# Patient Record
Sex: Male | Born: 1965 | Race: White | Hispanic: No | Marital: Married | Smoking: Never smoker
Health system: Southern US, Community
[De-identification: ages and names within clinical notes are randomized; demographics above are authoritative.]

## PROBLEM LIST (undated history)

## (undated) DIAGNOSIS — J45909 Unspecified asthma, uncomplicated: Secondary | ICD-10-CM

## (undated) DIAGNOSIS — D179 Benign lipomatous neoplasm, unspecified: Secondary | ICD-10-CM

## (undated) DIAGNOSIS — C189 Malignant neoplasm of colon, unspecified: Secondary | ICD-10-CM

## (undated) DIAGNOSIS — Z8719 Personal history of other diseases of the digestive system: Secondary | ICD-10-CM

## (undated) DIAGNOSIS — I340 Nonrheumatic mitral (valve) insufficiency: Secondary | ICD-10-CM

## (undated) DIAGNOSIS — J189 Pneumonia, unspecified organism: Secondary | ICD-10-CM

## (undated) DIAGNOSIS — Z9889 Other specified postprocedural states: Secondary | ICD-10-CM

## (undated) DIAGNOSIS — IMO0002 Reserved for concepts with insufficient information to code with codable children: Secondary | ICD-10-CM

## (undated) DIAGNOSIS — Z800 Family history of malignant neoplasm of digestive organs: Secondary | ICD-10-CM

## (undated) DIAGNOSIS — Z808 Family history of malignant neoplasm of other organs or systems: Secondary | ICD-10-CM

## (undated) DIAGNOSIS — Q600 Renal agenesis, unilateral: Secondary | ICD-10-CM

## (undated) DIAGNOSIS — J309 Allergic rhinitis, unspecified: Secondary | ICD-10-CM

## (undated) DIAGNOSIS — I10 Essential (primary) hypertension: Secondary | ICD-10-CM

## (undated) DIAGNOSIS — D1803 Hemangioma of intra-abdominal structures: Secondary | ICD-10-CM

## (undated) DIAGNOSIS — Z8739 Personal history of other diseases of the musculoskeletal system and connective tissue: Secondary | ICD-10-CM

## (undated) DIAGNOSIS — E785 Hyperlipidemia, unspecified: Secondary | ICD-10-CM

## (undated) DIAGNOSIS — R112 Nausea with vomiting, unspecified: Secondary | ICD-10-CM

## (undated) DIAGNOSIS — C649 Malignant neoplasm of unspecified kidney, except renal pelvis: Secondary | ICD-10-CM

## (undated) DIAGNOSIS — M4647 Discitis, unspecified, lumbosacral region: Secondary | ICD-10-CM

## (undated) DIAGNOSIS — I058 Other rheumatic mitral valve diseases: Secondary | ICD-10-CM

## (undated) DIAGNOSIS — I059 Rheumatic mitral valve disease, unspecified: Secondary | ICD-10-CM

## (undated) DIAGNOSIS — E119 Type 2 diabetes mellitus without complications: Secondary | ICD-10-CM

## (undated) HISTORY — DX: Essential (primary) hypertension: I10

## (undated) HISTORY — PX: THYROID SURGERY: SHX805

## (undated) HISTORY — DX: Benign lipomatous neoplasm, unspecified: D17.9

## (undated) HISTORY — PX: PORT-A-CATH REMOVAL: SHX5289

## (undated) HISTORY — PX: TONSILLECTOMY: SUR1361

## (undated) HISTORY — PX: TOTAL NEPHRECTOMY: SHX415

## (undated) HISTORY — DX: Nonrheumatic mitral (valve) insufficiency: I34.0

## (undated) HISTORY — PX: LIPOMA EXCISION: SHX5283

## (undated) HISTORY — DX: Family history of malignant neoplasm of other organs or systems: Z80.8

## (undated) HISTORY — DX: Family history of malignant neoplasm of digestive organs: Z80.0

## (undated) HISTORY — DX: Personal history of other diseases of the musculoskeletal system and connective tissue: Z87.39

## (undated) HISTORY — DX: Type 2 diabetes mellitus without complications: E11.9

## (undated) HISTORY — PX: KNEE SURGERY: SHX244

## (undated) HISTORY — DX: Hyperlipidemia, unspecified: E78.5

## (undated) HISTORY — DX: Allergic rhinitis, unspecified: J30.9

---
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
## 2004-07-15 ENCOUNTER — Ambulatory Visit: Payer: Self-pay | Admitting: Internal Medicine

## 2004-07-24 ENCOUNTER — Ambulatory Visit: Payer: Self-pay

## 2004-07-24 ENCOUNTER — Ambulatory Visit: Payer: Self-pay | Admitting: Internal Medicine

## 2004-09-06 ENCOUNTER — Ambulatory Visit: Payer: Self-pay | Admitting: Internal Medicine

## 2004-11-29 ENCOUNTER — Ambulatory Visit: Payer: Self-pay | Admitting: Internal Medicine

## 2004-12-05 ENCOUNTER — Ambulatory Visit: Payer: Self-pay | Admitting: Internal Medicine

## 2005-02-26 ENCOUNTER — Ambulatory Visit: Payer: Self-pay | Admitting: Internal Medicine

## 2005-03-05 ENCOUNTER — Ambulatory Visit: Payer: Self-pay | Admitting: Internal Medicine

## 2005-06-10 ENCOUNTER — Ambulatory Visit: Payer: Self-pay | Admitting: Internal Medicine

## 2005-06-13 ENCOUNTER — Ambulatory Visit: Payer: Self-pay | Admitting: Internal Medicine

## 2005-12-23 ENCOUNTER — Ambulatory Visit: Payer: Self-pay | Admitting: Internal Medicine

## 2006-06-05 ENCOUNTER — Ambulatory Visit: Payer: Self-pay | Admitting: Internal Medicine

## 2006-06-05 LAB — CONVERTED CEMR LAB
ALT: 46 units/L — ABNORMAL HIGH (ref 0–40)
AST: 34 units/L (ref 0–37)
Alkaline Phosphatase: 44 units/L (ref 39–117)
Bilirubin, Direct: 0.2 mg/dL (ref 0.0–0.3)
CRP, High Sensitivity: <1 — ABNORMAL LOW (ref 0.00–5.00)
Calcium: 9.3 mg/dL (ref 8.4–10.5)
Chloride: 100 meq/L (ref 96–112)
Cholesterol: 115 mg/dL (ref 0–200)
Creatinine, Ser: 1.2 mg/dL (ref 0.4–1.5)
GFR calc Af Amer: 87 mL/min
GFR calc non Af Amer: 72 mL/min
Glucose, Bld: 103 mg/dL — ABNORMAL HIGH (ref 70–99)
Hgb A1c MFr Bld: 6.1 % — ABNORMAL HIGH (ref 4.6–6.0)
LDL Cholesterol: 66 mg/dL (ref 0–99)
Potassium: 4.4 meq/L (ref 3.5–5.1)
Sodium: 138 meq/L (ref 135–145)
Total Bilirubin: 1.5 mg/dL — ABNORMAL HIGH (ref 0.3–1.2)
Total Protein: 7 g/dL (ref 6.0–8.3)
Triglycerides: 58 mg/dL (ref 0–149)

## 2006-06-15 ENCOUNTER — Ambulatory Visit: Payer: Self-pay | Admitting: Internal Medicine

## 2006-11-10 ENCOUNTER — Ambulatory Visit: Payer: Self-pay | Admitting: Internal Medicine

## 2006-11-10 LAB — CONVERTED CEMR LAB
Calcium: 9 mg/dL (ref 8.4–10.5)
Cholesterol: 131 mg/dL (ref 0–200)
Creatinine, Ser: 1.1 mg/dL (ref 0.4–1.5)
GFR calc Af Amer: 96 mL/min
GFR calc non Af Amer: 79 mL/min
HDL: 41.7 mg/dL (ref >39.0)
Hgb A1c MFr Bld: 5.6 % (ref 4.6–6.0)
Total Bilirubin: 1.4 mg/dL — ABNORMAL HIGH (ref 0.3–1.2)
VLDL: 10 mg/dL (ref 0–40)

## 2006-12-07 ENCOUNTER — Ambulatory Visit: Payer: Self-pay | Admitting: Internal Medicine

## 2007-03-02 ENCOUNTER — Encounter: Admission: RE | Admit: 2007-03-02 | Discharge: 2007-03-02 | Payer: Self-pay | Admitting: Family Medicine

## 2007-05-26 ENCOUNTER — Ambulatory Visit: Payer: Self-pay | Admitting: Internal Medicine

## 2007-05-26 LAB — CONVERTED CEMR LAB
ALT: 36 units/L (ref 0–53)
AST: 28 units/L (ref 0–37)
Albumin: 4 g/dL (ref 3.5–5.2)
Alkaline Phosphatase: 42 units/L (ref 39–117)
Bilirubin, Direct: 0.2 mg/dL (ref 0.0–0.3)
CO2: 31 meq/L (ref 19–32)
Calcium: 9.2 mg/dL (ref 8.4–10.5)
Chloride: 101 meq/L (ref 96–112)
Creatinine, Ser: 1.3 mg/dL (ref 0.4–1.5)
GFR calc Af Amer: 79 mL/min
GFR calc non Af Amer: 65 mL/min
Glucose, Bld: 97 mg/dL (ref 70–99)
LDL Cholesterol: 74 mg/dL (ref 0–99)
Sodium: 137 meq/L (ref 135–145)
Total Protein: 7.1 g/dL (ref 6.0–8.3)

## 2007-06-01 ENCOUNTER — Ambulatory Visit: Payer: Self-pay | Admitting: Internal Medicine

## 2007-11-04 ENCOUNTER — Encounter: Admission: RE | Admit: 2007-11-04 | Discharge: 2007-11-04 | Payer: Self-pay | Admitting: General Surgery

## 2008-01-14 ENCOUNTER — Ambulatory Visit: Payer: Self-pay | Admitting: Internal Medicine

## 2008-01-14 LAB — CONVERTED CEMR LAB
ALT: 36 units/L (ref 0–53)
AST: 28 units/L (ref 0–37)
Chloride: 103 meq/L (ref 96–112)
Cholesterol: 134 mg/dL (ref 0–200)
Glucose, Bld: 115 mg/dL — ABNORMAL HIGH (ref 70–99)
HDL: 38.4 mg/dL — ABNORMAL LOW (ref >39.0)
Hgb A1c MFr Bld: 6.2 % — ABNORMAL HIGH (ref 4.6–6.0)
Total Protein: 6.8 g/dL (ref 6.0–8.3)
VLDL: 13 mg/dL (ref 0–40)

## 2008-01-24 ENCOUNTER — Ambulatory Visit: Payer: Self-pay | Admitting: Internal Medicine

## 2008-03-12 DIAGNOSIS — R002 Palpitations: Secondary | ICD-10-CM | POA: Insufficient documentation

## 2008-03-12 DIAGNOSIS — I10 Essential (primary) hypertension: Secondary | ICD-10-CM | POA: Insufficient documentation

## 2008-08-15 ENCOUNTER — Ambulatory Visit: Payer: Self-pay | Admitting: Internal Medicine

## 2008-08-15 LAB — CONVERTED CEMR LAB
Albumin: 4 g/dL (ref 3.5–5.2)
Alkaline Phosphatase: 40 units/L (ref 39–117)
Bilirubin, Direct: 0.2 mg/dL (ref 0.0–0.3)
CO2: 32 meq/L (ref 19–32)
Calcium: 9.2 mg/dL (ref 8.4–10.5)
Chloride: 102 meq/L (ref 96–112)
Cholesterol: 128 mg/dL (ref 0–200)
Creatinine, Ser: 1.2 mg/dL (ref 0.4–1.5)
Hgb A1c MFr Bld: 6.3 % (ref 4.6–6.5)
LDL Cholesterol: 74 mg/dL (ref 0–99)
Potassium: 4.2 meq/L (ref 3.5–5.1)
Total Bilirubin: 1.5 mg/dL — ABNORMAL HIGH (ref 0.3–1.2)
Triglycerides: 88 mg/dL (ref 0.0–149.0)

## 2008-08-24 ENCOUNTER — Encounter (INDEPENDENT_AMBULATORY_CARE_PROVIDER_SITE_OTHER): Payer: Self-pay | Admitting: *Deleted

## 2008-08-30 ENCOUNTER — Encounter: Payer: Self-pay | Admitting: Internal Medicine

## 2008-08-31 ENCOUNTER — Ambulatory Visit: Payer: Self-pay | Admitting: Internal Medicine

## 2008-11-03 ENCOUNTER — Encounter: Admission: RE | Admit: 2008-11-03 | Discharge: 2008-11-03 | Payer: Self-pay | Admitting: General Surgery

## 2008-11-28 ENCOUNTER — Other Ambulatory Visit: Admission: RE | Admit: 2008-11-28 | Discharge: 2008-11-28 | Payer: Self-pay | Admitting: Interventional Radiology

## 2008-11-28 ENCOUNTER — Encounter: Admission: RE | Admit: 2008-11-28 | Discharge: 2008-11-28 | Payer: Self-pay | Admitting: General Surgery

## 2008-11-28 ENCOUNTER — Encounter (INDEPENDENT_AMBULATORY_CARE_PROVIDER_SITE_OTHER): Payer: Self-pay | Admitting: Interventional Radiology

## 2008-11-30 ENCOUNTER — Telehealth: Payer: Self-pay | Admitting: Internal Medicine

## 2008-12-13 ENCOUNTER — Ambulatory Visit: Payer: Self-pay

## 2008-12-13 ENCOUNTER — Ambulatory Visit: Payer: Self-pay | Admitting: Internal Medicine

## 2009-01-05 ENCOUNTER — Encounter (INDEPENDENT_AMBULATORY_CARE_PROVIDER_SITE_OTHER): Payer: Self-pay | Admitting: General Surgery

## 2009-01-05 ENCOUNTER — Ambulatory Visit (HOSPITAL_COMMUNITY): Admission: RE | Admit: 2009-01-05 | Discharge: 2009-01-06 | Payer: Self-pay | Admitting: General Surgery

## 2009-01-05 HISTORY — PX: THYROIDECTOMY, PARTIAL: SHX18

## 2009-04-24 ENCOUNTER — Ambulatory Visit: Payer: Self-pay | Admitting: Internal Medicine

## 2009-04-27 ENCOUNTER — Ambulatory Visit: Payer: Self-pay | Admitting: Internal Medicine

## 2009-04-27 LAB — CONVERTED CEMR LAB
Albumin: 3.9 g/dL (ref 3.5–5.2)
Alkaline Phosphatase: 42 units/L (ref 39–117)
Chloride: 102 meq/L (ref 96–112)
Cholesterol: 139 mg/dL (ref 0–200)
GFR calc non Af Amer: 70.63 mL/min (ref >60)
Glucose, Bld: 122 mg/dL — ABNORMAL HIGH (ref 70–99)
HDL: 45.1 mg/dL (ref >39.00)
Hgb A1c MFr Bld: 6.4 % (ref 4.6–6.5)
LDL Cholesterol: 80 mg/dL (ref 0–99)
Potassium: 4.1 meq/L (ref 3.5–5.1)
Sodium: 138 meq/L (ref 135–145)
Total Bilirubin: 1.8 mg/dL — ABNORMAL HIGH (ref 0.3–1.2)
Triglycerides: 69 mg/dL (ref 0.0–149.0)

## 2010-04-30 NOTE — Letter (Signed)
Summary: [facility] - Lipid  [facility], Main Office  [address]   [place], [place] [address]   Phone: [phone]  Fax: [phone]     April 27, 2009 MRN: [id number]   [name] [address] [place], [place]  [address]   Dear [name],  We have reviewed your cholesterol results.  They are as follows:     Total Cholesterol:    139 (Desirable: less than 200)       HDL  Cholesterol:     45.10  (Desirable: greater than 40 for men and 50 for women)       LDL Cholesterol:       80  (Desirable: less than 100 for low risk and less than 70 for moderate to high risk)       Triglycerides:       69.0  (Desirable: less than 150)  Our recommendations include:  Looks good conintue current medications   Call our office at the number listed above if you have any questions.  Lowering your LDL cholesterol is important, but it is only one of a large number of "risk factors" that may indicate that you are at risk for heart disease, stroke or other complications of hardening of the arteries.  Other risk factors include:   A.  Cigarette Smoking* B.  High Blood Pressure* C.  Obesity* D.   Low HDL Cholesterol (see yours above)* E.   Diabetes Mellitus (higher risk if your is uncontrolled) F.  Family history of premature heart disease G.  Previous history of stroke or cardiovascular disease    *These are risk factors YOU HAVE CONTROL OVER.  For more information, visit .  There is now evidence that lowering the TOTAL CHOLESTEROL AND LDL CHOLESTEROL can reduce the risk of heart disease.  The American Heart Association recommends the following guidelines for the treatment of elevated cholesterol:  1.  If there is now current heart disease and less than two risk factors, TOTAL CHOLESTEROL should be less than 200 and LDL CHOLESTEROL should be less than 100. 2.  If there is current heart disease or two or more risk factors, TOTAL CHOLESTEROL should be less than 200 and  LDL CHOLESTEROL should be less than 70.  A diet low in cholesterol, saturated fat, and calories is the cornerstone of treatment for elevated cholesterol.  Cessation of smoking and exercise are also important in the management of elevated cholesterol and preventing vascular disease.  Studies have shown that 30 to 60 minutes of physical activity most days can help lower blood pressure, lower cholesterol, and keep your weight at a healthy level.  Drug therapy is used when cholesterol levels do not respond to therapeutic lifestyle changes (smoking cessation, diet, and exercise) and remains unacceptably high.  If medication is started, it is important to have you levels checked periodically to evaluate the need for further treatment options.  Thank you,    [employer] Team

## 2010-04-30 NOTE — Assessment & Plan Note (Signed)
Summary: rov per pt call/jss      Allergies Added: NKDA  Visit Type:  Follow-up Primary Provider:  [facility]  CC:  no complaints.  History of Present Illness: [name] is a delightful 45-year-old male with history of hypertension, hyperlipidemia, and palpitations who returns for routine followup.  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 He presents to do routine followup.   Had stress test  in Sept 2010. 12:33 on [facility]. No ST-T changes. Recently had partial thyroidectomy for nodules.  Moving to [place] for work for a couple years. Comes in for f/u before leaving.  Exercising every day at lunch on treadmill or elliptical and weights. Feels great. Watching diet. Has lost about 2 pounds. Having problems with knee and back pain.  Taking BPs at home with systolics 115-120.    Current Medications (verified): 1)  Lisinopril 10 Mg Tabs (Lisinopril) .... Take One Tablet By Mouth Daily 2)  Toprol Xl 25 Mg Xr24h-Tab (Metoprolol Succinate) .... Take One Tablet By Mouth Once Daily. 3)  Hydrochlorothiazide 25 Mg Tabs (Hydrochlorothiazide) .... Take One Tablet By Mouth Daily. 4)  Lipitor 10 Mg Tabs (Atorvastatin Calcium) .... Take One Tablet By Mouth Daily. 5)  Aspirin 81 Mg Tbec (Aspirin) .... Take One Tablet By Mouth Daily 6)  Fish Oil   Oil (Fish Oil) .... 3 Caps Daily 7)  Multivitamins   Tabs (Multiple Vitamin) .... Once Daily  Allergies (verified): No Known Drug Allergies  Past History:  Past Medical History: Hyperlipidemia (intolerant to Niaspan due to extreme flushing) Hypertension Palpitations h/o Wilm's Tumor of L kidney      --s/p L kidney resection. 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 No CP or ST-T changes  Review of Systems       As per HPI and past medical history; otherwise all systems negative.   Vital Signs:  Patient profile:   45 year old male Height:      73 inches Weight:      213 pounds Pulse rate:   58 / minute BP sitting:   128 / 72  (left arm) Cuff size:   regular  Vitals Entered By: Burnett Kanaris, CNA (April 24, 2009 9:44 AM)  Physical Exam  General:  He is well-appearing in no acute distress.  He ambulates around the clinic without any respiratory difficulty HEENT is normal. Neck is supple.  No JVD.  Carotids are 2+ bilaterally without any bruits.  There is no lymphadenopathy. Thyroid scar CARDIAC:  PMI is nondisplaced.  He is bradycardic and regular with soft S4. His S2 is physiologically split.  There is no murmur. LUNGS:  Clear. ABDOMEN:  Soft, nontender, nondistended.  There is no hepatosplenomegaly, no bruits, no masses.  Good bowel sounds. EXTREMITIES:  Warm.  No cyanosis, clubbing or edema.  There are multiple lipomas as well as some atrophy of his left rhomboid muscle. NEURO:  He is alert and oriented x3.  Cranial nerves II-XII intact. Moves all four extremities without difficulty.  Affect is pleasant.     Impression & Recommendations:  Problem # 1:  HYPERTENSION, BENIGN (ICD-401.1) Blood pressure well controlled. Continue current regimen.  Problem # 2:  HYPERLIPIDEMIA-MIXED (ICD-272.4) Due for recheck.  Goal LDL < 100.  Problem # 3:  SCREENING FOR ISCHEMIC HEART DISEASE (ICD-V81.0) ETT normal with good exercise tolerance. No further testing at this time. Continue RF management.  Other Orders: EKG w/ Interpretation (93000)  Patient Instructions: 1)  Your physician recommends that you return for a FASTING lipid profile: on Fri 04/27/09

## 2010-07-04 LAB — T3: T3, Total: 107.6 ng/dl (ref 80.0–204.0)

## 2010-07-04 LAB — CBC
Hemoglobin: 16.6 g/dL (ref 13.0–17.0)
MCHC: 34.5 g/dL (ref 30.0–36.0)
MCV: 96 fL (ref 78.0–100.0)
Platelets: 138 10*3/uL — ABNORMAL LOW (ref 150–400)
RBC: 5.01 MIL/uL (ref 4.22–5.81)

## 2010-07-04 LAB — BASIC METABOLIC PANEL
CO2: 31 mEq/L (ref 19–32)
Calcium: 9.7 mg/dL (ref 8.4–10.5)
GFR calc non Af Amer: >60 mL/min (ref >60)
Glucose, Bld: 113 mg/dL — ABNORMAL HIGH (ref 70–99)
Sodium: 139 mEq/L (ref 135–145)

## 2010-07-04 LAB — DIFFERENTIAL
Eosinophils Relative: 11 % — ABNORMAL HIGH (ref 0–5)
Lymphocytes Relative: 18 % (ref 12–46)
Neutro Abs: 4.2 10*3/uL (ref 1.7–7.7)

## 2010-08-13 NOTE — Assessment & Plan Note (Signed)
[facility]                            CARDIOLOGY OFFICE NOTE   NAME:JONESEbrahim, Deremer                       MRN:          [id number]  DATE:06/01/2007                            DOB:          [birth year]    PRIMARY CARE PHYSICIAN:  Joycelyn Rua, M.D.   INTERVAL HISTORY:  Mr. Venus is a delightful 45 year old male with a  history hypertension, hyperlipidemia and palpitations who presents for  routine followup. Medical history is also notable for history of Wilms  tumor of the left kidney status post resection as a young child,  recurrent lymphomas and also history of glucose intolerance with a  hemoglobin A1c in the 5.5 to 6 range.  He presents today for routine  followup.   Overall, he is doing very well.  He has been very active though he has  changed a lot of his aerobic exercise over to core strength exercises.  He has been traveling a lot and it has been hard for him to watch his  diet as well.  He denies any chest pain, shortness of breath and his  palpitations have been much better.   CURRENT MEDICATIONS:  1. Lisinopril 10 mg a day.  2. Multivitamin.  3. Toprol XL 25 a day.  4. Hydrochlorothiazide 25 a day.  5. Lipitor 10 a day.  6. Fish oil 3 tablets daily.  7. Aspirin 81 mg a day.   DRUG INTOLERANCES:  NIASPAN DUE TO SEVERE FLUSHING.   PHYSICAL EXAM:  He is well-appearing in no acute distress.  He ambulates  around the clinic without any respiratory difficulty.  Blood pressure is  112/72, heart rate 56, weight 209.  HEENT is normal.  Neck is supple.  No JVD.  Carotids are 2+ bilaterally without any  bruits.  There is no lymphadenopathy or thyromegaly.  CARDIAC:  PMI is nondisplaced.  He is bradycardic and regular with soft  S4. His S2 is physiologically split.  There is no murmur.  LUNGS:  Clear.  ABDOMEN:  Soft, nontender, nondistended.  There is no  hepatosplenomegaly, no bruits, no masses.  Good bowel sounds.  EXTREMITIES:   Warm.  No cyanosis, clubbing or edema.  There are multiple  lipomas as well as some atrophy of his left rhomboid muscle.  NEURO:  He is alert and oriented x3.  Cranial nerves II-XII intact.  Moves all four extremities without difficulty.  Affect is pleasant.   EKG shows sinus bradycardia rate of 56 with interventricular conduction  delay at 110 milliseconds.  No ST-T wave abnormalities.  Sodium is 137,  potassium 4.4, BUN 19, creatinine 1.3.  LFTs are normal.  Cholesterol is  119, triglycerides 44, HDL 36 and LDL 74.  Glucose is 97.   ASSESSMENT/PLAN:  1. Hypertension,  well-controlled.  Continue current therapy.  2. Hyperlipidemia. Lipids look good. HDL is just mildly low, but would      continue current therapy.  HE IS INTOLERANT NIASPAN.  His CRP is      less than 1.  3. Glucose intolerance. Fasting glucose is now under 100.  I  congratulated him on this and he will continue efforts I keeping      his weight down and exercise.   DISPOSITION:  Will see him back and 6 months with a repeat of his  cholesterol.     Bevelyn Buckles. Bensimhon, MD  Electronically Signed    DRB/MedQ  DD: 06/01/2007  DT: 06/01/2007  Job #: [id number]

## 2010-08-13 NOTE — Assessment & Plan Note (Signed)
[facility]                            CARDIOLOGY OFFICE NOTE   NAME:[name], [name]                       MRN:          [id number]  DATE:12/07/2006                            DOB:          1965-08-31    PRIMARY CARE PHYSICIAN:  Joycelyn Rua, M.D. at [facility]   INTERVAL HISTORY:  [name] is a delightful 45 year old male with a  history of hypertension, hyperlipidemia, and palpitations, who presents  for a routine followup.  Medical history is also notable for a history  of Wilm's tumor of the left kidney status post resection as a young  child.  He also has a history of glucose intolerance with a hemoglobin  A1c of 5.6.  He presents today for routine followup.   He is doing very well.  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  He is doing an intense regimen  of push-ups and sit-ups.  On other days, he continues with his aerobic  workouts with 30 minutes on an elliptical trainer and 15 minutes on the  bike.  He denies any chest pain.  He says when he works out very hard,  he does get some very mild shortness of breath.   CURRENT MEDICATIONS:  1. Lisinopril 10 a day.  2. Multivitamin.  3. Toprol XL 25 a day.  4. Hydrochlorothiazide 25 a day.  5. Lipitor 10 a day.  6. Fish oil 3 tablets a day.   DRUG INTOLERANCES:  NIASPAN due to severe flushing.   PHYSICAL EXAM:  He is well-appearing.  In no acute distress.  Ambulates  around the clinic without any respiratory difficulty.  Blood pressure 110/80, heart rate is 52, weight 204 pounds.  HEENT:  Normal.  NECK:  Supple.  No JVD.  Carotids are 2+ bilaterally without bruits.  There is no lymphadenopathy or thyromegaly.  CARDIAC:  PMI is not displaced.  He is bradycardic and regular with a  soft S4.  No murmur.  LUNGS:  Clear.  ABDOMEN:  Soft, nontender, nondistended.  No hepatosplenomegaly.  No  bruits.  No masses. Good bowel sounds.  EXTREMITIES:  Warm with no cyanosis, clubbing, or edema.  There are  multiple lipomas as well as some atrophy of his left rhomboid muscle.  NEUROLOGIC:  He is alert and oriented x3.  Cranial nerves 2-12 are  intact.  Moves all 4 extremities without difficulty.  Affect is  pleasant.   LABS:  Show a creatinine of 1.1, glucose 111, AST 30, ALT 32, total  cholesterol 131, triglycerides 51, HDL 42, and LDL 79.  Hemoglobin A1c  remains at 5.6.   ASSESSMENT AND PLAN:  1. Hypertension, well controlled.  Continue current regimen.  2. Hyperlipidemia.  All his numbers are at goal.  They look great.  I      have commended him on his exercise program.  3. Glucose intolerance.  This is stable.  No evidence of overt      diabetes.  He will follow up with his primary care physician.  4.  Dyspnea and cardiovascular screening.  He does have some very      minimal dyspnea on heavy exertion.  Given that he is about to turn      42, I think it is very reasonable to proceed with a screening      cardiopulmonary exercise test to evaluate the dyspnea, and also an      echocardiogram.  We will get these over the next several months.      Should his symptoms progress we can do it sooner.   DISPOSITION:  Return to clinic in 5 to 6 months for routine followup.     Bevelyn Buckles. Bensimhon, MD  Electronically Signed    DRB/MedQ  DD: 12/07/2006  DT: 12/08/2006  Job #: [id number]   cc:   Joycelyn Rua, M.D.

## 2010-08-13 NOTE — Assessment & Plan Note (Signed)
[facility]                            CARDIOLOGY OFFICE NOTE   NAME:[name], [name]                       MRN:          [id number]  DATE:01/24/2008                            DOB:          Feb 01, 1966    PRIMARY CARE PHYSICIAN:  David Rua, MD   INTERVAL HISTORY:  [name] is a delightful 45 year old male with history of  hypertension, hyperlipidemia, and palpitations who returns for routine  followup.  His history is also notable for a Wilms tumor of the left  kidney status post resection as a young child, recurrent lipomas, and a  history of glucose intolerance with a hemoglobin A1c in the 5.5-6 range.  He presents to do routine followup.   Overall, he is doing well.  Unfortunately, he has not been able to  exercise as much as possible, as they have a 48-month-old at home.  However, he has been doing his best to watch his diet.  He has gained a  few pounds.  He denies any chest pain, shortness of breath, or  palpitations.   CURRENT MEDICATIONS:  1. Lisinopril 10 mg a day.  2. Multivitamin.  3. Toprol-XL 25 a day.  4. Hydrochlorothiazide 25 a day.  5. Lipitor 10 a day.  6. Fish oil 3 g a day.  7. Aspirin 81 a day.   PHYSICAL EXAMINATION:  GENERAL:  He is well appearing, in no acute  distress, ambulates around the clinic without any respiratory  difficulty.  VITAL SIGNS:  Blood pressure is 112/70, heart rate 67, and weight is  213.  HEENT:  Normal.  NECK:  Supple.  No JVD.  Carotids are 2+ bilaterally without any bruits.  There is no lymphadenopathy or thyromegaly.  CARDIAC:  PMI is nondisplaced.  Regular rate and rhythm with an S4.  No  murmur.  LUNGS:  Clear.  ABDOMEN:  Soft, nontender, and nondistended.  No hepatosplenomegaly, no  bruits, no masses.  Good bowel sounds.  EXTREMITIES:  Warm with no cyanosis, clubbing, or edema.  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  NEUROLOGIC:  Alert and oriented x3.  Cranial nerves II through XII are  intact.  Moves all 4 extremities without difficulty.  Affect is  pleasant.   EKG shows sinus rhythm at a rate of 60.  He has a nonspecific  intraventricular conduction delay with the QRS of 180 milliseconds.   LABORATORY DATA:  Fasting glucose is 115.  LFTs are normal.  Cholesterol  is 134, triglycerides 64, HDL 38, and LDL 82.  Hemoglobin A1c is up at  6.2.   ASSESSMENT AND PLAN:  1. Hypertension, well controlled.  2. Hyperlipidemia.  Lipids looked okay.  HDL is still a little bit      low.  3. Glucose intolerance.  His hemoglobin A1c is mildly elevated.  I      have asked him to re-double his efforts with exercise and also      watch his carbohydrate intake.   DISPOSITION:  Overall, [name] is doing pretty well.  We will see him  back in 6 months with a repeat cholesterol panel as well as a hemoglobin  A1c and CRP.     David Buckles. Bensimhon, MD  Electronically Signed    DRB/MedQ  DD: 01/24/2008  DT: 01/25/2008  Job #: [id number]   cc:   [name], M.D.

## 2010-08-16 NOTE — Assessment & Plan Note (Signed)
[facility]                            CARDIOLOGY OFFICE NOTE   NAME:JONESSevag, [name]                       MRN:          [id number]  DATE:06/15/2006                            DOB:          [name]    INTERVAL HISTORY:  [name] is a delightful 45 year old male with a  history of hypertension, hyperlipidemia, and palpitations who presents  for a routine followup.  Medical history is also notable for a history  of Wilhelm's tumor of the left kidney, status post resection as a young  child.  He also has a history of glucose intolerance with a previous  hemoglobin A1c of 5.6 and recurrent lipomas.   He presents today for routine followup.  He has been doing quite well.  He has been exercising routinely doing more cardio and less weights and  feels good.  No chest pain or shortness of breath.  He has been very  vigilant about watching his diet.   CURRENT MEDICATIONS:  1. Lisinopril 10 mg a day.  2. Multivitamin.  3. Toprol XL 25 a day.  4. HCTZ 25 a day.  5. Lipitor 10 a day.  6. Fish oil.   DRUG INTOLERANCES:  NIASPAN due to severe flushing.   PHYSICAL EXAMINATION:  He is well-appearing in no acute distress.  Blood pressure 117/72, heart rate 63, weight is 203.  HEENT:  Sclerae anicteric.  EOMI.  There are no xanthelasmas.  Mucous  membranes are moist.  NECK:  Supple, there is no JVD.  Carotids are 2+ bilaterally without  bruits. There is no lymphadenopathy or thyromegaly.  CARDIAC:  Regular rate and rhythm with a soft S4.  No murmurs or rubs.  LUNGS:  Clear with normal respiratory effort.  ABDOMEN:  Soft, nontender, nondistended.  No hepatosplenomegaly, no  bruits, no masses.  Good bowel sounds.  EXTREMITIES:  Warm with no clubbing, cyanosis, or edema.  There are  multiple lipomas as well as well as some atrophy of his rhomboid  muscles.  NEUROLOGIC:  He is alert and oriented x3.  Cranial nerves II-XII are  intact.  He moves all 4  extremities without difficulty.  Affect is very  pleasant.   LABS:  Show a glucose of 103.  AST of 34, ALT of 46, total cholesterol  of 115, triglycerides 58, HDL 38, LDL 56.  CRP less than 1, and  Hemoglobin A1c is 6.1.   ASSESSMENT AND PLAN:  1. Palpitations and flushing is much improved, continue beta blocker.  2. Hypertension under good control.  3. Hyperlipidemia--lipids look good, continue current therapy.  4. Glucose intolerance his hemoglobin A1c has bumped up slightly      despite his best efforts.  I told him that likely he will be a      diabetic in the next few years.  I continued to encourage him to be      as aggressive as he can with his diet and exercise program.  We      will recheck his hemoglobin A1c in the next few months.  We David  need to consider metformin.     Bevelyn Buckles. Bensimhon, MD  Electronically Signed    DRB/MedQ  DD: 06/15/2006  DT: 06/16/2006  Job #: [id number]

## 2010-08-16 NOTE — Assessment & Plan Note (Signed)
[facility]                              CARDIOLOGY OFFICE NOTE   NAME:[name]                       MRN:          [id number]  DATE:12/23/2005                            DOB:          07-Jul-1965    PATIENT IDENTIFICATION:  [name] is a delightful 45 year old male who  presents for routine followup.   PROBLEM LIST:  1. History of Wilms tumor of the left kidney, status post kidney resection      at age 72-1/2, also been treated with radiation and chemotherapy.  2. History of palpitations and syncope, possibly secondary to vasovagal      episodes.      a.     Echocardiogram 2006,ejection fraction 60%, mild mitral valve       prolapse, trace mitral regurgitation.  3. Hypertension.  4. Hyperlipidemia.      a.     Intolerant of Niaspan secondary to extreme flushing.  5. Glucose intolerance.  Fasting glucose as high as 119, hemoglobin A1C      5.6.  6. 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  7. History of recurrent lipomas.   CURRENT MEDICATIONS:  1. Lisinopril 10 mg.  2. Multivitamin.  3. Toprol XL 25 mg.  4. Hydrochlorothiazide 25 mg.  5. Lipitor 10 mg.  6. Fish oil.   ALLERGIES:  No known drug allergies.   INTERVAL HISTORY:  [name] returns today for routine followup.  Since I last  saw him he continues to be very aggressive about his exercise and diet  program. He is in very good shape.  He is watching his portion size very  closely.  He has not had any significant episodes of presyncope or  palpitations.  He denies any chest pain or shortness of breath.   PHYSICAL EXAMINATION:  GENERAL:  He is well-appearing, in no acute distress.  VITAL SIGNS:  Blood pressure is 106/50, heart rate is 60.  He ambulates  around the clinic without any respiratory distress.  HEENT:  Sclerae are anicteric, EOMI.  There is no xanthelasma.  Mucous  membranes are moist.  NECK:  Supple.  There is no JVD.  Carotids are 2+  bilaterally without any  bruits.  There is no lymphadenopathy or thyromegaly.  CARDIAC:  Regular rate and rhythm, with no murmurs, rubs or gallops.  LUNGS:  Clear.  ABDOMEN:  Soft, nontender, nondistended.  There is no hepatosplenomegaly, no  bruits, no masses, good bowel sounds.  EXTREMITIES:  Warm with no cyanosis, clubbing or edema.  There are multiple  lipomas.  NEUROLOGIC:  He is alert and oriented x3.  He has a very pleasant affect.  Cranial nerves II-XII are intact.  He moves all 4 extremities without  difficulty.   ACCESSORY DATA:  Sodium 141, potassium 4.4, glucose of 117, BUN 21,  creatinine 1.3.  LFTs are normal.  Total cholesterol 113, triglycerides 38,  HDL 36, LDL 69.   ASSESSMENT AND PLAN:  1. Palpitations and presyncope.  This is resolved.  Continue beta blocker.  2. Hyperlipidemia.  He has made a dramatic improvement  with exercise and      weight loss.  We will continue Lipitor.  His HDL remains slightly low,      but he has been intolerant of Niaspan.  I think overall his risk is      quite low, and I would not pursue this further at this time.  3. Hypertension, well controlled.  4. Glucose intolerance.  This seems a bit refractory, but he has not      progressed.  We will continue to follow.   DISPOSITION:  Return to clinic in several months for routine followup.  We  will check an NMR lipid panel at that time.                                Bevelyn Buckles. Bensimhon, MD    DRB/MedQ  DD:  12/23/2005  DT:  12/25/2005  Job #:  [id number]

## 2013-03-31 HISTORY — PX: LIPOMA EXCISION: SHX5283

## 2013-04-05 ENCOUNTER — Encounter (INDEPENDENT_AMBULATORY_CARE_PROVIDER_SITE_OTHER): Payer: Self-pay

## 2013-04-05 ENCOUNTER — Ambulatory Visit (INDEPENDENT_AMBULATORY_CARE_PROVIDER_SITE_OTHER): Payer: BC Managed Care – PPO | Admitting: Interventional Cardiology

## 2013-04-05 ENCOUNTER — Encounter: Payer: Self-pay | Admitting: Interventional Cardiology

## 2013-04-05 VITALS — BP 120/90 | HR 98 | Ht 73.0 in | Wt 215.0 lb

## 2013-04-05 DIAGNOSIS — Z8249 Family history of ischemic heart disease and other diseases of the circulatory system: Secondary | ICD-10-CM | POA: Insufficient documentation

## 2013-04-05 DIAGNOSIS — I10 Essential (primary) hypertension: Secondary | ICD-10-CM

## 2013-04-05 DIAGNOSIS — E785 Hyperlipidemia, unspecified: Secondary | ICD-10-CM

## 2013-04-05 NOTE — Patient Instructions (Signed)
Your physician wants you to follow-up in: 1 year with Dr. Irish Lack. You will receive a reminder letter in the mail two months in advance. If you don't receive a letter, please call our office to schedule the follow-up appointment.  Your physician recommends that you continue on your current medications as directed. Please refer to the Current Medication list given to you today.

## 2013-04-05 NOTE — Progress Notes (Signed)
Patient ID: [name], male   DOB: Sep 10, 1965, 48 y.o.   MRN: [id number]     Patient ID: [name] MRN: [id number] DOB/AGE: 1965-09-16 48 y.o.    Referring Physician Dr. Lafonda Mosses   Reason for Consultation : Family h/o CAD  HPI: 48 y/o who has a family h/o  CAD at a young age.  He has had HTN for over 10 years.  BP is usually controlled on meds.  He has ben living in [place] for 4 years.  He works out regularly.  He does cardio 45 minutes total b/w biuke and elliptical and treadmill.  He has had knee issues.  No CP, SHOB.  He takes asthma medicine, related to allergies.  Pulse during workouts go to 135-140, up to 160.  No problems with exercise.    H ehad an echo in the past and was told he had a mildly leaky valve.  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  He is working on portion control.  He passed a stress test.  He had a thyroidectomy.    He was diagnosed with DM a few weeks ago and started medicine.    Family history includes father with CABG, bilateral CEA.  CABG at about 56.  Mother as low BP and thyroid cancer.  Sister has not had problems.  Paternal GM with stroke.     Current Outpatient Prescriptions  Medication Sig Dispense Refill  . atorvastatin (LIPITOR) 10 MG tablet Take 10 mg by mouth daily.      . hydrochlorothiazide (HYDRODIURIL) 25 MG tablet Take 25 mg by mouth daily.      Marland Kitchen lisinopril (PRINIVIL,ZESTRIL) 10 MG tablet Take 10 mg by mouth daily.      . metoprolol succinate (TOPROL-XL) 25 MG 24 hr tablet Take 25 mg by mouth daily.      . mometasone (ASMANEX) 220 MCG/INH inhaler Inhale 1 puff into the lungs daily.      . Multiple Vitamin (MULTIVITAMIN) tablet Take 1 tablet by mouth daily.      . Omega-3 Fatty Acids (FISH OIL CONCENTRATE) 1000 MG CAPS Take by mouth. daily      . repaglinide (PRANDIN) 0.5 MG tablet Take 0.5 mg by mouth 2 (two) times daily before a meal.       No current facility-administered medications for  this visit.   Past Medical History  Diagnosis Date  . Diabetes mellitus without complication     type 2  . Hyperlipidemia   . Allergic rhinitis   . lipoma   . H/O scoliosis   . Hypertension     Diagnostic exercise tolerance test assessment:04/30/2010 : comments normal -no evidence os ischemia by ST analysis    Family History  Problem Relation Age of Onset  . Heart disease Father     History   Social History  . Marital Status: Married    Spouse Name: N/A    Number of Children: N/A  . Years of Education: N/A   Occupational History  . executive    Social History Main Topics  . Smoking status: Never Smoker   . Smokeless tobacco: Not on file  . Alcohol Use: Yes     Comment: rare ,1-2 per week  . Drug Use: Not on file  . Sexual Activity: Not on file   Other Topics Concern  . Not on file   Social History Narrative   Never smoked    Alcohol yes, rare ,  1-2 per week   No recreational drugs   Occupation-  Geneticist, molecular and other executive courses   Marital status - married     Children 1 boy          Past Surgical History  Procedure Laterality Date  . Lipoma excision    . Total nephrectomy Left   . Thyroid surgery    . Knee surgery        (Not in a hospital admission)  Review of systems complete and found to be negative unless listed above .  No nausea, vomiting.  No fever chills, No focal weakness,  No palpitations.  Physical Exam: Filed Vitals:   04/05/13 0801  BP: 120/90  Pulse: 98    Weight: 215 lb (97.523 kg)  Physical exam:  [facility]/AT EOMI No JVD, No carotid bruit RRR S1S2  No wheezing Soft. NT, nondistended No edema.  Bilateral lipomas.; back assymmetric; left side without mucle No focal motor or sensory deficits Normal affect  Labs:   Lab Results  Component Value Date   WBC 6.8 01/02/2009   HGB 16.6 01/02/2009   HCT 48.2 01/02/2009   MCV 96.0 01/02/2009   PLT 138* 01/02/2009   No results found for this basename: NA, K, CL,  CO2, BUN, CREATININE, CALCIUM, LABALBU, PROT, BILITOT, ALKPHOS, ALT, AST, GLUCOSE,  in the last 168 hours No results found for this basename: CKTOTAL,  CKMB,  CKMBINDEX,  TROPONINI    Lab Results  Component Value Date   CHOL 139 04/27/2009   CHOL 128 08/15/2008   CHOL 134 01/14/2008   Lab Results  Component Value Date   HDL 45.10 04/27/2009   HDL 36.90* 08/15/2008   HDL 38.4* 01/14/2008   Lab Results  Component Value Date   LDLCALC 80 04/27/2009   LDLCALC 74 08/15/2008   LDLCALC 82 01/14/2008   Lab Results  Component Value Date   TRIG 69.0 04/27/2009   TRIG 88.0 08/15/2008   TRIG 67 01/14/2008   Lab Results  Component Value Date   CHOLHDL 3 04/27/2009   CHOLHDL 3 08/15/2008   CHOLHDL 3.5 CALC 01/14/2008   No results found for this basename: LDLDIRECT       EKG: Normal  ASSESSMENT AND PLAN:  Family h/o CAD: Several family members with cardiovascular disease. Stressed the importance of preventive therapy including blood pressure and lipid control. He exercises regularly. He will try to improve his diet to get to his target weight of 193 pounds.  He has gained some weight with his recent travel. He will be traveling to [place] for 6 months on a work assignment.  Continue aggressive diabetes control.  HTN: Controlled. I encouraged him to get this checked outside of the doctor's office at least a couple times a week. If his blood pressure is elevated, he should let us know. If it is normal, continue current medicines.   Hyperlipidemia:  Lipids reviewed and well controlled. LDL 73. HDL 49 in December 2014. Continue atorvastatin.  Signed:   Mina Marble, MD, [facility] 04/05/2013, 8:57 AM

## 2013-12-09 DIAGNOSIS — E7889 Other lipoprotein metabolism disorders: Secondary | ICD-10-CM | POA: Insufficient documentation

## 2014-02-26 ENCOUNTER — Encounter (HOSPITAL_COMMUNITY): Payer: Self-pay

## 2014-02-26 ENCOUNTER — Inpatient Hospital Stay (HOSPITAL_COMMUNITY)
Admission: EM | Admit: 2014-02-26 | Discharge: 2014-03-08 | DRG: 862 | Disposition: A | Discharge status: Rehabilitation Facility | Payer: BC Managed Care – PPO | Attending: Internal Medicine | Admitting: Internal Medicine

## 2014-02-26 ENCOUNTER — Emergency Department (HOSPITAL_COMMUNITY): Payer: BC Managed Care – PPO

## 2014-02-26 DIAGNOSIS — G9340 Encephalopathy, unspecified: Secondary | ICD-10-CM | POA: Diagnosis not present

## 2014-02-26 DIAGNOSIS — Z7982 Long term (current) use of aspirin: Secondary | ICD-10-CM

## 2014-02-26 DIAGNOSIS — I6340 Cerebral infarction due to embolism of unspecified cerebral artery: Secondary | ICD-10-CM | POA: Diagnosis present

## 2014-02-26 DIAGNOSIS — Z789 Other specified health status: Secondary | ICD-10-CM

## 2014-02-26 DIAGNOSIS — IMO0002 Reserved for concepts with insufficient information to code with codable children: Secondary | ICD-10-CM | POA: Diagnosis present

## 2014-02-26 DIAGNOSIS — M549 Dorsalgia, unspecified: Secondary | ICD-10-CM | POA: Diagnosis not present

## 2014-02-26 DIAGNOSIS — R509 Fever, unspecified: Secondary | ICD-10-CM

## 2014-02-26 DIAGNOSIS — E785 Hyperlipidemia, unspecified: Secondary | ICD-10-CM | POA: Diagnosis present

## 2014-02-26 DIAGNOSIS — R414 Neurologic neglect syndrome: Secondary | ICD-10-CM

## 2014-02-26 DIAGNOSIS — R569 Unspecified convulsions: Secondary | ICD-10-CM

## 2014-02-26 DIAGNOSIS — R4182 Altered mental status, unspecified: Secondary | ICD-10-CM

## 2014-02-26 DIAGNOSIS — J9600 Acute respiratory failure, unspecified whether with hypoxia or hypercapnia: Secondary | ICD-10-CM | POA: Diagnosis present

## 2014-02-26 DIAGNOSIS — T814XXA Infection following a procedure, initial encounter: Principal | ICD-10-CM | POA: Diagnosis present

## 2014-02-26 DIAGNOSIS — M4626 Osteomyelitis of vertebra, lumbar region: Secondary | ICD-10-CM | POA: Diagnosis present

## 2014-02-26 DIAGNOSIS — Z79891 Long term (current) use of opiate analgesic: Secondary | ICD-10-CM

## 2014-02-26 DIAGNOSIS — A419 Sepsis, unspecified organism: Secondary | ICD-10-CM | POA: Diagnosis present

## 2014-02-26 DIAGNOSIS — R61 Generalized hyperhidrosis: Secondary | ICD-10-CM | POA: Diagnosis present

## 2014-02-26 DIAGNOSIS — R6520 Severe sepsis without septic shock: Secondary | ICD-10-CM | POA: Diagnosis present

## 2014-02-26 DIAGNOSIS — B9689 Other specified bacterial agents as the cause of diseases classified elsewhere: Secondary | ICD-10-CM | POA: Diagnosis present

## 2014-02-26 DIAGNOSIS — R739 Hyperglycemia, unspecified: Secondary | ICD-10-CM | POA: Diagnosis present

## 2014-02-26 DIAGNOSIS — M545 Low back pain, unspecified: Secondary | ICD-10-CM

## 2014-02-26 DIAGNOSIS — M419 Scoliosis, unspecified: Secondary | ICD-10-CM | POA: Diagnosis present

## 2014-02-26 DIAGNOSIS — I330 Acute and subacute infective endocarditis: Secondary | ICD-10-CM | POA: Diagnosis present

## 2014-02-26 DIAGNOSIS — I10 Essential (primary) hypertension: Secondary | ICD-10-CM | POA: Diagnosis present

## 2014-02-26 DIAGNOSIS — I639 Cerebral infarction, unspecified: Secondary | ICD-10-CM | POA: Diagnosis present

## 2014-02-26 DIAGNOSIS — Z79899 Other long term (current) drug therapy: Secondary | ICD-10-CM

## 2014-02-26 DIAGNOSIS — Z7951 Long term (current) use of inhaled steroids: Secondary | ICD-10-CM

## 2014-02-26 DIAGNOSIS — R404 Transient alteration of awareness: Secondary | ICD-10-CM

## 2014-02-26 DIAGNOSIS — I76 Septic arterial embolism: Secondary | ICD-10-CM | POA: Diagnosis present

## 2014-02-26 DIAGNOSIS — J9690 Respiratory failure, unspecified, unspecified whether with hypoxia or hypercapnia: Secondary | ICD-10-CM

## 2014-02-26 DIAGNOSIS — Z905 Acquired absence of kidney: Secondary | ICD-10-CM

## 2014-02-26 DIAGNOSIS — Q600 Renal agenesis, unilateral: Secondary | ICD-10-CM

## 2014-02-26 DIAGNOSIS — D649 Anemia, unspecified: Secondary | ICD-10-CM | POA: Diagnosis present

## 2014-02-26 DIAGNOSIS — E1165 Type 2 diabetes mellitus with hyperglycemia: Secondary | ICD-10-CM | POA: Diagnosis present

## 2014-02-26 DIAGNOSIS — A4181 Sepsis due to Enterococcus: Secondary | ICD-10-CM | POA: Diagnosis present

## 2014-02-26 DIAGNOSIS — M4640 Discitis, unspecified, site unspecified: Secondary | ICD-10-CM | POA: Diagnosis present

## 2014-02-26 DIAGNOSIS — J9601 Acute respiratory failure with hypoxia: Secondary | ICD-10-CM | POA: Diagnosis present

## 2014-02-26 DIAGNOSIS — I429 Cardiomyopathy, unspecified: Secondary | ICD-10-CM

## 2014-02-26 DIAGNOSIS — Z8249 Family history of ischemic heart disease and other diseases of the circulatory system: Secondary | ICD-10-CM

## 2014-02-26 DIAGNOSIS — B952 Enterococcus as the cause of diseases classified elsewhere: Secondary | ICD-10-CM | POA: Diagnosis present

## 2014-02-26 DIAGNOSIS — R7881 Bacteremia: Secondary | ICD-10-CM | POA: Diagnosis present

## 2014-02-26 DIAGNOSIS — E872 Acidosis: Secondary | ICD-10-CM | POA: Diagnosis present

## 2014-02-26 HISTORY — DX: Renal agenesis, unilateral: Q60.0

## 2014-02-26 HISTORY — DX: Reserved for concepts with insufficient information to code with codable children: IMO0002

## 2014-02-26 HISTORY — DX: Malignant neoplasm of unspecified kidney, except renal pelvis: C64.9

## 2014-02-26 LAB — COMPREHENSIVE METABOLIC PANEL
ALBUMIN: 3 g/dL — AB (ref 3.5–5.2)
ALK PHOS: 66 U/L (ref 39–117)
ALT: 21 U/L (ref 0–53)
AST: 17 U/L (ref 0–37)
Anion gap: 16 — ABNORMAL HIGH (ref 5–15)
BUN: 16 mg/dL (ref 6–23)
CHLORIDE: 96 meq/L (ref 96–112)
CO2: 22 mEq/L (ref 19–32)
Calcium: 9 mg/dL (ref 8.4–10.5)
Creatinine, Ser: 0.83 mg/dL (ref 0.50–1.35)
GFR calc Af Amer: >90 mL/min (ref >90)
GFR calc non Af Amer: >90 mL/min (ref >90)
Glucose, Bld: 268 mg/dL — ABNORMAL HIGH (ref 70–99)
POTASSIUM: 3.7 meq/L (ref 3.7–5.3)
Sodium: 134 mEq/L — ABNORMAL LOW (ref 137–147)
Total Bilirubin: 0.6 mg/dL (ref 0.3–1.2)
Total Protein: 7.5 g/dL (ref 6.0–8.3)

## 2014-02-26 LAB — CBC WITH DIFFERENTIAL/PLATELET
BASOS PCT: 0 % (ref 0–1)
Basophils Absolute: 0 10*3/uL (ref 0.0–0.1)
EOS PCT: 0 % (ref 0–5)
Eosinophils Absolute: 0 10*3/uL (ref 0.0–0.7)
HEMATOCRIT: 39 % (ref 39.0–52.0)
Hemoglobin: 13.6 g/dL (ref 13.0–17.0)
LYMPHS PCT: 6 % — AB (ref 12–46)
Lymphs Abs: 0.7 10*3/uL (ref 0.7–4.0)
MCH: 31.1 pg (ref 26.0–34.0)
MCHC: 34.9 g/dL (ref 30.0–36.0)
MCV: 89.2 fL (ref 78.0–100.0)
MONO ABS: 0.9 10*3/uL (ref 0.1–1.0)
Monocytes Relative: 8 % (ref 3–12)
Neutro Abs: 9.3 10*3/uL — ABNORMAL HIGH (ref 1.7–7.7)
Neutrophils Relative %: 86 % — ABNORMAL HIGH (ref 43–77)
Platelets: 183 10*3/uL (ref 150–400)
RBC: 4.37 MIL/uL (ref 4.22–5.81)
RDW: 12.5 % (ref 11.5–15.5)
WBC: 10.9 10*3/uL — ABNORMAL HIGH (ref 4.0–10.5)

## 2014-02-26 LAB — URINALYSIS, ROUTINE W REFLEX MICROSCOPIC
Glucose, UA: 500 mg/dL — AB
Hgb urine dipstick: NEGATIVE
Ketones, ur: >80 mg/dL — AB
Leukocytes, UA: NEGATIVE
NITRITE: NEGATIVE
PH: 5.5 (ref 5.0–8.0)
Protein, ur: 30 mg/dL — AB
SPECIFIC GRAVITY, URINE: 1.028 (ref 1.005–1.030)
Urobilinogen, UA: 1 mg/dL (ref 0.0–1.0)

## 2014-02-26 LAB — URINE MICROSCOPIC-ADD ON

## 2014-02-26 MED ORDER — HYDROMORPHONE HCL 1 MG/ML IJ SOLN
1.0000 mg | Freq: Once | INTRAMUSCULAR | Status: AC
  Administered 2014-02-26: 1 mg via INTRAVENOUS
  Filled 2014-02-26: qty 1

## 2014-02-26 MED ORDER — OXYCODONE-ACETAMINOPHEN 5-325 MG PO TABS
2.0000 | ORAL_TABLET | Freq: Once | ORAL | Status: AC
  Administered 2014-02-26: 2 via ORAL
  Filled 2014-02-26: qty 2

## 2014-02-26 MED ORDER — KETOROLAC TROMETHAMINE 30 MG/ML IJ SOLN
30.0000 mg | Freq: Once | INTRAMUSCULAR | Status: AC
  Administered 2014-02-26: 30 mg via INTRAVENOUS
  Filled 2014-02-26: qty 1

## 2014-02-26 MED ORDER — DIAZEPAM 5 MG/ML IJ SOLN
5.0000 mg | Freq: Once | INTRAMUSCULAR | Status: AC
  Administered 2014-02-26: 5 mg via INTRAVENOUS
  Filled 2014-02-26: qty 2

## 2014-02-26 MED ORDER — CYCLOBENZAPRINE HCL 10 MG PO TABS
5.0000 mg | ORAL_TABLET | Freq: Once | ORAL | Status: AC
  Administered 2014-02-26: 5 mg via ORAL
  Filled 2014-02-26: qty 1

## 2014-02-26 MED ORDER — OXYCODONE-ACETAMINOPHEN 5-325 MG PO TABS: 2.0000 | ORAL_TABLET | ORAL | Status: DC | PRN

## 2014-02-26 MED ORDER — SODIUM CHLORIDE 0.9 % IV BOLUS (SEPSIS)
500.0000 mL | Freq: Once | INTRAVENOUS | Status: AC
  Administered 2014-02-26: 500 mL via INTRAVENOUS

## 2014-02-26 MED ORDER — DIAZEPAM 5 MG PO TABS: 5.0000 mg | ORAL_TABLET | Freq: Two times a day (BID) | ORAL | Status: DC

## 2014-02-26 NOTE — ED Notes (Signed)
Awake. Verbally responsive. A/O x4. Resp even and unlabored. No audible adventitious breath sounds noted. ABC's intact.  

## 2014-02-26 NOTE — ED Notes (Signed)
Pt from home c/o back pain. Pt sts that he injured his back approx 15 years ago. Pt reports that since yesterday, he has sudden onset severe back pain. Pt denies injury and does not know exacerbating factor. Pt is A&O and in NAD. Pt is not ambulatory today d/t severe pain. Pt received 250 mcg Fentanyl en route.

## 2014-02-26 NOTE — ED Notes (Signed)
HOB raised. Patient stated he was having back cramps and then rated pain 10/10

## 2014-02-26 NOTE — ED Notes (Signed)
Bed: MM40 Expected date: 02/26/14 Expected time: 11:05 AM Means of arrival: Ambulance Comments: Back pain

## 2014-02-26 NOTE — Discharge Instructions (Signed)
Back Pain, Adult Low back pain is very common. About 1 in 5 people have back pain.The cause of low back pain is rarely dangerous. The pain often gets better over time.About half of people with a sudden onset of back pain feel better in just 2 weeks. About 8 in 10 people feel better by 6 weeks.  CAUSES Some common causes of back pain include:  Strain of the muscles or ligaments supporting the spine.  Wear and tear (degeneration) of the spinal discs.  Arthritis.  Direct injury to the back. DIAGNOSIS Most of the time, the direct cause of low back pain is not known.However, back pain can be treated effectively even when the exact cause of the pain is unknown.Answering your caregiver's questions about your overall health and symptoms is one of the most accurate ways to make sure the cause of your pain is not dangerous. If your caregiver needs more information, he or she may order lab work or imaging tests (X-rays or MRIs).However, even if imaging tests show changes in your back, this usually does not require surgery. HOME CARE INSTRUCTIONS For many people, back pain returns.Since low back pain is rarely dangerous, it is often a condition that people can learn to manageon their own.   Remain active. It is stressful on the back to sit or stand in one place. Do not sit, drive, or stand in one place for more than 30 minutes at a time. Take short walks on level surfaces as soon as pain allows.Try to increase the length of time you walk each day.  Do not stay in bed.Resting more than 1 or 2 days can delay your recovery.  Do not avoid exercise or work.Your body is made to move.It is not dangerous to be active, even though your back may hurt.Your back will likely heal faster if you return to being active before your pain is gone.  Pay attention to your body when you bend and lift. Many people have less discomfortwhen lifting if they bend their knees, keep the load close to their bodies,and  avoid twisting. Often, the most comfortable positions are those that put less stress on your recovering back.  Find a comfortable position to sleep. Use a firm mattress and lie on your side with your knees slightly bent. If you lie on your back, put a pillow under your knees.  Only take over-the-counter or prescription medicines as directed by your caregiver. Over-the-counter medicines to reduce pain and inflammation are often the most helpful.Your caregiver may prescribe muscle relaxant drugs.These medicines help dull your pain so you can more quickly return to your normal activities and healthy exercise.  Put ice on the injured area.  Put ice in a plastic bag.  Place a towel between your skin and the bag.  Leave the ice on for 15-20 minutes, 03-04 times a day for the first 2 to 3 days. After that, ice and heat may be alternated to reduce pain and spasms.  Ask your caregiver about trying back exercises and gentle massage. This may be of some benefit.  Avoid feeling anxious or stressed.Stress increases muscle tension and can worsen back pain.It is important to recognize when you are anxious or stressed and learn ways to manage it.Exercise is a great option. SEEK MEDICAL CARE IF:  You have pain that is not relieved with rest or medicine.  You have pain that does not improve in 1 week.  You have new symptoms.  You are generally not feeling well. SEEK   IMMEDIATE MEDICAL CARE IF:   You have pain that radiates from your back into your legs.  You develop new bowel or bladder control problems.  You have unusual weakness or numbness in your arms or legs.  You develop nausea or vomiting.  You develop abdominal pain.  You feel faint. Document Released: 03/17/2005 Document Revised: 09/16/2011 Document Reviewed: 07/19/2013 Exit[employer] Patient Information 2015 [employer]. This information is not intended to replace advice given to you by your health care provider. Make sure you  discuss any questions you have with your health care provider.  

## 2014-02-26 NOTE — ED Notes (Signed)
Patient states that he is able to turn to the side with pain rating 5/10, but is afraid to sit. Patient states that if he can not sit then how is he going to ride in the car?

## 2014-02-26 NOTE — ED Notes (Signed)
Awake. Verbally responsive. A/O x4. Resp even and unlabored. No audible adventitious breath sounds noted. ABC's intact. Pt unable to sit or stand d/t pain in lower back.

## 2014-02-26 NOTE — ED Provider Notes (Signed)
CSN: [id number]     Arrival date & time 02/26/14  1103 History   First MD Initiated Contact with Patient 02/26/14 1228     Chief Complaint  Patient presents with  . Back Pain   HPI [name] is a 48 year old man with history of Wilms tumor s/p left nephrectomy, DM2, and HTN presenting with lower back pain. He reports having intermittent lower back pain for several years that he attributes to receiving radiation therapy for Wilms tumor when was a child, which has stunted muscle growth on his left side and led to scoliosis. He reports noticing back tightness two days ago and waking up yesterday morning with severe back pain that has immobilized him since that time.  He reports that the right side of his back feels tight, and he had been feeling midline lower back pressure that is occasionally sharp.  He reports being unable to stand or sit upright. He is usually able to control his back pain with Tylenol and rest, but it has not been responding.  He prefers to avoid NSAIDs because he only has one kidney.  He also noted losing 25 pounds over the last 6 weeks associated with a decreased appetite.  He has attributed this to poor control of his diabetes, and he has seen his PCP, but he has not had any blood work drawn.  He says that he has generally felt more fatigued than normal recently.  He denies fevers, chills, dysuria, urinary or fecal incontinence, or new numbness or weakness.   Past Medical History  Diagnosis Date  . Diabetes mellitus without complication     type 2  . Hyperlipidemia   . Allergic rhinitis   . lipoma   . H/O scoliosis   . Hypertension     Diagnostic exercise tolerance test assessment:04/30/2010 : comments normal -no evidence os ischemia by ST analysis   Past Surgical History  Procedure Laterality Date  . Lipoma excision    . Total nephrectomy Left   . Thyroid surgery    . Knee surgery     Family History  Problem Relation Age of Onset  . Heart disease Father     History  Substance Use Topics  . Smoking status: Never Smoker   . Smokeless tobacco: Never Used  . Alcohol Use: Yes     Comment: rare ,1-2 per week    Review of Systems  Constitutional: Positive for fatigue. Negative for fever, chills and diaphoresis.  HENT: Negative for congestion, rhinorrhea and sore throat.   Respiratory: Negative for cough, chest tightness and shortness of breath.   Cardiovascular: Negative for chest pain and leg swelling.  Gastrointestinal: Negative for vomiting, abdominal pain, diarrhea, constipation and abdominal distention.  Genitourinary: Negative for dysuria, hematuria and difficulty urinating.  Musculoskeletal: Negative for myalgias and arthralgias.  Skin: Negative for rash.  Neurological: Negative for dizziness, weakness, light-headedness, numbness and headaches.  Hematological: Negative for adenopathy.      Allergies  Review of patient's allergies indicates no known allergies.  Home Medications   Prior to Admission medications   Medication Sig Start Date End Date Taking? Authorizing Provider  acetaminophen (TYLENOL) 500 MG tablet Take 1,000 mg by mouth every 6 (six) hours as needed for moderate pain.   Yes Historical Provider, MD  albuterol (PROVENTIL HFA;VENTOLIN HFA) 108 (90 BASE) MCG/ACT inhaler Inhale 1 puff into the lungs every 6 (six) hours as needed for wheezing or shortness of breath.   Yes Historical Provider, MD  aspirin  EC 81 MG tablet Take 81 mg by mouth daily.   Yes Historical Provider, MD  atorvastatin (LIPITOR) 10 MG tablet Take 10 mg by mouth daily.   Yes Historical Provider, MD  diclofenac sodium (VOLTAREN) 1 % GEL Apply 2 g topically 4 (four) times daily.   Yes Historical Provider, MD  hydrochlorothiazide (HYDRODIURIL) 25 MG tablet Take 25 mg by mouth daily.   Yes Historical Provider, MD  lisinopril (PRINIVIL,ZESTRIL) 20 MG tablet Take 20 mg by mouth daily.   Yes Historical Provider, MD  Magnesium Salicylate Tetrahyd (DOANS  EXTRA STRENGTH) 580 MG TABS Take 2 tablets by mouth every 6 (six) hours as needed (pain).   Yes Historical Provider, MD  metFORMIN (GLUCOPHAGE) 500 MG tablet Take 500 mg by mouth 2 (two) times daily with a meal.   Yes Historical Provider, MD  mometasone (ASMANEX) 220 MCG/INH inhaler Inhale 1 puff into the lungs daily.   Yes Historical Provider, MD  repaglinide (PRANDIN) 0.5 MG tablet Take 0.5 mg by mouth 2 (two) times daily before a meal.   Yes Historical Provider, MD   BP 132/76 mmHg  Pulse 90  Temp(Src) 99.1 F (37.3 C) (Oral)  Resp 18  SpO2 99% Physical Exam  Constitutional: He is oriented to person, place, and time. He appears well-developed and well-nourished. No distress.  HENT:  Head: Normocephalic and atraumatic.  Mouth/Throat: No oropharyngeal exudate.  Eyes: Conjunctivae and EOM are normal. Pupils are equal, round, and reactive to light. No scleral icterus.  Neck: Normal range of motion. Neck supple.  Cardiovascular: Normal rate, regular rhythm and normal heart sounds.   Pulmonary/Chest: Effort normal and breath sounds normal. No respiratory distress.  Abdominal: Soft. Bowel sounds are normal. He exhibits no distension. There is no tenderness.  Scar on left lower abdomen from nephrectomy.  Musculoskeletal: Normal range of motion. He exhibits no edema.  Pain with flexion, extension, and rotation of back.  Straight leg raise negative.  Minimal pain with palpation of back. Tense right-sided paraspinal muscles.  Lymphadenopathy:    He has no cervical adenopathy.  Neurological: He is alert and oriented to person, place, and time. No cranial nerve deficit. He exhibits normal muscle tone.  Skin: Skin is warm and dry. No rash noted.  Multiple lipomas on back.    ED Course  Procedures (including critical care time) Labs Review Labs Reviewed - No data to display  Imaging Review No results found.   EKG Interpretation None      MDM   Final diagnoses:  None   Most likely  muscle spasm, but will check plain films with history of cancer. Also, will check CMP, CBC, and urinalysis with weight loss over the last few weeks.  Valium 5 mg IV for muscle spasm and treat pain with Dilaudid 1 mg, given desire to avoid NSAIDs with one kidney.  2:59 pm: X-ray with significant scoliosis likely the etiology of his musculoskeletal pain.  Glucose elevated consistent with patient's report and some ketones and protein in urine.  Pain relieved at rest, but aggravated with any movement.  Will give another dose of Dilaudid and try Toradol given continued pain and give some fluids to help with his hyperglycemia and ketonuria.  3:46 pm: Still awaiting administration of pain meds.  Will sign out care to Dr. Doy Mince at this time.  Arman Filter, MD 02/26/14 [place], MD 02/26/14 [phone]

## 2014-02-26 NOTE — ED Notes (Signed)
Patient transported to X-ray 

## 2014-02-26 NOTE — ED Notes (Signed)
In room to assist pt to sitting position and ambulate but pt was unable to sit up on side on bed. Pt reported that the pain was worse while attempting to sit on side of bed.

## 2014-02-27 ENCOUNTER — Observation Stay (HOSPITAL_COMMUNITY): Payer: BC Managed Care – PPO

## 2014-02-27 ENCOUNTER — Encounter (HOSPITAL_COMMUNITY): Payer: Self-pay | Admitting: *Deleted

## 2014-02-27 DIAGNOSIS — E1165 Type 2 diabetes mellitus with hyperglycemia: Secondary | ICD-10-CM

## 2014-02-27 DIAGNOSIS — M549 Dorsalgia, unspecified: Secondary | ICD-10-CM | POA: Insufficient documentation

## 2014-02-27 DIAGNOSIS — E785 Hyperlipidemia, unspecified: Secondary | ICD-10-CM

## 2014-02-27 DIAGNOSIS — I10 Essential (primary) hypertension: Secondary | ICD-10-CM

## 2014-02-27 DIAGNOSIS — IMO0002 Reserved for concepts with insufficient information to code with codable children: Secondary | ICD-10-CM | POA: Diagnosis present

## 2014-02-27 DIAGNOSIS — R739 Hyperglycemia, unspecified: Secondary | ICD-10-CM

## 2014-02-27 LAB — BASIC METABOLIC PANEL
ANION GAP: 15 (ref 5–15)
BUN: 16 mg/dL (ref 6–23)
CO2: 23 mEq/L (ref 19–32)
Calcium: 8.9 mg/dL (ref 8.4–10.5)
Chloride: 96 mEq/L (ref 96–112)
Creatinine, Ser: 0.84 mg/dL (ref 0.50–1.35)
GFR calc Af Amer: >90 mL/min (ref >90)
Glucose, Bld: 288 mg/dL — ABNORMAL HIGH (ref 70–99)
POTASSIUM: 4.1 meq/L (ref 3.7–5.3)
Sodium: 134 mEq/L — ABNORMAL LOW (ref 137–147)

## 2014-02-27 LAB — CBC
HCT: 37 % — ABNORMAL LOW (ref 39.0–52.0)
Hemoglobin: 13 g/dL (ref 13.0–17.0)
MCH: 32 pg (ref 26.0–34.0)
MCHC: 35.1 g/dL (ref 30.0–36.0)
MCV: 91.1 fL (ref 78.0–100.0)
PLATELETS: 170 10*3/uL (ref 150–400)
RBC: 4.06 MIL/uL — AB (ref 4.22–5.81)
RDW: 12.6 % (ref 11.5–15.5)
WBC: 9.8 10*3/uL (ref 4.0–10.5)

## 2014-02-27 LAB — HEMOGLOBIN A1C
Hgb A1c MFr Bld: 10.3 % — ABNORMAL HIGH (ref <5.7)
MEAN PLASMA GLUCOSE: 249 mg/dL — AB (ref <117)

## 2014-02-27 LAB — GLUCOSE, CAPILLARY
GLUCOSE-CAPILLARY: 253 mg/dL — AB (ref 70–99)
GLUCOSE-CAPILLARY: 303 mg/dL — AB (ref 70–99)
Glucose-Capillary: 266 mg/dL — ABNORMAL HIGH (ref 70–99)
Glucose-Capillary: 220 mg/dL — ABNORMAL HIGH (ref 70–99)
Glucose-Capillary: 252 mg/dL — ABNORMAL HIGH (ref 70–99)
Glucose-Capillary: 303 mg/dL — ABNORMAL HIGH (ref 70–99)

## 2014-02-27 MED ORDER — HYDROMORPHONE HCL 1 MG/ML IJ SOLN
1.0000 mg | Freq: Once | INTRAMUSCULAR | Status: AC
  Administered 2014-02-27: 1 mg via INTRAVENOUS
  Filled 2014-02-27: qty 1

## 2014-02-27 MED ORDER — INFLUENZA VAC SPLIT QUAD 0.5 ML IM SUSY
0.5000 mL | PREFILLED_SYRINGE | INTRAMUSCULAR | Status: DC
  Filled 2014-02-27 (×3): qty 0.5

## 2014-02-27 MED ORDER — INSULIN ASPART 100 UNIT/ML ~~LOC~~ SOLN
0.0000 [IU] | Freq: Every day | SUBCUTANEOUS | Status: DC
  Administered 2014-02-27: 4 [IU] via SUBCUTANEOUS

## 2014-02-27 MED ORDER — ALBUTEROL SULFATE (2.5 MG/3ML) 0.083% IN NEBU: 2.5000 mg | INHALATION_SOLUTION | Freq: Four times a day (QID) | RESPIRATORY_TRACT | Status: DC | PRN

## 2014-02-27 MED ORDER — METHOCARBAMOL 1000 MG/10ML IJ SOLN
500.0000 mg | Freq: Three times a day (TID) | INTRAVENOUS | Status: DC | PRN
  Administered 2014-02-27 (×3): 500 mg via INTRAVENOUS
  Filled 2014-02-27 (×5): qty 5

## 2014-02-27 MED ORDER — HYDROMORPHONE HCL 1 MG/ML IJ SOLN
0.5000 mg | INTRAMUSCULAR | Status: DC | PRN
  Administered 2014-02-27 (×2): 1 mg via INTRAVENOUS
  Filled 2014-02-27 (×2): qty 1

## 2014-02-27 MED ORDER — ASPIRIN EC 81 MG PO TBEC
81.0000 mg | DELAYED_RELEASE_TABLET | Freq: Every day | ORAL | Status: DC
  Administered 2014-02-27 – 2014-02-28 (×2): 81 mg via ORAL
  Filled 2014-02-27 (×3): qty 1

## 2014-02-27 MED ORDER — ACETAMINOPHEN 325 MG PO TABS
650.0000 mg | ORAL_TABLET | Freq: Four times a day (QID) | ORAL | Status: DC | PRN
  Administered 2014-02-28 (×2): 650 mg via ORAL
  Filled 2014-02-27 (×2): qty 2

## 2014-02-27 MED ORDER — ONDANSETRON HCL 4 MG/2ML IJ SOLN: 4.0000 mg | Freq: Four times a day (QID) | INTRAMUSCULAR | Status: DC | PRN

## 2014-02-27 MED ORDER — ONDANSETRON HCL 4 MG PO TABS: 4.0000 mg | ORAL_TABLET | Freq: Four times a day (QID) | ORAL | Status: DC | PRN

## 2014-02-27 MED ORDER — HYDROMORPHONE HCL 1 MG/ML IJ SOLN
1.0000 mg | INTRAMUSCULAR | Status: DC | PRN
  Administered 2014-02-27: 1 mg via INTRAVENOUS
  Administered 2014-02-28: 2 mg via INTRAVENOUS
  Administered 2014-02-28 (×2): 1 mg via INTRAVENOUS
  Administered 2014-02-28 – 2014-03-01 (×2): 2 mg via INTRAVENOUS
  Filled 2014-02-27 (×2): qty 2
  Filled 2014-02-27 (×2): qty 1
  Filled 2014-02-27: qty 2
  Filled 2014-02-27 (×2): qty 1

## 2014-02-27 MED ORDER — ENOXAPARIN SODIUM 40 MG/0.4ML ~~LOC~~ SOLN
40.0000 mg | SUBCUTANEOUS | Status: DC
  Administered 2014-02-27 – 2014-03-02 (×4): 40 mg via SUBCUTANEOUS
  Filled 2014-02-27 (×6): qty 0.4

## 2014-02-27 MED ORDER — PREDNISONE 20 MG PO TABS
60.0000 mg | ORAL_TABLET | Freq: Every day | ORAL | Status: DC
  Administered 2014-02-27 – 2014-02-28 (×2): 60 mg via ORAL
  Filled 2014-02-27 (×4): qty 1

## 2014-02-27 MED ORDER — ALUM & MAG HYDROXIDE-SIMETH 200-200-20 MG/5ML PO SUSP: 30.0000 mL | Freq: Four times a day (QID) | ORAL | Status: DC | PRN

## 2014-02-27 MED ORDER — FLUTICASONE PROPIONATE HFA 44 MCG/ACT IN AERO
1.0000 | INHALATION_SPRAY | Freq: Two times a day (BID) | RESPIRATORY_TRACT | Status: DC
Start: 2014-02-27 — End: 2014-03-02
  Administered 2014-02-27 – 2014-03-02 (×5): 1 via RESPIRATORY_TRACT
  Filled 2014-02-27 (×2): qty 10.6

## 2014-02-27 MED ORDER — ATORVASTATIN CALCIUM 10 MG PO TABS
10.0000 mg | ORAL_TABLET | Freq: Every day | ORAL | Status: DC
  Administered 2014-02-27 – 2014-02-28 (×2): 10 mg via ORAL
  Filled 2014-02-27 (×3): qty 1

## 2014-02-27 MED ORDER — OXYCODONE HCL 5 MG PO TABS
5.0000 mg | ORAL_TABLET | ORAL | Status: DC | PRN
  Filled 2014-02-27: qty 1

## 2014-02-27 MED ORDER — PANTOPRAZOLE SODIUM 40 MG PO TBEC
40.0000 mg | DELAYED_RELEASE_TABLET | Freq: Every day | ORAL | Status: DC
  Administered 2014-02-27 – 2014-02-28 (×2): 40 mg via ORAL
  Filled 2014-02-27 (×4): qty 1

## 2014-02-27 MED ORDER — KETOROLAC TROMETHAMINE 15 MG/ML IJ SOLN
15.0000 mg | Freq: Four times a day (QID) | INTRAMUSCULAR | Status: DC
  Administered 2014-02-27 – 2014-02-28 (×4): 15 mg via INTRAVENOUS
  Filled 2014-02-27 (×9): qty 1

## 2014-02-27 MED ORDER — INSULIN GLARGINE 100 UNIT/ML ~~LOC~~ SOLN
5.0000 [IU] | Freq: Every day | SUBCUTANEOUS | Status: DC
  Administered 2014-02-27: 5 [IU] via SUBCUTANEOUS
  Filled 2014-02-27: qty 0.05

## 2014-02-27 MED ORDER — INSULIN ASPART 100 UNIT/ML ~~LOC~~ SOLN
0.0000 [IU] | Freq: Three times a day (TID) | SUBCUTANEOUS | Status: DC
  Administered 2014-02-27: 8 [IU] via SUBCUTANEOUS
  Administered 2014-02-28 (×2): 5 [IU] via SUBCUTANEOUS

## 2014-02-27 MED ORDER — METHOCARBAMOL 1000 MG/10ML IJ SOLN
500.0000 mg | Freq: Four times a day (QID) | INTRAMUSCULAR | Status: DC
  Administered 2014-02-27 – 2014-03-01 (×7): 500 mg via INTRAVENOUS
  Filled 2014-02-27 (×11): qty 5

## 2014-02-27 MED ORDER — PNEUMOCOCCAL VAC POLYVALENT 25 MCG/0.5ML IJ INJ
0.5000 mL | INJECTION | INTRAMUSCULAR | Status: DC
  Filled 2014-02-27 (×3): qty 0.5

## 2014-02-27 MED ORDER — INSULIN ASPART 100 UNIT/ML ~~LOC~~ SOLN
0.0000 [IU] | Freq: Three times a day (TID) | SUBCUTANEOUS | Status: DC
  Administered 2014-02-27: 5 [IU] via SUBCUTANEOUS

## 2014-02-27 MED ORDER — SODIUM CHLORIDE 0.9 % IV SOLN
INTRAVENOUS | Status: DC
  Administered 2014-02-27 – 2014-03-01 (×4): via INTRAVENOUS
  Administered 2014-03-01: 125 mL/h via INTRAVENOUS
  Administered 2014-03-01: 03:00:00 via INTRAVENOUS

## 2014-02-27 MED ORDER — LISINOPRIL 10 MG PO TABS
20.0000 mg | ORAL_TABLET | Freq: Every day | ORAL | Status: DC
  Administered 2014-02-27 – 2014-02-28 (×2): 20 mg via ORAL
  Filled 2014-02-27 (×3): qty 1

## 2014-02-27 MED ORDER — ACETAMINOPHEN 650 MG RE SUPP
650.0000 mg | Freq: Four times a day (QID) | RECTAL | Status: DC | PRN
Start: 2014-02-27 — End: 2014-03-03
  Administered 2014-03-01: 650 mg via RECTAL
  Filled 2014-02-27: qty 1

## 2014-02-27 MED ORDER — HYDROCHLOROTHIAZIDE 25 MG PO TABS
25.0000 mg | ORAL_TABLET | Freq: Every day | ORAL | Status: DC
  Administered 2014-02-27 – 2014-02-28 (×2): 25 mg via ORAL
  Filled 2014-02-27 (×3): qty 1

## 2014-02-27 MED ORDER — GADOBENATE DIMEGLUMINE 529 MG/ML IV SOLN
18.0000 mL | Freq: Once | INTRAVENOUS | Status: AC | PRN
  Administered 2014-02-27: 18 mL via INTRAVENOUS

## 2014-02-27 MED ORDER — OXYCODONE HCL 5 MG PO TABS
5.0000 mg | ORAL_TABLET | ORAL | Status: DC | PRN
Start: 2014-02-27 — End: 2014-03-01
  Administered 2014-02-27 – 2014-02-28 (×4): 5 mg via ORAL
  Filled 2014-02-27 (×5): qty 1

## 2014-02-27 NOTE — H&P (Signed)
[facility] Admission History and Physical       [name] ZHG:[id number] DOB: [birth year] DOA: 02/26/2014  Referring physician: EDP PCP: Orpah Melter, MD  Specialists:   Chief Complaint: Back Pain  HPI: [name] is a 48 y.o. male with a history of DM2, HTN, and Hyperlipidemia and Scoliosiis who presents to the ED with complaints of severe 10/10 intractable low back pain x 4 days.  He denies any trauma or overexertion. He reports that he has flare Ups of this type of pain in his back but he reports that this is the worst he has ever had.   He denies andy dysuria or fever or chills.    In the ED, his pain decreased to a 7-8/10 with the pina medication and muscle relaxant rx that had been given he was referred for medical admission.      Review of Systems:  Constitutional: No Weight Loss, No Weight Gain, Night Sweats, Fevers, Chills, Dizziness, Fatigue, or Generalized Weakness HEENT: No Headaches, Difficulty Swallowing,Tooth/Dental Problems,Sore Throat,  No Sneezing, Rhinitis, Ear Ache, Nasal Congestion, or Post Nasal Drip,  Cardio-vascular:  No Chest pain, Orthopnea, PND, Edema in Lower Extremities, Anasarca, Dizziness, Palpitations  Resp: No Dyspnea, No DOE, No Productive Cough, No Non-Productive Cough, No Hemoptysis, No Wheezing.    GI: No Heartburn, Indigestion, Abdominal Pain, Nausea, Vomiting, Diarrhea, Hematemesis, Hematochezia, Melena, Change in Bowel Habits,  Loss of Appetite  GU: No Dysuria, Change in Color of Urine, No Urgency or Frequency, No Flank pain.  Musculoskeletal: No Joint Pain or Swelling, No Decreased Range of Motion, +Back Pain.  Neurologic: No Syncope, No Seizures, Muscle Weakness, Paresthesia, Vision Disturbance or Loss, No Diplopia, No Vertigo, No Difficulty Walking,  Skin: No Rash or Lesions. Psych: No Change in Mood or Affect, No Depression or Anxiety, No Memory loss, No Confusion, or Hallucinations   Past Medical History  Diagnosis Date   . Diabetes mellitus without complication     type 2  . Hyperlipidemia   . Allergic rhinitis   . lipoma   . H/O scoliosis   . Hypertension     Diagnostic exercise tolerance test assessment:04/30/2010 : comments normal -no evidence os ischemia by ST analysis      Past Surgical History  Procedure Laterality Date  . Lipoma excision    . Total nephrectomy Left   . Thyroid surgery    . Knee surgery         Prior to Admission medications   Medication Sig Start Date End Date Taking? Authorizing Provider  acetaminophen (TYLENOL) 500 MG tablet Take 1,000 mg by mouth every 6 (six) hours as needed for moderate pain.   Yes Historical Provider, MD  albuterol (PROVENTIL HFA;VENTOLIN HFA) 108 (90 BASE) MCG/ACT inhaler Inhale 1 puff into the lungs every 6 (six) hours as needed for wheezing or shortness of breath.   Yes Historical Provider, MD  aspirin EC 81 MG tablet Take 81 mg by mouth daily.   Yes Historical Provider, MD  atorvastatin (LIPITOR) 10 MG tablet Take 10 mg by mouth daily.   Yes Historical Provider, MD  diclofenac sodium (VOLTAREN) 1 % GEL Apply 2 g topically 4 (four) times daily.   Yes Historical Provider, MD  hydrochlorothiazide (HYDRODIURIL) 25 MG tablet Take 25 mg by mouth daily.   Yes Historical Provider, MD  lisinopril (PRINIVIL,ZESTRIL) 20 MG tablet Take 20 mg by mouth daily.   Yes Historical Provider, MD  Magnesium Salicylate Tetrahyd (DOANS EXTRA STRENGTH) 580 MG TABS Take 2  tablets by mouth every 6 (six) hours as needed (pain).   Yes Historical Provider, MD  metFORMIN (GLUCOPHAGE) 500 MG tablet Take 500 mg by mouth 2 (two) times daily with a meal.   Yes Historical Provider, MD  mometasone (ASMANEX) 220 MCG/INH inhaler Inhale 1 puff into the lungs daily.   Yes Historical Provider, MD  repaglinide (PRANDIN) 0.5 MG tablet Take 0.5 mg by mouth 2 (two) times daily before a meal.   Yes Historical Provider, MD  diazepam (VALIUM) 5 MG tablet Take 1 tablet (5 mg total) by mouth 2  (two) times daily. As needed for muscle spasms 02/26/14   Malvin Johns, MD  oxyCODONE-acetaminophen (PERCOCET) 5-325 MG per tablet Take 2 tablets by mouth every 4 (four) hours as needed. 02/26/14   Malvin Johns, MD      No Known Allergies   Social History:  reports that he has never smoked. He has never used smokeless tobacco. He reports that he drinks alcohol. He reports that he does not use illicit drugs.     Family History  Problem Relation Age of Onset  . Heart disease Father        Physical Exam:  GEN:  Pleasant Well Developed  48 y.o. Caucasian male examined and in no acute distress; cooperative with exam Filed Vitals:   02/26/14 2203 02/27/14 0033 02/27/14 0135 02/27/14 0530  BP: 132/77 152/84 144/82 131/81  Pulse: 82 87 91 98  Temp:  98.6 F (37 C) 98.4 F (36.9 C) 98.9 F (37.2 C)  TempSrc:  Oral Oral Oral  Resp: 18 20 20 16   SpO2: 96% 98% 97% 98%   Blood pressure 131/81, pulse 98, temperature 98.9 F (37.2 C), temperature source Oral, resp. rate 16, SpO2 98 %. PSYCH: He is alert and oriented x4; does not appear anxious does not appear depressed; affect is normal HEENT: Normocephalic and Atraumatic, Mucous membranes pink; PERRLA; EOM intact; Fundi:  Benign;  No scleral icterus, Nares: Patent, Oropharynx: Clear, Fair Dentition,    Neck:  FROM, No Cervical Lymphadenopathy nor Thyromegaly or Carotid Bruit; No JVD; Breasts:: Not examined CHEST WALL: No tenderness CHEST: Normal respiration, clear to auscultation bilaterally HEART: Regular rate and rhythm; no murmurs rubs or gallops BACK: No kyphosis , +Scoliosis; No CVA tenderness ABDOMEN: Positive Bowel Sounds, Obese, Soft Non-Tender; No Masses, No Organomegaly. Rectal Exam: Not done EXTREMITIES: No Cyanosis, Clubbing, or Edema; No Ulcerations. Genitalia: not examined PULSES: 2+ and symmetric SKIN: Normal hydration no rash or ulceration CNS:  Alert and Oriented x 4, No Focal Deficits Vascular: pulses palpable  throughout    Labs on Admission:  Basic Metabolic Panel:  Recent Labs Lab 02/26/14 1329 02/27/14 0437  NA 134* 134*  K 3.7 4.1  CL 96 96  CO2 22 23  GLUCOSE 268* 288*  BUN 16 16  CREATININE 0.83 0.84  CALCIUM 9.0 8.9   Liver Function Tests:  Recent Labs Lab 02/26/14 1329  AST 17  ALT 21  ALKPHOS 66  BILITOT 0.6  PROT 7.5  ALBUMIN 3.0*   No results for input(s): LIPASE, AMYLASE in the last 168 hours. No results for input(s): AMMONIA in the last 168 hours. CBC:  Recent Labs Lab 02/26/14 1329 02/27/14 0437  WBC 10.9* 9.8  NEUTROABS 9.3*  --   HGB 13.6 13.0  HCT 39.0 37.0*  MCV 89.2 91.1  PLT 183 170   Cardiac Enzymes: No results for input(s): CKTOTAL, CKMB, CKMBINDEX, TROPONINI in the last 168 hours.  BNP (last 3  results) No results for input(s): PROBNP in the last 8760 hours. CBG: No results for input(s): GLUCAP in the last 168 hours.  Radiological Exams on Admission: Dg Lumbar Spine Complete  02/26/2014   CLINICAL DATA:  low back pain with limited movement yesterday morning after trying to get out of bedPt had no traumaPt also states he has a hx of scoliosis  EXAM: LUMBAR SPINE - COMPLETE 4+ VIEW  COMPARISON:  None.  FINDINGS: There is a lumbar dextroscoliosis apex L2. Mild narrowing of the L3-4 interspace. Anterior endplate spurring A76-O1. Negative for fracture or dislocation. Normal mineralization.  IMPRESSION: 1. Negative for fracture or other acute bone abnormality. 2. Lumbar dextroscoliosis with associated degenerative changes as above.   Electronically Signed   By: Arne Cleveland M.D.   On: 02/26/2014 13:55     Assessment/Plan:   48 y.o. male with  Principal Problem:   1.   Intractable back pain   Pain control with IV Dilaudid PRN   MRI of Lumbar spine in AM  Active Problems:   2.   HYPERTENSION, BENIGN   Continue Prinivil and HCTZ Rx   Monitor BPs    3.   Hyperglycemia/ DM (diabetes mellitus), type 2, uncontrolled   Hold oral  Hypoglcemics   SSI coverage PRN   Check HbA1C in AM    4.   Hyperlipidemia   Continue Atorvastatin Rx      5.   DVT Prophylaxis   Lovenox    Code Status:    FULL CODE Family Communication:   No Family Present  Disposition Plan:     Inpatient    Time spent:  [place] [facility] Pager [phone]   If [place] Please Contact the Day Rounding Team MD for [facility]  If 7PM-7AM, Please Contact Night-Floor Coverage  [email or web address] Password [email or web address] 02/27/2014, 5:55 AM

## 2014-02-27 NOTE — ED Notes (Signed)
Resting quietly with eye closed. Easily arousable. Verbally responsive. Resp even and unlabored. ABC's intact. NAD noted.  

## 2014-02-27 NOTE — Progress Notes (Signed)
UR completed 

## 2014-02-27 NOTE — ED Notes (Signed)
Awake. Verbally responsive. A/O x4. Resp even and unlabored. No audible adventitious breath sounds noted. ABC's intact.  

## 2014-02-27 NOTE — Progress Notes (Signed)
Inpatient Diabetes Program Recommendations  AACE/ADA: New Consensus Statement on Inpatient Glycemic Control (2013)  Target Ranges:  Prepandial:   less than 140 mg/dL      Peak postprandial:   less than 180 mg/dL (1-2 hours)      Critically ill patients:  140 - 180 mg/dL   Reason for Visit: Hyperglycemia  Diabetes history: DM2 Outpatient Diabetes medications: Prandin .5 mg bid and metformin 500 mg bid Current orders for Inpatient glycemic control: Lantus 10 units QHS, Novolog resistant tidwc and hs  Inpatient Diabetes Program Recommendations Insulin - Basal: Lantus 12 units QHS HgbA1C: 10.3% - uncontrolled Outpatient Referral: Will order OP Diabetes Education consult for uncontrolled DM  Add Novolog 4 units tidwc for meal coverage insulin  Note: Spoke with pt and wife at length regarding importance of glucose control. Pt willing to go on multiple daily insulin injections. States he prefers a pen. Will teach insulin pen administration tomorrow am. Ordered Living Well With Diabetes book and OP Diabetes Education. Pt very motivated to control blood sugars after discharge. Will f/u with PCP.  Continue to monitor. Thank you. Lorenda Peck, RD, LDN, CDE Inpatient Diabetes Coordinator [phone]

## 2014-02-27 NOTE — ED Provider Notes (Signed)
Care assumed from Dr. Tamera Punt.  Pt describes a flare up of back pain similar to prior flare up except worse.  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  By history and exam, his pain appears MSK in nature.  He denies weakness, numbness, tingling, saddle anesthesia, bowel or bladder incontinence, IV drug use, or fevers.  After multiple rounds of pain medications, pt is still unable to sit up or ambulate secondary to pain.  Have discussed with Dr. Arnoldo Morale for admission.    Clinical Impression: 1. Back pain        Artis Delay, MD 02/27/14 [phone]

## 2014-02-27 NOTE — Plan of Care (Signed)
Problem: Phase I Progression Outcomes Goal: Pain controlled with appropriate interventions Outcome: Completed/Met Date Met:  02/27/14 Goal: Voiding-avoid urinary catheter unless indicated Outcome: Completed/Met Date Met:  02/27/14 Goal: Hemodynamically stable Outcome: Completed/Met Date Met:  02/27/14     

## 2014-02-27 NOTE — Progress Notes (Signed)
Patient seen and examined, admitted this morning with acute back pain. Briefly 48 year old male with diabetes type 2, hypertension, hyperlipidemia, scoliosis presented with severe 10/10 intractable low back pain for last 4 days. He has off-and-on chronic back pain.  BP 138/78 mmHg  Pulse 86  Temp(Src) 98.7 F (37.1 C) (Oral)  Resp 20  Ht 6' 0.5" (1.842 m)  Wt 86.183 kg (190 lb)  BMI 25.40 kg/m2  SpO2 99%  Patient seen and examined Agree with assessment and plan  Acute back pain X-ray of the lumbar spine negative for fracture or other acute bony abnormality, lumbar dextroscoliosis with associated degenerative changes MRI of the lumbar spine pending Placed on IV and oral pain medications, scheduled Toradol for 24 hours, IV Robaxin,prednisone Will discuss with neurosurgery pending MRI results   Daeveon Zweber M.D. [facility] 02/27/2014, 11:17 AM  Pager: [phone]

## 2014-02-28 ENCOUNTER — Observation Stay (HOSPITAL_COMMUNITY): Payer: BC Managed Care – PPO

## 2014-02-28 DIAGNOSIS — R509 Fever, unspecified: Secondary | ICD-10-CM | POA: Diagnosis not present

## 2014-02-28 DIAGNOSIS — R5383 Other fatigue: Secondary | ICD-10-CM | POA: Diagnosis not present

## 2014-02-28 DIAGNOSIS — A419 Sepsis, unspecified organism: Secondary | ICD-10-CM | POA: Diagnosis not present

## 2014-02-28 DIAGNOSIS — I6340 Cerebral infarction due to embolism of unspecified cerebral artery: Secondary | ICD-10-CM | POA: Diagnosis present

## 2014-02-28 DIAGNOSIS — I330 Acute and subacute infective endocarditis: Secondary | ICD-10-CM | POA: Diagnosis present

## 2014-02-28 DIAGNOSIS — R4182 Altered mental status, unspecified: Secondary | ICD-10-CM | POA: Diagnosis not present

## 2014-02-28 DIAGNOSIS — M4626 Osteomyelitis of vertebra, lumbar region: Secondary | ICD-10-CM | POA: Diagnosis present

## 2014-02-28 DIAGNOSIS — M549 Dorsalgia, unspecified: Secondary | ICD-10-CM | POA: Diagnosis present

## 2014-02-28 DIAGNOSIS — B952 Enterococcus as the cause of diseases classified elsewhere: Secondary | ICD-10-CM | POA: Diagnosis not present

## 2014-02-28 DIAGNOSIS — R61 Generalized hyperhidrosis: Secondary | ICD-10-CM | POA: Diagnosis present

## 2014-02-28 DIAGNOSIS — I669 Occlusion and stenosis of unspecified cerebral artery: Secondary | ICD-10-CM | POA: Diagnosis not present

## 2014-02-28 DIAGNOSIS — R5081 Fever presenting with conditions classified elsewhere: Secondary | ICD-10-CM | POA: Diagnosis not present

## 2014-02-28 DIAGNOSIS — E872 Acidosis: Secondary | ICD-10-CM | POA: Diagnosis present

## 2014-02-28 DIAGNOSIS — R7881 Bacteremia: Secondary | ICD-10-CM | POA: Diagnosis not present

## 2014-02-28 DIAGNOSIS — Z79899 Other long term (current) drug therapy: Secondary | ICD-10-CM | POA: Diagnosis not present

## 2014-02-28 DIAGNOSIS — R6520 Severe sepsis without septic shock: Secondary | ICD-10-CM | POA: Diagnosis present

## 2014-02-28 DIAGNOSIS — Q600 Renal agenesis, unilateral: Secondary | ICD-10-CM | POA: Diagnosis not present

## 2014-02-28 DIAGNOSIS — E119 Type 2 diabetes mellitus without complications: Secondary | ICD-10-CM

## 2014-02-28 DIAGNOSIS — M4646 Discitis, unspecified, lumbar region: Secondary | ICD-10-CM | POA: Diagnosis not present

## 2014-02-28 DIAGNOSIS — R634 Abnormal weight loss: Secondary | ICD-10-CM

## 2014-02-28 DIAGNOSIS — R64 Cachexia: Secondary | ICD-10-CM | POA: Diagnosis not present

## 2014-02-28 DIAGNOSIS — E785 Hyperlipidemia, unspecified: Secondary | ICD-10-CM | POA: Diagnosis present

## 2014-02-28 DIAGNOSIS — I10 Essential (primary) hypertension: Secondary | ICD-10-CM | POA: Diagnosis present

## 2014-02-28 DIAGNOSIS — I76 Septic arterial embolism: Secondary | ICD-10-CM | POA: Diagnosis present

## 2014-02-28 DIAGNOSIS — I429 Cardiomyopathy, unspecified: Secondary | ICD-10-CM | POA: Diagnosis present

## 2014-02-28 DIAGNOSIS — D649 Anemia, unspecified: Secondary | ICD-10-CM | POA: Diagnosis present

## 2014-02-28 DIAGNOSIS — J9601 Acute respiratory failure with hypoxia: Secondary | ICD-10-CM | POA: Diagnosis present

## 2014-02-28 DIAGNOSIS — A4181 Sepsis due to Enterococcus: Secondary | ICD-10-CM | POA: Diagnosis present

## 2014-02-28 DIAGNOSIS — Z7951 Long term (current) use of inhaled steroids: Secondary | ICD-10-CM | POA: Diagnosis not present

## 2014-02-28 DIAGNOSIS — B9689 Other specified bacterial agents as the cause of diseases classified elsewhere: Secondary | ICD-10-CM | POA: Diagnosis present

## 2014-02-28 DIAGNOSIS — M419 Scoliosis, unspecified: Secondary | ICD-10-CM | POA: Diagnosis present

## 2014-02-28 DIAGNOSIS — T814XXA Infection following a procedure, initial encounter: Secondary | ICD-10-CM | POA: Diagnosis present

## 2014-02-28 DIAGNOSIS — Z79891 Long term (current) use of opiate analgesic: Secondary | ICD-10-CM | POA: Diagnosis not present

## 2014-02-28 DIAGNOSIS — E1165 Type 2 diabetes mellitus with hyperglycemia: Secondary | ICD-10-CM | POA: Diagnosis not present

## 2014-02-28 DIAGNOSIS — Z7982 Long term (current) use of aspirin: Secondary | ICD-10-CM | POA: Diagnosis not present

## 2014-02-28 DIAGNOSIS — R569 Unspecified convulsions: Secondary | ICD-10-CM | POA: Diagnosis present

## 2014-02-28 LAB — BASIC METABOLIC PANEL
ANION GAP: 14 (ref 5–15)
BUN: 16 mg/dL (ref 6–23)
CALCIUM: 9.1 mg/dL (ref 8.4–10.5)
CO2: 24 mEq/L (ref 19–32)
Chloride: 96 mEq/L (ref 96–112)
Creatinine, Ser: 0.91 mg/dL (ref 0.50–1.35)
GFR calc Af Amer: >90 mL/min (ref >90)
GFR calc non Af Amer: >90 mL/min (ref >90)
Glucose, Bld: 273 mg/dL — ABNORMAL HIGH (ref 70–99)
Potassium: 4.4 mEq/L (ref 3.7–5.3)
SODIUM: 134 meq/L — AB (ref 137–147)

## 2014-02-28 LAB — CBC
HCT: 36.9 % — ABNORMAL LOW (ref 39.0–52.0)
Hemoglobin: 13 g/dL (ref 13.0–17.0)
MCH: 31.2 pg (ref 26.0–34.0)
MCHC: 35.2 g/dL (ref 30.0–36.0)
MCV: 88.5 fL (ref 78.0–100.0)
Platelets: 185 10*3/uL (ref 150–400)
RBC: 4.17 MIL/uL — ABNORMAL LOW (ref 4.22–5.81)
RDW: 12.4 % (ref 11.5–15.5)
WBC: 12.6 10*3/uL — ABNORMAL HIGH (ref 4.0–10.5)

## 2014-02-28 LAB — GLUCOSE, CAPILLARY
GLUCOSE-CAPILLARY: 216 mg/dL — AB (ref 70–99)
GLUCOSE-CAPILLARY: 236 mg/dL — AB (ref 70–99)
Glucose-Capillary: 222 mg/dL — ABNORMAL HIGH (ref 70–99)
Glucose-Capillary: 241 mg/dL — ABNORMAL HIGH (ref 70–99)

## 2014-02-28 LAB — SEDIMENTATION RATE: SED RATE: 56 mm/h — AB (ref 0–16)

## 2014-02-28 LAB — C-REACTIVE PROTEIN: CRP: 20.2 mg/dL — ABNORMAL HIGH (ref <0.60)

## 2014-02-28 LAB — HIV ANTIBODY (ROUTINE TESTING W REFLEX): HIV: NONREACTIVE

## 2014-02-28 LAB — RHEUMATOID FACTOR: Rhuematoid fact SerPl-aCnc: <10 IU/mL (ref <=14)

## 2014-02-28 MED ORDER — INSULIN ASPART 100 UNIT/ML ~~LOC~~ SOLN
0.0000 [IU] | Freq: Three times a day (TID) | SUBCUTANEOUS | Status: DC
  Administered 2014-02-28: 3 [IU] via SUBCUTANEOUS

## 2014-02-28 MED ORDER — LIVING WELL WITH DIABETES BOOK
Freq: Once | Status: AC
  Administered 2014-02-28: 16:00:00
  Filled 2014-02-28: qty 1

## 2014-02-28 MED ORDER — INSULIN ASPART 100 UNIT/ML ~~LOC~~ SOLN
0.0000 [IU] | Freq: Every day | SUBCUTANEOUS | Status: DC
  Administered 2014-02-28: 2 [IU] via SUBCUTANEOUS

## 2014-02-28 MED ORDER — INSULIN GLARGINE 100 UNIT/ML ~~LOC~~ SOLN
10.0000 [IU] | Freq: Every day | SUBCUTANEOUS | Status: DC
  Administered 2014-02-28: 10 [IU] via SUBCUTANEOUS
  Filled 2014-02-28: qty 0.1

## 2014-02-28 MED ORDER — INSULIN STARTER KIT- PEN NEEDLES (ENGLISH)
1.0000 | Freq: Once | Status: DC
  Filled 2014-02-28: qty 1

## 2014-02-28 NOTE — Progress Notes (Signed)
UR completed 

## 2014-02-28 NOTE — Plan of Care (Signed)
Problem: Phase I Progression Outcomes Goal: Pain controlled with appropriate interventions Outcome: Completed/Met Date Met:  02/28/14     

## 2014-02-28 NOTE — Progress Notes (Addendum)
PATIENT DETAILS Name: [name] Age: 48 y.o. Sex: male Date of Birth: [birth year] Admit Date: 02/26/2014 Admitting Physician Theressa Millard, MD [id number], Annie Main, MD  Subjective: "Flushed, Hot" when I walked in . Claimed he was cold an hour ago! Claims he has been having hot/chills/sweats for 5-6 weeks.  Back pain essentially unchanged, claims he cannot walk because of pain.  Assessment/Plan: Principal Problem:   Intractable back pain:Continues to have significant back pain, unable to ambulate. MRI LS Spine shows possible Right L3 root compression from prolapased disc. Will discuss case with Neurosurgery, as pain continues to be a significant issue. However given fever-will need make sure no diskitis-although none reported in the report. Continue Prednisone,Robaxin, Narcotics and other supportive measures.   Addendum:spoke with Dr Delila Spence reviewed MR images-he thinks that patient does not have major findings to suggest diskitis, but he will review MR images with Radiology to make sure. However, given fever-not a candidate for surgical correction of prolapsed disc-advises that we provide supportive care. Since prolapsed disc with possible nerve root compression-not a emergent issue, patient can follow with him post discharge.  Active Problems:   SIR's/Fever: per patient he has been having fever for 5-6 weeks. He did recently on Oct 30th travel to [place] ([place] and [place] and returned on Nov 13th-but claims that his fever preceded this travel.Apparently recently had numerous Lipomas excised in [facility] in mid September, following which he had "infection" and was briefly on Levaquin. For now, will obtain blood cultures, CXR,malaria smear (although doubt since these symptoms preceeded  foreign travel. Have consulted ID, if infection ruled out, then will check CT Chest/Abdomen to see if any lymphadenopathy. Will hold of starting any Abx-as no foci of  infection apparent.    HYPERTENSION, BENIGN:controlled-continue HCTZ and Lisinopril.    DM (diabetes mellitus), type 2, uncontrolled:A1C 10.3. CBG's uncontrolled-but on steroids. Increase Lantus to 10 units, change SSI to resistant.    Hyperlipidemia:continue Statins  Disposition: Remain inpatient  Antibiotics: None  Anti-infectives    None      DVT Prophylaxis: Prophylactic Lovenox   Code Status: Full code   Family Communication None  Procedures:  None  CONSULTS:  ID and Neurosurgery  Time spent 40 minutes-which includes 50% of the time with face-to-face with patient/ family and coordinating care related to the above assessment and plan.  MEDICATIONS: Scheduled Meds: . aspirin EC  81 mg Oral Daily  . atorvastatin  10 mg Oral Daily  . enoxaparin (LOVENOX) injection  40 mg Subcutaneous Q24H  . fluticasone  1 puff Inhalation BID  . hydrochlorothiazide  25 mg Oral Daily  . Influenza vac split quadrivalent PF  0.5 mL Intramuscular Tomorrow-1000  . insulin aspart  0-15 Units Subcutaneous TID WC  . insulin aspart  0-5 Units Subcutaneous QHS  . insulin glargine  5 Units Subcutaneous QHS  . ketorolac  15 mg Intravenous 4 times per day  . lisinopril  20 mg Oral Daily  . methocarbamol (ROBAXIN)  IV  500 mg Intravenous 4 times per day  . pantoprazole  40 mg Oral Q0600  . pneumococcal 23 valent vaccine  0.5 mL Intramuscular Tomorrow-1000  . predniSONE  60 mg Oral Q breakfast   Continuous Infusions: . sodium chloride 125 mL/hr at 02/28/14 0903   PRN Meds:.acetaminophen **OR** acetaminophen, albuterol, alum & mag hydroxide-simeth, HYDROmorphone (DILAUDID) injection, methocarbamol (ROBAXIN)  IV, ondansetron **OR** ondansetron (ZOFRAN) IV, oxyCODONE    PHYSICAL EXAM: Vital signs in  last 24 hours: Filed Vitals:   02/28/14 0140 02/28/14 0451 02/28/14 0834 02/28/14 0845  BP: 133/66 139/74  110/61  Pulse: 73 91  135  Temp: 97.9 F (36.6 C) 98.1 F (36.7 C) 105.8 F  (41 C)   TempSrc: Oral Oral Rectal   Resp: 16 16  20   Height:      Weight:      SpO2: 100% 99%  96%    Weight change:  Filed Weights   02/27/14 0957  Weight: 86.183 kg (190 lb)   Body mass index is 25.4 kg/(m^2).   Gen Exam: Awake and alert with clear speech.  Neck: Supple, No JVD.   Chest: B/L Clear.   CVS: S1 S2 Regular, no murmurs.  Abdomen: soft, BS +, non tender, non distended.  Extremities: no edema, lower extremities warm to touch. Neurologic: Non Focal.   Skin: No Rash.   Wounds: N/A.   Intake/Output from previous day:  Intake/Output Summary (Last 24 hours) at 02/28/14 0910 Last data filed at 02/28/14 0846  Gross per 24 hour  Intake    790 ml  Output   1150 ml  Net   -360 ml     LAB RESULTS: CBC  Recent Labs Lab 02/26/14 1329 02/27/14 0437 02/28/14 0355  WBC 10.9* 9.8 12.6*  HGB 13.6 13.0 13.0  HCT 39.0 37.0* 36.9*  PLT 183 170 185  MCV 89.2 91.1 88.5  MCH 31.1 32.0 31.2  MCHC 34.9 35.1 35.2  RDW 12.5 12.6 12.4  LYMPHSABS 0.7  --   --   MONOABS 0.9  --   --   EOSABS 0.0  --   --   BASOSABS 0.0  --   --     Chemistries   Recent Labs Lab 02/26/14 1329 02/27/14 0437 02/28/14 0355  NA 134* 134* 134*  K 3.7 4.1 4.4  CL 96 96 96  CO2 22 23 24   GLUCOSE 268* 288* 273*  BUN 16 16 16   CREATININE 0.83 0.84 0.91  CALCIUM 9.0 8.9 9.1    CBG:  Recent Labs Lab 02/27/14 1410 02/27/14 1547 02/27/14 1824 02/27/14 2123 02/28/14 0758  GLUCAP 220* 252* 303* 303* 222*    GFR Estimated Creatinine Clearance: 113.1 mL/min (by C-G formula based on Cr of 0.91).  Coagulation profile No results for input(s): INR, PROTIME in the last 168 hours.  Cardiac Enzymes No results for input(s): CKMB, TROPONINI, MYOGLOBIN in the last 168 hours.  Invalid input(s): CK  Invalid input(s): POCBNP No results for input(s): DDIMER in the last 72 hours.  Recent Labs  02/27/14 0437  HGBA1C 10.3*   No results for input(s): CHOL, HDL, LDLCALC, TRIG,  CHOLHDL, LDLDIRECT in the last 72 hours. No results for input(s): TSH, T4TOTAL, T3FREE, THYROIDAB in the last 72 hours.  Invalid input(s): FREET3 No results for input(s): VITAMINB12, FOLATE, FERRITIN, TIBC, IRON, RETICCTPCT in the last 72 hours. No results for input(s): LIPASE, AMYLASE in the last 72 hours.  Urine Studies No results for input(s): UHGB, CRYS in the last 72 hours.  Invalid input(s): UACOL, UAPR, USPG, UPH, UTP, UGL, UKET, UBIL, UNIT, UROB, ULEU, UEPI, UWBC, URBC, UBAC, CAST, UCOM, BILUA  MICROBIOLOGY: No results found for this or any previous visit (from the past 240 hour(s)).  RADIOLOGY STUDIES/RESULTS: Dg Lumbar Spine Complete  02/26/2014   CLINICAL DATA:  low back pain with limited movement yesterday morning after trying to get out of bedPt had no traumaPt also states he has a hx of scoliosis  EXAM: LUMBAR SPINE - COMPLETE 4+ VIEW  COMPARISON:  None.  FINDINGS: There is a lumbar dextroscoliosis apex L2. Mild narrowing of the L3-4 interspace. Anterior endplate spurring M01-U2. Negative for fracture or dislocation. Normal mineralization.  IMPRESSION: 1. Negative for fracture or other acute bone abnormality. 2. Lumbar dextroscoliosis with associated degenerative changes as above.   Electronically Signed   By: Arne Cleveland M.D.   On: 02/26/2014 13:55   Mr Lumbar Spine W Wo Contrast  02/27/2014   CLINICAL DATA:  Intractable back pain.  History of scoliosis.  EXAM: MRI LUMBAR SPINE WITHOUT AND WITH CONTRAST  TECHNIQUE: Multiplanar and multiecho pulse sequences of the lumbar spine were obtained without and with intravenous contrast.  CONTRAST:  28mL MULTIHANCE GADOBENATE DIMEGLUMINE 529 MG/ML IV SOLN  COMPARISON:  Prior radiograph from 02/26/2014.  FINDINGS: For the purposes of this dictation, the lowest well-formed intervertebral disc spaces presumed to be the L5-S1 level, and there presumed to be 5 lumbar type vertebral bodies.  Dextroscoliosis of the lumbar spine with apex at  L2 is present. Vertebral bodies are otherwise normally aligned with preservation of the normal lumbar lordosis. Vertebral body heights are maintained. No acute fracture.  Lobular 2.1 cm T1 hypointense, T2/STIR hyperintense nonenhancing lesion within the central aspect of the sacrum is noted, likely a benign hemangioma. No other focal osseous lesion.  The conus medullaris terminates normally at the L1 level. Signal intensity within the visualized cord is within normal limits. Nerve roots of the cauda equina are unremarkable.  Paraspinous soft tissues are within normal limits.  No abnormal enhancement identified within the lumbar spine.  T10-11: Seen only on sagittal projection. No disc bulge or disc protrusion. No canal or foraminal stenosis.  T11-12: Seen only on sagittal projection. No disc bulge or disc protrusion. No canal or foraminal stenosis.  T12-L1: No disc bulge or disc protrusion. No significant facet arthropathy. No canal or foraminal stenosis.  L1-2: Mild disc desiccation without significant disc bulge or disc protrusion. No significant canal or foraminal stenosis. No significant facet arthrosis.  L2-3: Mild diffuse disc bulge with disc desiccation. No focal disc herniation. Thecal sac remains widely patent without significant canal stenosis. No significant foraminal narrowing.  L3-4: Degenerative intervertebral disc space narrowing with disc bulge and disc desiccation. There are associated degenerative endplate changes with chronic Schmorl's node present at the anterior aspect of the inferior endplate of L3. Degenerative endplate spurring present anteriorly. There is a superimposed right foraminal shallow disc protrusion, closely approximating/abutting the exiting right L3 nerve root as it courses out of the right neural foramen (series 7, image 22). There is an associated annular fissure. Mild bilateral facet arthrosis present with asymmetric fluid density present within the left L3-4 facet. Findings  are likely related too scoliotic curvature of the lumbar spine, most prevalent at this level. The thecal sac remains widely patent without significant stenosis. There is moderate to severe right with mild left foraminal stenosis.  L4-5: Mild diffuse disc bulge with disc desiccation. No focal disc herniation. Moderate right facet arthrosis. No significant canal narrowing. There is mild left with moderate right foraminal stenosis.  L5-S1: Mild disc desiccation without disc bulge or disc protrusion. No significant facet arthrosis. No canal or foraminal stenosis.  IMPRESSION: 1. No acute abnormality or abnormal enhancement within the lumbar spine. 2. Dextroscoliosis of the lumbar spine with apex at L2. 3. Degenerative disc disease with superimposed right foraminal disc protrusion at L3-4, abutting the exiting right L3 nerve root as it courses  out of the right neural foramen. This could potentially result in nerve root irritation. There is moderate to severe right with mild left foraminal narrowing at this level. 4. Mild degenerative disc disease and facet arthrosis at L4-5 with resultant moderate right and mild left foraminal narrowing.   Electronically Signed   By: Jeannine Boga M.D.   On: 02/27/2014 22:56    Oren Binet, MD  [facility] Pager:[phone]  If 7PM-7AM, please contact night-coverage [email or web address] Password [email or web address] 02/28/2014, 9:10 AM   LOS: 2 days

## 2014-02-28 NOTE — Consult Note (Signed)
[facility]  Total days of antibiotics 0               Reason for Consult: fever of unknown origin   Referring Physician: ghimire  Principal Problem:   Intractable back pain Active Problems:   HYPERTENSION, BENIGN   Hyperglycemia   DM (diabetes mellitus), type 2, uncontrolled   Hyperlipidemia    HPI: [name] is a 48 y.o. male with poorly controlled DM, HLD, hx of thyroid nodules, lipomatosis, admitted on 11/30 for severe back pain but also found to have rigors, high fever of rectal temp of 105. He works for [employer] and often travels [place] for work. He recently moved back from [place] this summer. He did one week trip to [place], and [place] but denies any illnesses during there trips. He states that he has had intermittent drenching nightsweats that possibly started 5-6 wk ago but have been more consistently at night for the past 7- 10 days.  He thinks that these episodes started before his 2 wk travel that started on Oct 30th to [place] (transit thru [place]), where he had one day of n/v which he attributes to bad food. He then went to [place] ([place]) for 1 week and still had intermittent nightsweats during his trip. He returned on Nov 13th, and had 1 day of diarrhea that self resolved, but still felt weak from these illnesses. He subscribes to roughly 15 lb unintentional weight loss. On visits, stays in hotels, goes to business meetings. Up todate on travel vaccines. Did not take malaria proph  Prior to his trips overseas,  he underwent excision of 20 lipomas on extremities and trunk where he has had previous excisions in the past on 12/09/13. 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 On follow up exam at surgeons office, it appears some nodules were warm and inflammed concerning for infection, where he was given a 7 day course of antibiotics. He states that a few incisions had intermittent drainage. He then  followed up with his PCP who gave him a 7 day course of levofloxacin on 10/19. Patient doesn't know if his nightsweats resolved during the time he was on antibiotics  No sick contacts, no  New pets. Household is in good health.   Past Medical History  Diagnosis Date  . Diabetes mellitus without complication     type 2  . Hyperlipidemia   . Allergic rhinitis   . lipoma   . H/O scoliosis   . Hypertension     Diagnostic exercise tolerance test assessment:04/30/2010 : comments normal -no evidence os ischemia by ST analysis    Allergies: No Known Allergies  MEDICATIONS: . aspirin EC  81 mg Oral Daily  . atorvastatin  10 mg Oral Daily  . enoxaparin (LOVENOX) injection  40 mg Subcutaneous Q24H  . fluticasone  1 puff Inhalation BID  . hydrochlorothiazide  25 mg Oral Daily  . Influenza vac split quadrivalent PF  0.5 mL Intramuscular Tomorrow-1000  . insulin aspart  0-20 Units Subcutaneous TID WC  . insulin aspart  0-5 Units Subcutaneous QHS  . insulin glargine  10 Units Subcutaneous QHS  . lisinopril  20 mg Oral Daily  . methocarbamol (ROBAXIN)  IV  500 mg Intravenous 4 times per day  . pantoprazole  40 mg Oral Q0600  . pneumococcal 23 valent vaccine  0.5 mL Intramuscular Tomorrow-1000  . predniSONE  60 mg Oral Q breakfast    History  Substance  Use Topics  . Smoking status: Never Smoker   . Smokeless tobacco: Never Used  . Alcohol Use: Yes     Comment: rare ,1-2 per week    Family History  Problem Relation Age of Onset  . Heart disease Father      Review of Systems  Constitutional: positive for fever, chills, diaphoresis, activity change, appetite change, fatigue and unexpected weight change.  HENT: Negative for congestion, sore throat, rhinorrhea, sneezing, trouble swallowing and sinus pressure.  Eyes: Negative for photophobia and visual disturbance.  Respiratory: positive for wheezing Negative for cough, chest tightness, shortness of breath, wheezing and stridor.    Cardiovascular: Negative for chest pain, palpitations and leg swelling.  Gastrointestinal: Negative for nausea, vomiting, abdominal pain, diarrhea, constipation, blood in stool, abdominal distention and anal bleeding.  Genitourinary: Negative for dysuria, hematuria, flank pain and difficulty urinating.  Musculoskeletal: Negative for myalgias, back pain, joint swelling, arthralgias and gait problem.  Skin: Negative for color change, pallor, rash and wound.  Neurological: Negative for dizziness, tremors, weakness and light-headedness.  Hematological: Negative for adenopathy. Does not bruise/bleed easily.  Psychiatric/Behavioral: Negative for behavioral problems, confusion, sleep disturbance, dysphoric mood, decreased concentration and agitation.     OBJECTIVE: Temp:  [97.9 F (36.6 C)-105.8 F (41 C)] 98.7 F (37.1 C) (12/01 1300) Pulse Rate:  [73-135] 105 (12/01 1300) Resp:  [16-20] 16 (12/01 1300) BP: (97-141)/(53-83) 97/53 mmHg (12/01 1300) SpO2:  [95 %-100 %] 98 % (12/01 1300) Weight:  [190 lb (86.183 kg)] 190 lb (86.183 kg) (11/30 2100) Physical Exam  Constitutional: He is oriented to person, place, and time. He appears well-developed and well-nourished. No distress.  HENT:  Mouth/Throat: Oropharynx is clear and moist. No oropharyngeal exudate.  Cardiovascular: Normal rate, regular rhythm and normal heart sounds. Exam reveals no gallop and no friction rub.  No murmur heard.  Pulmonary/Chest: Effort normal and breath sounds normal. No respiratory distress. He has no wheezes.  Abdominal: Soft. Bowel sounds are normal. He exhibits no distension. There is no tenderness.  Lymphadenopathy:  He has no cervical adenopathy.  Neurological: He is alert and oriented to person, place, and time.  Skin: Skin is warm and dry. Numerous healed incision/hyperpigmented on arms legs and torso. Psychiatric: He has a normal mood and affect. His behavior is normal.     LABS: Results for orders  placed or performed during the hospital encounter of 02/26/14 (from the past 48 hour(s))  Hemoglobin A1c     Status: Abnormal   Collection Time: 02/27/14  4:37 AM  Result Value Ref Range   Hgb A1c MFr Bld 10.3 (H) <5.7 %    Comment: (NOTE)                                                                       According to the ADA Clinical Practice Recommendations for 2011, when HbA1c is used as a screening test:  >=6.5%   Diagnostic of Diabetes Mellitus           (if abnormal result is confirmed) 5.7-6.4%   Increased risk of developing Diabetes Mellitus References:Diagnosis and Classification of Diabetes Mellitus,Diabetes MWUX,3244,01(UUVOZ 1):S62-S69 and Standards of Medical Care in         Diabetes - 2011,Diabetes DGUY,4034,74 (Suppl  1):S11-S61.    Mean Plasma Glucose 249 (H) <117 mg/dL    Comment: Performed at [facility] metabolic panel     Status: Abnormal   Collection Time: 02/27/14  4:37 AM  Result Value Ref Range   Sodium 134 (L) 137 - 147 mEq/L   Potassium 4.1 3.7 - 5.3 mEq/L   Chloride 96 96 - 112 mEq/L   CO2 23 19 - 32 mEq/L   Glucose, Bld 288 (H) 70 - 99 mg/dL   BUN 16 6 - 23 mg/dL   Creatinine, Ser 0.84 0.50 - 1.35 mg/dL   Calcium 8.9 8.4 - 10.5 mg/dL   GFR calc non Af Amer >90 >90 mL/min   GFR calc Af Amer >90 >90 mL/min    Comment: (NOTE) The eGFR has been calculated using the CKD EPI equation. This calculation has not been validated in all clinical situations. eGFR's persistently <90 mL/min signify possible Chronic Kidney Disease.    Anion gap 15 5 - 15  CBC     Status: Abnormal   Collection Time: 02/27/14  4:37 AM  Result Value Ref Range   WBC 9.8 4.0 - 10.5 K/uL   RBC 4.06 (L) 4.22 - 5.81 MIL/uL   Hemoglobin 13.0 13.0 - 17.0 g/dL   HCT 37.0 (L) 39.0 - 52.0 %   MCV 91.1 78.0 - 100.0 fL   MCH 32.0 26.0 - 34.0 pg   MCHC 35.1 30.0 - 36.0 g/dL   RDW 12.6 11.5 - 15.5 %   Platelets 170 150 - 400 K/uL  Glucose, capillary     Status: Abnormal     Collection Time: 02/27/14  7:55 AM  Result Value Ref Range   Glucose-Capillary 266 (H) 70 - 99 mg/dL  Glucose, capillary     Status: Abnormal   Collection Time: 02/27/14  2:10 PM  Result Value Ref Range   Glucose-Capillary 220 (H) 70 - 99 mg/dL  Glucose, capillary     Status: Abnormal   Collection Time: 02/27/14  3:47 PM  Result Value Ref Range   Glucose-Capillary 252 (H) 70 - 99 mg/dL  Glucose, capillary     Status: Abnormal   Collection Time: 02/27/14  6:24 PM  Result Value Ref Range   Glucose-Capillary 303 (H) 70 - 99 mg/dL  Glucose, capillary     Status: Abnormal   Collection Time: 02/27/14  9:23 PM  Result Value Ref Range   Glucose-Capillary 303 (H) 70 - 99 mg/dL  CBC     Status: Abnormal   Collection Time: 02/28/14  3:55 AM  Result Value Ref Range   WBC 12.6 (H) 4.0 - 10.5 K/uL   RBC 4.17 (L) 4.22 - 5.81 MIL/uL   Hemoglobin 13.0 13.0 - 17.0 g/dL   HCT 36.9 (L) 39.0 - 52.0 %   MCV 88.5 78.0 - 100.0 fL   MCH 31.2 26.0 - 34.0 pg   MCHC 35.2 30.0 - 36.0 g/dL   RDW 12.4 11.5 - 15.5 %   Platelets 185 150 - 400 K/uL  Basic metabolic panel     Status: Abnormal   Collection Time: 02/28/14  3:55 AM  Result Value Ref Range   Sodium 134 (L) 137 - 147 mEq/L   Potassium 4.4 3.7 - 5.3 mEq/L   Chloride 96 96 - 112 mEq/L   CO2 24 19 - 32 mEq/L   Glucose, Bld 273 (H) 70 - 99 mg/dL   BUN 16 6 - 23 mg/dL   Creatinine, Ser 0.91  0.50 - 1.35 mg/dL   Calcium 9.1 8.4 - 10.5 mg/dL   GFR calc non Af Amer >90 >90 mL/min   GFR calc Af Amer >90 >90 mL/min    Comment: (NOTE) The eGFR has been calculated using the CKD EPI equation. This calculation has not been validated in all clinical situations. eGFR's persistently <90 mL/min signify possible Chronic Kidney Disease.    Anion gap 14 5 - 15  Glucose, capillary     Status: Abnormal   Collection Time: 02/28/14  7:58 AM  Result Value Ref Range   Glucose-Capillary 222 (H) 70 - 99 mg/dL  Glucose, capillary     Status: Abnormal    Collection Time: 02/28/14 12:24 PM  Result Value Ref Range   Glucose-Capillary 216 (H) 70 - 99 mg/dL    MICRO: 12/1 blood cx pending IMAGING: Dg Chest 2 View  02/28/2014   CLINICAL DATA:  Fever, history diabetes, hyperlipidemia, hypertension, scoliosis  EXAM: CHEST  2 VIEW  COMPARISON:  01/02/2009  FINDINGS: Lordotic AP positioning.  Normal heart size, mediastinal contours and pulmonary vascularity for technique.  Minimal chronic central peribronchial thickening and hyperinflation.  No definite infiltrate, pleural effusion or pneumothorax.  Bones demineralized.  IMPRESSION: No acute abnormalities.  Resolution of previously seen RIGHT upper lobe infiltrate.   Electronically Signed   By: Lavonia Dana M.D.   On: 02/28/2014 12:05   Mr Lumbar Spine W Wo Contrast  02/27/2014   CLINICAL DATA:  Intractable back pain.  History of scoliosis.  EXAM: MRI LUMBAR SPINE WITHOUT AND WITH CONTRAST  TECHNIQUE: Multiplanar and multiecho pulse sequences of the lumbar spine were obtained without and with intravenous contrast.  CONTRAST:  13m MULTIHANCE GADOBENATE DIMEGLUMINE 529 MG/ML IV SOLN  COMPARISON:  Prior radiograph from 02/26/2014.  FINDINGS: For the purposes of this dictation, the lowest well-formed intervertebral disc spaces presumed to be the L5-S1 level, and there presumed to be 5 lumbar type vertebral bodies.  Dextroscoliosis of the lumbar spine with apex at L2 is present. Vertebral bodies are otherwise normally aligned with preservation of the normal lumbar lordosis. Vertebral body heights are maintained. No acute fracture.  Lobular 2.1 cm T1 hypointense, T2/STIR hyperintense nonenhancing lesion within the central aspect of the sacrum is noted, likely a benign hemangioma. No other focal osseous lesion.  The conus medullaris terminates normally at the L1 level. Signal intensity within the visualized cord is within normal limits. Nerve roots of the cauda equina are unremarkable.  Paraspinous soft tissues are  within normal limits.  No abnormal enhancement identified within the lumbar spine.  T10-11: Seen only on sagittal projection. No disc bulge or disc protrusion. No canal or foraminal stenosis.  T11-12: Seen only on sagittal projection. No disc bulge or disc protrusion. No canal or foraminal stenosis.  T12-L1: No disc bulge or disc protrusion. No significant facet arthropathy. No canal or foraminal stenosis.  L1-2: Mild disc desiccation without significant disc bulge or disc protrusion. No significant canal or foraminal stenosis. No significant facet arthrosis.  L2-3: Mild diffuse disc bulge with disc desiccation. No focal disc herniation. Thecal sac remains widely patent without significant canal stenosis. No significant foraminal narrowing.  L3-4: Degenerative intervertebral disc space narrowing with disc bulge and disc desiccation. There are associated degenerative endplate changes with chronic Schmorl's node present at the anterior aspect of the inferior endplate of L3. Degenerative endplate spurring present anteriorly. There is a superimposed right foraminal shallow disc protrusion, closely approximating/abutting the exiting right L3 nerve root as it  courses out of the right neural foramen (series 7, image 22). There is an associated annular fissure. Mild bilateral facet arthrosis present with asymmetric fluid density present within the left L3-4 facet. Findings are likely related too scoliotic curvature of the lumbar spine, most prevalent at this level. The thecal sac remains widely patent without significant stenosis. There is moderate to severe right with mild left foraminal stenosis.  L4-5: Mild diffuse disc bulge with disc desiccation. No focal disc herniation. Moderate right facet arthrosis. No significant canal narrowing. There is mild left with moderate right foraminal stenosis.  L5-S1: Mild disc desiccation without disc bulge or disc protrusion. No significant facet arthrosis. No canal or foraminal  stenosis.  IMPRESSION: 1. No acute abnormality or abnormal enhancement within the lumbar spine. 2. Dextroscoliosis of the lumbar spine with apex at L2. 3. Degenerative disc disease with superimposed right foraminal disc protrusion at L3-4, abutting the exiting right L3 nerve root as it courses out of the right neural foramen. This could potentially result in nerve root irritation. There is moderate to severe right with mild left foraminal narrowing at this level. 4. Mild degenerative disc disease and facet arthrosis at L4-5 with resultant moderate right and mild left foraminal narrowing.   Electronically Signed   By: Jeannine Boga M.D.   On: 02/27/2014 22:56    Assessment/Plan: 48yo M with FUO with unintentional weight loss and fatigue, returned on 11/13 from 2 wk stay in [place] - will repeat blood cx tomorrow if still having fevers - agree to check malaria smear, since it is unclear if he took malaria prophylaxis to when he went to [place], which would fit timeframe but still low risk since he appears to have been indoors most of the time. Dengue and  zika virus would have fevers and arthralgias which doesn't completely fit with his presentation - will check sed rate and crp - will check ana and RF - will check CMV IgM/IgG - will check protein electropheresis - await culture results to decide if need to get CT chest/abd/pelvis  [facility] [phone]

## 2014-03-01 ENCOUNTER — Inpatient Hospital Stay (HOSPITAL_COMMUNITY): Payer: BC Managed Care – PPO

## 2014-03-01 ENCOUNTER — Encounter (HOSPITAL_COMMUNITY): Payer: Self-pay | Admitting: Pulmonary Disease

## 2014-03-01 ENCOUNTER — Inpatient Hospital Stay (HOSPITAL_COMMUNITY)
Admission: EM | Admit: 2014-03-01 | Discharge: 2014-03-01 | Disposition: A | Discharge status: Home / Self Care | Payer: BC Managed Care – PPO | Source: Home / Self Care | Attending: Neurology | Admitting: Neurology

## 2014-03-01 DIAGNOSIS — R6520 Severe sepsis without septic shock: Secondary | ICD-10-CM

## 2014-03-01 DIAGNOSIS — J9600 Acute respiratory failure, unspecified whether with hypoxia or hypercapnia: Secondary | ICD-10-CM | POA: Insufficient documentation

## 2014-03-01 DIAGNOSIS — R401 Stupor: Secondary | ICD-10-CM

## 2014-03-01 DIAGNOSIS — I429 Cardiomyopathy, unspecified: Secondary | ICD-10-CM

## 2014-03-01 DIAGNOSIS — I369 Nonrheumatic tricuspid valve disorder, unspecified: Secondary | ICD-10-CM

## 2014-03-01 DIAGNOSIS — R569 Unspecified convulsions: Secondary | ICD-10-CM

## 2014-03-01 DIAGNOSIS — R7881 Bacteremia: Secondary | ICD-10-CM | POA: Diagnosis present

## 2014-03-01 DIAGNOSIS — I361 Nonrheumatic tricuspid (valve) insufficiency: Secondary | ICD-10-CM

## 2014-03-01 DIAGNOSIS — J9601 Acute respiratory failure with hypoxia: Secondary | ICD-10-CM | POA: Insufficient documentation

## 2014-03-01 DIAGNOSIS — R4182 Altered mental status, unspecified: Secondary | ICD-10-CM

## 2014-03-01 DIAGNOSIS — A419 Sepsis, unspecified organism: Secondary | ICD-10-CM | POA: Diagnosis present

## 2014-03-01 DIAGNOSIS — I340 Nonrheumatic mitral (valve) insufficiency: Secondary | ICD-10-CM

## 2014-03-01 DIAGNOSIS — I339 Acute and subacute endocarditis, unspecified: Secondary | ICD-10-CM

## 2014-03-01 DIAGNOSIS — A499 Bacterial infection, unspecified: Secondary | ICD-10-CM

## 2014-03-01 DIAGNOSIS — I639 Cerebral infarction, unspecified: Secondary | ICD-10-CM | POA: Diagnosis present

## 2014-03-01 DIAGNOSIS — E440 Moderate protein-calorie malnutrition: Secondary | ICD-10-CM | POA: Insufficient documentation

## 2014-03-01 LAB — CBC WITH DIFFERENTIAL/PLATELET
BASOS PCT: 0 % (ref 0–1)
Basophils Absolute: 0 10*3/uL (ref 0.0–0.1)
Basophils Absolute: 0 10*3/uL (ref 0.0–0.1)
Basophils Relative: 0 % (ref 0–1)
EOS PCT: 0 % (ref 0–5)
EOS PCT: 0 % (ref 0–5)
Eosinophils Absolute: 0 10*3/uL (ref 0.0–0.7)
Eosinophils Absolute: 0 10*3/uL (ref 0.0–0.7)
HCT: 35.4 % — ABNORMAL LOW (ref 39.0–52.0)
HEMATOCRIT: 35.9 % — AB (ref 39.0–52.0)
Hemoglobin: 12.1 g/dL — ABNORMAL LOW (ref 13.0–17.0)
Hemoglobin: 12.3 g/dL — ABNORMAL LOW (ref 13.0–17.0)
LYMPHS ABS: 0.3 10*3/uL — AB (ref 0.7–4.0)
LYMPHS PCT: 1 % — AB (ref 12–46)
Lymphocytes Relative: 1 % — ABNORMAL LOW (ref 12–46)
Lymphs Abs: 0.2 10*3/uL — ABNORMAL LOW (ref 0.7–4.0)
MCH: 30.8 pg (ref 26.0–34.0)
MCH: 31.1 pg (ref 26.0–34.0)
MCHC: 34.2 g/dL (ref 30.0–36.0)
MCHC: 34.3 g/dL (ref 30.0–36.0)
MCV: 90.1 fL (ref 78.0–100.0)
MCV: 90.9 fL (ref 78.0–100.0)
MONO ABS: 1 10*3/uL (ref 0.1–1.0)
MONO ABS: 0.4 10*3/uL (ref 0.1–1.0)
MONOS PCT: 4 % (ref 3–12)
Monocytes Relative: 2 % — ABNORMAL LOW (ref 3–12)
NEUTROS PCT: 95 % — AB (ref 43–77)
Neutro Abs: 23.6 10*3/uL — ABNORMAL HIGH (ref 1.7–7.7)
Neutro Abs: 21.1 10*3/uL — ABNORMAL HIGH (ref 1.7–7.7)
Neutrophils Relative %: 97 % — ABNORMAL HIGH (ref 43–77)
PLATELETS: 186 10*3/uL (ref 150–400)
PLATELETS: 168 10*3/uL (ref 150–400)
RBC: 3.93 MIL/uL — AB (ref 4.22–5.81)
RBC: 3.95 MIL/uL — AB (ref 4.22–5.81)
RDW: 12.7 % (ref 11.5–15.5)
RDW: 12.8 % (ref 11.5–15.5)
WBC: 24.8 10*3/uL — AB (ref 4.0–10.5)
WBC: 21.7 10*3/uL — ABNORMAL HIGH (ref 4.0–10.5)

## 2014-03-01 LAB — BLOOD GAS, ARTERIAL
ACID-BASE DEFICIT: 3.5 mmol/L — AB (ref 0.0–2.0)
Acid-base deficit: 4.2 mmol/L — ABNORMAL HIGH (ref 0.0–2.0)
Bicarbonate: 18.6 mEq/L — ABNORMAL LOW (ref 20.0–24.0)
Bicarbonate: 18.7 mEq/L — ABNORMAL LOW (ref 20.0–24.0)
DRAWN BY: 307971
Drawn by: 422461
FIO2: 0.21 %
FIO2: 1 %
LHR: 18 {breaths}/min
MECHVT: 620 mL
O2 Saturation: 94.4 %
O2 Saturation: 99.8 %
PATIENT TEMPERATURE: 103.6
PEEP/CPAP: 5 cmH2O
PH ART: 7.379 (ref 7.350–7.450)
Patient temperature: 102.9
TCO2: 16.5 mmol/L (ref 0–100)
TCO2: 16.3 mmol/L (ref 0–100)
pCO2 arterial: 30.3 mmHg — ABNORMAL LOW (ref 35.0–45.0)
pCO2 arterial: 33.4 mmHg — ABNORMAL LOW (ref 35.0–45.0)
pH, Arterial: 7.42 (ref 7.350–7.450)
pO2, Arterial: 81.9 mmHg (ref 80.0–100.0)
pO2, Arterial: 501 mmHg — ABNORMAL HIGH (ref 80.0–100.0)

## 2014-03-01 LAB — COMPREHENSIVE METABOLIC PANEL
ALT: 34 U/L (ref 0–53)
ANION GAP: 21 — AB (ref 5–15)
AST: 25 U/L (ref 0–37)
Albumin: 2.4 g/dL — ABNORMAL LOW (ref 3.5–5.2)
Alkaline Phosphatase: 93 U/L (ref 39–117)
BUN: 19 mg/dL (ref 6–23)
CALCIUM: 8.7 mg/dL (ref 8.4–10.5)
CO2: 18 mEq/L — ABNORMAL LOW (ref 19–32)
Chloride: 93 mEq/L — ABNORMAL LOW (ref 96–112)
Creatinine, Ser: 1.07 mg/dL (ref 0.50–1.35)
GFR calc Af Amer: >90 mL/min (ref >90)
GFR calc non Af Amer: 82 mL/min — ABNORMAL LOW (ref >90)
Glucose, Bld: 408 mg/dL — ABNORMAL HIGH (ref 70–99)
Potassium: 4 mEq/L (ref 3.7–5.3)
Sodium: 132 mEq/L — ABNORMAL LOW (ref 137–147)
Total Bilirubin: 0.9 mg/dL (ref 0.3–1.2)
Total Protein: 6.7 g/dL (ref 6.0–8.3)

## 2014-03-01 LAB — GLUCOSE, CAPILLARY
GLUCOSE-CAPILLARY: 376 mg/dL — AB (ref 70–99)
GLUCOSE-CAPILLARY: 230 mg/dL — AB (ref 70–99)
Glucose-Capillary: 248 mg/dL — ABNORMAL HIGH (ref 70–99)
Glucose-Capillary: 273 mg/dL — ABNORMAL HIGH (ref 70–99)
Glucose-Capillary: 345 mg/dL — ABNORMAL HIGH (ref 70–99)
Glucose-Capillary: 380 mg/dL — ABNORMAL HIGH (ref 70–99)
Glucose-Capillary: 263 mg/dL — ABNORMAL HIGH (ref 70–99)

## 2014-03-01 LAB — BASIC METABOLIC PANEL
Anion gap: 22 — ABNORMAL HIGH (ref 5–15)
BUN: 18 mg/dL (ref 6–23)
CALCIUM: 8.7 mg/dL (ref 8.4–10.5)
CHLORIDE: 95 meq/L — AB (ref 96–112)
CO2: 17 meq/L — AB (ref 19–32)
CREATININE: 0.99 mg/dL (ref 0.50–1.35)
GFR calc Af Amer: >90 mL/min (ref >90)
GFR calc non Af Amer: >90 mL/min (ref >90)
Glucose, Bld: 319 mg/dL — ABNORMAL HIGH (ref 70–99)
Potassium: 3.3 mEq/L — ABNORMAL LOW (ref 3.7–5.3)
Sodium: 134 mEq/L — ABNORMAL LOW (ref 137–147)

## 2014-03-01 LAB — INFLUENZA PANEL BY PCR (TYPE A & B)
H1N1FLUPCR: NOT DETECTED
INFLAPCR: NEGATIVE
INFLBPCR: NEGATIVE

## 2014-03-01 LAB — MALARIA SMEAR: Special Requests: NORMAL

## 2014-03-01 LAB — ANA: Anti Nuclear Antibody(ANA): NEGATIVE

## 2014-03-01 LAB — CMV IGM

## 2014-03-01 LAB — STREP PNEUMONIAE URINARY ANTIGEN: STREP PNEUMO URINARY ANTIGEN: NEGATIVE

## 2014-03-01 LAB — MRSA PCR SCREENING: MRSA by PCR: NEGATIVE

## 2014-03-01 LAB — CMV ANTIBODY, IGG (EIA): CMV Ab - IgG: 9.4 U/mL — ABNORMAL HIGH (ref <0.60)

## 2014-03-01 LAB — PROLACTIN: Prolactin: 15.4 ng/mL (ref 2.1–17.1)

## 2014-03-01 LAB — LACTIC ACID, PLASMA
LACTIC ACID, VENOUS: 2.3 mmol/L — AB (ref 0.5–2.2)
LACTIC ACID, VENOUS: 3.2 mmol/L — AB (ref 0.5–2.2)

## 2014-03-01 LAB — PROCALCITONIN: Procalcitonin: 45.27 ng/mL

## 2014-03-01 MED ORDER — VITAL HIGH PROTEIN PO LIQD
1000.0000 mL | ORAL | Status: DC
  Filled 2014-03-01: qty 1000

## 2014-03-01 MED ORDER — DEXTROSE 5 % IV SOLN
2.0000 g | Freq: Three times a day (TID) | INTRAVENOUS | Status: DC
  Administered 2014-03-01: 2 g via INTRAVENOUS
  Filled 2014-03-01: qty 2

## 2014-03-01 MED ORDER — CETYLPYRIDINIUM CHLORIDE 0.05 % MT LIQD
7.0000 mL | Freq: Four times a day (QID) | OROMUCOSAL | Status: DC
  Administered 2014-03-01 – 2014-03-02 (×4): 7 mL via OROMUCOSAL

## 2014-03-01 MED ORDER — HYDRALAZINE HCL 20 MG/ML IJ SOLN: 5.0000 mg | INTRAMUSCULAR | Status: DC | PRN

## 2014-03-01 MED ORDER — LIDOCAINE HCL (CARDIAC) 20 MG/ML IV SOLN
INTRAVENOUS | Status: AC
  Filled 2014-03-01: qty 5

## 2014-03-01 MED ORDER — ETOMIDATE 2 MG/ML IV SOLN
INTRAVENOUS | Status: AC
  Administered 2014-03-01: 20 mg
  Filled 2014-03-01: qty 20

## 2014-03-01 MED ORDER — FENTANYL BOLUS VIA INFUSION
50.0000 ug | INTRAVENOUS | Status: DC | PRN
  Filled 2014-03-01: qty 50

## 2014-03-01 MED ORDER — GADOBENATE DIMEGLUMINE 529 MG/ML IV SOLN
20.0000 mL | Freq: Once | INTRAVENOUS | Status: AC | PRN
  Administered 2014-03-01: 18 mL via INTRAVENOUS

## 2014-03-01 MED ORDER — VITAL 1.5 CAL PO LIQD
1000.0000 mL | ORAL | Status: DC
  Administered 2014-03-01: 1000 mL
  Filled 2014-03-01 (×5): qty 1000

## 2014-03-01 MED ORDER — LORAZEPAM 2 MG/ML IJ SOLN
2.0000 mg | INTRAMUSCULAR | Status: DC | PRN
  Administered 2014-03-01: 1 mg via INTRAVENOUS
  Administered 2014-03-01: 2 mg via INTRAVENOUS
  Filled 2014-03-01 (×3): qty 1

## 2014-03-01 MED ORDER — CEFTRIAXONE SODIUM IN DEXTROSE 40 MG/ML IV SOLN
2.0000 g | Freq: Two times a day (BID) | INTRAVENOUS | Status: DC
  Administered 2014-03-01 – 2014-03-08 (×15): 2 g via INTRAVENOUS
  Filled 2014-03-01 (×18): qty 50

## 2014-03-01 MED ORDER — FENTANYL CITRATE 0.05 MG/ML IJ SOLN
INTRAMUSCULAR | Status: AC
  Administered 2014-03-01: 50 ug
  Filled 2014-03-01: qty 4

## 2014-03-01 MED ORDER — PANTOPRAZOLE SODIUM 40 MG PO PACK
40.0000 mg | PACK | Freq: Every day | ORAL | Status: DC
  Administered 2014-03-01: 40 mg
  Filled 2014-03-01 (×2): qty 20

## 2014-03-01 MED ORDER — FENTANYL CITRATE 0.05 MG/ML IJ SOLN
50.0000 ug | Freq: Once | INTRAMUSCULAR | Status: AC
  Administered 2014-03-01: 50 ug via INTRAVENOUS

## 2014-03-01 MED ORDER — SUCCINYLCHOLINE CHLORIDE 20 MG/ML IJ SOLN
INTRAMUSCULAR | Status: AC
  Filled 2014-03-01: qty 1

## 2014-03-01 MED ORDER — INSULIN ASPART 100 UNIT/ML ~~LOC~~ SOLN
0.0000 [IU] | SUBCUTANEOUS | Status: DC
  Administered 2014-03-01: 20 [IU] via SUBCUTANEOUS
  Administered 2014-03-01: 11 [IU] via SUBCUTANEOUS
  Administered 2014-03-01: 7 [IU] via SUBCUTANEOUS
  Administered 2014-03-01: 20 [IU] via SUBCUTANEOUS
  Administered 2014-03-02 (×2): 15 [IU] via SUBCUTANEOUS
  Administered 2014-03-02: 20 [IU] via SUBCUTANEOUS

## 2014-03-01 MED ORDER — VANCOMYCIN HCL IN DEXTROSE 1-5 GM/200ML-% IV SOLN
1000.0000 mg | Freq: Three times a day (TID) | INTRAVENOUS | Status: DC
  Administered 2014-03-01 – 2014-03-02 (×5): 1000 mg via INTRAVENOUS
  Filled 2014-03-01 (×7): qty 200

## 2014-03-01 MED ORDER — KETOROLAC TROMETHAMINE 30 MG/ML IJ SOLN
30.0000 mg | INTRAMUSCULAR | Status: AC
  Administered 2014-03-01: 30 mg via INTRAVENOUS

## 2014-03-01 MED ORDER — LORAZEPAM 2 MG/ML IJ SOLN
1.0000 mg | Freq: Once | INTRAMUSCULAR | Status: AC
  Administered 2014-03-01: 1 mg via INTRAVENOUS

## 2014-03-01 MED ORDER — PHENYTOIN SODIUM 50 MG/ML IJ SOLN
862.0000 mg | Freq: Once | INTRAMUSCULAR | Status: DC
  Administered 2014-03-01: 862 mg via INTRAVENOUS
  Filled 2014-03-01: qty 17.2

## 2014-03-01 MED ORDER — DOCUSATE SODIUM 50 MG/5ML PO LIQD
50.0000 mg | Freq: Every day | ORAL | Status: DC
  Administered 2014-03-01: 50 mg via ORAL
  Filled 2014-03-01 (×2): qty 10

## 2014-03-01 MED ORDER — FOSPHENYTOIN SODIUM 500 MG PE/10ML IJ SOLN: 10.0000 mg/kg | Freq: Once | INTRAMUSCULAR | Status: DC

## 2014-03-01 MED ORDER — DEXAMETHASONE SODIUM PHOSPHATE 4 MG/ML IJ SOLN
12.0000 mg | Freq: Four times a day (QID) | INTRAMUSCULAR | Status: DC
  Administered 2014-03-01 – 2014-03-02 (×4): 12 mg via INTRAVENOUS
  Filled 2014-03-01 (×8): qty 3

## 2014-03-01 MED ORDER — CHLORHEXIDINE GLUCONATE 0.12 % MT SOLN
15.0000 mL | Freq: Two times a day (BID) | OROMUCOSAL | Status: DC
Start: 2014-03-01 — End: 2014-03-02
  Administered 2014-03-01 – 2014-03-02 (×3): 15 mL via OROMUCOSAL
  Filled 2014-03-01 (×3): qty 15

## 2014-03-01 MED ORDER — LEVETIRACETAM IN NACL 1000 MG/100ML IV SOLN
1000.0000 mg | INTRAVENOUS | Status: AC
  Administered 2014-03-01: 1000 mg via INTRAVENOUS
  Filled 2014-03-01: qty 100

## 2014-03-01 MED ORDER — FENTANYL CITRATE 0.05 MG/ML IJ SOLN
25.0000 ug/h | INTRAMUSCULAR | Status: DC
  Administered 2014-03-01: 100 ug/h via INTRAVENOUS
  Administered 2014-03-01: 50 ug/h via INTRAVENOUS
  Administered 2014-03-02: 100 ug/h via INTRAVENOUS
  Filled 2014-03-01 (×2): qty 50

## 2014-03-01 MED ORDER — INSULIN GLARGINE 100 UNIT/ML ~~LOC~~ SOLN
15.0000 [IU] | Freq: Every day | SUBCUTANEOUS | Status: DC
  Administered 2014-03-01: 15 [IU] via SUBCUTANEOUS
  Filled 2014-03-01 (×3): qty 0.15

## 2014-03-01 MED ORDER — ROCURONIUM BROMIDE 50 MG/5ML IV SOLN
INTRAVENOUS | Status: AC
  Filled 2014-03-01: qty 2

## 2014-03-01 MED ORDER — MIDAZOLAM HCL 2 MG/2ML IJ SOLN
INTRAMUSCULAR | Status: AC
  Administered 2014-03-01: 2 mg
  Filled 2014-03-01: qty 4

## 2014-03-01 MED ORDER — HYDRALAZINE HCL 20 MG/ML IJ SOLN: 10.0000 mg | INTRAMUSCULAR | Status: DC | PRN

## 2014-03-01 NOTE — Progress Notes (Signed)
EEG completed; results pending.    

## 2014-03-01 NOTE — Progress Notes (Signed)
PT Cancellation Note  Patient Details Name: Arlin Sass MRN: [id number] DOB: Jan 15, 1966   Cancelled Treatment:     PT eval deferred this date 2* pt condition.  Pt intubated this am.  Will follow.   Lijah Bourque 03/01/2014, 11:07 AM

## 2014-03-01 NOTE — CV Procedure (Signed)
   Transesophageal Echocardiogram Note  [name] [id number] 11-27-1965  Procedure: Transesophageal Echocardiogram Indications: Sepsis, respiratory failure, evaluate for endocarditis  Procedure Details Consent: Obtained Time Out: Verified patient identification, verified procedure, site/side was marked, verified correct patient position, special equipment/implants available, Radiology Safety Procedures followed,  medications/allergies/relevent history reviewed, required imaging and test results available.  Performed  Medications: Fentanyl: iv drip by ICU nurse  Left Ventrical:  LVEF 35%, global hypokinesis and dyskinesis of the septum.  Mitral Valve: There is a large echodensity on the P2 scallop of the posterior leaflet of the mitral valve, partially involving P1 and P3 scallops as well. The echodensity measures 1 x 1.8 cm and appears to have translucent area in the middle suspicious for an abscess. There are multiple jets of mitral regurgitation, estimated MR is at least moderate, there is reversal of flow in the right pulmonary upper vein but not left upper PV.   Aortic Valve: normal, no vegetation, no AI.   Tricuspid Valve: normal, no vegetation, mild TR.  Pulmonic Valve: normal, no vegetation, no PR.  Left Atrium/ Left atrial appendage: dilated LA, no thrombus in LAA.   Atrial septum: no ASD or PFO by color Doppler.  Aorta: mild AS plaque.    Complications: No apparent complications Patient did tolerate procedure well.  The findings were discussed with Dr Pennie Banter and patient's wife on 03/01/2014 at 12:30 pm. CT surgery consultation is recommended.  Full report to follow.   Dorothy Spark, MD, [facility] 03/01/2014, 12:40 PM

## 2014-03-01 NOTE — Progress Notes (Signed)
Attempted to get sputum sample via in line catheter and mucous trap. Unable to obtain sample. Patient very dry with clear BBS

## 2014-03-01 NOTE — Consult Note (Signed)
[address]       [place] [address]             [phone]        Casimiro Albritton Jerome Medical Record #[id number] Date of Birth: March 22, 1966  Referring: No ref. provider found Primary Care: Orpah Melter, MD  Chief Complaint:    Chief Complaint  Patient presents with  . Back Pain    History of Present Illness:     Patient is a 48 year old male with previous history of hypertension diabetes and positive family history for coronary artery disease. He has no history of valvular disease . In mid-September he had multiple lump palm was removed from his arms and legs , some of these infections became infected and he was started on a course of antibiotics the most recent mid-October for 10 days . He then traveled both to [place] and [place] for work and return November 14 . He had noted while traveling being tired and having occasional night fevers and sweats . He continued to feel tired in 1 week ago saw his primary care doctor and was noted to have a markedly elevated glucose . On November 20 he was noted to [facility]'t long hospital with severe back spasm . MRI of the spine was performed . Then Over last night he became tachycardic noted he was not feeling well, BC returned positive for gram positive cocci in chains and placed on ABX Temperature was noted to raise to 103.6. H Patient was brought to CT scan where he was noted to have a peroid of arms flexed and mild tremor--there was question of riggers versus seizure. On the floor he had another episode of generalized tremulous activity and "teeth chattering" which again was possibly riggers versus seizure. During this time he was not alert and only moaning while moving from bed to bed. He was intubated due to airway compromise. MRI showed embolic shower. Patient was transferred to [facility] after TEE suggested mitral valve endocarditis.   Current Activity/ Functional Status: Patient is independent with  mobility/ambulation, transfers, ADL's, IADL's.   Zubrod Score: At the time ofadmission this patient's most appropriate activity status/level should be described as: [x]     0    Normal activity, no symptoms []     1    Restricted in physical strenuous activity but ambulatory, able to do out light work []     2    Ambulatory and capable of self care, unable to do work activities, up and about                 more than 50%  Of the time                            []     3    Only limited self care, in bed greater than 50% of waking hours []     4    Completely disabled, no self care, confined to bed or chair []     5    Moribund  Past Medical History  Diagnosis Date  . Diabetes mellitus without complication     type 2  . Hyperlipidemia   . Allergic rhinitis   . lipoma   . H/O scoliosis   . Hypertension     Diagnostic exercise tolerance test assessment:04/30/2010 : comments normal -no evidence os ischemia by ST analysis  . Solitary kidney   . Wilm's  tumor (nephroblastoma)     Past Surgical History  Procedure Laterality Date  . Lipoma excision    . Total nephrectomy Left   . Thyroid surgery    . Knee surgery      History  Smoking status  . Never Smoker   Smokeless tobacco  . Never Used    History  Alcohol Use  . Yes    Comment: rare ,1-2 per week    History   Social History  . Marital Status: Married    Spouse Name: N/A    Number of Children: One  son 72 years old  . Years of Education: N/A   Occupational History  . Executive finiance    Social History Main Topics  . Smoking status: Never Smoker   . Smokeless tobacco: Never Used  . Alcohol Use: Yes     Comment: rare ,1-2 per week  . Drug Use: No    Social History Narrative   Never smoked    Alcohol yes, rare ,1-2 per week   No recreational drugs   Occupation-  Geneticist, molecular and other executive courses   Marital status - married     Children 1 boy          No Known Allergies  Current  Facility-Administered Medications  Medication Dose Route Frequency Provider Last Rate Last Dose  . 0.9 %  sodium chloride infusion   Intravenous Continuous [name], NP 125 mL/hr at 03/01/14 1359 125 mL/hr at 03/01/14 1359  . acetaminophen (TYLENOL) suppository 650 mg  650 mg Rectal Q6H PRN Theressa Millard, MD   650 mg at 03/01/14 [phone]  . albuterol (PROVENTIL) (2.5 MG/3ML) 0.083% nebulizer solution 2.5 mg  2.5 mg Inhalation Q6H PRN Theressa Millard, MD      . alum & mag hydroxide-simeth (MAALOX/MYLANTA) 200-200-20 MG/5ML suspension 30 mL  30 mL Oral Q6H PRN Theressa Millard, MD      . antiseptic oral rinse (CPC / CETYLPYRIDINIUM CHLORIDE 0.05%) solution 7 mL  7 mL Mouth Rinse QID [name], NP   7 mL at 03/01/14 1200  . cefTRIAXone (ROCEPHIN) 2 g in dextrose 5 % 50 mL IVPB - Premix  2 g Intravenous Q12H [name], NP   2 g at 03/01/14 1031  . chlorhexidine (PERIDEX) 0.12 % solution 15 mL  15 mL Mouth Rinse BID [name], NP   15 mL at 03/01/14 1033  . dexamethasone (DECADRON) injection 12 mg  12 mg Intravenous 4 times per day Roland Rack, MD   12 mg at 03/01/14 1713  . docusate (COLACE) 50 MG/5ML liquid 50 mg  50 mg Oral Daily [name], NP   50 mg at 03/01/14 0946  . enoxaparin (LOVENOX) injection 40 mg  40 mg Subcutaneous Q24H Theressa Millard, MD   40 mg at 03/01/14 0545  . feeding supplement (VITAL 1.5 CAL) liquid 1,000 mL  1,000 mL Per Tube Continuous Hazle Coca, RD      . fentaNYL (SUBLIMAZE) 2,500 mcg in sodium chloride 0.9 % 250 mL (10 mcg/mL) infusion  25-400 mcg/hr Intravenous Continuous [name], NP 10 mL/hr at 03/01/14 1700 100 mcg/hr at 03/01/14 1700  . fentaNYL (SUBLIMAZE) bolus via infusion 50 mcg  50 mcg Intravenous Q1H PRN [name], NP      . fluticasone (FLOVENT HFA) 44 MCG/ACT inhaler 1 puff  1 puff Inhalation BID Theressa Millard, MD  1 puff at 02/28/14 2014  . hydrALAZINE (APRESOLINE) injection 10 mg  10 mg  Intravenous Q4H PRN [name], NP      . Influenza vac split quadrivalent PF (FLUARIX) injection 0.5 mL  0.5 mL Intramuscular Tomorrow-1000 Theressa Millard, MD   0.5 mL at 03/01/14 1035  . insulin aspart (novoLOG) injection 0-20 Units  0-20 Units Subcutaneous Q4H Gardiner Barefoot, NP   11 Units at 03/01/14 1713  . insulin glargine (LANTUS) injection 15 Units  15 Units Subcutaneous QHS [name], NP      . insulin starter kit- pen needles (English) 1 kit  1 kit Other Once Shanker Kristeen Mans, MD      . lidocaine (cardiac) 100 mg/62m (XYLOCAINE) 20 MG/ML injection 2%           . LORazepam (ATIVAN) injection 2 mg  2 mg Intravenous Q4H PRN KGardiner Barefoot NP   1 mg at 03/01/14 0806  . methocarbamol (ROBAXIN) 500 mg in dextrose 5 % 50 mL IVPB  500 mg Intravenous Q8H PRN HTheressa Millard MD   500 mg at 02/27/14 1827  . ondansetron (ZOFRAN) injection 4 mg  4 mg Intravenous Q6H PRN HTheressa Millard MD      . pantoprazole sodium (PROTONIX) 40 mg/20 mL oral suspension 40 mg  40 mg Per Tube Q1200 BDonita Brooks NP   40 mg at 03/01/14 1327  . pneumococcal 23 valent vaccine (PNU-IMMUNE) injection 0.5 mL  0.5 mL Intramuscular Tomorrow-1000 HTheressa Millard MD   0.5 mL at 03/01/14 1034  . rocuronium (ZEMURON) 50 MG/5ML injection           . succinylcholine (ANECTINE) 20 MG/ML injection           . vancomycin (VANCOCIN) IVPB 1000 mg/200 mL premix  1,000 mg Intravenous Q8H SJonetta Osgood MD   1,000 mg at 03/01/14 1241    Prescriptions prior to admission  Medication Sig Dispense Refill Last Dose  . acetaminophen (TYLENOL) 500 MG tablet Take 1,000 mg by mouth every 6 (six) hours as needed for moderate pain.   02/26/2014 at Unknown time  . albuterol (PROVENTIL HFA;VENTOLIN HFA) 108 (90 BASE) MCG/ACT inhaler Inhale 1 puff into the lungs every 6 (six) hours as needed for wheezing or shortness of breath.   02/25/2014 at Unknown time  . aspirin EC 81 MG tablet Take 81 mg by mouth  daily.   02/25/2014 at Unknown time  . atorvastatin (LIPITOR) 10 MG tablet Take 10 mg by mouth daily.   02/26/2014 at Unknown time  . diclofenac sodium (VOLTAREN) 1 % GEL Apply 2 g topically 4 (four) times daily.   02/26/2014 at Unknown time  . hydrochlorothiazide (HYDRODIURIL) 25 MG tablet Take 25 mg by mouth daily.   02/25/2014 at Unknown time  . lisinopril (PRINIVIL,ZESTRIL) 20 MG tablet Take 20 mg by mouth daily.   02/26/2014 at 0800  . Magnesium Salicylate Tetrahyd (DOANS EXTRA STRENGTH) 580 MG TABS Take 2 tablets by mouth every 6 (six) hours as needed (pain).   02/26/2014 at Unknown time  . metFORMIN (GLUCOPHAGE) 500 MG tablet Take 500 mg by mouth 2 (two) times daily with a meal.   02/26/2014 at Unknown time  . mometasone (ASMANEX) 220 MCG/INH inhaler Inhale 1 puff into the lungs daily.   02/26/2014 at Unknown time  . repaglinide (PRANDIN) 0.5 MG tablet Take 0.5 mg by mouth 2 (two) times daily before a meal.   02/26/2014 at  0830    Family History  Problem Relation Age of Onset  . Heart disease Father      Review of Systems:  Unable to obtain ros on amdission reviewed and discussed with patients family    Cardiac Review of Systems: Y or N  Chest Pain [    ]  Resting SOB [   ] Exertional SOB  [  ]  Orthopnea [  ]   Pedal Edema [   ]    Palpitations [  ] Syncope  [  ]   Presyncope [   ]  General Review of Systems: [Y] = yes [  ]=no Constitional: recent weight change [  ]; anorexia [  ]; fatigue [  ]; nausea [  ]; night sweats [  ]; fever [  ]; or chills [  ]                                                               Dental: poor dentition[  ]; Last Dentist visit:   Eye : blurred vision [  ]; diplopia [   ]; vision changes [  ];  Amaurosis fugax[  ]; Resp: cough [  ];  wheezing[  ];  hemoptysis[  ]; shortness of breath[  ]; paroxysmal nocturnal dyspnea[  ]; dyspnea on exertion[  ]; or orthopnea[  ];  GI:  gallstones[  ], vomiting[  ];  dysphagia[  ]; melena[  ];  hematochezia [  ];  heartburn[  ];   Hx of  Colonoscopy[  ]; GU: kidney stones [  ]; hematuria[  ];   dysuria [  ];  nocturia[  ];  history of     obstruction [  ]; urinary frequency [  ]             Skin: rash, swelling[  ];, hair loss[  ];  peripheral edema[  ];  or itching[  ]; Musculosketetal: myalgias[  ];  joint swelling[  ];  joint erythema[  ];  joint pain[  ];  back pain[  ];  Heme/Lymph: bruising[  ];  bleeding[  ];  anemia[  ];  Neuro: TIA[  ];  headaches[  ];  stroke[  ];  vertigo[  ];  seizures[  ];   paresthesias[  ];  difficulty walking[  ];  Psych:depression[  ]; anxiety[  ];  Endocrine: diabetes[  ];  thyroid dysfunction[  ];  Immunizations: Flu [  ]; Pneumococcal[  ];  Other:  Physical Exam: BP 101/69 mmHg  Pulse 102  Temp(Src) 99.5 F (37.5 C) (Rectal)  Resp 15  Ht 6' (1.829 m)  Wt 192 lb 7.4 oz (87.3 kg)  BMI 26.10 kg/m2  SpO2 99%  General appearance: sedated on vent unresponsive Neurologic: sedated Heart: regular rate and rhythm, S1, S2 normal, no murmur, click, rub or gallop Lungs: diminished breath sounds bibasilar Abdomen: soft, non-tender; bowel sounds normal; no masses,  no organomegaly Extremities: extremities normal, atraumatic, no cyanosis or edema and Homans sign is negative, no sign of DVT No evidence of peripheral emboli in the hands or feet or digits Previous incisions for excisions of lipomas show no evidence of infection currently  Diagnostic Studies & Laboratory data:     Recent Radiology Findings:  Dg Chest 2 View  02/28/2014   CLINICAL DATA:  Fever, history diabetes, hyperlipidemia, hypertension, scoliosis  EXAM: CHEST  2 VIEW  COMPARISON:  01/02/2009  FINDINGS: Lordotic AP positioning.  Normal heart size, mediastinal contours and pulmonary vascularity for technique.  Minimal chronic central peribronchial thickening and hyperinflation.  No definite infiltrate, pleural effusion or pneumothorax.  Bones demineralized.  IMPRESSION: No acute abnormalities.   Resolution of previously seen RIGHT upper lobe infiltrate.   Electronically Signed   By: Lavonia Dana M.D.   On: 02/28/2014 12:05   Ct Head Wo Contrast  03/01/2014   CLINICAL DATA:  Became unresponsive less than 1 hour ago. Looking constantly to the left. Initial encounter.  EXAM: CT HEAD WITHOUT CONTRAST  TECHNIQUE: Contiguous axial images were obtained from the base of the skull through the vertex without intravenous contrast.  COMPARISON:  None.  FINDINGS: There is no evidence of acute infarction, mass lesion, or intra- or extra-axial hemorrhage on CT.  Mild periventricular white matter change likely reflects small vessel ischemic microangiopathy.  The posterior fossa, including the cerebellum, brainstem and fourth ventricle, is within normal limits. The third and lateral ventricles, and basal ganglia are unremarkable in appearance. The cerebral hemispheres are symmetric in appearance, with normal gray-white differentiation. No mass effect or midline shift is seen.  There is no evidence of fracture; visualized osseous structures are unremarkable in appearance. The orbits are within normal limits. The paranasal sinuses and mastoid air cells are well-aerated. No significant soft tissue abnormalities are seen.  IMPRESSION: 1. No acute intracranial pathology seen on CT. 2. Mild small vessel ischemic microangiopathy.   Electronically Signed   By: Garald Balding M.D.   On: 03/01/2014 04:47   Mr [name] IR Contrast  03/01/2014   CLINICAL DATA:  Seizure. Right sided visual neglect. Transient alteration of awareness  EXAM: MRI HEAD WITHOUT AND WITH CONTRAST  TECHNIQUE: Multiplanar, multiecho pulse sequences of the brain and surrounding structures were obtained without and with intravenous contrast.  CONTRAST:  27m MULTIHANCE GADOBENATE DIMEGLUMINE 529 MG/ML IV SOLN  COMPARISON:  CT head 03/01/2014  FINDINGS: The patient was sedated with Ativan however remained agitated and had difficulty holding still. There is  progressive motion on the study and the patient was not able to complete the final postcontrast coronal sequence.  Multiple areas of acute infarct are noted. Small areas are restricted diffusion are present bilaterally including the right cerebellum, left occipital pole, left medial occipital lobe, left parietal lobe, right parietal periventricular white matter, head of caudate on the left. This pattern suggests multiple small emboli.  Ventricle size is normal. No cortical infarct. Cerebral volume is normal.  Microhemorrhage in the left external capsule. Question history of hypertension. No other areas of hemorrhage.  Postcontrast images degraded by motion however no enhancing lesions are identified.  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  Chronic microhemorrhage in the left external capsule. Question hypertension   Electronically Signed   By: CFranchot GalloM.D.   On: 03/01/2014 08:07   Mr Lumbar Spine W Wo Contrast  02/27/2014   CLINICAL DATA:  Intractable back pain.  History of scoliosis.  EXAM: MRI LUMBAR SPINE WITHOUT AND WITH CONTRAST  TECHNIQUE: Multiplanar and multiecho pulse sequences of the lumbar spine were obtained without and with intravenous contrast.  CONTRAST:  172mMULTIHANCE GADOBENATE DIMEGLUMINE 529 MG/ML IV SOLN  COMPARISON:  Prior radiograph from 02/26/2014.  FINDINGS: For the  purposes of this dictation, the lowest well-formed intervertebral disc spaces presumed to be the L5-S1 level, and there presumed to be 5 lumbar type vertebral bodies.  Dextroscoliosis of the lumbar spine with apex at L2 is present. Vertebral bodies are otherwise normally aligned with preservation of the normal lumbar lordosis. Vertebral body heights are maintained. No acute fracture.  Lobular 2.1 cm T1 hypointense, T2/STIR hyperintense nonenhancing lesion within the central aspect of the sacrum is noted, likely a benign hemangioma.  No other focal osseous lesion.  The conus medullaris terminates normally at the L1 level. Signal intensity within the visualized cord is within normal limits. Nerve roots of the cauda equina are unremarkable.  Paraspinous soft tissues are within normal limits.  No abnormal enhancement identified within the lumbar spine.  T10-11: Seen only on sagittal projection. No disc bulge or disc protrusion. No canal or foraminal stenosis.  T11-12: Seen only on sagittal projection. No disc bulge or disc protrusion. No canal or foraminal stenosis.  T12-L1: No disc bulge or disc protrusion. No significant facet arthropathy. No canal or foraminal stenosis.  L1-2: Mild disc desiccation without significant disc bulge or disc protrusion. No significant canal or foraminal stenosis. No significant facet arthrosis.  L2-3: Mild diffuse disc bulge with disc desiccation. No focal disc herniation. Thecal sac remains widely patent without significant canal stenosis. No significant foraminal narrowing.  L3-4: Degenerative intervertebral disc space narrowing with disc bulge and disc desiccation. There are associated degenerative endplate changes with chronic Schmorl's node present at the anterior aspect of the inferior endplate of L3. Degenerative endplate spurring present anteriorly. There is a superimposed right foraminal shallow disc protrusion, closely approximating/abutting the exiting right L3 nerve root as it courses out of the right neural foramen (series 7, image 22). There is an associated annular fissure. Mild bilateral facet arthrosis present with asymmetric fluid density present within the left L3-4 facet. Findings are likely related too scoliotic curvature of the lumbar spine, most prevalent at this level. The thecal sac remains widely patent without significant stenosis. There is moderate to severe right with mild left foraminal stenosis.  L4-5: Mild diffuse disc bulge with disc desiccation. No focal disc herniation. Moderate  right facet arthrosis. No significant canal narrowing. There is mild left with moderate right foraminal stenosis.  L5-S1: Mild disc desiccation without disc bulge or disc protrusion. No significant facet arthrosis. No canal or foraminal stenosis.  IMPRESSION: 1. No acute abnormality or abnormal enhancement within the lumbar spine. 2. Dextroscoliosis of the lumbar spine with apex at L2. 3. Degenerative disc disease with superimposed right foraminal disc protrusion at L3-4, abutting the exiting right L3 nerve root as it courses out of the right neural foramen. This could potentially result in nerve root irritation. There is moderate to severe right with mild left foraminal narrowing at this level. 4. Mild degenerative disc disease and facet arthrosis at L4-5 with resultant moderate right and mild left foraminal narrowing.   Electronically Signed   By: Jeannine Boga M.D.   On: 02/27/2014 22:56   Dg Chest Port 1 View  03/01/2014   CLINICAL DATA:  ET tube placement.  Fever and infection.  EXAM: PORTABLE CHEST - 1 VIEW  COMPARISON:  03/01/2014  FINDINGS: Endotracheal tube terminates 5.3 cm above carina. Nasogastric terminates of bodies stomach with side port just below the gastroesophageal junction.  Mild cardiomegaly. No pleural effusion or pneumothorax. improved pulmonary interstitial prominence. No lobar consolidation. Extends beyond the inferior aspect of the film.  IMPRESSION: Improved to  resolved interstitial prominence. Favored to represent resolving pulmonary edema.  Appropriate position of endotracheal and nasogastric tubes.   Electronically Signed   By: Abigail Miyamoto M.D.   On: 03/01/2014 09:26   Dg Chest Port 1 View  03/01/2014   CLINICAL DATA:  Acute onset of fever. Seizures. Unresponsive. Initial encounter.  EXAM: PORTABLE CHEST - 1 VIEW  COMPARISON:  Chest radiograph performed 02/28/2014  FINDINGS: The lungs are hypoexpanded. Vascular congestion is noted, with increased interstitial markings,  concerning for mild interstitial edema. Alternatively, an atypical infectious process might have a similar appearance, new from the prior study. There is no evidence of pleural effusion or pneumothorax.  The cardiomediastinal silhouette is borderline enlarged. No acute osseous abnormalities are seen.  IMPRESSION: Lungs hypoexpanded. Vascular congestion and borderline cardiomegaly noted. Increased interstitial markings raise question for mild interstitial edema. Alternatively, an atypical infectious process might have a similar appearance, new from the prior study.   Electronically Signed   By: Garald Balding M.D.   On: 03/01/2014 05:50      Recent Lab Findings: Lab Results  Component Value Date   WBC 21.7* 03/01/2014   HGB 12.3* 03/01/2014   HCT 35.9* 03/01/2014   PLT 168 03/01/2014   GLUCOSE 408* 03/01/2014   CHOL 139 04/27/2009   TRIG 69.0 04/27/2009   HDL 45.10 04/27/2009   LDLCALC 80 04/27/2009   ALT 34 03/01/2014   AST 25 03/01/2014   NA 132* 03/01/2014   K 4.0 03/01/2014   CL 93* 03/01/2014   CREATININE 1.07 03/01/2014   BUN 19 03/01/2014   CO2 18* 03/01/2014   TSH 0.549 **Test methodology is 3rd generation TSH** 01/02/2009   HGBA1C 10.3* 02/27/2014   Patient's TEE was reviewed, he has fairly sessile vegetation on the mitral valve leaflet about 1.4 cm in the greatest dimension and 0.9 cm and the other dimension. There is moderate mitral regurgitation There is no evidence of annular abscess or fistula formation.   Assessment / Plan:      1) Patient with for gram positive cocci in chains mitral valve endocarditis , With evidence of cerebral emboli with bilateral infarcts small but currently unable to evaluate mental status The patient does not demonstrate significant congestive heart failure, evidence of valve dehiscence or perforation or fistula formation. At this point I would not recommend immediate surgical intervention. Patient needs aggressive antibiotic therapy and  critical care support and follow-up echocardiogram as we progress with therapy. Should the patient develop increasing regurgitation with congestive heart failure valve dehiscence of perforation or persistent fever and bacteremia with optimal medical treatment or vegetations increasing in size in spite of antimicrobial therapy would then consider mitral valve repair or replacement.  I discussed the patient's case in detail with his wife and her parents.  2) poorly controlled diabetes mellitis   Grace Isaac MD      [address] [place] [address] Office [phone]   Beeper [phone]  03/01/2014 6:41 PM

## 2014-03-01 NOTE — Progress Notes (Signed)
Echocardiogram 2D Echocardiogram and Echocardiogram Transesophageal has been performed.  Joelene Millin 03/01/2014, 1:21 PM

## 2014-03-01 NOTE — Progress Notes (Signed)
rec'd return call re: critical labs at 0245. Reviewed with on call. New orders placed. Also discussed recent change in pt status. Noted some mild mental status changes. Reviewed VS, labs, assessments, etc. Asked for transfer to tele bed given tachycardia, drenching diaphoresis, and fever of UKO. On call to review and decide on POC. Petra Kuba, RN

## 2014-03-01 NOTE — Progress Notes (Signed)
[facility] transported to [name].  Wife at the bedside.

## 2014-03-01 NOTE — Consult Note (Signed)
PULMONARY / CRITICAL CARE MEDICINE   Name: [name] MRN: [id number] DOB: 08/26/1965    ADMISSION DATE:  02/26/2014 CONSULTATION DATE:  03/01/14  REFERRING MD :  Dr. Wynelle Cleveland   CHIEF COMPLAINT:  Sepsis, Fever, AMS  INITIAL PRESENTATION: 48 y/o M with PMH of HTN, HLD, DM, scoliosis and recent removal of multiple lipomas (~30) and poor wound healing who was admitted on 11/29 with c/o's intractable low back pain.  STUDIES:  11/30  MRI Lumbar Spine >> no acute lumbar abnormality, DJD of L3-4, L4-5 12/02  MRI Brain >> Multiple small areas of acute infarct bilaterally, chronic micro-hemorrhage in L external capsule.  Edema in L3-4 in disc space (likely degenerative but could be early infection)  SIGNIFICANT EVENTS: 11/29  Admit with back pain 12/02  Fever, seizures, decompensated requiring emergent intubation   HISTORY OF PRESENT ILLNESS: 48 y/o M with PMH of HTN, HLD, DM, scoliosis and recent removal (Sept 2015) of multiple lipomas (~30) and poor wound healing of sites who was admitted on 11/29 with c/o's intractable low back pain.  He reported 10/10 back pain on admission.  He was admitted for further evaluation.  MRI of the lumbar spine was obtained which was notable for no acute abnormalities but areas of DJD at L3-4, L4-5 with edema in the L3-4 disc space (?edema vs early infection).  He was treated with prednisone and narcotics for back pain.    His wife reports he has been on two antibiotics (since sept levaquin)for approximately one month as an outpatient.  She has noted her husband to have night sweats, intermittent fevers, chills and elevated blood sugars for approximately on month.  The biopsy under the left arm was not closing up and had been draining bloody fluid but no frank pus.  He apparently has recently traveled to [place] and [place] but symptoms preceded travel.  Biopsy sites were negative.    Overnight 12/2 developed altered mental status, fever to 105, chills, diaphoresis,  rise in WBC to 24.8 and a stat CT of the head was obtained which demonstrated multiple areas of acute infarct bilaterally.  During transport to CT, he was noted to have a seizure and was medicated with 4 mg of IV ativan and ultimately transferred to ICU.  The patient was noted to have rigors and decreased mental status.  He unfortunately declined with respiratory insufficiency and required emergent intubation.   PAST MEDICAL HISTORY :   has a past medical history of Diabetes mellitus without complication; Hyperlipidemia; Allergic rhinitis; lipoma; H/O scoliosis; and Hypertension.  has past surgical history that includes Lipoma excision; total nephrectomy (Left); Thyroid surgery; and Knee surgery.    Prior to Admission medications   Medication Sig Start Date End Date Taking? Authorizing Provider  acetaminophen (TYLENOL) 500 MG tablet Take 1,000 mg by mouth every 6 (six) hours as needed for moderate pain.   Yes Historical Provider, MD  albuterol (PROVENTIL HFA;VENTOLIN HFA) 108 (90 BASE) MCG/ACT inhaler Inhale 1 puff into the lungs every 6 (six) hours as needed for wheezing or shortness of breath.   Yes Historical Provider, MD  aspirin EC 81 MG tablet Take 81 mg by mouth daily.   Yes Historical Provider, MD  atorvastatin (LIPITOR) 10 MG tablet Take 10 mg by mouth daily.   Yes Historical Provider, MD  diclofenac sodium (VOLTAREN) 1 % GEL Apply 2 g topically 4 (four) times daily.   Yes Historical Provider, MD  hydrochlorothiazide (HYDRODIURIL) 25 MG tablet Take 25 mg by mouth daily.  Yes Historical Provider, MD  lisinopril (PRINIVIL,ZESTRIL) 20 MG tablet Take 20 mg by mouth daily.   Yes Historical Provider, MD  Magnesium Salicylate Tetrahyd (DOANS EXTRA STRENGTH) 580 MG TABS Take 2 tablets by mouth every 6 (six) hours as needed (pain).   Yes Historical Provider, MD  metFORMIN (GLUCOPHAGE) 500 MG tablet Take 500 mg by mouth 2 (two) times daily with a meal.   Yes Historical Provider, MD  mometasone  (ASMANEX) 220 MCG/INH inhaler Inhale 1 puff into the lungs daily.   Yes Historical Provider, MD  repaglinide (PRANDIN) 0.5 MG tablet Take 0.5 mg by mouth 2 (two) times daily before a meal.   Yes Historical Provider, MD  diazepam (VALIUM) 5 MG tablet Take 1 tablet (5 mg total) by mouth 2 (two) times daily. As needed for muscle spasms 02/26/14   Malvin Johns, MD  oxyCODONE-acetaminophen (PERCOCET) 5-325 MG per tablet Take 2 tablets by mouth every 4 (four) hours as needed. 02/26/14   Malvin Johns, MD   No Known Allergies  FAMILY HISTORY:  indicated that his sister is alive. He indicated that his paternal grandmother is deceased.    SOCIAL HISTORY:  reports that he has never smoked. He has never used smokeless tobacco. He reports that he drinks alcohol. He reports that he does not use illicit drugs.  REVIEW OF SYSTEMS:  Unable to complete as patient is altered on vent.   SUBJECTIVE:   VITAL SIGNS: Temp:  [98.2 F (36.8 C)-103.6 F (39.8 C)] 102.3 F (39.1 C) (12/02 0800) Pulse Rate:  [39-162] 145 (12/02 0834) Resp:  [13-37] 24 (12/02 0834) BP: (97-186)/(52-126) 138/84 mmHg (12/02 0833) SpO2:  [91 %-100 %] 99 % (12/02 0834) FiO2 (%):  [100 %] 100 % (12/02 0820) Weight:  [190 lb (86.183 kg)] 190 lb (86.183 kg) (12/02 0701)   HEMODYNAMICS:     VENTILATOR SETTINGS: Vent Mode:  [-]  FiO2 (%):  [100 %] 100 %   INTAKE / OUTPUT:  Intake/Output Summary (Last 24 hours) at 03/01/14 0845 Last data filed at 03/01/14 0630  Gross per 24 hour  Intake 3027.92 ml  Output   1100 ml  Net 1927.92 ml    PHYSICAL EXAMINATION: General:  wdwn adult male on vent   Neuro:  Obtunded, pupils 87mm =R, intermittent spontaneous movement of ext's post intubation  HEENT:  OETT, mm pink/moist, no jvd Cardiovascular:  s1s2 rrr, no m/r/g Lungs:  Even/non-labored post intubation, essentially clear, faint wheeze on L only Abdomen:  NTND, bsx4 active  Musculoskeletal:  No acute deformities  Skin:   Multiple lipomas, scars on upper extremities  LABS:  CBC  Recent Labs Lab 02/27/14 0437 02/28/14 0355 03/01/14 0509  WBC 9.8 12.6* 24.8*  HGB 13.0 13.0 12.1*  HCT 37.0* 36.9* 35.4*  PLT 170 185 186   Coag's No results for input(s): APTT, INR in the last 168 hours.   BMET  Recent Labs Lab 02/27/14 0437 02/28/14 0355 03/01/14 0509  NA 134* 134* 134*  K 4.1 4.4 3.3*  CL 96 96 95*  CO2 23 24 17*  BUN 16 16 18   CREATININE 0.84 0.91 0.99  GLUCOSE 288* 273* 319*   Electrolytes  Recent Labs Lab 02/27/14 0437 02/28/14 0355 03/01/14 0509  CALCIUM 8.9 9.1 8.7   Sepsis Markers  Recent Labs Lab 03/01/14 0509  LATICACIDVEN 2.3*   ABG  Recent Labs Lab 03/01/14 0402  PHART 7.420  PCO2ART 30.3*  PO2ART 81.9   Liver Enzymes  Recent Labs Lab 02/26/14  1329  AST 17  ALT 21  ALKPHOS 66  BILITOT 0.6  ALBUMIN 3.0*   Cardiac Enzymes No results for input(s): TROPONINI, PROBNP in the last 168 hours.   Glucose  Recent Labs Lab 02/28/14 0758 02/28/14 1224 02/28/14 1750 02/28/14 2104 03/01/14 0316 03/01/14 0349  GLUCAP 222* 216* 241* 236* 248* 273*    Imaging Dg Chest 2 View  02/28/2014   CLINICAL DATA:  Fever, history diabetes, hyperlipidemia, hypertension, scoliosis  EXAM: CHEST  2 VIEW  COMPARISON:  01/02/2009  FINDINGS: Lordotic AP positioning.  Normal heart size, mediastinal contours and pulmonary vascularity for technique.  Minimal chronic central peribronchial thickening and hyperinflation.  No definite infiltrate, pleural effusion or pneumothorax.  Bones demineralized.  IMPRESSION: No acute abnormalities.  Resolution of previously seen RIGHT upper lobe infiltrate.   Electronically Signed   By: Lavonia Dana M.D.   On: 02/28/2014 12:05     ASSESSMENT / PLAN:  PULMONARY OETT 12/2 >>  A: Acute Respiratory Failure - in setting of presumed bacteremia with encephalopathy, seizure + benzo's Asthma  P:   Full support, f/u ABG 30 minutes post  intubation  Follow up CXR Daily SBT / WUA  PRN Albuterol  Hold home Asmanex   CARDIOVASCULAR CVL  A:  Severe Sepsis - GPC's in chains on BC's, suspect recent skin biopsy as initial avenue for infection  R/O Infective Endocarditis HTN HLD P:  STAT TEE to r/o endocarditis  Hold lisinopril, lipitor PRN Hydralazine for SBP > 170  RENAL A:   Hx Wilms Tumor, Solitary Kidney P:   Monitor renal function  Avoid nephrotoxic agents   GASTROINTESTINAL A:   Vent Associated Dysphagia P:   SUP: PPI NPO OGT, begin TF   HEMATOLOGIC A:   Leukocytosis  Mild Anemia  P:  Monitor CBC See ID  INFECTIOUS A:   Bacteremia - GPC's in chains on BC's R/O Infective Endocarditis  Edema vs Abscess - L3-4 disc space, discussed with Rads, too early to determine if abscess Hx of International Travel - sx preceded travel dates.  P:   BCx2 12/1 >> GPC's in chains >>  Malaria Smear 12/1 >>  UC 12/2 >>  Sputum 12/2 >> RVP 12/2 >>  U. 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 Back Pain  Hx Scoliosis  P:   RASS goal: 0 EEG now Neurology following, appreciate input  Will need follow up MRI in 2-3 weeks to review L3-4 disc space for abscess Keppra IV PRN Ativan PRN robaxin for muscle spasm  If LP requested per ID, would recommend it to be done under fluoro for proper identification of disc space Fentanyl gtt for pain / sedation   FAMILY   - Updates: updated extensively at bedside   Noe Gens, NP-C [place] Pgr: [phone] or [phone]    03/01/2014, 8:45 AM   Attending:  I have  seen and examined the patient with nurse practitioner/resident and agree with the note above.   On my exam this morning he was clearly encephalopathic with tachypnea and sepsis based on vitals.  His respiratory status quickly declined shortly after my arrival so he was rapidly intubated.  Picture now looks like severe sepsis with bacteremia, worrisome for endocarditis.  I have discussed the plan with TRH, ID, neurology, and Cardiology at length today  Plan:  -full vent support -TEE today -continue antibiotics as per ID -f/u EEG results  Wife updated at length at bedside  My cc time 60 minutes  Roselie Awkward, MD [facility] Pager: [phone] Cell: [phone] If no response, call [phone]

## 2014-03-01 NOTE — Progress Notes (Signed)
ANTIBIOTIC CONSULT NOTE - INITIAL  Pharmacy Consult for Vancomycin and Fortaz Indication: bacteremia  No Known Allergies  Patient Measurements: Height: 6' 0.5" (184.2 cm) Weight: 190 lb (86.183 kg) IBW/kg (Calculated) : 78.75   Vital Signs: Temp: 98.2 F (36.8 C) (12/02 0154) Temp Source: Oral (12/02 0154) BP: 123/78 mmHg (12/02 0210) Pulse Rate: 105 (12/02 0210) Intake/Output from previous day: 12/01 0701 - 12/02 0700 In: 1759.2 [P.O.:180; I.V.:1579.2] Out: 800 [Urine:800] Intake/Output from this shift: Total I/O In: 1759.2 [P.O.:180; I.V.:1579.2] Out: 300 [Urine:300]  Labs:  Recent Labs  02/26/14 1329 02/27/14 0437 02/28/14 0355  WBC 10.9* 9.8 12.6*  HGB 13.6 13.0 13.0  PLT 183 170 185  CREATININE 0.83 0.84 0.91   Estimated Creatinine Clearance: 113.1 mL/min (by C-G formula based on Cr of 0.91). No results for input(s): VANCOTROUGH, VANCOPEAK, VANCORANDOM, GENTTROUGH, GENTPEAK, GENTRANDOM, TOBRATROUGH, TOBRAPEAK, TOBRARND, AMIKACINPEAK, AMIKACINTROU, AMIKACIN in the last 72 hours.   Microbiology: Recent Results (from the past 720 hour(s))  Malaria smear     Status: None (Preliminary result)   Collection Time: 02/28/14  3:55 AM  Result Value Ref Range Status   Specimen Description BLOOD  Final   Special Requests Normal  Final   Malaria Prep   Final    Negative pending thick smear review Performed at [facility]    Report Status PENDING  Incomplete  Culture, blood (routine x 2)     Status: None (Preliminary result)   Collection Time: 02/28/14  9:10 AM  Result Value Ref Range Status   Specimen Description BLOOD LEFT ARM  Final   Special Requests BOTTLES DRAWN AEROBIC AND ANAEROBIC 5CC  Final   Culture  Setup Time   Final    02/28/2014 13:28 Performed at [facility]    Culture   Final    [place] IN CHAINS Note: Gram Stain Report Called to,Read Back By and Verified With: MONTRESSA @0204  03/01/14 SMIAS Performed at [facility]    Report Status PENDING  Incomplete    Medical History: Past Medical History  Diagnosis Date  . Diabetes mellitus without complication     type 2  . Hyperlipidemia   . Allergic rhinitis   . lipoma   . H/O scoliosis   . Hypertension     Diagnostic exercise tolerance test assessment:04/30/2010 : comments normal -no evidence os ischemia by ST analysis    Medications:  Anti-infectives    Start     Dose/Rate Route Frequency Ordered Stop   03/01/14 0500  vancomycin (VANCOCIN) IVPB 1000 mg/200 mL premix     1,000 mg200 mL/hr over 60 Minutes Intravenous Every 8 hours 03/01/14 0301     03/01/14 0400  cefTAZidime (FORTAZ) 2 g in dextrose 5 % 50 mL IVPB     2 g100 mL/hr over 30 Minutes Intravenous 3 times per day 03/01/14 0301       Assessment: 48yo M admitted with fever. Recent international travel. ID service is following. Blood culture growing GPC so pharmacy is asked to dose Vancomycin and Fortaz.   12/2 >> Tressie Ellis >> 12/2 >> Vanc >>    Tmax: 100.3 WBCs: elevated Renal: SCr wnl, CrCl >100.  12/1 blood x2: GPC  Goal of Therapy:  Vancomycin trough level 15-20 mcg/ml Appropriate antibiotic dosing for renal function; eradication of infection  Plan:  Vancomycin 1g IV q8h. Fortaz 2g IV q8h. Measure Vanc trough at steady state. Follow up renal fxn and culture results.  Romeo Rabon, PharmD, pager [phone].  03/01/2014,3:03 AM.

## 2014-03-01 NOTE — Progress Notes (Addendum)
Shift events:   Brief HPI: [name] was admitted by [facility] on 02/26/14 after having a fever of 105.8 and c/o intractable back pain. Recent travel out of [place] so tests being run for that. Recent excision of cysts/lipomas with postop ? infection and was placed on 7days of Levaquin. However, he continued to have fever and chills at home and came to ED. ID has consulted and ordered various tests. No IV antibiotics were started given there was no focal etiology of infection.  Tonight, RN paged me earlier because pt "didn't feel well" and was diaphoretic, slightly tachycardic with normal O2 saturation and BP. Afebrile. Alert and oriented. He never desaturated and was not in respiratory distress. CBG was in the 200s at that time.  After which, blood cx returned positive for gram positive cocci in chains and given recent hx of ? skin infection, Fortaz and Vancomycin were ordered empirically. Then, RN paged this NP stating rapid response RN was called because pt had acute change in mental status and now had Temp of 103.6R. Pt was incontinent. NP to bedside.  S: pt can not participate in ROS secondary to mental status. RN states she did not see any seizure activity upon entering room and finding pt this was. + diaphoretic, not talking, MOE x 4.   O: BP normal. T 103.6R. HR 118. RR 14 with periods of apnea. No increased WOB. O2 sat remains normal on RA. Pt is awake but not responsive verbally. He moves extremities to pain. Pupils round, reactive and 57mm. His head is turned to the left with an upward left gaze. RRR. No respiratory distress but notable short apneic periods every 12 or so respirations. Skin is pale, clammy.  A/P: 1. New onset ? Seizure (not witnessed) with change in mental status. ? Etiology-sepsis, temp? Believe pt is post ictal given incontinence and left gaze. No seizure activity was observed. Stat CT head. Prolactin level. Foley due to critical illness. Wife denies hx of seizures. If no further seizures  tonight, will call neuro in am for consult. If pt worsens or if CT positive, will call after results or sooner. Will likely need full seizure workup. Defer to neuro.   2. Infection of ? etiology with continued fevers. Vanc and [place]. ID following. Wife denies neck pain or neck stiffness with pt at home. r/o meningitis? MRI of spine showed some disc issues but no abscess or diskitis. Urine culture, CBC with diff, BMP, Lactate, flu. Other test/labs pending that were ordered earlier on days. IVF.  NOTE: This NP called pt's wife at home and explained issues, status change, ? seizure activity and transfer to SDU. She will come to hospital this am. Pt is a FULL CODE.  Will follow. Clance Boll, NP [facility] Update: After pt returned from [place], he was slightly more alert but still non verbal. CT head was negative for acute issues. Upon assessment after CT, he still has left gaze preference and is now not moving the RLE fluently, even to painful stimuli. No spontaneous movement of RLE noted. Pupils are still reactive and do not appreciate a facial droop. Can not assess speech at this time.  Place order for MRI brain with and without contrast STAT (as soon as techs arrive this am). Called neuro given change in status. Awaiting return call. No further seizure activity. Change to ICU status.  KJKG, NP Update: ABG looked good. O2 placed given change in neuro status. CBC returned WBCC of 24, 800 up from 12,  600. Other labs still pending. Will update wife after other labs/tests back.  KJKG, NP Update: BMP still pending. CXR positive for intersitial edema vs. Atypical infection that wasn't present on last CXR. VS remain stable. Paged Neuro x 2 and no response as of yet.  KJKG, NP Further update: BMP shows acidosis. Pt continues in same neuro status. Still no call back from neuro. This NP called Elink box to request consult. Case discussed with Dr. Elsworth Soho who also camera'd in on pt in ICU. At present, he  agrees with treatment plan (including abx, testing ordered, and neuro consult) and states attending can call if further changes in pt or wishes for face to face consult. He will make oncoming PCCM doc aware of pt. In re: WBCC increase, the pt is noted to be on Prednisone as well, but given fevers, this is likely reflective of infection.  Calling wife now to further update. Sign out to attending verbally in am as well as written TRH communication.  KJKG, NP Further update: Spoke to wife at length about where we are now in diagnosing and pt's condition at present. Wife will be here this am. Made pt NPO and changed CBGs and SSI to q4hr. Holding po BP meds and Hydralazine IV prn ordered. Still have not heard from neuro. Sputum cx and mycobacterium ordered given CXR findings. Will defer further infection w/up to ID and Neuro.  KJKG, NP

## 2014-03-01 NOTE — Progress Notes (Signed)
   03/01/14 1400  Clinical Encounter Type  Visited With Patient and family together;Family  Visit Type Initial;Spiritual support;Pre-op;Critical Care  Referral From Nurse  Consult/Referral To Chaplain  Recommendations Continued support for spouse around surgery, support for 46 year old son.   Spiritual Encounters  Spiritual Needs Emotional;Prayer  Stress Factors  Patient Stress Factors Health changes  Family Stress Factors Major life changes;Loss of control    Chaplain responded to referral from nursing.  Pt's spouse Cyril Mourning) present at bedside.  Provided emotional and spiritual support around illness, surgery, tx to [facility].  Kristen voiced shock, fear.  Pt was raised catholic and Cyril Mourning requested prayers.  Stated that Merrilee Seashore "does everything to care for his family and he would pray for the strength to fight and be here to care for his family."   Cyril Mourning would like [facility] chaplain to contact [place] at [facility] for follow up support.    [place], [place]

## 2014-03-01 NOTE — Consult Note (Signed)
Reason for Consult:   Endocarditis  Requesting Physician: Mary Sella  HPI:  This is a 48 y.o. male with a past medical history significant for HTN, NIDDM, dyslipidemia, and a family history of CAD. He has been seen by our group in the past. His LOV was Jan 2015 with Dr Irish Lack. His lipids and B/P were controlled at that time. He has had a negative treadmill in 2011.            He was admitted to [facility] 02/26/14 with back pain and fever. He has travelled out of the country on business. An MRI of his brain showed multiple emboli. His mental status deteriorated today and he was intubated. TEE showed MV vegitation with possible abscess and moderate MR. His EF was 30-35% with global HK. We are asked to consult as he will probably require coronary angiogram prior to MV surgery.   PMHx:  Past Medical History  Diagnosis Date  . Diabetes mellitus without complication     type 2  . Hyperlipidemia   . Allergic rhinitis   . lipoma   . H/O scoliosis   . Hypertension     Diagnostic exercise tolerance test assessment:04/30/2010 : comments normal -no evidence os ischemia by ST analysis  . Solitary kidney   . Wilm's tumor (nephroblastoma)     Past Surgical History  Procedure Laterality Date  . Lipoma excision    . Total nephrectomy Left   . Thyroid surgery    . Knee surgery      SOCHx:  reports that he has never smoked. He has never used smokeless tobacco. He reports that he drinks alcohol. He reports that he does not use illicit drugs.  FAMHx: Family History  Problem Relation Age of Onset  . Heart disease Father     ALLERGIES: No Known Allergies  ROS: Review of systems not obtained due to patient factors.  Pt intubated and sedated  HOME MEDICATIONS: Prior to Admission medications   Medication Sig Start Date End Date Taking? Authorizing Provider  acetaminophen (TYLENOL) 500 MG tablet Take 1,000 mg by mouth every 6 (six) hours as needed for moderate pain.   Yes  Historical Provider, MD  albuterol (PROVENTIL HFA;VENTOLIN HFA) 108 (90 BASE) MCG/ACT inhaler Inhale 1 puff into the lungs every 6 (six) hours as needed for wheezing or shortness of breath.   Yes Historical Provider, MD  aspirin EC 81 MG tablet Take 81 mg by mouth daily.   Yes Historical Provider, MD  atorvastatin (LIPITOR) 10 MG tablet Take 10 mg by mouth daily.   Yes Historical Provider, MD  diclofenac sodium (VOLTAREN) 1 % GEL Apply 2 g topically 4 (four) times daily.   Yes Historical Provider, MD  hydrochlorothiazide (HYDRODIURIL) 25 MG tablet Take 25 mg by mouth daily.   Yes Historical Provider, MD  lisinopril (PRINIVIL,ZESTRIL) 20 MG tablet Take 20 mg by mouth daily.   Yes Historical Provider, MD  Magnesium Salicylate Tetrahyd (DOANS EXTRA STRENGTH) 580 MG TABS Take 2 tablets by mouth every 6 (six) hours as needed (pain).   Yes Historical Provider, MD  metFORMIN (GLUCOPHAGE) 500 MG tablet Take 500 mg by mouth 2 (two) times daily with a meal.   Yes Historical Provider, MD  mometasone (ASMANEX) 220 MCG/INH inhaler Inhale 1 puff into the lungs daily.   Yes Historical Provider, MD  repaglinide (PRANDIN) 0.5 MG tablet Take 0.5 mg by mouth 2 (two) times daily before a meal.  Yes Historical Provider, MD  diazepam (VALIUM) 5 MG tablet Take 1 tablet (5 mg total) by mouth 2 (two) times daily. As needed for muscle spasms 02/26/14   Malvin Johns, MD  oxyCODONE-acetaminophen (PERCOCET) 5-325 MG per tablet Take 2 tablets by mouth every 4 (four) hours as needed. 02/26/14   Malvin Johns, MD    HOSPITAL MEDICATIONS: I have reviewed the patient's current medications.  VITALS: Blood pressure 103/68, pulse 102, temperature 99.5 F (37.5 C), temperature source Rectal, resp. rate 16, height 6' (1.829 m), weight 192 lb 7.4 oz (87.3 kg), SpO2 99 %.  PHYSICAL EXAM: General appearance: sedated, intubated Neck: no carotid bruit and no JVD Lungs: clear to auscultation bilaterally Heart: regular rate and  rhythm. 2/6 systolic murmur at the apex. Abdomen: soft, non-tender; bowel sounds normal; no masses,  no organomegaly Extremities: extremities normal, atraumatic, no cyanosis or edema Pulses: 2+ and symmetric Skin: pale, cool, dry Neurologic: Sedated  LABS: Results for orders placed or performed during the hospital encounter of 02/26/14 (from the past 24 hour(s))  Glucose, capillary     Status: Abnormal   Collection Time: 02/28/14  5:50 PM  Result Value Ref Range   Glucose-Capillary 241 (H) 70 - 99 mg/dL  Glucose, capillary     Status: Abnormal   Collection Time: 02/28/14  9:04 PM  Result Value Ref Range   Glucose-Capillary 236 (H) 70 - 99 mg/dL  Glucose, capillary     Status: Abnormal   Collection Time: 03/01/14  3:16 AM  Result Value Ref Range   Glucose-Capillary 248 (H) 70 - 99 mg/dL  Glucose, capillary     Status: Abnormal   Collection Time: 03/01/14  3:49 AM  Result Value Ref Range   Glucose-Capillary 273 (H) 70 - 99 mg/dL  Blood gas, arterial     Status: Abnormal   Collection Time: 03/01/14  4:02 AM  Result Value Ref Range   FIO2 0.21 %   pH, Arterial 7.420 7.350 - 7.450   pCO2 arterial 30.3 (L) 35.0 - 45.0 mmHg   pO2, Arterial 81.9 80.0 - 100.0 mmHg   Bicarbonate 18.6 (L) 20.0 - 24.0 mEq/L   TCO2 16.5 0 - 100 mmol/L   Acid-base deficit 3.5 (H) 0.0 - 2.0 mmol/L   O2 Saturation 94.4 %   Patient temperature 103.6    Collection site RIGHT RADIAL    Drawn by [id number]    Sample type ARTERIAL DRAW    Allens test (pass/fail) PASS PASS  Basic metabolic panel     Status: Abnormal   Collection Time: 03/01/14  5:09 AM  Result Value Ref Range   Sodium 134 (L) 137 - 147 mEq/L   Potassium 3.3 (L) 3.7 - 5.3 mEq/L   Chloride 95 (L) 96 - 112 mEq/L   CO2 17 (L) 19 - 32 mEq/L   Glucose, Bld 319 (H) 70 - 99 mg/dL   BUN 18 6 - 23 mg/dL   Creatinine, Ser 0.99 0.50 - 1.35 mg/dL   Calcium 8.7 8.4 - 10.5 mg/dL   GFR calc non Af Amer >90 >90 mL/min   GFR calc Af Amer >90 >90 mL/min    Anion gap 22 (H) 5 - 15  Lactic acid, plasma     Status: Abnormal   Collection Time: 03/01/14  5:09 AM  Result Value Ref Range   Lactic Acid, Venous 2.3 (H) 0.5 - 2.2 mmol/L  Prolactin     Status: None   Collection Time: 03/01/14  5:09 AM  Result  Value Ref Range   Prolactin 15.4 2.1 - 17.1 ng/mL  CBC with Differential     Status: Abnormal   Collection Time: 03/01/14  5:09 AM  Result Value Ref Range   WBC 24.8 (H) 4.0 - 10.5 K/uL   RBC 3.93 (L) 4.22 - 5.81 MIL/uL   Hemoglobin 12.1 (L) 13.0 - 17.0 g/dL   HCT 35.4 (L) 39.0 - 52.0 %   MCV 90.1 78.0 - 100.0 fL   MCH 30.8 26.0 - 34.0 pg   MCHC 34.2 30.0 - 36.0 g/dL   RDW 12.7 11.5 - 15.5 %   Platelets 186 150 - 400 K/uL   Neutrophils Relative % 95 (H) 43 - 77 %   Lymphocytes Relative 1 (L) 12 - 46 %   Monocytes Relative 4 3 - 12 %   Eosinophils Relative 0 0 - 5 %   Basophils Relative 0 0 - 1 %   Neutro Abs 23.6 (H) 1.7 - 7.7 K/uL   Lymphs Abs 0.2 (L) 0.7 - 4.0 K/uL   Monocytes Absolute 1.0 0.1 - 1.0 K/uL   Eosinophils Absolute 0.0 0.0 - 0.7 K/uL   Basophils Absolute 0.0 0.0 - 0.1 K/uL   WBC Morphology MILD LEFT SHIFT (1-5% METAS, OCC MYELO, OCC BANDS)   MRSA PCR Screening     Status: None   Collection Time: 03/01/14  5:15 AM  Result Value Ref Range   MRSA by PCR NEGATIVE NEGATIVE  Glucose, capillary     Status: Abnormal   Collection Time: 03/01/14  8:10 AM  Result Value Ref Range   Glucose-Capillary 345 (H) 70 - 99 mg/dL   Comment 1 Documented in Chart    Comment 2 Notify RN   Lactic acid, plasma     Status: Abnormal   Collection Time: 03/01/14  9:10 AM  Result Value Ref Range   Lactic Acid, Venous 3.2 (H) 0.5 - 2.2 mmol/L  Comprehensive metabolic panel     Status: Abnormal   Collection Time: 03/01/14  9:10 AM  Result Value Ref Range   Sodium 132 (L) 137 - 147 mEq/L   Potassium 4.0 3.7 - 5.3 mEq/L   Chloride 93 (L) 96 - 112 mEq/L   CO2 18 (L) 19 - 32 mEq/L   Glucose, Bld 408 (H) 70 - 99 mg/dL   BUN 19 6 - 23 mg/dL    Creatinine, Ser 1.07 0.50 - 1.35 mg/dL   Calcium 8.7 8.4 - 10.5 mg/dL   Total Protein 6.7 6.0 - 8.3 g/dL   Albumin 2.4 (L) 3.5 - 5.2 g/dL   AST 25 0 - 37 U/L   ALT 34 0 - 53 U/L   Alkaline Phosphatase 93 39 - 117 U/L   Total Bilirubin 0.9 0.3 - 1.2 mg/dL   GFR calc non Af Amer 82 (L) >90 mL/min   GFR calc Af Amer >90 >90 mL/min   Anion gap 21 (H) 5 - 15  CBC with Differential     Status: Abnormal   Collection Time: 03/01/14  9:10 AM  Result Value Ref Range   WBC 21.7 (H) 4.0 - 10.5 K/uL   RBC 3.95 (L) 4.22 - 5.81 MIL/uL   Hemoglobin 12.3 (L) 13.0 - 17.0 g/dL   HCT 35.9 (L) 39.0 - 52.0 %   MCV 90.9 78.0 - 100.0 fL   MCH 31.1 26.0 - 34.0 pg   MCHC 34.3 30.0 - 36.0 g/dL   RDW 12.8 11.5 - 15.5 %   Platelets 168 150 -  400 K/uL   Neutrophils Relative % 97 (H) 43 - 77 %   Neutro Abs 21.1 (H) 1.7 - 7.7 K/uL   Lymphocytes Relative 1 (L) 12 - 46 %   Lymphs Abs 0.3 (L) 0.7 - 4.0 K/uL   Monocytes Relative 2 (L) 3 - 12 %   Monocytes Absolute 0.4 0.1 - 1.0 K/uL   Eosinophils Relative 0 0 - 5 %   Eosinophils Absolute 0.0 0.0 - 0.7 K/uL   Basophils Relative 0 0 - 1 %   Basophils Absolute 0.0 0.0 - 0.1 K/uL  Procalcitonin - Baseline     Status: None   Collection Time: 03/01/14  9:10 AM  Result Value Ref Range   Procalcitonin 45.27 ng/mL  Blood gas, arterial     Status: Abnormal   Collection Time: 03/01/14  9:19 AM  Result Value Ref Range   FIO2 1.00 %   Delivery systems VENTILATOR    Mode PRESSURE REGULATED VOLUME CONTROL    VT 620 mL   Rate 18 resp/min   Peep/cpap 5.0 cm H20   pH, Arterial 7.379 7.350 - 7.450   pCO2 arterial 33.4 (L) 35.0 - 45.0 mmHg   pO2, Arterial 501.0 (H) 80.0 - 100.0 mmHg   Bicarbonate 18.7 (L) 20.0 - 24.0 mEq/L   TCO2 16.3 0 - 100 mmol/L   Acid-base deficit 4.2 (H) 0.0 - 2.0 mmol/L   O2 Saturation 99.8 %   Patient temperature 102.9    Collection site RIGHT BRACHIAL    Drawn by [phone]    Sample type ARTERIAL DRAW   Glucose, capillary     Status: Abnormal    Collection Time: 03/01/14 10:26 AM  Result Value Ref Range   Glucose-Capillary 376 (H) 70 - 99 mg/dL   Comment 1 Documented in Chart   Strep pneumoniae urinary antigen     Status: None   Collection Time: 03/01/14 10:43 AM  Result Value Ref Range   Strep Pneumo Urinary Antigen NEGATIVE NEGATIVE  Glucose, capillary     Status: Abnormal   Collection Time: 03/01/14 12:00 PM  Result Value Ref Range   Glucose-Capillary 380 (H) 70 - 99 mg/dL  Glucose, capillary     Status: Abnormal   Collection Time: 03/01/14  4:43 PM  Result Value Ref Range   Glucose-Capillary 263 (H) 70 - 99 mg/dL   Comment 1 Capillary Sample    Comment 2 Notify RN      IMAGING: Dg Chest 2 View  02/28/2014   CLINICAL DATA:  Fever, history diabetes, hyperlipidemia, hypertension, scoliosis  EXAM: CHEST  2 VIEW  COMPARISON:  01/02/2009  FINDINGS: Lordotic AP positioning.  Normal heart size, mediastinal contours and pulmonary vascularity for technique.  Minimal chronic central peribronchial thickening and hyperinflation.  No definite infiltrate, pleural effusion or pneumothorax.  Bones demineralized.  IMPRESSION: No acute abnormalities.  Resolution of previously seen RIGHT upper lobe infiltrate.   Electronically Signed   By: Lavonia Dana M.D.   On: 02/28/2014 12:05   Ct Head Wo Contrast  03/01/2014   CLINICAL DATA:  Became unresponsive less than 1 hour ago. Looking constantly to the left. Initial encounter.  EXAM: CT HEAD WITHOUT CONTRAST  TECHNIQUE: Contiguous axial images were obtained from the base of the skull through the vertex without intravenous contrast.  COMPARISON:  None.  FINDINGS: There is no evidence of acute infarction, mass lesion, or intra- or extra-axial hemorrhage on CT.  Mild periventricular white matter change likely reflects small vessel ischemic microangiopathy.  The posterior fossa, including the cerebellum, brainstem and fourth ventricle, is within normal limits. The third and lateral ventricles, and basal  ganglia are unremarkable in appearance. The cerebral hemispheres are symmetric in appearance, with normal gray-white differentiation. No mass effect or midline shift is seen.  There is no evidence of fracture; visualized osseous structures are unremarkable in appearance. The orbits are within normal limits. The paranasal sinuses and mastoid air cells are well-aerated. No significant soft tissue abnormalities are seen.  IMPRESSION: 1. No acute intracranial pathology seen on CT. 2. Mild small vessel ischemic microangiopathy.   Electronically Signed   By: Garald Balding M.D.   On: 03/01/2014 04:47   Mr [name] YP Contrast  03/01/2014   CLINICAL DATA:  Seizure. Right sided visual neglect. Transient alteration of awareness  EXAM: MRI HEAD WITHOUT AND WITH CONTRAST  TECHNIQUE: Multiplanar, multiecho pulse sequences of the brain and surrounding structures were obtained without and with intravenous contrast.  CONTRAST:  50mL MULTIHANCE GADOBENATE DIMEGLUMINE 529 MG/ML IV SOLN  COMPARISON:  CT head 03/01/2014  FINDINGS: The patient was sedated with Ativan however remained agitated and had difficulty holding still. There is progressive motion on the study and the patient was not able to complete the final postcontrast coronal sequence.  Multiple areas of acute infarct are noted. Small areas are restricted diffusion are present bilaterally including the right cerebellum, left occipital pole, left medial occipital lobe, left parietal lobe, right parietal periventricular white matter, head of caudate on the left. This pattern suggests multiple small emboli.  Ventricle size is normal. No cortical infarct. Cerebral volume is normal.  Microhemorrhage in the left external capsule. Question history of hypertension. No other areas of hemorrhage.  Postcontrast images degraded by motion however no enhancing lesions are identified.  Negative for mass or edema  IMPRESSION: Multiple small areas of acute infarct bilaterally, consistent  with cerebral emboli.  Chronic microhemorrhage in the left external capsule. Question hypertension   Electronically Signed   By: Franchot Gallo M.D.   On: 03/01/2014 08:07   Mr Lumbar Spine W Wo Contrast  02/27/2014   CLINICAL DATA:  Intractable back pain.  History of scoliosis.  EXAM: MRI LUMBAR SPINE WITHOUT AND WITH CONTRAST  TECHNIQUE: Multiplanar and multiecho pulse sequences of the lumbar spine were obtained without and with intravenous contrast.  CONTRAST:  31mL MULTIHANCE GADOBENATE DIMEGLUMINE 529 MG/ML IV SOLN  COMPARISON:  Prior radiograph from 02/26/2014.  FINDINGS: For the purposes of this dictation, the lowest well-formed intervertebral disc spaces presumed to be the L5-S1 level, and there presumed to be 5 lumbar type vertebral bodies.  Dextroscoliosis of the lumbar spine with apex at L2 is present. Vertebral bodies are otherwise normally aligned with preservation of the normal lumbar lordosis. Vertebral body heights are maintained. No acute fracture.  Lobular 2.1 cm T1 hypointense, T2/STIR hyperintense nonenhancing lesion within the central aspect of the sacrum is noted, likely a benign hemangioma. No other focal osseous lesion.  The conus medullaris terminates normally at the L1 level. Signal intensity within the visualized cord is within normal limits. Nerve roots of the cauda equina are unremarkable.  Paraspinous soft tissues are within normal limits.  No abnormal enhancement identified within the lumbar spine.  T10-11: Seen only on sagittal projection. No disc bulge or disc protrusion. No canal or foraminal stenosis.  T11-12: Seen only on sagittal projection. No disc bulge or disc protrusion. No canal or foraminal stenosis.  T12-L1: No disc bulge or disc protrusion. No significant facet  arthropathy. No canal or foraminal stenosis.  L1-2: Mild disc desiccation without significant disc bulge or disc protrusion. No significant canal or foraminal stenosis. No significant facet arthrosis.  L2-3:  Mild diffuse disc bulge with disc desiccation. No focal disc herniation. Thecal sac remains widely patent without significant canal stenosis. No significant foraminal narrowing.  L3-4: Degenerative intervertebral disc space narrowing with disc bulge and disc desiccation. There are associated degenerative endplate changes with chronic Schmorl's node present at the anterior aspect of the inferior endplate of L3. Degenerative endplate spurring present anteriorly. There is a superimposed right foraminal shallow disc protrusion, closely approximating/abutting the exiting right L3 nerve root as it courses out of the right neural foramen (series 7, image 22). There is an associated annular fissure. Mild bilateral facet arthrosis present with asymmetric fluid density present within the left L3-4 facet. Findings are likely related too scoliotic curvature of the lumbar spine, most prevalent at this level. The thecal sac remains widely patent without significant stenosis. There is moderate to severe right with mild left foraminal stenosis.  L4-5: Mild diffuse disc bulge with disc desiccation. No focal disc herniation. Moderate right facet arthrosis. No significant canal narrowing. There is mild left with moderate right foraminal stenosis.  L5-S1: Mild disc desiccation without disc bulge or disc protrusion. No significant facet arthrosis. No canal or foraminal stenosis.  IMPRESSION: 1. No acute abnormality or abnormal enhancement within the lumbar spine. 2. Dextroscoliosis of the lumbar spine with apex at L2. 3. Degenerative disc disease with superimposed right foraminal disc protrusion at L3-4, abutting the exiting right L3 nerve root as it courses out of the right neural foramen. This could potentially result in nerve root irritation. There is moderate to severe right with mild left foraminal narrowing at this level. 4. Mild degenerative disc disease and facet arthrosis at L4-5 with resultant moderate right and mild left  foraminal narrowing.   Electronically Signed   By: Jeannine Boga M.D.   On: 02/27/2014 22:56   Dg Chest Port 1 View  03/01/2014   CLINICAL DATA:  ET tube placement.  Fever and infection.  EXAM: PORTABLE CHEST - 1 VIEW  COMPARISON:  03/01/2014  FINDINGS: Endotracheal tube terminates 5.3 cm above carina. Nasogastric terminates of bodies stomach with side port just below the gastroesophageal junction.  Mild cardiomegaly. No pleural effusion or pneumothorax. improved pulmonary interstitial prominence. No lobar consolidation. Extends beyond the inferior aspect of the film.  IMPRESSION: Improved to resolved interstitial prominence. Favored to represent resolving pulmonary edema.  Appropriate position of endotracheal and nasogastric tubes.   Electronically Signed   By: Abigail Miyamoto M.D.   On: 03/01/2014 09:26   Dg Chest Port 1 View  03/01/2014   CLINICAL DATA:  Acute onset of fever. Seizures. Unresponsive. Initial encounter.  EXAM: PORTABLE CHEST - 1 VIEW  COMPARISON:  Chest radiograph performed 02/28/2014  FINDINGS: The lungs are hypoexpanded. Vascular congestion is noted, with increased interstitial markings, concerning for mild interstitial edema. Alternatively, an atypical infectious process might have a similar appearance, new from the prior study. There is no evidence of pleural effusion or pneumothorax.  The cardiomediastinal silhouette is borderline enlarged. No acute osseous abnormalities are seen.  IMPRESSION: Lungs hypoexpanded. Vascular congestion and borderline cardiomegaly noted. Increased interstitial markings raise question for mild interstitial edema. Alternatively, an atypical infectious process might have a similar appearance, new from the prior study.   Electronically Signed   By: Garald Balding M.D.   On: 03/01/2014 05:50    IMPRESSION:  Principal Problem:   Change in mental status Active Problems:   Gram-positive bacteremia   Severe sepsis   Acute endocarditis   CVA (cerebral  infarction)- embolic   HYPERTENSION, BENIGN   Intractable back pain   DM (diabetes mellitus), type 2, uncontrolled   Cardiomyopathy- EF 30-35%   Family history of ischemic heart disease   Hyperglycemia   Hyperlipidemia   RECOMMENDATION: Dr Martinique to see  Time Spent Directly with Patient: 55 minutes  Erlene Quan [id number] beeper 03/01/2014, 5:35 PM  Patient seen and examined and history reviewed. Agree with above findings and plan. 48 yo WM with above history presented initially with fever and back pain. He underwent resection of multiple lipomas in September. Had some degree of infection and was treated with antibiotics (levofloxacin). Recently returned after foreign travel and complained of malaise. Developed worsening back pain to the point of incapacity. Now with high fever, elevated WBC. MRI shows evidence of multiple small cerebral emboli. Both transthoracic and tranesophageal Echo show evidence of large MV vegetation with at least moderate MR. Multiple MR jets with reflux into pulmonary veins. Apparently had worsening mental status this am and was intubated. History provided by family. On exam he is intubated. Unresponsive. Lungs are clear. Soft systolic murmur at apex. No edema. No rashes.  Ecg is normal.   Impression: 1. Bacterial endocarditis. Cultures to date growing gram positive cocci in chains. ID is pending. High risk feature is size of vegetation. Multiple septic cerebral emboli. No evidence of abcess of spine based on MRI but could potentially have been seeded. Currently hemodynamically stable. On IV antibiotics per ID pending identification of bacterium. Anticipate he will require surgery but timing will depend on clinical course and neurologic status. If he develops acute valve deterioration or recurrent emboli this would indicate more urgent need for surgery. I anticipate he will need right and left heart cath prior to cardiac surgery but will await CT surgery evaluation.  Ideally would like to see improvement in neuro status, fever, and WBC with antibiotic therapy. 2. Septic cerebral emboli 3. 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 S/p resection of multiple lipomas 5. DM type 2 poorly controlled with infection 6. HTN 7. S/p nephrectomy for Wilm's tumor.  Will follow closely with you.   Asley Baskerville [place], [place] 03/01/2014 5:55 PM

## 2014-03-01 NOTE — Progress Notes (Signed)
Critical Lab called at 0204. Verified results with Agricultural consultant. Checked on pt and took VS. Notified on call physician at [phone]. Awaiting call back to discuss POC. Will cont to monitor pt status.  Petra Kuba

## 2014-03-01 NOTE — Progress Notes (Signed)
RR RN called to pt bedside to evaluate pt status at approx 0330. RR RN and Care RN noted changes from baseline. On call paged re: change in pt status. NP at bedside to eval at 0350.  At 0400 plan to transfer to SDU.  NP entering orders now and notifying family of change in status. RR RN taking over care of pt at this time. Care RN to accompany pt on transfer and give more detailed bedside report to receiving RN on 2W. Petra Kuba RN

## 2014-03-01 NOTE — Progress Notes (Signed)
Chaplain visited with wife at bedside. Chaplain provided emotional support and informed her of availability.  Page if needed.   Delford Field, Chaplain 03/01/2014

## 2014-03-01 NOTE — Progress Notes (Signed)
Echocardiogram Echocardiogram Transesophageal has been performed.  Joelene Millin 03/01/2014, 11:57 AM

## 2014-03-01 NOTE — Progress Notes (Signed)
Did not initiate tube feeding due to pending transfer by [facility] to [facility], for cardiac surgery.  Handoff report called to Roselyn Reef, Therapist, sports. Room number 2S060C-01.  Patient's wife [name], is at the bedside.

## 2014-03-01 NOTE — Procedures (Signed)
History: 48 yo M with multiple embolic strokes, concern for seizures.   Sedation: Multiple doses of benzodiazepine shortly before EEG  Technique: This is a 17 channel routine scalp EEG performed at the bedside with bipolar and monopolar montages arranged in accordance to the international 10/20 system of electrode placement. One channel was dedicated to EKG recording.    Background: This recording is perforemd during sleep as evidenced by gneralized irregular delta activity as well as sleep spindles. At times, there is a suggestion of decreased amplitdue in the left temporal region, but this is not consistent and therefore I suspect not of significance. Just before the end of the recording, there does appear to be a posterior rhythm of approximately 9 Hz that is poorly sustained and there continues to be significant slow activity.    Photic stimulation: Physiologic driving is not performed  EEG Abnormalities: 1) generalized slow activity.   Clinical Interpretation: This EEG is most consistent with a generalized non-specific cerebral dysfunction(encephalopathy). There was no seizure or seizure predisposition recorded on this study.   Roland Rack, MD [facility] [phone]  If 7pm- 7am, please page neurology on call as listed in [place].

## 2014-03-01 NOTE — Procedures (Signed)
Intubation Procedure Note [name] [id number] December 09, 1965  Procedure: Intubation Indications: Respiratory insufficiency  Procedure Details Consent: Unable to obtain consent because of emergent medical necessity. Time Out: Verified patient identification, verified procedure, site/side was marked, verified correct patient position, special equipment/implants available, medications/allergies/relevent history reviewed, required imaging and test results available.  Performed  Drugs Etomidate 20mg , Versed 2mg  Fentanyl 63mcg DL x 1 with MAC 4 blade Grade 2 view 8.0 tube passed through cords under direct visualization Placement confirmed with bilateral breath sounds, positive EtCO2 change and smoke in tube   Evaluation Hemodynamic Status: BP stable throughout; O2 sats: stable throughout Patient's Current Condition: stable Complications: No apparent complications Patient did tolerate procedure well. Chest X-ray ordered to verify placement.  CXR: pending.   MCQUAID, DOUGLAS 03/01/2014

## 2014-03-01 NOTE — Progress Notes (Addendum)
[facility] for Infectious Disease    Date of Admission:  02/26/2014   Total days of antibiotics 1        Day 1 vanco        Day 1 ceftaz           ID: 48yo M with DM, HTN, HLD who presented with back pain but found to have hx of FUO. He initially was hemodynamically stable, but last night decompensated with recurrent fevers, tachycardia, altered mental status, respiratory distress requiring intubation this morning. Blood cx + at 12-18hr with GPC in chains.  Principal Problem:   Intractable back pain Active Problems:   HYPERTENSION, BENIGN   Hyperglycemia   DM (diabetes mellitus), type 2, uncontrolled   Hyperlipidemia   Acute respiratory failure with hypoxia   Gram-positive bacteremia    Subjective:  intubated this morning. Tmax 103 last night. He initially was hemodynamically stable during the day, but last night decompensated with recurrent fevers, rigors tachycardia, altered mental status with possible seizure activity, respiratory distress requiring intubation this morning. Blood cx + at 12-18hr with GPC in chains. He had urgent MRI which revealed multiple small areas of acute infarct bilaterally c/w cerebral emboli. He was started initially on vancomycin and ceftaz, now changed to vanco/ctx/dexamethasone  Medications:  . cefTRIAXone (ROCEPHIN)  IV  2 g Intravenous Q12H  . dexamethasone  12 mg Intravenous 4 times per day  . docusate  50 mg Oral Daily  . enoxaparin (LOVENOX) injection  40 mg Subcutaneous Q24H  . feeding supplement (VITAL HIGH PROTEIN)  1,000 mL Per Tube Q24H  . fluticasone  1 puff Inhalation BID  . Influenza vac split quadrivalent PF  0.5 mL Intramuscular Tomorrow-1000  . insulin aspart  0-20 Units Subcutaneous Q4H  . insulin glargine  15 Units Subcutaneous QHS  . insulin starter kit- pen needles  1 kit Other Once  . levETIRAcetam  1,000 mg Intravenous STAT  . lidocaine (cardiac) 100 mg/90m      . pantoprazole sodium  40 mg Per Tube Q1200  .  pneumococcal 23 valent vaccine  0.5 mL Intramuscular Tomorrow-1000  . rocuronium      . succinylcholine      . vancomycin  1,000 mg Intravenous Q8H    Objective: Vital signs in last 24 hours: Temp:  [98.2 F (36.8 C)-103.6 F (39.8 C)] 102.3 F (39.1 C) (12/02 0800) Pulse Rate:  [39-162] 130 (12/02 0945) Resp:  [13-37] 21 (12/02 0945) BP: (97-186)/(52-126) 100/62 mmHg (12/02 0945) SpO2:  [91 %-100 %] 99 % (12/02 0945) FiO2 (%):  [40 %-100 %] 40 % (12/02 0938) Weight:  [190 lb (86.183 kg)] 190 lb (86.183 kg) (12/02 0701)  Physical Exam  Constitutional: intubated, sedated. No distress. Diaphoretic. HENT: no scleral icterus Mouth/Throat: ETT in place Cardiovascular: tachycardic, regular rhythm and normal heart sounds. Exam reveals no gallop and no friction rub.  No murmur heard.  Pulmonary/Chest: Effort normal and breath sounds normal. No respiratory distress. He has no wheezes.  Abdominal: Soft. Decreased bowel sounds. He exhibits no distension. There is no tenderness.  Skin: Skin is warm and moist from diaphoresis. Splinter hemorrhage  3 fingers on the left hand, and 4th finger on the right. No other rash. Healed lipoma incisions on arms, legs   Lab Results  Recent Labs  02/28/14 0355 03/01/14 0509 03/01/14 0910  WBC 12.6* 24.8* 21.7*  HGB 13.0 12.1* 12.3*  HCT 36.9* 35.4* 35.9*  NA 134* 134*  --   K  4.4 3.3*  --   CL 96 95*  --   CO2 24 17*  --   BUN 16 18  --   CREATININE 0.91 0.99  --    Liver Panel  Recent Labs  02/26/14 1329  PROT 7.5  ALBUMIN 3.0*  AST 17  ALT 21  ALKPHOS 66  BILITOT 0.6   Sedimentation Rate  Recent Labs  02/28/14 1734  ESRSEDRATE 56*   C-Reactive Protein  Recent Labs  02/28/14 1734  CRP 20.2*    Microbiology: 12/1 blood cx x 2 GPC in chains (called micro, too early to identify)  Studies/Results: Dg Chest 2 View  02/28/2014   CLINICAL DATA:  Fever, history diabetes, hyperlipidemia, hypertension, scoliosis  EXAM:  CHEST  2 VIEW  COMPARISON:  01/02/2009  FINDINGS: Lordotic AP positioning.  Normal heart size, mediastinal contours and pulmonary vascularity for technique.  Minimal chronic central peribronchial thickening and hyperinflation.  No definite infiltrate, pleural effusion or pneumothorax.  Bones demineralized.  IMPRESSION: No acute abnormalities.  Resolution of previously seen RIGHT upper lobe infiltrate.   Electronically Signed   By: Lavonia Dana M.D.   On: 02/28/2014 12:05   Ct Head Wo Contrast  03/01/2014   CLINICAL DATA:  Became unresponsive less than 1 hour ago. Looking constantly to the left. Initial encounter.  EXAM: CT HEAD WITHOUT CONTRAST  TECHNIQUE: Contiguous axial images were obtained from the base of the skull through the vertex without intravenous contrast.  COMPARISON:  None.  FINDINGS: There is no evidence of acute infarction, mass lesion, or intra- or extra-axial hemorrhage on CT.  Mild periventricular white matter change likely reflects small vessel ischemic microangiopathy.  The posterior fossa, including the cerebellum, brainstem and fourth ventricle, is within normal limits. The third and lateral ventricles, and basal ganglia are unremarkable in appearance. The cerebral hemispheres are symmetric in appearance, with normal gray-white differentiation. No mass effect or midline shift is seen.  There is no evidence of fracture; visualized osseous structures are unremarkable in appearance. The orbits are within normal limits. The paranasal sinuses and mastoid air cells are well-aerated. No significant soft tissue abnormalities are seen.  IMPRESSION: 1. No acute intracranial pathology seen on CT. 2. Mild small vessel ischemic microangiopathy.   Electronically Signed   By: Garald Balding M.D.   On: 03/01/2014 04:47   Mr [name] YC Contrast  03/01/2014   CLINICAL DATA:  Seizure. Right sided visual neglect. Transient alteration of awareness  EXAM: MRI HEAD WITHOUT AND WITH CONTRAST  TECHNIQUE:  Multiplanar, multiecho pulse sequences of the brain and surrounding structures were obtained without and with intravenous contrast.  CONTRAST:  38m MULTIHANCE GADOBENATE DIMEGLUMINE 529 MG/ML IV SOLN  COMPARISON:  CT head 03/01/2014  FINDINGS: The patient was sedated with Ativan however remained agitated and had difficulty holding still. There is progressive motion on the study and the patient was not able to complete the final postcontrast coronal sequence.  Multiple areas of acute infarct are noted. Small areas are restricted diffusion are present bilaterally including the right cerebellum, left occipital pole, left medial occipital lobe, left parietal lobe, right parietal periventricular white matter, head of caudate on the left. This pattern suggests multiple small emboli.  Ventricle size is normal. No cortical infarct. Cerebral volume is normal.  Microhemorrhage in the left external capsule. Question history of hypertension. No other areas of hemorrhage.  Postcontrast images degraded by motion however no enhancing lesions are identified.  Negative for mass or edema  IMPRESSION: Multiple  small areas of acute infarct bilaterally, consistent with cerebral emboli.  Chronic microhemorrhage in the left external capsule. Question hypertension   Electronically Signed   By: Franchot Gallo M.D.   On: 03/01/2014 08:07   Mr Lumbar Spine W Wo Contrast  02/27/2014   CLINICAL DATA:  Intractable back pain.  History of scoliosis.  EXAM: MRI LUMBAR SPINE WITHOUT AND WITH CONTRAST  TECHNIQUE: Multiplanar and multiecho pulse sequences of the lumbar spine were obtained without and with intravenous contrast.  CONTRAST:  86m MULTIHANCE GADOBENATE DIMEGLUMINE 529 MG/ML IV SOLN  COMPARISON:  Prior radiograph from 02/26/2014.  FINDINGS: For the purposes of this dictation, the lowest well-formed intervertebral disc spaces presumed to be the L5-S1 level, and there presumed to be 5 lumbar type vertebral bodies.  Dextroscoliosis of  the lumbar spine with apex at L2 is present. Vertebral bodies are otherwise normally aligned with preservation of the normal lumbar lordosis. Vertebral body heights are maintained. No acute fracture.  Lobular 2.1 cm T1 hypointense, T2/STIR hyperintense nonenhancing lesion within the central aspect of the sacrum is noted, likely a benign hemangioma. No other focal osseous lesion.  The conus medullaris terminates normally at the L1 level. Signal intensity within the visualized cord is within normal limits. Nerve roots of the cauda equina are unremarkable.  Paraspinous soft tissues are within normal limits.  No abnormal enhancement identified within the lumbar spine.  T10-11: Seen only on sagittal projection. No disc bulge or disc protrusion. No canal or foraminal stenosis.  T11-12: Seen only on sagittal projection. No disc bulge or disc protrusion. No canal or foraminal stenosis.  T12-L1: No disc bulge or disc protrusion. No significant facet arthropathy. No canal or foraminal stenosis.  L1-2: Mild disc desiccation without significant disc bulge or disc protrusion. No significant canal or foraminal stenosis. No significant facet arthrosis.  L2-3: Mild diffuse disc bulge with disc desiccation. No focal disc herniation. Thecal sac remains widely patent without significant canal stenosis. No significant foraminal narrowing.  L3-4: Degenerative intervertebral disc space narrowing with disc bulge and disc desiccation. There are associated degenerative endplate changes with chronic Schmorl's node present at the anterior aspect of the inferior endplate of L3. Degenerative endplate spurring present anteriorly. There is a superimposed right foraminal shallow disc protrusion, closely approximating/abutting the exiting right L3 nerve root as it courses out of the right neural foramen (series 7, image 22). There is an associated annular fissure. Mild bilateral facet arthrosis present with asymmetric fluid density present within  the left L3-4 facet. Findings are likely related too scoliotic curvature of the lumbar spine, most prevalent at this level. The thecal sac remains widely patent without significant stenosis. There is moderate to severe right with mild left foraminal stenosis.  L4-5: Mild diffuse disc bulge with disc desiccation. No focal disc herniation. Moderate right facet arthrosis. No significant canal narrowing. There is mild left with moderate right foraminal stenosis.  L5-S1: Mild disc desiccation without disc bulge or disc protrusion. No significant facet arthrosis. No canal or foraminal stenosis.  IMPRESSION: 1. No acute abnormality or abnormal enhancement within the lumbar spine. 2. Dextroscoliosis of the lumbar spine with apex at L2. 3. Degenerative disc disease with superimposed right foraminal disc protrusion at L3-4, abutting the exiting right L3 nerve root as it courses out of the right neural foramen. This could potentially result in nerve root irritation. There is moderate to severe right with mild left foraminal narrowing at this level. 4. Mild degenerative disc disease and facet arthrosis at  L4-5 with resultant moderate right and mild left foraminal narrowing.   Electronically Signed   By: Jeannine Boga M.D.   On: 02/27/2014 22:56   Dg Chest Port 1 View  03/01/2014   CLINICAL DATA:  ET tube placement.  Fever and infection.  EXAM: PORTABLE CHEST - 1 VIEW  COMPARISON:  03/01/2014  FINDINGS: Endotracheal tube terminates 5.3 cm above carina. Nasogastric terminates of bodies stomach with side port just below the gastroesophageal junction.  Mild cardiomegaly. No pleural effusion or pneumothorax. improved pulmonary interstitial prominence. No lobar consolidation. Extends beyond the inferior aspect of the film.  IMPRESSION: Improved to resolved interstitial prominence. Favored to represent resolving pulmonary edema.  Appropriate position of endotracheal and nasogastric tubes.   Electronically Signed   By: Abigail Miyamoto M.D.   On: 03/01/2014 09:26   Dg Chest Port 1 View  03/01/2014   CLINICAL DATA:  Acute onset of fever. Seizures. Unresponsive. Initial encounter.  EXAM: PORTABLE CHEST - 1 VIEW  COMPARISON:  Chest radiograph performed 02/28/2014  FINDINGS: The lungs are hypoexpanded. Vascular congestion is noted, with increased interstitial markings, concerning for mild interstitial edema. Alternatively, an atypical infectious process might have a similar appearance, new from the prior study. There is no evidence of pleural effusion or pneumothorax.  The cardiomediastinal silhouette is borderline enlarged. No acute osseous abnormalities are seen.  IMPRESSION: Lungs hypoexpanded. Vascular congestion and borderline cardiomegaly noted. Increased interstitial markings raise question for mild interstitial edema. Alternatively, an atypical infectious process might have a similar appearance, new from the prior study.   Electronically Signed   By: Garald Balding M.D.   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   - recommend to get TEE - will repeat blood cx today - agree with antibiotics of vancomycin, ceftriaxone 2gm IV Q12 plus steroids for now until we have identification of the pathogen. Will likely have identification of GPC  by tomorrow am -check strep urinary antigen, possibly help with seeing if pneumococcal infection - can d/c respiratory precautions when RVP returns as negative. Important to note that patient did not have any respirator symptoms on admit  - can d/c contact precautions if blood cx returns as not being MRSA  ------------------ Addendum: TTE and TEE show evidence of  large echodensity measuring 1.4 x 2 cm attached to the posterior leaflet of the mitral valve. It affects predominantly the P2 scallop but extends partially  into P1 and P3 scallops. There is translucent area in the middle of the posterior leaflet suspicious of an abscess formation. No obvious perforation.  Agree with plan to have patient transferred to [facility] for evaluation by CT surgery for surgical management of native MV endocarditis   Mailey Landstrom, [facility] for [facility] Diseases Cell: [phone] Pager: [phone]  03/01/2014, 9:55 AM

## 2014-03-01 NOTE — Progress Notes (Signed)
Updated wife on results of TEE, need for surgical consultation and pending transfer to [facility].  CVTS consult called.     Noe Gens, NP-C [facility] Pgr: [phone] or [phone]

## 2014-03-01 NOTE — Consult Note (Signed)
NEURO HOSPITALIST CONSULT NOTE    Reason for Consult:possible seizure activity.   HPI:                                                                                                                                         Much of history obtained from chart as patient is intubated and sedated.    [name] is an 48 y.o. male admitted by [facility] on 02/26/14 after having a fever of 105.8 and c/o intractable back pain. Recent travel out of [place] so tests being run for that. Recent excision of cysts/lipomas with postop ? infection and was placed on 7days of Levaquin. However, he continued to have fever and chills at home and came to ED. ID has consulted and ordered various tests. No IV antibiotics were started given there was no focal etiology of infection. Over night he became tachycardic noted he was not feeling well, BC returned positive for gram positive cocci in chains and placed on ABX Temperature was noted to raise to 103.6.  He was then noted to have a left gaze preference.  Patient was brought to CT scan where he was noted to have a peroid of arms flexed and mild tremor--there was question of riggers versus seizure.  On the floor he had another episode of generalized tremulous activity and "teeth chattering" which again was possibly riggers versus seizure. During this time he was not alert and only moaning while moving from bed to bed. He was intubated due to airway compromise. MRI showed embolic shower.   He also has been complaining of headaches over the past few days.   Past Medical History  Diagnosis Date  . Diabetes mellitus without complication     type 2  . Hyperlipidemia   . Allergic rhinitis   . lipoma   . H/O scoliosis   . Hypertension     Diagnostic exercise tolerance test assessment:04/30/2010 : comments normal -no evidence os ischemia by ST analysis    Past Surgical History  Procedure Laterality Date  . Lipoma excision    . Total nephrectomy Left   .  Thyroid surgery    . Knee surgery      Family History  Problem Relation Age of Onset  . Heart disease Father      Social History:  reports that he has never smoked. He has never used smokeless tobacco. He reports that he drinks alcohol. He reports that he does not use illicit drugs.  No Known Allergies  MEDICATIONS:  Prior to Admission:  Prescriptions prior to admission  Medication Sig Dispense Refill Last Dose  . acetaminophen (TYLENOL) 500 MG tablet Take 1,000 mg by mouth every 6 (six) hours as needed for moderate pain.   02/26/2014 at Unknown time  . albuterol (PROVENTIL HFA;VENTOLIN HFA) 108 (90 BASE) MCG/ACT inhaler Inhale 1 puff into the lungs every 6 (six) hours as needed for wheezing or shortness of breath.   02/25/2014 at Unknown time  . aspirin EC 81 MG tablet Take 81 mg by mouth daily.   02/25/2014 at Unknown time  . atorvastatin (LIPITOR) 10 MG tablet Take 10 mg by mouth daily.   02/26/2014 at Unknown time  . diclofenac sodium (VOLTAREN) 1 % GEL Apply 2 g topically 4 (four) times daily.   02/26/2014 at Unknown time  . hydrochlorothiazide (HYDRODIURIL) 25 MG tablet Take 25 mg by mouth daily.   02/25/2014 at Unknown time  . lisinopril (PRINIVIL,ZESTRIL) 20 MG tablet Take 20 mg by mouth daily.   02/26/2014 at 0800  . Magnesium Salicylate Tetrahyd (DOANS EXTRA STRENGTH) 580 MG TABS Take 2 tablets by mouth every 6 (six) hours as needed (pain).   02/26/2014 at Unknown time  . metFORMIN (GLUCOPHAGE) 500 MG tablet Take 500 mg by mouth 2 (two) times daily with a meal.   02/26/2014 at Unknown time  . mometasone (ASMANEX) 220 MCG/INH inhaler Inhale 1 puff into the lungs daily.   02/26/2014 at Unknown time  . repaglinide (PRANDIN) 0.5 MG tablet Take 0.5 mg by mouth 2 (two) times daily before a meal.   02/26/2014 at 0830   Scheduled: . cefTAZidime (FORTAZ)  IV  2 g  Intravenous 3 times per day  . docusate  50 mg Oral Daily  . enoxaparin (LOVENOX) injection  40 mg Subcutaneous Q24H  . fentaNYL  50 mcg Intravenous Once  . fluticasone  1 puff Inhalation BID  . Influenza vac split quadrivalent PF  0.5 mL Intramuscular Tomorrow-1000  . insulin aspart  0-20 Units Subcutaneous Q4H  . insulin glargine  10 Units Subcutaneous QHS  . insulin starter kit- pen needles  1 kit Other Once  . levETIRAcetam  1,000 mg Intravenous STAT  . lidocaine (cardiac) 100 mg/64m      . methocarbamol (ROBAXIN)  IV  500 mg Intravenous 4 times per day  . pantoprazole sodium  40 mg Per Tube Q1200  . phenytoin (DILANTIN) IV  862 mg Intravenous Once  . pneumococcal 23 valent vaccine  0.5 mL Intramuscular Tomorrow-1000  . predniSONE  60 mg Oral Q breakfast  . rocuronium      . succinylcholine      . vancomycin  1,000 mg Intravenous Q8H   Continuous: . sodium chloride 125 mL/hr at 03/01/14 0326  . fentaNYL infusion INTRAVENOUS     PRN:[DISCONTINUED] acetaminophen **OR** acetaminophen, albuterol, alum & mag hydroxide-simeth, fentaNYL, hydrALAZINE, LORazepam, methocarbamol (ROBAXIN)  IV, [DISCONTINUED] ondansetron **OR** ondansetron (ZOFRAN) IV   ROS:  History obtained from unobtainable from patient due to mental status    Blood pressure 138/84, pulse 145, temperature 102.3 F (39.1 C), temperature source Rectal, resp. rate 24, height 6' 0.5" (1.842 m), weight 86.183 kg (190 lb), SpO2 99 %.  Physical Exam  Constitutional: He appears well-developed and well-nourished.  Psych:intubated and sedated Eyes: No scleral injection HENT: No OP obstrucion Head: Normocephalic.  Cardiovascular: Tachycardic and regular rhythm.  Respiratory: intubated breathing over vent  GI: Soft. No distension. There is no tenderness.  Skin: WDI  Neurologic Examination:                                                                                                        Neuro Exam: Mental Status: Withdraws all 4 ext to noxious stimuli.  Cranial Nerves: II: no blink to threat pupils equal and reactive.  III,IV, VI: ptosis not present, doll's response present. Initially dysconjugate, though with noxious stimulation, his eyes do become conjugate.  V,VII: corneals intact VIII:unable to assess IX,X: gag reflex present XI: unable to assess XII: unable to assess Motor: Flaccid throughout, quasi-purposeful movement in the left UE, withdraws all 4 extremities equally to noxious stimuli.  Sensory: no response to pain Deep Tendon Reflexes:  Right: Upper Extremity   Left: Upper extremity   biceps (C-5 to C-6) 2/4   biceps (C-5 to C-6) 2/4 tricep (C7) 2/4    triceps (C7) 2/4 Brachioradialis (C6) 2/4  Brachioradialis (C6) 2/4  Lower Extremity Lower Extremity  quadriceps (L-2 to L-4) 2/4   quadriceps (L-2 to L-4) 2/4 Achilles (S1) 1/4   Achilles (S1) 1/4  Plantars: Equivocal bilaterally Cerebellar: Unable to assess    Lab Results: Basic Metabolic Panel:  Recent Labs Lab 02/26/14 1329 02/27/14 0437 02/28/14 0355 03/01/14 0509  NA 134* 134* 134* 134*  K 3.7 4.1 4.4 3.3*  CL 96 96 96 95*  CO2 _0 17*  GLUCOSE 268* 288* 273* 319*  BUN _1 CREATININE 0.83 0.84 0.91 0.99  CALCIUM 9.0 8.9 9.1 8.7    Liver Function Tests:  Recent Labs Lab 02/26/14 1329  AST 17  ALT 21  ALKPHOS 66  BILITOT 0.6  PROT 7.5  ALBUMIN 3.0*   No results for input(s): LIPASE, AMYLASE in the last 168 hours. No results for input(s): AMMONIA in the last 168 hours.  CBC:  Recent Labs Lab 02/26/14 1329 02/27/14 0437 02/28/14 0355 03/01/14 0509  WBC 10.9* 9.8 12.6* 24.8*  NEUTROABS 9.3*  --   --  23.6*  HGB 13.6 13.0 13.0 12.1*  HCT 39.0 37.0* 36.9* 35.4*  MCV 89.2 91.1 88.5 90.1  PLT 183 170 185 186    Cardiac Enzymes: No results for  input(s): CKTOTAL, CKMB, CKMBINDEX, TROPONINI in the last 168 hours.  Lipid Panel: No results for input(s): CHOL, TRIG, HDL, CHOLHDL, VLDL, LDLCALC in the last 168 hours.  CBG:  Recent Labs Lab 02/28/14 1224 02/28/14 1750 02/28/14 2104 03/01/14 0316 03/01/14 0349  GLUCAP 216* 241* 236* 248* 273*    Microbiology: Results for orders placed  or performed during the hospital encounter of 02/26/14  Malaria smear     Status: None (Preliminary result)   Collection Time: 02/28/14  3:55 AM  Result Value Ref Range Status   Specimen Description BLOOD  Final   Special Requests Normal  Final   Malaria Prep   Final    Negative pending thick smear review Performed at [facility]    Report Status PENDING  Incomplete  Culture, blood (routine x 2)     Status: None (Preliminary result)   Collection Time: 02/28/14  9:10 AM  Result Value Ref Range Status   Specimen Description BLOOD LEFT HAND  Final   Special Requests BOTTLES DRAWN AEROBIC ONLY 5CC  Final   Culture  Setup Time   Final    02/28/2014 13:28 Performed at [facility]    Culture   Final    [place] Note: Gram Stain Report Called to,Read Back By and Verified With:  CAROL MERRITT _0  03/01/14 SMIAS Performed at [facility]    Report Status PENDING  Incomplete  Culture, blood (routine x 2)     Status: None (Preliminary result)   Collection Time: 02/28/14  9:10 AM  Result Value Ref Range Status   Specimen Description BLOOD LEFT ARM  Final   Special Requests BOTTLES DRAWN AEROBIC AND ANAEROBIC 5CC  Final   Culture  Setup Time   Final    02/28/2014 13:28 Performed at [facility]    Culture   Final    [place] Note: Gram Stain Report Called to,Read Back By and Verified With: MONTRESSA _1  03/01/14 SMIAS Performed at [facility]    Report Status PENDING  Incomplete  MRSA PCR Screening     Status: None   Collection Time: 03/01/14  5:15 AM   Result Value Ref Range Status   MRSA by PCR NEGATIVE NEGATIVE Final    Comment:        The GeneXpert MRSA Assay (FDA approved for NASAL specimens only), is one component of a comprehensive MRSA colonization surveillance program. It is not intended to diagnose MRSA infection nor to guide or monitor treatment for MRSA infections.     Coagulation Studies: No results for input(s): LABPROT, INR in the last 72 hours.  Imaging: Dg Chest 2 View  02/28/2014   CLINICAL DATA:  Fever, history diabetes, hyperlipidemia, hypertension, scoliosis  EXAM: CHEST  2 VIEW  COMPARISON:  01/02/2009  FINDINGS: Lordotic AP positioning.  Normal heart size, mediastinal contours and pulmonary vascularity for technique.  Minimal chronic central peribronchial thickening and hyperinflation.  No definite infiltrate, pleural effusion or pneumothorax.  Bones demineralized.  IMPRESSION: No acute abnormalities.  Resolution of previously seen RIGHT upper lobe infiltrate.   Electronically Signed   By: Lavonia Dana M.D.   On: 02/28/2014 12:05   Ct Head Wo Contrast  03/01/2014   CLINICAL DATA:  Became unresponsive less than 1 hour ago. Looking constantly to the left. Initial encounter.  EXAM: CT HEAD WITHOUT CONTRAST  TECHNIQUE: Contiguous axial images were obtained from the base of the skull through the vertex without intravenous contrast.  COMPARISON:  None.  FINDINGS: There is no evidence of acute infarction, mass lesion, or intra- or extra-axial hemorrhage on CT.  Mild periventricular white matter change likely reflects small vessel ischemic microangiopathy.  The posterior fossa, including the cerebellum, brainstem and fourth ventricle, is within normal limits. The third and lateral ventricles, and basal ganglia are  unremarkable in appearance. The cerebral hemispheres are symmetric in appearance, with normal gray-white differentiation. No mass effect or midline shift is seen.  There is no evidence of fracture; visualized osseous  structures are unremarkable in appearance. The orbits are within normal limits. The paranasal sinuses and mastoid air cells are well-aerated. No significant soft tissue abnormalities are seen.  IMPRESSION: 1. No acute intracranial pathology seen on CT. 2. Mild small vessel ischemic microangiopathy.   Electronically Signed   By: Garald Balding M.D.   On: 03/01/2014 04:47   Mr [name] PT Contrast  03/01/2014   CLINICAL DATA:  Seizure. Right sided visual neglect. Transient alteration of awareness  EXAM: MRI HEAD WITHOUT AND WITH CONTRAST  TECHNIQUE: Multiplanar, multiecho pulse sequences of the brain and surrounding structures were obtained without and with intravenous contrast.  CONTRAST:  77m MULTIHANCE GADOBENATE DIMEGLUMINE 529 MG/ML IV SOLN  COMPARISON:  CT head 03/01/2014  FINDINGS: The patient was sedated with Ativan however remained agitated and had difficulty holding still. There is progressive motion on the study and the patient was not able to complete the final postcontrast coronal sequence.  Multiple areas of acute infarct are noted. Small areas are restricted diffusion are present bilaterally including the right cerebellum, left occipital pole, left medial occipital lobe, left parietal lobe, right parietal periventricular white matter, head of caudate on the left. This pattern suggests multiple small emboli.  Ventricle size is normal. No cortical infarct. Cerebral volume is normal.  Microhemorrhage in the left external capsule. Question history of hypertension. No other areas of hemorrhage.  Postcontrast images degraded by motion however no enhancing lesions are identified.  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  Chronic microhemorrhage in the left external capsule. Question hypertension   Electronically Signed   By: CFranchot GalloM.D.   On: 03/01/2014 08:07   Mr Lumbar Spine W Wo Contrast  02/27/2014   CLINICAL  DATA:  Intractable back pain.  History of scoliosis.  EXAM: MRI LUMBAR SPINE WITHOUT AND WITH CONTRAST  TECHNIQUE: Multiplanar and multiecho pulse sequences of the lumbar spine were obtained without and with intravenous contrast.  CONTRAST:  113mMULTIHANCE GADOBENATE DIMEGLUMINE 529 MG/ML IV SOLN  COMPARISON:  Prior radiograph from 02/26/2014.  FINDINGS: For the purposes of this dictation, the lowest well-formed intervertebral disc spaces presumed to be the L5-S1 level, and there presumed to be 5 lumbar type vertebral bodies.  Dextroscoliosis of the lumbar spine with apex at L2 is present. Vertebral bodies are otherwise normally aligned with preservation of the normal lumbar lordosis. Vertebral body heights are maintained. No acute fracture.  Lobular 2.1 cm T1 hypointense, T2/STIR hyperintense nonenhancing lesion within the central aspect of the sacrum is noted, likely a benign hemangioma. No other focal osseous lesion.  The conus medullaris terminates normally at the L1 level. Signal intensity within the visualized cord is within normal limits. Nerve roots of the cauda equina are unremarkable.  Paraspinous soft tissues are within normal limits.  No abnormal enhancement identified within the lumbar spine.  T10-11: Seen only on sagittal projection. No disc bulge or disc protrusion. No canal or foraminal stenosis.  T11-12: Seen only on sagittal projection. No disc bulge or disc protrusion. No canal or foraminal stenosis.  T12-L1: No disc bulge or disc protrusion. No significant facet arthropathy. No canal or foraminal stenosis.  L1-2: Mild disc desiccation without significant disc bulge or disc protrusion. No significant canal or foraminal  stenosis. No significant facet arthrosis.  L2-3: Mild diffuse disc bulge with disc desiccation. No focal disc herniation. Thecal sac remains widely patent without significant canal stenosis. No significant foraminal narrowing.  L3-4: Degenerative intervertebral disc space narrowing  with disc bulge and disc desiccation. There are associated degenerative endplate changes with chronic Schmorl's node present at the anterior aspect of the inferior endplate of L3. Degenerative endplate spurring present anteriorly. There is a superimposed right foraminal shallow disc protrusion, closely approximating/abutting the exiting right L3 nerve root as it courses out of the right neural foramen (series 7, image 22). There is an associated annular fissure. Mild bilateral facet arthrosis present with asymmetric fluid density present within the left L3-4 facet. Findings are likely related too scoliotic curvature of the lumbar spine, most prevalent at this level. The thecal sac remains widely patent without significant stenosis. There is moderate to severe right with mild left foraminal stenosis.  L4-5: Mild diffuse disc bulge with disc desiccation. No focal disc herniation. Moderate right facet arthrosis. No significant canal narrowing. There is mild left with moderate right foraminal stenosis.  L5-S1: Mild disc desiccation without disc bulge or disc protrusion. No significant facet arthrosis. No canal or foraminal stenosis.  IMPRESSION: 1. No acute abnormality or abnormal enhancement within the lumbar spine. 2. Dextroscoliosis of the lumbar spine with apex at L2. 3. Degenerative disc disease with superimposed right foraminal disc protrusion at L3-4, abutting the exiting right L3 nerve root as it courses out of the right neural foramen. This could potentially result in nerve root irritation. There is moderate to severe right with mild left foraminal narrowing at this level. 4. Mild degenerative disc disease and facet arthrosis at L4-5 with resultant moderate right and mild left foraminal narrowing.   Electronically Signed   By: Jeannine Boga M.D.   On: 02/27/2014 22:56   Dg Chest Port 1 View  03/01/2014   CLINICAL DATA:  Acute onset of fever. Seizures. Unresponsive. Initial encounter.  EXAM: PORTABLE  CHEST - 1 VIEW  COMPARISON:  Chest radiograph performed 02/28/2014  FINDINGS: The lungs are hypoexpanded. Vascular congestion is noted, with increased interstitial markings, concerning for mild interstitial edema. Alternatively, an atypical infectious process might have a similar appearance, new from the prior study. There is no evidence of pleural effusion or pneumothorax.  The cardiomediastinal silhouette is borderline enlarged. No acute osseous abnormalities are seen.  IMPRESSION: Lungs hypoexpanded. Vascular congestion and borderline cardiomegaly noted. Increased interstitial markings raise question for mild interstitial edema. Alternatively, an atypical infectious process might have a similar appearance, new from the prior study.   Electronically Signed   By: Garald Balding M.D.   On: 03/01/2014 05:50       Assessment and plan per attending neurologist  Etta Quill PA-C [facility] [phone]  03/01/2014, 8:45 AM   Dx:  Bacteremia Seizures Possible meningitis  Assessment/Plan: 48 yo M with new onset seizure in the setting of bacteremia. I strongly suspect infective endocarditis. His recent headaches and new seizures are concerning to me for possible meningeal spread of the infection and he has been started on antibiotics with CNS penetration(vancomycin and ceftriaxone). Given the cultures are showing + cocci in chains, would continue dexamethasone for possible pneumococcal meningitis, but he has already received 2 days of steroids previously.   1) Empiric treatment for meningitis.  2) Keppra 1gm now, then 574m BID.  3) EEG pending, though suspicion for status epilepticus is low.   MRoland Rack MD [facility] [phone] If  7pm- 7am, please page neurology on call as listed in [place].

## 2014-03-01 NOTE — Progress Notes (Signed)
INITIAL NUTRITION ASSESSMENT  DOCUMENTATION CODES Per approved criteria  -Non-severe (moderate) malnutrition in the context of acute illness or injury  Pt meets criteria for moderate MALNUTRITION in the context of acute illness/injury as evidenced by 9.5% body weight loss in one month, PO intake < 75% for one month.   INTERVENTION: -Initiate Vital 1.5 @ 20 ml/hr via OGT and increase by 10 ml every 4 hours to goal rate of 65 ml/hr.  -Tube feeding regimen provides 2340 kcal (100% of needs), 117 grams of protein, and 1192 ml of H2O.  -Adult enteral protocol -RD to continue to follow  NUTRITION DIAGNOSIS: Inadequate oral intake related to inability to eat  as evidenced by NPO status.   Goal: TF to meet >/= 90% of their estimated nutrition needs    Monitor:  TF tolerance, total protein/energy intake, labs, weights  Reason for Assessment: TF Consult/Low Braden Score/New Ventilated patient  48 y.o. male  Admitting Dx: Intractable back pain  ASSESSMENT: 48 y/o M with PMH of HTN, HLD, DM, scoliosis and recent removal of multiple lipomas (~30) and poor wound healing who was admitted on 11/29 with c/o's intractable low back pain.  -Per discussion with pt's wife,  pt with 20 lb weight loss in past 1-1.5 months (9.5% body weight loss, severe for time frame) -Weight loss began when pt underwent lipoma removal and developed infection.  -Diet recall indicated pt consuming smaller meals, 2-3 times daily for past one month. Decreased appetite was attributed to medications side effects and loose stools -Pt with elevated CBGs; likely d/t infection, inflammatory response, and medications Patient is currently intubated on ventilator support MV: 15.5 L/min Temp (24hrs), Avg:100.6 F (38.1 C), Min:98.2 F (36.8 C), Max:103.6 F (39.8 C)    Height: Ht Readings from Last 1 Encounters:  02/27/14 6' 0.5" (1.842 m)    Weight: Wt Readings from Last 1 Encounters:  02/27/14 190 lb (86.183 kg)     Ideal Body Weight: 178 lb  % Ideal Body Weight: 107%  Wt Readings from Last 10 Encounters:  02/27/14 190 lb (86.183 kg)  03/01/14 190 lb (86.183 kg)  02/27/14 190 lb (86.183 kg)  04/05/13 215 lb (97.523 kg)  04/24/09 213 lb (96.616 kg)  08/31/08 215 lb (97.523 kg)    Usual Body Weight: 215 lb per previous medical record  % Usual Body Weight: 88%  BMI:  Body mass index is 25.4 kg/(m^2).  Estimated Nutritional Needs: Kcal: 2340 Protein: 105-120 gram Fluid: >/= 2300 ml daily  Skin: surgical incisions from lipoma removal  Diet Order: Diet NPO time specified  EDUCATION NEEDS: -No education needs identified at this time   Intake/Output Summary (Last 24 hours) at 03/01/14 1030 Last data filed at 03/01/14 0630  Gross per 24 hour  Intake 3027.92 ml  Output    900 ml  Net 2127.92 ml    Last BM: 11/29   Labs:   Recent Labs Lab 02/28/14 0355 03/01/14 0509 03/01/14 0910  NA 134* 134* 132*  K 4.4 3.3* 4.0  CL 96 95* 93*  CO2 24 17* 18*  BUN _0 CREATININE 0.91 0.99 1.07  CALCIUM 9.1 8.7 8.7  GLUCOSE 273* 319* 408*    CBG (last 3)   Recent Labs  02/28/14 2104 03/01/14 0316 03/01/14 0349  GLUCAP 236* 248* 273*    Scheduled Meds: . antiseptic oral rinse  7 mL Mouth Rinse QID  . cefTRIAXone (ROCEPHIN)  IV  2 g Intravenous Q12H  . chlorhexidine  15 mL Mouth Rinse BID  . dexamethasone  12 mg Intravenous 4 times per day  . docusate  50 mg Oral Daily  . enoxaparin (LOVENOX) injection  40 mg Subcutaneous Q24H  . feeding supplement (VITAL HIGH PROTEIN)  1,000 mL Per Tube Q24H  . fluticasone  1 puff Inhalation BID  . Influenza vac split quadrivalent PF  0.5 mL Intramuscular Tomorrow-1000  . insulin aspart  0-20 Units Subcutaneous Q4H  . insulin glargine  15 Units Subcutaneous QHS  . insulin starter kit- pen needles  1 kit Other Once  . lidocaine (cardiac) 100 mg/52m      . pantoprazole sodium  40 mg Per Tube Q1200  . pneumococcal 23 valent  vaccine  0.5 mL Intramuscular Tomorrow-1000  . rocuronium      . succinylcholine      . vancomycin  1,000 mg Intravenous Q8H    Continuous Infusions: . sodium chloride 125 mL/hr at 03/01/14 0326  . fentaNYL infusion INTRAVENOUS 50 mcg/hr (03/01/14 02217    Past Medical History  Diagnosis Date  . Diabetes mellitus without complication     type 2  . Hyperlipidemia   . Allergic rhinitis   . lipoma   . H/O scoliosis   . Hypertension     Diagnostic exercise tolerance test assessment:04/30/2010 : comments normal -no evidence os ischemia by ST analysis  . Solitary kidney   . Wilm's tumor (nephroblastoma)     Past Surgical History  Procedure Laterality Date  . Lipoma excision    . Total nephrectomy Left   . Thyroid surgery    . Knee surgery      SAtlee AbideMAmherstLDN Clinical Dietitian [phone]

## 2014-03-01 NOTE — Progress Notes (Signed)
Per patient's wife, he has been flat on his back for three days, due to severe back pain. Brought to ED on back board by EMS.

## 2014-03-01 NOTE — Progress Notes (Signed)
ANTIBIOTIC CONSULT NOTE - INITIAL  Pharmacy Consult for Ceftriaxone Indication: bacteremia, r/o meningitis  No Known Allergies  Patient Measurements: Height: 6' 0.5" (184.2 cm) Weight: 190 lb (86.183 kg) IBW/kg (Calculated) : 78.75  Vital Signs: Temp: 102.3 F (39.1 C) (12/02 0800) Temp Source: Rectal (12/02 0800) BP: 92/56 mmHg (12/02 1000) Pulse Rate: 126 (12/02 1000) Intake/Output from previous day: 12/01 0701 - 12/02 0700 In: 3027.9 [P.O.:180; I.V.:2497.9; IV Piggyback:350] Out: 1100 [Urine:1100] Intake/Output from this shift:    Labs:  Recent Labs  02/28/14 0355 03/01/14 0509 03/01/14 0910  WBC 12.6* 24.8* 21.7*  HGB 13.0 12.1* 12.3*  PLT 185 186 168  CREATININE 0.91 0.99 1.07   Estimated Creatinine Clearance: 96.1 mL/min (by C-G formula based on Cr of 1.07). No results for input(s): VANCOTROUGH, VANCOPEAK, VANCORANDOM, GENTTROUGH, GENTPEAK, GENTRANDOM, TOBRATROUGH, TOBRAPEAK, TOBRARND, AMIKACINPEAK, AMIKACINTROU, AMIKACIN in the last 72 hours.   Medical History: Past Medical History  Diagnosis Date  . Diabetes mellitus without complication     type 2  . Hyperlipidemia   . Allergic rhinitis   . lipoma   . H/O scoliosis   . Hypertension     Diagnostic exercise tolerance test assessment:04/30/2010 : comments normal -no evidence os ischemia by ST analysis  . Solitary kidney   . Wilm's tumor (nephroblastoma)     Assessment: 48 y.o. male with poorly controlled DM, HLD, hx of thyroid nodules, scoliosis, recent removal of multiple lipomas and poor wound healing, admitted late 11/29 for severe back pain but also found to have rigors, high fever of rectal temp of 105, recent history of night sweats and unintentional weight loss. He has recently traveled internationally. Blood culture growing GPC in chains, pharmacy consulted to Vancomycin and Fortaz early 12/2. ID following for bacteremia and also testing for infections contracted from international travel. This  morning, patient developed tremors - question of seizures versus riggers with continued high fever. Pharmacy consulted to switch Fortaz to Ceftriaxone for r/o meningitis.  12/2 >> Tressie Ellis x 1 12/2 >> Vanc >>  12/2 >> Ceftriaxone >>  Tmax: 103.6 WBCs: elevated (steroids started 11/30) Renal: SCr wnl, CrCl >100 ml/min  12/1 blood x 2: 2/2 GPC in chains  12/2 urine: sent 12/1 malaria smear: negative pending thick smear review, pending 12/2 resp virus panel: sent 12/2 AFB culture with smear: ordered 12/2 influenza panel: sent 12/2 MRSA PCR neg 12/1 Quant tb gold: IP 12/1 HIV antibody: nonreactive 12/1 CMV IgM and Antibody IgG: IP/IP  Today is day #1 Vanc 1g IV q8h and now switching Fortaz to Ceftriaxone for GPC bacteremia and r/o meningitis. Steroids excalated to dexamethasone 12mg  q6h for possible pneumococcal meningitis. Neurology also concerned for infective endocarditis. ID following.  Goal of Therapy:  Vancomycin trough level 15-20 mcg/ml  Doses adjusted per renal function Eradication of infection  Plan:  1.  Ceftriaxone 2g IV q12h. 2.  Will check vancomycin trough tomorrow. 3.  F/u culture results, ID work up and recommendations.  Hershal Coria 03/01/2014,10:09 AM

## 2014-03-02 ENCOUNTER — Inpatient Hospital Stay (HOSPITAL_COMMUNITY): Payer: BC Managed Care – PPO

## 2014-03-02 DIAGNOSIS — I669 Occlusion and stenosis of unspecified cerebral artery: Secondary | ICD-10-CM

## 2014-03-02 DIAGNOSIS — I429 Cardiomyopathy, unspecified: Secondary | ICD-10-CM

## 2014-03-02 DIAGNOSIS — I38 Endocarditis, valve unspecified: Secondary | ICD-10-CM

## 2014-03-02 DIAGNOSIS — I76 Septic arterial embolism: Secondary | ICD-10-CM

## 2014-03-02 DIAGNOSIS — I330 Acute and subacute infective endocarditis: Secondary | ICD-10-CM | POA: Diagnosis present

## 2014-03-02 LAB — QUANTIFERON TB GOLD ASSAY (BLOOD)
Interferon Gamma Release Assay: NEGATIVE
Mitogen value: 1.21 IU/mL
Quantiferon Nil Value: 0.03 IU/mL
TB ANTIGEN MINUS NIL VALUE: 0 [IU]/mL
TB Ag value: 0.03 IU/mL

## 2014-03-02 LAB — CBC
HEMATOCRIT: 33.6 % — AB (ref 39.0–52.0)
HEMOGLOBIN: 11.3 g/dL — AB (ref 13.0–17.0)
MCH: 30.5 pg (ref 26.0–34.0)
MCHC: 33.6 g/dL (ref 30.0–36.0)
MCV: 90.6 fL (ref 78.0–100.0)
Platelets: 175 10*3/uL (ref 150–400)
RBC: 3.71 MIL/uL — ABNORMAL LOW (ref 4.22–5.81)
RDW: 13.2 % (ref 11.5–15.5)
WBC: 21.4 10*3/uL — ABNORMAL HIGH (ref 4.0–10.5)

## 2014-03-02 LAB — GLUCOSE, CAPILLARY
GLUCOSE-CAPILLARY: 382 mg/dL — AB (ref 70–99)
Glucose-Capillary: 200 mg/dL — ABNORMAL HIGH (ref 70–99)
Glucose-Capillary: 216 mg/dL — ABNORMAL HIGH (ref 70–99)
Glucose-Capillary: 310 mg/dL — ABNORMAL HIGH (ref 70–99)
Glucose-Capillary: 318 mg/dL — ABNORMAL HIGH (ref 70–99)
Glucose-Capillary: 330 mg/dL — ABNORMAL HIGH (ref 70–99)
Glucose-Capillary: 334 mg/dL — ABNORMAL HIGH (ref 70–99)
Glucose-Capillary: 374 mg/dL — ABNORMAL HIGH (ref 70–99)

## 2014-03-02 LAB — PROTEIN ELECTROPHORESIS, SERUM
ALPHA-2-GLOBULIN: 11.5 % (ref 7.1–11.8)
Albumin ELP: 45.8 % — ABNORMAL LOW (ref 55.8–66.1)
Alpha-1-Globulin: 9.4 % — ABNORMAL HIGH (ref 2.9–4.9)
BETA GLOBULIN: 5.8 % (ref 4.7–7.2)
Beta 2: 7.6 % — ABNORMAL HIGH (ref 3.2–6.5)
GAMMA GLOBULIN: 19.9 % — AB (ref 11.1–18.8)
M-SPIKE, %: NOT DETECTED g/dL
Total Protein ELP: 6.2 g/dL (ref 6.0–8.3)

## 2014-03-02 LAB — URINE CULTURE
COLONY COUNT: NO GROWTH
CULTURE: NO GROWTH
Special Requests: NORMAL

## 2014-03-02 LAB — PROCALCITONIN: Procalcitonin: 37.18 ng/mL

## 2014-03-02 LAB — BASIC METABOLIC PANEL
Anion gap: 15 (ref 5–15)
BUN: 21 mg/dL (ref 6–23)
CHLORIDE: 101 meq/L (ref 96–112)
CO2: 22 mEq/L (ref 19–32)
CREATININE: 0.86 mg/dL (ref 0.50–1.35)
Calcium: 8.5 mg/dL (ref 8.4–10.5)
GFR calc Af Amer: >90 mL/min (ref >90)
GFR calc non Af Amer: >90 mL/min (ref >90)
GLUCOSE: 333 mg/dL — AB (ref 70–99)
Potassium: 4.4 mEq/L (ref 3.7–5.3)
Sodium: 138 mEq/L (ref 137–147)

## 2014-03-02 LAB — VANCOMYCIN, TROUGH: Vancomycin Tr: 14.2 ug/mL (ref 10.0–20.0)

## 2014-03-02 MED ORDER — HYDRALAZINE HCL 20 MG/ML IJ SOLN
5.0000 mg | Freq: Three times a day (TID) | INTRAMUSCULAR | Status: DC
  Administered 2014-03-02 (×2): 5 mg via INTRAVENOUS
  Filled 2014-03-02 (×2): qty 0.25
  Filled 2014-03-02 (×2): qty 1
  Filled 2014-03-02: qty 0.25

## 2014-03-02 MED ORDER — INSULIN GLARGINE 100 UNIT/ML ~~LOC~~ SOLN
28.0000 [IU] | Freq: Every day | SUBCUTANEOUS | Status: DC
  Administered 2014-03-02: 28 [IU] via SUBCUTANEOUS
  Filled 2014-03-02 (×2): qty 0.28

## 2014-03-02 MED ORDER — HYDRALAZINE HCL 20 MG/ML IJ SOLN
5.0000 mg | Freq: Four times a day (QID) | INTRAMUSCULAR | Status: DC
  Administered 2014-03-02: 5 mg via INTRAVENOUS
  Filled 2014-03-02: qty 1

## 2014-03-02 MED ORDER — INSULIN ASPART 100 UNIT/ML ~~LOC~~ SOLN
0.0000 [IU] | SUBCUTANEOUS | Status: DC
  Administered 2014-03-02: 15 [IU] via SUBCUTANEOUS
  Administered 2014-03-02: 7 [IU] via SUBCUTANEOUS
  Administered 2014-03-03 (×3): 4 [IU] via SUBCUTANEOUS

## 2014-03-02 MED ORDER — METOPROLOL TARTRATE 1 MG/ML IV SOLN
5.0000 mg | Freq: Four times a day (QID) | INTRAVENOUS | Status: DC
  Administered 2014-03-02 – 2014-03-03 (×4): 5 mg via INTRAVENOUS
  Filled 2014-03-02 (×8): qty 5

## 2014-03-02 MED ORDER — INSULIN GLARGINE 100 UNIT/ML ~~LOC~~ SOLN
15.0000 [IU] | Freq: Every day | SUBCUTANEOUS | Status: DC
  Filled 2014-03-02: qty 0.15

## 2014-03-02 MED ORDER — LORAZEPAM 2 MG/ML IJ SOLN: 2.0000 mg | INTRAMUSCULAR | Status: DC | PRN

## 2014-03-02 MED ORDER — VANCOMYCIN HCL 10 G IV SOLR
1250.0000 mg | Freq: Three times a day (TID) | INTRAVENOUS | Status: DC
  Administered 2014-03-02 – 2014-03-03 (×2): 1250 mg via INTRAVENOUS
  Filled 2014-03-02 (×4): qty 1250

## 2014-03-02 MED ORDER — FUROSEMIDE 10 MG/ML IJ SOLN
40.0000 mg | Freq: Once | INTRAMUSCULAR | Status: AC
  Administered 2014-03-02: 40 mg via INTRAVENOUS
  Filled 2014-03-02: qty 4

## 2014-03-02 MED ORDER — FENTANYL CITRATE 0.05 MG/ML IJ SOLN
12.5000 ug | INTRAMUSCULAR | Status: DC | PRN
  Administered 2014-03-02 (×2): 25 ug via INTRAVENOUS
  Administered 2014-03-03: 12.5 ug via INTRAVENOUS
  Administered 2014-03-03 – 2014-03-05 (×10): 25 ug via INTRAVENOUS
  Administered 2014-03-05: 12.5 ug via INTRAVENOUS
  Administered 2014-03-06 – 2014-03-08 (×4): 25 ug via INTRAVENOUS
  Filled 2014-03-02 (×17): qty 2

## 2014-03-02 MED ORDER — INSULIN ASPART 100 UNIT/ML ~~LOC~~ SOLN
0.0000 [IU] | SUBCUTANEOUS | Status: DC
  Administered 2014-03-02: 15 [IU] via SUBCUTANEOUS

## 2014-03-02 NOTE — Progress Notes (Addendum)
ANTIBIOTIC CONSULT NOTE  Pharmacy Consult for vancomycin, ceftriaxone  Indication: bacteremia, endocarditis, possible meningitis   No Known Allergies  Patient Measurements: Height: 6' (182.9 cm) Weight: 205 lb 11 oz (93.3 kg) IBW/kg (Calculated) : 77.6 Adjusted Body Weight:   Vital Signs: Temp: 98 F (36.7 C) (12/03 1148) Temp Source: Oral (12/03 1148) BP: 128/79 mmHg (12/03 1300) Pulse Rate: 100 (12/03 1300) Intake/Output from previous day: 12/02 0701 - 12/03 0700 In: 4160.3 [I.V.:3145; NG/GT:315.3; IV Piggyback:700] Out: 1385 [Urine:1385] Intake/Output from this shift: Total I/O In: 860 [P.O.:60; I.V.:410; NG/GT:140; IV Piggyback:250] Out: 500 [Urine:500]  Labs:  Recent Labs  03/01/14 0509 03/01/14 0910 03/02/14 0353  WBC 24.8* 21.7* 21.4*  HGB 12.1* 12.3* 11.3*  PLT 186 168 175  CREATININE 0.99 1.07 0.86   Estimated Creatinine Clearance: 127.4 mL/min (by C-G formula based on Cr of 0.86).  Recent Labs  03/02/14 1206  VANCOTROUGH 14.2      Anti-infectives    Start     Dose/Rate Route Frequency Ordered Stop   03/02/14 2000  vancomycin (VANCOCIN) 1,250 mg in sodium chloride 0.9 % 250 mL IVPB     1,250 mg166.7 mL/hr over 90 Minutes Intravenous Every 8 hours 03/02/14 1344     03/01/14 1000  cefTRIAXone (ROCEPHIN) 2 g in dextrose 5 % 50 mL IVPB - Premix     2 g100 mL/hr over 30 Minutes Intravenous Every 12 hours 03/01/14 0907     03/01/14 0500  vancomycin (VANCOCIN) IVPB 1000 mg/200 mL premix  Status:  Discontinued     1,000 mg200 mL/hr over 60 Minutes Intravenous Every 8 hours 03/01/14 0301 03/02/14 1344   03/01/14 0400  cefTAZidime (FORTAZ) 2 g in dextrose 5 % 50 mL IVPB  Status:  Discontinued     2 g100 mL/hr over 30 Minutes Intravenous 3 times per day 03/01/14 0301 03/01/14 1287      Assessment: 48 yo male with 2/2 enterococcus bacteremia, mitral valve endocarditis and septic cerebral emboli. Not a candidate for MV surgery. On day #2 vanc/ceftriaxone.  Initial vancomycin trough slightly subtherapeutic, will increase dose given severity of infection. Tmax coming down, White count 12 > 21.4 (also rec'd dexamethasone during this time), malaria smear negative  12/1 blood x 2: 2/2 enterococcus 12/2 urine: neg 12/1 malaria smear: neg 12/2 resp virus panel: IP 12/2 AFB culture with smear: IP 12/2 influenza panel: neg 12/2 MRSA PCR neg 12/1 Quant tb gold: IP 12/1 HIV antibody: nonreactive 12/1 CMV IgM and Antibody IgG: -/+  Goal of Therapy:  Vancomycin trough level 15-20 mcg/ml   Plan:  Increase vancomycin to 1250 mg IV q8h Ceftriaxone 2 g IV q12h Obtain VT at steady state Watch cultures, renal fx, clinical course   Hughes Better, PharmD, BCPS Clinical Pharmacist Pager: [phone] 03/02/2014 1:45 PM

## 2014-03-02 NOTE — Progress Notes (Signed)
Utilization Review Completed.  

## 2014-03-02 NOTE — Plan of Care (Signed)
Problem: Phase II Progression Outcomes Goal: Progress activity as tolerated unless otherwise ordered Outcome: Not Progressing Goal: Vital signs remain stable Outcome: Progressing

## 2014-03-02 NOTE — Progress Notes (Signed)
PULMONARY / CRITICAL CARE MEDICINE   Name: [name] MRN: [id number] DOB: September 28, 1965    ADMISSION DATE:  02/26/2014 CONSULTATION DATE:  03/01/14  REFERRING MD :  Dr. Wynelle Cleveland   CHIEF COMPLAINT:  Sepsis, Fever, AMS  INITIAL PRESENTATION: 48 y/o M with PMH of HTN, HLD, DM, scoliosis and recent removal of multiple lipomas (~30) and poor wound healing who was admitted on 11/29 with c/o's intractable low back pain.  SIGNIFICANT EVENTS/STUDIES:  11/29  Admit to [facility] service with back pain  11/30  MRI Lumbar Spine:  No acute abnormality or abnormal enhancement within the lumbar spine. Dextroscoliosis of the lumbar spine with apex at L2. Degenerative disc disease with superimposed right foraminal disc protrusion at L3-4, abutting the exiting right L3 nerve root as it courses out of the right neural foramen. This could potentially  result in nerve root irritation. There is moderate to severe right with mild left foraminal narrowing at this level. Mild degenerative disc disease and facet arthrosis at L4-5 with resultant moderate right and mild left foraminal narrowing 12/02 ID consult  12/02  MRI Brain:  Multiple small areas of acute infarct bilaterally, chronic micro-hemorrhage in L external capsule.   12/02  Very high fever, seizures, decompensated requiring emergent intubation 12/02 CT head: No acute intracranial pathology seen on CT. Mild small vessel ischemic microangiopathy 12/02 TTE: There is a large echodensity measuring 1.4 x 2 cm attached to the posterior leaflet of the mitral valve. Anterior lealet is mildly thickened at the tip. There are mutiple jets of mitral regurgitation with at least moderate regurgitation 12/02 TEE: A large echodensity measuring 1.4 x 2 cm is attached to the posterior leaflet of the mitral valve suspicious for endocarditis. It affects predominantly the P2 scallop but extends partially into P1 and P3 scallops. There is translucent area in the middle of the  posterior leaflet suspicious of an abscess formation. No obvious perforation. Anterior lealet is mildly thickened at the tip. There are multiple jets of mitral regurgitation with at least moderate regurgitation. There is systolic flow reversal in the right upper pulmonary vein (not left upper PV). 12/02 Neurology consult: empiric treatment for meningitis. Keppra. EEG ordered 1202 EEG: consistent with a generalized non-specific cerebral dysfunction(encephalopathy). There was no seizure or seizure predisposition recorded on this study 12/02 TCTS consult: Patient needs aggressive antibiotic therapy and critical care support and follow-up echocardiogram as we progress with therapy. Should the patient develop increasing regurgitation with congestive heart failure valve dehiscence of perforation or persistent fever and bacteremia with optimal medical treatment or vegetations increasing in size in spite of antimicrobial therapy would then consider mitral valve repair or replacement.   12/03 passed SBT. Able to F/C despite fentanyl infusion. Extubated and tolerating   INDWELLING DEVICES:: ETT 12/02 >> 12/03  MICRO DATA: 12/01 Malaria smear >> NEG 12/01 blood >> enterococcus 12/02 Urine >> NEG 12/02 RVP >>  12/02 blood >>   ANTIMICROBIALS (per ID):  Ceftriaxone 12/02 >>  Vanc 12/02 >>   SUBJECTIVE:  passed SBT. Able to F/C despite fentanyl infusion. Extubated and tolerating  VITAL SIGNS: Temp:  [97.5 F (36.4 C)-99.1 F (37.3 C)] 98 F (36.7 C) (12/03 1148) Pulse Rate:  [85-105] 100 (12/03 1300) Resp:  [10-21] 17 (12/03 1300) BP: (96-128)/(62-79) 128/79 mmHg (12/03 1300) SpO2:  [94 %-100 %] 96 % (12/03 1300) FiO2 (%):  [40 %] 40 % (12/03 0910) Weight:  [93.3 kg (205 lb 11 oz)] 93.3 kg (205 lb 11 oz) (12/03 0500)  HEMODYNAMICS:     VENTILATOR SETTINGS: Vent Mode:  [-] PSV;CPAP FiO2 (%):  [40 %] 40 % Set Rate:  [14 bmp] 14 bmp Vt Set:  [017 mL] 620 mL PEEP:  [5 cmH20] 5  cmH20 Pressure Support:  [5 cmH20] 5 cmH20 Plateau Pressure:  [11 cmH20-16 cmH20] 11 cmH20   INTAKE / OUTPUT:  Intake/Output Summary (Last 24 hours) at 03/02/14 1423 Last data filed at 03/02/14 1400  Gross per 24 hour  Intake 3920.33 ml  Output   1610 ml  Net 2310.33 ml    PHYSICAL EXAMINATION: General:  RASS -2, + F/C intermittently  Neuro:  No focal deficits  HEENT: WNL, PERRL Cardiovascular: Reg, + holosyst M Lungs:  Clear anteriorly Abdomen: soft, +BS  Ext: warm, no edema, no stigmata of BE Skin:  Multiple lipomas, scars on upper extremities  LABS: I have reviewed all of today's lab results. Relevant abnormalities are discussed in the A/P section  CXR: CM, mild IS prominence  ASSESSMENT / PLAN:  PULMONARY  A: Acute Respiratory Failure due to severe sepsis, high fever, ? seizure P:   Extubated this AM per my order Supp O2 to maintain SpO2 > 92% Monitor resp status closely in ICU  CARDIOVASCULAR A:  Mitral valve endocarditis Moderate mitral regurgitation LV dysfunction - EF 30-35% Sinus tachycardia H/O HTN, HLD P:  Monitor telemetry Afterload reduction as permitted by BP - low dose hydralazine initiated 12/03 Metoprolol per Cards Cards and TCTS following Will need LHC @ some point Will need MV replacement @ some point  RENAL A:   Hx Wilms Tumor, Solitary Kidney High risk of AKI P:   Monitor BMET intermittently Monitor I/Os Correct electrolytes as indicated Avoid nephrotoxins  GASTROINTESTINAL A:   Post extubation dysphagia P:   SUP: PPI NPO post extubation Assess for diet 12/04  HEMATOLOGIC A:   Mild Anemia without acute blood loss High risk of hemorrhagic transformation of CNS emboli P:  DVT px: SCDs Monitor CBC intermittently Transfuse per usual ICU guidelines Avoid anticoagulation  INFECTIOUS A:   Enterococcus bacteremia Mitral valve endocarditis Severe sepsis Doubt meningitis - definitely not the primary site of  infection P:   Micro and abx as above ID service overseeing antimicrobial therapy DC systemic steroids due to severe hyperglycemia  ENDOCRINE A:   DM 2 Steroid induced hyperglycemia P:   Cont Lantus SSI changed to moderate scale while NPO DC dexamethasone  NEUROLOGIC A:   Acute Encephalopathy  Multiple septic emboli to brain ? Seizure 12/02 - likely induced by very high fever Severe back pain - presenting symptom. Concern for early epidural or paraspinal abscess not seen on original MRI  Hx Scoliosis  P:   Minimize sedatives/opioids Anticonvulsants per Neuro    FAMILY  Wife updated in detail @ bedside   45 mins CCM time  Merton Border, MD ; [facility] service Mobile [phone].  After 5:30 PM or weekends, call [phone]

## 2014-03-02 NOTE — Progress Notes (Signed)
. Patient Profile: 48 y.o. male with a past medical history significant for HTN, NIDDM, dyslipidemia, and a family history of CAD. He was admitted to [facility] 02/26/14 with back pain, fever, AMS and required intubation (note: he has travelled out of the country on business). An MRI of his brain showed multiple emboli. TEE with MV vegetation with possible abscess and moderate MR. EF 30-35% with global HK.  Subjective: Extubated earlier today. Neurological status is slowing improving. He is slow to verbalize responses. He can state is name, birth month and day but cannot recall the year. He gets visibly frustrated and tearful when he cannot answer questions, but does not appear to be in any acute distress. His wife is by his beside.    Objective: Vital signs in last 24 hours: Temp:  [97.5 F (36.4 C)-99.5 F (37.5 C)] 98.8 F (37.1 C) (12/03 0800) Pulse Rate:  [85-115] 85 (12/03 1000) Resp:  [10-28] 10 (12/03 1000) BP: (83-122)/(49-79) 109/65 mmHg (12/03 1000) SpO2:  [97 %-100 %] 99 % (12/03 1000) FiO2 (%):  [40 %] 40 % (12/03 0910) Weight:  [192 lb 7.4 oz (87.3 kg)-205 lb 11 oz (93.3 kg)] 205 lb 11 oz (93.3 kg) (12/03 0500) Last BM Date: 03/28/14  Intake/Output from previous day: 12/02 0701 - 12/03 0700 In: 4160.3 [I.V.:3145; NG/GT:315.3; IV Piggyback:700] Out: 1385 [Urine:1385] Intake/Output this shift: Total I/O In: 600 [I.V.:410; NG/GT:140; IV Piggyback:50] Out: 325 [Urine:325]  Medications Current Facility-Administered Medications  Medication Dose Route Frequency Provider Last Rate Last Dose  . acetaminophen (TYLENOL) suppository 650 mg  650 mg Rectal Q6H PRN Theressa Millard, MD   650 mg at 03/01/14 [phone]  . albuterol (PROVENTIL) (2.5 MG/3ML) 0.083% nebulizer solution 2.5 mg  2.5 mg Inhalation Q6H PRN Theressa Millard, MD      . cefTRIAXone (ROCEPHIN) 2 g in dextrose 5 % 50 mL IVPB - Premix  2 g Intravenous Q12H [name], NP   2 g at 03/02/14 1030  . fentaNYL (SUBLIMAZE)  injection 12.5-25 mcg  12.5-25 mcg Intravenous Q2H PRN Wilhelmina Mcardle, MD      . hydrALAZINE (APRESOLINE) injection 5 mg  5 mg Intravenous Q6H Wilhelmina Mcardle, MD      . insulin aspart (novoLOG) injection 0-15 Units  0-15 Units Subcutaneous 6 times per day Wilhelmina Mcardle, MD      . insulin glargine (LANTUS) injection 15 Units  15 Units Subcutaneous QHS Wilhelmina Mcardle, MD      . LORazepam (ATIVAN) injection 2 mg  2 mg Intravenous PRN Wilhelmina Mcardle, MD      . methocarbamol (ROBAXIN) 500 mg in dextrose 5 % 50 mL IVPB  500 mg Intravenous Q8H PRN Theressa Millard, MD   500 mg at 02/27/14 1827  . ondansetron (ZOFRAN) injection 4 mg  4 mg Intravenous Q6H PRN Theressa Millard, MD      . vancomycin (VANCOCIN) IVPB 1000 mg/200 mL premix  1,000 mg Intravenous Q8H Jonetta Osgood, MD   1,000 mg at 03/02/14 0530    PE: General appearance: alert, cooperative and no distress Neck: Neck full Lungs: clear to auscultation bilaterally Heart: tachy rate, regular rhythm, 2/6 SM Extremities: TR LEE, no petechiae, splinter hemorrahges, osler nodes or janeway lesions Pulses: 2+ and symmetric Skin: warm and dry Neurologic: Mental status: alertness: alert, orientation: person, slowed mentation  Lab Results:   Recent Labs  03/01/14 0509 03/01/14 0910 03/02/14 0353  WBC 24.8* 21.7* 21.4*  HGB  12.1* 12.3* 11.3*  HCT 35.4* 35.9* 33.6*  PLT 186 168 175   BMET  Recent Labs  03/01/14 0509 03/01/14 0910 03/02/14 0353  NA 134* 132* 138  K 3.3* 4.0 4.4  CL 95* 93* 101  CO2 17* 18* 22  GLUCOSE 319* 408* 333*  BUN 18 19 21   CREATININE 0.99 1.07 0.86  CALCIUM 8.7 8.7 8.5    Assessment/Plan  Principal Problem:   Change in mental status Active Problems:   HYPERTENSION, BENIGN   Family history of ischemic heart disease   Intractable back pain   Hyperglycemia   DM (diabetes mellitus), type 2, uncontrolled   Hyperlipidemia   Gram-positive bacteremia   Severe sepsis   Acute endocarditis    Cardiomyopathy- EF 30-35%   CVA (cerebral infarction)- embolic   Acute respiratory failure   Cerebral septic emboli  1. Mitral Valve Endocarditis: Cultures growing gram positive cocci. On IV antibiotics per ID. Pt was seen and examined by TCTS. As the patient dose not demonstrate significant congestive heart failure, evidence of valve dehiscence or perforation or fistula formation, immediate surgical intervention is no yet indicated. Current recommendations are for aggressive antibiotic therapy and critical care support and follow-up echocardiogram as we progress with therapy. If any change in status, including the development of increasing regurgitation with congestive heart failure, valve dehiscence of perforation or persistent fever and bacteremia with optimal medical treatment or vegetations increasing in size in spite of antimicrobial therapy, then either mitral valve repair vs replacement will be considered. At some point, he will need a R/LHC prior to valvular surgery, if needed, however will need to see improvement in neuro status and fever with antibiotic therapy.   Currently, he is afebrile. Still with significant leukocytosis but WBC trending downward, at 21.4 today (24.8 yesterday). Still with some volume overload  Neurological status is slowing improving. He was extubated earlier today. He is slow to verbalize responses. He can state is name, birth month and day but cannot recall the year.    2. DM: Poorly controlled, currently with CBG levels >300. Diabetes Coordinator on board. Recs are to increase Lantus to 28 units daily and increase correction to resistant scale. Primary team will manage.   MD to follow with further recommendations.    LOS: 4 days    Brittainy M. Ladoris Gene 03/02/2014 11:42 AM   Patient seen and examined  I have reviewed note by B Rosita Fire and amended to reflect my findings WOuld give 1 dose lasix and follow response   Would add low dose lopressor   Titrate as bp allows.    Dorris Carnes

## 2014-03-02 NOTE — Progress Notes (Signed)
Inpatient Diabetes Program Recommendations  AACE/ADA: New Consensus Statement on Inpatient Glycemic Control (2013)  Target Ranges:  Prepandial:   less than 140 mg/dL      Peak postprandial:   less than 180 mg/dL (1-2 hours)      Critically ill patients:  140 - 180 mg/dL   Reason for Assessment:  Results for BENTLY, WYSS (MRN [id number]) as of 03/02/2014 10:46  Ref. Range 03/01/2014 16:43 03/01/2014 19:17 03/02/2014 00:05 03/02/2014 03:33 03/02/2014 07:25  Glucose-Capillary Latest Range: 70-99 mg/dL 263 (H) 230 (H) 330 (H) 310 (H) 374 (H)    Diabetes history: New onset  Note patient just extubated.  CBG's are very high, however he did receive steroid Decadron 12 mg x 2. Consider IV insulin for short time in order to get control of BG's.  Based on patient's weight, 0.3 units/kg, consider increasing Lantus to 28 units daily and increase correction to resistant.  Discussed with RN.  Thanks, Adah Perl, RN, BC-ADM Inpatient Diabetes Coordinator Pager [phone]

## 2014-03-02 NOTE — Procedures (Signed)
Extubation Procedure Note  Patient Details:   Name: [name] DOB: [birth year] MRN: [id number]   Airway Documentation:  Airway 8 mm (Active)  Secured at (cm) 24 cm 03/02/2014  9:10 AM  Measured From Lips 03/02/2014  9:10 AM  Secured Location Left 03/02/2014  9:10 AM  Secured By [employer] 03/02/2014  9:10 AM  Tube Holder Repositioned Yes 03/02/2014  9:10 AM  Cuff Pressure (cm H2O) 24 cm H2O 03/02/2014  4:17 AM  Site Condition Dry 03/02/2014  4:17 AM    Evaluation  O2 sats: stable throughout and currently acceptable Complications: No apparent complications Patient did tolerate procedure well. Bilateral Breath Sounds: Clear Suctioning: Airway Yes   Prior to extubation: pt suctioned orally and via ETT, Positive cuff leak noted.  Post-extubation: pt able to phonate adequately, cough to produce sputum, no stridor noted.  Miquel Dunn 03/02/2014, 10:27 AM

## 2014-03-02 NOTE — Progress Notes (Signed)
STROKE TEAM PROGRESS NOTE   HISTORY Much of history obtained from chart as patient is intubated and sedated.   [name] is an 48 y.o. male admitted by [facility] on 02/26/14 after having a fever of 105.8 and c/o intractable back pain. Recent travel out of [place] so tests being run for that. Recent excision of cysts/lipomas with postop ? infection and was placed on 7days of Levaquin. However, he continued to have fever and chills at home and came to ED. ID has consulted and ordered various tests. No IV antibiotics were started given there was no focal etiology of infection. Over night he became tachycardic noted he was not feeling well, BC returned positive for gram positive cocci in chains and placed on ABX Temperature was noted to raise to 103.6. He was then noted to have a left gaze preference. Patient was brought to CT scan where he was noted to have a peroid of arms flexed and mild tremor--there was question of riggers versus seizure. On the floor he had another episode of generalized tremulous activity and "teeth chattering" which again was possibly riggers versus seizure. During this time he was not alert and only moaning while moving from bed to bed. He was intubated due to airway compromise. MRI showed embolic shower.   He also has been complaining of headaches over the past few days.   SUBJECTIVE (INTERVAL HISTORY) His wife is at the bedside.  He was extubated at 715a today. Not taking at first, now with increased speech. Pt confused.   OBJECTIVE Temp:  [97.5 F (36.4 C)-99.5 F (37.5 C)] 98.8 F (37.1 C) (12/03 0800) Pulse Rate:  [85-115] 85 (12/03 1000) Cardiac Rhythm:  [-] Normal sinus rhythm (12/03 0800) Resp:  [10-28] 10 (12/03 1000) BP: (83-122)/(49-79) 109/65 mmHg (12/03 1000) SpO2:  [97 %-100 %] 99 % (12/03 1000) FiO2 (%):  [40 %] 40 % (12/03 0910) Weight:  [87.3 kg (192 lb 7.4 oz)-93.3 kg (205 lb 11 oz)] 93.3 kg (205 lb 11 oz) (12/03 0500)   Recent Labs Lab  03/01/14 1643 03/01/14 1917 03/02/14 0005 03/02/14 0333 03/02/14 0725  GLUCAP 263* 230* 330* 310* 374*    Recent Labs Lab 02/27/14 0437 02/28/14 0355 03/01/14 0509 03/01/14 0910 03/02/14 0353  NA 134* 134* 134* 132* 138  K 4.1 4.4 3.3* 4.0 4.4  CL 96 96 95* 93* 101  CO2 23 24 17* 18* 22  GLUCOSE 288* 273* 319* 408* 333*  BUN 16 16 18 19 21   CREATININE 0.84 0.91 0.99 1.07 0.86  CALCIUM 8.9 9.1 8.7 8.7 8.5    Recent Labs Lab 02/26/14 1329 03/01/14 0910  AST 17 25  ALT 21 34  ALKPHOS 66 93  BILITOT 0.6 0.9  PROT 7.5 6.7  ALBUMIN 3.0* 2.4*    Recent Labs Lab 02/26/14 1329 02/27/14 0437 02/28/14 0355 03/01/14 0509 03/01/14 0910 03/02/14 0353  WBC 10.9* 9.8 12.6* 24.8* 21.7* 21.4*  NEUTROABS 9.3*  --   --  23.6* 21.1*  --   HGB 13.6 13.0 13.0 12.1* 12.3* 11.3*  HCT 39.0 37.0* 36.9* 35.4* 35.9* 33.6*  MCV 89.2 91.1 88.5 90.1 90.9 90.6  PLT 183 170 185 186 168 175   No results for input(s): CKTOTAL, CKMB, CKMBINDEX, TROPONINI in the last 168 hours. No results for input(s): LABPROT, INR in the last 72 hours. No results for input(s): COLORURINE, LABSPEC, [place], GLUCOSEU, HGBUR, BILIRUBINUR, KETONESUR, PROTEINUR, UROBILINOGEN, NITRITE, LEUKOCYTESUR in the last 72 hours.  Invalid input(s): APPERANCEUR  Component Value Date/Time   CHOL 139 04/27/2009 0845   TRIG 69.0 04/27/2009 0845   HDL 45.10 04/27/2009 0845   CHOLHDL 3 04/27/2009 0845   VLDL 13.8 04/27/2009 0845   LDLCALC 80 04/27/2009 0845   Lab Results  Component Value Date   HGBA1C 10.3* 02/27/2014   No results found for: LABOPIA, COCAINSCRNUR, LABBENZ, AMPHETMU, THCU, LABBARB  No results for input(s): ETH in the last 168 hours.  Dg Chest 2 View  02/28/2014   CLINICAL DATA:  Fever, history diabetes, hyperlipidemia, hypertension, scoliosis  EXAM: CHEST  2 VIEW  COMPARISON:  01/02/2009  FINDINGS: Lordotic AP positioning.  Normal heart size, mediastinal contours and pulmonary vascularity for  technique.  Minimal chronic central peribronchial thickening and hyperinflation.  No definite infiltrate, pleural effusion or pneumothorax.  Bones demineralized.  IMPRESSION: No acute abnormalities.  Resolution of previously seen RIGHT upper lobe infiltrate.   Electronically Signed   By: Lavonia Dana M.D.   On: 02/28/2014 12:05   Ct Head Wo Contrast  03/01/2014   CLINICAL DATA:  Became unresponsive less than 1 hour ago. Looking constantly to the left. Initial encounter.  EXAM: CT HEAD WITHOUT CONTRAST  TECHNIQUE: Contiguous axial images were obtained from the base of the skull through the vertex without intravenous contrast.  COMPARISON:  None.  FINDINGS: There is no evidence of acute infarction, mass lesion, or intra- or extra-axial hemorrhage on CT.  Mild periventricular white matter change likely reflects small vessel ischemic microangiopathy.  The posterior fossa, including the cerebellum, brainstem and fourth ventricle, is within normal limits. The third and lateral ventricles, and basal ganglia are unremarkable in appearance. The cerebral hemispheres are symmetric in appearance, with normal gray-white differentiation. No mass effect or midline shift is seen.  There is no evidence of fracture; visualized osseous structures are unremarkable in appearance. The orbits are within normal limits. The paranasal sinuses and mastoid air cells are well-aerated. No significant soft tissue abnormalities are seen.  IMPRESSION: 1. No acute intracranial pathology seen on CT. 2. Mild small vessel ischemic microangiopathy.   Electronically Signed   By: Garald Balding M.D.   On: 03/01/2014 04:47   Mr [name] IR Contrast  03/01/2014   CLINICAL DATA:  Seizure. Right sided visual neglect. Transient alteration of awareness  EXAM: MRI HEAD WITHOUT AND WITH CONTRAST  TECHNIQUE: Multiplanar, multiecho pulse sequences of the brain and surrounding structures were obtained without and with intravenous contrast.  CONTRAST:  70mL  MULTIHANCE GADOBENATE DIMEGLUMINE 529 MG/ML IV SOLN  COMPARISON:  CT head 03/01/2014  FINDINGS: The patient was sedated with Ativan however remained agitated and had difficulty holding still. There is progressive motion on the study and the patient was not able to complete the final postcontrast coronal sequence.  Multiple areas of acute infarct are noted. Small areas are restricted diffusion are present bilaterally including the right cerebellum, left occipital pole, left medial occipital lobe, left parietal lobe, right parietal periventricular white matter, head of caudate on the left. This pattern suggests multiple small emboli.  Ventricle size is normal. No cortical infarct. Cerebral volume is normal.  Microhemorrhage in the left external capsule. Question history of hypertension. No other areas of hemorrhage.  Postcontrast images degraded by motion however no enhancing lesions are identified.  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  Chronic microhemorrhage in the left external capsule. Question hypertension   Electronically Signed   By: Jorja Loa.D.  On: 03/01/2014 08:07   Dg Chest Port 1 View  03/02/2014   CLINICAL DATA:  Follow-up of respiratory failure  EXAM: PORTABLE CHEST - 1 VIEW  COMPARISON:  Portable chest x-rays of March 01, 2014.  FINDINGS: The lungs are well-expanded. There is no focal infiltrate. The interstitial markings remain minimally prominent. The cardiac silhouette is top-normal in size. The pulmonary vascularity is less engorged today. There is no pleural effusion or pneumothorax.  The endotracheal tube tip lies 4.2 cm above the crotch of the carina. The esophagogastric tube tip projects below the expected location of the GE junction.  IMPRESSION: Further improvement in the appearance of the pulmonary interstitium and central pulmonary vascularity is consistent with ongoing resolution of  pulmonary interstitial edema. There is no evidence of pneumonia. The support tubes and lines are in appropriate position.   Electronically Signed   By: David  [place]   On: 03/02/2014 07:45   Dg Chest Port 1 View  03/01/2014   CLINICAL DATA:  ET tube placement.  Fever and infection.  EXAM: PORTABLE CHEST - 1 VIEW  COMPARISON:  03/01/2014  FINDINGS: Endotracheal tube terminates 5.3 cm above carina. Nasogastric terminates of bodies stomach with side port just below the gastroesophageal junction.  Mild cardiomegaly. No pleural effusion or pneumothorax. improved pulmonary interstitial prominence. No lobar consolidation. Extends beyond the inferior aspect of the film.  IMPRESSION: Improved to resolved interstitial prominence. Favored to represent resolving pulmonary edema.  Appropriate position of endotracheal and nasogastric tubes.   Electronically Signed   By: Abigail Miyamoto M.D.   On: 03/01/2014 09:26   Dg Chest Port 1 View  03/01/2014   CLINICAL DATA:  Acute onset of fever. Seizures. Unresponsive. Initial encounter.  EXAM: PORTABLE CHEST - 1 VIEW  COMPARISON:  Chest radiograph performed 02/28/2014  FINDINGS: The lungs are hypoexpanded. Vascular congestion is noted, with increased interstitial markings, concerning for mild interstitial edema. Alternatively, an atypical infectious process might have a similar appearance, new from the prior study. There is no evidence of pleural effusion or pneumothorax.  The cardiomediastinal silhouette is borderline enlarged. No acute osseous abnormalities are seen.  IMPRESSION: Lungs hypoexpanded. Vascular congestion and borderline cardiomegaly noted. Increased interstitial markings raise question for mild interstitial edema. Alternatively, an atypical infectious process might have a similar appearance, new from the prior study.   Electronically Signed   By: Garald Balding M.D.   On: 03/01/2014 05:50   EEG 03/01/2014 This EEG is most consistent with a generalized non-specific  cerebral dysfunction(encephalopathy). There was no seizure or seizure predisposition recorded on this study.   2-D echocardiogram 03/01/2014 - There is a large echodensity measuring 1.4 x 2 cm attached to the posterior leaflet of the mitral valve. Anterior lealet is mildlythickened at the tip. There are mutiple jets of mitralregurgitation with at least moderate regurgitation.   TEE 03/01/2014  A large echodensity measuring 1.4 x 2 cm is attached to theposterior leaflet of the mitral valve suspicious forendocarditis. It affects predominantly the P2 scallop but extendspartially into P1 and P3 scallops. There is translucent area inthe middle of the posterior leaflet suspicious of an abscess formation. No obvious perforation.Anterior lealet is mildly thickened at the tip. There are mutiplejets of mitral regurgitation with at least moderateregurgitation. There is systolic flow reversal in the right upperpulmonary vein (not left upper PV).No other valves are affected. No abcess in the intervalvularfibrosa.   PHYSICAL EXAM   young caucasian male just extubated.Awake alert. Afebrile. Head is nontraumatic. Neck is supple without bruit.  Hearing is normal. Cardiac exam no murmur or gallop. Lungs are clear to auscultation. Distal pulses are well felt. Neurological Exam :   Drowsy but can be aroused and answers simple questions but doses of easily oriented x 1. Hesitant nonfluent speech and perseveration of thought. Poor attention, registration and recall. Easy distractibility.eye movements full without nystagmus.fundi were not visualized. Vision acuity and fields appear normal. Hearing is normal. Palatal movements are normal. Face symmetric. Tongue midline. Normal strength, tone, reflexes and coordination. Normal sensation. Gait deferred.  ASSESSMENT/PLAN [name] is a 48 y.o. male with recent excision of 30 lipomas with postop infection. Admitted with temperature 105.8. 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 Antibiotic treatment per ID. Cardiology following. If needed, plan heart cath prior to possible valvular surgery.  Current neurologic deficits unclear due to metabolic condition and recent extubation. Based on current MRI results, anticipate cognitive deficits only. We will follow over the next few days for a clearer neurological picture.  MRI  multiple tiny bicerebral embolic infarcts  2D Echo  Mitral valve echodensity. LV dysfunction with EF 30-35%. Moderate mitral valve regurgitation.  TEE  Posterior leaflet mitral valve echodensity  SCDs for VTE prophylaxis  Diet NPO time specified   aspirin 81 mg orally every day prior to admission, now on no antithrombotics due to bleeding risk  Will follow neuro progress  Therapy recommendations:  Recommend PT, OT & ST for swallow and language once patient able to tolerate  Disposition:  pending   Hypertension  Stable  Hyperlipidemia  Home meds:  lipitor 10,  Not yet resumed in hospital  LDL not drawn  Resume statin once able to swallow  Continue statin at discharge  Diabetes type 2  Steroid-induced hyperglycemia  HgbA1c 10.3, goal < 7.0  Uncontrolled  Other Stroke Risk Factors  ETOH use  Other Active Problems  Lipomas x 30, removed Sept 11 at [place]   Other pertinent history  History of Wilms tumor. Solitary kidney. High-risk of acute kidney injury.  Recent travel out of the country on business    Hospital day # [place] for Pager information 03/02/2014 11:24 AM   I have personally examined this patient, reviewed notes, independently viewed imaging studies, participated in medical decision making and plan of care. I have made any additions or  clarifications directly to the above note. Agree with note above.patient exam is limited by sedation and recent extubation but I do not see any focal weakness. D/W wife and answered questions. Stroke team wll follow.  Antony Contras, MD Medical Director [facility] Pager: [phone] 03/04/2014 8:47 AM   To contact Stroke Continuity provider, please refer to [email or web address] After hours, contact General Neurology

## 2014-03-02 NOTE — Progress Notes (Signed)
PT Cancellation Note  Patient Details Name: [name] MRN: [id number] DOB: 1965/12/25   Cancelled Treatment:    Reason Eval/Treat Not Completed: Medical issues which prohibited therapy (pt intubated since PT order. Will sign off and need new order for therapy)   Lanetta Inch [facility] 03/02/2014, 7:19 AM Elwyn Reach, [place]

## 2014-03-02 NOTE — Clinical Documentation Improvement (Addendum)
  Medicare rules require specification as to whether an inpatient diagnosis was present at the time of admission.   Please clarify if the following diagnosis "Sepsis" was:    Likely, Probable, Suspected or Known Sepsis Present and/or Evolving at the time of admission   NOT Present or Evolving on Admission and Sepsis developed during the inpatient stay   Unable to clinically determine whether the condition was present on admission   Thank You, Erling Conte ,RN Clinical Documentation Specialist:  [phone] [place]

## 2014-03-02 NOTE — Progress Notes (Addendum)
INFECTIOUS DISEASE PROGRESS NOTE  ID: [name] is a 48 y.o. male with  Principal Problem:   Change in mental status Active Problems:   HYPERTENSION, BENIGN   Family history of ischemic heart disease   Intractable back pain   Hyperglycemia   DM (diabetes mellitus), type 2, uncontrolled   Hyperlipidemia   Gram-positive bacteremia   Severe sepsis   Acute endocarditis   Cardiomyopathy- EF 30-35%   CVA (cerebral infarction)- embolic   Acute respiratory failure   Cerebral septic emboli  Subjective: Just awakening post extubation  Abtx:  Anti-infectives    Start     Dose/Rate Route Frequency Ordered Stop   03/01/14 1000  cefTRIAXone (ROCEPHIN) 2 g in dextrose 5 % 50 mL IVPB - Premix     2 g100 mL/hr over 30 Minutes Intravenous Every 12 hours 03/01/14 0907     03/01/14 0500  vancomycin (VANCOCIN) IVPB 1000 mg/200 mL premix     1,000 mg200 mL/hr over 60 Minutes Intravenous Every 8 hours 03/01/14 0301     03/01/14 0400  cefTAZidime (FORTAZ) 2 g in dextrose 5 % 50 mL IVPB  Status:  Discontinued     2 g100 mL/hr over 30 Minutes Intravenous 3 times per day 03/01/14 0301 03/01/14 0907      Medications:  Scheduled: . cefTRIAXone (ROCEPHIN)  IV  2 g Intravenous Q12H  . hydrALAZINE  5 mg Intravenous Q6H  . insulin aspart  0-15 Units Subcutaneous 6 times per day  . insulin glargine  15 Units Subcutaneous QHS  . vancomycin  1,000 mg Intravenous Q8H    Objective: Vital signs in last 24 hours: Temp:  [97.5 F (36.4 C)-99.5 F (37.5 C)] 99.1 F (37.3 C) (12/03 0400) Pulse Rate:  [88-122] 88 (12/03 0600) Resp:  [12-28] 14 (12/03 0600) BP: (83-113)/(49-75) 106/62 mmHg (12/03 0600) SpO2:  [97 %-100 %] 99 % (12/03 0910) FiO2 (%):  [40 %] 40 % (12/03 0910) Weight:  [87.3 kg (192 lb 7.4 oz)-93.3 kg (205 lb 11 oz)] 93.3 kg (205 lb 11 oz) (12/03 0500)   General appearance: fatigued and no distress Resp: clear to auscultation bilaterally Cardio: tachycardia GI: normal  findings: bowel sounds normal and soft, non-tender  Lab Results  Recent Labs  03/01/14 0910 03/02/14 0353  WBC 21.7* 21.4*  HGB 12.3* 11.3*  HCT 35.9* 33.6*  NA 132* 138  K 4.0 4.4  CL 93* 101  CO2 18* 22  BUN 19 21  CREATININE 1.07 0.86   Liver Panel  Recent Labs  03/01/14 0910  PROT 6.7  ALBUMIN 2.4*  AST 25  ALT 34  ALKPHOS 93  BILITOT 0.9   Sedimentation Rate  Recent Labs  02/28/14 1734  ESRSEDRATE 56*   C-Reactive Protein  Recent Labs  02/28/14 1734  CRP 20.2*    Microbiology: Recent Results (from the past 240 hour(s))  Malaria smear     Status: None   Collection Time: 02/28/14  3:55 AM  Result Value Ref Range Status   Specimen Description BLOOD  Final   Special Requests Normal  Final   Malaria Prep   Final    No Plasmodium or Other Blood Parasites Seen on Thick or Thin Smears For persons strongly suspected of having a blood parasite,but have negative smears, it is recommended that blood films be repeated approximately every 12 to 24 hours for 3 consecutive  days. Performed at [facility]    Report Status 03/01/2014 FINAL  Final  Culture, blood (  routine x 2)     Status: None (Preliminary result)   Collection Time: 02/28/14  9:10 AM  Result Value Ref Range Status   Specimen Description BLOOD LEFT HAND  Final   Special Requests BOTTLES DRAWN AEROBIC ONLY 5CC  Final   Culture  Setup Time   Final    02/28/2014 13:28 Performed at [facility]    Culture   Final    [place] Note: Gram Stain Report Called to,Read Back By and Verified With:  CAROL MERRITT @0310  03/01/14 SMIAS Performed at [facility]    Report Status PENDING  Incomplete  Culture, blood (routine x 2)     Status: None (Preliminary result)   Collection Time: 02/28/14  9:10 AM  Result Value Ref Range Status   Specimen Description BLOOD LEFT ARM  Final   Special Requests BOTTLES DRAWN AEROBIC AND ANAEROBIC 5CC  Final   Culture  Setup  Time   Final    02/28/2014 13:28 Performed at [facility]    Culture   Final    [place] Note: Gram Stain Report Called to,Read Back By and Verified With: MONTRESSA @0204  03/01/14 SMIAS Performed at [facility]    Report Status PENDING  Incomplete  MRSA PCR Screening     Status: None   Collection Time: 03/01/14  5:15 AM  Result Value Ref Range Status   MRSA by PCR NEGATIVE NEGATIVE Final    Comment:        The GeneXpert MRSA Assay (FDA approved for NASAL specimens only), is one component of a comprehensive MRSA colonization surveillance program. It is not intended to diagnose MRSA infection nor to guide or monitor treatment for MRSA infections.     Studies/Results: Dg Chest 2 View  02/28/2014   CLINICAL DATA:  Fever, history diabetes, hyperlipidemia, hypertension, scoliosis  EXAM: CHEST  2 VIEW  COMPARISON:  01/02/2009  FINDINGS: Lordotic AP positioning.  Normal heart size, mediastinal contours and pulmonary vascularity for technique.  Minimal chronic central peribronchial thickening and hyperinflation.  No definite infiltrate, pleural effusion or pneumothorax.  Bones demineralized.  IMPRESSION: No acute abnormalities.  Resolution of previously seen RIGHT upper lobe infiltrate.   Electronically Signed   By: Lavonia Dana M.D.   On: 02/28/2014 12:05   Ct Head Wo Contrast  03/01/2014   CLINICAL DATA:  Became unresponsive less than 1 hour ago. Looking constantly to the left. Initial encounter.  EXAM: CT HEAD WITHOUT CONTRAST  TECHNIQUE: Contiguous axial images were obtained from the base of the skull through the vertex without intravenous contrast.  COMPARISON:  None.  FINDINGS: There is no evidence of acute infarction, mass lesion, or intra- or extra-axial hemorrhage on CT.  Mild periventricular white matter change likely reflects small vessel ischemic microangiopathy.  The posterior fossa, including the cerebellum, brainstem and fourth ventricle, is  within normal limits. The third and lateral ventricles, and basal ganglia are unremarkable in appearance. The cerebral hemispheres are symmetric in appearance, with normal gray-white differentiation. No mass effect or midline shift is seen.  There is no evidence of fracture; visualized osseous structures are unremarkable in appearance. The orbits are within normal limits. The paranasal sinuses and mastoid air cells are well-aerated. No significant soft tissue abnormalities are seen.  IMPRESSION: 1. No acute intracranial pathology seen on CT. 2. Mild small vessel ischemic microangiopathy.   Electronically Signed   By: Garald Balding M.D.   On: 03/01/2014 04:47  Mr [name] Wo Contrast  03/01/2014   CLINICAL DATA:  Seizure. Right sided visual neglect. Transient alteration of awareness  EXAM: MRI HEAD WITHOUT AND WITH CONTRAST  TECHNIQUE: Multiplanar, multiecho pulse sequences of the brain and surrounding structures were obtained without and with intravenous contrast.  CONTRAST:  82mL MULTIHANCE GADOBENATE DIMEGLUMINE 529 MG/ML IV SOLN  COMPARISON:  CT head 03/01/2014  FINDINGS: The patient was sedated with Ativan however remained agitated and had difficulty holding still. There is progressive motion on the study and the patient was not able to complete the final postcontrast coronal sequence.  Multiple areas of acute infarct are noted. Small areas are restricted diffusion are present bilaterally including the right cerebellum, left occipital pole, left medial occipital lobe, left parietal lobe, right parietal periventricular white matter, head of caudate on the left. This pattern suggests multiple small emboli.  Ventricle size is normal. No cortical infarct. Cerebral volume is normal.  Microhemorrhage in the left external capsule. Question history of hypertension. No other areas of hemorrhage.  Postcontrast images degraded by motion however no enhancing lesions are identified.  Negative for mass or edema   IMPRESSION: Multiple small areas of acute infarct bilaterally, consistent with cerebral emboli.  Chronic microhemorrhage in the left external capsule. Question hypertension   Electronically Signed   By: Franchot Gallo M.D.   On: 03/01/2014 08:07   Dg Chest Port 1 View  03/02/2014   CLINICAL DATA:  Follow-up of respiratory failure  EXAM: PORTABLE CHEST - 1 VIEW  COMPARISON:  Portable chest x-rays of March 01, 2014.  FINDINGS: The lungs are well-expanded. There is no focal infiltrate. The interstitial markings remain minimally prominent. The cardiac silhouette is top-normal in size. The pulmonary vascularity is less engorged today. There is no pleural effusion or pneumothorax.  The endotracheal tube tip lies 4.2 cm above the crotch of the carina. The esophagogastric tube tip projects below the expected location of the GE junction.  IMPRESSION: Further improvement in the appearance of the pulmonary interstitium and central pulmonary vascularity is consistent with ongoing resolution of pulmonary interstitial edema. There is no evidence of pneumonia. The support tubes and lines are in appropriate position.   Electronically Signed   By: David  Martinique   On: 03/02/2014 07:45   Dg Chest Port 1 View  03/01/2014   CLINICAL DATA:  ET tube placement.  Fever and infection.  EXAM: PORTABLE CHEST - 1 VIEW  COMPARISON:  03/01/2014  FINDINGS: Endotracheal tube terminates 5.3 cm above carina. Nasogastric terminates of bodies stomach with side port just below the gastroesophageal junction.  Mild cardiomegaly. No pleural effusion or pneumothorax. improved pulmonary interstitial prominence. No lobar consolidation. Extends beyond the inferior aspect of the film.  IMPRESSION: Improved to resolved interstitial prominence. Favored to represent resolving pulmonary edema.  Appropriate position of endotracheal and nasogastric tubes.   Electronically Signed   By: Abigail Miyamoto M.D.   On: 03/01/2014 09:26   Dg Chest Port 1  View  03/01/2014   CLINICAL DATA:  Acute onset of fever. Seizures. Unresponsive. Initial encounter.  EXAM: PORTABLE CHEST - 1 VIEW  COMPARISON:  Chest radiograph performed 02/28/2014  FINDINGS: The lungs are hypoexpanded. Vascular congestion is noted, with increased interstitial markings, concerning for mild interstitial edema. Alternatively, an atypical infectious process might have a similar appearance, new from the prior study. There is no evidence of pleural effusion or pneumothorax.  The cardiomediastinal silhouette is borderline enlarged. No acute osseous abnormalities are seen.  IMPRESSION: Lungs hypoexpanded. Vascular  congestion and borderline cardiomegaly noted. Increased interstitial markings raise question for mild interstitial edema. Alternatively, an atypical infectious process might have a similar appearance, new from the prior study.   Electronically Signed   By: Garald Balding M.D.   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 MVR per CVTS  Add- BCx prelim enterococci         Bobby Rumpf Infectious Diseases (pager) [phone] [email or web address] 03/02/2014, 10:27 AM  LOS: 4 days

## 2014-03-02 NOTE — Progress Notes (Addendum)
Patient ID: [name], male   DOB: [birth year], 48 y.o.   MRN: [id number] T[facility] DAILY ICU PROGRESS NOTE                   [address]            [place] [id number]          [phone]        Total Length of Stay:  LOS: 4 days   Subjective: Sedated on vent ,but responds to name , opens eyes and moves anll extremities to command No fever overnight  Objective: Vital signs in last 24 hours: Temp:  [97.5 F (36.4 C)-99.5 F (37.5 C)] 99.1 F (37.3 C) (12/03 0400) Pulse Rate:  [88-148] 88 (12/03 0600) Cardiac Rhythm:  [-] Normal sinus rhythm (12/03 0600) Resp:  [12-34] 14 (12/03 0600) BP: (83-169)/(49-94) 106/62 mmHg (12/03 0600) SpO2:  [97 %-100 %] 99 % (12/03 0600) FiO2 (%):  [40 %-100 %] 40 % (12/03 0600) Weight:  [192 lb 7.4 oz (87.3 kg)-205 lb 11 oz (93.3 kg)] 205 lb 11 oz (93.3 kg) (12/03 0500)  Filed Weights   02/27/14 0957 03/01/14 1303 03/02/14 0500  Weight: 190 lb (86.183 kg) 192 lb 7.4 oz (87.3 kg) 205 lb 11 oz (93.3 kg)    Weight change:    Hemodynamic parameters for last 24 hours:    Intake/Output from previous day: 12/02 0701 - 12/03 0700 In: 4160.3 [I.V.:3145; NG/GT:315.3; IV Piggyback:700] Out: 1385 [Urine:1385]  Intake/Output this shift:    Current Meds: Scheduled Meds: . antiseptic oral rinse  7 mL Mouth Rinse QID  . cefTRIAXone (ROCEPHIN)  IV  2 g Intravenous Q12H  . chlorhexidine  15 mL Mouth Rinse BID  . dexamethasone  12 mg Intravenous 4 times per day  . docusate  50 mg Oral Daily  . enoxaparin (LOVENOX) injection  40 mg Subcutaneous Q24H  . fluticasone  1 puff Inhalation BID  . Influenza vac split quadrivalent PF  0.5 mL Intramuscular Tomorrow-1000  . insulin aspart  0-20 Units Subcutaneous Q4H  . insulin glargine  15 Units Subcutaneous QHS  . insulin starter kit- pen needles  1 kit Other Once  . pantoprazole sodium  40 mg Per Tube Q1200  . pneumococcal 23 valent vaccine  0.5 mL Intramuscular Tomorrow-1000  .  vancomycin  1,000 mg Intravenous Q8H   Continuous Infusions: . sodium chloride 125 mL/hr at 03/02/14 0400  . feeding supplement (VITAL 1.5 CAL) 1,000 mL (03/02/14 0600)  . fentaNYL infusion INTRAVENOUS 100 mcg/hr (03/02/14 0633)   PRN Meds:.[DISCONTINUED] acetaminophen **OR** acetaminophen, albuterol, alum & mag hydroxide-simeth, fentaNYL, hydrALAZINE, LORazepam, methocarbamol (ROBAXIN)  IV, [DISCONTINUED] ondansetron **OR** ondansetron (ZOFRAN) IV  General appearance: cooperative and remais sedated on vent Neurologic: intact grossly  Heart: regular rate and rhythm, S1, S2 normal, no murmur, click, rub or gallop Lungs: diminished breath sounds bibasilar Abdomen: soft, non-tender; bowel sounds normal; no masses,  no organomegaly Extremities: extremities normal, atraumatic, no cyanosis or edema and Homans sign is negative, no sign of DVT No evidence of emboli to digits  Lab Results: CBC: Recent Labs  03/01/14 0910 03/02/14 0353  WBC 21.7* 21.4*  HGB 12.3* 11.3*  HCT 35.9* 33.6*  PLT 168 175   BMET:  Recent Labs  03/01/14 0910 03/02/14 0353  NA 132* 138  K 4.0 4.4  CL 93* 101  CO2 18* 22  GLUCOSE 408* 333*  BUN 19 21  CREATININE 1.07 0.86  CALCIUM 8.7  8.5    PT/INR: No results for input(s): LABPROT, INR in the last 72 hours. Radiology:   Ct Head Wo Contrast  03/01/2014   CLINICAL DATA:  Became unresponsive less than 1 hour ago. Looking constantly to the left. Initial encounter.  EXAM: CT HEAD WITHOUT CONTRAST  TECHNIQUE: Contiguous axial images were obtained from the base of the skull through the vertex without intravenous contrast.  COMPARISON:  None.  FINDINGS: There is no evidence of acute infarction, mass lesion, or intra- or extra-axial hemorrhage on CT.  Mild periventricular white matter change likely reflects small vessel ischemic microangiopathy.  The posterior fossa, including the cerebellum, brainstem and fourth ventricle, is within normal limits. The third and  lateral ventricles, and basal ganglia are unremarkable in appearance. The cerebral hemispheres are symmetric in appearance, with normal gray-white differentiation. No mass effect or midline shift is seen.  There is no evidence of fracture; visualized osseous structures are unremarkable in appearance. The orbits are within normal limits. The paranasal sinuses and mastoid air cells are well-aerated. No significant soft tissue abnormalities are seen.  IMPRESSION: 1. No acute intracranial pathology seen on CT. 2. Mild small vessel ischemic microangiopathy.   Electronically Signed   By: Garald Balding M.D.   On: 03/01/2014 04:47   Mr [name] [place] Contrast  03/01/2014   CLINICAL DATA:  Seizure. Right sided visual neglect. Transient alteration of awareness  EXAM: MRI HEAD WITHOUT AND WITH CONTRAST  TECHNIQUE: Multiplanar, multiecho pulse sequences of the brain and surrounding structures were obtained without and with intravenous contrast.  CONTRAST:  36m MULTIHANCE GADOBENATE DIMEGLUMINE 529 MG/ML IV SOLN  COMPARISON:  CT head 03/01/2014  FINDINGS: The patient was sedated with Ativan however remained agitated and had difficulty holding still. There is progressive motion on the study and the patient was not able to complete the final postcontrast coronal sequence.  Multiple areas of acute infarct are noted. Small areas are restricted diffusion are present bilaterally including the right cerebellum, left occipital pole, left medial occipital lobe, left parietal lobe, right parietal periventricular white matter, head of caudate on the left. This pattern suggests multiple small emboli.  Ventricle size is normal. No cortical infarct. Cerebral volume is normal.  Microhemorrhage in the left external capsule. Question history of hypertension. No other areas of hemorrhage.  Postcontrast images degraded by motion however no enhancing lesions are identified.  Negative for mass or edema  IMPRESSION: Multiple small areas of acute  infarct bilaterally, consistent with cerebral emboli.  Chronic microhemorrhage in the left external capsule. Question hypertension   Electronically Signed   By: CFranchot GalloM.D.   On: 03/01/2014 08:07   Dg Chest Port 1 View  03/02/2014   CLINICAL DATA:  Follow-up of respiratory failure  EXAM: PORTABLE CHEST - 1 VIEW  COMPARISON:  Portable chest x-rays of March 01, 2014.  FINDINGS: The lungs are well-expanded. There is no focal infiltrate. The interstitial markings remain minimally prominent. The cardiac silhouette is top-normal in size. The pulmonary vascularity is less engorged today. There is no pleural effusion or pneumothorax.  The endotracheal tube tip lies 4.2 cm above the crotch of the carina. The esophagogastric tube tip projects below the expected location of the GE junction.  IMPRESSION: Further improvement in the appearance of the pulmonary interstitium and central pulmonary vascularity is consistent with ongoing resolution of pulmonary interstitial edema. There is no evidence of pneumonia. The support tubes and lines are in appropriate position.   Electronically Signed   By:  David  Martinique   On: 03/02/2014 07:45   Assessment/Plan: Mitral endocarditis , with fever down and prob embolic cerebral infarction Leucocytosis continues  No heart failure currently Full id of organism still pending Diabetes control still out of range  Jarissa Sheriff B 03/02/2014 8:10 AM

## 2014-03-02 NOTE — Clinical Documentation Improvement (Deleted)
  Medicare rules require specification as to whether an inpatient diagnosis was present at the time of admission.    Please clarify if the following diagnosis Sepsis" was:     Marland Kitchen Present and/or Evolving at the time of admission  . NOT Present or Evolving on Admission and it developed during the inpatient stay  . Unable to clinically determine whether the condition was present on admission  . Documentation insufficient to determine if condition was present at the time of inpatient admission  Thank You, Erling Conte ,RN Clinical Documentation Specialist:  [facility]

## 2014-03-02 NOTE — Plan of Care (Signed)
Problem: Phase I Progression Outcomes Goal: VTE prophylaxis Outcome: Completed/Met Date Met:  03/02/14 Goal: Oral Care per Protocol Outcome: Completed/Met Date Met:  03/02/14 Goal: HOB elevated 30 degrees Outcome: Completed/Met Date Met:  03/02/14 Goal: Sedation Protocol initiated if indicated Outcome: Completed/Met Date Met:  03/02/14 Goal: Hemodynamically stable Outcome: Completed/Met Date Met:  03/02/14 Goal: Baseline oxygen/pH stable Outcome: Completed/Met Date Met:  03/02/14 Goal: Patient tolerating weaning plan Outcome: Completed/Met Date Met:  03/02/14  Problem: Phase II Progression Outcomes Goal: Date pt extubated/weaned off vent Outcome: Completed/Met Date Met:  03/02/14 03/02/14 Goal: Time pt extubated/weaned off vent Outcome: Completed/Met Date Met:  03/02/14 1030am Goal: Hemodynamically stable Outcome: Completed/Met Date Met:  03/02/14 Goal: Pain controlled Outcome: Completed/Met Date Met:  03/02/14

## 2014-03-03 ENCOUNTER — Inpatient Hospital Stay (HOSPITAL_COMMUNITY): Payer: BC Managed Care – PPO

## 2014-03-03 DIAGNOSIS — R64 Cachexia: Secondary | ICD-10-CM

## 2014-03-03 DIAGNOSIS — D72829 Elevated white blood cell count, unspecified: Secondary | ICD-10-CM

## 2014-03-03 LAB — GLUCOSE, CAPILLARY
GLUCOSE-CAPILLARY: 154 mg/dL — AB (ref 70–99)
GLUCOSE-CAPILLARY: 167 mg/dL — AB (ref 70–99)
Glucose-Capillary: 174 mg/dL — ABNORMAL HIGH (ref 70–99)
Glucose-Capillary: 104 mg/dL — ABNORMAL HIGH (ref 70–99)

## 2014-03-03 LAB — CBC
HEMATOCRIT: 37.3 % — AB (ref 39.0–52.0)
Hemoglobin: 12.4 g/dL — ABNORMAL LOW (ref 13.0–17.0)
MCH: 30.2 pg (ref 26.0–34.0)
MCHC: 33.2 g/dL (ref 30.0–36.0)
MCV: 91 fL (ref 78.0–100.0)
PLATELETS: 186 10*3/uL (ref 150–400)
RBC: 4.1 MIL/uL — ABNORMAL LOW (ref 4.22–5.81)
RDW: 13 % (ref 11.5–15.5)
WBC: 20.7 10*3/uL — AB (ref 4.0–10.5)

## 2014-03-03 LAB — BASIC METABOLIC PANEL
Anion gap: 16 — ABNORMAL HIGH (ref 5–15)
BUN: 22 mg/dL (ref 6–23)
CALCIUM: 8.9 mg/dL (ref 8.4–10.5)
CO2: 24 meq/L (ref 19–32)
Chloride: 103 mEq/L (ref 96–112)
Creatinine, Ser: 0.77 mg/dL (ref 0.50–1.35)
Glucose, Bld: 173 mg/dL — ABNORMAL HIGH (ref 70–99)
Potassium: 3.6 mEq/L — ABNORMAL LOW (ref 3.7–5.3)
SODIUM: 143 meq/L (ref 137–147)

## 2014-03-03 LAB — RESPIRATORY VIRUS PANEL
Adenovirus: NOT DETECTED
INFLUENZA A H1: NOT DETECTED
INFLUENZA A H3: NOT DETECTED
Influenza A: NOT DETECTED
Influenza B: NOT DETECTED
METAPNEUMOVIRUS: NOT DETECTED
PARAINFLUENZA 1 A: NOT DETECTED
Parainfluenza 2: NOT DETECTED
Parainfluenza 3: NOT DETECTED
RESPIRATORY SYNCYTIAL VIRUS A: NOT DETECTED
RHINOVIRUS: NOT DETECTED
Respiratory Syncytial Virus B: NOT DETECTED

## 2014-03-03 LAB — CULTURE, BLOOD (ROUTINE X 2)

## 2014-03-03 LAB — HEPATITIS PANEL, ACUTE
HCV Ab: NEGATIVE
HEP B C IGM: NONREACTIVE
Hep A IgM: NONREACTIVE
Hepatitis B Surface Ag: NEGATIVE

## 2014-03-03 LAB — PROCALCITONIN: Procalcitonin: 21.67 ng/mL

## 2014-03-03 MED ORDER — LISINOPRIL 2.5 MG PO TABS
2.5000 mg | ORAL_TABLET | Freq: Every day | ORAL | Status: DC
  Administered 2014-03-03 – 2014-03-08 (×6): 2.5 mg via ORAL
  Filled 2014-03-03 (×6): qty 1

## 2014-03-03 MED ORDER — OXYCODONE HCL 5 MG PO TABS
ORAL_TABLET | ORAL | Status: AC
  Filled 2014-03-03: qty 1

## 2014-03-03 MED ORDER — METOPROLOL TARTRATE 25 MG PO TABS
25.0000 mg | ORAL_TABLET | Freq: Two times a day (BID) | ORAL | Status: DC
  Administered 2014-03-03 – 2014-03-08 (×11): 25 mg via ORAL
  Filled 2014-03-03 (×12): qty 1

## 2014-03-03 MED ORDER — HYDRALAZINE HCL 10 MG PO TABS
10.0000 mg | ORAL_TABLET | Freq: Three times a day (TID) | ORAL | Status: DC
  Administered 2014-03-03: 10 mg via ORAL
  Filled 2014-03-03 (×3): qty 1

## 2014-03-03 MED ORDER — INSULIN ASPART 100 UNIT/ML ~~LOC~~ SOLN
4.0000 [IU] | Freq: Three times a day (TID) | SUBCUTANEOUS | Status: DC
  Administered 2014-03-03 – 2014-03-05 (×5): 4 [IU] via SUBCUTANEOUS

## 2014-03-03 MED ORDER — ASPIRIN EC 81 MG PO TBEC
81.0000 mg | DELAYED_RELEASE_TABLET | Freq: Every day | ORAL | Status: DC
  Administered 2014-03-03 – 2014-03-04 (×2): 81 mg via ORAL
  Filled 2014-03-03 (×2): qty 1

## 2014-03-03 MED ORDER — INSULIN ASPART 100 UNIT/ML ~~LOC~~ SOLN: 0.0000 [IU] | Freq: Every day | SUBCUTANEOUS | Status: DC

## 2014-03-03 MED ORDER — INSULIN GLARGINE 100 UNIT/ML ~~LOC~~ SOLN
20.0000 [IU] | Freq: Every day | SUBCUTANEOUS | Status: DC
  Administered 2014-03-03 – 2014-03-04 (×2): 20 [IU] via SUBCUTANEOUS
  Filled 2014-03-03 (×3): qty 0.2

## 2014-03-03 MED ORDER — SODIUM CHLORIDE 0.9 % IV SOLN
2.0000 g | INTRAVENOUS | Status: DC
  Administered 2014-03-03 – 2014-03-08 (×30): 2 g via INTRAVENOUS
  Filled 2014-03-03 (×36): qty 2000

## 2014-03-03 MED ORDER — INSULIN ASPART 100 UNIT/ML ~~LOC~~ SOLN
0.0000 [IU] | Freq: Three times a day (TID) | SUBCUTANEOUS | Status: DC
  Administered 2014-03-03: 3 [IU] via SUBCUTANEOUS
  Administered 2014-03-04: 5 [IU] via SUBCUTANEOUS
  Administered 2014-03-04: 2 [IU] via SUBCUTANEOUS
  Administered 2014-03-04 – 2014-03-05 (×2): 3 [IU] via SUBCUTANEOUS
  Administered 2014-03-05 (×2): 5 [IU] via SUBCUTANEOUS
  Administered 2014-03-06 (×2): 3 [IU] via SUBCUTANEOUS
  Administered 2014-03-06 – 2014-03-07 (×4): 2 [IU] via SUBCUTANEOUS
  Administered 2014-03-08: 3 [IU] via SUBCUTANEOUS

## 2014-03-03 MED ORDER — ATORVASTATIN CALCIUM 10 MG PO TABS
10.0000 mg | ORAL_TABLET | Freq: Every day | ORAL | Status: DC
  Administered 2014-03-03 – 2014-03-07 (×5): 10 mg via ORAL
  Filled 2014-03-03 (×8): qty 1

## 2014-03-03 MED ORDER — OXYCODONE HCL 5 MG PO TABS
5.0000 mg | ORAL_TABLET | ORAL | Status: DC | PRN
  Administered 2014-03-03 – 2014-03-05 (×7): 5 mg via ORAL
  Filled 2014-03-03 (×4): qty 1

## 2014-03-03 MED ORDER — ACETAMINOPHEN 325 MG PO TABS
650.0000 mg | ORAL_TABLET | Freq: Four times a day (QID) | ORAL | Status: DC | PRN
  Filled 2014-03-03: qty 2

## 2014-03-03 NOTE — Progress Notes (Signed)
Suggested to family aout getting patient up and OOB to either the side of the bed or to the chair and wife continues to be addiment about patient discomfort when sitting in upright position and how he has not been out of bed for numerous days due to weakness. Explained benefits but still pretty resistant. Also, called to give report to [id number] RN and had to leave [employer] number for her to call back. Richardean Sale, RN

## 2014-03-03 NOTE — Progress Notes (Addendum)
PULMONARY / CRITICAL CARE MEDICINE   Name: [name] MRN: [id number] DOB: [birth year]    ADMISSION DATE:  02/26/2014 CONSULTATION DATE:  03/01/14  REFERRING MD :  Dr. Wynelle Cleveland   CHIEF COMPLAINT:  Sepsis, Fever, AMS  INITIAL PRESENTATION: 48 y/o M with PMH of HTN, HLD, DM, scoliosis and recent removal of multiple lipomas (~30) and poor wound healing who was admitted on 11/29 with c/o's intractable low back pain.  SIGNIFICANT EVENTS/STUDIES:  11/29  Admit to [facility] service with back pain  11/30  MRI Lumbar Spine:  No acute abnormality or abnormal enhancement within the lumbar spine. Dextroscoliosis of the lumbar spine with apex at L2. Degenerative disc disease with superimposed right foraminal disc protrusion at L3-4, abutting the exiting right L3 nerve root as it courses out of the right neural foramen. This could potentially  result in nerve root irritation. There is moderate to severe right with mild left foraminal narrowing at this level. Mild degenerative disc disease and facet arthrosis at L4-5 with resultant moderate right and mild left foraminal narrowing 12/02 ID consult  12/02  MRI Brain:  Multiple small areas of acute infarct bilaterally, chronic micro-hemorrhage in L external capsule.   12/02  Very high fever, seizures, decompensated requiring emergent intubation 12/02 CT head: No acute intracranial pathology seen on CT. Mild small vessel ischemic microangiopathy 12/02 TTE: There is a large echodensity measuring 1.4 x 2 cm attached to the posterior leaflet of the mitral valve. Anterior lealet is mildly thickened at the tip. There are mutiple jets of mitral regurgitation with at least moderate regurgitation 12/02 TEE: A large echodensity measuring 1.4 x 2 cm is attached to the posterior leaflet of the mitral valve suspicious for endocarditis. It affects predominantly the P2 scallop but extends partially into P1 and P3 scallops. There is translucent area in the middle of the  posterior leaflet suspicious of an abscess formation. No obvious perforation. Anterior lealet is mildly thickened at the tip. There are multiple jets of mitral regurgitation with at least moderate regurgitation. There is systolic flow reversal in the right upper pulmonary vein (not left upper PV). 12/02 Neurology consult: empiric treatment for meningitis. Keppra. EEG ordered 1202 EEG: consistent with a generalized non-specific cerebral dysfunction(encephalopathy). There was no seizure or seizure predisposition recorded on this study 12/02 TCTS consult: Patient needs aggressive antibiotic therapy and critical care support and follow-up echocardiogram as we progress with therapy. Should the patient develop increasing regurgitation with congestive heart failure valve dehiscence of perforation or persistent fever and bacteremia with optimal medical treatment or vegetations increasing in size in spite of antimicrobial therapy would then consider mitral valve repair or replacement.   12/03 passed SBT. Able to F/C despite fentanyl infusion. Extubated and tolerating   INDWELLING DEVICES:: ETT 12/02 >> 12/03  MICRO DATA: 12/01 Malaria smear >> NEG 12/01 blood >> amp sens enterococcus 12/02 Urine >> NEG 12/02 RVP >>  12/02 blood >>   ANTIMICROBIALS (per ID):  Vanc 12/02 >> 12/04 Ceftriaxone 12/02 >>  Ampicillin 12/04 >>   SUBJECTIVE:  RASS 0. Some difficulty with word finding. Thought content seems appropriate. No distress  VITAL SIGNS: Temp:  [97.4 F (36.3 C)-98.1 F (36.7 C)] 97.6 F (36.4 C) (12/04 1200) Pulse Rate:  [62-99] 66 (12/04 0600) Resp:  [12-31] 16 (12/04 0600) BP: (112-128)/(69-83) 123/77 mmHg (12/04 0600) SpO2:  [95 %-99 %] 97 % (12/04 0600)   HEMODYNAMICS:     VENTILATOR SETTINGS:     INTAKE / OUTPUT:  Intake/Output  Summary (Last 24 hours) at 03/03/14 1309 Last data filed at 03/03/14 1000  Gross per 24 hour  Intake    600 ml  Output   3345 ml  Net  -2745 ml     PHYSICAL EXAMINATION: General:  NAD  Neuro:  No focal deficits  HEENT: WNL, PERRL Cardiovascular: Reg, + holosyst M Lungs:  Clear anteriorly Abdomen: soft, +BS  Ext: warm, no edema, no stigmata of BE Skin:  Multiple lipomas, scars on upper extremities  LABS: I have reviewed all of today's lab results. Relevant abnormalities are discussed in the A/P section  CXR: [place] CM, mild IS prominence  ASSESSMENT / PLAN:  PULMONARY  A: Acute Respiratory Failure, resolved Very mild pulm edema P:   Cont supp O2 to maintain SpO2 > 92%  CARDIOVASCULAR A:  Mitral valve endocarditis Moderate mitral regurgitation LV dysfunction - EF 30-35% Sinus tachycardia H/O HTN, HLD P:  Cont telemetry Metoprolol changed to PO Hydralazine changed to PO Cards and TCTS following Will need LHC @ some point Will need MV replacement @ some point  RENAL A:   Hx Wilms Tumor, Solitary Kidney P:   Monitor BMET intermittently Monitor I/Os Correct electrolytes as indicated Avoid nephrotoxins  GASTROINTESTINAL A:   Post extubation dysphagia, resolved P:   SUP: N/I post extubation Advance diet to carb mod/heart healthy 12/04  HEMATOLOGIC A:   Mild Anemia without acute blood loss High risk of hemorrhagic transformation of CNS emboli P:  DVT px: SCDs Monitor CBC intermittently Transfuse per usual ICU guidelines Avoid anticoagulation  INFECTIOUS A:   Enterococcus bacteremia Mitral valve endocarditis Severe sepsis, resolving P:   Micro and abx as above ID service overseeing antimicrobial therapy  ENDOCRINE A:   DM 2 - improved control off systemic steroids P:   Cont Lantus - dose adjusted Change SSI to ACHS as diet started 12/04  NEUROLOGIC A:   Acute Encephalopathy, improving Multiple septic emboli to brain ? Seizure 12/02 - likely induced by very high fever Severe back pain - presenting symptom. Concern for early epidural or paraspinal abscess not seen on original MRI  Hx  Scoliosis  P:   Analgesic regimen adjusted Neuro following   Pt, his wife and his parents updated @ bedside Transfer to tele 12/04 TRH to resume care as of AM 12/05 and PCCM to sign off   Merton Border, MD ; [facility] [phone].  After 5:30 PM or weekends, call [phone]

## 2014-03-03 NOTE — Progress Notes (Addendum)
INFECTIOUS DISEASE PROGRESS NOTE  ID: [name] is a 48 y.o. male with  Principal Problem:   Change in mental status Active Problems:   HYPERTENSION, BENIGN   Family history of ischemic heart disease   Intractable back pain   Hyperglycemia   DM (diabetes mellitus), type 2, uncontrolled   Hyperlipidemia   Gram-positive bacteremia   Severe sepsis   Acute endocarditis   Cardiomyopathy- EF 30-35%   CVA (cerebral infarction)- embolic   Acute respiratory failure   Cerebral septic emboli   Acute bacterial endocarditis  Subjective: Without complaints.  Further hx from father- pt had lipoma surgery in September with wound infection.  Has hx of solitary kidney after surgery for Wilm's tumor as a child.   Abtx:  Anti-infectives    Start     Dose/Rate Route Frequency Ordered Stop   03/03/14 1200  ampicillin (OMNIPEN) 2 g in sodium chloride 0.9 % 50 mL IVPB     2 g150 mL/hr over 20 Minutes Intravenous 6 times per day 03/03/14 0842     03/02/14 2000  vancomycin (VANCOCIN) 1,250 mg in sodium chloride 0.9 % 250 mL IVPB  Status:  Discontinued     1,250 mg166.7 mL/hr over 90 Minutes Intravenous Every 8 hours 03/02/14 1344 03/03/14 0842   03/01/14 1000  cefTRIAXone (ROCEPHIN) 2 g in dextrose 5 % 50 mL IVPB - Premix     2 g100 mL/hr over 30 Minutes Intravenous Every 12 hours 03/01/14 0907     03/01/14 0500  vancomycin (VANCOCIN) IVPB 1000 mg/200 mL premix  Status:  Discontinued     1,000 mg200 mL/hr over 60 Minutes Intravenous Every 8 hours 03/01/14 0301 03/02/14 1344   03/01/14 0400  cefTAZidime (FORTAZ) 2 g in dextrose 5 % 50 mL IVPB  Status:  Discontinued     2 g100 mL/hr over 30 Minutes Intravenous 3 times per day 03/01/14 0301 03/01/14 0907      Medications:  Scheduled: . ampicillin (OMNIPEN) IV  2 g Intravenous 6 times per day  . aspirin EC  81 mg Oral Daily  . atorvastatin  10 mg Oral q1800  . cefTRIAXone (ROCEPHIN)  IV  2 g Intravenous Q12H  . hydrALAZINE  10 mg Oral 3  times per day  . insulin aspart  0-15 Units Subcutaneous TID WC  . insulin aspart  0-5 Units Subcutaneous QHS  . insulin aspart  4 Units Subcutaneous TID WC  . insulin glargine  20 Units Subcutaneous QHS  . metoprolol tartrate  25 mg Oral BID    Objective: Vital signs in last 24 hours: Temp:  [97.4 F (36.3 C)-98.1 F (36.7 C)] 97.4 F (36.3 C) (12/04 0800) Pulse Rate:  [62-100] 66 (12/04 0600) Resp:  [12-31] 16 (12/04 0600) BP: (112-128)/(69-83) 123/77 mmHg (12/04 0600) SpO2:  [94 %-99 %] 97 % (12/04 0600)   General appearance: alert, cooperative and no distress Eyes: negative findings: EOMI Resp: clear to auscultation bilaterally Cardio: regular rate and rhythm GI: normal findings: bowel sounds normal and soft, non-tender Extremities: edema none  Lab Results  Recent Labs  03/02/14 0353 03/03/14 0229  WBC 21.4* 20.7*  HGB 11.3* 12.4*  HCT 33.6* 37.3*  NA 138 143  K 4.4 3.6*  CL 101 103  CO2 22 24  BUN 21 22  CREATININE 0.86 0.77   Liver Panel  Recent Labs  03/01/14 0910  PROT 6.7  ALBUMIN 2.4*  AST 25  ALT 34  ALKPHOS 93  BILITOT 0.9  Sedimentation Rate  Recent Labs  02/28/14 1734  ESRSEDRATE 56*   C-Reactive Protein  Recent Labs  02/28/14 1734  CRP 20.2*    Microbiology: Recent Results (from the past 240 hour(s))  Malaria smear     Status: None   Collection Time: 02/28/14  3:55 AM  Result Value Ref Range Status   Specimen Description BLOOD  Final   Special Requests Normal  Final   Malaria Prep   Final    No Plasmodium or Other Blood Parasites Seen on Thick or Thin Smears For persons strongly suspected of having a blood parasite,but have negative smears, it is recommended that blood films be repeated approximately every 12 to 24 hours for 3 consecutive  days. Performed at [facility]    Report Status 03/01/2014 FINAL  Final  Culture, blood (routine x 2)     Status: None   Collection Time: 02/28/14  9:10 AM  Result Value  Ref Range Status   Specimen Description BLOOD LEFT HAND  Final   Special Requests BOTTLES DRAWN AEROBIC ONLY 5CC  Final   Culture  Setup Time   Final    02/28/2014 13:28 Performed at [facility]    Culture   Final    ENTEROCOCCUS SPECIES Note: SUSCEPTIBILITIES PERFORMED ON PREVIOUS CULTURE WITHIN THE LAST 5 DAYS. Note: Gram Stain Report Called to,Read Back By and Verified With:  CAROL MERRITT @0310  03/01/14 SMIAS Performed at [facility]    Report Status 03/03/2014 FINAL  Final  Culture, blood (routine x 2)     Status: None   Collection Time: 02/28/14  9:10 AM  Result Value Ref Range Status   Specimen Description BLOOD LEFT ARM  Final   Special Requests BOTTLES DRAWN AEROBIC AND ANAEROBIC 5CC  Final   Culture  Setup Time   Final    02/28/2014 13:28 Performed at [facility]    Culture   Final    ENTEROCOCCUS SPECIES Note: Gram Stain Report Called to,Read Back By and Verified With: MONTRESSA @0204  03/01/14 SMIAS Performed at [facility]    Report Status 03/03/2014 FINAL  Final   Organism ID, Bacteria ENTEROCOCCUS SPECIES  Final      Susceptibility   Enterococcus species - MIC*    AMPICILLIN <=2 SENSITIVE Sensitive     VANCOMYCIN 2 SENSITIVE Sensitive     * ENTEROCOCCUS SPECIES  Culture, Urine     Status: None   Collection Time: 03/01/14  5:06 AM  Result Value Ref Range Status   Specimen Description URINE, CATHETERIZED  Final   Special Requests Normal  Final   Culture  Setup Time   Final    03/01/2014 08:37 Performed at [place] Performed at [facility]   Final   Culture NO GROWTH Performed at [facility]   Final   Report Status 03/02/2014 FINAL  Final  MRSA PCR Screening     Status: None   Collection Time: 03/01/14  5:15 AM  Result Value Ref Range Status   MRSA by PCR NEGATIVE NEGATIVE Final    Comment:        The GeneXpert MRSA Assay (FDA approved for NASAL  specimens only), is one component of a comprehensive MRSA colonization surveillance program. It is not intended to diagnose MRSA infection nor to guide or monitor treatment for MRSA infections.   Culture, blood (routine x 2)     Status: None (Preliminary result)  Collection Time: 03/02/14 12:06 PM  Result Value Ref Range Status   Specimen Description BLOOD RIGHT ARM  Final   Special Requests BOTTLES DRAWN AEROBIC AND ANAEROBIC 10CC  Final   Culture  Setup Time   Final    03/02/2014 16:52 Performed at [facility]    Culture   Final           BLOOD CULTURE RECEIVED NO GROWTH TO DATE CULTURE WILL BE HELD FOR 5 DAYS BEFORE ISSUING A FINAL NEGATIVE REPORT Performed at [facility]    Report Status PENDING  Incomplete    Studies/Results: Dg Chest Port 1 View  03/03/2014   CLINICAL DATA:  Respiratory failure  EXAM: PORTABLE CHEST - 1 VIEW  COMPARISON:  12/3/fifth  FINDINGS: Cardiomediastinal silhouette is stable. Endotracheal and NG tube has been removed. Residual mild interstitial prominence bilateral suspicious for residual mild interstitial edema. No segmental infiltrate. No pneumothorax.  IMPRESSION: NG tube and endotracheal tube has been removed. Residual mild interstitial prominence suspicious for mild residual interstitial edema. No segmental infiltrate. No pneumothorax.   Electronically Signed   By: Lahoma Crocker M.D.   On: 03/03/2014 08:52   Dg Chest Port 1 View  03/02/2014   CLINICAL DATA:  Follow-up of respiratory failure  EXAM: PORTABLE CHEST - 1 VIEW  COMPARISON:  Portable chest x-rays of March 01, 2014.  FINDINGS: The lungs are well-expanded. There is no focal infiltrate. The interstitial markings remain minimally prominent. The cardiac silhouette is top-normal in size. The pulmonary vascularity is less engorged today. There is no pleural effusion or pneumothorax.  The endotracheal tube tip lies 4.2 cm above the crotch of the carina. The esophagogastric tube tip  projects below the expected location of the GE junction.  IMPRESSION: Further improvement in the appearance of the pulmonary interstitium and central pulmonary vascularity is consistent with ongoing resolution of pulmonary interstitial edema. There is no evidence of pneumonia. The support tubes and lines are in appropriate position.   Electronically Signed   By: David  [place]   On: 03/02/2014 07:45     Assessment/Plan: Enterococcal Bacteremia Spetic Emboli, CNS MV IE EF 30-35% Seizure Mental status change/encephalopathy DM2 uncontrolled Leukocytosis (steroids) Back pain Solitary kidney Recent wound infection  Total days of antibiotics: 3 amp/ceftriaxone  Greatly appreciate pharm assistance, CCM, Neuro Will ask lab to check gent sensi on his isolate (it is). He is young and his Cr is normal however he has solitary kidney so will not risk nephrotoxicity. CVTS eval? Will write for pic, he will need long term anbx MRI of L spine was w and w/o, no acute change. Will monitor.  Explained to pt, father          Bobby Rumpf Infectious Diseases (pager) [phone] [email or web address] 03/03/2014, 10:46 AM  LOS: 5 days

## 2014-03-03 NOTE — Progress Notes (Addendum)
Subjective: Patient laying comfortably  No SOB   Objective: Filed Vitals:   03/03/14 1100 03/03/14 1200 03/03/14 1300 03/03/14 1400  BP: 128/86 130/85 129/78   Pulse: 82 74    Temp:  97.6 F (36.4 C)    TempSrc:  Oral    Resp: 17 13 20 16   Height:      Weight:      SpO2: 98% 96%     Weight change:   Intake/Output Summary (Last 24 hours) at 03/03/14 1440 Last data filed at 03/03/14 1400  Gross per 24 hour  Intake   1130 ml  Output   3330 ml  Net  -2200 ml    General: Alert, awake, oriented x3, in no acute distress Neck:  JVP is normal Heart: Regular rate and rhythm, II/VI systolic murmur, rubs, gallops.  Lungs: Clear to auscultationanteriorly    No rales or wheezes. Exemities:  No edema.   Neuro: Grossly intact, nonfocal.  Tele:  SR    Lab Results: Results for orders placed or performed during the hospital encounter of 02/26/14 (from the past 24 hour(s))  Glucose, capillary     Status: Abnormal   Collection Time: 03/02/14  3:21 PM  Result Value Ref Range   Glucose-Capillary 334 (H) 70 - 99 mg/dL   Comment 1 Capillary Sample    Comment 2 Notify RN   Glucose, capillary     Status: Abnormal   Collection Time: 03/02/14  7:25 PM  Result Value Ref Range   Glucose-Capillary 216 (H) 70 - 99 mg/dL   Comment 1 Capillary Sample    Comment 2 Documented in Chart    Comment 3 Notify RN   Glucose, capillary     Status: Abnormal   Collection Time: 03/02/14 11:49 PM  Result Value Ref Range   Glucose-Capillary 200 (H) 70 - 99 mg/dL   Comment 1 Capillary Sample    Comment 2 Documented in Chart    Comment 3 Notify RN   Basic metabolic panel     Status: Abnormal   Collection Time: 03/03/14  2:29 AM  Result Value Ref Range   Sodium 143 137 - 147 mEq/L   Potassium 3.6 (L) 3.7 - 5.3 mEq/L   Chloride 103 96 - 112 mEq/L   CO2 24 19 - 32 mEq/L   Glucose, Bld 173 (H) 70 - 99 mg/dL   BUN 22 6 - 23 mg/dL   Creatinine, Ser 0.77 0.50 - 1.35 mg/dL   Calcium 8.9 8.4 - 10.5 mg/dL   GFR calc non Af Amer >90 >90 mL/min   GFR calc Af Amer >90 >90 mL/min   Anion gap 16 (H) 5 - 15  CBC     Status: Abnormal   Collection Time: 03/03/14  2:29 AM  Result Value Ref Range   WBC 20.7 (H) 4.0 - 10.5 K/uL   RBC 4.10 (L) 4.22 - 5.81 MIL/uL   Hemoglobin 12.4 (L) 13.0 - 17.0 g/dL   HCT 37.3 (L) 39.0 - 52.0 %   MCV 91.0 78.0 - 100.0 fL   MCH 30.2 26.0 - 34.0 pg   MCHC 33.2 30.0 - 36.0 g/dL   RDW 13.0 11.5 - 15.5 %   Platelets 186 150 - 400 K/uL  Procalcitonin     Status: None   Collection Time: 03/03/14  2:29 AM  Result Value Ref Range   Procalcitonin 21.67 ng/mL  Glucose, capillary     Status: Abnormal   Collection Time: 03/03/14  3:51 AM  Result Value Ref Range   Glucose-Capillary 174 (H) 70 - 99 mg/dL   Comment 1 Capillary Sample    Comment 2 Notify RN    Comment 3 Documented in Chart   Glucose, capillary     Status: Abnormal   Collection Time: 03/03/14 11:34 AM  Result Value Ref Range   Glucose-Capillary 154 (H) 70 - 99 mg/dL   Comment 1 Capillary Sample     Studies/Results: Dg Chest Port 1 View  03/02/2014   CLINICAL DATA:  Follow-up of respiratory failure  EXAM: PORTABLE CHEST - 1 VIEW  COMPARISON:  Portable chest x-rays of March 01, 2014.  FINDINGS: The lungs are well-expanded. There is no focal infiltrate. The interstitial markings remain minimally prominent. The cardiac silhouette is top-normal in size. The pulmonary vascularity is less engorged today. There is no pleural effusion or pneumothorax.  The endotracheal tube tip lies 4.2 cm above the crotch of the carina. The esophagogastric tube tip projects below the expected location of the GE junction.  IMPRESSION: Further improvement in the appearance of the pulmonary interstitium and central pulmonary vascularity is consistent with ongoing resolution of pulmonary interstitial edema. There is no evidence of pneumonia. The support tubes and lines are in appropriate position.   Electronically Signed   By: David   [place]   On: 03/02/2014 07:45    Medications: Reivewed   @PROBHOSP @  1  Mitral valve endocarditis.   Patient comfortable  Tolerating meds  BP and HR are good  On ABX Volume appears OK  Would switch hydralazine to ACE I for afterload reduction.  Continue b blocker  Keep for now on metoprolol At some point switch to coreg but want to follow bp closely.    LOS: 5 days   Dorris Carnes 03/03/2014, 2:40 PM

## 2014-03-03 NOTE — Progress Notes (Signed)
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  No Known Allergies  Patient Measurements: Height: 6' (182.9 cm) Weight: 205 lb 11 oz (93.3 kg) IBW/kg (Calculated) : 77.6  Vital Signs: Temp: 97.4 F (36.3 C) (12/04 0800) Temp Source: Oral (12/04 0800) BP: 123/77 mmHg (12/04 0600) Pulse Rate: 66 (12/04 0600) Intake/Output from previous day: 12/03 0701 - 12/04 0700 In: 1410 [P.O.:60; I.V.:410; NG/GT:140; IV Piggyback:800] Out: 3545 [Urine:3545] Intake/Output from this shift:    Labs:  Recent Labs  03/01/14 0910 03/02/14 0353 03/03/14 0229  WBC 21.7* 21.4* 20.7*  HGB 12.3* 11.3* 12.4*  PLT 168 175 186  CREATININE 1.07 0.86 0.77   Estimated Creatinine Clearance: 136.9 mL/min (by C-G formula based on Cr of 0.77).  Recent Labs  03/02/14 1206  Brandon 14.2     Microbiology: Recent Results (from the past 720 hour(s))  Malaria smear     Status: None   Collection Time: 02/28/14  3:55 AM  Result Value Ref Range Status   Specimen Description BLOOD  Final   Special Requests Normal  Final   Malaria Prep   Final    No Plasmodium or Other Blood Parasites Seen on Thick or Thin Smears For persons strongly suspected of having a blood parasite,but have negative smears, it is recommended that blood films be repeated approximately every 12 to 24 hours for 3 consecutive  days. Performed at [facility]    Report Status 03/01/2014 FINAL  Final  Culture, blood (routine x 2)     Status: None   Collection Time: 02/28/14  9:10 AM  Result Value Ref Range Status   Specimen Description BLOOD LEFT HAND  Final   Special Requests BOTTLES DRAWN AEROBIC ONLY 5CC  Final   Culture  Setup Time   Final    02/28/2014 13:28 Performed at [facility]    Culture   Final    ENTEROCOCCUS SPECIES Note: SUSCEPTIBILITIES PERFORMED ON PREVIOUS CULTURE WITHIN THE LAST 5 DAYS. Note:  Gram Stain Report Called to,Read Back By and Verified With:  CAROL MERRITT @0310  03/01/14 SMIAS Performed at [facility]    Report Status 03/03/2014 FINAL  Final  Culture, blood (routine x 2)     Status: None   Collection Time: 02/28/14  9:10 AM  Result Value Ref Range Status   Specimen Description BLOOD LEFT ARM  Final   Special Requests BOTTLES DRAWN AEROBIC AND ANAEROBIC 5CC  Final   Culture  Setup Time   Final    02/28/2014 13:28 Performed at [facility]    Culture   Final    ENTEROCOCCUS SPECIES Note: Gram Stain Report Called to,Read Back By and Verified With: MONTRESSA @0204  03/01/14 SMIAS Performed at [facility]    Report Status 03/03/2014 FINAL  Final   Organism ID, Bacteria ENTEROCOCCUS SPECIES  Final      Susceptibility   Enterococcus species - MIC*    AMPICILLIN <=2 SENSITIVE Sensitive     VANCOMYCIN 2 SENSITIVE Sensitive     * ENTEROCOCCUS SPECIES  Culture, Urine     Status: None   Collection Time: 03/01/14  5:06 AM  Result Value Ref Range Status   Specimen Description URINE, CATHETERIZED  Final   Special Requests Normal  Final   Culture  Setup Time   Final    03/01/2014 08:37 Performed at [place]  Performed at [facility]   Final   Culture NO GROWTH Performed at [facility]   Final   Report Status 03/02/2014 FINAL  Final  MRSA PCR Screening     Status: None   Collection Time: 03/01/14  5:15 AM  Result Value Ref Range Status   MRSA by PCR NEGATIVE NEGATIVE Final    Comment:        The GeneXpert MRSA Assay (FDA approved for NASAL specimens only), is one component of a comprehensive MRSA colonization surveillance program. It is not intended to diagnose MRSA infection nor to guide or monitor treatment for MRSA infections.   Culture, blood (routine x 2)     Status: None (Preliminary result)   Collection Time: 03/02/14 12:06 PM  Result Value Ref Range Status   Specimen  Description BLOOD RIGHT ARM  Final   Special Requests BOTTLES DRAWN AEROBIC AND ANAEROBIC 10CC  Final   Culture  Setup Time   Final    03/02/2014 16:52 Performed at [facility]    Culture   Final           BLOOD CULTURE RECEIVED NO GROWTH TO DATE CULTURE WILL BE HELD FOR 5 DAYS BEFORE ISSUING A FINAL NEGATIVE REPORT Performed at [facility]    Report Status PENDING  Incomplete    Anti-infectives    Start     Dose/Rate Route Frequency Ordered Stop   03/03/14 1200  ampicillin (OMNIPEN) 2 g in sodium chloride 0.9 % 50 mL IVPB     2 g150 mL/hr over 20 Minutes Intravenous 6 times per day 03/03/14 0842     03/02/14 2000  vancomycin (VANCOCIN) 1,250 mg in sodium chloride 0.9 % 250 mL IVPB  Status:  Discontinued     1,250 mg166.7 mL/hr over 90 Minutes Intravenous Every 8 hours 03/02/14 1344 03/03/14 0842   03/01/14 1000  cefTRIAXone (ROCEPHIN) 2 g in dextrose 5 % 50 mL IVPB - Premix     2 g100 mL/hr over 30 Minutes Intravenous Every 12 hours 03/01/14 0907     03/01/14 0500  vancomycin (VANCOCIN) IVPB 1000 mg/200 mL premix  Status:  Discontinued     1,000 mg200 mL/hr over 60 Minutes Intravenous Every 8 hours 03/01/14 0301 03/02/14 1344   03/01/14 0400  cefTAZidime (FORTAZ) 2 g in dextrose 5 % 50 mL IVPB  Status:  Discontinued     2 g100 mL/hr over 30 Minutes Intravenous 3 times per day 03/01/14 0301 03/01/14 0907      Assessment: 33 YOM who presented on 11/30 with back pain, rigors, high fever, and night sweats for 5-6 weeks. 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 Will continue high-dose Rocephin for synergistic effects.  Goal of Therapy:  Proper antibiotics for infection/cultures adjusted for renal/hepatic function   Plan:  1. Start Ampicillin 2g IV every 4 hours 2. Continue Rocephin 2g IV every  12 hours 3. Will continue to follow renal function, culture results, and stop dates for appropriate LOT  Alycia Rossetti, PharmD, BCPS Clinical Pharmacist Pager: [phone] 03/03/2014 9:03 AM

## 2014-03-03 NOTE — Plan of Care (Signed)
Problem: Phase II Progression Outcomes Goal: Vital signs remain stable Outcome: Completed/Met Date Met:  03/03/14 Goal: IV changed to normal saline lock Outcome: Completed/Met Date Met:  03/03/14

## 2014-03-04 ENCOUNTER — Inpatient Hospital Stay (HOSPITAL_COMMUNITY): Payer: BC Managed Care – PPO

## 2014-03-04 DIAGNOSIS — M545 Low back pain: Secondary | ICD-10-CM

## 2014-03-04 LAB — GLUCOSE, CAPILLARY
GLUCOSE-CAPILLARY: 144 mg/dL — AB (ref 70–99)
GLUCOSE-CAPILLARY: 151 mg/dL — AB (ref 70–99)
Glucose-Capillary: 176 mg/dL — ABNORMAL HIGH (ref 70–99)
Glucose-Capillary: 208 mg/dL — ABNORMAL HIGH (ref 70–99)
Glucose-Capillary: 165 mg/dL — ABNORMAL HIGH (ref 70–99)

## 2014-03-04 MED ORDER — LORAZEPAM 2 MG/ML IJ SOLN
2.0000 mg | Freq: Once | INTRAMUSCULAR | Status: AC
  Administered 2014-03-04: 2 mg via INTRAVENOUS
  Filled 2014-03-04: qty 1

## 2014-03-04 MED ORDER — CHLORHEXIDINE GLUCONATE 0.12 % MT SOLN
OROMUCOSAL | Status: AC
  Filled 2014-03-04: qty 15

## 2014-03-04 MED ORDER — SODIUM CHLORIDE 0.9 % IJ SOLN
10.0000 mL | Freq: Two times a day (BID) | INTRAMUSCULAR | Status: DC
  Administered 2014-03-04 – 2014-03-08 (×6): 10 mL

## 2014-03-04 MED ORDER — OXYCODONE HCL 5 MG PO TABS
ORAL_TABLET | ORAL | Status: AC
  Filled 2014-03-04: qty 1

## 2014-03-04 MED ORDER — SODIUM CHLORIDE 0.9 % IJ SOLN
10.0000 mL | INTRAMUSCULAR | Status: DC | PRN
  Administered 2014-03-04: 10 mL
  Filled 2014-03-04: qty 40

## 2014-03-04 MED ORDER — FLUTICASONE PROPIONATE HFA 110 MCG/ACT IN AERO
2.0000 | INHALATION_SPRAY | Freq: Two times a day (BID) | RESPIRATORY_TRACT | Status: DC
  Administered 2014-03-04 – 2014-03-08 (×7): 2 via RESPIRATORY_TRACT
  Filled 2014-03-04 (×2): qty 12

## 2014-03-04 MED ORDER — ENOXAPARIN SODIUM 40 MG/0.4ML ~~LOC~~ SOLN
40.0000 mg | SUBCUTANEOUS | Status: DC
  Filled 2014-03-04: qty 0.4

## 2014-03-04 NOTE — Progress Notes (Signed)
Peripherally Inserted Central Catheter/Midline Placement  The IV Nurse has discussed with the patient and/or persons authorized to consent for the patient, the purpose of this procedure and the potential benefits and risks involved with this procedure.  The benefits include less needle sticks, lab draws from the catheter and patient may be discharged home with the catheter.  Risks include, but not limited to, infection, bleeding, blood clot (thrombus formation), and puncture of an artery; nerve damage and irregular heat beat.  Alternatives to this procedure were also discussed.  PICC/Midline Placement Documentation  PICC / Midline Single Lumen 16/10/96 PICC Right Basilic 42 cm 0 cm (Active)  Indication for Insertion or Continuance of Line Home intravenous therapies (PICC only) 03/04/2014 11:05 AM  Exposed Catheter (cm) 0 cm 03/04/2014 11:05 AM  Site Assessment Clean;Dry;Intact 03/04/2014 11:05 AM  Line Status Flushed;Saline locked;Blood return noted 03/04/2014 11:05 AM  Dressing Type Transparent 03/04/2014 11:05 AM  Dressing Status Clean;Dry;Intact;Antimicrobial disc in place 03/04/2014 11:05 AM  Line Care Connections checked and tightened 03/04/2014 11:05 AM  Line Adjustment (NICU/IV Team Only) No 03/04/2014 11:05 AM  Dressing Intervention New dressing 03/04/2014 11:05 AM  Dressing Change Due 03/11/14 03/04/2014 11:05 AM       Rolena Infante 03/04/2014, 11:06 AM

## 2014-03-04 NOTE — Progress Notes (Signed)
Subjective:  Major complaint is back pain.  No complaints of shortness of breath or chest pain.  Objective:  Vital Signs in the last 24 hours: BP 124/80 mmHg  Pulse 67  Temp(Src) 97.9 F (36.6 C) (Oral)  Resp 19  Ht 6' (1.829 m)  Wt 93.3 kg (205 lb 11 oz)  BMI 27.89 kg/m2  SpO2 98%  Physical Exam: Pleasant male currently in no acute distress Lungs:  Clear  Cardiac:  Regular rhythm, normal S1 and S2, no S3, 1/6 systolic murmur Extremities:  No edema present  Intake/Output from previous day: 12/04 0701 - 12/05 0700 In: 830 [P.O.:680; IV Piggyback:150] Out: 1210 [Urine:1210] Weight Filed Weights   02/27/14 0957 03/01/14 1303 03/02/14 0500  Weight: 86.183 kg (190 lb) 87.3 kg (192 lb 7.4 oz) 93.3 kg (205 lb 11 oz)    Lab Results: Basic Metabolic Panel:  Recent Labs  03/02/14 0353 03/03/14 0229  NA 138 143  K 4.4 3.6*  CL 101 103  CO2 22 24  GLUCOSE 333* 173*  BUN 21 22  CREATININE 0.86 0.77    CBC:  Recent Labs  03/02/14 0353 03/03/14 0229  WBC 21.4* 20.7*  HGB 11.3* 12.4*  HCT 33.6* 37.3*  MCV 90.6 91.0  PLT 175 186   Telemetry: Sinus rhythm  Assessment/Plan:  1.  Mitral valve endocarditis with enterococcus. 2.  Prior history of cerebral septic emboli. 3.  Hypertension.  Recommendations:  Continue treatment for endocarditis.  He is hemodynamically stable and no need for urgent surgery at this time.  Repeat blood cultures are currently pending.     Kerry Hough  MD [facility] Cardiology  03/04/2014, 10:08 AM

## 2014-03-04 NOTE — Progress Notes (Signed)
Pt wife comes back to RN concerning pt inhaler, informed Respiratory will be contacted. respiratory informed and therapist said orders are still not in and needs order. Dr. Clementeen Graham paged; pt wife states she can't wait and she is going to her car to get pt his home inhaler. Respiratory Therapist informed and in to see pt; Charge Rn Kim informed. MD paged. Pt in bed comfortable with family at bedside. Will continue to monitor quietly. Francis Gaines Ariany Kesselman RN.

## 2014-03-04 NOTE — Progress Notes (Signed)
STROKE TEAM PROGRESS NOTE   HISTORY Much of history obtained from chart as patient is intubated and sedated.   [name] is an 48 y.o. male admitted by [name] on 02/26/14 after having a fever of 105.8 and c/o intractable back pain. Recent travel out of [place] so tests being run for that. Recent excision of cysts/lipomas with postop ? infection and was placed on 7days of Levaquin. However, he continued to have fever and chills at home and came to ED. ID has consulted and ordered various tests. No IV antibiotics were started given there was no focal etiology of infection. Over night he became tachycardic noted he was not feeling well, BC returned positive for gram positive cocci in chains and placed on ABX Temperature was noted to raise to 103.6. He was then noted to have a left gaze preference. Patient was brought to CT scan where he was noted to have a peroid of arms flexed and mild tremor--there was question of riggers versus seizure. On the floor he had another episode of generalized tremulous activity and "teeth chattering" which again was possibly riggers versus seizure. During this time he was not alert and only moaning while moving from bed to bed. He was intubated due to airway compromise. MRI showed embolic shower.   He also has been complaining of headaches over the past few days.   SUBJECTIVE (INTERVAL HISTORY) His Dad is at the bedside.  He is more alert and interactive and confusion is improving.Dr [name] is at bedside   OBJECTIVE Temp:  [97.3 F (36.3 C)-98.5 F (36.9 C)] 97.9 F (36.6 C) (12/05 0358) Pulse Rate:  [67-82] 67 (12/05 0358) Cardiac Rhythm:  [-] Normal sinus rhythm (12/04 2015) Resp:  [13-32] 19 (12/05 0358) BP: (124-132)/(73-86) 124/80 mmHg (12/05 0358) SpO2:  [96 %-98 %] 98 % (12/05 0358)   Recent Labs Lab 03/03/14 0730 03/03/14 1134 03/03/14 1636 03/03/14 2154 03/04/14 0623  GLUCAP 176* 154* 104* 167* 144*    Recent Labs Lab 02/28/14 0355  03/01/14 0509 03/01/14 0910 03/02/14 0353 03/03/14 0229  NA 134* 134* 132* 138 143  K 4.4 3.3* 4.0 4.4 3.6*  CL 96 95* 93* 101 103  CO2 24 17* 18* 22 24  GLUCOSE 273* 319* 408* 333* 173*  BUN 16 18 19 21 22   CREATININE 0.91 0.99 1.07 0.86 0.77  CALCIUM 9.1 8.7 8.7 8.5 8.9    Recent Labs Lab 02/26/14 1329 03/01/14 0910  AST 17 25  ALT 21 34  ALKPHOS 66 93  BILITOT 0.6 0.9  PROT 7.5 6.7  ALBUMIN 3.0* 2.4*    Recent Labs Lab 02/26/14 1329  02/28/14 0355 03/01/14 0509 03/01/14 0910 03/02/14 0353 03/03/14 0229  WBC 10.9*  < > 12.6* 24.8* 21.7* 21.4* 20.7*  NEUTROABS 9.3*  --   --  23.6* 21.1*  --   --   HGB 13.6  < > 13.0 12.1* 12.3* 11.3* 12.4*  HCT 39.0  < > 36.9* 35.4* 35.9* 33.6* 37.3*  MCV 89.2  < > 88.5 90.1 90.9 90.6 91.0  PLT 183  < > 185 186 168 175 186  < > = values in this interval not displayed. No results for input(s): CKTOTAL, CKMB, CKMBINDEX, TROPONINI in the last 168 hours. No results for input(s): LABPROT, INR in the last 72 hours. No results for input(s): COLORURINE, LABSPEC, [place], GLUCOSEU, HGBUR, BILIRUBINUR, KETONESUR, PROTEINUR, UROBILINOGEN, NITRITE, LEUKOCYTESUR in the last 72 hours.  Invalid input(s): APPERANCEUR     Component Value Date/Time  CHOL 139 04/27/2009 0845   TRIG 69.0 04/27/2009 0845   HDL 45.10 04/27/2009 0845   CHOLHDL 3 04/27/2009 0845   VLDL 13.8 04/27/2009 0845   LDLCALC 80 04/27/2009 0845   Lab Results  Component Value Date   HGBA1C 10.3* 02/27/2014   No results found for: LABOPIA, COCAINSCRNUR, LABBENZ, AMPHETMU, THCU, LABBARB  No results for input(s): ETH in the last 168 hours.  Dg Chest Port 1 View  03/03/2014   CLINICAL DATA:  Respiratory failure  EXAM: PORTABLE CHEST - 1 VIEW  COMPARISON:  12/3/fifth  FINDINGS: Cardiomediastinal silhouette is stable. Endotracheal and NG tube has been removed. Residual mild interstitial prominence bilateral suspicious for residual mild interstitial edema. No segmental  infiltrate. No pneumothorax.  IMPRESSION: NG tube and endotracheal tube has been removed. Residual mild interstitial prominence suspicious for mild residual interstitial edema. No segmental infiltrate. No pneumothorax.   Electronically Signed   By: Lahoma Crocker M.D.   On: 03/03/2014 08:52   EEG 03/01/2014 This EEG is most consistent with a generalized non-specific cerebral dysfunction(encephalopathy). There was no seizure or seizure predisposition recorded on this study.   2-D echocardiogram 03/01/2014 - There is a large echodensity measuring 1.4 x 2 cm attached to the posterior leaflet of the mitral valve. Anterior lealet is mildlythickened at the tip. There are mutiple jets of mitralregurgitation with at least moderate regurgitation.   TEE 03/01/2014  A large echodensity measuring 1.4 x 2 cm is attached to theposterior leaflet of the mitral valve suspicious forendocarditis. It affects predominantly the P2 scallop but extendspartially into P1 and P3 scallops. There is translucent area inthe middle of the posterior leaflet suspicious of an abscess formation. No obvious perforation.Anterior lealet is mildly thickened at the tip. There are mutiplejets of mitral regurgitation with at least moderateregurgitation. There is systolic flow reversal in the right upperpulmonary vein (not left upper PV).No other valves are affected. No abcess in the intervalvularfibrosa.   PHYSICAL EXAM   young caucasian male just extubated.Awake alert. Afebrile. Head is nontraumatic. Neck is supple without bruit. Hearing is normal. Cardiac exam no murmur or gallop. Lungs are clear to auscultation. Distal pulses are well felt. Neurological Exam :   Awake and alert and answers simple questions but disoriented Hesitant nonfluent speech  But no dysarthria or aphasia. Poor attention, registration and recall. Easy distractibility.eye movements full without nystagmus.fundi were not visualized. Vision acuity and fields appear  normal. Hearing is normal. Palatal movements are normal. Face symmetric. Tongue midline. Normal strength, tone, reflexes and coordination. Normal sensation. Gait deferred.  ASSESSMENT/PLAN [name] is a 48 y.o. male with recent excision of 30 lipomas with postop infection. Admitted with temperature 105.8. 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 Antibiotic treatment per ID. Cardiology following. If needed, plan heart cath prior to possible valvular surgery.  Current neurologic deficits unclear due to metabolic condition and recent extubation. Based on current MRI results, anticipate cognitive deficits only. We will follow over the next few days for a clearer neurological picture.  MRI  multiple tiny bicerebral embolic infarcts  2D Echo  Mitral valve echodensity. LV dysfunction with EF 30-35%. Moderate mitral valve regurgitation.  TEE  Posterior leaflet mitral valve echodensity-  SCDs for VTE prophylaxis  Diet heart healthy/carb modified   aspirin 81 mg orally every day prior to admission, now on no  antithrombotics due to bleeding risk  Will follow neuro progress  Therapy recommendations:  Recommend PT, OT & ST for swallow and language once patient able to tolerate  Disposition:  pending   Hypertension  Stable  Hyperlipidemia  Home meds:  lipitor 10,  Not yet resumed in hospital  LDL not drawn  Resume statin once able to swallow  Continue statin at discharge  Diabetes type 2  Steroid-induced hyperglycemia  HgbA1c 10.3, goal < 7.0  Uncontrolled  Other Stroke Risk Factors  ETOH use  Other Active Problems  Lipomas x 30, removed Sept 11 at [place]   Other pertinent history  History of Wilms tumor. Solitary kidney. High-risk of acute kidney  injury.  Recent travel out of the country on business    Hospital day # [place] for Pager information 03/03/2014 8:55 AM   I have personally examined this patient, reviewed notes, independently viewed imaging studies, participated in medical decision making and plan of care. I have made any additions or clarifications directly to the above note. Agree with note above.patient exam suggests mild encephalopathy but I do not see any focal weakness. D/W Dad and Dr [name] and answered questions.Continue antibiotics per ID and CVTS consult for valve surgery Stroke team wll sign off. Call for questions.Antony Contras, MD Medical Director [place] Pager: [phone] 03/03/2014 8:55 AM   To contact Stroke Continuity provider, please refer to [email or web address] After hours, contact General Neurology

## 2014-03-04 NOTE — Progress Notes (Signed)
[facility] PROGRESS NOTE  [name] ERX:[id number] DOB: 1965-04-09 DOA: 02/26/2014 PCP: Orpah Melter, MD   brief narrative 48 year old male with history of hypertension, hyperlipidemia, diabetes mellitus, scoliosis recent removal of multiple lipomas, solitary kidney presented to the ED with intractable low back pain. On further evaluation he reported having low-grade subjective fever for 5-6 weeks with a recent history of [place] travel. Following hospitalization patient was found to be septic with high-grade fever, developed seizures with decompensation requiring intubation and mechanical ventilation. MRI of brain showed multiple small areas of acute bilateral infarct. 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  Significant hospital events  11/29 added to hospitalist service with severe progressive low back pain 11/30 MRI of Lumbar Spine:Degenerative disc disease with superimposed right foraminal disc protrusion at L3-4, abutting the exiting right L3 nerve root as it courses out of the right neural foramen. This could potentially result in nerve root irritation. There is moderate to severe right with mild left foraminal narrowing at this level. Mild degenerative disc disease and facet arthrosis at L4-5 with resultant moderate right and mild left foraminal narrowing. Neurosurgery Dr Kathyrn Sheriff was consulted on the phone. Given acute infection and no significant neurological deficit recommended no acute neurosurgical intervention and have patient follow-up with him as outpatient.  12/02:  patient having high-grade fever with seizures and acute decompensation requiring emergent intubation. MRI Brain: Multiple small areas of acute infarct bilaterally, chronic micro-hemorrhage in L external capsule. Head CT unremarkable. Mild small vessel ischemic microangiopathy 2-D echo shows  a large echodensity measuring 1.4 x 2 cm attached to the posterior leaflet of the mitral valve.  Anterior lealet is mildly thickened at the tip. There are mutiple jets of mitral regurgitation with at least moderate regurgitation TEE done showing A large echodensity measuring 1.4 x 2 cm is attached to the posterior leaflet of the mitral valve suspicious for endocarditis. It affects predominantly the P2 scallop but extends partially into P1 and P3 scallops. There is translucent area in the middle of the posterior leaflet suspicious of an abscess formation. No obvious perforation. Anterior lealet is mildly thickened at the tip. There are multiple jets of mitral regurgitation with at least moderate regurgitation. There is systolic flow reversal in the right upper pulmonary vein (not left upper PV). Patient seen by neurology and empirically started on treatment for meningitis. Placed on Keppra for seizures. EEG done which is consistent with generalized nonspecific cerebral dysfunction (encephalopathy) no acute seizure activity noted. ID consulted and patient placed on empiric vancomycin and Rocephin.  Cardiothoracic surgery consulted who recommended aggressive antibiotic therapy and follow-up echo with progress to therapy. If complications develop ( increase regurgitation, CHF, valve dehiscence perforation or persistent fever and bacteremia despite optimal medical treatment or if visitation increasing in size Patient needs aggressive antibiotic therapy and critical care support and follow-up echocardiogram as we progress with therapy. Should the patient develop increasing regurgitation with congestive heart failure valve dehiscence of perforation or persistent fever and bacteremia with optimal medical treatment or vegetations increasing in size would then consider mitral valve repair or replacement.    12/3. Patient extubated.  12/4 transferred to medical floor under hospitalist service. Vancomycin switched to ampicillin.     MICRO DATA: 12/01 Malaria smear >> NEG 12/01 blood >> amp sens  enterococcus 12/02 Urine >> NEG 12/02 RVP >>  12/02 blood >>     Assessment/Plan: severe sepsis secondary to Mitral valve endocarditis with embolic stroke and enterococcus bacteremia Blood cultures growing enterococcus which is sensitive  to ampicillin. Patient on empiric ampicillin and ceftriaxone. Will need several weeks of IV antibiotics, with full recommendations for ID. Needs PICC once determined. Continue neuro checks. Patient was started on Keppra by neurology but now has been discontinued. We'll verify with them if he needs seizure prophylaxis. Ordered MRA brain and carotid Doppler to rule out for mycotic aneurysm. Would avoid CT angiogram of the brain given solitary kidney. -Appreciate cardiology and thoracic surgery evaluation. Plan for aggressive medical management with antibiotics. If stable would be discharged on antibiotics and follow-up with thoracic surgery and evaluate for mitral valve replacement. -He would also cardiac catheterization prior to valvular surgery. -Patient still has leukocytosis but remains afebrile.  Continue to monitor  Nonischemic Cardiomyopathy Likely in the setting of acute/subacute endocarditis. Echo showing LV dysfunction with EF of 30-35% and moderate mitral regurgitation and global hypokinesis. Continue metoprolol low-dose ACE inhibitor and Lipitor. No strict indication for ASA.  will avoid given higher bleeding risk  Type 2  diabetes mellitus, uncontrolled On Lantus and pre-meal aspart. A1c of 10.3.Marland Kitchen FSG now improved off prednisone  Severe low back pain Patient has scoliosis and chronic low back pain which is controlled with Tylenol alone. Admission MRI of the lumbar spine showing disc protrusion at L3-L4 with L3 nerve root pressure. No findings of discitis or abscess. Pain control with Robaxin and oxycodone. PT eval. If unimproved and symptoms persist we'll obtain repeat MRI. He will follow-up with neurosurgery Dr Kathyrn Sheriff as outpt  DVT  prophylaxis:SCDs  Code Status: FULL  Family Communication: wife at bedside Disposition Plan:  Currently inpt. PT eval pending   Consultants:  Cardiology   neurology   PCCM  CTVS  Procedures:  TEE  Intubation  MRI brain/ LS Spine  MICRO DATA: 12/01 Malaria smear >> NEG 12/01 blood >> amp sens enterococcus 12/02 Urine >> NEG 12/2--repeat blood culture negative  Antibiotics:  Vanc 12/02 >> 12/04 Ceftriaxone 12/02 >>  Ampicillin 12/04 >>   HPI/Subjective: Seen and examined. Wife at bedside. Reports low back pain and not been ambulating since admission.   Objective: Filed Vitals:   03/04/14 1004  BP: 139/78  Pulse: 68  Temp:   Resp: 20    Intake/Output Summary (Last 24 hours) at 03/04/14 1344 Last data filed at 03/04/14 1100  Gross per 24 hour  Intake    370 ml  Output   1225 ml  Net   -855 ml   Filed Weights   02/27/14 0957 03/01/14 1303 03/02/14 0500  Weight: 86.183 kg (190 lb) 87.3 kg (192 lb 7.4 oz) 93.3 kg (205 lb 11 oz)    Exam:   General:  Middle aged male  in NAD  HEENT: no pallor, moist mucosa   chest: clear breath sounds b/l   CVS: N S1&S2, no murmurs  Abd: soft, NT, N,D BS+, foley in place  Ext: warm, no edema   CNS: alert and oriented, good strength and muscle tone b/l  Data Reviewed: Basic Metabolic Panel:  Recent Labs Lab 02/28/14 0355 03/01/14 0509 03/01/14 0910 03/02/14 0353 03/03/14 0229  NA 134* 134* 132* 138 143  K 4.4 3.3* 4.0 4.4 3.6*  CL 96 95* 93* 101 103  CO2 24 17* 18* 22 24  GLUCOSE 273* 319* 408* 333* 173*  BUN _0 CREATININE 0.91 0.99 1.07 0.86 0.77  CALCIUM 9.1 8.7 8.7 8.5 8.9   Liver Function Tests:  Recent Labs Lab 02/26/14 1329 03/01/14 0910  AST 17 25  ALT  21 34  ALKPHOS 66 93  BILITOT 0.6 0.9  PROT 7.5 6.7  ALBUMIN 3.0* 2.4*   No results for input(s): LIPASE, AMYLASE in the last 168 hours. No results for input(s): AMMONIA in the last 168 hours. CBC:  Recent  Labs Lab 02/26/14 1329  02/28/14 0355 03/01/14 0509 03/01/14 0910 03/02/14 0353 03/03/14 0229  WBC 10.9*  < > 12.6* 24.8* 21.7* 21.4* 20.7*  NEUTROABS 9.3*  --   --  23.6* 21.1*  --   --   HGB 13.6  < > 13.0 12.1* 12.3* 11.3* 12.4*  HCT 39.0  < > 36.9* 35.4* 35.9* 33.6* 37.3*  MCV 89.2  < > 88.5 90.1 90.9 90.6 91.0  PLT 183  < > 185 186 168 175 186  < > = values in this interval not displayed. Cardiac Enzymes: No results for input(s): CKTOTAL, CKMB, CKMBINDEX, TROPONINI in the last 168 hours. BNP (last 3 results) No results for input(s): PROBNP in the last 8760 hours. CBG:  Recent Labs Lab 03/03/14 1134 03/03/14 1636 03/03/14 2154 03/04/14 0623 03/04/14 1122  GLUCAP 154* 104* 167* 144* 208*    Recent Results (from the past 240 hour(s))  Malaria smear     Status: None   Collection Time: 02/28/14  3:55 AM  Result Value Ref Range Status   Specimen Description BLOOD  Final   Special Requests Normal  Final   Malaria Prep   Final    No Plasmodium or Other Blood Parasites Seen on Thick or Thin Smears For persons strongly suspected of having a blood parasite,but have negative smears, it is recommended that blood films be repeated approximately every 12 to 24 hours for 3 consecutive  days. Performed at [facility]    Report Status 03/01/2014 FINAL  Final  Culture, blood (routine x 2)     Status: None   Collection Time: 02/28/14  9:10 AM  Result Value Ref Range Status   Specimen Description BLOOD LEFT HAND  Final   Special Requests BOTTLES DRAWN AEROBIC ONLY 5CC  Final   Culture  Setup Time   Final    02/28/2014 13:28 Performed at [facility]    Culture   Final    ENTEROCOCCUS SPECIES Note: SUSCEPTIBILITIES PERFORMED ON PREVIOUS CULTURE WITHIN THE LAST 5 DAYS. Note: Gram Stain Report Called to,Read Back By and Verified With:  CAROL MERRITT _0  03/01/14 SMIAS Performed at [facility]    Report Status 03/03/2014 FINAL  Final  Culture, blood  (routine x 2)     Status: None   Collection Time: 02/28/14  9:10 AM  Result Value Ref Range Status   Specimen Description BLOOD LEFT ARM  Final   Special Requests BOTTLES DRAWN AEROBIC AND ANAEROBIC 5CC  Final   Culture  Setup Time   Final    02/28/2014 13:28 Performed at [facility]    Culture   Final    ENTEROCOCCUS SPECIES Note: Gram Stain Report Called to,Read Back By and Verified With: MONTRESSA _1  03/01/14 SMIAS Performed at [facility]    Report Status 03/03/2014 FINAL  Final   Organism ID, Bacteria ENTEROCOCCUS SPECIES  Final      Susceptibility   Enterococcus species - MIC*    AMPICILLIN <=2 SENSITIVE Sensitive     VANCOMYCIN 2 SENSITIVE Sensitive     * ENTEROCOCCUS SPECIES  Culture, Urine     Status: None   Collection Time: 03/01/14  5:06 AM  Result Value Ref Range Status  Specimen Description URINE, CATHETERIZED  Final   Special Requests Normal  Final   Culture  Setup Time   Final    03/01/2014 08:37 Performed at [place] Performed at [facility]   Final   Culture NO GROWTH Performed at [facility]   Final   Report Status 03/02/2014 FINAL  Final  MRSA PCR Screening     Status: None   Collection Time: 03/01/14  5:15 AM  Result Value Ref Range Status   MRSA by PCR NEGATIVE NEGATIVE Final    Comment:        The GeneXpert MRSA Assay (FDA approved for NASAL specimens only), is one component of a comprehensive MRSA colonization surveillance program. It is not intended to diagnose MRSA infection nor to guide or monitor treatment for MRSA infections.   Respiratory virus panel (routine influenza)     Status: None   Collection Time: 03/01/14  9:10 AM  Result Value Ref Range Status   Source - RVPAN NASAL SWAB  Corrected    Comment: CORRECTED ON 12/04 AT 2154: PREVIOUSLY REPORTED AS NASAL SWAB   Respiratory Syncytial Virus A NOT DETECTED  Final   Respiratory Syncytial Virus B NOT  DETECTED  Final   Influenza A NOT DETECTED  Final   Influenza B NOT DETECTED  Final   Parainfluenza 1 NOT DETECTED  Final   Parainfluenza 2 NOT DETECTED  Final   Parainfluenza 3 NOT DETECTED  Final   Metapneumovirus NOT DETECTED  Final   Rhinovirus NOT DETECTED  Final   Adenovirus NOT DETECTED  Final   Influenza A H1 NOT DETECTED  Final   Influenza A H3 NOT DETECTED  Final    Comment: (NOTE)       Normal Reference Range for each Analyte: NOT DETECTED Testing performed using the Luminex xTAG Respiratory Viral Panel test kit. The analytical performance characteristics of this assay have been determined by [facility].  The modifications have not been cleared or approved by the FDA. This assay has been validated pursuant to the CLIA regulations and is used for clinical purposes. Performed at [facility], blood (routine x 2)     Status: None (Preliminary result)   Collection Time: 03/02/14 12:06 PM  Result Value Ref Range Status   Specimen Description BLOOD RIGHT ARM  Final   Special Requests BOTTLES DRAWN AEROBIC AND ANAEROBIC 10CC  Final   Culture  Setup Time   Final    03/02/2014 16:52 Performed at [facility]    Culture   Final           BLOOD CULTURE RECEIVED NO GROWTH TO DATE CULTURE WILL BE HELD FOR 5 DAYS BEFORE ISSUING A FINAL NEGATIVE REPORT Performed at [facility]    Report Status PENDING  Incomplete     Studies: Dg Chest Port 1 View  03/04/2014   CLINICAL DATA:  Right PICC line placement  EXAM: PORTABLE CHEST - 1 VIEW  COMPARISON:  03/03/2014  FINDINGS: Right PICC line tip lower SVC level. Stable cardiomegaly with vascular congestion. No focal pneumonia, collapse or consolidation. Negative for edema, effusion or pneumothorax. Trachea midline. Improved edema pattern compared to yesterday.  IMPRESSION: Right PICC line tip lower SVC level.  Cardiomegaly with improving edema.   Electronically Signed   By: Daryll Brod M.D.    On: 03/04/2014 12:29   Dg Chest Port 1 View  03/03/2014  CLINICAL DATA:  Respiratory failure  EXAM: PORTABLE CHEST - 1 VIEW  COMPARISON:  12/3/fifth  FINDINGS: Cardiomediastinal silhouette is stable. Endotracheal and NG tube has been removed. Residual mild interstitial prominence bilateral suspicious for residual mild interstitial edema. No segmental infiltrate. No pneumothorax.  IMPRESSION: NG tube and endotracheal tube has been removed. Residual mild interstitial prominence suspicious for mild residual interstitial edema. No segmental infiltrate. No pneumothorax.   Electronically Signed   By: Lahoma Crocker M.D.   On: 03/03/2014 08:52    Scheduled Meds: . ampicillin (OMNIPEN) IV  2 g Intravenous 6 times per day  . aspirin EC  81 mg Oral Daily  . atorvastatin  10 mg Oral q1800  . cefTRIAXone (ROCEPHIN)  IV  2 g Intravenous Q12H  . chlorhexidine      . insulin aspart  0-15 Units Subcutaneous TID WC  . insulin aspart  0-5 Units Subcutaneous QHS  . insulin aspart  4 Units Subcutaneous TID WC  . insulin glargine  20 Units Subcutaneous QHS  . lisinopril  2.5 mg Oral Daily  . metoprolol tartrate  25 mg Oral BID  . sodium chloride  10-40 mL Intracatheter Q12H   Continuous Infusions:     Time spent:25 minutes    [name]  [facility] Pager [phone] If 7PM-7AM, please contact night-coverage at [email or web address], password [facility] 03/04/2014, 1:44 PM  LOS: 6 days

## 2014-03-04 NOTE — Progress Notes (Signed)
I have assessed this pt, BIL BS clear and equal.  I spoke with him about his maintenance inhaler and explained that a physician's order is needed and then pharmacy will send the medication.  RN states that she spoke with Dr. Clementeen Graham about placing this order.

## 2014-03-04 NOTE — Progress Notes (Signed)
Pt voided couple of times during shift after removal of foley. Delia Heady RN

## 2014-03-04 NOTE — Progress Notes (Signed)
Pt wife asked RN about pt inhaler and seizure medication, wife informed there was no ordered seizure on pt current medication list but will contact MD as well as inform respiratory therapist about pt inhaler. Dr. Erlinda Hong from Neurology informed and Dr. Clementeen Graham informed of pt seizure medication as well as pt inhaler since respiratory said it was discontinued and needs to be reordered. Dr. Clementeen Graham said he will review pt chart and place order. Awaiting on orders and will continue to monitor pt quietly. Francis Gaines Minard Millirons RN.

## 2014-03-04 NOTE — Progress Notes (Signed)
STROKE TEAM PROGRESS NOTE   HISTORY Much of history obtained from chart as patient is intubated and sedated.   [name] is an 48 y.o. male admitted by [facility] on 02/26/14 after having a fever of 105.8 and c/o intractable back pain. Recent travel out of [place] so tests being run for that. Recent excision of cysts/lipomas with postop ? infection and was placed on 7days of Levaquin. However, he continued to have fever and chills at home and came to ED. ID has consulted and ordered various tests. No IV antibiotics were started given there was no focal etiology of infection. Over night he became tachycardic noted he was not feeling well, BC returned positive for gram positive cocci in chains and placed on ABX Temperature was noted to raise to 103.6. He was then noted to have a left gaze preference. Patient was brought to CT scan where he was noted to have a peroid of arms flexed and mild tremor--there was question of riggers versus seizure. On the floor he had another episode of generalized tremulous activity and "teeth chattering" which again was possibly riggers versus seizure. During this time he was not alert and only moaning while moving from bed to bed. He was intubated due to airway compromise. MRI showed embolic shower.    SUBJECTIVE (INTERVAL HISTORY) His Dad and wife are at the bedside.  He is more alert and interactive. Clinically much improved. Will have PICC line placed today for long term antibiotics.   OBJECTIVE Temp:  [97.3 F (36.3 C)-98.5 F (36.9 C)] 97.9 F (36.6 C) (12/05 0358) Pulse Rate:  [67-72] 68 (12/05 1004) Cardiac Rhythm:  [-] Normal sinus rhythm (12/04 2015) Resp:  [16-21] 20 (12/05 1004) BP: (124-139)/(73-80) 139/78 mmHg (12/05 1004) SpO2:  [97 %-98 %] 98 % (12/05 1004)   Recent Labs Lab 03/03/14 1134 03/03/14 1636 03/03/14 2154 03/04/14 0623 03/04/14 1122  GLUCAP 154* 104* 167* 144* 208*    Recent Labs Lab 02/28/14 0355 03/01/14 0509 03/01/14 0910  03/02/14 0353 03/03/14 0229  NA 134* 134* 132* 138 143  K 4.4 3.3* 4.0 4.4 3.6*  CL 96 95* 93* 101 103  CO2 24 17* 18* 22 24  GLUCOSE 273* 319* 408* 333* 173*  BUN 16 18 19 21 22   CREATININE 0.91 0.99 1.07 0.86 0.77  CALCIUM 9.1 8.7 8.7 8.5 8.9    Recent Labs Lab 02/26/14 1329 03/01/14 0910  AST 17 25  ALT 21 34  ALKPHOS 66 93  BILITOT 0.6 0.9  PROT 7.5 6.7  ALBUMIN 3.0* 2.4*    Recent Labs Lab 02/26/14 1329  02/28/14 0355 03/01/14 0509 03/01/14 0910 03/02/14 0353 03/03/14 0229  WBC 10.9*  < > 12.6* 24.8* 21.7* 21.4* 20.7*  NEUTROABS 9.3*  --   --  23.6* 21.1*  --   --   HGB 13.6  < > 13.0 12.1* 12.3* 11.3* 12.4*  HCT 39.0  < > 36.9* 35.4* 35.9* 33.6* 37.3*  MCV 89.2  < > 88.5 90.1 90.9 90.6 91.0  PLT 183  < > 185 186 168 175 186  < > = values in this interval not displayed. No results for input(s): CKTOTAL, CKMB, CKMBINDEX, TROPONINI in the last 168 hours. No results for input(s): LABPROT, INR in the last 72 hours.      Component Value Date/Time   CHOL 139 04/27/2009 0845   TRIG 69.0 04/27/2009 0845   HDL 45.10 04/27/2009 0845   CHOLHDL 3 04/27/2009 0845   VLDL 13.8 04/27/2009 0845  [place] 80 04/27/2009 0845   Lab Results  Component Value Date   HGBA1C 10.3* 02/27/2014   No results found for: LABOPIA, COCAINSCRNUR, LABBENZ, AMPHETMU, THCU, LABBARB  No results for input(s): ETH in the last 168 hours.  Dg Chest Port 1 View  03/04/2014   IMPRESSION: Right PICC line tip lower SVC level.  Cardiomegaly with improving edema.     EEG 03/01/2014 This EEG is most consistent with a generalized non-specific cerebral dysfunction(encephalopathy). There was no seizure or seizure predisposition recorded on this study.   2-D echocardiogram 03/01/2014 - There is a large echodensity measuring 1.4 x 2 cm attached to the posterior leaflet of the mitral valve. Anterior lealet is mildlythickened at the tip. There are mutiple jets of mitralregurgitation with at least  moderate regurgitation.   TEE 03/01/2014  A large echodensity measuring 1.4 x 2 cm is attached to theposterior leaflet of the mitral valve suspicious forendocarditis. It affects predominantly the P2 scallop but extendspartially into P1 and P3 scallops. There is translucent area inthe middle of the posterior leaflet suspicious of an abscess formation. No obvious perforation.Anterior lealet is mildly thickened at the tip. There are mutiplejets of mitral regurgitation with at least moderateregurgitation. There is systolic flow reversal in the right upperpulmonary vein (not left upper PV).No other valves are affected. No abcess in the intervalvularfibrosa.  Ct Head Wo Contrast 03/01/2014   IMPRESSION: 1. No acute intracranial pathology seen on CT. 2. Mild small vessel ischemic microangiopathy.      Mr [name] Wo Contrast 03/01/2014   IMPRESSION: Multiple small areas of acute infarct bilaterally, consistent with cerebral emboli.  Chronic microhemorrhage in the left external capsule. Question hypertension     Mr Lumbar Spine W Wo Contrast 02/27/2014   IMPRESSION: 1. No acute abnormality or abnormal enhancement within the lumbar spine. 2. Dextroscoliosis of the lumbar spine with apex at L2. 3. Degenerative disc disease with superimposed right foraminal disc protrusion at L3-4, abutting the exiting right L3 nerve root as it courses out of the right neural foramen. This could potentially result in nerve root irritation. There is moderate to severe right with mild left foraminal narrowing at this level. 4. Mild degenerative disc disease and facet arthrosis at L4-5 with resultant moderate right and mild left foraminal narrowing.     A1C 10.3 and LDL pending  PHYSICAL EXAM young caucasian male no acute distress. Awake alert. Afebrile. Head is nontraumatic. Neck is supple without bruit. Hearing is normal. Cardiac exam no murmur or gallop. Lungs are clear to auscultation. Distal pulses are well  felt. Neurological Exam :   Awake and alert and answers simple questions and follows commands, orientated x 3 except disoriented on year. Language including expression, comprehension, naming and repitition intact.  No dysarthria or aphasia. Poor attention, registration and recall. Easy distractibility.eye movements full without nystagmus. fundi were not visualized. Vision acuity and fields appear normal. Hearing is normal. Palatal movements are normal. Face symmetric. Tongue midline. Normal strength, tone, reflexes and coordination. Normal sensation. Gait deferred.  ASSESSMENT/PLAN [name] is a 48 y.o. male with recent excision of 30 lipomas with postop infection and back pain. Admitted with temperature 105.8. 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 Antibiotic treatment per ID.   Cardiology following. No valvular surgery planned at this time.  MRI  multiple tiny bicerebral  embolic infarcts  2D Echo  Mitral valve echodensity. LV dysfunction with EF 30-35%. Moderate mitral valve regurgitation.  TEE  Posterior leaflet mitral valve echodensity  Due to risk of mycotic aneurysm, we recommend brain vessel images for further evaluation. Due to single kidney, we recommend CTA head, if still concerns, please do MRA head without contrast although distal vessels may not visualized. Also recommend carotid doppler for evaluation of neck vessels.    SCDs for VTE prophylaxis  Diet heart healthy/carb modified   aspirin 81 mg orally every day prior to admission. Currently in on ASA 81mg . For endocarditis caused embolic infarcts, antiplatelet is not indicated unless it is absolutely necessary. At this time, we recommend no antithrombotics due to bleeding risk  Therapy recommendations:  pending    Disposition:  pending   Hypertension  Stable  On lisinopril and metoprolol  Continue monitor  Hyperlipidemia  Home meds:  lipitor 10,  Not yet resumed in hospital  LDL check in am (ordered)  Resumed lipitor 10mg   Continue statin at discharge  Diabetes type 2  HgbA1c 10.3, not at goal < 7.0  Uncontrolled  On lantus and novolog  DM education  On SSI  Other Stroke Risk Factors  ETOH use  Other Active Problems  Lipomas x 30, removed Sept 11 at [place]   Other pertinent history  History of Wilms tumor. Solitary kidney. High-risk of acute kidney injury.  Recent travel out of the country on business    Hospital day # 6  Rosalin Hawking, MD PhD Stroke Neurology 03/04/2014 1:36 PM    To contact Stroke Continuity provider, please refer to [email or web address] After hours, contact General Neurology

## 2014-03-04 NOTE — Progress Notes (Signed)
Pt foley removed as ordered at 11:15am. Pt d/t void and will continue to monitor pt quietly. Francis Gaines Ramina Hulet RN.

## 2014-03-05 DIAGNOSIS — I639 Cerebral infarction, unspecified: Secondary | ICD-10-CM

## 2014-03-05 LAB — CBC
HCT: 35.6 % — ABNORMAL LOW (ref 39.0–52.0)
Hemoglobin: 12 g/dL — ABNORMAL LOW (ref 13.0–17.0)
MCH: 30.4 pg (ref 26.0–34.0)
MCHC: 33.7 g/dL (ref 30.0–36.0)
MCV: 90.1 fL (ref 78.0–100.0)
PLATELETS: 131 10*3/uL — AB (ref 150–400)
RBC: 3.95 MIL/uL — ABNORMAL LOW (ref 4.22–5.81)
RDW: 12.6 % (ref 11.5–15.5)
WBC: 8.9 10*3/uL (ref 4.0–10.5)

## 2014-03-05 LAB — LIPID PANEL
CHOL/HDL RATIO: 5.5 ratio
Cholesterol: 116 mg/dL (ref 0–200)
HDL: 21 mg/dL — AB (ref >39)
LDL Cholesterol: 71 mg/dL (ref 0–99)
Triglycerides: 122 mg/dL (ref <150)
VLDL: 24 mg/dL (ref 0–40)

## 2014-03-05 LAB — GLUCOSE, CAPILLARY
GLUCOSE-CAPILLARY: 153 mg/dL — AB (ref 70–99)
GLUCOSE-CAPILLARY: 219 mg/dL — AB (ref 70–99)
GLUCOSE-CAPILLARY: 202 mg/dL — AB (ref 70–99)
Glucose-Capillary: 213 mg/dL — ABNORMAL HIGH (ref 70–99)

## 2014-03-05 MED ORDER — OXYCODONE HCL 5 MG PO TABS
10.0000 mg | ORAL_TABLET | ORAL | Status: DC | PRN
  Administered 2014-03-05 – 2014-03-08 (×15): 10 mg via ORAL
  Filled 2014-03-05 (×15): qty 2

## 2014-03-05 MED ORDER — INSULIN ASPART 100 UNIT/ML ~~LOC~~ SOLN
6.0000 [IU] | Freq: Three times a day (TID) | SUBCUTANEOUS | Status: DC
  Administered 2014-03-05 – 2014-03-08 (×10): 6 [IU] via SUBCUTANEOUS

## 2014-03-05 MED ORDER — METHOCARBAMOL 500 MG PO TABS
500.0000 mg | ORAL_TABLET | Freq: Four times a day (QID) | ORAL | Status: DC | PRN
  Administered 2014-03-05 – 2014-03-08 (×5): 500 mg via ORAL
  Filled 2014-03-05 (×8): qty 1

## 2014-03-05 MED ORDER — INSULIN GLARGINE 100 UNIT/ML ~~LOC~~ SOLN
25.0000 [IU] | Freq: Every day | SUBCUTANEOUS | Status: DC
Start: 2014-03-05 — End: 2014-03-08
  Administered 2014-03-05 – 2014-03-07 (×3): 25 [IU] via SUBCUTANEOUS
  Filled 2014-03-05 (×4): qty 0.25

## 2014-03-05 NOTE — Progress Notes (Addendum)
[facility] PROGRESS NOTE  Dakin Madani ERX:[id number] DOB: 1965-04-09 DOA: 02/26/2014 PCP: Orpah Melter, MD   brief narrative 48 year old male with history of hypertension, hyperlipidemia, diabetes mellitus, scoliosis recent removal of multiple lipomas, solitary kidney presented to the ED with intractable low back pain. On further evaluation he reported having low-grade subjective fever for 5-6 weeks with a recent history of overseas travel. Following hospitalization patient was found to be septic with high-grade fever, developed seizures with decompensation requiring intubation and mechanical ventilation. MRI of brain showed multiple small areas of acute bilateral infarct. 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  Significant hospital events  11/29 added to hospitalist service with severe progressive low back pain 11/30 MRI of Lumbar Spine:Degenerative disc disease with superimposed right foraminal disc protrusion at L3-4, abutting the exiting right L3 nerve root as it courses out of the right neural foramen. This could potentially result in nerve root irritation. There is moderate to severe right with mild left foraminal narrowing at this level. Mild degenerative disc disease and facet arthrosis at L4-5 with resultant moderate right and mild left foraminal narrowing. Neurosurgery Dr Kathyrn Sheriff was consulted on the phone. Given acute infection and no significant neurological deficit recommended no acute neurosurgical intervention and have patient follow-up with him as outpatient.  12/02:  patient having high-grade fever with seizures and acute decompensation requiring emergent intubation. MRI Brain: Multiple small areas of acute infarct bilaterally, chronic micro-hemorrhage in L external capsule. Head CT unremarkable. Mild small vessel ischemic microangiopathy 2-D echo shows  a large echodensity measuring 1.4 x 2 cm attached to the posterior leaflet of the mitral valve.  Anterior lealet is mildly thickened at the tip. There are mutiple jets of mitral regurgitation with at least moderate regurgitation TEE done showing A large echodensity measuring 1.4 x 2 cm is attached to the posterior leaflet of the mitral valve suspicious for endocarditis. It affects predominantly the P2 scallop but extends partially into P1 and P3 scallops. There is translucent area in the middle of the posterior leaflet suspicious of an abscess formation. No obvious perforation. Anterior lealet is mildly thickened at the tip. There are multiple jets of mitral regurgitation with at least moderate regurgitation. There is systolic flow reversal in the right upper pulmonary vein (not left upper PV). Patient seen by neurology and empirically started on treatment for meningitis. Placed on Keppra for seizures. EEG done which is consistent with generalized nonspecific cerebral dysfunction (encephalopathy) no acute seizure activity noted. ID consulted and patient placed on empiric vancomycin and Rocephin.  Cardiothoracic surgery consulted who recommended aggressive antibiotic therapy and follow-up echo with progress to therapy. If complications develop ( increase regurgitation, CHF, valve dehiscence perforation or persistent fever and bacteremia despite optimal medical treatment or if visitation increasing in size Patient needs aggressive antibiotic therapy and critical care support and follow-up echocardiogram as we progress with therapy. Should the patient develop increasing regurgitation with congestive heart failure valve dehiscence of perforation or persistent fever and bacteremia with optimal medical treatment or vegetations increasing in size would then consider mitral valve repair or replacement.    12/3. Patient extubated.  12/4 transferred to medical floor under hospitalist service. Vancomycin switched to ampicillin.     MICRO DATA: 12/01 Malaria smear >> NEG 12/01 blood >> amp sens  enterococcus 12/02 Urine >> NEG 12/02 RVP >>  12/02 blood >>     Assessment/Plan: severe sepsis secondary to Mitral valve endocarditis with embolic stroke and enterococcus bacteremia Blood cultures growing enterococcus which is sensitive  to ampicillin. Patient on empiric ampicillin and ceftriaxone. Will need 6 weeks of IV antibiotics, . PICC placed. -leucocytosis resolved. ( possibly due to infection and prednisone for back pain on admission) -no need for seizure prophylaxis. EEG unremarkable.  MRA brain negative for mycotic aneurysm.  carotid Doppler pending -Appreciate cardiology and thoracic surgery evaluation. Plan for aggressive medical management with antibiotics. Repeat 2D echo this week.  If stable would be discharged on antibiotics and follow-up with thoracic surgery and evaluate for mitral valve replacement. -He would also needs  cardiac catheterization prior to valvular surgery.   Nonischemic Cardiomyopathy Likely in the setting of acute/subacute endocarditis. Echo showing LV dysfunction with EF of 30-35% and moderate mitral regurgitation and global hypokinesis. Continue metoprolol low-dose ACE inhibitor and Lipitor. No strict indication for ASA.  will avoid given higher bleeding risk  Type 2  diabetes mellitus, uncontrolled On Lantus and pre-meal aspart. A1c of 10.3.Marland Kitchen FSG  improved off prednisone but still in 200s. Increase lantus to 25 units and premeal aspart to 6 u tid.  Severe low back pain Patient has scoliosis and chronic low back pain which is controlled with Tylenol alone. Admission MRI of the lumbar spine showing disc protrusion at L3-L4 with L3 nerve root pressure. No findings of discitis or abscess. Pain control with Robaxin and oxycodone. ( dose increased) . PT eval. If unimproved and symptoms persist we'll obtain repeat MRI. He will follow-up with neurosurgery Dr Kathyrn Sheriff as outpt  DVT prophylaxis:SCDs  Code Status: FULL  Family Communication: wife at  bedside Disposition Plan:  Currently inpt. PT eval pending   Consultants:  Cardiology   neurology   PCCM  CTVS  Procedures:  TEE  Intubation  MRI brain/ LS Spine  MICRO DATA: 12/01 Malaria smear >> NEG 12/01 blood >> amp sens enterococcus 12/02 Urine >> NEG 12/2--repeat blood culture negative  Antibiotics:  Vanc 12/02 >> 12/04 Ceftriaxone 12/02 >>  Ampicillin 12/04 >>   HPI/Subjective: Seen and examined. C/o back pain. Was able to sit to chair this am  Objective: Filed Vitals:   03/05/14 0500  BP: 135/82  Pulse: 65  Temp: 98.4 F (36.9 C)  Resp: 18    Intake/Output Summary (Last 24 hours) at 03/05/14 1132 Last data filed at 03/05/14 1610  Gross per 24 hour  Intake    440 ml  Output    700 ml  Net   -260 ml   Filed Weights   02/27/14 0957 03/01/14 1303 03/02/14 0500  Weight: 86.183 kg (190 lb) 87.3 kg (192 lb 7.4 oz) 93.3 kg (205 lb 11 oz)    Exam:   General:   NAD  HEENT: moist mucosa   chest: clear breath sounds b/l   CVS: N S1&S2, no murmurs  Abd: soft, NT, ND   Ext: warm, no edema, RUE PICC   CNS: alert and oriented, good strength and muscle tone b/l, limited mobility due to back pain  Data Reviewed: Basic Metabolic Panel:  Recent Labs Lab 02/28/14 0355 03/01/14 0509 03/01/14 0910 03/02/14 0353 03/03/14 0229  NA 134* 134* 132* 138 143  K 4.4 3.3* 4.0 4.4 3.6*  CL 96 95* 93* 101 103  CO2 24 17* 18* 22 24  GLUCOSE 273* 319* 408* 333* 173*  BUN _0 CREATININE 0.91 0.99 1.07 0.86 0.77  CALCIUM 9.1 8.7 8.7 8.5 8.9   Liver Function Tests:  Recent Labs Lab 02/26/14 1329 03/01/14 0910  AST 17 25  ALT 21 34  ALKPHOS 66 93  BILITOT 0.6 0.9  PROT 7.5 6.7  ALBUMIN 3.0* 2.4*   No results for input(s): LIPASE, AMYLASE in the last 168 hours. No results for input(s): AMMONIA in the last 168 hours. CBC:  Recent Labs Lab 02/26/14 1329  03/01/14 0509 03/01/14 0910 03/02/14 0353 03/03/14 0229  03/05/14 0635  WBC 10.9*  < > 24.8* 21.7* 21.4* 20.7* 8.9  NEUTROABS 9.3*  --  23.6* 21.1*  --   --   --   HGB 13.6  < > 12.1* 12.3* 11.3* 12.4* 12.0*  HCT 39.0  < > 35.4* 35.9* 33.6* 37.3* 35.6*  MCV 89.2  < > 90.1 90.9 90.6 91.0 90.1  PLT 183  < > 186 168 175 186 131*  < > = values in this interval not displayed. Cardiac Enzymes: No results for input(s): CKTOTAL, CKMB, CKMBINDEX, TROPONINI in the last 168 hours. BNP (last 3 results) No results for input(s): PROBNP in the last 8760 hours. CBG:  Recent Labs Lab 03/04/14 0623 03/04/14 1122 03/04/14 1619 03/04/14 2133 03/05/14 0610  GLUCAP 144* 208* 151* 165* 153*    Recent Results (from the past 240 hour(s))  Malaria smear     Status: None   Collection Time: 02/28/14  3:55 AM  Result Value Ref Range Status   Specimen Description BLOOD  Final   Special Requests Normal  Final   Malaria Prep   Final    No Plasmodium or Other Blood Parasites Seen on Thick or Thin Smears For persons strongly suspected of having a blood parasite,but have negative smears, it is recommended that blood films be repeated approximately every 12 to 24 hours for 3 consecutive  days. Performed at [facility]    Report Status 03/01/2014 FINAL  Final  Culture, blood (routine x 2)     Status: None   Collection Time: 02/28/14  9:10 AM  Result Value Ref Range Status   Specimen Description BLOOD LEFT HAND  Final   Special Requests BOTTLES DRAWN AEROBIC ONLY 5CC  Final   Culture  Setup Time   Final    02/28/2014 13:28 Performed at [facility]    Culture   Final    ENTEROCOCCUS SPECIES Note: SUSCEPTIBILITIES PERFORMED ON PREVIOUS CULTURE WITHIN THE LAST 5 DAYS. Note: Gram Stain Report Called to,Read Back By and Verified With:  CAROL MERRITT _0  03/01/14 SMIAS Performed at [facility]    Report Status 03/03/2014 FINAL  Final  Culture, blood (routine x 2)     Status: None   Collection Time: 02/28/14  9:10 AM  Result Value Ref  Range Status   Specimen Description BLOOD LEFT ARM  Final   Special Requests BOTTLES DRAWN AEROBIC AND ANAEROBIC 5CC  Final   Culture  Setup Time   Final    02/28/2014 13:28 Performed at [facility]    Culture   Final    ENTEROCOCCUS SPECIES Note: Gram Stain Report Called to,Read Back By and Verified With: MONTRESSA _1  03/01/14 SMIAS Performed at [facility]    Report Status 03/03/2014 FINAL  Final   Organism ID, Bacteria ENTEROCOCCUS SPECIES  Final      Susceptibility   Enterococcus species - MIC*    AMPICILLIN <=2 SENSITIVE Sensitive     VANCOMYCIN 2 SENSITIVE Sensitive     * ENTEROCOCCUS SPECIES  Culture, Urine     Status: None   Collection Time: 03/01/14  5:06 AM  Result Value Ref Range Status   Specimen  Description URINE, CATHETERIZED  Final   Special Requests Normal  Final   Culture  Setup Time   Final    03/01/2014 08:37 Performed at [place] Performed at [facility]   Final   Culture NO GROWTH Performed at [facility]   Final   Report Status 03/02/2014 FINAL  Final  MRSA PCR Screening     Status: None   Collection Time: 03/01/14  5:15 AM  Result Value Ref Range Status   MRSA by PCR NEGATIVE NEGATIVE Final    Comment:        The GeneXpert MRSA Assay (FDA approved for NASAL specimens only), is one component of a comprehensive MRSA colonization surveillance program. It is not intended to diagnose MRSA infection nor to guide or monitor treatment for MRSA infections.   Respiratory virus panel (routine influenza)     Status: None   Collection Time: 03/01/14  9:10 AM  Result Value Ref Range Status   Source - RVPAN NASAL SWAB  Corrected    Comment: CORRECTED ON 12/04 AT 2154: PREVIOUSLY REPORTED AS NASAL SWAB   Respiratory Syncytial Virus A NOT DETECTED  Final   Respiratory Syncytial Virus B NOT DETECTED  Final   Influenza A NOT DETECTED  Final   Influenza B NOT DETECTED  Final    Parainfluenza 1 NOT DETECTED  Final   Parainfluenza 2 NOT DETECTED  Final   Parainfluenza 3 NOT DETECTED  Final   Metapneumovirus NOT DETECTED  Final   Rhinovirus NOT DETECTED  Final   Adenovirus NOT DETECTED  Final   Influenza A H1 NOT DETECTED  Final   Influenza A H3 NOT DETECTED  Final    Comment: (NOTE)       Normal Reference Range for each Analyte: NOT DETECTED Testing performed using the Luminex xTAG Respiratory Viral Panel test kit. The analytical performance characteristics of this assay have been determined by [facility].  The modifications have not been cleared or approved by the FDA. This assay has been validated pursuant to the CLIA regulations and is used for clinical purposes. Performed at [facility], blood (routine x 2)     Status: None (Preliminary result)   Collection Time: 03/02/14 12:06 PM  Result Value Ref Range Status   Specimen Description BLOOD RIGHT ARM  Final   Special Requests BOTTLES DRAWN AEROBIC AND ANAEROBIC 10CC  Final   Culture  Setup Time   Final    03/02/2014 16:52 Performed at [facility]    Culture   Final           BLOOD CULTURE RECEIVED NO GROWTH TO DATE CULTURE WILL BE HELD FOR 5 DAYS BEFORE ISSUING A FINAL NEGATIVE REPORT Performed at [facility]    Report Status PENDING  Incomplete     Studies: Mr Virgel Paling Wo Contrast  03/04/2014   CLINICAL DATA:  Shower of emboli to the brain from mitral valve endocarditis. Evaluate for mycotic aneurysm. Enterococcal bacteremia.  EXAM: MRA HEAD WITHOUT CONTRAST  TECHNIQUE: Angiographic images of the Circle of Willis were obtained using MRA technique without intravenous contrast.  COMPARISON:  MRI brain performed 03/01/2014.  FINDINGS: Motion degraded exam results in artifactual signal loss in the carotid siphon regions.  Internal carotid arteries are widely patent based on source image examination. Projection images show artifactual misregistration and signal  loss.  Basilar artery widely patent with vertebrals codominant.  No proximal stenosis of the anterior, middle, or posterior cerebral arteries. The distal MCA and PCA branches are carefully examined and there is no evidence for mycotic aneurysm. None is seen on axial source images as well.  IMPRESSION: Negative for proximal stenosis or mycotic aneurysm.   Electronically Signed   By: Rolla Flatten M.D.   On: 03/04/2014 17:43   Dg Chest Port 1 View  03/04/2014   CLINICAL DATA:  Right PICC line placement  EXAM: PORTABLE CHEST - 1 VIEW  COMPARISON:  03/03/2014  FINDINGS: Right PICC line tip lower SVC level. Stable cardiomegaly with vascular congestion. No focal pneumonia, collapse or consolidation. Negative for edema, effusion or pneumothorax. Trachea midline. Improved edema pattern compared to yesterday.  IMPRESSION: Right PICC line tip lower SVC level.  Cardiomegaly with improving edema.   Electronically Signed   By: Daryll Brod M.D.   On: 03/04/2014 12:29    Scheduled Meds: . ampicillin (OMNIPEN) IV  2 g Intravenous 6 times per day  . atorvastatin  10 mg Oral q1800  . cefTRIAXone (ROCEPHIN)  IV  2 g Intravenous Q12H  . fluticasone  2 puff Inhalation BID  . insulin aspart  0-15 Units Subcutaneous TID WC  . insulin aspart  0-5 Units Subcutaneous QHS  . insulin aspart  4 Units Subcutaneous TID WC  . insulin glargine  20 Units Subcutaneous QHS  . lisinopril  2.5 mg Oral Daily  . metoprolol tartrate  25 mg Oral BID  . sodium chloride  10-40 mL Intracatheter Q12H   Continuous Infusions:     Time spent:25 minutes    Margee Trentham  [facility] Pager [phone] If 7PM-7AM, please contact night-coverage at [email or web address], password [facility] 03/05/2014, 11:32 AM  LOS: 7 days

## 2014-03-05 NOTE — Progress Notes (Signed)
*  PRELIMINARY RESULTS* Vascular Ultrasound Carotid Duplex (Doppler) has been completed.   Findings suggest 1-39% internal carotid artery stenosis bilaterally. Vertebral arteries are patent with antegrade flow.  03/05/2014 2:05 PM Maudry Mayhew, RVT, RDCS, RDMS

## 2014-03-05 NOTE — Evaluation (Signed)
Physical Therapy Evaluation Patient Details Name: [name] MRN: [id number] DOB: 05-18-1965 Today's Date: 03/05/2014   History of Present Illness  48 year old male with history of hypertension, hyperlipidemia, diabetes mellitus, scoliosis recent removal of multiple lipomas, solitary kidney presented to the ED with intractable low back pain. On further evaluation he reported having low-grade subjective fever for 5-6 weeks with a recent history of overseas travel. Following hospitalization patient was found to be septic with high-grade fever, developed seizures with decompensation requiring intubation and mechanical ventilation. MRI of brain showed multiple small areas of acute bilateral infarct. 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  Clinical Impression   Pt admitted with above. Pt currently with functional limitations due to the deficits listed below (see PT Problem List).  Pt will benefit from skilled PT to increase their independence and safety with mobility to allow discharge to the venue listed below.       Follow Up Recommendations CIR    Equipment Recommendations  Rolling walker with 5" wheels;3in1 (PT)    Recommendations for Other Services Rehab consult     Precautions / Restrictions Precautions Precautions: Fall Precaution Comments: Taught back prec for comfort while moving      Mobility  Bed Mobility Overal bed mobility: Needs Assistance Bed Mobility: Rolling;Sidelying to Sit Rolling: Mod assist Sidelying to sit: Mod assist       General bed mobility comments: cues for log roll technique; mod assist to clear feet from EOB and elevate trunk to sitting  Transfers Overall transfer level: Needs assistance Equipment used: Rolling walker (2 wheeled) Transfers: Sit to/from Stand Sit to Stand: Mod assist         General transfer comment: Cues for technqiue, safety, and hand palcement  Ambulation/Gait Ambulation/Gait assistance: Min  assist Ambulation Distance (Feet): 8 Feet Assistive device: Rolling walker (2 wheeled) Gait Pattern/deviations: Step-to pattern;Decreased stride length Gait velocity: quite slow   General Gait Details: Cues for body position in relation to RW; noted heavy dependence on UE support on RW  Stairs            Wheelchair Mobility    Modified Rankin (Stroke Patients Only)       Balance Overall balance assessment: Needs assistance         Standing balance support: Bilateral upper extremity supported Standing balance-Leahy Scale: Poor                               Pertinent Vitals/Pain Pain Assessment: 0-10 Pain Score: 5  Pain Location: low back Pain Descriptors / Indicators: Aching Pain Intervention(s): RN gave pain meds during session    Home Living Family/patient expects to be discharged to:: Private residence Living Arrangements: Spouse/significant other Available Help at Discharge: Family;Available PRN/intermittently Type of Home: House Home Access: Stairs to enter Entrance Stairs-Rails: None Entrance Stairs-Number of Steps: 5 Home Layout: Two level;Able to live on main level with bedroom/bathroom (6yo son's room upstairs) Home Equipment: None      Prior Function Level of Independence: Independent               Hand Dominance        Extremity/Trunk Assessment   Upper Extremity Assessment: Defer to OT evaluation           Lower Extremity Assessment: Generalized weakness (limited by pain; pain grossly symmetrical)         Communication   Communication: No difficulties  Cognition Arousal/Alertness: Awake/alert Behavior During Therapy:  WFL for tasks assessed/performed Overall Cognitive Status: Within Functional Limits for tasks assessed                      General Comments      Exercises        Assessment/Plan    PT Assessment Patient needs continued PT services  PT Diagnosis Difficulty walking;Acute  pain;Generalized weakness   PT Problem List Decreased strength;Decreased range of motion;Decreased activity tolerance;Decreased balance;Decreased mobility;Decreased coordination;Decreased knowledge of use of DME;Pain;Decreased knowledge of precautions  PT Treatment Interventions DME instruction;Gait training;Stair training;Functional mobility training;Therapeutic activities;Therapeutic exercise;Balance training;Patient/family education   PT Goals (Current goals can be found in the Care Plan section) Acute Rehab PT Goals Patient Stated Goal: less pain PT Goal Formulation: With patient Time For Goal Achievement: 03/19/14 Potential to Achieve Goals: Good    Frequency Min 5X/week   Barriers to discharge        Co-evaluation               End of Session Equipment Utilized During Treatment: Gait belt Activity Tolerance: Patient limited by pain Patient left: in chair;with call bell/phone within reach Nurse Communication: Mobility status         Time: 5027-7412 PT Time Calculation (min) (ACUTE ONLY): 22 min   Charges:   PT Evaluation $Initial PT Evaluation Tier I: 1 Procedure PT Treatments $Gait Training: 8-22 mins   PT G Codes:          Quin Hoop 03/05/2014, 5:33 PM  Roney Marion, [place] Pager [phone] Office [phone]

## 2014-03-05 NOTE — Progress Notes (Signed)
STROKE TEAM PROGRESS NOTE   HISTORY Much of history obtained from chart as patient is intubated and sedated.   [name] is an 48 y.o. male admitted by [facility] on 02/26/14 after having a fever of 105.8 and c/o intractable back pain. Recent travel out of [place] so tests being run for that. Recent excision of cysts/lipomas with postop ? infection and was placed on 7days of Levaquin. However, he continued to have fever and chills at home and came to ED. ID has consulted and ordered various tests. No IV antibiotics were started given there was no focal etiology of infection. Over night he became tachycardic noted he was not feeling well, BC returned positive for gram positive cocci in chains and placed on ABX Temperature was noted to raise to 103.6. He was then noted to have a left gaze preference. Patient was brought to CT scan where he was noted to have a peroid of arms flexed and mild tremor--there was question of riggers versus seizure. On the floor he had another episode of generalized tremulous activity and "teeth chattering" which again was possibly riggers versus seizure. During this time he was not alert and only moaning while moving from bed to bed. He was intubated due to airway compromise. MRI showed embolic shower.    SUBJECTIVE (INTERVAL HISTORY) His Dad and wife are at the bedside.  He is alert and interactive. Clinically much improved. On antibiotics. Had MRA yesterday and did not see mycotic aneurysm. ASA discontinued.   OBJECTIVE Temp:  [98 F (36.7 C)-98.5 F (36.9 C)] 98 F (36.7 C) (12/06 1412) Pulse Rate:  [65-71] 66 (12/06 1412) Cardiac Rhythm:  [-] Normal sinus rhythm (12/06 0800) Resp:  [17-18] 17 (12/06 1412) BP: (132-136)/(81-83) 132/81 mmHg (12/06 1412) SpO2:  [97 %-98 %] 98 % (12/06 1412)   Recent Labs Lab 03/04/14 1619 03/04/14 2133 03/05/14 0610 03/05/14 1132 03/05/14 1602  GLUCAP 151* 165* 153* 219* 213*    Recent Labs Lab 02/28/14 0355 03/01/14 0509  03/01/14 0910 03/02/14 0353 03/03/14 0229  NA 134* 134* 132* 138 143  K 4.4 3.3* 4.0 4.4 3.6*  CL 96 95* 93* 101 103  CO2 24 17* 18* 22 24  GLUCOSE 273* 319* 408* 333* 173*  BUN 16 18 19 21 22   CREATININE 0.91 0.99 1.07 0.86 0.77  CALCIUM 9.1 8.7 8.7 8.5 8.9    Recent Labs Lab 03/01/14 0910  AST 25  ALT 34  ALKPHOS 93  BILITOT 0.9  PROT 6.7  ALBUMIN 2.4*    Recent Labs Lab 03/01/14 0509 03/01/14 0910 03/02/14 0353 03/03/14 0229 03/05/14 0635  WBC 24.8* 21.7* 21.4* 20.7* 8.9  NEUTROABS 23.6* 21.1*  --   --   --   HGB 12.1* 12.3* 11.3* 12.4* 12.0*  HCT 35.4* 35.9* 33.6* 37.3* 35.6*  MCV 90.1 90.9 90.6 91.0 90.1  PLT 186 168 175 186 131*   No results for input(s): CKTOTAL, CKMB, CKMBINDEX, TROPONINI in the last 168 hours. No results for input(s): LABPROT, INR in the last 72 hours.      Component Value Date/Time   CHOL 116 03/05/2014 0630   TRIG 122 03/05/2014 0630   HDL 21* 03/05/2014 0630   CHOLHDL 5.5 03/05/2014 0630   VLDL 24 03/05/2014 0630   LDLCALC 71 03/05/2014 0630   Lab Results  Component Value Date   HGBA1C 10.3* 02/27/2014   No results found for: LABOPIA, COCAINSCRNUR, LABBENZ, AMPHETMU, THCU, LABBARB  No results for input(s): ETH in the last 168  hours.  Dg Chest Port 1 View  03/04/2014   IMPRESSION: Right PICC line tip lower SVC level.  Cardiomegaly with improving edema.     EEG 03/01/2014 This EEG is most consistent with a generalized non-specific cerebral dysfunction(encephalopathy). There was no seizure or seizure predisposition recorded on this study.   2-D echocardiogram 03/01/2014 - There is a large echodensity measuring 1.4 x 2 cm attached to the posterior leaflet of the mitral valve. Anterior lealet is mildlythickened at the tip. There are mutiple jets of mitralregurgitation with at least moderate regurgitation.   TEE 03/01/2014  A large echodensity measuring 1.4 x 2 cm is attached to theposterior leaflet of the mitral valve  suspicious forendocarditis. It affects predominantly the P2 scallop but extendspartially into P1 and P3 scallops. There is translucent area inthe middle of the posterior leaflet suspicious of an abscess formation. No obvious perforation.Anterior lealet is mildly thickened at the tip. There are mutiplejets of mitral regurgitation with at least moderateregurgitation. There is systolic flow reversal in the right upperpulmonary vein (not left upper PV).No other valves are affected. No abcess in the intervalvularfibrosa.  Ct Head Wo Contrast 03/01/2014   IMPRESSION: 1. No acute intracranial pathology seen on CT. 2. Mild small vessel ischemic microangiopathy.      Mr [name] Wo Contrast 03/01/2014   IMPRESSION: Multiple small areas of acute infarct bilaterally, consistent with cerebral emboli.  Chronic microhemorrhage in the left external capsule. Question hypertension     Mr Lumbar Spine W Wo Contrast 02/27/2014   IMPRESSION: 1. No acute abnormality or abnormal enhancement within the lumbar spine. 2. Dextroscoliosis of the lumbar spine with apex at L2. 3. Degenerative disc disease with superimposed right foraminal disc protrusion at L3-4, abutting the exiting right L3 nerve root as it courses out of the right neural foramen. This could potentially result in nerve root irritation. There is moderate to severe right with mild left foraminal narrowing at this level. 4. Mild degenerative disc disease and facet arthrosis at L4-5 with resultant moderate right and mild left foraminal narrowing.     MRA head - Negative for proximal stenosis or mycotic aneurysm.  CUS - Findings suggest 1-39% internal carotid artery stenosis bilaterally. Vertebral arteries are patent with antegrade flow.  A1C 10.3 and LDL pending  PHYSICAL EXAM young caucasian male no acute distress. Awake alert. Afebrile. Head is nontraumatic. Neck is supple without bruit. Hearing is normal. Cardiac exam no murmur or gallop. Lungs are  clear to auscultation. Distal pulses are well felt. Neurological Exam :   Awake and alert and answers simple questions and follows commands, orientated x 3. Language including expression, comprehension, naming and repitition intact.  No dysarthria or aphasia. Poor attention, registration and recall. Easy distractibility.eye movements full without nystagmus. fundi were not visualized. Vision acuity and fields appear normal. Hearing is normal. Palatal movements are normal. Face symmetric. Tongue midline. Normal strength, tone, reflexes and coordination. Normal sensation. Gait deferred.  ASSESSMENT/PLAN [name] is a 48 y.o. male with recent excision of 30 lipomas with postop infection and back pain. Admitted with temperature 105.8. 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 Antibiotic treatment per ID.   Cardiology following. No valvular surgery planned at this time.  MRI  multiple tiny bicerebral embolic infarcts  MRA no mycotic aneurysm  Carotid doppler negative.  2D Echo  Mitral valve echodensity. LV dysfunction with EF 30-35%. Moderate mitral valve regurgitation.  TEE  Posterior leaflet mitral valve echodensity  SCDs for VTE prophylaxis  Diet heart healthy/carb modified   aspirin 81 mg orally every day prior to admission. Currently in on ASA 81mg . For endocarditis caused embolic infarcts, antiplatelet is not indicated unless it is absolutely necessary. His ASA was discontinued.  Therapy recommendations:  pending   Disposition:  pending   Hypertension  Stable  On lisinopril and metoprolol  Continue monitor  Hyperlipidemia  Home meds:  lipitor 10,  Not yet resumed in hospital  LDL 71, almost at goal < 70  Resumed lipitor 10mg   Continue statin at  discharge  Diabetes type 2  HgbA1c 10.3, not at goal < 7.0  Uncontrolled  On lantus and novolog  DM education  On SSI  Other Stroke Risk Factors  ETOH use  Other Active Problems  Lipomas x 30, removed Sept 11 at [place]   Other pertinent history  History of Wilms tumor. Solitary kidney. High-risk of acute kidney injury.  Recent travel out of the country on business    Hospital day # 7  Neurology will sign off. Please call with questions. Thanks for the consult.  Rosalin Hawking, MD PhD Stroke Neurology 03/05/2014 4:19 PM    To contact Stroke Continuity provider, please refer to [email or web address] After hours, contact General Neurology

## 2014-03-05 NOTE — Progress Notes (Signed)
Subjective:  Major complaint is back pain.  No complaints of shortness of breath or chest pain.  No recurrent fevers.  Objective:  Vital Signs in the last 24 hours: BP 135/82 mmHg  Pulse 65  Temp(Src) 98.4 F (36.9 C) (Oral)  Resp 18  Ht 6' (1.829 m)  Wt 93.3 kg (205 lb 11 oz)  BMI 27.89 kg/m2  SpO2 97%  Physical Exam: Pleasant male currently in no acute distress Lungs:  Clear  Cardiac:  Regular rhythm, normal S1 and S2, no S3, 1/6 systolic murmur Extremities:  No edema present  Intake/Output from previous day: 12/05 0701 - 12/06 0700 In: 510 [P.O.:360; IV Piggyback:150] Out: 1100 [Urine:1100] Weight Filed Weights   02/27/14 0957 03/01/14 1303 03/02/14 0500  Weight: 86.183 kg (190 lb) 87.3 kg (192 lb 7.4 oz) 93.3 kg (205 lb 11 oz)    Lab Results: Basic Metabolic Panel:  Recent Labs  03/03/14 0229  NA 143  K 3.6*  CL 103  CO2 24  GLUCOSE 173*  BUN 22  CREATININE 0.77    CBC:  Recent Labs  03/03/14 0229 03/05/14 0635  WBC 20.7* 8.9  HGB 12.4* 12.0*  HCT 37.3* 35.6*  MCV 91.0 90.1  PLT 186 131*   Telemetry: Sinus rhythm  Assessment/Plan:  1.  Mitral valve endocarditis with enterococcus. 2.  Prior history of cerebral septic emboli. 3.  Hypertension.  Recommendations:  At the present time he has no evidence of recurrent fever, persistent positive blood cultures as his current repeat blood culture is negative, or hemodynamic instability.  Continue treatment for endocarditis and reassess with echo next week.   Kerry Hough  MD [facility] Cardiology  03/05/2014, 11:29 AM

## 2014-03-05 NOTE — Plan of Care (Signed)
Problem: Phase I Progression Outcomes Goal: OOB as tolerated unless otherwise ordered Outcome: Progressing     

## 2014-03-06 DIAGNOSIS — I059 Rheumatic mitral valve disease, unspecified: Secondary | ICD-10-CM

## 2014-03-06 DIAGNOSIS — M79609 Pain in unspecified limb: Secondary | ICD-10-CM

## 2014-03-06 LAB — CBC
HCT: 36.4 % — ABNORMAL LOW (ref 39.0–52.0)
Hemoglobin: 12.3 g/dL — ABNORMAL LOW (ref 13.0–17.0)
MCH: 30.7 pg (ref 26.0–34.0)
MCHC: 33.8 g/dL (ref 30.0–36.0)
MCV: 90.8 fL (ref 78.0–100.0)
PLATELETS: 137 10*3/uL — AB (ref 150–400)
RBC: 4.01 MIL/uL — AB (ref 4.22–5.81)
RDW: 12.6 % (ref 11.5–15.5)
WBC: 10.7 10*3/uL — ABNORMAL HIGH (ref 4.0–10.5)

## 2014-03-06 LAB — GLUCOSE, CAPILLARY
GLUCOSE-CAPILLARY: 165 mg/dL — AB (ref 70–99)
Glucose-Capillary: 155 mg/dL — ABNORMAL HIGH (ref 70–99)
Glucose-Capillary: 197 mg/dL — ABNORMAL HIGH (ref 70–99)
Glucose-Capillary: 140 mg/dL — ABNORMAL HIGH (ref 70–99)

## 2014-03-06 MED ORDER — INSULIN STARTER KIT- PEN NEEDLES (ENGLISH)
1.0000 | Freq: Once | Status: AC
  Administered 2014-03-06: 1
  Filled 2014-03-06: qty 1

## 2014-03-06 MED ORDER — LIVING WELL WITH DIABETES BOOK
Freq: Once | Status: AC
  Administered 2014-03-06: 17:00:00
  Filled 2014-03-06: qty 1

## 2014-03-06 NOTE — Progress Notes (Signed)
Subjective:  No CP/SOB. Being Rx for SBE  Objective:  Temp:  [97.8 F (36.6 C)-98.5 F (36.9 C)] 97.8 F (36.6 C) (12/07 0500) Pulse Rate:  [66-72] 72 (12/07 1029) Resp:  [17-18] 18 (12/07 0500) BP: (127-145)/(71-81) 145/71 mmHg (12/07 1029) SpO2:  [97 %-98 %] 98 % (12/07 0500) Weight change:   Intake/Output from previous day: 12/06 0701 - 12/07 0700 In: 750 [P.O.:600; IV Piggyback:150] Out: 700 [Urine:700]  Intake/Output from this shift: Total I/O In: 240 [P.O.:240] Out: 400 [Urine:400]  Physical Exam: General appearance: alert and no distress Neck: no adenopathy, no carotid bruit, no JVD, supple, symmetrical, trachea midline and thyroid not enlarged, symmetric, no tenderness/mass/nodules Lungs: clear to auscultation bilaterally Heart: regular rate and rhythm, S1, S2 normal, no murmur, click, rub or gallop Extremities: extremities normal, atraumatic, no cyanosis or edema  Lab Results: Results for orders placed or performed during the hospital encounter of 02/26/14 (from the past 48 hour(s))  Glucose, capillary     Status: Abnormal   Collection Time: 03/04/14 11:22 AM  Result Value Ref Range   Glucose-Capillary 208 (H) 70 - 99 mg/dL  Glucose, capillary     Status: Abnormal   Collection Time: 03/04/14  4:19 PM  Result Value Ref Range   Glucose-Capillary 151 (H) 70 - 99 mg/dL  Glucose, capillary     Status: Abnormal   Collection Time: 03/04/14  9:33 PM  Result Value Ref Range   Glucose-Capillary 165 (H) 70 - 99 mg/dL  Glucose, capillary     Status: Abnormal   Collection Time: 03/05/14  6:10 AM  Result Value Ref Range   Glucose-Capillary 153 (H) 70 - 99 mg/dL  Lipid panel     Status: Abnormal   Collection Time: 03/05/14  6:30 AM  Result Value Ref Range   Cholesterol 116 0 - 200 mg/dL   Triglycerides 122 <150 mg/dL   HDL 21 (L) >39 mg/dL   Total CHOL/HDL Ratio 5.5 RATIO   VLDL 24 0 - 40 mg/dL   LDL Cholesterol 71 0 - 99 mg/dL    Comment:        Total  Cholesterol/HDL:CHD Risk Coronary Heart Disease Risk Table                     Men   Women  1/2 Average Risk   3.4   3.3  Average Risk       5.0   4.4  2 X Average Risk   9.6   7.1  3 X Average Risk  23.4   11.0        Use the calculated Patient Ratio above and the CHD Risk Table to determine the patient's CHD Risk.        ATP III CLASSIFICATION (LDL):  <100     mg/dL   Optimal  100-129  mg/dL   Near or Above                    Optimal  130-159  mg/dL   Borderline  160-189  mg/dL   High  >190     mg/dL   Very High   CBC     Status: Abnormal   Collection Time: 03/05/14  6:35 AM  Result Value Ref Range   WBC 8.9 4.0 - 10.5 K/uL   RBC 3.95 (L) 4.22 - 5.81 MIL/uL   Hemoglobin 12.0 (L) 13.0 - 17.0 g/dL   HCT 35.6 (L) 39.0 - 52.0 %  MCV 90.1 78.0 - 100.0 fL   MCH 30.4 26.0 - 34.0 pg   MCHC 33.7 30.0 - 36.0 g/dL   RDW 12.6 11.5 - 15.5 %   Platelets 131 (L) 150 - 400 K/uL  Glucose, capillary     Status: Abnormal   Collection Time: 03/05/14 11:32 AM  Result Value Ref Range   Glucose-Capillary 219 (H) 70 - 99 mg/dL  Glucose, capillary     Status: Abnormal   Collection Time: 03/05/14  4:02 PM  Result Value Ref Range   Glucose-Capillary 213 (H) 70 - 99 mg/dL  Glucose, capillary     Status: Abnormal   Collection Time: 03/05/14  9:49 PM  Result Value Ref Range   Glucose-Capillary 202 (H) 70 - 99 mg/dL  Glucose, capillary     Status: Abnormal   Collection Time: 03/06/14  5:50 AM  Result Value Ref Range   Glucose-Capillary 155 (H) 70 - 99 mg/dL  CBC     Status: Abnormal   Collection Time: 03/06/14  6:03 AM  Result Value Ref Range   WBC 10.7 (H) 4.0 - 10.5 K/uL   RBC 4.01 (L) 4.22 - 5.81 MIL/uL   Hemoglobin 12.3 (L) 13.0 - 17.0 g/dL   HCT 36.4 (L) 39.0 - 52.0 %   MCV 90.8 78.0 - 100.0 fL   MCH 30.7 26.0 - 34.0 pg   MCHC 33.8 30.0 - 36.0 g/dL   RDW 12.6 11.5 - 15.5 %   Platelets 137 (L) 150 - 400 K/uL    Imaging: Imaging results have been reviewed  Tele:  NSR  Assessment/Plan:   1. Principal Problem: 2.   Acute endocarditis 3. Active Problems: 4.   HYPERTENSION, BENIGN 5.   Family history of ischemic heart disease 6.   Intractable back pain 7.   Hyperglycemia 8.   DM (diabetes mellitus), type 2, uncontrolled 9.   Hyperlipidemia 10.   Gram-positive bacteremia 11.   Severe sepsis 12.   Change in mental status 13.   Cardiomyopathy- EF 30-35% 14.   CVA (cerebral infarction)- embolic 15.   Acute respiratory failure 16.   Cerebral septic emboli 17.   Acute bacterial endocarditis 18.   Time Spent Directly with Patient:  20 minutes  Length of Stay:  LOS: 8 days   SBE by [facility] and TEE. Posterior leaflet (P2) with vegetation. Moderate MR with moderate LV dysfunction. No Sx. On IV ATBX. Has PICC line. ID following. Follow BC apparently neg. Currently no indication for MVR. Question why his LV FXN is reduced (? Sepsis, doubt secondary to MR). Will repeat TTE.   Talissa Apple J 03/06/2014, 11:06 AM

## 2014-03-06 NOTE — Progress Notes (Addendum)
NUTRITION FOLLOW UP   DOCUMENTATION CODES Per approved criteria  -Non-severe (moderate) malnutrition in the context of acute illness or injury   Intervention: No nutrition intervention at this time  RD to continue to follow  Nutrition Dx: Inadequate oral intake, resolved  New Goal: Pt to meet >/= 90% of their estimated nutrition needs, met  Monitor:  PO intake, weight, labs, I/O's  ASSESSMENT: 48 y/o M with PMH of HTN, HLD, DM, scoliosis and recent removal of multiple lipomas (~30) and poor wound healing who was admitted on 11/29 with c/o's intractable low back pain.  Patient extubated 12/3.  Transferred from [facility] ICU to [facility] 12/2.  Patient currently on a Heart Healthy/Carbohydrate Modified diet.  PO intake good at 75-100% per flowsheet records. Cardiac & Thoracic Surgery following for mitral valve endocarditis.    RD feels pt is meeting >/= 90% of estimated nutrition needs.  Height: Ht Readings from Last 1 Encounters:  03/01/14 6' (1.829 m)    Weight: Wt Readings from Last 1 Encounters:  03/02/14 205 lb 11 oz (93.3 kg)    BMI:  Body mass index is 27.89 kg/(m^2).  Re-estimated Nutritional Needs: Kcal: 2100-2300 Protein: 110-120 gm Fluid: 2.1-2.3 L  Skin: surgical incisions from lipoma removal  Diet Order: Diet heart healthy/carb modified    Intake/Output Summary (Last 24 hours) at 03/06/14 1158 Last data filed at 03/06/14 0900  Gross per 24 hour  Intake    720 ml  Output    900 ml  Net   -180 ml    Labs:   Recent Labs Lab 03/01/14 0910 03/02/14 0353 03/03/14 0229  NA 132* 138 143  K 4.0 4.4 3.6*  CL 93* 101 103  CO2 18* 22 24  BUN _0 CREATININE 1.07 0.86 0.77  CALCIUM 8.7 8.5 8.9  GLUCOSE 408* 333* 173*    CBG (last 3)   Recent Labs  03/05/14 2149 03/06/14 0550 03/06/14 1116  GLUCAP 202* 155* 197*    Scheduled Meds: . ampicillin (OMNIPEN) IV  2 g Intravenous 6 times per day  . atorvastatin  10 mg Oral q1800  . cefTRIAXone  (ROCEPHIN)  IV  2 g Intravenous Q12H  . fluticasone  2 puff Inhalation BID  . insulin aspart  0-15 Units Subcutaneous TID WC  . insulin aspart  0-5 Units Subcutaneous QHS  . insulin aspart  6 Units Subcutaneous TID WC  . insulin glargine  25 Units Subcutaneous QHS  . lisinopril  2.5 mg Oral Daily  . metoprolol tartrate  25 mg Oral BID  . sodium chloride  10-40 mL Intracatheter Q12H    Continuous Infusions:    Past Medical History  Diagnosis Date  . Diabetes mellitus without complication     type 2  . Hyperlipidemia   . Allergic rhinitis   . lipoma   . H/O scoliosis   . Hypertension     Diagnostic exercise tolerance test assessment:04/30/2010 : comments normal -no evidence os ischemia by ST analysis  . Solitary kidney   . Wilm's tumor (nephroblastoma)     Past Surgical History  Procedure Laterality Date  . Lipoma excision    . Total nephrectomy Left   . Thyroid surgery    . Knee surgery      Arthur Holms, RD, LDN Pager #: [phone] After-Hours Pager #: [phone]

## 2014-03-06 NOTE — Progress Notes (Signed)
Patient ID: [name], male   DOB: 09-13-1965, 48 y.o.   MRN: [id number]      [address]       [place] [address]             [phone]                      LOS: 8 days   Subjective: Awake and alert, complaint of back pain and bilateral calf tenderness rt>Left Able to carry on conversation with little difficulty compared to 2 days ago  Objective: Vital signs in last 24 hours: Patient Vitals for the past 24 hrs:  BP Temp Temp src Pulse Resp SpO2  03/06/14 0500 127/79 mmHg 97.8 F (36.6 C) Oral 72 18 98 %  03/05/14 1947 128/80 mmHg 98.5 F (36.9 C) Oral 71 18 97 %  03/05/14 1412 132/81 mmHg 98 F (36.7 C) Oral 66 17 98 %    Filed Weights   02/27/14 0957 03/01/14 1303 03/02/14 0500  Weight: 190 lb (86.183 kg) 192 lb 7.4 oz (87.3 kg) 205 lb 11 oz (93.3 kg)    Hemodynamic parameters for last 24 hours:    Intake/Output from previous day: 12/06 0701 - 12/07 0700 In: 750 [P.O.:600; IV Piggyback:150] Out: 700 [Urine:700] Intake/Output this shift: Total I/O In: -  Out: 400 [Urine:400]  Scheduled Meds: . ampicillin (OMNIPEN) IV  2 g Intravenous 6 times per day  . atorvastatin  10 mg Oral q1800  . cefTRIAXone (ROCEPHIN)  IV  2 g Intravenous Q12H  . fluticasone  2 puff Inhalation BID  . insulin aspart  0-15 Units Subcutaneous TID WC  . insulin aspart  0-5 Units Subcutaneous QHS  . insulin aspart  6 Units Subcutaneous TID WC  . insulin glargine  25 Units Subcutaneous QHS  . lisinopril  2.5 mg Oral Daily  . metoprolol tartrate  25 mg Oral BID  . sodium chloride  10-40 mL Intracatheter Q12H   Continuous Infusions:  PRN Meds:.acetaminophen, albuterol, fentaNYL, LORazepam, methocarbamol, [DISCONTINUED] ondansetron **OR** ondansetron (ZOFRAN) IV, oxyCODONE, sodium chloride  General appearance: alert, cooperative and no distress Neurologic: intact Heart: regular rate and rhythm, S1, S2 normal, no murmur, click, rub or gallop Lungs: clear to  auscultation bilaterally Abdomen: soft, non-tender; bowel sounds normal; no masses,  no organomegaly Extremities: extremities normal, atraumatic, no cyanosis or edema, Homans sign is negative, no sign of DVT and no sign of embolation in extremities no  peripherial embolization , no pedal edema  Lab Results: CBC: Recent Labs  03/05/14 0635 03/06/14 0603  WBC 8.9 10.7*  HGB 12.0* 12.3*  HCT 35.6* 36.4*  PLT 131* 137*   BMET: No results for input(s): NA, K, CL, CO2, GLUCOSE, BUN, CREATININE, CALCIUM in the last 72 hours.  PT/INR: No results for input(s): LABPROT, INR in the last 72 hours.   Radiology Mr Jodene Nam Head Wo Contrast  03/04/2014   CLINICAL DATA:  Shower of emboli to the brain from mitral valve endocarditis. Evaluate for mycotic aneurysm. Enterococcal bacteremia.  EXAM: MRA HEAD WITHOUT CONTRAST  TECHNIQUE: Angiographic images of the Circle of Willis were obtained using MRA technique without intravenous contrast.  COMPARISON:  MRI brain performed 03/01/2014.  FINDINGS: Motion degraded exam results in artifactual signal loss in the carotid siphon regions.  Internal carotid arteries are widely patent based on source image examination. Projection images show artifactual misregistration and signal loss.  Basilar artery widely patent with vertebrals codominant.  No proximal stenosis  of the anterior, middle, or posterior cerebral arteries. The distal MCA and PCA branches are carefully examined and there is no evidence for mycotic aneurysm. None is seen on axial source images as well.  IMPRESSION: Negative for proximal stenosis or mycotic aneurysm.   Electronically Signed   By: Rolla Flatten M.D.   On: 03/04/2014 17:43   Dg Chest Port 1 View  03/04/2014   CLINICAL DATA:  Right PICC line placement  EXAM: PORTABLE CHEST - 1 VIEW  COMPARISON:  03/03/2014  FINDINGS: Right PICC line tip lower SVC level. Stable cardiomegaly with vascular congestion. No focal pneumonia, collapse or consolidation.  Negative for edema, effusion or pneumothorax. Trachea midline. Improved edema pattern compared to yesterday.  IMPRESSION: Right PICC line tip lower SVC level.  Cardiomegaly with improving edema.   Electronically Signed   By: Daryll Brod M.D.   On: 03/04/2014 12:29     Assessment/Plan: Mitral valve endocarditis with ENTEROCOCCUS SPECIES , culture 12/3 negative, no fever- follow up echo TTE R/o lower extremity DVT    Grace Isaac MD 03/06/2014 8:56 AM

## 2014-03-06 NOTE — Progress Notes (Signed)
INFECTIOUS DISEASE PROGRESS NOTE  ID: [name] is a 48 y.o. male with  Principal Problem:   Acute endocarditis Active Problems:   HYPERTENSION, BENIGN   Family history of ischemic heart disease   Intractable back pain   Hyperglycemia   DM (diabetes mellitus), type 2, uncontrolled   Hyperlipidemia   Gram-positive bacteremia   Severe sepsis   Change in mental status   Cardiomyopathy- EF 30-35%   CVA (cerebral infarction)- embolic   Acute respiratory failure   Cerebral septic emboli   Acute bacterial endocarditis  Subjective: Without complaints  Abtx:  Anti-infectives    Start     Dose/Rate Route Frequency Ordered Stop   03/03/14 1200  ampicillin (OMNIPEN) 2 g in sodium chloride 0.9 % 50 mL IVPB     2 g150 mL/hr over 20 Minutes Intravenous 6 times per day 03/03/14 0842     03/02/14 2000  vancomycin (VANCOCIN) 1,250 mg in sodium chloride 0.9 % 250 mL IVPB  Status:  Discontinued     1,250 mg166.7 mL/hr over 90 Minutes Intravenous Every 8 hours 03/02/14 1344 03/03/14 0842   03/01/14 1000  cefTRIAXone (ROCEPHIN) 2 g in dextrose 5 % 50 mL IVPB - Premix     2 g100 mL/hr over 30 Minutes Intravenous Every 12 hours 03/01/14 0907     03/01/14 0500  vancomycin (VANCOCIN) IVPB 1000 mg/200 mL premix  Status:  Discontinued     1,000 mg200 mL/hr over 60 Minutes Intravenous Every 8 hours 03/01/14 0301 03/02/14 1344   03/01/14 0400  cefTAZidime (FORTAZ) 2 g in dextrose 5 % 50 mL IVPB  Status:  Discontinued     2 g100 mL/hr over 30 Minutes Intravenous 3 times per day 03/01/14 0301 03/01/14 0907      Medications:  Scheduled: . ampicillin (OMNIPEN) IV  2 g Intravenous 6 times per day  . atorvastatin  10 mg Oral q1800  . cefTRIAXone (ROCEPHIN)  IV  2 g Intravenous Q12H  . fluticasone  2 puff Inhalation BID  . insulin aspart  0-15 Units Subcutaneous TID WC  . insulin aspart  0-5 Units Subcutaneous QHS  . insulin aspart  6 Units Subcutaneous TID WC  . insulin glargine  25 Units  Subcutaneous QHS  . lisinopril  2.5 mg Oral Daily  . metoprolol tartrate  25 mg Oral BID  . sodium chloride  10-40 mL Intracatheter Q12H    Objective: Vital signs in last 24 hours: Temp:  [97.8 F (36.6 C)-98.5 F (36.9 C)] 97.8 F (36.6 C) (12/07 0500) Pulse Rate:  [66-72] 72 (12/07 1029) Resp:  [17-18] 18 (12/07 0500) BP: (127-145)/(71-81) 145/71 mmHg (12/07 1029) SpO2:  [97 %-98 %] 98 % (12/07 0500)   General appearance: alert, cooperative and no distress Resp: clear to auscultation bilaterally Cardio: regular rate and rhythm GI: normal findings: bowel sounds normal and soft, non-tender  Lab Results  Recent Labs  03/05/14 0635 03/06/14 0603  WBC 8.9 10.7*  HGB 12.0* 12.3*  HCT 35.6* 36.4*   Liver Panel No results for input(s): PROT, ALBUMIN, AST, ALT, ALKPHOS, BILITOT, BILIDIR, IBILI in the last 72 hours. Sedimentation Rate No results for input(s): ESRSEDRATE in the last 72 hours. C-Reactive Protein No results for input(s): CRP in the last 72 hours.  Microbiology: Recent Results (from the past 240 hour(s))  Malaria smear     Status: None   Collection Time: 02/28/14  3:55 AM  Result Value Ref Range Status   Specimen Description BLOOD  Final  Special Requests Normal  Final   Malaria Prep   Final    No Plasmodium or Other Blood Parasites Seen on Thick or Thin Smears For persons strongly suspected of having a blood parasite,but have negative smears, it is recommended that blood films be repeated approximately every 12 to 24 hours for 3 consecutive  days. Performed at [facility]    Report Status 03/01/2014 FINAL  Final  Culture, blood (routine x 2)     Status: None   Collection Time: 02/28/14  9:10 AM  Result Value Ref Range Status   Specimen Description BLOOD LEFT HAND  Final   Special Requests BOTTLES DRAWN AEROBIC ONLY 5CC  Final   Culture  Setup Time   Final    02/28/2014 13:28 Performed at [facility]    Culture   Final     ENTEROCOCCUS SPECIES Note: SUSCEPTIBILITIES PERFORMED ON PREVIOUS CULTURE WITHIN THE LAST 5 DAYS. Note: Gram Stain Report Called to,Read Back By and Verified With:  CAROL MERRITT @0310  03/01/14 SMIAS Performed at [facility]    Report Status 03/03/2014 FINAL  Final  Culture, blood (routine x 2)     Status: None   Collection Time: 02/28/14  9:10 AM  Result Value Ref Range Status   Specimen Description BLOOD LEFT ARM  Final   Special Requests BOTTLES DRAWN AEROBIC AND ANAEROBIC 5CC  Final   Culture  Setup Time   Final    02/28/2014 13:28 Performed at [facility]    Culture   Final    ENTEROCOCCUS SPECIES Note: Gram Stain Report Called to,Read Back By and Verified With: MONTRESSA @0204  03/01/14 SMIAS Performed at [facility]    Report Status 03/03/2014 FINAL  Final   Organism ID, Bacteria ENTEROCOCCUS SPECIES  Final      Susceptibility   Enterococcus species - MIC*    AMPICILLIN <=2 SENSITIVE Sensitive     VANCOMYCIN 2 SENSITIVE Sensitive     * ENTEROCOCCUS SPECIES  Culture, Urine     Status: None   Collection Time: 03/01/14  5:06 AM  Result Value Ref Range Status   Specimen Description URINE, CATHETERIZED  Final   Special Requests Normal  Final   Culture  Setup Time   Final    03/01/2014 08:37 Performed at [place] Performed at [facility]   Final   Culture NO GROWTH Performed at [facility]   Final   Report Status 03/02/2014 FINAL  Final  MRSA PCR Screening     Status: None   Collection Time: 03/01/14  5:15 AM  Result Value Ref Range Status   MRSA by PCR NEGATIVE NEGATIVE Final    Comment:        The GeneXpert MRSA Assay (FDA approved for NASAL specimens only), is one component of a comprehensive MRSA colonization surveillance program. It is not intended to diagnose MRSA infection nor to guide or monitor treatment for MRSA infections.   Respiratory virus panel (routine influenza)      Status: None   Collection Time: 03/01/14  9:10 AM  Result Value Ref Range Status   Source - RVPAN NASAL SWAB  Corrected    Comment: CORRECTED ON 12/04 AT 2154: PREVIOUSLY REPORTED AS NASAL SWAB   Respiratory Syncytial Virus A NOT DETECTED  Final   Respiratory Syncytial Virus B NOT DETECTED  Final   Influenza A NOT DETECTED  Final   Influenza B NOT DETECTED  Final   Parainfluenza 1 NOT DETECTED  Final   Parainfluenza 2 NOT DETECTED  Final   Parainfluenza 3 NOT DETECTED  Final   Metapneumovirus NOT DETECTED  Final   Rhinovirus NOT DETECTED  Final   Adenovirus NOT DETECTED  Final   Influenza A H1 NOT DETECTED  Final   Influenza A H3 NOT DETECTED  Final    Comment: (NOTE)       Normal Reference Range for each Analyte: NOT DETECTED Testing performed using the Luminex xTAG Respiratory Viral Panel test kit. The analytical performance characteristics of this assay have been determined by [facility].  The modifications have not been cleared or approved by the FDA. This assay has been validated pursuant to the CLIA regulations and is used for clinical purposes. Performed at [facility], blood (routine x 2)     Status: None (Preliminary result)   Collection Time: 03/02/14 12:06 PM  Result Value Ref Range Status   Specimen Description BLOOD RIGHT ARM  Final   Special Requests BOTTLES DRAWN AEROBIC AND ANAEROBIC 10CC  Final   Culture  Setup Time   Final    03/02/2014 16:52 Performed at [facility]    Culture   Final           BLOOD CULTURE RECEIVED NO GROWTH TO DATE CULTURE WILL BE HELD FOR 5 DAYS BEFORE ISSUING A FINAL NEGATIVE REPORT Performed at [facility]    Report Status PENDING  Incomplete    Studies/Results: Mr Virgel Paling Wo Contrast  03/04/2014   CLINICAL DATA:  Shower of emboli to the brain from mitral valve endocarditis. Evaluate for mycotic aneurysm. Enterococcal bacteremia.  EXAM: MRA HEAD WITHOUT CONTRAST  TECHNIQUE:  Angiographic images of the Circle of Willis were obtained using MRA technique without intravenous contrast.  COMPARISON:  MRI brain performed 03/01/2014.  FINDINGS: Motion degraded exam results in artifactual signal loss in the carotid siphon regions.  Internal carotid arteries are widely patent based on source image examination. Projection images show artifactual misregistration and signal loss.  Basilar artery widely patent with vertebrals codominant.  No proximal stenosis of the anterior, middle, or posterior cerebral arteries. The distal MCA and PCA branches are carefully examined and there is no evidence for mycotic aneurysm. None is seen on axial source images as well.  IMPRESSION: Negative for proximal stenosis or mycotic aneurysm.   Electronically Signed   By: Rolla Flatten M.D.   On: 03/04/2014 17:43     Assessment/Plan: Enterococcal Bacteremia Spetic Emboli, CNS MV IE EF 30-35% Seizure Mental status change/encephalopathy DM2 uncontrolled Leukocytosis (steroids) Back pain Solitary kidney Recent wound infection  Total days of antibiotics: 6 amp/ceftriaxone  He appears to be doing well.  His repeat BCx are ngtd.  Would plan on 6 weeks of anbx Needs diabetic teaching (new insulin) Will have him back in ID clinic in 1 month.  Discussed with pt and his wife.  available if questions.          Bobby Rumpf Infectious Diseases (pager) [phone] [email or web address] 03/06/2014, 12:10 PM  LOS: 8 days

## 2014-03-06 NOTE — Progress Notes (Signed)
Rehab Admissions Coordinator Note:  Patient was screened by Cleatrice Burke for appropriateness for an Inpatient Acute Rehab Consult per PT recommendation. At this time, we are recommending Inpatient Rehab consult. I will contact attending MD for order.  Cleatrice Burke 03/06/2014, 8:34 AM  I can be reached at [phone].

## 2014-03-06 NOTE — Progress Notes (Signed)
CARE MANAGEMENT NOTE 03/06/2014  Patient:  [name], [name]   Account Number:  [id number]  Date Initiated:  03/02/2014  Documentation initiated by:  MAYO,HENRIETTA  Subjective/Objective Assessment:   dx endocarditis; lives with family     Action/Plan:   Anticipated DC Date:     Anticipated DC Plan:  IP [place]  CM consult      Choice offered to / List presented to:             Status of service:  In process, will continue to follow Medicare Important Message given?  NO (If response is "NO", the following Medicare IM given date fields will be blank) Date Medicare IM given:   Medicare IM given by:   Date Additional Medicare IM given:   Additional Medicare IM given by:    Discharge Disposition:    Per UR Regulation:  Reviewed for med. necessity/level of care/duration of stay  If discussed at [place] of Stay Meetings, dates discussed:    Comments:  03/06/2014 1455 NCM spoke to pt and prefers IP rehab. IP rehab process was started. Jonnie Finner RN CCM Case Mgmt phone [phone]

## 2014-03-06 NOTE — Plan of Care (Signed)
Problem: Food- and Nutrition-Related Knowledge Deficit (NB-1.1) Goal: Nutrition education Formal process to instruct or train a patient/client in a skill or to impart knowledge to help patients/clients voluntarily manage or modify food choices and eating behavior to maintain or improve health.  Outcome: Completed/Met Date Met:  03/06/14  RD consulted for nutrition education regarding diabetes.     Lab Results  Component Value Date    HGBA1C 10.3* 02/27/2014    RD provided "Carbohydrate Counting for People with Diabetes" handout from the Academy of Nutrition and Dietetics. Discussed different food groups and their effects on blood sugar, emphasizing carbohydrate-containing foods. Provided list of carbohydrates and recommended serving sizes of common foods.  Discussed importance of controlled and consistent carbohydrate intake throughout the day. Provided examples of ways to balance meals/snacks and encouraged intake of high-fiber, whole grain complex carbohydrates. Teach back method used.  Expect good compliance.  Arthur Holms, RD, LDN Pager #: [phone] After-Hours Pager #: [phone]

## 2014-03-06 NOTE — Progress Notes (Signed)
Inpatient Diabetes Program Recommendations  AACE/ADA: New Consensus Statement on Inpatient Glycemic Control (2013)  Target Ranges:  Prepandial:   less than 140 mg/dL      Peak postprandial:   less than 180 mg/dL (1-2 hours)      Critically ill patients:  140 - 180 mg/dL   Inpatient Diabetes Program Recommendations Insulin - Basal: Lantus 12 units QHS HgbA1C: 10.3% - uncontrolled Outpatient Referral: Will order OP Diabetes Education consult for uncontrolled DM  Met with patient and wife. He feels strongly that the past regimen of oral agents has not been effective. He does not speak of any obstacles to giving injections.  Wife states she gave hormone injections with an insulin pen and will be able to assist with pt taking injections. Starter kit is in the room, however the RD just finished with them, and we agreed to start the insulin teaching in the next few days.  He will watch the videos on insulin pen/injections.

## 2014-03-06 NOTE — Progress Notes (Signed)
Physical Therapy Treatment Patient Details Name: [name] MRN: [id number] DOB: 1965/09/07 Today's Date: 03/06/2014    History of Present Illness 48 year old male with history of hypertension, hyperlipidemia, diabetes mellitus, scoliosis recent removal of multiple lipomas, solitary kidney presented to the ED with intractable low back pain. On further evaluation he reported having low-grade subjective fever for 5-6 weeks with a recent history of overseas travel. Following hospitalization patient was found to be septic with high-grade fever, developed seizures with decompensation requiring intubation and mechanical ventilation. MRI of brain showed multiple small areas of acute bilateral infarct. 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    PT Comments    Pt able to increase ambulation distance today, though continues to have 8/10 low back pain.  Pt's back pain biggest limiting factor for mobility.  Still feel pt would be a great candidate for CIR to maximize independence prior to returning to home.    Follow Up Recommendations  CIR     Equipment Recommendations  Rolling walker with 5" wheels;3in1 (PT)    Recommendations for Other Services       Precautions / Restrictions Precautions Precautions: Fall;Back Precaution Booklet Issued: No Precaution Comments: Reviewed back precautions.   Restrictions Weight Bearing Restrictions: No    Mobility  Bed Mobility Overal bed mobility: Needs Assistance Bed Mobility: Sit to Sidelying         Sit to sidelying: Mod assist General bed mobility comments: A with bring Bil LEs into bed.  cues to minimize twisting.    Transfers Overall transfer level: Needs assistance Equipment used: Rolling walker (2 wheeled) Transfers: Sit to/from Stand Sit to Stand: Mod assist         General transfer comment: cues for UE use.  Height of bed had tobe elevated when returning to sitting 2/2 increased back pain.     Ambulation/Gait Ambulation/Gait assistance: Min assist Ambulation Distance (Feet): 40 Feet Assistive device: Rolling walker (2 wheeled) Gait Pattern/deviations: Step-through pattern;Decreased stride length     General Gait Details: pt leans heavilyon RW 2/2 back pain.  difficult for pt to maintain upright psoture.     Stairs            Wheelchair Mobility    Modified Rankin (Stroke Patients Only) Modified Rankin (Stroke Patients Only) Pre-Morbid Rankin Score: No symptoms Modified Rankin: Moderately severe disability     Balance Overall balance assessment: Needs assistance Sitting-balance support: Single extremity supported;Feet supported Sitting balance-Leahy Scale: Poor Sitting balance - Comments: pt uses UE support 2/2 back pain.     Standing balance support: Bilateral upper extremity supported Standing balance-Leahy Scale: Poor                      Cognition Arousal/Alertness: Awake/alert Behavior During Therapy: WFL for tasks assessed/performed Overall Cognitive Status: Within Functional Limits for tasks assessed                      Exercises      General Comments        Pertinent Vitals/Pain Pain Assessment: 0-10 Pain Score: 8  Pain Location: low back Pain Descriptors / Indicators: Aching;Constant Pain Intervention(s): Monitored during session;Premedicated before session;Repositioned    Home Living                      Prior Function            PT Goals (current goals can now be found in the care plan section)  Acute Rehab PT Goals Patient Stated Goal: less pain PT Goal Formulation: With patient Time For Goal Achievement: 03/19/14 Potential to Achieve Goals: Good Progress towards PT goals: Progressing toward goals    Frequency  Min 5X/week    PT Plan Current plan remains appropriate    Co-evaluation             End of Session Equipment Utilized During Treatment: Gait belt Activity Tolerance: Patient  limited by pain Patient left: in bed;with call bell/phone within reach     Time: 0900-0924 PT Time Calculation (min) (ACUTE ONLY): 24 min  Charges:  $Gait Training: 8-22 mins $Therapeutic Activity: 8-22 mins                    G CodesCatarina Hartshorn, [place] 03/06/2014, 9:33 AM

## 2014-03-06 NOTE — Progress Notes (Signed)
[facility] PROGRESS NOTE  [name] DOB: [birth year] DOA: 02/26/2014 PCP: Orpah Melter, MD  brief narrative 48 year old male with history of hypertension, hyperlipidemia, diabetes mellitus, scoliosis recent removal of multiple lipomas, solitary kidney presented to the ED with intractable low back pain. On further evaluation he reported having low-grade subjective fever for 5-6 weeks with a recent history of [place] travel. Following hospitalization patient was found to be septic with high-grade fever, developed seizures with decompensation requiring intubation and mechanical ventilation. MRI of brain showed multiple small areas of acute bilateral infarct. 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  Significant hospital events  11/29 added to hospitalist service with severe progressive low back pain 11/30 MRI of Lumbar Spine:Degenerative disc disease with superimposed right foraminal disc protrusion at L3-4, abutting the exiting right L3 nerve root as it courses out of the right neural foramen. This could potentially result in nerve root irritation. There is moderate to severe right with mild left foraminal narrowing at this level. Mild degenerative disc disease and facet arthrosis at L4-5 with resultant moderate right and mild left foraminal narrowing. Neurosurgery Dr Kathyrn Sheriff was consulted on the phone. Given acute infection and no significant neurological deficit recommended no acute neurosurgical intervention and have patient follow-up with him as outpatient.  12/02:  patient having high-grade fever with seizures and acute decompensation requiring emergent intubation. MRI Brain: Multiple small areas of acute infarct bilaterally, chronic micro-hemorrhage in L external capsule. Head CT unremarkable. Mild small vessel ischemic microangiopathy 2-D echo shows a large echodensity measuring 1.4 x 2 cm attached to the posterior leaflet of the mitral valve.  Anterior lealet is mildly thickened at the tip. There are mutiple jets of mitral regurgitation with at least moderate regurgitation TEE done showing A large echodensity measuring 1.4 x 2 cm is attached to the posterior leaflet of the mitral valve suspicious for endocarditis. It affects predominantly the P2 scallop but extends partially into P1 and P3 scallops. There is translucent area in the middle of the posterior leaflet suspicious of an abscess formation. No obvious perforation. Anterior lealet is mildly thickened at the tip. There are multiple jets of mitral regurgitation with at least moderate regurgitation. There is systolic flow reversal in the right upper pulmonary vein (not left upper PV). Patient seen by neurology and empirically started on treatment for meningitis. Placed on Keppra for seizures. EEG done which is consistent with generalized nonspecific cerebral dysfunction (encephalopathy) no acute seizure activity noted. ID consulted and patient placed on empiric vancomycin and Rocephin.  Cardiothoracic surgery consulted who recommended aggressive antibiotic therapy and follow-up echo with progress to therapy. If complications develop ( increase regurgitation, CHF, valve dehiscence perforation or persistent fever and bacteremia despite optimal medical treatment or if visitation increasing in size Patient needs aggressive antibiotic therapy and critical care support and follow-up echocardiogram as we progress with therapy. Should the patient develop increasing regurgitation with congestive heart failure valve dehiscence of perforation or persistent fever and bacteremia with optimal medical treatment or vegetations increasing in size would then consider mitral valve repair or replacement.    12/3. Patient extubated.  12/4 transferred to medical floor under hospitalist service. Vancomycin switched to ampicillin.     MICRO DATA: 12/01 Malaria smear >> NEG 12/01 blood >> amp sens  enterococcus 12/02 Urine >> NEG 12/02 RVP >>  12/02 blood >>     Assessment/Plan: severe sepsis secondary to Mitral valve endocarditis with embolic stroke and enterococcus bacteremia Blood cultures growing enterococcus which is sensitive to ampicillin.  Patient on empiric ampicillin and ceftriaxone. Will need 6 weeks of IV antibiotics, . PICC placed. -leucocytosis resolved. ( possibly due to infection and prednisone for back pain on admission) -no need for seizure prophylaxis. EEG unremarkable. MRA brain negative for mycotic aneurysm. carotid Doppler pending -Appreciate cardiology and thoracic surgery evaluation. Plan for aggressive medical management with antibiotics. . Repeat 2D echo today. If stable , patient will be  discharged on antibiotics and follow-up with thoracic surgery . Patient may not valve replacement if stable echo   Nonischemic Cardiomyopathy Likely in the setting of acute/subacute endocarditis. Echo showing LV dysfunction with EF of 30-35% and moderate mitral regurgitation and global hypokinesis. Continue metoprolol low-dose ACE inhibitor and Lipitor. No strict indication for ASA. will avoid given higher bleeding risk.  Type 2 diabetes mellitus, uncontrolled On Lantus and pre-meal aspart. A1c of 10.3..  Increase lantus to 25 units and premeal aspart to 6 u tid. Patient only on metformin at home. Will need insulin upon discharge. Diabetic educator  consulted  Severe low back pain Patient has scoliosis and chronic low back pain which is controlled with Tylenol alone. Admission MRI of the lumbar spine showing disc protrusion at L3-L4 with L3 nerve root pressure. No findings of discitis or abscess. Pain control with Robaxin and oxycodone. ( dose increased) . Able to ambulate short distances with a walker today but needing support. Ambulation limited due to back pain. He will follow-up with neurosurgery Dr Kathyrn Sheriff as outpt  Right calf pain No swelling or tenderness.  Check Doppler lower extremity for DVT.  Constipation Add bowel regimen  DVT prophylaxis:SCDs  Code Status: FULL  Family Communication: None at bedside today Disposition Plan: PT recommends CIR. Possible discharge tomorrow if accepted   Consultants:  Cardiology  neurology  PCCM  CTVS  Procedures:  TEE  Intubation  MRI brain/ LS Spine  MICRO DATA: 12/01 Malaria smear >> NEG 12/01 blood >> amp sens enterococcus 12/02 Urine >> NEG 12/2--repeat blood culture negative  Antibiotics:  Vanc 12/02 >> 12/04 Ceftriaxone 12/02 >>  Ampicillin 12/04 >>   HPI/Subjective: Seen and examined.Still has back pain which is improved after pain medication adjusted . limited mobility due to pain.   Objective: Filed Vitals:   03/06/14 1029  BP: 145/71  Pulse: 72  Temp:   Resp:     Intake/Output Summary (Last 24 hours) at 03/06/14 1217 Last data filed at 03/06/14 0900  Gross per 24 hour  Intake    720 ml  Output    900 ml  Net   -180 ml   Filed Weights   02/27/14 0957 03/01/14 1303 03/02/14 0500  Weight: 86.183 kg (190 lb) 87.3 kg (192 lb 7.4 oz) 93.3 kg (205 lb 11 oz)    Exam:   General: NAD  HEENT: moist mucosa  chest: clear breath sounds b/l  CVS: N S1&S2, no murmurs  Abd: soft, NT, ND   Ext: warm, no edema, RUE PICC, left lumbar paraspinal tenderness  CNS: alert and oriented, good strength and muscle tone b/l, limited mobility due to back pain  Data Reviewed: Basic Metabolic Panel:  Recent Labs Lab 02/28/14 0355 03/01/14 0509 03/01/14 0910 03/02/14 0353 03/03/14 0229  NA 134* 134* 132* 138 143  K 4.4 3.3* 4.0 4.4 3.6*  CL 96 95* 93* 101 103  CO2 24 17* 18* 22 24  GLUCOSE 273* 319* 408* 333* 173*  BUN 16 18 19 21 22   CREATININE 0.91 0.99 1.07 0.86 0.77  CALCIUM 9.1 8.7  8.7 8.5 8.9   Liver Function Tests:  Recent Labs Lab 03/01/14 0910  AST 25  ALT 34  ALKPHOS 93  BILITOT 0.9  PROT 6.7  ALBUMIN 2.4*   No results for  input(s): LIPASE, AMYLASE in the last 168 hours. No results for input(s): AMMONIA in the last 168 hours. CBC:  Recent Labs Lab 03/01/14 0509 03/01/14 0910 03/02/14 0353 03/03/14 0229 03/05/14 0635 03/06/14 0603  WBC 24.8* 21.7* 21.4* 20.7* 8.9 10.7*  NEUTROABS 23.6* 21.1*  --   --   --   --   HGB 12.1* 12.3* 11.3* 12.4* 12.0* 12.3*  HCT 35.4* 35.9* 33.6* 37.3* 35.6* 36.4*  MCV 90.1 90.9 90.6 91.0 90.1 90.8  PLT 186 168 175 186 131* 137*   Cardiac Enzymes: No results for input(s): CKTOTAL, CKMB, CKMBINDEX, TROPONINI in the last 168 hours. BNP (last 3 results) No results for input(s): PROBNP in the last 8760 hours. CBG:  Recent Labs Lab 03/05/14 1132 03/05/14 1602 03/05/14 2149 03/06/14 0550 03/06/14 1116  GLUCAP 219* 213* 202* 155* 197*    Recent Results (from the past 240 hour(s))  Malaria smear     Status: None   Collection Time: 02/28/14  3:55 AM  Result Value Ref Range Status   Specimen Description BLOOD  Final   Special Requests Normal  Final   Malaria Prep   Final    No Plasmodium or Other Blood Parasites Seen on Thick or Thin Smears For persons strongly suspected of having a blood parasite,but have negative smears, it is recommended that blood films be repeated approximately every 12 to 24 hours for 3 consecutive  days. Performed at [facility]    Report Status 03/01/2014 FINAL  Final  Culture, blood (routine x 2)     Status: None   Collection Time: 02/28/14  9:10 AM  Result Value Ref Range Status   Specimen Description BLOOD LEFT HAND  Final   Special Requests BOTTLES DRAWN AEROBIC ONLY 5CC  Final   Culture  Setup Time   Final    02/28/2014 13:28 Performed at [facility]    Culture   Final    ENTEROCOCCUS SPECIES Note: SUSCEPTIBILITIES PERFORMED ON PREVIOUS CULTURE WITHIN THE LAST 5 DAYS. Note: Gram Stain Report Called to,Read Back By and Verified With:  CAROL MERRITT @0310  03/01/14 SMIAS Performed at [facility]    Report  Status 03/03/2014 FINAL  Final  Culture, blood (routine x 2)     Status: None   Collection Time: 02/28/14  9:10 AM  Result Value Ref Range Status   Specimen Description BLOOD LEFT ARM  Final   Special Requests BOTTLES DRAWN AEROBIC AND ANAEROBIC 5CC  Final   Culture  Setup Time   Final    02/28/2014 13:28 Performed at [facility]    Culture   Final    ENTEROCOCCUS SPECIES Note: Gram Stain Report Called to,Read Back By and Verified With: MONTRESSA @0204  03/01/14 SMIAS Performed at [facility]    Report Status 03/03/2014 FINAL  Final   Organism ID, Bacteria ENTEROCOCCUS SPECIES  Final      Susceptibility   Enterococcus species - MIC*    AMPICILLIN <=2 SENSITIVE Sensitive     VANCOMYCIN 2 SENSITIVE Sensitive     * ENTEROCOCCUS SPECIES  Culture, Urine     Status: None   Collection Time: 03/01/14  5:06 AM  Result Value Ref Range Status   Specimen Description URINE, CATHETERIZED  Final   Special  Requests Normal  Final   Culture  Setup Time   Final    03/01/2014 08:37 Performed at [address] Performed at [facility]   Final   Culture NO GROWTH Performed at [facility]   Final   Report Status 03/02/2014 FINAL  Final  MRSA PCR Screening     Status: None   Collection Time: 03/01/14  5:15 AM  Result Value Ref Range Status   MRSA by PCR NEGATIVE NEGATIVE Final    Comment:        The GeneXpert MRSA Assay (FDA approved for NASAL specimens only), is one component of a comprehensive MRSA colonization surveillance program. It is not intended to diagnose MRSA infection nor to guide or monitor treatment for MRSA infections.   Respiratory virus panel (routine influenza)     Status: None   Collection Time: 03/01/14  9:10 AM  Result Value Ref Range Status   Source - RVPAN NASAL SWAB  Corrected    Comment: CORRECTED ON 12/04 AT 2154: PREVIOUSLY REPORTED AS NASAL SWAB   Respiratory Syncytial Virus A NOT DETECTED   Final   Respiratory Syncytial Virus B NOT DETECTED  Final   Influenza A NOT DETECTED  Final   Influenza B NOT DETECTED  Final   Parainfluenza 1 NOT DETECTED  Final   Parainfluenza 2 NOT DETECTED  Final   Parainfluenza 3 NOT DETECTED  Final   Metapneumovirus NOT DETECTED  Final   Rhinovirus NOT DETECTED  Final   Adenovirus NOT DETECTED  Final   Influenza A H1 NOT DETECTED  Final   Influenza A H3 NOT DETECTED  Final    Comment: (NOTE)       Normal Reference Range for each Analyte: NOT DETECTED Testing performed using the Luminex xTAG Respiratory Viral Panel test kit. The analytical performance characteristics of this assay have been determined by [facility].  The modifications have not been cleared or approved by the FDA. This assay has been validated pursuant to the CLIA regulations and is used for clinical purposes. Performed at [facility], blood (routine x 2)     Status: None (Preliminary result)   Collection Time: 03/02/14 12:06 PM  Result Value Ref Range Status   Specimen Description BLOOD RIGHT ARM  Final   Special Requests BOTTLES DRAWN AEROBIC AND ANAEROBIC 10CC  Final   Culture  Setup Time   Final    03/02/2014 16:52 Performed at [facility]    Culture   Final           BLOOD CULTURE RECEIVED NO GROWTH TO DATE CULTURE WILL BE HELD FOR 5 DAYS BEFORE ISSUING A FINAL NEGATIVE REPORT Performed at [facility]    Report Status PENDING  Incomplete     Studies: Mr Virgel Paling Wo Contrast  03/04/2014   CLINICAL DATA:  Shower of emboli to the brain from mitral valve endocarditis. Evaluate for mycotic aneurysm. Enterococcal bacteremia.  EXAM: MRA HEAD WITHOUT CONTRAST  TECHNIQUE: Angiographic images of the Circle of Willis were obtained using MRA technique without intravenous contrast.  COMPARISON:  MRI brain performed 03/01/2014.  FINDINGS: Motion degraded exam results in artifactual signal loss in the carotid siphon regions.  Internal  carotid arteries are widely patent based on source image examination. Projection images show artifactual misregistration and signal loss.  Basilar artery widely patent with vertebrals codominant.  No proximal stenosis of the anterior, middle, or  posterior cerebral arteries. The distal MCA and PCA branches are carefully examined and there is no evidence for mycotic aneurysm. None is seen on axial source images as well.  IMPRESSION: Negative for proximal stenosis or mycotic aneurysm.   Electronically Signed   By: Rolla Flatten M.D.   On: 03/04/2014 17:43    Scheduled Meds: . ampicillin (OMNIPEN) IV  2 g Intravenous 6 times per day  . atorvastatin  10 mg Oral q1800  . cefTRIAXone (ROCEPHIN)  IV  2 g Intravenous Q12H  . fluticasone  2 puff Inhalation BID  . insulin aspart  0-15 Units Subcutaneous TID WC  . insulin aspart  0-5 Units Subcutaneous QHS  . insulin aspart  6 Units Subcutaneous TID WC  . insulin glargine  25 Units Subcutaneous QHS  . lisinopril  2.5 mg Oral Daily  . metoprolol tartrate  25 mg Oral BID  . sodium chloride  10-40 mL Intracatheter Q12H   Continuous Infusions:     Time spent: 25 minutes    Clora Ohmer  [facility] Pager [phone]. If 7PM-7AM, please contact night-coverage at [email or web address], password [facility] 03/06/2014, 12:17 PM  LOS: 8 days

## 2014-03-06 NOTE — Progress Notes (Signed)
  Echocardiogram 2D Echocardiogram has been performed.  Diamond Nickel 03/06/2014, 2:56 PM

## 2014-03-06 NOTE — Progress Notes (Signed)
VASCULAR LAB PRELIMINARY  PRELIMINARY  PRELIMINARY  PRELIMINARY  Bilateral lower extremity venous duplex  completed.    Preliminary report:  Bilateral:  No evidence of DVT, superficial thrombosis, or Baker's Cyst.    Bradie Lacock, RVT 03/06/2014, 5:04 PM

## 2014-03-06 NOTE — Consult Note (Signed)
Physical Medicine and Rehabilitation Consult Reason for Consult: CVA Referring Physician: [facility]   HPI: [name] is a 48 y.o.right handed male with history of diabetes mellitus, hypertension and scoliosis. Independent prior to admission living with his wife and 92-year-old son. Admitted 02/27/2014 with intractable low back pain 4 days denying any recent trauma as well as fever of 105 rectally and rigors. Patient works for [employer] travels internationally recently returning from [place]. MRI lumbar spine with no acute abnormalities noted degenerative disc changes L3-4. Sedimentation rate 56. Patient developed seizures with decompensation requiring intubation mechanical ventilation. MRI showed small areas of acute infarct bilaterally consistent with cerebral emboli. MRA of the head negative. Echocardiogram ejection fraction 35% with grade 1 diastolic dysfunction. Carotid Dopplers no ICA stenosis. Infectious disease consulted for workup of fever TEE completed suggested mitral valve endocarditis. Blood cultures gram-positive cocci in chains. Cardiothoracic surgery Dr. Servando Snare consulted no surgical intervention at this time. Patient currently maintained on Rocephin as well as ampicillin 6 weeks. EEG consistent with nonspecific cerebral dysfunction encephalopathy. Patient was later extubated slow progressive gains with therapy evaluation completed 03/05/2014 with recommendations of physical medicine rehabilitation consult.   Review of Systems  Constitutional: Positive for fever.  Musculoskeletal: Positive for back pain.  All other systems reviewed and are negative.  Past Medical History  Diagnosis Date  . Diabetes mellitus without complication     type 2  . Hyperlipidemia   . Allergic rhinitis   . lipoma   . H/O scoliosis   . Hypertension     Diagnostic exercise tolerance test assessment:04/30/2010 : comments normal -no evidence os ischemia by ST analysis  . Solitary kidney   . Wilm's  tumor (nephroblastoma)    Past Surgical History  Procedure Laterality Date  . Lipoma excision    . Total nephrectomy Left   . Thyroid surgery    . Knee surgery     Family History  Problem Relation Age of Onset  . Heart disease Father    Social History:  reports that he has never smoked. He has never used smokeless tobacco. He reports that he drinks alcohol. He reports that he does not use illicit drugs. Allergies: No Known Allergies Medications Prior to Admission  Medication Sig Dispense Refill  . acetaminophen (TYLENOL) 500 MG tablet Take 1,000 mg by mouth every 6 (six) hours as needed for moderate pain.    Marland Kitchen albuterol (PROVENTIL HFA;VENTOLIN HFA) 108 (90 BASE) MCG/ACT inhaler Inhale 1 puff into the lungs every 6 (six) hours as needed for wheezing or shortness of breath.    Marland Kitchen aspirin EC 81 MG tablet Take 81 mg by mouth daily.    Marland Kitchen atorvastatin (LIPITOR) 10 MG tablet Take 10 mg by mouth daily.    . diclofenac sodium (VOLTAREN) 1 % GEL Apply 2 g topically 4 (four) times daily.    . hydrochlorothiazide (HYDRODIURIL) 25 MG tablet Take 25 mg by mouth daily.    Marland Kitchen lisinopril (PRINIVIL,ZESTRIL) 20 MG tablet Take 20 mg by mouth daily.    . Magnesium Salicylate Tetrahyd (DOANS EXTRA STRENGTH) 580 MG TABS Take 2 tablets by mouth every 6 (six) hours as needed (pain).    . metFORMIN (GLUCOPHAGE) 500 MG tablet Take 500 mg by mouth 2 (two) times daily with a meal.    . mometasone (ASMANEX) 220 MCG/INH inhaler Inhale 1 puff into the lungs daily.    . repaglinide (PRANDIN) 0.5 MG tablet Take 0.5 mg by mouth 2 (two) times daily before  a meal.      Home: Home Living Family/patient expects to be discharged to:: Private residence Living Arrangements: Spouse/significant other Available Help at Discharge: Family, Available PRN/intermittently Type of Home: House Home Access: Stairs to enter Technical brewer of Steps: 5 Entrance Stairs-Rails: None Home Layout: Two level, Able to live on main  level with bedroom/bathroom (6yo son's room upstairs) Alternate Level Stairs-Number of Steps: flight Alternate Level Stairs-Rails: Right Home Equipment: None  Functional History: Prior Function Level of Independence: Independent Functional Status:  Mobility: Bed Mobility Overal bed mobility: Needs Assistance Bed Mobility: Rolling, Sidelying to Sit Rolling: Mod assist Sidelying to sit: Mod assist General bed mobility comments: cues for log roll technique; mod assist to clear feet from EOB and elevate trunk to sitting Transfers Overall transfer level: Needs assistance Equipment used: Rolling walker (2 wheeled) Transfers: Sit to/from Stand Sit to Stand: Mod assist General transfer comment: Cues for technqiue, safety, and hand palcement Ambulation/Gait Ambulation/Gait assistance: Min assist Ambulation Distance (Feet): 8 Feet Assistive device: Rolling walker (2 wheeled) Gait Pattern/deviations: Step-to pattern, Decreased stride length Gait velocity: quite slow General Gait Details: Cues for body position in relation to RW; noted heavy dependence on UE support on RW    ADL:    Cognition: Cognition Overall Cognitive Status: Within Functional Limits for tasks assessed Orientation Level: Oriented X4 Cognition Arousal/Alertness: Awake/alert Behavior During Therapy: WFL for tasks assessed/performed Overall Cognitive Status: Within Functional Limits for tasks assessed  Blood pressure 127/79, pulse 72, temperature 97.8 F (36.6 C), temperature source Oral, resp. rate 18, height 6' (1.829 m), weight 93.3 kg (205 lb 11 oz), SpO2 98 %. Physical Exam  Constitutional: He is oriented to person, place, and time. He appears well-developed.  HENT:  Head: Normocephalic.  Eyes: EOM are normal.  Neck: Normal range of motion. Neck supple. No thyromegaly present.  Cardiovascular: Normal rate and regular rhythm.   Respiratory: Effort normal and breath sounds normal. No respiratory distress.    GI: Soft. Bowel sounds are normal. He exhibits no distension.  Neurological: He is alert and oriented to person, place, and time.  Skin: Skin is warm and dry.    Results for orders placed or performed during the hospital encounter of 02/26/14 (from the past 24 hour(s))  Glucose, capillary     Status: Abnormal   Collection Time: 03/05/14 11:32 AM  Result Value Ref Range   Glucose-Capillary 219 (H) 70 - 99 mg/dL  Glucose, capillary     Status: Abnormal   Collection Time: 03/05/14  4:02 PM  Result Value Ref Range   Glucose-Capillary 213 (H) 70 - 99 mg/dL  Glucose, capillary     Status: Abnormal   Collection Time: 03/05/14  9:49 PM  Result Value Ref Range   Glucose-Capillary 202 (H) 70 - 99 mg/dL  Glucose, capillary     Status: Abnormal   Collection Time: 03/06/14  5:50 AM  Result Value Ref Range   Glucose-Capillary 155 (H) 70 - 99 mg/dL  CBC     Status: Abnormal   Collection Time: 03/06/14  6:03 AM  Result Value Ref Range   WBC 10.7 (H) 4.0 - 10.5 K/uL   RBC 4.01 (L) 4.22 - 5.81 MIL/uL   Hemoglobin 12.3 (L) 13.0 - 17.0 g/dL   HCT 36.4 (L) 39.0 - 52.0 %   MCV 90.8 78.0 - 100.0 fL   MCH 30.7 26.0 - 34.0 pg   MCHC 33.8 30.0 - 36.0 g/dL   RDW 12.6 11.5 - 15.5 %  Platelets 137 (L) 150 - 400 K/uL   Mr Virgel Paling Wo Contrast  03/04/2014   CLINICAL DATA:  Shower of emboli to the brain from mitral valve endocarditis. Evaluate for mycotic aneurysm. Enterococcal bacteremia.  EXAM: MRA HEAD WITHOUT CONTRAST  TECHNIQUE: Angiographic images of the Circle of Willis were obtained using MRA technique without intravenous contrast.  COMPARISON:  MRI brain performed 03/01/2014.  FINDINGS: Motion degraded exam results in artifactual signal loss in the carotid siphon regions.  Internal carotid arteries are widely patent based on source image examination. Projection images show artifactual misregistration and signal loss.  Basilar artery widely patent with vertebrals codominant.  No proximal stenosis of  the anterior, middle, or posterior cerebral arteries. The distal MCA and PCA branches are carefully examined and there is no evidence for mycotic aneurysm. None is seen on axial source images as well.  IMPRESSION: Negative for proximal stenosis or mycotic aneurysm.   Electronically Signed   By: Rolla Flatten M.D.   On: 03/04/2014 17:43   Dg Chest Port 1 View  03/04/2014   CLINICAL DATA:  Right PICC line placement  EXAM: PORTABLE CHEST - 1 VIEW  COMPARISON:  03/03/2014  FINDINGS: Right PICC line tip lower SVC level. Stable cardiomegaly with vascular congestion. No focal pneumonia, collapse or consolidation. Negative for edema, effusion or pneumothorax. Trachea midline. Improved edema pattern compared to yesterday.  IMPRESSION: Right PICC line tip lower SVC level.  Cardiomegaly with improving edema.   Electronically Signed   By: Daryll Brod M.D.   On: 03/04/2014 12:29    Assessment/Plan: Diagnosis: septic emboli to the brain from mitral valve endocarditis 1. Does the need for close, 24 hr/day medical supervision in concert with the patient's rehab needs make it unreasonable for this patient to be served in a less intensive setting? Yes 2. Co-Morbidities requiring supervision/potential complications: severe low back pain, lumbar spondylosis 3. Due to bladder management, bowel management, safety, skin/wound care, disease management, medication administration, pain management and patient education, does the patient require 24 hr/day rehab nursing? Yes 4. Does the patient require coordinated care of a physician, rehab nurse, PT (1-2 hrs/day, 5 days/week) and OT (1-2 hrs/day, 5 days/week) to address physical and functional deficits in the context of the above medical diagnosis(es)? Yes Addressing deficits in the following areas: balance, endurance, locomotion, strength, transferring, bowel/bladder control, bathing, dressing, feeding, grooming, toileting and pain mgt 5. Can the patient actively participate in  an intensive therapy program of at least 3 hrs of therapy per day at least 5 days per week? Yes 6. The potential for patient to make measurable gains while on inpatient rehab is excellent 7. Anticipated functional outcomes upon discharge from inpatient rehab are modified independent  with PT, modified independent with OT, n/a with SLP. 8. Estimated rehab length of stay to reach the above functional goals is: 7-10  days 9. Does the patient have adequate social supports and living environment to accommodate these discharge functional goals? Yes 10. Anticipated D/C setting: Home 11. Anticipated post D/C treatments: HH therapy and Outpatient therapy 12. Overall Rehab/Functional Prognosis: excellent  RECOMMENDATIONS: This patient's condition is appropriate for continued rehabilitative care in the following setting: CIR Patient has agreed to participate in recommended program. Yes Note that insurance prior authorization may be required for reimbursement for recommended care.  Comment: Spent extensive time reviewing Mr. Bromell' MRI. His back is more of a functional problem than anything else at present. Recommend aggressive OOB, pain mgt for now. Rehab Admissions Coordinator  to follow up.  Thanks,  Meredith Staggers, MD, Mellody Drown     03/06/2014

## 2014-03-06 NOTE — Progress Notes (Signed)
Inpatient Diabetes Program Recommendations  AACE/ADA: New Consensus Statement on Inpatient Glycemic Control (2013)  Target Ranges:  Prepandial:   less than 140 mg/dL      Peak postprandial:   less than 180 mg/dL (1-2 hours)      Critically ill patients:  140 - 180 mg/dL   Consult received for patient new to insulin. Assume this is for discharge teaching. Have ordered an insulin starter - RN to teach insulin administration using videos, starter kit and handouts. Ordered RD consult for nutrition teaching as well. Will talk with patient about starting insulin and care notes on basic survival skills.  Thank you, Ann Clark, RN, CNS, Diabetes Coordinator ([phone])     

## 2014-03-07 ENCOUNTER — Inpatient Hospital Stay (HOSPITAL_COMMUNITY): Payer: BC Managed Care – PPO

## 2014-03-07 DIAGNOSIS — M4646 Discitis, unspecified, lumbar region: Secondary | ICD-10-CM

## 2014-03-07 DIAGNOSIS — Z905 Acquired absence of kidney: Secondary | ICD-10-CM

## 2014-03-07 DIAGNOSIS — G9340 Encephalopathy, unspecified: Secondary | ICD-10-CM | POA: Diagnosis not present

## 2014-03-07 DIAGNOSIS — B952 Enterococcus as the cause of diseases classified elsewhere: Secondary | ICD-10-CM | POA: Diagnosis present

## 2014-03-07 DIAGNOSIS — R7881 Bacteremia: Secondary | ICD-10-CM

## 2014-03-07 DIAGNOSIS — M4626 Osteomyelitis of vertebra, lumbar region: Secondary | ICD-10-CM | POA: Diagnosis present

## 2014-03-07 DIAGNOSIS — M4640 Discitis, unspecified, site unspecified: Secondary | ICD-10-CM | POA: Diagnosis present

## 2014-03-07 LAB — GLUCOSE, CAPILLARY
Glucose-Capillary: 133 mg/dL — ABNORMAL HIGH (ref 70–99)
Glucose-Capillary: 139 mg/dL — ABNORMAL HIGH (ref 70–99)
Glucose-Capillary: 135 mg/dL — ABNORMAL HIGH (ref 70–99)
Glucose-Capillary: 144 mg/dL — ABNORMAL HIGH (ref 70–99)

## 2014-03-07 MED ORDER — SODIUM CHLORIDE 0.9 % IV SOLN: 2.0000 g | INTRAVENOUS | Status: AC

## 2014-03-07 MED ORDER — INSULIN ASPART 100 UNIT/ML ~~LOC~~ SOLN: 6.0000 [IU] | Freq: Three times a day (TID) | SUBCUTANEOUS | Status: DC

## 2014-03-07 MED ORDER — LORAZEPAM 2 MG/ML IJ SOLN
2.0000 mg | Freq: Once | INTRAMUSCULAR | Status: AC
  Administered 2014-03-07: 2 mg via INTRAVENOUS
  Filled 2014-03-07: qty 1

## 2014-03-07 MED ORDER — METFORMIN HCL 1000 MG PO TABS: 1000.0000 mg | ORAL_TABLET | Freq: Two times a day (BID) | ORAL | Status: DC

## 2014-03-07 MED ORDER — METHOCARBAMOL 500 MG PO TABS: 500.0000 mg | ORAL_TABLET | Freq: Four times a day (QID) | ORAL | Status: DC | PRN

## 2014-03-07 MED ORDER — METOPROLOL TARTRATE 25 MG PO TABS: 25.0000 mg | ORAL_TABLET | Freq: Two times a day (BID) | ORAL | Status: DC

## 2014-03-07 MED ORDER — INSULIN GLARGINE 100 UNIT/ML ~~LOC~~ SOLN: 25.0000 [IU] | Freq: Every day | SUBCUTANEOUS | Status: DC

## 2014-03-07 MED ORDER — OXYCODONE-ACETAMINOPHEN 5-325 MG PO TABS: 2.0000 | ORAL_TABLET | ORAL | Status: DC | PRN

## 2014-03-07 MED ORDER — LISINOPRIL 2.5 MG PO TABS: 2.5000 mg | ORAL_TABLET | Freq: Every day | ORAL | Status: DC

## 2014-03-07 MED ORDER — CEFTRIAXONE SODIUM IN DEXTROSE 40 MG/ML IV SOLN: 2.0000 g | Freq: Two times a day (BID) | INTRAVENOUS | Status: AC

## 2014-03-07 NOTE — Plan of Care (Signed)
Problem: Consults Goal: General Medical Patient Education See Patient Education Module for specific education.  Outcome: Completed/Met Date Met:  03/07/14     

## 2014-03-07 NOTE — Evaluation (Signed)
Occupational Therapy Evaluation Patient Details Name: [name] MRN: [id number] DOB: [birth year] Today's Date: 03/07/2014    History of Present Illness 48 year old male with history of hypertension, hyperlipidemia, diabetes mellitus, scoliosis recent removal of multiple lipomas, solitary kidney presented to the ED with intractable low back pain. On further evaluation he reported having low-grade subjective fever for 5-6 weeks with a recent history of overseas travel. Following hospitalization patient was found to be septic with high-grade fever, developed seizures with decompensation requiring intubation and mechanical ventilation. MRI of brain showed multiple small areas of acute bilateral infarct. 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 He demonstrates the below listed deficits and will benefit from continued OT to maximize safety and independence with BADLs.  Pt presents to OT with low back pain 9-10/10 with activity.  He requires max - total A with BADLs due to severity of pain and generalized weakness.   Cognition appears to be [facility] for basic tasks, but pt functioned at very high level PTA, and will benefit from further assessment to ensure cognition is intact.   Wife is very supportive and pt is very motivated.  Recommend CIR.       Follow Up Recommendations  CIR    Equipment Recommendations  3 in 1 bedside comode;Tub/shower seat    Recommendations for Other Services       Precautions / Restrictions Precautions Precautions: Fall;Back Precaution Booklet Issued: No Restrictions Weight Bearing Restrictions: No      Mobility Bed Mobility Overal bed mobility: Needs Assistance Bed Mobility: Rolling;Sidelying to Sit;Sit to Sidelying Rolling: Min assist Sidelying to sit: Mod assist      Sit to sidelying: Mod assist General bed mobility comments: heavily dependent upon bed rail.  Pt moves slowly due to pain.  requires assist for LEs and to raise trunk from bed   Transfers Overall transfer level: Needs assistance Equipment used: Rolling walker (2 wheeled) Transfers: Sit to/from [facility] Sit to Stand: Min assist Stand pivot transfers: Min assist       General transfer comment: Pt requires increased time and assist to move sit to stand and to pivot.      Balance Overall balance assessment: Needs assistance Sitting-balance support: Feet supported;Bilateral upper extremity supported Sitting balance-Leahy Scale: Poor Sitting balance - Comments: heavily reliant on bil. UEs   Standing balance support: Bilateral upper extremity supported Standing balance-Leahy Scale: Poor Standing balance comment: heavily reliant on bil. UEs                            ADL Overall ADL's : Needs assistance/impaired Eating/Feeding: Set up;Bed level   Grooming: Wash/dry hands;Wash/dry face;Oral care;Brushing hair;Applying deodorant;Set up;Bed level   Upper Body Bathing: Minimal assitance;Bed level   Lower Body Bathing: Maximal assistance;Sit to/from stand;Bed level Lower Body Bathing Details (indicate cue type and reason): Pt unable to unweight unilateral UE to perform ADLs in seated position due to severity of back pain Upper Body Dressing : Total assistance;Sitting   Lower Body Dressing: Total assistance;Sit to/from stand Lower Body Dressing Details (indicate cue type and reason): unable to access bil. LEs due to increased pain  Toilet Transfer: Minimal assistance;Ambulation;Comfort height toilet;BSC Toilet Transfer Details (indicate cue type and reason): Moves slowly and in guarded fashion due to pain.  Requires signficant amount  of time.  Toileting- Clothing Manipulation and Hygiene: Total assistance;Sit to/from stand       Functional mobility  during ADLs: Minimal assistance;Rolling walker General ADL Comments: Pt limited by severity of back pain.  He is very motivated     Physiological scientist: Within Advertising copywriter Alignment/Gaze Preference: Within Defined Limits Ocular Range of Motion: Within Functional Limits Tracking/Visual Pursuits: Able to track stimulus in all quads without difficulty Saccades: Decreased speed of saccadic movement (mildly slower to the Rt. ) Convergence: Within functional limits         Perception Perception Perception Tested?: Yes   Praxis Praxis Praxis tested?: Within functional limits    Pertinent Vitals/Pain Pain Assessment: 0-10 Pain Score: 7  Pain Location: low back  Pain Descriptors / Indicators: Aching;Grimacing;Guarding;Sharp;Shooting;Throbbing Pain Intervention(s): Limited activity within patient's tolerance;Monitored during session;Repositioned;Patient requesting pain meds-RN notified;RN gave pain meds during session     Hand Dominance Right   Extremity/Trunk Assessment Upper Extremity Assessment Upper Extremity Assessment: Overall WFL for tasks assessed   Lower Extremity Assessment Lower Extremity Assessment: Defer to PT evaluation   Cervical / Trunk Assessment Cervical / Trunk Assessment: Normal   Communication Communication Communication: No difficulties   Cognition Arousal/Alertness: Awake/alert Behavior During Therapy: WFL for tasks assessed/performed Overall Cognitive Status: Within Functional Limits for tasks assessed (basic cognition appears intact)                     General Comments       Exercises       Shoulder Instructions      Home Living Family/patient expects to be discharged to:: Private residence Living Arrangements: Spouse/significant other Available Help at Discharge: Family;Available PRN/intermittently Type of Home: House Home Access: Stairs to enter [employer] of Steps: 5 Entrance Stairs-Rails: None Home Layout:  Two level;Able to live on main level with bedroom/bathroom Alternate Level Stairs-Number of Steps: flight Alternate Level Stairs-Rails: Right Bathroom Shower/Tub: Occupational psychologist: Standard     Home Equipment: None          Prior Functioning/Environment Level of Independence: Independent        Comments: Pt works for [employer] in [place] and travels world wide frequently.      OT Diagnosis: Generalized weakness;Acute pain   OT Problem List: Decreased strength;Decreased activity tolerance;Impaired balance (sitting and/or standing);Decreased knowledge of use of DME or AE;Decreased knowledge of precautions;Pain   OT Treatment/Interventions: Self-care/ADL training;DME and/or AE instruction;Therapeutic activities;Cognitive remediation/compensation;Patient/family education;Balance training    OT Goals(Current goals can be found in the care plan section) Acute Rehab OT Goals Patient Stated Goal: to get better OT Goal Formulation: With patient/family Time For Goal Achievement: 03/21/14 Potential to Achieve Goals: Good ADL Goals Pt Will Perform Grooming: with supervision;standing Pt Will Perform Upper Body Bathing: with set-up;with supervision;sitting (unsupported sitting ) Pt Will Perform Lower Body Bathing: with supervision;with adaptive equipment;sit to/from stand Pt Will Perform Upper Body Dressing: with supervision;with set-up;sitting (unsupported sitting ) Pt Will Perform Lower Body Dressing: with supervision;with adaptive equipment;sit to/from stand Pt Will Transfer to Toilet: with supervision;ambulating;bedside commode;regular height toilet;grab bars Pt Will Perform Toileting - Clothing Manipulation and hygiene: with supervision;sit to/from stand Additional ADL Goal #1: Pt will demonstrate ability to perform complex multi tasking activities independently   OT Frequency: Min 2X/week   Barriers to D/C:            Co-evaluation               End  of Session Equipment Utilized During Treatment: Surveyor, mining Communication: Mobility status;Patient requests pain meds  Activity Tolerance: Patient limited by pain Patient left: in bed;with call bell/phone within reach;with family/visitor present   Time: 9628-3662 OT Time Calculation (min): 44 min Charges:  OT General Charges $OT Visit: 1 Procedure OT Evaluation $Initial OT Evaluation Tier I: 1 Procedure OT Treatments $Therapeutic Activity: 23-37 mins G-Codes:    Michaelpaul Apo M 04-06-2014, 1:06 PM

## 2014-03-07 NOTE — Clinical Social Work Note (Signed)
Met with patient and his wife to discuss back up plan if patient can not go to inpatient rehab.  Discussed the process of a patient going to a SNF for rehab, and explained that sometimes insurance companies will not authorize patient for inpatient rehab.  Informed patient and wife of facilities in [place], and gave a list of SNFs as a back up.  CSW will continue to follow in case patient's insurance is not approved.  Jutras Broom. Napoleon, MSW, [place] 03/07/2014 4:43 PM

## 2014-03-07 NOTE — Plan of Care (Signed)
Problem: Phase I Progression Outcomes Goal: OOB as tolerated unless otherwise ordered Outcome: Completed/Met Date Met:  03/07/14 Goal: Initial discharge plan identified Outcome: Completed/Met Date Met:  03/07/14

## 2014-03-07 NOTE — Discharge Summary (Addendum)
Physician Discharge Summary  [name] CXK:[id number] DOB: 1966-03-25 DOA: 02/26/2014  PCP: Orpah Melter, MD  Admit date: 02/26/2014 Discharge date: 03/07/2014  Time spent: >35 minutes  Recommendations for Outpatient Follow-up:  1. Discharge to inpatient rehab 2. Stop date of IV abx 01/ 16/2016 3. Follow up with cardiology, ID and thoracic surgery  as outpt. Follow up repeat echo from 12/7 4. 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  Active Problems:   Lumbar Diskitis   HYPERTENSION, BENIGN   Intractable back pain   DM (diabetes mellitus), type 2, uncontrolled with hyperglycemia   Hyperlipidemia   Cardiomyopathy- EF 30-35%   CVA (cerebral infarction)- embolic   Acute respiratory failure   Enterococcal bacteremia   Osteomyelitis of lumbar vertebra   Solitary kidney, acquired   Acute encephalopathy   Discharge Condition: Fair  Diet recommendation: Diabetic `/Heart healthy  Filed Weights   02/27/14 0957 03/01/14 1303 03/02/14 0500  Weight: 86.183 kg (190 lb) 87.3 kg (192 lb 7.4 oz) 93.3 kg (205 lb 11 oz)    History of present illness:  Please refer to admission H&P for details, but in brief, 48 year old male with history of hypertension, hyperlipidemia, diabetes mellitus, scoliosis recent removal of multiple lipomas, solitary kidney presented to the ED with intractable low back pain. On further evaluation he reported having low-grade subjective fever for 5-6 weeks with a recent history of [place] travel. Following hospitalization patient was found to be septic with high-grade fever, developed seizures with decompensation requiring intubation and mechanical ventilation. MRI of brain showed multiple small areas of acute bilateral infarct. 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  Hospital Course:  Significant hospital  events  11/29 added to hospitalist service with severe progressive low back pain 11/30 MRI of Lumbar Spine:Degenerative disc disease with superimposed right foraminal disc protrusion at L3-4, abutting the exiting right L3 nerve root as it courses out of the right neural foramen. This could potentially result in nerve root irritation. There is moderate to severe right with mild left foraminal narrowing at this level. Mild degenerative disc disease and facet arthrosis at L4-5 with resultant moderate right and mild left foraminal narrowing. Neurosurgery Dr Kathyrn Sheriff was consulted on the phone. Given acute infection and no significant neurological deficit recommended no acute neurosurgical intervention and have patient follow-up with him as outpatient.  12/02:  patient having high-grade fever with seizures and acute decompensation requiring emergent intubation. MRI Brain: Multiple small areas of acute infarct bilaterally, chronic micro-hemorrhage in L external capsule. Head CT unremarkable. Mild small vessel ischemic microangiopathy 2-D echo shows a large echodensity measuring 1.4 x 2 cm attached to the posterior leaflet of the mitral valve. Anterior lealet is mildly thickened at the tip. There are mutiple jets of mitral regurgitation with at least moderate regurgitation TEE done showing A large echodensity measuring 1.4 x 2 cm is attached to the posterior leaflet of the mitral valve suspicious for endocarditis. It affects predominantly the P2 scallop but extends partially into P1 and P3 scallops. There is translucent area in the middle of the posterior leaflet suspicious of an abscess formation. No obvious perforation. Anterior lealet is mildly thickened at the tip. There are multiple jets of mitral regurgitation with at least moderate regurgitation. There is systolic flow reversal in the right upper pulmonary vein (not left upper PV). Patient seen by neurology and empirically started on  treatment for meningitis. Placed on  Keppra for seizures. EEG done which is consistent with generalized nonspecific cerebral dysfunction (encephalopathy) no acute seizure activity noted. ID consulted and patient placed on empiric vancomycin and Rocephin.  Cardiothoracic surgery consulted who recommended aggressive antibiotic therapy and follow-up echo with progress to therapy. If complications develop ( increase regurgitation, CHF, valve dehiscence perforation or persistent fever and bacteremia despite optimal medical treatment or if visitation increasing in size Patient needs aggressive antibiotic therapy and critical care support and follow-up echocardiogram as we progress with therapy. Should the patient develop increasing regurgitation with congestive heart failure valve dehiscence of perforation or persistent fever and bacteremia with optimal medical treatment or vegetations increasing in size would then consider mitral valve repair or replacement.    12/3. Patient extubated.  12/4 transferred to medical floor under hospitalist service. Vancomycin switched to ampicillin.     MICRO DATA: 12/01 Malaria smear >> NEG 12/01 blood >> amp sens enterococcus 12/02 Urine >> NEG 12/02 RVP >>  12/02 blood >>     Assessment/Plan: severe sepsis secondary to Mitral valve endocarditis with embolic stroke and enterococcus bacteremia Blood cultures growing enterococcus which is sensitive to ampicillin. Patient on empiric ampicillin and ceftriaxone. Will need 6 weeks of IV antibiotics, . PICC placed. -leucocytosis resolved. ( possibly due to infection and prednisone for back pain on admission).  -no need for seizure prophylaxis. EEG unremarkable. MRA brain negative for mycotic aneurysm.  -Appreciate cardiology and thoracic surgery evaluation. Plan for aggressive medical management with antibiotics. . Repeat 2D echo on 12/7. Results discussed with cardiologist. REPEAT echocardiogram shows improvement  int he LVEF to 65% from 35%. His MR remains at moderate to severe. Discussed with Dr Gwenlyn Found.  patient will be discharged on antibiotics and follow-up with thoracic surgery as outpatient as recommended.    Nonischemic Cardiomyopathy Likely in the setting of acute/subacute endocarditis. Echo showing LV dysfunction with EF of 30-35% and moderate mitral regurgitation and global hypokinesis. Continue metoprolol, ACE inhibitor and Lipitor. No strict indication for ASA. will avoid given higher bleeding risk.  Type 2 diabetes mellitus, uncontrolled On Lantus and pre-meal aspart. A1c of 10.3.. Increase lantus to 25 units and premeal aspart to 6 u tid.  Patient only on metformin at home. increase dose to 1000 mg bid.  Diabetic educator consulted and outpt follow up provided. CBG (last 3)   Recent Labs  03/07/14 1635 03/07/14 2203 03/08/14 0617  GLUCAP 135* 144* 82      Severe low back pain secondary to L3-4 diskitis / osteomyelitis   Admission MRI of the lumbar spine showing disc protrusion at L3-L4 with L3 nerve root pressure. No findings of discitis or abscess. Pain control with Robaxin and oxycodone. ( dose increased) . Able to ambulate short distances with a walker  but needing support. Ambulation limited due to back pain.  Repeat MRI of the lumbar spine w/o contrast done on 12/8 showing early diskitis vs osteomyelitis over L3-L4 with adjacent right paraspinal edema. D/w ID. No further recommendations. Should continue empiric abx. Low  yield in obtaining fluid for culture.   He can follow-up with neurosurgery Dr Kathyrn Sheriff as outpt.  Right calf pain  lower extremity negative for DVT. Improved.   Constipation Added bowel regimen     Consultants:  Cardiology  neurology  PCCM  CTVS  Procedures:  TEE  Intubation  MRI brain/ LS Spine  MICRO DATA: 12/01 Malaria smear >> NEG 12/01 blood >> amp sens enterococcus 12/02 Urine >> NEG 12/2--repeat blood culture  negative  Antibiotics:  Vanc 12/02 >> 12/04 Ceftriaxone 12/02 >> stop date 04/15/14 Ampicillin 12/04 >> stop date 04/15/14    Discharge Exam: Filed Vitals:   03/07/14 0531  BP: 128/84  Pulse: 62  Temp: 98.4 F (36.9 C)  Resp: 18     General: NAD  HEENT: moist mucosa  chest: clear breath sounds b/l  CVS: N S1&S2, no murmurs  Abd: soft, NT, ND   Ext: warm, no edema, RUE PICC, left lumbar paraspinal tenderness  CNS: alert and oriented, good strength and muscle tone b/l, limited mobility due to back pain  Discharge Instructions You were cared for by a hospitalist during your hospital stay. If you have any questions about your discharge medications or the care you received while you were in the hospital after you are discharged, you can call the unit and asked to speak with the hospitalist on call if the hospitalist that took care of you is not available. Once you are discharged, your primary care physician will handle any further medical issues. Please note that NO REFILLS for any discharge medications will be authorized once you are discharged, as it is imperative that you return to your primary care physician (or establish a relationship with a primary care physician if you do not have one) for your aftercare needs so that they can reassess your need for medications and monitor your lab values.  Discharge Instructions    Ambulatory referral to Nutrition and Diabetic Education    Complete by:  As directed           Current Discharge Medication List    START taking these medications   Details  ampicillin 2 g in sodium chloride 0.9 % 50 mL Inject 2 g into the vein every 4 (four) hours. Qty: 36 drop, Refills: 0    cefTRIAXone (ROCEPHIN) 40 MG/ML IVPB Inject 50 mLs (2 g total) into the vein every 12 (twelve) hours. Qty: 50 mL, Refills: 0    diazepam (VALIUM) 5 MG tablet Take 1 tablet (5 mg total) by mouth 2 (two) times daily. As needed for muscle spasms Qty: 20  tablet, Refills: 0    insulin aspart (NOVOLOG) 100 UNIT/ML injection Inject 6 Units into the skin 3 (three) times daily with meals. Qty: 10 mL, Refills: 11    insulin glargine (LANTUS) 100 UNIT/ML injection Inject 0.25 mLs (25 Units total) into the skin at bedtime. Qty: 10 mL, Refills: 11    methocarbamol (ROBAXIN) 500 MG tablet Take 1 tablet (500 mg total) by mouth every 6 (six) hours as needed for muscle spasms. Qty: 30 tablet, Refills: 0    metoprolol tartrate (LOPRESSOR) 25 MG tablet Take 1 tablet (25 mg total) by mouth 2 (two) times daily. Qty: 60 tablet, Refills: 0    oxyCODONE-acetaminophen (PERCOCET) 5-325 MG per tablet Take 2 tablets by mouth every 4 (four) hours as needed for moderate pain. Qty: 30 tablet, Refills: 0      CONTINUE these medications which have CHANGED   Details  metFORMIN (GLUCOPHAGE) 1000 MG tablet Take 1 tablet (1,000 mg total) by mouth 2 (two) times daily with a meal. Qty: 60 tablet, Refills: 0      CONTINUE these medications which have NOT CHANGED   Details  acetaminophen (TYLENOL) 500 MG tablet Take 1,000 mg by mouth every 6 (six) hours as needed for moderate pain.    albuterol (PROVENTIL HFA;VENTOLIN HFA) 108 (90 BASE) MCG/ACT inhaler Inhale 1 puff into the lungs every 6 (six) hours as needed for wheezing  or shortness of breath.    aspirin EC 81 MG tablet Take 81 mg by mouth daily.    atorvastatin (LIPITOR) 10 MG tablet Take 10 mg by mouth daily.    diclofenac sodium (VOLTAREN) 1 % GEL Apply 2 g topically 4 (four) times daily.    hydrochlorothiazide (HYDRODIURIL) 25 MG tablet Take 25 mg by mouth daily.    lisinopril (PRINIVIL,ZESTRIL) 20 MG tablet Take 20 mg by mouth daily.    Magnesium Salicylate Tetrahyd (DOANS EXTRA STRENGTH) 580 MG TABS Take 2 tablets by mouth every 6 (six) hours as needed (pain).    mometasone (ASMANEX) 220 MCG/INH inhaler Inhale 1 puff into the lungs daily.    repaglinide (PRANDIN) 0.5 MG tablet Take 0.5 mg by mouth 2  (two) times daily before a meal.       No Known Allergies Follow-up Information    Follow up with Orpah Melter, MD.   Specialty:  Family Medicine   Contact information:   [place] [address] [phone]       Follow up with Peter Martinique, MD In 2 weeks.   Specialty:  Cardiology   Contact information:   [address] [place] [address] [phone]       Follow up with Carlyle Basques, MD In 2 weeks.   Specialty:  Infectious Diseases   Why:  office will call   Contact information:   [place] [address] [phone]       Follow up with GERHARDT,EDWARD B, MD In 4 weeks.   Specialty:  Cardiothoracic Surgery   Why:  office  will call   Contact information:   [address] [place] [address] [phone]        The results of significant diagnostics from this hospitalization (including imaging, microbiology, ancillary and laboratory) are listed below for reference.    Significant Diagnostic Studies: Dg Chest 2 View  02/28/2014   CLINICAL DATA:  Fever, history diabetes, hyperlipidemia, hypertension, scoliosis  EXAM: CHEST  2 VIEW  COMPARISON:  01/02/2009  FINDINGS: Lordotic AP positioning.  Normal heart size, mediastinal contours and pulmonary vascularity for technique.  Minimal chronic central peribronchial thickening and hyperinflation.  No definite infiltrate, pleural effusion or pneumothorax.  Bones demineralized.  IMPRESSION: No acute abnormalities.  Resolution of previously seen RIGHT upper lobe infiltrate.   Electronically Signed   By: Lavonia Dana M.D.   On: 02/28/2014 12:05   Dg Lumbar Spine Complete  02/26/2014   CLINICAL DATA:  low back pain with limited movement yesterday morning after trying to get out of bedPt had no traumaPt also states he has a hx of scoliosis  EXAM: LUMBAR SPINE - COMPLETE 4+ VIEW  COMPARISON:  None.  FINDINGS: There is a lumbar dextroscoliosis apex  L2. Mild narrowing of the L3-4 interspace. Anterior endplate spurring L89-Q1. Negative for fracture or dislocation. Normal mineralization.  IMPRESSION: 1. Negative for fracture or other acute bone abnormality. 2. Lumbar dextroscoliosis with associated degenerative changes as above.   Electronically Signed   By: Arne Cleveland M.D.   On: 02/26/2014 13:55   Ct Head Wo Contrast  03/01/2014   CLINICAL DATA:  Became unresponsive less than 1 hour ago. Looking constantly to the left. Initial encounter.  EXAM: CT HEAD WITHOUT CONTRAST  TECHNIQUE: Contiguous axial images were obtained from the base of the skull through the vertex without intravenous contrast.  COMPARISON:  None.  FINDINGS: There  is no evidence of acute infarction, mass lesion, or intra- or extra-axial hemorrhage on CT.  Mild periventricular white matter change likely reflects small vessel ischemic microangiopathy.  The posterior fossa, including the cerebellum, brainstem and fourth ventricle, is within normal limits. The third and lateral ventricles, and basal ganglia are unremarkable in appearance. The cerebral hemispheres are symmetric in appearance, with normal gray-white differentiation. No mass effect or midline shift is seen.  There is no evidence of fracture; visualized osseous structures are unremarkable in appearance. The orbits are within normal limits. The paranasal sinuses and mastoid air cells are well-aerated. No significant soft tissue abnormalities are seen.  IMPRESSION: 1. No acute intracranial pathology seen on CT. 2. Mild small vessel ischemic microangiopathy.   Electronically Signed   By: Garald Balding M.D.   On: 03/01/2014 04:47   Mr Jodene Nam Head Wo Contrast  03/04/2014   CLINICAL DATA:  Shower of emboli to the brain from mitral valve endocarditis. Evaluate for mycotic aneurysm. Enterococcal bacteremia.  EXAM: MRA HEAD WITHOUT CONTRAST  TECHNIQUE: Angiographic images of the Circle of Willis were obtained using MRA technique without  intravenous contrast.  COMPARISON:  MRI brain performed 03/01/2014.  FINDINGS: Motion degraded exam results in artifactual signal loss in the carotid siphon regions.  Internal carotid arteries are widely patent based on source image examination. Projection images show artifactual misregistration and signal loss.  Basilar artery widely patent with vertebrals codominant.  No proximal stenosis of the anterior, middle, or posterior cerebral arteries. The distal MCA and PCA branches are carefully examined and there is no evidence for mycotic aneurysm. None is seen on axial source images as well.  IMPRESSION: Negative for proximal stenosis or mycotic aneurysm.   Electronically Signed   By: Rolla Flatten M.D.   On: 03/04/2014 17:43   Mr [name] ZO Contrast  03/01/2014   CLINICAL DATA:  Seizure. Right sided visual neglect. Transient alteration of awareness  EXAM: MRI HEAD WITHOUT AND WITH CONTRAST  TECHNIQUE: Multiplanar, multiecho pulse sequences of the brain and surrounding structures were obtained without and with intravenous contrast.  CONTRAST:  81m MULTIHANCE GADOBENATE DIMEGLUMINE 529 MG/ML IV SOLN  COMPARISON:  CT head 03/01/2014  FINDINGS: The patient was sedated with Ativan however remained agitated and had difficulty holding still. There is progressive motion on the study and the patient was not able to complete the final postcontrast coronal sequence.  Multiple areas of acute infarct are noted. Small areas are restricted diffusion are present bilaterally including the right cerebellum, left occipital pole, left medial occipital lobe, left parietal lobe, right parietal periventricular white matter, head of caudate on the left. This pattern suggests multiple small emboli.  Ventricle size is normal. No cortical infarct. Cerebral volume is normal.  Microhemorrhage in the left external capsule. Question history of hypertension. No other areas of hemorrhage.  Postcontrast images degraded by motion however no  enhancing lesions are identified.  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  Chronic microhemorrhage in the left external capsule. Question hypertension   Electronically Signed   By: CFranchot GalloM.D.   On: 03/01/2014 08:07   Mr Lumbar Spine Wo Contrast  03/07/2014   CLINICAL DATA:  Scoliosis. Low back pain. This has worsened over the past 4 days.  EXAM: MRI LUMBAR SPINE WITHOUT CONTRAST  TECHNIQUE: Multiplanar, multisequence MR imaging of the lumbar spine was performed. No intravenous contrast was administered.  COMPARISON:  02/27/2014  FINDINGS:  The lowest lumbar type non-rib-bearing vertebra is labeled as L5. The conus medullaris appears normal. Conus level: L1-2.  The left kidney is absent with amorphous tissue in the expected left renal fossa probably reflecting bowel.  T 1 hypointense 2.2 by 1.4 cm lesion in the sacrum at the S1 level, technically nonspecific, but given the speckled appearance on axial images this is probably an atypical hemangioma.  Fluid signal in the narrow intervertebral disc at L3-4 with edema along the vertebral endplates at this level, similar to prior. There may be some low-level paravertebral edema on the right, as on image 16 of series 7. I do not observe an obvious abnormal epidural fluid collection in this area.  Dextroconvex lumbar scoliosis with significant rotary component.  Additional findings at individual levels are as follows:  L1-2:  No impingement.  Mild disc bulge.  L2-3:  No impingement.  Mild disc bulge.  L3-4: Moderate right foraminal stenosis due to facet arthropathy, moderate right and mild left foraminal stenosis due to intervertebral spurring, facet spurring, right foraminal disc protrusion, and underlying disc bulge. Left facet joint effusion noted.  L4-5: Moderate bilateral foraminal stenosis due to disc bulge and facet arthropathy.  L5-S1: Mild right foraminal stenosis due to  intervertebral spurring, facet spurring, and minimal disc bulge.  IMPRESSION: 1. Possible early discitis-osteomyelitis at L3-4. Adjacent right paraspinal edema at this level. Correlate with symptoms in determining the utility of disc aspiration. 2. Lumbar spondylosis and degenerative disc disease cause moderate impingement at L3-4 and L4-5, and mild impingement at L5-S1, as detailed above. This impingement is not appreciably changed compared to the prior exam. 3. Dextroconvex lumbar scoliosis with significant rotary component. 4. Suspected atypical hemangioma in the S1 level of the sacrum. 5. Absent left kidney.   Electronically Signed   By: Sherryl Barters M.D.   On: 03/07/2014 10:56   Mr Lumbar Spine W Wo Contrast  02/27/2014   CLINICAL DATA:  Intractable back pain.  History of scoliosis.  EXAM: MRI LUMBAR SPINE WITHOUT AND WITH CONTRAST  TECHNIQUE: Multiplanar and multiecho pulse sequences of the lumbar spine were obtained without and with intravenous contrast.  CONTRAST:  51m MULTIHANCE GADOBENATE DIMEGLUMINE 529 MG/ML IV SOLN  COMPARISON:  Prior radiograph from 02/26/2014.  FINDINGS: For the purposes of this dictation, the lowest well-formed intervertebral disc spaces presumed to be the L5-S1 level, and there presumed to be 5 lumbar type vertebral bodies.  Dextroscoliosis of the lumbar spine with apex at L2 is present. Vertebral bodies are otherwise normally aligned with preservation of the normal lumbar lordosis. Vertebral body heights are maintained. No acute fracture.  Lobular 2.1 cm T1 hypointense, T2/STIR hyperintense nonenhancing lesion within the central aspect of the sacrum is noted, likely a benign hemangioma. No other focal osseous lesion.  The conus medullaris terminates normally at the L1 level. Signal intensity within the visualized cord is within normal limits. Nerve roots of the cauda equina are unremarkable.  Paraspinous soft tissues are within normal limits.  No abnormal enhancement  identified within the lumbar spine.  T10-11: Seen only on sagittal projection. No disc bulge or disc protrusion. No canal or foraminal stenosis.  T11-12: Seen only on sagittal projection. No disc bulge or disc protrusion. No canal or foraminal stenosis.  T12-L1: No disc bulge or disc protrusion. No significant facet arthropathy. No canal or foraminal stenosis.  L1-2: Mild disc desiccation without significant disc bulge or disc protrusion. No significant canal or foraminal stenosis. No significant facet arthrosis.  L2-3: Mild  diffuse disc bulge with disc desiccation. No focal disc herniation. Thecal sac remains widely patent without significant canal stenosis. No significant foraminal narrowing.  L3-4: Degenerative intervertebral disc space narrowing with disc bulge and disc desiccation. There are associated degenerative endplate changes with chronic Schmorl's node present at the anterior aspect of the inferior endplate of L3. Degenerative endplate spurring present anteriorly. There is a superimposed right foraminal shallow disc protrusion, closely approximating/abutting the exiting right L3 nerve root as it courses out of the right neural foramen (series 7, image 22). There is an associated annular fissure. Mild bilateral facet arthrosis present with asymmetric fluid density present within the left L3-4 facet. Findings are likely related too scoliotic curvature of the lumbar spine, most prevalent at this level. The thecal sac remains widely patent without significant stenosis. There is moderate to severe right with mild left foraminal stenosis.  L4-5: Mild diffuse disc bulge with disc desiccation. No focal disc herniation. Moderate right facet arthrosis. No significant canal narrowing. There is mild left with moderate right foraminal stenosis.  L5-S1: Mild disc desiccation without disc bulge or disc protrusion. No significant facet arthrosis. No canal or foraminal stenosis.  IMPRESSION: 1. No acute abnormality or  abnormal enhancement within the lumbar spine. 2. Dextroscoliosis of the lumbar spine with apex at L2. 3. Degenerative disc disease with superimposed right foraminal disc protrusion at L3-4, abutting the exiting right L3 nerve root as it courses out of the right neural foramen. This could potentially result in nerve root irritation. There is moderate to severe right with mild left foraminal narrowing at this level. 4. Mild degenerative disc disease and facet arthrosis at L4-5 with resultant moderate right and mild left foraminal narrowing.   Electronically Signed   By: Jeannine Boga M.D.   On: 02/27/2014 22:56   Dg Chest Port 1 View  03/04/2014   CLINICAL DATA:  Right PICC line placement  EXAM: PORTABLE CHEST - 1 VIEW  COMPARISON:  03/03/2014  FINDINGS: Right PICC line tip lower SVC level. Stable cardiomegaly with vascular congestion. No focal pneumonia, collapse or consolidation. Negative for edema, effusion or pneumothorax. Trachea midline. Improved edema pattern compared to yesterday.  IMPRESSION: Right PICC line tip lower SVC level.  Cardiomegaly with improving edema.   Electronically Signed   By: Daryll Brod M.D.   On: 03/04/2014 12:29   Dg Chest Port 1 View  03/03/2014   CLINICAL DATA:  Respiratory failure  EXAM: PORTABLE CHEST - 1 VIEW  COMPARISON:  12/3/fifth  FINDINGS: Cardiomediastinal silhouette is stable. Endotracheal and NG tube has been removed. Residual mild interstitial prominence bilateral suspicious for residual mild interstitial edema. No segmental infiltrate. No pneumothorax.  IMPRESSION: NG tube and endotracheal tube has been removed. Residual mild interstitial prominence suspicious for mild residual interstitial edema. No segmental infiltrate. No pneumothorax.   Electronically Signed   By: Lahoma Crocker M.D.   On: 03/03/2014 08:52   Dg Chest Port 1 View  03/02/2014   CLINICAL DATA:  Follow-up of respiratory failure  EXAM: PORTABLE CHEST - 1 VIEW  COMPARISON:  Portable chest x-rays  of March 01, 2014.  FINDINGS: The lungs are well-expanded. There is no focal infiltrate. The interstitial markings remain minimally prominent. The cardiac silhouette is top-normal in size. The pulmonary vascularity is less engorged today. There is no pleural effusion or pneumothorax.  The endotracheal tube tip lies 4.2 cm above the crotch of the carina. The esophagogastric tube tip projects below the expected location of the GE junction.  IMPRESSION: Further improvement in the appearance of the pulmonary interstitium and central pulmonary vascularity is consistent with ongoing resolution of pulmonary interstitial edema. There is no evidence of pneumonia. The support tubes and lines are in appropriate position.   Electronically Signed   By: David  Martinique   On: 03/02/2014 07:45   Dg Chest Port 1 View  03/01/2014   CLINICAL DATA:  ET tube placement.  Fever and infection.  EXAM: PORTABLE CHEST - 1 VIEW  COMPARISON:  03/01/2014  FINDINGS: Endotracheal tube terminates 5.3 cm above carina. Nasogastric terminates of bodies stomach with side port just below the gastroesophageal junction.  Mild cardiomegaly. No pleural effusion or pneumothorax. improved pulmonary interstitial prominence. No lobar consolidation. Extends beyond the inferior aspect of the film.  IMPRESSION: Improved to resolved interstitial prominence. Favored to represent resolving pulmonary edema.  Appropriate position of endotracheal and nasogastric tubes.   Electronically Signed   By: Abigail Miyamoto M.D.   On: 03/01/2014 09:26   Dg Chest Port 1 View  03/01/2014   CLINICAL DATA:  Acute onset of fever. Seizures. Unresponsive. Initial encounter.  EXAM: PORTABLE CHEST - 1 VIEW  COMPARISON:  Chest radiograph performed 02/28/2014  FINDINGS: The lungs are hypoexpanded. Vascular congestion is noted, with increased interstitial markings, concerning for mild interstitial edema. Alternatively, an atypical infectious process might have a similar appearance, new  from the prior study. There is no evidence of pleural effusion or pneumothorax.  The cardiomediastinal silhouette is borderline enlarged. No acute osseous abnormalities are seen.  IMPRESSION: Lungs hypoexpanded. Vascular congestion and borderline cardiomegaly noted. Increased interstitial markings raise question for mild interstitial edema. Alternatively, an atypical infectious process might have a similar appearance, new from the prior study.   Electronically Signed   By: Garald Balding M.D.   On: 03/01/2014 05:50    Microbiology: Recent Results (from the past 240 hour(s))  Malaria smear     Status: None   Collection Time: 02/28/14  3:55 AM  Result Value Ref Range Status   Specimen Description BLOOD  Final   Special Requests Normal  Final   Malaria Prep   Final    No Plasmodium or Other Blood Parasites Seen on Thick or Thin Smears For persons strongly suspected of having a blood parasite,but have negative smears, it is recommended that blood films be repeated approximately every 12 to 24 hours for 3 consecutive  days. Performed at [facility]    Report Status 03/01/2014 FINAL  Final  Culture, blood (routine x 2)     Status: None   Collection Time: 02/28/14  9:10 AM  Result Value Ref Range Status   Specimen Description BLOOD LEFT HAND  Final   Special Requests BOTTLES DRAWN AEROBIC ONLY 5CC  Final   Culture  Setup Time   Final    02/28/2014 13:28 Performed at [facility]    Culture   Final    ENTEROCOCCUS SPECIES Note: SUSCEPTIBILITIES PERFORMED ON PREVIOUS CULTURE WITHIN THE LAST 5 DAYS. Note: Gram Stain Report Called to,Read Back By and Verified With:  CAROL MERRITT @0310  03/01/14 SMIAS Performed at [facility]    Report Status 03/03/2014 FINAL  Final  Culture, blood (routine x 2)     Status: None   Collection Time: 02/28/14  9:10 AM  Result Value Ref Range Status   Specimen Description BLOOD LEFT ARM  Final   Special Requests BOTTLES DRAWN AEROBIC AND  ANAEROBIC 5CC  Final   Culture  Setup Time  Final    02/28/2014 13:28 Performed at [facility]    Culture   Final    ENTEROCOCCUS SPECIES Note: Gram Stain Report Called to,Read Back By and Verified With: MONTRESSA @0204  03/01/14 SMIAS Performed at [facility]    Report Status 03/03/2014 FINAL  Final   Organism ID, Bacteria ENTEROCOCCUS SPECIES  Final      Susceptibility   Enterococcus species - MIC*    AMPICILLIN <=2 SENSITIVE Sensitive     VANCOMYCIN 2 SENSITIVE Sensitive     * ENTEROCOCCUS SPECIES  Culture, Urine     Status: None   Collection Time: 03/01/14  5:06 AM  Result Value Ref Range Status   Specimen Description URINE, CATHETERIZED  Final   Special Requests Normal  Final   Culture  Setup Time   Final    03/01/2014 08:37 Performed at [place] Performed at [facility]   Final   Culture NO GROWTH Performed at [facility]   Final   Report Status 03/02/2014 FINAL  Final  MRSA PCR Screening     Status: None   Collection Time: 03/01/14  5:15 AM  Result Value Ref Range Status   MRSA by PCR NEGATIVE NEGATIVE Final    Comment:        The GeneXpert MRSA Assay (FDA approved for NASAL specimens only), is one component of a comprehensive MRSA colonization surveillance program. It is not intended to diagnose MRSA infection nor to guide or monitor treatment for MRSA infections.   Respiratory virus panel (routine influenza)     Status: None   Collection Time: 03/01/14  9:10 AM  Result Value Ref Range Status   Source - RVPAN NASAL SWAB  Corrected    Comment: CORRECTED ON 12/04 AT 2154: PREVIOUSLY REPORTED AS NASAL SWAB   Respiratory Syncytial Virus A NOT DETECTED  Final   Respiratory Syncytial Virus B NOT DETECTED  Final   Influenza A NOT DETECTED  Final   Influenza B NOT DETECTED  Final   Parainfluenza 1 NOT DETECTED  Final   Parainfluenza 2 NOT DETECTED  Final   Parainfluenza 3 NOT DETECTED   Final   Metapneumovirus NOT DETECTED  Final   Rhinovirus NOT DETECTED  Final   Adenovirus NOT DETECTED  Final   Influenza A H1 NOT DETECTED  Final   Influenza A H3 NOT DETECTED  Final    Comment: (NOTE)       Normal Reference Range for each Analyte: NOT DETECTED Testing performed using the Luminex xTAG Respiratory Viral Panel test kit. The analytical performance characteristics of this assay have been determined by [facility].  The modifications have not been cleared or approved by the FDA. This assay has been validated pursuant to the CLIA regulations and is used for clinical purposes. Performed at [facility], blood (routine x 2)     Status: None (Preliminary result)   Collection Time: 03/02/14 12:06 PM  Result Value Ref Range Status   Specimen Description BLOOD RIGHT ARM  Final   Special Requests BOTTLES DRAWN AEROBIC AND ANAEROBIC 10CC  Final   Culture  Setup Time   Final    03/02/2014 16:52 Performed at [facility]    Culture   Final           BLOOD CULTURE RECEIVED NO GROWTH TO DATE CULTURE WILL BE HELD FOR 5 DAYS BEFORE ISSUING A FINAL NEGATIVE REPORT  Performed at [facility]    Report Status PENDING  Incomplete     Labs: Basic Metabolic Panel:  Recent Labs Lab 03/01/14 0509 03/01/14 0910 03/02/14 0353 03/03/14 0229  NA 134* 132* 138 143  K 3.3* 4.0 4.4 3.6*  CL 95* 93* 101 103  CO2 17* 18* 22 24  GLUCOSE 319* 408* 333* 173*  BUN 18 19 21 22   CREATININE 0.99 1.07 0.86 0.77  CALCIUM 8.7 8.7 8.5 8.9   Liver Function Tests:  Recent Labs Lab 03/01/14 0910  AST 25  ALT 34  ALKPHOS 93  BILITOT 0.9  PROT 6.7  ALBUMIN 2.4*   No results for input(s): LIPASE, AMYLASE in the last 168 hours. No results for input(s): AMMONIA in the last 168 hours. CBC:  Recent Labs Lab 03/01/14 0509 03/01/14 0910 03/02/14 0353 03/03/14 0229 03/05/14 0635 03/06/14 0603  WBC 24.8* 21.7* 21.4* 20.7* 8.9 10.7*  NEUTROABS  23.6* 21.1*  --   --   --   --   HGB 12.1* 12.3* 11.3* 12.4* 12.0* 12.3*  HCT 35.4* 35.9* 33.6* 37.3* 35.6* 36.4*  MCV 90.1 90.9 90.6 91.0 90.1 90.8  PLT 186 168 175 186 131* 137*   Cardiac Enzymes: No results for input(s): CKTOTAL, CKMB, CKMBINDEX, TROPONINI in the last 168 hours. BNP: BNP (last 3 results) No results for input(s): PROBNP in the last 8760 hours. CBG:  Recent Labs Lab 03/06/14 1116 03/06/14 1642 03/06/14 2103 03/07/14 0819 03/07/14 1117  GLUCAP 197* 140* 165* 133* 139*       Signed: Hosie Poisson, MD [facility] 03/07/2014, 2:29 PM

## 2014-03-07 NOTE — Plan of Care (Signed)
Problem: Phase I Progression Outcomes Goal: Other Phase I Outcomes/Goals Outcome: Completed/Met Date Met:  03/07/14     

## 2014-03-07 NOTE — Progress Notes (Signed)
Have attempted to teach patient and/or wife to use the insulin pen. Introduced yesterday but pt was overwhelmed with his heart and back issue. Went back today and this afternoon, but wife stated he was sleeping and she was busy on the phone. Pt most probably going to rehab tomorrow. Will follow pt and assess teaching needs and f/up with using the pen.  Thank you, Rosita Kea, RN, CNS, Diabetes Coordinator [phone])

## 2014-03-07 NOTE — Plan of Care (Signed)
Problem: Phase III Progression Outcomes Goal: Pain controlled on oral analgesia Outcome: Completed/Met Date Met:  03/07/14 Goal: Activity at appropriate level-compared to baseline (UP IN CHAIR FOR HEMODIALYSIS)  Outcome: Completed/Met Date Met:  03/07/14 Goal: Voiding independently Outcome: Completed/Met Date Met:  03/07/14

## 2014-03-07 NOTE — Progress Notes (Signed)
PT Cancellation Note  Patient Details Name: [name] MRN: [id number] DOB: 08/20/1965   Cancelled Treatment:    Reason Eval/Treat Not Completed: Pain limiting ability to participate. Pt just received pain medication. Will re-attempt later today.    Blondell Reveal Kistler 03/07/2014, 12:39 PM  [phone]

## 2014-03-07 NOTE — Progress Notes (Addendum)
I await OT eval to begin insurance precertification for an inpt rehab admission. I will follow up today. [phone] I met with pt and wife at bedside. They are in agreement to admission to inpt rehab once insurance has given approval which will likely be tomorrow.

## 2014-03-07 NOTE — Progress Notes (Signed)
Utilization review completed.  

## 2014-03-07 NOTE — Progress Notes (Signed)
Subjective:  No CP/SOB  Objective:  Temp:  [98.1 F (36.7 C)-98.4 F (36.9 C)] 98.4 F (36.9 C) (12/08 0531) Pulse Rate:  [62-71] 62 (12/08 0531) Resp:  [18] 18 (12/08 0531) BP: (128-135)/(83-85) 128/84 mmHg (12/08 0531) SpO2:  [97 %-98 %] 97 % (12/08 0531) Weight change:   Intake/Output from previous day: 12/07 0701 - 12/08 0700 In: 720 [P.O.:720] Out: 2275 [Urine:2275]  Intake/Output from this shift:    Physical Exam: General appearance: alert and no distress Neck: no adenopathy, no carotid bruit, no JVD, supple, symmetrical, trachea midline and thyroid not enlarged, symmetric, no tenderness/mass/nodules Lungs: clear to auscultation bilaterally Heart: regular rate and rhythm, S1, S2 normal, no murmur, click, rub or gallop Extremities: extremities normal, atraumatic, no cyanosis or edema  Lab Results: Results for orders placed or performed during the hospital encounter of 02/26/14 (from the past 48 hour(s))  Glucose, capillary     Status: Abnormal   Collection Time: 03/05/14  4:02 PM  Result Value Ref Range   Glucose-Capillary 213 (H) 70 - 99 mg/dL  Glucose, capillary     Status: Abnormal   Collection Time: 03/05/14  9:49 PM  Result Value Ref Range   Glucose-Capillary 202 (H) 70 - 99 mg/dL  Glucose, capillary     Status: Abnormal   Collection Time: 03/06/14  5:50 AM  Result Value Ref Range   Glucose-Capillary 155 (H) 70 - 99 mg/dL  CBC     Status: Abnormal   Collection Time: 03/06/14  6:03 AM  Result Value Ref Range   WBC 10.7 (H) 4.0 - 10.5 K/uL   RBC 4.01 (L) 4.22 - 5.81 MIL/uL   Hemoglobin 12.3 (L) 13.0 - 17.0 g/dL   HCT 36.4 (L) 39.0 - 52.0 %   MCV 90.8 78.0 - 100.0 fL   MCH 30.7 26.0 - 34.0 pg   MCHC 33.8 30.0 - 36.0 g/dL   RDW 12.6 11.5 - 15.5 %   Platelets 137 (L) 150 - 400 K/uL  Glucose, capillary     Status: Abnormal   Collection Time: 03/06/14 11:16 AM  Result Value Ref Range   Glucose-Capillary 197 (H) 70 - 99 mg/dL   Comment 1  Documented in Chart    Comment 2 Notify RN   Glucose, capillary     Status: Abnormal   Collection Time: 03/06/14  4:42 PM  Result Value Ref Range   Glucose-Capillary 140 (H) 70 - 99 mg/dL  Glucose, capillary     Status: Abnormal   Collection Time: 03/06/14  9:03 PM  Result Value Ref Range   Glucose-Capillary 165 (H) 70 - 99 mg/dL  Glucose, capillary     Status: Abnormal   Collection Time: 03/07/14  8:19 AM  Result Value Ref Range   Glucose-Capillary 133 (H) 70 - 99 mg/dL  Glucose, capillary     Status: Abnormal   Collection Time: 03/07/14 11:17 AM  Result Value Ref Range   Glucose-Capillary 139 (H) 70 - 99 mg/dL    Imaging: Imaging results have been reviewed  Tele:NSR 65  Assessment/Plan:   1. Principal Problem: 2.   Acute endocarditis 3. Active Problems: 4.   HYPERTENSION, BENIGN 5.   Family history of ischemic heart disease 6.   Intractable back pain 7.   Hyperglycemia 8.   DM (diabetes mellitus), type 2, uncontrolled 9.   Hyperlipidemia 10.   Gram-positive bacteremia 11.   Severe sepsis 12.   Change in mental status 13.   Cardiomyopathy- EF  30-35% 14.   CVA (cerebral infarction)- embolic 15.   Acute respiratory failure 16.   Cerebral septic emboli 17.   Acute bacterial endocarditis 18.   Time Spent Directly with Patient:  15 minutes  Length of Stay:  LOS: 9 days   Admitted with SBE. Confirmed by 2D and BC. P2 Veg with moderate MR and moderate LV dysfunction. On approp meds including ATBX via PICC Line for 6 weeks as OP per ID. Had repeat 2D as Vasc US to R/O DVT. Results pending. Will review 2D and s/o after that. Will arrange OP F/U after D/C   Najah Liverman J 03/07/2014, 12:00 PM

## 2014-03-08 ENCOUNTER — Inpatient Hospital Stay (HOSPITAL_COMMUNITY)
Admission: RE | Admit: 2014-03-08 | Discharge: 2014-03-15 | DRG: 552 | Disposition: A | Discharge status: Home / Self Care | Payer: BC Managed Care – PPO | Source: Intra-hospital | Attending: Physical Medicine & Rehabilitation | Admitting: Physical Medicine & Rehabilitation

## 2014-03-08 ENCOUNTER — Encounter (HOSPITAL_COMMUNITY): Payer: Self-pay | Admitting: *Deleted

## 2014-03-08 ENCOUNTER — Other Ambulatory Visit: Payer: Self-pay | Admitting: Physician Assistant

## 2014-03-08 DIAGNOSIS — R7881 Bacteremia: Secondary | ICD-10-CM | POA: Diagnosis present

## 2014-03-08 DIAGNOSIS — Z79899 Other long term (current) drug therapy: Secondary | ICD-10-CM | POA: Diagnosis not present

## 2014-03-08 DIAGNOSIS — I76 Septic arterial embolism: Secondary | ICD-10-CM | POA: Diagnosis not present

## 2014-03-08 DIAGNOSIS — M4646 Discitis, unspecified, lumbar region: Secondary | ICD-10-CM | POA: Diagnosis present

## 2014-03-08 DIAGNOSIS — I10 Essential (primary) hypertension: Secondary | ICD-10-CM | POA: Diagnosis present

## 2014-03-08 DIAGNOSIS — I669 Occlusion and stenosis of unspecified cerebral artery: Secondary | ICD-10-CM | POA: Diagnosis not present

## 2014-03-08 DIAGNOSIS — Z7982 Long term (current) use of aspirin: Secondary | ICD-10-CM | POA: Diagnosis not present

## 2014-03-08 DIAGNOSIS — M545 Low back pain: Secondary | ICD-10-CM | POA: Diagnosis present

## 2014-03-08 DIAGNOSIS — E785 Hyperlipidemia, unspecified: Secondary | ICD-10-CM | POA: Diagnosis present

## 2014-03-08 DIAGNOSIS — I951 Orthostatic hypotension: Secondary | ICD-10-CM | POA: Diagnosis present

## 2014-03-08 DIAGNOSIS — E1142 Type 2 diabetes mellitus with diabetic polyneuropathy: Secondary | ICD-10-CM | POA: Diagnosis present

## 2014-03-08 DIAGNOSIS — I429 Cardiomyopathy, unspecified: Secondary | ICD-10-CM

## 2014-03-08 DIAGNOSIS — B952 Enterococcus as the cause of diseases classified elsewhere: Secondary | ICD-10-CM | POA: Diagnosis present

## 2014-03-08 DIAGNOSIS — Z85528 Personal history of other malignant neoplasm of kidney: Secondary | ICD-10-CM

## 2014-03-08 DIAGNOSIS — I330 Acute and subacute infective endocarditis: Secondary | ICD-10-CM

## 2014-03-08 DIAGNOSIS — I340 Nonrheumatic mitral (valve) insufficiency: Secondary | ICD-10-CM

## 2014-03-08 LAB — CULTURE, BLOOD (ROUTINE X 2): Culture: NO GROWTH

## 2014-03-08 LAB — GLUCOSE, CAPILLARY
GLUCOSE-CAPILLARY: 82 mg/dL (ref 70–99)
GLUCOSE-CAPILLARY: 176 mg/dL — AB (ref 70–99)
GLUCOSE-CAPILLARY: 148 mg/dL — AB (ref 70–99)
Glucose-Capillary: 107 mg/dL — ABNORMAL HIGH (ref 70–99)

## 2014-03-08 MED ORDER — METHOCARBAMOL 500 MG PO TABS
500.0000 mg | ORAL_TABLET | Freq: Four times a day (QID) | ORAL | Status: DC | PRN
  Administered 2014-03-09 – 2014-03-15 (×21): 500 mg via ORAL
  Filled 2014-03-08 (×21): qty 1

## 2014-03-08 MED ORDER — FLUTICASONE PROPIONATE HFA 110 MCG/ACT IN AERO
2.0000 | INHALATION_SPRAY | Freq: Two times a day (BID) | RESPIRATORY_TRACT | Status: DC
  Administered 2014-03-09 – 2014-03-15 (×10): 2 via RESPIRATORY_TRACT
  Filled 2014-03-08: qty 12

## 2014-03-08 MED ORDER — INSULIN ASPART 100 UNIT/ML ~~LOC~~ SOLN
0.0000 [IU] | Freq: Three times a day (TID) | SUBCUTANEOUS | Status: DC
  Administered 2014-03-08 – 2014-03-10 (×5): 2 [IU] via SUBCUTANEOUS
  Administered 2014-03-10 – 2014-03-11 (×2): 3 [IU] via SUBCUTANEOUS
  Administered 2014-03-11 – 2014-03-15 (×9): 2 [IU] via SUBCUTANEOUS

## 2014-03-08 MED ORDER — SORBITOL 70 % SOLN
30.0000 mL | Freq: Every day | Status: DC | PRN
  Administered 2014-03-09: 30 mL via ORAL
  Filled 2014-03-08: qty 30

## 2014-03-08 MED ORDER — ONDANSETRON HCL 4 MG/2ML IJ SOLN: 4.0000 mg | Freq: Four times a day (QID) | INTRAMUSCULAR | Status: DC | PRN

## 2014-03-08 MED ORDER — AMPICILLIN SODIUM 2 G IJ SOLR
2.0000 g | INTRAMUSCULAR | Status: DC
  Administered 2014-03-08 – 2014-03-15 (×42): 2 g via INTRAVENOUS
  Filled 2014-03-08 (×49): qty 2000

## 2014-03-08 MED ORDER — ATORVASTATIN CALCIUM 10 MG PO TABS
10.0000 mg | ORAL_TABLET | Freq: Every day | ORAL | Status: DC
  Administered 2014-03-08 – 2014-03-14 (×7): 10 mg via ORAL
  Filled 2014-03-08 (×8): qty 1

## 2014-03-08 MED ORDER — ALBUTEROL SULFATE (2.5 MG/3ML) 0.083% IN NEBU: 2.5000 mg | INHALATION_SOLUTION | Freq: Four times a day (QID) | RESPIRATORY_TRACT | Status: DC | PRN

## 2014-03-08 MED ORDER — METOPROLOL TARTRATE 25 MG PO TABS
25.0000 mg | ORAL_TABLET | Freq: Two times a day (BID) | ORAL | Status: DC
Start: 2014-03-08 — End: 2014-03-15
  Administered 2014-03-08 – 2014-03-15 (×14): 25 mg via ORAL
  Filled 2014-03-08 (×16): qty 1

## 2014-03-08 MED ORDER — OXYCODONE HCL 5 MG PO TABS
10.0000 mg | ORAL_TABLET | ORAL | Status: DC | PRN
  Administered 2014-03-08 – 2014-03-15 (×32): 10 mg via ORAL
  Filled 2014-03-08 (×33): qty 2

## 2014-03-08 MED ORDER — INSULIN GLARGINE 100 UNIT/ML ~~LOC~~ SOLN
25.0000 [IU] | Freq: Every day | SUBCUTANEOUS | Status: DC
  Administered 2014-03-08 – 2014-03-13 (×6): 25 [IU] via SUBCUTANEOUS
  Filled 2014-03-08 (×8): qty 0.25

## 2014-03-08 MED ORDER — INSULIN ASPART 100 UNIT/ML ~~LOC~~ SOLN
6.0000 [IU] | Freq: Three times a day (TID) | SUBCUTANEOUS | Status: DC
  Administered 2014-03-08 – 2014-03-15 (×19): 6 [IU] via SUBCUTANEOUS

## 2014-03-08 MED ORDER — TRAZODONE HCL 50 MG PO TABS: 50.0000 mg | ORAL_TABLET | Freq: Every evening | ORAL | Status: DC | PRN

## 2014-03-08 MED ORDER — CEFTRIAXONE SODIUM IN DEXTROSE 40 MG/ML IV SOLN
2.0000 g | Freq: Two times a day (BID) | INTRAVENOUS | Status: DC
  Administered 2014-03-08 – 2014-03-15 (×14): 2 g via INTRAVENOUS
  Filled 2014-03-08 (×15): qty 50

## 2014-03-08 MED ORDER — ONDANSETRON HCL 4 MG PO TABS: 4.0000 mg | ORAL_TABLET | Freq: Four times a day (QID) | ORAL | Status: DC | PRN

## 2014-03-08 MED ORDER — SENNOSIDES-DOCUSATE SODIUM 8.6-50 MG PO TABS
1.0000 | ORAL_TABLET | Freq: Two times a day (BID) | ORAL | Status: DC
  Administered 2014-03-08 – 2014-03-15 (×14): 1 via ORAL
  Filled 2014-03-08 (×13): qty 1
  Filled 2014-03-08: qty 2

## 2014-03-08 MED ORDER — ACETAMINOPHEN 325 MG PO TABS
650.0000 mg | ORAL_TABLET | Freq: Four times a day (QID) | ORAL | Status: DC | PRN
  Administered 2014-03-14: 650 mg via ORAL
  Filled 2014-03-08: qty 2

## 2014-03-08 MED ORDER — LISINOPRIL 2.5 MG PO TABS
2.5000 mg | ORAL_TABLET | Freq: Every day | ORAL | Status: DC
  Administered 2014-03-09 – 2014-03-15 (×7): 2.5 mg via ORAL
  Filled 2014-03-08 (×8): qty 1

## 2014-03-08 NOTE — Progress Notes (Signed)
PMR Admission Coordinator Pre-Admission Assessment  Patient: [name] is an 48 y.o., male MRN: [id number] DOB: 04/27/1965 Height: 6' (182.9 cm) Weight: 93.3 kg (205 lb 11 oz)  Insurance Information HMO: PPO: yes PCP: IPA: 80/20: OTHER:  PRIMARY: [place] Policy#: [id number] Subscriber: pt CM Name: Maryella Shivers. Phone#: [phone] Ext [phone] Fax#: [phone] Pre-Cert#: [id number] Employer: [employer] approved 12/9 through 12/15 when update due Benefits: Phone #: [phone] Name: 04/07/13 Eff. 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 Independent prior to admission living with his wife and 60-year-old son. Admitted 02/27/2014 with intractable low back pain 4 days denying any recent trauma as well as fever of 105 rectally and rigors. Patient works for [employer] travels internationally recently returning from [place]. MRI lumbar spine with no acute abnormalities noted degenerative disc changes L3-4. Sedimentation rate 56.  Patient developed seizures with decompensation requiring intubation mechanical ventilation. MRI showed small areas of acute infarct bilaterally consistent with cerebral emboli. MRA of the head negative. Echocardiogram ejection fraction 35% with grade 1 diastolic dysfunction. Carotid Dopplers no ICA stenosis. Infectious disease consulted for workup of fever TEE completed suggested mitral valve endocarditis. Blood cultures gram-positive cocci in chains. Cardiothoracic surgery Dr. Servando Snare consulted no surgical intervention at this time. Patient currently maintained on Rocephin as well as ampicillin 6 weeks. EEG consistent with nonspecific cerebral dysfunction encephalopathy.  Pt to receive IV antibiotics for 6 weeks and to follow up with thoracic surgery as an outpatient.  Nonischemic cardiomyopathy likely in the setting of acute/subacute endocarditis. Echo showing LV dysfunction with EF of 30-35% and moderate mitral regurgitation and global hypokinesis. Continue metoprolol, ACE inhibitor and Lipitor. No strict indication for ASA. Will avoid given higher bleeding risk.  Type 2 DM uncontrolled . Hgb A1C 10.3. Pt previously on oral agents. Now new to insulin and closer monitoring.  Admission MRI of the lumbar spine showing disc protrusion at L3-L4 with L3 nerve root pressure. No findings of discitis or abscess. Pain control with Robaxin and oxycodone. ( dose increased) . Able to ambulate short distances with a walker but needing support. Ambulation limited due to back pain. Repeat MRI of the lumbar spine w/o contrast done on 12/8 showing early diskitis vs osteomyelitis over L3-L4 with adjacent right paraspinal edema. D/w ID. No further recommendations. Should continue empiric abx. Low yield in obtaining fluid for culture. He can follow-up with neurosurgery Dr Kathyrn Sheriff as outpt.   Past Medical History  Past Medical History  Diagnosis Date  . Diabetes mellitus without complication     type  2  . Hyperlipidemia   . Allergic rhinitis   . lipoma   . H/O scoliosis   . Hypertension     Diagnostic exercise tolerance test assessment:04/30/2010 : comments normal -no evidence os ischemia by ST analysis  . Solitary kidney   .  Wilm's tumor (nephroblastoma)     Family History  family history includes Heart disease in his father.  Prior Rehab/Hospitalizations: none  Current Medications  Current facility-administered medications: acetaminophen (TYLENOL) tablet 650 mg, 650 mg, Oral, Q6H PRN, Wilhelmina Mcardle, MD; albuterol (PROVENTIL) (2.5 MG/3ML) 0.083% nebulizer solution 2.5 mg, 2.5 mg, Inhalation, Q6H PRN, Theressa Millard, MD; ampicillin (OMNIPEN) 2 g in sodium chloride 0.9 % 50 mL IVPB, 2 g, Intravenous, 6 times per day, Campbell Riches, MD, 2 g at 03/08/14 0819 atorvastatin (LIPITOR) tablet 10 mg, 10 mg, Oral, q1800, Wilhelmina Mcardle, MD, 10 mg at 03/07/14 1717; cefTRIAXone (ROCEPHIN) 2 g in dextrose 5 % 50 mL IVPB - Premix, 2 g, Intravenous, Q12H, [name], NP, 2 g at 03/08/14 0952; fentaNYL (SUBLIMAZE) injection 12.5-25 mcg, 12.5-25 mcg, Intravenous, Q2H PRN, Wilhelmina Mcardle, MD, 25 mcg at 03/08/14 0952 fluticasone (FLOVENT HFA) 110 MCG/ACT inhaler 2 puff, 2 puff, Inhalation, BID, Nishant Dhungel, MD, 2 puff at 03/08/14 0853; insulin aspart (novoLOG) injection 0-15 Units, 0-15 Units, Subcutaneous, TID WC, Wilhelmina Mcardle, MD, 2 Units at 03/07/14 1717; insulin aspart (novoLOG) injection 0-5 Units, 0-5 Units, Subcutaneous, QHS, Wilhelmina Mcardle, MD, 0 Units at 03/03/14 2206 insulin aspart (novoLOG) injection 6 Units, 6 Units, Subcutaneous, TID WC, Nishant Dhungel, MD, 6 Units at 03/08/14 0930; insulin glargine (LANTUS) injection 25 Units, 25 Units, Subcutaneous, QHS, Nishant Dhungel, MD, 25 Units at 03/07/14 2211; lisinopril (PRINIVIL,ZESTRIL) tablet 2.5 mg, 2.5 mg, Oral, Daily, Dorris Carnes V, MD, 2.5 mg at 03/08/14 0951; LORazepam (ATIVAN) injection 2  mg, 2 mg, Intravenous, PRN, Wilhelmina Mcardle, MD methocarbamol (ROBAXIN) tablet 500 mg, 500 mg, Oral, Q6H PRN, Louellen Molder, MD, 500 mg at 03/07/14 2056; metoprolol tartrate (LOPRESSOR) tablet 25 mg, 25 mg, Oral, BID, Wilhelmina Mcardle, MD, 25 mg at 03/08/14 0951; [DISCONTINUED] ondansetron (ZOFRAN) tablet 4 mg, 4 mg, Oral, Q6H PRN **OR** ondansetron (ZOFRAN) injection 4 mg, 4 mg, Intravenous, Q6H PRN, Theressa Millard, MD oxyCODONE (Oxy IR/ROXICODONE) immediate release tablet 10 mg, 10 mg, Oral, Q4H PRN, Nishant Dhungel, MD, 10 mg at 03/08/14 0952; sodium chloride 0.9 % injection 10-40 mL, 10-40 mL, Intracatheter, Q12H, Jacolyn Reedy, MD, 10 mL at 03/08/14 3810; sodium chloride 0.9 % injection 10-40 mL, 10-40 mL, Intracatheter, PRN, Jacolyn Reedy, MD, 10 mL at 03/04/14 2237  Patients Current Diet: Diet heart healthy/carb modified  Precautions / Restrictions Precautions Precautions: Fall, Back Precaution Booklet Issued: No Precaution Comments: Reviewed back precautions.  Restrictions Weight Bearing Restrictions: No   Prior Activity Level Community (5-7x/wk): very active. 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 Travels internationally frequently   Development worker, international aid / [place] Devices/Equipment: None, CBG Meter Home Equipment: None  Prior Functional Level Prior Function Level of Independence: Independent Comments: Pt works for [employer] in [place] and travels world wide frequently.   Current Functional Level Cognition  Overall Cognitive Status: Within Functional Limits for tasks assessed (basic cognition appears intact) Orientation Level: Oriented X4   Extremity Assessment (includes Sensation/Coordination)          ADLs  Overall ADL's : Needs assistance/impaired Eating/Feeding: Set up, Bed level Grooming: Wash/dry hands, Wash/dry face, Oral care, Brushing hair, Applying deodorant, Set up, Bed level Upper Body  Bathing: Minimal assitance, Bed level Lower Body Bathing: Maximal assistance, Sit to/from stand, Bed level Lower Body Bathing Details (indicate cue type and reason): Pt unable to unweight unilateral UE to perform ADLs in seated position due to severity  of back pain Upper Body Dressing : Total assistance, Sitting Lower Body Dressing: Total assistance, Sit to/from stand Lower Body Dressing Details (indicate cue type and reason): unable to access bil. LEs due to increased pain  Toilet Transfer: Minimal assistance, Ambulation, Comfort height toilet, BSC Toilet Transfer Details (indicate cue type and reason): Moves slowly and in guarded fashion due to pain. Requires signficant amount of time.  Toileting- Clothing Manipulation and Hygiene: Total assistance, Sit to/from stand Functional mobility during ADLs: Minimal assistance, Rolling walker General ADL Comments: Pt limited by severity of back pain. He is very motivated    Mobility  Overal bed mobility: Needs Assistance Bed Mobility: Rolling, Sidelying to Sit, Sit to Sidelying Rolling: Min assist Sidelying to sit: Mod assist Sit to sidelying: Mod assist General bed mobility comments: heavily dependent upon bed rail. Pt moves slowly due to pain. requires assist for LEs and to raise trunk from bed     Transfers  Overall transfer level: Needs assistance Equipment used: Rolling walker (2 wheeled) Transfers: Sit to/from Stand, Stand Pivot Transfers Sit to Stand: Min assist Stand pivot transfers: Min assist General transfer comment: Pt requires increased time and assist to move sit to stand and to pivot.     Ambulation / Gait / Stairs / Wheelchair Mobility  Ambulation/Gait Ambulation/Gait assistance: Architect (Feet): 40 Feet Assistive device: Rolling walker (2 wheeled) Gait Pattern/deviations: Step-through pattern, Decreased stride length Gait velocity: quite slow General Gait Details: pt leans heavilyon  RW 2/2 back pain. difficult for pt to maintain upright psoture.     Posture / Balance Dynamic Sitting Balance Sitting balance - Comments: heavily reliant on bil. UEs    Special needs/care consideration Bowel mgmt: continent Bladder mgmt: continent Diabetic mgmt oral agents for DM pta. HgbA1c 10.3. New to insulin   Previous Home Environment Living Arrangements: Spouse/significant other Lives With: Spouse, Son (78 yo son) Available Help at Discharge: Other (Comment) (wife avialable 24/7 initially. She is a Runner, broadcasting/film/video for an autic) Type of Home: House Home Layout: Two level, Able to live on main level with bedroom/bathroom Alternate Level Stairs-Rails: Right Alternate Level Stairs-Number of Steps: flight Home Access: Stairs to enter Entrance Stairs-Rails: None Entrance Stairs-Number of Steps: 5 Bathroom Shower/Tub: Health visitor: Standard Bathroom Accessibility: Yes How Accessible: Accessible via walker Home Care Services: No Additional Comments: wif state s4 poster high bed an issue for his back. She is redoing the mattress to lessen the height of the bed  Discharge Living Setting Plans for Discharge Living Setting: Patient's home, Lives with (comment), Other (Comment) (wife and 63 yo old son) Type of Home at Discharge: House Discharge Home Layout: Two level, Full bath on main level, Able to live on main level with bedroom/bathroom Alternate Level Stairs-Rails: Right Alternate Level Stairs-Number of Steps: flight Discharge Home Access: Stairs to enter Entrance Stairs-Rails: None Entrance Stairs-Number of Steps: 5 steps Discharge Bathroom Shower/Tub: Walk-in shower, Other (comment) (does not have a seat) Discharge Bathroom Toilet: Standard Discharge Bathroom Accessibility: Yes How Accessible: Accessible via walker Does the patient have any problems obtaining your medications?: No  Social/Family/Support Systems Patient Roles: Spouse, Parent, Other  (Comment) (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 Evelena Leyden) Contact Information: Cathleen Corti, wife Anticipated Caregiver: wife Anticipated Caregiver's Contact Information: see above Ability/Limitations of Caregiver: wife is a Runner, broadcasting/film/video for an autistic child but plans to take off initally Caregiver Availability: 24/7 Discharge Plan Discussed with Primary Caregiver: Yes Is Caregiver In Agreement with Plan?: Yes Does Caregiver/Family  have Issues with Lodging/Transportation while Pt is in Rehab?: No  Goals/Additional Needs Patient/Family Goal for Rehab: Mod I with PT and OT Expected length of stay: ELOS 7 to 10 days Special Service Needs: Pt to d/c home on 6 weeks of IV antibiotics. Wife plans to be taught to administer. Pt has A PICC line pta to CIR Pt/Family Agrees to Admission and willing to participate: Yes Program Orientation Provided & Reviewed with Pt/Caregiver Including Roles & Responsibilities: Yes  Decrease burden of Care through IP rehab admission: n/a  Possible need for SNF placement upon discharge:no  Patient Condition: This patient's medical and functional status has changed since the consult dated: 03/06/14 in which the Rehabilitation Physician determined and documented that the patient's condition is appropriate for intensive rehabilitative care in an inpatient rehabilitation facility. See "History of Present Illness" (above) for medical update. Functional changes are: min to mod assist. Patient's medical and functional status update has been discussed with the Rehabilitation physician and patient remains appropriate for inpatient rehabilitation. Will admit to inpatient rehab today.  Preadmission Screen Completed By: Cleatrice Burke, 03/08/2014 11:56 AM ______________________________________________________________________  Discussed status with Dr. Naaman Plummer on 03/08/14 at 1156 and received telephone approval for admission today.  Admission Coordinator:  Cleatrice Burke, time 2863 Date 03/08/2014.          Cosigned by: Meredith Staggers, MD at 03/08/2014 12:16 PM  Revision History     Date/Time User Provider Type Action   03/08/2014 12:16 PM Meredith Staggers, MD Physician Cosign   03/08/2014 11:56 AM Cleatrice Burke, RN Rehab Admission Coordinator Sign

## 2014-03-08 NOTE — Progress Notes (Signed)
Patient Name: Jaleil Renwick Date of Encounter: 03/08/2014  Active Problems:   HYPERTENSION, BENIGN   Family history of ischemic heart disease   Intractable back pain   Hyperglycemia   DM (diabetes mellitus), type 2, uncontrolled   Hyperlipidemia   Gram-positive bacteremia   Severe sepsis   Cardiomyopathy- EF 30-35%   CVA (cerebral infarction)- embolic   Acute respiratory failure   Acute bacterial endocarditis   Enterococcal bacteremia   Diskitis   Osteomyelitis of lumbar vertebra   Solitary kidney, acquired   Acute encephalopathy   Primary Cardiologist: Dr Irish Lack  Patient Profile: 48 yo male w/ hx HTN, NIDDM, HLD, FH CAD, admitted 11/29 w/   SUBJECTIVE: Feeling well, determined to be compliant with medications and treatments. Anxious for discharge.  OBJECTIVE Filed Vitals:   03/07/14 2208 03/08/14 0436 03/08/14 0851 03/08/14 0854  BP: 129/73 139/76 151/72   Pulse: 67 72 79   Temp: 98.8 F (37.1 C) 98.4 F (36.9 C) 99.1 F (37.3 C)   TempSrc: Oral Oral Oral   Resp: 17 18 16    Height:      Weight:      SpO2: 96% 98% 97% 98%    Intake/Output Summary (Last 24 hours) at 03/08/14 1147 Last data filed at 03/08/14 0900  Gross per 24 hour  Intake   1460 ml  Output   1300 ml  Net    160 ml   Filed Weights   02/27/14 0957 03/01/14 1303 03/02/14 0500  Weight: 190 lb (86.183 kg) 192 lb 7.4 oz (87.3 kg) 205 lb 11 oz (93.3 kg)    PHYSICAL EXAM General: Well developed, well nourished, male in no acute distress. Head: Normocephalic, atraumatic.  Neck: Supple without bruits, JVD not elevated. Lungs:  Resp regular and unlabored, CTA. Heart: RRR, S1, S2, no S3, S4, or murmur; no rub. Abdomen: Soft, non-tender, non-distended, BS + x 4.  Extremities: No clubbing, cyanosis, no edema.  Neuro: Alert and oriented X 3. Moves all extremities spontaneously. Psych: Normal affect.  LABS: CBC: Recent Labs  03/06/14 0603  WBC 10.7*  HGB 12.3*  HCT 36.4*  MCV 90.8    PLT 137*   TELE:        ECHO:  Reviewed in the paper chart. His EF has improved to 55-60 percent. Vegetation on the mitral valve seen, there is a posteriorly directed jet with moderate-severe MR.  Radiology/Studies: Mr Lumbar Spine Wo Contrast 03/07/2014   CLINICAL DATA:  Scoliosis. Low back pain. This has worsened over the past 4 days.  EXAM: MRI LUMBAR SPINE WITHOUT CONTRAST  TECHNIQUE: Multiplanar, multisequence MR imaging of the lumbar spine was performed. No intravenous contrast was administered.  COMPARISON:  02/27/2014  FINDINGS: The lowest lumbar type non-rib-bearing vertebra is labeled as L5. The conus medullaris appears normal. Conus level: L1-2.  The left kidney is absent with amorphous tissue in the expected left renal fossa probably reflecting bowel.  T 1 hypointense 2.2 by 1.4 cm lesion in the sacrum at the S1 level, technically nonspecific, but given the speckled appearance on axial images this is probably an atypical hemangioma.  Fluid signal in the narrow intervertebral disc at L3-4 with edema along the vertebral endplates at this level, similar to prior. There may be some low-level paravertebral edema on the right, as on image 16 of series 7. I do not observe an obvious abnormal epidural fluid collection in this area.  Dextroconvex lumbar scoliosis with significant rotary component.  Additional findings at  individual levels are as follows:  L1-2:  No impingement.  Mild disc bulge.  L2-3:  No impingement.  Mild disc bulge.  L3-4: Moderate right foraminal stenosis due to facet arthropathy, moderate right and mild left foraminal stenosis due to intervertebral spurring, facet spurring, right foraminal disc protrusion, and underlying disc bulge. Left facet joint effusion noted.  L4-5: Moderate bilateral foraminal stenosis due to disc bulge and facet arthropathy.  L5-S1: Mild right foraminal stenosis due to intervertebral spurring, facet spurring, and minimal disc bulge.  IMPRESSION: 1. Possible  early discitis-osteomyelitis at L3-4. Adjacent right paraspinal edema at this level. Correlate with symptoms in determining the utility of disc aspiration. 2. Lumbar spondylosis and degenerative disc disease cause moderate impingement at L3-4 and L4-5, and mild impingement at L5-S1, as detailed above. This impingement is not appreciably changed compared to the prior exam. 3. Dextroconvex lumbar scoliosis with significant rotary component. 4. Suspected atypical hemangioma in the S1 level of the sacrum. 5. Absent left kidney.   Electronically Signed   By: Sherryl Barters M.D.   On: 03/07/2014 10:56     Current Medications:  . ampicillin (OMNIPEN) IV  2 g Intravenous 6 times per day  . atorvastatin  10 mg Oral q1800  . cefTRIAXone (ROCEPHIN)  IV  2 g Intravenous Q12H  . fluticasone  2 puff Inhalation BID  . insulin aspart  0-15 Units Subcutaneous TID WC  . insulin aspart  0-5 Units Subcutaneous QHS  . insulin aspart  6 Units Subcutaneous TID WC  . insulin glargine  25 Units Subcutaneous QHS  . lisinopril  2.5 mg Oral Daily  . metoprolol tartrate  25 mg Oral BID  . sodium chloride  10-40 mL Intracatheter Q12H      ASSESSMENT AND PLAN: Cardiomyopathy- EF 30-35% - his EF has improved on repeat echocardiogram. Would continue the ACE inhibitor and beta blocker he is currently taking. Because of the history of left ventricular dysfunction as well as SBE, he will need a follow-up in the office with a repeat echocardiogram. We will arrange.  Otherwise, per IM, ID, TCTS, Neurology, CCM, and Rehabilitation teams Active Problems:   HYPERTENSION, BENIGN   Family history of ischemic heart disease   Intractable back pain   Hyperglycemia   DM (diabetes mellitus), type 2, uncontrolled   Hyperlipidemia   Gram-positive bacteremia   Severe sepsis   CVA (cerebral infarction)- embolic   Acute respiratory failure   Acute bacterial endocarditis   Enterococcal bacteremia   Diskitis   Osteomyelitis of  lumbar vertebra   Solitary kidney, acquired   Acute encephalopathy   SignedRosaria Ferries , PA-C 11:47 AM 03/08/2014

## 2014-03-08 NOTE — Progress Notes (Signed)
Questioned Marlowe [place], PA about the safety of administering the flu and pneumonia vaccine in light of patient condition.  PA advises to not give the vaccines at this time.  Notified PA that patient last bowel movement was the 6th; new order received for senokot.  Will continue to monitor.

## 2014-03-08 NOTE — H&P (Signed)
Expand All Collapse All      Physical Medicine and Rehabilitation Admission H&P   Chief Complaint  Patient presents with  . Back Pain  : HPI: [name] is a 48 y.o.right handed male with history of diabetes mellitus, hypertension and scoliosis. Independent prior to admission living with his wife and 58-year-old son. Admitted 02/27/2014 with intractable low back pain 4 days denying any recent trauma as well as fever of 105 rectally and rigors. Patient works for [employer] and travels internationally recently returning from [place]. MRI lumbar spine 02/27/2014 with no acute abnormalities noted degenerative disc changes L3-4. Sedimentation rate 56. Patient developed seizures with decompensation requiring intubation mechanical ventilation. MRI showed small areas of acute infarct bilaterally consistent with cerebral emboli. MRA of the head negative. Echocardiogram ejection fraction 35% with grade 1 diastolic dysfunction as well as large echo density measuring 1.4 x 2 cm attached to the posterior leaflet of the mitral valve.. Carotid Dopplers no ICA stenosis. Infectious disease consulted for workup of fever TEE completed suggested mitral valve endocarditis. Venous Dopplers lower extremities negative for DVT. Blood cultures gram-positive cocci in chains. Cardiothoracic surgery Dr. Servando Snare consulted no surgical intervention at this time. Patient currently maintained on Rocephin as well as ampicillin until 04/15/2014 to complete a 6 week course. 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 Again contacts were made to infectious disease with recommendations to continue plan of care antibiotic therapy. EEG consistent with nonspecific cerebral dysfunction encephalopathy. Patient was later extubated slow progressive gains with therapy  evaluation completed 03/05/2014 with recommendations of physical medicine rehabilitation consult. [name] was admitted for a comprehensive rehabilitation program  ROS Review of Systems  Constitutional: Positive for fever.  Musculoskeletal: Positive for back pain.  All other systems reviewed and are negative   Past Medical History  Diagnosis Date  . Diabetes mellitus without complication     type 2  . Hyperlipidemia   . Allergic rhinitis   . lipoma   . H/O scoliosis   . Hypertension     Diagnostic exercise tolerance test assessment:04/30/2010 : comments normal -no evidence os ischemia by ST analysis  . Solitary kidney   . Wilm's tumor (nephroblastoma)    Past Surgical History  Procedure Laterality Date  . Lipoma excision    . Total nephrectomy Left   . Thyroid surgery    . Knee surgery     Family History  Problem Relation Age of Onset  . Heart disease Father    Social History:  reports that he has never smoked. He has never used smokeless tobacco. He reports that he drinks alcohol. He reports that he does not use illicit drugs. Allergies: No Known Allergies Medications Prior to Admission  Medication Sig Dispense Refill  . acetaminophen (TYLENOL) 500 MG tablet Take 1,000 mg by mouth every 6 (six) hours as needed for moderate pain.    Marland Kitchen albuterol (PROVENTIL HFA;VENTOLIN HFA) 108 (90 BASE) MCG/ACT inhaler Inhale 1 puff into the lungs every 6 (six) hours as needed for wheezing or shortness of breath.    Marland Kitchen aspirin EC 81 MG tablet Take 81 mg by mouth daily.    Marland Kitchen atorvastatin (LIPITOR) 10 MG tablet Take 10 mg by mouth daily.    . diclofenac sodium (VOLTAREN) 1 % GEL Apply 2 g topically 4 (four) times daily.    . hydrochlorothiazide (  HYDRODIURIL) 25 MG tablet Take 25 mg by mouth daily.    Marland Kitchen lisinopril (PRINIVIL,ZESTRIL) 20 MG tablet Take 20 mg by mouth daily.    . Magnesium  Salicylate Tetrahyd (DOANS EXTRA STRENGTH) 580 MG TABS Take 2 tablets by mouth every 6 (six) hours as needed (pain).    . metFORMIN (GLUCOPHAGE) 500 MG tablet Take 500 mg by mouth 2 (two) times daily with a meal.    . mometasone (ASMANEX) 220 MCG/INH inhaler Inhale 1 puff into the lungs daily.    . repaglinide (PRANDIN) 0.5 MG tablet Take 0.5 mg by mouth 2 (two) times daily before a meal.      Home: Home Living Family/patient expects to be discharged to:: Private residence Living Arrangements: Spouse/significant other Available Help at Discharge: Family, Available PRN/intermittently Type of Home: House Home Access: Stairs to enter Technical brewer of Steps: 5 Entrance Stairs-Rails: None Home Layout: Two level, Able to live on main level with bedroom/bathroom (6yo son's room upstairs) Alternate Level Stairs-Number of Steps: flight Alternate Level Stairs-Rails: Right Home Equipment: None  Functional History: Prior Function Level of Independence: Independent  Functional Status:  Mobility: Bed Mobility Overal bed mobility: Needs Assistance Bed Mobility: Sit to Sidelying Rolling: Mod assist Sidelying to sit: Mod assist Sit to sidelying: Mod assist General bed mobility comments: A with bring Bil LEs into bed. cues to minimize twisting.  Transfers Overall transfer level: Needs assistance Equipment used: Rolling walker (2 wheeled) Transfers: Sit to/from Stand Sit to Stand: Mod assist General transfer comment: cues for UE use. Height of bed had tobe elevated when returning to sitting 2/2 increased back pain.  Ambulation/Gait Ambulation/Gait assistance: Min assist Ambulation Distance (Feet): 40 Feet Assistive device: Rolling walker (2 wheeled) Gait Pattern/deviations: Step-through pattern, Decreased stride length Gait velocity: quite slow General Gait Details: pt leans heavilyon RW 2/2 back pain. difficult for pt to maintain upright psoture.      ADL:    Cognition: Cognition Overall Cognitive Status: Within Functional Limits for tasks assessed Orientation Level: Oriented X4 Cognition Arousal/Alertness: Awake/alert Behavior During Therapy: WFL for tasks assessed/performed Overall Cognitive Status: Within Functional Limits for tasks assessed  Physical Exam: Blood pressure 128/84, pulse 62, temperature 98.4 F (36.9 C), temperature source Oral, resp. rate 18, height 6' (1.829 m), weight 93.3 kg (205 lb 11 oz), SpO2 97 %.   Constitutional: Patient is cooperative with exam. He is oriented to person, place, and time. He appears well-developed. Appears to be more comfortable today. HENT: Tongue is midline/face is symmetrical Head: Normocephalic.  Eyes: EOM are normal.  Neck: Normal range of motion. Neck supple. No thyromegaly present.  Cardiovascular: Normal rate and regular rhythm.  Respiratory: Effort normal and breath sounds normal. No respiratory distress.  GI: Soft. Bowel sounds are normal. He exhibits no distension.  Neurological: He is alert and oriented to person, place, and time. CN exam intact. Appears to have normal insight and awareness. Memory functional. UES: grossly 5/5 proximal to distal. LES: proximally inhibited by pain, 2/5 HF, 3+ KE, 4+ ADF/APF. No sensory deficits. DTR's 1+ M/S: low back tender to palpation at the L3-L5 levels with some associated paraspinal spasms. Has difficulty moving from a seated to standing position due to pain.  Skin: Skin is warm and dry/ no breakdown Psych: mood pleasant and appropriate.     Lab Results Last 48 Hours    Results for orders placed or performed during the hospital encounter of 02/26/14 (from the past 48 hour(s))  Glucose, capillary Status: Abnormal  Collection Time: 03/05/14 6:10 AM  Result Value Ref Range   Glucose-Capillary 153 (H) 70 - 99 mg/dL  Lipid panel Status: Abnormal   Collection Time: 03/05/14 6:30 AM  Result Value  Ref Range   Cholesterol 116 0 - 200 mg/dL   Triglycerides 122 <150 mg/dL   HDL 21 (L) >39 mg/dL   Total CHOL/HDL Ratio 5.5 RATIO   VLDL 24 0 - 40 mg/dL   LDL Cholesterol 71 0 - 99 mg/dL    Comment:   Total Cholesterol/HDL:CHD Risk Coronary Heart Disease Risk Table  Men Women 1/2 Average Risk 3.4 3.3 Average Risk 5.0 4.4 2 X Average Risk 9.6 7.1 3 X Average Risk 23.4 11.0   Use the calculated Patient Ratio above and the CHD Risk Table to determine the patient's CHD Risk.   ATP III CLASSIFICATION (LDL): <100 mg/dL Optimal 100-129 mg/dL Near or Above  Optimal 130-159 mg/dL Borderline 160-189 mg/dL High >190 mg/dL Very High   CBC Status: Abnormal   Collection Time: 03/05/14 6:35 AM  Result Value Ref Range   WBC 8.9 4.0 - 10.5 K/uL   RBC 3.95 (L) 4.22 - 5.81 MIL/uL   Hemoglobin 12.0 (L) 13.0 - 17.0 g/dL   HCT 35.6 (L) 39.0 - 52.0 %   MCV 90.1 78.0 - 100.0 fL   MCH 30.4 26.0 - 34.0 pg   MCHC 33.7 30.0 - 36.0 g/dL   RDW 12.6 11.5 - 15.5 %   Platelets 131 (L) 150 - 400 K/uL  Glucose, capillary Status: Abnormal   Collection Time: 03/05/14 11:32 AM  Result Value Ref Range   Glucose-Capillary 219 (H) 70 - 99 mg/dL  Glucose, capillary Status: Abnormal   Collection Time: 03/05/14 4:02 PM  Result Value Ref Range   Glucose-Capillary 213 (H) 70 - 99 mg/dL  Glucose, capillary Status: Abnormal   Collection Time: 03/05/14 9:49 PM  Result Value Ref Range   Glucose-Capillary 202 (H) 70 - 99 mg/dL  Glucose, capillary Status: Abnormal   Collection Time: 03/06/14 5:50 AM  Result Value Ref Range   Glucose-Capillary 155 (H) 70 - 99 mg/dL  CBC Status: Abnormal   Collection Time: 03/06/14 6:03 AM  Result Value Ref Range   WBC 10.7 (H)  4.0 - 10.5 K/uL   RBC 4.01 (L) 4.22 - 5.81 MIL/uL   Hemoglobin 12.3 (L) 13.0 - 17.0 g/dL   HCT 36.4 (L) 39.0 - 52.0 %   MCV 90.8 78.0 - 100.0 fL   MCH 30.7 26.0 - 34.0 pg   MCHC 33.8 30.0 - 36.0 g/dL   RDW 12.6 11.5 - 15.5 %   Platelets 137 (L) 150 - 400 K/uL  Glucose, capillary Status: Abnormal   Collection Time: 03/06/14 11:16 AM  Result Value Ref Range   Glucose-Capillary 197 (H) 70 - 99 mg/dL   Comment 1 Documented in Chart    Comment 2 Notify RN   Glucose, capillary Status: Abnormal   Collection Time: 03/06/14 4:42 PM  Result Value Ref Range   Glucose-Capillary 140 (H) 70 - 99 mg/dL  Glucose, capillary Status: Abnormal   Collection Time: 03/06/14 9:03 PM  Result Value Ref Range   Glucose-Capillary 165 (H) 70 - 99 mg/dL      Imaging Results (Last 48 hours)    No results found.       Medical Problem List and Plan: 1. 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 DVT Prophylaxis/Anticoagulation: SCDs. Venous Doppler studies 03/06/2014 negative  3. Pain Management: Oxycodone and Robaxin as needed -probably best to schedule pain medication prior to activities 4. ID/enterococcal bacteremia. Intravenous ampicillin and Rocephin until 04/15/2014 completing a six-week course.  -plan is for conservative care regarding his back as well unless symptoms and pathology progress. 5. Neuropsych: This patient is capable of making decisions on his own behalf. 6. Skin/Wound Care: Routine skin checks 7. Fluids/Electrolytes/Nutrition: Strict I&O's. Follow-up chemistries. Provide nutritional supplements as needed 8. Diabetes mellitus. Hemoglobin A1c 10.3. NovoLog 6 units 3 times a day, Lantus insulin 25 units daily at bedtime. Provide diabetic teaching. Continued blood sugars before meals and at bedtime 9. Hypertension. Lisinopril 2.5 mg daily,  Lopressor 25 mg twice a day. Monitor with increased mobility 10. Hyperlipidemia. Lipitor   Post Admission Physician Evaluation: 1. Functional deficits secondary to septic emboli to the brain and lumbar diskitis. 2. Patient is admitted to receive collaborative, interdisciplinary care between the physiatrist, rehab nursing staff, and therapy team. 3. Patient's level of medical complexity and substantial therapy needs in context of that medical necessity cannot be provided at a lesser intensity of care such as a SNF. 4. Patient has experienced substantial functional loss from his/her baseline which was documented above under the "Functional History" and "Functional Status" headings. Judging by the patient's diagnosis, physical exam, and functional history, the patient has potential for functional progress which will result in measurable gains while on inpatient rehab. These gains will be of substantial and practical use upon discharge in facilitating mobility and self-care at the household level. 5. Physiatrist will provide 24 hour management of medical needs as well as oversight of the therapy plan/treatment and provide guidance as appropriate regarding the interaction of the two. 6. 24 hour rehab nursing will assist with bladder management, bowel management, safety, skin/wound care, disease management, medication administration, pain management and patient education and help integrate therapy concepts, techniques,education, etc. 7. PT will assess and treat for/with: Lower extremity strength, range of motion, stamina, balance, functional mobility, safety, adaptive techniques and equipment, NMR, pain mgt, ego support. Goals are: mod I. 8. OT will assess and treat for/with: ADL's, functional mobility, safety, upper extremity strength, adaptive techniques and equipment, NMR, pain control, leisure awareness. Goals are: mod I. Therapy may proceed with showering this patient. 9. SLP will assess and  treat for/with: n/a. Goals are: n/a. 10. Case Management and Social Worker will assess and treat for psychological issues and discharge planning. 11. Team conference will be held weekly to assess progress toward goals and to determine barriers to discharge. 12. Patient will receive at least 3 hours of therapy per day at least 5 days per week. 13. ELOS: 7-10 days  14. Prognosis: excellent     Meredith Staggers, MD, [facility] 03/08/2014

## 2014-03-08 NOTE — Significant Event (Signed)
Patient discharged to inpatient rehab, [phone], taken via wheelchair. VS stable prior and during the transfer. Patient's is settled into bed per his requests. Patient's spouse at bedside; all personal belongings taken by his wife. Report given to [facility], receiving RN. Staff in room prior to RN leaving. Icel Castles, Therapist, sports

## 2014-03-08 NOTE — Progress Notes (Signed)
I have insurance approval to admit pt to inpt rehab today. Pt is in agreement. I have contacted Dr. Karleen Hampshire, RN CM and SW. I iwll make the arrangements. [phone]

## 2014-03-08 NOTE — Consult Note (Signed)
Physical Medicine and Rehabilitation Consult Reason for Consult: CVA Referring Physician: [facility]   HPI: [name] is a 48 y.o.right handed male with history of diabetes mellitus, hypertension and scoliosis. Independent prior to admission living with his wife and 70-year-old son. Admitted 02/27/2014 with intractable low back pain 4 days denying any recent trauma as well as fever of 105 rectally and rigors. Patient works for [employer] travels internationally recently returning from [place]. MRI lumbar spine with no acute abnormalities noted degenerative disc changes L3-4. Sedimentation rate 56. Patient developed seizures with decompensation requiring intubation mechanical ventilation. MRI showed small areas of acute infarct bilaterally consistent with cerebral emboli. MRA of the head negative. Echocardiogram ejection fraction 35% with grade 1 diastolic dysfunction. Carotid Dopplers no ICA stenosis. Infectious disease consulted for workup of fever TEE completed suggested mitral valve endocarditis. Blood cultures gram-positive cocci in chains. Cardiothoracic surgery Dr. Servando Snare consulted no surgical intervention at this time. Patient currently maintained on Rocephin as well as ampicillin 6 weeks. EEG consistent with nonspecific cerebral dysfunction encephalopathy. Patient was later extubated slow progressive gains with therapy evaluation completed 03/05/2014 with recommendations of physical medicine rehabilitation consult.   Review of Systems  Constitutional: Positive for fever.  Musculoskeletal: Positive for back pain.  All other systems reviewed and are negative.  Past Medical History  Diagnosis Date  . Diabetes mellitus without complication     type 2  . Hyperlipidemia   . Allergic rhinitis   . lipoma   . H/O scoliosis   . Hypertension     Diagnostic exercise tolerance test assessment:04/30/2010 : comments normal -no evidence os ischemia by ST analysis  . Solitary  kidney   . Wilm's tumor (nephroblastoma)    Past Surgical History  Procedure Laterality Date  . Lipoma excision    . Total nephrectomy Left   . Thyroid surgery    . Knee surgery     Family History  Problem Relation Age of Onset  . Heart disease Father    Social History:  reports that he has never smoked. He has never used smokeless tobacco. He reports that he drinks alcohol. He reports that he does not use illicit drugs. Allergies: No Known Allergies Medications Prior to Admission  Medication Sig Dispense Refill  . acetaminophen (TYLENOL) 500 MG tablet Take 1,000 mg by mouth every 6 (six) hours as needed for moderate pain.    Marland Kitchen albuterol (PROVENTIL HFA;VENTOLIN HFA) 108 (90 BASE) MCG/ACT inhaler Inhale 1 puff into the lungs every 6 (six) hours as needed for wheezing or shortness of breath.    Marland Kitchen aspirin EC 81 MG tablet Take 81 mg by mouth daily.    Marland Kitchen atorvastatin (LIPITOR) 10 MG tablet Take 10 mg by mouth daily.    . diclofenac sodium (VOLTAREN) 1 % GEL Apply 2 g topically 4 (four) times daily.    . hydrochlorothiazide (HYDRODIURIL) 25 MG tablet Take 25 mg by mouth daily.    Marland Kitchen lisinopril (PRINIVIL,ZESTRIL) 20 MG tablet Take 20 mg by mouth daily.    . Magnesium Salicylate Tetrahyd (DOANS EXTRA STRENGTH) 580 MG TABS Take 2 tablets by mouth every 6 (six) hours as needed (pain).    . metFORMIN (GLUCOPHAGE) 500 MG tablet Take 500 mg by mouth 2 (two) times daily with a meal.    . mometasone (ASMANEX) 220 MCG/INH inhaler Inhale 1 puff into the lungs daily.    . repaglinide (PRANDIN) 0.5 MG tablet Take 0.5 mg by mouth 2 (two) times daily before a meal.  Home: Home Living Family/patient expects to be discharged to:: Private residence Living Arrangements: Spouse/significant other Available Help at Discharge: Family, Available PRN/intermittently Type of Home: House Home Access: Stairs to  enter Technical brewer of Steps: 5 Entrance Stairs-Rails: None Home Layout: Two level, Able to live on main level with bedroom/bathroom (6yo son's room upstairs) Alternate Level Stairs-Number of Steps: flight Alternate Level Stairs-Rails: Right Home Equipment: None  Functional History: Prior Function Level of Independence: Independent Functional Status:  Mobility: Bed Mobility Overal bed mobility: Needs Assistance Bed Mobility: Rolling, Sidelying to Sit Rolling: Mod assist Sidelying to sit: Mod assist General bed mobility comments: cues for log roll technique; mod assist to clear feet from EOB and elevate trunk to sitting Transfers Overall transfer level: Needs assistance Equipment used: Rolling walker (2 wheeled) Transfers: Sit to/from Stand Sit to Stand: Mod assist General transfer comment: Cues for technqiue, safety, and hand palcement Ambulation/Gait Ambulation/Gait assistance: Min assist Ambulation Distance (Feet): 8 Feet Assistive device: Rolling walker (2 wheeled) Gait Pattern/deviations: Step-to pattern, Decreased stride length Gait velocity: quite slow General Gait Details: Cues for body position in relation to RW; noted heavy dependence on UE support on RW    ADL:    Cognition: Cognition Overall Cognitive Status: Within Functional Limits for tasks assessed Orientation Level: Oriented X4 Cognition Arousal/Alertness: Awake/alert Behavior During Therapy: WFL for tasks assessed/performed Overall Cognitive Status: Within Functional Limits for tasks assessed  Blood pressure 127/79, pulse 72, temperature 97.8 F (36.6 C), temperature source Oral, resp. rate 18, height 6' (1.829 m), weight 93.3 kg (205 lb 11 oz), SpO2 98 %. Physical Exam  Constitutional: He is oriented to person, place, and time. He appears well-developed.  HENT:  Head: Normocephalic.  Eyes: EOM are normal.  Neck: Normal range of motion. Neck supple. No thyromegaly present.   Cardiovascular: Normal rate and regular rhythm.  Respiratory: Effort normal and breath sounds normal. No respiratory distress.  GI: Soft. Bowel sounds are normal. He exhibits no distension.  Neurological: He is alert and oriented to person, place, and time.  Skin: Skin is warm and dry.     Lab Results Last 24 Hours    Results for orders placed or performed during the hospital encounter of 02/26/14 (from the past 24 hour(s))  Glucose, capillary Status: Abnormal   Collection Time: 03/05/14 11:32 AM  Result Value Ref Range   Glucose-Capillary 219 (H) 70 - 99 mg/dL  Glucose, capillary Status: Abnormal   Collection Time: 03/05/14 4:02 PM  Result Value Ref Range   Glucose-Capillary 213 (H) 70 - 99 mg/dL  Glucose, capillary Status: Abnormal   Collection Time: 03/05/14 9:49 PM  Result Value Ref Range   Glucose-Capillary 202 (H) 70 - 99 mg/dL  Glucose, capillary Status: Abnormal   Collection Time: 03/06/14 5:50 AM  Result Value Ref Range   Glucose-Capillary 155 (H) 70 - 99 mg/dL  CBC Status: Abnormal   Collection Time: 03/06/14 6:03 AM  Result Value Ref Range   WBC 10.7 (H) 4.0 - 10.5 K/uL   RBC 4.01 (L) 4.22 - 5.81 MIL/uL   Hemoglobin 12.3 (L) 13.0 - 17.0 g/dL   HCT 36.4 (L) 39.0 - 52.0 %   MCV 90.8 78.0 - 100.0 fL   MCH 30.7 26.0 - 34.0 pg   MCHC 33.8 30.0 - 36.0 g/dL   RDW 12.6 11.5 - 15.5 %   Platelets 137 (L) 150 - 400 K/uL      Imaging Results (Last 48 hours)    Mr [facility]  Wo Contrast  03/04/2014 CLINICAL DATA: Shower of emboli to the brain from mitral valve endocarditis. Evaluate for mycotic aneurysm. Enterococcal bacteremia. EXAM: MRA HEAD WITHOUT CONTRAST TECHNIQUE: Angiographic images of the Circle of Willis were obtained using MRA technique without intravenous contrast. COMPARISON: MRI brain performed 03/01/2014. FINDINGS: Motion degraded exam results in  artifactual signal loss in the carotid siphon regions. Internal carotid arteries are widely patent based on source image examination. Projection images show artifactual misregistration and signal loss. Basilar artery widely patent with vertebrals codominant. No proximal stenosis of the anterior, middle, or posterior cerebral arteries. The distal MCA and PCA branches are carefully examined and there is no evidence for mycotic aneurysm. None is seen on axial source images as well. IMPRESSION: Negative for proximal stenosis or mycotic aneurysm. Electronically Signed By: Rolla Flatten M.D. On: 03/04/2014 17:43   Dg Chest Port 1 View  03/04/2014 CLINICAL DATA: Right PICC line placement EXAM: PORTABLE CHEST - 1 VIEW COMPARISON: 03/03/2014 FINDINGS: Right PICC line tip lower SVC level. Stable cardiomegaly with vascular congestion. No focal pneumonia, collapse or consolidation. Negative for edema, effusion or pneumothorax. Trachea midline. Improved edema pattern compared to yesterday. IMPRESSION: Right PICC line tip lower SVC level. Cardiomegaly with improving edema. Electronically Signed By: Daryll Brod M.D. On: 03/04/2014 12:29     Assessment/Plan: Diagnosis: septic emboli to the brain from mitral valve endocarditis 1. Does the need for close, 24 hr/day medical supervision in concert with the patient's rehab needs make it unreasonable for this patient to be served in a less intensive setting? Yes 2. Co-Morbidities requiring supervision/potential complications: severe low back pain, lumbar spondylosis 3. Due to bladder management, bowel management, safety, skin/wound care, disease management, medication administration, pain management and patient education, does the patient require 24 hr/day rehab nursing? Yes 4. Does the patient require coordinated care of a physician, rehab nurse, PT (1-2 hrs/day, 5 days/week) and OT (1-2 hrs/day, 5 days/week) to address physical and functional  deficits in the context of the above medical diagnosis(es)? Yes Addressing deficits in the following areas: balance, endurance, locomotion, strength, transferring, bowel/bladder control, bathing, dressing, feeding, grooming, toileting and pain mgt 5. Can the patient actively participate in an intensive therapy program of at least 3 hrs of therapy per day at least 5 days per week? Yes 6. The potential for patient to make measurable gains while on inpatient rehab is excellent 7. Anticipated functional outcomes upon discharge from inpatient rehab are modified independent with PT, modified independent with OT, n/a with SLP. 8. Estimated rehab length of stay to reach the above functional goals is: 7-10 days 9. Does the patient have adequate social supports and living environment to accommodate these discharge functional goals? Yes 10. Anticipated D/C setting: Home 11. Anticipated post D/C treatments: HH therapy and Outpatient therapy 12. Overall Rehab/Functional Prognosis: excellent  RECOMMENDATIONS: This patient's condition is appropriate for continued rehabilitative care in the following setting: CIR Patient has agreed to participate in recommended program. Yes Note that insurance prior authorization may be required for reimbursement for recommended care.  Comment: Spent extensive time reviewing Mr. Geffre' MRI. His back is more of a functional problem than anything else at present. Recommend aggressive OOB, pain mgt for now. Rehab Admissions Coordinator to follow up.  Thanks,  Meredith Staggers, MD, [facility]     03/06/2014       Revision History     Date/Time User Provider Type Action   03/06/2014 4:03 PM Meredith Staggers, MD Physician Sign   03/06/2014 1:46  PM Cathlyn Parsons, PA-C Physician Assistant Pend   View Details Report       Routing History     Date/Time From To Method   03/06/2014 4:03 PM Meredith Staggers, MD Meredith Staggers, MD In Basket   03/06/2014 4:03  PM Meredith Staggers, MD Orpah Melter, MD Fax

## 2014-03-08 NOTE — Progress Notes (Signed)
Physical Therapy Treatment Patient Details Name: [name] MRN: [id number] DOB: [birth year] Today's Date: 03/08/2014    History of Present Illness 48 year old male with history of hypertension, hyperlipidemia, diabetes mellitus, scoliosis recent removal of multiple lipomas, solitary kidney presented to the ED with intractable low back pain. On further evaluation he reported having low-grade subjective fever for 5-6 weeks with a recent history of overseas travel. Following hospitalization patient was found to be septic with high-grade fever, developed seizures with decompensation requiring intubation and mechanical ventilation. MRI of brain showed multiple small areas of acute bilateral infarct. 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    PT Comments    Patient progressing towards physical therapy goals, ambulating up to 65 feet today with min guard assist for patient safety. Weakness noted with manual muscle test of Rt knee extension 4/5, Rt hip flexion 4-/5, Lt great toe extension 4/5, and left knee flexion 4/5. Reports normal light touch sensation throughout LEs. Patient will continue to benefit from skilled physical therapy services to further improve independence with functional mobility.   Follow Up Recommendations  CIR     Equipment Recommendations  Rolling walker with 5" wheels;3in1 (PT)    Recommendations for Other Services       Precautions / Restrictions Precautions Precautions: Fall;Back Precaution Booklet Issued: No Precaution Comments: Reviewed back precautions.   Restrictions Weight Bearing Restrictions: No    Mobility  Bed Mobility Overal bed mobility: Needs Assistance Bed Mobility: Rolling;Sidelying to Sit Rolling: Supervision Sidelying to sit: Min assist       General bed mobility comments: Supervision with cues for technique during log roll. Min assist for truncal support into seated position. Complains of significant pain during this  task.  Transfers Overall transfer level: Needs assistance Equipment used: Rolling walker (2 wheeled) Transfers: Sit to/from Stand Sit to Stand: Min assist         General transfer comment: Min assist for boost to stand. VC for hand placement and to maintain neutral back alignment. Very slow to rise with increased pain. Good balance once holding a rolling walker.  Ambulation/Gait Ambulation/Gait assistance: Min guard Ambulation Distance (Feet): 65 Feet Assistive device: Rolling walker (2 wheeled) Gait Pattern/deviations: Step-through pattern;Decreased stride length;Trunk flexed   Gait velocity interpretation: Below normal speed for age/gender General Gait Details: Slow and guarded. Leans heavily on RW, but improves with cues to relax shoulders and accept weight through LEs. Reports LE weakness. No buckling noted. VC for upright posture at times.   Stairs            Wheelchair Mobility    Modified Rankin (Stroke Patients Only)       Balance                                    Cognition Arousal/Alertness: Awake/alert Behavior During Therapy: WFL for tasks assessed/performed Overall Cognitive Status: Within Functional Limits for tasks assessed                      Exercises      General Comments        Pertinent Vitals/Pain Pain Assessment: 0-10 Pain Score:  ("a little better today" No value given) Pain Location: low back Pain Descriptors / Indicators: Aching;Guarding Pain Intervention(s): Monitored during session;Limited activity within patient's tolerance;Repositioned    Home Living     Available Help at Discharge: Other (Comment) (wife avialable 24/7 initially. She is  a Pharmacist, hospital for an [employer])           Additional Comments: wif state s4 poster high bed an issue for his back. She is redoing the mattress to lessen the height of the bed    Prior Function            PT Goals (current goals can now be found in the care plan  section) Acute Rehab PT Goals Patient Stated Goal: less pain PT Goal Formulation: With patient Time For Goal Achievement: 03/19/14 Potential to Achieve Goals: Good Progress towards PT goals: Progressing toward goals    Frequency  Min 5X/week    PT Plan Current plan remains appropriate    Co-evaluation             End of Session Equipment Utilized During Treatment: Gait belt Activity Tolerance: Patient limited by pain Patient left: with call bell/phone within reach;in chair     Time: 1205-1221 PT Time Calculation (min) (ACUTE ONLY): 16 min  Charges:  $Gait Training: 8-22 mins                    G Codes:      Ellouise Newer April 06, 2014, 1:38 PM  Camille Bal [place], [place]

## 2014-03-08 NOTE — Progress Notes (Signed)
Patient and wife oriented to room and unit.  Vitals obtained; patient complains of pain in lower back that has been present since admission.  Will continue to monitor.

## 2014-03-08 NOTE — PMR Pre-admission (Signed)
PMR Admission Coordinator Pre-Admission Assessment  Patient: [name] is an 48 y.o., male MRN: [id number] DOB: [birth year] Height: 6' (182.9 cm) Weight: 93.3 kg (205 lb 11 oz)              Insurance Information HMO:     PPO: yes     PCP:      IPA:      80/20:      OTHER:  PRIMARY: [place]      Policy#: [id number]      Subscriber: pt CM Name: [name].      Phone#: [phone]   Ext [phone]  Fax#: [phone] Pre-Cert#: [id number]      Employer: [employer] approved 12/9 through 12/15 when update due Benefits:  Phone #: [phone]     Name: 04/07/13 Eff. Date: 03/31/12     Deduct: $200 met      Out of Pocket Max: $2000 met $1982.67      Life Max: none CIR: 90%      SNF: 90% 100 days per year Outpatient: $25 copay per visit     Co-Pay: 60 visits combined Home Health: 90%      Co-Pay: 120 visits combined DME: 90%     Co-Pay: 10% Providers: in network  SECONDARY: none        Medicaid Application Date:       Case Manager:  Disability Application Date:       Case Worker:   Emergency Facilities manager Information    Name Relation Home Work Mobile   Hendricksen,[name] [phone]  [phone]     Current Medical History  Patient Admitting Diagnosis: 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 Independent prior to admission living with his wife and 39-year-old son. Admitted 02/27/2014 with intractable low back pain 4 days denying any recent trauma as well as fever of 105 rectally and rigors. Patient works for [employer] travels internationally recently returning from [place]. MRI lumbar spine with no acute abnormalities noted degenerative disc changes L3-4. Sedimentation rate 56. Patient developed seizures with decompensation requiring intubation mechanical ventilation. MRI showed small areas of acute infarct bilaterally  consistent with cerebral emboli. MRA of the head negative. Echocardiogram ejection fraction 35% with grade 1 diastolic dysfunction. Carotid Dopplers no ICA stenosis. Infectious disease consulted for workup of fever TEE completed suggested mitral valve endocarditis. Blood cultures gram-positive cocci in chains. Cardiothoracic surgery Dr. Servando Snare consulted no surgical intervention at this time. Patient currently maintained on Rocephin as well as ampicillin 6 weeks. EEG consistent with nonspecific cerebral dysfunction encephalopathy.   Pt to receive IV antibiotics for 6 weeks and to follow up with thoracic surgery as an outpatient.  Nonischemic cardiomyopathy likely in the setting of acute/subacute endocarditis. Echo showing LV dysfunction with EF of 30-35% and moderate mitral regurgitation and global hypokinesis. Continue metoprolol, ACE inhibitor and Lipitor. No strict indication for ASA. Will avoid given higher bleeding risk.  Type 2 DM uncontrolled . Hgb A1C 10.3. Pt previously on oral agents. Now new to insulin and closer monitoring.  Admission MRI of the lumbar spine showing disc protrusion at L3-L4 with L3 nerve root pressure. No findings of discitis or abscess. Pain control with Robaxin and oxycodone. ( dose increased) . Able to ambulate short distances with a walker but needing support. Ambulation limited due to back pain. Repeat MRI of the lumbar spine  w/o contrast done on 12/8 showing early diskitis vs osteomyelitis over L3-L4 with adjacent right paraspinal edema. D/w ID. No further recommendations. Should continue empiric abx. Low yield in obtaining fluid for culture. He can follow-up with neurosurgery Dr Kathyrn Sheriff as outpt.   Past Medical History  Past Medical History  Diagnosis Date  . Diabetes mellitus without complication     type 2  . Hyperlipidemia   . Allergic rhinitis   . lipoma   . H/O scoliosis   . Hypertension     Diagnostic exercise tolerance test  assessment:04/30/2010 : comments normal -no evidence os ischemia by ST analysis  . Solitary kidney   . Wilm's tumor (nephroblastoma)     Family History  family history includes Heart disease in his father.  Prior Rehab/Hospitalizations: none  Current Medications  Current facility-administered medications: acetaminophen (TYLENOL) tablet 650 mg, 650 mg, Oral, Q6H PRN, Wilhelmina Mcardle, MD;  albuterol (PROVENTIL) (2.5 MG/3ML) 0.083% nebulizer solution 2.5 mg, 2.5 mg, Inhalation, Q6H PRN, Theressa Millard, MD;  ampicillin (OMNIPEN) 2 g in sodium chloride 0.9 % 50 mL IVPB, 2 g, Intravenous, 6 times per day, Campbell Riches, MD, 2 g at 03/08/14 0819 atorvastatin (LIPITOR) tablet 10 mg, 10 mg, Oral, q1800, Wilhelmina Mcardle, MD, 10 mg at 03/07/14 1717;  cefTRIAXone (ROCEPHIN) 2 g in dextrose 5 % 50 mL IVPB - Premix, 2 g, Intravenous, Q12H, [name], NP, 2 g at 03/08/14 0952;  fentaNYL (SUBLIMAZE) injection 12.5-25 mcg, 12.5-25 mcg, Intravenous, Q2H PRN, Wilhelmina Mcardle, MD, 25 mcg at 03/08/14 0952 fluticasone (FLOVENT HFA) 110 MCG/ACT inhaler 2 puff, 2 puff, Inhalation, BID, Nishant Dhungel, MD, 2 puff at 03/08/14 0853;  insulin aspart (novoLOG) injection 0-15 Units, 0-15 Units, Subcutaneous, TID WC, Wilhelmina Mcardle, MD, 2 Units at 03/07/14 1717;  insulin aspart (novoLOG) injection 0-5 Units, 0-5 Units, Subcutaneous, QHS, Wilhelmina Mcardle, MD, 0 Units at 03/03/14 2206 insulin aspart (novoLOG) injection 6 Units, 6 Units, Subcutaneous, TID WC, Nishant Dhungel, MD, 6 Units at 03/08/14 0930;  insulin glargine (LANTUS) injection 25 Units, 25 Units, Subcutaneous, QHS, Nishant Dhungel, MD, 25 Units at 03/07/14 2211;  lisinopril (PRINIVIL,ZESTRIL) tablet 2.5 mg, 2.5 mg, Oral, Daily, Dorris Carnes V, MD, 2.5 mg at 03/08/14 0951;  LORazepam (ATIVAN) injection 2 mg, 2 mg, Intravenous, PRN, Wilhelmina Mcardle, MD methocarbamol (ROBAXIN) tablet 500 mg, 500 mg, Oral, Q6H PRN, Louellen Molder, MD, 500 mg at 03/07/14 2056;   metoprolol tartrate (LOPRESSOR) tablet 25 mg, 25 mg, Oral, BID, Wilhelmina Mcardle, MD, 25 mg at 03/08/14 0951;  [DISCONTINUED] ondansetron (ZOFRAN) tablet 4 mg, 4 mg, Oral, Q6H PRN **OR** ondansetron (ZOFRAN) injection 4 mg, 4 mg, Intravenous, Q6H PRN, Theressa Millard, MD oxyCODONE (Oxy IR/ROXICODONE) immediate release tablet 10 mg, 10 mg, Oral, Q4H PRN, Nishant Dhungel, MD, 10 mg at 03/08/14 0952;  sodium chloride 0.9 % injection 10-40 mL, 10-40 mL, Intracatheter, Q12H, Jacolyn Reedy, MD, 10 mL at 03/08/14 3546;  sodium chloride 0.9 % injection 10-40 mL, 10-40 mL, Intracatheter, PRN, Jacolyn Reedy, MD, 10 mL at 03/04/14 2237  Patients Current Diet: Diet heart healthy/carb modified  Precautions / Restrictions Precautions Precautions: Fall, Back Precaution Booklet Issued: No Precaution Comments: Reviewed back precautions.   Restrictions Weight Bearing Restrictions: No   Prior Activity Level Community (5-7x/wk): very active. 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 Travels internationally frequently   Development worker, international aid / [place] Devices/Equipment: None, CBG Meter Home Equipment: None  Prior Functional Level  Prior Function Level of Independence: Independent Comments: Pt works for [employer] in [place] and travels world wide frequently.    Current Functional Level Cognition  Overall Cognitive Status: Within Functional Limits for tasks assessed (basic cognition appears intact) Orientation Level: Oriented X4    Extremity Assessment (includes Sensation/Coordination)          ADLs  Overall ADL's : Needs assistance/impaired Eating/Feeding: Set up, Bed level Grooming: Wash/dry hands, Wash/dry face, Oral care, Brushing hair, Applying deodorant, Set up, Bed level Upper Body Bathing: Minimal assitance, Bed level Lower Body Bathing: Maximal assistance, Sit to/from stand, Bed level Lower Body Bathing Details (indicate cue type and reason): Pt  unable to unweight unilateral UE to perform ADLs in seated position due to severity of back pain Upper Body Dressing : Total assistance, Sitting Lower Body Dressing: Total assistance, Sit to/from stand Lower Body Dressing Details (indicate cue type and reason): unable to access bil. LEs due to increased pain  Toilet Transfer: Minimal assistance, Ambulation, Comfort height toilet, BSC Toilet Transfer Details (indicate cue type and reason): Moves slowly and in guarded fashion due to pain.  Requires signficant amount of time.  Toileting- Clothing Manipulation and Hygiene: Total assistance, Sit to/from stand Functional mobility during ADLs: Minimal assistance, Rolling walker General ADL Comments: Pt limited by severity of back pain.  He is very motivated    Mobility  Overal bed mobility: Needs Assistance Bed Mobility: Rolling, Sidelying to Sit, Sit to Sidelying Rolling: Min assist Sidelying to sit: Mod assist Sit to sidelying: Mod assist General bed mobility comments: heavily dependent upon bed rail.  Pt moves slowly due to pain.  requires assist for LEs and to raise trunk from bed     Transfers  Overall transfer level: Needs assistance Equipment used: Rolling walker (2 wheeled) Transfers: Sit to/from Stand, Stand Pivot Transfers Sit to Stand: Min assist Stand pivot transfers: Min assist General transfer comment: Pt requires increased time and assist to move sit to stand and to pivot.      Ambulation / Gait / Stairs / Wheelchair Mobility  Ambulation/Gait Ambulation/Gait assistance: Museum/gallery curator (Feet): 40 Feet Assistive device: Rolling walker (2 wheeled) Gait Pattern/deviations: Step-through pattern, Decreased stride length Gait velocity: quite slow General Gait Details: pt leans heavilyon RW 2/2 back pain.  difficult for pt to maintain upright psoture.      Posture / Balance Dynamic Sitting Balance Sitting balance - Comments: heavily reliant on bil. UEs    Special  needs/care consideration Bowel mgmt: continent Bladder mgmt: continent Diabetic mgmt oral agents for DM pta. HgbA1c 10.3. New to insulin   Previous Home Environment Living Arrangements: [name]/significant other  Lives With: [name], Son (42 yo son) Available Help at Discharge: Other (Comment) (wife avialable 24/7 initially. She is a Pharmacist, hospital for an autic) Type of Home: House Home Layout: Two level, Able to live on main level with bedroom/bathroom Alternate Level Stairs-Rails: Right Alternate Level Stairs-Number of Steps: flight Home Access: Stairs to enter Entrance Stairs-Rails: None Entrance Stairs-Number of Steps: 5 Bathroom Shower/Tub: Multimedia programmer: Standard Bathroom Accessibility: Yes How Accessible: Accessible via walker Imperial: No Additional Comments: wif state s4 poster high bed an issue for his back. She is redoing the mattress to lessen the height of the bed  Discharge Living Setting Plans for Discharge Living Setting: Patient's home, Lives with (comment), Other (Comment) (wife and 85 yo old son) Type of Home at Discharge: House Discharge Home Layout: Two level, Full bath on  main level, Able to live on main level with bedroom/bathroom Alternate Level Stairs-Rails: Right Alternate Level Stairs-Number of Steps: flight Discharge Home Access: Stairs to enter Entrance Stairs-Rails: None Entrance Stairs-Number of Steps: 5 steps Discharge Bathroom Shower/Tub: Walk-in shower, Other (comment) (does not have a seat) Discharge Bathroom Toilet: Standard Discharge Bathroom Accessibility: Yes How Accessible: Accessible via walker Does the patient have any problems obtaining your medications?: No  Social/Family/Support Systems Patient Roles: [name], Parent, Other (Comment) (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 Hendricks Limes) Contact Information: [place], wife Anticipated Caregiver: wife Anticipated Caregiver's Contact Information: see  above Ability/Limitations of Caregiver: wife is a Pharmacist, hospital for an autistic child but plans to take off initally Caregiver Availability: 24/7 Discharge Plan Discussed with Primary Caregiver: Yes Is Caregiver In Agreement with Plan?: Yes Does Caregiver/Family have Issues with Lodging/Transportation while Pt is in Rehab?: No  Goals/Additional Needs Patient/Family Goal for Rehab: Mod I with PT and OT Expected length of stay: ELOS 7 to 10 days Special Service Needs: Pt to d/c home on 6 weeks of IV antibiotics. Wife plans to be taught to administer. Pt has A PICC line pta to CIR Pt/Family Agrees to Admission and willing to participate: Yes Program Orientation Provided & Reviewed with Pt/Caregiver Including Roles  & Responsibilities: Yes  Decrease burden of Care through IP rehab admission: n/a  Possible need for SNF placement upon discharge:no  Patient Condition: This patient's medical and functional status has changed since the consult dated: 03/06/14 in which the Rehabilitation Physician determined and documented that the patient's condition is appropriate for intensive rehabilitative care in an inpatient rehabilitation facility. See "History of Present Illness" (above) for medical update. Functional changes are: min to mod assist. Patient's medical and functional status update has been discussed with the Rehabilitation physician and patient remains appropriate for inpatient rehabilitation. Will admit to inpatient rehab today.  Preadmission Screen Completed By:  Cleatrice Burke, 03/08/2014 11:56 AM ______________________________________________________________________   Discussed status with Dr. Naaman Plummer on 03/08/14 at 1156 and received telephone approval for admission today.  Admission Coordinator:  Cleatrice Burke, time 7253 Date 03/08/2014.

## 2014-03-08 NOTE — Clinical Social Work Note (Signed)
CSW received referral for SNF.  Case discussed with case manager, rehab admissions, and family.  Plan is to discharge to inpatient rehab.  CSW to sign off please re-consult if social work needs arise.  Bulluck Broom. [place], MSW, [place]

## 2014-03-09 ENCOUNTER — Inpatient Hospital Stay (HOSPITAL_COMMUNITY): Payer: BC Managed Care – PPO | Admitting: Occupational Therapy

## 2014-03-09 ENCOUNTER — Inpatient Hospital Stay (HOSPITAL_COMMUNITY): Payer: BC Managed Care – PPO | Admitting: Physical Therapy

## 2014-03-09 LAB — COMPREHENSIVE METABOLIC PANEL
ALK PHOS: 57 U/L (ref 39–117)
ALT: 16 U/L (ref 0–53)
AST: 15 U/L (ref 0–37)
Albumin: 2.5 g/dL — ABNORMAL LOW (ref 3.5–5.2)
Anion gap: 12 (ref 5–15)
BUN: 14 mg/dL (ref 6–23)
CALCIUM: 9.1 mg/dL (ref 8.4–10.5)
CHLORIDE: 94 meq/L — AB (ref 96–112)
CO2: 26 meq/L (ref 19–32)
Creatinine, Ser: 0.86 mg/dL (ref 0.50–1.35)
GLUCOSE: 123 mg/dL — AB (ref 70–99)
POTASSIUM: 4.2 meq/L (ref 3.7–5.3)
SODIUM: 132 meq/L — AB (ref 137–147)
Total Bilirubin: 0.5 mg/dL (ref 0.3–1.2)
Total Protein: 6.5 g/dL (ref 6.0–8.3)

## 2014-03-09 LAB — CBC WITH DIFFERENTIAL/PLATELET
Basophils Absolute: 0 10*3/uL (ref 0.0–0.1)
Basophils Relative: 0 % (ref 0–1)
Eosinophils Absolute: 0.2 10*3/uL (ref 0.0–0.7)
Eosinophils Relative: 2 % (ref 0–5)
HCT: 37 % — ABNORMAL LOW (ref 39.0–52.0)
Hemoglobin: 12.3 g/dL — ABNORMAL LOW (ref 13.0–17.0)
Lymphocytes Relative: 12 % (ref 12–46)
Lymphs Abs: 0.8 10*3/uL (ref 0.7–4.0)
MCH: 30.3 pg (ref 26.0–34.0)
MCHC: 33.2 g/dL (ref 30.0–36.0)
MCV: 91.1 fL (ref 78.0–100.0)
Monocytes Absolute: 1 10*3/uL (ref 0.1–1.0)
Monocytes Relative: 14 % — ABNORMAL HIGH (ref 3–12)
Neutro Abs: 4.8 10*3/uL (ref 1.7–7.7)
Neutrophils Relative %: 72 % (ref 43–77)
Platelets: 145 10*3/uL — ABNORMAL LOW (ref 150–400)
RBC: 4.06 MIL/uL — ABNORMAL LOW (ref 4.22–5.81)
RDW: 13 % (ref 11.5–15.5)
WBC: 6.8 10*3/uL (ref 4.0–10.5)

## 2014-03-09 LAB — GLUCOSE, CAPILLARY
GLUCOSE-CAPILLARY: 125 mg/dL — AB (ref 70–99)
Glucose-Capillary: 147 mg/dL — ABNORMAL HIGH (ref 70–99)
Glucose-Capillary: 128 mg/dL — ABNORMAL HIGH (ref 70–99)
Glucose-Capillary: 128 mg/dL — ABNORMAL HIGH (ref 70–99)

## 2014-03-09 MED ORDER — SODIUM CHLORIDE 0.9 % IJ SOLN
10.0000 mL | Freq: Two times a day (BID) | INTRAMUSCULAR | Status: DC
  Administered 2014-03-09: 10 mL

## 2014-03-09 MED ORDER — SODIUM CHLORIDE 0.9 % IJ SOLN
10.0000 mL | INTRAMUSCULAR | Status: DC | PRN
  Administered 2014-03-09 – 2014-03-14 (×4): 10 mL
  Filled 2014-03-09 (×3): qty 40

## 2014-03-09 NOTE — Progress Notes (Signed)
Social Work Assessment and Plan Social Work Assessment and Plan  Patient Details  Name: [name] MRN: [id number] Date of Birth: 09-22-1965  Today's Date: 03/09/2014  Problem List:  Patient Active Problem List   Diagnosis Date Noted  . Discitis of lumbar region 03/08/2014  . Cerebral septic emboli   . Enterococcal bacteremia 03/07/2014  . Diskitis 03/07/2014  . Osteomyelitis of lumbar vertebra 03/07/2014  . Solitary kidney, acquired 03/07/2014  . Acute encephalopathy 03/07/2014  . Acute bacterial endocarditis   . Malnutrition of moderate degree 03/01/2014  . Cardiomyopathy- EF 30-35% 03/01/2014  . CVA (cerebral infarction)- embolic 67/89/3810  . Acute respiratory failure with hypoxia   . Gram-positive bacteremia   . Respiratory failure, acute   . Severe sepsis   . Acute respiratory failure   . Back pain 02/27/2014  . Intractable back pain 02/27/2014  . Hyperglycemia 02/27/2014  . DM (diabetes mellitus), type 2, uncontrolled 02/27/2014  . Hyperlipidemia 02/27/2014  . Family history of ischemic heart disease 04/05/2013  . HYPERTENSION, BENIGN 03/12/2008  . PALPITATIONS 03/12/2008   Past Medical History:  Past Medical History  Diagnosis Date  . Diabetes mellitus without complication     type 2  . Hyperlipidemia   . Allergic rhinitis   . lipoma   . H/O scoliosis   . Hypertension     Diagnostic exercise tolerance test assessment:04/30/2010 : comments normal -no evidence os ischemia by ST analysis  . Solitary kidney   . Wilm's tumor (nephroblastoma)    Past Surgical History:  Past Surgical History  Procedure Laterality Date  . Lipoma excision    . Total nephrectomy Left   . Thyroid surgery    . Knee surgery     Social History:  reports that he has never smoked. He has never used smokeless tobacco. He reports that he does not drink alcohol or use illicit drugs.  Family / Support Systems Marital Status: Married Patient Roles: Spouse, Parent, Other (Comment)  (Employee) Spouse/Significant Other: Cyril Mourning [phone] Children: 30 yo son Other Supports: Friends Anticipated Caregiver: Wife who plans to take some time off to provide care to pt Ability/Limitations of Caregiver: Plans to take time off for a short time Caregiver Availability: 24/7 Family Dynamics: Close knit small family unit, who are supportive of one another and involved with each other.  Both sets of parents are in the [place] area and have been here but since have gone back.  They are supportive of pt and family.  Social History Preferred language: English Religion:  Cultural Background: No issues Education: Secretary/administrator educated Read: Yes Write: Yes Employment Status: Employed Name of Employer: [employer] Return to Work Plans: Plans to return back to work when able Freight forwarder Issues: No issues Guardian/Conservator: None-accroding to MD pt is  capable of making his own decisions while here.   Abuse/Neglect Physical Abuse: Denies Verbal Abuse: Denies Sexual Abuse: Denies Exploitation of patient/patient's resources: Denies Self-Neglect: Denies  Emotional Status Pt's affect, behavior adn adjustment status: Pt is motivated and surprised how well his first day of rehab went today.  He wants to regain as much of his function as he can while here before going home.  He has always been independent and cared for himself, this is new for him. Recent Psychosocial Issues: healthy prior to admission Pyschiatric History: No history deferred depression screen at this time due to feeling doing well, would benefit from Neuro-psyhc intervention at some point in his stay and testing for returning  back to work. Substance Abuse History: No issues  Patient / Family Perceptions, Expectations & Goals Pt/Family understanding of illness & functional limitations: Pt and wife have a good understnaidng of his conditon.  Pt repoerts: " I was  out of it until a few days ago, but have been told how I was."  Wife speaks with the MD and staff and feels her questions are being answered.  Both read the information provided also. 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, etc Anticipated changes in roles/activities/participation: resume Pt/family expectations/goals: Pt states: " I want to be able to take care of myself, if I can by the time I leave here."  Wife states: " I hope he makes a lot of progress here and does well."  [employer]: None Premorbid Home Care/DME Agencies: None Transportation available at discharge: Wife Resource referrals recommended: Neuropsychology, Support group (specify)  Discharge Planning Living Arrangements: Spouse/significant other, Children Support Systems: Spouse/significant other, Children, Parent, Other relatives, Friends/neighbors, Social worker community Type of Residence: Private residence Insurance Resources: Multimedia programmer (specify) Nurse, mental health) Financial Resources: Employment, Secondary school teacher Screen Referred: No Living Expenses: Higher education careers adviser Management: Patient, Spouse Does the patient have any problems obtaining your medications?: No Home Management: Wife Patient/Family Preliminary Plans: Return home with wife who plans to take time off from tutoring to provide care to pt.  Pt is adjusting to the rehab program and reports his first day has gone well.  Will work on discharge plans and await team's evaluations. Social Work Anticipated Follow Up Needs: HH/OP, Support Group  Clinical Impression Very pleasant motivated gentleman who is doing well after all of his health issues.  He and wife are still processing all that has happened to him and feels very blessed to be doing so well. Will work on discharge plans, wife plans to take time off to be there with him at discharge.  Pt would benefit from Neuro-psych intervention due  to young age and plans to return to work. Provide support to both while here.  Elease Hashimoto 03/09/2014, 2:31 PM

## 2014-03-09 NOTE — Progress Notes (Signed)
Patient information reviewed and entered into eRehab system by Kaleo Condrey, RN, CRRN, PPS Coordinator.  Information including medical coding and functional independence measure will be reviewed and updated through discharge.    

## 2014-03-09 NOTE — Plan of Care (Signed)
Problem: RH BOWEL ELIMINATION Goal: RH STG MANAGE BOWEL WITH ASSISTANCE STG Manage Bowel with Assistance.  Outcome: Progressing  Problem: RH PAIN MANAGEMENT Goal: RH STG PAIN MANAGED AT OR BELOW PT'S PAIN GOAL Outcome: Progressing

## 2014-03-09 NOTE — Care Management Note (Signed)
Inpatient [facility] Individual Statement of Services  Patient Name:  [name]  Date:  03/09/2014  Welcome to the [facility].  Our goal is to provide you with an individualized program based on your diagnosis and situation, designed to meet your specific needs.  With this comprehensive rehabilitation program, you will be expected to participate in at least 3 hours of rehabilitation therapies Monday-Friday, with modified therapy programming on the weekends.  Your rehabilitation program will include the following services:  Physical Therapy (PT), Occupational Therapy (OT), Speech Therapy (ST), 24 hour per day rehabilitation nursing, Neuropsychology, Case Management (Social Worker), Rehabilitation Medicine, Nutrition Services and Pharmacy Services  Weekly team conferences will be held on Wednesday to discuss your progress.  Your Social Worker will talk with you frequently to get your input and to update you on team discussions.  Team conferences with you and your family in attendance may also be held.  Expected length of stay: 10-14 days  Overall anticipated outcome: supervision/mod/i level  Depending on your progress and recovery, your program may change. Your Social Worker will coordinate services and will keep you informed of any changes. Your Social Worker's name and contact numbers are listed  below.  The following services may also be recommended but are not provided by the [facility] will be made to provide these services after discharge if needed.  Arrangements include referral to agencies that provide these services.  Your insurance has been verified to be:  [place] Your primary doctor is:  Orpah Melter  Pertinent information will be shared with your doctor and your insurance  company.  Social Worker:  Ovidio Kin, [place] or ([phone]  Information discussed with and copy given to patient by: Elease Hashimoto, 03/09/2014, 12:14 PM

## 2014-03-09 NOTE — Evaluation (Signed)
Occupational Therapy Assessment and Plan  Patient Details  Name: [name] MRN: [id number] Date of Birth: 03-17-1966  OT Diagnosis: abnormal posture, acute pain and lumbago (low back pain) Rehab Potential:   ELOS: 10-14 days   Today's Date: 03/09/2014 OT Individual Time: 1100-1200 OT Individual Time Calculation (min): 60 min     Problem List:  Patient Active Problem List   Diagnosis Date Noted  . Discitis of lumbar region 03/08/2014  . Cerebral septic emboli   . Enterococcal bacteremia 03/07/2014  . Diskitis 03/07/2014  . Osteomyelitis of lumbar vertebra 03/07/2014  . Solitary kidney, acquired 03/07/2014  . Acute encephalopathy 03/07/2014  . Acute bacterial endocarditis   . Malnutrition of moderate degree 03/01/2014  . Cardiomyopathy- EF 30-35% 03/01/2014  . CVA (cerebral infarction)- embolic 61/22/4497  . Acute respiratory failure with hypoxia   . Gram-positive bacteremia   . Respiratory failure, acute   . Severe sepsis   . Acute respiratory failure   . Back pain 02/27/2014  . Intractable back pain 02/27/2014  . Hyperglycemia 02/27/2014  . DM (diabetes mellitus), type 2, uncontrolled 02/27/2014  . Hyperlipidemia 02/27/2014  . Family history of ischemic heart disease 04/05/2013  . HYPERTENSION, BENIGN 03/12/2008  . PALPITATIONS 03/12/2008    Past Medical History:  Past Medical History  Diagnosis Date  . Diabetes mellitus without complication     type 2  . Hyperlipidemia   . Allergic rhinitis   . lipoma   . H/O scoliosis   . Hypertension     Diagnostic exercise tolerance test assessment:04/30/2010 : comments normal -no evidence os ischemia by ST analysis  . Solitary kidney   . Wilm's tumor (nephroblastoma)    Past Surgical History:  Past Surgical History  Procedure Laterality Date  . Lipoma excision    . Total nephrectomy Left   . Thyroid surgery    . Knee surgery      Assessment & Plan Clinical Impression: Patient is a 48 y.o. right handed male  with history of diabetes mellitus, hypertension and scoliosis. Independent prior to admission living with his wife and 32-year-old son. Admitted 02/27/2014 with intractable low back pain 4 days denying any recent trauma as well as fever of 105 rectally and rigors. Patient works for [employer] and travels internationally recently returning from [place]. MRI lumbar spine 02/27/2014 with no acute abnormalities noted degenerative disc changes L3-4. Sedimentation rate 56. Patient developed seizures with decompensation requiring intubation mechanical ventilation. MRI showed small areas of acute infarct bilaterally consistent with cerebral emboli. MRA of the head negative. Echocardiogram ejection fraction 35% with grade 1 diastolic dysfunction as well as large echo density measuring 1.4 x 2 cm attached to the posterior leaflet of the mitral valve.. Carotid Dopplers no ICA stenosis. Infectious disease consulted for workup of fever TEE completed suggested mitral valve endocarditis. Venous Dopplers lower extremities negative for DVT. Blood cultures gram-positive cocci in chains. Cardiothoracic surgery Dr. Servando Snare consulted no surgical intervention at this time. Patient currently maintained on Rocephin as well as ampicillin until 04/15/2014 to complete a 6 week course. 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 Again contacts were made to infectious disease with recommendations to continue plan of care antibiotic therapy. EEG consistent with nonspecific cerebral dysfunction encephalopathy. Patient was later extubated slow progressive gains with therapy evaluation completed 03/05/2014 with recommendations of physical medicine rehabilitation consult.  Patient transferred to CIR on 03/08/2014 .    Patient currently requires max with basic self-care skills  secondary to muscle weakness and muscle joint tightness and decreased standing balance, decreased postural control and decreased balance strategies.  Prior to hospitalization, patient could complete ADLs with independent .  Patient will benefit from skilled intervention to increase independence with basic self-care skills prior to discharge home with care partner.  Anticipate patient will require intermittent supervision and follow up TBD.  OT - End of Session Activity Tolerance: Tolerates 30+ min activity with multiple rests Endurance Deficit: No OT Assessment Barriers to Discharge:  (None) OT Patient demonstrates impairments in the following area(s): Balance;Endurance;Motor;Safety OT Basic ADL's Functional Problem(s): Grooming;Bathing;Dressing;Toileting OT Transfers Functional Problem(s): Toilet;Tub/Shower OT Additional Impairment(s): None OT Plan OT Intensity: Minimum of 1-2 x/day, 45 to 90 minutes OT Frequency: 5 out of 7 days OT Duration/Estimated Length of Stay: 10-14 days OT Treatment/Interventions: Medical illustrator training;Community reintegration;Discharge planning;Disease mangement/prevention;DME/adaptive equipment instruction;Functional mobility training;Pain management;Patient/family education;Psychosocial support;Self Care/advanced ADL retraining;Therapeutic Activities;Therapeutic Exercise OT Self Feeding Anticipated Outcome(s): independent OT Basic Self-Care Anticipated Outcome(s): mod I OT Toileting Anticipated Outcome(s): mod I OT Bathroom Transfers Anticipated Outcome(s): mod I OT Recommendation Patient destination: Home Follow Up Recommendations: Other (comment) (TBD) Equipment Recommended: Tub/shower seat   Skilled Therapeutic Intervention OT eval completed, educated pt and pt's wife on OT role, goals, and rehab process.  ADL assessment completed EOB with focus on sitting balance, adherence to back precautions, sit <> stand, and stand pivot transfer.  Pt completed  bathing in unsupported sitting at EOB with pt reporting initial hesitancy with BUE use during sitting and standing.  Progressed to standing with mod assist sit > stand and min assist during standing to wash buttocks.  Required max assist to complete LB dressing. Educated on stand pivot transfer to [facility], pt required mod assist for lifting from low surface.  OT Evaluation Precautions/Restrictions  Precautions Precautions: Fall;Back Restrictions Weight Bearing Restrictions: No General   Vital Signs Therapy Vitals Temp: 98.2 F (36.8 C) Temp Source: Oral Pulse Rate: 74 Resp: 18 BP: 129/71 mmHg Patient Position (if appropriate): Lying Oxygen Therapy SpO2: 97 % O2 Device: Not Delivered Pain Pain Assessment Pain Score: 4  Pain Type: Acute pain Pain Location: Back Pain Orientation: Right;Left;Lower Pain Descriptors / Indicators: Aching Pain Onset: On-going Pain Intervention(s): Rest (premedicated) Home Living/Prior Functioning Home Living Living Arrangements: Spouse/significant other, Children Available Help at Discharge: Family, Available PRN/intermittently, Other (Comment) (wife available 24/7 initially, but will need to return to work in the new year) Type of Home: House Home Access: Stairs to enter Technical brewer of Steps: 2 Entrance Stairs-Rails: None Home Layout: Two level, Able to live on main level with bedroom/bathroom Alternate Level Stairs-Number of Steps: flight Alternate Level Stairs-Rails: Right Additional Comments: Pt has high bed an issue for his back. She is redoing the mattress to lessen the height of the bed  Lives With: Spouse, Son (3 yo son) IADL History Homemaking Responsibilities: Yes Meal Prep Responsibility: Secondary Laundry Responsibility: Secondary Bill Paying/Finance Responsibility: Primary Current License: Yes Mode of Transportation: Car Occupation: Full time employment Prior Function Level of Independence: Independent with basic ADLs,  Independent with homemaking with ambulation, Independent with gait, Independent with transfers  Able to Take Stairs?: Reciprically Driving: Yes Vocation: Full time employment Vocation Requirements: Pt works for [employer] in [place] and travels world wide frequently.   Leisure: Hobbies-yes (Comment) Comments: Works out 3 days/week at gym and loves to walk dog, play basketball, gold, and  with 37 yo son ADL  See FIM Vision/Perception  Vision- History Baseline Vision/History: Wears glasses Wears Glasses: Reading only Patient Visual Report: No change from baseline Vision- Assessment Vision Assessment?: Yes Eye Alignment: Within Functional Limits Ocular Range of Motion: Within Functional Limits Alignment/Gaze Preference: Within Defined Limits Tracking/Visual Pursuits: Able to track stimulus in all quads without difficulty  Cognition Overall Cognitive Status: Within Functional Limits for tasks assessed Arousal/Alertness: Awake/alert Orientation Level: Oriented X4 Attention: Selective Selective Attention: Appears intact Awareness: Appears intact Safety/Judgment: Appears intact Sensation Sensation Light Touch: Appears Intact Stereognosis: Not tested Hot/Cold: Not tested Proprioception: Appears Intact Coordination Gross Motor Movements are Fluid and Coordinated: No Fine Motor Movements are Fluid and Coordinated: Yes Coordination and Movement Description: Fluidity of movement limited by pain in lower back. 9 Hole Peg Test: Rt: 31 seconds, Lt: 37 seconds - noted slight shakiness with Lt hand that pt reports is new Motor  Motor Motor: Abnormal postural alignment and control Motor - Skilled Clinical Observations: Generalized weakness in trunk and LE's; transitional movements limited by pain in lower back. Extremity/Trunk Assessment RUE Assessment RUE Assessment: Within Functional Limits (strength grossly 5/5) LUE Assessment LUE Assessment: Within Functional Limits  (strength grossly 5/5)  FIM:  FIM - Eating Eating Activity: 7: Complete independence:no helper FIM - Grooming Grooming Steps: Wash, rinse, dry face;Wash, rinse, dry hands;Oral care, brush teeth, clean dentures Grooming: 4: Patient completes 3 of 4 or 4 of 5 steps FIM - Bathing Bathing Steps Patient Completed: Chest;Right Arm;Left Arm;Abdomen;Front perineal area;Right upper leg;Left upper leg Bathing: 3: Mod-Patient completes 5-7 [id number] parts or 50-74% FIM - Upper Body Dressing/Undressing Upper body dressing/undressing steps patient completed: Thread/unthread right sleeve of pullover shirt/dresss;Thread/unthread left sleeve of pullover shirt/dress;Put head through opening of pull over shirt/dress;Pull shirt over trunk Upper body dressing/undressing: 5: Set-up assist to: Obtain clothing/put away FIM - Lower Body Dressing/Undressing Lower body dressing/undressing steps patient completed: Pull underwear up/down;Pull pants up/down;Thread/unthread right underwear leg Lower body dressing/undressing: 2: Max-Patient completed 25-49% of tasks FIM - Bed/Chair Transfer Bed/Chair Transfer Assistive Devices: Bed rails;HOB elevated;Arm rests Bed/Chair Transfer: 4: Supine > Sit: Min A (steadying Pt. > 75%/lift 1 leg);4: Sit > Supine: Min A (steadying pt. > 75%/lift 1 leg);3: Bed > Chair or W/C: Mod A (lift or lower assist);3: Chair or W/C > Bed: Mod A (lift or lower assist) FIM - TRadio producerDevices: Bedside commode Toilet Transfers: 3-To toilet/BSC: Mod A (lift or lower assist);3-From toilet/BSC: Mod A (lift or lower assist) FIM - Tub/Shower Transfers Tub/shower Transfers: 0-Activity did not occur or was simulated   Refer to Care Plan for Long Term Goals  Recommendations for other services: None  Discharge Criteria: Patient will be discharged from OT if patient refuses treatment 3 consecutive times without medical reason, if treatment goals not met, if there is a change  in medical status, if patient makes no progress towards goals or if patient is discharged from hospital.  The above assessment, treatment plan, treatment alternatives and goals were discussed and mutually agreed upon: by patient and by family  Jerrian Mells, [facility]12/12/2013, 4:07 PM

## 2014-03-09 NOTE — Plan of Care (Signed)
Problem: RH PAIN MANAGEMENT Goal: RH STG PAIN MANAGED AT OR BELOW PT'S PAIN GOAL <7 Outcome: Progressing Pain rating 6

## 2014-03-09 NOTE — Progress Notes (Signed)
[facility] PHYSICAL MEDICINE & REHABILITATION     PROGRESS NOTE    Subjective/Complaints: Had a pretty good night once he found a comfortable position. Feels that he finally needs to move his bowels. Appetite good  Objective: Vital Signs: Blood pressure 130/82, pulse 86, temperature 98.1 F (36.7 C), temperature source Oral, resp. rate 18, height 6\' 6"  (1.981 m), weight 84.505 kg (186 lb 4.8 oz), SpO2 98 %. Mr Lumbar Spine Wo Contrast  03/07/2014   CLINICAL DATA:  Scoliosis. Low back pain. This has worsened over the past 4 days.  EXAM: MRI LUMBAR SPINE WITHOUT CONTRAST  TECHNIQUE: Multiplanar, multisequence MR imaging of the lumbar spine was performed. No intravenous contrast was administered.  COMPARISON:  02/27/2014  FINDINGS: The lowest lumbar type non-rib-bearing vertebra is labeled as L5. The conus medullaris appears normal. Conus level: L1-2.  The left kidney is absent with amorphous tissue in the expected left renal fossa probably reflecting bowel.  T 1 hypointense 2.2 by 1.4 cm lesion in the sacrum at the S1 level, technically nonspecific, but given the speckled appearance on axial images this is probably an atypical hemangioma.  Fluid signal in the narrow intervertebral disc at L3-4 with edema along the vertebral endplates at this level, similar to prior. There may be some low-level paravertebral edema on the right, as on image 16 of series 7. I do not observe an obvious abnormal epidural fluid collection in this area.  Dextroconvex lumbar scoliosis with significant rotary component.  Additional findings at individual levels are as follows:  L1-2:  No impingement.  Mild disc bulge.  L2-3:  No impingement.  Mild disc bulge.  L3-4: Moderate right foraminal stenosis due to facet arthropathy, moderate right and mild left foraminal stenosis due to intervertebral spurring, facet spurring, right foraminal disc protrusion, and underlying disc bulge. Left facet joint effusion noted.  L4-5: Moderate  bilateral foraminal stenosis due to disc bulge and facet arthropathy.  L5-S1: Mild right foraminal stenosis due to intervertebral spurring, facet spurring, and minimal disc bulge.  IMPRESSION: 1. Possible early discitis-osteomyelitis at L3-4. Adjacent right paraspinal edema at this level. Correlate with symptoms in determining the utility of disc aspiration. 2. Lumbar spondylosis and degenerative disc disease cause moderate impingement at L3-4 and L4-5, and mild impingement at L5-S1, as detailed above. This impingement is not appreciably changed compared to the prior exam. 3. Dextroconvex lumbar scoliosis with significant rotary component. 4. Suspected atypical hemangioma in the S1 level of the sacrum. 5. Absent left kidney.   Electronically Signed   By: Sherryl Barters M.D.   On: 03/07/2014 10:56    Recent Labs  03/09/14 0530  WBC 6.8  HGB 12.3*  HCT 37.0*  PLT 145*    Recent Labs  03/09/14 0530  NA 132*  K 4.2  CL 94*  GLUCOSE 123*  BUN 14  CREATININE 0.86  CALCIUM 9.1   CBG (last 3)   Recent Labs  03/08/14 1629 03/08/14 2102 03/09/14 0647  GLUCAP 148* 107* 147*    Wt Readings from Last 3 Encounters:  03/08/14 84.505 kg (186 lb 4.8 oz)  03/02/14 93.3 kg (205 lb 11 oz)  03/01/14 86.183 kg (190 lb)    Physical Exam:  Constitutional: Patient is cooperative with exam. He is oriented to person, place, and time. He appears well-developed. Appears to be more comfortable today. HENT: Tongue is midline/face is symmetrical Head: Normocephalic.  Eyes: EOM are normal.  Neck: Normal range of motion. Neck supple. No thyromegaly present.  Cardiovascular: Normal rate and regular rhythm.  Respiratory: Effort normal and breath sounds normal. No respiratory distress.  GI: Soft. Bowel sounds are normal. He exhibits no distension.  Neurological: He is alert and oriented to person, place, and time. CN exam intact. Appears to have normal insight and awareness. Memory functional.  UES: grossly 5/5 proximal to distal. LES: proximally inhibited by pain, 3- to 3/5 HF, 3+ KE, 5 ADF/APF. No sensory deficits. DTR's 1+ M/S: low back tender to palpation around the L3-L5 levels with some associated paraspinal spasm. Has substantial pain with proximal leg movement and with trunk movement.  Skin: Skin is warm and dry/ no breakdown Psych: mood pleasant and appropriate   Assessment/Plan: 1. Functional deficits secondary to septic bicerebral emboli, lumbar diskitis which require 3+ hours per day of interdisciplinary therapy in a comprehensive inpatient rehab setting. Physiatrist is providing close team supervision and 24 hour management of active medical problems listed below. Physiatrist and rehab team continue to assess barriers to discharge/monitor patient progress toward functional and medical goals. FIM:                   Comprehension Comprehension Mode: Auditory Comprehension: 5-Understands complex 90% of the time/Cues < 10% of the time  Expression Expression Mode: Verbal Expression: 5-Expresses complex 90% of the time/cues < 10% of the time  Social Interaction Social Interaction: 6-Interacts appropriately with others with medication or extra time (anti-anxiety, antidepressant).       Medical Problem List and Plan: 1. 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 DVT Prophylaxis/Anticoagulation: SCDs. Venous Doppler studies 03/06/2014 negative 3. Pain Management: Oxycodone and Robaxin as needed -encouraged him to take oxycodone prior to activity with therapy 4. ID/enterococcal bacteremia. Intravenous ampicillin and Rocephin until 04/15/2014 completing a six-week course.  -plan is for conservative care regarding his back as well unless symptoms and pathology progress. 5. Neuropsych: This patient is capable of making decisions on his own behalf. 6. Skin/Wound Care: Routine skin checks 7.  Fluids/Electrolytes/Nutrition: Strict I&O's. Follow-up chemistries normal. Appetite reasonable. 8. Diabetes mellitus. Hemoglobin A1c 10.3. NovoLog 6 units 3 times a day, Lantus insulin 25 units daily at bedtime.   -fair control at present 9. Hypertension. Lisinopril 2.5 mg daily, Lopressor 25 mg twice a day.    -control pain 10. Hyperlipidemia. Lipitor  LOS (Days) 1 A FACE TO FACE EVALUATION WAS PERFORMED  Analise Glotfelty T 03/09/2014 9:32 AM

## 2014-03-09 NOTE — Plan of Care (Signed)
Problem: RH BOWEL ELIMINATION Goal: RH STG MANAGE BOWEL WITH ASSISTANCE STG Manage Bowel with Assistance. Mod I  Outcome: Progressing     

## 2014-03-09 NOTE — Progress Notes (Signed)
Occupational Therapy Session Note  Patient Details  Name: [name] MRN: [id number] Date of Birth: [birth year]  Today's Date: 03/09/2014 OT Individual Time: 1400-1500 OT Individual Time Calculation (min): 60 min    Short Term Goals: Week 1:  OT Short Term Goal 1 (Week 1): Pt will complete bathing with min assist using AE PRN OT Short Term Goal 2 (Week 1): Pt will complete LB dressing with mod assist using AE PRN OT Short Term Goal 3 (Week 1): Pt will complete toilet transfers with min assist utilizing RW OT Short Term Goal 4 (Week 1): Pt will complete 2/4 grooming tasks in standing with min assist  Skilled Therapeutic Interventions/Progress Updates:  Engaged in therapeutic activity with focus on sit <> stand, standing tolerance without UE support, functional mobility, and education on AE. Pt in bed upon arrival, educated pt on AE to assist with LB bathing and dressing tasks.  Pt return demonstrated doffing and donning Lt sock with use of reacher and sock aid.  Pt ambulated to therapy gym ~130 feet with RW with min assist while therapist guided IV pole.  Pt with no c/o lightheadedness or dizziness during session.  In therapy gym, completed 3D pipe tree in standing with focus on sit <> stand and BUE use during standing activity.  Pt min assist sit > stand from therapy mat, progressively lowering mat with eat sit <> stand.  Pt tolerated standing while completing table top activity with occasional min/steady assist.  Pt returned to room with RW and min assist and left seated EOB.  Therapy Documentation Precautions:  Precautions Precautions: Fall, Back Precaution Booklet Issued: No Restrictions Weight Bearing Restrictions: No General:   Vital Signs: Therapy Vitals Temp: 98.2 F (36.8 C) Temp Source: Oral Pulse Rate: 74 Resp: 18 BP: 129/71 mmHg Patient Position (if appropriate): Lying Oxygen Therapy SpO2: 97 % O2 Device: Not Delivered Pain: Pain Assessment Pain Score: 4  Pain  Type: Acute pain Pain Location: Back Pain Orientation: Right;Left;Lower Pain Descriptors / Indicators: Aching Pain Onset: On-going Pain Intervention(s): Rest (premedicated)  See FIM for current functional status  Therapy/Group: Individual Therapy  [name] 03/09/2014, 4:18 PM

## 2014-03-09 NOTE — Evaluation (Signed)
Physical Therapy Assessment and Plan  Patient Details  Name: [name] MRN: [id number] Date of Birth: Apr 29, 1965  PT Diagnosis: Abnormal posture, Abnormality of gait, Low back pain and Muscle weakness Rehab Potential: Excellent ELOS: 10-14 days   Today's Date: 03/09/2014 PT Individual Time: 5366-4403 PT Individual Time Calculation (min): 65 min    Problem List:  Patient Active Problem List   Diagnosis Date Noted  . Discitis of lumbar region 03/08/2014  . Cerebral septic emboli   . Enterococcal bacteremia 03/07/2014  . Diskitis 03/07/2014  . Osteomyelitis of lumbar vertebra 03/07/2014  . Solitary kidney, acquired 03/07/2014  . Acute encephalopathy 03/07/2014  . Acute bacterial endocarditis   . Malnutrition of moderate degree 03/01/2014  . Cardiomyopathy- EF 30-35% 03/01/2014  . CVA (cerebral infarction)- embolic 47/42/5956  . Acute respiratory failure with hypoxia   . Gram-positive bacteremia   . Respiratory failure, acute   . Severe sepsis   . Acute respiratory failure   . Back pain 02/27/2014  . Intractable back pain 02/27/2014  . Hyperglycemia 02/27/2014  . DM (diabetes mellitus), type 2, uncontrolled 02/27/2014  . Hyperlipidemia 02/27/2014  . Family history of ischemic heart disease 04/05/2013  . HYPERTENSION, BENIGN 03/12/2008  . PALPITATIONS 03/12/2008    Past Medical History:  Past Medical History  Diagnosis Date  . Diabetes mellitus without complication     type 2  . Hyperlipidemia   . Allergic rhinitis   . lipoma   . H/O scoliosis   . Hypertension     Diagnostic exercise tolerance test assessment:04/30/2010 : comments normal -no evidence os ischemia by ST analysis  . Solitary kidney   . Wilm's tumor (nephroblastoma)    Past Surgical History:  Past Surgical History  Procedure Laterality Date  . Lipoma excision    . Total nephrectomy Left   . Thyroid surgery    . Knee surgery      Assessment & Plan Clinical Impression: [name] is a 48  y.o.right handed male with history of diabetes mellitus, hypertension and scoliosis. Independent prior to admission living with his wife and 53-year-old son. Admitted 02/27/2014 with intractable low back pain 4 days denying any recent trauma as well as fever of 105 rectally and rigors. Patient works for [employer] and travels internationally recently returning from [place]. MRI lumbar spine 02/27/2014 with no acute abnormalities noted degenerative disc changes L3-4. Sedimentation rate 56. Patient developed seizures with decompensation requiring intubation mechanical ventilation. MRI showed small areas of acute infarct bilaterally consistent with cerebral emboli. MRA of the head negative. Echocardiogram ejection fraction 35% with grade 1 diastolic dysfunction as well as large echo density measuring 1.4 x 2 cm attached to the posterior leaflet of the mitral valve.. Carotid Dopplers no ICA stenosis. Infectious disease consulted for workup of fever TEE completed suggested mitral valve endocarditis. Venous Dopplers lower extremities negative for DVT. Blood cultures gram-positive cocci in chains. Cardiothoracic surgery Dr. Servando Snare consulted no surgical intervention at this time. Patient currently maintained on Rocephin as well as ampicillin until 04/15/2014 to complete a 6 week course. 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 Again contacts were made to infectious disease with recommendations to continue plan of care antibiotic therapy. EEG consistent with nonspecific cerebral dysfunction encephalopathy. Patient was later extubated slow progressive gains. Patient transferred to CIR on 03/08/2014 .   Patient currently requires mod  A with mobility secondary to muscle weakness, decreased activity tolerance and decreased sitting balance, decreased standing  balance, decreased postural control, decreased balance strategies and difficulty maintaining precautions.  Prior to hospitalization, patient was independent  with mobility and lived with Spouse, Son (85 yo son) in a House home.  Home access is 2Stairs to enter.  Patient will benefit from skilled PT intervention to maximize safe functional mobility and minimize fall risk for planned discharge home with intermittent assist.  Anticipate patient will benefit from follow up OP at discharge.  PT - End of Session Activity Tolerance: Tolerates 30+ min activity with multiple rests Endurance Deficit: Yes Endurance Deficit Description: Pt requested that this PT push w/c back to room secondary to fatigue folllowing ambulation, stairs. PT Assessment Rehab Potential (ACUTE/IP ONLY): Excellent Barriers to Discharge: Decreased caregiver support;Other (comment) (Wife available to provide supervision until end of year.) PT Patient demonstrates impairments in the following area(s): Balance;Endurance;Motor;Safety;Pain PT Transfers Functional Problem(s): Bed Mobility;Bed to Chair;Car;Furniture PT Locomotion Functional Problem(s): Ambulation;Wheelchair Mobility;Stairs PT Plan PT Intensity: Minimum of 1-2 x/day ,45 to 90 minutes PT Frequency: 5 out of 7 days PT Duration Estimated Length of Stay: 10-14 days PT Treatment/Interventions: Ambulation/gait training;Balance/vestibular training;Discharge planning;DME/adaptive equipment instruction;Functional mobility training;Patient/family education;Pain management;Neuromuscular re-education;Splinting/orthotics;Therapeutic Exercise;Therapeutic Activities;Stair training;UE/LE Strength taining/ROM;Wheelchair propulsion/positioning PT Transfers Anticipated Outcome(s): Mod I PT Locomotion Anticipated Outcome(s): Supervision to Mod I PT Recommendation Recommendations for Other Services: Other (comment) (None at this time) Follow Up Recommendations: Outpatient PT Patient  destination: Home Equipment Recommended: To be determined;Rolling walker with 5" wheels  Skilled Therapeutic Intervention PT evaluation performed. See below for detailed findings. Treatment initiated. Session focused on gait training and stair negotiation. See below for detailed description of assist/cueing required with gait and stairs. Educated pt on findings, goals, and plan of care. Oriented pt to rehab unit, fall precautions. Retrieved Roho cushion for pressure relief, preservation of skin integrity. Explained rationale for pressure relief to pt; will continue to reinforce. Pt verbalized understanding of all education and was in full agreement with plan of care. Departed with pt seated in w/c with all needs within reach.  PT Evaluation Precautions/Restrictions Precautions Precautions: Fall;Back Precaution Booklet Issued: No Restrictions Weight Bearing Restrictions: No General   Vital SignsTherapy Vitals Temp: 98.1 F (36.7 C) Temp Source: Oral Pulse Rate: 86 Resp: 18 BP: 130/82 mmHg Patient Position (if appropriate): Lying Oxygen Therapy SpO2: 98 % O2 Device: Not Delivered Pain Pain Assessment Pain Assessment: 0-10 Pain Score: 6  Pain Type: Acute pain Pain Location: Back Pain Orientation: Lower;Right;Left Pain Descriptors / Indicators: Aching Pain Onset: On-going Pain Intervention(s): RN made aware Multiple Pain Sites: No Home Living/Prior Functioning Home Living Available Help at Discharge: Other (Comment) (wife avialable 24/7 initially. She is a Pharmacist, hospital and is able to be at home at least until end of year) Type of Home: House Home Access: Stairs to enter [employer] of Steps: 2 Entrance Stairs-Rails: None (Also has 5 steps to enter front door with rails) Home Layout: Two level;Able to live on main level with bedroom/bathroom Alternate Level Stairs-Number of Steps: flight Alternate Level Stairs-Rails: Right Additional Comments: Pt has high bed an issue  for his back. 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   Leisure: Hobbies-yes (Comment) Comments: Works out 3 days/week at gym and loves to walk dog, play basketball. Vision/Perception  Vision - Assessment Eye Alignment: Within Functional Limits Ocular Range of Motion: Within Functional Limits Alignment/Gaze Preference: Within Defined Limits Tracking/Visual Pursuits: Able to track stimulus in all quads without difficulty  Cognition Overall Cognitive Status: Within Functional Limits for tasks assessed Orientation Level: Oriented X4 Sensation Sensation Light Touch: Appears Intact Proprioception: Appears Intact Coordination Gross Motor Movements are Fluid and Coordinated: No Coordination and Movement Description: Fluidity of movement limited by pain in lower back. Heel Shin Test: Excursion limited by weakness, pain in bilat LE's. Motor  Motor Motor: Abnormal postural alignment and control Motor - Skilled Clinical Observations: Generalized weakness in trunk and LE's; transitional movements limited by pain in lower back.  Mobility Bed Mobility Bed Mobility: Rolling Right;Supine to Sit Rolling Right: 5: Supervision Rolling Right Details: Verbal cues for precautions/safety Rolling Right Details (indicate cue type and reason): cueing for logroll technique for adherence to spinal precautions Supine to Sit: HOB flat;4: Min assist Supine to Sit Details: Verbal cues for precautions/safety;Verbal cues for technique;Tactile cues for sequencing Supine to Sit Details (indicate cue type and reason):  Min A and tactile/verbal cueing for logroll technique for adherence to spinal precautions. Transfers Transfers: Yes Sit to Stand: From bed;From chair/3-in-1;4: Min assist;With armrests Sit to Stand Details: Verbal cues for precautions/safety;Verbal cues for technique Sit to Stand Details (indicate cue type and reason): Very UE dependent due to pain in lower back. Stand to Sit: To chair/3-in-1;4: Min assist;With armrests Stand to Sit Details (indicate cue type and reason): Verbal cues for precautions/safety;Verbal cues for technique Stand to Sit Details: Very UE dependent due to pain in lower back. Stand Pivot Transfers: 3: Mod assist;With armrests Stand Pivot Transfer Details: Verbal cues for precautions/safety;Verbal cues for technique Locomotion  Ambulation Ambulation: Yes Ambulation/Gait Assistance: 3: Mod assist;1: +2 Total assist (+2A for safety due to lightheadedness) Ambulation Distance (Feet): 45 Feet Assistive device: Other (Comment) (No device; pt managing IV pole) Ambulation/Gait Assistance Details: Pt ambulated x45' with RUE support at IV pole with mod A initially (progressed to min A with increased distance ambulated); +2A for w/c follow secondary to c/o lightheadedness earlier in session. Gait Gait: Yes Gait Pattern: Impaired Gait Pattern: Step-through pattern;Decreased stride length;Decreased hip/knee flexion - right;Decreased hip/knee flexion - left;Decreased trunk rotation Stairs / Additional Locomotion Stairs: Yes Stairs Assistance: 3: Mod assist;1: +2 Total assist;Other (comment) (+2A for safety and IV pole management) Stairs Assistance Details: Verbal cues for technique;Visual cues/gestures for sequencing Stair Management Technique: Two rails;Step to pattern Number of Stairs: 5 Wheelchair Mobility Wheelchair Mobility: Yes Wheelchair Assistance: 4: Advertising account executive Details: Designer, fashion/clothing for sequencing;Verbal cues for Animator: Both lower extermities Wheelchair Parts Management: Needs assistance Distance: 120  Trunk/Postural Assessment  Cervical Assessment Cervical Assessment: Within Functional Limits Thoracic Assessment Thoracic Assessment: Exceptions to [facility] Thoracic AROM Overall Thoracic AROM: Deficits;Due to precautions Lumbar Assessment Lumbar Assessment: Exceptions to [facility] Lumbar AROM Overall Lumbar AROM: Deficits;Due to pain;Due to precautions Postural Control Postural Control: Deficits on evaluation Postural Limitations: Minimal movement at pelvis (in seated, standing, and with transitional movements) secondary to pain, fear/avoidance of movement.  Balance Balance Balance Assessed: Yes Dynamic Sitting Balance Dynamic Sitting - Balance Support: Bilateral upper extremity supported;Feet supported;During functional activity Dynamic Sitting - Level of Assistance: 5: Stand by assistance Sitting balance - Comments: Pt very UE dependent, fearful of dynamic sitting  without use of bed rails. Static Standing Balance Static Standing - Balance Support: Bilateral upper extremity supported;During functional activity Static Standing - Level of Assistance: 5: Stand by assistance Dynamic Standing Balance Dynamic Standing - Balance Support: During functional activity;Bilateral upper extremity supported Dynamic Standing - Level of Assistance: 4: Min assist Extremity Assessment  RUE Assessment RUE Assessment: Within Functional Limits (strength grossly 5/5) LUE Assessment LUE Assessment: Within Functional Limits (strength grossly 5/5) RLE Assessment RLE Assessment: Exceptions to [facility] RLE Strength RLE Overall Strength: Deficits Right Hip Flexion: 3+/5 Right Knee Flexion: 4/5 Right Knee Extension: 3+/5 Right Ankle Dorsiflexion: 4/5 Right Ankle Plantar Flexion: 4/5 LLE Assessment LLE Assessment: Exceptions to WFL LLE Strength LLE Overall Strength: Deficits Left Hip Flexion: 3-/5 Left Knee Flexion:  4/5 Left Knee Extension: 4/5 Left Ankle Dorsiflexion: 4/5 Left Ankle Plantar Flexion: 4/5  FIM:  FIM - Bed/Chair Transfer Bed/Chair Transfer: 3: Bed > Chair or W/C: Mod A (lift or lower assist);3: Chair or W/C > Bed: Mod A (lift or lower assist);4: Supine > Sit: Min A (steadying Pt. > 75%/lift 1 leg) FIM - Locomotion: Wheelchair Distance: 120 Locomotion: Wheelchair: 2: Travels 50 - 149 ft with minimal assistance (Pt.>75%) FIM - Locomotion: Ambulation Locomotion: Ambulation Assistive Devices: Other (comment) (No AD; pt managing IV pole) Ambulation/Gait Assistance: 3: Mod assist;1: +2 Total assist (+2A for safety due to lightheadedness) Locomotion: Ambulation: 1: Two helpers FIM - Locomotion: Stairs Locomotion: Scientist, physiological: Insurance account manager - 2 Locomotion: Stairs: 1: Two helpers (+2A for safety, IV pole management)   Refer to Care Plan for Long Term Goals  Recommendations for other services: None  Discharge Criteria: Patient will be discharged from PT if patient refuses treatment 3 consecutive times without medical reason, if treatment goals not met, if there is a change in medical status, if patient makes no progress towards goals or if patient is discharged from hospital.  The above assessment, treatment plan, treatment alternatives and goals were discussed and mutually agreed upon: by patient  Stefano Gaul 03/09/2014, 6:05 PM

## 2014-03-10 ENCOUNTER — Inpatient Hospital Stay (HOSPITAL_COMMUNITY): Payer: BC Managed Care – PPO | Admitting: *Deleted

## 2014-03-10 ENCOUNTER — Inpatient Hospital Stay (HOSPITAL_COMMUNITY): Payer: BC Managed Care – PPO | Admitting: Physical Therapy

## 2014-03-10 DIAGNOSIS — I669 Occlusion and stenosis of unspecified cerebral artery: Secondary | ICD-10-CM

## 2014-03-10 DIAGNOSIS — M4646 Discitis, unspecified, lumbar region: Principal | ICD-10-CM

## 2014-03-10 DIAGNOSIS — I76 Septic arterial embolism: Secondary | ICD-10-CM

## 2014-03-10 LAB — GLUCOSE, CAPILLARY
GLUCOSE-CAPILLARY: 127 mg/dL — AB (ref 70–99)
GLUCOSE-CAPILLARY: 158 mg/dL — AB (ref 70–99)
GLUCOSE-CAPILLARY: 84 mg/dL (ref 70–99)
Glucose-Capillary: 183 mg/dL — ABNORMAL HIGH (ref 70–99)

## 2014-03-10 NOTE — Progress Notes (Signed)
[facility] PHYSICAL MEDICINE & REHABILITATION     PROGRESS NOTE    Subjective/Complaints: Slept well, did "better than expected in PT/OT" Back pain is better  Review of Systems - Negative except pain in back with movement  Objective: Vital Signs: Blood pressure 121/67, pulse 70, temperature 97.8 F (36.6 C), temperature source Oral, resp. rate 20, height 6\' 6"  (1.981 m), weight 84.505 kg (186 lb 4.8 oz), SpO2 98 %. No results found.  Recent Labs  03/09/14 0530  WBC 6.8  HGB 12.3*  HCT 37.0*  PLT 145*    Recent Labs  03/09/14 0530  NA 132*  K 4.2  CL 94*  GLUCOSE 123*  BUN 14  CREATININE 0.86  CALCIUM 9.1   CBG (last 3)   Recent Labs  03/09/14 1611 03/09/14 2102 03/10/14 0637  GLUCAP 128* 125* 127*    Wt Readings from Last 3 Encounters:  03/08/14 84.505 kg (186 lb 4.8 oz)  03/02/14 93.3 kg (205 lb 11 oz)  03/01/14 86.183 kg (190 lb)    Physical Exam:  Constitutional: Patient is cooperative with exam. He is oriented to person, place, and time. He appears well-developed. Appears to be more comfortable today. HENT: Tongue is midline/face is symmetrical Head: Normocephalic.  Eyes: EOM are normal.  Neck: Normal range of motion. Neck supple. No thyromegaly present.  Cardiovascular: Normal rate and regular rhythm.  Respiratory: Effort normal and breath sounds normal. No respiratory distress.  GI: Soft. Bowel sounds are normal. He exhibits no distension.  Neurological: He is alert and oriented to person, place, and time. CN exam intact. Appears to have normal insight and awareness. Memory functional. UES: grossly 5/5 proximal to distal. LES: proximally inhibited by pain, 3- to 3/5 HF, 3+ KE, 5 ADF/APF. No sensory deficits. DTR's 1+ M/S: no pain with proximal leg movement and with trunk movement.  Skin: Skin is warm and dry/ no breakdown, healing incisions in BUE from multiple lipoma removal Psych: mood pleasant and appropriate   Assessment/Plan: 1.  Functional deficits secondary to septic bicerebral emboli, lumbar diskitis which require 3+ hours per day of interdisciplinary therapy in a comprehensive inpatient rehab setting. Physiatrist is providing close team supervision and 24 hour management of active medical problems listed below. Physiatrist and rehab team continue to assess barriers to discharge/monitor patient progress toward functional and medical goals. FIM: FIM - Bathing Bathing Steps Patient Completed: Chest, Right Arm, Left Arm, Abdomen, Front perineal area, Right upper leg, Left upper leg Bathing: 3: Mod-Patient completes 5-7 16f 10 parts or 50-74%  FIM - Upper Body Dressing/Undressing Upper body dressing/undressing steps patient completed: Thread/unthread right sleeve of pullover shirt/dresss, Thread/unthread left sleeve of pullover shirt/dress, Put head through opening of pull over shirt/dress, Pull shirt over trunk Upper body dressing/undressing: 5: Set-up assist to: Obtain clothing/put away FIM - Lower Body Dressing/Undressing Lower body dressing/undressing steps patient completed: Pull underwear up/down, Pull pants up/down, Thread/unthread right underwear leg Lower body dressing/undressing: 2: Max-Patient completed 25-49% of tasks     FIM - Radio producer Devices: Bedside commode Toilet Transfers: 3-To toilet/BSC: Mod A (lift or lower assist), 3-From toilet/BSC: Mod A (lift or lower assist)  FIM - Control and instrumentation engineer Devices: Bed rails, HOB elevated, Arm rests Bed/Chair Transfer: 4: Supine > Sit: Min A (steadying Pt. > 75%/lift 1 leg), 4: Sit > Supine: Min A (steadying pt. > 75%/lift 1 leg), 3: Bed > Chair or W/C: Mod A (lift or lower assist), 3: Chair or W/C >  Bed: Mod A (lift or lower assist)  FIM - Locomotion: Wheelchair Distance: 120 Locomotion: Wheelchair: 2: Travels 50 - 149 ft with minimal assistance (Pt.>75%) FIM - Locomotion: Ambulation Locomotion:  Ambulation Assistive Devices: Administrator Ambulation/Gait Assistance: 4: Min assist Locomotion: Ambulation: 2: Travels 50 - 149 ft with minimal assistance (Pt.>75%)  Comprehension Comprehension Mode: Auditory Comprehension: 6-Follows complex conversation/direction: With extra time/assistive device  Expression Expression Mode: Verbal Expression: 6-Expresses complex ideas: With extra time/assistive device  Social Interaction Social Interaction: 6-Interacts appropriately with others with medication or extra time (anti-anxiety, antidepressant).  Problem Solving Problem Solving: 7-Solves complex problems: Recognizes & self-corrects  Memory Memory: 7-Complete Independence: No helper Medical Problem List and Plan: 1. 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 DVT Prophylaxis/Anticoagulation: SCDs. Venous Doppler studies 03/06/2014 negative 3. Pain Management: Oxycodone and Robaxin as needed -encouraged him to take oxycodone prior to activity with therapy 4. ID/enterococcal bacteremia. Intravenous ampicillin and Rocephin until 04/15/2014 completing a six-week course.  -plan is for conservative care regarding his back as well unless symptoms and pathology progress. 5. Neuropsych: This patient is capable of making decisions on his own behalf. 6. Skin/Wound Care: Routine skin checks 7. Fluids/Electrolytes/Nutrition: Strict I&O's. Follow-up chemistries normal. Appetite reasonable. 8. Diabetes mellitus. Hemoglobin A1c 10.3. NovoLog 6 units 3 times a day, Lantus insulin 25 units daily at bedtime.   -fair control at present 9. Hypertension. Lisinopril 2.5 mg daily, Lopressor 25 mg twice a day.    -control pain 10. Hyperlipidemia. Lipitor  LOS (Days) 2 A FACE TO FACE EVALUATION WAS PERFORMED  Charlett Blake 03/10/2014 7:32 AM

## 2014-03-10 NOTE — IPOC Note (Signed)
Overall Plan of Care [facility]) Patient Details Name: [name] MRN: [id number] DOB: [birth year]  Admitting Diagnosis: CVA  Hospital Problems: Active Problems:   Discitis of lumbar region   Cerebral septic emboli     Functional Problem List: Nursing Bowel, Motor, Pain  PT Balance, Endurance, Motor, Safety, Pain  OT Balance, Endurance, Motor, Safety  SLP    TR         Basic ADL's: OT Grooming, Bathing, Dressing, Toileting     Advanced  ADL's: OT       Transfers: PT Bed Mobility, Bed to Chair, Car, Manufacturing systems engineer, Metallurgist: PT Ambulation, Emergency planning/management officer, Stairs     Additional Impairments: OT None  SLP        TR      Anticipated Outcomes Item Anticipated Outcome  Self Feeding independent  Swallowing      Basic self-care  mod I  Toileting  mod I   Bathroom Transfers mod I  Bowel/Bladder  Continent of bowel and bladder  Transfers  Mod I  Locomotion  Supervision to [employer] I  Communication     Cognition     Pain  </=4  Safety/Judgment  No falls with injury   Therapy Plan: PT Intensity: Minimum of 1-2 x/day ,45 to 90 minutes PT Frequency: 5 out of 7 days PT Duration Estimated Length of Stay: 10-14 days OT Intensity: Minimum of 1-2 x/day, 45 to 90 minutes OT Frequency: 5 out of 7 days OT Duration/Estimated Length of Stay: 10-14 days         Team Interventions: Nursing Interventions Patient/Family Education, Bowel Management, Pain Management, Medication Management  PT interventions Ambulation/gait training, Balance/vestibular training, Discharge planning, DME/adaptive equipment instruction, Functional mobility training, Patient/family education, Pain management, Neuromuscular re-education, Splinting/orthotics, Therapeutic Exercise, Therapeutic Activities, Stair training, UE/LE Strength taining/ROM, Wheelchair propulsion/positioning  OT Interventions Training and development officer, Academic librarian, Discharge planning,  Disease mangement/prevention, Engineer, drilling, Functional mobility training, Pain management, Patient/family education, Psychosocial support, Self Care/advanced ADL retraining, Therapeutic Activities, Therapeutic Exercise  SLP Interventions    TR Interventions    SW/CM Interventions Discharge Planning, Psychosocial Support, Patient/Family Education    Team Discharge Planning: Destination: PT-Home ,OT- Home , SLP-  Projected Follow-up: PT-Outpatient PT, OT-  Other (comment) (TBD), SLP-  Projected Equipment Needs: PT-To be determined, Rolling walker with 5" wheels, OT- Tub/shower seat, SLP-  Equipment Details: PT- , OT-  Patient/family involved in discharge planning: PT- Patient,  OT-Patient, Family member/caregiver, SLP-   MD ELOS: 10-12 days Medical Rehab Prognosis:  Excellent Assessment: The patient has been admitted for CIR therapies with the diagnosis of septic emboli to brain, lumbar diskitis. The team will be addressing functional mobility, strength, stamina, balance, safety, adaptive techniques and equipment, self-care, bowel and bladder mgt, patient and caregiver education, pain mgt, back precautions, communication, cognition, coping skills, community reintegration. Goals have been set at [facility] for mobility, transfers, ADL's, and self-care    Meredith Staggers, MD, [facility]      See Team Conference Notes for weekly updates to the plan of care

## 2014-03-10 NOTE — Progress Notes (Signed)
Physical Therapy Session Note  Patient Details  Name: [name] MRN: [id number] Date of Birth: [birth year]  Today's Date: 03/10/2014 PT Individual Time: 1430-1500 PT Individual Time Calculation (min): 30 min   Short Term Goals: Week 1:  PT Short Term Goal 1 (Week 1): Pt will perform supine<>sit with HOB flat, no rails, and supervision using logroll technique. PT Short Term Goal 2 (Week 1): Pt will transfer from bed<>w/c using LRAD with min A and 25% cueing. PT Short Term Goal 3 (Week 1): Pt will perform gait x150' in controlled environment with supervision using LRAD. PT Short Term Goal 4 (Week 1): Pt will negotiate 2 steps without rails with LRAD requiring min A and 25% cueing for technique.  Skilled Therapeutic Interventions/Progress Updates:   Tx focused on gait with RW and activity tolerance/strengthening. Pt continues to c/o significant pain, but better than this morning. No orthostatic changes this afternoon. Ted hose seem to be working. Pt declining most sitting tasks this afternoon due to pain, preferring to rest in standing between tasks as long as fatigue level not limiting.   Performed supine>sit from elevated HOB with min-guard A at trunk. Pt performed all sit<>stand transfers with Min A this afternoon with hand placement safety cues.   Gait training in hall [phone]' and 1x175' with RW and min-guard A with good safety awareness. Pt given cues to decrease UE support on RW as able and safe.   Pt engaged in horseshoe toss for unsupported standing tolerance and dynamic balance challenge. Pt not challenged to reach outside BOS due to back precautions.   Stair training x10 with bil UE rails with min A and no safety issues. Pt will need to perform 3 stairs without rail for home entry.       Therapy Documentation Precautions:  Precautions Precautions: Fall, Back Precaution Booklet Issued: No Restrictions Weight Bearing Restrictions: No General:   Vital Signs: Therapy  Vitals Temp: 98.5 F (36.9 C) Temp Source: Oral Pulse Rate: 78 Resp: 20 BP: 110/60 mmHg Patient Position (if appropriate): Lying Oxygen Therapy SpO2: 97 % O2 Device: Not Delivered Pain: 5/10 back pain, radiating. Modified tx and nursing brought meds      Locomotion : Ambulation Ambulation/Gait Assistance: 4: Min guard   See FIM for current functional status  Therapy/Group: Individual Therapy  Kennieth Rad, PT, DPT  03/10/2014, 3:44 PM

## 2014-03-10 NOTE — Plan of Care (Signed)
Problem: RH BOWEL ELIMINATION Goal: RH STG MANAGE BOWEL WITH ASSISTANCE STG Manage Bowel with Assistance. Mod I Outcome: Progressing No incontinent episode reported     

## 2014-03-10 NOTE — Progress Notes (Signed)
Occupational Therapy Session Note  Patient Details  Name: [name] MRN: [id number] Date of Birth: 1965/07/15  Today's Date: 03/10/2014 OT Individual Time:  -   1115-1205  (50 min)   1st session::       Short Term Goals: Week 1:  OT Short Term Goal 1 (Week 1): Pt will complete bathing with min assist using AE PRN OT Short Term Goal 2 (Week 1): Pt will complete LB dressing with mod assist using AE PRN OT Short Term Goal 3 (Week 1): Pt will complete toilet transfers with min assist utilizing RW OT Short Term Goal 4 (Week 1): Pt will complete 2/4 grooming tasks in standing with min assist Week 2:     Skilled Therapeutic Interventions/Progress Updates:      1st session:;   Pt. Sitting in recliner upon OT arrival. Pt. Agreed to get bathed and dressed at recliner level.  Performed sit to stand with minimal assist x4 during session.  He reported he had a rough morning with IV and back pain.  Used AE for LB dressing with wife assisting.  Pt recalled 2/3 back precautions. Educated him on the back precautions.   Transferred back to bed with min assist.  Pt. Left with call bell and phone in reach.   .    2nd session:   Time:  2119-4174  (42  Min) Pain: 6/10  back Individual session Engaged in therapeutic activity with focus on sit <> stand, standing tolerance without UE support, functional mobility,  Pt ambulating to ADL apt.  Addressed transfers to high back chair, sofa, shower stall and then back to room.  Pt.was min assist to SBA with all transfers.  He utilized step in backwards technique in the shower stall.  He does have a large shower but walker will not fil through the door. Putting up grab bars is not an option.  Pt. Ambulated back to room  with RW and min assist.  Practiced squats at sink with walker, with hands on sink and then using no hand support.  Pt was able to do 3 sets of 10 on each area.  He reported his left knee was weak from an ACL injury.  Pt. Ambulated back to bed and left  lying in bed with all needs in reach.    Therapy Documentation Precautions:  Precautions Precautions: Fall, Back Precaution Booklet Issued: No Restrictions Weight Bearing Restrictions: No  Pain: Pain Assessment Pain Assessment: 0-10 Pain Score: 9  1st session Pain Type: Acute pain Pain Location: Back Pain Orientation: Lower Pain Descriptors / Indicators: Aching Pain Frequency: Constant Pain Onset: On-going Pain Intervention(s): Medication (See eMAR)         See FIM for current functional status  Therapy/Group: Individual Therapy  Lisa Roca 03/10/2014, 8:25 AM

## 2014-03-10 NOTE — Progress Notes (Signed)
Physical Therapy Session Note  Patient Details  Name: [name] MRN: [id number] Date of Birth: April 28, 1965  Today's Date: 03/10/2014 PT Individual Time: 0930-1030 PT Individual Time Calculation (min): 60 min   Short Term Goals: Week 1:  PT Short Term Goal 1 (Week 1): Pt will perform supine<>sit with HOB flat, no rails, and supervision using logroll technique. PT Short Term Goal 2 (Week 1): Pt will transfer from bed<>w/c using LRAD with min A and 25% cueing. PT Short Term Goal 3 (Week 1): Pt will perform gait x150' in controlled environment with supervision using LRAD. PT Short Term Goal 4 (Week 1): Pt will negotiate 2 steps without rails with LRAD requiring min A and 25% cueing for technique.  Skilled Therapeutic Interventions/Progress Updates:   Pt received semi reclined in bed; agreeable to therapy. Session focused on increasing activity tolerance, improving stability/safety with transitional movements. Pt able to verbalize all spinal precautions without cueing. Pt performed supine>sit with HOB flat, no rails, and supervision, increased time, and min verbal cueing for adherence to spinal precautions. Upon sitting EOB, pt reporting increased lightheadedness. Donned thigh high Teds to address suspected orthostatic hypotension. Pt performed gait x150' in controlled environment with min guard while managing IV pole. In treatment gym, pt performed blocked practice of graded sit<>stand transfers (initially from progressively lower mat table height).  Pt with onset of diaphoresis, lightheadedness during static standing. Seated vitals: BP 135/75, HR 82. Standing: BP 93/56, HR 99 with symptoms as described. Notified RN. Educated pt on strategies for increasing BP; pt verbalized understanding. After symptoms subsided, pt performed gait x150' in controlled environment with rolling walker and close supervision to min guard. Session ended in pt room, where pt was left seated in recliner with wife present  and all needs within reach.   Therapy Documentation Precautions:  Precautions Precautions: Fall, Back Precaution Booklet Issued: No Restrictions Weight Bearing Restrictions: No Vital Signs: Therapy Vitals Temp: 98.5 F (36.9 C) Temp Source: Oral Pulse Rate: 78 Resp: 20 BP: 110/60 mmHg Patient Position (if appropriate): Lying Oxygen Therapy SpO2: 97 % O2 Device: Not Delivered Pain: Pain Assessment Pain Score: 6  Locomotion : Ambulation Ambulation/Gait Assistance: 4: Min guard;5: Supervision   See FIM for current functional status  Therapy/Group: Individual Therapy  Anayla Giannetti, Malva Cogan 03/10/2014, 4:59 PM

## 2014-03-10 NOTE — Plan of Care (Signed)
Problem: RH PAIN MANAGEMENT Goal: RH STG PAIN MANAGED AT OR BELOW PT'S PAIN GOAL <7  Outcome: Not Progressing Reports pain as 8.

## 2014-03-11 ENCOUNTER — Inpatient Hospital Stay (HOSPITAL_COMMUNITY): Payer: BC Managed Care – PPO | Admitting: Physical Therapy

## 2014-03-11 ENCOUNTER — Inpatient Hospital Stay (HOSPITAL_COMMUNITY): Payer: BC Managed Care – PPO | Admitting: *Deleted

## 2014-03-11 LAB — GLUCOSE, CAPILLARY
Glucose-Capillary: 138 mg/dL — ABNORMAL HIGH (ref 70–99)
Glucose-Capillary: 151 mg/dL — ABNORMAL HIGH (ref 70–99)
Glucose-Capillary: 80 mg/dL (ref 70–99)
Glucose-Capillary: 265 mg/dL — ABNORMAL HIGH (ref 70–99)

## 2014-03-11 NOTE — Progress Notes (Signed)
Physical Therapy Session Note  Patient Details  Name: Krishiv Sandler MRN: [id number] Date of Birth: 1965/12/17  Today's Date: 03/11/2014 PT Individual Time: 0800-0900 and 3570-1779 PT Individual Time Calculation (min): 60 min and 33 min  Short Term Goals: Week 1:  PT Short Term Goal 1 (Week 1): Pt will perform supine<>sit with HOB flat, no rails, and supervision using logroll technique. PT Short Term Goal 2 (Week 1): Pt will transfer from bed<>w/c using LRAD with min A and 25% cueing. PT Short Term Goal 3 (Week 1): Pt will perform gait x150' in controlled environment with supervision using LRAD. PT Short Term Goal 4 (Week 1): Pt will negotiate 2 steps without rails with LRAD requiring min A and 25% cueing for technique.  Skilled Therapeutic Interventions/Progress Updates:    Treatment Session 1: Pt received semi reclined in bed; agreeable to therapy. Session focused on car transfers, activity tolerance, and stair engotiation. Pt ambulated to/from toilet with rolling walker and performed toilet transfer with grab bars and min A. Pt required cueing for adherence to spinal precautions with hygiene. Transitioned to gait x250' then x150' in controlled environment with rolling walker and supervision, increased time. Simulated car transfer with rolling walker and supervision/cueing. Explained, demonstrated negotiation of 3 stairs without rails (to simulate home environment) ascending forward-facing and descending backwards (to prevent thoracic/lumbar spine flexion for adherence to spinal precautions) with min guard, manual stabilization of rolling walker. Demonstrated negotiation of single curb step (to simulate home door threshold) forward-facing as well as backwards; pt gave effective return demonstration of both techniques. Session ended in pt room, where pt was left seated in standard chair with all needs within reach.  Treatment Session 2: Pt received semi reclined in bed; agreeable to therapy but  reporting fatigue and generalized "soreness" in calves since AM session. Educated pt on self-stretch of gastrocnemius/soleus muscles using sheet. Pt gave effective return demonstration of stretch 2 x60-second holds per side. Per pt, calf soreness mitigated with stretch. Explained role of core stabilization in mitigating back pain and increasing activity tolerance. Explained role of transverse abdominus (TA) muscle in spinal stabilization. With verbal/tactile cueing, pt able to activate TA muscle in semi reclined. Progressed to TA activation with concurrent LE movement. Pt will require reinforcement. Pt performed supine<>sit with HOB flat with bed rail and effective logroll technique. Pt able to verbalize all spinal precautions. Departed with pt semi reclined in bed with bed alarm on and all needs within reach.  Therapy Documentation Precautions:  Precautions Precautions: Fall, Back Precaution Booklet Issued: No Restrictions Weight Bearing Restrictions: No Vital Signs: Therapy Vitals Temp: 98.1 F (36.7 C) Temp Source: Oral Pulse Rate: 71 Resp: 18 BP: 120/62 mmHg Patient Position (if appropriate): Lying Oxygen Therapy SpO2: 97 % O2 Device: Not Delivered Pain: Pain Assessment Pain Assessment: 0-10 Pain Score: 6  Pain Type: Acute pain Pain Location: Back Pain Orientation: Lower Pain Descriptors / Indicators: Aching Pain Onset: On-going Pain Intervention(s): Ambulation/increased activity Locomotion : Ambulation Ambulation/Gait Assistance: 5: Supervision   See FIM for current functional status  Therapy/Group: Individual Therapy  Dedrick Heffner, Malva Cogan 03/11/2014, 9:01 AM

## 2014-03-11 NOTE — Progress Notes (Signed)
Occupational Therapy Session Note  Patient Details  Name: [name] MRN: [id number] Date of Birth: 13-Aug-1965  Today's Date: 03/11/2014 OT Individual Time: 1100-1200 OT Individual Time Calculation (min): 60 min   1st session  Short Term Goals: Week 1:  OT Short Term Goal 1 (Week 1): Pt will complete bathing with min assist using AE PRN OT Short Term Goal 2 (Week 1): Pt will complete LB dressing with mod assist using AE PRN OT Short Term Goal 3 (Week 1): Pt will complete toilet transfers with min assist utilizing RW OT Short Term Goal 4 (Week 1): Pt will complete 2/4 grooming tasks in standing with min assist      Skilled Therapeutic Interventions/Progress Updates:     1st session:  Addressed functional mobility, transfers, strength, and endurance.  Pt. Sitting in chair upon arrival.  Ambulated with minimal to SBA to toilet with IV pole.  Pt. Stood to urinate with SBA.  Ambulated to gym with RW with SBA.  Marland Kitchen Performed strengthening and endurance activities using nustep.    Pt did 10 min at 5 workload using only LE.  Performed stretching in standing and ambulation using tai chi techniques.   Did 3 minutes more on nustep.  Ambulated back to room with RW  Left in chair with all needs in reach.     2nd session:  Time:  1400-1430   (30 min) Pain:   6/10 back Individual session.  Addressed functional mobility, sit to stand, transfers.  Pt. Went from supine to sit with SBA plus bed rails.  Pt. Recalled 2/3 back precautions.  Reviewed them again.  Ambulated with RW to toilet.  Stood with RW to urinate.  Pt. Ambulated to tub transfer bench with SBA and transferred to shower.  Pt bathed self with sit to stand SBA.  Ambulated out to bed area and completed dressing with AE in standing and sitting.  Pt. Left EOB with all needs in reach.    Therapy Documentation Precautions:  Precautions Precautions: Fall, Back Precaution Booklet Issued: No Restrictions Weight Bearing Restrictions: No        Pain: Pain Assessment Pain Assessment: 0-10 Pain Score: 6/10  1st session  Faces Pain Scale: Hurts little more Pain Type: Acute pain Pain Location: Back Pain Orientation: Lower Pain Descriptors / Indicators: Aching Pain Onset: On-going Pain Intervention(s): Medication (See eMAR)           See FIM for current functional status  Therapy/Group: Individual Therapy  Lisa Roca 03/11/2014, 12:41 PM

## 2014-03-11 NOTE — Progress Notes (Signed)
[facility]IT[facility]     PROGRESS NOTE    Subjective/Complaints:  Back pain is better, d/w PT, some orthostatic hypotension when first up in am  Review of Systems - Negative except pain in back with movement  Objective: Vital Signs: Blood pressure 120/62, pulse 71, temperature 98.1 F (36.7 C), temperature source Oral, resp. rate 18, height 6\' 6"  (1.981 m), weight 84.505 kg (186 lb 4.8 oz), SpO2 97 %. No results found.  Recent Labs  03/09/14 0530  WBC 6.8  HGB 12.3*  HCT 37.0*  PLT 145*    Recent Labs  03/09/14 0530  NA 132*  K 4.2  CL 94*  GLUCOSE 123*  BUN 14  CREATININE 0.86  CALCIUM 9.1   CBG (last 3)   Recent Labs  03/10/14 1626 03/10/14 2036 03/11/14 0653  GLUCAP 84 183* 138*    Wt Readings from Last 3 Encounters:  03/08/14 84.505 kg (186 lb 4.8 oz)  03/02/14 93.3 kg (205 lb 11 oz)  03/01/14 86.183 kg (190 lb)    Physical Exam:  Constitutional: Patient is cooperative with exam. He is oriented to person, place, and time. He appears well-developed. Appears to be more comfortable today. HENT: Tongue is midline/face is symmetrical Head: Normocephalic.  Eyes: EOM are normal.  Neck: Normal range of motion. Neck supple. No thyromegaly present.  Cardiovascular: Normal rate and regular rhythm.  Respiratory: Effort normal and breath sounds normal. No respiratory distress.  GI: Soft. Bowel sounds are normal. He exhibits no distension.  Neurological: He is alert and oriented to person, place, and time. CN exam intact. Appears to have normal insight and awareness. Memory functional. UES: grossly 5/5 proximal to distal. LES: proximally inhibited by pain, 3- to 3/5 HF, 3+ KE, 5 ADF/APF. No sensory deficits. DTR's 1+ M/S: no pain with proximal leg movement and with trunk movement.  Skin: Skin is warm and dry/ no breakdown, healing incisions in BUE from multiple lipoma removal Psych: mood pleasant and  appropriate   Assessment/Plan: 1. Functional deficits secondary to septic bicerebral emboli, lumbar diskitis which require 3+ hours per day of interdisciplinary therapy in a comprehensive inpatient rehab setting. Physiatrist is providing close team supervision and 24 hour management of active medical problems listed below. Physiatrist and rehab team continue to assess barriers to discharge/monitor patient progress toward functional and medical goals. FIM: FIM - Bathing Bathing Steps Patient Completed: Chest, Right Arm, Left Arm, Abdomen, Front perineal area, Right upper leg, Left upper leg, Buttocks Bathing: 3: Mod-Patient completes 5-7 94f 10 parts or 50-74%  FIM - Upper Body Dressing/Undressing Upper body dressing/undressing steps patient completed: Thread/unthread right sleeve of pullover shirt/dresss, Thread/unthread left sleeve of pullover shirt/dress, Put head through opening of pull over shirt/dress, Pull shirt over trunk Upper body dressing/undressing: 5: Set-up assist to: Obtain clothing/put away FIM - Lower Body Dressing/Undressing Lower body dressing/undressing steps patient completed: Pull underwear up/down, Pull pants up/down, Thread/unthread right underwear leg, Thread/unthread left underwear leg Lower body dressing/undressing: 3: Mod-Patient completed 50-74% of tasks     FIM - Radio producer Devices: Recruitment consultant Transfers: 0-Activity did not occur  FIM - Control and instrumentation engineer Devices: Arm rests, Copy: 5: Supine > Sit: Supervision (verbal cues/safety issues), 4: Bed > Chair or W/C: Min A (steadying Pt. > 75%), 4: Chair or W/C > Bed: Min A (steadying Pt. > 75%)  FIM - Locomotion: Wheelchair Distance: 120 Locomotion: Wheelchair: 0: Activity did not occur FIM -  Locomotion: Ambulation Locomotion: Ambulation Assistive Devices: Administrator Ambulation/Gait Assistance: 4: Min guard, 5:  Supervision Locomotion: Ambulation: 4: Travels 150 ft or more with minimal assistance (Pt.>75%)  Comprehension Comprehension Mode: Auditory Comprehension: 6-Follows complex conversation/direction: With extra time/assistive device  Expression Expression Mode: Verbal Expression: 6-Expresses complex ideas: With extra time/assistive device  Social Interaction Social Interaction: 6-Interacts appropriately with others with medication or extra time (anti-anxiety, antidepressant).  Problem Solving Problem Solving: 7-Solves complex problems: Recognizes & self-corrects  Memory Memory: 7-Complete Independence: No helper Medical Problem List and Plan: 1. 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 DVT Prophylaxis/Anticoagulation: SCDs. Venous Doppler studies 03/06/2014 negative 3. Pain Management: Oxycodone and Robaxin as needed -encouraged him to take oxycodone prior to activity with therapy 4. ID/enterococcal bacteremia. Intravenous ampicillin and Rocephin until 04/15/2014 completing a six-week course.  -plan is for conservative care regarding his back as well unless symptoms and pathology progress. 5. Neuropsych: This patient is capable of making decisions on his own behalf. 6. Skin/Wound Care: Routine skin checks 7. Fluids/Electrolytes/Nutrition: Strict I&O's. Follow-up chemistries normal. Appetite reasonable. 8. Diabetes mellitus. Hemoglobin A1c 10.3. NovoLog 6 units 3 times a day, Lantus insulin 25 units daily at bedtime.   -fair control at present 9. Hypertension. Lisinopril 2.5 mg daily, Lopressor 25 mg twice a day.    -control pain 10. Hyperlipidemia. Lipitor  LOS (Days) 3 A FACE TO FACE EVALUATION WAS PERFORMED  Denim Start E 03/11/2014 8:01 AM

## 2014-03-12 ENCOUNTER — Inpatient Hospital Stay (HOSPITAL_COMMUNITY): Payer: BC Managed Care – PPO

## 2014-03-12 DIAGNOSIS — E119 Type 2 diabetes mellitus without complications: Secondary | ICD-10-CM

## 2014-03-12 LAB — GLUCOSE, CAPILLARY
GLUCOSE-CAPILLARY: 131 mg/dL — AB (ref 70–99)
Glucose-Capillary: 127 mg/dL — ABNORMAL HIGH (ref 70–99)
Glucose-Capillary: 145 mg/dL — ABNORMAL HIGH (ref 70–99)
Glucose-Capillary: 149 mg/dL — ABNORMAL HIGH (ref 70–99)

## 2014-03-12 NOTE — Progress Notes (Signed)
[place] PHYSICAL MEDICINE & REHABILITATION     PROGRESS NOTE    Subjective/Complaints:  Back pain is better, d/w PT, some orthostatic hypotension when first up in am  Review of Systems - Negative except pain in back with movement  Objective: Vital Signs: Blood pressure 128/70, pulse 62, temperature 98.1 F (36.7 C), temperature source Oral, resp. rate 18, height 6\' 6"  (1.981 m), weight 84.505 kg (186 lb 4.8 oz), SpO2 99 %. No results found. No results for input(s): WBC, HGB, HCT, PLT in the last 72 hours. No results for input(s): NA, K, CL, GLUCOSE, BUN, CREATININE, CALCIUM in the last 72 hours.  Invalid input(s): CO CBG (last 3)   Recent Labs  03/11/14 1627 03/11/14 2108 03/12/14 0711  GLUCAP 80 265* 131*    Wt Readings from Last 3 Encounters:  03/08/14 84.505 kg (186 lb 4.8 oz)  03/02/14 93.3 kg (205 lb 11 oz)  03/01/14 86.183 kg (190 lb)    Physical Exam:  Constitutional: Patient is cooperative with exam. He is oriented to person, place, and time. He appears well-developed. Appears to be more comfortable today. HENT: Tongue is midline/face is symmetrical Head: Normocephalic.  Eyes: EOM are normal.  Neck: Normal range of motion. Neck supple. No thyromegaly present.  Cardiovascular: Normal rate and regular rhythm.  Respiratory: Effort normal and breath sounds normal. No respiratory distress.  GI: Soft. Bowel sounds are normal. He exhibits no distension.  Neurological: He is alert and oriented to person, place, and time. CN exam intact. Appears to have normal insight and awareness. Memory functional. UES: grossly 5/5 proximal to distal. LES: proximally inhibited by pain, 4/5 HF,  KE, 5/5 ADF/APF. No sensory deficits. DTR's 1+ M/S: no pain with proximal leg movement and with trunk movement.  Skin: Skin is warm and dry/ no breakdown, healing incisions in BUE from multiple lipoma removal Psych: mood pleasant and appropriate   Assessment/Plan: 1. Functional  deficits secondary to septic bicerebral emboli, lumbar diskitis which require 3+ hours per day of interdisciplinary therapy in a comprehensive inpatient rehab setting. Physiatrist is providing close team supervision and 24 hour management of active medical problems listed below. Physiatrist and rehab team continue to assess barriers to discharge/monitor patient progress toward functional and medical goals. FIM: FIM - Bathing Bathing Steps Patient Completed: Chest, Right Arm, Left Arm, Abdomen, Front perineal area, Right upper leg, Left upper leg, Buttocks Bathing: 3: Mod-Patient completes 5-7 53f 10 parts or 50-74%  FIM - Upper Body Dressing/Undressing Upper body dressing/undressing steps patient completed: Thread/unthread right sleeve of pullover shirt/dresss, Thread/unthread left sleeve of pullover shirt/dress, Put head through opening of pull over shirt/dress, Pull shirt over trunk Upper body dressing/undressing: 5: Set-up assist to: Obtain clothing/put away FIM - Lower Body Dressing/Undressing Lower body dressing/undressing steps patient completed: Pull underwear up/down, Pull pants up/down, Thread/unthread right underwear leg, Thread/unthread left underwear leg Lower body dressing/undressing: 3: Mod-Patient completed 50-74% of tasks  FIM - Toileting Toileting steps completed by patient: Adjust clothing prior to toileting, Performs perineal hygiene, Adjust clothing after toileting Toileting Assistive Devices: Grab bar or rail for support Toileting: 4: Steadying assist  FIM - Radio producer Devices: Grab bars Toilet Transfers: 4-To toilet/BSC: Min A (steadying Pt. > 75%), 4-From toilet/BSC: Min A (steadying Pt. > 75%)  FIM - Bed/Chair Transfer Bed/Chair Transfer Assistive Devices: Arm rests, Walker, HOB elevated, Bed rails (HOB elevated 30 degrees) Bed/Chair Transfer: 5: Supine > Sit: Supervision (verbal cues/safety issues), 4: Bed > Chair or W/C:  Min A  (steadying Pt. > 75%), 4: Chair or W/C > Bed: Min A (steadying Pt. > 75%)  FIM - Locomotion: Wheelchair Distance: 120 Locomotion: Wheelchair: 0: Activity did not occur (Pt ambulatory) FIM - Locomotion: Ambulation Locomotion: Ambulation Assistive Devices: Administrator Ambulation/Gait Assistance: 5: Supervision Locomotion: Ambulation: 5: Travels 150 ft or more with supervision/safety issues  Comprehension Comprehension Mode: Auditory Comprehension: 6-Follows complex conversation/direction: With extra time/assistive device  Expression Expression Mode: Verbal Expression: 6-Expresses complex ideas: With extra time/assistive device  Social Interaction Social Interaction: 6-Interacts appropriately with others with medication or extra time (anti-anxiety, antidepressant).  Problem Solving Problem Solving: 7-Solves complex problems: Recognizes & self-corrects  Memory Memory: 7-Complete Independence: No helper Medical Problem List and Plan: 1. 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 DVT Prophylaxis/Anticoagulation: SCDs. Venous Doppler studies 03/06/2014 negative 3. Pain Management: Oxycodone and Robaxin as needed -encouraged him to take oxycodone prior to activity with therapy 4. ID/enterococcal bacteremia. Intravenous ampicillin and Rocephin until 04/15/2014 completing a six-week course.  -plan is for conservative care regarding his back as well unless symptoms and pathology progress. 5. Neuropsych: This patient is capable of making decisions on his own behalf. 6. Skin/Wound Care: Routine skin checks 7. Fluids/Electrolytes/Nutrition: Strict I&O's. Follow-up chemistries normal. Appetite reasonable. 8. Diabetes mellitus. Hemoglobin A1c 10.3. NovoLog 6 units 3 times a day, Lantus insulin 25 units daily at bedtime.   -fair control at present 9. Hypertension. Lisinopril 2.5 mg daily, Lopressor 25 mg twice a day.     -control pain 10. Hyperlipidemia. Lipitor  LOS (Days) 4 A FACE TO FACE EVALUATION WAS PERFORMED  Charlett Blake 03/12/2014 8:23 AM

## 2014-03-12 NOTE — Progress Notes (Signed)
Physical Therapy Session Note  Patient Details  Name: [name] MRN: [id number] Date of Birth: 1965-05-05  Today's Date: 03/12/2014 PT Individual Time: 0900-1000 PT Individual Time Calculation (min): 60 min   Short Term Goals: Week 1:  PT Short Term Goal 1 (Week 1): Pt will perform supine<>sit with HOB flat, no rails, and supervision using logroll technique. PT Short Term Goal 2 (Week 1): Pt will transfer from bed<>w/c using LRAD with min A and 25% cueing. PT Short Term Goal 3 (Week 1): Pt will perform gait x150' in controlled environment with supervision using LRAD. PT Short Term Goal 4 (Week 1): Pt will negotiate 2 steps without rails with LRAD requiring min A and 25% cueing for technique.  Skilled Therapeutic Interventions/Progress Updates:    Pt received supine in bed, agreeable to participate in therapy. Session focused on high level gait, bed mobility, stair negotiation, dynamic standing balance. Pt able to name 4/4 back precautions (bend, lift, twist, arch) w/ min cueing. Ambulated >200' throughout session w/ RW, MinGuard A. Ambulated around cones, then with side to side and vertical head turns. Side stepping w/ hallway rail. Noted decreased ambulation speed with dynamic challenges, but no LOB, pt continued to require MinGuard assist for all tasks. Bed mobility from flat mat table to simulate home environment, blocked practice moving supine<>sit and moving supine<>L sidelying with log roll technique, min cueing to maintain precautions, SBA overall for bed mobility. Practiced negotiating up/down 2 stairs to simulate home entry w/ technique of placing RW on top landing and using it to steady pt as he ascended 2 stairs forwards, then descending backwards with RW on top landing. Pt educated on lifting precaution and having wife assist with placing RW on top landing. Session ended in pt's room, where he was left supine in bed w/ all needs within reach.   Therapy Documentation Precautions:   Precautions Precautions: Fall, Back Precaution Booklet Issued: No Restrictions Weight Bearing Restrictions: No Pain: 2/10 in back  See FIM for current functional status  Therapy/Group: Individual Therapy  Rada Hay  Rada Hay, PT, DPT 03/12/2014, 7:46 AM

## 2014-03-13 ENCOUNTER — Inpatient Hospital Stay (HOSPITAL_COMMUNITY): Payer: BC Managed Care – PPO | Admitting: Physical Therapy

## 2014-03-13 ENCOUNTER — Inpatient Hospital Stay (HOSPITAL_COMMUNITY): Payer: BC Managed Care – PPO | Admitting: Occupational Therapy

## 2014-03-13 LAB — GLUCOSE, CAPILLARY
GLUCOSE-CAPILLARY: 97 mg/dL (ref 70–99)
GLUCOSE-CAPILLARY: 186 mg/dL — AB (ref 70–99)
Glucose-Capillary: 122 mg/dL — ABNORMAL HIGH (ref 70–99)
Glucose-Capillary: 135 mg/dL — ABNORMAL HIGH (ref 70–99)

## 2014-03-13 NOTE — Progress Notes (Signed)
[facility] [place] PHYSICAL MEDICINE & REHABILITATION     PROGRESS NOTE    Subjective/Complaints:  Standing with little discomfort, OT asking about showering  Review of Systems - Negative except pain in back with movement  Objective: Vital Signs: Blood pressure 124/66, pulse 61, temperature 98.1 F (36.7 C), temperature source Oral, resp. rate 18, height 6\' 6"  (1.981 m), weight 84.505 kg (186 lb 4.8 oz), SpO2 93 %. No results found. No results for input(s): WBC, HGB, HCT, PLT in the last 72 hours. No results for input(s): NA, K, CL, GLUCOSE, BUN, CREATININE, CALCIUM in the last 72 hours.  Invalid input(s): CO CBG (last 3)   Recent Labs  03/12/14 1621 03/12/14 2046 03/13/14 0635  GLUCAP 145* 149* 122*    Wt Readings from Last 3 Encounters:  03/08/14 84.505 kg (186 lb 4.8 oz)  03/02/14 93.3 kg (205 lb 11 oz)  03/01/14 86.183 kg (190 lb)    Physical Exam:  Constitutional: Patient is cooperative with exam. He is oriented to person, place, and time. He appears well-developed. Appears to be more comfortable today. HENT: Tongue is midline/face is symmetrical Head: Normocephalic.  Eyes: EOM are normal.  Neck: Normal range of motion. Neck supple. No thyromegaly present.  Cardiovascular: Normal rate and regular rhythm.  Respiratory: Effort normal and breath sounds normal. No respiratory distress.  GI: Soft. Bowel sounds are normal. He exhibits no distension.  Neurological: He is alert and oriented to person, place, and time. CN exam intact. Appears to have normal insight and awareness. Memory functional. UES: grossly 5/5 proximal to distal. LES: proximally inhibited by pain, 4/5 HF,  KE, 5/5 ADF/APF. No sensory deficits. DTR's 1+ M/S: no pain with proximal leg movement and with trunk movement.  Skin: Skin is warm and dry/ no breakdown, healing incisions in BUE from multiple lipoma removal Psych: mood pleasant and appropriate   Assessment/Plan: 1. Functional deficits  secondary to septic bicerebral emboli, lumbar diskitis which require 3+ hours per day of interdisciplinary therapy in a comprehensive inpatient rehab setting. Physiatrist is providing close team supervision and 24 hour management of active medical problems listed below. Physiatrist and rehab team continue to assess barriers to discharge/monitor patient progress toward functional and medical goals. FIM: FIM - Bathing Bathing Steps Patient Completed: Chest, Right Arm, Left Arm, Abdomen, Front perineal area, Right upper leg, Left upper leg, Buttocks Bathing: 3: Mod-Patient completes 5-7 74f 10 parts or 50-74%  FIM - Upper Body Dressing/Undressing Upper body dressing/undressing steps patient completed: Thread/unthread right sleeve of pullover shirt/dresss, Thread/unthread left sleeve of pullover shirt/dress, Put head through opening of pull over shirt/dress, Pull shirt over trunk Upper body dressing/undressing: 5: Set-up assist to: Obtain clothing/put away FIM - Lower Body Dressing/Undressing Lower body dressing/undressing steps patient completed: Pull underwear up/down, Pull pants up/down, Thread/unthread right underwear leg, Thread/unthread left underwear leg Lower body dressing/undressing: 3: Mod-Patient completed 50-74% of tasks  FIM - Toileting Toileting steps completed by patient: Adjust clothing prior to toileting, Performs perineal hygiene, Adjust clothing after toileting Toileting Assistive Devices: Grab bar or rail for support Toileting: 4: Steadying assist  FIM - Radio producer Devices: Grab bars Toilet Transfers: 4-To toilet/BSC: Min A (steadying Pt. > 75%), 4-From toilet/BSC: Min A (steadying Pt. > 75%)  FIM - Bed/Chair Transfer Bed/Chair Transfer Assistive Devices: Walker, HOB elevated, Arm rests, Bed rails Bed/Chair Transfer: 5: Supine > Sit: Supervision (verbal cues/safety issues), 5: Sit > Supine: Supervision (verbal cues/safety issues), 4: Bed >  Chair or W/C: Min  A (steadying Pt. > 75%), 4: Chair or W/C > Bed: Min A (steadying Pt. > 75%)  FIM - Locomotion: Wheelchair Distance: 120 Locomotion: Wheelchair: 0: Activity did not occur FIM - Locomotion: Ambulation Locomotion: Ambulation Assistive Devices: Administrator Ambulation/Gait Assistance: 5: Supervision, 4: Min guard Locomotion: Ambulation: 4: Travels 150 ft or more with minimal assistance (Pt.>75%)  Comprehension Comprehension Mode: Auditory Comprehension: 6-Follows complex conversation/direction: With extra time/assistive device  Expression Expression Mode: Verbal Expression: 6-Expresses complex ideas: With extra time/assistive device  Social Interaction Social Interaction: 6-Interacts appropriately with others with medication or extra time (anti-anxiety, antidepressant).  Problem Solving Problem Solving: 7-Solves complex problems: Recognizes & self-corrects  Memory Memory: 7-Complete Independence: No helper Medical Problem List and Plan: 1. 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 DVT Prophylaxis/Anticoagulation: SCDs. Venous Doppler studies 03/06/2014 negative 3. Pain Management: Oxycodone and Robaxin as needed -encouraged him to take oxycodone prior to activity with therapy 4. ID/enterococcal bacteremia. Intravenous ampicillin and Rocephin until 04/15/2014 completing a six-week course.  -plan is for conservative care regarding his back as well unless symptoms and pathology progress.Will need ID f/u 5. Neuropsych: This patient is capable of making decisions on his own behalf. 6. Skin/Wound Care: Routine skin checks 7. Fluids/Electrolytes/Nutrition: Strict I&O's. Follow-up chemistries normal. Appetite reasonable. 8. Diabetes mellitus. Hemoglobin A1c 10.3. NovoLog 6 units 3 times a day, Lantus insulin 25 units daily at bedtime.   -fair control at present 9. Hypertension. Lisinopril 2.5 mg  daily, Lopressor 25 mg twice a day.    -control pain 10. Hyperlipidemia. Lipitor  LOS (Days) 5 A FACE TO FACE EVALUATION WAS PERFORMED  Victory Strollo E 03/13/2014 8:05 AM

## 2014-03-13 NOTE — Plan of Care (Signed)
Problem: RH PAIN MANAGEMENT Goal: RH STG PAIN MANAGED AT OR BELOW PT'S PAIN GOAL <7  Outcome: Not Progressing Reports pain as 9

## 2014-03-13 NOTE — Progress Notes (Signed)
Physical Therapy Session Note  Patient Details  Name: [name] MRN: [id number] Date of Birth: 27-Mar-1966  Today's Date: 03/13/2014 PT Individual Time: 0240-9735 and 1400-1445 PT Individual Time Calculation (min): 60 min and 45 min  Short Term Goals: Week 1:  PT Short Term Goal 1 (Week 1): Pt will perform supine<>sit with HOB flat, no rails, and supervision using logroll technique. PT Short Term Goal 2 (Week 1): Pt will transfer from bed<>w/c using LRAD with min A and 25% cueing. PT Short Term Goal 3 (Week 1): Pt will perform gait x150' in controlled environment with supervision using LRAD. PT Short Term Goal 4 (Week 1): Pt will negotiate 2 steps without rails with LRAD requiring min A and 25% cueing for technique.  Skilled Therapeutic Interventions/Progress Updates:    Treatment Session 1: Pt received semi reclined in bed; agreeable to therapy. Session focused on community mobility, increasing stability/independence with stair and curb step negotiation. Pt performed functional ambulation x150' then x2050' in controlled, home, and community environments with rolling walker and supervision/setup for management of IV pole. In hospital lobby, pt ambulated over various surfaces (carpet, tile, thresholds) and negotiated obstacles in gift shop with supervision/setup for IV management. Pt able to safely perform sit<>stand on bench in hospital lobby with rolling walker and supervision. Returned to rehab unit, where pt performed supine<>sit with mod I, increased time with effective log roll technique.  In treatment gym, pt negotiated 5 stairs without rails, ascending forward and descending backwards (to prevent thoracic/lumbar spine flexion for adherence to spinal precautions) with supervision/setup for IV pole management. Pt did not require manual stabilization of rolling walker. Pt then negotiated single curb step (to simulate home door threshold), ascending backwards and descending forwards (for  adherence to precautions) with rolling walker and supervision/setup for IV management.  Session ended in pt room, where pt was left supine in bed with IV nurse present and all needs within reach.  Treatment Session 2: Pt received semi reclined in bed with wife present. Pt agreeable to therapy but reporting significant "soreness" in lower back, which pt attributes to AM session. Session focused on family education/training with wife as well as trial with standard cane.   Discussed the following with both pt and wife: PT goals, recommended PT follow up, home setup, and how to modify home setup to increase pt safety at D/C.  Pt/wife verbalized understanding and were in full agreement.  Pt performed gait x150' in controlled environment with SPC and close supervision to min guard. Pt negotiated 3 stairs without rails (to simulate home environment) with SPC and min guard to min A. Pt reporting significant pain in lower back at this time. Palpation revealed muscle spasms in R paraspinals. Notified RN. Per pt request, returned to room, was left semi reclined in bed with bed alarm on and all needs within reach. CSW [name] present post-session to discuss D/C planning with pt/wife.  Therapy Documentation Precautions:  Precautions Precautions: Fall, Back Precaution Booklet Issued: No Restrictions Weight Bearing Restrictions: No General: PT Amount of Missed Time (min): 15 Minutes PT Missed Treatment Reason: Pain Vital Signs: Therapy Vitals Pulse Rate: 80 BP: 136/78 mmHg Oxygen Therapy SpO2: 98 % O2 Device: Not Delivered Pain: Pain Assessment Pain Assessment: 0-10 Pain Score: 6  Pain Type: Acute pain Pain Location: Back Pain Orientation: Lower Pain Descriptors / Indicators: Aching Pain Onset: With Activity Pain Intervention(s): Rest Locomotion : Ambulation Ambulation/Gait Assistance: 5: Supervision;4: Min guard   See FIM for current functional status  Therapy/Group:  Individual  Therapy  [name], [name] 03/13/2014, 3:00 PM

## 2014-03-13 NOTE — Progress Notes (Signed)
Occupational Therapy Session Note  Patient Details  Name: [name] MRN: [id number] Date of Birth: [birth year]  Today's Date: 03/13/2014 OT Individual Time: 5449-2010 OT Individual Time Calculation (min): 60 min    Short Term Goals: Week 1:  OT Short Term Goal 1 (Week 1): Pt will complete bathing with min assist using AE PRN OT Short Term Goal 2 (Week 1): Pt will complete LB dressing with mod assist using AE PRN OT Short Term Goal 3 (Week 1): Pt will complete toilet transfers with min assist utilizing RW OT Short Term Goal 4 (Week 1): Pt will complete 2/4 grooming tasks in standing with min assist  Skilled Therapeutic Interventions/Progress Updates:    Engaged in ADL retraining with focus on sit <> stand, functional mobility, and use of AE to increase independence while adhering to back precautions to complete self-care tasks.  Pt received in bed still hooked up to running IV line, therefore engaged in sponge bath at EOB.  Pt required setup assist and supervision while standing to wash buttocks.  Utilized long handled sponge, reacher, and sock aid to complete LB bathing and dressing with carryover from previous education.  Discussed shower transfers and alternating between sponge bath and shower at home as energy warrants and discussed supplies for wife to cover PICC line to keep dry.  Pt demonstrating increased activity tolerance and balance in standing with self-care tasks.  Ambulated to toilet with close supervision while managing IV pole and completing toileting tasks in standing.  Discussed increasing standing tasks as pt requested to complete oral care seated this session.  Pt left seated upright in straight back chair.  Spoke with RN to schedule IV meds and therapy to coincide to allow pt to complete bathing at shower level.    Therapy Documentation Precautions:  Precautions Precautions: Fall, Back Precaution Booklet Issued: No Restrictions Weight Bearing Restrictions:  No General:   Vital Signs: Therapy Vitals Temp: 98.1 F (36.7 C) Temp Source: Oral Pulse Rate: 61 BP: 124/66 mmHg Oxygen Therapy SpO2: 98 % O2 Device: Not Delivered Pain: Pain Assessment Pain Assessment: 0-10 Pain Score: 5  Pain Type: Acute pain Pain Location: Back Pain Orientation: Lower Pain Descriptors / Indicators: Aching Pain Intervention(s): Medication (See eMAR)  See FIM for current functional status  Therapy/Group: Individual Therapy  [name] 03/13/2014, 9:13 AM

## 2014-03-14 ENCOUNTER — Inpatient Hospital Stay (HOSPITAL_COMMUNITY): Payer: BC Managed Care – PPO | Admitting: Physical Therapy

## 2014-03-14 ENCOUNTER — Inpatient Hospital Stay (HOSPITAL_COMMUNITY): Payer: BC Managed Care – PPO | Admitting: Occupational Therapy

## 2014-03-14 DIAGNOSIS — I38 Endocarditis, valve unspecified: Secondary | ICD-10-CM

## 2014-03-14 LAB — GLUCOSE, CAPILLARY
GLUCOSE-CAPILLARY: 77 mg/dL (ref 70–99)
Glucose-Capillary: 136 mg/dL — ABNORMAL HIGH (ref 70–99)
Glucose-Capillary: 135 mg/dL — ABNORMAL HIGH (ref 70–99)
Glucose-Capillary: 90 mg/dL (ref 70–99)

## 2014-03-14 NOTE — Progress Notes (Signed)
Patient ID: [name], male   DOB: 1965/08/14, 48 y.o.   MRN: [id number]      [address]       [place] [address]             [phone]                      LOS: 6 days   Subjective: Feels better, ambulating better, still major complaint is back pain No fever or chills, no new neuro symptoms  Objective: Vital signs in last 24 hours: Patient Vitals for the past 24 hrs:  BP Temp Temp src Pulse Resp SpO2  03/14/14 0605 123/76 mmHg 98.4 F (36.9 C) Oral 62 20 100 %  03/13/14 2049 - - - - - 100 %  03/13/14 2021 126/80 mmHg - - 78 - -  03/13/14 1500 113/67 mmHg 98.7 F (37.1 C) Oral 78 18 100 %    Filed Weights   03/08/14 1330  Weight: 186 lb 4.8 oz (84.505 kg)    Hemodynamic parameters for last 24 hours:    Intake/Output from previous day: 12/14 0701 - 12/15 0700 In: 1480 [P.O.:680; IV Piggyback:800] Out: 1125 [Urine:1125] Intake/Output this shift: Total I/O In: 240 [P.O.:240] Out: 225 [Urine:225]  Scheduled Meds: . ampicillin (OMNIPEN) IV  2 g Intravenous 6 times per day  . atorvastatin  10 mg Oral q1800  . cefTRIAXone (ROCEPHIN)  IV  2 g Intravenous Q12H  . fluticasone  2 puff Inhalation BID  . insulin aspart  0-15 Units Subcutaneous TID WC  . insulin aspart  6 Units Subcutaneous TID WC  . insulin glargine  25 Units Subcutaneous QHS  . lisinopril  2.5 mg Oral Daily  . metoprolol tartrate  25 mg Oral BID  . senna-docusate  1 tablet Oral BID  . sodium chloride  10-40 mL Intracatheter Q12H   Continuous Infusions:  PRN Meds:.acetaminophen, albuterol, methocarbamol, ondansetron **OR** ondansetron (ZOFRAN) IV, oxyCODONE, sodium chloride, sorbitol, traZODone  General appearance: alert and cooperative Neurologic: intact Heart: regular rate and rhythm, S1, S2 normal, no murmur, click, rub or gallop Lungs: clear to auscultation bilaterally Abdomen: soft, non-tender; bowel sounds normal; no masses,  no organomegaly Extremities: no evidence  peripherial emboli No murmur of mr  Lab Results: CBC:No results for input(s): WBC, HGB, HCT, PLT in the last 72 hours. BMET: No results for input(s): NA, K, CL, CO2, GLUCOSE, BUN, CREATININE, CALCIUM in the last 72 hours.  PT/INR: No results for input(s): LABPROT, INR in the last 72 hours.   Radiology No results found.   Assessment/Plan: Will need follow cardiology appointment Has appointment to see me Jan14 Will need follow up echo by cardiology after treatment complete No obvious heart failure symptoms currently  Grace Isaac MD 03/14/2014 11:00 AM

## 2014-03-14 NOTE — Progress Notes (Signed)
Occupational Therapy Session Note  Patient Details  Name: [name] MRN: [id number] Date of Birth: 11-22-1965  Today's Date: 03/14/2014 OT Individual Time: 4709-6283 OT Individual Time Calculation (min): 60 min    Short Term Goals: Week 1:  OT Short Term Goal 1 (Week 1): Pt will complete bathing with min assist using AE PRN OT Short Term Goal 2 (Week 1): Pt will complete LB dressing with mod assist using AE PRN OT Short Term Goal 3 (Week 1): Pt will complete toilet transfers with min assist utilizing RW OT Short Term Goal 4 (Week 1): Pt will complete 2/4 grooming tasks in standing with min assist  Skilled Therapeutic Interventions/Progress Updates:    Engaged in ADL retraining with focus on increased independence with self-care tasks, functional transfers and mobility with RW, and adherence to back precautions during self-care tasks.  Educated pt on goals of Mod I and encouraged pt to increase independence with tasks as he has tended to prefer setup from wife or therapist.  Educated on use of RW to transport items to increase safety with mobility.  Pt gathered all items and completed toilet transfer and walk-in shower transfer with mod I with use of RW.  Pt utilized long handled sponge, reacher, and sock aid to complete bathing and dressing tasks.  Wife arrived at end of session and discussed progress towards goals and recommendation of possible d/c home Weds.  Recommend use of RW with all mobility. Wife reports she has purchased a shower chair and neighbor will help lower bed at home prior to d/c home and she is pleased with his progress.   Therapy Documentation Precautions:  Precautions Precautions: Fall, Back Precaution Booklet Issued: No Restrictions Weight Bearing Restrictions: No Pain: Pain Assessment Pain Assessment: 0-10 Pain Score: 2  Pain Intervention(s): Medication (See eMAR)  See FIM for current functional status  Therapy/Group: Individual Therapy  [name] 03/14/2014, 10:48 AM

## 2014-03-14 NOTE — Progress Notes (Signed)
Physical Therapy Session Note  Patient Details  Name: [name] MRN: [id number] Date of Birth: 08-21-1965  Today's Date: 03/14/2014 PT Individual Time: 0930-1030 and 1030-1045 (Make up session)  PT Individual Time Calculation (min): 60 min and 15 min  Short Term Goals: Week 1:  PT Short Term Goal 1 (Week 1): Pt will perform supine<>sit with HOB flat, no rails, and supervision using logroll technique. PT Short Term Goal 2 (Week 1): Pt will transfer from bed<>w/c using LRAD with min A and 25% cueing. PT Short Term Goal 3 (Week 1): Pt will perform gait x150' in controlled environment with supervision using LRAD. PT Short Term Goal 4 (Week 1): Pt will negotiate 2 steps without rails with LRAD requiring min A and 25% cueing for technique.  Skilled Therapeutic Interventions/Progress Updates:    Treatment Session 1: Pt received semi reclined in bed with wife present; agreeable to therapy. Session focused on completion of family training, assessing/addressing stability/independence with functional mobility. Pt performed the following aspects of functional mobility with supervision (initial trial with supervision of this PT; subsequent trial with supervision of wife): simulated car transfer with rolling walker; negotiation of two (7.5") stairs without rails with rolling walker and supervision, manual stabilization of rolling walker. Pt performed the following with modified independence: functional ambulation >300' in controlled, home, and community environments (inside/outside hospital lobby) over multiple surface types with rolling walker and no overt LOB negotiation of  standard ramp, and standard curb step (self-selected gait speed= .5 m/s). Returned ot rehab unit, where pt performed sit<>stand and supine<>sit on 30" bed (to simulate home environment) with rolling walker and mod I. In rehab apartment, pt performed sit<>stand from couch. Pt and wife (present throughout session) both express feeling  comfortable with all aspects of functional mobility.  Treatment Session 2: Pt participated in scheduled make up session focusing on HEP emphasizing core stabilization. See below for detailed description of therapeutic exercises. Paper handout provided to promote carryover. Session ended in pt room, where pt was left semi reclined in bed with bed alarm on and all needs within reach.   Therapy Documentation Precautions:  Precautions Precautions: Fall, Back Precaution Booklet Issued: No Restrictions Weight Bearing Restrictions: No Pain: Pain Assessment Pain Assessment: 0-10 Pain Score: 5  Pain Location: Back Pain Orientation: Lower Pain Descriptors / Indicators: Aching Pain Onset: With Activity Pain Intervention(s): Cold applied;Repositioned Multiple Pain Sites: No Mobility: Bed Mobility Bed Mobility: Rolling Right;Supine to Sit;Sit to Supine;Rolling Left Rolling Right: 6: Modified independent (Device/Increase time) Rolling Left: 6: Modified independent (Device/Increase time) Supine to Sit: 6: Modified independent (Device/Increase time) Sit to Supine: 6: Modified independent (Device/Increase time) Transfers Transfers: Yes Sit to Stand: 6: Modified independent (Device/Increase time) Stand to Sit: 6: Modified independent (Device/Increase time) Stand Pivot Transfers: 6: Modified independent (Device/Increase time) Stand Pivot Transfer Details (indicate cue type and reason): using rolling walker Exercises: :  Session 2: Pt educated on the following home exercise program while supine, hook lying on mat table, pt performed the following therapeutic exercises: transverse abdominus activation (TA) x10 reps (x3-second holds each) for core stabilization; TA activation with concurrent alternating reciprocal hip flexion 5 trials x6 consecutive reps (x2-second hollds) for dynamic spinal stabilization; isometric quadratus lumborum activation (within spinal precautions) x5 reps on L, x3 reps on R (to  pt tolerance) . Verbal, demonstration, and tactile cueing required with all exercises to facilitate safe/proper technique. Pt verbalized understanding and gave effective return demonstration.   See FIM for current functional status  Therapy/Group: Individual  Therapy  Nickayla Mcinnis, Malva Cogan 03/14/2014, 2:54 PM

## 2014-03-14 NOTE — Discharge Instructions (Signed)
Inpatient Rehab Discharge Instructions  [facility] Discharge date and time: No discharge date for patient encounter.   Activities/Precautions/ Functional Status: Activity: activity as tolerated Diet: diabetic diet Wound Care: none needed Functional status:  ___ No restrictions     ___ Walk up steps independently ___ 24/7 supervision/assistance   ___ Walk up steps with assistance ___ Intermittent supervision/assistance  ___ Bathe/dress independently ___ Walk with walker     ___ Bathe/dress with assistance ___ Walk Independently    ___ Shower independently _x__ Walk with assistance    ___ Shower with assistance ___ No alcohol     ___ Return to work/school ________  Special Instructions: Continue intravenous ampicillin and Rocephin until 04/15/2014 per infectious disease Dr. Johnnye Sima   COMMUNITY REFERRALS UPON DISCHARGE:    Home Health:   [facility] EYCXK:[phone] Date of last service:03/15/2014  Medical Equipment/Items [place]   GENERAL COMMUNITY RESOURCES FOR PATIENT/FAMILY: Support Groups:[facility] SUPPORT GROUP    My questions have been answered and I understand these instructions. I will adhere to these goals and the provided educational materials after my discharge from the hospital.  Patient/Caregiver Signature _______________________________ Date __________  Clinician Signature _______________________________________ Date __________  Please bring this form and your medication list with you to all your follow-up doctor's appointments.

## 2014-03-14 NOTE — Progress Notes (Signed)
Social Work Patient ID: [name], male   DOB: Dec 15, 1965, 48 y.o.   MRN: [id number] Team feels pt will reach his goals by tomorrow and feel ready for discharge as long as medically stable.  MD feels medically stable and ready for discharge tomorrow. Will order rolling walker and wife got tub seat for.  Have arranged Power follow up.  Prepare for discharge tomorrow.

## 2014-03-14 NOTE — Discharge Summary (Signed)
Discharge summary job # [id number]

## 2014-03-14 NOTE — Progress Notes (Signed)
Physical Therapy Discharge Summary  Patient Details  Name: Artavis Cowie MRN: [id number] Date of Birth: 10-25-1965  Today's Date: 03/14/2014 PT Individual Time:1400-1500   PT Individual Time Calculation (min): 60 min  Patient has met 10 of 10 long term goals due to improved activity tolerance, improved balance, improved postural control, increased strength, decreased pain, ability to compensate for deficits and adherence to precautions.  Patient to discharge at an ambulatory level Modified Independent.   Patient's care partner is independent to provide the necessary physical assistance at discharge.  Reasons goals not met: N/A; all goals met or surpassed.  Recommendation:  Patient will benefit from ongoing skilled PT services in home health setting to continue to advance safe functional mobility, address ongoing impairments in stability/independence with functional mobility, increase activation/endurance of core musculature, and minimize fall risk.  Equipment: rolling walker  Reasons for discharge: treatment goals met and discharge from hospital  Patient/family agrees with progress made and goals achieved: Yes   Skilled Therapeutic Interventions/Progress Updates Session focused on assessing/addressing stability/independence with functional mobility. See below for detailed findings of discharge evaluation, Berg Balance Scale (BBS) completed with score of 42/56; see below for details. Educated pt on findings and functional implications. Explained that BBS was limited by spinal precautions (items specified below). Pt verbalized understanding. Also educated pt on PT goals, findings, progress, and discharge evaluation. Pt verbalized understanding and was in full agreement with discharge plan.  PT Discharge Precautions/Restrictions Precautions Precautions: Fall;Back Precaution Booklet Issued: No Precaution Comments: Pt able to verbalize all spinal precautions Restrictions Weight Bearing  Restrictions: No Pain Pain Assessment Pain Assessment: 0-10 Pain Score: 6  Pain Type: Chronic pain Pain Location: Back Pain Orientation: Lower Pain Descriptors / Indicators: Aching Pain Onset: With Activity Pain Intervention(s): Repositioned;Rest Multiple Pain Sites: No Cognition Overall Cognitive Status: Within Functional Limits for tasks assessed Arousal/Alertness: Awake/alert Orientation Level: Oriented X4 Selective Attention: Appears intact Memory: Appears intact Awareness: Appears intact Problem Solving: Appears intact Safety/Judgment: Appears intact Sensation Sensation Light Touch: Appears Intact Stereognosis: Not tested Hot/Cold: Not tested Proprioception: Appears Intact Coordination Gross Motor Movements are Fluid and Coordinated: Yes Fine Motor Movements are Fluid and Coordinated: Yes Heel Shin Test: WNL in RLE; LLE excursion of movement limited by pain in lower back. Motor  Motor Motor: Within Functional Limits Motor - Discharge Observations: Trunk mobility limited by pain, precautions  Mobility Bed Mobility Bed Mobility: Rolling Right;Supine to Sit;Sit to Supine;Rolling Left Rolling Right: 6: Modified independent (Device/Increase time) Rolling Left: 6: Modified independent (Device/Increase time) Supine to Sit: 6: Modified independent (Device/Increase time) Sit to Supine: 6: Modified independent (Device/Increase time) Transfers Transfers: Yes Sit to Stand: 6: Modified independent (Device/Increase time) Stand to Sit: 6: Modified independent (Device/Increase time) Stand Pivot Transfers: 6: Modified independent (Device/Increase time) Stand Pivot Transfer Details (indicate cue type and reason): using rolling walker Locomotion  Ambulation Ambulation: Yes Ambulation/Gait Assistance: 6: Modified independent (Device/Increase time) Ambulation Distance (Feet): 300 Feet Assistive device: Rolling walker Gait Gait: Yes Gait Pattern: Within Functional Limits Gait  velocity: Self-selected gait speed = .5 m/s High Level Ambulation High Level Ambulation: Side stepping;Backwards walking;Direction changes Side Stepping: Mod I using rolling walker Backwards Walking: Mod I using rolling walker Direction Changes: Mod I using rolling walker Stairs / Additional Locomotion Stairs: Yes Stairs Assistance: 6: Modified independent (Device/Increase time);Other (comment) (increased time) Stairs Assistance Details (indicate cue type and reason): Ascended with reciprocal pattern and descended with step-to pattern. Stair Management Technique: Alternating pattern;Two rails;Forwards;Step to pattern Number of Stairs: 12  Ramp: 6: Modified independent (Device);Other (comment) (using rolling walker) Curb: 6: Modified independent (Device/increase time);Other (comment) (using rolling walker) Wheelchair Mobility Wheelchair Mobility: Yes Wheelchair Assistance: 6: Modified independent (Device/Increase time) Environmental health practitioner: Both upper extremities Wheelchair Parts Management: Independent Distance: 200 Trunk/Postural Assessment  Cervical Assessment Cervical Assessment: Within Functional Limits Thoracic Assessment Thoracic Assessment: Exceptions to [facility] Thoracic AROM Overall Thoracic AROM: Deficits;Due to precautions;Due to pain Lumbar Assessment Lumbar Assessment: Exceptions to [facility] Lumbar AROM Overall Lumbar AROM: Deficits;Due to pain;Due to precautions Postural Control Postural Control: Within Functional Limits  Balance Balance Balance Assessed: Yes Standardized Balance Assessment Standardized Balance Assessment: Berg Balance Test Berg Balance Test Sit to Stand: Able to stand  independently using hands Standing Unsupported: Able to stand safely 2 minutes Sitting with Back Unsupported but Feet Supported on Floor or Stool: Able to sit safely and securely 2 minutes Stand to Sit: Controls descent by using hands Transfers: Able to transfer safely, definite need of  hands Standing Unsupported with Eyes Closed: Able to stand 10 seconds safely Standing Ubsupported with Feet Together: Able to place feet together independently and stand 1 minute safely From Standing, Reach Forward with Outstretched Arm: Can reach confidently >25 cm (10") From Standing Position, Pick up Object from Floor: Unable to try/needs assist to keep balance (Deferred secondary to back precautions) From Standing Position, Turn to Look Behind Over each Shoulder: Needs assist to keep from losing balance and falling (Deferred secondary to spinal precautions) Turn 360 Degrees: Able to turn 360 degrees safely but slowly Standing Unsupported, Alternately Place Feet on Step/Stool: Able to stand independently and safely and complete 8 steps in 20 seconds (11.5 seconds) Standing Unsupported, One Foot in Front: Able to plae foot ahead of the other independently and hold 30 seconds Standing on One Leg: Able to lift leg independently and hold > 10 seconds Total Score: 42 Dynamic Standing Balance Dynamic Standing - Balance Support: During functional activity;No upper extremity supported Dynamic Standing - Level of Assistance: 6: Modified independent (Device/Increase time) Dynamic Standing - Comments: See Berg Balance Scale above. Extremity Assessment  RLE Assessment RLE Assessment: Exceptions to [facility] RLE Strength RLE Overall Strength: Deficits Right Hip Flexion: 4/5 Right Knee Flexion: 5/5 Right Knee Extension: 4/5 Right Ankle Dorsiflexion: 5/5 Right Ankle Plantar Flexion: 5/5 LLE Assessment LLE Assessment: Exceptions to [facility] LLE Strength LLE Overall Strength: Deficits Left Hip Flexion: 3+/5 Left Knee Flexion: 5/5 Left Knee Extension: 4/5 Left Ankle Dorsiflexion: 5/5 Left Ankle Plantar Flexion: 5/5  See FIM for current functional status  Karyme Mcconathy, Malva Cogan 03/14/2014, 8:58 PM

## 2014-03-14 NOTE — Progress Notes (Signed)
[place] PHYSICAL MEDICINE & REHABILITATION     PROGRESS NOTE    Subjective/Complaints:  Occ back spasms, no bowel or bladder issues  Review of Systems - Negative except pain in back with movement  Objective: Vital Signs: Blood pressure 123/76, pulse 62, temperature 98.4 F (36.9 C), temperature source Oral, resp. rate 20, height 6\' 6"  (1.981 m), weight 84.505 kg (186 lb 4.8 oz), SpO2 100 %. No results found. No results for input(s): WBC, HGB, HCT, PLT in the last 72 hours. No results for input(s): NA, K, CL, GLUCOSE, BUN, CREATININE, CALCIUM in the last 72 hours.  Invalid input(s): CO CBG (last 3)   Recent Labs  03/13/14 1650 03/13/14 2053 03/14/14 0641  GLUCAP 97 186* 136*    Wt Readings from Last 3 Encounters:  03/08/14 84.505 kg (186 lb 4.8 oz)  03/02/14 93.3 kg (205 lb 11 oz)  03/01/14 86.183 kg (190 lb)    Physical Exam:  Constitutional: Patient is cooperative with exam. He is oriented to person, place, and time. He appears well-developed. Appears to be more comfortable today. HENT: Tongue is midline/face is symmetrical Head: Normocephalic.  Eyes: EOM are normal.  Neck: Normal range of motion. Neck supple. No thyromegaly present.  Cardiovascular: Normal rate and regular rhythm.  Respiratory: Effort normal and breath sounds normal. No respiratory distress.  GI: Soft. Bowel sounds are normal. He exhibits no distension.  Neurological: He is alert and oriented to person, place, and time. CN exam intact. Appears to have normal insight and awareness. Memory functional. UES: grossly 5/5 proximal to distal. LES: proximally inhibited by pain, 4/5 HF,  KE, 5/5 ADF/APF. No sensory deficits. DTR's 1+ M/S: no pain with proximal leg movement and with trunk movement.  Skin: Skin is warm and dry/ no breakdown, healing incisions in BUE from multiple lipoma removal Psych: mood pleasant and appropriate   Assessment/Plan: 1. Functional deficits secondary to septic  bicerebral emboli, lumbar diskitis which require 3+ hours per day of interdisciplinary therapy in a comprehensive inpatient rehab setting. Physiatrist is providing close team supervision and 24 hour management of active medical problems listed below. Physiatrist and rehab team continue to assess barriers to discharge/monitor patient progress toward functional and medical goals. FIM: FIM - Bathing Bathing Steps Patient Completed: Chest, Right Arm, Left Arm, Abdomen, Front perineal area, Right upper leg, Left upper leg, Buttocks, Right lower leg (including foot), Left lower leg (including foot) Bathing: 5: Set-up assist to: Obtain items  FIM - Upper Body Dressing/Undressing Upper body dressing/undressing steps patient completed: Thread/unthread right sleeve of pullover shirt/dresss, Thread/unthread left sleeve of pullover shirt/dress, Put head through opening of pull over shirt/dress, Pull shirt over trunk Upper body dressing/undressing: 5: Set-up assist to: Obtain clothing/put away FIM - Lower Body Dressing/Undressing Lower body dressing/undressing steps patient completed: Thread/unthread right underwear leg, Thread/unthread left underwear leg, Pull underwear up/down, Thread/unthread right pants leg, Thread/unthread left pants leg, Pull pants up/down, Don/Doff right sock, Don/Doff left sock Lower body dressing/undressing: 5: Set-up assist to: Obtain clothing  FIM - Toileting Toileting steps completed by patient: Adjust clothing prior to toileting, Performs perineal hygiene, Adjust clothing after toileting Toileting Assistive Devices: Grab bar or rail for support Toileting: 5: Supervision: Safety issues/verbal cues  FIM - Radio producer Devices: Grab bars Toilet Transfers: 5-To toilet/BSC: Supervision (verbal cues/safety issues), 5-From toilet/BSC: Supervision (verbal cues/safety issues)  FIM - Control and instrumentation engineer Devices: Cane, HOB  elevated Bed/Chair Transfer: 5: Bed > Chair or W/C: Supervision (verbal  cues/safety issues), 5: Chair or W/C > Bed: Supervision (verbal cues/safety issues), 6: Sit > Supine: No assist, 6: Supine > Sit: No assist  FIM - Locomotion: Wheelchair Distance: 120 Locomotion: Wheelchair: 0: Activity did not occur FIM - Locomotion: Ambulation Locomotion: Ambulation Assistive Devices: Journalist, newspaper Ambulation/Gait Assistance: 5: Supervision, 4: Min guard Locomotion: Ambulation: 4: Travels 150 ft or more with minimal assistance (Pt.>75%)  Comprehension Comprehension Mode: Auditory Comprehension: 7-Follows complex conversation/direction: With no assist  Expression Expression Mode: Verbal Expression: 6-Expresses complex ideas: With extra time/assistive device  Social Interaction Social Interaction: 6-Interacts appropriately with others with medication or extra time (anti-anxiety, antidepressant).  Problem Solving Problem Solving: 7-Solves complex problems: Recognizes & self-corrects  Memory Memory: 6-More than reasonable amt of time Medical Problem List and Plan: 1. 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 DVT Prophylaxis/Anticoagulation: SCDs. Venous Doppler studies 03/06/2014 negative 3. Pain Management: Oxycodone and Robaxin as needed -encouraged him to take oxycodone prior to activity with therapy 4. ID/enterococcal bacteremia. Intravenous ampicillin and Rocephin until 04/15/2014 completing a six-week course.  -plan is for conservative care regarding his back as well unless symptoms and pathology progress.Will need ID f/u 5. Neuropsych: This patient is capable of making decisions on his own behalf. 6. Skin/Wound Care: Routine skin checks 7. Fluids/Electrolytes/Nutrition: Strict I&O's. Follow-up chemistries normal. Appetite reasonable. 8. Diabetes mellitus. Hemoglobin A1c 10.3. NovoLog 6 units 3 times a day, Lantus  insulin 25 units daily at bedtime.   -fair control at present 9. Hypertension. Lisinopril 2.5 mg daily, Lopressor 25 mg twice a day.    -control pain 10. Hyperlipidemia. Lipitor  LOS (Days) 6 A FACE TO FACE EVALUATION WAS PERFORMED  Alysia Penna E 03/14/2014 8:15 AM

## 2014-03-15 ENCOUNTER — Encounter (HOSPITAL_COMMUNITY): Payer: BC Managed Care – PPO | Admitting: Occupational Therapy

## 2014-03-15 LAB — GLUCOSE, CAPILLARY
GLUCOSE-CAPILLARY: 140 mg/dL — AB (ref 70–99)
Glucose-Capillary: 91 mg/dL (ref 70–99)

## 2014-03-15 MED ORDER — ATORVASTATIN CALCIUM 10 MG PO TABS: 10.0000 mg | ORAL_TABLET | Freq: Every day | ORAL | Status: DC

## 2014-03-15 MED ORDER — HEPARIN SOD (PORK) LOCK FLUSH 100 UNIT/ML IV SOLN: 250.0000 [IU] | INTRAVENOUS | Status: DC | PRN

## 2014-03-15 MED ORDER — METHOCARBAMOL 500 MG PO TABS: 500.0000 mg | ORAL_TABLET | Freq: Four times a day (QID) | ORAL | Status: DC | PRN

## 2014-03-15 MED ORDER — INSULIN GLARGINE 100 UNIT/ML ~~LOC~~ SOLN: 25.0000 [IU] | Freq: Every day | SUBCUTANEOUS | Status: DC

## 2014-03-15 MED ORDER — OXYCODONE HCL 10 MG PO TABS: 10.0000 mg | ORAL_TABLET | ORAL | Status: DC | PRN

## 2014-03-15 MED ORDER — LISINOPRIL 2.5 MG PO TABS: 2.5000 mg | ORAL_TABLET | Freq: Every day | ORAL | Status: DC

## 2014-03-15 MED ORDER — METOPROLOL TARTRATE 25 MG PO TABS: 25.0000 mg | ORAL_TABLET | Freq: Two times a day (BID) | ORAL | Status: DC

## 2014-03-15 MED ORDER — METFORMIN HCL 1000 MG PO TABS: 1000.0000 mg | ORAL_TABLET | Freq: Two times a day (BID) | ORAL | Status: DC

## 2014-03-15 NOTE — Progress Notes (Signed)
Patient given discharge information from Helyn Numbers., PA, and all questions answered. Advance nurse educated patient of IV therapy. All belongings packed by wife, nurse tech and therapy tech assisted patient to car. [place]

## 2014-03-15 NOTE — Progress Notes (Signed)
Social Work Discharge Note Discharge Note  The overall goal for the admission was met for:   Discharge location: Yes-HOME WITH WIFE AND 46 YO SON, WIFE CAN PROVIDE SUPERVISION LEVEL  Length of Stay: Yes-7 DAYS  Discharge activity level: Yes-MOD/I LEVEL  Home/community participation: Yes  Services provided included: MD, RD, PT, OT, SLP, RN, CM, TR, Pharmacy and SW  Financial Services: Private Insurance: [employer]  Follow-up services arranged: Home Health: [place], Other: [facility] and Patient/Family has no preference for HH/DME agencies  Comments (or additional information):WIFE EDUCATED REGARDING CARE AND iv ANTIBIOTICS, FEELS PREPARED FOR DISCHARGE  Patient/Family verbalized understanding of follow-up arrangements: Yes  Individual responsible for coordination of the follow-up plan: SELF & [name]-WIFE  Confirmed correct DME delivered: Elease Hashimoto 03/15/2014    Elease Hashimoto

## 2014-03-15 NOTE — Discharge Summary (Signed)
NAMEMarland Kitchen  TAGEN, David NO.:  [id number]  MEDICAL RECORD NO.:  [id number]  LOCATION:  4W17C                        FACILITY:  [facility]  PHYSICIAN:  Charlett Blake, M.D.DATE OF BIRTH:  1965/10/07  DATE OF ADMISSION:  03/08/2014 DATE OF DISCHARGE:  03/15/2014                              DISCHARGE SUMMARY   DISCHARGE DIAGNOSES: 1. 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 2. Sequential compression devices for deep venous thrombosis     prophylaxis. 3. Enterococcal bacteremia with intravenous ampicillin and Rocephin     until April 15, 2014. 4. Diabetes mellitus with peripheral neuropathy. 5. Hypertension. 6. Hyperlipidemia.  HISTORY OF PRESENT ILLNESS:  This is a 48 year old right-handed male with history of hypertension, diabetes mellitus, who was independent prior to admission living with his wife.  Admitted on February 27, 2014, with intractable back pain x4 days, denying any recent trauma as well as fever of 105 rectally.  The patient works for [employer] and travels [employer], recent returning from [place].  MRI of lumbar spine with no acute abnormalities, noted degenerative changes of lumbar L3-4. Sedimentation rate 56.  The patient developed seizures with decompensation, requiring intubation and mechanical ventilation.  MRI of the brain showed small areas of acute infarct bilaterally consistent with cerebral emboli.  MRA of the head, negative.  Echocardiogram with ejection fraction of 54%, grade 1 diastolic dysfunction as well as large echo density, measuring 1.4 x 2 cm, attached to the posterior leaflet of the mitral valve.  Carotid Dopplers with no ICA stenosis.  Infectious Disease consulted for workup of fever.  TEE completed suggesting mitral valve endocarditis.  Venous Doppler studies of lower extremities, negative for DVT.  Blood cultures, gram-positive cocci in  chains. Cardiothoracic Surgery consulted, no surgical intervention at this time. Maintained on Rocephin as well as ampicillin until April 15, 2014, to complete a 6-week course.  Followup MRI lumbar of spine on March 07, 2014, with findings of possible early diskitis, osteomyelitis of lumbar L3-4 with adjacent right paraspinal edema.  Again, contact was made to Infectious Disease to continue antibiotic therapy.  An EEG completed nonspecific without seizure.  He was later extubated, therapies ongoing. The patient was admitted for a comprehensive rehab program.  PAST MEDICAL HISTORY:  See discharge diagnoses.  SOCIAL HISTORY:  Lives with spouse and 79-year-old son.  The patient works for [employer].  FUNCTIONAL HISTORY:  Prior to admission, independent.  FUNCTIONAL STATUS:  Upon admission to [facility], minimal assist to ambulate 40 feet with a rolling walker; moderate assist, sit to stand; min-to-mod assist, activities of daily living.  PHYSICAL EXAMINATION:  VITAL SIGNS:  Blood pressure 128/84, pulse 62, temperature 98, respirations 18. GENERAL:  This was an alert male, oriented x3, well developed. LUNGS:  Clear to auscultation. CARDIAC:  Regular rate and rhythm. ABDOMEN:  Soft, nontender.  Good bowel sounds. EXTREMITIES:  Low back, tenderness to palpation at lumbar L3-L5 level with some associated paraspinal spasms.  REHABILITATION HOSPITAL COURSE:  The patient was admitted to Inpatient Rehab Services with therapies initiated on a 3-hour daily basis consisting of physical therapy, occupational therapy, and rehabilitation nursing.  The following issues were addressed during the patient's rehabilitation stay.  Pertaining to [name] septic emboli to the brain from mitral valve endocarditis with lumbar diskitis remained stable, he would continue intravenous antibiotics until April 15, 2014, and follow up Infectious Disease, Dr. Johnnye Sima.  He remained afebrile. Sequential  compression devices for DVT prophylaxis.  Venous Doppler studies on March 06, 2014, negative.  Pain management with the use of oxycodone and Robaxin with good results.  He did have a history of diabetes mellitus, peripheral neuropathy, hemoglobin A1c 10.3.  He remained on insulin therapy as advised as well as ongoing diabetic teaching.  Blood pressure was controlled on lisinopril as well as Lopressor.  The patient received weekly collaborative interdisciplinary team conferences to discuss estimated length of stay, family teaching, any barriers to his discharge.  The patient ambulating in excess of 250 feet in a controlled environment with assistive device, management of the IV pole.  Ambulated over various surfaces, negotiated obstacles. The patient safety performed sit-to-stand with supervision.  Activities of daily living, he was able to gather his belongings for dressing, grooming, bathing, full family teaching completed with his wife, plan was for home health therapies.  DISCHARGE MEDICATIONS:  At the time of dictation included: 1. Intravenous ampicillin 2 g every 4 hours until April 15, 2014 and     stop. 2. Lipitor 10 mg p.o. daily. 3. Rocephin 2 g every 12 hours until April 15, 2014 and stop. 4. Flovent inhaler two puffs twice daily. 5. Lantus insulin 25 units subcutaneous at bedtime. 6. Lisinopril 2.5 mg p.o. daily. 7. Robaxin 500 mg p.o. every 6 hours as needed for muscle spasms. 8. Lopressor 25 mg p.o. b.i.d. 9. Oxycodone immediate release 10 mg every 4 hours as needed for     moderate pain, dispense of 90 tablets.  DIET:  Diabetic diet.  SPECIAL INSTRUCTIONS:  The patient would follow up with Dr. Alysia Penna at the [facility] as needed; Dr. Bobby Rumpf, call for appointment; Dr. Peter Martinique, call for appointment; Dr. Lanelle Bal, appointment made for April 13, 2014. The patient also would need followup echocardiogram by  Cardiology Services after antibiotics completed.     Lauraine Rinne, P.A.   ______________________________ Charlett Blake, M.D.    DA/MEDQ  D:  03/14/2014  T:  03/15/2014  Job:  [id number]  cc:   Peter M. Martinique, M.D. Doroteo Bradford. Johnnye Sima, M.D. Orpah Melter, M.D. Lanelle Bal, MD

## 2014-03-15 NOTE — Patient Care Conference (Signed)
Inpatient RehabilitationTeam Conference and Plan of Care Update Date: 03/15/2014   Time: 10;30 AM    Patient Name: [name]      Medical Record Number: [id number]  Date of Birth: 10/27/1965 Sex: Male         Room/Bed: 4W17C/4W17C-01 Payor Info: Payor: [place] / Plan: BCBS PPO OUT OF STATE / Product Type: *No Product type* /    Admitting Diagnosis: CVA  Admit Date/Time:  03/08/2014  1:17 PM Admission Comments: No comment available   Primary Diagnosis:  <principal problem not specified> Principal Problem: <principal problem not specified>  Patient Active Problem List   Diagnosis Date Noted  . Discitis of lumbar region 03/08/2014  . Cerebral septic emboli   . Enterococcal bacteremia 03/07/2014  . Diskitis 03/07/2014  . Osteomyelitis of lumbar vertebra 03/07/2014  . Solitary kidney, acquired 03/07/2014  . Acute encephalopathy 03/07/2014  . Acute bacterial endocarditis   . Malnutrition of moderate degree 03/01/2014  . Cardiomyopathy- EF 30-35% 03/01/2014  . CVA (cerebral infarction)- embolic 14/48/1856  . Acute respiratory failure with hypoxia   . Gram-positive bacteremia   . Respiratory failure, acute   . Severe sepsis   . Acute respiratory failure   . Back pain 02/27/2014  . Intractable back pain 02/27/2014  . Hyperglycemia 02/27/2014  . DM (diabetes mellitus), type 2, uncontrolled 02/27/2014  . Hyperlipidemia 02/27/2014  . Family history of ischemic heart disease 04/05/2013  . HYPERTENSION, BENIGN 03/12/2008  . PALPITATIONS 03/12/2008    Expected Discharge Date: Expected Discharge Date: 03/15/14  Team Members Present: Physician leading conference: Dr. Alysia Penna Social Worker Present: Ovidio Kin, LCSW Nurse Present: Heather Roberts, RN PT Present: Billie Ruddy, Renaye Rakers, PT OT Present: [name], OT SLP Present: Windell Moulding, SLP PPS Coordinator present : Daiva Nakayama, RN, CRRN     Current Status/Progress Goal Weekly Team Focus   Medical   learning how to use IV abx  Home abx for D/C  Pt education   Bowel/Bladder   Continent of bowel and bladder. LBM 03/13/14  Pt to remain continent of bowel and bladder  Monitor   Swallow/Nutrition/ Hydration     na        ADL's   supervision overall  mod I overall  shower transfers, bathing at shower level, D/C planning   Mobility   Supervision overall  Mod I overall  high level ambulation, community mobility, obstacle negotiation, D/C planning   Communication     na        Safety/Cognition/ Behavioral Observations    no unsafe behaviors        Pain   Request Oxy IR 68m q 4hrs for back discomfort  <7  Introduce therapeutic techniques for pain control   Skin   CDI (old surgical incision-healed)  No additional skin breakdown  Assess q shift      *See Care Plan and progress notes for long and short-term goals.  Barriers to Discharge: none    Possible Resolutions to Barriers:  d/c home today    Discharge Planning/Teaching Needs:    Home with wife who can who is taking time off to provide care to pt. Will learn IV antibiotics at home.     Team Discussion:  Met goals of mod/i level/supervision, wife has been trained. IV antibiotics arranged and will have follow up PT. Ready for discharge today  Revisions to Treatment Plan:  D/C today   Continued Need for Acute Rehabilitation Level of Care: The patient requires  daily medical management by a physician with specialized training in physical medicine and rehabilitation for the following conditions: Daily direction of a multidisciplinary physical rehabilitation program to ensure safe treatment while eliciting the highest outcome that is of practical value to the patient.: Yes Daily medical management of patient stability for increased activity during participation in an intensive rehabilitation regime.: Yes Daily analysis of laboratory values and/or radiology reports with any subsequent need for medication adjustment of  medical intervention for : Neurological problems;Other  Donyel Nester, Gardiner Rhyme 03/15/2014, 1:44 PM

## 2014-03-15 NOTE — Progress Notes (Signed)
Occupational Therapy Note  Patient Details  Name: [name] MRN: [id number] Date of Birth: [birth year]  Pt initially scheduled for final bathing and dressing session prior to d/c home, however unable to engage at shower level due to PICC line still connected and running.  Pt has demonstrated progress towards goals and has no further questions.  D/C from OT.   [name] 03/15/2014, 12:13 PM

## 2014-03-15 NOTE — Progress Notes (Signed)
Occupational Therapy Discharge Summary  Patient Details  Name: [name] MRN: [id number] Date of Birth: 08-07-1965   Patient has met 9 of 9 long term goals due to improved activity tolerance, postural control and ability to compensate for deficits.  Patient to discharge at overall Modified Independent level.  Patient's care partner is independent to provide the necessary assistance at discharge.    Reasons goals not met: NA  Recommendation:  Patient will not require OT follow up at this time.  Equipment: No equipment provided  Reasons for discharge: treatment goals met and discharge from hospital  Patient/family agrees with progress made and goals achieved: Yes  OT Discharge Precautions/Restrictions  Precautions Precautions: Fall;Back Precaution Comments: Pt able to verbalize all spinal precautions General   Vital Signs Therapy Vitals Pulse Rate: 65 BP: 135/73 mmHg Oxygen Therapy O2 Device: Not Delivered Pain  Pt with no c/o pain, reports he stays about a 4-5 at rest ADL  See FIM Vision/Perception  Vision- History Baseline Vision/History: Wears glasses Wears Glasses: Reading only Patient Visual Report: No change from baseline  Cognition Overall Cognitive Status: Within Functional Limits for tasks assessed Arousal/Alertness: Awake/alert Orientation Level: Oriented X4 Selective Attention: Appears intact Memory: Appears intact Awareness: Appears intact Problem Solving: Appears intact Safety/Judgment: Appears intact Sensation Sensation Light Touch: Appears Intact Stereognosis: Not tested Hot/Cold: Not tested Coordination Gross Motor Movements are Fluid and Coordinated: Yes Fine Motor Movements are Fluid and Coordinated: Yes Heel Shin Test: WNL in RLE; LLE excursion of movement limited by pain in lower back. Motor  Motor Motor: Within Functional Limits Motor - Discharge Observations: Trunk mobility limited by pain, precautions Mobility      Trunk/Postural Assessment  Cervical Assessment Cervical Assessment: Within Functional Limits Thoracic Assessment Thoracic Assessment: Exceptions to [facility] Thoracic AROM Overall Thoracic AROM: Deficits;Due to precautions;Due to pain Lumbar Assessment Lumbar Assessment: Exceptions to [facility] Lumbar AROM Overall Lumbar AROM: Deficits;Due to pain;Due to precautions Postural Control Postural Control: Within Functional Limits  Balance   Extremity/Trunk Assessment RUE Assessment RUE Assessment: Within Functional Limits LUE Assessment LUE Assessment: Within Functional Limits (slight shakiness in Lt hand with fine motor tasks)  See FIM for current functional status  Terriyah Westra, [facility] 03/15/2014, 10:43 AM

## 2014-03-15 NOTE — Progress Notes (Signed)
[facility] PHYSICAL MEDICINE & REHABILITATION     PROGRESS NOTE    Subjective/Complaints: No issues overnite, Mod I in room without issues, learning IV abx management Occ back spasms, no bowel or bladder issues  Review of Systems - Negative except pain in back with movement  Objective: Vital Signs: Blood pressure 123/74, pulse 67, temperature 98.2 F (36.8 C), temperature source Oral, resp. rate 19, height 6\' 6"  (1.981 m), weight 84.505 kg (186 lb 4.8 oz), SpO2 98 %. No results found. No results for input(s): WBC, HGB, HCT, PLT in the last 72 hours. No results for input(s): NA, K, CL, GLUCOSE, BUN, CREATININE, CALCIUM in the last 72 hours.  Invalid input(s): CO CBG (last 3)   Recent Labs  03/14/14 1639 03/14/14 2044 03/15/14 0648  GLUCAP 135* 90 140*    Wt Readings from Last 3 Encounters:  03/08/14 84.505 kg (186 lb 4.8 oz)  03/02/14 93.3 kg (205 lb 11 oz)  03/01/14 86.183 kg (190 lb)    Physical Exam:  Constitutional: Patient is cooperative with exam. He is oriented to person, place, and time. He appears well-developed. Appears to be more comfortable today. HENT: Tongue is midline/face is symmetrical Head: Normocephalic.  Eyes: EOM are normal.  Neck: Normal range of motion. Neck supple. No thyromegaly present.  Cardiovascular: Normal rate and regular rhythm.  Respiratory: Effort normal and breath sounds normal. No respiratory distress.  GI: Soft. Bowel sounds are normal. He exhibits no distension.  Neurological: He is alert and oriented to person, place, and time. CN exam intact. Appears to have normal insight and awareness. Memory functional. UES: grossly 5/5 proximal to distal. LES: proximally inhibited by pain, 4/5 HF,  KE, 5/5 ADF/APF. No sensory deficits. DTR's 1+ M/S: no pain with proximal leg movement and with trunk movement.  Skin: Skin is warm and dry/ no breakdown, healing incisions in BUE from multiple lipoma removal Psych: mood pleasant and  appropriate   Assessment/Plan: 1. Functional deficits secondary to septic bicerebral emboli, lumbar diskitis  Stable for D/C today F/u PCP in 1-2 weeks F/u ID 4 weeks See D/C summary See D/C instructions FIM: FIM - Bathing Bathing Steps Patient Completed: Chest, Right Arm, Left Arm, Abdomen, Front perineal area, Right upper leg, Left upper leg, Buttocks, Right lower leg (including foot), Left lower leg (including foot) Bathing: 6: Assistive device (Comment)  FIM - Upper Body Dressing/Undressing Upper body dressing/undressing steps patient completed: Thread/unthread right sleeve of pullover shirt/dresss, Thread/unthread left sleeve of pullover shirt/dress, Put head through opening of pull over shirt/dress, Pull shirt over trunk Upper body dressing/undressing: 6: More than reasonable amount of time FIM - Lower Body Dressing/Undressing Lower body dressing/undressing steps patient completed: Thread/unthread right underwear leg, Thread/unthread left underwear leg, Pull underwear up/down, Thread/unthread right pants leg, Thread/unthread left pants leg, Pull pants up/down, Don/Doff right sock, Don/Doff left sock Lower body dressing/undressing: 6: Assistive device (Comment)  FIM - Toileting Toileting steps completed by patient: Adjust clothing prior to toileting, Performs perineal hygiene, Adjust clothing after toileting Toileting Assistive Devices: Grab bar or rail for support Toileting: 6: More than reasonable amount of time  FIM - Radio producer Devices: Grab bars Toilet Transfers: 6-Assistive device: No helper, 6-To toilet/ BSC, 6-From toilet/BSC  FIM - Control and instrumentation engineer Devices: Copy: 6: Supine > Sit: No assist, 6: Bed > Chair or W/C: No assist, 6: Sit > Supine: No assist, 6: Chair or W/C > Bed: No assist  FIM - Locomotion:  Wheelchair Distance: 200 Locomotion: Wheelchair: 6: Travels 150 ft or more, turns  around, maneuvers to table, bed or toilet, negotiates 3% grade: maneuvers on rugs and over door sills independently FIM - Locomotion: Ambulation Locomotion: Ambulation Assistive Devices: Administrator Ambulation/Gait Assistance: 6: Modified independent (Device/Increase time) Locomotion: Ambulation: 6: Travels 150 ft or more independently/takes more than reasonable amount of time  Comprehension Comprehension Mode: Auditory Comprehension: 7-Follows complex conversation/direction: With no assist  Expression Expression Mode: Verbal Expression: 6-Expresses complex ideas: With extra time/assistive device  Social Interaction Social Interaction: 6-Interacts appropriately with others with medication or extra time (anti-anxiety, antidepressant).  Problem Solving Problem Solving: 7-Solves complex problems: Recognizes & self-corrects  Memory Memory: 6-More than reasonable amt of time Medical Problem List and Plan: 1. 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 DVT Prophylaxis/Anticoagulation: SCDs. Venous Doppler studies 03/06/2014 negative 3. Pain Management: Oxycodone and Robaxin as needed -encouraged him to take oxycodone prior to activity with therapy 4. ID/enterococcal bacteremia. Intravenous ampicillin and Rocephin until 04/15/2014 completing a six-week course.  -plan is for conservative care regarding his back as well unless symptoms and pathology progress.Will need ID f/u 5. Neuropsych: This patient is capable of making decisions on his own behalf. 6. Skin/Wound Care: Routine skin checks 7. Fluids/Electrolytes/Nutrition: Strict I&O's. Follow-up chemistries normal. Appetite reasonable. 8. Diabetes mellitus. Hemoglobin A1c 10.3. NovoLog 6 units 3 times a day, Lantus insulin 25 units daily at bedtime.   -fair control at present 9. Hypertension. Lisinopril 2.5 mg daily, Lopressor 25 mg twice a day.    -control pain 10.  Hyperlipidemia. Lipitor  LOS (Days) 7 A FACE TO FACE EVALUATION WAS PERFORMED  Charlett Blake 03/15/2014 7:44 AM

## 2014-03-22 ENCOUNTER — Ambulatory Visit (HOSPITAL_COMMUNITY): Payer: BC Managed Care – PPO | Attending: Cardiovascular Disease

## 2014-03-22 DIAGNOSIS — I288 Other diseases of pulmonary vessels: Secondary | ICD-10-CM | POA: Diagnosis present

## 2014-03-22 DIAGNOSIS — I429 Cardiomyopathy, unspecified: Secondary | ICD-10-CM

## 2014-03-22 DIAGNOSIS — I10 Essential (primary) hypertension: Secondary | ICD-10-CM | POA: Insufficient documentation

## 2014-03-22 DIAGNOSIS — I330 Acute and subacute infective endocarditis: Secondary | ICD-10-CM

## 2014-03-22 DIAGNOSIS — E119 Type 2 diabetes mellitus without complications: Secondary | ICD-10-CM | POA: Diagnosis not present

## 2014-03-22 DIAGNOSIS — I340 Nonrheumatic mitral (valve) insufficiency: Secondary | ICD-10-CM

## 2014-03-22 DIAGNOSIS — E785 Hyperlipidemia, unspecified: Secondary | ICD-10-CM | POA: Diagnosis not present

## 2014-03-22 NOTE — Progress Notes (Signed)
Limited 2D Echo completed. 03/22/2014

## 2014-03-29 ENCOUNTER — Telehealth: Payer: Self-pay | Admitting: Interventional Cardiology

## 2014-03-29 NOTE — Telephone Encounter (Signed)
I had Truitt Merle, NP review the patient's echo with me. Per notes from the hospital, the patient should complete his antibiotics (due 04/15/14) and then have a repeat echo done. His echo was performed on 03/22/14. Per Cecille Rubin, the vegetation noted to the MV is somewhat improved. His EF has recovered from 35% to 55-60%. I called the patient and made him aware of the results. He did not have cardiology follow up scheduled with Dr. Irish Lack. I have scheduled him to be seen on 04/04/14. He is agreeable with follow up.

## 2014-03-29 NOTE — Telephone Encounter (Signed)
New Message      Patient had echo done 03/22/14 and would like to know the results. Please give patient a call back

## 2014-04-04 ENCOUNTER — Ambulatory Visit (INDEPENDENT_AMBULATORY_CARE_PROVIDER_SITE_OTHER): Payer: BLUE CROSS/BLUE SHIELD | Admitting: Interventional Cardiology

## 2014-04-04 ENCOUNTER — Encounter: Payer: Self-pay | Admitting: Interventional Cardiology

## 2014-04-04 VITALS — BP 114/72 | HR 70 | Ht 72.0 in | Wt 188.0 lb

## 2014-04-04 DIAGNOSIS — Z8249 Family history of ischemic heart disease and other diseases of the circulatory system: Secondary | ICD-10-CM

## 2014-04-04 DIAGNOSIS — I669 Occlusion and stenosis of unspecified cerebral artery: Secondary | ICD-10-CM

## 2014-04-04 DIAGNOSIS — I10 Essential (primary) hypertension: Secondary | ICD-10-CM

## 2014-04-04 DIAGNOSIS — I76 Septic arterial embolism: Secondary | ICD-10-CM

## 2014-04-04 DIAGNOSIS — I330 Acute and subacute infective endocarditis: Secondary | ICD-10-CM

## 2014-04-04 NOTE — Progress Notes (Signed)
Patient ID: [name], male   DOB: 03-Jun-1965, 49 y.o.   MRN: [id number]  Patient ID: [name], male   DOB: 05/26/1965, 49 y.o.   MRN: [id number]     Patient ID: [name] MRN: [id number] DOB/AGE: [birth year] 49 y.o.    Referring Physician Dr. Lafonda Mosses   Reason for Consultation : Family h/o CAD  HPI: 49 y/o who has a family h/o  CAD at a young age.  He has had HTN for over 10 years.  BP is usually controlled on meds.  He has ben living in [place] for 4 years.  He works out regularly.  He does cardio 45 minutes total b/w biuke and elliptical and treadmill.  He has had knee issues.  No CP, SHOB.  He takes asthma medicine, related to allergies.  Pulse during workouts go to 135-140, up to 160.  No problems with exercise.    H ehad an echo in the past and was told he had a mildly leaky valve.  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  He is working on portion control.  He passed a stress test.  He had a thyroidectomy.    He was diagnosed with DM a few weeks ago and started medicine.    Family history includes father with CABG, bilateral CEA.  CABG at about 41.  Mother as low BP and thyroid cancer.  Sister has not had problems.  Paternal GM with stroke.    He had lipoma removal and then a skin infection.  He was given an ABx and then he continued to feel poorly.  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 He was evaluated by cardiac surgery as well. He has follow-up with them in a few weeks. Repeat echo revealed improvement of his left ventricular function and moderate mitral regurgitation. This was performed a few weeks ago. He is currently in the 6 week course to treat enterococcus in his blood.     Current Outpatient Prescriptions  Medication Sig Dispense Refill  . albuterol (PROVENTIL HFA;VENTOLIN HFA) 108 (90 BASE) MCG/ACT inhaler Inhale 1 puff into the lungs every 6 (six) hours as needed  for wheezing or shortness of breath.    Marland Kitchen ampicillin 2 g in sodium chloride 0.9 % 50 mL Inject 2 g into the vein every 4 (four) hours. 36 drop 0  . aspirin EC 81 MG tablet Take 81 mg by mouth daily.    Marland Kitchen atorvastatin (LIPITOR) 10 MG tablet Take 1 tablet (10 mg total) by mouth daily. 30 tablet 1  . cefTRIAXone (ROCEPHIN) 40 MG/ML IVPB Inject 50 mLs (2 g total) into the vein every 12 (twelve) hours. 50 mL 0  . lisinopril (PRINIVIL,ZESTRIL) 2.5 MG tablet Take 1 tablet (2.5 mg total) by mouth daily. 30 tablet 1  . metFORMIN (GLUCOPHAGE) 1000 MG tablet Take 1 tablet (1,000 mg total) by mouth 2 (two) times daily with a meal. (Patient taking differently: Take 500 mg by mouth 2 (two) times daily with a meal. ) 60 tablet 0  . methocarbamol (ROBAXIN) 500 MG tablet Take 1 tablet (500 mg total) by mouth every 6 (six) hours as needed for muscle spasms. 90 tablet 0  . metoprolol tartrate (LOPRESSOR) 25 MG tablet Take 1 tablet (25 mg total) by mouth 2 (two) times daily. (Patient taking differently: Take 25 mg by mouth daily. ) 60 tablet 0  . mometasone (ASMANEX) 220  MCG/INH inhaler Inhale 1 puff into the lungs daily.    Marland Kitchen oxyCODONE 10 MG TABS Take 1 tablet (10 mg total) by mouth every 4 (four) hours as needed for moderate pain or severe pain. 90 tablet 0   No current facility-administered medications for this visit.   Past Medical History  Diagnosis Date  . Diabetes mellitus without complication     type 2  . Hyperlipidemia   . Allergic rhinitis   . lipoma   . H/O scoliosis   . Hypertension     Diagnostic exercise tolerance test assessment:04/30/2010 : comments normal -no evidence os ischemia by ST analysis  . Solitary kidney   . Wilm's tumor (nephroblastoma)     Family History  Problem Relation Age of Onset  . Heart disease Father     History   Social History  . Marital Status: Married    Spouse Name: N/A    Number of Children: N/A  . Years of Education: N/A   Occupational History  .  executive    Social History Main Topics  . Smoking status: Never Smoker   . Smokeless tobacco: Never Used  . Alcohol Use: No  . Drug Use: No  . Sexual Activity: Not on file   Other Topics Concern  . Not on file   Social History Narrative   Never smoked    Alcohol yes, rare ,1-2 per week   No recreational drugs   Occupation-  Geneticist, molecular and other executive courses   Marital status - married     Children 1 boy          Past Surgical History  Procedure Laterality Date  . Lipoma excision    . Total nephrectomy Left   . Thyroid surgery    . Knee surgery        (Not in a hospital admission)  Review of systems complete and found to be negative unless listed above .  No nausea, vomiting.  No fever chills, No focal weakness,  No palpitations.  Physical Exam: Filed Vitals:   04/04/14 1005  BP: 114/72  Pulse: 70    Weight: 188 lb (85.276 kg)  Physical exam:  [place]/AT EOMI No JVD, No carotid bruit RRR S1S2  No wheezing Soft. NT, nondistended No edema.  Bilateral lipomas.; back assymmetric; left side without mucle No focal motor or sensory deficits Normal affect  Labs:   Lab Results  Component Value Date   WBC 6.8 03/09/2014   HGB 12.3* 03/09/2014   HCT 37.0* 03/09/2014   MCV 91.1 03/09/2014   PLT 145* 03/09/2014   No results for input(s): NA, K, CL, CO2, BUN, CREATININE, CALCIUM, PROT, BILITOT, ALKPHOS, ALT, AST, GLUCOSE in the last 168 hours.  Invalid input(s): LABALBU No results found for: CKTOTAL  Lab Results  Component Value Date   CHOL 116 03/05/2014   CHOL 139 04/27/2009   CHOL 128 08/15/2008   Lab Results  Component Value Date   HDL 21* 03/05/2014   HDL 45.10 04/27/2009   HDL 36.90* 08/15/2008   Lab Results  Component Value Date   LDLCALC 71 03/05/2014   LDLCALC 80 04/27/2009   LDLCALC 74 08/15/2008   Lab Results  Component Value Date   TRIG 122 03/05/2014   TRIG 69.0 04/27/2009   TRIG 88.0 08/15/2008   Lab Results    Component Value Date   CHOLHDL 5.5 03/05/2014   CHOLHDL 3 04/27/2009   CHOLHDL 3 08/15/2008   No  results found for: LDLDIRECT     EKG: Normal  ASSESSMENT AND PLAN:   Mitral valve endocarditis: Continue current plan for 6 week course of antibiotics. We'll plan for echocardiogram in about 2 weeks. This will occur prior to his appointment with the CT surgeon. At this point, no evidence of congestive heart failure. No evidence of myocardial ischemia. He can increase his activity slowly. He had some questions about the antibiotic infusion. I asked him to contact the home health company that set him up with the equipment. He will also discuss this with his infectious disease doctor.  Family h/o CAD: Several family members with cardiovascular disease. Stressed the importance of preventive therapy including blood pressure and lipid control. He exercises regularly.  Continue aggressive diabetes control.  Blood sugar spiked during his infection. It is now under much better control.  HTN: Controlled. I encouraged him to get this checked outside of the doctor's office at least a couple times a week, once things from the endocarditis get back to normal. If his blood pressure is elevated, he should let us know. If it is normal, continue current medicines.   Hyperlipidemia:  Lipids reviewed and well controlled. LDL 73. HDL 49 in December 2014. Continue atorvastatin.   We'll have to obtain most recent results.                                                                                                                                              Signed:   Mina Marble, MD, [facility] 04/04/2014, 10:20 AM

## 2014-04-04 NOTE — Patient Instructions (Signed)
Your physician recommends that you schedule a follow-up appointment in:  3 months with Dr. Irish Lack  Your physician has requested that you have an echocardiogram. Echocardiography is a painless test that uses sound waves to create images of your heart. It provides your doctor with information about the size and shape of your heart and how well your heart's chambers and valves are working. This procedure takes approximately one hour. There are no restrictions for this procedure. To be done on April 18, 2014.

## 2014-04-06 ENCOUNTER — Ambulatory Visit (INDEPENDENT_AMBULATORY_CARE_PROVIDER_SITE_OTHER): Payer: BLUE CROSS/BLUE SHIELD | Admitting: Infectious Diseases

## 2014-04-06 ENCOUNTER — Encounter: Payer: Self-pay | Admitting: Infectious Diseases

## 2014-04-06 VITALS — BP 138/82 | HR 65 | Temp 97.5°F | Wt 188.0 lb

## 2014-04-06 DIAGNOSIS — E1165 Type 2 diabetes mellitus with hyperglycemia: Secondary | ICD-10-CM | POA: Diagnosis not present

## 2014-04-06 DIAGNOSIS — I638 Other cerebral infarction: Secondary | ICD-10-CM

## 2014-04-06 DIAGNOSIS — R7881 Bacteremia: Secondary | ICD-10-CM

## 2014-04-06 DIAGNOSIS — B952 Enterococcus as the cause of diseases classified elsewhere: Secondary | ICD-10-CM

## 2014-04-06 DIAGNOSIS — I330 Acute and subacute infective endocarditis: Secondary | ICD-10-CM

## 2014-04-06 DIAGNOSIS — I6389 Other cerebral infarction: Secondary | ICD-10-CM

## 2014-04-06 DIAGNOSIS — IMO0002 Reserved for concepts with insufficient information to code with codable children: Secondary | ICD-10-CM

## 2014-04-06 DIAGNOSIS — M4626 Osteomyelitis of vertebra, lumbar region: Secondary | ICD-10-CM | POA: Diagnosis not present

## 2014-04-06 NOTE — Assessment & Plan Note (Signed)
Will get f/u MRI.

## 2014-04-06 NOTE — Assessment & Plan Note (Signed)
He appears to be doing well. Will finish amp/ceftriaxone, pull PIC on 1-14.  Will see him back on 1-21. Will repeat his BCx at that time.  Given his continued issues with his back, would be worthwhile to repeat his MRI of his spine, abscess, osteo.

## 2014-04-06 NOTE — Assessment & Plan Note (Signed)
Not clear from Dr Clydene Fake notes if he had seizure or he had rigors. Will have him seen by neuro in f/u to determine when he can start driving again.

## 2014-04-06 NOTE — Progress Notes (Signed)
Subjective:    Patient ID: [name], male    DOB: [birth year], 49 y.o.   MRN: [id number]  HPI 49 y.o. male with poorly controlled DM, HLD, wilm's tumor and solitary kidney, hx of thyroid nodules, lipomatosis with nodules removed, admitted on 11/30 for severe back pain but also found to have rigors, high fever of rectal temp of 105. He works for [employer] and often travels [place] for work. He recently moved back from [place] this summer He had intermittent drenching nightsweats that possibly started 5-6 wk ago but have been more consistently at night for the past 7- 10 days. He thinks that these episodes started before his 2 wk travel that started on Oct 30th to [place] (transit thru [place]), where he had one day of n/v which he attributes to bad food. He then went to [place] ([place]) for 1 week and still had intermittent nightsweats during his trip. He returned on Nov 13th, and had 1 day of diarrhea that self resolved, but still felt weak from these illnesses. He had ~ 15 lb unintentional weight loss.   Prior to his trips overseas, he underwent excision of 20 lipomas on extremities and trunk where he has had previous excisions in the past on 12/09/13. 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 On follow up exam at surgeons office, it appears some nodules were warm and inflammed concerning for infection, where he was given a 7 day course of antibiotics. He states that a few incisions had intermittent drainage. He then followed up with his PCP who gave him a 7 day course of levofloxacin on 10/19. Patient doesn't know if his nightsweats resolved during the time he was on antibiotics  In hospital he was started on vanco, had fevers, developed seizures, and was found to have BCx+ for enterococcus (pans-sens) and was treated with amp/ceftriaxone with a plan for 6 weeks. Started on amp/ceftriaxone 12-8. He was found to have MV IE as well as MRI  showing:Multiple small areas of acute infarct bilaterally, consistent with cerebral emboli. Chronic microhemorrhage in the left external capsule. Question hypertension .  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  F/U TTE- 03-22-14:  - Left ventricle: The cavity size was normal. Wall thickness was normal. Systolic function was normal. The estimated ejection fraction was in the range of 55% to 60%. - Mitral valve: Myxomatous mitral valve with mild prolapse Small   mobile vegetation on posterior leaflet. Compared to 03/06/14   degree of MR remains moderate. The previous vegetation appeared larger and more sessile and broad based compared to present study. - Atrial septum: No defect or patent foramen ovale was identified. Was d/c to rehab for 1 week. Went home on 03-15-14. Has had CV f/u.  Still having back pain.  Had PT today which made his pain go to 3/10.  No problems with PIC line.  FSG have been ~ 100. Is no longer on isulin. Taking metformin and gliptin.   Review of Systems  Constitutional: Negative for fever, chills and appetite change.  Gastrointestinal: Negative for diarrhea and constipation.  Genitourinary: Negative for difficulty urinating.       Objective:   Physical Exam  Constitutional: He appears well-developed and well-nourished.  HENT:  Mouth/Throat: No oropharyngeal exudate.  Eyes: EOM are normal. Pupils are equal, round, and reactive to light.  Neck: Neck supple.  Cardiovascular: Normal rate, regular rhythm and normal heart sounds.   Pulmonary/Chest: Effort normal and breath sounds normal.  Abdominal: Soft.  Bowel sounds are normal. He exhibits no distension. There is no tenderness.  Musculoskeletal:       Arms: Lymphadenopathy:    He has no cervical adenopathy.          Assessment & Plan:

## 2014-04-06 NOTE — Assessment & Plan Note (Signed)
He appears to be much better controlled.

## 2014-04-06 NOTE — Assessment & Plan Note (Signed)
He is doing well, has CVTS f/u. Will have repeat TTE next week.  Will repeat his BCx at f/u.

## 2014-04-11 ENCOUNTER — Telehealth: Payer: Self-pay | Admitting: Neurology

## 2014-04-11 ENCOUNTER — Telehealth: Payer: Self-pay | Admitting: *Deleted

## 2014-04-11 NOTE — Telephone Encounter (Addendum)
Called the patient and left the information on his voice mail about the MRI appt. He is set to have the MRI at [facility] Radiology department on 04/25/14 at 5 pm. Advised him to call the office if he is not able to make that appt. It is the earliest they have a [facility] or [place] [facility].  Insurance Auth #[id number] good 04/07/14-05/06/14

## 2014-04-11 NOTE — Telephone Encounter (Signed)
Called Dr Shana Chute office back and left message for a return call.

## 2014-04-11 NOTE — Telephone Encounter (Signed)
[place] with Dr. Shana Chute office @ [phone].  Needing clarification if patient has driving restrictions from being seen in hospital by Dr. Leonie Man.  According to Dr. Clydene Fake notes wasn't sure if patient had Stroke or Rigors, per Dr. Johnnye Sima.  Please call and advise.

## 2014-04-12 ENCOUNTER — Telehealth: Payer: Self-pay | Admitting: *Deleted

## 2014-04-12 NOTE — Telephone Encounter (Signed)
Dr Leonie Man are there any driving restrictions for this patient, Discharge summary for 03/15/14 doesn't say anything about patient's driving privileges. Please advise.

## 2014-04-12 NOTE — Telephone Encounter (Signed)
Pt contacted about setting up referral appt - declined appt.  Pt shared that he was told he did not need the referral appt.  [facility] wanting to know if Dr. Johnnye Sima wants to pt to go forward with the appt.  MD please advise.

## 2014-04-12 NOTE — Telephone Encounter (Signed)
Pt needs non-ID f/u to be cleared for driving due to his question of seizure in the hospital.

## 2014-04-13 ENCOUNTER — Ambulatory Visit: Payer: BC Managed Care – PPO | Admitting: Cardiothoracic Surgery

## 2014-04-13 NOTE — Telephone Encounter (Signed)
Pt is calling back requesting someone call him regarding his driving restrictions.  He states he needs to know as soon as possible because he is going back to work and he needs to drive to get there.  Please advise.

## 2014-04-13 NOTE — Telephone Encounter (Signed)
Please see note addendum from 04/11/14 patient notified and request a call from doctor about need for visit. He did not have restriction on driving.

## 2014-04-13 NOTE — Telephone Encounter (Signed)
Per note from Dr Johnnye Sima called the patient and rescheduled the patient for 04/27/14 for after his MRI. The patient declined the neuro appt and advised he was never given any restrictions on driving. Advised him Dr Johnnye Sima wanted him to see Neuro. The patient advised he does not know why and would like for the doctor to call him and explain the need. Advised him will let the doctor know he want a call but that it is simply a follow up to a condition that required a hospital stay and he needs to be checked.

## 2014-04-13 NOTE — Telephone Encounter (Signed)
Called patient`s listed number no reply and unable to leave message. Neurologically no reason not to drive. We have not seen him in office for f/u yet though.

## 2014-04-14 ENCOUNTER — Ambulatory Visit: Payer: BC Managed Care – PPO | Admitting: Skilled Nursing Facility1

## 2014-04-14 NOTE — Telephone Encounter (Signed)
Spoke with patient and informed of Dr Clydene Fake message that from Neurologic standpoint no restrictions on driving. Patient would like to know if he will need to be seen by you, per EMR there was no instructions for patient to follow up here, please advise

## 2014-04-17 ENCOUNTER — Telehealth: Payer: Self-pay | Admitting: *Deleted

## 2014-04-17 NOTE — Telephone Encounter (Signed)
Pt requesting note to "return-to-work."  Pt would appreciate noting any restrictions.  Most interested about when he may return to travel/fly to [place].  His employment requires travel.  He does want to be "conservative" when returning to work and travel.

## 2014-04-17 NOTE — Telephone Encounter (Signed)
Patient wanting to know if he will need to be seen by you, please advise

## 2014-04-18 ENCOUNTER — Ambulatory Visit (HOSPITAL_COMMUNITY): Payer: BLUE CROSS/BLUE SHIELD | Attending: Cardiology | Admitting: Cardiology

## 2014-04-18 DIAGNOSIS — I330 Acute and subacute infective endocarditis: Secondary | ICD-10-CM | POA: Diagnosis present

## 2014-04-18 NOTE — Progress Notes (Deleted)
Echo performed. 

## 2014-04-18 NOTE — Progress Notes (Signed)
Limited echo performed. 

## 2014-04-19 ENCOUNTER — Other Ambulatory Visit: Payer: Self-pay | Admitting: Cardiothoracic Surgery

## 2014-04-19 ENCOUNTER — Encounter: Payer: Self-pay | Admitting: Infectious Diseases

## 2014-04-19 DIAGNOSIS — I330 Acute and subacute infective endocarditis: Secondary | ICD-10-CM

## 2014-04-19 NOTE — Telephone Encounter (Signed)
Done. In box

## 2014-04-20 ENCOUNTER — Ambulatory Visit
Admission: RE | Admit: 2014-04-20 | Discharge: 2014-04-20 | Disposition: A | Discharge status: Home / Self Care | Payer: BLUE CROSS/BLUE SHIELD | Source: Ambulatory Visit | Attending: Cardiothoracic Surgery | Admitting: Cardiothoracic Surgery

## 2014-04-20 ENCOUNTER — Encounter: Payer: Self-pay | Admitting: Cardiothoracic Surgery

## 2014-04-20 ENCOUNTER — Ambulatory Visit (INDEPENDENT_AMBULATORY_CARE_PROVIDER_SITE_OTHER): Payer: BLUE CROSS/BLUE SHIELD | Admitting: Cardiothoracic Surgery

## 2014-04-20 ENCOUNTER — Ambulatory Visit: Payer: BLUE CROSS/BLUE SHIELD | Admitting: Infectious Diseases

## 2014-04-20 VITALS — BP 143/88 | HR 92 | Resp 20 | Ht 72.0 in | Wt 188.0 lb

## 2014-04-20 DIAGNOSIS — I38 Endocarditis, valve unspecified: Secondary | ICD-10-CM

## 2014-04-20 DIAGNOSIS — I330 Acute and subacute infective endocarditis: Secondary | ICD-10-CM

## 2014-04-20 NOTE — Progress Notes (Signed)
[address]       [place] [address]             [phone]                    [name] [place] Medical Record #[id number] Date of Birth: Aug 22, 1965  Referring: Orpah Melter, MD Primary Care: Orpah Melter, MD Cardiology : Dr Irish Lack  Chief Complaint:    Chief Complaint  Patient presents with  . Endocarditis    4 week f/u from Hospital consult wtih CXR, mitral valve endocarditis     History of Present Illness:    [name] 49 y.o. male is seen in the office  today for   Patient is a 49 year old male with previous history of hypertension diabetes and positive family history for coronary artery disease. He has no history of valvular disease . In mid-September he had multiple lump palm was removed from his arms and legs , some of these infections became infected and he was started on a course of antibiotics the most recent mid-October for 10 days . He then traveled both to [place] and [place] for work and return November 14 . He had noted while traveling being tired and having occasional night fevers and sweats . He continued to feel tired in 1 week ago saw his primary care doctor and was noted to have a markedly elevated glucose . On November 20 he was noted to wasn't long hospital with severe back spasm . MRI of the spine was performed . Then over night he became tachycardic noted he was not feeling well, BC returned positive for gram positive cocci in chains and placed on ABX Temperature was noted to raise to 103.6. The patient was brought to CT scan where he was noted to have a peroid of arms flexed and mild tremor--there was question of riggers versus seizure. On the floor he had another episode of generalized tremulous activity and "teeth chattering" which again was possibly riggers versus seizure. During this time he was not alert and only moaning while moving from bed to bed. He was intubated due to airway compromise. MRI showed embolic shower.  Patient was transferred to [place] after TEE suggested mitral valve endocarditis.  His mental status improved and ultimaly was transferred to inpatient rehab then home. He has now completed course  of antibiotics. He denies fever and chills. He has had no SOB, DOE or other symptoms of CHF. He has now reestablished on going care with cardiology. Still has some back pain but much improved. ID has arranged for follow up MRI  Repeat echo have been done early this week and in late december    Current Activity/ Functional Status:  Patient is independent with mobility/ambulation, transfers, ADL's, IADL's.   Zubrod Score: At the time of surgery this patient's most appropriate activity status/level should be described as: [x]     0    Normal activity, no symptoms []     1    Restricted in physical strenuous activity but ambulatory, able to do out light work []     2    Ambulatory and capable of self care, unable to do work activities, up and about               >50 % of waking hours                              []   3    Only limited self care, in bed greater than 50% of waking hours []     4    Completely disabled, no self care, confined to bed or chair []     5    Moribund   Past Medical History  Diagnosis Date  . Diabetes mellitus without complication     type 2  . Hyperlipidemia   . Allergic rhinitis   . lipoma   . H/O scoliosis   . Hypertension     Diagnostic exercise tolerance test assessment:04/30/2010 : comments normal -no evidence os ischemia by ST analysis  . Solitary kidney   . Wilm's tumor (nephroblastoma)     Past Surgical History  Procedure Laterality Date  . Lipoma excision    . Total nephrectomy Left   . Thyroid surgery    . Knee surgery      Family History  Problem Relation Age of Onset  . Heart disease Father     History   Social History  . Marital Status: Married    Spouse Name: N/A    Number of Children: N/A  . Years of Education: N/A   Occupational  History  . executive    Social History Main Topics  . Smoking status: Never Smoker   . Smokeless tobacco: Never Used  . Alcohol Use: No  . Drug Use: No  . Sexual Activity: Not on file   Other Topics Concern  . Not on file   Social History Narrative   Never smoked    Alcohol yes, rare ,1-2 per week   No recreational drugs   Occupation-  Geneticist, molecular and other executive courses   Marital status - married     Children 1 boy          History  Smoking status  . Never Smoker   Smokeless tobacco  . Never Used    History  Alcohol Use No     No Known Allergies  Current Outpatient Prescriptions  Medication Sig Dispense Refill  . albuterol (PROVENTIL HFA;VENTOLIN HFA) 108 (90 BASE) MCG/ACT inhaler Inhale 1 puff into the lungs every 6 (six) hours as needed for wheezing or shortness of breath.    Marland Kitchen aspirin EC 81 MG tablet Take 81 mg by mouth daily.    Marland Kitchen atorvastatin (LIPITOR) 10 MG tablet Take 1 tablet (10 mg total) by mouth daily. 30 tablet 1  . hydrochlorothiazide (HYDRODIURIL) 25 MG tablet     . lisinopril (PRINIVIL,ZESTRIL) 2.5 MG tablet Take 1 tablet (2.5 mg total) by mouth daily. 30 tablet 1  . metFORMIN (GLUCOPHAGE) 1000 MG tablet Take 1 tablet (1,000 mg total) by mouth 2 (two) times daily with a meal. (Patient taking differently: Take 500 mg by mouth 2 (two) times daily with a meal. ) 60 tablet 0  . methocarbamol (ROBAXIN) 500 MG tablet Take 1 tablet (500 mg total) by mouth every 6 (six) hours as needed for muscle spasms. (Patient not taking: Reported on 04/20/2014) 90 tablet 0  . metoprolol tartrate (LOPRESSOR) 25 MG tablet Take 1 tablet (25 mg total) by mouth 2 (two) times daily. (Patient taking differently: Take 25 mg by mouth daily. ) 60 tablet 0  . mometasone (ASMANEX) 220 MCG/INH inhaler Inhale 1 puff into the lungs daily.    Marland Kitchen oxyCODONE 10 MG TABS Take 1 tablet (10 mg total) by mouth every 4 (four) hours as needed for moderate pain or severe pain.  (Patient not taking: Reported on  04/20/2014) 90 tablet 0   No current facility-administered medications for this visit.     Review of Systems:     Cardiac Review of Systems: Y or N  Chest Pain [  n  ]  Resting SOB [ n  ] Exertional SOB  [ n ]  Orthopnea [ n ]   Pedal Edema [ n  ]    Palpitations [ n ] Syncope  [ n ]   Presyncope [ n  ]  General Review of Systems: [Y] = yes [  ]=no Constitional: recent weight change [ y ];  Wt loss over the last 3 months [   ] anorexia [  ]; fatigue [  ]; nausea [  ]; night sweats [  ]; fever [  ]; or chills [  ];          Dental: poor dentition[  ]; Last Dentist visit:   Eye : blurred vision [  ]; diplopia [   ]; vision changes [  ];  Amaurosis fugax[  ]; Resp: cough [n  ];  wheezing[ n ];  hemoptysis[n  ]; shortness of breath[ n ]; paroxysmal nocturnal dyspnea[  n]; dyspnea on exertion[  ]; or orthopnea[  ];  GI:  gallstones[  ], vomiting[  ];  dysphagia[  ]; melena[ n ];  hematochezia [ n ]; heartburn[  ];   Hx of  Colonoscopy[  ]; GU: kidney stones [  ]; hematuria[n  ];   dysuria [n  ];  nocturia[  ];  history of     obstruction [n  ]; urinary frequency [ n ]             Skin: rash, swelling[  ];, hair loss[  ];  peripheral edema[  ];  or itching[  ]; Musculosketetal: myalgias[ n ];  joint swelling[  ];  joint erythema[n  ];  joint pain[  ];  back pain[ y ];  Heme/Lymph: bruising[  ];  bleeding[  ];  anemia[  ];  Neuro: TIA[  ];  headaches[  ];  stroke[  ];  vertigo[  ];  seizures[  ];   paresthesias[  ];  difficulty walking[  ];  Psych:depression[  ]; anxiety[  ];  Endocrine: diabetes[ y ];  thyroid dysfunction[  ];  Immunizations: Flu up to date Blue.Reese  ]; Pneumococcal up to date [ y ];  Other:  Physical Exam: BP 143/88 mmHg  Pulse 92  Resp 20  Ht 6' (1.829 m)  Wt 188 lb (85.276 kg)  BMI 25.49 kg/m2  SpO2 98%  PHYSICAL EXAMINATION:  General appearance: alert, cooperative and appears stated age Neurologic: intact Heart: regular rate and rhythm,  S1, S2 normal, 2/6 systolic murmur radiating to axilla , click, rub or gallop Lungs: clear to auscultation bilaterally Abdomen: soft, non-tender; bowel sounds normal; no masses,  no organomegaly Extremities: extremities normal, atraumatic, no cyanosis or edema and Homans sign is negative, no sign of DVT no cervical adenopathy Full dp/ pt pulses- No skin changes suggestive of emboli   Diagnostic Studies & Laboratory data:     Recent Radiology Findings:   Dg Chest 2 View  04/20/2014   CLINICAL DATA:  Acute bacterial endocarditis  EXAM: CHEST  2 VIEW  COMPARISON:  03/04/2014  FINDINGS: Heart size normal. Negative for heart failure. Aortic arch is normal. PICC has been removed since the prior study  Negative for pneumonia .  Lungs are clear.  No effusion  Gas density  under the left hemidiaphragm anteriorly consistent with colon. Lumbar kyphosis noted.  IMPRESSION: No active cardiopulmonary disease.   Electronically Signed   By: Franchot Gallo M.D.   On: 04/20/2014 10:09    Repeat ECHO: 04/18/2014 Study Conclusions  - Left ventricle: The cavity size was normal. Wall thickness was normal. Systolic function was normal. The estimated ejection fraction was in the range of 50% to 55%. Wall motion was normal; there were no regional wall motion abnormalities. - Mitral valve: Mild prolapse, involving the posterior leaflet. There was moderate regurgitation.  Impressions:  - Vegetation on posterior leaflet is no longer present. Prior ECHO reviewed. Mitral regurgitation remains moderate.   Recent Lab Findings: Lab Results  Component Value Date   WBC 6.8 03/09/2014   HGB 12.3* 03/09/2014   HCT 37.0* 03/09/2014   PLT 145* 03/09/2014   GLUCOSE 123* 03/09/2014   CHOL 116 03/05/2014   TRIG 122 03/05/2014   HDL 21* 03/05/2014   LDLCALC 71 03/05/2014   ALT 16 03/09/2014   AST 15 03/09/2014   NA 132* 03/09/2014   K 4.2 03/09/2014   CL 94* 03/09/2014   CREATININE 0.86 03/09/2014   BUN  14 03/09/2014   CO2 26 03/09/2014   TSH 0.549 Test methodology is 3rd generation TSH 01/02/2009   HGBA1C 10.3* 02/27/2014   I have independently    reviewed  The echo and viewed the images with the patient    Assessment / Plan:   1/ History of mitral valve endocarditis with  cleared mental status changes, with resolution of vegetations with treatment, still has asymptotic MR - moderate- I discussed with the patient and his wife that there is current no indication to proceed with surgical repair replacement of Mitral. He will need close cardiology follow up to monitor degree of MR over time with serial echos 2/ Diabetes 3/ Lumbar Discitis  Follow up appointment in 4 months     I spent 40 minutes counseling the patient face to face. The total time spent in the appointment was 60 minutes.  Grace Isaac MD      [address] [place] [address] Office [phone]   Beeper [phone]  04/20/2014 11:06 AM

## 2014-04-24 ENCOUNTER — Telehealth: Payer: Self-pay | Admitting: *Deleted

## 2014-04-24 ENCOUNTER — Telehealth: Payer: Self-pay | Admitting: Neurology

## 2014-04-24 ENCOUNTER — Inpatient Hospital Stay: Payer: BC Managed Care – PPO | Admitting: Physical Medicine & Rehabilitation

## 2014-04-24 NOTE — Telephone Encounter (Signed)
Patient is calling to see if he needs to see Dr. Leonie Man per instructions from Dr Johnnye Sima.  He was assuming he did not need to see him again but is confused by Dr. Algis Downs instruction.  Please advise.

## 2014-04-24 NOTE — Telephone Encounter (Signed)
Call from [place] with [facility] [phone] ext [phone]; patient is refusing referral. Please advise if referral to Neurology can be cancelled. Patient was suppose to be evaluated for possible seizure. Dr. Johnnye Sima has provided patient with a return to note letter. Next appointment here is 04/27/14. Myrtis Hopping

## 2014-04-25 ENCOUNTER — Other Ambulatory Visit: Payer: Self-pay | Admitting: Infectious Diseases

## 2014-04-25 ENCOUNTER — Ambulatory Visit (HOSPITAL_COMMUNITY)
Admission: RE | Admit: 2014-04-25 | Discharge: 2014-04-25 | Disposition: A | Discharge status: Home / Self Care | Payer: BLUE CROSS/BLUE SHIELD | Source: Ambulatory Visit | Attending: Infectious Diseases | Admitting: Infectious Diseases

## 2014-04-25 DIAGNOSIS — M545 Low back pain: Secondary | ICD-10-CM | POA: Diagnosis present

## 2014-04-25 DIAGNOSIS — M4626 Osteomyelitis of vertebra, lumbar region: Secondary | ICD-10-CM

## 2014-04-25 DIAGNOSIS — M479 Spondylosis, unspecified: Secondary | ICD-10-CM | POA: Diagnosis not present

## 2014-04-25 DIAGNOSIS — M4646 Discitis, unspecified, lumbar region: Secondary | ICD-10-CM | POA: Insufficient documentation

## 2014-04-25 MED ORDER — GADOBENATE DIMEGLUMINE 529 MG/ML IV SOLN
18.0000 mL | Freq: Once | INTRAVENOUS | Status: AC | PRN
  Administered 2014-04-25: 18 mL via INTRAVENOUS

## 2014-04-25 NOTE — Telephone Encounter (Signed)
Spoke with patient's wife and she states that patient has had numerous calls between Dr Clydene Fake office and Dr Algis Downs office, informed her that only time that we have communication was on 04/11/14 when [facility] from Dr Johnnye Sima asking if patient had any driving restriction, see phone note 04/11/14. Wife states that the question was not even about him driving it was about whether he should be seen by neurology or not, per phone note 04/06/14 from Dr Johnnye Sima he requested that patient be seen by a neurologist, wife was instructed to speak with Dr Johnnye Sima when he goes for his appointment on Thursday so he can decide if he would like for patient to be seen with Dr Leonie Man.

## 2014-04-26 ENCOUNTER — Other Ambulatory Visit: Payer: Self-pay | Admitting: *Deleted

## 2014-04-26 ENCOUNTER — Telehealth: Payer: Self-pay | Admitting: *Deleted

## 2014-04-26 DIAGNOSIS — I330 Acute and subacute infective endocarditis: Secondary | ICD-10-CM

## 2014-04-26 NOTE — Telephone Encounter (Signed)
Patient called concerned because his follow up appt has been rescheduled and he wants to know if he can come in anyway and have the Blood cultures he was expecting to have done done. Advised he was scheduled to 05/08/14 and will keep that appt but wants to get in to have labs now. Advised him pretty sure it will not be a problem but will have to call the doctor and ask and give him a call back asap.  Spoke with Dr Johnnye Sima and it is ok for the patient to come for labs prior to the visit.

## 2014-04-27 ENCOUNTER — Ambulatory Visit: Payer: BLUE CROSS/BLUE SHIELD | Admitting: Infectious Diseases

## 2014-04-27 ENCOUNTER — Other Ambulatory Visit: Payer: BLUE CROSS/BLUE SHIELD

## 2014-04-27 DIAGNOSIS — I330 Acute and subacute infective endocarditis: Secondary | ICD-10-CM

## 2014-05-01 ENCOUNTER — Telehealth: Payer: Self-pay | Admitting: Interventional Cardiology

## 2014-05-01 NOTE — Telephone Encounter (Signed)
New Message     Patient is having some tightness in his upper left chest by the collar bone and its not going away. He needs to schedule a sooner appt with Dr. Irish Lack. I was going to schedule him with a PA/NP but he would like to see the Dr..  Please give patient a call.

## 2014-05-01 NOTE — Telephone Encounter (Signed)
I agree that this is not likely cardiac pain.  Continue to monitor.

## 2014-05-01 NOTE — Telephone Encounter (Signed)
The patient is aware of Dr. Varanasi's recommendations.  

## 2014-05-01 NOTE — Telephone Encounter (Signed)
I spoke with the patient. He states that for the last several days, he has had episodes of a "tight" feeling in the left upper chest area, around the clavicle area. He states this is not a constant discomfort. He mostly will notice it when he is sitting and relaxing. He denies SOB/ fevers. He reports that his BP/ HR are good and that he is exercising without difficulty. I advised him that I did not think this was coming from his heart, but with his history, I would forward to Dr. Irish Lack for review and recommendations. He voices understanding.

## 2014-05-03 LAB — CULTURE, BLOOD (SINGLE)
Organism ID, Bacteria: NO GROWTH
Organism ID, Bacteria: NO GROWTH

## 2014-05-08 ENCOUNTER — Ambulatory Visit (INDEPENDENT_AMBULATORY_CARE_PROVIDER_SITE_OTHER): Payer: BLUE CROSS/BLUE SHIELD | Admitting: Infectious Diseases

## 2014-05-08 ENCOUNTER — Encounter: Payer: Self-pay | Admitting: Infectious Diseases

## 2014-05-08 VITALS — BP 144/83 | HR 67 | Temp 97.7°F | Wt 188.0 lb

## 2014-05-08 DIAGNOSIS — M4626 Osteomyelitis of vertebra, lumbar region: Secondary | ICD-10-CM

## 2014-05-08 LAB — C-REACTIVE PROTEIN: CRP: <0.5 mg/dL (ref <0.60)

## 2014-05-08 LAB — SEDIMENTATION RATE: SED RATE: 1 mm/h (ref 0–15)

## 2014-05-08 MED ORDER — AMOXICILLIN-POT CLAVULANATE 875-125 MG PO TABS: 1.0000 | ORAL_TABLET | Freq: Two times a day (BID) | ORAL | Status: DC

## 2014-05-08 NOTE — Progress Notes (Signed)
Subjective:    Patient ID: [name], male    DOB: Nov 08, 1965, 49 y.o.   MRN: [id number]  HPI 49 y.o. male with poorly controlled DM, HLD, wilm's tumor and solitary kidney, hx of thyroid nodules, lipomatosis with nodules removed, admitted on 11/30 for severe back pain but also found to have rigors, temp of 105. He works for [employer] and often travels [place] for work. He recently moved back from [place] this summer He had intermittent drenching nightsweats that possibly started 5-6 wk ago but have been more consistently at night for the past 7- 10 days. He thinks that these episodes started before his 2 wk travel that started on Oct 30th to [place] (transit thru [place]), where he had one day of n/v which he attributes to bad food. He then went to [place] ([place]) for 1 week and still had intermittent nightsweats during his trip. He returned on Nov 13th, and had 1 day of diarrhea that self resolved, but still felt weak from these illnesses. He had ~ 15 lb unintentional weight loss.   Prior to his trips overseas, he underwent excision of 20 lipomas on extremities and trunk where he has had previous excisions in the past on 12/09/13. 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 On follow up exam at surgeons office, it appears some nodules were warm and inflammed concerning for infection, where he was given a 7 day course of antibiotics. He states that a few incisions had intermittent drainage. He then followed up with his PCP who gave him a 7 day course of levofloxacin on 10/19. Patient doesn't know if his nightsweats resolved during the time he was on antibiotics  In hospital he was started on vanco, had fevers, developed seizures, and was found to have BCx+ for enterococcus (pans-sens) and was treated with amp/ceftriaxone with a plan for 6 weeks. Started on amp/ceftriaxone 12-8. He was found to have MV IE as well as MRI showing:Multiple small areas of  acute infarct bilaterally, consistent with cerebral emboli. Chronic microhemorrhage in the left external capsule. Question hypertension .  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  F/U TTE- 03-22-14: - Left ventricle: The cavity size was normal. Wall thickness was normal. Systolic function was normal. The estimated ejectionfraction was in the range of 55% to 60%. - Mitral valve: Myxomatous mitral valve with mild prolapse Small mobile vegetation on posterior leaflet. Compared to 03/06/14 degree of MR remains moderate. The previous vegetation appeared larger and more sessile andbroad based compared to present study. - Atrial septum: No defect or patent foramen ovale was identified. Was d/c to rehab for 1 week. Went home on 03-15-14. Has had CV and CVTS f/u.  Has been cleared by neuro to resume driving.  Had repeat TTE 04-18-14 which showed resolution of vegetation. Repeat BCx 1-28 (-).  He had repeat MRI of his L spine on 04-25-14 showing:  1. 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 Disc space fluid has nearly completely resolved, however there is slightly progressive disc space collapse and endplate erosion with significantly increased bone marrow edema. No abscess. 2. No evidence of new infection elsewhere in the lumbar spine. 3. Unchanged appearance of lumbar spondylosis.  Still has mild pain in his lower back. Getting back to his previous exerciser- walking. Feels more like a 1/10 soreness. Has better exercise tolerance. Back to driving. Has been off oxycodone and robaxin for > 1 month.   Review of Systems  Constitutional: Negative for fever and chills.  Objective:   Physical Exam  Constitutional: He appears well-developed and well-nourished.  Eyes: EOM are normal. Pupils are equal, round, and reactive to light.  Cardiovascular: Normal rate, regular rhythm and normal heart sounds.   Pulmonary/Chest: Effort normal and breath sounds normal.  Abdominal: Soft. Bowel sounds are normal.  He exhibits no distension. There is no tenderness.          Assessment & Plan:

## 2014-05-08 NOTE — Assessment & Plan Note (Signed)
He is doing well. I do have some concern about the changes on his MRI. Will start him on augmentin for the next 3 months.  Will check his ESR and CRP. Prev 20.2 and 56 in December.  Will see him back in 2-3 months.

## 2014-06-13 ENCOUNTER — Telehealth: Payer: Self-pay | Admitting: Interventional Cardiology

## 2014-06-13 NOTE — Telephone Encounter (Signed)
Echo does not need to be repeated before f/u with me.

## 2014-06-13 NOTE — Telephone Encounter (Signed)
New Message        Pt calling stating that he is supposed to have an echocardiogram 1 week prior to his appt w/ Dr. Irish Lack. There is no order in Epic for an Echo, please call back and advise.

## 2014-06-13 NOTE — Telephone Encounter (Signed)
Dr. Irish Lack- this patient had an echo 1/19 and saw Dr. Servando Snare on 1/21. I don't see anywhere on his echo or in Dr. Everrett Coombe note that he needs a repeat echo a week prior to his follow up with you- he sees you 4/7. Do you want this repeated prior?

## 2014-06-14 NOTE — Telephone Encounter (Signed)
Informed pt that echo did not need to be repeated before follow up with Dr. Irish Lack. Pt verbalized understanding and was in agreement with this plan.

## 2014-07-06 ENCOUNTER — Encounter: Payer: Self-pay | Admitting: Interventional Cardiology

## 2014-07-06 ENCOUNTER — Ambulatory Visit (INDEPENDENT_AMBULATORY_CARE_PROVIDER_SITE_OTHER): Payer: BLUE CROSS/BLUE SHIELD | Admitting: Interventional Cardiology

## 2014-07-06 VITALS — BP 120/70 | HR 74 | Ht 72.0 in | Wt 191.0 lb

## 2014-07-06 DIAGNOSIS — I10 Essential (primary) hypertension: Secondary | ICD-10-CM

## 2014-07-06 DIAGNOSIS — Z8249 Family history of ischemic heart disease and other diseases of the circulatory system: Secondary | ICD-10-CM | POA: Diagnosis not present

## 2014-07-06 DIAGNOSIS — I340 Nonrheumatic mitral (valve) insufficiency: Secondary | ICD-10-CM | POA: Diagnosis not present

## 2014-07-06 NOTE — Patient Instructions (Signed)
Your physician recommends that you continue on your current medications as directed. Please refer to the Current Medication list given to you today.    Your physician wants you to follow-up in: 6 months with dr Glennon Hamilton will receive a reminder letter in the mail two months in advance. If you don't receive a letter, please call our office to schedule the follow-up appointment.

## 2014-07-06 NOTE — Progress Notes (Signed)
Patient ID: [name], male   DOB: April 16, 1965, 49 y.o.   MRN: [id number]     Cardiology Office Note   Date:  07/06/2014   ID:  [name], DOB 1966/02/25, MRN [id number]  PCP:  Orpah Melter, MD    No chief complaint on file. Mitral valve disease   Wt Readings from Last 3 Encounters:  07/06/14 191 lb (86.637 kg)  05/08/14 188 lb (85.276 kg)  04/20/14 188 lb (85.276 kg)       History of Present Illness: [name] is a 49 y.o. male  who has a family h/o  CAD at a young age.  He has had HTN for over 10 years.  BP is usually controlled on meds. He had lipoma removal and then a skin infection in 2015.  He was given an ABx and then he continued to feel poorly.  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 He completed a 6 week course of antibiotics to treat enterococcus in his blood. He was evaluated by cardiac surgery as well. He had moderate mitral regurgitation noted on his echocardiogram in January 2016. He is being managed medically.  He typically traveled for work prior to his endocarditis.  He has not been traveling recently. He is back to working out regularly.  He does cardio 45 minutes total b/w bike and elliptical and treadmill. He tries to do something every other day. He has had knee issues.  No CP, SHOB.  He takes asthma medicine, related to allergies.  Pulse during workouts go to 135-140.  No problems with exercise the exception that he feels that his stamina is not quite where it was prior to his endocarditis.    He had an echo many years ago and was told he had a mildly leaky valve.  This was likely the mitral valve prolapse that was later seen at the time of his endocarditis.  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  He is working on portion control.  He passed a stress test.  He had a thyroidectomy.     Family history includes father with CABG, bilateral CEA.  CABG at about 24.   Mother as low BP and thyroid cancer.  Sister has not had problems.  Paternal GM with stroke.    Repeat echo revealed improvement of his left ventricular function and moderate mitral regurgitation. This was performed in January 2016.     Past Medical History  Diagnosis Date  . Diabetes mellitus without complication     type 2  . Hyperlipidemia   . Allergic rhinitis   . lipoma   . H/O scoliosis   . Hypertension     Diagnostic exercise tolerance test assessment:04/30/2010 : comments normal -no evidence os ischemia by ST analysis  . Solitary kidney   . Wilm's tumor (nephroblastoma)     Past Surgical History  Procedure Laterality Date  . Lipoma excision    . Total nephrectomy Left   . Thyroid surgery    . Knee surgery       Current Outpatient Prescriptions  Medication Sig Dispense Refill  . albuterol (PROVENTIL HFA;VENTOLIN HFA) 108 (90 BASE) MCG/ACT inhaler Inhale 1 puff into the lungs every 6 (six) hours as needed for wheezing or shortness of breath.    Marland Kitchen amoxicillin-clavulanate (AUGMENTIN) 875-125 MG per tablet Take 1 tablet by mouth 2 (two) times daily. 60 tablet 2  . aspirin EC  81 MG tablet Take 81 mg by mouth daily.    Marland Kitchen atorvastatin (LIPITOR) 10 MG tablet Take 1 tablet (10 mg total) by mouth daily. 30 tablet 1  . hydrochlorothiazide (HYDRODIURIL) 25 MG tablet     . lisinopril (PRINIVIL,ZESTRIL) 2.5 MG tablet Take 1 tablet (2.5 mg total) by mouth daily. 30 tablet 1  . metFORMIN (GLUCOPHAGE) 1000 MG tablet Take 1 tablet (1,000 mg total) by mouth 2 (two) times daily with a meal. (Patient taking differently: Take 500 mg by mouth 2 (two) times daily with a meal. ) 60 tablet 0  . metoprolol tartrate (LOPRESSOR) 25 MG tablet Take 1 tablet (25 mg total) by mouth 2 (two) times daily. (Patient taking differently: Take 25 mg by mouth daily. ) 60 tablet 0  . mometasone (ASMANEX) 220 MCG/INH inhaler Inhale 1 puff into the lungs daily.    . repaglinide (PRANDIN) 0.5 MG tablet Take 0.5 mg  by mouth 2 (two) times daily before a meal.     No current facility-administered medications for this visit.    Allergies:   Review of patient's allergies indicates no known allergies.    Social History:  The patient  reports that he has never smoked. He has never used smokeless tobacco. He reports that he does not drink alcohol or use illicit drugs.   Family History:  The patient's family history includes Heart disease in his father.    ROS:  Please see the history of present illness.   Otherwise, review of systems are positive for decreased stamina compared to prior.   All other systems are reviewed and negative.    PHYSICAL EXAM: VS:  BP 120/70 mmHg  Pulse 74  Ht 6' (1.829 m)  Wt 191 lb (86.637 kg)  BMI 25.90 kg/m2 , BMI Body mass index is 25.9 kg/(m^2). GEN: Well nourished, well developed, in no acute distress HEENT: normal Neck: no JVD, carotid bruits, or masses Cardiac: RRR; no murmurs, rubs, or gallops,no edema  Respiratory:  clear to auscultation bilaterally, normal work of breathing GI: soft, nontender, nondistended, + BS MS: no deformity or atrophy Skin: warm and dry, no rash Neuro:  Strength and sensation are intact Psych: euthymic mood, full affect    Recent Labs: 03/09/2014: ALT 16; BUN 14; Creatinine 0.86; Hemoglobin 12.3*; Platelets 145*; Potassium 4.2; Sodium 132*   Lipid Panel    Component Value Date/Time   CHOL 116 03/05/2014 0630   TRIG 122 03/05/2014 0630   HDL 21* 03/05/2014 0630   CHOLHDL 5.5 03/05/2014 0630   VLDL 24 03/05/2014 0630   LDLCALC 71 03/05/2014 0630     Other studies Reviewed: Additional studies/ records that were reviewed today with results demonstrated : Echo as noted above.   ASSESSMENT AND PLAN:  1. Mitral valve endicarditis/Mitral regurg: Moderate MR by most recent echocardiogram. No symptoms of CHF. Continue to monitor. I recommended that he avoid any heavy weight lifting that would cause him to strain. Cardio exercises  would be okay. He is in agreement. 2.  Family h/o CAD: Several family members with cardiovascular disease. Stressed the importance of preventive therapy including blood pressure and lipid control. He exercises regularly. Continue aggressive diabetes control. Blood sugar spiked during his infection. It is now under much better control. 3. Hypertension/hyperlipidemia her other risk factors for him as well. Both well managed in the past.   Current medicines are reviewed at length with the patient today.  The patient concerns regarding his medicines were addressed.  The following changes  have been made:  No change. Continue current blood pressure medicines  Labs/ tests ordered today include: none No orders of the defined types were placed in this encounter.    Recommend 150 minutes/week of aerobic exercise Low fat, low carb, high fiber diet recommended  Disposition:   FU in 6 months   Teresita Madura., MD  07/06/2014 8:47 AM    [facility] [address], [place], [place]  [address] Phone: [phone]; Fax: [phone]

## 2014-07-20 ENCOUNTER — Ambulatory Visit: Payer: BLUE CROSS/BLUE SHIELD | Admitting: Cardiothoracic Surgery

## 2014-07-21 ENCOUNTER — Ambulatory Visit: Payer: BLUE CROSS/BLUE SHIELD | Admitting: Cardiothoracic Surgery

## 2014-07-25 ENCOUNTER — Ambulatory Visit: Payer: BLUE CROSS/BLUE SHIELD | Admitting: Cardiothoracic Surgery

## 2014-07-31 ENCOUNTER — Encounter: Payer: Self-pay | Admitting: Cardiothoracic Surgery

## 2014-07-31 ENCOUNTER — Ambulatory Visit (INDEPENDENT_AMBULATORY_CARE_PROVIDER_SITE_OTHER): Payer: BLUE CROSS/BLUE SHIELD | Admitting: Cardiothoracic Surgery

## 2014-07-31 VITALS — BP 140/83 | HR 63 | Resp 20 | Ht 72.0 in | Wt 190.0 lb

## 2014-07-31 DIAGNOSIS — I38 Endocarditis, valve unspecified: Secondary | ICD-10-CM

## 2014-07-31 DIAGNOSIS — I340 Nonrheumatic mitral (valve) insufficiency: Secondary | ICD-10-CM

## 2014-07-31 NOTE — Progress Notes (Signed)
[address]       [place] [address]             [phone]                    Rondel Roselli [facility] Medical Record #[id number] Date of Birth: Feb 19, 1966  Referring: Jettie Booze, MD Primary Care: Orpah Melter, MD Cardiology : Dr Irish Lack  Chief Complaint:    Chief Complaint  Patient presents with  . Follow-up    4 month f/u, HX of mitral valve endocarditis  . Mitral Regurgitation    History of Present Illness:    [name] 49 y.o. male is seen in the office  today for   Patient is a 49 year old male with previous history of hypertension diabetes and positive family history for coronary artery disease. He has no history of valvular disease . In mid-September he had multiple lump palm was removed from his arms and legs , some of these infections became infected and he was started on a course of antibiotics the most recent mid-October for 10 days . He then traveled both to [place] and [place] for work and return November 14 . He had noted while traveling being tired and having occasional night fevers and sweats . He continued to feel tired in 1 week ago saw his primary care doctor and was noted to have a markedly elevated glucose . On November 20 he was noted to wasn't long hospital with severe back spasm . MRI of the spine was performed . Then over night he became tachycardic noted he was not feeling well, BC returned positive for gram positive cocci in chains and placed on ABX Temperature was noted to raise to 103.6. The patient was brought to CT scan where he was noted to have a peroid of arms flexed and mild tremor--there was question of riggers versus seizure. On the floor he had another episode of generalized tremulous activity and "teeth chattering" which again was possibly riggers versus seizure. During this time he was not alert and only moaning while moving from bed to bed. He was intubated due to airway compromise. MRI showed embolic shower.  Patient was transferred to [facility] after TEE suggested mitral valve endocarditis.  His mental status improved and ultimaly was transferred to inpatient rehab then home. He has now completed course  of antibiotics. He denies fever and chills. He has had no SOB, DOE or other symptoms of CHF.   Since last seen patient has continued to increase strength and endurance. He notes he exercises on a regular basis without sob.    Current Activity/ Functional Status:  Patient is independent with mobility/ambulation, transfers, ADL's, IADL's.   Zubrod Score: At the time of surgery this patient's most appropriate activity status/level should be described as: [x]     0    Normal activity, no symptoms []     1    Restricted in physical strenuous activity but ambulatory, able to do out light work []     2    Ambulatory and capable of self care, unable to do work activities, up and about               >50 % of waking hours                              []     3    Only limited  self care, in bed greater than 50% of waking hours []     4    Completely disabled, no self care, confined to bed or chair []     5    Moribund   Past Medical History  Diagnosis Date  . Diabetes mellitus without complication     type 2  . Hyperlipidemia   . Allergic rhinitis   . lipoma   . H/O scoliosis   . Hypertension     Diagnostic exercise tolerance test assessment:04/30/2010 : comments normal -no evidence os ischemia by ST analysis  . Solitary kidney   . Wilm's tumor (nephroblastoma)     Past Surgical History  Procedure Laterality Date  . Lipoma excision    . Total nephrectomy Left   . Thyroid surgery    . Knee surgery      Family History  Problem Relation Age of Onset  . Heart disease Father     History   Social History  . Marital Status: Married    Spouse Name: N/A  . Number of Children: N/A  . Years of Education: N/A   Occupational History  . executive    Social History Main Topics  . Smoking  status: Never Smoker   . Smokeless tobacco: Never Used  . Alcohol Use: No  . Drug Use: No  . Sexual Activity: Not on file   Other Topics Concern  . Not on file   Social History Narrative   Never smoked    Alcohol yes, rare ,1-2 per week   No recreational drugs   Occupation-  Geneticist, molecular and other executive courses   Marital status - married     Children 1 boy          History  Smoking status  . Never Smoker   Smokeless tobacco  . Never Used    History  Alcohol Use No     No Known Allergies  Current Outpatient Prescriptions  Medication Sig Dispense Refill  . albuterol (PROVENTIL HFA;VENTOLIN HFA) 108 (90 BASE) MCG/ACT inhaler Inhale 1 puff into the lungs every 6 (six) hours as needed for wheezing or shortness of breath.    Marland Kitchen amoxicillin-clavulanate (AUGMENTIN) 875-125 MG per tablet Take 1 tablet by mouth 2 (two) times daily. 60 tablet 2  . aspirin EC 81 MG tablet Take 81 mg by mouth daily.    Marland Kitchen atorvastatin (LIPITOR) 10 MG tablet Take 1 tablet (10 mg total) by mouth daily. 30 tablet 1  . hydrochlorothiazide (HYDRODIURIL) 25 MG tablet     . lisinopril (PRINIVIL,ZESTRIL) 2.5 MG tablet Take 1 tablet (2.5 mg total) by mouth daily. 30 tablet 1  . metFORMIN (GLUCOPHAGE) 1000 MG tablet Take 1 tablet (1,000 mg total) by mouth 2 (two) times daily with a meal. (Patient taking differently: Take 500 mg by mouth 2 (two) times daily with a meal. ) 60 tablet 0  . mometasone (ASMANEX) 220 MCG/INH inhaler Inhale 1 puff into the lungs daily.    . repaglinide (PRANDIN) 0.5 MG tablet Take 0.5 mg by mouth 2 (two) times daily before a meal.    . TOPROL XL 25 MG 24 hr tablet Take 25 mg by mouth daily.      No current facility-administered medications for this visit.     Review of Systems:     Cardiac Review of Systems: Y or N  Chest Pain [  n  ]  Resting SOB [ n  ] Exertional SOB  [  n ]  Orthopnea [ n ]   Pedal Edema [ n  ]    Palpitations [ n ] Syncope  [ n ]     Presyncope [ n  ]  General Review of Systems: [Y] = yes [  ]=no Constitional: recent weight change [ y ];  Wt loss over the last 3 months [   ] anorexia [  ]; fatigue [  ]; nausea [  ]; night sweats [  ]; fever [  ]; or chills [  ];          Dental: poor dentition[  ]; Last Dentist visit:   Eye : blurred vision [  ]; diplopia [   ]; vision changes [  ];  Amaurosis fugax[  ]; Resp: cough [n  ];  wheezing[ n ];  hemoptysis[n  ]; shortness of breath[ n ]; paroxysmal nocturnal dyspnea[  n]; dyspnea on exertion[  ]; or orthopnea[  ];  GI:  gallstones[  ], vomiting[  ];  dysphagia[  ]; melena[ n ];  hematochezia [ n ]; heartburn[  ];   Hx of  Colonoscopy[  ]; GU: kidney stones [  ]; hematuria[n  ];   dysuria [n  ];  nocturia[  ];  history of     obstruction [n  ]; urinary frequency [ n ]             Skin: rash, swelling[  ];, hair loss[  ];  peripheral edema[  ];  or itching[  ]; Musculosketetal: myalgias[ n ];  joint swelling[  ];  joint erythema[n  ];  joint pain[  ];  back pain[ y ];  Heme/Lymph: bruising[  ];  bleeding[  ];  anemia[  ];  Neuro: TIA[  ];  headaches[  ];  stroke[  ];  vertigo[  ];  seizures[  ];   paresthesias[  ];  difficulty walking[  ];  Psych:depression[  ]; anxiety[  ];  Endocrine: diabetes[ y ];  thyroid dysfunction[  ];  Immunizations: Flu up to date Blue.Reese  ]; Pneumococcal up to date [ y ];  Other:  Physical Exam: BP 140/83 mmHg  Pulse 63  Resp 20  Ht 6' (1.829 m)  Wt 190 lb (86.183 kg)  BMI 25.76 kg/m2  SpO2 98%  PHYSICAL EXAMINATION:  General appearance: alert, cooperative and appears stated age Neurologic: intact Heart: regular rate and rhythm, S1, S2 normal, 2/6 systolic murmur radiating to axilla , click, rub or gallop Lungs: clear to auscultation bilaterally Abdomen: soft, non-tender; bowel sounds normal; no masses,  no organomegaly Extremities: extremities normal, atraumatic, no cyanosis or edema and Homans sign is negative, no sign of DVT no cervical  adenopathy Full dp/ pt pulses- No skin changes suggestive of emboli   Diagnostic Studies & Laboratory data:     Repeat ECHO: 04/18/2014 Study Conclusions  - Left ventricle: The cavity size was normal. Wall thickness was normal. Systolic function was normal. The estimated ejection fraction was in the range of 50% to 55%. Wall motion was normal; there were no regional wall motion abnormalities. - Mitral valve: Mild prolapse, involving the posterior leaflet. There was moderate regurgitation.  Impressions:  - Vegetation on posterior leaflet is no longer present. Prior ECHO reviewed. Mitral regurgitation remains moderate.   Recent Lab Findings: Lab Results  Component Value Date   WBC 6.8 03/09/2014   HGB 12.3* 03/09/2014   HCT 37.0* 03/09/2014   PLT 145* 03/09/2014   GLUCOSE 123* 03/09/2014  CHOL 116 03/05/2014   TRIG 122 03/05/2014   HDL 21* 03/05/2014   LDLCALC 71 03/05/2014   ALT 16 03/09/2014   AST 15 03/09/2014   NA 132* 03/09/2014   K 4.2 03/09/2014   CL 94* 03/09/2014   CREATININE 0.86 03/09/2014   BUN 14 03/09/2014   CO2 26 03/09/2014   TSH 0.549 Test methodology is 3rd generation TSH 01/02/2009   HGBA1C 10.3* 02/27/2014   I have independently    reviewed  The echo and viewed the images with the patient    Assessment / Plan:   1/ History of mitral valve endocarditis with  cleared mental status changes, with resolution of vegetations with treatment, still has asymptotic MR - moderate- I discussed with the patient  there is current no indication to proceed with surgical repair replacement of Mitral. He will need close cardiology follow up to monitor degree of MR over time with serial echos suggested repeat echo in 6 months. 2/ Diabetes 3/ Lumbar Discitis- resolved   With the patient's abnormal heart valve and previous endocarditis  the risks of endocarditis have been discussed. The recommendations for periprocedural antibiotics including dental  procedures and GI procedures have been discussed with the patient.    I have not many a follow up appointment but would be glad to see him at his or cardiology request in the future   I spent 15 minutes counseling the patient face to face. The total time spent in the appointment was 20 minutes.  David Isaac MD      [address] [place] [address] Office [phone]   Beeper [phone]  07/31/2014 8:33 PM

## 2014-07-31 NOTE — Patient Instructions (Signed)
Endocarditis Information  You may be at risk for developing endocarditis since you have  had an infected heart valve in the past. Endocarditis is an infection of the lining of the heart or heart valves.   Certain surgical and dental procedures may put you at risk,  such as teeth cleaning or other dental procedures or any surgery involving the respiratory, urinary, gastrointestinal tract, gallbladder or prostate.   Notify your doctor or dentist before having any invasive procedures. You will need to take antibiotics before certain procedures.   To prevent endocarditis, maintain good oral health. Seek prompt medical attention for any mouth/gum, skin or urinary tract infections.

## 2014-08-01 ENCOUNTER — Ambulatory Visit: Payer: BLUE CROSS/BLUE SHIELD | Admitting: Cardiothoracic Surgery

## 2014-08-09 ENCOUNTER — Ambulatory Visit: Payer: BLUE CROSS/BLUE SHIELD | Admitting: Infectious Diseases

## 2014-08-17 ENCOUNTER — Observation Stay (HOSPITAL_COMMUNITY)
Admission: EM | Admit: 2014-08-17 | Discharge: 2014-08-18 | Disposition: A | Discharge status: Home / Self Care | Payer: BLUE CROSS/BLUE SHIELD | Attending: Internal Medicine | Admitting: Internal Medicine

## 2014-08-17 ENCOUNTER — Telehealth: Payer: Self-pay | Admitting: Interventional Cardiology

## 2014-08-17 ENCOUNTER — Encounter (HOSPITAL_COMMUNITY): Payer: Self-pay | Admitting: Physical Medicine and Rehabilitation

## 2014-08-17 ENCOUNTER — Emergency Department (HOSPITAL_COMMUNITY): Payer: BLUE CROSS/BLUE SHIELD

## 2014-08-17 DIAGNOSIS — M4640 Discitis, unspecified, site unspecified: Secondary | ICD-10-CM | POA: Diagnosis present

## 2014-08-17 DIAGNOSIS — E119 Type 2 diabetes mellitus without complications: Secondary | ICD-10-CM | POA: Diagnosis not present

## 2014-08-17 DIAGNOSIS — I10 Essential (primary) hypertension: Secondary | ICD-10-CM

## 2014-08-17 DIAGNOSIS — E785 Hyperlipidemia, unspecified: Secondary | ICD-10-CM | POA: Diagnosis not present

## 2014-08-17 DIAGNOSIS — Z8619 Personal history of other infectious and parasitic diseases: Secondary | ICD-10-CM | POA: Diagnosis not present

## 2014-08-17 DIAGNOSIS — I330 Acute and subacute infective endocarditis: Secondary | ICD-10-CM

## 2014-08-17 DIAGNOSIS — Z7982 Long term (current) use of aspirin: Secondary | ICD-10-CM | POA: Diagnosis not present

## 2014-08-17 DIAGNOSIS — IMO0002 Reserved for concepts with insufficient information to code with codable children: Secondary | ICD-10-CM | POA: Diagnosis present

## 2014-08-17 DIAGNOSIS — E1165 Type 2 diabetes mellitus with hyperglycemia: Secondary | ICD-10-CM | POA: Diagnosis not present

## 2014-08-17 DIAGNOSIS — R509 Fever, unspecified: Principal | ICD-10-CM | POA: Insufficient documentation

## 2014-08-17 DIAGNOSIS — Z905 Acquired absence of kidney: Secondary | ICD-10-CM | POA: Insufficient documentation

## 2014-08-17 DIAGNOSIS — I340 Nonrheumatic mitral (valve) insufficiency: Secondary | ICD-10-CM | POA: Diagnosis not present

## 2014-08-17 LAB — CBC WITH DIFFERENTIAL/PLATELET
Basophils Absolute: 0 10*3/uL (ref 0.0–0.1)
Basophils Relative: 0 % (ref 0–1)
Eosinophils Absolute: 0 10*3/uL (ref 0.0–0.7)
Eosinophils Relative: 0 % (ref 0–5)
HCT: 43.4 % (ref 39.0–52.0)
HEMOGLOBIN: 14.8 g/dL (ref 13.0–17.0)
LYMPHS ABS: 0.7 10*3/uL (ref 0.7–4.0)
Lymphocytes Relative: 10 % — ABNORMAL LOW (ref 12–46)
MCH: 31 pg (ref 26.0–34.0)
MCHC: 34.1 g/dL (ref 30.0–36.0)
MCV: 90.8 fL (ref 78.0–100.0)
Monocytes Absolute: 0.7 10*3/uL (ref 0.1–1.0)
Monocytes Relative: 11 % (ref 3–12)
NEUTROS ABS: 5.1 10*3/uL (ref 1.7–7.7)
NEUTROS PCT: 79 % — AB (ref 43–77)
Platelets: 124 10*3/uL — ABNORMAL LOW (ref 150–400)
RBC: 4.78 MIL/uL (ref 4.22–5.81)
RDW: 13.1 % (ref 11.5–15.5)
WBC: 6.5 10*3/uL (ref 4.0–10.5)

## 2014-08-17 LAB — URINALYSIS, ROUTINE W REFLEX MICROSCOPIC
Bilirubin Urine: NEGATIVE
Glucose, UA: NEGATIVE mg/dL
Hgb urine dipstick: NEGATIVE
KETONES UR: NEGATIVE mg/dL
LEUKOCYTES UA: NEGATIVE
NITRITE: NEGATIVE
Protein, ur: NEGATIVE mg/dL
Specific Gravity, Urine: 1.021 (ref 1.005–1.030)
UROBILINOGEN UA: 0.2 mg/dL (ref 0.0–1.0)
pH: 5.5 (ref 5.0–8.0)

## 2014-08-17 LAB — COMPREHENSIVE METABOLIC PANEL
ALT: 36 U/L (ref 17–63)
AST: 33 U/L (ref 15–41)
Albumin: 3.6 g/dL (ref 3.5–5.0)
Alkaline Phosphatase: 51 U/L (ref 38–126)
Anion gap: 9 (ref 5–15)
BUN: 20 mg/dL (ref 6–20)
CALCIUM: 9.2 mg/dL (ref 8.9–10.3)
CO2: 27 mmol/L (ref 22–32)
CREATININE: 1.1 mg/dL (ref 0.61–1.24)
Chloride: 102 mmol/L (ref 101–111)
GFR calc Af Amer: >60 mL/min (ref >60)
GFR calc non Af Amer: >60 mL/min (ref >60)
Glucose, Bld: 147 mg/dL — ABNORMAL HIGH (ref 65–99)
Potassium: 4.4 mmol/L (ref 3.5–5.1)
Sodium: 138 mmol/L (ref 135–145)
TOTAL PROTEIN: 7.7 g/dL (ref 6.5–8.1)
Total Bilirubin: 1.6 mg/dL — ABNORMAL HIGH (ref 0.3–1.2)

## 2014-08-17 LAB — I-STAT CG4 LACTIC ACID, ED
Lactic Acid, Venous: 1.31 mmol/L (ref 0.5–2.0)
Lactic Acid, Venous: 1.01 mmol/L (ref 0.5–2.0)

## 2014-08-17 LAB — C-REACTIVE PROTEIN: CRP: 2.2 mg/dL — ABNORMAL HIGH (ref <1.0)

## 2014-08-17 LAB — I-STAT TROPONIN, ED: TROPONIN I, POC: 0 ng/mL (ref 0.00–0.08)

## 2014-08-17 LAB — CBG MONITORING, ED: Glucose-Capillary: 193 mg/dL — ABNORMAL HIGH (ref 65–99)

## 2014-08-17 LAB — SEDIMENTATION RATE: Sed Rate: 4 mm/hr (ref 0–16)

## 2014-08-17 MED ORDER — ONDANSETRON HCL 4 MG PO TABS: 4.0000 mg | ORAL_TABLET | Freq: Four times a day (QID) | ORAL | Status: DC | PRN

## 2014-08-17 MED ORDER — SENNOSIDES-DOCUSATE SODIUM 8.6-50 MG PO TABS: 1.0000 | ORAL_TABLET | Freq: Every evening | ORAL | Status: DC | PRN

## 2014-08-17 MED ORDER — ALBUTEROL SULFATE (2.5 MG/3ML) 0.083% IN NEBU: 2.5000 mg | INHALATION_SOLUTION | Freq: Four times a day (QID) | RESPIRATORY_TRACT | Status: DC | PRN

## 2014-08-17 MED ORDER — ONDANSETRON HCL 4 MG/2ML IJ SOLN: 4.0000 mg | Freq: Four times a day (QID) | INTRAMUSCULAR | Status: DC | PRN

## 2014-08-17 MED ORDER — ACETAMINOPHEN 325 MG PO TABS
650.0000 mg | ORAL_TABLET | Freq: Four times a day (QID) | ORAL | Status: DC | PRN
  Administered 2014-08-17: 650 mg via ORAL
  Filled 2014-08-17: qty 2

## 2014-08-17 MED ORDER — HYDROCODONE-ACETAMINOPHEN 5-325 MG PO TABS: 1.0000 | ORAL_TABLET | ORAL | Status: DC | PRN

## 2014-08-17 MED ORDER — LISINOPRIL 5 MG PO TABS
2.5000 mg | ORAL_TABLET | Freq: Every day | ORAL | Status: DC
  Administered 2014-08-18: 2.5 mg via ORAL
  Filled 2014-08-17: qty 1

## 2014-08-17 MED ORDER — FLUTICASONE PROPIONATE HFA 44 MCG/ACT IN AERO
1.0000 | INHALATION_SPRAY | Freq: Two times a day (BID) | RESPIRATORY_TRACT | Status: DC
  Filled 2014-08-17 (×2): qty 10.6

## 2014-08-17 MED ORDER — ASPIRIN EC 81 MG PO TBEC
81.0000 mg | DELAYED_RELEASE_TABLET | Freq: Every day | ORAL | Status: DC
  Administered 2014-08-18: 81 mg via ORAL
  Filled 2014-08-17: qty 1

## 2014-08-17 MED ORDER — ENOXAPARIN SODIUM 40 MG/0.4ML ~~LOC~~ SOLN
40.0000 mg | SUBCUTANEOUS | Status: DC
  Administered 2014-08-18: 40 mg via SUBCUTANEOUS
  Filled 2014-08-17: qty 0.4

## 2014-08-17 MED ORDER — ATORVASTATIN CALCIUM 10 MG PO TABS
10.0000 mg | ORAL_TABLET | Freq: Every day | ORAL | Status: DC
  Administered 2014-08-18: 10 mg via ORAL
  Filled 2014-08-17: qty 1

## 2014-08-17 MED ORDER — INSULIN ASPART 100 UNIT/ML ~~LOC~~ SOLN: 0.0000 [IU] | Freq: Every day | SUBCUTANEOUS | Status: DC

## 2014-08-17 MED ORDER — INSULIN ASPART 100 UNIT/ML ~~LOC~~ SOLN
0.0000 [IU] | Freq: Three times a day (TID) | SUBCUTANEOUS | Status: DC
Start: 2014-08-18 — End: 2014-08-18
  Administered 2014-08-18: 1 [IU] via SUBCUTANEOUS

## 2014-08-17 MED ORDER — METOPROLOL SUCCINATE ER 25 MG PO TB24
25.0000 mg | ORAL_TABLET | Freq: Every day | ORAL | Status: DC
  Administered 2014-08-18: 25 mg via ORAL
  Filled 2014-08-17: qty 1

## 2014-08-17 NOTE — Telephone Encounter (Signed)
Making sure that this message is routed correctly

## 2014-08-17 NOTE — H&P (Signed)
[facility] History and Physical  [name] XBM:[id number] DOB: 30-Apr-1965 DOA: 08/17/2014  Referring physician: ED physician PCP: David Melter, MD   Chief Complaint: fevers/chills  HPI:  [name] is a 49yo man with PMH Of HTN, DM, solitary kidney, HLD, lipoma with nodules removed.  He presented initially in 11/30 with severe back pain, rigors and high temperature.  He had [place] and reported that the fevers and night sweats started 5-6 weeks prior to admission.  Also with weight loss.  Prior to his trip for work, he had lipomas removed in September of 2015.  After excision he suffered a wound infection and received antibiotics.  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  He was treated with amp/ceftriaxone for 6 weeks.     He had a follow up visit with ID in February of this year where a repeat lumbar MRI was reviewed.  Dr. Johnnye [name] was concerned for some new changes and had him take augmentin for 3 months, which he did faithfully.  He reports that he finished that course 1 week ago.  Last night, he developed high fevers and chills.  Because of the history reported above, he was very concerned and presented to the hospital.  He reports no other symptoms, but he did have a fever of 10.3.2 in the ED, which improved with tylenol.  He reports some very mild chest pain/twinges that last about 2-3 seconds and resolve, he also notes a persistent soreness to his back, but no change tonight in his pain.  He has been having left sided calf pain as well which starts when he works out and improves by the end of his work out.  This is slowly getting better with working out.  He reports his cardiologist thinks he may have a "blockage" in the leg, and recommended continued exercise.  Otherwise, he has resumed working out every  other day, biking and he is eating and drinking normally, no other issues.    Clinic Visit 07/31/14: CT surgery: No need for surgery, follow up PRN only  Clinic Visit 07/06/14: Cardiology - seen for moderate MR, recommended aggressive control of BP and DM, continues exercise.  Repeat TTE at next visit likely per patient.  RTC in 6 months  Clinic Visit 05/08/14: ID - as noted above, MRI lumbar spine from 04/25/14 was reviewed and he was started on Augmentin   Assessment and Plan: Fever and Chills: Given his history, the most concerning for recurrence of his endocarditis or discitis/osteomyelitis.  He will be admitted for monitoring and for blood cultures - Blood cultures drawn X 1, will attempt to get another set tonight, but if cannot, will get a set with AML - Consider repeat TTE in the AM - Consider repeat lumbar spine MRI - I spoke personally with ID, and they have been consulted to see patient tomorrow - Hold antibiotics for now and await cultures - If he should become hemodynamically unstable, consider starting broad spectrum Abx - Tylenol for fever - Hydrocodone if he should have pain - CBC in the AM, he has a mild neutrophilia now, but WBC is normal.   - CRP elevated, ESR WNL  DM (diabetes mellitus), type2 - Last A1C 10.3 in hospital in our system, per patient it is now controlled - Hold metformin and prandin - SSI  sensitive    Hyperlipidemia - Continue atorvastatin  HTN - Continue home meds with hold parameters if he should develop low blood pressure    Solitary kidney, acquired - Monitor renal function closely  DVT PPx: Lovenox  Diet: Carb modified     Radiological Exams on Admission: Dg Chest 2 View  08/17/2014   CLINICAL DATA:  Fever, fatigue, chills  EXAM: CHEST  2 VIEW  COMPARISON:  04/20/2014  FINDINGS: The heart size and mediastinal contours are within normal limits. Both lungs are clear. The visualized skeletal structures are unremarkable.  IMPRESSION: No active  cardiopulmonary disease.   Electronically Signed   By: Kathreen Devoid   On: 08/17/2014 15:59     Code Status: Full Family Communication: Pt at bedside Disposition Plan: Admit for further evaluation    David Chiquito, MD [phone]   Review of Systems:  Constitutional: + for fever chills, diaphoresis, fatigue  HENT: Negative for hearing loss, ear pain, nosebleeds, congestion Eyes: Negative for blurred vision, double vision  Respiratory: Negative for cough, hemoptysis, sputum production, shortness of breath, wheezing Cardiovascular: + for mild chest pain as noted in HPI and possible claudication. Negative for palpitations, orthopnea and leg swelling.  Gastrointestinal: Negative for nausea, vomiting and abdominal pain, constipation Genitourinary: Negative for dysuria, urgency, frequency Musculoskeletal: + for back soreness Negative for myalgias, joint pain and falls.  Skin: Negative for itching and rash.  Neurological: Negative for dizziness and weakness.  Endo/Heme/Allergies: + for allergies and asthma  Psychiatric/Behavioral: Negative for suicidal ideas. The patient is not nervous/anxious.      Past Medical History  Diagnosis Date  . Diabetes mellitus without complication     type 2  . Hyperlipidemia   . Allergic rhinitis   . lipoma   . H/O scoliosis   . Hypertension     Diagnostic exercise tolerance test assessment:04/30/2010 : comments normal -no evidence os ischemia by ST analysis  . Solitary kidney   . Wilm's tumor (nephroblastoma)     Past Surgical History  Procedure Laterality Date  . Lipoma excision    . Total nephrectomy Left   . Thyroid surgery    . Knee surgery      Social History:  reports that he has never smoked. He has never used smokeless tobacco. He reports that he does not drink alcohol or use illicit drugs.  No Known Allergies  Family History  Problem Relation Age of Onset  . Heart disease Father     Prior to Admission medications   Medication Sig  Start Date End Date Taking? Authorizing Provider  albuterol (PROVENTIL HFA;VENTOLIN HFA) 108 (90 BASE) MCG/ACT inhaler Inhale 1 puff into the lungs every 6 (six) hours as needed for wheezing or shortness of breath.   Yes Historical Provider, MD  amoxicillin-clavulanate (AUGMENTIN) 875-125 MG per tablet Take 1 tablet by mouth 2 (two) times daily. 05/08/14  Yes Campbell Riches, MD  aspirin EC 81 MG tablet Take 81 mg by mouth daily.   Yes Historical Provider, MD  atorvastatin (LIPITOR) 10 MG tablet Take 1 tablet (10 mg total) by mouth daily. 03/15/14  Yes Daniel J Angiulli, PA-C  lisinopril (PRINIVIL,ZESTRIL) 2.5 MG tablet Take 1 tablet (2.5 mg total) by mouth daily. 03/15/14  Yes Daniel J Angiulli, PA-C  metFORMIN (GLUCOPHAGE) 1000 MG tablet Take 1 tablet (1,000 mg total) by mouth 2 (two) times daily with a meal. Patient taking differently: Take 500 mg by mouth 2 (two) times daily with a meal.  03/15/14  Yes Daniel J Angiulli, PA-C  mometasone (ASMANEX) 220 MCG/INH inhaler Inhale 1 puff into the lungs daily.   Yes Historical Provider, MD  repaglinide (PRANDIN) 0.5 MG tablet Take 0.5 mg by mouth 2 (two) times daily before a meal.   Yes Historical Provider, MD  TOPROL XL 25 MG 24 hr tablet Take 25 mg by mouth daily.  07/07/14  Yes Historical Provider, MD    Physical Exam: Filed Vitals:   08/17/14 1900 08/17/14 1917 08/17/14 1922 08/17/14 2012  BP: 135/68 135/68  131/75  Pulse: 99 89  79  Temp:  99.7 F (37.6 C) 101.9 F (38.8 C)   TempSrc:  Oral Rectal   Resp: 21 18  24   SpO2: 96% 96%  96%    Physical Exam  Constitutional: Appears well-developed and well-nourished. No distress. He is diaphoretic after recent tylenol HENT: Normocephalic.  Oropharynx is clear and moist.  Eyes: Conjunctivae are normal. PERRLA, no scleral icterus.  Neck: well healed scar from thyroid surgery CVS: RR, NR, S1/S2 +, no obvious murmur noted Pulmonary: Effort and breath sounds normal, no rhonchi, wheezes, rales.   Abdominal: Soft. BS +,  no distension, tenderness Musculoskeletal: No edema and no tenderness, no erythema Neuro: Alert. Normal muscle tone. No cranial nerve deficit. Skin: Skin is warm and dry. No rash noted, no erythema.  He has multiple lipomas on arms which are not infected appearing.  He has multiple scars from previous removals of lipomas, these are healing or well healed.   Psychiatric: Normal mood and affect. Behavior, judgment, thought content normal.   Labs on Admission:  Basic Metabolic Panel:  Recent Labs Lab 08/17/14 1511  NA 138  K 4.4  CL 102  CO2 27  GLUCOSE 147*  BUN 20  CREATININE 1.10  CALCIUM 9.2   Liver Function Tests:  Recent Labs Lab 08/17/14 1511  AST 33  ALT 36  ALKPHOS 51  BILITOT 1.6*  PROT 7.7  ALBUMIN 3.6   CBC:  Recent Labs Lab 08/17/14 1511  WBC 6.5  NEUTROABS 5.1  HGB 14.8  HCT 43.4  MCV 90.8  PLT 124*   CRP: 2.2 (high)  ESR normal  CBG:  Recent Labs Lab 08/17/14 2009  GLUCAP 193*    EKG: Normal sinus rhythm, no ST/T wave changes   If 7PM-7AM, please contact night-coverage [email or web address] Password [email or web address] 08/17/2014, 8:19 PM

## 2014-08-17 NOTE — ED Notes (Signed)
Paged [place]. For Dr. Rogers Blocker [phone].

## 2014-08-17 NOTE — ED Notes (Signed)
Pt presents to department for evaluation of fever, chills and fatigue. History of endocarditis. Denies chest pain. Respirations unlabored. Pt is alert and oriented x4.

## 2014-08-17 NOTE — Telephone Encounter (Signed)
Pt currently admitted to hospital 

## 2014-08-17 NOTE — Telephone Encounter (Signed)
Pt calls today stating that last night he woke up with a fever and night sweats. Complains of chills this morning. Pt took Tylenol for fever and this morning Temp was 96.8. Pt states BP last night and this morning was around 130/80 but pulse was 83-86 both times and he was concerned about his pulse because it is usually between 50-60. Pt states he was recently on ABTs from Dr. Johnnye Sima but completed them about 2 weeks ago. Pt contacted Dr. Algis Downs office first but was told to try our office due to Dr. Johnnye Sima being out of town. Pt denies any SOB, CP or dizziness. Pt states that he was told to make office aware of abnormal sx. Advised pt to also call PCP and make them aware and he states that he has already done this. Will route to Dr. Irish Lack for review and advisement.

## 2014-08-17 NOTE — ED Provider Notes (Signed)
CSN: [id number]     Arrival date & time 08/17/14  1441 History   First MD Initiated Contact with Patient 08/17/14 1542     Chief Complaint  Patient presents with  . Fever  . Fatigue  . Chills     (Consider location/radiation/quality/duration/timing/severity/associated sxs/prior Treatment) Patient is a 49 y.o. male presenting with fever. The history is provided by the patient.  Fever Max temp prior to arrival:  100 Temp source:  Oral Onset quality:  Gradual Duration:  1 day Timing:  Constant Progression:  Waxing and waning Chronicity:  New Relieved by:  Acetaminophen Worsened by:  Nothing tried Associated symptoms: chills   Associated symptoms: no chest pain, no confusion, no congestion, no cough, no diarrhea, no dysuria, no headaches, no myalgias, no nausea, no rash, no rhinorrhea, no sore throat and no vomiting   Risk factors comment:  H/o endocarditis, discitis   Past Medical History  Diagnosis Date  . Diabetes mellitus without complication     type 2  . Hyperlipidemia   . Allergic rhinitis   . lipoma   . H/O scoliosis   . Hypertension     Diagnostic exercise tolerance test assessment:04/30/2010 : comments normal -no evidence os ischemia by ST analysis  . Solitary kidney   . Wilm's tumor (nephroblastoma)    Past Surgical History  Procedure Laterality Date  . Lipoma excision    . Total nephrectomy Left   . Thyroid surgery    . Knee surgery     Family History  Problem Relation Age of Onset  . Heart disease Father    History  Substance Use Topics  . Smoking status: Never Smoker   . Smokeless tobacco: Never Used  . Alcohol Use: No    Review of Systems  Constitutional: Positive for fever and chills. Negative for diaphoresis, appetite change and fatigue.  HENT: Negative for congestion, rhinorrhea and sore throat.   Eyes: Negative for photophobia and visual disturbance.  Respiratory: Negative for cough, chest tightness and shortness of breath.    Cardiovascular: Negative for chest pain, palpitations and leg swelling.  Gastrointestinal: Negative for nausea, vomiting, abdominal pain, diarrhea and abdominal distention.  Genitourinary: Negative for dysuria, frequency, hematuria, flank pain and difficulty urinating.  Musculoskeletal: Positive for back pain. Negative for myalgias, neck pain and neck stiffness.  Skin: Negative for color change, pallor and rash.  Neurological: Negative for dizziness, syncope, weakness, light-headedness, numbness and headaches.  Psychiatric/Behavioral: Negative for confusion.  All other systems reviewed and are negative.     Allergies  Review of patient's allergies indicates no known allergies.  Home Medications   Prior to Admission medications   Medication Sig Start Date End Date Taking? Authorizing Provider  albuterol (PROVENTIL HFA;VENTOLIN HFA) 108 (90 BASE) MCG/ACT inhaler Inhale 1 puff into the lungs every 6 (six) hours as needed for wheezing or shortness of breath.   Yes Historical Provider, MD  aspirin EC 81 MG tablet Take 81 mg by mouth daily.   Yes Historical Provider, MD  atorvastatin (LIPITOR) 10 MG tablet Take 1 tablet (10 mg total) by mouth daily. 03/15/14  Yes Daniel J Angiulli, PA-C  lisinopril (PRINIVIL,ZESTRIL) 2.5 MG tablet Take 1 tablet (2.5 mg total) by mouth daily. 03/15/14  Yes Daniel J Angiulli, PA-C  metFORMIN (GLUCOPHAGE) 1000 MG tablet Take 1 tablet (1,000 mg total) by mouth 2 (two) times daily with a meal. Patient taking differently: Take 500 mg by mouth 2 (two) times daily with a meal.  03/15/14  Yes  Lavon Paganini Angiulli, PA-C  mometasone (ASMANEX) 220 MCG/INH inhaler Inhale 1 puff into the lungs daily.   Yes Historical Provider, MD  repaglinide (PRANDIN) 0.5 MG tablet Take 0.5 mg by mouth 2 (two) times daily before a meal.   Yes Historical Provider, MD  TOPROL XL 25 MG 24 hr tablet Take 25 mg by mouth daily.  07/07/14  Yes Historical Provider, MD   BP 140/76 mmHg  Pulse 64   Temp(Src) 98 F (36.7 C) (Oral)  Resp 17  Ht 6' 0.01" (1.829 m)  Wt 190 lb 14.4 oz (86.592 kg)  BMI 25.89 kg/m2  SpO2 99% Physical Exam  Constitutional: He is oriented to person, place, and time. He appears well-developed and well-nourished. No distress.  HENT:  Head: Normocephalic and atraumatic.  Mouth/Throat: Oropharynx is clear and moist.  Eyes: Conjunctivae and EOM are normal. Pupils are equal, round, and reactive to light.  Neck: Normal range of motion and full passive range of motion without pain. Neck supple. No spinous process tenderness and no muscular tenderness present. No rigidity.  Cardiovascular: Normal rate, regular rhythm, normal heart sounds and intact distal pulses.  Exam reveals no gallop and no friction rub.   No murmur heard. Pulmonary/Chest: Effort normal and breath sounds normal. No respiratory distress. He has no wheezes. He has no rales.  Abdominal: Soft. Bowel sounds are normal. He exhibits no distension. There is no tenderness. There is no rebound and no guarding.  Musculoskeletal: Normal range of motion. He exhibits no edema or tenderness.  Neurological: He is alert and oriented to person, place, and time. He has normal strength. No cranial nerve deficit or sensory deficit. Coordination and gait normal. GCS eye subscore is 4. GCS verbal subscore is 5. GCS motor subscore is 6.  Skin: Skin is warm and dry. No rash noted. He is not diaphoretic. No erythema. No pallor.  Nursing note and vitals reviewed.   ED Course  Procedures (including critical care time) Labs Review Labs Reviewed  CBC WITH DIFFERENTIAL/PLATELET - Abnormal; Notable for the following:    Platelets 124 (*)    Neutrophils Relative % 79 (*)    Lymphocytes Relative 10 (*)    All other components within normal limits  COMPREHENSIVE METABOLIC PANEL - Abnormal; Notable for the following:    Glucose, Bld 147 (*)    Total Bilirubin 1.6 (*)    All other components within normal limits  C-REACTIVE  PROTEIN - Abnormal; Notable for the following:    CRP 2.2 (*)    All other components within normal limits  CBC - Abnormal; Notable for the following:    Platelets 120 (*)    All other components within normal limits  GLUCOSE, CAPILLARY - Abnormal; Notable for the following:    Glucose-Capillary 123 (*)    All other components within normal limits  GLUCOSE, CAPILLARY - Abnormal; Notable for the following:    Glucose-Capillary 113 (*)    All other components within normal limits  CBG MONITORING, ED - Abnormal; Notable for the following:    Glucose-Capillary 193 (*)    All other components within normal limits  CULTURE, BLOOD (ROUTINE X 2)  CULTURE, BLOOD (ROUTINE X 2)  CULTURE, BLOOD (ROUTINE X 2)  CULTURE, BLOOD (ROUTINE X 2)  URINALYSIS, ROUTINE W REFLEX MICROSCOPIC  SEDIMENTATION RATE  GLUCOSE, CAPILLARY  GLUCOSE, CAPILLARY  HEMOGLOBIN A1C  I-STAT TROPOININ, ED  I-STAT CG4 LACTIC ACID, ED  I-STAT CG4 LACTIC ACID, ED    Imaging Review Dg  Chest 2 View  08/17/2014   CLINICAL DATA:  Fever, fatigue, chills  EXAM: CHEST  2 VIEW  COMPARISON:  04/20/2014  FINDINGS: The heart size and mediastinal contours are within normal limits. Both lungs are clear. The visualized skeletal structures are unremarkable.  IMPRESSION: No active cardiopulmonary disease.   Electronically Signed   By: Kathreen Devoid   On: 08/17/2014 15:59     EKG Interpretation   Date/Time:  Thursday Aug 17 2014 15:11:59 EDT Ventricular Rate:  90 PR Interval:  158 QRS Duration: 102 QT Interval:  358 QTC Calculation: 437 R Axis:   85 Text Interpretation:  Normal sinus rhythm Normal ECG No previous tracing  Confirmed by BEATON  MD, ROBERT ([id number]) on 08/17/2014 5:34:51 PM      MDM   Final diagnoses:  Discitis    49 yo M with PMH of recent endocarditis and L3-L4 discitis s/p IV abx and course of Augmentin, DM, HTN, HPL, presenting with fever.  Pt completed augmentin 2 weeks ago.  Developed fever/chills last night  with waxing and waning chills today.  Tmax 100.  Has had no HA, neck stiffness, cough, URI sx, sore throat, abdominal pain, N/V/D, dysuria. Denies chest pain, palpitations, SOB. No recent foreign travel.  No known tick exposures.    On presentation, pt alert, rectal temp 103, vitals otherwise stable.  States chills are beginning to return.  CV and lung exam WNL.  Abdomen soft, non-tender.  No midline spinous process TTP, or LE weakness/numbness.  No rashes.  No other acute findings. Tylenol given for fever.  Given pt's complex hx, discussed with ID who recommend blood cx and admission- hold abx for now.  Will also check ESR/CRP as they were elevated in 1/16 in setting of discitis, but cleared in 2/16.  Pt reports mild low back pain but not similar to when he had discitis, states it feels like his usual chronic back pain.  Labs and CXR clear.  Pt admitted to Medicine service without other acute events during my care.  Discussed with attending Dr. Audie Pinto.  Ellwood Dense, MD 08/19/14 9728  Leonard Schwartz, MD 08/19/14 [phone]

## 2014-08-17 NOTE — Telephone Encounter (Signed)
New Message  Pt called states that he is having a fever and night sweats and he was advised per previous symptoms that if he is was to have any abnormal symptoms to call. Please call back to discuss.

## 2014-08-18 ENCOUNTER — Observation Stay (HOSPITAL_BASED_OUTPATIENT_CLINIC_OR_DEPARTMENT_OTHER): Payer: BLUE CROSS/BLUE SHIELD

## 2014-08-18 DIAGNOSIS — Z905 Acquired absence of kidney: Secondary | ICD-10-CM | POA: Diagnosis not present

## 2014-08-18 DIAGNOSIS — Z8619 Personal history of other infectious and parasitic diseases: Secondary | ICD-10-CM | POA: Diagnosis not present

## 2014-08-18 DIAGNOSIS — R509 Fever, unspecified: Secondary | ICD-10-CM | POA: Diagnosis not present

## 2014-08-18 DIAGNOSIS — I340 Nonrheumatic mitral (valve) insufficiency: Secondary | ICD-10-CM | POA: Diagnosis not present

## 2014-08-18 DIAGNOSIS — M4626 Osteomyelitis of vertebra, lumbar region: Secondary | ICD-10-CM

## 2014-08-18 DIAGNOSIS — E1165 Type 2 diabetes mellitus with hyperglycemia: Secondary | ICD-10-CM | POA: Diagnosis not present

## 2014-08-18 DIAGNOSIS — B9689 Other specified bacterial agents as the cause of diseases classified elsewhere: Secondary | ICD-10-CM

## 2014-08-18 LAB — GLUCOSE, CAPILLARY
GLUCOSE-CAPILLARY: 113 mg/dL — AB (ref 65–99)
Glucose-Capillary: 123 mg/dL — ABNORMAL HIGH (ref 65–99)
Glucose-Capillary: 89 mg/dL (ref 65–99)
Glucose-Capillary: 95 mg/dL (ref 65–99)

## 2014-08-18 LAB — CBC
HCT: 43.2 % (ref 39.0–52.0)
Hemoglobin: 14.9 g/dL (ref 13.0–17.0)
MCH: 31.4 pg (ref 26.0–34.0)
MCHC: 34.5 g/dL (ref 30.0–36.0)
MCV: 90.9 fL (ref 78.0–100.0)
PLATELETS: 120 10*3/uL — AB (ref 150–400)
RBC: 4.75 MIL/uL (ref 4.22–5.81)
RDW: 13.2 % (ref 11.5–15.5)
WBC: 4.6 10*3/uL (ref 4.0–10.5)

## 2014-08-18 NOTE — Progress Notes (Signed)
   SUBJECTIVE:  Feels well  OBJECTIVE:   Vitals:   Filed Vitals:   08/17/14 2200 08/17/14 2236 08/18/14 0538 08/18/14 1410  BP: 120/59 124/76 124/72 140/76  Pulse: 68 70 71 64  Temp:  98.7 F (37.1 C) 98.8 F (37.1 C) 98 F (36.7 C)  TempSrc:  Oral Oral Oral  Resp: 14 16 16 17   Height:  6' 0.01" (1.829 m)    Weight:  190 lb 14.4 oz (86.592 kg)    SpO2: 98% 99% 97% 99%   I&O's:  No intake or output data in the 24 hours ending 08/18/14 1611      PHYSICAL EXAM General: Well developed, well nourished, in no acute distress Head:   Normal cephalic and atramatic  Lungs:   Clear bilaterally to auscultation. Heart:   HRRR S1 S2  No JVD.  2/6 systolic murmur Abdomen: abdomen soft and non-tender Msk:  Back normal,  Normal strength and tone for age. Extremities:   No edema.   Neuro: Alert and oriented. Psych:  Normal affect, responds appropriately Skin: No rash   LABS: Basic Metabolic Panel:  Recent Labs  08/17/14 1511  NA 138  K 4.4  CL 102  CO2 27  GLUCOSE 147*  BUN 20  CREATININE 1.10  CALCIUM 9.2   Liver Function Tests:  Recent Labs  08/17/14 1511  AST 33  ALT 36  ALKPHOS 51  BILITOT 1.6*  PROT 7.7  ALBUMIN 3.6   No results for input(s): LIPASE, AMYLASE in the last 72 hours. CBC:  Recent Labs  08/17/14 1511 08/18/14 0345  WBC 6.5 4.6  NEUTROABS 5.1  --   HGB 14.8 14.9  HCT 43.4 43.2  MCV 90.8 90.9  PLT 124* 120*   Cardiac Enzymes: No results for input(s): CKTOTAL, CKMB, CKMBINDEX, TROPONINI in the last 72 hours. BNP: Invalid input(s): POCBNP D-Dimer: No results for input(s): DDIMER in the last 72 hours. Hemoglobin A1C: No results for input(s): HGBA1C in the last 72 hours. Fasting Lipid Panel: No results for input(s): CHOL, HDL, LDLCALC, TRIG, CHOLHDL, LDLDIRECT in the last 72 hours. Thyroid Function Tests: No results for input(s): TSH, T4TOTAL, T3FREE, THYROIDAB in the last 72 hours.  Invalid input(s): FREET3 Anemia Panel: No  results for input(s): VITAMINB12, FOLATE, FERRITIN, TIBC, IRON, RETICCTPCT in the last 72 hours. Coag Panel:   No results found for: INR, PROTIME  RADIOLOGY: Dg Chest 2 View  08/17/2014   CLINICAL DATA:  Fever, fatigue, chills  EXAM: CHEST  2 VIEW  COMPARISON:  04/20/2014  FINDINGS: The heart size and mediastinal contours are within normal limits. Both lungs are clear. The visualized skeletal structures are unremarkable.  IMPRESSION: No active cardiopulmonary disease.   Electronically Signed   By: Kathreen Devoid   On: 08/17/2014 15:59      ASSESSMENT: History of mitral valve endocarditis, recent fever  PLAN:  Plan for limited echo to look for valvular vegetation, particularly on the mitral valve. Based on his exam, I don't think there is been any progression of valve disease. He is not showing any signs of heart failure.  No other cardiac intervention needed at this time. Await results of echocardiogram.   Jettie Booze, MD  08/18/2014  4:11 PM

## 2014-08-18 NOTE — Progress Notes (Signed)
Patient discharged to home with instructions. 

## 2014-08-18 NOTE — Consult Note (Signed)
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   Comment- He is doing very well despite his high temp.  Would consider repeat MRI of his spine to eval for possible source of temp. He has repeat BCx pending. He looks his normal state of health, his current fever could even be unrelated to his previous illnesses.   Thank you so much for this interesting consult,   Bobby Rumpf (pager) [phone] [email or web address]  [name] is an 49 y.o. male.  HPI:  49 y.o. male with poorly controlled DM2, HLD, wilm's tumor and solitary kidney, hx of thyroid nodules, lipomatosis with nodules removed, admitted on 11/30 for severe back pain but also found to have rigors, temp of 105. He works for [employer] and often travels [place] for work. He recently moved back from [place] this summer He had intermittent drenching nightsweats that possibly started 5-6 wk ago but have been more consistently at night for the past 7- 10 days. He thinks that these episodes started before his 2 wk travel that started on Oct 30th to [place] (transit thru [place]), where he had one day of n/v which he attributes to bad food. He then went to [place] ([place]) for 1 week and still had intermittent nightsweats during his trip. He returned on Nov 13th, and had 1 day of diarrhea that self resolved, but still felt weak from these illnesses. He had ~ 15 lb unintentional weight loss.   Prior to his trips overseas,he underwent excision of 20 lipomas on extremities and trunk where he has had previous excisions in the past on 12/09/13. Path from September surgery revealed  combination of angiolipomas and lipomas. On follow up exam at surgeons office, it appears some nodules were warm and inflammed concerning for infection, where he was given a 7 day course of antibiotics. He states that a few incisions had intermittent drainage. He then followed up with his PCP who gave him a 7 day course of levofloxacin on 10/19. Patient doesn't know if his nightsweats resolved during the time he was on antibiotics  In hospital he was started on vanco, had fevers, developed seizures, and was found to have BCx+ for enterococcus (pans-sens) and was treated with amp/ceftriaxone for 6 weeks. Started on amp/ceftriaxone 12-8. He was found to have MV IE as well as MRI showing: Multiple small areas of acute infarct bilaterally, consistent with cerebral emboli. Chronic microhemorrhage in the left external capsule. Question hypertension .  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  F/U TTE- 03-22-14: - Left ventricle: The cavity size was normal. Wall thickness was normal. Systolic function was normal. The estimated ejectionfraction was in the range of 55% to 60%. - Mitral valve: Myxomatous mitral valve with mild prolapse Small mobile vegetation on posterior leaflet. Compared to 03/06/14 degree of MR remains moderate. The previous vegetation appeared larger and more sessile andbroad based compared to present study. - Atrial septum: No defect or patent foramen ovale was identified. Was d/c to rehab for 1 week. Went home on 03-15-14.  Had repeat TTE 04-18-14 which showed resolution of vegetation. Repeat BCx 1-28 (-).   He had repeat MRI of his L spine on  04-25-14 showing:  1. 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 Disc space fluid has nearly completely resolved, however there is slightly progressive disc space collapse and endplate erosion with significantly increased bone marrow edema. No abscess. 2. No evidence of new infection elsewhere in the lumbar spine. 3. Unchanged appearance of lumbar  spondylosis.  He was seen in ID for f/u 05-2014 and due to his MRI changes, he had 3 months of PO augmentin added. He completed this ~ 2 week ago. He developed temp on 5-18 and came to ED on 5-19. His temp in ED was 103.2  Has had temp 101.9 in hospital.   Past Medical History  Diagnosis Date  . Diabetes mellitus without complication     type 2  . Hyperlipidemia   . Allergic rhinitis   . lipoma   . H/O scoliosis   . Hypertension     Diagnostic exercise tolerance test assessment:04/30/2010 : comments normal -no evidence os ischemia by ST analysis  . Solitary kidney   . Wilm's tumor (nephroblastoma)     Past Surgical History  Procedure Laterality Date  . Lipoma excision    . Total nephrectomy Left   . Thyroid surgery    . Knee surgery       No Known Allergies  Medications:  Scheduled: . aspirin EC  81 mg Oral Daily  . atorvastatin  10 mg Oral Daily  . enoxaparin (LOVENOX) injection  40 mg Subcutaneous Q24H  . fluticasone  1 puff Inhalation BID  . insulin aspart  0-5 Units Subcutaneous QHS  . insulin aspart  0-9 Units Subcutaneous TID WC  . lisinopril  2.5 mg Oral Daily  . metoprolol succinate  25 mg Oral Daily    Abtx:  Anti-infectives    None      Total days of antibiotics 0          Social History:  reports that he has never smoked. He has never used smokeless tobacco. He reports that he does not drink alcohol or use illicit drugs.  Family History  Problem Relation Age of Onset  . Heart disease Father     General ROS: no sick exposures. has been plaing golf, exercising. no cough/sob/rhinorrhea, no loose BM, normal urination. Back pain better.   Blood pressure 140/76, pulse 64, temperature 98 F (36.7 C), temperature source Oral, resp. rate 17, height 6' 0.01" (1.829 m), weight 86.592 kg (190 lb 14.4 oz), SpO2 99 %. General appearance: alert, cooperative and no distress Eyes: negative findings: conjunctivae and sclerae normal and pupils equal, round,  reactive to light and accomodation Throat: lips, mucosa, and tongue normal; teeth and gums normal Neck: no adenopathy and supple, symmetrical, trachea midline Back: symmetric, no curvature. ROM normal. No CVA tenderness. Lungs: clear to auscultation bilaterally Heart: regular rate and rhythm Abdomen: normal findings: bowel sounds normal and soft, non-tender Extremities: edema none   Results for orders placed or performed during the hospital encounter of 08/17/14 (from the past 48 hour(s))  CBC with Differential     Status: Abnormal   Collection Time: 08/17/14  3:11 PM  Result Value Ref Range   WBC 6.5 4.0 - 10.5 K/uL   RBC 4.78 4.22 - 5.81 MIL/uL   Hemoglobin 14.8 13.0 - 17.0 g/dL   HCT 43.4 39.0 - 52.0 %   MCV 90.8 78.0 - 100.0 fL   MCH 31.0 26.0 - 34.0 pg   MCHC 34.1 30.0 - 36.0 g/dL   RDW 13.1 11.5 - 15.5 %   Platelets 124 (  L) 150 - 400 K/uL   Neutrophils Relative % 79 (H) 43 - 77 %   Neutro Abs 5.1 1.7 - 7.7 K/uL   Lymphocytes Relative 10 (L) 12 - 46 %   Lymphs Abs 0.7 0.7 - 4.0 K/uL   Monocytes Relative 11 3 - 12 %   Monocytes Absolute 0.7 0.1 - 1.0 K/uL   Eosinophils Relative 0 0 - 5 %   Eosinophils Absolute 0.0 0.0 - 0.7 K/uL   Basophils Relative 0 0 - 1 %   Basophils Absolute 0.0 0.0 - 0.1 K/uL  Comprehensive metabolic panel     Status: Abnormal   Collection Time: 08/17/14  3:11 PM  Result Value Ref Range   Sodium 138 135 - 145 mmol/L   Potassium 4.4 3.5 - 5.1 mmol/L   Chloride 102 101 - 111 mmol/L   CO2 27 22 - 32 mmol/L   Glucose, Bld 147 (H) 65 - 99 mg/dL   BUN 20 6 - 20 mg/dL   Creatinine, Ser 1.10 0.61 - 1.24 mg/dL   Calcium 9.2 8.9 - 10.3 mg/dL   Total Protein 7.7 6.5 - 8.1 g/dL   Albumin 3.6 3.5 - 5.0 g/dL   AST 33 15 - 41 U/L   ALT 36 17 - 63 U/L   Alkaline Phosphatase 51 38 - 126 U/L   Total Bilirubin 1.6 (H) 0.3 - 1.2 mg/dL   GFR calc non Af Amer >60 >60 mL/min   GFR calc Af Amer >60 >60 mL/min    Comment: (NOTE) The eGFR has been calculated  using the CKD EPI equation. This calculation has not been validated in all clinical situations. eGFR's persistently <60 mL/min signify possible Chronic Kidney Disease.    Anion gap 9 5 - 15  I-Stat Troponin, ED (not at [facility])     Status: None   Collection Time: 08/17/14  3:29 PM  Result Value Ref Range   Troponin i, poc 0.00 0.00 - 0.08 ng/mL   Comment 3            Comment: Due to the release kinetics of cTnI, a negative result within the first hours of the onset of symptoms does not rule out myocardial infarction with certainty. If myocardial infarction is still suspected, repeat the test at appropriate intervals.   I-Stat CG4 Lactic Acid, ED     Status: None   Collection Time: 08/17/14  3:31 PM  Result Value Ref Range   Lactic Acid, Venous 1.31 0.5 - 2.0 mmol/L  Sedimentation rate     Status: None   Collection Time: 08/17/14  5:34 PM  Result Value Ref Range   Sed Rate 4 0 - 16 mm/hr  C-reactive protein     Status: Abnormal   Collection Time: 08/17/14  5:34 PM  Result Value Ref Range   CRP 2.2 (H) <1.0 mg/dL  I-Stat CG4 Lactic Acid, ED     Status: None   Collection Time: 08/17/14  5:50 PM  Result Value Ref Range   Lactic Acid, Venous 1.01 0.5 - 2.0 mmol/L  Urinalysis, Routine w reflex microscopic     Status: None   Collection Time: 08/17/14  6:31 PM  Result Value Ref Range   Color, Urine YELLOW YELLOW   APPearance CLEAR CLEAR   Specific Gravity, Urine 1.021 1.005 - 1.030   pH 5.5 5.0 - 8.0   Glucose, UA NEGATIVE NEGATIVE mg/dL   Hgb urine dipstick NEGATIVE NEGATIVE   Bilirubin Urine NEGATIVE NEGATIVE   Ketones,  ur NEGATIVE NEGATIVE mg/dL   Protein, ur NEGATIVE NEGATIVE mg/dL   Urobilinogen, UA 0.2 0.0 - 1.0 mg/dL   Nitrite NEGATIVE NEGATIVE   Leukocytes, UA NEGATIVE NEGATIVE    Comment: MICROSCOPIC NOT DONE ON URINES WITH NEGATIVE PROTEIN, BLOOD, LEUKOCYTES, NITRITE, OR GLUCOSE <1000 mg/dL.  CBG monitoring, ED     Status: Abnormal   Collection Time: 08/17/14  8:09 PM   Result Value Ref Range   Glucose-Capillary 193 (H) 65 - 99 mg/dL  Glucose, capillary     Status: None   Collection Time: 08/18/14 12:21 AM  Result Value Ref Range   Glucose-Capillary 89 65 - 99 mg/dL   Comment 1 Notify RN   CBC     Status: Abnormal   Collection Time: 08/18/14  3:45 AM  Result Value Ref Range   WBC 4.6 4.0 - 10.5 K/uL   RBC 4.75 4.22 - 5.81 MIL/uL   Hemoglobin 14.9 13.0 - 17.0 g/dL   HCT 43.2 39.0 - 52.0 %   MCV 90.9 78.0 - 100.0 fL   MCH 31.4 26.0 - 34.0 pg   MCHC 34.5 30.0 - 36.0 g/dL   RDW 13.2 11.5 - 15.5 %   Platelets 120 (L) 150 - 400 K/uL  Glucose, capillary     Status: Abnormal   Collection Time: 08/18/14  7:59 AM  Result Value Ref Range   Glucose-Capillary 123 (H) 65 - 99 mg/dL  Glucose, capillary     Status: None   Collection Time: 08/18/14 11:54 AM  Result Value Ref Range   Glucose-Capillary 95 65 - 99 mg/dL      Component Value Date/Time   SDES BLOOD RIGHT ARM 03/02/2014 1206   SPECREQUEST BOTTLES DRAWN AEROBIC AND ANAEROBIC 10CC 03/02/2014 1206   CULT  03/02/2014 1206    NO GROWTH 5 DAYS Performed at [place] 03/08/2014 FINAL 03/02/2014 1206   Dg Chest 2 View  08/17/2014   CLINICAL DATA:  Fever, fatigue, chills  EXAM: CHEST  2 VIEW  COMPARISON:  04/20/2014  FINDINGS: The heart size and mediastinal contours are within normal limits. Both lungs are clear. The visualized skeletal structures are unremarkable.  IMPRESSION: No active cardiopulmonary disease.   Electronically Signed   By: Kathreen Devoid   On: 08/17/2014 15:59   No results found for this or any previous visit (from the past 240 hour(s)).    08/18/2014, 3:02 PM

## 2014-08-18 NOTE — Progress Notes (Signed)
[facility] PROGRESS NOTE  [name] DDU:[id number] DOB: [birth year] DOA: 08/17/2014 PCP: Orpah Melter, MD  Assessment/Plan: Fever and Chills: - etiology unclear, normal WBC and ESR of 4 are re-assuring - temp of 103 at 6pm yesterday, afebrile since - Given his protracted history with Enterococcal MV endocarditis and Diskitis/Osteo needs monitoring - completed Ampicillin and ceftriaxone for 6 weeks, followed by 3 months fo Augmentin completed 1 week ago - FU blood Cx - ID consulted, Dr.Hatcher to see - no worsening back pain/sciatica etc, has mild chronic low backache, although much improved from February  DM (diabetes mellitus), type2 - Last A1C 10.3 in hospital in our system, per patient it is now controlled - Hold metformin and prandin - SSI sensitive   Hyperlipidemia - Continue atorvastatin  HTN - Continue home meds-lisinorpil and metoprolol    Solitary kidney, acquired - Monitor renal function closely  DVT proph: lovenox  Code Status: Full COde Family Communication: wife at bedside(indicate person spoken with, relationship, and if by phone, the number) Disposition Plan: to be determined   Consultants:  ID  HPI/Subjective: Fever of 103 yesterday, afebrile since  Objective: Filed Vitals:   08/18/14 0538  BP: 124/72  Pulse: 71  Temp: 98.8 F (37.1 C)  Resp: 16   No intake or output data in the 24 hours ending 08/18/14 1023 Filed Weights   08/17/14 2236  Weight: 86.592 kg (190 lb 14.4 oz)    Exam:   General:  AAOx3, no distress  Cardiovascular:S1S2/RRR  Respiratory: CTAB  Abdomen: soft, Nt, BS present  Musculoskeletal: no edema c/c  Skin: no rashes, lipomas on neck  Data Reviewed: Basic Metabolic Panel:  Recent Labs Lab 08/17/14 1511  NA 138  K 4.4  CL 102  CO2 27  GLUCOSE 147*  BUN 20  CREATININE 1.10  CALCIUM 9.2   Liver Function Tests:  Recent Labs Lab 08/17/14 1511  AST 33  ALT 36  ALKPHOS 51  BILITOT  1.6*  PROT 7.7  ALBUMIN 3.6   No results for input(s): LIPASE, AMYLASE in the last 168 hours. No results for input(s): AMMONIA in the last 168 hours. CBC:  Recent Labs Lab 08/17/14 1511 08/18/14 0345  WBC 6.5 4.6  NEUTROABS 5.1  --   HGB 14.8 14.9  HCT 43.4 43.2  MCV 90.8 90.9  PLT 124* 120*   Cardiac Enzymes: No results for input(s): CKTOTAL, CKMB, CKMBINDEX, TROPONINI in the last 168 hours. BNP (last 3 results) No results for input(s): BNP in the last 8760 hours.  ProBNP (last 3 results) No results for input(s): PROBNP in the last 8760 hours.  CBG:  Recent Labs Lab 08/17/14 2009 08/18/14 0021 08/18/14 0759  GLUCAP 193* 89 123*    No results found for this or any previous visit (from the past 240 hour(s)).   Studies: Dg Chest 2 View  08/17/2014   CLINICAL DATA:  Fever, fatigue, chills  EXAM: CHEST  2 VIEW  COMPARISON:  04/20/2014  FINDINGS: The heart size and mediastinal contours are within normal limits. Both lungs are clear. The visualized skeletal structures are unremarkable.  IMPRESSION: No active cardiopulmonary disease.   Electronically Signed   By: Kathreen Devoid   On: 08/17/2014 15:59    Scheduled Meds: . aspirin EC  81 mg Oral Daily  . atorvastatin  10 mg Oral Daily  . enoxaparin (LOVENOX) injection  40 mg Subcutaneous Q24H  . fluticasone  1 puff Inhalation BID  . insulin aspart  0-5 Units Subcutaneous QHS  .  insulin aspart  0-9 Units Subcutaneous TID WC  . lisinopril  2.5 mg Oral Daily  . metoprolol succinate  25 mg Oral Daily   Continuous Infusions:  Antibiotics Given (last 72 hours)    None      Active Problems:   DM (diabetes mellitus), type 2, uncontrolled   Hyperlipidemia   Solitary kidney, acquired   Mitral regurgitation   Fever    Time spent: 49mn    [facility] Pager [phone] If 7PM-7AM, please contact night-coverage at [email or web address], password [email or web address]5/20/2016, 10:23 AM

## 2014-08-18 NOTE — Progress Notes (Signed)
  Echocardiogram 2D Echocardiogram has been performed.  Johny Chess 08/18/2014, 5:48 PM

## 2014-08-18 NOTE — Discharge Summary (Signed)
Physician Discharge Summary  [name] WVP:[id number] DOB: 07-08-1965 DOA: 08/17/2014  PCP: Orpah Melter, MD  Admit date: 08/17/2014 Discharge date: 08/18/2014  Time spent: 45 minutes  Recommendations for Outpatient Follow-up:  1. Outpatient MRI in 2-3days 2. Please have PCP FU blood cultures and FU ECHO report 3. FU with Dr.Hatcher in June and call sooner if recurrent fevers  Discharge Diagnoses:    Fever   DM (diabetes mellitus), type 2, uncontrolled   Hyperlipidemia   Solitary kidney, acquired   Mitral regurgitation   H/o mitral valve endocarditis   H/o Discitis and Osteomyelitis  Discharge Condition: stable  Diet recommendation: diabetic  Filed Weights   08/17/14 2236  Weight: 86.592 kg (190 lb 14.4 oz)    History of present illness:  Chief Complaint: fevers/chills  HPI:  [name] is a 49yo man with PMH Of HTN, DM, solitary kidney, HLD, lipoma with nodules removed. He presented initially in 11/30 with severe back pain, rigors and high temperature. He had been [place] and reported that the fevers and night sweats started 5-6 weeks prior to admission. Also with weight loss. Prior to his trip for work, he had lipomas removed in September of 2015. After excision he suffered a wound infection and received antibiotics. 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 He was treated with amp/ceftriaxone for 6 weeks. He had a follow up visit with ID in February of this year where a repeat lumbar MRI was reviewed. Dr. Johnnye Sima was concerned for some new changes and had him take augmentin for 3 months, which he did faithfully. He reports that he finished that course 1 week ago. 5/18 night, he developed high fevers and chills. Because of the history reported above, he was very concerned and presented to  the hospital.  Hospital Course:  Fever : Febrile in ER to 103,  afebrile since then, was seen by ID Dr.Hatcher in consultation, etiology unclear, WBC and ESR normal which are reassuring. -history with Enterococcal MV endocarditis and Diskitis/Osteomyelitis- completed Ampicillin and ceftriaxone for 6 weeks, followed by 3 months fo Augmentin completed 1 week ago -ECHO just done results pending and MRI ordered and pending but patient is adamant to be discharged and pursue the results of ECHo and get MRI done as outpatient, he understands the risks. -Per ID, holding on antibiotics at this time. -Patient is hemodynamically stable, non toxic without any s/s of Sepsis at this time. -I discussed with Dr.Hatcher that patient is adamant to leave and he was ok with [name] pursuing further workup including MRI as outpatient. -Will see Dr.Hatcher in early June. -Needs Blood culture and ECHO results followed up on.  Procedures:  ECHo just done, results pending  MRI pending   Consultations:  ID  Cards  Discharge Exam: Filed Vitals:   08/18/14 1410  BP: 140/76  Pulse: 64  Temp: 98 F (36.7 C)  Resp: 17    General: AAOx3 Cardiovascular: S1S2/RRR Respiratory: CTAB  Discharge Instructions   Discharge Instructions    Diet - low sodium heart healthy    Complete by:  As directed      Diet Carb Modified    Complete by:  As directed      Increase activity slowly    Complete by:  As directed           Current  Discharge Medication List    CONTINUE these medications which have NOT CHANGED   Details  albuterol (PROVENTIL HFA;VENTOLIN HFA) 108 (90 BASE) MCG/ACT inhaler Inhale 1 puff into the lungs every 6 (six) hours as needed for wheezing or shortness of breath.    aspirin EC 81 MG tablet Take 81 mg by mouth daily.    atorvastatin (LIPITOR) 10 MG tablet Take 1 tablet (10 mg total) by mouth daily. Qty: 30 tablet, Refills: 1    lisinopril (PRINIVIL,ZESTRIL) 2.5 MG tablet Take 1  tablet (2.5 mg total) by mouth daily. Qty: 30 tablet, Refills: 1    metFORMIN (GLUCOPHAGE) 1000 MG tablet Take 1 tablet (1,000 mg total) by mouth 2 (two) times daily with a meal. Qty: 60 tablet, Refills: 0    mometasone (ASMANEX) 220 MCG/INH inhaler Inhale 1 puff into the lungs daily.    repaglinide (PRANDIN) 0.5 MG tablet Take 0.5 mg by mouth 2 (two) times daily before a meal.    TOPROL XL 25 MG 24 hr tablet Take 25 mg by mouth daily.       STOP taking these medications     amoxicillin-clavulanate (AUGMENTIN) 875-125 MG per tablet        No Known Allergies Follow-up Information    Follow up with Orpah Melter, MD In 3 days.   Specialty:  Family Medicine   Why:  please FU blood cultures, ECHO and Get MRI scheduled    Contact information:   [address] [place] [address] [phone]       Follow up with Bobby Rumpf, MD.   Specialty:  Infectious Diseases   Why:  please keep appt in June and call sooner for appt if persistent fevers   Contact information:   [place] [id number] [phone]        The results of significant diagnostics from this hospitalization (including imaging, microbiology, ancillary and laboratory) are listed below for reference.    Significant Diagnostic Studies: Dg Chest 2 View  08/17/2014   CLINICAL DATA:  Fever, fatigue, chills  EXAM: CHEST  2 VIEW  COMPARISON:  04/20/2014  FINDINGS: The heart size and mediastinal contours are within normal limits. Both lungs are clear. The visualized skeletal structures are unremarkable.  IMPRESSION: No active cardiopulmonary disease.   Electronically Signed   By: Kathreen Devoid   On: 08/17/2014 15:59    Microbiology: No results found for this or any previous visit (from the past 240 hour(s)).   Labs: Basic Metabolic Panel:  Recent Labs Lab 08/17/14 1511  NA 138  K 4.4  CL 102  CO2 27  GLUCOSE 147*  BUN 20  CREATININE 1.10  CALCIUM 9.2   Liver  Function Tests:  Recent Labs Lab 08/17/14 1511  AST 33  ALT 36  ALKPHOS 51  BILITOT 1.6*  PROT 7.7  ALBUMIN 3.6   No results for input(s): LIPASE, AMYLASE in the last 168 hours. No results for input(s): AMMONIA in the last 168 hours. CBC:  Recent Labs Lab 08/17/14 1511 08/18/14 0345  WBC 6.5 4.6  NEUTROABS 5.1  --   HGB 14.8 14.9  HCT 43.4 43.2  MCV 90.8 90.9  PLT 124* 120*   Cardiac Enzymes: No results for input(s): CKTOTAL, CKMB, CKMBINDEX, TROPONINI in the last 168 hours. BNP: BNP (last 3 results) No results for input(s): BNP in the last 8760 hours.  ProBNP (last 3 results) No results for input(s): PROBNP in the last 8760  hours.  CBG:  Recent Labs Lab 08/17/14 2009 08/18/14 0021 08/18/14 0759 08/18/14 1154 08/18/14 1647  GLUCAP 193* 89 123* 95 113*       Signed:  Anaih Brander  [facility] 08/18/2014, 5:59 PM

## 2014-08-19 LAB — HEMOGLOBIN A1C
Hgb A1c MFr Bld: 6.3 % — ABNORMAL HIGH (ref 4.8–5.6)
Mean Plasma Glucose: 134 mg/dL

## 2014-08-21 ENCOUNTER — Encounter: Payer: Self-pay | Admitting: Infectious Diseases

## 2014-08-21 ENCOUNTER — Telehealth: Payer: Self-pay | Admitting: Interventional Cardiology

## 2014-08-21 ENCOUNTER — Telehealth: Payer: Self-pay | Admitting: *Deleted

## 2014-08-21 ENCOUNTER — Other Ambulatory Visit: Payer: Self-pay | Admitting: Infectious Diseases

## 2014-08-21 DIAGNOSIS — M4626 Osteomyelitis of vertebra, lumbar region: Secondary | ICD-10-CM

## 2014-08-21 NOTE — Telephone Encounter (Signed)
New message ° ° ° ° ° °Want echo results °

## 2014-08-21 NOTE — Telephone Encounter (Signed)
Patient called requesting an MRI ordered by Dr. Johnnye Sima. We do not have any clinic notes to support this and it needs to be prior authorized through his insurance. Patient has appointment next week on 08/30/14. If the MRI needs to be ordered prior to this please do a phone note stating the diagnosis and reason to support this; so Luanna Salk will have notes to provide to his insurance. Thank you

## 2014-08-21 NOTE — Telephone Encounter (Signed)
Please put order for MRI in EPIC

## 2014-08-21 NOTE — Telephone Encounter (Signed)
Left message for pt to call back. Echo has not been officially resulted by Dr. Irish Lack. Will forward to Dr. Irish Lack for review and advisement.

## 2014-08-21 NOTE — Telephone Encounter (Signed)
Pt returned call and I informed him that I sent over the message to Dr. Irish Lack for official results on echo. Pt verbalized understanding.

## 2014-08-21 NOTE — Telephone Encounter (Signed)
Please see hospital note 5-20 written by me Needs MRI thanks

## 2014-08-22 NOTE — Telephone Encounter (Signed)
No vegetations noted.  If fever persist, would consider TEE.

## 2014-08-23 NOTE — Telephone Encounter (Signed)
Informed pt of results per Dr. Irish Lack. Pt states that he has not experienced anymore fever since Dr. Irish Lack seen him at the hospital. Informed pt that if fever were to return to please let us know. Pt verbalized understanding and was in agreement with this plan.

## 2014-08-23 NOTE — Telephone Encounter (Signed)
Left message to call back  

## 2014-08-24 LAB — CULTURE, BLOOD (ROUTINE X 2)
Culture: NO GROWTH
Culture: NO GROWTH
Culture: NO GROWTH
Culture: NO GROWTH

## 2014-08-30 ENCOUNTER — Ambulatory Visit (INDEPENDENT_AMBULATORY_CARE_PROVIDER_SITE_OTHER): Payer: BLUE CROSS/BLUE SHIELD | Admitting: Infectious Diseases

## 2014-08-30 VITALS — BP 125/84 | HR 60 | Temp 98.4°F | Ht 72.0 in | Wt 196.0 lb

## 2014-08-30 DIAGNOSIS — R7881 Bacteremia: Secondary | ICD-10-CM | POA: Diagnosis not present

## 2014-08-30 DIAGNOSIS — B952 Enterococcus as the cause of diseases classified elsewhere: Secondary | ICD-10-CM | POA: Diagnosis not present

## 2014-08-30 DIAGNOSIS — M4626 Osteomyelitis of vertebra, lumbar region: Secondary | ICD-10-CM

## 2014-08-30 NOTE — Progress Notes (Signed)
Subjective:    Patient ID: [name], male    DOB: May 13, 1965, 49 y.o.   MRN: [id number]  HPI 49 y.o. male with poorly controlled DM2, HLD, wilm's tumor and solitary kidney, hx of thyroid nodules, lipomatosis with nodules removed, admitted on 11/30 for severe back pain but also found to have rigors, temp of 105. He works for [employer] and often travels [place] for work. He recently moved back from [place] this summer He had intermittent drenching nightsweats that possibly started 5-6 wk ago but have been more consistently at night for the past 7- 10 days. He thinks that these episodes started before his 2 wk travel that started on Oct 30th to [place] (transit thru [place]), where he had one day of n/v which he attributes to bad food. He then went to [place] ([place]) for 1 week and still had intermittent nightsweats during his trip. He returned on Nov 13th, and had 1 day of diarrhea that self resolved, but still felt weak from these illnesses. He had ~ 15 lb unintentional weight loss.   Prior to his trips overseas,he underwent excision of 20 lipomas on extremities and trunk where he has had previous excisions in the past on 12/09/13. 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 On follow up exam at surgeons office, it appears some nodules were warm and inflammed concerning for infection, where he was given a 7 day course of antibiotics. He states that a few incisions had intermittent drainage. He then followed up with his PCP who gave him a 7 day course of levofloxacin on 10/19. Patient doesn't know if his nightsweats resolved during the time he was on antibiotics  In hospital he was started on vanco, had fevers, developed seizures, and was found to have BCx+ for enterococcus (pans-sens) and was treated with amp/ceftriaxone for 6 weeks. Started on amp/ceftriaxone 12-8. 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 Chronic microhemorrhage in the left external capsule. Question hypertension .  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  F/U TTE- 03-22-14: - Left ventricle: The cavity size was normal. Wall thickness was normal. Systolic function was normal. The estimated ejectionfraction was in the range of 55% to 60%. - Mitral valve: Myxomatous mitral valve with mild prolapse Small mobile vegetation on posterior leaflet. Compared to 03/06/14 degree of MR remains moderate. The previous vegetation appeared larger and more sessile andbroad based compared to present study. - Atrial septum: No defect or patent foramen ovale was identified. Was d/c to rehab for 1 week. Went home on 03-15-14.  Had repeat TTE 04-18-14 which showed resolution of vegetation. Repeat BCx 1-28 (-).   He had repeat MRI of his L spine on 04-25-14 showing:  1. 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 Disc space fluid has nearly completely resolved, however there is slightly progressive disc space collapse and endplate erosion with significantly increased bone marrow edema. No abscess. 2. No evidence of new infection elsewhere in the lumbar spine. 3. Unchanged appearance of lumbar spondylosis.  He was seen in ID for f/u 05-2014 and due to his MRI changes, he had 3 months of PO augmentin added. He completed this early May. He developed temp on 5-18 and came to ED on 5-19. His temp in ED was 103.2. In hospital his fever resolved and he felt well. He had TTE 5-20: - Limited echo to look for endocarditis. The mitral valve isthickenend with mild prolapse  of the posterior leaflet. Three aretwo jets of MR. No obvious vegatations but if clinical suspicionendocarditis is present would recommend TEE. He had repeat BCx (-) x 3.  He wished to go home and was d/c on 5-20 off anbx. He was afebrile off anbx.   Now he relates that others in his office have had high fevers.   Back feels fine, back to  normal. Occasional soreness but he is back to full exercising.   Review of Systems     Objective:   Physical Exam  Constitutional: He appears well-developed and well-nourished.  HENT:  Mouth/Throat: No oropharyngeal exudate.  Eyes: EOM are normal. Pupils are equal, round, and reactive to light.  Neck: Neck supple.  Cardiovascular: Normal rate, regular rhythm and normal heart sounds.   Pulmonary/Chest: Effort normal and breath sounds normal.  Abdominal: Soft. Bowel sounds are normal. There is no tenderness.  Musculoskeletal:       Arms: Lymphadenopathy:    He has no cervical adenopathy.          Assessment & Plan:

## 2014-08-30 NOTE — Assessment & Plan Note (Signed)
His repeat BCx are (-). I believe he had a viral syndrome as evidenced by his sick contacts.  I asked him to call me if he had further sx.  Will o/w see him back prn.

## 2014-08-30 NOTE — Assessment & Plan Note (Signed)
Will defer getting a f/u MRI. He is doing very well and is back to his routine exercise without difficulty.  His repeat BCx were (-).

## 2014-09-13 ENCOUNTER — Ambulatory Visit (HOSPITAL_COMMUNITY): Payer: BLUE CROSS/BLUE SHIELD

## 2014-11-16 ENCOUNTER — Encounter: Payer: Self-pay | Admitting: Interventional Cardiology

## 2014-11-20 ENCOUNTER — Telehealth: Payer: Self-pay | Admitting: Interventional Cardiology

## 2014-11-20 DIAGNOSIS — I330 Acute and subacute infective endocarditis: Secondary | ICD-10-CM

## 2014-11-20 NOTE — Telephone Encounter (Signed)
New Message   Pt wants to have labs ordered  And echo ordered please call pt

## 2014-11-20 NOTE — Telephone Encounter (Signed)
Patient has an appointment with Dr. Irish Lack on 01/11/2015. Patient is requesting to have labs ordered and a repeat echo ordered. Last 2D echo was 04/18/2014 and a limited echo on 08/18/2014 while in [place]. Also, patient's last labs were during his hospital stay back in May. Will forward to Dr. Irish Lack for order if advised.

## 2014-11-23 ENCOUNTER — Telehealth (HOSPITAL_COMMUNITY): Payer: Self-pay | Admitting: *Deleted

## 2014-11-23 NOTE — Telephone Encounter (Signed)
Left message to call back. Orders have been placed for echo and labs.

## 2014-11-23 NOTE — Telephone Encounter (Signed)
OK for CBC, CMet and echo.  Can use endocarditis as diagnosis code.

## 2014-11-24 NOTE — Telephone Encounter (Signed)
Spoke with pt and informed him that Dr. Irish Lack said ok to get CBC, CMET and echo. Pt states that he wants to have this done the week before his appt. Informed pt that I would send information to our echo scheduler, Ebony and have her call him with an appt. Advised pt that I would schedule labs same day as echo once it is scheduled. Pt verbalized understanding and was in agreement with this plan.

## 2015-01-04 ENCOUNTER — Other Ambulatory Visit: Payer: Self-pay

## 2015-01-04 ENCOUNTER — Ambulatory Visit (HOSPITAL_COMMUNITY): Payer: BLUE CROSS/BLUE SHIELD | Attending: Cardiovascular Disease

## 2015-01-04 ENCOUNTER — Other Ambulatory Visit (INDEPENDENT_AMBULATORY_CARE_PROVIDER_SITE_OTHER): Payer: BLUE CROSS/BLUE SHIELD

## 2015-01-04 DIAGNOSIS — I330 Acute and subacute infective endocarditis: Secondary | ICD-10-CM

## 2015-01-04 DIAGNOSIS — E119 Type 2 diabetes mellitus without complications: Secondary | ICD-10-CM | POA: Diagnosis not present

## 2015-01-04 DIAGNOSIS — I517 Cardiomegaly: Secondary | ICD-10-CM | POA: Diagnosis not present

## 2015-01-04 DIAGNOSIS — E785 Hyperlipidemia, unspecified: Secondary | ICD-10-CM | POA: Diagnosis not present

## 2015-01-04 DIAGNOSIS — I371 Nonrheumatic pulmonary valve insufficiency: Secondary | ICD-10-CM | POA: Diagnosis not present

## 2015-01-04 DIAGNOSIS — I071 Rheumatic tricuspid insufficiency: Secondary | ICD-10-CM | POA: Insufficient documentation

## 2015-01-04 DIAGNOSIS — I38 Endocarditis, valve unspecified: Secondary | ICD-10-CM | POA: Diagnosis present

## 2015-01-04 DIAGNOSIS — I77810 Thoracic aortic ectasia: Secondary | ICD-10-CM | POA: Diagnosis not present

## 2015-01-04 DIAGNOSIS — I340 Nonrheumatic mitral (valve) insufficiency: Secondary | ICD-10-CM | POA: Diagnosis not present

## 2015-01-04 DIAGNOSIS — I10 Essential (primary) hypertension: Secondary | ICD-10-CM

## 2015-01-04 DIAGNOSIS — I341 Nonrheumatic mitral (valve) prolapse: Secondary | ICD-10-CM | POA: Diagnosis not present

## 2015-01-04 LAB — COMPREHENSIVE METABOLIC PANEL
ALBUMIN: 4.1 g/dL (ref 3.6–5.1)
ALT: 26 U/L (ref 9–46)
AST: 25 U/L (ref 10–40)
Alkaline Phosphatase: 43 U/L (ref 40–115)
BILIRUBIN TOTAL: 1.3 mg/dL — AB (ref 0.2–1.2)
BUN: 19 mg/dL (ref 7–25)
CHLORIDE: 104 mmol/L (ref 98–110)
CO2: 28 mmol/L (ref 20–31)
CREATININE: 1.07 mg/dL (ref 0.60–1.35)
Calcium: 9.4 mg/dL (ref 8.6–10.3)
Glucose, Bld: 132 mg/dL — ABNORMAL HIGH (ref 65–99)
Potassium: 4.5 mmol/L (ref 3.5–5.3)
SODIUM: 136 mmol/L (ref 135–146)
TOTAL PROTEIN: 7.3 g/dL (ref 6.1–8.1)

## 2015-01-04 LAB — CBC
HCT: 43.9 % (ref 39.0–52.0)
Hemoglobin: 15.4 g/dL (ref 13.0–17.0)
MCH: 31.8 pg (ref 26.0–34.0)
MCHC: 35.1 g/dL (ref 30.0–36.0)
MCV: 90.7 fL (ref 78.0–100.0)
MPV: 10.2 fL (ref 8.6–12.4)
Platelets: 144 10*3/uL — ABNORMAL LOW (ref 150–400)
RBC: 4.84 MIL/uL (ref 4.22–5.81)
RDW: 13.3 % (ref 11.5–15.5)
WBC: 5.1 10*3/uL (ref 4.0–10.5)

## 2015-01-11 ENCOUNTER — Encounter: Payer: Self-pay | Admitting: Interventional Cardiology

## 2015-01-11 ENCOUNTER — Telehealth: Payer: Self-pay | Admitting: *Deleted

## 2015-01-11 ENCOUNTER — Ambulatory Visit (INDEPENDENT_AMBULATORY_CARE_PROVIDER_SITE_OTHER): Payer: BLUE CROSS/BLUE SHIELD | Admitting: Interventional Cardiology

## 2015-01-11 VITALS — BP 112/60 | HR 91 | Ht 72.0 in | Wt 196.4 lb

## 2015-01-11 DIAGNOSIS — I340 Nonrheumatic mitral (valve) insufficiency: Secondary | ICD-10-CM

## 2015-01-11 DIAGNOSIS — E119 Type 2 diabetes mellitus without complications: Secondary | ICD-10-CM

## 2015-01-11 DIAGNOSIS — I10 Essential (primary) hypertension: Secondary | ICD-10-CM

## 2015-01-11 DIAGNOSIS — Z8249 Family history of ischemic heart disease and other diseases of the circulatory system: Secondary | ICD-10-CM

## 2015-01-11 NOTE — Telephone Encounter (Signed)
-----   Message from Jettie Booze, MD sent at 01/08/2015  5:03 PM EDT ----- Mildly decreased LV function.  Moderate MR, MV prolapse.

## 2015-01-11 NOTE — Telephone Encounter (Signed)
Called and spoke to pt r:e: ECHO results and Renal and Liver function test.  Pt was advised that his ECHO had no changes since the one in 07-2014, other than the LV function was a little lower. Pt was advised that at this time, Dr. Irish Lack didn't see anything in the ECHO to be concerned about and nothing else at this time needed to be done.  Pt was very appreciative of the conversation and verbalized understanding.

## 2015-01-11 NOTE — Progress Notes (Signed)
Patient ID: [name], male   DOB: 1965-05-30, 49 y.o.   MRN: [id number]     Cardiology Office Note   Date:  01/11/2015   ID:  [name], DOB 1965-08-16, MRN [id number]  PCP:  Orpah Melter, MD    Chief Complaint  Patient presents with  . Follow-up  mitral valve disease, cholesterol   Wt Readings from Last 3 Encounters:  01/11/15 196 lb 6.4 oz (89.086 kg)  08/30/14 196 lb (88.905 kg)  08/17/14 190 lb 14.4 oz (86.592 kg)       History of Present Illness: [name] is a 49 y.o. male  who has a family h/o CAD at a young age. He has had HTN for over 10 years. BP is usually controlled on meds. He had lipoma removal and then a skin infection in 2015. He was given an ABx and then he continued to feel poorly. 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 He completed a 6 week course of antibiotics to treat enterococcus in his blood. He was evaluated by cardiac surgery as well. He had moderate mitral regurgitation noted on his echocardiogram in January 2016. He is being managed medically.  He typically traveled for work prior to his endocarditis. He has not been traveling recently. He is back to working out regularly. He does cardio 45 minutes total b/w bike and elliptical and treadmill. He tries to do something every other day.  He is doing weights.   He has had knee issues. No CP, SHOB. He takes asthma medicine, related to allergies. Pulse during workouts go to 135-150. No problems with exercise.  Increasing stamina.   He had an echo many years ago and was told he had a mildly leaky valve. This was likely the mitral valve prolapse that was later seen at the time of his endocarditis. 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 He is working on portion control. He passed a stress test. He had a thyroidectomy.    Family history includes father with CABG, bilateral CEA. CABG at about  21. Mother as low BP and thyroid cancer. Sister has not had problems. Paternal GM with stroke.   Repeat echo revealed improvement of his left ventricular function and moderate mitral regurgitation. This was performed in January 2016.   Most recent echo in October 2016 and showed moderate MR with EF 45-50%.  He continues to feel well.   No further appts with CT surgery planned.      Past Medical History  Diagnosis Date  . Diabetes mellitus without complication ([place])     type 2  . Hyperlipidemia   . Allergic rhinitis   . lipoma   . H/O scoliosis   . Hypertension     Diagnostic exercise tolerance test assessment:04/30/2010 : comments normal -no evidence os ischemia by ST analysis  . Solitary kidney   . Wilm's tumor (nephroblastoma) [facility])     Past Surgical History  Procedure Laterality Date  . Lipoma excision    . Total nephrectomy Left   . Thyroid surgery    . Knee surgery       Current Outpatient Prescriptions  Medication Sig Dispense Refill  . albuterol (PROVENTIL HFA;VENTOLIN HFA) 108 (90 BASE) MCG/ACT inhaler Inhale 1 puff into the lungs every 6 (six) hours as needed for wheezing or shortness of breath.    Marland Kitchen aspirin EC 81 MG tablet Take 81 mg by mouth daily.    Marland Kitchen  atorvastatin (LIPITOR) 10 MG tablet Take 1 tablet (10 mg total) by mouth daily. 30 tablet 1  . lisinopril (PRINIVIL,ZESTRIL) 2.5 MG tablet Take 1 tablet (2.5 mg total) by mouth daily. 30 tablet 1  . metFORMIN (GLUCOPHAGE) 500 MG tablet Take 500 mg by mouth 2 (two) times daily.    . mometasone (ASMANEX) 220 MCG/INH inhaler Inhale 1 puff into the lungs daily.    . repaglinide (PRANDIN) 0.5 MG tablet Take 0.5 mg by mouth 2 (two) times daily before a meal.    . TOPROL XL 25 MG 24 hr tablet Take 25 mg by mouth daily.      No current facility-administered medications for this visit.    Allergies:   Review of patient's allergies indicates no known allergies.    Social History:  The patient  reports that he has  never smoked. He has never used smokeless tobacco. He reports that he does not drink alcohol or use illicit drugs.   Family History:  The patient's family history includes Cancer in his mother; Healthy in his sister; Heart disease in his father; Hernia in his father; Hypotension in his mother.    ROS:  Please see the history of present illness.   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   All other systems are reviewed and negative.    PHYSICAL EXAM: VS:  BP 112/60 mmHg  Pulse 91  Ht 6' (1.829 m)  Wt 196 lb 6.4 oz (89.086 kg)  BMI 26.63 kg/m2  SpO2 96% , BMI Body mass index is 26.63 kg/(m^2). GEN: Well nourished, well developed, in no acute distress HEENT: normal Neck: no JVD, carotid bruits, or masses Cardiac: RRR; A1/9 systolic  murmurs, rubs, or gallops,no edema  Respiratory:  clear to auscultation bilaterally, normal work of breathing GI: soft, nontender, nondistended, + BS MS: no deformity or atrophy Skin: warm and dry, no rash Neuro:  Strength and sensation are intact Psych: euthymic mood, full affect     Recent Labs: 01/04/2015: ALT 26; BUN 19; Creat 1.07; Hemoglobin 15.4; Platelets 144*; Potassium 4.5; Sodium 136   Lipid Panel    Component Value Date/Time   CHOL 116 03/05/2014 0630   TRIG 122 03/05/2014 0630   HDL 21* 03/05/2014 0630   CHOLHDL 5.5 03/05/2014 0630   VLDL 24 03/05/2014 0630   LDLCALC 71 03/05/2014 0630     Other studies Reviewed: Additional studies/ records that were reviewed today with results demonstrating: echo as above.   ASSESSMENT AND PLAN:  1. Mitral valve disease:  Moderate mitral regurgitation with stable left ventricular function. Will repeat echocardiogram in a year. We'll see the patient back in about 6 months. 2. Family history of CAD: Several members of his family with heart disease. His hypertension and hyperlipidemia are well controlled. LDL from December was 48. 3. Diabetes: Managed  by his primary care physician. A1c was well controlled. 4. Stressed the importance of SBE prophylaxis with dental work given his prior endocarditis.   Current medicines are reviewed at length with the patient today.  The patient concerns regarding his medicines were addressed.  The following changes have been made:  No change  Labs/ tests ordered today include:  No orders of the defined types were placed in this encounter.    Recommend 150 minutes/week of aerobic exercise Low fat, low carb, high fiber diet recommended  Disposition:   FU in 6 months   Signed, Jettie Booze., MD  01/11/2015 9:53  AM    [facility] [address], [place], [place]  [address] Phone: [phone]; Fax: [phone]

## 2015-01-11 NOTE — Patient Instructions (Signed)
Medication Instructions:  Same-no changes  Labwork: None  Testing/Procedures: None  Follow-Up: Your physician wants you to follow-up in: 6 months. You will receive a reminder letter in the mail two months in advance. If you don't receive a letter, please call our office to schedule the follow-up appointment.      

## 2015-01-14 DIAGNOSIS — M4646 Discitis, unspecified, lumbar region: Secondary | ICD-10-CM | POA: Diagnosis not present

## 2015-01-14 DIAGNOSIS — E1140 Type 2 diabetes mellitus with diabetic neuropathy, unspecified: Secondary | ICD-10-CM | POA: Diagnosis not present

## 2015-01-14 DIAGNOSIS — I059 Rheumatic mitral valve disease, unspecified: Secondary | ICD-10-CM | POA: Diagnosis not present

## 2015-01-14 DIAGNOSIS — I76 Septic arterial embolism: Secondary | ICD-10-CM | POA: Diagnosis not present

## 2015-07-27 ENCOUNTER — Ambulatory Visit (INDEPENDENT_AMBULATORY_CARE_PROVIDER_SITE_OTHER): Payer: BLUE CROSS/BLUE SHIELD | Admitting: Interventional Cardiology

## 2015-07-27 ENCOUNTER — Encounter: Payer: Self-pay | Admitting: Interventional Cardiology

## 2015-07-27 VITALS — BP 100/68 | HR 71 | Ht 72.0 in | Wt 200.0 lb

## 2015-07-27 DIAGNOSIS — E1165 Type 2 diabetes mellitus with hyperglycemia: Secondary | ICD-10-CM

## 2015-07-27 DIAGNOSIS — I10 Essential (primary) hypertension: Secondary | ICD-10-CM | POA: Diagnosis not present

## 2015-07-27 DIAGNOSIS — Z8249 Family history of ischemic heart disease and other diseases of the circulatory system: Secondary | ICD-10-CM

## 2015-07-27 DIAGNOSIS — IMO0001 Reserved for inherently not codable concepts without codable children: Secondary | ICD-10-CM

## 2015-07-27 DIAGNOSIS — I340 Nonrheumatic mitral (valve) insufficiency: Secondary | ICD-10-CM

## 2015-07-27 NOTE — Patient Instructions (Signed)
Medication Instructions:  Same-no changes  Labwork: None  Testing/Procedures: None  Follow-Up: Your physician wants you to follow-up in: November or December 2017. You will receive a reminder letter in the mail two months in advance. If you don't receive a letter, please call our office to schedule the follow-up appointment.   Any Other Special Instructions Will Be Listed Below (If Applicable).     If you need a refill on your cardiac medications before your next appointment, please call your pharmacy.

## 2015-07-27 NOTE — Progress Notes (Signed)
Patient ID: [name], male   DOB: [birth year], 50 y.o.   MRN: [id number]     Cardiology Office Note   Date:  07/27/2015   ID:  [name], DOB Jun 16, 1965, MRN [id number]  PCP:  Orpah Melter, MD    No chief complaint on file. f/u mitral regurgitation   Wt Readings from Last 3 Encounters:  07/27/15 200 lb (90.719 kg)  01/11/15 196 lb 6.4 oz (89.086 kg)  08/30/14 196 lb (88.905 kg)       History of Present Illness: [name] is a 50 y.o. male  who has a family h/o CAD at a young age. He has had HTN for over 10 years. BP is usually controlled on meds. He had lipoma removal and then a skin infection in 2015. He was given an ABx and then he continued to feel poorly. 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 He completed a 6 week course of antibiotics to treat enterococcus in his blood. He was evaluated by cardiac surgery as well. He had moderate mitral regurgitation noted on his echocardiogram in January 2016. He is being managed medically.  He is back to working out regularly. He does cardio 45 minutes total b/w bike and elliptical and treadmill. He tries to do something every other day. He is doing weights. He has had knee issues. No CP, SHOB. He takes asthma medicine, related to allergies. Pulse during workouts go to 135-150. No problems with exercise. Increasing stamina.   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  Family history includes father with CABG, bilateral CEA. CABG at about 49. Mother as low BP and thyroid cancer. Sister has not had problems. Paternal GM with stroke.   Most recent echo in October 2016 and showed moderate MR with EF 45-50%. He continues to feel well. No further appts with CT surgery planned.  He did go on a long plane ride. He had some transient left leg swelling. It resolved within a day after the plane flight. He  does try to stay hydrated and move around while on long trips.     Past Medical History  Diagnosis Date  . Diabetes mellitus without complication ([place])     type 2  . Hyperlipidemia   . Allergic rhinitis   . lipoma   . H/O scoliosis   . Hypertension     Diagnostic exercise tolerance test assessment:04/30/2010 : comments normal -no evidence os ischemia by ST analysis  . Solitary kidney   . Wilm's tumor (ne[facility]roblastoma) [facility])     Past Surgical History  Procedure Laterality Date  . Lipoma excision    . Total nephrectomy Left   . Thyroid surgery    . Knee surgery       Current Outpatient Prescriptions  Medication Sig Dispense Refill  . albuterol (PROVENTIL HFA;VENTOLIN HFA) 108 (90 BASE) MCG/ACT inhaler Inhale 1 puff into the lungs every 6 (six) hours as needed for wheezing or shortness of breath.    Marland Kitchen aspirin EC 81 MG tablet Take 81 mg by mouth daily.    Marland Kitchen atorvastatin (LIPITOR) 10 MG tablet Take 1 tablet (10 mg total) by mouth daily. 30 tablet 1  . lisinopril (PRINIVIL,ZESTRIL) 2.5 MG tablet Take 1 tablet (2.5 mg total) by mouth daily. 30 tablet 1  . metFORMIN (GLUCOPHAGE) 500 MG tablet Take 500 mg by mouth 2 (two) times daily.    . mometasone [facility])  220 MCG/INH inhaler Inhale 1 puff into the lungs daily.    . repaglinide (PRANDIN) 0.5 MG tablet Take 0.5 mg by mouth 2 (two) times daily before a meal.    . TOPROL XL 25 MG 24 hr tablet Take 25 mg by mouth daily.     . ONE TOUCH ULTRA TEST test strip 1 each by Other route as needed (GLUCOSE).      No current facility-administered medications for this visit.    Allergies:   Review of patient's allergies indicates no known allergies.    Social History:  The patient  reports that he has never smoked. He has never used smokeless tobacco. He reports that he does not drink alcohol or use illicit drugs.   Family History:  The patient's family history includes Cancer in his mother; Healthy in his sister; Heart attack in his  maternal grandfather and paternal grandfather; Heart disease in his father; Hernia in his father; Hypotension in his mother.    ROS:  Please see the history of present illness.   Otherwise, review of systems are positive for Swelling as noted above.   All other systems are reviewed and negative.    PHYSICAL EXAM: VS:  BP 100/68 mmHg  Pulse 71  Ht 6' (1.829 m)  Wt 200 lb (90.719 kg)  BMI 27.12 kg/m2 , BMI Body mass index is 27.12 kg/(m^2). GEN: Well nourished, well developed, in no acute distress HEENT: normal Neck: no JVD, carotid bruits, or masses Cardiac: RRR; soft, early systolic murmur, no rubs, or gallops,no edema  Respiratory:  clear to auscultation bilaterally, normal work of breathing GI: soft, nontender, nondistended, + BS MS: no deformity or atrophy Skin: warm and dry, no rash Neuro:  Strength and sensation are intact Psych: euthymic mood, full affect   EKG:   The ekg ordered today demonstrates NSR, no ST segment changes   Recent Labs: 01/04/2015: ALT 26; BUN 19; Creat 1.07; Hemoglobin 15.4; Platelets 144*; Potassium 4.5; Sodium 136   Lipid Panel    Component Value Date/Time   CHOL 116 03/05/2014 0630   TRIG 122 03/05/2014 0630   HDL 21* 03/05/2014 0630   CHOLHDL 5.5 03/05/2014 0630   VLDL 24 03/05/2014 0630   LDLCALC 71 03/05/2014 0630     Other studies Reviewed: Additional studies/ records that were reviewed today with results demonstrating: Echo reviewed as noted above.   ASSESSMENT AND PLAN:  1.  Mitral valve disease: Moderate mitral regurgitation with stable left ventricular function. Will repeat echocardiogram in 2018, unless sx arise. We'll see the patient back in about 6 months. 2. Family history of CAD: Several members of his family with heart disease. His hypertension and hyperlipidemia are well controlled. LDL from December 2015 was 71.  Followed with PMD. 3. Diabetes: Managed by his primary care physician. A1c was well controlled. 4. Stressed  the importance of SBE prophylaxis with dental work given his prior endocarditis.    Current medicines are reviewed at length with the patient today.  The patient concerns regarding his medicines were addressed.  The following changes have been made:  No change  Labs/ tests ordered today include:  Orders Placed This Encounter  Procedures  . EKG 12-Lead    Recommend 150 minutes/week of aerobic exercise Low fat, low carb, high fiber diet recommended  Disposition:   FU in 6 months   Teresita Madura., MD  07/27/2015 9:46 AM    [place], [place]  [address] Phone: [phone]; Fax: [phone]

## 2016-02-01 DIAGNOSIS — Z23 Encounter for immunization: Secondary | ICD-10-CM | POA: Diagnosis not present

## 2016-02-01 DIAGNOSIS — E119 Type 2 diabetes mellitus without complications: Secondary | ICD-10-CM | POA: Diagnosis not present

## 2016-02-01 DIAGNOSIS — K4090 Unilateral inguinal hernia, without obstruction or gangrene, not specified as recurrent: Secondary | ICD-10-CM | POA: Diagnosis not present

## 2016-02-01 DIAGNOSIS — I10 Essential (primary) hypertension: Secondary | ICD-10-CM | POA: Diagnosis not present

## 2016-02-01 DIAGNOSIS — E7800 Pure hypercholesterolemia, unspecified: Secondary | ICD-10-CM | POA: Diagnosis not present

## 2016-02-15 ENCOUNTER — Encounter: Payer: Self-pay | Admitting: Surgery

## 2016-02-15 ENCOUNTER — Ambulatory Visit: Payer: Self-pay | Admitting: Surgery

## 2016-02-15 DIAGNOSIS — K4021 Bilateral inguinal hernia, without obstruction or gangrene, recurrent: Secondary | ICD-10-CM | POA: Diagnosis not present

## 2016-02-15 NOTE — H&P (Signed)
[name] 02/15/2016 2:03 PM Location: [facility] Patient #: [id number] DOB: 1965/06/25 Married / Language: English / Race: White Male  History of Present Illness ([name] A. Kae Heller MD; 02/15/2016 3:47 PM) Patient words: This is a very pleasant 50 year old gentleman with a very complex medical and surgical history who presents with a left inguinal hernia which he noticed 2-3 weeks ago while dancing at a foo fighters concert. He really hasn't bothered him much since then. He continues to exercise. Denies any nausea, vomiting, change in bowel function or urinary function. He notices a bulge sometimes old working out and is easily able to reduce it.  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 His musculature on the left side of his back is atrophic secondary to this. He also had an incarcerated inguinal hernia (he does not know which side) as an infant that required repair. He has numerous lipomas and has had many of these excised. He's had a subtotal thyroidectomy for "precancerous lesions" He also has a history of diabetes as well as hypertension and hyperlipidemia. Most recently in November 2015, he was treated for mitral valve endocarditis with significant enterococcal sepsis and a prolonged hospitalization as well as long-term IV antibiotics. He had cerebral emboli and he describes emboli to his back as well.  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 He has never smoked and he drinks alcohol on rare occasion, denies recreational drug use. He is married and has 1 son. 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  The patient is a 50 year old male.   Other Problems (April Staton, CMA; 02/15/2016 2:40 PM) Asthma Back  Pain Cancer High blood pressure Inguinal Hernia Other disease, cancer, significant illness  Past Surgical History (April Staton, CMA; 02/15/2016 2:40 PM) Knee Surgery Left. Nephrectomy Left. Thyroid Surgery Tonsillectomy  Diagnostic Studies History (April Staton, [place]; 02/15/2016 2:40 PM) Colonoscopy never  Allergies (April Staton, [place]; 02/15/2016 2:42 PM) No Known Drug Allergies 02/15/2016  Medication History (April Staton, [place]; 02/15/2016 2:44 PM) ProAir HFA (108 (90 Base)MCG/ACT Aerosol Soln, Inhalation) Active. Atorvastatin Calcium (10MG  Tablet, Oral) Active. Lisinopril (2.5MG  Tablet, Oral) Active. MetFORMIN HCl (500MG  Tablet, Oral) Active. Asmanex 30 Metered Doses (220MCG/INH Aero Pow Br Act, Inhalation) Active. OneTouch Ultra Blue (In Vitro) Active. Repaglinide (0.5MG  Tablet, Oral) Active. Toprol XL (25MG  Tablet ER 24HR, Oral) Active. Aspirin (81MG  Tablet, Oral) Active. Medications Reconciled  Social History (April Staton, CMA; 02/15/2016 2:40 PM) Alcohol use Occasional alcohol use. No caffeine use No drug use Tobacco use Never smoker.  Family History (April Staton, [place]; 02/15/2016 2:40 PM) Heart Disease Father. Hypertension Father. Thyroid problems Mother.     Review of Systems (April Staton CMA; 02/15/2016 2:40 PM) General Not Present- Appetite Loss, Chills, Fatigue, Fever, Night Sweats, Weight Gain and Weight Loss. Skin Not Present- Change in Wart/Mole, Dryness, Hives, Jaundice, New Lesions, Non-Healing Wounds, Rash and Ulcer. HEENT Present- Seasonal Allergies. Not Present- Earache, Hearing Loss, Hoarseness, Nose Bleed, Oral Ulcers, Ringing in the Ears, Sinus Pain, Sore Throat, Visual Disturbances, Wears glasses/contact lenses and Yellow Eyes. Respiratory Not Present- Bloody sputum, Chronic Cough, Difficulty Breathing, Snoring and Wheezing. Breast Not Present- Breast Mass, Breast Pain, Nipple Discharge and Skin  Changes. Cardiovascular Present- Leg Cramps. Not Present- Chest Pain, Difficulty Breathing Lying Down, Palpitations, Rapid Heart  Rate, Shortness of Breath and Swelling of Extremities. Gastrointestinal Not Present- Abdominal Pain, Bloating, Bloody Stool, Change in Bowel Habits, Chronic diarrhea, Constipation, Difficulty Swallowing, Excessive gas, Gets full quickly at meals, Hemorrhoids, Indigestion, Nausea, Rectal Pain and Vomiting. Male Genitourinary Not Present- Blood in Urine, Change in Urinary Stream, Frequency, Impotence, Nocturia, Painful Urination, Urgency and Urine Leakage. Musculoskeletal Present- Back Pain. Not Present- Joint Pain, Joint Stiffness, Muscle Pain, Muscle Weakness and Swelling of Extremities. Neurological Not Present- Decreased Memory, Fainting, Headaches, Numbness, Seizures, Tingling, Tremor, Trouble walking and Weakness. Psychiatric Not Present- Anxiety, Bipolar, Change in Sleep Pattern, Depression, Fearful and Frequent crying. Endocrine Not Present- Cold Intolerance, Excessive Hunger, Hair Changes, Heat Intolerance, Hot flashes and New Diabetes. Hematology Not Present- Blood Thinners, Easy Bruising, Excessive bleeding, Gland problems, HIV and Persistent Infections.  Vitals (April Staton CMA; 02/15/2016 2:45 PM) 02/15/2016 2:44 PM Weight: 203.38 lb Height: 72in Body Surface Area: 2.15 m Body Mass Index: 27.58 kg/m  Temp.: 98.85F(Oral)  Pulse: 75 (Regular)  P.OX: 95% (Room air) BP: 132/82 (Sitting, Left Arm, Standard)      Physical Exam (Edythe Riches A. Kae Heller MD; 02/15/2016 3:48 PM)  The physical exam findings are as follows: Note:He is alert and oriented 3 in no acute distress Extra ocular motion intact, anicteric, moist mucous membranes Neck without mass or lymphadenopathy Unlabored respirations, clear bilaterally Regular rate and rhythm with palpable distal pulses Abdomen is soft nontender nondistended there is no umbilical hernia. There is a left  greater than right inguinal hernia. He also has multiple lipomas throughout the abdominal wall. There is a transverse scar across the upper abdomen. Extremities are warm and well-perfused no deformity Neuro grossly intact, normal gait Psych normal mood and affect    Assessment & Plan (Sela Falk A. Traivon Morrical MD; 02/15/2016 3:51 PM)  BILATERAL RECURRENT INGUINAL HERNIA WITHOUT OBSTRUCTION OR GANGRENE (Principal Diagnosis) (K40.21) Story: He has bilateral internal hernias, one of which is recurrent from a repair as an infant. We discussed laparoscopic repair of both sides. Discussed risk of bleeding, infection, pain, scarring, hernia recurrence, injury to intra-abdominal structures. Also discussed the risk that he may have some intra-abdominal adhesions given history of open nephrectomy followed by radiation. If laparoscopic repair were not feasible we will proceed with open repair of the left side only as the right side is asymptomatic. He asked appropriate questions and is agreeable to proceed, hopefully at some point in late December.

## 2016-04-02 ENCOUNTER — Telehealth: Payer: Self-pay | Admitting: Interventional Cardiology

## 2016-04-02 MED ORDER — AMOXICILLIN 500 MG PO TABS: ORAL_TABLET | ORAL | 0 refills | Status: DC

## 2016-04-02 NOTE — Telephone Encounter (Signed)
[name] is calling because he is schedule to have surgery on 04/23/16 for a double hernia  and he was told as precautionary that he should treat any procedures that he has as if he has a artificial heart valve .  He is wanting to know does that still hold true for this outpatient procedure . Please call .   Thanks

## 2016-04-02 NOTE — Telephone Encounter (Signed)
**Note De-identified [name] Obfuscation** LMTCB

## 2016-04-02 NOTE — Telephone Encounter (Signed)
**Note De-Identified Rawson Minix Obfuscation** The pt is advised and he verbalized understanding. Per his request I have [facility] to fill.

## 2016-04-02 NOTE — Telephone Encounter (Signed)
Patient had bacteremia and endocarditis after having lipoma removal surgery several years ago.  Given risk of bacteremia with hernia surgery, would have him take 2000 mg of amoxicillin 1 hour prior to surgery.

## 2016-04-18 NOTE — Pre-Procedure Instructions (Signed)
    Cornie Burck  04/18/2016      [facility] - [place], [address] [place] [address] Phone: [phone] Fax: [phone]  [id number] [place], [address] [place] [address] Phone: [phone] Fax: [phone]  [id number] [place], [address] [place] [address] Phone: [phone] Fax: [phone]    Your procedure is scheduled on January 24th, Wednesday   Report to [facility] Admitting at 8:00 AM   Call this number if you have problems the morning of surgery:  [facility] is NOT open on weekends.   Remember:  Do not eat food or drink liquids after midnight Tuesday.   Take these medicines the morning of surgery with A SIP OF WATER :  Toprol xl.  Please use your inhaler that morning.             You will NOT be taking your diabetes medication the morning of surgery.              4-5 days prior to surgery, STOP TAKING any Vitamins, Herbal Supplements, Anti-inflammatories   Do not wear jewelry - no rings or watches.  Do not wear lotions, colognes or deoderant.              Men may shave face and neck.  Do not bring valuables to the hospital.  [facility] is not responsible for any belongings or valuables.  Contacts, dentures or bridgework may not be worn into surgery.  Leave your suitcase in the car.  After surgery it may be brought to your room. For patients admitted to the hospital, discharge time will be determined by your treatment team.   Please read over the following fact sheets that you were given. Pain Booklet and Surgical Site Infection Prevention

## 2016-04-21 ENCOUNTER — Ambulatory Visit (HOSPITAL_COMMUNITY): Admission: RE | Admit: 2016-04-21 | Payer: BLUE CROSS/BLUE SHIELD | Source: Ambulatory Visit

## 2016-04-21 ENCOUNTER — Encounter (HOSPITAL_COMMUNITY)
Admission: RE | Admit: 2016-04-21 | Discharge: 2016-04-21 | Disposition: A | Discharge status: Home / Self Care | Payer: BLUE CROSS/BLUE SHIELD | Source: Ambulatory Visit | Attending: Surgery | Admitting: Surgery

## 2016-04-21 ENCOUNTER — Encounter (HOSPITAL_COMMUNITY): Payer: Self-pay

## 2016-04-21 DIAGNOSIS — Z7984 Long term (current) use of oral hypoglycemic drugs: Secondary | ICD-10-CM | POA: Diagnosis not present

## 2016-04-21 DIAGNOSIS — E119 Type 2 diabetes mellitus without complications: Secondary | ICD-10-CM | POA: Diagnosis not present

## 2016-04-21 DIAGNOSIS — E785 Hyperlipidemia, unspecified: Secondary | ICD-10-CM | POA: Diagnosis not present

## 2016-04-21 DIAGNOSIS — Z01810 Encounter for preprocedural cardiovascular examination: Secondary | ICD-10-CM

## 2016-04-21 DIAGNOSIS — D176 Benign lipomatous neoplasm of spermatic cord: Secondary | ICD-10-CM | POA: Diagnosis not present

## 2016-04-21 DIAGNOSIS — J45909 Unspecified asthma, uncomplicated: Secondary | ICD-10-CM | POA: Diagnosis not present

## 2016-04-21 DIAGNOSIS — K4090 Unilateral inguinal hernia, without obstruction or gangrene, not specified as recurrent: Secondary | ICD-10-CM | POA: Diagnosis not present

## 2016-04-21 DIAGNOSIS — Z01818 Encounter for other preprocedural examination: Secondary | ICD-10-CM | POA: Insufficient documentation

## 2016-04-21 DIAGNOSIS — Z7982 Long term (current) use of aspirin: Secondary | ICD-10-CM | POA: Diagnosis not present

## 2016-04-21 DIAGNOSIS — Z01812 Encounter for preprocedural laboratory examination: Secondary | ICD-10-CM | POA: Insufficient documentation

## 2016-04-21 DIAGNOSIS — I10 Essential (primary) hypertension: Secondary | ICD-10-CM | POA: Diagnosis not present

## 2016-04-21 DIAGNOSIS — Z79899 Other long term (current) drug therapy: Secondary | ICD-10-CM | POA: Diagnosis not present

## 2016-04-21 HISTORY — DX: Unspecified asthma, uncomplicated: J45.909

## 2016-04-21 HISTORY — DX: Personal history of other diseases of the digestive system: Z87.19

## 2016-04-21 HISTORY — DX: Rheumatic mitral valve disease, unspecified: I05.9

## 2016-04-21 HISTORY — DX: Other rheumatic mitral valve diseases: I05.8

## 2016-04-21 HISTORY — DX: Discitis, unspecified, lumbosacral region: M46.47

## 2016-04-21 LAB — COMPREHENSIVE METABOLIC PANEL
ALBUMIN: 3.8 g/dL (ref 3.5–5.0)
ALT: 29 U/L (ref 17–63)
AST: 27 U/L (ref 15–41)
Alkaline Phosphatase: 37 U/L — ABNORMAL LOW (ref 38–126)
Anion gap: 9 (ref 5–15)
BUN: 24 mg/dL — AB (ref 6–20)
CHLORIDE: 103 mmol/L (ref 101–111)
CO2: 26 mmol/L (ref 22–32)
Calcium: 9.1 mg/dL (ref 8.9–10.3)
Creatinine, Ser: 1.19 mg/dL (ref 0.61–1.24)
GFR calc Af Amer: >60 mL/min (ref >60)
GFR calc non Af Amer: >60 mL/min (ref >60)
Glucose, Bld: 119 mg/dL — ABNORMAL HIGH (ref 65–99)
POTASSIUM: 4.5 mmol/L (ref 3.5–5.1)
SODIUM: 138 mmol/L (ref 135–145)
Total Bilirubin: 1 mg/dL (ref 0.3–1.2)
Total Protein: 6.6 g/dL (ref 6.5–8.1)

## 2016-04-21 LAB — CBC WITH DIFFERENTIAL/PLATELET
BASOS ABS: 0 10*3/uL (ref 0.0–0.1)
BASOS PCT: 1 %
EOS ABS: 0.2 10*3/uL (ref 0.0–0.7)
EOS PCT: 4 %
HCT: 44.9 % (ref 39.0–52.0)
Hemoglobin: 15.5 g/dL (ref 13.0–17.0)
Lymphocytes Relative: 27 %
Lymphs Abs: 1.8 10*3/uL (ref 0.7–4.0)
MCH: 31.9 pg (ref 26.0–34.0)
MCHC: 34.5 g/dL (ref 30.0–36.0)
MCV: 92.4 fL (ref 78.0–100.0)
MONO ABS: 0.5 10*3/uL (ref 0.1–1.0)
Monocytes Relative: 7 %
Neutro Abs: 4.1 10*3/uL (ref 1.7–7.7)
Neutrophils Relative %: 61 %
PLATELETS: 133 10*3/uL — AB (ref 150–400)
RBC: 4.86 MIL/uL (ref 4.22–5.81)
RDW: 12.9 % (ref 11.5–15.5)
WBC: 6.6 10*3/uL (ref 4.0–10.5)

## 2016-04-21 LAB — APTT: APTT: 25 s (ref 24–36)

## 2016-04-21 LAB — PROTIME-INR
INR: 1.02
PROTHROMBIN TIME: 13.5 s (ref 11.4–15.2)

## 2016-04-21 LAB — GLUCOSE, CAPILLARY
GLUCOSE-CAPILLARY: 113 mg/dL — AB (ref 65–99)
GLUCOSE-CAPILLARY: 176 mg/dL — AB (ref 65–99)

## 2016-04-21 NOTE — Progress Notes (Addendum)
[name] is [name]  LOV 06/2015 Patient had lipoma removed, then went on international business trip 2015, developed strep infection 11/2013  Ended up with endocarditis. Stayed here at [facility] for a month or so.  Dr. Johnnye Sima is infectious disease doctor.  Now has some mitral leakage, but doing well.  Denies any chest pains, sob. Hx of Wilm's tumor.  Only has right kidney. Nov. 2017 A1C was 6.7 Had pre cancerous cell  On  Thyroid ( with removal) by Dr. Madison Hickman yrs ago. Will need CXR dos... Pt had to leave.

## 2016-04-22 LAB — HEMOGLOBIN A1C
Hgb A1c MFr Bld: 6.6 % — ABNORMAL HIGH (ref 4.8–5.6)
Mean Plasma Glucose: 143 mg/dL

## 2016-04-22 NOTE — Progress Notes (Signed)
Anesthesia chart review: Patient is a 51 year old male scheduled for laparoscopic bilateral inguinal hernia repairs, possible open left inguinal hernia repair on 04/23/2016 by Dr. Kae Heller.  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  He was hospitalized 02/26/14-03/07/14 with severe sepsis with enterococcus mitral valve endocarditis, cardiomyopathy, diskitis/osteomyelitis L3-4. He presented with progressive back pain and 5-6 weeks of low grade fever with recent overseas travel and lipoma excisions (11/2013). Lumbar MRI showed L3-4 disc protrusion. Neurosurgery did not recommend any acute surgical intervention. On 03/01/14 he developed high-grade fever, seizures, acute respiratory distress requiring intubation with mechanical ventilation. MRI of the brain showed multiple small areas of acute bilateral infarct (septic emboli). TEE showed moderate MR with posterior leaflet mitral valve endocarditis and depressed EF 35%. Neurology, CT surgery, cardiology, and ID were consulted. Antibiotic therapy adjusted. EEG was consistent with encephalopathy. MRA was negative for mycotic aneurysm. Follow-up echo showed EF improved to 65% with moderate MR. Cardiomyopathy felt non-ischemic in the setting of acute/subacute endocarditis. Follow-up lumbar MRI showed early diskitis versus osteomyelitis over L3-4 which was treated with antibiotics as well. (Ceftriaxone and Ampicillin thru 04/15/14 followed by 3 months of Augmentin.)  - PCP is Dr. Orpah Melter. - Cardiologist is Dr. Irish Lack, last visit 07/27/15.Next visit scheduled 05/02/16. Dr. Irish Lack recommended since patient developed bacteremia with endocarditis shortly after  undergoing lipoma excision, that he should take amoxicillin 2000 mg 1 hour prior to surgery. He will plan to update patient's echo sometime in 2018. As of patient's last visit, he was doing cardio 45 minutes total about every other days with no chest pain or SOB. No issues with exercise or stamina. - ID is Dr. Johnnye Sima. PRN follow-up recommended after 08/30/14 visit. - CT surgeon is Dr. Servando Snare. PRN follow-up recommended after 07/31/14 visit. - Neurosurgeon is Dr. Kathyrn Sheriff.  Meds include albuterol, aspirin 81 mg, Lipitor, Benadryl, lisinopril, metformin, Asmanex, fish oil, Prandin, Toprol-XL. Amoxicillin 2000 mg one hour prior to surgery.    BP (!) 150/83   Pulse 72   Temp 36.7 C   Resp 20   Ht 5\' 11"  (1.803 m)   Wt 210 lb 3.2 oz (95.3 kg)   SpO2 98%   BMI 29.32 kg/m   EKG 07/27/15: NSR.  Echo 01/04/15: Study Conclusions - Left ventricle: The cavity size was normal. Wall thickness was   increased in a pattern of mild LVH. Systolic function was mildly   reduced. The estimated ejection fraction was in the range of 45%   to 50%. Mild diffuse hypokinesis. Left ventricular diastolic   function parameters were normal. - Ascending aorta: The ascending aorta was mildly dilated. 3.7 cm. - Mitral valve: Mild prolapse of both the anterior and posterior   leaflets. There was moderate regurgitation directed posteriorly. - Right ventricle: The cavity size was mildly dilated. Wall   thickness was normal. Systolic function was normal. - Tricuspid valve: There was trivial regurgitation. - Pulmonic valve: There was mild regurgitation. (Comparison echo: 08/18/14 and 04/18/14 LVEF 50-55%, moderate MR; 03/22/14 EF 55-60%, mild, myxomatous MV with mild MVP, small posterior leaflet vegetation, moderate MR; TEE 03/01/14 EF 35% with global hypokinesis and dyskinesis of the septum, large echodensity on the P2 scallop of the posterior leaflet of the mitral valve, partially involving P1 and P3 scallops as well. The  echodensity measures  1 x 1.8 cm and appears to have translucent area in the middle suspicious for an abscess. There are multiple jets of mitral regurgitation, estimated MR is at least moderate, there is reversal of flow in the right pulmonary upper vein but not left upper PV. No ASD or PFO.)  Carotid U/S 03/05/14: Summary: Findings suggest 1-39% internal carotid artery stenosis bilaterally. Vertebral arteries are patent with antegrade flow.  Preoperative labs noted. Cr 1.19. H/H 15.5/44.9, PLT 133. PT/PTT WNL. Glucose 119, A1c 6.6.  Cardiology is aware of surgery plans. Preoperative labs acceptable for OR. If no acute changes then I would anticipate that he could proceed as planned.  George Hugh [facility] Stay [facility]/Anesthesiology Phone [phone] 04/22/2016 10:27 AM

## 2016-04-23 ENCOUNTER — Encounter (HOSPITAL_COMMUNITY)
Admission: RE | Disposition: A | Discharge status: Home / Self Care | Payer: Self-pay | Source: Ambulatory Visit | Attending: Surgery

## 2016-04-23 ENCOUNTER — Ambulatory Visit (HOSPITAL_COMMUNITY): Payer: BLUE CROSS/BLUE SHIELD

## 2016-04-23 ENCOUNTER — Encounter (HOSPITAL_COMMUNITY): Payer: Self-pay | Admitting: *Deleted

## 2016-04-23 ENCOUNTER — Ambulatory Visit (HOSPITAL_COMMUNITY)
Admission: RE | Admit: 2016-04-23 | Discharge: 2016-04-23 | Disposition: A | Discharge status: Home / Self Care | Payer: BLUE CROSS/BLUE SHIELD | Source: Ambulatory Visit | Attending: Surgery | Admitting: Surgery

## 2016-04-23 ENCOUNTER — Ambulatory Visit (HOSPITAL_COMMUNITY): Payer: BLUE CROSS/BLUE SHIELD | Admitting: Anesthesiology

## 2016-04-23 ENCOUNTER — Ambulatory Visit (HOSPITAL_COMMUNITY): Payer: BLUE CROSS/BLUE SHIELD | Admitting: Emergency Medicine

## 2016-04-23 DIAGNOSIS — Z7984 Long term (current) use of oral hypoglycemic drugs: Secondary | ICD-10-CM | POA: Insufficient documentation

## 2016-04-23 DIAGNOSIS — Z01818 Encounter for other preprocedural examination: Secondary | ICD-10-CM | POA: Diagnosis not present

## 2016-04-23 DIAGNOSIS — J45909 Unspecified asthma, uncomplicated: Secondary | ICD-10-CM | POA: Insufficient documentation

## 2016-04-23 DIAGNOSIS — Z7982 Long term (current) use of aspirin: Secondary | ICD-10-CM | POA: Insufficient documentation

## 2016-04-23 DIAGNOSIS — I10 Essential (primary) hypertension: Secondary | ICD-10-CM | POA: Insufficient documentation

## 2016-04-23 DIAGNOSIS — K4090 Unilateral inguinal hernia, without obstruction or gangrene, not specified as recurrent: Secondary | ICD-10-CM | POA: Diagnosis not present

## 2016-04-23 DIAGNOSIS — E119 Type 2 diabetes mellitus without complications: Secondary | ICD-10-CM | POA: Insufficient documentation

## 2016-04-23 DIAGNOSIS — Z79899 Other long term (current) drug therapy: Secondary | ICD-10-CM | POA: Insufficient documentation

## 2016-04-23 DIAGNOSIS — D176 Benign lipomatous neoplasm of spermatic cord: Secondary | ICD-10-CM | POA: Insufficient documentation

## 2016-04-23 DIAGNOSIS — E785 Hyperlipidemia, unspecified: Secondary | ICD-10-CM | POA: Insufficient documentation

## 2016-04-23 HISTORY — DX: Nausea with vomiting, unspecified: R11.2

## 2016-04-23 HISTORY — PX: INGUINAL HERNIA REPAIR: SHX194

## 2016-04-23 HISTORY — DX: Other specified postprocedural states: Z98.890

## 2016-04-23 LAB — GLUCOSE, CAPILLARY
GLUCOSE-CAPILLARY: 138 mg/dL — AB (ref 65–99)
Glucose-Capillary: 149 mg/dL — ABNORMAL HIGH (ref 65–99)
Glucose-Capillary: 146 mg/dL — ABNORMAL HIGH (ref 65–99)

## 2016-04-23 SURGERY — REPAIR, HERNIA, INGUINAL, LAPAROSCOPIC
Anesthesia: General | Site: Inguinal | Laterality: Left

## 2016-04-23 MED ORDER — FENTANYL CITRATE (PF) 100 MCG/2ML IJ SOLN
INTRAMUSCULAR | Status: DC | PRN
  Administered 2016-04-23: 25 ug via INTRAVENOUS
  Administered 2016-04-23 (×2): 50 ug via INTRAVENOUS
  Administered 2016-04-23: 25 ug via INTRAVENOUS
  Administered 2016-04-23: 50 ug via INTRAVENOUS
  Administered 2016-04-23: 25 ug via INTRAVENOUS
  Administered 2016-04-23: 50 ug via INTRAVENOUS

## 2016-04-23 MED ORDER — PROPOFOL 10 MG/ML IV BOLUS
INTRAVENOUS | Status: DC | PRN
  Administered 2016-04-23: 40 mg via INTRAVENOUS
  Administered 2016-04-23: 160 mg via INTRAVENOUS

## 2016-04-23 MED ORDER — LIDOCAINE 2% (20 MG/ML) 5 ML SYRINGE
INTRAMUSCULAR | Status: AC
  Filled 2016-04-23: qty 5

## 2016-04-23 MED ORDER — FENTANYL CITRATE (PF) 100 MCG/2ML IJ SOLN
INTRAMUSCULAR | Status: AC
  Filled 2016-04-23: qty 4

## 2016-04-23 MED ORDER — MIDAZOLAM HCL 5 MG/5ML IJ SOLN
INTRAMUSCULAR | Status: DC | PRN
  Administered 2016-04-23: 2 mg via INTRAVENOUS

## 2016-04-23 MED ORDER — ONDANSETRON HCL 4 MG/2ML IJ SOLN
INTRAMUSCULAR | Status: DC | PRN
  Administered 2016-04-23: 4 mg via INTRAVENOUS

## 2016-04-23 MED ORDER — FENTANYL CITRATE (PF) 100 MCG/2ML IJ SOLN
INTRAMUSCULAR | Status: AC
  Filled 2016-04-23: qty 2

## 2016-04-23 MED ORDER — GLYCOPYRROLATE 0.2 MG/ML IJ SOLN
INTRAMUSCULAR | Status: DC | PRN
  Administered 2016-04-23: 0.6 mg via INTRAVENOUS

## 2016-04-23 MED ORDER — ACETAMINOPHEN 650 MG RE SUPP: 650.0000 mg | RECTAL | Status: DC | PRN

## 2016-04-23 MED ORDER — PROPOFOL 10 MG/ML IV BOLUS
INTRAVENOUS | Status: AC
  Filled 2016-04-23: qty 40

## 2016-04-23 MED ORDER — CHLORHEXIDINE GLUCONATE CLOTH 2 % EX PADS: 6.0000 | MEDICATED_PAD | Freq: Once | CUTANEOUS | Status: DC

## 2016-04-23 MED ORDER — LIDOCAINE HCL (CARDIAC) 20 MG/ML IV SOLN
INTRAVENOUS | Status: DC | PRN
  Administered 2016-04-23: 60 mg via INTRAVENOUS

## 2016-04-23 MED ORDER — 0.9 % SODIUM CHLORIDE (POUR BTL) OPTIME
TOPICAL | Status: DC | PRN
  Administered 2016-04-23: 1000 mL

## 2016-04-23 MED ORDER — HYDROMORPHONE HCL 1 MG/ML IJ SOLN: 0.2500 mg | INTRAMUSCULAR | Status: DC | PRN

## 2016-04-23 MED ORDER — ROCURONIUM BROMIDE 50 MG/5ML IV SOSY
PREFILLED_SYRINGE | INTRAVENOUS | Status: AC
  Filled 2016-04-23: qty 10

## 2016-04-23 MED ORDER — LACTATED RINGERS IV SOLN
INTRAVENOUS | Status: DC
  Administered 2016-04-23 (×3): via INTRAVENOUS

## 2016-04-23 MED ORDER — SODIUM CHLORIDE 0.9% FLUSH: 3.0000 mL | INTRAVENOUS | Status: DC | PRN

## 2016-04-23 MED ORDER — NEOSTIGMINE METHYLSULFATE 10 MG/10ML IV SOLN
INTRAVENOUS | Status: DC | PRN
  Administered 2016-04-23: 4 mg via INTRAVENOUS

## 2016-04-23 MED ORDER — OXYCODONE-ACETAMINOPHEN 5-325 MG PO TABS: 1.0000 | ORAL_TABLET | Freq: Four times a day (QID) | ORAL | 0 refills | Status: DC | PRN

## 2016-04-23 MED ORDER — DOCUSATE SODIUM 100 MG PO CAPS: 100.0000 mg | ORAL_CAPSULE | Freq: Two times a day (BID) | ORAL | 0 refills | Status: AC

## 2016-04-23 MED ORDER — ONDANSETRON HCL 4 MG/2ML IJ SOLN
INTRAMUSCULAR | Status: AC
  Filled 2016-04-23: qty 2

## 2016-04-23 MED ORDER — CEFAZOLIN SODIUM-DEXTROSE 2-4 GM/100ML-% IV SOLN
2.0000 g | INTRAVENOUS | Status: AC
  Administered 2016-04-23: 2 g via INTRAVENOUS
  Filled 2016-04-23: qty 100

## 2016-04-23 MED ORDER — FENTANYL CITRATE (PF) 100 MCG/2ML IJ SOLN: 25.0000 ug | INTRAMUSCULAR | Status: DC | PRN

## 2016-04-23 MED ORDER — OXYCODONE HCL 5 MG/5ML PO SOLN: 5.0000 mg | Freq: Once | ORAL | Status: DC | PRN

## 2016-04-23 MED ORDER — ROCURONIUM BROMIDE 100 MG/10ML IV SOLN
INTRAVENOUS | Status: DC | PRN
  Administered 2016-04-23: 10 mg via INTRAVENOUS
  Administered 2016-04-23: 20 mg via INTRAVENOUS
  Administered 2016-04-23: 50 mg via INTRAVENOUS

## 2016-04-23 MED ORDER — SODIUM CHLORIDE 0.9 % IV SOLN: 250.0000 mL | INTRAVENOUS | Status: DC | PRN

## 2016-04-23 MED ORDER — NEOSTIGMINE METHYLSULFATE 5 MG/5ML IV SOSY
PREFILLED_SYRINGE | INTRAVENOUS | Status: AC
  Filled 2016-04-23: qty 5

## 2016-04-23 MED ORDER — SODIUM CHLORIDE 0.9% FLUSH: 3.0000 mL | Freq: Two times a day (BID) | INTRAVENOUS | Status: DC

## 2016-04-23 MED ORDER — MIDAZOLAM HCL 2 MG/2ML IJ SOLN
INTRAMUSCULAR | Status: AC
  Filled 2016-04-23: qty 2

## 2016-04-23 MED ORDER — BUPIVACAINE HCL (PF) 0.25 % IJ SOLN
INTRAMUSCULAR | Status: DC | PRN
  Administered 2016-04-23: 3 mL

## 2016-04-23 MED ORDER — SODIUM CHLORIDE 0.9 % IR SOLN
Status: DC | PRN
  Administered 2016-04-23: 1000 mL

## 2016-04-23 MED ORDER — ACETAMINOPHEN 325 MG PO TABS: 650.0000 mg | ORAL_TABLET | ORAL | Status: DC | PRN

## 2016-04-23 MED ORDER — BUPIVACAINE HCL (PF) 0.25 % IJ SOLN
INTRAMUSCULAR | Status: AC
  Filled 2016-04-23: qty 30

## 2016-04-23 MED ORDER — OXYCODONE HCL 5 MG PO TABS: 5.0000 mg | ORAL_TABLET | Freq: Once | ORAL | Status: DC | PRN

## 2016-04-23 MED ORDER — OXYCODONE HCL 5 MG PO TABS: 5.0000 mg | ORAL_TABLET | ORAL | Status: DC | PRN

## 2016-04-23 SURGICAL SUPPLY — 52 items
APPLIER CLIP LOGIC TI 5 (MISCELLANEOUS) ×3 IMPLANT
BENZOIN TINCTURE PRP APPL 2/3 (GAUZE/BANDAGES/DRESSINGS) ×3 IMPLANT
BLADE CLIPPER SURG (BLADE) ×3 IMPLANT
CANISTER SUCTION 2500CC (MISCELLANEOUS) ×3 IMPLANT
COVER SURGICAL LIGHT HANDLE (MISCELLANEOUS) ×3 IMPLANT
DECANTER SPIKE VIAL GLASS SM (MISCELLANEOUS) ×3 IMPLANT
DERMABOND ADVANCED (GAUZE/BANDAGES/DRESSINGS) ×1
DERMABOND ADVANCED .7 DNX12 (GAUZE/BANDAGES/DRESSINGS) ×2 IMPLANT
DEVICE SECURE STRAP 25 ABSORB (INSTRUMENTS) ×3 IMPLANT
DISSECT BALLN SPACEMKR + OVL (BALLOONS) ×3
DISSECTOR BALLN SPACEMKR + OVL (BALLOONS) ×2 IMPLANT
DISSECTOR BLUNT TIP ENDO 5MM (MISCELLANEOUS) IMPLANT
DRSG PAD ABDOMINAL 8X10 ST (GAUZE/BANDAGES/DRESSINGS) ×3 IMPLANT
DRSG TEGADERM 2-3/8X2-3/4 SM (GAUZE/BANDAGES/DRESSINGS) ×3 IMPLANT
DRSG TEGADERM 4X4.75 (GAUZE/BANDAGES/DRESSINGS) ×3 IMPLANT
ELECT REM PT RETURN 9FT ADLT (ELECTROSURGICAL) ×3
ELECTRODE REM PT RTRN 9FT ADLT (ELECTROSURGICAL) ×2 IMPLANT
GLOVE BIO SURGEON STRL SZ 6 (GLOVE) ×6 IMPLANT
GLOVE BIO SURGEON STRL SZ7 (GLOVE) ×3 IMPLANT
GLOVE BIO SURGEON STRL SZ8 (GLOVE) ×3 IMPLANT
GLOVE BIOGEL PI IND STRL 6.5 (GLOVE) ×4 IMPLANT
GLOVE BIOGEL PI IND STRL 7.0 (GLOVE) ×2 IMPLANT
GLOVE BIOGEL PI IND STRL 7.5 (GLOVE) ×2 IMPLANT
GLOVE BIOGEL PI IND STRL 8.5 (GLOVE) ×2 IMPLANT
GLOVE BIOGEL PI INDICATOR 6.5 (GLOVE) ×2
GLOVE BIOGEL PI INDICATOR 7.0 (GLOVE) ×1
GLOVE BIOGEL PI INDICATOR 7.5 (GLOVE) ×1
GLOVE BIOGEL PI INDICATOR 8.5 (GLOVE) ×1
GLOVE EUDERMIC 7 POWDERFREE (GLOVE) ×3 IMPLANT
GLOVE SURG SS PI 7.0 STRL IVOR (GLOVE) ×3 IMPLANT
GOWN STRL REUS W/ TWL LRG LVL3 (GOWN DISPOSABLE) ×6 IMPLANT
GOWN STRL REUS W/ TWL XL LVL3 (GOWN DISPOSABLE) ×4 IMPLANT
GOWN STRL REUS W/TWL LRG LVL3 (GOWN DISPOSABLE) ×3
GOWN STRL REUS W/TWL XL LVL3 (GOWN DISPOSABLE) ×2
KIT BASIN OR (CUSTOM PROCEDURE TRAY) ×3 IMPLANT
KIT ROOM TURNOVER OR (KITS) ×3 IMPLANT
MESH ULTRAPRO 3X6 7.6X15CM (Mesh General) ×3 IMPLANT
NS IRRIG 1000ML POUR BTL (IV SOLUTION) ×3 IMPLANT
PAD ARMBOARD 7.5X6 YLW CONV (MISCELLANEOUS) ×6 IMPLANT
SCISSORS LAP 5X35 DISP (ENDOMECHANICALS) IMPLANT
SET IRRIG TUBING LAPAROSCOPIC (IRRIGATION / IRRIGATOR) ×3 IMPLANT
SET TROCAR LAP APPLE-HUNT 5MM (ENDOMECHANICALS) ×3 IMPLANT
SLEEVE ENDOPATH XCEL 5M (ENDOMECHANICALS) ×3 IMPLANT
STRIP CLOSURE SKIN 1/2X4 (GAUZE/BANDAGES/DRESSINGS) ×3 IMPLANT
SUT MNCRL AB 4-0 PS2 18 (SUTURE) ×3 IMPLANT
TOWEL OR 17X24 6PK STRL BLUE (TOWEL DISPOSABLE) ×3 IMPLANT
TOWEL OR 17X26 10 PK STRL BLUE (TOWEL DISPOSABLE) ×3 IMPLANT
TRAY FOLEY CATH 16FR SILVER (SET/KITS/TRAYS/PACK) ×3 IMPLANT
TRAY LAPAROSCOPIC MC (CUSTOM PROCEDURE TRAY) ×3 IMPLANT
TROCAR BLADELESS 5MM (ENDOMECHANICALS) ×3 IMPLANT
TROCAR XCEL BLUNT TIP 100MML (ENDOMECHANICALS) ×3 IMPLANT
TUBING INSUFFLATION (TUBING) ×3 IMPLANT

## 2016-04-23 NOTE — Anesthesia Procedure Notes (Signed)
Procedure Name: Intubation Date/Time: 04/23/2016 11:35 AM Performed by: Scheryl Darter Pre-anesthesia Checklist: Patient identified, Emergency Drugs available, Suction available and Patient being monitored Patient Re-evaluated:Patient Re-evaluated prior to inductionOxygen Delivery Method: Circle System Utilized Preoxygenation: Pre-oxygenation with 100% oxygen Intubation Type: IV induction Ventilation: Mask ventilation without difficulty Tube type: Oral Number of attempts: 1 Airway Equipment and Method: Stylet and Oral airway Placement Confirmation: ETT inserted through vocal cords under direct vision,  positive ETCO2 and breath sounds checked- equal and bilateral Secured at: 23 cm Tube secured with: Tape Dental Injury: Teeth and Oropharynx as per pre-operative assessment

## 2016-04-23 NOTE — Transfer of Care (Signed)
Immediate Anesthesia Transfer of Care Note  Patient: [name]  Procedure(s) Performed: Procedure(s): LAPAROSCOPIC  INGUINAL REPAIR (Left)  Patient Location: PACU  Anesthesia Type:General  Level of Consciousness: awake, alert , oriented and sedated  Airway & Oxygen Therapy: Patient Spontanous Breathing and Patient connected to nasal cannula oxygen  Post-op Assessment: Report given to RN, Post -op Vital signs reviewed and stable and Patient moving all extremities  Post vital signs: Reviewed and stable  Last Vitals:  Vitals:   04/23/16 0819 04/23/16 1347  BP: (!) 145/86 (!) 147/89  Pulse: 64 89  Resp: 18   Temp: 36.6 C 36.8 C    Last Pain:  Vitals:   04/23/16 0819  TempSrc: Oral      Patients Stated Pain Goal: 5 ([id number])  Complications: No apparent anesthesia complications

## 2016-04-23 NOTE — H&P (Signed)
[name] Patient #: [id number] DOB: [birth year] Married / Language: [name] / Race: White Male  History of Present Illness Patient words: This is a very pleasant 51 year old gentleman with a very complex medical and surgical history who presents with a left inguinal hernia which he noticed 2-3 weeks ago while dancing at a foo fighters concert. He really hasn't bothered him much since then. He continues to exercise. Denies any nausea, vomiting, change in bowel function or urinary function. He notices a bulge sometimes while working out and is easily able to reduce it.  He has a history of a Wilms tumor as an infant and is status post left nephrectomy via an upper abdomen transverse incision; this was followed by radiation. His musculature on the left side of his back is atrophic secondary to this. He also had an incarcerated inguinal hernia (he does not know which side) as an infant that required repair. He has numerous lipomas and has had many of these excised. He's had a subtotal thyroidectomy for "precancerous lesions" He also has a history of diabetes as well as hypertension and hyperlipidemia. Most recently in November 2015, he was treated for mitral valve endocarditis with significant enterococcal sepsis and a prolonged hospitalization as well as long-term IV antibiotics. He had cerebral emboli and he describes emboli to his back as well.  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 He has never smoked and he drinks alcohol on rare occasion, denies recreational drug use. He is married and has 1 son. 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   Other Problems  Asthma Back Pain Cancer High blood pressure Inguinal Hernia Other disease, cancer, significant illness  Past Surgical History Knee Surgery Left. Nephrectomy Left. Thyroid  Surgery Tonsillectomy  Diagnostic Studies History  Colonoscopy never  Allergies  No Known Drug Allergies 02/15/2016  Medication History  ProAir HFA (108 (90 Base)MCG/ACT Aerosol Soln, Inhalation) Active. Atorvastatin Calcium (10MG  Tablet, Oral) Active. Lisinopril (2.5MG  Tablet, Oral) Active. MetFORMIN HCl (500MG  Tablet, Oral) Active. Asmanex 30 Metered Doses (220MCG/INH Aero Pow Br Act, Inhalation) Active. OneTouch Ultra Blue (In Vitro) Active. Repaglinide (0.5MG  Tablet, Oral) Active. Toprol XL (25MG  Tablet ER 24HR, Oral) Active. Aspirin (81MG  Tablet, Oral) Active.   Social History Alcohol use Occasional alcohol use. No caffeine use No drug use Tobacco use Never smoker.  Family History Heart Disease Father. Hypertension Father. Thyroid problems Mother.    Review of Systems General Not Present- Appetite Loss, Chills, Fatigue, Fever, Night Sweats, Weight Gain and Weight Loss. Skin Not Present- Change in Wart/Mole, Dryness, Hives, Jaundice, New Lesions, Non-Healing Wounds, Rash and Ulcer. HEENT Present- Seasonal Allergies. Not Present- Earache, Hearing Loss, Hoarseness, Nose Bleed, Oral Ulcers, Ringing in the Ears, Sinus Pain, Sore Throat, Visual Disturbances, Wears glasses/contact lenses and Yellow Eyes. Respiratory Not Present- Bloody sputum, Chronic Cough, Difficulty Breathing, Snoring and Wheezing. Breast Not Present- Breast Mass, Breast Pain, Nipple Discharge and Skin Changes. Cardiovascular Present- Leg Cramps. Not Present- Chest Pain, Difficulty Breathing Lying Down, Palpitations, Rapid Heart Rate, Shortness of Breath and Swelling of Extremities. Gastrointestinal Not Present- Abdominal Pain, Bloating, Bloody Stool, Change in Bowel Habits, Chronic diarrhea, Constipation, Difficulty Swallowing, Excessive gas, Gets full quickly at meals, Hemorrhoids, Indigestion, Nausea, Rectal Pain and Vomiting. Male Genitourinary Not Present- Blood in Urine,  Change in Urinary Stream, Frequency, Impotence, Nocturia, Painful Urination, Urgency and Urine Leakage. Musculoskeletal Present- Back Pain. Not Present- Joint Pain,  Joint Stiffness, Muscle Pain, Muscle Weakness and Swelling of Extremities. Neurological Not Present- Decreased Memory, Fainting, Headaches, Numbness, Seizures, Tingling, Tremor, Trouble walking and Weakness. Psychiatric Not Present- Anxiety, Bipolar, Change in Sleep Pattern, Depression, Fearful and Frequent crying. Endocrine Not Present- Cold Intolerance, Excessive Hunger, Hair Changes, Heat Intolerance, Hot flashes and New Diabetes. Hematology Not Present- Blood Thinners, Easy Bruising, Excessive bleeding, Gland problems, HIV and Persistent Infections.  There were no vitals filed for this visit.    Physical Exam  He is alert and oriented 3 in no acute distress Extra ocular motion intact, anicteric, moist mucous membranes Neck without mass or lymphadenopathy Unlabored respirations, clear bilaterally Regular rate and rhythm with palpable distal pulses Abdomen is soft nontender nondistended there is no umbilical hernia. There is a left greater than right inguinal hernia. He also has multiple lipomas throughout the abdominal wall. There is a transverse scar across the upper abdomen. Extremities are warm and well-perfused no deformity Neuro grossly intact, normal gait Psych normal mood and affect    Assessment & Plan   BILATERAL RECURRENT INGUINAL HERNIA WITHOUT OBSTRUCTION OR GANGRENE (Principal Diagnosis) (K40.21) Story: He has bilateral internal hernias, one of which is recurrent from a repair as an infant. We discussed laparoscopic repair of both sides. Discussed risk of bleeding, infection, pain, scarring, hernia recurrence, injury to intra-abdominal structures. Also discussed the risk that he may have some intra-abdominal adhesions given history of open nephrectomy followed by radiation. If laparoscopic repair were  not feasible we will proceed with open repair of the left side only as the right side is asymptomatic. He asked appropriate questions and is agreeable to proceed, hopefully at some point in late December.

## 2016-04-23 NOTE — Discharge Instructions (Signed)
LAPAROSCOPIC SURGERY: POST OP INSTRUCTIONS  ######################################################################  EAT Gradually transition to a high fiber diet with a fiber supplement over the next few weeks after discharge.  Start with a pureed / full liquid diet (see below)  WALK Walk an hour a day.  Control your pain to do that.    CONTROL PAIN Control pain so that you can walk, sleep, tolerate sneezing/coughing, go up/down stairs.  HAVE A BOWEL MOVEMENT DAILY Keep your bowels regular to avoid problems.  OK to try a laxative to override constipation.  OK to use an antidairrheal to slow down diarrhea.  Call if not better after 2 tries  CALL IF YOU HAVE PROBLEMS/CONCERNS Call if you are still struggling despite following these instructions. Call if you have concerns not answered by these instructions  ######################################################################    1. DIET: Follow a light bland diet the first 24 hours after arrival home, such as soup, liquids, crackers, etc.  Be sure to include lots of fluids daily.  Avoid fast food or heavy meals as your are more likely to get nauseated.  Eat a low fat the next few days after surgery.   2. Take your usually prescribed home medications unless otherwise directed. 3. PAIN CONTROL: a. Pain is best controlled by a usual combination of three different methods TOGETHER: i. Ice/Heat ii. Over the counter pain medication iii. Prescription pain medication b. Most patients will experience some swelling and bruising around the incisions and in the groin/scrotum.  Ice packs or heating pads (30-60 minutes up to 6 times a day) will help. Use ice for the first few days to help decrease swelling and bruising, then switch to heat to help relax tight/sore spots and speed recovery.  If it helps with the discomfort, ok to continue wearing compression shorts. Some people prefer to use ice alone, heat alone, alternating between ice & heat.   Experiment to what works for you.  Swelling and bruising can take several weeks to resolve.   c. It is helpful to take an over-the-counter pain medication regularly for the first few weeks.  Choose one of the following that works best for you: i. Naproxen (Aleve, etc)  Two 220mg  tabs twice a day ii. Ibuprofen (Advil, etc) Three 200mg  tabs four times a day (every meal & bedtime) iii. Acetaminophen (Tylenol, etc) 500-650mg  four times a day (every meal & bedtime)- be aware that the pain medication contains acetaminophen as well- do not combine these medications. d. A  prescription for pain medication (such as oxycodone, hydrocodone, etc) should be given to you upon discharge.  Take your pain medication as prescribed.  i. If you are having problems/concerns with the prescription medicine (does not control pain, nausea, vomiting, rash, itching, etc), please call us [phone] to see if we need to switch you to a different pain medicine that will work better for you and/or control your side effect better. ii. If you need a refill on your pain medication, please contact your pharmacy.  They will contact our office to request authorization. Prescriptions will not be filled after 5 pm or on week-ends. 4. Avoid getting constipated.  Between the surgery and the pain medications, it is common to experience some constipation.  Increasing fluid intake and taking a fiber supplement (such as Metamucil, Citrucel, FiberCon, MiraLax, etc) 1-2 times a day regularly will usually help prevent this problem from occurring.  A mild laxative (prune juice, Milk of Magnesia, MiraLax, etc) should be taken according to package directions if there  are no bowel movements after 48 hours.   5. Watch out for diarrhea.  If you have many loose bowel movements, simplify your diet to bland foods & liquids for a few days.  Stop any stool softeners and decrease your fiber supplement.  Switching to mild anti-diarrheal medications (Kayopectate,  Pepto Bismol) can help.  If this worsens or does not improve, please call us. 6. Wash / shower every day.  Let the soap and water run over the incisions and pat dry. Do not scrub or rub incisions, do not apply any lotions or ointments to the incisions.  7. You may leave the incisions open to air.  The skin glue will peel off after about 2 weeks.You may replace a dressing/Band-Aid to cover the incision for comfort if you wish.  8. ACTIVITIES as tolerated:   a. You may resume regular (light) daily activities beginning the next day--such as daily self-care, walking, climbing stairs--gradually increasing activities as tolerated.  If you can walk 30 minutes without difficulty, it is safe to try more intense activity such as jogging, treadmill, bicycling, low-impact aerobics, swimming, etc. b. Save the most intensive and strenuous activity for last such as sit-ups, heavy lifting, contact sports, etc  Refrain from any heavy lifting or straining until you are off narcotics for pain control.   c. DO NOT PUSH THROUGH PAIN.  Let pain be your guide: If it hurts to do something, don't do it.  Pain is your body warning you to avoid that activity for another week until the pain goes down. d. You may drive when you are no longer taking prescription pain medication, you can comfortably wear a seatbelt, and you can safely maneuver your car and apply brakes. e. [name] may have sexual intercourse when it is comfortable.  9. FOLLOW UP in our office a. Please call [facility] at [phone] to set up an appointment to see your surgeon in the office for a follow-up appointment approximately 2-3 weeks after your surgery. b. Make sure that you call for this appointment the day you arrive home to insure a convenient appointment time. 10. IF YOU HAVE DISABILITY OR FAMILY LEAVE FORMS, BRING THEM TO THE OFFICE FOR PROCESSING.  DO NOT GIVE THEM TO YOUR DOCTOR.   WHEN TO CALL us [phone]: 1. Poor pain control 2. Reactions /  problems with new medications (rash/itching, nausea, etc)  3. Fever over 101.5 F (38.5 C) 4. Inability to urinate 5. Nausea and/or vomiting 6. Worsening swelling or bruising 7. Continued bleeding from incision. 8. Increased pain, redness, or drainage from the incision   The clinic staff is available to answer your questions during regular business hours (8:30am-5pm).  Please dont hesitate to call and ask to speak to one of our nurses for clinical concerns.   If you have a medical emergency, go to the nearest emergency room or call 911.  A surgeon from [facility] is always on call at the [facility], [place], [place], [place], [place]  [id number] ? MAIN: [phone] ? TOLL FREE: [phone] ?  FAX [phone] [email or web address]

## 2016-04-23 NOTE — Anesthesia Postprocedure Evaluation (Addendum)
Anesthesia Post Note  Patient: MAARTEN BAISCH  Procedure(s) Performed: Procedure(s) (LRB): LAPAROSCOPIC  INGUINAL REPAIR (Left)  Patient location during evaluation: PACU Anesthesia Type: General Level of consciousness: awake and alert Pain management: pain level controlled Vital Signs Assessment: post-procedure vital signs reviewed and stable Respiratory status: spontaneous breathing, nonlabored ventilation, respiratory function stable and patient connected to nasal cannula oxygen Cardiovascular status: blood pressure returned to baseline and stable Postop Assessment: no signs of nausea or vomiting Anesthetic complications: no       Last Vitals:  Vitals:   04/23/16 1443 04/23/16 1450  BP:  (!) 146/81  Pulse:    Resp:    Temp: 36.7 C     Last Pain:  Vitals:   04/23/16 0819  TempSrc: Oral                 Baptiste Littler

## 2016-04-23 NOTE — Anesthesia Preprocedure Evaluation (Signed)
Anesthesia Evaluation  Patient identified by MRN, date of birth, ID band Patient awake    Reviewed: Allergy & Precautions, NPO status , Patient's Chart, lab work & pertinent test results, reviewed documented beta blocker date and time   History of Anesthesia Complications (+) PONV and history of anesthetic complications  Airway Mallampati: III  TM Distance: >3 FB Neck ROM: Full    Dental  (+) Teeth Intact   Pulmonary neg shortness of breath, asthma , neg recent URI,    breath sounds clear to auscultation       Cardiovascular hypertension, Pt. on medications and Pt. on home beta blockers (-) angina(-) CHF  Rhythm:Regular     Neuro/Psych PSYCHIATRIC DISORDERS negative neurological ROS     GI/Hepatic Neg liver ROS, hiatal hernia,   Endo/Other  diabetes, Type 2  Renal/GU Renal diseasePrevious wilm's tumor, single kidney post resection     Musculoskeletal  (+) Arthritis ,   Abdominal   Peds  Hematology negative hematology ROS (+)   Anesthesia Other Findings   Reproductive/Obstetrics                             Anesthesia Physical Anesthesia Plan  ASA: II  Anesthesia Plan: General   Post-op Pain Management:    Induction: Intravenous  Airway Management Planned: Oral ETT  Additional Equipment: None  Intra-op Plan:   Post-operative Plan: Extubation in OR  Informed Consent: I have reviewed the patients History and Physical, chart, labs and discussed the procedure including the risks, benefits and alternatives for the proposed anesthesia with the patient or authorized representative who has indicated his/her understanding and acceptance.   Dental advisory given  Plan Discussed with: CRNA and Surgeon  Anesthesia Plan Comments:         Anesthesia Quick Evaluation

## 2016-04-23 NOTE — Op Note (Signed)
Operative Note  DEKKER SAMBORSKI  [id number]  [id number]  04/23/2016   Surgeon: Clovis Riley  Assistant: Fanny Skates  Procedure performed: TAPP laparoscopic left inguinal hernia with UltrPro Mesh  Preop diagnosis: symptomatic left inguinal hernia Post-op diagnosis/intraop findings: Large indirect and moderate direct left inguinal hernia with small cord lipoma. No defect on the right side.   Specimens: none Retained items: none EBL: [id number] Complications: none  Description of procedure: After obtaining informed consent the patient was taken to the operating room and placed supine on operating room table wheregeneral endotracheal anesthesia was initiated, preoperative antibiotics were administered, SCDs applied, and a formal timeout was performed. The abdomen was clipped, prepped, and draped in the usual sterile fashion.  The peritoneal cavity was entered using the Hasson technique just below the umbilicus and insufflated to 15 mmHg. Gross inspection revealed no evidence of injury from our entry, few filmy omental adhesions to the superior anterior abdominal, and a left indirect and direct inguinal hernia. The right side was also inspected, there was some laxity at the direct space but no defect, and the indirect space had a small dimple with evidence of scar from prior repair but there was no hernia on this side requiring intervention especially as he was asymomatic.  Thus he was placed in steep Trendelenburg and bilateral 5 mm trocars were introduced under direct visualization. A peritoneal flap was developed on the left side using combination cautery, sharp and blunt dissection. This extended from just medial to the ASIS to the medial umbilical ligament, well above the iliopubic tract. The direct sac easily reduced. The indirect sac was densely adherent to the cord structures and was difficult to reduce, but ultimately was able to be reduced intact. The small lipoma posterior to the  cord structures was excised. A small amount of oozing at the base of the lipoma was controlled with clips. Once the flap had been adequately mobilized for placement of the mesh, a 3x6 piece of ultrapro mesh was brought to the field and trimmed slightly to fit the site. This was introduced and situated to lie flat with at least 3cm of overlap circumferentially around each of the hernia defects. It was tacked using the SecureStrap tacker to the cooper's ligament and superiorly on either side of the inferior epigastric vessels. The peritoneal flap was then brought back up to cover the mesh and secured with more vicryl tacks. The abdomen was once again inspected. Hemostasis was confirmed. The hernia repair was entirely covered with native peritoneum. The hassan trocar was removed and the fascial pursestring tied down with the abdomen insufflated. This closure was noted to be airtight. The abdomen was then desufflated, the remaining 2 13mm trocars removed and the skin incisions were closed with subcuticular monocryl and dermabond. The patient was then awakened, extubated and taken to PACU in stable condition.   All counts were correct at the completion of the case.

## 2016-04-24 ENCOUNTER — Encounter (HOSPITAL_COMMUNITY): Payer: Self-pay | Admitting: Surgery

## 2016-04-24 ENCOUNTER — Ambulatory Visit: Payer: BLUE CROSS/BLUE SHIELD | Admitting: Interventional Cardiology

## 2016-05-01 NOTE — Progress Notes (Signed)
Patient ID: [name], male   DOB: 1965-10-15, 51 y.o.   MRN: [id number]     Cardiology Office Note   Date:  05/02/2016   ID:  [name] 05-05-1965, MRN [id number]  PCP:  David Melter, MD    No chief complaint on file. f/u mitral regurgitation   Wt Readings from Last 3 Encounters:  05/02/16 203 lb (92.1 kg)  04/23/16 210 lb 3.2 oz (95.3 kg)  04/21/16 210 lb 3.2 oz (95.3 kg)       History of Present Illness: [name] is a 51 y.o. male  who has a family h/o CAD at a young age. He has had HTN for over 10 years. BP is usually controlled on meds. He had lipoma removal and then a skin infection in 2015. He was given an ABx and then he continued to feel poorly. 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 He completed a 6 week course of antibiotics to treat enterococcus in his blood. He was evaluated by cardiac surgery as well. He had moderate mitral regurgitation noted on his echocardiogram in January 2016. He is being managed medically.  He had hernia surgery a few weeks ago which went well.    Prior to the hernia surgery: He was working out regularly. He does cardio 45 minutes total b/w bike and elliptical and treadmill. He tries to do something every other day. He is doing weights. He has had knee issues. No CP, SHOB. He takes asthma medicine, related to allergies. Pulse during workouts go to 135-150. No problems with exercise. Increasing stamina.   He will resume exercise soon.    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  He is looking at getting another car.    Family history includes father with CABG, bilateral CEA. CABG at about 55. Mother as low BP and thyroid cancer. Sister has not had problems. Paternal GM with stroke.   Most recent echo in October 2016 and showed moderate MR with EF 45-50%. He continues to feel well. No  further appts with CT surgery planned.  Wears compression stockings when he travels.  THis helps with swelling.      Past Medical History:  Diagnosis Date  . Allergic rhinitis   . Asthma    animal dander & seasonal asthma  . Diabetes mellitus without complication ([place])    dx 2010  . Discitis of lumbosacral region    first started with this ..and rolled onto the endocarditis    stayed a month  . 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 H/O scoliosis   . History of hiatal hernia   . Hyperlipidemia   . Hypertension    Diagnostic exercise tolerance test assessment:04/30/2010 : comments normal -no evidence os ischemia by ST analysis  . lipoma   . PONV (postoperative nausea and vomiting)   . Solitary kidney   . 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 INGUINAL HERNIA REPAIR Left 04/23/2016   Procedure: LAPAROSCOPIC  INGUINAL REPAIR;  Surgeon: Clovis Riley, MD;  Location: [facility];  Service: General;  Laterality: Left;  . KNEE SURGERY    . LIPOMA EXCISION    . THYROID SURGERY    . TONSILLECTOMY    . TOTAL NEPHRECTOMY Left  Current Outpatient Prescriptions  Medication Sig Dispense Refill  . acetaminophen (TYLENOL) 500 MG tablet Take 500 mg by mouth daily as needed for moderate pain or headache.    . albuterol (PROVENTIL HFA;VENTOLIN HFA) 108 (90 BASE) MCG/ACT inhaler Inhale 1 puff into the lungs every 6 (six) hours as needed for wheezing or shortness of breath.    Marland Kitchen amoxicillin (AMOXIL) 500 MG tablet Take all 4 tablets (2000 mg) 1 hour prior to surgery 4 tablet 0  . aspirin EC 81 MG tablet Take 81 mg by mouth every evening.     Marland Kitchen atorvastatin (LIPITOR) 10 MG tablet Take 1 tablet (10 mg total) by mouth daily. (Patient taking differently: Take 10 mg by mouth every evening. ) 30 tablet 1  . calcium carbonate (TUMS - DOSED IN MG ELEMENTAL CALCIUM) 500 MG chewable tablet  Chew 1 tablet by mouth daily as needed for indigestion or heartburn.    . diphenhydrAMINE (BENADRYL) 25 MG tablet Take 25 mg by mouth daily as needed for allergies.    Marland Kitchen docusate sodium (COLACE) 100 MG capsule Take 1 capsule (100 mg total) by mouth 2 (two) times daily. 60 capsule 0  . lisinopril (PRINIVIL,ZESTRIL) 2.5 MG tablet Take 1 tablet (2.5 mg total) by mouth daily. 30 tablet 1  . metFORMIN (GLUCOPHAGE) 500 MG tablet Take 500 mg by mouth 2 (two) times daily.    . mometasone (ASMANEX) 220 MCG/INH inhaler Inhale 1 puff into the lungs daily.    . Multiple Vitamin (MULTIVITAMIN WITH MINERALS) TABS tablet Take 1 tablet by mouth 2 (two) times daily.    . Omega-3 Fatty Acids (FISH OIL) 1000 MG CAPS Take 1,000 mg by mouth 2 (two) times daily.    . ONE TOUCH ULTRA TEST test strip 1 each by Other route as needed (GLUCOSE).     Marland Kitchen oxyCODONE-acetaminophen (PERCOCET/ROXICET) 5-325 MG tablet Take 1 tablet by mouth every 6 (six) hours as needed for severe pain. 30 tablet 0  . repaglinide (PRANDIN) 0.5 MG tablet Take 0.5 mg by mouth 2 (two) times daily before a meal.    . TOPROL XL 25 MG 24 hr tablet Take 25 mg by mouth daily.      No current facility-administered medications for this visit.     Allergies:   No known allergies    Social History:  The patient  reports that he has never smoked. He has never used smokeless tobacco. He reports that he does not drink alcohol or use drugs.   Family History:  The patient's family history includes Cancer in his mother; Healthy in his sister; Heart attack in his maternal grandfather and paternal grandfather; Heart disease in his father; Hernia in his father; Hypotension in his mother.    ROS:  Please see the history of present illness.   Otherwise, review of systems are positive for Swelling as noted above.   All other systems are reviewed and negative.    PHYSICAL EXAM: VS:  BP 128/86 (BP Location: Left Arm)   Pulse 66   Ht 6' (1.829 m)   Wt 203 lb (92.1  kg)   BMI 27.53 kg/m  , BMI Body mass index is 27.53 kg/m. GEN: Well nourished, well developed, in no acute distress  HEENT: normal  Neck: no JVD, carotid bruits, or masses Cardiac: RRR; soft, early systolic murmur, no rubs, or gallops,no edema  Respiratory:  clear to auscultation bilaterally, normal work of breathing GI: soft, nontender, nondistended, + BS; healing hernia incisions MS: no  deformity or atrophy  Skin: warm and dry, no rash Neuro:  Strength and sensation are intact Psych: euthymic mood, full affect   EKG:   The ekg ordered today demonstrates NSR, no ST segment changes   Recent Labs: 04/21/2016: ALT 29; BUN 24; Creatinine, Ser 1.19; Hemoglobin 15.5; Platelets 133; Potassium 4.5; Sodium 138   Lipid Panel    Component Value Date/Time   CHOL 116 03/05/2014 0630   TRIG 122 03/05/2014 0630   HDL 21 (L) 03/05/2014 0630   CHOLHDL 5.5 03/05/2014 0630   VLDL 24 03/05/2014 0630   LDLCALC 71 03/05/2014 0630     Other studies Reviewed: Additional studies/ records that were reviewed today with results demonstrating: Echo reviewed as noted above.   ASSESSMENT AND PLAN:  1.  Mitral valve disease: Moderate mitral regurgitation with stable left ventricular function. Will repeat echocardiogram in August 2018, unless sx arise. We'll see the patient back in about 6 months. 2. Family history of CAD: Several members of his family with heart disease. His hypertension and hyperlipidemia are well controlled. LDL from December 2015 was 71.  Followed with PMD. 3. Diabetes: Managed by his primary care physician. A1c was well controlled.  Spoke at length about low carb options for snacks and meals.  4. Stressed the importance of SBE prophylaxis with dental work given his prior endocarditis.    Current medicines are reviewed at length with the patient today.  The patient concerns regarding his medicines were addressed.  The following changes have been made:  No change  Labs/ tests  ordered today include:  No orders of the defined types were placed in this encounter.   Recommend 150 minutes/week of aerobic exercise Low fat, low carb, high fiber diet recommended  Disposition:   FU in 6 months   Signed, Larae Grooms, MD  05/02/2016 12:27 PM    [facility] [place], [place],   [address] Phone: [phone]; Fax: [phone]

## 2016-05-02 ENCOUNTER — Encounter: Payer: Self-pay | Admitting: Interventional Cardiology

## 2016-05-02 ENCOUNTER — Ambulatory Visit (INDEPENDENT_AMBULATORY_CARE_PROVIDER_SITE_OTHER): Payer: BLUE CROSS/BLUE SHIELD | Admitting: Interventional Cardiology

## 2016-05-02 VITALS — BP 128/86 | HR 66 | Ht 72.0 in | Wt 203.0 lb

## 2016-05-02 DIAGNOSIS — E119 Type 2 diabetes mellitus without complications: Secondary | ICD-10-CM

## 2016-05-02 DIAGNOSIS — I340 Nonrheumatic mitral (valve) insufficiency: Secondary | ICD-10-CM

## 2016-05-02 DIAGNOSIS — Z8249 Family history of ischemic heart disease and other diseases of the circulatory system: Secondary | ICD-10-CM | POA: Diagnosis not present

## 2016-05-02 NOTE — Patient Instructions (Signed)
Medication Instructions:  Same-no changes  Labwork: None  Testing/Procedures: Your physician has requested that you have an echocardiogram to be done in 6 months just prior to your 6 month follow up with Dr Irish Lack. Echocardiography is a painless test that uses sound waves to create images of your heart. It provides your doctor with information about the size and shape of your heart and how well your heart's chambers and valves are working. This procedure takes approximately one hour. There are no restrictions for this procedure.    Follow-Up: Your physician wants you to follow-up in: 6 months. You will receive a reminder letter in the mail two months in advance. If you don't receive a letter, please call our office to schedule the follow-up appointment.     If you need a refill on your cardiac medications before your next appointment, please call your pharmacy.

## 2016-07-30 DIAGNOSIS — M9903 Segmental and somatic dysfunction of lumbar region: Secondary | ICD-10-CM | POA: Diagnosis not present

## 2016-07-30 DIAGNOSIS — M9904 Segmental and somatic dysfunction of sacral region: Secondary | ICD-10-CM | POA: Diagnosis not present

## 2016-07-30 DIAGNOSIS — M9901 Segmental and somatic dysfunction of cervical region: Secondary | ICD-10-CM | POA: Diagnosis not present

## 2016-08-01 DIAGNOSIS — I10 Essential (primary) hypertension: Secondary | ICD-10-CM | POA: Diagnosis not present

## 2016-08-01 DIAGNOSIS — E119 Type 2 diabetes mellitus without complications: Secondary | ICD-10-CM | POA: Diagnosis not present

## 2016-08-01 DIAGNOSIS — E7800 Pure hypercholesterolemia, unspecified: Secondary | ICD-10-CM | POA: Diagnosis not present

## 2016-08-01 DIAGNOSIS — J45909 Unspecified asthma, uncomplicated: Secondary | ICD-10-CM | POA: Diagnosis not present

## 2016-09-01 NOTE — Addendum Note (Signed)
Addendum  created 09/01/16 1027 by Oleta Mouse, MD   Sign clinical note

## 2016-09-08 DIAGNOSIS — E119 Type 2 diabetes mellitus without complications: Secondary | ICD-10-CM | POA: Diagnosis not present

## 2016-10-31 ENCOUNTER — Ambulatory Visit (HOSPITAL_COMMUNITY): Payer: BLUE CROSS/BLUE SHIELD | Attending: Cardiology

## 2016-10-31 ENCOUNTER — Other Ambulatory Visit: Payer: Self-pay

## 2016-10-31 DIAGNOSIS — I340 Nonrheumatic mitral (valve) insufficiency: Secondary | ICD-10-CM | POA: Insufficient documentation

## 2016-11-06 ENCOUNTER — Telehealth: Payer: Self-pay | Admitting: Interventional Cardiology

## 2016-11-06 NOTE — Telephone Encounter (Signed)
New message ° ° ° °Pt is returning call about echo results.  °

## 2016-11-06 NOTE — Telephone Encounter (Signed)
Patient made aware of results. Patient verbalizes understanding.  

## 2016-11-06 NOTE — Telephone Encounter (Signed)
-----   Message from Jettie Booze, MD sent at 11/02/2016  3:56 PM EDT ----- Normal LV function with mild to moderate MR.

## 2016-12-17 NOTE — Progress Notes (Signed)
Cardiology Office Note   Date:  12/18/2016   ID:  David, [name] 11/07/1965, MRN [id number]  PCP:  David Melter, MD    No chief complaint on file.  F/u mitral regurgitation  Wt Readings from Last 3 Encounters:  12/18/16 212 lb (96.2 kg)  05/02/16 203 lb (92.1 kg)  04/23/16 210 lb 3.2 oz (95.3 kg)       History of Present Illness: [name] is a 51 y.o. male  who has a family h/o CAD at a young age. He has had HTN for over 10 years. BP is usually controlled on meds. He had lipoma removal and then a skin infection in 2015. He was given an ABx and then he continued to feel poorly. 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 He completed a 6 week course of antibiotics to treat enterococcus in his blood. He was evaluated by cardiac surgery as well. He had moderate mitral regurgitation noted on his echocardiogram in January 2016. He is being managed medically.  Family history includes father with CABG, bilateral CEA. CABG at about 39. Mother as low BP and thyroid cancer. Sister has not had problems. Paternal GM with stroke.   Most recent echo in October 2016 and showed moderate MR with EF 45-50%. He continues to feel well. No further appts with CT surgery planned.  Wears compression stockings when he travels.  THis helps with swelling.   Still exercising 3-4 days/week.  Doing cardio and weights.  He does some yoga as well.  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    Denies : Chest pain. Dizziness. Leg edema. Nitroglycerin use. Orthopnea. Palpitations. Paroxysmal nocturnal dyspnea. Shortness of breath. Syncope.      Past Medical History:  Diagnosis Date  . Allergic rhinitis   . Asthma    animal dander & seasonal asthma  . Diabetes mellitus without complication ([place])    dx 2010  . Discitis of lumbosacral region    first started with this ..and rolled onto the endocarditis    stayed a month  .  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 H/O scoliosis   . History of hiatal hernia   . Hyperlipidemia   . Hypertension    Diagnostic exercise tolerance test assessment:04/30/2010 : comments normal -no evidence os ischemia by ST analysis  . lipoma   . PONV (postoperative nausea and vomiting)   . Solitary kidney   . 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 INGUINAL HERNIA REPAIR Left 04/23/2016   Procedure: LAPAROSCOPIC  INGUINAL REPAIR;  Surgeon: Clovis Riley, MD;  Location: [facility];  Service: General;  Laterality: Left;  . KNEE SURGERY    . LIPOMA EXCISION    . THYROID SURGERY    . TONSILLECTOMY    . TOTAL NEPHRECTOMY Left      Current Outpatient Prescriptions  Medication Sig Dispense Refill  . acetaminophen (TYLENOL) 500 MG tablet Take 500 mg by mouth daily as needed for moderate pain or headache.    . albuterol (PROVENTIL HFA;VENTOLIN HFA) 108 (90 BASE) MCG/ACT inhaler Inhale 1 puff into the lungs every 6 (six) hours as needed for wheezing or shortness of breath.    Marland Kitchen amoxicillin (AMOXIL) 500 MG tablet Take all 4 tablets (2000 mg) 1 hour prior to surgery 4  tablet 0  . aspirin EC 81 MG tablet Take 81 mg by mouth every evening.     Marland Kitchen atorvastatin (LIPITOR) 10 MG tablet Take 1 tablet (10 mg total) by mouth daily. (Patient taking differently: Take 10 mg by mouth every evening. ) 30 tablet 1  . calcium carbonate (TUMS - DOSED IN MG ELEMENTAL CALCIUM) 500 MG chewable tablet Chew 1 tablet by mouth daily as needed for indigestion or heartburn.    . diphenhydrAMINE (BENADRYL) 25 MG tablet Take 25 mg by mouth daily as needed for allergies.    Marland Kitchen lisinopril (PRINIVIL,ZESTRIL) 2.5 MG tablet Take 1 tablet (2.5 mg total) by mouth daily. 30 tablet 1  . metFORMIN (GLUCOPHAGE) 500 MG tablet Take 500 mg by mouth 2 (two) times daily.    . mometasone (ASMANEX) 220 MCG/INH inhaler Inhale 1  puff into the lungs daily.    . Multiple Vitamin (MULTIVITAMIN WITH MINERALS) TABS tablet Take 1 tablet by mouth 2 (two) times daily.    . Omega-3 Fatty Acids (FISH OIL) 1000 MG CAPS Take 1,000 mg by mouth 2 (two) times daily.    . ONE TOUCH ULTRA TEST test strip 1 each by Other route as needed (GLUCOSE).     Marland Kitchen oxyCODONE-acetaminophen (PERCOCET/ROXICET) 5-325 MG tablet Take 1 tablet by mouth every 6 (six) hours as needed for severe pain. 30 tablet 0  . repaglinide (PRANDIN) 0.5 MG tablet Take 0.5 mg by mouth 2 (two) times daily before a meal.    . TOPROL XL 25 MG 24 hr tablet Take 25 mg by mouth daily.      No current facility-administered medications for this visit.     Allergies:   No known allergies    Social History:  The patient  reports that he has never smoked. He has never used smokeless tobacco. He reports that he does not drink alcohol or use drugs.   Family History:  The patient's family history includes Cancer in his mother; Healthy in his sister; Heart attack in his maternal grandfather and paternal grandfather; Heart disease in his father; Hernia in his father; Hypotension in his mother.    ROS:  Please see the history of present illness.   Otherwise, review of systems are positive for rare leg swelling.   All other systems are reviewed and negative.    PHYSICAL EXAM: VS:  BP 124/60   Pulse 67   Ht 6' (1.829 m)   Wt 212 lb (96.2 kg)   SpO2 97%   BMI 28.75 kg/m  , BMI Body mass index is 28.75 kg/m. GEN: Well nourished, well developed, in no acute distress  HEENT: normal  Neck: no JVD, carotid bruits, or masses Cardiac: RRR; 1/6 systolic murmur, no rubs, or gallops,no edema  Respiratory:  clear to auscultation bilaterally, normal work of breathing GI: soft, nontender, nondistended, + BS MS: no deformity or atrophy  Skin: warm and dry, no rash Neuro:  Strength and sensation are intact Psych: euthymic mood, full affect    Recent Labs: 04/21/2016: ALT 29; BUN 24;  Creatinine, Ser 1.19; Hemoglobin 15.5; Platelets 133; Potassium 4.5; Sodium 138   Lipid Panel    Component Value Date/Time   CHOL 116 03/05/2014 0630   TRIG 122 03/05/2014 0630   HDL 21 (L) 03/05/2014 0630   CHOLHDL 5.5 03/05/2014 0630   VLDL 24 03/05/2014 0630   LDLCALC 71 03/05/2014 0630     Other studies Reviewed: Additional studies/ records that were reviewed today with results demonstrating: 8/18  echo.   ASSESSMENT AND PLAN:  1. Mitral regurgitation: No sx of CHF.  Reviewed echo from 8/18. Only mild to moderate eccentric mitral regurgitation. Continue SBE prophylaxis.   2. Family history of coronary artery disease:  No ischemic sx.  We spoke about CoQ10 and Niacin.  I don't think he needs either at this time. He had severe flushing with Niacin in the past.  3. Diabetes: A1C was close to 7 at last check, increased. He thinks he is gaining some muscle.  Careful diet to reduce sugar. 4. History of endocarditis: He will need SBE prophylaxis for dental procedures.   Current medicines are reviewed at length with the patient today.  The patient concerns regarding his medicines were addressed.  The following changes have been made:  No change  Labs/ tests ordered today include:  No orders of the defined types were placed in this encounter.   Recommend 150 minutes/week of aerobic exercise Low fat, low carb, high fiber diet recommended  Disposition:   FU in 1 year   Signed, Larae Grooms, MD  12/18/2016 8:19 AM    [facility] [place], [place]  [address] Phone: [phone]; Fax: [phone]

## 2016-12-18 ENCOUNTER — Encounter: Payer: Self-pay | Admitting: Interventional Cardiology

## 2016-12-18 ENCOUNTER — Ambulatory Visit (INDEPENDENT_AMBULATORY_CARE_PROVIDER_SITE_OTHER): Payer: BLUE CROSS/BLUE SHIELD | Admitting: Interventional Cardiology

## 2016-12-18 VITALS — BP 124/60 | HR 67 | Ht 72.0 in | Wt 212.0 lb

## 2016-12-18 DIAGNOSIS — Z8249 Family history of ischemic heart disease and other diseases of the circulatory system: Secondary | ICD-10-CM

## 2016-12-18 DIAGNOSIS — I340 Nonrheumatic mitral (valve) insufficiency: Secondary | ICD-10-CM | POA: Diagnosis not present

## 2016-12-18 DIAGNOSIS — Z8679 Personal history of other diseases of the circulatory system: Secondary | ICD-10-CM

## 2016-12-18 DIAGNOSIS — E119 Type 2 diabetes mellitus without complications: Secondary | ICD-10-CM | POA: Diagnosis not present

## 2016-12-18 NOTE — Patient Instructions (Signed)

## 2017-02-05 DIAGNOSIS — J45909 Unspecified asthma, uncomplicated: Secondary | ICD-10-CM | POA: Diagnosis not present

## 2017-02-05 DIAGNOSIS — E7800 Pure hypercholesterolemia, unspecified: Secondary | ICD-10-CM | POA: Diagnosis not present

## 2017-02-05 DIAGNOSIS — I10 Essential (primary) hypertension: Secondary | ICD-10-CM | POA: Diagnosis not present

## 2017-02-05 DIAGNOSIS — Z23 Encounter for immunization: Secondary | ICD-10-CM | POA: Diagnosis not present

## 2017-02-05 DIAGNOSIS — E119 Type 2 diabetes mellitus without complications: Secondary | ICD-10-CM | POA: Diagnosis not present

## 2017-09-24 DIAGNOSIS — R0681 Apnea, not elsewhere classified: Secondary | ICD-10-CM | POA: Diagnosis not present

## 2017-09-24 DIAGNOSIS — G4710 Hypersomnia, unspecified: Secondary | ICD-10-CM | POA: Diagnosis not present

## 2017-10-08 DIAGNOSIS — H538 Other visual disturbances: Secondary | ICD-10-CM | POA: Diagnosis not present

## 2017-10-08 DIAGNOSIS — E119 Type 2 diabetes mellitus without complications: Secondary | ICD-10-CM | POA: Diagnosis not present

## 2017-10-09 DIAGNOSIS — E119 Type 2 diabetes mellitus without complications: Secondary | ICD-10-CM | POA: Diagnosis not present

## 2017-11-05 DIAGNOSIS — I10 Essential (primary) hypertension: Secondary | ICD-10-CM | POA: Diagnosis not present

## 2017-11-05 DIAGNOSIS — E119 Type 2 diabetes mellitus without complications: Secondary | ICD-10-CM | POA: Diagnosis not present

## 2017-11-05 DIAGNOSIS — E7800 Pure hypercholesterolemia, unspecified: Secondary | ICD-10-CM | POA: Diagnosis not present

## 2017-11-05 DIAGNOSIS — J45909 Unspecified asthma, uncomplicated: Secondary | ICD-10-CM | POA: Diagnosis not present

## 2018-01-12 ENCOUNTER — Encounter: Payer: Self-pay | Admitting: Interventional Cardiology

## 2018-01-12 ENCOUNTER — Ambulatory Visit: Payer: BLUE CROSS/BLUE SHIELD | Admitting: Interventional Cardiology

## 2018-01-12 VITALS — BP 132/90 | HR 58 | Ht 72.0 in | Wt 210.4 lb

## 2018-01-12 DIAGNOSIS — Z8679 Personal history of other diseases of the circulatory system: Secondary | ICD-10-CM | POA: Diagnosis not present

## 2018-01-12 DIAGNOSIS — I340 Nonrheumatic mitral (valve) insufficiency: Secondary | ICD-10-CM

## 2018-01-12 DIAGNOSIS — Z8249 Family history of ischemic heart disease and other diseases of the circulatory system: Secondary | ICD-10-CM

## 2018-01-12 DIAGNOSIS — E119 Type 2 diabetes mellitus without complications: Secondary | ICD-10-CM | POA: Diagnosis not present

## 2018-01-12 NOTE — Progress Notes (Signed)
Cardiology Office Note   Date:  01/12/2018   ID:  [name], David May 08, 1965, MRN [id number]  PCP:  Orpah Melter, MD    No chief complaint on file.  Mitral regurgitation  Wt Readings from Last 3 Encounters:  01/12/18 210 lb 6.4 oz (95.4 kg)  12/18/16 212 lb (96.2 kg)  05/02/16 203 lb (92.1 kg)       History of Present Illness: [name] is a 52 y.o. male  who has a family h/o CAD at a young age. He has had HTN for over 10 years. BP is usually controlled on meds. He had lipoma removal and then a skin infection in 2015. He was given an ABx and then he continued to feel poorly. 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 He completed a 6 week course of antibiotics to treat enterococcus in his blood. He was evaluated by cardiac surgery as well. He had moderate mitral regurgitation noted on his echocardiogram in January 2016. He is being managed medically.  Family history includes father with CABG, bilateral CEA. CABG at about 46.He also had an ablation. Mother as low BP and thyroid cancer. Sister has not had problems. Paternal GM with stroke.   Most recent echo in August 2018 and showed mild to moderate MR with EF 50%.  No further appts with CT surgery planned.  Wears compression stockings when he travels. THis helps with swelling.   He continues to race competetively.    Denies : Chest pain. Dizziness. Leg edema. Nitroglycerin use. Orthopnea. Palpitations. Paroxysmal nocturnal dyspnea. Shortness of breath. Syncope.   Still exercises regularly.  No cardiac sx.  He has been eating more.    He may need a knee replacement.     Past Medical History:  Diagnosis Date  . Allergic rhinitis   . Asthma    animal dander & seasonal asthma  . Diabetes mellitus without complication ([place])    dx 2010  . Discitis of lumbosacral region    first started with this ..and rolled onto the endocarditis     stayed a month  . 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 H/O scoliosis   . History of hiatal hernia   . Hyperlipidemia   . Hypertension    Diagnostic exercise tolerance test assessment:04/30/2010 : comments normal -no evidence os ischemia by ST analysis  . lipoma   . PONV (postoperative nausea and vomiting)   . Solitary kidney   . 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 INGUINAL HERNIA REPAIR Left 04/23/2016   Procedure: LAPAROSCOPIC  INGUINAL REPAIR;  Surgeon: Clovis Riley, MD;  Location: [facility];  Service: General;  Laterality: Left;  . KNEE SURGERY    . LIPOMA EXCISION    . THYROID SURGERY    . TONSILLECTOMY    . TOTAL NEPHRECTOMY Left      Current Outpatient Medications  Medication Sig Dispense Refill  . acetaminophen (TYLENOL) 500 MG tablet Take 500 mg by mouth daily as needed for moderate pain or headache.    . albuterol (PROVENTIL HFA;VENTOLIN HFA) 108 (90 BASE) MCG/ACT inhaler Inhale 1 puff into the lungs every 6 (six) hours as needed for wheezing or shortness of breath.    Marland Kitchen amoxicillin (AMOXIL) 500 MG tablet Take all 4 tablets (2000 mg) 1 hour prior  to surgery 4 tablet 0  . aspirin EC 81 MG tablet Take 81 mg by mouth every evening.     Marland Kitchen atorvastatin (LIPITOR) 10 MG tablet Take 1 tablet (10 mg total) by mouth daily. (Patient taking differently: Take 10 mg by mouth every evening. ) 30 tablet 1  . calcium carbonate (TUMS - DOSED IN MG ELEMENTAL CALCIUM) 500 MG chewable tablet Chew 1 tablet by mouth daily as needed for indigestion or heartburn.    Marland Kitchen lisinopril (PRINIVIL,ZESTRIL) 2.5 MG tablet Take 1 tablet (2.5 mg total) by mouth daily. 30 tablet 1  . metFORMIN (GLUCOPHAGE) 500 MG tablet Take 500 mg by mouth 2 (two) times daily.    . mometasone (ASMANEX) 220 MCG/INH inhaler Inhale 1 puff into the lungs daily.    . Multiple Vitamin (MULTIVITAMIN WITH MINERALS)  TABS tablet Take 1 tablet by mouth 2 (two) times daily.    . Omega-3 Fatty Acids (FISH OIL) 1000 MG CAPS Take 1,000 mg by mouth 2 (two) times daily.    . ONE TOUCH ULTRA TEST test strip 1 each by Other route as needed (GLUCOSE).     Marland Kitchen repaglinide (PRANDIN) 0.5 MG tablet Take 0.5 mg by mouth 2 (two) times daily before a meal.    . TOPROL XL 25 MG 24 hr tablet Take 25 mg by mouth daily.      No current facility-administered medications for this visit.     Allergies:   No known allergies    Social History:  The patient  reports that he has never smoked. He has never used smokeless tobacco. He reports that he does not drink alcohol or use drugs.   Family History:  The patient's family history includes Cancer in his mother; Healthy in his sister; Heart attack in his maternal grandfather and paternal grandfather; Heart disease in his father; Hernia in his father; Hypotension in his mother.    ROS:  Please see the history of present illness.   Otherwise, review of systems are positive for knee pain.   All other systems are reviewed and negative.    PHYSICAL EXAM: VS:  BP 132/90   Pulse (!) 58   Ht 6' (1.829 m)   Wt 210 lb 6.4 oz (95.4 kg)   SpO2 97%   BMI 28.54 kg/m  , BMI Body mass index is 28.54 kg/m. GEN: Well nourished, well developed, in no acute distress  HEENT: normal  Neck: no JVD, carotid bruits, or masses Cardiac: RRR; 1/6 systolic murmur, norubs, or gallops,no edema  Respiratory:  clear to auscultation bilaterally, normal work of breathing GI: soft, nontender, nondistended, + BS MS: no deformity or atrophy  Skin: warm and dry, no rash Neuro:  Strength and sensation are intact Psych: euthymic mood, full affect   EKG:   The ekg ordered today demonstrates NSR, no ST changes   Recent Labs: No results found for requested labs within last 8760 hours.   Lipid Panel    Component Value Date/Time   CHOL 116 03/05/2014 0630   TRIG 122 03/05/2014 0630   HDL 21 (L)  03/05/2014 0630   CHOLHDL 5.5 03/05/2014 0630   VLDL 24 03/05/2014 0630   LDLCALC 71 03/05/2014 0630     Other studies Reviewed: Additional studies/ records that were reviewed today with results demonstrating: prior echo from 2018.   ASSESSMENT AND PLAN:  1. Mitral regurgitation: Prior endocarditis. No sign of CHF.  No need for echo at this time.  Let us know if there  is a change in exercise tolerance. COnsdier echo next year, or if sx change.  Murmur not prominent on exam. 2. Family h/o CAD: LDL 115.  Continue atorvastatin.  3. Diabetes:  A1C 7.3.  Healthy diet.  Medication.  Increase exercise as knee tolerates. 4. H/o endocarditis: Needs SBE prophylaxis for dental procedures going forward.   Current medicines are reviewed at length with the patient today.  The patient concerns regarding his medicines were addressed.  The following changes have been made:  No change  Labs/ tests ordered today include:   Orders Placed This Encounter  Procedures  . EKG 12-Lead    Recommend 150 minutes/week of aerobic exercise Low fat, low carb, high fiber diet recommended  Disposition:   FU in 1 year   Signed, Larae Grooms, MD  01/12/2018 [facility] [place], [place], [place]  [address] Phone: [phone]; Fax: [phone]

## 2018-01-12 NOTE — Patient Instructions (Signed)

## 2018-03-08 ENCOUNTER — Telehealth: Payer: Self-pay | Admitting: *Deleted

## 2018-03-08 NOTE — Telephone Encounter (Signed)
   [facility] Pre-operative Risk Assessment    Request for surgical clearance:  1. What type of surgery is being performed? COLONOSCOPY   2. When is this surgery scheduled? 03/23/18   3. Are there any medications that need to be held prior to surgery and how long? ASA   4. Practice name and name of physician performing surgery? [facility]; DR. Evette Doffing SCHO[facility]ER   5. What is your office phone and fax number? PH# [phone]; FAX# [phone]   6. Anesthesia type (None, local, MAC, general) ? PROPOFOL   Julaine Hua 03/08/2018, 10:17 AM  _________________________________________________________________   (provider comments below)

## 2018-03-10 MED ORDER — AMOXICILLIN 500 MG PO TABS: ORAL_TABLET | ORAL | 0 refills | Status: DC

## 2018-03-10 NOTE — Telephone Encounter (Signed)
Pharmacy/Dr. Irish Lack Pt with hx of MV endocarditis in 2015 after skin infection. Does he need SBE proph for colonoscopy?   Dr. Irish Lack, is it OK to hold aspirin for procedure?  Please route response back to P CV DIV PREOP

## 2018-03-10 NOTE — Telephone Encounter (Signed)
I spoke with Dr. Irish Lack.  SBE prophylaxis is not generally indicated for colonoscopy, however, with his prior severe valve infection with stroke, will have him take his usual prophylaxis of amoxicillin 2 gms 1 hour prior to procedure.  OK to hold aspirin.

## 2018-03-10 NOTE — Telephone Encounter (Signed)
   Primary Cardiologist: No primary care provider on file.  Chart reviewed as part of pre-operative protocol coverage. Patient was contacted 03/10/2018 in reference to pre-operative risk assessment for pending surgery as outlined below.  [name] was last seen on  01/12/2018 by Dr Irish Lack.  Since that day, [name] has done well with no new complaints.  Per our discussion I will send in amoxicillin for SBE prophylaxis.  Okay to hold aspirin for procedure.  Therefore, based on ACC/AHA guidelines, the patient would be at acceptable risk for the planned procedure without further cardiovascular testing.   I will route this recommendation to the requesting party via Epic fax function and remove from pre-op pool.  Please call with questions.  Daune Perch, NP 03/10/2018, 3:35 PM

## 2018-03-10 NOTE — Addendum Note (Signed)
Addended by: Daune Perch D on: 03/10/2018 03:37 PM   Modules accepted: Orders

## 2018-03-15 ENCOUNTER — Telehealth: Payer: Self-pay | Admitting: Interventional Cardiology

## 2018-03-15 NOTE — Telephone Encounter (Signed)
[name] is scheduled for a colonoscopy with his [name] GI doctor mid January (he has been cleared for this procedure and was given SBE).   Since being cleared, his GI doctor has decided to have the procedure at [facility] and administer IV abx instead of PO Amoxil to avoid endocarditis. He is concerned about having the colonoscopy at [facility] in case of complications because he does not want to have to be transferred to [facility]. He states his GI doctor states they do not have privileges to do colonoscopies at [facility] to just start there.    [name] requests a call back with [name] input on: 1) whether or not he should have colonoscopy at [facility] and risk bring transferred or 2) reestablish PCP/GI care with  and start he process over so he can have the procedure [name] at [facility]

## 2018-03-15 NOTE — Telephone Encounter (Signed)
New Message:     Pt says he is scheduled to have a Colonoscopy. He says he have some questions that he would like to discuss with Dr Irish Lack please.

## 2018-04-02 NOTE — Telephone Encounter (Signed)
Discussed with patient.  He will receive IV antibiotics prior to colonoscopy at [facility].

## 2018-04-26 ENCOUNTER — Other Ambulatory Visit: Payer: Self-pay | Admitting: Gastroenterology

## 2018-05-10 DIAGNOSIS — E7800 Pure hypercholesterolemia, unspecified: Secondary | ICD-10-CM | POA: Diagnosis not present

## 2018-05-10 DIAGNOSIS — J45909 Unspecified asthma, uncomplicated: Secondary | ICD-10-CM | POA: Diagnosis not present

## 2018-05-10 DIAGNOSIS — I10 Essential (primary) hypertension: Secondary | ICD-10-CM | POA: Diagnosis not present

## 2018-05-10 DIAGNOSIS — E119 Type 2 diabetes mellitus without complications: Secondary | ICD-10-CM | POA: Diagnosis not present

## 2018-05-19 ENCOUNTER — Other Ambulatory Visit: Payer: Self-pay | Admitting: Gastroenterology

## 2018-06-29 ENCOUNTER — Encounter (HOSPITAL_COMMUNITY): Payer: Self-pay

## 2018-06-29 ENCOUNTER — Ambulatory Visit (HOSPITAL_COMMUNITY): Admit: 2018-06-29 | Payer: BLUE CROSS/BLUE SHIELD | Admitting: Gastroenterology

## 2018-06-29 SURGERY — COLONOSCOPY WITH PROPOFOL
Anesthesia: Monitor Anesthesia Care

## 2018-07-20 ENCOUNTER — Ambulatory Visit (HOSPITAL_COMMUNITY): Admit: 2018-07-20 | Payer: BLUE CROSS/BLUE SHIELD | Admitting: Gastroenterology

## 2018-07-20 ENCOUNTER — Encounter (HOSPITAL_COMMUNITY): Payer: Self-pay

## 2018-07-20 SURGERY — COLONOSCOPY WITH PROPOFOL
Anesthesia: Monitor Anesthesia Care

## 2018-11-04 DIAGNOSIS — E7800 Pure hypercholesterolemia, unspecified: Secondary | ICD-10-CM | POA: Diagnosis not present

## 2018-11-04 DIAGNOSIS — I10 Essential (primary) hypertension: Secondary | ICD-10-CM | POA: Diagnosis not present

## 2018-11-04 DIAGNOSIS — E119 Type 2 diabetes mellitus without complications: Secondary | ICD-10-CM | POA: Diagnosis not present

## 2018-11-04 DIAGNOSIS — J45909 Unspecified asthma, uncomplicated: Secondary | ICD-10-CM | POA: Diagnosis not present

## 2018-11-23 ENCOUNTER — Other Ambulatory Visit: Payer: Self-pay | Admitting: Gastroenterology

## 2018-12-31 ENCOUNTER — Other Ambulatory Visit: Payer: Self-pay | Admitting: Gastroenterology

## 2019-01-04 ENCOUNTER — Other Ambulatory Visit (HOSPITAL_COMMUNITY)
Admission: RE | Admit: 2019-01-04 | Discharge: 2019-01-04 | Disposition: A | Discharge status: Home / Self Care | Payer: BC Managed Care – PPO | Source: Ambulatory Visit | Attending: Gastroenterology | Admitting: Gastroenterology

## 2019-01-04 DIAGNOSIS — Z20828 Contact with and (suspected) exposure to other viral communicable diseases: Secondary | ICD-10-CM | POA: Insufficient documentation

## 2019-01-04 DIAGNOSIS — Z01812 Encounter for preprocedural laboratory examination: Secondary | ICD-10-CM | POA: Insufficient documentation

## 2019-01-05 ENCOUNTER — Other Ambulatory Visit: Payer: Self-pay

## 2019-01-05 ENCOUNTER — Encounter (HOSPITAL_COMMUNITY): Payer: Self-pay | Admitting: *Deleted

## 2019-01-05 LAB — NOVEL CORONAVIRUS, NAA (HOSP ORDER, SEND-OUT TO REF LAB; TAT 18-24 HRS): SARS-CoV-2, NAA: NOT DETECTED

## 2019-01-05 NOTE — Progress Notes (Signed)
Pre-op telephone call completed for Colonoscopy on 12-07-18. Pt with history of Endocarditis. Oral amoxicillin noted in [facility], however, pt has been informed that he is to have IV antibiotics during the procedure. Pt questioning if he should take the oral antibiotic, since IV antibiotic is to be administered. Pt advised to contact Dr. Kathline Magic office for clarification regarding the oral antibiotic  Cardiac clearance also noted from Daune Perch, NP in 03-08-18 telephone encounter. Pt questioned as to whether new clearance is needed for his 01-07-19. Pt stated that cardiologiy is aware of his 01-07-19 procedure, and that his condition has remained the same since clearance was given back in December.

## 2019-01-06 NOTE — Progress Notes (Signed)
Pre op call made for endo procedure 10/9. Patient states he has been quarantined since COVID test, denies any contact with anyone sick. All questions addressed.

## 2019-01-07 ENCOUNTER — Other Ambulatory Visit: Payer: Self-pay

## 2019-01-07 ENCOUNTER — Ambulatory Visit (HOSPITAL_COMMUNITY): Payer: BC Managed Care – PPO | Admitting: Physician Assistant

## 2019-01-07 ENCOUNTER — Encounter (HOSPITAL_COMMUNITY): Payer: Self-pay | Admitting: *Deleted

## 2019-01-07 ENCOUNTER — Encounter (HOSPITAL_COMMUNITY)
Admission: RE | Disposition: A | Discharge status: Home / Self Care | Payer: Self-pay | Source: Ambulatory Visit | Attending: Gastroenterology

## 2019-01-07 ENCOUNTER — Ambulatory Visit (HOSPITAL_COMMUNITY)
Admission: RE | Admit: 2019-01-07 | Discharge: 2019-01-07 | Disposition: A | Discharge status: Home / Self Care | Payer: BC Managed Care – PPO | Source: Ambulatory Visit | Attending: Gastroenterology | Admitting: Gastroenterology

## 2019-01-07 DIAGNOSIS — J45909 Unspecified asthma, uncomplicated: Secondary | ICD-10-CM | POA: Insufficient documentation

## 2019-01-07 DIAGNOSIS — K449 Diaphragmatic hernia without obstruction or gangrene: Secondary | ICD-10-CM | POA: Insufficient documentation

## 2019-01-07 DIAGNOSIS — M1990 Unspecified osteoarthritis, unspecified site: Secondary | ICD-10-CM | POA: Diagnosis not present

## 2019-01-07 DIAGNOSIS — K635 Polyp of colon: Secondary | ICD-10-CM | POA: Diagnosis not present

## 2019-01-07 DIAGNOSIS — K5669 Other partial intestinal obstruction: Secondary | ICD-10-CM | POA: Diagnosis not present

## 2019-01-07 DIAGNOSIS — E119 Type 2 diabetes mellitus without complications: Secondary | ICD-10-CM | POA: Diagnosis not present

## 2019-01-07 DIAGNOSIS — D122 Benign neoplasm of ascending colon: Secondary | ICD-10-CM | POA: Diagnosis not present

## 2019-01-07 DIAGNOSIS — C184 Malignant neoplasm of transverse colon: Secondary | ICD-10-CM | POA: Diagnosis not present

## 2019-01-07 DIAGNOSIS — Z1211 Encounter for screening for malignant neoplasm of colon: Secondary | ICD-10-CM

## 2019-01-07 DIAGNOSIS — I10 Essential (primary) hypertension: Secondary | ICD-10-CM | POA: Diagnosis not present

## 2019-01-07 DIAGNOSIS — K640 First degree hemorrhoids: Secondary | ICD-10-CM | POA: Diagnosis not present

## 2019-01-07 HISTORY — PX: COLONOSCOPY WITH PROPOFOL: SHX5780

## 2019-01-07 HISTORY — PX: SUBMUCOSAL INJECTION: SHX5543

## 2019-01-07 HISTORY — PX: BIOPSY: SHX5522

## 2019-01-07 LAB — GLUCOSE, CAPILLARY: Glucose-Capillary: 165 mg/dL — ABNORMAL HIGH (ref 70–99)

## 2019-01-07 SURGERY — COLONOSCOPY WITH PROPOFOL
Anesthesia: Monitor Anesthesia Care

## 2019-01-07 MED ORDER — SODIUM CHLORIDE 0.9 % IV SOLN
INTRAVENOUS | Status: DC
  Administered 2019-01-07: 08:00:00 1000 mL via INTRAVENOUS

## 2019-01-07 MED ORDER — SPOT INK MARKER SYRINGE KIT
PACK | SUBMUCOSAL | Status: AC
  Filled 2019-01-07: qty 5

## 2019-01-07 MED ORDER — PROPOFOL 500 MG/50ML IV EMUL
INTRAVENOUS | Status: DC | PRN
  Administered 2019-01-07: 150 ug/kg/min via INTRAVENOUS

## 2019-01-07 MED ORDER — PROPOFOL 10 MG/ML IV BOLUS
INTRAVENOUS | Status: DC | PRN
  Administered 2019-01-07: 30 mg via INTRAVENOUS
  Administered 2019-01-07: 20 mg via INTRAVENOUS

## 2019-01-07 MED ORDER — PROPOFOL 10 MG/ML IV BOLUS
INTRAVENOUS | Status: AC
  Filled 2019-01-07: qty 40

## 2019-01-07 MED ORDER — SPOT INK MARKER SYRINGE KIT
PACK | SUBMUCOSAL | Status: DC | PRN
  Administered 2019-01-07: 6 mL via SUBMUCOSAL

## 2019-01-07 MED ORDER — SODIUM CHLORIDE 0.9 % IV SOLN
2.0000 g | Freq: Once | INTRAVENOUS | Status: AC
  Administered 2019-01-07: 2 g via INTRAVENOUS
  Filled 2019-01-07: qty 2

## 2019-01-07 SURGICAL SUPPLY — 21 items

## 2019-01-07 NOTE — Anesthesia Procedure Notes (Signed)
Procedure Name: MAC Date/Time: 01/07/2019 9:06 AM Performed by: Claudia Desanctis, CRNA Pre-anesthesia Checklist: Patient identified, Emergency Drugs available, Suction available and Patient being monitored Oxygen Delivery Method: Simple face mask

## 2019-01-07 NOTE — H&P (Signed)
Date of Initial H&P: 12/31/18  History reviewed, patient examined, no change in status, stable for surgery.

## 2019-01-07 NOTE — Anesthesia Postprocedure Evaluation (Signed)
Anesthesia Post Note  Patient: [name]  Procedure(s) Performed: COLONOSCOPY WITH PROPOFOL (N/A ) BIOPSY SUBMUCOSAL INJECTION     Patient location during evaluation: PACU Anesthesia Type: MAC Level of consciousness: awake and alert Pain management: pain level controlled Vital Signs Assessment: post-procedure vital signs reviewed and stable Respiratory status: spontaneous breathing, nonlabored ventilation, respiratory function stable and patient connected to nasal cannula oxygen Cardiovascular status: stable and blood pressure returned to baseline Postop Assessment: no apparent nausea or vomiting Anesthetic complications: no    Last Vitals:  Vitals:   01/07/19 0956 01/07/19 1010  BP: 126/74 (!) 156/86  Pulse: 73 63  Resp:  (!) 21  Temp: 36.8 C   SpO2: 100% 100%    Last Pain:  Vitals:   01/07/19 0956  TempSrc: Temporal  PainSc: 0-No pain                 [name]

## 2019-01-07 NOTE — Anesthesia Preprocedure Evaluation (Signed)
Anesthesia Evaluation  Patient identified by MRN, date of birth, ID band Patient awake    Reviewed: Allergy & Precautions, NPO status , Patient's Chart, lab work & pertinent test results  History of Anesthesia Complications (+) PONV  Airway Mallampati: I  TM Distance: >3 FB Neck ROM: Full    Dental  (+) Teeth Intact, Dental Advisory Given   Pulmonary asthma ,    breath sounds clear to auscultation       Cardiovascular hypertension, Pt. on medications  Rhythm:Regular Rate:Normal     Neuro/Psych negative neurological ROS  negative psych ROS   GI/Hepatic hiatal hernia,   Endo/Other  diabetes, Type 2, Oral Hypoglycemic Agents  Renal/GU      Musculoskeletal  (+) Arthritis ,   Abdominal Normal abdominal exam  (+)   Peds  Hematology   Anesthesia Other Findings   Reproductive/Obstetrics negative OB ROS                             Anesthesia Physical Anesthesia Plan  ASA: II  Anesthesia Plan: MAC   Post-op Pain Management:    Induction: Intravenous  PONV Risk Score and Plan: 0 and Propofol infusion  Airway Management Planned: Natural Airway and Simple Face Mask  Additional Equipment: None  Intra-op Plan:   Post-operative Plan:   Informed Consent: I have reviewed the patients History and Physical, chart, labs and discussed the procedure including the risks, benefits and alternatives for the proposed anesthesia with the patient or authorized representative who has indicated his/her understanding and acceptance.       Plan Discussed with: CRNA  Anesthesia Plan Comments:         Anesthesia Quick Evaluation

## 2019-01-07 NOTE — Discharge Instructions (Addendum)

## 2019-01-07 NOTE — Interval H&P Note (Signed)
History and Physical Interval Note:  01/07/2019 9:03 AM  [name]  has presented today for surgery, with the diagnosis of Screening.  The various methods of treatment have been discussed with the patient and family. After consideration of risks, benefits and other options for treatment, the patient has consented to  Procedure(s): COLONOSCOPY WITH PROPOFOL (N/A) as a surgical intervention.  The patient's history has been reviewed, patient examined, no change in status, stable for surgery.  I have reviewed the patient's chart and labs.  Questions were answered to the patient's satisfaction.     [name]

## 2019-01-07 NOTE — Op Note (Signed)
[facility] Patient Name: [name] Procedure Date: 01/07/2019 MRN: [id number] Attending MD: [name] , MD Date of Birth: 1966-03-07 CSN: [id number] Age: 53 Admit Type: Outpatient Procedure:                Colonoscopy Indications:              Screening for colorectal malignant neoplasm, This                            is the patient's first colonoscopy Providers:                [name], MD, Cleda Daub, RN,                            William Dalton, Technician Referring MD:             Orpah Melter Medicines:                Propofol per Anesthesia, Ampicillin 2 g IV,                            Monitored Anesthesia Care Complications:            No immediate complications. Estimated Blood Loss:     Estimated blood loss was minimal. Procedure:                Pre-Anesthesia Assessment:                           - Prior to the procedure, a History and Physical                            was performed, and patient medications and                            allergies were reviewed. The patient's tolerance of                            previous anesthesia was also reviewed. The risks                            and benefits of the procedure and the sedation                            options and risks were discussed with the patient.                            All questions were answered, and informed consent                            was obtained. Prior Anticoagulants: The patient has                            taken no previous anticoagulant or antiplatelet  agents. ASA Grade Assessment: II - A patient with                            mild systemic disease. After reviewing the risks                            and benefits, the patient was deemed in                            satisfactory condition to undergo the procedure.                           After obtaining informed consent, the colonoscope           was passed under direct vision. Throughout the                            procedure, the patient's blood pressure, pulse, and                            oxygen saturations were monitored continuously. The                            [id number] Olympus pediatric colonscope                            was introduced through the anus and advanced to the                            the cecum, identified by appendiceal orifice and                            ileocecal valve. The colonoscopy was performed with                            difficulty due to a partially obstructing mass.                            Successful completion of the procedure was aided by                            straightening and shortening the scope to obtain                            bowel loop reduction. The patient tolerated the                            procedure well. The quality of the bowel                            preparation was inadequate. The terminal ileum,                            ileocecal valve, appendiceal orifice, and rectum  were photographed. Scope In: 9:14:11 AM Scope Out: 9:48:11 AM Scope Withdrawal Time: 0 hours 22 minutes 36 seconds  Total Procedure Duration: 0 hours 34 minutes 0 seconds  Findings:      The perianal and digital rectal examinations were normal.      A fungating, infiltrative and ulcerated partially obstructing large mass       was found in the transverse colon. The mass was circumferential. The       mass measured five cm in length. Oozing was present. Biopsies were taken       with a cold forceps for histology. Estimated blood loss was minimal.       Area was tattooed with an injection of Spot (carbon black) - 3 cc at the       proximal end and 3 cc at the distal end.      A small polyp was found in the ascending colon. The polyp was sessile.       Polypectomy was not attempted due to previously identified malignancy.      A small  polyp was found in the ascending colon. The polyp was sessile.       Polypectomy was not attempted due to previously identified malignancy.      Internal hemorrhoids were found during retroflexion. The hemorrhoids       were medium-sized and Grade I (internal hemorrhoids that do not       prolapse).      The terminal ileum appeared normal. Impression:               - Preparation of the colon was inadequate.                           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 Biopsied. Tattooed.                           - One small polyp in the ascending colon. Resection                            not attempted.                           - One small polyp in the ascending colon. Resection                            not attempted.                           - Internal hemorrhoids.                           - The examined portion of the ileum was normal. Moderate Sedation:      Not Applicable - Patient had care per Anesthesia. Recommendation:           - Patient  has a contact number available for                            emergencies. The signs and symptoms of potential                            delayed complications were discussed with the                            patient. Return to normal activities tomorrow.                            Written discharge instructions were provided to the                            patient.                           - Resume previous diet.                           - Await pathology results.                           - Refer to a surgeon at appointment to be scheduled.                           - Repeat colonoscopy for surveillance based on                            pathology results. Procedure Code(s):        --- Professional ---                           [phone], Colonoscopy, flexible; with directed                             submucosal injection(s), any substance                           [id number], Colonoscopy, flexible; with biopsy, single                            or multiple Diagnosis Code(s):        --- Professional ---                           Z12.11, Encounter for screening for malignant                            neoplasm of colon                           C18.4, Malignant neoplasm of transverse colon                           K56.690, Other partial intestinal obstruction  K63.5, Polyp of colon                           K64.0, First degree hemorrhoids CPT copyright 2019 [employer]. All rights reserved. The codes documented in this report are preliminary and upon coder review may  be revised to meet current compliance requirements. [name], MD 01/07/2019 10:05:48 AM This report has been signed electronically. Number of Addenda: 0

## 2019-01-07 NOTE — Transfer of Care (Signed)
Immediate Anesthesia Transfer of Care Note  Patient: [name]  Procedure(s) Performed: COLONOSCOPY WITH PROPOFOL (N/A ) BIOPSY SUBMUCOSAL INJECTION  Patient Location: Endoscopy Unit  Anesthesia Type:MAC  Level of Consciousness: awake and patient cooperative  Airway & Oxygen Therapy: Patient Spontanous Breathing and Patient connected to face mask  Post-op Assessment: Report given to RN and Post -op Vital signs reviewed and stable  Post vital signs: Reviewed and stable  Last Vitals:  Vitals Value Taken Time  BP 126/74 01/07/19 0956  Temp 36.8 C 01/07/19 0956  Pulse 69 01/07/19 0957  Resp 19 01/07/19 0957  SpO2 100 % 01/07/19 0957  Vitals shown include unvalidated device data.  Last Pain:  Vitals:   01/07/19 0956  TempSrc: Temporal  PainSc: 0-No pain         Complications: No apparent anesthesia complications

## 2019-01-08 ENCOUNTER — Encounter (HOSPITAL_COMMUNITY): Payer: Self-pay

## 2019-01-08 ENCOUNTER — Other Ambulatory Visit: Payer: Self-pay

## 2019-01-08 ENCOUNTER — Emergency Department (HOSPITAL_COMMUNITY): Payer: BC Managed Care – PPO

## 2019-01-08 ENCOUNTER — Emergency Department (HOSPITAL_COMMUNITY)
Admission: EM | Admit: 2019-01-08 | Discharge: 2019-01-08 | Disposition: A | Discharge status: Home / Self Care | Payer: BC Managed Care – PPO | Attending: Emergency Medicine | Admitting: Emergency Medicine

## 2019-01-08 DIAGNOSIS — I10 Essential (primary) hypertension: Secondary | ICD-10-CM | POA: Diagnosis not present

## 2019-01-08 DIAGNOSIS — Z7982 Long term (current) use of aspirin: Secondary | ICD-10-CM | POA: Diagnosis not present

## 2019-01-08 DIAGNOSIS — Z79899 Other long term (current) drug therapy: Secondary | ICD-10-CM | POA: Diagnosis not present

## 2019-01-08 DIAGNOSIS — K6389 Other specified diseases of intestine: Secondary | ICD-10-CM | POA: Diagnosis not present

## 2019-01-08 DIAGNOSIS — E119 Type 2 diabetes mellitus without complications: Secondary | ICD-10-CM | POA: Diagnosis not present

## 2019-01-08 DIAGNOSIS — G8918 Other acute postprocedural pain: Secondary | ICD-10-CM | POA: Diagnosis not present

## 2019-01-08 DIAGNOSIS — R109 Unspecified abdominal pain: Secondary | ICD-10-CM | POA: Diagnosis not present

## 2019-01-08 DIAGNOSIS — M545 Low back pain: Secondary | ICD-10-CM | POA: Diagnosis not present

## 2019-01-08 LAB — CBC WITH DIFFERENTIAL/PLATELET
Abs Immature Granulocytes: 0.04 10*3/uL (ref 0.00–0.07)
Basophils Absolute: 0 10*3/uL (ref 0.0–0.1)
Basophils Relative: 0 %
Eosinophils Absolute: 0.1 10*3/uL (ref 0.0–0.5)
Eosinophils Relative: 1 %
HCT: 47 % (ref 39.0–52.0)
Hemoglobin: 15.8 g/dL (ref 13.0–17.0)
Immature Granulocytes: 0 %
Lymphocytes Relative: 10 %
Lymphs Abs: 0.9 10*3/uL (ref 0.7–4.0)
MCH: 31.7 pg (ref 26.0–34.0)
MCHC: 33.6 g/dL (ref 30.0–36.0)
MCV: 94.2 fL (ref 80.0–100.0)
Monocytes Absolute: 0.8 10*3/uL (ref 0.1–1.0)
Monocytes Relative: 9 %
Neutro Abs: 7.1 10*3/uL (ref 1.7–7.7)
Neutrophils Relative %: 80 %
Platelets: 148 10*3/uL — ABNORMAL LOW (ref 150–400)
RBC: 4.99 MIL/uL (ref 4.22–5.81)
RDW: 11.9 % (ref 11.5–15.5)
WBC: 9 10*3/uL (ref 4.0–10.5)
nRBC: 0 % (ref 0.0–0.2)

## 2019-01-08 LAB — COMPREHENSIVE METABOLIC PANEL
ALT: 23 U/L (ref 0–44)
AST: 19 U/L (ref 15–41)
Albumin: 4 g/dL (ref 3.5–5.0)
Alkaline Phosphatase: 49 U/L (ref 38–126)
Anion gap: 10 (ref 5–15)
BUN: 12 mg/dL (ref 6–20)
CO2: 24 mmol/L (ref 22–32)
Calcium: 9 mg/dL (ref 8.9–10.3)
Chloride: 103 mmol/L (ref 98–111)
Creatinine, Ser: 1.06 mg/dL (ref 0.61–1.24)
GFR calc Af Amer: >60 mL/min (ref >60)
GFR calc non Af Amer: >60 mL/min (ref >60)
Glucose, Bld: 175 mg/dL — ABNORMAL HIGH (ref 70–99)
Potassium: 3.9 mmol/L (ref 3.5–5.1)
Sodium: 137 mmol/L (ref 135–145)
Total Bilirubin: 1.8 mg/dL — ABNORMAL HIGH (ref 0.3–1.2)
Total Protein: 7 g/dL (ref 6.5–8.1)

## 2019-01-08 MED ORDER — SODIUM CHLORIDE 0.9 % IV SOLN: INTRAVENOUS | Status: DC

## 2019-01-08 MED ORDER — SODIUM CHLORIDE (PF) 0.9 % IJ SOLN
INTRAMUSCULAR | Status: AC
  Filled 2019-01-08: qty 50

## 2019-01-08 MED ORDER — IOHEXOL 300 MG/ML  SOLN
100.0000 mL | Freq: Once | INTRAMUSCULAR | Status: AC | PRN
  Administered 2019-01-08: 12:00:00 100 mL via INTRAVENOUS

## 2019-01-08 MED ORDER — METHOCARBAMOL 750 MG PO TABS: 750.0000 mg | ORAL_TABLET | Freq: Four times a day (QID) | ORAL | 0 refills | Status: DC

## 2019-01-08 NOTE — ED Triage Notes (Signed)
Pt presents with c/o lower back pain after a colonoscopy yesterday. Pt reports the procedure was yesterday morning and he started having low back pain yesterday afternoon. Pt contacted the surgeon and he reported that he wanted him to come here for a CT. Pt also reports that his back feels "puffy".

## 2019-01-08 NOTE — ED Notes (Signed)
Patient transported to CT 

## 2019-01-08 NOTE — Discharge Instructions (Signed)
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

## 2019-01-08 NOTE — ED Provider Notes (Signed)
[facility] DEPT Provider Note   CSN: [id number] Arrival date & time: 01/08/19  [id number]     History   Chief Complaint Chief Complaint  Patient presents with  . Post-op Problem    HPI [name] is a 53 y.o. male.     53 year old male presents with left-sided flank pain.  Patient denies any abdominal pain.  No fever chills.  No vomiting.  Patient had a colonoscopy yesterday and there was a mass found.  He denies any rectal bleeding.  No fever chills per denies any urinary symptoms.  Does have history of back pain in the past.  Spoke to his gastroenterologist, Dr. Michail Sermon, who recommends abdominal CT for further evaluation.     Past Medical History:  Diagnosis Date  . Allergic rhinitis   . Asthma    animal dander & seasonal asthma  . Diabetes mellitus without complication ([place])    dx 2010  . Discitis of lumbosacral region    first started with this ..and rolled onto the endocarditis    stayed a month  . 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 H/O scoliosis   . History of hiatal hernia   . Hyperlipidemia   . Hypertension    Diagnostic exercise tolerance test assessment:04/30/2010 : comments normal -no evidence os ischemia by ST analysis  . lipoma   . PONV (postoperative nausea and vomiting)   . Solitary kidney   . 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 Screen for colon cancer 01/07/2019  . Diabetes mellitus ([place]) 01/11/2015  . Fever 08/17/2014  . Discitis   . Mitral regurgitation 07/06/2014  . Discitis of lumbar region 03/08/2014  . 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 Enterococcal bacteremia 03/07/2014  . 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 Solitary kidney, acquired 03/07/2014  . Acute encephalopathy 03/07/2014  . Acute bacterial endocarditis   . Malnutrition of moderate degree ([place]) 03/01/2014  .  Cardiomyopathy- EF 30-35% 03/01/2014  . CVA (cerebral infarction)- embolic [id number]  . Acute respiratory failure with hypoxia ([place])   . Severe sepsis ([place])   . Back pain 02/27/2014  . Intractable back pain 02/27/2014  . Hyperglycemia 02/27/2014  . DM (diabetes mellitus), type 2, uncontrolled ([place]) 02/27/2014  . Hyperlipidemia 02/27/2014  . Family history of ischemic heart disease 04/05/2013  . HYPERTENSION, BENIGN 03/12/2008  . PALPITATIONS 03/12/2008    Past Surgical History:  Procedure Laterality Date  . INGUINAL HERNIA REPAIR Left 04/23/2016   Procedure: LAPAROSCOPIC  INGUINAL REPAIR;  Surgeon: Clovis Riley, MD;  Location: [facility];  Service: General;  Laterality: Left;  . KNEE SURGERY    . LIPOMA EXCISION    . THYROID SURGERY    . TONSILLECTOMY    . TOTAL NEPHRECTOMY Left         Home Medications    Prior to Admission medications   Medication Sig Start Date End Date Taking? Authorizing Provider  albuterol (PROVENTIL HFA;VENTOLIN HFA) 108 (90 BASE) MCG/ACT inhaler Inhale 1 puff into the lungs every 6 (six) hours as needed for wheezing or shortness of breath.   Yes [provider]  aspirin EC 81 MG tablet Take 81 mg by mouth every evening.    Yes [provider]  atorvastatin (LIPITOR) 10 MG tablet Take 1 tablet (10 mg total) by mouth daily. 03/15/14  Yes Angiulli, Lavon Paganini, PA-C  calcium carbonate (TUMS - DOSED IN MG ELEMENTAL CALCIUM) 500 MG chewable tablet Chew 1 tablet by mouth daily as needed for indigestion or heartburn.   Yes [provider]  lisinopril (PRINIVIL,ZESTRIL) 2.5 MG tablet Take 1 tablet (2.5 mg total) by mouth daily. 03/15/14  Yes Angiulli, Lavon Paganini, PA-C  metFORMIN (GLUCOPHAGE) 1000 MG tablet Take 1,000 mg by mouth 2 (two) times daily.  12/20/14  Yes [provider]  mometasone (ASMANEX) 220 MCG/INH inhaler Inhale 1 puff into the lungs daily.   Yes [provider]  Multiple Vitamin (MULTIVITAMIN WITH MINERALS)  TABS tablet Take 1 tablet by mouth 2 (two) times daily.   Yes [provider]  Omega-3 Fatty Acids (FISH OIL) 1000 MG CAPS Take 1,000 mg by mouth 2 (two) times daily.   Yes [provider]  repaglinide (PRANDIN) 1 MG tablet Take 1 mg by mouth 2 (two) times daily before a meal.    Yes [provider]  TOPROL XL 25 MG 24 hr tablet Take 25 mg by mouth daily.  07/07/14  Yes [provider]  amoxicillin (AMOXIL) 500 MG tablet Take all 4 tablets (2000 mg) 1 hour prior to surgery Patient not taking: Reported on 01/08/2019 03/10/18   Daune Perch, NP  ONE TOUCH ULTRA TEST test strip 1 each by Other route as needed (GLUCOSE).  05/06/15   [provider]    Family History Family History  Problem Relation Age of Onset  . Heart disease Father   . Hernia Father   . Healthy Sister   . Cancer Mother        THYROID  . Hypotension Mother   . Heart attack Maternal Grandfather   . Heart attack Paternal Grandfather     Social History Social History   Tobacco Use  . Smoking status: Never Smoker  . Smokeless tobacco: Never Used  Substance Use Topics  . Alcohol use: No  . Drug use: No     Allergies   No known allergies and Other   Review of Systems Review of Systems  All other systems reviewed and are negative.    Physical Exam Updated Vital Signs BP (!) 201/109   Pulse (!) 102   Temp 98.9 F (37.2 C) (Oral)   Resp 18   Ht 1.829 m (6')   Wt 93 kg   SpO2 97%   BMI 27.80 kg/m   Physical Exam Vitals signs and nursing note reviewed.  Constitutional:      General: He is not in acute distress.    Appearance: Normal appearance. He is well-developed. He is not toxic-appearing.  HENT:     Head: Normocephalic and atraumatic.  Eyes:     General: Lids are normal.     Conjunctiva/sclera: Conjunctivae normal.     Pupils: Pupils are equal, round, and reactive to light.  Neck:     Musculoskeletal: Normal range of motion and neck supple.      Thyroid: No thyroid mass.     Trachea: No tracheal deviation.  Cardiovascular:     Rate and Rhythm: Normal rate and regular rhythm.     Heart sounds: Normal heart sounds. No murmur. No gallop.   Pulmonary:     Effort: Pulmonary effort is normal. No respiratory distress.     Breath sounds: Normal breath sounds. No stridor. No decreased breath sounds, wheezing, rhonchi or rales.  Abdominal:     General: Bowel sounds are normal. There is  no distension.     Palpations: Abdomen is soft.     Tenderness: There is no abdominal tenderness. There is no rebound.  Musculoskeletal: Normal range of motion.     Lumbar back: He exhibits tenderness. He exhibits normal range of motion, no bony tenderness and no swelling.       Back:  Skin:    General: Skin is warm and dry.     Findings: No abrasion or rash.  Neurological:     Mental Status: He is alert and oriented to person, place, and time.     GCS: GCS eye subscore is 4. GCS verbal subscore is 5. GCS motor subscore is 6.     Cranial Nerves: No cranial nerve deficit.     Sensory: No sensory deficit.  Psychiatric:        Speech: Speech normal.        Behavior: Behavior normal.      ED Treatments / Results  Labs (all labs ordered are listed, but only abnormal results are displayed) Labs Reviewed  CBC WITH DIFFERENTIAL/PLATELET  COMPREHENSIVE METABOLIC PANEL    EKG None  Radiology No results found.  Procedures Procedures (including critical care time)  Medications Ordered in ED Medications  0.9 %  sodium chloride infusion ( Intravenous New Bag/Given 01/08/19 1009)     Initial Impression / Assessment and Plan / ED Course  I have reviewed the triage vital signs and the nursing notes.  Pertinent labs & imaging results that were available during my care of the patient were reviewed by me and considered in my medical decision making (see chart for details).        Patient's abdominal CT negative for perforation.  Splenic and  lung findings noted and patient given instructions on follow-up for this.  Final Clinical Impressions(s) / ED Diagnoses   Final diagnoses:  None    ED Discharge Orders    None       Lacretia Leigh, MD 01/08/19 1340

## 2019-01-10 ENCOUNTER — Encounter (HOSPITAL_COMMUNITY): Payer: Self-pay | Admitting: Gastroenterology

## 2019-01-11 ENCOUNTER — Ambulatory Visit: Payer: Self-pay | Admitting: Surgery

## 2019-01-11 DIAGNOSIS — D171 Benign lipomatous neoplasm of skin and subcutaneous tissue of trunk: Secondary | ICD-10-CM | POA: Diagnosis not present

## 2019-01-11 DIAGNOSIS — R161 Splenomegaly, not elsewhere classified: Secondary | ICD-10-CM | POA: Diagnosis not present

## 2019-01-11 DIAGNOSIS — C184 Malignant neoplasm of transverse colon: Secondary | ICD-10-CM | POA: Diagnosis not present

## 2019-01-11 DIAGNOSIS — K432 Incisional hernia without obstruction or gangrene: Secondary | ICD-10-CM | POA: Diagnosis not present

## 2019-01-11 NOTE — H&P (Signed)
Fabiola Backer Documented: 01/11/2019 12:35 PM Location: [facility] Patient #: [id number] DOB: 20-Jan-1966 Married / Language: [name] / Race: White Male  History of Present Illness [name] MD; 01/11/2019 2:01 PM) The patient is a 53 year old male who presents with colorectal cancer. Note for "Colorectal cancer": ` ` ` Patient sent for surgical consultation at the request of Dr Michail Sermon  Chief Complaint: Newly diagnosed colon cancer. ` ` The patient is a pleasant male who underwent his first screening colonoscopy at age 65. A few small polyps removed. Bulky mass noted in his transverse colon about 80 cm from the anus. Highly suspicious for cancer. Surgical consultation requested. We fit him in the following week. Patient comes in today by himself. 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 Has had some secondary scoliosis as a result. 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 No cancer. His history of subcutaneous masses consistent with lipomas as well. He is a diabetic. Hemoglobin A1c around 7-8. Not needing any insulin. Question history of some heart valve issues but nothing too severe. On aspirin. Followed by Dr. Irish Lack in the past. Patient exercises at the gym. He can walk 5 miles without difficulty. He does not smoke. Usually moves his bowels twice a day. No family history of bowel issues that he is aware of.  No personal nor family history of inflammatory bowel disease, irritable bowel syndrome, allergy such as Celiac Sprue, dietary/dairy problems, colitis, ulcers nor gastritis. No recent sick contacts/gastroenteritis. No travel outside the country. No changes in diet. No dysphagia to solids or liquids. No significant heartburn or reflux. No hematochezia, hematemesis, coffee ground emesis. No evidence of prior  gastric/peptic ulceration.  (Review of systems as stated in this history (HPI) or in the review of systems. Otherwise all other 12 point ROS are negative) ` ` `   DIAGNOSIS:  A. 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  COMMENT:   Immunohistochemistry for CDX-2 is positive. Synaptophysin, Chromogranin and CD56 are negative. Mismatch repair proteins will be reported separately. Results reported to [facility] in the Office of Dr. Wilford Corner on 01/11/2019. Intradepartmental consultation (Dr. Saralyn Pilar).  Jerzy Crotteau DESCRIPTION:  Received in formalin are tan, soft tissue fragments that are submitted in toto. Number: MP Size: 0.2-0.4 cm Blocks: 1    Final Diagnosis performed by Gillie Manners, MD. Electronically signed 01/11/2019 Technical component performed at [facility], [place] [address] [place], [place] [address]. Professional component performed at [employer]. [facility] [place], [place] [address], [place], [place] [address]. Immunohistochemistry Technical component (if applicable) was performed at [facility]. [address] [place], [place],  [address]. IMMUNOHISTOCHEMISTRY DISCLAIMER (if applicable): Some of these immunohistochemical stains may have been developed and the performance characteristics determine by [facility]. Some may not have been cleared or approved by the U.S. Food and Drug Administration. The FDA has determined that such clearance or approval is not necessary. This test is used for clinical purposes. It should not be regarded as investigational or for research. This laboratory is certified under the [place] (CLIA-88) as qualified to perform high complexity clinical laboratory testing. The controls stained appropriately.   Past Surgical History Sallyanne Kuster, CMA; 01/11/2019 12:35  PM) Knee Surgery Left. Laparoscopic Inguinal Hernia Surgery Left. Nephrectomy Left. Open Inguinal Hernia Surgery Left.  Thyroid Surgery  Diagnostic Studies History Sallyanne Kuster, [place]; 01/11/2019 12:35 PM) Colonoscopy within last year never  Allergies Sallyanne Kuster, [place]; 01/11/2019 12:36 PM) No Known Drug Allergies [02/15/2016]: Allergies Reconciled  Medication History Sallyanne Kuster, CMA; 01/11/2019 12:41 PM) Tums (500MG Tablet Chewable, Oral) Active. Multivitamins/Minerals (Oral) Active. Fish Oil (1000MG Capsule, Oral) Active. Atorvastatin Calcium (10MG Tablet, Oral) Active. Lisinopril (2.5MG Tablet, Oral) Active. MetFORMIN HCl (1000MG Tablet, Oral) Active. Asmanex 30 Metered Doses (220MCG/INH Aero Pow Br Act, Inhalation) Active. Repaglinide (1MG Tablet, Oral) Active. Toprol XL (25MG Tablet ER 24HR, Oral) Active. ProAir Digihaler (108MCG/ACT Aero Pow Br Act, Inhalation) Active. ProAir HFA (108 (90 Base)MCG/ACT Aerosol Soln, Inhalation) Active. Aspirin (81MG Tablet, Oral) Active. Medications Reconciled  Social History Sallyanne Kuster, [place]; 01/11/2019 12:35 PM) Alcohol use Occasional alcohol use. Caffeine use Carbonated beverages, Coffee, Tea. No drug use Tobacco use Never smoker.  Family History Sallyanne Kuster, [place]; 01/11/2019 12:35 PM) Heart Disease Father. Hypertension Father. Thyroid problems Mother.  Other Problems Sallyanne Kuster, CMA; 01/11/2019 12:35 PM) Asthma Cancer Diabetes Mellitus Gastric Ulcer High blood pressure Inguinal Hernia Other disease, cancer, significant illness     Review of Systems Sallyanne Kuster CMA; 01/11/2019 12:35 PM) General Not Present- Appetite Loss, Chills, Fatigue, Fever, Night Sweats, Weight Gain and Weight Loss. Skin Not Present- Change in Wart/Mole, Dryness, Hives, Jaundice, New Lesions, Non-Healing Wounds, Rash and Ulcer. HEENT Present- Seasonal Allergies and Wears glasses/contact lenses. Not Present-  Earache, Hearing Loss, Hoarseness, Nose Bleed, Oral Ulcers, Ringing in the Ears, Sinus Pain, Sore Throat, Visual Disturbances and Yellow Eyes. Respiratory Present- Snoring. Not Present- Bloody sputum, Chronic Cough, Difficulty Breathing and Wheezing. Breast Not Present- Breast Mass, Breast Pain, Nipple Discharge and Skin Changes. Cardiovascular Not Present- Chest Pain, Difficulty Breathing Lying Down, Leg Cramps, Palpitations, Rapid Heart Rate, Shortness of Breath and Swelling of Extremities. Gastrointestinal Not Present- Abdominal Pain, Bloating, Bloody Stool, Change in Bowel Habits, Chronic diarrhea, Constipation, Difficulty Swallowing, Excessive gas, Gets full quickly at meals, Hemorrhoids, Indigestion, Nausea, Rectal Pain and Vomiting. Male Genitourinary Not Present- Blood in Urine, Change in Urinary Stream, Frequency, Impotence, Nocturia, Painful Urination, Urgency and Urine Leakage. Musculoskeletal Present- Joint Pain and Joint Stiffness. Not Present- Back Pain, Muscle Pain, Muscle Weakness and Swelling of Extremities. Neurological Not Present- Decreased Memory, Fainting, Headaches, Numbness, Seizures, Tingling, Tremor, Trouble walking and Weakness. Psychiatric Not Present- Anxiety, Bipolar, Change in Sleep Pattern, Depression, Fearful and Frequent crying. Endocrine Not Present- Cold Intolerance, Excessive Hunger, Hair Changes, Heat Intolerance, Hot flashes and New Diabetes. Hematology Not Present- Blood Thinners, Easy Bruising, Excessive bleeding, Gland problems, HIV and Persistent Infections.  Vitals Sallyanne Kuster CMA; 01/11/2019 12:42 PM) 01/11/2019 12:41 PM Weight: 207 lb Height: 72in Body Surface Area: 2.16 m Body Mass Index: 28.07 kg/m  Temp.: 97.81F  Pulse: 93 (Regular)  BP: 150/100 (Sitting, Left Arm, Standard)        Physical Exam [name] MD; 01/11/2019 1:46 PM)  General Mental Status-Alert. General Appearance-Not in acute distress, Not  Sickly. Orientation-Oriented X3. Hydration-Well hydrated. Voice-Normal.  Integumentary Global Assessment Upon inspection and palpation of skin surfaces of the - Axillae: non-tender, no inflammation or ulceration, no drainage. and Distribution of scalp and body hair is normal. General Characteristics Temperature - normal warmth is noted.  Head and Neck Head-normocephalic, atraumatic with no lesions or palpable masses. Face Global Assessment - atraumatic, no absence of expression. Neck Global Assessment - no abnormal movements, no bruit auscultated on the right, no bruit auscultated on the left, no decreased range of  motion, non-tender. Trachea-midline. Thyroid Gland Characteristics - non-tender.  Eye Eyeball - Left-Extraocular movements intact, No Nystagmus - Left. Eyeball - Right-Extraocular movements intact, No Nystagmus - Right. Cornea - Left-No Hazy - Left. Cornea - Right-No Hazy - Right. Sclera/Conjunctiva - Left-No scleral icterus, No Discharge - Left. Sclera/Conjunctiva - Right-No scleral icterus, No Discharge - Right. Pupil - Left-Direct reaction to light normal. Pupil - Right-Direct reaction to light normal.  ENMT Ears Pinna - Left - no drainage observed, no generalized tenderness observed. Pinna - Right - no drainage observed, no generalized tenderness observed. Nose and Sinuses External Inspection of the Nose - no destructive lesion observed. Inspection of the nares - Left - quiet respiration. Inspection of the nares - Right - quiet respiration. Mouth and Throat Lips - Upper Lip - no fissures observed, no pallor noted. Lower Lip - no fissures observed, no pallor noted. Nasopharynx - no discharge present. Oral Cavity/Oropharynx - Tongue - no dryness observed. Oral Mucosa - no cyanosis observed. Hypopharynx - no evidence of airway distress observed.  Chest and Lung Exam Inspection Movements - Normal and Symmetrical. Accessory muscles - No use  of accessory muscles in breathing. Palpation Palpation of the chest reveals - Non-tender. Auscultation Breath sounds - Normal and Clear.  Cardiovascular Auscultation Rhythm - Regular. Murmurs & Other Heart Sounds - Auscultation of the heart reveals - No Murmurs and No Systolic Clicks.  Abdomen Inspection Inspection of the abdomen reveals - No Visible peristalsis and No Abnormal pulsations. Umbilicus - No Bleeding, No Urine drainage. Palpation/Percussion Palpation and Percussion of the abdomen reveal - Soft, Non Tender, No Rebound tenderness, No Rigidity (guarding) and No Cutaneous hyperesthesia. Note: Abdomen with some asymmetry and some tenderness on the left side. Long transverse incision during flank consistent with his prior open left nephrectomy. Numerous subcutaneous case ellipsoid nodules consistent with numerous lipomas. 7 mm periumbilical hernia noticed with cough only. No obvious recurrent inguinal hernias or other abnormalities. Soft. Nontender. Not distended. No umbilical or incisional hernias. No guarding.  Male Genitourinary Sexual Maturity Tanner 5 - Adult hair pattern and Adult penile size and shape. Note: No recurrent hernias. Normal external male genitalia  Peripheral Vascular Upper Extremity Inspection - Left - No Cyanotic nailbeds - Left, Not Ischemic. Inspection - Right - No Cyanotic nailbeds - Right, Not Ischemic.  Neurologic Neurologic evaluation reveals -normal attention span and ability to concentrate, able to name objects and repeat phrases. Appropriate fund of knowledge , normal sensation and normal coordination. Mental Status Affect - not angry, not paranoid. Cranial Nerves-Normal Bilaterally. Gait-Normal.  Neuropsychiatric Mental status exam performed with findings of-able to articulate well with normal speech/language, rate, volume and coherence, thought content normal with ability to perform basic computations and apply abstract reasoning  and no evidence of hallucinations, delusions, obsessions or homicidal/suicidal ideation.  Musculoskeletal Global Assessment Spine, Ribs and Pelvis - no instability, subluxation or laxity. Right Upper Extremity - no instability, subluxation or laxity.  Lymphatic Head & Neck  General Head & Neck Lymphatics: Bilateral - Description - No Localized lymphadenopathy. Axillary  General Axillary Region: Bilateral - Description - No Localized lymphadenopathy. Femoral & Inguinal  Generalized Femoral & Inguinal Lymphatics: Left - Description - No Localized lymphadenopathy. Right - Description - No Localized lymphadenopathy.    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 Biopsy consistent with adenocarcinoma.  Standard of care is segmental colonic resection. Despite his history  of open Wilms tumor surgery, he is a reasonable candidate for a minimally invasive approach. Ideally robotic resection with intracorporeal anastomosis.  Enema to help delineate where exactly this lesion is. If it is more descending colon, then plan segmental resection of the splenic flexure. Need for splenectomy increased given prior Wilms tumor resection. If more proximal or mid transverse colon, then plan extended right hemicolectomy.  CT scan of chest for completion workup.  CEA blood all for completion workup.  Patient has a very enlarged spleen with some type of central lesion. 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 We'll hold off on trying to consider resection unless it is intimately involved with the tumor.  Patient has numerous subcutaneous masses consistent with lipomas. Not a major issue now. Probably very small periumbilical hernia. Would hold off on any intervention since is not seen to be a major issue at this  time.  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  Current Plans CARCINOEMBRYONIC ANTIGEN (CEA) (62947)  PREOP COLON - ENCOUNTER FOR PREOPERATIVE EXAMINATION FOR GENERAL SURGICAL PROCEDURE (Z01.818)  Current Plans You are being scheduled for surgery- Our schedulers will call you.  You should hear from our office's scheduling department within 5 working days about the location, date, and time of surgery. We try to make accommodations for patient's preferences in scheduling surgery, but sometimes the OR schedule or the surgeon's schedule prevents [place] from making those accommodations.  If you have not heard from our office [phone]) in 5 working days, call the office and ask for your surgeon's nurse.  If you have other questions about your diagnosis, plan, or surgery, call the office and ask for your surgeon's nurse.  Written instructions provided The anatomy & physiology of the digestive tract was discussed. The pathophysiology of the colon was discussed. Natural history risks without surgery was discussed. I feel the risks of no intervention will lead to serious problems that outweigh the operative risks; therefore, I recommended a partial colectomy to remove the pathology. Minimally invasive (Robotic/Laparoscopic) & open techniques were discussed.  Risks such as bleeding, infection, abscess, leak, reoperation, possible ostomy, hernia, heart attack, death, and other risks were discussed. I noted a good likelihood this will help address the problem. Goals of post-operative recovery were discussed as well. Need for adequate nutrition, daily bowel regimen and healthy physical activity, to optimize recovery was noted as well. We will work to minimize  complications. Educational materials were available as well. Questions were answered. The patient expresses understanding & wishes to proceed with surgery.  Pt Education - [facility] Colon Bowel Prep 2018 ERAS/Miralax/Antibiotics Started Neomycin Sulfate 500 MG Oral Tablet, 2 (two) Tablet SEE NOTE, #6, 01/11/2019, No Refill. Local Order: Pharmacist Notes: TAKE TWO TABLETS AT 2 PM, 3 PM, AND 10 PM THE DAY PRIOR TO SURGERY Started Flagyl 500 MG Oral Tablet, 2 (two) Tablet SEE NOTE, #6, 01/11/2019, No Refill. Local Order: Pharmacist Notes: Take at 2pm, 3pm, and 10pm the day prior to your colon operation Pt Education - Pamphlet Given - Laparoscopic Colorectal Surgery: discussed with patient and provided information. Pt Education - [facility] Colectomy post-op instructions: discussed with patient and provided information.  SPLENIC MASS (R16.1) Impression: Large splenic mass. Seen before partially on prior MRI lumbar  spine. Radiology favors hemangioma. They require splenectomy as well if highly suspicious. Seems unlikely. We will see.   INCISIONAL HERNIA, WITHOUT OBSTRUCTION OR GANGRENE (K43.2) Impression: Very small hernia at bellybutton most likely incisional from prior repair done. I would hold off on any aggressive intervention at this time. Perhaps primary repair at the time of surgery if needed   Lewis (D17.1) Impression: Numerous lipomas of anterior abdominal wall. No major changes as far as he can tell. Nothing particularly suspicious on CT scan. Hold off on removal since they're quite numerous.  [name], MD, FACS, MASCRS Gastrointestinal and Minimally Invasive Surgery    [address] [place], [place] [address] [phone] Main / Paging [phone] Fax

## 2019-01-12 ENCOUNTER — Other Ambulatory Visit: Payer: Self-pay | Admitting: Surgery

## 2019-01-12 ENCOUNTER — Telehealth: Payer: Self-pay | Admitting: *Deleted

## 2019-01-12 DIAGNOSIS — C184 Malignant neoplasm of transverse colon: Secondary | ICD-10-CM

## 2019-01-12 LAB — SURGICAL PATHOLOGY

## 2019-01-12 NOTE — Telephone Encounter (Signed)
   [facility] Pre-operative Risk Assessment    Request for surgical clearance:  1. What type of surgery is being performed? ROBOTIC PARTIAL COLECTOMY w/POSSIBLE SPLENECTOMY   2. When is this surgery scheduled? TBD   3. What type of clearance is required (medical clearance vs. Pharmacy clearance to hold med vs. Both)? MEDICAL  4. Are there any medications that need to be held prior to surgery and how long? ASA    5. Practice name and name of physician performing surgery? [facility]; DR. Remo Lipps GROSS   6. What is your office phone number [phone]    7.   What is your office fax number [phone]  8.   Anesthesia type (None, local, MAC, general) ? GENERAL    Julaine Hua 01/12/2019, 10:11 AM  _________________________________________________________________   (provider comments below)

## 2019-01-12 NOTE — Telephone Encounter (Signed)
   [place], MD  Chart reviewed as part of pre-operative protocol coverage. Because of [name]'s past medical history and time since last visit, he/she will require a follow-up visit in order to better assess preoperative cardiovascular risk.  Pre-op covering staff: - Please schedule appointment and call patient to inform them. - Please contact requesting surgeon's office via preferred method (i.e, phone, fax) to inform them of need for appointment prior to surgery.     [place], [place]  01/12/2019, 1:39 PM

## 2019-01-12 NOTE — Telephone Encounter (Signed)
Called Mr. Strosnider and got him scheduled for a pre op clearance appointment with Roby Lofts, PA-C on 11/04 at [facility].

## 2019-01-17 NOTE — Progress Notes (Addendum)
Cardiology Office Note:    Date:  01/18/2019   ID:  [name], David 01/02/1966, MRN [id number]  PCP:  Orpah Melter, MD  Cardiologist:  Larae Grooms, MD  Electrophysiologist:  None   Referring MD: Orpah Melter, MD   Chief Complaint  Patient presents with  . Surgical Clearance    History of Present Illness:    [name] is a 53 y.o. 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 Irish Lack in October 2019.  He was recently diagnosed with colon cancer based upon surveillance colonoscopy.  He is in need of robotic partial colectomy and possible splenectomy with Dr. Johney Maine.  The patient remains very active.  He works out 3 to 4 days a week.  He does cardiovascular exercise as well as weight lifting.  He also races a car.  He has significant elevated heart rates during car races.  He has no symptoms of chest discomfort, shortness of breath.  He has not had orthopnea, syncope or lower extremity swelling.  Prior CV studies:   The following studies were reviewed today:  Echocardiogram 10/31/2016 EF 55, no RWMA, Gr 2 DD, mild to mod MR, PASP 25  Echocardiogram 01/04/15 EF 45-50, asc aorta 3.7 cm, mild ant and post MVP, mod MR, trivial TR, mild PI  Carotid US 03/05/14 Summary:  Findings suggest 1-39% internal carotid artery stenosis  bilaterally. Vertebral arteries are patent with antegrade flow.     Past Medical History:  Diagnosis Date  . Allergic rhinitis   . Asthma    animal dander & seasonal asthma  . Diabetes mellitus without complication ([place])    dx 2010  . Discitis of lumbosacral region    first started with this ..and rolled onto the endocarditis    stayed a  month  . 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 H/O scoliosis   . History of hiatal hernia   . Hyperlipidemia   . Hypertension    Diagnostic exercise tolerance test assessment:04/30/2010 : comments normal -no evidence os ischemia by ST analysis  . lipoma   . PONV (postoperative nausea and vomiting)   . Solitary kidney   . 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   Current Medications: Current Meds  Medication Sig  . albuterol (PROVENTIL HFA;VENTOLIN HFA) 108 (90 BASE) MCG/ACT inhaler Inhale 1 puff into the lungs every 6 (six) hours as needed for wheezing or shortness of breath.  Marland Kitchen aspirin EC 81 MG tablet Take 81 mg by mouth every evening.   Marland Kitchen atorvastatin (LIPITOR) 10 MG tablet Take 1 tablet (10 mg total) by mouth daily.  . calcium carbonate (TUMS - DOSED IN MG ELEMENTAL CALCIUM) 500 MG chewable tablet Chew 1 tablet by mouth daily as needed for indigestion or heartburn.  Marland Kitchen lisinopril (PRINIVIL,ZESTRIL) 2.5 MG tablet Take 1 tablet (2.5 mg total) by mouth  daily.  . metFORMIN (GLUCOPHAGE) 1000 MG tablet Take 1,000 mg by mouth 2 (two) times daily.   . mometasone (ASMANEX) 220 MCG/INH inhaler Inhale 1 puff into the lungs daily.  . Multiple Vitamin (MULTIVITAMIN WITH MINERALS) TABS tablet Take 1 tablet by mouth daily.   . Omega-3 Fatty Acids (FISH OIL) 1000 MG CAPS Take 1,000 mg by mouth daily.   . ONE TOUCH ULTRA TEST test strip 1 each by Other route as needed (GLUCOSE).   Marland Kitchen repaglinide (PRANDIN) 1 MG tablet Take 1 mg by mouth 2 (two) times daily before a meal.   . TOPROL XL 25 MG 24 hr tablet Take 25 mg by mouth daily.      Allergies:    No known allergies and Other   Social History   Tobacco Use  . Smoking status: Never Smoker  . Smokeless tobacco: Never Used  Substance Use Topics  . Alcohol use: No  . Drug use: No     Family Hx: The patient's family history includes Cancer in his mother; Healthy in his sister; Heart attack in his maternal grandfather and paternal grandfather; Heart disease in his father; Hernia in his father; Hypotension in his mother.  ROS:   Please see the history of present illness.    ROS All other systems reviewed and are negative.   EKGs/Labs/Other Test Reviewed:    EKG:  EKG is  ordered today.  The ekg ordered today demonstrates normal sinus rhythm, heart rate 77, normal axis, no ST-T wave changes, QTC 441  Recent Labs: 01/08/2019: ALT 23; BUN 12; Creatinine, Ser 1.06; Hemoglobin 15.8; Platelets 148; Potassium 3.9; Sodium 137   Recent Lipid Panel Lab Results  Component Value Date/Time   CHOL 116 03/05/2014 06:30 AM   TRIG 122 03/05/2014 06:30 AM   HDL 21 (L) 03/05/2014 06:30 AM   CHOLHDL 5.5 03/05/2014 06:30 AM   LDLCALC 71 03/05/2014 06:30 AM    Physical Exam:    VS:  BP (!) 150/90   Pulse 72   Ht 6' (1.829 m)   Wt 209 lb 12.8 oz (95.2 kg)   SpO2 98%   BMI 28.45 kg/m     Wt Readings from Last 3 Encounters:  01/18/19 209 lb 12.8 oz (95.2 kg)  01/08/19 205 lb (93 kg)  01/07/19 205 lb (93 kg)     Physical Exam  Constitutional: He is oriented to person, place, and time. He appears well-developed and well-nourished. No distress.  HENT:  Head: Normocephalic and atraumatic.  Eyes: No scleral icterus.  Neck: No JVD present. No thyromegaly present.  Cardiovascular: Normal rate and regular rhythm.  Murmur heard.  Holosystolic murmur is present with a grade of 3/6 at the lower left sternal border radiating to the apex. Pulmonary/Chest: Effort normal and breath sounds normal. He has no rales.  Abdominal: Soft. There is no hepatomegaly.  Musculoskeletal:        General:  No edema.  Lymphadenopathy:    He has no cervical adenopathy.  Neurological: He is alert and oriented to person, place, and time.  Skin: Skin is warm and dry.  Psychiatric: He has a normal mood and affect.    ASSESSMENT & PLAN:    1. Preoperative cardiovascular examination He does not have a history of coronary artery disease or significant risk factors for perioperative CV complications.  However, his upcoming surgery may involve splenectomy.  Therefore, his risk of perioperative major cardiac event is 0.9% according to the Revised Risk Cardiac Index.  Despite this, he is still at low risk.  He is able to achieve much more than 4 METs without difficulty.  He does have a history of mitral regurgitation.  He has not had an echocardiogram since 2018.  He does not require stress testing prior to proceeding with his noncardiac surgery.  However, he does need a follow-up echocardiogram.  This will be arranged soon as possible (later this week).  I will send an updated copy of my note to his surgeon once the echocardiogram is completed.  His appointment with Dr. Irish Lack in December will be kept in place.  If his echocardiogram is stable, we will change his December appointment to 6 months from now.  2. Non-rheumatic mitral regurgitation History of bacterial endocarditis.  He has resultant mitral regurgitation.  As noted, repeat echocardiogram will be obtained.  Continue SBE prophylaxis.  3. Type 2 diabetes mellitus without complication, without long-term current use of insulin ([place]) Continue follow-up with primary care.  4. Essential hypertension Blood pressure above target today.  Continue to monitor.   Dispo:  Return in 7 weeks (on 03/10/2019) for Scheduled Follow Up.   Medication Adjustments/Labs and Tests Ordered: Current medicines are reviewed at length with the patient today.  Concerns regarding medicines are outlined above.  Tests Ordered: Orders Placed This Encounter  Procedures  .  EKG 12-Lead  . ECHOCARDIOGRAM COMPLETE   Medication Changes: No orders of the defined types were placed in this encounter.   Signed, Richardson Dopp, PA-C  01/18/2019 3:50 PM    [facility] [place], [place], [place]  [address] Phone: [phone]; Fax: [phone]   Addendum 01/21/2019 1:06 PM  Echocardiogram 01/20/2019:  EF 60-65, myxomatous mitral valve, severe mitral regurgitation, partial flail leaflet of posterior mitral valve, myxomatous tricuspid valve with trivial TR, ascending aorta mildly dilated (39 mm)  The patient's echocardiogram now demonstrates severe mitral regurgitation.  I reviewed this with Dr. Irish Lack who contacted the patient by telephone.  His mitral valve disease does not prevent him from proceeding with his colon surgery.  He will eventually need mitral valve repair at some point in the near future.  He may proceed with his colon surgery at acceptable risk.  He should have SBE prophylaxis perioperatively.  PLAN:  1.  Proceed with colon surgery at acceptable cardiovascular risk. 2.  Use antibiotics for SBE prophylaxis perioperatively. Richardson Dopp, PA-C    01/21/2019 1:08 PM

## 2019-01-17 NOTE — Telephone Encounter (Signed)
Patient's appt has already been rescheduled for tomorrow with Richardson Dopp, PA. Patient denies any other concerns at this time.

## 2019-01-18 ENCOUNTER — Ambulatory Visit: Payer: BC Managed Care – PPO | Admitting: Physician Assistant

## 2019-01-18 ENCOUNTER — Other Ambulatory Visit: Payer: Self-pay

## 2019-01-18 ENCOUNTER — Other Ambulatory Visit: Payer: BC Managed Care – PPO

## 2019-01-18 ENCOUNTER — Encounter: Payer: Self-pay | Admitting: Physician Assistant

## 2019-01-18 VITALS — BP 150/90 | HR 72 | Ht 72.0 in | Wt 209.8 lb

## 2019-01-18 DIAGNOSIS — Z01810 Encounter for preprocedural cardiovascular examination: Secondary | ICD-10-CM | POA: Diagnosis not present

## 2019-01-18 DIAGNOSIS — I10 Essential (primary) hypertension: Secondary | ICD-10-CM | POA: Diagnosis not present

## 2019-01-18 DIAGNOSIS — E119 Type 2 diabetes mellitus without complications: Secondary | ICD-10-CM

## 2019-01-18 DIAGNOSIS — I340 Nonrheumatic mitral (valve) insufficiency: Secondary | ICD-10-CM | POA: Diagnosis not present

## 2019-01-18 NOTE — Patient Instructions (Signed)
Medication Instructions:  Your physician recommends that you continue on your current medications as directed. Please refer to the Current Medication list given to you today.  *If you need a refill on your cardiac medications before your next appointment, please call your pharmacy*  Lab Work: None ordered  If you have labs (blood work) drawn today and your tests are completely normal, you will receive your results only by: Marland Kitchen MyChart Message (if you have MyChart) OR . A paper copy in the mail If you have any lab test that is abnormal or we need to change your treatment, we will call you to review the results.  Testing/Procedures: Your physician has requested that you have an echocardiogram. 01/20/2019 ARRIVE AT 7:20 FOR REGISTRATION FOR THIS STUDY.  Echocardiography is a painless test that uses sound waves to create images of your heart. It provides your doctor with information about the size and shape of your heart and how well your heart's chambers and valves are working. This procedure takes approximately one hour. There are no restrictions for this procedure.    Follow-Up: At [facility], you and your health needs are our priority.  As part of our continuing mission to provide you with exceptional heart care, we have created designated Provider Care Teams.  These Care Teams include your primary Cardiologist (physician) and Advanced Practice Providers (APPs -  Physician Assistants and Nurse Practitioners) who all work together to provide you with the care you need, when you need it.  Your next appointment:   Keep your appointment with Dr. Irish Lack as planned in December  Other Instructions  Echocardiogram An echocardiogram is a procedure that uses painless sound waves (ultrasound) to produce an image of the heart. Images from an echocardiogram can provide important information about:  Signs of coronary artery disease (CAD).  Aneurysm detection. An aneurysm is a weak or damaged  part of an artery wall that bulges out from the normal force of blood pumping through the body.  Heart size and shape. Changes in the size or shape of the heart can be associated with certain conditions, including heart failure, aneurysm, and CAD.  Heart muscle function.  Heart valve function.  Signs of a past heart attack.  Fluid buildup around the heart.  Thickening of the heart muscle.  A tumor or infectious growth around the heart valves. Tell a health care provider about:  Any allergies you have.  All medicines you are taking, including vitamins, herbs, eye drops, creams, and over-the-counter medicines.  Any blood disorders you have.  Any surgeries you have had.  Any medical conditions you have.  Whether you are pregnant or may be pregnant. What are the risks? Generally, this is a safe procedure. However, problems may occur, including:  Allergic reaction to dye (contrast) that may be used during the procedure. What happens before the procedure? No specific preparation is needed. You may eat and drink normally. What happens during the procedure?   An IV tube may be inserted into one of your veins.  You may receive contrast through this tube. A contrast is an injection that improves the quality of the pictures from your heart.  A gel will be applied to your chest.  A wand-like tool (transducer) will be moved over your chest. The gel will help to transmit the sound waves from the transducer.  The sound waves will harmlessly bounce off of your heart to allow the heart images to be captured in real-time motion. The images will be recorded  on a computer. The procedure may vary among health care providers and hospitals. What happens after the procedure?  You may return to your normal, everyday life, including diet, activities, and medicines, unless your health care provider tells you not to do that. Summary  An echocardiogram is a procedure that uses painless sound  waves (ultrasound) to produce an image of the heart.  Images from an echocardiogram can provide important information about the size and shape of your heart, heart muscle function, heart valve function, and fluid buildup around your heart.  You do not need to do anything to prepare before this procedure. You may eat and drink normally.  After the echocardiogram is completed, you may return to your normal, everyday life, unless your health care provider tells you not to do that. This information is not intended to replace advice given to you by your health care provider. Make sure you discuss any questions you have with your health care provider. Document Released: 03/14/2000 Document Revised: 07/08/2018 Document Reviewed: 04/19/2016 Elsevier Patient Education  2020 [employer].

## 2019-01-20 ENCOUNTER — Ambulatory Visit (HOSPITAL_COMMUNITY): Payer: BC Managed Care – PPO | Attending: Cardiology

## 2019-01-20 ENCOUNTER — Other Ambulatory Visit: Payer: Self-pay

## 2019-01-20 ENCOUNTER — Ambulatory Visit
Admission: RE | Admit: 2019-01-20 | Discharge: 2019-01-20 | Disposition: A | Discharge status: Home / Self Care | Payer: BC Managed Care – PPO | Source: Ambulatory Visit | Attending: Surgery | Admitting: Surgery

## 2019-01-20 ENCOUNTER — Telehealth: Payer: Self-pay | Admitting: Genetic Counselor

## 2019-01-20 DIAGNOSIS — I340 Nonrheumatic mitral (valve) insufficiency: Secondary | ICD-10-CM | POA: Insufficient documentation

## 2019-01-20 DIAGNOSIS — Z01810 Encounter for preprocedural cardiovascular examination: Secondary | ICD-10-CM | POA: Diagnosis not present

## 2019-01-20 DIAGNOSIS — C19 Malignant neoplasm of rectosigmoid junction: Secondary | ICD-10-CM | POA: Diagnosis not present

## 2019-01-20 DIAGNOSIS — C184 Malignant neoplasm of transverse colon: Secondary | ICD-10-CM

## 2019-01-20 MED ORDER — IOPAMIDOL (ISOVUE-300) INJECTION 61%
75.0000 mL | Freq: Once | INTRAVENOUS | Status: AC | PRN
  Administered 2019-01-20: 75 mL via INTRAVENOUS

## 2019-01-20 NOTE — Telephone Encounter (Signed)
A genetic counseling appt has been scheduled for Mr [name] to see [name] on 10/28 at 2pm. Pt aware to arrive 15 minutes early.

## 2019-01-21 ENCOUNTER — Encounter: Payer: Self-pay | Admitting: Physician Assistant

## 2019-01-24 ENCOUNTER — Ambulatory Visit
Admission: RE | Admit: 2019-01-24 | Discharge: 2019-01-24 | Disposition: A | Discharge status: Home / Self Care | Payer: BC Managed Care – PPO | Source: Ambulatory Visit | Attending: Surgery | Admitting: Surgery

## 2019-01-24 DIAGNOSIS — C184 Malignant neoplasm of transverse colon: Secondary | ICD-10-CM

## 2019-01-24 DIAGNOSIS — K6389 Other specified diseases of intestine: Secondary | ICD-10-CM | POA: Diagnosis not present

## 2019-01-24 DIAGNOSIS — Z01818 Encounter for other preprocedural examination: Secondary | ICD-10-CM | POA: Diagnosis not present

## 2019-01-25 ENCOUNTER — Telehealth: Payer: Self-pay | Admitting: Genetic Counselor

## 2019-01-25 NOTE — Telephone Encounter (Signed)
Called patient regarding upcoming [facility] appointment, left a voicemail. Walk-in visit.

## 2019-01-26 ENCOUNTER — Inpatient Hospital Stay: Payer: BC Managed Care – PPO | Attending: Genetic Counselor | Admitting: Genetic Counselor

## 2019-01-26 ENCOUNTER — Encounter: Payer: Self-pay | Admitting: Genetic Counselor

## 2019-01-26 ENCOUNTER — Other Ambulatory Visit: Payer: Self-pay

## 2019-01-26 ENCOUNTER — Inpatient Hospital Stay: Payer: BC Managed Care – PPO

## 2019-01-26 ENCOUNTER — Other Ambulatory Visit: Payer: Self-pay | Admitting: Genetic Counselor

## 2019-01-26 DIAGNOSIS — Z808 Family history of malignant neoplasm of other organs or systems: Secondary | ICD-10-CM | POA: Insufficient documentation

## 2019-01-26 DIAGNOSIS — Z85850 Personal history of malignant neoplasm of thyroid: Secondary | ICD-10-CM

## 2019-01-26 DIAGNOSIS — Z905 Acquired absence of kidney: Secondary | ICD-10-CM

## 2019-01-26 DIAGNOSIS — D179 Benign lipomatous neoplasm, unspecified: Secondary | ICD-10-CM

## 2019-01-26 DIAGNOSIS — Z800 Family history of malignant neoplasm of digestive organs: Secondary | ICD-10-CM | POA: Insufficient documentation

## 2019-01-26 DIAGNOSIS — D1803 Hemangioma of intra-abdominal structures: Secondary | ICD-10-CM

## 2019-01-26 DIAGNOSIS — C182 Malignant neoplasm of ascending colon: Secondary | ICD-10-CM

## 2019-01-26 DIAGNOSIS — C189 Malignant neoplasm of colon, unspecified: Secondary | ICD-10-CM

## 2019-01-26 DIAGNOSIS — C186 Malignant neoplasm of descending colon: Secondary | ICD-10-CM | POA: Insufficient documentation

## 2019-01-26 HISTORY — DX: Hemangioma of intra-abdominal structures: D18.03

## 2019-01-26 HISTORY — DX: Benign lipomatous neoplasm, unspecified: D17.9

## 2019-01-26 HISTORY — DX: Malignant neoplasm of colon, unspecified: C18.9

## 2019-01-26 NOTE — Progress Notes (Signed)
REFERRING PROVIDER: Michael Boston, MD [address] [place],  [place] [address]  PRIMARY PROVIDER:  Orpah Melter, MD  PRIMARY REASON FOR VISIT:  1. Solitary kidney, acquired   2. Family history of colon cancer   3. Family history of thyroid cancer   4. Malignant neoplasm of ascending colon ([place])   5. History of thyroid cancer   6. Lipoma, unspecified site   7. Hemangioma of spleen      HISTORY OF PRESENT ILLNESS:   Mr. Dehaas, a 53 y.o. male, was seen for a [place] cancer genetics consultation at the request of Dr. Johney Maine due to a personal and family history of cancer.  Mr. Stefanski presents to clinic today to discuss the possibility of a hereditary predisposition to cancer, genetic testing, and to further clarify his future cancer risks, as well as potential cancer risks for family members.   In October 2020, at the age of 32, Mr. Sallade was diagnosed with cancer of the ascending colon. The treatment plan includes surgery. MSI/IHC was normal.  He has a history of wilms tumor at age 109-74 years of age that was treated with chemotherapy and radiation.  He reports having asymmetry of his body after that treatment, but not prior to it.  No reported history of enlarged tongue that would suggest Beckwith Wiedemann syndrome. Starting around age 58 he developed multiple lipomas.  These have been removed when they get too large or are painful.  He reports that they have been biopsied and are called lipomas and not leiomyomas.  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  Lastly, he reports that there are spots on his lungs that were identified on a recent CT scan that will be followed.     CANCER HISTORY:  Oncology History   No history exists.      Past Medical History:  Diagnosis Date   Allergic rhinitis    Asthma    animal dander & seasonal asthma   Colon cancer ([place] [place]) 01/26/2019   Diabetes  mellitus without complication ([place])    dx 2010   Discitis of lumbosacral region    first started with this ..and rolled onto the endocarditis    stayed a month   Endocarditis of mitral valve    11/2013 from back strep infection   Family history of colon cancer    Family history of thyroid cancer    H/O scoliosis    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   History of hiatal hernia    Hyperlipidemia    Hypertension    Diagnostic exercise tolerance test assessment:04/30/2010 : comments normal -no evidence os ischemia by ST analysis   lipoma    Lipoma 01/26/2019   Mitral regurgitation    Echo 12/2018: EF 60-65, myxomatous mitral valve, severe mitral regurgitation, partial flail leaflet of posterior mitral valve, myxomatous tricuspid valve with trivial TR, ascending aorta mildly dilated (39 mm)   PONV (postoperative nausea and vomiting)    Solitary kidney    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    Past Surgical History:  Procedure Laterality Date   BIOPSY  01/07/2019   Procedure: BIOPSY;  Surgeon: Wilford Corner, MD;  Location: [facility];  Service: Endoscopy;;   COLONOSCOPY WITH PROPOFOL N/A 01/07/2019   Procedure: COLONOSCOPY WITH PROPOFOL;  Surgeon: Wilford Corner, MD;  Location: [facility];  Service: Endoscopy;  Laterality:  N/A;   INGUINAL HERNIA REPAIR Left 04/23/2016   Procedure: LAPAROSCOPIC  INGUINAL REPAIR;  Surgeon: Clovis Riley, MD;  Location: [facility];  Service: General;  Laterality: Left;   [place] INJECTION  01/07/2019   Procedure: SUBMUCOSAL INJECTION;  Surgeon: Wilford Corner, MD;  Location: [facility];  Service: Endoscopy;;   THYROID SURGERY     TONSILLECTOMY     TOTAL NEPHRECTOMY Left     Social History   Socioeconomic History   Marital status: Married    Spouse name: Not on file   Number of children: Not on file   Years of education: Not on file   Highest  education level: Not on file  Occupational History   Occupation: executive  Social Needs   Financial resource strain: Not on file   Food insecurity    Worry: Not on file    Inability: Not on file   Transportation needs    Medical: Not on file    Non-medical: Not on file  Tobacco Use   Smoking status: Never Smoker   Smokeless tobacco: Never Used  Substance and Sexual Activity   Alcohol use: No   Drug use: No   Sexual activity: Not on file  Lifestyle   Physical activity    Days per week: Not on file    Minutes per session: Not on file   Stress: Not on file  Relationships   Social connections    Talks on phone: Not on file    Gets together: Not on file    Attends religious service: Not on file    Active member of club or organization: Not on file    Attends meetings of clubs or organizations: Not on file    Relationship status: Not on file  Other Topics Concern   Not on file  Social History Narrative   Never smoked    Alcohol yes, rare ,1-2 per week   No recreational drugs   Occupation-  Geneticist, molecular and other executive courses   Marital status - married     Children 1 boy           FAMILY HISTORY:  We obtained a detailed, 4-generation family history.  Significant diagnoses are listed below: Family History  Problem Relation Age of Onset   Heart disease Father    Hernia Father    Healthy Sister    Stroke Paternal Grandmother    Cancer Mother        THYROID   Hypotension Mother    Heart attack Maternal Grandfather    Congestive Heart Failure Maternal Grandfather    Heart attack Paternal Grandfather    Colon cancer Maternal Aunt 79   Congestive Heart Failure Paternal Uncle    Congestive Heart Failure Maternal Grandmother    Colon cancer Other 32       MGMs brother   Colon cancer Other        MGFs brother    The patient has one son who is cancer free.  He has one sister who is also cancer free.  Both parents are  living.    The patient's mother was diagnosed with thyroid cancer at age 44.  She had one brother and two sisters. One sister has colon cancer diagnosed around age 33.  The maternal grandparents are deceased.  The grandmother had a brother with colon cancer diagnosed around age 36-86.  The patient's  father is cancer free.  He had a sister and two brothers.  None had cancer.  The paternal grandparents are deceased from non cancer related issues.  The grandfather had a brother with colon cancer.  Mr. Wanke is unaware of previous family history of genetic testing for hereditary cancer risks. Patient's maternal ancestors are of [place], [place], [place], [place], [place] descent, and paternal ancestors are of [place] descent. There is no reported Ashkenazi Jewish ancestry. There is no known consanguinity.  GENETIC COUNSELING ASSESSMENT: Mr. Paino is a 53 y.o. male with a personal and family history of cancer which is somewhat suggestive of a hereditary cancer syndrome and predisposition to cancer given his young age of onset of cancer. We, therefore, discussed and recommended the following at today's visit.   DISCUSSION: We discussed that 5 - 10% of cancer is hereditary.  When determining if a cancer is hereditary we review several things including age of onset, related cancers in the family and or individual, and multiple generations of cancer.  The patient has multiple cancers diagnosed within himself as well as his family.  It is unclear if they are related due to a hereditary syndrome, or possibly if the multiple cancers/tumors in him are the result of radiation field from treatment of wilms tumor at age 47-3.  We discussed that the combination of cancer and other findings are consistent with a differential including Cowden syndrome (PTEN), HLRCC due to Leroy mutations, as well as Lynch syndrome.   PTEN mutations cause Cowden syndrome, which can increase the risk for breast, uterine, thyroid,  colon and kidney cancer, as well as have other tumors or malformations including lipomas and vascular malformations including hemangiomas.  The type of kidney cancer in PTEN mutations is typically papillary, and the thyroid cancer is generally follicular type, and therefore not consistent with the type of kidney and thyroid cancer found in the patient.    HLRCC is typically associated with leiomyomas (cutaneous as well as uterine) and renal cell carcinoma.  We discussed that Wilms tumor is not the typical kidney cancer found with HLRCC, and he has lipomas rather than leiomyomas, but this still is within our differential diagnosis.  Lastly, colon cancer at 58 with a history of kidney cancer and family history of colon cancer, raises the suspicion of Lynch syndrome.  MMR and IHC can screen for Lynch syndrome by using these in tumor testing.  Mr. Abdallah' tumor testing was normal, suggesting that his colon cancer is not due to an MMR mutation.  However, we discussed, that there is about a 10% chance that there is a Lynch syndrome mutation even with a normal IHC.    Lastly, there could be a mutation within a wilms tumor gene that caused his original diagnosis, and that other cancers and tumors were a result of being within a radiation field.  We discussed that testing is beneficial for several reasons including knowing how to follow individuals after completing their treatment, identifying whether potential treatment options such as systemic therapy would be beneficial, and understand if other family members could be at risk for cancer and allow them to undergo genetic testing.   We reviewed the characteristics, features and inheritance patterns of hereditary cancer syndromes. We also discussed genetic testing, including the appropriate family members to test, the process of testing, insurance coverage and turn-around-time for results. We discussed the implications of a negative, positive, carrier and/or variant  of uncertain significant result. We recommended Mr. Gilberg pursue genetic  testing for the multi cancer gene panel and the wilms tumor/thyroid cancer gene panel with preliminary genes.  Based on Mr. Bartoli personal and family history of cancer, he meets medical criteria for genetic testing. Despite that he meets criteria, he may still have an out of pocket cost. We discussed that if his out of pocket cost for testing is over $100, the laboratory will call and confirm whether he wants to proceed with testing.  If the out of pocket cost of testing is less than $100 he will be billed by the genetic testing laboratory.   In order to estimate his chance of having a PTEN mutation, we used statistical models ([facility] Score Calculator) that consider his personal medical history, family history and ancestry.  Because each model is different, there can be a lot of variability in the risks they give.  Therefore, these numbers must be considered a rough range and not a precise risk of having a PTEN mutation.  These models estimate that he has approximately a 2.8-7.1% chance of having a mutation. Based on this assessment of his family and personal history, genetic testing is recommended.    PLAN: After considering the risks, benefits, and limitations, Mr. Haag provided informed consent to pursue genetic testing and the blood sample was sent to [facility] for analysis of the Multi-cancer, wilms tumor and thyroid cancer panel. Results should be available within approximately 2-3 weeks' time, at which point they will be disclosed by telephone to Mr. Reister, as will any additional recommendations warranted by these results. Mr. Carton will receive a summary of his genetic counseling visit and a copy of his results once available. This information will also be available in Epic.   Lastly, we encouraged Mr. Rosato to remain in contact with cancer genetics annually so that we can continuously update the  family history and inform him of any changes in cancer genetics and testing that may be of benefit for this family.   Mr. Mahrt questions were answered to his satisfaction today. Our contact information was provided should additional questions or concerns arise. Thank you for the referral and allowing [place] to share in the care of your patient.   Murry Diaz P. Florene Glen, [place], [facility] Licensed, Insurance risk surveyor [email or web address] phone: [phone]  The patient was seen for a total of 50 minutes in face-to-face genetic counseling.  This patient was discussed with Drs. Magrinat, Lindi Adie and/or Burr Medico who agrees with the above.    _______________________________________________________________________ For Office Staff:  Number of people involved in session: 1 Was an Intern/ student involved with case: yes: Delorise Royals

## 2019-02-02 ENCOUNTER — Ambulatory Visit: Payer: BC Managed Care – PPO | Admitting: Medical

## 2019-02-08 ENCOUNTER — Ambulatory Visit: Payer: Self-pay | Admitting: Genetic Counselor

## 2019-02-08 ENCOUNTER — Telehealth: Payer: Self-pay | Admitting: Genetic Counselor

## 2019-02-08 ENCOUNTER — Encounter: Payer: Self-pay | Admitting: Genetic Counselor

## 2019-02-08 DIAGNOSIS — Z5181 Encounter for therapeutic drug level monitoring: Secondary | ICD-10-CM | POA: Insufficient documentation

## 2019-02-08 DIAGNOSIS — Z1379 Encounter for other screening for genetic and chromosomal anomalies: Secondary | ICD-10-CM | POA: Insufficient documentation

## 2019-02-08 NOTE — Progress Notes (Signed)
HPI:  [name] was previously seen in the [place] clinic due to a personal and family history of cancer and concerns regarding a hereditary predisposition to cancer. Please refer to our prior cancer genetics clinic note for more information regarding our discussion, assessment and recommendations, at the time. [name] recent genetic test results were disclosed to him, as were recommendations warranted by these results. These results and recommendations are discussed in more detail below.  CANCER HISTORY:  Oncology History  Colon cancer ([place])  01/26/2019 Initial Diagnosis   Colon cancer ([place])   02/08/2019 Genetic Testing   POLE VUS identified, but otherwise negative genetic testing on a multi-cancer/multi-gene panel.  The Multi-Gene Panel offered by [facility] includes sequencing and/or deletion duplication testing of the following 87 genes: AIP, ALK, APC, ATM, AXIN2,BAP1,  BARD1, BLM, BMPR1A, BRCA1, BRCA2, BRIP1, CASR, CDC73, CDH1, CDK4, CDKN1B, CDKN1C, CDKN2A (p14ARF), CDKN2A (p16INK4a), CEBPA, CHEK2, CTNNA1, CTR9, DICER1, DIS3L2, EGFR (c.2369C>T, p.Thr790Met variant only), EPCAM (Deletion/duplication testing only), FH, FLCN, GATA2, GPC3, GREM1 (Promoter region deletion/duplication testing only), HOXB13 (c.251G>A, p.Gly84Glu), HRAS, KIT, MAX, MEN1, MET, MITF (c.952G>A, p.Glu318Lys variant only), MLH1, MSH2, MSH3, MSH6, MUTYH, NBN, NF1, NF2, NTHL1, PALB2, PDGFRA, PHOX2B, PMS2, POLD1, POLE, POT1, PRKAR1A, PTCH1, PTEN, RAD50, RAD51C, RAD51D, RB1, RECQL4, REST, RET, RNF43, RUNX1, SDHAF2, SDHA (sequence changes only), SDHB, SDHC, SDHD, SMAD4, SMARCA4, SMARCB1, SMARCE1, STK11, SUFU, TERC, TERT, TMEM127, TP53, TSC1, TSC2, VHL, WRN and WT1.  The report date is 02/08/2019.     FAMILY HISTORY:  We obtained a detailed, 4-generation family history.  Significant diagnoses are listed below: Family History  Problem Relation Age of Onset   Heart disease Father    Hernia Father    Healthy  Sister    Stroke Paternal Grandmother    Cancer Mother        THYROID   Hypotension Mother    Heart attack Maternal Grandfather    Congestive Heart Failure Maternal Grandfather    Heart attack Paternal Grandfather    Colon cancer Maternal Aunt 79   Congestive Heart Failure Paternal Uncle    Congestive Heart Failure Maternal Grandmother    Colon cancer Other 22       MGMs brother   Colon cancer Other        MGFs brother    The patient has one son who is cancer free.  He has one sister who is also cancer free.  Both parents are living.    The patient's mother was diagnosed with thyroid cancer at age 69.  She had one brother and two sisters. One sister has colon cancer diagnosed around age 47.  The maternal grandparents are deceased.  The grandmother had a brother with colon cancer diagnosed around age 89-86.  The patient's father is cancer free.  He had a sister and two brothers.  None had cancer.  The paternal grandparents are deceased from non cancer related issues.  The grandfather had a brother with colon cancer.  [name] is unaware of previous family history of genetic testing for hereditary cancer risks. Patient's maternal ancestors are of [place], [place], [place], [place], [place] descent, and paternal ancestors are of [place] descent. There is no reported Ashkenazi Jewish ancestry. There is no known consanguinity.    GENETIC TEST RESULTS: Genetic testing reported out on February 09, 2019 through the Multi-gene cancer panel found no pathogenic mutations. The Multi-Gene Panel offered by [facility] includes sequencing and/or deletion duplication testing of the following 85 genes: AIP,  ALK, APC, ATM, AXIN2,BAP1,  BARD1, BLM, BMPR1A, BRCA1, BRCA2, BRIP1, CASR, CDC73, CDH1, CDK4, CDKN1B, CDKN1C, CDKN2A (p14ARF), CDKN2A (p16INK4a), CEBPA, CHEK2, CT89, CTNNA1, DICER1, DIS3L2, EGFR (c.2369C>T, p.Thr790Met variant only), EPCAM (Deletion/duplication testing only), FH,  FLCN, GATA2, GPC3, GREM1 (Promoter region deletion/duplication testing only), HOXB13 (c.251G>A, p.Gly84Glu), HRAS, KIT, MAX, MEN1, MET, MITF (c.952G>A, p.Glu318Lys variant only), MLH1, MSH2, MSH3, MSH6, MUTYH, NBN, NF1, NF2, NTHL1, PALB2, PDGFRA, PHOX2B, PMS2, POLD1, POLE, POT1, PRKAR1A, PTCH1, PTEN, RAD50, RAD51C, RAD51D, RB1, RECQL4, REST, RET, RNF43, RUNX1, SDHAF2, SDHA (sequence changes only), SDHB, SDHC, SDHD, SMAD4, SMARCA4, SMARCB1, SMARCE1, STK11, SUFU, TERC, TERT, TMEM127, TP53, TSC1, TSC2, VHL, WRN and WT1. The test report has been scanned into EPIC and is located under the Molecular Pathology section of the Results Review tab.  A portion of the result report is included below for reference.     We discussed with [name] that because current genetic testing is not perfect, it is possible there may be a gene mutation in one of these genes that current testing cannot detect, but that chance is small.  We also discussed, that there could be another gene that has not yet been discovered, or that we have not yet tested, that is responsible for the cancer diagnoses in the family. It is also possible there is a hereditary cause for the cancer in the family that [name] did not inherit and therefore was not identified in his testing.  Therefore, it is important to remain in touch with cancer genetics in the future so that we can continue to offer [name] the most up to date genetic testing.   Genetic testing did identify a variant of uncertain significance (VUS) was identified in the POLE gene called c3582+5C>T (Intronic)..  At this time, it is unknown if this variant is associated with increased cancer risk or if this is a normal finding, but most variants such as this get reclassified to being inconsequential. It should not be used to make medical management decisions. With time, we suspect the lab will determine the significance of this variant, if any. If we do learn more about it, we will try to  contact [name] to discuss it further. However, it is important to stay in touch with [place] periodically and keep the address and phone number up to date.  ADDITIONAL GENETIC TESTING: We discussed with [name] that his genetic testing was fairly extensive.  If there are genes identified to increase cancer risk that can be analyzed in the future, we would be happy to discuss and coordinate this testing at that time.    CANCER SCREENING RECOMMENDATIONS: [name] test result is considered negative (normal).  This means that we have not identified a hereditary cause for his personal and family history of cancer at this time. Most cancers happen by chance and this negative test suggests that his cancer may fall into this category.    While reassuring, this does not definitively rule out a hereditary predisposition to cancer. It is still possible that there could be genetic mutations that are undetectable by current technology. There could be genetic mutations in genes that have not been tested or identified to increase cancer risk.  Therefore, it is recommended he continue to follow the cancer management and screening guidelines provided by his oncology and primary healthcare provider.   An individual's cancer risk and medical management are not determined by genetic test results alone. Overall cancer risk assessment incorporates additional factors, including personal medical history,  family history, and any available genetic information that may result in a personalized plan for cancer prevention and surveillance  RECOMMENDATIONS FOR FAMILY MEMBERS:  Individuals in this family might be at some increased risk of developing cancer, over the general population risk, simply due to the family history of cancer.  We recommended women in this family have a yearly mammogram beginning at age 74, or 78 years younger than the earliest onset of cancer, an annual clinical breast exam, and perform monthly breast  self-exams. Women in this family should also have a gynecological exam as recommended by their primary provider. All family members should have a colonoscopy by age 59.  FOLLOW-UP: Lastly, we discussed with [name] that cancer genetics is a rapidly advancing field and it is possible that new genetic tests will be appropriate for him and/or his family members in the future. We encouraged him to remain in contact with cancer genetics on an annual basis so we can update his personal and family histories and let him know of advances in cancer genetics that may benefit this family.   Our contact number was provided. [name] questions were answered to his satisfaction, and he knows he is welcome to call us at anytime with additional questions or concerns.   Roma Kayser, [place], [facility] Licensed, Certified Genetic Counselor [email or web address]

## 2019-02-08 NOTE — Telephone Encounter (Addendum)
Revealed negative genetic testing.  Discussed that we do not know why he has colon cancer and a history of wilms tumor or why there is cancer in the family. It could be due to a different gene that we are not testing, or maybe our current technology may not be able to pick something up.  It will be important for him to keep in contact with genetics to keep up with whether additional testing may be needed.  POLE VUS identified but these do not affect medical management.
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
## 2019-02-28 NOTE — Patient Instructions (Addendum)
DUE TO COVID-19 ONLY ONE VISITOR IS ALLOWED TO COME WITH YOU AND STAY IN THE WAITING ROOM ONLY DURING PRE OP AND PROCEDURE DAY OF SURGERY. THE 1 VISITOR MAY VISIT WITH YOU AFTER SURGERY IN YOUR PRIVATE ROOM DURING VISITING HOURS ONLY!  YOU NEED TO HAVE A COVID 19 TEST ON_______ @_______ , THIS TEST MUST BE DONE BEFORE SURGERY, COME  [name] , [address].  ([place]) ONCE YOUR COVID TEST IS COMPLETED, PLEASE BEGIN THE QUARANTINE INSTRUCTIONS AS OUTLINED IN YOUR HANDOUT.                [name]  02/28/2019   Your procedure is scheduled on: 03-04-19   Report to [facility] Main  Entrance   Report to  Short stay  at        0530  AM     Call this number if you have problems the morning of surgery [phone]    Remember: DRINK 2 [place] AT   1000 PM AND 1 PRESURGERY DRINK THE DAY OF THE PROCEDURE 3 HOURS PRIOR TO SCHEDULED SURGERY. NO   SOLIDS AFTER MIDNIGHT THE DAY PRIOR TO THE SURGERY. NOTHING BY MOUTH EXCEPT CLEAR LIQUIDS UNTIL THREE   HOURS PRIOR TO SCHEDULED SURGERY. PLEASE FINISH PRESURGERY GATORADE DRINK PER SURGEON ORDER 3   HOURS PRIOR TO SCHEDULED SURGERY TIME WHICH NEEDS TO BE COMPLETED AT ___0430 AM THEN NOTHING BY   MOUTH______.    BRUSH YOUR TEETH MORNING OF SURGERY AND RINSE YOUR MOUTH OUT, NO CHEWING GUM CANDY OR MINTS.     Take these medicines the morning of surgery with A SIP OF WATER: toprol XL, lipitor, inhaler and bring with you.              DO NOT TAKE ANY DIABETIC MEDICATIONS DAY OF YOUR SURGERY                               You may not have any metal on your body including hair pins and              piercings  Do not wear jewelry,  lotions, powders or perfumes, deodorant              Men may shave face and neck.   Do not bring valuables to the hospital. [name]  Contacts, dentures or bridgework may not be  worn into surgery.      Special Instructions:   FOLLOW BOWEL PREP PER MD ORDER   FOLLOW A CLEAR LIQUID DIET TH DAY OF YOUR BOWEL PREP TO PREVENT DEHYDRATION              Please read over the following fact sheets you were given: _____________________________________________________________________    CLEAR LIQUID DIET   Foods Allowed  Foods Excluded  Coffee and tea, regular and decaf  NO CREAMER                                           liquids that you cannot  Plain Jell-O any favor except red or purple                                                        see through such as: Fruit ices (not with fruit pulp)                                                                           milk, soups, orange juice  Iced Popsicles                                                                          All solid food                                   Cranberry, grape and apple juices Sports drinks like Gatorade Lightly seasoned clear broth or consume(fat free) Sugar, honey syrup  Sample Menu Breakfast                                Lunch                                     Supper Cranberry juice                    Beef broth                            Chicken broth Jell-O                                     Grape juice                           Apple juice Coffee or tea                        Jell-O                                      Popsicle  Coffee or tea                        Coffee or tea  _____________________________________________________________________          [name] - Preparing for Surgery Before surgery, you can play an important role.  Because skin is not sterile, your skin needs to be as free of germs as possible.  You can reduce the number of germs on your skin by washing with CHG (chlorahexidine gluconate) soap before  surgery.  CHG is an antiseptic cleaner which kills germs and bonds with the skin to continue killing germs even after washing. Please DO NOT use if you have an allergy to CHG or antibacterial soaps.  If your skin becomes reddened/irritated stop using the CHG and inform your nurse when you arrive at Short Stay. Do not shave (including legs and underarms) for at least 48 hours prior to the first CHG shower.  You may shave your face/neck. Please follow these instructions carefully:  1.  Shower with CHG Soap the night before surgery and the  morning of Surgery.  2.  If you choose to wash your hair, wash your hair first as usual with your  normal  shampoo.  3.  After you shampoo, rinse your hair and body thoroughly to remove the  shampoo.                           4.  Use CHG as you would any other liquid soap.  You can apply chg directly  to the skin and wash                       Gently with a scrungie or clean washcloth.  5.  Apply the CHG Soap to your body ONLY FROM THE NECK DOWN.   Do not use on face/ open                           Wound or open sores. Avoid contact with eyes, ears mouth and genitals (private parts).                       Wash face,  Genitals (private parts) with your normal soap.             6.  Wash thoroughly, paying special attention to the area where your surgery  will be performed.  7.  Thoroughly rinse your body with warm water from the neck down.  8.  DO NOT shower/wash with your normal soap after using and rinsing off  the CHG Soap.                9.  Pat yourself dry with a clean towel.            10.  Wear clean pajamas.            11.  Place clean sheets on your bed the night of your first shower and do not  sleep with pets. Day of Surgery : Do not apply any lotions/deodorants the morning of surgery.  Please wear clean clothes to the hospital/surgery center.  FAILURE TO FOLLOW THESE INSTRUCTIONS MAY RESULT IN THE CANCELLATION OF YOUR SURGERY PATIENT  SIGNATURE_________________________________  NURSE SIGNATURE__________________________________  ________________________________________________________________________   [name]  An incentive spirometer is a tool that can help keep your lungs  clear and active. This tool measures how well you are filling your lungs with each breath. Taking long deep breaths may help reverse or decrease the chance of developing breathing (pulmonary) problems (especially infection) following:  A long period of time when you are unable to move or be active. BEFORE THE PROCEDURE   If the spirometer includes an indicator to show your best effort, your nurse or respiratory therapist will set it to a desired goal.  If possible, sit up straight or lean slightly forward. Try not to slouch.  Hold the incentive spirometer in an upright position. INSTRUCTIONS FOR USE  1. Sit on the edge of your bed if possible, or sit up as far as you can in bed or on a chair. 2. Hold the incentive spirometer in an upright position. 3. Breathe out normally. 4. Place the mouthpiece in your mouth and seal your lips tightly around it. 5. Breathe in slowly and as deeply as possible, raising the piston or the ball toward the top of the column. 6. Hold your breath for 3-5 seconds or for as long as possible. Allow the piston or ball to fall to the bottom of the column. 7. Remove the mouthpiece from your mouth and breathe out normally. 8. Rest for a few seconds and repeat Steps 1 through 7 at least 10 times every 1-2 hours when you are awake. Take your time and take a few normal breaths between deep breaths. 9. The spirometer may include an indicator to show your best effort. Use the indicator as a goal to work toward during each repetition. 10. After each set of 10 deep breaths, practice coughing to be sure your lungs are clear. If you have an incision (the cut made at the time of surgery), support your incision when coughing  by placing a pillow or rolled up towels firmly against it. Once you are able to get out of bed, walk around indoors and cough well. You may stop using the incentive spirometer when instructed by your caregiver.  RISKS AND COMPLICATIONS  Take your time so you do not get dizzy or light-headed.  If you are in pain, you may need to take or ask for pain medication before doing incentive spirometry. It is harder to take a deep breath if you are having pain. AFTER USE  Rest and breathe slowly and easily.  It can be helpful to keep track of a log of your progress. Your caregiver can provide you with a simple table to help with this. If you are using the spirometer at home, follow these instructions: [place] IF:   You are having difficultly using the spirometer.  You have trouble using the spirometer as often as instructed.  Your pain medication is not giving enough relief while using the spirometer.  You develop fever of 100.5 F (38.1 C) or higher. SEEK IMMEDIATE MEDICAL CARE IF:   You cough up bloody sputum that had not been present before.  You develop fever of 102 F (38.9 C) or greater.  You develop worsening pain at or near the incision site. MAKE SURE YOU:   Understand these instructions.  Will watch your condition.  Will get help right away if you are not doing well or get worse. Document Released: 07/28/2006 Document Revised: 06/09/2011 Document Reviewed: 09/28/2006 ExitCare Patient Information 2014 ExitCare, [place].   ________________________________________________________________________  WHAT IS A BLOOD TRANSFUSION? Blood Transfusion Information  A transfusion is the replacement of blood or some of its parts. Blood  is made up of multiple cells which provide different functions.  Red blood cells carry oxygen and are used for blood loss replacement.  White blood cells fight against infection.  Platelets control bleeding.  Plasma helps clot  blood.  Other blood products are available for specialized needs, such as hemophilia or other clotting disorders. BEFORE THE TRANSFUSION  Who gives blood for transfusions?   Healthy volunteers who are fully evaluated to make sure their blood is safe. This is blood bank blood. Transfusion therapy is the safest it has ever been in the practice of medicine. Before blood is taken from a donor, a complete history is taken to make sure that person has no history of diseases nor engages in risky social behavior (examples are intravenous drug use or sexual activity with multiple partners). The donor's travel history is screened to minimize risk of transmitting infections, such as malaria. The donated blood is tested for signs of infectious diseases, such as HIV and hepatitis. The blood is then tested to be sure it is compatible with you in order to minimize the chance of a transfusion reaction. If you or a relative donates blood, this is often done in anticipation of surgery and is not appropriate for emergency situations. It takes many days to process the donated blood. RISKS AND COMPLICATIONS Although transfusion therapy is very safe and saves many lives, the main dangers of transfusion include:   Getting an infectious disease.  Developing a transfusion reaction. This is an allergic reaction to something in the blood you were given. Every precaution is taken to prevent this. The decision to have a blood transfusion has been considered carefully by your caregiver before blood is given. Blood is not given unless the benefits outweigh the risks. AFTER THE TRANSFUSION  Right after receiving a blood transfusion, you will usually feel much better and more energetic. This is especially true if your red blood cells have gotten low (anemic). The transfusion raises the level of the red blood cells which carry oxygen, and this usually causes an energy increase.  The nurse administering the transfusion will monitor  you carefully for complications. HOME CARE INSTRUCTIONS  No special instructions are needed after a transfusion. You may find your energy is better. Speak with your caregiver about any limitations on activity for underlying diseases you may have. SEEK MEDICAL CARE IF:   Your condition is not improving after your transfusion.  You develop redness or irritation at the intravenous (IV) site. SEEK IMMEDIATE MEDICAL CARE IF:  Any of the following symptoms occur over the next 12 hours:  Shaking chills.  You have a temperature by mouth above 102 F (38.9 C), not controlled by medicine.  Chest, back, or muscle pain.  People around you feel you are not acting correctly or are confused.  Shortness of breath or difficulty breathing.  Dizziness and fainting.  You get a rash or develop hives.  You have a decrease in urine output.  Your urine turns a dark color or changes to pink, red, or brown. Any of the following symptoms occur over the next 10 days:  You have a temperature by mouth above 102 F (38.9 C), not controlled by medicine.  Shortness of breath.  Weakness after normal activity.  The white part of the eye turns yellow (jaundice).  You have a decrease in the amount of urine or are urinating less often.  Your urine turns a dark color or changes to pink, red, or brown. Document Released: 03/14/2000 Document Revised:  06/09/2011 Document Reviewed: 11/01/2007 ExitCare Patient Information 2014 [place], [place].  _______________________________________________________________________

## 2019-03-01 ENCOUNTER — Other Ambulatory Visit (HOSPITAL_COMMUNITY)
Admission: RE | Admit: 2019-03-01 | Discharge: 2019-03-01 | Disposition: A | Discharge status: Home / Self Care | Payer: BC Managed Care – PPO | Source: Ambulatory Visit | Attending: Surgery | Admitting: Surgery

## 2019-03-01 DIAGNOSIS — Z01812 Encounter for preprocedural laboratory examination: Secondary | ICD-10-CM | POA: Insufficient documentation

## 2019-03-01 DIAGNOSIS — Z20828 Contact with and (suspected) exposure to other viral communicable diseases: Secondary | ICD-10-CM | POA: Diagnosis not present

## 2019-03-02 ENCOUNTER — Encounter (HOSPITAL_COMMUNITY)
Admission: RE | Admit: 2019-03-02 | Discharge: 2019-03-02 | Disposition: A | Discharge status: Home / Self Care | Payer: BC Managed Care – PPO | Source: Ambulatory Visit | Attending: Surgery | Admitting: Surgery

## 2019-03-02 ENCOUNTER — Encounter (HOSPITAL_COMMUNITY): Payer: Self-pay

## 2019-03-02 ENCOUNTER — Other Ambulatory Visit: Payer: Self-pay

## 2019-03-02 DIAGNOSIS — Z808 Family history of malignant neoplasm of other organs or systems: Secondary | ICD-10-CM | POA: Diagnosis not present

## 2019-03-02 DIAGNOSIS — E1122 Type 2 diabetes mellitus with diabetic chronic kidney disease: Secondary | ICD-10-CM | POA: Diagnosis not present

## 2019-03-02 DIAGNOSIS — E785 Hyperlipidemia, unspecified: Secondary | ICD-10-CM | POA: Insufficient documentation

## 2019-03-02 DIAGNOSIS — Z800 Family history of malignant neoplasm of digestive organs: Secondary | ICD-10-CM | POA: Diagnosis not present

## 2019-03-02 DIAGNOSIS — Z7982 Long term (current) use of aspirin: Secondary | ICD-10-CM | POA: Insufficient documentation

## 2019-03-02 DIAGNOSIS — C184 Malignant neoplasm of transverse colon: Secondary | ICD-10-CM | POA: Insufficient documentation

## 2019-03-02 DIAGNOSIS — Z01812 Encounter for preprocedural laboratory examination: Secondary | ICD-10-CM | POA: Insufficient documentation

## 2019-03-02 DIAGNOSIS — Z7984 Long term (current) use of oral hypoglycemic drugs: Secondary | ICD-10-CM | POA: Diagnosis not present

## 2019-03-02 DIAGNOSIS — J45909 Unspecified asthma, uncomplicated: Secondary | ICD-10-CM | POA: Diagnosis not present

## 2019-03-02 DIAGNOSIS — I10 Essential (primary) hypertension: Secondary | ICD-10-CM | POA: Diagnosis not present

## 2019-03-02 DIAGNOSIS — D739 Disease of spleen, unspecified: Secondary | ICD-10-CM | POA: Diagnosis not present

## 2019-03-02 DIAGNOSIS — Z79899 Other long term (current) drug therapy: Secondary | ICD-10-CM | POA: Diagnosis not present

## 2019-03-02 DIAGNOSIS — Z905 Acquired absence of kidney: Secondary | ICD-10-CM | POA: Diagnosis not present

## 2019-03-02 HISTORY — DX: Pneumonia, unspecified organism: J18.9

## 2019-03-02 LAB — BASIC METABOLIC PANEL
Anion gap: 10 (ref 5–15)
BUN: 18 mg/dL (ref 6–20)
CO2: 25 mmol/L (ref 22–32)
Calcium: 9.4 mg/dL (ref 8.9–10.3)
Chloride: 102 mmol/L (ref 98–111)
Creatinine, Ser: 1.06 mg/dL (ref 0.61–1.24)
GFR calc Af Amer: >60 mL/min (ref >60)
GFR calc non Af Amer: >60 mL/min (ref >60)
Glucose, Bld: 148 mg/dL — ABNORMAL HIGH (ref 70–99)
Potassium: 4.7 mmol/L (ref 3.5–5.1)
Sodium: 137 mmol/L (ref 135–145)

## 2019-03-02 LAB — GLUCOSE, CAPILLARY: Glucose-Capillary: 153 mg/dL — ABNORMAL HIGH (ref 70–99)

## 2019-03-02 LAB — ABO/RH: ABO/RH(D): A POS

## 2019-03-02 LAB — HEMOGLOBIN A1C
Hgb A1c MFr Bld: 7.4 % — ABNORMAL HIGH (ref 4.8–5.6)
Mean Plasma Glucose: 165.68 mg/dL

## 2019-03-02 LAB — CBC
HCT: 47.3 % (ref 39.0–52.0)
Hemoglobin: 16.1 g/dL (ref 13.0–17.0)
MCH: 32 pg (ref 26.0–34.0)
MCHC: 34 g/dL (ref 30.0–36.0)
MCV: 94 fL (ref 80.0–100.0)
Platelets: 153 10*3/uL (ref 150–400)
RBC: 5.03 MIL/uL (ref 4.22–5.81)
RDW: 12.1 % (ref 11.5–15.5)
WBC: 5.8 10*3/uL (ref 4.0–10.5)
nRBC: 0 % (ref 0.0–0.2)

## 2019-03-02 LAB — NOVEL CORONAVIRUS, NAA (HOSP ORDER, SEND-OUT TO REF LAB; TAT 18-24 HRS): SARS-CoV-2, NAA: NOT DETECTED

## 2019-03-03 MED ORDER — BUPIVACAINE LIPOSOME 1.3 % IJ SUSP
20.0000 mL | Freq: Once | INTRAMUSCULAR | Status: DC
  Filled 2019-03-03: qty 20

## 2019-03-03 MED ORDER — SODIUM CHLORIDE 0.9 % IV SOLN
INTRAVENOUS | Status: DC
  Filled 2019-03-03 (×2): qty 6

## 2019-03-03 NOTE — Anesthesia Preprocedure Evaluation (Addendum)
Anesthesia Evaluation  Patient identified by MRN, date of birth, ID band Patient awake    Reviewed: Allergy & Precautions, NPO status , Patient's Chart, lab work & pertinent test results  History of Anesthesia Complications (+) PONV and history of anesthetic complications  Airway Mallampati: II  TM Distance: >3 FB Neck ROM: Full    Dental no notable dental hx.    Pulmonary asthma ,    Pulmonary exam normal        Cardiovascular hypertension, Pt. on medications and Pt. on home beta blockers Normal cardiovascular exam  TTE 01/20/19: EF 60-65%, severe MVR with jet wrapping around left atrium, partially flail leaflet of posterior mitral valve, mildly elevated PASP  Hx of endocarditis   Neuro/Psych negative neurological ROS  negative psych ROS   GI/Hepatic Neg liver ROS, hiatal hernia,   Endo/Other  diabetes, Type 2, Oral Hypoglycemic Agents  Renal/GU negative Renal ROSHx of Wilm's tumor s/p nephrectomy  negative genitourinary   Musculoskeletal  (+) Arthritis ,   Abdominal   Peds  Hematology negative hematology ROS (+)   Anesthesia Other Findings Day of surgery medications reviewed with patient.  Reproductive/Obstetrics negative OB ROS                           Anesthesia Physical Anesthesia Plan  ASA: III  Anesthesia Plan: General   Post-op Pain Management:    Induction: Intravenous  PONV Risk Score and Plan: 4 or greater and Treatment may vary due to age or medical condition, Ondansetron, Dexamethasone, Midazolam and Propofol infusion  Airway Management Planned: Oral ETT  Additional Equipment: Arterial line  Intra-op Plan:   Post-operative Plan: Extubation in OR  Informed Consent: I have reviewed the patients History and Physical, chart, labs and discussed the procedure including the risks, benefits and alternatives for the proposed anesthesia with the patient or authorized  representative who has indicated his/her understanding and acceptance.     Dental advisory given  Plan Discussed with: CRNA  Anesthesia Plan Comments: (See PAT note 03/02/2019.  Requires SBE prophylaxis perioperatively)     Anesthesia Quick Evaluation

## 2019-03-03 NOTE — Progress Notes (Signed)
Anesthesia Chart Review   Case: [id number] Date/Time: 03/04/19 0700   Procedure: XI ROBOT ASSISTED PARTIAL COLECTOMY, POSSIBLE SPLENECTOMY (N/A )   Anesthesia type: General   Pre-op diagnosis: TRANSVERSE COLON CANCER, SPLENIC MASS   Location: [facility] ROOM 02 / [facility]   Surgeon: Michael Boston, MD      DISCUSSION:53 y.o. 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 Michael Boston.   Pt last seen by cardiology 01/18/2019.  Seen by Richardson Dopp, PA-C.  Per OV note, "The patient's echocardiogram now demonstrates severe mitral regurgitation.  I reviewed this with Dr. Irish Lack who contacted the patient by telephone.  His mitral valve disease does not prevent him from proceeding with his colon surgery.  He will eventually need mitral valve repair at some point in the near future.  He may proceed with his colon surgery at acceptable risk.  He should have SBE prophylaxis perioperatively."  Anticipate pt can proceed with planned procedure barring acute status change.   VS: BP (!) 165/85   Pulse 91   Temp 36.8 C (Oral)   Resp 16   Ht 6' (1.829 m)   Wt 92.5 kg   SpO2 98%   BMI 27.67 kg/m   PROVIDERS: Orpah Melter, MD is PCP   Larae Grooms, MD is Cardiologist  LABS: Labs reviewed: Acceptable for surgery. (all labs ordered are listed, but only abnormal results are displayed)  Labs Reviewed  GLUCOSE, CAPILLARY - Abnormal; Notable for the following components:      Result Value   Glucose-Capillary 153 (*)    All other components within normal limits  HEMOGLOBIN A1C - Abnormal; Notable for the following components:   Hgb A1c MFr Bld 7.4 (*)    All other components within normal limits  BASIC METABOLIC PANEL - Abnormal; Notable for the following components:   Glucose, Bld 148 (*)    All other  components within normal limits  CBC  TYPE AND SCREEN  ABO/RH     IMAGES:   EKG: 01/18/2019 Rate 77 bpm Normal sinus rhythm   CV: Echo 01/20/2019 IMPRESSIONS   1. Left ventricular ejection fraction, by visual estimation, is 60 to 65%. The left ventricle has normal function. Normal left ventricular size. There is no left ventricular hypertrophy.  2. Global right ventricle has normal systolic function.The right ventricular size is normal. No increase in right ventricular wall thickness.  3. Left atrial size was normal.  4. Right atrial size was normal.  5. Moderate thickening of the mitral valve leaflet(s).  6. The mitral valve is myxomatous. Severe mitral valve regurgitation. No evidence of mitral stenosis.  7. Partially flail leaflet of posterior mitral valve. Mitral regurgitation is severe, with jet wrapping around left atrium.  8. The tricuspid valve is myxomatous. Tricuspid valve regurgitation is trivial.  9. The aortic valve is tricuspid Aortic valve regurgitation was not visualized by color flow Doppler. Structurally normal aortic valve, with no evidence of sclerosis or stenosis. 10. The pulmonic valve was normal in structure. Pulmonic valve regurgitation is not visualized by color flow Doppler. 11. There is mild dilatation of the ascending aorta measuring 39 mm. 12. Mildly elevated pulmonary artery systolic pressure. 13. The inferior vena cava is normal in size with greater than 50% respiratory variability, suggesting right atrial pressure of 3 mmHg. Past Medical History:  Diagnosis Date  . Allergic  rhinitis   . Asthma    animal dander & seasonal asthma  . Colon cancer ([place]) 01/26/2019  . Diabetes mellitus without complication ([place])    dx 2010  type 2  . Discitis of lumbosacral region    first started with this ..and rolled onto the endocarditis    stayed a month  . 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 Family history of colon cancer    . Family history of thyroid cancer   . H/O scoliosis   . 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 History of hiatal hernia   . Hyperlipidemia   . Hypertension    Diagnostic exercise tolerance test assessment:04/30/2010 : comments normal -no evidence os ischemia by ST analysis  . lipoma   . Lipoma 01/26/2019  . Mitral regurgitation    Echo 12/2018: EF 60-65, myxomatous mitral valve, severe mitral regurgitation, partial flail leaflet of posterior mitral valve, myxomatous tricuspid valve with trivial TR, ascending aorta mildly dilated (39 mm)  . Pneumonia   . PONV (postoperative nausea and vomiting)   . Solitary kidney   . 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 BIOPSY  01/07/2019   Procedure: BIOPSY;  Surgeon: Wilford Corner, MD;  Location: [facility];  Service: Endoscopy;;  . COLONOSCOPY WITH PROPOFOL N/A 01/07/2019   Procedure: COLONOSCOPY WITH PROPOFOL;  Surgeon: Wilford Corner, MD;  [facility];  Service: Endoscopy;  Laterality: N/A;  . INGUINAL HERNIA REPAIR Left 04/23/2016   Procedure: LAPAROSCOPIC  INGUINAL REPAIR;  Surgeon: Clovis Riley, MD;  Location: [facility];  Service: General;  Laterality: Left;  . KNEE SURGERY     arthroscopic left knee  . LIPOMA EXCISION    . SUBMUCOSAL INJECTION  01/07/2019   Procedure: SUBMUCOSAL INJECTION;  Surgeon: Wilford Corner, MD;  [facility];  Service: Endoscopy;;  . THYROID SURGERY    . TONSILLECTOMY    . TOTAL NEPHRECTOMY Left     MEDICATIONS: . albuterol (PROVENTIL HFA;VENTOLIN HFA) 108 (90 BASE) MCG/ACT inhaler  . aspirin EC 81 MG tablet  . atorvastatin (LIPITOR) 10 MG tablet  . calcium carbonate (TUMS - DOSED IN MG ELEMENTAL CALCIUM) 500 MG chewable tablet  . lisinopril (PRINIVIL,ZESTRIL) 2.5 MG tablet  . metFORMIN (GLUCOPHAGE) 1000 MG tablet  . mometasone (ASMANEX) 220 MCG/INH inhaler  . Multiple Vitamin (MULTIVITAMIN WITH  MINERALS) TABS tablet  . Omega-3 Fatty Acids (FISH OIL) 1000 MG CAPS  . ONE TOUCH ULTRA TEST test strip  . repaglinide (PRANDIN) 1 MG tablet  . TOPROL XL 25 MG 24 hr tablet   No current facility-administered medications for this encounter.    Derrill Memo ON 03/04/2019] bupivacaine liposome (EXPAREL) 1.3 % injection 266 mg  . [START ON 03/04/2019] clindamycin (CLEOCIN) 900 mg, gentamicin (GARAMYCIN) 240 mg in sodium chloride 0.9 % 1,000 mL for intraperitoneal lavage    Maia Plan [facility] Pre-Surgical Testing [phone] 03/03/19  1:47 PM

## 2019-03-04 ENCOUNTER — Encounter (HOSPITAL_COMMUNITY)
Admission: RE | Disposition: A | Discharge status: Home / Self Care | Payer: Self-pay | Source: Home / Self Care | Attending: Surgery

## 2019-03-04 ENCOUNTER — Inpatient Hospital Stay (HOSPITAL_COMMUNITY)
Admission: RE | Admit: 2019-03-04 | Discharge: 2019-03-06 | DRG: 330 | Disposition: A | Discharge status: Home / Self Care | Payer: BC Managed Care – PPO | Attending: Surgery | Admitting: Surgery

## 2019-03-04 ENCOUNTER — Other Ambulatory Visit: Payer: Self-pay

## 2019-03-04 ENCOUNTER — Encounter (HOSPITAL_COMMUNITY): Payer: Self-pay | Admitting: Emergency Medicine

## 2019-03-04 ENCOUNTER — Inpatient Hospital Stay (HOSPITAL_COMMUNITY): Payer: BC Managed Care – PPO | Admitting: Anesthesiology

## 2019-03-04 DIAGNOSIS — D739 Disease of spleen, unspecified: Secondary | ICD-10-CM | POA: Diagnosis not present

## 2019-03-04 DIAGNOSIS — D124 Benign neoplasm of descending colon: Secondary | ICD-10-CM | POA: Diagnosis not present

## 2019-03-04 DIAGNOSIS — E119 Type 2 diabetes mellitus without complications: Secondary | ICD-10-CM | POA: Diagnosis not present

## 2019-03-04 DIAGNOSIS — R161 Splenomegaly, not elsewhere classified: Secondary | ICD-10-CM | POA: Diagnosis not present

## 2019-03-04 DIAGNOSIS — Z825 Family history of asthma and other chronic lower respiratory diseases: Secondary | ICD-10-CM | POA: Diagnosis not present

## 2019-03-04 DIAGNOSIS — Z833 Family history of diabetes mellitus: Secondary | ICD-10-CM

## 2019-03-04 DIAGNOSIS — I10 Essential (primary) hypertension: Secondary | ICD-10-CM | POA: Diagnosis present

## 2019-03-04 DIAGNOSIS — Z7984 Long term (current) use of oral hypoglycemic drugs: Secondary | ICD-10-CM

## 2019-03-04 DIAGNOSIS — M4120 Other idiopathic scoliosis, site unspecified: Secondary | ICD-10-CM | POA: Diagnosis present

## 2019-03-04 DIAGNOSIS — Z905 Acquired absence of kidney: Secondary | ICD-10-CM

## 2019-03-04 DIAGNOSIS — Z85038 Personal history of other malignant neoplasm of large intestine: Secondary | ICD-10-CM

## 2019-03-04 DIAGNOSIS — Z8249 Family history of ischemic heart disease and other diseases of the circulatory system: Secondary | ICD-10-CM | POA: Diagnosis not present

## 2019-03-04 DIAGNOSIS — D1803 Hemangioma of intra-abdominal structures: Secondary | ICD-10-CM | POA: Diagnosis present

## 2019-03-04 DIAGNOSIS — Z79899 Other long term (current) drug therapy: Secondary | ICD-10-CM | POA: Diagnosis not present

## 2019-03-04 DIAGNOSIS — Z7982 Long term (current) use of aspirin: Secondary | ICD-10-CM | POA: Diagnosis not present

## 2019-03-04 DIAGNOSIS — J45909 Unspecified asthma, uncomplicated: Secondary | ICD-10-CM | POA: Diagnosis present

## 2019-03-04 DIAGNOSIS — D179 Benign lipomatous neoplasm, unspecified: Secondary | ICD-10-CM | POA: Diagnosis not present

## 2019-03-04 DIAGNOSIS — C184 Malignant neoplasm of transverse colon: Secondary | ICD-10-CM | POA: Diagnosis present

## 2019-03-04 DIAGNOSIS — Z85528 Personal history of other malignant neoplasm of kidney: Secondary | ICD-10-CM | POA: Diagnosis not present

## 2019-03-04 DIAGNOSIS — Z23 Encounter for immunization: Secondary | ICD-10-CM | POA: Diagnosis not present

## 2019-03-04 DIAGNOSIS — M4150 Other secondary scoliosis, site unspecified: Secondary | ICD-10-CM | POA: Diagnosis present

## 2019-03-04 DIAGNOSIS — C186 Malignant neoplasm of descending colon: Principal | ICD-10-CM | POA: Diagnosis present

## 2019-03-04 DIAGNOSIS — C772 Secondary and unspecified malignant neoplasm of intra-abdominal lymph nodes: Secondary | ICD-10-CM | POA: Diagnosis not present

## 2019-03-04 DIAGNOSIS — C189 Malignant neoplasm of colon, unspecified: Secondary | ICD-10-CM

## 2019-03-04 DIAGNOSIS — K432 Incisional hernia without obstruction or gangrene: Secondary | ICD-10-CM | POA: Diagnosis present

## 2019-03-04 DIAGNOSIS — D7389 Other diseases of spleen: Secondary | ICD-10-CM | POA: Diagnosis not present

## 2019-03-04 HISTORY — PX: COLON SURGERY: SHX602

## 2019-03-04 LAB — GLUCOSE, CAPILLARY
Glucose-Capillary: 200 mg/dL — ABNORMAL HIGH (ref 70–99)
Glucose-Capillary: 217 mg/dL — ABNORMAL HIGH (ref 70–99)
Glucose-Capillary: 248 mg/dL — ABNORMAL HIGH (ref 70–99)
Glucose-Capillary: 121 mg/dL — ABNORMAL HIGH (ref 70–99)

## 2019-03-04 LAB — TYPE AND SCREEN
ABO/RH(D): A POS
Antibody Screen: NEGATIVE

## 2019-03-04 SURGERY — COLECTOMY, PARTIAL, ROBOT-ASSISTED, LAPAROSCOPIC
Anesthesia: General | Site: Abdomen

## 2019-03-04 MED ORDER — GABAPENTIN 300 MG PO CAPS
300.0000 mg | ORAL_CAPSULE | ORAL | Status: AC
  Administered 2019-03-04: 300 mg via ORAL
  Filled 2019-03-04: qty 1

## 2019-03-04 MED ORDER — OXYCODONE HCL 5 MG/5ML PO SOLN: 5.0000 mg | Freq: Once | ORAL | Status: DC | PRN

## 2019-03-04 MED ORDER — LACTATED RINGERS IV SOLN
INTRAVENOUS | Status: DC
  Administered 2019-03-04 (×2): via INTRAVENOUS

## 2019-03-04 MED ORDER — ONDANSETRON HCL 4 MG PO TABS: 4.0000 mg | ORAL_TABLET | Freq: Four times a day (QID) | ORAL | Status: DC | PRN

## 2019-03-04 MED ORDER — METRONIDAZOLE 500 MG PO TABS: 1000.0000 mg | ORAL_TABLET | ORAL | Status: DC

## 2019-03-04 MED ORDER — PROCHLORPERAZINE EDISYLATE 10 MG/2ML IJ SOLN: 5.0000 mg | Freq: Four times a day (QID) | INTRAMUSCULAR | Status: DC | PRN

## 2019-03-04 MED ORDER — ENOXAPARIN SODIUM 40 MG/0.4ML ~~LOC~~ SOLN
40.0000 mg | SUBCUTANEOUS | Status: DC
  Administered 2019-03-05 – 2019-03-06 (×2): 40 mg via SUBCUTANEOUS
  Filled 2019-03-04 (×2): qty 0.4

## 2019-03-04 MED ORDER — PHENYLEPHRINE 40 MCG/ML (10ML) SYRINGE FOR IV PUSH (FOR BLOOD PRESSURE SUPPORT)
PREFILLED_SYRINGE | INTRAVENOUS | Status: AC
  Filled 2019-03-04: qty 10

## 2019-03-04 MED ORDER — HYDROMORPHONE HCL 1 MG/ML IJ SOLN: 0.5000 mg | INTRAMUSCULAR | Status: DC | PRN

## 2019-03-04 MED ORDER — MIDAZOLAM HCL 5 MG/5ML IJ SOLN
INTRAMUSCULAR | Status: DC | PRN
  Administered 2019-03-04: 2 mg via INTRAVENOUS

## 2019-03-04 MED ORDER — SODIUM CHLORIDE 0.9 % IV SOLN: INTRAVENOUS | Status: DC

## 2019-03-04 MED ORDER — FENTANYL CITRATE (PF) 250 MCG/5ML IJ SOLN
INTRAMUSCULAR | Status: DC | PRN
  Administered 2019-03-04 (×5): 50 ug via INTRAVENOUS

## 2019-03-04 MED ORDER — CALCIUM CARBONATE ANTACID 500 MG PO CHEW: 1.0000 | CHEWABLE_TABLET | Freq: Every day | ORAL | Status: DC | PRN

## 2019-03-04 MED ORDER — PHENYLEPHRINE HCL (PRESSORS) 10 MG/ML IV SOLN
INTRAVENOUS | Status: AC
  Filled 2019-03-04: qty 1

## 2019-03-04 MED ORDER — BUPIVACAINE LIPOSOME 1.3 % IJ SUSP
INTRAMUSCULAR | Status: DC | PRN
  Administered 2019-03-04: 20 mL

## 2019-03-04 MED ORDER — ENALAPRILAT 1.25 MG/ML IV SOLN
0.6250 mg | Freq: Four times a day (QID) | INTRAVENOUS | Status: DC | PRN
  Filled 2019-03-04: qty 1

## 2019-03-04 MED ORDER — ADULT MULTIVITAMIN W/MINERALS CH
1.0000 | ORAL_TABLET | Freq: Every day | ORAL | Status: DC
  Administered 2019-03-05: 1 via ORAL
  Filled 2019-03-04: qty 1

## 2019-03-04 MED ORDER — LIDOCAINE HCL (CARDIAC) PF 100 MG/5ML IV SOSY
PREFILLED_SYRINGE | INTRAVENOUS | Status: DC | PRN
  Administered 2019-03-04: 100 mg via INTRAVENOUS

## 2019-03-04 MED ORDER — ALUM & MAG HYDROXIDE-SIMETH 200-200-20 MG/5ML PO SUSP: 30.0000 mL | Freq: Four times a day (QID) | ORAL | Status: DC | PRN

## 2019-03-04 MED ORDER — ACETAMINOPHEN 500 MG PO TABS
1000.0000 mg | ORAL_TABLET | Freq: Once | ORAL | Status: DC
  Filled 2019-03-04: qty 2

## 2019-03-04 MED ORDER — EPHEDRINE 5 MG/ML INJ
INTRAVENOUS | Status: AC
  Filled 2019-03-04: qty 10

## 2019-03-04 MED ORDER — GABAPENTIN 100 MG PO CAPS
200.0000 mg | ORAL_CAPSULE | Freq: Three times a day (TID) | ORAL | Status: DC
  Administered 2019-03-04 – 2019-03-05 (×5): 200 mg via ORAL
  Filled 2019-03-04 (×5): qty 2

## 2019-03-04 MED ORDER — ROCURONIUM BROMIDE 10 MG/ML (PF) SYRINGE
PREFILLED_SYRINGE | INTRAVENOUS | Status: AC
  Filled 2019-03-04: qty 10

## 2019-03-04 MED ORDER — BUPIVACAINE-EPINEPHRINE (PF) 0.25% -1:200000 IJ SOLN
INTRAMUSCULAR | Status: DC | PRN
  Administered 2019-03-04: 50 mL via PERINEURAL

## 2019-03-04 MED ORDER — PSYLLIUM 95 % PO PACK
1.0000 | PACK | Freq: Every day | ORAL | Status: DC
  Administered 2019-03-05: 1 via ORAL
  Filled 2019-03-04: qty 1

## 2019-03-04 MED ORDER — ONDANSETRON HCL 4 MG/2ML IJ SOLN
INTRAMUSCULAR | Status: DC | PRN
  Administered 2019-03-04: 4 mg via INTRAVENOUS

## 2019-03-04 MED ORDER — SODIUM CHLORIDE 0.9 % IV SOLN
2.0000 g | Freq: Two times a day (BID) | INTRAVENOUS | Status: AC
  Administered 2019-03-04: 2 g via INTRAVENOUS
  Filled 2019-03-04: qty 2

## 2019-03-04 MED ORDER — SODIUM CHLORIDE 0.9 % IV SOLN
INTRAVENOUS | Status: DC | PRN
  Administered 2019-03-04: 1000 mL

## 2019-03-04 MED ORDER — SUGAMMADEX SODIUM 200 MG/2ML IV SOLN
INTRAVENOUS | Status: DC | PRN
  Administered 2019-03-04: 200 mg via INTRAVENOUS

## 2019-03-04 MED ORDER — PROCHLORPERAZINE MALEATE 10 MG PO TABS: 10.0000 mg | ORAL_TABLET | Freq: Four times a day (QID) | ORAL | Status: DC | PRN

## 2019-03-04 MED ORDER — SODIUM CHLORIDE 0.9 % IV SOLN: Freq: Three times a day (TID) | INTRAVENOUS | Status: DC | PRN

## 2019-03-04 MED ORDER — LACTATED RINGERS IV SOLN
INTRAVENOUS | Status: AC
  Administered 2019-03-04: 15:00:00 via INTRAVENOUS

## 2019-03-04 MED ORDER — ALVIMOPAN 12 MG PO CAPS: 12.0000 mg | ORAL_CAPSULE | Freq: Two times a day (BID) | ORAL | Status: DC

## 2019-03-04 MED ORDER — ATORVASTATIN CALCIUM 10 MG PO TABS
10.0000 mg | ORAL_TABLET | Freq: Every day | ORAL | Status: DC
  Administered 2019-03-05: 10 mg via ORAL
  Filled 2019-03-04: qty 1

## 2019-03-04 MED ORDER — REPAGLINIDE 1 MG PO TABS
1.0000 mg | ORAL_TABLET | Freq: Two times a day (BID) | ORAL | Status: DC
  Administered 2019-03-04 – 2019-03-06 (×3): 1 mg via ORAL
  Filled 2019-03-04 (×4): qty 1

## 2019-03-04 MED ORDER — PHENYLEPHRINE 40 MCG/ML (10ML) SYRINGE FOR IV PUSH (FOR BLOOD PRESSURE SUPPORT)
PREFILLED_SYRINGE | INTRAVENOUS | Status: DC | PRN
  Administered 2019-03-04: 120 ug via INTRAVENOUS
  Administered 2019-03-04 (×5): 80 ug via INTRAVENOUS

## 2019-03-04 MED ORDER — INDOCYANINE GREEN 25 MG IV SOLR
INTRAVENOUS | Status: DC | PRN
  Administered 2019-03-04: 7.5 mg via INTRAVENOUS

## 2019-03-04 MED ORDER — MIDAZOLAM HCL 2 MG/2ML IJ SOLN
INTRAMUSCULAR | Status: AC
  Filled 2019-03-04: qty 2

## 2019-03-04 MED ORDER — ENOXAPARIN SODIUM 40 MG/0.4ML ~~LOC~~ SOLN
40.0000 mg | Freq: Once | SUBCUTANEOUS | Status: AC
  Administered 2019-03-04: 40 mg via SUBCUTANEOUS
  Filled 2019-03-04: qty 0.4

## 2019-03-04 MED ORDER — GLUCOSE BLOOD VI STRP: 1.0000 | ORAL_STRIP | Status: DC | PRN

## 2019-03-04 MED ORDER — ACETAMINOPHEN 500 MG PO TABS
1000.0000 mg | ORAL_TABLET | ORAL | Status: AC
  Administered 2019-03-04: 1000 mg via ORAL

## 2019-03-04 MED ORDER — PROPOFOL 10 MG/ML IV BOLUS
INTRAVENOUS | Status: AC
  Filled 2019-03-04: qty 20

## 2019-03-04 MED ORDER — INSULIN ASPART 100 UNIT/ML ~~LOC~~ SOLN: 0.0000 [IU] | Freq: Every day | SUBCUTANEOUS | Status: DC

## 2019-03-04 MED ORDER — ALVIMOPAN 12 MG PO CAPS
12.0000 mg | ORAL_CAPSULE | ORAL | Status: AC
  Administered 2019-03-04: 12 mg via ORAL
  Filled 2019-03-04: qty 1

## 2019-03-04 MED ORDER — LIDOCAINE 20MG/ML (2%) 15 ML SYRINGE OPTIME
INTRAMUSCULAR | Status: DC | PRN
  Administered 2019-03-04: 1.5 mg/kg/h via INTRAVENOUS

## 2019-03-04 MED ORDER — CELECOXIB 200 MG PO CAPS
200.0000 mg | ORAL_CAPSULE | ORAL | Status: AC
  Administered 2019-03-04: 200 mg via ORAL
  Filled 2019-03-04: qty 1

## 2019-03-04 MED ORDER — PNEUMOCOCCAL VAC POLYVALENT 25 MCG/0.5ML IJ INJ
0.5000 mL | INJECTION | INTRAMUSCULAR | Status: AC
  Administered 2019-03-05: 0.5 mL via INTRAMUSCULAR
  Filled 2019-03-04: qty 0.5

## 2019-03-04 MED ORDER — EPHEDRINE SULFATE 50 MG/ML IJ SOLN
INTRAMUSCULAR | Status: DC | PRN
  Administered 2019-03-04: 5 mg via INTRAVENOUS

## 2019-03-04 MED ORDER — PROPOFOL 10 MG/ML IV BOLUS
INTRAVENOUS | Status: DC | PRN
  Administered 2019-03-04: 200 mg via INTRAVENOUS

## 2019-03-04 MED ORDER — ONDANSETRON HCL 4 MG/2ML IJ SOLN
INTRAMUSCULAR | Status: AC
  Filled 2019-03-04: qty 2

## 2019-03-04 MED ORDER — METOPROLOL TARTRATE 12.5 MG HALF TABLET
12.5000 mg | ORAL_TABLET | Freq: Two times a day (BID) | ORAL | Status: DC
  Administered 2019-03-04 – 2019-03-05 (×3): 12.5 mg via ORAL
  Filled 2019-03-04 (×3): qty 1

## 2019-03-04 MED ORDER — ALBUTEROL SULFATE (2.5 MG/3ML) 0.083% IN NEBU: 2.5000 mg | INHALATION_SOLUTION | Freq: Four times a day (QID) | RESPIRATORY_TRACT | Status: DC | PRN

## 2019-03-04 MED ORDER — NEOMYCIN SULFATE 500 MG PO TABS: 1000.0000 mg | ORAL_TABLET | ORAL | Status: DC

## 2019-03-04 MED ORDER — KETAMINE HCL 10 MG/ML IJ SOLN
INTRAMUSCULAR | Status: AC
  Filled 2019-03-04: qty 1

## 2019-03-04 MED ORDER — ONDANSETRON HCL 4 MG/2ML IJ SOLN: 4.0000 mg | Freq: Four times a day (QID) | INTRAMUSCULAR | Status: DC | PRN

## 2019-03-04 MED ORDER — SODIUM CHLORIDE 0.9 % IV SOLN
2.0000 g | INTRAVENOUS | Status: AC
  Administered 2019-03-04: 08:00:00 2 g via INTRAVENOUS
  Filled 2019-03-04: qty 2

## 2019-03-04 MED ORDER — ACETAMINOPHEN 500 MG PO TABS
1000.0000 mg | ORAL_TABLET | Freq: Four times a day (QID) | ORAL | Status: DC
  Administered 2019-03-04 – 2019-03-06 (×7): 1000 mg via ORAL
  Filled 2019-03-04 (×8): qty 2

## 2019-03-04 MED ORDER — DIPHENHYDRAMINE HCL 12.5 MG/5ML PO ELIX: 12.5000 mg | ORAL_SOLUTION | Freq: Four times a day (QID) | ORAL | Status: DC | PRN

## 2019-03-04 MED ORDER — FENTANYL CITRATE (PF) 250 MCG/5ML IJ SOLN
INTRAMUSCULAR | Status: AC
  Filled 2019-03-04: qty 5

## 2019-03-04 MED ORDER — LISINOPRIL 5 MG PO TABS
2.5000 mg | ORAL_TABLET | Freq: Every day | ORAL | Status: DC
  Administered 2019-03-04 – 2019-03-05 (×2): 2.5 mg via ORAL
  Filled 2019-03-04 (×2): qty 1

## 2019-03-04 MED ORDER — PROMETHAZINE HCL 25 MG/ML IJ SOLN: 6.2500 mg | INTRAMUSCULAR | Status: DC | PRN

## 2019-03-04 MED ORDER — SODIUM CHLORIDE 0.9 % IV SOLN
2.0000 g | Freq: Once | INTRAVENOUS | Status: DC
  Filled 2019-03-04: qty 2000

## 2019-03-04 MED ORDER — ENSURE SURGERY PO LIQD
237.0000 mL | Freq: Two times a day (BID) | ORAL | Status: DC
  Filled 2019-03-04 (×4): qty 237

## 2019-03-04 MED ORDER — BUDESONIDE 0.5 MG/2ML IN SUSP
0.5000 mg | Freq: Two times a day (BID) | RESPIRATORY_TRACT | Status: DC
  Filled 2019-03-04 (×2): qty 2

## 2019-03-04 MED ORDER — ROCURONIUM BROMIDE 100 MG/10ML IV SOLN
INTRAVENOUS | Status: DC | PRN
  Administered 2019-03-04: 70 mg via INTRAVENOUS
  Administered 2019-03-04 (×2): 20 mg via INTRAVENOUS
  Administered 2019-03-04: 30 mg via INTRAVENOUS
  Administered 2019-03-04: 20 mg via INTRAVENOUS

## 2019-03-04 MED ORDER — LACTATED RINGERS IV SOLN
INTRAVENOUS | Status: DC | PRN
  Administered 2019-03-04 (×2): via INTRAVENOUS

## 2019-03-04 MED ORDER — ASPIRIN EC 81 MG PO TBEC
81.0000 mg | DELAYED_RELEASE_TABLET | Freq: Every evening | ORAL | Status: DC
  Administered 2019-03-04 – 2019-03-05 (×2): 81 mg via ORAL
  Filled 2019-03-04 (×2): qty 1

## 2019-03-04 MED ORDER — POLYETHYLENE GLYCOL 3350 17 GM/SCOOP PO POWD: 1.0000 | Freq: Once | ORAL | Status: DC

## 2019-03-04 MED ORDER — METFORMIN HCL 500 MG PO TABS
1000.0000 mg | ORAL_TABLET | Freq: Two times a day (BID) | ORAL | Status: DC
  Administered 2019-03-04 – 2019-03-06 (×4): 1000 mg via ORAL
  Filled 2019-03-04 (×4): qty 2

## 2019-03-04 MED ORDER — BISACODYL 5 MG PO TBEC
20.0000 mg | DELAYED_RELEASE_TABLET | Freq: Once | ORAL | Status: DC
  Filled 2019-03-04: qty 4

## 2019-03-04 MED ORDER — OXYCODONE HCL 5 MG PO TABS: 5.0000 mg | ORAL_TABLET | Freq: Once | ORAL | Status: DC | PRN

## 2019-03-04 MED ORDER — TRAMADOL HCL 50 MG PO TABS: 50.0000 mg | ORAL_TABLET | Freq: Four times a day (QID) | ORAL | 0 refills | Status: DC | PRN

## 2019-03-04 MED ORDER — BUPIVACAINE-EPINEPHRINE 0.25% -1:200000 IJ SOLN
INTRAMUSCULAR | Status: AC
  Filled 2019-03-04: qty 2

## 2019-03-04 MED ORDER — HYDROMORPHONE HCL 1 MG/ML IJ SOLN: 0.2500 mg | INTRAMUSCULAR | Status: DC | PRN

## 2019-03-04 MED ORDER — TRAMADOL HCL 50 MG PO TABS: 50.0000 mg | ORAL_TABLET | Freq: Four times a day (QID) | ORAL | Status: DC | PRN

## 2019-03-04 MED ORDER — LACTATED RINGERS IR SOLN
Status: DC | PRN
  Administered 2019-03-04: 1000 mL

## 2019-03-04 MED ORDER — INSULIN ASPART 100 UNIT/ML ~~LOC~~ SOLN
0.0000 [IU] | Freq: Three times a day (TID) | SUBCUTANEOUS | Status: DC
  Administered 2019-03-04: 5 [IU] via SUBCUTANEOUS
  Administered 2019-03-05 – 2019-03-06 (×2): 2 [IU] via SUBCUTANEOUS

## 2019-03-04 MED ORDER — METOPROLOL TARTRATE 5 MG/5ML IV SOLN: 5.0000 mg | Freq: Four times a day (QID) | INTRAVENOUS | Status: DC | PRN

## 2019-03-04 MED ORDER — DIPHENHYDRAMINE HCL 50 MG/ML IJ SOLN: 12.5000 mg | Freq: Four times a day (QID) | INTRAMUSCULAR | Status: DC | PRN

## 2019-03-04 MED ORDER — KETAMINE HCL 10 MG/ML IJ SOLN
INTRAMUSCULAR | Status: DC | PRN
  Administered 2019-03-04: 50 mg via INTRAVENOUS

## 2019-03-04 SURGICAL SUPPLY — 108 items
APPLIER CLIP 5 13 M/L LIGAMAX5 (MISCELLANEOUS)
APPLIER CLIP ROT 10 11.4 M/L (STAPLE)
BLADE EXTENDED COATED 6.5IN (ELECTRODE) ×3 IMPLANT
CANNULA REDUC XI 12-8 STAPL (CANNULA) ×1
CANNULA REDUC XI 12-8MM STAPL (CANNULA) ×1
CANNULA REDUCER 12-8 DVNC XI (CANNULA) ×1 IMPLANT
CELLS DAT CNTRL 66122 CELL SVR (MISCELLANEOUS) IMPLANT
CHLORAPREP W/TINT 26 (MISCELLANEOUS) ×3 IMPLANT
CLIP APPLIE 5 13 M/L LIGAMAX5 (MISCELLANEOUS) IMPLANT
CLIP APPLIE ROT 10 11.4 M/L (STAPLE) IMPLANT
CLIP VESOLOCK LG 6/CT PURPLE (CLIP) IMPLANT
CLIP VESOLOCK MED LG 6/CT (CLIP) IMPLANT
COVER SURGICAL LIGHT HANDLE (MISCELLANEOUS) ×6 IMPLANT
COVER TIP SHEARS 8 DVNC (MISCELLANEOUS) ×1 IMPLANT
COVER TIP SHEARS 8MM DA VINCI (MISCELLANEOUS) ×2
COVER WAND RF STERILE (DRAPES) IMPLANT
DECANTER SPIKE VIAL GLASS SM (MISCELLANEOUS) ×3 IMPLANT
DEVICE TROCAR PUNCTURE CLOSURE (ENDOMECHANICALS) IMPLANT
DRAIN CHANNEL 19F RND (DRAIN) ×3 IMPLANT
DRAPE ARM DVNC X/XI (DISPOSABLE) ×4 IMPLANT
DRAPE COLUMN DVNC XI (DISPOSABLE) ×1 IMPLANT
DRAPE DA VINCI XI ARM (DISPOSABLE) ×8
DRAPE DA VINCI XI COLUMN (DISPOSABLE) ×2
DRAPE SURG IRRIG POUCH 19X23 (DRAPES) ×3 IMPLANT
DRSG OPSITE POSTOP 4X10 (GAUZE/BANDAGES/DRESSINGS) IMPLANT
DRSG OPSITE POSTOP 4X6 (GAUZE/BANDAGES/DRESSINGS) ×3 IMPLANT
DRSG OPSITE POSTOP 4X8 (GAUZE/BANDAGES/DRESSINGS) IMPLANT
DRSG TEGADERM 2-3/8X2-3/4 SM (GAUZE/BANDAGES/DRESSINGS) ×3 IMPLANT
DRSG TEGADERM 4X4.75 (GAUZE/BANDAGES/DRESSINGS) ×3 IMPLANT
ELECT PENCIL ROCKER SW 15FT (MISCELLANEOUS) ×3 IMPLANT
ELECT REM PT RETURN 15FT ADLT (MISCELLANEOUS) ×3 IMPLANT
ENDOLOOP SUT PDS II  0 18 (SUTURE)
ENDOLOOP SUT PDS II 0 18 (SUTURE) IMPLANT
EVACUATOR SILICONE 100CC (DRAIN) ×3 IMPLANT
GAUZE SPONGE 2X2 8PLY STRL LF (GAUZE/BANDAGES/DRESSINGS) ×1 IMPLANT
GAUZE SPONGE 4X4 12PLY STRL (GAUZE/BANDAGES/DRESSINGS) IMPLANT
GLOVE ECLIPSE 8.0 STRL XLNG CF (GLOVE) ×15 IMPLANT
GLOVE INDICATOR 8.0 STRL GRN (GLOVE) ×15 IMPLANT
GOWN STRL REUS W/TWL XL LVL3 (GOWN DISPOSABLE) ×15 IMPLANT
GRASPER SUT TROCAR 14GX15 (MISCELLANEOUS) ×3 IMPLANT
HOLDER FOLEY CATH W/STRAP (MISCELLANEOUS) ×3 IMPLANT
IRRIG SUCT STRYKERFLOW 2 WTIP (MISCELLANEOUS)
IRRIGATION SUCT STRKRFLW 2 WTP (MISCELLANEOUS) IMPLANT
KIT PROCEDURE DA VINCI SI (MISCELLANEOUS) ×2
KIT PROCEDURE DVNC SI (MISCELLANEOUS) ×1 IMPLANT
KIT TURNOVER KIT A (KITS) IMPLANT
NEEDLE INSUFFLATION 14GA 120MM (NEEDLE) ×3 IMPLANT
PACK CARDIOVASCULAR III (CUSTOM PROCEDURE TRAY) ×3 IMPLANT
PACK COLON (CUSTOM PROCEDURE TRAY) ×3 IMPLANT
PAD POSITIONING PINK XL (MISCELLANEOUS) ×3 IMPLANT
PENCIL SMOKE EVACUATOR (MISCELLANEOUS) IMPLANT
PORT LAP GEL ALEXIS MED 5-9CM (MISCELLANEOUS) ×3 IMPLANT
PROTECTOR NERVE ULNAR (MISCELLANEOUS) ×6 IMPLANT
RELOAD STAPLER 4.3X60 GRN DVNC (STAPLE) ×1 IMPLANT
RTRCTR WOUND ALEXIS 18CM MED (MISCELLANEOUS)
SCISSORS LAP 5X35 DISP (ENDOMECHANICALS) ×3 IMPLANT
SEAL CANN UNIV 5-8 DVNC XI (MISCELLANEOUS) ×4 IMPLANT
SEAL XI 5MM-8MM UNIVERSAL (MISCELLANEOUS) ×8
SEALER VESSEL DA VINCI XI (MISCELLANEOUS) ×2
SEALER VESSEL EXT DVNC XI (MISCELLANEOUS) ×1 IMPLANT
SLEEVE ADV FIXATION 5X100MM (TROCAR) IMPLANT
SOLUTION ELECTROLUBE (MISCELLANEOUS) ×3 IMPLANT
SPONGE GAUZE 2X2 STER 10/PKG (GAUZE/BANDAGES/DRESSINGS) ×2
STAPLER 45 BLU RELOAD XI (STAPLE) IMPLANT
STAPLER 45 BLUE RELOAD XI (STAPLE)
STAPLER 45 DA VINCI SURE FORM (STAPLE)
STAPLER 45 GREEN RELOAD XI (STAPLE)
STAPLER 45 GRN RELOAD XI (STAPLE) IMPLANT
STAPLER 45 SUREFORM DVNC (STAPLE) IMPLANT
STAPLER 60 DA VINCI SURE FORM (STAPLE) ×2
STAPLER 60 SUREFORM DVNC (STAPLE) ×1 IMPLANT
STAPLER CANNULA SEAL DVNC XI (STAPLE) ×1 IMPLANT
STAPLER CANNULA SEAL XI (STAPLE) ×2
STAPLER ECHELON POWER CIR 31 (STAPLE) ×3 IMPLANT
STAPLER RELOAD 4.3X60 GREEN (STAPLE) ×2
STAPLER RELOAD 4.3X60 GRN DVNC (STAPLE) ×1
STAPLER SHEATH (SHEATH) ×2
STAPLER SHEATH ENDOWRIST DVNC (SHEATH) ×1 IMPLANT
STOPCOCK 4 WAY LG BORE MALE ST (IV SETS) ×6 IMPLANT
SURGILUBE 2OZ TUBE FLIPTOP (MISCELLANEOUS) ×3 IMPLANT
SUT MNCRL AB 4-0 PS2 18 (SUTURE) ×3 IMPLANT
SUT PDS AB 1 CT1 27 (SUTURE) ×6 IMPLANT
SUT PDS AB 1 TP1 96 (SUTURE) IMPLANT
SUT PROLENE 0 CT 2 (SUTURE) IMPLANT
SUT PROLENE 2 0 KS (SUTURE) IMPLANT
SUT PROLENE 2 0 SH DA (SUTURE) IMPLANT
SUT SILK 2 0 (SUTURE)
SUT SILK 2 0 SH CR/8 (SUTURE) IMPLANT
SUT SILK 2-0 18XBRD TIE 12 (SUTURE) IMPLANT
SUT SILK 3 0 (SUTURE) ×2
SUT SILK 3 0 SH CR/8 (SUTURE) ×3 IMPLANT
SUT SILK 3-0 18XBRD TIE 12 (SUTURE) ×1 IMPLANT
SUT V-LOC BARB 180 2/0GR6 GS22 (SUTURE)
SUT VIC AB 3-0 SH 18 (SUTURE) IMPLANT
SUT VIC AB 3-0 SH 27 (SUTURE)
SUT VIC AB 3-0 SH 27XBRD (SUTURE) IMPLANT
SUT VICRYL 0 UR6 27IN ABS (SUTURE) ×3 IMPLANT
SUTURE V-LC BRB 180 2/0GR6GS22 (SUTURE) IMPLANT
SYR 10ML ECCENTRIC (SYRINGE) ×3 IMPLANT
SYS LAPSCP GELPORT 120MM (MISCELLANEOUS)
SYSTEM LAPSCP GELPORT 120MM (MISCELLANEOUS) IMPLANT
TAPE UMBILICAL COTTON 1/8X30 (MISCELLANEOUS) ×3 IMPLANT
TOWEL OR NON WOVEN STRL DISP B (DISPOSABLE) ×3 IMPLANT
TRAY FOLEY MTR SLVR 16FR STAT (SET/KITS/TRAYS/PACK) ×3 IMPLANT
TROCAR ADV FIXATION 5X100MM (TROCAR) ×3 IMPLANT
TUBING CONNECTING 10 (TUBING) ×6 IMPLANT
TUBING CONNECTING 10' (TUBING) ×3
TUBING INSUFFLATION 10FT LAP (TUBING) ×3 IMPLANT

## 2019-03-04 NOTE — Discharge Instructions (Signed)
SURGERY: POST OP INSTRUCTIONS °(Surgery for small bowel obstruction, colon resection, etc) ° ° °###################################################################### ° °EAT °Gradually transition to a high fiber diet with a fiber supplement over the next few days after discharge ° °WALK °Walk an hour a day.  Control your pain to do that.   ° °CONTROL PAIN °Control pain so that you can walk, sleep, tolerate sneezing/coughing, go up/down stairs. ° °HAVE A BOWEL MOVEMENT DAILY °Keep your bowels regular to avoid problems.  OK to try a laxative to override constipation.  OK to use an antidairrheal to slow down diarrhea.  Call if not better after 2 tries ° °CALL IF YOU HAVE PROBLEMS/CONCERNS °Call if you are still struggling despite following these instructions. °Call if you have concerns not answered by these instructions ° °###################################################################### ° ° °DIET °Follow a light diet the first few days at home.  Start with a bland diet such as soups, liquids, starchy foods, low fat foods, etc.  If you feel full, bloated, or constipated, stay on a ful liquid or pureed/blenderized diet for a few days until you feel better and no longer constipated. °Be sure to drink plenty of fluids every day to avoid getting dehydrated (feeling dizzy, not urinating, etc.). °Gradually add a fiber supplement to your diet over the next week.  Gradually get back to a regular solid diet.  Avoid fast food or heavy meals the first week as you are more likely to get nauseated. °It is expected for your digestive tract to need a few months to get back to normal.  It is common for your bowel movements and stools to be irregular.  You will have occasional bloating and cramping that should eventually fade away.  Until you are eating solid food normally, off all pain medications, and back to regular activities; your bowels will not be normal. °Focus on eating a low-fat, high fiber diet the rest of your life  (See Getting to Good Bowel Health, below). ° °CARE of your INCISION or WOUND °It is good for closed incision and even open wounds to be washed every day.  Shower every day.  Short baths are fine.  Wash the incisions and wounds clean with soap & water.    °If you have a closed incision(s), wash the incision with soap & water every day.  You may leave closed incisions open to air if it is dry.   You may cover the incision with clean gauze & replace it after your daily shower for comfort. °If you have skin tapes (Steristrips) or skin glue (Dermabond) on your incision, leave them in place.  They will fall off on their own like a scab.  You may trim any edges that curl up with clean scissors.  If you have staples, set up an appointment for them to be removed in the office in 10 days after surgery.  °If you have a drain, wash around the skin exit site with soap & water and place a new dressing of gauze or band aid around the skin every day.  Keep the drain site clean & dry.    °If you have an open wound with packing, see wound care instructions.  In general, it is encouraged that you remove your dressing and packing, shower with soap & water, and replace your dressing once a day.  Pack the wound with clean gauze moistened with normal (0.9%) saline to keep the wound moist & uninfected.  Pressure on the dressing for 30 minutes will stop most wound   bleeding.  Eventually your body will heal & pull the open wound closed over the next few months.  °Raw open wounds will occasionally bleed or secrete yellow drainage until it heals closed.  Drain sites will drain a little until the drain is removed.  Even closed incisions can have mild bleeding or drainage the first few days until the skin edges scab over & seal.   °If you have an open wound with a wound vac, see wound vac care instructions. ° ° ° ° °ACTIVITIES as tolerated °Start light daily activities --- self-care, walking, climbing stairs-- beginning the day after surgery.   Gradually increase activities as tolerated.  Control your pain to be active.  Stop when you are tired.  Ideally, walk several times a day, eventually an hour a day.   °Most people are back to most day-to-day activities in a few weeks.  It takes 4-8 weeks to get back to unrestricted, intense activity. °If you can walk 30 minutes without difficulty, it is safe to try more intense activity such as jogging, treadmill, bicycling, low-impact aerobics, swimming, etc. °Save the most intensive and strenuous activity for last (Usually 4-8 weeks after surgery) such as sit-ups, heavy lifting, contact sports, etc.  Refrain from any intense heavy lifting or straining until you are off narcotics for pain control.  You will have off days, but things should improve week-by-week. °DO NOT PUSH THROUGH PAIN.  Let pain be your guide: If it hurts to do something, don't do it.  Pain is your body warning you to avoid that activity for another week until the pain goes down. °You may drive when you are no longer taking narcotic prescription pain medication, you can comfortably wear a seatbelt, and you can safely make sudden turns/stops to protect yourself without hesitating due to pain. °You may have sexual intercourse when it is comfortable. If it hurts to do something, stop. ° °MEDICATIONS °Take your usually prescribed home medications unless otherwise directed.   °Blood thinners:  °Usually you can restart any strong blood thinners after the second postoperative day.  It is OK to take aspirin right away.    ° If you are on strong blood thinners (warfarin/Coumadin, Plavix, Xerelto, Eliquis, Pradaxa, etc), discuss with your surgeon, medicine PCP, and/or cardiologist for instructions on when to restart the blood thinner & if blood monitoring is needed (PT/INR blood check, etc).   ° ° °PAIN CONTROL °Pain after surgery or related to activity is often due to strain/injury to muscle, tendon, nerves and/or incisions.  This pain is usually  short-term and will improve in a few months.  °To help speed the process of healing and to get back to regular activity more quickly, DO THE FOLLOWING THINGS TOGETHER: °1. Increase activity gradually.  DO NOT PUSH THROUGH PAIN °2. Use Ice and/or Heat °3. Try Gentle Massage and/or Stretching °4. Take over the counter pain medication °5. Take Narcotic prescription pain medication for more severe pain ° °Good pain control = faster recovery.  It is better to take more medicine to be more active than to stay in bed all day to avoid medications. °1.  Increase activity gradually °Avoid heavy lifting at first, then increase to lifting as tolerated over the next 6 weeks. °Do not “push through” the pain.  Listen to your body and avoid positions and maneuvers than reproduce the pain.  Wait a few days before trying something more intense °Walking an hour a day is encouraged to help your body recover faster   and more safely.  Start slowly and stop when getting sore.  If you can walk 30 minutes without stopping or pain, you can try more intense activity (running, jogging, aerobics, cycling, swimming, treadmill, sex, sports, weightlifting, etc.) °Remember: If it hurts to do it, then don’t do it! °2. Use Ice and/or Heat °You will have swelling and bruising around the incisions.  This will take several weeks to resolve. °Ice packs or heating pads (6-8 times a day, 30-60 minutes at a time) will help sooth soreness & bruising. °Some people prefer to use ice alone, heat alone, or alternate between ice & heat.  Experiment and see what works best for you.  Consider trying ice for the first few days to help decrease swelling and bruising; then, switch to heat to help relax sore spots and speed recovery. °Shower every day.  Short baths are fine.  It feels good!  Keep the incisions and wounds clean with soap & water.   °3. Try Gentle Massage and/or Stretching °Massage at the area of pain many times a day °Stop if you feel pain - do not  overdo it °4. Take over the counter pain medication °This helps the muscle and nerve tissues become less irritable and calm down faster °Choose ONE of the following over-the-counter anti-inflammatory medications: °Acetaminophen 500mg tabs (Tylenol) 1-2 pills with every meal and just before bedtime (avoid if you have liver problems or if you have acetaminophen in you narcotic prescription) °Naproxen 220mg tabs (ex. Aleve, Naprosyn) 1-2 pills twice a day (avoid if you have kidney, stomach, IBD, or bleeding problems) °Ibuprofen 200mg tabs (ex. Advil, Motrin) 3-4 pills with every meal and just before bedtime (avoid if you have kidney, stomach, IBD, or bleeding problems) °Take with food/snack several times a day as directed for at least 2 weeks to help keep pain / soreness down & more manageable. °5. Take Narcotic prescription pain medication for more severe pain °A prescription for strong pain control is often given to you upon discharge (for example: oxycodone/Percocet, hydrocodone/Norco/Vicodin, or tramadol/Ultram) °Take your pain medication as prescribed. °Be mindful that most narcotic prescriptions contain Tylenol (acetaminophen) as well - avoid taking too much Tylenol. °If you are having problems/concerns with the prescription medicine (does not control pain, nausea, vomiting, rash, itching, etc.), please call us [phone] to see if we need to switch you to a different pain medicine that will work better for you and/or control your side effects better. °If you need a refill on your pain medication, you must call the office before 4 pm and on weekdays only.  By federal law, prescriptions for narcotics cannot be called into a pharmacy.  They must be filled out on paper & picked up from our office by the patient or authorized caretaker.  Prescriptions cannot be filled after 4 pm nor on weekends.   ° °WHEN TO CALL US [phone] °Severe uncontrolled or worsening pain  °Fever over 101 F (38.5 C) °Concerns with  the incision: Worsening pain, redness, rash/hives, swelling, bleeding, or drainage °Reactions / problems with new medications (itching, rash, hives, nausea, etc.) °Nausea and/or vomiting °Difficulty urinating °Difficulty breathing °Worsening fatigue, dizziness, lightheadedness, blurred vision °Other concerns °If you are not getting better after two weeks or are noticing you are getting worse, contact our office [phone] for further advice.  We may need to adjust your medications, re-evaluate you in the office, send you to the emergency room, or see what other things we can do to help. °The   clinic staff is available to answer your questions during regular business hours (8:30am-5pm).  Please don’t hesitate to call and ask to speak to one of our nurses for clinical concerns.    °A surgeon from [facility] is always on call at the hospitals 24 hours/day °If you have a medical emergency, go to the nearest emergency room or call 911. ° °FOLLOW UP in our office °One the day of your discharge from the hospital (or the next business weekday), please call [facility] to set up or confirm an appointment to see your surgeon in the office for a follow-up appointment.  Usually it is 2-3 weeks after your surgery.   °If you have skin staples at your incision(s), let the office know so we can set up a time in the office for the nurse to remove them (usually around 10 days after surgery). °Make sure that you call for appointments the day of discharge (or the next business weekday) from the hospital to ensure a convenient appointment time. °IF YOU HAVE DISABILITY OR FAMILY LEAVE FORMS, BRING THEM TO THE OFFICE FOR PROCESSING.  DO NOT GIVE THEM TO YOUR DOCTOR. ° °[facility] °[address], [address], [place],   [address] ? °[phone] - Main °[phone] - Toll Free,  [phone] - Fax °[email or web address] ° °GETTING TO GOOD BOWEL HEALTH. °It is  expected for your digestive tract to need a few months to get back to normal.  It is common for your bowel movements and stools to be irregular.  You will have occasional bloating and cramping that should eventually fade away.  Until you are eating solid food normally, off all pain medications, and back to regular activities; your bowels will not be normal.   °Avoiding constipation °The goal: ONE SOFT BOWEL MOVEMENT A DAY!    °Drink plenty of fluids.  Choose water first. °TAKE A FIBER SUPPLEMENT EVERY DAY THE REST OF YOUR LIFE °During your first week back home, gradually add back a fiber supplement every day °Experiment which form you can tolerate.   There are many forms such as powders, tablets, wafers, gummies, etc °Psyllium bran (Metamucil), methylcellulose (Citrucel), Miralax or Glycolax, Benefiber, Flax Seed.  °Adjust the dose week-by-week (1/2 dose/day to 6 doses a day) until you are moving your bowels 1-2 times a day.  Cut back the dose or try a different fiber product if it is giving you problems such as diarrhea or bloating. °Sometimes a laxative is needed to help jump-start bowels if constipated until the fiber supplement can help regulate your bowels.  If you are tolerating eating & you are farting, it is okay to try a gentle laxative such as double dose MiraLax, prune juice, or Milk of Magnesia.  Avoid using laxatives too often. °Stool softeners can sometimes help counteract the constipating effects of narcotic pain medicines.  It can also cause diarrhea, so avoid using for too long. °If you are still constipated despite taking fiber daily, eating solids, and a few doses of laxatives, call our office. °Controlling diarrhea °Try drinking liquids and eating bland foods for a few days to avoid stressing your intestines further. °Avoid dairy products (especially milk & ice cream) for a short time.  The intestines often can lose the ability to digest lactose when stressed. °Avoid foods that cause gassiness or  bloating.  Typical foods include beans and other legumes, cabbage, broccoli, and dairy foods.  Avoid greasy, spicy, fast foods.  Every person has   some sensitivity to other foods, so listen to your body and avoid those foods that trigger problems for you. °Probiotics (such as active yogurt, Align, etc) may help repopulate the intestines and colon with normal bacteria and calm down a sensitive digestive tract °Adding a fiber supplement gradually can help thicken stools by absorbing excess fluid and retrain the intestines to act more normally.  Slowly increase the dose over a few weeks.  Too much fiber too soon can backfire and cause cramping & bloating. °It is okay to try and slow down diarrhea with a few doses of antidiarrheal medicines.   °Bismuth subsalicylate (ex. Kayopectate, Pepto Bismol) for a few doses can help control diarrhea.  Avoid if pregnant.   °Loperamide (Imodium) can slow down diarrhea.  Start with one tablet (2mg) first.  Avoid if you are having fevers or severe pain.  °ILEOSTOMY PATIENTS WILL HAVE CHRONIC DIARRHEA since their colon is not in use.    °Drink plenty of liquids.  You will need to drink even more glasses of water/liquid a day to avoid getting dehydrated. °Record output from your ileostomy.  Expect to empty the bag every 3-4 hours at first.  Most people with a permanent ileostomy empty their bag 4-6 times at the least.   °Use antidiarrheal medicine (especially Imodium) several times a day to avoid getting dehydrated.  Start with a dose at bedtime & breakfast.  Adjust up or down as needed.  Increase antidiarrheal medications as directed to avoid emptying the bag more than 8 times a day (every 3 hours). °Work with your wound ostomy nurse to learn care for your ostomy.  See ostomy care instructions. °TROUBLESHOOTING IRREGULAR BOWELS °1) Start with a soft & bland diet. No spicy, greasy, or fried foods.  °2) Avoid gluten/wheat or dairy products from diet to see if symptoms improve. °3) Miralax  17gm or flax seed mixed in 8oz. water or juice-daily. May use 2-4 times a day as needed. °4) Gas-X, Phazyme, etc. as needed for gas & bloating.  °5) Prilosec (omeprazole) over-the-counter as needed °6)  Consider probiotics (Align, Activa, etc) to help calm the bowels down ° °Call your doctor if you are getting worse or not getting better.  Sometimes further testing (cultures, endoscopy, X-ray studies, CT scans, bloodwork, etc.) may be needed to help diagnose and treat the cause of the diarrhea. °[facility] °[address], [address], , [place]  [address] °[phone] - Main.    °[phone]  - Toll Free.   [phone] - Fax °[email or web address] ° ° ° °Colorectal Cancer ° °Colorectal cancer is an abnormal growth of cells and tissue (tumor) in the colon or rectum, which are parts of the large intestine. The cancer can spread (metastasize) to other parts of the body. °What are the causes? °Most cases of colorectal cancer start as abnormal growths called polyps on the inner wall of the colon or rectum. Other times, abnormal changes to genes (genetic mutations) can cause cells to form cancer. °What increases the risk? °You are more likely to develop this condition if: °· You are older than age 50. °· You have multiple polyps in the colon or rectum. °· You have diabetes. °· You are African American. °· You have a family history of Lynch syndrome. °· You have had cancer before. °· You have certain hereditary conditions, such as: °? Familial adenomatous polyposis. °? Turcot syndrome. °? Peutz-Jeghers syndrome. °· You eat a diet that is high in fat (especially animal fat)   and low in fiber, fruits, and vegetables. °· You have an inactive (sedentary) lifestyle. °· You have an inflammatory bowel disease or Crohn's disease. °· You smoke. °· You drink alcohol excessively. °What are the signs or symptoms? °Early colorectal cancer often does not cause symptoms. As the cancer grows,  symptoms may include: °· Changes in bowel habits. °· Feeling like the bowel does not empty completely after a bowel movement. °· Stools that are narrower than usual. °· Blood in the stool. °· Diarrhea. °· Constipation. °· Anemia. °· Discomfort, pain, bloating, fullness, or cramps in the abdomen. °· Frequent gas pain. °· Unexplained weight loss. °· Constant fatigue. °· Nausea and vomiting. °How is this diagnosed? °This condition may be diagnosed with: °· A medical history. °· A physical exam. °· Tests. These may include: °? A digital rectal exam. °? A stool test called a fecal occult blood test. °? Blood tests. °? A test in which a tissue sample is taken from the colon or rectum and examined under a microscope (biopsy). °? Imaging tests, such as: °§ X-rays. °§ A barium enema. °§ CT scans. °§ MRIs. °§ A sigmoidoscopy. This test is done to view the inside of the rectum. °§ A colonoscopy. This test is done to view the inside of the colon. During this test, small polyps can be removed or biopsies may be taken. °§ An endorectal ultrasound. This test checks how deep a tumor in the rectum has grown and whether the cancer has spread to lymph nodes or other nearby tissues. °Your health care provider may order additional tests to find out whether the cancer has spread to other parts of the body (what stage it is). The stages of cancer include: °· Stage 0. At this stage, the cancer is found only in the innermost lining of the colon or rectum. °· Stage I. At this stage, the cancer has grown into the inner wall of the colon or rectum. °· Stage II. At this stage, the cancer has gone more deeply into the wall of the colon or rectum or through the wall. It may have invaded nearby tissue. °· Stage III. At this stage, the cancer has spread to nearby lymph nodes. °· Stage IV. At this stage, the cancer has spread to other parts of the body, such as the liver or lungs. °How is this treated? °Treatment for this condition depends on the  type and stage of the cancer. Treatment may include: °· Surgery. In the early stages of the cancer, surgery may be done to remove polyps or small tumors from the colon. In later stages, surgery may be done to remove part of the colon. °· Chemotherapy. This treatment uses medicines to kill cancer cells. °· Targeted therapy. This treatment targets specific gene mutations or proteins that the cancer expresses in order to kill tumor cells. °· Radiation therapy. This treatment uses radiation to kill cancer cells or shrink tumors. °· Radiofrequency ablation. This treatment uses radio waves to destroy the tumors that may have spread to other areas of the body, such as the liver. °Follow these instructions at home: °· Take over-the-counter and prescription medicines only as told by your health care provider. °· Try to eat regular, healthy meals. Some of your treatments might affect your appetite. If you are having problems eating or with your appetite, ask to meet with a food and nutrition specialist (dietitian). °· Consider joining a support group. This may help you learn about your diagnosis and cope with the stress of   having colorectal cancer. °· If you are admitted to the hospital, inform your cancer care team. °· Keep all follow-up visits as told by your health care provider. This is important. °How is this prevented? °· Colorectal cancer can be prevented with screening tests that find polyps so they can be removed before they develop into cancer. °· All adults should have screening for colorectal cancer starting at age 50 and continuing until age 75. Your health care provider may recommend screening at age 45. People at increased risk should start screening at an earlier age. °Where to find more information °· American Cancer Society: https://www.cancer.org °· National Cancer Institute (NCI): https://www.cancer.gov °Contact a health care provider if: °· Your diarrhea or constipation does not go away. °· You have blood  in your stool. °· Your bowel habits change. °· You have increased pain in your abdomen. °· You notice new fatigue or weakness. °· You lose weight. °Get help right away if: °· You have increased bleeding from your rectum. °· You have any uncontrollable or severe abdomen (abdominal) symptoms. °Summary °· Colorectal cancer is an abnormal growth of cells and tissue (tumor) in the colon or rectum. °· Common risk factors for this condition include having a relative with colon cancer, being older in age, having an inflammatory bowel disease, and being African American. °· This condition may be diagnosed with tests, such as a colonoscopy and biopsy. °· Treatment depends on the type and stage of the cancer. Commonly, treatment includes surgery to remove the tumor along with chemotherapy or targeted therapy. °· Keep all follow-up visits as told by your health care provider. This is important. °This information is not intended to replace advice given to you by your health care provider. Make sure you discuss any questions you have with your health care provider. °Document Released: 03/17/2005 Document Revised: 05/07/2017 Document Reviewed: 04/18/2016 °Elsevier Patient Education © 2020 [employer] ° °

## 2019-03-04 NOTE — Anesthesia Procedure Notes (Signed)
Arterial Line Insertion Start/End12/06/2018 7:17 AM, 03/04/2019 7:18 AM Performed by: Brennan Bailey, MD, Glory Buff, CRNA  Patient location: Pre-op. Preanesthetic checklist: patient identified, IV checked, site marked, risks and benefits discussed, surgical consent, monitors and equipment checked, pre-op evaluation, timeout performed and anesthesia consent Lidocaine 1% used for infiltration and patient sedated radial was placed Catheter size: 20 Fr Hand hygiene performed , maximum sterile barriers used  and Seldinger technique used Allen's test indicative of satisfactory collateral circulation Attempts: 1 Procedure performed without using ultrasound guided technique. Following insertion, dressing applied and Biopatch. Post procedure assessment: normal and unchanged  Patient tolerated the procedure well with no immediate complications.

## 2019-03-04 NOTE — Op Note (Signed)
03/04/2019  11:05 AM  PATIENT:  [name]  53 y.o. 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 An experienced assistant was required given the standard of surgical care given the complexity of the case.  This assistant was needed for exposure, dissection, suctioning, retraction, instrument exchange, etc.  ANESTHESIA:     General  Nerve block provided with liposomal bupivacaine (Experel) mixed with 0.25% bupivacaine as a Bilateral TAP block x 23mL each side at the level of the transverse abdominis & preperitoneal spaces along the flank at the anterior axillary line, from subcostal ridge to iliac crest under laparoscopic guidance   Local field block at port sites & extraction wound  EBL:  Total I/O In: 2000 [I.V.:2000] Out: 165 [Urine:115; Blood:50]  Delay start of Pharmacological VTE agent (>24hrs) due to surgical blood loss or risk of bleeding:  no  DRAINS: No  SPECIMEN:   DISTAL "LEFT" COLON (open end proximal) DISTAL ANASTOMOTIC RING (final distal margin) LUQ MASS (PROBABLE ACCESSORY SPLEEN)  DISPOSITION OF SPECIMEN:  PATHOLOGY  COUNTS:  YES  PLAN OF CARE: Admit  to inpatient   PATIENT DISPOSITION:  PACU - hemodynamically stable.  INDICATION:    Pleasant gentleman.  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  Found to have a mass about 75 cm from the anus on colonoscopy.  Biopsy consistent with adenocarcinoma.  No evidence of any metastatic disease.  Has splenomegaly with large central mass most likely consistent with hemangioma that has been stable for several years.  I recommended segmental resection:  The anatomy & physiology of the digestive tract was discussed.  The pathophysiology was discussed.  Natural history risks without surgery was discussed.   I worked to give an overview of the disease and the frequent need to have multispecialty involvement.  I feel the risks of no intervention will lead to serious problems that outweigh the operative risks; therefore, I recommended a partial colectomy to remove the pathology.  Laparoscopic & open techniques were discussed.   Risks such as bleeding, infection, abscess, leak, reoperation, possible ostomy, hernia, heart attack, death, and other risks were discussed.  I noted a good likelihood this will help address the problem.   Goals of post-operative recovery were discussed as well.  We will work to minimize complications.  Educational materials on the pathology had been given in the office.  Questions were answered.    The patient expressed understanding & wished to proceed with surgery.  OR FINDINGS:   Patient had tattooing at the splenic flexure and at the proximal sigmoid colon.  Mass in the mid descending colon.  No definite frank lymphadenopathy or metastatic disease.  Patient had moderate adhesions from his prior laparotomy and radical left nephrectomy.  Primarily wispy omental adhesions.  5 x 4 x 3 cm ellipsoid mass within  the mesentery near the hilum of the spleen consistent with accessory spleen.  Removed.  Obvious enlarged left spleen but smooth appearing  without any frank nodularity metastatic disease.  Left in place.  No obvious metastatic disease on visceral parietal peritoneum or liver.  The anastomosis rests 17 cm from the anal verge by rigid proctoscopy.  It is end mid-transverse to end proximal rectum 31 EEA stapled anastomosis.  DESCRIPTION:   Informed consent was confirmed.  The patient underwent general anaesthesia without difficulty.  The patient was positioned appropriately.  VTE prevention in place.  The patient was clipped, prepped, & draped in a sterile fashion.  Surgical timeout confirmed our plan.  The patient was positioned in reverse Trendelenburg.  Abdominal entry was gained using Varess technique at the right subcostal ridge on the anterior abdominal wall.  Patient had some mild hypotension.  Gas desufflated.  No elevated EtCO2 noted.  Blood pressure improved.  Again recent flatus slowly without any more hypotension.  Port placed.  Camera inspection revealed small needle puncture on the liver.  Mild oozing controlled and stopped spontaneously.  Moderate adhesions noted of greater omentum to the upper midline and left upper quadrant.  Extra ports were carefully placed under direct laparoscopic visualization.  Could see tattooing at the splenic flexure.  This seemed to be consistent where gastroneurology had suspected the tumor was. The patient was carefully positioned.  The [employer] daVinci robot was docked with camera & instruments carefully placed.  I did robotic lysed lesions to free greater omentum off the anterior abdominal wall towards the left upper quadrant.  It was rather thin and wispy.  He had quite redundant stretched out:.  Because of his prior left nephrectomy, his small bowel rested in the left retroperitoneum.  However there were no retroperitoneal adhesions.  Could noted obviously enlarged spleen.  There was an ellipsoid mass near the splenic hilum within the omentum.  Most likely consistent with a accessory spleen.  It  looked whitish and possibly ischemic.  Therefore was transected off the omentum.  It was later removed with the primary specimen.  Good look like this is permanent the splenic flexure and side to mobilize the splenic flexure.  Freed greater omental attachments off the transverse colon towards the splenic flexure.  Got into the lesser sac.  I found a healthy right proximal middle colic pedicle and left That intact.  He had redundant stretched out colon and his transverse colon could easily reach down towards his infraumbilical region already.  Transected the left medical colic arterial pedicle and gradually help free the splenic flexure attachments off the left retroperitoneum.  And then mobilized the mid descending colon a lateral medial fashion and came up more proximally.  Alternated between the descending colon and transverse colon side to eventually freed the splenic flexure off its retroperitoneal attachments.  There were some wispy splenocolic attachments.  Rather vascularized but able to gradually freed these off.  With this mobilization became apparent that the actual mass was in the mid descending colon.  It was not tattooed.  We reposition the patient in more Trendelenburg positioning.  Got the small bowel out of the pelvis and left side of the abdomen.  He had a stretched out redundant sigmoid colon.  Tattooing noted at the descending/sigmoid junction.  Because this seemed more distal initially dissipated, both Dr. Marcello Moores and I agreed he would need high ligation of his inferior mesenteric artery and venous pedicles for adequate lymph node sampling.  We elevated his  very redundant sigmoid colon to help identify the pedicle.  I scored the base of peritoneum of the medial side of the mesentery of the elevated left colon from the neo-ligament of Treitz to the peritoneal reflection of the mid rectum.   I elevated the sigmoid mesentery and entered into the retro-mesenteric plane.  Despite his prior left  nephrectomy we were actually able to identify the distal left ureter and gonadal vessels.  He had very little intra-abdominal or mesenteric or retroperitoneal fat.  Could easily see the right ureter in its natural right retroperitoneal position far away from where we needed to be.  Kept those retroperitoneal structures we kept those posterior within the retroperitoneum and elevated the left colon mesentery off that. I did isolate the inferior mesenteric artery (IMA) pedicle but did not ligate it yet.  I continued distally and got into the avascular plane at the level of the sacral promontory.    I elevated the left colon mesentery off the retroperitoneum and mobilized in a medial lateral fashion as much as possible.  Somewhat of a deep dive since he had already had a left nephrectomy.  His entire left colon mesentery was extremely thinned out but we were to try and preserve it.  I skeletonized the lymph nodes off the inferior mesenteric artery pedicle.  I went down to its takeoff from the aorta.  I isolated the inferior mesenteric vein off of the ligament of Treitz just cephalad to that as well.  After confirming the left ureter was out of the way, I went ahead and ligated the inferior mesenteric artery pedicle just near its takeoff from the aorta.  I did ligate the inferior mesenteric vein in a similar fashion.  Then further dissection to help transect the splenic flexure mesentery.  However was very wispy with little vascular content.  Very little colon mesentery tissue between the distal left middle colic arterial pedicle and the left colic pedicle.  Mobilized the descending colon in a lateral medial fashion also the sigmoid colon.  We ensured hemostasis. I skeletonized the mesorectum at the junction at the proximal rectum for the distal point of resection.  Chose a region of the mid transverse colon that could reach down to the pelvis.  Tissues looked a little purplish but there have been a lot of tattooing  everywhere.  Because of his atypical anatomy and atypical location with wispy tissues, we decided to test perfusion using firefly   We asked anesthesia to dilute the indocyanine green (ICG) to 10 mL and inject 3 mL intravenously with IV flush.  I switched to the NIR fluorescence (Firefly mode) imaging window on the daVinci platform.  I was able to see good light green visualization of blood vessels with good perfusion of tissues to the distal transverse colon.  Also good perfusion of the rectum coming up to the preselected transection point, confirming good tissue perfusion of both ends planned for anastomosis.  I did up transecting some redundant mesentery on the mid transverse colon to the preselected region that easily reached down.  I transected at the proximal rectum using a robotic 60 mm green load stapler.  I chose a region at the descending/sigmoid junction that was soft and easily reached down to the rectal stump.   I created an extraction incision through a small Pfannenstiel incision including the 12 mm stapler port site in the suprapubic region.  Placed a wound protector.  I was able to eviscerate the left colon out the wound.  I clamped the mid transverse colon using a reusable pursestringer device.  Passed a 2-0 Keith needle. I transected at the descending/sigmoid junction with a scalpel. I got healthy bleeding mucosa.  We sent the rectosigmoid colon specimen off to go to pathology.  We sized the colon orifice.  I chose a 31 EEA anvil stapler system.  I reinforced the prolene pursestring with interrupted silk suture.  I placed the anvil to the open end of the proximal remaining colon and closed around it using the pursestring.    Return the colon back in the abdomen.  We induced laparoscopic insufflation.  I mobilized the small bowel out of the left retroperitoneum to the right side such that the transverse colon could reach down along the left retroperitoneum towards the pelvis without tension.   We did copious irrigation with crystalloid solution.  Hemostasis was good.  The distal end of the remaining colon easily reached down to the rectal stump.  Dr Marcello Moores scrubbed down and did gentle anal dilation and advanced the EEA stapler up the rectal stump. The spike was brought out at the provimal end of the rectal stump under direct visualization.  I attached the anvil of the proximal colon the spike of the stapler. Anvil was tightened down and held clamped for 60 seconds. The EEA stapler was fired and held clamped for 30 seconds. The stapler was released & removed. We noted 2 excellent anastomotic rings. Blue stitch is in the proximal ring.  Dr Marcello Moores did rigid proctoscopy noted the anastomosis was at 17 cm from the anal verge consistent with the proximal rectum.  We did a final irrigation of antibiotic solution (900 mg clindamycin/240 mg gentamicin in a liter of crystalloid) & held that for the pelvic air leak test .  The rectum was insufflated the rectum while clamping the colon proximal to that anastomosis.  There was a negative air leak test. There was no tension of mesentery or bowel at the anastomosis.   Tissues looked viable.  Ureters & bowel uninjured.  The anastomosis looked healthy.  Endoluminal gas was evacuated.  Ports & wound protector removed.  We changed gloves & redraped the patient per colon SSI prevention protocol.  We aspirated the antibiotic irrigation.  Hemostasis was good.  Sterile unused instruments were used from this point.  I closed the skin at the port sites using Monocryl stitch and sterile dressing.  I closed the extraction wound using a 0 Vicryl vertical peritoneal closure and a #1 PDS transverse anterior rectal fascial closure like a small Pfannenstiel closure. I closed the skin with some interrupted Monocryl stitches.  I placed sterile dressings.     Patient is being extubated go to recovery room. I had discussed postop care with the patient in detail the office & in the  holding area. Instructions are written. I discussed operative findings, updated the patient's status, discussed probable steps to recovery, and gave postoperative recommendations to the patient's spouse, [name].  Recommendations were made.  Questions were answered.  She expressed understanding & appreciation.    [name], M.D., F.A.C.S. Gastrointestinal and Minimally Invasive Surgery [facility] [address], [place], [place] [address] [phone] Main / Paging

## 2019-03-04 NOTE — Anesthesia Procedure Notes (Signed)
Procedure Name: Intubation Date/Time: 03/04/2019 7:53 AM Performed by: Glory Buff, CRNA Pre-anesthesia Checklist: Patient identified, Emergency Drugs available, Suction available and Patient being monitored Patient Re-evaluated:Patient Re-evaluated prior to induction Oxygen Delivery Method: Circle system utilized Preoxygenation: Pre-oxygenation with 100% oxygen Induction Type: IV induction Ventilation: Mask ventilation without difficulty Laryngoscope Size: Miller and 3 Grade View: Grade I Tube type: Oral Number of attempts: 1 Airway Equipment and Method: Stylet and Oral airway Placement Confirmation: ETT inserted through vocal cords under direct vision,  positive ETCO2 and breath sounds checked- equal and bilateral Secured at: 22 cm Tube secured with: Tape Dental Injury: Teeth and Oropharynx as per pre-operative assessment

## 2019-03-04 NOTE — Interval H&P Note (Signed)
History and Physical Interval Note:  03/04/2019 7:09 AM  [name]  has presented today for surgery, with the diagnosis of TRANSVERSE COLON CANCER, SPLENIC MASS.  The various methods of treatment have been discussed with the patient and family. After consideration of risks, benefits and other options for treatment, the patient has consented to  Procedure(s): XI ROBOT ASSISTED PARTIAL COLECTOMY, POSSIBLE SPLENECTOMY (N/A) as a surgical intervention.  The patient's history has been reviewed, patient examined, no change in status, stable for surgery.  I have reviewed the patient's chart and labs.  Questions were answered to the patient's satisfaction.     [name]

## 2019-03-04 NOTE — Anesthesia Postprocedure Evaluation (Signed)
Anesthesia Post Note  Patient: [name]  Procedure(s) Performed: XI ROBOT ASSISTED LEFT HEMICOLECTOMY, WITH SPLENIC FLEXURE, LYSIS OF ADHESIONS, ACCESSORY SPLENECTOMY, BILATERAL TAP BLOCK, RIGID PROCTOSCOPY (N/A Abdomen)     Patient location during evaluation: PACU Anesthesia Type: General Level of consciousness: awake and alert and oriented Pain management: pain level controlled Vital Signs Assessment: post-procedure vital signs reviewed and stable Respiratory status: spontaneous breathing, nonlabored ventilation and respiratory function stable Cardiovascular status: blood pressure returned to baseline Postop Assessment: no apparent nausea or vomiting Anesthetic complications: no    Last Vitals:  Vitals:   03/04/19 1145 03/04/19 1200  BP: (!) 150/93 (!) 147/90  Pulse: 92 91  Resp: 20 15  Temp:  36.9 C  SpO2: 98% 97%    Last Pain:  Vitals:   03/04/19 1200  TempSrc:   PainSc: [place] E Aylen [place]

## 2019-03-04 NOTE — Transfer of Care (Signed)
Immediate Anesthesia Transfer of Care Note  Patient: [name]  Procedure(s) Performed: XI ROBOT ASSISTED LEFT HEMICOLECTOMY, WITH SPLENIC FLEXURE, LYSIS OF ADHESIONS, ACCESSORY SPLENECTOMY, BILATERAL TAP BLOCK, RIGID PROCTOSCOPY (N/A Abdomen)  Patient Location: PACU  Anesthesia Type:General  Level of Consciousness: drowsy, patient cooperative and responds to stimulation  Airway & Oxygen Therapy: Patient Spontanous Breathing and Patient connected to face mask oxygen  Post-op Assessment: Report given to RN and Post -op Vital signs reviewed and stable  Post vital signs: Reviewed and stable  Last Vitals:  Vitals Value Taken Time  BP 153/84 03/04/19 1115  Temp    Pulse 96 03/04/19 1117  Resp 18 03/04/19 1117  SpO2 100 % 03/04/19 1117  Vitals shown include unvalidated device data.  Last Pain:  Vitals:   03/04/19 0601  TempSrc: Oral  PainSc:       Patients Stated Pain Goal: 4 ([id number])  Complications: No apparent anesthesia complications

## 2019-03-04 NOTE — H&P (Signed)
[name]  DOB: 21-Jan-1966 Married / Language: [name] / Race: White Male   ` ` Patient sent for surgical consultation at the request of Dr Michail Sermon  Chief Complaint: Newly diagnosed colon cancer. ` ` The patient is a pleasant male who underwent his first screening colonoscopy at age 53. A few small polyps removed. Bulky mass noted in his transverse colon about 80 cm from the anus. Highly suspicious for cancer. Surgical consultation requested. We fit him in the following week. Patient comes in today by himself. 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 Has had some secondary scoliosis as a result. 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 No cancer. His history of subcutaneous masses consistent with lipomas as well. He is a diabetic. Hemoglobin A1c around 7-8. Not needing any insulin. Question history of some heart valve issues but nothing too severe. On aspirin. Followed by Dr. Irish Lack in the past. Patient exercises at the gym. He can walk 5 miles without difficulty. He does not smoke. Usually moves his bowels twice a day. No family history of bowel issues that he is aware of.  No personal nor family history of inflammatory bowel disease, irritable bowel syndrome, allergy such as Celiac Sprue, dietary/dairy problems, colitis, ulcers nor gastritis. No recent sick contacts/gastroenteritis. No travel outside the country. No changes in diet. No dysphagia to solids or liquids. No significant heartburn or reflux. No hematochezia, hematemesis, coffee ground emesis. No evidence of prior gastric/peptic ulceration.  (Review of systems as stated in this history (HPI) or in the review of systems. Otherwise all other 12 point ROS are negative) ` ` `   DIAGNOSIS:  A. 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  COMMENT:   Immunohistochemistry for CDX-2 is positive. Synaptophysin, Chromogranin and CD56 are negative. Mismatch repair proteins will be reported separately. Results reported to [facility] in the Office of Dr. Wilford Corner on 01/11/2019. Intradepartmental consultation (Dr. Saralyn Pilar).  Lyndee Herbst DESCRIPTION:  Received in formalin are tan, soft tissue fragments that are submitted in toto. Number: MP Size: 0.2-0.4 cm Blocks: 1    Final Diagnosis performed by Gillie Manners, MD. Electronically signed 01/11/2019 Technical component performed at [facility], [place] [address], [place], [place] [address]. Professional component performed at [employer]. [facility], [place] [address], [place], [place] [address]. Immunohistochemistry Technical component (if applicable) was performed at [facility]. [address], [place], [place], [place] [address]. IMMUNOHISTOCHEMISTRY DISCLAIMER (if applicable): Some of these immunohistochemical stains may have been developed and the performance characteristics determine by [facility]. Some may not have been cleared or approved by the U.S. Food and Drug Administration. The FDA has determined that such clearance or approval is not necessary. This test is used for clinical purposes. It should not be regarded as investigational or for research. This laboratory is certified under the [place] (CLIA-88) as qualified to perform high complexity clinical laboratory testing. The controls stained appropriately.   Past Surgical History Sallyanne Kuster, CMA; 01/11/2019 12:35 PM) Knee Surgery Left. Laparoscopic Inguinal Hernia Surgery Left. Nephrectomy Left. Open Inguinal Hernia Surgery Left. Thyroid Surgery  Diagnostic Studies History Sallyanne Kuster, [place]; 01/11/2019 12:35 PM) Colonoscopy within  last year never  Allergies Sallyanne Kuster, [place]; 01/11/2019 12:36 PM) No Known Drug Allergies [02/15/2016]: Allergies Reconciled  Medication History Sallyanne Kuster, [place];  01/11/2019 12:41 PM) Tums (500MG Tablet Chewable, Oral) Active. Multivitamins/Minerals (Oral) Active. Fish Oil (1000MG Capsule, Oral) Active. Atorvastatin Calcium (10MG Tablet, Oral) Active. Lisinopril (2.5MG Tablet, Oral) Active. MetFORMIN HCl (1000MG Tablet, Oral) Active. Asmanex 30 Metered Doses (220MCG/INH Aero Pow Br Act, Inhalation) Active. Repaglinide (1MG Tablet, Oral) Active. Toprol XL (25MG Tablet ER 24HR, Oral) Active. ProAir Digihaler (108MCG/ACT Aero Pow Br Act, Inhalation) Active. ProAir HFA (108 (90 Base)MCG/ACT Aerosol Soln, Inhalation) Active. Aspirin (81MG Tablet, Oral) Active. Medications Reconciled  Social History Sallyanne Kuster, [place]; 01/11/2019 12:35 PM) Alcohol use Occasional alcohol use. Caffeine use Carbonated beverages, Coffee, Tea. No drug use Tobacco use Never smoker.  Family History Sallyanne Kuster, [place]; 01/11/2019 12:35 PM) Heart Disease Father. Hypertension Father. Thyroid problems Mother.  Other Problems Sallyanne Kuster, CMA; 01/11/2019 12:35 PM) Asthma Cancer Diabetes Mellitus Gastric Ulcer High blood pressure Inguinal Hernia Other disease, cancer, significant illness     Review of Systems Sallyanne Kuster CMA; 01/11/2019 12:35 PM) General Not Present- Appetite Loss, Chills, Fatigue, Fever, Night Sweats, Weight Gain and Weight Loss. Skin Not Present- Change in Wart/Mole, Dryness, Hives, Jaundice, New Lesions, Non-Healing Wounds, Rash and Ulcer. HEENT Present- Seasonal Allergies and Wears glasses/contact lenses. Not Present- Earache, Hearing Loss, Hoarseness, Nose Bleed, Oral Ulcers, Ringing in the Ears, Sinus Pain, Sore Throat, Visual Disturbances and Yellow Eyes. Respiratory Present- Snoring. Not Present- Bloody sputum, Chronic Cough, Difficulty  Breathing and Wheezing. Breast Not Present- Breast Mass, Breast Pain, Nipple Discharge and Skin Changes. Cardiovascular Not Present- Chest Pain, Difficulty Breathing Lying Down, Leg Cramps, Palpitations, Rapid Heart Rate, Shortness of Breath and Swelling of Extremities. Gastrointestinal Not Present- Abdominal Pain, Bloating, Bloody Stool, Change in Bowel Habits, Chronic diarrhea, Constipation, Difficulty Swallowing, Excessive gas, Gets full quickly at meals, Hemorrhoids, Indigestion, Nausea, Rectal Pain and Vomiting. Male Genitourinary Not Present- Blood in Urine, Change in Urinary Stream, Frequency, Impotence, Nocturia, Painful Urination, Urgency and Urine Leakage. Musculoskeletal Present- Joint Pain and Joint Stiffness. Not Present- Back Pain, Muscle Pain, Muscle Weakness and Swelling of Extremities. Neurological Not Present- Decreased Memory, Fainting, Headaches, Numbness, Seizures, Tingling, Tremor, Trouble walking and Weakness. Psychiatric Not Present- Anxiety, Bipolar, Change in Sleep Pattern, Depression, Fearful and Frequent crying. Endocrine Not Present- Cold Intolerance, Excessive Hunger, Hair Changes, Heat Intolerance, Hot flashes and New Diabetes. Hematology Not Present- Blood Thinners, Easy Bruising, Excessive bleeding, Gland problems, HIV and Persistent Infections.  Vitals Sallyanne Kuster CMA; 01/11/2019 12:42 PM) 01/11/2019 12:41 PM Weight: 207 lb Height: 72in Body Surface Area: 2.16 m Body Mass Index: 28.07 kg/m  Temp.: 97.4F  Pulse: 93 (Regular)  BP: 150/100 (Sitting, Left Arm, Standard)   BP (!) 148/104   Pulse 96   Temp 98.5 F (36.9 C) (Oral)   Resp 18   Ht 6' (1.829 m)   Wt 92.5 kg   SpO2 96%   BMI 27.67 kg/m       Physical Exam [name] MD; 01/11/2019 1:46 PM)  General Mental Status-Alert. General Appearance-Not in acute distress, Not Sickly. Orientation-Oriented X3. Hydration-Well hydrated. Voice-Normal.   Integumentary Global Assessment Upon inspection and palpation of skin surfaces of the - Axillae: non-tender, no inflammation or ulceration, no drainage. and Distribution of scalp and body hair is normal. General Characteristics Temperature - normal warmth is noted.  Head and Neck Head-normocephalic, atraumatic with no lesions or palpable masses. Face Global Assessment - atraumatic, no absence of expression. Neck Global Assessment - no abnormal movements, no bruit auscultated on the right, no bruit auscultated on the left, no decreased  range of motion, non-tender. Trachea-midline. Thyroid Gland Characteristics - non-tender.  Eye Eyeball - Left-Extraocular movements intact, No Nystagmus - Left. Eyeball - Right-Extraocular movements intact, No Nystagmus - Right. Cornea - Left-No Hazy - Left. Cornea - Right-No Hazy - Right. Sclera/Conjunctiva - Left-No scleral icterus, No Discharge - Left. Sclera/Conjunctiva - Right-No scleral icterus, No Discharge - Right. Pupil - Left-Direct reaction to light normal. Pupil - Right-Direct reaction to light normal.  ENMT Ears Pinna - Left - no drainage observed, no generalized tenderness observed. Pinna - Right - no drainage observed, no generalized tenderness observed. Nose and Sinuses External Inspection of the Nose - no destructive lesion observed. Inspection of the nares - Left - quiet respiration. Inspection of the nares - Right - quiet respiration. Mouth and Throat Lips - Upper Lip - no fissures observed, no pallor noted. Lower Lip - no fissures observed, no pallor noted. Nasopharynx - no discharge present. Oral Cavity/Oropharynx - Tongue - no dryness observed. Oral Mucosa - no cyanosis observed. Hypopharynx - no evidence of airway distress observed.  Chest and Lung Exam Inspection Movements - Normal and Symmetrical. Accessory muscles - No use of accessory muscles in breathing. Palpation Palpation of the chest reveals  - Non-tender. Auscultation Breath sounds - Normal and Clear.  Cardiovascular Auscultation Rhythm - Regular. Murmurs & Other Heart Sounds - Auscultation of the heart reveals - No Murmurs and No Systolic Clicks.  Abdomen Inspection Inspection of the abdomen reveals - No Visible peristalsis and No Abnormal pulsations. Umbilicus - No Bleeding, No Urine drainage. Palpation/Percussion Palpation and Percussion of the abdomen reveal - Soft, Non Tender, No Rebound tenderness, No Rigidity (guarding) and No Cutaneous hyperesthesia. Note: Abdomen with some asymmetry and some tenderness on the left side. Long transverse incision during flank consistent with his prior open left nephrectomy. Numerous subcutaneous case ellipsoid nodules consistent with numerous lipomas. 7 mm periumbilical hernia noticed with cough only. No obvious recurrent inguinal hernias or other abnormalities. Soft. Nontender. Not distended. No umbilical or incisional hernias. No guarding.  Male Genitourinary Sexual Maturity Tanner 5 - Adult hair pattern and Adult penile size and shape. Note: No recurrent hernias. Normal external male genitalia  Peripheral Vascular Upper Extremity Inspection - Left - No Cyanotic nailbeds - Left, Not Ischemic. Inspection - Right - No Cyanotic nailbeds - Right, Not Ischemic.  Neurologic Neurologic evaluation reveals -normal attention span and ability to concentrate, able to name objects and repeat phrases. Appropriate fund of knowledge , normal sensation and normal coordination. Mental Status Affect - not angry, not paranoid. Cranial Nerves-Normal Bilaterally. Gait-Normal.  Neuropsychiatric Mental status exam performed with findings of-able to articulate well with normal speech/language, rate, volume and coherence, thought content normal with ability to perform basic computations and apply abstract reasoning and no evidence of hallucinations, delusions, obsessions or  homicidal/suicidal ideation.  Musculoskeletal Global Assessment Spine, Ribs and Pelvis - no instability, subluxation or laxity. Right Upper Extremity - no instability, subluxation or laxity.  Lymphatic Head & Neck  General Head & Neck Lymphatics: Bilateral - Description - No Localized lymphadenopathy. Axillary  General Axillary Region: Bilateral - Description - No Localized lymphadenopathy. Femoral & Inguinal  Generalized Femoral & Inguinal Lymphatics: Left - Description - No Localized lymphadenopathy. Right - Description - No Localized lymphadenopathy.    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 Biopsy consistent with adenocarcinoma.  Standard of care is segmental colonic resection. Despite  his history of open Wilms tumor surgery, he is a reasonable candidate for a minimally invasive approach. Ideally robotic resection with intracorporeal anastomosis.  Enema to help delineate where exactly this lesion is. If it is more descending colon, then plan segmental resection of the splenic flexure. Need for splenectomy increased given prior Wilms tumor resection. If more proximal or mid transverse colon, then plan extended right hemicolectomy.  Patient has a very enlarged spleen with some type of central lesion. 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 We'll hold off on trying to consider resection unless it is intimately involved with the tumor.  Patient has numerous subcutaneous masses consistent with lipomas. Not a major issue now. Probably very small periumbilical hernia. Would hold off on any intervention since is not seen to be a major issue at this time.  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  The anatomy & physiology of the digestive tract was discussed. The pathophysiology of the colon was discussed. Natural history risks without surgery was discussed. I feel the risks of no intervention will lead to serious problems that outweigh the operative risks; therefore, I recommended a partial colectomy to remove the pathology. Minimally invasive (Robotic/Laparoscopic) & open techniques were discussed.  Risks such as bleeding, infection, abscess, leak, reoperation, possible ostomy, hernia, heart attack, death, and other risks were discussed. I noted a good likelihood this will help address the problem. Goals of post-operative recovery were discussed as well. Need for adequate nutrition, daily bowel regimen and healthy physical activity, to optimize recovery was noted as well. We will work to minimize complications. Educational materials were available as well. Questions were answered. The patient expresses understanding & wishes to proceed with surgery.  Pt Education - [facility] Colon Bowel Prep 2018 ERAS/Miralax/Antibiotics Started Neomycin Sulfate 500 MG Oral Tablet, 2 (two) Tablet SEE NOTE, #6, 01/11/2019, No Refill. Local Order: Pharmacist Notes: TAKE TWO TABLETS AT 2 PM, 3 PM, AND 10 PM THE DAY PRIOR TO SURGERY Started Flagyl 500 MG Oral Tablet, 2 (two) Tablet SEE NOTE, #6, 01/11/2019, No Refill. Local Order: Pharmacist Notes: Take at 2pm, 3pm, and 10pm the day prior to your colon operation Pt Education - Pamphlet Given - Laparoscopic Colorectal Surgery: discussed with patient and provided information. Pt Education - [facility] Colectomy post-op instructions: discussed with patient and provided information.  SPLENIC MASS (R16.1) Impression: Large splenic mass. Seen before partially on prior MRI lumbar spine. Radiology favors hemangioma. They  require splenectomy as well if highly suspicious. Seems unlikely. We will see.   INCISIONAL HERNIA, WITHOUT OBSTRUCTION OR GANGRENE (K43.2) Impression: Very small hernia at bellybutton most likely incisional from prior repair done. I would hold off on any aggressive intervention at this time. Perhaps primary repair at the time of surgery if needed   [place] (D17.1) Impression: Numerous lipomas of anterior abdominal wall. No major changes as far as he can tell. Nothing particularly suspicious on CT scan. Hold off on removal since they're quite numerous.  [name], MD, FACS, MASCRS Gastrointestinal and Minimally Invasive Surgery    [address] [place], [place] [address] [phone] Main / Paging [phone] Fax

## 2019-03-05 LAB — BASIC METABOLIC PANEL
Anion gap: 10 (ref 5–15)
BUN: 14 mg/dL (ref 6–20)
CO2: 25 mmol/L (ref 22–32)
Calcium: 8.5 mg/dL — ABNORMAL LOW (ref 8.9–10.3)
Chloride: 106 mmol/L (ref 98–111)
Creatinine, Ser: 1.06 mg/dL (ref 0.61–1.24)
GFR calc Af Amer: >60 mL/min (ref >60)
GFR calc non Af Amer: >60 mL/min (ref >60)
Glucose, Bld: 135 mg/dL — ABNORMAL HIGH (ref 70–99)
Potassium: 4.1 mmol/L (ref 3.5–5.1)
Sodium: 141 mmol/L (ref 135–145)

## 2019-03-05 LAB — GLUCOSE, CAPILLARY
Glucose-Capillary: 129 mg/dL — ABNORMAL HIGH (ref 70–99)
Glucose-Capillary: 119 mg/dL — ABNORMAL HIGH (ref 70–99)
Glucose-Capillary: 114 mg/dL — ABNORMAL HIGH (ref 70–99)
Glucose-Capillary: 156 mg/dL — ABNORMAL HIGH (ref 70–99)

## 2019-03-05 LAB — CBC
HCT: 39.6 % (ref 39.0–52.0)
Hemoglobin: 13.3 g/dL (ref 13.0–17.0)
MCH: 31.9 pg (ref 26.0–34.0)
MCHC: 33.6 g/dL (ref 30.0–36.0)
MCV: 95 fL (ref 80.0–100.0)
Platelets: 129 10*3/uL — ABNORMAL LOW (ref 150–400)
RBC: 4.17 MIL/uL — ABNORMAL LOW (ref 4.22–5.81)
RDW: 12 % (ref 11.5–15.5)
WBC: 8 10*3/uL (ref 4.0–10.5)
nRBC: 0 % (ref 0.0–0.2)

## 2019-03-05 LAB — MAGNESIUM: Magnesium: 1.6 mg/dL — ABNORMAL LOW (ref 1.7–2.4)

## 2019-03-05 NOTE — Plan of Care (Signed)
Patient sitting up in chair this morning; pain controlled. No needs expressed. Will continue to monitor.

## 2019-03-05 NOTE — Progress Notes (Signed)
1 Day Post-Op   Subjective/Chief Complaint: Doing well No complaints Denies nausea but has a little bloating Had BM   Objective: Vital signs in last 24 hours: Temp:  [98.1 F (36.7 C)-99.6 F (37.6 C)] 98.2 F (36.8 C) (12/05 0554) Pulse Rate:  [68-99] 68 (12/05 0554) Resp:  [14-20] 18 (12/05 0554) BP: (110-157)/(59-108) 128/74 (12/05 0554) SpO2:  [94 %-100 %] 98 % (12/05 0554) Arterial Line BP: (154-161)/(76-77) 161/76 (12/04 1145) Weight:  [88.9 kg] 88.9 kg (12/05 0500)    Intake/Output from previous day: 12/04 0701 - 12/05 0700 In: 3762.7 [I.V.:3562.7; IV Piggyback:200] Out: [id number] [Urine:2990; Blood:50] Intake/Output this shift: No intake/output data recorded.  Exam: Up in chair, awake and alert Looks good Abdomen minimally full, minimally tender  Lab Results:  Recent Labs    03/02/19 0844 03/05/19 0304  WBC 5.8 8.0  HGB 16.1 13.3  HCT 47.3 39.6  PLT 153 129*   BMET Recent Labs    03/02/19 0844 03/05/19 0304  NA 137 141  K 4.7 4.1  CL 102 106  CO2 25 25  GLUCOSE 148* 135*  BUN 18 14  CREATININE 1.06 1.06  CALCIUM 9.4 8.5*   PT/INR No results for input(s): LABPROT, INR in the last 72 hours. ABG No results for input(s): PHART, HCO3 in the last 72 hours.  Invalid input(s): PCO2, PO2  Studies/Results: No results found.  Anti-infectives: Anti-infectives (From admission, onward)   Start     Dose/Rate Route Frequency Ordered Stop   03/04/19 2000  cefoTEtan (CEFOTAN) 2 g in sodium chloride 0.9 % 100 mL IVPB     2 g 200 mL/hr over 30 Minutes Intravenous Every 12 hours 03/04/19 1300 03/04/19 2311   03/04/19 1400  neomycin (MYCIFRADIN) tablet 1,000 mg  Status:  Discontinued     1,000 mg Oral 3 times per day 03/04/19 0527 03/04/19 0528   03/04/19 1400  metroNIDAZOLE (FLAGYL) tablet 1,000 mg  Status:  Discontinued     1,000 mg Oral 3 times per day 03/04/19 0527 03/04/19 0528   03/04/19 1056  clindamycin (CLEOCIN) 900 mg, gentamicin (GARAMYCIN) 240  mg in sodium chloride 0.9 % 1,000 mL for intraperitoneal lavage  Status:  Discontinued       As needed 03/04/19 1057 03/04/19 1246   03/04/19 0600  clindamycin (CLEOCIN) 900 mg, gentamicin (GARAMYCIN) 240 mg in sodium chloride 0.9 % 1,000 mL for intraperitoneal lavage  Status:  Discontinued      Irrigation To Surgery 03/03/19 0752 03/04/19 1246   03/04/19 0600  cefoTEtan (CEFOTAN) 2 g in sodium chloride 0.9 % 100 mL IVPB     2 g 200 mL/hr over 30 Minutes Intravenous On call to O.R. 03/04/19 0527 03/04/19 0818   03/04/19 0530  ampicillin (OMNIPEN) 2 g in sodium chloride 0.9 % 100 mL IVPB  Status:  Discontinued     2 g 300 mL/hr over 20 Minutes Intravenous  Once 03/04/19 0526 03/04/19 0701      Assessment/Plan: s/p Procedure(s): XI ROBOT ASSISTED LEFT HEMICOLECTOMY, WITH SPLENIC FLEXURE, LYSIS OF ADHESIONS, ACCESSORY SPLENECTOMY, BILATERAL TAP BLOCK, RIGID PROCTOSCOPY (N/A)  Doing well post op Ambulate Hopefully home tomorrow  LOS: 1 day    Coralie Keens MD 03/05/2019

## 2019-03-06 LAB — GLUCOSE, CAPILLARY: Glucose-Capillary: 142 mg/dL — ABNORMAL HIGH (ref 70–99)

## 2019-03-06 NOTE — Plan of Care (Signed)
Reviewed discharge instructions with patient; copy given. IV x2 removed. Patient ready for discharge.

## 2019-03-06 NOTE — Discharge Summary (Signed)
Physician Discharge Summary  Patient ID: [name] MRN: [id number] DOB/AGE: Jun 15, 1965 53 y.o.  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  Quick return of bowel function.  Tolerated po well.  Discharged home POD#2  Consults: None  Significant Diagnostic Studies:   Treatments: surgery: robot left hemicolectomy  Discharge Exam: Blood pressure (!) 133/92, pulse 63, temperature 97.9 F (36.6 C), temperature source Oral, resp. rate 18, height 6' (1.829 m), weight 91.3 kg, SpO2 99 %. General appearance: alert, cooperative and no distress Resp: clear to auscultation bilaterally Cardio: regular rate and rhythm, S1, S2 normal, no murmur, click, rub or gallop Incision/Wound:abdomen soft, incisions clean  Disposition: Discharge disposition: 01-Home or Self Care       Discharge Instructions    Call MD for:   Complete by: As directed    FEVER > 101.5 F  (temperatures < 101.5 F are not significant)   Call MD for:  extreme fatigue   Complete by: As directed    Call MD for:  persistant dizziness or light-headedness   Complete by: As directed    Call MD for:  persistant nausea and vomiting   Complete by: As directed    Call MD for:  redness, tenderness, or signs of infection (pain, swelling, redness, odor or green/yellow discharge around incision site)   Complete by: As directed    Call MD for:  severe uncontrolled pain   Complete by: As directed    Diet - low sodium heart healthy   Complete by: As directed    Start with a bland diet such as soups, liquids, starchy  foods, low fat foods, etc. the first few days at home. Gradually advance to a solid, low-fat, high fiber diet by the end of the first week at home.   Add a fiber supplement to your diet (Metamucil, etc) If you feel full, bloated, or constipated, stay on a full liquid or pureed/blenderized diet for a few days until you feel better and are no longer constipated.   Discharge instructions   Complete by: As directed    See Discharge Instructions If you are not getting better after two weeks or are noticing you are getting worse, contact our office [phone] for further advice.  We may need to adjust your medications, re-evaluate you in the office, send you to the emergency room, or see what other things we can do to help. The clinic staff is available to answer your questions during regular business hours (8:30am-5pm).  Please don't hesitate to call and ask to speak to one of our nurses for clinical concerns.    A surgeon from [facility] is always on call at the hospitals 24 hours/day If you have a medical emergency, go to the nearest emergency room or call 911.   Discharge wound care:   Complete by: As directed    It is good for closed incisions and even open wounds to be washed every day.  Shower every day.  Short baths are fine.  Wash the incisions and wounds clean with soap & water.    You may leave closed incisions open  to air if it is dry.   You may cover the incision with clean gauze & replace it after your daily shower for comfort.   Driving Restrictions   Complete by: As directed    You may drive when: - you are no longer taking narcotic prescription pain medication - you can comfortably wear a seatbelt - you can safely make sudden turns/stops without pain.   Increase activity slowly   Complete by: As directed    Start light daily activities --- self-care, walking, climbing stairs- beginning the day after surgery.  Gradually increase activities as tolerated.  Control  your pain to be active.  Stop when you are tired.  Ideally, walk several times a day, eventually an hour a day.   Most people are back to most day-to-day activities in a few weeks.  It takes 4-6 weeks to get back to unrestricted, intense activity. If you can walk 30 minutes without difficulty, it is safe to try more intense activity such as jogging, treadmill, bicycling, low-impact aerobics, swimming, etc. Save the most intensive and strenuous activity for last (Usually 4-8 weeks after surgery) such as sit-ups, heavy lifting, contact sports, etc.  Refrain from any intense heavy lifting or straining until you are off narcotics for pain control.  You will have off days, but things should improve week-by-week. DO NOT PUSH THROUGH PAIN.  Let pain be your guide: If it hurts to do something, don't do it.   Lifting restrictions   Complete by: As directed    If you can walk 30 minutes without difficulty, it is safe to try more intense activity such as jogging, treadmill, bicycling, low-impact aerobics, swimming, etc. Save the most intensive and strenuous activity for last (Usually 4-8 weeks after surgery) such as sit-ups, heavy lifting, contact sports, etc.   Refrain from any intense heavy lifting or straining until you are off narcotics for pain control.  You will have off days, but things should improve week-by-week. DO NOT PUSH THROUGH PAIN.  Let pain be your guide: If it hurts to do something, don't do it.  Pain is your body warning you to avoid that activity for another week until the pain goes down.   May shower / Bathe   Complete by: As directed    May walk up steps   Complete by: As directed    Remove dressing in 72 hours   Complete by: As directed    Make sure all dressings are removed by the third day after surgery.  Leave incisions open to air.  OK to cover incisions with gauze or bandages as desired   Sexual Activity Restrictions   Complete by: As directed    You may have sexual intercourse  when it is comfortable. If it hurts to do something, stop.     Allergies as of 03/06/2019      Reactions   No Known Allergies       Medication List    TAKE these medications   albuterol 108 (90 Base) MCG/ACT inhaler Commonly known as: VENTOLIN HFA Inhale 1 puff into the lungs every 6 (six) hours as needed for wheezing or shortness of breath.   aspirin EC 81 MG tablet Take 81 mg by mouth every evening.   atorvastatin 10 MG tablet Commonly known as: LIPITOR Take 1 tablet (10 mg total) by mouth daily.   calcium carbonate 500 MG chewable tablet Commonly known as: TUMS - dosed in mg elemental calcium Chew 1 tablet by mouth daily  as needed for indigestion or heartburn.   Fish Oil 1000 MG Caps Take 1,000 mg by mouth daily.   lisinopril 2.5 MG tablet Commonly known as: ZESTRIL Take 1 tablet (2.5 mg total) by mouth daily.   metFORMIN 1000 MG tablet Commonly known as: GLUCOPHAGE Take 1,000 mg by mouth 2 (two) times daily.   mometasone 220 MCG/INH inhaler Commonly known as: ASMANEX Inhale 1 puff into the lungs daily.   multivitamin with minerals Tabs tablet Take 1 tablet by mouth daily.   ONE TOUCH ULTRA TEST test strip Generic drug: glucose blood 1 each by Other route as needed (GLUCOSE).   repaglinide 1 MG tablet Commonly known as: PRANDIN Take 1 mg by mouth 2 (two) times daily before a meal.   Toprol XL 25 MG 24 hr tablet Generic drug: metoprolol succinate Take 25 mg by mouth daily.   traMADol 50 MG tablet Commonly known as: ULTRAM Take 1-2 tablets (50-100 mg total) by mouth every 6 (six) hours as needed for moderate pain or severe pain.            Discharge Care Instructions  (From admission, onward)         Start     Ordered   03/04/19 0000  Discharge wound care:    Comments: It is good for closed incisions and even open wounds to be washed every day.  Shower every day.  Short baths are fine.  Wash the incisions and wounds clean with soap & water.     You may leave closed incisions open to air if it is dry.   You may cover the incision with clean gauze & replace it after your daily shower for comfort.   03/04/19 0816         Follow-up Information    Michael Boston, MD. Schedule an appointment as soon as possible for a visit in 3 weeks.   Specialty: General Surgery Why: To follow up after your operation, To follow up after your hospital stay Contact information: [address] [place] [address] [phone]           Signed: Coralie Keens 03/06/2019, 7:54 AM

## 2019-03-06 NOTE — Plan of Care (Signed)
Patient ambulating in hall this morning; hopeful for discharge today. Pain controlled; no needs expressed. Will continue to monitor.

## 2019-03-06 NOTE — Progress Notes (Signed)
Patient ID: [name], male   DOB: Jul 27, 1965, 53 y.o.   MRN: [id number]   Doing well Looks great Tolerating po Having BM's Abdomen soft, dressings dry  Plan: Discharge home

## 2019-03-08 ENCOUNTER — Other Ambulatory Visit: Payer: Self-pay

## 2019-03-09 LAB — SURGICAL PATHOLOGY

## 2019-03-10 ENCOUNTER — Ambulatory Visit: Payer: BC Managed Care – PPO | Admitting: Interventional Cardiology

## 2019-03-10 ENCOUNTER — Telehealth: Payer: Self-pay | Admitting: Oncology

## 2019-03-10 ENCOUNTER — Inpatient Hospital Stay: Payer: BC Managed Care – PPO | Attending: Genetic Counselor | Admitting: Oncology

## 2019-03-10 ENCOUNTER — Other Ambulatory Visit: Payer: Self-pay

## 2019-03-10 VITALS — BP 123/82 | HR 77 | Temp 98.0°F | Resp 20 | Ht 72.0 in | Wt 196.2 lb

## 2019-03-10 DIAGNOSIS — C184 Malignant neoplasm of transverse colon: Secondary | ICD-10-CM | POA: Insufficient documentation

## 2019-03-10 DIAGNOSIS — Z5111 Encounter for antineoplastic chemotherapy: Secondary | ICD-10-CM | POA: Insufficient documentation

## 2019-03-10 DIAGNOSIS — C182 Malignant neoplasm of ascending colon: Secondary | ICD-10-CM

## 2019-03-10 DIAGNOSIS — C779 Secondary and unspecified malignant neoplasm of lymph node, unspecified: Secondary | ICD-10-CM | POA: Insufficient documentation

## 2019-03-10 NOTE — Telephone Encounter (Signed)
Scheduled appt per 12/10 los - pt aware of appt date and time   

## 2019-03-10 NOTE — Telephone Encounter (Signed)
[name] has been cld and scheduled to see Dr. Benay Spice today at 2pm. He's been made aware to arrive 15 minutes early.

## 2019-03-10 NOTE — Progress Notes (Signed)
[place] Patient Consult   Requesting MD: Keiston Manley 53 y.o.  [birth year]    Reason for Consult: Colon cancer   HPI: Mr. Guercio underwent a screening colonoscopy on 01/07/2019.  A partially obstructing mass was found in the transverse colon.  The mass was circumferential.  Oozing was present.  Biopsies were obtained.  The area was tattooed.  A small polyps was found in the ascending colon.  The polyps were not removed.  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  Mismatch repair protein expression was normal. A CT of the abdomen pelvis revealed subpleural and pleural-based nodules in the lung bases.  No focal liver abnormality.  Heterogenous poorly marginated mass in the spleen, present in 2015 suggesting a benign lesion.  Status post left nephrectomy.  The transverse colon lesion is not seen.  No abdominal pelvic lymphadenopathy.  Dextroscoliosis. A chest CT on 01/20/2019 revealed a small subpleural nodule in the right lower lobe, 2 adjacent parenchymal nodules in the right lower lobe measuring 5 mm.  He was referred to Dr. Johney Maine and was taken to the operating room on 03/04/2019 for a robotic assisted left hemicolectomy and accessory splenectomy.  A mass was confirmed in the mid descending colon.  No evidence of metastatic disease.  A 5 mm ellipsoid mass was noted in the mesentery near the hilum of the spleen consistent with an accessory spleen.  This was removed.  The spleen appeared enlarged without nodularity.  An anastomosis was created between the transverse colon and proximal rectum.  The pathology ([id number]) revealed a moderately differentiated adenocarcinoma of the descending colon.  The tumor had extracellular mucin and signet ring cells.  Tumor extended through the serosa.  The radial margin is positive.  Lymphovascular and perineural invasion are present.   No tumor deposits.  8 of 26 lymph nodes contained metastatic carcinoma.  Mismatch repair protein expression returned normal.  The splenic mesenteric mass returned as encapsulated fat necrosis.  There was an additional tubular adenoma in the left colon specimen.  Mr. Colpitts was discharged home on 03/04/2019.  He is now exercising.  He reports feeling completely well prior to surgery.  Past Medical History:  Diagnosis Date  . Allergic rhinitis   . Asthma    animal dander & seasonal asthma  . Colon cancer (HCC)-descending colon, T4N2 01/26/2019  . Diabetes mellitus without complication ([place])    dx 2010  type 2  . Discitis of lumbosacral region    first started with this ..and rolled onto the endocarditis    stayed a month  . 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 Family history of colon cancer   . Family history of thyroid cancer   . H/O scoliosis   . 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 History of hiatal hernia   . Hyperlipidemia   . Hypertension    Diagnostic exercise tolerance test assessment:04/30/2010 : comments normal -no evidence os ischemia by ST analysis  . lipoma   . Lipoma 01/26/2019  . Mitral regurgitation    Echo 12/2018: EF 60-65, myxomatous mitral valve, severe mitral regurgitation, partial flail leaflet of posterior mitral valve, myxomatous tricuspid valve with trivial TR, ascending aorta mildly dilated (39 mm)  . Pneumonia   . PONV (postoperative nausea and vomiting)   . Solitary kidney   . 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 BIOPSY  01/07/2019   Procedure: BIOPSY;  Surgeon: Wilford Corner, MD;  Location: [facility];  Service: Endoscopy;;  . COLONOSCOPY WITH PROPOFOL N/A 01/07/2019   Procedure: COLONOSCOPY WITH PROPOFOL;  Surgeon: Wilford Corner, MD;  Location: [facility];  Service: Endoscopy;  Laterality: N/A;  . INGUINAL HERNIA REPAIR Left  04/23/2016   Procedure: LAPAROSCOPIC  INGUINAL REPAIR;  Surgeon: Clovis Riley, MD;  Location: [facility];  Service: General;  Laterality: Left;  . KNEE SURGERY     arthroscopic left knee  . LIPOMA EXCISION  2015   x20 [facility]  . SUBMUCOSAL INJECTION  01/07/2019   Procedure: SUBMUCOSAL INJECTION;  Surgeon: Wilford Corner, MD;  Location: [facility];  Service: Endoscopy;;  . THYROID SURGERY  2010  . TONSILLECTOMY    . TOTAL NEPHRECTOMY Left 1970   Wilms Tumor left kidney excised as an infant    Medications: Reviewed  Allergies:  Allergies  Allergen Reactions  . No Known Allergies     Family history: A maternal aunt had colon cancer at age 52.  A maternal great uncle had colon cancer in his 28s.  A paternal great uncle had colon cancer in his 62s.  Social History:   He lives with his wife and [place].  He works as a Film/video editor.  He is not use cigarettes.  He reports social alcohol use.  No transfusion history.  No risk factor for HIV or hepatitis.  ROS:   Positives include: Multiple lipomas  A complete ROS was otherwise negative.  Physical Exam:  Blood pressure 123/82, pulse 77, temperature 98 F (36.7 C), temperature source Temporal, resp. rate 20, height 6' (1.829 m), weight 196 lb 3.2 oz (89 kg), SpO2 100 %.  HEENT: Neck without mass Lungs: Clear bilaterally Cardiac: Regular rate and rhythm Abdomen: No hepatosplenomegaly, healing surgical incisions GU: Testes without mass Vascular: No leg edema Lymph nodes: No cervical, supraclavicular, axillary, or inguinal nodes Neurologic: Alert and oriented, motor exam appears intact in the upper and lower extremities bilaterally Skin: Ecchymoses surrounding the abdominal wall surgical sites, multiple lipomas Musculoskeletal: Diminished muscle formation at the left flank region   LAB:  CBC  Lab Results  Component Value Date   WBC 8.0 03/05/2019   HGB 13.3 03/05/2019   HCT 39.6 03/05/2019   MCV 95.0 03/05/2019     PLT 129 (L) 03/05/2019   NEUTROABS 7.1 01/08/2019        CMP  Lab Results  Component Value Date   NA 141 03/05/2019   K 4.1 03/05/2019   CL 106 03/05/2019   CO2 25 03/05/2019   GLUCOSE 135 (H) 03/05/2019   BUN 14 03/05/2019   CREATININE 1.06 03/05/2019   CALCIUM 8.5 (L) 03/05/2019   PROT 7.0 01/08/2019   ALBUMIN 4.0 01/08/2019   AST 19 01/08/2019   ALT 23 01/08/2019   ALKPHOS 49 01/08/2019   BILITOT 1.8 (H) 01/08/2019   GFRNONAA >60 03/05/2019   GFRAA >60 03/05/2019       Imaging: As per HPI, CT images from 01/20/2019 01/08/2019 reviewed with Mr. Diltz and his wife  Assessment/Plan:   1. Adenocarcinoma of the descending colon, stage IIIC (W8G8B), moderately differentiated adenocarcinoma with mucinous and signet cell features, status post a left colectomy 03/04/2019  Mass felt to be in the transverse colon on a colonoscopy 01/07/2019 with a biopsy confirming poorly differentiated adenocarcinoma with signet ring cell features,  mismatch repair protein expression normal  CT abdomen/pelvis 01/08/2019 12.3 cm indeterminate splenic mass, present in 2015 on a lumbar MRI-felt to be benign, small subpleural and pleural-based nodules at the lung bases, status post left nephrectomy  CT chest 01/20/2019-small bilateral pulmonary nodules with rounded parenchymal nodules in the right lower lobe, indeterminate splenic mass  2. Multiple colon polyps-ascending colon polyps on the colonoscopy 01/07/2019-not removed 3. Wilms tumor at age 59, status post chemotherapy/radiation and a nephrectomy in [place] 4. 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 Lumbar discitis 2015 7. Diabetes 8. Asthma 9. Multiple lipomas 10. Family history of colon cancer  [employer] panel 2020-POLE VUS 11.  Left nephrectomy at age 89   Disposition:   Mr. Kracht has been diagnosed with left-sided colon cancer.  He underwent a left  colectomy was confirmed to have stage III disease.  The T4 tumor and multiple involved lymph nodes placed him in the high risk stage III category with a significant chance of developing recurrent colorectal cancer over the next several years.  There were indeterminate lung nodules on the staging chest CT.  He will be referred for a repeat chest CT to follow-up on the rounded nodules in the right chest.  We discussed the benefit associated with adjuvant 5-fluorouracil and oxaliplatin based chemotherapy in patients with resected stage III colon cancer.  The prognosis and treatment recommendation will change if the chest CT is suggestive of metastatic disease.  We discussed FOLFOX and CAPOX chemotherapy.  We reviewed potential toxicities associated with these chemotherapy regimens.  I recommend FOLFOX chemotherapy for 6 months.  We reviewed potential toxicities associated with the FOLFOX regimen including the chance for nausea/vomiting, mucositis, diarrhea, alopecia, and hematologic toxicity.  We discussed the sun sensitivity, rash, hyperpigmentation, and hand/foot syndrome associated with 5-fluorouracil.  We reviewed the allergic reaction and various types of neuropathy seen with oxaliplatin.  He agrees to proceed.  He will attend a chemotherapy teaching class.  Mr. Aragones would like to begin adjuvant chemotherapy prior to Christmas.  He will be scheduled for an office visit and cycle 1 FOLFOX on 03/21/2019.  We will refer him to Dr. Johney Maine for Port-A-Cath placement.  He will be seen for an office visit on 03/21/2019.  He does not appear to have an identifiable hereditary cancer syndrome, but his family members are at increased risk of developing colorectal cancer and should receive appropriate screening.  There were right-sided polyps on the preoperative colonoscopy.  These polyps were not removed.  We will refer him for a surveillance colonoscopy within the next 6-12 months.  I will present his case at the  GI tumor conference.  Betsy Coder, MD  03/10/2019, 5:30 PM

## 2019-03-11 ENCOUNTER — Telehealth: Payer: Self-pay | Admitting: *Deleted

## 2019-03-11 NOTE — Telephone Encounter (Signed)
Needs to reschedule his chemo class and labs from 12/16--having port placed that day. High priority scheduling message sent.

## 2019-03-12 ENCOUNTER — Other Ambulatory Visit (HOSPITAL_COMMUNITY)
Admission: RE | Admit: 2019-03-12 | Discharge: 2019-03-12 | Disposition: A | Discharge status: Home / Self Care | Payer: BC Managed Care – PPO | Source: Ambulatory Visit | Attending: Surgery | Admitting: Surgery

## 2019-03-12 ENCOUNTER — Telehealth: Payer: Self-pay | Admitting: Oncology

## 2019-03-12 ENCOUNTER — Other Ambulatory Visit (HOSPITAL_COMMUNITY): Payer: BC Managed Care – PPO

## 2019-03-12 DIAGNOSIS — Z20828 Contact with and (suspected) exposure to other viral communicable diseases: Secondary | ICD-10-CM | POA: Diagnosis not present

## 2019-03-12 DIAGNOSIS — Z01812 Encounter for preprocedural laboratory examination: Secondary | ICD-10-CM | POA: Insufficient documentation

## 2019-03-12 NOTE — Telephone Encounter (Signed)
R/s appt per 12/11 sch message - unable to reach pt and unable to leave message. Pt is very my chart active , but will try to give patient a call again Monday to let him know of appt change

## 2019-03-13 LAB — NOVEL CORONAVIRUS, NAA (HOSP ORDER, SEND-OUT TO REF LAB; TAT 18-24 HRS): SARS-CoV-2, NAA: NOT DETECTED

## 2019-03-14 ENCOUNTER — Ambulatory Visit: Payer: Self-pay | Admitting: Surgery

## 2019-03-14 ENCOUNTER — Telehealth: Payer: Self-pay | Admitting: Oncology

## 2019-03-14 NOTE — H&P (View-Only) (Signed)
[name] Documented: 03/14/2019 12:12 PM Location: [facility] Patient #: [id number] DOB: 1965-09-24 Married / Language: [name] / Race: White Male  History of Present Illness David Hector MD; 03/14/2019 12:43 PM) The patient is a 53 year old male who presents with colorectal cancer. 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 03/04/2019  Pathologic Stage Classification (pTNM, AJCC 8th Edition): pT4a, pN2b Regional Lymph Nodes: Number of Lymph Nodes Involved: 8 Number of Lymph Nodes Examined: 26     The patient returns to clinic after surgery, gradually improving. Pain from the incisions is fading away. His appetite good. He is doing supplemental shakes. Hesitant to go back to a totally regular diet and started doing it last week. Moving his bowels once or twice a day. No bad diarrhea. Only needed tramadol for the first few nights. His original dressings on. Denies nausea, constipation/diarrhea, worsening fatigue, high fevers, or other concerns. `  Pathology: SURGICAL PATHOLOGY  THIS IS AN ADDENDUM REPORT * CASE: [id number] PATIENT: [name] Surgical Pathology Report  Reason for Addendum #1: Additional special stains  Clinical History: transverse colon cancer, splenic mass (rd)     FINAL MICROSCOPIC DIAGNOSIS:  A. 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 - Tumor invades through serosa. - Mucosal resection margins are negative. - Radial resection margin is positive. - Tubular adenoma (x1). - Metastatic adenocarcinoma in eight of twenty-six lymph nodes (8/26). - See oncology table.  B. SPLEEN, ACCESSORY, RESECTION: - Findings consistent with peritoneal loose body (encapsulated fat necrosis).  C. COLON, FINAL DISTAL MARGIN, BIOPSY: -  Benign colonic mucosa. - No dysplasia or malignancy.  ONCOLOGY TABLE:  COLON AND RECTUM: Resection, Including Transanal Disk Excision of Rectal Neoplasms  Procedure: Left colon resection. 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 Radial margin is positive Treatment Effect: No known presurgical therapy Lymphovascular Invasion: Present Perineural Invasion: Present Tumor Deposits: Not identified Regional Lymph Nodes: Number of Lymph Nodes Involved: 8 Number of Lymph Nodes Examined: 26 Pathologic Stage Classification (pTNM, AJCC 8th Edition): pT4a, pN2b Ancillary Studies: MMR / MSI testing will be ordered. Representative Tumor Block: A5 Comments: Tumor is seen penetrating the mucosa in several foci. Tumor is also present at the black inked radial margin. There is extensive lymphovascular invasion surrounding single lymph nodes.  (v4.1.0.0)   [name] DESCRIPTION:  A. Specimen: Multiple portion of transverse colon, descending colon, possible portion of sigmoid colon Specimen integrity: Open end is proximal per requisition sheet Specimen length: Possible portion of transverse colon is 8 cm, descending colon is 19 cm, possible sigmoid colon is 8 cm Mesorectal intactness: Not applicable Tumor location: Descending colon, radial side. Tumor size: Tumor is fungating and centrally ulcerative, 3.2 x 2.5 cm Percent of bowel circumference involved: 50% Tumor distance to margins: Proximal: 10 cm Distal: 21 cm Mesenteric (sigmoid and transverse): Greater than 2 cm Radial (posterior ascending, posterior descending; lateral and posterior  mid-rectum; and entire lower 1/3 rectum): Focally abuts the radial margin and an area of induration Macroscopic extent of tumor invasion: Tumor invades the muscularis propria grossly. Total presumed lymph nodes: 30 possible lymph nodes range from 0.1 to 0.5 cm identified. Extramural satellite tumor nodules: None identified Mucosal  polyp(s): A 0.1 cm sessile polyp in the transverse colon is identified Additional findings: None Block summary: 1 = proximal margin en face 2 = distal margin en face 3-6 = tumor to radial margin, margin inked black 7 = tumor to normal 8 = transverse colon polyp 9 = 5 lymph nodes 10 = 5 lymph nodes 11 = 5 lymph nodes 12 = 5 lymph nodes 13 = 5 lymph nodes 14 = 5 lymph nodes  B. Received fresh is a 10.5 g, 3.5 x 2.6 x 2 cm tan firm nodule with minimal attached yellow lobular adipose tissue. Sectioning reveals soft tan-yellow cut surfaces. Representative sections are submitted in 4 cassettes.  C. Received fresh is a 1.5 x 2.2 cm donut shaped portion of tan-pink to hyperemic mucosa, grossly free of lesions. Representative sections submitted in 1 cassette. (AK 03/04/2019)     Final Diagnosis performed by David Males, MD. Electronically signed 03/08/2019 Technical and / or Professional components performed at [facility], [place] [address], [place], [place] [address]. Immunohistochemistry Technical component (if applicable) was performed at [facility]. [address], [place], [place], [place] [address]. IMMUNOHISTOCHEMISTRY DISCLAIMER (if applicable): Some of these immunohistochemical stains may have been developed and the performance characteristics determine by [facility]. Some may not have been cleared or approved by the U.S. Food and Drug Administration. The FDA has determined that such clearance or approval is not necessary. This test is used for clinical purposes. It should not be  regarded as investigational or for research. This laboratory is certified under the [place] (CLIA-88) as qualified to perform high complexity clinical laboratory testing. The controls stained appropriately.  ADDENDUM:  A. Mismatch Repair Protein (IHC) SUMMARY INTERPRETATION: NORMAL There is preserved expression of the major MMR proteins. There is a very low probability that microsatellite instability (MSI) is present. However, certain clinically significant MMR protein mutations may result in preservation of nuclear expression. It is recommended that the preservation of protein expression be correlated with molecular based MSI testing.  IHC EXPRESSION RESULTS TEST RESULT MLH1: Preserved nuclear expression MSH2: Preserved nuclear expression MSH6: Preserved nuclear expression PMS2: Preserved nuclear expression  References: 1. Guidelines on Genetic Evaluation and Management of Lynch Syndrome: A Consensus Statement by the [place] Multi-Society Task Force on Colorectal Cancer Gae Dry. Sherlie Ban , MD, and other . Am Nicki [place] 2014; [phone]; doi: 10.1038/ajg.2014.186; published online 19 October 2012 2. Outcomes of screening endometrial cancer patients for Lynch syndrome by patient-administered checklist. Olena Heckle MS, and others. Gynecol Oncol 2013;131(3):619-623. 3. Muir-Torre syndrome (MTS): An update and approach to diagnosis and management. Shelly Flatten, MD and others. J Am Acad Dermatol [phone]  Disclaimer Some of these immunohistochemical stains may have been developed and the performance characteristics determined by [facility]. Some may not have been cleared or approved by the U.S. Food and Drug Administration. The FDA has determined that such clearance or approval is not necessary. This test is used for clinical purposes. It should not be regarded as investigational  or for research. This laboratory is certified under the [place] (CLIA-88) as qualified to perform high complexity clinical laboratory testing. Testing performed: [place], [place], [place] , [place] [address]        Addendum #1 performed by Susanne Greenhouse, MD. Electronically signed 03/09/2019 Technical and / or Professional components performed at [facility], [place] [address] [place], [place] [address]. Immunohistochemistry Technical  component (if applicable) was performed at [facility]. [address], [place], [place], [place] [address]. IMMUNOHISTOCHEMISTRY DISCLAIMER (if applicable): Some of these immunohistochemical stains may have been developed and the performance characteristics determine by [facility]. Some may not have been cleared or approved by the U.S. Food and Drug Administration. The FDA has determined that such clearance or approval is not necessary. This test is used for clinical purposes. It should not be regarded as investigational or for research. This laboratory is certified under the [place] (CLIA-88) as qualified to perform high complexity clinical laboratory testing. The controls stained appropriately. ` ` `    03/04/2019  11:05 AM  PATIENT: [name] 53 y.o. 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 An experienced assistant was required given the standard of surgical care given the complexity of the case. This assistant was needed for exposure, dissection, suctioning, retraction, instrument exchange, etc.  ANESTHESIA:   General  Nerve block provided with liposomal bupivacaine (Experel) mixed with 0.25% bupivacaine as a Bilateral TAP block x 55m each side at the level of the transverse abdominis & preperitoneal spaces along the flank at the anterior axillary line, from subcostal ridge to iliac crest under laparoscopic guidance  Local field block at port sites & extraction wound  EBL: Total I/O In: 2000 [I.V.:2000] Out: 165 [Urine:115; Blood:50]  Delay start of Pharmacological VTE agent (>24hrs) due to surgical blood loss or risk of bleeding: no  DRAINS: No  SPECIMEN:  DISTAL "LEFT" COLON (open end proximal) DISTAL ANASTOMOTIC RING (final distal margin) LUQ MASS (PROBABLE ACCESSORY SPLEEN)  DISPOSITION OF SPECIMEN: PATHOLOGY  COUNTS: YES  PLAN OF CARE: Admit to inpatient  PATIENT DISPOSITION: PACU - hemodynamically stable.  INDICATION:   Pleasant gentleman. 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 Found to have a mass about 75 cm from the anus on colonoscopy. Biopsy consistent with adenocarcinoma. No evidence of any metastatic disease. Has splenomegaly with large central mass most likely consistent with hemangioma that has been stable for several years. I recommended segmental resection:  The anatomy & physiology of the digestive tract was discussed. The pathophysiology  was discussed. Natural history risks without surgery was discussed. I worked to give an overview of the disease and the frequent need to have multispecialty involvement. I feel the risks of no intervention will lead to serious problems that outweigh the operative risks; therefore, I recommended a partial colectomy to remove the pathology. Laparoscopic & open techniques were discussed.  Risks such as bleeding, infection, abscess, leak, reoperation, possible ostomy, hernia, heart attack, death, and other risks were discussed. I noted a good likelihood this will help address the problem. Goals of post-operative recovery were discussed as well. We will work to minimize complications. Educational materials on the pathology had been given  in the office. Questions were answered.   The patient expressed understanding & wished to proceed with surgery.  OR FINDINGS:  Patient had tattooing at the splenic flexure and at the proximal sigmoid colon. Mass in the mid descending colon. No definite frank lymphadenopathy or metastatic disease.  Patient had moderate adhesions from his prior laparotomy and radical left nephrectomy. Primarily wispy omental adhesions. 5 x 4 x 3 cm ellipsoid mass within the mesentery near the hilum of the spleen consistent with accessory spleen. Removed. Obvious enlarged left spleen but smooth appearing without any frank nodularity metastatic disease. Left in place. No obvious metastatic disease on visceral parietal peritoneum or liver.  The anastomosis rests 17 cm from the anal verge by rigid proctoscopy. It is end mid-transverse to end proximal rectum 31 EEA stapled anastomosis.  DESCRIPTION:  Informed consent was confirmed. The patient underwent general anaesthesia without difficulty. The patient was positioned appropriately. VTE prevention in place. The patient was clipped, prepped, & draped in a sterile fashion. Surgical timeout confirmed our plan. The  patient was positioned in reverse Trendelenburg. Abdominal entry was gained using Varess technique at the right subcostal ridge on the anterior abdominal wall. Patient had some mild hypotension. Gas desufflated. No elevated EtCO2 noted. Blood pressure improved. Again recent flatus slowly without any more hypotension. Port placed. Camera inspection revealed small needle puncture on the liver. Mild oozing controlled and stopped spontaneously. Moderate adhesions noted of greater omentum to the upper midline and left upper quadrant. Extra ports were carefully placed under direct laparoscopic visualization.  Could see tattooing at the splenic flexure. This seemed to be consistent where gastroneurology had suspected the tumor was. The patient was carefully positioned. The Intuitive daVinci robot was docked with camera & instruments carefully placed. I did robotic lysed lesions to free greater omentum off the anterior abdominal wall towards the left upper quadrant. It was rather thin and wispy. He had quite redundant stretched out:. Because of his prior left nephrectomy, his small bowel rested in the left retroperitoneum. However there were no retroperitoneal adhesions. Could noted obviously enlarged spleen. There was an ellipsoid mass near the splenic hilum within the omentum. Most likely consistent with a accessory spleen. It looked whitish and possibly ischemic. Therefore was transected off the omentum. It was later removed with the primary specimen.  Good look like this is permanent the splenic flexure and side to mobilize the splenic flexure. Freed greater omental attachments off the transverse colon towards the splenic flexure. Got into the lesser sac. I found a healthy right proximal middle colic pedicle and left That intact. He had redundant stretched out colon and his transverse colon could easily reach down towards his infraumbilical region already. Transected the left medical  colic arterial pedicle and gradually help free the splenic flexure attachments off the left retroperitoneum. And then mobilized the mid descending colon a lateral medial fashion and came up more proximally. Alternated between the descending colon and transverse colon side to eventually freed the splenic flexure off its retroperitoneal attachments. There were some wispy splenocolic attachments. Rather vascularized but able to gradually freed these off.  With this mobilization became apparent that the actual mass was in the mid descending colon. It was not tattooed. We reposition the patient in more Trendelenburg positioning. Got the small bowel out of the pelvis and left side of the abdomen. He had a stretched out redundant sigmoid colon. Tattooing noted at the descending/sigmoid junction. Because this seemed more distal initially dissipated, both Dr. Marcello Moores and I  agreed he would need high ligation of his inferior mesenteric artery and venous pedicles for adequate lymph node sampling. We elevated his very redundant sigmoid colon to help identify the pedicle. I scored the base of peritoneum of the medial side of the mesentery of the elevated left colon from the neo-ligament of Treitz to the peritoneal reflection of the mid rectum. I elevated the sigmoid mesentery and entered into the retro-mesenteric plane. Despite his prior left nephrectomy we were actually able to identify the distal left ureter and gonadal vessels. He had very little intra-abdominal or mesenteric or retroperitoneal fat. Could easily see the right ureter in its natural right retroperitoneal position far away from where we needed to be. Kept those retroperitoneal structures we kept those posterior within the retroperitoneum and elevated the left colon mesentery off that. I did isolate the inferior mesenteric artery (IMA) pedicle but did not ligate it yet. I continued distally and got into the avascular plane at the level of the  sacral promontory.   I elevated the left colon mesentery off the retroperitoneum and mobilized in a medial lateral fashion as much as possible. Somewhat of a deep dive since he had already had a left nephrectomy. His entire left colon mesentery was extremely thinned out but we were to try and preserve it. I skeletonized the lymph nodes off the inferior mesenteric artery pedicle. I went down to its takeoff from the aorta. I isolated the inferior mesenteric vein off of the ligament of Treitz just cephalad to that as well. After confirming the left ureter was out of the way, I went ahead and ligated the inferior mesenteric artery pedicle just near its takeoff from the aorta. I did ligate the inferior mesenteric vein in a similar fashion. Then further dissection to help transect the splenic flexure mesentery. However was very wispy with little vascular content. Very little colon mesentery tissue between the distal left middle colic arterial pedicle and the left colic pedicle. Mobilized the descending colon in a lateral medial fashion also the sigmoid colon.  We ensured hemostasis. I skeletonized the mesorectum at the junction at the proximal rectum for the distal point of resection. Chose a region of the mid transverse colon that could reach down to the pelvis. Tissues looked a little purplish but there have been a lot of tattooing everywhere. Because of his atypical anatomy and atypical location with wispy tissues, we decided to test perfusion using firefly We asked anesthesia to dilute the indocyanine green (ICG) to 10 mL and inject 3 mL intravenously with IV flush. I switched to the NIR fluorescence (Firefly mode) imaging window on the daVinci platform. I was able to see good light green visualization of blood vessels with good perfusion of tissues to the distal transverse colon. Also good perfusion of the rectum coming up to the preselected transection point, confirming good tissue perfusion  of both ends planned for anastomosis. I did up transecting some redundant mesentery on the mid transverse colon to the preselected region that easily reached down. I transected at the proximal rectum using a robotic 60 mm green load stapler. I chose a region at the descending/sigmoid junction that was soft and easily reached down to the rectal stump.  I created an extraction incision through a small Pfannenstiel incision including the 12 mm stapler port site in the suprapubic region. Placed a wound protector. I was able to eviscerate the left colon out the wound. I clamped the mid transverse colon using a reusable pursestringer device. Passed a  2-0 Lanny Hurst needle. I transected at the descending/sigmoid junction with a scalpel. I got healthy bleeding mucosa. We sent the rectosigmoid colon specimen off to go to pathology. We sized the colon orifice. I chose a 31 EEA anvil stapler system. I reinforced the prolene pursestring with interrupted silk suture. I placed the anvil to the open end of the proximal remaining colon and closed around it using the pursestring. Return the colon back in the abdomen.  We induced laparoscopic insufflation. I mobilized the small bowel out of the left retroperitoneum to the right side such that the transverse colon could reach down along the left retroperitoneum towards the pelvis without tension. We did copious irrigation with crystalloid solution. Hemostasis was good. The distal end of the remaining colon easily reached down to the rectal stump. Dr Marcello Moores scrubbed down and did gentle anal dilation and advanced the EEA stapler up the rectal stump. The spike was brought out at the provimal end of the rectal stump under direct visualization. I attached the anvil of the proximal colon the spike of the stapler. Anvil was tightened down and held clamped for 60 seconds. The EEA stapler was fired and held clamped for 30 seconds. The stapler was released & removed. We  noted 2 excellent anastomotic rings. Blue stitch is in the proximal ring. Dr Marcello Moores did rigid proctoscopy noted the anastomosis was at 17 cm from the anal verge consistent with the proximal rectum. We did a final irrigation of antibiotic solution (900 mg clindamycin/240 mg gentamicin in a liter of crystalloid) & held that for the pelvic air leak test . The rectum was insufflated the rectum while clamping the colon proximal to that anastomosis. There was a negative air leak test. There was no tension of mesentery or bowel at the anastomosis. Tissues looked viable. Ureters & bowel uninjured. The anastomosis looked healthy.  Endoluminal gas was evacuated. Ports & wound protector removed. We changed gloves & redraped the patient per colon SSI prevention protocol. We aspirated the antibiotic irrigation. Hemostasis was good. Sterile unused instruments were used from this point. I closed the skin at the port sites using Monocryl stitch and sterile dressing. I closed the extraction wound using a 0 Vicryl vertical peritoneal closure and a #1 PDS transverse anterior rectal fascial closure like a small Pfannenstiel closure. I closed the skin with some interrupted Monocryl stitches. I placed sterile dressings.   Patient is being extubated go to recovery room. I had discussed postop care with the patient in detail the office & in the holding area. Instructions are written. I discussed operative findings, updated the patient's status, discussed probable steps to recovery, and gave postoperative recommendations to the patient's spouse, Nishaan Stanke. Recommendations were made. Questions were answered. She expressed understanding & appreciation.    [name], M.D., F.A.C.S. Gastrointestinal and Minimally Invasive Surgery [facility] [address] [place], [place] [address] [phone] Main / Paging   Allergies Emeline Gins, [place]; 03/14/2019 12:13  PM) No Known Drug Allergies [02/15/2016]: Allergies Reconciled  Medication History Emeline Gins, CMA; 03/14/2019 12:13 PM) Benadryl (25MG Tablet, Oral) Active. ProAir Digihaler (108MCG/ACT Aero Pow Br Act, Inhalation) Active. ProAir HFA (108 (90 Base)MCG/ACT Aerosol Soln, Inhalation) Active. Aspirin (81MG Tablet, Oral) Active. OneTouch Ultra Blue (In Vitro) Active. Tums (500MG Tablet Chewable, Oral) Active. Multivitamins/Minerals (Oral) Active. Fish Oil (1000MG Capsule, Oral) Active. Atorvastatin Calcium (10MG Tablet, Oral) Active. Lisinopril (2.5MG Tablet, Oral) Active. Asmanex 30 Metered Doses (220MCG/INH Aero Pow Br Act, Inhalation) Active.  Toprol XL (25MG Tablet ER 24HR, Oral) Active. MetFORMIN HCl (1000MG Tablet, Oral) Active. Repaglinide (1MG Tablet, Oral) Active. Medications Reconciled    Vitals Emeline Gins CMA; 03/14/2019 12:13 PM) 03/14/2019 12:13 PM Weight: 193.8 lb Height: 72in Body Surface Area: 2.1 m Body Mass Index: 26.28 kg/m  Temp.: 97.15F  Pulse: 95 (Regular)  BP: 160/96 (Sitting, Left Arm, Standard)        Physical Exam David Hector MD; 03/14/2019 12:45 PM)  General Mental Status-Alert. General Appearance-Not in acute distress, Not Sickly. Orientation-Oriented X3. Hydration-Well hydrated. Voice-Normal. Note: [place] alert. Moving regularly. No guarding or hesitancy.  Integumentary Global Assessment Upon inspection and palpation of skin surfaces of the - Axillae: non-tender, no inflammation or ulceration, no drainage. and Distribution of scalp and body hair is normal. General Characteristics Temperature - normal warmth is noted.  Head and Neck Head-normocephalic, atraumatic with no lesions or palpable masses. Face Global Assessment - atraumatic, no absence of expression. Neck Global Assessment - no abnormal movements, no bruit auscultated on the right, no bruit auscultated on the left,  no decreased range of motion, non-tender. Trachea-midline. Thyroid Gland Characteristics - non-tender.  Eye Eyeball - Left-Extraocular movements intact, No Nystagmus - Left. Eyeball - Right-Extraocular movements intact, No Nystagmus - Right. Cornea - Left-No Hazy - Left. Cornea - Right-No Hazy - Right. Sclera/Conjunctiva - Left-No scleral icterus, No Discharge - Left. Sclera/Conjunctiva - Right-No scleral icterus, No Discharge - Right. Pupil - Left-Direct reaction to light normal. Pupil - Right-Direct reaction to light normal.  ENMT Ears Pinna - Left - no drainage observed, no generalized tenderness observed. Pinna - Right - no drainage observed, no generalized tenderness observed. Nose and Sinuses External Inspection of the Nose - no destructive lesion observed. Inspection of the nares - Left - quiet respiration. Inspection of the nares - Right - quiet respiration. Mouth and Throat Lips - Upper Lip - no fissures observed, no pallor noted. Lower Lip - no fissures observed, no pallor noted. Nasopharynx - no discharge present. Oral Cavity/Oropharynx - Tongue - no dryness observed. Oral Mucosa - no cyanosis observed. Hypopharynx - no evidence of airway distress observed.  Chest and Lung Exam Inspection Movements - Normal and Symmetrical. Accessory muscles - No use of accessory muscles in breathing. Palpation Palpation of the chest reveals - Non-tender. Auscultation Breath sounds - Normal and Clear.  Cardiovascular Auscultation Rhythm - Regular. Murmurs & Other Heart Sounds - Auscultation of the heart reveals - No Murmurs and No Systolic Clicks.  Abdomen Inspection Inspection of the abdomen reveals - No Visible peristalsis and No Abnormal pulsations. Umbilicus - No Bleeding, No Urine drainage. Palpation/Percussion Palpation and Percussion of the abdomen reveal - Soft, Non Tender, No Rebound tenderness, No Rigidity (guarding) and No Cutaneous hyperesthesia. 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 Unchanged.  Original robotic and Pfannenstiel and dressings on. Removed all those. Some ecchymosis but otherwise closed well. No hernias.  Male Genitourinary Sexual Maturity Tanner 5 - Adult hair pattern and Adult penile size and shape. Note: No recurrent hernias. Normal external male genitalia  Peripheral Vascular Upper Extremity Inspection - Left - No Cyanotic nailbeds - Left, Not Ischemic. Inspection - Right - No Cyanotic nailbeds - Right, Not Ischemic.  Neurologic Neurologic evaluation reveals -normal attention span and ability to concentrate, able to name objects and repeat phrases. Appropriate fund of knowledge , normal sensation and normal coordination. Mental Status Affect - not angry, not  paranoid. Cranial Nerves-Normal Bilaterally. Gait-Normal.  Neuropsychiatric Mental status exam performed with findings of-able to articulate well with normal speech/language, rate, volume and coherence, thought content normal with ability to perform basic computations and apply abstract reasoning and no evidence of hallucinations, delusions, obsessions or homicidal/suicidal ideation.  Musculoskeletal Global Assessment Spine, Ribs and Pelvis - no instability, subluxation or laxity. Right Upper Extremity - no instability, subluxation or laxity.  Lymphatic Head & Neck  General Head & Neck Lymphatics: Bilateral - Description - No Localized lymphadenopathy. Axillary  General Axillary Region: Bilateral - Description - No Localized lymphadenopathy. Femoral & Inguinal  Generalized Femoral & Inguinal Lymphatics: Left - Description - No Localized lymphadenopathy. Right - Description - No Localized lymphadenopathy.    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 The margins negative but question of a positive radial margin. 8/26 lymph nodes positive.  He is recovering remarkably well so far.  Continue nutrition and exercise.  He has seen medical oncology. It was felt he would benefit from post-adjuvant chemotherapy. To place Port-A-Cath in a few days under ultrasound and fluoroscopy. He is here to proceed with the next phase.  We will further discuss at GI tumor board later this week.  We'll be following closely with medical oncology. At this point, he can follow with me as needed. Very low threshold to reevaluate in the office but he seems to be doing quite well so far. Follow-up colonoscopy in one year and usual colorectal cancer survivable pathway. I will see him in 2 days to place the port  Current Plans Use of a central venous catheter for intravenous therapy was discussed. Technique of catheter placement using ultrasound and fluoroscopy guidance was discussed. Risks such as bleeding, infection, pneumothorax, catheter occlusion, reoperation, and other risks were discussed. I noted a good likelihood this will help address the problem. Questions were answered. The patient expressed understanding & wishes to proceed.  SPLENIC MASS (R16.1) Impression: Large splenic mass seen on prior MRI lumbar spine. Radiology favors hemangioma. Nothing suspicious intraoperatively and saphenous splenomegaly. Left alone. Smaller ellipsoid mass suspicious for accessory spleen removed. 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  Soreness, decreased appetite, and poor energy level are common problems after surgery. While many people can struggle with a bad day, these concerns should gradually fade away or at least improve. Much  of your recovery depends on your health & the severity of your operation. Please call if you have any further questions / concerns related to surgery.  Increase activity as tolerated to regular everyday activity. Consider daily low impact exercise every day such as walking an hour a day.  Do not push through pain. If it hurts to do it, then don't do it.  Diet as tolerated. Low fat high fiber diet ideal. 30 g fiber a day ideal. Consider taking a daily fiber supplement to keep your bowels regular.  Followup with your primary care physician for other health issues as would normally be done.  Consider screening for malignancies (breast, prostate, colon, melanoma, etc) as appropriate. Discuss with you primary care physician.  Consider follow up colonoscopy by your gastroenterologist.  Since you had a colorectal cancer resected by surgery, you should strongly consider getting  a colonoscopy by your gastroenterologict in one year after the surgery that removed your cancer. Call your gastroenterologist for advice  Pt Education - [facility] Colorectal Cancer (AT): discussed with patient and provided information. Pt Education - [facility] Good Bowel Health ([place])  David Hector, MD, FACS, MASCRS Gastrointestinal and Minimally Invasive Surgery  [facility] [address], [place], [place] [address] [phone] Main / Paging [phone] Fax

## 2019-03-14 NOTE — Telephone Encounter (Signed)
R/s appt per 12/11 sch message - unable to reach pt . Left message with new appt date and time

## 2019-03-14 NOTE — H&P (Signed)
[name] Documented: 03/14/2019 12:12 PM Location: [facility] Patient #: [id number] DOB: 1965-09-24 Married / Language: [name] / Race: White Male  History of Present Illness David Hector MD; 03/14/2019 12:43 PM) The patient is a 53 year old male who presents with colorectal cancer. 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 03/04/2019  Pathologic Stage Classification (pTNM, AJCC 8th Edition): pT4a, pN2b Regional Lymph Nodes: Number of Lymph Nodes Involved: 8 Number of Lymph Nodes Examined: 26     The patient returns to clinic after surgery, gradually improving. Pain from the incisions is fading away. His appetite good. He is doing supplemental shakes. Hesitant to go back to a totally regular diet and started doing it last week. Moving his bowels once or twice a day. No bad diarrhea. Only needed tramadol for the first few nights. His original dressings on. Denies nausea, constipation/diarrhea, worsening fatigue, high fevers, or other concerns. `  Pathology: SURGICAL PATHOLOGY  THIS IS AN ADDENDUM REPORT * CASE: [id number] PATIENT: [name] Surgical Pathology Report  Reason for Addendum #1: Additional special stains  Clinical History: transverse colon cancer, splenic mass (rd)     FINAL MICROSCOPIC DIAGNOSIS:  A. 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 - Tumor invades through serosa. - Mucosal resection margins are negative. - Radial resection margin is positive. - Tubular adenoma (x1). - Metastatic adenocarcinoma in eight of twenty-six lymph nodes (8/26). - See oncology table.  B. SPLEEN, ACCESSORY, RESECTION: - Findings consistent with peritoneal loose body (encapsulated fat necrosis).  C. COLON, FINAL DISTAL MARGIN, BIOPSY: -  Benign colonic mucosa. - No dysplasia or malignancy.  ONCOLOGY TABLE:  COLON AND RECTUM: Resection, Including Transanal Disk Excision of Rectal Neoplasms  Procedure: Left colon resection. 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 Radial margin is positive Treatment Effect: No known presurgical therapy Lymphovascular Invasion: Present Perineural Invasion: Present Tumor Deposits: Not identified Regional Lymph Nodes: Number of Lymph Nodes Involved: 8 Number of Lymph Nodes Examined: 26 Pathologic Stage Classification (pTNM, AJCC 8th Edition): pT4a, pN2b Ancillary Studies: MMR / MSI testing will be ordered. Representative Tumor Block: A5 Comments: Tumor is seen penetrating the mucosa in several foci. Tumor is also present at the black inked radial margin. There is extensive lymphovascular invasion surrounding single lymph nodes.  (v4.1.0.0)   [name] DESCRIPTION:  A. Specimen: Multiple portion of transverse colon, descending colon, possible portion of sigmoid colon Specimen integrity: Open end is proximal per requisition sheet Specimen length: Possible portion of transverse colon is 8 cm, descending colon is 19 cm, possible sigmoid colon is 8 cm Mesorectal intactness: Not applicable Tumor location: Descending colon, radial side. Tumor size: Tumor is fungating and centrally ulcerative, 3.2 x 2.5 cm Percent of bowel circumference involved: 50% Tumor distance to margins: Proximal: 10 cm Distal: 21 cm Mesenteric (sigmoid and transverse): Greater than 2 cm Radial (posterior ascending, posterior descending; lateral and posterior  mid-rectum; and entire lower 1/3 rectum): Focally abuts the radial margin and an area of induration Macroscopic extent of tumor invasion: Tumor invades the muscularis propria grossly. Total presumed lymph nodes: 30 possible lymph nodes range from 0.1 to 0.5 cm identified. Extramural satellite tumor nodules: None identified Mucosal  polyp(s): A 0.1 cm sessile polyp in the transverse colon is identified Additional findings: None Block summary: 1 = proximal margin en face 2 = distal margin en face 3-6 = tumor to radial margin, margin inked black 7 = tumor to normal 8 = transverse colon polyp 9 = 5 lymph nodes 10 = 5 lymph nodes 11 = 5 lymph nodes 12 = 5 lymph nodes 13 = 5 lymph nodes 14 = 5 lymph nodes  B. Received fresh is a 10.5 g, 3.5 x 2.6 x 2 cm tan firm nodule with minimal attached yellow lobular adipose tissue. Sectioning reveals soft tan-yellow cut surfaces. Representative sections are submitted in 4 cassettes.  C. Received fresh is a 1.5 x 2.2 cm donut shaped portion of tan-pink to hyperemic mucosa, grossly free of lesions. Representative sections submitted in 1 cassette. (AK 03/04/2019)     Final Diagnosis performed by Vicente Males, MD. Electronically signed 03/08/2019 Technical and / or Professional components performed at [facility], [place] [address], [place], [place] [address]. Immunohistochemistry Technical component (if applicable) was performed at [facility]. [address], [place], [place], [place] [address]. IMMUNOHISTOCHEMISTRY DISCLAIMER (if applicable): Some of these immunohistochemical stains may have been developed and the performance characteristics determine by [facility]. Some may not have been cleared or approved by the U.S. Food and Drug Administration. The FDA has determined that such clearance or approval is not necessary. This test is used for clinical purposes. It should not be  regarded as investigational or for research. This laboratory is certified under the [place] (CLIA-88) as qualified to perform high complexity clinical laboratory testing. The controls stained appropriately.  ADDENDUM:  A. Mismatch Repair Protein (IHC) SUMMARY INTERPRETATION: NORMAL There is preserved expression of the major MMR proteins. There is a very low probability that microsatellite instability (MSI) is present. However, certain clinically significant MMR protein mutations may result in preservation of nuclear expression. It is recommended that the preservation of protein expression be correlated with molecular based MSI testing.  IHC EXPRESSION RESULTS TEST RESULT MLH1: Preserved nuclear expression MSH2: Preserved nuclear expression MSH6: Preserved nuclear expression PMS2: Preserved nuclear expression  References: 1. Guidelines on Genetic Evaluation and Management of Lynch Syndrome: A Consensus Statement by the [place] Multi-Society Task Force on Colorectal Cancer Gae Dry. Sherlie Ban , MD, and other . Am Nicki [place] 2014; [phone]; doi: 10.1038/ajg.2014.186; published online 19 October 2012 2. Outcomes of screening endometrial cancer patients for Lynch syndrome by patient-administered checklist. Olena Heckle MS, and others. Gynecol Oncol 2013;131(3):619-623. 3. Muir-Torre syndrome (MTS): An update and approach to diagnosis and management. Shelly Flatten, MD and others. J Am Acad Dermatol [phone]  Disclaimer Some of these immunohistochemical stains may have been developed and the performance characteristics determined by [facility]. Some may not have been cleared or approved by the U.S. Food and Drug Administration. The FDA has determined that such clearance or approval is not necessary. This test is used for clinical purposes. It should not be regarded as investigational  or for research. This laboratory is certified under the [place] (CLIA-88) as qualified to perform high complexity clinical laboratory testing. Testing performed: [place], [place], [place] , [place] [address]        Addendum #1 performed by Susanne Greenhouse, MD. Electronically signed 03/09/2019 Technical and / or Professional components performed at [facility], [place] [address] [place], [place] [address]. Immunohistochemistry Technical  component (if applicable) was performed at [facility]. [address], [place], [place], [place] [address]. IMMUNOHISTOCHEMISTRY DISCLAIMER (if applicable): Some of these immunohistochemical stains may have been developed and the performance characteristics determine by [facility]. Some may not have been cleared or approved by the U.S. Food and Drug Administration. The FDA has determined that such clearance or approval is not necessary. This test is used for clinical purposes. It should not be regarded as investigational or for research. This laboratory is certified under the [place] (CLIA-88) as qualified to perform high complexity clinical laboratory testing. The controls stained appropriately. ` ` `    03/04/2019  11:05 AM  PATIENT: [name] 53 y.o. 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 An experienced assistant was required given the standard of surgical care given the complexity of the case. This assistant was needed for exposure, dissection, suctioning, retraction, instrument exchange, etc.  ANESTHESIA:   General  Nerve block provided with liposomal bupivacaine (Experel) mixed with 0.25% bupivacaine as a Bilateral TAP block x 55m each side at the level of the transverse abdominis & preperitoneal spaces along the flank at the anterior axillary line, from subcostal ridge to iliac crest under laparoscopic guidance  Local field block at port sites & extraction wound  EBL: Total I/O In: 2000 [I.V.:2000] Out: 165 [Urine:115; Blood:50]  Delay start of Pharmacological VTE agent (>24hrs) due to surgical blood loss or risk of bleeding: no  DRAINS: No  SPECIMEN:  DISTAL "LEFT" COLON (open end proximal) DISTAL ANASTOMOTIC RING (final distal margin) LUQ MASS (PROBABLE ACCESSORY SPLEEN)  DISPOSITION OF SPECIMEN: PATHOLOGY  COUNTS: YES  PLAN OF CARE: Admit to inpatient  PATIENT DISPOSITION: PACU - hemodynamically stable.  INDICATION:   Pleasant gentleman. 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 Found to have a mass about 75 cm from the anus on colonoscopy. Biopsy consistent with adenocarcinoma. No evidence of any metastatic disease. Has splenomegaly with large central mass most likely consistent with hemangioma that has been stable for several years. I recommended segmental resection:  The anatomy & physiology of the digestive tract was discussed. The pathophysiology  was discussed. Natural history risks without surgery was discussed. I worked to give an overview of the disease and the frequent need to have multispecialty involvement. I feel the risks of no intervention will lead to serious problems that outweigh the operative risks; therefore, I recommended a partial colectomy to remove the pathology. Laparoscopic & open techniques were discussed.  Risks such as bleeding, infection, abscess, leak, reoperation, possible ostomy, hernia, heart attack, death, and other risks were discussed. I noted a good likelihood this will help address the problem. Goals of post-operative recovery were discussed as well. We will work to minimize complications. Educational materials on the pathology had been given  in the office. Questions were answered.   The patient expressed understanding & wished to proceed with surgery.  OR FINDINGS:  Patient had tattooing at the splenic flexure and at the proximal sigmoid colon. Mass in the mid descending colon. No definite frank lymphadenopathy or metastatic disease.  Patient had moderate adhesions from his prior laparotomy and radical left nephrectomy. Primarily wispy omental adhesions. 5 x 4 x 3 cm ellipsoid mass within the mesentery near the hilum of the spleen consistent with accessory spleen. Removed. Obvious enlarged left spleen but smooth appearing without any frank nodularity metastatic disease. Left in place. No obvious metastatic disease on visceral parietal peritoneum or liver.  The anastomosis rests 17 cm from the anal verge by rigid proctoscopy. It is end mid-transverse to end proximal rectum 31 EEA stapled anastomosis.  DESCRIPTION:  Informed consent was confirmed. The patient underwent general anaesthesia without difficulty. The patient was positioned appropriately. VTE prevention in place. The patient was clipped, prepped, & draped in a sterile fashion. Surgical timeout confirmed our plan. The  patient was positioned in reverse Trendelenburg. Abdominal entry was gained using Varess technique at the right subcostal ridge on the anterior abdominal wall. Patient had some mild hypotension. Gas desufflated. No elevated EtCO2 noted. Blood pressure improved. Again recent flatus slowly without any more hypotension. Port placed. Camera inspection revealed small needle puncture on the liver. Mild oozing controlled and stopped spontaneously. Moderate adhesions noted of greater omentum to the upper midline and left upper quadrant. Extra ports were carefully placed under direct laparoscopic visualization.  Could see tattooing at the splenic flexure. This seemed to be consistent where gastroneurology had suspected the tumor was. The patient was carefully positioned. The Intuitive daVinci robot was docked with camera & instruments carefully placed. I did robotic lysed lesions to free greater omentum off the anterior abdominal wall towards the left upper quadrant. It was rather thin and wispy. He had quite redundant stretched out:. Because of his prior left nephrectomy, his small bowel rested in the left retroperitoneum. However there were no retroperitoneal adhesions. Could noted obviously enlarged spleen. There was an ellipsoid mass near the splenic hilum within the omentum. Most likely consistent with a accessory spleen. It looked whitish and possibly ischemic. Therefore was transected off the omentum. It was later removed with the primary specimen.  Good look like this is permanent the splenic flexure and side to mobilize the splenic flexure. Freed greater omental attachments off the transverse colon towards the splenic flexure. Got into the lesser sac. I found a healthy right proximal middle colic pedicle and left That intact. He had redundant stretched out colon and his transverse colon could easily reach down towards his infraumbilical region already. Transected the left medical  colic arterial pedicle and gradually help free the splenic flexure attachments off the left retroperitoneum. And then mobilized the mid descending colon a lateral medial fashion and came up more proximally. Alternated between the descending colon and transverse colon side to eventually freed the splenic flexure off its retroperitoneal attachments. There were some wispy splenocolic attachments. Rather vascularized but able to gradually freed these off.  With this mobilization became apparent that the actual mass was in the mid descending colon. It was not tattooed. We reposition the patient in more Trendelenburg positioning. Got the small bowel out of the pelvis and left side of the abdomen. He had a stretched out redundant sigmoid colon. Tattooing noted at the descending/sigmoid junction. Because this seemed more distal initially dissipated, both Dr. Marcello Moores and I  agreed he would need high ligation of his inferior mesenteric artery and venous pedicles for adequate lymph node sampling. We elevated his very redundant sigmoid colon to help identify the pedicle. I scored the base of peritoneum of the medial side of the mesentery of the elevated left colon from the neo-ligament of Treitz to the peritoneal reflection of the mid rectum. I elevated the sigmoid mesentery and entered into the retro-mesenteric plane. Despite his prior left nephrectomy we were actually able to identify the distal left ureter and gonadal vessels. He had very little intra-abdominal or mesenteric or retroperitoneal fat. Could easily see the right ureter in its natural right retroperitoneal position far away from where we needed to be. Kept those retroperitoneal structures we kept those posterior within the retroperitoneum and elevated the left colon mesentery off that. I did isolate the inferior mesenteric artery (IMA) pedicle but did not ligate it yet. I continued distally and got into the avascular plane at the level of the  sacral promontory.   I elevated the left colon mesentery off the retroperitoneum and mobilized in a medial lateral fashion as much as possible. Somewhat of a deep dive since he had already had a left nephrectomy. His entire left colon mesentery was extremely thinned out but we were to try and preserve it. I skeletonized the lymph nodes off the inferior mesenteric artery pedicle. I went down to its takeoff from the aorta. I isolated the inferior mesenteric vein off of the ligament of Treitz just cephalad to that as well. After confirming the left ureter was out of the way, I went ahead and ligated the inferior mesenteric artery pedicle just near its takeoff from the aorta. I did ligate the inferior mesenteric vein in a similar fashion. Then further dissection to help transect the splenic flexure mesentery. However was very wispy with little vascular content. Very little colon mesentery tissue between the distal left middle colic arterial pedicle and the left colic pedicle. Mobilized the descending colon in a lateral medial fashion also the sigmoid colon.  We ensured hemostasis. I skeletonized the mesorectum at the junction at the proximal rectum for the distal point of resection. Chose a region of the mid transverse colon that could reach down to the pelvis. Tissues looked a little purplish but there have been a lot of tattooing everywhere. Because of his atypical anatomy and atypical location with wispy tissues, we decided to test perfusion using firefly We asked anesthesia to dilute the indocyanine green (ICG) to 10 mL and inject 3 mL intravenously with IV flush. I switched to the NIR fluorescence (Firefly mode) imaging window on the daVinci platform. I was able to see good light green visualization of blood vessels with good perfusion of tissues to the distal transverse colon. Also good perfusion of the rectum coming up to the preselected transection point, confirming good tissue perfusion  of both ends planned for anastomosis. I did up transecting some redundant mesentery on the mid transverse colon to the preselected region that easily reached down. I transected at the proximal rectum using a robotic 60 mm green load stapler. I chose a region at the descending/sigmoid junction that was soft and easily reached down to the rectal stump.  I created an extraction incision through a small Pfannenstiel incision including the 12 mm stapler port site in the suprapubic region. Placed a wound protector. I was able to eviscerate the left colon out the wound. I clamped the mid transverse colon using a reusable pursestringer device. Passed a  2-0 Lanny Hurst needle. I transected at the descending/sigmoid junction with a scalpel. I got healthy bleeding mucosa. We sent the rectosigmoid colon specimen off to go to pathology. We sized the colon orifice. I chose a 31 EEA anvil stapler system. I reinforced the prolene pursestring with interrupted silk suture. I placed the anvil to the open end of the proximal remaining colon and closed around it using the pursestring. Return the colon back in the abdomen.  We induced laparoscopic insufflation. I mobilized the small bowel out of the left retroperitoneum to the right side such that the transverse colon could reach down along the left retroperitoneum towards the pelvis without tension. We did copious irrigation with crystalloid solution. Hemostasis was good. The distal end of the remaining colon easily reached down to the rectal stump. Dr Marcello Moores scrubbed down and did gentle anal dilation and advanced the EEA stapler up the rectal stump. The spike was brought out at the provimal end of the rectal stump under direct visualization. I attached the anvil of the proximal colon the spike of the stapler. Anvil was tightened down and held clamped for 60 seconds. The EEA stapler was fired and held clamped for 30 seconds. The stapler was released & removed. We  noted 2 excellent anastomotic rings. Blue stitch is in the proximal ring. Dr Marcello Moores did rigid proctoscopy noted the anastomosis was at 17 cm from the anal verge consistent with the proximal rectum. We did a final irrigation of antibiotic solution (900 mg clindamycin/240 mg gentamicin in a liter of crystalloid) & held that for the pelvic air leak test . The rectum was insufflated the rectum while clamping the colon proximal to that anastomosis. There was a negative air leak test. There was no tension of mesentery or bowel at the anastomosis. Tissues looked viable. Ureters & bowel uninjured. The anastomosis looked healthy.  Endoluminal gas was evacuated. Ports & wound protector removed. We changed gloves & redraped the patient per colon SSI prevention protocol. We aspirated the antibiotic irrigation. Hemostasis was good. Sterile unused instruments were used from this point. I closed the skin at the port sites using Monocryl stitch and sterile dressing. I closed the extraction wound using a 0 Vicryl vertical peritoneal closure and a #1 PDS transverse anterior rectal fascial closure like a small Pfannenstiel closure. I closed the skin with some interrupted Monocryl stitches. I placed sterile dressings.   Patient is being extubated go to recovery room. I had discussed postop care with the patient in detail the office & in the holding area. Instructions are written. I discussed operative findings, updated the patient's status, discussed probable steps to recovery, and gave postoperative recommendations to the patient's spouse, [name]. Recommendations were made. Questions were answered. She expressed understanding & appreciation.    [name], M.D., F.A.C.S. Gastrointestinal and Minimally Invasive Surgery [facility] [address] [place], [place] [address] [phone] Main / Paging   Allergies Emeline Gins, [place]; 03/14/2019 12:13  PM) No Known Drug Allergies [02/15/2016]: Allergies Reconciled  Medication History Emeline Gins, CMA; 03/14/2019 12:13 PM) Benadryl (25MG Tablet, Oral) Active. ProAir Digihaler (108MCG/ACT Aero Pow Br Act, Inhalation) Active. ProAir HFA (108 (90 Base)MCG/ACT Aerosol Soln, Inhalation) Active. Aspirin (81MG Tablet, Oral) Active. OneTouch Ultra Blue (In Vitro) Active. Tums (500MG Tablet Chewable, Oral) Active. Multivitamins/Minerals (Oral) Active. Fish Oil (1000MG Capsule, Oral) Active. Atorvastatin Calcium (10MG Tablet, Oral) Active. Lisinopril (2.5MG Tablet, Oral) Active. Asmanex 30 Metered Doses (220MCG/INH Aero Pow Br Act, Inhalation) Active.  Toprol XL (25MG Tablet ER 24HR, Oral) Active. MetFORMIN HCl (1000MG Tablet, Oral) Active. Repaglinide (1MG Tablet, Oral) Active. Medications Reconciled    Vitals Emeline Gins CMA; 03/14/2019 12:13 PM) 03/14/2019 12:13 PM Weight: 193.8 lb Height: 72in Body Surface Area: 2.1 m Body Mass Index: 26.28 kg/m  Temp.: 97.15F  Pulse: 95 (Regular)  BP: 160/96 (Sitting, Left Arm, Standard)        Physical Exam David Hector MD; 03/14/2019 12:45 PM)  General Mental Status-Alert. General Appearance-Not in acute distress, Not Sickly. Orientation-Oriented X3. Hydration-Well hydrated. Voice-Normal. Note: [place] alert. Moving regularly. No guarding or hesitancy.  Integumentary Global Assessment Upon inspection and palpation of skin surfaces of the - Axillae: non-tender, no inflammation or ulceration, no drainage. and Distribution of scalp and body hair is normal. General Characteristics Temperature - normal warmth is noted.  Head and Neck Head-normocephalic, atraumatic with no lesions or palpable masses. Face Global Assessment - atraumatic, no absence of expression. Neck Global Assessment - no abnormal movements, no bruit auscultated on the right, no bruit auscultated on the left,  no decreased range of motion, non-tender. Trachea-midline. Thyroid Gland Characteristics - non-tender.  Eye Eyeball - Left-Extraocular movements intact, No Nystagmus - Left. Eyeball - Right-Extraocular movements intact, No Nystagmus - Right. Cornea - Left-No Hazy - Left. Cornea - Right-No Hazy - Right. Sclera/Conjunctiva - Left-No scleral icterus, No Discharge - Left. Sclera/Conjunctiva - Right-No scleral icterus, No Discharge - Right. Pupil - Left-Direct reaction to light normal. Pupil - Right-Direct reaction to light normal.  ENMT Ears Pinna - Left - no drainage observed, no generalized tenderness observed. Pinna - Right - no drainage observed, no generalized tenderness observed. Nose and Sinuses External Inspection of the Nose - no destructive lesion observed. Inspection of the nares - Left - quiet respiration. Inspection of the nares - Right - quiet respiration. Mouth and Throat Lips - Upper Lip - no fissures observed, no pallor noted. Lower Lip - no fissures observed, no pallor noted. Nasopharynx - no discharge present. Oral Cavity/Oropharynx - Tongue - no dryness observed. Oral Mucosa - no cyanosis observed. Hypopharynx - no evidence of airway distress observed.  Chest and Lung Exam Inspection Movements - Normal and Symmetrical. Accessory muscles - No use of accessory muscles in breathing. Palpation Palpation of the chest reveals - Non-tender. Auscultation Breath sounds - Normal and Clear.  Cardiovascular Auscultation Rhythm - Regular. Murmurs & Other Heart Sounds - Auscultation of the heart reveals - No Murmurs and No Systolic Clicks.  Abdomen Inspection Inspection of the abdomen reveals - No Visible peristalsis and No Abnormal pulsations. Umbilicus - No Bleeding, No Urine drainage. Palpation/Percussion Palpation and Percussion of the abdomen reveal - Soft, Non Tender, No Rebound tenderness, No Rigidity (guarding) and No Cutaneous hyperesthesia. 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 Unchanged.  Original robotic and Pfannenstiel and dressings on. Removed all those. Some ecchymosis but otherwise closed well. No hernias.  Male Genitourinary Sexual Maturity Tanner 5 - Adult hair pattern and Adult penile size and shape. Note: No recurrent hernias. Normal external male genitalia  Peripheral Vascular Upper Extremity Inspection - Left - No Cyanotic nailbeds - Left, Not Ischemic. Inspection - Right - No Cyanotic nailbeds - Right, Not Ischemic.  Neurologic Neurologic evaluation reveals -normal attention span and ability to concentrate, able to name objects and repeat phrases. Appropriate fund of knowledge , normal sensation and normal coordination. Mental Status Affect - not angry, not  paranoid. Cranial Nerves-Normal Bilaterally. Gait-Normal.  Neuropsychiatric Mental status exam performed with findings of-able to articulate well with normal speech/language, rate, volume and coherence, thought content normal with ability to perform basic computations and apply abstract reasoning and no evidence of hallucinations, delusions, obsessions or homicidal/suicidal ideation.  Musculoskeletal Global Assessment Spine, Ribs and Pelvis - no instability, subluxation or laxity. Right Upper Extremity - no instability, subluxation or laxity.  Lymphatic Head & Neck  General Head & Neck Lymphatics: Bilateral - Description - No Localized lymphadenopathy. Axillary  General Axillary Region: Bilateral - Description - No Localized lymphadenopathy. Femoral & Inguinal  Generalized Femoral & Inguinal Lymphatics: Left - Description - No Localized lymphadenopathy. Right - Description - No Localized lymphadenopathy.    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 The margins negative but question of a positive radial margin. 8/26 lymph nodes positive.  He is recovering remarkably well so far.  Continue nutrition and exercise.  He has seen medical oncology. It was felt he would benefit from post-adjuvant chemotherapy. To place Port-A-Cath in a few days under ultrasound and fluoroscopy. He is here to proceed with the next phase.  We will further discuss at GI tumor board later this week.  We'll be following closely with medical oncology. At this point, he can follow with me as needed. Very low threshold to reevaluate in the office but he seems to be doing quite well so far. Follow-up colonoscopy in one year and usual colorectal cancer survivable pathway. I will see him in 2 days to place the port  Current Plans Use of a central venous catheter for intravenous therapy was discussed. Technique of catheter placement using ultrasound and fluoroscopy guidance was discussed. Risks such as bleeding, infection, pneumothorax, catheter occlusion, reoperation, and other risks were discussed. I noted a good likelihood this will help address the problem. Questions were answered. The patient expressed understanding & wishes to proceed.  SPLENIC MASS (R16.1) Impression: Large splenic mass seen on prior MRI lumbar spine. Radiology favors hemangioma. Nothing suspicious intraoperatively and saphenous splenomegaly. Left alone. Smaller ellipsoid mass suspicious for accessory spleen removed. 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  Soreness, decreased appetite, and poor energy level are common problems after surgery. While many people can struggle with a bad day, these concerns should gradually fade away or at least improve. Much  of your recovery depends on your health & the severity of your operation. Please call if you have any further questions / concerns related to surgery.  Increase activity as tolerated to regular everyday activity. Consider daily low impact exercise every day such as walking an hour a day.  Do not push through pain. If it hurts to do it, then don't do it.  Diet as tolerated. Low fat high fiber diet ideal. 30 g fiber a day ideal. Consider taking a daily fiber supplement to keep your bowels regular.  Followup with your primary care physician for other health issues as would normally be done.  Consider screening for malignancies (breast, prostate, colon, melanoma, etc) as appropriate. Discuss with you primary care physician.  Consider follow up colonoscopy by your gastroenterologist.  Since you had a colorectal cancer resected by surgery, you should strongly consider getting  a colonoscopy by your gastroenterologict in one year after the surgery that removed your cancer. Call your gastroenterologist for advice  Pt Education - [facility] Colorectal Cancer (AT): discussed with patient and provided information. Pt Education - [facility] Good Bowel Health (Bishoy Cupp)  David Hector, MD, FACS, MASCRS Gastrointestinal and Minimally Invasive Surgery  [facility] [address], [place], [place] [address] [phone] Main / Paging [phone] Fax

## 2019-03-15 ENCOUNTER — Encounter (HOSPITAL_COMMUNITY): Payer: Self-pay | Admitting: Surgery

## 2019-03-15 ENCOUNTER — Other Ambulatory Visit: Payer: Self-pay | Admitting: Oncology

## 2019-03-15 ENCOUNTER — Other Ambulatory Visit: Payer: Self-pay

## 2019-03-15 ENCOUNTER — Other Ambulatory Visit: Payer: Self-pay | Admitting: *Deleted

## 2019-03-15 MED ORDER — LIDOCAINE-PRILOCAINE 2.5-2.5 % EX CREA: 1.0000 "application " | TOPICAL_CREAM | CUTANEOUS | 3 refills | Status: DC

## 2019-03-15 MED ORDER — ONDANSETRON HCL 8 MG PO TABS: 8.0000 mg | ORAL_TABLET | Freq: Three times a day (TID) | ORAL | 1 refills | Status: DC | PRN

## 2019-03-15 MED ORDER — BUPIVACAINE LIPOSOME 1.3 % IJ SUSP
20.0000 mL | Freq: Once | INTRAMUSCULAR | Status: DC
  Filled 2019-03-15: qty 20

## 2019-03-15 MED ORDER — BUPIVACAINE LIPOSOME 1.3 % IJ SUSP
20.0000 mL | Freq: Once | INTRAMUSCULAR | Status: DC
  Filled 2019-03-15 (×3): qty 20

## 2019-03-15 MED ORDER — PROCHLORPERAZINE MALEATE 10 MG PO TABS: 10.0000 mg | ORAL_TABLET | Freq: Four times a day (QID) | ORAL | 1 refills | Status: DC | PRN

## 2019-03-15 NOTE — Anesthesia Preprocedure Evaluation (Addendum)
Anesthesia Evaluation  Patient identified by MRN, date of birth, ID band Patient awake    Reviewed: Allergy & Precautions, NPO status , Patient's Chart, lab work & pertinent test results  Airway Mallampati: II  TM Distance: >3 FB Neck ROM: Full    Dental no notable dental hx.    Pulmonary asthma ,    Pulmonary exam normal breath sounds clear to auscultation       Cardiovascular Exercise Tolerance: Good hypertension, Pt. on medications and Pt. on home beta blockers + Valvular Problems/Murmurs MR  Rhythm:Regular Rate:Normal + Diastolic murmurs ECG: NSR, rate 77  ECHO: 1. Left ventricular ejection fraction, by visual estimation, is 60 to 65%. The left ventricle has normal function. Normal left ventricular size. There is no left ventricular hypertrophy. 2. Global right ventricle has normal systolic function.The right ventricular size is normal. No increase in right ventricular wall thickness. 3. Left atrial size was normal. 4. Right atrial size was normal. 5. Moderate thickening of the mitral valve leaflet(s). 6. The mitral valve is myxomatous. Severe mitral valve regurgitation. No evidence of mitral stenosis. 7. Partially flail leaflet of posterior mitral valve. Mitral regurgitation is severe, with jet wrapping around left atrium. 8. The tricuspid valve is myxomatous. Tricuspid valve regurgitation is trivial. 9. The aortic valve is tricuspid Aortic valve regurgitation was not visualized by color flow Doppler. Structurally normal aortic valve, with no evidence of sclerosis or stenosis. 10. The pulmonic valve was normal in structure. Pulmonic valve regurgitation is not visualized by color flow Doppler. 11. There is mild dilatation of the ascending aorta measuring 39 mm. 12. Mildly elevated pulmonary artery systolic pressure. 13. The inferior vena cava is normal in size with greater than 50% respiratory variability, suggesting  right atrial pressure of 3 mmHg.   Pt last seen by cardiology 01/18/2019.  Seen by Richardson Dopp, PA-C.  Per OV note, "The patient's echocardiogram now demonstrates severe mitral regurgitation. I reviewed this with Dr. Irish Lack who contacted the patient by telephone. His mitral valve disease does not prevent him from proceeding with his colon surgery. He will eventually need mitral valve repair at some point in the near future. He may proceed with his colon surgery at acceptable risk. He should have SBE prophylaxis perioperatively."   Neuro/Psych negative neurological ROS  negative psych ROS   GI/Hepatic Neg liver ROS, hiatal hernia,   Endo/Other  diabetes, Oral Hypoglycemic Agents  Renal/GU negative Renal ROS     Musculoskeletal scoliosis   Abdominal   Peds  Hematology negative hematology ROS (+) HLD   Anesthesia Other Findings colon cancer, need for chemotherapy  Reproductive/Obstetrics                         Anesthesia Physical Anesthesia Plan  ASA: III  Anesthesia Plan: General   Post-op Pain Management:    Induction: Intravenous  PONV Risk Score and Plan: 2 and Ondansetron, Dexamethasone, Midazolam and Treatment may vary due to age or medical condition  Airway Management Planned: LMA  Additional Equipment:   Intra-op Plan:   Post-operative Plan: Extubation in OR  Informed Consent: I have reviewed the patients History and Physical, chart, labs and discussed the procedure including the risks, benefits and alternatives for the proposed anesthesia with the patient or authorized representative who has indicated his/her understanding and acceptance.     Dental advisory given  Plan Discussed with: CRNA  Anesthesia Plan Comments: (Reviewed PAT note 03/02/2019, Konrad Felix, PA-C)  Anesthesia Quick Evaluation  

## 2019-03-15 NOTE — Progress Notes (Signed)
PCP - Dr. Orpah Melter Cardiologist - Dr. 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 Patient was also instructed that they will need to review over the PAT instructions again at home before surgery.

## 2019-03-15 NOTE — Progress Notes (Signed)
START ON PATHWAY REGIMEN - Colorectal     A cycle is every 14 days:     Oxaliplatin      Leucovorin      Fluorouracil      Fluorouracil   **Always confirm dose/schedule in your pharmacy ordering system**  Patient Characteristics: Postoperative without Neoadjuvant Therapy (Pathologic Staging), Colon, Stage III, High Risk (pT4 or pN2) Tumor Location: Colon Therapeutic Status: Postoperative without Neoadjuvant Therapy (Pathologic Staging) AJCC M Category: Staged < 8th Ed. AJCC T Category: Staged < 8th Ed. AJCC N Category: Staged < 8th Ed. AJCC 8 Stage Grouping: Staged < 8th Ed. Intent of Therapy: Curative Intent, Discussed with Patient 

## 2019-03-16 ENCOUNTER — Ambulatory Visit (HOSPITAL_COMMUNITY)
Admission: RE | Admit: 2019-03-16 | Discharge: 2019-03-16 | Disposition: A | Discharge status: Home / Self Care | Payer: BC Managed Care – PPO | Attending: Surgery | Admitting: Surgery

## 2019-03-16 ENCOUNTER — Other Ambulatory Visit: Payer: Self-pay

## 2019-03-16 ENCOUNTER — Ambulatory Visit (HOSPITAL_COMMUNITY): Payer: BC Managed Care – PPO | Admitting: Physician Assistant

## 2019-03-16 ENCOUNTER — Inpatient Hospital Stay: Payer: BC Managed Care – PPO

## 2019-03-16 ENCOUNTER — Encounter (HOSPITAL_COMMUNITY): Payer: Self-pay | Admitting: Surgery

## 2019-03-16 ENCOUNTER — Ambulatory Visit (HOSPITAL_COMMUNITY): Payer: BC Managed Care – PPO

## 2019-03-16 ENCOUNTER — Encounter (HOSPITAL_COMMUNITY)
Admission: RE | Disposition: A | Discharge status: Home / Self Care | Payer: Self-pay | Source: Home / Self Care | Attending: Surgery

## 2019-03-16 DIAGNOSIS — Z8249 Family history of ischemic heart disease and other diseases of the circulatory system: Secondary | ICD-10-CM | POA: Insufficient documentation

## 2019-03-16 DIAGNOSIS — I10 Essential (primary) hypertension: Secondary | ICD-10-CM | POA: Diagnosis not present

## 2019-03-16 DIAGNOSIS — Z905 Acquired absence of kidney: Secondary | ICD-10-CM | POA: Diagnosis not present

## 2019-03-16 DIAGNOSIS — C189 Malignant neoplasm of colon, unspecified: Secondary | ICD-10-CM | POA: Diagnosis not present

## 2019-03-16 DIAGNOSIS — I340 Nonrheumatic mitral (valve) insufficiency: Secondary | ICD-10-CM | POA: Diagnosis not present

## 2019-03-16 DIAGNOSIS — Z7951 Long term (current) use of inhaled steroids: Secondary | ICD-10-CM | POA: Diagnosis not present

## 2019-03-16 DIAGNOSIS — E785 Hyperlipidemia, unspecified: Secondary | ICD-10-CM | POA: Insufficient documentation

## 2019-03-16 DIAGNOSIS — Z95828 Presence of other vascular implants and grafts: Secondary | ICD-10-CM

## 2019-03-16 DIAGNOSIS — Z800 Family history of malignant neoplasm of digestive organs: Secondary | ICD-10-CM | POA: Diagnosis not present

## 2019-03-16 DIAGNOSIS — Z79899 Other long term (current) drug therapy: Secondary | ICD-10-CM | POA: Diagnosis not present

## 2019-03-16 DIAGNOSIS — C188 Malignant neoplasm of overlapping sites of colon: Secondary | ICD-10-CM | POA: Diagnosis not present

## 2019-03-16 DIAGNOSIS — C19 Malignant neoplasm of rectosigmoid junction: Secondary | ICD-10-CM | POA: Diagnosis not present

## 2019-03-16 DIAGNOSIS — Z85528 Personal history of other malignant neoplasm of kidney: Secondary | ICD-10-CM | POA: Diagnosis not present

## 2019-03-16 DIAGNOSIS — J45909 Unspecified asthma, uncomplicated: Secondary | ICD-10-CM | POA: Insufficient documentation

## 2019-03-16 DIAGNOSIS — E119 Type 2 diabetes mellitus without complications: Secondary | ICD-10-CM | POA: Diagnosis not present

## 2019-03-16 DIAGNOSIS — M419 Scoliosis, unspecified: Secondary | ICD-10-CM | POA: Diagnosis not present

## 2019-03-16 DIAGNOSIS — G9340 Encephalopathy, unspecified: Secondary | ICD-10-CM | POA: Insufficient documentation

## 2019-03-16 DIAGNOSIS — K449 Diaphragmatic hernia without obstruction or gangrene: Secondary | ICD-10-CM | POA: Diagnosis not present

## 2019-03-16 DIAGNOSIS — Z9049 Acquired absence of other specified parts of digestive tract: Secondary | ICD-10-CM | POA: Insufficient documentation

## 2019-03-16 DIAGNOSIS — Z5111 Encounter for antineoplastic chemotherapy: Secondary | ICD-10-CM | POA: Diagnosis not present

## 2019-03-16 DIAGNOSIS — Z85850 Personal history of malignant neoplasm of thyroid: Secondary | ICD-10-CM | POA: Diagnosis not present

## 2019-03-16 DIAGNOSIS — Z7984 Long term (current) use of oral hypoglycemic drugs: Secondary | ICD-10-CM | POA: Insufficient documentation

## 2019-03-16 DIAGNOSIS — Z452 Encounter for adjustment and management of vascular access device: Secondary | ICD-10-CM | POA: Diagnosis not present

## 2019-03-16 DIAGNOSIS — Z7982 Long term (current) use of aspirin: Secondary | ICD-10-CM | POA: Diagnosis not present

## 2019-03-16 HISTORY — PX: PORTACATH PLACEMENT: SHX2246

## 2019-03-16 LAB — CBC
HCT: 44.3 % (ref 39.0–52.0)
Hemoglobin: 15.2 g/dL (ref 13.0–17.0)
MCH: 31.9 pg (ref 26.0–34.0)
MCHC: 34.3 g/dL (ref 30.0–36.0)
MCV: 92.9 fL (ref 80.0–100.0)
Platelets: 203 10*3/uL (ref 150–400)
RBC: 4.77 MIL/uL (ref 4.22–5.81)
RDW: 12.2 % (ref 11.5–15.5)
WBC: 5.5 10*3/uL (ref 4.0–10.5)
nRBC: 0 % (ref 0.0–0.2)

## 2019-03-16 LAB — BASIC METABOLIC PANEL
Anion gap: 12 (ref 5–15)
BUN: 15 mg/dL (ref 6–20)
CO2: 24 mmol/L (ref 22–32)
Calcium: 9.2 mg/dL (ref 8.9–10.3)
Chloride: 102 mmol/L (ref 98–111)
Creatinine, Ser: 0.96 mg/dL (ref 0.61–1.24)
GFR calc Af Amer: >60 mL/min (ref >60)
GFR calc non Af Amer: >60 mL/min (ref >60)
Glucose, Bld: 163 mg/dL — ABNORMAL HIGH (ref 70–99)
Potassium: 3.8 mmol/L (ref 3.5–5.1)
Sodium: 138 mmol/L (ref 135–145)

## 2019-03-16 LAB — GLUCOSE, CAPILLARY
Glucose-Capillary: 158 mg/dL — ABNORMAL HIGH (ref 70–99)
Glucose-Capillary: 118 mg/dL — ABNORMAL HIGH (ref 70–99)

## 2019-03-16 SURGERY — INSERTION, TUNNELED CENTRAL VENOUS DEVICE, WITH PORT
Anesthesia: General | Site: Chest

## 2019-03-16 MED ORDER — CHLORHEXIDINE GLUCONATE CLOTH 2 % EX PADS: 6.0000 | MEDICATED_PAD | Freq: Once | CUTANEOUS | Status: DC

## 2019-03-16 MED ORDER — GABAPENTIN 300 MG PO CAPS
300.0000 mg | ORAL_CAPSULE | ORAL | Status: AC
  Administered 2019-03-16: 300 mg via ORAL
  Filled 2019-03-16: qty 1

## 2019-03-16 MED ORDER — BUPIVACAINE HCL (PF) 0.5 % IJ SOLN
INTRAMUSCULAR | Status: DC | PRN
  Administered 2019-03-16: 30 mL

## 2019-03-16 MED ORDER — DEXAMETHASONE SODIUM PHOSPHATE 10 MG/ML IJ SOLN
INTRAMUSCULAR | Status: AC
  Filled 2019-03-16: qty 1

## 2019-03-16 MED ORDER — ACETAMINOPHEN 500 MG PO TABS
1000.0000 mg | ORAL_TABLET | ORAL | Status: AC
  Administered 2019-03-16: 1000 mg via ORAL
  Filled 2019-03-16: qty 2

## 2019-03-16 MED ORDER — 0.9 % SODIUM CHLORIDE (POUR BTL) OPTIME
TOPICAL | Status: DC | PRN
  Administered 2019-03-16: 10:00:00 1000 mL

## 2019-03-16 MED ORDER — FENTANYL CITRATE (PF) 100 MCG/2ML IJ SOLN
INTRAMUSCULAR | Status: AC
  Filled 2019-03-16: qty 2

## 2019-03-16 MED ORDER — ONDANSETRON HCL 4 MG/2ML IJ SOLN
INTRAMUSCULAR | Status: DC | PRN
  Administered 2019-03-16: 4 mg via INTRAVENOUS

## 2019-03-16 MED ORDER — PROPOFOL 10 MG/ML IV BOLUS
INTRAVENOUS | Status: DC | PRN
  Administered 2019-03-16: 200 mg via INTRAVENOUS

## 2019-03-16 MED ORDER — ONDANSETRON HCL 4 MG/2ML IJ SOLN
INTRAMUSCULAR | Status: AC
  Filled 2019-03-16: qty 2

## 2019-03-16 MED ORDER — HYDROMORPHONE HCL 1 MG/ML IJ SOLN: 0.2500 mg | INTRAMUSCULAR | Status: DC | PRN

## 2019-03-16 MED ORDER — LIDOCAINE 2% (20 MG/ML) 5 ML SYRINGE
INTRAMUSCULAR | Status: AC
  Filled 2019-03-16: qty 5

## 2019-03-16 MED ORDER — MIDAZOLAM HCL 5 MG/5ML IJ SOLN
INTRAMUSCULAR | Status: DC | PRN
  Administered 2019-03-16: 2 mg via INTRAVENOUS

## 2019-03-16 MED ORDER — PROMETHAZINE HCL 25 MG/ML IJ SOLN: 6.2500 mg | INTRAMUSCULAR | Status: DC | PRN

## 2019-03-16 MED ORDER — HEPARIN SOD (PORK) LOCK FLUSH 100 UNIT/ML IV SOLN
INTRAVENOUS | Status: DC | PRN
  Administered 2019-03-16: 500 [IU]

## 2019-03-16 MED ORDER — OXYCODONE HCL 5 MG PO TABS: 5.0000 mg | ORAL_TABLET | Freq: Once | ORAL | Status: DC | PRN

## 2019-03-16 MED ORDER — SODIUM CHLORIDE 0.9 % IV SOLN
Freq: Once | INTRAVENOUS | Status: AC
  Administered 2019-03-16: 500 mL
  Filled 2019-03-16: qty 1.2

## 2019-03-16 MED ORDER — FENTANYL CITRATE (PF) 100 MCG/2ML IJ SOLN
INTRAMUSCULAR | Status: DC | PRN
  Administered 2019-03-16 (×2): 50 ug via INTRAVENOUS

## 2019-03-16 MED ORDER — OXYCODONE HCL 5 MG/5ML PO SOLN: 5.0000 mg | Freq: Once | ORAL | Status: DC | PRN

## 2019-03-16 MED ORDER — LACTATED RINGERS IV SOLN: INTRAVENOUS | Status: DC

## 2019-03-16 MED ORDER — LIDOCAINE HCL (CARDIAC) PF 100 MG/5ML IV SOSY
PREFILLED_SYRINGE | INTRAVENOUS | Status: DC | PRN
  Administered 2019-03-16: 60 mg via INTRAVENOUS
  Administered 2019-03-16: 40 mg via INTRAVENOUS

## 2019-03-16 MED ORDER — DEXAMETHASONE SODIUM PHOSPHATE 10 MG/ML IJ SOLN
INTRAMUSCULAR | Status: DC | PRN
  Administered 2019-03-16: 10 mg via INTRAVENOUS

## 2019-03-16 MED ORDER — CEFAZOLIN SODIUM-DEXTROSE 2-4 GM/100ML-% IV SOLN
2.0000 g | INTRAVENOUS | Status: AC
  Administered 2019-03-16: 2 g via INTRAVENOUS
  Filled 2019-03-16: qty 100

## 2019-03-16 MED ORDER — HEPARIN SOD (PORK) LOCK FLUSH 100 UNIT/ML IV SOLN
INTRAVENOUS | Status: AC
  Filled 2019-03-16: qty 5

## 2019-03-16 MED ORDER — PROPOFOL 10 MG/ML IV BOLUS
INTRAVENOUS | Status: AC
  Filled 2019-03-16: qty 20

## 2019-03-16 MED ORDER — MIDAZOLAM HCL 2 MG/2ML IJ SOLN
INTRAMUSCULAR | Status: AC
  Filled 2019-03-16: qty 2

## 2019-03-16 SURGICAL SUPPLY — 30 items
BAG DECANTER FOR FLEXI CONT (MISCELLANEOUS) ×3 IMPLANT
BLADE SURG SZ11 CARB STEEL (BLADE) ×3 IMPLANT
CHLORAPREP W/TINT 26 (MISCELLANEOUS) ×3 IMPLANT
COVER PROBE U/S 5X48 (MISCELLANEOUS) ×3 IMPLANT
COVER SURGICAL LIGHT HANDLE (MISCELLANEOUS) ×3 IMPLANT
COVER WAND RF STERILE (DRAPES) IMPLANT
DECANTER SPIKE VIAL GLASS SM (MISCELLANEOUS) ×3 IMPLANT
DRAPE C-ARM 42X120 X-RAY (DRAPES) ×3 IMPLANT
DRAPE LAPAROTOMY T 98X78 PEDS (DRAPES) ×3 IMPLANT
DRSG TEGADERM 2-3/8X2-3/4 SM (GAUZE/BANDAGES/DRESSINGS) IMPLANT
DRSG TEGADERM 4X4.75 (GAUZE/BANDAGES/DRESSINGS) IMPLANT
ELECT REM PT RETURN 15FT ADLT (MISCELLANEOUS) ×3 IMPLANT
GAUZE 4X4 16PLY RFD (DISPOSABLE) ×3 IMPLANT
GAUZE SPONGE 2X2 8PLY STRL LF (GAUZE/BANDAGES/DRESSINGS) ×1 IMPLANT
GLOVE ECLIPSE 8.0 STRL XLNG CF (GLOVE) ×3 IMPLANT
GLOVE INDICATOR 8.0 STRL GRN (GLOVE) ×3 IMPLANT
GOWN STRL REUS W/TWL XL LVL3 (GOWN DISPOSABLE) ×6 IMPLANT
KIT BASIN OR (CUSTOM PROCEDURE TRAY) ×3 IMPLANT
KIT PORT POWER 8FR ISP CVUE (Port) ×2 IMPLANT
KIT TURNOVER KIT A (KITS) IMPLANT
NEEDLE HYPO 22GX1.5 SAFETY (NEEDLE) ×3 IMPLANT
PACK BASIC VI WITH GOWN DISP (CUSTOM PROCEDURE TRAY) ×3 IMPLANT
PENCIL SMOKE EVACUATOR (MISCELLANEOUS) IMPLANT
SPONGE GAUZE 2X2 STER 10/PKG (GAUZE/BANDAGES/DRESSINGS) ×2
SUT MNCRL AB 4-0 PS2 18 (SUTURE) ×5 IMPLANT
SUT PROLENE 2 0 SH DA (SUTURE) ×8 IMPLANT
SYR 10ML LL (SYRINGE) ×3 IMPLANT
SYR 20ML LL LF (SYRINGE) ×3 IMPLANT
TOWEL OR 17X26 10 PK STRL BLUE (TOWEL DISPOSABLE) ×3 IMPLANT
TOWEL OR NON WOVEN STRL DISP B (DISPOSABLE) ×3 IMPLANT

## 2019-03-16 NOTE — Transfer of Care (Signed)
Immediate Anesthesia Transfer of Care Note  Patient: [name]  Procedure(s) Performed: INSERTION PORT-A-CATH (N/A Chest)  Patient Location: PACU  Anesthesia Type:General  Level of Consciousness: drowsy, patient cooperative and responds to stimulation  Airway & Oxygen Therapy: Patient Spontanous Breathing and Patient connected to face mask oxygen  Post-op Assessment: Report given to RN and Post -op Vital signs reviewed and stable  Post vital signs: Reviewed and stable  Last Vitals:  Vitals Value Taken Time  BP 142/95 03/16/19 1206  Temp    Pulse 74 03/16/19 1207  Resp 23 03/16/19 1207  SpO2 100 % 03/16/19 1207  Vitals shown include unvalidated device data.  Last Pain:  Vitals:   03/16/19 0828  TempSrc:   PainSc: 0-No pain      Patients Stated Pain Goal: 4 ([id number])  Complications: No apparent anesthesia complications

## 2019-03-16 NOTE — Anesthesia Procedure Notes (Signed)
Procedure Name: LMA Insertion Date/Time: 03/16/2019 11:03 AM Performed by: Glory Buff, CRNA Pre-anesthesia Checklist: Patient identified, Emergency Drugs available, Suction available and Patient being monitored Patient Re-evaluated:Patient Re-evaluated prior to induction Oxygen Delivery Method: Circle system utilized Preoxygenation: Pre-oxygenation with 100% oxygen Induction Type: IV induction LMA: LMA inserted LMA Size: 4.0 Number of attempts: 1 Placement Confirmation: positive ETCO2 Tube secured with: Tape Dental Injury: Teeth and Oropharynx as per pre-operative assessment

## 2019-03-16 NOTE — Discharge Instructions (Signed)
PORT-A-CATHETER PLACEMENT:  INSTRUCTIONS AFTER SURGERY  The Chemotherapy / Oncology nurse will remove your waterproof bandages in their office a few days after surgery.  Do not remove the bandages unless you are at least 5 days out from surgery or as otherwise instructed  DIET: Follow a light bland diet the first night after arrival home, such as soup, liquids, crackers, etc.  Be sure to include lots of fluids daily.  Avoid fast food or heavy meals as your are more likely to get nauseated.    Take your usually prescribed home medications unless otherwise directed.  PAIN CONTROL: Pain is best controlled by a usual combination of three different methods TOGETHER: Ice/Heat Over the counter acetaminophen pain medication Prescription pain medication  Most patients will experience some swelling and bruising around the incisions.  Ice packs or heating pads (30-60 minutes up to 6 times a day) will help. Use ice for the first few days to help decrease swelling and bruising, then switch to heat to help relax tight/sore spots and speed recovery.  Some people prefer to use ice alone, heat alone, alternating between ice & heat.  Experiment to what works for you.  Swelling and bruising can take several weeks to resolve.    It is helpful to take an over-the-counter pain medication regularly for the first few weeks.  Using acetaminophen (Tylenol, etc) 500-650mg four times a day (every meal & bedtime) is usually safest since NSAIDs like ibuprofen are not advisable in patients with kidney disease.  A  prescription for pain medication (such as oxycodone, hydrocodone, etc) may be given to you upon discharge.  Take your pain medication as prescribed.   If you are having problems/concerns with the prescription medicine (does not control pain, nausea, vomiting, rash, itching, etc), please call us [phone] to see if we need to switch you to a different pain medicine that will work better for you and/or control  your side effect better. If you need a refill on your pain medication, please contact your pharmacy.  They will contact our office to request authorization. Prescriptions will not be filled after 5 pm or on week-ends.  Avoid getting constipated.   Between the surgery and the pain medications, it is common to experience some constipation.  Increasing fluid intake and taking a fiber supplement (such as Metamucil, Citrucel, FiberCon, MiraLax, etc) 1-2 times a day regularly will usually help prevent this problem from occurring.  A mild laxative (prune juice, Milk of Magnesia, MiraLax, etc) should be taken according to package directions if there are no bowel movements after 48 hours.    Wash / shower every day.   You may shower over the dressings as they are waterproof.  Continue to shower over incision(s) after the dressing is off.  The Chemotherapy / Oncology nurse will remove your waterproof bandages in their office a few days after surgery.  Do not remove the bandages unless you are 5 days out from surgery  ACTIVITIES as tolerated:   You may resume regular (light) daily activities beginning the next day--such as daily self-care, walking, climbing stairs--gradually increasing activities as tolerated.  If you can walk 30 minutes without difficulty, it is safe to try more intense activity such as jogging, treadmill, bicycling, low-impact aerobics, swimming, etc. Save the most intensive and strenuous activity for last such as push-ups, heavy lifting, contact sports, etc  Refrain from any heavy lifting or straining until you are off narcotics for pain control.    DO NOT PUSH   THROUGH PAIN.  Let pain be your guide: If it hurts to do something, don't do it.  Pain is your body warning you to avoid that activity for another week until the pain goes down. You may drive when you are no longer taking prescription pain medication, you can comfortably wear a seatbelt, and you can safely maneuver your car and apply  brakes. You may have sexual intercourse when it is comfortable.   FOLLOW UP with the Oncology / Chemotherapy nurses closely after surgery. Please call [facility] at [phone] only as needed.  The infusion nurses & Oncology usually follow you closely, making the need for follow-up in our office redundant and therefore not needed.  If they or you have concerns, please call us for possible follow-up in our office 9. IF YOU HAVE DISABILITY OR FAMILY LEAVE FORMS, BRING THEM TO THE OFFICE FOR PROCESSING.  DO NOT GIVE THEM TO YOUR DOCTOR.   WHEN TO CALL US [phone]: Poor pain control Reactions / problems with new medications (rash/itching, nausea, etc)  Fever over 101.5 F (38.5 C) Worsening swelling or bruising Continued bleeding from incision. Increased pain, redness, or drainage from the incision   The clinic staff is available to answer your questions during regular business hours (8:30am-5pm).  Please don't hesitate to call and ask to speak to one of our nurses for clinical concerns.   If you have a medical emergency, go to the nearest emergency room or call 911.  A surgeon from [facility] is always on call at the hospitals   [facility] [address], [address], [place], [place]  [address] ? MAIN: [phone] ? TOLL FREE: [phone] ?  FAX [phone] [email or web address]  

## 2019-03-16 NOTE — Interval H&P Note (Signed)
History and Physical Interval Note:  03/16/2019 10:40 AM  [name]  has presented today for surgery, with the diagnosis of colon cancer, need for chemotherapy.  The various methods of treatment have been discussed with the patient and family. After consideration of risks, benefits and other options for treatment, the patient has consented to  Procedure(s): INSERTION PORT-A-CATH WITH ULTRASOUND AND FLUROSCOPE GUIDANCE (N/A) as a surgical intervention.  The patient's history has been reviewed, patient examined, no change in status, stable for surgery.  I have reviewed the patient's chart and labs.  Questions were answered to the patient's satisfaction.    I have re-reviewed the the patient's records, history, medications, and allergies.  I have re-examined the patient.  I again discussed intraoperative plans and goals of post-operative recovery.  The patient agrees to proceed.  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 03/04/2019   Genetic testing 02/08/2019   Cancer of descending colon s/p robotic left hemicolectomy 03/04/2019 01/26/2019   History of thyroid cancer 01/26/2019   Multiple lipomas 01/26/2019   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   Family history of colon cancer    Family history of thyroid cancer    Screen for colon cancer 01/07/2019   Diabetes mellitus ([place]) 01/11/2015   Fever 08/17/2014   Discitis    Mitral  regurgitation 07/06/2014   Discitis of lumbar region 03/08/2014   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[place])    Enterococcal bacteremia 03/07/2014   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   Solitary kidney, acquired 03/07/2014   Acute encephalopathy 03/07/2014   Acute bacterial endocarditis    Malnutrition of moderate degree ([place]) 03/01/2014   CVA (cerebral infarction)- embolic [id number]   Acute respiratory failure with hypoxia ([place])    Severe sepsis ([place])    Back pain 02/27/2014   Intractable back pain 02/27/2014   Hyperglycemia 02/27/2014   Hyperlipidemia 02/27/2014   Lipomatosis, 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   Family history of ischemic heart disease 04/05/2013   HYPERTENSION, BENIGN 03/12/2008   PALPITATIONS 03/12/2008    Past Medical History:  Diagnosis Date   Allergic rhinitis    Asthma    animal dander & seasonal asthma   Colon cancer ([place]) 01/26/2019   Diabetes mellitus without complication ([place])    dx 2010  type 2   Discitis of lumbosacral region    first started with this ..and rolled onto the endocarditis    stayed a month   Endocarditis of mitral valve    11/2013 from back strep infection   Family history of colon cancer    Family history of thyroid cancer    H/O scoliosis    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   History of hiatal hernia    Hyperlipidemia    Hypertension    Diagnostic exercise tolerance test assessment:04/30/2010 : comments normal -no evidence os ischemia by ST analysis   lipoma    Lipoma 01/26/2019   Mitral regurgitation    Echo 12/2018: EF 60-65, myxomatous mitral valve, severe mitral regurgitation, partial flail leaflet of  posterior mitral valve, myxomatous tricuspid valve with trivial TR, ascending aorta mildly dilated (39 mm)   Pneumonia    PONV (postoperative nausea and vomiting)    Solitary kidney    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    Past Surgical History:  Procedure Laterality Date   BIOPSY   01/07/2019   Procedure: BIOPSY;  Surgeon: Wilford Corner, MD;  Location: [facility];  Service: Endoscopy;;   COLON SURGERY  03/04/2019   left colon segmental resection   COLONOSCOPY WITH PROPOFOL N/A 01/07/2019   Procedure: COLONOSCOPY WITH PROPOFOL;  Surgeon: Wilford Corner, MD;  Location: [facility];  Service: Endoscopy;  Laterality: N/A;   INGUINAL HERNIA REPAIR Left 04/23/2016   Procedure: LAPAROSCOPIC  INGUINAL REPAIR;  Surgeon: Clovis Riley, MD;  Location: [facility];  Service: General;  Laterality: Left;   KNEE SURGERY     arthroscopic left knee   LIPOMA EXCISION  2015   x20 [facility]   SUBMUCOSAL INJECTION  01/07/2019   Procedure: SUBMUCOSAL INJECTION;  Surgeon: Wilford Corner, MD;  Location: [facility];  Service: Endoscopy;;   THYROID SURGERY  2010   TONSILLECTOMY     TOTAL NEPHRECTOMY Left 1970   Wilms Tumor left kidney excised as an infant    Social History   Socioeconomic History   Marital status: Married    Spouse name: Not on file   Number of children: Not on file   Years of education: Not on file   Highest education level: Not on file  Occupational History   Occupation: executive  Tobacco Use   Smoking status: Never Smoker   Smokeless tobacco: Never Used  Substance and Sexual Activity   Alcohol use: Yes    Alcohol/week: 1.0 standard drinks    Types: 1 Glasses of wine per week    Comment: occasional   Drug use: No   Sexual activity: Yes  Other Topics Concern   Not on file  Social History Narrative   Never smoked    Alcohol yes, rare ,1-2 per week   No recreational drugs   Occupation-  Geneticist, molecular and other executive courses   Marital status - married     Children 1 boy         Social Determinants of Radio broadcast assistant Strain:    Difficulty of Paying Living Expenses: Not on file  Food Insecurity:    Worried About Charity fundraiser in the Last Year: Not on file   [employer] of Food in the Last Year: Not on file   Transportation Needs:    Lack of Transportation (Medical): Not on file   Lack of Transportation (Non-Medical): Not on file  Physical Activity:    Days of Exercise per Week: Not on file   Minutes of Exercise per Session: Not on file  Stress:    Feeling of Stress : Not on file  Social Connections:    Frequency of Communication with Friends and Family: Not on file   Frequency of Social Gatherings with Friends and Family: Not on file   Attends Religious Services: Not on file   Active Member of Clubs or Organizations: Not on file   Attends Archivist Meetings: Not on file   Marital Status: Not on file  Intimate Partner Violence:    Fear of Current or Ex-Partner: Not on file   Emotionally Abused: Not on file   Physically Abused: Not on file  Sexually Abused: Not on file    Family History  Problem Relation Age of Onset   Heart disease Father    Hernia Father    Healthy Sister    Stroke Paternal Grandmother    Cancer Mother        THYROID   Hypotension Mother    Heart attack Maternal Grandfather    Congestive Heart Failure Maternal Grandfather    Heart attack Paternal Grandfather    Colon cancer Maternal Aunt 79   Congestive Heart Failure Paternal Uncle    Congestive Heart Failure Maternal Grandmother    Colon cancer Other 30       MGMs brother   Colon cancer Other        MGFs brother    Medications Prior to Admission  Medication Sig Dispense Refill Last Dose   albuterol (PROVENTIL HFA;VENTOLIN HFA) 108 (90 BASE) MCG/ACT inhaler Inhale 1 puff into the lungs every 6 (six) hours as needed for wheezing or shortness of breath.   Past Month at Unknown time   aspirin EC 81 MG tablet Take 81 mg by mouth every evening.    03/14/2019   atorvastatin (LIPITOR) 10 MG tablet Take 1 tablet (10 mg total) by mouth daily. 30 tablet 1 03/16/2019 at 0700   calcium carbonate (TUMS - DOSED IN MG ELEMENTAL CALCIUM) 500 MG chewable tablet Chew 1 tablet by mouth daily as needed for  indigestion or heartburn.   03/15/2019 at Unknown time   lisinopril (PRINIVIL,ZESTRIL) 2.5 MG tablet Take 1 tablet (2.5 mg total) by mouth daily. 30 tablet 1 03/15/2019 at Unknown time   metFORMIN (GLUCOPHAGE) 1000 MG tablet Take 1,000 mg by mouth 2 (two) times daily.    03/15/2019 at Unknown time   mometasone (ASMANEX) 220 MCG/INH inhaler Inhale 1 puff into the lungs daily.   03/16/2019 at 0700   Multiple Vitamin (MULTIVITAMIN WITH MINERALS) TABS tablet Take 1 tablet by mouth daily.    Past Month at Unknown time   Omega-3 Fatty Acids (FISH OIL) 1000 MG CAPS Take 1,000 mg by mouth daily.    03/15/2019 at Unknown time   repaglinide (PRANDIN) 1 MG tablet Take 1 mg by mouth 2 (two) times daily before a meal.    03/15/2019 at Unknown time   TOPROL XL 25 MG 24 hr tablet Take 25 mg by mouth daily.    03/16/2019 at 0700   lidocaine-prilocaine (EMLA) cream Apply 1 application topically as directed. Apply to port 1 hour prior to stick and cover with plastic wrap 30 g 3    ondansetron (ZOFRAN) 8 MG tablet Take 1 tablet (8 mg total) by mouth every 8 (eight) hours as needed for nausea or vomiting. Begin 72 hours after IV chemo treatment 30 tablet 1    ONE TOUCH ULTRA TEST test strip 1 each by Other route as needed (GLUCOSE).    Unknown at Unknown time   prochlorperazine (COMPAZINE) 10 MG tablet Take 1 tablet (10 mg total) by mouth every 6 (six) hours as needed for nausea. 60 tablet 1    traMADol (ULTRAM) 50 MG tablet Take 1-2 tablets (50-100 mg total) by mouth every 6 (six) hours as needed for moderate pain or severe pain. (Patient not taking: Reported on 03/10/2019) 20 tablet 0     Current Facility-Administered Medications  Medication Dose Route Frequency Provider Last Rate Last Admin   0.9 % irrigation (POUR BTL)    PRN Michael Boston, MD   1,000 mL at 03/16/19 1023  bupivacaine liposome (EXPAREL) 1.3 % injection 266 mg  20 mL Infiltration Once Michael Boston, MD       ceFAZolin (ANCEF) IVPB 2g/100 mL premix   2 g Intravenous On Call to OR Michael Boston, MD       Chlorhexidine Gluconate Cloth 2 % PADS 6 each  6 each Topical Once Michael Boston, MD       And   Chlorhexidine Gluconate Cloth 2 % PADS 6 each  6 each Topical Once Michael Boston, MD       heparin 6,000 Units in sodium chloride 0.9 % 500 mL irrigation   Irrigation Once Michael Boston, MD       heparin lock flush 100 unit/mL    PRN Michael Boston, MD   500 Units at 03/16/19 1023   lactated ringers infusion   Intravenous Continuous Ellender, Karyl Kinnier, MD 50 mL/hr at 03/16/19 0831 New Bag at 03/16/19 0831     No Known Allergies  BP (!) 156/100 (BP Location: Right Arm)   Pulse 81   Temp 98.7 F (37.1 C) (Oral)   Resp 18   Ht 6' (1.829 m)   Wt 86.8 kg   SpO2 100%   BMI 25.96 kg/m   Labs: Results for orders placed or performed during the hospital encounter of 03/16/19 (from the past 48 hour(s))  Glucose, capillary     Status: Abnormal   Collection Time: 03/16/19  8:05 AM  Result Value Ref Range   Glucose-Capillary 158 (H) 70 - 99 mg/dL  CBC     Status: None   Collection Time: 03/16/19  8:30 AM  Result Value Ref Range   WBC 5.5 4.0 - 10.5 K/uL   RBC 4.77 4.22 - 5.81 MIL/uL   Hemoglobin 15.2 13.0 - 17.0 g/dL   HCT 44.3 39.0 - 52.0 %   MCV 92.9 80.0 - 100.0 fL   MCH 31.9 26.0 - 34.0 pg   MCHC 34.3 30.0 - 36.0 g/dL   RDW 12.2 11.5 - 15.5 %   Platelets 203 150 - 400 K/uL   nRBC 0.0 0.0 - 0.2 %    Comment: Performed at [facility], [place] [address] [place], [place] [id number]  Basic metabolic panel     Status: Abnormal   Collection Time: 03/16/19  8:30 AM  Result Value Ref Range   Sodium 138 135 - 145 mmol/L   Potassium 3.8 3.5 - 5.1 mmol/L   Chloride 102 98 - 111 mmol/L   CO2 24 22 - 32 mmol/L   Glucose, Bld 163 (H) 70 - 99 mg/dL   BUN 15 6 - 20 mg/dL   Creatinine, Ser 0.96 0.61 - 1.24 mg/dL   Calcium 9.2 8.9 - 10.3 mg/dL   GFR calc non Af Amer >60 >60 mL/min   GFR calc Af Amer >60 >60 mL/min   Anion gap  12 5 - 15    Comment: Performed at [facility], [place] [address], [place], [place] [address]    Imaging / Studies: No results found.   [name], M.D., F.A.C.S. Gastrointestinal and Minimally Invasive Surgery [facility] [address] [place],  [address] [phone] Main / Paging  03/16/2019 10:40 AM     [name]

## 2019-03-16 NOTE — Op Note (Addendum)
03/16/2019  12:05 PM  PATIENT:  [name]  53 y.o. male  Patient Care Team: Orpah Melter, MD as PCP - General (Family Medicine) Jettie Booze, MD as PCP - Cardiology (Cardiology) Jettie Booze, MD as Consulting Physician (Interventional Cardiology) Michael Boston, MD as Consulting Physician (Colon and Rectal Surgery) Wilford Corner, MD as Consulting Physician (Gastroenterology) Ladell Pier, MD as Consulting Physician (Oncology)  PRE-OPERATIVE DIAGNOSIS:  colon cancer, need for chemotherapy  POST-OPERATIVE DIAGNOSIS:  colon cancer, need for chemotherapy  PROCEDURE:  Procedure(s): INSERTION PORT-A-CATH  SURGEON:  [name], MD  ASSISTANT: OR Staff   ANESTHESIA:   local and MAC  EBL:  Total I/O In: -  Out: 20 [Blood:20]  Delay start of Pharmacological VTE agent (>24hrs) due to surgical blood loss or risk of bleeding:  no  DRAINS: none   SPECIMEN:  No Specimen  DISPOSITION OF SPECIMEN:  N/A  COUNTS:  YES  PLAN OF CARE: Discharge to home after PACU  PATIENT DISPOSITION:  PACU - hemodynamically stable.  INDICATION: Patient with need for IV therapy.  Stage III colorectal cancer felt to benefit from post adjuvant chemotherapy.   Port-A-Cath placement was requested.  Use of a central venous catheter for intravenous therapy was discussed.  Technique of catheter placement using ultrasound and fluoroscopy guidance was discussed.  Risks such as bleeding, infection, pneumothorax, catheter occlusion, reoperation, and other risks were discussed.   I noted a good likelihood this will help address the problem.  Questions were answered.  The patient expressed understanding & wishes to proceed.  OR FINDINGS: Normal-appearing anatomy.  Is an 8 [place] power port. It goes through the right internal jugular vein  DESCRIPTION:   Procedure: Informed consent was confirmed. Patient was brought the operating room. and positioned supine. Arms were tucked.  The patient underwent deep sedation. Neck and chest were clipped and prepped and draped in a sterile fashion. A surgical timeout confirmed our plan.  I placed a field block of local anesthesia on the neck & chest  I used the ultrasound to identify the right internal jugular vein. I entered into it using on the first venipuncture under direct ultrasound guidance. Wire was passed into the inferior vena cava by fluoroscopy.  I made an incision in the lateral infraclavicular pocket. Made a subcutaneous pocket. I tunneled the power port from the chest wound to the neck puncture site. The port was secured to the left anterior chest wall using 2-0 Prolene interrupted stitches x4. Catheter flushed well.  I used a dilator on the wire using Seldinger technique to dilate the neck tract. Then I used a dilator with a peel away sheath.  We used fluoroscopy.  I pulled the wire back into the right atrial/superior vena cava junction.  I pulled the wire back until it was at the neck puncture site. This gave the true measurement of the intravenous segment. I cut the catheter to appropriate length. I removed the wire and dilator. The catheter was placed within the sheath. The sheath was peeled away.  Fluoroscopy confirmed the tip in the proximal RA.  I pulled the catheter back to be at the SVC/RA junction.  Catheter aspirated and flushed well. On final fluoro reevaluation the tip seen to be in good position in the distal SVC/RA junction.    I closed the wounds using 4 Monocryl stitch & placed sterile dressings. Patient should go home later today. Catheter is okay to use.  I discussed operative findings, updated the  patient's status, discussed probable steps to recovery, and gave postoperative recommendations to the patient's spouse.  Recommendations were made.  Questions were answered.  She expressed understanding & appreciation. \  [name], M.D., F.A.C.S. Gastrointestinal and Minimally Invasive Surgery [facility] [address], [place], [place] [address] [phone] Main / Paging

## 2019-03-17 ENCOUNTER — Inpatient Hospital Stay: Payer: BC Managed Care – PPO

## 2019-03-17 ENCOUNTER — Encounter: Payer: Self-pay | Admitting: *Deleted

## 2019-03-17 DIAGNOSIS — Z5111 Encounter for antineoplastic chemotherapy: Secondary | ICD-10-CM | POA: Diagnosis not present

## 2019-03-17 DIAGNOSIS — C182 Malignant neoplasm of ascending colon: Secondary | ICD-10-CM

## 2019-03-17 DIAGNOSIS — C184 Malignant neoplasm of transverse colon: Secondary | ICD-10-CM | POA: Diagnosis not present

## 2019-03-17 DIAGNOSIS — C779 Secondary and unspecified malignant neoplasm of lymph node, unspecified: Secondary | ICD-10-CM | POA: Diagnosis not present

## 2019-03-17 LAB — CBC WITH DIFFERENTIAL (CANCER CENTER ONLY)
Abs Immature Granulocytes: 0.05 10*3/uL (ref 0.00–0.07)
Basophils Absolute: 0.1 10*3/uL (ref 0.0–0.1)
Basophils Relative: 0 %
Eosinophils Absolute: 0.1 10*3/uL (ref 0.0–0.5)
Eosinophils Relative: 1 %
HCT: 42.1 % (ref 39.0–52.0)
Hemoglobin: 14.9 g/dL (ref 13.0–17.0)
Immature Granulocytes: 0 %
Lymphocytes Relative: 14 %
Lymphs Abs: 1.5 10*3/uL (ref 0.7–4.0)
MCH: 32.4 pg (ref 26.0–34.0)
MCHC: 35.4 g/dL (ref 30.0–36.0)
MCV: 91.5 fL (ref 80.0–100.0)
Monocytes Absolute: 1.2 10*3/uL — ABNORMAL HIGH (ref 0.1–1.0)
Monocytes Relative: 11 %
Neutro Abs: 8.3 10*3/uL — ABNORMAL HIGH (ref 1.7–7.7)
Neutrophils Relative %: 74 %
Platelet Count: 205 10*3/uL (ref 150–400)
RBC: 4.6 MIL/uL (ref 4.22–5.81)
RDW: 12.3 % (ref 11.5–15.5)
WBC Count: 11.3 10*3/uL — ABNORMAL HIGH (ref 4.0–10.5)
nRBC: 0 % (ref 0.0–0.2)

## 2019-03-17 LAB — CMP (CANCER CENTER ONLY)
ALT: 21 U/L (ref 0–44)
AST: 17 U/L (ref 15–41)
Albumin: 4 g/dL (ref 3.5–5.0)
Alkaline Phosphatase: 45 U/L (ref 38–126)
Anion gap: 13 (ref 5–15)
BUN: 17 mg/dL (ref 6–20)
CO2: 25 mmol/L (ref 22–32)
Calcium: 9.1 mg/dL (ref 8.9–10.3)
Chloride: 102 mmol/L (ref 98–111)
Creatinine: 1.06 mg/dL (ref 0.61–1.24)
GFR, Est AFR Am: >60 mL/min (ref >60)
GFR, Estimated: >60 mL/min (ref >60)
Glucose, Bld: 136 mg/dL — ABNORMAL HIGH (ref 70–99)
Potassium: 4.4 mmol/L (ref 3.5–5.1)
Sodium: 140 mmol/L (ref 135–145)
Total Bilirubin: 0.9 mg/dL (ref 0.3–1.2)
Total Protein: 7.3 g/dL (ref 6.5–8.1)

## 2019-03-17 NOTE — Anesthesia Postprocedure Evaluation (Signed)
Anesthesia Post Note  Patient: [name]  Procedure(s) Performed: INSERTION PORT-A-CATH (N/A Chest)     Patient location during evaluation: PACU Anesthesia Type: General Level of consciousness: awake and alert Pain management: pain level controlled Vital Signs Assessment: post-procedure vital signs reviewed and stable Respiratory status: spontaneous breathing, nonlabored ventilation, respiratory function stable and patient connected to nasal cannula oxygen Cardiovascular status: blood pressure returned to baseline and stable Postop Assessment: no apparent nausea or vomiting Anesthetic complications: no    Last Vitals:  Vitals:   03/16/19 1230 03/16/19 1245  BP: (!) 138/91 (!) 141/91  Pulse: 70 71  Resp: 14 11  Temp:  36.8 C  SpO2: 95% 99%    Last Pain:  Vitals:   03/16/19 1230  TempSrc:   PainSc: 0-No pain                 Jamelia Varano P Saud Bail

## 2019-03-18 ENCOUNTER — Other Ambulatory Visit: Payer: Self-pay

## 2019-03-18 ENCOUNTER — Ambulatory Visit (HOSPITAL_COMMUNITY)
Admission: RE | Admit: 2019-03-18 | Discharge: 2019-03-18 | Disposition: A | Discharge status: Home / Self Care | Payer: BC Managed Care – PPO | Source: Ambulatory Visit | Attending: Oncology | Admitting: Oncology

## 2019-03-18 DIAGNOSIS — J479 Bronchiectasis, uncomplicated: Secondary | ICD-10-CM | POA: Diagnosis not present

## 2019-03-18 DIAGNOSIS — I700 Atherosclerosis of aorta: Secondary | ICD-10-CM | POA: Diagnosis not present

## 2019-03-18 DIAGNOSIS — C182 Malignant neoplasm of ascending colon: Secondary | ICD-10-CM | POA: Diagnosis not present

## 2019-03-18 DIAGNOSIS — D7389 Other diseases of spleen: Secondary | ICD-10-CM | POA: Diagnosis not present

## 2019-03-18 DIAGNOSIS — C189 Malignant neoplasm of colon, unspecified: Secondary | ICD-10-CM | POA: Diagnosis not present

## 2019-03-20 ENCOUNTER — Other Ambulatory Visit: Payer: Self-pay | Admitting: Oncology

## 2019-03-21 ENCOUNTER — Inpatient Hospital Stay: Payer: BC Managed Care – PPO

## 2019-03-21 ENCOUNTER — Encounter: Payer: Self-pay | Admitting: Nurse Practitioner

## 2019-03-21 ENCOUNTER — Inpatient Hospital Stay: Payer: BC Managed Care – PPO | Admitting: Nurse Practitioner

## 2019-03-21 ENCOUNTER — Other Ambulatory Visit: Payer: Self-pay

## 2019-03-21 ENCOUNTER — Encounter: Payer: Self-pay | Admitting: Oncology

## 2019-03-21 VITALS — BP 148/94 | HR 77 | Temp 98.5°F | Resp 17 | Ht 72.0 in | Wt 191.2 lb

## 2019-03-21 DIAGNOSIS — C186 Malignant neoplasm of descending colon: Secondary | ICD-10-CM

## 2019-03-21 DIAGNOSIS — Z5111 Encounter for antineoplastic chemotherapy: Secondary | ICD-10-CM | POA: Diagnosis not present

## 2019-03-21 DIAGNOSIS — C182 Malignant neoplasm of ascending colon: Secondary | ICD-10-CM | POA: Diagnosis not present

## 2019-03-21 DIAGNOSIS — C779 Secondary and unspecified malignant neoplasm of lymph node, unspecified: Secondary | ICD-10-CM | POA: Diagnosis not present

## 2019-03-21 DIAGNOSIS — C184 Malignant neoplasm of transverse colon: Secondary | ICD-10-CM | POA: Diagnosis not present

## 2019-03-21 MED ORDER — DEXTROSE 5 % IV SOLN
Freq: Once | INTRAVENOUS | Status: AC
  Filled 2019-03-21: qty 250

## 2019-03-21 MED ORDER — OXALIPLATIN CHEMO INJECTION 100 MG/20ML
85.0000 mg/m2 | Freq: Once | INTRAVENOUS | Status: AC
  Administered 2019-03-21: 180 mg via INTRAVENOUS
  Filled 2019-03-21: qty 36

## 2019-03-21 MED ORDER — SODIUM CHLORIDE 0.9 % IV SOLN
2400.0000 mg/m2 | INTRAVENOUS | Status: DC
  Administered 2019-03-21: 5000 mg via INTRAVENOUS
  Filled 2019-03-21: qty 100

## 2019-03-21 MED ORDER — FLUOROURACIL CHEMO INJECTION 2.5 GM/50ML
400.0000 mg/m2 | Freq: Once | INTRAVENOUS | Status: AC
  Administered 2019-03-21: 850 mg via INTRAVENOUS
  Filled 2019-03-21: qty 17

## 2019-03-21 MED ORDER — DEXAMETHASONE SODIUM PHOSPHATE 10 MG/ML IJ SOLN
10.0000 mg | Freq: Once | INTRAMUSCULAR | Status: AC
  Administered 2019-03-21: 10 mg via INTRAVENOUS

## 2019-03-21 MED ORDER — SODIUM CHLORIDE 0.9 % IV SOLN: 10.0000 mg | Freq: Once | INTRAVENOUS | Status: DC

## 2019-03-21 MED ORDER — DEXAMETHASONE SODIUM PHOSPHATE 10 MG/ML IJ SOLN
INTRAMUSCULAR | Status: AC
  Filled 2019-03-21: qty 1

## 2019-03-21 MED ORDER — LEUCOVORIN CALCIUM INJECTION 350 MG
400.0000 mg/m2 | Freq: Once | INTRAVENOUS | Status: AC
  Administered 2019-03-21: 836 mg via INTRAVENOUS
  Filled 2019-03-21: qty 41.8

## 2019-03-21 MED ORDER — PALONOSETRON HCL INJECTION 0.25 MG/5ML
0.2500 mg | Freq: Once | INTRAVENOUS | Status: AC
  Administered 2019-03-21: 13:00:00 0.25 mg via INTRAVENOUS

## 2019-03-21 MED ORDER — PALONOSETRON HCL INJECTION 0.25 MG/5ML
INTRAVENOUS | Status: AC
  Filled 2019-03-21: qty 5

## 2019-03-21 NOTE — Progress Notes (Signed)
Met with patient to introduce myself as Financial Resource Specialist and to offer available resources. ° °Discussed one-time $700 [facility] grant and qualifications to assist with personal expenses while going through treatment.  ° °Gave him my card if interested in applying and for any additional financial questions or concerns.     °

## 2019-03-21 NOTE — Patient Instructions (Signed)
[facility] Discharge Instructions for Patients Receiving Chemotherapy  Today you received the following chemotherapy agents: Oxaliplatin (Eloxatin), Leucovorin, Fluorouracil (Adrucil, 5-FU)  To help prevent nausea and vomiting after your treatment, we encourage you to take your nausea medication as directed. Received Aloxi during your treatment today. For the next 3 days use your Compazine prescription as needed for break through nausea. Starting Thursday you can add your Zofran prescription into your regimen if Compazine isn't enough.   If you develop nausea and vomiting that is not controlled by your nausea medication, call the clinic.   BELOW ARE SYMPTOMS THAT SHOULD BE REPORTED IMMEDIATELY:  *FEVER GREATER THAN 100.5 F  *CHILLS WITH OR WITHOUT FEVER  NAUSEA AND VOMITING THAT IS NOT CONTROLLED WITH YOUR NAUSEA MEDICATION  *UNUSUAL SHORTNESS OF BREATH  *UNUSUAL BRUISING OR BLEEDING  TENDERNESS IN MOUTH AND THROAT WITH OR WITHOUT PRESENCE OF ULCERS  *URINARY PROBLEMS  *BOWEL PROBLEMS  UNUSUAL RASH Items with * indicate a potential emergency and should be followed up as soon as possible.  Feel free to call the clinic should you have any questions or concerns. The clinic phone number is [phone].  Please show the [facility] at check-in to the Emergency Department and triage nurse.  Oxaliplatin Injection What is this medicine? OXALIPLATIN (ox AL i PLA tin) is a chemotherapy drug. It targets fast dividing cells, like cancer cells, and causes these cells to die. This medicine is used to treat cancers of the colon and rectum, and many other cancers. This medicine may be used for other purposes; ask your health care provider or pharmacist if you have questions. COMMON BRAND NAME(S): Eloxatin What should I tell my health care provider before I take this medicine? They need to know if you have any of these conditions:  kidney disease  an unusual or  allergic reaction to oxaliplatin, other chemotherapy, other medicines, foods, dyes, or preservatives  pregnant or trying to get pregnant  breast-feeding How should I use this medicine? This drug is given as an infusion into a vein. It is administered in a hospital or clinic by a specially trained health care professional. Talk to your pediatrician regarding the use of this medicine in children. Special care may be needed. Overdosage: If you think you have taken too much of this medicine contact a poison control center or emergency room at once. NOTE: This medicine is only for you. Do not share this medicine with others. What if I miss a dose? It is important not to miss a dose. Call your doctor or health care professional if you are unable to keep an appointment. What may interact with this medicine?  medicines to increase blood counts like filgrastim, pegfilgrastim, sargramostim  probenecid  some antibiotics like amikacin, gentamicin, neomycin, polymyxin B, streptomycin, tobramycin  zalcitabine Talk to your doctor or health care professional before taking any of these medicines:  acetaminophen  aspirin  ibuprofen  ketoprofen  naproxen This list may not describe all possible interactions. Give your health care provider a list of all the medicines, herbs, non-prescription drugs, or dietary supplements you use. Also tell them if you smoke, drink alcohol, or use illegal drugs. Some items may interact with your medicine. What should I watch for while using this medicine? Your condition will be monitored carefully while you are receiving this medicine. You will need important blood work done while you are taking this medicine. This medicine can make you more sensitive to cold. Do not drink  cold drinks or use ice. Cover exposed skin before coming in contact with cold temperatures or cold objects. When out in cold weather wear warm clothing and cover your mouth and nose to warm the air  that goes into your lungs. Tell your doctor if you get sensitive to the cold. This drug may make you feel generally unwell. This is not uncommon, as chemotherapy can affect healthy cells as well as cancer cells. Report any side effects. Continue your course of treatment even though you feel ill unless your doctor tells you to stop. In some cases, you may be given additional medicines to help with side effects. Follow all directions for their use. Call your doctor or health care professional for advice if you get a fever, chills or sore throat, or other symptoms of a cold or flu. Do not treat yourself. This drug decreases your body's ability to fight infections. Try to avoid being around people who are sick. This medicine may increase your risk to bruise or bleed. Call your doctor or health care professional if you notice any unusual bleeding. Be careful brushing and flossing your teeth or using a toothpick because you may get an infection or bleed more easily. If you have any dental work done, tell your dentist you are receiving this medicine. Avoid taking products that contain aspirin, acetaminophen, ibuprofen, naproxen, or ketoprofen unless instructed by your doctor. These medicines may hide a fever. Do not become pregnant while taking this medicine. Women should inform their doctor if they wish to become pregnant or think they might be pregnant. There is a potential for serious side effects to an unborn child. Talk to your health care professional or pharmacist for more information. Do not breast-feed an infant while taking this medicine. Call your doctor or health care professional if you get diarrhea. Do not treat yourself. What side effects may I notice from receiving this medicine? Side effects that you should report to your doctor or health care professional as soon as possible:  allergic reactions like skin rash, itching or hives, swelling of the face, lips, or tongue  low blood counts - This  drug may decrease the number of white blood cells, red blood cells and platelets. You may be at increased risk for infections and bleeding.  signs of infection - fever or chills, cough, sore throat, pain or difficulty passing urine  signs of decreased platelets or bleeding - bruising, pinpoint red spots on the skin, black, tarry stools, nosebleeds  signs of decreased red blood cells - unusually weak or tired, fainting spells, lightheadedness  breathing problems  chest pain, pressure  cough  diarrhea  jaw tightness  mouth sores  nausea and vomiting  pain, swelling, redness or irritation at the injection site  pain, tingling, numbness in the hands or feet  problems with balance, talking, walking  redness, blistering, peeling or loosening of the skin, including inside the mouth  trouble passing urine or change in the amount of urine Side effects that usually do not require medical attention (report to your doctor or health care professional if they continue or are bothersome):  changes in vision  constipation  hair loss  loss of appetite  metallic taste in the mouth or changes in taste  stomach pain This list may not describe all possible side effects. Call your doctor for medical advice about side effects. You may report side effects to FDA at 1-800-FDA-1088. Where should I keep my medicine? This drug is given in a hospital  or clinic and will not be stored at home. NOTE: This sheet is a summary. It may not cover all possible information. If you have questions about this medicine, talk to your doctor, pharmacist, or health care provider.  2020 Elsevier/Gold Standard (2007-10-12 17:22:47)  Leucovorin injection What is this medicine? LEUCOVORIN (loo koe VOR in) is used to prevent or treat the harmful effects of some medicines. This medicine is used to treat anemia caused by a low amount of folic acid in the body. It is also used with 5-fluorouracil (5-FU) to treat  colon cancer. This medicine may be used for other purposes; ask your health care provider or pharmacist if you have questions. What should I tell my health care provider before I take this medicine? They need to know if you have any of these conditions:  anemia from low levels of vitamin B-12 in the blood  an unusual or allergic reaction to leucovorin, folic acid, other medicines, foods, dyes, or preservatives  pregnant or trying to get pregnant  breast-feeding How should I use this medicine? This medicine is for injection into a muscle or into a vein. It is given by a health care professional in a hospital or clinic setting. Talk to your pediatrician regarding the use of this medicine in children. Special care may be needed. Overdosage: If you think you have taken too much of this medicine contact a poison control center or emergency room at once. NOTE: This medicine is only for you. Do not share this medicine with others. What if I miss a dose? This does not apply. What may interact with this medicine?  capecitabine  fluorouracil  phenobarbital  phenytoin  primidone  trimethoprim-sulfamethoxazole This list may not describe all possible interactions. Give your health care provider a list of all the medicines, herbs, non-prescription drugs, or dietary supplements you use. Also tell them if you smoke, drink alcohol, or use illegal drugs. Some items may interact with your medicine. What should I watch for while using this medicine? Your condition will be monitored carefully while you are receiving this medicine. This medicine may increase the side effects of 5-fluorouracil, 5-FU. Tell your doctor or health care professional if you have diarrhea or mouth sores that do not get better or that get worse. What side effects may I notice from receiving this medicine? Side effects that you should report to your doctor or health care professional as soon as possible:  allergic reactions  like skin rash, itching or hives, swelling of the face, lips, or tongue  breathing problems  fever, infection  mouth sores  unusual bleeding or bruising  unusually weak or tired Side effects that usually do not require medical attention (report to your doctor or health care professional if they continue or are bothersome):  constipation or diarrhea  loss of appetite  nausea, vomiting This list may not describe all possible side effects. Call your doctor for medical advice about side effects. You may report side effects to FDA at 1-800-FDA-1088. Where should I keep my medicine? This drug is given in a hospital or clinic and will not be stored at home. NOTE: This sheet is a summary. It may not cover all possible information. If you have questions about this medicine, talk to your doctor, pharmacist, or health care provider.  2020 Elsevier/Gold Standard (2007-09-21 16:50:29)  Fluorouracil, 5-FU injection What is this medicine? FLUOROURACIL, 5-FU (flure oh YOOR a sil) is a chemotherapy drug. It slows the growth of cancer cells. This medicine is  used to treat many types of cancer like breast cancer, colon or rectal cancer, pancreatic cancer, and stomach cancer. This medicine may be used for other purposes; ask your health care provider or pharmacist if you have questions. COMMON BRAND NAME(S): Adrucil What should I tell my health care provider before I take this medicine? They need to know if you have any of these conditions:  blood disorders  dihydropyrimidine dehydrogenase (DPD) deficiency  infection (especially a virus infection such as chickenpox, cold sores, or herpes)  kidney disease  liver disease  malnourished, poor nutrition  recent or ongoing radiation therapy  an unusual or allergic reaction to fluorouracil, other chemotherapy, other medicines, foods, dyes, or preservatives  pregnant or trying to get pregnant  breast-feeding How should I use this  medicine? This drug is given as an infusion or injection into a vein. It is administered in a hospital or clinic by a specially trained health care professional. Talk to your pediatrician regarding the use of this medicine in children. Special care may be needed. Overdosage: If you think you have taken too much of this medicine contact a poison control center or emergency room at once. NOTE: This medicine is only for you. Do not share this medicine with others. What if I miss a dose? It is important not to miss your dose. Call your doctor or health care professional if you are unable to keep an appointment. What may interact with this medicine?  allopurinol  cimetidine  dapsone  digoxin  hydroxyurea  leucovorin  levamisole  medicines for seizures like ethotoin, fosphenytoin, phenytoin  medicines to increase blood counts like filgrastim, pegfilgrastim, sargramostim  medicines that treat or prevent blood clots like warfarin, enoxaparin, and dalteparin  methotrexate  metronidazole  pyrimethamine  some other chemotherapy drugs like busulfan, cisplatin, estramustine, vinblastine  trimethoprim  trimetrexate  vaccines Talk to your doctor or health care professional before taking any of these medicines:  acetaminophen  aspirin  ibuprofen  ketoprofen  naproxen This list may not describe all possible interactions. Give your health care provider a list of all the medicines, herbs, non-prescription drugs, or dietary supplements you use. Also tell them if you smoke, drink alcohol, or use illegal drugs. Some items may interact with your medicine. What should I watch for while using this medicine? Visit your doctor for checks on your progress. This drug may make you feel generally unwell. This is not uncommon, as chemotherapy can affect healthy cells as well as cancer cells. Report any side effects. Continue your course of treatment even though you feel ill unless your doctor  tells you to stop. In some cases, you may be given additional medicines to help with side effects. Follow all directions for their use. Call your doctor or health care professional for advice if you get a fever, chills or sore throat, or other symptoms of a cold or flu. Do not treat yourself. This drug decreases your body's ability to fight infections. Try to avoid being around people who are sick. This medicine may increase your risk to bruise or bleed. Call your doctor or health care professional if you notice any unusual bleeding. Be careful brushing and flossing your teeth or using a toothpick because you may get an infection or bleed more easily. If you have any dental work done, tell your dentist you are receiving this medicine. Avoid taking products that contain aspirin, acetaminophen, ibuprofen, naproxen, or ketoprofen unless instructed by your doctor. These medicines may hide a fever. Do not  become pregnant while taking this medicine. Women should inform their doctor if they wish to become pregnant or think they might be pregnant. There is a potential for serious side effects to an unborn child. Talk to your health care professional or pharmacist for more information. Do not breast-feed an infant while taking this medicine. Men should inform their doctor if they wish to father a child. This medicine may lower sperm counts. Do not treat diarrhea with over the counter products. Contact your doctor if you have diarrhea that lasts more than 2 days or if it is severe and watery. This medicine can make you more sensitive to the sun. Keep out of the sun. If you cannot avoid being in the sun, wear protective clothing and use sunscreen. Do not use sun lamps or tanning beds/booths. What side effects may I notice from receiving this medicine? Side effects that you should report to your doctor or health care professional as soon as possible:  allergic reactions like skin rash, itching or hives, swelling of  the face, lips, or tongue  low blood counts - this medicine may decrease the number of white blood cells, red blood cells and platelets. You may be at increased risk for infections and bleeding.  signs of infection - fever or chills, cough, sore throat, pain or difficulty passing urine  signs of decreased platelets or bleeding - bruising, pinpoint red spots on the skin, black, tarry stools, blood in the urine  signs of decreased red blood cells - unusually weak or tired, fainting spells, lightheadedness  breathing problems  changes in vision  chest pain  mouth sores  nausea and vomiting  pain, swelling, redness at site where injected  pain, tingling, numbness in the hands or feet  redness, swelling, or sores on hands or feet  stomach pain  unusual bleeding Side effects that usually do not require medical attention (report to your doctor or health care professional if they continue or are bothersome):  changes in finger or toe nails  diarrhea  dry or itchy skin  hair loss  headache  loss of appetite  sensitivity of eyes to the light  stomach upset  unusually teary eyes This list may not describe all possible side effects. Call your doctor for medical advice about side effects. You may report side effects to FDA at 1-800-FDA-1088. Where should I keep my medicine? This drug is given in a hospital or clinic and will not be stored at home. NOTE: This sheet is a summary. It may not cover all possible information. If you have questions about this medicine, talk to your doctor, pharmacist, or health care provider.  2020 Elsevier/Gold Standard (2007-07-21 13:53:16)  Coronavirus (COVID-19) Are you at risk?  Are you at risk for the Coronavirus (COVID-19)?  To be considered HIGH RISK for Coronavirus (COVID-19), you have to meet the following criteria:  . Traveled to [place], [place], [place], [place] or [place]; or in the [place] to [place], [place], [place], or [place]; and have fever, cough, and shortness of breath within the last 2 weeks of travel OR . Been in close contact with a person diagnosed with COVID-19 within the last 2 weeks and have fever, cough, and shortness of breath . IF YOU DO NOT MEET THESE CRITERIA, YOU ARE CONSIDERED LOW RISK FOR COVID-19.  What to do if you are HIGH RISK for COVID-19?  Marland Kitchen If you are having a medical emergency, call 911. . Seek medical care right away.  Before you go to a doctor's office, urgent care or emergency department, call ahead and tell them about your recent travel, contact with someone diagnosed with COVID-19, and your symptoms. You should receive instructions from your physician's office regarding next steps of care.  . When you arrive at healthcare provider, tell the healthcare staff immediately you have returned from visiting [place], [place], [place], [place] or [place]; or traveled in the [place] to [place], [place], [place], or [place]; in the last two weeks or you have been in close contact with a person diagnosed with COVID-19 in the last 2 weeks.   . Tell the health care staff about your symptoms: fever, cough and shortness of breath. . After you have been seen by a medical provider, you will be either: o Tested for (COVID-19) and discharged home on quarantine except to seek medical care if symptoms worsen, and asked to  - Stay home and avoid contact with others until you get your results (4-5 days)  - Avoid travel on public transportation if possible (such as bus, train, or airplane) or o Sent to the Emergency Department by EMS for evaluation, COVID-19 testing, and possible admission depending on your condition and test results.  What to do if you are LOW RISK for COVID-19?  Reduce your risk of any infection by using the same precautions used for avoiding the common cold or flu:  Marland Kitchen Wash your hands often with soap and warm water for at least 20 seconds.  If soap and water are  not readily available, use an alcohol-based hand sanitizer with at least 60% alcohol.  . If coughing or sneezing, cover your mouth and nose by coughing or sneezing into the elbow areas of your shirt or coat, into a tissue or into your sleeve (not your hands). . Avoid shaking hands with others and consider head nods or verbal greetings only. . Avoid touching your eyes, nose, or mouth with unwashed hands.  . Avoid close contact with people who are sick. . Avoid places or events with large numbers of people in one location, like concerts or sporting events. . Carefully consider travel plans you have or are making. . If you are planning any travel outside or inside the [place], visit the CDC's Travelers' Health webpage for the latest health notices. . If you have some symptoms but not all symptoms, continue to monitor at home and seek medical attention if your symptoms worsen. . If you are having a medical emergency, call 911.   [place] / e-Visit: [email or web address]         [facility]: [facility]: [id number]                   [facility]: [phone]

## 2019-03-21 NOTE — Progress Notes (Addendum)
[facility] OFFICE PROGRESS NOTE   Diagnosis: Colon cancer  INTERVAL HISTORY:   Mr. Feild returns as scheduled.  He denies abdominal pain.  The port site is "sore".  Bowels are moving.  No bleeding.  He has mild intermittent nausea.  Toes on the left foot with intermittent numbness.  He attributes the numbness to diabetes and a knee injury.  Objective:  Vital signs in last 24 hours:  Blood pressure (!) 148/94, pulse 77, temperature 98.5 F (36.9 C), temperature source Temporal, resp. rate 17, height 6' (1.829 m), weight 191 lb 3.2 oz (86.7 kg), SpO2 98 %.    HEENT: No thrush or ulcers. GI: Abdomen soft and nontender.  No hepatomegaly.  Healed surgical incisions. Vascular: No leg edema.  Skin: Palms dry appearing.  No erythema. Port-A-Cath without erythema.  Lab Results:  Lab Results  Component Value Date   WBC 11.3 (H) 03/17/2019   HGB 14.9 03/17/2019   HCT 42.1 03/17/2019   MCV 91.5 03/17/2019   PLT 205 03/17/2019   NEUTROABS 8.3 (H) 03/17/2019    Imaging:  No results found.  Medications: I have reviewed the patient's current medications.  Assessment/Plan: 1. 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 Mass felt to be in the transverse colon on a colonoscopy 01/07/2019 with a biopsy confirming poorly differentiated adenocarcinoma with signet ring cell features, mismatch repair protein expression normal ? CT abdomen/pelvis 01/08/2019 12.3 cm indeterminate splenic mass, present in 2015 on a lumbar MRI-felt to be benign, small subpleural and pleural-based nodules at the lung bases, status post left nephrectomy ? CT chest 01/20/2019-small bilateral pulmonary nodules with rounded parenchymal nodules in the right lower lobe, indeterminate splenic mass ? CT chest 03/18/2019-multiple subcentimeter pulmonary nodules again seen  bilaterally, greatest in the lower lobes, without significant change.  No new or enlarging pulmonary nodules or masses.  Partially visualized large heterogeneous splenic mass with no significant change.  Plan for follow-up chest CT at a 2-monthinterval.  2. Multiple colon polyps-ascending colon polyps on the colonoscopy 01/07/2019-not removed 3. 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 Lumbar discitis 2015 7. Diabetes 8. Asthma 9. Multiple lipomas 10. Family history of colon cancer  [facility] panel 2020-POLE VUS 11.  Left nephrectomy at age [age]  Port-A-Cath placement, Dr. GJohney Maine 03/16/2019   Disposition: Mr. JTewsappears stable.  He is scheduled to begin adjuvant FOLFOX.  We again reviewed potential toxicities.  He has attended the chemotherapy education class.  He agrees to proceed.  Plan to proceed with cycle 1 today as scheduled.  We reviewed the recent chest CT result showing no significant change in the lung nodules.  Plan for repeat chest CT at a 323-monthnterval.  He will return for lab, follow-up, cycle 2 FOLFOX in 2 weeks.  He will contact the office in the interim with any problems.  Patient seen with Dr. ShBenay Spice LiNed CardNP/GNP-BC   03/21/2019  11:34 AM  This was a shared visit with LiNed Card Mr. JoReitterill begin adjuvant FOLFOX today.  His case was presented at the GI tumor conference last week.  The repeat chest CT reveals no change in lung nodules.  We will follow the lung nodules with repeat imaging.  BrJulieanne MansonMD

## 2019-03-22 ENCOUNTER — Encounter (HOSPITAL_COMMUNITY): Payer: Self-pay

## 2019-03-22 ENCOUNTER — Telehealth: Payer: Self-pay | Admitting: *Deleted

## 2019-03-22 ENCOUNTER — Telehealth: Payer: Self-pay | Admitting: Oncology

## 2019-03-22 NOTE — Telephone Encounter (Signed)
Scheduled per los. Called and left msg. mailed printout  

## 2019-03-23 ENCOUNTER — Other Ambulatory Visit: Payer: Self-pay

## 2019-03-23 ENCOUNTER — Inpatient Hospital Stay: Payer: BC Managed Care – PPO

## 2019-03-23 VITALS — BP 150/87 | HR 68 | Temp 98.7°F | Resp 20

## 2019-03-23 DIAGNOSIS — Z5111 Encounter for antineoplastic chemotherapy: Secondary | ICD-10-CM | POA: Diagnosis not present

## 2019-03-23 DIAGNOSIS — C184 Malignant neoplasm of transverse colon: Secondary | ICD-10-CM | POA: Diagnosis not present

## 2019-03-23 DIAGNOSIS — C186 Malignant neoplasm of descending colon: Secondary | ICD-10-CM

## 2019-03-23 DIAGNOSIS — C779 Secondary and unspecified malignant neoplasm of lymph node, unspecified: Secondary | ICD-10-CM | POA: Diagnosis not present

## 2019-03-23 MED ORDER — HEPARIN SOD (PORK) LOCK FLUSH 100 UNIT/ML IV SOLN
500.0000 [IU] | Freq: Once | INTRAVENOUS | Status: AC | PRN
  Administered 2019-03-23: 500 [IU]
  Filled 2019-03-23: qty 5

## 2019-03-23 MED ORDER — SODIUM CHLORIDE 0.9% FLUSH
10.0000 mL | INTRAVENOUS | Status: DC | PRN
  Administered 2019-03-23: 10 mL
  Filled 2019-03-23: qty 10

## 2019-03-23 NOTE — Patient Instructions (Signed)

## 2019-04-01 ENCOUNTER — Other Ambulatory Visit: Payer: Self-pay | Admitting: Oncology

## 2019-04-04 ENCOUNTER — Inpatient Hospital Stay: Payer: BC Managed Care – PPO | Admitting: Oncology

## 2019-04-04 ENCOUNTER — Inpatient Hospital Stay: Payer: BC Managed Care – PPO

## 2019-04-04 ENCOUNTER — Other Ambulatory Visit: Payer: Self-pay

## 2019-04-04 ENCOUNTER — Inpatient Hospital Stay: Payer: BC Managed Care – PPO | Attending: Genetic Counselor

## 2019-04-04 VITALS — BP 143/95 | HR 82 | Temp 98.0°F | Resp 17 | Ht 72.0 in | Wt 198.0 lb

## 2019-04-04 DIAGNOSIS — Z5111 Encounter for antineoplastic chemotherapy: Secondary | ICD-10-CM | POA: Diagnosis not present

## 2019-04-04 DIAGNOSIS — C186 Malignant neoplasm of descending colon: Secondary | ICD-10-CM

## 2019-04-04 DIAGNOSIS — C184 Malignant neoplasm of transverse colon: Secondary | ICD-10-CM | POA: Diagnosis not present

## 2019-04-04 DIAGNOSIS — C182 Malignant neoplasm of ascending colon: Secondary | ICD-10-CM

## 2019-04-04 DIAGNOSIS — Z95828 Presence of other vascular implants and grafts: Secondary | ICD-10-CM

## 2019-04-04 LAB — CBC WITH DIFFERENTIAL (CANCER CENTER ONLY)
Abs Immature Granulocytes: 0.02 10*3/uL (ref 0.00–0.07)
Basophils Absolute: 0.1 10*3/uL (ref 0.0–0.1)
Basophils Relative: 1 %
Eosinophils Absolute: 0.3 10*3/uL (ref 0.0–0.5)
Eosinophils Relative: 5 %
HCT: 42.3 % (ref 39.0–52.0)
Hemoglobin: 14.8 g/dL (ref 13.0–17.0)
Immature Granulocytes: 0 %
Lymphocytes Relative: 17 %
Lymphs Abs: 1.1 10*3/uL (ref 0.7–4.0)
MCH: 32.4 pg (ref 26.0–34.0)
MCHC: 35 g/dL (ref 30.0–36.0)
MCV: 92.6 fL (ref 80.0–100.0)
Monocytes Absolute: 0.7 10*3/uL (ref 0.1–1.0)
Monocytes Relative: 12 %
Neutro Abs: 4.2 10*3/uL (ref 1.7–7.7)
Neutrophils Relative %: 65 %
Platelet Count: 124 10*3/uL — ABNORMAL LOW (ref 150–400)
RBC: 4.57 MIL/uL (ref 4.22–5.81)
RDW: 13 % (ref 11.5–15.5)
WBC Count: 6.4 10*3/uL (ref 4.0–10.5)
nRBC: 0 % (ref 0.0–0.2)

## 2019-04-04 LAB — CMP (CANCER CENTER ONLY)
ALT: 26 U/L (ref 0–44)
AST: 24 U/L (ref 15–41)
Albumin: 3.8 g/dL (ref 3.5–5.0)
Alkaline Phosphatase: 42 U/L (ref 38–126)
Anion gap: 11 (ref 5–15)
BUN: 17 mg/dL (ref 6–20)
CO2: 24 mmol/L (ref 22–32)
Calcium: 9.5 mg/dL (ref 8.9–10.3)
Chloride: 102 mmol/L (ref 98–111)
Creatinine: 0.94 mg/dL (ref 0.61–1.24)
GFR, Est AFR Am: >60 mL/min (ref >60)
GFR, Estimated: >60 mL/min (ref >60)
Glucose, Bld: 138 mg/dL — ABNORMAL HIGH (ref 70–99)
Potassium: 4.3 mmol/L (ref 3.5–5.1)
Sodium: 137 mmol/L (ref 135–145)
Total Bilirubin: 1 mg/dL (ref 0.3–1.2)
Total Protein: 6.7 g/dL (ref 6.5–8.1)

## 2019-04-04 MED ORDER — DEXAMETHASONE SODIUM PHOSPHATE 10 MG/ML IJ SOLN
INTRAMUSCULAR | Status: AC
  Filled 2019-04-04: qty 1

## 2019-04-04 MED ORDER — DEXAMETHASONE SODIUM PHOSPHATE 10 MG/ML IJ SOLN
10.0000 mg | Freq: Once | INTRAMUSCULAR | Status: AC
  Administered 2019-04-04: 10 mg via INTRAVENOUS

## 2019-04-04 MED ORDER — SODIUM CHLORIDE 0.9% FLUSH
10.0000 mL | INTRAVENOUS | Status: DC | PRN
  Administered 2019-04-04: 10 mL via INTRAVENOUS
  Filled 2019-04-04: qty 10

## 2019-04-04 MED ORDER — SODIUM CHLORIDE 0.9 % IV SOLN
2400.0000 mg/m2 | INTRAVENOUS | Status: DC
  Administered 2019-04-04: 5000 mg via INTRAVENOUS
  Filled 2019-04-04: qty 100

## 2019-04-04 MED ORDER — OXALIPLATIN CHEMO INJECTION 100 MG/20ML
85.0000 mg/m2 | Freq: Once | INTRAVENOUS | Status: AC
  Administered 2019-04-04: 180 mg via INTRAVENOUS
  Filled 2019-04-04: qty 36

## 2019-04-04 MED ORDER — FLUOROURACIL CHEMO INJECTION 2.5 GM/50ML
400.0000 mg/m2 | Freq: Once | INTRAVENOUS | Status: AC
  Administered 2019-04-04: 850 mg via INTRAVENOUS
  Filled 2019-04-04: qty 17

## 2019-04-04 MED ORDER — PALONOSETRON HCL INJECTION 0.25 MG/5ML
INTRAVENOUS | Status: AC
  Filled 2019-04-04: qty 5

## 2019-04-04 MED ORDER — PALONOSETRON HCL INJECTION 0.25 MG/5ML
0.2500 mg | Freq: Once | INTRAVENOUS | Status: AC
  Administered 2019-04-04: 0.25 mg via INTRAVENOUS

## 2019-04-04 MED ORDER — LEUCOVORIN CALCIUM INJECTION 350 MG
400.0000 mg/m2 | Freq: Once | INTRAVENOUS | Status: AC
  Administered 2019-04-04: 836 mg via INTRAVENOUS
  Filled 2019-04-04: qty 41.8

## 2019-04-04 MED ORDER — DEXTROSE 5 % IV SOLN
Freq: Once | INTRAVENOUS | Status: AC
  Filled 2019-04-04: qty 250

## 2019-04-04 NOTE — Progress Notes (Signed)
  [place] OFFICE PROGRESS NOTE   Diagnosis: Colon cancer  INTERVAL HISTORY:   [name] completed cycle 1 FOLFOX on 03/21/2019.  He reports mild nausea following chemotherapy, relieved with Compazine.  Cold sensitivity lasted a few days following chemotherapy.  No other neuropathy symptoms.  No mouth sores or diarrhea.  Good appetite and energy level.  Objective:  Vital signs in last 24 hours:  Blood pressure (!) 143/95, pulse 82, temperature 98 F (36.7 C), temperature source Temporal, resp. rate 17, height 6' (1.829 m), weight 198 lb (89.8 kg), SpO2 100 %.    Limited physical examination secondary to distancing with the Covid pandemic GI: No hepatomegaly, healed surgical incisions, nontender Vascular: No leg edema  Skin: Palms without erythema  Portacath/PICC-without erythema  Lab Results:  Lab Results  Component Value Date   WBC 6.4 04/04/2019   HGB 14.8 04/04/2019   HCT 42.3 04/04/2019   MCV 92.6 04/04/2019   PLT 124 (L) 04/04/2019   NEUTROABS 4.2 04/04/2019    CMP  Lab Results  Component Value Date   NA 140 03/17/2019   K 4.4 03/17/2019   CL 102 03/17/2019   CO2 25 03/17/2019   GLUCOSE 136 (H) 03/17/2019   BUN 17 03/17/2019   CREATININE 1.06 03/17/2019   CALCIUM 9.1 03/17/2019   PROT 7.3 03/17/2019   ALBUMIN 4.0 03/17/2019   AST 17 03/17/2019   ALT 21 03/17/2019   ALKPHOS 45 03/17/2019   BILITOT 0.9 03/17/2019   GFRNONAA >60 03/17/2019   GFRAA >60 03/17/2019    Medications: I have reviewed the patient's current medications.   Assessment/Plan: 1. 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 Mass felt to be in the transverse colon on a colonoscopy 01/07/2019 with a biopsy confirming poorly differentiated adenocarcinoma with signet ring cell features, mismatch repair protein expression normal ? CT  abdomen/pelvis 01/08/2019 12.3 cm indeterminate splenic mass, present in 2015 on a lumbar MRI-felt to be benign, small subpleural and pleural-based nodules at the lung bases, status post left nephrectomy ? CT chest 01/20/2019-small bilateral pulmonary nodules with rounded parenchymal nodules in the right lower lobe, indeterminate splenic mass ? CT chest 03/18/2019-multiple subcentimeter pulmonary nodules again seen bilaterally, greatest in the lower lobes, without significant change.  No new or enlarging pulmonary nodules or masses.  Partially visualized large heterogeneous splenic mass with no significant change.  Plan for follow-up chest CT at a 43-monthinterval. ? Cycle 1 FOLFOX 03/21/2019 ? Cycle 2 FOLFOX 04/04/2019  2. Multiple colon polyps-ascending colon polyps on the colonoscopy 01/07/2019-not removed 3. 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 Lumbar discitis 2015 7. Diabetes 8. Asthma 9. Multiple lipomas 10. Family history of colon cancer  [employer] panel 2020-POLE VUS 11.  Left nephrectomy at age [age] 12  Port-A-Cath placement, Dr. GJohney Maine 03/16/2019     Disposition: [name] the first cycle of chemotherapy well.  He will complete cycle 2 FOLFOX today.  He has mild thrombocytopenia secondary to chemotherapy.  He will call for bleeding.  He will return for an office visit and chemotherapy in 2 weeks.  GBetsy Coder MD  04/04/2019  11:37 AM

## 2019-04-04 NOTE — Patient Instructions (Signed)
[place] Discharge Instructions for Patients Receiving Chemotherapy  Today you received the following chemotherapy agents: Oxaliplatin (Eloxatin), Leucovorin, Fluorouracil (Adrucil, 5-FU)  To help prevent nausea and vomiting after your treatment, we encourage you to take your nausea medication as directed. Received Aloxi during your treatment today. For the next 3 days use your Compazine prescription as needed for break through nausea. Starting Thursday you can add your Zofran prescription into your regimen if Compazine isn't enough.   If you develop nausea and vomiting that is not controlled by your nausea medication, call the clinic.   BELOW ARE SYMPTOMS THAT SHOULD BE REPORTED IMMEDIATELY:  *FEVER GREATER THAN 100.5 F  *CHILLS WITH OR WITHOUT FEVER  NAUSEA AND VOMITING THAT IS NOT CONTROLLED WITH YOUR NAUSEA MEDICATION  *UNUSUAL SHORTNESS OF BREATH  *UNUSUAL BRUISING OR BLEEDING  TENDERNESS IN MOUTH AND THROAT WITH OR WITHOUT PRESENCE OF ULCERS  *URINARY PROBLEMS  *BOWEL PROBLEMS  UNUSUAL RASH Items with * indicate a potential emergency and should be followed up as soon as possible.  Feel free to call the clinic should you have any questions or concerns. The clinic phone number is [phone].  Please show the [place] at check-in to the Emergency Department and triage nurse.  Oxaliplatin Injection What is this medicine? OXALIPLATIN (ox AL i PLA tin) is a chemotherapy drug. It targets fast dividing cells, like cancer cells, and causes these cells to die. This medicine is used to treat cancers of the colon and rectum, and many other cancers. This medicine may be used for other purposes; ask your health care provider or pharmacist if you have questions. COMMON BRAND NAME(S): Eloxatin What should I tell my health care provider before I take this medicine? They need to know if you have any of these conditions:  kidney disease  an unusual or  allergic reaction to oxaliplatin, other chemotherapy, other medicines, foods, dyes, or preservatives  pregnant or trying to get pregnant  breast-feeding How should I use this medicine? This drug is given as an infusion into a vein. It is administered in a hospital or clinic by a specially trained health care professional. Talk to your pediatrician regarding the use of this medicine in children. Special care may be needed. Overdosage: If you think you have taken too much of this medicine contact a poison control center or emergency room at once. NOTE: This medicine is only for you. Do not share this medicine with others. What if I miss a dose? It is important not to miss a dose. Call your doctor or health care professional if you are unable to keep an appointment. What may interact with this medicine?  medicines to increase blood counts like filgrastim, pegfilgrastim, sargramostim  probenecid  some antibiotics like amikacin, gentamicin, neomycin, polymyxin B, streptomycin, tobramycin  zalcitabine Talk to your doctor or health care professional before taking any of these medicines:  acetaminophen  aspirin  ibuprofen  ketoprofen  naproxen This list may not describe all possible interactions. Give your health care provider a list of all the medicines, herbs, non-prescription drugs, or dietary supplements you use. Also tell them if you smoke, drink alcohol, or use illegal drugs. Some items may interact with your medicine. What should I watch for while using this medicine? Your condition will be monitored carefully while you are receiving this medicine. You will need important blood work done while you are taking this medicine. This medicine can make you more sensitive to cold. Do not drink  cold drinks or use ice. Cover exposed skin before coming in contact with cold temperatures or cold objects. When out in cold weather wear warm clothing and cover your mouth and nose to warm the air  that goes into your lungs. Tell your doctor if you get sensitive to the cold. This drug may make you feel generally unwell. This is not uncommon, as chemotherapy can affect healthy cells as well as cancer cells. Report any side effects. Continue your course of treatment even though you feel ill unless your doctor tells you to stop. In some cases, you may be given additional medicines to help with side effects. Follow all directions for their use. Call your doctor or health care professional for advice if you get a fever, chills or sore throat, or other symptoms of a cold or flu. Do not treat yourself. This drug decreases your body's ability to fight infections. Try to avoid being around people who are sick. This medicine may increase your risk to bruise or bleed. Call your doctor or health care professional if you notice any unusual bleeding. Be careful brushing and flossing your teeth or using a toothpick because you may get an infection or bleed more easily. If you have any dental work done, tell your dentist you are receiving this medicine. Avoid taking products that contain aspirin, acetaminophen, ibuprofen, naproxen, or ketoprofen unless instructed by your doctor. These medicines may hide a fever. Do not become pregnant while taking this medicine. Women should inform their doctor if they wish to become pregnant or think they might be pregnant. There is a potential for serious side effects to an unborn child. Talk to your health care professional or pharmacist for more information. Do not breast-feed an infant while taking this medicine. Call your doctor or health care professional if you get diarrhea. Do not treat yourself. What side effects may I notice from receiving this medicine? Side effects that you should report to your doctor or health care professional as soon as possible:  allergic reactions like skin rash, itching or hives, swelling of the face, lips, or tongue  low blood counts - This  drug may decrease the number of white blood cells, red blood cells and platelets. You may be at increased risk for infections and bleeding.  signs of infection - fever or chills, cough, sore throat, pain or difficulty passing urine  signs of decreased platelets or bleeding - bruising, pinpoint red spots on the skin, black, tarry stools, nosebleeds  signs of decreased red blood cells - unusually weak or tired, fainting spells, lightheadedness  breathing problems  chest pain, pressure  cough  diarrhea  jaw tightness  mouth sores  nausea and vomiting  pain, swelling, redness or irritation at the injection site  pain, tingling, numbness in the hands or feet  problems with balance, talking, walking  redness, blistering, peeling or loosening of the skin, including inside the mouth  trouble passing urine or change in the amount of urine Side effects that usually do not require medical attention (report to your doctor or health care professional if they continue or are bothersome):  changes in vision  constipation  hair loss  loss of appetite  metallic taste in the mouth or changes in taste  stomach pain This list may not describe all possible side effects. Call your doctor for medical advice about side effects. You may report side effects to FDA at 1-800-FDA-1088. Where should I keep my medicine? This drug is given in a hospital  or clinic and will not be stored at home. NOTE: This sheet is a summary. It may not cover all possible information. If you have questions about this medicine, talk to your doctor, pharmacist, or health care provider.  2020 Elsevier/Gold Standard (2007-10-12 17:22:47)  Leucovorin injection What is this medicine? LEUCOVORIN (loo koe VOR in) is used to prevent or treat the harmful effects of some medicines. This medicine is used to treat anemia caused by a low amount of folic acid in the body. It is also used with 5-fluorouracil (5-FU) to treat  colon cancer. This medicine may be used for other purposes; ask your health care provider or pharmacist if you have questions. What should I tell my health care provider before I take this medicine? They need to know if you have any of these conditions:  anemia from low levels of vitamin B-12 in the blood  an unusual or allergic reaction to leucovorin, folic acid, other medicines, foods, dyes, or preservatives  pregnant or trying to get pregnant  breast-feeding How should I use this medicine? This medicine is for injection into a muscle or into a vein. It is given by a health care professional in a hospital or clinic setting. Talk to your pediatrician regarding the use of this medicine in children. Special care may be needed. Overdosage: If you think you have taken too much of this medicine contact a poison control center or emergency room at once. NOTE: This medicine is only for you. Do not share this medicine with others. What if I miss a dose? This does not apply. What may interact with this medicine?  capecitabine  fluorouracil  phenobarbital  phenytoin  primidone  trimethoprim-sulfamethoxazole This list may not describe all possible interactions. Give your health care provider a list of all the medicines, herbs, non-prescription drugs, or dietary supplements you use. Also tell them if you smoke, drink alcohol, or use illegal drugs. Some items may interact with your medicine. What should I watch for while using this medicine? Your condition will be monitored carefully while you are receiving this medicine. This medicine may increase the side effects of 5-fluorouracil, 5-FU. Tell your doctor or health care professional if you have diarrhea or mouth sores that do not get better or that get worse. What side effects may I notice from receiving this medicine? Side effects that you should report to your doctor or health care professional as soon as possible:  allergic reactions  like skin rash, itching or hives, swelling of the face, lips, or tongue  breathing problems  fever, infection  mouth sores  unusual bleeding or bruising  unusually weak or tired Side effects that usually do not require medical attention (report to your doctor or health care professional if they continue or are bothersome):  constipation or diarrhea  loss of appetite  nausea, vomiting This list may not describe all possible side effects. Call your doctor for medical advice about side effects. You may report side effects to FDA at 1-800-FDA-1088. Where should I keep my medicine? This drug is given in a hospital or clinic and will not be stored at home. NOTE: This sheet is a summary. It may not cover all possible information. If you have questions about this medicine, talk to your doctor, pharmacist, or health care provider.  2020 Elsevier/Gold Standard (2007-09-21 16:50:29)  Fluorouracil, 5-FU injection What is this medicine? FLUOROURACIL, 5-FU (flure oh YOOR a sil) is a chemotherapy drug. It slows the growth of cancer cells. This medicine is  used to treat many types of cancer like breast cancer, colon or rectal cancer, pancreatic cancer, and stomach cancer. This medicine may be used for other purposes; ask your health care provider or pharmacist if you have questions. COMMON BRAND NAME(S): Adrucil What should I tell my health care provider before I take this medicine? They need to know if you have any of these conditions:  blood disorders  dihydropyrimidine dehydrogenase (DPD) deficiency  infection (especially a virus infection such as chickenpox, cold sores, or herpes)  kidney disease  liver disease  malnourished, poor nutrition  recent or ongoing radiation therapy  an unusual or allergic reaction to fluorouracil, other chemotherapy, other medicines, foods, dyes, or preservatives  pregnant or trying to get pregnant  breast-feeding How should I use this  medicine? This drug is given as an infusion or injection into a vein. It is administered in a hospital or clinic by a specially trained health care professional. Talk to your pediatrician regarding the use of this medicine in children. Special care may be needed. Overdosage: If you think you have taken too much of this medicine contact a poison control center or emergency room at once. NOTE: This medicine is only for you. Do not share this medicine with others. What if I miss a dose? It is important not to miss your dose. Call your doctor or health care professional if you are unable to keep an appointment. What may interact with this medicine?  allopurinol  cimetidine  dapsone  digoxin  hydroxyurea  leucovorin  levamisole  medicines for seizures like ethotoin, fosphenytoin, phenytoin  medicines to increase blood counts like filgrastim, pegfilgrastim, sargramostim  medicines that treat or prevent blood clots like warfarin, enoxaparin, and dalteparin  methotrexate  metronidazole  pyrimethamine  some other chemotherapy drugs like busulfan, cisplatin, estramustine, vinblastine  trimethoprim  trimetrexate  vaccines Talk to your doctor or health care professional before taking any of these medicines:  acetaminophen  aspirin  ibuprofen  ketoprofen  naproxen This list may not describe all possible interactions. Give your health care provider a list of all the medicines, herbs, non-prescription drugs, or dietary supplements you use. Also tell them if you smoke, drink alcohol, or use illegal drugs. Some items may interact with your medicine. What should I watch for while using this medicine? Visit your doctor for checks on your progress. This drug may make you feel generally unwell. This is not uncommon, as chemotherapy can affect healthy cells as well as cancer cells. Report any side effects. Continue your course of treatment even though you feel ill unless your doctor  tells you to stop. In some cases, you may be given additional medicines to help with side effects. Follow all directions for their use. Call your doctor or health care professional for advice if you get a fever, chills or sore throat, or other symptoms of a cold or flu. Do not treat yourself. This drug decreases your body's ability to fight infections. Try to avoid being around people who are sick. This medicine may increase your risk to bruise or bleed. Call your doctor or health care professional if you notice any unusual bleeding. Be careful brushing and flossing your teeth or using a toothpick because you may get an infection or bleed more easily. If you have any dental work done, tell your dentist you are receiving this medicine. Avoid taking products that contain aspirin, acetaminophen, ibuprofen, naproxen, or ketoprofen unless instructed by your doctor. These medicines may hide a fever. Do not  become pregnant while taking this medicine. Women should inform their doctor if they wish to become pregnant or think they might be pregnant. There is a potential for serious side effects to an unborn child. Talk to your health care professional or pharmacist for more information. Do not breast-feed an infant while taking this medicine. Men should inform their doctor if they wish to father a child. This medicine may lower sperm counts. Do not treat diarrhea with over the counter products. Contact your doctor if you have diarrhea that lasts more than 2 days or if it is severe and watery. This medicine can make you more sensitive to the sun. Keep out of the sun. If you cannot avoid being in the sun, wear protective clothing and use sunscreen. Do not use sun lamps or tanning beds/booths. What side effects may I notice from receiving this medicine? Side effects that you should report to your doctor or health care professional as soon as possible:  allergic reactions like skin rash, itching or hives, swelling of  the face, lips, or tongue  low blood counts - this medicine may decrease the number of white blood cells, red blood cells and platelets. You may be at increased risk for infections and bleeding.  signs of infection - fever or chills, cough, sore throat, pain or difficulty passing urine  signs of decreased platelets or bleeding - bruising, pinpoint red spots on the skin, black, tarry stools, blood in the urine  signs of decreased red blood cells - unusually weak or tired, fainting spells, lightheadedness  breathing problems  changes in vision  chest pain  mouth sores  nausea and vomiting  pain, swelling, redness at site where injected  pain, tingling, numbness in the hands or feet  redness, swelling, or sores on hands or feet  stomach pain  unusual bleeding Side effects that usually do not require medical attention (report to your doctor or health care professional if they continue or are bothersome):  changes in finger or toe nails  diarrhea  dry or itchy skin  hair loss  headache  loss of appetite  sensitivity of eyes to the light  stomach upset  unusually teary eyes This list may not describe all possible side effects. Call your doctor for medical advice about side effects. You may report side effects to FDA at 1-800-FDA-1088. Where should I keep my medicine? This drug is given in a hospital or clinic and will not be stored at home. NOTE: This sheet is a summary. It may not cover all possible information. If you have questions about this medicine, talk to your doctor, pharmacist, or health care provider.  2020 Elsevier/Gold Standard (2007-07-21 13:53:16)  Coronavirus (COVID-19) Are you at risk?  Are you at risk for the Coronavirus (COVID-19)?  To be considered HIGH RISK for Coronavirus (COVID-19), you have to meet the following criteria:  . Traveled to [place], [place], [place], [place] or [place]; or in the [place] to [place], [place], [place], or [place]; and have fever, cough, and shortness of breath within the last 2 weeks of travel OR . Been in close contact with a person diagnosed with COVID-19 within the last 2 weeks and have fever, cough, and shortness of breath . IF YOU DO NOT MEET THESE CRITERIA, YOU ARE CONSIDERED LOW RISK FOR COVID-19.  What to do if you are HIGH RISK for COVID-19?  Marland Kitchen If you are having a medical emergency, call 911. . Seek medical care right away.  Before you go to a doctor's office, urgent care or emergency department, call ahead and tell them about your recent travel, contact with someone diagnosed with COVID-19, and your symptoms. You should receive instructions from your physician's office regarding next steps of care.  . When you arrive at healthcare provider, tell the healthcare staff immediately you have returned from visiting [place], [place], [place], [place] or [place]; or traveled in the [place] to [place], [place], [place], or [place]; in the last two weeks or you have been in close contact with a person diagnosed with COVID-19 in the last 2 weeks.   . Tell the health care staff about your symptoms: fever, cough and shortness of breath. . After you have been seen by a medical provider, you will be either: o Tested for (COVID-19) and discharged home on quarantine except to seek medical care if symptoms worsen, and asked to  - Stay home and avoid contact with others until you get your results (4-5 days)  - Avoid travel on public transportation if possible (such as bus, train, or airplane) or o Sent to the Emergency Department by EMS for evaluation, COVID-19 testing, and possible admission depending on your condition and test results.  What to do if you are LOW RISK for COVID-19?  Reduce your risk of any infection by using the same precautions used for avoiding the common cold or flu:  Marland Kitchen Wash your hands often with soap and warm water for at least 20 seconds.  If soap and water are  not readily available, use an alcohol-based hand sanitizer with at least 60% alcohol.  . If coughing or sneezing, cover your mouth and nose by coughing or sneezing into the elbow areas of your shirt or coat, into a tissue or into your sleeve (not your hands). . Avoid shaking hands with others and consider head nods or verbal greetings only. . Avoid touching your eyes, nose, or mouth with unwashed hands.  . Avoid close contact with people who are sick. . Avoid places or events with large numbers of people in one location, like concerts or sporting events. . Carefully consider travel plans you have or are making. . If you are planning any travel outside or inside the [place], visit the CDC's Travelers' Health webpage for the latest health notices. . If you have some symptoms but not all symptoms, continue to monitor at home and seek medical attention if your symptoms worsen. . If you are having a medical emergency, call 911.   [place] / e-Visit: [email or web address]         [facility]: [facility]: [id number]                   [facility]: [phone]

## 2019-04-04 NOTE — Progress Notes (Signed)
Okay to give premeds while waiting on CMP results from [facility] lab. Will review results before releasing tx., per Dr. Benay Spice.

## 2019-04-04 NOTE — Patient Instructions (Signed)

## 2019-04-06 ENCOUNTER — Inpatient Hospital Stay: Payer: BC Managed Care – PPO

## 2019-04-06 ENCOUNTER — Other Ambulatory Visit: Payer: Self-pay

## 2019-04-06 ENCOUNTER — Telehealth: Payer: Self-pay | Admitting: Oncology

## 2019-04-06 VITALS — BP 138/72 | HR 82 | Temp 98.3°F | Resp 17

## 2019-04-06 DIAGNOSIS — C186 Malignant neoplasm of descending colon: Secondary | ICD-10-CM

## 2019-04-06 DIAGNOSIS — Z5111 Encounter for antineoplastic chemotherapy: Secondary | ICD-10-CM | POA: Diagnosis not present

## 2019-04-06 DIAGNOSIS — C184 Malignant neoplasm of transverse colon: Secondary | ICD-10-CM | POA: Diagnosis not present

## 2019-04-06 MED ORDER — HEPARIN SOD (PORK) LOCK FLUSH 100 UNIT/ML IV SOLN
500.0000 [IU] | Freq: Once | INTRAVENOUS | Status: AC | PRN
  Administered 2019-04-06: 500 [IU]
  Filled 2019-04-06: qty 5

## 2019-04-06 MED ORDER — SODIUM CHLORIDE 0.9% FLUSH
10.0000 mL | INTRAVENOUS | Status: DC | PRN
  Administered 2019-04-06: 10 mL
  Filled 2019-04-06: qty 10

## 2019-04-06 NOTE — Telephone Encounter (Signed)
Scheduled per los. Called and spoke with patient. Confirmed appt 

## 2019-04-17 ENCOUNTER — Other Ambulatory Visit: Payer: Self-pay | Admitting: Oncology

## 2019-04-18 ENCOUNTER — Inpatient Hospital Stay: Payer: BC Managed Care – PPO

## 2019-04-18 ENCOUNTER — Other Ambulatory Visit: Payer: Self-pay

## 2019-04-18 ENCOUNTER — Inpatient Hospital Stay: Payer: BC Managed Care – PPO | Admitting: Oncology

## 2019-04-18 VITALS — BP 161/98 | HR 85 | Temp 98.0°F | Resp 17 | Ht 72.0 in | Wt 195.0 lb

## 2019-04-18 DIAGNOSIS — C184 Malignant neoplasm of transverse colon: Secondary | ICD-10-CM | POA: Diagnosis not present

## 2019-04-18 DIAGNOSIS — Z95828 Presence of other vascular implants and grafts: Secondary | ICD-10-CM

## 2019-04-18 DIAGNOSIS — C186 Malignant neoplasm of descending colon: Secondary | ICD-10-CM | POA: Diagnosis not present

## 2019-04-18 DIAGNOSIS — Z5111 Encounter for antineoplastic chemotherapy: Secondary | ICD-10-CM | POA: Diagnosis not present

## 2019-04-18 DIAGNOSIS — C182 Malignant neoplasm of ascending colon: Secondary | ICD-10-CM

## 2019-04-18 LAB — CBC WITH DIFFERENTIAL (CANCER CENTER ONLY)
Abs Immature Granulocytes: 0.03 10*3/uL (ref 0.00–0.07)
Basophils Absolute: 0 10*3/uL (ref 0.0–0.1)
Basophils Relative: 1 %
Eosinophils Absolute: 0.2 10*3/uL (ref 0.0–0.5)
Eosinophils Relative: 4 %
HCT: 39.9 % (ref 39.0–52.0)
Hemoglobin: 13.9 g/dL (ref 13.0–17.0)
Immature Granulocytes: 1 %
Lymphocytes Relative: 18 %
Lymphs Abs: 0.9 10*3/uL (ref 0.7–4.0)
MCH: 32.1 pg (ref 26.0–34.0)
MCHC: 34.8 g/dL (ref 30.0–36.0)
MCV: 92.1 fL (ref 80.0–100.0)
Monocytes Absolute: 0.6 10*3/uL (ref 0.1–1.0)
Monocytes Relative: 11 %
Neutro Abs: 3.2 10*3/uL (ref 1.7–7.7)
Neutrophils Relative %: 65 %
Platelet Count: 75 10*3/uL — ABNORMAL LOW (ref 150–400)
RBC: 4.33 MIL/uL (ref 4.22–5.81)
RDW: 14 % (ref 11.5–15.5)
WBC Count: 5 10*3/uL (ref 4.0–10.5)
nRBC: 0 % (ref 0.0–0.2)

## 2019-04-18 LAB — CMP (CANCER CENTER ONLY)
ALT: 31 U/L (ref 0–44)
AST: 23 U/L (ref 15–41)
Albumin: 3.5 g/dL (ref 3.5–5.0)
Alkaline Phosphatase: 53 U/L (ref 38–126)
Anion gap: 10 (ref 5–15)
BUN: 18 mg/dL (ref 6–20)
CO2: 21 mmol/L — ABNORMAL LOW (ref 22–32)
Calcium: 8.3 mg/dL — ABNORMAL LOW (ref 8.9–10.3)
Chloride: 106 mmol/L (ref 98–111)
Creatinine: 1.05 mg/dL (ref 0.61–1.24)
GFR, Est AFR Am: >60 mL/min (ref >60)
GFR, Estimated: >60 mL/min (ref >60)
Glucose, Bld: 240 mg/dL — ABNORMAL HIGH (ref 70–99)
Potassium: 4 mmol/L (ref 3.5–5.1)
Sodium: 137 mmol/L (ref 135–145)
Total Bilirubin: 0.7 mg/dL (ref 0.3–1.2)
Total Protein: 6.4 g/dL — ABNORMAL LOW (ref 6.5–8.1)

## 2019-04-18 MED ORDER — LEUCOVORIN CALCIUM INJECTION 350 MG
400.0000 mg/m2 | Freq: Once | INTRAVENOUS | Status: AC
  Administered 2019-04-18: 836 mg via INTRAVENOUS
  Filled 2019-04-18: qty 41.8

## 2019-04-18 MED ORDER — PALONOSETRON HCL INJECTION 0.25 MG/5ML
0.2500 mg | Freq: Once | INTRAVENOUS | Status: AC
  Administered 2019-04-18: 0.25 mg via INTRAVENOUS

## 2019-04-18 MED ORDER — FLUOROURACIL CHEMO INJECTION 2.5 GM/50ML
400.0000 mg/m2 | Freq: Once | INTRAVENOUS | Status: AC
  Administered 2019-04-18: 850 mg via INTRAVENOUS
  Filled 2019-04-18: qty 17

## 2019-04-18 MED ORDER — DEXAMETHASONE SODIUM PHOSPHATE 10 MG/ML IJ SOLN
INTRAMUSCULAR | Status: AC
  Filled 2019-04-18: qty 1

## 2019-04-18 MED ORDER — DEXTROSE 5 % IV SOLN
Freq: Once | INTRAVENOUS | Status: AC
  Filled 2019-04-18: qty 250

## 2019-04-18 MED ORDER — PALONOSETRON HCL INJECTION 0.25 MG/5ML
INTRAVENOUS | Status: AC
  Filled 2019-04-18: qty 5

## 2019-04-18 MED ORDER — SODIUM CHLORIDE 0.9 % IV SOLN
2400.0000 mg/m2 | INTRAVENOUS | Status: DC
  Administered 2019-04-18: 5000 mg via INTRAVENOUS
  Filled 2019-04-18: qty 100

## 2019-04-18 MED ORDER — SODIUM CHLORIDE 0.9% FLUSH
10.0000 mL | INTRAVENOUS | Status: DC | PRN
  Administered 2019-04-18: 10 mL via INTRAVENOUS
  Filled 2019-04-18: qty 10

## 2019-04-18 MED ORDER — OXALIPLATIN CHEMO INJECTION 100 MG/20ML
65.0000 mg/m2 | Freq: Once | INTRAVENOUS | Status: AC
  Administered 2019-04-18: 135 mg via INTRAVENOUS
  Filled 2019-04-18: qty 20

## 2019-04-18 MED ORDER — DEXAMETHASONE SODIUM PHOSPHATE 10 MG/ML IJ SOLN
10.0000 mg | Freq: Once | INTRAMUSCULAR | Status: AC
  Administered 2019-04-18: 10 mg via INTRAVENOUS

## 2019-04-18 NOTE — Progress Notes (Signed)
Per Dr. Benay Spice: OK to treat w/platelet count 75,000. Will dose reduce oxaliplatin today.

## 2019-04-18 NOTE — Progress Notes (Signed)
[facility] OFFICE PROGRESS NOTE   Diagnosis: Colon cancer  INTERVAL HISTORY:   [name] completed another cycle of FOLFOX beginning 04/04/2019.  He had mild nausea on the evening of day 1 and day 3.  Compazine helped.  No emesis.  No diarrhea.  He had one ulcer at the lower inner lip that has resolved.  Cold sensitivity lasted a few days.  No neuropathy symptoms at present.  Reports transient pain near the low transverse incision, relieved with ambulating.  Objective:  Vital signs in last 24 hours:  Blood pressure (!) 161/98, pulse 85, temperature 98 F (36.7 C), temperature source Temporal, resp. rate 17, height 6' (1.829 m), weight 195 lb (88.5 kg), SpO2 98 %.    HEENT: 2 small (2-3 mm) healing ulcerations at the lower inner lip, no thrush GI: No hepatosplenomegaly, nontender, no mass Vascular: No leg edema  Skin: Palms without erythema  Portacath/PICC-without erythema  Lab Results:  Lab Results  Component Value Date   WBC 5.0 04/18/2019   HGB 13.9 04/18/2019   HCT 39.9 04/18/2019   MCV 92.1 04/18/2019   PLT 75 (L) 04/18/2019   NEUTROABS 3.2 04/18/2019    CMP  Lab Results  Component Value Date   NA 137 04/18/2019   K 4.0 04/18/2019   CL 106 04/18/2019   CO2 21 (L) 04/18/2019   GLUCOSE 240 (H) 04/18/2019   BUN 18 04/18/2019   CREATININE 1.05 04/18/2019   CALCIUM 8.3 (L) 04/18/2019   PROT 6.4 (L) 04/18/2019   ALBUMIN 3.5 04/18/2019   AST 23 04/18/2019   ALT 31 04/18/2019   ALKPHOS 53 04/18/2019   BILITOT 0.7 04/18/2019   GFRNONAA >60 04/18/2019   GFRAA >60 04/18/2019     Medications: I have reviewed the patient's current medications.   Assessment/Plan: 1. 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 Mass felt to be in the transverse colon on a colonoscopy 01/07/2019 with a biopsy confirming poorly  differentiated adenocarcinoma with signet ring cell features, mismatch repair protein expression normal ? CT abdomen/pelvis 01/08/2019 12.3 cm indeterminate splenic mass, present in 2015 on a lumbar MRI-felt to be benign, small subpleural and pleural-based nodules at the lung bases, status post left nephrectomy ? CT chest 01/20/2019-small bilateral pulmonary nodules with rounded parenchymal nodules in the right lower lobe, indeterminate splenic mass ? CT chest 03/18/2019-multiple subcentimeter pulmonary nodules again seen bilaterally, greatest in the lower lobes, without significant change.  No new or enlarging pulmonary nodules or masses.  Partially visualized large heterogeneous splenic mass with no significant change.  Plan for follow-up chest CT at a 7-monthinterval. ? Cycle 1 FOLFOX 03/21/2019 ? Cycle 2 FOLFOX 04/04/2019 ? Cycle 3 FOLFOX 04/18/2019 (oxaliplatin dose reduced secondary to thrombocytopenia)  2. Multiple colon polyps-ascending colon polyps on the colonoscopy 01/07/2019-not removed 3. Wilms tumor at age 89 status post chemotherapy/radiation and a nephrectomy in [place]. 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 Lumbar discitis 2015 7. Diabetes 8. Asthma 9. Multiple lipomas 10. Family history of colon cancer  [employer] panel 2020-POLE VUS 11.  Left nephrectomy at age 54 12  Port-A-Cath placement, Dr. GJohney Maine 03/16/2019 13.  Thrombocytopenia secondary to chemotherapy-oxaliplatin dose reduced beginning with cycle 3 FOLFOX   Disposition: Mr. JShrewsberryhas completed 2 cycles of FOLFOX.  He is tolerating chemotherapy well.  He has moderate thrombocytopenia  today.  We discussed the risk of proceeding with chemotherapy including the chance of bleeding.  The oxaliplatin will be dose reduced and he will call for bleeding or bruising.  [name] will return for an office visit in the next cycle of chemotherapy in 2  weeks.  David Coder, MD  04/18/2019  9:07 AM

## 2019-04-18 NOTE — Patient Instructions (Signed)
[facility] Discharge Instructions for Patients Receiving Chemotherapy  Today you received the following chemotherapy agents: Oxaliplatin (Eloxatin), Leucovorin, Fluorouracil (Adrucil, 5-FU)  To help prevent nausea and vomiting after your treatment, we encourage you to take your nausea medication as directed. Received Aloxi during your treatment today. For the next 3 days use your Compazine prescription as needed for break through nausea. Starting Thursday you can add your Zofran prescription into your regimen if Compazine isn't enough.   If you develop nausea and vomiting that is not controlled by your nausea medication, call the clinic.   BELOW ARE SYMPTOMS THAT SHOULD BE REPORTED IMMEDIATELY:  *FEVER GREATER THAN 100.5 F  *CHILLS WITH OR WITHOUT FEVER  NAUSEA AND VOMITING THAT IS NOT CONTROLLED WITH YOUR NAUSEA MEDICATION  *UNUSUAL SHORTNESS OF BREATH  *UNUSUAL BRUISING OR BLEEDING  TENDERNESS IN MOUTH AND THROAT WITH OR WITHOUT PRESENCE OF ULCERS  *URINARY PROBLEMS  *BOWEL PROBLEMS  UNUSUAL RASH Items with * indicate a potential emergency and should be followed up as soon as possible.  Feel free to call the clinic should you have any questions or concerns. The clinic phone number is [phone].  Please show the [facility] at check-in to the Emergency Department and triage nurse.  Oxaliplatin Injection What is this medicine? OXALIPLATIN (ox AL i PLA tin) is a chemotherapy drug. It targets fast dividing cells, like cancer cells, and causes these cells to die. This medicine is used to treat cancers of the colon and rectum, and many other cancers. This medicine may be used for other purposes; ask your health care provider or pharmacist if you have questions. COMMON BRAND NAME(S): Eloxatin What should I tell my health care provider before I take this medicine? They need to know if you have any of these conditions:  kidney disease  an unusual or  allergic reaction to oxaliplatin, other chemotherapy, other medicines, foods, dyes, or preservatives  pregnant or trying to get pregnant  breast-feeding How should I use this medicine? This drug is given as an infusion into a vein. It is administered in a hospital or clinic by a specially trained health care professional. Talk to your pediatrician regarding the use of this medicine in children. Special care may be needed. Overdosage: If you think you have taken too much of this medicine contact a poison control center or emergency room at once. NOTE: This medicine is only for you. Do not share this medicine with others. What if I miss a dose? It is important not to miss a dose. Call your doctor or health care professional if you are unable to keep an appointment. What may interact with this medicine?  medicines to increase blood counts like filgrastim, pegfilgrastim, sargramostim  probenecid  some antibiotics like amikacin, gentamicin, neomycin, polymyxin B, streptomycin, tobramycin  zalcitabine Talk to your doctor or health care professional before taking any of these medicines:  acetaminophen  aspirin  ibuprofen  ketoprofen  naproxen This list may not describe all possible interactions. Give your health care provider a list of all the medicines, herbs, non-prescription drugs, or dietary supplements you use. Also tell them if you smoke, drink alcohol, or use illegal drugs. Some items may interact with your medicine. What should I watch for while using this medicine? Your condition will be monitored carefully while you are receiving this medicine. You will need important blood work done while you are taking this medicine. This medicine can make you more sensitive to cold. Do not drink  cold drinks or use ice. Cover exposed skin before coming in contact with cold temperatures or cold objects. When out in cold weather wear warm clothing and cover your mouth and nose to warm the air  that goes into your lungs. Tell your doctor if you get sensitive to the cold. This drug may make you feel generally unwell. This is not uncommon, as chemotherapy can affect healthy cells as well as cancer cells. Report any side effects. Continue your course of treatment even though you feel ill unless your doctor tells you to stop. In some cases, you may be given additional medicines to help with side effects. Follow all directions for their use. Call your doctor or health care professional for advice if you get a fever, chills or sore throat, or other symptoms of a cold or flu. Do not treat yourself. This drug decreases your body's ability to fight infections. Try to avoid being around people who are sick. This medicine may increase your risk to bruise or bleed. Call your doctor or health care professional if you notice any unusual bleeding. Be careful brushing and flossing your teeth or using a toothpick because you may get an infection or bleed more easily. If you have any dental work done, tell your dentist you are receiving this medicine. Avoid taking products that contain aspirin, acetaminophen, ibuprofen, naproxen, or ketoprofen unless instructed by your doctor. These medicines may hide a fever. Do not become pregnant while taking this medicine. Women should inform their doctor if they wish to become pregnant or think they might be pregnant. There is a potential for serious side effects to an unborn child. Talk to your health care professional or pharmacist for more information. Do not breast-feed an infant while taking this medicine. Call your doctor or health care professional if you get diarrhea. Do not treat yourself. What side effects may I notice from receiving this medicine? Side effects that you should report to your doctor or health care professional as soon as possible:  allergic reactions like skin rash, itching or hives, swelling of the face, lips, or tongue  low blood counts - This  drug may decrease the number of white blood cells, red blood cells and platelets. You may be at increased risk for infections and bleeding.  signs of infection - fever or chills, cough, sore throat, pain or difficulty passing urine  signs of decreased platelets or bleeding - bruising, pinpoint red spots on the skin, black, tarry stools, nosebleeds  signs of decreased red blood cells - unusually weak or tired, fainting spells, lightheadedness  breathing problems  chest pain, pressure  cough  diarrhea  jaw tightness  mouth sores  nausea and vomiting  pain, swelling, redness or irritation at the injection site  pain, tingling, numbness in the hands or feet  problems with balance, talking, walking  redness, blistering, peeling or loosening of the skin, including inside the mouth  trouble passing urine or change in the amount of urine Side effects that usually do not require medical attention (report to your doctor or health care professional if they continue or are bothersome):  changes in vision  constipation  hair loss  loss of appetite  metallic taste in the mouth or changes in taste  stomach pain This list may not describe all possible side effects. Call your doctor for medical advice about side effects. You may report side effects to FDA at 1-800-FDA-1088. Where should I keep my medicine? This drug is given in a hospital  or clinic and will not be stored at home. NOTE: This sheet is a summary. It may not cover all possible information. If you have questions about this medicine, talk to your doctor, pharmacist, or health care provider.  2020 Elsevier/Gold Standard (2007-10-12 17:22:47)  Leucovorin injection What is this medicine? LEUCOVORIN (loo koe VOR in) is used to prevent or treat the harmful effects of some medicines. This medicine is used to treat anemia caused by a low amount of folic acid in the body. It is also used with 5-fluorouracil (5-FU) to treat  colon cancer. This medicine may be used for other purposes; ask your health care provider or pharmacist if you have questions. What should I tell my health care provider before I take this medicine? They need to know if you have any of these conditions:  anemia from low levels of vitamin B-12 in the blood  an unusual or allergic reaction to leucovorin, folic acid, other medicines, foods, dyes, or preservatives  pregnant or trying to get pregnant  breast-feeding How should I use this medicine? This medicine is for injection into a muscle or into a vein. It is given by a health care professional in a hospital or clinic setting. Talk to your pediatrician regarding the use of this medicine in children. Special care may be needed. Overdosage: If you think you have taken too much of this medicine contact a poison control center or emergency room at once. NOTE: This medicine is only for you. Do not share this medicine with others. What if I miss a dose? This does not apply. What may interact with this medicine?  capecitabine  fluorouracil  phenobarbital  phenytoin  primidone  trimethoprim-sulfamethoxazole This list may not describe all possible interactions. Give your health care provider a list of all the medicines, herbs, non-prescription drugs, or dietary supplements you use. Also tell them if you smoke, drink alcohol, or use illegal drugs. Some items may interact with your medicine. What should I watch for while using this medicine? Your condition will be monitored carefully while you are receiving this medicine. This medicine may increase the side effects of 5-fluorouracil, 5-FU. Tell your doctor or health care professional if you have diarrhea or mouth sores that do not get better or that get worse. What side effects may I notice from receiving this medicine? Side effects that you should report to your doctor or health care professional as soon as possible:  allergic reactions  like skin rash, itching or hives, swelling of the face, lips, or tongue  breathing problems  fever, infection  mouth sores  unusual bleeding or bruising  unusually weak or tired Side effects that usually do not require medical attention (report to your doctor or health care professional if they continue or are bothersome):  constipation or diarrhea  loss of appetite  nausea, vomiting This list may not describe all possible side effects. Call your doctor for medical advice about side effects. You may report side effects to FDA at 1-800-FDA-1088. Where should I keep my medicine? This drug is given in a hospital or clinic and will not be stored at home. NOTE: This sheet is a summary. It may not cover all possible information. If you have questions about this medicine, talk to your doctor, pharmacist, or health care provider.  2020 Elsevier/Gold Standard (2007-09-21 16:50:29)  Fluorouracil, 5-FU injection What is this medicine? FLUOROURACIL, 5-FU (flure oh YOOR a sil) is a chemotherapy drug. It slows the growth of cancer cells. This medicine is  used to treat many types of cancer like breast cancer, colon or rectal cancer, pancreatic cancer, and stomach cancer. This medicine may be used for other purposes; ask your health care provider or pharmacist if you have questions. COMMON BRAND NAME(S): Adrucil What should I tell my health care provider before I take this medicine? They need to know if you have any of these conditions:  blood disorders  dihydropyrimidine dehydrogenase (DPD) deficiency  infection (especially a virus infection such as chickenpox, cold sores, or herpes)  kidney disease  liver disease  malnourished, poor nutrition  recent or ongoing radiation therapy  an unusual or allergic reaction to fluorouracil, other chemotherapy, other medicines, foods, dyes, or preservatives  pregnant or trying to get pregnant  breast-feeding How should I use this  medicine? This drug is given as an infusion or injection into a vein. It is administered in a hospital or clinic by a specially trained health care professional. Talk to your pediatrician regarding the use of this medicine in children. Special care may be needed. Overdosage: If you think you have taken too much of this medicine contact a poison control center or emergency room at once. NOTE: This medicine is only for you. Do not share this medicine with others. What if I miss a dose? It is important not to miss your dose. Call your doctor or health care professional if you are unable to keep an appointment. What may interact with this medicine?  allopurinol  cimetidine  dapsone  digoxin  hydroxyurea  leucovorin  levamisole  medicines for seizures like ethotoin, fosphenytoin, phenytoin  medicines to increase blood counts like filgrastim, pegfilgrastim, sargramostim  medicines that treat or prevent blood clots like warfarin, enoxaparin, and dalteparin  methotrexate  metronidazole  pyrimethamine  some other chemotherapy drugs like busulfan, cisplatin, estramustine, vinblastine  trimethoprim  trimetrexate  vaccines Talk to your doctor or health care professional before taking any of these medicines:  acetaminophen  aspirin  ibuprofen  ketoprofen  naproxen This list may not describe all possible interactions. Give your health care provider a list of all the medicines, herbs, non-prescription drugs, or dietary supplements you use. Also tell them if you smoke, drink alcohol, or use illegal drugs. Some items may interact with your medicine. What should I watch for while using this medicine? Visit your doctor for checks on your progress. This drug may make you feel generally unwell. This is not uncommon, as chemotherapy can affect healthy cells as well as cancer cells. Report any side effects. Continue your course of treatment even though you feel ill unless your doctor  tells you to stop. In some cases, you may be given additional medicines to help with side effects. Follow all directions for their use. Call your doctor or health care professional for advice if you get a fever, chills or sore throat, or other symptoms of a cold or flu. Do not treat yourself. This drug decreases your body's ability to fight infections. Try to avoid being around people who are sick. This medicine may increase your risk to bruise or bleed. Call your doctor or health care professional if you notice any unusual bleeding. Be careful brushing and flossing your teeth or using a toothpick because you may get an infection or bleed more easily. If you have any dental work done, tell your dentist you are receiving this medicine. Avoid taking products that contain aspirin, acetaminophen, ibuprofen, naproxen, or ketoprofen unless instructed by your doctor. These medicines may hide a fever. Do not  become pregnant while taking this medicine. Women should inform their doctor if they wish to become pregnant or think they might be pregnant. There is a potential for serious side effects to an unborn child. Talk to your health care professional or pharmacist for more information. Do not breast-feed an infant while taking this medicine. Men should inform their doctor if they wish to father a child. This medicine may lower sperm counts. Do not treat diarrhea with over the counter products. Contact your doctor if you have diarrhea that lasts more than 2 days or if it is severe and watery. This medicine can make you more sensitive to the sun. Keep out of the sun. If you cannot avoid being in the sun, wear protective clothing and use sunscreen. Do not use sun lamps or tanning beds/booths. What side effects may I notice from receiving this medicine? Side effects that you should report to your doctor or health care professional as soon as possible:  allergic reactions like skin rash, itching or hives, swelling of  the face, lips, or tongue  low blood counts - this medicine may decrease the number of white blood cells, red blood cells and platelets. You may be at increased risk for infections and bleeding.  signs of infection - fever or chills, cough, sore throat, pain or difficulty passing urine  signs of decreased platelets or bleeding - bruising, pinpoint red spots on the skin, black, tarry stools, blood in the urine  signs of decreased red blood cells - unusually weak or tired, fainting spells, lightheadedness  breathing problems  changes in vision  chest pain  mouth sores  nausea and vomiting  pain, swelling, redness at site where injected  pain, tingling, numbness in the hands or feet  redness, swelling, or sores on hands or feet  stomach pain  unusual bleeding Side effects that usually do not require medical attention (report to your doctor or health care professional if they continue or are bothersome):  changes in finger or toe nails  diarrhea  dry or itchy skin  hair loss  headache  loss of appetite  sensitivity of eyes to the light  stomach upset  unusually teary eyes This list may not describe all possible side effects. Call your doctor for medical advice about side effects. You may report side effects to FDA at 1-800-FDA-1088. Where should I keep my medicine? This drug is given in a hospital or clinic and will not be stored at home. NOTE: This sheet is a summary. It may not cover all possible information. If you have questions about this medicine, talk to your doctor, pharmacist, or health care provider.  2020 Elsevier/Gold Standard (2007-07-21 13:53:16)  Coronavirus (COVID-19) Are you at risk?  Are you at risk for the Coronavirus (COVID-19)?  To be considered HIGH RISK for Coronavirus (COVID-19), you have to meet the following criteria:  . Traveled to [place], [place], [place], [place] or [place]; or in the [place] to [place], [place], [place], or [place]; and have fever, cough, and shortness of breath within the last 2 weeks of travel OR . Been in close contact with a person diagnosed with COVID-19 within the last 2 weeks and have fever, cough, and shortness of breath . IF YOU DO NOT MEET THESE CRITERIA, YOU ARE CONSIDERED LOW RISK FOR COVID-19.  What to do if you are HIGH RISK for COVID-19?  Marland Kitchen If you are having a medical emergency, call 911. . Seek medical care right away.  Before you go to a doctor's office, urgent care or emergency department, call ahead and tell them about your recent travel, contact with someone diagnosed with COVID-19, and your symptoms. You should receive instructions from your physician's office regarding next steps of care.  . When you arrive at healthcare provider, tell the healthcare staff immediately you have returned from visiting [place], [place], [place], [place] or [place]; or traveled in the [place] to [place], [place], [place], or [place]; in the last two weeks or you have been in close contact with a person diagnosed with COVID-19 in the last 2 weeks.   . Tell the health care staff about your symptoms: fever, cough and shortness of breath. . After you have been seen by a medical provider, you will be either: o Tested for (COVID-19) and discharged home on quarantine except to seek medical care if symptoms worsen, and asked to  - Stay home and avoid contact with others until you get your results (4-5 days)  - Avoid travel on public transportation if possible (such as bus, train, or airplane) or o Sent to the Emergency Department by EMS for evaluation, COVID-19 testing, and possible admission depending on your condition and test results.  What to do if you are LOW RISK for COVID-19?  Reduce your risk of any infection by using the same precautions used for avoiding the common cold or flu:  Marland Kitchen Wash your hands often with soap and warm water for at least 20 seconds.  If soap and water are  not readily available, use an alcohol-based hand sanitizer with at least 60% alcohol.  . If coughing or sneezing, cover your mouth and nose by coughing or sneezing into the elbow areas of your shirt or coat, into a tissue or into your sleeve (not your hands). . Avoid shaking hands with others and consider head nods or verbal greetings only. . Avoid touching your eyes, nose, or mouth with unwashed hands.  . Avoid close contact with people who are sick. . Avoid places or events with large numbers of people in one location, like concerts or sporting events. . Carefully consider travel plans you have or are making. . If you are planning any travel outside or inside the [place], visit the CDC's Travelers' Health webpage for the latest health notices. . If you have some symptoms but not all symptoms, continue to monitor at home and seek medical attention if your symptoms worsen. . If you are having a medical emergency, call 911.   [place] / e-Visit: [email or web address]         [facility]: [facility]: [id number]                   [facility]: [phone]

## 2019-04-18 NOTE — Patient Instructions (Signed)

## 2019-04-19 ENCOUNTER — Encounter: Payer: Self-pay | Admitting: *Deleted

## 2019-04-19 NOTE — Progress Notes (Signed)
Call from [place], [place] with [employer] requesting last office note be faxed to [phone]. Return call [phone]. Faxed office note from 04/18/19 w/confirmation fax received.

## 2019-04-20 ENCOUNTER — Inpatient Hospital Stay: Payer: BC Managed Care – PPO

## 2019-04-20 ENCOUNTER — Other Ambulatory Visit: Payer: Self-pay

## 2019-04-20 DIAGNOSIS — C186 Malignant neoplasm of descending colon: Secondary | ICD-10-CM

## 2019-04-20 DIAGNOSIS — C184 Malignant neoplasm of transverse colon: Secondary | ICD-10-CM | POA: Diagnosis not present

## 2019-04-20 DIAGNOSIS — Z5111 Encounter for antineoplastic chemotherapy: Secondary | ICD-10-CM | POA: Diagnosis not present

## 2019-04-20 MED ORDER — HEPARIN SOD (PORK) LOCK FLUSH 100 UNIT/ML IV SOLN
500.0000 [IU] | Freq: Once | INTRAVENOUS | Status: AC | PRN
  Administered 2019-04-20: 500 [IU]
  Filled 2019-04-20: qty 5

## 2019-04-20 MED ORDER — SODIUM CHLORIDE 0.9% FLUSH
10.0000 mL | INTRAVENOUS | Status: DC | PRN
  Administered 2019-04-20: 10 mL
  Filled 2019-04-20: qty 10

## 2019-04-21 DIAGNOSIS — C186 Malignant neoplasm of descending colon: Secondary | ICD-10-CM | POA: Diagnosis not present

## 2019-05-01 ENCOUNTER — Other Ambulatory Visit: Payer: Self-pay | Admitting: Oncology

## 2019-05-01 NOTE — Progress Notes (Signed)
Cardiology Office Note   Date:  05/02/2019   ID:  David, [name] [birth year], MRN [id number]  PCP:  Orpah Melter, MD    No chief complaint on file.  Mitral regurgitation  Wt Readings from Last 3 Encounters:  05/02/19 197 lb (89.4 kg)  05/02/19 195 lb 6.4 oz (88.6 kg)  04/18/19 195 lb (88.5 kg)       History of Present Illness: [name] is a 54 y.o. male  who has a family h/o CAD at a young age.  Wilms tumor, s/p nephrectomy- . He has had HTN for over 10 years. BP is usually controlled on meds. He had lipoma removal and then a skin infection in 2015. He was given an ABx and then he continued to feel poorly. 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 He completed a 6 week course of antibiotics to treat enterococcus in his blood. He was evaluated by cardiac surgery as well. He had moderate mitral regurgitation noted on his echocardiogram in January 2016. He is being managed medically.  Family history includes father with CABG, bilateral CEA. CABG at about 20.He also had an ablation. Mother as low BP and thyroid cancer. Sister has not had problems. Paternal GM with stroke.   Most recent echo in August 2018 and showed mild to moderate MR with EF 50%.  No further appts with CT surgery planned.  Wears compression stockings when he travels. THis helps with swelling.  He has raced cars competitively.  He may need a knee replacement in the future.    He was diagnosed with colon cancer in 12/2018.  He had successful surgery by Dr. Johney Maine.   He is receiving chemo through June.  At surgery, 8/26 LN were positive. He is tolerating chemo. Has some mild nausea.  BP has been high at the Cancer center. Better at home.   Denies : Chest pain. Dizziness. Leg edema. Nitroglycerin use. Orthopnea. Palpitations. Paroxysmal nocturnal dyspnea.  Syncope.   Mild DOE with walking.   .   Past Medical History:    Diagnosis Date  . Allergic rhinitis   . Asthma    animal dander & seasonal asthma  . Colon cancer ([facility]) 01/26/2019  . Diabetes mellitus without complication ([facility])    dx 2010  type 2  . Discitis of lumbosacral region    first started with this ..and rolled onto the endocarditis    stayed a month  . 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 Family history of colon cancer   . Family history of thyroid cancer   . H/O scoliosis   . 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 History of hiatal hernia   . Hyperlipidemia   . Hypertension    Diagnostic exercise tolerance test assessment:04/30/2010 : comments normal -no evidence os ischemia by ST analysis  . lipoma   . Lipoma 01/26/2019  . Mitral regurgitation    Echo 12/2018: EF 60-65, myxomatous mitral valve, severe mitral regurgitation, partial flail leaflet of posterior mitral valve, myxomatous tricuspid valve with trivial TR, ascending aorta mildly dilated (39 mm)  . Pneumonia   . PONV (postoperative nausea and vomiting)   . Solitary kidney   . 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 BIOPSY  01/07/2019   Procedure: BIOPSY;  Surgeon: Wilford Corner, MD;  Location: [facility];  Service: Endoscopy;;  . COLON SURGERY  03/04/2019   left colon segmental resection  . COLONOSCOPY WITH PROPOFOL N/A 01/07/2019   Procedure: COLONOSCOPY WITH PROPOFOL;  Surgeon: Wilford Corner, MD;  Location: [facility];  Service: Endoscopy;  Laterality: N/A;  . INGUINAL HERNIA REPAIR Left 04/23/2016   Procedure: LAPAROSCOPIC  INGUINAL REPAIR;  Surgeon: Clovis Riley, MD;  Location: [facility];  Service: General;  Laterality: Left;  . KNEE SURGERY     arthroscopic left knee  . LIPOMA EXCISION  2015   x20 [facility]  . PORTACATH PLACEMENT N/A 03/16/2019   Procedure: INSERTION PORT-A-CATH;  Surgeon: Michael Boston, MD;  Location: [facility];  Service: General;   Laterality: N/A;  . SUBMUCOSAL INJECTION  01/07/2019   Procedure: SUBMUCOSAL INJECTION;  Surgeon: Wilford Corner, MD;  Location: [facility];  Service: Endoscopy;;  . THYROID SURGERY  2010  . TONSILLECTOMY    . TOTAL NEPHRECTOMY Left 1970   Wilms Tumor left kidney excised as an infant     Current Outpatient Medications  Medication Sig Dispense Refill  . albuterol (PROVENTIL HFA;VENTOLIN HFA) 108 (90 BASE) MCG/ACT inhaler Inhale 1 puff into the lungs every 6 (six) hours as needed for wheezing or shortness of breath.    Marland Kitchen aspirin EC 81 MG tablet Take 81 mg by mouth every evening.     Marland Kitchen atorvastatin (LIPITOR) 10 MG tablet Take 1 tablet (10 mg total) by mouth daily. 30 tablet 1  . calcium carbonate (TUMS - DOSED IN MG ELEMENTAL CALCIUM) 500 MG chewable tablet Chew 1 tablet by mouth daily as needed for indigestion or heartburn.    . lidocaine-prilocaine (EMLA) cream Apply 1 application topically as directed. Apply to port 1 hour prior to stick and cover with plastic wrap 30 g 3  . lisinopril (PRINIVIL,ZESTRIL) 2.5 MG tablet Take 1 tablet (2.5 mg total) by mouth daily. 30 tablet 1  . loratadine (CLARITIN) 10 MG tablet Take 10 mg by mouth daily as needed for allergies.    . metFORMIN (GLUCOPHAGE) 1000 MG tablet Take 1,000 mg by mouth 2 (two) times daily.     . mometasone (ASMANEX) 220 MCG/INH inhaler Inhale 1 puff into the lungs daily.    . Omega-3 Fatty Acids (FISH OIL) 1000 MG CAPS Take 1,000 mg by mouth daily.     . ondansetron (ZOFRAN) 8 MG tablet Take 1 tablet (8 mg total) by mouth every 8 (eight) hours as needed for nausea or vomiting. Begin 72 hours after IV chemo treatment 30 tablet 1  . ONE TOUCH ULTRA TEST test strip 1 each by Other route as needed (GLUCOSE).     Marland Kitchen prochlorperazine (COMPAZINE) 10 MG tablet Take 1 tablet (10 mg total) by mouth every 6 (six) hours as needed for nausea. 60 tablet 1  . repaglinide (PRANDIN) 1 MG tablet Take 1 mg by mouth 2 (two) times daily before a meal.      . TOPROL XL 25 MG 24 hr tablet Take 25 mg by mouth daily.     . traMADol (ULTRAM) 50 MG tablet Take 1-2 tablets (50-100 mg total) by mouth every 6 (six) hours as needed for moderate pain or severe pain. 20 tablet 0   No current facility-administered medications for this visit.    Allergies:   Patient has no known allergies.    Social History:  The patient  reports that he has never smoked. He has never used smokeless tobacco. He reports current alcohol use of about  1.0 standard drinks of alcohol per week. He reports that he does not use drugs.   Family History:  The patient's family history includes Cancer in his mother; Colon cancer in an other family member; Colon cancer (age of onset: 38) in his maternal aunt; Colon cancer (age of onset: 72) in an other family member; Congestive Heart Failure in his maternal grandfather, maternal grandmother, and paternal uncle; Healthy in his sister; Heart attack in his maternal grandfather and paternal grandfather; Heart disease in his father; Hernia in his father; Hypotension in his mother; Stroke in his paternal grandmother.    ROS:  Please see the history of present illness.   Otherwise, review of systems are positive for appetite normalizing after starting chemo.   All other systems are reviewed and negative.    PHYSICAL EXAM: VS:  BP 134/86   Pulse 98   Ht 6' (1.829 m)   Wt 197 lb (89.4 kg)   SpO2 95%   BMI 26.72 kg/m  , BMI Body mass index is 26.72 kg/m. GEN: Well nourished, well developed, in no acute distress  HEENT: normal  Neck: no JVD, carotid bruits, or masses Cardiac: RRR; 2/6 systolic murmur at L lower sternal border, no rubs, or gallops,no edema  Respiratory:  clear to auscultation bilaterally, normal work of breathing GI: soft, nontender, nondistended, + BS MS: no deformity or atrophy  Skin: warm and dry, no rash Neuro:  Strength and sensation are intact Psych: euthymic mood, full affect   EKG:   The ekg ordered  12/2018 demonstrates NSR   Recent Labs: 03/05/2019: Magnesium 1.6 05/02/2019: ALT 62; BUN 18; Creatinine 1.05; Hemoglobin 14.8; Platelet Count 63; Potassium 4.7; Sodium 138   Lipid Panel    Component Value Date/Time   CHOL 116 03/05/2014 0630   TRIG 122 03/05/2014 0630   HDL 21 (L) 03/05/2014 0630   CHOLHDL 5.5 03/05/2014 0630   VLDL 24 03/05/2014 0630   LDLCALC 71 03/05/2014 0630     Other studies Reviewed: Additional studies/ records that were reviewed today with results demonstrating: ECho 12/2018: Normal LVEF. "Partially flail leaflet of posterior mitral valve. Mitral  regurgitation is severe, with jet wrapping around left atrium. ".  I personally reviewed the echo images.    ASSESSMENT AND PLAN:  1. Mitral regurgitation : Due to prior endocarditis.  Normal LV size and function.  Will eventually need MV repair.  Will have to follow LV size and function closely with annual echos. Echo in 12/2018.   2. Family h/o CAD: No change from prior. Father with ablations and CEA.   3. DM:  A1C 7.4 in 03/2019.  Metformin was increased.   4. H/o endocarditis: Needs SBE prophylaxis.  5. HTN: Controlled at home. 6. I recommended cancer screenings for him in general given his h/o multiple cancers.    Current medicines are reviewed at length with the patient today.  The patient concerns regarding his medicines were addressed.  The following changes have been made:  No change  Labs/ tests ordered today include:  No orders of the defined types were placed in this encounter.   Recommend 150 minutes/week of aerobic exercise Low fat, low carb, high fiber diet recommended  Disposition:   FU in 1 year   Signed, Larae Grooms, MD  05/02/2019 3:29 [facility] [place], [place], [place]  [address] Phone: [phone]; Fax: [phone]

## 2019-05-02 ENCOUNTER — Inpatient Hospital Stay: Payer: BC Managed Care – PPO

## 2019-05-02 ENCOUNTER — Encounter: Payer: Self-pay | Admitting: Interventional Cardiology

## 2019-05-02 ENCOUNTER — Ambulatory Visit: Payer: BC Managed Care – PPO | Admitting: Interventional Cardiology

## 2019-05-02 ENCOUNTER — Other Ambulatory Visit: Payer: Self-pay

## 2019-05-02 ENCOUNTER — Inpatient Hospital Stay: Payer: BC Managed Care – PPO | Attending: Genetic Counselor | Admitting: Oncology

## 2019-05-02 VITALS — BP 134/86 | HR 98 | Ht 72.0 in | Wt 197.0 lb

## 2019-05-02 VITALS — BP 146/92 | HR 91 | Temp 98.5°F | Resp 19 | Ht 72.0 in | Wt 195.4 lb

## 2019-05-02 DIAGNOSIS — C184 Malignant neoplasm of transverse colon: Secondary | ICD-10-CM | POA: Insufficient documentation

## 2019-05-02 DIAGNOSIS — C186 Malignant neoplasm of descending colon: Secondary | ICD-10-CM

## 2019-05-02 DIAGNOSIS — Z95828 Presence of other vascular implants and grafts: Secondary | ICD-10-CM

## 2019-05-02 DIAGNOSIS — Z8249 Family history of ischemic heart disease and other diseases of the circulatory system: Secondary | ICD-10-CM

## 2019-05-02 DIAGNOSIS — I10 Essential (primary) hypertension: Secondary | ICD-10-CM | POA: Diagnosis not present

## 2019-05-02 DIAGNOSIS — Z5111 Encounter for antineoplastic chemotherapy: Secondary | ICD-10-CM | POA: Insufficient documentation

## 2019-05-02 DIAGNOSIS — I340 Nonrheumatic mitral (valve) insufficiency: Secondary | ICD-10-CM | POA: Diagnosis not present

## 2019-05-02 DIAGNOSIS — E119 Type 2 diabetes mellitus without complications: Secondary | ICD-10-CM

## 2019-05-02 DIAGNOSIS — Z8679 Personal history of other diseases of the circulatory system: Secondary | ICD-10-CM

## 2019-05-02 LAB — CBC WITH DIFFERENTIAL (CANCER CENTER ONLY)
Abs Immature Granulocytes: 0.02 10*3/uL (ref 0.00–0.07)
Basophils Absolute: 0 10*3/uL (ref 0.0–0.1)
Basophils Relative: 1 %
Eosinophils Absolute: 0.2 10*3/uL (ref 0.0–0.5)
Eosinophils Relative: 3 %
HCT: 42.4 % (ref 39.0–52.0)
Hemoglobin: 14.8 g/dL (ref 13.0–17.0)
Immature Granulocytes: 0 %
Lymphocytes Relative: 17 %
Lymphs Abs: 0.9 10*3/uL (ref 0.7–4.0)
MCH: 33.3 pg (ref 26.0–34.0)
MCHC: 34.9 g/dL (ref 30.0–36.0)
MCV: 95.3 fL (ref 80.0–100.0)
Monocytes Absolute: 0.5 10*3/uL (ref 0.1–1.0)
Monocytes Relative: 11 %
Neutro Abs: 3.4 10*3/uL (ref 1.7–7.7)
Neutrophils Relative %: 68 %
Platelet Count: 63 10*3/uL — ABNORMAL LOW (ref 150–400)
RBC: 4.45 MIL/uL (ref 4.22–5.81)
RDW: 15.1 % (ref 11.5–15.5)
WBC Count: 5 10*3/uL (ref 4.0–10.5)
nRBC: 0 % (ref 0.0–0.2)

## 2019-05-02 LAB — CMP (CANCER CENTER ONLY)
ALT: 62 U/L — ABNORMAL HIGH (ref 0–44)
AST: 40 U/L (ref 15–41)
Albumin: 3.5 g/dL (ref 3.5–5.0)
Alkaline Phosphatase: 50 U/L (ref 38–126)
Anion gap: 8 (ref 5–15)
BUN: 18 mg/dL (ref 6–20)
CO2: 24 mmol/L (ref 22–32)
Calcium: 8.6 mg/dL — ABNORMAL LOW (ref 8.9–10.3)
Chloride: 106 mmol/L (ref 98–111)
Creatinine: 1.05 mg/dL (ref 0.61–1.24)
GFR, Est AFR Am: >60 mL/min (ref >60)
GFR, Estimated: >60 mL/min (ref >60)
Glucose, Bld: 187 mg/dL — ABNORMAL HIGH (ref 70–99)
Potassium: 4.7 mmol/L (ref 3.5–5.1)
Sodium: 138 mmol/L (ref 135–145)
Total Bilirubin: 0.8 mg/dL (ref 0.3–1.2)
Total Protein: 6.6 g/dL (ref 6.5–8.1)

## 2019-05-02 MED ORDER — DEXTROSE 5 % IV SOLN
Freq: Once | INTRAVENOUS | Status: AC
  Filled 2019-05-02: qty 250

## 2019-05-02 MED ORDER — FLUOROURACIL CHEMO INJECTION 2.5 GM/50ML
400.0000 mg/m2 | Freq: Once | INTRAVENOUS | Status: AC
  Administered 2019-05-02: 850 mg via INTRAVENOUS
  Filled 2019-05-02: qty 17

## 2019-05-02 MED ORDER — SODIUM CHLORIDE 0.9% FLUSH
10.0000 mL | INTRAVENOUS | Status: DC | PRN
  Administered 2019-05-02: 10 mL via INTRAVENOUS
  Filled 2019-05-02: qty 10

## 2019-05-02 MED ORDER — SODIUM CHLORIDE 0.9 % IV SOLN
2400.0000 mg/m2 | INTRAVENOUS | Status: DC
  Administered 2019-05-02: 5000 mg via INTRAVENOUS
  Filled 2019-05-02: qty 100

## 2019-05-02 MED ORDER — LEUCOVORIN CALCIUM INJECTION 350 MG
400.0000 mg/m2 | Freq: Once | INTRAVENOUS | Status: AC
  Administered 2019-05-02: 836 mg via INTRAVENOUS
  Filled 2019-05-02: qty 41.8

## 2019-05-02 NOTE — Patient Instructions (Addendum)
Medication Instructions:  Your physician recommends that you continue on your current medications as directed. Please refer to the Current Medication list given to you today.  *If you need a refill on your cardiac medications before your next appointment, please call your pharmacy*  Lab Work: None ordered  If you have labs (blood work) drawn today and your tests are completely normal, you will receive your results only by: Marland Kitchen MyChart Message (if you have MyChart) OR . A paper copy in the mail If you have any lab test that is abnormal or we need to change your treatment, we will call you to review the results.  Testing/Procedures: Your physician has requested that you have an echocardiogram in October 2021. Echocardiography is a painless test that uses sound waves to create images of your heart. It provides your doctor with information about the size and shape of your heart and how well your heart's chambers and valves are working. This procedure takes approximately one hour. There are no restrictions for this procedure.   Follow-Up: At [facility], you and your health needs are our priority.  As part of our continuing mission to provide you with exceptional heart care, we have created designated Provider Care Teams.  These Care Teams include your primary Cardiologist (physician) and Advanced Practice Providers (APPs -  Physician Assistants and Nurse Practitioners) who all work together to provide you with the care you need, when you need it.  Your next appointment:   8 month(s)   The format for your next appointment:   In Person  Provider:   You may see Larae Grooms, MD or one of the following Advanced Practice Providers on your designated Care Team:    Melina Copa, PA-C  Ermalinda Barrios, PA-C   Other Instructions  Please establish with a Primary Care Doctor:  SuggestionSadie Haber at [facility]: Dr. Laurann Montana, Inda Merlin, [place], or [place]

## 2019-05-02 NOTE — Progress Notes (Signed)
[facility] OFFICE PROGRESS NOTE   Diagnosis: Colon cancer  INTERVAL HISTORY:   [name] completed another cycle of FOLFOX on 04/18/2019.  No nausea, mouth sores, or diarrhea.  Cold sensitivity for 2 days following chemotherapy.  This has resolved.  He has noted mild erythema at the lateral aspect of the Port-A-Cath incision.  No fever or tenderness.  Objective:  Vital signs in last 24 hours:  Blood pressure (!) 146/92, pulse 91, temperature 98.5 F (36.9 C), temperature source Temporal, resp. rate 19, height 6' (1.829 m), weight 195 lb 6.4 oz (88.6 kg), SpO2 98 %.    GI: No hepatosplenomegaly, nontender Vascular: No leg edema  Skin: Palms without erythema  Portacath/PICC-mild erythema with a 2 to 3 mm eschar at the lateral aspect of the Port-A-Cath incision.  Mild erythema at the right neck puncture site.  No tenderness along the subcutaneous tract.  No fluctuance.  Lab Results:  Lab Results  Component Value Date   WBC 5.0 05/02/2019   HGB 14.8 05/02/2019   HCT 42.4 05/02/2019   MCV 95.3 05/02/2019   PLT 63 (L) 05/02/2019   NEUTROABS 3.4 05/02/2019    CMP  Lab Results  Component Value Date   NA 137 04/18/2019   K 4.0 04/18/2019   CL 106 04/18/2019   CO2 21 (L) 04/18/2019   GLUCOSE 240 (H) 04/18/2019   BUN 18 04/18/2019   CREATININE 1.05 04/18/2019   CALCIUM 8.3 (L) 04/18/2019   PROT 6.4 (L) 04/18/2019   ALBUMIN 3.5 04/18/2019   AST 23 04/18/2019   ALT 31 04/18/2019   ALKPHOS 53 04/18/2019   BILITOT 0.7 04/18/2019   GFRNONAA >60 04/18/2019   GFRAA >60 04/18/2019    Medications: I have reviewed the patient's current medications.   Assessment/Plan: 1. 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 Mass felt to be in the transverse colon on a colonoscopy 01/07/2019 with a biopsy confirming poorly differentiated  adenocarcinoma with signet ring cell features, mismatch repair protein expression normal ? CT abdomen/pelvis 01/08/2019 12.3 cm indeterminate splenic mass, present in 2015 on a lumbar MRI-felt to be benign, small subpleural and pleural-based nodules at the lung bases, status post left nephrectomy ? CT chest 01/20/2019-small bilateral pulmonary nodules with rounded parenchymal nodules in the right lower lobe, indeterminate splenic mass ? CT chest 03/18/2019-multiple subcentimeter pulmonary nodules again seen bilaterally, greatest in the lower lobes, without significant change.  No new or enlarging pulmonary nodules or masses.  Partially visualized large heterogeneous splenic mass with no significant change.  Plan for follow-up chest CT at a 48-monthinterval. ? Cycle 1 FOLFOX 03/21/2019 ? Cycle 2 FOLFOX 04/04/2019 ? Cycle 3 FOLFOX 04/18/2019 (oxaliplatin dose reduced secondary to thrombocytopenia) ? Cycle 4 FOLFOX 05/02/2019 (oxaliplatin held secondary to thrombocytopenia)  2. Multiple colon polyps-ascending colon polyps on the colonoscopy 01/07/2019-not removed 3. Wilms tumor at age 54 status post chemotherapy[place]radiation and [place]. 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 Lumbar discitis 2015 7. Diabetes 8. Asthma 9. Multiple lipomas 10. Family history of colon cancer  [employer] panel 2020-POLE VUS 11.  Left nephrectomy at age 54 12  Port-A-Cath placement, Dr. GJohney Maine 03/16/2019 13.  Thrombocytopenia secondary to chemotherapy-oxaliplatin dose reduced beginning with cycle 3 FOLFOX     Disposition: [name] well.  He will complete cycle 4 adjuvant  chemotherapy today.  He continues to tolerate the chemotherapy well.  He has moderate thrombocytopenia.  Oxaliplatin will be held from the chemotherapy regimen with this cycle.  He will call for spontaneous bleeding or bruising.  We will discontinue  aspirin if the platelet count falls further.  He has mild erythema at the right neck IV puncture site and at the lateral aspect of the Port-A-Cath incision.  He will apply Neosporin.  He will call for increased erythema, tenderness, or fever.  [name] will return for an office visit and chemotherapy in 2 weeks.  Betsy Coder, MD  05/02/2019  8:46 AM

## 2019-05-02 NOTE — Progress Notes (Signed)
Leucovorin will infuse over 30 min since MD holding [place] today for thrombocytopenia. Have informed RN and adj start times for 5FU.  Kennith Center, Pharm.D., CPP 05/02/2019@9 :36 AM

## 2019-05-02 NOTE — Patient Instructions (Signed)

## 2019-05-02 NOTE — Patient Instructions (Signed)
[facility] Discharge Instructions for Patients Receiving Chemotherapy  Today you received the following chemotherapy agents: Leucovorin, 5FU  To help prevent nausea and vomiting after your treatment, we encourage you to take your nausea medication as directed.   If you develop nausea and vomiting that is not controlled by your nausea medication, call the clinic.   BELOW ARE SYMPTOMS THAT SHOULD BE REPORTED IMMEDIATELY:  *FEVER GREATER THAN 100.5 F  *CHILLS WITH OR WITHOUT FEVER  NAUSEA AND VOMITING THAT IS NOT CONTROLLED WITH YOUR NAUSEA MEDICATION  *UNUSUAL SHORTNESS OF BREATH  *UNUSUAL BRUISING OR BLEEDING  TENDERNESS IN MOUTH AND THROAT WITH OR WITHOUT PRESENCE OF ULCERS  *URINARY PROBLEMS  *BOWEL PROBLEMS  UNUSUAL RASH Items with * indicate a potential emergency and should be followed up as soon as possible.  Feel free to call the clinic should you have any questions or concerns. The clinic phone number is [phone].  Please show the CHEMO ALERT CARD at check-in to the Emergency Department and triage nurse.   

## 2019-05-02 NOTE — Progress Notes (Signed)
Per Dr. Benay Spice: OK to treat today with platelets 63,000. Will hold oxaliplatin this cycle.

## 2019-05-04 ENCOUNTER — Other Ambulatory Visit: Payer: Self-pay

## 2019-05-04 ENCOUNTER — Inpatient Hospital Stay: Payer: BC Managed Care – PPO

## 2019-05-04 ENCOUNTER — Telehealth: Payer: Self-pay | Admitting: Oncology

## 2019-05-04 VITALS — BP 167/91 | HR 93 | Resp 18

## 2019-05-04 DIAGNOSIS — C186 Malignant neoplasm of descending colon: Secondary | ICD-10-CM

## 2019-05-04 DIAGNOSIS — Z5111 Encounter for antineoplastic chemotherapy: Secondary | ICD-10-CM | POA: Diagnosis not present

## 2019-05-04 DIAGNOSIS — C184 Malignant neoplasm of transverse colon: Secondary | ICD-10-CM | POA: Diagnosis not present

## 2019-05-04 MED ORDER — SODIUM CHLORIDE 0.9% FLUSH
10.0000 mL | INTRAVENOUS | Status: DC | PRN
  Administered 2019-05-04: 09:00:00 10 mL
  Filled 2019-05-04: qty 10

## 2019-05-04 MED ORDER — HEPARIN SOD (PORK) LOCK FLUSH 100 UNIT/ML IV SOLN
500.0000 [IU] | Freq: Once | INTRAVENOUS | Status: AC | PRN
  Administered 2019-05-04: 09:00:00 500 [IU]
  Filled 2019-05-04: qty 5

## 2019-05-04 NOTE — Telephone Encounter (Signed)
Scheduled per los. Called and left msg. Mailed printout  °

## 2019-05-09 DIAGNOSIS — J45909 Unspecified asthma, uncomplicated: Secondary | ICD-10-CM | POA: Diagnosis not present

## 2019-05-09 DIAGNOSIS — I10 Essential (primary) hypertension: Secondary | ICD-10-CM | POA: Diagnosis not present

## 2019-05-09 DIAGNOSIS — E1169 Type 2 diabetes mellitus with other specified complication: Secondary | ICD-10-CM | POA: Diagnosis not present

## 2019-05-09 DIAGNOSIS — E7800 Pure hypercholesterolemia, unspecified: Secondary | ICD-10-CM | POA: Diagnosis not present

## 2019-05-14 ENCOUNTER — Ambulatory Visit: Payer: BC Managed Care – PPO

## 2019-05-15 ENCOUNTER — Other Ambulatory Visit: Payer: Self-pay | Admitting: Oncology

## 2019-05-16 ENCOUNTER — Inpatient Hospital Stay: Payer: BC Managed Care – PPO

## 2019-05-16 ENCOUNTER — Other Ambulatory Visit: Payer: Self-pay

## 2019-05-16 ENCOUNTER — Inpatient Hospital Stay: Payer: BC Managed Care – PPO | Admitting: Oncology

## 2019-05-16 VITALS — BP 140/86 | HR 99 | Temp 98.3°F | Resp 17 | Ht 72.0 in | Wt 195.4 lb

## 2019-05-16 DIAGNOSIS — Z5111 Encounter for antineoplastic chemotherapy: Secondary | ICD-10-CM | POA: Diagnosis not present

## 2019-05-16 DIAGNOSIS — Z95828 Presence of other vascular implants and grafts: Secondary | ICD-10-CM

## 2019-05-16 DIAGNOSIS — C186 Malignant neoplasm of descending colon: Secondary | ICD-10-CM

## 2019-05-16 DIAGNOSIS — C184 Malignant neoplasm of transverse colon: Secondary | ICD-10-CM | POA: Diagnosis not present

## 2019-05-16 LAB — CMP (CANCER CENTER ONLY)
ALT: 34 U/L (ref 0–44)
AST: 26 U/L (ref 15–41)
Albumin: 3.6 g/dL (ref 3.5–5.0)
Alkaline Phosphatase: 52 U/L (ref 38–126)
Anion gap: 10 (ref 5–15)
BUN: 16 mg/dL (ref 6–20)
CO2: 24 mmol/L (ref 22–32)
Calcium: 8.8 mg/dL — ABNORMAL LOW (ref 8.9–10.3)
Chloride: 105 mmol/L (ref 98–111)
Creatinine: 1.02 mg/dL (ref 0.61–1.24)
GFR, Est AFR Am: >60 mL/min (ref >60)
GFR, Estimated: >60 mL/min (ref >60)
Glucose, Bld: 253 mg/dL — ABNORMAL HIGH (ref 70–99)
Potassium: 4.3 mmol/L (ref 3.5–5.1)
Sodium: 139 mmol/L (ref 135–145)
Total Bilirubin: 0.8 mg/dL (ref 0.3–1.2)
Total Protein: 6.6 g/dL (ref 6.5–8.1)

## 2019-05-16 LAB — CBC WITH DIFFERENTIAL (CANCER CENTER ONLY)
Abs Immature Granulocytes: 0.03 10*3/uL (ref 0.00–0.07)
Basophils Absolute: 0 10*3/uL (ref 0.0–0.1)
Basophils Relative: 1 %
Eosinophils Absolute: 0.2 10*3/uL (ref 0.0–0.5)
Eosinophils Relative: 4 %
HCT: 42.7 % (ref 39.0–52.0)
Hemoglobin: 14.8 g/dL (ref 13.0–17.0)
Immature Granulocytes: 1 %
Lymphocytes Relative: 21 %
Lymphs Abs: 0.8 10*3/uL (ref 0.7–4.0)
MCH: 33.4 pg (ref 26.0–34.0)
MCHC: 34.7 g/dL (ref 30.0–36.0)
MCV: 96.4 fL (ref 80.0–100.0)
Monocytes Absolute: 0.5 10*3/uL (ref 0.1–1.0)
Monocytes Relative: 13 %
Neutro Abs: 2.4 10*3/uL (ref 1.7–7.7)
Neutrophils Relative %: 60 %
Platelet Count: 81 10*3/uL — ABNORMAL LOW (ref 150–400)
RBC: 4.43 MIL/uL (ref 4.22–5.81)
RDW: 14.8 % (ref 11.5–15.5)
WBC Count: 3.9 10*3/uL — ABNORMAL LOW (ref 4.0–10.5)
nRBC: 0 % (ref 0.0–0.2)

## 2019-05-16 MED ORDER — PALONOSETRON HCL INJECTION 0.25 MG/5ML
INTRAVENOUS | Status: AC
  Filled 2019-05-16: qty 5

## 2019-05-16 MED ORDER — LEUCOVORIN CALCIUM INJECTION 350 MG
400.0000 mg/m2 | Freq: Once | INTRAVENOUS | Status: AC
  Administered 2019-05-16: 836 mg via INTRAVENOUS
  Filled 2019-05-16: qty 41.8

## 2019-05-16 MED ORDER — DEXAMETHASONE SODIUM PHOSPHATE 10 MG/ML IJ SOLN
INTRAMUSCULAR | Status: AC
  Filled 2019-05-16: qty 1

## 2019-05-16 MED ORDER — FLUOROURACIL CHEMO INJECTION 2.5 GM/50ML
400.0000 mg/m2 | Freq: Once | INTRAVENOUS | Status: AC
  Administered 2019-05-16: 850 mg via INTRAVENOUS
  Filled 2019-05-16: qty 17

## 2019-05-16 MED ORDER — DEXAMETHASONE SODIUM PHOSPHATE 10 MG/ML IJ SOLN
10.0000 mg | Freq: Once | INTRAMUSCULAR | Status: AC
  Administered 2019-05-16: 10 mg via INTRAVENOUS

## 2019-05-16 MED ORDER — SODIUM CHLORIDE 0.9 % IV SOLN
2400.0000 mg/m2 | INTRAVENOUS | Status: DC
  Administered 2019-05-16: 5000 mg via INTRAVENOUS
  Filled 2019-05-16: qty 100

## 2019-05-16 MED ORDER — DEXTROSE 5 % IV SOLN
Freq: Once | INTRAVENOUS | Status: AC
  Filled 2019-05-16: qty 250

## 2019-05-16 MED ORDER — PALONOSETRON HCL INJECTION 0.25 MG/5ML
0.2500 mg | Freq: Once | INTRAVENOUS | Status: AC
  Administered 2019-05-16: 0.25 mg via INTRAVENOUS

## 2019-05-16 MED ORDER — SODIUM CHLORIDE 0.9% FLUSH
10.0000 mL | INTRAVENOUS | Status: DC | PRN
  Administered 2019-05-16: 09:00:00 10 mL via INTRAVENOUS
  Filled 2019-05-16: qty 10

## 2019-05-16 MED ORDER — OXALIPLATIN CHEMO INJECTION 100 MG/20ML
65.0000 mg/m2 | Freq: Once | INTRAVENOUS | Status: AC
  Administered 2019-05-16: 135 mg via INTRAVENOUS
  Filled 2019-05-16: qty 27

## 2019-05-16 NOTE — Progress Notes (Signed)
[facility] OFFICE PROGRESS NOTE   Diagnosis: Colon cancer  INTERVAL HISTORY:   [name] returns as scheduled.  He completed a cycle of 5-fluorouracil on 05/02/2019.  He reports mild nausea on chemotherapy.  He had soft stool for several days.  No diarrhea.  No hand or foot pain.  He feels well at present.  He reports mild bleeding when he blows his nose.  No other bleeding.  He is no longer taking aspirin. The Port-A-Cath site has less erythema.  He is using Neosporin. Objective:  Vital signs in last 24 hours:  Blood pressure 140/86, pulse 99, temperature 98.3 F (36.8 C), temperature source Temporal, resp. rate 17, height 6' (1.829 m), weight 195 lb 6.4 oz (88.6 kg), SpO2 99 %.    Limited physical examination secondary to distancing with the Covid pandemic GI: Nontender, no hepatosplenomegaly Vascular: No leg edema  Skin: Palms without erythema  Portacath/PICC-without erythema  Lab Results:  Lab Results  Component Value Date   WBC 3.9 (L) 05/16/2019   HGB 14.8 05/16/2019   HCT 42.7 05/16/2019   MCV 96.4 05/16/2019   PLT 81 (L) 05/16/2019   NEUTROABS 2.4 05/16/2019    CMP  Lab Results  Component Value Date   NA 139 05/16/2019   K 4.3 05/16/2019   CL 105 05/16/2019   CO2 24 05/16/2019   GLUCOSE 253 (H) 05/16/2019   BUN 16 05/16/2019   CREATININE 1.02 05/16/2019   CALCIUM 8.8 (L) 05/16/2019   PROT 6.6 05/16/2019   ALBUMIN 3.6 05/16/2019   AST 26 05/16/2019   ALT 34 05/16/2019   ALKPHOS 52 05/16/2019   BILITOT 0.8 05/16/2019   GFRNONAA >60 05/16/2019   GFRAA >60 05/16/2019    Medications: I have reviewed the patient's current medications.   Assessment/Plan: 1. 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 Mass felt to be in the transverse colon on a colonoscopy 01/07/2019 with a biopsy confirming poorly  differentiated adenocarcinoma with signet ring cell features, mismatch repair protein expression normal ? CT abdomen/pelvis 01/08/2019 12.3 cm indeterminate splenic mass, present in 2015 on a lumbar MRI-felt to be benign, small subpleural and pleural-based nodules at the lung bases, status post left nephrectomy ? CT chest 01/20/2019-small bilateral pulmonary nodules with rounded parenchymal nodules in the right lower lobe, indeterminate splenic mass ? CT chest 03/18/2019-multiple subcentimeter pulmonary nodules again seen bilaterally, greatest in the lower lobes, without significant change.  No new or enlarging pulmonary nodules or masses.  Partially visualized large heterogeneous splenic mass with no significant change.  Plan for follow-up chest CT at a 75-monthinterval. ? Cycle 1 FOLFOX 03/21/2019 ? Cycle 2 FOLFOX 04/04/2019 ? Cycle 3 FOLFOX 04/18/2019 (oxaliplatin dose reduced secondary to thrombocytopenia) ? Cycle 4 FOLFOX 05/02/2019 (oxaliplatin held secondary to thrombocytopenia) ? Cycle 5 FOLFOX 05/16/2019  2. Multiple colon polyps-ascending colon polyps on the colonoscopy 01/07/2019-not removed 3. Wilms tumor at age 54 status post chemotherapy/radiation and a nephrectomy in [place]. 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 Lumbar discitis 2015 7. Diabetes 8. Asthma 9. Multiple lipomas 10. Family history of colon cancer  [employer] panel 2020-POLE VUS 11.  Left nephrectomy at age [age]  Port-A-Cath placement, Dr. GJohney Maine 03/16/2019 13.  Thrombocytopenia secondary to chemotherapy-oxaliplatin dose reduced beginning with cycle 3 FOLFOX       Disposition: Mr. JSchollmeyer  appears well.  He will complete another cycle of FOLFOX today.  The platelet count is mildly low.  Oxaliplatin will be resumed with this cycle.  He will call for bleeding or bruising.  He will return for an office visit and chemotherapy in 2  weeks.  Betsy Coder, MD  05/16/2019  10:17 AM

## 2019-05-16 NOTE — Patient Instructions (Signed)
[facility] Discharge Instructions for Patients Receiving Chemotherapy  Today you received the following chemotherapy agents: Oxaliplatin (Eloxatin), Leucovorin, Fluorouracil (Adrucil, 5-FU)  To help prevent nausea and vomiting after your treatment, we encourage you to take your nausea medication as directed. Received Aloxi during your treatment today. For the next 3 days use your Compazine prescription as needed for break through nausea. Starting Thursday you can add your Zofran prescription into your regimen if Compazine isn't enough.   If you develop nausea and vomiting that is not controlled by your nausea medication, call the clinic.   BELOW ARE SYMPTOMS THAT SHOULD BE REPORTED IMMEDIATELY:  *FEVER GREATER THAN 100.5 F  *CHILLS WITH OR WITHOUT FEVER  NAUSEA AND VOMITING THAT IS NOT CONTROLLED WITH YOUR NAUSEA MEDICATION  *UNUSUAL SHORTNESS OF BREATH  *UNUSUAL BRUISING OR BLEEDING  TENDERNESS IN MOUTH AND THROAT WITH OR WITHOUT PRESENCE OF ULCERS  *URINARY PROBLEMS  *BOWEL PROBLEMS  UNUSUAL RASH Items with * indicate a potential emergency and should be followed up as soon as possible.  Feel free to call the clinic should you have any questions or concerns. The clinic phone number is [phone].  Please show the [facility] at check-in to the Emergency Department and triage nurse.  Oxaliplatin Injection What is this medicine? OXALIPLATIN (ox AL i PLA tin) is a chemotherapy drug. It targets fast dividing cells, like cancer cells, and causes these cells to die. This medicine is used to treat cancers of the colon and rectum, and many other cancers. This medicine may be used for other purposes; ask your health care provider or pharmacist if you have questions. COMMON BRAND NAME(S): Eloxatin What should I tell my health care provider before I take this medicine? They need to know if you have any of these conditions:  kidney disease  an unusual or  allergic reaction to oxaliplatin, other chemotherapy, other medicines, foods, dyes, or preservatives  pregnant or trying to get pregnant  breast-feeding How should I use this medicine? This drug is given as an infusion into a vein. It is administered in a hospital or clinic by a specially trained health care professional. Talk to your pediatrician regarding the use of this medicine in children. Special care may be needed. Overdosage: If you think you have taken too much of this medicine contact a poison control center or emergency room at once. NOTE: This medicine is only for you. Do not share this medicine with others. What if I miss a dose? It is important not to miss a dose. Call your doctor or health care professional if you are unable to keep an appointment. What may interact with this medicine?  medicines to increase blood counts like filgrastim, pegfilgrastim, sargramostim  probenecid  some antibiotics like amikacin, gentamicin, neomycin, polymyxin B, streptomycin, tobramycin  zalcitabine Talk to your doctor or health care professional before taking any of these medicines:  acetaminophen  aspirin  ibuprofen  ketoprofen  naproxen This list may not describe all possible interactions. Give your health care provider a list of all the medicines, herbs, non-prescription drugs, or dietary supplements you use. Also tell them if you smoke, drink alcohol, or use illegal drugs. Some items may interact with your medicine. What should I watch for while using this medicine? Your condition will be monitored carefully while you are receiving this medicine. You will need important blood work done while you are taking this medicine. This medicine can make you more sensitive to cold. Do not drink  cold drinks or use ice. Cover exposed skin before coming in contact with cold temperatures or cold objects. When out in cold weather wear warm clothing and cover your mouth and nose to warm the air  that goes into your lungs. Tell your doctor if you get sensitive to the cold. This drug may make you feel generally unwell. This is not uncommon, as chemotherapy can affect healthy cells as well as cancer cells. Report any side effects. Continue your course of treatment even though you feel ill unless your doctor tells you to stop. In some cases, you may be given additional medicines to help with side effects. Follow all directions for their use. Call your doctor or health care professional for advice if you get a fever, chills or sore throat, or other symptoms of a cold or flu. Do not treat yourself. This drug decreases your body's ability to fight infections. Try to avoid being around people who are sick. This medicine may increase your risk to bruise or bleed. Call your doctor or health care professional if you notice any unusual bleeding. Be careful brushing and flossing your teeth or using a toothpick because you may get an infection or bleed more easily. If you have any dental work done, tell your dentist you are receiving this medicine. Avoid taking products that contain aspirin, acetaminophen, ibuprofen, naproxen, or ketoprofen unless instructed by your doctor. These medicines may hide a fever. Do not become pregnant while taking this medicine. Women should inform their doctor if they wish to become pregnant or think they might be pregnant. There is a potential for serious side effects to an unborn child. Talk to your health care professional or pharmacist for more information. Do not breast-feed an infant while taking this medicine. Call your doctor or health care professional if you get diarrhea. Do not treat yourself. What side effects may I notice from receiving this medicine? Side effects that you should report to your doctor or health care professional as soon as possible:  allergic reactions like skin rash, itching or hives, swelling of the face, lips, or tongue  low blood counts - This  drug may decrease the number of white blood cells, red blood cells and platelets. You may be at increased risk for infections and bleeding.  signs of infection - fever or chills, cough, sore throat, pain or difficulty passing urine  signs of decreased platelets or bleeding - bruising, pinpoint red spots on the skin, black, tarry stools, nosebleeds  signs of decreased red blood cells - unusually weak or tired, fainting spells, lightheadedness  breathing problems  chest pain, pressure  cough  diarrhea  jaw tightness  mouth sores  nausea and vomiting  pain, swelling, redness or irritation at the injection site  pain, tingling, numbness in the hands or feet  problems with balance, talking, walking  redness, blistering, peeling or loosening of the skin, including inside the mouth  trouble passing urine or change in the amount of urine Side effects that usually do not require medical attention (report to your doctor or health care professional if they continue or are bothersome):  changes in vision  constipation  hair loss  loss of appetite  metallic taste in the mouth or changes in taste  stomach pain This list may not describe all possible side effects. Call your doctor for medical advice about side effects. You may report side effects to FDA at 1-800-FDA-1088. Where should I keep my medicine? This drug is given in a hospital  or clinic and will not be stored at home. NOTE: This sheet is a summary. It may not cover all possible information. If you have questions about this medicine, talk to your doctor, pharmacist, or health care provider.  2020 Elsevier/Gold Standard (2007-10-12 17:22:47)  Leucovorin injection What is this medicine? LEUCOVORIN (loo koe VOR in) is used to prevent or treat the harmful effects of some medicines. This medicine is used to treat anemia caused by a low amount of folic acid in the body. It is also used with 5-fluorouracil (5-FU) to treat  colon cancer. This medicine may be used for other purposes; ask your health care provider or pharmacist if you have questions. What should I tell my health care provider before I take this medicine? They need to know if you have any of these conditions:  anemia from low levels of vitamin B-12 in the blood  an unusual or allergic reaction to leucovorin, folic acid, other medicines, foods, dyes, or preservatives  pregnant or trying to get pregnant  breast-feeding How should I use this medicine? This medicine is for injection into a muscle or into a vein. It is given by a health care professional in a hospital or clinic setting. Talk to your pediatrician regarding the use of this medicine in children. Special care may be needed. Overdosage: If you think you have taken too much of this medicine contact a poison control center or emergency room at once. NOTE: This medicine is only for you. Do not share this medicine with others. What if I miss a dose? This does not apply. What may interact with this medicine?  capecitabine  fluorouracil  phenobarbital  phenytoin  primidone  trimethoprim-sulfamethoxazole This list may not describe all possible interactions. Give your health care provider a list of all the medicines, herbs, non-prescription drugs, or dietary supplements you use. Also tell them if you smoke, drink alcohol, or use illegal drugs. Some items may interact with your medicine. What should I watch for while using this medicine? Your condition will be monitored carefully while you are receiving this medicine. This medicine may increase the side effects of 5-fluorouracil, 5-FU. Tell your doctor or health care professional if you have diarrhea or mouth sores that do not get better or that get worse. What side effects may I notice from receiving this medicine? Side effects that you should report to your doctor or health care professional as soon as possible:  allergic reactions  like skin rash, itching or hives, swelling of the face, lips, or tongue  breathing problems  fever, infection  mouth sores  unusual bleeding or bruising  unusually weak or tired Side effects that usually do not require medical attention (report to your doctor or health care professional if they continue or are bothersome):  constipation or diarrhea  loss of appetite  nausea, vomiting This list may not describe all possible side effects. Call your doctor for medical advice about side effects. You may report side effects to FDA at 1-800-FDA-1088. Where should I keep my medicine? This drug is given in a hospital or clinic and will not be stored at home. NOTE: This sheet is a summary. It may not cover all possible information. If you have questions about this medicine, talk to your doctor, pharmacist, or health care provider.  2020 Elsevier/Gold Standard (2007-09-21 16:50:29)  Fluorouracil, 5-FU injection What is this medicine? FLUOROURACIL, 5-FU (flure oh YOOR a sil) is a chemotherapy drug. It slows the growth of cancer cells. This medicine is  used to treat many types of cancer like breast cancer, colon or rectal cancer, pancreatic cancer, and stomach cancer. This medicine may be used for other purposes; ask your health care provider or pharmacist if you have questions. COMMON BRAND NAME(S): Adrucil What should I tell my health care provider before I take this medicine? They need to know if you have any of these conditions:  blood disorders  dihydropyrimidine dehydrogenase (DPD) deficiency  infection (especially a virus infection such as chickenpox, cold sores, or herpes)  kidney disease  liver disease  malnourished, poor nutrition  recent or ongoing radiation therapy  an unusual or allergic reaction to fluorouracil, other chemotherapy, other medicines, foods, dyes, or preservatives  pregnant or trying to get pregnant  breast-feeding How should I use this  medicine? This drug is given as an infusion or injection into a vein. It is administered in a hospital or clinic by a specially trained health care professional. Talk to your pediatrician regarding the use of this medicine in children. Special care may be needed. Overdosage: If you think you have taken too much of this medicine contact a poison control center or emergency room at once. NOTE: This medicine is only for you. Do not share this medicine with others. What if I miss a dose? It is important not to miss your dose. Call your doctor or health care professional if you are unable to keep an appointment. What may interact with this medicine?  allopurinol  cimetidine  dapsone  digoxin  hydroxyurea  leucovorin  levamisole  medicines for seizures like ethotoin, fosphenytoin, phenytoin  medicines to increase blood counts like filgrastim, pegfilgrastim, sargramostim  medicines that treat or prevent blood clots like warfarin, enoxaparin, and dalteparin  methotrexate  metronidazole  pyrimethamine  some other chemotherapy drugs like busulfan, cisplatin, estramustine, vinblastine  trimethoprim  trimetrexate  vaccines Talk to your doctor or health care professional before taking any of these medicines:  acetaminophen  aspirin  ibuprofen  ketoprofen  naproxen This list may not describe all possible interactions. Give your health care provider a list of all the medicines, herbs, non-prescription drugs, or dietary supplements you use. Also tell them if you smoke, drink alcohol, or use illegal drugs. Some items may interact with your medicine. What should I watch for while using this medicine? Visit your doctor for checks on your progress. This drug may make you feel generally unwell. This is not uncommon, as chemotherapy can affect healthy cells as well as cancer cells. Report any side effects. Continue your course of treatment even though you feel ill unless your doctor  tells you to stop. In some cases, you may be given additional medicines to help with side effects. Follow all directions for their use. Call your doctor or health care professional for advice if you get a fever, chills or sore throat, or other symptoms of a cold or flu. Do not treat yourself. This drug decreases your body's ability to fight infections. Try to avoid being around people who are sick. This medicine may increase your risk to bruise or bleed. Call your doctor or health care professional if you notice any unusual bleeding. Be careful brushing and flossing your teeth or using a toothpick because you may get an infection or bleed more easily. If you have any dental work done, tell your dentist you are receiving this medicine. Avoid taking products that contain aspirin, acetaminophen, ibuprofen, naproxen, or ketoprofen unless instructed by your doctor. These medicines may hide a fever. Do not  become pregnant while taking this medicine. Women should inform their doctor if they wish to become pregnant or think they might be pregnant. There is a potential for serious side effects to an unborn child. Talk to your health care professional or pharmacist for more information. Do not breast-feed an infant while taking this medicine. Men should inform their doctor if they wish to father a child. This medicine may lower sperm counts. Do not treat diarrhea with over the counter products. Contact your doctor if you have diarrhea that lasts more than 2 days or if it is severe and watery. This medicine can make you more sensitive to the sun. Keep out of the sun. If you cannot avoid being in the sun, wear protective clothing and use sunscreen. Do not use sun lamps or tanning beds/booths. What side effects may I notice from receiving this medicine? Side effects that you should report to your doctor or health care professional as soon as possible:  allergic reactions like skin rash, itching or hives, swelling of  the face, lips, or tongue  low blood counts - this medicine may decrease the number of white blood cells, red blood cells and platelets. You may be at increased risk for infections and bleeding.  signs of infection - fever or chills, cough, sore throat, pain or difficulty passing urine  signs of decreased platelets or bleeding - bruising, pinpoint red spots on the skin, black, tarry stools, blood in the urine  signs of decreased red blood cells - unusually weak or tired, fainting spells, lightheadedness  breathing problems  changes in vision  chest pain  mouth sores  nausea and vomiting  pain, swelling, redness at site where injected  pain, tingling, numbness in the hands or feet  redness, swelling, or sores on hands or feet  stomach pain  unusual bleeding Side effects that usually do not require medical attention (report to your doctor or health care professional if they continue or are bothersome):  changes in finger or toe nails  diarrhea  dry or itchy skin  hair loss  headache  loss of appetite  sensitivity of eyes to the light  stomach upset  unusually teary eyes This list may not describe all possible side effects. Call your doctor for medical advice about side effects. You may report side effects to FDA at 1-800-FDA-1088. Where should I keep my medicine? This drug is given in a hospital or clinic and will not be stored at home. NOTE: This sheet is a summary. It may not cover all possible information. If you have questions about this medicine, talk to your doctor, pharmacist, or health care provider.  2020 Elsevier/Gold Standard (2007-07-21 13:53:16)  Coronavirus (COVID-19) Are you at risk?  Are you at risk for the Coronavirus (COVID-19)?  To be considered HIGH RISK for Coronavirus (COVID-19), you have to meet the following criteria:  . Traveled to [place], [place], [place], [place] or [place]; or in the [place] to [place], [place], [place], or [place]; and have fever, cough, and shortness of breath within the last 2 weeks of travel OR . Been in close contact with a person diagnosed with COVID-19 within the last 2 weeks and have fever, cough, and shortness of breath . IF YOU DO NOT MEET THESE CRITERIA, YOU ARE CONSIDERED LOW RISK FOR COVID-19.  What to do if you are HIGH RISK for COVID-19?  Marland Kitchen If you are having a medical emergency, call 911. . Seek medical care right away.  Before you go to a doctor's office, urgent care or emergency department, call ahead and tell them about your recent travel, contact with someone diagnosed with COVID-19, and your symptoms. You should receive instructions from your physician's office regarding next steps of care.  . When you arrive at healthcare provider, tell the healthcare staff immediately you have returned from visiting [place], [place], [place], [place] or [place]; or traveled in the [place] to [place], [place], [place], or [place]; in the last two weeks or you have been in close contact with a person diagnosed with COVID-19 in the last 2 weeks.   . Tell the health care staff about your symptoms: fever, cough and shortness of breath. . After you have been seen by a medical provider, you will be either: o Tested for (COVID-19) and discharged home on quarantine except to seek medical care if symptoms worsen, and asked to  - Stay home and avoid contact with others until you get your results (4-5 days)  - Avoid travel on public transportation if possible (such as bus, train, or airplane) or o Sent to the Emergency Department by EMS for evaluation, COVID-19 testing, and possible admission depending on your condition and test results.  What to do if you are LOW RISK for COVID-19?  Reduce your risk of any infection by using the same precautions used for avoiding the common cold or flu:  Marland Kitchen Wash your hands often with soap and warm water for at least 20 seconds.  If soap and water are  not readily available, use an alcohol-based hand sanitizer with at least 60% alcohol.  . If coughing or sneezing, cover your mouth and nose by coughing or sneezing into the elbow areas of your shirt or coat, into a tissue or into your sleeve (not your hands). . Avoid shaking hands with others and consider head nods or verbal greetings only. . Avoid touching your eyes, nose, or mouth with unwashed hands.  . Avoid close contact with people who are sick. . Avoid places or events with large numbers of people in one location, like concerts or sporting events. . Carefully consider travel plans you have or are making. . If you are planning any travel outside or inside the [place], visit the CDC's Travelers' Health webpage for the latest health notices. . If you have some symptoms but not all symptoms, continue to monitor at home and seek medical attention if your symptoms worsen. . If you are having a medical emergency, call 911.   [facility] / e-Visit: [email or web address]         [facility]: [facility]: [id number]                   [facility]: [phone]

## 2019-05-16 NOTE — Progress Notes (Signed)
Per Dr. Benay Spice: OK to treat w/platelet count 81,000

## 2019-05-16 NOTE — Patient Instructions (Signed)

## 2019-05-18 ENCOUNTER — Inpatient Hospital Stay: Payer: BC Managed Care – PPO

## 2019-05-18 ENCOUNTER — Other Ambulatory Visit: Payer: Self-pay

## 2019-05-18 ENCOUNTER — Telehealth: Payer: Self-pay | Admitting: Oncology

## 2019-05-18 DIAGNOSIS — Z5111 Encounter for antineoplastic chemotherapy: Secondary | ICD-10-CM | POA: Diagnosis not present

## 2019-05-18 DIAGNOSIS — Z95828 Presence of other vascular implants and grafts: Secondary | ICD-10-CM

## 2019-05-18 DIAGNOSIS — C184 Malignant neoplasm of transverse colon: Secondary | ICD-10-CM | POA: Diagnosis not present

## 2019-05-18 MED ORDER — SODIUM CHLORIDE 0.9% FLUSH
10.0000 mL | INTRAVENOUS | Status: DC | PRN
  Administered 2019-05-18: 10 mL via INTRAVENOUS
  Filled 2019-05-18: qty 10

## 2019-05-18 MED ORDER — HEPARIN SOD (PORK) LOCK FLUSH 100 UNIT/ML IV SOLN
500.0000 [IU] | Freq: Once | INTRAVENOUS | Status: AC
  Administered 2019-05-18: 500 [IU] via INTRAVENOUS
  Filled 2019-05-18: qty 5

## 2019-05-18 NOTE — Telephone Encounter (Signed)
Scheduled per los. Called and left msg. Mailed printout  °

## 2019-05-18 NOTE — Patient Instructions (Signed)

## 2019-05-22 DIAGNOSIS — C186 Malignant neoplasm of descending colon: Secondary | ICD-10-CM | POA: Diagnosis not present

## 2019-05-28 ENCOUNTER — Other Ambulatory Visit: Payer: Self-pay | Admitting: Oncology

## 2019-05-30 ENCOUNTER — Inpatient Hospital Stay: Payer: BC Managed Care – PPO

## 2019-05-30 ENCOUNTER — Inpatient Hospital Stay: Payer: BC Managed Care – PPO | Attending: Genetic Counselor | Admitting: Nurse Practitioner

## 2019-05-30 ENCOUNTER — Encounter: Payer: Self-pay | Admitting: Nurse Practitioner

## 2019-05-30 ENCOUNTER — Other Ambulatory Visit: Payer: Self-pay

## 2019-05-30 VITALS — BP 132/92 | HR 81 | Temp 98.0°F | Resp 18 | Ht 72.0 in | Wt 197.6 lb

## 2019-05-30 DIAGNOSIS — C186 Malignant neoplasm of descending colon: Secondary | ICD-10-CM | POA: Diagnosis not present

## 2019-05-30 DIAGNOSIS — Z5111 Encounter for antineoplastic chemotherapy: Secondary | ICD-10-CM | POA: Diagnosis not present

## 2019-05-30 DIAGNOSIS — D6959 Other secondary thrombocytopenia: Secondary | ICD-10-CM | POA: Diagnosis not present

## 2019-05-30 DIAGNOSIS — C184 Malignant neoplasm of transverse colon: Secondary | ICD-10-CM | POA: Diagnosis not present

## 2019-05-30 LAB — CMP (CANCER CENTER ONLY)
ALT: 46 U/L — ABNORMAL HIGH (ref 0–44)
AST: 34 U/L (ref 15–41)
Albumin: 3.3 g/dL — ABNORMAL LOW (ref 3.5–5.0)
Alkaline Phosphatase: 54 U/L (ref 38–126)
Anion gap: 8 (ref 5–15)
BUN: 15 mg/dL (ref 6–20)
CO2: 27 mmol/L (ref 22–32)
Calcium: 9.1 mg/dL (ref 8.9–10.3)
Chloride: 104 mmol/L (ref 98–111)
Creatinine: 0.98 mg/dL (ref 0.61–1.24)
GFR, Est AFR Am: >60 mL/min (ref >60)
GFR, Estimated: >60 mL/min (ref >60)
Glucose, Bld: 252 mg/dL — ABNORMAL HIGH (ref 70–99)
Potassium: 4.3 mmol/L (ref 3.5–5.1)
Sodium: 139 mmol/L (ref 135–145)
Total Bilirubin: 1.1 mg/dL (ref 0.3–1.2)
Total Protein: 6.4 g/dL — ABNORMAL LOW (ref 6.5–8.1)

## 2019-05-30 LAB — CBC WITH DIFFERENTIAL (CANCER CENTER ONLY)
Abs Immature Granulocytes: 0.01 10*3/uL (ref 0.00–0.07)
Basophils Absolute: 0 10*3/uL (ref 0.0–0.1)
Basophils Relative: 1 %
Eosinophils Absolute: 0.1 10*3/uL (ref 0.0–0.5)
Eosinophils Relative: 3 %
HCT: 40.4 % (ref 39.0–52.0)
Hemoglobin: 14.2 g/dL (ref 13.0–17.0)
Immature Granulocytes: 0 %
Lymphocytes Relative: 19 %
Lymphs Abs: 1 10*3/uL (ref 0.7–4.0)
MCH: 33.3 pg (ref 26.0–34.0)
MCHC: 35.1 g/dL (ref 30.0–36.0)
MCV: 94.6 fL (ref 80.0–100.0)
Monocytes Absolute: 0.6 10*3/uL (ref 0.1–1.0)
Monocytes Relative: 11 %
Neutro Abs: 3.4 10*3/uL (ref 1.7–7.7)
Neutrophils Relative %: 66 %
Platelet Count: 63 10*3/uL — ABNORMAL LOW (ref 150–400)
RBC: 4.27 MIL/uL (ref 4.22–5.81)
RDW: 15.3 % (ref 11.5–15.5)
WBC Count: 5.2 10*3/uL (ref 4.0–10.5)
nRBC: 0 % (ref 0.0–0.2)

## 2019-05-30 MED ORDER — SODIUM CHLORIDE 0.9 % IV SOLN
400.0000 mg/m2 | Freq: Once | INTRAVENOUS | Status: AC
  Administered 2019-05-30: 836 mg via INTRAVENOUS
  Filled 2019-05-30: qty 41.8

## 2019-05-30 MED ORDER — SODIUM CHLORIDE 0.9 % IV SOLN
INTRAVENOUS | Status: DC
  Filled 2019-05-30: qty 250

## 2019-05-30 MED ORDER — FLUOROURACIL CHEMO INJECTION 2.5 GM/50ML
400.0000 mg/m2 | Freq: Once | INTRAVENOUS | Status: AC
  Administered 2019-05-30: 850 mg via INTRAVENOUS
  Filled 2019-05-30: qty 17

## 2019-05-30 MED ORDER — SODIUM CHLORIDE 0.9 % IV SOLN
2400.0000 mg/m2 | INTRAVENOUS | Status: DC
  Administered 2019-05-30: 5000 mg via INTRAVENOUS
  Filled 2019-05-30: qty 100

## 2019-05-30 NOTE — Progress Notes (Signed)
Spoke w/ Lattie Haw - okay to proceed with low platelet count today - oxaliplatin will be held today. Patient will receive fluorouracil/leucovorin only. Premedications removed from today's treatment as well since no oxaliplatin. Leucovorin to run over 30 minutes today since no oxaliplatin.   Demetrius Charity, PharmD, [facility] Phone: [phone] 05/30/2019

## 2019-05-30 NOTE — Progress Notes (Addendum)
[place] OFFICE PROGRESS NOTE   Diagnosis: Colon cancer  INTERVAL HISTORY:   [name] returns as scheduled.  He completed cycle 5 FOLFOX 05/16/2019.  He had mild nausea for a few days.  No mouth sores.  No diarrhea.  No hand or foot pain or redness.  Cold sensitivity lasted about 1 day.  No persistent neuropathy symptoms.  He notes mild bleeding with nose blowing.  No other bleeding.  Objective:  Vital signs in last 24 hours:  Blood pressure (!) 132/92, pulse 81, temperature 98 F (36.7 C), temperature source Temporal, resp. rate 18, height 6' (1.829 m), weight 197 lb 9.6 oz (89.6 kg), SpO2 100 %.    HEENT: No thrush or ulcers. GI: Abdomen soft and nontender.  No hepatomegaly. Vascular: No leg edema. Neuro: Vibratory sense intact over the fingertips per tuning fork exam. Skin: Palms with mild erythema, dryness. Port-A-Cath without erythema.   Lab Results:  Lab Results  Component Value Date   WBC 5.2 05/30/2019   HGB 14.2 05/30/2019   HCT 40.4 05/30/2019   MCV 94.6 05/30/2019   PLT 63 (L) 05/30/2019   NEUTROABS 3.4 05/30/2019    Imaging:  No results found.  Medications: I have reviewed the patient's current medications.  Assessment/Plan: 1. 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 Mass felt to be in the transverse colon on a colonoscopy 01/07/2019 with a biopsy confirming poorly differentiated adenocarcinoma with signet ring cell features, mismatch repair protein expression normal ? CT abdomen/pelvis 01/08/2019 12.3 cm indeterminate splenic mass, present in 2015 on a lumbar MRI-felt to be benign, small subpleural and pleural-based nodules at the lung bases, status post left nephrectomy ? CT chest 01/20/2019-small bilateral pulmonary nodules with rounded parenchymal nodules in the right lower lobe, indeterminate  splenic mass ? CT chest 03/18/2019-multiple subcentimeter pulmonary nodules again seen bilaterally, greatest in the lower lobes, without significant change.  No new or enlarging pulmonary nodules or masses.  Partially visualized large heterogeneous splenic mass with no significant change.  Plan for follow-up chest CT at a 84-monthinterval. ? Cycle 1 FOLFOX 03/21/2019 ? Cycle 2 FOLFOX 04/04/2019 ? Cycle 3 FOLFOX 04/18/2019 (oxaliplatin dose reduced secondary to thrombocytopenia) ? Cycle 4 FOLFOX 05/02/2019 (oxaliplatin held secondary to thrombocytopenia) ? Cycle 5 FOLFOX 05/16/2019 ? Cycle 6 FOLFOX 05/30/2019 (oxaliplatin held secondary to thrombocytopenia)  2. Multiple colon polyps-ascending colon polyps on the colonoscopy 01/07/2019-not removed 3. Wilms tumor at age 54 status [place] chemotherapy[place]radiation [place]. 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 Lumbar discitis 2015 7. Diabetes 8. Asthma 9. Multiple lipomas 10. Family history of colon cancer  Invitaepanel 2020-POLE VUS 11.Left nephrectomy at age 54 12  Port-A-Cath placement, Dr. GJohney Maine 03/16/2019 13.  Thrombocytopenia secondary to chemotherapy-oxaliplatin dose reduced beginning with cycle 3 FOLFOX   Disposition: Mr. JKaczmarczykappears stable.  He has completed 5 cycles of FOLFOX.  Plan to proceed with cycle 6 today as scheduled.  Oxaliplatin will be held due to thrombocytopenia.  We reviewed the CBC from today.  He has moderate thrombocytopenia.  He understands to contact the office with any bleeding.  As above oxaliplatin will be held with today's treatment.  He will undergo a follow-up chest CT prior to his next visit to reevaluate lung nodules.  He will return for lab, follow-up, FOLFOX in 2 weeks.  He will contact the office in the interim as outlined above or with any other problems.  Patient seen with Dr. Benay Spice.    Ned Card ANP/GNP-BC   05/30/2019  10:42 AM This was a shared visit with Ned Card.  The platelets are lower today.  Oxaliplatin will be held.  He requests PSA screening for prostate cancer.  Julieanne Manson, MD

## 2019-05-30 NOTE — Progress Notes (Signed)
Per Ned Card, NP, pt will not receive Oxaliplatin due to low platelets.

## 2019-05-30 NOTE — Patient Instructions (Signed)
[facility] Discharge Instructions for Patients Receiving Chemotherapy  Today you received the following chemotherapy agents: leucovorin and fluorouracil.  To help prevent nausea and vomiting after your treatment, we encourage you to take your nausea medication as directed.   If you develop nausea and vomiting that is not controlled by your nausea medication, call the clinic.   BELOW ARE SYMPTOMS THAT SHOULD BE REPORTED IMMEDIATELY:  *FEVER GREATER THAN 100.5 F  *CHILLS WITH OR WITHOUT FEVER  NAUSEA AND VOMITING THAT IS NOT CONTROLLED WITH YOUR NAUSEA MEDICATION  *UNUSUAL SHORTNESS OF BREATH  *UNUSUAL BRUISING OR BLEEDING  TENDERNESS IN MOUTH AND THROAT WITH OR WITHOUT PRESENCE OF ULCERS  *URINARY PROBLEMS  *BOWEL PROBLEMS  UNUSUAL RASH Items with * indicate a potential emergency and should be followed up as soon as possible.  Feel free to call the clinic should you have any questions or concerns. The clinic phone number is [phone].  Please show the CHEMO ALERT CARD at check-in to the Emergency Department and triage nurse.   

## 2019-05-31 ENCOUNTER — Telehealth: Payer: Self-pay | Admitting: Oncology

## 2019-05-31 NOTE — Telephone Encounter (Signed)
Scheduled per 3/1 los. Called and left msg about added appt. Mailed printout  °

## 2019-06-01 ENCOUNTER — Inpatient Hospital Stay: Payer: BC Managed Care – PPO

## 2019-06-01 ENCOUNTER — Other Ambulatory Visit: Payer: Self-pay

## 2019-06-01 VITALS — BP 154/95 | HR 79 | Temp 98.3°F | Resp 18

## 2019-06-01 DIAGNOSIS — C186 Malignant neoplasm of descending colon: Secondary | ICD-10-CM | POA: Diagnosis not present

## 2019-06-01 DIAGNOSIS — Z5111 Encounter for antineoplastic chemotherapy: Secondary | ICD-10-CM | POA: Diagnosis not present

## 2019-06-01 DIAGNOSIS — D6959 Other secondary thrombocytopenia: Secondary | ICD-10-CM | POA: Diagnosis not present

## 2019-06-01 DIAGNOSIS — C184 Malignant neoplasm of transverse colon: Secondary | ICD-10-CM | POA: Diagnosis not present

## 2019-06-01 MED ORDER — SODIUM CHLORIDE 0.9% FLUSH
10.0000 mL | INTRAVENOUS | Status: DC | PRN
  Administered 2019-06-01: 10 mL
  Filled 2019-06-01: qty 10

## 2019-06-01 MED ORDER — HEPARIN SOD (PORK) LOCK FLUSH 100 UNIT/ML IV SOLN
500.0000 [IU] | Freq: Once | INTRAVENOUS | Status: AC | PRN
  Administered 2019-06-01: 500 [IU]
  Filled 2019-06-01: qty 5

## 2019-06-01 NOTE — Patient Instructions (Signed)

## 2019-06-08 ENCOUNTER — Ambulatory Visit (HOSPITAL_COMMUNITY)
Admission: RE | Admit: 2019-06-08 | Discharge: 2019-06-08 | Disposition: A | Discharge status: Home / Self Care | Payer: BC Managed Care – PPO | Source: Ambulatory Visit | Attending: Nurse Practitioner | Admitting: Nurse Practitioner

## 2019-06-08 ENCOUNTER — Other Ambulatory Visit: Payer: Self-pay

## 2019-06-08 ENCOUNTER — Encounter (HOSPITAL_COMMUNITY): Payer: Self-pay

## 2019-06-08 DIAGNOSIS — C186 Malignant neoplasm of descending colon: Secondary | ICD-10-CM | POA: Insufficient documentation

## 2019-06-08 DIAGNOSIS — R918 Other nonspecific abnormal finding of lung field: Secondary | ICD-10-CM | POA: Diagnosis not present

## 2019-06-10 ENCOUNTER — Telehealth: Payer: Self-pay | Admitting: *Deleted

## 2019-06-10 NOTE — Telephone Encounter (Signed)
LM with note below 

## 2019-06-10 NOTE — Telephone Encounter (Signed)
-----   Message from Ladell Pier, MD sent at 06/10/2019  7:00 AM EST ----- Please call patient, stable lung nodules, consistent with benign lesions, f/u as scheduled

## 2019-06-12 ENCOUNTER — Other Ambulatory Visit: Payer: Self-pay | Admitting: Oncology

## 2019-06-13 ENCOUNTER — Inpatient Hospital Stay: Payer: BC Managed Care – PPO

## 2019-06-13 ENCOUNTER — Inpatient Hospital Stay: Payer: BC Managed Care – PPO | Admitting: Nurse Practitioner

## 2019-06-13 ENCOUNTER — Other Ambulatory Visit: Payer: Self-pay

## 2019-06-13 ENCOUNTER — Encounter: Payer: Self-pay | Admitting: Nurse Practitioner

## 2019-06-13 VITALS — BP 124/89 | HR 84 | Temp 98.2°F | Resp 17 | Wt 195.9 lb

## 2019-06-13 DIAGNOSIS — C184 Malignant neoplasm of transverse colon: Secondary | ICD-10-CM | POA: Diagnosis not present

## 2019-06-13 DIAGNOSIS — D6959 Other secondary thrombocytopenia: Secondary | ICD-10-CM | POA: Diagnosis not present

## 2019-06-13 DIAGNOSIS — C186 Malignant neoplasm of descending colon: Secondary | ICD-10-CM | POA: Diagnosis not present

## 2019-06-13 DIAGNOSIS — Z5111 Encounter for antineoplastic chemotherapy: Secondary | ICD-10-CM | POA: Diagnosis not present

## 2019-06-13 LAB — CMP (CANCER CENTER ONLY)
ALT: 36 U/L (ref 0–44)
AST: 29 U/L (ref 15–41)
Albumin: 3.5 g/dL (ref 3.5–5.0)
Alkaline Phosphatase: 57 U/L (ref 38–126)
Anion gap: 9 (ref 5–15)
BUN: 15 mg/dL (ref 6–20)
CO2: 26 mmol/L (ref 22–32)
Calcium: 9.1 mg/dL (ref 8.9–10.3)
Chloride: 105 mmol/L (ref 98–111)
Creatinine: 1.07 mg/dL (ref 0.61–1.24)
GFR, Est AFR Am: >60 mL/min (ref >60)
GFR, Estimated: >60 mL/min (ref >60)
Glucose, Bld: 206 mg/dL — ABNORMAL HIGH (ref 70–99)
Potassium: 4.4 mmol/L (ref 3.5–5.1)
Sodium: 140 mmol/L (ref 135–145)
Total Bilirubin: 1 mg/dL (ref 0.3–1.2)
Total Protein: 6.7 g/dL (ref 6.5–8.1)

## 2019-06-13 LAB — CBC WITH DIFFERENTIAL (CANCER CENTER ONLY)
Abs Immature Granulocytes: 0.01 10*3/uL (ref 0.00–0.07)
Basophils Absolute: 0.1 10*3/uL (ref 0.0–0.1)
Basophils Relative: 1 %
Eosinophils Absolute: 0.2 10*3/uL (ref 0.0–0.5)
Eosinophils Relative: 5 %
HCT: 42.4 % (ref 39.0–52.0)
Hemoglobin: 14.8 g/dL (ref 13.0–17.0)
Immature Granulocytes: 0 %
Lymphocytes Relative: 24 %
Lymphs Abs: 0.9 10*3/uL (ref 0.7–4.0)
MCH: 33.8 pg (ref 26.0–34.0)
MCHC: 34.9 g/dL (ref 30.0–36.0)
MCV: 96.8 fL (ref 80.0–100.0)
Monocytes Absolute: 0.5 10*3/uL (ref 0.1–1.0)
Monocytes Relative: 14 %
Neutro Abs: 1.9 10*3/uL (ref 1.7–7.7)
Neutrophils Relative %: 56 %
Platelet Count: 88 10*3/uL — ABNORMAL LOW (ref 150–400)
RBC: 4.38 MIL/uL (ref 4.22–5.81)
RDW: 14.4 % (ref 11.5–15.5)
WBC Count: 3.5 10*3/uL — ABNORMAL LOW (ref 4.0–10.5)
nRBC: 0 % (ref 0.0–0.2)

## 2019-06-13 MED ORDER — DEXAMETHASONE SODIUM PHOSPHATE 10 MG/ML IJ SOLN
INTRAMUSCULAR | Status: AC
  Filled 2019-06-13: qty 1

## 2019-06-13 MED ORDER — FLUOROURACIL CHEMO INJECTION 2.5 GM/50ML
400.0000 mg/m2 | Freq: Once | INTRAVENOUS | Status: AC
  Administered 2019-06-13: 850 mg via INTRAVENOUS
  Filled 2019-06-13: qty 17

## 2019-06-13 MED ORDER — DEXAMETHASONE SODIUM PHOSPHATE 10 MG/ML IJ SOLN
10.0000 mg | Freq: Once | INTRAMUSCULAR | Status: AC
  Administered 2019-06-13: 10 mg via INTRAVENOUS

## 2019-06-13 MED ORDER — LEUCOVORIN CALCIUM INJECTION 350 MG
400.0000 mg/m2 | Freq: Once | INTRAVENOUS | Status: AC
  Administered 2019-06-13: 836 mg via INTRAVENOUS
  Filled 2019-06-13: qty 41.8

## 2019-06-13 MED ORDER — OXALIPLATIN CHEMO INJECTION 100 MG/20ML
65.0000 mg/m2 | Freq: Once | INTRAVENOUS | Status: AC
  Administered 2019-06-13: 135 mg via INTRAVENOUS
  Filled 2019-06-13: qty 20

## 2019-06-13 MED ORDER — PALONOSETRON HCL INJECTION 0.25 MG/5ML
INTRAVENOUS | Status: AC
  Filled 2019-06-13: qty 5

## 2019-06-13 MED ORDER — SODIUM CHLORIDE 0.9 % IV SOLN
2400.0000 mg/m2 | INTRAVENOUS | Status: DC
  Administered 2019-06-13: 5000 mg via INTRAVENOUS
  Filled 2019-06-13: qty 100

## 2019-06-13 MED ORDER — DEXTROSE 5 % IV SOLN
Freq: Once | INTRAVENOUS | Status: AC
  Filled 2019-06-13: qty 250

## 2019-06-13 MED ORDER — PALONOSETRON HCL INJECTION 0.25 MG/5ML
0.2500 mg | Freq: Once | INTRAVENOUS | Status: AC
  Administered 2019-06-13: 0.25 mg via INTRAVENOUS

## 2019-06-13 NOTE — Progress Notes (Signed)
  [place] OFFICE PROGRESS NOTE   Diagnosis: Colon cancer  INTERVAL HISTORY:   [name] returns as scheduled.  He completed cycle 6 FOLFOX on 05/30/2019.  Oxaliplatin was held due to thrombocytopenia.  He denies nausea/vomiting.  No mouth sores.  No diarrhea.  No numbness or tingling in the hands or feet.  Objective:  Vital signs in last 24 hours:  Blood pressure 124/89, pulse 84, temperature 98.2 F (36.8 C), temperature source Temporal, resp. rate 17, weight 195 lb 14.4 oz (88.9 kg), SpO2 100 %.    HEENT: No thrush or ulcers. GI: Abdomen soft and nontender.  No hepatomegaly. Vascular: No leg edema. Neuro: Vibratory sense mildly decreased over the fingertips per tuning fork exam. Skin: Palms dry appearing, mild erythema. Port-A-Cath without erythema.   Lab Results:  Lab Results  Component Value Date   WBC 3.5 (L) 06/13/2019   HGB 14.8 06/13/2019   HCT 42.4 06/13/2019   MCV 96.8 06/13/2019   PLT 88 (L) 06/13/2019   NEUTROABS 1.9 06/13/2019    Imaging:  No results found.  Medications: I have reviewed the patient's current medications.  Assessment/Plan: 1. 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 Mass felt to be in the transverse colon on a colonoscopy 01/07/2019 with a biopsy confirming poorly differentiated adenocarcinoma with signet ring cell features, mismatch repair protein expression normal ? CT abdomen/pelvis 01/08/2019 12.3 cm indeterminate splenic mass, present in 2015 on a lumbar MRI-felt to be benign, small subpleural and pleural-based nodules at the lung bases, status post left nephrectomy ? CT chest 01/20/2019-small bilateral pulmonary nodules with rounded parenchymal nodules in the right lower lobe, indeterminate splenic mass ? CT chest 03/18/2019-multiple subcentimeter pulmonary nodules again seen  bilaterally, greatest in the lower lobes, without significant change. No new or enlarging pulmonary nodules or masses. Partially visualized large heterogeneous splenic mass with no significant change. Plan for follow-up chest CT at a 73-monthinterval. ? Cycle 1 FOLFOX 03/21/2019 ? Cycle 2 FOLFOX 04/04/2019 ? Cycle 3 FOLFOX 04/18/2019 (oxaliplatin dose reduced secondary to thrombocytopenia) ? Cycle 4 FOLFOX 05/02/2019 (oxaliplatin held secondary to thrombocytopenia) ? Cycle 5 FOLFOX 05/16/2019 ? Cycle 6 FOLFOX 05/30/2019 (oxaliplatin held secondary to thrombocytopenia) ? CT chest 06/08/2019-stable scattered small solid pulmonary nodules  2. Multiple colon polyps-ascending colon polyps on the colonoscopy 01/07/2019-not removed 3. Wilms tumor at age 54 status [place] chemotherapy[place]radiation [place]. 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 Lumbar discitis 2015 7. Diabetes 8. Asthma 9. Multiple lipomas 10. Family history of colon cancer  Invitaepanel 2020-POLE VUS 11.Left nephrectomy at age 54 12 Port-A-Cath placement, Dr. GJohney Maine 03/16/2019 13. Thrombocytopenia secondary to chemotherapy-oxaliplatin dose reduced beginning with cycle 3 FOLFOX   Disposition: [name] stable.  He has completed 6 cycles of FOLFOX.  Plan to proceed with cycle 7 today as scheduled with oxaliplatin.  We reviewed the CBC from today.  The thrombocytopenia has improved.  Counts are adequate to proceed.  He understands to contact the office with any bleeding.  He will return for lab, follow-up, cycle 8 FOLFOX in 2 weeks.  He will contact the office in the interim with any problems.  Plan reviewed with Dr. SBenay Spice  LNed CardANP/GNP-BC   06/13/2019  9:23 AM

## 2019-06-13 NOTE — Progress Notes (Signed)
Ok to proceed with plts 88 per Ned Card, NP

## 2019-06-13 NOTE — Patient Instructions (Signed)
[place] Discharge Instructions for Patients Receiving Chemotherapy  Today you received the following chemotherapy agents oxaliplatin/leucovoroin/florouracil.    To help prevent nausea and vomiting after your treatment, we encourage you to take your nausea medication as directed   If you develop nausea and vomiting that is not controlled by your nausea medication, call the clinic.   BELOW ARE SYMPTOMS THAT SHOULD BE REPORTED IMMEDIATELY:  *FEVER GREATER THAN 100.5 F  *CHILLS WITH OR WITHOUT FEVER  NAUSEA AND VOMITING THAT IS NOT CONTROLLED WITH YOUR NAUSEA MEDICATION  *UNUSUAL SHORTNESS OF BREATH  *UNUSUAL BRUISING OR BLEEDING  TENDERNESS IN MOUTH AND THROAT WITH OR WITHOUT PRESENCE OF ULCERS  *URINARY PROBLEMS  *BOWEL PROBLEMS  UNUSUAL RASH Items with * indicate a potential emergency and should be followed up as soon as possible.  Feel free to call the clinic you have any questions or concerns. The clinic phone number is [phone].

## 2019-06-14 LAB — PROSTATE-SPECIFIC AG, SERUM (LABCORP): Prostate Specific Ag, Serum: 4.3 ng/mL — ABNORMAL HIGH (ref 0.0–4.0)

## 2019-06-15 ENCOUNTER — Other Ambulatory Visit: Payer: Self-pay

## 2019-06-15 ENCOUNTER — Inpatient Hospital Stay: Payer: BC Managed Care – PPO

## 2019-06-15 ENCOUNTER — Telehealth: Payer: Self-pay

## 2019-06-15 ENCOUNTER — Telehealth: Payer: Self-pay | Admitting: Oncology

## 2019-06-15 VITALS — BP 122/78 | HR 78 | Temp 98.2°F | Resp 18

## 2019-06-15 DIAGNOSIS — Z5111 Encounter for antineoplastic chemotherapy: Secondary | ICD-10-CM | POA: Diagnosis not present

## 2019-06-15 DIAGNOSIS — C186 Malignant neoplasm of descending colon: Secondary | ICD-10-CM

## 2019-06-15 DIAGNOSIS — C184 Malignant neoplasm of transverse colon: Secondary | ICD-10-CM | POA: Diagnosis not present

## 2019-06-15 DIAGNOSIS — D6959 Other secondary thrombocytopenia: Secondary | ICD-10-CM | POA: Diagnosis not present

## 2019-06-15 MED ORDER — HEPARIN SOD (PORK) LOCK FLUSH 100 UNIT/ML IV SOLN
500.0000 [IU] | Freq: Once | INTRAVENOUS | Status: AC | PRN
  Administered 2019-06-15: 11:00:00 500 [IU]
  Filled 2019-06-15: qty 5

## 2019-06-15 MED ORDER — SODIUM CHLORIDE 0.9% FLUSH
10.0000 mL | INTRAVENOUS | Status: DC | PRN
  Administered 2019-06-15: 10 mL
  Filled 2019-06-15: qty 10

## 2019-06-15 NOTE — Telephone Encounter (Signed)
Spoke with patient regarding PSA level. Pt verbalizes understanding and is appreciative of call

## 2019-06-15 NOTE — Telephone Encounter (Signed)
Scheduled per los. Called and left msg. Mailed printout  °

## 2019-06-15 NOTE — Telephone Encounter (Signed)
-----   Message from Ladell Pier, MD sent at 06/14/2019  8:13 PM EDT ----- Please call patient, PSA is mildly elevated, likely benign finding, we will discuss at next visit and consider referring to urology

## 2019-06-19 DIAGNOSIS — C186 Malignant neoplasm of descending colon: Secondary | ICD-10-CM | POA: Diagnosis not present

## 2019-06-21 NOTE — Progress Notes (Signed)
Pharmacist Chemotherapy Monitoring - Follow Up Assessment    I verify that I have reviewed each item in the below checklist:  . Regimen for the patient is scheduled for the appropriate day and plan matches scheduled date. Marland Kitchen Appropriate non-routine labs are ordered dependent on drug ordered. . If applicable, additional medications reviewed and ordered per protocol based on lifetime cumulative doses and/or treatment regimen.   Plan for follow-up and/or issues identified: No . I-vent associated with next due treatment: No . MD and/or nursing notified: No  Philomena Course 06/21/2019 9:22 AM

## 2019-06-26 ENCOUNTER — Other Ambulatory Visit: Payer: Self-pay | Admitting: Oncology

## 2019-06-27 ENCOUNTER — Telehealth: Payer: Self-pay | Admitting: Oncology

## 2019-06-27 ENCOUNTER — Inpatient Hospital Stay: Payer: BC Managed Care – PPO | Admitting: Oncology

## 2019-06-27 ENCOUNTER — Inpatient Hospital Stay: Payer: BC Managed Care – PPO

## 2019-06-27 ENCOUNTER — Other Ambulatory Visit: Payer: Self-pay

## 2019-06-27 VITALS — BP 137/88 | HR 87 | Resp 18 | Ht 72.0 in | Wt 196.5 lb

## 2019-06-27 DIAGNOSIS — C186 Malignant neoplasm of descending colon: Secondary | ICD-10-CM

## 2019-06-27 DIAGNOSIS — Z5111 Encounter for antineoplastic chemotherapy: Secondary | ICD-10-CM | POA: Diagnosis not present

## 2019-06-27 DIAGNOSIS — D6959 Other secondary thrombocytopenia: Secondary | ICD-10-CM | POA: Diagnosis not present

## 2019-06-27 DIAGNOSIS — Z95828 Presence of other vascular implants and grafts: Secondary | ICD-10-CM

## 2019-06-27 DIAGNOSIS — C184 Malignant neoplasm of transverse colon: Secondary | ICD-10-CM | POA: Diagnosis not present

## 2019-06-27 LAB — CMP (CANCER CENTER ONLY)
ALT: 37 U/L (ref 0–44)
AST: 31 U/L (ref 15–41)
Albumin: 3.3 g/dL — ABNORMAL LOW (ref 3.5–5.0)
Alkaline Phosphatase: 65 U/L (ref 38–126)
Anion gap: 11 (ref 5–15)
BUN: 14 mg/dL (ref 6–20)
CO2: 23 mmol/L (ref 22–32)
Calcium: 8.9 mg/dL (ref 8.9–10.3)
Chloride: 105 mmol/L (ref 98–111)
Creatinine: 1.01 mg/dL (ref 0.61–1.24)
GFR, Est AFR Am: >60 mL/min (ref >60)
GFR, Estimated: >60 mL/min (ref >60)
Glucose, Bld: 257 mg/dL — ABNORMAL HIGH (ref 70–99)
Potassium: 4.2 mmol/L (ref 3.5–5.1)
Sodium: 139 mmol/L (ref 135–145)
Total Bilirubin: 0.8 mg/dL (ref 0.3–1.2)
Total Protein: 6.5 g/dL (ref 6.5–8.1)

## 2019-06-27 LAB — CBC WITH DIFFERENTIAL (CANCER CENTER ONLY)
Abs Immature Granulocytes: 0.02 10*3/uL (ref 0.00–0.07)
Basophils Absolute: 0 10*3/uL (ref 0.0–0.1)
Basophils Relative: 1 %
Eosinophils Absolute: 0.1 10*3/uL (ref 0.0–0.5)
Eosinophils Relative: 3 %
HCT: 41.7 % (ref 39.0–52.0)
Hemoglobin: 14.3 g/dL (ref 13.0–17.0)
Immature Granulocytes: 0 %
Lymphocytes Relative: 15 %
Lymphs Abs: 0.8 10*3/uL (ref 0.7–4.0)
MCH: 34 pg (ref 26.0–34.0)
MCHC: 34.3 g/dL (ref 30.0–36.0)
MCV: 99 fL (ref 80.0–100.0)
Monocytes Absolute: 0.6 10*3/uL (ref 0.1–1.0)
Monocytes Relative: 10 %
Neutro Abs: 3.9 10*3/uL (ref 1.7–7.7)
Neutrophils Relative %: 71 %
Platelet Count: 65 10*3/uL — ABNORMAL LOW (ref 150–400)
RBC: 4.21 MIL/uL — ABNORMAL LOW (ref 4.22–5.81)
RDW: 14.6 % (ref 11.5–15.5)
WBC Count: 5.5 10*3/uL (ref 4.0–10.5)
nRBC: 0 % (ref 0.0–0.2)

## 2019-06-27 MED ORDER — DEXTROSE 5 % IV SOLN
Freq: Once | INTRAVENOUS | Status: AC
  Filled 2019-06-27: qty 250

## 2019-06-27 MED ORDER — SODIUM CHLORIDE 0.9% FLUSH
10.0000 mL | INTRAVENOUS | Status: DC | PRN
  Administered 2019-06-27: 10 mL
  Filled 2019-06-27: qty 10

## 2019-06-27 MED ORDER — DEXAMETHASONE SODIUM PHOSPHATE 10 MG/ML IJ SOLN
INTRAMUSCULAR | Status: AC
  Filled 2019-06-27: qty 1

## 2019-06-27 MED ORDER — SODIUM CHLORIDE 0.9 % IV SOLN
2400.0000 mg/m2 | INTRAVENOUS | Status: DC
  Administered 2019-06-27: 5000 mg via INTRAVENOUS
  Filled 2019-06-27: qty 100

## 2019-06-27 MED ORDER — DEXAMETHASONE SODIUM PHOSPHATE 10 MG/ML IJ SOLN
10.0000 mg | Freq: Once | INTRAMUSCULAR | Status: AC
  Administered 2019-06-27: 10 mg via INTRAVENOUS

## 2019-06-27 MED ORDER — PALONOSETRON HCL INJECTION 0.25 MG/5ML
0.2500 mg | Freq: Once | INTRAVENOUS | Status: AC
  Administered 2019-06-27: 0.25 mg via INTRAVENOUS

## 2019-06-27 MED ORDER — OXALIPLATIN CHEMO INJECTION 100 MG/20ML
65.0000 mg/m2 | Freq: Once | INTRAVENOUS | Status: AC
  Administered 2019-06-27: 135 mg via INTRAVENOUS
  Filled 2019-06-27: qty 20

## 2019-06-27 MED ORDER — LEUCOVORIN CALCIUM INJECTION 350 MG
400.0000 mg/m2 | Freq: Once | INTRAVENOUS | Status: AC
  Administered 2019-06-27: 10:00:00 836 mg via INTRAVENOUS
  Filled 2019-06-27: qty 41.8

## 2019-06-27 MED ORDER — FLUOROURACIL CHEMO INJECTION 2.5 GM/50ML
400.0000 mg/m2 | Freq: Once | INTRAVENOUS | Status: AC
  Administered 2019-06-27: 13:00:00 850 mg via INTRAVENOUS
  Filled 2019-06-27: qty 17

## 2019-06-27 MED ORDER — PALONOSETRON HCL INJECTION 0.25 MG/5ML
INTRAVENOUS | Status: AC
  Filled 2019-06-27: qty 5

## 2019-06-27 NOTE — Progress Notes (Signed)
[place] OFFICE PROGRESS NOTE   Diagnosis: Colon cancer  INTERVAL HISTORY:   [name] returns as scheduled.  He completed another cycle of FOLFOX on 06/13/2019.  No nausea or diarrhea.  Mild peripheral tingling and cold sensitivity following chemotherapy.  This has resolved.  Mild bleeding with blowing his nose.  No other bleeding.  He feels well.  He is exercising.  He has received the first dose of the COVID-19 vaccine. He has noticed a knot at a right forearm venous puncture site Objective:  Vital signs in last 24 hours:  Blood pressure 137/88, pulse 87, resp. rate 18, height 6' (1.829 m), weight 196 lb 8 oz (89.1 kg), SpO2 98 %.    Limited physical examination secondary to distancing with the Covid pandemic GI: No hepatosplenomegaly, nontender Vascular: No leg edema, small palpable cord at the right antecubital fossa without erythema or tenderness Neuro: Mild loss of vibratory sense at the fingertips bilaterally   Portacath/PICC-without erythema  Lab Results:  Lab Results  Component Value Date   WBC 5.5 06/27/2019   HGB 14.3 06/27/2019   HCT 41.7 06/27/2019   MCV 99.0 06/27/2019   PLT 65 (L) 06/27/2019   NEUTROABS 3.9 06/27/2019    CMP  Lab Results  Component Value Date   NA 139 06/27/2019   K 4.2 06/27/2019   CL 105 06/27/2019   CO2 23 06/27/2019   GLUCOSE 257 (H) 06/27/2019   BUN 14 06/27/2019   CREATININE 1.01 06/27/2019   CALCIUM 8.9 06/27/2019   PROT 6.5 06/27/2019   ALBUMIN 3.3 (L) 06/27/2019   AST 31 06/27/2019   ALT 37 06/27/2019   ALKPHOS 65 06/27/2019   BILITOT 0.8 06/27/2019   GFRNONAA >60 06/27/2019   GFRAA >60 06/27/2019     Medications: I have reviewed the patient's current medications.   Assessment/Plan: 1. 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 Mass felt to be in the  transverse colon on a colonoscopy 01/07/2019 with a biopsy confirming poorly differentiated adenocarcinoma with signet ring cell features, mismatch repair protein expression normal ? CT abdomen/pelvis 01/08/2019 12.3 cm indeterminate splenic mass, present in 2015 on a lumbar MRI-felt to be benign, small subpleural and pleural-based nodules at the lung bases, status post left nephrectomy ? CT chest 01/20/2019-small bilateral pulmonary nodules with rounded parenchymal nodules in the right lower lobe, indeterminate splenic mass ? CT chest 03/18/2019-multiple subcentimeter pulmonary nodules again seen bilaterally, greatest in the lower lobes, without significant change. No new or enlarging pulmonary nodules or masses. Partially visualized large heterogeneous splenic mass with no significant change. Plan for follow-up chest CT at a 46-monthinterval. ? Cycle 1 FOLFOX 03/21/2019 ? Cycle 2 FOLFOX 04/04/2019 ? Cycle 3 FOLFOX 04/18/2019 (oxaliplatin dose reduced secondary to thrombocytopenia) ? Cycle 4 FOLFOX 05/02/2019 (oxaliplatin held secondary to thrombocytopenia) ? Cycle 5 FOLFOX 05/16/2019 ? Cycle 6 FOLFOX 05/30/2019 (oxaliplatin held secondary to thrombocytopenia) ? CT chest 06/08/2019-stable scattered small solid pulmonary nodules  ? Cycle 7 FOLFOX 06/13/2019 ? Cycle 8 FOLFOX 06/27/2019  2. Multiple colon polyps-ascending colon polyps on the colonoscopy 01/07/2019-not removed 3. Wilms tumor at age 54 status post chemotherapy[place]radiation and [place]. 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 Lumbar discitis 2015 7. Diabetes 8. Asthma 9. Multiple lipomas 10. Family history of colon cancer  Invitaepanel 2020-POLE VUS 11.Left nephrectomy at  age 73 12. Port-A-Cath placement, Dr. Johney Maine, 03/16/2019 13. Thrombocytopenia secondary to chemotherapy-oxaliplatin dose reduced beginning with cycle 3 FOLFOX 14.   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 appears well.  He has completed 7 cycles of adjuvant therapy.  He has stable moderate thrombocytopenia.  The oxaliplatin was dose reduced beginning with cycle 3.  The platelet count has not changed significantly with the last several cycles of chemotherapy.  He will complete chemotherapy with oxaliplatin today.  He will call for bleeding or bruising. [name] will return for an office visit and chemotherapy in 2 weeks.  He has a small palpable area of phlebitis at a right forearm IV puncture site.  He will call for erythema or swelling in this area.  Betsy Coder, MD  06/27/2019  9:08 AM

## 2019-06-27 NOTE — Progress Notes (Signed)
Per Dr. Sherrill: OK to treat with platelet count 65,000 

## 2019-06-27 NOTE — Telephone Encounter (Signed)
Scheduled per los. Gave printout in infusion  

## 2019-06-29 ENCOUNTER — Other Ambulatory Visit: Payer: Self-pay

## 2019-06-29 ENCOUNTER — Inpatient Hospital Stay: Payer: BC Managed Care – PPO

## 2019-06-29 VITALS — BP 147/91 | HR 76 | Temp 98.0°F | Resp 19

## 2019-06-29 DIAGNOSIS — C186 Malignant neoplasm of descending colon: Secondary | ICD-10-CM | POA: Diagnosis not present

## 2019-06-29 DIAGNOSIS — Z95828 Presence of other vascular implants and grafts: Secondary | ICD-10-CM

## 2019-06-29 DIAGNOSIS — Z5111 Encounter for antineoplastic chemotherapy: Secondary | ICD-10-CM | POA: Diagnosis not present

## 2019-06-29 DIAGNOSIS — D6959 Other secondary thrombocytopenia: Secondary | ICD-10-CM | POA: Diagnosis not present

## 2019-06-29 DIAGNOSIS — C184 Malignant neoplasm of transverse colon: Secondary | ICD-10-CM | POA: Diagnosis not present

## 2019-06-29 MED ORDER — HEPARIN SOD (PORK) LOCK FLUSH 100 UNIT/ML IV SOLN
500.0000 [IU] | Freq: Once | INTRAVENOUS | Status: AC | PRN
  Administered 2019-06-29: 500 [IU]
  Filled 2019-06-29: qty 5

## 2019-06-29 MED ORDER — SODIUM CHLORIDE 0.9% FLUSH
10.0000 mL | INTRAVENOUS | Status: DC | PRN
  Administered 2019-06-29: 10 mL
  Filled 2019-06-29: qty 10

## 2019-07-05 NOTE — Progress Notes (Signed)
Pharmacist Chemotherapy Monitoring - Follow Up Assessment    I verify that I have reviewed each item in the below checklist:  . Regimen for the patient is scheduled for the appropriate day and plan matches scheduled date. Marland Kitchen Appropriate non-routine labs are ordered dependent on drug ordered. . If applicable, additional medications reviewed and ordered per protocol based on lifetime cumulative doses and/or treatment regimen.   Plan for follow-up and/or issues identified: No . I-vent associated with next due treatment: No . MD and/or nursing notified: No  Jeree Delcid D 07/05/2019 4:03 PM

## 2019-07-10 ENCOUNTER — Other Ambulatory Visit: Payer: Self-pay | Admitting: Oncology

## 2019-07-11 ENCOUNTER — Inpatient Hospital Stay: Payer: BC Managed Care – PPO

## 2019-07-11 ENCOUNTER — Other Ambulatory Visit: Payer: Self-pay

## 2019-07-11 ENCOUNTER — Inpatient Hospital Stay: Payer: BC Managed Care – PPO | Attending: Genetic Counselor | Admitting: Oncology

## 2019-07-11 VITALS — BP 142/95 | HR 73 | Temp 98.2°F | Resp 18 | Ht 72.0 in | Wt 195.8 lb

## 2019-07-11 DIAGNOSIS — C186 Malignant neoplasm of descending colon: Secondary | ICD-10-CM | POA: Diagnosis not present

## 2019-07-11 DIAGNOSIS — C184 Malignant neoplasm of transverse colon: Secondary | ICD-10-CM | POA: Insufficient documentation

## 2019-07-11 DIAGNOSIS — Z95828 Presence of other vascular implants and grafts: Secondary | ICD-10-CM

## 2019-07-11 DIAGNOSIS — C182 Malignant neoplasm of ascending colon: Secondary | ICD-10-CM | POA: Diagnosis not present

## 2019-07-11 DIAGNOSIS — Z5111 Encounter for antineoplastic chemotherapy: Secondary | ICD-10-CM | POA: Diagnosis not present

## 2019-07-11 LAB — CBC WITH DIFFERENTIAL (CANCER CENTER ONLY)
Abs Immature Granulocytes: 0.02 10*3/uL (ref 0.00–0.07)
Basophils Absolute: 0 10*3/uL (ref 0.0–0.1)
Basophils Relative: 1 %
Eosinophils Absolute: 0.2 10*3/uL (ref 0.0–0.5)
Eosinophils Relative: 3 %
HCT: 41.1 % (ref 39.0–52.0)
Hemoglobin: 14.3 g/dL (ref 13.0–17.0)
Immature Granulocytes: 0 %
Lymphocytes Relative: 20 %
Lymphs Abs: 0.9 10*3/uL (ref 0.7–4.0)
MCH: 33.8 pg (ref 26.0–34.0)
MCHC: 34.8 g/dL (ref 30.0–36.0)
MCV: 97.2 fL (ref 80.0–100.0)
Monocytes Absolute: 0.6 10*3/uL (ref 0.1–1.0)
Monocytes Relative: 12 %
Neutro Abs: 3 10*3/uL (ref 1.7–7.7)
Neutrophils Relative %: 64 %
Platelet Count: 73 10*3/uL — ABNORMAL LOW (ref 150–400)
RBC: 4.23 MIL/uL (ref 4.22–5.81)
RDW: 14.3 % (ref 11.5–15.5)
WBC Count: 4.8 10*3/uL (ref 4.0–10.5)
nRBC: 0 % (ref 0.0–0.2)

## 2019-07-11 LAB — CMP (CANCER CENTER ONLY)
ALT: 48 U/L — ABNORMAL HIGH (ref 0–44)
AST: 43 U/L — ABNORMAL HIGH (ref 15–41)
Albumin: 3.3 g/dL — ABNORMAL LOW (ref 3.5–5.0)
Alkaline Phosphatase: 79 U/L (ref 38–126)
Anion gap: 8 (ref 5–15)
BUN: 15 mg/dL (ref 6–20)
CO2: 23 mmol/L (ref 22–32)
Calcium: 9.1 mg/dL (ref 8.9–10.3)
Chloride: 106 mmol/L (ref 98–111)
Creatinine: 1.01 mg/dL (ref 0.61–1.24)
GFR, Est AFR Am: >60 mL/min (ref >60)
GFR, Estimated: >60 mL/min (ref >60)
Glucose, Bld: 253 mg/dL — ABNORMAL HIGH (ref 70–99)
Potassium: 4.2 mmol/L (ref 3.5–5.1)
Sodium: 137 mmol/L (ref 135–145)
Total Bilirubin: 0.7 mg/dL (ref 0.3–1.2)
Total Protein: 6.8 g/dL (ref 6.5–8.1)

## 2019-07-11 MED ORDER — LEUCOVORIN CALCIUM INJECTION 350 MG
400.0000 mg/m2 | Freq: Once | INTRAVENOUS | Status: AC
  Administered 2019-07-11: 13:00:00 836 mg via INTRAVENOUS
  Filled 2019-07-11: qty 41.8

## 2019-07-11 MED ORDER — PALONOSETRON HCL INJECTION 0.25 MG/5ML
0.2500 mg | Freq: Once | INTRAVENOUS | Status: AC
  Administered 2019-07-11: 0.25 mg via INTRAVENOUS

## 2019-07-11 MED ORDER — SODIUM CHLORIDE 0.9 % IV SOLN
10.0000 mg | Freq: Once | INTRAVENOUS | Status: AC
  Administered 2019-07-11: 12:00:00 10 mg via INTRAVENOUS
  Filled 2019-07-11: qty 10

## 2019-07-11 MED ORDER — FLUOROURACIL CHEMO INJECTION 2.5 GM/50ML
400.0000 mg/m2 | Freq: Once | INTRAVENOUS | Status: AC
  Administered 2019-07-11: 850 mg via INTRAVENOUS
  Filled 2019-07-11: qty 17

## 2019-07-11 MED ORDER — DEXTROSE 5 % IV SOLN
Freq: Once | INTRAVENOUS | Status: AC
  Filled 2019-07-11: qty 250

## 2019-07-11 MED ORDER — SODIUM CHLORIDE 0.9% FLUSH
10.0000 mL | INTRAVENOUS | Status: DC | PRN
  Administered 2019-07-11: 10 mL
  Filled 2019-07-11: qty 10

## 2019-07-11 MED ORDER — SODIUM CHLORIDE 0.9 % IV SOLN
2400.0000 mg/m2 | INTRAVENOUS | Status: DC
  Administered 2019-07-11: 16:00:00 5000 mg via INTRAVENOUS
  Filled 2019-07-11: qty 100

## 2019-07-11 MED ORDER — PALONOSETRON HCL INJECTION 0.25 MG/5ML
INTRAVENOUS | Status: AC
  Filled 2019-07-11: qty 5

## 2019-07-11 MED ORDER — OXALIPLATIN CHEMO INJECTION 100 MG/20ML
65.0000 mg/m2 | Freq: Once | INTRAVENOUS | Status: AC
  Administered 2019-07-11: 13:00:00 135 mg via INTRAVENOUS
  Filled 2019-07-11: qty 27

## 2019-07-11 NOTE — Progress Notes (Signed)
Per Dr. Benay Spice: OK to treat w/platelet count 73,000

## 2019-07-11 NOTE — Patient Instructions (Signed)
[place] Discharge Instructions for Patients Receiving Chemotherapy  Today you received the following chemotherapy agents oxaliplatin/leucovoroin/florouracil.    To help prevent nausea and vomiting after your treatment, we encourage you to take your nausea medication as directed   If you develop nausea and vomiting that is not controlled by your nausea medication, call the clinic.   BELOW ARE SYMPTOMS THAT SHOULD BE REPORTED IMMEDIATELY:  *FEVER GREATER THAN 100.5 F  *CHILLS WITH OR WITHOUT FEVER  NAUSEA AND VOMITING THAT IS NOT CONTROLLED WITH YOUR NAUSEA MEDICATION  *UNUSUAL SHORTNESS OF BREATH  *UNUSUAL BRUISING OR BLEEDING  TENDERNESS IN MOUTH AND THROAT WITH OR WITHOUT PRESENCE OF ULCERS  *URINARY PROBLEMS  *BOWEL PROBLEMS  UNUSUAL RASH Items with * indicate a potential emergency and should be followed up as soon as possible.  Feel free to call the clinic you have any questions or concerns. The clinic phone number is [phone].

## 2019-07-11 NOTE — Patient Instructions (Signed)

## 2019-07-11 NOTE — Progress Notes (Signed)
[place] OFFICE PROGRESS NOTE   Diagnosis: Colon cancer  INTERVAL HISTORY:   [name] completed another cycle of FOLFOX on 06/27/2019.  No nausea, mouth sores, or diarrhea.  He reports cold sensitivity and prolonged "tingling "following this cycle of chemotherapy.  He has persistent mild tingling in the left hand.  No other neuropathy symptoms.  The neuropathy symptoms do not interfere with activity.  He feels well.  He is exercising.  He received the second dose of the COVID-19 vaccine and had flulike symptoms for less than 1 day.  Objective:  Vital signs in last 24 hours:  Blood pressure (!) 142/95, pulse 73, temperature 98.2 F (36.8 C), temperature source Temporal, resp. rate 18, height 6' (1.829 m), weight 195 lb 12.8 oz (88.8 kg), SpO2 100 %.    HEENT: No thrush or ulcers GI: Nontender, no hepatosplenomegaly, no mass Vascular: No leg edema Neuro: Mild loss of vibratory sense at the fingertips bilaterally  Portacath/PICC-without erythema  Lab Results:  Lab Results  Component Value Date   WBC 4.8 07/11/2019   HGB 14.3 07/11/2019   HCT 41.1 07/11/2019   MCV 97.2 07/11/2019   PLT 73 (L) 07/11/2019   NEUTROABS 3.0 07/11/2019    CMP  Lab Results  Component Value Date   NA 137 07/11/2019   K 4.2 07/11/2019   CL 106 07/11/2019   CO2 23 07/11/2019   GLUCOSE 253 (H) 07/11/2019   BUN 15 07/11/2019   CREATININE 1.01 07/11/2019   CALCIUM 9.1 07/11/2019   PROT 6.8 07/11/2019   ALBUMIN 3.3 (L) 07/11/2019   AST 43 (H) 07/11/2019   ALT 48 (H) 07/11/2019   ALKPHOS 79 07/11/2019   BILITOT 0.7 07/11/2019   GFRNONAA >60 07/11/2019   GFRAA >60 07/11/2019    Medications: I have reviewed the patient's current medications.   Assessment/Plan: 1. 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 Mass felt to be in the  transverse colon on a colonoscopy 01/07/2019 with a biopsy confirming poorly differentiated adenocarcinoma with signet ring cell features, mismatch repair protein expression normal ? CT abdomen/pelvis 01/08/2019 12.3 cm indeterminate splenic mass, present in 2015 on a lumbar MRI-felt to be benign, small subpleural and pleural-based nodules at the lung bases, status post left nephrectomy ? CT chest 01/20/2019-small bilateral pulmonary nodules with rounded parenchymal nodules in the right lower lobe, indeterminate splenic mass ? CT chest 03/18/2019-multiple subcentimeter pulmonary nodules again seen bilaterally, greatest in the lower lobes, without significant change. No new or enlarging pulmonary nodules or masses. Partially visualized large heterogeneous splenic mass with no significant change. Plan for follow-up chest CT at a 70-monthinterval. ? Cycle 1 FOLFOX 03/21/2019 ? Cycle 2 FOLFOX 04/04/2019 ? Cycle 3 FOLFOX 04/18/2019 (oxaliplatin dose reduced secondary to thrombocytopenia) ? Cycle 4 FOLFOX 05/02/2019 (oxaliplatin held secondary to thrombocytopenia) ? Cycle 5 FOLFOX 05/16/2019 ? Cycle 6 FOLFOX 05/30/2019 (oxaliplatin held secondary to thrombocytopenia) ? CT chest 06/08/2019-stable scattered small solid pulmonary nodules  ? Cycle 7 FOLFOX 06/13/2019 ? Cycle 8 FOLFOX 06/27/2019 ? Cycle 9 FOLFOX 07/11/2019  2. Multiple colon polyps-ascending colon polyps on the colonoscopy 01/07/2019-not removed 3. Wilms tumor at age 54 status post chemotherapy[place]radiation and [place]. 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 Lumbar discitis 2015 7. Diabetes 8. Asthma 9. Multiple lipomas 10. Family history of colon cancer  Invitaepanel 2020-POLE VUS 11.Left nephrectomy at age 45 12. Port-A-Cath placement, Dr. Johney Maine, 03/16/2019 13. Thrombocytopenia secondary to chemotherapy-oxaliplatin dose reduced beginning with  cycle 3 FOLFOX 14.  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 Loyal appears stable.  He has stable moderate thrombocytopenia secondary to chemotherapy.  He has developed mild oxaliplatin neuropathy symptoms.  The plan is to proceed with another cycle of FOLFOX today.  We will monitor the neuropathy symptoms closely and discontinue oxaliplatin for progressive oxaliplatin neuropathy.  Mr. Strojny will return for an office visit and chemotherapy in 2 weeks.  Betsy Coder, MD  07/11/2019  11:34 AM

## 2019-07-12 ENCOUNTER — Telehealth: Payer: Self-pay | Admitting: Internal Medicine

## 2019-07-12 NOTE — Telephone Encounter (Signed)
Scheduled per los. Called and left msg. Mailed printout  °

## 2019-07-13 ENCOUNTER — Other Ambulatory Visit: Payer: Self-pay

## 2019-07-13 ENCOUNTER — Inpatient Hospital Stay: Payer: BC Managed Care – PPO

## 2019-07-13 VITALS — BP 129/88 | HR 72 | Temp 98.2°F | Resp 18

## 2019-07-13 DIAGNOSIS — Z5111 Encounter for antineoplastic chemotherapy: Secondary | ICD-10-CM | POA: Diagnosis not present

## 2019-07-13 DIAGNOSIS — C184 Malignant neoplasm of transverse colon: Secondary | ICD-10-CM | POA: Diagnosis not present

## 2019-07-13 DIAGNOSIS — C186 Malignant neoplasm of descending colon: Secondary | ICD-10-CM

## 2019-07-13 MED ORDER — SODIUM CHLORIDE 0.9% FLUSH
10.0000 mL | INTRAVENOUS | Status: DC | PRN
  Administered 2019-07-13: 10 mL
  Filled 2019-07-13: qty 10

## 2019-07-13 MED ORDER — HEPARIN SOD (PORK) LOCK FLUSH 100 UNIT/ML IV SOLN
500.0000 [IU] | Freq: Once | INTRAVENOUS | Status: AC | PRN
  Administered 2019-07-13: 500 [IU]
  Filled 2019-07-13: qty 5

## 2019-07-14 DIAGNOSIS — E119 Type 2 diabetes mellitus without complications: Secondary | ICD-10-CM | POA: Diagnosis not present

## 2019-07-18 DIAGNOSIS — D49512 Neoplasm of unspecified behavior of left kidney: Secondary | ICD-10-CM | POA: Diagnosis not present

## 2019-07-18 DIAGNOSIS — Z905 Acquired absence of kidney: Secondary | ICD-10-CM | POA: Diagnosis not present

## 2019-07-18 DIAGNOSIS — R972 Elevated prostate specific antigen [PSA]: Secondary | ICD-10-CM | POA: Diagnosis not present

## 2019-07-18 DIAGNOSIS — N500 Atrophy of testis: Secondary | ICD-10-CM | POA: Diagnosis not present

## 2019-07-19 NOTE — Progress Notes (Signed)
Pharmacist Chemotherapy Monitoring - Follow Up Assessment    I verify that I have reviewed each item in the below checklist:  . Regimen for the patient is scheduled for the appropriate day and plan matches scheduled date. Marland Kitchen Appropriate non-routine labs are ordered dependent on drug ordered. . If applicable, additional medications reviewed and ordered per protocol based on lifetime cumulative doses and/or treatment regimen.   Plan for follow-up and/or issues identified: No . I-vent associated with next due treatment: No . MD and/or nursing notified: No  [name] [facility] 07/19/2019 11:27 AM

## 2019-07-20 ENCOUNTER — Other Ambulatory Visit: Payer: Self-pay | Admitting: Oncology

## 2019-07-20 DIAGNOSIS — C186 Malignant neoplasm of descending colon: Secondary | ICD-10-CM | POA: Diagnosis not present

## 2019-07-22 MED FILL — Dexamethasone Sodium Phosphate Inj 100 MG/10ML: INTRAMUSCULAR | Qty: 1 | Status: AC

## 2019-07-25 ENCOUNTER — Inpatient Hospital Stay: Payer: BC Managed Care – PPO

## 2019-07-25 ENCOUNTER — Other Ambulatory Visit: Payer: Self-pay

## 2019-07-25 ENCOUNTER — Encounter: Payer: Self-pay | Admitting: Nurse Practitioner

## 2019-07-25 ENCOUNTER — Inpatient Hospital Stay: Payer: BC Managed Care – PPO | Admitting: Nurse Practitioner

## 2019-07-25 VITALS — BP 129/83 | HR 90 | Temp 98.0°F | Resp 18 | Ht 72.0 in | Wt 195.2 lb

## 2019-07-25 DIAGNOSIS — C186 Malignant neoplasm of descending colon: Secondary | ICD-10-CM

## 2019-07-25 DIAGNOSIS — Z5111 Encounter for antineoplastic chemotherapy: Secondary | ICD-10-CM | POA: Diagnosis not present

## 2019-07-25 DIAGNOSIS — C184 Malignant neoplasm of transverse colon: Secondary | ICD-10-CM | POA: Diagnosis not present

## 2019-07-25 DIAGNOSIS — Z95828 Presence of other vascular implants and grafts: Secondary | ICD-10-CM

## 2019-07-25 LAB — CMP (CANCER CENTER ONLY)
ALT: 54 U/L — ABNORMAL HIGH (ref 0–44)
AST: 44 U/L — ABNORMAL HIGH (ref 15–41)
Albumin: 3.1 g/dL — ABNORMAL LOW (ref 3.5–5.0)
Alkaline Phosphatase: 85 U/L (ref 38–126)
Anion gap: 11 (ref 5–15)
BUN: 12 mg/dL (ref 6–20)
CO2: 25 mmol/L (ref 22–32)
Calcium: 9 mg/dL (ref 8.9–10.3)
Chloride: 102 mmol/L (ref 98–111)
Creatinine: 1.13 mg/dL (ref 0.61–1.24)
GFR, Est AFR Am: >60 mL/min (ref >60)
GFR, Estimated: >60 mL/min (ref >60)
Glucose, Bld: 388 mg/dL — ABNORMAL HIGH (ref 70–99)
Potassium: 4.4 mmol/L (ref 3.5–5.1)
Sodium: 138 mmol/L (ref 135–145)
Total Bilirubin: 0.9 mg/dL (ref 0.3–1.2)
Total Protein: 6.5 g/dL (ref 6.5–8.1)

## 2019-07-25 LAB — CBC WITH DIFFERENTIAL (CANCER CENTER ONLY)
Abs Immature Granulocytes: 0.01 10*3/uL (ref 0.00–0.07)
Basophils Absolute: 0 10*3/uL (ref 0.0–0.1)
Basophils Relative: 1 %
Eosinophils Absolute: 0.1 10*3/uL (ref 0.0–0.5)
Eosinophils Relative: 2 %
HCT: 42.6 % (ref 39.0–52.0)
Hemoglobin: 14.4 g/dL (ref 13.0–17.0)
Immature Granulocytes: 0 %
Lymphocytes Relative: 13 %
Lymphs Abs: 0.6 10*3/uL — ABNORMAL LOW (ref 0.7–4.0)
MCH: 33.7 pg (ref 26.0–34.0)
MCHC: 33.8 g/dL (ref 30.0–36.0)
MCV: 99.8 fL (ref 80.0–100.0)
Monocytes Absolute: 0.6 10*3/uL (ref 0.1–1.0)
Monocytes Relative: 12 %
Neutro Abs: 3.4 10*3/uL (ref 1.7–7.7)
Neutrophils Relative %: 72 %
Platelet Count: 52 10*3/uL — ABNORMAL LOW (ref 150–400)
RBC: 4.27 MIL/uL (ref 4.22–5.81)
RDW: 14.3 % (ref 11.5–15.5)
WBC Count: 4.7 10*3/uL (ref 4.0–10.5)
nRBC: 0 % (ref 0.0–0.2)

## 2019-07-25 MED ORDER — LEUCOVORIN CALCIUM INJECTION 350 MG
400.0000 mg/m2 | Freq: Once | INTRAVENOUS | Status: AC
  Administered 2019-07-25: 836 mg via INTRAVENOUS
  Filled 2019-07-25: qty 41.8

## 2019-07-25 MED ORDER — SODIUM CHLORIDE 0.9 % IV SOLN
2400.0000 mg/m2 | INTRAVENOUS | Status: DC
  Administered 2019-07-25: 5000 mg via INTRAVENOUS
  Filled 2019-07-25: qty 100

## 2019-07-25 MED ORDER — SODIUM CHLORIDE 0.9% FLUSH
10.0000 mL | INTRAVENOUS | Status: DC | PRN
  Filled 2019-07-25: qty 10

## 2019-07-25 MED ORDER — DEXTROSE 5 % IV SOLN
Freq: Once | INTRAVENOUS | Status: AC
  Filled 2019-07-25: qty 250

## 2019-07-25 MED ORDER — FLUOROURACIL CHEMO INJECTION 2.5 GM/50ML
400.0000 mg/m2 | Freq: Once | INTRAVENOUS | Status: AC
  Administered 2019-07-25: 11:00:00 850 mg via INTRAVENOUS
  Filled 2019-07-25: qty 17

## 2019-07-25 NOTE — Addendum Note (Signed)
Addended by: Tora Kindred on: 07/25/2019 09:49 AM   Modules accepted: Orders

## 2019-07-25 NOTE — Progress Notes (Signed)
Oxaliplatin held today. Changed Leucovorin to infuse over 30 min.  Kennith Center, Pharm.D., CPP 07/25/2019@9 :49 AM

## 2019-07-25 NOTE — Patient Instructions (Signed)
[facility] Discharge Instructions for Patients Receiving Chemotherapy  Today you received the following chemotherapy agents: leucovorin and fluorouracil.  To help prevent nausea and vomiting after your treatment, we encourage you to take your nausea medication as directed.   If you develop nausea and vomiting that is not controlled by your nausea medication, call the clinic.   BELOW ARE SYMPTOMS THAT SHOULD BE REPORTED IMMEDIATELY:  *FEVER GREATER THAN 100.5 F  *CHILLS WITH OR WITHOUT FEVER  NAUSEA AND VOMITING THAT IS NOT CONTROLLED WITH YOUR NAUSEA MEDICATION  *UNUSUAL SHORTNESS OF BREATH  *UNUSUAL BRUISING OR BLEEDING  TENDERNESS IN MOUTH AND THROAT WITH OR WITHOUT PRESENCE OF ULCERS  *URINARY PROBLEMS  *BOWEL PROBLEMS  UNUSUAL RASH Items with * indicate a potential emergency and should be followed up as soon as possible.  Feel free to call the clinic should you have any questions or concerns. The clinic phone number is [phone].  Please show the CHEMO ALERT CARD at check-in to the Emergency Department and triage nurse.   

## 2019-07-25 NOTE — Progress Notes (Signed)
[place] OFFICE PROGRESS NOTE   Diagnosis: Colon cancer  INTERVAL HISTORY:   [name] returns as scheduled.  He completed cycle 9 FOLFOX 07/11/2019.  He does to have very mild nausea around the time of treatment.  The nausea does not require an antiemetic.  No mouth sores.  No diarrhea.  He has intermittent tingling in the fingertips greater than the toes.  No bleeding except occasionally with nose blowing.  Objective:  Vital signs in last 24 hours:  Blood pressure 129/83, pulse 90, temperature 98 F (36.7 C), temperature source Temporal, resp. rate 18, height 6' (1.829 m), weight 195 lb 3.2 oz (88.5 kg), SpO2 99 %.    HEENT: No thrush or ulcers. GI: Abdomen soft and nontender.  No hepatomegaly. Vascular: No leg edema. Neuro: Vibratory sense with mild decrease over the fingertips bilaterally per tuning fork exam. Skin: Palms dry appearing, mild erythema. Port-A-Cath without erythema.   Lab Results:  Lab Results  Component Value Date   WBC 4.7 07/25/2019   HGB 14.4 07/25/2019   HCT 42.6 07/25/2019   MCV 99.8 07/25/2019   PLT 52 (L) 07/25/2019   NEUTROABS 3.4 07/25/2019    Imaging:  No results found.  Medications: I have reviewed the patient's current medications.  Assessment/Plan: 1. 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 Mass felt to be in the transverse colon on a colonoscopy 01/07/2019 with a biopsy confirming poorly differentiated adenocarcinoma with signet ring cell features, mismatch repair protein expression normal ? CT abdomen/pelvis 01/08/2019 12.3 cm indeterminate splenic mass, present in 2015 on a lumbar MRI-felt to be benign, small subpleural and pleural-based nodules at the lung bases, status post left nephrectomy ? CT chest 01/20/2019-small bilateral pulmonary nodules with rounded parenchymal  nodules in the right lower lobe, indeterminate splenic mass ? CT chest 03/18/2019-multiple subcentimeter pulmonary nodules again seen bilaterally, greatest in the lower lobes, without significant change. No new or enlarging pulmonary nodules or masses. Partially visualized large heterogeneous splenic mass with no significant change. Plan for follow-up chest CT at a 58-monthinterval. ? Cycle 1 FOLFOX 03/21/2019 ? Cycle 2 FOLFOX 04/04/2019 ? Cycle 3 FOLFOX 04/18/2019 (oxaliplatin dose reduced secondary to thrombocytopenia) ? Cycle 4 FOLFOX 05/02/2019 (oxaliplatin held secondary to thrombocytopenia) ? Cycle 5 FOLFOX 05/16/2019 ? Cycle 6 FOLFOX 05/30/2019 (oxaliplatin held secondary to thrombocytopenia) ? CT chest 06/08/2019-stable scattered small solid pulmonary nodules  ? Cycle 7 FOLFOX 06/13/2019 ? Cycle 8 FOLFOX 06/27/2019 ? Cycle 9 FOLFOX 07/11/2019 ? Cycle 10 FOLFOX 07/25/2019 (oxaliplatin held due to thrombocytopenia)  2. Multiple colon polyps-ascending colon polyps on the colonoscopy 01/07/2019-not removed 3. Wilms tumor at age 54 status [place] chemotherapy[place]radiation [place]. 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 Lumbar discitis 2015 7. Diabetes 8. Asthma 9. Multiple lipomas 10. Family history of colon cancer  Invitaepanel 2020-POLE VUS 11.Left nephrectomy at age 54 12 Port-A-Cath placement, Dr. GJohney [name] 03/16/2019 13. Thrombocytopenia secondary to chemotherapy-oxaliplatin dose reduced beginning with cycle 3 FOLFOX 14.  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 JGastineauappears stable.  He has completed 9 cycles of FOLFOX.  Plan to proceed with cycle 10 today as scheduled.  Oxaliplatin will be held due to thrombocytopenia.  We reviewed the CBC from today.  He has progressive thrombocytopenia.  Oxaliplatin to be held  as above.  He understands  to contact the office with any bleeding.  He will return for lab, follow-up, cycle 11 in 2 weeks.  He will contact the office in the interim as outlined above or with any other problems.  Plan reviewed with [name].    [name] ANP/GNP-BC   07/25/2019  9:26 AM

## 2019-07-26 ENCOUNTER — Telehealth: Payer: Self-pay | Admitting: Nurse Practitioner

## 2019-07-26 NOTE — Telephone Encounter (Signed)
Scheduled per los. Called and left msg. Mailed printout  °

## 2019-07-27 ENCOUNTER — Inpatient Hospital Stay: Payer: BC Managed Care – PPO

## 2019-07-27 ENCOUNTER — Other Ambulatory Visit: Payer: Self-pay

## 2019-07-27 VITALS — BP 132/68 | HR 72 | Temp 98.2°F | Resp 18

## 2019-07-27 DIAGNOSIS — C184 Malignant neoplasm of transverse colon: Secondary | ICD-10-CM | POA: Diagnosis not present

## 2019-07-27 DIAGNOSIS — C186 Malignant neoplasm of descending colon: Secondary | ICD-10-CM

## 2019-07-27 DIAGNOSIS — Z5111 Encounter for antineoplastic chemotherapy: Secondary | ICD-10-CM | POA: Diagnosis not present

## 2019-07-27 MED ORDER — SODIUM CHLORIDE 0.9% FLUSH
10.0000 mL | INTRAVENOUS | Status: DC | PRN
  Administered 2019-07-27: 09:00:00 10 mL
  Filled 2019-07-27: qty 10

## 2019-07-27 MED ORDER — HEPARIN SOD (PORK) LOCK FLUSH 100 UNIT/ML IV SOLN
500.0000 [IU] | Freq: Once | INTRAVENOUS | Status: AC | PRN
  Administered 2019-07-27: 500 [IU]
  Filled 2019-07-27: qty 5

## 2019-08-02 NOTE — Progress Notes (Signed)
Pharmacist Chemotherapy Monitoring - Follow Up Assessment    I verify that I have reviewed each item in the below checklist:  . Regimen for the patient is scheduled for the appropriate day and plan matches scheduled date. Marland Kitchen Appropriate non-routine labs are ordered dependent on drug ordered. . If applicable, additional medications reviewed and ordered per protocol based on lifetime cumulative doses and/or treatment regimen.   Plan for follow-up and/or issues identified: No . I-vent associated with next due treatment: No . MD and/or nursing notified: No  Shalyn Koral K 08/02/2019 11:18 AM

## 2019-08-06 ENCOUNTER — Other Ambulatory Visit: Payer: Self-pay | Admitting: Oncology

## 2019-08-08 ENCOUNTER — Inpatient Hospital Stay: Payer: BC Managed Care – PPO

## 2019-08-08 ENCOUNTER — Other Ambulatory Visit: Payer: Self-pay

## 2019-08-08 ENCOUNTER — Inpatient Hospital Stay: Payer: BC Managed Care – PPO | Attending: Genetic Counselor | Admitting: Oncology

## 2019-08-08 VITALS — BP 136/92 | HR 87 | Temp 98.3°F | Resp 17 | Ht 72.0 in | Wt 193.8 lb

## 2019-08-08 DIAGNOSIS — C182 Malignant neoplasm of ascending colon: Secondary | ICD-10-CM

## 2019-08-08 DIAGNOSIS — C186 Malignant neoplasm of descending colon: Secondary | ICD-10-CM

## 2019-08-08 DIAGNOSIS — C184 Malignant neoplasm of transverse colon: Secondary | ICD-10-CM | POA: Insufficient documentation

## 2019-08-08 DIAGNOSIS — Z5111 Encounter for antineoplastic chemotherapy: Secondary | ICD-10-CM | POA: Insufficient documentation

## 2019-08-08 LAB — CMP (CANCER CENTER ONLY)
ALT: 53 U/L — ABNORMAL HIGH (ref 0–44)
AST: 46 U/L — ABNORMAL HIGH (ref 15–41)
Albumin: 3.2 g/dL — ABNORMAL LOW (ref 3.5–5.0)
Alkaline Phosphatase: 89 U/L (ref 38–126)
Anion gap: 10 (ref 5–15)
BUN: 12 mg/dL (ref 6–20)
CO2: 22 mmol/L (ref 22–32)
Calcium: 8.8 mg/dL — ABNORMAL LOW (ref 8.9–10.3)
Chloride: 105 mmol/L (ref 98–111)
Creatinine: 0.93 mg/dL (ref 0.61–1.24)
GFR, Est AFR Am: >60 mL/min (ref >60)
GFR, Estimated: >60 mL/min (ref >60)
Glucose, Bld: 204 mg/dL — ABNORMAL HIGH (ref 70–99)
Potassium: 4.3 mmol/L (ref 3.5–5.1)
Sodium: 137 mmol/L (ref 135–145)
Total Bilirubin: 0.8 mg/dL (ref 0.3–1.2)
Total Protein: 6.5 g/dL (ref 6.5–8.1)

## 2019-08-08 LAB — CBC WITH DIFFERENTIAL (CANCER CENTER ONLY)
Abs Immature Granulocytes: 0.01 10*3/uL (ref 0.00–0.07)
Basophils Absolute: 0 10*3/uL (ref 0.0–0.1)
Basophils Relative: 1 %
Eosinophils Absolute: 0.1 10*3/uL (ref 0.0–0.5)
Eosinophils Relative: 4 %
HCT: 42.6 % (ref 39.0–52.0)
Hemoglobin: 14.7 g/dL (ref 13.0–17.0)
Immature Granulocytes: 0 %
Lymphocytes Relative: 23 %
Lymphs Abs: 0.9 10*3/uL (ref 0.7–4.0)
MCH: 34.5 pg — ABNORMAL HIGH (ref 26.0–34.0)
MCHC: 34.5 g/dL (ref 30.0–36.0)
MCV: 100 fL (ref 80.0–100.0)
Monocytes Absolute: 0.6 10*3/uL (ref 0.1–1.0)
Monocytes Relative: 16 %
Neutro Abs: 2.1 10*3/uL (ref 1.7–7.7)
Neutrophils Relative %: 56 %
Platelet Count: 71 10*3/uL — ABNORMAL LOW (ref 150–400)
RBC: 4.26 MIL/uL (ref 4.22–5.81)
RDW: 14 % (ref 11.5–15.5)
WBC Count: 3.7 10*3/uL — ABNORMAL LOW (ref 4.0–10.5)
nRBC: 0 % (ref 0.0–0.2)

## 2019-08-08 MED ORDER — SODIUM CHLORIDE 0.9 % IV SOLN
10.0000 mg | Freq: Once | INTRAVENOUS | Status: AC
  Administered 2019-08-08: 10 mg via INTRAVENOUS
  Filled 2019-08-08: qty 10

## 2019-08-08 MED ORDER — OXALIPLATIN CHEMO INJECTION 100 MG/20ML
65.0000 mg/m2 | Freq: Once | INTRAVENOUS | Status: AC
  Administered 2019-08-08: 135 mg via INTRAVENOUS
  Filled 2019-08-08: qty 27

## 2019-08-08 MED ORDER — DEXTROSE 5 % IV SOLN
Freq: Once | INTRAVENOUS | Status: AC
  Filled 2019-08-08: qty 250

## 2019-08-08 MED ORDER — LEUCOVORIN CALCIUM INJECTION 350 MG
400.0000 mg/m2 | Freq: Once | INTRAVENOUS | Status: AC
  Administered 2019-08-08: 836 mg via INTRAVENOUS
  Filled 2019-08-08: qty 41.8

## 2019-08-08 MED ORDER — FLUOROURACIL CHEMO INJECTION 2.5 GM/50ML
400.0000 mg/m2 | Freq: Once | INTRAVENOUS | Status: AC
  Administered 2019-08-08: 850 mg via INTRAVENOUS
  Filled 2019-08-08: qty 17

## 2019-08-08 MED ORDER — SODIUM CHLORIDE 0.9 % IV SOLN
2400.0000 mg/m2 | INTRAVENOUS | Status: DC
  Administered 2019-08-08: 5000 mg via INTRAVENOUS
  Filled 2019-08-08: qty 100

## 2019-08-08 MED ORDER — PALONOSETRON HCL INJECTION 0.25 MG/5ML
INTRAVENOUS | Status: AC
  Filled 2019-08-08: qty 5

## 2019-08-08 MED ORDER — PALONOSETRON HCL INJECTION 0.25 MG/5ML
0.2500 mg | Freq: Once | INTRAVENOUS | Status: AC
  Administered 2019-08-08: 0.25 mg via INTRAVENOUS

## 2019-08-08 NOTE — Progress Notes (Signed)
Per Dr. Benay Spice: OK to treat w/platelets 71,000

## 2019-08-08 NOTE — Progress Notes (Signed)
[place] OFFICE PROGRESS NOTE   Diagnosis: Colon cancer INTERVAL HISTORY:   [name] returns as scheduled.  He completed a cycle of 5-fluorouracil on 07/25/2019.  No mouth sores, nausea/vomiting, or diarrhea.  He has mild numbness in the fingertips.  This does not interfere with activity.  No other complaint.  Objective:  Vital signs in last 24 hours:  Blood pressure (!) 136/92, pulse 87, temperature 98.3 F (36.8 C), temperature source Temporal, resp. rate 17, height 6' (1.829 m), weight 193 lb 12.8 oz (87.9 kg), SpO2 99 %.    Resp: Lungs clear bilaterally Cardio: Regular rate and rhythm GI: No hepatosplenomegaly, nontender Vascular: No leg edema Neuro: Mild loss of vibratory sense at the fingertips bilaterally Skin: Dryness of the hands  Portacath/PICC-without erythema  Lab Results:  Lab Results  Component Value Date   WBC 3.7 (L) 08/08/2019   HGB 14.7 08/08/2019   HCT 42.6 08/08/2019   MCV 100.0 08/08/2019   PLT 71 (L) 08/08/2019   NEUTROABS 2.1 08/08/2019    CMP  Lab Results  Component Value Date   NA 138 07/25/2019   K 4.4 07/25/2019   CL 102 07/25/2019   CO2 25 07/25/2019   GLUCOSE 388 (H) 07/25/2019   BUN 12 07/25/2019   CREATININE 1.13 07/25/2019   CALCIUM 9.0 07/25/2019   PROT 6.5 07/25/2019   ALBUMIN 3.1 (L) 07/25/2019   AST 44 (H) 07/25/2019   ALT 54 (H) 07/25/2019   ALKPHOS 85 07/25/2019   BILITOT 0.9 07/25/2019   GFRNONAA >60 07/25/2019   GFRAA >60 07/25/2019     Medications: I have reviewed the patient's current medications.   Assessment/Plan: 1. 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 Mass felt to be in the transverse colon on a colonoscopy 01/07/2019 with a biopsy confirming poorly differentiated adenocarcinoma with signet ring cell features, mismatch repair protein expression  normal ? CT abdomen/pelvis 01/08/2019 12.3 cm indeterminate splenic mass, present in 2015 on a lumbar MRI-felt to be benign, small subpleural and pleural-based nodules at the lung bases, status post left nephrectomy ? CT chest 01/20/2019-small bilateral pulmonary nodules with rounded parenchymal nodules in the right lower lobe, indeterminate splenic mass ? CT chest 03/18/2019-multiple subcentimeter pulmonary nodules again seen bilaterally, greatest in the lower lobes, without significant change. No new or enlarging pulmonary nodules or masses. Partially visualized large heterogeneous splenic mass with no significant change. Plan for follow-up chest CT at a 55-monthinterval. ? Cycle 1 FOLFOX 03/21/2019 ? Cycle 2 FOLFOX 04/04/2019 ? Cycle 3 FOLFOX 04/18/2019 (oxaliplatin dose reduced secondary to thrombocytopenia) ? Cycle 4 FOLFOX 05/02/2019 (oxaliplatin held secondary to thrombocytopenia) ? Cycle 5 FOLFOX 05/16/2019 ? Cycle 6 FOLFOX 05/30/2019 (oxaliplatin held secondary to thrombocytopenia) ? CT chest 06/08/2019-stable scattered small solid pulmonary nodules  ? Cycle 7 FOLFOX 06/13/2019 ? Cycle 8 FOLFOX 06/27/2019 ? Cycle 9 FOLFOX 07/11/2019 ? Cycle 10 FOLFOX 07/25/2019 (oxaliplatin held due to thrombocytopenia) ? Cycle 11 FOLFOX 08/08/2019  2. Multiple colon polyps-ascending colon polyps on the colonoscopy 01/07/2019-not removed 3. Wilms tumor at age 54 status post chemotherapy[place]radiation and [place]. 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 Lumbar discitis 2015 7. Diabetes 8. Asthma 9. Multiple lipomas 10. Family history of colon cancer  Invitaepanel 2020-POLE VUS 11.Left nephrectomy at age 54 12 Port-A-Cath placement, Dr. GJohney Maine 03/16/2019 13. Thrombocytopenia secondary  to chemotherapy-oxaliplatin dose reduced beginning with cycle 3 FOLFOX 14.  Oxaliplatin neuropathy, mild loss of vibratory  sense on exam 06/27/2019, 08/08/2019     Disposition: [name] appears stable.  He has mild oxaliplatin neuropathy.  This does not interfere with activity.  The plan is to complete another cycle of FOLFOX today.  He will call for bleeding.  [name] will return for an office visit and the final cycle of adjuvant chemotherapy in 2 weeks.  Betsy Coder, MD  08/08/2019  9:47 AM

## 2019-08-08 NOTE — Patient Instructions (Signed)
Please provide a copy of your Medical Advanced Directive to scan into chart

## 2019-08-08 NOTE — Patient Instructions (Signed)
[facility] Discharge Instructions for Patients Receiving Chemotherapy  Today you received the following chemotherapy agents Oxaliplatin (ELOXATIN), Leucovorin & Flourouracil (ADRUCIL).  To help prevent nausea and vomiting after your treatment, we encourage you to take your nausea medication as prescribed.   If you develop nausea and vomiting that is not controlled by your nausea medication, call the clinic.   BELOW ARE SYMPTOMS THAT SHOULD BE REPORTED IMMEDIATELY:  *FEVER GREATER THAN 100.5 F  *CHILLS WITH OR WITHOUT FEVER  NAUSEA AND VOMITING THAT IS NOT CONTROLLED WITH YOUR NAUSEA MEDICATION  *UNUSUAL SHORTNESS OF BREATH  *UNUSUAL BRUISING OR BLEEDING  TENDERNESS IN MOUTH AND THROAT WITH OR WITHOUT PRESENCE OF ULCERS  *URINARY PROBLEMS  *BOWEL PROBLEMS  UNUSUAL RASH Items with * indicate a potential emergency and should be followed up as soon as possible.  Feel free to call the clinic should you have any questions or concerns. The clinic phone number is [phone].  Please show the CHEMO ALERT CARD at check-in to the Emergency Department and triage nurse.   

## 2019-08-10 ENCOUNTER — Inpatient Hospital Stay: Payer: BC Managed Care – PPO

## 2019-08-10 ENCOUNTER — Other Ambulatory Visit: Payer: Self-pay

## 2019-08-10 VITALS — BP 143/84 | HR 70 | Temp 98.2°F | Resp 18

## 2019-08-10 DIAGNOSIS — C184 Malignant neoplasm of transverse colon: Secondary | ICD-10-CM | POA: Diagnosis not present

## 2019-08-10 DIAGNOSIS — Z5111 Encounter for antineoplastic chemotherapy: Secondary | ICD-10-CM | POA: Diagnosis not present

## 2019-08-10 DIAGNOSIS — C186 Malignant neoplasm of descending colon: Secondary | ICD-10-CM

## 2019-08-10 MED ORDER — HEPARIN SOD (PORK) LOCK FLUSH 100 UNIT/ML IV SOLN
500.0000 [IU] | Freq: Once | INTRAVENOUS | Status: AC | PRN
  Administered 2019-08-10: 500 [IU]
  Filled 2019-08-10: qty 5

## 2019-08-10 MED ORDER — SODIUM CHLORIDE 0.9% FLUSH
10.0000 mL | INTRAVENOUS | Status: DC | PRN
  Administered 2019-08-10: 10 mL
  Filled 2019-08-10: qty 10

## 2019-08-16 NOTE — Progress Notes (Signed)
Pharmacist Chemotherapy Monitoring - Follow Up Assessment    I verify that I have reviewed each item in the below checklist:  . Regimen for the patient is scheduled for the appropriate day and plan matches scheduled date. Marland Kitchen Appropriate non-routine labs are ordered dependent on drug ordered. . If applicable, additional medications reviewed and ordered per protocol based on lifetime cumulative doses and/or treatment regimen.   Plan for follow-up and/or issues identified: No . I-vent associated with next due treatment: No . MD and/or nursing notified: No  [name] D 08/16/2019 11:18 AM

## 2019-08-19 DIAGNOSIS — C186 Malignant neoplasm of descending colon: Secondary | ICD-10-CM | POA: Diagnosis not present

## 2019-08-21 ENCOUNTER — Other Ambulatory Visit: Payer: Self-pay | Admitting: Oncology

## 2019-08-22 ENCOUNTER — Inpatient Hospital Stay: Payer: BC Managed Care – PPO | Admitting: Nurse Practitioner

## 2019-08-22 ENCOUNTER — Other Ambulatory Visit: Payer: Self-pay

## 2019-08-22 ENCOUNTER — Encounter: Payer: Self-pay | Admitting: Nurse Practitioner

## 2019-08-22 ENCOUNTER — Inpatient Hospital Stay: Payer: BC Managed Care – PPO

## 2019-08-22 VITALS — BP 146/95 | HR 84 | Temp 98.1°F | Resp 18 | Ht 72.0 in | Wt 189.9 lb

## 2019-08-22 DIAGNOSIS — C186 Malignant neoplasm of descending colon: Secondary | ICD-10-CM | POA: Diagnosis not present

## 2019-08-22 DIAGNOSIS — Z95828 Presence of other vascular implants and grafts: Secondary | ICD-10-CM

## 2019-08-22 DIAGNOSIS — C184 Malignant neoplasm of transverse colon: Secondary | ICD-10-CM | POA: Diagnosis not present

## 2019-08-22 DIAGNOSIS — Z5111 Encounter for antineoplastic chemotherapy: Secondary | ICD-10-CM | POA: Diagnosis not present

## 2019-08-22 LAB — CMP (CANCER CENTER ONLY)
ALT: 61 U/L — ABNORMAL HIGH (ref 0–44)
AST: 53 U/L — ABNORMAL HIGH (ref 15–41)
Albumin: 3.3 g/dL — ABNORMAL LOW (ref 3.5–5.0)
Alkaline Phosphatase: 89 U/L (ref 38–126)
Anion gap: 10 (ref 5–15)
BUN: 15 mg/dL (ref 6–20)
CO2: 25 mmol/L (ref 22–32)
Calcium: 9.3 mg/dL (ref 8.9–10.3)
Chloride: 104 mmol/L (ref 98–111)
Creatinine: 0.93 mg/dL (ref 0.61–1.24)
GFR, Est AFR Am: >60 mL/min (ref >60)
GFR, Estimated: >60 mL/min (ref >60)
Glucose, Bld: 161 mg/dL — ABNORMAL HIGH (ref 70–99)
Potassium: 4.1 mmol/L (ref 3.5–5.1)
Sodium: 139 mmol/L (ref 135–145)
Total Bilirubin: 1 mg/dL (ref 0.3–1.2)
Total Protein: 6.7 g/dL (ref 6.5–8.1)

## 2019-08-22 LAB — CBC WITH DIFFERENTIAL (CANCER CENTER ONLY)
Abs Immature Granulocytes: 0.01 10*3/uL (ref 0.00–0.07)
Basophils Absolute: 0 10*3/uL (ref 0.0–0.1)
Basophils Relative: 1 %
Eosinophils Absolute: 0.1 10*3/uL (ref 0.0–0.5)
Eosinophils Relative: 2 %
HCT: 42.1 % (ref 39.0–52.0)
Hemoglobin: 14.4 g/dL (ref 13.0–17.0)
Immature Granulocytes: 0 %
Lymphocytes Relative: 17 %
Lymphs Abs: 0.8 10*3/uL (ref 0.7–4.0)
MCH: 34.4 pg — ABNORMAL HIGH (ref 26.0–34.0)
MCHC: 34.2 g/dL (ref 30.0–36.0)
MCV: 100.7 fL — ABNORMAL HIGH (ref 80.0–100.0)
Monocytes Absolute: 0.7 10*3/uL (ref 0.1–1.0)
Monocytes Relative: 14 %
Neutro Abs: 3.2 10*3/uL (ref 1.7–7.7)
Neutrophils Relative %: 66 %
Platelet Count: 69 10*3/uL — ABNORMAL LOW (ref 150–400)
RBC: 4.18 MIL/uL — ABNORMAL LOW (ref 4.22–5.81)
RDW: 14.2 % (ref 11.5–15.5)
WBC Count: 4.8 10*3/uL (ref 4.0–10.5)
nRBC: 0 % (ref 0.0–0.2)

## 2019-08-22 LAB — CEA (IN HOUSE-CHCC): CEA (CHCC-In House): 1.59 ng/mL (ref 0.00–5.00)

## 2019-08-22 MED ORDER — SODIUM CHLORIDE 0.9% FLUSH
10.0000 mL | INTRAVENOUS | Status: DC | PRN
  Administered 2019-08-22: 10 mL
  Filled 2019-08-22: qty 10

## 2019-08-22 MED ORDER — DEXTROSE 5 % IV SOLN
Freq: Once | INTRAVENOUS | Status: AC
  Filled 2019-08-22: qty 250

## 2019-08-22 MED ORDER — LEUCOVORIN CALCIUM INJECTION 350 MG
400.0000 mg/m2 | Freq: Once | INTRAVENOUS | Status: AC
  Administered 2019-08-22: 836 mg via INTRAVENOUS
  Filled 2019-08-22: qty 41.8

## 2019-08-22 MED ORDER — SODIUM CHLORIDE 0.9 % IV SOLN
10.0000 mg | Freq: Once | INTRAVENOUS | Status: AC
  Administered 2019-08-22: 10 mg via INTRAVENOUS
  Filled 2019-08-22: qty 10

## 2019-08-22 MED ORDER — PALONOSETRON HCL INJECTION 0.25 MG/5ML
0.2500 mg | Freq: Once | INTRAVENOUS | Status: AC
Start: 2019-08-22 — End: 2019-08-22
  Administered 2019-08-22: 0.25 mg via INTRAVENOUS

## 2019-08-22 MED ORDER — PALONOSETRON HCL INJECTION 0.25 MG/5ML
INTRAVENOUS | Status: AC
  Filled 2019-08-22: qty 5

## 2019-08-22 MED ORDER — FLUOROURACIL CHEMO INJECTION 2.5 GM/50ML
400.0000 mg/m2 | Freq: Once | INTRAVENOUS | Status: AC
  Administered 2019-08-22: 850 mg via INTRAVENOUS
  Filled 2019-08-22: qty 17

## 2019-08-22 MED ORDER — SODIUM CHLORIDE 0.9 % IV SOLN
2400.0000 mg/m2 | INTRAVENOUS | Status: DC
  Administered 2019-08-22: 5000 mg via INTRAVENOUS
  Filled 2019-08-22: qty 100

## 2019-08-22 MED ORDER — OXALIPLATIN CHEMO INJECTION 100 MG/20ML
65.0000 mg/m2 | Freq: Once | INTRAVENOUS | Status: AC
  Administered 2019-08-22: 135 mg via INTRAVENOUS
  Filled 2019-08-22: qty 27

## 2019-08-22 NOTE — Progress Notes (Signed)
[place] OFFICE PROGRESS NOTE   Diagnosis: Colon cancer  INTERVAL HISTORY:   [name] returns as scheduled.  He completed cycle 11 FOLFOX 08/08/2019.  He denies nausea/vomiting.  No mouth sores.  No diarrhea.  He has intermittent tingling in the fingertips.  No numbness.  Tingling does not interfere with activity.  No bleeding.  Objective:  Vital signs in last 24 hours:  Blood pressure (!) 146/95, pulse 84, temperature 98.1 F (36.7 C), temperature source Temporal, resp. rate 18, height 6' (1.829 m), weight 189 lb 14.4 oz (86.1 kg), SpO2 99 %.    HEENT: No thrush or ulcers. Resp: Lungs clear bilaterally. Cardio: Regular rate and rhythm. GI: Abdomen soft and nontender.  No hepatomegaly. Vascular: No leg edema. Skin: Palms dry appearing. Port-A-Cath without erythema.   Lab Results:  Lab Results  Component Value Date   WBC 4.8 08/22/2019   HGB 14.4 08/22/2019   HCT 42.1 08/22/2019   MCV 100.7 (H) 08/22/2019   PLT 69 (L) 08/22/2019   NEUTROABS 3.2 08/22/2019    Imaging:  No results found.  Medications: I have reviewed the patient's current medications.  Assessment/Plan: 1. 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 Mass felt to be in the transverse colon on a colonoscopy 01/07/2019 with a biopsy confirming poorly differentiated adenocarcinoma with signet ring cell features, mismatch repair protein expression normal ? CT abdomen/pelvis 01/08/2019 12.3 cm indeterminate splenic mass, present in 2015 on a lumbar MRI-felt to be benign, small subpleural and pleural-based nodules at the lung bases, status post left nephrectomy ? CT chest 01/20/2019-small bilateral pulmonary nodules with rounded parenchymal nodules in the right lower lobe, indeterminate splenic mass ? CT chest 03/18/2019-multiple subcentimeter pulmonary  nodules again seen bilaterally, greatest in the lower lobes, without significant change. No new or enlarging pulmonary nodules or masses. Partially visualized large heterogeneous splenic mass with no significant change. Plan for follow-up chest CT at a 9-monthinterval. ? Cycle 1 FOLFOX 03/21/2019 ? Cycle 2 FOLFOX 04/04/2019 ? Cycle 3 FOLFOX 04/18/2019 (oxaliplatin dose reduced secondary to thrombocytopenia) ? Cycle 4 FOLFOX 05/02/2019 (oxaliplatin held secondary to thrombocytopenia) ? Cycle 5 FOLFOX 05/16/2019 ? Cycle 6 FOLFOX 05/30/2019 (oxaliplatin held secondary to thrombocytopenia) ? CT chest 06/08/2019-stable scattered small solid pulmonary nodules ? Cycle 7 FOLFOX 06/13/2019 ? Cycle 8 FOLFOX 06/27/2019 ? Cycle 9 FOLFOX 07/11/2019 ? Cycle 10 FOLFOX 07/25/2019 (oxaliplatin held due to thrombocytopenia) ? Cycle 11 FOLFOX 08/08/2019 ? Cycle 12 FOLFOX 08/22/2019  2. Multiple colon polyps-ascending colon polyps on the colonoscopy 01/07/2019-not removed 3. Wilms tumor at age 54 status post chemotherapy/radiation and a nephrectomy in [place]. 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 Lumbar discitis 2015 7. Diabetes 8. Asthma 9. Multiple lipomas 10. Family history of colon cancer  Invitaepanel 2020-POLE VUS 11.Left nephrectomy at age 54 12 Port-A-Cath placement, Dr. GJohney Maine 03/16/2019 13. 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 JHoosappears stable.  He has completed 11 cycles of FOLFOX.  He continues to tolerate the chemotherapy well.  Plan to proceed with the 12th and final cycle today as scheduled.  We reviewed the CBC from today.  Counts adequate to proceed with treatment.  He has stable moderate thrombocytopenia.  He will contact the office  with  any bleeding.  He will return for a CBC, Port-A-Cath flush and follow-up appointment in approximately 4 weeks.  He will contact the office in the interim as outlined above or with any other problems.    Ned Card ANP/GNP-BC   08/22/2019  11:36 AM

## 2019-08-22 NOTE — Patient Instructions (Signed)
[facility] Discharge Instructions for Patients Receiving Chemotherapy  Today you received the following chemotherapy agents Oxaliplatin, Leucovorin, 5FU  To help prevent nausea and vomiting after your treatment, we encourage you to take your nausea medication as directed   If you develop nausea and vomiting that is not controlled by your nausea medication, call the clinic.   BELOW ARE SYMPTOMS THAT SHOULD BE REPORTED IMMEDIATELY:  *FEVER GREATER THAN 100.5 F  *CHILLS WITH OR WITHOUT FEVER  NAUSEA AND VOMITING THAT IS NOT CONTROLLED WITH YOUR NAUSEA MEDICATION  *UNUSUAL SHORTNESS OF BREATH  *UNUSUAL BRUISING OR BLEEDING  TENDERNESS IN MOUTH AND THROAT WITH OR WITHOUT PRESENCE OF ULCERS  *URINARY PROBLEMS  *BOWEL PROBLEMS  UNUSUAL RASH Items with * indicate a potential emergency and should be followed up as soon as possible.  Feel free to call the clinic should you have any questions or concerns. The clinic phone number is [phone].  Please show the CHEMO ALERT CARD at check-in to the Emergency Department and triage nurse.   

## 2019-08-24 ENCOUNTER — Other Ambulatory Visit: Payer: Self-pay

## 2019-08-24 ENCOUNTER — Inpatient Hospital Stay: Payer: BC Managed Care – PPO

## 2019-08-24 VITALS — BP 128/72 | HR 78 | Temp 98.2°F | Resp 18

## 2019-08-24 DIAGNOSIS — C186 Malignant neoplasm of descending colon: Secondary | ICD-10-CM

## 2019-08-24 MED ORDER — SODIUM CHLORIDE 0.9% FLUSH
10.0000 mL | INTRAVENOUS | Status: DC | PRN
  Filled 2019-08-24: qty 10

## 2019-08-24 MED ORDER — HEPARIN SOD (PORK) LOCK FLUSH 100 UNIT/ML IV SOLN
500.0000 [IU] | Freq: Once | INTRAVENOUS | Status: DC | PRN
  Filled 2019-08-24: qty 5

## 2019-09-20 ENCOUNTER — Inpatient Hospital Stay: Payer: BC Managed Care – PPO | Attending: Genetic Counselor | Admitting: Oncology

## 2019-09-20 ENCOUNTER — Encounter: Payer: Self-pay | Admitting: *Deleted

## 2019-09-20 ENCOUNTER — Ambulatory Visit: Payer: Self-pay | Admitting: Surgery

## 2019-09-20 ENCOUNTER — Inpatient Hospital Stay: Payer: BC Managed Care – PPO

## 2019-09-20 ENCOUNTER — Other Ambulatory Visit: Payer: Self-pay

## 2019-09-20 VITALS — BP 139/91 | HR 71 | Temp 98.4°F | Resp 16 | Ht 72.0 in | Wt 186.8 lb

## 2019-09-20 DIAGNOSIS — Z905 Acquired absence of kidney: Secondary | ICD-10-CM | POA: Diagnosis not present

## 2019-09-20 DIAGNOSIS — Z800 Family history of malignant neoplasm of digestive organs: Secondary | ICD-10-CM | POA: Diagnosis not present

## 2019-09-20 DIAGNOSIS — G620 Drug-induced polyneuropathy: Secondary | ICD-10-CM | POA: Diagnosis not present

## 2019-09-20 DIAGNOSIS — Z923 Personal history of irradiation: Secondary | ICD-10-CM | POA: Insufficient documentation

## 2019-09-20 DIAGNOSIS — C186 Malignant neoplasm of descending colon: Secondary | ICD-10-CM

## 2019-09-20 DIAGNOSIS — Z85528 Personal history of other malignant neoplasm of kidney: Secondary | ICD-10-CM | POA: Insufficient documentation

## 2019-09-20 DIAGNOSIS — J45909 Unspecified asthma, uncomplicated: Secondary | ICD-10-CM | POA: Diagnosis not present

## 2019-09-20 DIAGNOSIS — D6959 Other secondary thrombocytopenia: Secondary | ICD-10-CM | POA: Insufficient documentation

## 2019-09-20 DIAGNOSIS — Z95828 Presence of other vascular implants and grafts: Secondary | ICD-10-CM

## 2019-09-20 DIAGNOSIS — Z85038 Personal history of other malignant neoplasm of large intestine: Secondary | ICD-10-CM | POA: Insufficient documentation

## 2019-09-20 DIAGNOSIS — E119 Type 2 diabetes mellitus without complications: Secondary | ICD-10-CM | POA: Insufficient documentation

## 2019-09-20 DIAGNOSIS — Z9221 Personal history of antineoplastic chemotherapy: Secondary | ICD-10-CM | POA: Diagnosis not present

## 2019-09-20 LAB — CBC WITH DIFFERENTIAL (CANCER CENTER ONLY)
Abs Immature Granulocytes: 0.01 10*3/uL (ref 0.00–0.07)
Basophils Absolute: 0 10*3/uL (ref 0.0–0.1)
Basophils Relative: 1 %
Eosinophils Absolute: 0.2 10*3/uL (ref 0.0–0.5)
Eosinophils Relative: 4 %
HCT: 42.9 % (ref 39.0–52.0)
Hemoglobin: 14.8 g/dL (ref 13.0–17.0)
Immature Granulocytes: 0 %
Lymphocytes Relative: 23 %
Lymphs Abs: 0.8 10*3/uL (ref 0.7–4.0)
MCH: 34.3 pg — ABNORMAL HIGH (ref 26.0–34.0)
MCHC: 34.5 g/dL (ref 30.0–36.0)
MCV: 99.3 fL (ref 80.0–100.0)
Monocytes Absolute: 0.5 10*3/uL (ref 0.1–1.0)
Monocytes Relative: 13 %
Neutro Abs: 2.2 10*3/uL (ref 1.7–7.7)
Neutrophils Relative %: 59 %
Platelet Count: 75 10*3/uL — ABNORMAL LOW (ref 150–400)
RBC: 4.32 MIL/uL (ref 4.22–5.81)
RDW: 12.9 % (ref 11.5–15.5)
WBC Count: 3.7 10*3/uL — ABNORMAL LOW (ref 4.0–10.5)
nRBC: 0 % (ref 0.0–0.2)

## 2019-09-20 MED ORDER — HEPARIN SOD (PORK) LOCK FLUSH 100 UNIT/ML IV SOLN
500.0000 [IU] | Freq: Once | INTRAVENOUS | Status: AC | PRN
  Administered 2019-09-20: 500 [IU]
  Filled 2019-09-20: qty 5

## 2019-09-20 MED ORDER — SODIUM CHLORIDE 0.9% FLUSH
10.0000 mL | INTRAVENOUS | Status: DC | PRN
  Administered 2019-09-20: 10 mL
  Filled 2019-09-20: qty 10

## 2019-09-20 NOTE — Patient Instructions (Signed)

## 2019-09-20 NOTE — Progress Notes (Signed)
[place] OFFICE PROGRESS NOTE   Diagnosis: Colon cancer  INTERVAL HISTORY:   [name] returns for a scheduled visit.  He completed a final cycle of adjuvant chemotherapy on 08/22/2019.  He feels well.  Good appetite.  He is exercising.  He reports mild numbness in the fingers.  This does not interfere with activity.  Objective:  Vital signs in last 24 hours:  Blood pressure (!) 139/91, pulse 71, temperature 98.4 F (36.9 C), temperature source Temporal, resp. rate 16, height 6' (1.829 m), weight 186 lb 12.8 oz (84.7 kg), SpO2 98 %.     Lymphatics: No cervical, supraclavicular, axillary, or inguinal nodes Resp: Lungs clear bilaterally Cardio: Regular rate and rhythm GI: No hepatosplenomegaly Vascular: No leg edema    Portacath/PICC-without erythema  Lab Results:  Lab Results  Component Value Date   WBC 3.7 (L) 09/20/2019   HGB 14.8 09/20/2019   HCT 42.9 09/20/2019   MCV 99.3 09/20/2019   PLT 75 (L) 09/20/2019   NEUTROABS 2.2 09/20/2019    CMP  Lab Results  Component Value Date   NA 139 08/22/2019   K 4.1 08/22/2019   CL 104 08/22/2019   CO2 25 08/22/2019   GLUCOSE 161 (H) 08/22/2019   BUN 15 08/22/2019   CREATININE 0.93 08/22/2019   CALCIUM 9.3 08/22/2019   PROT 6.7 08/22/2019   ALBUMIN 3.3 (L) 08/22/2019   AST 53 (H) 08/22/2019   ALT 61 (H) 08/22/2019   ALKPHOS 89 08/22/2019   BILITOT 1.0 08/22/2019   GFRNONAA >60 08/22/2019   GFRAA >60 08/22/2019    Lab Results  Component Value Date   CEA1 1.59 08/22/2019     Medications: I have reviewed the patient's current medications.   Assessment/Plan: 1. 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 Mass felt to be in the transverse colon on a colonoscopy 01/07/2019 with a biopsy confirming poorly differentiated adenocarcinoma with signet ring cell  features, mismatch repair protein expression normal ? CT abdomen/pelvis 01/08/2019 12.3 cm indeterminate splenic mass, present in 2015 on a lumbar MRI-felt to be benign, small subpleural and pleural-based nodules at the lung bases, status post left nephrectomy ? CT chest 01/20/2019-small bilateral pulmonary nodules with rounded parenchymal nodules in the right lower lobe, indeterminate splenic mass ? CT chest 03/18/2019-multiple subcentimeter pulmonary nodules again seen bilaterally, greatest in the lower lobes, without significant change. No new or enlarging pulmonary nodules or masses. Partially visualized large heterogeneous splenic mass with no significant change. Plan for follow-up chest CT at a 30-monthinterval. ? Cycle 1 FOLFOX 03/21/2019 ? Cycle 2 FOLFOX 04/04/2019 ? Cycle 3 FOLFOX 04/18/2019 (oxaliplatin dose reduced secondary to thrombocytopenia) ? Cycle 4 FOLFOX 05/02/2019 (oxaliplatin held secondary to thrombocytopenia) ? Cycle 5 FOLFOX 05/16/2019 ? Cycle 6 FOLFOX 05/30/2019 (oxaliplatin held secondary to thrombocytopenia) ? CT chest 06/08/2019-stable scattered small solid pulmonary nodules ? Cycle 7 FOLFOX 06/13/2019 ? Cycle 8 FOLFOX 06/27/2019 ? Cycle 9 FOLFOX 07/11/2019 ? Cycle 10 FOLFOX 07/25/2019 (oxaliplatin held due to thrombocytopenia) ? Cycle 11 FOLFOX 08/08/2019 ? Cycle 12 FOLFOX 08/22/2019  2. Multiple colon polyps-ascending colon polyps on the colonoscopy 01/07/2019-not removed 3. Wilms tumor at age 54 status post chemotherapy/radiation and a nephrectomy in [place]. 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 Lumbar discitis 2015 7. Diabetes 8. Asthma 9. Multiple lipomas 10. Family history of colon cancer  Invitaepanel 2020-POLE VUS 11.Left nephrectomy at age 54 12. Port-A-Cath placement, Dr. Johney Maine, 03/16/2019 13. 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 appears well.  He has completed the course of adjuvant chemotherapy.  The platelet count remains mildly decreased.  This should improve over the next several months.  We will refer him to Dr. Johney Maine for removal of the Port-A-Cath.  He will be referred to Dr. Michail Sermon for a surveillance colonoscopy and to follow-up on colon polyps.  [name] will return for an office visit and surveillance CTs in early October.  He will call for bleeding.  Betsy Coder, MD  09/20/2019  9:10 AM

## 2019-09-20 NOTE — Progress Notes (Signed)
Faxed referral orders and chart information w/demographics to [facility] for port removal and [facility] for colonoscopy.

## 2019-09-22 ENCOUNTER — Telehealth: Payer: Self-pay | Admitting: Oncology

## 2019-09-22 NOTE — Telephone Encounter (Signed)
Scheduled appts per 6/22 los. Left voicemail with appt date and time.

## 2019-10-07 ENCOUNTER — Telehealth: Payer: Self-pay | Admitting: *Deleted

## 2019-10-07 NOTE — Telephone Encounter (Signed)
Left VM requesting more information on referral sent to office. Called back and and left VM that referral is for port removal.

## 2019-10-10 DIAGNOSIS — Z01818 Encounter for other preprocedural examination: Secondary | ICD-10-CM | POA: Diagnosis not present

## 2019-10-14 DIAGNOSIS — Z452 Encounter for adjustment and management of vascular access device: Secondary | ICD-10-CM | POA: Diagnosis not present

## 2019-10-14 DIAGNOSIS — C785 Secondary malignant neoplasm of large intestine and rectum: Secondary | ICD-10-CM | POA: Diagnosis not present

## 2019-10-20 DIAGNOSIS — E1169 Type 2 diabetes mellitus with other specified complication: Secondary | ICD-10-CM | POA: Diagnosis not present

## 2019-10-20 DIAGNOSIS — I10 Essential (primary) hypertension: Secondary | ICD-10-CM | POA: Diagnosis not present

## 2019-10-20 DIAGNOSIS — J45909 Unspecified asthma, uncomplicated: Secondary | ICD-10-CM | POA: Diagnosis not present

## 2019-10-20 DIAGNOSIS — E7800 Pure hypercholesterolemia, unspecified: Secondary | ICD-10-CM | POA: Diagnosis not present

## 2019-11-01 ENCOUNTER — Telehealth: Payer: Self-pay | Admitting: Interventional Cardiology

## 2019-11-01 NOTE — Telephone Encounter (Signed)
Pt aware does have an echo scheduled for 01/27/20 at 7:30 am .[name]

## 2019-11-01 NOTE — Telephone Encounter (Signed)
New Message:      Pt said he thought he was supposed to have an Echo before his office visit in October. Would you please check and see if he does please.

## 2019-11-29 ENCOUNTER — Other Ambulatory Visit: Payer: Self-pay | Admitting: Gastroenterology

## 2019-11-29 DIAGNOSIS — Z85038 Personal history of other malignant neoplasm of large intestine: Secondary | ICD-10-CM | POA: Diagnosis not present

## 2019-11-29 DIAGNOSIS — Z86010 Personal history of colonic polyps: Secondary | ICD-10-CM | POA: Diagnosis not present

## 2019-12-28 ENCOUNTER — Other Ambulatory Visit: Payer: Self-pay | Admitting: Gastroenterology

## 2019-12-30 ENCOUNTER — Other Ambulatory Visit (HOSPITAL_COMMUNITY)
Admission: RE | Admit: 2019-12-30 | Discharge: 2019-12-30 | Disposition: A | Discharge status: Home / Self Care | Payer: BC Managed Care – PPO | Source: Ambulatory Visit | Attending: Gastroenterology | Admitting: Gastroenterology

## 2019-12-30 DIAGNOSIS — Z20822 Contact with and (suspected) exposure to covid-19: Secondary | ICD-10-CM | POA: Insufficient documentation

## 2019-12-30 DIAGNOSIS — Z01812 Encounter for preprocedural laboratory examination: Secondary | ICD-10-CM | POA: Insufficient documentation

## 2019-12-30 LAB — SARS CORONAVIRUS 2 (TAT 6-24 HRS): SARS Coronavirus 2: NEGATIVE

## 2020-01-02 NOTE — Anesthesia Preprocedure Evaluation (Addendum)
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 on medications and Pt. on home beta blockers Normal cardiovascular exam+ Valvular Problems/Murmurs (severe MR) MR  Rhythm:Regular Rate:Normal  Last echo 12/2018: 1. Left ventricular ejection fraction, by visual estimation, is 60 to 65%. The left ventricle has normal function. Normal left ventricular size.  There is no left ventricular hypertrophy.  2. Global right ventricle has normal systolic function.The right  ventricular size is normal. No increase in right ventricular wall  thickness.  3. Left atrial size was normal.  4. Right atrial size was normal.  5. Moderate thickening of the mitral valve leaflet(s).  6. The mitral valve is myxomatous. Severe mitral valve regurgitation. No evidence of mitral stenosis.  7. Partially flail leaflet of posterior mitral valve. Mitral  regurgitation is severe, with jet wrapping around left atrium.  8. The tricuspid valve is myxomatous. Tricuspid valve regurgitation is trivial.  9. The aortic valve is tricuspid Aortic valve regurgitation was not visualized by color flow Doppler. Structurally normal aortic valve, with no evidence of sclerosis or stenosis.  10. The pulmonic valve was normal in structure. Pulmonic valve regurgitation is not visualized by color flow Doppler.  11. There is mild dilatation of the  ascending aorta measuring 39 mm.  12. Mildly elevated pulmonary artery systolic pressure.  13. The inferior vena cava is normal in size with greater than 50% respiratory variability, suggesting right atrial pressure of 3 mmHg.   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)  negative genitourinary   Musculoskeletal  (+) Arthritis , Osteoarthritis,    Abdominal   Peds negative pediatric ROS (+)  Hematology negative hematology ROS (+)   Anesthesia Other Findings 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  Reproductive/Obstetrics negative OB ROS                           Anesthesia Physical Anesthesia Plan  ASA: IV  Anesthesia Plan: MAC   Post-op Pain Management:    Induction:   PONV Risk Score and Plan: 2 and Propofol infusion and TIVA  Airway Management Planned: Natural Airway and Simple Face Mask  Additional Equipment: None  Intra-op Plan:   Post-operative Plan:   Informed Consent: I have reviewed the patients History and Physical, chart, labs and discussed the procedure including the risks, benefits and alternatives for the proposed anesthesia with the patient or authorized representative who has indicated his/her understanding and acceptance.       Plan Discussed with: CRNA  Anesthesia  Plan Comments:        Anesthesia Quick Evaluation

## 2020-01-03 ENCOUNTER — Encounter (HOSPITAL_COMMUNITY): Payer: Self-pay | Admitting: Gastroenterology

## 2020-01-03 ENCOUNTER — Other Ambulatory Visit: Payer: Self-pay

## 2020-01-03 ENCOUNTER — Ambulatory Visit (HOSPITAL_COMMUNITY): Payer: BC Managed Care – PPO | Admitting: Anesthesiology

## 2020-01-03 ENCOUNTER — Ambulatory Visit (HOSPITAL_COMMUNITY)
Admission: RE | Admit: 2020-01-03 | Discharge: 2020-01-03 | Disposition: A | Discharge status: Home / Self Care | Payer: BC Managed Care – PPO | Source: Ambulatory Visit | Attending: Gastroenterology | Admitting: Gastroenterology

## 2020-01-03 ENCOUNTER — Encounter (HOSPITAL_COMMUNITY)
Admission: RE | Disposition: A | Discharge status: Home / Self Care | Payer: Self-pay | Source: Ambulatory Visit | Attending: Gastroenterology

## 2020-01-03 DIAGNOSIS — E1169 Type 2 diabetes mellitus with other specified complication: Secondary | ICD-10-CM | POA: Diagnosis not present

## 2020-01-03 DIAGNOSIS — K5140 Inflammatory polyps of colon without complications: Secondary | ICD-10-CM | POA: Diagnosis not present

## 2020-01-03 DIAGNOSIS — J45909 Unspecified asthma, uncomplicated: Secondary | ICD-10-CM | POA: Diagnosis not present

## 2020-01-03 DIAGNOSIS — Z85038 Personal history of other malignant neoplasm of large intestine: Secondary | ICD-10-CM | POA: Insufficient documentation

## 2020-01-03 DIAGNOSIS — D120 Benign neoplasm of cecum: Secondary | ICD-10-CM | POA: Diagnosis not present

## 2020-01-03 DIAGNOSIS — K640 First degree hemorrhoids: Secondary | ICD-10-CM | POA: Diagnosis not present

## 2020-01-03 DIAGNOSIS — Z79899 Other long term (current) drug therapy: Secondary | ICD-10-CM | POA: Diagnosis not present

## 2020-01-03 DIAGNOSIS — E890 Postprocedural hypothyroidism: Secondary | ICD-10-CM | POA: Diagnosis not present

## 2020-01-03 DIAGNOSIS — E785 Hyperlipidemia, unspecified: Secondary | ICD-10-CM | POA: Insufficient documentation

## 2020-01-03 DIAGNOSIS — D125 Benign neoplasm of sigmoid colon: Secondary | ICD-10-CM | POA: Diagnosis not present

## 2020-01-03 DIAGNOSIS — Z85528 Personal history of other malignant neoplasm of kidney: Secondary | ICD-10-CM | POA: Diagnosis not present

## 2020-01-03 DIAGNOSIS — Z9049 Acquired absence of other specified parts of digestive tract: Secondary | ICD-10-CM | POA: Diagnosis not present

## 2020-01-03 DIAGNOSIS — I10 Essential (primary) hypertension: Secondary | ICD-10-CM | POA: Insufficient documentation

## 2020-01-03 DIAGNOSIS — K635 Polyp of colon: Secondary | ICD-10-CM | POA: Diagnosis not present

## 2020-01-03 DIAGNOSIS — D122 Benign neoplasm of ascending colon: Secondary | ICD-10-CM | POA: Diagnosis not present

## 2020-01-03 DIAGNOSIS — I340 Nonrheumatic mitral (valve) insufficiency: Secondary | ICD-10-CM | POA: Insufficient documentation

## 2020-01-03 DIAGNOSIS — Z800 Family history of malignant neoplasm of digestive organs: Secondary | ICD-10-CM | POA: Insufficient documentation

## 2020-01-03 DIAGNOSIS — Z7984 Long term (current) use of oral hypoglycemic drugs: Secondary | ICD-10-CM | POA: Insufficient documentation

## 2020-01-03 DIAGNOSIS — Z1211 Encounter for screening for malignant neoplasm of colon: Secondary | ICD-10-CM | POA: Insufficient documentation

## 2020-01-03 DIAGNOSIS — Z86010 Personal history of colonic polyps: Secondary | ICD-10-CM | POA: Insufficient documentation

## 2020-01-03 DIAGNOSIS — Z7982 Long term (current) use of aspirin: Secondary | ICD-10-CM | POA: Diagnosis not present

## 2020-01-03 DIAGNOSIS — Z980 Intestinal bypass and anastomosis status: Secondary | ICD-10-CM | POA: Diagnosis not present

## 2020-01-03 DIAGNOSIS — D123 Benign neoplasm of transverse colon: Secondary | ICD-10-CM | POA: Insufficient documentation

## 2020-01-03 DIAGNOSIS — R809 Proteinuria, unspecified: Secondary | ICD-10-CM | POA: Diagnosis not present

## 2020-01-03 HISTORY — PX: POLYPECTOMY: SHX5525

## 2020-01-03 HISTORY — PX: BIOPSY: SHX5522

## 2020-01-03 HISTORY — PX: COLONOSCOPY WITH PROPOFOL: SHX5780

## 2020-01-03 LAB — GLUCOSE, CAPILLARY: Glucose-Capillary: 152 mg/dL — ABNORMAL HIGH (ref 70–99)

## 2020-01-03 SURGERY — COLONOSCOPY WITH PROPOFOL
Anesthesia: Monitor Anesthesia Care

## 2020-01-03 MED ORDER — PROPOFOL 10 MG/ML IV BOLUS
INTRAVENOUS | Status: DC | PRN
  Administered 2020-01-03: 30 mg via INTRAVENOUS
  Administered 2020-01-03 (×2): 20 mg via INTRAVENOUS
  Administered 2020-01-03: 30 mg via INTRAVENOUS
  Administered 2020-01-03: 20 mg via INTRAVENOUS
  Administered 2020-01-03: 40 mg via INTRAVENOUS

## 2020-01-03 MED ORDER — PROPOFOL 500 MG/50ML IV EMUL
INTRAVENOUS | Status: DC | PRN
  Administered 2020-01-03: 150 ug/kg/min via INTRAVENOUS

## 2020-01-03 MED ORDER — SODIUM CHLORIDE 0.9 % IV SOLN
2.0000 g | Freq: Once | INTRAVENOUS | Status: AC
  Administered 2020-01-03: 2 g via INTRAVENOUS
  Filled 2020-01-03: qty 2

## 2020-01-03 MED ORDER — ONDANSETRON HCL 4 MG/2ML IJ SOLN
INTRAMUSCULAR | Status: DC | PRN
  Administered 2020-01-03: 4 mg via INTRAVENOUS

## 2020-01-03 MED ORDER — SODIUM CHLORIDE 0.9 % IV SOLN: INTRAVENOUS | Status: DC

## 2020-01-03 MED ORDER — LACTATED RINGERS IV SOLN: INTRAVENOUS | Status: DC

## 2020-01-03 MED ORDER — PROPOFOL 500 MG/50ML IV EMUL
INTRAVENOUS | Status: AC
  Filled 2020-01-03: qty 50

## 2020-01-03 SURGICAL SUPPLY — 21 items

## 2020-01-03 NOTE — Anesthesia Procedure Notes (Signed)
Procedure Name: MAC Date/Time: 01/03/2020 10:39 AM Performed by: Maxwell Caul, CRNA Pre-anesthesia Checklist: Patient identified, Emergency Drugs available, Suction available and Patient being monitored Oxygen Delivery Method: Simple face mask

## 2020-01-03 NOTE — Transfer of Care (Signed)
Immediate Anesthesia Transfer of Care Note  Patient: [name]  Procedure(s) Performed: COLONOSCOPY WITH PROPOFOL (N/A ) POLYPECTOMY BIOPSY HEMOSTASIS CONTROL  Patient Location: PACU and Endoscopy Unit  Anesthesia Type:MAC  Level of Consciousness: awake, alert  and oriented  Airway & Oxygen Therapy: Patient Spontanous Breathing and Patient connected to face mask oxygen  Post-op Assessment: Report given to RN and Post -op Vital signs reviewed and stable  Post vital signs: Reviewed and stable  Last Vitals:  Vitals Value Taken Time  BP    Temp    Pulse 63 01/03/20 1134  Resp 17 01/03/20 1134  SpO2 99 % 01/03/20 1134  Vitals shown include unvalidated device data.  Last Pain:  Vitals:   01/03/20 0909  TempSrc: Oral  PainSc: 0-No pain         Complications: No complications documented.

## 2020-01-03 NOTE — Discharge Instructions (Signed)
No Ibuprofen products for 14 days.

## 2020-01-03 NOTE — Interval H&P Note (Signed)
History and Physical Interval Note:  01/03/2020 10:41 AM  [name]  has presented today for surgery, with the diagnosis of History of colon cancer and colon polyps.  The various methods of treatment have been discussed with the patient and family. After consideration of risks, benefits and other options for treatment, the patient has consented to  Procedure(s): COLONOSCOPY WITH PROPOFOL (N/A) as a surgical intervention.  The patient's history has been reviewed, patient examined, no change in status, stable for surgery.  I have reviewed the patient's chart and labs.  Questions were answered to the patient's satisfaction.     [name]

## 2020-01-03 NOTE — Op Note (Signed)
[facility] Patient Name: [name] Procedure Date: 01/03/2020 MRN: [id number] Attending MD: [name] , MD Date of Birth: [birth year] CSN: [id number] Age: 54 Admit Type: Outpatient Procedure:                Colonoscopy Indications:              High risk colon cancer surveillance: Personal                            history of colon cancer, Last colonoscopy: October                            2020 Providers:                [name], MD, Grace Isaac, RN, Mikey College, RN, William Dalton, Technician Referring MD:              Medicines:                Propofol per Anesthesia, Monitored Anesthesia Care Complications:            No immediate complications. Estimated Blood Loss:     Estimated blood loss: none. Procedure:                Pre-Anesthesia Assessment:                           - Prior to the procedure, a History and Physical                            was performed, and patient medications and                            allergies were reviewed. The patient's tolerance of                            previous anesthesia was also reviewed. The risks                            and benefits of the procedure and the sedation                            options and risks were discussed with the patient.                            All questions were answered, and informed consent                            was obtained. Prior Anticoagulants: The patient has                            taken no previous anticoagulant or antiplatelet  agents. ASA Grade Assessment: IV - A patient with                            severe systemic disease that is a constant threat                            to life. After reviewing the risks and benefits,                            the patient was deemed in satisfactory condition to                            undergo the procedure.                           After  obtaining informed consent, the colonoscope                            was passed under direct vision. Throughout the                            procedure, the patient's blood pressure, pulse, and                            oxygen saturations were monitored continuously. The                            [id number]-[id number] ([id number] Olympus pediatric colonscope                            was introduced through the anus and advanced to the                            the cecum, identified by appendiceal orifice and                            ileocecal valve. The colonoscopy was performed                            without difficulty. The patient tolerated the                            procedure well. The quality of the bowel                            preparation was fair and fair but repeated                            irrigation led to a good and adequate prep. The                            ileocecal valve, appendiceal orifice, and rectum  were photographed. Scope In: 10:51:03 AM Scope Out: 11:26:31 AM Scope Withdrawal Time: 0 hours 32 minutes 13 seconds  Total Procedure Duration: 0 hours 35 minutes 28 seconds  Findings:      The perianal and digital rectal examinations were normal.      A 2 mm polyp was found in the cecum. The polyp was semi-sessile. The       polyp was removed with a cold biopsy forceps. Resection and retrieval       were complete. Estimated blood loss was minimal.      A 3 mm polyp was found in the cecum. The polyp was semi-sessile. The       polyp was removed with a hot snare. Resection was complete, but the       polyp tissue was not retrieved. Estimated blood loss: none.      A 3 mm polyp was found in the ascending colon. The polyp was       semi-sessile. The polyp was removed with a hot snare. Resection and       retrieval were complete. Estimated blood loss: none.      Three sessile and semi-sessile polyps were found in the transverse        colon. The polyps were 4 to 8 mm in size. These polyps were removed with       a hot snare. Resection and retrieval were complete. Estimated blood       loss: none.      Three sessile and semi-sessile polyps were found in the distal sigmoid       colon. The polyps were 5 to 10 mm in size. These polyps were removed       with a hot snare. Resection and retrieval were complete. Estimated blood       loss: none.      Internal hemorrhoids were found during retroflexion. The hemorrhoids       were medium-sized and Grade I (internal hemorrhoids that do not       prolapse).      There was evidence of a prior end-to-end colo-colonic anastomosis in the       descending colon. This was patent. Impression:               - Preparation of the colon was fair.                           - One 2 mm polyp in the cecum, removed with a cold                            biopsy forceps. Resected and retrieved.                           - One 3 mm polyp in the cecum, removed with a hot                            snare. Complete resection. Polyp tissue not                            retrieved.                           - One 3 mm polyp  in the ascending colon, removed                            with a hot snare. Resected and retrieved.                           - Three 4 to 8 mm polyps in the transverse colon,                            removed with a hot snare. Resected and retrieved.                           - Three 5 to 10 mm polyps in the distal sigmoid                            colon, removed with a hot snare. Resected and                            retrieved.                           - Internal hemorrhoids.                           - Patent end-to-end colo-colonic anastomosis. Moderate Sedation:      N/A - MAC procedure Recommendation:           - Patient has a contact number available for                            emergencies. The signs and symptoms of potential                            delayed  complications were discussed with the                            patient. Return to normal activities tomorrow.                            Written discharge instructions were provided to the                            patient.                           - High fiber diet.                           - Await pathology results.                           - No ibuprofen, naproxen, or other non-steroidal                            anti-inflammatory drugs for 2 weeks.                           -  Repeat colonoscopy for surveillance based on                            pathology results. Procedure Code(s):        --- Professional ---                           [id number], Colonoscopy, flexible; with removal of                            tumor(s), polyp(s), or other lesion(s) by snare                            technique                           45380, 59, Colonoscopy, flexible; with biopsy,                            single or multiple Diagnosis Code(s):        --- Professional ---                           T01.601, Personal history of other malignant                            neoplasm of large intestine                           K63.5, Polyp of colon                           K64.0, First degree hemorrhoids                           Z98.0, Intestinal bypass and anastomosis status CPT copyright 2019 [employer]. All rights reserved. The codes documented in this report are preliminary and upon coder review may  be revised to meet current compliance requirements. [name], MD 01/03/2020 11:37:39 AM This report has been signed electronically. Number of Addenda: 0

## 2020-01-03 NOTE — Anesthesia Postprocedure Evaluation (Signed)
Anesthesia Post Note  Patient: [name]  Procedure(s) Performed: COLONOSCOPY WITH PROPOFOL (N/A ) POLYPECTOMY HEMOSTASIS CONTROL     Patient location during evaluation: PACU Anesthesia Type: MAC Level of consciousness: awake and alert Pain management: pain level controlled Vital Signs Assessment: post-procedure vital signs reviewed and stable Respiratory status: spontaneous breathing, nonlabored ventilation and respiratory function stable Cardiovascular status: blood pressure returned to baseline and stable Postop Assessment: no apparent nausea or vomiting Anesthetic complications: no   No complications documented.  Last Vitals:  Vitals:   01/03/20 0909 01/03/20 1135  BP: 131/79 (!) 108/51  Pulse: 70 63  Resp: 18 17  Temp: 36.8 C   SpO2: 99% 99%    Last Pain:  Vitals:   01/03/20 1135  TempSrc:   PainSc: 0-No pain                 Pervis Hocking

## 2020-01-03 NOTE — H&P (Signed)
Date of Initial H&P: 12/28/19  History reviewed, patient examined, no change in status, stable for surgery.

## 2020-01-04 ENCOUNTER — Encounter (HOSPITAL_COMMUNITY): Payer: Self-pay | Admitting: Gastroenterology

## 2020-01-04 LAB — SURGICAL PATHOLOGY

## 2020-01-09 ENCOUNTER — Encounter (HOSPITAL_COMMUNITY): Payer: Self-pay

## 2020-01-09 ENCOUNTER — Inpatient Hospital Stay: Payer: BC Managed Care – PPO | Attending: Genetic Counselor

## 2020-01-09 ENCOUNTER — Other Ambulatory Visit: Payer: Self-pay

## 2020-01-09 ENCOUNTER — Ambulatory Visit (HOSPITAL_COMMUNITY)
Admission: RE | Admit: 2020-01-09 | Discharge: 2020-01-09 | Disposition: A | Discharge status: Home / Self Care | Payer: BC Managed Care – PPO | Source: Ambulatory Visit | Attending: Oncology | Admitting: Oncology

## 2020-01-09 DIAGNOSIS — D6959 Other secondary thrombocytopenia: Secondary | ICD-10-CM | POA: Insufficient documentation

## 2020-01-09 DIAGNOSIS — C186 Malignant neoplasm of descending colon: Secondary | ICD-10-CM | POA: Diagnosis not present

## 2020-01-09 DIAGNOSIS — G620 Drug-induced polyneuropathy: Secondary | ICD-10-CM | POA: Diagnosis not present

## 2020-01-09 DIAGNOSIS — E119 Type 2 diabetes mellitus without complications: Secondary | ICD-10-CM | POA: Insufficient documentation

## 2020-01-09 DIAGNOSIS — K7689 Other specified diseases of liver: Secondary | ICD-10-CM | POA: Diagnosis not present

## 2020-01-09 DIAGNOSIS — Z800 Family history of malignant neoplasm of digestive organs: Secondary | ICD-10-CM | POA: Insufficient documentation

## 2020-01-09 DIAGNOSIS — Z8719 Personal history of other diseases of the digestive system: Secondary | ICD-10-CM | POA: Insufficient documentation

## 2020-01-09 DIAGNOSIS — J479 Bronchiectasis, uncomplicated: Secondary | ICD-10-CM | POA: Diagnosis not present

## 2020-01-09 DIAGNOSIS — E890 Postprocedural hypothyroidism: Secondary | ICD-10-CM | POA: Diagnosis not present

## 2020-01-09 DIAGNOSIS — C189 Malignant neoplasm of colon, unspecified: Secondary | ICD-10-CM | POA: Diagnosis not present

## 2020-01-09 DIAGNOSIS — I700 Atherosclerosis of aorta: Secondary | ICD-10-CM | POA: Diagnosis not present

## 2020-01-09 DIAGNOSIS — Z85850 Personal history of malignant neoplasm of thyroid: Secondary | ICD-10-CM | POA: Diagnosis not present

## 2020-01-09 LAB — CBC WITH DIFFERENTIAL (CANCER CENTER ONLY)
Abs Immature Granulocytes: 0.02 10*3/uL (ref 0.00–0.07)
Basophils Absolute: 0 10*3/uL (ref 0.0–0.1)
Basophils Relative: 1 %
Eosinophils Absolute: 0.2 10*3/uL (ref 0.0–0.5)
Eosinophils Relative: 4 %
HCT: 41.8 % (ref 39.0–52.0)
Hemoglobin: 14.5 g/dL (ref 13.0–17.0)
Immature Granulocytes: 0 %
Lymphocytes Relative: 21 %
Lymphs Abs: 1.1 10*3/uL (ref 0.7–4.0)
MCH: 33 pg (ref 26.0–34.0)
MCHC: 34.7 g/dL (ref 30.0–36.0)
MCV: 95 fL (ref 80.0–100.0)
Monocytes Absolute: 0.5 10*3/uL (ref 0.1–1.0)
Monocytes Relative: 10 %
Neutro Abs: 3.4 10*3/uL (ref 1.7–7.7)
Neutrophils Relative %: 64 %
Platelet Count: 127 10*3/uL — ABNORMAL LOW (ref 150–400)
RBC: 4.4 MIL/uL (ref 4.22–5.81)
RDW: 12.3 % (ref 11.5–15.5)
WBC Count: 5.3 10*3/uL (ref 4.0–10.5)
nRBC: 0 % (ref 0.0–0.2)

## 2020-01-09 LAB — BASIC METABOLIC PANEL - CANCER CENTER ONLY
Anion gap: 6 (ref 5–15)
BUN: 19 mg/dL (ref 6–20)
CO2: 31 mmol/L (ref 22–32)
Calcium: 9.7 mg/dL (ref 8.9–10.3)
Chloride: 101 mmol/L (ref 98–111)
Creatinine: 0.99 mg/dL (ref 0.61–1.24)
GFR, Estimated: >60 mL/min (ref >60)
Glucose, Bld: 168 mg/dL — ABNORMAL HIGH (ref 70–99)
Potassium: 4.5 mmol/L (ref 3.5–5.1)
Sodium: 138 mmol/L (ref 135–145)

## 2020-01-09 LAB — CEA (IN HOUSE-CHCC): CEA (CHCC-In House): 1.17 ng/mL (ref 0.00–5.00)

## 2020-01-09 MED ORDER — IOHEXOL 300 MG/ML  SOLN
100.0000 mL | Freq: Once | INTRAMUSCULAR | Status: AC | PRN
  Administered 2020-01-09: 100 mL via INTRAVENOUS

## 2020-01-10 ENCOUNTER — Inpatient Hospital Stay (HOSPITAL_BASED_OUTPATIENT_CLINIC_OR_DEPARTMENT_OTHER): Payer: BC Managed Care – PPO | Admitting: Oncology

## 2020-01-10 ENCOUNTER — Other Ambulatory Visit: Payer: Self-pay

## 2020-01-10 ENCOUNTER — Telehealth: Payer: Self-pay | Admitting: Oncology

## 2020-01-10 VITALS — BP 118/74 | HR 70 | Temp 97.3°F | Resp 16 | Ht 72.0 in | Wt 188.5 lb

## 2020-01-10 DIAGNOSIS — C186 Malignant neoplasm of descending colon: Secondary | ICD-10-CM | POA: Diagnosis not present

## 2020-01-10 DIAGNOSIS — G620 Drug-induced polyneuropathy: Secondary | ICD-10-CM | POA: Diagnosis not present

## 2020-01-10 DIAGNOSIS — D6959 Other secondary thrombocytopenia: Secondary | ICD-10-CM | POA: Diagnosis not present

## 2020-01-10 DIAGNOSIS — R351 Nocturia: Secondary | ICD-10-CM | POA: Diagnosis not present

## 2020-01-10 DIAGNOSIS — K7689 Other specified diseases of liver: Secondary | ICD-10-CM | POA: Diagnosis not present

## 2020-01-10 DIAGNOSIS — E119 Type 2 diabetes mellitus without complications: Secondary | ICD-10-CM | POA: Diagnosis not present

## 2020-01-10 DIAGNOSIS — Z8719 Personal history of other diseases of the digestive system: Secondary | ICD-10-CM | POA: Diagnosis not present

## 2020-01-10 DIAGNOSIS — Z800 Family history of malignant neoplasm of digestive organs: Secondary | ICD-10-CM | POA: Diagnosis not present

## 2020-01-10 NOTE — Progress Notes (Signed)
[facility] OFFICE PROGRESS NOTE   Diagnosis: Colon cancer  INTERVAL HISTORY:   [name] returns as scheduled.  He feels well.  Good appetite.  Minimal neuropathy symptoms in the fingers.  This does not interfere with activity.  He is working.  He underwent a surveillance colonoscopy with Dr. Michail Sermon.  Objective:  Vital signs in last 24 hours:  Blood pressure 118/74, pulse 70, temperature (!) 97.3 F (36.3 C), temperature source Tympanic, resp. rate 16, height 6' (1.829 m), weight 188 lb 8 oz (85.5 kg), SpO2 99 %.    Lymphatics: No cervical, supraclavicular, axillary, or inguinal nodes Resp: Lungs clear bilaterally Cardio: Regular rate and rhythm GI: No hepatosplenomegaly, no mass, nontender, multiple soft cutaneous masses at the low abdominal wall and groin Vascular: No leg edema   Lab Results:  Lab Results  Component Value Date   WBC 5.3 01/09/2020   HGB 14.5 01/09/2020   HCT 41.8 01/09/2020   MCV 95.0 01/09/2020   PLT 127 (L) 01/09/2020   NEUTROABS 3.4 01/09/2020     Lab Results  Component Value Date   CEA1 1.17 01/09/2020    Imaging:  CT CHEST W CONTRAST  Result Date: 01/09/2020 CLINICAL DATA:  Restaging colon cancer, chemotherapy complete. Prior left nephrectomy for Wilms tumor. History of thyroid cancer, status post partial thyroidectomy. EXAM: CT CHEST, ABDOMEN, AND PELVIS WITH CONTRAST TECHNIQUE: Multidetector CT imaging of the chest, abdomen and pelvis was performed following the standard protocol during bolus administration of intravenous contrast. CONTRAST:  143m OMNIPAQUE IOHEXOL 300 MG/ML  SOLN COMPARISON:  CT chest dated 06/08/2019. CT abdomen/pelvis dated 01/08/2019. FINDINGS: CT CHEST FINDINGS Cardiovascular: Heart is normal in size.  No pericardial effusion. No evidence of thoracic aortic aneurysm. Mild atherosclerotic calcifications of the aortic arch. Mediastinum/Nodes: No suspicious mediastinal lymphadenopathy. Status post right  thyroidectomy. Lungs/Pleura: Again seen are numerous pleural-based/subpleural nodules in the lungs bilaterally, many of which are triangular subpleural lymph nodes, measuring up to 4-5 mm. These are unchanged from October 2020 and can be considered benign at this point, despite the patient's clinical history. No new/suspicious pulmonary nodules. Scattered areas of bronchiectasis in the bilateral upper lobes (for example series 6/image 62). No focal consolidation. No pleural effusion or pneumothorax. Musculoskeletal: No focal osseous lesions. CT ABDOMEN PELVIS FINDINGS Hepatobiliary: Tiny hepatic cysts measuring up to 7 mm in the left hepatic lobe (series 2/image 53). Gallbladder is unremarkable. No intrahepatic or extrahepatic ductal dilatation. Pancreas: Within normal limits. Spleen: Rounded, heterogeneously enhancing central splenic mass measuring 12.1 x 9.4 cm (series 2/image 63), unchanged from priors. This appears to have been present on 2015 lumbar spine MRI, favoring a benign etiology such as splenic hamartoma or hemangioma. Adrenals/Urinary Tract: Bilateral adrenal glands are within normal limits. Status post left nephrectomy. Right kidney is within normal limits.  No hydronephrosis. Thick-walled bladder, although underdistended. Stomach/Bowel: Stomach is within normal limits. No evidence of bowel obstruction. Normal appendix (series 2/image 93). Status post partial colon resection with suture line in the central lower abdomen (series 2/image 34). No colonic wall thickening or mass is evident on CT. Vascular/Lymphatic: No evidence of abdominal aortic aneurysm. Atherosclerotic calcifications of the abdominal aorta and branch vessels. No suspicious abdominopelvic lymphadenopathy. Reproductive: Prostate is grossly unremarkable. Other: No abdominopelvic ascites. Tiny fat containing right inguinal hernia. Musculoskeletal: Scattered fat lobules along the subcutaneous wall bilaterally (for example series 2/image 99  and 110), more conspicuous than on priors, but presumably still benign. Degenerative changes of the lumbar spine. IMPRESSION: Status  post partial colon resection. No findings suspicious for recurrent or metastatic disease. Numerous bilateral pulmonary nodules are unchanged x 12 months, compatible with benign subpleural lymph nodes. Fleischner Society guidelines do not apply, but these are not considered suspicious for metastases, and do not require additional dedicated follow-up. Prior right thyroidectomy and left nephrectomy. 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 Additional ancillary findings as above. Electronically Signed   By: Julian Hy M.D.   On: 01/09/2020 14:43   CT ABDOMEN PELVIS W CONTRAST  Result Date: 01/09/2020 CLINICAL DATA:  Restaging colon cancer, chemotherapy complete. Prior left nephrectomy for Wilms tumor. History of thyroid cancer, status post partial thyroidectomy. EXAM: CT CHEST, ABDOMEN, AND PELVIS WITH CONTRAST TECHNIQUE: Multidetector CT imaging of the chest, abdomen and pelvis was performed following the standard protocol during bolus administration of intravenous contrast. CONTRAST:  139m OMNIPAQUE IOHEXOL 300 MG/ML  SOLN COMPARISON:  CT chest dated 06/08/2019. CT abdomen/pelvis dated 01/08/2019. FINDINGS: CT CHEST FINDINGS Cardiovascular: Heart is normal in size.  No pericardial effusion. No evidence of thoracic aortic aneurysm. Mild atherosclerotic calcifications of the aortic arch. Mediastinum/Nodes: No suspicious mediastinal lymphadenopathy. Status post right thyroidectomy. Lungs/Pleura: Again seen are numerous pleural-based/subpleural nodules in the lungs bilaterally, many of which are triangular subpleural lymph nodes, measuring up to 4-5 mm. These are unchanged from October 2020 and can be considered benign at this point, despite the patient's clinical history. No new/suspicious pulmonary nodules.  Scattered areas of bronchiectasis in the bilateral upper lobes (for example series 6/image 62). No focal consolidation. No pleural effusion or pneumothorax. Musculoskeletal: No focal osseous lesions. CT ABDOMEN PELVIS FINDINGS Hepatobiliary: Tiny hepatic cysts measuring up to 7 mm in the left hepatic lobe (series 2/image 53). Gallbladder is unremarkable. No intrahepatic or extrahepatic ductal dilatation. Pancreas: Within normal limits. Spleen: Rounded, heterogeneously enhancing central splenic mass measuring 12.1 x 9.4 cm (series 2/image 63), unchanged from priors. This appears to have been present on 2015 lumbar spine MRI, favoring a benign etiology such as splenic hamartoma or hemangioma. Adrenals/Urinary Tract: Bilateral adrenal glands are within normal limits. Status post left nephrectomy. Right kidney is within normal limits.  No hydronephrosis. Thick-walled bladder, although underdistended. Stomach/Bowel: Stomach is within normal limits. No evidence of bowel obstruction. Normal appendix (series 2/image 93). Status post partial colon resection with suture line in the central lower abdomen (series 2/image 34). No colonic wall thickening or mass is evident on CT. Vascular/Lymphatic: No evidence of abdominal aortic aneurysm. Atherosclerotic calcifications of the abdominal aorta and branch vessels. No suspicious abdominopelvic lymphadenopathy. Reproductive: Prostate is grossly unremarkable. Other: No abdominopelvic ascites. Tiny fat containing right inguinal hernia. Musculoskeletal: Scattered fat lobules along the subcutaneous wall bilaterally (for example series 2/image 99 and 110), more conspicuous than on priors, but presumably still benign. Degenerative changes of the lumbar spine. IMPRESSION: Status post partial colon resection. No findings suspicious for recurrent or metastatic disease. Numerous bilateral pulmonary nodules are unchanged x 12 months, compatible with benign subpleural lymph nodes. Fleischner  Society guidelines do not apply, but these are not considered suspicious for metastases, and do not require additional dedicated follow-up. Prior right thyroidectomy and left nephrectomy. 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 Additional ancillary findings as above. Electronically Signed   By: SJulian HyM.D.   On: 01/09/2020 14:43    Medications: I have reviewed the patient's current medications.   Assessment/Plan: 1. 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 Mass felt to be in the transverse colon on a colonoscopy 01/07/2019 with a biopsy confirming poorly differentiated adenocarcinoma with signet ring cell features, mismatch repair protein expression normal ? CT abdomen/pelvis 01/08/2019 12.3 cm indeterminate splenic mass, present in 2015 on a lumbar MRI-felt to be benign, small subpleural and pleural-based nodules at the lung bases, status post left nephrectomy ? CT chest 01/20/2019-small bilateral pulmonary nodules with rounded parenchymal nodules in the right lower lobe, indeterminate splenic mass ? CT chest 03/18/2019-multiple subcentimeter pulmonary nodules again seen bilaterally, greatest in the lower lobes, without significant change. No new or enlarging pulmonary nodules or masses. Partially visualized large heterogeneous splenic mass with no significant change. Plan for follow-up chest CT at a 79-monthinterval. ? Cycle 1 FOLFOX 03/21/2019 ? Cycle 2 FOLFOX 04/04/2019 ? Cycle 3 FOLFOX 04/18/2019 (oxaliplatin dose reduced secondary to thrombocytopenia) ? Cycle 4 FOLFOX 05/02/2019 (oxaliplatin held secondary to thrombocytopenia) ? Cycle 5 FOLFOX 05/16/2019 ? Cycle 6 FOLFOX 05/30/2019 (oxaliplatin held secondary to thrombocytopenia) ? CT chest 06/08/2019-stable scattered small solid  pulmonary nodules ? Cycle 7 FOLFOX 06/13/2019 ? Cycle 8 FOLFOX 06/27/2019 ? Cycle 9 FOLFOX 07/11/2019 ? Cycle 10 FOLFOX 07/25/2019 (oxaliplatin held due to thrombocytopenia) ? Cycle 11 FOLFOX 08/08/2019 ? Cycle 12 FOLFOX 08/22/2019 ? CTs 01/09/2020-no evidence of recurrent disease, stable lung nodules favored to represent subpleural lymph nodes-dedicated follow-up not recommended  2. Multiple colon polyps-ascending colon polyps on the colonoscopy 01/07/2019-not removed 3. Wilms tumor at age 54 status post chemotherapy/radiation and [place] neph[place] in [place]. 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 Lumbar discitis 2015 7. Diabetes 8. Asthma 9. Multiple lipomas 10. Family history of colon cancer  Invitaepanel 2020-POLE VUS 11.Left nephrectomy at age 54 12 Port-A-Cath placement, Dr. GJohney Maine 03/16/2019 13. 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 JCottais in remission from colon cancer.  He will return for an office visit and CEA in 6 months.  He plans to obtain an influenza vaccine via his employer.  He will discuss pneumococcal vaccination with Dr. MDoyle Askew  GBetsy Coder MD  01/10/2020  10:23 AM

## 2020-01-10 NOTE — Telephone Encounter (Signed)
Scheduled appointment per 10/12 los. Spoke to patient who is aware of appointment date and time.  

## 2020-01-17 DIAGNOSIS — R972 Elevated prostate specific antigen [PSA]: Secondary | ICD-10-CM | POA: Diagnosis not present

## 2020-01-17 DIAGNOSIS — Z905 Acquired absence of kidney: Secondary | ICD-10-CM | POA: Diagnosis not present

## 2020-01-17 DIAGNOSIS — N500 Atrophy of testis: Secondary | ICD-10-CM | POA: Diagnosis not present

## 2020-01-26 ENCOUNTER — Other Ambulatory Visit: Payer: Self-pay

## 2020-01-26 ENCOUNTER — Ambulatory Visit (HOSPITAL_COMMUNITY): Payer: BC Managed Care – PPO | Attending: Cardiovascular Disease

## 2020-01-26 DIAGNOSIS — I340 Nonrheumatic mitral (valve) insufficiency: Secondary | ICD-10-CM | POA: Insufficient documentation

## 2020-01-26 LAB — ECHOCARDIOGRAM COMPLETE
Area-P 1/2: 2.84 cm2
MV M vel: 5.12 m/s
MV Peak grad: 104.9 mmHg
Radius: 0.6 cm
S' Lateral: 3.5 cm

## 2020-01-27 ENCOUNTER — Encounter: Payer: Self-pay | Admitting: Interventional Cardiology

## 2020-01-27 ENCOUNTER — Ambulatory Visit: Payer: BC Managed Care – PPO | Admitting: Interventional Cardiology

## 2020-01-27 VITALS — BP 132/74 | HR 68 | Ht 72.0 in | Wt 191.6 lb

## 2020-01-27 DIAGNOSIS — E119 Type 2 diabetes mellitus without complications: Secondary | ICD-10-CM

## 2020-01-27 DIAGNOSIS — I10 Essential (primary) hypertension: Secondary | ICD-10-CM | POA: Diagnosis not present

## 2020-01-27 DIAGNOSIS — Z8679 Personal history of other diseases of the circulatory system: Secondary | ICD-10-CM

## 2020-01-27 DIAGNOSIS — I340 Nonrheumatic mitral (valve) insufficiency: Secondary | ICD-10-CM | POA: Diagnosis not present

## 2020-01-27 DIAGNOSIS — Z8249 Family history of ischemic heart disease and other diseases of the circulatory system: Secondary | ICD-10-CM | POA: Diagnosis not present

## 2020-01-27 NOTE — Patient Instructions (Addendum)
°  Medication Instructions:  Your physician recommends that you continue on your current medications as directed. Please refer to the Current Medication list given to you today.  *If you need a refill on your cardiac medications before your next appointment, please call your pharmacy*   Lab Work: None  If you have labs (blood work) drawn today and your tests are completely normal, you will receive your results only by:  [facility] (if you have MyChart) OR  A paper copy in the mail If you have any lab test that is abnormal or we need to change your treatment, we will call you to review the results.   Testing/Procedures: Your physician has requested that you have a TEE (I will call you next week to schedule). During a TEE, sound waves are used to create images of your heart. It provides your doctor with information about the size and shape of your heart and how well your hearts chambers and valves are working. In this test, a transducer is attached to the end of a flexible tube thats guided down your throat and into your esophagus (the tube leading from you mouth to your stomach) to get a more detailed image of your heart. You are not awake for the procedure. Please see the instruction sheet given to you today. For further information please visit [email or web address].   Your physician has requested that you have a cardiac catheterization (I will call you next week to schedule.. Cardiac catheterization is used to diagnose and/or treat various heart conditions. Doctors may recommend this procedure for a number of different reasons. The most common reason is to evaluate chest pain. Chest pain can be a symptom of coronary artery disease (CAD), and cardiac catheterization can show whether plaque is narrowing or blocking your hearts arteries. This procedure is also used to evaluate the valves, as well as measure the blood flow and oxygen levels in different parts of your heart. For further  information please visit [email or web address]. Please follow instruction sheet, as given.   Follow-Up: Based on test results   Other Instructions None

## 2020-01-27 NOTE — Progress Notes (Signed)
Cardiology Office Note   Date:  01/27/2020   ID:  Jakin, Pavao 03/17/1966, MRN [id number]  PCP:  Orpah Melter, MD    No chief complaint on file.  Mitral regurgitation  Wt Readings from Last 3 Encounters:  01/27/20 191 lb 9.6 oz (86.9 kg)  01/10/20 188 lb 8 oz (85.5 kg)  01/03/20 182 lb (82.6 kg)       History of Present Illness: JEDIAH HORGER is a 54 y.o. male  who has a family h/o CAD at a young age.  Wilms tumor, s/p nephrectomy- . He has had HTN for over 10 years. BP is usually controlled on meds. He had lipoma removal and then a skin infection in 2015. He was given an ABx and then he continued to feel poorly. 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 He completed a 6 week course of antibiotics to treat enterococcus in his blood. He was evaluated by cardiac surgery as well. He had moderate mitral regurgitation noted on his echocardiogram in January 2016. He is being managed medically.  Family history includes father with CABG, bilateral CEA. CABG at about 56.He also had an ablation.Mother as low BP and thyroid cancer. Sister has not had problems. Paternal GM with stroke.   In 2/21: "Wears compression stockings when he travels. THis helps with swelling.  He has raced cars competitively.  He may need a knee replacement in the future.    He was diagnosed with colon cancer in 12/2018.  He had successful surgery by Dr. Johney Maine.   He is receiving chemo through June.  At surgery, 8/26 LN were positive. He is tolerating chemo. Has some mild nausea.  BP has been high at the Cancer center. Better at home. "  10/21 echo showed severe MR with some increased size fo LV with normal LV function.  Colon CA in remission per 10/21 note with Dr. Benay Spice.      Past Medical History:  Diagnosis Date  . Allergic rhinitis   . Asthma    animal dander & seasonal asthma  . Colon cancer ([facility]) 01/26/2019    . Diabetes mellitus without complication ([facility])    dx 2010  type 2  . Discitis of lumbosacral region    first started with this ..and rolled onto the endocarditis    stayed a month  . 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 Family history of colon cancer   . Family history of thyroid cancer   . H/O scoliosis   . 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 History of hiatal hernia   . Hyperlipidemia   . Hypertension    Diagnostic exercise tolerance test assessment:04/30/2010 : comments normal -no evidence os ischemia by ST analysis  . lipoma   . Lipoma 01/26/2019  . Mitral regurgitation    Echo 12/2018: EF 60-65, myxomatous mitral valve, severe mitral regurgitation, partial flail leaflet of posterior mitral valve, myxomatous tricuspid valve with trivial TR, ascending aorta mildly dilated (39 mm)  . Pneumonia   . PONV (postoperative nausea and vomiting)   . Solitary kidney   . 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 BIOPSY  01/07/2019   Procedure: BIOPSY;  Surgeon: Wilford Corner, MD;  Location: [facility];  Service: Endoscopy;;  . BIOPSY  01/03/2020   Procedure: BIOPSY;  Surgeon:  Wilford Corner, MD;  Location: [facility];  Service: Endoscopy;;  . COLON SURGERY  03/04/2019   left colon segmental resection  . COLONOSCOPY WITH PROPOFOL N/A 01/07/2019   Procedure: COLONOSCOPY WITH PROPOFOL;  Surgeon: Wilford Corner, MD;  Location: [facility];  Service: Endoscopy;  Laterality: N/A;  . COLONOSCOPY WITH PROPOFOL N/A 01/03/2020   Procedure: COLONOSCOPY WITH PROPOFOL;  Surgeon: Wilford Corner, MD;  Location: [facility];  Service: Endoscopy;  Laterality: N/A;  . INGUINAL HERNIA REPAIR Left 04/23/2016   Procedure: LAPAROSCOPIC  INGUINAL REPAIR;  Surgeon: Clovis Riley, MD;  Location: [facility];  Service: General;  Laterality: Left;  . KNEE SURGERY     arthroscopic left  knee  . LIPOMA EXCISION  2015   x20 [facility]  . POLYPECTOMY  01/03/2020   Procedure: POLYPECTOMY;  Surgeon: Wilford Corner, MD;  Location: [facility];  Service: Endoscopy;;  . PORTACATH PLACEMENT N/A 03/16/2019   Procedure: INSERTION PORT-A-CATH;  Surgeon: Michael Boston, MD;  Location: [facility];  Service: General;  Laterality: N/A;  . SUBMUCOSAL INJECTION  01/07/2019   Procedure: SUBMUCOSAL INJECTION;  Surgeon: Wilford Corner, MD;  Location: [facility];  Service: Endoscopy;;  . THYROID SURGERY  2010  . TONSILLECTOMY    . TOTAL NEPHRECTOMY Left 1970   Wilms Tumor left kidney excised as an infant     Current Outpatient Medications  Medication Sig Dispense Refill  . acetaminophen (TYLENOL) 500 MG tablet Take 500-1,000 mg by mouth every 6 (six) hours as needed for mild pain or headache.    . albuterol (PROVENTIL HFA;VENTOLIN HFA) 108 (90 BASE) MCG/ACT inhaler Inhale 1 puff into the lungs every 6 (six) hours as needed for wheezing or shortness of breath.    Marland Kitchen atorvastatin (LIPITOR) 10 MG tablet Take 1 tablet (10 mg total) by mouth daily. 30 tablet 1  . calcium carbonate (TUMS - DOSED IN MG ELEMENTAL CALCIUM) 500 MG chewable tablet Chew 500 mg by mouth daily as needed for indigestion or heartburn.     . fluticasone (FLONASE) 50 MCG/ACT nasal spray Place 1 spray into both nostrils daily as needed for allergies or rhinitis.    Marland Kitchen lisinopril (PRINIVIL,ZESTRIL) 2.5 MG tablet Take 1 tablet (2.5 mg total) by mouth daily. 30 tablet 1  . loratadine (CLARITIN) 10 MG tablet Take 10 mg by mouth daily as needed for allergies.    . metFORMIN (GLUCOPHAGE) 1000 MG tablet Take 1,000 mg by mouth 2 (two) times daily.     . mometasone (ASMANEX) 220 MCG/INH inhaler Inhale 1 puff into the lungs daily.    . Omega-3 Fatty Acids (FISH OIL) 1000 MG CAPS Take 1,000 mg by mouth daily.     . ONE TOUCH ULTRA TEST test strip 1 each by Other route as needed (GLUCOSE).     Marland Kitchen pioglitazone (ACTOS) 15 MG tablet Take 15 mg by  mouth daily.    . repaglinide (PRANDIN) 1 MG tablet Take 1 mg by mouth 2 (two) times daily before a meal.     . TOPROL XL 25 MG 24 hr tablet Take 25 mg by mouth daily.      No current facility-administered medications for this visit.    Allergies:   Patient has no known allergies.    Social History:  The patient  reports that he has never smoked. He has never used smokeless tobacco. He reports current alcohol use of about 1.0 standard drink of alcohol per week. He reports that he does not use drugs.   Family  History:  The patient's family history includes Cancer in his mother; Colon cancer in an other family member; Colon cancer (age of onset: 11) in his maternal aunt; Colon cancer (age of onset: 29) in an other family member; Congestive Heart Failure in his maternal grandfather, maternal grandmother, and paternal uncle; Healthy in his sister; Heart attack in his maternal grandfather and paternal grandfather; Heart disease in his father; Hernia in his father; Hypotension in his mother; Stroke in his paternal grandmother.    ROS:  Please see the history of present illness.   Otherwise, review of systems are positive for knee pain.   All other systems are reviewed and negative.    PHYSICAL EXAM: VS:  BP 132/74   Pulse 68   Ht 6' (1.829 m)   Wt 191 lb 9.6 oz (86.9 kg)   SpO2 98%   BMI 25.99 kg/m  , BMI Body mass index is 25.99 kg/m. GEN: Well nourished, well developed, in no acute distress  HEENT: normal  Neck: no JVD, carotid bruits, or masses Cardiac: RRR; 3/6 late systolic murmur, norubs, or gallops,no edema  Respiratory:  clear to auscultation bilaterally, normal work of breathing GI: soft, nontender, nondistended, + BS MS: no deformity or atrophy  Skin: warm and dry, no rash Neuro:  Strength and sensation are intact Psych: euthymic mood, full affect   EKG:   The ekg ordered today demonstrates    Recent Labs: 03/05/2019: Magnesium 1.6 08/22/2019: ALT 61 01/09/2020: BUN  19; Creatinine 0.99; Hemoglobin 14.5; Platelet Count 127; Potassium 4.5; Sodium 138   Lipid Panel    Component Value Date/Time   CHOL 116 03/05/2014 0630   TRIG 122 03/05/2014 0630   HDL 21 (L) 03/05/2014 0630   CHOLHDL 5.5 03/05/2014 0630   VLDL 24 03/05/2014 0630   LDLCALC 71 03/05/2014 0630     Other studies Reviewed: Additional studies/ records that were reviewed today with results demonstrating: echo results reviewed.   ASSESSMENT AND PLAN:  1. Mitral regurgitation: h/o endocarditis.  Flail lealet note on echo with severe eccentric MR.  At this time, LV has increased from 5.0 cm to 5.4 cm in the past year.  Recommend TEE and R/L heart cath in preparation for surgery.  He is agreeable.  All questions about the procedures answered.  He had TEE in 2015.  Avoid heavy weight lifting and straining.  2. In remission from his recent colon cancer diagnosis and treatment.   3. Family h/o CAD: Look for CAD with cath.   4. DM: Avoiding concentrated sweets.  Healthy diet.  5. HTN: The current medical regimen is effective;  continue present plan and medications.  He requested cath TEE week of 11/8.  Will schedule appt with Dr. Roxy Manns the following week.      Current medicines are reviewed at length with the patient today.  The patient concerns regarding his medicines were addressed.  The following changes have been made:  No change  Labs/ tests ordered today include:  No orders of the defined types were placed in this encounter.   Recommend 150 minutes/week of aerobic exercise Low fat, low carb, high fiber diet recommended  Disposition:   FU in post valve repair   Signed, Larae Grooms, MD  01/27/2020 4:59 PM    [facility] [place], [place], [place]  [address] Phone: [phone]; Fax: [phone]

## 2020-01-27 NOTE — H&P (View-Only) (Signed)
Cardiology Office Note   Date:  01/27/2020   ID:  [name], [name] [name], MRN [id number]  PCP:  Orpah Melter, MD    No chief complaint on file.  Mitral regurgitation  Wt Readings from Last 3 Encounters:  01/27/20 191 lb 9.6 oz (86.9 kg)  01/10/20 188 lb 8 oz (85.5 kg)  01/03/20 182 lb (82.6 kg)       History of Present Illness: [name] is a 54 y.o. male  who has a family h/o CAD at a young age.  Wilms tumor, s/p nephrectomy- . He has had HTN for over 10 years. BP is usually controlled on meds. He had lipoma removal and then a skin infection in 2015. He was given an ABx and then he continued to feel poorly. 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 He completed a 6 week course of antibiotics to treat enterococcus in his blood. He was evaluated by cardiac surgery as well. He had moderate mitral regurgitation noted on his echocardiogram in January 2016. He is being managed medically.  Family history includes father with CABG, bilateral CEA. CABG at about 45.He also had an ablation.Mother as low BP and thyroid cancer. Sister has not had problems. Paternal GM with stroke.   In 2/21: "Wears compression stockings when he travels. THis helps with swelling.  He has raced cars competitively.  He David need a knee replacement in the future.    He was diagnosed with colon cancer in 12/2018.  He had successful surgery by Dr. Johney Maine.   He is receiving chemo through June.  At surgery, 8/26 LN were positive. He is tolerating chemo. Has some mild nausea.  BP has been high at the Cancer center. Better at home. "  10/21 echo showed severe MR with some increased size fo LV with normal LV function.  Colon CA in remission per 10/21 note with Dr. Benay Spice.      Past Medical History:  Diagnosis Date  . Allergic rhinitis   . Asthma    animal dander & seasonal asthma  . Colon cancer ([place]) 01/26/2019    . Diabetes mellitus without complication ([place])    dx 2010  type 2  . Discitis of lumbosacral region    first started with this ..and rolled onto the endocarditis    stayed a month  . 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 Family history of colon cancer   . Family history of thyroid cancer   . H/O scoliosis   . 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 History of hiatal hernia   . Hyperlipidemia   . Hypertension    Diagnostic exercise tolerance test assessment:04/30/2010 : comments normal -no evidence os ischemia by ST analysis  . lipoma   . Lipoma 01/26/2019  . Mitral regurgitation    Echo 12/2018: EF 60-65, myxomatous mitral valve, severe mitral regurgitation, partial flail leaflet of posterior mitral valve, myxomatous tricuspid valve with trivial TR, ascending aorta mildly dilated (39 mm)  . Pneumonia   . PONV (postoperative nausea and vomiting)   . Solitary kidney   . 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 BIOPSY  01/07/2019   Procedure: BIOPSY;  Surgeon: Wilford Corner, MD;  Location: [facility];  Service: Endoscopy;;  . BIOPSY  01/03/2020   Procedure: BIOPSY;  Surgeon:  Wilford Corner, MD;  Location: [facility];  Service: Endoscopy;;  . COLON SURGERY  03/04/2019   left colon segmental resection  . COLONOSCOPY WITH PROPOFOL N/A 01/07/2019   Procedure: COLONOSCOPY WITH PROPOFOL;  Surgeon: Wilford Corner, MD;  Location: [facility];  Service: Endoscopy;  Laterality: N/A;  . COLONOSCOPY WITH PROPOFOL N/A 01/03/2020   Procedure: COLONOSCOPY WITH PROPOFOL;  Surgeon: Wilford Corner, MD;  Location: [facility];  Service: Endoscopy;  Laterality: N/A;  . INGUINAL HERNIA REPAIR Left 04/23/2016   Procedure: LAPAROSCOPIC  INGUINAL REPAIR;  Surgeon: Clovis Riley, MD;  Location: [facility];  Service: General;  Laterality: Left;  . KNEE SURGERY     arthroscopic left  knee  . LIPOMA EXCISION  2015   x20 [facility]  . POLYPECTOMY  01/03/2020   Procedure: POLYPECTOMY;  Surgeon: Wilford Corner, MD;  Location: [facility];  Service: Endoscopy;;  . PORTACATH PLACEMENT N/A 03/16/2019   Procedure: INSERTION PORT-A-CATH;  Surgeon: Michael Boston, MD;  Location: [facility];  Service: General;  Laterality: N/A;  . SUBMUCOSAL INJECTION  01/07/2019   Procedure: SUBMUCOSAL INJECTION;  Surgeon: Wilford Corner, MD;  Location: [facility];  Service: Endoscopy;;  . THYROID SURGERY  2010  . TONSILLECTOMY    . TOTAL NEPHRECTOMY Left 1970   Wilms Tumor left kidney excised as an infant     Current Outpatient Medications  Medication Sig Dispense Refill  . acetaminophen (TYLENOL) 500 MG tablet Take 500-1,000 mg by mouth every 6 (six) hours as needed for mild pain or headache.    . albuterol (PROVENTIL HFA;VENTOLIN HFA) 108 (90 BASE) MCG/ACT inhaler Inhale 1 puff into the lungs every 6 (six) hours as needed for wheezing or shortness of breath.    Marland Kitchen atorvastatin (LIPITOR) 10 MG tablet Take 1 tablet (10 mg total) by mouth daily. 30 tablet 1  . calcium carbonate (TUMS - DOSED IN MG ELEMENTAL CALCIUM) 500 MG chewable tablet Chew 500 mg by mouth daily as needed for indigestion or heartburn.     . fluticasone (FLONASE) 50 MCG/ACT nasal spray Place 1 spray into both nostrils daily as needed for allergies or rhinitis.    Marland Kitchen lisinopril (PRINIVIL,ZESTRIL) 2.5 MG tablet Take 1 tablet (2.5 mg total) by mouth daily. 30 tablet 1  . loratadine (CLARITIN) 10 MG tablet Take 10 mg by mouth daily as needed for allergies.    . metFORMIN (GLUCOPHAGE) 1000 MG tablet Take 1,000 mg by mouth 2 (two) times daily.     . mometasone (ASMANEX) 220 MCG/INH inhaler Inhale 1 puff into the lungs daily.    . Omega-3 Fatty Acids (FISH OIL) 1000 MG CAPS Take 1,000 mg by mouth daily.     . ONE TOUCH ULTRA TEST test strip 1 each by Other route as needed (GLUCOSE).     Marland Kitchen pioglitazone (ACTOS) 15 MG tablet Take 15 mg by  mouth daily.    . repaglinide (PRANDIN) 1 MG tablet Take 1 mg by mouth 2 (two) times daily before a meal.     . TOPROL XL 25 MG 24 hr tablet Take 25 mg by mouth daily.      No current facility-administered medications for this visit.    Allergies:   Patient has no known allergies.    Social History:  The patient  reports that he has never smoked. He has never used smokeless tobacco. He reports current alcohol use of about 1.0 standard drink of alcohol per week. He reports that he does not use drugs.   Family  History:  The patient's family history includes Cancer in his mother; Colon cancer in an other family member; Colon cancer (age of onset: 39) in his maternal aunt; Colon cancer (age of onset: 41) in an other family member; Congestive Heart Failure in his maternal grandfather, maternal grandmother, and paternal uncle; Healthy in his sister; Heart attack in his maternal grandfather and paternal grandfather; Heart disease in his father; Hernia in his father; Hypotension in his mother; Stroke in his paternal grandmother.    ROS:  Please see the history of present illness.   Otherwise, review of systems are positive for knee pain.   All other systems are reviewed and negative.    PHYSICAL EXAM: VS:  BP 132/74   Pulse 68   Ht 6' (1.829 m)   Wt 191 lb 9.6 oz (86.9 kg)   SpO2 98%   BMI 25.99 kg/m  , BMI Body mass index is 25.99 kg/m. GEN: Well nourished, well developed, in no acute distress  HEENT: normal  Neck: no JVD, carotid bruits, or masses Cardiac: RRR; 3/6 late systolic murmur, norubs, or gallops,no edema  Respiratory:  clear to auscultation bilaterally, normal work of breathing GI: soft, nontender, nondistended, + BS MS: no deformity or atrophy  Skin: warm and dry, no rash Neuro:  Strength and sensation are intact Psych: euthymic mood, full affect   EKG:   The ekg ordered today demonstrates    Recent Labs: 03/05/2019: Magnesium 1.6 08/22/2019: ALT 61 01/09/2020: BUN  19; Creatinine 0.99; Hemoglobin 14.5; Platelet Count 127; Potassium 4.5; Sodium 138   Lipid Panel    Component Value Date/Time   CHOL 116 03/05/2014 0630   TRIG 122 03/05/2014 0630   HDL 21 (L) 03/05/2014 0630   CHOLHDL 5.5 03/05/2014 0630   VLDL 24 03/05/2014 0630   LDLCALC 71 03/05/2014 0630     Other studies Reviewed: Additional studies/ records that were reviewed today with results demonstrating: echo results reviewed.   ASSESSMENT AND PLAN:  1. Mitral regurgitation: h/o endocarditis.  Flail lealet note on echo with severe eccentric MR.  At this time, LV has increased from 5.0 cm to 5.4 cm in the past year.  Recommend TEE and R/L heart cath in preparation for surgery.  He is agreeable.  All questions about the procedures answered.  He had TEE in 2015.  Avoid heavy weight lifting and straining.  2. In remission from his recent colon cancer diagnosis and treatment.   3. Family h/o CAD: Look for CAD with cath.   4. DM: Avoiding concentrated sweets.  Healthy diet.  5. HTN: The current medical regimen is effective;  continue present plan and medications.  He requested cath TEE week of 11/8.  Will schedule appt with Dr. Roxy Manns the following week.      Current medicines are reviewed at length with the patient today.  The patient concerns regarding his medicines were addressed.  The following changes have been made:  No change  Labs/ tests ordered today include:  No orders of the defined types were placed in this encounter.   Recommend 150 minutes/week of aerobic exercise Low fat, low carb, high fiber diet recommended  Disposition:   FU in post valve repair   Signed, Larae Grooms, MD  01/27/2020 4:59 PM    [facility] [place], [place], [place]  [address] Phone: [phone]; Fax: [phone]

## 2020-01-31 ENCOUNTER — Telehealth: Payer: Self-pay

## 2020-01-31 DIAGNOSIS — I340 Nonrheumatic mitral (valve) insufficiency: Secondary | ICD-10-CM

## 2020-01-31 NOTE — Addendum Note (Signed)
Addended by: Drue Novel I on: 01/31/2020 04:35 PM   Modules accepted: Orders

## 2020-01-31 NOTE — Telephone Encounter (Addendum)
Per Dr. Irish Lack, patient needs TEE and Left and Right Heart Cath. The patient has been scheduled for 11/12. Reviewed instructions below with patient.       [facility] [place], [place] [address] Dept: [phone] Loc: [phone]  [name]  01/31/2020  You are scheduled for a TEE on Friday, November 12 with Dr. [name] at 8:00 AM.  [name] are scheduled for a Cardiac Catheterization on Friday, November 12 with Dr. Larae Grooms at 9:00 AM  1. Please arrive at the [facility] (Main Entrance A) at [facility]: [address] [place], [place] [address] at 7:00 AM (This time is two hours before your procedure to ensure your preparation). Free valet parking service is available.   Special note: Every effort is made to have your procedure done on time. Please understand that emergencies sometimes delay scheduled procedures.  2. Diet: Nothing to eat or drink after midnight.    3. Labs: You will need to have labs (CBC and BMET) and an EKG on 02/08/20 at 11:00 AM.  Due to recent COVID-19 restrictions implemented by our local and state authorities and in an effort to keep both patients and staff as safe as possible, our hospital system requires COVID-19 testing prior to certain scheduled hospital procedures.  Please go to [place]. [place],  [id number] on 02/08/20 at 12:15 PM.  This is a drive up testing site.  You will not need to exit your vehicle.  You will not be billed at the time of testing but may receive a bill later depending on your insurance. You must agree to self-quarantine from the time of your testing until the procedure date on 02/10/20.  This should included staying home with ONLY the people you live with.  Avoid take-out, grocery store shopping or leaving the house for any non-emergent reason.  Failure to have your COVID-19 test done on the  date and time you have been scheduled will result in cancellation of your procedure.  Please call our office at [phone] if you have any questions.   4. Medication instructions in preparation for your procedure:   Contrast Allergy: No  HOLD the following medications the morning of your procedure: .  metFORMIN (GLUCOPHAGE)  .  pioglitazone (ACTOS)  .  repaglinide (PRANDIN)  HOLD metformin (GLUCOPHAGE) for 48 hours after your procedure  On the morning of your procedure, take a baby Aspirin 81 mg and any morning medicines NOT listed above.  You may use sips of water.  5. Plan for one night stay--bring personal belongings. 6. Bring a current list of your medications and current insurance cards. 7. You MUST have a responsible person to drive you home. 8. Someone MUST be with you the first 24 hours after you arrive home or your discharge will be delayed. 9. Please wear clothes that are easy to get on and off and wear slip-on shoes.  Thank you for allowing [place] to care for you!   -- [name]

## 2020-02-08 ENCOUNTER — Ambulatory Visit (INDEPENDENT_AMBULATORY_CARE_PROVIDER_SITE_OTHER): Payer: BC Managed Care – PPO

## 2020-02-08 ENCOUNTER — Other Ambulatory Visit: Payer: BC Managed Care – PPO

## 2020-02-08 ENCOUNTER — Other Ambulatory Visit: Payer: Self-pay

## 2020-02-08 ENCOUNTER — Other Ambulatory Visit (HOSPITAL_COMMUNITY)
Admission: RE | Admit: 2020-02-08 | Discharge: 2020-02-08 | Disposition: A | Discharge status: Home / Self Care | Payer: BC Managed Care – PPO | Source: Ambulatory Visit | Attending: Cardiovascular Disease | Admitting: Cardiovascular Disease

## 2020-02-08 VITALS — BP 120/76 | HR 73 | Ht 72.0 in | Wt 192.0 lb

## 2020-02-08 DIAGNOSIS — I340 Nonrheumatic mitral (valve) insufficiency: Secondary | ICD-10-CM | POA: Diagnosis not present

## 2020-02-08 DIAGNOSIS — Z20822 Contact with and (suspected) exposure to covid-19: Secondary | ICD-10-CM | POA: Insufficient documentation

## 2020-02-08 DIAGNOSIS — Z85038 Personal history of other malignant neoplasm of large intestine: Secondary | ICD-10-CM | POA: Diagnosis not present

## 2020-02-08 DIAGNOSIS — Z79899 Other long term (current) drug therapy: Secondary | ICD-10-CM | POA: Diagnosis not present

## 2020-02-08 DIAGNOSIS — Z8249 Family history of ischemic heart disease and other diseases of the circulatory system: Secondary | ICD-10-CM | POA: Diagnosis not present

## 2020-02-08 DIAGNOSIS — E119 Type 2 diabetes mellitus without complications: Secondary | ICD-10-CM | POA: Diagnosis not present

## 2020-02-08 DIAGNOSIS — Z01812 Encounter for preprocedural laboratory examination: Secondary | ICD-10-CM | POA: Insufficient documentation

## 2020-02-08 DIAGNOSIS — Z7984 Long term (current) use of oral hypoglycemic drugs: Secondary | ICD-10-CM | POA: Diagnosis not present

## 2020-02-08 DIAGNOSIS — I10 Essential (primary) hypertension: Secondary | ICD-10-CM | POA: Diagnosis not present

## 2020-02-08 LAB — SARS CORONAVIRUS 2 (TAT 6-24 HRS): SARS Coronavirus 2: NEGATIVE

## 2020-02-08 LAB — CBC
Hematocrit: 40.9 % (ref 37.5–51.0)
Hemoglobin: 13.9 g/dL (ref 13.0–17.7)
MCH: 32.8 pg (ref 26.6–33.0)
MCHC: 34 g/dL (ref 31.5–35.7)
MCV: 97 fL (ref 79–97)
Platelets: 114 10*3/uL — ABNORMAL LOW (ref 150–450)
RBC: 4.24 x10E6/uL (ref 4.14–5.80)
RDW: 13.5 % (ref 11.6–15.4)
WBC: 5.6 10*3/uL (ref 3.4–10.8)

## 2020-02-08 LAB — BASIC METABOLIC PANEL
BUN/Creatinine Ratio: 28 — ABNORMAL HIGH (ref 9–20)
BUN: 24 mg/dL (ref 6–24)
CO2: 28 mmol/L (ref 20–29)
Calcium: 9.4 mg/dL (ref 8.7–10.2)
Chloride: 100 mmol/L (ref 96–106)
Creatinine, Ser: 0.86 mg/dL (ref 0.76–1.27)
GFR calc Af Amer: 115 mL/min/{1.73_m2} (ref >59)
GFR calc non Af Amer: 100 mL/min/{1.73_m2} (ref >59)
Glucose: 199 mg/dL — ABNORMAL HIGH (ref 65–99)
Potassium: 4.5 mmol/L (ref 3.5–5.2)
Sodium: 136 mmol/L (ref 134–144)

## 2020-02-08 NOTE — Progress Notes (Signed)
1.) Reason for visit: EKG  2.) Name of MD requesting visit: Dr. Irish Lack  3.) H&P: MR  4.) Assessment and plan per MD: Patient arrived to clinic for his pre-procedure EKG. He is scheduled for TEE and L&RHC on 02/10/20 for MVR work-up. Patient is asymptomatic at this time. EKG reviewed with DOD. Will continue with current plan.

## 2020-02-09 ENCOUNTER — Telehealth: Payer: Self-pay | Admitting: *Deleted

## 2020-02-09 ENCOUNTER — Other Ambulatory Visit: Payer: Self-pay | Admitting: Interventional Cardiology

## 2020-02-09 DIAGNOSIS — I340 Nonrheumatic mitral (valve) insufficiency: Secondary | ICD-10-CM

## 2020-02-09 NOTE — Telephone Encounter (Signed)
Pt contacted pre-catheterization scheduled at [facility] for: Friday February 10, 2020 9 AM Verified arrival time and place: [facility]) at: 7 AM/TEE 8 AM   Nothing to eat or drink after midnight prior to procedures, may have sips of water to take medications.  Hold: Actos-AM of procedure Prandin-AM of procedure Metformin-day of procedure and 48 hours post procedure  Except hold medications AM meds can be  taken pre-cath with sips of water including: ASA 81 mg   Confirmed patient has responsible adult to drive home post procedure and be with patient first 24 hours after arriving home: yes  You are allowed ONE visitor in the waiting room during the time you are at the hospital for your procedure. Both you and your visitor must wear a mask once you enter the hospital.       COVID-19 Pre-Screening Questions:   In the past 14 days have you had a new cough, new headache, new nasal congestion, fever (100.4 or greater) unexplained body aches, new sore throat, or sudden loss of taste or sense of smell? no  In the past 14 days have you been around anyone with known Covid 19? no   Reviewed procedure/mask/visitor instructions, COVID-19 questions with patient.

## 2020-02-10 ENCOUNTER — Other Ambulatory Visit: Payer: Self-pay

## 2020-02-10 ENCOUNTER — Ambulatory Visit (HOSPITAL_COMMUNITY): Payer: BC Managed Care – PPO | Admitting: Certified Registered"

## 2020-02-10 ENCOUNTER — Ambulatory Visit (HOSPITAL_COMMUNITY)
Admission: RE | Admit: 2020-02-10 | Discharge: 2020-02-10 | Disposition: A | Discharge status: Home / Self Care | Payer: BC Managed Care – PPO | Attending: Cardiovascular Disease | Admitting: Cardiovascular Disease

## 2020-02-10 ENCOUNTER — Encounter (HOSPITAL_COMMUNITY)
Admission: RE | Disposition: A | Discharge status: Home / Self Care | Payer: BC Managed Care – PPO | Source: Home / Self Care | Attending: Cardiovascular Disease

## 2020-02-10 ENCOUNTER — Ambulatory Visit (HOSPITAL_BASED_OUTPATIENT_CLINIC_OR_DEPARTMENT_OTHER)
Admission: RE | Admit: 2020-02-10 | Discharge: 2020-02-10 | Disposition: A | Discharge status: Home / Self Care | Payer: BC Managed Care – PPO | Source: Home / Self Care | Attending: Interventional Cardiology | Admitting: Interventional Cardiology

## 2020-02-10 ENCOUNTER — Encounter (HOSPITAL_COMMUNITY): Payer: Self-pay | Admitting: Cardiovascular Disease

## 2020-02-10 DIAGNOSIS — I340 Nonrheumatic mitral (valve) insufficiency: Secondary | ICD-10-CM

## 2020-02-10 DIAGNOSIS — Z79899 Other long term (current) drug therapy: Secondary | ICD-10-CM | POA: Insufficient documentation

## 2020-02-10 DIAGNOSIS — Z7984 Long term (current) use of oral hypoglycemic drugs: Secondary | ICD-10-CM | POA: Diagnosis not present

## 2020-02-10 DIAGNOSIS — Z20822 Contact with and (suspected) exposure to covid-19: Secondary | ICD-10-CM | POA: Diagnosis not present

## 2020-02-10 DIAGNOSIS — E119 Type 2 diabetes mellitus without complications: Secondary | ICD-10-CM | POA: Diagnosis not present

## 2020-02-10 DIAGNOSIS — I10 Essential (primary) hypertension: Secondary | ICD-10-CM | POA: Insufficient documentation

## 2020-02-10 DIAGNOSIS — J45909 Unspecified asthma, uncomplicated: Secondary | ICD-10-CM | POA: Diagnosis not present

## 2020-02-10 DIAGNOSIS — Z85038 Personal history of other malignant neoplasm of large intestine: Secondary | ICD-10-CM | POA: Insufficient documentation

## 2020-02-10 DIAGNOSIS — I081 Rheumatic disorders of both mitral and tricuspid valves: Secondary | ICD-10-CM | POA: Diagnosis not present

## 2020-02-10 DIAGNOSIS — Z8249 Family history of ischemic heart disease and other diseases of the circulatory system: Secondary | ICD-10-CM | POA: Diagnosis not present

## 2020-02-10 HISTORY — PX: RIGHT/LEFT HEART CATH AND CORONARY ANGIOGRAPHY: CATH118266

## 2020-02-10 HISTORY — PX: ABDOMINAL AORTOGRAM: CATH118222

## 2020-02-10 HISTORY — PX: TEE WITHOUT CARDIOVERSION: SHX5443

## 2020-02-10 LAB — POCT I-STAT 7, (LYTES, BLD GAS, ICA,H+H)
Acid-Base Excess: 1 mmol/L (ref 0.0–2.0)
Bicarbonate: 26 mmol/L (ref 20.0–28.0)
Calcium, Ion: 1.19 mmol/L (ref 1.15–1.40)
HCT: 39 % (ref 39.0–52.0)
Hemoglobin: 13.3 g/dL (ref 13.0–17.0)
O2 Saturation: 96 %
Potassium: 4.4 mmol/L (ref 3.5–5.1)
Sodium: 140 mmol/L (ref 135–145)
TCO2: 27 mmol/L (ref 22–32)
pCO2 arterial: 40.8 mmHg (ref 32.0–48.0)
pH, Arterial: 7.413 (ref 7.350–7.450)
pO2, Arterial: 84 mmHg (ref 83.0–108.0)

## 2020-02-10 LAB — ECHO TEE
MV M vel: 3.56 m/s
MV Peak grad: 50.7 mmHg
Radius: 1.4 cm

## 2020-02-10 LAB — GLUCOSE, CAPILLARY
Glucose-Capillary: 200 mg/dL — ABNORMAL HIGH (ref 70–99)
Glucose-Capillary: 158 mg/dL — ABNORMAL HIGH (ref 70–99)

## 2020-02-10 LAB — POCT I-STAT EG7
Acid-Base Excess: 2 mmol/L (ref 0.0–2.0)
Bicarbonate: 27.9 mmol/L (ref 20.0–28.0)
Calcium, Ion: 1.24 mmol/L (ref 1.15–1.40)
HCT: 39 % (ref 39.0–52.0)
Hemoglobin: 13.3 g/dL (ref 13.0–17.0)
O2 Saturation: 67 %
Potassium: 4.6 mmol/L (ref 3.5–5.1)
Sodium: 139 mmol/L (ref 135–145)
TCO2: 29 mmol/L (ref 22–32)
pCO2, Ven: 47.3 mmHg (ref 44.0–60.0)
pH, Ven: 7.378 (ref 7.250–7.430)
pO2, Ven: 36 mmHg (ref 32.0–45.0)

## 2020-02-10 SURGERY — ECHOCARDIOGRAM, TRANSESOPHAGEAL
Anesthesia: Monitor Anesthesia Care

## 2020-02-10 SURGERY — RIGHT/LEFT HEART CATH AND CORONARY ANGIOGRAPHY
Anesthesia: LOCAL

## 2020-02-10 MED ORDER — HEPARIN SODIUM (PORCINE) 1000 UNIT/ML IJ SOLN
INTRAMUSCULAR | Status: AC
  Filled 2020-02-10: qty 1

## 2020-02-10 MED ORDER — SODIUM CHLORIDE 0.9% FLUSH: 3.0000 mL | Freq: Two times a day (BID) | INTRAVENOUS | Status: DC

## 2020-02-10 MED ORDER — SODIUM CHLORIDE 0.9 % IV SOLN: 250.0000 mL | INTRAVENOUS | Status: DC | PRN

## 2020-02-10 MED ORDER — SODIUM CHLORIDE 0.9 % IV SOLN: INTRAVENOUS | Status: DC

## 2020-02-10 MED ORDER — SODIUM CHLORIDE 0.9 % WEIGHT BASED INFUSION: 3.0000 mL/kg/h | INTRAVENOUS | Status: AC

## 2020-02-10 MED ORDER — SODIUM CHLORIDE 0.9% FLUSH: 3.0000 mL | INTRAVENOUS | Status: DC | PRN

## 2020-02-10 MED ORDER — ONDANSETRON HCL 4 MG/2ML IJ SOLN: 4.0000 mg | Freq: Four times a day (QID) | INTRAMUSCULAR | Status: DC | PRN

## 2020-02-10 MED ORDER — LABETALOL HCL 5 MG/ML IV SOLN: 10.0000 mg | INTRAVENOUS | Status: DC | PRN

## 2020-02-10 MED ORDER — VERAPAMIL HCL 2.5 MG/ML IV SOLN
INTRAVENOUS | Status: AC
  Filled 2020-02-10: qty 2

## 2020-02-10 MED ORDER — IOHEXOL 350 MG/ML SOLN
INTRAVENOUS | Status: DC | PRN
  Administered 2020-02-10: 130 mL

## 2020-02-10 MED ORDER — HEPARIN SODIUM (PORCINE) 1000 UNIT/ML IJ SOLN
INTRAMUSCULAR | Status: DC | PRN
  Administered 2020-02-10: 4500 [IU] via INTRAVENOUS

## 2020-02-10 MED ORDER — METFORMIN HCL 1000 MG PO TABS
1000.0000 mg | ORAL_TABLET | Freq: Two times a day (BID) | ORAL | Status: DC
Start: 2020-02-12 — End: 2023-07-17

## 2020-02-10 MED ORDER — SODIUM CHLORIDE 0.9 % WEIGHT BASED INFUSION: 1.0000 mL/kg/h | INTRAVENOUS | Status: DC

## 2020-02-10 MED ORDER — ACETAMINOPHEN 325 MG PO TABS: 650.0000 mg | ORAL_TABLET | ORAL | Status: DC | PRN

## 2020-02-10 MED ORDER — MIDAZOLAM HCL 2 MG/2ML IJ SOLN
INTRAMUSCULAR | Status: AC
  Filled 2020-02-10: qty 2

## 2020-02-10 MED ORDER — LIDOCAINE HCL (PF) 1 % IJ SOLN
INTRAMUSCULAR | Status: DC | PRN
  Administered 2020-02-10 (×2): 2 mL

## 2020-02-10 MED ORDER — FENTANYL CITRATE (PF) 100 MCG/2ML IJ SOLN
INTRAMUSCULAR | Status: DC | PRN
  Administered 2020-02-10: 25 ug via INTRAVENOUS

## 2020-02-10 MED ORDER — LIDOCAINE HCL (CARDIAC) PF 100 MG/5ML IV SOSY
PREFILLED_SYRINGE | INTRAVENOUS | Status: DC | PRN
  Administered 2020-02-10: 40 mg via INTRAVENOUS

## 2020-02-10 MED ORDER — MIDAZOLAM HCL 2 MG/2ML IJ SOLN
INTRAMUSCULAR | Status: DC | PRN
  Administered 2020-02-10: 2 mg via INTRAVENOUS

## 2020-02-10 MED ORDER — ONDANSETRON HCL 4 MG/2ML IJ SOLN
INTRAMUSCULAR | Status: DC | PRN
  Administered 2020-02-10: 4 mg via INTRAVENOUS

## 2020-02-10 MED ORDER — HEPARIN (PORCINE) IN NACL 1000-0.9 UT/500ML-% IV SOLN
INTRAVENOUS | Status: DC | PRN
  Administered 2020-02-10 (×2): 500 mL

## 2020-02-10 MED ORDER — BUTAMBEN-TETRACAINE-BENZOCAINE 2-2-14 % EX AERO
INHALATION_SPRAY | CUTANEOUS | Status: DC | PRN
  Administered 2020-02-10: 2 via TOPICAL

## 2020-02-10 MED ORDER — ASPIRIN 81 MG PO CHEW: 81.0000 mg | CHEWABLE_TABLET | ORAL | Status: DC

## 2020-02-10 MED ORDER — LIDOCAINE HCL (PF) 1 % IJ SOLN
INTRAMUSCULAR | Status: AC
  Filled 2020-02-10: qty 30

## 2020-02-10 MED ORDER — HYDRALAZINE HCL 20 MG/ML IJ SOLN: 10.0000 mg | INTRAMUSCULAR | Status: DC | PRN

## 2020-02-10 MED ORDER — FENTANYL CITRATE (PF) 100 MCG/2ML IJ SOLN
INTRAMUSCULAR | Status: AC
  Filled 2020-02-10: qty 2

## 2020-02-10 MED ORDER — PROPOFOL 500 MG/50ML IV EMUL
INTRAVENOUS | Status: DC | PRN
  Administered 2020-02-10: 40 mg via INTRAVENOUS
  Administered 2020-02-10: 100 ug/kg/min via INTRAVENOUS

## 2020-02-10 MED ORDER — VERAPAMIL HCL 2.5 MG/ML IV SOLN
INTRAVENOUS | Status: DC | PRN
  Administered 2020-02-10: 10 mL via INTRA_ARTERIAL

## 2020-02-10 MED ORDER — HEPARIN (PORCINE) IN NACL 1000-0.9 UT/500ML-% IV SOLN
INTRAVENOUS | Status: AC
  Filled 2020-02-10: qty 1000

## 2020-02-10 SURGICAL SUPPLY — 13 items
CATH 5FR JL3.5 JR4 ANG PIG MP (CATHETERS) ×2 IMPLANT
CATH BALLN WEDGE 5F 110CM (CATHETERS) ×2 IMPLANT
CATH INFINITI 5FR JL4 (CATHETERS) ×2 IMPLANT
DEVICE RAD COMP TR BAND LRG (VASCULAR PRODUCTS) ×2 IMPLANT
GLIDESHEATH SLEND SS 6F .021 (SHEATH) ×2 IMPLANT
GUIDEWIRE INQWIRE 1.5J.035X260 (WIRE) ×1 IMPLANT
INQWIRE 1.5J .035X260CM (WIRE) ×2
KIT HEART LEFT (KITS) ×2 IMPLANT
PACK CARDIAC CATHETERIZATION (CUSTOM PROCEDURE TRAY) ×2 IMPLANT
SHEATH GLIDE SLENDER 4/5FR (SHEATH) ×2 IMPLANT
SYR MEDRAD MARK 7 150ML (SYRINGE) ×2 IMPLANT
TRANSDUCER W/STOPCOCK (MISCELLANEOUS) ×2 IMPLANT
TUBING CIL FLEX 10 FLL-RA (TUBING) ×2 IMPLANT

## 2020-02-10 NOTE — Progress Notes (Signed)
Patient transported to cath lab 3. Report given to cath lab staff.

## 2020-02-10 NOTE — Interval H&P Note (Signed)
History and Physical Interval Note:  02/10/2020 8:00 AM  [name]  has presented today for surgery, with the diagnosis of MITRAL REGURGITATION.  The various methods of treatment have been discussed with the patient and family. After consideration of risks, benefits and other options for treatment, the patient has consented to  Procedure(s) with comments: TRANSESOPHAGEAL ECHOCARDIOGRAM (TEE) (N/A) - CATH AFTER TEE as a surgical intervention.  The patient's history has been reviewed, patient examined, no change in status, stable for surgery.  I have reviewed the patient's chart and labs.  Questions were answered to the patient's satisfaction.     [name]

## 2020-02-10 NOTE — Anesthesia Preprocedure Evaluation (Addendum)
Anesthesia Evaluation  Patient identified by MRN, date of birth, ID band Patient awake    Reviewed: Allergy & Precautions, H&P , NPO status , Patient's Chart, lab work & pertinent test results  Airway Mallampati: II   Neck ROM: full    Dental  (+) Dental Advisory Given   Pulmonary asthma ,    breath sounds clear to auscultation       Cardiovascular hypertension, + Valvular Problems/Murmurs  Rhythm:regular Rate:Normal     Neuro/Psych    GI/Hepatic   Endo/Other  diabetes  Renal/GU      Musculoskeletal  (+) Arthritis ,   Abdominal   Peds  Hematology   Anesthesia Other Findings   Reproductive/Obstetrics                            Anesthesia Physical Anesthesia Plan  ASA: III  Anesthesia Plan: MAC   Post-op Pain Management:    Induction: Intravenous  PONV Risk Score and Plan: 1 and Propofol infusion  Airway Management Planned: Nasal Cannula  Additional Equipment:   Intra-op Plan:   Post-operative Plan:   Informed Consent: I have reviewed the patients History and Physical, chart, labs and discussed the procedure including the risks, benefits and alternatives for the proposed anesthesia with the patient or authorized representative who has indicated his/her understanding and acceptance.       Plan Discussed with: CRNA, Anesthesiologist and Surgeon  Anesthesia Plan Comments:         Anesthesia Quick Evaluation

## 2020-02-10 NOTE — Discharge Instructions (Signed)
NO METFORMIN FOR 2 DAYS  Radial Site Care  This sheet gives you information about how to care for yourself after your procedure. Your health care provider may also give you more specific instructions. If you have problems or questions, contact your health care provider. What can I expect after the procedure? After the procedure, it is common to have:  Bruising and tenderness at the catheter insertion area. Follow these instructions at home: Medicines  Take over-the-counter and prescription medicines only as told by your health care provider. Insertion site care  Follow instructions from your health care provider about how to take care of your insertion site. Make sure you: ? Wash your hands with soap and water before you change your bandage (dressing). If soap and water are not available, use hand sanitizer. ? Change your dressing as told by your health care provider. ? Leave stitches (sutures), skin glue, or adhesive strips in place. These skin closures may need to stay in place for 2 weeks or longer. If adhesive strip edges start to loosen and curl up, you may trim the loose edges. Do not remove adhesive strips completely unless your health care provider tells you to do that.  Check your insertion site every day for signs of infection. Check for: ? Redness, swelling, or pain. ? Fluid or blood. ? Pus or a bad smell. ? Warmth.  Do not take baths, swim, or use a hot tub until your health care provider approves.  You may shower 24-48 hours after the procedure, or as directed by your health care provider. ? Remove the dressing and gently wash the site with plain soap and water. ? Pat the area dry with a clean towel. ? Do not rub the site. That could cause bleeding.  Do not apply powder or lotion to the site. Activity   For 24 hours after the procedure, or as directed by your health care provider: ? Do not flex or bend the affected arm. ? Do not push or pull heavy objects with  the affected arm. ? Do not drive yourself home from the hospital or clinic. You may drive 24 hours after the procedure unless your health care provider tells you not to. ? Do not operate machinery or power tools.  Do not lift anything that is heavier than 10 lb (4.5 kg), or the limit that you are told, until your health care provider says that it is safe.  Ask your health care provider when it is okay to: ? Return to work or school. ? Resume usual physical activities or sports. ? Resume sexual activity. General instructions  If the catheter site starts to bleed, raise your arm and put firm pressure on the site. If the bleeding does not stop, get help right away. This is a medical emergency.  If you went home on the same day as your procedure, a responsible adult should be with you for the first 24 hours after you arrive home.  Keep all follow-up visits as told by your health care provider. This is important. Contact a health care provider if:  You have a fever.  You have redness, swelling, or yellow drainage around your insertion site. Get help right away if:  You have unusual pain at the radial site.  The catheter insertion area swells very fast.  The insertion area is bleeding, and the bleeding does not stop when you hold steady pressure on the area.  Your arm or hand becomes pale, cool, tingly, or numb. These  symptoms may represent a serious problem that is an emergency. Do not wait to see if the symptoms will go away. Get medical help right away. Call your local emergency services (911 in the [place] Do not drive yourself to the hospital. Summary  After the procedure, it is common to have bruising and tenderness at the site.  Follow instructions from your health care provider about how to take care of your radial site wound. Check the wound every day for signs of infection.  Do not lift anything that is heavier than 10 lb (4.5 kg), or the limit that you are told, until your  health care provider says that it is safe. This information is not intended to replace advice given to you by your health care provider. Make sure you discuss any questions you have with your health care provider. Document Revised: 04/22/2017 Document Reviewed: 04/22/2017 Elsevier Patient Education  2020 [employer].

## 2020-02-10 NOTE — CV Procedure (Addendum)
    Transesophageal Echocardiogram Note  [name] [id number] [birth year]  Procedure: Transesophageal Echocardiogram Indications: mitral regurgitation   Procedure Details Consent: Obtained Time Out: Verified patient identification, verified procedure, site/side was marked, verified correct patient position, special equipment/implants available, Radiology Safety Procedures followed,  medications/allergies/relevent history reviewed, required imaging and test results available.  Performed  Medications:  During this procedure the patient is administered Lidocaine 40 mg IV followed by Propofol drip by Earnest Bailey , CRNA.   Total of 240 mg propofol for sedation.  The patient's heart rate, blood pressure, and oxygen saturation are monitored continuously during the procedure. The period of conscious sedation is  30  minutes, of which I was present face-to-face 100% of this time.  Left Ventrical:  Normal LV function   Mitral Valve: partially flail posterior leaflet ,  Eccentric MR directed anteriorly .   3 D images were obtained No vegetation seen , no abscess seen   Aortic Valve: normal AV   Tricuspid Valve: normal ,  Trace - mild TR   Pulmonic Valve: normal   Left Atrium/ Left atrial appendage: no thrombi .   Atrial septum: no ASD or PFO by color doppler   Aorta: mild calcification    Complications: No apparent complications Patient did tolerate procedure well.   Thayer Headings, Brooke Bonito., MD, [facility] 02/10/2020, 8:44 AM

## 2020-02-10 NOTE — Interval H&P Note (Signed)
Cath Lab Visit (complete for each Cath Lab visit)  Clinical Evaluation Leading to the Procedure:   ACS: No.  Non-ACS:    Anginal Classification: CCS II  Anti-ischemic medical therapy: No Therapy  Non-Invasive Test Results: High-risk stress test findings: cardiac mortality >3%/year  Prior CABG: No previous CABG   Severe MR by TE echo.  Will need to consider intervention.  Planning diagnostic cath only today.     History and Physical Interval Note:  02/10/2020 9:10 AM  Fabiola Backer  has presented today for surgery, with the diagnosis of mitral reg.  The various methods of treatment have been discussed with the patient and family. After consideration of risks, benefits and other options for treatment, the patient has consented to  Procedure(s): RIGHT/LEFT HEART CATH AND CORONARY ANGIOGRAPHY (N/A) as a surgical intervention.  The patient's history has been reviewed, patient examined, no change in status, stable for surgery.  I have reviewed the patient's chart and labs.  Questions were answered to the patient's satisfaction.     Larae Grooms

## 2020-02-10 NOTE — Interval H&P Note (Signed)
History and Physical Interval Note:  02/10/2020 8:06 AM  [name]  has presented today for surgery, with the diagnosis of MITRAL REGURGITATION.  The various methods of treatment have been discussed with the patient and family. After consideration of risks, benefits and other options for treatment, the patient has consented to  Procedure(s) with comments: TRANSESOPHAGEAL ECHOCARDIOGRAM (TEE) (N/A) - CATH AFTER TEE as a surgical intervention.  The patient's history has been reviewed, patient examined, no change in status, stable for surgery.  I have reviewed the patient's chart and labs.  Questions were answered to the patient's satisfaction.     [name]

## 2020-02-10 NOTE — Progress Notes (Signed)
Echocardiogram Echocardiogram Transesophageal has been performed.  Oneal Deputy Brylee Berk 02/10/2020, 9:07 AM

## 2020-02-10 NOTE — Transfer of Care (Signed)
Immediate Anesthesia Transfer of Care Note  Patient: [name]  Procedure(s) Performed: TRANSESOPHAGEAL ECHOCARDIOGRAM (TEE) (N/A )  Patient Location: Endoscopy Unit  Anesthesia Type:MAC  Level of Consciousness: awake, alert  and oriented  Airway & Oxygen Therapy: Patient connected to face mask oxygen  Post-op Assessment: Post -op Vital signs reviewed and stable  Post vital signs: stable  Last Vitals:  Vitals Value Taken Time  BP    Temp    Pulse    Resp    SpO2      Last Pain: There were no vitals filed for this visit.       Complications: No complications documented.

## 2020-02-12 ENCOUNTER — Encounter (HOSPITAL_COMMUNITY): Payer: Self-pay | Admitting: Cardiovascular Disease

## 2020-02-14 NOTE — Anesthesia Postprocedure Evaluation (Signed)
Anesthesia Post Note  Patient: [name]  Procedure(s) Performed: TRANSESOPHAGEAL ECHOCARDIOGRAM (TEE) (N/A )     Patient location during evaluation: Endoscopy Anesthesia Type: MAC Level of consciousness: awake and alert Pain management: pain level controlled Vital Signs Assessment: post-procedure vital signs reviewed and stable Respiratory status: spontaneous breathing, nonlabored ventilation, respiratory function stable and patient connected to nasal cannula oxygen Cardiovascular status: blood pressure returned to baseline and stable Postop Assessment: no apparent nausea or vomiting Anesthetic complications: no   No complications documented.  Last Vitals:  Vitals:   02/10/20 1141 02/10/20 1211  BP: 127/83 129/78  Pulse: 70 68  Resp:    Temp:    SpO2: 98% 99%    Last Pain:  Vitals:   02/10/20 0949  TempSrc:   PainSc: 0-No pain   Pain Goal:                   Lyndsey Demos S

## 2020-02-15 ENCOUNTER — Other Ambulatory Visit: Payer: Self-pay

## 2020-02-15 ENCOUNTER — Institutional Professional Consult (permissible substitution): Payer: BC Managed Care – PPO | Admitting: Thoracic Surgery (Cardiothoracic Vascular Surgery)

## 2020-02-15 ENCOUNTER — Encounter: Payer: Self-pay | Admitting: Thoracic Surgery (Cardiothoracic Vascular Surgery)

## 2020-02-15 ENCOUNTER — Other Ambulatory Visit: Payer: Self-pay | Admitting: *Deleted

## 2020-02-15 VITALS — BP 158/91 | HR 93 | Resp 18 | Ht 72.0 in | Wt 193.0 lb

## 2020-02-15 DIAGNOSIS — I340 Nonrheumatic mitral (valve) insufficiency: Secondary | ICD-10-CM

## 2020-02-15 NOTE — Progress Notes (Addendum)
[name]       [place] [address]             [place] REPORT  Referring Provider is Jettie Booze, MD PCP is Orpah Melter, MD  Chief Complaint  Patient presents with  . 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  Patient surgical excision of a lipoma in 2015 and developed severe skin infection.  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  He was noted to have moderate mitral regurgitation without signs of congestive heart failure and seen in consultation by Dr. Servando Snare at that time.  He was treated medically and did well and has been followed carefully ever since by Dr. Irish Lack.  Approximately 1 year ago he was diagnosed with colon cancer and underwent left colectomy.  Final pathology revealed nodal metastases (stage IIIc, T4N2A moderately differentiated adenocarcinoma) and he was treated with postoperative adjuvant chemotherapy using 5-fluorouracil which he completed in May of this year.  Most recent follow-up CT scan of the abdomen and pelvis performed October 2021 revealed no sign of residual disease.  Routine follow-up transthoracic echocardiogram  performed January 26, 2020 revealed severe mitral regurgitation with normal left ventricular systolic function but slightly enlarged left ventricular chamber size.  The patient was seen in follow-up with Dr. Irish Lack and subsequently underwent TEE and diagnostic cardiac catheterization on February 10, 2020.  TEE confirmed the presence of severe mitral regurgitation with an obvious flail segment involving a portion of the posterior leaflet and an eccentric jet of mitral regurgitation.  Left ventricular function appeared normal with ejection fraction estimated 60 to 65%.  Diagnostic cardiac catheterization revealed normal coronary artery anatomy with no significant coronary artery disease.  Pulmonary artery pressures were normal.  Abdominal aortogram revealed no significant atherosclerotic disease.  Cardiothoracic surgical consultation was requested.  Patient is married and lives locally in [place] with his wife.  He works for [employer].  He remains quite active physically and exercises on a regular basis.  He specifically denies any symptoms of exertional shortness of breath or chest discomfort.  He has not noticed any significant change in his exercise tolerance although he admits that he does not push himself terribly hard when he exercises.  He has not had palpitations, dizzy spells, nor syncope.  Past Medical History:  Diagnosis Date  . Allergic rhinitis   . Asthma    animal dander & seasonal asthma  . Colon cancer ([facility]) 01/26/2019  . Diabetes mellitus without complication ([facility])    dx 2010  type 2  . Discitis of lumbosacral region    first started with this ..and rolled onto the endocarditis    stayed a month  . 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 Family history of colon cancer   .  Family history of thyroid cancer   . H/O scoliosis   . 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 History of hiatal hernia   . Hyperlipidemia   . Hypertension    Diagnostic exercise  tolerance test assessment:04/30/2010 : comments normal -no evidence os ischemia by ST analysis  . lipoma   . Lipoma 01/26/2019  . Mitral regurgitation    Echo 12/2018: EF 60-65, myxomatous mitral valve, severe mitral regurgitation, partial flail leaflet of posterior mitral valve, myxomatous tricuspid valve with trivial TR, ascending aorta mildly dilated (39 mm)  . Pneumonia   . PONV (postoperative nausea and vomiting)   . Solitary kidney   . 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 ABDOMINAL AORTOGRAM N/A 02/10/2020   Procedure: ABDOMINAL AORTOGRAM;  Surgeon: Jettie Booze, MD;  Location: [facility];  Service: Cardiovascular;  Laterality: N/A;  . BIOPSY  01/07/2019   Procedure: BIOPSY;  Surgeon: Wilford Corner, MD;  Location: [facility];  Service: Endoscopy;;  . BIOPSY  01/03/2020   Procedure: BIOPSY;  Surgeon: Wilford Corner, MD;  Location: [facility];  Service: Endoscopy;;  . COLON SURGERY  03/04/2019   left colon segmental resection  . COLONOSCOPY WITH PROPOFOL N/A 01/07/2019   Procedure: COLONOSCOPY WITH PROPOFOL;  Surgeon: Wilford Corner, MD;  Location: [facility];  Service: Endoscopy;  Laterality: N/A;  . COLONOSCOPY WITH PROPOFOL N/A 01/03/2020   Procedure: COLONOSCOPY WITH PROPOFOL;  Surgeon: Wilford Corner, MD;  Location: [facility];  Service: Endoscopy;  Laterality: N/A;  . INGUINAL HERNIA REPAIR Left 04/23/2016   Procedure: LAPAROSCOPIC  INGUINAL REPAIR;  Surgeon: Clovis Riley, MD;  Location: [facility];  Service: General;  Laterality: Left;  . KNEE SURGERY     arthroscopic left knee  . LIPOMA EXCISION  2015   x20 [facility]  . POLYPECTOMY  01/03/2020   Procedure: POLYPECTOMY;  Surgeon: Wilford Corner, MD;  Location: [facility];  Service: Endoscopy;;  . PORTACATH PLACEMENT N/A 03/16/2019   Procedure: INSERTION PORT-A-CATH;  Surgeon: Michael Boston, MD;   Location: [facility];  Service: General;  Laterality: N/A;  . RIGHT/LEFT HEART CATH AND CORONARY ANGIOGRAPHY N/A 02/10/2020   Procedure: RIGHT/LEFT HEART CATH AND CORONARY ANGIOGRAPHY;  Surgeon: Jettie Booze, MD;  Location: [facility];  Service: Cardiovascular;  Laterality: N/A;  . SUBMUCOSAL INJECTION  01/07/2019   Procedure: SUBMUCOSAL INJECTION;  Surgeon: Wilford Corner, MD;  Location: [facility];  Service: Endoscopy;;  . TEE WITHOUT CARDIOVERSION N/A 02/10/2020   Procedure: TRANSESOPHAGEAL ECHOCARDIOGRAM (TEE);  Surgeon: Acie Fredrickson Wonda Cheng, MD;  Location: [facility];  Service: Cardiovascular;  Laterality: N/A;  CATH AFTER TEE  . THYROIDECTOMY, PARTIAL  01/05/2009   Right Thyroid Lobectomy  . TONSILLECTOMY    . TOTAL NEPHRECTOMY Left 1970   Wilms Tumor left kidney excised as an infant    Family History  Problem Relation Age of Onset  . Heart disease Father   . Hernia Father   . Healthy Sister   . Stroke Paternal Grandmother   . Cancer Mother        THYROID  . Hypotension Mother   . Heart attack Maternal Grandfather   . Congestive Heart Failure Maternal Grandfather   . Heart attack Paternal Grandfather   . Colon cancer Maternal Aunt 79  . Congestive Heart Failure Paternal Uncle   . Congestive Heart Failure Maternal Grandmother   . Colon cancer Other 85  MGMs brother  . Colon cancer Other        MGFs brother    Social History   Socioeconomic History  . Marital status: Married    Spouse name: Not on file  . Number of children: Not on file  . Years of education: Not on file  . Highest education level: Not on file  Occupational History  . Occupation: executive  Tobacco Use  . Smoking status: Never Smoker  . Smokeless tobacco: Never Used  Vaping Use  . Vaping Use: Never used  Substance and Sexual Activity  . Alcohol use: Yes    Alcohol/week: 1.0 standard drink    Types: 1 Glasses of wine per week    Comment: occasional  . Drug use: No  .  Sexual activity: Yes  Other Topics Concern  . Not on file  Social History Narrative   Never smoked    Alcohol yes, rare ,1-2 per week   No recreational drugs   Occupation-  Geneticist, molecular and other executive courses   Marital status - married     Children 1 boy         Social Determinants of Radio broadcast assistant Strain:   . Difficulty of Paying Living Expenses: Not on file  Food Insecurity:   . Worried About Charity fundraiser in the Last Year: Not on file  . Ran Out of Food in the Last Year: Not on file  Transportation Needs:   . Lack of Transportation (Medical): Not on file  . Lack of Transportation (Non-Medical): Not on file  Physical Activity:   . Days of Exercise per Week: Not on file  . Minutes of Exercise per Session: Not on file  Stress:   . Feeling of Stress : Not on file  Social Connections:   . Frequency of Communication with Friends and Family: Not on file  . Frequency of Social Gatherings with Friends and Family: Not on file  . Attends Religious Services: Not on file  . Active Member of Clubs or Organizations: Not on file  . Attends Archivist Meetings: Not on file  . Marital Status: Not on file  Intimate Partner Violence:   . Fear of Current or Ex-Partner: Not on file  . Emotionally Abused: Not on file  . Physically Abused: Not on file  . Sexually Abused: Not on file    Current Outpatient Medications  Medication Sig Dispense Refill  . acetaminophen (TYLENOL) 500 MG tablet Take 500-1,000 mg by mouth every 6 (six) hours as needed for mild pain or headache.    . albuterol (PROVENTIL HFA;VENTOLIN HFA) 108 (90 BASE) MCG/ACT inhaler Inhale 1 puff into the lungs every 6 (six) hours as needed for wheezing or shortness of breath.    Marland Kitchen atorvastatin (LIPITOR) 10 MG tablet Take 1 tablet (10 mg total) by mouth daily. 30 tablet 1  . calcium carbonate (TUMS - DOSED IN MG ELEMENTAL CALCIUM) 500 MG chewable tablet Chew 500 mg by mouth  daily as needed for indigestion or heartburn.     . fluticasone (FLONASE) 50 MCG/ACT nasal spray Place 1 spray into both nostrils daily as needed for allergies or rhinitis.    Marland Kitchen lisinopril (PRINIVIL,ZESTRIL) 2.5 MG tablet Take 1 tablet (2.5 mg total) by mouth daily. 30 tablet 1  . loratadine (CLARITIN) 10 MG tablet Take 10 mg by mouth daily as needed for allergies.    . metFORMIN (GLUCOPHAGE) 1000 MG tablet Take 1  tablet (1,000 mg total) by mouth 2 (two) times daily.    . mometasone (ASMANEX) 220 MCG/INH inhaler Inhale 1 puff into the lungs daily.    . Omega-3 Fatty Acids (FISH OIL) 1000 MG CAPS Take 1,000 mg by mouth daily.     . ONE TOUCH ULTRA TEST test strip 1 each by Other route as needed (GLUCOSE).     Marland Kitchen pioglitazone (ACTOS) 15 MG tablet Take 15 mg by mouth daily.    . repaglinide (PRANDIN) 1 MG tablet Take 1 mg by mouth 2 (two) times daily before a meal.     . TOPROL XL 25 MG 24 hr tablet Take 25 mg by mouth daily.      No current facility-administered medications for this visit.    No Known Allergies    Review of Systems:   General:  Normal appetite, normal energy, no weight gain, no weight loss, no fever  Cardiac:  no chest pain with exertion, no chest pain at rest, no SOB with exertion, no resting SOB, no PND, no orthopnea, no palpitations, no arrhythmia, no atrial fibrillation, no LE edema, no dizzy spells, no syncope  Respiratory:  no shortness of breath, no home oxygen, no productive cough, no dry cough, no bronchitis, no wheezing, no hemoptysis, no asthma, no pain with inspiration or cough, no sleep apnea, no CPAP at night  GI:   no difficulty swallowing, no reflux, no frequent heartburn, no hiatal hernia, no abdominal pain, no constipation, no diarrhea, no hematochezia, no hematemesis, no melena  GU:   no dysuria,  no frequency, no urinary tract infection, no hematuria, no enlarged prostate, no kidney stones, no kidney disease  Vascular:  no pain suggestive of claudication,  no pain in feet, no leg cramps, no varicose veins, no DVT, no non-healing foot ulcer  Neuro:   no stroke, no TIA's, no seizures, no headaches, no temporary blindness one eye,  no slurred speech, no peripheral neuropathy, no chronic pain, no instability of gait, no memory/cognitive dysfunction  Musculoskeletal: no arthritis, no joint swelling, no myalgias, no difficulty walking, no mobility   Skin:   no rash, no itching, no skin infections, no pressure sores or ulcerations  Psych:   no anxiety, no depression, no nervousness, no unusual recent stress  Eyes:   no blurry vision, no floaters, no recent vision changes, + wears glasses for reading only  ENT:   no hearing loss, no loose or painful teeth, no dentures, last saw dentist within the past year  Hematologic:  no easy bruising, no abnormal bleeding, no clotting disorder, no frequent epistaxis  Endocrine:  + diabetes, does check CBG's at home     Physical Exam:   BP (!) 158/91 (BP Location: Left Arm, Patient Position: Sitting)   Pulse 93   Resp 18   Ht 6' (1.829 m)   Wt 193 lb (87.5 kg)   SpO2 94% Comment: RA  BMI 26.18 kg/m   General:    well-appearing  HEENT:  Unremarkable   Neck:   no JVD, no bruits, no adenopathy   Chest:   clear to auscultation, symmetrical breath sounds, no wheezes, no rhonchi   CV:   RRR, grade IV/VI holosystolic murmur best at apex  Abdomen:  soft, non-tender, no masses   Extremities:  warm, well-perfused, pulses palpable, no LE edema  Rectal/GU  Deferred  Neuro:   Grossly non-focal and symmetrical throughout  Skin:   Clean and dry, no rashes, no breakdown   Diagnostic Tests:  ECHOCARDIOGRAM REPORT       Patient Name:  [name] Date of Exam: 01/26/2020  Medical Rec #: [id number]    Height:    72.0 in  Accession #:  [id number]    Weight:    188.5 lb  Date of Birth: Jul 16, 1965    BSA:     2.078 m  Patient Age:  56 years     BP:      140/87 mmHg  Patient  Gender: M        HR:      66 bpm.  Exam Location: [facility]   Procedure: 2D Echo, 3D Echo, Cardiac Doppler and Color Doppler   Indications:  I34.0 Mitral regurgitation    History:    Patient has prior history of Echocardiogram examinations,  most         recent 01/20/2019. Colon cancer and Stroke; Risk         Factors:Hypertension and Diabetes.    Sonographer:  Marygrace Drought RCS  Referring Phys: Jettie Booze   IMPRESSIONS    1. There is severe eccentric mitral regurgitation. There is a flail P2  segment. There is reversal of flow in the R upper pulmonary vein. 2D PISA  (PISA radius 1.6 cm with aliasing velocity 30 cm/s) with angle correction  performed demonstrates ERO 0.54  cm2 and Rvol 94 cc. LVIDd is now 5.4 cm (was 5.0 cm) and LVIDs is now 3.5  cm (was 3.1 cm) indicative of progression of LV volume overload. TEE is  recommended for better characterization. The mitral valve is myxomatous.  Severe mitral valve regurgitation.  No evidence of mitral stenosis.  2. Left ventricular ejection fraction, by estimation, is 65 to 70%. Left  ventricular ejection fraction by 3D volume is 66 %. The left ventricle has  normal function. The left ventricle has no regional wall motion  abnormalities. Left ventricular diastolic  parameters were normal.  3. Right ventricular systolic function is normal. The right ventricular  size is normal. There is normal pulmonary artery systolic pressure. The  estimated right ventricular systolic pressure is 81.4 mmHg.  4. Left atrial size was mildly dilated.  5. The aortic valve is tricuspid. Aortic valve regurgitation is not  visualized. No aortic stenosis is present.  6. The inferior vena cava is normal in size with greater than 50%  respiratory variability, suggesting right atrial pressure of 3 mmHg.   Comparison(s): Changes from prior study are noted. Severe MR is still  present. LV  dimenstions have enlarged from prior study.   FINDINGS  Left Ventricle: Left ventricular ejection fraction, by estimation, is 65  to 70%. Left ventricular ejection fraction by 3D volume is 66 %. The left  ventricle has normal function. The left ventricle has no regional wall  motion abnormalities. The left  ventricular internal cavity size was normal in size. There is no left  ventricular hypertrophy. Left ventricular diastolic parameters were  normal.   Right Ventricle: The right ventricular size is normal. No increase in  right ventricular wall thickness. Right ventricular systolic function is  normal. There is normal pulmonary artery systolic pressure. The tricuspid  regurgitant velocity is 2.05 m/s, and  with an assumed right atrial pressure of 3 mmHg, the estimated right  ventricular systolic pressure is 48.1 mmHg.   Left Atrium: Left atrial size was mildly dilated.   Right Atrium: Right atrial size was normal in size.   Pericardium: There is no evidence of pericardial effusion.  Mitral Valve: There is severe eccentric mitral regurgitation. There is a  flail P2 segment. There is reversal of flow in the R upper pulmonary vein.  2D PISA (PISA radius 1.6 cm with aliasing velocity 30 cm/s) with angle  correction performed demonstrates  ERO 0.54 cm2 and Rvol 94 cc. LVIDd is now 5.4 cm (was 5.0 cm) and LVIDs is  now 3.5 cm (was 3.1 cm) indicative of progression of LV volume overload.  TEE is recommended for better characterization. The mitral valve is  myxomatous. Severe mitral valve  regurgitation, with anteriorly-directed jet. No evidence of mitral valve  stenosis.   Tricuspid Valve: The tricuspid valve is grossly normal. Tricuspid valve  regurgitation is trivial. No evidence of tricuspid stenosis.   Aortic Valve: The aortic valve is tricuspid. Aortic valve regurgitation is  not visualized. No aortic stenosis is present.   Pulmonic Valve: The pulmonic valve was grossly  normal. Pulmonic valve  regurgitation is trivial. No evidence of pulmonic stenosis.   Aorta: The aortic root and ascending aorta are structurally normal, with  no evidence of dilitation.   Venous: The inferior vena cava is normal in size with greater than 50%  respiratory variability, suggesting right atrial pressure of 3 mmHg.   IAS/Shunts: The atrial septum is grossly normal.     LEFT VENTRICLE  PLAX 2D  LVIDd:     5.40 cm     Diastology  LVIDs:     3.50 cm     LV e' medial:  6.74 cm/s  LV PW:     1.30 cm     LV E/e' medial: 17.2  LV IVS:    1.00 cm     LV e' lateral:  11.00 cm/s  LVOT diam:   2.30 cm     LV E/e' lateral: 10.5  LV SV:     81  LV SV Index:  39  LVOT Area:   4.15 cm    3D Volume EF                 LV 3D EF:  Left                       ventricular                       ejection                       fraction by                       3D volume                       is 66 %.                   3D Volume EF:                 3D EF:    66 %   RIGHT VENTRICLE  RV Basal diam: 4.30 cm  RV S prime:   14.80 cm/s  TAPSE (M-mode): 2.6 cm  RVSP:      19.8 mmHg   LEFT ATRIUM       Index    RIGHT ATRIUM      Index  LA diam:    3.90 cm 1.88 cm/m RA Pressure: 3.00 mmHg  LA Vol (A2C):  91.8 ml 44.19 ml/m RA  Area:   15.50 cm  LA Vol (A4C):  59.0 ml 28.40 ml/m RA Volume:  40.00 ml 19.25 ml/m  LA Biplane Vol: 79.3 ml 38.17 ml/m  AORTIC VALVE  LVOT Vmax:  94.60 cm/s  LVOT Vmean: 68.600 cm/s  LVOT VTI:  0.195 m    AORTA  Ao Root diam: 3.20 cm  Ao Asc diam: 3.60 cm   MITRAL VALVE         TRICUSPID VALVE  MV Area (PHT):        TR Peak grad:  16.8 mmHg  MV Decel Time:        TR Vmax:     205.00 cm/s  MR Peak grad:  104.9 mmHg Estimated RAP: 3.00 mmHg  MR Mean grad:  68.0 mmHg  RVSP:      19.8 mmHg  MR Vmax:     512.00 cm/s  MR Vmean:    391.0 cm/s SHUNTS  MR PISA:     2.26 cm  Systemic VTI: 0.20 m  MR PISA Eff ROA: 14 mm   Systemic Diam: 2.30 cm  MR PISA Radius: 0.60 cm  MV E velocity: 116.00 cm/s  MV A velocity: 90.00 cm/s  MV E/A ratio: 1.29   Eleonore Chiquito MD  Electronically signed by Eleonore Chiquito MD  Signature Date/Time: 01/26/2020/11:09:06 AM        TRANSESOPHOGEAL ECHO REPORT       Patient Name:  [name] Date of Exam: 02/10/2020  Medical Rec #: [id number]    Height:    72.0 in  Accession #:  [id number]    Weight:    185.0 lb  Date of Birth: 03-04-1966    BSA:     2.061 m  Patient Age:  53 years     BP:      146/84 mmHg  Patient Gender: M        HR:      73 bpm.  Exam Location: Inpatient   Procedure: Transesophageal Echo, 3D Echo, Color Doppler and Cardiac  Doppler   Indications:   I34.0 Nonrheumatic mitral (valve) insufficiency    History:     Patient has prior history of Echocardiogram examinations,  most          recent 01/26/2020. Risk Factors:Hypertension, Diabetes  and          Dyslipidemia.    Sonographer:   Raquel Sarna Senior RDCS  Referring Phys: [place]  Diagnosing Phys: [name] MD   PROCEDURE: After discussion of the risks and benefits of a TEE, an  informed consent was obtained from the patient. The transesophogeal probe  was passed without difficulty through the esophogus of the patient. Local  oropharyngeal anesthetic was provided  with Cetacaine. Sedation performed by different physician. The patient was  monitored while under deep sedation. Anesthestetic sedation was provided  intravenously by Anesthesiology: 239mg  of Propofol, 40mg  of Lidocaine. The  patient developed no    complications during the procedure.   IMPRESSIONS    1. Left ventricular ejection fraction, by estimation, is 60 to 65%. The  left ventricle has normal function.  2. Right ventricular systolic function is normal. The right ventricular  size is normal.  3. No left atrial/left atrial appendage thrombus was detected.  4. Moderate flail of posterior leaflet.  5. The MR jet is very eccentric and the degree if MR is difficult to  quatify. . The mitral valve is abnormal. Moderate to severe mitral valve  regurgitation.  6. The  aortic valve is normal in structure. Aortic valve regurgitation is  not visualized. No aortic stenosis is present.   FINDINGS  Left Ventricle: Left ventricular ejection fraction, by estimation, is 60  to 65%. The left ventricle has normal function. The left ventricular  internal cavity size was normal in size.   Right Ventricle: The right ventricular size is normal. No increase in  right ventricular wall thickness. Right ventricular systolic function is  normal.   Left Atrium: Left atrial size was normal in size. No left atrial/left  atrial appendage thrombus was detected.   Right Atrium: Right atrial size was normal in size.   Pericardium: There is no evidence of pericardial effusion.   Mitral Valve: The MR jet is very eccentric and the degree if MR is  difficult to quatify. The mitral valve is abnormal. Moderate flail of the  middle scallop of the posterior mitral valve leaflet. Moderate to severe  mitral valve regurgitation. Pulmonary  venous flow is normal.   Tricuspid Valve: The tricuspid valve is normal in structure. Tricuspid  valve regurgitation is trivial.   Aortic Valve: The aortic valve is normal in structure. Aortic valve  regurgitation is not visualized. No aortic stenosis is present.   Pulmonic Valve: The pulmonic valve was normal in structure. Pulmonic valve  regurgitation is trivial.   Aorta: The aortic root and ascending aorta  are structurally normal, with  no evidence of dilitation. There is minimal (Grade I) plaque.   IAS/Shunts: No atrial level shunt detected by color flow Doppler.     MR Peak grad:  50.7 mmHg  MR Mean grad:  34.0 mmHg  MR Vmax:     356.00 cm/s  MR Vmean:    277.0 cm/s  MR PISA:     12.32 cm  MR PISA Eff ROA: 133 mm  MR PISA Radius: 1.40 cm   [name] MD  Electronically signed by [name] MD  Signature Date/Time: 02/10/2020/2:11:00 PM       ABDOMINAL AORTOGRAM  RIGHT/LEFT HEART CATH AND CORONARY ANGIOGRAPHY  Conclusion    The left ventricular systolic function is normal.  LV end diastolic pressure is normal.  The left ventricular ejection fraction is 55-65% by visual estimate.  There is moderate (3+) mitral regurgitation.  There is no aortic valve stenosis.  No angiographically apparent CAD.  Ao sat 96%, PA sat 67% on room air; PA pressure 22/7, mean PA 15 mm Hg; mean PCWP 8 mm Hg; CO 5 L/min; CI 2.4.  Normal right heart pressures.  No AAA. Patent right renal artery. Patient iliofemoral system bilaterally.   Severe, eccentric MR by TEE.  Will get consultation for possible repair of mitral valve.  Surgeon Notes    02/10/2020 8:50 AM CV Procedure addendum by Acie Fredrickson Wonda Cheng, MD  Indications  Severe mitral regurgitation [I34.0 (ICD-10-CM)]  Procedural Details  Technical Details The risks, benefits, and details of the procedure were explained to the patient.  The patient verbalized understanding and wanted to proceed.  Informed written consent was obtained.  PROCEDURE TECHNIQUE:  After Xylocaine anesthesia, a 5 French sheath was placed in the right antecubital area in exchange for a peripheral IV. A 5 French balloontipped Swan-Ganz catheter was advanced to the pulmonary artery under fluoroscopic guidance. Hemodynamic pressures were obtained. Oxygen saturations were obtained. After Xylocaine anesthesia, a 76F sheath was placed in the  right radial artery with a single anterior needle wall stick.     Left heart cath and  Right coronary angiography  were done using a Judkins R4 guide catheter.  Left coronary angiography was done using a Judkins L4 guide catheter.  LV gram and abdominal aortogram were done using a pigtail catheter. TR band for hemostasis.      Contrast: 130 cc  Estimated blood loss <50 mL.   During this procedure medications were administered to achieve and maintain moderate conscious sedation while the patient's heart rate, blood pressure, and oxygen saturation were continuously monitored and I was present face-to-face 100% of this time.  Medications (Filter: Administrations occurring from 0900 to 1016 on 02/10/20) (important) Continuous medications are totaled by the amount administered until 02/10/20 1016.  Heparin (Porcine) in NaCl 1000-0.9 UT/500ML-% SOLN (mL) Total volume:  1,000 mL Date/Time  Rate/Dose/Volume Action  02/10/20 0909  500 mL Given  0909  500 mL Given    fentaNYL (SUBLIMAZE) injection (mcg) Total dose:  25 mcg Date/Time  Rate/Dose/Volume Action  02/10/20 0913  25 mcg Given    midazolam (VERSED) injection (mg) Total dose:  2 mg Date/Time  Rate/Dose/Volume Action  02/10/20 0913  2 mg Given    lidocaine (PF) (XYLOCAINE) 1 % injection (mL) Total volume:  4 mL Date/Time  Rate/Dose/Volume Action  02/10/20 0930  2 mL Given  0932  2 mL Given    Radial Cocktail/Verapamil only (mL) Total volume:  10 mL Date/Time  Rate/Dose/Volume Action  02/10/20 0934  10 mL Given    heparin sodium (porcine) injection (Units) Total dose:  4,500 Units Date/Time  Rate/Dose/Volume Action  02/10/20 0943  4,500 Units Given    iohexol (OMNIPAQUE) 350 MG/ML injection (mL) Total volume:  130 mL Date/Time  Rate/Dose/Volume Action  02/10/20 1004  130 mL Given    Sedation Time  Sedation Time Physician-1: 51 minutes 18 seconds  Contrast  Medication Name Total Dose  iohexol (OMNIPAQUE) 350 MG/ML  injection 130 mL    Radiation/Fluoro  Fluoro time: 7.3 (min) DAP: 10.9 (mGycm2) Cumulative Air Kerma: 151 (mGy)  Coronary Findings  Diagnostic Dominance: Right Left Main  Vessel was injected. Vessel is normal in caliber. Vessel is angiographically normal.  Left Anterior Descending  Vessel was injected. Vessel is normal in caliber. Vessel is angiographically normal.  Left Circumflex  Vessel was injected. Vessel is normal in caliber. Vessel is angiographically normal.  Right Coronary Artery  Vessel was injected. Vessel is normal in caliber. Vessel is angiographically normal.  Intervention  No interventions have been documented. Right Heart  Right Heart Pressures Ao sat 96%, PA sat 67% on room air; PA pressure 22/7, mean PA 15 mm Hg; mean PCWP 8 mm Hg; CO 5 L/min; CI 2.4  Wall Motion  Resting               Left Heart  Left Ventricle The left ventricular size is normal. The left ventricular systolic function is normal. LV end diastolic pressure is normal. The left ventricular ejection fraction is 55-65% by visual estimate. No regional wall motion abnormalities. There is moderate (3+) mitral regurgitation.  Aortic Valve There is no aortic valve stenosis.  Aorta Abdominal Aorta:  The abdominal aorta is normal in size. The abdominal aorta is not aneurysmal.   Patent right renal artery. Patient iliofemoral system bilaterally.  Coronary Diagrams  Diagnostic Dominance: Right  Intervention  Implants   No implant documentation for this case.  Syngo Images  Show images for CARDIAC CATHETERIZATION Images on Long Term Storage  Show images for Devontae, Casasola "[place]" Link to Procedure Log  Procedure Log  Hemo Data   Most Recent Value  Fick Cardiac Output 5 L/min  Fick Cardiac Output Index 2.43 (L/min)/BSA  RA A Wave 4 mmHg  RA V Wave 3 mmHg  RA Mean 1 mmHg  RV Systolic Pressure 23 mmHg  RV Diastolic Pressure 0 mmHg  RV EDP 5 mmHg  PA Systolic Pressure 22  mmHg  PA Diastolic Pressure 7 mmHg  PA Mean 15 mmHg  PW A Wave 13 mmHg  PW V Wave 14 mmHg  PW Mean 8 mmHg  AO Systolic Pressure 99 mmHg  AO Diastolic Pressure 61 mmHg  AO Mean 77 mmHg  LV Systolic Pressure 563 mmHg  LV Diastolic Pressure 1 mmHg  LV EDP 13 mmHg  AOp Systolic Pressure 149 mmHg  AOp Diastolic Pressure 66 mmHg  AOp Mean Pressure 85 mmHg  LVp Systolic Pressure 702 mmHg  LVp Diastolic Pressure 5 mmHg  LVp EDP Pressure 16 mmHg  QP/QS 1  TPVR Index 6.19 HRUI  TSVR Index 32.16 HRUI  PVR SVR Ratio 0.09  TPVR/TSVR Ratio 0.19     CT CHEST, ABDOMEN, AND PELVIS WITH CONTRAST  TECHNIQUE: Multidetector CT imaging of the chest, abdomen and pelvis was performed following the standard protocol during bolus administration of intravenous contrast.  CONTRAST:  169mL OMNIPAQUE IOHEXOL 300 MG/ML  SOLN  COMPARISON:  CT chest dated 06/08/2019. CT abdomen/pelvis dated 01/08/2019.  FINDINGS: CT CHEST FINDINGS  Cardiovascular: Heart is normal in size.  No pericardial effusion.  No evidence of thoracic aortic aneurysm. Mild atherosclerotic calcifications of the aortic arch.  Mediastinum/Nodes: No suspicious mediastinal lymphadenopathy.  Status post right thyroidectomy.  Lungs/Pleura: Again seen are numerous pleural-based/subpleural nodules in the lungs bilaterally, many of which are triangular subpleural lymph nodes, measuring up to 4-5 mm. These are unchanged from October 2020 and can be considered benign at this point, despite the patient's clinical history.  No new/suspicious pulmonary nodules.  Scattered areas of bronchiectasis in the bilateral upper lobes (for example series 6/image 62). No focal consolidation.  No pleural effusion or pneumothorax.  Musculoskeletal: No focal osseous lesions.  CT ABDOMEN PELVIS FINDINGS  Hepatobiliary: Tiny hepatic cysts measuring up to 7 mm in the left hepatic lobe (series 2/image 53).  Gallbladder is  unremarkable. No intrahepatic or extrahepatic ductal dilatation.  Pancreas: Within normal limits.  Spleen: Rounded, heterogeneously enhancing central splenic mass measuring 12.1 x 9.4 cm (series 2/image 63), unchanged from priors. This appears to have been present on 2015 lumbar spine MRI, favoring a benign etiology such as splenic hamartoma or hemangioma.  Adrenals/Urinary Tract: Bilateral adrenal glands are within normal limits.  Status post left nephrectomy.  Right kidney is within normal limits.  No hydronephrosis.  Thick-walled bladder, although underdistended.  Stomach/Bowel: Stomach is within normal limits.  No evidence of bowel obstruction.  Normal appendix (series 2/image 93).  Status post partial colon resection with suture line in the central lower abdomen (series 2/image 34).  No colonic wall thickening or mass is evident on CT.  Vascular/Lymphatic: No evidence of abdominal aortic aneurysm.  Atherosclerotic calcifications of the abdominal aorta and branch vessels.  No suspicious abdominopelvic lymphadenopathy.  Reproductive: Prostate is grossly unremarkable.  Other: No abdominopelvic ascites.  Tiny fat containing right inguinal hernia.  Musculoskeletal: Scattered fat lobules along the subcutaneous wall bilaterally (for example series 2/image 99 and 110), more conspicuous than on priors, but presumably still benign.  Degenerative changes of the lumbar spine.  IMPRESSION: Status post partial colon resection. No findings suspicious for recurrent or metastatic  disease.  Numerous bilateral pulmonary nodules are unchanged x 12 months, compatible with benign subpleural lymph nodes. Fleischner Society guidelines do not apply, but these are not considered suspicious for metastases, and do not require additional dedicated follow-up.  Prior right thyroidectomy and left nephrectomy.  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  Additional ancillary findings as above.   Electronically Signed   By: Julian Hy M.D.   On: 01/09/2020 14:43    Impression:  Patient has mitral valve prolapse with stage C severe asymptomatic primary mitral regurgitation.  He remains physically active and has not experienced any symptoms of exertional shortness of breath, orthopnea, chest pain or palpitations.  I have personally reviewed the patient's recent multiple echocardiograms and diagnostic cardiac catheterization.  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  There is type II dysfunction with no obvious sign of leaflet perforation or other complicating features that might have been related to his history of bacterial endocarditis.  Left ventricular function remains normal.  There may be some slight left ventricular chamber enlargement.  Diagnostic cardiac catheterization reveals normal coronary artery anatomy with no significant coronary artery disease.  Right heart pressures were normal.  Options include elective mitral valve repair versus continued close observation on medical therapy.  Based upon review of the patient's transesophageal echocardiogram I feel there is greater than 95% likelihood of successful and durable mitral valve repair with anticipated operative risks well below 1%.  Patient appears to be a good candidate for minimally invasive approach for surgery.   Plan:  The patient was counseled at length regarding the indications, risks and potential benefits of mitral valve repair.  The rationale for elective surgery has been explained, including a comparison between surgery and continued medical therapy with close follow-up.  The likelihood of successful and durable mitral valve  repair has been discussed with particular reference to the findings of his recent echocardiogram.  Based upon these findings and previous experience, I have quoted him a greater than 95 percent likelihood of successful valve repair with less than 1 percent risk of mortality or major morbidity.  Alternative surgical approaches have been discussed including a comparison between conventional sternotomy and minimally-invasive techniques.  The relative risks and benefits of each have been reviewed as they pertain to the patient's specific circumstances, and expectations for the patient's postoperative convalescence have been discussed.  The patient desires to proceed just after the first of the year.  He will discuss with his employer and his wife possible surgical dates and contact our office in the near future to schedule surgery.  He will return to our office for follow-up prior to surgery in early January.  All questions answered.     I spent in excess of 90 minutes during the conduct of this office consultation and >50% of this time involved direct face-to-face encounter with the patient for counseling and/or coordination of their care.    Valentina Gu. Roxy Manns, MD 02/15/2020 2:51 PM

## 2020-02-15 NOTE — Patient Instructions (Addendum)
Continue all previous medications without any changes at this time  

## 2020-02-16 ENCOUNTER — Encounter: Payer: Self-pay | Admitting: *Deleted

## 2020-02-16 ENCOUNTER — Other Ambulatory Visit: Payer: Self-pay | Admitting: *Deleted

## 2020-02-16 DIAGNOSIS — I340 Nonrheumatic mitral (valve) insufficiency: Secondary | ICD-10-CM

## 2020-04-05 ENCOUNTER — Other Ambulatory Visit: Payer: Self-pay

## 2020-04-05 ENCOUNTER — Emergency Department (HOSPITAL_COMMUNITY)
Admission: EM | Admit: 2020-04-05 | Discharge: 2020-04-06 | Disposition: A | Discharge status: Home / Self Care | Payer: BC Managed Care – PPO | Attending: Emergency Medicine | Admitting: Emergency Medicine

## 2020-04-05 ENCOUNTER — Encounter (HOSPITAL_COMMUNITY): Payer: Self-pay | Admitting: Emergency Medicine

## 2020-04-05 DIAGNOSIS — Z5321 Procedure and treatment not carried out due to patient leaving prior to being seen by health care provider: Secondary | ICD-10-CM | POA: Insufficient documentation

## 2020-04-05 DIAGNOSIS — R319 Hematuria, unspecified: Secondary | ICD-10-CM | POA: Diagnosis not present

## 2020-04-05 DIAGNOSIS — R350 Frequency of micturition: Secondary | ICD-10-CM | POA: Insufficient documentation

## 2020-04-05 DIAGNOSIS — R6883 Chills (without fever): Secondary | ICD-10-CM | POA: Insufficient documentation

## 2020-04-05 LAB — URINALYSIS, ROUTINE W REFLEX MICROSCOPIC: RBC / HPF: >50 RBC/hpf — ABNORMAL HIGH (ref 0–5)

## 2020-04-05 LAB — CBC WITH DIFFERENTIAL/PLATELET
Abs Immature Granulocytes: 0.07 10*3/uL (ref 0.00–0.07)
Basophils Absolute: 0 10*3/uL (ref 0.0–0.1)
Basophils Relative: 0 %
Eosinophils Absolute: 0 10*3/uL (ref 0.0–0.5)
Eosinophils Relative: 0 %
HCT: 42.3 % (ref 39.0–52.0)
Hemoglobin: 14.4 g/dL (ref 13.0–17.0)
Immature Granulocytes: 1 %
Lymphocytes Relative: 9 %
Lymphs Abs: 1.1 10*3/uL (ref 0.7–4.0)
MCH: 32.6 pg (ref 26.0–34.0)
MCHC: 34 g/dL (ref 30.0–36.0)
MCV: 95.7 fL (ref 80.0–100.0)
Monocytes Absolute: 1 10*3/uL (ref 0.1–1.0)
Monocytes Relative: 8 %
Neutro Abs: 9.5 10*3/uL — ABNORMAL HIGH (ref 1.7–7.7)
Neutrophils Relative %: 82 %
Platelets: 123 10*3/uL — ABNORMAL LOW (ref 150–400)
RBC: 4.42 MIL/uL (ref 4.22–5.81)
RDW: 12.2 % (ref 11.5–15.5)
WBC: 11.7 10*3/uL — ABNORMAL HIGH (ref 4.0–10.5)
nRBC: 0 % (ref 0.0–0.2)

## 2020-04-05 LAB — BASIC METABOLIC PANEL
Anion gap: 10 (ref 5–15)
BUN: 17 mg/dL (ref 6–20)
CO2: 25 mmol/L (ref 22–32)
Calcium: 9.1 mg/dL (ref 8.9–10.3)
Chloride: 99 mmol/L (ref 98–111)
Creatinine, Ser: 1.07 mg/dL (ref 0.61–1.24)
GFR, Estimated: >60 mL/min (ref >60)
Glucose, Bld: 229 mg/dL — ABNORMAL HIGH (ref 70–99)
Potassium: 4 mmol/L (ref 3.5–5.1)
Sodium: 134 mmol/L — ABNORMAL LOW (ref 135–145)

## 2020-04-05 NOTE — ED Triage Notes (Signed)
Patient reports urinary frequency/hematuria with chills this afternoon .

## 2020-04-06 ENCOUNTER — Other Ambulatory Visit: Payer: Self-pay | Admitting: *Deleted

## 2020-04-06 DIAGNOSIS — N10 Acute tubulo-interstitial nephritis: Secondary | ICD-10-CM | POA: Diagnosis not present

## 2020-04-06 NOTE — ED Notes (Signed)
Pt decided to leave 

## 2020-04-09 ENCOUNTER — Ambulatory Visit (HOSPITAL_COMMUNITY): Payer: BC Managed Care – PPO

## 2020-04-09 ENCOUNTER — Encounter: Payer: BC Managed Care – PPO | Admitting: Thoracic Surgery (Cardiothoracic Vascular Surgery)

## 2020-04-09 ENCOUNTER — Other Ambulatory Visit (HOSPITAL_COMMUNITY): Payer: BC Managed Care – PPO

## 2020-04-11 ENCOUNTER — Other Ambulatory Visit: Payer: Self-pay | Admitting: *Deleted

## 2020-04-11 DIAGNOSIS — I340 Nonrheumatic mitral (valve) insufficiency: Secondary | ICD-10-CM

## 2020-04-12 ENCOUNTER — Encounter: Payer: Self-pay | Admitting: *Deleted

## 2020-04-12 ENCOUNTER — Inpatient Hospital Stay: Admit: 2020-04-12 | Payer: BC Managed Care – PPO | Admitting: Thoracic Surgery (Cardiothoracic Vascular Surgery)

## 2020-04-12 SURGERY — REPAIR, MITRAL VALVE, MINIMALLY INVASIVE
Anesthesia: General | Site: Chest | Laterality: Right

## 2020-04-17 DIAGNOSIS — I340 Nonrheumatic mitral (valve) insufficiency: Secondary | ICD-10-CM | POA: Diagnosis not present

## 2020-04-17 DIAGNOSIS — I10 Essential (primary) hypertension: Secondary | ICD-10-CM | POA: Diagnosis not present

## 2020-04-17 DIAGNOSIS — E1169 Type 2 diabetes mellitus with other specified complication: Secondary | ICD-10-CM | POA: Diagnosis not present

## 2020-04-17 DIAGNOSIS — Z87440 Personal history of urinary (tract) infections: Secondary | ICD-10-CM | POA: Diagnosis not present

## 2020-04-30 DIAGNOSIS — R319 Hematuria, unspecified: Secondary | ICD-10-CM | POA: Diagnosis not present

## 2020-05-07 ENCOUNTER — Encounter: Payer: Self-pay | Admitting: Thoracic Surgery (Cardiothoracic Vascular Surgery)

## 2020-05-07 ENCOUNTER — Encounter (HOSPITAL_COMMUNITY)
Admission: RE | Admit: 2020-05-07 | Discharge: 2020-05-07 | Disposition: A | Discharge status: Home / Self Care | Payer: BC Managed Care – PPO | Source: Ambulatory Visit | Attending: Thoracic Surgery (Cardiothoracic Vascular Surgery) | Admitting: Thoracic Surgery (Cardiothoracic Vascular Surgery)

## 2020-05-07 ENCOUNTER — Other Ambulatory Visit: Payer: Self-pay

## 2020-05-07 ENCOUNTER — Ambulatory Visit (HOSPITAL_COMMUNITY)
Admission: RE | Admit: 2020-05-07 | Discharge: 2020-05-07 | Disposition: A | Discharge status: Home / Self Care | Payer: BC Managed Care – PPO | Source: Ambulatory Visit | Attending: Thoracic Surgery (Cardiothoracic Vascular Surgery) | Admitting: Thoracic Surgery (Cardiothoracic Vascular Surgery)

## 2020-05-07 ENCOUNTER — Ambulatory Visit (HOSPITAL_BASED_OUTPATIENT_CLINIC_OR_DEPARTMENT_OTHER)
Admission: RE | Admit: 2020-05-07 | Discharge: 2020-05-07 | Disposition: A | Discharge status: Home / Self Care | Payer: BC Managed Care – PPO | Source: Ambulatory Visit | Attending: Thoracic Surgery (Cardiothoracic Vascular Surgery) | Admitting: Thoracic Surgery (Cardiothoracic Vascular Surgery)

## 2020-05-07 ENCOUNTER — Encounter (HOSPITAL_COMMUNITY): Payer: Self-pay

## 2020-05-07 ENCOUNTER — Other Ambulatory Visit (HOSPITAL_COMMUNITY)
Admission: RE | Admit: 2020-05-07 | Discharge: 2020-05-07 | Disposition: A | Discharge status: Home / Self Care | Payer: BC Managed Care – PPO | Source: Ambulatory Visit | Attending: Thoracic Surgery (Cardiothoracic Vascular Surgery) | Admitting: Thoracic Surgery (Cardiothoracic Vascular Surgery)

## 2020-05-07 ENCOUNTER — Ambulatory Visit (INDEPENDENT_AMBULATORY_CARE_PROVIDER_SITE_OTHER): Payer: BC Managed Care – PPO | Admitting: Thoracic Surgery (Cardiothoracic Vascular Surgery)

## 2020-05-07 DIAGNOSIS — Z87440 Personal history of urinary (tract) infections: Secondary | ICD-10-CM | POA: Diagnosis not present

## 2020-05-07 DIAGNOSIS — I340 Nonrheumatic mitral (valve) insufficiency: Secondary | ICD-10-CM | POA: Diagnosis not present

## 2020-05-07 DIAGNOSIS — J45909 Unspecified asthma, uncomplicated: Secondary | ICD-10-CM | POA: Diagnosis not present

## 2020-05-07 DIAGNOSIS — Z01818 Encounter for other preprocedural examination: Secondary | ICD-10-CM | POA: Insufficient documentation

## 2020-05-07 DIAGNOSIS — Z823 Family history of stroke: Secondary | ICD-10-CM | POA: Diagnosis not present

## 2020-05-07 DIAGNOSIS — Z85528 Personal history of other malignant neoplasm of kidney: Secondary | ICD-10-CM | POA: Diagnosis not present

## 2020-05-07 DIAGNOSIS — J811 Chronic pulmonary edema: Secondary | ICD-10-CM | POA: Diagnosis not present

## 2020-05-07 DIAGNOSIS — R0989 Other specified symptoms and signs involving the circulatory and respiratory systems: Secondary | ICD-10-CM | POA: Diagnosis not present

## 2020-05-07 DIAGNOSIS — Z905 Acquired absence of kidney: Secondary | ICD-10-CM | POA: Diagnosis not present

## 2020-05-07 DIAGNOSIS — E785 Hyperlipidemia, unspecified: Secondary | ICD-10-CM | POA: Diagnosis not present

## 2020-05-07 DIAGNOSIS — D62 Acute posthemorrhagic anemia: Secondary | ICD-10-CM | POA: Diagnosis not present

## 2020-05-07 DIAGNOSIS — Z85038 Personal history of other malignant neoplasm of large intestine: Secondary | ICD-10-CM | POA: Diagnosis not present

## 2020-05-07 DIAGNOSIS — Z7984 Long term (current) use of oral hypoglycemic drugs: Secondary | ICD-10-CM | POA: Diagnosis not present

## 2020-05-07 DIAGNOSIS — I517 Cardiomegaly: Secondary | ICD-10-CM | POA: Diagnosis not present

## 2020-05-07 DIAGNOSIS — D696 Thrombocytopenia, unspecified: Secondary | ICD-10-CM | POA: Diagnosis not present

## 2020-05-07 DIAGNOSIS — I10 Essential (primary) hypertension: Secondary | ICD-10-CM | POA: Diagnosis not present

## 2020-05-07 DIAGNOSIS — I081 Rheumatic disorders of both mitral and tricuspid valves: Secondary | ICD-10-CM | POA: Diagnosis not present

## 2020-05-07 DIAGNOSIS — Z01812 Encounter for preprocedural laboratory examination: Secondary | ICD-10-CM | POA: Insufficient documentation

## 2020-05-07 DIAGNOSIS — E8770 Fluid overload, unspecified: Secondary | ICD-10-CM | POA: Diagnosis not present

## 2020-05-07 DIAGNOSIS — Z800 Family history of malignant neoplasm of digestive organs: Secondary | ICD-10-CM | POA: Diagnosis not present

## 2020-05-07 DIAGNOSIS — E119 Type 2 diabetes mellitus without complications: Secondary | ICD-10-CM | POA: Diagnosis not present

## 2020-05-07 DIAGNOSIS — Z79899 Other long term (current) drug therapy: Secondary | ICD-10-CM | POA: Diagnosis not present

## 2020-05-07 DIAGNOSIS — Z20822 Contact with and (suspected) exposure to covid-19: Secondary | ICD-10-CM | POA: Diagnosis not present

## 2020-05-07 DIAGNOSIS — I511 Rupture of chordae tendineae, not elsewhere classified: Secondary | ICD-10-CM | POA: Diagnosis not present

## 2020-05-07 DIAGNOSIS — J90 Pleural effusion, not elsewhere classified: Secondary | ICD-10-CM | POA: Diagnosis not present

## 2020-05-07 DIAGNOSIS — Z4682 Encounter for fitting and adjustment of non-vascular catheter: Secondary | ICD-10-CM | POA: Diagnosis not present

## 2020-05-07 DIAGNOSIS — J9811 Atelectasis: Secondary | ICD-10-CM | POA: Diagnosis not present

## 2020-05-07 DIAGNOSIS — I4891 Unspecified atrial fibrillation: Secondary | ICD-10-CM | POA: Diagnosis not present

## 2020-05-07 DIAGNOSIS — J984 Other disorders of lung: Secondary | ICD-10-CM | POA: Diagnosis not present

## 2020-05-07 DIAGNOSIS — Z9221 Personal history of antineoplastic chemotherapy: Secondary | ICD-10-CM | POA: Diagnosis not present

## 2020-05-07 DIAGNOSIS — J189 Pneumonia, unspecified organism: Secondary | ICD-10-CM | POA: Diagnosis not present

## 2020-05-07 DIAGNOSIS — Z8249 Family history of ischemic heart disease and other diseases of the circulatory system: Secondary | ICD-10-CM | POA: Diagnosis not present

## 2020-05-07 LAB — CBC
HCT: 45.4 % (ref 39.0–52.0)
Hemoglobin: 15.7 g/dL (ref 13.0–17.0)
MCH: 32.4 pg (ref 26.0–34.0)
MCHC: 34.6 g/dL (ref 30.0–36.0)
MCV: 93.6 fL (ref 80.0–100.0)
Platelets: 138 K/uL — ABNORMAL LOW (ref 150–400)
RBC: 4.85 MIL/uL (ref 4.22–5.81)
RDW: 12.5 % (ref 11.5–15.5)
WBC: 7.5 K/uL (ref 4.0–10.5)
nRBC: 0 % (ref 0.0–0.2)

## 2020-05-07 LAB — BLOOD GAS, ARTERIAL
Acid-base deficit: 4.9 mmol/L — ABNORMAL HIGH (ref 0.0–2.0)
Bicarbonate: 19.2 mmol/L — ABNORMAL LOW (ref 20.0–28.0)
Drawn by: 58793
FIO2: 21
O2 Saturation: 97.7 %
Patient temperature: 37
pCO2 arterial: 32.6 mmHg (ref 32.0–48.0)
pH, Arterial: 7.387 (ref 7.350–7.450)
pO2, Arterial: 106 mmHg (ref 83.0–108.0)

## 2020-05-07 LAB — SURGICAL PCR SCREEN
MRSA, PCR: NEGATIVE
Staphylococcus aureus: NEGATIVE

## 2020-05-07 LAB — COMPREHENSIVE METABOLIC PANEL WITH GFR
ALT: 29 U/L (ref 0–44)
AST: 30 U/L (ref 15–41)
Albumin: 3.9 g/dL (ref 3.5–5.0)
Alkaline Phosphatase: 43 U/L (ref 38–126)
Anion gap: 14 (ref 5–15)
BUN: 19 mg/dL (ref 6–20)
CO2: 16 mmol/L — ABNORMAL LOW (ref 22–32)
Calcium: 9.1 mg/dL (ref 8.9–10.3)
Chloride: 105 mmol/L (ref 98–111)
Creatinine, Ser: 1.06 mg/dL (ref 0.61–1.24)
GFR, Estimated: >60 mL/min
Glucose, Bld: 103 mg/dL — ABNORMAL HIGH (ref 70–99)
Potassium: 4.3 mmol/L (ref 3.5–5.1)
Sodium: 135 mmol/L (ref 135–145)
Total Bilirubin: 1.3 mg/dL — ABNORMAL HIGH (ref 0.3–1.2)
Total Protein: 6.8 g/dL (ref 6.5–8.1)

## 2020-05-07 LAB — URINALYSIS, ROUTINE W REFLEX MICROSCOPIC
Bilirubin Urine: NEGATIVE
Glucose, UA: >=500 mg/dL — AB
Hgb urine dipstick: NEGATIVE
Ketones, ur: NEGATIVE mg/dL
Leukocytes,Ua: NEGATIVE
Nitrite: NEGATIVE
Protein, ur: NEGATIVE mg/dL
Specific Gravity, Urine: 1.024 (ref 1.005–1.030)
pH: 5 (ref 5.0–8.0)

## 2020-05-07 LAB — TYPE AND SCREEN
ABO/RH(D): A POS
Antibody Screen: NEGATIVE

## 2020-05-07 LAB — SARS CORONAVIRUS 2 (TAT 6-24 HRS): SARS Coronavirus 2: NEGATIVE

## 2020-05-07 LAB — GLUCOSE, CAPILLARY: Glucose-Capillary: 85 mg/dL (ref 70–99)

## 2020-05-07 LAB — APTT: aPTT: 28 s (ref 24–36)

## 2020-05-07 LAB — HEMOGLOBIN A1C
Hgb A1c MFr Bld: 8.4 % — ABNORMAL HIGH (ref 4.8–5.6)
Mean Plasma Glucose: 194.38 mg/dL

## 2020-05-07 LAB — PROTIME-INR
INR: 1 (ref 0.8–1.2)
Prothrombin Time: 12.7 seconds (ref 11.4–15.2)

## 2020-05-07 NOTE — Progress Notes (Signed)
Pre-MVR ultrasound study completed.   Please see CV Proc for preliminary results.   Katalena Malveaux, RDMS  

## 2020-05-07 NOTE — Progress Notes (Signed)
Thurmond Butts, RN at [employer] [facility] [employer] notified of patient abnormal labs.

## 2020-05-07 NOTE — Patient Instructions (Signed)
   Continue taking all current medications without change through the day before surgery.  Make sure to bring all of your medications with you when you come for your Pre-Admission Testing appointment at [facility] Short-Stay Department.  Have nothing to eat or drink after midnight the night before surgery.  On the morning of surgery do not take any medications.  You may use your inhaler if desired.  At your appointment for Pre-Admission Testing at the [facility] Short-Stay Department you will be asked to sign permission forms for your upcoming surgery.  By definition your signature on these forms implies that you and/or your designee provide full informed consent for your planned surgical procedure(s), that alternative treatment options have been discussed, that you understand and accept any and all potential risks, and that you have some understanding of what to expect for your post-operative convalescence.  For any major cardiac surgical procedure potential operative risks include but are not limited to at least some risk of death, stroke or other neurologic complication, myocardial infarction, congestive heart failure, respiratory failure, renal failure, bleeding requiring blood transfusion and/or reexploration, irregular heart rhythm, heart block or bradycardia requiring permanent pacemaker, aortic dissection or other major vascular complication, pneumonia, pericardial effusion, pleural effusion, wound infection, pulmonary embolus or other thromboembolic complication, chronic pain, or other complications related to the specific procedure(s) performed.  Please call to schedule a follow-up appointment in our office prior to surgery if you have any unresolved questions about your planned surgical procedure, the associated risks, alternative treatment options, and/or expectations for your post-operative recovery.

## 2020-05-07 NOTE — Progress Notes (Signed)
PCP - Dr. Orpah Melter Cardiologist - Dr. Irish Lack  PPM/ICD - n/a Device Orders -  Rep Notified -   Chest x-ray - 05/07/20 EKG - 05/07/20 Stress Test - patient denies ECHO - 02/10/20 Cardiac Cath - 02/10/20  Sleep Study - patient recently underwent sleep study but was negative for OSA , per patient; negative stop bang CPAP -   Fasting Blood Sugar - 150's Checks Blood Sugar 2 times a day  Blood Thinner Instructions:n/a Aspirin Instructions: n/a  ERAS Protcol - NPO after midnight PRE-SURGERY Ensure or G2-   COVID TEST- 05/07/20   Anesthesia review: yes, recent cardiac testing   Patient denies shortness of breath, fever, cough and chest pain at PAT appointment   All instructions explained to the patient, with a verbal understanding of the material. Patient agrees to go over the instructions while at home for a better understanding. Patient also instructed to self quarantine after being tested for COVID-19. The opportunity to ask questions was provided.

## 2020-05-07 NOTE — Progress Notes (Signed)
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  Patient was originally seen in consultation on February 15, 2020.  Since then he has had problems with recurrent urinary tract infections which are felt to be related to his new medication for diabetes, Jardiance.  He completed a course of oral antibiotics last week and reports no ongoing symptoms of dysuria or urgency.  He otherwise reports no significant problems or complaints and he specifically denies any symptoms of shortness of breath.   Current Outpatient Medications  Medication Sig Dispense Refill  . acetaminophen (TYLENOL) 500 MG tablet Take 500-1,000 mg by mouth every 6 (six) hours as needed for mild pain or headache.    . albuterol (PROVENTIL HFA;VENTOLIN HFA) 108 (90 BASE) MCG/ACT inhaler Inhale 1 puff into the lungs every 6 (six) hours as needed for wheezing or shortness of breath.    Marland Kitchen atorvastatin (LIPITOR) 10 MG tablet Take 1 tablet (10 mg total) by mouth daily. 30 tablet 1  . calcium carbonate (TUMS - DOSED IN MG ELEMENTAL CALCIUM) 500 MG chewable tablet Chew 500 mg by mouth daily as needed for indigestion or heartburn.     . fluticasone (FLONASE) 50 MCG/ACT nasal spray  Place 1 spray into both nostrils daily as needed for allergies or rhinitis.    Marland Kitchen JARDIANCE 10 MG TABS tablet Take 10 mg by mouth daily.    Marland Kitchen lisinopril (PRINIVIL,ZESTRIL) 2.5 MG tablet Take 1 tablet (2.5 mg total) by mouth daily. 30 tablet 1  . loratadine (CLARITIN) 10 MG tablet Take 10 mg by mouth daily as needed for allergies.    . metFORMIN (GLUCOPHAGE) 1000 MG tablet Take 1 tablet (1,000 mg total) by mouth 2 (two) times daily.    . mometasone (ASMANEX) 220 MCG/INH inhaler Inhale 1 puff into the lungs daily.    . Omega-3 Fatty Acids (FISH OIL) 1000 MG CAPS Take 1,000 mg by mouth daily.     . ONE TOUCH ULTRA TEST test strip 1 each by Other route as needed (GLUCOSE).     Marland Kitchen pioglitazone (ACTOS) 15 MG tablet Take 15 mg by mouth daily.    . repaglinide (PRANDIN) 1 MG tablet Take 1 mg by mouth 2 (two) times daily before a meal.     . TOPROL XL 25 MG 24 hr tablet Take 25 mg by mouth daily.      No current facility-administered medications for this visit.      Physical Exam:   There were no vitals taken for this visit.  General:  Well-appearing  Chest:   Clear to auscultation  CV:   Regular rate and rhythm with prominent holosystolic murmur  Incisions:  n/a  Abdomen:  Soft nontender  Extremities:  Warm and well-perfused  Diagnostic  Tests:  n/a   Impression:  Patient has mitral valve prolapse with stage C severe asymptomatic primary mitral regurgitation.  He remains physically active and has not experienced any symptoms of exertional shortness of breath, orthopnea, chest pain or palpitations.  I have personally reviewed the patient's recent multiple echocardiograms, diagnostic cardiac catheterization, and CT angiograms.  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  There is type II dysfunction with no obvious  sign of leaflet perforation or other complicating features that might have been related to his history of bacterial endocarditis.  Left ventricular function remains normal.  There may be some slight left ventricular chamber enlargement.  Diagnostic cardiac catheterization reveals normal coronary artery anatomy with no significant coronary artery disease.  Right heart pressures were normal.  CT scans revealed no contraindications to peripheral cannulation for surgery.  Options include elective mitral valve repair versus continued close observation on medical therapy.  Based upon review of the patient's transesophageal echocardiogram I feel there is greater than 95% likelihood of successful and durable mitral valve repair with anticipated operative risks well below 1%.  Patient appears to be a good candidate for minimally invasive approach for surgery.   Plan:  The patient was again counseled at length regarding the indications, risks and potential benefits of mitral valve repair.  The rationale for elective surgery has been explained, including a comparison between surgery and continued medical therapy with close follow-up.  The likelihood of successful and durable mitral valve repair has been discussed with particular reference to the findings of his recent echocardiogram.  Based upon these findings and previous experience, I have quoted him a greater than 95 percent likelihood of successful valve repair with less than 1 percent risk of mortality or major morbidity.  Alternative surgical approaches have been discussed including a comparison between conventional sternotomy and minimally-invasive techniques.  The relative risks and benefits of each have been reviewed as they pertain to the patient's specific circumstances, and expectations for the patient's postoperative convalescence have been discussed.     The patient understands and accepts all potential risks of surgery including but not limited to  risk of death, stroke or other neurologic complication, myocardial infarction, congestive heart failure, respiratory failure, renal failure, bleeding requiring transfusion and/or reexploration, arrhythmia, heart block or bradycardia requiring permanent pacemaker insertion, infection or other wound complications, pneumonia, pleural and/or pericardial effusion, pulmonary embolus, aortic dissection or other major vascular complication, or other immediate or delayed complications related to valve repair or replacement including but not limited to recurrent or persistent mitral regurgitation and/or mitral stenosis, LV outflow tract obstruction, aortic insufficiency, paravalvular leak, posterior AV groove disruption, structural valve deterioration and failure, thrombosis, embolization, or endocarditis.  Specific risks potentially related to the minimally-invasive approach were discussed at length, including but not limited to risk of conversion to full or partial sternotomy, aortic dissection or other major vascular complication, unilateral acute lung injury or pulmonary edema, phrenic nerve dysfunction or paralysis, rib fracture, chronic pain, lung hernia, or lymphocele. All of his questions have been answered.    I spent in excess of 15 minutes during the conduct of this office consultation and >50% of this time involved direct face-to-face encounter with the patient for counseling and/or coordination of their care.   Valentina Gu. Roxy Manns, MD 05/07/2020 12:30 PM

## 2020-05-07 NOTE — Progress Notes (Signed)
Surgical Instructions    Your procedure is scheduled on Wednesday 05/09/2020.  Report to [facility] Main Entrance "A" at 06:30 A.M., then check in with the Admitting office.  Call this number if you have problems or questions between now and the morning of surgery:  [phone]    Remember:  Do not eat or drink after midnight the night before your surgery    Take these medicines the morning of surgery with A SIP OF WATER: Acetaminophen (tylenol) - if needed Albuterol (Preventil/Ventolin) Inhaler - if needed (Please bring all inhalers with you the day of surgery) Atorvastatin (Lipitor) Fluticasone (Flonase) - if needed Loratadine (Claritin) - if needed Mometasone (Asmanex) inhaler Toprol-XL  As of today, STOP taking any Aspirin (unless otherwise instructed by your surgeon) Aleve, Naproxen, Ibuprofen, Motrin, Advil, Goody's, BC's, all herbal medications, fish oil, and all vitamins.   WHAT DO I DO ABOUT MY DIABETES MEDICATION?  . HOLD Jardiance the day before surgery.  Last dose will be on Monday 05/07/20.  . Do not take oral diabetes medicines (pills) the morning of surgery. - DO NOT take Metformin (Glucophage), Pioglitazone (Actos), or Repaglinide (Prandin) the morning of surgery.    HOW TO MANAGE YOUR DIABETES BEFORE AND AFTER SURGERY  Why is it important to control my blood sugar before and after surgery? . Improving blood sugar levels before and after surgery helps healing and can limit problems. . A way of improving blood sugar control is eating a healthy diet by: o  Eating less sugar and carbohydrates o  Increasing activity/exercise o  Talking with your doctor about reaching your blood sugar goals . High blood sugars (greater than 180 mg/dL) can raise your risk of infections and slow your recovery, so you will need to focus on controlling your diabetes during the weeks before surgery. . Make sure that the doctor who takes care of your diabetes knows about your planned  surgery including the date and location.  How do I manage my blood sugar before surgery? . Check your blood sugar at least 4 times a day, starting 2 days before surgery, to make sure that the level is not too high or low. . Check your blood sugar the morning of your surgery when you wake up and every 2 hours until you get to the Short Stay unit. o If your blood sugar is less than 70 mg/dL, you will need to treat for low blood sugar: - Do not take insulin. - Treat a low blood sugar (less than 70 mg/dL) with  cup of clear juice (cranberry or apple), 4 glucose tablets, OR glucose gel. - Recheck blood sugar in 15 minutes after treatment (to make sure it is greater than 70 mg/dL). If your blood sugar is not greater than 70 mg/dL on recheck, call [phone] for further instructions. . Report your blood sugar to the short stay nurse when you get to Short Stay.  . If you are admitted to the hospital after surgery: o Your blood sugar will be checked by the staff and you will probably be given insulin after surgery (instead of oral diabetes medicines) to make sure you have good blood sugar levels. o The goal for blood sugar control after surgery is 80-180 mg/dL.                      Do not wear jewelry            Do not wear lotions, powders, colognes, or deodorant.  Do not shave 48 hours prior to surgery.  Men may shave face and neck.            Do not bring valuables to the hospital.            [facility] is not responsible for any belongings or valuables.  Do NOT Smoke (Tobacco/Vaping) or drink Alcohol 24 hours prior to your procedure  If you use a CPAP at night, you may bring all equipment for your overnight stay.   Contacts, glasses, hearing aids, dentures or partials may not be worn into surgery, please bring cases for these belongings   For patients admitted to the hospital, discharge time will be determined by your treatment team.   Patients discharged the day of surgery  will not be allowed to drive home, and someone needs to stay with them for 24 hours.    Special instructions:   [place]- Preparing For Surgery  Before surgery, you can play an important role. Because skin is not sterile, your skin needs to be as free of germs as possible. You can reduce the number of germs on your skin by washing with CHG (chlorahexidine gluconate) Soap before surgery.  CHG is an antiseptic cleaner which kills germs and bonds with the skin to continue killing germs even after washing.    Oral Hygiene is also important to reduce your risk of infection.  Remember - BRUSH YOUR TEETH THE MORNING OF SURGERY WITH YOUR REGULAR TOOTHPASTE  Please do not use if you have an allergy to CHG or antibacterial soaps. If your skin becomes reddened/irritated stop using the CHG.  Do not shave (including legs and underarms) for at least 48 hours prior to first CHG shower. It is OK to shave your face.  Please follow these instructions carefully.   1. If you chose to wash your hair, wash your hair first as usual with your normal shampoo.  2. After you shampoo, rinse your hair and body thoroughly to remove the shampoo.  3. Wash Face and genitals (private parts) with your normal soap.   4. THEN Shower the NIGHT BEFORE SURGERY and the MORNING OF SURGERY with CHG Soap.   5. Use CHG as you would any other liquid soap. You can apply CHG directly to the skin and wash gently with a scrungie or a clean washcloth.   6. Apply the CHG Soap to your body ONLY FROM THE NECK DOWN.  Do not use on open wounds or open sores. Avoid contact with your eyes, ears, mouth and genitals (private parts). Wash Face and genitals (private parts)  with your normal soap.   7. Wash thoroughly, paying special attention to the area where your surgery will be performed.  8. Thoroughly rinse your body with warm water from the neck down.  9. DO NOT shower/wash with your normal soap after using and rinsing off the CHG  Soap.  10. Pat yourself dry with a CLEAN TOWEL.  11. Wear CLEAN PAJAMAS to bed the night before surgery  12. Place CLEAN SHEETS on your bed the night before your surgery  13. DO NOT SLEEP WITH PETS.   Day of Surgery: Shower with CHG soap as directed Wear Clean/Comfortable clothing the morning of surgery Do not apply any deodorants/lotions.   Remember to brush your teeth WITH YOUR REGULAR TOOTHPASTE.   Please read over the following fact sheets that you were given.

## 2020-05-07 NOTE — H&P (Signed)
[address]       [place] [address]             [id number]          CARDIOTHORACIC SURGERY HISTORY AND PHYSICAL EXAM  Referring Provider is Jettie Booze, MD PCP is Orpah Melter, MD  Chief Complaint  Patient presents with  . 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  Patient surgical excision of a lipoma in 2015 and developed severe skin infection.  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  He was noted to have moderate mitral regurgitation without signs of congestive heart failure and seen in consultation by Dr. Servando Snare at that time.  He was treated medically and did well and has been followed carefully ever since by Dr. Irish Lack.  Approximately 1 year ago he was diagnosed with colon cancer and underwent left colectomy.  Final pathology revealed nodal metastases (stage IIIc, T4N2A moderately differentiated adenocarcinoma) and he was treated with postoperative adjuvant chemotherapy using 5-fluorouracil which he completed in May of this year.  Most recent follow-up CT scan of the abdomen and pelvis performed October 2021 revealed no sign of residual disease.  Routine follow-up transthoracic  echocardiogram performed January 26, 2020 revealed severe mitral regurgitation with normal left ventricular systolic function but slightly enlarged left ventricular chamber size.  The patient was seen in follow-up with Dr. Irish Lack and subsequently underwent TEE and diagnostic cardiac catheterization on February 10, 2020.  TEE confirmed the presence of severe mitral regurgitation with an obvious flail segment involving a portion of the posterior leaflet and an eccentric jet of mitral regurgitation.  Left ventricular function appeared normal with ejection fraction estimated 60 to 65%.  Diagnostic cardiac catheterization revealed normal coronary artery anatomy with no significant coronary artery disease.  Pulmonary artery pressures were normal.  Abdominal aortogram revealed no significant atherosclerotic disease.  Cardiothoracic surgical consultation was requested.  Patient is married and lives locally in [place] with his wife.  He works for [employer].  He remains quite active physically and exercises on a regular basis.  He specifically denies any symptoms of exertional shortness of breath or chest discomfort.  He has not noticed any significant change in his exercise tolerance although he admits that he does not push himself terribly hard when he exercises.  He has not had palpitations, dizzy spells, nor syncope.  Patient was originally seen in consultation on February 15, 2020.  Since then he has had problems with recurrent urinary tract infections which are felt to be related to his new medication for diabetes, Jardiance.  He completed a course of oral antibiotics last week and reports no ongoing symptoms of dysuria or urgency.  He otherwise reports no significant problems or complaints and he specifically denies any symptoms of shortness of breath.   Past Medical History:  Diagnosis Date  . Allergic  rhinitis   . Asthma    animal dander & seasonal asthma  . Colon cancer ([place]) 01/26/2019  .  Diabetes mellitus without complication ([place])    dx 2010  type 2  . Discitis of lumbosacral region    first started with this ..and rolled onto the endocarditis    stayed a month  . 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 Family history of colon cancer   . Family history of thyroid cancer   . H/O scoliosis   . 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 History of hiatal hernia   . Hyperlipidemia   . Hypertension    Diagnostic exercise tolerance test assessment:04/30/2010 : comments normal -no evidence os ischemia by ST analysis  . lipoma   . Lipoma 01/26/2019  . Mitral regurgitation    Echo 12/2018: EF 60-65, myxomatous mitral valve, severe mitral regurgitation, partial flail leaflet of posterior mitral valve, myxomatous tricuspid valve with trivial TR, ascending aorta mildly dilated (39 mm)  . Pneumonia   . PONV (postoperative nausea and vomiting)   . Solitary kidney   . 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 ABDOMINAL AORTOGRAM N/A 02/10/2020   Procedure: ABDOMINAL AORTOGRAM;  Surgeon: Jettie Booze, MD;  Location: [facility];  Service: Cardiovascular;  Laterality: N/A;  . BIOPSY  01/07/2019   Procedure: BIOPSY;  Surgeon: Wilford Corner, MD;  Location: [facility];  Service: Endoscopy;;  . BIOPSY  01/03/2020   Procedure: BIOPSY;  Surgeon: Wilford Corner, MD;  Location: [facility];  Service: Endoscopy;;  . COLON SURGERY  03/04/2019   left colon segmental resection  . COLONOSCOPY WITH PROPOFOL N/A 01/07/2019   Procedure: COLONOSCOPY WITH PROPOFOL;  Surgeon: Wilford Corner, MD;  Location: [facility];  Service: Endoscopy;  Laterality: N/A;  . COLONOSCOPY WITH PROPOFOL N/A 01/03/2020   Procedure: COLONOSCOPY WITH PROPOFOL;  Surgeon: Wilford Corner, MD;  Location: [facility];  Service: Endoscopy;  Laterality: N/A;  . INGUINAL HERNIA REPAIR Left  04/23/2016   Procedure: LAPAROSCOPIC  INGUINAL REPAIR;  Surgeon: Clovis Riley, MD;  Location: [facility];  Service: General;  Laterality: Left;  . KNEE SURGERY     arthroscopic left knee  . LIPOMA EXCISION  2015   x20 [facility]  . POLYPECTOMY  01/03/2020   Procedure: POLYPECTOMY;  Surgeon: Wilford Corner, MD;  Location: [facility];  Service: Endoscopy;;  . PORTACATH PLACEMENT N/A 03/16/2019   Procedure: INSERTION PORT-A-CATH;  Surgeon: Michael Boston, MD;  Location: [facility];  Service: General;  Laterality: N/A;  . RIGHT/LEFT HEART CATH AND CORONARY ANGIOGRAPHY N/A 02/10/2020   Procedure: RIGHT/LEFT HEART CATH AND CORONARY ANGIOGRAPHY;  Surgeon: Jettie Booze, MD;  Location: [facility];  Service: Cardiovascular;  Laterality: N/A;  . SUBMUCOSAL INJECTION  01/07/2019   Procedure: SUBMUCOSAL INJECTION;  Surgeon: Wilford Corner, MD;  [facility];  Service: Endoscopy;;  . TEE WITHOUT CARDIOVERSION N/A 02/10/2020   Procedure: TRANSESOPHAGEAL ECHOCARDIOGRAM (TEE);  Surgeon: Acie Fredrickson Wonda Cheng, MD;  Location[facility];  Service: Cardiovascular;  Laterality: N/A;  CATH AFTER TEE  . THYROIDECTOMY, PARTIAL  01/05/2009   Right Thyroid Lobectomy  . TONSILLECTOMY    . TOTAL NEPHRECTOMY Left 1970   Wilms Tumor left kidney excised as an infant    Family History  Problem Relation Age of Onset  . Heart disease Father   . Hernia  Father   . Healthy Sister   . Stroke Paternal Grandmother   . Cancer Mother        THYROID  . Hypotension Mother   . Heart attack Maternal Grandfather   . Congestive Heart Failure Maternal Grandfather   . Heart attack Paternal Grandfather   . Colon cancer Maternal Aunt 79  . Congestive Heart Failure Paternal Uncle   . Congestive Heart Failure Maternal Grandmother   . Colon cancer Other 1       MGMs brother  . Colon cancer Other        MGFs brother    Social History Social History   Tobacco Use  . Smoking status: Never Smoker  .  Smokeless tobacco: Never Used  Vaping Use  . Vaping Use: Never used  Substance Use Topics  . Alcohol use: Yes    Alcohol/week: 1.0 standard drink    Types: 1 Glasses of wine per week    Comment: occasional  . Drug use: No    Prior to Admission medications   Medication Sig Start Date End Date Taking? Authorizing Provider  acetaminophen (TYLENOL) 500 MG tablet Take 500-1,000 mg by mouth every 6 (six) hours as needed for mild pain or headache.   Yes [provider]  albuterol (PROVENTIL HFA;VENTOLIN HFA) 108 (90 BASE) MCG/ACT inhaler Inhale 1 puff into the lungs every 6 (six) hours as needed for wheezing or shortness of breath.   Yes [provider]  atorvastatin (LIPITOR) 10 MG tablet Take 1 tablet (10 mg total) by mouth daily. 03/15/14  Yes Angiulli, Lavon Paganini, PA-C  calcium carbonate (TUMS - DOSED IN MG ELEMENTAL CALCIUM) 500 MG chewable tablet Chew 500 mg by mouth daily as needed for indigestion or heartburn.    Yes [provider]  fluticasone (FLONASE) 50 MCG/ACT nasal spray Place 1 spray into both nostrils daily as needed for allergies or rhinitis.   Yes [provider]  JARDIANCE 10 MG TABS tablet Take 10 mg by mouth daily. 04/19/20  Yes [provider]  lisinopril (PRINIVIL,ZESTRIL) 2.5 MG tablet Take 1 tablet (2.5 mg total) by mouth daily. 03/15/14  Yes Angiulli, Lavon Paganini, PA-C  loratadine (CLARITIN) 10 MG tablet Take 10 mg by mouth daily as needed for allergies.   Yes [provider]  metFORMIN (GLUCOPHAGE) 1000 MG tablet Take 1 tablet (1,000 mg total) by mouth 2 (two) times daily. 02/12/20  Yes Jettie Booze, MD  mometasone [facility]) 220 MCG/INH inhaler Inhale 1 puff into the lungs daily.   Yes [provider]  Omega-3 Fatty Acids (FISH OIL) 1000 MG CAPS Take 1,000 mg by mouth daily.    Yes [provider]  ONE TOUCH ULTRA TEST test strip 1 each by Other route as needed (GLUCOSE).  05/06/15  Yes [provider]  pioglitazone (ACTOS) 15 MG tablet Take 15 mg by mouth daily. 11/17/19  Yes [provider]  repaglinide (PRANDIN) 1 MG tablet Take 1 mg by mouth 2 (two) times daily before a meal.    Yes [provider]  TOPROL XL 25 MG 24 hr tablet Take 25 mg by mouth daily.  07/07/14  Yes [provider]    No Known Allergies    Review of Systems:              General:                      Normal appetite, normal energy, no  weight gain, no weight loss, no fever             Cardiac:                       no chest pain with exertion, no chest pain at rest, no SOB with exertion, no resting SOB, no PND, no orthopnea, no palpitations, no arrhythmia, no atrial fibrillation, no LE edema, no dizzy spells, no syncope             Respiratory:                 no shortness of breath, no home oxygen, no productive cough, no dry cough, no bronchitis, no wheezing, no hemoptysis, no asthma, no pain with inspiration or cough, no sleep apnea, no CPAP at night             GI:                               no difficulty swallowing, no reflux, no frequent heartburn, no hiatal hernia, no abdominal pain, no constipation, no diarrhea, no hematochezia, no hematemesis, no melena             GU:                              no dysuria,  no frequency, no urinary tract infection, no hematuria, no enlarged prostate, no kidney stones, no kidney disease             Vascular:                     no pain suggestive of claudication, no pain in feet, no leg cramps, no varicose veins, no DVT, no non-healing foot ulcer             Neuro:                         no stroke, no TIA's, no seizures, no headaches, no temporary blindness one eye,  no slurred speech, no peripheral neuropathy, no chronic pain, no instability of gait, no memory/cognitive dysfunction             Musculoskeletal:         no arthritis, no joint swelling, no myalgias, no difficulty walking, no mobility              Skin:                             no rash, no itching, no skin infections, no pressure sores or ulcerations             Psych:                         no anxiety, no depression, no nervousness, no unusual recent stress             Eyes:                           no blurry vision, no floaters, no recent vision changes, + wears glasses for reading only             ENT:  no hearing loss, no loose or painful teeth, no dentures, last saw dentist within the past year             Hematologic:               no easy bruising, no abnormal bleeding, no clotting disorder, no frequent epistaxis             Endocrine:                   + diabetes, does check CBG's at home                           Physical Exam:              BP (!) 158/91 (BP Location: Left Arm, Patient Position: Sitting)   Pulse 93   Resp 18   Ht 6' (1.829 m)   Wt 193 lb (87.5 kg)   SpO2 94% Comment: RA  BMI 26.18 kg/m              General:                        well-appearing             HEENT:                       Unremarkable              Neck:                           no JVD, no bruits, no adenopathy              Chest:                          clear to auscultation, symmetrical breath sounds, no wheezes, no rhonchi              CV:                              RRR, grade IV/VI holosystolic murmur best at apex             Abdomen:                    soft, non-tender, no masses              Extremities:                 warm, well-perfused, pulses palpable, no LE edema             Rectal/GU                   Deferred             Neuro:                         Grossly non-focal and symmetrical throughout             Skin:                            Clean and dry, no rashes, no breakdown   Diagnostic Tests:  ECHOCARDIOGRAM REPORT  Patient Name:  ELISANDRO WYKA Date of Exam: 01/26/2020  Medical Rec #: [id number]    Height:    72.0 in  Accession #:  [id number]    Weight:    188.5 lb  Date of  Birth: [birth year]    BSA:     2.078 m  Patient Age:  71 years     BP:      140/87 mmHg  Patient Gender: M        HR:      66 bpm.  Exam Location: [facility]   Procedure: 2D Echo, 3D Echo, Cardiac Doppler and Color Doppler   Indications:  I34.0 Mitral regurgitation    History:    Patient has prior history of Echocardiogram examinations,  most         recent 01/20/2019. Colon cancer and Stroke; Risk         Factors:Hypertension and Diabetes.    Sonographer:  Marygrace Drought RCS  Referring Phys: Jettie Booze   IMPRESSIONS    1. There is severe eccentric mitral regurgitation. There is a flail P2  segment. There is reversal of flow in the R upper pulmonary vein. 2D PISA  (PISA radius 1.6 cm with aliasing velocity 30 cm/s) with angle correction  performed demonstrates ERO 0.54  cm2 and Rvol 94 cc. LVIDd is now 5.4 cm (was 5.0 cm) and LVIDs is now 3.5  cm (was 3.1 cm) indicative of progression of LV volume overload. TEE is  recommended for better characterization. The mitral valve is myxomatous.  Severe mitral valve regurgitation.  No evidence of mitral stenosis.  2. Left ventricular ejection fraction, by estimation, is 65 to 70%. Left  ventricular ejection fraction by 3D volume is 66 %. The left ventricle has  normal function. The left ventricle has no regional wall motion  abnormalities. Left ventricular diastolic  parameters were normal.  3. Right ventricular systolic function is normal. The right ventricular  size is normal. There is normal pulmonary artery systolic pressure. The  estimated right ventricular systolic pressure is [id number] mmHg.  4. Left atrial size was mildly dilated.  5. The aortic valve is tricuspid. Aortic valve regurgitation is not  visualized. No aortic stenosis is present.  6. The inferior vena cava is normal in size with greater than 50%  respiratory variability, suggesting right  atrial pressure of 3 mmHg.   Comparison(s): Changes from prior study are noted. Severe MR is still  present. LV dimenstions have enlarged from prior study.   FINDINGS  Left Ventricle: Left ventricular ejection fraction, by estimation, is 65  to 70%. Left ventricular ejection fraction by 3D volume is 66 %. The left  ventricle has normal function. The left ventricle has no regional wall  motion abnormalities. The left  ventricular internal cavity size was normal in size. There is no left  ventricular hypertrophy. Left ventricular diastolic parameters were  normal.   Right Ventricle: The right ventricular size is normal. No increase in  right ventricular wall thickness. Right ventricular systolic function is  normal. There is normal pulmonary artery systolic pressure. The tricuspid  regurgitant velocity is 2.05 m/s, and  with an assumed right atrial pressure of 3 mmHg, the estimated right  ventricular systolic pressure is [id number] mmHg.   Left Atrium: Left atrial size was mildly dilated.   Right Atrium: Right atrial size was normal in size.   Pericardium: There is no evidence of pericardial effusion.   Mitral Valve: There is severe eccentric mitral regurgitation.  There is a  flail P2 segment. There is reversal of flow in the R upper pulmonary vein.  2D PISA (PISA radius 1.6 cm with aliasing velocity 30 cm/s) with angle  correction performed demonstrates  ERO 0.54 cm2 and Rvol 94 cc. LVIDd is now 5.4 cm (was 5.0 cm) and LVIDs is  now 3.5 cm (was 3.1 cm) indicative of progression of LV volume overload.  TEE is recommended for better characterization. The mitral valve is  myxomatous. Severe mitral valve  regurgitation, with anteriorly-directed jet. No evidence of mitral valve  stenosis.   Tricuspid Valve: The tricuspid valve is grossly normal. Tricuspid valve  regurgitation is trivial. No evidence of tricuspid stenosis.   Aortic Valve: The aortic valve is tricuspid. Aortic valve  regurgitation is  not visualized. No aortic stenosis is present.   Pulmonic Valve: The pulmonic valve was grossly normal. Pulmonic valve  regurgitation is trivial. No evidence of pulmonic stenosis.   Aorta: The aortic root and ascending aorta are structurally normal, with  no evidence of dilitation.   Venous: The inferior vena cava is normal in size with greater than 50%  respiratory variability, suggesting right atrial pressure of 3 mmHg.   IAS/Shunts: The atrial septum is grossly normal.     LEFT VENTRICLE  PLAX 2D  LVIDd:     5.40 cm     Diastology  LVIDs:     3.50 cm     LV e' medial:  6.74 cm/s  LV PW:     1.30 cm     LV E/e' medial: 17.2  LV IVS:    1.00 cm     LV e' lateral:  11.00 cm/s  LVOT diam:   2.30 cm     LV E/e' lateral: 10.5  LV SV:     81  LV SV Index:  39  LVOT Area:   4.15 cm    3D Volume EF                 LV 3D EF:  Left                       ventricular                       ejection                       fraction by                       3D volume                       is 66 %.                   3D Volume EF:                 3D EF:    66 %   RIGHT VENTRICLE  RV Basal diam: 4.30 cm  RV S prime:   14.80 cm/s  TAPSE (M-mode): 2.6 cm  RVSP:      19.8 mmHg   LEFT ATRIUM       Index    RIGHT ATRIUM      Index  LA diam:    3.90 cm 1.88 cm/m RA Pressure: 3.00 mmHg  LA Vol (A2C):  91.8 ml 44.19 ml/m RA Area:   15.50 cm  LA Vol (  A4C):  59.0 ml 28.40 ml/m RA Volume:  40.00 ml 19.25 ml/m  LA Biplane Vol: 79.3 ml 38.17 ml/m  AORTIC VALVE  LVOT Vmax:  94.60 cm/s  LVOT Vmean: 68.600 cm/s  LVOT VTI:  0.195 m    AORTA  Ao Root diam: 3.20 cm  Ao Asc diam: 3.60 cm   MITRAL VALVE          TRICUSPID VALVE  MV Area (PHT):        TR Peak grad:  16.8 mmHg  MV Decel Time:        TR Vmax:    205.00 cm/s  MR Peak grad:  104.9 mmHg Estimated RAP: 3.00 mmHg  MR Mean grad:  68.0 mmHg  RVSP:      19.8 mmHg  MR Vmax:     512.00 cm/s  MR Vmean:    391.0 cm/s SHUNTS  MR PISA:     2.26 cm  Systemic VTI: 0.20 m  MR PISA Eff ROA: 14 mm   Systemic Diam: 2.30 cm  MR PISA Radius: 0.60 cm  MV E velocity: 116.00 cm/s  MV A velocity: 90.00 cm/s  MV E/A ratio: 1.29   Eleonore Chiquito MD  Electronically signed by Eleonore Chiquito MD  Signature Date/Time: 01/26/2020/11:09:06 AM        TRANSESOPHOGEAL ECHO REPORT       Patient Name:  Fabiola Backer Date of Exam: 02/10/2020  Medical Rec #: [id number]    Height:    72.0 in  Accession #:  [id number]    Weight:    185.0 lb  Date of Birth: [birth year]    BSA:     2.061 m  Patient Age:  50 years     BP:      146/84 mmHg  Patient Gender: M        HR:      73 bpm.  Exam Location: Inpatient   Procedure: Transesophageal Echo, 3D Echo, Color Doppler and Cardiac  Doppler   Indications:   I34.0 Nonrheumatic mitral (valve) insufficiency    History:     Patient has prior history of Echocardiogram examinations,  most          recent 01/26/2020. Risk Factors:Hypertension, Diabetes  and          Dyslipidemia.    Sonographer:   Raquel Sarna Senior RDCS  Referring Phys: [place]  Diagnosing Phys: [name] MD   PROCEDURE: After discussion of the risks and benefits of a TEE, an  informed consent was obtained from the patient. The transesophogeal probe  was passed without difficulty through the esophogus of the patient. Local  oropharyngeal anesthetic was provided  with Cetacaine. Sedation performed by different physician. The patient was  monitored while under deep sedation. Anesthestetic sedation  was provided  intravenously by Anesthesiology: 239mg  of Propofol, 40mg  of Lidocaine. The  patient developed no  complications during the procedure.   IMPRESSIONS    1. Left ventricular ejection fraction, by estimation, is 60 to 65%. The  left ventricle has normal function.  2. Right ventricular systolic function is normal. The right ventricular  size is normal.  3. No left atrial/left atrial appendage thrombus was detected.  4. Moderate flail of posterior leaflet.  5. The MR jet is very eccentric and the degree if MR is difficult to  quatify. . The mitral valve is abnormal. Moderate to severe mitral valve  regurgitation.  6. The aortic valve is normal in structure. Aortic valve regurgitation  is  not visualized. No aortic stenosis is present.   FINDINGS  Left Ventricle: Left ventricular ejection fraction, by estimation, is 60  to 65%. The left ventricle has normal function. The left ventricular  internal cavity size was normal in size.   Right Ventricle: The right ventricular size is normal. No increase in  right ventricular wall thickness. Right ventricular systolic function is  normal.   Left Atrium: Left atrial size was normal in size. No left atrial/left  atrial appendage thrombus was detected.   Right Atrium: Right atrial size was normal in size.   Pericardium: There is no evidence of pericardial effusion.   Mitral Valve: The MR jet is very eccentric and the degree if MR is  difficult to quatify. The mitral valve is abnormal. Moderate flail of the  middle scallop of the posterior mitral valve leaflet. Moderate to severe  mitral valve regurgitation. Pulmonary  venous flow is normal.   Tricuspid Valve: The tricuspid valve is normal in structure. Tricuspid  valve regurgitation is trivial.   Aortic Valve: The aortic valve is normal in structure. Aortic valve  regurgitation is not visualized. No aortic stenosis is present.   Pulmonic Valve: The pulmonic valve was  normal in structure. Pulmonic valve  regurgitation is trivial.   Aorta: The aortic root and ascending aorta are structurally normal, with  no evidence of dilitation. There is minimal (Grade I) plaque.   IAS/Shunts: No atrial level shunt detected by color flow Doppler.     MR Peak grad:  50.7 mmHg  MR Mean grad:  34.0 mmHg  MR Vmax:     356.00 cm/s  MR Vmean:    277.0 cm/s  MR PISA:     12.32 cm  MR PISA Eff ROA: 133 mm  MR PISA Radius: 1.40 cm   [name] MD  Electronically signed by [name] MD  Signature Date/Time: 02/10/2020/2:11:00 PM       ABDOMINAL AORTOGRAM  RIGHT/LEFT HEART CATH AND CORONARY ANGIOGRAPHY  Conclusion    The left ventricular systolic function is normal.  LV end diastolic pressure is normal.  The left ventricular ejection fraction is 55-65% by visual estimate.  There is moderate (3+) mitral regurgitation.  There is no aortic valve stenosis.  No angiographically apparent CAD.  Ao sat 96%, PA sat 67% on room air; PA pressure 22/7, mean PA 15 mm Hg; mean PCWP 8 mm Hg; CO 5 L/min; CI 2.4.  Normal right heart pressures.  No AAA. Patent right renal artery. Patient iliofemoral system bilaterally.  Severe, eccentric MR by TEE. Will get consultation for possible repair of mitral valve.  Surgeon Notes    02/10/2020 8:50 AM CV Procedure addendum by Acie Fredrickson Wonda Cheng, MD  Indications  Severe mitral regurgitation [I34.0 (ICD-10-CM)]  Procedural Details  Technical Details The risks, benefits, and details of the procedure were explained to the patient. The patient verbalized understanding and wanted to proceed. Informed written consent was obtained.  PROCEDURE TECHNIQUE: After Xylocaine anesthesia, a 5 French sheath was placed in the right antecubital area in exchange for a peripheral IV. A 5 French balloontipped Swan-Ganz catheter was advanced to the pulmonary artery under fluoroscopic guidance.  Hemodynamic pressures were obtained. Oxygen saturations were obtained. After Xylocaine anesthesia, a 81F sheath was placed in the right radial artery with a single anterior needle wall stick. Left heart cath and Right coronary angiography were done using a Judkins R4 guide catheter. Left coronary angiography was done using a Judkins L4 guide catheter.  LV gram and abdominal aortogram were done using a pigtail catheter. TR band for hemostasis.     Contrast: 130 cc  Estimated blood loss <50 mL.   During this procedure medications were administered to achieve and maintain moderate conscious sedation while the patient's heart rate, blood pressure, and oxygen saturation were continuously monitored and I was present face-to-face 100% of this time.  Medications (Filter: Administrations occurring from 0900 to 1016 on 02/10/20) (important) Continuous medications are totaled by the amount administered until 02/10/20 1016.  Heparin (Porcine) in NaCl 1000-0.9 UT/500ML-% SOLN (mL) Total volume:  1,000 mL Date/Time  Rate/Dose/Volume Action  02/10/20 0909  500 mL Given  0909  500 mL Given    fentaNYL (SUBLIMAZE) injection (mcg) Total dose:  25 mcg Date/Time  Rate/Dose/Volume Action  02/10/20 0913  25 mcg Given    midazolam (VERSED) injection (mg) Total dose:  2 mg Date/Time  Rate/Dose/Volume Action  02/10/20 0913  2 mg Given    lidocaine (PF) (XYLOCAINE) 1 % injection (mL) Total volume:  4 mL Date/Time  Rate/Dose/Volume Action  02/10/20 0930  2 mL Given  0932  2 mL Given    Radial Cocktail/Verapamil only (mL) Total volume:  10 mL Date/Time  Rate/Dose/Volume Action  02/10/20 0934  10 mL Given    heparin sodium (porcine) injection (Units) Total dose:  4,500 Units Date/Time  Rate/Dose/Volume Action  02/10/20 0943  4,500 Units Given    iohexol (OMNIPAQUE) 350 MG/ML injection (mL) Total volume:  130 mL Date/Time  Rate/Dose/Volume Action  02/10/20 1004  130 mL Given     Sedation Time  Sedation Time Physician-1: 51 minutes 18 seconds  Contrast  Medication Name Total Dose  iohexol (OMNIPAQUE) 350 MG/ML injection 130 mL    Radiation/Fluoro  Fluoro time: 7.3 (min) DAP: 10.9 (mGycm2) Cumulative Air Kerma: 151 (mGy)  Coronary Findings  Diagnostic Dominance: Right Left Main  Vessel was injected. Vessel is normal in caliber. Vessel is angiographically normal.  Left Anterior Descending  Vessel was injected. Vessel is normal in caliber. Vessel is angiographically normal.  Left Circumflex  Vessel was injected. Vessel is normal in caliber. Vessel is angiographically normal.  Right Coronary Artery  Vessel was injected. Vessel is normal in caliber. Vessel is angiographically normal.  Intervention  No interventions have been documented. Right Heart  Right Heart Pressures Ao sat 96%, PA sat 67% on room air; PA pressure 22/7, mean PA 15 mm Hg; mean PCWP 8 mm Hg; CO 5 L/min; CI 2.4  Wall Motion       Resting               Left Heart  Left Ventricle The left ventricular size is normal. The left ventricular systolic function is normal. LV end diastolic pressure is normal. The left ventricular ejection fraction is 55-65% by visual estimate. No regional wall motion abnormalities. There is moderate (3+) mitral regurgitation.  Aortic Valve There is no aortic valve stenosis.  Aorta Abdominal Aorta: The abdominal aorta is normal in size. The abdominal aorta is not aneurysmal.   Patent right renal artery. Patient iliofemoral system bilaterally.  Coronary Diagrams  Diagnostic Dominance: Right  Intervention  Implants      No implant documentation for this case.  Syngo Images  Show images for CARDIAC CATHETERIZATION Images on [employer] Storage  Show images for Madox[employer], Hankes "[place] [facility]" Link to Procedure Log  Procedure Log    Hemo Data   Most Recent Value  Fick Cardiac Output 5 L/min  Fick Cardiac Output Index 2.43  (L/min)/BSA  RA A Wave 4 mmHg  RA V Wave 3 mmHg  RA Mean 1 mmHg  RV Systolic Pressure 23 mmHg  RV Diastolic Pressure 0 mmHg  RV EDP 5 mmHg  PA Systolic Pressure 22 mmHg  PA Diastolic Pressure 7 mmHg  PA Mean 15 mmHg  PW A Wave 13 mmHg  PW V Wave 14 mmHg  PW Mean 8 mmHg  AO Systolic Pressure 99 mmHg  AO Diastolic Pressure 61 mmHg  AO Mean 77 mmHg  LV Systolic Pressure [id number] mmHg  LV Diastolic Pressure 1 mmHg  LV EDP 13 mmHg  AOp Systolic Pressure [id number] mmHg  AOp Diastolic Pressure 66 mmHg  AOp Mean Pressure 85 mmHg  LVp Systolic Pressure [id number] mmHg  LVp Diastolic Pressure 5 mmHg  LVp EDP Pressure 16 mmHg  QP/QS 1  TPVR Index 6.19 HRUI  TSVR Index 32.16 HRUI  PVR SVR Ratio 0.09  TPVR/TSVR Ratio 0.19     CT CHEST, ABDOMEN, AND PELVIS WITH CONTRAST  TECHNIQUE: Multidetector CT imaging of the chest, abdomen and pelvis was performed following the standard protocol during bolus administration of intravenous contrast.  CONTRAST: 156mL OMNIPAQUE IOHEXOL 300 MG/ML SOLN  COMPARISON: CT chest dated 06/08/2019. CT abdomen/pelvis dated 01/08/2019.  FINDINGS: CT CHEST FINDINGS  Cardiovascular: Heart is normal in size. No pericardial effusion.  No evidence of thoracic aortic aneurysm. Mild atherosclerotic calcifications of the aortic arch.  Mediastinum/Nodes: No suspicious mediastinal lymphadenopathy.  Status post right thyroidectomy.  Lungs/Pleura: Again seen are numerous pleural-based/subpleural nodules in the lungs bilaterally, many of which are triangular subpleural lymph nodes, measuring up to 4-5 mm. These are unchanged from October 2020 and can be considered benign at this point, despite the patient's clinical history.  No new/suspicious pulmonary nodules.  Scattered areas of bronchiectasis in the bilateral upper lobes (for example series 6/image 62). No focal consolidation.  No pleural effusion or pneumothorax.  Musculoskeletal: No focal  osseous lesions.  CT ABDOMEN PELVIS FINDINGS  Hepatobiliary: Tiny hepatic cysts measuring up to 7 mm in the left hepatic lobe (series 2/image 53).  Gallbladder is unremarkable. No intrahepatic or extrahepatic ductal dilatation.  Pancreas: Within normal limits.  Spleen: Rounded, heterogeneously enhancing central splenic mass measuring 12.1 x 9.4 cm (series 2/image 63), unchanged from priors. This appears to have been present on 2015 lumbar spine MRI, favoring a benign etiology such as splenic hamartoma or hemangioma.  Adrenals/Urinary Tract: Bilateral adrenal glands are within normal limits.  Status post left nephrectomy.  Right kidney is within normal limits. No hydronephrosis.  Thick-walled bladder, although underdistended.  Stomach/Bowel: Stomach is within normal limits.  No evidence of bowel obstruction.  Normal appendix (series 2/image 93).  Status post partial colon resection with suture line in the central lower abdomen (series 2/image 34).  No colonic wall thickening or mass is evident on CT.  Vascular/Lymphatic: No evidence of abdominal aortic aneurysm.  Atherosclerotic calcifications of the abdominal aorta and branch vessels.  No suspicious abdominopelvic lymphadenopathy.  Reproductive: Prostate is grossly unremarkable.  Other: No abdominopelvic ascites.  Tiny fat containing right inguinal hernia.  Musculoskeletal: Scattered fat lobules along the subcutaneous wall bilaterally (for example series 2/image 99 and 110), more conspicuous than on priors, but presumably still benign.  Degenerative changes of the lumbar spine.  IMPRESSION: Status post partial colon resection. No findings suspicious for recurrent or metastatic disease.  Numerous bilateral pulmonary nodules are unchanged x 12 months, compatible with benign subpleural lymph nodes. Fleischner Society  guidelines do not apply, but these are not considered suspicious  for metastases, and do not require additional dedicated follow-up.  Prior right thyroidectomy and left nephrectomy.  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  Additional ancillary findings as above.   Electronically Signed By: Julian Hy M.D. On: 01/09/2020 14:43      Impression:  Patient has mitral valve prolapse with stage C severe asymptomatic primary mitral regurgitation.He remains physically active and has not experienced any symptoms of exertional shortness of breath, orthopnea, chest pain or palpitations.  I have personally reviewed the patient's recent multiple echocardiograms, diagnostic cardiac catheterization, and CT angiograms. 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 There is type II dysfunction with no obvious sign of leaflet perforation or other complicating features that might have been related to his history of bacterial endocarditis. Left ventricular function remains normal. There may be some slight left ventricular chamber enlargement. Diagnostic cardiac catheterization reveals normal coronary artery anatomy with no significant coronary artery disease. Right heart pressures were normal.  CT scans revealed no contraindications to peripheral cannulation for surgery.  Options include elective mitral valve repair versus continued close observation on medical therapy. Based upon review of the patient's transesophageal echocardiogram I feel there is greater than 95% likelihood of successful and durable mitral valve repair with anticipated operative risks well below 1%. Patient appears to be a good candidate for minimally invasive approach for surgery.   Plan:  The patient was again counseled at  length regarding the indications, risks and potential benefits of mitral valve repair. The rationale for elective surgery has been explained, including a comparison between surgery and continued medical therapy with close follow-up. The likelihood of successful and durable mitral valve repair has been discussed with particular reference to the findings of hisrecent echocardiogram. Based upon these findings and previous experience, I have quoted him a greater than95percent likelihood of successful valve repair with less than 1percent risk of mortality or major morbidity. Alternative surgical approaches have been discussed including a comparison between conventional sternotomy and minimally-invasive techniques. The relative risks and benefits of each have been reviewed as they pertain to the patient's specific circumstances, and expectations for the patient's postoperative convalescence havebeen discussed.    The patient understands and accepts all potential risks of surgery including but not limited to risk of death, stroke or other neurologic complication, myocardial infarction, congestive heart failure, respiratory failure, renal failure, bleeding requiring transfusion and/or reexploration, arrhythmia, heart block or bradycardia requiring permanent pacemaker insertion, infection or other wound complications, pneumonia, pleural and/or pericardial effusion, pulmonary embolus, aortic dissection or other major vascular complication, or other immediate or delayed complications related to valve repair or replacement including but not limited to recurrent or persistent mitral regurgitation and/or mitral stenosis, LV outflow tract obstruction, aortic insufficiency, paravalvular leak, posterior AV groove disruption, structural valve deterioration and failure, thrombosis, embolization, or endocarditis.  Specific risks potentially related to the minimally-invasive approach were discussed at length, including but  not limited to risk of conversion to full or partial sternotomy, aortic dissection or other major vascular complication, unilateral acute lung injury or pulmonary edema, phrenic nerve dysfunction or paralysis, rib fracture, chronic pain, lung hernia, or lymphocele. All of his questions have been answered.      Valentina Gu. Roxy Manns, MD 05/07/2020 12:30 PM

## 2020-05-08 MED ORDER — TRANEXAMIC ACID (OHS) PUMP PRIME SOLUTION
2.0000 mg/kg | INTRAVENOUS | Status: DC
  Filled 2020-05-08: qty 1.75

## 2020-05-08 MED ORDER — VANCOMYCIN HCL 1000 MG IV SOLR
INTRAVENOUS | Status: DC
  Filled 2020-05-08: qty 1000

## 2020-05-08 MED ORDER — GLUTARALDEHYDE 0.625% SOAKING SOLUTION
TOPICAL | Status: DC
  Filled 2020-05-08: qty 50

## 2020-05-08 MED ORDER — DEXMEDETOMIDINE HCL IN NACL 400 MCG/100ML IV SOLN
0.1000 ug/kg/h | INTRAVENOUS | Status: AC
  Administered 2020-05-09: .4 ug/kg/h via INTRAVENOUS
  Filled 2020-05-08: qty 100

## 2020-05-08 MED ORDER — INSULIN REGULAR(HUMAN) IN NACL 100-0.9 UT/100ML-% IV SOLN
INTRAVENOUS | Status: AC
  Administered 2020-05-09: 3.2 [IU]/h via INTRAVENOUS
  Filled 2020-05-08: qty 100

## 2020-05-08 MED ORDER — SODIUM CHLORIDE 0.9 % IV SOLN
750.0000 mg | INTRAVENOUS | Status: AC
  Administered 2020-05-09: 750 mg via INTRAVENOUS
  Filled 2020-05-08: qty 750

## 2020-05-08 MED ORDER — NOREPINEPHRINE 4 MG/250ML-% IV SOLN
0.0000 ug/min | INTRAVENOUS | Status: DC
  Filled 2020-05-08: qty 250

## 2020-05-08 MED ORDER — PHENYLEPHRINE HCL-NACL 20-0.9 MG/250ML-% IV SOLN
30.0000 ug/min | INTRAVENOUS | Status: AC
  Administered 2020-05-09: 25 ug/min via INTRAVENOUS
  Filled 2020-05-08: qty 250

## 2020-05-08 MED ORDER — TRANEXAMIC ACID (OHS) BOLUS VIA INFUSION
15.0000 mg/kg | INTRAVENOUS | Status: AC
  Administered 2020-05-09: 1312.5 mg via INTRAVENOUS
  Filled 2020-05-08: qty 1313

## 2020-05-08 MED ORDER — TRANEXAMIC ACID 1000 MG/10ML IV SOLN
1.5000 mg/kg/h | INTRAVENOUS | Status: AC
  Administered 2020-05-09: 1.5 mg/kg/h via INTRAVENOUS
  Filled 2020-05-08: qty 25

## 2020-05-08 MED ORDER — PLASMA-LYTE 148 IV SOLN
INTRAVENOUS | Status: DC
  Filled 2020-05-08: qty 2.5

## 2020-05-08 MED ORDER — SODIUM CHLORIDE 0.9 % IV SOLN
1.5000 g | INTRAVENOUS | Status: AC
  Administered 2020-05-09: 1.5 g via INTRAVENOUS
  Filled 2020-05-08: qty 1.5

## 2020-05-08 MED ORDER — EPINEPHRINE HCL 5 MG/250ML IV SOLN IN NS
0.0000 ug/min | INTRAVENOUS | Status: DC
  Filled 2020-05-08: qty 250

## 2020-05-08 MED ORDER — MILRINONE LACTATE IN DEXTROSE 20-5 MG/100ML-% IV SOLN
0.3000 ug/kg/min | INTRAVENOUS | Status: DC
  Filled 2020-05-08: qty 100

## 2020-05-08 MED ORDER — POTASSIUM CHLORIDE 2 MEQ/ML IV SOLN
80.0000 meq | INTRAVENOUS | Status: DC
  Filled 2020-05-08: qty 40

## 2020-05-08 MED ORDER — VANCOMYCIN HCL 1250 MG/250ML IV SOLN
1250.0000 mg | INTRAVENOUS | Status: AC
  Administered 2020-05-09: 1250 mg via INTRAVENOUS
  Filled 2020-05-08: qty 250

## 2020-05-08 MED ORDER — NITROGLYCERIN IN D5W 200-5 MCG/ML-% IV SOLN
2.0000 ug/min | INTRAVENOUS | Status: DC
  Filled 2020-05-08: qty 250

## 2020-05-08 MED ORDER — SODIUM CHLORIDE 0.9 % IV SOLN
INTRAVENOUS | Status: DC
  Filled 2020-05-08: qty 30

## 2020-05-08 MED ORDER — MANNITOL 20 % IV SOLN
Freq: Once | INTRAVENOUS | Status: DC
  Filled 2020-05-08: qty 13

## 2020-05-08 NOTE — H&P (Signed)
[address]       [place] [address]             [phone]          CARDIOTHORACIC SURGERY HISTORY AND PHYSICAL EXAM  Referring Provider is Jettie Booze, MD PCP is Orpah Melter, MD  Chief Complaint  Patient presents with  . 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  Patient surgical excision of a lipoma in 2015 and developed severe skin infection.  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  He was noted to have moderate mitral regurgitation without signs of congestive heart failure and seen in consultation by Dr. Servando Snare at that time.  He was treated medically and did well and has been followed carefully ever since by Dr. Irish Lack.  Approximately 1 year ago he was diagnosed with colon cancer and underwent left colectomy.  Final pathology revealed nodal metastases (stage IIIc, T4N2A moderately differentiated adenocarcinoma) and he was treated with postoperative adjuvant chemotherapy using 5-fluorouracil which he completed in May of this year.  Most recent follow-up CT scan of the abdomen and pelvis performed October 2021 revealed no sign of residual disease.  Routine follow-up transthoracic  echocardiogram performed January 26, 2020 revealed severe mitral regurgitation with normal left ventricular systolic function but slightly enlarged left ventricular chamber size.  The patient was seen in follow-up with Dr. Irish Lack and subsequently underwent TEE and diagnostic cardiac catheterization on February 10, 2020.  TEE confirmed the presence of severe mitral regurgitation with an obvious flail segment involving a portion of the posterior leaflet and an eccentric jet of mitral regurgitation.  Left ventricular function appeared normal with ejection fraction estimated 60 to 65%.  Diagnostic cardiac catheterization revealed normal coronary artery anatomy with no significant coronary artery disease.  Pulmonary artery pressures were normal.  Abdominal aortogram revealed no significant atherosclerotic disease.  Cardiothoracic surgical consultation was requested.  Patient is married and lives locally in [place] with his wife.  He works for [employer].  He remains quite active physically and exercises on a regular basis.  He specifically denies any symptoms of exertional shortness of breath or chest discomfort.  He has not noticed any significant change in his exercise tolerance although he admits that he does not push himself terribly hard when he exercises.  He has not had palpitations, dizzy spells, nor syncope.  Patient returns for follow-up today with tentative plans to proceed with elective mitral valve repair later this week.  Patient was originally seen in consultation on February 15, 2020.  Since then he has had problems with recurrent urinary tract infections which are felt to be related to his new medication for diabetes, Jardiance.  He completed a course of oral antibiotics last week and reports no ongoing symptoms of dysuria or urgency.  He otherwise reports no significant problems or complaints and he  specifically denies any symptoms of shortness of breath.   Past Medical History:   Diagnosis Date  . Allergic rhinitis   . Asthma    animal dander & seasonal asthma  . Colon cancer ([place]) 01/26/2019  . Diabetes mellitus without complication ([place])    dx 2010  type 2  . Discitis of lumbosacral region    first started with this ..and rolled onto the endocarditis    stayed a month  . 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 Family history of colon cancer   . Family history of thyroid cancer   . H/O scoliosis   . 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 History of hiatal hernia   . Hyperlipidemia   . Hypertension    Diagnostic exercise tolerance test assessment:04/30/2010 : comments normal -no evidence os ischemia by ST analysis  . lipoma   . Lipoma 01/26/2019  . Mitral regurgitation    Echo 12/2018: EF 60-65, myxomatous mitral valve, severe mitral regurgitation, partial flail leaflet of posterior mitral valve, myxomatous tricuspid valve with trivial TR, ascending aorta mildly dilated (39 mm)  . Pneumonia    as child  . PONV (postoperative nausea and vomiting)   . Solitary kidney   . 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 ABDOMINAL AORTOGRAM N/A 02/10/2020   Procedure: ABDOMINAL AORTOGRAM;  Surgeon: Jettie Booze, MD;  Location: [facility];  Service: Cardiovascular;  Laterality: N/A;  . BIOPSY  01/07/2019   Procedure: BIOPSY;  Surgeon: Wilford Corner, MD;  Location: [facility];  Service: Endoscopy;;  . BIOPSY  01/03/2020   Procedure: BIOPSY;  Surgeon: Wilford Corner, MD;  Location: [facility];  Service: Endoscopy;;  . COLON SURGERY  03/04/2019   left colon segmental resection  . COLONOSCOPY WITH PROPOFOL N/A 01/07/2019   Procedure: COLONOSCOPY WITH PROPOFOL;  Surgeon: Wilford Corner, MD;  Location: [facility];  Service: Endoscopy;  Laterality: N/A;  . COLONOSCOPY WITH PROPOFOL N/A 01/03/2020   Procedure: COLONOSCOPY WITH  PROPOFOL;  Surgeon: Wilford Corner, MD;  Location: [facility];  Service: Endoscopy;  Laterality: N/A;  . INGUINAL HERNIA REPAIR Left 04/23/2016   Procedure: LAPAROSCOPIC  INGUINAL REPAIR;  Surgeon: Clovis Riley, MD;  Location: [facility];  Service: General;  Laterality: Left;  . KNEE SURGERY     arthroscopic left knee  . LIPOMA EXCISION  2015   x20 [facility]  . POLYPECTOMY  01/03/2020   Procedure: POLYPECTOMY;  Surgeon: Wilford Corner, MD;  Location: [facility];  Service: Endoscopy;;  . PORT-A-CATH REMOVAL    . PORTACATH PLACEMENT N/A 03/16/2019   Procedure: INSERTION PORT-A-CATH;  Surgeon: Michael Boston, MD;  Location: [facility];  Service: General;  Laterality: N/A;  . RIGHT/LEFT HEART CATH AND CORONARY ANGIOGRAPHY N/A 02/10/2020   Procedure: RIGHT/LEFT HEART CATH AND CORONARY ANGIOGRAPHY;  Surgeon: Jettie Booze, MD;  Location: [facility];  Service: Cardiovascular;  Laterality: N/A;  . SUBMUCOSAL INJECTION  01/07/2019   Procedure: SUBMUCOSAL INJECTION;  Surgeon: Wilford Corner, MD;  Location: [facility];  Service: Endoscopy;;  . TEE WITHOUT CARDIOVERSION N/A 02/10/2020   Procedure: TRANSESOPHAGEAL ECHOCARDIOGRAM (TEE);  Surgeon: Acie Fredrickson Wonda Cheng, MD;  Location: [facility];  Service: Cardiovascular;  Laterality: N/A;  CATH AFTER TEE  . THYROIDECTOMY, PARTIAL  01/05/2009   Right Thyroid Lobectomy  . TONSILLECTOMY    . TOTAL NEPHRECTOMY Left 1970  Wilms Tumor left kidney excised as an infant    Family History  Problem Relation Age of Onset  . Heart disease Father   . Hernia Father   . Healthy Sister   . Stroke Paternal Grandmother   . Cancer Mother        THYROID  . Hypotension Mother   . Heart attack Maternal Grandfather   . Congestive Heart Failure Maternal Grandfather   . Heart attack Paternal Grandfather   . Colon cancer Maternal Aunt 79  . Congestive Heart Failure Paternal Uncle   . Congestive Heart Failure Maternal Grandmother   . Colon cancer Other  23       MGMs brother  . Colon cancer Other        MGFs brother    Social History Social History   Tobacco Use  . Smoking status: Never Smoker  . Smokeless tobacco: Never Used  Vaping Use  . Vaping Use: Never used  Substance Use Topics  . Alcohol use: Yes    Alcohol/week: 1.0 standard drink    Types: 1 Glasses of wine per week    Comment: occasional  . Drug use: No    Prior to Admission medications   Medication Sig Start Date End Date Taking? Authorizing Provider  acetaminophen (TYLENOL) 500 MG tablet Take 500-1,000 mg by mouth every 6 (six) hours as needed for mild pain or headache.   Yes [provider]  albuterol (PROVENTIL HFA;VENTOLIN HFA) 108 (90 BASE) MCG/ACT inhaler Inhale 1 puff into the lungs every 6 (six) hours as needed for wheezing or shortness of breath.   Yes [provider]  atorvastatin (LIPITOR) 10 MG tablet Take 1 tablet (10 mg total) by mouth daily. 03/15/14  Yes Angiulli, Lavon Paganini, PA-C  calcium carbonate (TUMS - DOSED IN MG ELEMENTAL CALCIUM) 500 MG chewable tablet Chew 500 mg by mouth daily as needed for indigestion or heartburn.    Yes [provider]  fluticasone (FLONASE) 50 MCG/ACT nasal spray Place 1 spray into both nostrils daily as needed for allergies or rhinitis.   Yes [provider]  JARDIANCE 10 MG TABS tablet Take 10 mg by mouth daily. 04/19/20  Yes [provider]  lisinopril (PRINIVIL,ZESTRIL) 2.5 MG tablet Take 1 tablet (2.5 mg total) by mouth daily. 03/15/14  Yes Angiulli, Lavon Paganini, PA-C  loratadine (CLARITIN) 10 MG tablet Take 10 mg by mouth daily as needed for allergies.   Yes [provider]  metFORMIN (GLUCOPHAGE) 1000 MG tablet Take 1 tablet (1,000 mg total) by mouth 2 (two) times daily. 02/12/20  Yes Jettie Booze, MD  mometasone [facility]) 220 MCG/INH inhaler Inhale 1 puff into the lungs daily.   Yes [provider]  Omega-3 Fatty Acids (FISH OIL) 1000 MG CAPS Take  1,000 mg by mouth daily.    Yes [provider]  ONE TOUCH ULTRA TEST test strip 1 each by Other route as needed (GLUCOSE).  05/06/15  Yes [provider]  pioglitazone (ACTOS) 15 MG tablet Take 15 mg by mouth daily. 11/17/19  Yes [provider]  repaglinide (PRANDIN) 1 MG tablet Take 1 mg by mouth 2 (two) times daily before a meal.    Yes [provider]  TOPROL XL 25 MG 24 hr tablet Take 25 mg by mouth daily.  07/07/14  Yes [provider]    No Known Allergies     Review of Systems:  General:                      Normal appetite, normal energy, no weight gain, no weight loss, no fever             Cardiac:                       no chest pain with exertion, no chest pain at rest, no SOB with exertion, no resting SOB, no PND, no orthopnea, no palpitations, no arrhythmia, no atrial fibrillation, no LE edema, no dizzy spells, no syncope             Respiratory:                 no shortness of breath, no home oxygen, no productive cough, no dry cough, no bronchitis, no wheezing, no hemoptysis, no asthma, no pain with inspiration or cough, no sleep apnea, no CPAP at night             GI:                               no difficulty swallowing, no reflux, no frequent heartburn, no hiatal hernia, no abdominal pain, no constipation, no diarrhea, no hematochezia, no hematemesis, no melena             GU:                              no dysuria,  no frequency, no urinary tract infection, no hematuria, no enlarged prostate, no kidney stones, no kidney disease             Vascular:                     no pain suggestive of claudication, no pain in feet, no leg cramps, no varicose veins, no DVT, no non-healing foot ulcer             Neuro:                         no stroke, no TIA's, no seizures, no headaches, no temporary blindness one eye,  no slurred speech, no peripheral neuropathy, no chronic pain, no instability of gait, no memory/cognitive  dysfunction             Musculoskeletal:         no arthritis, no joint swelling, no myalgias, no difficulty walking, no mobility              Skin:                            no rash, no itching, no skin infections, no pressure sores or ulcerations             Psych:                         no anxiety, no depression, no nervousness, no unusual recent stress             Eyes:                           no blurry vision, no floaters, no recent vision changes, +  wears glasses for reading only             ENT:                            no hearing loss, no loose or painful teeth, no dentures, last saw dentist within the past year             Hematologic:               no easy bruising, no abnormal bleeding, no clotting disorder, no frequent epistaxis             Endocrine:                   + diabetes, does check CBG's at home                           Physical Exam:              BP (!) 158/91 (BP Location: Left Arm, Patient Position: Sitting)   Pulse 93   Resp 18   Ht 6' (1.829 m)   Wt 193 lb (87.5 kg)   SpO2 94% Comment: RA  BMI 26.18 kg/m              General:                        well-appearing             HEENT:                       Unremarkable              Neck:                           no JVD, no bruits, no adenopathy              Chest:                          clear to auscultation, symmetrical breath sounds, no wheezes, no rhonchi              CV:                              RRR, grade IV/VI holosystolic murmur best at apex             Abdomen:                    soft, non-tender, no masses              Extremities:                 warm, well-perfused, pulses palpable, no LE edema             Rectal/GU                   Deferred             Neuro:                         Grossly non-focal and symmetrical throughout  Skin:                            Clean and dry, no rashes, no breakdown   Diagnostic Tests:  ECHOCARDIOGRAM REPORT       Patient Name:   JONAVEN HILGERS Date of Exam: 01/26/2020  Medical Rec #: [id number]    Height:    72.0 in  Accession #:  [id number]    Weight:    188.5 lb  Date of Birth: 06-20-1965    BSA:     2.078 m  Patient Age:  12 years     BP:      140/87 mmHg  Patient Gender: M        HR:      66 bpm.  Exam Location: [facility]   Procedure: 2D Echo, 3D Echo, Cardiac Doppler and Color Doppler   Indications:  I34.0 Mitral regurgitation    History:    Patient has prior history of Echocardiogram examinations,  most         recent 01/20/2019. Colon cancer and Stroke; Risk         Factors:Hypertension and Diabetes.    Sonographer:  Marygrace Drought RCS  Referring Phys: Jettie Booze   IMPRESSIONS    1. There is severe eccentric mitral regurgitation. There is a flail P2  segment. There is reversal of flow in the R upper pulmonary vein. 2D PISA  (PISA radius 1.6 cm with aliasing velocity 30 cm/s) with angle correction  performed demonstrates ERO 0.54  cm2 and Rvol 94 cc. LVIDd is now 5.4 cm (was 5.0 cm) and LVIDs is now 3.5  cm (was 3.1 cm) indicative of progression of LV volume overload. TEE is  recommended for better characterization. The mitral valve is myxomatous.  Severe mitral valve regurgitation.  No evidence of mitral stenosis.  2. Left ventricular ejection fraction, by estimation, is 65 to 70%. Left  ventricular ejection fraction by 3D volume is 66 %. The left ventricle has  normal function. The left ventricle has no regional wall motion  abnormalities. Left ventricular diastolic  parameters were normal.  3. Right ventricular systolic function is normal. The right ventricular  size is normal. There is normal pulmonary artery systolic pressure. The  estimated right ventricular systolic pressure is 48.5 mmHg.  4. Left atrial size was mildly dilated.  5. The aortic valve is tricuspid. Aortic valve regurgitation  is not  visualized. No aortic stenosis is present.  6. The inferior vena cava is normal in size with greater than 50%  respiratory variability, suggesting right atrial pressure of 3 mmHg.   Comparison(s): Changes from prior study are noted. Severe MR is still  present. LV dimenstions have enlarged from prior study.   FINDINGS  Left Ventricle: Left ventricular ejection fraction, by estimation, is 65  to 70%. Left ventricular ejection fraction by 3D volume is 66 %. The left  ventricle has normal function. The left ventricle has no regional wall  motion abnormalities. The left  ventricular internal cavity size was normal in size. There is no left  ventricular hypertrophy. Left ventricular diastolic parameters were  normal.   Right Ventricle: The right ventricular size is normal. No increase in  right ventricular wall thickness. Right ventricular systolic function is  normal. There is normal pulmonary artery systolic pressure. The tricuspid  regurgitant velocity is 2.05 m/s, and  with an assumed right atrial pressure of 3 mmHg, the estimated right  ventricular systolic pressure is 50.3 mmHg.   Left Atrium: Left atrial size was mildly dilated.   Right Atrium: Right atrial size was normal in size.   Pericardium: There is no evidence of pericardial effusion.   Mitral Valve: There is severe eccentric mitral regurgitation. There is a  flail P2 segment. There is reversal of flow in the R upper pulmonary vein.  2D PISA (PISA radius 1.6 cm with aliasing velocity 30 cm/s) with angle  correction performed demonstrates  ERO 0.54 cm2 and Rvol 94 cc. LVIDd is now 5.4 cm (was 5.0 cm) and LVIDs is  now 3.5 cm (was 3.1 cm) indicative of progression of LV volume overload.  TEE is recommended for better characterization. The mitral valve is  myxomatous. Severe mitral valve  regurgitation, with anteriorly-directed jet. No evidence of mitral valve  stenosis.   Tricuspid Valve: The tricuspid valve  is grossly normal. Tricuspid valve  regurgitation is trivial. No evidence of tricuspid stenosis.   Aortic Valve: The aortic valve is tricuspid. Aortic valve regurgitation is  not visualized. No aortic stenosis is present.   Pulmonic Valve: The pulmonic valve was grossly normal. Pulmonic valve  regurgitation is trivial. No evidence of pulmonic stenosis.   Aorta: The aortic root and ascending aorta are structurally normal, with  no evidence of dilitation.   Venous: The inferior vena cava is normal in size with greater than 50%  respiratory variability, suggesting right atrial pressure of 3 mmHg.   IAS/Shunts: The atrial septum is grossly normal.     LEFT VENTRICLE  PLAX 2D  LVIDd:     5.40 cm     Diastology  LVIDs:     3.50 cm     LV e' medial:  6.74 cm/s  LV PW:     1.30 cm     LV E/e' medial: 17.2  LV IVS:    1.00 cm     LV e' lateral:  11.00 cm/s  LVOT diam:   2.30 cm     LV E/e' lateral: 10.5  LV SV:     81  LV SV Index:  39  LVOT Area:   4.15 cm    3D Volume EF                 LV 3D EF:  Left                       ventricular                       ejection                       fraction by                       3D volume                       is 66 %.                   3D Volume EF:                 3D EF:    66 %   RIGHT VENTRICLE  RV Basal diam: 4.30 cm  RV S prime:   14.80 cm/s  TAPSE (M-mode): 2.6 cm  RVSP:      19.8 mmHg   LEFT ATRIUM  Index    RIGHT ATRIUM      Index  LA diam:    3.90 cm 1.88 cm/m RA Pressure: 3.00 mmHg  LA Vol (A2C):  91.8 ml 44.19 ml/m RA Area:   15.50 cm  LA Vol (A4C):  59.0 ml 28.40 ml/m RA Volume:  40.00 ml 19.25 ml/m  LA Biplane Vol: 79.3 ml 38.17 ml/m  AORTIC VALVE  LVOT  Vmax:  94.60 cm/s  LVOT Vmean: 68.600 cm/s  LVOT VTI:  0.195 m    AORTA  Ao Root diam: 3.20 cm  Ao Asc diam: 3.60 cm   MITRAL VALVE         TRICUSPID VALVE  MV Area (PHT):        TR Peak grad:  16.8 mmHg  MV Decel Time:        TR Vmax:    205.00 cm/s  MR Peak grad:  104.9 mmHg Estimated RAP: 3.00 mmHg  MR Mean grad:  68.0 mmHg  RVSP:      19.8 mmHg  MR Vmax:     512.00 cm/s  MR Vmean:    391.0 cm/s SHUNTS  MR PISA:     2.26 cm  Systemic VTI: 0.20 m  MR PISA Eff ROA: 14 mm   Systemic Diam: 2.30 cm  MR PISA Radius: 0.60 cm  MV E velocity: 116.00 cm/s  MV A velocity: 90.00 cm/s  MV E/A ratio: 1.29   Eleonore Chiquito MD  Electronically signed by Eleonore Chiquito MD  Signature Date/Time: 01/26/2020/11:09:06 AM        TRANSESOPHOGEAL ECHO REPORT       Patient Name:  Fabiola Backer Date of Exam: 02/10/2020  Medical Rec #: [id number]    Height:    72.0 in  Accession #:  [id number]    Weight:    185.0 lb  Date of Birth: Nov 19, 1965    BSA:     2.061 m  Patient Age:  23 years     BP:      146/84 mmHg  Patient Gender: M        HR:      73 bpm.  Exam Location: Inpatient   Procedure: Transesophageal Echo, 3D Echo, Color Doppler and Cardiac  Doppler   Indications:   I34.0 Nonrheumatic mitral (valve) insufficiency    History:     Patient has prior history of Echocardiogram examinations,  most          recent 01/26/2020. Risk Factors:Hypertension, Diabetes  and          Dyslipidemia.    Sonographer:   Raquel Sarna Senior RDCS  Referring Phys: [place]  Diagnosing Phys: [name] MD   PROCEDURE: After discussion of the risks and benefits of a TEE, an  informed consent was obtained from the patient. The transesophogeal probe  was passed without difficulty through the esophogus of the patient. Local  oropharyngeal  anesthetic was provided  with Cetacaine. Sedation performed by different physician. The patient was  monitored while under deep sedation. Anesthestetic sedation was provided  intravenously by Anesthesiology: 239mg  of Propofol, 40mg  of Lidocaine. The  patient developed no  complications during the procedure.   IMPRESSIONS    1. Left ventricular ejection fraction, by estimation, is 60 to 65%. The  left ventricle has normal function.  2. Right ventricular systolic function is normal. The right ventricular  size is normal.  3. No left atrial/left atrial appendage thrombus was detected.  4. Moderate flail of posterior  leaflet.  5. The MR jet is very eccentric and the degree if MR is difficult to  quatify. . The mitral valve is abnormal. Moderate to severe mitral valve  regurgitation.  6. The aortic valve is normal in structure. Aortic valve regurgitation is  not visualized. No aortic stenosis is present.   FINDINGS  Left Ventricle: Left ventricular ejection fraction, by estimation, is 60  to 65%. The left ventricle has normal function. The left ventricular  internal cavity size was normal in size.   Right Ventricle: The right ventricular size is normal. No increase in  right ventricular wall thickness. Right ventricular systolic function is  normal.   Left Atrium: Left atrial size was normal in size. No left atrial/left  atrial appendage thrombus was detected.   Right Atrium: Right atrial size was normal in size.   Pericardium: There is no evidence of pericardial effusion.   Mitral Valve: The MR jet is very eccentric and the degree if MR is  difficult to quatify. The mitral valve is abnormal. Moderate flail of the  middle scallop of the posterior mitral valve leaflet. Moderate to severe  mitral valve regurgitation. Pulmonary  venous flow is normal.   Tricuspid Valve: The tricuspid valve is normal in structure. Tricuspid  valve regurgitation is trivial.   Aortic Valve:  The aortic valve is normal in structure. Aortic valve  regurgitation is not visualized. No aortic stenosis is present.   Pulmonic Valve: The pulmonic valve was normal in structure. Pulmonic valve  regurgitation is trivial.   Aorta: The aortic root and ascending aorta are structurally normal, with  no evidence of dilitation. There is minimal (Grade I) plaque.   IAS/Shunts: No atrial level shunt detected by color flow Doppler.     MR Peak grad:  50.7 mmHg  MR Mean grad:  34.0 mmHg  MR Vmax:     356.00 cm/s  MR Vmean:    277.0 cm/s  MR PISA:     12.32 cm  MR PISA Eff ROA: 133 mm  MR PISA Radius: 1.40 cm   [name] MD  Electronically signed by [name] MD  Signature Date/Time: 02/10/2020/2:11:00 PM        ABDOMINAL AORTOGRAM  RIGHT/LEFT HEART CATH AND CORONARY ANGIOGRAPHY  Conclusion    The left ventricular systolic function is normal.  LV end diastolic pressure is normal.  The left ventricular ejection fraction is 55-65% by visual estimate.  There is moderate (3+) mitral regurgitation.  There is no aortic valve stenosis.  No angiographically apparent CAD.  Ao sat 96%, PA sat 67% on room air; PA pressure 22/7, mean PA 15 mm Hg; mean PCWP 8 mm Hg; CO 5 L/min; CI 2.4.  Normal right heart pressures.  No AAA. Patent right renal artery. Patient iliofemoral system bilaterally.  Severe, eccentric MR by TEE. Will get consultation for possible repair of mitral valve.  Surgeon Notes    02/10/2020 8:50 AM CV Procedure addendum by Acie Fredrickson Wonda Cheng, MD  Indications  Severe mitral regurgitation [I34.0 (ICD-10-CM)]  Procedural Details  Technical Details The risks, benefits, and details of the procedure were explained to the patient. The patient verbalized understanding and wanted to proceed. Informed written consent was obtained.  PROCEDURE TECHNIQUE: After Xylocaine anesthesia, a 5 French sheath was placed in the right  antecubital area in exchange for a peripheral IV. A 5 French balloontipped Swan-Ganz catheter was advanced to the pulmonary artery under fluoroscopic guidance. Hemodynamic pressures were obtained. Oxygen saturations were obtained. After  Xylocaine anesthesia, a 85F sheath was placed in the right radial artery with a single anterior needle wall stick. Left heart cath and Right coronary angiography were done using a Judkins R4 guide catheter. Left coronary angiography was done using a Judkins L4 guide catheter. LV gram and abdominal aortogram were done using a pigtail catheter. TR band for hemostasis.     Contrast: 130 cc  Estimated blood loss <50 mL.   During this procedure medications were administered to achieve and maintain moderate conscious sedation while the patient's heart rate, blood pressure, and oxygen saturation were continuously monitored and I was present face-to-face 100% of this time.  Medications (Filter: Administrations occurring from 0900 to 1016 on 02/10/20) (important) Continuous medications are totaled by the amount administered until 02/10/20 1016.  Heparin (Porcine) in NaCl 1000-0.9 UT/500ML-% SOLN (mL) Total volume:  1,000 mL Date/Time  Rate/Dose/Volume Action  02/10/20 0909  500 mL Given  0909  500 mL Given    fentaNYL (SUBLIMAZE) injection (mcg) Total dose:  25 mcg Date/Time  Rate/Dose/Volume Action  02/10/20 0913  25 mcg Given    midazolam (VERSED) injection (mg) Total dose:  2 mg Date/Time  Rate/Dose/Volume Action  02/10/20 0913  2 mg Given    lidocaine (PF) (XYLOCAINE) 1 % injection (mL) Total volume:  4 mL Date/Time  Rate/Dose/Volume Action  02/10/20 0930  2 mL Given  0932  2 mL Given    Radial Cocktail/Verapamil only (mL) Total volume:  10 mL Date/Time  Rate/Dose/Volume Action  02/10/20 0934  10 mL Given    heparin sodium (porcine) injection (Units) Total dose:  4,500 Units Date/Time  Rate/Dose/Volume Action  02/10/20 0943   4,500 Units Given    iohexol (OMNIPAQUE) 350 MG/ML injection (mL) Total volume:  130 mL Date/Time  Rate/Dose/Volume Action  02/10/20 1004  130 mL Given    Sedation Time  Sedation Time Physician-1: 51 minutes 18 seconds  Contrast  Medication Name Total Dose  iohexol (OMNIPAQUE) 350 MG/ML injection 130 mL    Radiation/Fluoro  Fluoro time: 7.3 (min) DAP: 10.9 (mGycm2) Cumulative Air Kerma: 151 (mGy)  Coronary Findings  Diagnostic Dominance: Right Left Main  Vessel was injected. Vessel is normal in caliber. Vessel is angiographically normal.  Left Anterior Descending  Vessel was injected. Vessel is normal in caliber. Vessel is angiographically normal.  Left Circumflex  Vessel was injected. Vessel is normal in caliber. Vessel is angiographically normal.  Right Coronary Artery  Vessel was injected. Vessel is normal in caliber. Vessel is angiographically normal.  Intervention  No interventions have been documented. Right Heart  Right Heart Pressures Ao sat 96%, PA sat 67% on room air; PA pressure 22/7, mean PA 15 mm Hg; mean PCWP 8 mm Hg; CO 5 L/min; CI 2.4  Wall Motion       Resting               Left Heart  Left Ventricle The left ventricular size is normal. The left ventricular systolic function is normal. LV end diastolic pressure is normal. The left ventricular ejection fraction is 55-65% by visual estimate. No regional wall motion abnormalities. There is moderate (3+) mitral regurgitation.  Aortic Valve There is no aortic valve stenosis.  Aorta Abdominal Aorta: The abdominal aorta is normal in size. The abdominal aorta is not aneurysmal.   Patent right renal artery. Patient iliofemoral system bilaterally.  Coronary Diagrams  Diagnostic Dominance: Right  Intervention  Implants      No implant documentation for this case.  Syngo Images  Show images for CARDIAC CATHETERIZATION Images on Long Term Storage  Show images for Jaeger, [employer] "Merrilee Seashore" Link to Procedure Log  Procedure Log    Hemo Data   Most Recent Value  Fick Cardiac Output 5 L/min  Fick Cardiac Output Index 2.43 (L/min)/BSA  RA A Wave 4 mmHg  RA V Wave 3 mmHg  RA Mean 1 mmHg  RV Systolic Pressure 23 mmHg  RV Diastolic Pressure 0 mmHg  RV EDP 5 mmHg  PA Systolic Pressure 22 mmHg  PA Diastolic Pressure 7 mmHg  PA Mean 15 mmHg  PW A Wave 13 mmHg  PW V Wave 14 mmHg  PW Mean 8 mmHg  AO Systolic Pressure 99 mmHg  AO Diastolic Pressure 61 mmHg  AO Mean 77 mmHg  LV Systolic Pressure 102 mmHg  LV Diastolic Pressure 1 mmHg  LV EDP 13 mmHg  AOp Systolic Pressure 585 mmHg  AOp Diastolic Pressure 66 mmHg  AOp Mean Pressure 85 mmHg  LVp Systolic Pressure 277 mmHg  LVp Diastolic Pressure 5 mmHg  LVp EDP Pressure 16 mmHg  QP/QS 1  TPVR Index 6.19 HRUI  TSVR Index 32.16 HRUI  PVR SVR Ratio 0.09  TPVR/TSVR Ratio 0.19     CT CHEST, ABDOMEN, AND PELVIS WITH CONTRAST  TECHNIQUE: Multidetector CT imaging of the chest, abdomen and pelvis was performed following the standard protocol during bolus administration of intravenous contrast.  CONTRAST: 142mL OMNIPAQUE IOHEXOL 300 MG/ML SOLN  COMPARISON: CT chest dated 06/08/2019. CT abdomen/pelvis dated 01/08/2019.  FINDINGS: CT CHEST FINDINGS  Cardiovascular: Heart is normal in size. No pericardial effusion.  No evidence of thoracic aortic aneurysm. Mild atherosclerotic calcifications of the aortic arch.  Mediastinum/Nodes: No suspicious mediastinal lymphadenopathy.  Status post right thyroidectomy.  Lungs/Pleura: Again seen are numerous pleural-based/subpleural nodules in the lungs bilaterally, many of which are triangular subpleural lymph nodes, measuring up to 4-5 mm. These are unchanged from October 2020 and can be considered benign at this point, despite the patient's clinical history.  No new/suspicious pulmonary nodules.  Scattered areas of bronchiectasis in  the bilateral upper lobes (for example series 6/image 62). No focal consolidation.  No pleural effusion or pneumothorax.  Musculoskeletal: No focal osseous lesions.  CT ABDOMEN PELVIS FINDINGS  Hepatobiliary: Tiny hepatic cysts measuring up to 7 mm in the left hepatic lobe (series 2/image 53).  Gallbladder is unremarkable. No intrahepatic or extrahepatic ductal dilatation.  Pancreas: Within normal limits.  Spleen: Rounded, heterogeneously enhancing central splenic mass measuring 12.1 x 9.4 cm (series 2/image 63), unchanged from priors. This appears to have been present on 2015 lumbar spine MRI, favoring a benign etiology such as splenic hamartoma or hemangioma.  Adrenals/Urinary Tract: Bilateral adrenal glands are within normal limits.  Status post left nephrectomy.  Right kidney is within normal limits. No hydronephrosis.  Thick-walled bladder, although underdistended.  Stomach/Bowel: Stomach is within normal limits.  No evidence of bowel obstruction.  Normal appendix (series 2/image 93).  Status post partial colon resection with suture line in the central lower abdomen (series 2/image 34).  No colonic wall thickening or mass is evident on CT.  Vascular/Lymphatic: No evidence of abdominal aortic aneurysm.  Atherosclerotic calcifications of the abdominal aorta and branch vessels.  No suspicious abdominopelvic lymphadenopathy.  Reproductive: Prostate is grossly unremarkable.  Other: No abdominopelvic ascites.  Tiny fat containing right inguinal hernia.  Musculoskeletal: Scattered fat lobules along the subcutaneous wall bilaterally (for example series 2/image 99 and 110), more conspicuous than on  priors, but presumably still benign.  Degenerative changes of the lumbar spine.  IMPRESSION: Status post partial colon resection. No findings suspicious for recurrent or metastatic disease.  Numerous bilateral pulmonary nodules are  unchanged x 12 months, compatible with benign subpleural lymph nodes. Fleischner Society guidelines do not apply, but these are not considered suspicious for metastases, and do not require additional dedicated follow-up.  Prior right thyroidectomy and left nephrectomy.  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  Additional ancillary findings as above.   Electronically Signed By: Julian Hy M.D. On: 01/09/2020 14:43     Impression:  Patient has mitral valve prolapse with stage C severe asymptomatic primary mitral regurgitation.He remains physically active and has not experienced any symptoms of exertional shortness of breath, orthopnea, chest pain or palpitations.  I have personally reviewed the patient's recent multiple echocardiograms, diagnostic cardiac catheterization, and CT angiograms. 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 There is type II dysfunction with no obvious sign of leaflet perforation or other complicating features that might have been related to his history of bacterial endocarditis. Left ventricular function remains normal. There may be some slight left ventricular chamber enlargement. Diagnostic cardiac catheterization reveals normal coronary artery anatomy with no significant coronary artery disease. Right heart pressures were normal.  CT scans revealed no contraindications to peripheral cannulation for surgery.  Options include elective mitral valve repair versus continued close observation on medical therapy. Based upon review of the patient's transesophageal echocardiogram I feel there is greater than 95% likelihood of successful and durable mitral valve repair with anticipated operative risks well  below 1%. Patient appears to be a good candidate for minimally invasive approach for surgery.   Plan:  The patient was again counseled at length regarding the indications, risks and potential benefits of mitral valve repair. The rationale for elective surgery has been explained, including a comparison between surgery and continued medical therapy with close follow-up. The likelihood of successful and durable mitral valve repair has been discussed with particular reference to the findings of hisrecent echocardiogram. Based upon these findings and previous experience, I have quoted him a greater than95percent likelihood of successful valve repair with less than 1percent risk of mortality or major morbidity. Alternative surgical approaches have been discussed including a comparison between conventional sternotomy and minimally-invasive techniques. The relative risks and benefits of each have been reviewed as they pertain to the patient's specific circumstances, and expectations for the patient's postoperative convalescence havebeen discussed.    The patient understands and accepts all potential risks of surgery including but not limited to risk of death, stroke or other neurologic complication, myocardial infarction, congestive heart failure, respiratory failure, renal failure, bleeding requiring transfusion and/or reexploration, arrhythmia, heart block or bradycardia requiring permanent pacemaker insertion, infection or other wound complications, pneumonia, pleural and/or pericardial effusion, pulmonary embolus, aortic dissection or other major vascular complication, or other immediate or delayed complications related to valve repair or replacement including but not limited to recurrent or persistent mitral regurgitation and/or mitral stenosis, LV outflow tract obstruction, aortic insufficiency, paravalvular leak, posterior AV groove disruption, structural valve deterioration and failure,  thrombosis, embolization, or endocarditis.  Specific risks potentially related to the minimally-invasive approach were discussed at length, including but not limited to risk of conversion to full or partial sternotomy, aortic dissection or other major vascular complication, unilateral acute  lung injury or pulmonary edema, phrenic nerve dysfunction or paralysis, rib fracture, chronic pain, lung hernia, or lymphocele. All of his questions have been answered.     Valentina Gu. Roxy Manns, MD 05/07/2020 12:30 PM

## 2020-05-09 ENCOUNTER — Inpatient Hospital Stay (HOSPITAL_COMMUNITY): Payer: BC Managed Care – PPO | Admitting: Certified Registered Nurse Anesthetist

## 2020-05-09 ENCOUNTER — Inpatient Hospital Stay (HOSPITAL_COMMUNITY): Payer: BC Managed Care – PPO | Admitting: Physician Assistant

## 2020-05-09 ENCOUNTER — Inpatient Hospital Stay (HOSPITAL_COMMUNITY): Payer: BC Managed Care – PPO

## 2020-05-09 ENCOUNTER — Encounter (HOSPITAL_COMMUNITY)
Admission: RE | Disposition: A | Discharge status: Home / Self Care | Payer: Self-pay | Source: Ambulatory Visit | Attending: Thoracic Surgery (Cardiothoracic Vascular Surgery)

## 2020-05-09 ENCOUNTER — Other Ambulatory Visit: Payer: Self-pay

## 2020-05-09 ENCOUNTER — Encounter (HOSPITAL_COMMUNITY): Payer: Self-pay | Admitting: Thoracic Surgery (Cardiothoracic Vascular Surgery)

## 2020-05-09 ENCOUNTER — Inpatient Hospital Stay (HOSPITAL_COMMUNITY)
Admission: RE | Admit: 2020-05-09 | Discharge: 2020-05-14 | DRG: 219 | Disposition: A | Discharge status: Home / Self Care | Payer: BC Managed Care – PPO | Source: Ambulatory Visit | Attending: Thoracic Surgery (Cardiothoracic Vascular Surgery) | Admitting: Thoracic Surgery (Cardiothoracic Vascular Surgery)

## 2020-05-09 DIAGNOSIS — Z9221 Personal history of antineoplastic chemotherapy: Secondary | ICD-10-CM | POA: Diagnosis not present

## 2020-05-09 DIAGNOSIS — I340 Nonrheumatic mitral (valve) insufficiency: Principal | ICD-10-CM | POA: Diagnosis present

## 2020-05-09 DIAGNOSIS — Z20822 Contact with and (suspected) exposure to covid-19: Secondary | ICD-10-CM | POA: Diagnosis present

## 2020-05-09 DIAGNOSIS — Z8249 Family history of ischemic heart disease and other diseases of the circulatory system: Secondary | ICD-10-CM

## 2020-05-09 DIAGNOSIS — E785 Hyperlipidemia, unspecified: Secondary | ICD-10-CM | POA: Diagnosis present

## 2020-05-09 DIAGNOSIS — J9811 Atelectasis: Secondary | ICD-10-CM

## 2020-05-09 DIAGNOSIS — Z79899 Other long term (current) drug therapy: Secondary | ICD-10-CM

## 2020-05-09 DIAGNOSIS — Z85038 Personal history of other malignant neoplasm of large intestine: Secondary | ICD-10-CM

## 2020-05-09 DIAGNOSIS — D696 Thrombocytopenia, unspecified: Secondary | ICD-10-CM | POA: Diagnosis not present

## 2020-05-09 DIAGNOSIS — Z905 Acquired absence of kidney: Secondary | ICD-10-CM | POA: Diagnosis not present

## 2020-05-09 DIAGNOSIS — I081 Rheumatic disorders of both mitral and tricuspid valves: Secondary | ICD-10-CM | POA: Diagnosis not present

## 2020-05-09 DIAGNOSIS — I4891 Unspecified atrial fibrillation: Secondary | ICD-10-CM | POA: Diagnosis not present

## 2020-05-09 DIAGNOSIS — I511 Rupture of chordae tendineae, not elsewhere classified: Secondary | ICD-10-CM | POA: Diagnosis present

## 2020-05-09 DIAGNOSIS — J45909 Unspecified asthma, uncomplicated: Secondary | ICD-10-CM | POA: Diagnosis not present

## 2020-05-09 DIAGNOSIS — I10 Essential (primary) hypertension: Secondary | ICD-10-CM | POA: Diagnosis present

## 2020-05-09 DIAGNOSIS — Z800 Family history of malignant neoplasm of digestive organs: Secondary | ICD-10-CM | POA: Diagnosis not present

## 2020-05-09 DIAGNOSIS — Z85528 Personal history of other malignant neoplasm of kidney: Secondary | ICD-10-CM | POA: Diagnosis not present

## 2020-05-09 DIAGNOSIS — Z823 Family history of stroke: Secondary | ICD-10-CM

## 2020-05-09 DIAGNOSIS — Z7984 Long term (current) use of oral hypoglycemic drugs: Secondary | ICD-10-CM | POA: Diagnosis not present

## 2020-05-09 DIAGNOSIS — E8770 Fluid overload, unspecified: Secondary | ICD-10-CM | POA: Diagnosis not present

## 2020-05-09 DIAGNOSIS — Z9889 Other specified postprocedural states: Secondary | ICD-10-CM

## 2020-05-09 DIAGNOSIS — E119 Type 2 diabetes mellitus without complications: Secondary | ICD-10-CM | POA: Diagnosis not present

## 2020-05-09 DIAGNOSIS — Z87440 Personal history of urinary (tract) infections: Secondary | ICD-10-CM | POA: Diagnosis not present

## 2020-05-09 DIAGNOSIS — D62 Acute posthemorrhagic anemia: Secondary | ICD-10-CM | POA: Diagnosis not present

## 2020-05-09 HISTORY — DX: Other specified postprocedural states: Z98.890

## 2020-05-09 HISTORY — PX: TEE WITHOUT CARDIOVERSION: SHX5443

## 2020-05-09 HISTORY — PX: MITRAL VALVE REPAIR: SHX2039

## 2020-05-09 LAB — POCT I-STAT 7, (LYTES, BLD GAS, ICA,H+H)
Acid-base deficit: 2 mmol/L (ref 0.0–2.0)
Acid-base deficit: 2 mmol/L (ref 0.0–2.0)
Acid-base deficit: 3 mmol/L — ABNORMAL HIGH (ref 0.0–2.0)
Acid-base deficit: 3 mmol/L — ABNORMAL HIGH (ref 0.0–2.0)
Acid-base deficit: 6 mmol/L — ABNORMAL HIGH (ref 0.0–2.0)
Acid-base deficit: 6 mmol/L — ABNORMAL HIGH (ref 0.0–2.0)
Acid-base deficit: 5 mmol/L — ABNORMAL HIGH (ref 0.0–2.0)
Bicarbonate: 24.9 mmol/L (ref 20.0–28.0)
Bicarbonate: 23.6 mmol/L (ref 20.0–28.0)
Bicarbonate: 23.7 mmol/L (ref 20.0–28.0)
Bicarbonate: 24.4 mmol/L (ref 20.0–28.0)
Bicarbonate: 21.3 mmol/L (ref 20.0–28.0)
Bicarbonate: 20.2 mmol/L (ref 20.0–28.0)
Bicarbonate: 20.8 mmol/L (ref 20.0–28.0)
Calcium, Ion: 1.25 mmol/L (ref 1.15–1.40)
Calcium, Ion: 0.97 mmol/L — ABNORMAL LOW (ref 1.15–1.40)
Calcium, Ion: 1.1 mmol/L — ABNORMAL LOW (ref 1.15–1.40)
Calcium, Ion: 1.16 mmol/L (ref 1.15–1.40)
Calcium, Ion: 1.09 mmol/L — ABNORMAL LOW (ref 1.15–1.40)
Calcium, Ion: 1.14 mmol/L — ABNORMAL LOW (ref 1.15–1.40)
Calcium, Ion: 1.21 mmol/L (ref 1.15–1.40)
HCT: 40 % (ref 39.0–52.0)
HCT: 30 % — ABNORMAL LOW (ref 39.0–52.0)
HCT: 31 % — ABNORMAL LOW (ref 39.0–52.0)
HCT: 32 % — ABNORMAL LOW (ref 39.0–52.0)
HCT: 33 % — ABNORMAL LOW (ref 39.0–52.0)
HCT: 36 % — ABNORMAL LOW (ref 39.0–52.0)
HCT: 30 % — ABNORMAL LOW (ref 39.0–52.0)
Hemoglobin: 13.6 g/dL (ref 13.0–17.0)
Hemoglobin: 10.2 g/dL — ABNORMAL LOW (ref 13.0–17.0)
Hemoglobin: 10.5 g/dL — ABNORMAL LOW (ref 13.0–17.0)
Hemoglobin: 10.9 g/dL — ABNORMAL LOW (ref 13.0–17.0)
Hemoglobin: 11.2 g/dL — ABNORMAL LOW (ref 13.0–17.0)
Hemoglobin: 12.2 g/dL — ABNORMAL LOW (ref 13.0–17.0)
Hemoglobin: 10.2 g/dL — ABNORMAL LOW (ref 13.0–17.0)
O2 Saturation: 100 %
O2 Saturation: 100 %
O2 Saturation: 81 %
O2 Saturation: 100 %
O2 Saturation: 95 %
O2 Saturation: 96 %
O2 Saturation: 96 %
Patient temperature: 36.4
Patient temperature: 37.2
Potassium: 4.4 mmol/L (ref 3.5–5.1)
Potassium: 4.5 mmol/L (ref 3.5–5.1)
Potassium: 4.5 mmol/L (ref 3.5–5.1)
Potassium: 4.6 mmol/L (ref 3.5–5.1)
Potassium: 4.2 mmol/L (ref 3.5–5.1)
Potassium: 3.8 mmol/L (ref 3.5–5.1)
Potassium: 4.9 mmol/L (ref 3.5–5.1)
Sodium: 138 mmol/L (ref 135–145)
Sodium: 139 mmol/L (ref 135–145)
Sodium: 139 mmol/L (ref 135–145)
Sodium: 138 mmol/L (ref 135–145)
Sodium: 140 mmol/L (ref 135–145)
Sodium: 141 mmol/L (ref 135–145)
Sodium: 137 mmol/L (ref 135–145)
TCO2: 26 mmol/L (ref 22–32)
TCO2: 25 mmol/L (ref 22–32)
TCO2: 25 mmol/L (ref 22–32)
TCO2: 26 mmol/L (ref 22–32)
TCO2: 23 mmol/L (ref 22–32)
TCO2: 21 mmol/L — ABNORMAL LOW (ref 22–32)
TCO2: 22 mmol/L (ref 22–32)
pCO2 arterial: 52 mmHg — ABNORMAL HIGH (ref 32.0–48.0)
pCO2 arterial: 44.3 mmHg (ref 32.0–48.0)
pCO2 arterial: 50.9 mmHg — ABNORMAL HIGH (ref 32.0–48.0)
pCO2 arterial: 53.5 mmHg — ABNORMAL HIGH (ref 32.0–48.0)
pCO2 arterial: 49.8 mmHg — ABNORMAL HIGH (ref 32.0–48.0)
pCO2 arterial: 40.6 mmHg (ref 32.0–48.0)
pCO2 arterial: 41.6 mmHg (ref 32.0–48.0)
pH, Arterial: 7.288 — ABNORMAL LOW (ref 7.350–7.450)
pH, Arterial: 7.275 — ABNORMAL LOW (ref 7.350–7.450)
pH, Arterial: 7.335 — ABNORMAL LOW (ref 7.350–7.450)
pH, Arterial: 7.267 — ABNORMAL LOW (ref 7.350–7.450)
pH, Arterial: 7.238 — ABNORMAL LOW (ref 7.350–7.450)
pH, Arterial: 7.301 — ABNORMAL LOW (ref 7.350–7.450)
pH, Arterial: 7.307 — ABNORMAL LOW (ref 7.350–7.450)
pO2, Arterial: 522 mmHg — ABNORMAL HIGH (ref 83.0–108.0)
pO2, Arterial: 446 mmHg — ABNORMAL HIGH (ref 83.0–108.0)
pO2, Arterial: 51 mmHg — ABNORMAL LOW (ref 83.0–108.0)
pO2, Arterial: 359 mmHg — ABNORMAL HIGH (ref 83.0–108.0)
pO2, Arterial: 91 mmHg (ref 83.0–108.0)
pO2, Arterial: 88 mmHg (ref 83.0–108.0)
pO2, Arterial: 92 mmHg (ref 83.0–108.0)

## 2020-05-09 LAB — BASIC METABOLIC PANEL
Anion gap: 8 (ref 5–15)
BUN: 14 mg/dL (ref 6–20)
CO2: 18 mmol/L — ABNORMAL LOW (ref 22–32)
Calcium: 7.1 mg/dL — ABNORMAL LOW (ref 8.9–10.3)
Chloride: 109 mmol/L (ref 98–111)
Creatinine, Ser: 0.74 mg/dL (ref 0.61–1.24)
GFR, Estimated: >60 mL/min (ref >60)
Glucose, Bld: 124 mg/dL — ABNORMAL HIGH (ref 70–99)
Potassium: 4.7 mmol/L (ref 3.5–5.1)
Sodium: 135 mmol/L (ref 135–145)

## 2020-05-09 LAB — POCT I-STAT, CHEM 8
BUN: 17 mg/dL (ref 6–20)
BUN: 13 mg/dL (ref 6–20)
BUN: 15 mg/dL (ref 6–20)
BUN: 13 mg/dL (ref 6–20)
BUN: 13 mg/dL (ref 6–20)
Calcium, Ion: 1.26 mmol/L (ref 1.15–1.40)
Calcium, Ion: 1.09 mmol/L — ABNORMAL LOW (ref 1.15–1.40)
Calcium, Ion: 1.24 mmol/L (ref 1.15–1.40)
Calcium, Ion: 1.13 mmol/L — ABNORMAL LOW (ref 1.15–1.40)
Calcium, Ion: 1.17 mmol/L (ref 1.15–1.40)
Chloride: 104 mmol/L (ref 98–111)
Chloride: 104 mmol/L (ref 98–111)
Chloride: 105 mmol/L (ref 98–111)
Chloride: 106 mmol/L (ref 98–111)
Chloride: 106 mmol/L (ref 98–111)
Creatinine, Ser: 0.6 mg/dL — ABNORMAL LOW (ref 0.61–1.24)
Creatinine, Ser: 0.6 mg/dL — ABNORMAL LOW (ref 0.61–1.24)
Creatinine, Ser: 0.6 mg/dL — ABNORMAL LOW (ref 0.61–1.24)
Creatinine, Ser: 0.6 mg/dL — ABNORMAL LOW (ref 0.61–1.24)
Creatinine, Ser: 0.7 mg/dL (ref 0.61–1.24)
Glucose, Bld: 185 mg/dL — ABNORMAL HIGH (ref 70–99)
Glucose, Bld: 125 mg/dL — ABNORMAL HIGH (ref 70–99)
Glucose, Bld: 138 mg/dL — ABNORMAL HIGH (ref 70–99)
Glucose, Bld: 122 mg/dL — ABNORMAL HIGH (ref 70–99)
Glucose, Bld: 137 mg/dL — ABNORMAL HIGH (ref 70–99)
HCT: 38 % — ABNORMAL LOW (ref 39.0–52.0)
HCT: 30 % — ABNORMAL LOW (ref 39.0–52.0)
HCT: 37 % — ABNORMAL LOW (ref 39.0–52.0)
HCT: 30 % — ABNORMAL LOW (ref 39.0–52.0)
HCT: 33 % — ABNORMAL LOW (ref 39.0–52.0)
Hemoglobin: 12.9 g/dL — ABNORMAL LOW (ref 13.0–17.0)
Hemoglobin: 10.2 g/dL — ABNORMAL LOW (ref 13.0–17.0)
Hemoglobin: 12.6 g/dL — ABNORMAL LOW (ref 13.0–17.0)
Hemoglobin: 10.2 g/dL — ABNORMAL LOW (ref 13.0–17.0)
Hemoglobin: 11.2 g/dL — ABNORMAL LOW (ref 13.0–17.0)
Potassium: 4.4 mmol/L (ref 3.5–5.1)
Potassium: 4.4 mmol/L (ref 3.5–5.1)
Potassium: 4.7 mmol/L (ref 3.5–5.1)
Potassium: 5 mmol/L (ref 3.5–5.1)
Potassium: 4.2 mmol/L (ref 3.5–5.1)
Sodium: 138 mmol/L (ref 135–145)
Sodium: 138 mmol/L (ref 135–145)
Sodium: 138 mmol/L (ref 135–145)
Sodium: 139 mmol/L (ref 135–145)
Sodium: 140 mmol/L (ref 135–145)
TCO2: 26 mmol/L (ref 22–32)
TCO2: 23 mmol/L (ref 22–32)
TCO2: 23 mmol/L (ref 22–32)
TCO2: 24 mmol/L (ref 22–32)
TCO2: 21 mmol/L — ABNORMAL LOW (ref 22–32)

## 2020-05-09 LAB — CBC
HCT: 38.8 % — ABNORMAL LOW (ref 39.0–52.0)
HCT: 33.7 % — ABNORMAL LOW (ref 39.0–52.0)
Hemoglobin: 13.2 g/dL (ref 13.0–17.0)
Hemoglobin: 11.3 g/dL — ABNORMAL LOW (ref 13.0–17.0)
MCH: 32.5 pg (ref 26.0–34.0)
MCH: 31.9 pg (ref 26.0–34.0)
MCHC: 34 g/dL (ref 30.0–36.0)
MCHC: 33.5 g/dL (ref 30.0–36.0)
MCV: 95.6 fL (ref 80.0–100.0)
MCV: 95.2 fL (ref 80.0–100.0)
Platelets: 110 10*3/uL — ABNORMAL LOW (ref 150–400)
Platelets: 85 10*3/uL — ABNORMAL LOW (ref 150–400)
RBC: 4.06 MIL/uL — ABNORMAL LOW (ref 4.22–5.81)
RBC: 3.54 MIL/uL — ABNORMAL LOW (ref 4.22–5.81)
RDW: 12.6 % (ref 11.5–15.5)
RDW: 12.6 % (ref 11.5–15.5)
WBC: 16.8 10*3/uL — ABNORMAL HIGH (ref 4.0–10.5)
WBC: 8.5 10*3/uL (ref 4.0–10.5)
nRBC: 0 % (ref 0.0–0.2)
nRBC: 0 % (ref 0.0–0.2)

## 2020-05-09 LAB — GLUCOSE, CAPILLARY
Glucose-Capillary: 194 mg/dL — ABNORMAL HIGH (ref 70–99)
Glucose-Capillary: 122 mg/dL — ABNORMAL HIGH (ref 70–99)
Glucose-Capillary: 132 mg/dL — ABNORMAL HIGH (ref 70–99)
Glucose-Capillary: 98 mg/dL (ref 70–99)
Glucose-Capillary: 133 mg/dL — ABNORMAL HIGH (ref 70–99)
Glucose-Capillary: 131 mg/dL — ABNORMAL HIGH (ref 70–99)
Glucose-Capillary: 114 mg/dL — ABNORMAL HIGH (ref 70–99)
Glucose-Capillary: 105 mg/dL — ABNORMAL HIGH (ref 70–99)

## 2020-05-09 LAB — HEMOGLOBIN AND HEMATOCRIT, BLOOD
HCT: 32.3 % — ABNORMAL LOW (ref 39.0–52.0)
Hemoglobin: 10.9 g/dL — ABNORMAL LOW (ref 13.0–17.0)

## 2020-05-09 LAB — APTT: aPTT: 29 seconds (ref 24–36)

## 2020-05-09 LAB — PROTIME-INR
INR: 1.3 — ABNORMAL HIGH (ref 0.8–1.2)
Prothrombin Time: 15.4 seconds — ABNORMAL HIGH (ref 11.4–15.2)

## 2020-05-09 LAB — MAGNESIUM: Magnesium: 2.8 mg/dL — ABNORMAL HIGH (ref 1.7–2.4)

## 2020-05-09 LAB — PLATELET COUNT: Platelets: 115 10*3/uL — ABNORMAL LOW (ref 150–400)

## 2020-05-09 SURGERY — REPAIR, MITRAL VALVE, MINIMALLY INVASIVE
Anesthesia: General | Site: Chest | Laterality: Right

## 2020-05-09 MED ORDER — SODIUM CHLORIDE 0.9% FLUSH
10.0000 mL | INTRAVENOUS | Status: DC | PRN
Start: 2020-05-09 — End: 2020-05-14

## 2020-05-09 MED ORDER — CHLORHEXIDINE GLUCONATE CLOTH 2 % EX PADS
6.0000 | MEDICATED_PAD | Freq: Every day | CUTANEOUS | Status: DC
  Administered 2020-05-09 – 2020-05-13 (×5): 6 via TOPICAL

## 2020-05-09 MED ORDER — SUGAMMADEX SODIUM 200 MG/2ML IV SOLN
INTRAVENOUS | Status: DC | PRN
  Administered 2020-05-09: 200 mg via INTRAVENOUS

## 2020-05-09 MED ORDER — BISACODYL 5 MG PO TBEC
10.0000 mg | DELAYED_RELEASE_TABLET | Freq: Every day | ORAL | Status: DC
  Administered 2020-05-10 – 2020-05-13 (×4): 10 mg via ORAL
  Filled 2020-05-09 (×5): qty 2

## 2020-05-09 MED ORDER — NITROGLYCERIN IN D5W 200-5 MCG/ML-% IV SOLN: 0.0000 ug/min | INTRAVENOUS | Status: DC

## 2020-05-09 MED ORDER — VANCOMYCIN HCL 1000 MG IV SOLR
INTRAVENOUS | Status: DC | PRN
  Administered 2020-05-09: 1000 mL

## 2020-05-09 MED ORDER — ACETAMINOPHEN 160 MG/5ML PO SOLN: 1000.0000 mg | Freq: Four times a day (QID) | ORAL | Status: DC

## 2020-05-09 MED ORDER — SODIUM CHLORIDE 0.45 % IV SOLN
INTRAVENOUS | Status: DC | PRN
Start: 2020-05-09 — End: 2020-05-10

## 2020-05-09 MED ORDER — FENTANYL CITRATE (PF) 250 MCG/5ML IJ SOLN
INTRAMUSCULAR | Status: AC
  Filled 2020-05-09: qty 10

## 2020-05-09 MED ORDER — MIDAZOLAM HCL 5 MG/5ML IJ SOLN
INTRAMUSCULAR | Status: DC | PRN
  Administered 2020-05-09: 2 mg via INTRAVENOUS
  Administered 2020-05-09: 1 mg via INTRAVENOUS
  Administered 2020-05-09: 3 mg via INTRAVENOUS
  Administered 2020-05-09: 1 mg via INTRAVENOUS

## 2020-05-09 MED ORDER — LACTATED RINGERS IV SOLN: 500.0000 mL | Freq: Once | INTRAVENOUS | Status: DC | PRN

## 2020-05-09 MED ORDER — ACETAMINOPHEN 650 MG RE SUPP
650.0000 mg | Freq: Once | RECTAL | Status: AC
  Administered 2020-05-09: 650 mg via RECTAL

## 2020-05-09 MED ORDER — PROTAMINE SULFATE 10 MG/ML IV SOLN
INTRAVENOUS | Status: AC
  Filled 2020-05-09: qty 5

## 2020-05-09 MED ORDER — ONDANSETRON HCL 4 MG/2ML IJ SOLN: 4.0000 mg | Freq: Four times a day (QID) | INTRAMUSCULAR | Status: DC | PRN

## 2020-05-09 MED ORDER — DOCUSATE SODIUM 100 MG PO CAPS
200.0000 mg | ORAL_CAPSULE | Freq: Every day | ORAL | Status: DC
  Administered 2020-05-10 – 2020-05-14 (×5): 200 mg via ORAL
  Filled 2020-05-09 (×5): qty 2

## 2020-05-09 MED ORDER — SODIUM CHLORIDE 0.9% FLUSH
10.0000 mL | Freq: Two times a day (BID) | INTRAVENOUS | Status: DC
  Administered 2020-05-09 – 2020-05-13 (×6): 10 mL

## 2020-05-09 MED ORDER — MIDAZOLAM HCL (PF) 10 MG/2ML IJ SOLN
INTRAMUSCULAR | Status: AC
  Filled 2020-05-09: qty 2

## 2020-05-09 MED ORDER — VANCOMYCIN HCL IN DEXTROSE 1-5 GM/200ML-% IV SOLN
1000.0000 mg | Freq: Once | INTRAVENOUS | Status: AC
  Administered 2020-05-09: 1000 mg via INTRAVENOUS
  Filled 2020-05-09: qty 200

## 2020-05-09 MED ORDER — KETOROLAC TROMETHAMINE 15 MG/ML IJ SOLN
15.0000 mg | Freq: Four times a day (QID) | INTRAMUSCULAR | Status: AC
  Administered 2020-05-09 – 2020-05-10 (×5): 15 mg via INTRAVENOUS
  Filled 2020-05-09 (×5): qty 1

## 2020-05-09 MED ORDER — BUPIVACAINE HCL (PF) 0.5 % IJ SOLN
INTRAMUSCULAR | Status: AC
  Filled 2020-05-09: qty 30

## 2020-05-09 MED ORDER — ACETAMINOPHEN 160 MG/5ML PO SOLN: 650.0000 mg | Freq: Once | ORAL | Status: AC

## 2020-05-09 MED ORDER — ALBUMIN HUMAN 5 % IV SOLN: INTRAVENOUS | Status: DC | PRN

## 2020-05-09 MED ORDER — FENTANYL CITRATE (PF) 250 MCG/5ML IJ SOLN
INTRAMUSCULAR | Status: DC | PRN
  Administered 2020-05-09 (×2): 25 ug via INTRAVENOUS
  Administered 2020-05-09 (×3): 50 ug via INTRAVENOUS
  Administered 2020-05-09: 300 ug via INTRAVENOUS

## 2020-05-09 MED ORDER — ROCURONIUM BROMIDE 10 MG/ML (PF) SYRINGE
PREFILLED_SYRINGE | INTRAVENOUS | Status: AC
  Filled 2020-05-09: qty 10

## 2020-05-09 MED ORDER — PLASMA-LYTE 148 IV SOLN
INTRAVENOUS | Status: DC | PRN
  Administered 2020-05-09: 500 mL via INTRAVASCULAR

## 2020-05-09 MED ORDER — ACETAMINOPHEN 500 MG PO TABS
1000.0000 mg | ORAL_TABLET | Freq: Four times a day (QID) | ORAL | Status: DC
  Administered 2020-05-09 – 2020-05-14 (×18): 1000 mg via ORAL
  Filled 2020-05-09 (×18): qty 2

## 2020-05-09 MED ORDER — TRAMADOL HCL 50 MG PO TABS
50.0000 mg | ORAL_TABLET | ORAL | Status: DC | PRN
  Administered 2020-05-10 – 2020-05-12 (×8): 100 mg via ORAL
  Administered 2020-05-13: 50 mg via ORAL
  Administered 2020-05-13: 100 mg via ORAL
  Administered 2020-05-13: 50 mg via ORAL
  Filled 2020-05-09 (×10): qty 2
  Filled 2020-05-09: qty 1

## 2020-05-09 MED ORDER — LACTATED RINGERS IV SOLN: INTRAVENOUS | Status: DC | PRN

## 2020-05-09 MED ORDER — BISACODYL 10 MG RE SUPP
10.0000 mg | Freq: Every day | RECTAL | Status: DC
  Administered 2020-05-12: 10 mg via RECTAL
  Filled 2020-05-09: qty 1

## 2020-05-09 MED ORDER — PHENYLEPHRINE HCL-NACL 20-0.9 MG/250ML-% IV SOLN: 0.0000 ug/min | INTRAVENOUS | Status: DC

## 2020-05-09 MED ORDER — PROTAMINE SULFATE 10 MG/ML IV SOLN
INTRAVENOUS | Status: AC
  Filled 2020-05-09: qty 25

## 2020-05-09 MED ORDER — ASPIRIN EC 325 MG PO TBEC: 325.0000 mg | DELAYED_RELEASE_TABLET | Freq: Every day | ORAL | Status: DC

## 2020-05-09 MED ORDER — ALBUMIN HUMAN 5 % IV SOLN
250.0000 mL | INTRAVENOUS | Status: AC | PRN
  Administered 2020-05-09 – 2020-05-10 (×4): 12.5 g via INTRAVENOUS
  Filled 2020-05-09 (×2): qty 250

## 2020-05-09 MED ORDER — LACTATED RINGERS IV SOLN: INTRAVENOUS | Status: DC

## 2020-05-09 MED ORDER — FAMOTIDINE IN NACL 20-0.9 MG/50ML-% IV SOLN
20.0000 mg | Freq: Two times a day (BID) | INTRAVENOUS | Status: AC
  Administered 2020-05-09 (×2): 20 mg via INTRAVENOUS
  Filled 2020-05-09 (×2): qty 50

## 2020-05-09 MED ORDER — MORPHINE SULFATE (PF) 2 MG/ML IV SOLN
1.0000 mg | INTRAVENOUS | Status: DC | PRN
  Administered 2020-05-09 – 2020-05-10 (×5): 2 mg via INTRAVENOUS
  Administered 2020-05-10: 4 mg via INTRAVENOUS
  Administered 2020-05-10 (×2): 2 mg via INTRAVENOUS
  Administered 2020-05-11 (×2): 4 mg via INTRAVENOUS
  Administered 2020-05-12: 2 mg via INTRAVENOUS
  Filled 2020-05-09: qty 2
  Filled 2020-05-09: qty 1
  Filled 2020-05-09: qty 2
  Filled 2020-05-09 (×7): qty 1
  Filled 2020-05-09: qty 2

## 2020-05-09 MED ORDER — METOPROLOL TARTRATE 5 MG/5ML IV SOLN: 2.5000 mg | INTRAVENOUS | Status: DC | PRN

## 2020-05-09 MED ORDER — POTASSIUM CHLORIDE 10 MEQ/50ML IV SOLN
10.0000 meq | INTRAVENOUS | Status: AC
  Administered 2020-05-09 (×3): 10 meq via INTRAVENOUS

## 2020-05-09 MED ORDER — SODIUM CHLORIDE 0.9 % IV SOLN: INTRAVENOUS | Status: DC

## 2020-05-09 MED ORDER — SODIUM CHLORIDE 0.9% FLUSH
3.0000 mL | Freq: Two times a day (BID) | INTRAVENOUS | Status: DC
  Administered 2020-05-10 – 2020-05-13 (×6): 3 mL via INTRAVENOUS

## 2020-05-09 MED ORDER — HEPARIN SODIUM (PORCINE) 1000 UNIT/ML IJ SOLN
INTRAMUSCULAR | Status: DC | PRN
  Administered 2020-05-09: 30000 [IU] via INTRAVENOUS

## 2020-05-09 MED ORDER — DEXAMETHASONE SODIUM PHOSPHATE 10 MG/ML IJ SOLN
INTRAMUSCULAR | Status: DC | PRN
  Administered 2020-05-09: 5 mg via INTRAVENOUS

## 2020-05-09 MED ORDER — CHLORHEXIDINE GLUCONATE 0.12 % MT SOLN
15.0000 mL | OROMUCOSAL | Status: AC
  Administered 2020-05-09: 15 mL via OROMUCOSAL

## 2020-05-09 MED ORDER — METOPROLOL TARTRATE 12.5 MG HALF TABLET
12.5000 mg | ORAL_TABLET | Freq: Once | ORAL | Status: AC
  Administered 2020-05-09: 12.5 mg via ORAL
  Filled 2020-05-09: qty 1

## 2020-05-09 MED ORDER — ARTIFICIAL TEARS OPHTHALMIC OINT
TOPICAL_OINTMENT | OPHTHALMIC | Status: DC | PRN
  Administered 2020-05-09: 1 via OPHTHALMIC

## 2020-05-09 MED ORDER — ONDANSETRON HCL 4 MG/2ML IJ SOLN
INTRAMUSCULAR | Status: DC | PRN
  Administered 2020-05-09: 4 mg via INTRAVENOUS

## 2020-05-09 MED ORDER — DEXAMETHASONE SODIUM PHOSPHATE 10 MG/ML IJ SOLN
INTRAMUSCULAR | Status: AC
  Filled 2020-05-09: qty 1

## 2020-05-09 MED ORDER — DEXTROSE 50 % IV SOLN: 0.0000 mL | INTRAVENOUS | Status: DC | PRN

## 2020-05-09 MED ORDER — MAGNESIUM SULFATE 4 GM/100ML IV SOLN
4.0000 g | Freq: Once | INTRAVENOUS | Status: AC
  Administered 2020-05-09: 4 g via INTRAVENOUS
  Filled 2020-05-09: qty 100

## 2020-05-09 MED ORDER — ROCURONIUM BROMIDE 10 MG/ML (PF) SYRINGE
PREFILLED_SYRINGE | INTRAVENOUS | Status: DC | PRN
  Administered 2020-05-09: 100 mg via INTRAVENOUS
  Administered 2020-05-09: 30 mg via INTRAVENOUS
  Administered 2020-05-09: 40 mg via INTRAVENOUS
  Administered 2020-05-09: 20 mg via INTRAVENOUS
  Administered 2020-05-09: 10 mg via INTRAVENOUS
  Administered 2020-05-09: 20 mg via INTRAVENOUS
  Administered 2020-05-09: 40 mg via INTRAVENOUS

## 2020-05-09 MED ORDER — PROTAMINE SULFATE 10 MG/ML IV SOLN
INTRAVENOUS | Status: DC | PRN
  Administered 2020-05-09: 300 mg via INTRAVENOUS

## 2020-05-09 MED ORDER — ONDANSETRON HCL 4 MG/2ML IJ SOLN
INTRAMUSCULAR | Status: AC
  Filled 2020-05-09: qty 2

## 2020-05-09 MED ORDER — SODIUM CHLORIDE (PF) 0.9 % IJ SOLN
INTRAMUSCULAR | Status: AC
  Filled 2020-05-09: qty 10

## 2020-05-09 MED ORDER — BUPIVACAINE LIPOSOME 1.3 % IJ SUSP
INTRAMUSCULAR | Status: DC | PRN
  Administered 2020-05-09: 50 mL

## 2020-05-09 MED ORDER — ARTIFICIAL TEARS OPHTHALMIC OINT
TOPICAL_OINTMENT | OPHTHALMIC | Status: AC
  Filled 2020-05-09: qty 3.5

## 2020-05-09 MED ORDER — SODIUM CHLORIDE 0.9 % IV SOLN
1.5000 g | Freq: Two times a day (BID) | INTRAVENOUS | Status: AC
  Administered 2020-05-10 – 2020-05-11 (×4): 1.5 g via INTRAVENOUS
  Filled 2020-05-09 (×4): qty 1.5

## 2020-05-09 MED ORDER — PHENYLEPHRINE 40 MCG/ML (10ML) SYRINGE FOR IV PUSH (FOR BLOOD PRESSURE SUPPORT)
PREFILLED_SYRINGE | INTRAVENOUS | Status: AC
  Filled 2020-05-09: qty 10

## 2020-05-09 MED ORDER — ORAL CARE MOUTH RINSE
15.0000 mL | Freq: Two times a day (BID) | OROMUCOSAL | Status: DC
  Administered 2020-05-09 – 2020-05-13 (×7): 15 mL via OROMUCOSAL

## 2020-05-09 MED ORDER — ASPIRIN 81 MG PO CHEW: 324.0000 mg | CHEWABLE_TABLET | Freq: Every day | ORAL | Status: DC

## 2020-05-09 MED ORDER — SODIUM CHLORIDE 0.9% FLUSH
3.0000 mL | INTRAVENOUS | Status: DC | PRN
  Administered 2020-05-12: 3 mL via INTRAVENOUS

## 2020-05-09 MED ORDER — OXYCODONE HCL 5 MG PO TABS
5.0000 mg | ORAL_TABLET | ORAL | Status: DC | PRN
  Administered 2020-05-09: 5 mg via ORAL
  Administered 2020-05-10 – 2020-05-13 (×15): 10 mg via ORAL
  Administered 2020-05-13: 5 mg via ORAL
  Administered 2020-05-13 – 2020-05-14 (×3): 10 mg via ORAL
  Filled 2020-05-09 (×4): qty 2
  Filled 2020-05-09: qty 1
  Filled 2020-05-09 (×10): qty 2
  Filled 2020-05-09: qty 1
  Filled 2020-05-09 (×4): qty 2

## 2020-05-09 MED ORDER — HEPARIN SODIUM (PORCINE) 1000 UNIT/ML IJ SOLN
INTRAMUSCULAR | Status: AC
  Filled 2020-05-09: qty 1

## 2020-05-09 MED ORDER — SODIUM CHLORIDE 0.9 % IV SOLN
250.0000 mL | INTRAVENOUS | Status: DC
  Administered 2020-05-10: 250 mL via INTRAVENOUS

## 2020-05-09 MED ORDER — PANTOPRAZOLE SODIUM 40 MG PO TBEC
40.0000 mg | DELAYED_RELEASE_TABLET | Freq: Every day | ORAL | Status: DC
  Administered 2020-05-11 – 2020-05-14 (×4): 40 mg via ORAL
  Filled 2020-05-09 (×4): qty 1

## 2020-05-09 MED ORDER — CHLORHEXIDINE GLUCONATE 0.12 % MT SOLN
15.0000 mL | Freq: Once | OROMUCOSAL | Status: AC
  Administered 2020-05-09: 15 mL via OROMUCOSAL
  Filled 2020-05-09: qty 15

## 2020-05-09 MED ORDER — PROPOFOL 10 MG/ML IV BOLUS
INTRAVENOUS | Status: AC
  Filled 2020-05-09: qty 20

## 2020-05-09 MED ORDER — PROPOFOL 10 MG/ML IV BOLUS
INTRAVENOUS | Status: DC | PRN
  Administered 2020-05-09: 40 mg via INTRAVENOUS
  Administered 2020-05-09: 100 mg via INTRAVENOUS

## 2020-05-09 MED ORDER — DEXMEDETOMIDINE HCL IN NACL 400 MCG/100ML IV SOLN: 0.0000 ug/kg/h | INTRAVENOUS | Status: DC

## 2020-05-09 MED ORDER — CHLORHEXIDINE GLUCONATE 4 % EX LIQD: 30.0000 mL | CUTANEOUS | Status: DC

## 2020-05-09 MED ORDER — MIDAZOLAM HCL 2 MG/2ML IJ SOLN: 2.0000 mg | INTRAMUSCULAR | Status: DC | PRN

## 2020-05-09 MED ORDER — SODIUM CHLORIDE 0.9 % IV SOLN: INTRAVENOUS | Status: DC | PRN

## 2020-05-09 MED ORDER — INSULIN REGULAR(HUMAN) IN NACL 100-0.9 UT/100ML-% IV SOLN: INTRAVENOUS | Status: DC

## 2020-05-09 MED ORDER — 0.9 % SODIUM CHLORIDE (POUR BTL) OPTIME
TOPICAL | Status: DC | PRN
  Administered 2020-05-09: 4000 mL

## 2020-05-09 SURGICAL SUPPLY — 108 items
ADAPTER CARDIO PERF ANTE/RETRO (ADAPTER) ×3 IMPLANT
BAG DECANTER FOR FLEXI CONT (MISCELLANEOUS) ×6 IMPLANT
BLADE CLIPPER SURG (BLADE) ×3 IMPLANT
BLADE SURG 11 STRL SS (BLADE) ×4 IMPLANT
CANISTER SUCT 3000ML PPV (MISCELLANEOUS) ×6 IMPLANT
CANNULA ADULT BIO-MEDICUS 15FR (CANNULA) ×1 IMPLANT
CANNULA FEM VENOUS REMOTE 22FR (CANNULA) ×1 IMPLANT
CANNULA FEMORAL ART 14 SM (MISCELLANEOUS) ×2 IMPLANT
CANNULA GUNDRY RCSP 15FR (MISCELLANEOUS) ×3 IMPLANT
CANNULA OPTISITE PERFUSION 16F (CANNULA) IMPLANT
CANNULA OPTISITE PERFUSION 18F (CANNULA) ×1 IMPLANT
CANNULA SUMP PERICARDIAL (CANNULA) ×6 IMPLANT
CATH CPB KIT OWEN (MISCELLANEOUS) IMPLANT
CATH KIT ON-Q SILVERSOAK 5 (CATHETERS) IMPLANT
CATH KIT ON-Q SILVERSOAK 5IN (CATHETERS) IMPLANT
CELLS DAT CNTRL 66122 CELL SVR (MISCELLANEOUS) ×2 IMPLANT
CLOSURE PERCLOSE PROSTYLE (VASCULAR PRODUCTS) ×4 IMPLANT
CNTNR URN SCR LID CUP LEK RST (MISCELLANEOUS) ×2 IMPLANT
CONN ST 1/4X3/8  BEN (MISCELLANEOUS) ×6
CONN ST 1/4X3/8 BEN (MISCELLANEOUS) ×4 IMPLANT
CONNECTOR 1/2X3/8X1/2 3 WAY (MISCELLANEOUS) ×3
CONNECTOR 1/2X3/8X1/2 3WAY (MISCELLANEOUS) ×2 IMPLANT
CONT SPEC 4OZ STRL OR WHT (MISCELLANEOUS) ×3
COVER BACK TABLE 24X17X13 BIG (DRAPES) ×3 IMPLANT
COVER PROBE W GEL 5X96 (DRAPES) ×3 IMPLANT
DERMABOND ADHESIVE PROPEN (GAUZE/BANDAGES/DRESSINGS) ×2
DERMABOND ADVANCED (GAUZE/BANDAGES/DRESSINGS) ×2
DERMABOND ADVANCED .7 DNX12 (GAUZE/BANDAGES/DRESSINGS) ×4 IMPLANT
DERMABOND ADVANCED .7 DNX6 (GAUZE/BANDAGES/DRESSINGS) IMPLANT
DEVICE SUT CK QUICK LOAD INDV (Prosthesis & Implant Heart) ×1 IMPLANT
DEVICE SUT CK QUICK LOAD MINI (Prosthesis & Implant Heart) ×1 IMPLANT
DEVICE TROCAR PUNCTURE CLOSURE (ENDOMECHANICALS) ×3 IMPLANT
DRAIN CHANNEL 32F RND 10.7 FF (WOUND CARE) ×6 IMPLANT
DRAPE C-ARM 42X72 X-RAY (DRAPES) ×3 IMPLANT
DRAPE CV SPLIT W-CLR ANES SCRN (DRAPES) ×3 IMPLANT
DRAPE INCISE IOBAN 66X45 STRL (DRAPES) ×8 IMPLANT
DRAPE PERI GROIN 82X75IN TIB (DRAPES) ×3 IMPLANT
DRAPE SLUSH/WARMER DISC (DRAPES) ×3 IMPLANT
DRSG AQUACEL AG ADV 3.5X10 (GAUZE/BANDAGES/DRESSINGS) ×3 IMPLANT
ELECT BLADE 6.5 EXT (BLADE) ×3 IMPLANT
ELECT REM PT RETURN 9FT ADLT (ELECTROSURGICAL) ×6
ELECTRODE REM PT RTRN 9FT ADLT (ELECTROSURGICAL) ×4 IMPLANT
FELT TEFLON 1X6 (MISCELLANEOUS) ×4 IMPLANT
FEMORAL VENOUS CANN RAP (CANNULA) IMPLANT
GAUZE SPONGE 4X4 12PLY STRL (GAUZE/BANDAGES/DRESSINGS) ×1 IMPLANT
GAUZE SPONGE 4X4 12PLY STRL LF (GAUZE/BANDAGES/DRESSINGS) ×3 IMPLANT
GLOVE ORTHO TXT STRL SZ7.5 (GLOVE) ×9 IMPLANT
GLOVE SURG ENC MOIS LTX SZ6.5 (GLOVE) ×1 IMPLANT
GLOVE SURG SS PI 7.0 STRL IVOR (GLOVE) ×2 IMPLANT
GLOVE SURG SS PI 7.5 STRL IVOR (GLOVE) ×1 IMPLANT
GLOVE SURG UNDER POLY LF SZ6 (GLOVE) ×1 IMPLANT
GOWN STRL REUS W/ TWL LRG LVL3 (GOWN DISPOSABLE) ×8 IMPLANT
GOWN STRL REUS W/ TWL XL LVL3 (GOWN DISPOSABLE) IMPLANT
GOWN STRL REUS W/TWL LRG LVL3 (GOWN DISPOSABLE) ×24
GOWN STRL REUS W/TWL XL LVL3 (GOWN DISPOSABLE) ×3
GRASPER SUT TROCAR 14GX15 (MISCELLANEOUS) ×3 IMPLANT
KIT BASIN OR (CUSTOM PROCEDURE TRAY) ×3 IMPLANT
KIT DILATOR VASC 18G NDL (KITS) ×3 IMPLANT
KIT DRAINAGE VACCUM ASSIST (KITS) ×1 IMPLANT
KIT SUCTION CATH 14FR (SUCTIONS) ×3 IMPLANT
KIT SUT CK MINI COMBO 4X17 (Prosthesis & Implant Heart) ×1 IMPLANT
KIT TURNOVER KIT B (KITS) ×3 IMPLANT
LEAD PACING MYOCARDI (MISCELLANEOUS) ×3 IMPLANT
LINE VENT (MISCELLANEOUS) ×1 IMPLANT
NDL AORTIC ROOT 14G 7F (CATHETERS) ×2 IMPLANT
NEEDLE AORTIC ROOT 14G 7F (CATHETERS) ×3 IMPLANT
NS IRRIG 1000ML POUR BTL (IV SOLUTION) ×15 IMPLANT
PACK E MIN INVASIVE VALVE (SUTURE) ×3 IMPLANT
PACK OPEN HEART (CUSTOM PROCEDURE TRAY) ×3 IMPLANT
PAD ARMBOARD 7.5X6 YLW CONV (MISCELLANEOUS) ×6 IMPLANT
PAD ELECT DEFIB RADIOL ZOLL (MISCELLANEOUS) ×3 IMPLANT
POSITIONER HEAD DONUT 9IN (MISCELLANEOUS) ×3 IMPLANT
RETRACTOR WND ALEXIS 18 MED (MISCELLANEOUS) ×2 IMPLANT
RING MITRAL MEMO 4D 28 (Prosthesis & Implant Heart) ×1 IMPLANT
RTRCTR WOUND ALEXIS 18CM MED (MISCELLANEOUS) ×3
SET CANNULATION TOURNIQUET (MISCELLANEOUS) ×3 IMPLANT
SET CARDIOPLEGIA MPS 5001102 (MISCELLANEOUS) ×1 IMPLANT
SET IRRIG TUBING LAPAROSCOPIC (IRRIGATION / IRRIGATOR) ×3 IMPLANT
SET MICROPUNCTURE 5F STIFF (MISCELLANEOUS) ×4 IMPLANT
SHEATH PINNACLE 8F 10CM (SHEATH) ×9 IMPLANT
SIZER CHORD-X CHORDAL CXCS (SIZER) ×1 IMPLANT
SOL ANTI FOG 6CC (MISCELLANEOUS) ×2 IMPLANT
SOLUTION ANTI FOG 6CC (MISCELLANEOUS) ×1
SUT BONE WAX W31G (SUTURE) ×3 IMPLANT
SUT EB EXC GRN/WHT 2-0 D/A SH (SUTURE) ×3
SUT ETHIBOND (SUTURE) ×2 IMPLANT
SUT ETHIBOND 2-0 RB-1 WHT (SUTURE) ×2 IMPLANT
SUT ETHIBOND X763 2 0 SH 1 (SUTURE) ×3 IMPLANT
SUT GORETEX CV 4 TH 22 36 (SUTURE) IMPLANT
SUT GORETEX CV4 TH-18 (SUTURE) IMPLANT
SUT PROLENE 3 0 SH DA (SUTURE) ×2 IMPLANT
SUT PROLENE 3 0 SH1 36 (SUTURE) ×14 IMPLANT
SUT PTFE CHORD X 20MM (SUTURE) ×1 IMPLANT
SUTURE EB EXC GRN/WHT 2-0 D/A (SUTURE) IMPLANT
SYSTEM SAHARA CHEST DRAIN ATS (WOUND CARE) ×3 IMPLANT
TAPE CLOTH SURG 4X10 WHT LF (GAUZE/BANDAGES/DRESSINGS) ×1 IMPLANT
TAPE PAPER 2X10 WHT MICROPORE (GAUZE/BANDAGES/DRESSINGS) ×1 IMPLANT
TOWEL GREEN STERILE (TOWEL DISPOSABLE) ×3 IMPLANT
TOWEL GREEN STERILE FF (TOWEL DISPOSABLE) ×3 IMPLANT
TRAY FOLEY SLVR 16FR TEMP STAT (SET/KITS/TRAYS/PACK) ×3 IMPLANT
TROCAR XCEL BLADELESS 5X75MML (TROCAR) ×3 IMPLANT
TROCAR XCEL NON-BLD 11X100MML (ENDOMECHANICALS) ×6 IMPLANT
TUBE SUCT INTRACARD DLP 20F (MISCELLANEOUS) ×3 IMPLANT
TUNNELER SHEATH ON-Q 11GX8 DSP (PAIN MANAGEMENT) IMPLANT
UNDERPAD 30X36 HEAVY ABSORB (UNDERPADS AND DIAPERS) ×3 IMPLANT
WATER STERILE IRR 1000ML POUR (IV SOLUTION) ×6 IMPLANT
WIRE EMERALD 3MM-J .035X150CM (WIRE) ×3 IMPLANT
WIRE HI TORQ VERSACORE-J 145CM (WIRE) ×1 IMPLANT

## 2020-05-09 NOTE — Anesthesia Procedure Notes (Signed)
Central Venous Catheter Insertion Performed by: Albertha Ghee, MD, anesthesiologist Start/End2/11/2020 7:35 AM, 05/09/2020 7:49 AM Patient location: Pre-op. Preanesthetic checklist: patient identified, IV checked, site marked, risks and benefits discussed, surgical consent, monitors and equipment checked, pre-op evaluation, timeout performed and anesthesia consent Position: Trendelenburg Lidocaine 1% used for infiltration and patient sedated Hand hygiene performed , maximum sterile barriers used  and Seldinger technique used Catheter size: 8.5 Fr Central line was placed.Sheath introducer Swan type:thermodilation Procedure performed using ultrasound guided technique. Ultrasound Notes:anatomy identified, needle tip was noted to be adjacent to the nerve/plexus identified, no ultrasound evidence of intravascular and/or intraneural injection and image(s) printed for medical record Attempts: 2 (1st stick resulted in wire advancing towards the arm) Following insertion, line sutured, dressing applied and Biopatch. Post procedure assessment: blood return through all ports, free fluid flow and no air  Patient tolerated the procedure well with no immediate complications.

## 2020-05-09 NOTE — Anesthesia Procedure Notes (Signed)
Procedure Name: Intubation Date/Time: 05/09/2020 8:50 AM Performed by: Janene Harvey, CRNA Pre-anesthesia Checklist: Patient identified, Emergency Drugs available, Suction available and Patient being monitored Patient Re-evaluated:Patient Re-evaluated prior to induction Oxygen Delivery Method: Circle system utilized Preoxygenation: Pre-oxygenation with 100% oxygen Induction Type: IV induction Ventilation: Mask ventilation without difficulty and Oral airway inserted - appropriate to patient size Laryngoscope Size: Mac and 4 Grade View: Grade I Tube type: Oral Endobronchial tube: Left and Double lumen EBT and 39 Fr Number of attempts: 1 Airway Equipment and Method: Stylet and Oral airway Placement Confirmation: ETT inserted through vocal cords under direct vision,  positive ETCO2 and breath sounds checked- equal and bilateral Secured at: 29 cm Tube secured with: Tape Dental Injury: Teeth and Oropharynx as per pre-operative assessment

## 2020-05-09 NOTE — Interval H&P Note (Signed)
History and Physical Interval Note:  05/09/2020 7:25 AM  [name]  has presented today for surgery, with the diagnosis of MR.  The various methods of treatment have been discussed with the patient and family. After consideration of risks, benefits and other options for treatment, the patient has consented to  Procedure(s): MINIMALLY INVASIVE MITRAL VALVE REPAIR (MVR) (Right) TRANSESOPHAGEAL ECHOCARDIOGRAM (TEE) (N/A) as a surgical intervention.  The patient's history has been reviewed, patient examined, no change in status, stable for surgery.  I have reviewed the patient's chart and labs.  Questions were answered to the patient's satisfaction.     [name]

## 2020-05-09 NOTE — Transfer of Care (Signed)
Immediate Anesthesia Transfer of Care Note  Patient: MASAKI ROTHBAUER  Procedure(s) Performed: MINIMALLY INVASIVE MITRAL VALVE REPAIR (MVR) USING LIVANOVA MEMO 4D 28MM MITRAL RING (Right Chest) TRANSESOPHAGEAL ECHOCARDIOGRAM (TEE) (N/A )  Patient Location: ICU  Anesthesia Type:General  Level of Consciousness: drowsy and patient cooperative  Airway & Oxygen Therapy: Patient Spontanous Breathing and Patient connected to face mask oxygen  Post-op Assessment: Report given to RN and Post -op Vital signs reviewed and stable  Post vital signs: Reviewed  Last Vitals:  Vitals Value Taken Time  BP    Temp 36.2 C 05/09/20 1441  Pulse 89 05/09/20 1441  Resp 17 05/09/20 1441  SpO2 100 % 05/09/20 1441  Vitals shown include unvalidated device data.  Last Pain:  Vitals:   05/09/20 0701  TempSrc: Temporal         Complications: No complications documented.

## 2020-05-09 NOTE — Anesthesia Preprocedure Evaluation (Signed)
Anesthesia Evaluation  Patient identified by MRN, date of birth, ID band Patient awake    Reviewed: Allergy & Precautions, H&P , NPO status   History of Anesthesia Complications (+) PONV and history of anesthetic complications  Airway Mallampati: II   Neck ROM: full    Dental   Pulmonary asthma ,    breath sounds clear to auscultation       Cardiovascular hypertension, + Valvular Problems/Murmurs MR  Rhythm:regular Rate:Normal  Normal LV function. Flail P2 of mitral valve with mod-severe MR. Eccentric jet   Neuro/Psych    GI/Hepatic hiatal hernia,   Endo/Other  diabetes, Type 2  Renal/GU Renal diseaseSolitary kidney. H/o resection Wilm's tumor     Musculoskeletal  (+) Arthritis ,   Abdominal   Peds  Hematology   Anesthesia Other Findings   Reproductive/Obstetrics                             Anesthesia Physical Anesthesia Plan  ASA: III  Anesthesia Plan: General   Post-op Pain Management:    Induction: Intravenous  PONV Risk Score and Plan: 3 and Ondansetron, Dexamethasone, Midazolam and Treatment may vary due to age or medical condition  Airway Management Planned: Double Lumen EBT  Additional Equipment: Arterial line, CVP, PA Cath, 3D TEE and Ultrasound Guidance Line Placement  Intra-op Plan:   Post-operative Plan: Possible Post-op intubation/ventilation  Informed Consent: I have reviewed the patients History and Physical, chart, labs and discussed the procedure including the risks, benefits and alternatives for the proposed anesthesia with the patient or authorized representative who has indicated his/her understanding and acceptance.       Plan Discussed with: CRNA, Anesthesiologist and Surgeon  Anesthesia Plan Comments:         Anesthesia Quick Evaluation

## 2020-05-09 NOTE — Anesthesia Procedure Notes (Signed)
Central Venous Catheter Insertion Performed by: Albertha Ghee, MD, anesthesiologist Start/End2/11/2020 7:49 AM, 05/09/2020 7:52 AM Patient location: Pre-op. Preanesthetic checklist: patient identified, IV checked, site marked, risks and benefits discussed, surgical consent, monitors and equipment checked, pre-op evaluation, timeout performed and anesthesia consent Hand hygiene performed  and maximum sterile barriers used  PA cath was placed.Swan type:thermodilution Procedure performed without using ultrasound guided technique. Attempts: 1 Patient tolerated the procedure well with no immediate complications.

## 2020-05-09 NOTE — Op Note (Signed)
CARDIOTHORACIC SURGERY OPERATIVE NOTE  Date of Procedure:  05/09/2020  Preoperative Diagnosis: Severe Mitral Regurgitation  Postoperative Diagnosis: Same  Procedure:    Minimally-Invasive Mitral Valve Repair  Complex valvuloplasty including artificial Gore-tex neochord placement x6  Sorin Memo 4D Ring Annuloplasty (size 28 mm, catalog # [id number], serial # [id number])    Surgeon: Valentina Gu. Roxy Manns, MD  Assistant: Enid Cutter, PA-C  Anesthesia: Albertha Ghee, MD  Operative Findings:  Fibroelastic deficiency type myxomatous degenerative disease  Ruptured primary chordae tendinae with flail segment (P2) of posterior leaflet  Type II dysfunction with severe mitral regurgitation  Normal left ventricular systolic function  No residual mitral regurgitation after successful valve repair              BRIEF CLINICAL NOTE AND INDICATIONS FOR SURGERY  Patient is a 55 year old male with history of bacterial endocarditis involving the mitral valve in 5188 complicated by mitral regurgitation,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  Patient surgical excision of a lipoma in 2015 and developed severe skin infection. 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 He was noted to have moderate mitral regurgitation without signs of congestive heart failure and seen in consultation by Dr. Servando Snare at that time. He was treated medically and did well  and has been followed carefully ever since by Dr. Irish Lack.Approximately 1 year ago he was diagnosed with colon cancer and underwent left colectomy. Final pathology revealed nodal metastases (stage IIIc, T4N2Amoderately differentiated adenocarcinoma)and he was treated with postoperative adjuvant chemotherapy using 5-fluorouracil which he completed in May of this year. Most recent follow-up CT scan of the abdomen and pelvis performed October 2021 revealed no sign of residual disease.  Routine follow-up transthoracic echocardiogram performed January 26, 2020 revealed severe mitral regurgitation with normal left ventricular systolic function but slightly enlarged left ventricular chamber size. The patient was seen in follow-up with Dr. Irish Lack and subsequently underwent TEE and diagnostic cardiac catheterization on February 10, 2020. TEE confirmed the presence of severe mitral regurgitation with an obvious flail segment involving a portion of the posterior leaflet and an eccentric jet of mitral regurgitation. Left ventricular function appeared normal with ejection fraction estimated 60 to 65%. Diagnostic cardiac catheterization revealed normal coronary artery anatomy with no significant coronary artery disease. Pulmonary artery pressures were normal. Abdominal aortogram revealed no significant atherosclerotic disease. Cardiothoracic surgical consultation was requested.  The patient has been seen in consultation and counseled at length regarding the indications, risks and potential benefits of surgery.  All questions have been answered, and the patient provides full informed consent for the operation as described.    DETAILS OF THE OPERATIVE PROCEDURE  Preparation:  The patient is brought to the operating room on the above mentioned date and central monitoring was established by the anesthesia team including placement of Swan-Ganz catheter through the left internal jugular vein.  A radial  arterial line is placed. The patient is placed in the supine position on the operating table.  Intravenous antibiotics are administered. General endotracheal anesthesia is induced uneventfully. The patient is initially intubated using a dual lumen endotracheal tube.  A Foley catheter is placed.  Baseline transesophageal echocardiogram was performed.  Findings were notable for myxomatous degenerative disease of the mitral valve with severe prolapse involving the middle scallop (P2) of the posterior leaflet.  There was severe mitral regurgitation with an eccentric jet directed around the anterior portion of the left atrium.  There was normal left ventricular systolic function.  The aortic valve is normal.  There was normal right ventricular size and function.  The tricuspid annulus was not dilated.  There was mild tricuspid regurgitation.  A soft roll is placed behind the patient's left scapula and the neck gently extended and turned to the left.   The patient's right neck, chest, abdomen, both groins, and both lower extremities are prepared and draped in a sterile manner. A time out procedure is performed.   Percutaneous Vascular Access:  Percutaneous arterial and venous access were obtained on the right side.  Using ultrasound guidance the right common femoral vein was cannulated using the Seldinger technique a pair of Perclose vascular closure devises were placed at opposing 30 degree angles in the femoral vein, after which time an 8 French sheath inserted.  The right common femoral artery was cannulated using a micropuncture wire and sheath.  A pair of Perclose vascular closure devices were placed at opposing 30 degree angles in the femoral artery, and a 8 French sheath inserted.  The right internal jugular vein was cannulated  using ultrasound guidance and an 8 French sheath inserted.     Surgical Approach:  A right miniature anterolateral thoracotomy incision is performed. The incision is placed  just lateral to and superior to the right nipple. The pectoralis major muscle is retracted medially and completely preserved. The right pleural space is entered through the 3rd intercostal space. A soft tissue retractor is placed.  Two 11 mm ports are placed through separate stab incisions inferiorly. The right pleural space is insufflated continuously with carbon dioxide gas through the posterior port during the remainder of the operation.  A pledgeted sutures placed through the dome of the right hemidiaphragm and retracted inferiorly to facilitate exposure.  A longitudinal incision is made in the pericardium 3 cm anterior to the phrenic nerve and silk traction sutures are placed on either side of the incision for exposure.   Extracorporeal Cardiopulmonary Bypass and Myocardial Protection:   The patient was heparinized systemically.  The right common femoral vein is cannulated through the venous sheath and a guidewire advanced into the right atrium using TEE guidance.  The femoral vein cannulated using a 22 Fr long femoral venous cannula.  The right common femoral artery is cannulated through the arterial sheath and a guidewire advanced into the descending thoracic aorta using TEE guidance.  Femoral artery is cannulated with a 18 French femoral arterial cannula.  The right internal jugular vein is cannulated through the venous sheath and a guidewire advanced into the right atrium.  The internal jugular vein is cannulated using a 15 [place] pediatric femoral venous cannula.   Adequate heparinization is verified.   The entire pre-bypass portion of the operation was notable for stable hemodynamics.  Cardiopulmonary bypass was begun.  Vacuum assist venous drainage is utilized. The incision in the pericardium is extended in both directions. Venous drainage and exposure are notably excellent. A retrograde cardioplegia cannula is placed through the right atrium into the coronary sinus using transesophageal  echocardiogram guidance.  An antegrade cardioplegia cannula is placed in the ascending aorta.    The patient is cooled to 32C systemic temperature.  The aortic  cross clamp is applied and cardioplegia is delivered initially in an antegrade fashion through the aortic root using modified [place] cold blood cardioplegia ([facility] blood cardioplegia protocol).   The initial cardioplegic arrest is rapid with early diastolic arrest.  Myocardial protection was felt to be excellent.   Mitral Valve Repair:  A left atriotomy incision was performed through the interatrial groove and extended partially across the back wall of the left atrium after opening the oblique sinus inferiorly.  The mitral valve is exposed using a self-retaining retractor.  The mitral valve was inspected and notable for fibroelastic deficiency type myxomatous degenerative disease.  There were ruptured primary chordae tendon any involving middle scallop (P2) of the posterior leaflet.  The ruptured cords appeared to have emanated from the posterior papillary muscle.  The valve was normal size and there was not redundant leaflet tissue.  Most of the leaflet tissue was quite thin and delicate.  No other abnormalities were noted.  Artificial neochord placement was performed using Chord-X multi-strand CV-4 Goretex pre-measured loops.  The appropriate cord length (44mm) was measured from corresponding normal length primary cords from the P3 segment of the posterior leaflet. The papillary muscle suture of a Chord-X multi-strand suture was placed through the head of the posterior papillary muscle in a horizontal mattress fashion and tied over Teflon felt pledgets. Each of the three pre-measured loops were then reimplanted into the free margin of the P2 segment of the posterior leaflet on the posterior side of midline.  Interrupted 2-0 Ethibond horizontal mattress sutures are placed circumferentially around the entire mitral valve annulus. The  sutures will ultimately be utilized for ring annuloplasty, and at this juncture there are utilized to suspend the valve symmetrically.  The valve was tested with saline and appeared competent even without ring annuloplasty complete. The valve was sized to a 28 mm annuloplasty ring, based upon the transverse distance between the left and right commissures and the height of the anterior leaflet, corresponding to a size just slightly larger than the overall surface area of the anterior leaflet.  A Sorin Memo 4D annuloplasty ring (size 51mm, catalog #[id number], serial [id number]) was secured in place uneventfully. All ring sutures were secured using a Cor-knot device.    The valve was tested with saline and appeared competent. There is no residual leak. There was a broad, symmetrical line of coaptation of the anterior and posterior leaflet which was confirmed using the blue ink test.  Rewarming is begun.   Procedure Completion:  The atriotomy was closed using a 2-layer closure of running 3-0 Prolene suture after placing a sump drain across the mitral valve to serve as a left ventricular vent.  One final dose of warm retrograde "reanimation dose" cardioplegia was administered retrograde through the coronary sinus catheter while all air was evacuated through the aortic root.  The aortic cross clamp was removed after a total cross clamp time of 89 minutes.  Epicardial pacing wires are fixed to the inferior wall of the right ventricule and to the right atrial appendage. The patient is rewarmed to 37C temperature. The left ventricular vent and antegrade cardioplegia cannula are removed.  The pericardial sac was drained using a 32 French Bard drain placed through the anterior port incision. The patient is weaned and disconnected from cardiopulmonary bypass.  The patient's rhythm at separation from bypass was AV paced.  The patient was weaned from bypass without any inotropic support. Total cardiopulmonary bypass time  for the operation was 127 minutes.  Followup  transesophageal echocardiogram performed after separation from bypass revealed a well-seated annuloplasty ring in the mitral position with a normal functioning mitral valve. There was no residual leak.  Left ventricular function was unchanged from preoperatively.  The mean gradient across the mitral valve was estimated to be 1 mmHg.  The femoral arterial and venous cannulas were removed and all Perclose sutures secured.  Manual pressure was maintained while Protamine was administered.  The right internal jugular cannula was removed and manual pressure held on the neck and groin for 15 minutes.  Single lung ventilation was begun. The atriotomy closure was inspected for hemostasis.  The right pleural space is irrigated with saline solution and inspected for hemostasis.   A mixture of Exparel liposomal bupivacaine (20 mL) and 0.5% bupivacaine (30 mL) is utilized to create an intercostal nerve block for postoperative analgesia.  The mixture is injected under direct vision into the intercostal neurovascular bundles posteriorly to cover the second through the sixth intercostal nerve roots.  Portions of the solution are also injected into the intercostal neurovascular bundles immediately surrounding the surgical incision and immediately adjacent to the chest tube exit sites.  The right pleural space was drained using a 32 French Bard drain placed through the posterior port incision. The miniature thoracotomy incision was closed in multiple layers in routine fashion.   The post-bypass portion of the operation was notable for stable rhythm and hemodynamics.  No blood products were administered during the operation.   Disposition:  The patient tolerated the procedure well.  The patient was extubated in the operating room and subsequently transported to the surgical intensive care unit in stable condition. There were no intraoperative complications. All sponge  instrument and needle counts are verified correct at completion of the operation.     Valentina Gu. Roxy Manns MD 05/09/2020 1:52 PM

## 2020-05-09 NOTE — Anesthesia Procedure Notes (Signed)
Arterial Line Insertion Start/End2/11/2020 8:00 AM, 05/09/2020 8:10 AM Performed by: Janene Harvey, CRNA, CRNA  Patient location: Pre-op. Lidocaine 1% used for infiltration and patient sedated Right, radial was placed Catheter size: 20 G Hand hygiene performed  and maximum sterile barriers used  Allen's test indicative of satisfactory collateral circulation Attempts: 2 Procedure performed without using ultrasound guided technique. Following insertion, dressing applied and Biopatch. Post procedure assessment: normal  Patient tolerated the procedure well with no immediate complications.

## 2020-05-09 NOTE — Progress Notes (Signed)
Patient ID: [name], male   DOB: 05-Nov-1965, 55 y.o.   MRN: [id number]  TCTS Evening Rounds:   Hemodynamically stable  CI = 2.7  Sitting up on side of bed smiling  Urine output good  CT output low  CBC    Component Value Date/Time   WBC 16.8 (H) 05/09/2020 1452   RBC 4.06 (L) 05/09/2020 1452   HGB 13.2 05/09/2020 1452   HGB 13.9 02/08/2020 1128   HCT 38.8 (L) 05/09/2020 1452   HCT 40.9 02/08/2020 1128   PLT 110 (L) 05/09/2020 1452   PLT 114 (L) 02/08/2020 1128   MCV 95.6 05/09/2020 1452   MCV 97 02/08/2020 1128   MCH 32.5 05/09/2020 1452   MCHC 34.0 05/09/2020 1452   RDW 12.6 05/09/2020 1452   RDW 13.5 02/08/2020 1128   LYMPHSABS 1.1 04/05/2020 2023   MONOABS 1.0 04/05/2020 2023   EOSABS 0.0 04/05/2020 2023   BASOSABS 0.0 04/05/2020 2023     BMET    Component Value Date/Time   NA 140 05/09/2020 1322   NA 136 02/08/2020 1128   K 4.2 05/09/2020 1322   CL 106 05/09/2020 1319   CO2 16 (L) 05/07/2020 1156   GLUCOSE 137 (H) 05/09/2020 1319   BUN 13 05/09/2020 1319   BUN 24 02/08/2020 1128   CREATININE 0.70 05/09/2020 1319   CREATININE 0.99 01/09/2020 1156   CREATININE 1.07 01/04/2015 0815   CALCIUM 9.1 05/07/2020 1156   GFRNONAA >60 05/07/2020 1156   GFRNONAA >60 01/09/2020 1156   GFRAA 115 02/08/2020 1128   GFRAA >60 08/22/2019 1118     A/P:  Stable postop course. Continue current plans

## 2020-05-09 NOTE — Brief Op Note (Addendum)
05/09/2020  1:47 PM  PATIENT:  [name]  55 y.o. male  PRE-OPERATIVE DIAGNOSIS:  Mitral Regurgitation   POST-OPERATIVE DIAGNOSIS:  Mitral Regurgitation   PROCEDURE:  MINIMALLY INVASIVE MITRAL VALVE REPAIR  USING LIVANOVA MEMO 4D 28MM MITRAL RING  And PTFE neo-chord placement x 6 to flail P2 segment  TRANSESOPHAGEAL ECHOCARDIOGRAM   SURGEON:  [name], MD - Primary  PHYSICIAN ASSISTANT: Roddenberry  ASSISTANTS: Staff RNFA   ANESTHESIA:   general  EBL:  Per anesthesia and perfusion records   BLOOD ADMINISTERED:none  DRAINS: Left pleural and mediastinal drains   LOCAL MEDICATIONS USED:  Peri-incisional Exparel  SPECIMEN:  No Specimen  DISPOSITION OF SPECIMEN:  N/A  COUNTS:  YES  DICTATION: .Dragon Dictation  PLAN OF CARE: Admit to inpatient   PATIENT DISPOSITION:  ICU - extubated and stable.   Delay start of Pharmacological VTE agent (>24hrs) due to surgical blood loss or risk of bleeding: yes

## 2020-05-10 ENCOUNTER — Inpatient Hospital Stay (HOSPITAL_COMMUNITY): Payer: BC Managed Care – PPO

## 2020-05-10 ENCOUNTER — Encounter (HOSPITAL_COMMUNITY): Payer: Self-pay | Admitting: Thoracic Surgery (Cardiothoracic Vascular Surgery)

## 2020-05-10 LAB — CBC
HCT: 28.9 % — ABNORMAL LOW (ref 39.0–52.0)
HCT: 33.1 % — ABNORMAL LOW (ref 39.0–52.0)
Hemoglobin: 10.2 g/dL — ABNORMAL LOW (ref 13.0–17.0)
Hemoglobin: 11.6 g/dL — ABNORMAL LOW (ref 13.0–17.0)
MCH: 33.8 pg (ref 26.0–34.0)
MCH: 34 pg (ref 26.0–34.0)
MCHC: 35.3 g/dL (ref 30.0–36.0)
MCHC: 35 g/dL (ref 30.0–36.0)
MCV: 95.7 fL (ref 80.0–100.0)
MCV: 97.1 fL (ref 80.0–100.0)
Platelets: 75 10*3/uL — ABNORMAL LOW (ref 150–400)
Platelets: 83 10*3/uL — ABNORMAL LOW (ref 150–400)
RBC: 3.02 MIL/uL — ABNORMAL LOW (ref 4.22–5.81)
RBC: 3.41 MIL/uL — ABNORMAL LOW (ref 4.22–5.81)
RDW: 13 % (ref 11.5–15.5)
RDW: 13.1 % (ref 11.5–15.5)
WBC: 5.8 10*3/uL (ref 4.0–10.5)
WBC: 7.6 10*3/uL (ref 4.0–10.5)
nRBC: 0 % (ref 0.0–0.2)
nRBC: 0 % (ref 0.0–0.2)

## 2020-05-10 LAB — POCT I-STAT 7, (LYTES, BLD GAS, ICA,H+H)
Acid-base deficit: 5 mmol/L — ABNORMAL HIGH (ref 0.0–2.0)
Bicarbonate: 21 mmol/L (ref 20.0–28.0)
Calcium, Ion: 1.18 mmol/L (ref 1.15–1.40)
HCT: 28 % — ABNORMAL LOW (ref 39.0–52.0)
Hemoglobin: 9.5 g/dL — ABNORMAL LOW (ref 13.0–17.0)
O2 Saturation: 97 %
Patient temperature: 36.6
Potassium: 4.6 mmol/L (ref 3.5–5.1)
Sodium: 138 mmol/L (ref 135–145)
TCO2: 22 mmol/L (ref 22–32)
pCO2 arterial: 41.3 mmHg (ref 32.0–48.0)
pH, Arterial: 7.311 — ABNORMAL LOW (ref 7.350–7.450)
pO2, Arterial: 100 mmHg (ref 83.0–108.0)

## 2020-05-10 LAB — BASIC METABOLIC PANEL
Anion gap: 7 (ref 5–15)
Anion gap: 10 (ref 5–15)
BUN: 14 mg/dL (ref 6–20)
BUN: 16 mg/dL (ref 6–20)
CO2: 19 mmol/L — ABNORMAL LOW (ref 22–32)
CO2: 19 mmol/L — ABNORMAL LOW (ref 22–32)
Calcium: 7.4 mg/dL — ABNORMAL LOW (ref 8.9–10.3)
Calcium: 8.1 mg/dL — ABNORMAL LOW (ref 8.9–10.3)
Chloride: 109 mmol/L (ref 98–111)
Chloride: 105 mmol/L (ref 98–111)
Creatinine, Ser: 0.84 mg/dL (ref 0.61–1.24)
Creatinine, Ser: 0.98 mg/dL (ref 0.61–1.24)
GFR, Estimated: >60 mL/min (ref >60)
GFR, Estimated: >60 mL/min (ref >60)
Glucose, Bld: 112 mg/dL — ABNORMAL HIGH (ref 70–99)
Glucose, Bld: 144 mg/dL — ABNORMAL HIGH (ref 70–99)
Potassium: 4.5 mmol/L (ref 3.5–5.1)
Potassium: 4.3 mmol/L (ref 3.5–5.1)
Sodium: 135 mmol/L (ref 135–145)
Sodium: 134 mmol/L — ABNORMAL LOW (ref 135–145)

## 2020-05-10 LAB — GLUCOSE, CAPILLARY
Glucose-Capillary: 103 mg/dL — ABNORMAL HIGH (ref 70–99)
Glucose-Capillary: 104 mg/dL — ABNORMAL HIGH (ref 70–99)
Glucose-Capillary: 106 mg/dL — ABNORMAL HIGH (ref 70–99)
Glucose-Capillary: 109 mg/dL — ABNORMAL HIGH (ref 70–99)
Glucose-Capillary: 118 mg/dL — ABNORMAL HIGH (ref 70–99)
Glucose-Capillary: 120 mg/dL — ABNORMAL HIGH (ref 70–99)
Glucose-Capillary: 121 mg/dL — ABNORMAL HIGH (ref 70–99)
Glucose-Capillary: 121 mg/dL — ABNORMAL HIGH (ref 70–99)
Glucose-Capillary: 129 mg/dL — ABNORMAL HIGH (ref 70–99)
Glucose-Capillary: 66 mg/dL — ABNORMAL LOW (ref 70–99)

## 2020-05-10 LAB — ECHO INTRAOPERATIVE TEE
Height: 72 in
Weight: 3024 oz

## 2020-05-10 LAB — MAGNESIUM
Magnesium: 2.3 mg/dL (ref 1.7–2.4)
Magnesium: 2 mg/dL (ref 1.7–2.4)

## 2020-05-10 MED ORDER — ENOXAPARIN SODIUM 40 MG/0.4ML ~~LOC~~ SOLN
40.0000 mg | Freq: Every day | SUBCUTANEOUS | Status: DC
  Administered 2020-05-11 – 2020-05-13 (×3): 40 mg via SUBCUTANEOUS
  Filled 2020-05-10 (×3): qty 0.4

## 2020-05-10 MED ORDER — LIDOCAINE 5 % EX PTCH
1.0000 | MEDICATED_PATCH | CUTANEOUS | Status: DC
  Administered 2020-05-10 – 2020-05-13 (×4): 1 via TRANSDERMAL
  Filled 2020-05-10 (×5): qty 1

## 2020-05-10 MED ORDER — DIAZEPAM 2 MG PO TABS
2.0000 mg | ORAL_TABLET | Freq: Once | ORAL | Status: AC
  Administered 2020-05-10: 2 mg via ORAL
  Filled 2020-05-10: qty 1

## 2020-05-10 MED ORDER — ASPIRIN EC 81 MG PO TBEC
81.0000 mg | DELAYED_RELEASE_TABLET | Freq: Every day | ORAL | Status: DC
  Administered 2020-05-11 – 2020-05-14 (×4): 81 mg via ORAL
  Filled 2020-05-10 (×4): qty 1

## 2020-05-10 MED ORDER — ASPIRIN EC 325 MG PO TBEC
325.0000 mg | DELAYED_RELEASE_TABLET | Freq: Every day | ORAL | Status: AC
  Administered 2020-05-10: 325 mg via ORAL
  Filled 2020-05-10: qty 1

## 2020-05-10 MED ORDER — ALBUTEROL SULFATE HFA 108 (90 BASE) MCG/ACT IN AERS: 1.0000 | INHALATION_SPRAY | Freq: Four times a day (QID) | RESPIRATORY_TRACT | Status: DC | PRN

## 2020-05-10 MED ORDER — CALCIUM CARBONATE ANTACID 500 MG PO CHEW: 500.0000 mg | CHEWABLE_TABLET | Freq: Every day | ORAL | Status: DC | PRN

## 2020-05-10 MED ORDER — FENTANYL CITRATE (PF) 100 MCG/2ML IJ SOLN
50.0000 ug | Freq: Once | INTRAMUSCULAR | Status: AC
  Administered 2020-05-10: 50 ug via INTRAVENOUS

## 2020-05-10 MED ORDER — WARFARIN - PHYSICIAN DOSING INPATIENT: Freq: Every day | Status: DC

## 2020-05-10 MED ORDER — DIAZEPAM 5 MG PO TABS
ORAL_TABLET | ORAL | Status: AC
  Filled 2020-05-10: qty 2

## 2020-05-10 MED ORDER — ATORVASTATIN CALCIUM 10 MG PO TABS
10.0000 mg | ORAL_TABLET | Freq: Every day | ORAL | Status: DC
  Administered 2020-05-13 – 2020-05-14 (×2): 10 mg via ORAL
  Filled 2020-05-10 (×2): qty 1

## 2020-05-10 MED ORDER — FENTANYL CITRATE (PF) 100 MCG/2ML IJ SOLN
INTRAMUSCULAR | Status: AC
  Filled 2020-05-10: qty 2

## 2020-05-10 MED ORDER — INSULIN ASPART 100 UNIT/ML ~~LOC~~ SOLN
0.0000 [IU] | SUBCUTANEOUS | Status: DC
  Administered 2020-05-10 – 2020-05-11 (×3): 2 [IU] via SUBCUTANEOUS
  Administered 2020-05-11: 4 [IU] via SUBCUTANEOUS

## 2020-05-10 MED ORDER — KETOROLAC TROMETHAMINE 15 MG/ML IJ SOLN
15.0000 mg | Freq: Once | INTRAMUSCULAR | Status: AC
  Administered 2020-05-10: 15 mg via INTRAVENOUS

## 2020-05-10 MED ORDER — WARFARIN SODIUM 2.5 MG PO TABS
2.5000 mg | ORAL_TABLET | Freq: Every day | ORAL | Status: DC
  Administered 2020-05-10 – 2020-05-11 (×2): 2.5 mg via ORAL
  Filled 2020-05-10 (×2): qty 1

## 2020-05-10 MED ORDER — KETOROLAC TROMETHAMINE 15 MG/ML IJ SOLN
INTRAMUSCULAR | Status: AC
  Filled 2020-05-10: qty 1

## 2020-05-10 MED FILL — Potassium Chloride Inj 2 mEq/ML: INTRAVENOUS | Qty: 40 | Status: AC

## 2020-05-10 MED FILL — Lidocaine HCl Local Preservative Free (PF) Inj 2%: INTRAMUSCULAR | Qty: 15 | Status: AC

## 2020-05-10 MED FILL — Heparin Sodium (Porcine) Inj 1000 Unit/ML: INTRAMUSCULAR | Qty: 30 | Status: AC

## 2020-05-10 NOTE — Hospital Course (Addendum)
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   Patient surgical excision of a lipoma in 2015 and developed severe skin infection.  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  He was noted to have moderate mitral regurgitation without signs of congestive heart failure and seen in consultation by Dr. Servando Snare at that time.  He was treated medically and did well and has been followed carefully ever since by Dr. Irish Lack.  Approximately 1 year ago he was diagnosed with colon cancer and underwent left colectomy.  Final pathology revealed nodal metastases (stage IIIc, T4N2A moderately differentiated adenocarcinoma) and he was treated with postoperative adjuvant chemotherapy using 5-fluorouracil which he completed in May of this year.  Most recent follow-up CT scan of the abdomen and pelvis performed October 2021 revealed no sign of residual disease.   Routine follow-up transthoracic echocardiogram performed January 26, 2020 revealed severe mitral regurgitation with normal left ventricular systolic function but slightly enlarged left ventricular chamber size.  The patient was seen in follow-up with Dr. Irish Lack and subsequently underwent  TEE and diagnostic cardiac catheterization on February 10, 2020.  TEE confirmed the presence of severe mitral regurgitation with an obvious flail segment involving a portion of the posterior leaflet and an eccentric jet of mitral regurgitation.  Left ventricular function appeared normal with ejection fraction estimated 60 to 65%.  Diagnostic cardiac catheterization revealed normal coronary artery anatomy with no significant coronary artery disease.  Pulmonary artery pressures were normal.  Abdominal aortogram revealed no significant atherosclerotic disease.  Cardiothoracic surgical consultation was requested.   Patient is married and lives locally in [place] with his wife.  He works for [employer].  He remains quite active physically and exercises on a regular basis.  He specifically denies any symptoms of exertional shortness of breath or chest discomfort.  He has not noticed any significant change in his exercise tolerance although he admits that he does not push himself terribly hard when he exercises.  He has not had palpitations, dizzy spells, nor syncope.   Patient returns for follow-up today with tentative plans to proceed with elective mitral valve repair later this week.  Patient was originally seen in consultation on February 15, 2020.  Since then he has had problems with recurrent urinary tract infections which are felt to be related to his new medication for diabetes, Jardiance.  He completed a course of oral antibiotics last week and reports no ongoing symptoms of dysuria or urgency.  He otherwise reports no significant problems or complaints and he specifically denies any symptoms of shortness of breath.   Course in Hospital: Mr. Rivero was admitted for elective surgery on 05/09/2020.  He was taken to the operating room where minimally invasive mitral valve repair was carried out.  This included placement of PTFE neocords to P2 segment of the mitral valve and placement of a 28 mm memo 4D  annuloplasty ring.  Following the procedure, he separated from cardiopulmonary bypass without difficulty and was extubated in the operating room.  He was transferred to the ICU in stable condition.  He remained hemodynamically stable.  He initially had a slow junctional rhythm with rates in the high 40s, low 50s.  He was atrially paced with appropriate capture.  He was not given any beta blocking agents.  He was mobilized on the first postoperative day.  Diet was advanced and well-tolerated.  The monitoring lines were removed.  Anticoagulation with Coumadin was initiated on postop day 1.  INR was monitored daily.  CARDIOTHORACIC SURGERY OPERATIVE NOTE   Date of Procedure:                05/09/2020   Preoperative Diagnosis:      Severe Mitral Regurgitation   Postoperative Diagnosis:    Same   Procedure:        Minimally-Invasive Mitral Valve Repair             Complex valvuloplasty including artificial Gore-tex neochord placement x6             Sorin Memo 4D Ring Annuloplasty (size 28 mm, catalog # [id number], serial # [id number])                Surgeon:        Valentina Gu. Roxy Manns, MD   Assistant:       Enid Cutter, PA-C   Anesthesia:    Albertha Ghee, MD   Operative Findings: Fibroelastic deficiency type myxomatous degenerative disease Ruptured primary chordae tendinae with flail segment (P2) of posterior leaflet Type II dysfunction with severe mitral regurgitation Normal left ventricular systolic function No residual mitral regurgitation after successful valve repair                       BRIEF CLINICAL NOTE AND INDICATIONS FOR SURGERY   Patient is a 55 year old male with history of bacterial endocarditis involving the mitral valve in 4944 complicated by mitral regurgitation, 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   Patient surgical excision of a lipoma in 2015 and developed severe skin infection.  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  He was noted to have moderate mitral regurgitation without signs of congestive heart failure and seen in consultation by Dr. Servando Snare at that time.  He was treated medically and did well and has been followed carefully ever since by Dr. Irish Lack.  Approximately 1 year ago he was diagnosed with colon cancer and underwent left colectomy.  Final pathology revealed nodal metastases (stage IIIc, T4N2A moderately differentiated adenocarcinoma) and he was treated with postoperative adjuvant chemotherapy using 5-fluorouracil which he completed in May of this year.  Most recent follow-up CT scan of the abdomen and pelvis performed October 2021 revealed no sign of residual disease.   Routine follow-up transthoracic echocardiogram performed January 26, 2020 revealed severe mitral regurgitation with normal left ventricular systolic function but slightly enlarged left ventricular chamber size.  The patient was seen in follow-up with Dr. Irish Lack and subsequently underwent TEE  and diagnostic cardiac catheterization on February 10, 2020.  TEE confirmed the presence of severe mitral regurgitation with an obvious flail segment involving a portion of the posterior leaflet and an eccentric jet of mitral regurgitation.  Left ventricular function appeared normal with ejection fraction estimated 60 to 65%.  Diagnostic cardiac catheterization revealed normal coronary artery anatomy with no significant coronary  artery disease.  Pulmonary artery pressures were normal.  Abdominal aortogram revealed no significant atherosclerotic disease.  Cardiothoracic surgical consultation was requested.   The patient has been seen in consultation and counseled at length regarding the indications, risks and potential benefits of surgery.  All questions have been answered, and the patient provides full informed consent for the operation as described.

## 2020-05-10 NOTE — Progress Notes (Signed)
TCTS Evening Rounds  POD #1 s/p MVr C/o incisional pain Ambulated VSS, AF BP 139/73   Pulse 80   Temp 99.1 F (37.3 C) (Oral)   Resp (!) 26   Ht 6' (1.829 m)   Wt 92.1 kg   SpO2 96%   BMI 27.54 kg/m  Alert/oriented CTA RRR (paced)   Intake/Output Summary (Last 24 hours) at 05/10/2020 1707 Last data filed at 05/10/2020 1700 Gross per 24 hour  Intake 2931.62 ml  Output 1960 ml  Net 971.62 ml    A/P: doing well Pain control  [name] Z. Orvan Seen, [place]

## 2020-05-10 NOTE — Progress Notes (Signed)
Patient still having 10/10 post op surgical pain in chest area as noted when Dr Orvan Seen rounded this evening. MD paged and informed pain not improved s/p prns given. New orders received.

## 2020-05-10 NOTE — Progress Notes (Addendum)
TCTS DAILY ICU PROGRESS NOTE                   [address]            [place] [address]          [phone]   1 Day Post-Op Procedure(s) (LRB): MINIMALLY INVASIVE MITRAL VALVE REPAIR (MVR) USING LIVANOVA MEMO 4D 28MM MITRAL RING (Right) TRANSESOPHAGEAL ECHOCARDIOGRAM (TEE) (N/A)  Total Length of Stay:  LOS: 1 day   Subjective: Extubated in the OR. Awake and alert, pain well controllled. Stable hemodynamics.     Objective: Vital signs in last 24 hours: Temp:  [97.34 F (36.3 C)-99.5 F (37.5 C)] 98.2 F (36.8 C) (02/10 0700) Pulse Rate:  [77-89] 80 (02/10 0700) Cardiac Rhythm: Atrial paced (02/10 0400) Resp:  [13-22] 19 (02/10 0700) BP: (93-111)/(64-70) 111/70 (02/10 0700) SpO2:  [89 %-100 %] 96 % (02/10 0700) Arterial Line BP: (106-128)/(47-71) 125/56 (02/10 0700) Weight:  [92.1 kg] 92.1 kg (02/10 0500)  Filed Weights   05/08/20 0700 05/09/20 0701 05/10/20 0500  Weight: 87.5 kg 85.7 kg 92.1 kg    Weight change:    Hemodynamic parameters for last 24 hours: PAP: (18-41)/(3-29) 30/12 CO:  [5 L/min-7.5 L/min] 5.3 L/min CI:  [2.4 L/min/m2-3.6 L/min/m2] 2.5 L/min/m2  Intake/Output from previous day: 02/09 0701 - 02/10 0700 In: 6445.8 [P.O.:270; I.V.:3966.5; Blood:366; IV Piggyback:1843.2] Out: 2819 [Urine:1540; Blood:549; Chest Tube:730]  Intake/Output this shift: No intake/output data recorded.  Current Meds: Scheduled Meds: . acetaminophen  1,000 mg Oral Q6H  . aspirin EC  325 mg Oral Daily  . [START ON 05/11/2020] aspirin EC  81 mg Oral Daily  . [START ON 05/13/2020] atorvastatin  10 mg Oral Daily  . bisacodyl  10 mg Oral Daily   Or  . bisacodyl  10 mg Rectal Daily  . Chlorhexidine Gluconate Cloth  6 each Topical Daily  . docusate sodium  200 mg Oral Daily  . [START ON 05/11/2020] enoxaparin (LOVENOX) injection  40 mg Subcutaneous QHS  . insulin aspart  0-24 Units Subcutaneous Q4H  . ketorolac  15 mg Intravenous Q6H  . mouth rinse  15  mL Mouth Rinse BID  . [START ON 05/11/2020] pantoprazole  40 mg Oral Daily  . sodium chloride flush  10-40 mL Intracatheter Q12H  . sodium chloride flush  3 mL Intravenous Q12H  . warfarin  2.5 mg Oral q1600  . Warfarin - Physician Dosing Inpatient   Does not apply q1600   Continuous Infusions: . sodium chloride 250 mL (05/10/20 0654)  . cefUROXime (ZINACEF)  IV Stopped (05/10/20 0042)  . lactated ringers Stopped (05/09/20 1530)  . lactated ringers Stopped (05/09/20 1530)   PRN Meds:.albuterol, calcium carbonate, dextrose, metoprolol tartrate, morphine injection, ondansetron (ZOFRAN) IV, oxyCODONE, sodium chloride flush, sodium chloride flush, traMADol  General appearance: alert, cooperative and no distress Neurologic: intact Heart: regular rate and rhythm Lungs: Breath sounds are clear.  Abdomen: soft, NT.  Extremities: Well perfused, the right femoral cannulation sites are soft and dry.  Lab Results: CBC: Recent Labs    05/09/20 2014 05/09/20 2017 05/10/20 0409 05/10/20 0411  WBC 8.5  --  5.8  --   HGB 11.3*   < > 10.2* 9.5*  HCT 33.7*   < > 28.9* 28.0*  PLT 85*  --  75*  --    < > = values in this interval not displayed.   BMET:  Recent Labs    05/09/20 2014  05/09/20 2017 05/10/20 0409 05/10/20 0411  NA 135   < > 135 138  K 4.7   < > 4.5 4.6  CL 109  --  109  --   CO2 18*  --  19*  --   GLUCOSE 124*  --  112*  --   BUN 14  --  14  --   CREATININE 0.74  --  0.84  --   CALCIUM 7.1*  --  7.4*  --    < > = values in this interval not displayed.    CMET: Lab Results  Component Value Date   WBC 5.8 05/10/2020   HGB 9.5 (L) 05/10/2020   HCT 28.0 (L) 05/10/2020   PLT 75 (L) 05/10/2020   GLUCOSE 112 (H) 05/10/2020   CHOL 116 03/05/2014   TRIG 122 03/05/2014   HDL 21 (L) 03/05/2014   LDLCALC 71 03/05/2014   ALT 29 05/07/2020   AST 30 05/07/2020   NA 138 05/10/2020   K 4.6 05/10/2020   CL 109 05/10/2020   CREATININE 0.84 05/10/2020   BUN 14 05/10/2020    CO2 19 (L) 05/10/2020   TSH 0.549 Test methodology is 3rd generation TSH 01/02/2009   INR 1.3 (H) 05/09/2020   HGBA1C 8.4 (H) 05/07/2020      PT/INR:  Recent Labs    05/09/20 1452  LABPROT 15.4*  INR 1.3*   Radiology: [facility] Chest Port 1 View  Result Date: 05/10/2020 CLINICAL DATA:  Recent mitral valve repair.  Chest tube on the right EXAM: PORTABLE CHEST 1 VIEW COMPARISON:  May 09, 2020 FINDINGS: Swan-Ganz catheter tip is in the proximal right main pulmonary artery. Pacemaker wires are attached to the right heart. There is a chest tube on the right. There is no appreciable pneumothorax. There is airspace opacity in the right lower lung region, increased from 1 day prior, potentially developing pneumonia. There is atelectatic change in the left base. There is cardiomegaly with pulmonary vascularity within normal limits. Status post mitral valve replacement. Surgical clips right cervical-thoracic junction, stable. No adenopathy. No bone lesions. IMPRESSION: Tube and catheter positions as described without pneumothorax. Stable cardiac prominence. Ill-defined airspace opacity right lower lung region, concerning for potential developing pneumonia. Left base atelectasis. Electronically Signed   By: Lowella Grip III M.D.   On: 05/10/2020 07:55   DG Chest Port 1 View  Result Date: 05/09/2020 CLINICAL DATA:  Postop mitral valve repair EXAM: PORTABLE CHEST 1 VIEW COMPARISON:  05/07/2020 FINDINGS: Single frontal view of the chest demonstrates left internal jugular flow directed catheter tip overlying right main pulmonary artery. Mitral valve annuloplasty is noted. Epicardial pacing wires are seen. Right chest tube identified, tip at the apex. Cardiac silhouette is enlarged. Low lung volumes, with increased central vascular congestion. No airspace disease, effusion, or pneumothorax. No acute bony abnormalities. IMPRESSION: 1. Postsurgical changes from mitral valve annuloplasty. 2. Low lung volumes,  with pulmonary vascular congestion. Electronically Signed   By: Randa Ngo M.D.   On: 05/09/2020 15:05     Assessment/Plan: S/P Procedure(s) (LRB): MINIMALLY INVASIVE MITRAL VALVE REPAIR (MVR) USING LIVANOVA MEMO 4D 28MM MITRAL RING (Right) TRANSESOPHAGEAL ECHOCARDIOGRAM (TEE) (N/A)  -POD1 minimally invasive mitral valve repair for severe MR. Stable Vs and hemodynamics since surgery. D/C monitoring lines and Foley catheter. Leave the CT's for drainage today. Mobilize. Begin anticoagulation with coumadin.  -Expected acute blood loss anemia- mild, monitor.  -History of type 2 DM- HgbA1C 8.4 pre-op. Currently controlled with an insulin drip.  Transition to [place] today.  Was on metformin, Jardiance, Actos, and Prandin prior to admission.   -DVT PPX- to start enoxaparin this evening.   -Thrombocytopenia- Bleeding controlled, monitor.    Antony Odea, PA-C [phone] 05/10/2020 8:29 AM   I have seen and examined the patient and agree with the assessment and plan as outlined.  [name], MD 05/10/2020

## 2020-05-10 NOTE — Anesthesia Postprocedure Evaluation (Signed)
Anesthesia Post Note  Patient: [name]  Procedure(s) Performed: MINIMALLY INVASIVE MITRAL VALVE REPAIR (MVR) USING LIVANOVA MEMO 4D 28MM MITRAL RING (Right Chest) TRANSESOPHAGEAL ECHOCARDIOGRAM (TEE) (N/A )     Patient location during evaluation: SICU Anesthesia Type: General Level of consciousness: sedated Pain management: pain level controlled Vital Signs Assessment: post-procedure vital signs reviewed and stable Respiratory status: patient remains intubated per anesthesia plan Cardiovascular status: stable Postop Assessment: no apparent nausea or vomiting Anesthetic complications: no   No complications documented.  Last Vitals:  Vitals:   05/10/20 0800 05/10/20 0900  BP: 118/67 135/73  Pulse: 80 79  Resp: 14 (!) 21  Temp: 36.8 C   SpO2: 96% 99%    Last Pain:  Vitals:   05/10/20 0800  TempSrc:   PainSc: 0-No pain                 Appollonia Klee S

## 2020-05-11 ENCOUNTER — Inpatient Hospital Stay (HOSPITAL_COMMUNITY): Payer: BC Managed Care – PPO

## 2020-05-11 LAB — CBC
HCT: 34 % — ABNORMAL LOW (ref 39.0–52.0)
Hemoglobin: 11.2 g/dL — ABNORMAL LOW (ref 13.0–17.0)
MCH: 32.5 pg (ref 26.0–34.0)
MCHC: 32.9 g/dL (ref 30.0–36.0)
MCV: 98.6 fL (ref 80.0–100.0)
Platelets: 83 10*3/uL — ABNORMAL LOW (ref 150–400)
RBC: 3.45 MIL/uL — ABNORMAL LOW (ref 4.22–5.81)
RDW: 13.2 % (ref 11.5–15.5)
WBC: 7.8 10*3/uL (ref 4.0–10.5)
nRBC: 0 % (ref 0.0–0.2)

## 2020-05-11 LAB — GLUCOSE, CAPILLARY
Glucose-Capillary: 127 mg/dL — ABNORMAL HIGH (ref 70–99)
Glucose-Capillary: 138 mg/dL — ABNORMAL HIGH (ref 70–99)
Glucose-Capillary: 151 mg/dL — ABNORMAL HIGH (ref 70–99)
Glucose-Capillary: 164 mg/dL — ABNORMAL HIGH (ref 70–99)
Glucose-Capillary: 166 mg/dL — ABNORMAL HIGH (ref 70–99)
Glucose-Capillary: 171 mg/dL — ABNORMAL HIGH (ref 70–99)
Glucose-Capillary: 181 mg/dL — ABNORMAL HIGH (ref 70–99)

## 2020-05-11 LAB — BASIC METABOLIC PANEL
Anion gap: 6 (ref 5–15)
BUN: 15 mg/dL (ref 6–20)
CO2: 23 mmol/L (ref 22–32)
Calcium: 8.1 mg/dL — ABNORMAL LOW (ref 8.9–10.3)
Chloride: 106 mmol/L (ref 98–111)
Creatinine, Ser: 0.94 mg/dL (ref 0.61–1.24)
GFR, Estimated: >60 mL/min (ref >60)
Glucose, Bld: 156 mg/dL — ABNORMAL HIGH (ref 70–99)
Potassium: 4.4 mmol/L (ref 3.5–5.1)
Sodium: 135 mmol/L (ref 135–145)

## 2020-05-11 LAB — PROTIME-INR
INR: 1.2 (ref 0.8–1.2)
Prothrombin Time: 14.5 seconds (ref 11.4–15.2)

## 2020-05-11 MED ORDER — FUROSEMIDE 10 MG/ML IJ SOLN
20.0000 mg | Freq: Two times a day (BID) | INTRAMUSCULAR | Status: AC
  Administered 2020-05-11 – 2020-05-12 (×4): 20 mg via INTRAVENOUS
  Filled 2020-05-11 (×4): qty 2

## 2020-05-11 MED ORDER — POTASSIUM CHLORIDE CRYS ER 20 MEQ PO TBCR
20.0000 meq | EXTENDED_RELEASE_TABLET | Freq: Every day | ORAL | Status: DC
  Administered 2020-05-12: 20 meq via ORAL
  Filled 2020-05-11: qty 1

## 2020-05-11 MED ORDER — INSULIN ASPART 100 UNIT/ML ~~LOC~~ SOLN
0.0000 [IU] | Freq: Three times a day (TID) | SUBCUTANEOUS | Status: DC
  Administered 2020-05-11: 3 [IU] via SUBCUTANEOUS
  Administered 2020-05-11: 2 [IU] via SUBCUTANEOUS
  Administered 2020-05-11 – 2020-05-13 (×5): 3 [IU] via SUBCUTANEOUS
  Administered 2020-05-13: 2 [IU] via SUBCUTANEOUS
  Administered 2020-05-13: 8 [IU] via SUBCUTANEOUS
  Administered 2020-05-14: 5 [IU] via SUBCUTANEOUS

## 2020-05-11 MED ORDER — ~~LOC~~ CARDIAC SURGERY, PATIENT & FAMILY EDUCATION: Freq: Once | Status: AC

## 2020-05-11 NOTE — Discharge Instructions (Addendum)
Discharge Instructions:  1. You may shower, please wash incisions daily with soap and water and keep dry.  If you wish to cover wounds with dressing you may do so but please keep clean and change daily.  No tub baths or swimming until incisions have completely healed.  If your incisions become red or develop any drainage please call our office at [phone]  2. No Driving until cleared by Dr. Guy Sandifer office and you are no longer using narcotic pain medications  3. Monitor your weight daily.. Please use the same scale and weigh at same time... If you gain 5-10 lbs in 48 hours with associated lower extremity swelling, please contact our office at [phone]  4. Fever of 101.5 for at least 24 hours with no source, please contact our office at [phone]  5. Activity- up as tolerated, please walk at least 3 times per day.  Avoid strenuous activity, no lifting, pushing, or pulling with your arms over 8-10 lbs for a minimum of 6 weeks  6. If any questions or concerns arise, please do not hesitate to contact our office at [phone]   Information on my medicine - Coumadin   (Warfarin)  This medication education was reviewed with me or my healthcare representative as part of my discharge preparation.   Why was Coumadin prescribed for you? Coumadin was prescribed for you because you have a blood clot or a medical condition that can cause an increased risk of forming blood clots. Blood clots can cause serious health problems by blocking the flow of blood to the heart, lung, or brain. Coumadin can prevent harmful blood clots from forming. As a reminder your indication for Coumadin is:   Blood Clot Prevention After Heart Valve Surgery  What test will check on my response to Coumadin? While on Coumadin (warfarin) you will need to have an INR test regularly to ensure that your dose is keeping you in the desired range. The INR (international normalized ratio) number is calculated from the result of  the laboratory test called prothrombin time (PT).  If an INR APPOINTMENT HAS NOT ALREADY BEEN MADE FOR YOU please schedule an appointment to have this lab work done by your health care provider within 7 days. Your INR goal is a number between:  2 to 3. What  do you need to  know  About  COUMADIN? Take Coumadin (warfarin) exactly as prescribed by your healthcare provider about the same time each day.  DO NOT stop taking without talking to the doctor who prescribed the medication.  Stopping without other blood clot prevention medication to take the place of Coumadin may increase your risk of developing a new clot or stroke.  Get refills before you run out.  What do you do if you miss a dose? If you miss a dose, take it as soon as you remember on the same day then continue your regularly scheduled regimen the next day.  Do not take two doses of Coumadin at the same time.  Important Safety Information A possible side effect of Coumadin (Warfarin) is an increased risk of bleeding. You should call your healthcare provider right away if you experience any of the following: ? Bleeding from an injury or your nose that does not stop. ? Unusual colored urine (red or dark brown) or unusual colored stools (red or black). ? Unusual bruising for unknown reasons. ? A serious fall or if you hit your head (even if there is no bleeding).  Some foods or medicines  interact with Coumadin (warfarin) and might alter your response to warfarin. To help avoid this: ? Eat a balanced diet, maintaining a consistent amount of Vitamin K. ? Notify your provider about major diet changes you plan to make. ? Avoid alcohol or limit your intake to 1 drink for women and 2 drinks for men per day. (1 drink is 5 oz. wine, 12 oz. beer, or 1.5 oz. liquor.)  Make sure that ANY health care provider who prescribes medication for you knows that you are taking Coumadin (warfarin).  Also make sure the healthcare provider who is monitoring  your Coumadin knows when you have started a new medication including herbals and non-prescription products.  Coumadin (Warfarin)  Major Drug Interactions  Increased Warfarin Effect Decreased Warfarin Effect  Alcohol (large quantities) Antibiotics (esp. Septra/Bactrim, Flagyl, Cipro) Amiodarone (Cordarone) Aspirin (ASA) Cimetidine (Tagamet) Megestrol (Megace) NSAIDs (ibuprofen, naproxen, etc.) Piroxicam (Feldene) Propafenone (Rythmol SR) Propranolol (Inderal) Isoniazid (INH) Posaconazole (Noxafil) Barbiturates (Phenobarbital) Carbamazepine (Tegretol) Chlordiazepoxide (Librium) Cholestyramine (Questran) Griseofulvin Oral Contraceptives Rifampin Sucralfate (Carafate) Vitamin K   Coumadin (Warfarin) Major Herbal Interactions  Increased Warfarin Effect Decreased Warfarin Effect  Garlic Ginseng Ginkgo biloba Coenzyme Q10 Green tea St. John's wort    Coumadin (Warfarin) FOOD Interactions  Eat a consistent number of servings per week of foods HIGH in Vitamin K (1 serving =  cup)  Collards (cooked, or boiled & drained) Kale (cooked, or boiled & drained) Mustard greens (cooked, or boiled & drained) Parsley *serving size only =  cup Spinach (cooked, or boiled & drained) Swiss chard (cooked, or boiled & drained) Turnip greens (cooked, or boiled & drained)  Eat a consistent number of servings per week of foods MEDIUM-HIGH in Vitamin K (1 serving = 1 cup)  Asparagus (cooked, or boiled & drained) Broccoli (cooked, boiled & drained, or raw & chopped) Brussel sprouts (cooked, or boiled & drained) *serving size only =  cup Lettuce, raw (green leaf, endive, romaine) Spinach, raw Turnip greens, raw & chopped   These websites have more information on Coumadin (warfarin):  FailFactory.se; VeganReport.com.au;

## 2020-05-11 NOTE — Addendum Note (Signed)
Addendum  created 05/11/20 0829 by Josephine Igo, CRNA   Order list changed, Pharmacy for encounter modified

## 2020-05-11 NOTE — Progress Notes (Signed)
CARDIAC REHAB PHASE I   PRE:  Rate/Rhythm: 80 paced  BP:  Sitting: 124/79      SaO2: 93 RA  MODE:  Ambulation: 470 ft   POST:  Rate/Rhythm: 81 paced  BP:  Sitting: 168/82    SaO2: 98 RA   Pt ambulated 471ft in hallway assist of one with EVA. Pt with some discomfort and SOB upon return from walk. Sats maintained on RA throughout walk. Pt returned to bed. CT hooked back up to section, external pacer next to pt. Encouraged continued IS use and walks. Will continue to follow.  [id number] Rufina Falco, RN BSN 05/11/2020 2:41 PM

## 2020-05-11 NOTE — Plan of Care (Signed)

## 2020-05-11 NOTE — Discharge Summary (Signed)
Physician Discharge Summary  Patient ID: [name] MRN: [id number] DOB/AGE: 1965-08-27 55 y.o.  Admit date: 05/09/2020 Discharge date: 05/14/2020  Admission Diagnoses:  Severe mitral insufficiency Hypertension History of palpitations Dyslipidemia Type 2 diabetes mellitus Solitary kidney, acquired Status post removal of multiple superficial lipomas History of colon cancer   Discharge Diagnoses:   Severe mitral insufficiency S/P minimally invasive mitral valve repair Hypertension History of palpitations Dyslipidemia Type 2 diabetes mellitus Solitary kidney, acquired Status post removal of multiple superficial lipomas History of colon cancer Postoperative junctional rhythm Expected acute blood loss anemia    Discharged Condition: Stable  History of Present Illness: Patient is a 55 year old male with history of bacterial endocarditis involving the mitral valve in 7262 complicated by mitral regurgitation, septic embolization to the brain, and lumbar discitis, hypertension, hyperlipidemia, solitary kidney due to previous nephrectomy for Wilms tumor during childhood, 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   Patient surgical excision of a lipoma in 2015 and developed severe skin infection.  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  He was noted to have moderate mitral regurgitation without signs of congestive heart failure and seen in consultation by Dr. Servando Snare at that time.  He was treated medically and did well and has been followed carefully ever since by Dr. Irish Lack.  Approximately 1 year  ago he was diagnosed with colon cancer and underwent left colectomy.  Final pathology revealed nodal metastases (stage IIIc, T4N2A moderately differentiated adenocarcinoma) and he was treated with postoperative adjuvant chemotherapy using 5-fluorouracil which he completed in May of this year.  Most recent follow-up CT scan of the abdomen and pelvis performed October 2021 revealed no sign of residual disease.   Routine follow-up transthoracic echocardiogram performed January 26, 2020 revealed severe mitral regurgitation with normal left ventricular systolic function but slightly enlarged left ventricular chamber size.  The patient was seen in follow-up with Dr. Irish Lack and subsequently underwent TEE and diagnostic cardiac catheterization on February 10, 2020.  TEE confirmed the presence of severe mitral regurgitation with an obvious flail segment involving a portion of the posterior leaflet and an eccentric jet of mitral regurgitation.  Left ventricular function appeared normal with ejection fraction estimated 60 to 65%.  Diagnostic cardiac catheterization revealed normal coronary artery anatomy with no significant coronary artery disease.  Pulmonary artery pressures were normal.  Abdominal aortogram revealed no significant atherosclerotic disease.  Cardiothoracic surgical consultation was requested.   Patient is married and lives locally in [place] with his wife.  He works for [employer].  He remains quite active physically and exercises on a regular basis.  He specifically denies any symptoms of exertional shortness of breath or chest discomfort.  He has not noticed any significant change in his exercise tolerance although he admits that he does not push himself terribly hard when he exercises.  He has not had palpitations, dizzy spells, nor syncope.   Patient returns for follow-up today with tentative plans to proceed with elective mitral valve repair later this week.  Patient was originally seen in  consultation on February 15, 2020.  Since then he has had problems with recurrent urinary tract infections which are felt to be related to his new medication for diabetes,  Jardiance.  He completed a course of oral antibiotics last week and reports no ongoing symptoms of dysuria or urgency.  He otherwise reports no significant problems or complaints and he specifically denies any symptoms of shortness of breath.   Course in Hospital: [name] was admitted for elective surgery on 05/09/2020.  He was taken to the operating room where minimally invasive mitral valve repair was carried out.  This included placement of PTFE neocords to P2 segment of the mitral valve and placement of a 28 mm memo 4D annuloplasty ring.  Following the procedure, he separated from cardiopulmonary bypass without difficulty and was extubated in the operating room.  He was transferred to the ICU in stable condition.  He remained hemodynamically stable.  He initially had a slow junctional rhythm with rates in the high 40s, low 50s.  He was atrially paced with appropriate capture.  He was not given any beta blocking agents.  He was mobilized on the first postoperative day.  Diet was advanced and well-tolerated.  The monitoring lines were removed.  Glucose was monitored and sliding scale insulin was provided as required.  Anticoagulation with Coumadin was initiated on postop day 1.  INR was monitored daily.  He progressed to independent mobility and was tolerating a low-carb diet by the time of his discharge.  He was also weaned from the supplemental oxygen without difficulty.   He was felt surgically stable for transfer from the ICU to 4E on 02/11. Epicardial pacing wires then chest tubes were removed on 02/12. He went into ? A fib with RVR vs accelerated junctional later on 02/12. He was put on Lopressor. Because his INR was not increasing, Coumadin dose was increased to 5 mg. His last INR on 02/14 was 1.3. His glucose remained well  controlled and he will be restarted on Jardiance, Actos, and Metformin at discharge. Incisions were healing with no sign of complication at the time of discharge. He is felt surgically stable for discharge today.  Consults: None  Significant Diagnostic Studies:   CLINICAL DATA:  Chest tube  EXAM: PORTABLE CHEST 1 VIEW  COMPARISON:  05/10/2020  FINDINGS: 0509 hours. Low lung volumes. The cardio pericardial silhouette is enlarged. Vascular congestion again noted with similar patchy basilar airspace disease bilaterally. Right chest tube remains in place without evidence for pneumothorax. Left pulmonary artery catheter is been removed in the interval with left IJ sheath remaining.  IMPRESSION: 1. Interval removal of left pulmonary artery catheter without evidence for pneumothorax. 2. No substantial change in cardiopulmonary exam.   Electronically Signed   By: Misty Stanley M.D.   On: 05/11/2020 07:52  Treatments:  CARDIOTHORACIC SURGERY OPERATIVE NOTE   Date of Procedure:                05/09/2020   Preoperative Diagnosis:      Severe Mitral Regurgitation   Postoperative Diagnosis:    Same   Procedure:         Minimally-Invasive Mitral Valve Repair             Complex valvuloplasty including artificial Gore-tex neochord placement x6             Sorin Memo 4D Ring Annuloplasty (size 28 mm, catalog # [id number], serial # [id number])                Surgeon:        Valentina Gu. Roxy Manns, MD   Assistant:       Enid Cutter, PA-C  Anesthesia:    Albertha Ghee, MD   Operative Findings: ? Fibroelastic deficiency type myxomatous degenerative disease ? Ruptured primary chordae tendinae with flail segment (P2) of posterior leaflet ? Type II dysfunction with severe mitral regurgitation ? Normal left ventricular systolic function ? 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   Patient surgical excision of a lipoma in 2015 and developed severe skin infection.  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  He was noted to have moderate mitral regurgitation without signs of congestive heart failure and seen in consultation by Dr. Servando Snare at that time.  He was treated medically and did well and has been followed carefully ever since by Dr. Irish Lack.  Approximately 1 year ago he was diagnosed with colon cancer and underwent left colectomy.  Final pathology revealed nodal metastases (stage IIIc, T4N2A moderately differentiated adenocarcinoma) and he was treated with postoperative adjuvant chemotherapy using 5-fluorouracil which he completed in May of this year.  Most recent follow-up CT scan of the abdomen and pelvis performed October 2021 revealed no sign of residual disease.   Routine follow-up transthoracic echocardiogram performed January 26, 2020 revealed severe mitral regurgitation with normal left ventricular systolic function but slightly enlarged left ventricular chamber size.  The patient was seen in follow-up with Dr. Irish Lack and subsequently underwent TEE and diagnostic cardiac catheterization on February 10, 2020.  TEE confirmed the presence of  severe mitral regurgitation with an obvious flail segment involving a portion of the posterior leaflet and an eccentric jet of mitral regurgitation.  Left ventricular function appeared normal with ejection fraction estimated 60 to 65%.  Diagnostic cardiac catheterization revealed normal coronary artery anatomy with no significant coronary artery disease.  Pulmonary artery pressures were normal.  Abdominal aortogram revealed no significant atherosclerotic disease.  Cardiothoracic surgical consultation was requested.   The patient has been seen in consultation and counseled at length regarding the indications, risks and potential benefits of surgery.  All questions have been answered, and the patient provides full informed consent for the operation as described.  Discharge Exam: Blood pressure 131/75, pulse 81, temperature 99 F (37.2 C), temperature source Oral, resp. rate 20, height 6' (1.829 m), weight 85.5 kg, SpO2 96 %.  General appearance: alert, no distress Neurologic: intact Heart: Had some a-fib yesterday, oral amiodarone load started. Now in regular rhythm but I do not see p-waves.  Lungs: Breath sounds are clear, shallow. Abdomen: soft, NT.  Extremities: Well perfused.  Disposition:    Discharged to home in stable condition.  Discharge Instructions    Amb Referral to Cardiac Rehabilitation   Complete by: As directed    Diagnosis: Valve Repair   Valve: Mitral   After initial evaluation and assessments completed: Virtual Based Care may be  provided alone or in conjunction with Phase 2 Cardiac Rehab based on patient barriers.: Yes     Allergies as of 05/14/2020   No Known Allergies     Medication List    STOP taking these medications   acetaminophen 500 MG tablet Commonly known as: TYLENOL     TAKE these medications   albuterol 108 (90 Base) MCG/ACT inhaler Commonly known as: VENTOLIN HFA Inhale 1 puff into the lungs every 6 (six) hours as needed for wheezing or shortness  of breath.   amiodarone 200 MG tablet Commonly known as: PACERONE Take 1 tablet (200 mg total) by mouth 2 (two) times daily after a meal.   aspirin 81 MG EC tablet Take 1 tablet (81 mg total) by mouth daily. Swallow whole. Start taking on: May 15, 2020   atorvastatin 10 MG tablet Commonly known as: LIPITOR Take 1 tablet (10 mg total) by mouth daily.   calcium carbonate 500 MG chewable tablet Commonly known as: TUMS - dosed in mg elemental calcium Chew 500 mg by mouth daily as needed for indigestion or heartburn.   Fish Oil 1000 MG Caps Take 1,000 mg by mouth daily.   fluticasone 50 MCG/ACT nasal spray Commonly known as: FLONASE Place 1 spray into both nostrils daily as needed for allergies or rhinitis.   Jardiance 10 MG Tabs tablet Generic drug: empagliflozin Take 10 mg by mouth daily.   lisinopril 2.5 MG tablet Commonly known as: ZESTRIL Take 1 tablet (2.5 mg total) by mouth daily.   loratadine 10 MG tablet Commonly known as: CLARITIN Take 10 mg by mouth daily as needed for allergies.   metFORMIN 1000 MG tablet Commonly known as: GLUCOPHAGE Take 1 tablet (1,000 mg total) by mouth 2 (two) times daily.   mometasone 220 MCG/INH inhaler Commonly known as: ASMANEX Inhale 1 puff into the lungs daily.   ONE TOUCH ULTRA TEST test strip Generic drug: glucose blood 1 each by Other route as needed (GLUCOSE).   oxyCODONE-acetaminophen 5-325 MG tablet Commonly known as: Percocet Take 1 tablet by mouth every 4 (four) hours as needed for up to 5 days for severe pain.   pioglitazone 15 MG tablet Commonly known as: ACTOS Take 15 mg by mouth daily.   repaglinide 1 MG tablet Commonly known as: PRANDIN Take 1 mg by mouth 2 (two) times daily before a meal.   Toprol XL 25 MG 24 hr tablet Generic drug: metoprolol succinate Take 25 mg by mouth daily.   warfarin 5 MG tablet Commonly known as: COUMADIN Take 1 tablet (5 mg total) by mouth daily at 4 PM. Or as directed by  the coumadin [place] Office. Go on 05/17/2020.   Specialty: Cardiology Why: Your appointment is at 10:30 AM for an INR blood test (for Coumadin management) Contact information: [address] [place] [phone]       [facility] [place]. Go on 05/21/2020.   Specialty: Cardiothoracic Surgery Why: Your appointment is at 10:30 AM for suture removal Contact information: [address] [place], [place] [phone]       Deberah Pelton, NP. Go on 05/29/2020.   Specialty: Cardiology Why: Your appointment is at 9:45 AM for cardiology follow-up Contact information: [address] [place] [address] [phone]        [facility] [place].  Go on 06/11/2020.   Specialty: Cardiothoracic Surgery Why: Your appointment is on Monday, 06/11/2020 at 1 PM for surgery follow-up.  Please arrive 30 minutes early for chest x-ray to be performed by [facility] located on the first floor of the same building. Contact information: [place], [place] [facility]. Go on 06/25/2020.   Specialty: Cardiology Why: Your postoperative follow-up echo is scheduled for Monday, 06/25/2020 at 10:45 AM. Contact information: [address] [id number] [place]       Orpah Melter, MD. Call.   Specialty: Family Medicine Why: For a follow up appointment regarding further diabetes management and surveillance of HGA1C 8.4 Contact information: [address] [place] [address] [phone]             The patient has been discharged on:   1.Beta Blocker:  Yes [ x  ]                              No   [   ]                               If No, reason:   2.Ace Inhibitor/ARB: Yes [ x  ]                                     No  [    ]                                     If No, reason:  3.Statin:   Yes [ x  ]                  No  [   ]                  If No, reason:  4.Ecasa:  Yes  [ x  ]                  No   [   ]                  If No, reason:     Signed: Antony Odea, PA-C 05/14/2020, 9:45 AM

## 2020-05-11 NOTE — Progress Notes (Signed)
Mobility Specialist - Progress Note   05/11/20 1623  Mobility  Activity Ambulated in hall  Level of Assistance Standby assist, set-up cues, supervision of patient - no hands on  Assistive Device Four wheel walker (EVA)  Distance Ambulated (ft) 470 ft  Mobility Response Tolerated well  Mobility performed by Mobility specialist  $Mobility charge 1 Mobility   Pre-mobility: 81 HR During mobility: 101 HR Post-mobility: 80 HR  Pt asx throughout ambulation. No assist for bed mobility. Pt to recliner after walk.   Pricilla Handler Mobility Specialist Mobility Specialist Phone: [phone]

## 2020-05-11 NOTE — Progress Notes (Addendum)
[facility] DAILY ICU PROGRESS NOTE                   [address]            [place] [address]          [phone]   2 Days Post-Op Procedure(s) (LRB): MINIMALLY INVASIVE MITRAL VALVE REPAIR (MVR) USING LIVANOVA MEMO 4D 28MM MITRAL RING (Right) TRANSESOPHAGEAL ECHOCARDIOGRAM (TEE) (N/A)  Total Length of Stay:  LOS: 2 days   Subjective:  Awake and alert. Perspiring but says he is not uncomfortable.  Pain control is better. Passing gas, no BM yet.      Objective: Vital signs in last 24 hours: Temp:  [98.24 F (36.8 C)-99.1 F (37.3 C)] 98.7 F (37.1 C) (02/11 0351) Pulse Rate:  [79-81] 80 (02/11 0700) Cardiac Rhythm: Atrial paced (02/11 0607) Resp:  [13-29] 19 (02/11 0700) BP: (118-149)/(67-85) 133/82 (02/11 0700) SpO2:  [93 %-99 %] 95 % (02/11 0700) Arterial Line BP: (132-155)/(57-70) 155/64 (02/10 1000) Weight:  [91.4 kg] 91.4 kg (02/11 0607)  Filed Weights   05/09/20 0701 05/10/20 0500 05/11/20 0607  Weight: 85.7 kg 92.1 kg 91.4 kg    Weight change: 5.67 kg   Hemodynamic parameters for last 24 hours: PAP: (30)/(11) 30/11  Intake/Output from previous day: 02/10 0701 - 02/11 0700 In: 696.8 [P.O.:480; I.V.:16.8; IV Piggyback:200] Out: 1735 [Urine:1125; Chest Tube:610]  Intake/Output this shift: No intake/output data recorded.  Current Meds: Scheduled Meds: . acetaminophen  1,000 mg Oral Q6H  . aspirin EC  81 mg Oral Daily  . [START ON 05/13/2020] atorvastatin  10 mg Oral Daily  . bisacodyl  10 mg Oral Daily   Or  . bisacodyl  10 mg Rectal Daily  . Chlorhexidine Gluconate Cloth  6 each Topical Daily  . docusate sodium  200 mg Oral Daily  . enoxaparin (LOVENOX) injection  40 mg Subcutaneous QHS  . furosemide  20 mg Intravenous BID  . insulin aspart  0-15 Units Subcutaneous TID WC  . lidocaine  1 patch Transdermal Q24H  . mouth rinse  15 mL Mouth Rinse BID  . pantoprazole  40 mg Oral Daily  . [START ON 05/12/2020] potassium chloride  20 mEq  Oral Daily  . sodium chloride flush  10-40 mL Intracatheter Q12H  . sodium chloride flush  3 mL Intravenous Q12H  . warfarin  2.5 mg Oral q1600  . Warfarin - Physician Dosing Inpatient   Does not apply q1600   Continuous Infusions: . sodium chloride 250 mL (05/10/20 0654)  . cefUROXime (ZINACEF)  IV Stopped (05/11/20 0059)  . lactated ringers Stopped (05/09/20 1530)   PRN Meds:.albuterol, calcium carbonate, dextrose, metoprolol tartrate, morphine injection, ondansetron (ZOFRAN) IV, oxyCODONE, sodium chloride flush, sodium chloride flush, traMADol  General appearance: alert, cooperative and no distress Neurologic: intact Heart: paced, intrinsic rhythm is JR in 50's. Lungs: Breath sounds are clear, shallow. CXR shows slight improvement in aeration but still has some patchy densities c/w ATX. CT's drained 672ml watery fluid past 24 hours.  Abdomen: soft, NT.  Extremities: Well perfused.  Lab Results: CBC: Recent Labs    05/10/20 1700 05/11/20 0200  WBC 7.6 7.8  HGB 11.6* 11.2*  HCT 33.1* 34.0*  PLT 83* 83*   BMET:  Recent Labs    05/10/20 1700 05/11/20 0200  NA 134* 135  K 4.3 4.4  CL 105 106  CO2 19* 23  GLUCOSE 144* 156*  BUN 16 15  CREATININE 0.98  0.94  CALCIUM 8.1* 8.1*    CMET: Lab Results  Component Value Date   WBC 5.8 05/10/2020   HGB 9.5 (L) 05/10/2020   HCT 28.0 (L) 05/10/2020   PLT 75 (L) 05/10/2020   GLUCOSE 112 (H) 05/10/2020   CHOL 116 03/05/2014   TRIG 122 03/05/2014   HDL 21 (L) 03/05/2014   LDLCALC 71 03/05/2014   ALT 29 05/07/2020   AST 30 05/07/2020   NA 138 05/10/2020   K 4.6 05/10/2020   CL 109 05/10/2020   CREATININE 0.84 05/10/2020   BUN 14 05/10/2020   CO2 19 (L) 05/10/2020   TSH 0.549 Test methodology is 3rd generation TSH 01/02/2009   INR 1.3 (H) 05/09/2020   HGBA1C 8.4 (H) 05/07/2020      PT/INR:  Recent Labs    05/11/20 0200  LABPROT 14.5  INR 1.2   Radiology: No results found.   Assessment/Plan: S/P  Procedure(s) (LRB): MINIMALLY INVASIVE MITRAL VALVE REPAIR (MVR) USING LIVANOVA MEMO 4D 28MM MITRAL RING (Right) TRANSESOPHAGEAL ECHOCARDIOGRAM (TEE) (N/A)  -POD2 minimally invasive mitral valve repair for severe MR. Stable VS and hemodynamics since surgery.Leave the CT's for drainage for now. Advance activity and continue anticoagulation with coumadin.  -Expected acute blood loss anemia- mild, monitor.  -History of type 2 DM- HgbA1C 8.4 pre-op.Continue SSI.  Was on metformin, Jardiance, Actos, and Prandin prior to admission.   -DVT PPX-on enoxaparin daily  -Thrombocytopenia- Bleeding controlled, plt count trending up  Antony Odea, PA-C [phone] 05/11/2020 7:21 AM     I have seen and examined the patient and agree with the assessment and plan as outlined.  Still junctional rhythm under pacer - continue AAI pacing for now.  Mobilize.  Gentle diuresis. Transfer 4E  [name], MD 05/11/2020 8:05 AM

## 2020-05-12 LAB — GLUCOSE, CAPILLARY
Glucose-Capillary: 179 mg/dL — ABNORMAL HIGH (ref 70–99)
Glucose-Capillary: 227 mg/dL — ABNORMAL HIGH (ref 70–99)
Glucose-Capillary: 174 mg/dL — ABNORMAL HIGH (ref 70–99)
Glucose-Capillary: 182 mg/dL — ABNORMAL HIGH (ref 70–99)
Glucose-Capillary: 239 mg/dL — ABNORMAL HIGH (ref 70–99)

## 2020-05-12 LAB — PROTIME-INR
INR: 1.1 (ref 0.8–1.2)
Prothrombin Time: 13.7 seconds (ref 11.4–15.2)

## 2020-05-12 LAB — BASIC METABOLIC PANEL
Anion gap: 13 (ref 5–15)
BUN: 13 mg/dL (ref 6–20)
CO2: 19 mmol/L — ABNORMAL LOW (ref 22–32)
Calcium: 8.7 mg/dL — ABNORMAL LOW (ref 8.9–10.3)
Chloride: 103 mmol/L (ref 98–111)
Creatinine, Ser: 1.06 mg/dL (ref 0.61–1.24)
GFR, Estimated: >60 mL/min (ref >60)
Glucose, Bld: 174 mg/dL — ABNORMAL HIGH (ref 70–99)
Potassium: 3.8 mmol/L (ref 3.5–5.1)
Sodium: 135 mmol/L (ref 135–145)

## 2020-05-12 LAB — CBC
HCT: 36.6 % — ABNORMAL LOW (ref 39.0–52.0)
Hemoglobin: 12.6 g/dL — ABNORMAL LOW (ref 13.0–17.0)
MCH: 32.6 pg (ref 26.0–34.0)
MCHC: 34.4 g/dL (ref 30.0–36.0)
MCV: 94.8 fL (ref 80.0–100.0)
Platelets: 99 10*3/uL — ABNORMAL LOW (ref 150–400)
RBC: 3.86 MIL/uL — ABNORMAL LOW (ref 4.22–5.81)
RDW: 12.8 % (ref 11.5–15.5)
WBC: 7.8 10*3/uL (ref 4.0–10.5)
nRBC: 0 % (ref 0.0–0.2)

## 2020-05-12 LAB — MAGNESIUM: Magnesium: 1.5 mg/dL — ABNORMAL LOW (ref 1.7–2.4)

## 2020-05-12 MED ORDER — MAGNESIUM OXIDE 400 (241.3 MG) MG PO TABS
400.0000 mg | ORAL_TABLET | Freq: Every day | ORAL | Status: DC
  Administered 2020-05-12 – 2020-05-14 (×3): 400 mg via ORAL
  Filled 2020-05-12 (×3): qty 1

## 2020-05-12 MED ORDER — POLYETHYLENE GLYCOL 3350 17 G PO PACK
17.0000 g | PACK | Freq: Every day | ORAL | Status: DC | PRN
  Filled 2020-05-12: qty 1

## 2020-05-12 MED ORDER — POTASSIUM CHLORIDE CRYS ER 20 MEQ PO TBCR
40.0000 meq | EXTENDED_RELEASE_TABLET | Freq: Once | ORAL | Status: AC
  Administered 2020-05-12: 40 meq via ORAL
  Filled 2020-05-12: qty 2

## 2020-05-12 MED ORDER — METOPROLOL TARTRATE 12.5 MG HALF TABLET
12.5000 mg | ORAL_TABLET | Freq: Once | ORAL | Status: AC
  Administered 2020-05-12: 12.5 mg via ORAL
  Filled 2020-05-12: qty 1

## 2020-05-12 MED ORDER — WARFARIN SODIUM 5 MG PO TABS
5.0000 mg | ORAL_TABLET | Freq: Every day | ORAL | Status: DC
  Administered 2020-05-12 – 2020-05-13 (×2): 5 mg via ORAL
  Filled 2020-05-12 (×2): qty 1

## 2020-05-12 MED ORDER — METOPROLOL TARTRATE 12.5 MG HALF TABLET
12.5000 mg | ORAL_TABLET | Freq: Two times a day (BID) | ORAL | Status: DC
  Administered 2020-05-13 – 2020-05-14 (×3): 12.5 mg via ORAL
  Filled 2020-05-12 (×3): qty 1

## 2020-05-12 NOTE — Progress Notes (Signed)
Mobility Specialist: Progress Note   05/12/20 1652  Mobility  Activity Ambulated in hall  Level of Assistance Modified independent, requires aide device or extra time  Assistive Device Front wheel walker  Distance Ambulated (ft) 470 ft  Mobility Response Tolerated well  Mobility performed by Mobility specialist  Bed Position Chair  $Mobility charge 1 Mobility   Pre-Mobility: 78 HR, 95% SpO2 Post-Mobility: 81 HR, 100% SpO2  Pt asx during ambulation. Pt requesting to walk one more time today. Informed pt he could walk with his wife when he is ready.   [facility] [place] Mobility Specialist Mobility Specialist Phone: [phone]

## 2020-05-12 NOTE — Progress Notes (Signed)
EPW removed.  Pt stable, no complaints.  VSS.

## 2020-05-12 NOTE — Progress Notes (Signed)
2 chest tubes removed.  Pt tolerated well, VSS, bed rest.  Drsg clean, dry and intact.  Will continue to monitor.

## 2020-05-12 NOTE — Progress Notes (Addendum)
      [address]       [place] [address]             [phone]        3 Days Post-Op Procedure(s) (LRB): MINIMALLY INVASIVE MITRAL VALVE REPAIR (MVR) USING LIVANOVA MEMO 4D 28MM MITRAL RING (Right) TRANSESOPHAGEAL ECHOCARDIOGRAM (TEE) (N/A)  Subjective: Patient already walked this am. He is passing flatus but no bowel movement yet.  Objective: Vital signs in last 24 hours: Temp:  [97.6 F (36.4 C)-99.3 F (37.4 C)] 99.1 F (37.3 C) (02/12 0844) Pulse Rate:  [79-96] 84 (02/12 0844) Cardiac Rhythm: Atrial paced (02/11 1900) Resp:  [16-33] 20 (02/12 0844) BP: (128-163)/(71-85) 144/85 (02/12 0844) SpO2:  [92 %-98 %] 98 % (02/12 0844) Weight:  [88.5 kg-89.3 kg] 88.5 kg (02/12 0446)  Pre op weight 85.7 kg Current Weight  05/12/20 88.5 kg       Intake/Output from previous day: 02/11 0701 - 02/12 0700 In: 700 [P.O.:600; IV Piggyback:100] Out: 2785 [Urine:2595; Chest Tube:190]   Physical Exam:  Cardiovascular: RRR, flow murmur Pulmonary: Slightly diminished right basilar breath sounds Abdomen: Soft, non tender, bowel sounds present. Extremities: Mild bilateral lower extremity edema. Wounds: Aquacel removed and wound is clean and dry.  No erythema or signs of infection. Chest tubes: to suction, no air leak  Lab Results: CBC: Recent Labs    05/11/20 0200 05/12/20 0534  WBC 7.8 7.8  HGB 11.2* 12.6*  HCT 34.0* 36.6*  PLT 83* 99*   BMET:  Recent Labs    05/11/20 0200 05/12/20 0534  NA 135 135  K 4.4 3.8  CL 106 103  CO2 23 19*  GLUCOSE 156* 174*  BUN 15 13  CREATININE 0.94 1.06  CALCIUM 8.1* 8.7*    PT/INR:  Lab Results  Component Value Date   INR 1.1 05/12/2020   INR 1.2 05/11/2020   INR 1.3 (H) 05/09/2020   ABG:  INR: Will add last result for INR, ABG once components are confirmed Will add last 4 CBG results once components are confirmed  Assessment/Plan:  1. CV - He was paced at 80 this am. External pacer turned  down then off. Previous junctional rhythm. SR with HR in the 70's. On 2.5 mg of Coumadin. INR 1.1. Will give 5 mg tonight. 2.  Pulmonary - Chest tubes to suction, no air leak and with 190 cc last 24 hours. No CXR ordered for today. Check PA/LAT CXR in am. Encourage incentive spirometer. 3. Volume Overload - On Lasix 20 mg IV bid 4.  Expected post op acute blood loss anemia - H and H this am stable at 12.6 and 36.6 5. DM-CBGs 181/179/227. On Insulin PRN . Will restart Metformin and Jardiance at discharge. Pre op HGA1C 8.4. He will need close medical follow up after discharge. 6. Supplement magnesium and potassium 7. Thrombocytopenia-platelets this am increased to 99,000 8. Remove EPW then chest tubes 9. Possible discharge 1-2 days  Sharalyn Ink ZimmermanPA-C 05/12/2020,8:53 AM   I have seen and examined the patient and agree with the assessment and plan as outlined.  Tentatively plan d/c home tomorrow.  [name], MD 05/12/2020 11:01 AM

## 2020-05-12 NOTE — Progress Notes (Signed)
Pt went into Afib RVR rates in the 170s after bath.  Attempted to catch on EKG but rhythm broke to NSR in the 80s before.  Pt felt fluttering in his chest and was SOB but no symptoms have resolved.  Dr. Ricard Dillon notified, orders received for Metoprolol 12.5 once now.  Will continue to monitor.

## 2020-05-12 NOTE — Progress Notes (Signed)
Cardiac Rehab Phase I  [phone] Second attempt to ambulate with patient. Previously patient had just completed walk independently and was using bedside commode. Patient now on bed rest after EPW removal. Patient states he will walk after his rest time is complete. Reviewed post surgery education with patient including restrictions, risk factor modification, and activity progression. Patient states that his diabetes was well managed prior to his cancer treatment. Diabetic and heart healthy diet handout given, exercise guidelines given. Discussed phase 2 cardiac rehab with patient, and permission given to send referral to program at [facility].  David Passer, MS, ACSM CEP

## 2020-05-13 ENCOUNTER — Inpatient Hospital Stay (HOSPITAL_COMMUNITY): Payer: BC Managed Care – PPO

## 2020-05-13 LAB — GLUCOSE, CAPILLARY
Glucose-Capillary: 174 mg/dL — ABNORMAL HIGH (ref 70–99)
Glucose-Capillary: 192 mg/dL — ABNORMAL HIGH (ref 70–99)
Glucose-Capillary: 193 mg/dL — ABNORMAL HIGH (ref 70–99)
Glucose-Capillary: 202 mg/dL — ABNORMAL HIGH (ref 70–99)
Glucose-Capillary: 226 mg/dL — ABNORMAL HIGH (ref 70–99)
Glucose-Capillary: 285 mg/dL — ABNORMAL HIGH (ref 70–99)

## 2020-05-13 LAB — PROTIME-INR
INR: 1.2 (ref 0.8–1.2)
Prothrombin Time: 14.9 seconds (ref 11.4–15.2)

## 2020-05-13 MED ORDER — FUROSEMIDE 40 MG PO TABS
40.0000 mg | ORAL_TABLET | Freq: Every day | ORAL | Status: DC
  Administered 2020-05-13 – 2020-05-14 (×2): 40 mg via ORAL
  Filled 2020-05-13 (×2): qty 1

## 2020-05-13 MED ORDER — AMIODARONE HCL 200 MG PO TABS
200.0000 mg | ORAL_TABLET | Freq: Two times a day (BID) | ORAL | Status: DC
  Administered 2020-05-14: 200 mg via ORAL
  Filled 2020-05-13: qty 1

## 2020-05-13 MED ORDER — AMIODARONE HCL 200 MG PO TABS
400.0000 mg | ORAL_TABLET | Freq: Two times a day (BID) | ORAL | Status: AC
  Administered 2020-05-13 (×2): 400 mg via ORAL
  Filled 2020-05-13 (×2): qty 2

## 2020-05-13 MED ORDER — POTASSIUM CHLORIDE CRYS ER 20 MEQ PO TBCR
20.0000 meq | EXTENDED_RELEASE_TABLET | Freq: Every day | ORAL | Status: DC
  Administered 2020-05-13 – 2020-05-14 (×2): 20 meq via ORAL
  Filled 2020-05-13 (×2): qty 1

## 2020-05-13 NOTE — Progress Notes (Signed)
Mobility Specialist: Progress Note   05/13/20 1209  Mobility  Activity Ambulated in hall  Level of Assistance Independent  Assistive Device Front wheel walker  Distance Ambulated (ft) 1030 ft  Mobility Response Tolerated well  Mobility performed by Mobility specialist  Bed Position Chair  $Mobility charge 1 Mobility   Pre-Mobility: 72 HR Post-Mobility: 90 HR  Pt asx during ambulation.   [facility] [name] Mobility Specialist Mobility Specialist Phone: [phone]

## 2020-05-13 NOTE — Progress Notes (Addendum)
      [address]       [place] [address]             [phone]        4 Days Post-Op Procedure(s) (LRB): MINIMALLY INVASIVE MITRAL VALVE REPAIR (MVR) USING LIVANOVA MEMO 4D 28MM MITRAL RING (Right) TRANSESOPHAGEAL ECHOCARDIOGRAM (TEE) (N/A)  Subjective: Patient walking around in room. He had 2 bowel movements  Objective: Vital signs in last 24 hours: Temp:  [98.1 F (36.7 C)-99.1 F (37.3 C)] 98.2 F (36.8 C) (02/13 0355) Pulse Rate:  [60-86] 60 (02/13 0355) Cardiac Rhythm: Normal sinus rhythm (02/12 1900) Resp:  [16-20] 16 (02/13 0355) BP: (121-144)/(71-89) 129/75 (02/13 0355) SpO2:  [95 %-100 %] 100 % (02/13 0355) Weight:  [86 kg] 86 kg (02/13 0404)  Pre op weight 85.7 kg Current Weight  05/13/20 86 kg       Intake/Output from previous day: 02/12 0701 - 02/13 0700 In: 3 [I.V.:3] Out: 1450 [Urine:1450]   Physical Exam:  Cardiovascular: IRRR, flow murmur Pulmonary: Slightly diminished right basilar breath sounds Abdomen: Soft, non tender, bowel sounds present. Extremities: Mild bilateral lower extremity edema. Wounds: Aquacel removed and wound is clean and dry.  No erythema or signs of infection. Chest tubes: to suction, no air leak  Lab Results: CBC: Recent Labs    05/11/20 0200 05/12/20 0534  WBC 7.8 7.8  HGB 11.2* 12.6*  HCT 34.0* 36.6*  PLT 83* 99*   BMET:  Recent Labs    05/11/20 0200 05/12/20 0534  NA 135 135  K 4.4 3.8  CL 106 103  CO2 23 19*  GLUCOSE 156* 174*  BUN 15 13  CREATININE 0.94 1.06  CALCIUM 8.1* 8.7*    PT/INR:  Lab Results  Component Value Date   INR 1.2 05/13/2020   INR 1.1 05/12/2020   INR 1.2 05/11/2020   ABG:  INR: Will add last result for INR, ABG once components are confirmed Will add last 4 CBG results once components are confirmed  Assessment/Plan:  1. CV - A fib with RVR (? Accelerated junctional) yesterday. HR in the high 90's low 100's this am. On Lopressor 12.5 mg bid and  Coumadin. INR 1.2. Will give 5 mg again tonight. 2.  Pulmonary - On room air.  PA/LAT CXR shows small b/l pleural effusions. Encourage incentive spirometer. 3. Volume Overload - Will give Lasix 40 mg daily 4.  Expected post op acute blood loss anemia - H and H this am stable at 12.6 and 36.6 5. DM-CBGs 239/202/174. On Insulin PRN . Will restart Metformin and Jardiance at discharge. Pre op HGA1C 8.4. He will need close medical follow up after discharge. 6. Thrombocytopenia-platelets yesterday increased to 99,000 7. Probable discharge in am   Sharalyn Ink [facility] 05/13/2020,7:38 AM   I have seen and examined the patient and agree with the assessment and plan as outlined.  Another brief episode AF this morning.  Will start amiodarone.  Anticipate d/c home tomorrow  [name], MD 05/13/2020 10:54 AM

## 2020-05-14 LAB — PROTIME-INR
INR: 1.3 — ABNORMAL HIGH (ref 0.8–1.2)
Prothrombin Time: 15.9 seconds — ABNORMAL HIGH (ref 11.4–15.2)

## 2020-05-14 LAB — CBC
HCT: 33.1 % — ABNORMAL LOW (ref 39.0–52.0)
Hemoglobin: 11.2 g/dL — ABNORMAL LOW (ref 13.0–17.0)
MCH: 32.5 pg (ref 26.0–34.0)
MCHC: 33.8 g/dL (ref 30.0–36.0)
MCV: 95.9 fL (ref 80.0–100.0)
Platelets: 119 10*3/uL — ABNORMAL LOW (ref 150–400)
RBC: 3.45 MIL/uL — ABNORMAL LOW (ref 4.22–5.81)
RDW: 12.9 % (ref 11.5–15.5)
WBC: 6.1 10*3/uL (ref 4.0–10.5)
nRBC: 0 % (ref 0.0–0.2)

## 2020-05-14 LAB — GLUCOSE, CAPILLARY: Glucose-Capillary: 204 mg/dL — ABNORMAL HIGH (ref 70–99)

## 2020-05-14 MED ORDER — WARFARIN SODIUM 5 MG PO TABS: 5.0000 mg | ORAL_TABLET | Freq: Every day | ORAL | 2 refills | Status: DC

## 2020-05-14 MED ORDER — AMIODARONE HCL 200 MG PO TABS: 200.0000 mg | ORAL_TABLET | Freq: Two times a day (BID) | ORAL | 1 refills | Status: DC

## 2020-05-14 MED ORDER — OXYCODONE-ACETAMINOPHEN 5-325 MG PO TABS: 1.0000 | ORAL_TABLET | ORAL | 0 refills | Status: AC | PRN

## 2020-05-14 MED ORDER — ASPIRIN 81 MG PO TBEC: 81.0000 mg | DELAYED_RELEASE_TABLET | Freq: Every day | ORAL | 11 refills | Status: DC

## 2020-05-14 MED FILL — Sodium Bicarbonate IV Soln 8.4%: INTRAVENOUS | Qty: 50 | Status: AC

## 2020-05-14 MED FILL — Electrolyte-R (PH 7.4) Solution: INTRAVENOUS | Qty: 3000 | Status: AC

## 2020-05-14 MED FILL — Sodium Chloride IV Soln 0.9%: INTRAVENOUS | Qty: 3000 | Status: AC

## 2020-05-14 MED FILL — Heparin Sodium (Porcine) Inj 1000 Unit/ML: INTRAMUSCULAR | Qty: 20 | Status: AC

## 2020-05-14 NOTE — Progress Notes (Signed)
Discharge instructions provided to patient. Medications, follow-up appointments and incisional care reviewed. All questions answered. IVs removed. Patient to be escorted home by his wife.   Gailen Shelter RN

## 2020-05-14 NOTE — Progress Notes (Addendum)
[facility] DAILY ICU PROGRESS NOTE                   [address]            [place] [address]          [phone]   5 Days Post-Op Procedure(s) (LRB): MINIMALLY INVASIVE MITRAL VALVE REPAIR (MVR) USING LIVANOVA MEMO 4D 28MM MITRAL RING (Right) TRANSESOPHAGEAL ECHOCARDIOGRAM (TEE) (N/A)  Total Length of Stay:  LOS: 5 days   Subjective:  Awake and alert, walking around his room. Good sats on RA.  No new concerns.      Objective: Vital signs in last 24 hours: Temp:  [98.1 F (36.7 C)-99.1 F (37.3 C)] 98.2 F (36.8 C) (02/14 0124) Pulse Rate:  [48-88] 88 (02/14 0717) Cardiac Rhythm: Normal sinus rhythm;Heart block;Junctional rhythm;Other (Comment) (02/13 1910) Resp:  [17-20] 17 (02/14 0717) BP: (122-135)/(68-80) 135/79 (02/14 0717) SpO2:  [94 %-100 %] 100 % (02/14 0717) Weight:  [85.5 kg] 85.5 kg (02/14 0714)  Filed Weights   05/12/20 0446 05/13/20 0404 05/14/20 0714  Weight: 88.5 kg 86 kg 85.5 kg    Weight change:    Hemodynamic parameters for last 24 hours:    Intake/Output from previous day: No intake/output data recorded.  Intake/Output this shift: No intake/output data recorded.  Current Meds: Scheduled Meds: . acetaminophen  1,000 mg Oral Q6H  . amiodarone  200 mg Oral BID PC  . aspirin EC  81 mg Oral Daily  . atorvastatin  10 mg Oral Daily  . bisacodyl  10 mg Oral Daily   Or  . bisacodyl  10 mg Rectal Daily  . Chlorhexidine Gluconate Cloth  6 each Topical Daily  . docusate sodium  200 mg Oral Daily  . enoxaparin (LOVENOX) injection  40 mg Subcutaneous QHS  . furosemide  40 mg Oral Daily  . insulin aspart  0-15 Units Subcutaneous TID WC  . lidocaine  1 patch Transdermal Q24H  . magnesium oxide  400 mg Oral Daily  . mouth rinse  15 mL Mouth Rinse BID  . metoprolol tartrate  12.5 mg Oral BID  . pantoprazole  40 mg Oral Daily  . potassium chloride  20 mEq Oral Daily  . sodium chloride flush  10-40 mL Intracatheter Q12H  . sodium  chloride flush  3 mL Intravenous Q12H  . warfarin  5 mg Oral q1600  . Warfarin - Physician Dosing Inpatient   Does not apply q1600   Continuous Infusions: . sodium chloride 250 mL (05/10/20 0654)  . lactated ringers Stopped (05/09/20 1530)   PRN Meds:.albuterol, calcium carbonate, dextrose, metoprolol tartrate, morphine injection, ondansetron (ZOFRAN) IV, oxyCODONE, polyethylene glycol, sodium chloride flush, sodium chloride flush, traMADol  General appearance: alert, no distress Neurologic: intact Heart: Had some a-fib yesterday, oral amiodarone load started. Now in regular rhythm but I do not see p-waves.  Lungs: Breath sounds are clear, shallow. Abdomen: soft, NT.  Extremities: Well perfused.  Lab Results: CBC: Recent Labs    05/12/20 0534 05/14/20 0106  WBC 7.8 6.1  HGB 12.6* 11.2*  HCT 36.6* 33.1*  PLT 99* 119*   BMET:  Recent Labs    05/12/20 0534  NA 135  K 3.8  CL 103  CO2 19*  GLUCOSE 174*  BUN 13  CREATININE 1.06  CALCIUM 8.7*    CMET: Lab Results  Component Value Date   WBC 5.8 05/10/2020   HGB 9.5 (L) 05/10/2020   HCT 28.0 (L)  05/10/2020   PLT 75 (L) 05/10/2020   GLUCOSE 112 (H) 05/10/2020   CHOL 116 03/05/2014   TRIG 122 03/05/2014   HDL 21 (L) 03/05/2014   LDLCALC 71 03/05/2014   ALT 29 05/07/2020   AST 30 05/07/2020   NA 138 05/10/2020   K 4.6 05/10/2020   CL 109 05/10/2020   CREATININE 0.84 05/10/2020   BUN 14 05/10/2020   CO2 19 (L) 05/10/2020   TSH 0.549 Test methodology is 3rd generation TSH 01/02/2009   INR 1.3 (H) 05/09/2020   HGBA1C 8.4 (H) 05/07/2020      PT/INR:  Recent Labs    05/14/20 0106  LABPROT 15.9*  INR 1.3*   Radiology: No results found.   Assessment/Plan: S/P Procedure(s) (LRB): MINIMALLY INVASIVE MITRAL VALVE REPAIR (MVR) USING LIVANOVA MEMO 4D 28MM MITRAL RING (Right) TRANSESOPHAGEAL ECHOCARDIOGRAM (TEE) (N/A)  -POD5 minimally invasive mitral valve repair for severe MR. Stable VS and hemodynamics  since surgery.   -Post-op atrial fibrillation- oral amiodarone load started, now appears to be in [place] with rate 70's-80's. In the 50's during the night.    -Expected acute blood loss anemia- Hct 33%.  -History of type 2 DM- HgbA1C 8.4 pre-op.Glucose 200-280 past 24 hours. Continue SSI.  Resume his metformin, Jardiance, Actos, and Prandin at discharge.   -DVT PPX-on enoxaparin daily  -Thrombocytopenia- resolving, Plt count 119K  -Disposition- Plan discharge home today if Dr. Roxy Manns agrees.  Antony Odea, PA-C [phone] 05/14/2020 7:49 AM    I have seen and examined the patient and agree with the assessment and plan as outlined.  D/C home today.  Instructions given.  [name], MD 05/14/2020 8:47 AM

## 2020-05-14 NOTE — Progress Notes (Signed)
CARDIAC REHAB PHASE I   Pt walking around room independently, steady on feet. Reinforced importance of site care and monitoring incision daily. Encouraged continued IS use and walks. Reviewed restrictions and exercise guidelines. Pt referred to CRP II. Pt denies further questions or concerns at this time. D/c today.  [phone] Rufina Falco, RN BSN 05/14/2020 8:59 AM

## 2020-05-16 ENCOUNTER — Telehealth: Payer: Self-pay | Admitting: *Deleted

## 2020-05-16 ENCOUNTER — Telehealth (HOSPITAL_COMMUNITY): Payer: Self-pay

## 2020-05-16 ENCOUNTER — Encounter: Payer: Self-pay | Admitting: Thoracic Surgery (Cardiothoracic Vascular Surgery)

## 2020-05-16 ENCOUNTER — Telehealth: Payer: Self-pay | Admitting: Thoracic Surgery (Cardiothoracic Vascular Surgery)

## 2020-05-16 DIAGNOSIS — E1165 Type 2 diabetes mellitus with hyperglycemia: Secondary | ICD-10-CM | POA: Diagnosis not present

## 2020-05-16 DIAGNOSIS — R319 Hematuria, unspecified: Secondary | ICD-10-CM | POA: Diagnosis not present

## 2020-05-16 DIAGNOSIS — Z952 Presence of prosthetic heart valve: Secondary | ICD-10-CM | POA: Diagnosis not present

## 2020-05-16 DIAGNOSIS — I480 Paroxysmal atrial fibrillation: Secondary | ICD-10-CM | POA: Diagnosis not present

## 2020-05-16 NOTE — Telephone Encounter (Signed)
I spoke with patient over the telephone today who had called our office earlier to report that over the last 2 days he has had a few episodes of tachypalpitations which using his apple watch were found to be consistent with likely atrial fibrillation.  For the most part he states his heart rate has been in the 50s to 70s but on a couple of occasions it shot up to 130 bpm.  He otherwise is feeling quite well.  He has no shortness of breath.  He has been walking a fair amount without any difficulty.  He and he has not had hardly any pain.  He has not required any oral narcotic pain relievers since hospital discharge.  I suggested that the patient increase his dose of amiodarone to 400 mg by mouth twice daily for 7 days and then cut back to 200 mg by mouth twice daily.  He will continue Toprol-XL at its current dose.  I offered to make arrangements for him to be seen in the atrial fibrillation clinic if symptoms continue or progress, but he states that he is comfortable at this point and overall doing very well.  All questions answered.  [name], MD 05/16/2020 4:35 PM

## 2020-05-16 NOTE — Telephone Encounter (Signed)
[name] contacted the office stating at times he becomes dizzy upon standing as well as experiencing blood in his urine. Per pt, he is on jardiance and in the past he has developed UTI's while on this medication. Pt states his urine has grown cloudy over the past day and this morning he noticed blood in his urine. Pt states he is going to contact his PCP for potential UTI. Pt also states he has experienced dizziness upon standing. Advised pt to stand slowly, maintain hydration, and continue monitoring HR and BP. HR maintaining between 59-70 but at times jumping up to 130. Pt states he is able to monitor HR and rhythm via Apple watch. Pt states he is going to contact his cardiologist as well. Pt states incisions are healing well without signs or symptoms of infection noted. Advised pt to call back if further issues arise. No further questions at this time.

## 2020-05-16 NOTE — Telephone Encounter (Signed)
Pt insurance is active and benefits verified through [employer]. Co-pay $0.00, DED $400.00/$25.48 met, out of pocket $2,500.00/$305.48 met, co-insurance 15%. No pre-authorization required. Passport, 05/16/20 @ 3:23PM, BZM#[id number]  Will contact patient to see if he is interested in the Cardiac Rehab Program. If interested, patient will need to complete follow up appt. Once completed, patient will be contacted for scheduling upon review by the RN Navigator.

## 2020-05-16 NOTE — Telephone Encounter (Signed)
Attempted to call patient in regards to Cardiac Rehab - LM on VM 

## 2020-05-17 ENCOUNTER — Other Ambulatory Visit: Payer: Self-pay

## 2020-05-17 ENCOUNTER — Ambulatory Visit (INDEPENDENT_AMBULATORY_CARE_PROVIDER_SITE_OTHER): Payer: BC Managed Care – PPO | Admitting: *Deleted

## 2020-05-17 DIAGNOSIS — J45909 Unspecified asthma, uncomplicated: Secondary | ICD-10-CM | POA: Diagnosis not present

## 2020-05-17 DIAGNOSIS — I480 Paroxysmal atrial fibrillation: Secondary | ICD-10-CM | POA: Diagnosis not present

## 2020-05-17 DIAGNOSIS — I10 Essential (primary) hypertension: Secondary | ICD-10-CM | POA: Diagnosis not present

## 2020-05-17 DIAGNOSIS — Z9889 Other specified postprocedural states: Secondary | ICD-10-CM | POA: Diagnosis not present

## 2020-05-17 DIAGNOSIS — Z5181 Encounter for therapeutic drug level monitoring: Secondary | ICD-10-CM | POA: Diagnosis not present

## 2020-05-17 DIAGNOSIS — E1169 Type 2 diabetes mellitus with other specified complication: Secondary | ICD-10-CM | POA: Diagnosis not present

## 2020-05-17 LAB — POCT INR: INR: 1.7 — AB (ref 2.0–3.0)

## 2020-05-17 NOTE — Patient Instructions (Addendum)
Description   Start taking 1 tablet daily except for 1/2 a tablet on Monday, Wednesday and Friday. Recheck INR in 1 week. Call coumadin clinic for any changes in medications or upcoming procedures. [phone].   On amio 400mg  BIDx 1 week then will go to Amio 200 mg bid per phone note from Dr. Roxy Manns on 2/16.   A full discussion of the nature of anticoagulants has been carried out.  A benefit risk analysis has been presented to the patient, so that they understand the justification for choosing anticoagulation at this time. The need for frequent and regular monitoring, precise dosage adjustment and compliance is stressed.  Side effects of potential bleeding are discussed.  The patient should avoid any OTC items containing aspirin or ibuprofen, and should avoid great swings in general diet.  Avoid alcohol consumption.  Call if any signs of abnormal bleeding.

## 2020-05-21 ENCOUNTER — Telehealth: Payer: Self-pay | Admitting: *Deleted

## 2020-05-21 ENCOUNTER — Other Ambulatory Visit: Payer: Self-pay

## 2020-05-21 ENCOUNTER — Ambulatory Visit (INDEPENDENT_AMBULATORY_CARE_PROVIDER_SITE_OTHER): Payer: Self-pay | Admitting: *Deleted

## 2020-05-21 DIAGNOSIS — Z9889 Other specified postprocedural states: Secondary | ICD-10-CM

## 2020-05-21 DIAGNOSIS — Z4802 Encounter for removal of sutures: Secondary | ICD-10-CM

## 2020-05-21 NOTE — Progress Notes (Signed)
Patient arrived for nurse visit to remove sutures post-Mini MVR 05/10/19 by Dr. Roxy Manns.  Two sutures removed with no signs or symptoms of infection noted.  Incisions well approximated.  Patient tolerated suture removal well.  Patient and family instructed to keep the incision site clean and dry. Patient and family acknowledged instructions given.  All questions answered.

## 2020-05-21 NOTE — Telephone Encounter (Signed)
Patient asked when he could resume driving s/p Mini MVR 05/09/20 by Dr. Roxy Manns. Pt states he last took Oxycodone for pain on Saturday. Per Dr. Roxy Manns, If he is otherwise feeling well with minimal pain and no more oxycodone in 1 week he may resume driving. Pt aware, no further questions at this time.

## 2020-05-24 ENCOUNTER — Other Ambulatory Visit: Payer: Self-pay

## 2020-05-24 ENCOUNTER — Ambulatory Visit (INDEPENDENT_AMBULATORY_CARE_PROVIDER_SITE_OTHER): Payer: BC Managed Care – PPO | Admitting: *Deleted

## 2020-05-24 DIAGNOSIS — Z5181 Encounter for therapeutic drug level monitoring: Secondary | ICD-10-CM

## 2020-05-24 DIAGNOSIS — Z9889 Other specified postprocedural states: Secondary | ICD-10-CM

## 2020-05-24 LAB — POCT INR: INR: 1.7 — AB (ref 2.0–3.0)

## 2020-05-24 NOTE — Patient Instructions (Signed)
Description    Take 1.5 tablets today and then continue to take 1 tablet daily except for 1/2 a tablet on Mondays and Fridays. Recheck INR in 1 week. Call coumadin clinic for any changes in medications or upcoming procedures. [phone].

## 2020-05-28 NOTE — Progress Notes (Signed)
Cardiology Clinic Note   Patient Name: BHAVIN MONJARAZ Date of Encounter: 05/29/2020  Primary Care Provider:  Orpah Melter, MD Primary Cardiologist:  Larae Grooms, MD  Patient Profile    Fabiola Backer 55 year old male presents the clinic today for an evaluation of his atrial fibrillation.  Past Medical History    Past Medical History:  Diagnosis Date  . Allergic rhinitis   . Asthma    animal dander & seasonal asthma  . Colon cancer ([place]) 01/26/2019  . Diabetes mellitus without complication ([place])    dx 2010  type 2  . Discitis of lumbosacral region    first started with this ..and rolled onto the endocarditis    stayed a month  . 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 Family history of colon cancer   . Family history of thyroid cancer   . H/O scoliosis   . 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 History of hiatal hernia   . Hyperlipidemia   . Hypertension    Diagnostic exercise tolerance test assessment:04/30/2010 : comments normal -no evidence os ischemia by ST analysis  . lipoma   . Lipoma 01/26/2019  . Mitral regurgitation    Echo 12/2018: EF 60-65, myxomatous mitral valve, severe mitral regurgitation, partial flail leaflet of posterior mitral valve, myxomatous tricuspid valve with trivial TR, ascending aorta mildly dilated (39 mm)  . Pneumonia    as child  . PONV (postoperative nausea and vomiting)   . 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   Complex valvuloplasty including artificial Gore-tex neochord placement x6 with 28 mm Sorin Memo 4D ring annuloplasty via right mini thoracotomy approach  . Solitary kidney   . 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 ABDOMINAL AORTOGRAM N/A 02/10/2020   Procedure: ABDOMINAL AORTOGRAM;  Surgeon: Jettie Booze, MD;  Location: [facility];  Service: Cardiovascular;  Laterality: N/A;   . BIOPSY  01/07/2019   Procedure: BIOPSY;  Surgeon: Wilford Corner, MD;  Location: [facility];  Service: Endoscopy;;  . BIOPSY  01/03/2020   Procedure: BIOPSY;  Surgeon: Wilford Corner, MD;  Location: [facility];  Service: Endoscopy;;  . COLON SURGERY  03/04/2019   left colon segmental resection  . COLONOSCOPY WITH PROPOFOL N/A 01/07/2019   Procedure: COLONOSCOPY WITH PROPOFOL;  Surgeon: Wilford Corner, MD;  [facility]: [facility];  Service: Endoscopy;  Laterality: N/A;  . COLONOSCOPY WITH PROPOFOL N/A 01/03/2020   Procedure: COLONOSCOPY WITH PROPOFOL;  Surgeon: Wilford Corner, MD;  [facility];  Service: Endoscopy;  Laterality: N/A;  . INGUINAL HERNIA REPAIR Left 04/23/2016   Procedure: LAPAROSCOPIC  INGUINAL REPAIR;  Surgeon: Clovis Riley, MD;  Location: [facility];  Service: General;  Laterality: Left;  . KNEE SURGERY     arthroscopic left knee  . LIPOMA EXCISION  2015   x20 [facility]  . MITRAL VALVE REPAIR Right 05/09/2020   Procedure: MINIMALLY INVASIVE MITRAL VALVE REPAIR (MVR) USING LIVANOVA MEMO 4D 28MM MITRAL RING;  Surgeon: [name], MD;  Location: [facility];  Service: Open Heart Surgery;  Laterality: Right;  . POLYPECTOMY  01/03/2020   Procedure: POLYPECTOMY;  Surgeon: Wilford Corner, MD;  [facility];  Service: Endoscopy;;  . PORT-A-CATH REMOVAL    . PORTACATH PLACEMENT N/A 03/16/2019   Procedure: INSERTION PORT-A-CATH;  Surgeon: Michael Boston, MD;  Location: [facility];  Service: General;  Laterality: N/A;  . RIGHT/LEFT HEART CATH AND CORONARY ANGIOGRAPHY N/A 02/10/2020   Procedure: RIGHT/LEFT HEART CATH AND CORONARY ANGIOGRAPHY;  Surgeon: Jettie Booze, MD;  Location: [facility];  Service: Cardiovascular;  Laterality: N/A;  . SUBMUCOSAL INJECTION  01/07/2019   Procedure: SUBMUCOSAL INJECTION;  Surgeon: Wilford Corner, MD;  [facility];  Service: Endoscopy;;  . TEE WITHOUT CARDIOVERSION N/A 02/10/2020   Procedure:  TRANSESOPHAGEAL ECHOCARDIOGRAM (TEE);  Surgeon: Acie Fredrickson Wonda Cheng, MD;  Location: [facility];  Service: Cardiovascular;  Laterality: N/A;  CATH AFTER TEE  . TEE WITHOUT CARDIOVERSION N/A 05/09/2020   Procedure: TRANSESOPHAGEAL ECHOCARDIOGRAM (TEE);  Surgeon: [name], MD;  Location: [facility];  Service: Open Heart Surgery;  Laterality: N/A;  . THYROIDECTOMY, PARTIAL  01/05/2009   Right Thyroid Lobectomy  . TONSILLECTOMY    . TOTAL NEPHRECTOMY Left 1970   Wilms Tumor left kidney excised as an infant    Allergies  No Known Allergies  History of Present Illness    Mr. Levario is a PMH of coronary artery disease, Wilms tumor status post nephrectomy, hypertension, lipoma, mitral bacterial valve endocarditis 8315 which was complicated by mitral valve regurgitation, had cerebral emboli from endocarditis which was followed by a 6-week course of antibiotics to treat his Enterococcus bloodstream infection.  PMH also includes HTN, hyperlipidemia stage IIIc adenocarcinoma of the colon status post left colectomy 12/20 status post chemotherapy, and type 2 diabetes.  TEE 02/10/2020 showed severe mitral valve regurgitation with flail segment involving portions of the posterior leaflet.  EF 60-65%.  Diagnostic cardiac cath showed normal coronary arteries with no significant coronary artery disease.  PA pressures were normal.  He presented for surgery and was taken to the operating room 05/09/2020.  He received minimally invasive mitral valve repair.  He was extubated in the operating room.  He was transferred to the intensive care unit in stable condition.  He remained hemodynamically stable.  He was noted to have a slow junctional rhythm in the 40s and low 50s initially.  He was atrially paced.  Anticoagulation with Coumadin was initiated postop day 1.  He was noted to have a rhythm change that was atrial fibrillation with RVR versus accelerated junctional 05/12/20.  He was placed on Lopressor.  He was discharged in  stable condition on 05/14/2020.  He contacted Dr. Roxy Manns on 05/16/2020.  He reported that he had been monitoring his heart rate using his apple watch.  He noted occasional increases in his heart rate up to the 130s.  He was instructed to increase his amiodarone to 400 mg twice daily for 7 days and then cut back to 200 mg twice daily.  His metoprolol succinate was continued at the current dose.  He was offered a referral to the atrial fibrillation clinic however, he felt he was doing well and wished to defer referral at that time.  He presents the clinic today for follow-up evaluation states he is increasing his physical activity slowly.  He reports that he walks 3-5 times per day for 15 minutes at a time.  He has been increasing his walking distance by about 100 yards each day.  He reports that he contacted cardiothoracic surgery office and his amiodarone once increased.  He has not had any further episodes of increased heart rate or irregular heart rate since February 16.  He does have some surgical site tenderness.  He reports that he is following his standard diet and trying to be as compliant as he  can with his diet related to warfarin.  He continues to work with Coumadin clinic.  I will give him salty 6 diet information, have him continue to increase his physical activity as tolerated, follow-up with cardiothoracic surgery as scheduled and Dr. Irish Lack in 3 months.  Today denies chest pain, shortness of breath, lower extremity edema, fatigue, palpitations, melena, hematuria, hemoptysis, diaphoresis, weakness, presyncope, syncope, orthopnea, and PND.  Home Medications    Prior to Admission medications   Medication Sig Start Date End Date Taking? Authorizing Provider  albuterol (PROVENTIL HFA;VENTOLIN HFA) 108 (90 BASE) MCG/ACT inhaler Inhale 1 puff into the lungs every 6 (six) hours as needed for wheezing or shortness of breath.    [provider]  amiodarone (PACERONE) 200 MG tablet Take 1  tablet (200 mg total) by mouth 2 (two) times daily after a meal. 05/14/20   Roddenberry, Arlis Porta, PA-C  aspirin EC 81 MG EC tablet Take 1 tablet (81 mg total) by mouth daily. Swallow whole. 05/15/20   Antony Odea, PA-C  atorvastatin (LIPITOR) 10 MG tablet Take 1 tablet (10 mg total) by mouth daily. 03/15/14   Angiulli, Lavon Paganini, PA-C  calcium carbonate (TUMS - DOSED IN MG ELEMENTAL CALCIUM) 500 MG chewable tablet Chew 500 mg by mouth daily as needed for indigestion or heartburn.     [provider]  fluticasone (FLONASE) 50 MCG/ACT nasal spray Place 1 spray into both nostrils daily as needed for allergies or rhinitis.    [provider]  JARDIANCE 10 MG TABS tablet Take 10 mg by mouth daily. 04/19/20   [provider]  lisinopril (PRINIVIL,ZESTRIL) 2.5 MG tablet Take 1 tablet (2.5 mg total) by mouth daily. 03/15/14   Angiulli, Lavon Paganini, PA-C  loratadine (CLARITIN) 10 MG tablet Take 10 mg by mouth daily as needed for allergies.    [provider]  metFORMIN (GLUCOPHAGE) 1000 MG tablet Take 1 tablet (1,000 mg total) by mouth 2 (two) times daily. 02/12/20   Jettie Booze, MD  mometasone [facility]) 220 MCG/INH inhaler Inhale 1 puff into the lungs daily.    [provider]  Omega-3 Fatty Acids (FISH OIL) 1000 MG CAPS Take 1,000 mg by mouth daily.     [provider]  ONE TOUCH ULTRA TEST test strip 1 each by Other route as needed (GLUCOSE).  05/06/15   [provider]  pioglitazone (ACTOS) 15 MG tablet Take 15 mg by mouth daily. 11/17/19   [provider]  repaglinide (PRANDIN) 1 MG tablet Take 1 mg by mouth 2 (two) times daily before a meal.     [provider]  TOPROL XL 25 MG 24 hr tablet Take 25 mg by mouth daily.  07/07/14   [provider]  warfarin (COUMADIN) 5 MG tablet Take 1 tablet (5 mg total) by mouth daily at 4 PM. Or as directed by the coumadin Clinic 05/14/20   Antony Odea, PA-C     Family History    Family History  Problem Relation Age of Onset  . Heart disease Father   . Hernia Father   . Healthy Sister   . Stroke Paternal Grandmother   . Cancer Mother        THYROID  . Hypotension Mother   . Heart attack Maternal Grandfather   . Congestive Heart Failure Maternal Grandfather   . Heart attack Paternal Grandfather   . Colon cancer Maternal Aunt 79  . Congestive Heart Failure Paternal Uncle   .  Congestive Heart Failure Maternal Grandmother   . Colon cancer Other 55       MGMs brother  . Colon cancer Other        MGFs brother   He indicated that his mother is alive. He indicated that his father is alive. He indicated that his sister is alive. He indicated that his maternal grandmother is deceased. He indicated that his maternal grandfather is deceased. He indicated that his paternal grandmother is deceased. He indicated that his paternal grandfather is deceased. He indicated that both of his maternal aunts are alive. He indicated that his maternal uncle is alive. He indicated that his paternal aunt is alive. He indicated that only one of his two paternal uncles is alive. He indicated that both of his others are deceased.  Social History    Social History   Socioeconomic History  . Marital status: Married    Spouse name: Not on file  . Number of children: Not on file  . Years of education: Not on file  . Highest education level: Not on file  Occupational History  . Occupation: executive  Tobacco Use  . Smoking status: Never Smoker  . Smokeless tobacco: Never Used  Vaping Use  . Vaping Use: Never used  Substance and Sexual Activity  . Alcohol use: Yes    Alcohol/week: 1.0 standard drink    Types: 1 Glasses of wine per week    Comment: occasional  . Drug use: No  . Sexual activity: Yes  Other Topics Concern  . Not on file  Social History Narrative   Never smoked    Alcohol yes, rare ,1-2 per week   No recreational drugs   Occupation-   Geneticist, molecular and other executive courses   Marital status - married     Children 1 boy         Social Determinants of Radio broadcast assistant Strain: Not on file  Food Insecurity: Not on file  Transportation Needs: Not on file  Physical Activity: Not on file  Stress: Not on file  Social Connections: Not on file  Intimate Partner Violence: Not on file     Review of Systems    General:  No chills, fever, night sweats or weight changes.  Cardiovascular:  No chest pain, dyspnea on exertion, edema, orthopnea, palpitations, paroxysmal nocturnal dyspnea. Dermatological: No rash, lesions/masses Respiratory: No cough, dyspnea Urologic: No hematuria, dysuria Abdominal:   No nausea, vomiting, diarrhea, bright red blood per rectum, melena, or hematemesis Neurologic:  No visual changes, wkns, changes in mental status. All other systems reviewed and are otherwise negative except as noted above.  Physical Exam    VS:  BP (!) 132/54 (BP Location: Left Arm, Patient Position: Sitting, Cuff Size: Normal)   Pulse 75   Ht 6' (1.829 m)   Wt 188 lb 6.4 oz (85.5 kg)   BMI 25.55 kg/m  , BMI Body mass index is 25.55 kg/m. GEN: Well nourished, well developed, in no acute distress. HEENT: normal. Neck: Supple, no JVD, carotid bruits, or masses. Cardiac: RRR, no murmurs, rubs, or gallops. No clubbing, cyanosis, edema.  Radials/DP/PT 2+ and equal bilaterally.  Respiratory:  Respirations regular and unlabored, clear to auscultation bilaterally. GI: Soft, nontender, nondistended, BS + x 4. MS: no deformity or atrophy. Skin: warm and dry, no rash. Neuro:  Strength and sensation are intact. Psych: Normal affect.  Accessory Clinical Findings    Recent Labs: 05/07/2020: ALT 29 05/12/2020:  BUN 13; Creatinine, Ser 1.06; Magnesium 1.5; Potassium 3.8; Sodium 135 05/14/2020: Hemoglobin 11.2; Platelets 119   Recent Lipid Panel    Component Value Date/Time   CHOL 116 03/05/2014 0630    TRIG 122 03/05/2014 0630   HDL 21 (L) 03/05/2014 0630   CHOLHDL 5.5 03/05/2014 0630   VLDL 24 03/05/2014 0630   LDLCALC 71 03/05/2014 0630    ECG personally reviewed by me today-sinus rhythm with first-degree AV block possible left atrial enlargement rightward axis deviation 71 bpm- No acute changes  Echocardiogram 02/10/2020 IMPRESSIONS    1. Left ventricular ejection fraction, by estimation, is 60 to 65%. The  left ventricle has normal function.  2. Right ventricular systolic function is normal. The right ventricular  size is normal.  3. No left atrial/left atrial appendage thrombus was detected.  4. Moderate flail of posterior leaflet.  5. The MR jet is very eccentric and the degree if MR is difficult to  quatify. . The mitral valve is abnormal. Moderate to severe mitral valve  regurgitation.  6. The aortic valve is normal in structure. Aortic valve regurgitation is  not visualized. No aortic stenosis is present.   Assessment & Plan   1.  Paroxysmal atrial fibrillation-EKG today shows sinus rhythm with first-degree AV block possible left atrial enlargement rightward axis deviation 71 bpm.  Has not noticed any further episodes of increased heart rate or irregular heartbeats since contacting Dr. Roxy Manns on 05/16/2020.  Reports compliance with warfarin and no bleeding issues. Continue amiodarone, metoprolol, warfarin Heart healthy low-sodium diet-salty 6 given Increase physical activity slowly Avoid triggers caffeine, chocolate, EtOH, dehydration etc.  Severe mitral valve regurgitation-status post minimally invasive mitral valve repair 05/09/2020-05/14/2020.  Reports he feels well and continues to increase his physical activity slowly.  No increased DOE. Continue amiodarone, metoprolol, warfarin Heart healthy low-sodium diet-salty 6 given Follows with cardiothoracic surgery  Essential hypertension-BP today 132/54.  Well-controlled at home. Continue lisinopril,  metoprolol Heart healthy low-sodium diet-salty 6 given  Hyperlipidemia-LDL 70 on 2/21 Continue atorvastatin Heart healthy low-sodium high-fiber diet Increase physical activity as tolerated  Disposition: Follow-up with Dr. Irish Lack in 3 months and Dr. Roxy Manns as scheduled.   Jossie Ng. Kemya Shed NP-C    05/29/2020, 10:26 AM [place] [address] Office [phone] Fax [phone]  Notice: This dictation was prepared with Dragon dictation along with smaller phrase technology. Any transcriptional errors that result from this process are unintentional and may not be corrected upon review.  I spent 13 minutes examining this patient, reviewing medications, and using patient centered shared decision making involving her cardiac care.  Prior to her visit I spent greater than 20 minutes reviewing her past medical history,  medications, and prior cardiac tests.

## 2020-05-29 ENCOUNTER — Other Ambulatory Visit: Payer: Self-pay

## 2020-05-29 ENCOUNTER — Encounter: Payer: Self-pay | Admitting: General Practice

## 2020-05-29 ENCOUNTER — Ambulatory Visit: Payer: BC Managed Care – PPO | Admitting: General Practice

## 2020-05-29 VITALS — BP 132/54 | HR 75 | Ht 72.0 in | Wt 188.4 lb

## 2020-05-29 DIAGNOSIS — Z9889 Other specified postprocedural states: Secondary | ICD-10-CM | POA: Diagnosis not present

## 2020-05-29 DIAGNOSIS — I10 Essential (primary) hypertension: Secondary | ICD-10-CM

## 2020-05-29 DIAGNOSIS — E782 Mixed hyperlipidemia: Secondary | ICD-10-CM

## 2020-05-29 DIAGNOSIS — I480 Paroxysmal atrial fibrillation: Secondary | ICD-10-CM

## 2020-05-29 NOTE — Patient Instructions (Signed)
Medication Instructions:  The current medical regimen is effective;  continue present plan and medications as directed. Please refer to the Current Medication list given to you today.  *If you need a refill on your cardiac medications before your next appointment, please call your pharmacy*  Lab Work:   Testing/Procedures:  NONE    NONE  Special Instructions PLEASE READ AND FOLLOW SALTY 6-ATTACHED-1,800mg  daily  PLEASE INCREASE PHYSICAL ACTIVITY AS TOLERATED  Follow-Up: Your next appointment:  3 month(s) In Person with Casandra Doffing, MD OR IF UNAVAILABLE [place], FNP-C   At [facility], you and your health needs are our priority.  As part of our continuing mission to provide you with exceptional heart care, we have created designated Provider Care Teams.  These Care Teams include your primary Cardiologist (physician) and Advanced Practice Providers (APPs -  Physician Assistants and Nurse Practitioners) who all work together to provide you with the care you need, when you need it.  We recommend signing up for the patient portal called "MyChart".  Sign up information is provided on this After Visit Summary.  MyChart is used to connect with patients for Virtual Visits (Telemedicine).  Patients are able to view lab/test results, encounter notes, upcoming appointments, etc.  Non-urgent messages can be sent to your provider as well.   To learn more about what you can do with MyChart, go to [email or web address].

## 2020-05-31 ENCOUNTER — Ambulatory Visit (INDEPENDENT_AMBULATORY_CARE_PROVIDER_SITE_OTHER): Payer: BC Managed Care – PPO | Admitting: *Deleted

## 2020-05-31 ENCOUNTER — Other Ambulatory Visit: Payer: Self-pay

## 2020-05-31 DIAGNOSIS — Z9889 Other specified postprocedural states: Secondary | ICD-10-CM

## 2020-05-31 DIAGNOSIS — Z5181 Encounter for therapeutic drug level monitoring: Secondary | ICD-10-CM

## 2020-05-31 LAB — POCT INR: INR: 1.7 — AB (ref 2.0–3.0)

## 2020-05-31 NOTE — Patient Instructions (Signed)
Description   Take 1.5 tablets today and tomorrow take 1 tablet then start taking 1 tablet daily except for 1/2 tablet on Fridays. Recheck INR in 1 week. Call coumadin clinic for any changes in medications or upcoming procedures. [phone].

## 2020-06-01 ENCOUNTER — Telehealth: Payer: Self-pay | Admitting: Interventional Cardiology

## 2020-06-01 NOTE — Telephone Encounter (Signed)
Patient notified.  He will call us on Monday if dizziness does not improve so change in amiodarone can be made

## 2020-06-01 NOTE — Telephone Encounter (Signed)
See phone note from today.

## 2020-06-01 NOTE — Telephone Encounter (Signed)
STAT if patient feels like he/she is going to faint   1) Are you dizzy now? yes  2) Do you feel faint or have you passed out? no  3) Do you have any other symptoms? flushing  4) Have you checked your HR and BP (record if available)? HR 60's and low 70's , BP has been normal 120-140/50-70's    Patient states he started having dizziness early yesterday morning. He states he has to stop what he is doing and close his eyes when it happens. He states he also gets flushing, and starts getting warmer and sweating. He states he does not feel faint, but he does not feel comfortable standing up walking across the room.

## 2020-06-01 NOTE — Telephone Encounter (Signed)
Stop lisinopril 2.5 mg daily.  If dizzinesss does not resolve by Monday, will decrease amiodarone to 200 mg daily.   JV

## 2020-06-01 NOTE — Telephone Encounter (Signed)
I spoke with patient.  He reports a couple of episodes of dizziness last week.  Discussed with Coletta Memos, NP at office visit on 05/29/20.  Patient reports everything was OK at this visit.  Patient reports yesterday after getting out of the shower he felt dizzy.  Water was not warmer than usual.  Since yesterday he has had more frequent episodes of dizziness. Not related to position changes.  Occurs while walking, sitting in a chair or lying in bed. Last night he was outside and had to sit down due to dizziness.  He is staying hydrated.  Blood sugar has been elevated more than goal but is less than it has been.  He has checked blood sugar during dizziness episodes yesterday and today and it has not been low.  Blood sugar running 160-200. Dizziness is not related to turning his head.  Heart rate and BP have been good.  Not feeling any palpitations. I advised patient to change positions slowly and maintain hydration. Will forward to Dr Irish Lack for review/recommendations

## 2020-06-07 ENCOUNTER — Ambulatory Visit (INDEPENDENT_AMBULATORY_CARE_PROVIDER_SITE_OTHER): Payer: BC Managed Care – PPO | Admitting: *Deleted

## 2020-06-07 ENCOUNTER — Other Ambulatory Visit: Payer: Self-pay

## 2020-06-07 DIAGNOSIS — Z5181 Encounter for therapeutic drug level monitoring: Secondary | ICD-10-CM

## 2020-06-07 DIAGNOSIS — Z9889 Other specified postprocedural states: Secondary | ICD-10-CM | POA: Diagnosis not present

## 2020-06-07 LAB — POCT INR: INR: 1.5 — AB (ref 2.0–3.0)

## 2020-06-07 NOTE — Patient Instructions (Signed)
Description   Take 1.5 tablets today and tomorrow take 1.5 tablets then start taking 1 tablet daily 1.5 tablets on Mondays and Fridays. Recheck INR in 1 week. Call coumadin clinic for any changes in medications or upcoming procedures. [phone].

## 2020-06-08 ENCOUNTER — Ambulatory Visit (HOSPITAL_COMMUNITY)
Admission: RE | Admit: 2020-06-08 | Discharge: 2020-06-08 | Disposition: A | Discharge status: Home / Self Care | Payer: BC Managed Care – PPO | Source: Ambulatory Visit | Attending: Nurse Practitioner | Admitting: Nurse Practitioner

## 2020-06-08 ENCOUNTER — Other Ambulatory Visit: Payer: Self-pay | Admitting: Thoracic Surgery (Cardiothoracic Vascular Surgery)

## 2020-06-08 ENCOUNTER — Encounter (HOSPITAL_COMMUNITY): Payer: Self-pay | Admitting: Nurse Practitioner

## 2020-06-08 VITALS — BP 142/84 | HR 110 | Ht 72.0 in | Wt 191.0 lb

## 2020-06-08 DIAGNOSIS — I2510 Atherosclerotic heart disease of native coronary artery without angina pectoris: Secondary | ICD-10-CM | POA: Diagnosis not present

## 2020-06-08 DIAGNOSIS — Z79899 Other long term (current) drug therapy: Secondary | ICD-10-CM | POA: Insufficient documentation

## 2020-06-08 DIAGNOSIS — I483 Typical atrial flutter: Secondary | ICD-10-CM

## 2020-06-08 DIAGNOSIS — E785 Hyperlipidemia, unspecified: Secondary | ICD-10-CM | POA: Insufficient documentation

## 2020-06-08 DIAGNOSIS — I4891 Unspecified atrial fibrillation: Secondary | ICD-10-CM | POA: Diagnosis not present

## 2020-06-08 DIAGNOSIS — D6869 Other thrombophilia: Secondary | ICD-10-CM | POA: Diagnosis not present

## 2020-06-08 DIAGNOSIS — Z8249 Family history of ischemic heart disease and other diseases of the circulatory system: Secondary | ICD-10-CM | POA: Insufficient documentation

## 2020-06-08 DIAGNOSIS — Z7982 Long term (current) use of aspirin: Secondary | ICD-10-CM | POA: Insufficient documentation

## 2020-06-08 DIAGNOSIS — I340 Nonrheumatic mitral (valve) insufficiency: Secondary | ICD-10-CM | POA: Insufficient documentation

## 2020-06-08 DIAGNOSIS — I10 Essential (primary) hypertension: Secondary | ICD-10-CM | POA: Diagnosis not present

## 2020-06-08 DIAGNOSIS — Z7901 Long term (current) use of anticoagulants: Secondary | ICD-10-CM | POA: Insufficient documentation

## 2020-06-08 DIAGNOSIS — I4510 Unspecified right bundle-branch block: Secondary | ICD-10-CM | POA: Insufficient documentation

## 2020-06-08 DIAGNOSIS — Z9889 Other specified postprocedural states: Secondary | ICD-10-CM

## 2020-06-08 DIAGNOSIS — I4892 Unspecified atrial flutter: Secondary | ICD-10-CM | POA: Diagnosis not present

## 2020-06-08 MED ORDER — TOPROL XL 25 MG PO TB24: 25.0000 mg | ORAL_TABLET | Freq: Two times a day (BID) | ORAL | Status: DC

## 2020-06-08 NOTE — Patient Instructions (Signed)
Increase metoprolol to twice a day -- if you go back into normal rhythm you can reduce it back down to once a day.  Call Monday with update of heart rates [phone] Marzetta Board RN)

## 2020-06-08 NOTE — Telephone Encounter (Signed)
   Pt is calling back to follow up, he said his HR went up now since this morning. His resting pulse rate is now 110-120. He would like to speak with Dr. Hassell Done nurse

## 2020-06-08 NOTE — Progress Notes (Signed)
Primary Care Physician: Orpah Melter, MD Referring Physician: Theodoro Kos street triage  Cardiologist: Dr. Irish Lack CT surgeon: Dr. Jacqulyn Cane [name] is a 55 y.o. 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  PMH also includes HTN, hyperlipidemia stage IIIc adenocarcinoma of the colon status post left colectomy 12/20 status post chemotherapy, and type 2 diabetes.  TEE 02/10/2020 showed severe mitral valve regurgitation with flail segment involving portions of the posterior leaflet.  EF 60-65%.  Diagnostic cardiac cath showed normal coronary arteries with no significant coronary artery disease.  PA pressures were normal.  He presented for surgery and was taken to the operating room 05/09/2020.  He received minimally invasive mitral valve repair.  He was atrially paced.  Anticoagulation with Coumadin was initiated postop day 1.  He was noted to have a rhythm change that was atrial fibrillation with RVR versus accelerated junctional 05/12/20.  He was placed on Lopressor and amiodarone.  He was discharged in stable condition on 05/14/2020.  He is now in the afib clinic for onset of atrial; flutter noted per pt from his apple watch. Otherwise, he feels ok. He continues on amiodarone 200 mg bid and Toprol 25 mg qd. EKG shows typical  atrial flutter at 110 bpm, he reports that he left the hospital in [place] but had some breakthrough afib 2/16 and amiodarone was increased to 400 mg bid x one week. This is the first time he has noted afib since then. No obvious trigger.   Today, he denies symptoms of palpitations, chest pain, shortness of breath, orthopnea, PND, lower extremity edema, dizziness, presyncope, syncope, or  neurologic sequela. The patient is tolerating medications without difficulties and is otherwise without complaint today.   Past Medical History:  Diagnosis Date  . Allergic rhinitis   . Asthma    animal dander & seasonal asthma  . Colon cancer ([place]) 01/26/2019  . Diabetes mellitus without complication ([place])    dx 2010  type 2  . Discitis of lumbosacral region    first started with this ..and rolled onto the endocarditis    stayed a month  . 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 Family history of colon cancer   . Family history of thyroid cancer   . H/O scoliosis   . 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 History of hiatal hernia   . Hyperlipidemia   . Hypertension    Diagnostic exercise tolerance test assessment:04/30/2010 : comments normal -no evidence os ischemia by ST analysis  . lipoma   . Lipoma 01/26/2019  . Mitral regurgitation    Echo 12/2018: EF 60-65, myxomatous mitral valve, severe mitral regurgitation, partial flail leaflet of posterior mitral valve, myxomatous tricuspid valve with trivial TR, ascending aorta mildly dilated (39 mm)  . Pneumonia    as child  . PONV (postoperative nausea and vomiting)   . 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   Complex valvuloplasty including artificial Gore-tex neochord placement x6 with 28 mm Sorin Memo 4D ring annuloplasty via right mini thoracotomy approach  . Solitary kidney   . 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 ABDOMINAL AORTOGRAM  N/A 02/10/2020   Procedure: ABDOMINAL AORTOGRAM;  Surgeon: Jettie Booze, MD;  Location: [facility];  Service: Cardiovascular;  Laterality: N/A;  . BIOPSY  01/07/2019   Procedure: BIOPSY;  Surgeon: Wilford Corner, MD;  Location: [facility];  Service: Endoscopy;;  . BIOPSY  01/03/2020   Procedure: BIOPSY;  Surgeon: Wilford Corner, MD;  Location: [facility];  Service: Endoscopy;;  . COLON SURGERY  03/04/2019   left colon segmental resection  . COLONOSCOPY WITH PROPOFOL N/A 01/07/2019   Procedure: COLONOSCOPY WITH PROPOFOL;  Surgeon: Wilford Corner, MD;  Location: [facility];  Service: Endoscopy;  Laterality: N/A;  . COLONOSCOPY WITH PROPOFOL N/A 01/03/2020   Procedure: COLONOSCOPY WITH PROPOFOL;  Surgeon: Wilford Corner, MD;  Location: [facility];  Service: Endoscopy;  Laterality: N/A;  . INGUINAL HERNIA REPAIR Left 04/23/2016   Procedure: LAPAROSCOPIC  INGUINAL REPAIR;  Surgeon: Clovis Riley, MD;  Location: [facility];  Service: General;  Laterality: Left;  . KNEE SURGERY     arthroscopic left knee  . LIPOMA EXCISION  2015   x20 [facility]  . MITRAL VALVE REPAIR Right 05/09/2020   Procedure: MINIMALLY INVASIVE MITRAL VALVE REPAIR (MVR) USING LIVANOVA MEMO 4D 28MM MITRAL RING;  Surgeon: [name], MD;  Location: [facility];  Service: Open Heart Surgery;  Laterality: Right;  . POLYPECTOMY  01/03/2020   Procedure: POLYPECTOMY;  Surgeon: Wilford Corner, MD;  Location: [facility];  Service: Endoscopy;;  . PORT-A-CATH REMOVAL    . PORTACATH PLACEMENT N/A 03/16/2019   Procedure: INSERTION PORT-A-CATH;  Surgeon: Michael Boston, MD;  Location: [facility];  Service: General;  Laterality: N/A;  . RIGHT/LEFT HEART CATH AND CORONARY ANGIOGRAPHY N/A 02/10/2020   Procedure: RIGHT/LEFT HEART CATH AND CORONARY ANGIOGRAPHY;  Surgeon: Jettie Booze, MD;  Location: [facility];  Service: Cardiovascular;  Laterality: N/A;  . SUBMUCOSAL INJECTION  01/07/2019   Procedure: SUBMUCOSAL INJECTION;  Surgeon: Wilford Corner, MD;  [facility];  Service: Endoscopy;;  . TEE WITHOUT CARDIOVERSION N/A 02/10/2020   Procedure: TRANSESOPHAGEAL ECHOCARDIOGRAM (TEE);  Surgeon: Acie Fredrickson Wonda Cheng, MD;  Location: [facility];  Service: Cardiovascular;  Laterality: N/A;  CATH AFTER TEE  . TEE WITHOUT CARDIOVERSION N/A 05/09/2020   Procedure:  TRANSESOPHAGEAL ECHOCARDIOGRAM (TEE);  Surgeon: [name], MD;  Location: [facility];  Service: Open Heart Surgery;  Laterality: N/A;  . THYROIDECTOMY, PARTIAL  01/05/2009   Right Thyroid Lobectomy  . TONSILLECTOMY    . TOTAL NEPHRECTOMY Left 1970   Wilms Tumor left kidney excised as an infant    Current Outpatient Medications  Medication Sig Dispense Refill  . albuterol (PROVENTIL HFA;VENTOLIN HFA) 108 (90 BASE) MCG/ACT inhaler Inhale 1 puff into the lungs every 6 (six) hours as needed for wheezing or shortness of breath.    Marland Kitchen amiodarone (PACERONE) 200 MG tablet Take 1 tablet (200 mg total) by mouth 2 (two) times daily after a meal. 60 tablet 1  . aspirin EC 81 MG EC tablet Take 1 tablet (81 mg total) by mouth daily. Swallow whole. 30 tablet 11  . atorvastatin (LIPITOR) 10 MG tablet Take 1 tablet (10 mg total) by mouth daily. 30 tablet 1  . calcium carbonate (TUMS - DOSED IN MG ELEMENTAL CALCIUM) 500 MG chewable tablet Chew 500 mg by mouth daily as needed for indigestion or heartburn.     . fluticasone (FLONASE) 50 MCG/ACT nasal spray Place 1 spray into both nostrils daily as needed for allergies or rhinitis.    Marland Kitchen  loratadine (CLARITIN) 10 MG tablet Take 10 mg by mouth daily as needed for allergies.    . metFORMIN (GLUCOPHAGE) 1000 MG tablet Take 1 tablet (1,000 mg total) by mouth 2 (two) times daily.    . mometasone (ASMANEX) 220 MCG/INH inhaler Inhale 1 puff into the lungs daily.    . ONE TOUCH ULTRA TEST test strip 1 each by Other route as needed (GLUCOSE).     Marland Kitchen pioglitazone (ACTOS) 15 MG tablet Take 15 mg by mouth daily.    . repaglinide (PRANDIN) 2 MG tablet Take 2 mg by mouth 2 (two) times daily.    Marland Kitchen warfarin (COUMADIN) 5 MG tablet Take 1 tablet (5 mg total) by mouth daily at 4 PM. Or as directed by the coumadin Clinic 30 tablet 2  . TOPROL XL 25 MG 24 hr tablet Take 1 tablet (25 mg total) by mouth 2 (two) times daily.     No current facility-administered medications for this  encounter.    Allergies  Allergen Reactions  . Empagliflozin     Other reaction(s): Recurrent UTI    Social History   Socioeconomic History  . Marital status: Married    Spouse name: Not on file  . Number of children: Not on file  . Years of education: Not on file  . Highest education level: Not on file  Occupational History  . Occupation: executive  Tobacco Use  . Smoking status: Never Smoker  . Smokeless tobacco: Never Used  Vaping Use  . Vaping Use: Never used  Substance and Sexual Activity  . Alcohol use: Yes    Alcohol/week: 1.0 standard drink    Types: 1 Glasses of wine per week    Comment: occasional  . Drug use: No  . Sexual activity: Yes  Other Topics Concern  . Not on file  Social History Narrative   Never smoked    Alcohol yes, rare ,1-2 per week   No recreational drugs   Occupation-  Geneticist, molecular and other executive courses   Marital status - married     Children 1 boy         Social Determinants of Radio broadcast assistant Strain: Not on file  Food Insecurity: Not on file  Transportation Needs: Not on file  Physical Activity: Not on file  Stress: Not on file  Social Connections: Not on file  Intimate Partner Violence: Not on file    Family History  Problem Relation Age of Onset  . Heart disease Father   . Hernia Father   . Healthy Sister   . Stroke Paternal Grandmother   . Cancer Mother        THYROID  . Hypotension Mother   . Heart attack Maternal Grandfather   . Congestive Heart Failure Maternal Grandfather   . Heart attack Paternal Grandfather   . Colon cancer Maternal Aunt 79  . Congestive Heart Failure Paternal Uncle   . Congestive Heart Failure Maternal Grandmother   . Colon cancer Other 60       MGMs brother  . Colon cancer Other        MGFs brother    ROS- All systems are reviewed and negative except as per the HPI above  Physical Exam: Vitals:   06/08/20 1427  BP: (!) 142/84  Pulse: (!) 110   Weight: 86.6 kg  Height: 6' (1.829 m)   Wt Readings from Last 3 Encounters:  06/08/20 86.6 kg  05/29/20 85.5 kg  05/14/20 85.5 kg    Labs: Lab Results  Component Value Date   NA 135 05/12/2020   K 3.8 05/12/2020   CL 103 05/12/2020   CO2 19 (L) 05/12/2020   GLUCOSE 174 (H) 05/12/2020   BUN 13 05/12/2020   CREATININE 1.06 05/12/2020   CALCIUM 8.7 (L) 05/12/2020   MG 1.5 (L) 05/12/2020   Lab Results  Component Value Date   INR 1.5 (A) 06/07/2020   Lab Results  Component Value Date   CHOL 116 03/05/2014   HDL 21 (L) 03/05/2014   LDLCALC 71 03/05/2014   TRIG 122 03/05/2014     GEN- The patient is well appearing, alert and oriented x 3 today.   Head- normocephalic, atraumatic Eyes-  Sclera clear, conjunctiva pink Ears- hearing intact Oropharynx- clear Neck- supple, no JVP Lymph- no cervical lymphadenopathy Lungs- Clear to ausculation bilaterally, normal work of breathing Heart- Fast regular rate and rhythm, no murmurs, rubs or gallops, PMI not laterally displaced GI- soft, NT, ND, + BS Extremities- no clubbing, cyanosis, or edema MS- no significant deformity or atrophy Skin- no rash or lesion Psych- euthymic mood, full affect Neuro- strength and sensation are intact  EKG-Appears to be typical atrial flutter at 110 bpm, qrs int 106 ms, qtc 503 ms    Epic records reviewed   Assessment and Plan: 1. Afib/flutter  Onset yesterday pm Tolerating  Well Continue amiodarone 200 mg bid  Increase  Toprol xl 25 mg to  bid for  extra rate control and to encourage  return to  SR If he does convert over the weekend he can go back to BB 1x a day He will call on Monday and report to office if he is still in A flutter or converted Reassured  If needs any extra advice over the week end,  can call the after hours  answering service  2. CHA2DS2VASc of at least 4   Continue  warfarin   3. MVR 05/09/20 Per Dr Roxy Manns Has f/u on Monday   Hildagarde Holleran C. Cleotha Whalin, [facility] [address] [place], [place] [address] [phone]

## 2020-06-08 NOTE — Telephone Encounter (Signed)
I placed call to patient but it went to voicemail.  I left a message that I would send him a my chart message.

## 2020-06-11 ENCOUNTER — Ambulatory Visit (INDEPENDENT_AMBULATORY_CARE_PROVIDER_SITE_OTHER): Payer: Self-pay | Admitting: Physician Assistant

## 2020-06-11 ENCOUNTER — Ambulatory Visit
Admission: RE | Admit: 2020-06-11 | Discharge: 2020-06-11 | Disposition: A | Discharge status: Home / Self Care | Payer: BC Managed Care – PPO | Source: Ambulatory Visit | Attending: Thoracic Surgery (Cardiothoracic Vascular Surgery) | Admitting: Thoracic Surgery (Cardiothoracic Vascular Surgery)

## 2020-06-11 ENCOUNTER — Other Ambulatory Visit: Payer: Self-pay

## 2020-06-11 ENCOUNTER — Telehealth (HOSPITAL_COMMUNITY): Payer: Self-pay | Admitting: *Deleted

## 2020-06-11 ENCOUNTER — Encounter: Payer: Self-pay | Admitting: Physician Assistant

## 2020-06-11 VITALS — BP 123/79 | HR 111 | Temp 98.9°F | Resp 20 | Ht 72.0 in | Wt 191.0 lb

## 2020-06-11 DIAGNOSIS — J90 Pleural effusion, not elsewhere classified: Secondary | ICD-10-CM | POA: Diagnosis not present

## 2020-06-11 DIAGNOSIS — Z9889 Other specified postprocedural states: Secondary | ICD-10-CM

## 2020-06-11 NOTE — Telephone Encounter (Signed)
Patient called in stating he continues in AFib. HR 110-120 BP 120-140/80-90 just feels little more fatigued but otherwise ok.  Discussed with Roderic Palau NP will increase amiodarone to 400mg  tonight and tomorrow morning then call with update of HR/BP.

## 2020-06-11 NOTE — Progress Notes (Signed)
HPI:  Patient returns for routine postoperative follow-up having undergone a minimally invasive mitral valve repair by Dr. Roxy Manns on 05/09/2020. The patient's early postoperative recovery while in the hospital was notable for accelerated junctional rhythm vs a fib with RVR. He was put on Amiodarone.  He was already on Coumadin the mitral valve repair. His last INR in the hospital was 1.3. Patient denies chest pain or shortness of breath.  Current Outpatient Medications  Medication Sig Dispense Refill  . albuterol (PROVENTIL HFA;VENTOLIN HFA) 108 (90 BASE) MCG/ACT inhaler Inhale 1 puff into the lungs every 6 (six) hours as needed for wheezing or shortness of breath.    Marland Kitchen amiodarone (PACERONE) 200 MG tablet Take 1 tablet (200 mg total) by mouth 2 (two) times daily after a meal. 60 tablet 1  . aspirin EC 81 MG EC tablet Take 1 tablet (81 mg total) by mouth daily. Swallow whole. 30 tablet 11  . atorvastatin (LIPITOR) 10 MG tablet Take 1 tablet (10 mg total) by mouth daily. 30 tablet 1  . calcium carbonate (TUMS - DOSED IN MG ELEMENTAL CALCIUM) 500 MG chewable tablet Chew 500 mg by mouth daily as needed for indigestion or heartburn.     . fluticasone (FLONASE) 50 MCG/ACT nasal spray Place 1 spray into both nostrils daily as needed for allergies or rhinitis.    Marland Kitchen loratadine (CLARITIN) 10 MG tablet Take 10 mg by mouth daily as needed for allergies.    . metFORMIN (GLUCOPHAGE) 1000 MG tablet Take 1 tablet (1,000 mg total) by mouth 2 (two) times daily.    . mometasone (ASMANEX) 220 MCG/INH inhaler Inhale 1 puff into the lungs daily.    . ONE TOUCH ULTRA TEST test strip 1 each by Other route as needed (GLUCOSE).     Marland Kitchen pioglitazone (ACTOS) 15 MG tablet Take 15 mg by mouth daily.    . repaglinide (PRANDIN) 2 MG tablet Take 2 mg by mouth 2 (two) times daily.    . TOPROL XL 25 MG 24 hr tablet Take 1 tablet (25 mg total) by mouth 2 (two) times daily.    Marland Kitchen warfarin (COUMADIN) 5 MG tablet Take 1 tablet (5 mg  total) by mouth daily at 4 PM. Or as directed by the coumadin Clinic 30 tablet 2  Vital Signs: BP 123/79, HR 111, RR 20, Oxygenation 98% on room air   Physical Exam: CV-IRRR Pulmonary-Clear to auscultation bilaterally Abdomen-Soft, non tender Extremities-No LE edema Wounds-Clean and dry.  Diagnostic Tests: CLINICAL DATA:  Status post mitral valve repair.  EXAM: CHEST - 2 VIEW  COMPARISON:  May 13, 2020.  FINDINGS: The heart size and mediastinal contours are within normal limits. Status post cardiac valve repair. No pneumothorax is noted. Left lung is clear. Minimal right pleural effusion is noted. No significant consolidation is noted. The visualized skeletal structures are unremarkable.  IMPRESSION: Minimal right pleural effusion.   Electronically Signed   By: Marijo Conception M.D.   On: 06/11/2020 13:20  Impression and Plan: Overall, [name] is continuing to recover from minimally invasive mitral valve surgery. When felt his heart racing (and his Apple watch said a flutter), he contacted cardiology and was then seen by Roderic Palau NP in the a fib clinic on 03/11. The only medication change made was to increase the Toprol XL to bid over the weekend as he was in a fib/flutter and heart rate at that time was 110. He was to continue with Amiodarone 200 mg bid. His last  INR appointment was on 03/10 and was 1.5. His next INR appointment is on 03/17.   Also, he has an appointment for post op echo on 03/28. Patient instructed no strenuous activity until HR better controlled. He has been driving as he is not taking narcotics for pain. He will need antibiotic prophylaxis for any dental procedure in the future to avoid endocarditis. Regarding a fib/flutter, he is asymptomatic. We will not titrate Toprol XL as BP will tolerate current bid dosing. He may need additional oral Amiodarone. Patient was instructed to contact a fib clinic as soon as possible. He may ultimately need DCCV.  He will return to see Dr. Roxy Manns in about 4 weeks.     Nani Skillern, PA-C [facility] [phone]

## 2020-06-12 NOTE — Telephone Encounter (Signed)
Pt continues in AF - per Roderic Palau NP will continue increased amiodarone at 400mg  BID and see in clinic tomorrow afternoon. Pt in agreement.

## 2020-06-13 ENCOUNTER — Other Ambulatory Visit: Payer: Self-pay

## 2020-06-13 ENCOUNTER — Ambulatory Visit (HOSPITAL_COMMUNITY)
Admission: RE | Admit: 2020-06-13 | Discharge: 2020-06-13 | Disposition: A | Discharge status: Home / Self Care | Payer: BC Managed Care – PPO | Source: Ambulatory Visit | Attending: Nurse Practitioner | Admitting: Nurse Practitioner

## 2020-06-13 ENCOUNTER — Encounter (HOSPITAL_COMMUNITY): Payer: Self-pay | Admitting: Nurse Practitioner

## 2020-06-13 VITALS — BP 124/84 | HR 96 | Ht 72.0 in | Wt 193.6 lb

## 2020-06-13 DIAGNOSIS — Z8249 Family history of ischemic heart disease and other diseases of the circulatory system: Secondary | ICD-10-CM | POA: Insufficient documentation

## 2020-06-13 DIAGNOSIS — I4892 Unspecified atrial flutter: Secondary | ICD-10-CM | POA: Insufficient documentation

## 2020-06-13 DIAGNOSIS — Z79899 Other long term (current) drug therapy: Secondary | ICD-10-CM | POA: Insufficient documentation

## 2020-06-13 DIAGNOSIS — I4891 Unspecified atrial fibrillation: Secondary | ICD-10-CM | POA: Insufficient documentation

## 2020-06-13 DIAGNOSIS — Z7982 Long term (current) use of aspirin: Secondary | ICD-10-CM | POA: Insufficient documentation

## 2020-06-13 DIAGNOSIS — I484 Atypical atrial flutter: Secondary | ICD-10-CM

## 2020-06-13 DIAGNOSIS — D6869 Other thrombophilia: Secondary | ICD-10-CM

## 2020-06-13 DIAGNOSIS — I10 Essential (primary) hypertension: Secondary | ICD-10-CM | POA: Diagnosis not present

## 2020-06-13 DIAGNOSIS — Z7984 Long term (current) use of oral hypoglycemic drugs: Secondary | ICD-10-CM | POA: Insufficient documentation

## 2020-06-13 DIAGNOSIS — E785 Hyperlipidemia, unspecified: Secondary | ICD-10-CM | POA: Diagnosis not present

## 2020-06-13 DIAGNOSIS — E119 Type 2 diabetes mellitus without complications: Secondary | ICD-10-CM | POA: Diagnosis not present

## 2020-06-13 NOTE — Progress Notes (Signed)
Primary Care Physician: Orpah Melter, MD Referring Physician: Theodoro Kos street triage  Cardiologist: Dr. Irish Lack CT surgeon: Dr. Jacqulyn Cane [name] is a 55 y.o. 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  PMH also includes HTN, hyperlipidemia stage IIIc adenocarcinoma of the colon status post left colectomy 12/20 status post chemotherapy, and type 2 diabetes.  TEE 02/10/2020 showed severe mitral valve regurgitation with flail segment involving portions of the posterior leaflet.  EF 60-65%.  Diagnostic cardiac cath showed normal coronary arteries with no significant coronary artery disease.  PA pressures were normal.  He presented for surgery and was taken to the operating room 05/09/2020.  He received minimally invasive mitral valve repair.  He was atrially paced.  Anticoagulation with Coumadin was initiated postop day 1.  He was noted to have a rhythm change that was atrial fibrillation with RVR versus accelerated junctional 05/12/20.  He was placed on Lopressor and amiodarone.  He was discharged in stable condition on 05/14/2020.  He tracks his HR by his Apple watch. He has not been therapeutic on warfarin since the surgery.   He was seen in the afib clinic 06/08/20, for onset of atrial flutter noted per pt from his apple watch Thursday pm.  Otherwise, he felt  ok. He continues on amiodarone 200 mg bid and Toprol 25 mg qd. EKG shows typical  atrial flutter at 110 bpm, he reports that he left the hospital in [place] but had some breakthrough afib 2/16 and amiodarone was increased to 400 mg bid x one week. This is the first time he has noted afib since then. No obvious trigger. I increased toprol to bid.   Monday, 3/14, he called the office  and was still out of rhythm. I increased amiodarone to 400 mg bid and asked for him to come in today. Ekg shows an atrial flutter at 96 bpm. He is tolerating well.   Today, he denies symptoms of palpitations, chest pain, shortness of breath, orthopnea, PND, lower extremity edema, dizziness, presyncope, syncope, or neurologic sequela. The patient is tolerating medications without difficulties and is otherwise without complaint today.   Past Medical History:  Diagnosis Date  . Allergic rhinitis   . Asthma    animal dander & seasonal asthma  . Colon cancer ([place]) 01/26/2019  . Diabetes mellitus without complication ([place])    dx 2010  type 2  . Discitis of lumbosacral region    first started with this ..and rolled onto the endocarditis    stayed a month  . 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 Family history of colon cancer   . Family history of thyroid cancer   . H/O scoliosis   . 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 History of hiatal hernia   . Hyperlipidemia   . Hypertension    Diagnostic exercise tolerance test assessment:04/30/2010 : comments normal -no evidence os ischemia by ST analysis  . lipoma   . Lipoma 01/26/2019  . Mitral regurgitation    Echo 12/2018: EF 60-65, myxomatous mitral valve, severe mitral regurgitation, partial flail leaflet of posterior mitral valve, myxomatous tricuspid valve with trivial TR, ascending aorta mildly dilated (39 mm)  . Pneumonia    as child  . PONV (postoperative nausea  and vomiting)   . 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   Complex valvuloplasty including artificial Gore-tex neochord placement x6 with 28 mm Sorin Memo 4D ring annuloplasty via right mini thoracotomy approach  . Solitary kidney   . 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 ABDOMINAL AORTOGRAM N/A 02/10/2020   Procedure: ABDOMINAL AORTOGRAM;  Surgeon:  Jettie Booze, MD;  Location: [place] [facility];  Service: Cardiovascular;  Laterality: N/A;  . BIOPSY  01/07/2019   Procedure: BIOPSY;  Surgeon: Wilford Corner, MD;  Location: [facility];  Service: Endoscopy;;  . BIOPSY  01/03/2020   Procedure: BIOPSY;  Surgeon: Wilford Corner, MD;  Location: [facility];  Service: Endoscopy;;  . COLON SURGERY  03/04/2019   left colon segmental resection  . COLONOSCOPY WITH PROPOFOL N/A 01/07/2019   Procedure: COLONOSCOPY WITH PROPOFOL;  Surgeon: Wilford Corner, MD;  Location: [facility];  Service: Endoscopy;  Laterality: N/A;  . COLONOSCOPY WITH PROPOFOL N/A 01/03/2020   Procedure: COLONOSCOPY WITH PROPOFOL;  Surgeon: Wilford Corner, MD;  Location: [facility];  Service: Endoscopy;  Laterality: N/A;  . INGUINAL HERNIA REPAIR Left 04/23/2016   Procedure: LAPAROSCOPIC  INGUINAL REPAIR;  Surgeon: Clovis Riley, MD;  Location: [place];  Service: General;  Laterality: Left;  . KNEE SURGERY     arthroscopic left knee  . LIPOMA EXCISION  2015   x20 [facility]  . MITRAL VALVE REPAIR Right 05/09/2020   Procedure: MINIMALLY INVASIVE MITRAL VALVE REPAIR (MVR) USING LIVANOVA MEMO 4D 28MM MITRAL RING;  Surgeon: [name], MD;  Location: [place];  Service: Open Heart Surgery;  Laterality: Right;  . POLYPECTOMY  01/03/2020   Procedure: POLYPECTOMY;  Surgeon: Wilford Corner, MD;  Location: [facility];  Service: Endoscopy;;  . PORT-A-CATH REMOVAL    . PORTACATH PLACEMENT N/A 03/16/2019   Procedure: INSERTION PORT-A-CATH;  Surgeon: Michael Boston, MD;  Location: [facility];  Service: General;  Laterality: N/A;  . RIGHT/LEFT HEART CATH AND CORONARY ANGIOGRAPHY N/A 02/10/2020   Procedure: RIGHT/LEFT HEART CATH AND CORONARY ANGIOGRAPHY;  Surgeon: Jettie Booze, MD;  Location: [place] [facility];  Service: Cardiovascular;  Laterality: N/A;  . SUBMUCOSAL INJECTION  01/07/2019   Procedure: SUBMUCOSAL INJECTION;  Surgeon: Wilford Corner, MD;   Location: [facility];  Service: Endoscopy;;  . TEE WITHOUT CARDIOVERSION N/A 02/10/2020   Procedure: TRANSESOPHAGEAL ECHOCARDIOGRAM (TEE);  Surgeon: Acie Fredrickson Wonda Cheng, MD;  Location: [facility];  Service: Cardiovascular;  Laterality: N/A;  CATH AFTER TEE  . TEE WITHOUT CARDIOVERSION N/A 05/09/2020   Procedure: TRANSESOPHAGEAL ECHOCARDIOGRAM (TEE);  Surgeon: [name], MD;  Location: [place];  Service: Open Heart Surgery;  Laterality: N/A;  . THYROIDECTOMY, PARTIAL  01/05/2009   Right Thyroid Lobectomy  . TONSILLECTOMY    . TOTAL NEPHRECTOMY Left 1970   Wilms Tumor left kidney excised as an infant    Current Outpatient Medications  Medication Sig Dispense Refill  . albuterol (PROVENTIL HFA;VENTOLIN HFA) 108 (90 BASE) MCG/ACT inhaler Inhale 1 puff into the lungs every 6 (six) hours as needed for wheezing or shortness of breath.    Marland Kitchen amiodarone (PACERONE) 200 MG tablet Take 1 tablet (200 mg total) by mouth 2 (two) times daily after a meal. 60 tablet 1  . aspirin EC 81 MG EC tablet Take 1 tablet (81 mg total) by mouth daily. Swallow whole. 30 tablet 11  . atorvastatin (LIPITOR) 10 MG  tablet Take 1 tablet (10 mg total) by mouth daily. 30 tablet 1  . calcium carbonate (TUMS - DOSED IN MG ELEMENTAL CALCIUM) 500 MG chewable tablet Chew 500 mg by mouth daily as needed for indigestion or heartburn.     . fluticasone (FLONASE) 50 MCG/ACT nasal spray Place 1 spray into both nostrils daily as needed for allergies or rhinitis.    Marland Kitchen loratadine (CLARITIN) 10 MG tablet Take 10 mg by mouth daily as needed for allergies.    . metFORMIN (GLUCOPHAGE) 1000 MG tablet Take 1 tablet (1,000 mg total) by mouth 2 (two) times daily.    . mometasone (ASMANEX) 220 MCG/INH inhaler Inhale 1 puff into the lungs daily.    . ONE TOUCH ULTRA TEST test strip 1 each by Other route as needed (GLUCOSE).     Marland Kitchen pioglitazone (ACTOS) 15 MG tablet Take 15 mg by mouth daily.    . repaglinide (PRANDIN) 2 MG tablet Take 2 mg by mouth  2 (two) times daily.    . TOPROL XL 25 MG 24 hr tablet Take 1 tablet (25 mg total) by mouth 2 (two) times daily.    Marland Kitchen warfarin (COUMADIN) 5 MG tablet Take 1 tablet (5 mg total) by mouth daily at 4 PM. Or as directed by the coumadin Clinic 30 tablet 2   No current facility-administered medications for this encounter.    Allergies  Allergen Reactions  . Empagliflozin     Other reaction(s): Recurrent UTI    Social History   Socioeconomic History  . Marital status: Married    Spouse name: Not on file  . Number of children: Not on file  . Years of education: Not on file  . Highest education level: Not on file  Occupational History  . Occupation: executive  Tobacco Use  . Smoking status: Never Smoker  . Smokeless tobacco: Never Used  Vaping Use  . Vaping Use: Never used  Substance and Sexual Activity  . Alcohol use: Yes    Alcohol/week: 1.0 standard drink    Types: 1 Glasses of wine per week    Comment: occasional  . Drug use: No  . Sexual activity: Yes  Other Topics Concern  . Not on file  Social History Narrative   Never smoked    Alcohol yes, rare ,1-2 per week   No recreational drugs   Occupation-  Geneticist, molecular and other executive courses   Marital status - married     Children 1 boy         Social Determinants of Radio broadcast assistant Strain: Not on file  Food Insecurity: Not on file  Transportation Needs: Not on file  Physical Activity: Not on file  Stress: Not on file  Social Connections: Not on file  Intimate Partner Violence: Not on file    Family History  Problem Relation Age of Onset  . Heart disease Father   . Hernia Father   . Healthy Sister   . Stroke Paternal Grandmother   . Cancer Mother        THYROID  . Hypotension Mother   . Heart attack Maternal Grandfather   . Congestive Heart Failure Maternal Grandfather   . Heart attack Paternal Grandfather   . Colon cancer Maternal Aunt 79  . Congestive Heart Failure  Paternal Uncle   . Congestive Heart Failure Maternal Grandmother   . Colon cancer Other 70       MGMs brother  . Colon  cancer Other        MGFs brother    ROS- All systems are reviewed and negative except as per the HPI above  Physical Exam: Vitals:   06/13/20 1540  BP: 124/84  Pulse: 96  Weight: 87.8 kg  Height: 6' (1.829 m)   Wt Readings from Last 3 Encounters:  06/13/20 87.8 kg  06/11/20 86.6 kg  06/08/20 86.6 kg    Labs: Lab Results  Component Value Date   NA 135 05/12/2020   K 3.8 05/12/2020   CL 103 05/12/2020   CO2 19 (L) 05/12/2020   GLUCOSE 174 (H) 05/12/2020   BUN 13 05/12/2020   CREATININE 1.06 05/12/2020   CALCIUM 8.7 (L) 05/12/2020   MG 1.5 (L) 05/12/2020   Lab Results  Component Value Date   INR 1.5 (A) 06/07/2020   Lab Results  Component Value Date   CHOL 116 03/05/2014   HDL 21 (L) 03/05/2014   LDLCALC 71 03/05/2014   TRIG 122 03/05/2014     GEN- The patient is well appearing, alert and oriented x 3 today.   Head- normocephalic, atraumatic Eyes-  Sclera clear, conjunctiva pink Ears- hearing intact Oropharynx- clear Neck- supple, no JVP Lymph- no cervical lymphadenopathy Lungs- Clear to ausculation bilaterally, normal work of breathing Heart- irregular rate and rhythm, no murmurs, rubs or gallops, PMI not laterally displaced GI- soft, NT, ND, + BS Extremities- no clubbing, cyanosis, or edema MS- no significant deformity or atrophy Skin- no rash or lesion Psych- euthymic mood, full affect Neuro- strength and sensation are intact  EKG-Appears to be atypical atrial flutter today with variable block at 96 bpm, qrs int 122 ms, qtc 507 ms    Epic records reviewed   Assessment and Plan: 1. Afib/flutter with RVR Onset after MVR 05/09/20 Father has had 2 previous ablations  Was maintaining SR  for the last 3-4 weeks  Reoccurrence  of atrial flutter  06/07/20 Tolerating  well Decrease  amiodarone back to 200 mg bid, had been on 400 mg bid  since Monday  Continue Toprol xl 25 mg  bid for  extra rate control    He will likely need a TEE guided  cardioversion I discussed with Dr.Owen and he agreed with the plan to proceed with TEE guided cardioversion and could switch pt over to [place]   Discussed with pt and wife risks vrs benefit and they would like to proceed  He will be eligible for TEE guided CV after 3 days on xarelto  He will reduce amiodarone to one tab daily am of cardioversion and decrease toprol back to once daily   2. CHA2DS2VASc of at least 4   He has not been therapeutic on warfarin since surgery  He does have his INR checked again tomorrow and will get PharmD's help to transition over to xarelto 20 mg daily   After being on xarelto 3 days, he will be eligible for TEE guided CV  3. MVR 05/09/20 Per Dr Roxy Manns Has f/u with him 4/11  If  ERAF,  he may benefit from ablation   [place]. Dannon Nguyenthi, [facility] [address] [place], [place] [address] [phone]

## 2020-06-14 ENCOUNTER — Encounter (HOSPITAL_COMMUNITY): Payer: Self-pay

## 2020-06-14 ENCOUNTER — Ambulatory Visit (INDEPENDENT_AMBULATORY_CARE_PROVIDER_SITE_OTHER): Payer: BC Managed Care – PPO | Admitting: *Deleted

## 2020-06-14 DIAGNOSIS — Z5181 Encounter for therapeutic drug level monitoring: Secondary | ICD-10-CM | POA: Diagnosis not present

## 2020-06-14 DIAGNOSIS — I4891 Unspecified atrial fibrillation: Secondary | ICD-10-CM

## 2020-06-14 DIAGNOSIS — Z9889 Other specified postprocedural states: Secondary | ICD-10-CM | POA: Diagnosis not present

## 2020-06-14 LAB — POCT INR: INR: 2.4 (ref 2.0–3.0)

## 2020-06-14 MED ORDER — RIVAROXABAN 20 MG PO TABS
20.0000 mg | ORAL_TABLET | Freq: Every day | ORAL | 0 refills | Status: DC
Start: 2020-06-14 — End: 2020-06-14

## 2020-06-14 MED ORDER — RIVAROXABAN 20 MG PO TABS: 20.0000 mg | ORAL_TABLET | Freq: Every day | ORAL | 0 refills | Status: DC

## 2020-06-14 MED ORDER — RIVAROXABAN 20 MG PO TABS: 20.0000 mg | ORAL_TABLET | Freq: Every day | ORAL | 1 refills | Status: DC

## 2020-06-14 NOTE — Patient Instructions (Signed)
Description   Stop taking warfarin. Start taking Xarelto this evening 20mg  (1 tablet daily) with the largest meal.   A full discussion of the nature of anticoagulants has been carried out.  A benefit/risk analysis has been presented to the patient, so that they understand the justification for choosing anticoagulation with Xarelto at this time.  The need for compliance is stressed.  Pt is aware to take the medication once daily with the largest meal of the day.  Side effects of potential bleeding are discussed, including unusual colored urine or stools, coughing up blood or coffee ground emesis, nose bleeds or serious fall or head trauma.  Discussed signs and symptoms of stroke. The patient should avoid any OTC items containing aspirin or ibuprofen.  Avoid alcohol consumption.   Call if any signs of abnormal bleeding.  Discussed financial obligations and resolved any difficulty in obtaining medication.

## 2020-06-15 ENCOUNTER — Telehealth (HOSPITAL_COMMUNITY): Payer: Self-pay

## 2020-06-15 NOTE — Telephone Encounter (Signed)
Called and spoke with pt in regards to CR, pt stated he is not interested at this time.   Closed referral 

## 2020-06-22 ENCOUNTER — Other Ambulatory Visit: Payer: Self-pay

## 2020-06-22 ENCOUNTER — Ambulatory Visit (HOSPITAL_COMMUNITY)
Admission: RE | Admit: 2020-06-22 | Discharge: 2020-06-22 | Disposition: A | Discharge status: Home / Self Care | Payer: BC Managed Care – PPO | Source: Ambulatory Visit | Attending: Nurse Practitioner | Admitting: Nurse Practitioner

## 2020-06-22 ENCOUNTER — Encounter (HOSPITAL_COMMUNITY): Payer: Self-pay | Admitting: Nurse Practitioner

## 2020-06-22 VITALS — BP 146/84 | HR 59 | Ht 72.0 in | Wt 195.6 lb

## 2020-06-22 DIAGNOSIS — Z7984 Long term (current) use of oral hypoglycemic drugs: Secondary | ICD-10-CM | POA: Diagnosis not present

## 2020-06-22 DIAGNOSIS — Z7982 Long term (current) use of aspirin: Secondary | ICD-10-CM | POA: Insufficient documentation

## 2020-06-22 DIAGNOSIS — Z8249 Family history of ischemic heart disease and other diseases of the circulatory system: Secondary | ICD-10-CM | POA: Insufficient documentation

## 2020-06-22 DIAGNOSIS — I480 Paroxysmal atrial fibrillation: Secondary | ICD-10-CM

## 2020-06-22 DIAGNOSIS — D6869 Other thrombophilia: Secondary | ICD-10-CM

## 2020-06-22 DIAGNOSIS — I4891 Unspecified atrial fibrillation: Secondary | ICD-10-CM | POA: Diagnosis not present

## 2020-06-22 DIAGNOSIS — I4892 Unspecified atrial flutter: Secondary | ICD-10-CM | POA: Insufficient documentation

## 2020-06-22 DIAGNOSIS — I484 Atypical atrial flutter: Secondary | ICD-10-CM

## 2020-06-22 DIAGNOSIS — E785 Hyperlipidemia, unspecified: Secondary | ICD-10-CM | POA: Diagnosis not present

## 2020-06-22 DIAGNOSIS — E119 Type 2 diabetes mellitus without complications: Secondary | ICD-10-CM | POA: Diagnosis not present

## 2020-06-22 DIAGNOSIS — Z79899 Other long term (current) drug therapy: Secondary | ICD-10-CM | POA: Diagnosis not present

## 2020-06-22 MED ORDER — AMIODARONE HCL 200 MG PO TABS: 200.0000 mg | ORAL_TABLET | Freq: Every day | ORAL | 1 refills | Status: DC

## 2020-06-22 NOTE — Patient Instructions (Signed)
Decrease Amiodarone to 200mg once a day 

## 2020-06-22 NOTE — Progress Notes (Addendum)
Primary Care Physician: Orpah Melter, MD Referring Physician: Theodoro Kos street triage  Cardiologist: Dr. Irish Lack CT surgeon: Dr. Jacqulyn Cane [name] is a 55 y.o. 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  PMH also includes HTN, hyperlipidemia stage IIIc adenocarcinoma of the colon status post left colectomy 12/20 status post chemotherapy, and type 2 diabetes.  TEE 02/10/2020 showed severe mitral valve regurgitation with flail segment involving portions of the posterior leaflet.  EF 60-65%.  Diagnostic cardiac cath showed normal coronary arteries with no significant coronary artery disease.  PA pressures were normal.  He presented for surgery and was taken to the operating room 05/09/2020.  He received minimally invasive mitral valve repair.  He was atrially paced.  Anticoagulation with Coumadin was initiated postop day 1.  He was noted to have a rhythm change that was atrial fibrillation with RVR versus accelerated junctional 05/12/20.  He was placed on Lopressor and amiodarone.  He was discharged in stable condition on 05/14/2020.  He tracks his HR by his Apple watch. He has not been therapeutic on warfarin since the surgery.   He was seen in the afib clinic 06/08/20, for onset of atrial flutter noted per pt from his apple watch Thursday pm.  Otherwise, he felt  ok. He continues on amiodarone 200 mg bid and Toprol 25 mg qd. EKG shows typical  atrial flutter at 110 bpm, he reports that he left the hospital in [place] but had some breakthrough afib 2/16 and amiodarone was increased to 400 mg bid x one week. This is the first time he has noted afib since then. No obvious trigger. I increased toprol to bid.   Monday, 3/14, he called the office  and was still out of rhythm. I increased amiodarone to 400 mg bid for a few doses as he was still on 200 mg bid,  and asked for him to come in today. Ekg shows an atrial flutter at 96 bpm. He is tolerating well.   F/u in afib clinic, 06/22/20, he has transitioned  over to xarelto off coumadin, and had converted to SR, reported by patient after I saw him last. Today, he remains in [place]. He feels improved.   Today, he denies symptoms of palpitations, chest pain, shortness of breath, orthopnea, PND, lower extremity edema, dizziness, presyncope, syncope, or neurologic sequela. The patient is tolerating medications without difficulties and is otherwise without complaint today.   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   History of hiatal hernia    Hyperlipidemia    Hypertension    Diagnostic exercise tolerance test assessment:04/30/2010 : comments normal -no evidence os ischemia by ST analysis   lipoma    Lipoma  01/26/2019   Mitral regurgitation    Echo 12/2018: EF 60-65, myxomatous mitral valve, severe mitral regurgitation, partial flail leaflet of posterior mitral valve, myxomatous tricuspid valve with trivial TR, ascending aorta mildly dilated (39 mm)   Pneumonia    as child   PONV (postoperative nausea and vomiting)    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   Complex valvuloplasty including artificial Gore-tex neochord placement x6 with 28 mm Sorin Memo 4D ring annuloplasty via right mini thoracotomy  approach   Solitary kidney    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   Past Surgical History:  Procedure Laterality Date   ABDOMINAL AORTOGRAM N/A 02/10/2020   Procedure: ABDOMINAL AORTOGRAM;  Surgeon: Jettie Booze, MD;  Location: [facility];  Service: Cardiovascular;  Laterality: N/A;   BIOPSY  01/07/2019   Procedure: BIOPSY;  Surgeon: Wilford Corner, MD;  Location: [facility];  Service: Endoscopy;;   BIOPSY  01/03/2020   Procedure: BIOPSY;  Surgeon: Wilford Corner, MD;  [facility]: [facility];  Service: Endoscopy;;   COLON SURGERY  03/04/2019   left colon segmental resection   COLONOSCOPY WITH PROPOFOL N/A 01/07/2019   Procedure: COLONOSCOPY WITH PROPOFOL;  Surgeon: Wilford Corner, MD;  [facility];  Service: Endoscopy;  Laterality: N/A;   COLONOSCOPY WITH PROPOFOL N/A 01/03/2020   Procedure: COLONOSCOPY WITH PROPOFOL;  Surgeon: Wilford Corner, MD;  [facility];  Service: Endoscopy;  Laterality: N/A;   INGUINAL HERNIA REPAIR Left 04/23/2016   Procedure: LAPAROSCOPIC  INGUINAL REPAIR;  Surgeon: Clovis Riley, MD;  Location: [facility];  Service: General;  Laterality: Left;   KNEE SURGERY     arthroscopic left knee   LIPOMA EXCISION  2015   x20 [facility]   MITRAL VALVE REPAIR Right 05/09/2020   Procedure: MINIMALLY INVASIVE MITRAL VALVE REPAIR (MVR) USING LIVANOVA MEMO 4D 28MM MITRAL RING;  Surgeon: [name], MD;  Location: [facility];  Service: Open Heart Surgery;  Laterality: Right;   POLYPECTOMY  01/03/2020   Procedure: POLYPECTOMY;  Surgeon: Wilford Corner, MD;  [facility];  Service: Endoscopy;;   PORT-A-CATH REMOVAL     PORTACATH PLACEMENT N/A 03/16/2019   Procedure: INSERTION PORT-A-CATH;  Surgeon: Michael Boston, MD;  [facility]: [facility];  Service: General;  Laterality: N/A;   RIGHT/LEFT HEART CATH AND CORONARY ANGIOGRAPHY N/A 02/10/2020   Procedure: RIGHT/LEFT HEART CATH AND  CORONARY ANGIOGRAPHY;  Surgeon: Jettie Booze, MD;  Location: [facility];  Service: Cardiovascular;  Laterality: N/A;   SUBMUCOSAL INJECTION  01/07/2019   Procedure: SUBMUCOSAL INJECTION;  Surgeon: Wilford Corner, MD;  [facility];  Service: Endoscopy;;   TEE WITHOUT CARDIOVERSION N/A 02/10/2020   Procedure: TRANSESOPHAGEAL ECHOCARDIOGRAM (TEE);  Surgeon: Acie Fredrickson Wonda Cheng, MD;  Location: [facility];  Service: Cardiovascular;  Laterality: N/A;  CATH AFTER TEE   TEE WITHOUT CARDIOVERSION N/A 05/09/2020   Procedure: TRANSESOPHAGEAL ECHOCARDIOGRAM (TEE);  Surgeon: [name], MD;  Location: [facility];  Service: Open Heart Surgery;  Laterality: N/A;   THYROIDECTOMY, PARTIAL  01/05/2009   Right Thyroid Lobectomy   TONSILLECTOMY     TOTAL NEPHRECTOMY Left 1970   Wilms Tumor left kidney excised as an infant    Current Outpatient Medications  Medication Sig Dispense Refill   albuterol (PROVENTIL HFA;VENTOLIN HFA) 108 (90 BASE) MCG/ACT inhaler Inhale 1 puff into the lungs every 6 (six) hours as needed for wheezing or shortness of breath.     amiodarone (  PACERONE) 200 MG tablet Take 1 tablet (200 mg total) by mouth 2 (two) times daily after a meal. 60 tablet 1   aspirin EC 81 MG EC tablet Take 1 tablet (81 mg total) by mouth daily. Swallow whole. 30 tablet 11   atorvastatin (LIPITOR) 10 MG tablet Take 1 tablet (10 mg total) by mouth daily. 30 tablet 1   calcium carbonate (TUMS - DOSED IN MG ELEMENTAL CALCIUM) 500 MG chewable tablet Chew 500 mg by mouth daily as needed for indigestion or heartburn.      fluticasone (FLONASE) 50 MCG/ACT nasal spray Place 1 spray into both nostrils daily as needed for allergies or rhinitis.     loratadine (CLARITIN) 10 MG tablet Take 10 mg by mouth daily as needed for allergies.     metFORMIN (GLUCOPHAGE) 1000 MG tablet Take 1 tablet (1,000 mg total) by mouth 2 (two) times daily.     mometasone (ASMANEX) 220 MCG/INH inhaler  Inhale 1 puff into the lungs daily.     pioglitazone (ACTOS) 15 MG tablet Take 15 mg by mouth daily.     repaglinide (PRANDIN) 2 MG tablet Take 2 mg by mouth 2 (two) times daily.     rivaroxaban (XARELTO) 20 MG TABS tablet Take 1 tablet (20 mg total) by mouth daily with supper. 90 tablet 1   TOPROL XL 25 MG 24 hr tablet Take 1 tablet (25 mg total) by mouth 2 (two) times daily.     ONE TOUCH ULTRA TEST test strip 1 each by Other route as needed (GLUCOSE).      No current facility-administered medications for this encounter.    Allergies  Allergen Reactions   Empagliflozin     Other reaction(s): Recurrent UTI    Social History   Socioeconomic History   Marital status: Married    Spouse name: Not on file   Number of children: Not on file   Years of education: Not on file   Highest education level: Not on file  Occupational History   Occupation: executive  Tobacco Use   Smoking status: Never Smoker   Smokeless tobacco: Never Used  Vaping Use   Vaping Use: Never used  Substance and Sexual Activity   Alcohol use: Yes    Alcohol/week: 1.0 standard drink    Types: 1 Glasses of wine per week    Comment: occasional   Drug use: No   Sexual activity: Yes  Other Topics Concern   Not on file  Social History Narrative   Never smoked    Alcohol yes, rare ,1-2 per week   No recreational drugs   Occupation-  Geneticist, molecular and other executive courses   Marital status - married     Children 1 boy         Social Determinants of Radio broadcast assistant Strain: Not on file  Food Insecurity: Not on file  Transportation Needs: Not on file  Physical Activity: Not on file  Stress: Not on file  Social Connections: Not on file  Intimate Partner Violence: Not on file    Family History  Problem Relation Age of Onset   Heart disease Father    Hernia Father    Healthy Sister    Stroke Paternal Grandmother    Cancer Mother        THYROID    Hypotension Mother    Heart attack Maternal Grandfather    Congestive Heart Failure Maternal Grandfather    Heart attack  Paternal Grandfather    Colon cancer Maternal Aunt 79   Congestive Heart Failure Paternal Uncle    Congestive Heart Failure Maternal Grandmother    Colon cancer Other 30       MGMs brother   Colon cancer Other        MGFs brother    ROS- All systems are reviewed and negative except as per the HPI above  Physical Exam: There were no vitals filed for this visit. Wt Readings from Last 3 Encounters:  06/13/20 87.8 kg  06/11/20 86.6 kg  06/08/20 86.6 kg    Labs: Lab Results  Component Value Date   NA 135 05/12/2020   K 3.8 05/12/2020   CL 103 05/12/2020   CO2 19 (L) 05/12/2020   GLUCOSE 174 (H) 05/12/2020   BUN 13 05/12/2020   CREATININE 1.06 05/12/2020   CALCIUM 8.7 (L) 05/12/2020   MG 1.5 (L) 05/12/2020   Lab Results  Component Value Date   INR 2.4 06/14/2020   Lab Results  Component Value Date   CHOL 116 03/05/2014   HDL 21 (L) 03/05/2014   LDLCALC 71 03/05/2014   TRIG 122 03/05/2014     GEN- The patient is well appearing, alert and oriented x 3 today.   Head- normocephalic, atraumatic Eyes-  Sclera clear, conjunctiva pink Ears- hearing intact Oropharynx- clear Neck- supple, no JVP Lymph- no cervical lymphadenopathy Lungs- Clear to ausculation bilaterally, normal work of breathing Heart- regular rate and rhythm, no murmurs, rubs or gallops, PMI not laterally displaced GI- soft, NT, ND, + BS Extremities- no clubbing, cyanosis, or edema MS- no significant deformity or atrophy Skin- no rash or lesion Psych- euthymic mood, full affect Neuro- strength and sensation are intact  EKG- Sinus brady at 59 bpm, pr int 178 ms, qrs int 104, qtc 459 ms   Epic records reviewed   Assessment and Plan: 1. Afib/flutter with RVR Onset after MVR 05/09/20 Father has had 2 previous ablations  Was maintaining SR  for the last 3-4 weeks   Reoccurrence  of atrial flutter  06/07/20 Self converted after one week Now will reduce  amiodarone to 200 mg daily from bid  Continue Toprol xl 25 mg  bid    2. CHA2DS2VASc of at least 4   He had not  achieved therapeutic levels on warfarin since surgery  He has been transitioned over to xarelto 20 mg daily   3. MVR 05/09/20 Per Dr Roxy Manns Has f/u with him 4/11  F/u with Dr. Irish Lack 5/24 as scheduled afib clinic as needed   If  ERAF,  he may benefit from ablation   [place]. Libia Fazzini, [facility] [address] [place], [place] [address] [phone]

## 2020-06-25 ENCOUNTER — Other Ambulatory Visit: Payer: Self-pay | Admitting: Physician Assistant

## 2020-06-25 ENCOUNTER — Ambulatory Visit (HOSPITAL_COMMUNITY): Payer: BC Managed Care – PPO | Attending: Internal Medicine

## 2020-06-25 ENCOUNTER — Other Ambulatory Visit: Payer: Self-pay

## 2020-06-25 DIAGNOSIS — Z9889 Other specified postprocedural states: Secondary | ICD-10-CM | POA: Diagnosis not present

## 2020-06-25 DIAGNOSIS — I340 Nonrheumatic mitral (valve) insufficiency: Secondary | ICD-10-CM | POA: Insufficient documentation

## 2020-06-25 LAB — ECHOCARDIOGRAM COMPLETE
Area-P 1/2: 1.49 cm2
MV VTI: 1.65 cm2
S' Lateral: 3.37 cm

## 2020-06-27 ENCOUNTER — Encounter (HOSPITAL_COMMUNITY): Payer: Self-pay | Admitting: Nurse Practitioner

## 2020-06-27 NOTE — Addendum Note (Signed)
Encounter addended by: Sherran Needs, NP on: 06/27/2020 8:25 AM  Actions taken: Medication List reviewed, Problem List reviewed, Allergies reviewed, Level of Service modified

## 2020-06-30 ENCOUNTER — Other Ambulatory Visit: Payer: Self-pay | Admitting: Physician Assistant

## 2020-07-09 ENCOUNTER — Ambulatory Visit: Payer: Self-pay | Admitting: Thoracic Surgery (Cardiothoracic Vascular Surgery)

## 2020-07-10 ENCOUNTER — Other Ambulatory Visit: Payer: Self-pay | Admitting: Interventional Cardiology

## 2020-07-11 ENCOUNTER — Other Ambulatory Visit: Payer: Self-pay | Admitting: *Deleted

## 2020-07-11 MED ORDER — AMIODARONE HCL 200 MG PO TABS: 200.0000 mg | ORAL_TABLET | Freq: Every day | ORAL | 1 refills | Status: DC

## 2020-07-13 ENCOUNTER — Telehealth: Payer: Self-pay | Admitting: *Deleted

## 2020-07-13 ENCOUNTER — Inpatient Hospital Stay: Payer: BC Managed Care – PPO | Attending: Oncology | Admitting: Oncology

## 2020-07-13 ENCOUNTER — Inpatient Hospital Stay: Payer: BC Managed Care – PPO

## 2020-07-13 ENCOUNTER — Other Ambulatory Visit: Payer: Self-pay

## 2020-07-13 ENCOUNTER — Other Ambulatory Visit: Payer: BC Managed Care – PPO

## 2020-07-13 ENCOUNTER — Telehealth: Payer: Self-pay | Admitting: Oncology

## 2020-07-13 VITALS — BP 134/82 | HR 66 | Temp 98.0°F | Resp 18 | Ht 72.0 in | Wt 199.4 lb

## 2020-07-13 DIAGNOSIS — Z9221 Personal history of antineoplastic chemotherapy: Secondary | ICD-10-CM | POA: Diagnosis not present

## 2020-07-13 DIAGNOSIS — Z85038 Personal history of other malignant neoplasm of large intestine: Secondary | ICD-10-CM | POA: Insufficient documentation

## 2020-07-13 DIAGNOSIS — C186 Malignant neoplasm of descending colon: Secondary | ICD-10-CM

## 2020-07-13 DIAGNOSIS — G620 Drug-induced polyneuropathy: Secondary | ICD-10-CM | POA: Diagnosis not present

## 2020-07-13 LAB — CEA (IN HOUSE-CHCC): CEA (CHCC-In House): 2 ng/mL (ref 0.00–5.00)

## 2020-07-13 LAB — CEA (ACCESS): CEA (CHCC): 2.6 ng/mL (ref 0.00–5.00)

## 2020-07-13 NOTE — Progress Notes (Signed)
[facility] OFFICE PROGRESS NOTE   Diagnosis: Colon cancer  INTERVAL HISTORY:   [name] returns as scheduled.  He underwent a mitral valve repair on 05/09/2020.  He is returning to his normal activity level.  He is exercising.  Good appetite.  No difficulty with bowel function.  No bleeding.  No neuropathy symptoms.  Objective:  Vital signs in last 24 hours:  Blood pressure 134/82, pulse 66, temperature 98 F (36.7 C), temperature source Tympanic, resp. rate 18, height 6' (1.829 m), weight 199 lb 6.4 oz (90.4 kg), SpO2 96 %.    Lymphatics: No cervical, supraclavicular, axillary, or inguinal nodes Resp: Lungs clear bilaterally Cardio: Regular rate and rhythm GI: No hepatosplenomegaly, nontender, no mass Vascular: No leg edema  Skin: Multiple lipomas over the trunk and extremities   Lab Results:  Lab Results  Component Value Date   WBC 6.1 05/14/2020   HGB 11.2 (L) 05/14/2020   HCT 33.1 (L) 05/14/2020   MCV 95.9 05/14/2020   PLT 119 (L) 05/14/2020   NEUTROABS 9.5 (H) 04/05/2020    CMP  Lab Results  Component Value Date   NA 135 05/12/2020   K 3.8 05/12/2020   CL 103 05/12/2020   CO2 19 (L) 05/12/2020   GLUCOSE 174 (H) 05/12/2020   BUN 13 05/12/2020   CREATININE 1.06 05/12/2020   CALCIUM 8.7 (L) 05/12/2020   PROT 6.8 05/07/2020   ALBUMIN 3.9 05/07/2020   AST 30 05/07/2020   ALT 29 05/07/2020   ALKPHOS 43 05/07/2020   BILITOT 1.3 (H) 05/07/2020   GFRNONAA >60 05/12/2020   GFRAA 115 02/08/2020    Lab Results  Component Value Date   CEA1 1.17 01/09/2020     Medications: I have reviewed the patient's current medications.   Assessment/Plan:  1. 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 Mass felt to be in the transverse colon on a colonoscopy 01/07/2019 with a biopsy confirming poorly differentiated  adenocarcinoma with signet ring cell features, mismatch repair protein expression normal ? CT abdomen/pelvis 01/08/2019 12.3 cm indeterminate splenic mass, present in 2015 on a lumbar MRI-felt to be benign, small subpleural and pleural-based nodules at the lung bases, status post left nephrectomy ? CT chest 01/20/2019-small bilateral pulmonary nodules with rounded parenchymal nodules in the right lower lobe, indeterminate splenic mass ? CT chest 03/18/2019-multiple subcentimeter pulmonary nodules again seen bilaterally, greatest in the lower lobes, without significant change. No new or enlarging pulmonary nodules or masses. Partially visualized large heterogeneous splenic mass with no significant change. Plan for follow-up chest CT at a 55-monthinterval. ? Cycle 1 FOLFOX 03/21/2019 ? Cycle 2 FOLFOX 04/04/2019 ? Cycle 3 FOLFOX 04/18/2019 (oxaliplatin dose reduced secondary to thrombocytopenia) ? Cycle 4 FOLFOX 05/02/2019 (oxaliplatin held secondary to thrombocytopenia) ? Cycle 5 FOLFOX 05/16/2019 ? Cycle 6 FOLFOX 05/30/2019 (oxaliplatin held secondary to thrombocytopenia) ? CT chest 06/08/2019-stable scattered small solid pulmonary nodules ? Cycle 7 FOLFOX 06/13/2019 ? Cycle 8 FOLFOX 06/27/2019 ? Cycle 9 FOLFOX 07/11/2019 ? Cycle 10 FOLFOX 07/25/2019 (oxaliplatin held due to thrombocytopenia) ? Cycle 11 FOLFOX 08/08/2019 ? Cycle 12 FOLFOX 08/22/2019 ? CTs 01/09/2020-no evidence of recurrent disease, stable lung nodules favored to represent subpleural lymph nodes-dedicated follow-up not recommended  2. Multiple colon polyps-ascending colon polyps on the colonoscopy 01/07/2019-not removed  Colonoscopy 01/03/2020-multiple polyps removed, tubular adenomas, inflammatory and hyperplastic polyps 3. Wilms tumor at age 55 status post chemotherapy/radiation  and a nephrectomy in [place] 4. 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 Lumbar  discitis 2015 7. Diabetes 8. Asthma 9. Multiple lipomas 10. Family history of colon cancer  Invitaepanel 2020-POLE VUS 11.Left nephrectomy at age 19 12. Port-A-Cath placement, Dr. Johney Maine, 03/16/2019 13. 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  Minimally invasive mitral valve repair 05/09/2020     Disposition: [name] is in clinical remission from colon cancer.  We will follow up on the CEA from today.  He will return for an office visit and restaging CTs in 6 months.  He will continue colonoscopy surveillance with Dr. Michail Sermon.  Betsy Coder, MD  07/13/2020  8:07 AM

## 2020-07-13 NOTE — Telephone Encounter (Signed)
Notified patient that MD found colonoscopy report from 12/2019, so he will not be due till 2023 or 2034. Reminded him to pick up his oral contrast few days prior to his CT in October.

## 2020-07-13 NOTE — Telephone Encounter (Signed)
Scheduled appt per 4/15 los - pt is aware of appts.

## 2020-07-25 DIAGNOSIS — E7800 Pure hypercholesterolemia, unspecified: Secondary | ICD-10-CM | POA: Diagnosis not present

## 2020-07-25 DIAGNOSIS — I10 Essential (primary) hypertension: Secondary | ICD-10-CM | POA: Diagnosis not present

## 2020-07-25 DIAGNOSIS — E1169 Type 2 diabetes mellitus with other specified complication: Secondary | ICD-10-CM | POA: Diagnosis not present

## 2020-07-25 DIAGNOSIS — J45909 Unspecified asthma, uncomplicated: Secondary | ICD-10-CM | POA: Diagnosis not present

## 2020-07-25 DIAGNOSIS — Z7984 Long term (current) use of oral hypoglycemic drugs: Secondary | ICD-10-CM | POA: Diagnosis not present

## 2020-08-13 ENCOUNTER — Encounter: Payer: Self-pay | Admitting: Thoracic Surgery (Cardiothoracic Vascular Surgery)

## 2020-08-13 ENCOUNTER — Ambulatory Visit: Payer: BC Managed Care – PPO | Admitting: Thoracic Surgery (Cardiothoracic Vascular Surgery)

## 2020-08-13 ENCOUNTER — Other Ambulatory Visit: Payer: Self-pay

## 2020-08-13 VITALS — BP 149/89 | HR 68 | Resp 20 | Ht 72.0 in | Wt 197.0 lb

## 2020-08-13 DIAGNOSIS — Z9889 Other specified postprocedural states: Secondary | ICD-10-CM | POA: Diagnosis not present

## 2020-08-13 NOTE — Patient Instructions (Signed)
Continue all previous medications without any changes at this time  Discuss with Dr Irish Lack how much longer you should continue to take amiodarone and Xarelto  Endocarditis is a potentially serious infection of heart valves or inside lining of the heart.  It occurs more commonly in patients with diseased heart valves (such as patient's with aortic or mitral valve disease) and in patients who have undergone heart valve repair or replacement.  Certain surgical and dental procedures may put you at risk, such as dental cleaning, other dental procedures, or any surgery involving the respiratory, urinary, gastrointestinal tract, gallbladder or prostate gland.   To minimize your chances for develooping endocarditis, maintain good oral health and seek prompt medical attention for any infections involving the mouth, teeth, gums, skin or urinary tract.    Always notify your doctor or dentist about your underlying heart valve condition before having any invasive procedures. You will need to take antibiotics before certain procedures, including all routine dental cleanings or other dental procedures.  Your cardiologist or dentist should prescribe these antibiotics for you to be taken ahead of time.

## 2020-08-13 NOTE — Progress Notes (Signed)
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  The patient's early postoperative recovery was notable for the development of recurrent atrial flutter.  He was initially anticoagulated using warfarin and treated with amiodarone.  He was followed in the atrial fibrillation clinic and warfarin was later converted to Xarelto.  Eventually he converted back into sinus rhythm in March and he has remained in rhythm ever since.  He returns to the office today and reports that he is doing exceptionally well.  He states that he has essentially completely recovered from his surgery.  He is exercising regularly including pitching a baseball with his right arm, playing golf on a regular basis, and working out at a [employer].  He states that he feels great.  He no longer has any significant soreness in his chest.  He has not had any palpitations to suggest a recurrence of atrial fibrillation or atrial flutter.  He states that he only gets short of breath if he pushes himself physically.  Overall he is exceptionally pleased with his outcome.   Current Outpatient Medications  Medication Sig Dispense Refill  . albuterol (PROVENTIL HFA;VENTOLIN HFA) 108 (90 BASE) MCG/ACT inhaler Inhale 1 puff into the lungs every 6 (six) hours as needed for wheezing or shortness of breath.    Marland Kitchen amiodarone (PACERONE) 200  MG tablet Take 1 tablet (200 mg total) by mouth daily. 60 tablet 1  . aspirin EC 81 MG EC tablet Take 1 tablet (81 mg total) by mouth daily. Swallow whole. 30 tablet 11  . atorvastatin (LIPITOR) 10 MG tablet Take 1 tablet (10 mg total) by mouth daily. 30 tablet 1  . calcium carbonate (TUMS - DOSED IN MG ELEMENTAL CALCIUM) 500 MG chewable tablet Chew 500 mg by mouth daily as needed for indigestion or heartburn.     . fluticasone (FLONASE) 50 MCG/ACT nasal spray Place 1 spray into both nostrils daily as needed for allergies or rhinitis.    Marland Kitchen loratadine (CLARITIN) 10 MG tablet Take 10 mg by mouth daily as needed for allergies.    . metFORMIN (GLUCOPHAGE) 1000 MG tablet Take 1 tablet (1,000 mg total) by mouth 2 (two) times daily.    . mometasone (ASMANEX) 220 MCG/INH inhaler Inhale 1 puff into the lungs daily.    . ONE TOUCH ULTRA TEST test strip 1 each by Other route as needed (GLUCOSE).     Marland Kitchen pioglitazone (ACTOS) 15 MG tablet Take 15 mg by mouth daily.    . repaglinide (PRANDIN) 2 MG tablet Take 2 mg by mouth 2 (two) times daily.    . rivaroxaban (XARELTO) 20 MG TABS tablet Take 1 tablet (20 mg total) by mouth daily with supper. 90 tablet 1  . TOPROL XL 25 MG 24 hr tablet Take 1 tablet (25 mg total) by mouth 2 (two) times daily.  No current facility-administered medications for this visit.      Physical Exam:   BP (!) 149/89 (BP Location: Right Arm, Patient Position: Sitting)   Pulse 68   Resp 20   Ht 6' (1.829 m)   Wt 197 lb (89.4 kg)   SpO2 97% Comment: RA  BMI 26.72 kg/m   General:  Well-appearing  Chest:   Clear to auscultation  CV:   Regular rate and rhythm without murmur  Incisions:  Well-healed  Abdomen:  Soft nontender  Extremities:  Warm and well-perfused  Diagnostic Tests:   ECHOCARDIOGRAM REPORT       Patient Name:  [name] Date of Exam: 06/25/2020  Medical Rec #: [id number]    Height:    72.0 in  Accession #:  [id number]    Weight:     195.6 lb  Date of Birth: July 01, 1965    BSA:     2.111 m  Patient Age:  72 years     BP:      159/75 mmHg  Patient Gender: M        HR:      69 bpm.  Exam Location: [address]   Procedure: 2D Echo, 3D Echo, Cardiac Doppler and Color Doppler   Indications:  I05.9 Mitral Valve Disorder         Z98.890 Status Post Mitral Valve Repair    History:    Patient has prior history of Echocardiogram examinations,  most         recent 05/09/2020. Mitral Valve Disease, Arrythmias:Atrial         Fibrillation; Risk Factors:Family History of Coronary  Artery         Disease, Hypertension, Diabetes and Dyslipidemia. History  of         Mitral Valve Endocarditis (2015, causing partial flail  leaflet         and MR), status post Mitral Valve Repair (05-09-2020, 40mm  Sorin         Memo 4D Ring), Colon Cancer with Chemotherapy.    Sonographer:  Deliah Boston RDCS  Referring Phys: Pope    1. Left ventricular ejection fraction, by estimation, is 55 to 60%. The  left ventricle has normal function. The left ventricle has no regional  wall motion abnormalities. Left ventricular diastolic parameters are  consistent with Grade II diastolic  dysfunction (pseudonormalization). Elevated left atrial pressure.  2. Right ventricular systolic function is low normal. The right  ventricular size is mildly enlarged. There is normal pulmonary artery  systolic pressure.  3. Left atrial size was moderately dilated.  4. Right atrial size was mildly dilated.  5. S/p MV valvuloplasty with chord placement and 28 mm Sorin Memo 4D  annuloplasty ring (05/09/20). Peak and men gradients through the valve are  11 and 6 mm Hg respectively. MVA (VTI) is 1.7 cm2. Trivial mitral valve  regurgitation.  6. The aortic valve is tricuspid. Aortic valve regurgitation is not   visualized. Mild aortic valve sclerosis is present, with no evidence of  aortic valve stenosis.  7. Aortic dilatation noted. There is mild dilatation of the ascending  aorta, measuring 41 mm.  8. The inferior vena cava is normal in size with greater than 50%  respiratory variability, suggesting right atrial pressure of 3 mmHg.   FINDINGS  Left Ventricle: Left ventricular ejection fraction, by estimation, is 55  to 60%. The left ventricle has normal function. The left ventricle has no  regional wall motion abnormalities. The left ventricular internal cavity  size was normal in size. There is  no left ventricular hypertrophy. Left ventricular diastolic parameters  are consistent with Grade II diastolic dysfunction (pseudonormalization).  Elevated left atrial pressure.   Right Ventricle: The right ventricular size is mildly enlarged. Right  vetricular wall thickness was not assessed. Right ventricular systolic  function is low normal. There is normal pulmonary artery systolic  pressure. The tricuspid regurgitant velocity is  2.44 m/s, and with an assumed right atrial pressure of 3 mmHg, the  estimated right ventricular systolic pressure is 0000000 mmHg.   Left Atrium: Left atrial size was moderately dilated.   Right Atrium: Right atrial size was mildly dilated.   Pericardium: There is no evidence of pericardial effusion.   Mitral Valve: S/p MV valvuloplasty with chord placement and 28 mm Sorin  Memo 4D annuloplasty ring (05/09/20). Peak and men gradients through the  valve are 11 and 6 mm Hg respectively. MVA (P1/2) is 1.7 cm2. The mitral  valve has been repaired/replaced.  There is mild thickening of the mitral valve leaflet(s). There is mild  calcification of the mitral valve leaflet(s). Trivial mitral valve  regurgitation. MV peak gradient, 17.1 mmHg. The mean mitral valve gradient  is 5.0 mmHg.   Tricuspid Valve: The tricuspid valve is normal in structure. Tricuspid  valve  regurgitation is mild.   Aortic Valve: The aortic valve is tricuspid. Aortic valve regurgitation is  not visualized. Mild aortic valve sclerosis is present, with no evidence  of aortic valve stenosis.   Pulmonic Valve: The pulmonic valve was grossly normal. Pulmonic valve  regurgitation is mild.   Aorta: Aortic dilatation noted. There is mild dilatation of the ascending  aorta, measuring 41 mm.   Venous: The inferior vena cava is normal in size with greater than 50%  respiratory variability, suggesting right atrial pressure of 3 mmHg.   IAS/Shunts: No atrial level shunt detected by color flow Doppler.     LEFT VENTRICLE  PLAX 2D  LVIDd:     4.85 cm Diastology  LVIDs:     3.37 cm LV e' medial:  6.31 cm/s  LV PW:     1.00 cm LV E/e' medial: 25.7  LV IVS:    0.75 cm LV e' lateral:  11.30 cm/s  LVOT diam:   2.40 cm LV E/e' lateral: 14.3  LV SV:     111  LV SV Index:  52  LVOT Area:   4.52 cm               3D Volume EF:             3D EF:    63 %             LV EDV:    172 ml             LV ESV:    64 ml             LV SV:    108 ml   RIGHT VENTRICLE  RV S prime:   11.90 cm/s  TAPSE (M-mode): 1.8 cm   LEFT ATRIUM       Index    RIGHT ATRIUM      Index  LA diam:    4.30 cm 2.04 cm/m RA Area:   22.10 cm  LA Vol (A2C):  128.0 ml 60.65 ml/m RA Volume:  65.20 ml 30.89 ml/m  LA Vol (A4C):  97.9 ml  46.39 ml/m  LA Biplane Vol: 113.0 ml 53.54 ml/m  AORTIC VALVE  LVOT Vmax:  108.70 cm/s  LVOT Vmean: 74.200 cm/s  LVOT VTI:  0.245 m    AORTA  Ao Root diam: 3.30 cm  Ao Asc diam: 4.05 cm   MITRAL VALVE        TRICUSPID VALVE  MV Area (PHT): 1.48 cm   TR Peak grad:  23.8 mmHg  MV Area VTI:  1.65 cm   TR Vmax:    244.00 cm/s  MV Peak grad: 17.1 mmHg  MV Mean grad: 5.0 mmHg   SHUNTS  MV Vmax:     2.07 m/s   Systemic VTI: 0.24 m  MV Vmean:   105.0 cm/s  Systemic Diam: 2.40 cm  MV Decel Time: 509 msec  MV E velocity: 162.00 cm/s  MV A velocity: 117.00 cm/s  MV E/A ratio: 1.38   Dorris Carnes MD  Electronically signed by Dorris Carnes MD  Signature Date/Time: 06/25/2020/3:50:24 PM      Impression:  Patient is doing very well more than 3 months status post mitral valve repair.  I have personally reviewed the patient's recent follow-up echocardiogram which reveals normal left ventricular function, intact mitral valve repair, and trivial mitral regurgitation.    Plan:  We have not recommended any changes to the patient's current medications.  I think it would be reasonable to consider tapering the patient off of amiodarone in the near future and stopping Xarelto at a later date as long as he continues to maintain sinus rhythm.  However, we will defer the timing and decision making to Dr. Irish Lack.  I have encouraged the patient to continue to increase his physical activity without any particular limitations.  All of his questions have been addressed.  In the future the patient will call this office only should specific problems or questions arise.  I spent in excess of 15 minutes during the conduct of this office consultation and >50% of this time involved direct face-to-face encounter with the patient for counseling and/or coordination of their care.    Valentina Gu. Roxy Manns, MD 08/13/2020 4:55 PM

## 2020-08-19 NOTE — Progress Notes (Signed)
Cardiology Office Note   Date:  08/21/2020   ID:  David, [name] 1965/12/10, MRN [id number]  PCP:  Orpah Melter, MD    No chief complaint on file.  S/p mitral valve repair  Wt Readings from Last 3 Encounters:  08/21/20 204 lb 9.6 oz (92.8 kg)  08/13/20 197 lb (89.4 kg)  07/13/20 199 lb 6.4 oz (90.4 kg)       History of Present Illness: [name] is a 55 y.o. male  with a h/o Wilms tumor status post nephrectomy, hypertension, lipoma, mitral bacterial valve endocarditis 6568 which was complicated by mitral valve regurgitation, had cerebral emboli from endocarditis which was followed by a 6-week course of antibiotics to treat his Enterococcus bloodstream infection. PMH also includes HTN, hyperlipidemia stage IIIc adenocarcinoma of the colon status post left colectomy 12/20 status post chemotherapy, and type 2 diabetes.  TEE 02/10/2020 showed severe mitral valve regurgitation with flail segment involving portions of the posterior leaflet. EF 60-65%. Diagnostic cardiac cath showed normal coronary arteries with no significant coronary artery disease. PA pressures were normal. He presented for surgery and was taken to the operating room 05/09/2020. He received minimally invasive mitral valve repair. He was atrially paced. Anticoagulation with Coumadin was initiated postop day 1. He was noted to have a rhythm change that was atrial fibrillation with RVR versus accelerated junctional 05/12/20. He was placed on Lopressor and amiodarone. He was discharged in stable condition on 05/14/2020.  He tracks his HR by his Apple watch.  Converted from Coumadin to Xarelto in 3/22.  Also converted to NSR around that time, prior to cardioversion.  Echo in 3/22 showed: "Left ventricular ejection fraction, by estimation, is 55 to 60%. The  left ventricle has normal function. The left ventricle has no regional  wall motion abnormalities. Left ventricular diastolic parameters are   consistent with Grade II diastolic  dysfunction (pseudonormalization). Elevated left atrial pressure.  2. Right ventricular systolic function is low normal. The right  ventricular size is mildly enlarged. There is normal pulmonary artery  systolic pressure.  3. Left atrial size was moderately dilated.  4. Right atrial size was mildly dilated.  5. S/p MV valvuloplasty with chord placement and 28 mm Sorin Memo 4D  annuloplasty ring (05/09/20). Peak and men gradients through the valve are  11 and 6 mm Hg respectively. MVA (VTI) is 1.7 cm2. Trivial mitral valve  regurgitation.  6. The aortic valve is tricuspid. Aortic valve regurgitation is not  visualized. Mild aortic valve sclerosis is present, with no evidence of  aortic valve stenosis.  7. Aortic dilatation noted. There is mild dilatation of the ascending  aorta, measuring 41 mm.  8. The inferior vena cava is normal in size with greater than 50%  respiratory variability, suggesting right atrial pressure of 3 mmHg. "  Denies : Chest pain. Dizziness. Leg edema. Nitroglycerin use. Orthopnea. Palpitations. Paroxysmal nocturnal dyspnea. Shortness of breath. Syncope.  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  He has noted weight gain.  He is trying to minimize red meat.  He is also following up regularly given his history of colon cancer.   Past Medical History:  Diagnosis Date  . Allergic rhinitis   . Asthma    animal dander & seasonal asthma  . Colon cancer ([facility]) 01/26/2019  . Diabetes mellitus without complication ([facility])    dx 2010  type 2  . Discitis of lumbosacral region    first started with this .Marland Kitchen  and rolled onto the endocarditis    stayed a month  . 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 Family history of colon cancer   . Family history of thyroid cancer   . H/O scoliosis   . 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 History of hiatal hernia   . Hyperlipidemia   .  Hypertension    Diagnostic exercise tolerance test assessment:04/30/2010 : comments normal -no evidence os ischemia by ST analysis  . lipoma   . Lipoma 01/26/2019  . Mitral regurgitation    Echo 12/2018: EF 60-65, myxomatous mitral valve, severe mitral regurgitation, partial flail leaflet of posterior mitral valve, myxomatous tricuspid valve with trivial TR, ascending aorta mildly dilated (39 mm)  . Pneumonia    as child  . PONV (postoperative nausea and vomiting)   . 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   Complex valvuloplasty including artificial Gore-tex neochord placement x6 with 28 mm Sorin Memo 4D ring annuloplasty via right mini thoracotomy approach  . Solitary kidney   . 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 ABDOMINAL AORTOGRAM N/A 02/10/2020   Procedure: ABDOMINAL AORTOGRAM;  Surgeon: Jettie Booze, MD;  Location: [facility];  Service: Cardiovascular;  Laterality: N/A;  . BIOPSY  01/07/2019   Procedure: BIOPSY;  Surgeon: Wilford Corner, MD;  Location: [facility];  Service: Endoscopy;;  . BIOPSY  01/03/2020   Procedure: BIOPSY;  Surgeon: Wilford Corner, MD;  Location: [facility];  Service: Endoscopy;;  . COLON SURGERY  03/04/2019   left colon segmental resection  . COLONOSCOPY WITH PROPOFOL N/A 01/07/2019   Procedure: COLONOSCOPY WITH PROPOFOL;  Surgeon: Wilford Corner, MD;  Location: [facility];  Service: Endoscopy;  Laterality: N/A;  . COLONOSCOPY WITH PROPOFOL N/A 01/03/2020   Procedure: COLONOSCOPY WITH PROPOFOL;  Surgeon: Wilford Corner, MD;  Location: [facility];  Service: Endoscopy;  Laterality: N/A;  . INGUINAL HERNIA REPAIR Left 04/23/2016   Procedure: LAPAROSCOPIC  INGUINAL REPAIR;  Surgeon: Clovis Riley, MD;  Location: [facility];  Service: General;  Laterality: Left;  . KNEE SURGERY     arthroscopic left knee  . LIPOMA EXCISION  2015    x20 [facility]  . MITRAL VALVE REPAIR Right 05/09/2020   Procedure: MINIMALLY INVASIVE MITRAL VALVE REPAIR (MVR) USING LIVANOVA MEMO 4D 28MM MITRAL RING;  Surgeon: [name], MD;  Location: [facility];  Service: Open Heart Surgery;  Laterality: Right;  . POLYPECTOMY  01/03/2020   Procedure: POLYPECTOMY;  Surgeon: Wilford Corner, MD;  Location: [facility];  Service: Endoscopy;;  . PORT-A-CATH REMOVAL    . PORTACATH PLACEMENT N/A 03/16/2019   Procedure: INSERTION PORT-A-CATH;  Surgeon: Michael Boston, MD;  Location: [facility];  Service: General;  Laterality: N/A;  . RIGHT/LEFT HEART CATH AND CORONARY ANGIOGRAPHY N/A 02/10/2020   Procedure: RIGHT/LEFT HEART CATH AND CORONARY ANGIOGRAPHY;  Surgeon: Jettie Booze, MD;  Location: [facility];  Service: Cardiovascular;  Laterality: N/A;  . SUBMUCOSAL INJECTION  01/07/2019   Procedure: SUBMUCOSAL INJECTION;  Surgeon: Wilford Corner, MD;  Location: [facility];  Service: Endoscopy;;  . TEE WITHOUT CARDIOVERSION N/A 02/10/2020   Procedure: TRANSESOPHAGEAL ECHOCARDIOGRAM (TEE);  Surgeon: Acie Fredrickson Wonda Cheng, MD;  Location: [facility];  Service: Cardiovascular;  Laterality: N/A;  CATH AFTER TEE  . TEE WITHOUT CARDIOVERSION N/A 05/09/2020   Procedure: TRANSESOPHAGEAL ECHOCARDIOGRAM (TEE);  Surgeon: Roxy Manns,  Valentina Gu, MD;  Location: [facility];  Service: Open Heart Surgery;  Laterality: N/A;  . THYROIDECTOMY, PARTIAL  01/05/2009   Right Thyroid Lobectomy  . TONSILLECTOMY    . TOTAL NEPHRECTOMY Left 1970   Wilms Tumor left kidney excised as an infant     Current Outpatient Medications  Medication Sig Dispense Refill  . albuterol (PROVENTIL HFA;VENTOLIN HFA) 108 (90 BASE) MCG/ACT inhaler Inhale 1 puff into the lungs every 6 (six) hours as needed for wheezing or shortness of breath.    Marland Kitchen amiodarone (PACERONE) 200 MG tablet Take 1 tablet (200 mg total) by mouth daily. 60 tablet 1  . aspirin EC 81 MG EC tablet Take 1 tablet (81 mg total) by mouth  daily. Swallow whole. 30 tablet 11  . atorvastatin (LIPITOR) 10 MG tablet Take 1 tablet (10 mg total) by mouth daily. 30 tablet 1  . calcium carbonate (TUMS - DOSED IN MG ELEMENTAL CALCIUM) 500 MG chewable tablet Chew 500 mg by mouth daily as needed for indigestion or heartburn.     . fluticasone (FLONASE) 50 MCG/ACT nasal spray Place 1 spray into both nostrils daily as needed for allergies or rhinitis.    Marland Kitchen loratadine (CLARITIN) 10 MG tablet Take 10 mg by mouth daily as needed for allergies.    . metFORMIN (GLUCOPHAGE) 1000 MG tablet Take 1 tablet (1,000 mg total) by mouth 2 (two) times daily.    . mometasone (ASMANEX) 220 MCG/INH inhaler Inhale 1 puff into the lungs daily.    . ONE TOUCH ULTRA TEST test strip 1 each by Other route as needed (GLUCOSE).     Marland Kitchen pioglitazone (ACTOS) 30 MG tablet Take 1 tablet by mouth daily.    . repaglinide (PRANDIN) 2 MG tablet Take 2 mg by mouth 2 (two) times daily.    . rivaroxaban (XARELTO) 20 MG TABS tablet Take 1 tablet (20 mg total) by mouth daily with supper. 90 tablet 1  . TOPROL XL 25 MG 24 hr tablet Take 1 tablet (25 mg total) by mouth 2 (two) times daily.     No current facility-administered medications for this visit.    Allergies:   Empagliflozin    Social History:  The patient  reports that he has never smoked. He has never used smokeless tobacco. He reports current alcohol use of about 1.0 standard drink of alcohol per week. He reports that he does not use drugs.   Family History:  The patient's family history includes Cancer in his mother; Colon cancer in an other family member; Colon cancer (age of onset: 58) in his maternal aunt; Colon cancer (age of onset: 56) in an other family member; Congestive Heart Failure in his maternal grandfather, maternal grandmother, and paternal uncle; Healthy in his sister; Heart attack in his maternal grandfather and paternal grandfather; Heart disease in his father; Hernia in his father; Hypotension in his  mother; Stroke in his paternal grandmother.    ROS:  Please see the history of present illness.   Otherwise, review of systems are positive for decreased exercise tolerance.   All other systems are reviewed and negative.    PHYSICAL EXAM: VS:  BP 120/86   Pulse 66   Ht 6' (1.829 m)   Wt 204 lb 9.6 oz (92.8 kg)   BMI 27.75 kg/m  , BMI Body mass index is 27.75 kg/m. GEN: Well nourished, well developed, in no acute distress  HEENT: normal  Neck: no JVD, carotid bruits, or masses Cardiac: RRR; no  murmurs, rubs, or gallops,no edema  Respiratory:  clear to auscultation bilaterally, normal work of breathing GI: soft, nontender, nondistended, + BS MS: no deformity or atrophy  Skin: warm and dry, no rash; superficial scratch on the upper incision site Neuro:  Strength and sensation are intact Psych: euthymic mood, full affect   EKG:   The ekg ordered today demonstrates NSR, no ST changes   Recent Labs: 05/07/2020: ALT 29 05/12/2020: BUN 13; Creatinine, Ser 1.06; Magnesium 1.5; Potassium 3.8; Sodium 135 05/14/2020: Hemoglobin 11.2; Platelets 119   Lipid Panel    Component Value Date/Time   CHOL 116 03/05/2014 0630   TRIG 122 03/05/2014 0630   HDL 21 (L) 03/05/2014 0630   CHOLHDL 5.5 03/05/2014 0630   VLDL 24 03/05/2014 0630   LDLCALC 71 03/05/2014 0630     Other studies Reviewed: Additional studies/ records that were reviewed today with results demonstrating: echo results reviewed.     ASSESSMENT AND PLAN:  1. S/p MV repair: SBE prophylaxis.  No CHF sx. 3/22 echo results reviewed.   2. AFib/AFlutter: Consideration was to be nade for ablation if he had ERAF.  Was on Amio 200 mg daily as of 06/22/20.  Stop amiodarone.  Use Apple watch to see if he has AFib recurrence.  If he goes a few months without any problems, plan to stop Xarelto.   3. Hyperlipidemia: No CAD at the time of cath.  4. DM: Increase fiber intake.  A1C 8.0. Increase exercise. Try to eat more plant based diet.   Avoid processed foods.    Current medicines are reviewed at length with the patient today.  The patient concerns regarding his medicines were addressed.  The following changes have been made:  No change  Labs/ tests ordered today include:  No orders of the defined types were placed in this encounter.   Recommend 150 minutes/week of aerobic exercise Low fat, low carb, high fiber diet recommended  Disposition:   FU in 4 months   Signed, Larae Grooms, MD  08/21/2020 8:15 AM    [facility] [place], [place], [place]  [address] Phone: [phone]; Fax: [phone]

## 2020-08-21 ENCOUNTER — Other Ambulatory Visit: Payer: Self-pay

## 2020-08-21 ENCOUNTER — Ambulatory Visit: Payer: BC Managed Care – PPO | Admitting: Interventional Cardiology

## 2020-08-21 ENCOUNTER — Encounter: Payer: Self-pay | Admitting: Interventional Cardiology

## 2020-08-21 VITALS — BP 120/86 | HR 66 | Ht 72.0 in | Wt 204.6 lb

## 2020-08-21 DIAGNOSIS — I10 Essential (primary) hypertension: Secondary | ICD-10-CM | POA: Diagnosis not present

## 2020-08-21 DIAGNOSIS — I480 Paroxysmal atrial fibrillation: Secondary | ICD-10-CM | POA: Diagnosis not present

## 2020-08-21 DIAGNOSIS — Z9889 Other specified postprocedural states: Secondary | ICD-10-CM

## 2020-08-21 DIAGNOSIS — E782 Mixed hyperlipidemia: Secondary | ICD-10-CM

## 2020-08-21 NOTE — Patient Instructions (Signed)
Medication Instructions:  Your physician has recommended you make the following change in your medication: Stop amiodarone  *If you need a refill on your cardiac medications before your next appointment, please call your pharmacy*   Lab Work: none If you have labs (blood work) drawn today and your tests are completely normal, you will receive your results only by: Marland Kitchen MyChart Message (if you have MyChart) OR . A paper copy in the mail If you have any lab test that is abnormal or we need to change your treatment, we will call you to review the results.   Testing/Procedures: none   Follow-Up: At [facility], you and your health needs are our priority.  As part of our continuing mission to provide you with exceptional heart care, we have created designated Provider Care Teams.  These Care Teams include your primary Cardiologist (physician) and Advanced Practice Providers (APPs -  Physician Assistants and Nurse Practitioners) who all work together to provide you with the care you need, when you need it.  We recommend signing up for the patient portal called "MyChart".  Sign up information is provided on this After Visit Summary.  MyChart is used to connect with patients for Virtual Visits (Telemedicine).  Patients are able to view lab/test results, encounter notes, upcoming appointments, etc.  Non-urgent messages can be sent to your provider as well.   To learn more about what you can do with MyChart, go to [email or web address].    Your next appointment:   4 month(s)  The format for your next appointment:   In Person  Provider:   You may see Larae Grooms, MD or one of the following Advanced Practice Providers on your designated Care Team:    Melina Copa, PA-C  Ermalinda Barrios, PA-C    Other Instructions Send [place] an update in July on how you are doing

## 2020-09-06 ENCOUNTER — Other Ambulatory Visit (HOSPITAL_COMMUNITY): Payer: Self-pay

## 2020-09-06 MED ORDER — TOPROL XL 25 MG PO TB24: 25.0000 mg | ORAL_TABLET | Freq: Two times a day (BID) | ORAL | 3 refills | Status: DC

## 2020-09-12 DIAGNOSIS — Z20822 Contact with and (suspected) exposure to covid-19: Secondary | ICD-10-CM | POA: Diagnosis not present

## 2020-10-08 DIAGNOSIS — L738 Other specified follicular disorders: Secondary | ICD-10-CM | POA: Diagnosis not present

## 2020-10-08 DIAGNOSIS — L814 Other melanin hyperpigmentation: Secondary | ICD-10-CM | POA: Diagnosis not present

## 2020-10-08 DIAGNOSIS — D229 Melanocytic nevi, unspecified: Secondary | ICD-10-CM | POA: Diagnosis not present

## 2020-10-08 DIAGNOSIS — L570 Actinic keratosis: Secondary | ICD-10-CM | POA: Diagnosis not present

## 2020-10-08 DIAGNOSIS — X32XXXS Exposure to sunlight, sequela: Secondary | ICD-10-CM | POA: Diagnosis not present

## 2020-10-10 ENCOUNTER — Other Ambulatory Visit: Payer: Self-pay | Admitting: *Deleted

## 2020-10-10 DIAGNOSIS — Z5181 Encounter for therapeutic drug level monitoring: Secondary | ICD-10-CM

## 2020-10-10 DIAGNOSIS — Z9889 Other specified postprocedural states: Secondary | ICD-10-CM

## 2020-10-10 DIAGNOSIS — I4891 Unspecified atrial fibrillation: Secondary | ICD-10-CM

## 2020-10-10 MED ORDER — RIVAROXABAN 20 MG PO TABS: 20.0000 mg | ORAL_TABLET | Freq: Every day | ORAL | 1 refills | Status: DC

## 2020-10-10 NOTE — Telephone Encounter (Signed)
Xarelto 20mg  paper refill request received. Pt is 55 years old, weight-92.8kg, Crea-1.06 on 05/12/2020, last seen by Dr. Irish Lack on 08/21/2020, Diagnosis-Afib, CrCl-105.6ml/min; Dose is appropriate based on dosing criteria. Will send in refill to requested pharmacy.

## 2020-11-05 MED ORDER — TOPROL XL 25 MG PO TB24: 25.0000 mg | ORAL_TABLET | Freq: Every day | ORAL | 3 refills | Status: DC

## 2020-11-30 ENCOUNTER — Other Ambulatory Visit (HOSPITAL_COMMUNITY): Payer: Self-pay | Admitting: Nurse Practitioner

## 2021-01-08 DIAGNOSIS — R972 Elevated prostate specific antigen [PSA]: Secondary | ICD-10-CM | POA: Diagnosis not present

## 2021-01-11 ENCOUNTER — Other Ambulatory Visit: Payer: Self-pay | Admitting: Oncology

## 2021-01-11 ENCOUNTER — Other Ambulatory Visit: Payer: Self-pay

## 2021-01-11 ENCOUNTER — Inpatient Hospital Stay: Payer: BC Managed Care – PPO | Attending: Oncology

## 2021-01-11 ENCOUNTER — Ambulatory Visit (HOSPITAL_BASED_OUTPATIENT_CLINIC_OR_DEPARTMENT_OTHER)
Admission: RE | Admit: 2021-01-11 | Discharge: 2021-01-11 | Disposition: A | Discharge status: Home / Self Care | Payer: BC Managed Care – PPO | Source: Ambulatory Visit | Attending: Oncology | Admitting: Oncology

## 2021-01-11 DIAGNOSIS — Z9221 Personal history of antineoplastic chemotherapy: Secondary | ICD-10-CM | POA: Diagnosis not present

## 2021-01-11 DIAGNOSIS — C189 Malignant neoplasm of colon, unspecified: Secondary | ICD-10-CM | POA: Diagnosis not present

## 2021-01-11 DIAGNOSIS — Z85038 Personal history of other malignant neoplasm of large intestine: Secondary | ICD-10-CM | POA: Insufficient documentation

## 2021-01-11 DIAGNOSIS — C186 Malignant neoplasm of descending colon: Secondary | ICD-10-CM

## 2021-01-11 DIAGNOSIS — R918 Other nonspecific abnormal finding of lung field: Secondary | ICD-10-CM | POA: Diagnosis not present

## 2021-01-11 DIAGNOSIS — Z923 Personal history of irradiation: Secondary | ICD-10-CM | POA: Diagnosis not present

## 2021-01-11 LAB — BASIC METABOLIC PANEL - CANCER CENTER ONLY
Anion gap: 8 (ref 5–15)
BUN: 18 mg/dL (ref 6–20)
CO2: 28 mmol/L (ref 22–32)
Calcium: 9.2 mg/dL (ref 8.9–10.3)
Chloride: 101 mmol/L (ref 98–111)
Creatinine: 0.96 mg/dL (ref 0.61–1.24)
GFR, Estimated: >60 mL/min (ref >60)
Glucose, Bld: 196 mg/dL — ABNORMAL HIGH (ref 70–99)
Potassium: 4.6 mmol/L (ref 3.5–5.1)
Sodium: 137 mmol/L (ref 135–145)

## 2021-01-11 LAB — CEA (ACCESS): CEA (CHCC): 1.12 ng/mL (ref 0.00–5.00)

## 2021-01-11 MED ORDER — IOHEXOL 300 MG/ML  SOLN
100.0000 mL | Freq: Once | INTRAMUSCULAR | Status: AC | PRN
  Administered 2021-01-11: 100 mL via INTRAVENOUS

## 2021-01-14 ENCOUNTER — Inpatient Hospital Stay: Payer: BC Managed Care – PPO | Admitting: Oncology

## 2021-01-14 ENCOUNTER — Other Ambulatory Visit: Payer: Self-pay

## 2021-01-14 VITALS — BP 129/86 | HR 92 | Temp 97.8°F | Resp 18 | Ht 72.0 in | Wt 210.0 lb

## 2021-01-14 DIAGNOSIS — Z85038 Personal history of other malignant neoplasm of large intestine: Secondary | ICD-10-CM | POA: Diagnosis not present

## 2021-01-14 DIAGNOSIS — C186 Malignant neoplasm of descending colon: Secondary | ICD-10-CM

## 2021-01-14 DIAGNOSIS — Z9221 Personal history of antineoplastic chemotherapy: Secondary | ICD-10-CM | POA: Diagnosis not present

## 2021-01-14 DIAGNOSIS — Z923 Personal history of irradiation: Secondary | ICD-10-CM | POA: Diagnosis not present

## 2021-01-14 NOTE — Progress Notes (Signed)
[place] OFFICE PROGRESS NOTE   Diagnosis: Colon cancer  INTERVAL HISTORY:   [name] returns as scheduled.  He feels well.  No neuropathy symptoms.  He is working and exercising.  No new complaint.  Objective:  Vital signs in last 24 hours:  Blood pressure 129/86, pulse 92, temperature 97.8 F (36.6 C), temperature source Oral, resp. rate 18, height 6' (1.829 m), weight 210 lb (95.3 kg), SpO2 100 %.    Lymphatics: No cervical, supraclavicular, axillary, or inguinal nodes Resp: Lungs clear bilaterally Cardio: Regular rate and rhythm GI: No hepatosplenomegaly Vascular: No leg edema  Skin: Multiple lipomas over the trunk  Lab Results:  Lab Results  Component Value Date   WBC 6.1 05/14/2020   HGB 11.2 (L) 05/14/2020   HCT 33.1 (L) 05/14/2020   MCV 95.9 05/14/2020   PLT 119 (L) 05/14/2020   NEUTROABS 9.5 (H) 04/05/2020    CMP  Lab Results  Component Value Date   NA 137 01/11/2021   K 4.6 01/11/2021   CL 101 01/11/2021   CO2 28 01/11/2021   GLUCOSE 196 (H) 01/11/2021   BUN 18 01/11/2021   CREATININE 0.96 01/11/2021   CALCIUM 9.2 01/11/2021   PROT 6.8 05/07/2020   ALBUMIN 3.9 05/07/2020   AST 30 05/07/2020   ALT 29 05/07/2020   ALKPHOS 43 05/07/2020   BILITOT 1.3 (H) 05/07/2020   GFRNONAA >60 01/11/2021   GFRAA 115 02/08/2020    Lab Results  Component Value Date   CEA1 2.00 07/13/2020   CEA 1.12 01/11/2021    Lab Results  Component Value Date   INR 2.4 06/14/2020   LABPROT 15.9 (H) 05/14/2020    Imaging:  CT CHEST ABDOMEN PELVIS W CONTRAST  Result Date: 01/13/2021 CLINICAL DATA:  Follow-up colon carcinoma.  Surveillance. EXAM: CT CHEST, ABDOMEN, AND PELVIS WITH CONTRAST TECHNIQUE: Multidetector CT imaging of the chest, abdomen and pelvis was performed following the standard protocol during bolus administration of intravenous contrast. CONTRAST:  13m OMNIPAQUE IOHEXOL 300 MG/ML  SOLN COMPARISON:  01/09/2020 FINDINGS: CT CHEST  FINDINGS Cardiovascular: No acute findings. Aortic atherosclerotic calcification noted. Prosthetic mitral valve noted. Mediastinum/Lymph Nodes: No masses or pathologically enlarged lymph nodes identified. Prior right thyroidectomy again noted. Lungs/Pleura: Multiple bilateral sub-centimeter pulmonary nodules are again seen, without significant change. Increased scarring is seen in the right upper lobe with stable right upper lobe bronchiectasis. No evidence of airspace disease or pleural effusion. Musculoskeletal:  No suspicious bone lesions identified. CT ABDOMEN AND PELVIS FINDINGS Hepatobiliary: No masses identified. A few tiny sub-cm low-attenuation lesions remains stable, consistent with tiny cysts. Gallbladder is unremarkable. No evidence of biliary ductal dilatation. Pancreas:  No mass or inflammatory changes. Spleen: Heterogeneous low-attenuation splenic mass measuring 12 cm in maximum diameter shows no significant change. Adrenals/Urinary tract: Stable postop changes from prior left nephrectomy. Tiny sub-cm right lower pole renal cyst is noted, however there is no evidence of right renal mass or hydronephrosis. Stomach/Bowel: Stable postop changes from partial left colectomy. No evidence of obstruction, inflammatory process, or abnormal fluid collections. Normal appendix visualized. Vascular/Lymphatic: No pathologically enlarged lymph nodes identified. No acute vascular findings. Aortic atherosclerotic calcification noted. Reproductive:  No mass or other significant abnormality identified. Other:  None. Musculoskeletal: No suspicious bone lesions identified. Several small sclerotic bone lesions in the sacrum and pelvis remain stable. IMPRESSION: Stable exam.  No evidence of recurrent or metastatic carcinoma. Stable large splenic mass, consistent with benign etiology. Electronically Signed   By: JLinus Mako  Kris Hartmann M.D.   On: 01/13/2021 17:16    Medications: I have reviewed the patient's current  medications.   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, status post a left colectomy 03/04/2019 Mass felt to be in the transverse colon on a colonoscopy 01/07/2019 with a biopsy confirming poorly differentiated adenocarcinoma with signet ring cell features, mismatch repair protein expression normal CT abdomen/pelvis 01/08/2019 12.3 cm indeterminate splenic mass, present in 2015 on a lumbar MRI-felt to be benign, small subpleural and pleural-based nodules at the lung bases, status post left nephrectomy CT chest 01/20/2019-small bilateral pulmonary nodules with rounded parenchymal nodules in the right lower lobe, indeterminate splenic mass CT chest 03/18/2019-multiple subcentimeter pulmonary nodules again seen bilaterally, greatest in the lower lobes, without significant change.  No new or enlarging pulmonary nodules or masses.  Partially visualized large heterogeneous splenic mass with no significant change.  Plan for follow-up chest CT at a 4-monthinterval. Cycle 1 FOLFOX 03/21/2019 Cycle 2 FOLFOX 04/04/2019 Cycle 3 FOLFOX 04/18/2019 (oxaliplatin dose reduced secondary to thrombocytopenia) Cycle 4 FOLFOX 05/02/2019 (oxaliplatin held secondary to thrombocytopenia) Cycle 5 FOLFOX 05/16/2019 Cycle 6 FOLFOX 05/30/2019 (oxaliplatin held secondary to thrombocytopenia) CT chest 06/08/2019-stable scattered small solid pulmonary nodules  Cycle 7 FOLFOX 06/13/2019 Cycle 8 FOLFOX 06/27/2019 Cycle 9 FOLFOX 07/11/2019 Cycle 10 FOLFOX 07/25/2019 (oxaliplatin held due to thrombocytopenia) Cycle 11 FOLFOX 08/08/2019 Cycle 12 FOLFOX 08/22/2019 CTs 01/09/2020-no evidence of recurrent disease, stable lung nodules favored to represent subpleural lymph nodes-dedicated follow-up not recommended CTs 01/11/2021-no evidence of recurrent disease   Multiple colon polyps-ascending colon polyps on the colonoscopy  01/07/2019-not removed Colonoscopy 01/03/2020-multiple polyps removed, tubular adenomas, inflammatory and hyperplastic polyps 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  Left nephrectomy at age [age]  Port-A-Cath placement, Dr. GJohney Maine 03/16/2019 13.  Thrombocytopenia secondary to chemotherapy-oxaliplatin dose reduced beginning with cycle 3 FOLFOX, improved 14.  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  Minimally invasive mitral valve repair 05/09/2020      Disposition: Mr. JBinsfeldremains in clinical remission from colon cancer.  He will return for an office visit and CEA in 6 months.  GBetsy Coder MD  01/14/2021  8:39 AM

## 2021-01-15 DIAGNOSIS — D49512 Neoplasm of unspecified behavior of left kidney: Secondary | ICD-10-CM | POA: Diagnosis not present

## 2021-01-15 DIAGNOSIS — R972 Elevated prostate specific antigen [PSA]: Secondary | ICD-10-CM | POA: Diagnosis not present

## 2021-01-15 DIAGNOSIS — N500 Atrophy of testis: Secondary | ICD-10-CM | POA: Diagnosis not present

## 2021-01-15 DIAGNOSIS — Z905 Acquired absence of kidney: Secondary | ICD-10-CM | POA: Diagnosis not present

## 2021-01-16 DIAGNOSIS — E1169 Type 2 diabetes mellitus with other specified complication: Secondary | ICD-10-CM | POA: Diagnosis not present

## 2021-01-16 DIAGNOSIS — I10 Essential (primary) hypertension: Secondary | ICD-10-CM | POA: Diagnosis not present

## 2021-01-16 DIAGNOSIS — Z23 Encounter for immunization: Secondary | ICD-10-CM | POA: Diagnosis not present

## 2021-01-16 DIAGNOSIS — Z7984 Long term (current) use of oral hypoglycemic drugs: Secondary | ICD-10-CM | POA: Diagnosis not present

## 2021-01-16 DIAGNOSIS — E7800 Pure hypercholesterolemia, unspecified: Secondary | ICD-10-CM | POA: Diagnosis not present

## 2021-01-16 DIAGNOSIS — J45909 Unspecified asthma, uncomplicated: Secondary | ICD-10-CM | POA: Diagnosis not present

## 2021-04-04 DIAGNOSIS — C61 Malignant neoplasm of prostate: Secondary | ICD-10-CM | POA: Diagnosis not present

## 2021-04-04 DIAGNOSIS — R972 Elevated prostate specific antigen [PSA]: Secondary | ICD-10-CM | POA: Diagnosis not present

## 2021-04-05 DIAGNOSIS — I10 Essential (primary) hypertension: Secondary | ICD-10-CM | POA: Diagnosis not present

## 2021-04-05 DIAGNOSIS — E1121 Type 2 diabetes mellitus with diabetic nephropathy: Secondary | ICD-10-CM | POA: Diagnosis not present

## 2021-04-15 DIAGNOSIS — N500 Atrophy of testis: Secondary | ICD-10-CM | POA: Diagnosis not present

## 2021-04-15 DIAGNOSIS — Z905 Acquired absence of kidney: Secondary | ICD-10-CM | POA: Diagnosis not present

## 2021-04-15 DIAGNOSIS — C61 Malignant neoplasm of prostate: Secondary | ICD-10-CM | POA: Diagnosis not present

## 2021-04-15 DIAGNOSIS — D49512 Neoplasm of unspecified behavior of left kidney: Secondary | ICD-10-CM | POA: Diagnosis not present

## 2021-05-04 ENCOUNTER — Encounter: Payer: Self-pay | Admitting: Interventional Cardiology

## 2021-05-07 ENCOUNTER — Encounter: Payer: Self-pay | Admitting: Oncology

## 2021-07-15 ENCOUNTER — Ambulatory Visit: Payer: Self-pay | Admitting: Oncology

## 2021-07-15 ENCOUNTER — Other Ambulatory Visit: Payer: BC Managed Care – PPO

## 2021-07-16 ENCOUNTER — Ambulatory Visit: Payer: Self-pay | Admitting: Oncology

## 2021-07-16 ENCOUNTER — Other Ambulatory Visit: Payer: BC Managed Care – PPO

## 2021-07-17 DIAGNOSIS — E1169 Type 2 diabetes mellitus with other specified complication: Secondary | ICD-10-CM | POA: Diagnosis not present

## 2021-07-17 DIAGNOSIS — E7800 Pure hypercholesterolemia, unspecified: Secondary | ICD-10-CM | POA: Diagnosis not present

## 2021-07-17 DIAGNOSIS — Z7984 Long term (current) use of oral hypoglycemic drugs: Secondary | ICD-10-CM | POA: Diagnosis not present

## 2021-07-17 DIAGNOSIS — J45909 Unspecified asthma, uncomplicated: Secondary | ICD-10-CM | POA: Diagnosis not present

## 2021-07-17 DIAGNOSIS — I10 Essential (primary) hypertension: Secondary | ICD-10-CM | POA: Diagnosis not present

## 2021-07-19 ENCOUNTER — Inpatient Hospital Stay: Payer: BC Managed Care – PPO | Admitting: Oncology

## 2021-07-19 ENCOUNTER — Encounter: Payer: Self-pay | Admitting: Oncology

## 2021-07-19 ENCOUNTER — Telehealth: Payer: Self-pay | Admitting: *Deleted

## 2021-07-19 ENCOUNTER — Inpatient Hospital Stay: Payer: BC Managed Care – PPO | Attending: Oncology

## 2021-07-19 VITALS — BP 137/89 | HR 76 | Temp 98.1°F | Resp 18 | Ht 72.0 in | Wt 214.0 lb

## 2021-07-19 DIAGNOSIS — Z9221 Personal history of antineoplastic chemotherapy: Secondary | ICD-10-CM | POA: Diagnosis not present

## 2021-07-19 DIAGNOSIS — C61 Malignant neoplasm of prostate: Secondary | ICD-10-CM | POA: Diagnosis not present

## 2021-07-19 DIAGNOSIS — Z85038 Personal history of other malignant neoplasm of large intestine: Secondary | ICD-10-CM | POA: Insufficient documentation

## 2021-07-19 DIAGNOSIS — C186 Malignant neoplasm of descending colon: Secondary | ICD-10-CM

## 2021-07-19 LAB — CEA (ACCESS): CEA (CHCC): 1.15 ng/mL (ref 0.00–5.00)

## 2021-07-19 NOTE — Progress Notes (Signed)
?[place] ?OFFICE PROGRESS NOTE ? ? ?Diagnosis: Colon cancer ? ?INTERVAL HISTORY:  ? ?[name] returns as scheduled.  He feels well.  Good appetite.  No difficulty with bowel function.  No bleeding.  No neuropathy symptoms.  He is traveling with work.  He has been diagnosed with early stage prostate cancer.  He is followed by Dr. Tresa Moore with observation. ? ?Objective: ? ?Vital signs in last 24 hours: ? ?Blood pressure 137/89, pulse 76, temperature 98.1 ?F (36.7 ?C), temperature source Oral, resp. rate 18, height 6' (1.829 m), weight 214 lb (97.1 kg), SpO2 100 %. ?  ? ?Lymphatics: No cervical, supraclavicular, axillary, or inguinal nodes ?Resp: Lungs clear bilaterally ?Cardio: Regular rate and rhythm ?GI: No hepatosplenomegaly ?Vascular: No leg edema  ?Skin: Multiple lipomas ? ? ?Lab Results: ? ?Lab Results  ?Component Value Date  ? WBC 6.1 05/14/2020  ? HGB 11.2 (L) 05/14/2020  ? HCT 33.1 (L) 05/14/2020  ? MCV 95.9 05/14/2020  ? PLT 119 (L) 05/14/2020  ? NEUTROABS 9.5 (H) 04/05/2020  ? ? ?CMP  ?Lab Results  ?Component Value Date  ? NA 137 01/11/2021  ? K 4.6 01/11/2021  ? CL 101 01/11/2021  ? CO2 28 01/11/2021  ? GLUCOSE 196 (H) 01/11/2021  ? BUN 18 01/11/2021  ? CREATININE 0.96 01/11/2021  ? CALCIUM 9.2 01/11/2021  ? PROT 6.8 05/07/2020  ? ALBUMIN 3.9 05/07/2020  ? AST 30 05/07/2020  ? ALT 29 05/07/2020  ? ALKPHOS 43 05/07/2020  ? BILITOT 1.3 (H) 05/07/2020  ? GFRNONAA >60 01/11/2021  ? GFRAA 115 02/08/2020  ? ? ?Lab Results  ?Component Value Date  ? CEA1 2.00 07/13/2020  ? CEA 1.12 01/11/2021  ? ? ?Medications: I have reviewed the patient's current medications. ? ? 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, status post a left colectomy 03/04/2019 ?Mass felt to be in the transverse colon on a colonoscopy 01/07/2019 with a biopsy confirming poorly differentiated adenocarcinoma with  signet ring cell features, mismatch repair protein expression normal ?CT abdomen/pelvis 01/08/2019 12.3 cm indeterminate splenic mass, present in 2015 on a lumbar MRI-felt to be benign, small subpleural and pleural-based nodules at the lung bases, status post left nephrectomy ?CT chest 01/20/2019-small bilateral pulmonary nodules with rounded parenchymal nodules in the right lower lobe, indeterminate splenic mass ?CT chest 03/18/2019-multiple subcentimeter pulmonary nodules again seen bilaterally, greatest in the lower lobes, without significant change.  No new or enlarging pulmonary nodules or masses.  Partially visualized large heterogeneous splenic mass with no significant change.  Plan for follow-up chest CT at a 29-monthinterval. ?Cycle 1 FOLFOX 03/21/2019 ?Cycle 2 FOLFOX 04/04/2019 ?Cycle 3 FOLFOX 04/18/2019 (oxaliplatin dose reduced secondary to thrombocytopenia) ?Cycle 4 FOLFOX 05/02/2019 (oxaliplatin held secondary to thrombocytopenia) ?Cycle 5 FOLFOX 05/16/2019 ?Cycle 6 FOLFOX 05/30/2019 (oxaliplatin held secondary to thrombocytopenia) ?CT chest 06/08/2019-stable scattered small solid pulmonary nodules  ?Cycle 7 FOLFOX 06/13/2019 ?Cycle 8 FOLFOX 06/27/2019 ?Cycle 9 FOLFOX 07/11/2019 ?Cycle 10 FOLFOX 07/25/2019 (oxaliplatin held due to thrombocytopenia) ?Cycle 11 FOLFOX 08/08/2019 ?Cycle 12 FOLFOX 08/22/2019 ?CTs 01/09/2020-no evidence of recurrent disease, stable lung nodules favored to represent subpleural lymph nodes-dedicated follow-up not recommended ?CTs 01/11/2021-no evidence of recurrent disease ?  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  Left nephrectomy at age 35 ?68.  Port-A-Cath placement, Dr. Johney Maine, 03/16/2019 ?13.  Thrombocytopenia secondary to chemotherapy-oxaliplatin dose reduced beginning with cycle 3 FOLFOX, improved ?14.  Oxaliplatin neuropathy, mild loss of vibratory sense on exam 06/27/2019, 08/08/2019 ?15.  Minimally invasive mitral valve repair 05/09/2020 ?16.  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  ? ? ? ?Disposition: ?[name] is in clinical remission from colon cancer.  We will follow-up on the CEA from today.  He will return for an office visit and surveillance CTs in 6 months.  He he is due for a surveillance colonoscopy with Dr. Michail Sermon later this year.  He will continue follow-up with Dr. Tresa Moore for prostate cancer. ? ?Betsy Coder, MD ? ?07/19/2021  ?8:31 AM ? ? ?

## 2021-07-19 NOTE — Telephone Encounter (Signed)
-----   Message from Ladell Pier, MD sent at 07/19/2021  3:22 PM EDT ----- ?Please call patient, CEA is normal, follow-up as scheduled ? ?

## 2021-07-19 NOTE — Telephone Encounter (Signed)
Notified of normal CEA and to f/u as scheduled.  

## 2021-08-13 ENCOUNTER — Other Ambulatory Visit: Payer: Self-pay

## 2021-08-13 ENCOUNTER — Emergency Department (HOSPITAL_COMMUNITY): Payer: BC Managed Care – PPO | Admitting: Certified Registered"

## 2021-08-13 ENCOUNTER — Emergency Department (HOSPITAL_BASED_OUTPATIENT_CLINIC_OR_DEPARTMENT_OTHER): Payer: BC Managed Care – PPO

## 2021-08-13 ENCOUNTER — Encounter (HOSPITAL_BASED_OUTPATIENT_CLINIC_OR_DEPARTMENT_OTHER): Payer: Self-pay

## 2021-08-13 ENCOUNTER — Encounter (HOSPITAL_COMMUNITY)
Admission: EM | Disposition: A | Discharge status: Home Health | Payer: Self-pay | Source: Home / Self Care | Attending: Internal Medicine

## 2021-08-13 ENCOUNTER — Inpatient Hospital Stay (HOSPITAL_BASED_OUTPATIENT_CLINIC_OR_DEPARTMENT_OTHER)
Admission: EM | Admit: 2021-08-13 | Discharge: 2021-08-17 | DRG: 464 | Disposition: A | Discharge status: Home Health | Payer: BC Managed Care – PPO | Attending: Internal Medicine | Admitting: Internal Medicine

## 2021-08-13 DIAGNOSIS — T8189XA Other complications of procedures, not elsewhere classified, initial encounter: Secondary | ICD-10-CM | POA: Diagnosis not present

## 2021-08-13 DIAGNOSIS — E1165 Type 2 diabetes mellitus with hyperglycemia: Secondary | ICD-10-CM | POA: Diagnosis not present

## 2021-08-13 DIAGNOSIS — E119 Type 2 diabetes mellitus without complications: Secondary | ICD-10-CM | POA: Diagnosis not present

## 2021-08-13 DIAGNOSIS — M726 Necrotizing fasciitis: Principal | ICD-10-CM | POA: Diagnosis present

## 2021-08-13 DIAGNOSIS — E890 Postprocedural hypothyroidism: Secondary | ICD-10-CM | POA: Diagnosis not present

## 2021-08-13 DIAGNOSIS — Z7982 Long term (current) use of aspirin: Secondary | ICD-10-CM | POA: Diagnosis not present

## 2021-08-13 DIAGNOSIS — Z7984 Long term (current) use of oral hypoglycemic drugs: Secondary | ICD-10-CM | POA: Diagnosis not present

## 2021-08-13 DIAGNOSIS — Z794 Long term (current) use of insulin: Secondary | ICD-10-CM

## 2021-08-13 DIAGNOSIS — D696 Thrombocytopenia, unspecified: Secondary | ICD-10-CM | POA: Diagnosis present

## 2021-08-13 DIAGNOSIS — N433 Hydrocele, unspecified: Secondary | ICD-10-CM | POA: Diagnosis not present

## 2021-08-13 DIAGNOSIS — Z905 Acquired absence of kidney: Secondary | ICD-10-CM

## 2021-08-13 DIAGNOSIS — T797XXA Traumatic subcutaneous emphysema, initial encounter: Secondary | ICD-10-CM | POA: Diagnosis not present

## 2021-08-13 DIAGNOSIS — Z85528 Personal history of other malignant neoplasm of kidney: Secondary | ICD-10-CM | POA: Diagnosis not present

## 2021-08-13 DIAGNOSIS — Z9049 Acquired absence of other specified parts of digestive tract: Secondary | ICD-10-CM | POA: Diagnosis not present

## 2021-08-13 DIAGNOSIS — Z800 Family history of malignant neoplasm of digestive organs: Secondary | ICD-10-CM

## 2021-08-13 DIAGNOSIS — Z888 Allergy status to other drugs, medicaments and biological substances status: Secondary | ICD-10-CM | POA: Diagnosis not present

## 2021-08-13 DIAGNOSIS — Z823 Family history of stroke: Secondary | ICD-10-CM

## 2021-08-13 DIAGNOSIS — I96 Gangrene, not elsewhere classified: Secondary | ICD-10-CM | POA: Diagnosis not present

## 2021-08-13 DIAGNOSIS — Z9889 Other specified postprocedural states: Secondary | ICD-10-CM

## 2021-08-13 DIAGNOSIS — Z8546 Personal history of malignant neoplasm of prostate: Secondary | ICD-10-CM

## 2021-08-13 DIAGNOSIS — A419 Sepsis, unspecified organism: Secondary | ICD-10-CM

## 2021-08-13 DIAGNOSIS — Z872 Personal history of diseases of the skin and subcutaneous tissue: Secondary | ICD-10-CM | POA: Diagnosis not present

## 2021-08-13 DIAGNOSIS — E785 Hyperlipidemia, unspecified: Secondary | ICD-10-CM | POA: Diagnosis present

## 2021-08-13 DIAGNOSIS — Z79899 Other long term (current) drug therapy: Secondary | ICD-10-CM

## 2021-08-13 DIAGNOSIS — L0390 Cellulitis, unspecified: Secondary | ICD-10-CM | POA: Diagnosis not present

## 2021-08-13 DIAGNOSIS — N400 Enlarged prostate without lower urinary tract symptoms: Secondary | ICD-10-CM | POA: Diagnosis not present

## 2021-08-13 DIAGNOSIS — Z8249 Family history of ischemic heart disease and other diseases of the circulatory system: Secondary | ICD-10-CM

## 2021-08-13 DIAGNOSIS — Z1152 Encounter for screening for COVID-19: Secondary | ICD-10-CM

## 2021-08-13 DIAGNOSIS — Z952 Presence of prosthetic heart valve: Secondary | ICD-10-CM | POA: Diagnosis not present

## 2021-08-13 DIAGNOSIS — E782 Mixed hyperlipidemia: Secondary | ICD-10-CM | POA: Diagnosis not present

## 2021-08-13 DIAGNOSIS — Z85038 Personal history of other malignant neoplasm of large intestine: Secondary | ICD-10-CM

## 2021-08-13 DIAGNOSIS — M7989 Other specified soft tissue disorders: Secondary | ICD-10-CM | POA: Diagnosis present

## 2021-08-13 DIAGNOSIS — C186 Malignant neoplasm of descending colon: Secondary | ICD-10-CM | POA: Diagnosis present

## 2021-08-13 DIAGNOSIS — L03314 Cellulitis of groin: Secondary | ICD-10-CM | POA: Diagnosis present

## 2021-08-13 DIAGNOSIS — Z7901 Long term (current) use of anticoagulants: Secondary | ICD-10-CM | POA: Diagnosis not present

## 2021-08-13 DIAGNOSIS — I10 Essential (primary) hypertension: Secondary | ICD-10-CM | POA: Diagnosis not present

## 2021-08-13 DIAGNOSIS — L03115 Cellulitis of right lower limb: Secondary | ICD-10-CM | POA: Diagnosis not present

## 2021-08-13 DIAGNOSIS — Z8679 Personal history of other diseases of the circulatory system: Secondary | ICD-10-CM | POA: Diagnosis not present

## 2021-08-13 DIAGNOSIS — L03311 Cellulitis of abdominal wall: Secondary | ICD-10-CM | POA: Diagnosis not present

## 2021-08-13 HISTORY — PX: INCISION AND DRAINAGE OF WOUND: SHX1803

## 2021-08-13 LAB — CBC WITH DIFFERENTIAL/PLATELET
Abs Immature Granulocytes: 0.05 10*3/uL (ref 0.00–0.07)
Basophils Absolute: 0 10*3/uL (ref 0.0–0.1)
Basophils Relative: 0 %
Eosinophils Absolute: 0.1 10*3/uL (ref 0.0–0.5)
Eosinophils Relative: 1 %
HCT: 43.8 % (ref 39.0–52.0)
Hemoglobin: 14.7 g/dL (ref 13.0–17.0)
Immature Granulocytes: 1 %
Lymphocytes Relative: 6 %
Lymphs Abs: 0.7 10*3/uL (ref 0.7–4.0)
MCH: 31.3 pg (ref 26.0–34.0)
MCHC: 33.6 g/dL (ref 30.0–36.0)
MCV: 93.2 fL (ref 80.0–100.0)
Monocytes Absolute: 0.8 10*3/uL (ref 0.1–1.0)
Monocytes Relative: 7 %
Neutro Abs: 9.1 10*3/uL — ABNORMAL HIGH (ref 1.7–7.7)
Neutrophils Relative %: 85 %
Platelets: 147 10*3/uL — ABNORMAL LOW (ref 150–400)
RBC: 4.7 MIL/uL (ref 4.22–5.81)
RDW: 12.7 % (ref 11.5–15.5)
WBC: 10.7 10*3/uL — ABNORMAL HIGH (ref 4.0–10.5)
nRBC: 0 % (ref 0.0–0.2)

## 2021-08-13 LAB — COMPREHENSIVE METABOLIC PANEL
ALT: 30 U/L (ref 0–44)
AST: 30 U/L (ref 15–41)
Albumin: 3.7 g/dL (ref 3.5–5.0)
Alkaline Phosphatase: 45 U/L (ref 38–126)
Anion gap: 8 (ref 5–15)
BUN: 20 mg/dL (ref 6–20)
CO2: 25 mmol/L (ref 22–32)
Calcium: 9.2 mg/dL (ref 8.9–10.3)
Chloride: 100 mmol/L (ref 98–111)
Creatinine, Ser: 1.07 mg/dL (ref 0.61–1.24)
GFR, Estimated: >60 mL/min (ref >60)
Glucose, Bld: 203 mg/dL — ABNORMAL HIGH (ref 70–99)
Potassium: 4.3 mmol/L (ref 3.5–5.1)
Sodium: 133 mmol/L — ABNORMAL LOW (ref 135–145)
Total Bilirubin: 1.9 mg/dL — ABNORMAL HIGH (ref 0.3–1.2)
Total Protein: 7.2 g/dL (ref 6.5–8.1)

## 2021-08-13 LAB — URINALYSIS, ROUTINE W REFLEX MICROSCOPIC
Bilirubin Urine: NEGATIVE
Glucose, UA: NEGATIVE mg/dL
Hgb urine dipstick: NEGATIVE
Ketones, ur: NEGATIVE mg/dL
Leukocytes,Ua: NEGATIVE
Nitrite: NEGATIVE
Protein, ur: 30 mg/dL — AB
Specific Gravity, Urine: 1.015 (ref 1.005–1.030)
pH: 5.5 (ref 5.0–8.0)

## 2021-08-13 LAB — PROTIME-INR
INR: 1.1 (ref 0.8–1.2)
Prothrombin Time: 14.2 seconds (ref 11.4–15.2)

## 2021-08-13 LAB — RESP PANEL BY RT-PCR (FLU A&B, COVID) ARPGX2
Influenza A by PCR: NEGATIVE
Influenza B by PCR: NEGATIVE
SARS Coronavirus 2 by RT PCR: NEGATIVE

## 2021-08-13 LAB — GLUCOSE, CAPILLARY
Glucose-Capillary: 157 mg/dL — ABNORMAL HIGH (ref 70–99)
Glucose-Capillary: 109 mg/dL — ABNORMAL HIGH (ref 70–99)

## 2021-08-13 LAB — LACTIC ACID, PLASMA
Lactic Acid, Venous: 2.3 mmol/L (ref 0.5–1.9)
Lactic Acid, Venous: 1.6 mmol/L (ref 0.5–1.9)

## 2021-08-13 LAB — URINALYSIS, MICROSCOPIC (REFLEX): Squamous Epithelial / HPF: NONE SEEN (ref 0–5)

## 2021-08-13 LAB — APTT: aPTT: 27 seconds (ref 24–36)

## 2021-08-13 LAB — LIPASE, BLOOD: Lipase: 33 U/L (ref 11–51)

## 2021-08-13 SURGERY — IRRIGATION AND DEBRIDEMENT WOUND
Anesthesia: General | Site: Groin | Laterality: Right

## 2021-08-13 MED ORDER — SUCCINYLCHOLINE CHLORIDE 200 MG/10ML IV SOSY
PREFILLED_SYRINGE | INTRAVENOUS | Status: DC | PRN
  Administered 2021-08-13: 60 mg via INTRAVENOUS

## 2021-08-13 MED ORDER — LACTATED RINGERS IV SOLN: INTRAVENOUS | Status: DC

## 2021-08-13 MED ORDER — HYDROMORPHONE HCL 1 MG/ML IJ SOLN: 0.2500 mg | INTRAMUSCULAR | Status: DC | PRN

## 2021-08-13 MED ORDER — SUGAMMADEX SODIUM 200 MG/2ML IV SOLN
INTRAVENOUS | Status: DC | PRN
  Administered 2021-08-13: 200 mg via INTRAVENOUS

## 2021-08-13 MED ORDER — ONDANSETRON HCL 4 MG PO TABS: 4.0000 mg | ORAL_TABLET | Freq: Four times a day (QID) | ORAL | Status: DC | PRN

## 2021-08-13 MED ORDER — FENTANYL CITRATE (PF) 250 MCG/5ML IJ SOLN
INTRAMUSCULAR | Status: DC | PRN
  Administered 2021-08-13: 50 ug via INTRAVENOUS
  Administered 2021-08-13: 100 ug via INTRAVENOUS

## 2021-08-13 MED ORDER — ORAL CARE MOUTH RINSE: 15.0000 mL | Freq: Once | OROMUCOSAL | Status: AC

## 2021-08-13 MED ORDER — SODIUM CHLORIDE 0.9 % IV SOLN: Freq: Once | INTRAVENOUS | Status: AC

## 2021-08-13 MED ORDER — HYDROMORPHONE HCL 1 MG/ML IJ SOLN
1.0000 mg | INTRAMUSCULAR | Status: AC | PRN
  Administered 2021-08-14 (×2): 1 mg via INTRAVENOUS
  Filled 2021-08-13 (×2): qty 1

## 2021-08-13 MED ORDER — DEXAMETHASONE SODIUM PHOSPHATE 10 MG/ML IJ SOLN
INTRAMUSCULAR | Status: DC | PRN
  Administered 2021-08-13: 5 mg via INTRAVENOUS

## 2021-08-13 MED ORDER — ONDANSETRON HCL 4 MG/2ML IJ SOLN
4.0000 mg | Freq: Four times a day (QID) | INTRAMUSCULAR | Status: DC | PRN
  Administered 2021-08-15: 4 mg via INTRAVENOUS

## 2021-08-13 MED ORDER — ACETAMINOPHEN 325 MG PO TABS
650.0000 mg | ORAL_TABLET | Freq: Once | ORAL | Status: AC | PRN
  Administered 2021-08-13: 650 mg via ORAL
  Filled 2021-08-13: qty 2

## 2021-08-13 MED ORDER — MEPERIDINE HCL 25 MG/ML IJ SOLN: 6.2500 mg | INTRAMUSCULAR | Status: DC | PRN

## 2021-08-13 MED ORDER — MORPHINE SULFATE (PF) 2 MG/ML IV SOLN
2.0000 mg | Freq: Once | INTRAVENOUS | Status: AC
  Administered 2021-08-13: 2 mg via INTRAVENOUS
  Filled 2021-08-13: qty 1

## 2021-08-13 MED ORDER — SODIUM CHLORIDE 0.9% FLUSH
3.0000 mL | Freq: Two times a day (BID) | INTRAVENOUS | Status: DC
  Administered 2021-08-13 – 2021-08-16 (×6): 3 mL via INTRAVENOUS

## 2021-08-13 MED ORDER — AMISULPRIDE (ANTIEMETIC) 5 MG/2ML IV SOLN: 10.0000 mg | Freq: Once | INTRAVENOUS | Status: DC | PRN

## 2021-08-13 MED ORDER — METRONIDAZOLE 500 MG/100ML IV SOLN
500.0000 mg | Freq: Two times a day (BID) | INTRAVENOUS | Status: DC
  Administered 2021-08-13: 500 mg via INTRAVENOUS
  Filled 2021-08-13: qty 100

## 2021-08-13 MED ORDER — SUCCINYLCHOLINE CHLORIDE 200 MG/10ML IV SOSY
PREFILLED_SYRINGE | INTRAVENOUS | Status: AC
  Filled 2021-08-13: qty 10

## 2021-08-13 MED ORDER — 0.9 % SODIUM CHLORIDE (POUR BTL) OPTIME
TOPICAL | Status: DC | PRN
  Administered 2021-08-13: 1000 mL

## 2021-08-13 MED ORDER — SENNOSIDES-DOCUSATE SODIUM 8.6-50 MG PO TABS: 1.0000 | ORAL_TABLET | Freq: Every evening | ORAL | Status: DC | PRN

## 2021-08-13 MED ORDER — INSULIN ASPART 100 UNIT/ML IJ SOLN
0.0000 [IU] | INTRAMUSCULAR | Status: DC
  Administered 2021-08-14: 2 [IU] via SUBCUTANEOUS
  Administered 2021-08-14 (×2): 3 [IU] via SUBCUTANEOUS
  Administered 2021-08-14: 5 [IU] via SUBCUTANEOUS
  Administered 2021-08-14: 3 [IU] via SUBCUTANEOUS
  Administered 2021-08-14: 2 [IU] via SUBCUTANEOUS
  Administered 2021-08-15: 1 [IU] via SUBCUTANEOUS
  Administered 2021-08-15: 2 [IU] via SUBCUTANEOUS
  Administered 2021-08-15: 7 [IU] via SUBCUTANEOUS
  Administered 2021-08-15: 5 [IU] via SUBCUTANEOUS
  Administered 2021-08-15 (×2): 2 [IU] via SUBCUTANEOUS
  Administered 2021-08-15: 1 [IU] via SUBCUTANEOUS
  Administered 2021-08-16 (×3): 2 [IU] via SUBCUTANEOUS
  Administered 2021-08-16 (×2): 3 [IU] via SUBCUTANEOUS
  Administered 2021-08-16: 2 [IU] via SUBCUTANEOUS
  Administered 2021-08-17: 1 [IU] via SUBCUTANEOUS
  Administered 2021-08-17: 2 [IU] via SUBCUTANEOUS

## 2021-08-13 MED ORDER — ONDANSETRON HCL 4 MG/2ML IJ SOLN
INTRAMUSCULAR | Status: AC
  Filled 2021-08-13: qty 2

## 2021-08-13 MED ORDER — CHLORHEXIDINE GLUCONATE 0.12 % MT SOLN: 15.0000 mL | Freq: Once | OROMUCOSAL | Status: AC

## 2021-08-13 MED ORDER — MIDAZOLAM HCL 2 MG/2ML IJ SOLN
INTRAMUSCULAR | Status: DC | PRN
  Administered 2021-08-13: 2 mg via INTRAVENOUS

## 2021-08-13 MED ORDER — SODIUM CHLORIDE 0.9 % IV SOLN
1.0000 g | Freq: Three times a day (TID) | INTRAVENOUS | Status: DC
  Administered 2021-08-13 – 2021-08-15 (×5): 1 g via INTRAVENOUS
  Filled 2021-08-13 (×8): qty 20

## 2021-08-13 MED ORDER — VANCOMYCIN HCL IN DEXTROSE 1-5 GM/200ML-% IV SOLN
1000.0000 mg | Freq: Once | INTRAVENOUS | Status: DC
  Filled 2021-08-13: qty 200

## 2021-08-13 MED ORDER — IOHEXOL 300 MG/ML  SOLN
100.0000 mL | Freq: Once | INTRAMUSCULAR | Status: AC | PRN
  Administered 2021-08-13: 100 mL via INTRAVENOUS

## 2021-08-13 MED ORDER — SODIUM CHLORIDE 0.9 % IV BOLUS
1000.0000 mL | Freq: Once | INTRAVENOUS | Status: AC
  Administered 2021-08-13: 1000 mL via INTRAVENOUS

## 2021-08-13 MED ORDER — ACETAMINOPHEN 10 MG/ML IV SOLN: 1000.0000 mg | Freq: Once | INTRAVENOUS | Status: DC | PRN

## 2021-08-13 MED ORDER — SODIUM CHLORIDE 0.9 % IV SOLN: INTRAVENOUS | Status: DC | PRN

## 2021-08-13 MED ORDER — DEXAMETHASONE SODIUM PHOSPHATE 10 MG/ML IJ SOLN
INTRAMUSCULAR | Status: AC
  Filled 2021-08-13: qty 1

## 2021-08-13 MED ORDER — VANCOMYCIN HCL IN DEXTROSE 1-5 GM/200ML-% IV SOLN
1000.0000 mg | Freq: Once | INTRAVENOUS | Status: AC
  Administered 2021-08-13: 1000 mg via INTRAVENOUS

## 2021-08-13 MED ORDER — LIDOCAINE 2% (20 MG/ML) 5 ML SYRINGE
INTRAMUSCULAR | Status: AC
  Filled 2021-08-13: qty 5

## 2021-08-13 MED ORDER — PROPOFOL 10 MG/ML IV BOLUS
INTRAVENOUS | Status: DC | PRN
  Administered 2021-08-13: 150 mg via INTRAVENOUS

## 2021-08-13 MED ORDER — ACETAMINOPHEN 650 MG RE SUPP: 650.0000 mg | Freq: Four times a day (QID) | RECTAL | Status: DC | PRN

## 2021-08-13 MED ORDER — LINEZOLID 600 MG/300ML IV SOLN
600.0000 mg | Freq: Two times a day (BID) | INTRAVENOUS | Status: DC
  Administered 2021-08-13 – 2021-08-16 (×7): 600 mg via INTRAVENOUS
  Filled 2021-08-13 (×8): qty 300

## 2021-08-13 MED ORDER — ROCURONIUM BROMIDE 10 MG/ML (PF) SYRINGE
PREFILLED_SYRINGE | INTRAVENOUS | Status: AC
  Filled 2021-08-13: qty 10

## 2021-08-13 MED ORDER — VANCOMYCIN HCL 1250 MG/250ML IV SOLN
1250.0000 mg | Freq: Two times a day (BID) | INTRAVENOUS | Status: DC
  Filled 2021-08-13: qty 250

## 2021-08-13 MED ORDER — LIDOCAINE 2% (20 MG/ML) 5 ML SYRINGE
INTRAMUSCULAR | Status: DC | PRN
  Administered 2021-08-13: 40 mg via INTRAVENOUS

## 2021-08-13 MED ORDER — INSULIN ASPART 100 UNIT/ML IJ SOLN
0.0000 [IU] | INTRAMUSCULAR | Status: DC | PRN
  Administered 2021-08-13: 2 [IU] via SUBCUTANEOUS
  Filled 2021-08-13: qty 1

## 2021-08-13 MED ORDER — CHLORHEXIDINE GLUCONATE 0.12 % MT SOLN
OROMUCOSAL | Status: AC
  Administered 2021-08-13: 15 mL via OROMUCOSAL
  Filled 2021-08-13: qty 15

## 2021-08-13 MED ORDER — ROCURONIUM BROMIDE 10 MG/ML (PF) SYRINGE
PREFILLED_SYRINGE | INTRAVENOUS | Status: DC | PRN
  Administered 2021-08-13: 50 mg via INTRAVENOUS

## 2021-08-13 MED ORDER — SODIUM CHLORIDE 0.9 % IV SOLN
2.0000 g | Freq: Three times a day (TID) | INTRAVENOUS | Status: DC
  Administered 2021-08-13: 2 g via INTRAVENOUS
  Filled 2021-08-13: qty 12.5

## 2021-08-13 MED ORDER — FENTANYL CITRATE (PF) 250 MCG/5ML IJ SOLN
INTRAMUSCULAR | Status: AC
  Filled 2021-08-13: qty 5

## 2021-08-13 MED ORDER — ONDANSETRON HCL 4 MG/2ML IJ SOLN
INTRAMUSCULAR | Status: DC | PRN
  Administered 2021-08-13: 4 mg via INTRAVENOUS

## 2021-08-13 MED ORDER — ACETAMINOPHEN 325 MG PO TABS: 325.0000 mg | ORAL_TABLET | Freq: Once | ORAL | Status: DC | PRN

## 2021-08-13 MED ORDER — ACETAMINOPHEN 160 MG/5ML PO SOLN: 325.0000 mg | Freq: Once | ORAL | Status: DC | PRN

## 2021-08-13 MED ORDER — MIDAZOLAM HCL 2 MG/2ML IJ SOLN
INTRAMUSCULAR | Status: AC
  Filled 2021-08-13: qty 2

## 2021-08-13 MED ORDER — ACETAMINOPHEN 325 MG PO TABS
650.0000 mg | ORAL_TABLET | Freq: Four times a day (QID) | ORAL | Status: DC | PRN
  Administered 2021-08-14 – 2021-08-16 (×5): 650 mg via ORAL
  Filled 2021-08-13 (×5): qty 2

## 2021-08-13 SURGICAL SUPPLY — 31 items
BAG COUNTER SPONGE SURGICOUNT (BAG) ×2 IMPLANT
BNDG GAUZE ELAST 4 BULKY (GAUZE/BANDAGES/DRESSINGS) ×2 IMPLANT
CANISTER SUCT 3000ML PPV (MISCELLANEOUS) ×2 IMPLANT
COVER SURGICAL LIGHT HANDLE (MISCELLANEOUS) ×2 IMPLANT
DRAPE LAPAROSCOPIC ABDOMINAL (DRAPES) IMPLANT
DRAPE LAPAROTOMY 100X72 PEDS (DRAPES) IMPLANT
DRSG PAD ABDOMINAL 8X10 ST (GAUZE/BANDAGES/DRESSINGS) IMPLANT
ELECT REM PT RETURN 9FT ADLT (ELECTROSURGICAL) ×2 IMPLANT
ELECTRODE REM PT RTRN 9FT ADLT (ELECTROSURGICAL) ×1 IMPLANT
GAUZE SPONGE 4X4 12PLY STRL (GAUZE/BANDAGES/DRESSINGS) IMPLANT
GLOVE BIO SURGEON STRL SZ7.5 (GLOVE) ×2 IMPLANT
GLOVE BIOGEL PI IND STRL 8 (GLOVE) ×1 IMPLANT
GLOVE BIOGEL PI INDICATOR 8 (GLOVE) ×1
GLOVE SURG SYN 7.5  E (GLOVE) ×2
GLOVE SURG SYN 7.5 E (GLOVE) ×1 IMPLANT
GLOVE SURG SYN 7.5 PF PI (GLOVE) ×1 IMPLANT
GOWN STRL REUS W/ TWL LRG LVL3 (GOWN DISPOSABLE) ×2 IMPLANT
GOWN STRL REUS W/ TWL XL LVL3 (GOWN DISPOSABLE) ×1 IMPLANT
GOWN STRL REUS W/TWL LRG LVL3 (GOWN DISPOSABLE) ×4
GOWN STRL REUS W/TWL XL LVL3 (GOWN DISPOSABLE) ×2
KIT BASIN OR (CUSTOM PROCEDURE TRAY) ×2 IMPLANT
KIT TURNOVER KIT B (KITS) ×2 IMPLANT
NS IRRIG 1000ML POUR BTL (IV SOLUTION) ×2 IMPLANT
PACK GENERAL/GYN (CUSTOM PROCEDURE TRAY) ×2 IMPLANT
PAD ABD 8X10 STRL (GAUZE/BANDAGES/DRESSINGS) ×1 IMPLANT
PAD ARMBOARD 7.5X6 YLW CONV (MISCELLANEOUS) ×2 IMPLANT
PENCIL SMOKE EVACUATOR (MISCELLANEOUS) ×2 IMPLANT
SWAB COLLECTION DEVICE MRSA (MISCELLANEOUS) IMPLANT
SWAB CULTURE ESWAB REG 1ML (MISCELLANEOUS) IMPLANT
TOWEL GREEN STERILE (TOWEL DISPOSABLE) ×2 IMPLANT
TOWEL GREEN STERILE FF (TOWEL DISPOSABLE) ×2 IMPLANT

## 2021-08-13 NOTE — ED Provider Notes (Addendum)
Shared visit.  Patient comes here with redness and swelling to right lower abdomen.  History of endocarditis status post mitral valve repair, history of bacteremia, multiple lipomas, diabetes.  Patient appears to have infectious process in the right lower abdomen.  I have been asked to evaluate the patient after he has been here with blood work and CT scan.  Overall my concern is for possibly necrotizing infection in his right inguinal area.  Patient has low-grade temperature, leukocytosis, lactic acidosis.  Sepsis protocol now fully initiated with broad-spectrum IV antibiotics to cover for necrotizing infection including IV vancomycin, IV cefepime, IV Flagyl.  Patient states that about 12 hours ago he woke up with redness and swelling in his right groin that has gradually gotten worse with increasing redness and swelling.  He is got multiple lipomas but these are mostly in his right thigh.  Does not appear to communicate with this.  He has had strep bacteremia in the past he states that required repair of his mitral valve.  However the mitral valve was never replaced.  He is not on blood thinners anymore.  CT scan per radiology report shows subcutaneous emphysema tracking through the right inguinal canal but there is no walled off fluid collections to suggest abscess but this is suspicious for gas-forming bacteria.  He has not had any instrumentation of this area.  He does give himself injections of insulin medication but not in this area.  I talked with Dr. Georgette Dover with general surgery and after discussion with him I will be doing ED to ED transfer to [facility].  We will talk with the hospitalist as they would like for medicine to be the primary admit but I think he needs a surgical evaluation soon as possible.  Given some history with his mitral valve believe [facility] would be better in case any cardiac involvement needs to be done during his hospital stay.  I have made Dr. Sherry Ruffing aware of ED to ED  transfer.  Please consult general surgery team upon arrival to let them know that he is there.  We will try to reach out to the medicine team about the patient as well but they may need to be called as well for them to come and do admission. ? ?Please consult general surgery team when patient arrives to the ED. ? ?This chart was dictated using voice recognition software.  Despite best efforts to proofread,  errors can occur which can change the documentation meaning.  ?  Lennice Sites, DO ?08/13/21 1436 ? ?  ?Lennice Sites, DO ?08/13/21 1447 ? ?

## 2021-08-13 NOTE — Progress Notes (Signed)
Pharmacy Antibiotic Note ? ?[name] is a 56 y.o. male admitted on 08/13/2021 with cellulitis/wound infection.  Pharmacy has been consulted for vancomycin dosing.  ? ?Cefepime and Flagyl changed to Meropenem ? ?Plan: ?Meropenem 1 gram iv Q 8 hours ?Continue Vancomycin '1250mg'$  IV q12h. Goal AUC 400-550. ?Expected AUC: 469 ?SCr used: 1.07 ?Monitor clinical progress, c/s, renal function ?F/u de-escalation plan/LOT, vancomycin levels as indicated ? ? ?Height: 6' (182.9 cm) ?Weight: 97.1 kg (214 lb) ?IBW/kg (Calculated) : 77.6 ? ?Temp (24hrs), Avg:100.1 ?F (37.8 ?C), Min:97.3 ?F (36.3 ?C), Max:102.5 ?F (39.2 ?C) ? ?Recent Labs  ?Lab 08/13/21 ?1235 08/13/21 ?1448  ?WBC 10.7*  --   ?CREATININE 1.07  --   ?LATICACIDVEN 2.3* 1.6  ? ?  ?Estimated Creatinine Clearance: 95.3 mL/min (by C-G formula based on SCr of 1.07 mg/dL).   ? ?Allergies  ?Allergen Reactions  ? Empagliflozin   ?  Other reaction(s): Recurrent UTI  ? ? ?Arturo Morton, PharmD, BCPS ?Please check AMION for all [place] contact numbers ?Clinical Pharmacist ?08/13/2021 8:31 PM ? ?

## 2021-08-13 NOTE — ED Provider Notes (Signed)
?[facility] EMERGENCY DEPARTMENT ?Provider Note ? ? ?CSN: [id number] ?Arrival date & time: 08/13/21  1156 ? ?  ? ?History ? ?Chief Complaint  ?Patient presents with  ? Abdominal Pain  ? ? ?[name] is a 56 y.o. male. ? ?HPI ?56 year old male with a history of hyperlipidemia, stage III colon cancer in remission, prostate cancer, DM type II, solitary kidney, endocarditis of the mitral valve presents to the ER with complaints of fevers, fatigue, right lower abdominal pain.  Patient noticed some pain to his right lower groin and after inspection noticed that there was some discharge coming from the skin.  He noticed pus coming out of it.  Despite this, he feels as though the redness continues to spread.  He denies any scrotal or penile pain.  No history of hernias.  Has had poor appetite.  Reports strep bacteremia from a lipoma w/ subsequent endocarditis w/ native valve repair ? ?Home Medications ?Prior to Admission medications   ?Medication Sig Start Date End Date Taking? Authorizing Provider  ?albuterol (PROVENTIL HFA;VENTOLIN HFA) 108 (90 BASE) MCG/ACT inhaler Inhale 1 puff into the lungs every 6 (six) hours as needed for wheezing or shortness of breath.    [provider]  ?aspirin EC 81 MG EC tablet Take 1 tablet (81 mg total) by mouth daily. Swallow whole. 05/15/20   Antony Odea, PA-C  ?atorvastatin (LIPITOR) 10 MG tablet Take 1 tablet (10 mg total) by mouth daily. 03/15/14   Angiulli, Lavon Paganini, PA-C  ?calcium carbonate (TUMS - DOSED IN MG ELEMENTAL CALCIUM) 500 MG chewable tablet Chew 500 mg by mouth daily as needed for indigestion or heartburn.     [provider]  ?fluticasone (FLONASE) 50 MCG/ACT nasal spray Place 1 spray into both nostrils daily as needed for allergies or rhinitis.    [provider]  ?insulin glargine, 1 Unit Dial, (TOUJEO SOLOSTAR) 300 UNIT/ML Solostar Pen 26 units 04/05/21   [provider]  ?lisinopril (ZESTRIL) 2.5 MG tablet Take  2.5 mg by mouth daily. ?Patient not taking: Reported on 07/19/2021 07/17/21   [provider]  ?loratadine (CLARITIN) 10 MG tablet Take 10 mg by mouth daily as needed for allergies.    [provider]  ?metFORMIN (GLUCOPHAGE) 1000 MG tablet Take 1 tablet (1,000 mg total) by mouth 2 (two) times daily. 02/12/20   Jettie Booze, MD  ?mometasone [facility]) 220 MCG/INH inhaler Inhale 1 puff into the lungs daily.    [provider]  ?ONE TOUCH ULTRA TEST test strip 1 each by Other route as needed (GLUCOSE).  05/06/15   [provider]  ?pioglitazone (ACTOS) 30 MG tablet Take 1 tablet by mouth daily.    [provider]  ?repaglinide (PRANDIN) 2 MG tablet Take 2 mg by mouth 2 (two) times daily. 05/16/20   [provider]  ?rivaroxaban (XARELTO) 20 MG TABS tablet Take 1 tablet (20 mg total) by mouth daily with supper. 10/10/20   Jettie Booze, MD  ?TOPROL XL 25 MG 24 hr tablet TAKE 1 TABLET BY MOUTH TWICE A DAY 11/30/20   Jettie Booze, MD  ?   ? ?Allergies    ?Empagliflozin   ? ?Review of Systems   ?Review of Systems ?Ten systems reviewed and are negative for acute change, except as noted in the HPI.  ? ?Physical Exam ?Updated Vital Signs ?BP 140/80 (BP Location: Right Arm)   Pulse 89   Temp (!) 100.8 ?F (38.2 ?C) (Oral)  Resp 19   Ht 6' (1.829 m)   Wt 97.1 kg   SpO2 100%   BMI 29.02 kg/m?  ?Physical Exam ?Vitals and nursing note reviewed.  ?Constitutional:   ?   General: He is not in acute distress. ?   Appearance: He is well-developed.  ?HENT:  ?   Head: Normocephalic and atraumatic.  ?Eyes:  ?   Conjunctiva/sclera: Conjunctivae normal.  ?Cardiovascular:  ?   Rate and Rhythm: Normal rate and regular rhythm.  ?   Heart sounds: No murmur heard. ?Pulmonary:  ?   Effort: Pulmonary effort is normal. No respiratory distress.  ?   Breath sounds: Normal breath sounds.  ?Abdominal:  ?   Palpations: Abdomen is soft.  ?   Tenderness: There is abdominal  tenderness.  ?   Hernia: No hernia is present.  ?   Comments: Large 6 cm x 4 cm area of erythema and induration with no palpable fluctuance,  tender to palpation, warm to the touch.  GU exam performed with RN at bedside, no scrotal/epididymal tenderness and no visible scrotal involvement.   ?Musculoskeletal:     ?   General: No swelling.  ?   Cervical back: Neck supple.  ?Skin: ?   General: Skin is warm and dry.  ?   Capillary Refill: Capillary refill takes less than 2 seconds.  ?   Findings: Erythema present.  ?   Comments: Multiple lipomas on arms, on right thigh but no evidence of communication  ?Neurological:  ?   Mental Status: He is alert.  ?Psychiatric:     ?   Mood and Affect: Mood normal.  ? ? ?ED Results / Procedures / Treatments   ?Labs ?(all labs ordered are listed, but only abnormal results are displayed) ?Labs Reviewed  ?CBC WITH DIFFERENTIAL/PLATELET - Abnormal; Notable for the following components:  ?    Result Value  ? WBC 10.7 (*)   ? Platelets 147 (*)   ? Neutro Abs 9.1 (*)   ? All other components within normal limits  ?COMPREHENSIVE METABOLIC PANEL - Abnormal; Notable for the following components:  ? Sodium 133 (*)   ? Glucose, Bld 203 (*)   ? Total Bilirubin 1.9 (*)   ? All other components within normal limits  ?LACTIC ACID, PLASMA - Abnormal; Notable for the following components:  ? Lactic Acid, Venous 2.3 (*)   ? All other components within normal limits  ?URINALYSIS, ROUTINE W REFLEX MICROSCOPIC - Abnormal; Notable for the following components:  ? Protein, ur 30 (*)   ? All other components within normal limits  ?URINALYSIS, MICROSCOPIC (REFLEX) - Abnormal; Notable for the following components:  ? Bacteria, UA RARE (*)   ? All other components within normal limits  ?RESP PANEL BY RT-PCR (FLU A&B, COVID) [id number]  ?CULTURE, BLOOD (ROUTINE X 2)  ?CULTURE, BLOOD (ROUTINE X 2)  ?URINE CULTURE  ?LIPASE, BLOOD  ?LACTIC ACID, PLASMA  ?PROTIME-INR  ?APTT  ? ? ?EKG ?EKG  Interpretation ? ?Date/Time:  Tuesday Aug 13 2021 12:16:59 EDT ?Ventricular Rate:  86 ?PR Interval:  175 ?QRS Duration: 100 ?QT Interval:  346 ?QTC Calculation: 414 ?R Axis:   104 ?Text Interpretation: Sinus rhythm Left atrial enlargement Left posterior fascicular block Abnormal R-wave progression, late transition Confirmed by Lennice Sites [phone]) on 08/13/2021 2:15:04 PM ? ?Radiology ?CT ABDOMEN PELVIS W CONTRAST ? ?Result Date: 08/13/2021 ?CLINICAL DATA:  Right groin abscess, concern for deep space infection. EXAM: CT ABDOMEN AND PELVIS WITH CONTRAST  TECHNIQUE: Multidetector CT imaging of the abdomen and pelvis was performed using the standard protocol following bolus administration of intravenous contrast. RADIATION DOSE REDUCTION: This exam was performed according to the departmental dose-optimization program which includes automated exposure control, adjustment of the mA and/or kV according to patient size and/or use of iterative reconstruction technique. CONTRAST:  162m OMNIPAQUE IOHEXOL 300 MG/ML  SOLN COMPARISON:  CT January 11, 2021 FINDINGS: Lower chest: No acute abnormality.  Prostatic mitral valve. Hepatobiliary: No suspicious hepatic lesion. Gallbladder is unremarkable. No biliary ductal dilation. Pancreas: No pancreatic ductal dilation or evidence of acute inflammation. 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 Left adrenal gland is not well visualized. Surgical changes of prior left nephrectomy without suspicious enhancing nodularity in the nephrectomy bed. Right kidney is unremarkable without hydronephrosis or suspicious renal mass. Mild wall thickening of an incompletely distended urinary bladder. Stomach/Bowel: Postsurgical changes prior  partial left colectomy. No radiopaque enteric contrast material was administered. No pathologic dilation of small or large bowel. Normal appendix. No evidence of acute bowel inflammation. Mild symmetric wall thickening of a nondistended portion of

## 2021-08-13 NOTE — Op Note (Signed)
08/13/2021 ? ?7:23 PM ? ?PATIENT:  [name]  56 y.o. male ? ?PRE-OPERATIVE DIAGNOSIS:  necrotizing fasciitis ? ?POST-OPERATIVE DIAGNOSIS:  necrotizing fasciitis of GROIN ? ?PROCEDURE:  Procedure(s): ?IRRIGATION AND DEBRIDEMENT WOUND RIGHT GROIN (Right) ? ?SURGEON:  Surgeon(s) and Role: ?   Ralene Ok, MD - Primary ? ?ANESTHESIA:   general ? ?EBL:  30cc  ? ?BLOOD ADMINISTERED:none ? ?DRAINS: Portion of Betadine soaked Kerlix in the right inguinal scrotal area ? ?LOCAL MEDICATIONS USED:  NONE ? ?SPECIMEN:  Source of Specimen: Right inguinal abscess ? ?DISPOSITION OF SPECIMEN: Microbiology ? ?COUNTS:  YES ? ?TOURNIQUET:  * No tourniquets in log * ? ?DICTATION: .Dragon Dictation ?Indication procedure: ?Patient is a 56 year old male, with a 24+ hour history of right groin pain with signs of infection.  Patient went CT scan was found to have gas-forming necrotizing soft tissue infection.  Patient was transferred [facility] and taken to the operating room urgently for debridement. ? ?Findings: ?Patient had a right inguinal area subcutaneous infection that was foul-smelling and necrotic appearing.  Patient did have subcutaneous emphysema that did track medially as well as inferiorly down through the spermatic cord structures.  This was down towards the testicle.  There was no further necrosis aside from a pocket of gross necrotic tissue.  The area was packed with Betadine soaked Kerlix. ? ?Details of procedure: ?After the patient was consented he was taken back to the OR and placed supine position with bilateral SCDs in place.  He underwent general tracheal intubation.  Patient was then prepped and draped in standard fashion.  A timeout was called and all facts were verified. ? ?An elliptical incision was made over the area of erythema.  Dissection was taken down to the midline.  There was a large amount of foul-smelling necrotic purulent fluid that was expressed.  Aerobic anaerobic cultures were  obtained.  At this time this was excised with cautery to maintain hemostasis.  Dissection taken down to the fascia and the external inguinal ring.  There appeared to be no further necrosis however there appeared to be subcu emphysema.  This did spread down towards the right testicle.  There appeared to be no further overt signs of infection that could be seen.  At this time the area was irrigated out with sterile saline.  The area was measured to be approximately 10 x 8 cm in size.  At this time Betadine soaked Kerlix was then placed into the wound.  This was dressed with 4 x 4's, ABD pad, and tape. ? ?Patient tolerated procedure well was taken to the recovery in stable condition. ? ?Excisional debridement: ? Tool used for debridement : scalpel and cautery ? ? Frequency of surgical debridement.   Initial debridement ? ? Measurement of total devitalized tissue (wound surface) before and after surgical debridement.   After surgical debridement 10 x 8 cm ? ? Area and depth of devitalized tissue removed from wound.  10 x 8 cm ? ? Blood loss and description of tissue removed.  30 cc, necrotic subcutaneous fat. ? ? Evidence of the progress of the wound's response to treatment. ? A.  Current wound volume (current dimensions and depth).  8 x 10 x 4 cm ? B.  Presence (and extent of) of infection.  Yes ? C.  Presence (and extent of) of non viable tissue.  Subcutaneous tissue that appeared to be contained to the abscess cavity. ? D.  Other material in the wound that is  expected to inhibit healing.  No ? ? Was there any viable tissue removed (measurements): No ? ? ? ?PLAN OF CARE: Admit to inpatient  ? ?PATIENT DISPOSITION:  PACU - hemodynamically stable. ?  ?Delay start of Pharmacological VTE agent (>24hrs) due to surgical blood loss or risk of bleeding: yes ? ?

## 2021-08-13 NOTE — Consult Note (Signed)
Reason for Consult: Soft tissue infection ?Referring Physician: Dr. Armandina Gemma ? ?[name] is an 56 y.o. male.  ?HPI: Patient is a 56 year old male, with history of diabetes, endocarditis, hypertension, prostate cancer who comes in with a 24+ hour history of right groin infection.  Patient states that he woke up Monday morning with pain to his right groin.  He states that this continued.  He states he experienced fevers and chills at the time.  Patient denies any nausea or vomiting.  Patient states that the fevers failed to regress.  He states he continues with pain to the right side however no signs of drainage.  Patient was then seen in the ER.  Upon evaluation the ER patient underwent CT scan.  This was significant for subcutaneous emphysema with signs of necrotizing soft tissue infection. ? ?Patient was transferred to [facility] for further evaluation and management. ?Patient did have an elevated WBC count, and was febrile. ? ?Patient states he is on no blood thinners.  He had previous strep infection.  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 ? ?Patient states currently with prostate cancer currently undergoing work-up and possible treatment. ? ?Patient does state that the right testicle is approximately one third the size it usually is secondary to radiation treatment as a child for his Wilms tumor. ? ?Past Medical History:  ?Diagnosis Date  ? Allergic rhinitis   ? Asthma   ? animal dander & seasonal asthma  ? Colon cancer ([place]) 01/26/2019  ? Diabetes mellitus without complication ([place])   ? dx 2010  type 2  ? Discitis of lumbosacral region   ? first started with this ..and rolled onto the endocarditis    stayed a month  ? Endocarditis of mitral valve   ? 11/2013 from back strep infection  ? Family history of colon cancer   ? Family history of thyroid cancer   ? H/O scoliosis   ? 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 History of hiatal hernia   ? Hyperlipidemia    ? Hypertension   ? Diagnostic exercise tolerance test assessment:04/30/2010 : comments normal -no evidence os ischemia by ST analysis  ? lipoma   ? Lipoma 01/26/2019  ? Mitral regurgitation   ? Echo 12/2018: EF 60-65, myxomatous mitral valve, severe mitral regurgitation, partial flail leaflet of posterior mitral valve, myxomatous tricuspid valve with trivial TR, ascending aorta mildly dilated (39 mm)  ? Pneumonia   ? as child  ? PONV (postoperative nausea and vomiting)   ? 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  ? Complex valvuloplasty including artificial Gore-tex neochord placement x6 with 28 mm Sorin Memo 4D ring annuloplasty via right mini thoracotomy approach  ? Solitary kidney   ? 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  ? ? ?Past Surgical History:  ?Procedure Laterality Date  ? ABDOMINAL AORTOGRAM N/A 02/10/2020  ? Procedure: ABDOMINAL AORTOGRAM;  Surgeon: Jettie Booze, MD;  Location: [facility];  Service: Cardiovascular;  Laterality: N/A;  ? BIOPSY  01/07/2019  ? Procedure: BIOPSY;  Surgeon: Wilford Corner, MD;  Location: [facility];  Service: Endoscopy;;  ? BIOPSY  01/03/2020  ? Procedure: BIOPSY;  Surgeon: Wilford Corner, MD;  Location: [facility];  Service: Endoscopy;;  ? COLON SURGERY  03/04/2019  ? left colon segmental resection  ? COLONOSCOPY WITH PROPOFOL N/A 01/07/2019  ? Procedure: COLONOSCOPY WITH PROPOFOL;  Surgeon: Wilford Corner, MD;  Location:  [facility];  Service: Endoscopy;  Laterality: N/A;  ? COLONOSCOPY WITH PROPOFOL N/A 01/03/2020  ? Procedure: COLONOSCOPY WITH PROPOFOL;  Surgeon: Wilford Corner, MD;  Location: [facility];  Service: Endoscopy;  Laterality: N/A;  ? INGUINAL HERNIA REPAIR Left 04/23/2016  ? Procedure: LAPAROSCOPIC  INGUINAL REPAIR;  Surgeon: Clovis Riley, MD;  Location: [facility];  Service: General;  Laterality: Left;  ? KNEE SURGERY    ? arthroscopic left knee  ? LIPOMA EXCISION  2015  ? x20 [facility]   ? MITRAL VALVE REPAIR Right 05/09/2020  ? Procedure: MINIMALLY INVASIVE MITRAL VALVE REPAIR (MVR) USING LIVANOVA MEMO 4D 28MM MITRAL RING;  Surgeon: [name], MD;  Location: [facility];  Service: Open Heart Surgery;  Laterality: Right;  ? POLYPECTOMY  01/03/2020  ? Procedure: POLYPECTOMY;  Surgeon: Wilford Corner, MD;  Location: [facility];  Service: Endoscopy;;  ? PORT-A-CATH REMOVAL    ? PORTACATH PLACEMENT N/A 03/16/2019  ? Procedure: INSERTION PORT-A-CATH;  Surgeon: Michael Boston, MD;  Location: [facility];  Service: General;  Laterality: N/A;  ? RIGHT/LEFT HEART CATH AND CORONARY ANGIOGRAPHY N/A 02/10/2020  ? Procedure: RIGHT/LEFT HEART CATH AND CORONARY ANGIOGRAPHY;  Surgeon: Jettie Booze, MD;  Location: [place] [facility];  Service: Cardiovascular;  Laterality: N/A;  ? SUBMUCOSAL INJECTION  01/07/2019  ? Procedure: SUBMUCOSAL INJECTION;  Surgeon: Wilford Corner, MD;  Location: [facility];  Service: Endoscopy;;  ? TEE WITHOUT CARDIOVERSION N/A 02/10/2020  ? Procedure: TRANSESOPHAGEAL ECHOCARDIOGRAM (TEE);  Surgeon: Acie Fredrickson Wonda Cheng, MD;  Location: [place];  Service: Cardiovascular;  Laterality: N/A;  CATH AFTER TEE  ? TEE WITHOUT CARDIOVERSION N/A 05/09/2020  ? Procedure: TRANSESOPHAGEAL ECHOCARDIOGRAM (TEE);  Surgeon: [name], MD;  Location: [facility];  Service: Open Heart Surgery;  Laterality: N/A;  ? THYROIDECTOMY, PARTIAL  01/05/2009  ? Right Thyroid Lobectomy  ? TONSILLECTOMY    ? TOTAL NEPHRECTOMY Left 1970  ? Wilms Tumor left kidney excised as an infant  ? ? ?Family History  ?Problem Relation Age of Onset  ? Heart disease Father   ? Hernia Father   ? Healthy Sister   ? Stroke Paternal Grandmother   ? Cancer Mother   ?     THYROID  ? Hypotension Mother   ? Heart attack Maternal Grandfather   ? Congestive Heart Failure Maternal Grandfather   ? Heart attack Paternal Grandfather   ? Colon cancer Maternal Aunt 79  ? Congestive Heart Failure Paternal Uncle   ? Congestive Heart Failure  Maternal Grandmother   ? Colon cancer Other 60  ?     MGMs brother  ? Colon cancer Other   ?     MGFs brother  ? ? ?Social History:  reports that he has never smoked. He has never used smokeless tobacco. He reports current alcohol use of about 1.0 standard drink per week. He reports that he does not use drugs. ? ?Allergies:  ?Allergies  ?Allergen Reactions  ? Empagliflozin   ?  Other reaction(s): Recurrent UTI  ? ? ?Medications: I have reviewed the patient's current medications. ? ?Results for orders placed or performed during the hospital encounter of 08/13/21 (from the past 48 hour(s))  ?CBC with Differential     Status: Abnormal  ? Collection Time: 08/13/21 12:35 PM  ?Result Value Ref Range  ? WBC 10.7 (H) 4.0 - 10.5 K/uL  ? RBC 4.70 4.22 - 5.81 MIL/uL  ? Hemoglobin 14.7 13.0 - 17.0 g/dL  ? HCT 43.8 39.0 -  52.0 %  ? MCV 93.2 80.0 - 100.0 fL  ? MCH 31.3 26.0 - 34.0 pg  ? MCHC 33.6 30.0 - 36.0 g/dL  ? RDW 12.7 11.5 - 15.5 %  ? Platelets 147 (L) 150 - 400 K/uL  ? nRBC 0.0 0.0 - 0.2 %  ? Neutrophils Relative % 85 %  ? Neutro Abs 9.1 (H) 1.7 - 7.7 K/uL  ? Lymphocytes Relative 6 %  ? Lymphs Abs 0.7 0.7 - 4.0 K/uL  ? Monocytes Relative 7 %  ? Monocytes Absolute 0.8 0.1 - 1.0 K/uL  ? Eosinophils Relative 1 %  ? Eosinophils Absolute 0.1 0.0 - 0.5 K/uL  ? Basophils Relative 0 %  ? Basophils Absolute 0.0 0.0 - 0.1 K/uL  ? Immature Granulocytes 1 %  ? Abs Immature Granulocytes 0.05 0.00 - 0.07 K/uL  ?  Comment: Performed at [facility], [address] [place], [place] [address]  ?Comprehensive metabolic panel     Status: Abnormal  ? Collection Time: 08/13/21 12:35 PM  ?Result Value Ref Range  ? Sodium 133 (L) 135 - 145 mmol/L  ? Potassium 4.3 3.5 - 5.1 mmol/L  ? Chloride 100 98 - 111 mmol/L  ? CO2 25 22 - 32 mmol/L  ? Glucose, Bld 203 (H) 70 - 99 mg/dL  ?  Comment: Glucose reference range applies only to samples taken after fasting for at least 8 hours.  ? BUN 20 6 - 20 mg/dL  ? Creatinine, Ser 1.07 0.61 - 1.24  mg/dL  ? Calcium 9.2 8.9 - 10.3 mg/dL  ? Total Protein 7.2 6.5 - 8.1 g/dL  ? Albumin 3.7 3.5 - 5.0 g/dL  ? AST 30 15 - 41 U/L  ? ALT 30 0 - 44 U/L  ? Alkaline Phosphatase 45 38 - 126 U/L  ? Total Bilirub

## 2021-08-13 NOTE — ED Triage Notes (Signed)
Pt reports he woke up 4am yesterday with fever and chills. Later that morning started with right lower abdominal pain and knot right groin.. Noticed drainage this morning ?

## 2021-08-13 NOTE — Anesthesia Preprocedure Evaluation (Addendum)
Anesthesia Evaluation  ?Patient identified by MRN, date of birth, ID band ?Patient awake ? ? ? ?Reviewed: ?Allergy & Precautions, NPO status , Patient's Chart, lab work & pertinent test results ? ?History of Anesthesia Complications ?(+) PONV and history of anesthetic complications ? ?Airway ?Mallampati: I ? ?TM Distance: >3 FB ?Neck ROM: Full ? ? ? Dental ? ?(+) Teeth Intact, Dental Advisory Given ?  ?Pulmonary ?asthma ,  ?  ?breath sounds clear to auscultation ? ? ? ? ? ? Cardiovascular ?hypertension, + Valvular Problems/Murmurs MR  ?Rhythm:Regular Rate:Normal ? ?Echo: ?1. Left ventricular ejection fraction, by estimation, is 55 to 60%. The  ?left ventricle has normal function. The left ventricle has no regional  ?wall motion abnormalities. Left ventricular diastolic parameters are  ?consistent with Grade II diastolic  ?dysfunction (pseudonormalization). Elevated left atrial pressure.  ??2. Right ventricular systolic function is low normal. The right  ?ventricular size is mildly enlarged. There is normal pulmonary artery  ?systolic pressure.  ??3. Left atrial size was moderately dilated.  ??4. Right atrial size was mildly dilated.  ??5. S/p MV valvuloplasty with chord placement and 28 mm Sorin Memo 4D  ?annuloplasty ring (05/09/20). Peak and men gradients through the valve are  ?11 and 6 mm Hg respectively. MVA (VTI) is 1.7 cm2. Trivial mitral valve  ?regurgitation.  ??6. The aortic valve is tricuspid. Aortic valve regurgitation is not  ?visualized. Mild aortic valve sclerosis is present, with no evidence of  ?aortic valve stenosis.  ??7. Aortic dilatation noted. There is mild dilatation of the ascending  ?aorta, measuring 41 mm.  ??8. The inferior vena cava is normal in size with greater than 50%  ?respiratory variability, suggesting right atrial pressure of 3 mmHg.  ?  ?Neuro/Psych ?negative neurological ROS ? negative psych ROS  ? GI/Hepatic ?Neg liver ROS, hiatal hernia,    ?Endo/Other  ?diabetes, Type 2, Insulin Dependent, Oral Hypoglycemic Agents ? Renal/GU ?Renal disease  ? ?  ?Musculoskeletal ? ?(+) Arthritis ,  ? Abdominal ?Normal abdominal exam  (+)   ?Peds ? Hematology ?negative hematology ROS ?(+)   ?Anesthesia Other Findings ? ? Reproductive/Obstetrics ? ?  ? ? ? ? ? ? ? ? ? ? ? ? ? ?  ?  ? ? ? ? ? ? ? ?Anesthesia Physical ?Anesthesia Plan ? ?ASA: 3 and emergent ? ?Anesthesia Plan: General  ? ?Post-op Pain Management:   ? ?Induction: Intravenous ? ?PONV Risk Score and Plan: 4 or greater and Ondansetron, Dexamethasone, Midazolam, Treatment may vary due to age or medical condition and Scopolamine patch - Pre-op ? ?Airway Management Planned: Oral ETT ? ?Additional Equipment: None ? ?Intra-op Plan:  ? ?Post-operative Plan: Extubation in OR ? ?Informed Consent: I have reviewed the patients History and Physical, chart, labs and discussed the procedure including the risks, benefits and alternatives for the proposed anesthesia with the patient or authorized representative who has indicated his/her understanding and acceptance.  ? ? ? ?Dental advisory given ? ?Plan Discussed with: CRNA ? ?Anesthesia Plan Comments:   ? ? ? ? ? ?Anesthesia Quick Evaluation ? ?

## 2021-08-13 NOTE — Anesthesia Procedure Notes (Signed)
Procedure Name: Intubation ?Date/Time: 08/13/2021 7:00 PM ?Performed by: Reece Agar, CRNA ?Pre-anesthesia Checklist: Patient identified, Emergency Drugs available, Suction available and Patient being monitored ?Patient Re-evaluated:Patient Re-evaluated prior to induction ?Oxygen Delivery Method: Circle System Utilized ?Preoxygenation: Pre-oxygenation with 100% oxygen ?Induction Type: IV induction, Rapid sequence and Cricoid Pressure applied ?Ventilation: Mask ventilation without difficulty ?Laryngoscope Size: Mac and 4 ?Grade View: Grade I ?Tube type: Oral ?Tube size: 7.5 mm ?Number of attempts: 1 ?Airway Equipment and Method: Stylet ?Placement Confirmation: ETT inserted through vocal cords under direct vision, positive ETCO2 and breath sounds checked- equal and bilateral ?Secured at: 23 cm ?Tube secured with: Tape ?Dental Injury: Teeth and Oropharynx as per pre-operative assessment  ? ? ? ? ?

## 2021-08-13 NOTE — Progress Notes (Signed)
Pharmacy Antibiotic Note ? ?[name] is a 56 y.o. male admitted on 08/13/2021 with cellulitis/wound infection.  Pharmacy has been consulted for vancomycin dosing. Also initiated on cefepime and metronidazole per EDP. SCr 1.07 on presentation (most recent baseline SCr 0.96 on 01/11/2021). ? ?Plan: ?Cefepime 2g IV q8h ?Metronidazole '500mg'$  IV q12h ?Vancomycin 2g (using 1g + 1g for total 2g load due to product limitations at [facility]) IV x 1; then '1250mg'$  IV q12h. Goal AUC 400-550. ?Expected AUC: 469 ?SCr used: 1.07 ?Monitor clinical progress, c/s, renal function ?F/u de-escalation plan/LOT, vancomycin levels as indicated ? ? ?Height: 6' (182.9 cm) ?Weight: 97.1 kg (214 lb) ?IBW/kg (Calculated) : 77.6 ? ?Temp (24hrs), Avg:99.7 ?F (37.6 ?C), Min:99.7 ?F (37.6 ?C), Max:99.7 ?F (37.6 ?C) ? ?Recent Labs  ?Lab 08/13/21 ?1235  ?WBC 10.7*  ?CREATININE 1.07  ?LATICACIDVEN 2.3*  ?  ?Estimated Creatinine Clearance: 95.3 mL/min (by C-G formula based on SCr of 1.07 mg/dL).   ? ?Allergies  ?Allergen Reactions  ? Empagliflozin   ?  Other reaction(s): Recurrent UTI  ? ? ?Arturo Morton, PharmD, BCPS ?Please check AMION for all [place] contact numbers ?Clinical Pharmacist ?08/13/2021 2:23 PM ? ?

## 2021-08-13 NOTE — Transfer of Care (Signed)
Immediate Anesthesia Transfer of Care Note ? ?Patient: [name] ? ?Procedure(s) Performed: IRRIGATION AND DEBRIDEMENT WOUND RIGHT GROIN (Right: Groin) ? ?Patient Location: PACU ? ?Anesthesia Type:General ? ?Level of Consciousness: awake and alert  ? ?Airway & Oxygen Therapy: Patient Spontanous Breathing and Patient connected to face mask oxygen ? ?Post-op Assessment: Report given to RN and Post -op Vital signs reviewed and stable ? ?Post vital signs: Reviewed and stable ? ?Last Vitals:  ?Vitals Value Taken Time  ?BP 159/92 08/13/21 1945  ?Temp 36.3 ?C 08/13/21 1945  ?Pulse 94 08/13/21 1951  ?Resp 20 08/13/21 1951  ?SpO2 98 % 08/13/21 1951  ?Vitals shown include unvalidated device data. ? ?Last Pain:  ?Vitals:  ? 08/13/21 1818  ?TempSrc:   ?PainSc: 4   ?   ? ?  ? ?Complications: No notable events documented. ?

## 2021-08-13 NOTE — ED Notes (Signed)
Received patient from [place] had cellulitis on Monday morning.  Pain 4/10 A&)x 4 140/70 HR 80 GCS 15 98%RA ?

## 2021-08-13 NOTE — ED Provider Notes (Signed)
?[facility] ?Provider Note ? ? ?CSN: [id number] ?Arrival date & time: 08/13/21  1156 ? ?  ? ?History ? ?Chief Complaint  ?Patient presents with  ? Abdominal Pain  ? ? ?[name] is a 56 y.o. male. ? ? ?Abdominal Pain ? ?56 year old male received in transfer from [place] emergency department with concern for necrotizing fasciitis.  The patient has a history of endocarditis status post mitral valve repair, history of bacteremia, multiple lipomas and has a history of diabetes.  He has an infectious process in his right lower abdomen and CT scan showed concern for necrotizing infection in the right inguinal region.  The patient is febrile to 102.3, is status post IV vancomycin, IV cefepime, IV Flagyl.  He was transferred for general surgical consultation for likely operative procedure in the setting of necrotizing fasciitis.  CT imaging did reveal subcutaneous emphysema tracking through the right inguinal canal with no walled off fluid collections to suggest abscess.  This was suspicious for gas-forming bacteria. ? ?Home Medications ?Prior to Admission medications   ?Medication Sig Start Date End Date Taking? Authorizing Provider  ?albuterol (PROVENTIL HFA;VENTOLIN HFA) 108 (90 BASE) MCG/ACT inhaler Inhale 1 puff into the lungs every 6 (six) hours as needed for wheezing or shortness of breath.    [provider]  ?aspirin EC 81 MG EC tablet Take 1 tablet (81 mg total) by mouth daily. Swallow whole. 05/15/20   Antony Odea, PA-C  ?atorvastatin (LIPITOR) 10 MG tablet Take 1 tablet (10 mg total) by mouth daily. 03/15/14   Angiulli, Lavon Paganini, PA-C  ?calcium carbonate (TUMS - DOSED IN MG ELEMENTAL CALCIUM) 500 MG chewable tablet Chew 500 mg by mouth daily as needed for indigestion or heartburn.     [provider]  ?fluticasone (FLONASE) 50 MCG/ACT nasal spray Place 1 spray into both nostrils daily as needed for allergies or rhinitis.     [provider]  ?insulin glargine, 1 Unit Dial, (TOUJEO SOLOSTAR) 300 UNIT/ML Solostar Pen 26 units 04/05/21   [provider]  ?lisinopril (ZESTRIL) 2.5 MG tablet Take 2.5 mg by mouth daily. ?Patient not taking: Reported on 07/19/2021 07/17/21   [provider]  ?loratadine (CLARITIN) 10 MG tablet Take 10 mg by mouth daily as needed for allergies.    [provider]  ?metFORMIN (GLUCOPHAGE) 1000 MG tablet Take 1 tablet (1,000 mg total) by mouth 2 (two) times daily. 02/12/20   Jettie Booze, MD  ?mometasone [facility]) 220 MCG/INH inhaler Inhale 1 puff into the lungs daily.    [provider]  ?ONE TOUCH ULTRA TEST test strip 1 each by Other route as needed (GLUCOSE).  05/06/15   [provider]  ?pioglitazone (ACTOS) 30 MG tablet Take 1 tablet by mouth daily.    [provider]  ?repaglinide (PRANDIN) 2 MG tablet Take 2 mg by mouth 2 (two) times daily. 05/16/20   [provider]  ?rivaroxaban (XARELTO) 20 MG TABS tablet Take 1 tablet (20 mg total) by mouth daily with supper. 10/10/20   Jettie Booze, MD  ?TOPROL XL 25 MG 24 hr tablet TAKE 1 TABLET BY MOUTH TWICE A DAY 11/30/20   Jettie Booze, MD  ?   ? ?Allergies    ?Empagliflozin   ? ?Review of Systems   ?Review of Systems  ?Unable to perform ROS: Acuity of condition  ?Gastrointestinal:  Positive for abdominal pain.  ? ?Physical Exam ?Updated Vital Signs ?  BP 140/80 (BP Location: Right Arm)   Pulse 89   Temp (!) 100.8 ?F (38.2 ?C) (Oral)   Resp 19   Ht 6' (1.829 m)   Wt 97.1 kg   SpO2 100%   BMI 29.02 kg/m?  ?Physical Exam ?Vitals and nursing note reviewed.  ?Constitutional:   ?   General: He is not in acute distress. ?HENT:  ?   Head: Normocephalic and atraumatic.  ?Eyes:  ?   Conjunctiva/sclera: Conjunctivae normal.  ?   Pupils: Pupils are equal, round, and reactive to light.  ?Cardiovascular:  ?   Rate and Rhythm: Normal rate and regular rhythm.  ?Pulmonary:  ?   Effort:  Pulmonary effort is normal. No respiratory distress.  ?Abdominal:  ?   General: There is no distension.  ?   Tenderness: There is abdominal tenderness in the right lower quadrant. There is no guarding.  ?Musculoskeletal:     ?   General: No deformity or signs of injury.  ?   Cervical back: Neck supple.  ?Skin: ?   Findings: No lesion or rash.  ?Neurological:  ?   General: No focal deficit present.  ?   Mental Status: He is alert. Mental status is at baseline.  ? ? ?ED Results / Procedures / Treatments   ?Labs ?(all labs ordered are listed, but only abnormal results are displayed) ?Labs Reviewed  ?CBC WITH DIFFERENTIAL/PLATELET - Abnormal; Notable for the following components:  ?    Result Value  ? WBC 10.7 (*)   ? Platelets 147 (*)   ? Neutro Abs 9.1 (*)   ? All other components within normal limits  ?COMPREHENSIVE METABOLIC PANEL - Abnormal; Notable for the following components:  ? Sodium 133 (*)   ? Glucose, Bld 203 (*)   ? Total Bilirubin 1.9 (*)   ? All other components within normal limits  ?LACTIC ACID, PLASMA - Abnormal; Notable for the following components:  ? Lactic Acid, Venous 2.3 (*)   ? All other components within normal limits  ?URINALYSIS, ROUTINE W REFLEX MICROSCOPIC - Abnormal; Notable for the following components:  ? Protein, ur 30 (*)   ? All other components within normal limits  ?URINALYSIS, MICROSCOPIC (REFLEX) - Abnormal; Notable for the following components:  ? Bacteria, UA RARE (*)   ? All other components within normal limits  ?RESP PANEL BY RT-PCR (FLU A&B, COVID) [id number]  ?CULTURE, BLOOD (ROUTINE X 2)  ?CULTURE, BLOOD (ROUTINE X 2)  ?URINE CULTURE  ?LIPASE, BLOOD  ?LACTIC ACID, PLASMA  ?PROTIME-INR  ?APTT  ? ? ?EKG ?EKG Interpretation ? ?Date/Time:  Tuesday Aug 13 2021 12:16:59 EDT ?Ventricular Rate:  86 ?PR Interval:  175 ?QRS Duration: 100 ?QT Interval:  346 ?QTC Calculation: 414 ?R Axis:   104 ?Text Interpretation: Sinus rhythm Left atrial enlargement Left posterior fascicular block  Abnormal R-wave progression, late transition Confirmed by Lennice Sites [phone]) on 08/13/2021 2:15:04 PM ? ?Radiology ?CT ABDOMEN PELVIS W CONTRAST ? ?Result Date: 08/13/2021 ?CLINICAL DATA:  Right groin abscess, concern for deep space infection. EXAM: CT ABDOMEN AND PELVIS WITH CONTRAST TECHNIQUE: Multidetector CT imaging of the abdomen and pelvis was performed using the standard protocol following bolus administration of intravenous contrast. RADIATION DOSE REDUCTION: This exam was performed according to the departmental dose-optimization program which includes automated exposure control, adjustment of the mA and/or kV according to patient size and/or use of iterative reconstruction technique. CONTRAST:  123m OMNIPAQUE IOHEXOL 300 MG/ML  SOLN COMPARISON:  CT January 11, 2021 FINDINGS: Lower chest: No acute abnormality.  Prostatic mitral valve. Hepatobiliary: No suspicious hepatic lesion. Gallbladder is unremarkable. No biliary ductal dilation. Pancreas: No pancreatic ductal dilation or evidence of acute inflammation. 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 Left adrenal gland is not well visualized. Surgical changes of prior left nephrectomy without suspicious enhancing nodularity in the nephrectomy bed. Right kidney is unremarkable without hydronephrosis or suspicious renal mass. Mild wall thickening of an incompletely distended urinary bladder. Stomach/Bowel: Postsurgical changes prior partial left colectomy. No radiopaque enteric contrast material was administered. No pathologic dilation of small or large bowel. Normal appendix. No evidence of acute bowel inflammation. Mild symmetric wall thickening of a nondistended portion of rectum for instance on  axial image 73 and sagittal image 78. Vascular/Lymphatic: Aortic and branch vessel atherosclerosis without abdominal aortic aneurysm. No pathologically enlarged abdominal or pelvic lymph nodes. Reproductive: Enlarged pro

## 2021-08-13 NOTE — H&P (Signed)
?History and Physical  ? ? ?[name] HUT:[id number] DOB: 10/10/1965 DOA: 08/13/2021 ? ?PCP: Orpah Melter, MD  ?Patient coming from: Home ? ?I have personally briefly reviewed patient's old medical records in [place] ? ?Chief Complaint: Right groin infection ? ?HPI: ?[name] is a 56 y.o. 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 ? ?Patient states he awoke morning of 5/15 with chills.  He noticed swelling and pain to his right groin area.  He noticed some purulent discharge from the site.  He did Tylenol with some transient relief however since then has been having increasing swelling, erythema, and pain in his right groin area.  He has been having frequent fevers and chills.  Due to concern of worsening infection he came to the ED for further evaluation. ? ?Patient seen in PACU postoperatively after I&D to right groin by general surgery, Dr. Rosendo Gros.  He is feeling improved with no further complaints. ? ?ED Course  Labs/Imaging on admission: I have personally reviewed following labs and imaging studies. ? ?Initial vitals showed BP 165/105, pulse 87, RR 21, temp 99.7 ?F, SPO2 99% on room air.  Tmax 1 to 2.5 ?F. ? ?Labs show WBC 10.7, hemoglobin 14.7, platelets 147,000, sodium 133, potassium 4.3, bicarb 25, BUN 20, creatinine 1.07, serum glucose 203, AST 30, ALT 30, alk phos 45, total bilirubin 1.9, lipase 33, lactic acid 2.3 > 1.6. ? ?Blood cultures in process.  Urinalysis negative for UTI.  SARS-CoV-2 and influenza PCR negative. ? ?CT abdomen/pelvis with contrast showed subcutaneous emphysema tracking through  stranding/fluid in the right inguinal canal suspicious for infectious/cellulitis with gas-forming bacteria. ? ?Patient was sent ED to ED transfer from [facility] to [facility]  Patient seen by general surgery on arrival and took to the OR for management of necrotizing fasciitis of right groin.  The hospitalist service was consulted to admit for further evaluation and management. Patient has received IV vancomycin, cefepime, Flagyl, and linezolid. ? ?Review of Systems: All systems reviewed and are negative except as documented in history of present illness above. ? ? ?Past Medical History:  ?Diagnosis Date  ? Allergic rhinitis   ? Asthma   ? animal dander & seasonal asthma  ? Colon cancer ([place]) 01/26/2019  ? Diabetes mellitus without complication ([place])   ? dx 2010  type 2  ? Discitis of lumbosacral region   ? first started with this ..and rolled onto the endocarditis    stayed a month  ? Endocarditis of mitral valve   ? 11/2013 from back strep infection  ? Family history of colon cancer   ? Family history of thyroid cancer   ? H/O scoliosis   ? 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 History of hiatal hernia   ? Hyperlipidemia   ? Hypertension   ? Diagnostic exercise tolerance test assessment:04/30/2010 : comments normal -no evidence os ischemia by ST analysis  ? lipoma   ? Lipoma 01/26/2019  ? Mitral regurgitation   ? Echo 12/2018: EF 60-65, myxomatous mitral valve, severe mitral regurgitation, partial flail leaflet of posterior mitral valve, myxomatous tricuspid valve with trivial TR, ascending aorta mildly dilated (39 mm)  ?  Pneumonia   ? as child  ? PONV (postoperative nausea and vomiting)   ? 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  ? Complex valvuloplasty including artificial Gore-tex neochord placement x6 with 28 mm Sorin Memo 4D ring annuloplasty via right mini thoracotomy approach  ? Solitary kidney   ? 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  ? ? ?Past  Surgical History:  ?Procedure Laterality Date  ? ABDOMINAL AORTOGRAM N/A 02/10/2020  ? Procedure: ABDOMINAL AORTOGRAM;  Surgeon: David Booze, MD;  Location: [facility];  Service: Cardiovascular;  Laterality: N/A;  ? BIOPSY  01/07/2019  ? Procedure: BIOPSY;  Surgeon: David Corner, MD;  Location: [facility];  Service: Endoscopy;;  ? BIOPSY  01/03/2020  ? Procedure: BIOPSY;  Surgeon: David Corner, MD;  Location: [facility];  Service: Endoscopy;;  ? COLON SURGERY  03/04/2019  ? left colon segmental resection  ? COLONOSCOPY WITH PROPOFOL N/A 01/07/2019  ? Procedure: COLONOSCOPY WITH PROPOFOL;  Surgeon: David Corner, MD;  [facility]: [facility];  Service: Endoscopy;  Laterality: N/A;  ? COLONOSCOPY WITH PROPOFOL N/A 01/03/2020  ? Procedure: COLONOSCOPY WITH PROPOFOL;  Surgeon: David Corner, MD;  [facility]: [facility];  Service: Endoscopy;  Laterality: N/A;  ? INGUINAL HERNIA REPAIR Left 04/23/2016  ? Procedure: LAPAROSCOPIC  INGUINAL REPAIR;  Surgeon: David Riley, MD;  Location: [facility];  Service: General;  Laterality: Left;  ? KNEE SURGERY    ? arthroscopic left knee  ? LIPOMA EXCISION  2015  ? x20 [facility]  ? MITRAL VALVE REPAIR Right 05/09/2020  ? Procedure: MINIMALLY INVASIVE MITRAL VALVE REPAIR (MVR) USING LIVANOVA MEMO 4D 28MM MITRAL RING;  Surgeon: [name], MD;  Location: [facility];  Service: Open Heart Surgery;  Laterality: Right;  ? POLYPECTOMY  01/03/2020  ? Procedure: POLYPECTOMY;  Surgeon: David Corner, MD;  Location: [facility];  Service: Endoscopy;;  ? PORT-A-CATH REMOVAL    ? PORTACATH PLACEMENT N/A 03/16/2019  ? Procedure: INSERTION PORT-A-CATH;  Surgeon: David Boston, MD;  Location: [facility];  Service: General;  Laterality: N/A;  ? RIGHT/LEFT HEART CATH AND CORONARY ANGIOGRAPHY N/A 02/10/2020  ? Procedure: RIGHT/LEFT HEART CATH AND CORONARY ANGIOGRAPHY;  Surgeon: David Booze, MD;  Location: [facility];  Service: Cardiovascular;  Laterality:  N/A;  ? SUBMUCOSAL INJECTION  01/07/2019  ? Procedure: SUBMUCOSAL INJECTION;  Surgeon: David Corner, MD;  [facility];  Service: Endoscopy;;  ? TEE WITHOUT CARDIOVERSION N/A 02/10/2020  ? Procedure: TRANSESOPHAGEAL ECHOCARDIOGRAM (TEE);  Surgeon: Acie Fredrickson Wonda Cheng, MD;  Location: [facility];  Service: Cardiovascular;  Laterality: N/A;  CATH AFTER TEE  ? TEE WITHOUT CARDIOVERSION N/A 05/09/2020  ? Procedure: TRANSESOPHAGEAL ECHOCARDIOGRAM (TEE);  Surgeon: [name], MD;  Location: [facility];  Service: Open Heart Surgery;  Laterality: N/A;  ? THYROIDECTOMY, PARTIAL  01/05/2009  ? Right Thyroid Lobectomy  ? TONSILLECTOMY    ? TOTAL NEPHRECTOMY Left 1970  ? Wilms Tumor left kidney excised as an infant  ? ? ?Social History: ? reports that he has never smoked. He has never used smokeless tobacco. He reports current alcohol use of about 1.0 standard drink per week. He reports that he does not use drugs. ? ?Allergies  ?Allergen Reactions  ? Empagliflozin   ?  Other reaction(s): Recurrent UTI  ? ? ?Family History  ?Problem Relation Age of Onset  ? Heart disease Father   ? Hernia Father   ? Healthy Sister   ?  Stroke Paternal Grandmother   ? Cancer Mother   ?     THYROID  ? Hypotension Mother   ? Heart attack Maternal Grandfather   ? Congestive Heart Failure Maternal Grandfather   ? Heart attack Paternal Grandfather   ? Colon cancer Maternal Aunt 79  ? Congestive Heart Failure Paternal Uncle   ? Congestive Heart Failure Maternal Grandmother   ? Colon cancer Other 30  ?     MGMs brother  ? Colon cancer Other   ?     MGFs brother  ? ? ? ?Prior to Admission medications   ?Medication Sig Start Date End Date Taking? Authorizing Provider  ?aspirin EC 81 MG EC tablet Take 1 tablet (81 mg total) by mouth daily. Swallow whole. 05/15/20  Yes Roddenberry, Arlis Porta, PA-C  ?insulin glargine, 1 Unit Dial, (TOUJEO SOLOSTAR) 300 UNIT/ML Solostar Pen 26 units 04/05/21  Yes [provider]  ?metFORMIN (GLUCOPHAGE) 1000 MG  tablet Take 1 tablet (1,000 mg total) by mouth 2 (two) times daily. 02/12/20  Yes David Booze, MD  ?TOPROL XL 25 MG 24 hr tablet TAKE 1 TABLET BY MOUTH TWICE A DAY 11/30/20  Yes David Booze, MD

## 2021-08-13 NOTE — Sepsis Progress Note (Signed)
[facility] following code sepsis °

## 2021-08-14 ENCOUNTER — Encounter (HOSPITAL_COMMUNITY): Payer: Self-pay | Admitting: General Surgery

## 2021-08-14 DIAGNOSIS — E782 Mixed hyperlipidemia: Secondary | ICD-10-CM | POA: Diagnosis not present

## 2021-08-14 DIAGNOSIS — I10 Essential (primary) hypertension: Secondary | ICD-10-CM

## 2021-08-14 DIAGNOSIS — E119 Type 2 diabetes mellitus without complications: Secondary | ICD-10-CM | POA: Diagnosis not present

## 2021-08-14 DIAGNOSIS — M7989 Other specified soft tissue disorders: Secondary | ICD-10-CM | POA: Diagnosis not present

## 2021-08-14 LAB — CBC
HCT: 40.8 % (ref 39.0–52.0)
Hemoglobin: 13.8 g/dL (ref 13.0–17.0)
MCH: 31.9 pg (ref 26.0–34.0)
MCHC: 33.8 g/dL (ref 30.0–36.0)
MCV: 94.2 fL (ref 80.0–100.0)
Platelets: 126 10*3/uL — ABNORMAL LOW (ref 150–400)
RBC: 4.33 MIL/uL (ref 4.22–5.81)
RDW: 12.7 % (ref 11.5–15.5)
WBC: 11 10*3/uL — ABNORMAL HIGH (ref 4.0–10.5)
nRBC: 0 % (ref 0.0–0.2)

## 2021-08-14 LAB — COMPREHENSIVE METABOLIC PANEL
ALT: 26 U/L (ref 0–44)
AST: 28 U/L (ref 15–41)
Albumin: 2.9 g/dL — ABNORMAL LOW (ref 3.5–5.0)
Alkaline Phosphatase: 39 U/L (ref 38–126)
Anion gap: 7 (ref 5–15)
BUN: 13 mg/dL (ref 6–20)
CO2: 25 mmol/L (ref 22–32)
Calcium: 8.7 mg/dL — ABNORMAL LOW (ref 8.9–10.3)
Chloride: 103 mmol/L (ref 98–111)
Creatinine, Ser: 1.07 mg/dL (ref 0.61–1.24)
GFR, Estimated: >60 mL/min (ref >60)
Glucose, Bld: 183 mg/dL — ABNORMAL HIGH (ref 70–99)
Potassium: 4.5 mmol/L (ref 3.5–5.1)
Sodium: 135 mmol/L (ref 135–145)
Total Bilirubin: 1.4 mg/dL — ABNORMAL HIGH (ref 0.3–1.2)
Total Protein: 6 g/dL — ABNORMAL LOW (ref 6.5–8.1)

## 2021-08-14 LAB — URINE CULTURE: Culture: NO GROWTH

## 2021-08-14 LAB — GLUCOSE, CAPILLARY
Glucose-Capillary: 160 mg/dL — ABNORMAL HIGH (ref 70–99)
Glucose-Capillary: 180 mg/dL — ABNORMAL HIGH (ref 70–99)
Glucose-Capillary: 205 mg/dL — ABNORMAL HIGH (ref 70–99)
Glucose-Capillary: 212 mg/dL — ABNORMAL HIGH (ref 70–99)
Glucose-Capillary: 242 mg/dL — ABNORMAL HIGH (ref 70–99)
Glucose-Capillary: 261 mg/dL — ABNORMAL HIGH (ref 70–99)

## 2021-08-14 LAB — HEMOGLOBIN A1C
Hgb A1c MFr Bld: 7 % — ABNORMAL HIGH (ref 4.8–5.6)
Mean Plasma Glucose: 154.2 mg/dL

## 2021-08-14 LAB — HIV ANTIBODY (ROUTINE TESTING W REFLEX): HIV Screen 4th Generation wRfx: NONREACTIVE

## 2021-08-14 MED ORDER — MOMETASONE FURO-FORMOTEROL FUM 200-5 MCG/ACT IN AERO
1.0000 | INHALATION_SPRAY | Freq: Every day | RESPIRATORY_TRACT | Status: AC
  Administered 2021-08-14 – 2021-08-15 (×2): 1 via RESPIRATORY_TRACT
  Filled 2021-08-14: qty 8.8

## 2021-08-14 MED ORDER — INSULIN GLARGINE-YFGN 100 UNIT/ML ~~LOC~~ SOLN
15.0000 [IU] | Freq: Every day | SUBCUTANEOUS | Status: DC
  Administered 2021-08-14 – 2021-08-17 (×4): 15 [IU] via SUBCUTANEOUS
  Filled 2021-08-14 (×4): qty 0.15

## 2021-08-14 NOTE — Assessment & Plan Note (Signed)
Following with oncology, Dr. Benay Spice.  In remission per most recent oncology note. ?

## 2021-08-14 NOTE — Anesthesia Postprocedure Evaluation (Addendum)
Anesthesia Post Note ? ?Patient: [name] ? ?Procedure(s) Performed: IRRIGATION AND DEBRIDEMENT WOUND RIGHT GROIN (Right: Groin) ? ?  ? ?Patient location during evaluation: PACU ?Anesthesia Type: General ?Level of consciousness: awake and alert ?Pain management: pain level controlled ?Vital Signs Assessment: post-procedure vital signs reviewed and stable ?Respiratory status: spontaneous breathing, nonlabored ventilation, respiratory function stable and patient connected to nasal cannula oxygen ?Cardiovascular status: blood pressure returned to baseline and stable ?Postop Assessment: no apparent nausea or vomiting ?Anesthetic complications: no ? ? ?No notable events documented. ? ?  ?  ?  ?  ?  ?  ? ?[name] ? ? ? ? ?

## 2021-08-14 NOTE — Progress Notes (Signed)
? ? ?1 Day Post-Op  ?Subjective: ?CC: ?Doing well. Soreness at the incision of the right groin. Voiding. Has not gotten oob.  ? ?Objective: ?Vital signs in last 24 hours: ?Temp:  [97.3 ?F (36.3 ?C)-102.5 ?F (39.2 ?C)] 98.1 ?F (36.7 ?C) (05/17 7564) ?Pulse Rate:  [65-95] 68 (05/17 0748) ?Resp:  [16-21] 18 (05/17 0748) ?BP: (117-165)/(70-105) 130/85 (05/17 0748) ?SpO2:  [94 %-100 %] 99 % (05/17 0748) ?Weight:  [97.1 kg-100.1 kg] 97.1 kg (05/17 0602) ?Last BM Date : 08/13/21 ? ?Intake/Output from previous day: ?05/16 0701 - 05/17 0700 ?In: 1640 [P.O.:240; I.V.:900; IV Piggyback:500] ?Out: 1370 [Urine:1350; Blood:20] ?Intake/Output this shift: ?No intake/output data recorded. ? ?PE: ?Gen:  Alert, NAD, pleasant ?Pulm: Rate and effort normal ?Abd: Soft, ND, NT,  ?GU: R groin incision w/ packing in place - c/d/I ? ?Lab Results:  ?Recent Labs  ?  08/13/21 ?1235 08/14/21 ?3329  ?WBC 10.7* 11.0*  ?HGB 14.7 13.8  ?HCT 43.8 40.8  ?PLT 147* 126*  ? ?BMET ?Recent Labs  ?  08/13/21 ?1235 08/14/21 ?5188  ?NA 133* 135  ?K 4.3 4.5  ?CL 100 103  ?CO2 25 25  ?GLUCOSE 203* 183*  ?BUN 20 13  ?CREATININE 1.07 1.07  ?CALCIUM 9.2 8.7*  ? ?PT/INR ?Recent Labs  ?  08/13/21 ?1235  ?LABPROT 14.2  ?INR 1.1  ? ?CMP  ?   ?Component Value Date/Time  ? NA 135 08/14/2021 0536  ? NA 136 02/08/2020 1128  ? K 4.5 08/14/2021 0536  ? CL 103 08/14/2021 0536  ? CO2 25 08/14/2021 0536  ? GLUCOSE 183 (H) 08/14/2021 0536  ? BUN 13 08/14/2021 0536  ? BUN 24 02/08/2020 1128  ? CREATININE 1.07 08/14/2021 0536  ? CREATININE 0.96 01/11/2021 0814  ? CREATININE 1.07 01/04/2015 0815  ? CALCIUM 8.7 (L) 08/14/2021 0536  ? PROT 6.0 (L) 08/14/2021 0536  ? ALBUMIN 2.9 (L) 08/14/2021 0536  ? AST 28 08/14/2021 0536  ? AST 53 (H) 08/22/2019 1118  ? ALT 26 08/14/2021 0536  ? ALT 61 (H) 08/22/2019 1118  ? ALKPHOS 39 08/14/2021 0536  ? BILITOT 1.4 (H) 08/14/2021 0536  ? BILITOT 1.0 08/22/2019 1118  ? GFRNONAA >60 08/14/2021 0536  ? GFRNONAA >60 01/11/2021 0814  ? GFRAA 115  02/08/2020 1128  ? GFRAA >60 08/22/2019 1118  ? ?Lipase  ?   ?Component Value Date/Time  ? LIPASE 33 08/13/2021 1235  ? ? ?Studies/Results: ?CT ABDOMEN PELVIS W CONTRAST ? ?Result Date: 08/13/2021 ?CLINICAL DATA:  Right groin abscess, concern for deep space infection. EXAM: CT ABDOMEN AND PELVIS WITH CONTRAST TECHNIQUE: Multidetector CT imaging of the abdomen and pelvis was performed using the standard protocol following bolus administration of intravenous contrast. RADIATION DOSE REDUCTION: This exam was performed according to the departmental dose-optimization program which includes automated exposure control, adjustment of the mA and/or kV according to patient size and/or use of iterative reconstruction technique. CONTRAST:  154m OMNIPAQUE IOHEXOL 300 MG/ML  SOLN COMPARISON:  CT January 11, 2021 FINDINGS: Lower chest: No acute abnormality.  Prostatic mitral valve. Hepatobiliary: No suspicious hepatic lesion. Gallbladder is unremarkable. No biliary ductal dilation. Pancreas: No pancreatic ductal dilation or evidence of acute inflammation. 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 Left adrenal gland is not well visualized. Surgical changes of prior left  nephrectomy without suspicious enhancing nodularity in the nephrectomy bed. Right kidney is unremarkable without hydronephrosis or suspicious renal mass. Mild wall thickening of an incompletely distended urinary bladder. Stomach/Bowel: Postsurgical changes prior partial left colectomy. No radiopaque enteric contrast material was administered. No pathologic dilation of small or large bowel. Normal appendix. No evidence of acute bowel inflammation. Mild symmetric wall thickening of a nondistended portion of  rectum for instance on axial image 73 and sagittal image 78. Vascular/Lymphatic: Aortic and branch vessel atherosclerosis without abdominal aortic aneurysm. No pathologically enlarged abdominal or pelvic lymph nodes. Reproductive: Enlarged prostate gland measuring 6.8 x 5.8 x 4.1 cm. Other: Stranding and nonspecific fluid in the right inguinal canal with multifocal subcutaneous emphysema common no walled off fluid collection subcutaneous soft tissue nodularity in the anterior abdominal wall for instance on image 37/2 and 44/2 likely reflect sequela of subcutaneous injections. Musculoskeletal: Advanced multilevel degenerative changes spine with dextroconvex curvature of the thoracolumbar spine. No acute osseous abnormality. IMPRESSION: 1. Subcutaneous emphysema tracking through stranding/fluid in the right inguinal canal but no walled off fluid collection to suggest abscess, suspicious for infection/cellulitis with gas-forming bacteria versus sequela of recent instrumentation for reported abscess. 2. Small bilateral hydroceles. 3. Mild symmetric wall thickening of a nondistended portion of rectum, at least somewhat accentuated by under distension and nonspecific consider correlation with direct visualization. 4. Enlarged prostate gland. 5. Surgical changes of prior left nephrectomy without suspicious enhancing nodularity in the nephrectomy bed. 6. 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 7. Aortic Atherosclerosis (ICD10-I70.0). Electronically Signed   By: Dahlia Bailiff M.D.   On: 08/13/2021 14:02   ? ?Anti-infectives: ?Anti-infectives (From admission, onward)  ? ? Start     Dose/Rate Route Frequency Ordered Stop  ? 08/14/21 0400  vancomycin (VANCOREADY) IVPB 1250 mg/250 mL  Status:  Discontinued       ? 1,250 mg ?166.7 mL/hr  over 90 Minutes Intravenous Every 12 hours 08/13/21 1424 08/13/21 1648  ? 08/14/21 0300  vancomycin (VANCOREADY) IVPB 1250 mg/250 mL  Status:  Discontinued       ? 1,250 mg ?166.7 mL/hr over 90 Minutes Intravenous Every 12 hours 08/13/21 2031 08/13/21 2147  ? 08/13/21 2200  meropenem (MERREM) 1 g in sodium chloride 0.9 % 100 mL IVPB       ? 1 g ?200 mL/hr over 30 Minutes Intravenous Every 8 hours 08/13/21 2031    ? 08/13/21 1700  linezolid (ZYVOX) IVPB 600 mg       ? 600 mg ?300 mL/hr over 60 Minutes Intravenous Every 12 hours 08/13/21 1642    ? 08/13/21 1645  vancomycin (VANCOCIN) IVPB 1000 mg/200 mL premix  Status:  Discontinued       ? 1,000 mg ?200 mL/hr over 60 Minutes Intravenous  Once 08/13/21 1643 08/13/21 1648  ? 08/13/21 1430  ceFEPIme (MAXIPIME) 2 g in sodium chloride 0.9 % 100 mL IVPB  Status:  Discontinued       ? 2 g ?200 mL/hr over 30 Minutes Intravenous Every 8 hours 08/13/21 1417 08/13/21 2026  ? 08/13/21 1430  metroNIDAZOLE (FLAGYL) IVPB 500 mg  Status:  Discontinued       ? 500 mg ?100 mL/hr over 60 Minutes Intravenous Every 12 hours 08/13/21 1417 08/13/21 2026  ? 08/13/21 1430  vancomycin (VANCOCIN) IVPB 1000 mg/200 mL premix  Status:  Discontinued       ?  See Hyperspace for full Linked Orders Report.  ? 1,000 mg ?200 mL/hr over 60 Minutes Intravenous  Once 08/13/21 1421 08/13/21 1643  ? 08/13/21 1430  vancomycin (VANCOCIN) IVPB 1000 mg/200 mL premix       ?See Hyperspace for full Linked Orders Report.  ? 1,000 mg ?200 mL/hr over 60 Minutes Intravenous  Once 08/13/21 1421 08/13/21 1637  ? ?  ? ? ? ?Assessment/Plan ?POD 1 s/p irrigation and debridement of NSTI of right groin by Dr. Rosendo Gros on 08/13/21 ?- Cont abx. Cx pending ?- Keep packing in place today. Plan for OR in am for 2nd look ?- Okay for diet. NPO at midnight ?- Mobilize, pulm toilet ? ?FEN - CM, NPO at midnight. IVF per [facility] ?VTE - SCDs, okay for chemical prophylaxis from a general surgery standpoint ?ID - Merrem, Linezolid ?Foley -  None ? ?DM2 ?HTN ?HLD ?Previous hx of Endocarditis MV s/p MVR ? ? LOS: 1 day  ? ? ?Jillyn Ledger , PA-C ?[facility] ?08/14/2021, 8:28 AM ?Please see Amion for pager number during day hours 7:00am-4:3

## 2021-08-14 NOTE — Progress Notes (Signed)
Inpatient Diabetes Program Recommendations ? ?AACE/ADA: New Consensus Statement on Inpatient Glycemic Control (2015) ? ?Target Ranges:  Prepandial:   less than 140 mg/dL ?     Peak postprandial:   less than 180 mg/dL (1-2 hours) ?     Critically ill patients:  140 - 180 mg/dL  ? ?Lab Results  ?Component Value Date  ? GLUCAP 261 (H) 08/14/2021  ? HGBA1C 7.0 (H) 08/14/2021  ? ? ?Review of Glycemic Control ? Latest Reference Range & Units 08/14/21 07:53 08/14/21 11:58  ?Glucose-Capillary 70 - 99 mg/dL 160 (H) 261 (H)  ? ?Diabetes history: DM 2 ?Outpatient Diabetes medications:  ?Toujeo 26 units daily ?Metformin 1000 mg bid ?Prandin 2 mg bid ?Current orders for Inpatient glycemic control:  ?Novolog sensitive q 4 hours ? ?Inpatient Diabetes Program Recommendations:   ? ?Note patient was on basal insulin prior to admit.  Please consider adding Semglee 14 units daily.  ? ?Thanks,  ?Adah Perl, RN, BC-ADM ?Inpatient Diabetes Coordinator ?Pager [phone]  (8a-5p) ? ?

## 2021-08-14 NOTE — Hospital Course (Signed)
[name] is a 56 y.o. 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  S/p I&D by general surgery 5/16. ?

## 2021-08-14 NOTE — Assessment & Plan Note (Signed)
Hold home metformin.  Start on SSI. ?

## 2021-08-14 NOTE — Progress Notes (Signed)
? ? ? [facility] ?                                                                            ? ? ?Ples Trudel, is a 56 y.o. male, DOB - Jan 09, 1966, OZD:[id number] ?Admit date - 08/13/2021    ?Outpatient Primary MD for the patient is Orpah Melter, MD ? ?LOS - 1  days ? ? ? ?Brief summary  ? ?GRISELDA TOSH is a 56 y.o. 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  S/p I&D by general surgery 5/16. ? ? ?Assessment & Plan  ? ? ?Assessment and Plan: ?* Necrotizing soft tissue infection right groin ?S/p I&D by general surgery, Dr. Rosendo Gros on 5/16.   ?Patient is going to OR tomorrow for a second look as per general surgery. ?Continue with empiric antibiotics with IV linezolid and IV meropenem. ?Pain control and ambulation as tolerated.  Follow blood cultures, negative so far.  Cultures from the OR negative so far ? ? ?Type 2 diabetes mellitus ([place]) ?Insulin-dependent, uncontrolled with hyperglycemia. ?Started the patient on Semglee 15 units daily and continue sliding scale insulin. ?Hemoglobin A1c around 7. ?CBG (last 3)  ?Recent Labs  ?  08/14/21 ?0335 08/14/21 ?0753 08/14/21 ?1158  ?GLUCAP 212* 160* 261*  ? ? ? 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 Benay Spice.  In remission per most recent oncology note. ? ?Hyperlipidemia ?Statin on hold for now. ? ?HYPERTENSION, BENIGN ?Blood pressure parameters appear to be optimal. ?Continue with lisinopril and metoprolol XL. ? ?Mild thrombocytopenia:  ?Continue to monitor platelets.  ? ? ?RN Pressure Injury Documentation: ?Pressure Ulcer  03/01/14 Stage I -  Intact skin with non-blanchable redness of a localized area usually over a bony prominence. (Active)  ?03/01/14 1620  ?Location: Sacrum  ?Location Orientation: Mid  ?Staging: Stage I -  Intact skin with non-blanchable redness of a localized area usually over a bony prominence.  ?Wound Description (Comments):   ?Present on Admission: Yes  ?Contiue with wound care.  ? ?  ? ?Estimated body mass index is 29.03 kg/m? as calculated from the following: ?  Height as of this encounter: 6' (1.829 m). ?  Weight as of this encounter: 97.1 kg. ? ?Code Status: full code.  ?DVT Prophylaxis:  SCDs Start: 08/13/21 2045 ? ? ?Level of Care: Level of care: Telemetry Medical ?Family Communication: none at bedside.  ? ?Disposition Plan:     Remains inpatient appropriate:  IV antibiotics.  ? ?Procedures:  ? ? ?Consultants:   ?General surgery.  ? ?Antimicrobials:  ? ?Anti-infectives (From admission, onward)  ? ? Start     Dose/Rate Route Frequency Ordered Stop  ? 08/14/21 0400  vancomycin (VANCOREADY) IVPB 1250 mg/250 mL  Status:  Discontinued       ? 1,250 mg ?166.7 mL/hr over 90 Minutes Intravenous Every 12 hours 08/13/21 1424 08/13/21 1648  ?  08/14/21 0300  vancomycin (VANCOREADY) IVPB 1250 mg/250 mL  Status:  Discontinued       ? 1,250 mg ?166.7 mL/hr over 90 Minutes Intravenous Every 12 hours 08/13/21 2031 08/13/21 2147  ? 08/13/21 2200  meropenem (MERREM) 1 g in sodium chloride 0.9 % 100 mL IVPB       ? 1 g ?200 mL/hr over 30 Minutes Intravenous Every 8 hours 08/13/21 2031    ? 08/13/21 1700  linezolid (ZYVOX) IVPB 600 mg       ? 600 mg ?300 mL/hr over 60 Minutes Intravenous Every 12 hours 08/13/21 1642    ? 08/13/21 1645  vancomycin (VANCOCIN) IVPB 1000 mg/200 mL premix  Status:  Discontinued       ? 1,000 mg ?200 mL/hr over 60 Minutes Intravenous  Once 08/13/21 1643 08/13/21 1648  ? 08/13/21 1430  ceFEPIme (MAXIPIME) 2 g in sodium chloride 0.9 % 100 mL IVPB  Status:  Discontinued       ? 2 g ?200 mL/hr over 30  Minutes Intravenous Every 8 hours 08/13/21 1417 08/13/21 2026  ? 08/13/21 1430  metroNIDAZOLE (FLAGYL) IVPB 500 mg  Status:  Discontinued       ? 500 mg ?100 mL/hr over 60 Minutes Intravenous Every 12 hours 08/13/21 1417 08/13/21 2026  ? 08/13/21 1430  vancomycin (VANCOCIN) IVPB 1000 mg/200 mL premix  Status:  Discontinued       ?See Hyperspace for full Linked Orders Report.  ? 1,000 mg ?200 mL/hr over 60 Minutes Intravenous  Once 08/13/21 1421 08/13/21 1643  ? 08/13/21 1430  vancomycin (VANCOCIN) IVPB 1000 mg/200 mL premix       ?See Hyperspace for full Linked Orders Report.  ? 1,000 mg ?200 mL/hr over 60 Minutes Intravenous  Once 08/13/21 1421 08/13/21 1637  ? ?  ? ? ? ?Medications ? ?Scheduled Meds: ? insulin aspart  0-9 Units Subcutaneous Q4H  ? insulin glargine-yfgn  15 Units Subcutaneous Daily  ? sodium chloride flush  3 mL Intravenous Q12H  ? ?Continuous Infusions: ? linezolid (ZYVOX) IV 600 mg (08/14/21 5053)  ? meropenem (MERREM) IV 1 g (08/14/21 0545)  ? ?PRN Meds:.acetaminophen **OR** acetaminophen, HYDROmorphone (DILAUDID) injection, ondansetron **OR** ondansetron (ZOFRAN) IV, senna-docusate ? ? ? ?Subjective:  ? ?Dmari Schubring was seen and examined today.  Pain controlled, and patient ambulating in the hallway.  ?No chest pain or sob.  ? ?Objective:  ? ?Vitals:  ? 08/14/21 0033 08/14/21 9767 08/14/21 0602 08/14/21 0748  ?BP: 132/83 117/70  130/85  ?Pulse: 66 65  68  ?Resp: '18 18  18  '$ ?Temp: 98.2 ?F (36.8 ?C) 98 ?F (36.7 ?C)  98.1 ?F (36.7 ?C)  ?TempSrc: Oral Oral  Oral  ?SpO2: 98% 100%  99%  ?Weight:   97.1 kg   ?Height:      ? ? ?Intake/Output Summary (Last 24 hours) at 08/14/2021 1352 ?Last data filed at 08/14/2021 1041 ?Gross per 24 hour  ?Intake 2120 ml  ?Output 1370 ml  ?Net 750 ml  ? ?Filed Weights  ? 08/13/21 1214 08/13/21 2045 08/14/21 0602  ?Weight: 97.1 kg 100.1 kg 97.1 kg  ? ? ? ?Exam ?General exam: Appears calm and comfortable  ?Respiratory system: Clear to auscultation. Respiratory effort  normal. ?Cardiovascular system: S1 & S2 heard, RRR. No JVD, No pedal edema. ?Gastrointestinal system: Abdomen is nondistended, soft and nontender. Right groin bandaged. Normal bowel sounds heard. ?Central nervous system: Alert and oriented. No focal neurological deficits. ?Extremities: Symmetric 5 x  5 power. ?Skin: No rashes, lesions or ulcers ?Psychiatry: Mood & affect appropriate.  ? ? ? ? ?Data Reviewed:  I have personally reviewed following labs and imaging studies ? ? ?CBC ?Lab Results  ?Component Value Date  ? WBC 11.0 (H) 08/14/2021  ? RBC 4.33 08/14/2021  ? HGB 13.8 08/14/2021  ? HCT 40.8 08/14/2021  ? MCV 94.2 08/14/2021  ? MCH 31.9 08/14/2021  ? PLT 126 (L) 08/14/2021  ? MCHC 33.8 08/14/2021  ? RDW 12.7 08/14/2021  ? LYMPHSABS 0.7 08/13/2021  ? MONOABS 0.8 08/13/2021  ? EOSABS 0.1 08/13/2021  ? BASOSABS 0.0 08/13/2021  ? ? ? ?Last metabolic panel ?Lab Results  ?Component Value Date  ? NA 135 08/14/2021  ? K 4.5 08/14/2021  ? CL 103 08/14/2021  ? CO2 25 08/14/2021  ? BUN 13 08/14/2021  ? CREATININE 1.07 08/14/2021  ? GLUCOSE 183 (H) 08/14/2021  ? GFRNONAA >60 08/14/2021  ? GFRAA 115 02/08/2020  ? CALCIUM 8.7 (L) 08/14/2021  ? PROT 6.0 (L) 08/14/2021  ? ALBUMIN 2.9 (L) 08/14/2021  ? BILITOT 1.4 (H) 08/14/2021  ? ALKPHOS 39 08/14/2021  ? AST 28 08/14/2021  ? ALT 26 08/14/2021  ? ANIONGAP 7 08/14/2021  ? ? ?CBG (last 3)  ?Recent Labs  ?  08/14/21 ?0335 08/14/21 ?0753 08/14/21 ?1158  ?GLUCAP 212* 160* 261*  ?  ? ? ?Coagulation Profile: ?Recent Labs  ?Lab 08/13/21 ?1235  ?INR 1.1  ? ? ? ?Radiology Studies: ?CT ABDOMEN PELVIS W CONTRAST ? ?Result Date: 08/13/2021 ?CLINICAL DATA:  Right groin abscess, concern for deep space infection. EXAM: CT ABDOMEN AND PELVIS WITH CONTRAST TECHNIQUE: Multidetector CT imaging of the abdomen and pelvis was performed using the standard protocol following bolus administration of intravenous contrast. RADIATION DOSE REDUCTION: This exam was performed according to the departmental  dose-optimization program which includes automated exposure control, adjustment of the mA and/or kV according to patient size and/or use of iterative reconstruction technique. CONTRAST:  130m OMNIPAQUE IOHE

## 2021-08-14 NOTE — Assessment & Plan Note (Signed)
Continue lisinopril and Toprol-XL ?

## 2021-08-14 NOTE — Assessment & Plan Note (Signed)
S/p I&D by general surgery, Dr. Rosendo Gros on 5/16.  Infection appears to be localized to right groin but may need second look per general surgery. ?-Continue empiric antibiotics with linezolid and meropenem ?-Follow blood and OR cultures, narrow antibiotics as able ?

## 2021-08-14 NOTE — TOC Initial Note (Signed)
Transition of Care (TOC) - Initial/Assessment Note  ? ? ?Patient Details  ?Name: [name] ?MRN: [id number] ?Date of Birth: 1965/05/20 ? ?Transition of Care (TOC) CM/SW Contact:    ?Marilu Favre, RN ?Phone Number: ?08/14/2021, 12:42 PM ? ?Clinical Narrative:                 ?Spoke to patient at bedside. Confirmed face sheet information.  ? ? ?Patient for repeat I and D 08/15/21. Will await wound care instructions. Explained to patient bedside nurse will provide wound care education to patient and wife prior to discharge . ? ?TOC Team will follow for wound care needs and supplies . Will need detailed list of supplies to order for home  ? ?Expected Discharge Plan: Home/Self Care ?Barriers to Discharge: Continued Medical Work up ? ? ?Patient Goals and CMS Choice ?Patient states their goals for this hospitalization and ongoing recovery are:: to return to home ?CMS Medicare.gov Compare Post Acute Care list provided to:: Patient ?Choice offered to / list presented to : Patient ? ?Expected Discharge Plan and Services ?Expected Discharge Plan: Home/Self Care ?  ?Discharge Planning Services: CM Consult ?  ?Living arrangements for the past 2 months: [place] ?                ?DME Arranged: N/A ?  ?  ?  ?  ?HH Arranged: NA ?  ?  ?  ?  ? ?Prior Living Arrangements/Services ?Living arrangements for the past 2 months: [place] ?Lives with:: Spouse ?Patient language and need for interpreter reviewed:: Yes ?Do you feel safe going back to the place where you live?: Yes      ?Need for Family Participation in Patient Care: Yes (Comment) ?Care giver support system in place?: Yes (comment) ?  ?Criminal Activity/Legal Involvement Pertinent to Current Situation/Hospitalization: No - Comment as needed ? ?Activities of Daily Living ?Home Assistive Devices/Equipment: None ?ADL Screening (condition at time of admission) ?Patient's cognitive ability adequate to safely complete daily activities?: Yes ?Is the  patient deaf or have difficulty hearing?: No ?Does the patient have difficulty seeing, even when wearing glasses/contacts?: No ?Does the patient have difficulty concentrating, remembering, or making decisions?: No ?Patient able to express need for assistance with ADLs?: Yes ?Does the patient have difficulty dressing or bathing?: No ?Independently performs ADLs?: Yes (appropriate for developmental age) ?Does the patient have difficulty walking or climbing stairs?: No ?Weakness of Legs: None ?Weakness of Arms/Hands: None ? ?Permission Sought/Granted ?  ?Permission granted to share information with : No ?   ?   ?   ?   ? ?Emotional Assessment ?Appearance:: Appears stated age ?Attitude/Demeanor/Rapport: Engaged ?Affect (typically observed): Accepting ?Orientation: : Oriented to Self, Oriented to Place, Oriented to  Time, Oriented to Situation ?Alcohol / Substance Use: Not Applicable ?Psych Involvement: No (comment) ? ?Admission diagnosis:  Cellulitis, unspecified cellulitis site [L03.90] ?Sepsis, due to unspecified organism, unspecified whether acute organ dysfunction present ([place]) [A41.9] ?Status post surgery [Z98.890] ?Necrotizing soft tissue infection [M79.89] ?Patient Active Problem List  ? Diagnosis Date Noted  ? Status post surgery 08/13/2021  ? Necrotizing soft tissue infection right groin 08/13/2021  ? S/P MVR (mitral valve repair) 05/09/2020  ? History of colon cancer 01/03/2020  ? Port-A-Cath in place 06/27/2019  ? Scoliosis (and kyphoscoliosis), idiopathic 03/04/2019  ? 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 History of thyroid  cancer 01/26/2019  ? Multiple lipomas 01/26/2019  ? 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 Family history of colon cancer   ? Family history of thyroid cancer   ? Screen for colon cancer 01/07/2019  ? Type 2 diabetes mellitus ([place]) 01/11/2015  ? Discitis   ? Mitral regurgitation  07/06/2014  ? Discitis of lumbar region 03/08/2014  ? 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 Solitary kidney, acquired 03/07/2014  ? Malnutrition of moderate degree ([place]) 03/01/2014  ? 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 Severe sepsis ([place])   ? Intractable back pain 02/27/2014  ? Hyperglycemia 02/27/2014  ? Hyperlipidemia 02/27/2014  ? 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 Family history of ischemic heart disease 04/05/2013  ? HYPERTENSION, BENIGN 03/12/2008  ? PALPITATIONS 03/12/2008  ? ?PCP:  Orpah Melter, MD ?Pharmacy:   ?[facility] - [address], [place] ?[place] ?[place] [address] ?Phone: [phone] Fax: [phone] ? ?[id number] [place], [place] - [address] ?[address] ?[place] [address]?Phone: [phone]: [phone]? ?[place]?[address]?Phone: [phone]: [phone]? ?[facility] ?[address] [place]?Phone: [phone]: [phone]? ? ? ? ?Social Determinants of Health (SDOH) Interventions ?  ? ?Readmission Risk Interventions ?   ? View : No data to display.  ?  ?  ?  ? ? ? ?

## 2021-08-14 NOTE — Assessment & Plan Note (Signed)
Statin on hold for now. ?

## 2021-08-15 ENCOUNTER — Inpatient Hospital Stay (HOSPITAL_COMMUNITY): Payer: BC Managed Care – PPO | Admitting: Certified Registered"

## 2021-08-15 ENCOUNTER — Encounter (HOSPITAL_COMMUNITY): Payer: Self-pay | Admitting: Internal Medicine

## 2021-08-15 ENCOUNTER — Other Ambulatory Visit: Payer: Self-pay

## 2021-08-15 ENCOUNTER — Encounter (HOSPITAL_COMMUNITY)
Admission: EM | Disposition: A | Discharge status: Home Health | Payer: Self-pay | Source: Home / Self Care | Attending: Internal Medicine

## 2021-08-15 DIAGNOSIS — E119 Type 2 diabetes mellitus without complications: Secondary | ICD-10-CM | POA: Diagnosis not present

## 2021-08-15 DIAGNOSIS — I10 Essential (primary) hypertension: Secondary | ICD-10-CM | POA: Diagnosis not present

## 2021-08-15 DIAGNOSIS — E782 Mixed hyperlipidemia: Secondary | ICD-10-CM | POA: Diagnosis not present

## 2021-08-15 DIAGNOSIS — M7989 Other specified soft tissue disorders: Secondary | ICD-10-CM | POA: Diagnosis not present

## 2021-08-15 HISTORY — PX: IRRIGATION AND DEBRIDEMENT ABSCESS: SHX5252

## 2021-08-15 HISTORY — PX: APPLICATION OF WOUND VAC: SHX5189

## 2021-08-15 LAB — GLUCOSE, CAPILLARY
Glucose-Capillary: 141 mg/dL — ABNORMAL HIGH (ref 70–99)
Glucose-Capillary: 146 mg/dL — ABNORMAL HIGH (ref 70–99)
Glucose-Capillary: 161 mg/dL — ABNORMAL HIGH (ref 70–99)
Glucose-Capillary: 164 mg/dL — ABNORMAL HIGH (ref 70–99)
Glucose-Capillary: 170 mg/dL — ABNORMAL HIGH (ref 70–99)
Glucose-Capillary: 176 mg/dL — ABNORMAL HIGH (ref 70–99)
Glucose-Capillary: 184 mg/dL — ABNORMAL HIGH (ref 70–99)
Glucose-Capillary: 271 mg/dL — ABNORMAL HIGH (ref 70–99)
Glucose-Capillary: 325 mg/dL — ABNORMAL HIGH (ref 70–99)

## 2021-08-15 SURGERY — EXAM UNDER ANESTHESIA
Anesthesia: General | Laterality: Right

## 2021-08-15 MED ORDER — PROPOFOL 10 MG/ML IV BOLUS
INTRAVENOUS | Status: AC
  Filled 2021-08-15: qty 20

## 2021-08-15 MED ORDER — ONDANSETRON HCL 4 MG/2ML IJ SOLN
INTRAMUSCULAR | Status: AC
  Filled 2021-08-15: qty 2

## 2021-08-15 MED ORDER — PROPOFOL 10 MG/ML IV BOLUS
INTRAVENOUS | Status: DC | PRN
  Administered 2021-08-15: 200 mg via INTRAVENOUS

## 2021-08-15 MED ORDER — DEXAMETHASONE SODIUM PHOSPHATE 10 MG/ML IJ SOLN
INTRAMUSCULAR | Status: AC
  Filled 2021-08-15: qty 1

## 2021-08-15 MED ORDER — ENOXAPARIN SODIUM 40 MG/0.4ML IJ SOSY
40.0000 mg | PREFILLED_SYRINGE | INTRAMUSCULAR | Status: DC
  Administered 2021-08-15 – 2021-08-16 (×2): 40 mg via SUBCUTANEOUS
  Filled 2021-08-15 (×2): qty 0.4

## 2021-08-15 MED ORDER — HYDROMORPHONE HCL 1 MG/ML IJ SOLN
1.0000 mg | INTRAMUSCULAR | Status: DC | PRN
  Administered 2021-08-15 – 2021-08-16 (×2): 1 mg via INTRAVENOUS
  Filled 2021-08-15 (×2): qty 1

## 2021-08-15 MED ORDER — DEXAMETHASONE SODIUM PHOSPHATE 10 MG/ML IJ SOLN
INTRAMUSCULAR | Status: DC | PRN
  Administered 2021-08-15: 10 mg via INTRAVENOUS

## 2021-08-15 MED ORDER — LIDOCAINE 2% (20 MG/ML) 5 ML SYRINGE
INTRAMUSCULAR | Status: DC | PRN
  Administered 2021-08-15: 100 mg via INTRAVENOUS

## 2021-08-15 MED ORDER — INSULIN ASPART 100 UNIT/ML IJ SOLN: 0.0000 [IU] | INTRAMUSCULAR | Status: DC | PRN

## 2021-08-15 MED ORDER — FENTANYL CITRATE (PF) 250 MCG/5ML IJ SOLN
INTRAMUSCULAR | Status: AC
  Filled 2021-08-15: qty 5

## 2021-08-15 MED ORDER — 0.9 % SODIUM CHLORIDE (POUR BTL) OPTIME
TOPICAL | Status: DC | PRN
  Administered 2021-08-15: 1000 mL

## 2021-08-15 MED ORDER — FENTANYL CITRATE (PF) 100 MCG/2ML IJ SOLN
25.0000 ug | INTRAMUSCULAR | Status: DC | PRN
  Administered 2021-08-15: 50 ug via INTRAVENOUS

## 2021-08-15 MED ORDER — FENTANYL CITRATE (PF) 250 MCG/5ML IJ SOLN
INTRAMUSCULAR | Status: DC | PRN
  Administered 2021-08-15: 50 ug via INTRAVENOUS

## 2021-08-15 MED ORDER — ORAL CARE MOUTH RINSE: 15.0000 mL | Freq: Once | OROMUCOSAL | Status: DC

## 2021-08-15 MED ORDER — METRONIDAZOLE 500 MG PO TABS
500.0000 mg | ORAL_TABLET | Freq: Two times a day (BID) | ORAL | Status: DC
Start: 2021-08-15 — End: 2021-08-17
  Administered 2021-08-15 – 2021-08-16 (×4): 500 mg via ORAL
  Filled 2021-08-15 (×4): qty 1

## 2021-08-15 MED ORDER — MIDAZOLAM HCL 2 MG/2ML IJ SOLN
INTRAMUSCULAR | Status: DC | PRN
  Administered 2021-08-15: 2 mg via INTRAVENOUS

## 2021-08-15 MED ORDER — MIDAZOLAM HCL 2 MG/2ML IJ SOLN
INTRAMUSCULAR | Status: AC
  Filled 2021-08-15: qty 2

## 2021-08-15 MED ORDER — AMISULPRIDE (ANTIEMETIC) 5 MG/2ML IV SOLN: 10.0000 mg | Freq: Once | INTRAVENOUS | Status: DC | PRN

## 2021-08-15 MED ORDER — SODIUM CHLORIDE 0.9 % IV SOLN
2.0000 g | Freq: Three times a day (TID) | INTRAVENOUS | Status: DC
  Administered 2021-08-15 – 2021-08-17 (×6): 2 g via INTRAVENOUS
  Filled 2021-08-15 (×6): qty 12.5

## 2021-08-15 MED ORDER — LACTATED RINGERS IV SOLN: INTRAVENOUS | Status: DC

## 2021-08-15 MED ORDER — CHLORHEXIDINE GLUCONATE 0.12 % MT SOLN
OROMUCOSAL | Status: AC
  Filled 2021-08-15: qty 15

## 2021-08-15 MED ORDER — CHLORHEXIDINE GLUCONATE 0.12 % MT SOLN: 15.0000 mL | Freq: Once | OROMUCOSAL | Status: DC

## 2021-08-15 MED ORDER — LIDOCAINE 2% (20 MG/ML) 5 ML SYRINGE
INTRAMUSCULAR | Status: AC
  Filled 2021-08-15: qty 5

## 2021-08-15 MED ORDER — FENTANYL CITRATE (PF) 100 MCG/2ML IJ SOLN
INTRAMUSCULAR | Status: AC
  Filled 2021-08-15: qty 2

## 2021-08-15 SURGICAL SUPPLY — 24 items
BAG COUNTER SPONGE SURGICOUNT (BAG) ×4 IMPLANT
CANISTER SUCT 3000ML PPV (MISCELLANEOUS) ×4 IMPLANT
CANISTER WOUNDNEG PRESSURE 500 (CANNISTER) ×1 IMPLANT
COVER SURGICAL LIGHT HANDLE (MISCELLANEOUS) ×4 IMPLANT
DRAPE HALF SHEET 40X57 (DRAPES) ×1 IMPLANT
DRAPE LAPAROSCOPIC ABDOMINAL (DRAPES) ×4 IMPLANT
DRAPE UNDERBUTTOCKS STRL (DISPOSABLE) ×1 IMPLANT
DRSG VAC ATS MED SENSATRAC (GAUZE/BANDAGES/DRESSINGS) ×1 IMPLANT
ELECT REM PT RETURN 9FT ADLT (ELECTROSURGICAL) ×4 IMPLANT
ELECTRODE REM PT RTRN 9FT ADLT (ELECTROSURGICAL) ×3 IMPLANT
GLOVE BIO SURGEON STRL SZ7 (GLOVE) ×4 IMPLANT
GLOVE BIOGEL PI IND STRL 7.5 (GLOVE) ×3 IMPLANT
GLOVE BIOGEL PI INDICATOR 7.5 (GLOVE) ×1
GOWN STRL REUS W/ TWL LRG LVL3 (GOWN DISPOSABLE) ×6 IMPLANT
GOWN STRL REUS W/TWL LRG LVL3 (GOWN DISPOSABLE) ×8
KIT BASIN OR (CUSTOM PROCEDURE TRAY) ×4 IMPLANT
KIT TURNOVER KIT B (KITS) ×4 IMPLANT
LEGGING LITHOTOMY PAIR STRL (DRAPES) ×1 IMPLANT
NS IRRIG 1000ML POUR BTL (IV SOLUTION) ×4 IMPLANT
PACK GENERAL/GYN (CUSTOM PROCEDURE TRAY) ×4 IMPLANT
PAD ARMBOARD 7.5X6 YLW CONV (MISCELLANEOUS) ×8 IMPLANT
SPECIMEN JAR SMALL (MISCELLANEOUS) ×4 IMPLANT
TOWEL GREEN STERILE (TOWEL DISPOSABLE) ×4 IMPLANT
TOWEL GREEN STERILE FF (TOWEL DISPOSABLE) ×4 IMPLANT

## 2021-08-15 NOTE — Progress Notes (Signed)
Patient transferred to short stay via bed. Report called. Pre procedure checklist done. Chg wipes completed. All undergarments and retainer left in room.

## 2021-08-15 NOTE — Progress Notes (Signed)
PT Cancellation Note  Patient Details Name: [name] MRN: [id number] DOB: 1965-12-13   Cancelled Treatment:    Reason Eval/Treat Not Completed: Patient at procedure or test/unavailable.  Gone to surgery, retry at another time.   Ramond Dial 08/15/2021, 10:16 AM  Mee Hives, PT PhD Acute Rehab Dept. Number: [place] [facility] [place]

## 2021-08-15 NOTE — Transfer of Care (Signed)
Immediate Anesthesia Transfer of Care Note  Patient: [name]  Procedure(s) Performed: [name] UNDER ANESTHESIA IRRIGATION AND DEBRIDEMENT OF GROIN (Right) APPLICATION OF WOUND VAC  Patient Location: PACU  Anesthesia Type:General  Level of Consciousness: awake, alert  and oriented  Airway & Oxygen Therapy: Patient Spontanous Breathing and Patient connected to face mask oxygen  Post-op Assessment: Report given to RN and Post -op Vital signs reviewed and stable  Post vital signs: Reviewed and stable  Last Vitals:  Vitals Value Taken Time  BP 139/93 08/15/21 1120  Temp 36.6 C 08/15/21 1120  Pulse 74 08/15/21 1122  Resp 12 08/15/21 1122  SpO2 100 % 08/15/21 1122  Vitals shown include unvalidated device data.  Last Pain:  Vitals:   08/15/21 1010  TempSrc:   PainSc: 0-No pain      Patients Stated Pain Goal: 2 (59/45/85 [id number])  Complications: No notable events documented.

## 2021-08-15 NOTE — Anesthesia Procedure Notes (Signed)
Procedure Name: LMA Insertion Date/Time: 08/15/2021 10:38 AM Performed by: Lavell Luster, CRNA Pre-anesthesia Checklist: Patient identified, Emergency Drugs available, Suction available, Patient being monitored and Timeout performed Patient Re-evaluated:Patient Re-evaluated prior to induction Oxygen Delivery Method: Circle system utilized Preoxygenation: Pre-oxygenation with 100% oxygen Induction Type: IV induction Ventilation: Mask ventilation without difficulty LMA: LMA inserted LMA Size: 4.0 Number of attempts: 1 Placement Confirmation: breath sounds checked- equal and bilateral, ETT inserted through vocal cords under direct vision and positive ETCO2 Tube secured with: Tape Dental Injury: Teeth and Oropharynx as per pre-operative assessment

## 2021-08-15 NOTE — Anesthesia Postprocedure Evaluation (Signed)
Anesthesia Post Note  Patient: [name]  Procedure(s) Performed: [name] UNDER ANESTHESIA IRRIGATION AND DEBRIDEMENT OF GROIN (Right) APPLICATION OF WOUND VAC     Anesthesia Type: General Anesthetic complications: no   No notable events documented.  Last Vitals:  Vitals:   08/15/21 1120 08/15/21 1209  BP: (!) 139/93 (!) 140/92  Pulse: 79 70  Resp: 15 18  Temp: 36.6 C 36.7 C  SpO2: 97% 98%    Last Pain:  Vitals:   08/15/21 1209  TempSrc: Oral  PainSc:                  Tiajuana Amass

## 2021-08-15 NOTE — Anesthesia Preprocedure Evaluation (Signed)
Anesthesia Evaluation  Patient identified by MRN, date of birth, ID band Patient awake    Reviewed: Allergy & Precautions, NPO status , Patient's Chart, lab work & pertinent test results  History of Anesthesia Complications (+) PONV and history of anesthetic complications  Airway Mallampati: I  TM Distance: >3 FB Neck ROM: Full    Dental  (+) Teeth Intact, Dental Advisory Given   Pulmonary asthma ,    breath sounds clear to auscultation       Cardiovascular hypertension, Pt. on medications and Pt. on home beta blockers + Valvular Problems/Murmurs MR  Rhythm:Regular Rate:Normal  Echo: 1. Left ventricular ejection fraction, by estimation, is 55 to 60%. The left ventricle has normal function. The left ventricle has no regional wall motion abnormalities. Left ventricular diastolic parameters are consistent with Grade II diastolic dysfunction (pseudonormalization). Elevated left atrial pressure.  2. Right ventricular systolic function is low normal. The right  ventricular size is mildly enlarged. There is normal pulmonary artery  systolic pressure.  3. Left atrial size was moderately dilated.  4. Right atrial size was mildly dilated.  5. S/p MV valvuloplasty with chord placement and 28 mm Sorin Memo 4D annuloplasty ring (05/09/20). Peak and men gradients through the valve are 11 and 6 mm Hg respectively. MVA (VTI) is 1.7 cm2. Trivial mitral valve  regurgitation.  6. The aortic valve is tricuspid. Aortic valve regurgitation is not visualized. Mild aortic valve sclerosis is present, with no evidence of aortic valve stenosis.  7. Aortic dilatation noted. There is mild dilatation of the ascending aorta, measuring 41 mm.  8. The inferior vena cava is normal in size with greater than 50% respiratory variability, suggesting right atrial pressure of 3 mmHg.    Neuro/Psych negative neurological ROS  negative psych ROS   GI/Hepatic Neg  liver ROS, hiatal hernia,   Endo/Other  diabetes, Type 2, Insulin Dependent, Oral Hypoglycemic Agents  Renal/GU Renal disease     Musculoskeletal  (+) Arthritis ,   Abdominal Normal abdominal exam  (+)   Peds  Hematology negative hematology ROS (+)   Anesthesia Other Findings   Reproductive/Obstetrics                             Anesthesia Physical  Anesthesia Plan  ASA: 3  Anesthesia Plan: General   Post-op Pain Management: Tylenol PO (pre-op)* and Minimal or no pain anticipated   Induction: Intravenous  PONV Risk Score and Plan: 4 or greater and Ondansetron, Dexamethasone, Midazolam, Treatment may vary due to age or medical condition and Scopolamine patch - Pre-op  Airway Management Planned: LMA  Additional Equipment: None  Intra-op Plan:   Post-operative Plan: Extubation in OR  Informed Consent: I have reviewed the patients History and Physical, chart, labs and discussed the procedure including the risks, benefits and alternatives for the proposed anesthesia with the patient or authorized representative who has indicated his/her understanding and acceptance.     Dental advisory given  Plan Discussed with: CRNA  Anesthesia Plan Comments:         Anesthesia Quick Evaluation

## 2021-08-15 NOTE — Progress Notes (Signed)
Day of Surgery   Subjective/Chief Complaint: Doing well, for or today   Objective: Vital signs in last 24 hours: Temp:  [97.5 F (36.4 C)-99.2 F (37.3 C)] 97.9 F (36.6 C) (05/18 1000) Pulse Rate:  [67-78] 72 (05/18 1000) Resp:  [16-20] 18 (05/18 0957) BP: (139-150)/(73-91) 142/91 (05/18 0957) SpO2:  [95 %-100 %] 98 % (05/18 0957) Weight:  [98 kg-98.3 kg] 98 kg (05/18 0957) Last BM Date : 08/13/21  Intake/Output from previous day: 05/17 0701 - 05/18 0700 In: 1404.5 [P.O.:720; IV Piggyback:684.5] Out: 850 [Urine:850] Intake/Output this shift: Total I/O In: -  Out: 550 [Urine:550]  Will look at wound in OR  Lab Results:  Recent Labs    08/13/21 1235 08/14/21 0536  WBC 10.7* 11.0*  HGB 14.7 13.8  HCT 43.8 40.8  PLT 147* 126*   BMET Recent Labs    08/13/21 1235 08/14/21 0536  NA 133* 135  K 4.3 4.5  CL 100 103  CO2 25 25  GLUCOSE 203* 183*  BUN 20 13  CREATININE 1.07 1.07  CALCIUM 9.2 8.7*   PT/INR Recent Labs    08/13/21 1235  LABPROT 14.2  INR 1.1   ABG No results for input(s): PHART, HCO3 in the last 72 hours.  Invalid input(s): PCO2, PO2  Studies/Results: CT ABDOMEN PELVIS W CONTRAST  Result Date: 08/13/2021 CLINICAL DATA:  Right groin abscess, concern for deep space infection. EXAM: CT ABDOMEN AND PELVIS WITH CONTRAST TECHNIQUE: Multidetector CT imaging of the abdomen and pelvis was performed using the standard protocol following bolus administration of intravenous contrast. RADIATION DOSE REDUCTION: This exam was performed according to the departmental dose-optimization program which includes automated exposure control, adjustment of the mA and/or kV according to patient size and/or use of iterative reconstruction technique. CONTRAST:  171m OMNIPAQUE IOHEXOL 300 MG/ML  SOLN COMPARISON:  CT January 11, 2021 FINDINGS: Lower chest: No acute abnormality.  Prostatic mitral valve. Hepatobiliary: No suspicious hepatic lesion. Gallbladder is  unremarkable. No biliary ductal dilation. Pancreas: No pancreatic ductal dilation or evidence of acute inflammation. 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 Left adrenal gland is not well visualized. Surgical changes of prior left nephrectomy without suspicious enhancing nodularity in the nephrectomy bed. Right kidney is unremarkable without hydronephrosis or suspicious renal mass. Mild wall thickening of an incompletely distended urinary bladder. Stomach/Bowel: Postsurgical changes prior partial left colectomy. No radiopaque enteric contrast material was administered. No pathologic dilation of small or large bowel. Normal appendix. No evidence of acute bowel inflammation. Mild symmetric wall thickening of a nondistended portion of rectum for instance on axial image 73 and sagittal image 78. Vascular/Lymphatic: Aortic and branch vessel atherosclerosis without abdominal aortic aneurysm. No pathologically enlarged abdominal or pelvic lymph nodes. Reproductive: Enlarged prostate gland measuring 6.8 x 5.8 x 4.1 cm. Other: Stranding and nonspecific fluid in the right inguinal canal with multifocal subcutaneous emphysema common no walled off fluid collection subcutaneous soft tissue nodularity in the anterior abdominal wall for instance on image 37/2 and 44/2 likely reflect sequela of subcutaneous injections. Musculoskeletal: Advanced multilevel degenerative changes spine with dextroconvex curvature of the thoracolumbar spine. No acute osseous abnormality. IMPRESSION: 1. Subcutaneous emphysema tracking through stranding/fluid in the right inguinal canal but no walled off fluid collection to suggest abscess, suspicious for infection/cellulitis with gas-forming  bacteria versus  sequela of recent instrumentation for reported abscess. 2. Small bilateral hydroceles. 3. Mild symmetric wall thickening of a nondistended portion of rectum, at least somewhat accentuated by under distension and nonspecific consider correlation with direct visualization. 4. Enlarged prostate gland. 5. Surgical changes of prior left nephrectomy without suspicious enhancing nodularity in the nephrectomy bed. 6. 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 7. Aortic Atherosclerosis (ICD10-I70.0). Electronically Signed   By: Dahlia Bailiff M.D.   On: 08/13/2021 14:02    Anti-infectives: Anti-infectives (From admission, onward)    Start     Dose/Rate Route Frequency Ordered Stop   08/14/21 0400  vancomycin (VANCOREADY) IVPB 1250 mg/250 mL  Status:  Discontinued        1,250 mg 166.7 mL/hr over 90 Minutes Intravenous Every 12 hours 08/13/21 1424 08/13/21 1648   08/14/21 0300  vancomycin (VANCOREADY) IVPB 1250 mg/250 mL  Status:  Discontinued        1,250 mg 166.7 mL/hr over 90 Minutes Intravenous Every 12 hours 08/13/21 2031 08/13/21 2147   08/13/21 2200  [MAR Hold]  meropenem (MERREM) 1 g in sodium chloride 0.9 % 100 mL IVPB        (MAR Hold since Thu 08/15/2021 at 0953.Hold Reason: Transfer to a Procedural area)   1 g 200 mL/hr over 30 Minutes Intravenous Every 8 hours 08/13/21 2031     08/13/21 1700  [MAR Hold]  linezolid (ZYVOX) IVPB 600 mg        (MAR Hold since Thu 08/15/2021 at 0953.Hold Reason: Transfer to a Procedural area)   600 mg 300 mL/hr over 60 Minutes Intravenous Every 12 hours 08/13/21 1642     08/13/21 1645  vancomycin (VANCOCIN) IVPB 1000 mg/200 mL premix  Status:  Discontinued        1,000 mg 200 mL/hr over 60 Minutes Intravenous  Once 08/13/21 1643 08/13/21 1648   08/13/21 1430   ceFEPIme (MAXIPIME) 2 g in sodium chloride 0.9 % 100 mL IVPB  Status:  Discontinued        2 g 200 mL/hr over 30 Minutes Intravenous Every 8 hours 08/13/21 1417 08/13/21 2026   08/13/21 1430  metroNIDAZOLE (FLAGYL) IVPB 500 mg  Status:  Discontinued        500 mg 100 mL/hr over 60 Minutes Intravenous Every 12 hours 08/13/21 1417 08/13/21 2026   08/13/21 1430  vancomycin (VANCOCIN) IVPB 1000 mg/200 mL premix  Status:  Discontinued       See Hyperspace for full Linked Orders Report.   1,000 mg 200 mL/hr over 60 Minutes Intravenous  Once 08/13/21 1421 08/13/21 1643   08/13/21 1430  vancomycin (VANCOCIN) IVPB 1000 mg/200 mL premix       See Hyperspace for full Linked Orders Report.   1,000 mg 200 mL/hr over 60 Minutes Intravenous  Once 08/13/21 1421 08/13/21 1637       Assessment/Plan:  POD 2 s/p irrigation and debridement of NSTI of right groin by Dr. Rosendo Gros on 08/13/21 - Cont abx. Cx pending (mod gnr, few gpc) - OR today - Mobilize, pulm toilet  FEN - NPO  VTE - SCDs, okay for chemical prophylaxis from a general surgery standpoint ID - Merrem, Linezolid Foley - None   DM2 HTN HLD Previous hx of Endocarditis MV s/p MVR Rolm Bookbinder 08/15/2021

## 2021-08-15 NOTE — TOC Progression Note (Addendum)
Transition of Care [facility]) - Progression Note    Patient Details  Name: [name] MRN: [id number] Date of Birth: Aug 13, 1965  [facility] CM/SW Contact  Jacalyn Lefevre Edson Snowball, RN Phone Number: 08/15/2021, 3:21 PM  Clinical Narrative:    Patient from home with wife. Confirmed face sheet information.   Patient needs home VAC and [place].   Placed VAC application in chart. PA aware and will sign in morning. Olivia Mackie with [facility] M aware home VAC needed.    Discussed with patient. HHRN will change home VAC three times a week.   Once 29M receives insurance authorization from [employer] for [facility] , NCM will bring home VAC to patient's room along with supplies for [facility]. Explained home VAC pump much smaller then hospital VAC pump. Patient voiced understanding.     NCM calling agencies that cover patient's address.   [facility] unable to accept due to staffing.   Jason with [facility] unable to accept.   Malachy Mood with [facility] unable to accept due to insurance   [place] with [facility] unable to accept referral due to staffing   Amy with [facility] unable to accept they are not in network with patient's insurance.   Sunday Spillers with [facility] unable to accept referral due to staffing   Levada Dy with [facility] unable to accept referral, they do not have any BCBS contracts   [place] with [facility] unable to take referral due to insurance.   Scott with [facility] unable to accept due to staffing   Marjory Lies with [facility] unable to accept patient    Anderson Malta with [facility] unable to accept referral .  Referral faxed to Kennyth Lose at [facility] phone [phone] fax [phone] she will review referral   Hoyle Sauer with [facility] reviewing referral.           Expected Discharge Plan: [place] Barriers to Discharge: Continued Medical Work up  Expected Discharge Plan and Services Expected Discharge Plan: [place]   Discharge Planning Services: CM Consult Post Acute Care Choice: [place] arrangements for the past 2 months: Single Family Home                 DME Arranged: N/A         HH Arranged: RN           Social Determinants of Health (SDOH) Interventions    Readmission Risk Interventions     View : No data to display.

## 2021-08-15 NOTE — Op Note (Signed)
Preoperative diagnosis: Necrotizing soft tissue infection right groin status post incision and debridement 2 days ago Postoperative diagnosis: Same as above Procedure: Exam under anesthesia with placement of negative pressure dressing to a 14 x 5 x 2 cm wound Surgeon: Dr. Serita Grammes Anesthesia: General Estimated blood loss minimal Specimens: None Drains: None Complications: None Sponge and counts correct completion Decision to recovery stable condition  Indications: This is a 56 year old male underwent debridement of a right groin necrotizing soft tissue infection 2 days ago.  He has been doing well.  He is return to the operating room for exam under anesthesia and possible further debridement today.  Procedure: After informed consent was obtained the patient was taken to the operating room.  He was already on antibiotics.  SCDs were placed.  He was placed under general anesthesia without complication.  He was prepped and draped in the standard sterile surgical fashion.  Surgical timeout was then performed.  His wound was clean.  There was no additional nonviable tissue.  I irrigated this.  I then placed the negative pressure dressing as above.  This had a good seal upon completion.  He tolerated this well and was transferred to recovery room stable.

## 2021-08-15 NOTE — Progress Notes (Signed)
[facility]                                                                               [name], is a 56 y.o. male, DOB - 27-Oct-1965, XFG:[id number] Admit date - 08/13/2021    Outpatient Primary MD for the patient is Orpah Melter, MD  LOS - 2  days    Brief summary   [name] is a 56 y.o. 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  S/p I&D by general surgery 5/16.   Assessment & Plan    Assessment and Plan: * Necrotizing soft tissue infection right groin S/p I&D by general surgery, Dr. Rosendo Gros on 5/16.   Patient underwent another irrigation today, underwent negative pressure dressing with good seal.  Continue with empiric antibiotics with IV linezolid and IV meropenem. Pain control and ambulation as tolerated.  Follow blood cultures, negative so far.  Cultures from the OR negative so far Pain control.    Type 2 diabetes mellitus (HCC) Insulin-dependent, uncontrolled with hyperglycemia. Started the patient on Semglee 15 units daily and continue sliding scale insulin. Hemoglobin A1c around 7. CBG (last 3)  Recent Labs    08/15/21 1001 08/15/21 1121 08/15/21 1202  GLUCAP 164* 170* 161*   No change in meds.    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 Benay Spice.  In remission per most recent oncology note.  Hyperlipidemia Statin on hold for now.  HYPERTENSION, BENIGN Blood pressure parameters well controlled.  Continue with lisinopril and metoprolol XL.  Mild thrombocytopenia:  Continue to monitor platelets.     RN Pressure Injury Documentation: Pressure Ulcer 03/01/14 Stage I -  Intact skin with non-blanchable redness of a localized area usually over a bony prominence. (Active)  03/01/14 1620  Location: Sacrum  Location Orientation: Mid  Staging: Stage I -  Intact skin with non-blanchable redness of a localized area usually over a bony prominence.  Wound Description (Comments):   Present on Admission: Yes  Contiue with wound care.      Estimated body mass index is 29.29 kg/m as calculated from the following:   Height as of this encounter: 6' (1.829 m).   Weight as of this encounter: 98 kg.  Code Status: full code.  DVT Prophylaxis:  enoxaparin (LOVENOX) injection 40 mg Start: 08/15/21 1400 SCDs Start: 08/13/21 2045   Level of Care: Level of care: Telemetry Medical Family Communication: none at bedside.   Disposition Plan:     Remains inpatient appropriate:  IV antibiotics.   Procedures:    Consultants:   General surgery.   Antimicrobials:   Anti-infectives (From admission, onward)    Start     Dose/Rate Route Frequency Ordered Stop   08/15/21 1400  ceFEPIme (MAXIPIME) 2 g in sodium chloride 0.9 % 100 mL IVPB        2  g 200 mL/hr over 30 Minutes Intravenous Every 8 hours 08/15/21 1250     08/15/21 1345  metroNIDAZOLE (FLAGYL) tablet 500 mg        500 mg Oral Every 12 hours 08/15/21 1250     08/14/21 0400  vancomycin (VANCOREADY) IVPB 1250 mg/250 mL  Status:  Discontinued        1,250 mg 166.7 mL/hr over 90 Minutes Intravenous Every 12 hours 08/13/21 1424 08/13/21 1648   08/14/21 0300  vancomycin (VANCOREADY) IVPB 1250 mg/250 mL  Status:  Discontinued        1,250 mg 166.7 mL/hr over 90 Minutes Intravenous Every 12 hours 08/13/21 2031 08/13/21 2147   08/13/21 2200  meropenem (MERREM) 1 g in sodium chloride 0.9 % 100 mL IVPB  Status:  Discontinued        1 g 200 mL/hr over 30 Minutes Intravenous Every 8 hours 08/13/21 2031 08/15/21 1250   08/13/21 1700  linezolid  (ZYVOX) IVPB 600 mg        600 mg 300 mL/hr over 60 Minutes Intravenous Every 12 hours 08/13/21 1642     08/13/21 1645  vancomycin (VANCOCIN) IVPB 1000 mg/200 mL premix  Status:  Discontinued        1,000 mg 200 mL/hr over 60 Minutes Intravenous  Once 08/13/21 1643 08/13/21 1648   08/13/21 1430  ceFEPIme (MAXIPIME) 2 g in sodium chloride 0.9 % 100 mL IVPB  Status:  Discontinued        2 g 200 mL/hr over 30 Minutes Intravenous Every 8 hours 08/13/21 1417 08/13/21 2026   08/13/21 1430  metroNIDAZOLE (FLAGYL) IVPB 500 mg  Status:  Discontinued        500 mg 100 mL/hr over 60 Minutes Intravenous Every 12 hours 08/13/21 1417 08/13/21 2026   08/13/21 1430  vancomycin (VANCOCIN) IVPB 1000 mg/200 mL premix  Status:  Discontinued       See Hyperspace for full Linked Orders Report.   1,000 mg 200 mL/hr over 60 Minutes Intravenous  Once 08/13/21 1421 08/13/21 1643   08/13/21 1430  vancomycin (VANCOCIN) IVPB 1000 mg/200 mL premix       See Hyperspace for full Linked Orders Report.   1,000 mg 200 mL/hr over 60 Minutes Intravenous  Once 08/13/21 1421 08/13/21 1637        Medications  Scheduled Meds:  chlorhexidine       enoxaparin (LOVENOX) injection  40 mg Subcutaneous Q24H   fentaNYL       insulin aspart  0-9 Units Subcutaneous Q4H   insulin glargine-yfgn  15 Units Subcutaneous Daily   metroNIDAZOLE  500 mg Oral Q12H   sodium chloride flush  3 mL Intravenous Q12H   Continuous Infusions:  ceFEPime (MAXIPIME) IV 2 g (08/15/21 1400)   linezolid (ZYVOX) IV 600 mg (08/15/21 0927)   PRN Meds:.acetaminophen **OR** acetaminophen, HYDROmorphone (DILAUDID) injection, ondansetron **OR** ondansetron (ZOFRAN) IV, senna-docusate    Subjective:   [name] was seen and examined today.   Pain not well controlled. Restarted him on IV dilaudid.  Objective:   Vitals:   08/15/21 1000 08/15/21 1120 08/15/21 1209 08/15/21 1538  BP:  (!) 139/93 (!) 140/92 129/90  Pulse: 72 79 70 66  Resp:  '15  18 16  '$ Temp: 97.9 F (36.6 C) 97.8 F (36.6 C) 98 F (36.7 C) 98.5 F (36.9 C)  TempSrc: Oral  Oral Oral  SpO2:  97% 98% 98%  Weight:      Height:  Intake/Output Summary (Last 24 hours) at 08/15/2021 1620 Last data filed at 08/15/2021 1614 Gross per 24 hour  Intake 600 ml  Output 1150 ml  Net -550 ml    Filed Weights   08/14/21 0602 08/15/21 0037 08/15/21 0957  Weight: 97.1 kg 98.3 kg 98 kg     Exam General exam: Appears calm and comfortable  Respiratory system: Clear to auscultation. Respiratory effort normal. Cardiovascular system: S1 & S2 heard, RRR. No JVD, murmurs, rubs, gallops or clicks. No pedal edema. Gastrointestinal system: Abdomen is nondistended, soft and nontender. Groin area on t he right connected to wound vac.  Central nervous system: Alert and oriented. No focal neurological deficits. Extremities: Symmetric 5 x 5 power. Skin: No rashes, lesions or ulcers Psychiatry:  Mood & affect appropriate.      Data Reviewed:  I have personally reviewed following labs and imaging studies   CBC Lab Results  Component Value Date   WBC 11.0 (H) 08/14/2021   RBC 4.33 08/14/2021   HGB 13.8 08/14/2021   HCT 40.8 08/14/2021   MCV 94.2 08/14/2021   MCH 31.9 08/14/2021   PLT 126 (L) 08/14/2021   MCHC 33.8 08/14/2021   RDW 12.7 08/14/2021   LYMPHSABS 0.7 08/13/2021   MONOABS 0.8 08/13/2021   EOSABS 0.1 08/13/2021   BASOSABS 0.0 70/03/7492     Last metabolic panel Lab Results  Component Value Date   NA 135 08/14/2021   K 4.5 08/14/2021   CL 103 08/14/2021   CO2 25 08/14/2021   BUN 13 08/14/2021   CREATININE 1.07 08/14/2021   GLUCOSE 183 (H) 08/14/2021   GFRNONAA >60 08/14/2021   GFRAA 115 02/08/2020   CALCIUM 8.7 (L) 08/14/2021   PROT 6.0 (L) 08/14/2021   ALBUMIN 2.9 (L) 08/14/2021   BILITOT 1.4 (H) 08/14/2021   ALKPHOS 39 08/14/2021   AST 28 08/14/2021   ALT 26 08/14/2021   ANIONGAP 7 08/14/2021    CBG (last 3)  Recent Labs     08/15/21 1001 08/15/21 1121 08/15/21 1202  GLUCAP 164* 170* 161*       Coagulation Profile: Recent Labs  Lab 08/13/21 1235  INR 1.1      Radiology Studies: No results found.     Hosie Poisson M.D. [facility] 08/15/2021, 4:20 PM  Available via Epic secure chat 7am-7pm After 7 pm, please refer to night coverage provider listed on amion.

## 2021-08-16 ENCOUNTER — Encounter (HOSPITAL_COMMUNITY): Payer: Self-pay | Admitting: General Surgery

## 2021-08-16 DIAGNOSIS — E782 Mixed hyperlipidemia: Secondary | ICD-10-CM | POA: Diagnosis not present

## 2021-08-16 DIAGNOSIS — E119 Type 2 diabetes mellitus without complications: Secondary | ICD-10-CM | POA: Diagnosis not present

## 2021-08-16 DIAGNOSIS — I10 Essential (primary) hypertension: Secondary | ICD-10-CM | POA: Diagnosis not present

## 2021-08-16 DIAGNOSIS — M7989 Other specified soft tissue disorders: Secondary | ICD-10-CM | POA: Diagnosis not present

## 2021-08-16 LAB — CBC WITH DIFFERENTIAL/PLATELET
Abs Immature Granulocytes: 0.04 10*3/uL (ref 0.00–0.07)
Basophils Absolute: 0 10*3/uL (ref 0.0–0.1)
Basophils Relative: 0 %
Eosinophils Absolute: 0 10*3/uL (ref 0.0–0.5)
Eosinophils Relative: 0 %
HCT: 39.5 % (ref 39.0–52.0)
Hemoglobin: 13.7 g/dL (ref 13.0–17.0)
Immature Granulocytes: 1 %
Lymphocytes Relative: 12 %
Lymphs Abs: 0.8 10*3/uL (ref 0.7–4.0)
MCH: 32.3 pg (ref 26.0–34.0)
MCHC: 34.7 g/dL (ref 30.0–36.0)
MCV: 93.2 fL (ref 80.0–100.0)
Monocytes Absolute: 0.9 10*3/uL (ref 0.1–1.0)
Monocytes Relative: 12 %
Neutro Abs: 5.4 10*3/uL (ref 1.7–7.7)
Neutrophils Relative %: 75 %
Platelets: 143 10*3/uL — ABNORMAL LOW (ref 150–400)
RBC: 4.24 MIL/uL (ref 4.22–5.81)
RDW: 12.4 % (ref 11.5–15.5)
WBC: 7.2 10*3/uL (ref 4.0–10.5)
nRBC: 0 % (ref 0.0–0.2)

## 2021-08-16 LAB — GLUCOSE, CAPILLARY
Glucose-Capillary: 173 mg/dL — ABNORMAL HIGH (ref 70–99)
Glucose-Capillary: 163 mg/dL — ABNORMAL HIGH (ref 70–99)
Glucose-Capillary: 202 mg/dL — ABNORMAL HIGH (ref 70–99)
Glucose-Capillary: 154 mg/dL — ABNORMAL HIGH (ref 70–99)
Glucose-Capillary: 207 mg/dL — ABNORMAL HIGH (ref 70–99)
Glucose-Capillary: 152 mg/dL — ABNORMAL HIGH (ref 70–99)

## 2021-08-16 LAB — BASIC METABOLIC PANEL
Anion gap: 7 (ref 5–15)
BUN: 17 mg/dL (ref 6–20)
CO2: 26 mmol/L (ref 22–32)
Calcium: 8.6 mg/dL — ABNORMAL LOW (ref 8.9–10.3)
Chloride: 104 mmol/L (ref 98–111)
Creatinine, Ser: 0.95 mg/dL (ref 0.61–1.24)
GFR, Estimated: >60 mL/min (ref >60)
Glucose, Bld: 169 mg/dL — ABNORMAL HIGH (ref 70–99)
Potassium: 4.5 mmol/L (ref 3.5–5.1)
Sodium: 137 mmol/L (ref 135–145)

## 2021-08-16 MED ORDER — MOMETASONE FURO-FORMOTEROL FUM 200-5 MCG/ACT IN AERO
2.0000 | INHALATION_SPRAY | Freq: Two times a day (BID) | RESPIRATORY_TRACT | Status: DC
  Administered 2021-08-16 – 2021-08-17 (×2): 2 via RESPIRATORY_TRACT
  Filled 2021-08-16: qty 8.8

## 2021-08-16 MED ORDER — FLUTICASONE PROPIONATE HFA 220 MCG/ACT IN AERO
2.0000 | INHALATION_SPRAY | Freq: Two times a day (BID) | RESPIRATORY_TRACT | Status: DC
  Filled 2021-08-16: qty 12

## 2021-08-16 NOTE — Progress Notes (Signed)
1 Day Post-Op  Subjective: CC: Doing well. Pain in R groin well controlled. Tolerating diet without n/v. Ambulating in halls. Voiding. Afebrile. WBC wnl.  Objective: Vital signs in last 24 hours: Temp:  [97.7 F (36.5 C)-98.6 F (37 C)] 98 F (36.7 C) (05/19 0711) Pulse Rate:  [58-79] 69 (05/19 0711) Resp:  [15-19] 16 (05/19 0711) BP: (121-142)/(74-93) 133/86 (05/19 0711) SpO2:  [95 %-99 %] 99 % (05/19 0711) Weight:  [98 kg] 98 kg (05/18 0957) Last BM Date : 08/13/21  Intake/Output from previous day: 05/18 0701 - 05/19 0700 In: 1416.8 [I.V.:500; IV Piggyback:916.8] Out: 550 [Urine:550] Intake/Output this shift: No intake/output data recorded.  PE: Gen:  Alert, NAD, pleasant Pulm: Rate and effort normal Abd: Soft, ND, NT,  GU: R groin incision w/ vac in place w/ good seal. Peri-wound without redness or induration. Vac output SS  Lab Results:  Recent Labs    08/14/21 0536 08/16/21 0302  WBC 11.0* 7.2  HGB 13.8 13.7  HCT 40.8 39.5  PLT 126* 143*   BMET Recent Labs    08/14/21 0536 08/16/21 0302  NA 135 137  K 4.5 4.5  CL 103 104  CO2 25 26  GLUCOSE 183* 169*  BUN 13 17  CREATININE 1.07 0.95  CALCIUM 8.7* 8.6*   PT/INR Recent Labs    08/13/21 1235  LABPROT 14.2  INR 1.1   CMP     Component Value Date/Time   NA 137 08/16/2021 0302   NA 136 02/08/2020 1128   K 4.5 08/16/2021 0302   CL 104 08/16/2021 0302   CO2 26 08/16/2021 0302   GLUCOSE 169 (H) 08/16/2021 0302   BUN 17 08/16/2021 0302   BUN 24 02/08/2020 1128   CREATININE 0.95 08/16/2021 0302   CREATININE 0.96 01/11/2021 0814   CREATININE 1.07 01/04/2015 0815   CALCIUM 8.6 (L) 08/16/2021 0302   PROT 6.0 (L) 08/14/2021 0536   ALBUMIN 2.9 (L) 08/14/2021 0536   AST 28 08/14/2021 0536   AST 53 (H) 08/22/2019 1118   ALT 26 08/14/2021 0536   ALT 61 (H) 08/22/2019 1118   ALKPHOS 39 08/14/2021 0536   BILITOT 1.4 (H) 08/14/2021 0536   BILITOT 1.0 08/22/2019 1118   GFRNONAA >60 08/16/2021  0302   GFRNONAA >60 01/11/2021 0814   GFRAA 115 02/08/2020 1128   GFRAA >60 08/22/2019 1118   Lipase     Component Value Date/Time   LIPASE 33 08/13/2021 1235    Studies/Results: No results found.  Anti-infectives: Anti-infectives (From admission, onward)    Start     Dose/Rate Route Frequency Ordered Stop   08/15/21 1400  ceFEPIme (MAXIPIME) 2 g in sodium chloride 0.9 % 100 mL IVPB        2 g 200 mL/hr over 30 Minutes Intravenous Every 8 hours 08/15/21 1250     08/15/21 1345  metroNIDAZOLE (FLAGYL) tablet 500 mg        500 mg Oral Every 12 hours 08/15/21 1250     08/14/21 0400  vancomycin (VANCOREADY) IVPB 1250 mg/250 mL  Status:  Discontinued        1,250 mg 166.7 mL/hr over 90 Minutes Intravenous Every 12 hours 08/13/21 1424 08/13/21 1648   08/14/21 0300  vancomycin (VANCOREADY) IVPB 1250 mg/250 mL  Status:  Discontinued        1,250 mg 166.7 mL/hr over 90 Minutes Intravenous Every 12 hours 08/13/21 2031 08/13/21 2147   08/13/21 2200  meropenem (MERREM) 1 g in  sodium chloride 0.9 % 100 mL IVPB  Status:  Discontinued        1 g 200 mL/hr over 30 Minutes Intravenous Every 8 hours 08/13/21 2031 08/15/21 1250   08/13/21 1700  linezolid (ZYVOX) IVPB 600 mg        600 mg 300 mL/hr over 60 Minutes Intravenous Every 12 hours 08/13/21 1642     08/13/21 1645  vancomycin (VANCOCIN) IVPB 1000 mg/200 mL premix  Status:  Discontinued        1,000 mg 200 mL/hr over 60 Minutes Intravenous  Once 08/13/21 1643 08/13/21 1648   08/13/21 1430  ceFEPIme (MAXIPIME) 2 g in sodium chloride 0.9 % 100 mL IVPB  Status:  Discontinued        2 g 200 mL/hr over 30 Minutes Intravenous Every 8 hours 08/13/21 1417 08/13/21 2026   08/13/21 1430  metroNIDAZOLE (FLAGYL) IVPB 500 mg  Status:  Discontinued        500 mg 100 mL/hr over 60 Minutes Intravenous Every 12 hours 08/13/21 1417 08/13/21 2026   08/13/21 1430  vancomycin (VANCOCIN) IVPB 1000 mg/200 mL premix  Status:  Discontinued       See Hyperspace  for full Linked Orders Report.   1,000 mg 200 mL/hr over 60 Minutes Intravenous  Once 08/13/21 1421 08/13/21 1643   08/13/21 1430  vancomycin (VANCOCIN) IVPB 1000 mg/200 mL premix       See Hyperspace for full Linked Orders Report.   1,000 mg 200 mL/hr over 60 Minutes Intravenous  Once 08/13/21 1421 08/13/21 1637        Assessment/Plan POD 3 s/p irrigation and debridement of NSTI of right groin by Dr. Rosendo Gros on 08/13/21 POD 1 s/p Exam under anesthesia with placement of negative pressure dressing to a 14 x 5 x 2 cm wound - On take back 5/18 His wound was clean and there was no additional nonviable tissue - Cont abx. Cx pending - Vac change Monday. Can go home with Home Vac and [facility] RN if able to be arranged before then. TOC working on arranging. Awaiting to hear back from [place] - CM VTE - SCDs, Lovenox ID - Cefepime, Linezolid, Flagyl (can narrow from our standpoint) Foley - None   DM2 HTN HLD Previous hx of Endocarditis MV s/p MVR   LOS: 3 days    Jillyn Ledger , [facility] 08/16/2021, 8:27 AM Please see Amion for pager number during day hours 7:00am-4:30pm

## 2021-08-16 NOTE — Evaluation (Signed)
Physical Therapy Evaluation Patient Details Name: [name] MRN: [id number] DOB: 1965-07-21 Today's Date: 08/16/2021  History of Present Illness  Pt is 56 yr old M admitted on 08/13/21 with c/o fever and lower abd/groin pain.  Diagnosed with necrotizing infection.  Underwent I+D R groin on 5/16. PMH: asthma, colon CA s/p colectomy, DM, HTN, Wilm's tumor kidney s/p L nephrectomy  Clinical Impression  Pt was previously independent prior to groin infection resulting in I+D with wound vac placement.  S/p procedure, pt remains independent with bed mobility, transfers and ambulation with ability to manage wound vac without assist or diffiuclty.  Pt is not appropriate for skilled PT in acute care due to independence, but is encouraged to continue to mobilize in halls throughout hospital admission.  Pt demos understanding.  Safe to return home with assist from spouse as needed.  Please reconsult PT should pt's status change.     Recommendations for follow up therapy are one component of a multi-disciplinary discharge planning process, led by the attending physician.  Recommendations may be updated based on patient status, additional functional criteria and insurance authorization.  Follow Up Recommendations No PT follow up    Assistance Recommended at Discharge None  Patient can return home with the following  Assist for transportation    Equipment Recommendations None recommended by PT  Recommendations for Other Services       Functional Status Assessment Patient has had a recent decline in their functional status and demonstrates the ability to make significant improvements in function in a reasonable and predictable amount of time.     Precautions / Restrictions Precautions Precautions: None      Mobility  Bed Mobility Overal bed mobility: Independent               Patient Response: Cooperative  Transfers Overall transfer level: Independent                       Ambulation/Gait Ambulation/Gait assistance: Independent Gait Distance (Feet): 600 Feet Assistive device: None Gait Pattern/deviations: WFL(Within Functional Limits)       General Gait Details: Pt amb around NSG floor without difficulty.  No LOB or gait deviations.  Pt states he's been amb independently since admission.  Able to manage his wound vac with independence.  Stairs            Wheelchair Mobility    Modified Rankin (Stroke Patients Only)       Balance Overall balance assessment: Independent                                           Pertinent Vitals/Pain Pain Assessment Pain Assessment: 0-10 Pain Score: 4  Pain Location: R groin Pain Descriptors / Indicators: Aching, Throbbing, Discomfort Pain Intervention(s): Patient requesting pain meds-RN notified    Home Living Family/patient expects to be discharged to:: Private residence Living Arrangements: Spouse/significant other Available Help at Discharge: Family Type of Home: House Home Access: Stairs to enter Entrance Stairs-Rails: None Entrance Stairs-Number of Steps: 2   Home Layout: One level Home Equipment: None      Prior Function Prior Level of Function : Independent/Modified Independent                     Hand Dominance        Extremity/Trunk Assessment   Upper Extremity Assessment Upper  Extremity Assessment: Overall WFL for tasks assessed    Lower Extremity Assessment Lower Extremity Assessment: Overall WFL for tasks assessed    Cervical / Trunk Assessment Cervical / Trunk Assessment: Normal  Communication   Communication: No difficulties  Cognition Arousal/Alertness: Awake/alert Behavior During Therapy: WFL for tasks assessed/performed Overall Cognitive Status: Within Functional Limits for tasks assessed                                 General Comments: Pt is supine in bed when PT arrives.  Alert and oriented x 4.  Agreeable to work  with PT.  Requesting pain meds for 4/10 groin pain, NSG notified.        General Comments      Exercises     Assessment/Plan    PT Assessment Patient does not need any further PT services  PT Problem List         PT Treatment Interventions      PT Goals (Current goals can be found in the Care Plan section)  Acute Rehab PT Goals Patient Stated Goal: Pt's goal is to return home tomorrow. PT Goal Formulation: With patient Time For Goal Achievement: 08/31/21 Potential to Achieve Goals: Good    Frequency       Co-evaluation               AM-PAC PT "6 Clicks" Mobility  Outcome Measure Help needed turning from your back to your side while in a flat bed without using bedrails?: None Help needed moving from lying on your back to sitting on the side of a flat bed without using bedrails?: None Help needed moving to and from a bed to a chair (including a wheelchair)?: None Help needed standing up from a chair using your arms (e.g., wheelchair or bedside chair)?: None Help needed to walk in hospital room?: None Help needed climbing 3-5 steps with a railing? : None 6 Click Score: 24    End of Session   Activity Tolerance: Patient tolerated treatment well Patient left: in bed Nurse Communication: Mobility status PT Visit Diagnosis: Pain Pain - Right/Left: Right Pain - part of body: Hip    Time: 9562-1308 PT Time Calculation (min) (ACUTE ONLY): 14 min   Charges:   PT Evaluation $PT Eval Low Complexity: 1 Low         Biagio Snelson A. Lannette Avellino, PT, DPT Acute Rehabilitation Services Office: [place] 08/16/2021, 10:15 AM

## 2021-08-16 NOTE — Progress Notes (Signed)
Physical Therapy Discharge Patient Details Name: [name] MRN: [id number] DOB: 04/06/1965 Today's Date: 08/16/2021 Time: 1282-0813 PT Time Calculation (min) (ACUTE ONLY): 14 min  Patient discharged from PT services secondary to goals met and no further PT needs identified.  Please see latest therapy progress note for current level of functioning and progress toward goals.    Progress and discharge plan discussed with patient and/or caregiver: Patient/Caregiver agrees with plan  GP   [name] A. Asif Muchow, PT, DPT Acute Rehabilitation Services Office: [place] 08/16/2021, 10:19 AM

## 2021-08-16 NOTE — Progress Notes (Signed)
[facility]                                                                               [name], is a 56 y.o. male, DOB - [birth year], XAJ:[id number] Admit date - 08/13/2021    Outpatient Primary MD for the patient is Orpah Melter, MD  LOS - 3  days    Brief summary   [name] is a 56 y.o. 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  S/p I&D by general surgery 5/16.   Assessment & Plan    Assessment and Plan: * Necrotizing soft tissue infection right groin S/p I&D by general surgery, Dr. Rosendo Gros on 5/16.   Patient underwent another irrigation today, underwent negative pressure dressing with good seal.  Continue with empiric antibiotics with IV linezolid and IV meropenem, transition to oral antibiotics in am depending on the cultures from the groin.  Pain control and ambulation as tolerated.  Follow blood cultures, negative so far.  Pain control.    Type 2 diabetes mellitus (HCC) Insulin-dependent, uncontrolled with hyperglycemia. Started the patient on Semglee 15 units daily and continue sliding scale insulin. Hemoglobin A1c around 7. CBG (last 3)  Recent Labs    08/16/21 0457 08/16/21 0756 08/16/21 1112  GLUCAP 173* 163* 202*   No change in meds.    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 Benay Spice.  In remission per most recent oncology note.  Hyperlipidemia Statin on hold for now.  HYPERTENSION, BENIGN Blood pressure parameters are optimal.  Continue with lisinopril and metoprolol XL.  Mild  thrombocytopenia:  Continue to monitor platelets.    RN Pressure Injury Documentation: Pressure Ulcer 03/01/14 Stage I -  Intact skin with non-blanchable redness of a localized area usually over a bony prominence. (Active)  03/01/14 1620  Location: Sacrum  Location Orientation: Mid  Staging: Stage I -  Intact skin with non-blanchable redness of a localized area usually over a bony prominence.  Wound Description (Comments):   Present on Admission: Yes  Contiue with wound care.      Estimated body mass index is 29.29 kg/m as calculated from the following:   Height as of this encounter: 6' (1.829 m).   Weight as of this encounter: 98 kg.  Code Status: full code.  DVT Prophylaxis:  enoxaparin (LOVENOX) injection 40 mg Start: 08/15/21 1400 SCDs Start: 08/13/21 2045   Level of Care: Level of care: Telemetry Medical Family Communication: none at bedside.   Disposition Plan:     Remains inpatient appropriate:  IV antibiotics.   Procedures:  I&D OF THE RIGHT GROIN ABSCESS.   Consultants:   General surgery.   Antimicrobials:   Anti-infectives (From admission, onward)    Start     Dose/Rate Route Frequency Ordered Stop   08/15/21 1400  ceFEPIme (MAXIPIME) 2 g in sodium chloride  0.9 % 100 mL IVPB        2 g 200 mL/hr over 30 Minutes Intravenous Every 8 hours 08/15/21 1250     08/15/21 1345  metroNIDAZOLE (FLAGYL) tablet 500 mg        500 mg Oral Every 12 hours 08/15/21 1250     08/14/21 0400  vancomycin (VANCOREADY) IVPB 1250 mg/250 mL  Status:  Discontinued        1,250 mg 166.7 mL/hr over 90 Minutes Intravenous Every 12 hours 08/13/21 1424 08/13/21 1648   08/14/21 0300  vancomycin (VANCOREADY) IVPB 1250 mg/250 mL  Status:  Discontinued        1,250 mg 166.7 mL/hr over 90 Minutes Intravenous Every 12 hours 08/13/21 2031 08/13/21 2147   08/13/21 2200  meropenem (MERREM) 1 g in sodium chloride 0.9 % 100 mL IVPB  Status:  Discontinued        1 g 200 mL/hr over 30 Minutes  Intravenous Every 8 hours 08/13/21 2031 08/15/21 1250   08/13/21 1700  linezolid (ZYVOX) IVPB 600 mg        600 mg 300 mL/hr over 60 Minutes Intravenous Every 12 hours 08/13/21 1642     08/13/21 1645  vancomycin (VANCOCIN) IVPB 1000 mg/200 mL premix  Status:  Discontinued        1,000 mg 200 mL/hr over 60 Minutes Intravenous  Once 08/13/21 1643 08/13/21 1648   08/13/21 1430  ceFEPIme (MAXIPIME) 2 g in sodium chloride 0.9 % 100 mL IVPB  Status:  Discontinued        2 g 200 mL/hr over 30 Minutes Intravenous Every 8 hours 08/13/21 1417 08/13/21 2026   08/13/21 1430  metroNIDAZOLE (FLAGYL) IVPB 500 mg  Status:  Discontinued        500 mg 100 mL/hr over 60 Minutes Intravenous Every 12 hours 08/13/21 1417 08/13/21 2026   08/13/21 1430  vancomycin (VANCOCIN) IVPB 1000 mg/200 mL premix  Status:  Discontinued       See Hyperspace for full Linked Orders Report.   1,000 mg 200 mL/hr over 60 Minutes Intravenous  Once 08/13/21 1421 08/13/21 1643   08/13/21 1430  vancomycin (VANCOCIN) IVPB 1000 mg/200 mL premix       See Hyperspace for full Linked Orders Report.   1,000 mg 200 mL/hr over 60 Minutes Intravenous  Once 08/13/21 1421 08/13/21 1637        Medications  Scheduled Meds:  enoxaparin (LOVENOX) injection  40 mg Subcutaneous Q24H   insulin aspart  0-9 Units Subcutaneous Q4H   insulin glargine-yfgn  15 Units Subcutaneous Daily   metroNIDAZOLE  500 mg Oral Q12H   sodium chloride flush  3 mL Intravenous Q12H   Continuous Infusions:  ceFEPime (MAXIPIME) IV 2 g (08/16/21 0506)   linezolid (ZYVOX) IV Stopped (08/16/21 1136)   PRN Meds:.acetaminophen **OR** acetaminophen, HYDROmorphone (DILAUDID) injection, ondansetron **OR** ondansetron (ZOFRAN) IV, senna-docusate    Subjective:   [name] was seen and examined today.  PAIN BETTER CONTROLLED. No chest pain or sob.   Objective:   Vitals:   08/15/21 1944 08/15/21 2346 08/16/21 0521 08/16/21 0711  BP: 130/83 121/74 137/84  133/86  Pulse: 74 (!) 58 62 69  Resp: '17 19 18 16  '$ Temp: 98.6 F (37 C) 98.1 F (36.7 C) 97.7 F (36.5 C) 98 F (36.7 C)  TempSrc: Oral Oral Oral Oral  SpO2: 97% 98% 99% 99%  Weight:      Height:  Intake/Output Summary (Last 24 hours) at 08/16/2021 1344 Last data filed at 08/16/2021 0300 Gross per 24 hour  Intake 916.81 ml  Output --  Net 916.81 ml    Filed Weights   08/14/21 0602 08/15/21 0037 08/15/21 0957  Weight: 97.1 kg 98.3 kg 98 kg     Exam General exam: Appears calm and comfortable  Respiratory system: Clear to auscultation. Respiratory effort normal. Cardiovascular system: S1 & S2 heard, RRR. No JVD,  No pedal edema. Gastrointestinal system: Abdomen is nondistended, soft and nontender. Right groin wound connected to the wound vac.  Central nervous system: Alert and oriented. No focal neurological deficits. Extremities: Symmetric 5 x 5 power. Skin: No rashes, lesions or ulcers Psychiatry: Mood & affect appropriate.       Data Reviewed:  I have personally reviewed following labs and imaging studies   CBC Lab Results  Component Value Date   WBC 7.2 08/16/2021   RBC 4.24 08/16/2021   HGB 13.7 08/16/2021   HCT 39.5 08/16/2021   MCV 93.2 08/16/2021   MCH 32.3 08/16/2021   PLT 143 (L) 08/16/2021   MCHC 34.7 08/16/2021   RDW 12.4 08/16/2021   LYMPHSABS 0.8 08/16/2021   MONOABS 0.9 08/16/2021   EOSABS 0.0 08/16/2021   BASOSABS 0.0 00/92/3300     Last metabolic panel Lab Results  Component Value Date   NA 137 08/16/2021   K 4.5 08/16/2021   CL 104 08/16/2021   CO2 26 08/16/2021   BUN 17 08/16/2021   CREATININE 0.95 08/16/2021   GLUCOSE 169 (H) 08/16/2021   GFRNONAA >60 08/16/2021   GFRAA 115 02/08/2020   CALCIUM 8.6 (L) 08/16/2021   PROT 6.0 (L) 08/14/2021   ALBUMIN 2.9 (L) 08/14/2021   BILITOT 1.4 (H) 08/14/2021   ALKPHOS 39 08/14/2021   AST 28 08/14/2021   ALT 26 08/14/2021   ANIONGAP 7 08/16/2021    CBG (last 3)  Recent Labs     08/16/21 0457 08/16/21 0756 08/16/21 1112  GLUCAP 173* 163* 202*       Coagulation Profile: Recent Labs  Lab 08/13/21 1235  INR 1.1      Radiology Studies: No results found.     Hosie Poisson M.D. [facility] 08/16/2021, 1:44 PM  Available via Epic secure chat 7am-7pm After 7 pm, please refer to night coverage provider listed on amion.

## 2021-08-16 NOTE — TOC Progression Note (Addendum)
Transition of Care [facility]) - Progression Note    Patient Details  Name: [name] MRN: [id number] Date of Birth: [birth year]  [facility] CM/SW Contact  Burgess Sheriff, Edson Snowball, RN Phone Number: 08/16/2021, 8:25 AM  Clinical Narrative:    See note from 08/15/21  Lowella Grip  at [facility] phone [phone] fax [phone] cannot accept referral.     Hoyle Sauer with [facility] reviewing referral.     Hoyle Sauer with [facility] has accepted referral for [facility] for VAC dressing changes . She is checking on start of care date for Monday or Wednesday.   Faxed application for home VAC to [facility] with [facility] P[employer] and PA aware    [facility] can start care on Monday. Home VAC at bedside .  Expected Discharge Plan: [place]: Continued Medical Work up  Expected Discharge Plan and Services Ex[place]charge Plan: [place]  Discharge Planning Services: CM Consult Post Acute Care Choice: [place] for the past 2 months: Single Family Home                 DME Arranged: N/A         HH Arranged: RN           Social Determinants of Health (SDOH) Interventions    Readmission Risk Interventions     View : No data to display.

## 2021-08-16 NOTE — Progress Notes (Signed)
Inpatient Diabetes Program Recommendations  AACE/ADA: New Consensus Statement on Inpatient Glycemic Control (2015)  Target Ranges:  Prepandial:   less than 140 mg/dL      Peak postprandial:   less than 180 mg/dL (1-2 hours)      Critically ill patients:  140 - 180 mg/dL   Lab Results  Component Value Date   GLUCAP 163 (H) 08/16/2021   HGBA1C 7.0 (H) 08/14/2021    Review of Glycemic Control  Latest Reference Range & Units 08/15/21 16:27 08/15/21 19:42 08/15/21 23:45 08/16/21 04:57 08/16/21 07:56  Glucose-Capillary 70 - 99 mg/dL 184 (H) 325 (H) 271 (H) 173 (H) 163 (H)  (H): Data is abnormally high Diabetes history: DM 2 Outpatient Diabetes medications:  Toujeo 26 units daily Metformin 1000 mg bid Prandin 2 mg bid Current orders for Inpatient glycemic control:  Novolog sensitive q 4 hours, Semglee 15 units QD   Inpatient Diabetes Program Recommendations:    Consider changing Novolog to 0-9 units TID & HS now that patient has a diet order and adding Novolog 3 units TID (assuming patient consuming >50% of meals).   Thanks, Bronson Curb, MSN, RNC-OB Diabetes Coordinator [phone] (8a-5p)

## 2021-08-17 DIAGNOSIS — L0390 Cellulitis, unspecified: Secondary | ICD-10-CM

## 2021-08-17 DIAGNOSIS — C186 Malignant neoplasm of descending colon: Secondary | ICD-10-CM

## 2021-08-17 DIAGNOSIS — M7989 Other specified soft tissue disorders: Secondary | ICD-10-CM | POA: Diagnosis not present

## 2021-08-17 DIAGNOSIS — I10 Essential (primary) hypertension: Secondary | ICD-10-CM | POA: Diagnosis not present

## 2021-08-17 LAB — GLUCOSE, CAPILLARY
Glucose-Capillary: 125 mg/dL — ABNORMAL HIGH (ref 70–99)
Glucose-Capillary: 161 mg/dL — ABNORMAL HIGH (ref 70–99)

## 2021-08-17 MED ORDER — FLUTICASONE PROPIONATE 50 MCG/ACT NA SUSP: 1.0000 | Freq: Every day | NASAL | Status: DC | PRN

## 2021-08-17 MED ORDER — DIPHENHYDRAMINE HCL 12.5 MG/5ML PO ELIX: 12.5000 mg | ORAL_SOLUTION | Freq: Four times a day (QID) | ORAL | Status: DC | PRN

## 2021-08-17 MED ORDER — LISINOPRIL 5 MG PO TABS
2.5000 mg | ORAL_TABLET | Freq: Every day | ORAL | Status: DC
  Administered 2021-08-17: 2.5 mg via ORAL
  Filled 2021-08-17: qty 1

## 2021-08-17 MED ORDER — CALCIUM CARBONATE ANTACID 500 MG PO CHEW: 500.0000 mg | CHEWABLE_TABLET | Freq: Every day | ORAL | Status: DC | PRN

## 2021-08-17 MED ORDER — AMOXICILLIN-POT CLAVULANATE 875-125 MG PO TABS: 1.0000 | ORAL_TABLET | Freq: Two times a day (BID) | ORAL | 0 refills | Status: AC

## 2021-08-17 MED ORDER — DOXYCYCLINE HYCLATE 100 MG PO TABS: 100.0000 mg | ORAL_TABLET | Freq: Two times a day (BID) | ORAL | 0 refills | Status: AC

## 2021-08-17 MED ORDER — NALOXONE HCL 0.4 MG/ML IJ SOLN: 0.4000 mg | INTRAMUSCULAR | Status: DC | PRN

## 2021-08-17 MED ORDER — METOPROLOL SUCCINATE ER 25 MG PO TB24
25.0000 mg | ORAL_TABLET | Freq: Two times a day (BID) | ORAL | Status: DC
  Administered 2021-08-17: 25 mg via ORAL
  Filled 2021-08-17: qty 1

## 2021-08-17 MED ORDER — HYDROMORPHONE 1 MG/ML IV SOLN: INTRAVENOUS | Status: DC

## 2021-08-17 MED ORDER — SODIUM CHLORIDE 0.9% FLUSH: 9.0000 mL | INTRAVENOUS | Status: DC | PRN

## 2021-08-17 MED ORDER — DIPHENHYDRAMINE HCL 50 MG/ML IJ SOLN: 12.5000 mg | Freq: Four times a day (QID) | INTRAMUSCULAR | Status: DC | PRN

## 2021-08-17 MED ORDER — ALBUTEROL SULFATE (2.5 MG/3ML) 0.083% IN NEBU: 3.0000 mL | INHALATION_SOLUTION | Freq: Four times a day (QID) | RESPIRATORY_TRACT | Status: DC | PRN

## 2021-08-17 MED ORDER — OXYCODONE HCL 5 MG PO TABS: 5.0000 mg | ORAL_TABLET | ORAL | Status: DC | PRN

## 2021-08-17 MED ORDER — AMOXICILLIN-POT CLAVULANATE 875-125 MG PO TABS
1.0000 | ORAL_TABLET | Freq: Two times a day (BID) | ORAL | Status: DC
Start: 2021-08-17 — End: 2021-08-17
  Administered 2021-08-17: 1 via ORAL
  Filled 2021-08-17: qty 1

## 2021-08-17 MED ORDER — ONDANSETRON HCL 4 MG/2ML IJ SOLN: 4.0000 mg | Freq: Four times a day (QID) | INTRAMUSCULAR | Status: DC | PRN

## 2021-08-17 MED ORDER — DOXYCYCLINE HYCLATE 100 MG PO TABS
100.0000 mg | ORAL_TABLET | Freq: Two times a day (BID) | ORAL | Status: DC
  Administered 2021-08-17: 100 mg via ORAL
  Filled 2021-08-17: qty 1

## 2021-08-17 MED ORDER — ATORVASTATIN CALCIUM 10 MG PO TABS
10.0000 mg | ORAL_TABLET | Freq: Every day | ORAL | Status: DC
  Administered 2021-08-17: 10 mg via ORAL
  Filled 2021-08-17: qty 1

## 2021-08-17 NOTE — Progress Notes (Signed)
Id brief note  [name] fasc s/p I&D by surgery improving no further surgery planned Bcx ngtd 5/16 I&D cx gnr and gpc Patient adamant on leaving today   Currently on linezolid/cefepime/flagyl   A/p Nec fasc s/p I&D Can d/c on doxy/augmentin for 10 days and have id f/u 6/1 Seek care sooner if fever, chill, worsening pain/redness/discharge  Discussed with primary team

## 2021-08-17 NOTE — Discharge Summary (Signed)
Physician Discharge Summary   Patient: [name] MRN: [id number] DOB: [birth year]  Admit date:     08/13/2021  Discharge date: 08/17/2021  Discharge Physician: Hosie Poisson   PCP: Orpah Melter, MD   Recommendations at discharge:  Please follow up with PCP in one week Please follow up with General surgery as scheduled on Monday.  Please follow up with ID as scheduled.   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  S/p I&D by general surgery 5/16.  Assessment and Plan:  Necrotizing soft tissue infection right groin S/p I&D by general surgery, Dr. Rosendo Gros on 5/16.   Patient underwent another irrigation today, underwent negative pressure dressing with good seal.  Continue with empiric antibiotics with IV antibiotics. transition to oral antibiotics in am depending on the cultures from the groin.  Pain control and ambulation as tolerated.  Follow blood cultures, negative so far.  Pain control.  Discussed with ID Dr Gale Journey , recommended oral augmentin and doxycycline for 10 days as the cultures are not back yet.  Patient to go home on wound vac to be inspected and changed on Monday outpatient.  Discussed the plan with the patient and his wife at bedside.      Type 2 diabetes mellitus  (HCC) Insulin-dependent, uncontrolled with hyperglycemia. Resume home meds on discharge.  Hemoglobin A1c around 7.    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 Benay Spice.  In remission per most recent oncology note.   Hyperlipidemia    HYPERTENSION, BENIGN Blood pressure parameters are optimal.  Continue with lisinopril and metoprolol XL.   Mild thrombocytopenia:  Continue to monitor platelets.        Consultants: gen surgery ID Dr Gale Journey Procedures performed: incision and Drainage.   Disposition: Home Diet recommendation:  Discharge Diet Orders (From admission, onward)     Start     Ordered   08/17/21 0000  Diet - low sodium heart healthy        08/17/21 0948           Carb modified diet DISCHARGE MEDICATION: Allergies as of 08/17/2021       Reactions   Empagliflozin Other (See Comments)   Recurrent UTI        Medication List     STOP taking these medications    naproxen sodium 220 MG tablet Commonly known as: ALEVE       TAKE these medications    acetaminophen 500 MG tablet Commonly known as: TYLENOL Take 1,000 mg by mouth daily as needed for mild pain.   albuterol 108 (90 Base) MCG/ACT inhaler Commonly known as: VENTOLIN HFA Inhale 1-2 puffs into the lungs every 6 (six) hours as needed for wheezing or shortness of breath.   amoxicillin-clavulanate 875-125 MG tablet Commonly known as: AUGMENTIN Take 1 tablet by mouth every 12 (twelve) hours for  10 days.   aspirin EC 81 MG tablet Take 1 tablet (81 mg total) by mouth daily. Swallow whole. What changed: additional instructions   atorvastatin 10 MG tablet Commonly known as: LIPITOR Take 1 tablet (10 mg total) by mouth daily.   calcium carbonate 750 MG chewable tablet Commonly known as: TUMS EX Chew 1,500 mg by mouth daily as needed for heartburn.   doxycycline 100 MG tablet Commonly known as: VIBRA-TABS Take 1 tablet (100 mg total)  by mouth every 12 (twelve) hours for 10 days.   FISH OIL PO Take 1,000 mg by mouth 2 (two) times daily.   fluticasone 50 MCG/ACT nasal spray Commonly known as: FLONASE Place 1 spray into both nostrils daily as needed for allergies or rhinitis.   ibuprofen 200 MG tablet Commonly known as: ADVIL Take 200 mg by mouth daily as needed for mild pain.   lisinopril 2.5 MG tablet Commonly known as: ZESTRIL Take 2.5 mg by mouth daily.   loratadine 10 MG tablet Commonly known as: CLARITIN Take 10 mg by mouth daily as needed for allergies.   metFORMIN 1000 MG tablet Commonly known as: GLUCOPHAGE Take 1 tablet (1,000 mg total) by mouth 2 (two) times daily.   metoprolol succinate 25 MG 24 hr tablet Commonly known as: TOPROL-XL Take 25 mg by mouth daily.   mometasone 220 MCG/INH inhaler Commonly known as: ASMANEX Inhale 1 puff into the lungs daily.   MULTIVITAMIN ADULT PO Take 1 tablet by mouth daily. 1st Phorm multivitamin.   ONE TOUCH ULTRA TEST test strip Generic drug: glucose blood 1 each by Other route as needed (GLUCOSE).   pioglitazone 30 MG tablet Commonly known as: ACTOS Take 30 mg by mouth daily.   repaglinide 2 MG tablet Commonly known as: PRANDIN Take 2 mg by mouth 2 (two) times daily.   Toujeo SoloStar 300 UNIT/ML Solostar Pen Generic drug: insulin glargine (1 Unit Dial) Inject 26 Units into the skin daily.               Durable Medical Equipment  (From admission, onward)           Start     Ordered   08/15/21 1410  For home use only DME Negative pressure wound device  Once       Question Answer Comment  Frequency of dressing change 3 times per week   Length of need 3 Months   Dressing type Foam   Amount of suction 125 mm/Hg   Pressure application Continuous pressure   Supplies 10 canisters and 15 dressings per month for duration of therapy      08/15/21 1412              Discharge Care Instructions  (From admission, onward)            Start     Ordered   08/17/21 0000  Discharge wound care:       Comments: Surgeon will look at the wound on Monday.   08/17/21 0948            Follow-up Information     [facility] Follow up on 09/10/2021.   Specialty: General Surgery Why: 6/13 at 8:30am. Please arrive 30 minutes prior to your appointment for paperwork. Please bring a copy of your photo ID and insurance card. Contact information: [place] [address] [phone]         [facility] Follow up.  Why: the office will call and schedule to begin dressing changes on Monday Contact information: [address] [address] [phone]         Orpah Melter, MD. Schedule an appointment as soon as possible for a visit in 1 week(s).   Specialty: Family Medicine Why: follow up the cultures Contact information: [address] [place] [address] [phone]         Jettie Booze, MD .   Specialties: Cardiology, Radiology, Interventional Cardiology Contact information: [address] [place] [address] [phone]         Jabier Mutton, MD. Schedule an appointment as soon as possible for a visit in 1 week(s).   Specialty: Infectious Diseases Contact information: [address]  [place] [address] [phone]                Discharge Exam: Danley Danker Weights   08/14/21 0602 08/15/21 0037 08/15/21 0957  Weight: 97.1 kg 98.3 kg 98 kg   General exam: Appears calm and comfortable  Respiratory system: Clear to auscultation. Respiratory effort normal. Cardiovascular system: S1 & S2 heard, RRR. No JVD, murmurs, rubs, gallops or clicks. No pedal edema. Gastrointestinal system: Abdomen is nondistended, soft and nontender. Right groin wound connected to wound vac. Normal bowel sounds heard. Central nervous system: Alert and oriented. No focal neurological  deficits. Extremities: Symmetric 5 x 5 power. Skin: No rashes, lesions or ulcers Psychiatry: Judgement and insight appear normal. Mood & affect appropriate.    Condition at discharge: fair  The results of significant diagnostics from this hospitalization (including imaging, microbiology, ancillary and laboratory) are listed below for reference.   Imaging Studies: CT ABDOMEN PELVIS W CONTRAST  Result Date: 08/13/2021 CLINICAL DATA:  Right groin abscess, concern for deep space infection. EXAM: CT ABDOMEN AND PELVIS WITH CONTRAST TECHNIQUE: Multidetector CT imaging of the abdomen and pelvis was performed using the standard protocol following bolus administration of intravenous contrast. RADIATION DOSE REDUCTION: This exam was performed according to the departmental dose-optimization program which includes automated exposure control, adjustment of the mA and/or kV according to patient size and/or use of iterative reconstruction technique. CONTRAST:  139m OMNIPAQUE IOHEXOL 300 MG/ML  SOLN COMPARISON:  CT January 11, 2021 FINDINGS: Lower chest: No acute abnormality.  Prostatic mitral valve. Hepatobiliary: No suspicious hepatic lesion. Gallbladder is unremarkable. No biliary ductal dilation. Pancreas: No pancreatic ductal dilation or evidence of acute inflammation. 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 Left adrenal gland is not well visualized. Surgical changes of prior left nephrectomy without suspicious enhancing nodularity in the nephrectomy bed. Right kidney is unremarkable without hydronephrosis or suspicious renal mass. Mild wall thickening of an incompletely distended urinary bladder. Stomach/Bowel: Postsurgical changes prior partial left  colectomy. No radiopaque enteric contrast material was administered. No pathologic dilation of small or large bowel. Normal appendix. No evidence of acute bowel inflammation. Mild symmetric wall thickening of a nondistended portion of rectum for instance on axial image 73 and sagittal image 78. Vascular/Lymphatic: Aortic and branch vessel atherosclerosis without abdominal aortic aneurysm. No pathologically enlarged abdominal or pelvic lymph nodes. Reproductive: Enlarged prostate gland measuring 6.8 x 5.8 x 4.1 cm. Other: Stranding and nonspecific fluid in the right inguinal canal with multifocal subcutaneous emphysema common no  walled off fluid collection subcutaneous soft tissue nodularity in the anterior abdominal wall for instance on image 37/2 and 44/2 likely reflect sequela of subcutaneous injections. Musculoskeletal: Advanced multilevel degenerative changes spine with dextroconvex curvature of the thoracolumbar spine. No acute osseous abnormality. IMPRESSION: 1. Subcutaneous emphysema tracking through stranding/fluid in the right inguinal canal but no walled off fluid collection to suggest abscess, suspicious for infection/cellulitis with gas-forming bacteria versus sequela of recent instrumentation for reported abscess. 2. Small bilateral hydroceles. 3. Mild symmetric wall thickening of a nondistended portion of rectum, at least somewhat accentuated by under distension and nonspecific consider correlation with direct visualization. 4. Enlarged prostate gland. 5. Surgical changes of prior left nephrectomy without suspicious enhancing nodularity in the nephrectomy bed. 6. 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 7. Aortic Atherosclerosis (ICD10-I70.0). Electronically Signed   By: Dahlia Bailiff M.D.   On: 08/13/2021 14:02     Microbiology: Results for orders placed or performed during the hospital encounter of 08/13/21  Blood culture (routine x 2)     Status: None (Preliminary result)   Collection Time: 08/13/21 12:35 PM   Specimen: Right Antecubital; Blood  Result Value Ref Range Status   Specimen Description   Final    RIGHT ANTECUBITAL Performed at [facility], [address] [place], [place] [address]    Special Requests   Final    BOTTLES DRAWN AEROBIC AND ANAEROBIC Blood Culture adequate volume Performed at [facility], [address], [place], [place] [address]    Culture   Final    NO GROWTH 4 DAYS Performed at [facility], [place] [address], [place], [place] [address]    Report Status PENDING  Incomplete  Blood culture (routine x 2)     Status: None (Preliminary result)   Collection Time: 08/13/21  1:00 PM   Specimen: Left Antecubital; Blood  Result Value Ref Range Status   Specimen Description   Final    LEFT ANTECUBITAL Performed at [facility], [address] [place], [place] [address]    Special Requests   Final    BOTTLES DRAWN AEROBIC AND ANAEROBIC Blood Culture adequate volume Performed at [facility], [address] [place], [place] [address]    Culture   Final    NO GROWTH 4 DAYS Performed at [facility], [place] [address] [place], [place] [address]    Report Status PENDING  Incomplete  Urine Culture     Status: None   Collection Time: 08/13/21  3:02 PM   Specimen: Urine, Random  Result Value Ref Range Status   Specimen Description   Final    URINE, RANDOM Performed at [facility], [address] [place], [place] [address]    Special Requests   Final    NONE Performed at [facility], [address] [place], [place] [address]    Culture   Final    NO GROWTH Performed at [facility], [place] [address] [place], [place] [address]    Report Status 08/14/2021 FINAL  Final  Resp  Panel by RT-PCR (Flu A&B, Covid) Nasopharyngeal Swab     Status: None   Collection Time: 08/13/21  3:03 PM   Specimen: Nasopharyngeal Swab; Nasopharyngeal(NP) swabs in vial transport medium  Result  Value Ref Range Status   SARS Coronavirus 2 by RT PCR NEGATIVE NEGATIVE Final    Comment: (NOTE) SARS-CoV-2 target nucleic acids are NOT DETECTED.  The SARS-CoV-2 RNA is generally detectable in upper respiratory specimens during the acute phase of infection. The lowest concentration of SARS-CoV-2 viral copies this assay can detect is 138 copies/mL. A negative result does not preclude SARS-Cov-2 infection and should not be used as the sole basis for treatment or other patient management decisions. A negative result may occur with  improper specimen collection/handling, submission of specimen other than nasopharyngeal swab, presence of viral mutation(s) within the areas targeted by this assay, and inadequate number of viral copies(<138 copies/mL). A negative result must be combined with clinical observations, patient history, and epidemiological information. The expected result is Negative.  Fact Sheet for Patients:  EntrepreneurPulse.com.au  Fact Sheet for Healthcare Providers:  IncredibleEmployment.be  This test is no t yet approved or cleared by the [place] FDA and  has been authorized for detection and/or diagnosis of SARS-CoV-2 by FDA under an Emergency Use Authorization (EUA). This EUA will remain  in effect (meaning this test can be used) for the duration of the COVID-19 declaration under Section 564(b)(1) of the Act, 21 U.S.C.section 360bbb-3(b)(1), unless the authorization is terminated  or revoked sooner.       Influenza A by PCR NEGATIVE NEGATIVE Final   Influenza B by PCR NEGATIVE NEGATIVE Final    Comment: (NOTE) The Xpert Xpress SARS-CoV-2/FLU/RSV plus assay is intended as an aid in the diagnosis of influenza from Nasopharyngeal  swab specimens and should not be used as a sole basis for treatment. Nasal washings and aspirates are unacceptable for Xpert Xpress SARS-CoV-2/FLU/RSV testing.  Fact Sheet for Patients: EntrepreneurPulse.com.au  Fact Sheet for Healthcare Providers: IncredibleEmployment.be  This test is not yet approved or cleared by the [place] FDA and has been authorized for detection and/or diagnosis of SARS-CoV-2 by FDA under an Emergency Use Authorization (EUA). This EUA will remain in effect (meaning this test can be used) for the duration of the COVID-19 declaration under Section 564(b)(1) of the Act, 21 U.S.C. section 360bbb-3(b)(1), unless the authorization is terminated or revoked.  Performed at [facility], [address] [place], [place] [address]   Aerobic/Anaerobic Culture w Gram Stain (surgical/deep wound)     Status: None (Preliminary result)   Collection Time: 08/13/21  7:39 PM   Specimen: PATH Other; Body Fluid  Result Value Ref Range Status   Specimen Description ABSCESS  Final   Special Requests RIGHT GROIN ABSCESS  Final   Gram Stain   Final    RARE WBC PRESENT,BOTH PMN AND MONONUCLEAR MODERATE GRAM NEGATIVE RODS FEW GRAM POSITIVE COCCI IN CHAINS Performed at [facility], [place] [address], [place], [place] [address]    Culture   Final    CULTURE REINCUBATED FOR BETTER GROWTH NO ANAEROBES ISOLATED; CULTURE IN PROGRESS FOR 5 DAYS    Report Status PENDING  Incomplete    Labs: CBC: Recent Labs  Lab 08/13/21 1235 08/14/21 0536 08/16/21 0302  WBC 10.7* 11.0* 7.2  NEUTROABS 9.1*  --  5.4  HGB 14.7 13.8 13.7  HCT 43.8 40.8 39.5  MCV 93.2 94.2 93.2  PLT 147* 126* 188*   Basic Metabolic Panel: Recent Labs  Lab 08/13/21 1235 08/14/21 0536 08/16/21 0302  NA 133* 135 137  K 4.3 4.5 4.5  CL 100 103 104  CO2 '25 25 26  '$ GLUCOSE 203* 183*  169*  BUN '20 13 17  '$ CREATININE 1.07 1.07 0.95  CALCIUM 9.2 8.7* 8.6*    Liver Function Tests: Recent Labs  Lab 08/13/21 1235 08/14/21 0536  AST 30 28  ALT 30 26  ALKPHOS 45 39  BILITOT 1.9* 1.4*  PROT 7.2 6.0*  ALBUMIN 3.7 2.9*   CBG: Recent Labs  Lab 08/16/21 1650 08/16/21 1934 08/16/21 2325 08/17/21 0416 08/17/21 0901  GLUCAP 154* 207* 152* 125* 161*    Discharge time spent: 52 minutes.   Signed: Hosie Poisson, MD [facility] 08/17/2021

## 2021-08-17 NOTE — Progress Notes (Signed)
[name] to be D/C'd  per MD order.  Discussed with the patient and all questions fully answered.  VSS, Skin clean, dry and intact without evidence of skin break down, no evidence of skin tears noted.  IV catheter discontinued intact. Site without signs and symptoms of complications. Dressing and pressure applied.  An After Visit Summary was printed and given to the patient.  D/c re-educated completed with patient/family including follow up instructions, medication list, d/c activities limitations if indicated, with other d/c instructions as indicated by MD - patient able to verbalize understanding, all questions fully answered. Pt and wife re-educated on wound vac and how to assemble.  Patient instructed to return to ED, call 911, or call MD for any changes in condition.   Patient to be escorted via [place], and D/C home via private auto.

## 2021-08-17 NOTE — TOC Transition Note (Signed)
Transition of Care [facility]) - CM/SW Discharge Note   Patient Details  Name: [name] MRN: [id number] Date of Birth: 30-Nov-1965  [facility] of [facility] CM/SW Contact:  Bartholomew Crews, RN Phone Number: [phone] 08/17/2021, 10:00 AM   Clinical Narrative:     Patient to transition home today. Previous RNCM had arranged for [facility] RN for dressing changes for start of care on Monday - alerted liason at [facility] of planned discharge today. Noted in previous TOC that home wound vac is at the bedside. No further TOC needs identified at this time.   Final next level of care: [place] Barriers to Discharge: No Barriers Identified   Patient Goals and CMS Choice Patient states their goals for this hospitalization and ongoing recovery are:: to return to home CMS Medicare.gov Compare Post Acute Care list provided to:: Patient Choice offered to / list presented to : Patient  Discharge Placement                       Discharge Plan and Services   Discharge Planning Services: CM Consult Post Acute Care Choice: Home Health          DME Arranged: N/A         HH Arranged: RN [place]: [facility] Date HH Agency Contacted: 08/17/21 Time [place]: [phone] Representative spoke with at [place]: [place] ([place]) Interventions     Readmission Risk Interventions     View : No data to display.

## 2021-08-18 LAB — AEROBIC/ANAEROBIC CULTURE W GRAM STAIN (SURGICAL/DEEP WOUND)

## 2021-08-18 LAB — CULTURE, BLOOD (ROUTINE X 2)
Culture: NO GROWTH
Culture: NO GROWTH
Special Requests: ADEQUATE
Special Requests: ADEQUATE

## 2021-08-19 DIAGNOSIS — E785 Hyperlipidemia, unspecified: Secondary | ICD-10-CM | POA: Diagnosis not present

## 2021-08-19 DIAGNOSIS — Z7984 Long term (current) use of oral hypoglycemic drugs: Secondary | ICD-10-CM | POA: Diagnosis not present

## 2021-08-19 DIAGNOSIS — A419 Sepsis, unspecified organism: Secondary | ICD-10-CM | POA: Diagnosis not present

## 2021-08-19 DIAGNOSIS — E119 Type 2 diabetes mellitus without complications: Secondary | ICD-10-CM | POA: Diagnosis not present

## 2021-08-19 DIAGNOSIS — Z8701 Personal history of pneumonia (recurrent): Secondary | ICD-10-CM | POA: Diagnosis not present

## 2021-08-19 DIAGNOSIS — M7989 Other specified soft tissue disorders: Secondary | ICD-10-CM | POA: Diagnosis not present

## 2021-08-19 DIAGNOSIS — I10 Essential (primary) hypertension: Secondary | ICD-10-CM | POA: Diagnosis not present

## 2021-08-19 DIAGNOSIS — Z79899 Other long term (current) drug therapy: Secondary | ICD-10-CM | POA: Diagnosis not present

## 2021-08-19 DIAGNOSIS — J309 Allergic rhinitis, unspecified: Secondary | ICD-10-CM | POA: Diagnosis not present

## 2021-08-19 DIAGNOSIS — J45909 Unspecified asthma, uncomplicated: Secondary | ICD-10-CM | POA: Diagnosis not present

## 2021-08-19 DIAGNOSIS — D696 Thrombocytopenia, unspecified: Secondary | ICD-10-CM | POA: Diagnosis not present

## 2021-08-19 DIAGNOSIS — L0390 Cellulitis, unspecified: Secondary | ICD-10-CM | POA: Diagnosis not present

## 2021-08-19 DIAGNOSIS — Z794 Long term (current) use of insulin: Secondary | ICD-10-CM | POA: Diagnosis not present

## 2021-08-21 DIAGNOSIS — A419 Sepsis, unspecified organism: Secondary | ICD-10-CM | POA: Diagnosis not present

## 2021-08-21 DIAGNOSIS — I10 Essential (primary) hypertension: Secondary | ICD-10-CM | POA: Diagnosis not present

## 2021-08-21 DIAGNOSIS — Z794 Long term (current) use of insulin: Secondary | ICD-10-CM | POA: Diagnosis not present

## 2021-08-21 DIAGNOSIS — D696 Thrombocytopenia, unspecified: Secondary | ICD-10-CM | POA: Diagnosis not present

## 2021-08-21 DIAGNOSIS — Z8701 Personal history of pneumonia (recurrent): Secondary | ICD-10-CM | POA: Diagnosis not present

## 2021-08-21 DIAGNOSIS — J309 Allergic rhinitis, unspecified: Secondary | ICD-10-CM | POA: Diagnosis not present

## 2021-08-21 DIAGNOSIS — E119 Type 2 diabetes mellitus without complications: Secondary | ICD-10-CM | POA: Diagnosis not present

## 2021-08-21 DIAGNOSIS — Z7984 Long term (current) use of oral hypoglycemic drugs: Secondary | ICD-10-CM | POA: Diagnosis not present

## 2021-08-21 DIAGNOSIS — E785 Hyperlipidemia, unspecified: Secondary | ICD-10-CM | POA: Diagnosis not present

## 2021-08-21 DIAGNOSIS — L0390 Cellulitis, unspecified: Secondary | ICD-10-CM | POA: Diagnosis not present

## 2021-08-21 DIAGNOSIS — Z79899 Other long term (current) drug therapy: Secondary | ICD-10-CM | POA: Diagnosis not present

## 2021-08-21 DIAGNOSIS — J45909 Unspecified asthma, uncomplicated: Secondary | ICD-10-CM | POA: Diagnosis not present

## 2021-08-21 DIAGNOSIS — M7989 Other specified soft tissue disorders: Secondary | ICD-10-CM | POA: Diagnosis not present

## 2021-08-22 DIAGNOSIS — S31501A Unspecified open wound of unspecified external genital organs, male, initial encounter: Secondary | ICD-10-CM | POA: Diagnosis not present

## 2021-08-23 DIAGNOSIS — Z794 Long term (current) use of insulin: Secondary | ICD-10-CM | POA: Diagnosis not present

## 2021-08-23 DIAGNOSIS — I10 Essential (primary) hypertension: Secondary | ICD-10-CM | POA: Diagnosis not present

## 2021-08-23 DIAGNOSIS — J45909 Unspecified asthma, uncomplicated: Secondary | ICD-10-CM | POA: Diagnosis not present

## 2021-08-23 DIAGNOSIS — D696 Thrombocytopenia, unspecified: Secondary | ICD-10-CM | POA: Diagnosis not present

## 2021-08-23 DIAGNOSIS — J309 Allergic rhinitis, unspecified: Secondary | ICD-10-CM | POA: Diagnosis not present

## 2021-08-23 DIAGNOSIS — M726 Necrotizing fasciitis: Secondary | ICD-10-CM | POA: Diagnosis not present

## 2021-08-23 DIAGNOSIS — E119 Type 2 diabetes mellitus without complications: Secondary | ICD-10-CM | POA: Diagnosis not present

## 2021-08-23 DIAGNOSIS — Z7984 Long term (current) use of oral hypoglycemic drugs: Secondary | ICD-10-CM | POA: Diagnosis not present

## 2021-08-23 DIAGNOSIS — Z8701 Personal history of pneumonia (recurrent): Secondary | ICD-10-CM | POA: Diagnosis not present

## 2021-08-23 DIAGNOSIS — E785 Hyperlipidemia, unspecified: Secondary | ICD-10-CM | POA: Diagnosis not present

## 2021-08-23 DIAGNOSIS — L0390 Cellulitis, unspecified: Secondary | ICD-10-CM | POA: Diagnosis not present

## 2021-08-23 DIAGNOSIS — M7989 Other specified soft tissue disorders: Secondary | ICD-10-CM | POA: Diagnosis not present

## 2021-08-23 DIAGNOSIS — Z79899 Other long term (current) drug therapy: Secondary | ICD-10-CM | POA: Diagnosis not present

## 2021-08-23 DIAGNOSIS — A419 Sepsis, unspecified organism: Secondary | ICD-10-CM | POA: Diagnosis not present

## 2021-08-25 DIAGNOSIS — I10 Essential (primary) hypertension: Secondary | ICD-10-CM | POA: Diagnosis not present

## 2021-08-25 DIAGNOSIS — E785 Hyperlipidemia, unspecified: Secondary | ICD-10-CM | POA: Diagnosis not present

## 2021-08-25 DIAGNOSIS — E119 Type 2 diabetes mellitus without complications: Secondary | ICD-10-CM | POA: Diagnosis not present

## 2021-08-25 DIAGNOSIS — Z8701 Personal history of pneumonia (recurrent): Secondary | ICD-10-CM | POA: Diagnosis not present

## 2021-08-25 DIAGNOSIS — A419 Sepsis, unspecified organism: Secondary | ICD-10-CM | POA: Diagnosis not present

## 2021-08-25 DIAGNOSIS — Z7984 Long term (current) use of oral hypoglycemic drugs: Secondary | ICD-10-CM | POA: Diagnosis not present

## 2021-08-25 DIAGNOSIS — L0390 Cellulitis, unspecified: Secondary | ICD-10-CM | POA: Diagnosis not present

## 2021-08-25 DIAGNOSIS — D696 Thrombocytopenia, unspecified: Secondary | ICD-10-CM | POA: Diagnosis not present

## 2021-08-25 DIAGNOSIS — M7989 Other specified soft tissue disorders: Secondary | ICD-10-CM | POA: Diagnosis not present

## 2021-08-25 DIAGNOSIS — J309 Allergic rhinitis, unspecified: Secondary | ICD-10-CM | POA: Diagnosis not present

## 2021-08-25 DIAGNOSIS — Z79899 Other long term (current) drug therapy: Secondary | ICD-10-CM | POA: Diagnosis not present

## 2021-08-25 DIAGNOSIS — J45909 Unspecified asthma, uncomplicated: Secondary | ICD-10-CM | POA: Diagnosis not present

## 2021-08-25 DIAGNOSIS — Z794 Long term (current) use of insulin: Secondary | ICD-10-CM | POA: Diagnosis not present

## 2021-08-28 DIAGNOSIS — D696 Thrombocytopenia, unspecified: Secondary | ICD-10-CM | POA: Diagnosis not present

## 2021-08-28 DIAGNOSIS — Z8701 Personal history of pneumonia (recurrent): Secondary | ICD-10-CM | POA: Diagnosis not present

## 2021-08-28 DIAGNOSIS — L0390 Cellulitis, unspecified: Secondary | ICD-10-CM | POA: Diagnosis not present

## 2021-08-28 DIAGNOSIS — J309 Allergic rhinitis, unspecified: Secondary | ICD-10-CM | POA: Diagnosis not present

## 2021-08-28 DIAGNOSIS — M7989 Other specified soft tissue disorders: Secondary | ICD-10-CM | POA: Diagnosis not present

## 2021-08-28 DIAGNOSIS — A419 Sepsis, unspecified organism: Secondary | ICD-10-CM | POA: Diagnosis not present

## 2021-08-28 DIAGNOSIS — Z79899 Other long term (current) drug therapy: Secondary | ICD-10-CM | POA: Diagnosis not present

## 2021-08-28 DIAGNOSIS — Z794 Long term (current) use of insulin: Secondary | ICD-10-CM | POA: Diagnosis not present

## 2021-08-28 DIAGNOSIS — E785 Hyperlipidemia, unspecified: Secondary | ICD-10-CM | POA: Diagnosis not present

## 2021-08-28 DIAGNOSIS — I10 Essential (primary) hypertension: Secondary | ICD-10-CM | POA: Diagnosis not present

## 2021-08-28 DIAGNOSIS — Z7984 Long term (current) use of oral hypoglycemic drugs: Secondary | ICD-10-CM | POA: Diagnosis not present

## 2021-08-28 DIAGNOSIS — J45909 Unspecified asthma, uncomplicated: Secondary | ICD-10-CM | POA: Diagnosis not present

## 2021-08-28 DIAGNOSIS — E119 Type 2 diabetes mellitus without complications: Secondary | ICD-10-CM | POA: Diagnosis not present

## 2021-08-29 ENCOUNTER — Ambulatory Visit: Payer: BC Managed Care – PPO | Admitting: Infectious Diseases

## 2021-08-29 ENCOUNTER — Encounter: Payer: Self-pay | Admitting: Infectious Diseases

## 2021-08-29 ENCOUNTER — Other Ambulatory Visit: Payer: Self-pay

## 2021-08-29 VITALS — BP 148/91 | HR 75 | Temp 97.9°F

## 2021-08-29 DIAGNOSIS — Z5181 Encounter for therapeutic drug level monitoring: Secondary | ICD-10-CM | POA: Diagnosis not present

## 2021-08-29 DIAGNOSIS — E119 Type 2 diabetes mellitus without complications: Secondary | ICD-10-CM

## 2021-08-29 DIAGNOSIS — M7989 Other specified soft tissue disorders: Secondary | ICD-10-CM | POA: Diagnosis not present

## 2021-08-29 DIAGNOSIS — Z9889 Other specified postprocedural states: Secondary | ICD-10-CM

## 2021-08-29 NOTE — Progress Notes (Signed)
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 03/04/2019   Cancer of descending colon s/p robotic left hemicolectomy 03/04/2019 01/26/2019   History of thyroid cancer 01/26/2019   Multiple lipomas 01/26/2019   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   Family history of colon cancer    Family history of thyroid cancer    Screen for colon cancer 01/07/2019   Type 2 diabetes mellitus ([place]) 01/11/2015   Discitis    Mitral regurgitation 07/06/2014   Discitis of lumbar region 03/08/2014   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HCC)    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[place]) 03/07/2014   Solitary kidney, acquired 03/07/2014   Malnutrition of moderate degree ([place]) 03/01/2014   CVA (cerebral infarction)- embolic 78/29/5621   Severe sepsis ([place])    Intractable back pain 02/27/2014   Hyperglycemia 02/27/2014   Hyperlipidemia 02/27/2014   Lipomatosis, 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   Family history of ischemic heart disease 04/05/2013   HYPERTENSION, BENIGN 03/12/2008   PALPITATIONS 03/12/2008    Patient's Medications  New Prescriptions   No medications on file  Previous Medications   ACETAMINOPHEN (TYLENOL) 500 MG TABLET    Take 1,000 mg by mouth daily as needed for mild pain.   ALBUTEROL (PROVENTIL HFA;VENTOLIN HFA) 108 (90 BASE) MCG/ACT INHALER    Inhale 1-2 puffs into the lungs every 6 (six) hours as needed for wheezing or shortness of breath.   ASPIRIN EC 81 MG EC TABLET    Take 1 tablet (81 mg total) by mouth daily. Swallow whole.   ATORVASTATIN (LIPITOR) 10 MG TABLET    Take 1 tablet (10 mg total) by mouth daily.   CALCIUM CARBONATE (TUMS EX) 750 MG CHEWABLE TABLET    Chew  1,500 mg by mouth daily as needed for heartburn.   FLUTICASONE (FLONASE) 50 MCG/ACT NASAL SPRAY    Place 1 spray into both nostrils daily as needed for allergies or rhinitis.   IBUPROFEN (ADVIL) 200 MG TABLET    Take 200 mg by mouth daily as needed for mild pain.   INSULIN GLARGINE, 1 UNIT DIAL, (TOUJEO SOLOSTAR) 300 UNIT/ML SOLOSTAR PEN    Inject 26 Units into the skin daily.   LISINOPRIL (ZESTRIL) 2.5 MG TABLET    Take 2.5 mg by mouth daily.   LORATADINE (CLARITIN) 10 MG TABLET    Take 10 mg by mouth daily as needed for allergies.   METFORMIN (GLUCOPHAGE) 1000 MG TABLET    Take 1 tablet (1,000 mg total) by mouth 2 (two) times daily.   METOPROLOL SUCCINATE (TOPROL-XL) 25 MG 24 HR TABLET    Take 25 mg by mouth daily.   MOMETASONE (ASMANEX) 220 MCG/INH INHALER    Inhale 1 puff into the lungs daily.   MULTIPLE VITAMIN (MULTIVITAMIN ADULT PO)    Take 1 tablet by mouth daily. 1st Phorm multivitamin.   OMEGA-3 FATTY ACIDS (FISH OIL PO)    Take 1,000 mg by mouth 2 (two) times daily.   ONE TOUCH ULTRA TEST TEST STRIP    1 each by Other route as needed (GLUCOSE).    PIOGLITAZONE (ACTOS) 30 MG TABLET    Take 30 mg by mouth daily.   REPAGLINIDE (PRANDIN) 2  MG TABLET    Take 2 mg by mouth 2 (two) times daily.  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   Patient is s/p  I and D by surgery on 5/16. OR cx grew Arcanobacterium haemolyticum.  5/18  exam under anaesthesia with no concerns for infection and placement of negative pressure wound vac. Patient was discharged on 5/20 with doxycycline and augmentin for 10 days. He has completed taking both abtx as instructed 2 days ago, had some stomach sickness while on abtx. Denies vomiting and diarrhea. Has a fu with surgeon in 2 weeks. Wound vac in place but very minimal bloody drainage. Asking if wound vac can come earlier. Feels some soreness from the surgery and wound vac but denies any severe pain/tenderness. Denies fevers, chills and sweats. He works at [employer]. Denies smoking, alcohol occasionally and denies IVDU.  Review of Systems: all systems reviewed with pertinent positives and negatives as listed above  Past Medical History:  Diagnosis Date   Allergic rhinitis    Asthma    animal dander & seasonal asthma   Colon cancer ([place]) 01/26/2019   Diabetes mellitus without complication ([place])    dx 2010  type 2   Discitis of lumbosacral region    first started with this ..and rolled onto the endocarditis    stayed a month   Endocarditis of mitral valve    11/2013 from back strep infection   Family history of colon cancer    Family history of thyroid cancer    H/O scoliosis    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   History of hiatal hernia    Hyperlipidemia    Hypertension    Diagnostic exercise tolerance test assessment:04/30/2010 : comments normal -no evidence os ischemia by ST analysis   lipoma    Lipoma 01/26/2019   Mitral regurgitation    Echo 12/2018: EF 60-65, myxomatous mitral valve, severe mitral regurgitation, partial flail leaflet of posterior mitral valve, myxomatous tricuspid valve with trivial TR, ascending aorta mildly dilated (39 mm)   Pneumonia    as child   PONV (postoperative nausea and vomiting)    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   Complex valvuloplasty including artificial Gore-tex neochord placement x6 with 28 mm Sorin Memo 4D ring annuloplasty via  right mini thoracotomy approach   Solitary kidney    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   Past Surgical History:  Procedure Laterality Date   ABDOMINAL AORTOGRAM N/A 02/10/2020   Procedure: ABDOMINAL AORTOGRAM;  Surgeon: Jettie Booze, MD;  Location: [facility];  Service: Cardiovascular;  Laterality: N/A;   APPLICATION OF WOUND VAC  08/15/2021   Procedure: APPLICATION OF WOUND VAC;  Surgeon: Rolm Bookbinder, MD;  Location: [facility];  Service: General;;   BIOPSY  01/07/2019   Procedure: BIOPSY;  Surgeon: Wilford Corner, MD;  [facility];  Service:  Endoscopy;;   BIOPSY  01/03/2020   Procedure: BIOPSY;  Surgeon: Wilford Corner, MD;  [facility];  Service: Endoscopy;;   COLON SURGERY  03/04/2019   left colon segmental resection   COLONOSCOPY WITH PROPOFOL N/A 01/07/2019   Procedure: COLONOSCOPY WITH PROPOFOL;  Surgeon: Wilford Corner, MD;  [facility];  Service: Endoscopy;  Laterality: N/A;   COLONOSCOPY WITH PROPOFOL N/A 01/03/2020   Procedure: COLONOSCOPY WITH PROPOFOL;  Surgeon: Wilford Corner, MD;  [facility];  Service: Endoscopy;  Laterality: N/A;   INCISION AND DRAINAGE OF WOUND Right 08/13/2021   Procedure: IRRIGATION AND DEBRIDEMENT WOUND RIGHT GROIN;  Surgeon: Ralene Ok, MD;  Location: [facility];  Service: General;  Laterality: Right;   INGUINAL HERNIA REPAIR Left 04/23/2016   Procedure: LAPAROSCOPIC  INGUINAL REPAIR;  Surgeon: Clovis Riley, MD;  Location: [facility];  Service: General;  Laterality: Left;   IRRIGATION AND DEBRIDEMENT ABSCESS Right 08/15/2021   Procedure: IRRIGATION AND DEBRIDEMENT OF GROIN;  Surgeon: Rolm Bookbinder, MD;  Location: [facility];  Service: General;  Laterality: Right;   KNEE SURGERY     arthroscopic left knee   LIPOMA EXCISION  2015   x20 [facility]   MITRAL VALVE REPAIR Right 05/09/2020   Procedure: MINIMALLY INVASIVE MITRAL VALVE REPAIR (MVR) USING  LIVANOVA MEMO 4D 28MM MITRAL RING;  Surgeon: [name], MD;  Location: [facility];  Service: Open Heart Surgery;  Laterality: Right;   POLYPECTOMY  01/03/2020   Procedure: POLYPECTOMY;  Surgeon: Wilford Corner, MD;  [facility];  Service: Endoscopy;;   PORT-A-CATH REMOVAL     PORTACATH PLACEMENT N/A 03/16/2019   Procedure: INSERTION PORT-A-CATH;  Surgeon: Michael Boston, MD;  [facility];  Service: General;  Laterality: N/A;   RIGHT/LEFT HEART CATH AND CORONARY ANGIOGRAPHY N/A 02/10/2020   Procedure: RIGHT/LEFT HEART CATH AND CORONARY ANGIOGRAPHY;  Surgeon: Jettie Booze, MD;  Location: [facility];  Service: Cardiovascular;  Laterality: N/A;   SUBMUCOSAL INJECTION  01/07/2019   Procedure: SUBMUCOSAL INJECTION;  Surgeon: Wilford Corner, MD;  [facility];  Service: Endoscopy;;   TEE WITHOUT CARDIOVERSION N/A 02/10/2020   Procedure: TRANSESOPHAGEAL ECHOCARDIOGRAM (TEE);  Surgeon: Acie Fredrickson Wonda Cheng, MD;  Location: [facility];  Service: Cardiovascular;  Laterality: N/A;  CATH AFTER TEE   TEE WITHOUT CARDIOVERSION N/A 05/09/2020   Procedure: TRANSESOPHAGEAL ECHOCARDIOGRAM (TEE);  Surgeon: [name], MD;  Location: [facility];  Service: Open Heart Surgery;  Laterality: N/A;   THYROIDECTOMY, PARTIAL  01/05/2009   Right Thyroid Lobectomy   TONSILLECTOMY     TOTAL NEPHRECTOMY Left 1970   Wilms Tumor left kidney excised as an infant    Social History   Tobacco Use   Smoking status: Never   Smokeless tobacco: Never  Vaping Use   Vaping Use: Never used  Substance Use Topics   Alcohol use: Yes    Alcohol/week: 1.0 standard drink    Types: 1 Glasses of wine per week    Comment: occasional   Drug use: No    Family History  Problem Relation Age of Onset   Heart disease Father    Hernia Father    Healthy Sister    Stroke Paternal Grandmother    Cancer Mother        THYROID   Hypotension Mother    Heart attack Maternal Grandfather     Congestive Heart Failure Maternal Grandfather    Heart attack Paternal Grandfather    Colon cancer Maternal Aunt 79  Congestive Heart Failure Paternal Uncle    Congestive Heart Failure Maternal Grandmother    Colon cancer Other 54       MGMs brother   Colon cancer Other        MGFs brother    Allergies  Allergen Reactions   Empagliflozin Other (See Comments)    Recurrent UTI    Health Maintenance  Topic Date Due   FOOT EXAM  Never done   OPHTHALMOLOGY EXAM  Never done   Zoster Vaccines- Shingrix (1 of 2) Never done   TETANUS/TDAP  09/17/2016   COVID-19 Vaccine (4 - Booster for Moderna series) 04/03/2020   INFLUENZA VACCINE  10/29/2021   HEMOGLOBIN A1C  02/14/2022   COLONOSCOPY (Pts 45-20yr Insurance coverage will need to be confirmed)  01/02/2030   Hepatitis C Screening  Completed   HIV Screening  Completed   HPV VACCINES  Aged Out    Objective: BP (!) 148/91   Pulse 75   Temp 97.9 F (36.6 C) (Oral)   SpO2 99%    Physical Exam Constitutional:      Appearance: Normal appearance.  HENT:     Head: Normocephalic and atraumatic.      Mouth: Mucous membranes are moist.  Eyes:    Conjunctiva/sclera: Conjunctivae normal.     Pupils: P  Cardiovascular:     Rate and Rhythm: Normal rate and regular rhythm.     Heart sounds: murmur+  Pulmonary:     Effort: Pulmonary effort is normal.     Breath sounds: Normal breath sounds.   Abdominal:     General: Non distended     Palpations: soft.   GU- lipoma in the rt groin ( known to patient )    Musculoskeletal:        General: Normal range of motion.   Skin:    General: Skin is warm and dry.     Comments:  Neurological:     General: grossly non focal     Mental Status: awake, alert and oriented to person, place, and time.   Psychiatric:        Mood and Affect: Mood normal.   Lab Results Lab Results  Component Value Date   WBC 7.2 08/16/2021   HGB 13.7 08/16/2021   HCT 39.5 08/16/2021   MCV 93.2  08/16/2021   PLT 143 (L) 08/16/2021    Lab Results  Component Value Date   CREATININE 0.95 08/16/2021   BUN 17 08/16/2021   NA 137 08/16/2021   K 4.5 08/16/2021   CL 104 08/16/2021   CO2 26 08/16/2021    Lab Results  Component Value Date   ALT 26 08/14/2021   AST 28 08/14/2021   ALKPHOS 39 08/14/2021   BILITOT 1.4 (H) 08/14/2021    Lab Results  Component Value Date   CHOL 116 03/05/2014   HDL 21 (L) 03/05/2014   LDLCALC 71 03/05/2014   TRIG 122 03/05/2014   CHOLHDL 5.5 03/05/2014   No results found for: LABRPR, RPRTITER No results found for: HIV1RNAQUANT, HIV1RNAVL, CD4TABS   Imaging CT ABDOMEN PELVIS W CONTRAST  Result Date: 08/13/2021 CLINICAL DATA:  Right groin abscess, concern for deep space infection. EXAM: CT ABDOMEN AND PELVIS WITH CONTRAST TECHNIQUE: Multidetector CT imaging of the abdomen and pelvis was performed using the standard protocol following bolus administration of intravenous contrast. RADIATION DOSE REDUCTION: This exam was performed according to the departmental dose-optimization program which includes automated exposure control, adjustment of the mA and/or kV  according to patient size and/or use of iterative reconstruction technique. CONTRAST:  141m OMNIPAQUE IOHEXOL 300 MG/ML  SOLN COMPARISON:  CT January 11, 2021 FINDINGS: Lower chest: No acute abnormality.  Prostatic mitral valve. Hepatobiliary: No suspicious hepatic lesion. Gallbladder is unremarkable. No biliary ductal dilation. Pancreas: No pancreatic ductal dilation or evidence of acute inflammation. 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 Left adrenal gland is not well visualized. Surgical changes of prior left nephrectomy without suspicious  enhancing nodularity in the nephrectomy bed. Right kidney is unremarkable without hydronephrosis or suspicious renal mass. Mild wall thickening of an incompletely distended urinary bladder. Stomach/Bowel: Postsurgical changes prior partial left colectomy. No radiopaque enteric contrast material was administered. No pathologic dilation of small or large bowel. Normal appendix. No evidence of acute bowel inflammation. Mild symmetric wall thickening of a nondistended portion of rectum for instance on axial image 73 and sagittal image 78. Vascular/Lymphatic: Aortic and branch vessel atherosclerosis without abdominal aortic aneurysm. No pathologically enlarged abdominal or pelvic lymph nodes. Reproductive: Enlarged prostate gland measuring 6.8 x 5.8 x 4.1 cm. Other: Stranding and nonspecific fluid in the right inguinal canal with multifocal subcutaneous emphysema common no walled off fluid collection subcutaneous soft tissue nodularity in the anterior abdominal wall for instance on image 37/2 and 44/2 likely reflect sequela of subcutaneous injections. Musculoskeletal: Advanced multilevel degenerative changes spine with dextroconvex curvature of the thoracolumbar spine. No acute osseous abnormality. IMPRESSION: 1. Subcutaneous emphysema tracking through stranding/fluid in the right inguinal canal but no walled off fluid collection to suggest abscess, suspicious for infection/cellulitis with gas-forming bacteria versus sequela of recent instrumentation for reported abscess. 2. Small bilateral hydroceles. 3. Mild symmetric wall thickening of a nondistended portion of rectum, at least somewhat accentuated by under distension and nonspecific consider correlation with direct visualization. 4. Enlarged prostate gland. 5. Surgical changes of prior left nephrectomy without suspicious enhancing nodularity in the nephrectomy bed. 6. 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 7. Aortic Atherosclerosis (ICD10-I70.0). Electronically Signed   By: JDahlia BailiffM.D.   On: 08/13/2021 14:02      Problem List Items Addressed This Visit       Other   S/P MVR (mitral valve repair)   Necrotizing soft tissue infection right groin - Primary   Medication monitoring encounter   Assessment/Plan # Necrotizing Fascitis of rt groin  - Completed abtx 2 days ago - Clinically rt groin is healing well with no concerns for infection - No indication to restart antibiotics  - Fu with surgery  - Fu as needed  # DM 2 - Discussed BG control  # Medication Monitoring Off abtx already, no need for  labs   I have personally spent 70 minutes involved in face-to-face and non-face-to-face activities for this patient on the day of the visit. Professional time spent includes the following activities: Preparing to see the patient (review of tests), Obtaining and/or reviewing separately obtained history (admission/discharge record), Performing a medically appropriate examination and/or evaluation , Ordering medications/tests/procedures, referring and communicating with other health care professionals, Documenting clinical information in the EMR, Independently interpreting results (not separately reported), Communicating results to the patient/family/caregiver, Counseling and educating the patient/family/caregiver and Care  coordination (not separately reported).   Wilber Oliphant, [facility] 08/29/2021, 11:08 AM

## 2021-08-30 DIAGNOSIS — Z794 Long term (current) use of insulin: Secondary | ICD-10-CM | POA: Diagnosis not present

## 2021-08-30 DIAGNOSIS — E119 Type 2 diabetes mellitus without complications: Secondary | ICD-10-CM | POA: Diagnosis not present

## 2021-08-30 DIAGNOSIS — J45909 Unspecified asthma, uncomplicated: Secondary | ICD-10-CM | POA: Diagnosis not present

## 2021-08-30 DIAGNOSIS — Z8701 Personal history of pneumonia (recurrent): Secondary | ICD-10-CM | POA: Diagnosis not present

## 2021-08-30 DIAGNOSIS — J309 Allergic rhinitis, unspecified: Secondary | ICD-10-CM | POA: Diagnosis not present

## 2021-08-30 DIAGNOSIS — L0390 Cellulitis, unspecified: Secondary | ICD-10-CM | POA: Diagnosis not present

## 2021-08-30 DIAGNOSIS — A419 Sepsis, unspecified organism: Secondary | ICD-10-CM | POA: Diagnosis not present

## 2021-08-30 DIAGNOSIS — I10 Essential (primary) hypertension: Secondary | ICD-10-CM | POA: Diagnosis not present

## 2021-08-30 DIAGNOSIS — E785 Hyperlipidemia, unspecified: Secondary | ICD-10-CM | POA: Diagnosis not present

## 2021-08-30 DIAGNOSIS — Z7984 Long term (current) use of oral hypoglycemic drugs: Secondary | ICD-10-CM | POA: Diagnosis not present

## 2021-08-30 DIAGNOSIS — D696 Thrombocytopenia, unspecified: Secondary | ICD-10-CM | POA: Diagnosis not present

## 2021-08-30 DIAGNOSIS — Z79899 Other long term (current) drug therapy: Secondary | ICD-10-CM | POA: Diagnosis not present

## 2021-08-30 DIAGNOSIS — M7989 Other specified soft tissue disorders: Secondary | ICD-10-CM | POA: Diagnosis not present

## 2021-09-02 DIAGNOSIS — A419 Sepsis, unspecified organism: Secondary | ICD-10-CM | POA: Diagnosis not present

## 2021-09-02 DIAGNOSIS — Z79899 Other long term (current) drug therapy: Secondary | ICD-10-CM | POA: Diagnosis not present

## 2021-09-02 DIAGNOSIS — I10 Essential (primary) hypertension: Secondary | ICD-10-CM | POA: Diagnosis not present

## 2021-09-02 DIAGNOSIS — Z8701 Personal history of pneumonia (recurrent): Secondary | ICD-10-CM | POA: Diagnosis not present

## 2021-09-02 DIAGNOSIS — Z7984 Long term (current) use of oral hypoglycemic drugs: Secondary | ICD-10-CM | POA: Diagnosis not present

## 2021-09-02 DIAGNOSIS — E785 Hyperlipidemia, unspecified: Secondary | ICD-10-CM | POA: Diagnosis not present

## 2021-09-02 DIAGNOSIS — J309 Allergic rhinitis, unspecified: Secondary | ICD-10-CM | POA: Diagnosis not present

## 2021-09-02 DIAGNOSIS — J45909 Unspecified asthma, uncomplicated: Secondary | ICD-10-CM | POA: Diagnosis not present

## 2021-09-02 DIAGNOSIS — D696 Thrombocytopenia, unspecified: Secondary | ICD-10-CM | POA: Diagnosis not present

## 2021-09-02 DIAGNOSIS — M7989 Other specified soft tissue disorders: Secondary | ICD-10-CM | POA: Diagnosis not present

## 2021-09-02 DIAGNOSIS — Z794 Long term (current) use of insulin: Secondary | ICD-10-CM | POA: Diagnosis not present

## 2021-09-02 DIAGNOSIS — E119 Type 2 diabetes mellitus without complications: Secondary | ICD-10-CM | POA: Diagnosis not present

## 2021-09-02 DIAGNOSIS — L0390 Cellulitis, unspecified: Secondary | ICD-10-CM | POA: Diagnosis not present

## 2021-09-04 DIAGNOSIS — Z794 Long term (current) use of insulin: Secondary | ICD-10-CM | POA: Diagnosis not present

## 2021-09-04 DIAGNOSIS — Z7984 Long term (current) use of oral hypoglycemic drugs: Secondary | ICD-10-CM | POA: Diagnosis not present

## 2021-09-04 DIAGNOSIS — E785 Hyperlipidemia, unspecified: Secondary | ICD-10-CM | POA: Diagnosis not present

## 2021-09-04 DIAGNOSIS — J45909 Unspecified asthma, uncomplicated: Secondary | ICD-10-CM | POA: Diagnosis not present

## 2021-09-04 DIAGNOSIS — I10 Essential (primary) hypertension: Secondary | ICD-10-CM | POA: Diagnosis not present

## 2021-09-04 DIAGNOSIS — J309 Allergic rhinitis, unspecified: Secondary | ICD-10-CM | POA: Diagnosis not present

## 2021-09-04 DIAGNOSIS — Z79899 Other long term (current) drug therapy: Secondary | ICD-10-CM | POA: Diagnosis not present

## 2021-09-04 DIAGNOSIS — E119 Type 2 diabetes mellitus without complications: Secondary | ICD-10-CM | POA: Diagnosis not present

## 2021-09-04 DIAGNOSIS — D696 Thrombocytopenia, unspecified: Secondary | ICD-10-CM | POA: Diagnosis not present

## 2021-09-04 DIAGNOSIS — L0390 Cellulitis, unspecified: Secondary | ICD-10-CM | POA: Diagnosis not present

## 2021-09-04 DIAGNOSIS — A419 Sepsis, unspecified organism: Secondary | ICD-10-CM | POA: Diagnosis not present

## 2021-09-04 DIAGNOSIS — M7989 Other specified soft tissue disorders: Secondary | ICD-10-CM | POA: Diagnosis not present

## 2021-09-04 DIAGNOSIS — Z8701 Personal history of pneumonia (recurrent): Secondary | ICD-10-CM | POA: Diagnosis not present

## 2021-10-02 DIAGNOSIS — E119 Type 2 diabetes mellitus without complications: Secondary | ICD-10-CM | POA: Diagnosis not present

## 2021-10-09 DIAGNOSIS — L814 Other melanin hyperpigmentation: Secondary | ICD-10-CM | POA: Diagnosis not present

## 2021-10-09 DIAGNOSIS — L738 Other specified follicular disorders: Secondary | ICD-10-CM | POA: Diagnosis not present

## 2021-10-09 DIAGNOSIS — X32XXXS Exposure to sunlight, sequela: Secondary | ICD-10-CM | POA: Diagnosis not present

## 2021-10-09 DIAGNOSIS — L700 Acne vulgaris: Secondary | ICD-10-CM | POA: Diagnosis not present

## 2021-10-14 DIAGNOSIS — C61 Malignant neoplasm of prostate: Secondary | ICD-10-CM | POA: Diagnosis not present

## 2021-10-21 ENCOUNTER — Other Ambulatory Visit: Payer: Self-pay

## 2021-10-22 DIAGNOSIS — D49512 Neoplasm of unspecified behavior of left kidney: Secondary | ICD-10-CM | POA: Diagnosis not present

## 2021-10-22 DIAGNOSIS — N500 Atrophy of testis: Secondary | ICD-10-CM | POA: Diagnosis not present

## 2021-10-22 DIAGNOSIS — C61 Malignant neoplasm of prostate: Secondary | ICD-10-CM | POA: Diagnosis not present

## 2021-10-29 ENCOUNTER — Other Ambulatory Visit: Payer: Self-pay

## 2021-11-01 NOTE — Progress Notes (Signed)
Office Visit    Patient Name: [name] Date of Encounter: 11/07/2021  Primary Care Provider:  Orpah Melter, MD Primary Cardiologist:  Larae Grooms, MD Primary Electrophysiologist: None  Chief Complaint    [name] is a 56 y.o. 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   History of hiatal hernia    Hyperlipidemia    Hypertension    Diagnostic exercise tolerance test assessment:04/30/2010 : comments normal -no evidence os ischemia by ST analysis   lipoma    Lipoma 01/26/2019   Mitral regurgitation    Echo 12/2018: EF 60-65, myxomatous mitral valve, severe mitral regurgitation, partial flail leaflet of posterior mitral valve, myxomatous tricuspid valve with trivial TR, ascending aorta mildly dilated (39 mm)   Pneumonia    as child   PONV (postoperative nausea and vomiting)    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   Complex valvuloplasty including artificial Gore-tex neochord placement x6 with 28 mm Sorin Memo 4D ring annuloplasty via right mini thoracotomy approach   Solitary kidney    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   Past  Surgical History:  Procedure Laterality Date   ABDOMINAL AORTOGRAM N/A 02/10/2020   Procedure: ABDOMINAL AORTOGRAM;  Surgeon: Jettie Booze, MD;  Location: [facility];  Service: Cardiovascular;  Laterality: N/A;   APPLICATION OF WOUND VAC  08/15/2021   Procedure: APPLICATION OF WOUND VAC;  Surgeon: Rolm Bookbinder, MD;  Location: [facility];  Service: General;;   BIOPSY  01/07/2019   Procedure: BIOPSY;  Surgeon: Wilford Corner, MD;  Location: [facility];  Service: Endoscopy;;   BIOPSY  01/03/2020   Procedure: BIOPSY;  Surgeon: Wilford Corner, MD;  [facility]: [facility];  Service: Endoscopy;;   COLON SURGERY  03/04/2019   left colon segmental resection   COLONOSCOPY WITH PROPOFOL N/A 01/07/2019   Procedure: COLONOSCOPY WITH PROPOFOL;  Surgeon: Wilford Corner, MD;  [facility];  Service: Endoscopy;  Laterality: N/A;   COLONOSCOPY WITH PROPOFOL N/A 01/03/2020   Procedure: COLONOSCOPY WITH PROPOFOL;  Surgeon: Wilford Corner, MD;  [facility];  Service: Endoscopy;  Laterality: N/A;   INCISION AND DRAINAGE OF WOUND Right 08/13/2021   Procedure: IRRIGATION AND DEBRIDEMENT WOUND RIGHT GROIN;  Surgeon: Ralene Ok, MD;  Location: [facility];  Service: General;  Laterality: Right;   INGUINAL HERNIA REPAIR Left 04/23/2016   Procedure: LAPAROSCOPIC  INGUINAL REPAIR;  Surgeon: Clovis Riley, MD;  Location: [facility];  Service: General;  Laterality: Left;   IRRIGATION AND DEBRIDEMENT ABSCESS Right 08/15/2021  Procedure: IRRIGATION AND DEBRIDEMENT OF GROIN;  Surgeon: Rolm Bookbinder, MD;  Location: [facility];  Service: General;  Laterality: Right;   KNEE SURGERY     arthroscopic left knee   LIPOMA EXCISION  2015   x20 [facility]   MITRAL VALVE REPAIR Right 05/09/2020   Procedure: MINIMALLY INVASIVE MITRAL VALVE REPAIR (MVR) USING LIVANOVA MEMO 4D 28MM MITRAL RING;  Surgeon: [name], MD;  Location: [place] [facility];  Service: Open Heart Surgery;  Laterality: Right;    POLYPECTOMY  01/03/2020   Procedure: POLYPECTOMY;  Surgeon: Wilford Corner, MD;  [facility];  Service: Endoscopy;;   PORT-A-CATH REMOVAL     PORTACATH PLACEMENT N/A 03/16/2019   Procedure: INSERTION PORT-A-CATH;  Surgeon: Michael Boston, MD;  [facility]: [facility];  Service: General;  Laterality: N/A;   RIGHT/LEFT HEART CATH AND CORONARY ANGIOGRAPHY N/A 02/10/2020   Procedure: RIGHT/LEFT HEART CATH AND CORONARY ANGIOGRAPHY;  Surgeon: Jettie Booze, MD;  Location: [facility];  Service: Cardiovascular;  Laterality: N/A;   SUBMUCOSAL INJECTION  01/07/2019   Procedure: SUBMUCOSAL INJECTION;  Surgeon: Wilford Corner, MD;  [facility];  Service: Endoscopy;;   TEE WITHOUT CARDIOVERSION N/A 02/10/2020   Procedure: TRANSESOPHAGEAL ECHOCARDIOGRAM (TEE);  Surgeon: Acie Fredrickson Wonda Cheng, MD;  Location: [facility];  Service: Cardiovascular;  Laterality: N/A;  CATH AFTER TEE   TEE WITHOUT CARDIOVERSION N/A 05/09/2020   Procedure: TRANSESOPHAGEAL ECHOCARDIOGRAM (TEE);  Surgeon: [name], MD;  Location: [facility];  Service: Open Heart Surgery;  Laterality: N/A;   THYROIDECTOMY, PARTIAL  01/05/2009   Right Thyroid Lobectomy   TONSILLECTOMY     TOTAL NEPHRECTOMY Left 1970   Wilms Tumor left kidney excised as an infant    Allergies  Allergies  Allergen Reactions   Empagliflozin Other (See Comments)    Recurrent UTI    History of Present Illness    [name] is a 56 year old male with the above-mentioned past medical history who presents today for annual follow-up of mitral valve and hypertension.  TEE completed in 01/2020 showed severe mitral valve regurg with flail segment and EF of 60-65%.  R/L heart cath was performed that revealed normal coronaries and heart pressures.  He underwent MV repair 10/03/2829 that was complicated by postop atrial fibrillation that was treated with amiodarone and Lopressor with no recurrence.  2D echo was repeated in 2022 with an EF of  55-60%, no RWMA, grade 2 DD, with elevated LA pressure and normal pulmonary artery systolic pressure.He was last seen by Dr. Irish Lack in 07/2020 and was doing well from a cardiac perspective.  No medication changes were made at that time and plan was to stop Xarelto if atrial fibrillation does not recur.  Since last being seen in the office patient reports that he has been doing well and denies any chest pain or shortness of breath.  He does report increased swelling in his left lower extremity he feels may be related to chronic knee injury.  He is quite active and remains busy with activities.  His blood pressure has been elevated over the last few months with home readings between 517/616 systolically.  He was euvolemic on examination denies any shortness of breath or difficulty lying flat.  Patient denies chest pain, palpitations, dyspnea, PND, orthopnea, nausea, vomiting, dizziness, syncope, edema, weight gain, or early satiety.  Home Medications    Current Outpatient Medications  Medication Sig Dispense Refill   acetaminophen (TYLENOL) 500 MG tablet Take 1,000 mg by mouth daily as  needed for mild pain.     albuterol (PROVENTIL HFA;VENTOLIN HFA) 108 (90 BASE) MCG/ACT inhaler Inhale 1-2 puffs into the lungs every 6 (six) hours as needed for wheezing or shortness of breath.     aspirin EC 81 MG EC tablet Take 1 tablet (81 mg total) by mouth daily. Swallow whole. (Patient taking differently: Take 81 mg by mouth daily.) 30 tablet 11   atorvastatin (LIPITOR) 10 MG tablet Take 1 tablet (10 mg total) by mouth daily. 30 tablet 1   calcium carbonate (TUMS EX) 750 MG chewable tablet Chew 1,500 mg by mouth daily as needed for heartburn.     fluticasone (FLONASE) 50 MCG/ACT nasal spray Place 1 spray into both nostrils daily as needed for allergies or rhinitis.     furosemide (LASIX) 20 MG tablet Take 1 tablet (20 mg total) by mouth as needed. 30 tablet 3   ibuprofen (ADVIL) 200 MG tablet Take 200 mg by mouth  daily as needed for mild pain.     insulin glargine, 1 Unit Dial, (TOUJEO SOLOSTAR) 300 UNIT/ML Solostar Pen Inject 26 Units into the skin daily.     lisinopril (ZESTRIL) 5 MG tablet Take 1 tablet (5 mg total) by mouth daily. 90 tablet 0   loratadine (CLARITIN) 10 MG tablet Take 10 mg by mouth daily as needed for allergies.     metFORMIN (GLUCOPHAGE) 1000 MG tablet Take 1 tablet (1,000 mg total) by mouth 2 (two) times daily.     metoprolol succinate (TOPROL-XL) 25 MG 24 hr tablet Take 25 mg by mouth daily.     mometasone (ASMANEX) 220 MCG/INH inhaler Inhale 1 puff into the lungs daily.     Multiple Vitamin (MULTIVITAMIN ADULT PO) Take 1 tablet by mouth daily. 1st Phorm multivitamin.     Omega-3 Fatty Acids (FISH OIL PO) Take 1,000 mg by mouth 2 (two) times daily.     ONE TOUCH ULTRA TEST test strip 1 each by Other route as needed (GLUCOSE).      pioglitazone (ACTOS) 30 MG tablet Take 30 mg by mouth daily.     repaglinide (PRANDIN) 2 MG tablet Take 2 mg by mouth 2 (two) times daily.     No current facility-administered medications for this visit.     Review of Systems  Please see the history of present illness.    (+) Swelling (+) Chronic knee pain  All other systems reviewed and are otherwise negative except as noted above.  Physical Exam    Wt Readings from Last 3 Encounters:  11/07/21 218 lb 6.4 oz (99.1 kg)  08/15/21 216 lb (98 kg)  07/19/21 214 lb (97.1 kg)   VS: Vitals:   11/07/21 1459  BP: (!) 138/90  Pulse: 78  SpO2: 98%  ,Body mass index is 29.62 kg/m.  Constitutional:      Appearance: Healthy appearance. Not in distress.  Neck:     Vascular: JVD normal.  Pulmonary:     Effort: Pulmonary effort is normal.     Breath sounds: No wheezing. No rales. Diminished in the bases Cardiovascular:     Normal rate. Regular rhythm. Normal S1. Normal S2.      Murmurs: There is no murmur.  Edema:    2+ on the left lower leg of peripheral edema and 1+ on the right lower  extremity.  Abdominal:     Palpations: Abdomen is soft non tender. There is no hepatomegaly.  Skin:    General: Skin is warm and dry.  Neurological:     General: No focal deficit present.     Mental Status: Alert and oriented to person, place and time.     Cranial Nerves: Cranial nerves are intact.  EKG/LABS/Other Studies Reviewed    ECG personally reviewed by me today -none completed today      Lab Results  Component Value Date   WBC 7.2 08/16/2021   HGB 13.7 08/16/2021   HCT 39.5 08/16/2021   MCV 93.2 08/16/2021   PLT 143 (L) 08/16/2021   Lab Results  Component Value Date   CREATININE 0.95 08/16/2021   BUN 17 08/16/2021   NA 137 08/16/2021   K 4.5 08/16/2021   CL 104 08/16/2021   CO2 26 08/16/2021   Lab Results  Component Value Date   ALT 26 08/14/2021   AST 28 08/14/2021   ALKPHOS 39 08/14/2021   BILITOT 1.4 (H) 08/14/2021   Lab Results  Component Value Date   CHOL 116 03/05/2014   HDL 21 (L) 03/05/2014   LDLCALC 71 03/05/2014   TRIG 122 03/05/2014   CHOLHDL 5.5 03/05/2014    Lab Results  Component Value Date   HGBA1C 7.0 (H) 08/14/2021    Assessment & Plan    1.  History of mitral regurgitation: -2D echo completed with an EF of 55-60%, no RWMA, grade 2 DD, with elevated LA pressure and normal pulmonary artery systolic pressure. -s/p MV repair 05/09/2020, patient reports no residual effects or shortness of breath -SBE prophylaxis discussed prior to any planned dental procedures  2.  Essential hypertension: -Patient's blood pressures have been elevated with a reading today of 138/90 -We will increase lisinopril to 5 mg daily -Repeat BMET in 2 weeks -Patient advised to document blood pressures and return readings in the next 2 weeks  3.  DM type II: -Blood sugars are monitored and managed per PCP  4.  Hyperlipidemia: -Patient's last LDL cholesterol was 71 slightly above goal of less than 70 -Continue atorvastatin 10 mg daily  5.  Atrial  fibrillation/flutter: -He denies any palpitations and has not felt any accelerated heart rates.  He continues to use his Apple Watch for monitoring atrial fibrillation and reports no reoccurrence. -Patient is currently off anticoagulation -Continue Toprol 25 mg daily  6.  Lower extremity edema: -Patient has 2+ lower extremity edema in his left lower extremity and 1+ in his right -I encouraged him to elevate his extremities when dependent and also wear compression stockings -Lasix 20 mg as needed for increased swelling -I encouraged patient to follow-up with PCP regarding referral to vein and vascular services      Disposition: Follow-up with Larae Grooms, MD or APP in 12 months or as Planned    Medication Adjustments/Labs and Tests Ordered: Current medicines are reviewed at length with the patient today.  Concerns regarding medicines are outlined above.   Signed, Mable Fill, Marissa Nestle, NP 11/07/2021, 4:36 PM [place]

## 2021-11-07 ENCOUNTER — Encounter: Payer: Self-pay | Admitting: Nurse Practitioner

## 2021-11-07 ENCOUNTER — Ambulatory Visit: Payer: BC Managed Care – PPO | Admitting: Nurse Practitioner

## 2021-11-07 VITALS — BP 138/90 | HR 78 | Ht 72.0 in | Wt 218.4 lb

## 2021-11-07 DIAGNOSIS — R600 Localized edema: Secondary | ICD-10-CM

## 2021-11-07 DIAGNOSIS — I10 Essential (primary) hypertension: Secondary | ICD-10-CM

## 2021-11-07 DIAGNOSIS — E782 Mixed hyperlipidemia: Secondary | ICD-10-CM | POA: Diagnosis not present

## 2021-11-07 DIAGNOSIS — Z9889 Other specified postprocedural states: Secondary | ICD-10-CM | POA: Diagnosis not present

## 2021-11-07 DIAGNOSIS — I480 Paroxysmal atrial fibrillation: Secondary | ICD-10-CM

## 2021-11-07 DIAGNOSIS — I340 Nonrheumatic mitral (valve) insufficiency: Secondary | ICD-10-CM

## 2021-11-07 MED ORDER — LISINOPRIL 5 MG PO TABS: 5.0000 mg | ORAL_TABLET | Freq: Every day | ORAL | 0 refills | Status: DC

## 2021-11-07 MED ORDER — FUROSEMIDE 20 MG PO TABS: 20.0000 mg | ORAL_TABLET | ORAL | 3 refills | Status: DC | PRN

## 2021-11-07 NOTE — Patient Instructions (Signed)
Medication Instructions:  INCREASE LISINOPRIL TO 5 MG EVERY DAY START FUROSEMIDE 20 MG AS NEEDED *If you need a refill on your cardiac medications before your next appointment, please call your pharmacy*   Lab Work: BMET IN 2 WEEKS DUE 8/24/ If you have labs (blood work) drawn today and your tests are completely normal, you will receive your results only by: [place] (if you have MyChart) OR A paper copy in the mail If you have any lab test that is abnormal or we need to change your treatment, we will call you to review the results.   Testing/Procedures: NONE   Follow-Up: At [facility], you and your health needs are our priority.  As part of our continuing mission to provide you with exceptional heart care, we have created designated Provider Care Teams.  These Care Teams include your primary Cardiologist (physician) and Advanced Practice Providers (APPs -  Physician Assistants and Nurse Practitioners) who all work together to provide you with the care you need, when you need it.  We recommend signing up for the patient portal called "MyChart".  Sign up information is provided on this After Visit Summary.  MyChart is used to connect with patients for Virtual Visits (Telemedicine).  Patients are able to view lab/test results, encounter notes, upcoming appointments, etc.  Non-urgent messages can be sent to your provider as well.   To learn more about what you can do with MyChart, go to [email or web address].    Your next appointment:   AS PLANNED  The format for your next appointment:     Provider:       Other Instructions CHECK B/P FOR 2 WEEKS AND SEND READING VIA [place]

## 2021-12-04 DIAGNOSIS — Z23 Encounter for immunization: Secondary | ICD-10-CM | POA: Diagnosis not present

## 2021-12-04 DIAGNOSIS — R600 Localized edema: Secondary | ICD-10-CM | POA: Diagnosis not present

## 2021-12-12 ENCOUNTER — Other Ambulatory Visit: Payer: Self-pay | Admitting: Family Medicine

## 2021-12-12 DIAGNOSIS — R600 Localized edema: Secondary | ICD-10-CM

## 2021-12-13 ENCOUNTER — Ambulatory Visit (INDEPENDENT_AMBULATORY_CARE_PROVIDER_SITE_OTHER): Payer: BC Managed Care – PPO

## 2021-12-13 DIAGNOSIS — I82412 Acute embolism and thrombosis of left femoral vein: Secondary | ICD-10-CM | POA: Diagnosis not present

## 2021-12-13 DIAGNOSIS — I824Z2 Acute embolism and thrombosis of unspecified deep veins of left distal lower extremity: Secondary | ICD-10-CM | POA: Diagnosis not present

## 2021-12-13 DIAGNOSIS — R600 Localized edema: Secondary | ICD-10-CM

## 2021-12-13 DIAGNOSIS — I82442 Acute embolism and thrombosis of left tibial vein: Secondary | ICD-10-CM | POA: Diagnosis not present

## 2021-12-29 ENCOUNTER — Emergency Department (HOSPITAL_COMMUNITY): Payer: BC Managed Care – PPO

## 2021-12-29 ENCOUNTER — Emergency Department (HOSPITAL_COMMUNITY)
Admission: EM | Admit: 2021-12-29 | Discharge: 2021-12-29 | Disposition: A | Discharge status: Home / Self Care | Payer: BC Managed Care – PPO | Attending: Emergency Medicine | Admitting: Emergency Medicine

## 2021-12-29 ENCOUNTER — Encounter (HOSPITAL_COMMUNITY): Payer: Self-pay

## 2021-12-29 DIAGNOSIS — X58XXXA Exposure to other specified factors, initial encounter: Secondary | ICD-10-CM | POA: Insufficient documentation

## 2021-12-29 DIAGNOSIS — S199XXA Unspecified injury of neck, initial encounter: Secondary | ICD-10-CM | POA: Diagnosis not present

## 2021-12-29 DIAGNOSIS — Z7982 Long term (current) use of aspirin: Secondary | ICD-10-CM | POA: Insufficient documentation

## 2021-12-29 DIAGNOSIS — S161XXA Strain of muscle, fascia and tendon at neck level, initial encounter: Secondary | ICD-10-CM | POA: Insufficient documentation

## 2021-12-29 DIAGNOSIS — R0602 Shortness of breath: Secondary | ICD-10-CM | POA: Diagnosis not present

## 2021-12-29 LAB — COMPREHENSIVE METABOLIC PANEL
ALT: 29 U/L (ref 0–44)
AST: 27 U/L (ref 15–41)
Albumin: 3.7 g/dL (ref 3.5–5.0)
Alkaline Phosphatase: 43 U/L (ref 38–126)
Anion gap: 7 (ref 5–15)
BUN: 18 mg/dL (ref 6–20)
CO2: 25 mmol/L (ref 22–32)
Calcium: 9.2 mg/dL (ref 8.9–10.3)
Chloride: 105 mmol/L (ref 98–111)
Creatinine, Ser: 0.99 mg/dL (ref 0.61–1.24)
GFR, Estimated: >60 mL/min (ref >60)
Glucose, Bld: 180 mg/dL — ABNORMAL HIGH (ref 70–99)
Potassium: 4.8 mmol/L (ref 3.5–5.1)
Sodium: 137 mmol/L (ref 135–145)
Total Bilirubin: 1.1 mg/dL (ref 0.3–1.2)
Total Protein: 7.1 g/dL (ref 6.5–8.1)

## 2021-12-29 LAB — CBC WITH DIFFERENTIAL/PLATELET
Abs Immature Granulocytes: 0.02 10*3/uL (ref 0.00–0.07)
Basophils Absolute: 0.1 10*3/uL (ref 0.0–0.1)
Basophils Relative: 1 %
Eosinophils Absolute: 0.3 10*3/uL (ref 0.0–0.5)
Eosinophils Relative: 5 %
HCT: 45.9 % (ref 39.0–52.0)
Hemoglobin: 15.4 g/dL (ref 13.0–17.0)
Immature Granulocytes: 0 %
Lymphocytes Relative: 18 %
Lymphs Abs: 1 10*3/uL (ref 0.7–4.0)
MCH: 31.6 pg (ref 26.0–34.0)
MCHC: 33.6 g/dL (ref 30.0–36.0)
MCV: 94.1 fL (ref 80.0–100.0)
Monocytes Absolute: 0.6 10*3/uL (ref 0.1–1.0)
Monocytes Relative: 10 %
Neutro Abs: 3.7 10*3/uL (ref 1.7–7.7)
Neutrophils Relative %: 66 %
Platelets: 153 10*3/uL (ref 150–400)
RBC: 4.88 MIL/uL (ref 4.22–5.81)
RDW: 12.8 % (ref 11.5–15.5)
WBC: 5.7 10*3/uL (ref 4.0–10.5)
nRBC: 0 % (ref 0.0–0.2)

## 2021-12-29 LAB — TROPONIN I (HIGH SENSITIVITY): Troponin I (High Sensitivity): 5 ng/L (ref <18)

## 2021-12-29 MED ORDER — LIDOCAINE 5 % EX PTCH: 1.0000 | MEDICATED_PATCH | CUTANEOUS | 0 refills | Status: DC

## 2021-12-29 MED ORDER — ACETAMINOPHEN 500 MG PO TABS: 500.0000 mg | ORAL_TABLET | Freq: Every day | ORAL | 0 refills | Status: DC | PRN

## 2021-12-29 MED ORDER — NAPROXEN 500 MG PO TABS: 500.0000 mg | ORAL_TABLET | Freq: Once | ORAL | Status: DC

## 2021-12-29 MED ORDER — METHOCARBAMOL 500 MG PO TABS: 500.0000 mg | ORAL_TABLET | Freq: Two times a day (BID) | ORAL | 0 refills | Status: DC

## 2021-12-29 MED ORDER — METHOCARBAMOL 500 MG PO TABS
1000.0000 mg | ORAL_TABLET | Freq: Once | ORAL | Status: AC
  Administered 2021-12-29: 1000 mg via ORAL
  Filled 2021-12-29: qty 2

## 2021-12-29 NOTE — ED Provider Triage Note (Signed)
Emergency Medicine Provider Triage Evaluation Note  [name] , a 56 y.o. male  was evaluated in triage.  Pt complains of shortness of breath and left-sided neck pain.  Patient states that he recently DVT and has been on Pradaxa for the past 2 weeks.  He notes left-sided neck pain after awakening 3 days ago with some subjective swelling.  He states the pain is responded well to ibuprofen; pain is worsened with movement of neck.  Denies fever, swelling left upper extremity, numbness.  Denies fever, chills, night sweats, chest pain.  Review of Systems  Positive: See above Negative:   Physical Exam  BP (!) 157/101 (BP Location: Left Arm)   Pulse 72   Temp 99 F (37.2 C) (Oral)   Resp 16   SpO2 98%  Gen:   Awake, no distress   Resp:  Normal effort  MSK:   Moves extremities without difficulty  Other:  Radial pulses full and intact bilaterally.  Patient has mild tenderness to palpation along left trapezial ridge.  Pain exacerbated with rotation of neck.  Upper extremity swelling noted.  Patient has 1-2+ pitting edema left lower extremity secondary to DVT.  Lungs clear to auscultation.  Medical Decision Making  Medically screening exam initiated at 12:10 PM.  Appropriate orders placed.  [name] was informed that the remainder of the evaluation will be completed by another provider, this initial triage assessment does not replace that evaluation, and the importance of remaining in the ED until their evaluation is complete.     Wilnette Kales, [place] 12/29/21 1212

## 2021-12-29 NOTE — ED Triage Notes (Signed)
Pt presents with c/o left side neck pain. Pt recently diagnosed with a DVT in the left leg approx 2 weeks ago. Pt traveled for work within the last week and since then has had pain in the left side of his neck traveling to the left shoulder. Area does appear swollen.

## 2021-12-29 NOTE — ED Notes (Signed)
Patient Alert and oriented to baseline. Stable and ambulatory to baseline. Patient verbalized understanding of the discharge instructions.  Patient belongings were taken by the patient.   

## 2021-12-29 NOTE — ED Provider Notes (Signed)
[facility] DEPT Provider Note   CSN: [id number] Arrival date & time: 12/29/21  1113     History  Chief Complaint  Patient presents with   Neck Pain    [name] is a 56 y.o. male presenting today due to left neck pain.  He attributes this to multiple weeks of long plane rides and car rides.  Recently diagnosed with DVT and started on Pradaxa.  Currently denies any chest pain or shortness of breath.  No syncope, dizziness or lightheadedness.  Says that his pain in his neck is worse with any type of movement.  He tried Voltaren gel, IcyHot, heat packs and naproxen without relief.  No fever, headache, blurred vision, difficulty breathing or swallowing.   Neck Pain Associated symptoms: no chest pain and no weakness        Home Medications Prior to Admission medications   Medication Sig Start Date End Date Taking? Authorizing Provider  acetaminophen (TYLENOL) 500 MG tablet Take 1,000 mg by mouth daily as needed for mild pain.    [provider]  albuterol (PROVENTIL HFA;VENTOLIN HFA) 108 (90 BASE) MCG/ACT inhaler Inhale 1-2 puffs into the lungs every 6 (six) hours as needed for wheezing or shortness of breath.    [provider]  aspirin EC 81 MG EC tablet Take 1 tablet (81 mg total) by mouth daily. Swallow whole. Patient taking differently: Take 81 mg by mouth daily. 05/15/20   Antony Odea, PA-C  atorvastatin (LIPITOR) 10 MG tablet Take 1 tablet (10 mg total) by mouth daily. 03/15/14   Angiulli, Lavon Paganini, PA-C  calcium carbonate (TUMS EX) 750 MG chewable tablet Chew 1,500 mg by mouth daily as needed for heartburn.    [provider]  fluticasone (FLONASE) 50 MCG/ACT nasal spray Place 1 spray into both nostrils daily as needed for allergies or rhinitis.    [provider]  furosemide (LASIX) 20 MG tablet Take 1 tablet (20 mg total) by mouth as needed. 11/07/21   Marylu Lund., NP  ibuprofen (ADVIL) 200  MG tablet Take 200 mg by mouth daily as needed for mild pain.    [provider]  insulin glargine, 1 Unit Dial, (TOUJEO SOLOSTAR) 300 UNIT/ML Solostar Pen Inject 26 Units into the skin daily. 04/05/21   [provider]  lisinopril (ZESTRIL) 5 MG tablet Take 1 tablet (5 mg total) by mouth daily. 11/07/21 02/05/22  Marylu Lund., NP  loratadine (CLARITIN) 10 MG tablet Take 10 mg by mouth daily as needed for allergies.    [provider]  metFORMIN (GLUCOPHAGE) 1000 MG tablet Take 1 tablet (1,000 mg total) by mouth 2 (two) times daily. 02/12/20   Jettie Booze, MD  metoprolol succinate (TOPROL-XL) 25 MG 24 hr tablet Take 25 mg by mouth daily.    [provider]  mometasone (ASMANEX) 220 MCG/INH inhaler Inhale 1 puff into the lungs daily.    [provider]  Multiple Vitamin (MULTIVITAMIN ADULT PO) Take 1 tablet by mouth daily. 1st Phorm multivitamin.    [provider]  Omega-3 Fatty Acids (FISH OIL PO) Take 1,000 mg by mouth 2 (two) times daily.    [provider]  ONE TOUCH ULTRA TEST test strip 1 each by Other route as needed (GLUCOSE).  05/06/15   [provider]  pioglitazone (ACTOS) 30 MG tablet Take 30 mg by mouth daily.    [provider]  repaglinide (PRANDIN) 2 MG tablet  Take 2 mg by mouth 2 (two) times daily. 05/16/20   [provider]      Allergies    Empagliflozin    Review of Systems   Review of Systems  Respiratory:  Negative for cough, chest tightness and shortness of breath.   Cardiovascular:  Negative for chest pain and palpitations.  Musculoskeletal:  Positive for myalgias and neck pain.  Neurological:  Negative for dizziness, syncope, weakness and light-headedness.    Physical Exam Updated Vital Signs BP (!) 157/101 (BP Location: Left Arm)   Pulse 72   Temp 99 F (37.2 C) (Oral)   Resp 16   SpO2 98%  Physical Exam Vitals and nursing note reviewed.  Constitutional:       Appearance: Normal appearance.  HENT:     Head: Normocephalic and atraumatic.  Eyes:     General: No scleral icterus.    Conjunctiva/sclera: Conjunctivae normal.  Cardiovascular:     Rate and Rhythm: Normal rate and regular rhythm.  Pulmonary:     Effort: Pulmonary effort is normal. No respiratory distress.     Breath sounds: No wheezing.  Musculoskeletal:        General: Tenderness present. No swelling. Normal range of motion.     Right lower leg: No edema.     Left lower leg: No edema.     Comments: Full range of motion of the C-spine.  Some mild TTP of the left trapezius as well as the sternal branch of the left left SCM.  Skin:    Findings: No rash.  Neurological:     Mental Status: He is alert.  Psychiatric:        Mood and Affect: Mood normal.     ED Results / Procedures / Treatments   Labs (all labs ordered are listed, but only abnormal results are displayed) Labs Reviewed  CBC WITH DIFFERENTIAL/PLATELET  COMPREHENSIVE METABOLIC PANEL  TROPONIN I (HIGH SENSITIVITY)    EKG None  Radiology DG Chest Port 1 View  Result Date: 12/29/2021 CLINICAL DATA:  Shortness of breath EXAM: PORTABLE CHEST 1 VIEW COMPARISON:  06/11/2020 FINDINGS: Borderline heart size accentuated by portable technique. Stable mediastinal contours. There is no edema, consolidation, effusion, or pneumothorax. IMPRESSION: No evidence of active disease. Electronically Signed   By: Jorje Guild M.D.   On: 12/29/2021 12:53    Procedures Procedures   Medications Ordered in ED Medications  methocarbamol (ROBAXIN) tablet 1,000 mg (has no administration in time range)    ED Course/ Medical Decision Making/ A&P                           Medical Decision Making Risk OTC drugs. Prescription drug management.   This is a 56 year old male presenting today with complaint of neck pain.  Differential includes but not limited to neck strain, torticollis, meningitis or radiculopathy.   This is not an  exhaustive differential.    Past Medical History / Co-morbidities / Social History:  DVT on Pradaxa  Additional history: Per chart review patient has a history of diabetes that is well controlled.  Also on Pradaxa but previously was on Xarelto for A-fib.  Does not appear to be taking this medication any longer.   Physical Exam: Pertinent physical exam findings include Reproducible tenderness over the left trapezius and left external branch of the SCM.  Full range of motion of the neck.  No photophobia or midline neck tenderness.  No limitations of upper  extremity range of motion.  Radial pulses present and equal bilaterally  Imaging Studies: Chest x-ray was ordered in triage.  I viewed and interpreted this and agree with radiologist that there are not any findings.   Medications: I ordered medication including Robaxin.  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  Do not believe NSAIDs are indicated.     MDM/Disposition: This is a 56 year old male presenting with left neck pain.  Reports its been worsening over the past couple of weeks.  Reports multiple plane rides and long car rides and concern for a "crick in his neck."  This was not getting much better with Voltaren cream, IcyHot, heat packs and naproxen.  He denied any chest pain, shortness of breath, lightheadedness or dizziness.  Cardiac work-up was ordered in triage.  Patient's presentation is not consistent with any cardiac etiology however I did wait for these labs because they had previously been ordered.  These were all negative to include patient's x-ray.  After patient's work-up today, I feel that he is stable for discharge home.  I considered further work-up because of his recent diagnosis of DVT however he has no signs or symptoms of PE, CVA or other occlusive etiology of his pain.  Reproducible on physical exam and I believe it is likely musculoskeletal discomfort.  He and his wife are agreeable to discharge.   Given follow-up with orthopedics.  Of note all labs are pending at the time of patient's discharge.  I was going to wait for these however patient says that he believes it is not necessary for him to have any results and will follow-up online.  This feels reasonable as this is likely a musculoskeletal etiology and not cardiac.  Discharged per her request.    Final Clinical Impression(s) / ED Diagnoses Final diagnoses:  Acute strain of neck muscle, initial encounter    Rx / DC Orders ED Discharge Orders          Ordered    methocarbamol (ROBAXIN) 500 MG tablet  2 times daily        12/29/21 1312    lidocaine (LIDODERM) 5 %  Every 24 hours        12/29/21 1312    acetaminophen (TYLENOL) 500 MG tablet  Daily PRN        12/29/21 1312           Results and diagnoses were explained to the patient. Return precautions discussed in full. Patient had no additional questions and expressed complete understanding.   This chart was dictated using voice recognition software.  Despite best efforts to proofread,  errors can occur which can change the documentation meaning.    Darliss Ridgel 12/29/21 1349    Davonna Belling, MD 12/29/21 [phone]

## 2021-12-29 NOTE — Discharge Instructions (Addendum)
You have strained a muscle in your neck.  I have sent methocarbamol which is a muscle relaxant to your pharmacy.  I have also sent lidocaine patches to your pharmacy.  Remember to not drive on the methocarbamol and only have the lidocaine patches on for 12 hours at a time.  You may use up to 3 patches at once.  I have attached an orthopedic doctor for you to call for your knee and potentially follow-up on your neck pain if it is not resolving.  Continue to use Tylenol however you should not be taking naproxen or ibuprofen with your blood thinner.  Speak with your PCP about whether or not NSAIDs are safe for you to take with your 1 kidney.  You do not have any signs of pulmonary embolus today.  Please return if you have chest pain, worse when you are breathing, dizziness, feeling as though you may pass out or any other worsening or concerning symptoms.  Your chest x-ray is normal

## 2022-01-10 ENCOUNTER — Ambulatory Visit (INDEPENDENT_AMBULATORY_CARE_PROVIDER_SITE_OTHER): Payer: BC Managed Care – PPO

## 2022-01-10 ENCOUNTER — Ambulatory Visit (INDEPENDENT_AMBULATORY_CARE_PROVIDER_SITE_OTHER): Payer: BC Managed Care – PPO | Admitting: Orthopedic Surgery

## 2022-01-10 DIAGNOSIS — M25562 Pain in left knee: Secondary | ICD-10-CM

## 2022-01-10 DIAGNOSIS — M79602 Pain in left arm: Secondary | ICD-10-CM

## 2022-01-10 MED ORDER — METHOCARBAMOL 500 MG PO TABS: 500.0000 mg | ORAL_TABLET | Freq: Three times a day (TID) | ORAL | 0 refills | Status: DC | PRN

## 2022-01-12 ENCOUNTER — Encounter: Payer: Self-pay | Admitting: Orthopedic Surgery

## 2022-01-12 NOTE — Progress Notes (Signed)
Office Visit Note   Patient: [name]           Date of Birth: 07/12/1965           MRN: [id number] Visit Date: 01/10/2022 Requested by: Orpah Melter, MD [address] [place],  [place] [address] PCP: Orpah Melter, MD  Subjective: Chief Complaint  Patient presents with   Left Knee - Pain   Left Shoulder - Pain    HPI: [name] is a 56 y.o. male who presents to the office reporting multiple orthopedic complaints today.  Patient describes left shoulder pain which is shooting sharp pain with numbness and tingling in the hand and shoulder with radicular component.  Reports scapular pain.  He is right-hand dominant.  Also reports neck pain.  Describes good range of motion of the shoulder.  Most of the pain occurs at night.  The pain does wake him from sleep.  Does get relief by putting his arm up over his head.  Patient also reports left knee pain.  He states that he "blew out my knee in college".  Played a lot of baseball and basketball.  Reports some weakness and giving way.  Wears a brace which is an arthritis brace.  That helps his knee.  Notably he was diagnosed with left lower extremity DVT a month ago.  Imaging studies and reports were reviewed and this was a common vein obstruction moving distally.  He is on blood thinners for that problem.  He is a Voltaren gel and muscle relaxer for his symptoms.  Does a lot of travel for work.  Goes to the gym daily.  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                ROS: All systems reviewed are negative as they relate to the chief complaint within the history of present illness.  Patient denies fevers or chills.  Assessment & Plan: Visit Diagnoses:  1. Left arm pain   2. Left knee pain, unspecified chronicity     Plan: Impression is left shoulder and neck pain which looks like radiculopathy.  Ongoing for 6 weeks.  Radiates down the  posterior aspect of the arm into the forearm.  Wakes him from sleep at night.  Has failed conservative management.  Plan MRI cervical spine to evaluate left-sided radiculopathy.  Regarding the knee this is something that has severe arthritis but he is very functional with the knee.  He does have a history of prior ligamentous injury but currently his knee is stable primarily because of the arthritis.  We will get a try some Robaxin to see if that helps with his neck symptoms and no intervention required for the knee at this time although he may need knee replacement at sometime in the future when his clinical symptoms worsen.  Follow-Up Instructions: No follow-ups on file.   Orders:  Orders Placed This Encounter  Procedures   XR Shoulder Left   XR Cervical Spine 2 or 3 views   XR KNEE 3 VIEW LEFT   MR Cervical Spine w/o contrast   Meds ordered this encounter  Medications   methocarbamol (ROBAXIN) 500 MG tablet    Sig: Take 1 tablet (500 mg total) by mouth every 8 (eight) hours as needed for muscle spasms.    Dispense:  30 tablet    Refill:  0      Procedures: No procedures performed   Clinical Data: No additional findings.  Objective: Vital Signs: There were no vitals taken for this visit.  Physical Exam:  Constitutional: Patient appears well-developed HEENT:  Head: Normocephalic Eyes:EOM are normal Neck: Normal range of motion Cardiovascular: Normal rate Pulmonary/chest: Effort normal Neurologic: Patient is alert Skin: Skin is warm Psychiatric: Patient has normal mood and affect  Ortho Exam: Ortho exam demonstrates intact extensor mechanism in the left knee.  Collaterals are stable.  Not too much in terms of asymmetric ACL laxity left versus right.  Trace effusion present.  No groin pain on the left with internal/external rotation of the leg.  No other masses lymphadenopathy or skin changes noted in that left knee region.  Cervical spine range of motion demonstrates  flexion within 1 inch chin to chest extension 30 degrees rotation is about 45 degrees bilaterally.  5 out of 5 grip EPL FPL interosseous wrist flexion extension bicep triceps and deltoid strength with very good rotator cuff strength infraspinatus extremity subscap muscle testing on the left-hand side.  Symmetric passive range of motion to 60/95/165 bilaterally.  No coarse grinding or crepitus in the left with internal/external Tatian leg.  No definite paresthesias C5-T1 with symmetric reflexes bilateral biceps and triceps.  Specialty Comments:  No specialty comments available.  Imaging: No results found.   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   Family history of colon cancer    Family history of thyroid cancer    Screen for colon cancer 01/07/2019   Type 2 diabetes mellitus ([facility]) 01/11/2015   Mitral regurgitation 07/06/2014   Discitis of lumbar region 03/08/2014   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[facility])    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   Solitary kidney, acquired 03/07/2014   Malnutrition of moderate degree ([facility]) 03/01/2014   CVA (cerebral infarction)- embolic 95/62/1308   Severe sepsis ([facility])    Intractable back pain 02/27/2014   Hyperglycemia 02/27/2014   Hyperlipidemia 02/27/2014   Lipomatosis, 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   Family history of ischemic heart disease 04/05/2013    HYPERTENSION, BENIGN 03/12/2008   PALPITATIONS 03/12/2008   Past Medical History:  Diagnosis Date   Allergic rhinitis    Asthma    animal dander & seasonal asthma   Colon cancer ([facility]) 01/26/2019   Diabetes mellitus without complication ([facility])    dx 2010  type 2   Discitis of lumbosacral region    first started with this ..and rolled onto the endocarditis    stayed a month   Endocarditis of mitral valve    11/2013 from back strep infection   Family history of colon cancer    Family history of thyroid cancer    H/O scoliosis    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   History of hiatal hernia    Hyperlipidemia    Hypertension    Diagnostic exercise tolerance test assessment:04/30/2010 : comments normal -no evidence os  ischemia by ST analysis   lipoma    Lipoma 01/26/2019   Mitral regurgitation    Echo 12/2018: EF 60-65, myxomatous mitral valve, severe mitral regurgitation, partial flail leaflet of posterior mitral valve, myxomatous tricuspid valve with trivial TR, ascending aorta mildly dilated (39 mm)   Pneumonia    as child   PONV (postoperative nausea and vomiting)    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   Complex valvuloplasty including artificial Gore-tex neochord placement x6 with 28 mm Sorin Memo 4D ring annuloplasty via right mini thoracotomy approach   Solitary kidney    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    Family History  Problem Relation Age of Onset   Heart disease Father    Hernia Father    Healthy Sister    Stroke Paternal Grandmother    Cancer Mother        THYROID   Hypotension Mother    Heart attack Maternal Grandfather    Congestive Heart Failure Maternal Grandfather    Heart attack Paternal Grandfather    Colon cancer Maternal Aunt 79   Congestive Heart Failure Paternal Uncle    Congestive Heart Failure Maternal Grandmother    Colon cancer Other 29       MGMs brother   Colon cancer Other        MGFs  brother    Past Surgical History:  Procedure Laterality Date   ABDOMINAL AORTOGRAM N/A 02/10/2020   Procedure: ABDOMINAL AORTOGRAM;  Surgeon: Jettie Booze, MD;  Location: [facility];  Service: Cardiovascular;  Laterality: N/A;   APPLICATION OF WOUND VAC  08/15/2021   Procedure: APPLICATION OF WOUND VAC;  Surgeon: Rolm Bookbinder, MD;  Location: [facility];  Service: General;;   BIOPSY  01/07/2019   Procedure: BIOPSY;  Surgeon: Wilford Corner, MD;  Location: [facility];  Service: Endoscopy;;   BIOPSY  01/03/2020   Procedure: BIOPSY;  Surgeon: Wilford Corner, MD;  [facility]: [facility];  Service: Endoscopy;;   COLON SURGERY  03/04/2019   left colon segmental resection   COLONOSCOPY WITH PROPOFOL N/A 01/07/2019   Procedure: COLONOSCOPY WITH PROPOFOL;  Surgeon: Wilford Corner, MD;  [facility];  Service: Endoscopy;  Laterality: N/A;   COLONOSCOPY WITH PROPOFOL N/A 01/03/2020   Procedure: COLONOSCOPY WITH PROPOFOL;  Surgeon: Wilford Corner, MD;  [facility];  Service: Endoscopy;  Laterality: N/A;   INCISION AND DRAINAGE OF WOUND Right 08/13/2021   Procedure: IRRIGATION AND DEBRIDEMENT WOUND RIGHT GROIN;  Surgeon: Ralene Ok, MD;  Location: [facility];  Service: General;  Laterality: Right;   INGUINAL HERNIA REPAIR Left 04/23/2016   Procedure: LAPAROSCOPIC  INGUINAL REPAIR;  Surgeon: Clovis Riley, MD;  Location: [facility];  Service: General;  Laterality: Left;   IRRIGATION AND DEBRIDEMENT ABSCESS Right 08/15/2021   Procedure: IRRIGATION AND DEBRIDEMENT OF GROIN;  Surgeon: Rolm Bookbinder, MD;  Location: [facility];  Service: General;  Laterality: Right;   KNEE SURGERY     arthroscopic left knee   LIPOMA EXCISION  2015   x20 [facility]   MITRAL VALVE REPAIR Right 05/09/2020   Procedure: MINIMALLY INVASIVE MITRAL VALVE REPAIR (MVR) USING LIVANOVA MEMO 4D 28MM MITRAL RING;  Surgeon: [name], MD;  Location: [facility];  Service: Open Heart Surgery;   Laterality: Right;   POLYPECTOMY  01/03/2020   Procedure: POLYPECTOMY;  Surgeon: Wilford Corner, MD;  [facility];  Service: Endoscopy;;   PORT-A-CATH REMOVAL  PORTACATH PLACEMENT N/A 03/16/2019   Procedure: INSERTION PORT-A-CATH;  Surgeon: Michael Boston, MD;  [facility];  Service: General;  Laterality: N/A;   RIGHT/LEFT HEART CATH AND CORONARY ANGIOGRAPHY N/A 02/10/2020   Procedure: RIGHT/LEFT HEART CATH AND CORONARY ANGIOGRAPHY;  Surgeon: Jettie Booze, MD;  Location: [facility];  Service: Cardiovascular;  Laterality: N/A;   SUBMUCOSAL INJECTION  01/07/2019   Procedure: SUBMUCOSAL INJECTION;  Surgeon: Wilford Corner, MD;  [facility];  Service: Endoscopy;;   TEE WITHOUT CARDIOVERSION N/A 02/10/2020   Procedure: TRANSESOPHAGEAL ECHOCARDIOGRAM (TEE);  Surgeon: Acie Fredrickson Wonda Cheng, MD;  Location: [facility];  Service: Cardiovascular;  Laterality: N/A;  CATH AFTER TEE   TEE WITHOUT CARDIOVERSION N/A 05/09/2020   Procedure: TRANSESOPHAGEAL ECHOCARDIOGRAM (TEE);  Surgeon: [name], MD;  Location: [facility];  Service: Open Heart Surgery;  Laterality: N/A;   THYROIDECTOMY, PARTIAL  01/05/2009   Right Thyroid Lobectomy   TONSILLECTOMY     TOTAL NEPHRECTOMY Left 1970   Wilms Tumor left kidney excised as an infant   Social History   Occupational History   Occupation: executive  Tobacco Use   Smoking status: Never   Smokeless tobacco: Never  Vaping Use   Vaping Use: Never used  Substance and Sexual Activity   Alcohol use: Yes    Alcohol/week: 1.0 standard drink of alcohol    Types: 1 Glasses of wine per week    Comment: occasional   Drug use: No   Sexual activity: Yes

## 2022-01-22 DIAGNOSIS — Z85038 Personal history of other malignant neoplasm of large intestine: Secondary | ICD-10-CM | POA: Diagnosis not present

## 2022-01-22 DIAGNOSIS — I82502 Chronic embolism and thrombosis of unspecified deep veins of left lower extremity: Secondary | ICD-10-CM | POA: Diagnosis not present

## 2022-01-24 ENCOUNTER — Ambulatory Visit (HOSPITAL_BASED_OUTPATIENT_CLINIC_OR_DEPARTMENT_OTHER)
Admission: RE | Admit: 2022-01-24 | Discharge: 2022-01-24 | Disposition: A | Discharge status: Home / Self Care | Payer: BC Managed Care – PPO | Source: Ambulatory Visit | Attending: Oncology | Admitting: Oncology

## 2022-01-24 ENCOUNTER — Inpatient Hospital Stay: Payer: BC Managed Care – PPO | Attending: Oncology

## 2022-01-24 DIAGNOSIS — C186 Malignant neoplasm of descending colon: Secondary | ICD-10-CM | POA: Insufficient documentation

## 2022-01-24 DIAGNOSIS — N3289 Other specified disorders of bladder: Secondary | ICD-10-CM | POA: Diagnosis not present

## 2022-01-24 DIAGNOSIS — R918 Other nonspecific abnormal finding of lung field: Secondary | ICD-10-CM | POA: Diagnosis not present

## 2022-01-24 LAB — BASIC METABOLIC PANEL - CANCER CENTER ONLY
Anion gap: 9 (ref 5–15)
BUN: 20 mg/dL (ref 6–20)
CO2: 28 mmol/L (ref 22–32)
Calcium: 9.4 mg/dL (ref 8.9–10.3)
Chloride: 100 mmol/L (ref 98–111)
Creatinine: 1.11 mg/dL (ref 0.61–1.24)
GFR, Estimated: >60 mL/min (ref >60)
Glucose, Bld: 128 mg/dL — ABNORMAL HIGH (ref 70–99)
Potassium: 4.6 mmol/L (ref 3.5–5.1)
Sodium: 137 mmol/L (ref 135–145)

## 2022-01-24 LAB — CEA (ACCESS): CEA (CHCC): 1.74 ng/mL (ref 0.00–5.00)

## 2022-01-24 MED ORDER — IOHEXOL 300 MG/ML  SOLN
100.0000 mL | Freq: Once | INTRAMUSCULAR | Status: AC | PRN
  Administered 2022-01-24: 100 mL via INTRAVENOUS

## 2022-01-27 ENCOUNTER — Inpatient Hospital Stay: Payer: BC Managed Care – PPO | Admitting: Oncology

## 2022-02-03 DIAGNOSIS — E1169 Type 2 diabetes mellitus with other specified complication: Secondary | ICD-10-CM | POA: Diagnosis not present

## 2022-02-03 DIAGNOSIS — E7800 Pure hypercholesterolemia, unspecified: Secondary | ICD-10-CM | POA: Diagnosis not present

## 2022-02-03 DIAGNOSIS — I82409 Acute embolism and thrombosis of unspecified deep veins of unspecified lower extremity: Secondary | ICD-10-CM | POA: Diagnosis not present

## 2022-02-03 DIAGNOSIS — I10 Essential (primary) hypertension: Secondary | ICD-10-CM | POA: Diagnosis not present

## 2022-02-04 ENCOUNTER — Ambulatory Visit
Admission: RE | Admit: 2022-02-04 | Discharge: 2022-02-04 | Disposition: A | Discharge status: Home / Self Care | Payer: BC Managed Care – PPO | Source: Ambulatory Visit | Attending: Orthopedic Surgery | Admitting: Orthopedic Surgery

## 2022-02-04 DIAGNOSIS — M79602 Pain in left arm: Secondary | ICD-10-CM

## 2022-02-04 DIAGNOSIS — M5412 Radiculopathy, cervical region: Secondary | ICD-10-CM | POA: Diagnosis not present

## 2022-02-04 DIAGNOSIS — M4802 Spinal stenosis, cervical region: Secondary | ICD-10-CM | POA: Diagnosis not present

## 2022-02-04 DIAGNOSIS — M542 Cervicalgia: Secondary | ICD-10-CM | POA: Diagnosis not present

## 2022-02-04 MED ORDER — GADOPICLENOL 0.5 MMOL/ML IV SOLN: 7.5000 mL | Freq: Once | INTRAVENOUS | Status: DC | PRN

## 2022-02-06 ENCOUNTER — Ambulatory Visit: Payer: BC Managed Care – PPO | Admitting: Orthopedic Surgery

## 2022-02-11 ENCOUNTER — Inpatient Hospital Stay: Payer: BC Managed Care – PPO | Attending: Oncology | Admitting: Oncology

## 2022-02-11 VITALS — BP 129/84 | HR 90 | Temp 98.1°F | Resp 18 | Ht 72.0 in | Wt 220.0 lb

## 2022-02-11 DIAGNOSIS — Z86718 Personal history of other venous thrombosis and embolism: Secondary | ICD-10-CM | POA: Insufficient documentation

## 2022-02-11 DIAGNOSIS — Z85038 Personal history of other malignant neoplasm of large intestine: Secondary | ICD-10-CM | POA: Insufficient documentation

## 2022-02-11 DIAGNOSIS — Z8546 Personal history of malignant neoplasm of prostate: Secondary | ICD-10-CM | POA: Insufficient documentation

## 2022-02-11 DIAGNOSIS — Z7901 Long term (current) use of anticoagulants: Secondary | ICD-10-CM | POA: Diagnosis not present

## 2022-02-11 DIAGNOSIS — C186 Malignant neoplasm of descending colon: Secondary | ICD-10-CM

## 2022-02-11 NOTE — Progress Notes (Signed)
[facility] OFFICE PROGRESS NOTE   Diagnosis: Colon cancer  INTERVAL HISTORY:   [name] returns as scheduled.  He feels well at present.  He went surgery for a necrotizing right groin soft tissue infection in May.  He is followed closely by Dr. Olen Pel for management of diabetes.  He felt the left leg swelling in September and was diagnosed with a left lower extremity DVT on 12/13/2021.  He continues anticoagulation therapy.  The swelling has improved.  He continues observation for the prostate cancer. No difficulty with bowel function.  No bleeding.  Good appetite.  He has mild exertional dyspnea. A surveillance colonoscopy has been placed on hold due to the recent DVT.  He recently the left lower neck/shoulder discomfort.  He saw orthopedics and MRI of the cervical spine 02/04/2022 revealed multilevel degenerative endplate changes.  A 1.7 cm ovoid T1 hypointense lesion was noted in the T1 vertebral body without a corresponding abnormality on the October chest CT.  The pain has improved. Objective:  Vital signs in last 24 hours:  Blood pressure 129/84, pulse 90, temperature 98.1 F (36.7 C), temperature source Oral, resp. rate 18, height 6' (1.829 m), weight 220 lb (99.8 kg), SpO2 99 %.    Lymphatics: No cervical, supraclavicular, axillary, or inguinal nodes Resp: Lungs clear bilaterally  Cardio: Regular rate and rhythm GI: No hepatosplenomegaly Vascular: The left lower leg is larger than the right side, no edema  Skin: Multiple lipomas   Lab Results:  Lab Results  Component Value Date   WBC 5.7 12/29/2021   HGB 15.4 12/29/2021   HCT 45.9 12/29/2021   MCV 94.1 12/29/2021   PLT 153 12/29/2021   NEUTROABS 3.7 12/29/2021    CMP  Lab Results  Component Value Date   NA 137 01/24/2022   K 4.6 01/24/2022   CL 100 01/24/2022   CO2 28 01/24/2022   GLUCOSE 128 (H) 01/24/2022   BUN 20 01/24/2022   CREATININE 1.11 01/24/2022   CALCIUM 9.4 01/24/2022    PROT 7.1 12/29/2021   ALBUMIN 3.7 12/29/2021   AST 27 12/29/2021   ALT 29 12/29/2021   ALKPHOS 43 12/29/2021   BILITOT 1.1 12/29/2021   GFRNONAA >60 01/24/2022   GFRAA 115 02/08/2020    Lab Results  Component Value Date   CEA1 2.00 07/13/2020   CEA 1.74 01/24/2022     Medications: I have reviewed the patient's current medications.   Assessment/Plan: Adenocarcinoma of the descending colon, stage IIIC ([id number]), moderately differentiated adenocarcinoma with mucinous and signet cell features, status post a left colectomy 03/04/2019 Mass felt to be in the transverse colon on a colonoscopy 01/07/2019 with a biopsy confirming poorly differentiated adenocarcinoma with signet ring cell features, mismatch repair protein expression normal CT abdomen/pelvis 01/08/2019 12.3 cm indeterminate splenic mass, present in 2015 on a lumbar MRI-felt to be benign, small subpleural and pleural-based nodules at the lung bases, status post left nephrectomy CT chest 01/20/2019-small bilateral pulmonary nodules with rounded parenchymal nodules in the right lower lobe, indeterminate splenic mass CT chest 03/18/2019-multiple subcentimeter pulmonary nodules again seen bilaterally, greatest in the lower lobes, without significant change.  No new or enlarging pulmonary nodules or masses.  Partially visualized large heterogeneous splenic mass with no significant change.  Plan for follow-up chest CT at a 61-monthinterval. Cycle 1 FOLFOX 03/21/2019 Cycle 2 FOLFOX 04/04/2019 Cycle 3 FOLFOX 04/18/2019 (oxaliplatin dose reduced secondary to thrombocytopenia) Cycle 4 FOLFOX 05/02/2019 (oxaliplatin held secondary to thrombocytopenia) Cycle 5 FOLFOX 05/16/2019  Cycle 6 FOLFOX 05/30/2019 (oxaliplatin held secondary to thrombocytopenia) CT chest 06/08/2019-stable scattered small solid pulmonary nodules  Cycle 7 FOLFOX 06/13/2019 Cycle 8 FOLFOX 06/27/2019 Cycle 9 FOLFOX 07/11/2019 Cycle 10 FOLFOX 07/25/2019 (oxaliplatin held due to  thrombocytopenia) Cycle 11 FOLFOX 08/08/2019 Cycle 12 FOLFOX 08/22/2019 CTs 01/09/2020-no evidence of recurrent disease, stable lung nodules favored to represent subpleural lymph nodes-dedicated follow-up not recommended CTs 01/11/2021-no evidence of recurrent disease CTs 01/24/2022-no evidence of recurrent disease   Multiple colon polyps-ascending colon polyps on the colonoscopy 01/07/2019-not removed Colonoscopy 01/03/2020-multiple polyps removed, tubular adenomas, inflammatory and hyperplastic polyps Wilms tumor at age 46, status post chemotherapy/radiation and a nephrectomy in [place] Hurthle cell adenomas, 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  Left nephrectomy at age 29 12.  Port-A-Cath placement, Dr. Johney Maine, 03/16/2019 13.  Thrombocytopenia secondary to chemotherapy-oxaliplatin dose reduced beginning with cycle 3 FOLFOX, improved 14.  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  Right groin soft tissue infection May 2023-status postsurgical debridement 18.  Left leg DVT-common femoral, femoral, popliteal, posterior tibial, and peroneal veins 19.  02/04/2022-MRI cervical spine-1.7 cm T1 lesion-indeterminate     Disposition: [name] has a history of a 3 colon cancer.  Is now 3 years out from diagnosis.  He is in clinical remission.  The restaging CTs reveal no evidence of disease progression. He has a history of prostate cancer, treated with observation and followed by urology.  He was recently diagnosed with a left lower extremity DVT and is maintained on anticoagulation therapy.  The DVT occurred approximately 4 months after undergoing  surgery for an infection in the right groin.  I recommend he continue anticoagulation therapy.  I will present his case at the GI tumor conference to review the T1 lesion noted on MRI.  We will schedule additional imaging as recommended.  My initial impression is to recommend long-term anticoagulation given the extent of the DVT and the prostate cancer.  He will return for an office visit in 3 months.  Betsy Coder, MD  02/11/2022  8:05 AM

## 2022-02-13 ENCOUNTER — Encounter: Payer: Self-pay | Admitting: *Deleted

## 2022-02-14 ENCOUNTER — Other Ambulatory Visit: Payer: Self-pay | Admitting: *Deleted

## 2022-02-19 ENCOUNTER — Other Ambulatory Visit: Payer: Self-pay | Admitting: *Deleted

## 2022-02-19 ENCOUNTER — Encounter: Payer: Self-pay | Admitting: *Deleted

## 2022-02-19 ENCOUNTER — Other Ambulatory Visit: Payer: Self-pay

## 2022-02-19 DIAGNOSIS — C186 Malignant neoplasm of descending colon: Secondary | ICD-10-CM

## 2022-02-19 NOTE — Progress Notes (Signed)
The proposed treatment discussed in conference is for discussion purpose only and is not a binding recommendation.  The patients have not been physically examined, or presented with their treatment options.  Therefore, final treatment plans cannot be decided.  

## 2022-02-24 ENCOUNTER — Encounter: Payer: Self-pay | Admitting: Orthopedic Surgery

## 2022-02-24 ENCOUNTER — Ambulatory Visit (INDEPENDENT_AMBULATORY_CARE_PROVIDER_SITE_OTHER): Payer: BC Managed Care – PPO | Admitting: Orthopedic Surgery

## 2022-02-24 DIAGNOSIS — M79602 Pain in left arm: Secondary | ICD-10-CM

## 2022-03-02 ENCOUNTER — Encounter: Payer: Self-pay | Admitting: Orthopedic Surgery

## 2022-03-02 NOTE — Progress Notes (Unsigned)
Office Visit Note   Patient: [name]           Date of Birth: 09/15/1965           MRN: [id number] Visit Date: 02/24/2022 Requested by: Orpah Melter, MD [address] [place],  [place] [address] PCP: Orpah Melter, MD  Subjective: Chief Complaint  Patient presents with   Neck - Follow-up    MRI review    HPI: David YBANEZ is a 56 y.o. male who presents to the office for MRI review.  Patient states that he is really having no left-sided arm symptoms anymore.  Has had some right shoulder lateral sided pain over the last day or 2.  Has recently been back to exercising 4 times per week which she is very happy about.  Takes Pradaxa for DVT prophylaxis.  MRI results revealed: MR Cervical Spine w/o contrast  Result Date: 02/05/2022 CLINICAL DATA:  Cervical radiculopathy. Neck pain radiating to the left arm for 2 months. History of colorectal cancer. EXAM: MRI CERVICAL SPINE WITHOUT CONTRAST TECHNIQUE: Multiplanar, multisequence MR imaging of the cervical spine was performed. No intravenous contrast was administered. COMPARISON:  Cervical spine radiographs 01/10/2022. Chest CT 01/24/2022. FINDINGS: Alignment: Reversal of the normal cervical lordosis. Trace retrolisthesis of C4 on C5. Vertebrae: No fracture. Multilevel chronic degenerative endplate changes. Mild degenerative endplate edema at E3-1. 1.7 cm ovoid region of T1 hypointensity and STIR hyperintensity inferiorly in the T1 vertebral body without significant T1-2 disc degeneration and without a corresponding abnormality on the recent chest CT. Cord: Normal signal and morphology. Posterior Fossa, vertebral arteries, paraspinal tissues: Unremarkable. Disc levels: C2-3: Uncovertebral spurring and mild facet arthrosis result in mild left neural foraminal stenosis without spinal stenosis. C3-4: Mild disc space narrowing. Left eccentric disc bulging, uncovertebral spurring, and mild facet arthrosis result in mild left neural  foraminal stenosis without spinal stenosis. C4-5: Moderate disc space narrowing. Disc bulging and uncovertebral spurring result in minimal to mild left greater than right neural foraminal stenosis without spinal stenosis. C5-6: Negative. C6-7: Mild disc space narrowing. Mild disc bulging and left uncovertebral spurring result in borderline left neural foraminal stenosis without spinal stenosis. C7-T1: Moderate disc space narrowing. Disc bulging and uncovertebral spurring result in borderline to mild bilateral neural foraminal stenosis without spinal stenosis. IMPRESSION: 1. Diffuse cervical disc degeneration with mild multilevel neural foraminal stenosis as above. No spinal stenosis. 2. 1.7 cm lesion in the T1 vertebral body, indeterminate however metastatic disease is a possibility given the history of colorectal cancer. Consider nuclear medicine whole-body bone scan for further evaluation. Electronically Signed   By: Logan Bores M.D.   On: 02/05/2022 16:11                 ROS: All systems reviewed are negative as they relate to the chief complaint within the history of present illness.  Patient denies fevers or chills.  Assessment & Plan: Visit Diagnoses:  1. Left arm pain     Plan: [name] is a 56 y.o. male who presents to the office for review of MRI C-spine.  MRI demonstrated diffuse degenerative disc disease with mild foraminal stenosis at multiple levels.  However, patient is currently asymptomatic in regards to the left arm complaints he had prior.  Does have a very slight amount of external rotation weakness that is very subtle compared to contralateral side.  Has more crepitus on the right shoulder however compared to left.  With patient's lack of  symptoms plan to hold off on any intervention for now.  Cannot really have cervical spine injections without going off of his blood thinner anyway.  48-monthreturn for decision for or against MRI of the symptomatic shoulder at that  point.  Follow-Up Instructions: No follow-ups on file.   Orders:  No orders of the defined types were placed in this encounter.  No orders of the defined types were placed in this encounter.     Procedures: No procedures performed   Clinical Data: No additional findings.  Objective: Vital Signs: There were no vitals taken for this visit.  Physical Exam:  Constitutional: Patient appears well-developed HEENT:  Head: Normocephalic Eyes:EOM are normal Neck: Normal range of motion Cardiovascular: Normal rate Pulmonary/chest: Effort normal Neurologic: Patient is alert Skin: Skin is warm Psychiatric: Patient has normal mood and affect  Ortho Exam: Ortho exam demonstrates left shoulder with 85 degrees X rotation, 110 degrees abduction, 170 degrees forward flexion.  Right shoulder with 85 degrees X rotation, 95 degrees abduction, 170 degrees forward flexion.  5/5 external rotation strength of the left shoulder relative to 5/5 on the right but otherwise no other weakness with the rotator cuff with 5/5 supra and subscap strength bilaterally.  There is a slight amount of anterior lateral crepitus noted in the right shoulder more so than the left.  5/5 EPL, FPL, finger abduction, finger abduction, pronation/supination, bicep, tricep, deltoid.  Axillary nerve is intact with deltoid firing of both shoulders.  Specialty Comments:  No specialty comments available.  Imaging: No results found.   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   Cancer of descending colon s/p robotic left  hemicolectomy 03/04/2019 01/26/2019   History of thyroid cancer 01/26/2019   Multiple lipomas 01/26/2019   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   Family history of colon cancer    Family history of thyroid cancer    Screen for colon cancer 01/07/2019   Type 2 diabetes mellitus ([facility] 01/11/2015   Mitral regurgitation 07/06/2014   Discitis of lumbar region 03/08/2014   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HCC)    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[facility] 03/07/2014   Solitary kidney, acquired 03/07/2014   Malnutrition of moderate degree ([facility] 03/01/2014   CVA (cerebral infarction)- embolic 123/76/2831  Severe sepsis ([facility]    Intractable back pain 02/27/2014   Hyperglycemia 02/27/2014   Hyperlipidemia 02/27/2014   Lipomatosis, 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   Family history of ischemic heart disease 04/05/2013   HYPERTENSION, BENIGN 03/12/2008   PALPITATIONS 03/12/2008   Past Medical History:  Diagnosis Date   Allergic rhinitis    Asthma    animal dander & seasonal asthma   Colon cancer ([facility] 01/26/2019   Diabetes mellitus without complication ([facility]    dx 2010  type 2   Discitis of lumbosacral region    first started with this ..and rolled onto the endocarditis    stayed a month   Endocarditis of mitral valve    11/2013 from back strep infection   Family history of colon cancer    Family history of thyroid cancer    H/O scoliosis  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   History of hiatal hernia    Hyperlipidemia    Hypertension    Diagnostic exercise tolerance test assessment:04/30/2010 : comments normal -no evidence os ischemia by ST analysis   lipoma    Lipoma 01/26/2019   Mitral regurgitation    Echo 12/2018: EF 60-65, myxomatous mitral valve, severe mitral regurgitation, partial flail leaflet of posterior mitral valve, myxomatous tricuspid valve with trivial TR, ascending aorta mildly dilated (39 mm)   Pneumonia    as child   PONV (postoperative nausea and vomiting)    S/P minimally  invasive mitral valve repair 05/09/2020   Complex valvuloplasty including artificial Gore-tex neochord placement x6 with 28 mm Sorin Memo 4D ring annuloplasty via right mini thoracotomy approach   Solitary kidney    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    Family History  Problem Relation Age of Onset   Heart disease Father    Hernia Father    Healthy Sister    Stroke Paternal Grandmother    Cancer Mother        THYROID   Hypotension Mother    Heart attack Maternal Grandfather    Congestive Heart Failure Maternal Grandfather    Heart attack Paternal Grandfather    Colon cancer Maternal Aunt 79   Congestive Heart Failure Paternal Uncle    Congestive Heart Failure Maternal Grandmother    Colon cancer Other 65       MGMs brother   Colon cancer Other        MGFs brother    Past Surgical History:  Procedure Laterality Date   ABDOMINAL AORTOGRAM N/A 02/10/2020   Procedure: ABDOMINAL AORTOGRAM;  Surgeon: Jettie Booze, MD;  Location: [facility];  Service: Cardiovascular;  Laterality: N/A;   APPLICATION OF WOUND VAC  08/15/2021   Procedure: APPLICATION OF WOUND VAC;  Surgeon: Rolm Bookbinder, MD;  Location: [facility];  Service: General;;   BIOPSY  01/07/2019   Procedure: BIOPSY;  Surgeon: Wilford Corner, MD;  [facility];  Service: Endoscopy;;   BIOPSY  01/03/2020   Procedure: BIOPSY;  Surgeon: Wilford Corner, MD;  [facility];  Service: Endoscopy;;   COLON SURGERY  03/04/2019   left colon segmental resection   COLONOSCOPY WITH PROPOFOL N/A 01/07/2019   Procedure: COLONOSCOPY WITH PROPOFOL;  Surgeon: Wilford Corner, MD;  [facility];  Service: Endoscopy;  Laterality: N/A;   COLONOSCOPY WITH PROPOFOL N/A 01/03/2020   Procedure: COLONOSCOPY WITH PROPOFOL;  Surgeon: Wilford Corner, MD;  [facility];  Service: Endoscopy;  Laterality: N/A;   INCISION AND DRAINAGE OF WOUND Right 08/13/2021    Procedure: IRRIGATION AND DEBRIDEMENT WOUND RIGHT GROIN;  Surgeon: Ralene Ok, MD;  Location: [facility];  Service: General;  Laterality: Right;   INGUINAL HERNIA REPAIR Left 04/23/2016   Procedure: LAPAROSCOPIC  INGUINAL REPAIR;  Surgeon: Clovis Riley, MD;  Location: [facility];  Service: General;  Laterality: Left;   IRRIGATION AND DEBRIDEMENT ABSCESS Right 08/15/2021   Procedure: IRRIGATION AND DEBRIDEMENT OF GROIN;  Surgeon: Rolm Bookbinder, MD;  Location: [facility];  Service: General;  Laterality: Right;   KNEE SURGERY     arthroscopic left knee   LIPOMA EXCISION  2015   x20 [facility]   MITRAL VALVE REPAIR Right 05/09/2020   Procedure: MINIMALLY INVASIVE MITRAL VALVE REPAIR (MVR) USING LIVANOVA MEMO 4D 28MM MITRAL RING;  Surgeon: [name], MD;  Location: [facility];  Service: Open Heart  Surgery;  Laterality: Right;   POLYPECTOMY  01/03/2020   Procedure: POLYPECTOMY;  Surgeon: Wilford Corner, MD;  [facility];  Service: Endoscopy;;   PORT-A-CATH REMOVAL     PORTACATH PLACEMENT N/A 03/16/2019   Procedure: INSERTION PORT-A-CATH;  Surgeon: Michael Boston, MD;  [facility];  Service: General;  Laterality: N/A;   RIGHT/LEFT HEART CATH AND CORONARY ANGIOGRAPHY N/A 02/10/2020   Procedure: RIGHT/LEFT HEART CATH AND CORONARY ANGIOGRAPHY;  Surgeon: Jettie Booze, MD;  Location: [facility];  Service: Cardiovascular;  Laterality: N/A;   SUBMUCOSAL INJECTION  01/07/2019   Procedure: SUBMUCOSAL INJECTION;  Surgeon: Wilford Corner, MD;  [facility];  Service: Endoscopy;;   TEE WITHOUT CARDIOVERSION N/A 02/10/2020   Procedure: TRANSESOPHAGEAL ECHOCARDIOGRAM (TEE);  Surgeon: Acie Fredrickson Wonda Cheng, MD;  Location: [facility];  Service: Cardiovascular;  Laterality: N/A;  CATH AFTER TEE   TEE WITHOUT CARDIOVERSION N/A 05/09/2020   Procedure: TRANSESOPHAGEAL ECHOCARDIOGRAM (TEE);  Surgeon: [name], MD;  Location: [facility];  Service: Open Heart Surgery;  Laterality: N/A;    THYROIDECTOMY, PARTIAL  01/05/2009   Right Thyroid Lobectomy   TONSILLECTOMY     TOTAL NEPHRECTOMY Left 1970   Wilms Tumor left kidney excised as an infant   Social History   Occupational History   Occupation: executive  Tobacco Use   Smoking status: Never   Smokeless tobacco: Never  Vaping Use   Vaping Use: Never used  Substance and Sexual Activity   Alcohol use: Yes    Alcohol/week: 1.0 standard drink of alcohol    Types: 1 Glasses of wine per week    Comment: occasional   Drug use: No   Sexual activity: Yes

## 2022-03-14 ENCOUNTER — Encounter (HOSPITAL_COMMUNITY): Admission: RE | Admit: 2022-03-14 | Payer: BC Managed Care – PPO | Source: Ambulatory Visit

## 2022-03-14 ENCOUNTER — Encounter (HOSPITAL_COMMUNITY)
Admission: RE | Admit: 2022-03-14 | Discharge: 2022-03-14 | Disposition: A | Discharge status: Home / Self Care | Payer: BC Managed Care – PPO | Source: Ambulatory Visit | Attending: Oncology | Admitting: Oncology

## 2022-03-14 ENCOUNTER — Encounter (HOSPITAL_COMMUNITY): Payer: BC Managed Care – PPO

## 2022-03-14 DIAGNOSIS — C186 Malignant neoplasm of descending colon: Secondary | ICD-10-CM | POA: Diagnosis not present

## 2022-03-14 DIAGNOSIS — Z85038 Personal history of other malignant neoplasm of large intestine: Secondary | ICD-10-CM | POA: Diagnosis not present

## 2022-03-14 MED ORDER — TECHNETIUM TC 99M MEDRONATE IV KIT
20.0000 | PACK | Freq: Once | INTRAVENOUS | Status: AC | PRN
  Administered 2022-03-14: 19 via INTRAVENOUS

## 2022-03-18 ENCOUNTER — Encounter: Payer: Self-pay | Admitting: *Deleted

## 2022-03-18 NOTE — Progress Notes (Signed)
Dr. Benay Spice noted CT results of 03/16/22. Waiting on Dr. Tresa Moore to decide on treatment here or with urology. Dr. Benay Spice spoke with Mr. Cimo

## 2022-03-20 ENCOUNTER — Telehealth: Payer: Self-pay | Admitting: *Deleted

## 2022-03-20 NOTE — Telephone Encounter (Signed)
Dr. Benay Spice spoke w/Dr. Tresa Moore re: CT scan, who feels he does not have prostate cancer. Would be very unusual for colon cancer to have bone metastasis. Could be an inflammatory issue. Let's see him next week to review scan and next steps. Mr. Herberger notified and agrees to plan. Was asking to be seen tomorrow, but made aware MD is not able to see him tomorrow. He agrees to 12/26 at 10:00

## 2022-03-21 ENCOUNTER — Inpatient Hospital Stay: Payer: BC Managed Care – PPO | Attending: Oncology | Admitting: Nurse Practitioner

## 2022-03-21 ENCOUNTER — Encounter: Payer: Self-pay | Admitting: Nurse Practitioner

## 2022-03-21 VITALS — BP 144/96 | HR 71 | Temp 98.1°F | Resp 18 | Ht 72.0 in | Wt 222.0 lb

## 2022-03-21 DIAGNOSIS — C186 Malignant neoplasm of descending colon: Secondary | ICD-10-CM

## 2022-03-21 DIAGNOSIS — Z85038 Personal history of other malignant neoplasm of large intestine: Secondary | ICD-10-CM | POA: Insufficient documentation

## 2022-03-21 DIAGNOSIS — Z8546 Personal history of malignant neoplasm of prostate: Secondary | ICD-10-CM | POA: Insufficient documentation

## 2022-03-21 NOTE — Progress Notes (Signed)
[facility] OFFICE PROGRESS NOTE   Diagnosis: Colon cancer  INTERVAL HISTORY:   [name] returns prior to scheduled follow-up to review recent imaging.  Aside from mild fatigue he feels well.  No fevers or sweats.  No weight loss.  He has a good appetite.  Stable chronic back pain.  No new areas of pain.  Bowels moving regularly. Objective:  Vital signs in last 24 hours:  Blood pressure (!) 144/96, pulse 71, temperature 98.1 F (36.7 C), temperature source Oral, resp. rate 18, height 6' (1.829 m), weight 222 lb (100.7 kg), SpO2 100 %.    HEENT: No thrush or ulcers. Lymphatics: No palpable cervical, supraclavicular, axillary or inguinal lymph nodes. Resp: Lungs clear bilaterally. Cardio: Regular rate and rhythm. GI: No hepatosplenomegaly. Vascular: Left lower leg is larger than the right lower leg.  No edema. Neuro: Alert and oriented. Skin: Multiple lipomas.   Lab Results:  Lab Results  Component Value Date   WBC 5.7 12/29/2021   HGB 15.4 12/29/2021   HCT 45.9 12/29/2021   MCV 94.1 12/29/2021   PLT 153 12/29/2021   NEUTROABS 3.7 12/29/2021    Imaging:  No results found.  Medications: I have reviewed the patient's current medications.  Assessment/Plan:  Adenocarcinoma of the descending colon, stage IIIC (X7D5H), moderately differentiated adenocarcinoma with mucinous and signet cell features, status post a left colectomy 03/04/2019 Mass felt to be in the transverse colon on a colonoscopy 01/07/2019 with a biopsy confirming poorly differentiated adenocarcinoma with signet ring cell features, mismatch repair protein expression normal CT abdomen/pelvis 01/08/2019 12.3 cm indeterminate splenic mass, present in 2015 on a lumbar MRI-felt to be benign, small subpleural and pleural-based nodules at the lung bases, status post left nephrectomy CT chest 01/20/2019-small bilateral pulmonary nodules with rounded parenchymal nodules in the right lower lobe,  indeterminate splenic mass CT chest 03/18/2019-multiple subcentimeter pulmonary nodules again seen bilaterally, greatest in the lower lobes, without significant change.  No new or enlarging pulmonary nodules or masses.  Partially visualized large heterogeneous splenic mass with no significant change.  Plan for follow-up chest CT at a 62-monthinterval. Cycle 1 FOLFOX 03/21/2019 Cycle 2 FOLFOX 04/04/2019 Cycle 3 FOLFOX 04/18/2019 (oxaliplatin dose reduced secondary to thrombocytopenia) Cycle 4 FOLFOX 05/02/2019 (oxaliplatin held secondary to thrombocytopenia) Cycle 5 FOLFOX 05/16/2019 Cycle 6 FOLFOX 05/30/2019 (oxaliplatin held secondary to thrombocytopenia) CT chest 06/08/2019-stable scattered small solid pulmonary nodules  Cycle 7 FOLFOX 06/13/2019 Cycle 8 FOLFOX 06/27/2019 Cycle 9 FOLFOX 07/11/2019 Cycle 10 FOLFOX 07/25/2019 (oxaliplatin held due to thrombocytopenia) Cycle 11 FOLFOX 08/08/2019 Cycle 12 FOLFOX 08/22/2019 CTs 01/09/2020-no evidence of recurrent disease, stable lung nodules favored to represent subpleural lymph nodes-dedicated follow-up not recommended CTs 01/11/2021-no evidence of recurrent disease CTs 01/24/2022-no evidence of recurrent disease   Multiple colon polyps-ascending colon polyps on the colonoscopy 01/07/2019-not removed Colonoscopy 01/03/2020-multiple polyps removed, tubular adenomas, inflammatory and hyperplastic polyps 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  Left nephrectomy at age [age]  Port-A-Cath placement, Dr. GJohney Maine 03/16/2019 13.  Thrombocytopenia secondary to chemotherapy-oxaliplatin dose reduced beginning with cycle 3 FOLFOX, improved 14.  Oxaliplatin neuropathy, mild loss of vibratory  sense on exam 06/27/2019, 08/08/2019 15.  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  Prostate cancer 04/04/2021-Gleason 6 adenocarcinoma involving 10% of 1 core  biopsy 17.  Right groin soft tissue infection May 2023-status postsurgical debridement 18.  Left leg DVT-common femoral, femoral, popliteal, posterior tibial, and peroneal veins 19.  02/04/2022-MRI cervical spine-1.7 cm T1 lesion-indeterminate; bone scan 03/14/2022-focal intense activity at approximate T1 level felt to correspond to the lesion on MRI, additional foci of activity involving the lower thoracic and lumbar spine, right iliac bone and pubic symphysis.   Disposition: [name] appears stable.  We reviewed the recent bone scan, report and images, with [name] and his wife.  They understand there is increased activity involving several areas and the concern is these areas could represent metastatic disease.  He had CT scans 01/24/2022 which were negative for recurrent/metastatic disease.  He has no symptoms to suggest malignancy.  His case will be presented at the upcoming tumor conference.  He will return for a follow-up appointment on 04/03/2021.  He will contact the office in the interim with any problems.  Patient seen with Dr. Benay Spice.    Ned Card ANP/GNP-BC   03/21/2022  9:03 AM This was a shared visit with Ned Card.  [name] was interviewed and examined.  We reviewed the bone scan findings and images with him.  I discussed the case with Dr. Tresa Moore.  Dr.Manny feels it is very unlikely the bone lesions represent metastatic prostate cancer.  He has a history of low-grade prostate cancer with a recent stable PSA.  We discussed the differential diagnosis with [name].  This includes metastatic colon cancer, though recent CTs were negative for recurrent disease and the CEA was normal.  The differential diagnosis includes a benign process and other malignancies.  I will present his case at the GI tumor conference  within the next few weeks.  We will decide on obtaining a PET scan or biopsy after the tumor conference discussion.  I was present for greater than 50% today's visit.  I performed medical decision making.  Julieanne Manson, MD

## 2022-03-25 ENCOUNTER — Ambulatory Visit: Payer: BC Managed Care – PPO | Admitting: Oncology

## 2022-03-26 ENCOUNTER — Other Ambulatory Visit: Payer: Self-pay

## 2022-03-26 NOTE — Progress Notes (Signed)
The proposed treatment discussed in conference is for discussion purpose only and is not a binding recommendation.  The patients have not been physically examined, or presented with their treatment options.  Therefore, final treatment plans cannot be decided.  

## 2022-04-01 ENCOUNTER — Telehealth: Payer: Self-pay | Admitting: Nurse Practitioner

## 2022-04-01 ENCOUNTER — Other Ambulatory Visit: Payer: Self-pay | Admitting: Nurse Practitioner

## 2022-04-01 DIAGNOSIS — C61 Malignant neoplasm of prostate: Secondary | ICD-10-CM

## 2022-04-01 DIAGNOSIS — C186 Malignant neoplasm of descending colon: Secondary | ICD-10-CM

## 2022-04-01 NOTE — Telephone Encounter (Signed)
I spoke with [name] to discuss the recommendation from the recent GI tumor conference.  Biopsy of a bone lesion is recommended.  He is in agreement.  Order has been entered.

## 2022-04-02 NOTE — Progress Notes (Signed)
Michaelle Birks, MD  [name] D; P [facility] Procedure Requests BIOPSY REVIEW Date: 04/02/22  Requested Biopsy site: Bone Reason for request: Lesions  Imaging review: I reviewed all pertinent diagnostic studies, including; NM WB, 03/14/22 and CT CAP, 01/24/22  Recommended imaging modality to perform biopsy: NONE. *Decline / Defer*  Additional comments: There is no anatomic correlate for the lesions seen on NM Bone Scan, per CT from 01/24/22 I would recommend a CT AP or better yet a PET/CT if preferred to re-eval, then resubmit BX request.  Please contact me with questions, concerns, or if issue pertaining to this request arise.  Michaelle Birks, MD [facility] Radiology  Pager. [phone] Clinic. [phone]       Previous Messages

## 2022-04-03 ENCOUNTER — Inpatient Hospital Stay: Payer: BC Managed Care – PPO | Admitting: Nurse Practitioner

## 2022-04-03 NOTE — Progress Notes (Signed)
Cardiology Office Note   Date:  04/04/2022   ID:  David, [name] December 10, 1965, MRN [id number]  PCP:  Orpah Melter, MD    No chief complaint on file.  S/p mitral repair  Wt Readings from Last 3 Encounters:  04/04/22 220 lb 9.6 oz (100.1 kg)  03/21/22 222 lb (100.7 kg)  02/11/22 220 lb (99.8 kg)       History of Present Illness: [name] is a 57 y.o. male  with a h/o Wilms tumor status post nephrectomy, hypertension, lipoma, mitral bacterial valve endocarditis 7209 which was complicated by mitral valve regurgitation, had cerebral emboli from endocarditis which was followed by a 6-week course of antibiotics to treat his Enterococcus bloodstream infection.  PMH also includes HTN, hyperlipidemia stage IIIc adenocarcinoma of the colon status post left colectomy 12/20 status post chemotherapy, and type 2 diabetes.   TEE 02/10/2020 showed severe mitral valve regurgitation with flail segment involving portions of the posterior leaflet.  EF 60-65%.  Diagnostic cardiac cath showed normal coronary arteries with no significant coronary artery disease.  PA pressures were normal.  He presented for surgery and was taken to the operating room 05/09/2020.  He received minimally invasive mitral valve repair.  He was atrially paced.  Anticoagulation with Coumadin was initiated postop day 1.  He was noted to have a rhythm change that was atrial fibrillation with RVR versus accelerated junctional 05/12/20.  He was placed on Lopressor and amiodarone.  He was discharged in stable condition on 05/14/2020.  He tracks his HR by his Apple watch.   Converted from Coumadin to Xarelto in 3/22.  Also converted to NSR around that time, prior to cardioversion.   Echo in 3/22 showed: "Left ventricular ejection fraction, by estimation, is 55 to 60%. The  left ventricle has normal function. The left ventricle has no regional  wall motion abnormalities. Left ventricular diastolic parameters are  consistent  with Grade II diastolic  dysfunction (pseudonormalization). Elevated left atrial pressure.   2. Right ventricular systolic function is low normal. The right  ventricular size is mildly enlarged. There is normal pulmonary artery  systolic pressure.   3. Left atrial size was moderately dilated.   4. Right atrial size was mildly dilated.   5. S/p MV valvuloplasty with chord placement and 28 mm Sorin Memo 4D  annuloplasty ring (05/09/20). Peak and men gradients through the valve are  11 and 6 mm Hg respectively. MVA (VTI) is 1.7 cm2. Trivial mitral valve  regurgitation.   6. The aortic valve is tricuspid. Aortic valve regurgitation is not  visualized. Mild aortic valve sclerosis is present, with no evidence of  aortic valve stenosis.   7. Aortic dilatation noted. There is mild dilatation of the ascending  aorta, measuring 41 mm.   8. The inferior vena cava is normal in size with greater than 50%  respiratory variability, suggesting right atrial pressure of 3 mmHg. "  In 2023, Had a groin abscess. Needed wound vac. Now healed.   No AFib on Apple Watch.    DVT in late 2023.  Pradaxa was started. He has gained weight.  BP has been high despite lisinopril 5 mg daily.   Denies : Chest pain. Dizziness. Leg edema. Nitroglycerin use. Orthopnea. Palpitations. Paroxysmal nocturnal dyspnea.  Syncope.    Low grade PrCA.   Feels more DOE than usual.  Gets a second wind on the elliptical and can continue workout.   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   History of hiatal hernia    Hyperlipidemia    Hypertension    Diagnostic exercise tolerance test assessment:04/30/2010 : comments normal -no evidence os ischemia by ST analysis   lipoma    Lipoma 01/26/2019   Mitral regurgitation    Echo 12/2018: EF 60-65, myxomatous mitral valve, severe mitral regurgitation, partial flail leaflet of posterior mitral valve, myxomatous tricuspid valve with trivial TR, ascending aorta mildly dilated (39 mm)   Pneumonia    as child   PONV (postoperative nausea and vomiting)    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   Complex valvuloplasty including artificial Gore-tex neochord placement x6 with 28 mm Sorin Memo 4D ring annuloplasty via right mini thoracotomy approach   Solitary kidney    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    Past Surgical History:  Procedure Laterality Date   ABDOMINAL AORTOGRAM N/A 02/10/2020   Procedure: ABDOMINAL AORTOGRAM;  Surgeon: Jettie Booze, MD;  Location: [place] CV [facility];  Service: Cardiovascular;  Laterality: N/A;   APPLICATION OF WOUND VAC  08/15/2021   Procedure: APPLICATION OF WOUND VAC;  Surgeon: Rolm Bookbinder, MD;  Location: [place];  Service: General;;   BIOPSY  01/07/2019   Procedure: BIOPSY;  Surgeon: Wilford Corner, MD;  Location: [facility];  Service: Endoscopy;;   BIOPSY  01/03/2020   Procedure: BIOPSY;  Surgeon: Wilford Corner, MD;  [facility];  Service: Endoscopy;;   COLON SURGERY  03/04/2019   left colon segmental resection   COLONOSCOPY WITH PROPOFOL N/A 01/07/2019   Procedure: COLONOSCOPY WITH PROPOFOL;  Surgeon: Wilford Corner, MD;  [facility];  Service: Endoscopy;  Laterality: N/A;   COLONOSCOPY WITH PROPOFOL N/A 01/03/2020   Procedure: COLONOSCOPY WITH PROPOFOL;  Surgeon: Wilford Corner, MD;  [facility];  Service: Endoscopy;  Laterality: N/A;   INCISION AND DRAINAGE OF WOUND Right  08/13/2021   Procedure: IRRIGATION AND DEBRIDEMENT WOUND RIGHT GROIN;  Surgeon: Ralene Ok, MD;  Location: [place];  Service: General;  Laterality: Right;   INGUINAL HERNIA REPAIR Left 04/23/2016   Procedure: LAPAROSCOPIC  INGUINAL REPAIR;  Surgeon: Clovis Riley, MD;  Location: [facility];  Service: General;  Laterality: Left;   IRRIGATION AND DEBRIDEMENT ABSCESS Right 08/15/2021   Procedure: IRRIGATION AND DEBRIDEMENT OF GROIN;  Surgeon: Rolm Bookbinder, MD;  Location: [place];  Service: General;  Laterality: Right;   KNEE SURGERY     arthroscopic left knee   LIPOMA EXCISION  2015   x20 [facility]   MITRAL VALVE REPAIR Right 05/09/2020   Procedure: MINIMALLY INVASIVE MITRAL VALVE REPAIR (MVR) USING LIVANOVA MEMO 4D 28MM MITRAL RING;  Surgeon: [name], MD;  Location: [place];  Service: Open Heart Surgery;  Laterality: Right;   POLYPECTOMY  01/03/2020   Procedure: POLYPECTOMY;  Surgeon: Wilford Corner, MD;  [facility];  Service: Endoscopy;;  PORT-A-CATH REMOVAL     PORTACATH PLACEMENT N/A 03/16/2019   Procedure: INSERTION PORT-A-CATH;  Surgeon: Michael Boston, MD;  [facility];  Service: General;  Laterality: N/A;   RIGHT/LEFT HEART CATH AND CORONARY ANGIOGRAPHY N/A 02/10/2020   Procedure: RIGHT/LEFT HEART CATH AND CORONARY ANGIOGRAPHY;  Surgeon: Jettie Booze, MD;  Location: [place] [facility];  Service: Cardiovascular;  Laterality: N/A;   SUBMUCOSAL INJECTION  01/07/2019   Procedure: SUBMUCOSAL INJECTION;  Surgeon: Wilford Corner, MD;  [facility];  Service: Endoscopy;;   TEE WITHOUT CARDIOVERSION N/A 02/10/2020   Procedure: TRANSESOPHAGEAL ECHOCARDIOGRAM (TEE);  Surgeon: Acie Fredrickson Wonda Cheng, MD;  Location: [place];  Service: Cardiovascular;  Laterality: N/A;  CATH AFTER TEE   TEE WITHOUT CARDIOVERSION N/A 05/09/2020   Procedure: TRANSESOPHAGEAL ECHOCARDIOGRAM (TEE);  Surgeon: [name], MD;  Location: [place];  Service: Open Heart Surgery;   Laterality: N/A;   THYROIDECTOMY, PARTIAL  01/05/2009   Right Thyroid Lobectomy   TONSILLECTOMY     TOTAL NEPHRECTOMY Left 1970   Wilms Tumor left kidney excised as an infant     Current Outpatient Medications  Medication Sig Dispense Refill   acetaminophen (TYLENOL) 500 MG tablet Take 1 tablet (500 mg total) by mouth daily as needed for mild pain. 30 tablet 0   albuterol (PROVENTIL HFA;VENTOLIN HFA) 108 (90 BASE) MCG/ACT inhaler Inhale 1-2 puffs into the lungs every 6 (six) hours as needed for wheezing or shortness of breath.     aspirin EC 81 MG EC tablet Take 1 tablet (81 mg total) by mouth daily. Swallow whole. (Patient taking differently: Take 81 mg by mouth daily.) 30 tablet 11   atorvastatin (LIPITOR) 10 MG tablet Take 1 tablet (10 mg total) by mouth daily. 30 tablet 1   calcium carbonate (TUMS EX) 750 MG chewable tablet Chew 1,500 mg by mouth daily as needed for heartburn.     dabigatran (PRADAXA) 150 MG CAPS capsule Take 150 mg by mouth 2 (two) times daily.     fluticasone (FLONASE) 50 MCG/ACT nasal spray Place 1 spray into both nostrils daily as needed for allergies or rhinitis.     furosemide (LASIX) 20 MG tablet Take 1 tablet (20 mg total) by mouth as needed. 30 tablet 3   ibuprofen (ADVIL) 200 MG tablet Take 200 mg by mouth daily as needed for mild pain.     insulin glargine, 1 Unit Dial, (TOUJEO SOLOSTAR) 300 UNIT/ML Solostar Pen Inject 26 Units into the skin daily.     loratadine (CLARITIN) 10 MG tablet Take 10 mg by mouth daily as needed for allergies.     metFORMIN (GLUCOPHAGE) 1000 MG tablet Take 1 tablet (1,000 mg total) by mouth 2 (two) times daily.     methocarbamol (ROBAXIN) 500 MG tablet Take 1 tablet (500 mg total) by mouth 2 (two) times daily. (Patient taking differently: Take 500 mg by mouth every 8 (eight) hours as needed.) 20 tablet 0   metoprolol succinate (TOPROL-XL) 25 MG 24 hr tablet Take 25 mg by mouth daily.     mometasone (ASMANEX) 220 MCG/INH inhaler  Inhale 1 puff into the lungs daily.     Multiple Vitamin (MULTIVITAMIN ADULT PO) Take 1 tablet by mouth daily. 1st Phorm multivitamin.     Omega-3 Fatty Acids (FISH OIL PO) Take 1,000 mg by mouth 2 (two) times daily.     ONE TOUCH ULTRA TEST test strip 1 each by Other route as needed (GLUCOSE).      pioglitazone (  ACTOS) 30 MG tablet Take 30 mg by mouth daily.     repaglinide (PRANDIN) 2 MG tablet Take 2 mg by mouth 2 (two) times daily.     lisinopril (ZESTRIL) 5 MG tablet Take 1 tablet (5 mg total) by mouth daily. 90 tablet 0   No current facility-administered medications for this visit.    Allergies:   Empagliflozin    Social History:  The patient  reports that he has never smoked. He has never used smokeless tobacco. He reports current alcohol use of about 1.0 standard drink of alcohol per week. He reports that he does not use drugs.   Family History:  The patient's family history includes Cancer in his mother; Colon cancer in an other family member; Colon cancer (age of onset: 72) in his maternal aunt; Colon cancer (age of onset: 73) in an other family member; Congestive Heart Failure in his maternal grandfather, maternal grandmother, and paternal uncle; Healthy in his sister; Heart attack in his maternal grandfather and paternal grandfather; Heart disease in his father; Hernia in his father; Hypotension in his mother; Stroke in his paternal grandmother.    ROS:  Please see the history of present illness.   Otherwise, review of systems are positive for left leg swelling.   All other systems are reviewed and negative.    PHYSICAL EXAM: VS:  BP (!) 146/96   Pulse 83   Ht 6' (1.829 m)   Wt 220 lb 9.6 oz (100.1 kg)   SpO2 97%   BMI 29.92 kg/m  , BMI Body mass index is 29.92 kg/m. GEN: Well nourished, well developed, in no acute distress HEENT: normal Neck: no JVD, carotid bruits, or masses Cardiac: RRR; no murmurs, rubs, or gallops  Respiratory:  clear to auscultation bilaterally,  normal work of breathing GI: soft, nontender, nondistended, + BS MS: left leg swelling, prominent veins Skin: warm and dry, no rash Neuro:  Strength and sensation are intact Psych: euthymic mood, full affect   EKG:   The ekg ordered 10/23 demonstrates NSR, no ST changes   Recent Labs: 12/29/2021: ALT 29; Hemoglobin 15.4; Platelets 153 01/24/2022: BUN 20; Creatinine 1.11; Potassium 4.6; Sodium 137   Lipid Panel    Component Value Date/Time   CHOL 116 03/05/2014 0630   TRIG 122 03/05/2014 0630   HDL 21 (L) 03/05/2014 0630   CHOLHDL 5.5 03/05/2014 0630   VLDL 24 03/05/2014 0630   LDLCALC 71 03/05/2014 0630     Other studies Reviewed: Additional studies/ records that were reviewed today with results demonstrating: labs reviewed.   ASSESSMENT AND PLAN:  Status post mitral valve repair: SBE prophylaxis.  Check echocardiogram given recent onset dyspnea on exertion.  I think it is likely combination of weight gain and some deconditioning given his other illnesses that he has had in the last 6 months. A-fib/a flutter: Had been on amiodarone but this was stopped.  Was using Apple Watch to see if he had any recurrence of atrial fibrillation.  Has not had atrial fibrillation but is now back on anticoagulation with Pradaxa due to DVT. Hyperlipidemia: LDL 94.  Whole food, plant-based diet. Diabetes: A1C 7.6 in 11/23.  Trying to increase his exercise and improve diet.  A1c has come down from 8.0. HTN: Increase lisinopril to 10 mg daily.  Check blood pressure at home.  Bmet in 1 week.   Current medicines are reviewed at length with the patient today.  The patient concerns regarding his medicines were addressed.  The  following changes have been made:  No change  Labs/ tests ordered today include:  No orders of the defined types were placed in this encounter.   Recommend 150 minutes/week of aerobic exercise Low fat, low carb, high fiber diet recommended  Disposition:   FU in 6  months   Signed, Larae Grooms, MD  04/04/2022 1:35 PM    [facility] [address], [place], [place]  [address] Phone: [phone]; Fax: [phone]

## 2022-04-04 ENCOUNTER — Encounter: Payer: Self-pay | Admitting: Interventional Cardiology

## 2022-04-04 ENCOUNTER — Ambulatory Visit: Payer: BC Managed Care – PPO | Attending: Interventional Cardiology | Admitting: Interventional Cardiology

## 2022-04-04 VITALS — BP 146/96 | HR 83 | Ht 72.0 in | Wt 220.6 lb

## 2022-04-04 DIAGNOSIS — E782 Mixed hyperlipidemia: Secondary | ICD-10-CM | POA: Diagnosis not present

## 2022-04-04 DIAGNOSIS — E1159 Type 2 diabetes mellitus with other circulatory complications: Secondary | ICD-10-CM

## 2022-04-04 DIAGNOSIS — I10 Essential (primary) hypertension: Secondary | ICD-10-CM | POA: Diagnosis not present

## 2022-04-04 DIAGNOSIS — R0609 Other forms of dyspnea: Secondary | ICD-10-CM

## 2022-04-04 DIAGNOSIS — Z9889 Other specified postprocedural states: Secondary | ICD-10-CM

## 2022-04-04 MED ORDER — LISINOPRIL 10 MG PO TABS: 10.0000 mg | ORAL_TABLET | Freq: Every day | ORAL | 3 refills | Status: DC

## 2022-04-04 NOTE — Patient Instructions (Signed)
Medication Instructions:  Your physician has recommended you make the following change in your medication: Increase lisinopril to 10 mg by mouth daily   *If you need a refill on your cardiac medications before your next appointment, please call your pharmacy*   Lab Work: Your physician recommends that you return for lab work in: 1-2 weeks.  BMP and BNP.  This is not fasting  If you have labs (blood work) drawn today and your tests are completely normal, you will receive your results only by: [place] (if you have MyChart) OR A paper copy in the mail If you have any lab test that is abnormal or we need to change your treatment, we will call you to review the results.   Testing/Procedures: Your physician has requested that you have an echocardiogram. Echocardiography is a painless test that uses sound waves to create images of your heart. It provides your doctor with information about the size and shape of your heart and how well your heart's chambers and valves are working. This procedure takes approximately one hour. There are no restrictions for this procedure. Please do NOT wear cologne, perfume, aftershave, or lotions (deodorant is allowed). Please arrive 15 minutes prior to your appointment time.    Follow-Up: At [facility], you and your health needs are our priority.  As part of our continuing mission to provide you with exceptional heart care, we have created designated Provider Care Teams.  These Care Teams include your primary Cardiologist (physician) and Advanced Practice Providers (APPs -  Physician Assistants and Nurse Practitioners) who all work together to provide you with the care you need, when you need it.  We recommend signing up for the patient portal called "MyChart".  Sign up information is provided on this After Visit Summary.  MyChart is used to connect with patients for Virtual Visits (Telemedicine).  Patients are able to view lab/test results,  encounter notes, upcoming appointments, etc.  Non-urgent messages can be sent to your provider as well.   To learn more about what you can do with MyChart, go to [email or web address].    Your next appointment:   6 month(s)  The format for your next appointment:   In Person  Provider:   Larae Grooms, MD     Other Instructions    Important Information About Sugar

## 2022-04-07 ENCOUNTER — Other Ambulatory Visit: Payer: Self-pay | Admitting: *Deleted

## 2022-04-07 ENCOUNTER — Encounter: Payer: Self-pay | Admitting: Nurse Practitioner

## 2022-04-07 ENCOUNTER — Encounter: Payer: Self-pay | Admitting: *Deleted

## 2022-04-07 DIAGNOSIS — C186 Malignant neoplasm of descending colon: Secondary | ICD-10-CM

## 2022-04-07 NOTE — Progress Notes (Signed)
Called and updated [name] on IR response to request for more scans in order to perform biopsy, Dr Benay Spice ordered CT Lumbar spine and CT abdomen/pelvis without contrast to be performed and then request a bx. Pt verbalized understanding. Scans authorized and message sent to scheduling team

## 2022-04-08 ENCOUNTER — Encounter: Payer: Self-pay | Admitting: Orthopedic Surgery

## 2022-04-08 ENCOUNTER — Other Ambulatory Visit: Payer: Self-pay | Admitting: Orthopedic Surgery

## 2022-04-08 ENCOUNTER — Telehealth: Payer: Self-pay | Admitting: Orthopedic Surgery

## 2022-04-08 MED ORDER — TRAMADOL HCL 50 MG PO TABS: 50.0000 mg | ORAL_TABLET | Freq: Four times a day (QID) | ORAL | 0 refills | Status: DC | PRN

## 2022-04-08 NOTE — Telephone Encounter (Signed)
History of colon cancer, current treatment for prostate cancer. He recently had bone scan.  I worked him in for a time that was good for him next Wednesday.  However, he is having a lot of pain and finding it difficult to get relief. Wanted to know if you could prescribe anything for pain.  Working from home trying to get comfortable, unable to sit, not sleeping.

## 2022-04-08 NOTE — Telephone Encounter (Signed)
Pt called stating someone called him about an appt and no name was left on voicemail. No notes on pt chart about an appt. Did not see a referral for pt to see Dr Ernestina Patches for back pains. Pt is asking for a call back to see if he can be seen next week. Pt states his voicemail they can work him in. Please call pt at [phone]

## 2022-04-08 NOTE — Telephone Encounter (Signed)
I called advised

## 2022-04-08 NOTE — Telephone Encounter (Signed)
Tramadol sent pls calal thx

## 2022-04-09 ENCOUNTER — Other Ambulatory Visit: Payer: BC Managed Care – PPO

## 2022-04-10 ENCOUNTER — Other Ambulatory Visit: Payer: Self-pay | Admitting: Urology

## 2022-04-10 DIAGNOSIS — C61 Malignant neoplasm of prostate: Secondary | ICD-10-CM

## 2022-04-11 ENCOUNTER — Ambulatory Visit (INDEPENDENT_AMBULATORY_CARE_PROVIDER_SITE_OTHER): Payer: BC Managed Care – PPO

## 2022-04-11 ENCOUNTER — Encounter: Payer: Self-pay | Admitting: Orthopedic Surgery

## 2022-04-11 ENCOUNTER — Ambulatory Visit: Payer: BC Managed Care – PPO | Admitting: Orthopedic Surgery

## 2022-04-11 DIAGNOSIS — K8020 Calculus of gallbladder without cholecystitis without obstruction: Secondary | ICD-10-CM | POA: Diagnosis not present

## 2022-04-11 DIAGNOSIS — M4126 Other idiopathic scoliosis, lumbar region: Secondary | ICD-10-CM | POA: Diagnosis not present

## 2022-04-11 DIAGNOSIS — M5441 Lumbago with sciatica, right side: Secondary | ICD-10-CM | POA: Diagnosis not present

## 2022-04-11 DIAGNOSIS — C186 Malignant neoplasm of descending colon: Secondary | ICD-10-CM | POA: Diagnosis not present

## 2022-04-11 DIAGNOSIS — M5442 Lumbago with sciatica, left side: Secondary | ICD-10-CM | POA: Diagnosis not present

## 2022-04-11 DIAGNOSIS — C7951 Secondary malignant neoplasm of bone: Secondary | ICD-10-CM | POA: Diagnosis not present

## 2022-04-11 NOTE — Progress Notes (Signed)
Office Visit Note   Patient: [name]           Date of Birth: 23-Jul-1965           MRN: [id number] Visit Date: 04/11/2022 Requested by: Orpah Melter, MD [address] [place],  [place] [address] PCP: Orpah Melter, MD  Subjective: Chief Complaint  Patient presents with   Lower Back - Pain    HPI: [name] is a 57 y.o. male who presents to the office reporting severe back pain.  Started 03/24/2022.  He noted that his symptoms started when he was driving in the car for 5 to 6 hours on that day.  Became worse after he was lifting his mother suitcase on 03/31/2022.  Reports primarily back pain radiating into the buttocks but not down the legs.  There is an extensive workup ongoing regarding possible metastatic cancer.  He does have a history of kidney: Prostate cancer.  This was found 2 years ago.  He also has a history of scoliosis.  He has tried stretching and Voltaren and Aleve and Tylenol and Advil without relief.  Has tried doing a home stretching exercise program since the onset.  Does have difficulty sleeping and getting comfortable.  Used tramadol with some relief.  He is okay with coughing and sneezing.  Denies any fevers or chills.  CBGs are okay..                ROS: All systems reviewed are negative as they relate to the chief complaint within the history of present illness.  Patient denies fevers or chills.  Assessment & Plan: Visit Diagnoses:  1. Low back pain with bilateral sciatica, unspecified back pain laterality, unspecified chronicity     Plan: Impression is severe back pain.  He does have significant scoliosis which makes plain radiographic interpretation difficult.  He does have bone scan which shows increased uptake in the lumbar spine which is concerning for possible metastatic disease.  From our perspective and orthopedics his best option for relief from this back pain would be injections.  To that end an MRI scan is required.  Based on his  symptoms as well as plain radiographic finding of endplate compression which cannot really be distinguishable between degenerative or acute in the L2 vertebral body and MRI scan is indicated.  Follow-up after that study or we could potentially just review the scan report and send him to Dr. Ernestina Patches for injections.  Follow-Up Instructions: No follow-ups on file.   Orders:  Orders Placed This Encounter  Procedures   XR Pelvis 1-2 Views   XR Lumbar Spine 2-3 Views   MR Lumbar Spine w/o contrast   No orders of the defined types were placed in this encounter.     Procedures: No procedures performed   Clinical Data: No additional findings.  Objective: Vital Signs: There were no vitals taken for this visit.  Physical Exam:  Constitutional: Patient appears well-developed HEENT:  Head: Normocephalic Eyes:EOM are normal Neck: Normal range of motion Cardiovascular: Normal rate Pulmonary/chest: Effort normal Neurologic: Patient is alert Skin: Skin is warm Psychiatric: Patient has normal mood and affect  Ortho Exam: Ortho exam demonstrates full active and passive range of motion of ankles and knees.  He has 5 out of 5 ankle dorsiflexion plantarflexion quad and hamstring strength.  Hip flexion slightly weaker on the right than the left.  No groin pain with internal extra rotation of the leg.  He has significant  poliosis noted on visual inspection of his back.  Mild pain with forward and lateral bending but no trochanteric tenderness is present.  Sensation intact L1-S1 bilaterally.  Reflexes symmetric 0 to 1+ out of 4 bilateral patella and Achilles.  Specialty Comments:  No specialty comments available.  Imaging: XR Pelvis 1-2 Views  Result Date: 04/11/2022 AP pelvis radiographs reviewed.  Minimal degenerative arthritis in either hip.  No acute fracture.  No destructive bony lesions present within the bony pelvis or proximal femurs.  XR Lumbar Spine 2-3 Views  Result Date:  04/11/2022 AP lateral radiographs lumbar spine reviewed.  Significant scoliosis is present.  Spondylolisthesis present in the middle lumbar spine region.  Degenerative compression is noted in the L2 vertebral body.  No destructive bony processes within the vertebral body.    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   Family history of colon cancer    Family history of thyroid cancer    Screen for colon cancer 01/07/2019   Type 2 diabetes mellitus ([facility]) 01/11/2015   Mitral regurgitation 07/06/2014   Discitis of lumbar region 03/08/2014   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[facility])    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   Solitary kidney, acquired 03/07/2014   Malnutrition of moderate degree ([facility]) 03/01/2014   CVA (cerebral infarction)- embolic 82/99/3716   Severe sepsis ([facility])    Intractable back pain 02/27/2014   Hyperglycemia 02/27/2014   Hyperlipidemia 02/27/2014   Lipomatosis, 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   Family history of ischemic heart disease 04/05/2013   HYPERTENSION, BENIGN 03/12/2008   PALPITATIONS 03/12/2008   Past Medical History:  Diagnosis Date   Allergic rhinitis    Asthma    animal dander & seasonal asthma   Colon cancer ([facility]) 01/26/2019   Diabetes mellitus without complication ([facility])    dx 2010  type 2   Discitis of lumbosacral region    first started with this ..and rolled onto  the endocarditis    stayed a month   Endocarditis of mitral valve    11/2013 from back strep infection   Family history of colon cancer    Family history of thyroid cancer    H/O scoliosis    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   History of hiatal hernia    Hyperlipidemia    Hypertension    Diagnostic exercise tolerance test assessment:04/30/2010 : comments normal -no evidence os ischemia by ST analysis   lipoma    Lipoma 01/26/2019   Mitral regurgitation    Echo 12/2018: EF 60-65, myxomatous mitral valve, severe mitral regurgitation, partial flail leaflet of posterior mitral valve, myxomatous tricuspid valve with trivial TR, ascending aorta mildly dilated (39 mm)   Pneumonia    as child   PONV (postoperative nausea and vomiting)    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   Complex valvuloplasty including artificial Gore-tex neochord placement x6 with 28 mm Sorin Memo 4D ring annuloplasty via right mini thoracotomy approach  Solitary kidney    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    Family History  Problem Relation Age of Onset   Heart disease Father    Hernia Father    Healthy Sister    Stroke Paternal Grandmother    Cancer Mother        THYROID   Hypotension Mother    Heart attack Maternal Grandfather    Congestive Heart Failure Maternal Grandfather    Heart attack Paternal Grandfather    Colon cancer Maternal Aunt 79   Congestive Heart Failure Paternal Uncle    Congestive Heart Failure Maternal Grandmother    Colon cancer Other 83       MGMs brother   Colon cancer Other        MGFs brother    Past Surgical History:  Procedure Laterality Date   ABDOMINAL AORTOGRAM N/A 02/10/2020   Procedure: ABDOMINAL AORTOGRAM;  Surgeon: Jettie Booze, MD;  Location: [facility];  Service: Cardiovascular;  Laterality: N/A;   APPLICATION OF WOUND VAC  08/15/2021   Procedure: APPLICATION OF WOUND VAC;  Surgeon: Rolm Bookbinder,  MD;  Location: [facility];  Service: General;;   BIOPSY  01/07/2019   Procedure: BIOPSY;  Surgeon: Wilford Corner, MD;  Location: [facility];  Service: Endoscopy;;   BIOPSY  01/03/2020   Procedure: BIOPSY;  Surgeon: Wilford Corner, MD;  [facility]: [facility];  Service: Endoscopy;;   COLON SURGERY  03/04/2019   left colon segmental resection   COLONOSCOPY WITH PROPOFOL N/A 01/07/2019   Procedure: COLONOSCOPY WITH PROPOFOL;  Surgeon: Wilford Corner, MD;  [facility];  Service: Endoscopy;  Laterality: N/A;   COLONOSCOPY WITH PROPOFOL N/A 01/03/2020   Procedure: COLONOSCOPY WITH PROPOFOL;  Surgeon: Wilford Corner, MD;  [facility];  Service: Endoscopy;  Laterality: N/A;   INCISION AND DRAINAGE OF WOUND Right 08/13/2021   Procedure: IRRIGATION AND DEBRIDEMENT WOUND RIGHT GROIN;  Surgeon: Ralene Ok, MD;  Location: [facility];  Service: General;  Laterality: Right;   INGUINAL HERNIA REPAIR Left 04/23/2016   Procedure: LAPAROSCOPIC  INGUINAL REPAIR;  Surgeon: Clovis Riley, MD;  Location: [facility];  Service: General;  Laterality: Left;   IRRIGATION AND DEBRIDEMENT ABSCESS Right 08/15/2021   Procedure: IRRIGATION AND DEBRIDEMENT OF GROIN;  Surgeon: Rolm Bookbinder, MD;  Location: [facility];  Service: General;  Laterality: Right;   KNEE SURGERY     arthroscopic left knee   LIPOMA EXCISION  2015   x20 [facility]   MITRAL VALVE REPAIR Right 05/09/2020   Procedure: MINIMALLY INVASIVE MITRAL VALVE REPAIR (MVR) USING LIVANOVA MEMO 4D 28MM MITRAL RING;  Surgeon: [name], MD;  Location: [facility];  Service: Open Heart Surgery;  Laterality: Right;   POLYPECTOMY  01/03/2020   Procedure: POLYPECTOMY;  Surgeon: Wilford Corner, MD;  [facility];  Service: Endoscopy;;   PORT-A-CATH REMOVAL     PORTACATH PLACEMENT N/A 03/16/2019   Procedure: INSERTION PORT-A-CATH;  Surgeon: Michael Boston, MD;  [facility];  Service: General;  Laterality: N/A;   RIGHT/LEFT HEART CATH AND  CORONARY ANGIOGRAPHY N/A 02/10/2020   Procedure: RIGHT/LEFT HEART CATH AND CORONARY ANGIOGRAPHY;  Surgeon: Jettie Booze, MD;  Location: [facility];  Service: Cardiovascular;  Laterality: N/A;   SUBMUCOSAL INJECTION  01/07/2019   Procedure: SUBMUCOSAL INJECTION;  Surgeon: Wilford Corner, MD;  [facility];  Service: Endoscopy;;   TEE WITHOUT CARDIOVERSION N/A 02/10/2020   Procedure: TRANSESOPHAGEAL ECHOCARDIOGRAM (TEE);  Surgeon: Acie Fredrickson Wonda Cheng, MD;  Location: [facility];  Service: Cardiovascular;  Laterality: N/A;  CATH AFTER TEE   TEE WITHOUT CARDIOVERSION N/A 05/09/2020   Procedure: TRANSESOPHAGEAL ECHOCARDIOGRAM (TEE);  Surgeon: [name], MD;  Location: [facility];  Service: Open Heart Surgery;  Laterality: N/A;   THYROIDECTOMY, PARTIAL  01/05/2009   Right Thyroid Lobectomy   TONSILLECTOMY     TOTAL NEPHRECTOMY Left 1970   Wilms Tumor left kidney excised as an infant   Social History   Occupational History   Occupation: executive  Tobacco Use   Smoking status: Never   Smokeless tobacco: Never  Vaping Use   Vaping Use: Never used  Substance and Sexual Activity   Alcohol use: Yes    Alcohol/week: 1.0 standard drink of alcohol    Types: 1 Glasses of wine per week    Comment: occasional   Drug use: No   Sexual activity: Yes

## 2022-04-14 ENCOUNTER — Other Ambulatory Visit: Payer: Self-pay | Admitting: Nurse Practitioner

## 2022-04-14 ENCOUNTER — Inpatient Hospital Stay: Payer: BC Managed Care – PPO | Attending: Oncology

## 2022-04-14 ENCOUNTER — Other Ambulatory Visit: Payer: Self-pay | Admitting: *Deleted

## 2022-04-14 DIAGNOSIS — C61 Malignant neoplasm of prostate: Secondary | ICD-10-CM

## 2022-04-14 DIAGNOSIS — C7951 Secondary malignant neoplasm of bone: Secondary | ICD-10-CM | POA: Diagnosis not present

## 2022-04-14 DIAGNOSIS — R918 Other nonspecific abnormal finding of lung field: Secondary | ICD-10-CM | POA: Insufficient documentation

## 2022-04-14 DIAGNOSIS — Z8546 Personal history of malignant neoplasm of prostate: Secondary | ICD-10-CM | POA: Insufficient documentation

## 2022-04-14 DIAGNOSIS — Z85038 Personal history of other malignant neoplasm of large intestine: Secondary | ICD-10-CM | POA: Insufficient documentation

## 2022-04-14 DIAGNOSIS — C186 Malignant neoplasm of descending colon: Secondary | ICD-10-CM

## 2022-04-14 LAB — CMP (CANCER CENTER ONLY)
ALT: 19 U/L (ref 0–44)
AST: 19 U/L (ref 15–41)
Albumin: 4.1 g/dL (ref 3.5–5.0)
Alkaline Phosphatase: 175 U/L — ABNORMAL HIGH (ref 38–126)
Anion gap: 10 (ref 5–15)
BUN: 19 mg/dL (ref 6–20)
CO2: 27 mmol/L (ref 22–32)
Calcium: 9.4 mg/dL (ref 8.9–10.3)
Chloride: 99 mmol/L (ref 98–111)
Creatinine: 0.94 mg/dL (ref 0.61–1.24)
GFR, Estimated: >60 mL/min (ref >60)
Glucose, Bld: 212 mg/dL — ABNORMAL HIGH (ref 70–99)
Potassium: 4.2 mmol/L (ref 3.5–5.1)
Sodium: 136 mmol/L (ref 135–145)
Total Bilirubin: 0.6 mg/dL (ref 0.3–1.2)
Total Protein: 7.6 g/dL (ref 6.5–8.1)

## 2022-04-14 LAB — CBC WITH DIFFERENTIAL (CANCER CENTER ONLY)
Abs Immature Granulocytes: 0.03 10*3/uL (ref 0.00–0.07)
Basophils Absolute: 0.1 10*3/uL (ref 0.0–0.1)
Basophils Relative: 1 %
Eosinophils Absolute: 0.2 10*3/uL (ref 0.0–0.5)
Eosinophils Relative: 2 %
HCT: 44.4 % (ref 39.0–52.0)
Hemoglobin: 15 g/dL (ref 13.0–17.0)
Immature Granulocytes: 1 %
Lymphocytes Relative: 15 %
Lymphs Abs: 1 10*3/uL (ref 0.7–4.0)
MCH: 31.6 pg (ref 26.0–34.0)
MCHC: 33.8 g/dL (ref 30.0–36.0)
MCV: 93.5 fL (ref 80.0–100.0)
Monocytes Absolute: 0.5 10*3/uL (ref 0.1–1.0)
Monocytes Relative: 7 %
Neutro Abs: 4.9 10*3/uL (ref 1.7–7.7)
Neutrophils Relative %: 74 %
Platelet Count: 206 10*3/uL (ref 150–400)
RBC: 4.75 MIL/uL (ref 4.22–5.81)
RDW: 12.4 % (ref 11.5–15.5)
WBC Count: 6.6 10*3/uL (ref 4.0–10.5)
nRBC: 0 % (ref 0.0–0.2)

## 2022-04-14 LAB — LACTATE DEHYDROGENASE: LDH: 195 U/L — ABNORMAL HIGH (ref 98–192)

## 2022-04-14 NOTE — Progress Notes (Signed)
[name], David Glaser, MD  [name] D; P [facility] Procedure Requests BIOPSY REVIEW Date: 04/14/22  Requested Biopsy site: Hx Colon CA Reason for request: Osseous metastases  Imaging review: I reviewed all pertinent diagnostic studies, including; CT AP and L spine, 04/11/22  Recommended imaging modality to perform biopsy: CT Additional comments: RIGHT iliac bone lesion.  '@Schedulers'$ . ASA hold. Will need PT / INR Pt has bone marrow lab panel PENDING. Should that result positive then Dr Benay Spice may convert this request to a Bone Marrow Biopsy.  Please contact me with questions, concerns, or if issue pertaining to this request arise.  Michaelle Birks, MD [facility] Radiology  Pager. [phone]

## 2022-04-15 ENCOUNTER — Encounter: Payer: Self-pay | Admitting: Interventional Cardiology

## 2022-04-15 DIAGNOSIS — C61 Malignant neoplasm of prostate: Secondary | ICD-10-CM | POA: Diagnosis not present

## 2022-04-15 LAB — KAPPA/LAMBDA LIGHT CHAINS
Kappa free light chain: 38.8 mg/L — ABNORMAL HIGH (ref 3.3–19.4)
Kappa, lambda light chain ratio: 1.55 (ref 0.26–1.65)
Lambda free light chains: 25.1 mg/L (ref 5.7–26.3)

## 2022-04-15 LAB — BETA 2 MICROGLOBULIN, SERUM: Beta-2 Microglobulin: 1.7 mg/L (ref 0.6–2.4)

## 2022-04-15 LAB — PROSTATE-SPECIFIC AG, SERUM (LABCORP): Prostate Specific Ag, Serum: 6.1 ng/mL — ABNORMAL HIGH (ref 0.0–4.0)

## 2022-04-16 ENCOUNTER — Other Ambulatory Visit: Payer: Self-pay | Admitting: Oncology

## 2022-04-16 ENCOUNTER — Ambulatory Visit: Payer: BC Managed Care – PPO | Admitting: Surgical

## 2022-04-16 NOTE — Progress Notes (Signed)
It is okay to hold the dabigatran for  2 days prior to the bone biopsy procedure.  The dabigatran can be resumed after the procedure when felt to be safe per physician performing the biopsy

## 2022-04-18 ENCOUNTER — Other Ambulatory Visit: Payer: Self-pay | Admitting: Internal Medicine

## 2022-04-18 ENCOUNTER — Ambulatory Visit: Payer: BC Managed Care – PPO | Attending: Interventional Cardiology

## 2022-04-18 DIAGNOSIS — R0609 Other forms of dyspnea: Secondary | ICD-10-CM | POA: Diagnosis not present

## 2022-04-18 DIAGNOSIS — E1159 Type 2 diabetes mellitus with other circulatory complications: Secondary | ICD-10-CM

## 2022-04-18 DIAGNOSIS — M899 Disorder of bone, unspecified: Secondary | ICD-10-CM

## 2022-04-18 DIAGNOSIS — Z9889 Other specified postprocedural states: Secondary | ICD-10-CM | POA: Diagnosis not present

## 2022-04-18 DIAGNOSIS — I10 Essential (primary) hypertension: Secondary | ICD-10-CM | POA: Diagnosis not present

## 2022-04-18 LAB — MULTIPLE MYELOMA PANEL, SERUM
Albumin SerPl Elph-Mcnc: 3.5 g/dL (ref 2.9–4.4)
Albumin/Glob SerPl: 1.2 (ref 0.7–1.7)
Alpha 1: 0.2 g/dL (ref 0.0–0.4)
Alpha2 Glob SerPl Elph-Mcnc: 0.8 g/dL (ref 0.4–1.0)
B-Globulin SerPl Elph-Mcnc: 1 g/dL (ref 0.7–1.3)
Gamma Glob SerPl Elph-Mcnc: 1.2 g/dL (ref 0.4–1.8)
Globulin, Total: 3.1 g/dL (ref 2.2–3.9)
IgA: 258 mg/dL (ref 90–386)
IgG (Immunoglobin G), Serum: 1220 mg/dL (ref 603–1613)
IgM (Immunoglobulin M), Srm: 27 mg/dL (ref 20–172)
Total Protein ELP: 6.6 g/dL (ref 6.0–8.5)

## 2022-04-18 NOTE — Progress Notes (Signed)
Patient for CT guided RT Iliac Bone Lesion Biopsy on Monday 04/21/2022, I called and spoke with the patient on the phone and gave pre-procedure instructions. Pt was made aware to be here at 9:30a at the new entrance, last dose of ASA 81 mg is on Tues 04/15/2022 and last does of Pradaxa is on Friday 04/18/2022,  NPO after MN prior to procedure as well as driver post procedure/recovery/discharge. Pt stated understanding.  Called and spoke with the patient on Wed 04/16/2022 and again LVM on Friday 04/18/2022

## 2022-04-18 NOTE — H&P (Signed)
Chief Complaint: Patient was seen in consultation today for right iliac bone lesion at the request of Owens Shark  Referring Physician(s): Owens Shark  Supervising Physician: Corrie Mckusick  Patient Status: [facility] - Out-pt  History of Present Illness: [name] is a 57 y.o. male with PMH significant for colon and prostate cancer,  . Pt is currently being followed by oncology and receiving treatment. He underwent CT AP 04/11/22 for evaluation of metastatic disease that found numerous mixed lytic and sclerotic lesions throughout axial and appendicular skeleton with the largest being right ilium measuring 3.0 x 2.0 cm. Pt has been referred to IR for right iliac bone lesion biopsy. Images were reviewed and approved by Dr. Maryelizabeth Kaufmann.   Pt denies fever, chills, CP, SOB, abd pain, loss of appetite, N/V, dizziness or HA.  He endorses fatigue, back pain, and intermittent weakness.  He is NPO per order.   Pt states he usually takes prophylactic abx d/t mitral valve repair and is concerned for infection. Pt advised abx would be ordered.   Last Pradaxa 3 days ago. Last ASA 6 days ago.      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   History of hiatal hernia    Hyperlipidemia    Hypertension    Diagnostic exercise tolerance test assessment:04/30/2010 : comments normal -no evidence os ischemia by ST analysis   lipoma    Lipoma 01/26/2019   Mitral regurgitation    Echo 12/2018: EF 60-65, myxomatous mitral valve, severe  mitral regurgitation, partial flail leaflet of posterior mitral valve, myxomatous tricuspid valve with trivial TR, ascending aorta mildly dilated (39 mm)   Pneumonia    as child   PONV (postoperative nausea and vomiting)    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   Complex valvuloplasty including artificial Gore-tex neochord placement x6 with 28 mm Sorin Memo 4D ring annuloplasty via right mini thoracotomy approach   Solitary kidney    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    Past Surgical History:  Procedure Laterality Date   ABDOMINAL AORTOGRAM N/A 02/10/2020   Procedure: ABDOMINAL AORTOGRAM;  Surgeon: Jettie Booze, MD;  Location: [place] [facility];  Service: Cardiovascular;  Laterality: N/A;   APPLICATION OF WOUND VAC  08/15/2021   Procedure: APPLICATION OF WOUND VAC;  Surgeon: Rolm Bookbinder, MD;  Location: [place];  Service: General;;   BIOPSY  01/07/2019   Procedure: BIOPSY;  Surgeon: Wilford Corner, MD;  [facility];  Service: Endoscopy;;   BIOPSY  01/03/2020   Procedure: BIOPSY;  Surgeon: Wilford Corner, MD;  [facility];  Service: Endoscopy;;   COLON SURGERY  03/04/2019   left colon segmental resection   COLONOSCOPY WITH PROPOFOL N/A 01/07/2019   Procedure: COLONOSCOPY WITH PROPOFOL;  Surgeon: Wilford Corner, MD;  [facility];  Service: Endoscopy;  Laterality: N/A;   COLONOSCOPY WITH  PROPOFOL N/A 01/03/2020   Procedure: COLONOSCOPY WITH PROPOFOL;  Surgeon: Wilford Corner, MD;  [facility];  Service: Endoscopy;  Laterality: N/A;   INCISION AND DRAINAGE OF WOUND Right 08/13/2021   Procedure: IRRIGATION AND DEBRIDEMENT WOUND RIGHT GROIN;  Surgeon: Ralene Ok, MD;  Location: [place];  Service: General;  Laterality: Right;   INGUINAL HERNIA REPAIR Left 04/23/2016   Procedure: LAPAROSCOPIC  INGUINAL REPAIR;  Surgeon: Clovis Riley, MD;  Location: [place];  Service: General;   Laterality: Left;   IRRIGATION AND DEBRIDEMENT ABSCESS Right 08/15/2021   Procedure: IRRIGATION AND DEBRIDEMENT OF GROIN;  Surgeon: Rolm Bookbinder, MD;  Location: [place];  Service: General;  Laterality: Right;   KNEE SURGERY     arthroscopic left knee   LIPOMA EXCISION  2015   x20 [facility]   MITRAL VALVE REPAIR Right 05/09/2020   Procedure: MINIMALLY INVASIVE MITRAL VALVE REPAIR (MVR) USING LIVANOVA MEMO 4D 28MM MITRAL RING;  Surgeon: [name], MD;  Location: [place];  Service: Open Heart Surgery;  Laterality: Right;   POLYPECTOMY  01/03/2020   Procedure: POLYPECTOMY;  Surgeon: Wilford Corner, MD;  [facility];  Service: Endoscopy;;   PORT-A-CATH REMOVAL     PORTACATH PLACEMENT N/A 03/16/2019   Procedure: INSERTION PORT-A-CATH;  Surgeon: Michael Boston, MD;  [facility];  Service: General;  Laterality: N/A;   RIGHT/LEFT HEART CATH AND CORONARY ANGIOGRAPHY N/A 02/10/2020   Procedure: RIGHT/LEFT HEART CATH AND CORONARY ANGIOGRAPHY;  Surgeon: Jettie Booze, MD;  Location: [place] [facility];  Service: Cardiovascular;  Laterality: N/A;   SUBMUCOSAL INJECTION  01/07/2019   Procedure: SUBMUCOSAL INJECTION;  Surgeon: Wilford Corner, MD;  [facility];  Service: Endoscopy;;   TEE WITHOUT CARDIOVERSION N/A 02/10/2020   Procedure: TRANSESOPHAGEAL ECHOCARDIOGRAM (TEE);  Surgeon: Acie Fredrickson Wonda Cheng, MD;  Location: [place];  Service: Cardiovascular;  Laterality: N/A;  CATH AFTER TEE   TEE WITHOUT CARDIOVERSION N/A 05/09/2020   Procedure: TRANSESOPHAGEAL ECHOCARDIOGRAM (TEE);  Surgeon: [name], MD;  Location: [place];  Service: Open Heart Surgery;  Laterality: N/A;   THYROIDECTOMY, PARTIAL  01/05/2009   Right Thyroid Lobectomy   TONSILLECTOMY     TOTAL NEPHRECTOMY Left 1970   Wilms Tumor left kidney excised as an infant    Allergies: Empagliflozin  Medications: Prior to Admission medications   Medication Sig Start Date End Date Taking? Authorizing  Provider  traMADol (ULTRAM) 50 MG tablet Take 1 tablet (50 mg total) by mouth every 6 (six) hours as needed. 04/08/22   Meredith Pel, MD  acetaminophen (TYLENOL) 500 MG tablet Take 1 tablet (500 mg total) by mouth daily as needed for mild pain. 12/29/21   Redwine, Madison A, PA-C  albuterol (PROVENTIL HFA;VENTOLIN HFA) 108 (90 BASE) MCG/ACT inhaler Inhale 1-2 puffs into the lungs every 6 (six) hours as needed for wheezing or shortness of breath.    [provider]  aspirin EC 81 MG EC tablet Take 1 tablet (81 mg total) by mouth daily. Swallow whole. Patient taking differently: Take 81 mg by mouth daily. 05/15/20   Antony Odea, PA-C  atorvastatin (LIPITOR) 10 MG tablet Take 1 tablet (10 mg total) by mouth daily. 03/15/14   Angiulli, Lavon Paganini, PA-C  calcium carbonate (TUMS EX) 750 MG chewable tablet Chew 1,500 mg by mouth daily as needed for heartburn.    [provider]  dabigatran (PRADAXA) 150 MG CAPS capsule Take 150 mg by mouth 2 (two) times daily.  [provider]  fluticasone (FLONASE) 50 MCG/ACT nasal spray Place 1 spray into both nostrils daily as needed for allergies or rhinitis.    [provider]  furosemide (LASIX) 20 MG tablet Take 1 tablet (20 mg total) by mouth as needed. 11/07/21   Marylu Lund., NP  ibuprofen (ADVIL) 200 MG tablet Take 200 mg by mouth daily as needed for mild pain.    [provider]  insulin glargine, 1 Unit Dial, (TOUJEO SOLOSTAR) 300 UNIT/ML Solostar Pen Inject 26 Units into the skin daily. 04/05/21   [provider]  lisinopril (ZESTRIL) 10 MG tablet Take 1 tablet (10 mg total) by mouth daily. 04/04/22   Jettie Booze, MD  loratadine (CLARITIN) 10 MG tablet Take 10 mg by mouth daily as needed for allergies.    [provider]  metFORMIN (GLUCOPHAGE) 1000 MG tablet Take 1 tablet (1,000 mg total) by mouth 2 (two) times daily. 02/12/20   Jettie Booze, MD  methocarbamol  (ROBAXIN) 500 MG tablet Take 1 tablet (500 mg total) by mouth 2 (two) times daily. Patient taking differently: Take 500 mg by mouth every 8 (eight) hours as needed. 12/29/21   Redwine, Madison A, PA-C  metoprolol succinate (TOPROL-XL) 25 MG 24 hr tablet Take 25 mg by mouth daily.    [provider]  mometasone (ASMANEX) 220 MCG/INH inhaler Inhale 1 puff into the lungs daily.    [provider]  Multiple Vitamin (MULTIVITAMIN ADULT PO) Take 1 tablet by mouth daily. 1st Phorm multivitamin.    [provider]  Omega-3 Fatty Acids (FISH OIL PO) Take 1,000 mg by mouth 2 (two) times daily.    [provider]  ONE TOUCH ULTRA TEST test strip 1 each by Other route as needed (GLUCOSE).  05/06/15   [provider]  pioglitazone (ACTOS) 30 MG tablet Take 30 mg by mouth daily.    [provider]  repaglinide (PRANDIN) 2 MG tablet Take 2 mg by mouth 2 (two) times daily. 05/16/20   [provider]     Family History  Problem Relation Age of Onset   Heart disease Father    Hernia Father    Healthy Sister    Stroke Paternal Grandmother    Cancer Mother        THYROID   Hypotension Mother    Heart attack Maternal Grandfather    Congestive Heart Failure Maternal Grandfather    Heart attack Paternal Grandfather    Colon cancer Maternal Aunt 79   Congestive Heart Failure Paternal Uncle    Congestive Heart Failure Maternal Grandmother    Colon cancer Other 86       MGMs brother   Colon cancer Other        MGFs brother    Social History   Socioeconomic History   Marital status: Married    Spouse name: Not on file   Number of children: Not on file   Years of education: Not on file   Highest education level: Not on file  Occupational History   Occupation: executive  Tobacco Use   Smoking status: Never   Smokeless tobacco: Never  Vaping Use   Vaping Use: Never used  Substance and Sexual Activity   Alcohol use: Yes    Alcohol/week: 1.0  standard drink of alcohol    Types: 1 Glasses of wine per week    Comment: occasional   Drug use: No   Sexual activity: Yes  Other  Topics Concern   Not on file  Social History Narrative   Never smoked    Alcohol yes, rare ,1-2 per week   No recreational drugs   Occupation-  Geneticist, molecular and other executive courses   Marital status - married     Children 1 boy         Social Determinants of Radio broadcast assistant Strain: Not on file  Food Insecurity: Not on file  Transportation Needs: Not on file  Physical Activity: Not on file  Stress: Not on file  Social Connections: Not on file   Review of Systems: A 12 point ROS discussed and pertinent positives are indicated in the HPI above.  All other systems are negative.  Review of Systems  Constitutional:  Positive for fatigue. Negative for appetite change, chills and fever.  Respiratory:  Negative for shortness of breath.   Cardiovascular:  Negative for chest pain.  Gastrointestinal:  Negative for abdominal pain, nausea and vomiting.  Musculoskeletal:  Positive for back pain.  Neurological:  Negative for dizziness, light-headedness and headaches.    Vital Signs: BP (!) 144/94   Pulse 75   Temp 98.3 F (36.8 C) (Oral)   Resp 18   Ht 6' (1.829 m)   Wt 215 lb (97.5 kg)   SpO2 98%   BMI 29.16 kg/m     Physical Exam Vitals reviewed.  Constitutional:      General: He is not in acute distress.    Appearance: Normal appearance. He is not ill-appearing.  HENT:     Head: Normocephalic and atraumatic.     Mouth/Throat:     Mouth: Mucous membranes are dry.     Pharynx: Oropharynx is clear.  Eyes:     Extraocular Movements: Extraocular movements intact.     Pupils: Pupils are equal, round, and reactive to light.  Cardiovascular:     Rate and Rhythm: Normal rate and regular rhythm.     Pulses: Normal pulses.     Heart sounds: Normal heart sounds. No murmur heard. Pulmonary:     Effort: Pulmonary  effort is normal. No respiratory distress.     Breath sounds: Normal breath sounds.  Abdominal:     General: Bowel sounds are normal. There is no distension.     Palpations: Abdomen is soft.     Tenderness: There is no abdominal tenderness. There is no guarding.  Musculoskeletal:     Right lower leg: No edema.     Left lower leg: No edema.  Skin:    General: Skin is warm and dry.  Neurological:     Mental Status: He is alert and oriented to person, place, and time.  Psychiatric:        Mood and Affect: Mood normal.        Behavior: Behavior normal.        Thought Content: Thought content normal.        Judgment: Judgment normal.     Imaging: CT Abdomen Pelvis Wo Contrast  Result Date: 04/13/2022 CLINICAL DATA:  57 year old male with history of colon cancer. Evaluate for metastatic disease. * Tracking Code: BO * EXAM: CT ABDOMEN AND PELVIS WITHOUT CONTRAST CT LUMBAR SPINE WITHOUT CONTRAST TECHNIQUE: Multidetector CT imaging of the abdomen and pelvis was performed following the standard protocol without IV contrast. Dedicated reconstructions through the lumbar spine were also generated for interpretation purposes. RADIATION DOSE REDUCTION: This exam was performed according to the departmental dose-optimization program which  includes automated exposure control, adjustment of the mA and/or kV according to patient size and/or use of iterative reconstruction technique. COMPARISON:  CT of the chest, abdomen and pelvis 01/04/2022. FINDINGS: Lower chest: Status post mitral valve replacement. Atherosclerotic calcifications in the descending thoracic aorta as well as the right coronary artery. Hepatobiliary: No definite suspicious cystic or solid hepatic lesions are confidently identified on today's noncontrast CT examination. 3 mm calcified gallstone lying dependently in the gallbladder. Gallbladder does not appear distended. No pericholecystic fluid or surrounding inflammatory changes. Pancreas: No  pancreatic mass or peripancreatic fluid collections or inflammatory changes are noted on today's noncontrast CT examination. Spleen: Spleen is enlarged and heterogeneous in appearance. Previously demonstrated splenic mass is poorly evaluated on today's noncontrast CT examination, but the overall size and appearance of the spleen is very similar to the prior study, in addition to numerous prior examinations, presumably a benign lesion such as a splenic hamartoma. Adrenals/Urinary Tract: Status post left nephrectomy. No unexpected soft tissue mass noted in the nephrectomy bed to suggest local recurrence of disease. Left adrenal gland is unremarkable in appearance. Unenhanced appearance of the right kidney and right adrenal gland is normal in appearance. No right hydroureteronephrosis. Urinary bladder is largely decompressed, but otherwise unremarkable in appearance. Stomach/Bowel: Unenhanced appearance of the stomach is unremarkable. No pathologic dilatation of small bowel or colon. Postoperative changes of prior left hemicolectomy are noted. No unexpected soft tissue mass at the suture line to suggest locally recurrent disease noted on today's noncontrast CT examination. Vascular/Lymphatic: Atherosclerosis in the abdominal aorta and pelvic vasculature. No definite lymphadenopathy confidently identified in the abdomen or pelvis on today's noncontrast CT examination. Reproductive: Prostate gland and seminal vesicles are unremarkable in appearance. Other: No significant volume of ascites noted in the visualized portions of the peritoneal cavity. Musculoskeletal: Numerous mixed lytic and sclerotic lesions are scattered throughout the visualized axial and appendicular skeleton, including several areas in the lumbar spine. This includes near complete infiltration of the L3 vertebral body, however, all of the vertebral bodies in the lumbar spine have a generalized moth-eaten appearance. The largest extra-spinal lesion on  today's examination is in the right ilium (axial image 51 of series 2) measuring 3.0 x 2.0 cm. Focal area of sclerosis also noted in the inferior aspect of the sternum (axial image 1 of series 2. Dextroscoliosis of the lumbar spine. IMPRESSION: 1. 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 2. Status post left hemicolectomy and left nephrectomy. No extra skeletal metastatic disease confidently identified in the abdomen or pelvis on today's noncontrast CT examination. 3. Stable appearance of the spleen, poorly evaluated on today's noncontrast CT examination, but favored to represent a benign lesions such as a large splenic hamartoma. 4. Cholelithiasis without evidence of acute cholecystitis. 5. Aortic atherosclerosis, in addition to at least right coronary artery disease. Please note that although the presence of coronary artery calcium documents the presence of coronary artery disease, the severity of this disease and any potential stenosis cannot be assessed on this non-gated CT examination. Assessment for potential risk factor modification, dietary therapy or pharmacologic therapy may be warranted, if clinically indicated. 6. Additional incidental findings, as above. Electronically Signed   By: Vinnie Langton M.D.   On: 04/13/2022 12:59   CT Lumbar Spine Wo Contrast  Result Date: 04/13/2022 CLINICAL DATA:  57 year old male with history of colon cancer. Evaluate for metastatic disease. * Tracking Code: BO * EXAM: CT ABDOMEN AND PELVIS WITHOUT CONTRAST CT LUMBAR SPINE WITHOUT CONTRAST TECHNIQUE: Multidetector  CT imaging of the abdomen and pelvis was performed following the standard protocol without IV contrast. Dedicated reconstructions through the lumbar spine were also generated for interpretation purposes. RADIATION DOSE REDUCTION: This exam was performed according to the departmental dose-optimization program which includes automated exposure control, adjustment of the mA and/or kV according to  patient size and/or use of iterative reconstruction technique. COMPARISON:  CT of the chest, abdomen and pelvis 01/04/2022. FINDINGS: Lower chest: Status post mitral valve replacement. Atherosclerotic calcifications in the descending thoracic aorta as well as the right coronary artery. Hepatobiliary: No definite suspicious cystic or solid hepatic lesions are confidently identified on today's noncontrast CT examination. 3 mm calcified gallstone lying dependently in the gallbladder. Gallbladder does not appear distended. No pericholecystic fluid or surrounding inflammatory changes. Pancreas: No pancreatic mass or peripancreatic fluid collections or inflammatory changes are noted on today's noncontrast CT examination. Spleen: Spleen is enlarged and heterogeneous in appearance. Previously demonstrated splenic mass is poorly evaluated on today's noncontrast CT examination, but the overall size and appearance of the spleen is very similar to the prior study, in addition to numerous prior examinations, presumably a benign lesion such as a splenic hamartoma. Adrenals/Urinary Tract: Status post left nephrectomy. No unexpected soft tissue mass noted in the nephrectomy bed to suggest local recurrence of disease. Left adrenal gland is unremarkable in appearance. Unenhanced appearance of the right kidney and right adrenal gland is normal in appearance. No right hydroureteronephrosis. Urinary bladder is largely decompressed, but otherwise unremarkable in appearance. Stomach/Bowel: Unenhanced appearance of the stomach is unremarkable. No pathologic dilatation of small bowel or colon. Postoperative changes of prior left hemicolectomy are noted. No unexpected soft tissue mass at the suture line to suggest locally recurrent disease noted on today's noncontrast CT examination. Vascular/Lymphatic: Atherosclerosis in the abdominal aorta and pelvic vasculature. No definite lymphadenopathy confidently identified in the abdomen or pelvis  on today's noncontrast CT examination. Reproductive: Prostate gland and seminal vesicles are unremarkable in appearance. Other: No significant volume of ascites noted in the visualized portions of the peritoneal cavity. Musculoskeletal: Numerous mixed lytic and sclerotic lesions are scattered throughout the visualized axial and appendicular skeleton, including several areas in the lumbar spine. This includes near complete infiltration of the L3 vertebral body, however, all of the vertebral bodies in the lumbar spine have a generalized moth-eaten appearance. The largest extra-spinal lesion on today's examination is in the right ilium (axial image 51 of series 2) measuring 3.0 x 2.0 cm. Focal area of sclerosis also noted in the inferior aspect of the sternum (axial image 1 of series 2. Dextroscoliosis of the lumbar spine. IMPRESSION: 1. 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 2. Status post left hemicolectomy and left nephrectomy. No extra skeletal metastatic disease confidently identified in the abdomen or pelvis on today's noncontrast CT examination. 3. Stable appearance of the spleen, poorly evaluated on today's noncontrast CT examination, but favored to represent a benign lesions such as a large splenic hamartoma. 4. Cholelithiasis without evidence of acute cholecystitis. 5. Aortic atherosclerosis, in addition to at least right coronary artery disease. Please note that although the presence of coronary artery calcium documents the presence of coronary artery disease, the severity of this disease and any potential stenosis cannot be assessed on this non-gated CT examination. Assessment for potential risk factor modification, dietary therapy or pharmacologic therapy may be warranted, if clinically indicated. 6. Additional incidental findings, as above. Electronically Signed   By: Vinnie Langton M.D.   On: 04/13/2022 12:59  XR Pelvis 1-2 Views  Result Date: 04/11/2022 AP pelvis radiographs  reviewed.  Minimal degenerative arthritis in either hip.  No acute fracture.  No destructive bony lesions present within the bony pelvis or proximal femurs.  XR Lumbar Spine 2-3 Views  Result Date: 04/11/2022 AP lateral radiographs lumbar spine reviewed.  Significant scoliosis is present.  Spondylolisthesis present in the middle lumbar spine region.  Degenerative compression is noted in the L2 vertebral body.  No destructive bony processes within the vertebral body.   Labs:  CBC: Recent Labs    08/16/21 0302 12/29/21 1216 04/14/22 1202 04/21/22 0951  WBC 7.2 5.7 6.6 4.8  HGB 13.7 15.4 15.0 14.7  HCT 39.5 45.9 44.4 44.1  PLT 143* 153 206 168    COAGS: Recent Labs    08/13/21 1235  INR 1.1  APTT 27    BMP: Recent Labs    08/16/21 0302 12/29/21 1216 01/24/22 0739 04/14/22 1202 04/18/22 1047  NA 137 137 137 136 139  K 4.5 4.8 4.6 4.2 4.3  CL 104 105 100 99 100  CO2 '26 25 28 27 23  '$ GLUCOSE 169* 180* 128* 212* 220*  BUN '17 18 20 19 18  '$ CALCIUM 8.6* 9.2 9.4 9.4 9.6  CREATININE 0.95 0.99 1.11 0.94 1.04  GFRNONAA >60 >60 >60 >60  --     LIVER FUNCTION TESTS: Recent Labs    08/13/21 1235 08/14/21 0536 12/29/21 1216 04/14/22 1202  BILITOT 1.9* 1.4* 1.1 0.6  AST '30 28 27 19  '$ ALT '30 26 29 19  '$ ALKPHOS 45 39 43 175*  PROT 7.2 6.0* 7.1 7.6  ALBUMIN 3.7 2.9* 3.7 4.1    TUMOR MARKERS: Recent Labs    07/19/21 0759 01/24/22 0739  CEA 1.15 1.74    Assessment and Plan: 57 yo male with history of colon and prostate cancer presents to IR for right iliac bone lesion biopsy.   Pt resting in bed with wife at bedside.  He is A&O, calm and pleasant.  He is in no distress.  Prophylactic abx ordered d/t hx of mitral valve repair.  Today's labs WNL.  Risks and benefits of biopsy of right iliac bone lesion with moderate sedation was discussed with the patient and/or patient's family including, but not limited to bleeding, infection, damage to adjacent structures or low  yield requiring additional tests.  All of the questions were answered and there is agreement to proceed.  Consent signed and in chart.  Thank you for this interesting consult.  I greatly enjoyed meeting [name] and look forward to participating in their care.  A copy of this report was sent to the requesting provider on this date.  Electronically Signed: Tyson Alias, NP 04/21/2022, 10:05 AM   I spent a total of 20 minutes  in face to face in clinical consultation, greater than 50% of which was counseling/coordinating care for right iliac bone lesion.

## 2022-04-19 LAB — BASIC METABOLIC PANEL
BUN/Creatinine Ratio: 17 (ref 9–20)
BUN: 18 mg/dL (ref 6–24)
CO2: 23 mmol/L (ref 20–29)
Calcium: 9.6 mg/dL (ref 8.7–10.2)
Chloride: 100 mmol/L (ref 96–106)
Creatinine, Ser: 1.04 mg/dL (ref 0.76–1.27)
Glucose: 220 mg/dL — ABNORMAL HIGH (ref 70–99)
Potassium: 4.3 mmol/L (ref 3.5–5.2)
Sodium: 139 mmol/L (ref 134–144)
eGFR: 85 mL/min/{1.73_m2} (ref >59)

## 2022-04-19 LAB — PRO B NATRIURETIC PEPTIDE: NT-Pro BNP: 291 pg/mL — ABNORMAL HIGH (ref 0–121)

## 2022-04-21 ENCOUNTER — Encounter: Payer: Self-pay | Admitting: Nurse Practitioner

## 2022-04-21 ENCOUNTER — Ambulatory Visit
Admission: RE | Admit: 2022-04-21 | Discharge: 2022-04-21 | Disposition: A | Discharge status: Home / Self Care | Payer: BC Managed Care – PPO | Source: Ambulatory Visit | Attending: Nurse Practitioner | Admitting: Nurse Practitioner

## 2022-04-21 DIAGNOSIS — C785 Secondary malignant neoplasm of large intestine and rectum: Secondary | ICD-10-CM | POA: Diagnosis not present

## 2022-04-21 DIAGNOSIS — C186 Malignant neoplasm of descending colon: Secondary | ICD-10-CM | POA: Insufficient documentation

## 2022-04-21 DIAGNOSIS — M899 Disorder of bone, unspecified: Secondary | ICD-10-CM

## 2022-04-21 DIAGNOSIS — C7951 Secondary malignant neoplasm of bone: Secondary | ICD-10-CM | POA: Diagnosis not present

## 2022-04-21 DIAGNOSIS — M898X8 Other specified disorders of bone, other site: Secondary | ICD-10-CM | POA: Diagnosis not present

## 2022-04-21 DIAGNOSIS — C61 Malignant neoplasm of prostate: Secondary | ICD-10-CM | POA: Insufficient documentation

## 2022-04-21 LAB — CBC WITH DIFFERENTIAL/PLATELET
Abs Immature Granulocytes: 0.03 10*3/uL (ref 0.00–0.07)
Basophils Absolute: 0 10*3/uL (ref 0.0–0.1)
Basophils Relative: 1 %
Eosinophils Absolute: 0.2 10*3/uL (ref 0.0–0.5)
Eosinophils Relative: 5 %
HCT: 44.1 % (ref 39.0–52.0)
Hemoglobin: 14.7 g/dL (ref 13.0–17.0)
Immature Granulocytes: 1 %
Lymphocytes Relative: 21 %
Lymphs Abs: 1 10*3/uL (ref 0.7–4.0)
MCH: 30.9 pg (ref 26.0–34.0)
MCHC: 33.3 g/dL (ref 30.0–36.0)
MCV: 92.8 fL (ref 80.0–100.0)
Monocytes Absolute: 0.7 10*3/uL (ref 0.1–1.0)
Monocytes Relative: 15 %
Neutro Abs: 2.8 10*3/uL (ref 1.7–7.7)
Neutrophils Relative %: 57 %
Platelets: 168 10*3/uL (ref 150–400)
RBC: 4.75 MIL/uL (ref 4.22–5.81)
RDW: 12.7 % (ref 11.5–15.5)
WBC: 4.8 10*3/uL (ref 4.0–10.5)
nRBC: 0 % (ref 0.0–0.2)

## 2022-04-21 LAB — GLUCOSE, CAPILLARY: Glucose-Capillary: 147 mg/dL — ABNORMAL HIGH (ref 70–99)

## 2022-04-21 MED ORDER — LIDOCAINE HCL (PF) 1 % IJ SOLN: 10.0000 mL | Freq: Once | INTRAMUSCULAR | Status: DC

## 2022-04-21 MED ORDER — MIDAZOLAM HCL 2 MG/2ML IJ SOLN
INTRAMUSCULAR | Status: AC | PRN
  Administered 2022-04-21: 1 mg via INTRAVENOUS

## 2022-04-21 MED ORDER — CEFAZOLIN SODIUM-DEXTROSE 2-4 GM/100ML-% IV SOLN
INTRAVENOUS | Status: AC
  Administered 2022-04-21: 2 g via INTRAVENOUS
  Filled 2022-04-21: qty 100

## 2022-04-21 MED ORDER — FENTANYL CITRATE (PF) 100 MCG/2ML IJ SOLN
INTRAMUSCULAR | Status: AC
  Filled 2022-04-21: qty 2

## 2022-04-21 MED ORDER — HEPARIN SOD (PORK) LOCK FLUSH 100 UNIT/ML IV SOLN
INTRAVENOUS | Status: AC
  Filled 2022-04-21: qty 5

## 2022-04-21 MED ORDER — FENTANYL CITRATE (PF) 100 MCG/2ML IJ SOLN
INTRAMUSCULAR | Status: AC | PRN
  Administered 2022-04-21: 50 ug via INTRAVENOUS

## 2022-04-21 MED ORDER — CEFAZOLIN SODIUM-DEXTROSE 2-4 GM/100ML-% IV SOLN
2.0000 g | INTRAVENOUS | Status: AC
  Filled 2022-04-21: qty 100

## 2022-04-21 MED ORDER — MIDAZOLAM HCL 2 MG/2ML IJ SOLN
INTRAMUSCULAR | Status: AC
  Filled 2022-04-21: qty 4

## 2022-04-21 MED ORDER — SODIUM CHLORIDE 0.9 % IV SOLN: INTRAVENOUS | Status: DC

## 2022-04-21 NOTE — Procedures (Signed)
Interventional Radiology Procedure Note  Procedure: CT guided biopsy of right anterior iliac sclerotic lesion, avid on bone scan and concerning for met.  Findings: core bx acquired  Complications: None EBL: None  Recommendations: - Bedrest 1 hours.   - Routine wound care - Follow up pathology - Advance diet  - ok to restart all home meds on schedule  Signed,  Corrie Mckusick, DO

## 2022-04-22 ENCOUNTER — Encounter: Payer: Self-pay | Admitting: *Deleted

## 2022-04-22 DIAGNOSIS — C61 Malignant neoplasm of prostate: Secondary | ICD-10-CM | POA: Diagnosis not present

## 2022-04-22 DIAGNOSIS — N500 Atrophy of testis: Secondary | ICD-10-CM | POA: Diagnosis not present

## 2022-04-22 DIAGNOSIS — Z905 Acquired absence of kidney: Secondary | ICD-10-CM | POA: Diagnosis not present

## 2022-04-22 DIAGNOSIS — D49512 Neoplasm of unspecified behavior of left kidney: Secondary | ICD-10-CM | POA: Diagnosis not present

## 2022-04-24 ENCOUNTER — Inpatient Hospital Stay: Payer: BC Managed Care – PPO | Admitting: Oncology

## 2022-04-25 ENCOUNTER — Ambulatory Visit (HOSPITAL_COMMUNITY): Payer: BC Managed Care – PPO | Attending: Cardiovascular Disease

## 2022-04-25 DIAGNOSIS — R0609 Other forms of dyspnea: Secondary | ICD-10-CM | POA: Diagnosis not present

## 2022-04-25 DIAGNOSIS — Z9889 Other specified postprocedural states: Secondary | ICD-10-CM

## 2022-04-25 LAB — ECHOCARDIOGRAM COMPLETE
Area-P 1/2: 3.62 cm2
MV VTI: 1.52 cm2
S' Lateral: 2.5 cm

## 2022-04-25 LAB — SURGICAL PATHOLOGY

## 2022-04-28 ENCOUNTER — Encounter: Payer: Self-pay | Admitting: Nurse Practitioner

## 2022-04-28 ENCOUNTER — Encounter: Payer: Self-pay | Admitting: *Deleted

## 2022-04-28 ENCOUNTER — Inpatient Hospital Stay: Payer: BC Managed Care – PPO | Admitting: Nurse Practitioner

## 2022-04-28 VITALS — BP 141/87 | HR 88 | Temp 98.1°F | Resp 18 | Ht 72.0 in | Wt 218.2 lb

## 2022-04-28 DIAGNOSIS — Z85038 Personal history of other malignant neoplasm of large intestine: Secondary | ICD-10-CM | POA: Diagnosis not present

## 2022-04-28 DIAGNOSIS — Z8546 Personal history of malignant neoplasm of prostate: Secondary | ICD-10-CM | POA: Diagnosis not present

## 2022-04-28 DIAGNOSIS — R918 Other nonspecific abnormal finding of lung field: Secondary | ICD-10-CM | POA: Diagnosis not present

## 2022-04-28 DIAGNOSIS — C7951 Secondary malignant neoplasm of bone: Secondary | ICD-10-CM | POA: Diagnosis not present

## 2022-04-28 DIAGNOSIS — C186 Malignant neoplasm of descending colon: Secondary | ICD-10-CM | POA: Diagnosis not present

## 2022-04-28 NOTE — Progress Notes (Signed)
[place] OFFICE PROGRESS NOTE   Diagnosis: Colon cancer  INTERVAL HISTORY:   [name] returns as scheduled.  No change in back pain.  He has intermittent pain at the right shoulder.  He is fatigued.  Objective:  Vital signs in last 24 hours:  Blood pressure (!) 141/87, pulse 88, temperature 98.1 F (36.7 C), temperature source Oral, resp. rate 18, height 6' (1.829 m), weight 218 lb 3.2 oz (99 kg), SpO2 99 %.    Resp: Lungs clear bilaterally. Cardio: Regular rate and rhythm. GI: Abdomen soft and nontender.  No hepatosplenomegaly. Vascular: Left lower leg is slightly larger than the right lower leg. Musculoskeletal: Nontender over the back.   Lab Results:  Lab Results  Component Value Date   WBC 4.8 04/21/2022   HGB 14.7 04/21/2022   HCT 44.1 04/21/2022   MCV 92.8 04/21/2022   PLT 168 04/21/2022   NEUTROABS 2.8 04/21/2022    Imaging:  No results found.  Medications: I have reviewed the patient's current medications.  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, status post a left colectomy 03/04/2019 Mass felt to be in the transverse colon on a colonoscopy 01/07/2019 with a biopsy confirming poorly differentiated adenocarcinoma with signet ring cell features, mismatch repair protein expression normal CT abdomen/pelvis 01/08/2019 12.3 cm indeterminate splenic mass, present in 2015 on a lumbar MRI-felt to be benign, small subpleural and pleural-based nodules at the lung bases, status post left nephrectomy CT chest 01/20/2019-small bilateral pulmonary nodules with rounded parenchymal nodules in the right lower lobe, indeterminate splenic mass CT chest 03/18/2019-multiple subcentimeter pulmonary nodules again seen bilaterally, greatest in the lower lobes, without significant change.  No new or enlarging pulmonary nodules or masses.  Partially  visualized large heterogeneous splenic mass with no significant change.  Plan for follow-up chest CT at a 55-monthinterval. Cycle 1 FOLFOX 03/21/2019 Cycle 2 FOLFOX 04/04/2019 Cycle 3 FOLFOX 04/18/2019 (oxaliplatin dose reduced secondary to thrombocytopenia) Cycle 4 FOLFOX 05/02/2019 (oxaliplatin held secondary to thrombocytopenia) Cycle 5 FOLFOX 05/16/2019 Cycle 6 FOLFOX 05/30/2019 (oxaliplatin held secondary to thrombocytopenia) CT chest 06/08/2019-stable scattered small solid pulmonary nodules  Cycle 7 FOLFOX 06/13/2019 Cycle 8 FOLFOX 06/27/2019 Cycle 9 FOLFOX 07/11/2019 Cycle 10 FOLFOX 07/25/2019 (oxaliplatin held due to thrombocytopenia) Cycle 11 FOLFOX 08/08/2019 Cycle 12 FOLFOX 08/22/2019 CTs 01/09/2020-no evidence of recurrent disease, stable lung nodules favored to represent subpleural lymph nodes-dedicated follow-up not recommended CTs 01/11/2021-no evidence of recurrent disease CTs 01/24/2022-no evidence of recurrent disease CT lumbar spine, abdomen/pelvis 04/11/2022-numerous mixed lytic and sclerotic lesions scattered throughout the visualized axial and appendicular skeleton including several areas in the lumbar spine.   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  Left nephrectomy at age 57 12  Port-A-Cath placement, Dr. GJohney Maine 03/16/2019 13.  Thrombocytopenia secondary to  chemotherapy-oxaliplatin dose reduced beginning with cycle 3 FOLFOX, improved 14.  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  Prostate cancer 04/04/2021-Gleason  6 adenocarcinoma involving 10% of 1 core biopsy 17.  Right groin soft tissue infection May 2023-status postsurgical debridement 18.  Left leg DVT-common femoral, femoral, popliteal, posterior tibial, and peroneal veins 19.  02/04/2022-MRI cervical spine-1.7 cm T1 lesion-indeterminate; bone scan 03/14/2022-focal intense activity at approximate T1 level felt to correspond to the lesion on MRI, additional foci of activity involving the lower thoracic and lumbar spine, right iliac bone and pubic symphysis.  CT lumbar spine, abdomen/pelvis 04/11/2022-numerous mixed lytic and sclerotic lesions scattered throughout the visualized axial and appendicular skeleton including several areas in the lumbar spine.  04/21/2022 CT biopsy sclerotic lesion right anterior iliac bone-consistent with metastatic colorectal adenocarcinoma, moderate to poorly differentiated  Disposition: [name] appears unchanged.  Dr. Benay Spice had contacted him last week regarding the iliac bone biopsy result showing metastatic colon cancer.  We reviewed the result again today.  CT abdomen/pelvis report and images reviewed [name] and his wife.  They understand no therapy will be curative.  Dr. Benay Spice had preliminary discussion regarding systemic therapy with FOLFIRI.  We have requested foundation 1 testing to help guide treatment recommendations.  We are referring him for a baseline PET scan.  We are also referring him for Port-A-Cath placement.  He will return as scheduled on 05/16/2022.  Patient seen with Dr. Benay Spice.  Ned Card ANP/GNP-BC   04/28/2022  8:25 AM  This was a shared visit with Ned Card.  I discussed the pelvic biopsy findings with [name] on 04/25/2022 and again  today.  He has been diagnosed with metastatic colon cancer.  We discussed treatment options.  He will be referred for a staging PET scan to look for measurable disease.  We will submit the pelvic or colon tissue for NGS testing.  He will return for an office visit and further discussion in 2 weeks.  We discussed treatment with FOLFIRI plus biologic therapy.  We reviewed the abdomen/pelvis CT images.  I was present for greater than 50% of today's visit.  I performed medical decision making.  Julieanne Manson, MD

## 2022-04-28 NOTE — Progress Notes (Signed)
PATIENT NAVIGATOR PROGRESS NOTE  Name: [name] Date: 04/28/2022 MRN: [id number]  DOB: 01-26-1966   Reason for visit:  F/U after biopsy  Comments:  [facility] testing requested from 04/21/22 biopsy of Right iliac Accession number [id number].  Orders entered for [facility] placement Orders entered for PET scan Working on coordination of care due to pt work schedule.  Will continue to monitor for scheduling needs    Time spent counseling/coordinating care: 45-60 minutes

## 2022-04-30 ENCOUNTER — Encounter: Payer: Self-pay | Admitting: *Deleted

## 2022-04-30 NOTE — Progress Notes (Signed)
Fax from [facility] that tissue has been received.

## 2022-05-05 ENCOUNTER — Encounter: Payer: Self-pay | Admitting: *Deleted

## 2022-05-05 NOTE — Progress Notes (Signed)
Received fax from [facility] that tissue is being retrieved for testing.

## 2022-05-06 ENCOUNTER — Encounter: Payer: Self-pay | Admitting: Oncology

## 2022-05-06 DIAGNOSIS — C186 Malignant neoplasm of descending colon: Secondary | ICD-10-CM | POA: Diagnosis not present

## 2022-05-07 ENCOUNTER — Other Ambulatory Visit: Payer: Self-pay | Admitting: Radiology

## 2022-05-08 ENCOUNTER — Encounter (HOSPITAL_COMMUNITY): Payer: Self-pay

## 2022-05-08 ENCOUNTER — Ambulatory Visit (HOSPITAL_COMMUNITY)
Admission: RE | Admit: 2022-05-08 | Discharge: 2022-05-08 | Disposition: A | Discharge status: Home / Self Care | Payer: BC Managed Care – PPO | Source: Ambulatory Visit | Attending: Nurse Practitioner | Admitting: Nurse Practitioner

## 2022-05-08 ENCOUNTER — Encounter (HOSPITAL_COMMUNITY): Payer: Self-pay | Admitting: *Deleted

## 2022-05-08 ENCOUNTER — Ambulatory Visit: Payer: BC Managed Care – PPO | Admitting: Orthopaedic Surgery

## 2022-05-08 DIAGNOSIS — Z794 Long term (current) use of insulin: Secondary | ICD-10-CM | POA: Insufficient documentation

## 2022-05-08 DIAGNOSIS — Z452 Encounter for adjustment and management of vascular access device: Secondary | ICD-10-CM | POA: Diagnosis not present

## 2022-05-08 DIAGNOSIS — C189 Malignant neoplasm of colon, unspecified: Secondary | ICD-10-CM | POA: Diagnosis not present

## 2022-05-08 DIAGNOSIS — Z7984 Long term (current) use of oral hypoglycemic drugs: Secondary | ICD-10-CM | POA: Insufficient documentation

## 2022-05-08 DIAGNOSIS — E119 Type 2 diabetes mellitus without complications: Secondary | ICD-10-CM | POA: Insufficient documentation

## 2022-05-08 DIAGNOSIS — C186 Malignant neoplasm of descending colon: Secondary | ICD-10-CM | POA: Diagnosis not present

## 2022-05-08 DIAGNOSIS — Z79899 Other long term (current) drug therapy: Secondary | ICD-10-CM | POA: Diagnosis not present

## 2022-05-08 DIAGNOSIS — Z7982 Long term (current) use of aspirin: Secondary | ICD-10-CM | POA: Insufficient documentation

## 2022-05-08 DIAGNOSIS — I10 Essential (primary) hypertension: Secondary | ICD-10-CM | POA: Diagnosis not present

## 2022-05-08 HISTORY — PX: IR IMAGING GUIDED PORT INSERTION: IMG5740

## 2022-05-08 LAB — GLUCOSE, CAPILLARY: Glucose-Capillary: 143 mg/dL — ABNORMAL HIGH (ref 70–99)

## 2022-05-08 MED ORDER — CEFAZOLIN SODIUM-DEXTROSE 2-4 GM/100ML-% IV SOLN: 2.0000 g | Freq: Once | INTRAVENOUS | Status: AC

## 2022-05-08 MED ORDER — FENTANYL CITRATE (PF) 100 MCG/2ML IJ SOLN
INTRAMUSCULAR | Status: AC
  Filled 2022-05-08: qty 2

## 2022-05-08 MED ORDER — SODIUM CHLORIDE 0.9 % IV SOLN: INTRAVENOUS | Status: DC

## 2022-05-08 MED ORDER — FENTANYL CITRATE (PF) 100 MCG/2ML IJ SOLN
INTRAMUSCULAR | Status: AC | PRN
  Administered 2022-05-08 (×2): 50 ug via INTRAVENOUS

## 2022-05-08 MED ORDER — HEPARIN SOD (PORK) LOCK FLUSH 100 UNIT/ML IV SOLN
INTRAVENOUS | Status: AC
  Filled 2022-05-08: qty 5

## 2022-05-08 MED ORDER — MIDAZOLAM HCL 2 MG/2ML IJ SOLN
INTRAMUSCULAR | Status: AC
  Filled 2022-05-08: qty 4

## 2022-05-08 MED ORDER — CEFAZOLIN SODIUM-DEXTROSE 2-4 GM/100ML-% IV SOLN
INTRAVENOUS | Status: AC
  Administered 2022-05-08: 2 g via INTRAVENOUS
  Filled 2022-05-08: qty 100

## 2022-05-08 MED ORDER — LIDOCAINE-EPINEPHRINE 1 %-1:100000 IJ SOLN
INTRAMUSCULAR | Status: AC
  Filled 2022-05-08: qty 1

## 2022-05-08 MED ORDER — MIDAZOLAM HCL 2 MG/2ML IJ SOLN
INTRAMUSCULAR | Status: AC | PRN
  Administered 2022-05-08 (×3): 1 mg via INTRAVENOUS

## 2022-05-08 NOTE — Discharge Instructions (Signed)
For questions /concerns may call Interventional Radiology at [phone] or  Interventional Radiology clinic [phone]   You may remove your dressing and shower tomorrow afternoon  DO NOT use EMLA cream for 2 weeks after port placement as the cream will remove surgical glue on your incision.    Implanted Port Insertion, Care After This sheet gives you information about how to care for yourself after your procedure. Your health care provider may also give you more specific instructions. If you have problems or questions, contact your health careprovider. What can I expect after the procedure? After the procedure, it is common to have: Discomfort at the port insertion site. Bruising on the skin over the port. This should improve over 3-4 days. Follow these instructions at home: [facility] care After your port is placed, you will get a manufacturer's information card. The card has information about your port. Keep this card with you at all times. Take care of the port as told by your health care provider. Ask your health care provider if you or a family member can get training for taking care of the port at home. A home health care nurse may also take care of the port. Make sure to remember what type of port you have. Incision care Follow instructions from your health care provider about how to take care of your port insertion site. Make sure you: Wash your hands with soap and water before and after you change your bandage (dressing). If soap and water are not available, use hand sanitizer. Change your dressing as told by your health care provider. Leave skin glue, or adhesive strips in place. These skin closures may need to stay in place for 2 weeks or longer.  Check your port insertion site every day for signs of infection. Check for:      - Redness, swelling, or pain.                     - Fluid or blood.      - Warmth.      - Pus or a bad smell. Activity Return to your normal activities  as told by your health care provider. Ask your health care provider what activities are safe for you. Do not lift anything that is heavier than 10 lb (4.5 kg), or the limit that you are told, until your health care provider says that it is safe. General instructions Take over-the-counter and prescription medicines only as told by your health care provider. Do not take baths, swim, or use a hot tub until your health care provider approves. Ask your health care provider if you may take showers. You may only be allowed to take sponge baths. Do not drive for 24 hours if you were given a sedative during your procedure. Wear a medical alert bracelet in case of an emergency. This will tell any health care providers that you have a port. Keep all follow-up visits as told by your health care provider. This is important. Contact a health care provider if: You cannot flush your port with saline as directed, or you cannot draw blood from the port. You have a fever or chills. You have redness, swelling, or pain around your port insertion site. You have fluid or blood coming from your port insertion site. Your port insertion site feels warm to the touch. You have pus or a bad smell coming from the port insertion site. Get help right away if: You have chest pain or shortness of  breath. You have bleeding from your port that you cannot control. Summary Take care of the port as told by your health care provider. Keep the manufacturer's information card with you at all times. Change your dressing as told by your health care provider. Contact a health care provider if you have a fever or chills or if you have redness, swelling, or pain around your port insertion site. Keep all follow-up visits as told by your health care provider. This information is not intended to replace advice given to you by your health care provider. Make sure you discuss any questions you have with your healthcare provider.   Moderate  Conscious Sedation, Adult, Care After This sheet gives you information about how to care for yourself after your procedure. Your health care provider may also give you more specific instructions. If you have problems or questions, contact your health careprovider. What can I expect after the procedure? After the procedure, it is common to have: Sleepiness for several hours. Impaired judgment for several hours. Difficulty with balance. Vomiting if you eat too soon. Follow these instructions at home: For the time period you were told by your health care provider: Rest. Do not participate in activities where you could fall or become injured. Do not drive or use machinery. Do not drink alcohol. Do not take sleeping pills or medicines that cause drowsiness. Do not make important decisions or sign legal documents. Do not take care of children on your own. Eating and drinking  Follow the diet recommended by your health care provider. Drink enough fluid to keep your urine pale yellow. If you vomit: Drink water, juice, or soup when you can drink without vomiting. Make sure you have little or no nausea before eating solid foods.  General instructions Take over-the-counter and prescription medicines only as told by your health care provider. Have a responsible adult stay with you for the time you are told. It is important to have someone help care for you until you are awake and alert. Do not smoke. Keep all follow-up visits as told by your health care provider. This is important. Contact a health care provider if: You are still sleepy or having trouble with balance after 24 hours. You feel light-headed. You keep feeling nauseous or you keep vomiting. You develop a rash. You have a fever. You have redness or swelling around the IV site. Get help right away if: You have trouble breathing. You have new-onset confusion at home. Summary After the procedure, it is common to feel sleepy, have  impaired judgment, or feel nauseous if you eat too soon. Rest after you get home. Know the things you should not do after the procedure. Follow the diet recommended by your health care provider and drink enough fluid to keep your urine pale yellow. Get help right away if you have trouble breathing or new-onset confusion at home. This information is not intended to replace advice given to you by your health care provider. Make sure you discuss any questions you have with your healthcare provider. Document Revised: 07/15/2019 Document Reviewed: 02/10/2019 Elsevier Patient Education  2022 [employer].

## 2022-05-08 NOTE — H&P (Signed)
Chief Complaint: Patient was seen in consultation today for colon cancer  Referring Physician(s): Owens Shark  Supervising Physician: Corrie Mckusick  Patient Status: [facility] - Out-pt  History of Present Illness: [name] is a 57 y.o. male with past medical history of asthma, DM, solitary kidney, s/p mitral valve repair requiring complex valculoplasty complicated by sepsis who presents to [facility]iology after recent diagnosis of colon cancer recurrence.  He is referred to IR today for Port-A-Cath placement.  He is familiar with Port placement and use from prior surgical Port placed 03/16/19 by Dr. Johney Maine. He is understanding of the risks and benefits of the procedure and is agreeable to proceed.   He has been NPO.  He does take Pradaxa and aspirin and held both these medications prior to the procedure today.  He reports typical use of prophylactic antibiotic due to prior valve replacement/repair with complications. He is otherwise in his usual state of health today.  His wife is available for post procedure care and transportation today.   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   History of hiatal hernia    Hyperlipidemia    Hypertension    Diagnostic exercise tolerance test assessment:04/30/2010 : comments normal -no evidence os ischemia by ST analysis   lipoma    Lipoma 01/26/2019   Mitral regurgitation    Echo 12/2018: EF 60-65, myxomatous mitral valve, severe mitral regurgitation, partial flail leaflet of  posterior mitral valve, myxomatous tricuspid valve with trivial TR, ascending aorta mildly dilated (39 mm)   Pneumonia    as child   PONV (postoperative nausea and vomiting)    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   Complex valvuloplasty including artificial Gore-tex neochord placement x6 with 28 mm Sorin Memo 4D ring annuloplasty via right mini thoracotomy approach   Solitary kidney    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    Past Surgical History:  Procedure Laterality Date   ABDOMINAL AORTOGRAM N/A 02/10/2020   Procedure: ABDOMINAL AORTOGRAM;  Surgeon: Jettie Booze, MD;  Location: [place] [facility];  Service: Cardiovascular;  Laterality: N/A;   APPLICATION OF WOUND VAC  08/15/2021   Procedure: APPLICATION OF WOUND VAC;  Surgeon: Rolm Bookbinder, MD;  Location: [place];  Service: General;;   BIOPSY  01/07/2019   Procedure: BIOPSY;  Surgeon: Wilford Corner, MD;  Location: [facility];  Service: Endoscopy;;   BIOPSY  01/03/2020   Procedure: BIOPSY;  Surgeon: Wilford Corner, MD;  [facility];  Service: Endoscopy;;   COLON SURGERY  03/04/2019   left colon segmental resection   COLONOSCOPY WITH PROPOFOL N/A 01/07/2019   Procedure: COLONOSCOPY WITH PROPOFOL;  Surgeon: Wilford Corner, MD;  [facility];  Service: Endoscopy;  Laterality: N/A;   COLONOSCOPY WITH PROPOFOL N/A 01/03/2020   Procedure: COLONOSCOPY WITH PROPOFOL;  Surgeon: Wilford Corner, MD;  [facility];  Service: Endoscopy;  Laterality: N/A;   INCISION AND DRAINAGE OF WOUND Right 08/13/2021   Procedure: IRRIGATION AND DEBRIDEMENT WOUND RIGHT GROIN;  Surgeon: Ralene Ok, MD;  Location: [place];  Service: General;  Laterality: Right;   INGUINAL HERNIA REPAIR Left 04/23/2016   Procedure: LAPAROSCOPIC  INGUINAL REPAIR;  Surgeon: Clovis Riley, MD;  Location: [place];  Service: General;  Laterality: Left;   IRRIGATION AND DEBRIDEMENT  ABSCESS Right 08/15/2021   Procedure: IRRIGATION AND DEBRIDEMENT OF GROIN;  Surgeon: Rolm Bookbinder, MD;  Location: [place];  Service: General;  Laterality: Right;   KNEE SURGERY     arthroscopic left knee   LIPOMA EXCISION  2015   x20 [facility]   MITRAL VALVE REPAIR Right 05/09/2020   Procedure: MINIMALLY INVASIVE MITRAL VALVE REPAIR (MVR) USING LIVANOVA MEMO 4D 28MM MITRAL RING;  Surgeon: [name], MD;  Location: [place];  Service: Open Heart Surgery;  Laterality: Right;   POLYPECTOMY  01/03/2020   Procedure: POLYPECTOMY;  Surgeon: Wilford Corner, MD;  [facility];  Service: Endoscopy;;   PORT-A-CATH REMOVAL     PORTACATH PLACEMENT N/A 03/16/2019   Procedure: INSERTION PORT-A-CATH;  Surgeon: Michael Boston, MD;  [facility];  Service: General;  Laterality: N/A;   RIGHT/LEFT HEART CATH AND CORONARY ANGIOGRAPHY N/A 02/10/2020   Procedure: RIGHT/LEFT HEART CATH AND CORONARY ANGIOGRAPHY;  Surgeon: Jettie Booze, MD;  Location: [place] [facility];  Service: Cardiovascular;  Laterality: N/A;   SUBMUCOSAL INJECTION  01/07/2019   Procedure: SUBMUCOSAL INJECTION;  Surgeon: Wilford Corner, MD;  [facility];  Service: Endoscopy;;   TEE WITHOUT CARDIOVERSION N/A 02/10/2020   Procedure: TRANSESOPHAGEAL ECHOCARDIOGRAM (TEE);  Surgeon: Acie Fredrickson Wonda Cheng, MD;  Location: [place];  Service: Cardiovascular;  Laterality: N/A;  CATH AFTER TEE   TEE WITHOUT CARDIOVERSION N/A 05/09/2020   Procedure: TRANSESOPHAGEAL ECHOCARDIOGRAM (TEE);  Surgeon: [name], MD;  Location: [place];  Service: Open Heart Surgery;  Laterality: N/A;   THYROIDECTOMY, PARTIAL  01/05/2009   Right Thyroid Lobectomy   TONSILLECTOMY     TOTAL NEPHRECTOMY Left 1970   Wilms Tumor left kidney excised as an infant    Allergies: Empagliflozin  Medications: Prior to Admission medications   Medication Sig Start Date End Date Taking? Authorizing Provider  acetaminophen (TYLENOL) 500 MG tablet  Take 1 tablet (500 mg total) by mouth daily as needed for mild pain. 12/29/21  Yes Redwine, Madison A, PA-C  albuterol (PROVENTIL HFA;VENTOLIN HFA) 108 (90 BASE) MCG/ACT inhaler Inhale 1-2 puffs into the lungs every 6 (six) hours as needed for wheezing or shortness of breath.   Yes [provider]  atorvastatin (LIPITOR) 10 MG tablet Take 1 tablet (10 mg total) by mouth daily. 03/15/14  Yes Angiulli, Lavon Paganini, PA-C  calcium carbonate (TUMS EX) 750 MG chewable tablet Chew 1,500 mg by mouth daily as needed for heartburn.   Yes [provider]  ibuprofen (ADVIL) 200 MG tablet Take 200 mg by mouth daily as needed for mild pain.   Yes [provider]  insulin glargine, 1 Unit Dial, (TOUJEO SOLOSTAR) 300 UNIT/ML Solostar Pen Inject 26 Units into the skin daily. 04/05/21  Yes [provider]  lisinopril (ZESTRIL) 10 MG tablet Take 1 tablet (10 mg total) by mouth daily. 04/04/22  Yes Jettie Booze, MD  loratadine (CLARITIN) 10 MG tablet Take 10 mg by mouth daily as needed for allergies.   Yes [provider]  metFORMIN (GLUCOPHAGE) 1000 MG tablet  Take 1 tablet (1,000 mg total) by mouth 2 (two) times daily. 02/12/20  Yes Jettie Booze, MD  methocarbamol (ROBAXIN) 500 MG tablet Take 1 tablet (500 mg total) by mouth 2 (two) times daily. 12/29/21  Yes Redwine, Madison A, PA-C  metoprolol succinate (TOPROL-XL) 25 MG 24 hr tablet Take 25 mg by mouth daily.   Yes [provider]  mometasone (ASMANEX) 220 MCG/INH inhaler Inhale 1 puff into the lungs daily.   Yes [provider]  Multiple Vitamin (MULTIVITAMIN ADULT PO) Take 1 tablet by mouth daily. 1st Phorm multivitamin.   Yes [provider]  Omega-3 Fatty Acids (FISH OIL PO) Take 1,000 mg by mouth 2 (two) times daily.   Yes [provider]  pioglitazone (ACTOS) 30 MG tablet Take 30 mg by mouth daily.   Yes [provider]  repaglinide (PRANDIN) 2 MG tablet Take 2 mg  by mouth 2 (two) times daily. 05/16/20  Yes [provider]  traMADol (ULTRAM) 50 MG tablet Take 1 tablet (50 mg total) by mouth every 6 (six) hours as needed. Patient not taking: Reported on 04/28/2022 04/08/22   Meredith Pel, MD  aspirin EC 81 MG EC tablet Take 1 tablet (81 mg total) by mouth daily. Swallow whole. Patient taking differently: Take 81 mg by mouth daily. 05/15/20   Antony Odea, PA-C  dabigatran (PRADAXA) 150 MG CAPS capsule Take 150 mg by mouth 2 (two) times daily.    [provider]  fluticasone (FLONASE) 50 MCG/ACT nasal spray Place 1 spray into both nostrils daily as needed for allergies or rhinitis. Patient not taking: Reported on 04/28/2022    [provider]  furosemide (LASIX) 20 MG tablet Take 1 tablet (20 mg total) by mouth as needed. Patient not taking: Reported on 04/28/2022 11/07/21   Marylu Lund., NP  ONE TOUCH ULTRA TEST test strip 1 each by Other route as needed (GLUCOSE).  05/06/15   [provider]     Family History  Problem Relation Age of Onset   Heart disease Father    Hernia Father    Healthy Sister    Stroke Paternal Grandmother    Cancer Mother        THYROID   Hypotension Mother    Heart attack Maternal Grandfather    Congestive Heart Failure Maternal Grandfather    Heart attack Paternal Grandfather    Colon cancer Maternal Aunt 79   Congestive Heart Failure Paternal Uncle    Congestive Heart Failure Maternal Grandmother    Colon cancer Other 49       MGMs brother   Colon cancer Other        MGFs brother    Social History   Socioeconomic History   Marital status: Married    Spouse name: Not on file   Number of children: Not on file   Years of education: Not on file   Highest education level: Not on file  Occupational History   Occupation: executive  Tobacco Use   Smoking status: Never   Smokeless tobacco: Never  Vaping Use   Vaping Use: Never used  Substance and Sexual Activity    Alcohol use: Yes    Alcohol/week: 1.0 standard drink of alcohol    Types: 1 Glasses of wine per week    Comment: occasional   Drug use: No   Sexual activity: Yes  Other Topics Concern   Not on file  Social History Narrative   Never  smoked    Alcohol yes, rare ,1-2 per week   No recreational drugs   Occupation-  Geneticist, molecular and other executive courses   Marital status - married     Children 1 boy         Social Determinants of Radio broadcast assistant Strain: Not on file  Food Insecurity: Not on file  Transportation Needs: Not on file  Physical Activity: Not on file  Stress: Not on file  Social Connections: Not on file     Review of Systems: A 12 point ROS discussed and pertinent positives are indicated in the HPI above.  All other systems are negative.  Review of Systems  Constitutional:  Negative for fatigue and fever.  Respiratory:  Negative for cough and shortness of breath.   Cardiovascular:  Negative for chest pain.  Gastrointestinal:  Negative for abdominal pain and nausea.  Musculoskeletal:  Negative for back pain.  Psychiatric/Behavioral:  Negative for behavioral problems and confusion.     Vital Signs: BP (!) 148/89   Pulse 67   Temp 98.9 F (37.2 C) (Oral)   Resp 18   Ht 6' (1.829 m)   Wt 212 lb (96.2 kg)   SpO2 100%   BMI 28.75 kg/m   Physical Exam Vitals and nursing note reviewed.  Constitutional:      General: He is not in acute distress.    Appearance: Normal appearance. He is not ill-appearing.  HENT:     Mouth/Throat:     Mouth: Mucous membranes are moist.     Pharynx: Oropharynx is clear.  Neck:     Comments: Well healed scar to right chest at the site of prior Port-A-Cath placement.  Cardiovascular:     Rate and Rhythm: Normal rate and regular rhythm.  Pulmonary:     Effort: Pulmonary effort is normal. No respiratory distress.     Breath sounds: Normal breath sounds.  Abdominal:     General: Abdomen is flat.      Palpations: Abdomen is soft.  Musculoskeletal:     Cervical back: Normal range of motion and neck supple.  Skin:    General: Skin is warm and dry.  Neurological:     General: No focal deficit present.     Mental Status: He is alert and oriented to person, place, and time. Mental status is at baseline.  Psychiatric:        Mood and Affect: Mood normal.        Behavior: Behavior normal.        Thought Content: Thought content normal.        Judgment: Judgment normal.      MD Evaluation Airway: WNL Heart: WNL Abdomen: WNL Chest/ Lungs: WNL ASA  Classification: 3 Mallampati/Airway Score: Two   Imaging: ECHOCARDIOGRAM COMPLETE  Result Date: 04/25/2022    ECHOCARDIOGRAM REPORT   Patient Name:   [name] Date of Exam: 04/25/2022 Medical Rec #:  [id number]        Height:       72.0 in Accession #:    [id number]       Weight:       215.0 lb Date of Birth:  May 21, 1965        BSA:          2.197 m Patient Age:    82 years         BP:           146/96  mmHg Patient Gender: M                HR:           83 bpm. Exam Location:  [address] Procedure: 2D Echo, Cardiac Doppler and Color Doppler Indications:    I05.9 Mitral Valve Disorder  History:        Patient has prior history of Echocardiogram examinations, most                 recent 06/25/2020. Mitral Valve Repair-42m Sorin Memo 4D Ring                 Annuloplasty; Risk Factors:Hypertension, Diabetes and                 Dyslipidemia.                  Mitral Valve: 28 mm prosthetic annuloplasty ring valve is                 present in the mitral position.  Sonographer:    NWilford SportsRodgers-Tabak RDCS Referring Phys: [place] 1. Septal hypokinesis likely due to post-operative state. No change compared with the echo 05/2020. Left ventricular ejection fraction, by estimation, is 60 to 65%. The left ventricle has normal function. The left ventricle demonstrates regional wall motion abnormalities (see scoring  diagram/findings for description). Left ventricular diastolic parameters are consistent with Grade I diastolic dysfunction (impaired relaxation). Elevated left ventricular end-diastolic pressure.  2. Right ventricular systolic function is normal. The right ventricular size is normal. There is normal pulmonary artery systolic pressure.  3. Mitral valve gradient unchanged from 05/2020. The mitral valve has been repaired/replaced. No evidence of mitral valve regurgitation. No evidence of mitral stenosis. The mean mitral valve gradient is 6.7 mmHg. There is a 28 mm prosthetic annuloplasty ring present in the mitral position.  4. The aortic valve is tricuspid. Aortic valve regurgitation is not visualized. No aortic stenosis is present.  5. Aortic dilatation noted. There is mild dilatation of the ascending aorta, measuring 37 mm.  6. The inferior vena cava is normal in size with greater than 50% respiratory variability, suggesting right atrial pressure of 3 mmHg. FINDINGS  Left Ventricle: Septal hypokinesis likely due to post-operative state. No change compared with the echo 05/2020. Left ventricular ejection fraction, by estimation, is 60 to 65%. The left ventricle has normal function. The left ventricle demonstrates regional wall motion abnormalities. The left ventricular internal cavity size was normal in size. There is no left ventricular hypertrophy. Left ventricular diastolic parameters are consistent with Grade I diastolic dysfunction (impaired relaxation). Elevated left ventricular end-diastolic pressure.  LV Wall Scoring: The entire septum is hypokinetic. The entire anterior wall, entire lateral wall, entire inferior wall, and apex are normal. Right Ventricle: The right ventricular size is normal. No increase in right ventricular wall thickness. Right ventricular systolic function is normal. There is normal pulmonary artery systolic pressure. The tricuspid regurgitant velocity is 2.37 m/s, and  with an assumed  right atrial pressure of 3 mmHg, the estimated right ventricular systolic pressure is 209.9mmHg. Left Atrium: Left atrial size was normal in size. Right Atrium: Right atrial size was normal in size. Pericardium: There is no evidence of pericardial effusion. Mitral Valve: Mitral valve gradient unchanged from 05/2020. The mitral valve has been repaired/replaced. No evidence of mitral valve regurgitation. There is a 28 mm prosthetic annuloplasty ring present in the mitral position. No evidence of  mitral valve stenosis. MV peak gradient, 12.0 mmHg. The mean mitral valve gradient is 6.7 mmHg. Tricuspid Valve: The tricuspid valve is normal in structure. Tricuspid valve regurgitation is trivial. No evidence of tricuspid stenosis. Aortic Valve: The aortic valve is tricuspid. Aortic valve regurgitation is not visualized. No aortic stenosis is present. Pulmonic Valve: The pulmonic valve was normal in structure. Pulmonic valve regurgitation is mild to moderate. No evidence of pulmonic stenosis. Aorta: Aortic dilatation noted. There is mild dilatation of the ascending aorta, measuring 37 mm. Venous: The inferior vena cava is normal in size with greater than 50% respiratory variability, suggesting right atrial pressure of 3 mmHg. IAS/Shunts: No atrial level shunt detected by color flow Doppler.  LEFT VENTRICLE PLAX 2D LVIDd:         4.60 cm   Diastology LVIDs:         2.50 cm   LV e' medial:    5.51 cm/s LV PW:         0.90 cm   LV E/e' medial:  25.9 LV IVS:        0.90 cm   LV e' lateral:   9.43 cm/s LVOT diam:     2.30 cm   LV E/e' lateral: 15.2 LV SV:         88 LV SV Index:   40 LVOT Area:     4.15 cm  RIGHT VENTRICLE             IVC RV Basal diam:  4.10 cm     IVC diam: 0.90 cm RV S prime:     12.85 cm/s TAPSE (M-mode): 1.5 cm LEFT ATRIUM             Index        RIGHT ATRIUM           Index LA diam:        4.40 cm 2.00 cm/m   RA Area:     13.80 cm LA Vol (A2C):   46.9 ml 21.35 ml/m  RA Volume:   32.80 ml  14.93 ml/m LA  Vol (A4C):   48.6 ml 22.12 ml/m LA Biplane Vol: 49.2 ml 22.39 ml/m  AORTIC VALVE LVOT Vmax:   106.67 cm/s LVOT Vmean:  70.767 cm/s LVOT VTI:    0.212 m  AORTA Ao Root diam: 3.50 cm Ao Asc diam:  3.70 cm MITRAL VALVE                TRICUSPID VALVE MV Area (PHT): 3.62 cm     TR Peak grad:   22.5 mmHg MV Area VTI:   1.52 cm     TR Vmax:        237.00 cm/s MV Peak grad:  12.0 mmHg MV Mean grad:  6.7 mmHg     SHUNTS MV Vmax:       1.73 m/s     Systemic VTI:  0.21 m MV Vmean:      122.7 cm/s   Systemic Diam: 2.30 cm MV Decel Time: 210 msec MV E velocity: 143.00 cm/s MV A velocity: 159.00 cm/s MV E/A ratio:  0.90 Skeet Latch MD Electronically signed by Skeet Latch MD Signature Date/Time: 04/25/2022/11:55:39 AM    Final    CT BONE TROCAR/NEEDLE BIOPSY SUPERFICIAL  Result Date: 04/21/2022 INDICATION: 57 year old male referred for biopsy of right-sided iliac bone lesion, which is avid on bone scan EXAM: CT BIOPSY CORE BONE SUPERFICIAL MEDICATIONS: None. ANESTHESIA/SEDATION: Moderate (conscious) sedation was employed during this  procedure. A total of Versed 1.0 mg and Fentanyl 50 mcg was administered intravenously. Moderate Sedation Time: 16 minutes. The patient's level of consciousness and vital signs were monitored continuously by radiology nursing throughout the procedure under my direct supervision. FLUOROSCOPY TIME:  CT COMPLICATIONS: None PROCEDURE: Informed written consent was obtained from the patient after a thorough discussion of the procedural risks, benefits and alternatives. All questions were addressed. Maximal Sterile Barrier Technique was utilized including caps, mask, sterile gowns, sterile gloves, sterile drape, hand hygiene and skin antiseptic. A timeout was performed prior to the initiation of the procedure. Patient positioned supine on the CT gantry table. Scout CT acquired for planning purposes. Using prior nuclear medicine study as reference, the vaguely sclerotic lesion of the right  anterior iliac bone was targeted. The patient is prepped and draped in the usual sterile fashion. 1% lidocaine was used for local anesthesia. Using CT guidance, Murphy needle was advanced into the anterior cortex. Once we confirmed needle tip position, a 13 gauge coaxial biopsy was acquired and placed into formalin. The 11 gauge Murphy needle was then advanced for a second core biopsy. This was placed into formalin. Needle was removed and a sterile bandage was placed. Patient tolerated the procedure well and remained hemodynamically stable throughout. No complications were encountered and no significant blood loss. IMPRESSION: Status post CT-guided biopsy of sclerotic lesion in the right anterior iliac bone corresponding to the prior nuclear medicine study. Signed, Dulcy Fanny. Nadene Rubins, RPVI [facility] Electronically Signed   By: Corrie Mckusick D.O.   On: 04/21/2022 13:40   CT Abdomen Pelvis Wo Contrast  Result Date: 04/13/2022 CLINICAL DATA:  57 year old male with history of colon cancer. Evaluate for metastatic disease. * Tracking Code: BO * EXAM: CT ABDOMEN AND PELVIS WITHOUT CONTRAST CT LUMBAR SPINE WITHOUT CONTRAST TECHNIQUE: Multidetector CT imaging of the abdomen and pelvis was performed following the standard protocol without IV contrast. Dedicated reconstructions through the lumbar spine were also generated for interpretation purposes. RADIATION DOSE REDUCTION: This exam was performed according to the departmental dose-optimization program which includes automated exposure control, adjustment of the mA and/or kV according to patient size and/or use of iterative reconstruction technique. COMPARISON:  CT of the chest, abdomen and pelvis 01/04/2022. FINDINGS: Lower chest: Status post mitral valve replacement. Atherosclerotic calcifications in the descending thoracic aorta as well as the right coronary artery. Hepatobiliary: No definite  suspicious cystic or solid hepatic lesions are confidently identified on today's noncontrast CT examination. 3 mm calcified gallstone lying dependently in the gallbladder. Gallbladder does not appear distended. No pericholecystic fluid or surrounding inflammatory changes. Pancreas: No pancreatic mass or peripancreatic fluid collections or inflammatory changes are noted on today's noncontrast CT examination. Spleen: Spleen is enlarged and heterogeneous in appearance. Previously demonstrated splenic mass is poorly evaluated on today's noncontrast CT examination, but the overall size and appearance of the spleen is very similar to the prior study, in addition to numerous prior examinations, presumably a benign lesion such as a splenic hamartoma. Adrenals/Urinary Tract: Status post left nephrectomy. No unexpected soft tissue mass noted in the nephrectomy bed to suggest local recurrence of disease. Left adrenal gland is unremarkable in appearance. Unenhanced appearance of the right kidney and right adrenal gland is normal in appearance. No right hydroureteronephrosis. Urinary bladder is largely decompressed, but otherwise unremarkable in appearance. Stomach/Bowel: Unenhanced appearance of the stomach is unremarkable. No pathologic dilatation of small bowel or colon. Postoperative changes of prior left hemicolectomy  are noted. No unexpected soft tissue mass at the suture line to suggest locally recurrent disease noted on today's noncontrast CT examination. Vascular/Lymphatic: Atherosclerosis in the abdominal aorta and pelvic vasculature. No definite lymphadenopathy confidently identified in the abdomen or pelvis on today's noncontrast CT examination. Reproductive: Prostate gland and seminal vesicles are unremarkable in appearance. Other: No significant volume of ascites noted in the visualized portions of the peritoneal cavity. Musculoskeletal: Numerous mixed lytic and sclerotic lesions are scattered throughout the  visualized axial and appendicular skeleton, including several areas in the lumbar spine. This includes near complete infiltration of the L3 vertebral body, however, all of the vertebral bodies in the lumbar spine have a generalized moth-eaten appearance. The largest extra-spinal lesion on today's examination is in the right ilium (axial image 51 of series 2) measuring 3.0 x 2.0 cm. Focal area of sclerosis also noted in the inferior aspect of the sternum (axial image 1 of series 2. Dextroscoliosis of the lumbar spine. IMPRESSION: 1. 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 2. Status post left hemicolectomy and left nephrectomy. No extra skeletal metastatic disease confidently identified in the abdomen or pelvis on today's noncontrast CT examination. 3. Stable appearance of the spleen, poorly evaluated on today's noncontrast CT examination, but favored to represent a benign lesions such as a large splenic hamartoma. 4. Cholelithiasis without evidence of acute cholecystitis. 5. Aortic atherosclerosis, in addition to at least right coronary artery disease. Please note that although the presence of coronary artery calcium documents the presence of coronary artery disease, the severity of this disease and any potential stenosis cannot be assessed on this non-gated CT examination. Assessment for potential risk factor modification, dietary therapy or pharmacologic therapy may be warranted, if clinically indicated. 6. Additional incidental findings, as above. Electronically Signed   By: Vinnie Langton M.D.   On: 04/13/2022 12:59   CT Lumbar Spine Wo Contrast  Result Date: 04/13/2022 CLINICAL DATA:  57 year old male with history of colon cancer. Evaluate for metastatic disease. * Tracking Code: BO * EXAM: CT ABDOMEN AND PELVIS WITHOUT CONTRAST CT LUMBAR SPINE WITHOUT CONTRAST TECHNIQUE: Multidetector CT imaging of the abdomen and pelvis was performed following the standard protocol without IV contrast.  Dedicated reconstructions through the lumbar spine were also generated for interpretation purposes. RADIATION DOSE REDUCTION: This exam was performed according to the departmental dose-optimization program which includes automated exposure control, adjustment of the mA and/or kV according to patient size and/or use of iterative reconstruction technique. COMPARISON:  CT of the chest, abdomen and pelvis 01/04/2022. FINDINGS: Lower chest: Status post mitral valve replacement. Atherosclerotic calcifications in the descending thoracic aorta as well as the right coronary artery. Hepatobiliary: No definite suspicious cystic or solid hepatic lesions are confidently identified on today's noncontrast CT examination. 3 mm calcified gallstone lying dependently in the gallbladder. Gallbladder does not appear distended. No pericholecystic fluid or surrounding inflammatory changes. Pancreas: No pancreatic mass or peripancreatic fluid collections or inflammatory changes are noted on today's noncontrast CT examination. Spleen: Spleen is enlarged and heterogeneous in appearance. Previously demonstrated splenic mass is poorly evaluated on today's noncontrast CT examination, but the overall size and appearance of the spleen is very similar to the prior study, in addition to numerous prior examinations, presumably a benign lesion such as a splenic hamartoma. Adrenals/Urinary Tract: Status post left nephrectomy. No unexpected soft tissue mass noted in the nephrectomy bed to suggest local recurrence of disease. Left adrenal gland is unremarkable in appearance. Unenhanced appearance of the right kidney and  right adrenal gland is normal in appearance. No right hydroureteronephrosis. Urinary bladder is largely decompressed, but otherwise unremarkable in appearance. Stomach/Bowel: Unenhanced appearance of the stomach is unremarkable. No pathologic dilatation of small bowel or colon. Postoperative changes of prior left hemicolectomy are noted.  No unexpected soft tissue mass at the suture line to suggest locally recurrent disease noted on today's noncontrast CT examination. Vascular/Lymphatic: Atherosclerosis in the abdominal aorta and pelvic vasculature. No definite lymphadenopathy confidently identified in the abdomen or pelvis on today's noncontrast CT examination. Reproductive: Prostate gland and seminal vesicles are unremarkable in appearance. Other: No significant volume of ascites noted in the visualized portions of the peritoneal cavity. Musculoskeletal: Numerous mixed lytic and sclerotic lesions are scattered throughout the visualized axial and appendicular skeleton, including several areas in the lumbar spine. This includes near complete infiltration of the L3 vertebral body, however, all of the vertebral bodies in the lumbar spine have a generalized moth-eaten appearance. The largest extra-spinal lesion on today's examination is in the right ilium (axial image 51 of series 2) measuring 3.0 x 2.0 cm. Focal area of sclerosis also noted in the inferior aspect of the sternum (axial image 1 of series 2. Dextroscoliosis of the lumbar spine. IMPRESSION: 1. 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 2. Status post left hemicolectomy and left nephrectomy. No extra skeletal metastatic disease confidently identified in the abdomen or pelvis on today's noncontrast CT examination. 3. Stable appearance of the spleen, poorly evaluated on today's noncontrast CT examination, but favored to represent a benign lesions such as a large splenic hamartoma. 4. Cholelithiasis without evidence of acute cholecystitis. 5. Aortic atherosclerosis, in addition to at least right coronary artery disease. Please note that although the presence of coronary artery calcium documents the presence of coronary artery disease, the severity of this disease and any potential stenosis cannot be assessed on this non-gated CT examination. Assessment for potential risk factor  modification, dietary therapy or pharmacologic therapy may be warranted, if clinically indicated. 6. Additional incidental findings, as above. Electronically Signed   By: Vinnie Langton M.D.   On: 04/13/2022 12:59   XR Pelvis 1-2 Views  Result Date: 04/11/2022 AP pelvis radiographs reviewed.  Minimal degenerative arthritis in either hip.  No acute fracture.  No destructive bony lesions present within the bony pelvis or proximal femurs.  XR Lumbar Spine 2-3 Views  Result Date: 04/11/2022 AP lateral radiographs lumbar spine reviewed.  Significant scoliosis is present.  Spondylolisthesis present in the middle lumbar spine region.  Degenerative compression is noted in the L2 vertebral body.  No destructive bony processes within the vertebral body.   Labs:  CBC: Recent Labs    08/16/21 0302 12/29/21 1216 04/14/22 1202 04/21/22 0951  WBC 7.2 5.7 6.6 4.8  HGB 13.7 15.4 15.0 14.7  HCT 39.5 45.9 44.4 44.1  PLT 143* 153 206 168    COAGS: Recent Labs    08/13/21 1235  INR 1.1  APTT 27    BMP: Recent Labs    08/16/21 0302 12/29/21 1216 01/24/22 0739 04/14/22 1202 04/18/22 1047  NA 137 137 137 136 139  K 4.5 4.8 4.6 4.2 4.3  CL 104 105 100 99 100  CO2 '26 25 28 27 23  '$ GLUCOSE 169* 180* 128* 212* 220*  BUN '17 18 20 19 18  '$ CALCIUM 8.6* 9.2 9.4 9.4 9.6  CREATININE 0.95 0.99 1.11 0.94 1.04  GFRNONAA >60 >60 >60 >60  --     LIVER FUNCTION TESTS: Recent Labs  08/13/21 1235 08/14/21 0536 12/29/21 1216 04/14/22 1202  BILITOT 1.9* 1.4* 1.1 0.6  AST '30 28 27 19  '$ ALT '30 26 29 19  '$ ALKPHOS 45 39 43 175*  PROT 7.2 6.0* 7.1 7.6  ALBUMIN 3.7 2.9* 3.7 4.1    TUMOR MARKERS: Recent Labs    07/19/21 0759 01/24/22 0739  CEA 1.15 1.74    Assessment and Plan: Patient with past medical history of mitral valve repair complicated by septic emboli, HTN, osteomyelitis presents with complaint of recurrence of colon cancer.  IR consulted for Port-A-Cath placement at the request  of Ned Card, NP. Case reviewed by Dr. Earleen Newport who approves patient for procedure. 2g Ancef order for prophylactic abx.  Patient presents today in their usual state of health.  He has been NPO and is not currently on blood thinners.   Risks and benefits of image guided port-a-catheter placement was discussed with the patient including, but not limited to bleeding, infection, pneumothorax, or fibrin sheath development and need for additional procedures.  All of the patient's questions were answered, patient is agreeable to proceed. Consent signed and in chart.   Thank you for this interesting consult.  I greatly enjoyed meeting [name] and look forward to participating in their care.  A copy of this report was sent to the requesting provider on this date.  Electronically Signed: Docia Barrier, PA 05/08/2022, 2:47 PM   I spent a total of    15 Minutes in face to face in clinical consultation, greater than 50% of which was counseling/coordinating care for colon cancer.

## 2022-05-08 NOTE — Procedures (Signed)
Interventional Radiology Procedure Note  Procedure: Placement of a right IJ approach single lumen PowerPort.  Tip is positioned at the superior cavoatrial junction and catheter is ready for immediate use.  Complications: None Recommendations:  - Ok to shower tomorrow - Do not submerge for 7 days - Routine line care   Signed,  Jonathon Tan S. Brettany Sydney, DO   

## 2022-05-09 ENCOUNTER — Encounter (HOSPITAL_COMMUNITY)
Admission: RE | Admit: 2022-05-09 | Discharge: 2022-05-09 | Disposition: A | Discharge status: Home / Self Care | Payer: BC Managed Care – PPO | Source: Ambulatory Visit | Attending: Nurse Practitioner | Admitting: Nurse Practitioner

## 2022-05-09 DIAGNOSIS — C186 Malignant neoplasm of descending colon: Secondary | ICD-10-CM | POA: Diagnosis not present

## 2022-05-09 DIAGNOSIS — C7951 Secondary malignant neoplasm of bone: Secondary | ICD-10-CM | POA: Diagnosis not present

## 2022-05-09 DIAGNOSIS — K8020 Calculus of gallbladder without cholecystitis without obstruction: Secondary | ICD-10-CM | POA: Diagnosis not present

## 2022-05-09 DIAGNOSIS — C189 Malignant neoplasm of colon, unspecified: Secondary | ICD-10-CM | POA: Diagnosis not present

## 2022-05-09 DIAGNOSIS — M4856XA Collapsed vertebra, not elsewhere classified, lumbar region, initial encounter for fracture: Secondary | ICD-10-CM | POA: Diagnosis not present

## 2022-05-09 LAB — GLUCOSE, CAPILLARY: Glucose-Capillary: 197 mg/dL — ABNORMAL HIGH (ref 70–99)

## 2022-05-09 MED ORDER — FLUDEOXYGLUCOSE F - 18 (FDG) INJECTION
10.5000 | Freq: Once | INTRAVENOUS | Status: AC | PRN
  Administered 2022-05-09: 10.5 via INTRAVENOUS

## 2022-05-14 ENCOUNTER — Other Ambulatory Visit: Payer: Self-pay | Admitting: *Deleted

## 2022-05-14 ENCOUNTER — Inpatient Hospital Stay: Payer: BC Managed Care – PPO | Attending: Oncology | Admitting: Nurse Practitioner

## 2022-05-14 ENCOUNTER — Encounter: Payer: Self-pay | Admitting: Nurse Practitioner

## 2022-05-14 VITALS — BP 141/88 | HR 71 | Temp 98.1°F | Resp 18 | Ht 72.0 in | Wt 219.2 lb

## 2022-05-14 DIAGNOSIS — Z5112 Encounter for antineoplastic immunotherapy: Secondary | ICD-10-CM | POA: Diagnosis not present

## 2022-05-14 DIAGNOSIS — Z79899 Other long term (current) drug therapy: Secondary | ICD-10-CM | POA: Insufficient documentation

## 2022-05-14 DIAGNOSIS — Z5111 Encounter for antineoplastic chemotherapy: Secondary | ICD-10-CM | POA: Insufficient documentation

## 2022-05-14 DIAGNOSIS — C186 Malignant neoplasm of descending colon: Secondary | ICD-10-CM | POA: Diagnosis not present

## 2022-05-14 DIAGNOSIS — C7951 Secondary malignant neoplasm of bone: Secondary | ICD-10-CM | POA: Insufficient documentation

## 2022-05-14 DIAGNOSIS — C189 Malignant neoplasm of colon, unspecified: Secondary | ICD-10-CM | POA: Diagnosis not present

## 2022-05-14 MED ORDER — PROCHLORPERAZINE MALEATE 10 MG PO TABS: 10.0000 mg | ORAL_TABLET | Freq: Four times a day (QID) | ORAL | 3 refills | Status: DC | PRN

## 2022-05-14 MED ORDER — LIDOCAINE-PRILOCAINE 2.5-2.5 % EX CREA: TOPICAL_CREAM | CUTANEOUS | 3 refills | Status: DC

## 2022-05-14 NOTE — Progress Notes (Signed)
DISCONTINUE ON PATHWAY REGIMEN - Colorectal     A cycle is every 14 days:     Oxaliplatin      Leucovorin      Fluorouracil      Fluorouracil   **Always confirm dose/schedule in your pharmacy ordering system**  REASON: Disease Progression PRIOR TREATMENT: COS67: [id number] q14 Days x 6 Months TREATMENT RESPONSE: Unable to Evaluate  START ON PATHWAY REGIMEN - Colorectal     A cycle is every 14 days:     Bevacizumab-xxxx      Irinotecan      Leucovorin      Fluorouracil      Fluorouracil   **Always confirm dose/schedule in your pharmacy ordering system**  Patient Characteristics: Distant Metastases, Nonsurgical Candidate, KRAS/NRAS Mutation Positive/Unknown (BRAF V600 Wild-Type/Unknown), Standard Cytotoxic Therapy, First Line Standard Cytotoxic Therapy, Bevacizumab Eligible, PS = 0,1 Tumor Location: Colon Therapeutic Status: Distant Metastases Microsatellite/Mismatch Repair Status: MSS/pMMR BRAF Mutation Status: Wild-Type (no mutation) KRAS/NRAS Mutation Status: Mutation Positive Preferred Therapy Approach: Standard Cytotoxic Therapy Standard Cytotoxic Line of Therapy: First Line Standard Cytotoxic Therapy ECOG Performance Status: 1 Bevacizumab Eligibility: Eligible Intent of Therapy: Non-Curative / Palliative Intent, Discussed with Patient

## 2022-05-14 NOTE — Progress Notes (Signed)
[facility] OFFICE PROGRESS NOTE   Diagnosis: Colon cancer  INTERVAL HISTORY:   David [name] returns as scheduled.  He continues to have low back pain.  He is able to control the pain with ibuprofen/Aleve, Voltaren gel.  Objective:  Vital signs in last 24 hours:  Blood pressure (!) 141/88, pulse 71, temperature 98.1 F (36.7 C), resp. rate 18, height 6' (1.829 m), weight 219 lb 3.2 oz (99.4 kg), SpO2 100 %.    Resp: Lungs clear bilaterally. Cardio: Regular rate and rhythm. GI: Abdomen soft and nontender.  No hepatomegaly. Vascular: Left lower leg is slightly larger than the right lower leg. Skin: Palms without erythema. Port-A-Cath without erythema.  Lab Results:  Lab Results  Component Value Date   WBC 4.8 04/21/2022   HGB 14.7 04/21/2022   HCT 44.1 04/21/2022   MCV 92.8 04/21/2022   PLT 168 04/21/2022   NEUTROABS 2.8 04/21/2022    Imaging:  No results found.  Medications: I have reviewed the patient's current medications.  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, status post a left colectomy 03/04/2019 Mass felt to be in the transverse colon on a colonoscopy 01/07/2019 with a biopsy confirming poorly differentiated adenocarcinoma with signet ring cell features, mismatch repair protein expression normal CT abdomen/pelvis 01/08/2019 12.3 cm indeterminate splenic mass, present in 2015 on a lumbar MRI-felt to be benign, small subpleural and pleural-based nodules at the lung bases, status post left nephrectomy CT chest 01/20/2019-small bilateral pulmonary nodules with rounded parenchymal nodules in the right lower lobe, indeterminate splenic mass CT chest 03/18/2019-multiple subcentimeter pulmonary nodules again seen bilaterally, greatest in the lower lobes, without significant change.  No new or enlarging pulmonary nodules or masses.  Partially  visualized large heterogeneous splenic mass with no significant change.  Plan for follow-up chest CT at a 42-monthinterval. Cycle 1 FOLFOX 03/21/2019 Cycle 2 FOLFOX 04/04/2019 Cycle 3 FOLFOX 04/18/2019 (oxaliplatin dose reduced secondary to thrombocytopenia) Cycle 4 FOLFOX 05/02/2019 (oxaliplatin held secondary to thrombocytopenia) Cycle 5 FOLFOX 05/16/2019 Cycle 6 FOLFOX 05/30/2019 (oxaliplatin held secondary to thrombocytopenia) CT chest 06/08/2019-stable scattered small solid pulmonary nodules  Cycle 7 FOLFOX 06/13/2019 Cycle 8 FOLFOX 06/27/2019 Cycle 9 FOLFOX 07/11/2019 Cycle 10 FOLFOX 07/25/2019 (oxaliplatin held due to thrombocytopenia) Cycle 11 FOLFOX 08/08/2019 Cycle 12 FOLFOX 08/22/2019 CTs 01/09/2020-no evidence of recurrent disease, stable lung nodules favored to represent subpleural lymph nodes-dedicated follow-up not recommended CTs 01/11/2021-no evidence of recurrent disease CTs 01/24/2022-no evidence of recurrent disease CT lumbar spine, abdomen/pelvis 04/11/2022-numerous mixed lytic and sclerotic lesions scattered throughout the visualized axial and appendicular skeleton including several areas in the lumbar spine.   04/21/2022 CT biopsy sclerotic lesion right anterior iliac bone-consistent with metastatic colorectal adenocarcinoma, moderate to poorly differentiated.  Foundation 1-microsatellite stable, tumor mutation burden 4, K-ras G12S PET scan [id number] mixed faintly lytic and sclerotic osseous metastases throughout the axial and proximal appendicular skeleton; associated mild pathologic compression fracture at L3; no hypermetabolic extraosseous metastatic disease.   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  Left nephrectomy at [name] [age]  Port-A-Cath placement, Dr. GJohney [name] 03/16/2019 13.  Thrombocytopenia secondary to chemotherapy-oxaliplatin dose reduced beginning with cycle 3 FOLFOX, improved 14.  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  Right groin soft tissue infection May 2023-status postsurgical debridement 18.  Left leg DVT-common femoral, femoral, popliteal, posterior tibial, and peroneal veins 19.  02/04/2022-MRI cervical spine-1.7 cm T1 lesion-indeterminate; bone scan 03/14/2022-focal intense activity at approximate T1 level felt to correspond to the lesion on MRI, additional foci of activity involving the lower thoracic and lumbar spine, right iliac bone and pubic symphysis.  CT lumbar spine, abdomen/pelvis 04/11/2022-numerous mixed lytic and sclerotic lesions scattered throughout the visualized axial and appendicular skeleton including several areas in the lumbar spine.  04/21/2022 CT biopsy sclerotic lesion right anterior iliac bone-consistent with metastatic colorectal adenocarcinoma, moderate to poorly differentiated    Disposition: David [name] appears unchanged.  We reviewed the PET scan report/images and foundation 1 results with him and his wife at today's visit.  They understand the PET scan shows multiple bone metastases.  They further understand no therapy will be curative.  Dr. Benay [name] recommends beginning treatment with FOLFIRI/Avastin.  We reviewed potential side effects associated with chemotherapy including bone marrow toxicity, nausea, mouth sores, diarrhea, hair loss.  We discussed the early and  late phase diarrhea associated with irinotecan.  We discussed potential side effects associated with Avastin including bleeding, delayed wound healing, allergic reaction, bowel perforation, wound dehiscence, increased risk of blood clots/strokes, proteinuria, hypertension, posterior reversible encephalopathy syndrome.  He was provided with printed information as well.  He agrees to proceed.  He will return for follow-up and cycle 1 FOLFIRI/Avastin on 05/26/2022.  We are available to see him sooner if needed.  Patient seen with Dr. Benay [name].    Ned Card ANP/GNP-BC   05/14/2022  9:19 AM This was a shared visit with Ned Card.  We reviewed the PET findings and images with David [name].  We reviewed results from NGS testing.  We discussed treatment options.  He understands no therapy will be curative.  The goal of systemic therapy is to improve symptoms and prolong survival.  He is not a candidate for an EGFR inhibitor.  He was treated with FOLFOX in the adjuvant setting.  I recommend FOLFIRI/bevacizumab.  We reviewed potential toxicities associated with this regimen he agrees to proceed.  The disease activity may be difficult to follow given the "bone only "metastases.  A chemotherapy plan was entered today.  I was present for greater than 50% of today's visit.  Performed medical decision making.  Julieanne Manson, MD

## 2022-05-15 ENCOUNTER — Other Ambulatory Visit: Payer: Self-pay

## 2022-05-16 ENCOUNTER — Inpatient Hospital Stay: Payer: BC Managed Care – PPO | Admitting: Oncology

## 2022-05-25 ENCOUNTER — Other Ambulatory Visit: Payer: Self-pay | Admitting: Oncology

## 2022-05-26 ENCOUNTER — Inpatient Hospital Stay: Payer: BC Managed Care – PPO

## 2022-05-26 ENCOUNTER — Inpatient Hospital Stay: Payer: BC Managed Care – PPO | Admitting: Oncology

## 2022-05-26 VITALS — BP 151/88 | HR 74 | Temp 98.1°F | Resp 18 | Ht 72.0 in | Wt 216.2 lb

## 2022-05-26 VITALS — BP 147/81 | HR 92

## 2022-05-26 DIAGNOSIS — R300 Dysuria: Secondary | ICD-10-CM | POA: Diagnosis not present

## 2022-05-26 DIAGNOSIS — Z5111 Encounter for antineoplastic chemotherapy: Secondary | ICD-10-CM | POA: Diagnosis not present

## 2022-05-26 DIAGNOSIS — Z5112 Encounter for antineoplastic immunotherapy: Secondary | ICD-10-CM | POA: Diagnosis not present

## 2022-05-26 DIAGNOSIS — C186 Malignant neoplasm of descending colon: Secondary | ICD-10-CM

## 2022-05-26 DIAGNOSIS — C7951 Secondary malignant neoplasm of bone: Secondary | ICD-10-CM | POA: Diagnosis not present

## 2022-05-26 DIAGNOSIS — Z79899 Other long term (current) drug therapy: Secondary | ICD-10-CM | POA: Diagnosis not present

## 2022-05-26 DIAGNOSIS — C189 Malignant neoplasm of colon, unspecified: Secondary | ICD-10-CM

## 2022-05-26 LAB — CBC WITH DIFFERENTIAL (CANCER CENTER ONLY)
Abs Immature Granulocytes: 0.04 10*3/uL (ref 0.00–0.07)
Basophils Absolute: 0.1 10*3/uL (ref 0.0–0.1)
Basophils Relative: 1 %
Eosinophils Absolute: 0.4 10*3/uL (ref 0.0–0.5)
Eosinophils Relative: 6 %
HCT: 42.2 % (ref 39.0–52.0)
Hemoglobin: 14.2 g/dL (ref 13.0–17.0)
Immature Granulocytes: 1 %
Lymphocytes Relative: 14 %
Lymphs Abs: 1 10*3/uL (ref 0.7–4.0)
MCH: 30.8 pg (ref 26.0–34.0)
MCHC: 33.6 g/dL (ref 30.0–36.0)
MCV: 91.5 fL (ref 80.0–100.0)
Monocytes Absolute: 0.6 10*3/uL (ref 0.1–1.0)
Monocytes Relative: 8 %
Neutro Abs: 5.1 10*3/uL (ref 1.7–7.7)
Neutrophils Relative %: 70 %
Platelet Count: 166 10*3/uL (ref 150–400)
RBC: 4.61 MIL/uL (ref 4.22–5.81)
RDW: 13.1 % (ref 11.5–15.5)
WBC Count: 7.2 10*3/uL (ref 4.0–10.5)
nRBC: 0 % (ref 0.0–0.2)

## 2022-05-26 LAB — URINALYSIS, COMPLETE (UACMP) WITH MICROSCOPIC
Bacteria, UA: NONE SEEN
Bilirubin Urine: NEGATIVE
Glucose, UA: NEGATIVE mg/dL
Hgb urine dipstick: NEGATIVE
Ketones, ur: NEGATIVE mg/dL
Leukocytes,Ua: NEGATIVE
Nitrite: NEGATIVE
Protein, ur: 30 mg/dL — AB
Specific Gravity, Urine: 1.024 (ref 1.005–1.030)
pH: 5.5 (ref 5.0–8.0)

## 2022-05-26 LAB — CMP (CANCER CENTER ONLY)
ALT: 18 U/L (ref 0–44)
AST: 24 U/L (ref 15–41)
Albumin: 4 g/dL (ref 3.5–5.0)
Alkaline Phosphatase: 379 U/L — ABNORMAL HIGH (ref 38–126)
Anion gap: 8 (ref 5–15)
BUN: 20 mg/dL (ref 6–20)
CO2: 25 mmol/L (ref 22–32)
Calcium: 9.4 mg/dL (ref 8.9–10.3)
Chloride: 100 mmol/L (ref 98–111)
Creatinine: 1.05 mg/dL (ref 0.61–1.24)
GFR, Estimated: >60 mL/min (ref >60)
Glucose, Bld: 225 mg/dL — ABNORMAL HIGH (ref 70–99)
Potassium: 4.6 mmol/L (ref 3.5–5.1)
Sodium: 133 mmol/L — ABNORMAL LOW (ref 135–145)
Total Bilirubin: 0.7 mg/dL (ref 0.3–1.2)
Total Protein: 7.1 g/dL (ref 6.5–8.1)

## 2022-05-26 LAB — TOTAL PROTEIN, URINE DIPSTICK: Protein, ur: 100 mg/dL — AB

## 2022-05-26 LAB — CEA (ACCESS): CEA (CHCC): 6.56 ng/mL — ABNORMAL HIGH (ref 0.00–5.00)

## 2022-05-26 MED ORDER — PALONOSETRON HCL INJECTION 0.25 MG/5ML
0.2500 mg | Freq: Once | INTRAVENOUS | Status: AC
  Administered 2022-05-26: 0.25 mg via INTRAVENOUS
  Filled 2022-05-26: qty 5

## 2022-05-26 MED ORDER — SODIUM CHLORIDE 0.9 % IV SOLN: Freq: Once | INTRAVENOUS | Status: AC

## 2022-05-26 MED ORDER — SODIUM CHLORIDE 0.9 % IV SOLN
180.0000 mg/m2 | Freq: Once | INTRAVENOUS | Status: AC
  Administered 2022-05-26: 400 mg via INTRAVENOUS
  Filled 2022-05-26: qty 15

## 2022-05-26 MED ORDER — SODIUM CHLORIDE 0.9 % IV SOLN
10.0000 mg | Freq: Once | INTRAVENOUS | Status: AC
  Administered 2022-05-26: 10 mg via INTRAVENOUS
  Filled 2022-05-26: qty 1

## 2022-05-26 MED ORDER — ATROPINE SULFATE 1 MG/ML IV SOLN
0.5000 mg | Freq: Once | INTRAVENOUS | Status: AC | PRN
  Administered 2022-05-26: 0.5 mg via INTRAVENOUS
  Filled 2022-05-26: qty 1

## 2022-05-26 MED ORDER — SODIUM CHLORIDE 0.9 % IV SOLN
2400.0000 mg/m2 | INTRAVENOUS | Status: DC
  Administered 2022-05-26: 5000 mg via INTRAVENOUS
  Filled 2022-05-26: qty 100

## 2022-05-26 MED ORDER — SODIUM CHLORIDE 0.9 % IV SOLN
400.0000 mg/m2 | Freq: Once | INTRAVENOUS | Status: AC
  Administered 2022-05-26: 900 mg via INTRAVENOUS
  Filled 2022-05-26: qty 45

## 2022-05-26 MED ORDER — SODIUM CHLORIDE 0.9 % IV SOLN
5.0000 mg/kg | Freq: Once | INTRAVENOUS | Status: AC
  Administered 2022-05-26: 500 mg via INTRAVENOUS
  Filled 2022-05-26: qty 16

## 2022-05-26 MED ORDER — FLUOROURACIL CHEMO INJECTION 2.5 GM/50ML
400.0000 mg/m2 | Freq: Once | INTRAVENOUS | Status: AC
  Administered 2022-05-26: 1000 mg via INTRAVENOUS
  Filled 2022-05-26: qty 20

## 2022-05-26 NOTE — Progress Notes (Signed)
[name] OFFICE PROGRESS NOTE   Diagnosis: Colon cancer  INTERVAL HISTORY:   [name] returns as scheduled.  He feels well.  He returned from a business trip to [place] last weekend.  He noted mild dark discoloration of his urine this morning.  Mild lower back discomfort.  No other complaint.  Objective:  Vital signs in last 24 hours:  Blood pressure (!) 151/88, pulse 74, temperature 98.1 F (36.7 C), temperature source Oral, resp. rate 18, height 6' (1.829 m), weight 216 lb 3.2 oz (98.1 kg), SpO2 96 %.   Resp: Lungs clear bilaterally Cardio: Regular rate and rhythm GI: No hepatosplenomegaly Vascular: No leg edema  Portacath/PICC-without erythema  Lab Results:  Lab Results  Component Value Date   WBC 7.2 05/26/2022   HGB 14.2 05/26/2022   HCT 42.2 05/26/2022   MCV 91.5 05/26/2022   PLT 166 05/26/2022   NEUTROABS 5.1 05/26/2022    CMP  Lab Results  Component Value Date   NA 133 (L) 05/26/2022   K 4.6 05/26/2022   CL 100 05/26/2022   CO2 25 05/26/2022   GLUCOSE 225 (H) 05/26/2022   BUN 20 05/26/2022   CREATININE 1.05 05/26/2022   CALCIUM 9.4 05/26/2022   PROT 7.1 05/26/2022   ALBUMIN 4.0 05/26/2022   AST 24 05/26/2022   ALT 18 05/26/2022   ALKPHOS 379 (H) 05/26/2022   BILITOT 0.7 05/26/2022   GFRNONAA >60 05/26/2022   GFRAA 115 02/08/2020    Lab Results  Component Value Date   CEA1 2.00 07/13/2020   CEA 6.56 (H) 05/26/2022    Medications: I have reviewed the patient's current medications.   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, status post a left colectomy 03/04/2019 Mass felt to be in the transverse colon on a colonoscopy 01/07/2019 with a biopsy confirming poorly differentiated adenocarcinoma with signet ring cell features, mismatch repair protein expression normal CT abdomen/pelvis 01/08/2019 12.3 cm  indeterminate splenic mass, present in 2015 on a lumbar MRI-felt to be benign, small subpleural and pleural-based nodules at the lung bases, status post left nephrectomy CT chest 01/20/2019-small bilateral pulmonary nodules with rounded parenchymal nodules in the right lower lobe, indeterminate splenic mass CT chest 03/18/2019-multiple subcentimeter pulmonary nodules again seen bilaterally, greatest in the lower lobes, without significant change.  No new or enlarging pulmonary nodules or masses.  Partially visualized large heterogeneous splenic mass with no significant change.  Plan for follow-up chest CT at a 20-monthinterval. Cycle 1 FOLFOX 03/21/2019 Cycle 2 FOLFOX 04/04/2019 Cycle 3 FOLFOX 04/18/2019 (oxaliplatin dose reduced secondary to thrombocytopenia) Cycle 4 FOLFOX 05/02/2019 (oxaliplatin held secondary to thrombocytopenia) Cycle 5 FOLFOX 05/16/2019 Cycle 6 FOLFOX 05/30/2019 (oxaliplatin held secondary to thrombocytopenia) CT chest 06/08/2019-stable scattered small solid pulmonary nodules  Cycle 7 FOLFOX 06/13/2019 Cycle 8 FOLFOX 06/27/2019 Cycle 9 FOLFOX 07/11/2019 Cycle 10 FOLFOX 07/25/2019 (oxaliplatin held due to thrombocytopenia) Cycle 11 FOLFOX 08/08/2019 Cycle 12 FOLFOX 08/22/2019 CTs 01/09/2020-no evidence of recurrent disease, stable lung nodules favored to represent subpleural lymph nodes-dedicated follow-up not recommended CTs 01/11/2021-no evidence of recurrent disease CTs 01/24/2022-no evidence of recurrent disease CT lumbar spine, abdomen/pelvis 04/11/2022-numerous mixed lytic and sclerotic lesions scattered throughout the visualized axial and appendicular skeleton including several areas in the lumbar spine.   04/21/2022 CT biopsy sclerotic lesion right anterior iliac bone-consistent with metastatic colorectal adenocarcinoma, moderate to poorly differentiated.  Foundation 1-microsatellite stable, tumor mutation burden 4, K-ras G12S PET  scan [id number] hypermetabolic mixed  faintly lytic and sclerotic osseous metastases throughout the axial and proximal appendicular skeleton; associated mild pathologic compression fracture at L3; no hypermetabolic extraosseous metastatic disease.   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  Left nephrectomy at age 35 12.  Port-A-Cath placement, Dr. Johney Maine, 03/16/2019 13.  Thrombocytopenia secondary to chemotherapy-oxaliplatin dose reduced beginning with cycle 3 FOLFOX, improved 14.  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  Right groin soft tissue infection May 2023-status postsurgical debridement 18.  Left leg DVT-common femoral, femoral, popliteal, posterior tibial, and peroneal veins 19.  02/04/2022-MRI cervical spine-1.7 cm T1 lesion-indeterminate; bone scan 03/14/2022-focal intense activity at approximate T1 level felt to correspond to the lesion on MRI, additional foci of activity involving the lower thoracic and lumbar spine, right iliac bone and pubic symphysis.  CT lumbar spine, abdomen/pelvis 04/11/2022-numerous mixed lytic and sclerotic lesions scattered throughout the visualized axial and appendicular skeleton including several areas in the lumbar spine.  04/21/2022 CT biopsy sclerotic lesion right anterior iliac bone-consistent with metastatic colorectal adenocarcinoma, moderate to poorly  differentiated     Disposition: [name] appears stable.  The plan is to begin systemic therapy with FOLFIRI/bevacizumab today.  We reviewed potential toxicities associated with this regimen.  He agrees to proceed.  Labs are reviewed today.  He will return for an office visit and chemotherapy in 2 weeks.  Betsy Coder, MD  05/26/2022  9:28 AM

## 2022-05-26 NOTE — Patient Instructions (Addendum)
[facility]   The chemotherapy medication bag should finish at 46 hours, 96 hours, or 7 days. For example, if your pump is scheduled for 46 hours and it was put on at 4:00 p.m., it should finish at 2:00 p.m. the day it is scheduled to come off regardless of your appointment time.     Estimated time to finish at 11:15  Wednesday, May 28, 2022.   If the display on your pump reads "Low Volume" and it is beeping, take the batteries out of the pump and come to the cancer center for it to be taken off.   If the pump alarms go off prior to the pump reading "Low Volume" then call [phone] and someone can assist you.  If the plunger comes out and the chemotherapy medication is leaking out, please use your home chemo spill kit to clean up the spill. Do NOT use paper towels or other household products.  If you have problems or questions regarding your pump, please call either [phone] (24 hours a day) or the cancer center Monday-Friday 8:00 a.m.- 4:30 p.m. at the clinic number and we will assist you. If you are unable to get assistance, then go to the nearest Emergency Department and ask the staff to contact the IV team for assistance.  Discharge Instructions: Thank you for choosing [facility] to provide your oncology and hematology care.   If you have a lab appointment with the [facility], please go directly to the [facility] and check in at the registration area.   Wear comfortable clothing and clothing appropriate for easy access to any Portacath or PICC line.   We strive to give you quality time with your provider. You may need to reschedule your appointment if you arrive late (15 or more minutes).  Arriving late affects you and other patients whose appointments are after yours.  Also, if you miss three or more appointments without notifying the office, you may be dismissed from the clinic at the provider's discretion.      For  prescription refill requests, have your pharmacy contact our office and allow 72 hours for refills to be completed.    Today you received the following chemotherapy and/or immunotherapy agents Avastin, Irinotecan, Leucovorin, Fluorouraci.      To help prevent nausea and vomiting after your treatment, we encourage you to take your nausea medication as directed.  BELOW ARE SYMPTOMS THAT SHOULD BE REPORTED IMMEDIATELY: *FEVER GREATER THAN 100.4 F (38 C) OR HIGHER *CHILLS OR SWEATING *NAUSEA AND VOMITING THAT IS NOT CONTROLLED WITH YOUR NAUSEA MEDICATION *UNUSUAL SHORTNESS OF BREATH *UNUSUAL BRUISING OR BLEEDING *URINARY PROBLEMS (pain or burning when urinating, or frequent urination) *BOWEL PROBLEMS (unusual diarrhea, constipation, pain near the anus) TENDERNESS IN MOUTH AND THROAT WITH OR WITHOUT PRESENCE OF ULCERS (sore throat, sores in mouth, or a toothache) UNUSUAL RASH, SWELLING OR PAIN  UNUSUAL VAGINAL DISCHARGE OR ITCHING   Items with * indicate a potential emergency and should be followed up as soon as possible or go to the Emergency Department if any problems should occur.  Please show the CHEMOTHERAPY ALERT CARD or IMMUNOTHERAPY ALERT CARD at check-in to the Emergency Department and triage nurse.  Should you have questions after your visit or need to cancel or reschedule your appointment, please contact [facility]  Dept: [phone]  and follow the prompts.  Office hours are 8:00 a.m. to 4:30 p.m. Monday - Friday.  Please note that voicemails left after 4:00 p.m. may not be returned until the following business day.  We are closed weekends and major holidays. You have access to a nurse at all times for urgent questions. Please call the main number to the clinic Dept: [phone] and follow the prompts.   For any non-urgent questions, you may also contact your provider using MyChart. We now offer e-Visits for anyone 71 and older to request care  online for non-urgent symptoms. For details visit [email or web address].   Also download the MyChart app! Go to the app store, search "MyChart", open the app, select [facility], and log in with your MyChart username and password.  Bevacizumab Injection What is this medication? BEVACIZUMAB (be va SIZ yoo mab) treats some types of cancer. It works by blocking a protein that causes cancer cells to grow and multiply. This helps to slow or stop the spread of cancer cells. It is a monoclonal antibody. This medicine may be used for other purposes; ask your health care provider or pharmacist if you have questions. COMMON BRAND NAME(S): Alymsys, Avastin, MVASI, Noah Charon What should I tell my care team before I take this medication? They need to know if you have any of these conditions: Blood clots Coughing up blood Having or recent surgery Heart failure High blood pressure History of a connection between 2 or more body parts that do not usually connect (fistula) History of a tear in your stomach or intestines Protein in your urine An unusual or allergic reaction to bevacizumab, other medications, foods, dyes, or preservatives Pregnant or trying to get pregnant Breast-feeding How should I use this medication? This medication is injected into a vein. It is given by your care team in a hospital or clinic setting. Talk to your care team the use of this medication in children. Special care may be needed. Overdosage: If you think you have taken too much of this medicine contact a poison control center or emergency room at once. NOTE: This medicine is only for you. Do not share this medicine with others. What if I miss a dose? Keep appointments for follow-up doses. It is important not to miss your dose. Call your care team if you are unable to keep an appointment. What may interact with this medication? Interactions are not expected. This list may not describe all possible interactions. Give your  health care provider a list of all the medicines, herbs, non-prescription drugs, or dietary supplements you use. Also tell them if you smoke, drink alcohol, or use illegal drugs. Some items may interact with your medicine. What should I watch for while using this medication? Your condition will be monitored carefully while you are receiving this medication. You may need blood work while taking this medication. This medication may make you feel generally unwell. This is not uncommon as chemotherapy can affect healthy cells as well as cancer cells. Report any side effects. Continue your course of treatment even though you feel ill unless your care team tells you to stop. This medication may increase your risk to bruise or bleed. Call your care team if you notice any unusual bleeding. Before having surgery, talk to your care team to make sure it is ok. This medication can increase the risk of poor healing of your surgical site or wound. You will need to stop this medication for 28 days before surgery. After surgery, wait at least 28 days before restarting this medication. Make sure the surgical site or wound is healed enough before  restarting this medication. Talk to your care team if questions. Talk to your care team if you may be pregnant. Serious birth defects can occur if you take this medication during pregnancy and for 6 months after the last dose. Contraception is recommended while taking this medication and for 6 months after the last dose. Your care team can help you find the option that works for you. Do not breastfeed while taking this medication and for 6 months after the last dose. This medication can cause infertility. Talk to your care team if you are concerned about your fertility. What side effects may I notice from receiving this medication? Side effects that you should report to your care team as soon as possible: Allergic reactions--skin rash, itching, hives, swelling of the face, lips,  tongue, or throat Bleeding--bloody or black, tar-like stools, vomiting blood or brown material that looks like coffee grounds, red or dark brown urine, small red or purple spots on skin, unusual bruising or bleeding Blood clot--pain, swelling, or warmth in the leg, shortness of breath, chest pain Heart attack--pain or tightness in the chest, shoulders, arms, or jaw, nausea, shortness of breath, cold or clammy skin, feeling faint or lightheaded Heart failure--shortness of breath, swelling of the ankles, feet, or hands, sudden weight gain, unusual weakness or fatigue Increase in blood pressure Infection--fever, chills, cough, sore throat, wounds that don't heal, pain or trouble when passing urine, general feeling of discomfort or being unwell Infusion reactions--chest pain, shortness of breath or trouble breathing, feeling faint or lightheaded Kidney injury--decrease in the amount of urine, swelling of the ankles, hands, or feet Stomach pain that is severe, does not go away, or gets worse Stroke--sudden numbness or weakness of the face, arm, or leg, trouble speaking, confusion, trouble walking, loss of balance or coordination, dizziness, severe headache, change in vision Sudden and severe headache, confusion, change in vision, seizures, which may be signs of posterior reversible encephalopathy syndrome (PRES) Side effects that usually do not require medical attention (report to your care team if they continue or are bothersome): Back pain Change in taste Diarrhea Dry skin Increased tears Nosebleed This list may not describe all possible side effects. Call your doctor for medical advice about side effects. You may report side effects to FDA at 1-800-FDA-1088. Where should I keep my medication? This medication is given in a hospital or clinic. It will not be stored at home. NOTE: This sheet is a summary. It may not cover all possible information. If you have questions about this medicine, talk to  your doctor, pharmacist, or health care provider.  2023 Elsevier/Gold Standard (2021-07-19 00:00:00)  Irinotecan Injection What is this medication? IRINOTECAN (ir in oh TEE kan) treats some types of cancer. It works by slowing down the growth of cancer cells. This medicine may be used for other purposes; ask your health care provider or pharmacist if you have questions. COMMON BRAND NAME(S): Camptosar What should I tell my care team before I take this medication? They need to know if you have any of these conditions: Dehydration Diarrhea Infection, especially a viral infection, such as chickenpox, cold sores, herpes Liver disease Low blood cell levels (white cells, red cells, and platelets) Low levels of electrolytes, such as calcium, magnesium, or potassium in your blood Recent or ongoing radiation An unusual or allergic reaction to irinotecan, other medications, foods, dyes, or preservatives If you or your partner are pregnant or trying to get pregnant Breast-feeding How should I use this medication? This medication is  injected into a vein. It is given by your care team in a hospital or clinic setting. Talk to your care team about the use of this medication in children. Special care may be needed. Overdosage: If you think you have taken too much of this medicine contact a poison control center or emergency room at once. NOTE: This medicine is only for you. Do not share this medicine with others. What if I miss a dose? Keep appointments for follow-up doses. It is important not to miss your dose. Call your care team if you are unable to keep an appointment. What may interact with this medication? Do not take this medication with any of the following: Cobicistat Itraconazole This medication may also interact with the following: Certain antibiotics, such as clarithromycin, rifampin, rifabutin Certain antivirals for HIV or AIDS Certain medications for fungal infections, such as  ketoconazole, posaconazole, voriconazole Certain medications for seizures, such as carbamazepine, phenobarbital, phenytoin Gemfibrozil Nefazodone St. John's wort This list may not describe all possible interactions. Give your health care provider a list of all the medicines, herbs, non-prescription drugs, or dietary supplements you use. Also tell them if you smoke, drink alcohol, or use illegal drugs. Some items may interact with your medicine. What should I watch for while using this medication? Your condition will be monitored carefully while you are receiving this medication. You may need blood work while taking this medication. This medication may make you feel generally unwell. This is not uncommon as chemotherapy can affect healthy cells as well as cancer cells. Report any side effects. Continue your course of treatment even though you feel ill unless your care team tells you to stop. This medication can cause serious side effects. To reduce the risk, your care team may give you other medications to take before receiving this one. Be sure to follow the directions from your care team. This medication may affect your coordination, reaction time, or judgement. Do not drive or operate machinery until you know how this medication affects you. Sit up or stand slowly to reduce the risk of dizzy or fainting spells. Drinking alcohol with this medication can increase the risk of these side effects. This medication may increase your risk of getting an infection. Call your care team for advice if you get a fever, chills, sore throat, or other symptoms of a cold or flu. Do not treat yourself. Try to avoid being around people who are sick. Avoid taking medications that contain aspirin, acetaminophen, ibuprofen, naproxen, or ketoprofen unless instructed by your care team. These medications may hide a fever. This medication may increase your risk to bruise or bleed. Call your care team if you notice any unusual  bleeding. Be careful brushing or flossing your teeth or using a toothpick because you may get an infection or bleed more easily. If you have any dental work done, tell your dentist you are receiving this medication. Talk to your care team if you or your partner are pregnant or think either of you might be pregnant. This medication can cause serious birth defects if taken during pregnancy and for 6 months after the last dose. You will need a negative pregnancy test before starting this medication. Contraception is recommended while taking this medication and for 6 months after the last dose. Your care team can help you find the option that works for you. Do not father a child while taking this medication and for 3 months after the last dose. Use a condom for contraception during this  time period. Do not breastfeed while taking this medication and for 7 days after the last dose. This medication may cause infertility. Talk to your care team if you are concerned about your fertility. What side effects may I notice from receiving this medication? Side effects that you should report to your care team as soon as possible: Allergic reactions--skin rash, itching, hives, swelling of the face, lips, tongue, or throat Dry cough, shortness of breath or trouble breathing Increased saliva or tears, increased sweating, stomach cramping, diarrhea, small pupils, unusual weakness or fatigue, slow heartbeat Infection--fever, chills, cough, sore throat, wounds that don't heal, pain or trouble when passing urine, general feeling of discomfort or being unwell Kidney injury--decrease in the amount of urine, swelling of the ankles, hands, or feet Low red blood cell level--unusual weakness or fatigue, dizziness, headache, trouble breathing Severe or prolonged diarrhea Unusual bruising or bleeding Side effects that usually do not require medical attention (report to your care team if they continue or are  bothersome): Constipation Diarrhea Hair loss Loss of appetite Nausea Stomach pain This list may not describe all possible side effects. Call your doctor for medical advice about side effects. You may report side effects to FDA at 1-800-FDA-1088. Where should I keep my medication? This medication is given in a hospital or clinic. It will not be stored at home. NOTE: This sheet is a summary. It may not cover all possible information. If you have questions about this medicine, talk to your doctor, pharmacist, or health care provider.  2023 Elsevier/Gold Standard (2021-07-25 00:00:00)  Leucovorin Injection What is this medication? LEUCOVORIN (loo koe VOR in) prevents side effects from certain medications, such as methotrexate. It works by increasing folate levels. This helps protect healthy cells in your body. It may also be used to treat anemia caused by low levels of folate. It can also be used with fluorouracil, a type of chemotherapy, to treat colorectal cancer. It works by increasing the effects of fluorouracil in the body. This medicine may be used for other purposes; ask your health care provider or pharmacist if you have questions. What should I tell my care team before I take this medication? They need to know if you have any of these conditions: Anemia from low levels of vitamin B12 in the blood An unusual or allergic reaction to leucovorin, folic acid, other medications, foods, dyes, or preservatives Pregnant or trying to get pregnant Breastfeeding How should I use this medication? This medication is injected into a vein or a muscle. It is given by your care team in a hospital or clinic setting. Talk to your care team about the use of this medication in children. Special care may be needed. Overdosage: If you think you have taken too much of this medicine contact a poison control center or emergency room at once. NOTE: This medicine is only for you. Do not share this medicine with  others. What if I miss a dose? Keep appointments for follow-up doses. It is important not to miss your dose. Call your care team if you are unable to keep an appointment. What may interact with this medication? Capecitabine Fluorouracil Phenobarbital Phenytoin Primidone Trimethoprim;sulfamethoxazole This list may not describe all possible interactions. Give your health care provider a list of all the medicines, herbs, non-prescription drugs, or dietary supplements you use. Also tell them if you smoke, drink alcohol, or use illegal drugs. Some items may interact with your medicine. What should I watch for while using this medication? Your condition  will be monitored carefully while you are receiving this medication. This medication may increase the side effects of 5-fluorouracil. Tell your care team if you have diarrhea or mouth sores that do not get better or that get worse. What side effects may I notice from receiving this medication? Side effects that you should report to your care team as soon as possible: Allergic reactions--skin rash, itching, hives, swelling of the face, lips, tongue, or throat This list may not describe all possible side effects. Call your doctor for medical advice about side effects. You may report side effects to FDA at 1-800-FDA-1088. Where should I keep my medication? This medication is given in a hospital or clinic. It will not be stored at home. NOTE: This sheet is a summary. It may not cover all possible information. If you have questions about this medicine, talk to your doctor, pharmacist, or health care provider.  2023 Elsevier/Gold Standard (2021-07-26 00:00:00)  Fluorouracil Injection What is this medication? FLUOROURACIL (flure oh YOOR a sil) treats some types of cancer. It works by slowing down the growth of cancer cells. This medicine may be used for other purposes; ask your health care provider or pharmacist if you have questions. COMMON BRAND  NAME(S): Adrucil What should I tell my care team before I take this medication? They need to know if you have any of these conditions: Blood disorders Dihydropyrimidine dehydrogenase (DPD) deficiency Infection, such as chickenpox, cold sores, herpes Kidney disease Liver disease Poor nutrition Recent or ongoing radiation therapy An unusual or allergic reaction to fluorouracil, other medications, foods, dyes, or preservatives If you or your partner are pregnant or trying to get pregnant Breast-feeding How should I use this medication? This medication is injected into a vein. It is administered by your care team in a hospital or clinic setting. Talk to your care team about the use of this medication in children. Special care may be needed. Overdosage: If you think you have taken too much of this medicine contact a poison control center or emergency room at once. NOTE: This medicine is only for you. Do not share this medicine with others. What if I miss a dose? Keep appointments for follow-up doses. It is important not to miss your dose. Call your care team if you are unable to keep an appointment. What may interact with this medication? Do not take this medication with any of the following: Live virus vaccines This medication may also interact with the following: Medications that treat or prevent blood clots, such as warfarin, enoxaparin, dalteparin This list may not describe all possible interactions. Give your health care provider a list of all the medicines, herbs, non-prescription drugs, or dietary supplements you use. Also tell them if you smoke, drink alcohol, or use illegal drugs. Some items may interact with your medicine. What should I watch for while using this medication? Your condition will be monitored carefully while you are receiving this medication. This medication may make you feel generally unwell. This is not uncommon as chemotherapy can affect healthy cells as well as  cancer cells. Report any side effects. Continue your course of treatment even though you feel ill unless your care team tells you to stop. In some cases, you may be given additional medications to help with side effects. Follow all directions for their use. This medication may increase your risk of getting an infection. Call your care team for advice if you get a fever, chills, sore throat, or other symptoms of a cold or flu.  Do not treat yourself. Try to avoid being around people who are sick. This medication may increase your risk to bruise or bleed. Call your care team if you notice any unusual bleeding. Be careful brushing or flossing your teeth or using a toothpick because you may get an infection or bleed more easily. If you have any dental work done, tell your dentist you are receiving this medication. Avoid taking medications that contain aspirin, acetaminophen, ibuprofen, naproxen, or ketoprofen unless instructed by your care team. These medications may hide a fever. Do not treat diarrhea with over the counter products. Contact your care team if you have diarrhea that lasts more than 2 days or if it is severe and watery. This medication can make you more sensitive to the sun. Keep out of the sun. If you cannot avoid being in the sun, wear protective clothing and sunscreen. Do not use sun lamps, tanning beds, or tanning booths. Talk to your care team if you or your partner wish to become pregnant or think you might be pregnant. This medication can cause serious birth defects if taken during pregnancy and for 3 months after the last dose. A reliable form of contraception is recommended while taking this medication and for 3 months after the last dose. Talk to your care team about effective forms of contraception. Do not father a child while taking this medication and for 3 months after the last dose. Use a condom while having sex during this time period. Do not breastfeed while taking this  medication. This medication may cause infertility. Talk to your care team if you are concerned about your fertility. What side effects may I notice from receiving this medication? Side effects that you should report to your care team as soon as possible: Allergic reactions--skin rash, itching, hives, swelling of the face, lips, tongue, or throat Heart attack--pain or tightness in the chest, shoulders, arms, or jaw, nausea, shortness of breath, cold or clammy skin, feeling faint or lightheaded Heart failure--shortness of breath, swelling of the ankles, feet, or hands, sudden weight gain, unusual weakness or fatigue Heart rhythm changes--fast or irregular heartbeat, dizziness, feeling faint or lightheaded, chest pain, trouble breathing High ammonia level--unusual weakness or fatigue, confusion, loss of appetite, nausea, vomiting, seizures Infection--fever, chills, cough, sore throat, wounds that don't heal, pain or trouble when passing urine, general feeling of discomfort or being unwell Low red blood cell level--unusual weakness or fatigue, dizziness, headache, trouble breathing Pain, tingling, or numbness in the hands or feet, muscle weakness, change in vision, confusion or trouble speaking, loss of balance or coordination, trouble walking, seizures Redness, swelling, and blistering of the skin over hands and feet Severe or prolonged diarrhea Unusual bruising or bleeding Side effects that usually do not require medical attention (report to your care team if they continue or are bothersome): Dry skin Headache Increased tears Nausea Pain, redness, or swelling with sores inside the mouth or throat Sensitivity to light Vomiting This list may not describe all possible side effects. Call your doctor for medical advice about side effects. You may report side effects to FDA at 1-800-FDA-1088. Where should I keep my medication? This medication is given in a hospital or clinic. It will not be stored  at home. NOTE: This sheet is a summary. It may not cover all possible information. If you have questions about this medicine, talk to your doctor, pharmacist, or health care provider.  2023 Elsevier/Gold Standard (2021-07-16 00:00:00)

## 2022-05-26 NOTE — Progress Notes (Signed)
Patient seen by Dr. Benay Spice today  Vitals are within treatment parameters.  Labs reviewed by Dr. Benay Spice and are not all within treatment parameters. Urine protein is 100.   Per physician team, patient is ready for treatment and there are NO modifications to the treatment plan. Per MD Benay Spice, ok to treat with urine protein results today.

## 2022-05-27 ENCOUNTER — Telehealth: Payer: Self-pay | Admitting: Emergency Medicine

## 2022-05-27 NOTE — Telephone Encounter (Signed)
24 Hour Callback 24 hour callback post first time Avastin and Irinotecan. (Folfiri). No answer, left voicemail message with instructions to call with any questions or concerns.

## 2022-05-28 ENCOUNTER — Inpatient Hospital Stay: Payer: BC Managed Care – PPO

## 2022-05-28 ENCOUNTER — Other Ambulatory Visit: Payer: Self-pay

## 2022-05-28 ENCOUNTER — Ambulatory Visit: Payer: BC Managed Care – PPO | Admitting: Orthopedic Surgery

## 2022-05-28 VITALS — BP 147/86 | HR 73 | Temp 97.6°F | Resp 18

## 2022-05-28 DIAGNOSIS — C7951 Secondary malignant neoplasm of bone: Secondary | ICD-10-CM | POA: Diagnosis not present

## 2022-05-28 DIAGNOSIS — Z5111 Encounter for antineoplastic chemotherapy: Secondary | ICD-10-CM | POA: Diagnosis not present

## 2022-05-28 DIAGNOSIS — Z5112 Encounter for antineoplastic immunotherapy: Secondary | ICD-10-CM | POA: Diagnosis not present

## 2022-05-28 DIAGNOSIS — C186 Malignant neoplasm of descending colon: Secondary | ICD-10-CM | POA: Diagnosis not present

## 2022-05-28 DIAGNOSIS — Z79899 Other long term (current) drug therapy: Secondary | ICD-10-CM | POA: Diagnosis not present

## 2022-05-28 MED ORDER — SODIUM CHLORIDE 0.9% FLUSH
10.0000 mL | INTRAVENOUS | Status: DC | PRN
  Administered 2022-05-28: 10 mL

## 2022-05-28 MED ORDER — HEPARIN SOD (PORK) LOCK FLUSH 100 UNIT/ML IV SOLN
500.0000 [IU] | Freq: Once | INTRAVENOUS | Status: AC | PRN
  Administered 2022-05-28: 500 [IU]

## 2022-05-28 NOTE — Patient Instructions (Signed)

## 2022-05-28 NOTE — Progress Notes (Signed)
Patient was in earlier for chemo pump stop, stated his wife was diagnosed with Shingles yesterday and wanted to know what we advised him to do. Instructed patient to monitor for symptoms (fever/chills, painful rash, etc). Instructed patient to avoid physical contact with wife while she is contagious (until the rash scabs over). Patient asked if he should sleep in a different room than his wife, and this RN confirmed that would be a good idea. Instructed patient to wear a mask when around wife due to shingles being an airborne illness. Instructed patient to contact office with any signs/symptoms of infection. Dr. Benay Spice made aware. No new orders at this time.

## 2022-06-04 DIAGNOSIS — C186 Malignant neoplasm of descending colon: Secondary | ICD-10-CM | POA: Diagnosis not present

## 2022-06-08 ENCOUNTER — Other Ambulatory Visit: Payer: Self-pay | Admitting: Oncology

## 2022-06-09 ENCOUNTER — Inpatient Hospital Stay: Payer: BC Managed Care – PPO

## 2022-06-09 ENCOUNTER — Inpatient Hospital Stay: Payer: BC Managed Care – PPO | Attending: Oncology | Admitting: Nurse Practitioner

## 2022-06-09 ENCOUNTER — Encounter: Payer: Self-pay | Admitting: Nurse Practitioner

## 2022-06-09 VITALS — BP 142/90

## 2022-06-09 VITALS — BP 134/88 | HR 64 | Temp 98.1°F | Resp 18 | Ht 72.0 in | Wt 220.0 lb

## 2022-06-09 DIAGNOSIS — Z79634 Long term (current) use of topoisomerase inhibitor: Secondary | ICD-10-CM | POA: Insufficient documentation

## 2022-06-09 DIAGNOSIS — C186 Malignant neoplasm of descending colon: Secondary | ICD-10-CM | POA: Diagnosis not present

## 2022-06-09 DIAGNOSIS — C7951 Secondary malignant neoplasm of bone: Secondary | ICD-10-CM | POA: Insufficient documentation

## 2022-06-09 DIAGNOSIS — R809 Proteinuria, unspecified: Secondary | ICD-10-CM | POA: Diagnosis not present

## 2022-06-09 DIAGNOSIS — Z79631 Long term (current) use of antimetabolite agent: Secondary | ICD-10-CM | POA: Insufficient documentation

## 2022-06-09 DIAGNOSIS — M549 Dorsalgia, unspecified: Secondary | ICD-10-CM | POA: Insufficient documentation

## 2022-06-09 DIAGNOSIS — Z5111 Encounter for antineoplastic chemotherapy: Secondary | ICD-10-CM | POA: Insufficient documentation

## 2022-06-09 DIAGNOSIS — Z95828 Presence of other vascular implants and grafts: Secondary | ICD-10-CM

## 2022-06-09 DIAGNOSIS — Z5112 Encounter for antineoplastic immunotherapy: Secondary | ICD-10-CM | POA: Insufficient documentation

## 2022-06-09 LAB — CBC WITH DIFFERENTIAL (CANCER CENTER ONLY)
Abs Immature Granulocytes: 0.01 10*3/uL (ref 0.00–0.07)
Basophils Absolute: 0 10*3/uL (ref 0.0–0.1)
Basophils Relative: 1 %
Eosinophils Absolute: 0.3 10*3/uL (ref 0.0–0.5)
Eosinophils Relative: 8 %
HCT: 36.9 % — ABNORMAL LOW (ref 39.0–52.0)
Hemoglobin: 12.7 g/dL — ABNORMAL LOW (ref 13.0–17.0)
Immature Granulocytes: 0 %
Lymphocytes Relative: 21 %
Lymphs Abs: 0.8 10*3/uL (ref 0.7–4.0)
MCH: 31.3 pg (ref 26.0–34.0)
MCHC: 34.4 g/dL (ref 30.0–36.0)
MCV: 90.9 fL (ref 80.0–100.0)
Monocytes Absolute: 0.3 10*3/uL (ref 0.1–1.0)
Monocytes Relative: 8 %
Neutro Abs: 2.4 10*3/uL (ref 1.7–7.7)
Neutrophils Relative %: 62 %
Platelet Count: 126 10*3/uL — ABNORMAL LOW (ref 150–400)
RBC: 4.06 MIL/uL — ABNORMAL LOW (ref 4.22–5.81)
RDW: 13.2 % (ref 11.5–15.5)
WBC Count: 3.8 10*3/uL — ABNORMAL LOW (ref 4.0–10.5)
nRBC: 0 % (ref 0.0–0.2)

## 2022-06-09 LAB — CMP (CANCER CENTER ONLY)
ALT: 14 U/L (ref 0–44)
AST: 14 U/L — ABNORMAL LOW (ref 15–41)
Albumin: 3.7 g/dL (ref 3.5–5.0)
Alkaline Phosphatase: 475 U/L — ABNORMAL HIGH (ref 38–126)
Anion gap: 8 (ref 5–15)
BUN: 17 mg/dL (ref 6–20)
CO2: 23 mmol/L (ref 22–32)
Calcium: 8.6 mg/dL — ABNORMAL LOW (ref 8.9–10.3)
Chloride: 105 mmol/L (ref 98–111)
Creatinine: 0.95 mg/dL (ref 0.61–1.24)
GFR, Estimated: >60 mL/min (ref >60)
Glucose, Bld: 202 mg/dL — ABNORMAL HIGH (ref 70–99)
Potassium: 4.3 mmol/L (ref 3.5–5.1)
Sodium: 136 mmol/L (ref 135–145)
Total Bilirubin: 0.5 mg/dL (ref 0.3–1.2)
Total Protein: 6.3 g/dL — ABNORMAL LOW (ref 6.5–8.1)

## 2022-06-09 MED ORDER — SODIUM CHLORIDE 0.9% FLUSH
10.0000 mL | INTRAVENOUS | Status: DC | PRN
  Administered 2022-06-09: 10 mL via INTRAVENOUS

## 2022-06-09 MED ORDER — SODIUM CHLORIDE 0.9 % IV SOLN
2400.0000 mg/m2 | INTRAVENOUS | Status: DC
  Administered 2022-06-09: 5000 mg via INTRAVENOUS
  Filled 2022-06-09: qty 100

## 2022-06-09 MED ORDER — SODIUM CHLORIDE 0.9 % IV SOLN
400.0000 mg/m2 | Freq: Once | INTRAVENOUS | Status: AC
  Administered 2022-06-09: 900 mg via INTRAVENOUS
  Filled 2022-06-09: qty 45

## 2022-06-09 MED ORDER — ATROPINE SULFATE 1 MG/ML IV SOLN
0.5000 mg | Freq: Once | INTRAVENOUS | Status: AC | PRN
  Administered 2022-06-09: 0.5 mg via INTRAVENOUS
  Filled 2022-06-09: qty 1

## 2022-06-09 MED ORDER — PALONOSETRON HCL INJECTION 0.25 MG/5ML
0.2500 mg | Freq: Once | INTRAVENOUS | Status: AC
  Administered 2022-06-09: 0.25 mg via INTRAVENOUS
  Filled 2022-06-09: qty 5

## 2022-06-09 MED ORDER — SODIUM CHLORIDE 0.9 % IV SOLN
5.0000 mg/kg | Freq: Once | INTRAVENOUS | Status: AC
  Administered 2022-06-09: 500 mg via INTRAVENOUS
  Filled 2022-06-09: qty 16

## 2022-06-09 MED ORDER — SODIUM CHLORIDE 0.9 % IV SOLN: Freq: Once | INTRAVENOUS | Status: AC

## 2022-06-09 MED ORDER — SODIUM CHLORIDE 0.9 % IV SOLN
180.0000 mg/m2 | Freq: Once | INTRAVENOUS | Status: AC
  Administered 2022-06-09: 400 mg via INTRAVENOUS
  Filled 2022-06-09: qty 20

## 2022-06-09 MED ORDER — SODIUM CHLORIDE 0.9 % IV SOLN
10.0000 mg | Freq: Once | INTRAVENOUS | Status: AC
  Administered 2022-06-09: 10 mg via INTRAVENOUS
  Filled 2022-06-09: qty 1

## 2022-06-09 MED ORDER — FLUOROURACIL CHEMO INJECTION 2.5 GM/50ML
400.0000 mg/m2 | Freq: Once | INTRAVENOUS | Status: AC
  Administered 2022-06-09: 1000 mg via INTRAVENOUS
  Filled 2022-06-09: qty 20

## 2022-06-09 NOTE — Patient Instructions (Signed)
[facility]  Discharge Instructions: Thank you for choosing [facility] to provide your oncology and hematology care.   If you have a lab appointment with the [facility], please go directly to the [facility] and check in at the registration area.   Wear comfortable clothing and clothing appropriate for easy access to any Portacath or PICC line.   We strive to give you quality time with your provider. You may need to reschedule your appointment if you arrive late (15 or more minutes).  Arriving late affects you and other patients whose appointments are after yours.  Also, if you miss three or more appointments without notifying the office, you may be dismissed from the clinic at the provider's discretion.      For prescription refill requests, have your pharmacy contact our office and allow 72 hours for refills to be completed.    Today you received the following chemotherapy and/or immunotherapy agents Avastin, Irinotecan, Leucovorin and 5FU.      To help prevent nausea and vomiting after your treatment, we encourage you to take your nausea medication as directed.  BELOW ARE SYMPTOMS THAT SHOULD BE REPORTED IMMEDIATELY: *FEVER GREATER THAN 100.4 F (38 C) OR HIGHER *CHILLS OR SWEATING *NAUSEA AND VOMITING THAT IS NOT CONTROLLED WITH YOUR NAUSEA MEDICATION *UNUSUAL SHORTNESS OF BREATH *UNUSUAL BRUISING OR BLEEDING *URINARY PROBLEMS (pain or burning when urinating, or frequent urination) *BOWEL PROBLEMS (unusual diarrhea, constipation, pain near the anus) TENDERNESS IN MOUTH AND THROAT WITH OR WITHOUT PRESENCE OF ULCERS (sore throat, sores in mouth, or a toothache) UNUSUAL RASH, SWELLING OR PAIN  UNUSUAL VAGINAL DISCHARGE OR ITCHING   Items with * indicate a potential emergency and should be followed up as soon as possible or go to the Emergency Department if any problems should occur.  Please show the CHEMOTHERAPY ALERT CARD or  IMMUNOTHERAPY ALERT CARD at check-in to the Emergency Department and triage nurse.  Should you have questions after your visit or need to cancel or reschedule your appointment, please contact [facility]  Dept: [phone]  and follow the prompts.  Office hours are 8:00 a.m. to 4:30 p.m. Monday - Friday. Please note that voicemails left after 4:00 p.m. may not be returned until the following business day.  We are closed weekends and major holidays. You have access to a nurse at all times for urgent questions. Please call the main number to the clinic Dept: [phone] and follow the prompts.   For any non-urgent questions, you may also contact your provider using MyChart. We now offer e-Visits for anyone 89 and older to request care online for non-urgent symptoms. For details visit [email or web address].   Also download the MyChart app! Go to the app store, search "MyChart", open the app, select [facility], and log in with your MyChart username and password.

## 2022-06-09 NOTE — Progress Notes (Signed)
Patient seen by Lisa Thomas NP today  Vitals are within treatment parameters.  Labs reviewed by Lisa Thomas NP and are within treatment parameters.  Per physician team, patient is ready for treatment and there are NO modifications to the treatment plan.     

## 2022-06-09 NOTE — Progress Notes (Signed)
[facility] OFFICE PROGRESS NOTE   Diagnosis: Colon cancer  INTERVAL HISTORY:   [name] returns as scheduled.  He completed cycle 1 FOLFIRI/bevacizumab 05/26/2022.  He had a few episodes of mild intermittent nausea.  No mouth sores.  No diarrhea.  No hand or foot pain or redness.  He has noted a small amount of blood after nose blowing a couple of times.  He has stable mild exertional dyspnea.  Objective:  Vital signs in last 24 hours:  Blood pressure 134/88, pulse 64, temperature 98.1 F (36.7 C), temperature source Oral, resp. rate 18, height 6' (1.829 m), weight 220 lb (99.8 kg), SpO2 100 %.    HEENT: No thrush or ulcers. Resp: Lungs clear bilaterally. Cardio: Regular rate and rhythm. GI: Abdomen soft and nontender.  No hepatosplenomegaly. Vascular: No leg edema. Skin: Palms without erythema. Port-A-Cath without erythema.  Lab Results:  Lab Results  Component Value Date   WBC 3.8 (L) 06/09/2022   HGB 12.7 (L) 06/09/2022   HCT 36.9 (L) 06/09/2022   MCV 90.9 06/09/2022   PLT 126 (L) 06/09/2022   NEUTROABS 2.4 06/09/2022    Imaging:  No results found.  Medications: I have reviewed the patient's current medications.  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, status post a left colectomy 03/04/2019 Mass felt to be in the transverse colon on a colonoscopy 01/07/2019 with a biopsy confirming poorly differentiated adenocarcinoma with signet ring cell features, mismatch repair protein expression normal CT abdomen/pelvis 01/08/2019 12.3 cm indeterminate splenic mass, present in 2015 on a lumbar MRI-felt to be benign, small subpleural and pleural-based nodules at the lung bases, status post left nephrectomy CT chest 01/20/2019-small bilateral pulmonary nodules with rounded parenchymal nodules in the right lower lobe, indeterminate splenic mass CT  chest 03/18/2019-multiple subcentimeter pulmonary nodules again seen bilaterally, greatest in the lower lobes, without significant change.  No new or enlarging pulmonary nodules or masses.  Partially visualized large heterogeneous splenic mass with no significant change.  Plan for follow-up chest CT at a 58-monthinterval. Cycle 1 FOLFOX 03/21/2019 Cycle 2 FOLFOX 04/04/2019 Cycle 3 FOLFOX 04/18/2019 (oxaliplatin dose reduced secondary to thrombocytopenia) Cycle 4 FOLFOX 05/02/2019 (oxaliplatin held secondary to thrombocytopenia) Cycle 5 FOLFOX 05/16/2019 Cycle 6 FOLFOX 05/30/2019 (oxaliplatin held secondary to thrombocytopenia) CT chest 06/08/2019-stable scattered small solid pulmonary nodules  Cycle 7 FOLFOX 06/13/2019 Cycle 8 FOLFOX 06/27/2019 Cycle 9 FOLFOX 07/11/2019 Cycle 10 FOLFOX 07/25/2019 (oxaliplatin held due to thrombocytopenia) Cycle 11 FOLFOX 08/08/2019 Cycle 12 FOLFOX 08/22/2019 CTs 01/09/2020-no evidence of recurrent disease, stable lung nodules favored to represent subpleural lymph nodes-dedicated follow-up not recommended CTs 01/11/2021-no evidence of recurrent disease CTs 01/24/2022-no evidence of recurrent disease CT lumbar spine, abdomen/pelvis 04/11/2022-numerous mixed lytic and sclerotic lesions scattered throughout the visualized axial and appendicular skeleton including several areas in the lumbar spine.   04/21/2022 CT biopsy sclerotic lesion right anterior iliac bone-consistent with metastatic colorectal adenocarcinoma, moderate to poorly differentiated.  Foundation 1-microsatellite stable, tumor mutation burden 4, K-ras G12S PET scan [id number] mixed faintly lytic and sclerotic osseous metastases throughout the axial and proximal appendicular skeleton; associated mild pathologic compression fracture at L3; no hypermetabolic extraosseous metastatic disease. 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  Left nephrectomy at age 37 12.  Port-A-Cath placement, Dr. Johney Maine, 03/16/2019 13.  Thrombocytopenia secondary to chemotherapy-oxaliplatin dose reduced beginning with cycle 3 FOLFOX, improved 14.  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  Right groin soft tissue infection May 2023-status postsurgical debridement 18.  Left leg DVT-common femoral, femoral, popliteal, posterior tibial, and peroneal veins 19.  02/04/2022-MRI cervical spine-1.7 cm T1 lesion-indeterminate; bone scan 03/14/2022-focal intense activity at approximate T1 level felt to correspond to the lesion on MRI, additional foci of activity involving the lower thoracic and lumbar spine, right iliac bone and pubic symphysis.  CT lumbar spine, abdomen/pelvis 04/11/2022-numerous mixed lytic and sclerotic lesions scattered throughout the visualized axial and appendicular skeleton including several areas in the lumbar spine.  04/21/2022 CT biopsy sclerotic lesion right anterior iliac bone-consistent with metastatic colorectal adenocarcinoma, moderate to poorly differentiated    Disposition: [name] appears stable.  He has completed 1 cycle of FOLFIRI/bevacizumab.  Overall he tolerated well.  Plan to proceed with cycle 2 today as scheduled.  CBC and chemistry panel from today  reviewed.  Labs adequate to proceed as above.  He will return for lab, follow-up, cycle 3 FOLFIRI/bevacizumab in 2 weeks.  He will contact the office in the interim with any problems.    Ned Card ANP/GNP-BC   06/09/2022  8:51 AM

## 2022-06-11 ENCOUNTER — Inpatient Hospital Stay: Payer: BC Managed Care – PPO

## 2022-06-11 VITALS — BP 133/83 | HR 70 | Temp 98.9°F | Resp 18

## 2022-06-11 DIAGNOSIS — C186 Malignant neoplasm of descending colon: Secondary | ICD-10-CM

## 2022-06-11 DIAGNOSIS — Z5112 Encounter for antineoplastic immunotherapy: Secondary | ICD-10-CM | POA: Diagnosis not present

## 2022-06-11 DIAGNOSIS — Z79631 Long term (current) use of antimetabolite agent: Secondary | ICD-10-CM | POA: Diagnosis not present

## 2022-06-11 DIAGNOSIS — C7951 Secondary malignant neoplasm of bone: Secondary | ICD-10-CM | POA: Diagnosis not present

## 2022-06-11 DIAGNOSIS — M549 Dorsalgia, unspecified: Secondary | ICD-10-CM | POA: Diagnosis not present

## 2022-06-11 DIAGNOSIS — Z5111 Encounter for antineoplastic chemotherapy: Secondary | ICD-10-CM | POA: Diagnosis not present

## 2022-06-11 DIAGNOSIS — R809 Proteinuria, unspecified: Secondary | ICD-10-CM | POA: Diagnosis not present

## 2022-06-11 DIAGNOSIS — Z79634 Long term (current) use of topoisomerase inhibitor: Secondary | ICD-10-CM | POA: Diagnosis not present

## 2022-06-11 MED ORDER — SODIUM CHLORIDE 0.9% FLUSH
10.0000 mL | INTRAVENOUS | Status: DC | PRN
  Administered 2022-06-11: 10 mL

## 2022-06-11 MED ORDER — HEPARIN SOD (PORK) LOCK FLUSH 100 UNIT/ML IV SOLN
500.0000 [IU] | Freq: Once | INTRAVENOUS | Status: AC | PRN
  Administered 2022-06-11: 500 [IU]

## 2022-06-11 NOTE — Patient Instructions (Signed)

## 2022-06-22 ENCOUNTER — Other Ambulatory Visit: Payer: Self-pay | Admitting: Oncology

## 2022-06-24 ENCOUNTER — Encounter: Payer: Self-pay | Admitting: *Deleted

## 2022-06-24 ENCOUNTER — Inpatient Hospital Stay: Payer: BC Managed Care – PPO

## 2022-06-24 ENCOUNTER — Telehealth: Payer: Self-pay | Admitting: *Deleted

## 2022-06-24 ENCOUNTER — Encounter: Payer: Self-pay | Admitting: Oncology

## 2022-06-24 ENCOUNTER — Inpatient Hospital Stay (HOSPITAL_BASED_OUTPATIENT_CLINIC_OR_DEPARTMENT_OTHER): Payer: BC Managed Care – PPO | Admitting: Oncology

## 2022-06-24 VITALS — BP 132/85 | HR 73

## 2022-06-24 VITALS — BP 137/89 | HR 84 | Temp 98.2°F | Resp 18 | Ht 72.0 in | Wt 216.0 lb

## 2022-06-24 DIAGNOSIS — C186 Malignant neoplasm of descending colon: Secondary | ICD-10-CM

## 2022-06-24 DIAGNOSIS — Z5111 Encounter for antineoplastic chemotherapy: Secondary | ICD-10-CM | POA: Diagnosis not present

## 2022-06-24 DIAGNOSIS — Z5112 Encounter for antineoplastic immunotherapy: Secondary | ICD-10-CM | POA: Diagnosis not present

## 2022-06-24 DIAGNOSIS — C7951 Secondary malignant neoplasm of bone: Secondary | ICD-10-CM | POA: Diagnosis not present

## 2022-06-24 DIAGNOSIS — Z79634 Long term (current) use of topoisomerase inhibitor: Secondary | ICD-10-CM | POA: Diagnosis not present

## 2022-06-24 DIAGNOSIS — R809 Proteinuria, unspecified: Secondary | ICD-10-CM | POA: Diagnosis not present

## 2022-06-24 DIAGNOSIS — M549 Dorsalgia, unspecified: Secondary | ICD-10-CM | POA: Diagnosis not present

## 2022-06-24 DIAGNOSIS — Z79631 Long term (current) use of antimetabolite agent: Secondary | ICD-10-CM | POA: Diagnosis not present

## 2022-06-24 LAB — URINALYSIS, COMPLETE (UACMP) WITH MICROSCOPIC
Bacteria, UA: NONE SEEN
Bilirubin Urine: NEGATIVE
Glucose, UA: NEGATIVE mg/dL
Leukocytes,Ua: NEGATIVE
Nitrite: NEGATIVE
Protein, ur: 30 mg/dL — AB
Specific Gravity, Urine: 1.028 (ref 1.005–1.030)
pH: 5.5 (ref 5.0–8.0)

## 2022-06-24 LAB — CMP (CANCER CENTER ONLY)
ALT: 15 U/L (ref 10–47)
AST: 16 U/L (ref 11–38)
Albumin: 3.9 g/dL (ref 3.5–5.0)
Alkaline Phosphatase: 556 U/L — ABNORMAL HIGH (ref 38–126)
Anion gap: 6 (ref 5–15)
BUN: 18 mg/dL (ref 6–20)
CO2: 26 mmol/L (ref 22–32)
Calcium: 9.1 mg/dL (ref 8.9–10.3)
Chloride: 103 mmol/L (ref 98–111)
Creatinine: 1.07 mg/dL (ref 0.60–1.20)
GFR, Estimated: >60 mL/min (ref >60)
Glucose, Bld: 215 mg/dL — ABNORMAL HIGH (ref 70–99)
Potassium: 4.6 mmol/L (ref 3.5–5.1)
Sodium: 135 mmol/L (ref 135–145)
Total Bilirubin: 0.8 mg/dL (ref 0.2–1.6)
Total Protein: 6.7 g/dL (ref 6.5–8.1)

## 2022-06-24 LAB — CBC WITH DIFFERENTIAL (CANCER CENTER ONLY)
Abs Immature Granulocytes: 0.01 10*3/uL (ref 0.00–0.07)
Basophils Absolute: 0 10*3/uL (ref 0.0–0.1)
Basophils Relative: 1 %
Eosinophils Absolute: 0.2 10*3/uL (ref 0.0–0.5)
Eosinophils Relative: 6 %
HCT: 39.1 % (ref 39.0–52.0)
Hemoglobin: 13.4 g/dL (ref 13.0–17.0)
Immature Granulocytes: 0 %
Lymphocytes Relative: 23 %
Lymphs Abs: 0.8 10*3/uL (ref 0.7–4.0)
MCH: 31.6 pg (ref 26.0–34.0)
MCHC: 34.3 g/dL (ref 30.0–36.0)
MCV: 92.2 fL (ref 80.0–100.0)
Monocytes Absolute: 0.4 10*3/uL (ref 0.1–1.0)
Monocytes Relative: 10 %
Neutro Abs: 2.2 10*3/uL (ref 1.7–7.7)
Neutrophils Relative %: 60 %
Platelet Count: 150 10*3/uL (ref 150–400)
RBC: 4.24 MIL/uL (ref 4.22–5.81)
RDW: 14.4 % (ref 11.5–15.5)
WBC Count: 3.7 10*3/uL — ABNORMAL LOW (ref 4.0–10.5)
nRBC: 0 % (ref 0.0–0.2)

## 2022-06-24 LAB — TOTAL PROTEIN, URINE DIPSTICK: Protein, ur: >300 mg/dL — AB

## 2022-06-24 LAB — CEA (ACCESS): CEA (CHCC): 3.5 ng/mL (ref 0.00–5.00)

## 2022-06-24 MED ORDER — SODIUM CHLORIDE 0.9 % IV SOLN
400.0000 mg/m2 | Freq: Once | INTRAVENOUS | Status: AC
  Administered 2022-06-24: 900 mg via INTRAVENOUS
  Filled 2022-06-24: qty 45

## 2022-06-24 MED ORDER — ATROPINE SULFATE 1 MG/ML IV SOLN
0.5000 mg | Freq: Once | INTRAVENOUS | Status: AC | PRN
  Administered 2022-06-24: 0.5 mg via INTRAVENOUS
  Filled 2022-06-24: qty 1

## 2022-06-24 MED ORDER — PALONOSETRON HCL INJECTION 0.25 MG/5ML
0.2500 mg | Freq: Once | INTRAVENOUS | Status: AC
  Administered 2022-06-24: 0.25 mg via INTRAVENOUS
  Filled 2022-06-24: qty 5

## 2022-06-24 MED ORDER — SODIUM CHLORIDE 0.9 % IV SOLN
180.0000 mg/m2 | Freq: Once | INTRAVENOUS | Status: AC
  Administered 2022-06-24: 400 mg via INTRAVENOUS
  Filled 2022-06-24: qty 20

## 2022-06-24 MED ORDER — SODIUM CHLORIDE 0.9 % IV SOLN: Freq: Once | INTRAVENOUS | Status: AC

## 2022-06-24 MED ORDER — SODIUM CHLORIDE 0.9 % IV SOLN
2400.0000 mg/m2 | INTRAVENOUS | Status: DC
  Administered 2022-06-24: 5000 mg via INTRAVENOUS
  Filled 2022-06-24: qty 100

## 2022-06-24 MED ORDER — SODIUM CHLORIDE 0.9 % IV SOLN
10.0000 mg | Freq: Once | INTRAVENOUS | Status: AC
  Administered 2022-06-24: 10 mg via INTRAVENOUS
  Filled 2022-06-24: qty 10

## 2022-06-24 MED ORDER — FLUOROURACIL CHEMO INJECTION 2.5 GM/50ML
400.0000 mg/m2 | Freq: Once | INTRAVENOUS | Status: AC
  Administered 2022-06-24: 900 mg via INTRAVENOUS
  Filled 2022-06-24: qty 18

## 2022-06-24 NOTE — Addendum Note (Signed)
Addended by: Tania Ade on: 06/24/2022 10:10 AM   Modules accepted: Orders

## 2022-06-24 NOTE — Progress Notes (Signed)
OK to adjust 5FU bolus dose using most recent BSA = 2.23 m2 per Dr. Benay Spice.  Kennith Center, Pharm.D., CPP 06/24/2022@10 :22 AM

## 2022-06-24 NOTE — Patient Instructions (Addendum)
[place]  The chemotherapy medication bag should finish at 46 hours, 96 hours, or 7 days. For example, if your pump is scheduled for 46 hours and it was put on at 4:00 p.m., it should finish at 2:00 p.m. the day it is scheduled to come off regardless of your appointment time.     Estimated time to finish at 11:30 Thursday, June 26, 2022.   If the display on your pump reads "Low Volume" and it is beeping, take the batteries out of the pump and come to the cancer center for it to be taken off.   If the pump alarms go off prior to the pump reading "Low Volume" then call [phone] and someone can assist you.  If the plunger comes out and the chemotherapy medication is leaking out, please use your home chemo spill kit to clean up the spill. Do NOT use paper towels or other household products.  If you have problems or questions regarding your pump, please call either [phone] (24 hours a day) or the cancer center Monday-Friday 8:00 a.m.- 4:30 p.m. at the clinic number and we will assist you. If you are unable to get assistance, then go to the nearest Emergency Department and ask the staff to contact the IV team for assistance.   Discharge Instructions: Thank you for choosing [facility] to provide your oncology and hematology care.   If you have a lab appointment with the [facility], please go directly to the [facility] and check in at the registration area.   Wear comfortable clothing and clothing appropriate for easy access to any Portacath or PICC line.   We strive to give you quality time with your provider. You may need to reschedule your appointment if you arrive late (15 or more minutes).  Arriving late affects you and other patients whose appointments are after yours.  Also, if you miss three or more appointments without notifying the office, you may be dismissed from the clinic at the provider's discretion.      For  prescription refill requests, have your pharmacy contact our office and allow 72 hours for refills to be completed.    Today you received the following chemotherapy and/or immunotherapy agents Irinotecan, Leucovorin, Fluorouracil.      To help prevent nausea and vomiting after your treatment, we encourage you to take your nausea medication as directed.  BELOW ARE SYMPTOMS THAT SHOULD BE REPORTED IMMEDIATELY: *FEVER GREATER THAN 100.4 F (38 C) OR HIGHER *CHILLS OR SWEATING *NAUSEA AND VOMITING THAT IS NOT CONTROLLED WITH YOUR NAUSEA MEDICATION *UNUSUAL SHORTNESS OF BREATH *UNUSUAL BRUISING OR BLEEDING *URINARY PROBLEMS (pain or burning when urinating, or frequent urination) *BOWEL PROBLEMS (unusual diarrhea, constipation, pain near the anus) TENDERNESS IN MOUTH AND THROAT WITH OR WITHOUT PRESENCE OF ULCERS (sore throat, sores in mouth, or a toothache) UNUSUAL RASH, SWELLING OR PAIN  UNUSUAL VAGINAL DISCHARGE OR ITCHING   Items with * indicate a potential emergency and should be followed up as soon as possible or go to the Emergency Department if any problems should occur.  Please show the CHEMOTHERAPY ALERT CARD or IMMUNOTHERAPY ALERT CARD at check-in to the Emergency Department and triage nurse.  Should you have questions after your visit or need to cancel or reschedule your appointment, please contact [facility]  Dept: [phone]  and follow the prompts.  Office hours are 8:00 a.m. to 4:30 p.m. Monday - Friday. Please note  that voicemails left after 4:00 p.m. may not be returned until the following business day.  We are closed weekends and major holidays. You have access to a nurse at all times for urgent questions. Please call the main number to the clinic Dept: [phone] and follow the prompts.   For any non-urgent questions, you may also contact your provider using MyChart. We now offer e-Visits for anyone 91 and older to request care online for  non-urgent symptoms. For details visit [email or web address].   Also download the MyChart app! Go to the app store, search "MyChart", open the app, select [facility], and log in with your MyChart username and password.  Irinotecan Injection What is this medication? IRINOTECAN (ir in oh TEE kan) treats some types of cancer. It works by slowing down the growth of cancer cells. This medicine may be used for other purposes; ask your health care provider or pharmacist if you have questions. COMMON BRAND NAME(S): Camptosar What should I tell my care team before I take this medication? They need to know if you have any of these conditions: Dehydration Diarrhea Infection, especially a viral infection, such as chickenpox, cold sores, herpes Liver disease Low blood cell levels (white cells, red cells, and platelets) Low levels of electrolytes, such as calcium, magnesium, or potassium in your blood Recent or ongoing radiation An unusual or allergic reaction to irinotecan, other medications, foods, dyes, or preservatives If you or your partner are pregnant or trying to get pregnant Breast-feeding How should I use this medication? This medication is injected into a vein. It is given by your care team in a hospital or clinic setting. Talk to your care team about the use of this medication in children. Special care may be needed. Overdosage: If you think you have taken too much of this medicine contact a poison control center or emergency room at once. NOTE: This medicine is only for you. Do not share this medicine with others. What if I miss a dose? Keep appointments for follow-up doses. It is important not to miss your dose. Call your care team if you are unable to keep an appointment. What may interact with this medication? Do not take this medication with any of the following: Cobicistat Itraconazole This medication may also interact with the following: Certain antibiotics, such as  clarithromycin, rifampin, rifabutin Certain antivirals for HIV or AIDS Certain medications for fungal infections, such as ketoconazole, posaconazole, voriconazole Certain medications for seizures, such as carbamazepine, phenobarbital, phenytoin Gemfibrozil Nefazodone St. John's wort This list may not describe all possible interactions. Give your health care provider a list of all the medicines, herbs, non-prescription drugs, or dietary supplements you use. Also tell them if you smoke, drink alcohol, or use illegal drugs. Some items may interact with your medicine. What should I watch for while using this medication? Your condition will be monitored carefully while you are receiving this medication. You may need blood work while taking this medication. This medication may make you feel generally unwell. This is not uncommon as chemotherapy can affect healthy cells as well as cancer cells. Report any side effects. Continue your course of treatment even though you feel ill unless your care team tells you to stop. This medication can cause serious side effects. To reduce the risk, your care team may give you other medications to take before receiving this one. Be sure to follow the directions from your care team. This medication may affect your coordination, reaction time, or judgement. Do not drive or  operate machinery until you know how this medication affects you. Sit up or stand slowly to reduce the risk of dizzy or fainting spells. Drinking alcohol with this medication can increase the risk of these side effects. This medication may increase your risk of getting an infection. Call your care team for advice if you get a fever, chills, sore throat, or other symptoms of a cold or flu. Do not treat yourself. Try to avoid being around people who are sick. Avoid taking medications that contain aspirin, acetaminophen, ibuprofen, naproxen, or ketoprofen unless instructed by your care team. These medications  may hide a fever. This medication may increase your risk to bruise or bleed. Call your care team if you notice any unusual bleeding. Be careful brushing or flossing your teeth or using a toothpick because you may get an infection or bleed more easily. If you have any dental work done, tell your dentist you are receiving this medication. Talk to your care team if you or your partner are pregnant or think either of you might be pregnant. This medication can cause serious birth defects if taken during pregnancy and for 6 months after the last dose. You will need a negative pregnancy test before starting this medication. Contraception is recommended while taking this medication and for 6 months after the last dose. Your care team can help you find the option that works for you. Do not father a child while taking this medication and for 3 months after the last dose. Use a condom for contraception during this time period. Do not breastfeed while taking this medication and for 7 days after the last dose. This medication may cause infertility. Talk to your care team if you are concerned about your fertility. What side effects may I notice from receiving this medication? Side effects that you should report to your care team as soon as possible: Allergic reactions--skin rash, itching, hives, swelling of the face, lips, tongue, or throat Dry cough, shortness of breath or trouble breathing Increased saliva or tears, increased sweating, stomach cramping, diarrhea, small pupils, unusual weakness or fatigue, slow heartbeat Infection--fever, chills, cough, sore throat, wounds that don't heal, pain or trouble when passing urine, general feeling of discomfort or being unwell Kidney injury--decrease in the amount of urine, swelling of the ankles, hands, or feet Low red blood cell level--unusual weakness or fatigue, dizziness, headache, trouble breathing Severe or prolonged diarrhea Unusual bruising or bleeding Side  effects that usually do not require medical attention (report to your care team if they continue or are bothersome): Constipation Diarrhea Hair loss Loss of appetite Nausea Stomach pain This list may not describe all possible side effects. Call your doctor for medical advice about side effects. You may report side effects to FDA at 1-800-FDA-1088. Where should I keep my medication? This medication is given in a hospital or clinic. It will not be stored at home. NOTE: This sheet is a summary. It may not cover all possible information. If you have questions about this medicine, talk to your doctor, pharmacist, or health care provider.  2023 Elsevier/Gold Standard (2021-07-25 00:00:00)  Leucovorin Injection What is this medication? LEUCOVORIN (loo koe VOR in) prevents side effects from certain medications, such as methotrexate. It works by increasing folate levels. This helps protect healthy cells in your body. It may also be used to treat anemia caused by low levels of folate. It can also be used with fluorouracil, a type of chemotherapy, to treat colorectal cancer. It works by increasing the  effects of fluorouracil in the body. This medicine may be used for other purposes; ask your health care provider or pharmacist if you have questions. What should I tell my care team before I take this medication? They need to know if you have any of these conditions: Anemia from low levels of vitamin B12 in the blood An unusual or allergic reaction to leucovorin, folic acid, other medications, foods, dyes, or preservatives Pregnant or trying to get pregnant Breastfeeding How should I use this medication? This medication is injected into a vein or a muscle. It is given by your care team in a hospital or clinic setting. Talk to your care team about the use of this medication in children. Special care may be needed. Overdosage: If you think you have taken too much of this medicine contact a poison control  center or emergency room at once. NOTE: This medicine is only for you. Do not share this medicine with others. What if I miss a dose? Keep appointments for follow-up doses. It is important not to miss your dose. Call your care team if you are unable to keep an appointment. What may interact with this medication? Capecitabine Fluorouracil Phenobarbital Phenytoin Primidone Trimethoprim;sulfamethoxazole This list may not describe all possible interactions. Give your health care provider a list of all the medicines, herbs, non-prescription drugs, or dietary supplements you use. Also tell them if you smoke, drink alcohol, or use illegal drugs. Some items may interact with your medicine. What should I watch for while using this medication? Your condition will be monitored carefully while you are receiving this medication. This medication may increase the side effects of 5-fluorouracil. Tell your care team if you have diarrhea or mouth sores that do not get better or that get worse. What side effects may I notice from receiving this medication? Side effects that you should report to your care team as soon as possible: Allergic reactions--skin rash, itching, hives, swelling of the face, lips, tongue, or throat This list may not describe all possible side effects. Call your doctor for medical advice about side effects. You may report side effects to FDA at 1-800-FDA-1088. Where should I keep my medication? This medication is given in a hospital or clinic. It will not be stored at home. NOTE: This sheet is a summary. It may not cover all possible information. If you have questions about this medicine, talk to your doctor, pharmacist, or health care provider.  2023 Elsevier/Gold Standard (2021-07-26 00:00:00)  Fluorouracil Injection What is this medication? FLUOROURACIL (flure oh YOOR a sil) treats some types of cancer. It works by slowing down the growth of cancer cells. This medicine may be used  for other purposes; ask your health care provider or pharmacist if you have questions. COMMON BRAND NAME(S): Adrucil What should I tell my care team before I take this medication? They need to know if you have any of these conditions: Blood disorders Dihydropyrimidine dehydrogenase (DPD) deficiency Infection, such as chickenpox, cold sores, herpes Kidney disease Liver disease Poor nutrition Recent or ongoing radiation therapy An unusual or allergic reaction to fluorouracil, other medications, foods, dyes, or preservatives If you or your partner are pregnant or trying to get pregnant Breast-feeding How should I use this medication? This medication is injected into a vein. It is administered by your care team in a hospital or clinic setting. Talk to your care team about the use of this medication in children. Special care may be needed. Overdosage: If you think you have taken  too much of this medicine contact a poison control center or emergency room at once. NOTE: This medicine is only for you. Do not share this medicine with others. What if I miss a dose? Keep appointments for follow-up doses. It is important not to miss your dose. Call your care team if you are unable to keep an appointment. What may interact with this medication? Do not take this medication with any of the following: Live virus vaccines This medication may also interact with the following: Medications that treat or prevent blood clots, such as warfarin, enoxaparin, dalteparin This list may not describe all possible interactions. Give your health care provider a list of all the medicines, herbs, non-prescription drugs, or dietary supplements you use. Also tell them if you smoke, drink alcohol, or use illegal drugs. Some items may interact with your medicine. What should I watch for while using this medication? Your condition will be monitored carefully while you are receiving this medication. This medication may make  you feel generally unwell. This is not uncommon as chemotherapy can affect healthy cells as well as cancer cells. Report any side effects. Continue your course of treatment even though you feel ill unless your care team tells you to stop. In some cases, you may be given additional medications to help with side effects. Follow all directions for their use. This medication may increase your risk of getting an infection. Call your care team for advice if you get a fever, chills, sore throat, or other symptoms of a cold or flu. Do not treat yourself. Try to avoid being around people who are sick. This medication may increase your risk to bruise or bleed. Call your care team if you notice any unusual bleeding. Be careful brushing or flossing your teeth or using a toothpick because you may get an infection or bleed more easily. If you have any dental work done, tell your dentist you are receiving this medication. Avoid taking medications that contain aspirin, acetaminophen, ibuprofen, naproxen, or ketoprofen unless instructed by your care team. These medications may hide a fever. Do not treat diarrhea with over the counter products. Contact your care team if you have diarrhea that lasts more than 2 days or if it is severe and watery. This medication can make you more sensitive to the sun. Keep out of the sun. If you cannot avoid being in the sun, wear protective clothing and sunscreen. Do not use sun lamps, tanning beds, or tanning booths. Talk to your care team if you or your partner wish to become pregnant or think you might be pregnant. This medication can cause serious birth defects if taken during pregnancy and for 3 months after the last dose. A reliable form of contraception is recommended while taking this medication and for 3 months after the last dose. Talk to your care team about effective forms of contraception. Do not father a child while taking this medication and for 3 months after the last dose.  Use a condom while having sex during this time period. Do not breastfeed while taking this medication. This medication may cause infertility. Talk to your care team if you are concerned about your fertility. What side effects may I notice from receiving this medication? Side effects that you should report to your care team as soon as possible: Allergic reactions--skin rash, itching, hives, swelling of the face, lips, tongue, or throat Heart attack--pain or tightness in the chest, shoulders, arms, or jaw, nausea, shortness of breath, cold or clammy skin,  feeling faint or lightheaded Heart failure--shortness of breath, swelling of the ankles, feet, or hands, sudden weight gain, unusual weakness or fatigue Heart rhythm changes--fast or irregular heartbeat, dizziness, feeling faint or lightheaded, chest pain, trouble breathing High ammonia level--unusual weakness or fatigue, confusion, loss of appetite, nausea, vomiting, seizures Infection--fever, chills, cough, sore throat, wounds that don't heal, pain or trouble when passing urine, general feeling of discomfort or being unwell Low red blood cell level--unusual weakness or fatigue, dizziness, headache, trouble breathing Pain, tingling, or numbness in the hands or feet, muscle weakness, change in vision, confusion or trouble speaking, loss of balance or coordination, trouble walking, seizures Redness, swelling, and blistering of the skin over hands and feet Severe or prolonged diarrhea Unusual bruising or bleeding Side effects that usually do not require medical attention (report to your care team if they continue or are bothersome): Dry skin Headache Increased tears Nausea Pain, redness, or swelling with sores inside the mouth or throat Sensitivity to light Vomiting This list may not describe all possible side effects. Call your doctor for medical advice about side effects. You may report side effects to FDA at 1-800-FDA-1088. Where should I  keep my medication? This medication is given in a hospital or clinic. It will not be stored at home. NOTE: This sheet is a summary. It may not cover all possible information. If you have questions about this medicine, talk to your doctor, pharmacist, or health care provider.  2023 Elsevier/Gold Standard (2021-07-16 00:00:00)

## 2022-06-24 NOTE — Progress Notes (Signed)
[facility] OFFICE PROGRESS NOTE   Diagnosis: Colon cancer  INTERVAL HISTORY:   Mr. Kreiser completed another cycle of FOLFIRI/bevacizumab on 06/09/2022.  Mild bleeding when he blows his nose.  No other bleeding.  He continues anticoagulation therapy.  No vomiting, mouth sores, or diarrhea.  Back pain has improved.  Objective:  Vital signs in last 24 hours:  Blood pressure 137/89, pulse 84, temperature 98.2 F (36.8 C), temperature source Oral, resp. rate 18, height 6' (1.829 m), weight 216 lb (98 kg), SpO2 100 %.    HEENT: No thrush or ulcers Resp: Lungs clear bilaterally Cardio: Regular rate and rhythm GI: No hepatosplenomegaly Vascular: 1+ edema to left leg below the knee   Portacath/PICC-without erythema  Lab Results:  Lab Results  Component Value Date   WBC 3.7 (L) 06/24/2022   HGB 13.4 06/24/2022   HCT 39.1 06/24/2022   MCV 92.2 06/24/2022   PLT 150 06/24/2022   NEUTROABS 2.2 06/24/2022    CMP  Lab Results  Component Value Date   NA 136 06/09/2022   K 4.3 06/09/2022   CL 105 06/09/2022   CO2 23 06/09/2022   GLUCOSE 202 (H) 06/09/2022   BUN 17 06/09/2022   CREATININE 0.95 06/09/2022   CALCIUM 8.6 (L) 06/09/2022   PROT 6.3 (L) 06/09/2022   ALBUMIN 3.7 06/09/2022   AST 14 (L) 06/09/2022   ALT 14 06/09/2022   ALKPHOS 475 (H) 06/09/2022   BILITOT 0.5 06/09/2022   GFRNONAA >60 06/09/2022   GFRAA 115 02/08/2020    Lab Results  Component Value Date   CEA1 2.00 07/13/2020   CEA 6.56 (H) 05/26/2022     Medications: I have reviewed the patient's current medications.   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, status post a left colectomy 03/04/2019 Mass felt to be in the transverse colon on a colonoscopy 01/07/2019 with a biopsy confirming poorly differentiated adenocarcinoma with signet ring cell features, mismatch repair  protein expression normal CT abdomen/pelvis 01/08/2019 12.3 cm indeterminate splenic mass, present in 2015 on a lumbar MRI-felt to be benign, small subpleural and pleural-based nodules at the lung bases, status post left nephrectomy CT chest 01/20/2019-small bilateral pulmonary nodules with rounded parenchymal nodules in the right lower lobe, indeterminate splenic mass CT chest 03/18/2019-multiple subcentimeter pulmonary nodules again seen bilaterally, greatest in the lower lobes, without significant change.  No new or enlarging pulmonary nodules or masses.  Partially visualized large heterogeneous splenic mass with no significant change.  Plan for follow-up chest CT at a 48-month interval. Cycle 1 FOLFOX 03/21/2019 Cycle 2 FOLFOX 04/04/2019 Cycle 3 FOLFOX 04/18/2019 (oxaliplatin dose reduced secondary to thrombocytopenia) Cycle 4 FOLFOX 05/02/2019 (oxaliplatin held secondary to thrombocytopenia) Cycle 5 FOLFOX 05/16/2019 Cycle 6 FOLFOX 05/30/2019 (oxaliplatin held secondary to thrombocytopenia) CT chest 06/08/2019-stable scattered small solid pulmonary nodules  Cycle 7 FOLFOX 06/13/2019 Cycle 8 FOLFOX 06/27/2019 Cycle 9 FOLFOX 07/11/2019 Cycle 10 FOLFOX 07/25/2019 (oxaliplatin held due to thrombocytopenia) Cycle 11 FOLFOX 08/08/2019 Cycle 12 FOLFOX 08/22/2019 CTs 01/09/2020-no evidence of recurrent disease, stable lung nodules favored to represent subpleural lymph nodes-dedicated follow-up not recommended CTs 01/11/2021-no evidence of recurrent disease CTs 01/24/2022-no evidence of recurrent disease CT lumbar spine, abdomen/pelvis 04/11/2022-numerous mixed lytic and sclerotic lesions scattered throughout the visualized axial and appendicular skeleton including several areas in the lumbar spine.   04/21/2022 CT biopsy sclerotic lesion right anterior iliac bone-consistent with metastatic colorectal adenocarcinoma, moderate to poorly differentiated.  Foundation 1-microsatellite stable, tumor mutation burden 4,  K-ras G12S PET scan [id number] hypermetabolic mixed faintly lytic and sclerotic osseous metastases throughout the axial and proximal appendicular skeleton; associated mild pathologic compression fracture at L3; no hypermetabolic extraosseous metastatic disease. 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  Left nephrectomy at age 63 12.  Port-A-Cath placement, Dr. Johney Maine, 03/16/2019 13.  Thrombocytopenia secondary to chemotherapy-oxaliplatin dose reduced beginning with cycle 3 FOLFOX, improved 14.  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  Right groin soft tissue infection May 2023-status postsurgical debridement 18.  Left leg DVT-common femoral, femoral, popliteal, posterior tibial, and peroneal veins 19.  02/04/2022-MRI cervical spine-1.7 cm T1 lesion-indeterminate; bone scan 03/14/2022-focal intense activity at approximate T1 level felt to correspond to the lesion on MRI, additional foci of activity involving the lower thoracic and lumbar spine, right iliac bone and pubic symphysis.  CT lumbar spine, abdomen/pelvis 04/11/2022-numerous mixed lytic and sclerotic lesions scattered throughout the visualized axial  and appendicular skeleton including several areas in the lumbar spine.  04/21/2022 CT biopsy sclerotic lesion right anterior iliac bone-consistent with metastatic colorectal adenocarcinoma, moderate to poorly differentiated      Disposition: Mr. Luisi has completed 2 cycles of FOLFIRI/bevacizumab.  He has tolerated the treatment well.  He will complete cycle 3 today.  Bevacizumab will be held secondary to proteinuria.  He will return for an office visit and chemotherapy in 2 weeks.  We will check a urinalysis today and repeat a urine protein when he returns in 2 weeks.  Betsy Coder, MD  06/24/2022  9:30 AM

## 2022-06-24 NOTE — Progress Notes (Signed)
Patient seen by Dr. Benay Spice today  Vitals are within treatment parameters.No intervention needed for DBP 89 per MD  Labs reviewed by Dr. Benay Spice and are not all within treatment parameters. Urine protein >300  Per physician team, patient is ready for treatment. Please note that modifications are being made to the treatment plan including Hold Avastin today.

## 2022-06-24 NOTE — Telephone Encounter (Signed)
Will add Avastin back on 3/28 due to change in urine protein w/UA

## 2022-06-26 ENCOUNTER — Inpatient Hospital Stay: Payer: BC Managed Care – PPO

## 2022-06-26 VITALS — BP 126/91 | HR 66 | Temp 98.7°F | Resp 18

## 2022-06-26 DIAGNOSIS — Z5112 Encounter for antineoplastic immunotherapy: Secondary | ICD-10-CM | POA: Diagnosis not present

## 2022-06-26 DIAGNOSIS — Z79634 Long term (current) use of topoisomerase inhibitor: Secondary | ICD-10-CM | POA: Diagnosis not present

## 2022-06-26 DIAGNOSIS — C186 Malignant neoplasm of descending colon: Secondary | ICD-10-CM

## 2022-06-26 DIAGNOSIS — M549 Dorsalgia, unspecified: Secondary | ICD-10-CM | POA: Diagnosis not present

## 2022-06-26 DIAGNOSIS — R809 Proteinuria, unspecified: Secondary | ICD-10-CM | POA: Diagnosis not present

## 2022-06-26 DIAGNOSIS — C7951 Secondary malignant neoplasm of bone: Secondary | ICD-10-CM | POA: Diagnosis not present

## 2022-06-26 DIAGNOSIS — Z5111 Encounter for antineoplastic chemotherapy: Secondary | ICD-10-CM | POA: Diagnosis not present

## 2022-06-26 DIAGNOSIS — Z79631 Long term (current) use of antimetabolite agent: Secondary | ICD-10-CM | POA: Diagnosis not present

## 2022-06-26 MED ORDER — SODIUM CHLORIDE 0.9 % IV SOLN
5.0000 mg/kg | Freq: Once | INTRAVENOUS | Status: AC
  Administered 2022-06-26: 500 mg via INTRAVENOUS
  Filled 2022-06-26: qty 16

## 2022-06-26 MED ORDER — SODIUM CHLORIDE 0.9 % IV SOLN: Freq: Once | INTRAVENOUS | Status: AC

## 2022-06-26 MED ORDER — SODIUM CHLORIDE 0.9% FLUSH: 10.0000 mL | INTRAVENOUS | Status: DC | PRN

## 2022-06-26 MED ORDER — HEPARIN SOD (PORK) LOCK FLUSH 100 UNIT/ML IV SOLN: 500.0000 [IU] | Freq: Once | INTRAVENOUS | Status: DC | PRN

## 2022-06-26 NOTE — Patient Instructions (Signed)
[facility]   Discharge Instructions: Thank you for choosing [facility] to provide your oncology and hematology care.   If you have a lab appointment with the Cancer Center, please go directly to the Cancer Center and check in at the registration area.   Wear comfortable clothing and clothing appropriate for easy access to any Portacath or PICC line.   We strive to give you quality time with your provider. You may need to reschedule your appointment if you arrive late (15 or more minutes).  Arriving late affects you and other patients whose appointments are after yours.  Also, if you miss three or more appointments without notifying the office, you may be dismissed from the clinic at the provider's discretion.      For prescription refill requests, have your pharmacy contact our office and allow 72 hours for refills to be completed.    Today you received the following chemotherapy and/or immunotherapy agents Avastin.      To help prevent nausea and vomiting after your treatment, we encourage you to take your nausea medication as directed.  BELOW ARE SYMPTOMS THAT SHOULD BE REPORTED IMMEDIATELY: *FEVER GREATER THAN 100.4 F (38 C) OR HIGHER *CHILLS OR SWEATING *NAUSEA AND VOMITING THAT IS NOT CONTROLLED WITH YOUR NAUSEA MEDICATION *UNUSUAL SHORTNESS OF BREATH *UNUSUAL BRUISING OR BLEEDING *URINARY PROBLEMS (pain or burning when urinating, or frequent urination) *BOWEL PROBLEMS (unusual diarrhea, constipation, pain near the anus) TENDERNESS IN MOUTH AND THROAT WITH OR WITHOUT PRESENCE OF ULCERS (sore throat, sores in mouth, or a toothache) UNUSUAL RASH, SWELLING OR PAIN  UNUSUAL VAGINAL DISCHARGE OR ITCHING   Items with * indicate a potential emergency and should be followed up as soon as possible or go to the Emergency Department if any problems should occur.  Please show the CHEMOTHERAPY ALERT CARD or IMMUNOTHERAPY ALERT CARD at  check-in to the Emergency Department and triage nurse.  Should you have questions after your visit or need to cancel or reschedule your appointment, please contact [facility]  Dept: [phone]  and follow the prompts.  Office hours are 8:00 a.m. to 4:30 p.m. Monday - Friday. Please note that voicemails left after 4:00 p.m. may not be returned until the following business day.  We are closed weekends and major holidays. You have access to a nurse at all times for urgent questions. Please call the main number to the clinic Dept: [phone] and follow the prompts.   For any non-urgent questions, you may also contact your provider using MyChart. We now offer e-Visits for anyone 18 and older to request care online for non-urgent symptoms. For details visit [email or web address].   Also download the MyChart app! Go to the app store, search "MyChart", open the app, select [facility], and log in with your MyChart username and password.  Bevacizumab Injection What is this medication? BEVACIZUMAB (be va SIZ yoo mab) treats some types of cancer. It works by blocking a protein that causes cancer cells to grow and multiply. This helps to slow or stop the spread of cancer cells. It is a monoclonal antibody. This medicine may be used for other purposes; ask your health care provider or pharmacist if you have questions. COMMON BRAND NAME(S): Alymsys, Avastin, MVASI, Zirabev What should I tell my care team before I take this medication? They need to know if you have any of these conditions: Blood clots Coughing up blood Having or recent surgery Heart failure   High blood pressure History of a connection between 2 or more body parts that do not usually connect (fistula) History of a tear in your stomach or intestines Protein in your urine An unusual or allergic reaction to bevacizumab, other medications, foods, dyes, or preservatives Pregnant or trying to get  pregnant Breast-feeding How should I use this medication? This medication is injected into a vein. It is given by your care team in a hospital or clinic setting. Talk to your care team the use of this medication in children. Special care may be needed. Overdosage: If you think you have taken too much of this medicine contact a poison control center or emergency room at once. NOTE: This medicine is only for you. Do not share this medicine with others. What if I miss a dose? Keep appointments for follow-up doses. It is important not to miss your dose. Call your care team if you are unable to keep an appointment. What may interact with this medication? Interactions are not expected. This list may not describe all possible interactions. Give your health care provider a list of all the medicines, herbs, non-prescription drugs, or dietary supplements you use. Also tell them if you smoke, drink alcohol, or use illegal drugs. Some items may interact with your medicine. What should I watch for while using this medication? Your condition will be monitored carefully while you are receiving this medication. You may need blood work while taking this medication. This medication may make you feel generally unwell. This is not uncommon as chemotherapy can affect healthy cells as well as cancer cells. Report any side effects. Continue your course of treatment even though you feel ill unless your care team tells you to stop. This medication may increase your risk to bruise or bleed. Call your care team if you notice any unusual bleeding. Before having surgery, talk to your care team to make sure it is ok. This medication can increase the risk of poor healing of your surgical site or wound. You will need to stop this medication for 28 days before surgery. After surgery, wait at least 28 days before restarting this medication. Make sure the surgical site or wound is healed enough before restarting this medication. Talk  to your care team if questions. Talk to your care team if you may be pregnant. Serious birth defects can occur if you take this medication during pregnancy and for 6 months after the last dose. Contraception is recommended while taking this medication and for 6 months after the last dose. Your care team can help you find the option that works for you. Do not breastfeed while taking this medication and for 6 months after the last dose. This medication can cause infertility. Talk to your care team if you are concerned about your fertility. What side effects may I notice from receiving this medication? Side effects that you should report to your care team as soon as possible: Allergic reactions--skin rash, itching, hives, swelling of the face, lips, tongue, or throat Bleeding--bloody or black, tar-like stools, vomiting blood or brown material that looks like coffee grounds, red or dark brown urine, small red or purple spots on skin, unusual bruising or bleeding Blood clot--pain, swelling, or warmth in the leg, shortness of breath, chest pain Heart attack--pain or tightness in the chest, shoulders, arms, or jaw, nausea, shortness of breath, cold or clammy skin, feeling faint or lightheaded Heart failure--shortness of breath, swelling of the ankles, feet, or hands, sudden weight gain, unusual weakness or fatigue   Increase in blood pressure Infection--fever, chills, cough, sore throat, wounds that don't heal, pain or trouble when passing urine, general feeling of discomfort or being unwell Infusion reactions--chest pain, shortness of breath or trouble breathing, feeling faint or lightheaded Kidney injury--decrease in the amount of urine, swelling of the ankles, hands, or feet Stomach pain that is severe, does not go away, or gets worse Stroke--sudden numbness or weakness of the face, arm, or leg, trouble speaking, confusion, trouble walking, loss of balance or coordination, dizziness, severe headache,  change in vision Sudden and severe headache, confusion, change in vision, seizures, which may be signs of posterior reversible encephalopathy syndrome (PRES) Side effects that usually do not require medical attention (report to your care team if they continue or are bothersome): Back pain Change in taste Diarrhea Dry skin Increased tears Nosebleed This list may not describe all possible side effects. Call your doctor for medical advice about side effects. You may report side effects to FDA at 1-800-FDA-1088. Where should I keep my medication? This medication is given in a hospital or clinic. It will not be stored at home. NOTE: This sheet is a summary. It may not cover all possible information. If you have questions about this medicine, talk to your doctor, pharmacist, or health care provider.  2023 Elsevier/Gold Standard (2021-07-19 00:00:00)   

## 2022-07-01 ENCOUNTER — Other Ambulatory Visit: Payer: Self-pay

## 2022-07-06 ENCOUNTER — Other Ambulatory Visit: Payer: Self-pay | Admitting: Oncology

## 2022-07-08 ENCOUNTER — Encounter: Payer: Self-pay | Admitting: *Deleted

## 2022-07-08 ENCOUNTER — Inpatient Hospital Stay: Payer: BC Managed Care – PPO | Admitting: Oncology

## 2022-07-08 ENCOUNTER — Inpatient Hospital Stay: Payer: BC Managed Care – PPO

## 2022-07-08 ENCOUNTER — Inpatient Hospital Stay: Payer: BC Managed Care – PPO | Attending: Oncology

## 2022-07-08 VITALS — BP 132/87 | HR 86 | Temp 98.1°F | Resp 18 | Ht 72.0 in | Wt 217.8 lb

## 2022-07-08 VITALS — BP 124/77 | HR 74 | Resp 18

## 2022-07-08 DIAGNOSIS — C186 Malignant neoplasm of descending colon: Secondary | ICD-10-CM

## 2022-07-08 DIAGNOSIS — Z5111 Encounter for antineoplastic chemotherapy: Secondary | ICD-10-CM | POA: Insufficient documentation

## 2022-07-08 DIAGNOSIS — C7951 Secondary malignant neoplasm of bone: Secondary | ICD-10-CM | POA: Insufficient documentation

## 2022-07-08 DIAGNOSIS — Z5189 Encounter for other specified aftercare: Secondary | ICD-10-CM | POA: Diagnosis not present

## 2022-07-08 DIAGNOSIS — R809 Proteinuria, unspecified: Secondary | ICD-10-CM | POA: Diagnosis not present

## 2022-07-08 DIAGNOSIS — D6959 Other secondary thrombocytopenia: Secondary | ICD-10-CM | POA: Diagnosis not present

## 2022-07-08 DIAGNOSIS — R110 Nausea: Secondary | ICD-10-CM | POA: Diagnosis not present

## 2022-07-08 DIAGNOSIS — Z95828 Presence of other vascular implants and grafts: Secondary | ICD-10-CM

## 2022-07-08 LAB — CMP (CANCER CENTER ONLY)
ALT: 18 U/L (ref 0–44)
AST: 17 U/L (ref 15–41)
Albumin: 3.8 g/dL (ref 3.5–5.0)
Alkaline Phosphatase: 362 U/L — ABNORMAL HIGH (ref 38–126)
Anion gap: 7 (ref 5–15)
BUN: 19 mg/dL (ref 6–20)
CO2: 25 mmol/L (ref 22–32)
Calcium: 9 mg/dL (ref 8.9–10.3)
Chloride: 103 mmol/L (ref 98–111)
Creatinine: 0.97 mg/dL (ref 0.61–1.24)
GFR, Estimated: >60 mL/min (ref >60)
Glucose, Bld: 165 mg/dL — ABNORMAL HIGH (ref 70–99)
Potassium: 4.8 mmol/L (ref 3.5–5.1)
Sodium: 135 mmol/L (ref 135–145)
Total Bilirubin: 0.7 mg/dL (ref 0.3–1.2)
Total Protein: 6.3 g/dL — ABNORMAL LOW (ref 6.5–8.1)

## 2022-07-08 LAB — URINALYSIS, COMPLETE (UACMP) WITH MICROSCOPIC
Bacteria, UA: NONE SEEN
Bilirubin Urine: NEGATIVE
Glucose, UA: NEGATIVE mg/dL
Hgb urine dipstick: NEGATIVE
Ketones, ur: NEGATIVE mg/dL
Leukocytes,Ua: NEGATIVE
Nitrite: NEGATIVE
Protein, ur: >300 mg/dL — AB
Specific Gravity, Urine: 1.031 — ABNORMAL HIGH (ref 1.005–1.030)
pH: 5.5 (ref 5.0–8.0)

## 2022-07-08 LAB — CBC WITH DIFFERENTIAL (CANCER CENTER ONLY)
Abs Immature Granulocytes: 0.01 10*3/uL (ref 0.00–0.07)
Basophils Absolute: 0.1 10*3/uL (ref 0.0–0.1)
Basophils Relative: 1 %
Eosinophils Absolute: 0.3 10*3/uL (ref 0.0–0.5)
Eosinophils Relative: 6 %
HCT: 38.4 % — ABNORMAL LOW (ref 39.0–52.0)
Hemoglobin: 13.1 g/dL (ref 13.0–17.0)
Immature Granulocytes: 0 %
Lymphocytes Relative: 23 %
Lymphs Abs: 1 10*3/uL (ref 0.7–4.0)
MCH: 31.8 pg (ref 26.0–34.0)
MCHC: 34.1 g/dL (ref 30.0–36.0)
MCV: 93.2 fL (ref 80.0–100.0)
Monocytes Absolute: 0.4 10*3/uL (ref 0.1–1.0)
Monocytes Relative: 9 %
Neutro Abs: 2.5 10*3/uL (ref 1.7–7.7)
Neutrophils Relative %: 61 %
Platelet Count: 127 10*3/uL — ABNORMAL LOW (ref 150–400)
RBC: 4.12 MIL/uL — ABNORMAL LOW (ref 4.22–5.81)
RDW: 15.1 % (ref 11.5–15.5)
WBC Count: 4.2 10*3/uL (ref 4.0–10.5)
nRBC: 0 % (ref 0.0–0.2)

## 2022-07-08 LAB — TOTAL PROTEIN, URINE DIPSTICK: Protein, ur: 300 mg/dL — AB

## 2022-07-08 MED ORDER — SODIUM CHLORIDE 0.9 % IV SOLN
400.0000 mg/m2 | Freq: Once | INTRAVENOUS | Status: AC
  Administered 2022-07-08: 892 mg via INTRAVENOUS
  Filled 2022-07-08: qty 44.6

## 2022-07-08 MED ORDER — SODIUM CHLORIDE 0.9 % IV SOLN
180.0000 mg/m2 | Freq: Once | INTRAVENOUS | Status: AC
  Administered 2022-07-08: 400 mg via INTRAVENOUS
  Filled 2022-07-08: qty 20

## 2022-07-08 MED ORDER — SODIUM CHLORIDE 0.9 % IV SOLN: Freq: Once | INTRAVENOUS | Status: AC

## 2022-07-08 MED ORDER — FLUOROURACIL CHEMO INJECTION 2.5 GM/50ML
400.0000 mg/m2 | Freq: Once | INTRAVENOUS | Status: AC
  Administered 2022-07-08: 900 mg via INTRAVENOUS
  Filled 2022-07-08: qty 18

## 2022-07-08 MED ORDER — SODIUM CHLORIDE 0.9 % IV SOLN
10.0000 mg | Freq: Once | INTRAVENOUS | Status: AC
  Administered 2022-07-08: 10 mg via INTRAVENOUS
  Filled 2022-07-08: qty 10

## 2022-07-08 MED ORDER — SODIUM CHLORIDE 0.9 % IV SOLN
2400.0000 mg/m2 | INTRAVENOUS | Status: DC
  Administered 2022-07-08: 5000 mg via INTRAVENOUS
  Filled 2022-07-08: qty 100

## 2022-07-08 MED ORDER — PALONOSETRON HCL INJECTION 0.25 MG/5ML
0.2500 mg | Freq: Once | INTRAVENOUS | Status: AC
  Administered 2022-07-08: 0.25 mg via INTRAVENOUS
  Filled 2022-07-08: qty 5

## 2022-07-08 MED ORDER — ZOLEDRONIC ACID 4 MG/100ML IV SOLN
4.0000 mg | Freq: Once | INTRAVENOUS | Status: AC
  Administered 2022-07-08: 4 mg via INTRAVENOUS
  Filled 2022-07-08: qty 100

## 2022-07-08 MED ORDER — ATROPINE SULFATE 1 MG/ML IV SOLN
0.5000 mg | Freq: Once | INTRAVENOUS | Status: AC | PRN
  Administered 2022-07-08: 0.5 mg via INTRAVENOUS
  Filled 2022-07-08: qty 1

## 2022-07-08 NOTE — Progress Notes (Signed)
Protein > 300 on urinalysis. Hold Avastin today per [name].

## 2022-07-08 NOTE — Progress Notes (Signed)
Patient seen by [name] today  Vitals are within treatment parameters.  Labs reviewed by [name] and are not all within treatment parameters. Urine protein 300--have ordered a complete U/A to confirm.   Per physician team, patient is ready for treatment. Please note that modifications are being made to the treatment plan including Hold off on Avastin till UA results return.

## 2022-07-08 NOTE — Patient Instructions (Signed)
[name]   Discharge Instructions: Thank you for choosing [facility] to provide your oncology and hematology care.   If you have a lab appointment with the [facility], please go directly to the [facility] and check in at the registration area.   Wear comfortable clothing and clothing appropriate for easy access to any Portacath or PICC line.   We strive to give you quality time with your provider. You may need to reschedule your appointment if you arrive late (15 or more minutes).  Arriving late affects you and other patients whose appointments are after yours.  Also, if you miss three or more appointments without notifying the office, you may be dismissed from the clinic at the provider's discretion.      For prescription refill requests, have your pharmacy contact our office and allow 72 hours for refills to be completed.    Today you received the following chemotherapy and/or immunotherapy agents Irinotecan (CAMPTOSAR), Leucovorin & Flourouracil (ADRUCIL).      To help prevent nausea and vomiting after your treatment, we encourage you to take your nausea medication as directed.  BELOW ARE SYMPTOMS THAT SHOULD BE REPORTED IMMEDIATELY: *FEVER GREATER THAN 100.4 F (38 C) OR HIGHER *CHILLS OR SWEATING *NAUSEA AND VOMITING THAT IS NOT CONTROLLED WITH YOUR NAUSEA MEDICATION *UNUSUAL SHORTNESS OF BREATH *UNUSUAL BRUISING OR BLEEDING *URINARY PROBLEMS (pain or burning when urinating, or frequent urination) *BOWEL PROBLEMS (unusual diarrhea, constipation, pain near the anus) TENDERNESS IN MOUTH AND THROAT WITH OR WITHOUT PRESENCE OF ULCERS (sore throat, sores in mouth, or a toothache) UNUSUAL RASH, SWELLING OR PAIN  UNUSUAL VAGINAL DISCHARGE OR ITCHING   Items with * indicate a potential emergency and should be followed up as soon as possible or go to the Emergency Department if any problems should occur.  Please show the  CHEMOTHERAPY ALERT CARD or IMMUNOTHERAPY ALERT CARD at check-in to the Emergency Department and triage nurse.  Should you have questions after your visit or need to cancel or reschedule your appointment, please contact [facility]  Dept: [phone]  and follow the prompts.  Office hours are 8:00 a.m. to 4:30 p.m. Monday - Friday. Please note that voicemails left after 4:00 p.m. may not be returned until the following business day.  We are closed weekends and major holidays. You have access to a nurse at all times for urgent questions. Please call the main number to the clinic Dept: [phone] and follow the prompts.   For any non-urgent questions, you may also contact your provider using MyChart. We now offer e-Visits for anyone 4 and older to request care online for non-urgent symptoms. For details visit [email or web address].   Also download the MyChart app! Go to the app store, search "MyChart", open the app, select [facility], and log in with your MyChart username and password.  Irinotecan Injection What is this medication? IRINOTECAN (ir in oh TEE kan) treats some types of cancer. It works by slowing down the growth of cancer cells. This medicine may be used for other purposes; ask your health care provider or pharmacist if you have questions. COMMON BRAND NAME(S): Camptosar What should I tell my care team before I take this medication? They need to know if you have any of these conditions: Dehydration Diarrhea Infection, especially a viral infection, such as chickenpox, cold sores, herpes Liver disease Low blood cell levels (white cells, red cells, and platelets) Low levels of electrolytes,  such as calcium, magnesium, or potassium in your blood Recent or ongoing radiation An unusual or allergic reaction to irinotecan, other medications, foods, dyes, or preservatives If you or your partner are pregnant or trying to get  pregnant Breast-feeding How should I use this medication? This medication is injected into a vein. It is given by your care team in a hospital or clinic setting. Talk to your care team about the use of this medication in children. Special care may be needed. Overdosage: If you think you have taken too much of this medicine contact a poison control center or emergency room at once. NOTE: This medicine is only for you. Do not share this medicine with others. What if I miss a dose? Keep appointments for follow-up doses. It is important not to miss your dose. Call your care team if you are unable to keep an appointment. What may interact with this medication? Do not take this medication with any of the following: Cobicistat Itraconazole This medication may also interact with the following: Certain antibiotics, such as clarithromycin, rifampin, rifabutin Certain antivirals for HIV or AIDS Certain medications for fungal infections, such as ketoconazole, posaconazole, voriconazole Certain medications for seizures, such as carbamazepine, phenobarbital, phenytoin Gemfibrozil Nefazodone St. John's wort This list may not describe all possible interactions. Give your health care provider a list of all the medicines, herbs, non-prescription drugs, or dietary supplements you use. Also tell them if you smoke, drink alcohol, or use illegal drugs. Some items may interact with your medicine. What should I watch for while using this medication? Your condition will be monitored carefully while you are receiving this medication. You may need blood work while taking this medication. This medication may make you feel generally unwell. This is not uncommon as chemotherapy can affect healthy cells as well as cancer cells. Report any side effects. Continue your course of treatment even though you feel ill unless your care team tells you to stop. This medication can cause serious side effects. To reduce the risk, your  care team may give you other medications to take before receiving this one. Be sure to follow the directions from your care team. This medication may affect your coordination, reaction time, or judgement. Do not drive or operate machinery until you know how this medication affects you. Sit up or stand slowly to reduce the risk of dizzy or fainting spells. Drinking alcohol with this medication can increase the risk of these side effects. This medication may increase your risk of getting an infection. Call your care team for advice if you get a fever, chills, sore throat, or other symptoms of a cold or flu. Do not treat yourself. Try to avoid being around people who are sick. Avoid taking medications that contain aspirin, acetaminophen, ibuprofen, naproxen, or ketoprofen unless instructed by your care team. These medications may hide a fever. This medication may increase your risk to bruise or bleed. Call your care team if you notice any unusual bleeding. Be careful brushing or flossing your teeth or using a toothpick because you may get an infection or bleed more easily. If you have any dental work done, tell your dentist you are receiving this medication. Talk to your care team if you or your partner are pregnant or think either of you might be pregnant. This medication can cause serious birth defects if taken during pregnancy and for 6 months after the last dose. You will need a negative pregnancy test before starting this medication. Contraception is recommended while  taking this medication and for 6 months after the last dose. Your care team can help you find the option that works for you. Do not father a child while taking this medication and for 3 months after the last dose. Use a condom for contraception during this time period. Do not breastfeed while taking this medication and for 7 days after the last dose. This medication may cause infertility. Talk to your care team if you are concerned about  your fertility. What side effects may I notice from receiving this medication? Side effects that you should report to your care team as soon as possible: Allergic reactions--skin rash, itching, hives, swelling of the face, lips, tongue, or throat Dry cough, shortness of breath or trouble breathing Increased saliva or tears, increased sweating, stomach cramping, diarrhea, small pupils, unusual weakness or fatigue, slow heartbeat Infection--fever, chills, cough, sore throat, wounds that don't heal, pain or trouble when passing urine, general feeling of discomfort or being unwell Kidney injury--decrease in the amount of urine, swelling of the ankles, hands, or feet Low red blood cell level--unusual weakness or fatigue, dizziness, headache, trouble breathing Severe or prolonged diarrhea Unusual bruising or bleeding Side effects that usually do not require medical attention (report to your care team if they continue or are bothersome): Constipation Diarrhea Hair loss Loss of appetite Nausea Stomach pain This list may not describe all possible side effects. Call your doctor for medical advice about side effects. You may report side effects to FDA at 1-800-FDA-1088. Where should I keep my medication? This medication is given in a hospital or clinic. It will not be stored at home. NOTE: This sheet is a summary. It may not cover all possible information. If you have questions about this medicine, talk to your doctor, pharmacist, or health care provider.  2023 Elsevier/Gold Standard (2021-07-25 00:00:00)  Leucovorin Injection What is this medication? LEUCOVORIN (loo koe VOR in) prevents side effects from certain medications, such as methotrexate. It works by increasing folate levels. This helps protect healthy cells in your body. It may also be used to treat anemia caused by low levels of folate. It can also be used with fluorouracil, a type of chemotherapy, to treat colorectal cancer. It works by  increasing the effects of fluorouracil in the body. This medicine may be used for other purposes; ask your health care provider or pharmacist if you have questions. What should I tell my care team before I take this medication? They need to know if you have any of these conditions: Anemia from low levels of vitamin B12 in the blood An unusual or allergic reaction to leucovorin, folic acid, other medications, foods, dyes, or preservatives Pregnant or trying to get pregnant Breastfeeding How should I use this medication? This medication is injected into a vein or a muscle. It is given by your care team in a hospital or clinic setting. Talk to your care team about the use of this medication in children. Special care may be needed. Overdosage: If you think you have taken too much of this medicine contact a poison control center or emergency room at once. NOTE: This medicine is only for you. Do not share this medicine with others. What if I miss a dose? Keep appointments for follow-up doses. It is important not to miss your dose. Call your care team if you are unable to keep an appointment. What may interact with this medication? Capecitabine Fluorouracil Phenobarbital Phenytoin Primidone Trimethoprim;sulfamethoxazole This list may not describe all possible interactions. Give  your health care provider a list of all the medicines, herbs, non-prescription drugs, or dietary supplements you use. Also tell them if you smoke, drink alcohol, or use illegal drugs. Some items may interact with your medicine. What should I watch for while using this medication? Your condition will be monitored carefully while you are receiving this medication. This medication may increase the side effects of 5-fluorouracil. Tell your care team if you have diarrhea or mouth sores that do not get better or that get worse. What side effects may I notice from receiving this medication? Side effects that you should report to  your care team as soon as possible: Allergic reactions--skin rash, itching, hives, swelling of the face, lips, tongue, or throat This list may not describe all possible side effects. Call your doctor for medical advice about side effects. You may report side effects to FDA at 1-800-FDA-1088. Where should I keep my medication? This medication is given in a hospital or clinic. It will not be stored at home. NOTE: This sheet is a summary. It may not cover all possible information. If you have questions about this medicine, talk to your doctor, pharmacist, or health care provider.  2023 Elsevier/Gold Standard (2021-07-26 00:00:00)  Fluorouracil Injection What is this medication? FLUOROURACIL (flure oh YOOR a sil) treats some types of cancer. It works by slowing down the growth of cancer cells. This medicine may be used for other purposes; ask your health care provider or pharmacist if you have questions. COMMON BRAND NAME(S): Adrucil What should I tell my care team before I take this medication? They need to know if you have any of these conditions: Blood disorders Dihydropyrimidine dehydrogenase (DPD) deficiency Infection, such as chickenpox, cold sores, herpes Kidney disease Liver disease Poor nutrition Recent or ongoing radiation therapy An unusual or allergic reaction to fluorouracil, other medications, foods, dyes, or preservatives If you or your partner are pregnant or trying to get pregnant Breast-feeding How should I use this medication? This medication is injected into a vein. It is administered by your care team in a hospital or clinic setting. Talk to your care team about the use of this medication in children. Special care may be needed. Overdosage: If you think you have taken too much of this medicine contact a poison control center or emergency room at once. NOTE: This medicine is only for you. Do not share this medicine with others. What if I miss a dose? Keep appointments  for follow-up doses. It is important not to miss your dose. Call your care team if you are unable to keep an appointment. What may interact with this medication? Do not take this medication with any of the following: Live virus vaccines This medication may also interact with the following: Medications that treat or prevent blood clots, such as warfarin, enoxaparin, dalteparin This list may not describe all possible interactions. Give your health care provider a list of all the medicines, herbs, non-prescription drugs, or dietary supplements you use. Also tell them if you smoke, drink alcohol, or use illegal drugs. Some items may interact with your medicine. What should I watch for while using this medication? Your condition will be monitored carefully while you are receiving this medication. This medication may make you feel generally unwell. This is not uncommon as chemotherapy can affect healthy cells as well as cancer cells. Report any side effects. Continue your course of treatment even though you feel ill unless your care team tells you to stop. In some cases, you  may be given additional medications to help with side effects. Follow all directions for their use. This medication may increase your risk of getting an infection. Call your care team for advice if you get a fever, chills, sore throat, or other symptoms of a cold or flu. Do not treat yourself. Try to avoid being around people who are sick. This medication may increase your risk to bruise or bleed. Call your care team if you notice any unusual bleeding. Be careful brushing or flossing your teeth or using a toothpick because you may get an infection or bleed more easily. If you have any dental work done, tell your dentist you are receiving this medication. Avoid taking medications that contain aspirin, acetaminophen, ibuprofen, naproxen, or ketoprofen unless instructed by your care team. These medications may hide a fever. Do not treat  diarrhea with over the counter products. Contact your care team if you have diarrhea that lasts more than 2 days or if it is severe and watery. This medication can make you more sensitive to the sun. Keep out of the sun. If you cannot avoid being in the sun, wear protective clothing and sunscreen. Do not use sun lamps, tanning beds, or tanning booths. Talk to your care team if you or your partner wish to become pregnant or think you might be pregnant. This medication can cause serious birth defects if taken during pregnancy and for 3 months after the last dose. A reliable form of contraception is recommended while taking this medication and for 3 months after the last dose. Talk to your care team about effective forms of contraception. Do not father a child while taking this medication and for 3 months after the last dose. Use a condom while having sex during this time period. Do not breastfeed while taking this medication. This medication may cause infertility. Talk to your care team if you are concerned about your fertility. What side effects may I notice from receiving this medication? Side effects that you should report to your care team as soon as possible: Allergic reactions--skin rash, itching, hives, swelling of the face, lips, tongue, or throat Heart attack--pain or tightness in the chest, shoulders, arms, or jaw, nausea, shortness of breath, cold or clammy skin, feeling faint or lightheaded Heart failure--shortness of breath, swelling of the ankles, feet, or hands, sudden weight gain, unusual weakness or fatigue Heart rhythm changes--fast or irregular heartbeat, dizziness, feeling faint or lightheaded, chest pain, trouble breathing High ammonia level--unusual weakness or fatigue, confusion, loss of appetite, nausea, vomiting, seizures Infection--fever, chills, cough, sore throat, wounds that don't heal, pain or trouble when passing urine, general feeling of discomfort or being unwell Low red  blood cell level--unusual weakness or fatigue, dizziness, headache, trouble breathing Pain, tingling, or numbness in the hands or feet, muscle weakness, change in vision, confusion or trouble speaking, loss of balance or coordination, trouble walking, seizures Redness, swelling, and blistering of the skin over hands and feet Severe or prolonged diarrhea Unusual bruising or bleeding Side effects that usually do not require medical attention (report to your care team if they continue or are bothersome): Dry skin Headache Increased tears Nausea Pain, redness, or swelling with sores inside the mouth or throat Sensitivity to light Vomiting This list may not describe all possible side effects. Call your doctor for medical advice about side effects. You may report side effects to FDA at 1-800-FDA-1088. Where should I keep my medication? This medication is given in a hospital or clinic. It will not  be stored at home. NOTE: This sheet is a summary. It may not cover all possible information. If you have questions about this medicine, talk to your doctor, pharmacist, or health care provider.  2023 Elsevier/Gold Standard (2021-07-16 00:00:00)  Zoledronic Acid Injection (Cancer) What is this medication? ZOLEDRONIC ACID (ZOE le dron ik AS id) treats high calcium levels in the blood caused by cancer. It may also be used with chemotherapy to treat weakened bones caused by cancer. It works by slowing down the release of calcium from bones. This lowers calcium levels in your blood. It also makes your bones stronger and less likely to break (fracture). It belongs to a group of medications called bisphosphonates. This medicine may be used for other purposes; ask your health care provider or pharmacist if you have questions. COMMON BRAND NAME(S): Zometa, Zometa Powder What should I tell my care team before I take this medication? They need to know if you have any of these conditions: Dehydration Dental  disease Kidney disease Liver disease Low levels of calcium in the blood Lung or breathing disease, such as asthma Receiving steroids, such as dexamethasone or prednisone An unusual or allergic reaction to zoledronic acid, other medications, foods, dyes, or preservatives Pregnant or trying to get pregnant Breast-feeding How should I use this medication? This medication is injected into a vein. It is given by your care team in a hospital or clinic setting. Talk to your care team about the use of this medication in children. Special care may be needed. Overdosage: If you think you have taken too much of this medicine contact a poison control center or emergency room at once. NOTE: This medicine is only for you. Do not share this medicine with others. What if I miss a dose? Keep appointments for follow-up doses. It is important not to miss your dose. Call your care team if you are unable to keep an appointment. What may interact with this medication? Certain antibiotics given by injection Diuretics, such as bumetanide, furosemide NSAIDs, medications for pain and inflammation, such as ibuprofen or naproxen Teriparatide Thalidomide This list may not describe all possible interactions. Give your health care provider a list of all the medicines, herbs, non-prescription drugs, or dietary supplements you use. Also tell them if you smoke, drink alcohol, or use illegal drugs. Some items may interact with your medicine. What should I watch for while using this medication? Visit your care team for regular checks on your progress. It may be some time before you see the benefit from this medication. Some people who take this medication have severe bone, joint, or muscle pain. This medication may also increase your risk for jaw problems or a broken thigh bone. Tell your care team right away if you have severe pain in your jaw, bones, joints, or muscles. Tell you care team if you have any pain that does not  go away or that gets worse. Tell your dentist and dental surgeon that you are taking this medication. You should not have major dental surgery while on this medication. See your dentist to have a dental exam and fix any dental problems before starting this medication. Take good care of your teeth while on this medication. Make sure you see your dentist for regular follow-up appointments. You should make sure you get enough calcium and vitamin D while you are taking this medication. Discuss the foods you eat and the vitamins you take with your care team. Check with your care team if you have severe  diarrhea, nausea, and vomiting, or if you sweat a lot. The loss of too much body fluid may make it dangerous for you to take this medication. You may need bloodwork while taking this medication. Talk to your care team if you wish to become pregnant or think you might be pregnant. This medication can cause serious birth defects. What side effects may I notice from receiving this medication? Side effects that you should report to your care team as soon as possible: Allergic reactions--skin rash, itching, hives, swelling of the face, lips, tongue, or throat Kidney injury--decrease in the amount of urine, swelling of the ankles, hands, or feet Low calcium level--muscle pain or cramps, confusion, tingling, or numbness in the hands or feet Osteonecrosis of the jaw--pain, swelling, or redness in the mouth, numbness of the jaw, poor healing after dental work, unusual discharge from the mouth, visible bones in the mouth Severe bone, joint, or muscle pain Side effects that usually do not require medical attention (report to your care team if they continue or are bothersome): Constipation Fatigue Fever Loss of appetite Nausea Stomach pain This list may not describe all possible side effects. Call your doctor for medical advice about side effects. You may report side effects to FDA at 1-800-FDA-1088. Where should  I keep my medication? This medication is given in a hospital or clinic. It will not be stored at home. NOTE: This sheet is a summary. It may not cover all possible information. If you have questions about this medicine, talk to your doctor, pharmacist, or health care provider.  2023 Elsevier/Gold Standard (2007-05-08 00:00:00)   The chemotherapy medication bag should finish at 46 hours, 96 hours, or 7 days. For example, if your pump is scheduled for 46 hours and it was put on at 4:00 p.m., it should finish at 2:00 p.m. the day it is scheduled to come off regardless of your appointment time.     Estimated time to finish at 12:30 p.m. on Thursday 07/10/2022.   If the display on your pump reads "Low Volume" and it is beeping, take the batteries out of the pump and come to the cancer center for it to be taken off.   If the pump alarms go off prior to the pump reading "Low Volume" then call [phone] and someone can assist you.  If the plunger comes out and the chemotherapy medication is leaking out, please use your home chemo spill kit to clean up the spill. Do NOT use paper towels or other household products.  If you have problems or questions regarding your pump, please call either [phone] (24 hours a day) or the cancer center Monday-Friday 8:00 a.m.- 4:30 p.m. at the clinic number and we will assist you. If you are unable to get assistance, then go to the nearest Emergency Department and ask the staff to contact the IV team for assistance.

## 2022-07-08 NOTE — Progress Notes (Signed)
[facility] OFFICE PROGRESS NOTE   Diagnosis: Colon cancer  INTERVAL HISTORY:   [name] completed another cycle of FOLFIRI/bevacizumab on 06/24/2022.  He has noted increased bleeding when he blows his nose.  He had increased malaise and nausea following this cycle of chemotherapy.  No emesis or diarrhea.  He is working.  Low back pain has improved.  Objective:  Vital signs in last 24 hours:  Blood pressure 132/87, pulse 86, temperature 98.1 F (36.7 C), resp. rate 18, height 6' (1.829 m), weight 217 lb 12.8 oz (98.8 kg), SpO2 100 %.    HEENT: No thrush or ulcers Resp: Lungs clear bilaterally Cardio: Regular rate and rhythm GI: Nontender, no hepatosplenomegaly Vascular: The left lower leg is larger than the right side, no edema    Portacath/PICC-without erythema  Lab Results:  Lab Results  Component Value Date   WBC 4.2 07/08/2022   HGB 13.1 07/08/2022   HCT 38.4 (L) 07/08/2022   MCV 93.2 07/08/2022   PLT 127 (L) 07/08/2022   NEUTROABS 2.5 07/08/2022    CMP  Lab Results  Component Value Date   NA 135 07/08/2022   K 4.8 07/08/2022   CL 103 07/08/2022   CO2 25 07/08/2022   GLUCOSE 165 (H) 07/08/2022   BUN 19 07/08/2022   CREATININE 0.97 07/08/2022   CALCIUM 9.0 07/08/2022   PROT 6.3 (L) 07/08/2022   ALBUMIN 3.8 07/08/2022   AST 17 07/08/2022   ALT 18 07/08/2022   ALKPHOS 362 (H) 07/08/2022   BILITOT 0.7 07/08/2022   GFRNONAA >60 07/08/2022   GFRAA 115 02/08/2020    Lab Results  Component Value Date   CEA1 2.00 07/13/2020   CEA 3.50 06/24/2022     Medications: I have reviewed the patient's current medications.   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, status post a left colectomy 03/04/2019 Mass felt to be in the transverse colon on a colonoscopy 01/07/2019 with a biopsy confirming poorly differentiated adenocarcinoma  with signet ring cell features, mismatch repair protein expression normal CT abdomen/pelvis 01/08/2019 12.3 cm indeterminate splenic mass, present in 2015 on a lumbar MRI-felt to be benign, small subpleural and pleural-based nodules at the lung bases, status post left nephrectomy CT chest 01/20/2019-small bilateral pulmonary nodules with rounded parenchymal nodules in the right lower lobe, indeterminate splenic mass CT chest 03/18/2019-multiple subcentimeter pulmonary nodules again seen bilaterally, greatest in the lower lobes, without significant change.  No new or enlarging pulmonary nodules or masses.  Partially visualized large heterogeneous splenic mass with no significant change.  Plan for follow-up chest CT at a 62-month interval. Cycle 1 FOLFOX 03/21/2019 Cycle 2 FOLFOX 04/04/2019 Cycle 3 FOLFOX 04/18/2019 (oxaliplatin dose reduced secondary to thrombocytopenia) Cycle 4 FOLFOX 05/02/2019 (oxaliplatin held secondary to thrombocytopenia) Cycle 5 FOLFOX 05/16/2019 Cycle 6 FOLFOX 05/30/2019 (oxaliplatin held secondary to thrombocytopenia) CT chest 06/08/2019-stable scattered small solid pulmonary nodules  Cycle 7 FOLFOX 06/13/2019 Cycle 8 FOLFOX 06/27/2019 Cycle 9 FOLFOX 07/11/2019 Cycle 10 FOLFOX 07/25/2019 (oxaliplatin held due to thrombocytopenia) Cycle 11 FOLFOX 08/08/2019 Cycle 12 FOLFOX 08/22/2019 CTs 01/09/2020-no evidence of recurrent disease, stable lung nodules favored to represent subpleural lymph nodes-dedicated follow-up not recommended CTs 01/11/2021-no evidence of recurrent disease CTs 01/24/2022-no evidence of recurrent disease CT lumbar spine, abdomen/pelvis 04/11/2022-numerous mixed lytic and sclerotic lesions scattered throughout the visualized axial and appendicular skeleton including several areas in the lumbar spine.   04/21/2022 CT biopsy sclerotic lesion right  anterior iliac bone-consistent with metastatic colorectal adenocarcinoma, moderate to poorly differentiated.  Foundation  1-microsatellite stable, tumor mutation burden 4, K-ras G12S PET scan 05/09/2022-widespread hypermetabolic mixed faintly lytic and sclerotic osseous metastases throughout the axial and proximal appendicular skeleton; associated mild pathologic compression fracture at L3; no hypermetabolic extraosseous metastatic disease. 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  Left nephrectomy at age 89 12.  Port-A-Cath placement, Dr. Michaell CowingGross, 03/16/2019 13.  Thrombocytopenia secondary to chemotherapy-oxaliplatin dose reduced beginning with cycle 3 FOLFOX, improved 14.  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  Right groin soft tissue infection May 2023-status postsurgical debridement 18.  Left leg DVT-common femoral, femoral, popliteal, posterior tibial, and peroneal veins 19.  02/04/2022-MRI cervical spine-1.7 cm T1 lesion-indeterminate; bone scan 03/14/2022-focal intense activity at approximate T1 level felt to correspond to the lesion on MRI, additional foci of activity involving the lower thoracic and lumbar spine, right iliac bone and pubic symphysis.  CT lumbar spine, abdomen/pelvis 04/11/2022-numerous mixed lytic and sclerotic lesions scattered  throughout the visualized axial and appendicular skeleton including several areas in the lumbar spine.  04/21/2022 CT biopsy sclerotic lesion right anterior iliac bone-consistent with metastatic colorectal adenocarcinoma, moderate to poorly differentiated      Disposition: [name] appears stable.  He continues to tolerate the FOLFIRI/bevacizumab well.  He will call for increased nausea.  He will complete cycle 4 today.  He will return for an office visit and chemotherapy in 2 weeks.  [name] will be scheduled for restaging CTs after cycle 5.  Thornton PapasGary Caster [place], MD  07/08/2022  10:12 AM

## 2022-07-10 ENCOUNTER — Inpatient Hospital Stay: Payer: BC Managed Care – PPO

## 2022-07-10 VITALS — BP 132/82 | HR 81 | Temp 98.1°F | Resp 18

## 2022-07-10 DIAGNOSIS — C186 Malignant neoplasm of descending colon: Secondary | ICD-10-CM | POA: Diagnosis not present

## 2022-07-10 DIAGNOSIS — R110 Nausea: Secondary | ICD-10-CM | POA: Diagnosis not present

## 2022-07-10 DIAGNOSIS — R809 Proteinuria, unspecified: Secondary | ICD-10-CM | POA: Diagnosis not present

## 2022-07-10 DIAGNOSIS — Z5111 Encounter for antineoplastic chemotherapy: Secondary | ICD-10-CM | POA: Diagnosis not present

## 2022-07-10 DIAGNOSIS — D6959 Other secondary thrombocytopenia: Secondary | ICD-10-CM | POA: Diagnosis not present

## 2022-07-10 DIAGNOSIS — C7951 Secondary malignant neoplasm of bone: Secondary | ICD-10-CM | POA: Diagnosis not present

## 2022-07-10 DIAGNOSIS — Z5189 Encounter for other specified aftercare: Secondary | ICD-10-CM | POA: Diagnosis not present

## 2022-07-10 MED ORDER — HEPARIN SOD (PORK) LOCK FLUSH 100 UNIT/ML IV SOLN
500.0000 [IU] | Freq: Once | INTRAVENOUS | Status: AC | PRN
  Administered 2022-07-10: 500 [IU]

## 2022-07-10 MED ORDER — SODIUM CHLORIDE 0.9% FLUSH
10.0000 mL | INTRAVENOUS | Status: DC | PRN
  Administered 2022-07-10: 10 mL

## 2022-07-10 NOTE — Patient Instructions (Signed)

## 2022-07-11 ENCOUNTER — Other Ambulatory Visit: Payer: Self-pay

## 2022-07-17 DIAGNOSIS — C61 Malignant neoplasm of prostate: Secondary | ICD-10-CM | POA: Diagnosis not present

## 2022-07-20 ENCOUNTER — Other Ambulatory Visit: Payer: Self-pay | Admitting: Oncology

## 2022-07-21 DIAGNOSIS — C7951 Secondary malignant neoplasm of bone: Secondary | ICD-10-CM | POA: Diagnosis not present

## 2022-07-21 DIAGNOSIS — D49512 Neoplasm of unspecified behavior of left kidney: Secondary | ICD-10-CM | POA: Diagnosis not present

## 2022-07-21 DIAGNOSIS — C61 Malignant neoplasm of prostate: Secondary | ICD-10-CM | POA: Diagnosis not present

## 2022-07-21 DIAGNOSIS — N500 Atrophy of testis: Secondary | ICD-10-CM | POA: Diagnosis not present

## 2022-07-22 ENCOUNTER — Inpatient Hospital Stay: Payer: BC Managed Care – PPO

## 2022-07-22 ENCOUNTER — Encounter: Payer: Self-pay | Admitting: Nurse Practitioner

## 2022-07-22 ENCOUNTER — Inpatient Hospital Stay: Payer: BC Managed Care – PPO | Admitting: Nurse Practitioner

## 2022-07-22 ENCOUNTER — Inpatient Hospital Stay: Payer: BC Managed Care – PPO | Admitting: Oncology

## 2022-07-22 VITALS — BP 143/89 | HR 90 | Temp 98.1°F | Resp 18 | Ht 72.0 in | Wt 216.4 lb

## 2022-07-22 VITALS — BP 132/83 | HR 82

## 2022-07-22 DIAGNOSIS — Z5111 Encounter for antineoplastic chemotherapy: Secondary | ICD-10-CM | POA: Diagnosis not present

## 2022-07-22 DIAGNOSIS — D6959 Other secondary thrombocytopenia: Secondary | ICD-10-CM | POA: Diagnosis not present

## 2022-07-22 DIAGNOSIS — C186 Malignant neoplasm of descending colon: Secondary | ICD-10-CM

## 2022-07-22 DIAGNOSIS — Z5189 Encounter for other specified aftercare: Secondary | ICD-10-CM | POA: Diagnosis not present

## 2022-07-22 DIAGNOSIS — R110 Nausea: Secondary | ICD-10-CM | POA: Diagnosis not present

## 2022-07-22 DIAGNOSIS — C7951 Secondary malignant neoplasm of bone: Secondary | ICD-10-CM | POA: Diagnosis not present

## 2022-07-22 DIAGNOSIS — R809 Proteinuria, unspecified: Secondary | ICD-10-CM | POA: Diagnosis not present

## 2022-07-22 LAB — CMP (CANCER CENTER ONLY)
ALT: 17 U/L (ref 0–44)
AST: 17 U/L (ref 15–41)
Albumin: 3.9 g/dL (ref 3.5–5.0)
Alkaline Phosphatase: 193 U/L — ABNORMAL HIGH (ref 38–126)
Anion gap: 10 (ref 5–15)
BUN: 17 mg/dL (ref 6–20)
CO2: 23 mmol/L (ref 22–32)
Calcium: 8.1 mg/dL — ABNORMAL LOW (ref 8.9–10.3)
Chloride: 102 mmol/L (ref 98–111)
Creatinine: 0.97 mg/dL (ref 0.61–1.24)
GFR, Estimated: >60 mL/min (ref >60)
Glucose, Bld: 231 mg/dL — ABNORMAL HIGH (ref 70–99)
Potassium: 4.4 mmol/L (ref 3.5–5.1)
Sodium: 135 mmol/L (ref 135–145)
Total Bilirubin: 0.9 mg/dL (ref 0.3–1.2)
Total Protein: 6.8 g/dL (ref 6.5–8.1)

## 2022-07-22 LAB — CBC WITH DIFFERENTIAL (CANCER CENTER ONLY)
Abs Immature Granulocytes: 0.01 10*3/uL (ref 0.00–0.07)
Basophils Absolute: 0 10*3/uL (ref 0.0–0.1)
Basophils Relative: 1 %
Eosinophils Absolute: 0.2 10*3/uL (ref 0.0–0.5)
Eosinophils Relative: 5 %
HCT: 37.7 % — ABNORMAL LOW (ref 39.0–52.0)
Hemoglobin: 13.2 g/dL (ref 13.0–17.0)
Immature Granulocytes: 0 %
Lymphocytes Relative: 23 %
Lymphs Abs: 0.8 10*3/uL (ref 0.7–4.0)
MCH: 32.6 pg (ref 26.0–34.0)
MCHC: 35 g/dL (ref 30.0–36.0)
MCV: 93.1 fL (ref 80.0–100.0)
Monocytes Absolute: 0.3 10*3/uL (ref 0.1–1.0)
Monocytes Relative: 8 %
Neutro Abs: 2 10*3/uL (ref 1.7–7.7)
Neutrophils Relative %: 63 %
Platelet Count: 131 10*3/uL — ABNORMAL LOW (ref 150–400)
RBC: 4.05 MIL/uL — ABNORMAL LOW (ref 4.22–5.81)
RDW: 15.5 % (ref 11.5–15.5)
WBC Count: 3.3 10*3/uL — ABNORMAL LOW (ref 4.0–10.5)
nRBC: 0 % (ref 0.0–0.2)

## 2022-07-22 LAB — URINALYSIS, COMPLETE (UACMP) WITH MICROSCOPIC
Bacteria, UA: NONE SEEN
Bilirubin Urine: NEGATIVE
Glucose, UA: NEGATIVE mg/dL
Hgb urine dipstick: NEGATIVE
Ketones, ur: NEGATIVE mg/dL
Leukocytes,Ua: NEGATIVE
Nitrite: NEGATIVE
Protein, ur: 100 mg/dL — AB
Specific Gravity, Urine: 1.026 (ref 1.005–1.030)
pH: 6 (ref 5.0–8.0)

## 2022-07-22 LAB — CEA (ACCESS): CEA (CHCC): 2.92 ng/mL (ref 0.00–5.00)

## 2022-07-22 MED ORDER — SODIUM CHLORIDE 0.9 % IV SOLN: Freq: Once | INTRAVENOUS | Status: AC

## 2022-07-22 MED ORDER — PALONOSETRON HCL INJECTION 0.25 MG/5ML
0.2500 mg | Freq: Once | INTRAVENOUS | Status: AC
  Administered 2022-07-22: 0.25 mg via INTRAVENOUS
  Filled 2022-07-22: qty 5

## 2022-07-22 MED ORDER — SODIUM CHLORIDE 0.9 % IV SOLN
400.0000 mg/m2 | Freq: Once | INTRAVENOUS | Status: AC
  Administered 2022-07-22: 892 mg via INTRAVENOUS
  Filled 2022-07-22: qty 44.6

## 2022-07-22 MED ORDER — SODIUM CHLORIDE 0.9 % IV SOLN
2400.0000 mg/m2 | INTRAVENOUS | Status: DC
  Administered 2022-07-22: 5000 mg via INTRAVENOUS
  Filled 2022-07-22: qty 100

## 2022-07-22 MED ORDER — ATROPINE SULFATE 1 MG/ML IV SOLN
0.5000 mg | Freq: Once | INTRAVENOUS | Status: AC | PRN
  Administered 2022-07-22: 0.5 mg via INTRAVENOUS
  Filled 2022-07-22: qty 1

## 2022-07-22 MED ORDER — SODIUM CHLORIDE 0.9 % IV SOLN
10.0000 mg | Freq: Once | INTRAVENOUS | Status: AC
  Administered 2022-07-22: 10 mg via INTRAVENOUS
  Filled 2022-07-22: qty 10

## 2022-07-22 MED ORDER — SODIUM CHLORIDE 0.9 % IV SOLN
180.0000 mg/m2 | Freq: Once | INTRAVENOUS | Status: AC
  Administered 2022-07-22: 400 mg via INTRAVENOUS
  Filled 2022-07-22: qty 20

## 2022-07-22 MED ORDER — FLUOROURACIL CHEMO INJECTION 2.5 GM/50ML
400.0000 mg/m2 | Freq: Once | INTRAVENOUS | Status: AC
  Administered 2022-07-22: 900 mg via INTRAVENOUS
  Filled 2022-07-22: qty 18

## 2022-07-22 NOTE — Progress Notes (Signed)
Patient seen by Lonna Cobb NP today  Vitals are within treatment parameters.  Labs reviewed by Lonna Cobb NP and are not all within treatment parameters. Protein 100. Patient will received a 24 hour  urine protein  Per physician team, patient is ready for treatment. Please note that modifications are being made to the treatment plan including no avastin with treatment

## 2022-07-22 NOTE — Patient Instructions (Addendum)
[facility]  The chemotherapy medication bag should finish at 46 hours, 96 hours, or 7 days. For example, if your pump is scheduled for 46 hours and it was put on at 4:00 p.m., it should finish at 2:00 p.m. the day it is scheduled to come off regardless of your appointment time.     Estimated time to finish at 11:30 Thursday, July 24 2022.   If the display on your pump reads "Low Volume" and it is beeping, take the batteries out of the pump and come to the cancer center for it to be taken off.   If the pump alarms go off prior to the pump reading "Low Volume" then call [phone] and someone can assist you.  If the plunger comes out and the chemotherapy medication is leaking out, please use your home chemo spill kit to clean up the spill. Do NOT use paper towels or other household products.  If you have problems or questions regarding your pump, please call either [phone] (24 hours a day) or the cancer center Monday-Friday 8:00 a.m.- 4:30 p.m. at the clinic number and we will assist you. If you are unable to get assistance, then go to the nearest Emergency Department and ask the staff to contact the IV team for assistance.   Discharge Instructions: Thank you for choosing [facility] to provide your oncology and hematology care.   If you have a lab appointment with the Cancer Center, please go directly to the Cancer Center and check in at the registration area.   Wear comfortable clothing and clothing appropriate for easy access to any Portacath or PICC line.   We strive to give you quality time with your provider. You may need to reschedule your appointment if you arrive late (15 or more minutes).  Arriving late affects you and other patients whose appointments are after yours.  Also, if you miss three or more appointments without notifying the office, you may be dismissed from the clinic at the provider's discretion.      For  prescription refill requests, have your pharmacy contact our office and allow 72 hours for refills to be completed.    Today you received the following chemotherapy and/or immunotherapy agents Irinotecan, Leucovorin, Fluorouracil.      To help prevent nausea and vomiting after your treatment, we encourage you to take your nausea medication as directed.  BELOW ARE SYMPTOMS THAT SHOULD BE REPORTED IMMEDIATELY: *FEVER GREATER THAN 100.4 F (38 C) OR HIGHER *CHILLS OR SWEATING *NAUSEA AND VOMITING THAT IS NOT CONTROLLED WITH YOUR NAUSEA MEDICATION *UNUSUAL SHORTNESS OF BREATH *UNUSUAL BRUISING OR BLEEDING *URINARY PROBLEMS (pain or burning when urinating, or frequent urination) *BOWEL PROBLEMS (unusual diarrhea, constipation, pain near the anus) TENDERNESS IN MOUTH AND THROAT WITH OR WITHOUT PRESENCE OF ULCERS (sore throat, sores in mouth, or a toothache) UNUSUAL RASH, SWELLING OR PAIN  UNUSUAL VAGINAL DISCHARGE OR ITCHING   Items with * indicate a potential emergency and should be followed up as soon as possible or go to the Emergency Department if any problems should occur.  Please show the CHEMOTHERAPY ALERT CARD or IMMUNOTHERAPY ALERT CARD at check-in to the Emergency Department and triage nurse.  Should you have questions after your visit or need to cancel or reschedule your appointment, please contact [facility]  Dept: [phone]  and follow the prompts.  Office hours are 8:00 a.m. to 4:30 p.m. Monday - Friday. Please note  that voicemails left after 4:00 p.m. may not be returned until the following business day.  We are closed weekends and major holidays. You have access to a nurse at all times for urgent questions. Please call the main number to the clinic Dept: [phone] and follow the prompts.   For any non-urgent questions, you may also contact your provider using MyChart. We now offer e-Visits for anyone 91 and older to request care online for  non-urgent symptoms. For details visit [email or web address].   Also download the MyChart app! Go to the app store, search "MyChart", open the app, select [facility], and log in with your MyChart username and password.  Irinotecan Injection What is this medication? IRINOTECAN (ir in oh TEE kan) treats some types of cancer. It works by slowing down the growth of cancer cells. This medicine may be used for other purposes; ask your health care provider or pharmacist if you have questions. COMMON BRAND NAME(S): Camptosar What should I tell my care team before I take this medication? They need to know if you have any of these conditions: Dehydration Diarrhea Infection, especially a viral infection, such as chickenpox, cold sores, herpes Liver disease Low blood cell levels (white cells, red cells, and platelets) Low levels of electrolytes, such as calcium, magnesium, or potassium in your blood Recent or ongoing radiation An unusual or allergic reaction to irinotecan, other medications, foods, dyes, or preservatives If you or your partner are pregnant or trying to get pregnant Breast-feeding How should I use this medication? This medication is injected into a vein. It is given by your care team in a hospital or clinic setting. Talk to your care team about the use of this medication in children. Special care may be needed. Overdosage: If you think you have taken too much of this medicine contact a poison control center or emergency room at once. NOTE: This medicine is only for you. Do not share this medicine with others. What if I miss a dose? Keep appointments for follow-up doses. It is important not to miss your dose. Call your care team if you are unable to keep an appointment. What may interact with this medication? Do not take this medication with any of the following: Cobicistat Itraconazole This medication may also interact with the following: Certain antibiotics, such as  clarithromycin, rifampin, rifabutin Certain antivirals for HIV or AIDS Certain medications for fungal infections, such as ketoconazole, posaconazole, voriconazole Certain medications for seizures, such as carbamazepine, phenobarbital, phenytoin Gemfibrozil Nefazodone St. John's wort This list may not describe all possible interactions. Give your health care provider a list of all the medicines, herbs, non-prescription drugs, or dietary supplements you use. Also tell them if you smoke, drink alcohol, or use illegal drugs. Some items may interact with your medicine. What should I watch for while using this medication? Your condition will be monitored carefully while you are receiving this medication. You may need blood work while taking this medication. This medication may make you feel generally unwell. This is not uncommon as chemotherapy can affect healthy cells as well as cancer cells. Report any side effects. Continue your course of treatment even though you feel ill unless your care team tells you to stop. This medication can cause serious side effects. To reduce the risk, your care team may give you other medications to take before receiving this one. Be sure to follow the directions from your care team. This medication may affect your coordination, reaction time, or judgement. Do not drive or  operate machinery until you know how this medication affects you. Sit up or stand slowly to reduce the risk of dizzy or fainting spells. Drinking alcohol with this medication can increase the risk of these side effects. This medication may increase your risk of getting an infection. Call your care team for advice if you get a fever, chills, sore throat, or other symptoms of a cold or flu. Do not treat yourself. Try to avoid being around people who are sick. Avoid taking medications that contain aspirin, acetaminophen, ibuprofen, naproxen, or ketoprofen unless instructed by your care team. These medications  may hide a fever. This medication may increase your risk to bruise or bleed. Call your care team if you notice any unusual bleeding. Be careful brushing or flossing your teeth or using a toothpick because you may get an infection or bleed more easily. If you have any dental work done, tell your dentist you are receiving this medication. Talk to your care team if you or your partner are pregnant or think either of you might be pregnant. This medication can cause serious birth defects if taken during pregnancy and for 6 months after the last dose. You will need a negative pregnancy test before starting this medication. Contraception is recommended while taking this medication and for 6 months after the last dose. Your care team can help you find the option that works for you. Do not father a child while taking this medication and for 3 months after the last dose. Use a condom for contraception during this time period. Do not breastfeed while taking this medication and for 7 days after the last dose. This medication may cause infertility. Talk to your care team if you are concerned about your fertility. What side effects may I notice from receiving this medication? Side effects that you should report to your care team as soon as possible: Allergic reactions--skin rash, itching, hives, swelling of the face, lips, tongue, or throat Dry cough, shortness of breath or trouble breathing Increased saliva or tears, increased sweating, stomach cramping, diarrhea, small pupils, unusual weakness or fatigue, slow heartbeat Infection--fever, chills, cough, sore throat, wounds that don't heal, pain or trouble when passing urine, general feeling of discomfort or being unwell Kidney injury--decrease in the amount of urine, swelling of the ankles, hands, or feet Low red blood cell level--unusual weakness or fatigue, dizziness, headache, trouble breathing Severe or prolonged diarrhea Unusual bruising or bleeding Side  effects that usually do not require medical attention (report to your care team if they continue or are bothersome): Constipation Diarrhea Hair loss Loss of appetite Nausea Stomach pain This list may not describe all possible side effects. Call your doctor for medical advice about side effects. You may report side effects to FDA at 1-800-FDA-1088. Where should I keep my medication? This medication is given in a hospital or clinic. It will not be stored at home. NOTE: This sheet is a summary. It may not cover all possible information. If you have questions about this medicine, talk to your doctor, pharmacist, or health care provider.  2023 Elsevier/Gold Standard (2021-07-25 00:00:00)  Leucovorin Injection What is this medication? LEUCOVORIN (loo koe VOR in) prevents side effects from certain medications, such as methotrexate. It works by increasing folate levels. This helps protect healthy cells in your body. It may also be used to treat anemia caused by low levels of folate. It can also be used with fluorouracil, a type of chemotherapy, to treat colorectal cancer. It works by increasing the  effects of fluorouracil in the body. This medicine may be used for other purposes; ask your health care provider or pharmacist if you have questions. What should I tell my care team before I take this medication? They need to know if you have any of these conditions: Anemia from low levels of vitamin B12 in the blood An unusual or allergic reaction to leucovorin, folic acid, other medications, foods, dyes, or preservatives Pregnant or trying to get pregnant Breastfeeding How should I use this medication? This medication is injected into a vein or a muscle. It is given by your care team in a hospital or clinic setting. Talk to your care team about the use of this medication in children. Special care may be needed. Overdosage: If you think you have taken too much of this medicine contact a poison control  center or emergency room at once. NOTE: This medicine is only for you. Do not share this medicine with others. What if I miss a dose? Keep appointments for follow-up doses. It is important not to miss your dose. Call your care team if you are unable to keep an appointment. What may interact with this medication? Capecitabine Fluorouracil Phenobarbital Phenytoin Primidone Trimethoprim;sulfamethoxazole This list may not describe all possible interactions. Give your health care provider a list of all the medicines, herbs, non-prescription drugs, or dietary supplements you use. Also tell them if you smoke, drink alcohol, or use illegal drugs. Some items may interact with your medicine. What should I watch for while using this medication? Your condition will be monitored carefully while you are receiving this medication. This medication may increase the side effects of 5-fluorouracil. Tell your care team if you have diarrhea or mouth sores that do not get better or that get worse. What side effects may I notice from receiving this medication? Side effects that you should report to your care team as soon as possible: Allergic reactions--skin rash, itching, hives, swelling of the face, lips, tongue, or throat This list may not describe all possible side effects. Call your doctor for medical advice about side effects. You may report side effects to FDA at 1-800-FDA-1088. Where should I keep my medication? This medication is given in a hospital or clinic. It will not be stored at home. NOTE: This sheet is a summary. It may not cover all possible information. If you have questions about this medicine, talk to your doctor, pharmacist, or health care provider.  2023 Elsevier/Gold Standard (2021-07-26 00:00:00)  Fluorouracil Injection What is this medication? FLUOROURACIL (flure oh YOOR a sil) treats some types of cancer. It works by slowing down the growth of cancer cells. This medicine may be used  for other purposes; ask your health care provider or pharmacist if you have questions. COMMON BRAND NAME(S): Adrucil What should I tell my care team before I take this medication? They need to know if you have any of these conditions: Blood disorders Dihydropyrimidine dehydrogenase (DPD) deficiency Infection, such as chickenpox, cold sores, herpes Kidney disease Liver disease Poor nutrition Recent or ongoing radiation therapy An unusual or allergic reaction to fluorouracil, other medications, foods, dyes, or preservatives If you or your partner are pregnant or trying to get pregnant Breast-feeding How should I use this medication? This medication is injected into a vein. It is administered by your care team in a hospital or clinic setting. Talk to your care team about the use of this medication in children. Special care may be needed. Overdosage: If you think you have taken  too much of this medicine contact a poison control center or emergency room at once. NOTE: This medicine is only for you. Do not share this medicine with others. What if I miss a dose? Keep appointments for follow-up doses. It is important not to miss your dose. Call your care team if you are unable to keep an appointment. What may interact with this medication? Do not take this medication with any of the following: Live virus vaccines This medication may also interact with the following: Medications that treat or prevent blood clots, such as warfarin, enoxaparin, dalteparin This list may not describe all possible interactions. Give your health care provider a list of all the medicines, herbs, non-prescription drugs, or dietary supplements you use. Also tell them if you smoke, drink alcohol, or use illegal drugs. Some items may interact with your medicine. What should I watch for while using this medication? Your condition will be monitored carefully while you are receiving this medication. This medication may make  you feel generally unwell. This is not uncommon as chemotherapy can affect healthy cells as well as cancer cells. Report any side effects. Continue your course of treatment even though you feel ill unless your care team tells you to stop. In some cases, you may be given additional medications to help with side effects. Follow all directions for their use. This medication may increase your risk of getting an infection. Call your care team for advice if you get a fever, chills, sore throat, or other symptoms of a cold or flu. Do not treat yourself. Try to avoid being around people who are sick. This medication may increase your risk to bruise or bleed. Call your care team if you notice any unusual bleeding. Be careful brushing or flossing your teeth or using a toothpick because you may get an infection or bleed more easily. If you have any dental work done, tell your dentist you are receiving this medication. Avoid taking medications that contain aspirin, acetaminophen, ibuprofen, naproxen, or ketoprofen unless instructed by your care team. These medications may hide a fever. Do not treat diarrhea with over the counter products. Contact your care team if you have diarrhea that lasts more than 2 days or if it is severe and watery. This medication can make you more sensitive to the sun. Keep out of the sun. If you cannot avoid being in the sun, wear protective clothing and sunscreen. Do not use sun lamps, tanning beds, or tanning booths. Talk to your care team if you or your partner wish to become pregnant or think you might be pregnant. This medication can cause serious birth defects if taken during pregnancy and for 3 months after the last dose. A reliable form of contraception is recommended while taking this medication and for 3 months after the last dose. Talk to your care team about effective forms of contraception. Do not father a child while taking this medication and for 3 months after the last dose.  Use a condom while having sex during this time period. Do not breastfeed while taking this medication. This medication may cause infertility. Talk to your care team if you are concerned about your fertility. What side effects may I notice from receiving this medication? Side effects that you should report to your care team as soon as possible: Allergic reactions--skin rash, itching, hives, swelling of the face, lips, tongue, or throat Heart attack--pain or tightness in the chest, shoulders, arms, or jaw, nausea, shortness of breath, cold or clammy skin,  feeling faint or lightheaded Heart failure--shortness of breath, swelling of the ankles, feet, or hands, sudden weight gain, unusual weakness or fatigue Heart rhythm changes--fast or irregular heartbeat, dizziness, feeling faint or lightheaded, chest pain, trouble breathing High ammonia level--unusual weakness or fatigue, confusion, loss of appetite, nausea, vomiting, seizures Infection--fever, chills, cough, sore throat, wounds that don't heal, pain or trouble when passing urine, general feeling of discomfort or being unwell Low red blood cell level--unusual weakness or fatigue, dizziness, headache, trouble breathing Pain, tingling, or numbness in the hands or feet, muscle weakness, change in vision, confusion or trouble speaking, loss of balance or coordination, trouble walking, seizures Redness, swelling, and blistering of the skin over hands and feet Severe or prolonged diarrhea Unusual bruising or bleeding Side effects that usually do not require medical attention (report to your care team if they continue or are bothersome): Dry skin Headache Increased tears Nausea Pain, redness, or swelling with sores inside the mouth or throat Sensitivity to light Vomiting This list may not describe all possible side effects. Call your doctor for medical advice about side effects. You may report side effects to FDA at 1-800-FDA-1088. Where should I  keep my medication? This medication is given in a hospital or clinic. It will not be stored at home. NOTE: This sheet is a summary. It may not cover all possible information. If you have questions about this medicine, talk to your doctor, pharmacist, or health care provider.  2023 Elsevier/Gold Standard (2021-07-16 00:00:00)

## 2022-07-22 NOTE — Progress Notes (Signed)
[facility] OFFICE PROGRESS NOTE   Diagnosis: Colon cancer  INTERVAL HISTORY:   [name] returns as scheduled.  He completed cycle 4 FOLFIRI 07/08/2022.  Avastin was held due to proteinuria.  He had mild nausea for 2 to 3 days, mainly at nighttime.  No mouth sores.  No diarrhea.  He notes blood with nose blowing usually when he first gets up in the morning hours.  Recent stuffy nose and chest congestion.  No fever.  No change in baseline dyspnea on exertion.  Objective:  Vital signs in last 24 hours:  Blood pressure (!) 143/89, pulse 90, temperature 98.1 F (36.7 C), resp. rate 18, height 6' (1.829 m), weight 216 lb 6.4 oz (98.2 kg), SpO2 100 %.    HEENT: No thrush or ulcers. Resp: Lungs clear bilaterally. Cardio: Regular rate and rhythm. GI: Abdomen soft and nontender.  No hepatosplenomegaly. Vascular: No leg edema.  Left lower leg is slightly larger than the right lower leg. Skin: Palms without erythema. Port-A-Cath without erythema.  Lab Results:  Lab Results  Component Value Date   WBC 3.3 (L) 07/22/2022   HGB 13.2 07/22/2022   HCT 37.7 (L) 07/22/2022   MCV 93.1 07/22/2022   PLT 131 (L) 07/22/2022   NEUTROABS 2.0 07/22/2022    Imaging:  No results found.  Medications: I have reviewed the patient's current medications.  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, status post a left colectomy 03/04/2019 Mass felt to be in the transverse colon on a colonoscopy 01/07/2019 with a biopsy confirming poorly differentiated adenocarcinoma with signet ring cell features, mismatch repair protein expression normal CT abdomen/pelvis 01/08/2019 12.3 cm indeterminate splenic mass, present in 2015 on a lumbar MRI-felt to be benign, small subpleural and pleural-based nodules at the lung bases, status post left nephrectomy CT chest 01/20/2019-small bilateral  pulmonary nodules with rounded parenchymal nodules in the right lower lobe, indeterminate splenic mass CT chest 03/18/2019-multiple subcentimeter pulmonary nodules again seen bilaterally, greatest in the lower lobes, without significant change.  No new or enlarging pulmonary nodules or masses.  Partially visualized large heterogeneous splenic mass with no significant change.  Plan for follow-up chest CT at a 59-month interval. Cycle 1 FOLFOX 03/21/2019 Cycle 2 FOLFOX 04/04/2019 Cycle 3 FOLFOX 04/18/2019 (oxaliplatin dose reduced secondary to thrombocytopenia) Cycle 4 FOLFOX 05/02/2019 (oxaliplatin held secondary to thrombocytopenia) Cycle 5 FOLFOX 05/16/2019 Cycle 6 FOLFOX 05/30/2019 (oxaliplatin held secondary to thrombocytopenia) CT chest 06/08/2019-stable scattered small solid pulmonary nodules  Cycle 7 FOLFOX 06/13/2019 Cycle 8 FOLFOX 06/27/2019 Cycle 9 FOLFOX 07/11/2019 Cycle 10 FOLFOX 07/25/2019 (oxaliplatin held due to thrombocytopenia) Cycle 11 FOLFOX 08/08/2019 Cycle 12 FOLFOX 08/22/2019 CTs 01/09/2020-no evidence of recurrent disease, stable lung nodules favored to represent subpleural lymph nodes-dedicated follow-up not recommended CTs 01/11/2021-no evidence of recurrent disease CTs 01/24/2022-no evidence of recurrent disease CT lumbar spine, abdomen/pelvis 04/11/2022-numerous mixed lytic and sclerotic lesions scattered throughout the visualized axial and appendicular skeleton including several areas in the lumbar spine.   04/21/2022 CT biopsy sclerotic lesion right anterior iliac bone-consistent with metastatic colorectal adenocarcinoma, moderate to poorly differentiated.  Foundation 1-microsatellite stable, tumor mutation burden 4, K-ras G12S PET scan 05/09/2022-widespread hypermetabolic mixed faintly lytic and sclerotic osseous metastases throughout the axial and proximal appendicular skeleton; associated mild pathologic compression fracture at L3; no hypermetabolic extraosseous metastatic  disease. 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  Left nephrectomy at age 84 12.  Port-A-Cath placement, Dr. Michaell Cowing, 03/16/2019 13.  Thrombocytopenia secondary to chemotherapy-oxaliplatin dose reduced beginning with cycle 3 FOLFOX, improved 14.  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  Right groin soft tissue infection May 2023-status postsurgical debridement 18.  Left leg DVT-common femoral, femoral, popliteal, posterior tibial, and peroneal veins 19.  02/04/2022-MRI cervical spine-1.7 cm T1 lesion-indeterminate; bone scan 03/14/2022-focal intense activity at approximate T1 level felt to correspond to the lesion on MRI, additional foci of activity involving the lower thoracic and lumbar spine, right iliac bone and pubic symphysis.  CT lumbar spine, abdomen/pelvis 04/11/2022-numerous mixed lytic and sclerotic lesions scattered throughout the visualized axial and appendicular skeleton including several areas in the lumbar spine.  04/21/2022 CT biopsy sclerotic lesion right anterior iliac bone-consistent with metastatic colorectal adenocarcinoma, moderate to poorly  differentiated    Disposition: [name] appears stable.  He has completed 4 cycles of FOLFIRI.  Bevacizumab held with cycle 4 due to proteinuria.  Urine protein remains elevated.  Plan to proceed with FOLFIRI today as scheduled, hold bevacizumab.  He will complete a 24-hour urine for total protein.  He will receive white cell growth factor support with this cycle.  We reviewed potential side effects including bone pain, rash, splenic rupture.  He agrees with the above plan.  Restaging CTs prior to next office visit.  CBC and chemistry panel reviewed.  Labs adequate to proceed with treatment as scheduled.  ANC is at the lower end of the normal range.  He will receive white cell growth factor support on day of pump discontinuation.  He will return for follow-up in 2 weeks.  We are available to see him sooner if needed.    Lonna Cobb ANP/GNP-BC   07/22/2022  9:13 AM

## 2022-07-23 IMAGING — CT CT ABD-PELV W/ CM
2 of 5 series · 12 of 36 positions shown, 15 images · IV contrast (omnipaque)
Comparison: CT chest dated 06/08/2019. CT abdomen/pelvis dated
01/08/2019.

CLINICAL DATA: Restaging colon cancer, chemotherapy complete. Prior
left nephrectomy for Wilms tumor. History of thyroid cancer, status
post partial thyroidectomy.

EXAM:
CT CHEST, ABDOMEN, AND PELVIS WITH CONTRAST
TECHNIQUE: Multidetector CT imaging of the chest, abdomen and pelvis was
performed following the standard protocol during bolus
administration of intravenous contrast.
CONTRAST:  100mL OMNIPAQUE IOHEXOL 300 MG/ML  SOLN

[Series 2: cap with · axial · 0.75mm/px · z∈[-595,-75]mm · 9 of 130 slices shown, 12 images]
[im 13/130  mediastinal]
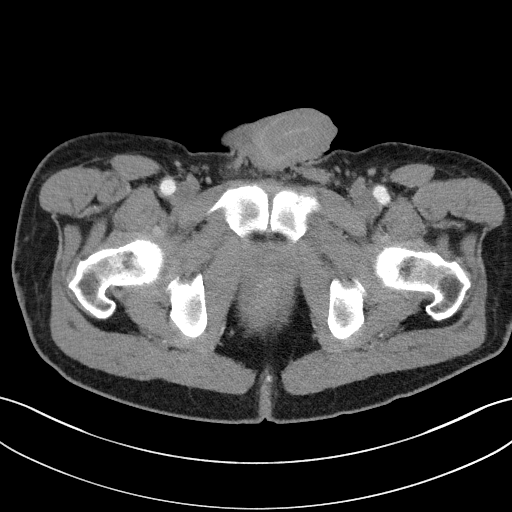
[im 13/130  lung]
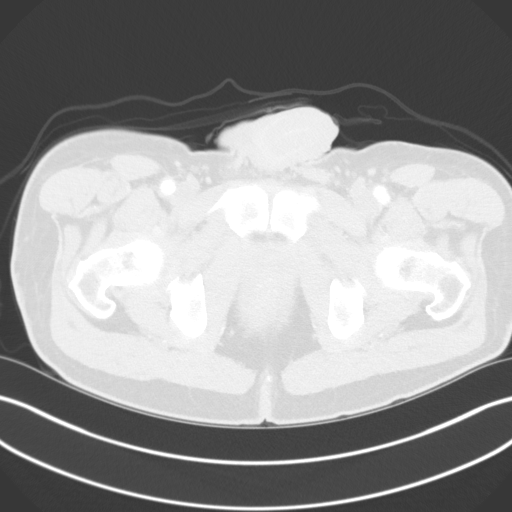
[im 26/130  lung]
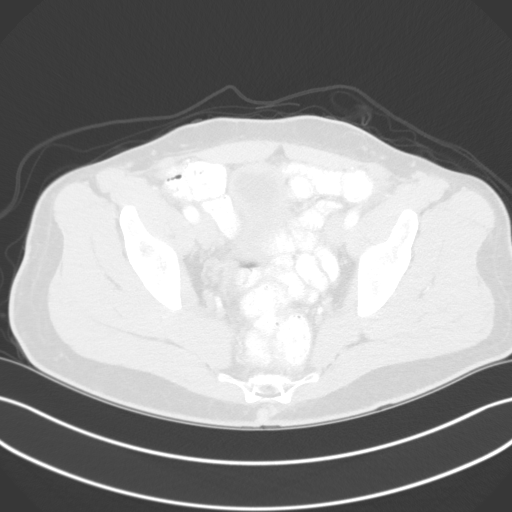
[im 39/130  lung]
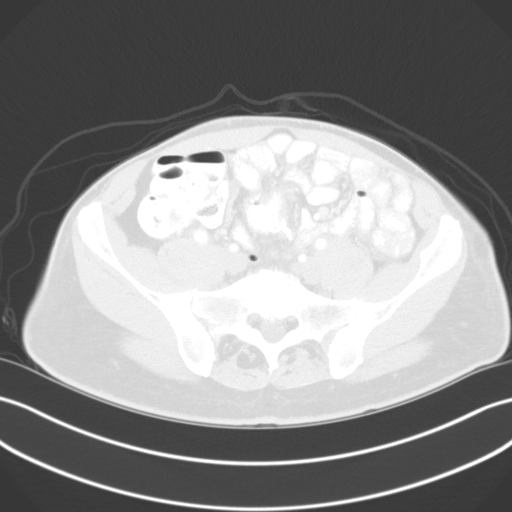
[im 52/130  lung]
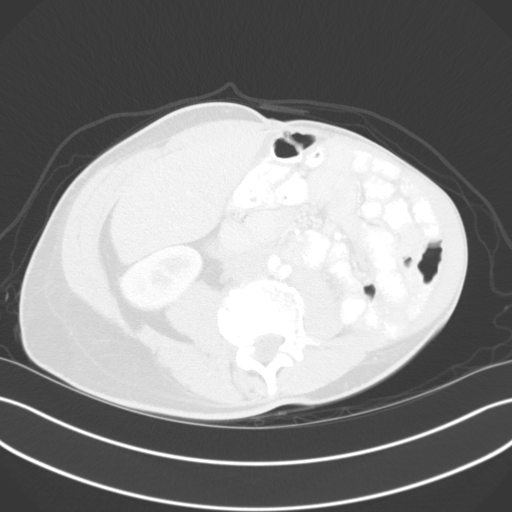
[im 65/130  mediastinal]
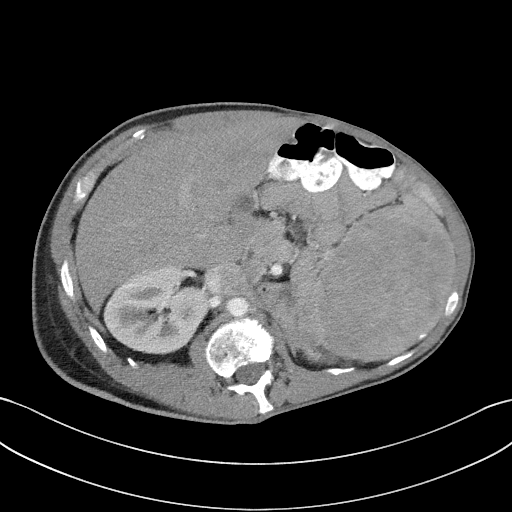
[im 65/130  lung]
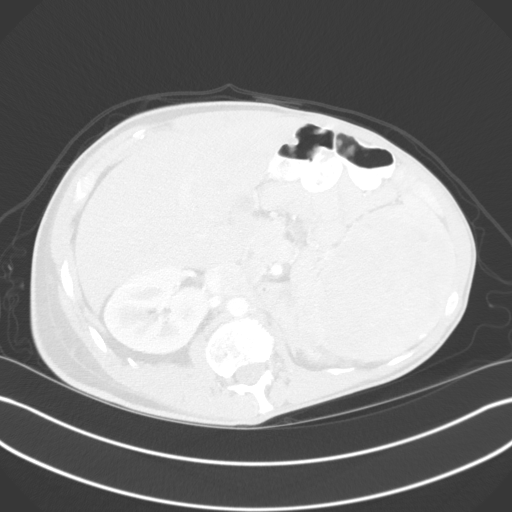
[im 78/130  lung]
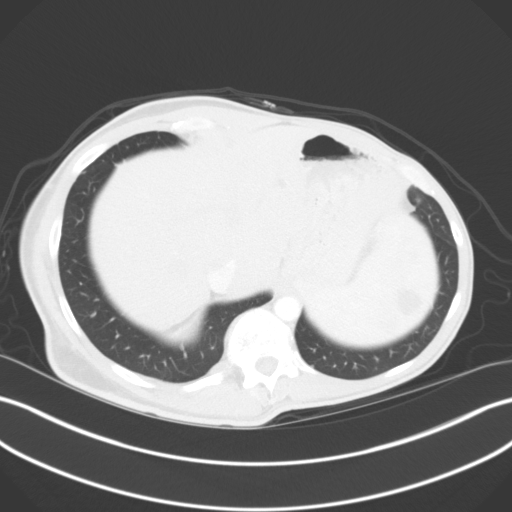
[im 91/130  lung]
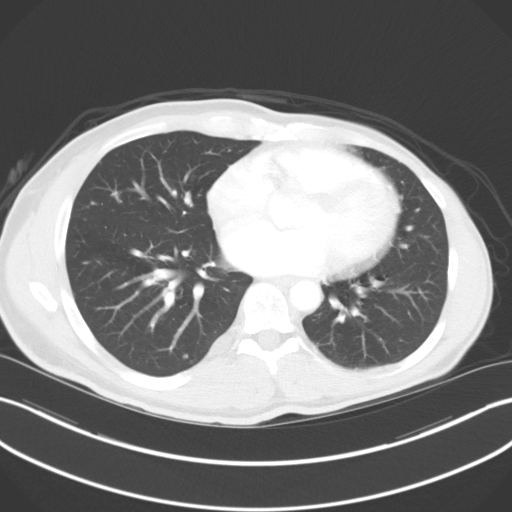
[im 104/130  lung]
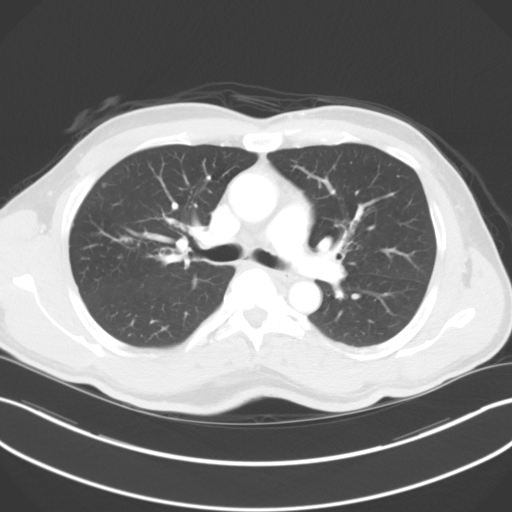
[im 117/130  mediastinal]
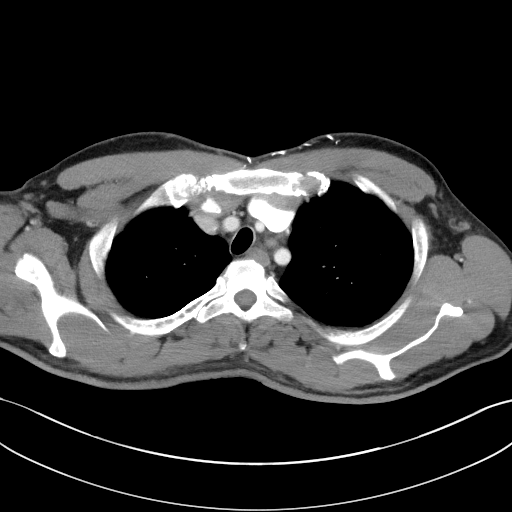
[im 117/130  lung]
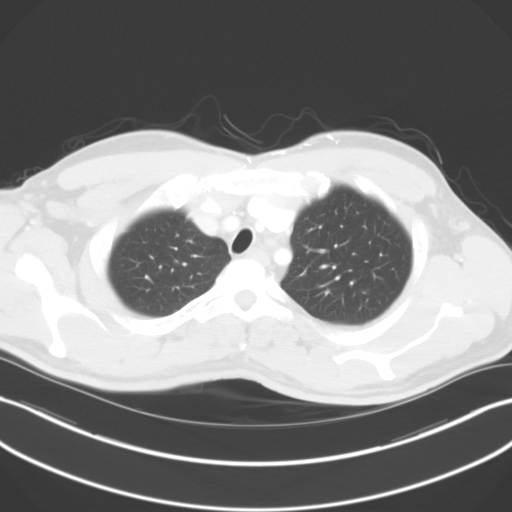

[Series 4: coronals · coronal · 0.86mm/px · 3 of 150 slices shown]
[im 30/150  lung]
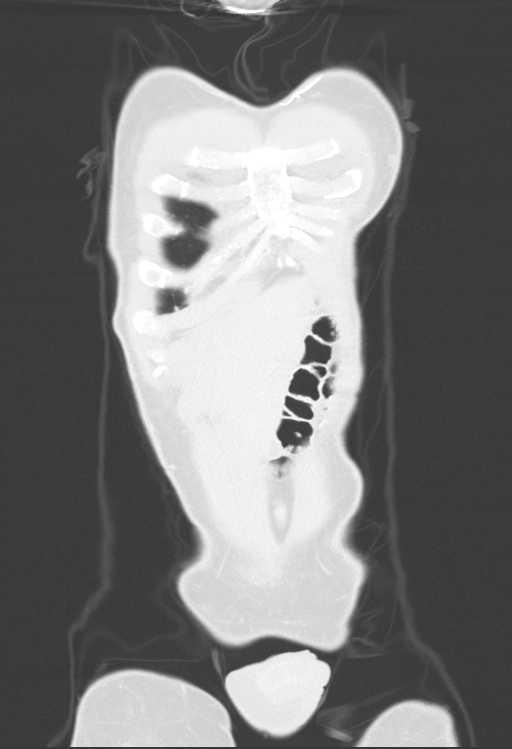
[im 60/150  lung]
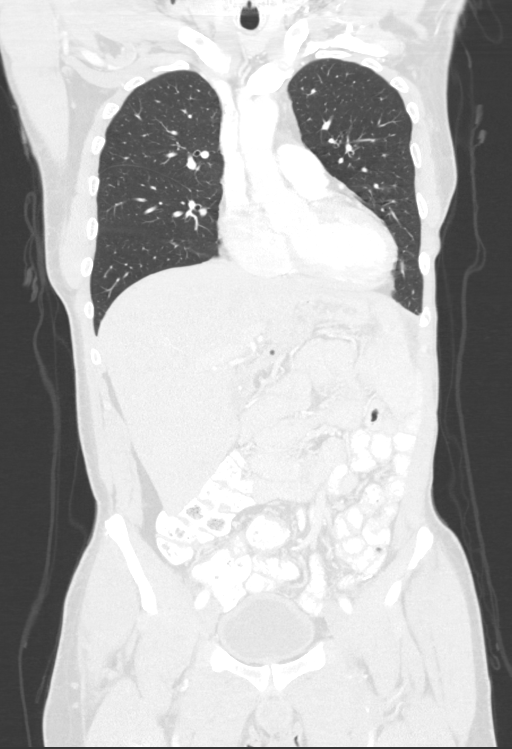
[im 90/150  lung]
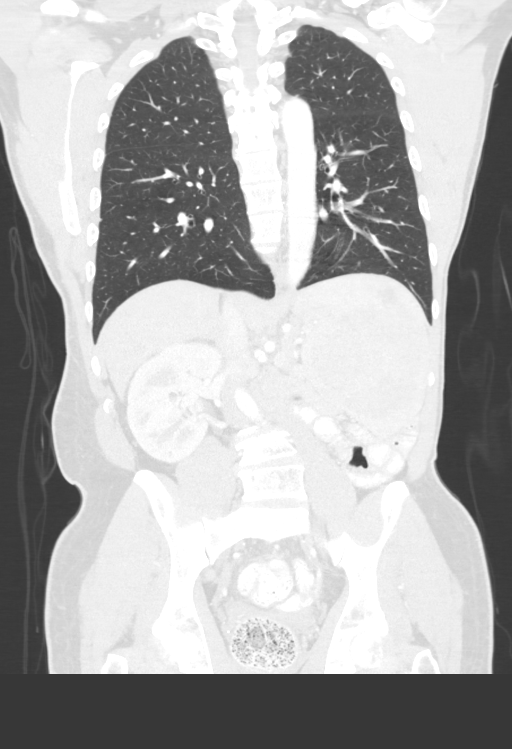

[12 of 36 positions shown; findings below may reference images not displayed]

FINDINGS: CT CHEST FINDINGS

Cardiovascular: Heart is normal in size.  No pericardial effusion.

No evidence of thoracic aortic aneurysm. Mild atherosclerotic
calcifications of the aortic arch.

Mediastinum/Nodes: No suspicious mediastinal lymphadenopathy.

Status post right thyroidectomy.

Lungs/Pleura: Again seen are numerous pleural-based/subpleural
nodules in the lungs bilaterally, many of which are triangular
subpleural lymph nodes, measuring up to 4-5 mm. These are unchanged
from December 2018 and can be considered benign at this point,
despite the patient's clinical history.

No new/suspicious pulmonary nodules.

Scattered areas of bronchiectasis in the bilateral upper lobes (for
example series 6/image 62). No focal consolidation.

No pleural effusion or pneumothorax.

Musculoskeletal: No focal osseous lesions.

CT ABDOMEN PELVIS FINDINGS

Hepatobiliary: Tiny hepatic cysts measuring up to 7 mm in the left
hepatic lobe (series 2/image 53).

Gallbladder is unremarkable. No intrahepatic or extrahepatic ductal
dilatation.

Pancreas: Within normal limits.

Spleen: Rounded, heterogeneously enhancing central splenic mass
measuring 12.1 x 9.4 cm (series 2/image 63), unchanged from priors.
This appears to have been present on 4022 lumbar spine MRI, favoring
a benign etiology such as splenic hamartoma or hemangioma.

Adrenals/Urinary Tract: Bilateral adrenal glands are within normal
limits.

Status post left nephrectomy.

Right kidney is within normal limits.  No hydronephrosis.

Thick-walled bladder, although underdistended.

Stomach/Bowel: Stomach is within normal limits.

No evidence of bowel obstruction.

Normal appendix (series 2/image 93).

Status post partial colon resection with suture line in the central
lower abdomen (series 2/image 34).

No colonic wall thickening or mass is evident on CT.

Vascular/Lymphatic: No evidence of abdominal aortic aneurysm.

Atherosclerotic calcifications of the abdominal aorta and branch
vessels.

No suspicious abdominopelvic lymphadenopathy.

Reproductive: Prostate is grossly unremarkable.

Other: No abdominopelvic ascites.

Tiny fat containing right inguinal hernia.

Musculoskeletal: Scattered fat lobules along the subcutaneous wall
bilaterally (for example series 2/image 99 and 110), more
conspicuous than on priors, but presumably still benign.

Degenerative changes of the lumbar spine.
IMPRESSION: Status post partial colon resection. No findings suspicious for
recurrent or metastatic disease.

Numerous bilateral pulmonary nodules are unchanged x 12 months,
compatible with benign subpleural lymph nodes. [HOSPITAL]
guidelines do not apply, but these are not considered suspicious for
metastases, and do not require additional dedicated follow-up.

Prior right thyroidectomy and left nephrectomy.

12.1 cm rounded splenic mass, unchanged, reasonably reflecting a
benign splenic hamartoma or hemangioma.

Additional ancillary findings as above.

## 2022-07-24 ENCOUNTER — Inpatient Hospital Stay: Payer: BC Managed Care – PPO

## 2022-07-24 VITALS — BP 133/81 | HR 79 | Temp 99.0°F | Resp 18

## 2022-07-24 DIAGNOSIS — R809 Proteinuria, unspecified: Secondary | ICD-10-CM | POA: Diagnosis not present

## 2022-07-24 DIAGNOSIS — R110 Nausea: Secondary | ICD-10-CM | POA: Diagnosis not present

## 2022-07-24 DIAGNOSIS — C186 Malignant neoplasm of descending colon: Secondary | ICD-10-CM

## 2022-07-24 DIAGNOSIS — Z5189 Encounter for other specified aftercare: Secondary | ICD-10-CM | POA: Diagnosis not present

## 2022-07-24 DIAGNOSIS — D6959 Other secondary thrombocytopenia: Secondary | ICD-10-CM | POA: Diagnosis not present

## 2022-07-24 DIAGNOSIS — C7951 Secondary malignant neoplasm of bone: Secondary | ICD-10-CM | POA: Diagnosis not present

## 2022-07-24 DIAGNOSIS — Z5111 Encounter for antineoplastic chemotherapy: Secondary | ICD-10-CM | POA: Diagnosis not present

## 2022-07-24 MED ORDER — SODIUM CHLORIDE 0.9% FLUSH
10.0000 mL | INTRAVENOUS | Status: DC | PRN
  Administered 2022-07-24: 10 mL

## 2022-07-24 MED ORDER — PEGFILGRASTIM-CBQV 6 MG/0.6ML ~~LOC~~ SOSY
6.0000 mg | PREFILLED_SYRINGE | Freq: Once | SUBCUTANEOUS | Status: AC
  Administered 2022-07-24: 6 mg via SUBCUTANEOUS
  Filled 2022-07-24: qty 0.6

## 2022-07-24 MED ORDER — HEPARIN SOD (PORK) LOCK FLUSH 100 UNIT/ML IV SOLN
500.0000 [IU] | Freq: Once | INTRAVENOUS | Status: AC | PRN
  Administered 2022-07-24: 500 [IU]

## 2022-07-24 NOTE — Patient Instructions (Signed)

## 2022-07-25 DIAGNOSIS — C186 Malignant neoplasm of descending colon: Secondary | ICD-10-CM | POA: Diagnosis not present

## 2022-07-31 ENCOUNTER — Ambulatory Visit (HOSPITAL_BASED_OUTPATIENT_CLINIC_OR_DEPARTMENT_OTHER)
Admission: RE | Admit: 2022-07-31 | Discharge: 2022-07-31 | Disposition: A | Discharge status: Home / Self Care | Payer: BC Managed Care – PPO | Source: Ambulatory Visit | Attending: Nurse Practitioner | Admitting: Nurse Practitioner

## 2022-07-31 ENCOUNTER — Telehealth: Payer: Self-pay | Admitting: Oncology

## 2022-07-31 DIAGNOSIS — C186 Malignant neoplasm of descending colon: Secondary | ICD-10-CM

## 2022-07-31 DIAGNOSIS — R918 Other nonspecific abnormal finding of lung field: Secondary | ICD-10-CM | POA: Diagnosis not present

## 2022-07-31 DIAGNOSIS — C189 Malignant neoplasm of colon, unspecified: Secondary | ICD-10-CM | POA: Diagnosis not present

## 2022-07-31 DIAGNOSIS — K8020 Calculus of gallbladder without cholecystitis without obstruction: Secondary | ICD-10-CM | POA: Diagnosis not present

## 2022-07-31 DIAGNOSIS — J479 Bronchiectasis, uncomplicated: Secondary | ICD-10-CM | POA: Diagnosis not present

## 2022-08-02 ENCOUNTER — Other Ambulatory Visit: Payer: Self-pay | Admitting: Oncology

## 2022-08-03 ENCOUNTER — Other Ambulatory Visit (HOSPITAL_COMMUNITY)
Admission: RE | Admit: 2022-08-03 | Discharge: 2022-08-03 | Disposition: A | Discharge status: Home / Self Care | Payer: BC Managed Care – PPO | Attending: Oncology | Admitting: Oncology

## 2022-08-03 DIAGNOSIS — C186 Malignant neoplasm of descending colon: Secondary | ICD-10-CM | POA: Diagnosis not present

## 2022-08-04 ENCOUNTER — Inpatient Hospital Stay: Payer: BC Managed Care – PPO | Admitting: Nurse Practitioner

## 2022-08-04 ENCOUNTER — Inpatient Hospital Stay: Payer: BC Managed Care – PPO

## 2022-08-04 ENCOUNTER — Other Ambulatory Visit (HOSPITAL_BASED_OUTPATIENT_CLINIC_OR_DEPARTMENT_OTHER): Payer: Self-pay

## 2022-08-04 ENCOUNTER — Inpatient Hospital Stay: Payer: BC Managed Care – PPO | Attending: Oncology

## 2022-08-04 ENCOUNTER — Encounter: Payer: Self-pay | Admitting: Nurse Practitioner

## 2022-08-04 VITALS — BP 146/83 | HR 85 | Temp 98.2°F | Resp 18 | Ht 72.0 in | Wt 218.7 lb

## 2022-08-04 DIAGNOSIS — C186 Malignant neoplasm of descending colon: Secondary | ICD-10-CM | POA: Diagnosis not present

## 2022-08-04 DIAGNOSIS — C7951 Secondary malignant neoplasm of bone: Secondary | ICD-10-CM | POA: Diagnosis not present

## 2022-08-04 DIAGNOSIS — Z5189 Encounter for other specified aftercare: Secondary | ICD-10-CM | POA: Diagnosis not present

## 2022-08-04 DIAGNOSIS — Z5111 Encounter for antineoplastic chemotherapy: Secondary | ICD-10-CM | POA: Diagnosis not present

## 2022-08-04 LAB — CBC WITH DIFFERENTIAL (CANCER CENTER ONLY)
Abs Immature Granulocytes: 0.5 10*3/uL — ABNORMAL HIGH (ref 0.00–0.07)
Band Neutrophils: 3 %
Basophils Absolute: 0.1 10*3/uL (ref 0.0–0.1)
Basophils Relative: 1 %
Eosinophils Absolute: 0.3 10*3/uL (ref 0.0–0.5)
Eosinophils Relative: 3 %
HCT: 37.3 % — ABNORMAL LOW (ref 39.0–52.0)
Hemoglobin: 12.6 g/dL — ABNORMAL LOW (ref 13.0–17.0)
Lymphocytes Relative: 14 %
Lymphs Abs: 1.4 10*3/uL (ref 0.7–4.0)
MCH: 32.9 pg (ref 26.0–34.0)
MCHC: 33.8 g/dL (ref 30.0–36.0)
MCV: 97.4 fL (ref 80.0–100.0)
Metamyelocytes Relative: 3 %
Monocytes Absolute: 0.3 10*3/uL (ref 0.1–1.0)
Monocytes Relative: 3 %
Myelocytes: 2 %
Neutro Abs: 7.6 10*3/uL (ref 1.7–7.7)
Neutrophils Relative %: 71 %
Platelet Count: 91 10*3/uL — ABNORMAL LOW (ref 150–400)
RBC: 3.83 MIL/uL — ABNORMAL LOW (ref 4.22–5.81)
RDW: 16.6 % — ABNORMAL HIGH (ref 11.5–15.5)
WBC Count: 10.3 10*3/uL (ref 4.0–10.5)
nRBC: 0.6 % — ABNORMAL HIGH (ref 0.0–0.2)

## 2022-08-04 LAB — PROTEIN, URINE, 24 HOUR
Collection Interval-UPROT: 24 hours
Protein, 24H Urine: 765 mg/d — ABNORMAL HIGH (ref 50–100)
Protein, Urine: 45 mg/dL
Urine Total Volume-UPROT: 1700 mL

## 2022-08-04 LAB — CMP (CANCER CENTER ONLY)
ALT: 17 U/L (ref 0–44)
AST: 16 U/L (ref 15–41)
Albumin: 3.7 g/dL (ref 3.5–5.0)
Alkaline Phosphatase: 145 U/L — ABNORMAL HIGH (ref 38–126)
Anion gap: 10 (ref 5–15)
BUN: 15 mg/dL (ref 6–20)
CO2: 23 mmol/L (ref 22–32)
Calcium: 8.8 mg/dL — ABNORMAL LOW (ref 8.9–10.3)
Chloride: 103 mmol/L (ref 98–111)
Creatinine: 1.05 mg/dL (ref 0.61–1.24)
GFR, Estimated: >60 mL/min (ref >60)
Glucose, Bld: 199 mg/dL — ABNORMAL HIGH (ref 70–99)
Potassium: 4.2 mmol/L (ref 3.5–5.1)
Sodium: 136 mmol/L (ref 135–145)
Total Bilirubin: 0.8 mg/dL (ref 0.3–1.2)
Total Protein: 6.8 g/dL (ref 6.5–8.1)

## 2022-08-04 LAB — CEA (ACCESS): CEA (CHCC): 2.38 ng/mL (ref 0.00–5.00)

## 2022-08-04 LAB — TOTAL PROTEIN, URINE DIPSTICK: Protein, ur: 30 mg/dL — AB

## 2022-08-04 MED ORDER — SODIUM CHLORIDE 0.9 % IV SOLN
2400.0000 mg/m2 | INTRAVENOUS | Status: DC
  Administered 2022-08-04: 5000 mg via INTRAVENOUS
  Filled 2022-08-04: qty 100

## 2022-08-04 MED ORDER — PALONOSETRON HCL INJECTION 0.25 MG/5ML
0.2500 mg | Freq: Once | INTRAVENOUS | Status: AC
  Administered 2022-08-04: 0.25 mg via INTRAVENOUS
  Filled 2022-08-04: qty 5

## 2022-08-04 MED ORDER — FLUOROURACIL CHEMO INJECTION 2.5 GM/50ML
400.0000 mg/m2 | Freq: Once | INTRAVENOUS | Status: AC
  Administered 2022-08-04: 900 mg via INTRAVENOUS
  Filled 2022-08-04: qty 18

## 2022-08-04 MED ORDER — SODIUM CHLORIDE 0.9 % IV SOLN
400.0000 mg/m2 | Freq: Once | INTRAVENOUS | Status: AC
  Administered 2022-08-04: 892 mg via INTRAVENOUS
  Filled 2022-08-04: qty 44.6

## 2022-08-04 MED ORDER — SODIUM CHLORIDE 0.9 % IV SOLN: Freq: Once | INTRAVENOUS | Status: AC

## 2022-08-04 MED ORDER — ATROPINE SULFATE 1 MG/ML IV SOLN
0.5000 mg | Freq: Once | INTRAVENOUS | Status: AC | PRN
  Administered 2022-08-04: 0.5 mg via INTRAVENOUS
  Filled 2022-08-04: qty 1

## 2022-08-04 MED ORDER — SODIUM CHLORIDE 0.9 % IV SOLN
140.0000 mg/m2 | Freq: Once | INTRAVENOUS | Status: AC
  Administered 2022-08-04: 300 mg via INTRAVENOUS
  Filled 2022-08-04: qty 15

## 2022-08-04 MED ORDER — SODIUM CHLORIDE 0.9 % IV SOLN
10.0000 mg | Freq: Once | INTRAVENOUS | Status: AC
  Administered 2022-08-04: 10 mg via INTRAVENOUS
  Filled 2022-08-04: qty 10

## 2022-08-04 NOTE — Patient Instructions (Addendum)
[facility]   The chemotherapy medication bag should finish at 46 hours, 96 hours, or 7 days. For example, if your pump is scheduled for 46 hours and it was put on at 4:00 p.m., it should finish at 2:00 p.m. the day it is scheduled to come off regardless of your appointment time.     Estimated time to finish at 12:15 pm Wednesday Aug 06, 2022.   If the display on your pump reads "Low Volume" and it is beeping, take the batteries out of the pump and come to the cancer center for it to be taken off.   If the pump alarms go off prior to the pump reading "Low Volume" then call [phone] and someone can assist you.  If the plunger comes out and the chemotherapy medication is leaking out, please use your home chemo spill kit to clean up the spill. Do NOT use paper towels or other household products.  If you have problems or questions regarding your pump, please call either [phone] (24 hours a day) or the cancer center Monday-Friday 8:00 a.m.- 4:30 p.m. at the clinic number and we will assist you. If you are unable to get assistance, then go to the nearest Emergency Department and ask the staff to contact the IV team for assistance.  Discharge Instructions: Thank you for choosing [facility] to provide your oncology and hematology care.   If you have a lab appointment with the Cancer Center, please go directly to the Cancer Center and check in at the registration area.   Wear comfortable clothing and clothing appropriate for easy access to any Portacath or PICC line.   We strive to give you quality time with your provider. You may need to reschedule your appointment if you arrive late (15 or more minutes).  Arriving late affects you and other patients whose appointments are after yours.  Also, if you miss three or more appointments without notifying the office, you may be dismissed from the clinic at the provider's discretion.      For  prescription refill requests, have your pharmacy contact our office and allow 72 hours for refills to be completed.    Today you received the following chemotherapy and/or immunotherapy agents Irinotecan, Leucovorin, Fluorouracil.       To help prevent nausea and vomiting after your treatment, we encourage you to take your nausea medication as directed.  BELOW ARE SYMPTOMS THAT SHOULD BE REPORTED IMMEDIATELY: *FEVER GREATER THAN 100.4 F (38 C) OR HIGHER *CHILLS OR SWEATING *NAUSEA AND VOMITING THAT IS NOT CONTROLLED WITH YOUR NAUSEA MEDICATION *UNUSUAL SHORTNESS OF BREATH *UNUSUAL BRUISING OR BLEEDING *URINARY PROBLEMS (pain or burning when urinating, or frequent urination) *BOWEL PROBLEMS (unusual diarrhea, constipation, pain near the anus) TENDERNESS IN MOUTH AND THROAT WITH OR WITHOUT PRESENCE OF ULCERS (sore throat, sores in mouth, or a toothache) UNUSUAL RASH, SWELLING OR PAIN  UNUSUAL VAGINAL DISCHARGE OR ITCHING   Items with * indicate a potential emergency and should be followed up as soon as possible or go to the Emergency Department if any problems should occur.  Please show the CHEMOTHERAPY ALERT CARD or IMMUNOTHERAPY ALERT CARD at check-in to the Emergency Department and triage nurse.  Should you have questions after your visit or need to cancel or reschedule your appointment, please contact [facility]  Dept: [phone]  and follow the prompts.  Office hours are 8:00 a.m. to 4:30 p.m. Monday - Friday.  Please note that voicemails left after 4:00 p.m. may not be returned until the following business day.  We are closed weekends and major holidays. You have access to a nurse at all times for urgent questions. Please call the main number to the clinic Dept: [phone] and follow the prompts.   For any non-urgent questions, you may also contact your provider using MyChart. We now offer e-Visits for anyone 36 and older to request care online  for non-urgent symptoms. For details visit [email or web address].   Also download the MyChart app! Go to the app store, search "MyChart", open the app, select [facility], and log in with your MyChart username and password.  Irinotecan Injection What is this medication? IRINOTECAN (ir in oh TEE kan) treats some types of cancer. It works by slowing down the growth of cancer cells. This medicine may be used for other purposes; ask your health care provider or pharmacist if you have questions. COMMON BRAND NAME(S): Camptosar What should I tell my care team before I take this medication? They need to know if you have any of these conditions: Dehydration Diarrhea Infection, especially a viral infection, such as chickenpox, cold sores, herpes Liver disease Low blood cell levels (white cells, red cells, and platelets) Low levels of electrolytes, such as calcium, magnesium, or potassium in your blood Recent or ongoing radiation An unusual or allergic reaction to irinotecan, other medications, foods, dyes, or preservatives If you or your partner are pregnant or trying to get pregnant Breast-feeding How should I use this medication? This medication is injected into a vein. It is given by your care team in a hospital or clinic setting. Talk to your care team about the use of this medication in children. Special care may be needed. Overdosage: If you think you have taken too much of this medicine contact a poison control center or emergency room at once. NOTE: This medicine is only for you. Do not share this medicine with others. What if I miss a dose? Keep appointments for follow-up doses. It is important not to miss your dose. Call your care team if you are unable to keep an appointment. What may interact with this medication? Do not take this medication with any of the following: Cobicistat Itraconazole This medication may also interact with the following: Certain antibiotics, such as  clarithromycin, rifampin, rifabutin Certain antivirals for HIV or AIDS Certain medications for fungal infections, such as ketoconazole, posaconazole, voriconazole Certain medications for seizures, such as carbamazepine, phenobarbital, phenytoin Gemfibrozil Nefazodone St. John's wort This list may not describe all possible interactions. Give your health care provider a list of all the medicines, herbs, non-prescription drugs, or dietary supplements you use. Also tell them if you smoke, drink alcohol, or use illegal drugs. Some items may interact with your medicine. What should I watch for while using this medication? Your condition will be monitored carefully while you are receiving this medication. You may need blood work while taking this medication. This medication may make you feel generally unwell. This is not uncommon as chemotherapy can affect healthy cells as well as cancer cells. Report any side effects. Continue your course of treatment even though you feel ill unless your care team tells you to stop. This medication can cause serious side effects. To reduce the risk, your care team may give you other medications to take before receiving this one. Be sure to follow the directions from your care team. This medication may affect your coordination, reaction time, or judgement. Do not  drive or operate machinery until you know how this medication affects you. Sit up or stand slowly to reduce the risk of dizzy or fainting spells. Drinking alcohol with this medication can increase the risk of these side effects. This medication may increase your risk of getting an infection. Call your care team for advice if you get a fever, chills, sore throat, or other symptoms of a cold or flu. Do not treat yourself. Try to avoid being around people who are sick. Avoid taking medications that contain aspirin, acetaminophen, ibuprofen, naproxen, or ketoprofen unless instructed by your care team. These medications  may hide a fever. This medication may increase your risk to bruise or bleed. Call your care team if you notice any unusual bleeding. Be careful brushing or flossing your teeth or using a toothpick because you may get an infection or bleed more easily. If you have any dental work done, tell your dentist you are receiving this medication. Talk to your care team if you or your partner are pregnant or think either of you might be pregnant. This medication can cause serious birth defects if taken during pregnancy and for 6 months after the last dose. You will need a negative pregnancy test before starting this medication. Contraception is recommended while taking this medication and for 6 months after the last dose. Your care team can help you find the option that works for you. Do not father a child while taking this medication and for 3 months after the last dose. Use a condom for contraception during this time period. Do not breastfeed while taking this medication and for 7 days after the last dose. This medication may cause infertility. Talk to your care team if you are concerned about your fertility. What side effects may I notice from receiving this medication? Side effects that you should report to your care team as soon as possible: Allergic reactions--skin rash, itching, hives, swelling of the face, lips, tongue, or throat Dry cough, shortness of breath or trouble breathing Increased saliva or tears, increased sweating, stomach cramping, diarrhea, small pupils, unusual weakness or fatigue, slow heartbeat Infection--fever, chills, cough, sore throat, wounds that don't heal, pain or trouble when passing urine, general feeling of discomfort or being unwell Kidney injury--decrease in the amount of urine, swelling of the ankles, hands, or feet Low red blood cell level--unusual weakness or fatigue, dizziness, headache, trouble breathing Severe or prolonged diarrhea Unusual bruising or bleeding Side  effects that usually do not require medical attention (report to your care team if they continue or are bothersome): Constipation Diarrhea Hair loss Loss of appetite Nausea Stomach pain This list may not describe all possible side effects. Call your doctor for medical advice about side effects. You may report side effects to FDA at 1-800-FDA-1088. Where should I keep my medication? This medication is given in a hospital or clinic. It will not be stored at home. NOTE: This sheet is a summary. It may not cover all possible information. If you have questions about this medicine, talk to your doctor, pharmacist, or health care provider.  2023 Elsevier/Gold Standard (2021-07-25 00:00:00)  Leucovorin Injection What is this medication? LEUCOVORIN (loo koe VOR in) prevents side effects from certain medications, such as methotrexate. It works by increasing folate levels. This helps protect healthy cells in your body. It may also be used to treat anemia caused by low levels of folate. It can also be used with fluorouracil, a type of chemotherapy, to treat colorectal cancer. It works by  increasing the effects of fluorouracil in the body. This medicine may be used for other purposes; ask your health care provider or pharmacist if you have questions. What should I tell my care team before I take this medication? They need to know if you have any of these conditions: Anemia from low levels of vitamin B12 in the blood An unusual or allergic reaction to leucovorin, folic acid, other medications, foods, dyes, or preservatives Pregnant or trying to get pregnant Breastfeeding How should I use this medication? This medication is injected into a vein or a muscle. It is given by your care team in a hospital or clinic setting. Talk to your care team about the use of this medication in children. Special care may be needed. Overdosage: If you think you have taken too much of this medicine contact a poison control  center or emergency room at once. NOTE: This medicine is only for you. Do not share this medicine with others. What if I miss a dose? Keep appointments for follow-up doses. It is important not to miss your dose. Call your care team if you are unable to keep an appointment. What may interact with this medication? Capecitabine Fluorouracil Phenobarbital Phenytoin Primidone Trimethoprim;sulfamethoxazole This list may not describe all possible interactions. Give your health care provider a list of all the medicines, herbs, non-prescription drugs, or dietary supplements you use. Also tell them if you smoke, drink alcohol, or use illegal drugs. Some items may interact with your medicine. What should I watch for while using this medication? Your condition will be monitored carefully while you are receiving this medication. This medication may increase the side effects of 5-fluorouracil. Tell your care team if you have diarrhea or mouth sores that do not get better or that get worse. What side effects may I notice from receiving this medication? Side effects that you should report to your care team as soon as possible: Allergic reactions--skin rash, itching, hives, swelling of the face, lips, tongue, or throat This list may not describe all possible side effects. Call your doctor for medical advice about side effects. You may report side effects to FDA at 1-800-FDA-1088. Where should I keep my medication? This medication is given in a hospital or clinic. It will not be stored at home. NOTE: This sheet is a summary. It may not cover all possible information. If you have questions about this medicine, talk to your doctor, pharmacist, or health care provider.  2023 Elsevier/Gold Standard (2021-07-26 00:00:00)  Fluorouracil Injection What is this medication? FLUOROURACIL (flure oh YOOR a sil) treats some types of cancer. It works by slowing down the growth of cancer cells. This medicine may be used  for other purposes; ask your health care provider or pharmacist if you have questions. COMMON BRAND NAME(S): Adrucil What should I tell my care team before I take this medication? They need to know if you have any of these conditions: Blood disorders Dihydropyrimidine dehydrogenase (DPD) deficiency Infection, such as chickenpox, cold sores, herpes Kidney disease Liver disease Poor nutrition Recent or ongoing radiation therapy An unusual or allergic reaction to fluorouracil, other medications, foods, dyes, or preservatives If you or your partner are pregnant or trying to get pregnant Breast-feeding How should I use this medication? This medication is injected into a vein. It is administered by your care team in a hospital or clinic setting. Talk to your care team about the use of this medication in children. Special care may be needed. Overdosage: If you think you  have taken too much of this medicine contact a poison control center or emergency room at once. NOTE: This medicine is only for you. Do not share this medicine with others. What if I miss a dose? Keep appointments for follow-up doses. It is important not to miss your dose. Call your care team if you are unable to keep an appointment. What may interact with this medication? Do not take this medication with any of the following: Live virus vaccines This medication may also interact with the following: Medications that treat or prevent blood clots, such as warfarin, enoxaparin, dalteparin This list may not describe all possible interactions. Give your health care provider a list of all the medicines, herbs, non-prescription drugs, or dietary supplements you use. Also tell them if you smoke, drink alcohol, or use illegal drugs. Some items may interact with your medicine. What should I watch for while using this medication? Your condition will be monitored carefully while you are receiving this medication. This medication may make  you feel generally unwell. This is not uncommon as chemotherapy can affect healthy cells as well as cancer cells. Report any side effects. Continue your course of treatment even though you feel ill unless your care team tells you to stop. In some cases, you may be given additional medications to help with side effects. Follow all directions for their use. This medication may increase your risk of getting an infection. Call your care team for advice if you get a fever, chills, sore throat, or other symptoms of a cold or flu. Do not treat yourself. Try to avoid being around people who are sick. This medication may increase your risk to bruise or bleed. Call your care team if you notice any unusual bleeding. Be careful brushing or flossing your teeth or using a toothpick because you may get an infection or bleed more easily. If you have any dental work done, tell your dentist you are receiving this medication. Avoid taking medications that contain aspirin, acetaminophen, ibuprofen, naproxen, or ketoprofen unless instructed by your care team. These medications may hide a fever. Do not treat diarrhea with over the counter products. Contact your care team if you have diarrhea that lasts more than 2 days or if it is severe and watery. This medication can make you more sensitive to the sun. Keep out of the sun. If you cannot avoid being in the sun, wear protective clothing and sunscreen. Do not use sun lamps, tanning beds, or tanning booths. Talk to your care team if you or your partner wish to become pregnant or think you might be pregnant. This medication can cause serious birth defects if taken during pregnancy and for 3 months after the last dose. A reliable form of contraception is recommended while taking this medication and for 3 months after the last dose. Talk to your care team about effective forms of contraception. Do not father a child while taking this medication and for 3 months after the last dose.  Use a condom while having sex during this time period. Do not breastfeed while taking this medication. This medication may cause infertility. Talk to your care team if you are concerned about your fertility. What side effects may I notice from receiving this medication? Side effects that you should report to your care team as soon as possible: Allergic reactions--skin rash, itching, hives, swelling of the face, lips, tongue, or throat Heart attack--pain or tightness in the chest, shoulders, arms, or jaw, nausea, shortness of breath, cold or  clammy skin, feeling faint or lightheaded Heart failure--shortness of breath, swelling of the ankles, feet, or hands, sudden weight gain, unusual weakness or fatigue Heart rhythm changes--fast or irregular heartbeat, dizziness, feeling faint or lightheaded, chest pain, trouble breathing High ammonia level--unusual weakness or fatigue, confusion, loss of appetite, nausea, vomiting, seizures Infection--fever, chills, cough, sore throat, wounds that don't heal, pain or trouble when passing urine, general feeling of discomfort or being unwell Low red blood cell level--unusual weakness or fatigue, dizziness, headache, trouble breathing Pain, tingling, or numbness in the hands or feet, muscle weakness, change in vision, confusion or trouble speaking, loss of balance or coordination, trouble walking, seizures Redness, swelling, and blistering of the skin over hands and feet Severe or prolonged diarrhea Unusual bruising or bleeding Side effects that usually do not require medical attention (report to your care team if they continue or are bothersome): Dry skin Headache Increased tears Nausea Pain, redness, or swelling with sores inside the mouth or throat Sensitivity to light Vomiting This list may not describe all possible side effects. Call your doctor for medical advice about side effects. You may report side effects to FDA at 1-800-FDA-1088. Where should I  keep my medication? This medication is given in a hospital or clinic. It will not be stored at home. NOTE: This sheet is a summary. It may not cover all possible information. If you have questions about this medicine, talk to your doctor, pharmacist, or health care provider.  2023 Elsevier/Gold Standard (2021-07-16 00:00:00)

## 2022-08-04 NOTE — Progress Notes (Signed)
[facility] OFFICE PROGRESS NOTE   Diagnosis: Colon cancer  INTERVAL HISTORY:   Mr. Alberti returns as scheduled.  He completed cycle 5 FOLFIRI 07/22/2022.  Avastin held due to proteinuria.  He had mild nausea, relieved with home medications.  No mouth sores.  Mild sore throat.  Intermittent cough.  No diarrhea.  Some fatigue.  Objective:  Vital signs in last 24 hours:  Blood pressure (!) 146/83, pulse 85, temperature 98.2 F (36.8 C), temperature source Oral, resp. rate 18, height 6' (1.829 m), weight 218 lb 11.2 oz (99.2 kg), SpO2 99 %.    HEENT: No thrush or ulcers. Resp: Lungs clear bilaterally. Cardio: Regular rate and rhythm. GI: No hepatomegaly. Vascular: Left lower leg is slightly larger than the right lower leg. Skin: Palms without erythema. Port-A-Cath without erythema.  Lab Results:  Lab Results  Component Value Date   WBC 10.3 08/04/2022   HGB 12.6 (L) 08/04/2022   HCT 37.3 (L) 08/04/2022   MCV 97.4 08/04/2022   PLT 91 (L) 08/04/2022   NEUTROABS 7.6 08/04/2022    Imaging:  No results found.  Medications: I have reviewed the patient's current medications.  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, status post a left colectomy 03/04/2019 Mass felt to be in the transverse colon on a colonoscopy 01/07/2019 with a biopsy confirming poorly differentiated adenocarcinoma with signet ring cell features, mismatch repair protein expression normal CT abdomen/pelvis 01/08/2019 12.3 cm indeterminate splenic mass, present in 2015 on a lumbar MRI-felt to be benign, small subpleural and pleural-based nodules at the lung bases, status post left nephrectomy CT chest 01/20/2019-small bilateral pulmonary nodules with rounded parenchymal nodules in the right lower lobe, indeterminate splenic mass CT chest 03/18/2019-multiple subcentimeter pulmonary  nodules again seen bilaterally, greatest in the lower lobes, without significant change.  No new or enlarging pulmonary nodules or masses.  Partially visualized large heterogeneous splenic mass with no significant change.  Plan for follow-up chest CT at a 72-month interval. Cycle 1 FOLFOX 03/21/2019 Cycle 2 FOLFOX 04/04/2019 Cycle 3 FOLFOX 04/18/2019 (oxaliplatin dose reduced secondary to thrombocytopenia) Cycle 4 FOLFOX 05/02/2019 (oxaliplatin held secondary to thrombocytopenia) Cycle 5 FOLFOX 05/16/2019 Cycle 6 FOLFOX 05/30/2019 (oxaliplatin held secondary to thrombocytopenia) CT chest 06/08/2019-stable scattered small solid pulmonary nodules  Cycle 7 FOLFOX 06/13/2019 Cycle 8 FOLFOX 06/27/2019 Cycle 9 FOLFOX 07/11/2019 Cycle 10 FOLFOX 07/25/2019 (oxaliplatin held due to thrombocytopenia) Cycle 11 FOLFOX 08/08/2019 Cycle 12 FOLFOX 08/22/2019 CTs 01/09/2020-no evidence of recurrent disease, stable lung nodules favored to represent subpleural lymph nodes-dedicated follow-up not recommended CTs 01/11/2021-no evidence of recurrent disease CTs 01/24/2022-no evidence of recurrent disease CT lumbar spine, abdomen/pelvis 04/11/2022-numerous mixed lytic and sclerotic lesions scattered throughout the visualized axial and appendicular skeleton including several areas in the lumbar spine.   04/21/2022 CT biopsy sclerotic lesion right anterior iliac bone-consistent with metastatic colorectal adenocarcinoma, moderate to poorly differentiated.  Foundation 1-microsatellite stable, tumor mutation burden 4, K-ras G12S PET scan 05/09/2022-widespread hypermetabolic mixed faintly lytic and sclerotic osseous metastases throughout the axial and proximal appendicular skeleton; associated mild pathologic compression fracture at L3; no hypermetabolic extraosseous metastatic disease. Cycle 1 FOLFIRI/bevacizumab 05/26/2022 Cycle 2 FOLFIRI/bevacizumab 06/09/2022 Cycle 3 FOLFIRI 06/24/2022, bevacizumab 06/26/2022 Cycle 4 FOLFIRI 07/08/2022,  bevacizumab held due to proteinuria Cycle 5 FOLFIRI 07/22/2022, bevacizumab held due to proteinuria CTs 07/31/2022-potentially areas of sclerosis represent interval healing; new lytic lesion at T12, multiple new areas of punched-out sclerosis throughout the visualized osseous  structures.  Index lesion in the left iliac wing measures 1.3 x 2.8 cm.  Healing of previously metabolically occult metastatic disease on 05/09/2022 is a possibility. 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  Left nephrectomy at age 47 12.  Port-A-Cath placement, Dr. Michaell Cowing, 03/16/2019 13.  Thrombocytopenia secondary to chemotherapy-oxaliplatin dose reduced beginning with cycle 3 FOLFOX, improved 14.  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  Right groin soft tissue infection May 2023-status postsurgical debridement 18.  Left leg DVT-common femoral, femoral, popliteal, posterior tibial, and peroneal veins 19.  02/04/2022-MRI cervical spine-1.7 cm T1 lesion-indeterminate; bone scan 03/14/2022-focal intense activity at approximate T1 level felt to correspond to the lesion on MRI, additional foci of activity involving the lower thoracic and lumbar spine, right iliac bone and  pubic symphysis.  CT lumbar spine, abdomen/pelvis 04/11/2022-numerous mixed lytic and sclerotic lesions scattered throughout the visualized axial and appendicular skeleton including several areas in the lumbar spine.  04/21/2022 CT biopsy sclerotic lesion right anterior iliac bone-consistent with metastatic colorectal adenocarcinoma, moderate to poorly differentiated  Disposition: Mr. Hashimi appears stable.  He has completed 5 cycles of FOLFIRI.   Bevacizumab held with cycles 4 and 5 due to proteinuria.  24-hour urine result is pending.  Pain is better and the CEA has normalized.  CT findings potentially represent healing of bone lesions, though a new lytic lesion is noted at T12.  Report/images reviewed with Mr. Vanson.  [name] recommends continuation of FOLFIRI for now with follow-up imaging after 4 more cycles.  Mr. Pirkle agrees with this plan.  Plan to proceed with cycle 6 FOLFIRI today as scheduled.  Bevacizumab will be held until the 24-hour urine result is available.  Irinotecan dose reduced due to thrombocytopenia.  He will return for lab, follow-up, chemotherapy in 2 weeks.  Patient seen with [name].    Lonna Cobb ANP/GNP-BC   08/04/2022  9:57 AM  This was a shared visit with Lonna Cobb.  Mr. Nigg continues to tolerate chemotherapy well.  His clinical status has improved since beginning FOLFIRI.  Pain has resolved.  The CEA is now normal.  We reviewed the restaging CT findings and images with Mr. Kollman.  I reviewed images with a radiologist.  Several of the bone lesions appear sclerotic compared to the CT PET in February.  There are several lesions which appear to be new.  We discussed the implications of the CT findings with Mr. Cunnane.  The bone changes may represent a mixed response to FOLFIRI/Avastin or bone lesions may now be more visible due to healing.  We decided to continue FOLFIRI with the plan to obtain a restaging CT in 2 months.  Bevacizumab will remain on hold while  we wait on results of the 24-hour urine protein submitted today.  I was present for greater than 50% of today's visit.  I performed medical decision making.  Mancel Bale, MD

## 2022-08-04 NOTE — Progress Notes (Signed)
Patient seen by Lonna Cobb NP today  Vitals are within treatment parameters.  Labs reviewed by Lonna Cobb NP and are not all within treatment parameters. Plts 91 and protein 30  Per physician team, patient is ready for treatment. Please note that modifications are being made to the treatment plan including no avastin and irinotecan dose reduced.

## 2022-08-04 NOTE — Progress Notes (Signed)
Per Roxan Diesel, NP please release premedications for treatment. Pt will not receive Avastin. Dr Truett Perna is waiting for the final results from patient's CT scan from 07/31/2022 to make final decision on treatment.

## 2022-08-06 ENCOUNTER — Inpatient Hospital Stay: Payer: BC Managed Care – PPO

## 2022-08-06 VITALS — BP 136/86 | HR 98 | Temp 98.3°F | Resp 18

## 2022-08-06 DIAGNOSIS — C186 Malignant neoplasm of descending colon: Secondary | ICD-10-CM | POA: Diagnosis not present

## 2022-08-06 DIAGNOSIS — Z5111 Encounter for antineoplastic chemotherapy: Secondary | ICD-10-CM | POA: Diagnosis not present

## 2022-08-06 DIAGNOSIS — Z5189 Encounter for other specified aftercare: Secondary | ICD-10-CM | POA: Diagnosis not present

## 2022-08-06 DIAGNOSIS — C7951 Secondary malignant neoplasm of bone: Secondary | ICD-10-CM | POA: Diagnosis not present

## 2022-08-06 MED ORDER — HEPARIN SOD (PORK) LOCK FLUSH 100 UNIT/ML IV SOLN
500.0000 [IU] | Freq: Once | INTRAVENOUS | Status: AC | PRN
  Administered 2022-08-06: 500 [IU]

## 2022-08-06 MED ORDER — PEGFILGRASTIM-CBQV 6 MG/0.6ML ~~LOC~~ SOSY
6.0000 mg | PREFILLED_SYRINGE | Freq: Once | SUBCUTANEOUS | Status: AC
  Administered 2022-08-06: 6 mg via SUBCUTANEOUS
  Filled 2022-08-06: qty 0.6

## 2022-08-06 MED ORDER — SODIUM CHLORIDE 0.9% FLUSH
10.0000 mL | INTRAVENOUS | Status: DC | PRN
  Administered 2022-08-06: 10 mL

## 2022-08-06 NOTE — Patient Instructions (Signed)

## 2022-08-11 ENCOUNTER — Other Ambulatory Visit: Payer: Self-pay | Admitting: Oncology

## 2022-08-15 LAB — GUARDANT 360

## 2022-08-18 ENCOUNTER — Inpatient Hospital Stay: Payer: BC Managed Care – PPO

## 2022-08-18 ENCOUNTER — Inpatient Hospital Stay: Payer: BC Managed Care – PPO | Admitting: Oncology

## 2022-08-18 VITALS — BP 136/80 | HR 89 | Temp 98.2°F | Resp 18 | Ht 72.0 in | Wt 218.4 lb

## 2022-08-18 VITALS — BP 141/87 | HR 82 | Resp 20

## 2022-08-18 DIAGNOSIS — C186 Malignant neoplasm of descending colon: Secondary | ICD-10-CM

## 2022-08-18 DIAGNOSIS — Z5189 Encounter for other specified aftercare: Secondary | ICD-10-CM | POA: Diagnosis not present

## 2022-08-18 DIAGNOSIS — C7951 Secondary malignant neoplasm of bone: Secondary | ICD-10-CM | POA: Diagnosis not present

## 2022-08-18 DIAGNOSIS — Z5111 Encounter for antineoplastic chemotherapy: Secondary | ICD-10-CM | POA: Diagnosis not present

## 2022-08-18 LAB — CBC WITH DIFFERENTIAL (CANCER CENTER ONLY)
Abs Immature Granulocytes: 0.5 10*3/uL — ABNORMAL HIGH (ref 0.00–0.07)
Band Neutrophils: 5 %
Basophils Absolute: 0 10*3/uL (ref 0.0–0.1)
Basophils Relative: 0 %
Eosinophils Absolute: 0.2 10*3/uL (ref 0.0–0.5)
Eosinophils Relative: 2 %
HCT: 35.7 % — ABNORMAL LOW (ref 39.0–52.0)
Hemoglobin: 12.1 g/dL — ABNORMAL LOW (ref 13.0–17.0)
Lymphocytes Relative: 13 %
Lymphs Abs: 1.5 10*3/uL (ref 0.7–4.0)
MCH: 34 pg (ref 26.0–34.0)
MCHC: 33.9 g/dL (ref 30.0–36.0)
MCV: 100.3 fL — ABNORMAL HIGH (ref 80.0–100.0)
Metamyelocytes Relative: 1 %
Monocytes Absolute: 0.4 10*3/uL (ref 0.1–1.0)
Monocytes Relative: 3 %
Myelocytes: 3 %
Neutro Abs: 9.3 10*3/uL — ABNORMAL HIGH (ref 1.7–7.7)
Neutrophils Relative %: 73 %
Platelet Count: 123 10*3/uL — ABNORMAL LOW (ref 150–400)
RBC: 3.56 MIL/uL — ABNORMAL LOW (ref 4.22–5.81)
RDW: 16.8 % — ABNORMAL HIGH (ref 11.5–15.5)
WBC Count: 11.9 10*3/uL — ABNORMAL HIGH (ref 4.0–10.5)
nRBC: 0.3 % — ABNORMAL HIGH (ref 0.0–0.2)

## 2022-08-18 LAB — CMP (CANCER CENTER ONLY)
ALT: 16 U/L (ref 0–44)
AST: 16 U/L (ref 15–41)
Albumin: 3.6 g/dL (ref 3.5–5.0)
Alkaline Phosphatase: 97 U/L (ref 38–126)
Anion gap: 9 (ref 5–15)
BUN: 20 mg/dL (ref 6–20)
CO2: 24 mmol/L (ref 22–32)
Calcium: 8.3 mg/dL — ABNORMAL LOW (ref 8.9–10.3)
Chloride: 102 mmol/L (ref 98–111)
Creatinine: 1.02 mg/dL (ref 0.61–1.24)
GFR, Estimated: >60 mL/min (ref >60)
Glucose, Bld: 253 mg/dL — ABNORMAL HIGH (ref 70–99)
Potassium: 4.2 mmol/L (ref 3.5–5.1)
Sodium: 135 mmol/L (ref 135–145)
Total Bilirubin: 0.7 mg/dL (ref 0.3–1.2)
Total Protein: 6 g/dL — ABNORMAL LOW (ref 6.5–8.1)

## 2022-08-18 LAB — CEA (ACCESS): CEA (CHCC): 1.97 ng/mL (ref 0.00–5.00)

## 2022-08-18 LAB — TOTAL PROTEIN, URINE DIPSTICK: Protein, ur: 300 mg/dL — AB

## 2022-08-18 MED ORDER — FLUOROURACIL CHEMO INJECTION 2.5 GM/50ML
400.0000 mg/m2 | Freq: Once | INTRAVENOUS | Status: AC
  Administered 2022-08-18: 900 mg via INTRAVENOUS
  Filled 2022-08-18: qty 18

## 2022-08-18 MED ORDER — ATROPINE SULFATE 1 MG/ML IV SOLN
0.5000 mg | Freq: Once | INTRAVENOUS | Status: DC | PRN
  Filled 2022-08-18: qty 1

## 2022-08-18 MED ORDER — SODIUM CHLORIDE 0.9 % IV SOLN
400.0000 mg/m2 | Freq: Once | INTRAVENOUS | Status: AC
  Administered 2022-08-18: 892 mg via INTRAVENOUS
  Filled 2022-08-18: qty 44.6

## 2022-08-18 MED ORDER — SODIUM CHLORIDE 0.9 % IV SOLN
140.0000 mg/m2 | Freq: Once | INTRAVENOUS | Status: AC
  Administered 2022-08-18: 300 mg via INTRAVENOUS
  Filled 2022-08-18: qty 15

## 2022-08-18 MED ORDER — SODIUM CHLORIDE 0.9 % IV SOLN
2400.0000 mg/m2 | INTRAVENOUS | Status: DC
  Administered 2022-08-18: 5000 mg via INTRAVENOUS
  Filled 2022-08-18: qty 100

## 2022-08-18 MED ORDER — SODIUM CHLORIDE 0.9 % IV SOLN
10.0000 mg | Freq: Once | INTRAVENOUS | Status: AC
  Administered 2022-08-18: 10 mg via INTRAVENOUS
  Filled 2022-08-18: qty 10

## 2022-08-18 MED ORDER — PALONOSETRON HCL INJECTION 0.25 MG/5ML
0.2500 mg | Freq: Once | INTRAVENOUS | Status: AC
  Administered 2022-08-18: 0.25 mg via INTRAVENOUS
  Filled 2022-08-18: qty 5

## 2022-08-18 MED ORDER — DOXYCYCLINE HYCLATE 100 MG PO TABS: 100.0000 mg | ORAL_TABLET | Freq: Two times a day (BID) | ORAL | 0 refills | Status: DC

## 2022-08-18 MED ORDER — SODIUM CHLORIDE 0.9 % IV SOLN: Freq: Once | INTRAVENOUS | Status: AC

## 2022-08-18 NOTE — Progress Notes (Signed)
[facility] OFFICE PROGRESS NOTE   Diagnosis: Colon cancer  INTERVAL HISTORY:   Mr. Borden completed another cycle of FOLFIRI on 08/04/2022.  He reports soreness in the mouth when he eats salsa.  No discrete mouth sores.  Nausea is relieved with Compazine.  No vomiting.  No diarrhea.  He has developed a firm erythematous area in the suprapubic region for the past 5 days.  He has a history of inflamed cyst.  Objective:  Vital signs in last 24 hours:  Blood pressure 136/80, pulse 89, temperature 98.2 F (36.8 C), temperature source Oral, resp. rate 18, height 6' (1.829 m), weight 218 lb 6.4 oz (99.1 kg), SpO2 100 %.    HEENT: No thrush or ulcers Resp: Lungs clear bilaterally Cardio: The rate and rhythm GI: No hepatosplenomegaly, nontender, no mass Vascular: 1+ edema to left lower leg Skin: Firm erythematous 2-3 cm cutaneous lesion at the suprapubic region.  I was able to express serosanguineous material from the lesion.  Portacath/PICC-without erythema  Lab Results:  Lab Results  Component Value Date   WBC 11.9 (H) 08/18/2022   HGB 12.1 (L) 08/18/2022   HCT 35.7 (L) 08/18/2022   MCV 100.3 (H) 08/18/2022   PLT 123 (L) 08/18/2022   NEUTROABS 9.3 (H) 08/18/2022    CMP  Lab Results  Component Value Date   NA 135 08/18/2022   K 4.2 08/18/2022   CL 102 08/18/2022   CO2 24 08/18/2022   GLUCOSE 253 (H) 08/18/2022   BUN 20 08/18/2022   CREATININE 1.02 08/18/2022   CALCIUM 8.3 (L) 08/18/2022   PROT 6.0 (L) 08/18/2022   ALBUMIN 3.6 08/18/2022   AST 16 08/18/2022   ALT 16 08/18/2022   ALKPHOS 97 08/18/2022   BILITOT 0.7 08/18/2022   GFRNONAA >60 08/18/2022   GFRAA 115 02/08/2020    Lab Results  Component Value Date   CEA1 2.00 07/13/2020   CEA 2.38 08/04/2022     Medications: I have reviewed the patient's current medications.   Assessment/Plan: Adenocarcinoma of the descending colon, stage IIIC (Z6X0R), moderately differentiated adenocarcinoma  with mucinous and signet cell features, status post a left colectomy 03/04/2019 Mass felt to be in the transverse colon on a colonoscopy 01/07/2019 with a biopsy confirming poorly differentiated adenocarcinoma with signet ring cell features, mismatch repair protein expression normal CT abdomen/pelvis 01/08/2019 12.3 cm indeterminate splenic mass, present in 2015 on a lumbar MRI-felt to be benign, small subpleural and pleural-based nodules at the lung bases, status post left nephrectomy CT chest 01/20/2019-small bilateral pulmonary nodules with rounded parenchymal nodules in the right lower lobe, indeterminate splenic mass CT chest 03/18/2019-multiple subcentimeter pulmonary nodules again seen bilaterally, greatest in the lower lobes, without significant change.  No new or enlarging pulmonary nodules or masses.  Partially visualized large heterogeneous splenic mass with no significant change.  Plan for follow-up chest CT at a 57-month interval. Cycle 1 FOLFOX 03/21/2019 Cycle 2 FOLFOX 04/04/2019 Cycle 3 FOLFOX 04/18/2019 (oxaliplatin dose reduced secondary to thrombocytopenia) Cycle 4 FOLFOX 05/02/2019 (oxaliplatin held secondary to thrombocytopenia) Cycle 5 FOLFOX 05/16/2019 Cycle 6 FOLFOX 05/30/2019 (oxaliplatin held secondary to thrombocytopenia) CT chest 06/08/2019-stable scattered small solid pulmonary nodules  Cycle 7 FOLFOX 06/13/2019 Cycle 8 FOLFOX 06/27/2019 Cycle 9 FOLFOX 07/11/2019 Cycle 10 FOLFOX 07/25/2019 (oxaliplatin held due to thrombocytopenia) Cycle 11 FOLFOX 08/08/2019 Cycle 12 FOLFOX 08/22/2019 CTs 01/09/2020-no evidence of recurrent disease, stable lung nodules favored to represent subpleural lymph nodes-dedicated follow-up not recommended CTs 01/11/2021-no evidence of recurrent disease CTs  01/24/2022-no evidence of recurrent disease CT lumbar spine, abdomen/pelvis 04/11/2022-numerous mixed lytic and sclerotic lesions scattered throughout the visualized axial and appendicular skeleton including  several areas in the lumbar spine.   04/21/2022 CT biopsy sclerotic lesion right anterior iliac bone-consistent with metastatic colorectal adenocarcinoma, moderate to poorly differentiated.  Foundation 1-microsatellite stable, tumor mutation burden 4, K-ras G12S PET scan 05/09/2022-widespread hypermetabolic mixed faintly lytic and sclerotic osseous metastases throughout the axial and proximal appendicular skeleton; associated mild pathologic compression fracture at L3; no hypermetabolic extraosseous metastatic disease. Cycle 1 FOLFIRI/bevacizumab 05/26/2022 Cycle 2 FOLFIRI/bevacizumab 06/09/2022 Cycle 3 FOLFIRI 06/24/2022, bevacizumab 06/26/2022 Cycle 4 FOLFIRI 07/08/2022, bevacizumab held due to proteinuria Cycle 5 FOLFIRI 07/22/2022, bevacizumab held due to proteinuria CTs 07/31/2022-potentially areas of sclerosis represent interval healing; new lytic lesion at T12, multiple new areas of punched-out sclerosis throughout the visualized osseous structures.  Index lesion in the left iliac wing measures 1.3 x 2.8 cm.  Healing of previously metabolically occult metastatic disease on 05/09/2022 is a possibility. 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  Left nephrectomy at age 56 12.  Port-A-Cath placement, Dr. Michaell Cowing, 03/16/2019 13.  Thrombocytopenia  secondary to chemotherapy-oxaliplatin dose reduced beginning with cycle 3 FOLFOX, improved 14.  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  Right groin soft tissue infection May 2023-status postsurgical debridement 18.  Left leg DVT-common femoral, femoral, popliteal, posterior tibial, and peroneal veins 19.  02/04/2022-MRI cervical spine-1.7 cm T1 lesion-indeterminate; bone scan 03/14/2022-focal intense activity at approximate T1 level felt to correspond to the lesion on MRI, additional foci of activity involving the lower thoracic and lumbar spine, right iliac bone and pubic symphysis.  CT lumbar spine, abdomen/pelvis 04/11/2022-numerous mixed lytic and sclerotic lesions scattered throughout the visualized axial and appendicular skeleton including several areas in the lumbar spine.  04/21/2022 CT biopsy sclerotic lesion right anterior iliac bone-consistent with metastatic colorectal adenocarcinoma, moderate to poorly differentiated 20.  Inflamed sebaceous cyst 08/18/2022-doxycycline    Disposition: Mr. Torian appears stable.  He continues to tolerate the FOLFIRI well.  Bevacizumab remains on hold secondary to proteinuria.  We will hold G-CSF with this cycle due to the elevated neutrophil count.  Mr. Nitzsche will complete another cycle of FOLFIRI today.  He appears to have an inflamed sebaceous cyst at the suprapubic region.  He will complete a course of doxycycline.  We will make a referral for drainage of the cyst if not improved following the course of antibiotics.  Mr. Vanwhy will return for an office visit and chemotherapy in 2 weeks.  Thornton Papas, MD  08/18/2022  9:01 AM

## 2022-08-18 NOTE — Progress Notes (Signed)
Patient seen by [name] today  Vitals are within treatment parameters.  Labs reviewed by [name] and are not all within treatment parameters. Urine protein 300 . Per MD Truett Perna, ok to treat.   Per physician team, patient is ready for treatment. Please note that modifications are being made to the treatment plan including No Avastin today, no pegfilgrastim with this cycle, per MD Sherrill.

## 2022-08-18 NOTE — Patient Instructions (Signed)
[facility]   The chemotherapy medication bag should finish at 46 hours, 96 hours, or 7 days. For example, if your pump is scheduled for 46 hours and it was put on at 4:00 p.m., it should finish at 2:00 p.m. the day it is scheduled to come off regardless of your appointment time.     Estimated time to finish at 1030 Wednesday, Aug 20, 2022.   If the display on your pump reads "Low Volume" and it is beeping, take the batteries out of the pump and come to the cancer center for it to be taken off.   If the pump alarms go off prior to the pump reading "Low Volume" then call [phone] and someone can assist you.  If the plunger comes out and the chemotherapy medication is leaking out, please use your home chemo spill kit to clean up the spill. Do NOT use paper towels or other household products.  If you have problems or questions regarding your pump, please call either [phone] (24 hours a day) or the cancer center Monday-Friday 8:00 a.m.- 4:30 p.m. at the clinic number and we will assist you. If you are unable to get assistance, then go to the nearest Emergency Department and ask the staff to contact the IV team for assistance.  Discharge Instructions: Thank you for choosing [facility] to provide your oncology and hematology care.   If you have a lab appointment with the Cancer Center, please go directly to the Cancer Center and check in at the registration area.   Wear comfortable clothing and clothing appropriate for easy access to any Portacath or PICC line.   We strive to give you quality time with your provider. You may need to reschedule your appointment if you arrive late (15 or more minutes).  Arriving late affects you and other patients whose appointments are after yours.  Also, if you miss three or more appointments without notifying the office, you may be dismissed from the clinic at the provider's discretion.      For  prescription refill requests, have your pharmacy contact our office and allow 72 hours for refills to be completed.    Today you received the following chemotherapy and/or immunotherapy agents Irinotecan, Leucovorin, Fluorouracil.      To help prevent nausea and vomiting after your treatment, we encourage you to take your nausea medication as directed.  BELOW ARE SYMPTOMS THAT SHOULD BE REPORTED IMMEDIATELY: *FEVER GREATER THAN 100.4 F (38 C) OR HIGHER *CHILLS OR SWEATING *NAUSEA AND VOMITING THAT IS NOT CONTROLLED WITH YOUR NAUSEA MEDICATION *UNUSUAL SHORTNESS OF BREATH *UNUSUAL BRUISING OR BLEEDING *URINARY PROBLEMS (pain or burning when urinating, or frequent urination) *BOWEL PROBLEMS (unusual diarrhea, constipation, pain near the anus) TENDERNESS IN MOUTH AND THROAT WITH OR WITHOUT PRESENCE OF ULCERS (sore throat, sores in mouth, or a toothache) UNUSUAL RASH, SWELLING OR PAIN  UNUSUAL VAGINAL DISCHARGE OR ITCHING   Items with * indicate a potential emergency and should be followed up as soon as possible or go to the Emergency Department if any problems should occur.  Please show the CHEMOTHERAPY ALERT CARD or IMMUNOTHERAPY ALERT CARD at check-in to the Emergency Department and triage nurse.  Should you have questions after your visit or need to cancel or reschedule your appointment, please contact [facility]  Dept: [phone]  and follow the prompts.  Office hours are 8:00 a.m. to 4:30 p.m. Monday - Friday. Please note  that voicemails left after 4:00 p.m. may not be returned until the following business day.  We are closed weekends and major holidays. You have access to a nurse at all times for urgent questions. Please call the main number to the clinic Dept: [phone] and follow the prompts.   For any non-urgent questions, you may also contact your provider using MyChart. We now offer e-Visits for anyone 34 and older to request care online for  non-urgent symptoms. For details visit [email or web address].   Also download the MyChart app! Go to the app store, search "MyChart", open the app, select [place], and log in with your MyChart username and password.  Irinotecan Injection What is this medication? IRINOTECAN (ir in oh TEE kan) treats some types of cancer. It works by slowing down the growth of cancer cells. This medicine may be used for other purposes; ask your health care provider or pharmacist if you have questions. COMMON BRAND NAME(S): Camptosar What should I tell my care team before I take this medication? They need to know if you have any of these conditions: Dehydration Diarrhea Infection, especially a viral infection, such as chickenpox, cold sores, herpes Liver disease Low blood cell levels (white cells, red cells, and platelets) Low levels of electrolytes, such as calcium, magnesium, or potassium in your blood Recent or ongoing radiation An unusual or allergic reaction to irinotecan, other medications, foods, dyes, or preservatives If you or your partner are pregnant or trying to get pregnant Breast-feeding How should I use this medication? This medication is injected into a vein. It is given by your care team in a hospital or clinic setting. Talk to your care team about the use of this medication in children. Special care may be needed. Overdosage: If you think you have taken too much of this medicine contact a poison control center or emergency room at once. NOTE: This medicine is only for you. Do not share this medicine with others. What if I miss a dose? Keep appointments for follow-up doses. It is important not to miss your dose. Call your care team if you are unable to keep an appointment. What may interact with this medication? Do not take this medication with any of the following: Cobicistat Itraconazole This medication may also interact with the following: Certain antibiotics, such as  clarithromycin, rifampin, rifabutin Certain antivirals for HIV or AIDS Certain medications for fungal infections, such as ketoconazole, posaconazole, voriconazole Certain medications for seizures, such as carbamazepine, phenobarbital, phenytoin Gemfibrozil Nefazodone St. John's wort This list may not describe all possible interactions. Give your health care provider a list of all the medicines, herbs, non-prescription drugs, or dietary supplements you use. Also tell them if you smoke, drink alcohol, or use illegal drugs. Some items may interact with your medicine. What should I watch for while using this medication? Your condition will be monitored carefully while you are receiving this medication. You may need blood work while taking this medication. This medication may make you feel generally unwell. This is not uncommon as chemotherapy can affect healthy cells as well as cancer cells. Report any side effects. Continue your course of treatment even though you feel ill unless your care team tells you to stop. This medication can cause serious side effects. To reduce the risk, your care team may give you other medications to take before receiving this one. Be sure to follow the directions from your care team. This medication may affect your coordination, reaction time, or judgement. Do not drive or  operate machinery until you know how this medication affects you. Sit up or stand slowly to reduce the risk of dizzy or fainting spells. Drinking alcohol with this medication can increase the risk of these side effects. This medication may increase your risk of getting an infection. Call your care team for advice if you get a fever, chills, sore throat, or other symptoms of a cold or flu. Do not treat yourself. Try to avoid being around people who are sick. Avoid taking medications that contain aspirin, acetaminophen, ibuprofen, naproxen, or ketoprofen unless instructed by your care team. These medications  may hide a fever. This medication may increase your risk to bruise or bleed. Call your care team if you notice any unusual bleeding. Be careful brushing or flossing your teeth or using a toothpick because you may get an infection or bleed more easily. If you have any dental work done, tell your dentist you are receiving this medication. Talk to your care team if you or your partner are pregnant or think either of you might be pregnant. This medication can cause serious birth defects if taken during pregnancy and for 6 months after the last dose. You will need a negative pregnancy test before starting this medication. Contraception is recommended while taking this medication and for 6 months after the last dose. Your care team can help you find the option that works for you. Do not father a child while taking this medication and for 3 months after the last dose. Use a condom for contraception during this time period. Do not breastfeed while taking this medication and for 7 days after the last dose. This medication may cause infertility. Talk to your care team if you are concerned about your fertility. What side effects may I notice from receiving this medication? Side effects that you should report to your care team as soon as possible: Allergic reactions--skin rash, itching, hives, swelling of the face, lips, tongue, or throat Dry cough, shortness of breath or trouble breathing Increased saliva or tears, increased sweating, stomach cramping, diarrhea, small pupils, unusual weakness or fatigue, slow heartbeat Infection--fever, chills, cough, sore throat, wounds that don't heal, pain or trouble when passing urine, general feeling of discomfort or being unwell Kidney injury--decrease in the amount of urine, swelling of the ankles, hands, or feet Low red blood cell level--unusual weakness or fatigue, dizziness, headache, trouble breathing Severe or prolonged diarrhea Unusual bruising or bleeding Side  effects that usually do not require medical attention (report to your care team if they continue or are bothersome): Constipation Diarrhea Hair loss Loss of appetite Nausea Stomach pain This list may not describe all possible side effects. Call your doctor for medical advice about side effects. You may report side effects to FDA at 1-800-FDA-1088. Where should I keep my medication? This medication is given in a hospital or clinic. It will not be stored at home. NOTE: This sheet is a summary. It may not cover all possible information. If you have questions about this medicine, talk to your doctor, pharmacist, or health care provider.  2023 Elsevier/Gold Standard (2021-07-25 00:00:00) Leucovorin Injection What is this medication? LEUCOVORIN (loo koe VOR in) prevents side effects from certain medications, such as methotrexate. It works by increasing folate levels. This helps protect healthy cells in your body. It may also be used to treat anemia caused by low levels of folate. It can also be used with fluorouracil, a type of chemotherapy, to treat colorectal cancer. It works by increasing the effects  of fluorouracil in the body. This medicine may be used for other purposes; ask your health care provider or pharmacist if you have questions. What should I tell my care team before I take this medication? They need to know if you have any of these conditions: Anemia from low levels of vitamin B12 in the blood An unusual or allergic reaction to leucovorin, folic acid, other medications, foods, dyes, or preservatives Pregnant or trying to get pregnant Breastfeeding How should I use this medication? This medication is injected into a vein or a muscle. It is given by your care team in a hospital or clinic setting. Talk to your care team about the use of this medication in children. Special care may be needed. Overdosage: If you think you have taken too much of this medicine contact a poison control  center or emergency room at once. NOTE: This medicine is only for you. Do not share this medicine with others. What if I miss a dose? Keep appointments for follow-up doses. It is important not to miss your dose. Call your care team if you are unable to keep an appointment. What may interact with this medication? Capecitabine Fluorouracil Phenobarbital Phenytoin Primidone Trimethoprim;sulfamethoxazole This list may not describe all possible interactions. Give your health care provider a list of all the medicines, herbs, non-prescription drugs, or dietary supplements you use. Also tell them if you smoke, drink alcohol, or use illegal drugs. Some items may interact with your medicine. What should I watch for while using this medication? Your condition will be monitored carefully while you are receiving this medication. This medication may increase the side effects of 5-fluorouracil. Tell your care team if you have diarrhea or mouth sores that do not get better or that get worse. What side effects may I notice from receiving this medication? Side effects that you should report to your care team as soon as possible: Allergic reactions--skin rash, itching, hives, swelling of the face, lips, tongue, or throat This list may not describe all possible side effects. Call your doctor for medical advice about side effects. You may report side effects to FDA at 1-800-FDA-1088. Where should I keep my medication? This medication is given in a hospital or clinic. It will not be stored at home. NOTE: This sheet is a summary. It may not cover all possible information. If you have questions about this medicine, talk to your doctor, pharmacist, or health care provider.  2023 Elsevier/Gold Standard (2021-07-26 00:00:00)  Fluorouracil Injection What is this medication? FLUOROURACIL (flure oh YOOR a sil) treats some types of cancer. It works by slowing down the growth of cancer cells. This medicine may be used  for other purposes; ask your health care provider or pharmacist if you have questions. COMMON BRAND NAME(S): Adrucil What should I tell my care team before I take this medication? They need to know if you have any of these conditions: Blood disorders Dihydropyrimidine dehydrogenase (DPD) deficiency Infection, such as chickenpox, cold sores, herpes Kidney disease Liver disease Poor nutrition Recent or ongoing radiation therapy An unusual or allergic reaction to fluorouracil, other medications, foods, dyes, or preservatives If you or your partner are pregnant or trying to get pregnant Breast-feeding How should I use this medication? This medication is injected into a vein. It is administered by your care team in a hospital or clinic setting. Talk to your care team about the use of this medication in children. Special care may be needed. Overdosage: If you think you have taken too  much of this medicine contact a poison control center or emergency room at once. NOTE: This medicine is only for you. Do not share this medicine with others. What if I miss a dose? Keep appointments for follow-up doses. It is important not to miss your dose. Call your care team if you are unable to keep an appointment. What may interact with this medication? Do not take this medication with any of the following: Live virus vaccines This medication may also interact with the following: Medications that treat or prevent blood clots, such as warfarin, enoxaparin, dalteparin This list may not describe all possible interactions. Give your health care provider a list of all the medicines, herbs, non-prescription drugs, or dietary supplements you use. Also tell them if you smoke, drink alcohol, or use illegal drugs. Some items may interact with your medicine. What should I watch for while using this medication? Your condition will be monitored carefully while you are receiving this medication. This medication may make  you feel generally unwell. This is not uncommon as chemotherapy can affect healthy cells as well as cancer cells. Report any side effects. Continue your course of treatment even though you feel ill unless your care team tells you to stop. In some cases, you may be given additional medications to help with side effects. Follow all directions for their use. This medication may increase your risk of getting an infection. Call your care team for advice if you get a fever, chills, sore throat, or other symptoms of a cold or flu. Do not treat yourself. Try to avoid being around people who are sick. This medication may increase your risk to bruise or bleed. Call your care team if you notice any unusual bleeding. Be careful brushing or flossing your teeth or using a toothpick because you may get an infection or bleed more easily. If you have any dental work done, tell your dentist you are receiving this medication. Avoid taking medications that contain aspirin, acetaminophen, ibuprofen, naproxen, or ketoprofen unless instructed by your care team. These medications may hide a fever. Do not treat diarrhea with over the counter products. Contact your care team if you have diarrhea that lasts more than 2 days or if it is severe and watery. This medication can make you more sensitive to the sun. Keep out of the sun. If you cannot avoid being in the sun, wear protective clothing and sunscreen. Do not use sun lamps, tanning beds, or tanning booths. Talk to your care team if you or your partner wish to become pregnant or think you might be pregnant. This medication can cause serious birth defects if taken during pregnancy and for 3 months after the last dose. A reliable form of contraception is recommended while taking this medication and for 3 months after the last dose. Talk to your care team about effective forms of contraception. Do not father a child while taking this medication and for 3 months after the last dose.  Use a condom while having sex during this time period. Do not breastfeed while taking this medication. This medication may cause infertility. Talk to your care team if you are concerned about your fertility. What side effects may I notice from receiving this medication? Side effects that you should report to your care team as soon as possible: Allergic reactions--skin rash, itching, hives, swelling of the face, lips, tongue, or throat Heart attack--pain or tightness in the chest, shoulders, arms, or jaw, nausea, shortness of breath, cold or clammy skin, feeling  faint or lightheaded Heart failure--shortness of breath, swelling of the ankles, feet, or hands, sudden weight gain, unusual weakness or fatigue Heart rhythm changes--fast or irregular heartbeat, dizziness, feeling faint or lightheaded, chest pain, trouble breathing High ammonia level--unusual weakness or fatigue, confusion, loss of appetite, nausea, vomiting, seizures Infection--fever, chills, cough, sore throat, wounds that don't heal, pain or trouble when passing urine, general feeling of discomfort or being unwell Low red blood cell level--unusual weakness or fatigue, dizziness, headache, trouble breathing Pain, tingling, or numbness in the hands or feet, muscle weakness, change in vision, confusion or trouble speaking, loss of balance or coordination, trouble walking, seizures Redness, swelling, and blistering of the skin over hands and feet Severe or prolonged diarrhea Unusual bruising or bleeding Side effects that usually do not require medical attention (report to your care team if they continue or are bothersome): Dry skin Headache Increased tears Nausea Pain, redness, or swelling with sores inside the mouth or throat Sensitivity to light Vomiting This list may not describe all possible side effects. Call your doctor for medical advice about side effects. You may report side effects to FDA at 1-800-FDA-1088. Where should I  keep my medication? This medication is given in a hospital or clinic. It will not be stored at home. NOTE: This sheet is a summary. It may not cover all possible information. If you have questions about this medicine, talk to your doctor, pharmacist, or health care provider.  2023 Elsevier/Gold Standard (2021-07-16 00:00:00)

## 2022-08-19 ENCOUNTER — Other Ambulatory Visit: Payer: Self-pay

## 2022-08-20 ENCOUNTER — Inpatient Hospital Stay: Payer: BC Managed Care – PPO

## 2022-08-20 VITALS — BP 134/86 | HR 75 | Temp 98.2°F | Resp 20

## 2022-08-20 DIAGNOSIS — Z5111 Encounter for antineoplastic chemotherapy: Secondary | ICD-10-CM | POA: Diagnosis not present

## 2022-08-20 DIAGNOSIS — C186 Malignant neoplasm of descending colon: Secondary | ICD-10-CM | POA: Diagnosis not present

## 2022-08-20 DIAGNOSIS — Z5189 Encounter for other specified aftercare: Secondary | ICD-10-CM | POA: Diagnosis not present

## 2022-08-20 DIAGNOSIS — C7951 Secondary malignant neoplasm of bone: Secondary | ICD-10-CM | POA: Diagnosis not present

## 2022-08-20 MED ORDER — HEPARIN SOD (PORK) LOCK FLUSH 100 UNIT/ML IV SOLN
500.0000 [IU] | Freq: Once | INTRAVENOUS | Status: AC | PRN
  Administered 2022-08-20: 500 [IU]

## 2022-08-20 MED ORDER — SODIUM CHLORIDE 0.9% FLUSH
10.0000 mL | INTRAVENOUS | Status: DC | PRN
  Administered 2022-08-20: 10 mL

## 2022-08-20 NOTE — Patient Instructions (Signed)

## 2022-08-24 DIAGNOSIS — C186 Malignant neoplasm of descending colon: Secondary | ICD-10-CM | POA: Diagnosis not present

## 2022-08-25 ENCOUNTER — Other Ambulatory Visit: Payer: Self-pay | Admitting: Oncology

## 2022-08-31 ENCOUNTER — Other Ambulatory Visit: Payer: Self-pay | Admitting: Oncology

## 2022-09-01 ENCOUNTER — Inpatient Hospital Stay: Payer: BC Managed Care – PPO | Admitting: Oncology

## 2022-09-01 ENCOUNTER — Inpatient Hospital Stay: Payer: BC Managed Care – PPO

## 2022-09-01 ENCOUNTER — Inpatient Hospital Stay: Payer: BC Managed Care – PPO | Attending: Oncology

## 2022-09-01 VITALS — BP 132/83 | HR 77 | Resp 18

## 2022-09-01 VITALS — BP 139/88 | HR 86 | Temp 98.2°F | Resp 18 | Ht 72.0 in | Wt 219.7 lb

## 2022-09-01 DIAGNOSIS — Z5189 Encounter for other specified aftercare: Secondary | ICD-10-CM | POA: Diagnosis not present

## 2022-09-01 DIAGNOSIS — Z5111 Encounter for antineoplastic chemotherapy: Secondary | ICD-10-CM | POA: Diagnosis not present

## 2022-09-01 DIAGNOSIS — C7951 Secondary malignant neoplasm of bone: Secondary | ICD-10-CM | POA: Insufficient documentation

## 2022-09-01 DIAGNOSIS — C186 Malignant neoplasm of descending colon: Secondary | ICD-10-CM

## 2022-09-01 LAB — CMP (CANCER CENTER ONLY)
ALT: 19 U/L (ref 0–44)
AST: 16 U/L (ref 15–41)
Albumin: 3.7 g/dL (ref 3.5–5.0)
Alkaline Phosphatase: 67 U/L (ref 38–126)
Anion gap: 7 (ref 5–15)
BUN: 18 mg/dL (ref 6–20)
CO2: 25 mmol/L (ref 22–32)
Calcium: 8.8 mg/dL — ABNORMAL LOW (ref 8.9–10.3)
Chloride: 104 mmol/L (ref 98–111)
Creatinine: 1.01 mg/dL (ref 0.61–1.24)
GFR, Estimated: >60 mL/min (ref >60)
Glucose, Bld: 209 mg/dL — ABNORMAL HIGH (ref 70–99)
Potassium: 4.4 mmol/L (ref 3.5–5.1)
Sodium: 136 mmol/L (ref 135–145)
Total Bilirubin: 1 mg/dL (ref 0.3–1.2)
Total Protein: 6.3 g/dL — ABNORMAL LOW (ref 6.5–8.1)

## 2022-09-01 LAB — CBC WITH DIFFERENTIAL (CANCER CENTER ONLY)
Abs Immature Granulocytes: 0.01 10*3/uL (ref 0.00–0.07)
Basophils Absolute: 0.1 10*3/uL (ref 0.0–0.1)
Basophils Relative: 1 %
Eosinophils Absolute: 0.2 10*3/uL (ref 0.0–0.5)
Eosinophils Relative: 6 %
HCT: 34 % — ABNORMAL LOW (ref 39.0–52.0)
Hemoglobin: 11.8 g/dL — ABNORMAL LOW (ref 13.0–17.0)
Immature Granulocytes: 0 %
Lymphocytes Relative: 24 %
Lymphs Abs: 0.8 10*3/uL (ref 0.7–4.0)
MCH: 34.5 pg — ABNORMAL HIGH (ref 26.0–34.0)
MCHC: 34.7 g/dL (ref 30.0–36.0)
MCV: 99.4 fL (ref 80.0–100.0)
Monocytes Absolute: 0.4 10*3/uL (ref 0.1–1.0)
Monocytes Relative: 12 %
Neutro Abs: 2 10*3/uL (ref 1.7–7.7)
Neutrophils Relative %: 57 %
Platelet Count: 117 10*3/uL — ABNORMAL LOW (ref 150–400)
RBC: 3.42 MIL/uL — ABNORMAL LOW (ref 4.22–5.81)
RDW: 15.6 % — ABNORMAL HIGH (ref 11.5–15.5)
WBC Count: 3.5 10*3/uL — ABNORMAL LOW (ref 4.0–10.5)
nRBC: 0 % (ref 0.0–0.2)

## 2022-09-01 LAB — TOTAL PROTEIN, URINE DIPSTICK: Protein, ur: 30 mg/dL — AB

## 2022-09-01 LAB — CEA (ACCESS): CEA (CHCC): 1.73 ng/mL (ref 0.00–5.00)

## 2022-09-01 MED ORDER — SODIUM CHLORIDE 0.9 % IV SOLN: Freq: Once | INTRAVENOUS | Status: AC

## 2022-09-01 MED ORDER — ATROPINE SULFATE 1 MG/ML IV SOLN
0.5000 mg | Freq: Once | INTRAVENOUS | Status: AC | PRN
  Administered 2022-09-01: 0.5 mg via INTRAVENOUS
  Filled 2022-09-01: qty 1

## 2022-09-01 MED ORDER — PALONOSETRON HCL INJECTION 0.25 MG/5ML
0.2500 mg | Freq: Once | INTRAVENOUS | Status: AC
  Administered 2022-09-01: 0.25 mg via INTRAVENOUS
  Filled 2022-09-01: qty 5

## 2022-09-01 MED ORDER — SODIUM CHLORIDE 0.9 % IV SOLN
400.0000 mg/m2 | Freq: Once | INTRAVENOUS | Status: AC
  Administered 2022-09-01: 892 mg via INTRAVENOUS
  Filled 2022-09-01: qty 44.6

## 2022-09-01 MED ORDER — SODIUM CHLORIDE 0.9 % IV SOLN
10.0000 mg | Freq: Once | INTRAVENOUS | Status: AC
  Administered 2022-09-01: 10 mg via INTRAVENOUS
  Filled 2022-09-01: qty 10

## 2022-09-01 MED ORDER — FLUOROURACIL CHEMO INJECTION 2.5 GM/50ML
400.0000 mg/m2 | Freq: Once | INTRAVENOUS | Status: AC
  Administered 2022-09-01: 900 mg via INTRAVENOUS
  Filled 2022-09-01: qty 18

## 2022-09-01 MED ORDER — SODIUM CHLORIDE 0.9 % IV SOLN
140.0000 mg/m2 | Freq: Once | INTRAVENOUS | Status: AC
  Administered 2022-09-01: 300 mg via INTRAVENOUS
  Filled 2022-09-01: qty 15

## 2022-09-01 MED ORDER — SODIUM CHLORIDE 0.9 % IV SOLN
2400.0000 mg/m2 | INTRAVENOUS | Status: DC
  Administered 2022-09-01: 5000 mg via INTRAVENOUS
  Filled 2022-09-01: qty 100

## 2022-09-01 NOTE — Progress Notes (Signed)
Patient seen by Dr. Sherrill today ? ?Vitals are within treatment parameters. ? ?Labs reviewed by Dr. Sherrill and are within treatment parameters. ? ?Per physician team, patient is ready for treatment and there are NO modifications to the treatment plan.  ?

## 2022-09-01 NOTE — Progress Notes (Signed)
Per MD Truett Perna, no Avastin today.

## 2022-09-01 NOTE — Patient Instructions (Signed)
[facility] AT [facility]   Discharge Instructions: Thank you for choosing [facility] to provide your oncology and hematology care.   If you have a lab appointment with the Cancer Center, please go directly to the Cancer Center and check in at the registration area.   Wear comfortable clothing and clothing appropriate for easy access to any Portacath or PICC line.   We strive to give you quality time with your provider. You may need to reschedule your appointment if you arrive late (15 or more minutes).  Arriving late affects you and other patients whose appointments are after yours.  Also, if you miss three or more appointments without notifying the office, you may be dismissed from the clinic at the provider's discretion.      For prescription refill requests, have your pharmacy contact our office and allow 72 hours for refills to be completed.    Today you received the following chemotherapy and/or immunotherapy agents Irinotecan (CAMPTOSAR), Leucovorin & Flourouracil (ADRUCIL).      To help prevent nausea and vomiting after your treatment, we encourage you to take your nausea medication as directed.  BELOW ARE SYMPTOMS THAT SHOULD BE REPORTED IMMEDIATELY: *FEVER GREATER THAN 100.4 F (38 C) OR HIGHER *CHILLS OR SWEATING *NAUSEA AND VOMITING THAT IS NOT CONTROLLED WITH YOUR NAUSEA MEDICATION *UNUSUAL SHORTNESS OF BREATH *UNUSUAL BRUISING OR BLEEDING *URINARY PROBLEMS (pain or burning when urinating, or frequent urination) *BOWEL PROBLEMS (unusual diarrhea, constipation, pain near the anus) TENDERNESS IN MOUTH AND THROAT WITH OR WITHOUT PRESENCE OF ULCERS (sore throat, sores in mouth, or a toothache) UNUSUAL RASH, SWELLING OR PAIN  UNUSUAL VAGINAL DISCHARGE OR ITCHING   Items with * indicate a potential emergency and should be followed up as soon as possible or go to the Emergency Department if any problems should occur.  Please show the  CHEMOTHERAPY ALERT CARD or IMMUNOTHERAPY ALERT CARD at check-in to the Emergency Department and triage nurse.  Should you have questions after your visit or need to cancel or reschedule your appointment, please contact [facility] AT [facility]  Dept: [phone]  and follow the prompts.  Office hours are 8:00 a.m. to 4:30 p.m. Monday - Friday. Please note that voicemails left after 4:00 p.m. may not be returned until the following business day.  We are closed weekends and major holidays. You have access to a nurse at all times for urgent questions. Please call the main number to the clinic Dept: [phone] and follow the prompts.   For any non-urgent questions, you may also contact your provider using MyChart. We now offer e-Visits for anyone 59 and older to request care online for non-urgent symptoms. For details visit [email or web address].   Also download the MyChart app! Go to the app store, search "MyChart", open the app, select , and log in with your MyChart username and password.  Irinotecan Injection What is this medication? IRINOTECAN (ir in oh TEE kan) treats some types of cancer. It works by slowing down the growth of cancer cells. This medicine may be used for other purposes; ask your health care provider or pharmacist if you have questions. COMMON BRAND NAME(S): Camptosar What should I tell my care team before I take this medication? They need to know if you have any of these conditions: Dehydration Diarrhea Infection, especially a viral infection, such as chickenpox, cold sores, herpes Liver disease Low blood cell levels (white cells, red cells, and platelets) Low levels of electrolytes,  such as calcium, magnesium, or potassium in your blood Recent or ongoing radiation An unusual or allergic reaction to irinotecan, other medications, foods, dyes, or preservatives If you or your partner are pregnant or trying to get  pregnant Breast-feeding How should I use this medication? This medication is injected into a vein. It is given by your care team in a hospital or clinic setting. Talk to your care team about the use of this medication in children. Special care may be needed. Overdosage: If you think you have taken too much of this medicine contact a poison control center or emergency room at once. NOTE: This medicine is only for you. Do not share this medicine with others. What if I miss a dose? Keep appointments for follow-up doses. It is important not to miss your dose. Call your care team if you are unable to keep an appointment. What may interact with this medication? Do not take this medication with any of the following: Cobicistat Itraconazole This medication may also interact with the following: Certain antibiotics, such as clarithromycin, rifampin, rifabutin Certain antivirals for HIV or AIDS Certain medications for fungal infections, such as ketoconazole, posaconazole, voriconazole Certain medications for seizures, such as carbamazepine, phenobarbital, phenytoin Gemfibrozil Nefazodone St. John's wort This list may not describe all possible interactions. Give your health care provider a list of all the medicines, herbs, non-prescription drugs, or dietary supplements you use. Also tell them if you smoke, drink alcohol, or use illegal drugs. Some items may interact with your medicine. What should I watch for while using this medication? Your condition will be monitored carefully while you are receiving this medication. You may need blood work while taking this medication. This medication may make you feel generally unwell. This is not uncommon as chemotherapy can affect healthy cells as well as cancer cells. Report any side effects. Continue your course of treatment even though you feel ill unless your care team tells you to stop. This medication can cause serious side effects. To reduce the risk, your  care team may give you other medications to take before receiving this one. Be sure to follow the directions from your care team. This medication may affect your coordination, reaction time, or judgement. Do not drive or operate machinery until you know how this medication affects you. Sit up or stand slowly to reduce the risk of dizzy or fainting spells. Drinking alcohol with this medication can increase the risk of these side effects. This medication may increase your risk of getting an infection. Call your care team for advice if you get a fever, chills, sore throat, or other symptoms of a cold or flu. Do not treat yourself. Try to avoid being around people who are sick. Avoid taking medications that contain aspirin, acetaminophen, ibuprofen, naproxen, or ketoprofen unless instructed by your care team. These medications may hide a fever. This medication may increase your risk to bruise or bleed. Call your care team if you notice any unusual bleeding. Be careful brushing or flossing your teeth or using a toothpick because you may get an infection or bleed more easily. If you have any dental work done, tell your dentist you are receiving this medication. Talk to your care team if you or your partner are pregnant or think either of you might be pregnant. This medication can cause serious birth defects if taken during pregnancy and for 6 months after the last dose. You will need a negative pregnancy test before starting this medication. Contraception is recommended while  taking this medication and for 6 months after the last dose. Your care team can help you find the option that works for you. Do not father a child while taking this medication and for 3 months after the last dose. Use a condom for contraception during this time period. Do not breastfeed while taking this medication and for 7 days after the last dose. This medication may cause infertility. Talk to your care team if you are concerned about  your fertility. What side effects may I notice from receiving this medication? Side effects that you should report to your care team as soon as possible: Allergic reactions--skin rash, itching, hives, swelling of the face, lips, tongue, or throat Dry cough, shortness of breath or trouble breathing Increased saliva or tears, increased sweating, stomach cramping, diarrhea, small pupils, unusual weakness or fatigue, slow heartbeat Infection--fever, chills, cough, sore throat, wounds that don't heal, pain or trouble when passing urine, general feeling of discomfort or being unwell Kidney injury--decrease in the amount of urine, swelling of the ankles, hands, or feet Low red blood cell level--unusual weakness or fatigue, dizziness, headache, trouble breathing Severe or prolonged diarrhea Unusual bruising or bleeding Side effects that usually do not require medical attention (report to your care team if they continue or are bothersome): Constipation Diarrhea Hair loss Loss of appetite Nausea Stomach pain This list may not describe all possible side effects. Call your doctor for medical advice about side effects. You may report side effects to FDA at 1-800-FDA-1088. Where should I keep my medication? This medication is given in a hospital or clinic. It will not be stored at home. NOTE: This sheet is a summary. It may not cover all possible information. If you have questions about this medicine, talk to your doctor, pharmacist, or health care provider.  2024 Elsevier/Gold Standard (2021-07-29 00:00:00)   Leucovorin Injection What is this medication? LEUCOVORIN (loo koe VOR in) prevents side effects from certain medications, such as methotrexate. It works by increasing folate levels. This helps protect healthy cells in your body. It may also be used to treat anemia caused by low levels of folate. It can also be used with fluorouracil, a type of chemotherapy, to treat colorectal cancer. It works by  increasing the effects of fluorouracil in the body. This medicine may be used for other purposes; ask your health care provider or pharmacist if you have questions. What should I tell my care team before I take this medication? They need to know if you have any of these conditions: Anemia from low levels of vitamin B12 in the blood An unusual or allergic reaction to leucovorin, folic acid, other medications, foods, dyes, or preservatives Pregnant or trying to get pregnant Breastfeeding How should I use this medication? This medication is injected into a vein or a muscle. It is given by your care team in a hospital or clinic setting. Talk to your care team about the use of this medication in children. Special care may be needed. Overdosage: If you think you have taken too much of this medicine contact a poison control center or emergency room at once. NOTE: This medicine is only for you. Do not share this medicine with others. What if I miss a dose? Keep appointments for follow-up doses. It is important not to miss your dose. Call your care team if you are unable to keep an appointment. What may interact with this medication? Capecitabine Fluorouracil Phenobarbital Phenytoin Primidone Trimethoprim;sulfamethoxazole This list may not describe all possible interactions.  Give your health care provider a list of all the medicines, herbs, non-prescription drugs, or dietary supplements you use. Also tell them if you smoke, drink alcohol, or use illegal drugs. Some items may interact with your medicine. What should I watch for while using this medication? Your condition will be monitored carefully while you are receiving this medication. This medication may increase the side effects of 5-fluorouracil. Tell your care team if you have diarrhea or mouth sores that do not get better or that get worse. What side effects may I notice from receiving this medication? Side effects that you should report to  your care team as soon as possible: Allergic reactions--skin rash, itching, hives, swelling of the face, lips, tongue, or throat This list may not describe all possible side effects. Call your doctor for medical advice about side effects. You may report side effects to FDA at 1-800-FDA-1088. Where should I keep my medication? This medication is given in a hospital or clinic. It will not be stored at home. NOTE: This sheet is a summary. It may not cover all possible information. If you have questions about this medicine, talk to your doctor, pharmacist, or health care provider.  2024 Elsevier/Gold Standard (2021-08-20 00:00:00)   Fluorouracil Injection What is this medication? FLUOROURACIL (flure oh YOOR a sil) treats some types of cancer. It works by slowing down the growth of cancer cells. This medicine may be used for other purposes; ask your health care provider or pharmacist if you have questions. COMMON BRAND NAME(S): Adrucil What should I tell my care team before I take this medication? They need to know if you have any of these conditions: Blood disorders Dihydropyrimidine dehydrogenase (DPD) deficiency Infection, such as chickenpox, cold sores, herpes Kidney disease Liver disease Poor nutrition Recent or ongoing radiation therapy An unusual or allergic reaction to fluorouracil, other medications, foods, dyes, or preservatives If you or your partner are pregnant or trying to get pregnant Breast-feeding How should I use this medication? This medication is injected into a vein. It is administered by your care team in a hospital or clinic setting. Talk to your care team about the use of this medication in children. Special care may be needed. Overdosage: If you think you have taken too much of this medicine contact a poison control center or emergency room at once. NOTE: This medicine is only for you. Do not share this medicine with others. What if I miss a dose? Keep  appointments for follow-up doses. It is important not to miss your dose. Call your care team if you are unable to keep an appointment. What may interact with this medication? Do not take this medication with any of the following: Live virus vaccines This medication may also interact with the following: Medications that treat or prevent blood clots, such as warfarin, enoxaparin, dalteparin This list may not describe all possible interactions. Give your health care provider a list of all the medicines, herbs, non-prescription drugs, or dietary supplements you use. Also tell them if you smoke, drink alcohol, or use illegal drugs. Some items may interact with your medicine. What should I watch for while using this medication? Your condition will be monitored carefully while you are receiving this medication. This medication may make you feel generally unwell. This is not uncommon as chemotherapy can affect healthy cells as well as cancer cells. Report any side effects. Continue your course of treatment even though you feel ill unless your care team tells you to stop. In some  cases, you may be given additional medications to help with side effects. Follow all directions for their use. This medication may increase your risk of getting an infection. Call your care team for advice if you get a fever, chills, sore throat, or other symptoms of a cold or flu. Do not treat yourself. Try to avoid being around people who are sick. This medication may increase your risk to bruise or bleed. Call your care team if you notice any unusual bleeding. Be careful brushing or flossing your teeth or using a toothpick because you may get an infection or bleed more easily. If you have any dental work done, tell your dentist you are receiving this medication. Avoid taking medications that contain aspirin, acetaminophen, ibuprofen, naproxen, or ketoprofen unless instructed by your care team. These medications may hide a fever. Do  not treat diarrhea with over the counter products. Contact your care team if you have diarrhea that lasts more than 2 days or if it is severe and watery. This medication can make you more sensitive to the sun. Keep out of the sun. If you cannot avoid being in the sun, wear protective clothing and sunscreen. Do not use sun lamps, tanning beds, or tanning booths. Talk to your care team if you or your partner wish to become pregnant or think you might be pregnant. This medication can cause serious birth defects if taken during pregnancy and for 3 months after the last dose. A reliable form of contraception is recommended while taking this medication and for 3 months after the last dose. Talk to your care team about effective forms of contraception. Do not father a child while taking this medication and for 3 months after the last dose. Use a condom while having sex during this time period. Do not breastfeed while taking this medication. This medication may cause infertility. Talk to your care team if you are concerned about your fertility. What side effects may I notice from receiving this medication? Side effects that you should report to your care team as soon as possible: Allergic reactions--skin rash, itching, hives, swelling of the face, lips, tongue, or throat Heart attack--pain or tightness in the chest, shoulders, arms, or jaw, nausea, shortness of breath, cold or clammy skin, feeling faint or lightheaded Heart failure--shortness of breath, swelling of the ankles, feet, or hands, sudden weight gain, unusual weakness or fatigue Heart rhythm changes--fast or irregular heartbeat, dizziness, feeling faint or lightheaded, chest pain, trouble breathing High ammonia level--unusual weakness or fatigue, confusion, loss of appetite, nausea, vomiting, seizures Infection--fever, chills, cough, sore throat, wounds that don't heal, pain or trouble when passing urine, general feeling of discomfort or being  unwell Low red blood cell level--unusual weakness or fatigue, dizziness, headache, trouble breathing Pain, tingling, or numbness in the hands or feet, muscle weakness, change in vision, confusion or trouble speaking, loss of balance or coordination, trouble walking, seizures Redness, swelling, and blistering of the skin over hands and feet Severe or prolonged diarrhea Unusual bruising or bleeding Side effects that usually do not require medical attention (report to your care team if they continue or are bothersome): Dry skin Headache Increased tears Nausea Pain, redness, or swelling with sores inside the mouth or throat Sensitivity to light Vomiting This list may not describe all possible side effects. Call your doctor for medical advice about side effects. You may report side effects to FDA at 1-800-FDA-1088. Where should I keep my medication? This medication is given in a hospital or clinic. It  will not be stored at home. NOTE: This sheet is a summary. It may not cover all possible information. If you have questions about this medicine, talk to your doctor, pharmacist, or health care provider.  2024 Elsevier/Gold Standard (2021-07-23 00:00:00)   The chemotherapy medication bag should finish at 46 hours, 96 hours, or 7 days. For example, if your pump is scheduled for 46 hours and it was put on at 4:00 p.m., it should finish at 2:00 p.m. the day it is scheduled to come off regardless of your appointment time.     Estimated time to finish at 11:00 a.m. on Wednesday 09/03/2022.   If the display on your pump reads "Low Volume" and it is beeping, take the batteries out of the pump and come to the cancer center for it to be taken off.   If the pump alarms go off prior to the pump reading "Low Volume" then call [phone] and someone can assist you.  If the plunger comes out and the chemotherapy medication is leaking out, please use your home chemo spill kit to clean up the spill. Do NOT  use paper towels or other household products.  If you have problems or questions regarding your pump, please call either [phone] (24 hours a day) or the cancer center Monday-Friday 8:00 a.m.- 4:30 p.m. at the clinic number and we will assist you. If you are unable to get assistance, then go to the nearest Emergency Department and ask the staff to contact the IV team for assistance.

## 2022-09-01 NOTE — Progress Notes (Signed)
[facility] OFFICE PROGRESS NOTE   Diagnosis: Colon cancer  INTERVAL HISTORY:   [name] completed another cycle of FOLFIRI on 08/20/2022.  No known/vomiting, mouth sores, or diarrhea.  The left lower leg became more swollen yesterday and is partially improved today.  He was on his feet for several extended activities over the weekend.  Mild discomfort at the right shoulder.  No other pain.  The inflamed cyst at the suprapubic area improved following doxycycline.  Objective:  Vital signs in last 24 hours:  Blood pressure 139/88, pulse 86, temperature 98.2 F (36.8 C), temperature source Oral, resp. rate 18, height 6' (1.829 m), weight 219 lb 11.2 oz (99.7 kg), SpO2 100 %.    HEENT: No thrush or ulcers. Resp: Lungs clear bilaterally Cardio: Regular rate and rhythm GI: No hepatosplenomegaly, 2 cm firm nodular fullness in the suprapubic area without erythema or exudate Vascular: Trace edema at the left lower leg, no erythema or tenderness   Portacath/PICC-without erythema  Lab Results:  Lab Results  Component Value Date   WBC 3.5 (L) 09/01/2022   HGB 11.8 (L) 09/01/2022   HCT 34.0 (L) 09/01/2022   MCV 99.4 09/01/2022   PLT 117 (L) 09/01/2022   NEUTROABS 2.0 09/01/2022    CMP  Lab Results  Component Value Date   NA 136 09/01/2022   K 4.4 09/01/2022   CL 104 09/01/2022   CO2 25 09/01/2022   GLUCOSE 209 (H) 09/01/2022   BUN 18 09/01/2022   CREATININE 1.01 09/01/2022   CALCIUM 8.8 (L) 09/01/2022   PROT 6.3 (L) 09/01/2022   ALBUMIN 3.7 09/01/2022   AST 16 09/01/2022   ALT 19 09/01/2022   ALKPHOS 67 09/01/2022   BILITOT 1.0 09/01/2022   GFRNONAA >60 09/01/2022   GFRAA 115 02/08/2020    Lab Results  Component Value Date   CEA1 2.00 07/13/2020   CEA 1.97 08/18/2022    Lab Results  Component Value Date   INR 1.1 08/13/2021   LABPROT 14.2 08/13/2021    Imaging:  No results found.  Medications: I have reviewed the patient's current  medications.   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, status post a left colectomy 03/04/2019 Mass felt to be in the transverse colon on a colonoscopy 01/07/2019 with a biopsy confirming poorly differentiated adenocarcinoma with signet ring cell features, mismatch repair protein expression normal CT abdomen/pelvis 01/08/2019 12.3 cm indeterminate splenic mass, present in 2015 on a lumbar MRI-felt to be benign, small subpleural and pleural-based nodules at the lung bases, status post left nephrectomy CT chest 01/20/2019-small bilateral pulmonary nodules with rounded parenchymal nodules in the right lower lobe, indeterminate splenic mass CT chest 03/18/2019-multiple subcentimeter pulmonary nodules again seen bilaterally, greatest in the lower lobes, without significant change.  No new or enlarging pulmonary nodules or masses.  Partially visualized large heterogeneous splenic mass with no significant change.  Plan for follow-up chest CT at a 101-month interval. Cycle 1 FOLFOX 03/21/2019 Cycle 2 FOLFOX 04/04/2019 Cycle 3 FOLFOX 04/18/2019 (oxaliplatin dose reduced secondary to thrombocytopenia) Cycle 4 FOLFOX 05/02/2019 (oxaliplatin held secondary to thrombocytopenia) Cycle 5 FOLFOX 05/16/2019 Cycle 6 FOLFOX 05/30/2019 (oxaliplatin held secondary to thrombocytopenia) CT chest 06/08/2019-stable scattered small solid pulmonary nodules  Cycle 7 FOLFOX 06/13/2019 Cycle 8 FOLFOX 06/27/2019 Cycle 9 FOLFOX 07/11/2019 Cycle 10 FOLFOX 07/25/2019 (oxaliplatin held due to thrombocytopenia) Cycle 11 FOLFOX 08/08/2019 Cycle 12 FOLFOX 08/22/2019 CTs 01/09/2020-no evidence of recurrent disease,  stable lung nodules favored to represent subpleural lymph nodes-dedicated follow-up not recommended CTs 01/11/2021-no evidence of recurrent disease CTs 01/24/2022-no evidence of recurrent disease CT lumbar spine,  abdomen/pelvis 04/11/2022-numerous mixed lytic and sclerotic lesions scattered throughout the visualized axial and appendicular skeleton including several areas in the lumbar spine.   04/21/2022 CT biopsy sclerotic lesion right anterior iliac bone-consistent with metastatic colorectal adenocarcinoma, moderate to poorly differentiated.  Foundation 1-microsatellite stable, tumor mutation burden 4, K-ras G12S PET scan 05/09/2022-widespread hypermetabolic mixed faintly lytic and sclerotic osseous metastases throughout the axial and proximal appendicular skeleton; associated mild pathologic compression fracture at L3; no hypermetabolic extraosseous metastatic disease. Cycle 1 FOLFIRI/bevacizumab 05/26/2022 Cycle 2 FOLFIRI/bevacizumab 06/09/2022 Cycle 3 FOLFIRI 06/24/2022, bevacizumab 06/26/2022 Cycle 4 FOLFIRI 07/08/2022, bevacizumab held due to proteinuria Cycle 5 FOLFIRI 07/22/2022, bevacizumab held due to proteinuria CTs 07/31/2022-potentially areas of sclerosis represent interval healing; new lytic lesion at T12, multiple new areas of punched-out sclerosis throughout the visualized osseous structures.  Index lesion in the left iliac wing measures 1.3 x 2.8 cm.  Healing of previously metabolically occult metastatic disease on 05/09/2022 is a possibility. 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  Left nephrectomy at age 57 12.  Port-A-Cath placement, Dr. Michaell Cowing, 03/16/2019 13.  Thrombocytopenia secondary to chemotherapy-oxaliplatin dose reduced beginning with cycle 3 FOLFOX, improved 14.  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  Right groin soft tissue infection May 2023-status postsurgical debridement 18.  Left leg DVT-common femoral, femoral, popliteal, posterior tibial, and peroneal veins 19.  02/04/2022-MRI cervical spine-1.7 cm T1 lesion-indeterminate; bone scan 03/14/2022-focal intense activity at approximate T1 level felt to correspond to the lesion on MRI, additional foci of activity involving the lower thoracic and lumbar spine, right iliac bone and pubic symphysis.  CT lumbar spine, abdomen/pelvis 04/11/2022-numerous mixed lytic and sclerotic lesions scattered throughout the visualized axial and appendicular skeleton including several areas in the lumbar spine.  04/21/2022 CT biopsy sclerotic lesion right anterior iliac bone-consistent with metastatic colorectal adenocarcinoma, moderate to poorly differentiated 20.  Inflamed sebaceous cyst 08/18/2022-doxycycline      Disposition: Mr [name] appears well.  He continues to tolerate the FOLFIRI well.  He will complete another cycle today.  Bevacizumab will remain on hold.  Bevacizumab will be added back to the chemotherapy regimen if the urine protein remains low in 2 weeks.  He will receive G-CSF with this cycle.  The inflamed cyst at the lower abdomen appears improved.  He will call if the erythema recurs.  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  He continues anticoagulation therapy.  Thornton Papas, MD  09/01/2022  9:37 AM

## 2022-09-03 ENCOUNTER — Telehealth: Payer: Self-pay | Admitting: Oncology

## 2022-09-03 ENCOUNTER — Inpatient Hospital Stay: Payer: BC Managed Care – PPO

## 2022-09-03 VITALS — BP 132/84 | HR 68 | Resp 18

## 2022-09-03 DIAGNOSIS — Z5189 Encounter for other specified aftercare: Secondary | ICD-10-CM | POA: Diagnosis not present

## 2022-09-03 DIAGNOSIS — Z5111 Encounter for antineoplastic chemotherapy: Secondary | ICD-10-CM | POA: Diagnosis not present

## 2022-09-03 DIAGNOSIS — C186 Malignant neoplasm of descending colon: Secondary | ICD-10-CM | POA: Diagnosis not present

## 2022-09-03 DIAGNOSIS — C7951 Secondary malignant neoplasm of bone: Secondary | ICD-10-CM | POA: Diagnosis not present

## 2022-09-03 MED ORDER — SODIUM CHLORIDE 0.9% FLUSH
10.0000 mL | INTRAVENOUS | Status: DC | PRN
  Administered 2022-09-03: 10 mL

## 2022-09-03 MED ORDER — PEGFILGRASTIM-CBQV 6 MG/0.6ML ~~LOC~~ SOSY
6.0000 mg | PREFILLED_SYRINGE | Freq: Once | SUBCUTANEOUS | Status: AC
  Administered 2022-09-03: 6 mg via SUBCUTANEOUS
  Filled 2022-09-03: qty 0.6

## 2022-09-03 MED ORDER — HEPARIN SOD (PORK) LOCK FLUSH 100 UNIT/ML IV SOLN
500.0000 [IU] | Freq: Once | INTRAVENOUS | Status: AC | PRN
  Administered 2022-09-03: 500 [IU]

## 2022-09-04 ENCOUNTER — Other Ambulatory Visit: Payer: Self-pay

## 2022-09-14 ENCOUNTER — Other Ambulatory Visit: Payer: Self-pay | Admitting: Oncology

## 2022-09-15 ENCOUNTER — Inpatient Hospital Stay: Payer: BC Managed Care – PPO

## 2022-09-15 ENCOUNTER — Inpatient Hospital Stay: Payer: BC Managed Care – PPO | Admitting: Oncology

## 2022-09-16 ENCOUNTER — Inpatient Hospital Stay (HOSPITAL_BASED_OUTPATIENT_CLINIC_OR_DEPARTMENT_OTHER): Payer: BC Managed Care – PPO | Admitting: Nurse Practitioner

## 2022-09-16 ENCOUNTER — Inpatient Hospital Stay: Payer: BC Managed Care – PPO

## 2022-09-16 ENCOUNTER — Encounter: Payer: Self-pay | Admitting: Nurse Practitioner

## 2022-09-16 VITALS — BP 137/86 | HR 79 | Temp 98.1°F | Resp 18 | Ht 72.0 in | Wt 216.6 lb

## 2022-09-16 VITALS — BP 116/82 | HR 64 | Resp 18

## 2022-09-16 DIAGNOSIS — C186 Malignant neoplasm of descending colon: Secondary | ICD-10-CM

## 2022-09-16 DIAGNOSIS — C7951 Secondary malignant neoplasm of bone: Secondary | ICD-10-CM | POA: Diagnosis not present

## 2022-09-16 DIAGNOSIS — Z5111 Encounter for antineoplastic chemotherapy: Secondary | ICD-10-CM | POA: Diagnosis not present

## 2022-09-16 DIAGNOSIS — Z5189 Encounter for other specified aftercare: Secondary | ICD-10-CM | POA: Diagnosis not present

## 2022-09-16 LAB — CBC WITH DIFFERENTIAL (CANCER CENTER ONLY)
Abs Immature Granulocytes: 0.25 10*3/uL — ABNORMAL HIGH (ref 0.00–0.07)
Basophils Absolute: 0.1 10*3/uL (ref 0.0–0.1)
Basophils Relative: 1 %
Eosinophils Absolute: 0.2 10*3/uL (ref 0.0–0.5)
Eosinophils Relative: 3 %
HCT: 37.1 % — ABNORMAL LOW (ref 39.0–52.0)
Hemoglobin: 12.5 g/dL — ABNORMAL LOW (ref 13.0–17.0)
Immature Granulocytes: 3 %
Lymphocytes Relative: 10 %
Lymphs Abs: 0.9 10*3/uL (ref 0.7–4.0)
MCH: 34.6 pg — ABNORMAL HIGH (ref 26.0–34.0)
MCHC: 33.7 g/dL (ref 30.0–36.0)
MCV: 102.8 fL — ABNORMAL HIGH (ref 80.0–100.0)
Monocytes Absolute: 0.7 10*3/uL (ref 0.1–1.0)
Monocytes Relative: 8 %
Neutro Abs: 6.7 10*3/uL (ref 1.7–7.7)
Neutrophils Relative %: 75 %
Platelet Count: 93 10*3/uL — ABNORMAL LOW (ref 150–400)
RBC: 3.61 MIL/uL — ABNORMAL LOW (ref 4.22–5.81)
RDW: 15.6 % — ABNORMAL HIGH (ref 11.5–15.5)
WBC Count: 8.9 10*3/uL (ref 4.0–10.5)
nRBC: 0 % (ref 0.0–0.2)

## 2022-09-16 LAB — CMP (CANCER CENTER ONLY)
ALT: 21 U/L (ref 0–44)
AST: 20 U/L (ref 15–41)
Albumin: 3.9 g/dL (ref 3.5–5.0)
Alkaline Phosphatase: 80 U/L (ref 38–126)
Anion gap: 9 (ref 5–15)
BUN: 18 mg/dL (ref 6–20)
CO2: 24 mmol/L (ref 22–32)
Calcium: 9.1 mg/dL (ref 8.9–10.3)
Chloride: 103 mmol/L (ref 98–111)
Creatinine: 1.04 mg/dL (ref 0.61–1.24)
GFR, Estimated: >60 mL/min (ref >60)
Glucose, Bld: 133 mg/dL — ABNORMAL HIGH (ref 70–99)
Potassium: 4.3 mmol/L (ref 3.5–5.1)
Sodium: 136 mmol/L (ref 135–145)
Total Bilirubin: 0.6 mg/dL (ref 0.3–1.2)
Total Protein: 6.7 g/dL (ref 6.5–8.1)

## 2022-09-16 LAB — TOTAL PROTEIN, URINE DIPSTICK: Protein, ur: 100 mg/dL — AB

## 2022-09-16 LAB — CEA (ACCESS): CEA (CHCC): 1.53 ng/mL (ref 0.00–5.00)

## 2022-09-16 MED ORDER — SODIUM CHLORIDE 0.9 % IV SOLN
10.0000 mg | Freq: Once | INTRAVENOUS | Status: AC
  Administered 2022-09-16: 10 mg via INTRAVENOUS
  Filled 2022-09-16: qty 10

## 2022-09-16 MED ORDER — PALONOSETRON HCL INJECTION 0.25 MG/5ML
0.2500 mg | Freq: Once | INTRAVENOUS | Status: AC
  Administered 2022-09-16: 0.25 mg via INTRAVENOUS
  Filled 2022-09-16: qty 5

## 2022-09-16 MED ORDER — SODIUM CHLORIDE 0.9 % IV SOLN
400.0000 mg/m2 | Freq: Once | INTRAVENOUS | Status: AC
  Administered 2022-09-16: 892 mg via INTRAVENOUS
  Filled 2022-09-16: qty 44.6

## 2022-09-16 MED ORDER — SODIUM CHLORIDE 0.9 % IV SOLN
2400.0000 mg/m2 | INTRAVENOUS | Status: DC
  Administered 2022-09-16: 5000 mg via INTRAVENOUS
  Filled 2022-09-16: qty 100

## 2022-09-16 MED ORDER — SODIUM CHLORIDE 0.9 % IV SOLN
140.0000 mg/m2 | Freq: Once | INTRAVENOUS | Status: AC
  Administered 2022-09-16: 300 mg via INTRAVENOUS
  Filled 2022-09-16: qty 15

## 2022-09-16 MED ORDER — SODIUM CHLORIDE 0.9 % IV SOLN: Freq: Once | INTRAVENOUS | Status: AC

## 2022-09-16 MED ORDER — ATROPINE SULFATE 1 MG/ML IV SOLN
0.5000 mg | Freq: Once | INTRAVENOUS | Status: AC | PRN
  Administered 2022-09-16: 0.5 mg via INTRAVENOUS
  Filled 2022-09-16: qty 1

## 2022-09-16 MED ORDER — FLUOROURACIL CHEMO INJECTION 2.5 GM/50ML
400.0000 mg/m2 | Freq: Once | INTRAVENOUS | Status: AC
  Administered 2022-09-16: 900 mg via INTRAVENOUS
  Filled 2022-09-16: qty 18

## 2022-09-16 NOTE — Patient Instructions (Signed)
[name]   Discharge Instructions: Thank you for choosing [facility] to provide your oncology and hematology care.   If you have a lab appointment with the [facility], please go directly to the [facility] and check in at the registration area.   Wear comfortable clothing and clothing appropriate for easy access to any Portacath or PICC line.   We strive to give you quality time with your provider. You may need to reschedule your appointment if you arrive late (15 or more minutes).  Arriving late affects you and other patients whose appointments are after yours.  Also, if you miss three or more appointments without notifying the office, you may be dismissed from the clinic at the provider's discretion.      For prescription refill requests, have your pharmacy contact our office and allow 72 hours for refills to be completed.    Today you received the following chemotherapy and/or immunotherapy agents Irinotecan (CAMPTOSAR), Leucovorin & Flourouracil (ADRUCIL).      To help prevent nausea and vomiting after your treatment, we encourage you to take your nausea medication as directed.  BELOW ARE SYMPTOMS THAT SHOULD BE REPORTED IMMEDIATELY: *FEVER GREATER THAN 100.4 F (38 C) OR HIGHER *CHILLS OR SWEATING *NAUSEA AND VOMITING THAT IS NOT CONTROLLED WITH YOUR NAUSEA MEDICATION *UNUSUAL SHORTNESS OF BREATH *UNUSUAL BRUISING OR BLEEDING *URINARY PROBLEMS (pain or burning when urinating, or frequent urination) *BOWEL PROBLEMS (unusual diarrhea, constipation, pain near the anus) TENDERNESS IN MOUTH AND THROAT WITH OR WITHOUT PRESENCE OF ULCERS (sore throat, sores in mouth, or a toothache) UNUSUAL RASH, SWELLING OR PAIN  UNUSUAL VAGINAL DISCHARGE OR ITCHING   Items with * indicate a potential emergency and should be followed up as soon as possible or go to the Emergency Department if any problems should occur.  Please show the  CHEMOTHERAPY ALERT CARD or IMMUNOTHERAPY ALERT CARD at check-in to the Emergency Department and triage nurse.  Should you have questions after your visit or need to cancel or reschedule your appointment, please contact [facility]  Dept: [phone]  and follow the prompts.  Office hours are 8:00 a.m. to 4:30 p.m. Monday - Friday. Please note that voicemails left after 4:00 p.m. may not be returned until the following business day.  We are closed weekends and major holidays. You have access to a nurse at all times for urgent questions. Please call the main number to the clinic Dept: [phone] and follow the prompts.   For any non-urgent questions, you may also contact your provider using MyChart. We now offer e-Visits for anyone 4 and older to request care online for non-urgent symptoms. For details visit [email or web address].   Also download the MyChart app! Go to the app store, search "MyChart", open the app, select [facility], and log in with your MyChart username and password.  Irinotecan Injection What is this medication? IRINOTECAN (ir in oh TEE kan) treats some types of cancer. It works by slowing down the growth of cancer cells. This medicine may be used for other purposes; ask your health care provider or pharmacist if you have questions. COMMON BRAND NAME(S): Camptosar What should I tell my care team before I take this medication? They need to know if you have any of these conditions: Dehydration Diarrhea Infection, especially a viral infection, such as chickenpox, cold sores, herpes Liver disease Low blood cell levels (white cells, red cells, and platelets) Low levels of electrolytes,  such as calcium, magnesium, or potassium in your blood Recent or ongoing radiation An unusual or allergic reaction to irinotecan, other medications, foods, dyes, or preservatives If you or your partner are pregnant or trying to get  pregnant Breast-feeding How should I use this medication? This medication is injected into a vein. It is given by your care team in a hospital or clinic setting. Talk to your care team about the use of this medication in children. Special care may be needed. Overdosage: If you think you have taken too much of this medicine contact a poison control center or emergency room at once. NOTE: This medicine is only for you. Do not share this medicine with others. What if I miss a dose? Keep appointments for follow-up doses. It is important not to miss your dose. Call your care team if you are unable to keep an appointment. What may interact with this medication? Do not take this medication with any of the following: Cobicistat Itraconazole This medication may also interact with the following: Certain antibiotics, such as clarithromycin, rifampin, rifabutin Certain antivirals for HIV or AIDS Certain medications for fungal infections, such as ketoconazole, posaconazole, voriconazole Certain medications for seizures, such as carbamazepine, phenobarbital, phenytoin Gemfibrozil Nefazodone St. John's wort This list may not describe all possible interactions. Give your health care provider a list of all the medicines, herbs, non-prescription drugs, or dietary supplements you use. Also tell them if you smoke, drink alcohol, or use illegal drugs. Some items may interact with your medicine. What should I watch for while using this medication? Your condition will be monitored carefully while you are receiving this medication. You may need blood work while taking this medication. This medication may make you feel generally unwell. This is not uncommon as chemotherapy can affect healthy cells as well as cancer cells. Report any side effects. Continue your course of treatment even though you feel ill unless your care team tells you to stop. This medication can cause serious side effects. To reduce the risk, your  care team may give you other medications to take before receiving this one. Be sure to follow the directions from your care team. This medication may affect your coordination, reaction time, or judgement. Do not drive or operate machinery until you know how this medication affects you. Sit up or stand slowly to reduce the risk of dizzy or fainting spells. Drinking alcohol with this medication can increase the risk of these side effects. This medication may increase your risk of getting an infection. Call your care team for advice if you get a fever, chills, sore throat, or other symptoms of a cold or flu. Do not treat yourself. Try to avoid being around people who are sick. Avoid taking medications that contain aspirin, acetaminophen, ibuprofen, naproxen, or ketoprofen unless instructed by your care team. These medications may hide a fever. This medication may increase your risk to bruise or bleed. Call your care team if you notice any unusual bleeding. Be careful brushing or flossing your teeth or using a toothpick because you may get an infection or bleed more easily. If you have any dental work done, tell your dentist you are receiving this medication. Talk to your care team if you or your partner are pregnant or think either of you might be pregnant. This medication can cause serious birth defects if taken during pregnancy and for 6 months after the last dose. You will need a negative pregnancy test before starting this medication. Contraception is recommended while  taking this medication and for 6 months after the last dose. Your care team can help you find the option that works for you. Do not father a child while taking this medication and for 3 months after the last dose. Use a condom for contraception during this time period. Do not breastfeed while taking this medication and for 7 days after the last dose. This medication may cause infertility. Talk to your care team if you are concerned about  your fertility. What side effects may I notice from receiving this medication? Side effects that you should report to your care team as soon as possible: Allergic reactions--skin rash, itching, hives, swelling of the face, lips, tongue, or throat Dry cough, shortness of breath or trouble breathing Increased saliva or tears, increased sweating, stomach cramping, diarrhea, small pupils, unusual weakness or fatigue, slow heartbeat Infection--fever, chills, cough, sore throat, wounds that don't heal, pain or trouble when passing urine, general feeling of discomfort or being unwell Kidney injury--decrease in the amount of urine, swelling of the ankles, hands, or feet Low red blood cell level--unusual weakness or fatigue, dizziness, headache, trouble breathing Severe or prolonged diarrhea Unusual bruising or bleeding Side effects that usually do not require medical attention (report to your care team if they continue or are bothersome): Constipation Diarrhea Hair loss Loss of appetite Nausea Stomach pain This list may not describe all possible side effects. Call your doctor for medical advice about side effects. You may report side effects to FDA at 1-800-FDA-1088. Where should I keep my medication? This medication is given in a hospital or clinic. It will not be stored at home. NOTE: This sheet is a summary. It may not cover all possible information. If you have questions about this medicine, talk to your doctor, pharmacist, or health care provider.  2024 Elsevier/Gold Standard (2021-07-29 00:00:00)  Leucovorin Injection What is this medication? LEUCOVORIN (loo koe VOR in) prevents side effects from certain medications, such as methotrexate. It works by increasing folate levels. This helps protect healthy cells in your body. It may also be used to treat anemia caused by low levels of folate. It can also be used with fluorouracil, a type of chemotherapy, to treat colorectal cancer. It works by  increasing the effects of fluorouracil in the body. This medicine may be used for other purposes; ask your health care provider or pharmacist if you have questions. What should I tell my care team before I take this medication? They need to know if you have any of these conditions: Anemia from low levels of vitamin B12 in the blood An unusual or allergic reaction to leucovorin, folic acid, other medications, foods, dyes, or preservatives Pregnant or trying to get pregnant Breastfeeding How should I use this medication? This medication is injected into a vein or a muscle. It is given by your care team in a hospital or clinic setting. Talk to your care team about the use of this medication in children. Special care may be needed. Overdosage: If you think you have taken too much of this medicine contact a poison control center or emergency room at once. NOTE: This medicine is only for you. Do not share this medicine with others. What if I miss a dose? Keep appointments for follow-up doses. It is important not to miss your dose. Call your care team if you are unable to keep an appointment. What may interact with this medication? Capecitabine Fluorouracil Phenobarbital Phenytoin Primidone Trimethoprim;sulfamethoxazole This list may not describe all possible interactions. Give  your health care provider a list of all the medicines, herbs, non-prescription drugs, or dietary supplements you use. Also tell them if you smoke, drink alcohol, or use illegal drugs. Some items may interact with your medicine. What should I watch for while using this medication? Your condition will be monitored carefully while you are receiving this medication. This medication may increase the side effects of 5-fluorouracil. Tell your care team if you have diarrhea or mouth sores that do not get better or that get worse. What side effects may I notice from receiving this medication? Side effects that you should report to  your care team as soon as possible: Allergic reactions--skin rash, itching, hives, swelling of the face, lips, tongue, or throat This list may not describe all possible side effects. Call your doctor for medical advice about side effects. You may report side effects to FDA at 1-800-FDA-1088. Where should I keep my medication? This medication is given in a hospital or clinic. It will not be stored at home. NOTE: This sheet is a summary. It may not cover all possible information. If you have questions about this medicine, talk to your doctor, pharmacist, or health care provider.  2024 Elsevier/Gold Standard (2021-08-20 00:00:00)  Fluorouracil Injection What is this medication? FLUOROURACIL (flure oh YOOR a sil) treats some types of cancer. It works by slowing down the growth of cancer cells. This medicine may be used for other purposes; ask your health care provider or pharmacist if you have questions. COMMON BRAND NAME(S): Adrucil What should I tell my care team before I take this medication? They need to know if you have any of these conditions: Blood disorders Dihydropyrimidine dehydrogenase (DPD) deficiency Infection, such as chickenpox, cold sores, herpes Kidney disease Liver disease Poor nutrition Recent or ongoing radiation therapy An unusual or allergic reaction to fluorouracil, other medications, foods, dyes, or preservatives If you or your partner are pregnant or trying to get pregnant Breast-feeding How should I use this medication? This medication is injected into a vein. It is administered by your care team in a hospital or clinic setting. Talk to your care team about the use of this medication in children. Special care may be needed. Overdosage: If you think you have taken too much of this medicine contact a poison control center or emergency room at once. NOTE: This medicine is only for you. Do not share this medicine with others. What if I miss a dose? Keep appointments  for follow-up doses. It is important not to miss your dose. Call your care team if you are unable to keep an appointment. What may interact with this medication? Do not take this medication with any of the following: Live virus vaccines This medication may also interact with the following: Medications that treat or prevent blood clots, such as warfarin, enoxaparin, dalteparin This list may not describe all possible interactions. Give your health care provider a list of all the medicines, herbs, non-prescription drugs, or dietary supplements you use. Also tell them if you smoke, drink alcohol, or use illegal drugs. Some items may interact with your medicine. What should I watch for while using this medication? Your condition will be monitored carefully while you are receiving this medication. This medication may make you feel generally unwell. This is not uncommon as chemotherapy can affect healthy cells as well as cancer cells. Report any side effects. Continue your course of treatment even though you feel ill unless your care team tells you to stop. In some cases, you  may be given additional medications to help with side effects. Follow all directions for their use. This medication may increase your risk of getting an infection. Call your care team for advice if you get a fever, chills, sore throat, or other symptoms of a cold or flu. Do not treat yourself. Try to avoid being around people who are sick. This medication may increase your risk to bruise or bleed. Call your care team if you notice any unusual bleeding. Be careful brushing or flossing your teeth or using a toothpick because you may get an infection or bleed more easily. If you have any dental work done, tell your dentist you are receiving this medication. Avoid taking medications that contain aspirin, acetaminophen, ibuprofen, naproxen, or ketoprofen unless instructed by your care team. These medications may hide a fever. Do not treat  diarrhea with over the counter products. Contact your care team if you have diarrhea that lasts more than 2 days or if it is severe and watery. This medication can make you more sensitive to the sun. Keep out of the sun. If you cannot avoid being in the sun, wear protective clothing and sunscreen. Do not use sun lamps, tanning beds, or tanning booths. Talk to your care team if you or your partner wish to become pregnant or think you might be pregnant. This medication can cause serious birth defects if taken during pregnancy and for 3 months after the last dose. A reliable form of contraception is recommended while taking this medication and for 3 months after the last dose. Talk to your care team about effective forms of contraception. Do not father a child while taking this medication and for 3 months after the last dose. Use a condom while having sex during this time period. Do not breastfeed while taking this medication. This medication may cause infertility. Talk to your care team if you are concerned about your fertility. What side effects may I notice from receiving this medication? Side effects that you should report to your care team as soon as possible: Allergic reactions--skin rash, itching, hives, swelling of the face, lips, tongue, or throat Heart attack--pain or tightness in the chest, shoulders, arms, or jaw, nausea, shortness of breath, cold or clammy skin, feeling faint or lightheaded Heart failure--shortness of breath, swelling of the ankles, feet, or hands, sudden weight gain, unusual weakness or fatigue Heart rhythm changes--fast or irregular heartbeat, dizziness, feeling faint or lightheaded, chest pain, trouble breathing High ammonia level--unusual weakness or fatigue, confusion, loss of appetite, nausea, vomiting, seizures Infection--fever, chills, cough, sore throat, wounds that don't heal, pain or trouble when passing urine, general feeling of discomfort or being unwell Low red  blood cell level--unusual weakness or fatigue, dizziness, headache, trouble breathing Pain, tingling, or numbness in the hands or feet, muscle weakness, change in vision, confusion or trouble speaking, loss of balance or coordination, trouble walking, seizures Redness, swelling, and blistering of the skin over hands and feet Severe or prolonged diarrhea Unusual bruising or bleeding Side effects that usually do not require medical attention (report to your care team if they continue or are bothersome): Dry skin Headache Increased tears Nausea Pain, redness, or swelling with sores inside the mouth or throat Sensitivity to light Vomiting This list may not describe all possible side effects. Call your doctor for medical advice about side effects. You may report side effects to FDA at 1-800-FDA-1088. Where should I keep my medication? This medication is given in a hospital or clinic. It will not  be stored at home. NOTE: This sheet is a summary. It may not cover all possible information. If you have questions about this medicine, talk to your doctor, pharmacist, or health care provider.  2024 Elsevier/Gold Standard (2021-07-23 00:00:00)  The chemotherapy medication bag should finish at 46 hours, 96 hours, or 7 days. For example, if your pump is scheduled for 46 hours and it was put on at 4:00 p.m., it should finish at 2:00 p.m. the day it is scheduled to come off regardless of your appointment time.     Estimated time to finish at 12:30 p.m. on Thursday 09/18/2022.   If the display on your pump reads "Low Volume" and it is beeping, take the batteries out of the pump and come to the cancer center for it to be taken off.   If the pump alarms go off prior to the pump reading "Low Volume" then call [phone] and someone can assist you.  If the plunger comes out and the chemotherapy medication is leaking out, please use your home chemo spill kit to clean up the spill. Do NOT use paper towels or  other household products.  If you have problems or questions regarding your pump, please call either [phone] (24 hours a day) or the cancer center Monday-Friday 8:00 a.m.- 4:30 p.m. at the clinic number and we will assist you. If you are unable to get assistance, then go to the nearest Emergency Department and ask the staff to contact the IV team for assistance.

## 2022-09-16 NOTE — Progress Notes (Signed)
[name] OFFICE PROGRESS NOTE   Diagnosis:  Colon cancer  INTERVAL HISTORY:   [name] returns as scheduled.  He completed another cycle of FOLFIRI 09/01/2022.  Bevacizumab was held.  Similar nausea as with previous cycles.  No sores within the mouth, he did develop to lip sores which felt like a "cold sore".  Now resolved.  No diarrhea.  He had mild bone pain for about 2 days.  No bleeding.  Main complaint is fatigue.  Objective:  Vital signs in last 24 hours:  Blood pressure 137/86, pulse 79, temperature 98.1 F (36.7 C), temperature source Oral, resp. rate 18, height 6' (1.829 m), weight 216 lb 9.6 oz (98.2 kg), SpO2 100 %.    HEENT: No thrush or ulcers. Resp: Lungs clear bilaterally. Cardio: Regular rate and rhythm. GI: No hepatosplenomegaly. Vascular: Trace edema lower leg bilaterally left slightly greater than right. Port-A-Cath without erythema.  Lab Results:  Lab Results  Component Value Date   WBC 8.9 09/16/2022   HGB 12.5 (L) 09/16/2022   HCT 37.1 (L) 09/16/2022   MCV 102.8 (H) 09/16/2022   PLT 93 (L) 09/16/2022   NEUTROABS 6.7 09/16/2022    Imaging:  No results found.  Medications: I have reviewed the patient's current medications.  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, status post a left colectomy 03/04/2019 Mass felt to be in the transverse colon on a colonoscopy 01/07/2019 with a biopsy confirming poorly differentiated adenocarcinoma with signet ring cell features, mismatch repair protein expression normal CT abdomen/pelvis 01/08/2019 12.3 cm indeterminate splenic mass, present in 2015 on a lumbar MRI-felt to be benign, small subpleural and pleural-based nodules at the lung bases, status post left nephrectomy CT chest 01/20/2019-small bilateral pulmonary nodules with rounded parenchymal nodules in the right lower lobe,  indeterminate splenic mass CT chest 03/18/2019-multiple subcentimeter pulmonary nodules again seen bilaterally, greatest in the lower lobes, without significant change.  No new or enlarging pulmonary nodules or masses.  Partially visualized large heterogeneous splenic mass with no significant change.  Plan for follow-up chest CT at a 109-month interval. Cycle 1 FOLFOX 03/21/2019 Cycle 2 FOLFOX 04/04/2019 Cycle 3 FOLFOX 04/18/2019 (oxaliplatin dose reduced secondary to thrombocytopenia) Cycle 4 FOLFOX 05/02/2019 (oxaliplatin held secondary to thrombocytopenia) Cycle 5 FOLFOX 05/16/2019 Cycle 6 FOLFOX 05/30/2019 (oxaliplatin held secondary to thrombocytopenia) CT chest 06/08/2019-stable scattered small solid pulmonary nodules  Cycle 7 FOLFOX 06/13/2019 Cycle 8 FOLFOX 06/27/2019 Cycle 9 FOLFOX 07/11/2019 Cycle 10 FOLFOX 07/25/2019 (oxaliplatin held due to thrombocytopenia) Cycle 11 FOLFOX 08/08/2019 Cycle 12 FOLFOX 08/22/2019 CTs 01/09/2020-no evidence of recurrent disease, stable lung nodules favored to represent subpleural lymph nodes-dedicated follow-up not recommended CTs 01/11/2021-no evidence of recurrent disease CTs 01/24/2022-no evidence of recurrent disease CT lumbar spine, abdomen/pelvis 04/11/2022-numerous mixed lytic and sclerotic lesions scattered throughout the visualized axial and appendicular skeleton including several areas in the lumbar spine.   04/21/2022 CT biopsy sclerotic lesion right anterior iliac bone-consistent with metastatic colorectal adenocarcinoma, moderate to poorly differentiated.  Foundation 1-microsatellite stable, tumor mutation burden 4, K-ras G12S PET scan 05/09/2022-widespread hypermetabolic mixed faintly lytic and sclerotic osseous metastases throughout the axial and proximal appendicular skeleton; associated mild pathologic compression fracture at L3; no hypermetabolic extraosseous metastatic disease. Cycle 1 FOLFIRI/bevacizumab 05/26/2022 Cycle 2 FOLFIRI/bevacizumab  06/09/2022 Cycle 3 FOLFIRI 06/24/2022, bevacizumab 06/26/2022 Cycle 4 FOLFIRI 07/08/2022, bevacizumab held due to proteinuria Cycle 5 FOLFIRI 07/22/2022, bevacizumab held due to proteinuria CTs 07/31/2022-potentially areas of sclerosis  represent interval healing; new lytic lesion at T12, multiple new areas of punched-out sclerosis throughout the visualized osseous structures.  Index lesion in the left iliac wing measures 1.3 x 2.8 cm.  Healing of previously metabolically occult metastatic disease on 05/09/2022 is a possibility. 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  Left nephrectomy at age 47 12.  Port-A-Cath placement, Dr. Michaell Cowing, 03/16/2019 13.  Thrombocytopenia secondary to chemotherapy-oxaliplatin dose reduced beginning with cycle 3 FOLFOX, improved 14.  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  Right groin soft tissue infection May 2023-status postsurgical debridement 18.  Left leg DVT-common femoral, femoral, popliteal, posterior tibial, and peroneal  veins 19.  02/04/2022-MRI cervical spine-1.7 cm T1 lesion-indeterminate; bone scan 03/14/2022-focal intense activity at approximate T1 level felt to correspond to the lesion on MRI, additional foci of activity involving the lower thoracic and lumbar spine, right iliac bone and pubic symphysis.  CT lumbar spine, abdomen/pelvis 04/11/2022-numerous mixed lytic and sclerotic lesions scattered throughout the visualized axial and appendicular skeleton including several areas in the lumbar spine.  04/21/2022 CT biopsy sclerotic lesion right anterior iliac bone-consistent with metastatic colorectal adenocarcinoma, moderate to poorly differentiated 20.  Inflamed sebaceous cyst 08/18/2022-doxycycline    Disposition: [name] appears stable.  He has completed 8 cycles of FOLFIRI.  Bevacizumab has been on hold due to proteinuria.  He continues to have an elevated protein level in urine.  Plan to proceed with cycle 9 FOLFIRI today as scheduled, continue to hold bevacizumab.  Restaging CTs after cycle 10.  CBC and chemistry panel reviewed.  Labs adequate to proceed as above.  He has mild thrombocytopenia.  He will contact the office with bleeding.  CEA is lower in the normal range.  He will return for lab, follow-up, cycle 10 FOLFOX in 2 weeks.  We are available to see him sooner if needed.      Lonna Cobb ANP/GNP-BC   09/16/2022  10:31 AM

## 2022-09-16 NOTE — Progress Notes (Signed)
Patient seen by Lonna Cobb NP today  Vitals are within treatment parameters.  Labs reviewed by Lonna Cobb NP and are not all within treatment parameters. PLTS 93  Per physician team, patient is ready for treatment. Please note that modifications are being made to the treatment plan including holding  Bevacizumab

## 2022-09-17 ENCOUNTER — Inpatient Hospital Stay: Payer: BC Managed Care – PPO

## 2022-09-18 ENCOUNTER — Inpatient Hospital Stay: Payer: BC Managed Care – PPO

## 2022-09-18 VITALS — BP 113/84 | HR 84 | Temp 98.7°F | Resp 18

## 2022-09-18 DIAGNOSIS — Z5189 Encounter for other specified aftercare: Secondary | ICD-10-CM | POA: Diagnosis not present

## 2022-09-18 DIAGNOSIS — C186 Malignant neoplasm of descending colon: Secondary | ICD-10-CM | POA: Diagnosis not present

## 2022-09-18 DIAGNOSIS — Z5111 Encounter for antineoplastic chemotherapy: Secondary | ICD-10-CM | POA: Diagnosis not present

## 2022-09-18 DIAGNOSIS — C7951 Secondary malignant neoplasm of bone: Secondary | ICD-10-CM | POA: Diagnosis not present

## 2022-09-18 MED ORDER — PEGFILGRASTIM-CBQV 6 MG/0.6ML ~~LOC~~ SOSY
6.0000 mg | PREFILLED_SYRINGE | Freq: Once | SUBCUTANEOUS | Status: AC
  Administered 2022-09-18: 6 mg via SUBCUTANEOUS
  Filled 2022-09-18: qty 0.6

## 2022-09-18 MED ORDER — HEPARIN SOD (PORK) LOCK FLUSH 100 UNIT/ML IV SOLN
500.0000 [IU] | Freq: Once | INTRAVENOUS | Status: AC | PRN
  Administered 2022-09-18: 500 [IU]

## 2022-09-18 MED ORDER — SODIUM CHLORIDE 0.9% FLUSH
10.0000 mL | INTRAVENOUS | Status: DC | PRN
  Administered 2022-09-18: 10 mL

## 2022-09-18 NOTE — Patient Instructions (Signed)

## 2022-09-19 ENCOUNTER — Other Ambulatory Visit: Payer: Self-pay

## 2022-09-24 DIAGNOSIS — C186 Malignant neoplasm of descending colon: Secondary | ICD-10-CM | POA: Diagnosis not present

## 2022-09-25 ENCOUNTER — Inpatient Hospital Stay: Payer: BC Managed Care – PPO | Admitting: Nurse Practitioner

## 2022-09-25 ENCOUNTER — Encounter: Payer: Self-pay | Admitting: Nurse Practitioner

## 2022-09-25 ENCOUNTER — Inpatient Hospital Stay: Payer: BC Managed Care – PPO

## 2022-09-25 ENCOUNTER — Ambulatory Visit (HOSPITAL_BASED_OUTPATIENT_CLINIC_OR_DEPARTMENT_OTHER)
Admission: RE | Admit: 2022-09-25 | Discharge: 2022-09-25 | Disposition: A | Discharge status: Home / Self Care | Payer: BC Managed Care – PPO | Source: Ambulatory Visit | Attending: Nurse Practitioner | Admitting: Nurse Practitioner

## 2022-09-25 ENCOUNTER — Other Ambulatory Visit: Payer: Self-pay

## 2022-09-25 VITALS — BP 148/87 | HR 87 | Temp 98.2°F | Resp 18 | Ht 72.0 in | Wt 213.9 lb

## 2022-09-25 DIAGNOSIS — C7951 Secondary malignant neoplasm of bone: Secondary | ICD-10-CM

## 2022-09-25 DIAGNOSIS — R0600 Dyspnea, unspecified: Secondary | ICD-10-CM | POA: Diagnosis not present

## 2022-09-25 DIAGNOSIS — C186 Malignant neoplasm of descending colon: Secondary | ICD-10-CM

## 2022-09-25 DIAGNOSIS — C189 Malignant neoplasm of colon, unspecified: Secondary | ICD-10-CM

## 2022-09-25 DIAGNOSIS — R509 Fever, unspecified: Secondary | ICD-10-CM | POA: Diagnosis not present

## 2022-09-25 LAB — CBC WITH DIFFERENTIAL (CANCER CENTER ONLY)
Abs Immature Granulocytes: 0.12 10*3/uL — ABNORMAL HIGH (ref 0.00–0.07)
Basophils Absolute: 0 10*3/uL (ref 0.0–0.1)
Basophils Relative: 0 %
Eosinophils Absolute: 0.2 10*3/uL (ref 0.0–0.5)
Eosinophils Relative: 2 %
HCT: 32.6 % — ABNORMAL LOW (ref 39.0–52.0)
Hemoglobin: 11 g/dL — ABNORMAL LOW (ref 13.0–17.0)
Immature Granulocytes: 1 %
Lymphocytes Relative: 7 %
Lymphs Abs: 0.7 10*3/uL (ref 0.7–4.0)
MCH: 34.5 pg — ABNORMAL HIGH (ref 26.0–34.0)
MCHC: 33.7 g/dL (ref 30.0–36.0)
MCV: 102.2 fL — ABNORMAL HIGH (ref 80.0–100.0)
Monocytes Absolute: 1 10*3/uL (ref 0.1–1.0)
Monocytes Relative: 11 %
Neutro Abs: 7.3 10*3/uL (ref 1.7–7.7)
Neutrophils Relative %: 79 %
Platelet Count: 127 10*3/uL — ABNORMAL LOW (ref 150–400)
RBC: 3.19 MIL/uL — ABNORMAL LOW (ref 4.22–5.81)
RDW: 14.6 % (ref 11.5–15.5)
WBC Count: 9.4 10*3/uL (ref 4.0–10.5)
nRBC: 0 % (ref 0.0–0.2)

## 2022-09-25 LAB — RESP PANEL BY RT-PCR (RSV, FLU A&B, COVID)  RVPGX2
Influenza A by PCR: NEGATIVE
Influenza B by PCR: NEGATIVE
Resp Syncytial Virus by PCR: NEGATIVE
SARS Coronavirus 2 by RT PCR: NEGATIVE

## 2022-09-25 NOTE — Progress Notes (Signed)
[facility] OFFICE PROGRESS NOTE   Diagnosis: Colon cancer  INTERVAL HISTORY:   [name] presented to the office this morning requesting an appointment for evaluation of a fever.  He completed cycle 9 FOLFIRI 09/16/2022.  He has been traveling.  4 to 5 days ago he had shaking chills.  Last night he woke up multiple times sweating.  He felt like he had a fever.  He was at a hotel so he did not have access to a thermometer.  He took Tylenol around 630 this morning.  He notes dyspnea on exertion and tachycardia on exertion.  No change in baseline cough.  He does not feel short of breath.  No upper respiratory symptoms.  No arthralgias/myalgias.  He reports blood sugar recently was greater than 400.  Objective:  Vital signs in last 24 hours:  Blood pressure (!) 148/87, pulse 87, temperature 98.2 F (36.8 C), temperature source Oral, resp. rate 18, height 6' (1.829 m), weight 213 lb 14.4 oz (97 kg), SpO2 99 %.    Resp: Scattered wheezes.  No respiratory distress. Cardio: Regular rate and rhythm. GI: No hepatomegaly. Vascular: Trace edema lower leg bilaterally left slightly greater than right. Neuro: Alert and oriented. Skin: Warm. Port-A-Cath without erythema.  Lab Results:  Lab Results  Component Value Date   WBC 9.4 09/25/2022   HGB 11.0 (L) 09/25/2022   HCT 32.6 (L) 09/25/2022   MCV 102.2 (H) 09/25/2022   PLT 127 (L) 09/25/2022   NEUTROABS 7.3 09/25/2022    Imaging:  DG Chest 2 View  Result Date: 09/25/2022 CLINICAL DATA:  Metastatic colon cancer. Fever, chills, dyspnea. Tachycardia. EXAM: CHEST - 2 VIEW COMPARISON:  Chest radiographs 12/29/2021 and 06/11/2020; CT chest, abdomen, and pelvis 07/31/2022 FINDINGS: Right chest wall port a catheter tip again overlies the central superior vena cava. Cardiac silhouette and mediastinal contours are within normal limits. Mitral valve prosthesis is again noted. Mediastinal contours within normal limits. Heart size is  normal. Linear right upper lung scarring is similar to prior radiographs and 07/31/2022 CT. No new acute airspace opacity is identified. No pleural effusion pneumothorax. No acute skeletal abnormality. Moderate dextrocurvature of the upper thoracic spine. Scattered sclerotic bone metastases are again noted, better seen on prior CT. IMPRESSION: 1. No acute pulmonary process. 2. Linear right upper lung scarring is similar to prior radiographs and 07/31/2022 CT. Electronically Signed   By: Neita Garnet M.D.   On: 09/25/2022 11:01    Medications: I have reviewed the patient's current medications.  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, status post a left colectomy 03/04/2019 Mass felt to be in the transverse colon on a colonoscopy 01/07/2019 with a biopsy confirming poorly differentiated adenocarcinoma with signet ring cell features, mismatch repair protein expression normal CT abdomen/pelvis 01/08/2019 12.3 cm indeterminate splenic mass, present in 2015 on a lumbar MRI-felt to be benign, small subpleural and pleural-based nodules at the lung bases, status post left nephrectomy CT chest 01/20/2019-small bilateral pulmonary nodules with rounded parenchymal nodules in the right lower lobe, indeterminate splenic mass CT chest 03/18/2019-multiple subcentimeter pulmonary nodules again seen bilaterally, greatest in the lower lobes, without significant change.  No new or enlarging pulmonary nodules or masses.  Partially visualized large heterogeneous splenic mass with no significant change.  Plan for follow-up chest CT at a 80-month interval. Cycle 1 FOLFOX 03/21/2019 Cycle 2 FOLFOX 04/04/2019 Cycle 3 FOLFOX 04/18/2019 (oxaliplatin dose reduced secondary to  thrombocytopenia) Cycle 4 FOLFOX 05/02/2019 (oxaliplatin held secondary to thrombocytopenia) Cycle 5 FOLFOX 05/16/2019 Cycle 6 FOLFOX 05/30/2019  (oxaliplatin held secondary to thrombocytopenia) CT chest 06/08/2019-stable scattered small solid pulmonary nodules  Cycle 7 FOLFOX 06/13/2019 Cycle 8 FOLFOX 06/27/2019 Cycle 9 FOLFOX 07/11/2019 Cycle 10 FOLFOX 07/25/2019 (oxaliplatin held due to thrombocytopenia) Cycle 11 FOLFOX 08/08/2019 Cycle 12 FOLFOX 08/22/2019 CTs 01/09/2020-no evidence of recurrent disease, stable lung nodules favored to represent subpleural lymph nodes-dedicated follow-up not recommended CTs 01/11/2021-no evidence of recurrent disease CTs 01/24/2022-no evidence of recurrent disease CT lumbar spine, abdomen/pelvis 04/11/2022-numerous mixed lytic and sclerotic lesions scattered throughout the visualized axial and appendicular skeleton including several areas in the lumbar spine.   04/21/2022 CT biopsy sclerotic lesion right anterior iliac bone-consistent with metastatic colorectal adenocarcinoma, moderate to poorly differentiated.  Foundation 1-microsatellite stable, tumor mutation burden 4, K-ras G12S PET scan 05/09/2022-widespread hypermetabolic mixed faintly lytic and sclerotic osseous metastases throughout the axial and proximal appendicular skeleton; associated mild pathologic compression fracture at L3; no hypermetabolic extraosseous metastatic disease. Cycle 1 FOLFIRI/bevacizumab 05/26/2022 Cycle 2 FOLFIRI/bevacizumab 06/09/2022 Cycle 3 FOLFIRI 06/24/2022, bevacizumab 06/26/2022 Cycle 4 FOLFIRI 07/08/2022, bevacizumab held due to proteinuria Cycle 5 FOLFIRI 07/22/2022, bevacizumab held due to proteinuria CTs 07/31/2022-potentially areas of sclerosis represent interval healing; new lytic lesion at T12, multiple new areas of punched-out sclerosis throughout the visualized osseous structures.  Index lesion in the left iliac wing measures 1.3 x 2.8 cm.  Healing of previously metabolically occult metastatic disease on 05/09/2022 is a possibility. Cycle 6 FOLFIRI 08/04/2022, irinotecan dose reduced due to thrombocytopenia, bevacizumab held  pending 24-hour urine result Cycle 7 FOLFIRI 08/18/2022, bevacizumab held secondary to proteinuria, G-CSF held Cycle 8 FOLFIRI 09/01/2022, bevacizumab held, G-CSF Cycle 9 FOLFIRI 09/16/2022, bevacizumab held, G-CSF   Multiple colon polyps-ascending colon polyps on the colonoscopy 01/07/2019-not removed Colonoscopy 01/03/2020-multiple polyps removed, tubular adenomas, inflammatory and hyperplastic polyps Wilms tumor at age 53, 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  Left nephrectomy at age 89 12.  Port-A-Cath placement, Dr. Michaell Cowing, 03/16/2019 13.  Thrombocytopenia secondary to chemotherapy-oxaliplatin dose reduced beginning with cycle 3 FOLFOX, improved 14.  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  Right groin soft tissue infection May 2023-status postsurgical debridement 18.  Left leg DVT-common femoral, femoral, popliteal, posterior tibial, and peroneal veins 19.  02/04/2022-MRI cervical spine-1.7 cm T1 lesion-indeterminate; bone scan 03/14/2022-focal intense activity at approximate T1 level felt to correspond to the lesion on MRI, additional foci of activity involving the lower thoracic and lumbar spine, right iliac bone and pubic symphysis.  CT lumbar spine, abdomen/pelvis 04/11/2022-numerous mixed lytic and sclerotic lesions scattered throughout the visualized axial and appendicular skeleton including several areas in the lumbar spine.  04/21/2022 CT biopsy sclerotic lesion right anterior iliac bone-consistent with metastatic colorectal adenocarcinoma, moderate to  poorly differentiated 20.  Inflamed sebaceous cyst 08/18/2022-doxycycline    Disposition: [name] has metastatic colon cancer.  He is on active treatment with FOLFIRI.  Most recent chemotherapy 09/16/2022.  Presents today with chills/sweats/subjective fever.  Chest x-ray is unremarkable.  Respiratory panel negative for RSV, flu and COVID.  He will treat symptomatically and contact the office with fever, chills, other  signs of infection.  Patient seen with [name].    Lonna Cobb ANP/GNP-BC   09/25/2022  11:21 AM  This was a shared visit with Lonna Cobb.  [name] was interviewed and examined.  He presents today with a febrile illness.  A chest x-ray does not reveal pneumonia.  There is no apparent source for infection.  There is no clinical evidence of a Port-A-Cath infection.  He is afebrile today.  We suspect he has a viral infection.  He will call for recurrent fever/chills or new symptoms.  I was present for greater than 50% of today's visit.  I performed medical decision making.  Mancel Bale, MD

## 2022-09-26 ENCOUNTER — Telehealth: Payer: Self-pay

## 2022-09-26 NOTE — Telephone Encounter (Signed)
I contacted the patient to inquire about any updates on their symptoms. The patient reported ongoing fatigue but no fever or chills. They are able to eat and drink, but are still experiencing coughing up mucus and shortness of breath. Overall, the patient is showing signs of recovery with no new concerns. The patient was advised to seek medical attention at the emergency department if their shortness of breath worsens.

## 2022-09-28 ENCOUNTER — Other Ambulatory Visit: Payer: Self-pay | Admitting: Oncology

## 2022-09-29 ENCOUNTER — Encounter (HOSPITAL_BASED_OUTPATIENT_CLINIC_OR_DEPARTMENT_OTHER): Payer: Self-pay

## 2022-09-29 ENCOUNTER — Inpatient Hospital Stay: Payer: BC Managed Care – PPO

## 2022-09-29 ENCOUNTER — Telehealth: Payer: Self-pay | Admitting: Nurse Practitioner

## 2022-09-29 ENCOUNTER — Encounter: Payer: Self-pay | Admitting: Nurse Practitioner

## 2022-09-29 ENCOUNTER — Inpatient Hospital Stay: Payer: BC Managed Care – PPO | Admitting: Nurse Practitioner

## 2022-09-29 ENCOUNTER — Encounter (HOSPITAL_BASED_OUTPATIENT_CLINIC_OR_DEPARTMENT_OTHER): Payer: Self-pay | Admitting: Orthopaedic Surgery

## 2022-09-29 ENCOUNTER — Inpatient Hospital Stay: Payer: BC Managed Care – PPO | Attending: Oncology

## 2022-09-29 ENCOUNTER — Ambulatory Visit (HOSPITAL_BASED_OUTPATIENT_CLINIC_OR_DEPARTMENT_OTHER)
Admission: RE | Admit: 2022-09-29 | Discharge: 2022-09-29 | Disposition: A | Discharge status: Home / Self Care | Payer: BC Managed Care – PPO | Source: Ambulatory Visit | Attending: Nurse Practitioner | Admitting: Nurse Practitioner

## 2022-09-29 VITALS — BP 127/83 | HR 85 | Temp 98.1°F | Resp 18 | Ht 72.0 in | Wt 214.3 lb

## 2022-09-29 DIAGNOSIS — C186 Malignant neoplasm of descending colon: Secondary | ICD-10-CM

## 2022-09-29 DIAGNOSIS — C7951 Secondary malignant neoplasm of bone: Secondary | ICD-10-CM | POA: Insufficient documentation

## 2022-09-29 DIAGNOSIS — Z5189 Encounter for other specified aftercare: Secondary | ICD-10-CM | POA: Insufficient documentation

## 2022-09-29 DIAGNOSIS — C189 Malignant neoplasm of colon, unspecified: Secondary | ICD-10-CM | POA: Diagnosis not present

## 2022-09-29 DIAGNOSIS — R0609 Other forms of dyspnea: Secondary | ICD-10-CM

## 2022-09-29 DIAGNOSIS — I700 Atherosclerosis of aorta: Secondary | ICD-10-CM | POA: Diagnosis not present

## 2022-09-29 DIAGNOSIS — Z5111 Encounter for antineoplastic chemotherapy: Secondary | ICD-10-CM | POA: Insufficient documentation

## 2022-09-29 DIAGNOSIS — Z5112 Encounter for antineoplastic immunotherapy: Secondary | ICD-10-CM | POA: Insufficient documentation

## 2022-09-29 DIAGNOSIS — R161 Splenomegaly, not elsewhere classified: Secondary | ICD-10-CM | POA: Diagnosis not present

## 2022-09-29 DIAGNOSIS — C187 Malignant neoplasm of sigmoid colon: Secondary | ICD-10-CM | POA: Insufficient documentation

## 2022-09-29 LAB — CBC WITH DIFFERENTIAL (CANCER CENTER ONLY)
Abs Immature Granulocytes: 0.61 10*3/uL — ABNORMAL HIGH (ref 0.00–0.07)
Basophils Absolute: 0.1 10*3/uL (ref 0.0–0.1)
Basophils Relative: 1 %
Eosinophils Absolute: 0.3 10*3/uL (ref 0.0–0.5)
Eosinophils Relative: 5 %
HCT: 37 % — ABNORMAL LOW (ref 39.0–52.0)
Hemoglobin: 12.2 g/dL — ABNORMAL LOW (ref 13.0–17.0)
Immature Granulocytes: 10 %
Lymphocytes Relative: 16 %
Lymphs Abs: 0.9 10*3/uL (ref 0.7–4.0)
MCH: 34 pg (ref 26.0–34.0)
MCHC: 33 g/dL (ref 30.0–36.0)
MCV: 103.1 fL — ABNORMAL HIGH (ref 80.0–100.0)
Monocytes Absolute: 0.6 10*3/uL (ref 0.1–1.0)
Monocytes Relative: 9 %
Neutro Abs: 3.6 10*3/uL (ref 1.7–7.7)
Neutrophils Relative %: 59 %
Platelet Count: 145 10*3/uL — ABNORMAL LOW (ref 150–400)
RBC: 3.59 MIL/uL — ABNORMAL LOW (ref 4.22–5.81)
RDW: 14.3 % (ref 11.5–15.5)
WBC Count: 6 10*3/uL (ref 4.0–10.5)
nRBC: 0.3 % — ABNORMAL HIGH (ref 0.0–0.2)

## 2022-09-29 LAB — CMP (CANCER CENTER ONLY)
ALT: 18 U/L (ref 0–44)
AST: 16 U/L (ref 15–41)
Albumin: 3.7 g/dL (ref 3.5–5.0)
Alkaline Phosphatase: 79 U/L (ref 38–126)
Anion gap: 9 (ref 5–15)
BUN: 17 mg/dL (ref 6–20)
CO2: 24 mmol/L (ref 22–32)
Calcium: 8.8 mg/dL — ABNORMAL LOW (ref 8.9–10.3)
Chloride: 102 mmol/L (ref 98–111)
Creatinine: 1.07 mg/dL (ref 0.61–1.24)
GFR, Estimated: >60 mL/min (ref >60)
Glucose, Bld: 212 mg/dL — ABNORMAL HIGH (ref 70–99)
Potassium: 4.4 mmol/L (ref 3.5–5.1)
Sodium: 135 mmol/L (ref 135–145)
Total Bilirubin: 0.6 mg/dL (ref 0.3–1.2)
Total Protein: 6.7 g/dL (ref 6.5–8.1)

## 2022-09-29 LAB — CEA (ACCESS): CEA (CHCC): 1.39 ng/mL (ref 0.00–5.00)

## 2022-09-29 LAB — TOTAL PROTEIN, URINE DIPSTICK: Protein, ur: 100 mg/dL — AB

## 2022-09-29 MED ORDER — IOHEXOL 300 MG/ML  SOLN
100.0000 mL | Freq: Once | INTRAMUSCULAR | Status: AC | PRN
  Administered 2022-09-29: 85 mL via INTRAVENOUS

## 2022-09-29 MED ORDER — LEVOFLOXACIN 500 MG PO TABS
500.0000 mg | ORAL_TABLET | Freq: Every day | ORAL | 0 refills | Status: AC
Start: 2022-09-29 — End: 2022-10-06

## 2022-09-29 MED ORDER — HEPARIN SOD (PORK) LOCK FLUSH 100 UNIT/ML IV SOLN
500.0000 [IU] | Freq: Once | INTRAVENOUS | Status: AC
  Administered 2022-09-29: 500 [IU] via INTRAVENOUS

## 2022-09-29 NOTE — Progress Notes (Addendum)
[facility] OFFICE PROGRESS NOTE   Diagnosis: Colon cancer  INTERVAL HISTORY:   Mr. Bielak returns as scheduled.  He completed cycle 9 FOLFIRI 09/16/2022.  He denies nausea, vomiting, diarrhea.  No chills or fever.  He took another COVID home test which was negative.  He has a mild sore throat.  He continues to have a cough, now "hacking".  He has dyspnea with minimal exertion.  No chest pain.  Objective:  Vital signs in last 24 hours:  Blood pressure 127/83, pulse 85, temperature 98.1 F (36.7 C), resp. rate 18, height 6' (1.829 m), weight 214 lb 4.8 oz (97.2 kg).    HEENT: No thrush or ulcers. Resp: Right lung field with bronchial breath sounds, question rub right lateral chest.  No respiratory distress. Cardio: Regular rate and rhythm. GI: No hepatosplenomegaly. Vascular: Trace edema left lower leg. Neuro: Alert and oriented. Port-A-Cath without erythema.  Lab Results:  Lab Results  Component Value Date   WBC 6.0 09/29/2022   HGB 12.2 (L) 09/29/2022   HCT 37.0 (L) 09/29/2022   MCV 103.1 (H) 09/29/2022   PLT 145 (L) 09/29/2022   NEUTROABS PENDING 09/29/2022    Imaging:  No results found.  Medications: I have reviewed the patient's current medications.  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, status post a left colectomy 03/04/2019 Mass felt to be in the transverse colon on a colonoscopy 01/07/2019 with a biopsy confirming poorly differentiated adenocarcinoma with signet ring cell features, mismatch repair protein expression normal CT abdomen/pelvis 01/08/2019 12.3 cm indeterminate splenic mass, present in 2015 on a lumbar MRI-felt to be benign, small subpleural and pleural-based nodules at the lung bases, status post left nephrectomy CT chest 01/20/2019-small bilateral pulmonary nodules with rounded parenchymal nodules in the right lower  lobe, indeterminate splenic mass CT chest 03/18/2019-multiple subcentimeter pulmonary nodules again seen bilaterally, greatest in the lower lobes, without significant change.  No new or enlarging pulmonary nodules or masses.  Partially visualized large heterogeneous splenic mass with no significant change.  Plan for follow-up chest CT at a 38-month interval. Cycle 1 FOLFOX 03/21/2019 Cycle 2 FOLFOX 04/04/2019 Cycle 3 FOLFOX 04/18/2019 (oxaliplatin dose reduced secondary to thrombocytopenia) Cycle 4 FOLFOX 05/02/2019 (oxaliplatin held secondary to thrombocytopenia) Cycle 5 FOLFOX 05/16/2019 Cycle 6 FOLFOX 05/30/2019 (oxaliplatin held secondary to thrombocytopenia) CT chest 06/08/2019-stable scattered small solid pulmonary nodules  Cycle 7 FOLFOX 06/13/2019 Cycle 8 FOLFOX 06/27/2019 Cycle 9 FOLFOX 07/11/2019 Cycle 10 FOLFOX 07/25/2019 (oxaliplatin held due to thrombocytopenia) Cycle 11 FOLFOX 08/08/2019 Cycle 12 FOLFOX 08/22/2019 CTs 01/09/2020-no evidence of recurrent disease, stable lung nodules favored to represent subpleural lymph nodes-dedicated follow-up not recommended CTs 01/11/2021-no evidence of recurrent disease CTs 01/24/2022-no evidence of recurrent disease CT lumbar spine, abdomen/pelvis 04/11/2022-numerous mixed lytic and sclerotic lesions scattered throughout the visualized axial and appendicular skeleton including several areas in the lumbar spine.   04/21/2022 CT biopsy sclerotic lesion right anterior iliac bone-consistent with metastatic colorectal adenocarcinoma, moderate to poorly differentiated.  Foundation 1-microsatellite stable, tumor mutation burden 4, K-ras G12S PET scan 05/09/2022-widespread hypermetabolic mixed faintly lytic and sclerotic osseous metastases throughout the axial and proximal appendicular skeleton; associated mild pathologic compression fracture at L3; no hypermetabolic extraosseous metastatic disease. Cycle 1 FOLFIRI/bevacizumab 05/26/2022 Cycle 2 FOLFIRI/bevacizumab  06/09/2022 Cycle 3 FOLFIRI 06/24/2022, bevacizumab 06/26/2022 Cycle 4 FOLFIRI 07/08/2022, bevacizumab held due to proteinuria Cycle 5 FOLFIRI 07/22/2022, bevacizumab held due to proteinuria CTs 07/31/2022-potentially areas of sclerosis represent  interval healing; new lytic lesion at T12, multiple new areas of punched-out sclerosis throughout the visualized osseous structures.  Index lesion in the left iliac wing measures 1.3 x 2.8 cm.  Healing of previously metabolically occult metastatic disease on 05/09/2022 is a possibility. Cycle 6 FOLFIRI 08/04/2022, irinotecan dose reduced due to thrombocytopenia, bevacizumab held pending 24-hour urine result Cycle 7 FOLFIRI 08/18/2022, bevacizumab held secondary to proteinuria, G-CSF held Cycle 8 FOLFIRI 09/01/2022, bevacizumab held, G-CSF Cycle 9 FOLFIRI 09/16/2022, bevacizumab held, G-CSF   Multiple colon polyps-ascending colon polyps on the colonoscopy 01/07/2019-not removed Colonoscopy 01/03/2020-multiple polyps removed, tubular adenomas, inflammatory and hyperplastic polyps Wilms tumor at age 57, 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  Left nephrectomy at age 83 12.  Port-A-Cath placement, Dr. Michaell Cowing, 03/16/2019 13.  Thrombocytopenia secondary to chemotherapy-oxaliplatin dose reduced beginning with cycle 3 FOLFOX, improved 14.  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  Right groin soft tissue infection May 2023-status postsurgical debridement 18.  Left leg DVT-common femoral, femoral, popliteal, posterior tibial, and peroneal  veins 19.  02/04/2022-MRI cervical spine-1.7 cm T1 lesion-indeterminate; bone scan 03/14/2022-focal intense activity at approximate T1 level felt to correspond to the lesion on MRI, additional foci of activity involving the lower thoracic and lumbar spine, right iliac bone and pubic symphysis.  CT lumbar spine, abdomen/pelvis 04/11/2022-numerous mixed lytic and sclerotic lesions scattered throughout the visualized axial and appendicular skeleton including several areas in the lumbar spine.  04/21/2022 CT biopsy sclerotic lesion right anterior iliac bone-consistent with metastatic colorectal adenocarcinoma, moderate to poorly differentiated 20.  Inflamed sebaceous cyst 08/18/2022-doxycycline  Disposition: [name] has completed 9 cycles of FOLFIRI.  He was seen last week with subjective fever, shaking chills, dyspnea on exertion.  Respiratory panel returned negative.  Chest x-ray was negative.  No further fever or chills.  He has persistent dyspnea with minimal exertion and a cough.  We are referring him for stat CTs to evaluate the dyspnea and cough as well as restage cancer.  He agrees with this plan.  We will hold treatment today with follow-up appointments to be scheduled pending CT results.  Plan reviewed with [name].    Lonna Cobb ANP/GNP-BC   09/29/2022  8:36 AM  Addendum 3:15 PM-I reviewed the CT report with Mr. Kyger.  [name] has reviewed the images.  With regard to cancer findings appear stable.  We are rescheduling chemotherapy for next week.  There are some inflammatory/infectious changes in the right lung.  He will begin Levaquin 500 mg daily for 7 days.  I have reviewed potential side effects with him including C. difficile colitis, neuropathy, tendon rupture.  He agrees with this plan.

## 2022-09-29 NOTE — Telephone Encounter (Signed)
I contacted [name] with the CT report.  [name] has reviewed the images.  There are inflammatory/infectious changes in the right lung.  He will begin Levaquin 500 mg daily.  With regard to cancer the scan overall appears stable.  We are canceling chemotherapy this week and will reschedule to next week.  [name] agrees with this plan.  He understands to contact the office prior to his next visit with any decline in his respiratory status.

## 2022-09-30 ENCOUNTER — Telehealth: Payer: Self-pay | Admitting: Oncology

## 2022-10-01 ENCOUNTER — Inpatient Hospital Stay: Payer: BC Managed Care – PPO

## 2022-10-01 ENCOUNTER — Other Ambulatory Visit: Payer: Self-pay

## 2022-10-05 ENCOUNTER — Other Ambulatory Visit: Payer: Self-pay | Admitting: Oncology

## 2022-10-08 ENCOUNTER — Encounter: Payer: Self-pay | Admitting: Nurse Practitioner

## 2022-10-08 ENCOUNTER — Inpatient Hospital Stay: Payer: BC Managed Care – PPO

## 2022-10-08 ENCOUNTER — Other Ambulatory Visit: Payer: BC Managed Care – PPO

## 2022-10-08 ENCOUNTER — Inpatient Hospital Stay: Payer: BC Managed Care – PPO | Admitting: Nurse Practitioner

## 2022-10-08 ENCOUNTER — Ambulatory Visit: Payer: BC Managed Care – PPO

## 2022-10-08 VITALS — BP 123/76 | HR 76 | Temp 98.1°F | Resp 18

## 2022-10-08 VITALS — BP 128/84 | HR 78 | Temp 98.0°F | Resp 18 | Wt 215.7 lb

## 2022-10-08 DIAGNOSIS — C186 Malignant neoplasm of descending colon: Secondary | ICD-10-CM

## 2022-10-08 DIAGNOSIS — Z95828 Presence of other vascular implants and grafts: Secondary | ICD-10-CM

## 2022-10-08 DIAGNOSIS — C7951 Secondary malignant neoplasm of bone: Secondary | ICD-10-CM | POA: Diagnosis not present

## 2022-10-08 DIAGNOSIS — C187 Malignant neoplasm of sigmoid colon: Secondary | ICD-10-CM | POA: Diagnosis not present

## 2022-10-08 DIAGNOSIS — Z5111 Encounter for antineoplastic chemotherapy: Secondary | ICD-10-CM | POA: Diagnosis not present

## 2022-10-08 DIAGNOSIS — Z5112 Encounter for antineoplastic immunotherapy: Secondary | ICD-10-CM | POA: Diagnosis not present

## 2022-10-08 DIAGNOSIS — Z5189 Encounter for other specified aftercare: Secondary | ICD-10-CM | POA: Diagnosis not present

## 2022-10-08 LAB — CEA (ACCESS): CEA (CHCC): 1.59 ng/mL (ref 0.00–5.00)

## 2022-10-08 LAB — CMP (CANCER CENTER ONLY)
ALT: 15 U/L (ref 0–44)
AST: 17 U/L (ref 15–41)
Albumin: 4 g/dL (ref 3.5–5.0)
Alkaline Phosphatase: 65 U/L (ref 38–126)
Anion gap: 8 (ref 5–15)
BUN: 21 mg/dL — ABNORMAL HIGH (ref 6–20)
CO2: 26 mmol/L (ref 22–32)
Calcium: 9.5 mg/dL (ref 8.9–10.3)
Chloride: 100 mmol/L (ref 98–111)
Creatinine: 0.96 mg/dL (ref 0.61–1.24)
GFR, Estimated: >60 mL/min (ref >60)
Glucose, Bld: 172 mg/dL — ABNORMAL HIGH (ref 70–99)
Potassium: 4.4 mmol/L (ref 3.5–5.1)
Sodium: 134 mmol/L — ABNORMAL LOW (ref 135–145)
Total Bilirubin: 0.7 mg/dL (ref 0.3–1.2)
Total Protein: 6.6 g/dL (ref 6.5–8.1)

## 2022-10-08 LAB — CBC WITH DIFFERENTIAL (CANCER CENTER ONLY)
Abs Immature Granulocytes: 0.07 10*3/uL (ref 0.00–0.07)
Basophils Absolute: 0 10*3/uL (ref 0.0–0.1)
Basophils Relative: 1 %
Eosinophils Absolute: 0.3 10*3/uL (ref 0.0–0.5)
Eosinophils Relative: 4 %
HCT: 38.4 % — ABNORMAL LOW (ref 39.0–52.0)
Hemoglobin: 12.8 g/dL — ABNORMAL LOW (ref 13.0–17.0)
Immature Granulocytes: 1 %
Lymphocytes Relative: 13 %
Lymphs Abs: 1 10*3/uL (ref 0.7–4.0)
MCH: 34.4 pg — ABNORMAL HIGH (ref 26.0–34.0)
MCHC: 33.3 g/dL (ref 30.0–36.0)
MCV: 103.2 fL — ABNORMAL HIGH (ref 80.0–100.0)
Monocytes Absolute: 0.8 10*3/uL (ref 0.1–1.0)
Monocytes Relative: 10 %
Neutro Abs: 5.3 10*3/uL (ref 1.7–7.7)
Neutrophils Relative %: 71 %
Platelet Count: 164 10*3/uL (ref 150–400)
RBC: 3.72 MIL/uL — ABNORMAL LOW (ref 4.22–5.81)
RDW: 14.4 % (ref 11.5–15.5)
WBC Count: 7.4 10*3/uL (ref 4.0–10.5)
nRBC: 0 % (ref 0.0–0.2)

## 2022-10-08 LAB — TOTAL PROTEIN, URINE DIPSTICK: Protein, ur: 30 mg/dL — AB

## 2022-10-08 MED ORDER — SODIUM CHLORIDE 0.9 % IV SOLN
400.0000 mg/m2 | Freq: Once | INTRAVENOUS | Status: AC
  Administered 2022-10-08: 892 mg via INTRAVENOUS
  Filled 2022-10-08: qty 44.6

## 2022-10-08 MED ORDER — SODIUM CHLORIDE 0.9% FLUSH
10.0000 mL | INTRAVENOUS | Status: DC | PRN
  Administered 2022-10-08: 10 mL

## 2022-10-08 MED ORDER — FLUOROURACIL CHEMO INJECTION 2.5 GM/50ML
400.0000 mg/m2 | Freq: Once | INTRAVENOUS | Status: AC
  Administered 2022-10-08: 900 mg via INTRAVENOUS
  Filled 2022-10-08: qty 18

## 2022-10-08 MED ORDER — SODIUM CHLORIDE 0.9 % IV SOLN
2400.0000 mg/m2 | INTRAVENOUS | Status: DC
  Administered 2022-10-08: 5000 mg via INTRAVENOUS
  Filled 2022-10-08: qty 100

## 2022-10-08 MED ORDER — ATROPINE SULFATE 1 MG/ML IV SOLN
0.5000 mg | Freq: Once | INTRAVENOUS | Status: DC | PRN
  Filled 2022-10-08: qty 1

## 2022-10-08 MED ORDER — SODIUM CHLORIDE 0.9 % IV SOLN
10.0000 mg | Freq: Once | INTRAVENOUS | Status: AC
  Administered 2022-10-08: 10 mg via INTRAVENOUS
  Filled 2022-10-08: qty 10

## 2022-10-08 MED ORDER — ALTEPLASE 2 MG IJ SOLR: 2.0000 mg | Freq: Once | INTRAMUSCULAR | Status: DC | PRN

## 2022-10-08 MED ORDER — SODIUM CHLORIDE 0.9 % IV SOLN
5.0000 mg/kg | Freq: Once | INTRAVENOUS | Status: AC
  Administered 2022-10-08: 500 mg via INTRAVENOUS
  Filled 2022-10-08: qty 16

## 2022-10-08 MED ORDER — HEPARIN SOD (PORK) LOCK FLUSH 100 UNIT/ML IV SOLN: 500.0000 [IU] | Freq: Once | INTRAVENOUS | Status: DC | PRN

## 2022-10-08 MED ORDER — ZOLEDRONIC ACID 4 MG/100ML IV SOLN: 4.0000 mg | Freq: Once | INTRAVENOUS | Status: DC

## 2022-10-08 MED ORDER — PALONOSETRON HCL INJECTION 0.25 MG/5ML
0.2500 mg | Freq: Once | INTRAVENOUS | Status: AC
  Administered 2022-10-08: 0.25 mg via INTRAVENOUS
  Filled 2022-10-08: qty 5

## 2022-10-08 MED ORDER — SODIUM CHLORIDE 0.9% FLUSH: 10.0000 mL | INTRAVENOUS | Status: DC | PRN

## 2022-10-08 MED ORDER — SODIUM CHLORIDE 0.9 % IV SOLN: Freq: Once | INTRAVENOUS | Status: AC

## 2022-10-08 MED ORDER — SODIUM CHLORIDE 0.9 % IV SOLN
140.0000 mg/m2 | Freq: Once | INTRAVENOUS | Status: AC
  Administered 2022-10-08: 300 mg via INTRAVENOUS
  Filled 2022-10-08: qty 15

## 2022-10-08 NOTE — Progress Notes (Signed)
Patient seen by Lonna Cobb NP today  Vitals are within treatment parameters.  Labs reviewed by Lonna Cobb NP and are within treatment parameters. Protein 30  Per physician team, patient is ready for treatment. Please note that modifications are being made to the treatment plan including adding Avastin

## 2022-10-08 NOTE — Progress Notes (Signed)
[facility] OFFICE PROGRESS NOTE   Diagnosis: Colon cancer  INTERVAL HISTORY:   [name] returns as scheduled.  He completed cycle 9 FOLFIRI 09/16/2022.  He began a course of Levaquin 09/29/2022 after a chest CT showed inflammatory/infectious changes in the right lung.  Dyspnea has resolved.  Cough initially improved while on the antibiotic.  He noted a cough yesterday.  No fever.  No nausea or vomiting.  No mouth sores.  No diarrhea.  No bleeding.  Continued discomfort right shoulder.  He is no longer having back pain.  Objective:  Vital signs in last 24 hours:  Blood pressure 128/84, pulse 78, temperature 98 F (36.7 C), temperature source Temporal, resp. rate 18, weight 215 lb 11.2 oz (97.8 kg), SpO2 98 %.    HEENT: No thrush or ulcers. Resp: Lungs clear bilaterally. Cardio: Regular rate and rhythm. GI: No hepatosplenomegaly. Vascular: No leg edema. Skin: Palms without erythema. Port-A-Cath without erythema.  Lab Results:  Lab Results  Component Value Date   WBC 7.4 10/08/2022   HGB 12.8 (L) 10/08/2022   HCT 38.4 (L) 10/08/2022   MCV 103.2 (H) 10/08/2022   PLT 164 10/08/2022   NEUTROABS 5.3 10/08/2022    Imaging:  No results found.  Medications: I have reviewed the patient's current medications.  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, status post a left colectomy 03/04/2019 Mass felt to be in the transverse colon on a colonoscopy 01/07/2019 with a biopsy confirming poorly differentiated adenocarcinoma with signet ring cell features, mismatch repair protein expression normal CT abdomen/pelvis 01/08/2019 12.3 cm indeterminate splenic mass, present in 2015 on a lumbar MRI-felt to be benign, small subpleural and pleural-based nodules at the lung bases, status post left nephrectomy CT chest 01/20/2019-small bilateral pulmonary nodules with  rounded parenchymal nodules in the right lower lobe, indeterminate splenic mass CT chest 03/18/2019-multiple subcentimeter pulmonary nodules again seen bilaterally, greatest in the lower lobes, without significant change.  No new or enlarging pulmonary nodules or masses.  Partially visualized large heterogeneous splenic mass with no significant change.  Plan for follow-up chest CT at a 25-month interval. Cycle 1 FOLFOX 03/21/2019 Cycle 2 FOLFOX 04/04/2019 Cycle 3 FOLFOX 04/18/2019 (oxaliplatin dose reduced secondary to thrombocytopenia) Cycle 4 FOLFOX 05/02/2019 (oxaliplatin held secondary to thrombocytopenia) Cycle 5 FOLFOX 05/16/2019 Cycle 6 FOLFOX 05/30/2019 (oxaliplatin held secondary to thrombocytopenia) CT chest 06/08/2019-stable scattered small solid pulmonary nodules  Cycle 7 FOLFOX 06/13/2019 Cycle 8 FOLFOX 06/27/2019 Cycle 9 FOLFOX 07/11/2019 Cycle 10 FOLFOX 07/25/2019 (oxaliplatin held due to thrombocytopenia) Cycle 11 FOLFOX 08/08/2019 Cycle 12 FOLFOX 08/22/2019 CTs 01/09/2020-no evidence of recurrent disease, stable lung nodules favored to represent subpleural lymph nodes-dedicated follow-up not recommended CTs 01/11/2021-no evidence of recurrent disease CTs 01/24/2022-no evidence of recurrent disease CT lumbar spine, abdomen/pelvis 04/11/2022-numerous mixed lytic and sclerotic lesions scattered throughout the visualized axial and appendicular skeleton including several areas in the lumbar spine.   04/21/2022 CT biopsy sclerotic lesion right anterior iliac bone-consistent with metastatic colorectal adenocarcinoma, moderate to poorly differentiated.  Foundation 1-microsatellite stable, tumor mutation burden 4, K-ras G12S PET scan 05/09/2022-widespread hypermetabolic mixed faintly lytic and sclerotic osseous metastases throughout the axial and proximal appendicular skeleton; associated mild pathologic compression fracture at L3; no hypermetabolic extraosseous metastatic disease. Cycle 1  FOLFIRI/bevacizumab 05/26/2022 Cycle 2 FOLFIRI/bevacizumab 06/09/2022 Cycle 3 FOLFIRI 06/24/2022, bevacizumab 06/26/2022 Cycle 4 FOLFIRI 07/08/2022, bevacizumab held due to proteinuria Cycle 5 FOLFIRI 07/22/2022, bevacizumab held due to proteinuria  CTs 07/31/2022-potentially areas of sclerosis represent interval healing; new lytic lesion at T12, multiple new areas of punched-out sclerosis throughout the visualized osseous structures.  Index lesion in the left iliac wing measures 1.3 x 2.8 cm.  Healing of previously metabolically occult metastatic disease on 05/09/2022 is a possibility. Cycle 6 FOLFIRI 08/04/2022, irinotecan dose reduced due to thrombocytopenia, bevacizumab held pending 24-hour urine result Cycle 7 FOLFIRI 08/18/2022, bevacizumab held secondary to proteinuria, G-CSF held Cycle 8 FOLFIRI 09/01/2022, bevacizumab held, G-CSF Cycle 9 FOLFIRI 09/16/2022, bevacizumab held, G-CSF CTs 09/29/2022-enlargement of a hypodense lesion posterior left lobe of the liver, similar diffuse mixed lytic and sclerotic osseous metastatic disease, multiple new small nodules medial right lung base, multiple other small bilateral pulmonary nodules unchanged. 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  Left nephrectomy at age 39 12.  Port-A-Cath placement, Dr. Michaell Cowing, 03/16/2019 13.  Thrombocytopenia secondary to chemotherapy-oxaliplatin dose reduced beginning with cycle 3 FOLFOX, improved 14.  Oxaliplatin neuropathy, mild loss  of vibratory sense on exam 06/27/2019, 08/08/2019 15.  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  Right groin soft tissue infection May 2023-status postsurgical debridement 18.  Left leg DVT-common femoral, femoral, popliteal, posterior tibial, and peroneal veins 19.  02/04/2022-MRI cervical spine-1.7 cm T1 lesion-indeterminate; bone scan 03/14/2022-focal intense activity at approximate T1 level felt to correspond to the lesion on MRI, additional foci of activity involving the lower thoracic and lumbar spine, right iliac bone and pubic symphysis.  CT lumbar spine, abdomen/pelvis 04/11/2022-numerous mixed lytic and sclerotic lesions scattered throughout the visualized axial and appendicular skeleton including several areas in the lumbar spine.  04/21/2022 CT biopsy sclerotic lesion right anterior iliac bone-consistent with metastatic colorectal adenocarcinoma, moderate to poorly differentiated 20.  Inflamed sebaceous cyst 08/18/2022-doxycycline  Disposition: [name] appears stable.  He has completed 9 cycles of FOLFIRI.  Avastin has been on hold due to proteinuria.  He had restaging CT scans 09/29/2022.  We reviewed the results/images with him and his wife at today's visit.  He appears to have stable disease.  It is difficult to compare the size of the liver lesion due to the comparison study without IV contrast.  His clinical status is significantly improved as compared to prechemotherapy.  CEA is in low normal range.  Plan to continue FOLFIRI and resume Avastin today.  He agrees with this plan.  CBC and chemistry panel reviewed.  Labs adequate to proceed as above.  Urine protein is lower.  Creatinine is normal.  Plan to resume Avastin as above.  He will return for follow-up and treatment in 3 weeks rather than 2 due to a planned trip.  Patient seen with [name].    [name] ANP/GNP-BC   10/08/2022   10:29 AM  This was a shared visit with [name].  Mr. Brandel has recovered from the recent upper respiratory infection.  We reviewed the restaging CT findings and images with him.  We suspect the nodular lesions in the lungs are related to inflammation.  The single hypodense liver lesion does not appear  significantly changed and is difficult to compare between the noncontrast and contrast CTs.  His clinical status and elevated CEA have improved while on FOLFIRI/bevacizumab.  We recommend continuing FOLFIRI/bevacizumab.  I was present for greater than 50% of today's visit.  I performed medical decision making.  Mancel Bale, MD

## 2022-10-08 NOTE — Patient Instructions (Signed)
[facility] The chemotherapy medication bag should finish at 46 hours, 96 hours, or 7 days. For example, if your pump is scheduled for 46 hours and it was put on at 4:00 p.m., it should finish at 2:00 p.m. the day it is scheduled to come off regardless of your appointment time.     Estimated time to finish at 1:30 Friday, October 10, 2022 .   If the display on your pump reads "Low Volume" and it is beeping, take the batteries out of the pump and come to the cancer center for it to be taken off.   If the pump alarms go off prior to the pump reading "Low Volume" then call [phone] and someone can assist you.  If the plunger comes out and the chemotherapy medication is leaking out, please use your home chemo spill kit to clean up the spill. Do NOT use paper towels or other household products.  If you have problems or questions regarding your pump, please call either [phone] (24 hours a day) or the cancer center Monday-Friday 8:00 a.m.- 4:30 p.m. at the clinic number and we will assist you. If you are unable to get assistance, then go to the nearest Emergency Department and ask the staff to contact the IV team for assistance.    Discharge Instructions: Thank you for choosing [facility] to provide your oncology and hematology care.   If you have a lab appointment with the Cancer Center, please go directly to the Cancer Center and check in at the registration area.   Wear comfortable clothing and clothing appropriate for easy access to any Portacath or PICC line.   We strive to give you quality time with your provider. You may need to reschedule your appointment if you arrive late (15 or more minutes).  Arriving late affects you and other patients whose appointments are after yours.  Also, if you miss three or more appointments without notifying the office, you may be dismissed from the clinic at the provider's discretion.      For  prescription refill requests, have your pharmacy contact our office and allow 72 hours for refills to be completed.    Today you received the following chemotherapy and/or immunotherapy agents Avastin, Irinotecan, Leucovorin, Fluorouracil.      To help prevent nausea and vomiting after your treatment, we encourage you to take your nausea medication as directed.  BELOW ARE SYMPTOMS THAT SHOULD BE REPORTED IMMEDIATELY: *FEVER GREATER THAN 100.4 F (38 C) OR HIGHER *CHILLS OR SWEATING *NAUSEA AND VOMITING THAT IS NOT CONTROLLED WITH YOUR NAUSEA MEDICATION *UNUSUAL SHORTNESS OF BREATH *UNUSUAL BRUISING OR BLEEDING *URINARY PROBLEMS (pain or burning when urinating, or frequent urination) *BOWEL PROBLEMS (unusual diarrhea, constipation, pain near the anus) TENDERNESS IN MOUTH AND THROAT WITH OR WITHOUT PRESENCE OF ULCERS (sore throat, sores in mouth, or a toothache) UNUSUAL RASH, SWELLING OR PAIN  UNUSUAL VAGINAL DISCHARGE OR ITCHING   Items with * indicate a potential emergency and should be followed up as soon as possible or go to the Emergency Department if any problems should occur.  Please show the CHEMOTHERAPY ALERT CARD or IMMUNOTHERAPY ALERT CARD at check-in to the Emergency Department and triage nurse.  Should you have questions after your visit or need to cancel or reschedule your appointment, please contact  [facility] AT [facility]  Dept: [phone]  and follow the prompts.  Office hours are 8:00 a.m. to 4:30 p.m. Monday - Friday.  Please note that voicemails left after 4:00 p.m. may not be returned until the following business day.  We are closed weekends and major holidays. You have access to a nurse at all times for urgent questions. Please call the main number to the clinic Dept: [phone] and follow the prompts.   For any non-urgent questions, you may also contact your provider using MyChart. We now offer e-Visits for anyone 76 and older to request care  online for non-urgent symptoms. For details visit [email or web address].   Also download the MyChart app! Go to the app store, search "MyChart", open the app, select [facility], and log in with your MyChart username and password.

## 2022-10-09 ENCOUNTER — Telehealth: Payer: Self-pay | Admitting: Oncology

## 2022-10-09 NOTE — Telephone Encounter (Signed)
Scheduled per 07/10 los, patient has been called and voicemail was left.

## 2022-10-10 ENCOUNTER — Other Ambulatory Visit: Payer: Self-pay

## 2022-10-10 ENCOUNTER — Inpatient Hospital Stay: Payer: BC Managed Care – PPO

## 2022-10-10 VITALS — BP 123/78 | HR 70 | Temp 98.3°F | Resp 18

## 2022-10-10 DIAGNOSIS — Z5112 Encounter for antineoplastic immunotherapy: Secondary | ICD-10-CM | POA: Diagnosis not present

## 2022-10-10 DIAGNOSIS — Z5111 Encounter for antineoplastic chemotherapy: Secondary | ICD-10-CM | POA: Diagnosis not present

## 2022-10-10 DIAGNOSIS — C7951 Secondary malignant neoplasm of bone: Secondary | ICD-10-CM | POA: Diagnosis not present

## 2022-10-10 DIAGNOSIS — Z5189 Encounter for other specified aftercare: Secondary | ICD-10-CM | POA: Diagnosis not present

## 2022-10-10 DIAGNOSIS — C187 Malignant neoplasm of sigmoid colon: Secondary | ICD-10-CM | POA: Diagnosis not present

## 2022-10-10 DIAGNOSIS — C186 Malignant neoplasm of descending colon: Secondary | ICD-10-CM

## 2022-10-10 MED ORDER — PEGFILGRASTIM-CBQV 6 MG/0.6ML ~~LOC~~ SOSY
6.0000 mg | PREFILLED_SYRINGE | Freq: Once | SUBCUTANEOUS | Status: AC
  Administered 2022-10-10: 6 mg via SUBCUTANEOUS
  Filled 2022-10-10: qty 0.6

## 2022-10-10 MED ORDER — SODIUM CHLORIDE 0.9% FLUSH
10.0000 mL | INTRAVENOUS | Status: DC | PRN
  Administered 2022-10-10: 10 mL

## 2022-10-10 MED ORDER — HEPARIN SOD (PORK) LOCK FLUSH 100 UNIT/ML IV SOLN
500.0000 [IU] | Freq: Once | INTRAVENOUS | Status: AC | PRN
  Administered 2022-10-10: 500 [IU]

## 2022-10-10 NOTE — Patient Instructions (Signed)

## 2022-10-13 ENCOUNTER — Inpatient Hospital Stay: Payer: BC Managed Care – PPO

## 2022-10-13 ENCOUNTER — Inpatient Hospital Stay: Payer: BC Managed Care – PPO | Admitting: Nurse Practitioner

## 2022-10-15 ENCOUNTER — Inpatient Hospital Stay: Payer: BC Managed Care – PPO

## 2022-10-20 ENCOUNTER — Other Ambulatory Visit: Payer: BC Managed Care – PPO

## 2022-10-20 ENCOUNTER — Ambulatory Visit: Payer: BC Managed Care – PPO

## 2022-10-20 ENCOUNTER — Ambulatory Visit: Payer: BC Managed Care – PPO | Admitting: Nurse Practitioner

## 2022-10-22 ENCOUNTER — Inpatient Hospital Stay: Payer: BC Managed Care – PPO

## 2022-10-22 ENCOUNTER — Inpatient Hospital Stay: Payer: BC Managed Care – PPO | Admitting: Oncology

## 2022-10-24 ENCOUNTER — Inpatient Hospital Stay: Payer: BC Managed Care – PPO

## 2022-10-24 DIAGNOSIS — C186 Malignant neoplasm of descending colon: Secondary | ICD-10-CM | POA: Diagnosis not present

## 2022-10-26 ENCOUNTER — Other Ambulatory Visit: Payer: Self-pay | Admitting: Oncology

## 2022-10-26 DIAGNOSIS — C186 Malignant neoplasm of descending colon: Secondary | ICD-10-CM

## 2022-10-27 ENCOUNTER — Inpatient Hospital Stay: Payer: BC Managed Care – PPO

## 2022-10-27 ENCOUNTER — Encounter: Payer: Self-pay | Admitting: Nurse Practitioner

## 2022-10-27 ENCOUNTER — Telehealth: Payer: Self-pay | Admitting: Nurse Practitioner

## 2022-10-27 ENCOUNTER — Inpatient Hospital Stay: Payer: BC Managed Care – PPO | Admitting: Nurse Practitioner

## 2022-10-27 VITALS — BP 137/88 | HR 84 | Temp 98.1°F | Resp 18 | Ht 72.0 in | Wt 216.5 lb

## 2022-10-27 DIAGNOSIS — C187 Malignant neoplasm of sigmoid colon: Secondary | ICD-10-CM | POA: Diagnosis not present

## 2022-10-27 DIAGNOSIS — Z5112 Encounter for antineoplastic immunotherapy: Secondary | ICD-10-CM | POA: Diagnosis not present

## 2022-10-27 DIAGNOSIS — Z5189 Encounter for other specified aftercare: Secondary | ICD-10-CM | POA: Diagnosis not present

## 2022-10-27 DIAGNOSIS — Z5111 Encounter for antineoplastic chemotherapy: Secondary | ICD-10-CM | POA: Diagnosis not present

## 2022-10-27 DIAGNOSIS — L02219 Cutaneous abscess of trunk, unspecified: Secondary | ICD-10-CM | POA: Diagnosis not present

## 2022-10-27 DIAGNOSIS — C7951 Secondary malignant neoplasm of bone: Secondary | ICD-10-CM | POA: Diagnosis not present

## 2022-10-27 DIAGNOSIS — C186 Malignant neoplasm of descending colon: Secondary | ICD-10-CM | POA: Diagnosis not present

## 2022-10-27 DIAGNOSIS — Z95828 Presence of other vascular implants and grafts: Secondary | ICD-10-CM

## 2022-10-27 LAB — CMP (CANCER CENTER ONLY)
ALT: 26 U/L (ref 0–44)
AST: 30 U/L (ref 15–41)
Albumin: 3.9 g/dL (ref 3.5–5.0)
Alkaline Phosphatase: 136 U/L — ABNORMAL HIGH (ref 38–126)
Anion gap: 10 (ref 5–15)
BUN: 19 mg/dL (ref 6–20)
CO2: 25 mmol/L (ref 22–32)
Calcium: 9.3 mg/dL (ref 8.9–10.3)
Chloride: 101 mmol/L (ref 98–111)
Creatinine: 1.09 mg/dL (ref 0.61–1.24)
GFR, Estimated: >60 mL/min (ref >60)
Glucose, Bld: 204 mg/dL — ABNORMAL HIGH (ref 70–99)
Potassium: 4.3 mmol/L (ref 3.5–5.1)
Sodium: 136 mmol/L (ref 135–145)
Total Bilirubin: 0.9 mg/dL (ref 0.3–1.2)
Total Protein: 7 g/dL (ref 6.5–8.1)

## 2022-10-27 LAB — CBC WITH DIFFERENTIAL (CANCER CENTER ONLY)
Abs Immature Granulocytes: 0.12 10*3/uL — ABNORMAL HIGH (ref 0.00–0.07)
Basophils Absolute: 0.1 10*3/uL (ref 0.0–0.1)
Basophils Relative: 1 %
Eosinophils Absolute: 0.2 10*3/uL (ref 0.0–0.5)
Eosinophils Relative: 4 %
HCT: 37.8 % — ABNORMAL LOW (ref 39.0–52.0)
Hemoglobin: 12.9 g/dL — ABNORMAL LOW (ref 13.0–17.0)
Immature Granulocytes: 2 %
Lymphocytes Relative: 13 %
Lymphs Abs: 0.7 10*3/uL (ref 0.7–4.0)
MCH: 34.9 pg — ABNORMAL HIGH (ref 26.0–34.0)
MCHC: 34.1 g/dL (ref 30.0–36.0)
MCV: 102.2 fL — ABNORMAL HIGH (ref 80.0–100.0)
Monocytes Absolute: 0.8 10*3/uL (ref 0.1–1.0)
Monocytes Relative: 14 %
Neutro Abs: 3.6 10*3/uL (ref 1.7–7.7)
Neutrophils Relative %: 66 %
Platelet Count: 125 10*3/uL — ABNORMAL LOW (ref 150–400)
RBC: 3.7 MIL/uL — ABNORMAL LOW (ref 4.22–5.81)
RDW: 14 % (ref 11.5–15.5)
WBC Count: 5.5 10*3/uL (ref 4.0–10.5)
nRBC: 0 % (ref 0.0–0.2)

## 2022-10-27 LAB — TOTAL PROTEIN, URINE DIPSTICK: Protein, ur: 100 mg/dL — AB

## 2022-10-27 LAB — CEA (ACCESS): CEA (CHCC): 1.98 ng/mL (ref 0.00–5.00)

## 2022-10-27 MED ORDER — SODIUM CHLORIDE 0.9 % IV SOLN
10.0000 mg | Freq: Once | INTRAVENOUS | Status: AC
  Administered 2022-10-27: 10 mg via INTRAVENOUS
  Filled 2022-10-27: qty 1

## 2022-10-27 MED ORDER — SODIUM CHLORIDE 0.9 % IV SOLN: Freq: Once | INTRAVENOUS | Status: AC

## 2022-10-27 MED ORDER — PALONOSETRON HCL INJECTION 0.25 MG/5ML
0.2500 mg | Freq: Once | INTRAVENOUS | Status: AC
  Administered 2022-10-27: 0.25 mg via INTRAVENOUS
  Filled 2022-10-27: qty 5

## 2022-10-27 MED ORDER — ATROPINE SULFATE 1 MG/ML IV SOLN
0.5000 mg | Freq: Once | INTRAVENOUS | Status: AC | PRN
  Administered 2022-10-27: 0.5 mg via INTRAVENOUS
  Filled 2022-10-27: qty 1

## 2022-10-27 MED ORDER — SODIUM CHLORIDE 0.9 % IV SOLN
2400.0000 mg/m2 | INTRAVENOUS | Status: DC
  Administered 2022-10-27: 5000 mg via INTRAVENOUS
  Filled 2022-10-27: qty 100

## 2022-10-27 MED ORDER — SODIUM CHLORIDE 0.9 % IV SOLN
140.0000 mg/m2 | Freq: Once | INTRAVENOUS | Status: AC
  Administered 2022-10-27: 300 mg via INTRAVENOUS
  Filled 2022-10-27: qty 15

## 2022-10-27 MED ORDER — SODIUM CHLORIDE 0.9 % IV SOLN
400.0000 mg/m2 | Freq: Once | INTRAVENOUS | Status: AC
  Administered 2022-10-27: 892 mg via INTRAVENOUS
  Filled 2022-10-27: qty 44.6

## 2022-10-27 MED ORDER — ZOLEDRONIC ACID 4 MG/100ML IV SOLN
4.0000 mg | Freq: Once | INTRAVENOUS | Status: AC
  Administered 2022-10-27: 4 mg via INTRAVENOUS
  Filled 2022-10-27: qty 100

## 2022-10-27 MED ORDER — FLUOROURACIL CHEMO INJECTION 2.5 GM/50ML
400.0000 mg/m2 | Freq: Once | INTRAVENOUS | Status: AC
  Administered 2022-10-27: 900 mg via INTRAVENOUS
  Filled 2022-10-27: qty 18

## 2022-10-27 NOTE — Patient Instructions (Addendum)
[place] [facility] The chemotherapy medication bag should finish at 46 hours, 96 hours, or 7 days. For example, if your pump is scheduled for 46 hours and it was put on at 4:00 p.m., it should finish at 2:00 p.m. the day it is scheduled to come off regardless of your appointment time.     Estimated time to finish at: 11:15am Wednesday October 29, 2022 .   If the display on your pump reads "Low Volume" and it is beeping, take the batteries out of the pump and come to the cancer center for it to be taken off.   If the pump alarms go off prior to the pump reading "Low Volume" then call [phone] and someone can assist you.  If the plunger comes out and the chemotherapy medication is leaking out, please use your home chemo spill kit to clean up the spill. Do NOT use paper towels or other household products.  If you have problems or questions regarding your pump, please call either [phone] (24 hours a day) or the cancer center Monday-Friday 8:00 a.m.- 4:30 p.m. at the clinic number and we will assist you. If you are unable to get assistance, then go to the nearest Emergency Department and ask the staff to contact the IV team for assistance.    Discharge Instructions: Thank you for choosing [facility] to provide your oncology and hematology care.   If you have a lab appointment with the Cancer Center, please go directly to the Cancer Center and check in at the registration area.   Wear comfortable clothing and clothing appropriate for easy access to any Portacath or PICC line.   We strive to give you quality time with your provider. You may need to reschedule your appointment if you arrive late (15 or more minutes).  Arriving late affects you and other patients whose appointments are after yours.  Also, if you miss three or more appointments without notifying the office, you may be dismissed from the clinic at the provider's discretion.      For  prescription refill requests, have your pharmacy contact our office and allow 72 hours for refills to be completed.    Today you received the following chemotherapy and/or immunotherapy agents Avastin, Irinotecan, Leucovorin, Fluorouracil.      To help prevent nausea and vomiting after your treatment, we encourage you to take your nausea medication as directed.  BELOW ARE SYMPTOMS THAT SHOULD BE REPORTED IMMEDIATELY: *FEVER GREATER THAN 100.4 F (38 C) OR HIGHER *CHILLS OR SWEATING *NAUSEA AND VOMITING THAT IS NOT CONTROLLED WITH YOUR NAUSEA MEDICATION *UNUSUAL SHORTNESS OF BREATH *UNUSUAL BRUISING OR BLEEDING *URINARY PROBLEMS (pain or burning when urinating, or frequent urination) *BOWEL PROBLEMS (unusual diarrhea, constipation, pain near the anus) TENDERNESS IN MOUTH AND THROAT WITH OR WITHOUT PRESENCE OF ULCERS (sore throat, sores in mouth, or a toothache) UNUSUAL RASH, SWELLING OR PAIN  UNUSUAL VAGINAL DISCHARGE OR ITCHING   Items with * indicate a potential emergency and should be followed up as soon as possible or go to the Emergency Department if any problems should occur.  Please show the CHEMOTHERAPY ALERT CARD or IMMUNOTHERAPY ALERT CARD at check-in to the Emergency Department and triage nurse.  Should you have questions after your visit or need to cancel or reschedule your appointment, please contact [place] [facility] AT [facility]  Dept: [phone]  and follow the prompts.  Office hours are 8:00 a.m. to 4:30 p.m. Monday - Friday.  Please note that voicemails left after 4:00 p.m. may not be returned until the following business day.  We are closed weekends and major holidays. You have access to a nurse at all times for urgent questions. Please call the main number to the clinic Dept: [phone] and follow the prompts.   For any non-urgent questions, you may also contact your provider using MyChart. We now offer e-Visits for anyone 62 and older to request care  online for non-urgent symptoms. For details visit [email or web address].   Also download the MyChart app! Go to the app store, search "MyChart", open the app, select [facility], and log in with your MyChart username and password.

## 2022-10-27 NOTE — Progress Notes (Unsigned)
Patient seen by Lonna Cobb NP today  Vitals are within treatment parameters.  Labs reviewed by Lonna Cobb NP and are not all within treatment parameters. Urine protein is 100.  Per physician team, patient is ready for treatment. Please note that modifications are being made to the treatment plan including no Avastin today per Lonna Cobb, NP.

## 2022-10-27 NOTE — Telephone Encounter (Signed)
Per Roxan Diesel NP [facility] called and was notified that Pt had avastin 2 weeks ago. Nurse stated she will make NP aware.

## 2022-10-27 NOTE — Patient Instructions (Signed)

## 2022-10-27 NOTE — Progress Notes (Unsigned)
Per Lonna Cobb NP, TC to [facility] to see if Pt. Could be seen today or tomorrow. For recurrent cyst in the suprapubic area Spoke with Dr Michaell Cowing' office. I was informed Dr Michaell Cowing is out of the office. They inquired if cyst are draining. Pt confirmed they are draining. Appointment made today with Puja NP. Confirmed with Pt he was told to be at the office for a 345 appointment.

## 2022-10-27 NOTE — Progress Notes (Signed)
[facility] OFFICE PROGRESS NOTE   Diagnosis: Colon cancer  INTERVAL HISTORY:   [name] returns as scheduled.  He completed cycle 10 FOLFIRI plus Avastin 10/08/2022.  He denies nausea/vomiting.  No mouth sores.  No diarrhea.  No bleeding.  He has a recurrent cyst in the left groin.  The cyst ruptured over the weekend.  He was having chills prior to this occurring.  No fever.  Objective:  Vital signs in last 24 hours:  Blood pressure 137/88, pulse 84, temperature 98.1 F (36.7 C), temperature source Oral, resp. rate 18, height 6' (1.829 m), weight 216 lb 8 oz (98.2 kg), SpO2 100%.    HEENT: No thrush or ulcers. Resp: Lungs clear bilaterally. Cardio: Regular rate and rhythm. GI: No hepatomegaly. Vascular: No leg edema. Skin: Palms without erythema.  Firm mildly erythematous approximate 3 to 4 cm cutaneous lesion suprapubic region, serosanguineous drainage expressed. Port-A-Cath without erythema.  Lab Results:  Lab Results  Component Value Date   WBC 5.5 10/27/2022   HGB 12.9 (L) 10/27/2022   HCT 37.8 (L) 10/27/2022   MCV 102.2 (H) 10/27/2022   PLT 125 (L) 10/27/2022   NEUTROABS 3.6 10/27/2022    Imaging:  No results found.  Medications: I have reviewed the patient's current medications.  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, status post a left colectomy 03/04/2019 Mass felt to be in the transverse colon on a colonoscopy 01/07/2019 with a biopsy confirming poorly differentiated adenocarcinoma with signet ring cell features, mismatch repair protein expression normal CT abdomen/pelvis 01/08/2019 12.3 cm indeterminate splenic mass, present in 2015 on a lumbar MRI-felt to be benign, small subpleural and pleural-based nodules at the lung bases, status post left nephrectomy CT chest 01/20/2019-small bilateral pulmonary nodules with rounded  parenchymal nodules in the right lower lobe, indeterminate splenic mass CT chest 03/18/2019-multiple subcentimeter pulmonary nodules again seen bilaterally, greatest in the lower lobes, without significant change.  No new or enlarging pulmonary nodules or masses.  Partially visualized large heterogeneous splenic mass with no significant change.  Plan for follow-up chest CT at a 60-month interval. Cycle 1 FOLFOX 03/21/2019 Cycle 2 FOLFOX 04/04/2019 Cycle 3 FOLFOX 04/18/2019 (oxaliplatin dose reduced secondary to thrombocytopenia) Cycle 4 FOLFOX 05/02/2019 (oxaliplatin held secondary to thrombocytopenia) Cycle 5 FOLFOX 05/16/2019 Cycle 6 FOLFOX 05/30/2019 (oxaliplatin held secondary to thrombocytopenia) CT chest 06/08/2019-stable scattered small solid pulmonary nodules  Cycle 7 FOLFOX 06/13/2019 Cycle 8 FOLFOX 06/27/2019 Cycle 9 FOLFOX 07/11/2019 Cycle 10 FOLFOX 07/25/2019 (oxaliplatin held due to thrombocytopenia) Cycle 11 FOLFOX 08/08/2019 Cycle 12 FOLFOX 08/22/2019 CTs 01/09/2020-no evidence of recurrent disease, stable lung nodules favored to represent subpleural lymph nodes-dedicated follow-up not recommended CTs 01/11/2021-no evidence of recurrent disease CTs 01/24/2022-no evidence of recurrent disease CT lumbar spine, abdomen/pelvis 04/11/2022-numerous mixed lytic and sclerotic lesions scattered throughout the visualized axial and appendicular skeleton including several areas in the lumbar spine.   04/21/2022 CT biopsy sclerotic lesion right anterior iliac bone-consistent with metastatic colorectal adenocarcinoma, moderate to poorly differentiated.  Foundation 1-microsatellite stable, tumor mutation burden 4, K-ras G12S PET scan 05/09/2022-widespread hypermetabolic mixed faintly lytic and sclerotic osseous metastases throughout the axial and proximal appendicular skeleton; associated mild pathologic compression fracture at L3; no hypermetabolic extraosseous metastatic disease. Cycle 1 FOLFIRI/bevacizumab  05/26/2022 Cycle 2 FOLFIRI/bevacizumab 06/09/2022 Cycle 3 FOLFIRI 06/24/2022, bevacizumab 06/26/2022 Cycle 4 FOLFIRI 07/08/2022, bevacizumab held due to proteinuria Cycle 5 FOLFIRI 07/22/2022, bevacizumab held due to proteinuria CTs 07/31/2022-potentially  areas of sclerosis represent interval healing; new lytic lesion at T12, multiple new areas of punched-out sclerosis throughout the visualized osseous structures.  Index lesion in the left iliac wing measures 1.3 x 2.8 cm.  Healing of previously metabolically occult metastatic disease on 05/09/2022 is a possibility. Cycle 6 FOLFIRI 08/04/2022, irinotecan dose reduced due to thrombocytopenia, bevacizumab held pending 24-hour urine result Cycle 7 FOLFIRI 08/18/2022, bevacizumab held secondary to proteinuria, G-CSF held Cycle 8 FOLFIRI 09/01/2022, bevacizumab held, G-CSF Cycle 9 FOLFIRI 09/16/2022, bevacizumab held, G-CSF CTs 09/29/2022-enlargement of a hypodense lesion posterior left lobe of the liver, similar diffuse mixed lytic and sclerotic osseous metastatic disease, multiple new small nodules medial right lung base, multiple other small bilateral pulmonary nodules unchanged. 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  Left nephrectomy at age 48 12.  Port-A-Cath placement, Dr. Michaell Cowing, 03/16/2019 13.  Thrombocytopenia secondary to chemotherapy-oxaliplatin dose reduced beginning with cycle 3 FOLFOX, improved 14.  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  Right groin soft tissue infection May 2023-status postsurgical debridement 18.  Left leg DVT-common femoral, femoral, popliteal, posterior tibial, and peroneal veins 19.  02/04/2022-MRI cervical spine-1.7 cm T1 lesion-indeterminate; bone scan 03/14/2022-focal intense activity at approximate T1 level felt to correspond to the lesion on MRI, additional foci of activity involving the lower thoracic and lumbar spine, right iliac bone and pubic symphysis.  CT lumbar spine, abdomen/pelvis 04/11/2022-numerous mixed lytic and sclerotic lesions scattered throughout the visualized axial and appendicular skeleton including several areas in the lumbar spine.  04/21/2022 CT biopsy sclerotic lesion right anterior iliac bone-consistent with metastatic colorectal adenocarcinoma, moderate to poorly differentiated 20.  Inflamed sebaceous cyst 08/18/2022-doxycycline  Disposition: [name] appears stable.  He completed cycle 11 FOLFIRI plus Avastin 10/27/2022.  He tolerated well.  Plan to proceed with cycle 12 today as scheduled.  Avastin will be held due to what appears to be an inflamed sebaceous cyst at the suprapubic region, possibly will need to be drained.  We will try to arrange for an appointment this week with the acute clinic at [facility] surgery.  CBC and chemistry panel reviewed.  Labs adequate to proceed with treatment today as scheduled.  Urine protein is elevated at 100.  He will complete a 24-hour urine prior to next office visit.  He will return for follow-up and treatment in 2 weeks.  We are available to see him sooner if needed.   Patient seen with [name].   Lonna Cobb ANP/GNP-BC   10/27/2022  9:16 AM This was a shared visit with Lonna Cobb.  [name] was interviewed and  examined.  He appears to have an flame sebaceous cyst in the left suprapubic area.  The same area was recently treated with a course of antibiotics.  We will refer him to the surgical service to consider drainage of this lesion.  He will complete another cycle of FOLFIRI today.  Bevacizumab will be placed on  hold.  I was present for greater than 50% of today's visit.  I performed medical decision making.  Mancel Bale, MD

## 2022-10-28 ENCOUNTER — Encounter: Payer: Self-pay | Admitting: Oncology

## 2022-10-28 ENCOUNTER — Other Ambulatory Visit: Payer: Self-pay

## 2022-10-28 DIAGNOSIS — I10 Essential (primary) hypertension: Secondary | ICD-10-CM | POA: Diagnosis not present

## 2022-10-28 DIAGNOSIS — J45909 Unspecified asthma, uncomplicated: Secondary | ICD-10-CM | POA: Diagnosis not present

## 2022-10-28 DIAGNOSIS — E7800 Pure hypercholesterolemia, unspecified: Secondary | ICD-10-CM | POA: Diagnosis not present

## 2022-10-28 DIAGNOSIS — E1169 Type 2 diabetes mellitus with other specified complication: Secondary | ICD-10-CM | POA: Diagnosis not present

## 2022-10-29 ENCOUNTER — Inpatient Hospital Stay: Payer: BC Managed Care – PPO

## 2022-10-29 VITALS — BP 127/83 | HR 78 | Temp 98.1°F | Resp 18

## 2022-10-29 DIAGNOSIS — C186 Malignant neoplasm of descending colon: Secondary | ICD-10-CM

## 2022-10-29 DIAGNOSIS — Z5111 Encounter for antineoplastic chemotherapy: Secondary | ICD-10-CM | POA: Diagnosis not present

## 2022-10-29 DIAGNOSIS — Z5112 Encounter for antineoplastic immunotherapy: Secondary | ICD-10-CM | POA: Diagnosis not present

## 2022-10-29 DIAGNOSIS — Z5189 Encounter for other specified aftercare: Secondary | ICD-10-CM | POA: Diagnosis not present

## 2022-10-29 DIAGNOSIS — C187 Malignant neoplasm of sigmoid colon: Secondary | ICD-10-CM | POA: Diagnosis not present

## 2022-10-29 DIAGNOSIS — C7951 Secondary malignant neoplasm of bone: Secondary | ICD-10-CM | POA: Diagnosis not present

## 2022-10-29 MED ORDER — SODIUM CHLORIDE 0.9% FLUSH
10.0000 mL | INTRAVENOUS | Status: DC | PRN
  Administered 2022-10-29: 10 mL

## 2022-10-29 MED ORDER — PEGFILGRASTIM-CBQV 6 MG/0.6ML ~~LOC~~ SOSY
6.0000 mg | PREFILLED_SYRINGE | Freq: Once | SUBCUTANEOUS | Status: AC
  Administered 2022-10-29: 6 mg via SUBCUTANEOUS
  Filled 2022-10-29: qty 0.6

## 2022-10-29 MED ORDER — HEPARIN SOD (PORK) LOCK FLUSH 100 UNIT/ML IV SOLN
500.0000 [IU] | Freq: Once | INTRAVENOUS | Status: AC | PRN
  Administered 2022-10-29: 500 [IU]

## 2022-10-29 NOTE — Patient Instructions (Signed)

## 2022-10-30 DIAGNOSIS — C61 Malignant neoplasm of prostate: Secondary | ICD-10-CM | POA: Insufficient documentation

## 2022-10-30 DIAGNOSIS — C787 Secondary malignant neoplasm of liver and intrahepatic bile duct: Secondary | ICD-10-CM | POA: Diagnosis not present

## 2022-10-30 DIAGNOSIS — Z5189 Encounter for other specified aftercare: Secondary | ICD-10-CM | POA: Insufficient documentation

## 2022-10-30 DIAGNOSIS — Z5112 Encounter for antineoplastic immunotherapy: Secondary | ICD-10-CM | POA: Insufficient documentation

## 2022-10-30 DIAGNOSIS — Z5111 Encounter for antineoplastic chemotherapy: Secondary | ICD-10-CM | POA: Diagnosis not present

## 2022-10-30 DIAGNOSIS — C7951 Secondary malignant neoplasm of bone: Secondary | ICD-10-CM | POA: Insufficient documentation

## 2022-10-30 DIAGNOSIS — C186 Malignant neoplasm of descending colon: Secondary | ICD-10-CM | POA: Insufficient documentation

## 2022-10-31 ENCOUNTER — Telehealth: Payer: Self-pay | Admitting: *Deleted

## 2022-10-31 ENCOUNTER — Other Ambulatory Visit: Payer: Self-pay | Admitting: *Deleted

## 2022-10-31 DIAGNOSIS — C186 Malignant neoplasm of descending colon: Secondary | ICD-10-CM

## 2022-10-31 LAB — PROTEIN, URINE, 24 HOUR
Collection Interval-UPROT: 24 hours
Protein, 24H Urine: 540 mg/d — ABNORMAL HIGH (ref 50–100)
Protein, Urine: 20 mg/dL
Urine Total Volume-UPROT: 2700 mL

## 2022-10-31 NOTE — Telephone Encounter (Signed)
Left VM and sent Mychart message requesting the start date/time and end date/time of his 24 hour urine collection and if he missed any voids.

## 2022-11-06 ENCOUNTER — Other Ambulatory Visit: Payer: Self-pay | Admitting: Oncology

## 2022-11-06 DIAGNOSIS — C186 Malignant neoplasm of descending colon: Secondary | ICD-10-CM

## 2022-11-11 ENCOUNTER — Inpatient Hospital Stay: Payer: BC Managed Care – PPO | Attending: Oncology

## 2022-11-11 ENCOUNTER — Inpatient Hospital Stay: Payer: BC Managed Care – PPO

## 2022-11-11 ENCOUNTER — Inpatient Hospital Stay: Payer: BC Managed Care – PPO | Admitting: Oncology

## 2022-11-11 VITALS — BP 137/89 | HR 83 | Temp 98.1°F | Resp 18 | Ht 72.0 in | Wt 215.1 lb

## 2022-11-11 DIAGNOSIS — C186 Malignant neoplasm of descending colon: Secondary | ICD-10-CM

## 2022-11-11 DIAGNOSIS — Z5189 Encounter for other specified aftercare: Secondary | ICD-10-CM | POA: Diagnosis not present

## 2022-11-11 DIAGNOSIS — Z5111 Encounter for antineoplastic chemotherapy: Secondary | ICD-10-CM | POA: Diagnosis not present

## 2022-11-11 DIAGNOSIS — C7951 Secondary malignant neoplasm of bone: Secondary | ICD-10-CM | POA: Diagnosis not present

## 2022-11-11 DIAGNOSIS — C787 Secondary malignant neoplasm of liver and intrahepatic bile duct: Secondary | ICD-10-CM | POA: Diagnosis not present

## 2022-11-11 DIAGNOSIS — Z5112 Encounter for antineoplastic immunotherapy: Secondary | ICD-10-CM | POA: Diagnosis not present

## 2022-11-11 DIAGNOSIS — C61 Malignant neoplasm of prostate: Secondary | ICD-10-CM | POA: Diagnosis not present

## 2022-11-11 LAB — CBC WITH DIFFERENTIAL (CANCER CENTER ONLY)
Abs Immature Granulocytes: 0.69 10*3/uL — ABNORMAL HIGH (ref 0.00–0.07)
Basophils Absolute: 0.1 10*3/uL (ref 0.0–0.1)
Basophils Relative: 1 %
Eosinophils Absolute: 0.5 10*3/uL (ref 0.0–0.5)
Eosinophils Relative: 4 %
HCT: 37.8 % — ABNORMAL LOW (ref 39.0–52.0)
Hemoglobin: 12.8 g/dL — ABNORMAL LOW (ref 13.0–17.0)
Immature Granulocytes: 6 %
Lymphocytes Relative: 10 %
Lymphs Abs: 1.2 10*3/uL (ref 0.7–4.0)
MCH: 34.1 pg — ABNORMAL HIGH (ref 26.0–34.0)
MCHC: 33.9 g/dL (ref 30.0–36.0)
MCV: 100.8 fL — ABNORMAL HIGH (ref 80.0–100.0)
Monocytes Absolute: 1.1 10*3/uL — ABNORMAL HIGH (ref 0.1–1.0)
Monocytes Relative: 9 %
Neutro Abs: 8.3 10*3/uL — ABNORMAL HIGH (ref 1.7–7.7)
Neutrophils Relative %: 70 %
Platelet Count: 146 10*3/uL — ABNORMAL LOW (ref 150–400)
RBC: 3.75 MIL/uL — ABNORMAL LOW (ref 4.22–5.81)
RDW: 13.6 % (ref 11.5–15.5)
WBC Count: 11.8 10*3/uL — ABNORMAL HIGH (ref 4.0–10.5)
nRBC: 0 % (ref 0.0–0.2)

## 2022-11-11 LAB — CMP (CANCER CENTER ONLY)
ALT: 14 U/L (ref 0–44)
AST: 15 U/L (ref 15–41)
Albumin: 3.8 g/dL (ref 3.5–5.0)
Alkaline Phosphatase: 190 U/L — ABNORMAL HIGH (ref 38–126)
Anion gap: 8 (ref 5–15)
BUN: 17 mg/dL (ref 6–20)
CO2: 25 mmol/L (ref 22–32)
Calcium: 9.3 mg/dL (ref 8.9–10.3)
Chloride: 102 mmol/L (ref 98–111)
Creatinine: 0.99 mg/dL (ref 0.61–1.24)
GFR, Estimated: >60 mL/min (ref >60)
Glucose, Bld: 164 mg/dL — ABNORMAL HIGH (ref 70–99)
Potassium: 4.4 mmol/L (ref 3.5–5.1)
Sodium: 135 mmol/L (ref 135–145)
Total Bilirubin: 0.7 mg/dL (ref 0.3–1.2)
Total Protein: 6.9 g/dL (ref 6.5–8.1)

## 2022-11-11 LAB — TOTAL PROTEIN, URINE DIPSTICK: Protein, ur: 100 mg/dL — AB

## 2022-11-11 MED ORDER — SODIUM CHLORIDE 0.9 % IV SOLN
10.0000 mg | Freq: Once | INTRAVENOUS | Status: AC
  Administered 2022-11-11: 10 mg via INTRAVENOUS
  Filled 2022-11-11: qty 10

## 2022-11-11 MED ORDER — SODIUM CHLORIDE 0.9 % IV SOLN: Freq: Once | INTRAVENOUS | Status: AC

## 2022-11-11 MED ORDER — SODIUM CHLORIDE 0.9 % IV SOLN
400.0000 mg/m2 | Freq: Once | INTRAVENOUS | Status: AC
  Administered 2022-11-11: 892 mg via INTRAVENOUS
  Filled 2022-11-11: qty 44.6

## 2022-11-11 MED ORDER — SODIUM CHLORIDE 0.9 % IV SOLN
140.0000 mg/m2 | Freq: Once | INTRAVENOUS | Status: AC
  Administered 2022-11-11: 300 mg via INTRAVENOUS
  Filled 2022-11-11: qty 15

## 2022-11-11 MED ORDER — FLUOROURACIL CHEMO INJECTION 2.5 GM/50ML
400.0000 mg/m2 | Freq: Once | INTRAVENOUS | Status: AC
  Administered 2022-11-11: 900 mg via INTRAVENOUS
  Filled 2022-11-11: qty 18

## 2022-11-11 MED ORDER — SODIUM CHLORIDE 0.9 % IV SOLN
2400.0000 mg/m2 | INTRAVENOUS | Status: DC
  Administered 2022-11-11: 5000 mg via INTRAVENOUS
  Filled 2022-11-11: qty 100

## 2022-11-11 MED ORDER — PALONOSETRON HCL INJECTION 0.25 MG/5ML
0.2500 mg | Freq: Once | INTRAVENOUS | Status: AC
  Administered 2022-11-11: 0.25 mg via INTRAVENOUS
  Filled 2022-11-11: qty 5

## 2022-11-11 MED ORDER — ATROPINE SULFATE 1 MG/ML IV SOLN
0.5000 mg | Freq: Once | INTRAVENOUS | Status: AC | PRN
  Administered 2022-11-11: 0.5 mg via INTRAVENOUS
  Filled 2022-11-11: qty 1

## 2022-11-11 MED ORDER — SODIUM CHLORIDE 0.9 % IV SOLN
5.0000 mg/kg | Freq: Once | INTRAVENOUS | Status: AC
  Administered 2022-11-11: 500 mg via INTRAVENOUS
  Filled 2022-11-11: qty 16

## 2022-11-11 NOTE — Patient Instructions (Signed)

## 2022-11-11 NOTE — Patient Instructions (Addendum)
[place] [facility] The chemotherapy medication bag should finish at 46 hours, 96 hours, or 7 days. For example, if your pump is scheduled for 46 hours and it was put on at 4:00 p.m., it should finish at 2:00 p.m. the day it is scheduled to come off regardless of your appointment time.     Estimated time to finish at: 12pm Thursday August 15th, 2024 .   If the display on your pump reads "Low Volume" and it is beeping, take the batteries out of the pump and come to the cancer center for it to be taken off.   If the pump alarms go off prior to the pump reading "Low Volume" then call [phone] and someone can assist you.  If the plunger comes out and the chemotherapy medication is leaking out, please use your home chemo spill kit to clean up the spill. Do NOT use paper towels or other household products.  If you have problems or questions regarding your pump, please call either [phone] (24 hours a day) or the cancer center Monday-Friday 8:00 a.m.- 4:30 p.m. at the clinic number and we will assist you. If you are unable to get assistance, then go to the nearest Emergency Department and ask the staff to contact the IV team for assistance.    Discharge Instructions: Thank you for choosing [facility] to provide your oncology and hematology care.   If you have a lab appointment with the Cancer Center, please go directly to the Cancer Center and check in at the registration area.   Wear comfortable clothing and clothing appropriate for easy access to any Portacath or PICC line.   We strive to give you quality time with your provider. You may need to reschedule your appointment if you arrive late (15 or more minutes).  Arriving late affects you and other patients whose appointments are after yours.  Also, if you miss three or more appointments without notifying the office, you may be dismissed from the clinic at the provider's discretion.      For  prescription refill requests, have your pharmacy contact our office and allow 72 hours for refills to be completed.    Today you received the following chemotherapy and/or immunotherapy agents Avastin, Irinotecan, Leucovorin, Fluorouracil.      To help prevent nausea and vomiting after your treatment, we encourage you to take your nausea medication as directed.  BELOW ARE SYMPTOMS THAT SHOULD BE REPORTED IMMEDIATELY: *FEVER GREATER THAN 100.4 F (38 C) OR HIGHER *CHILLS OR SWEATING *NAUSEA AND VOMITING THAT IS NOT CONTROLLED WITH YOUR NAUSEA MEDICATION *UNUSUAL SHORTNESS OF BREATH *UNUSUAL BRUISING OR BLEEDING *URINARY PROBLEMS (pain or burning when urinating, or frequent urination) *BOWEL PROBLEMS (unusual diarrhea, constipation, pain near the anus) TENDERNESS IN MOUTH AND THROAT WITH OR WITHOUT PRESENCE OF ULCERS (sore throat, sores in mouth, or a toothache) UNUSUAL RASH, SWELLING OR PAIN  UNUSUAL VAGINAL DISCHARGE OR ITCHING   Items with * indicate a potential emergency and should be followed up as soon as possible or go to the Emergency Department if any problems should occur.  Please show the CHEMOTHERAPY ALERT CARD or IMMUNOTHERAPY ALERT CARD at check-in to the Emergency Department and triage nurse.  Should you have questions after your visit or need to cancel or reschedule your appointment, please contact  [place] [facility]  Dept: [phone]  and follow the prompts.  Office hours are 8:00 a.m. to 4:30 p.m. Monday - Friday.  Please note that voicemails left after 4:00 p.m. may not be returned until the following business day.  We are closed weekends and major holidays. You have access to a nurse at all times for urgent questions. Please call the main number to the clinic Dept: [phone] and follow the prompts.   For any non-urgent questions, you may also contact your provider using MyChart. We now offer e-Visits for anyone 55 and older to request care  online for non-urgent symptoms. For details visit [email or web address].   Also download the MyChart app! Go to the app store, search "MyChart", open the app, select [facility], and log in with your MyChart username and password.

## 2022-11-11 NOTE — Progress Notes (Signed)
Patient seen by [name] today  Vitals are within treatment parameters.  Labs reviewed by [name]  CBC diff pending, CMP reviewed.  Urine protein 100  Per physician team, patient is ready for treatment and there are NO modifications to the treatment plan. Per MD Truett Perna, ok to treat with urine protein today.

## 2022-11-11 NOTE — Progress Notes (Signed)
[facility] OFFICE PROGRESS NOTE   Diagnosis: Colon cancer  INTERVAL HISTORY:   [name] returns as scheduled.  Complete another cycle FOLFIRI on 10/27/2022.  Mild nausea relieved with Compazine.  No diarrhea.  The suprapubic cyst is smaller and has less drainage.  He reports discomfort at most posterior left lower tooth when eating.  He believes there may be a sore at the gumline.  He is scheduled to see the dentist next month. Objective:  Vital signs in last 24 hours:  Blood pressure 137/89, pulse 83, temperature 98.1 F (36.7 C), temperature source Oral, resp. rate 18, height 6' (1.829 m), weight 215 lb 1.6 oz (97.6 kg), SpO2 99%.    HEENT: No thrush, 3-4 mm ulcer at the medial gumline at the left posterior molar, no other ulcers.  No thrush Resp: Lungs clear bilaterally Cardio: Regular rate and rhythm GI: Tender, no hepatosplenomegaly, 1-2 cm cystic lesion in the suprapubic region with serosanguineous drainage Vascular: No leg edema   Portacath/PICC-without erythema  Lab Results:  Lab Results  Component Value Date   WBC 11.8 (H) 11/11/2022   HGB 12.8 (L) 11/11/2022   HCT 37.8 (L) 11/11/2022   MCV 100.8 (H) 11/11/2022   PLT 146 (L) 11/11/2022   NEUTROABS PENDING 11/11/2022    CMP  Lab Results  Component Value Date   NA 136 10/27/2022   K 4.3 10/27/2022   CL 101 10/27/2022   CO2 25 10/27/2022   GLUCOSE 204 (H) 10/27/2022   BUN 19 10/27/2022   CREATININE 1.09 10/27/2022   CALCIUM 9.3 10/27/2022   PROT 7.0 10/27/2022   ALBUMIN 3.9 10/27/2022   AST 30 10/27/2022   ALT 26 10/27/2022   ALKPHOS 136 (H) 10/27/2022   BILITOT 0.9 10/27/2022   GFRNONAA >60 10/27/2022   GFRAA 115 02/08/2020    Lab Results  Component Value Date   CEA1 2.00 07/13/2020   CEA 1.98 10/27/2022     Medications: I have reviewed the patient's current medications.   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, status post a left colectomy 03/04/2019 Mass felt to be in the transverse colon on a colonoscopy 01/07/2019 with a biopsy confirming poorly differentiated adenocarcinoma with signet ring cell features, mismatch repair protein expression normal CT abdomen/pelvis 01/08/2019 12.3 cm indeterminate splenic mass, present in 2015 on a lumbar MRI-felt to be benign, small subpleural and pleural-based nodules at the lung bases, status post left nephrectomy CT chest 01/20/2019-small bilateral pulmonary nodules with rounded parenchymal nodules in the right lower lobe, indeterminate splenic mass CT chest 03/18/2019-multiple subcentimeter pulmonary nodules again seen bilaterally, greatest in the lower lobes, without significant change.  No new or enlarging pulmonary nodules or masses.  Partially visualized large heterogeneous splenic mass with no significant change.  Plan for follow-up chest CT at a 55-month interval. Cycle 1 FOLFOX 03/21/2019 Cycle 2 FOLFOX 04/04/2019 Cycle 3 FOLFOX 04/18/2019 (oxaliplatin dose reduced secondary to thrombocytopenia) Cycle 4 FOLFOX 05/02/2019 (oxaliplatin held secondary to thrombocytopenia) Cycle 5 FOLFOX 05/16/2019 Cycle 6 FOLFOX 05/30/2019 (oxaliplatin held secondary to thrombocytopenia) CT chest 06/08/2019-stable scattered small solid pulmonary nodules  Cycle 7 FOLFOX 06/13/2019 Cycle 8 FOLFOX 06/27/2019 Cycle 9 FOLFOX 07/11/2019 Cycle 10 FOLFOX 07/25/2019 (oxaliplatin held due to thrombocytopenia) Cycle 11 FOLFOX 08/08/2019 Cycle 12 FOLFOX 08/22/2019 CTs 01/09/2020-no evidence of recurrent disease, stable lung nodules favored to represent subpleural lymph nodes-dedicated follow-up not recommended CTs 01/11/2021-no evidence of recurrent disease CTs 01/24/2022-no  evidence of recurrent disease CT lumbar spine, abdomen/pelvis 04/11/2022-numerous mixed lytic and sclerotic lesions scattered throughout the visualized axial and appendicular  skeleton including several areas in the lumbar spine.   04/21/2022 CT biopsy sclerotic lesion right anterior iliac bone-consistent with metastatic colorectal adenocarcinoma, moderate to poorly differentiated.  Foundation 1-microsatellite stable, tumor mutation burden 4, K-ras G12S PET scan 05/09/2022-widespread hypermetabolic mixed faintly lytic and sclerotic osseous metastases throughout the axial and proximal appendicular skeleton; associated mild pathologic compression fracture at L3; no hypermetabolic extraosseous metastatic disease. Cycle 1 FOLFIRI/bevacizumab 05/26/2022 Cycle 2 FOLFIRI/bevacizumab 06/09/2022 Cycle 3 FOLFIRI 06/24/2022, bevacizumab 06/26/2022 Cycle 4 FOLFIRI 07/08/2022, bevacizumab held due to proteinuria Cycle 5 FOLFIRI 07/22/2022, bevacizumab held due to proteinuria CTs 07/31/2022-potentially areas of sclerosis represent interval healing; new lytic lesion at T12, multiple new areas of punched-out sclerosis throughout the visualized osseous structures.  Index lesion in the left iliac wing measures 1.3 x 2.8 cm.  Healing of previously metabolically occult metastatic disease on 05/09/2022 is a possibility. Cycle 6 FOLFIRI 08/04/2022, irinotecan dose reduced due to thrombocytopenia, bevacizumab held pending 24-hour urine result Cycle 7 FOLFIRI 08/18/2022, bevacizumab held secondary to proteinuria, G-CSF held Cycle 8 FOLFIRI 09/01/2022, bevacizumab held, G-CSF Cycle 9 FOLFIRI 09/16/2022, bevacizumab held, G-CSF CTs 09/29/2022-enlargement of a hypodense lesion posterior left lobe of the liver, similar diffuse mixed lytic and sclerotic osseous metastatic disease, multiple new small nodules medial right lung base, multiple other small bilateral pulmonary nodules unchanged. 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  Left nephrectomy at age 71 12.  Port-A-Cath placement, Dr. Michaell Cowing, 03/16/2019 13.  Thrombocytopenia secondary to chemotherapy-oxaliplatin dose reduced beginning with cycle 3 FOLFOX, improved 14.  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  Right groin soft tissue infection May 2023-status postsurgical debridement 18.  Left leg DVT-common femoral, femoral, popliteal, posterior tibial, and peroneal veins 19.  02/04/2022-MRI cervical spine-1.7 cm T1 lesion-indeterminate; bone scan 03/14/2022-focal intense activity at approximate T1 level felt to correspond to the lesion on MRI, additional foci of activity involving the lower thoracic and lumbar spine, right iliac bone and pubic symphysis.  CT lumbar spine, abdomen/pelvis 04/11/2022-numerous mixed lytic and sclerotic lesions scattered throughout the visualized axial and appendicular skeleton including several areas in the lumbar spine.  04/21/2022 CT biopsy sclerotic lesion right anterior iliac bone-consistent with metastatic colorectal adenocarcinoma, moderate to poorly differentiated 20.  Inflamed sebaceous cyst 08/18/2022-doxycycline    Disposition: [name] appears stable.  He continues to tolerate the FOLFIRI well.  He will complete another cycle today.  Avastin will be resumed with this cycle.  The suprapubic cyst appears to be improving and the 24-hour urine protein returned at 540 mg on  10/30/2022.  [name] will return for an office visit and chemotherapy in 2 weeks.  He will try Orajel for the gum ulcer.  He deals the ulcers related to a dental apparatus he uses at night.  He will hold  the apparatus for several days.  He will alert his dentist of the tooth pain and the history of Zometa therapy.  Thornton Papas, MD  11/11/2022  9:58 AM

## 2022-11-13 ENCOUNTER — Other Ambulatory Visit: Payer: Self-pay

## 2022-11-13 ENCOUNTER — Inpatient Hospital Stay: Payer: BC Managed Care – PPO

## 2022-11-13 ENCOUNTER — Telehealth: Payer: Self-pay | Admitting: *Deleted

## 2022-11-13 VITALS — BP 134/79 | HR 75 | Temp 98.7°F | Resp 18

## 2022-11-13 DIAGNOSIS — C186 Malignant neoplasm of descending colon: Secondary | ICD-10-CM | POA: Diagnosis not present

## 2022-11-13 DIAGNOSIS — Z5112 Encounter for antineoplastic immunotherapy: Secondary | ICD-10-CM | POA: Diagnosis not present

## 2022-11-13 DIAGNOSIS — C7951 Secondary malignant neoplasm of bone: Secondary | ICD-10-CM | POA: Diagnosis not present

## 2022-11-13 DIAGNOSIS — C787 Secondary malignant neoplasm of liver and intrahepatic bile duct: Secondary | ICD-10-CM | POA: Diagnosis not present

## 2022-11-13 DIAGNOSIS — Z5189 Encounter for other specified aftercare: Secondary | ICD-10-CM | POA: Diagnosis not present

## 2022-11-13 DIAGNOSIS — C61 Malignant neoplasm of prostate: Secondary | ICD-10-CM | POA: Diagnosis not present

## 2022-11-13 DIAGNOSIS — Z5111 Encounter for antineoplastic chemotherapy: Secondary | ICD-10-CM | POA: Diagnosis not present

## 2022-11-13 MED ORDER — SODIUM CHLORIDE 0.9% FLUSH
10.0000 mL | INTRAVENOUS | Status: DC | PRN
  Administered 2022-11-13: 10 mL

## 2022-11-13 MED ORDER — HEPARIN SOD (PORK) LOCK FLUSH 100 UNIT/ML IV SOLN
500.0000 [IU] | Freq: Once | INTRAVENOUS | Status: AC | PRN
  Administered 2022-11-13: 500 [IU]

## 2022-11-13 MED ORDER — PEGFILGRASTIM-CBQV 6 MG/0.6ML ~~LOC~~ SOSY
6.0000 mg | PREFILLED_SYRINGE | Freq: Once | SUBCUTANEOUS | Status: AC
  Administered 2022-11-13: 6 mg via SUBCUTANEOUS
  Filled 2022-11-13: qty 0.6

## 2022-11-13 NOTE — Patient Instructions (Signed)

## 2022-11-13 NOTE — Telephone Encounter (Signed)
Received message from Dr Eldridge Dace that patient will follow with Dr Bjorn Pippin.

## 2022-11-17 ENCOUNTER — Telehealth: Payer: Self-pay | Admitting: Interventional Cardiology

## 2022-11-17 NOTE — Telephone Encounter (Signed)
Left message to call back  

## 2022-11-17 NOTE — Telephone Encounter (Signed)
Pt scheduled to see Dr. Bjorn Pippin on 10/28 - he states that Dr. Eldridge Dace normally does labs 1 week prior to appt. I do not see any orders but did advise the patient that he can go to the [facility] office to get these done. Please place orders   Patient also states that he may be due for an echo and wanted me to follow up on this as well.

## 2022-11-17 NOTE — Telephone Encounter (Signed)
Informed pt that he will not need another echocardiogram soon as he just had one in January. Informed echo would be every year/two under current circumstances.  Aware not sure that he would need any lab work either, but will forward to MD for advisement. Pt aware office will call once Dr. Bjorn Pippin advises. Patient verbalized understanding and agreeable to plan.

## 2022-11-17 NOTE — Telephone Encounter (Signed)
Pt is returning call.  

## 2022-11-18 ENCOUNTER — Other Ambulatory Visit: Payer: Self-pay

## 2022-11-18 NOTE — Telephone Encounter (Signed)
Called patient and let him know Dr. Campbell Lerner recommendations. Patient verbalized understanding.

## 2022-11-18 NOTE — Telephone Encounter (Signed)
Agree, no labs or echo recommended at this time

## 2022-11-23 ENCOUNTER — Other Ambulatory Visit: Payer: Self-pay | Admitting: Oncology

## 2022-11-24 DIAGNOSIS — C186 Malignant neoplasm of descending colon: Secondary | ICD-10-CM | POA: Diagnosis not present

## 2022-11-25 ENCOUNTER — Encounter: Payer: Self-pay | Admitting: *Deleted

## 2022-11-25 ENCOUNTER — Inpatient Hospital Stay: Payer: BC Managed Care – PPO

## 2022-11-25 ENCOUNTER — Inpatient Hospital Stay: Payer: BC Managed Care – PPO | Admitting: Oncology

## 2022-11-25 VITALS — BP 134/82 | HR 85 | Temp 98.1°F | Resp 18 | Ht 72.0 in | Wt 214.6 lb

## 2022-11-25 VITALS — BP 125/87 | HR 82

## 2022-11-25 DIAGNOSIS — C186 Malignant neoplasm of descending colon: Secondary | ICD-10-CM | POA: Diagnosis not present

## 2022-11-25 DIAGNOSIS — C7951 Secondary malignant neoplasm of bone: Secondary | ICD-10-CM | POA: Diagnosis not present

## 2022-11-25 DIAGNOSIS — Z5112 Encounter for antineoplastic immunotherapy: Secondary | ICD-10-CM | POA: Diagnosis not present

## 2022-11-25 DIAGNOSIS — C61 Malignant neoplasm of prostate: Secondary | ICD-10-CM | POA: Diagnosis not present

## 2022-11-25 DIAGNOSIS — C787 Secondary malignant neoplasm of liver and intrahepatic bile duct: Secondary | ICD-10-CM | POA: Diagnosis not present

## 2022-11-25 DIAGNOSIS — Z5111 Encounter for antineoplastic chemotherapy: Secondary | ICD-10-CM | POA: Diagnosis not present

## 2022-11-25 DIAGNOSIS — Z5189 Encounter for other specified aftercare: Secondary | ICD-10-CM | POA: Diagnosis not present

## 2022-11-25 LAB — CMP (CANCER CENTER ONLY)
ALT: 14 U/L (ref 0–44)
AST: 17 U/L (ref 15–41)
Albumin: 3.8 g/dL (ref 3.5–5.0)
Alkaline Phosphatase: 236 U/L — ABNORMAL HIGH (ref 38–126)
Anion gap: 8 (ref 5–15)
BUN: 20 mg/dL (ref 6–20)
CO2: 26 mmol/L (ref 22–32)
Calcium: 9.2 mg/dL (ref 8.9–10.3)
Chloride: 99 mmol/L (ref 98–111)
Creatinine: 1.02 mg/dL (ref 0.61–1.24)
GFR, Estimated: >60 mL/min (ref >60)
Glucose, Bld: 201 mg/dL — ABNORMAL HIGH (ref 70–99)
Potassium: 4.6 mmol/L (ref 3.5–5.1)
Sodium: 133 mmol/L — ABNORMAL LOW (ref 135–145)
Total Bilirubin: 0.6 mg/dL (ref 0.3–1.2)
Total Protein: 6.7 g/dL (ref 6.5–8.1)

## 2022-11-25 LAB — CBC WITH DIFFERENTIAL (CANCER CENTER ONLY)
Abs Immature Granulocytes: 1.03 10*3/uL — ABNORMAL HIGH (ref 0.00–0.07)
Basophils Absolute: 0.1 10*3/uL (ref 0.0–0.1)
Basophils Relative: 1 %
Eosinophils Absolute: 0.5 10*3/uL (ref 0.0–0.5)
Eosinophils Relative: 4 %
HCT: 37.6 % — ABNORMAL LOW (ref 39.0–52.0)
Hemoglobin: 12.7 g/dL — ABNORMAL LOW (ref 13.0–17.0)
Immature Granulocytes: 10 %
Lymphocytes Relative: 10 %
Lymphs Abs: 1.1 10*3/uL (ref 0.7–4.0)
MCH: 34.1 pg — ABNORMAL HIGH (ref 26.0–34.0)
MCHC: 33.8 g/dL (ref 30.0–36.0)
MCV: 101.1 fL — ABNORMAL HIGH (ref 80.0–100.0)
Monocytes Absolute: 1.1 10*3/uL — ABNORMAL HIGH (ref 0.1–1.0)
Monocytes Relative: 10 %
Neutro Abs: 7 10*3/uL (ref 1.7–7.7)
Neutrophils Relative %: 65 %
Platelet Count: 134 10*3/uL — ABNORMAL LOW (ref 150–400)
RBC: 3.72 MIL/uL — ABNORMAL LOW (ref 4.22–5.81)
RDW: 13.8 % (ref 11.5–15.5)
WBC Count: 10.8 10*3/uL — ABNORMAL HIGH (ref 4.0–10.5)
nRBC: 0 % (ref 0.0–0.2)

## 2022-11-25 LAB — CEA (ACCESS): CEA (CHCC): 5.55 ng/mL — ABNORMAL HIGH (ref 0.00–5.00)

## 2022-11-25 LAB — TOTAL PROTEIN, URINE DIPSTICK: Protein, ur: 100 mg/dL — AB

## 2022-11-25 MED ORDER — SODIUM CHLORIDE 0.9 % IV SOLN
10.0000 mg | Freq: Once | INTRAVENOUS | Status: AC
  Administered 2022-11-25: 10 mg via INTRAVENOUS
  Filled 2022-11-25: qty 10

## 2022-11-25 MED ORDER — SODIUM CHLORIDE 0.9 % IV SOLN
400.0000 mg/m2 | Freq: Once | INTRAVENOUS | Status: AC
  Administered 2022-11-25: 892 mg via INTRAVENOUS
  Filled 2022-11-25: qty 44.6

## 2022-11-25 MED ORDER — PALONOSETRON HCL INJECTION 0.25 MG/5ML
0.2500 mg | Freq: Once | INTRAVENOUS | Status: AC
  Administered 2022-11-25: 0.25 mg via INTRAVENOUS
  Filled 2022-11-25: qty 5

## 2022-11-25 MED ORDER — ATROPINE SULFATE 1 MG/ML IV SOLN
0.5000 mg | Freq: Once | INTRAVENOUS | Status: AC | PRN
  Administered 2022-11-25: 0.5 mg via INTRAVENOUS
  Filled 2022-11-25: qty 1

## 2022-11-25 MED ORDER — SODIUM CHLORIDE 0.9 % IV SOLN
5.0000 mg/kg | Freq: Once | INTRAVENOUS | Status: AC
  Administered 2022-11-25: 500 mg via INTRAVENOUS
  Filled 2022-11-25: qty 16

## 2022-11-25 MED ORDER — FLUOROURACIL CHEMO INJECTION 2.5 GM/50ML
400.0000 mg/m2 | Freq: Once | INTRAVENOUS | Status: AC
  Administered 2022-11-25: 900 mg via INTRAVENOUS
  Filled 2022-11-25: qty 18

## 2022-11-25 MED ORDER — TRAMADOL HCL 50 MG PO TABS: 50.0000 mg | ORAL_TABLET | Freq: Four times a day (QID) | ORAL | 0 refills | Status: DC | PRN

## 2022-11-25 MED ORDER — SODIUM CHLORIDE 0.9 % IV SOLN: Freq: Once | INTRAVENOUS | Status: AC

## 2022-11-25 MED ORDER — SODIUM CHLORIDE 0.9 % IV SOLN
140.0000 mg/m2 | Freq: Once | INTRAVENOUS | Status: AC
  Administered 2022-11-25: 300 mg via INTRAVENOUS
  Filled 2022-11-25: qty 15

## 2022-11-25 MED ORDER — SODIUM CHLORIDE 0.9 % IV SOLN
2400.0000 mg/m2 | INTRAVENOUS | Status: DC
  Administered 2022-11-25: 5000 mg via INTRAVENOUS
  Filled 2022-11-25: qty 100

## 2022-11-25 NOTE — Patient Instructions (Addendum)
[facility]  The chemotherapy medication bag should finish at 46 hours, 96 hours, or 7 days. For example, if your pump is scheduled for 46 hours and it was put on at 4:00 p.m., it should finish at 2:00 p.m. the day it is scheduled to come off regardless of your appointment time.     Estimated time to finish at 1:15 Thursday November 27, 2022.   If the display on your pump reads "Low Volume" and it is beeping, take the batteries out of the pump and come to the cancer center for it to be taken off.   If the pump alarms go off prior to the pump reading "Low Volume" then call [phone] and someone can assist you.  If the plunger comes out and the chemotherapy medication is leaking out, please use your home chemo spill kit to clean up the spill. Do NOT use paper towels or other household products.  If you have problems or questions regarding your pump, please call either [phone] (24 hours a day) or the cancer center Monday-Friday 8:00 a.m.- 4:30 p.m. at the clinic number and we will assist you. If you are unable to get assistance, then go to the nearest Emergency Department and ask the staff to contact the IV team for assistance.   Discharge Instructions: Thank you for choosing [facility] to provide your oncology and hematology care.   If you have a lab appointment with the Cancer Center, please go directly to the Cancer Center and check in at the registration area.   Wear comfortable clothing and clothing appropriate for easy access to any Portacath or PICC line.   We strive to give you quality time with your provider. You may need to reschedule your appointment if you arrive late (15 or more minutes).  Arriving late affects you and other patients whose appointments are after yours.  Also, if you miss three or more appointments without notifying the office, you may be dismissed from the clinic at the provider's discretion.      For  prescription refill requests, have your pharmacy contact our office and allow 72 hours for refills to be completed.    Today you received the following chemotherapy and/or immunotherapy agents Avastin, Irinotecan, Leucovorin, Fluorouracil.      To help prevent nausea and vomiting after your treatment, we encourage you to take your nausea medication as directed.  BELOW ARE SYMPTOMS THAT SHOULD BE REPORTED IMMEDIATELY: *FEVER GREATER THAN 100.4 F (38 C) OR HIGHER *CHILLS OR SWEATING *NAUSEA AND VOMITING THAT IS NOT CONTROLLED WITH YOUR NAUSEA MEDICATION *UNUSUAL SHORTNESS OF BREATH *UNUSUAL BRUISING OR BLEEDING *URINARY PROBLEMS (pain or burning when urinating, or frequent urination) *BOWEL PROBLEMS (unusual diarrhea, constipation, pain near the anus) TENDERNESS IN MOUTH AND THROAT WITH OR WITHOUT PRESENCE OF ULCERS (sore throat, sores in mouth, or a toothache) UNUSUAL RASH, SWELLING OR PAIN  UNUSUAL VAGINAL DISCHARGE OR ITCHING   Items with * indicate a potential emergency and should be followed up as soon as possible or go to the Emergency Department if any problems should occur.  Please show the CHEMOTHERAPY ALERT CARD or IMMUNOTHERAPY ALERT CARD at check-in to the Emergency Department and triage nurse.  Should you have questions after your visit or need to cancel or reschedule your appointment, please contact [facility]  Dept: [phone]  and follow the prompts.  Office hours are 8:00 a.m. to 4:30 p.m. Monday - Friday. Please  note that voicemails left after 4:00 p.m. may not be returned until the following business day.  We are closed weekends and major holidays. You have access to a nurse at all times for urgent questions. Please call the main number to the clinic Dept: [phone] and follow the prompts.   For any non-urgent questions, you may also contact your provider using MyChart. We now offer e-Visits for anyone 68 and older to request care  online for non-urgent symptoms. For details visit [email or web address].   Also download the MyChart app! Go to the app store, search "MyChart", open the app, select [facility], and log in with your MyChart username and password.  Bevacizumab Injection What is this medication? BEVACIZUMAB (be va SIZ yoo mab) treats some types of cancer. It works by blocking a protein that causes cancer cells to grow and multiply. This helps to slow or stop the spread of cancer cells. It is a monoclonal antibody. This medicine may be used for other purposes; ask your health care provider or pharmacist if you have questions. COMMON BRAND NAME(S): Alymsys, Avastin, MVASI, Omer Jack What should I tell my care team before I take this medication? They need to know if you have any of these conditions: Blood clots Coughing up blood Having or recent surgery Heart failure High blood pressure History of a connection between 2 or more body parts that do not usually connect (fistula) History of a tear in your stomach or intestines Protein in your urine An unusual or allergic reaction to bevacizumab, other medications, foods, dyes, or preservatives Pregnant or trying to get pregnant Breast-feeding How should I use this medication? This medication is injected into a vein. It is given by your care team in a hospital or clinic setting. Talk to your care team the use of this medication in children. Special care may be needed. Overdosage: If you think you have taken too much of this medicine contact a poison control center or emergency room at once. NOTE: This medicine is only for you. Do not share this medicine with others. What if I miss a dose? Keep appointments for follow-up doses. It is important not to miss your dose. Call your care team if you are unable to keep an appointment. What may interact with this medication? Interactions are not expected. This list may not describe all possible interactions. Give your  health care provider a list of all the medicines, herbs, non-prescription drugs, or dietary supplements you use. Also tell them if you smoke, drink alcohol, or use illegal drugs. Some items may interact with your medicine. What should I watch for while using this medication? Your condition will be monitored carefully while you are receiving this medication. You may need blood work while taking this medication. This medication may make you feel generally unwell. This is not uncommon as chemotherapy can affect healthy cells as well as cancer cells. Report any side effects. Continue your course of treatment even though you feel ill unless your care team tells you to stop. This medication may increase your risk to bruise or bleed. Call your care team if you notice any unusual bleeding. Before having surgery, talk to your care team to make sure it is ok. This medication can increase the risk of poor healing of your surgical site or wound. You will need to stop this medication for 28 days before surgery. After surgery, wait at least 28 days before restarting this medication. Make sure the surgical site or wound is healed enough before restarting  this medication. Talk to your care team if questions. Talk to your care team if you may be pregnant. Serious birth defects can occur if you take this medication during pregnancy and for 6 months after the last dose. Contraception is recommended while taking this medication and for 6 months after the last dose. Your care team can help you find the option that works for you. Do not breastfeed while taking this medication and for 6 months after the last dose. This medication can cause infertility. Talk to your care team if you are concerned about your fertility. What side effects may I notice from receiving this medication? Side effects that you should report to your care team as soon as possible: Allergic reactions--skin rash, itching, hives, swelling of the face, lips,  tongue, or throat Bleeding--bloody or black, tar-like stools, vomiting blood or brown material that looks like coffee grounds, red or dark brown urine, small red or purple spots on skin, unusual bruising or bleeding Blood clot--pain, swelling, or warmth in the leg, shortness of breath, chest pain Heart attack--pain or tightness in the chest, shoulders, arms, or jaw, nausea, shortness of breath, cold or clammy skin, feeling faint or lightheaded Heart failure--shortness of breath, swelling of the ankles, feet, or hands, sudden weight gain, unusual weakness or fatigue Increase in blood pressure Infection--fever, chills, cough, sore throat, wounds that don't heal, pain or trouble when passing urine, general feeling of discomfort or being unwell Infusion reactions--chest pain, shortness of breath or trouble breathing, feeling faint or lightheaded Kidney injury--decrease in the amount of urine, swelling of the ankles, hands, or feet Stomach pain that is severe, does not go away, or gets worse Stroke--sudden numbness or weakness of the face, arm, or leg, trouble speaking, confusion, trouble walking, loss of balance or coordination, dizziness, severe headache, change in vision Sudden and severe headache, confusion, change in vision, seizures, which may be signs of posterior reversible encephalopathy syndrome (PRES) Side effects that usually do not require medical attention (report to your care team if they continue or are bothersome): Back pain Change in taste Diarrhea Dry skin Increased tears Nosebleed This list may not describe all possible side effects. Call your doctor for medical advice about side effects. You may report side effects to FDA at 1-800-FDA-1088. Where should I keep my medication? This medication is given in a hospital or clinic. It will not be stored at home. NOTE: This sheet is a summary. It may not cover all possible information. If you have questions about this medicine, talk to  your doctor, pharmacist, or health care provider.  2024 Elsevier/Gold Standard (2021-08-02 00:00:00) Irinotecan Injection What is this medication? IRINOTECAN (ir in oh TEE kan) treats some types of cancer. It works by slowing down the growth of cancer cells. This medicine may be used for other purposes; ask your health care provider or pharmacist if you have questions. COMMON BRAND NAME(S): Camptosar What should I tell my care team before I take this medication? They need to know if you have any of these conditions: Dehydration Diarrhea Infection, especially a viral infection, such as chickenpox, cold sores, herpes Liver disease Low blood cell levels (white cells, red cells, and platelets) Low levels of electrolytes, such as calcium, magnesium, or potassium in your blood Recent or ongoing radiation An unusual or allergic reaction to irinotecan, other medications, foods, dyes, or preservatives If you or your partner are pregnant or trying to get pregnant Breast-feeding How should I use this medication? This medication is injected into  a vein. It is given by your care team in a hospital or clinic setting. Talk to your care team about the use of this medication in children. Special care may be needed. Overdosage: If you think you have taken too much of this medicine contact a poison control center or emergency room at once. NOTE: This medicine is only for you. Do not share this medicine with others. What if I miss a dose? Keep appointments for follow-up doses. It is important not to miss your dose. Call your care team if you are unable to keep an appointment. What may interact with this medication? Do not take this medication with any of the following: Cobicistat Itraconazole This medication may also interact with the following: Certain antibiotics, such as clarithromycin, rifampin, rifabutin Certain antivirals for HIV or AIDS Certain medications for fungal infections, such as  ketoconazole, posaconazole, voriconazole Certain medications for seizures, such as carbamazepine, phenobarbital, phenytoin Gemfibrozil Nefazodone St. John's wort This list may not describe all possible interactions. Give your health care provider a list of all the medicines, herbs, non-prescription drugs, or dietary supplements you use. Also tell them if you smoke, drink alcohol, or use illegal drugs. Some items may interact with your medicine. What should I watch for while using this medication? Your condition will be monitored carefully while you are receiving this medication. You may need blood work while taking this medication. This medication may make you feel generally unwell. This is not uncommon as chemotherapy can affect healthy cells as well as cancer cells. Report any side effects. Continue your course of treatment even though you feel ill unless your care team tells you to stop. This medication can cause serious side effects. To reduce the risk, your care team may give you other medications to take before receiving this one. Be sure to follow the directions from your care team. This medication may affect your coordination, reaction time, or judgement. Do not drive or operate machinery until you know how this medication affects you. Sit up or stand slowly to reduce the risk of dizzy or fainting spells. Drinking alcohol with this medication can increase the risk of these side effects. This medication may increase your risk of getting an infection. Call your care team for advice if you get a fever, chills, sore throat, or other symptoms of a cold or flu. Do not treat yourself. Try to avoid being around people who are sick. Avoid taking medications that contain aspirin, acetaminophen, ibuprofen, naproxen, or ketoprofen unless instructed by your care team. These medications may hide a fever. This medication may increase your risk to bruise or bleed. Call your care team if you notice any unusual  bleeding. Be careful brushing or flossing your teeth or using a toothpick because you may get an infection or bleed more easily. If you have any dental work done, tell your dentist you are receiving this medication. Talk to your care team if you or your partner are pregnant or think either of you might be pregnant. This medication can cause serious birth defects if taken during pregnancy and for 6 months after the last dose. You will need a negative pregnancy test before starting this medication. Contraception is recommended while taking this medication and for 6 months after the last dose. Your care team can help you find the option that works for you. Do not father a child while taking this medication and for 3 months after the last dose. Use a condom for contraception during this time period.  Do not breastfeed while taking this medication and for 7 days after the last dose. This medication may cause infertility. Talk to your care team if you are concerned about your fertility. What side effects may I notice from receiving this medication? Side effects that you should report to your care team as soon as possible: Allergic reactions--skin rash, itching, hives, swelling of the face, lips, tongue, or throat Dry cough, shortness of breath or trouble breathing Increased saliva or tears, increased sweating, stomach cramping, diarrhea, small pupils, unusual weakness or fatigue, slow heartbeat Infection--fever, chills, cough, sore throat, wounds that don't heal, pain or trouble when passing urine, general feeling of discomfort or being unwell Kidney injury--decrease in the amount of urine, swelling of the ankles, hands, or feet Low red blood cell level--unusual weakness or fatigue, dizziness, headache, trouble breathing Severe or prolonged diarrhea Unusual bruising or bleeding Side effects that usually do not require medical attention (report to your care team if they continue or are  bothersome): Constipation Diarrhea Hair loss Loss of appetite Nausea Stomach pain This list may not describe all possible side effects. Call your doctor for medical advice about side effects. You may report side effects to FDA at 1-800-FDA-1088. Where should I keep my medication? This medication is given in a hospital or clinic. It will not be stored at home. NOTE: This sheet is a summary. It may not cover all possible information. If you have questions about this medicine, talk to your doctor, pharmacist, or health care provider.  2024 Elsevier/Gold Standard (2021-07-29 00:00:00) Leucovorin Injection What is this medication? LEUCOVORIN (loo koe VOR in) prevents side effects from certain medications, such as methotrexate. It works by increasing folate levels. This helps protect healthy cells in your body. It may also be used to treat anemia caused by low levels of folate. It can also be used with fluorouracil, a type of chemotherapy, to treat colorectal cancer. It works by increasing the effects of fluorouracil in the body. This medicine may be used for other purposes; ask your health care provider or pharmacist if you have questions. What should I tell my care team before I take this medication? They need to know if you have any of these conditions: Anemia from low levels of vitamin B12 in the blood An unusual or allergic reaction to leucovorin, folic acid, other medications, foods, dyes, or preservatives Pregnant or trying to get pregnant Breastfeeding How should I use this medication? This medication is injected into a vein or a muscle. It is given by your care team in a hospital or clinic setting. Talk to your care team about the use of this medication in children. Special care may be needed. Overdosage: If you think you have taken too much of this medicine contact a poison control center or emergency room at once. NOTE: This medicine is only for you. Do not share this medicine with  others. What if I miss a dose? Keep appointments for follow-up doses. It is important not to miss your dose. Call your care team if you are unable to keep an appointment. What may interact with this medication? Capecitabine Fluorouracil Phenobarbital Phenytoin Primidone Trimethoprim;sulfamethoxazole This list may not describe all possible interactions. Give your health care provider a list of all the medicines, herbs, non-prescription drugs, or dietary supplements you use. Also tell them if you smoke, drink alcohol, or use illegal drugs. Some items may interact with your medicine. What should I watch for while using this medication? Your condition will be monitored  carefully while you are receiving this medication. This medication may increase the side effects of 5-fluorouracil. Tell your care team if you have diarrhea or mouth sores that do not get better or that get worse. What side effects may I notice from receiving this medication? Side effects that you should report to your care team as soon as possible: Allergic reactions--skin rash, itching, hives, swelling of the face, lips, tongue, or throat This list may not describe all possible side effects. Call your doctor for medical advice about side effects. You may report side effects to FDA at 1-800-FDA-1088. Where should I keep my medication? This medication is given in a hospital or clinic. It will not be stored at home. NOTE: This sheet is a summary. It may not cover all possible information. If you have questions about this medicine, talk to your doctor, pharmacist, or health care provider.  2024 Elsevier/Gold Standard (2021-08-20 00:00:00)  Fluorouracil Injection What is this medication? FLUOROURACIL (flure oh YOOR a sil) treats some types of cancer. It works by slowing down the growth of cancer cells. This medicine may be used for other purposes; ask your health care provider or pharmacist if you have questions. COMMON BRAND  NAME(S): Adrucil What should I tell my care team before I take this medication? They need to know if you have any of these conditions: Blood disorders Dihydropyrimidine dehydrogenase (DPD) deficiency Infection, such as chickenpox, cold sores, herpes Kidney disease Liver disease Poor nutrition Recent or ongoing radiation therapy An unusual or allergic reaction to fluorouracil, other medications, foods, dyes, or preservatives If you or your partner are pregnant or trying to get pregnant Breast-feeding How should I use this medication? This medication is injected into a vein. It is administered by your care team in a hospital or clinic setting. Talk to your care team about the use of this medication in children. Special care may be needed. Overdosage: If you think you have taken too much of this medicine contact a poison control center or emergency room at once. NOTE: This medicine is only for you. Do not share this medicine with others. What if I miss a dose? Keep appointments for follow-up doses. It is important not to miss your dose. Call your care team if you are unable to keep an appointment. What may interact with this medication? Do not take this medication with any of the following: Live virus vaccines This medication may also interact with the following: Medications that treat or prevent blood clots, such as warfarin, enoxaparin, dalteparin This list may not describe all possible interactions. Give your health care provider a list of all the medicines, herbs, non-prescription drugs, or dietary supplements you use. Also tell them if you smoke, drink alcohol, or use illegal drugs. Some items may interact with your medicine. What should I watch for while using this medication? Your condition will be monitored carefully while you are receiving this medication. This medication may make you feel generally unwell. This is not uncommon as chemotherapy can affect healthy cells as well as  cancer cells. Report any side effects. Continue your course of treatment even though you feel ill unless your care team tells you to stop. In some cases, you may be given additional medications to help with side effects. Follow all directions for their use. This medication may increase your risk of getting an infection. Call your care team for advice if you get a fever, chills, sore throat, or other symptoms of a cold or flu. Do not treat  yourself. Try to avoid being around people who are sick. This medication may increase your risk to bruise or bleed. Call your care team if you notice any unusual bleeding. Be careful brushing or flossing your teeth or using a toothpick because you may get an infection or bleed more easily. If you have any dental work done, tell your dentist you are receiving this medication. Avoid taking medications that contain aspirin, acetaminophen, ibuprofen, naproxen, or ketoprofen unless instructed by your care team. These medications may hide a fever. Do not treat diarrhea with over the counter products. Contact your care team if you have diarrhea that lasts more than 2 days or if it is severe and watery. This medication can make you more sensitive to the sun. Keep out of the sun. If you cannot avoid being in the sun, wear protective clothing and sunscreen. Do not use sun lamps, tanning beds, or tanning booths. Talk to your care team if you or your partner wish to become pregnant or think you might be pregnant. This medication can cause serious birth defects if taken during pregnancy and for 3 months after the last dose. A reliable form of contraception is recommended while taking this medication and for 3 months after the last dose. Talk to your care team about effective forms of contraception. Do not father a child while taking this medication and for 3 months after the last dose. Use a condom while having sex during this time period. Do not breastfeed while taking this  medication. This medication may cause infertility. Talk to your care team if you are concerned about your fertility. What side effects may I notice from receiving this medication? Side effects that you should report to your care team as soon as possible: Allergic reactions--skin rash, itching, hives, swelling of the face, lips, tongue, or throat Heart attack--pain or tightness in the chest, shoulders, arms, or jaw, nausea, shortness of breath, cold or clammy skin, feeling faint or lightheaded Heart failure--shortness of breath, swelling of the ankles, feet, or hands, sudden weight gain, unusual weakness or fatigue Heart rhythm changes--fast or irregular heartbeat, dizziness, feeling faint or lightheaded, chest pain, trouble breathing High ammonia level--unusual weakness or fatigue, confusion, loss of appetite, nausea, vomiting, seizures Infection--fever, chills, cough, sore throat, wounds that don't heal, pain or trouble when passing urine, general feeling of discomfort or being unwell Low red blood cell level--unusual weakness or fatigue, dizziness, headache, trouble breathing Pain, tingling, or numbness in the hands or feet, muscle weakness, change in vision, confusion or trouble speaking, loss of balance or coordination, trouble walking, seizures Redness, swelling, and blistering of the skin over hands and feet Severe or prolonged diarrhea Unusual bruising or bleeding Side effects that usually do not require medical attention (report to your care team if they continue or are bothersome): Dry skin Headache Increased tears Nausea Pain, redness, or swelling with sores inside the mouth or throat Sensitivity to light Vomiting This list may not describe all possible side effects. Call your doctor for medical advice about side effects. You may report side effects to FDA at 1-800-FDA-1088. Where should I keep my medication? This medication is given in a hospital or clinic. It will not be stored  at home. NOTE: This sheet is a summary. It may not cover all possible information. If you have questions about this medicine, talk to your doctor, pharmacist, or health care provider.  2024 Elsevier/Gold Standard (2021-07-23 00:00:00)

## 2022-11-25 NOTE — Progress Notes (Signed)
Patient seen by [name] today  Vitals are within treatment parameters.OK to proceed w/BP 134/82  Labs reviewed by [name] and are not all within treatment parameters. OK to treat w/urine protein 100  Per physician team, patient is ready for treatment and there are NO modifications to the treatment plan.

## 2022-11-25 NOTE — Progress Notes (Signed)
[name] OFFICE PROGRESS NOTE   Diagnosis: Colon cancer  INTERVAL HISTORY:   [name] completed all cycle of FOLFIRI/bevacizumab 11/11/2022.  No nausea/vomiting or diarrhea.  He reports the soreness at the left posterior tooth gum has improved.  He continues to have drainage from the suprapubic cyst.  He developed bone pain several days after receiving G-CSF.  The pain resolved after a few days and then returned at the end of last week. Objective:  Vital signs in last 24 hours:  Blood pressure 134/82, pulse 85, temperature 98.1 F (36.7 C), temperature source Oral, resp. rate 18, height 6' (1.829 m), weight 214 lb 9.6 oz (97.3 kg), SpO2 98%.    HEENT: No thrush or ulcers. Resp: Lungs clear bilaterally Cardio: Regular rate and rhythm GI: No hepatosplenomegaly, suprapubic cystic area with bloody drainage Vascular: Venous engorgement of the left lower leg  Skin: Palms without erythema  Portacath/PICC-without erythema  Lab Results:  Lab Results  Component Value Date   WBC 10.8 (H) 11/25/2022   HGB 12.7 (L) 11/25/2022   HCT 37.6 (L) 11/25/2022   MCV 101.1 (H) 11/25/2022   PLT 134 (L) 11/25/2022   NEUTROABS 7.0 11/25/2022    CMP  Lab Results  Component Value Date   NA 135 11/11/2022   K 4.4 11/11/2022   CL 102 11/11/2022   CO2 25 11/11/2022   GLUCOSE 164 (H) 11/11/2022   BUN 17 11/11/2022   CREATININE 0.99 11/11/2022   CALCIUM 9.3 11/11/2022   PROT 6.9 11/11/2022   ALBUMIN 3.8 11/11/2022   AST 15 11/11/2022   ALT 14 11/11/2022   ALKPHOS 190 (H) 11/11/2022   BILITOT 0.7 11/11/2022   GFRNONAA >60 11/11/2022   GFRAA 115 02/08/2020    Lab Results  Component Value Date   CEA1 2.00 07/13/2020   CEA 1.98 10/27/2022    Medications: I have reviewed the patient's current medications.   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,  status post a left colectomy 03/04/2019 Mass felt to be in the transverse colon on a colonoscopy 01/07/2019 with a biopsy confirming poorly differentiated adenocarcinoma with signet ring cell features, mismatch repair protein expression normal CT abdomen/pelvis 01/08/2019 12.3 cm indeterminate splenic mass, present in 2015 on a lumbar MRI-felt to be benign, small subpleural and pleural-based nodules at the lung bases, status post left nephrectomy CT chest 01/20/2019-small bilateral pulmonary nodules with rounded parenchymal nodules in the right lower lobe, indeterminate splenic mass CT chest 03/18/2019-multiple subcentimeter pulmonary nodules again seen bilaterally, greatest in the lower lobes, without significant change.  No new or enlarging pulmonary nodules or masses.  Partially visualized large heterogeneous splenic mass with no significant change.  Plan for follow-up chest CT at a 59-month interval. Cycle 1 FOLFOX 03/21/2019 Cycle 2 FOLFOX 04/04/2019 Cycle 3 FOLFOX 04/18/2019 (oxaliplatin dose reduced secondary to thrombocytopenia) Cycle 4 FOLFOX 05/02/2019 (oxaliplatin held secondary to thrombocytopenia) Cycle 5 FOLFOX 05/16/2019 Cycle 6 FOLFOX 05/30/2019 (oxaliplatin held secondary to thrombocytopenia) CT chest 06/08/2019-stable scattered small solid pulmonary nodules  Cycle 7 FOLFOX 06/13/2019 Cycle 8 FOLFOX 06/27/2019 Cycle 9 FOLFOX 07/11/2019 Cycle 10 FOLFOX 07/25/2019 (oxaliplatin held due to thrombocytopenia) Cycle 11 FOLFOX 08/08/2019 Cycle 12 FOLFOX 08/22/2019 CTs 01/09/2020-no evidence of recurrent disease, stable lung nodules favored to represent subpleural lymph nodes-dedicated follow-up not recommended CTs 01/11/2021-no evidence of recurrent disease CTs 01/24/2022-no evidence of recurrent disease CT lumbar spine, abdomen/pelvis 04/11/2022-numerous mixed lytic and sclerotic lesions scattered  throughout the visualized axial and appendicular skeleton including several areas in the lumbar spine.    04/21/2022 CT biopsy sclerotic lesion right anterior iliac bone-consistent with metastatic colorectal adenocarcinoma, moderate to poorly differentiated.  Foundation 1-microsatellite stable, tumor mutation burden 4, K-ras G12S PET scan 05/09/2022-widespread hypermetabolic mixed faintly lytic and sclerotic osseous metastases throughout the axial and proximal appendicular skeleton; associated mild pathologic compression fracture at L3; no hypermetabolic extraosseous metastatic disease. Cycle 1 FOLFIRI/bevacizumab 05/26/2022 Cycle 2 FOLFIRI/bevacizumab 06/09/2022 Cycle 3 FOLFIRI 06/24/2022, bevacizumab 06/26/2022 Cycle 4 FOLFIRI 07/08/2022, bevacizumab held due to proteinuria Cycle 5 FOLFIRI 07/22/2022, bevacizumab held due to proteinuria CTs 07/31/2022-potentially areas of sclerosis represent interval healing; new lytic lesion at T12, multiple new areas of punched-out sclerosis throughout the visualized osseous structures.  Index lesion in the left iliac wing measures 1.3 x 2.8 cm.  Healing of previously metabolically occult metastatic disease on 05/09/2022 is a possibility. Cycle 6 FOLFIRI 08/04/2022, irinotecan dose reduced due to thrombocytopenia, bevacizumab held pending 24-hour urine result Cycle 7 FOLFIRI 08/18/2022, bevacizumab held secondary to proteinuria, G-CSF held Cycle 8 FOLFIRI 09/01/2022, bevacizumab held, G-CSF Cycle 9 FOLFIRI 09/16/2022, bevacizumab held, G-CSF CTs 09/29/2022-enlargement of a hypodense lesion posterior left lobe of the liver, similar diffuse mixed lytic and sclerotic osseous metastatic disease, multiple new small nodules medial right lung base, multiple other small bilateral pulmonary nodules unchanged. 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  Left nephrectomy at age 18 12.  Port-A-Cath placement, Dr. Michaell Cowing, 03/16/2019 13.  Thrombocytopenia secondary to chemotherapy-oxaliplatin dose reduced beginning with cycle 3 FOLFOX, improved 14.  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  Right groin soft tissue infection May 2023-status postsurgical debridement 18.  Left leg DVT-common femoral, femoral, popliteal, posterior tibial, and peroneal veins 19.  02/04/2022-MRI cervical spine-1.7 cm T1 lesion-indeterminate; bone scan 03/14/2022-focal intense activity at approximate T1 level felt to correspond to the lesion on MRI, additional foci of activity involving the lower thoracic and lumbar spine, right iliac bone and pubic symphysis.  CT lumbar spine, abdomen/pelvis 04/11/2022-numerous mixed lytic and sclerotic lesions scattered throughout the visualized axial and appendicular skeleton including several areas in the lumbar spine.  04/21/2022 CT biopsy sclerotic lesion right anterior iliac bone-consistent with metastatic colorectal adenocarcinoma, moderate to poorly differentiated 20.  Inflamed sebaceous cyst 08/18/2022-doxycycline    Disposition: [name] appears stable.  He will complete another cycle of FOLFIRI/Avastin today.  He will undergo restaging CTs after this cycle.  He will use tramadol as needed for pain associated with G-CSF.  [name] will return for an office visit in 2 weeks.  Thornton Papas, MD  11/25/2022  11:21  AM

## 2022-11-27 ENCOUNTER — Inpatient Hospital Stay: Payer: BC Managed Care – PPO

## 2022-11-27 VITALS — BP 131/90 | HR 79 | Temp 97.9°F | Resp 18

## 2022-11-27 DIAGNOSIS — C186 Malignant neoplasm of descending colon: Secondary | ICD-10-CM

## 2022-11-27 DIAGNOSIS — C61 Malignant neoplasm of prostate: Secondary | ICD-10-CM | POA: Diagnosis not present

## 2022-11-27 DIAGNOSIS — Z5111 Encounter for antineoplastic chemotherapy: Secondary | ICD-10-CM | POA: Diagnosis not present

## 2022-11-27 DIAGNOSIS — Z5112 Encounter for antineoplastic immunotherapy: Secondary | ICD-10-CM | POA: Diagnosis not present

## 2022-11-27 DIAGNOSIS — C7951 Secondary malignant neoplasm of bone: Secondary | ICD-10-CM | POA: Diagnosis not present

## 2022-11-27 DIAGNOSIS — Z5189 Encounter for other specified aftercare: Secondary | ICD-10-CM | POA: Diagnosis not present

## 2022-11-27 DIAGNOSIS — C787 Secondary malignant neoplasm of liver and intrahepatic bile duct: Secondary | ICD-10-CM | POA: Diagnosis not present

## 2022-11-27 MED ORDER — HEPARIN SOD (PORK) LOCK FLUSH 100 UNIT/ML IV SOLN
500.0000 [IU] | Freq: Once | INTRAVENOUS | Status: AC | PRN
  Administered 2022-11-27: 500 [IU]

## 2022-11-27 MED ORDER — SODIUM CHLORIDE 0.9% FLUSH
10.0000 mL | INTRAVENOUS | Status: DC | PRN
  Administered 2022-11-27: 10 mL

## 2022-11-27 MED ORDER — PEGFILGRASTIM-CBQV 6 MG/0.6ML ~~LOC~~ SOSY
6.0000 mg | PREFILLED_SYRINGE | Freq: Once | SUBCUTANEOUS | Status: AC
  Administered 2022-11-27: 6 mg via SUBCUTANEOUS
  Filled 2022-11-27: qty 0.6

## 2022-11-27 NOTE — Patient Instructions (Signed)

## 2022-12-07 ENCOUNTER — Other Ambulatory Visit: Payer: Self-pay | Admitting: Oncology

## 2022-12-07 DIAGNOSIS — C186 Malignant neoplasm of descending colon: Secondary | ICD-10-CM

## 2022-12-08 ENCOUNTER — Inpatient Hospital Stay: Payer: BC Managed Care – PPO | Attending: Oncology

## 2022-12-08 ENCOUNTER — Ambulatory Visit (HOSPITAL_BASED_OUTPATIENT_CLINIC_OR_DEPARTMENT_OTHER)
Admission: RE | Admit: 2022-12-08 | Discharge: 2022-12-08 | Disposition: A | Discharge status: Home / Self Care | Payer: BC Managed Care – PPO | Source: Ambulatory Visit | Attending: Oncology | Admitting: Oncology

## 2022-12-08 DIAGNOSIS — Z5112 Encounter for antineoplastic immunotherapy: Secondary | ICD-10-CM | POA: Insufficient documentation

## 2022-12-08 DIAGNOSIS — C186 Malignant neoplasm of descending colon: Secondary | ICD-10-CM | POA: Diagnosis not present

## 2022-12-08 DIAGNOSIS — C7951 Secondary malignant neoplasm of bone: Secondary | ICD-10-CM | POA: Diagnosis not present

## 2022-12-08 DIAGNOSIS — C61 Malignant neoplasm of prostate: Secondary | ICD-10-CM | POA: Insufficient documentation

## 2022-12-08 DIAGNOSIS — Z5111 Encounter for antineoplastic chemotherapy: Secondary | ICD-10-CM | POA: Insufficient documentation

## 2022-12-08 DIAGNOSIS — D7389 Other diseases of spleen: Secondary | ICD-10-CM | POA: Diagnosis not present

## 2022-12-08 DIAGNOSIS — K769 Liver disease, unspecified: Secondary | ICD-10-CM | POA: Diagnosis not present

## 2022-12-08 DIAGNOSIS — Z5189 Encounter for other specified aftercare: Secondary | ICD-10-CM | POA: Insufficient documentation

## 2022-12-08 DIAGNOSIS — C801 Malignant (primary) neoplasm, unspecified: Secondary | ICD-10-CM | POA: Diagnosis not present

## 2022-12-08 MED ORDER — IOHEXOL 300 MG/ML  SOLN
100.0000 mL | Freq: Once | INTRAMUSCULAR | Status: AC | PRN
  Administered 2022-12-08: 100 mL via INTRAVENOUS

## 2022-12-08 MED ORDER — HEPARIN SODIUM (PORCINE) 1000 UNIT/ML IJ SOLN: 1000.0000 [IU] | Freq: Once | INTRAMUSCULAR | Status: DC

## 2022-12-08 NOTE — Patient Instructions (Signed)

## 2022-12-10 ENCOUNTER — Inpatient Hospital Stay: Payer: BC Managed Care – PPO | Admitting: Nurse Practitioner

## 2022-12-10 ENCOUNTER — Inpatient Hospital Stay: Payer: BC Managed Care – PPO

## 2022-12-10 ENCOUNTER — Encounter: Payer: Self-pay | Admitting: Nurse Practitioner

## 2022-12-10 VITALS — BP 138/97 | HR 77 | Resp 18

## 2022-12-10 VITALS — BP 120/93 | HR 87 | Temp 98.2°F | Resp 18 | Ht 72.0 in | Wt 212.3 lb

## 2022-12-10 DIAGNOSIS — Z5112 Encounter for antineoplastic immunotherapy: Secondary | ICD-10-CM | POA: Diagnosis not present

## 2022-12-10 DIAGNOSIS — C61 Malignant neoplasm of prostate: Secondary | ICD-10-CM | POA: Diagnosis not present

## 2022-12-10 DIAGNOSIS — C7951 Secondary malignant neoplasm of bone: Secondary | ICD-10-CM | POA: Diagnosis not present

## 2022-12-10 DIAGNOSIS — C186 Malignant neoplasm of descending colon: Secondary | ICD-10-CM

## 2022-12-10 DIAGNOSIS — Z5111 Encounter for antineoplastic chemotherapy: Secondary | ICD-10-CM | POA: Diagnosis not present

## 2022-12-10 DIAGNOSIS — Z5189 Encounter for other specified aftercare: Secondary | ICD-10-CM | POA: Diagnosis not present

## 2022-12-10 LAB — CBC WITH DIFFERENTIAL (CANCER CENTER ONLY)
Abs Immature Granulocytes: 1.46 10*3/uL — ABNORMAL HIGH (ref 0.00–0.07)
Basophils Absolute: 0.1 10*3/uL (ref 0.0–0.1)
Basophils Relative: 1 %
Eosinophils Absolute: 0.4 10*3/uL (ref 0.0–0.5)
Eosinophils Relative: 3 %
HCT: 40.5 % (ref 39.0–52.0)
Hemoglobin: 13.2 g/dL (ref 13.0–17.0)
Immature Granulocytes: 9 %
Lymphocytes Relative: 8 %
Lymphs Abs: 1.2 10*3/uL (ref 0.7–4.0)
MCH: 33.2 pg (ref 26.0–34.0)
MCHC: 32.6 g/dL (ref 30.0–36.0)
MCV: 101.8 fL — ABNORMAL HIGH (ref 80.0–100.0)
Monocytes Absolute: 1.2 10*3/uL — ABNORMAL HIGH (ref 0.1–1.0)
Monocytes Relative: 8 %
Neutro Abs: 11.4 10*3/uL — ABNORMAL HIGH (ref 1.7–7.7)
Neutrophils Relative %: 71 %
Platelet Count: 132 10*3/uL — ABNORMAL LOW (ref 150–400)
RBC: 3.98 MIL/uL — ABNORMAL LOW (ref 4.22–5.81)
RDW: 14.5 % (ref 11.5–15.5)
WBC Count: 15.8 10*3/uL — ABNORMAL HIGH (ref 4.0–10.5)
nRBC: 0.1 % (ref 0.0–0.2)

## 2022-12-10 LAB — CMP (CANCER CENTER ONLY)
ALT: 16 U/L (ref 0–44)
AST: 15 U/L (ref 15–41)
Albumin: 3.8 g/dL (ref 3.5–5.0)
Alkaline Phosphatase: 246 U/L — ABNORMAL HIGH (ref 38–126)
Anion gap: 9 (ref 5–15)
BUN: 19 mg/dL (ref 6–20)
CO2: 26 mmol/L (ref 22–32)
Calcium: 9 mg/dL (ref 8.9–10.3)
Chloride: 99 mmol/L (ref 98–111)
Creatinine: 1.11 mg/dL (ref 0.61–1.24)
GFR, Estimated: >60 mL/min (ref >60)
Glucose, Bld: 309 mg/dL — ABNORMAL HIGH (ref 70–99)
Potassium: 4.7 mmol/L (ref 3.5–5.1)
Sodium: 134 mmol/L — ABNORMAL LOW (ref 135–145)
Total Bilirubin: 0.7 mg/dL (ref 0.3–1.2)
Total Protein: 7.2 g/dL (ref 6.5–8.1)

## 2022-12-10 LAB — TOTAL PROTEIN, URINE DIPSTICK: Protein, ur: 100 mg/dL — AB

## 2022-12-10 LAB — CEA (ACCESS): CEA (CHCC): 7.48 ng/mL — ABNORMAL HIGH (ref 0.00–5.00)

## 2022-12-10 MED ORDER — SODIUM CHLORIDE 0.9 % IV SOLN
140.0000 mg/m2 | Freq: Once | INTRAVENOUS | Status: AC
  Administered 2022-12-10: 300 mg via INTRAVENOUS
  Filled 2022-12-10: qty 15

## 2022-12-10 MED ORDER — FLUOROURACIL CHEMO INJECTION 2.5 GM/50ML
400.0000 mg/m2 | Freq: Once | INTRAVENOUS | Status: AC
  Administered 2022-12-10: 900 mg via INTRAVENOUS
  Filled 2022-12-10: qty 18

## 2022-12-10 MED ORDER — ATROPINE SULFATE 1 MG/ML IV SOLN
0.5000 mg | Freq: Once | INTRAVENOUS | Status: AC | PRN
  Administered 2022-12-10: 0.5 mg via INTRAVENOUS
  Filled 2022-12-10: qty 1

## 2022-12-10 MED ORDER — SODIUM CHLORIDE 0.9 % IV SOLN: Freq: Once | INTRAVENOUS | Status: AC

## 2022-12-10 MED ORDER — SODIUM CHLORIDE 0.9 % IV SOLN
10.0000 mg | Freq: Once | INTRAVENOUS | Status: AC
  Administered 2022-12-10: 10 mg via INTRAVENOUS
  Filled 2022-12-10: qty 10

## 2022-12-10 MED ORDER — SODIUM CHLORIDE 0.9 % IV SOLN
400.0000 mg/m2 | Freq: Once | INTRAVENOUS | Status: AC
  Administered 2022-12-10: 892 mg via INTRAVENOUS
  Filled 2022-12-10: qty 44.6

## 2022-12-10 MED ORDER — SODIUM CHLORIDE 0.9 % IV SOLN
5.0000 mg/kg | Freq: Once | INTRAVENOUS | Status: AC
  Administered 2022-12-10: 500 mg via INTRAVENOUS
  Filled 2022-12-10: qty 16

## 2022-12-10 MED ORDER — PALONOSETRON HCL INJECTION 0.25 MG/5ML
0.2500 mg | Freq: Once | INTRAVENOUS | Status: AC
  Administered 2022-12-10: 0.25 mg via INTRAVENOUS
  Filled 2022-12-10: qty 5

## 2022-12-10 MED ORDER — SODIUM CHLORIDE 0.9 % IV SOLN
2400.0000 mg/m2 | INTRAVENOUS | Status: DC
  Administered 2022-12-10: 5000 mg via INTRAVENOUS
  Filled 2022-12-10: qty 100

## 2022-12-10 NOTE — Progress Notes (Deleted)
exam

## 2022-12-10 NOTE — Progress Notes (Signed)
[facility]   Diagnosis: Colon cancer  INTERVAL HISTORY:   [name] returns as scheduled.  He completed another cycle of FOLFIRI/Avastin 11/25/2022.  He again had arthralgias for several days.  A couple of days he had nausea.  No mouth sores.  No diarrhea.  No bleeding.  He reports a cyst at the mid upper back ruptured and drained.  Objective:  Vital signs in last 24 hours:  Blood pressure (!) 120/93, pulse 87, temperature 98.2 F (36.8 C), resp. rate 18, height 6' (1.829 m), weight 212 lb 4.8 oz (96.3 kg), SpO2 100%.    HEENT: No thrush or ulcers. Resp: Lungs clear bilaterally. Cardio: Regular rate and rhythm. GI: No hepatosplenomegaly.  Suprapubic cystic area with bloody drainage. Vascular: No leg edema.  Skin: Palms without erythema.  Healing cystic-appearing lesion mid upper back. Portacath without erythema.  Lab Results:  Lab Results  Component Value Date   WBC 15.8 (H) 12/10/2022   HGB 13.2 12/10/2022   HCT 40.5 12/10/2022   MCV 101.8 (H) 12/10/2022   PLT 132 (L) 12/10/2022   NEUTROABS 11.4 (H) 12/10/2022    Imaging:  CT CHEST ABDOMEN PELVIS W CONTRAST  Result Date: 12/10/2022 CLINICAL DATA:  Colon cancer staging; * Tracking Code: BO * EXAM: CT CHEST, ABDOMEN, AND PELVIS WITH CONTRAST TECHNIQUE: Multidetector CT imaging of the chest, abdomen and pelvis was performed following the standard protocol during bolus administration of intravenous contrast. RADIATION DOSE REDUCTION: This exam was performed according to the departmental dose-optimization program which includes automated exposure control, adjustment of the mA and/or kV according to patient size and/or use of iterative reconstruction technique. CONTRAST:  OMNIPAQUE IOHEXOL 300 MG/ML  SOLN COMPARISON:  CT chest, abdomen and pelvis dated August 30, 2022 FINDINGS: CT CHEST FINDINGS Cardiovascular: Normal heart size. Prior mitral valve replacement. No pericardial effusion.  Normal caliber thoracic aorta with mild atherosclerotic disease. Mild coronary artery calcifications. Right chest wall port with tip in the right atrium. Mediastinum/Nodes: Small hiatal hernia. Thyroid is unremarkable. Stable cystic of the right posterior chest wall measuring 11 mm in short axis on series 2, image 40, non FDG avid on prior PET and likely a lymphocele. No enlarged axillary, mediastinal or hilar lymph nodes. Lungs/Pleura: Focal bronchiectasis and bronchial wall thickening of the right upper lobe, unchanged when compared with the prior exam. Scattered solid pulmonary nodules are stable. Solid nodule of the right lower lobe measuring 5 mm on series 4, image 99. Musculoskeletal: Numerous lytic and sclerotic osseous lesions seen throughout the osseous structures of the chest., Unchanged when compared with the prior exam. Ax CT ABDOMEN PELVIS FINDINGS Hepatobiliary: Stable ill-defined lesion of the left hepatic lobe measuring 2.2 x 2.0 cm. Gallstones. No gallbladder wall thickening. Pancreas: Unremarkable. No pancreatic ductal dilatation or surrounding inflammatory changes. Spleen: Stable large heterogeneous splenic lesion measuring 11.9 x 9.3 cm. Additional smaller splenic lesions are seen which are also stable. Adrenals/Urinary Tract: Bilateral adrenal glands are unremarkable. Prior left nephrectomy. No hydronephrosis or suspicious right renal lesions. Bladder wall thickening, slightly increased when compared with the prior exam. Stomach/Bowel: Prior sigmoidectomy. Normal appendix. No wall thickening evidence of obstruction, or inflammatory change. Normal appearance of the stomach. Vascular/Lymphatic: Aortic atherosclerosis. No enlarged abdominal or pelvic lymph nodes. Reproductive: Stable prostatomegaly. Other: Small fat containing right inguinal hernia, unchanged. Ill-defined subcutaneous soft tissue nodules of the right anterior abdomen, likely injection related. No abdominopelvic ascites.  Musculoskeletal: Unchanged lytic and sclerotic lesions seen throughout the osseous  structures of the abdomen and pelvis. IMPRESSION: 1. Stable lesion of the posterior left lobe of the liver. 2. Unchanged diffuse lytic and sclerotic osseous metastatic disease. 3. Stable small solid pulmonary nodules. 4. Stable splenic lesions, demonstrated no FDG activity on prior PET and likely benign lesion such as hamartoma. 5. Slightly increased bladder wall thickening, which may be due to chronic bladder outlet obstruction given associated prostatomegaly. Cystitis could also have this appearance. Correlate with urinalysis. 6. Aortic Atherosclerosis (ICD10-I70.0). Electronically Signed   By: Allegra Lai M.D.   On: 12/10/2022 11:42    Medications: I have reviewed the patient's current medications.  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, status post a left colectomy 03/04/2019 Mass felt to be in the transverse colon on a colonoscopy 01/07/2019 with a biopsy confirming poorly differentiated adenocarcinoma with signet ring cell features, mismatch repair protein expression normal CT abdomen/pelvis 01/08/2019 12.3 cm indeterminate splenic mass, present in 2015 on a lumbar MRI-felt to be benign, small subpleural and pleural-based nodules at the lung bases, status post left nephrectomy CT chest 01/20/2019-small bilateral pulmonary nodules with rounded parenchymal nodules in the right lower lobe, indeterminate splenic mass CT chest 03/18/2019-multiple subcentimeter pulmonary nodules again seen bilaterally, greatest in the lower lobes, without significant change.  No new or enlarging pulmonary nodules or masses.  Partially visualized large heterogeneous splenic mass with no significant change.  Plan for follow-up chest CT at a 8-month interval. Cycle 1 FOLFOX 03/21/2019 Cycle 2 FOLFOX 04/04/2019 Cycle 3 FOLFOX  04/18/2019 (oxaliplatin dose reduced secondary to thrombocytopenia) Cycle 4 FOLFOX 05/02/2019 (oxaliplatin held secondary to thrombocytopenia) Cycle 5 FOLFOX 05/16/2019 Cycle 6 FOLFOX 05/30/2019 (oxaliplatin held secondary to thrombocytopenia) CT chest 06/08/2019-stable scattered small solid pulmonary nodules  Cycle 7 FOLFOX 06/13/2019 Cycle 8 FOLFOX 06/27/2019 Cycle 9 FOLFOX 07/11/2019 Cycle 10 FOLFOX 07/25/2019 (oxaliplatin held due to thrombocytopenia) Cycle 11 FOLFOX 08/08/2019 Cycle 12 FOLFOX 08/22/2019 CTs 01/09/2020-no evidence of recurrent disease, stable lung nodules favored to represent subpleural lymph nodes-dedicated follow-up not recommended CTs 01/11/2021-no evidence of recurrent disease CTs 01/24/2022-no evidence of recurrent disease CT lumbar spine, abdomen/pelvis 04/11/2022-numerous mixed lytic and sclerotic lesions scattered throughout the visualized axial and appendicular skeleton including several areas in the lumbar spine.   04/21/2022 CT biopsy sclerotic lesion right anterior iliac bone-consistent with metastatic colorectal adenocarcinoma, moderate to poorly differentiated.  Foundation 1-microsatellite stable, tumor mutation burden 4, K-ras G12S PET scan 05/09/2022-widespread hypermetabolic mixed faintly lytic and sclerotic osseous metastases throughout the axial and proximal appendicular skeleton; associated mild pathologic compression fracture at L3; no hypermetabolic extraosseous metastatic disease. Cycle 1 FOLFIRI/bevacizumab 05/26/2022 Cycle 2 FOLFIRI/bevacizumab 06/09/2022 Cycle 3 FOLFIRI 06/24/2022, bevacizumab 06/26/2022 Cycle 4 FOLFIRI 07/08/2022, bevacizumab held due to proteinuria Cycle 5 FOLFIRI 07/22/2022, bevacizumab held due to proteinuria CTs 07/31/2022-potentially areas of sclerosis represent interval healing; new lytic lesion at T12, multiple new areas of punched-out sclerosis throughout the visualized osseous structures.  Index lesion in the left iliac wing measures 1.3 x 2.8 cm.   Healing of previously metabolically occult metastatic disease on 05/09/2022 is a possibility. Cycle 6 FOLFIRI 08/04/2022, irinotecan dose reduced due to thrombocytopenia, bevacizumab held pending 24-hour urine result Cycle 7 FOLFIRI 08/18/2022, bevacizumab held secondary to proteinuria, G-CSF held Cycle 8 FOLFIRI 09/01/2022, bevacizumab held, G-CSF Cycle 9 FOLFIRI 09/16/2022, bevacizumab held, G-CSF CTs 09/29/2022-enlargement of a hypodense lesion posterior left lobe of the liver, similar diffuse mixed lytic and sclerotic osseous metastatic disease, multiple new small nodules medial  right lung base, multiple other small bilateral pulmonary nodules unchanged. Cycle 10 FOLFIRI plus Avastin 10/08/2022 Cycle 11 FOLFIRI 10/27/2022, Avastin held Cycle 12 FOLFIRI/Avastin 11/11/2022 Cycle 13 FOLFIRI/Avastin 11/25/2022 CTs 12/08/2022-stable lesion posterior left lobe of the liver.  Unchanged diffuse lytic and sclerotic osseous metastatic disease.  Stable small solid pulmonary nodules.  Stable splenic lesions.  Slightly increased bladder wall thickening. 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  Left nephrectomy at age 77 12.  Port-A-Cath placement, Dr. Michaell Cowing, 03/16/2019 13.  Thrombocytopenia secondary to chemotherapy-oxaliplatin dose reduced beginning with cycle 3 FOLFOX, improved 14.  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  Right groin soft tissue infection May 2023-status postsurgical debridement 18.  Left leg DVT-common femoral, femoral, popliteal, posterior tibial, and peroneal veins 19.  02/04/2022-MRI cervical spine-1.7 cm T1 lesion-indeterminate; bone scan 03/14/2022-focal intense activity at approximate T1 level felt to correspond to the lesion on MRI, additional foci of activity involving the lower thoracic and lumbar spine, right iliac bone and pubic symphysis.  CT lumbar spine, abdomen/pelvis 04/11/2022-numerous mixed lytic and sclerotic lesions scattered throughout the visualized axial and appendicular skeleton including several areas in the lumbar spine.  04/21/2022 CT biopsy sclerotic lesion right anterior iliac bone-consistent with metastatic colorectal adenocarcinoma, moderate to poorly differentiated 20.  Inflamed sebaceous cyst 08/18/2022-doxycycline  Disposition: [name] appears stable.  He has completed 13 cycles of FOLFIRI/Avastin.  Recent restaging CTs show stable disease.  Results/images reviewed with him at today's visit.  He agrees with the recommendation to continue current treatment every 2 weeks.  Plan to proceed with cycle 14 today as scheduled.  CBC and chemistry panel reviewed.  Labs adequate to proceed as above.  Urine protein stable at 100.  He will return for follow-up and treatment in 2 weeks.  He will contact the office in the interim with any problems.  Patient seen with [name].    [name]   12/10/2022  11:50 AM  This was a shared visit with [name].  We reviewed the CT findings and images with [name].  There is no clinical or radiologic evidence of disease progression.  The CEA is slightly higher.  I recommend continuing FOLFIRI/Avastin.  He appears to be tolerating the treatment well.  [name] agrees to continue treatment on the current schedule.  We will plan for  a restaging evaluation after 4-5 more cycles of FOLFIRI/Avastin.  I was present for greater than 50% of today's visit.  I performed medical decision making.  Mancel Bale, MD

## 2022-12-10 NOTE — Patient Instructions (Signed)
[facility]   Discharge Instructions: Thank you for choosing [facility] to provide your oncology and hematology care.   If you have a lab appointment with the Cancer Center, please go directly to the Cancer Center and check in at the registration area.   Wear comfortable clothing and clothing appropriate for easy access to any Portacath or PICC line.   We strive to give you quality time with your provider. You may need to reschedule your appointment if you arrive late (15 or more minutes).  Arriving late affects you and other patients whose appointments are after yours.  Also, if you miss three or more appointments without notifying the office, you may be dismissed from the clinic at the provider's discretion.      For prescription refill requests, have your pharmacy contact our office and allow 72 hours for refills to be completed.    Today you received the following chemotherapy and/or immunotherapy agents Bevacizumab-awwb (MVASI), Irinotecan (CAMPTOSAR), Leucovorin & Flourouracil (ADRUCIL).      To help prevent nausea and vomiting after your treatment, we encourage you to take your nausea medication as directed.  BELOW ARE SYMPTOMS THAT SHOULD BE REPORTED IMMEDIATELY: *FEVER GREATER THAN 100.4 F (38 C) OR HIGHER *CHILLS OR SWEATING *NAUSEA AND VOMITING THAT IS NOT CONTROLLED WITH YOUR NAUSEA MEDICATION *UNUSUAL SHORTNESS OF BREATH *UNUSUAL BRUISING OR BLEEDING *URINARY PROBLEMS (pain or burning when urinating, or frequent urination) *BOWEL PROBLEMS (unusual diarrhea, constipation, pain near the anus) TENDERNESS IN MOUTH AND THROAT WITH OR WITHOUT PRESENCE OF ULCERS (sore throat, sores in mouth, or a toothache) UNUSUAL RASH, SWELLING OR PAIN  UNUSUAL VAGINAL DISCHARGE OR ITCHING   Items with * indicate a potential emergency and should be followed up as soon as possible or go to the Emergency Department if any problems should  occur.  Please show the CHEMOTHERAPY ALERT CARD or IMMUNOTHERAPY ALERT CARD at check-in to the Emergency Department and triage nurse.  Should you have questions after your visit or need to cancel or reschedule your appointment, please contact [facility]  Dept: [phone]  and follow the prompts.  Office hours are 8:00 a.m. to 4:30 p.m. Monday - Friday. Please note that voicemails left after 4:00 p.m. may not be returned until the following business day.  We are closed weekends and major holidays. You have access to a nurse at all times for urgent questions. Please call the main number to the clinic Dept: [phone] and follow the prompts.   For any non-urgent questions, you may also contact your provider using MyChart. We now offer e-Visits for anyone 31 and older to request care online for non-urgent symptoms. For details visit [email or web address].   Also download the MyChart app! Go to the app store, search "MyChart", open the app, select , and log in with your MyChart username and password.  Bevacizumab Injection What is this medication? BEVACIZUMAB (be va SIZ yoo mab) treats some types of cancer. It works by blocking a protein that causes cancer cells to grow and multiply. This helps to slow or stop the spread of cancer cells. It is a monoclonal antibody. This medicine may be used for other purposes; ask your health care provider or pharmacist if you have questions. COMMON BRAND NAME(S): Alymsys, Avastin, MVASI, Omer Jack What should I tell my care team before I take this medication? They need to know if you have any of these conditions: Blood clots Coughing up  blood Having or recent surgery Heart failure High blood pressure History of a connection between 2 or more body parts that do not usually connect (fistula) History of a tear in your stomach or intestines Protein in your urine An unusual or allergic reaction to bevacizumab,  other medications, foods, dyes, or preservatives Pregnant or trying to get pregnant Breast-feeding How should I use this medication? This medication is injected into a vein. It is given by your care team in a hospital or clinic setting. Talk to your care team the use of this medication in children. Special care may be needed. Overdosage: If you think you have taken too much of this medicine contact a poison control center or emergency room at once. NOTE: This medicine is only for you. Do not share this medicine with others. What if I miss a dose? Keep appointments for follow-up doses. It is important not to miss your dose. Call your care team if you are unable to keep an appointment. What may interact with this medication? Interactions are not expected. This list may not describe all possible interactions. Give your health care provider a list of all the medicines, herbs, non-prescription drugs, or dietary supplements you use. Also tell them if you smoke, drink alcohol, or use illegal drugs. Some items may interact with your medicine. What should I watch for while using this medication? Your condition will be monitored carefully while you are receiving this medication. You may need blood work while taking this medication. This medication may make you feel generally unwell. This is not uncommon as chemotherapy can affect healthy cells as well as cancer cells. Report any side effects. Continue your course of treatment even though you feel ill unless your care team tells you to stop. This medication may increase your risk to bruise or bleed. Call your care team if you notice any unusual bleeding. Before having surgery, talk to your care team to make sure it is ok. This medication can increase the risk of poor healing of your surgical site or wound. You will need to stop this medication for 28 days before surgery. After surgery, wait at least 28 days before restarting this medication. Make sure the  surgical site or wound is healed enough before restarting this medication. Talk to your care team if questions. Talk to your care team if you may be pregnant. Serious birth defects can occur if you take this medication during pregnancy and for 6 months after the last dose. Contraception is recommended while taking this medication and for 6 months after the last dose. Your care team can help you find the option that works for you. Do not breastfeed while taking this medication and for 6 months after the last dose. This medication can cause infertility. Talk to your care team if you are concerned about your fertility. What side effects may I notice from receiving this medication? Side effects that you should report to your care team as soon as possible: Allergic reactions--skin rash, itching, hives, swelling of the face, lips, tongue, or throat Bleeding--bloody or black, tar-like stools, vomiting blood or brown material that looks like coffee grounds, red or dark brown urine, small red or purple spots on skin, unusual bruising or bleeding Blood clot--pain, swelling, or warmth in the leg, shortness of breath, chest pain Heart attack--pain or tightness in the chest, shoulders, arms, or jaw, nausea, shortness of breath, cold or clammy skin, feeling faint or lightheaded Heart failure--shortness of breath, swelling of the ankles, feet, or hands,  sudden weight gain, unusual weakness or fatigue Increase in blood pressure Infection--fever, chills, cough, sore throat, wounds that don't heal, pain or trouble when passing urine, general feeling of discomfort or being unwell Infusion reactions--chest pain, shortness of breath or trouble breathing, feeling faint or lightheaded Kidney injury--decrease in the amount of urine, swelling of the ankles, hands, or feet Stomach pain that is severe, does not go away, or gets worse Stroke--sudden numbness or weakness of the face, arm, or leg, trouble speaking, confusion,  trouble walking, loss of balance or coordination, dizziness, severe headache, change in vision Sudden and severe headache, confusion, change in vision, seizures, which may be signs of posterior reversible encephalopathy syndrome (PRES) Side effects that usually do not require medical attention (report to your care team if they continue or are bothersome): Back pain Change in taste Diarrhea Dry skin Increased tears Nosebleed This list may not describe all possible side effects. Call your doctor for medical advice about side effects. You may report side effects to FDA at 1-800-FDA-1088. Where should I keep my medication? This medication is given in a hospital or clinic. It will not be stored at home. NOTE: This sheet is a summary. It may not cover all possible information. If you have questions about this medicine, talk to your doctor, pharmacist, or health care provider.  2024 Elsevier/Gold Standard (2021-08-02 00:00:00)   Irinotecan Injection What is this medication? IRINOTECAN (ir in oh TEE kan) treats some types of cancer. It works by slowing down the growth of cancer cells. This medicine may be used for other purposes; ask your health care provider or pharmacist if you have questions. COMMON BRAND NAME(S): Camptosar What should I tell my care team before I take this medication? They need to know if you have any of these conditions: Dehydration Diarrhea Infection, especially a viral infection, such as chickenpox, cold sores, herpes Liver disease Low blood cell levels (white cells, red cells, and platelets) Low levels of electrolytes, such as calcium, magnesium, or potassium in your blood Recent or ongoing radiation An unusual or allergic reaction to irinotecan, other medications, foods, dyes, or preservatives If you or your partner are pregnant or trying to get pregnant Breast-feeding How should I use this medication? This medication is injected into a vein. It is given by your  care team in a hospital or clinic setting. Talk to your care team about the use of this medication in children. Special care may be needed. Overdosage: If you think you have taken too much of this medicine contact a poison control center or emergency room at once. NOTE: This medicine is only for you. Do not share this medicine with others. What if I miss a dose? Keep appointments for follow-up doses. It is important not to miss your dose. Call your care team if you are unable to keep an appointment. What may interact with this medication? Do not take this medication with any of the following: Cobicistat Itraconazole This medication may also interact with the following: Certain antibiotics, such as clarithromycin, rifampin, rifabutin Certain antivirals for HIV or AIDS Certain medications for fungal infections, such as ketoconazole, posaconazole, voriconazole Certain medications for seizures, such as carbamazepine, phenobarbital, phenytoin Gemfibrozil Nefazodone St. John's wort This list may not describe all possible interactions. Give your health care provider a list of all the medicines, herbs, non-prescription drugs, or dietary supplements you use. Also tell them if you smoke, drink alcohol, or use illegal drugs. Some items may interact with your medicine. What should I  watch for while using this medication? Your condition will be monitored carefully while you are receiving this medication. You may need blood work while taking this medication. This medication may make you feel generally unwell. This is not uncommon as chemotherapy can affect healthy cells as well as cancer cells. Report any side effects. Continue your course of treatment even though you feel ill unless your care team tells you to stop. This medication can cause serious side effects. To reduce the risk, your care team may give you other medications to take before receiving this one. Be sure to follow the directions from your  care team. This medication may affect your coordination, reaction time, or judgement. Do not drive or operate machinery until you know how this medication affects you. Sit up or stand slowly to reduce the risk of dizzy or fainting spells. Drinking alcohol with this medication can increase the risk of these side effects. This medication may increase your risk of getting an infection. Call your care team for advice if you get a fever, chills, sore throat, or other symptoms of a cold or flu. Do not treat yourself. Try to avoid being around people who are sick. Avoid taking medications that contain aspirin, acetaminophen, ibuprofen, naproxen, or ketoprofen unless instructed by your care team. These medications may hide a fever. This medication may increase your risk to bruise or bleed. Call your care team if you notice any unusual bleeding. Be careful brushing or flossing your teeth or using a toothpick because you may get an infection or bleed more easily. If you have any dental work done, tell your dentist you are receiving this medication. Talk to your care team if you or your partner are pregnant or think either of you might be pregnant. This medication can cause serious birth defects if taken during pregnancy and for 6 months after the last dose. You will need a negative pregnancy test before starting this medication. Contraception is recommended while taking this medication and for 6 months after the last dose. Your care team can help you find the option that works for you. Do not father a child while taking this medication and for 3 months after the last dose. Use a condom for contraception during this time period. Do not breastfeed while taking this medication and for 7 days after the last dose. This medication may cause infertility. Talk to your care team if you are concerned about your fertility. What side effects may I notice from receiving this medication? Side effects that you should report to  your care team as soon as possible: Allergic reactions--skin rash, itching, hives, swelling of the face, lips, tongue, or throat Dry cough, shortness of breath or trouble breathing Increased saliva or tears, increased sweating, stomach cramping, diarrhea, small pupils, unusual weakness or fatigue, slow heartbeat Infection--fever, chills, cough, sore throat, wounds that don't heal, pain or trouble when passing urine, general feeling of discomfort or being unwell Kidney injury--decrease in the amount of urine, swelling of the ankles, hands, or feet Low red blood cell level--unusual weakness or fatigue, dizziness, headache, trouble breathing Severe or prolonged diarrhea Unusual bruising or bleeding Side effects that usually do not require medical attention (report to your care team if they continue or are bothersome): Constipation Diarrhea Hair loss Loss of appetite Nausea Stomach pain This list may not describe all possible side effects. Call your doctor for medical advice about side effects. You may report side effects to FDA at 1-800-FDA-1088. Where should I keep my  medication? This medication is given in a hospital or clinic. It will not be stored at home. NOTE: This sheet is a summary. It may not cover all possible information. If you have questions about this medicine, talk to your doctor, pharmacist, or health care provider.  2024 Elsevier/Gold Standard (2021-07-29 00:00:00)   Leucovorin Injection What is this medication? LEUCOVORIN (loo koe VOR in) prevents side effects from certain medications, such as methotrexate. It works by increasing folate levels. This helps protect healthy cells in your body. It may also be used to treat anemia caused by low levels of folate. It can also be used with fluorouracil, a type of chemotherapy, to treat colorectal cancer. It works by increasing the effects of fluorouracil in the body. This medicine may be used for other purposes; ask your health care  provider or pharmacist if you have questions. What should I tell my care team before I take this medication? They need to know if you have any of these conditions: Anemia from low levels of vitamin B12 in the blood An unusual or allergic reaction to leucovorin, folic acid, other medications, foods, dyes, or preservatives Pregnant or trying to get pregnant Breastfeeding How should I use this medication? This medication is injected into a vein or a muscle. It is given by your care team in a hospital or clinic setting. Talk to your care team about the use of this medication in children. Special care may be needed. Overdosage: If you think you have taken too much of this medicine contact a poison control center or emergency room at once. NOTE: This medicine is only for you. Do not share this medicine with others. What if I miss a dose? Keep appointments for follow-up doses. It is important not to miss your dose. Call your care team if you are unable to keep an appointment. What may interact with this medication? Capecitabine Fluorouracil Phenobarbital Phenytoin Primidone Trimethoprim;sulfamethoxazole This list may not describe all possible interactions. Give your health care provider a list of all the medicines, herbs, non-prescription drugs, or dietary supplements you use. Also tell them if you smoke, drink alcohol, or use illegal drugs. Some items may interact with your medicine. What should I watch for while using this medication? Your condition will be monitored carefully while you are receiving this medication. This medication may increase the side effects of 5-fluorouracil. Tell your care team if you have diarrhea or mouth sores that do not get better or that get worse. What side effects may I notice from receiving this medication? Side effects that you should report to your care team as soon as possible: Allergic reactions--skin rash, itching, hives, swelling of the face, lips, tongue,  or throat This list may not describe all possible side effects. Call your doctor for medical advice about side effects. You may report side effects to FDA at 1-800-FDA-1088. Where should I keep my medication? This medication is given in a hospital or clinic. It will not be stored at home. NOTE: This sheet is a summary. It may not cover all possible information. If you have questions about this medicine, talk to your doctor, pharmacist, or health care provider.  2024 Elsevier/Gold Standard (2021-08-20 00:00:00)   Fluorouracil Injection What is this medication? FLUOROURACIL (flure oh YOOR a sil) treats some types of cancer. It works by slowing down the growth of cancer cells. This medicine may be used for other purposes; ask your health care provider or pharmacist if you have questions. COMMON BRAND NAME(S): Adrucil What  should I tell my care team before I take this medication? They need to know if you have any of these conditions: Blood disorders Dihydropyrimidine dehydrogenase (DPD) deficiency Infection, such as chickenpox, cold sores, herpes Kidney disease Liver disease Poor nutrition Recent or ongoing radiation therapy An unusual or allergic reaction to fluorouracil, other medications, foods, dyes, or preservatives If you or your partner are pregnant or trying to get pregnant Breast-feeding How should I use this medication? This medication is injected into a vein. It is administered by your care team in a hospital or clinic setting. Talk to your care team about the use of this medication in children. Special care may be needed. Overdosage: If you think you have taken too much of this medicine contact a poison control center or emergency room at once. NOTE: This medicine is only for you. Do not share this medicine with others. What if I miss a dose? Keep appointments for follow-up doses. It is important not to miss your dose. Call your care team if you are unable to keep an  appointment. What may interact with this medication? Do not take this medication with any of the following: Live virus vaccines This medication may also interact with the following: Medications that treat or prevent blood clots, such as warfarin, enoxaparin, dalteparin This list may not describe all possible interactions. Give your health care provider a list of all the medicines, herbs, non-prescription drugs, or dietary supplements you use. Also tell them if you smoke, drink alcohol, or use illegal drugs. Some items may interact with your medicine. What should I watch for while using this medication? Your condition will be monitored carefully while you are receiving this medication. This medication may make you feel generally unwell. This is not uncommon as chemotherapy can affect healthy cells as well as cancer cells. Report any side effects. Continue your course of treatment even though you feel ill unless your care team tells you to stop. In some cases, you may be given additional medications to help with side effects. Follow all directions for their use. This medication may increase your risk of getting an infection. Call your care team for advice if you get a fever, chills, sore throat, or other symptoms of a cold or flu. Do not treat yourself. Try to avoid being around people who are sick. This medication may increase your risk to bruise or bleed. Call your care team if you notice any unusual bleeding. Be careful brushing or flossing your teeth or using a toothpick because you may get an infection or bleed more easily. If you have any dental work done, tell your dentist you are receiving this medication. Avoid taking medications that contain aspirin, acetaminophen, ibuprofen, naproxen, or ketoprofen unless instructed by your care team. These medications may hide a fever. Do not treat diarrhea with over the counter products. Contact your care team if you have diarrhea that lasts more than 2  days or if it is severe and watery. This medication can make you more sensitive to the sun. Keep out of the sun. If you cannot avoid being in the sun, wear protective clothing and sunscreen. Do not use sun lamps, tanning beds, or tanning booths. Talk to your care team if you or your partner wish to become pregnant or think you might be pregnant. This medication can cause serious birth defects if taken during pregnancy and for 3 months after the last dose. A reliable form of contraception is recommended while taking this medication and for  3 months after the last dose. Talk to your care team about effective forms of contraception. Do not father a child while taking this medication and for 3 months after the last dose. Use a condom while having sex during this time period. Do not breastfeed while taking this medication. This medication may cause infertility. Talk to your care team if you are concerned about your fertility. What side effects may I notice from receiving this medication? Side effects that you should report to your care team as soon as possible: Allergic reactions--skin rash, itching, hives, swelling of the face, lips, tongue, or throat Heart attack--pain or tightness in the chest, shoulders, arms, or jaw, nausea, shortness of breath, cold or clammy skin, feeling faint or lightheaded Heart failure--shortness of breath, swelling of the ankles, feet, or hands, sudden weight gain, unusual weakness or fatigue Heart rhythm changes--fast or irregular heartbeat, dizziness, feeling faint or lightheaded, chest pain, trouble breathing High ammonia level--unusual weakness or fatigue, confusion, loss of appetite, nausea, vomiting, seizures Infection--fever, chills, cough, sore throat, wounds that don't heal, pain or trouble when passing urine, general feeling of discomfort or being unwell Low red blood cell level--unusual weakness or fatigue, dizziness, headache, trouble breathing Pain, tingling, or  numbness in the hands or feet, muscle weakness, change in vision, confusion or trouble speaking, loss of balance or coordination, trouble walking, seizures Redness, swelling, and blistering of the skin over hands and feet Severe or prolonged diarrhea Unusual bruising or bleeding Side effects that usually do not require medical attention (report to your care team if they continue or are bothersome): Dry skin Headache Increased tears Nausea Pain, redness, or swelling with sores inside the mouth or throat Sensitivity to light Vomiting This list may not describe all possible side effects. Call your doctor for medical advice about side effects. You may report side effects to FDA at 1-800-FDA-1088. Where should I keep my medication? This medication is given in a hospital or clinic. It will not be stored at home. NOTE: This sheet is a summary. It may not cover all possible information. If you have questions about this medicine, talk to your doctor, pharmacist, or health care provider.  2024 Elsevier/Gold Standard (2021-07-23 00:00:00)   The chemotherapy medication bag should finish at 46 hours, 96 hours, or 7 days. For example, if your pump is scheduled for 46 hours and it was put on at 4:00 p.m., it should finish at 2:00 p.m. the day it is scheduled to come off regardless of your appointment time.     Estimated time to finish at 1:30 p.m. on Friday 12/12/2022.   If the display on your pump reads "Low Volume" and it is beeping, take the batteries out of the pump and come to the cancer center for it to be taken off.   If the pump alarms go off prior to the pump reading "Low Volume" then call [phone] and someone can assist you.  If the plunger comes out and the chemotherapy medication is leaking out, please use your home chemo spill kit to clean up the spill. Do NOT use paper towels or other household products.  If you have problems or questions regarding your pump, please call either  [phone] (24 hours a day) or the cancer center Monday-Friday 8:00 a.m.- 4:30 p.m. at the clinic number and we will assist you. If you are unable to get assistance, then go to the nearest Emergency Department and ask the staff to contact the IV team for assistance.

## 2022-12-10 NOTE — Progress Notes (Signed)
Patient seen by Lonna Cobb NP today  Vitals are not all within treatment parameters. B/P 120/93 no intervention   Labs reviewed by Lonna Cobb NP and are within treatment parameters.  Per physician team, patient is ready for treatment and there are NO modifications to the treatment plan.

## 2022-12-12 ENCOUNTER — Inpatient Hospital Stay: Payer: BC Managed Care – PPO

## 2022-12-12 ENCOUNTER — Other Ambulatory Visit: Payer: Self-pay | Admitting: *Deleted

## 2022-12-12 VITALS — BP 141/82 | HR 78 | Temp 98.1°F | Resp 18

## 2022-12-12 DIAGNOSIS — Z5112 Encounter for antineoplastic immunotherapy: Secondary | ICD-10-CM | POA: Diagnosis not present

## 2022-12-12 DIAGNOSIS — C61 Malignant neoplasm of prostate: Secondary | ICD-10-CM | POA: Diagnosis not present

## 2022-12-12 DIAGNOSIS — Z5111 Encounter for antineoplastic chemotherapy: Secondary | ICD-10-CM | POA: Diagnosis not present

## 2022-12-12 DIAGNOSIS — C186 Malignant neoplasm of descending colon: Secondary | ICD-10-CM | POA: Diagnosis not present

## 2022-12-12 DIAGNOSIS — Z5189 Encounter for other specified aftercare: Secondary | ICD-10-CM | POA: Diagnosis not present

## 2022-12-12 DIAGNOSIS — C7951 Secondary malignant neoplasm of bone: Secondary | ICD-10-CM | POA: Diagnosis not present

## 2022-12-12 MED ORDER — PEGFILGRASTIM-CBQV 6 MG/0.6ML ~~LOC~~ SOSY
6.0000 mg | PREFILLED_SYRINGE | Freq: Once | SUBCUTANEOUS | Status: AC
  Administered 2022-12-12: 6 mg via SUBCUTANEOUS
  Filled 2022-12-12: qty 0.6

## 2022-12-12 MED ORDER — HEPARIN SOD (PORK) LOCK FLUSH 100 UNIT/ML IV SOLN
500.0000 [IU] | Freq: Once | INTRAVENOUS | Status: AC | PRN
  Administered 2022-12-12: 500 [IU]

## 2022-12-12 MED ORDER — SODIUM CHLORIDE 0.9% FLUSH
10.0000 mL | INTRAVENOUS | Status: DC | PRN
  Administered 2022-12-12: 10 mL

## 2022-12-12 NOTE — Patient Instructions (Signed)
Implanted Port Home Guide An implanted port is a device that is placed under the skin. It is usually placed in the chest. The device may vary based on the need. Implanted ports can be used to give IV medicine, to take blood, or to give fluids. You may have an implanted port if: You need IV medicine that would be irritating to the small veins in your hands or arms. You need IV medicines, such as chemotherapy, for a long period of time. You need IV nutrition for a long period of time. You may have fewer limitations when using a port than you would if you used other types of long-term IVs. You will also likely be able to return to normal activities after your incision heals. An implanted port has two main parts: Reservoir. The reservoir is the part where a needle is inserted to give medicines or draw blood. The reservoir is round. After the port is placed, it appears as a small, raised area under your skin. Catheter. The catheter is a small, thin tube that connects the reservoir to a vein. Medicine that is inserted into the reservoir goes into the catheter and then into the vein. How is my port accessed? To access your port: A numbing cream may be placed on the skin over the port site. Your health care provider will put on a mask and sterile gloves. The skin over your port will be cleaned carefully with a germ-killing soap and allowed to dry. Your health care provider will gently pinch the port and insert a needle into it. Your health care provider will check for a blood return to make sure the port is in the vein and is still working (patent). If your port needs to remain accessed to get medicine continuously (constant infusion), your health care provider will place a clear bandage (dressing) over the needle site. The dressing and needle will need to be changed every week, or as told by your health care provider. What is flushing? Flushing helps keep the port working. Follow instructions from your  health care provider about how and when to flush the port. Ports are usually flushed with saline solution or a medicine called heparin. The need for flushing will depend on how the port is used: If the port is only used from time to time to give medicines or draw blood, the port may need to be flushed: Before and after medicines have been given. Before and after blood has been drawn. As part of routine maintenance. Flushing may be recommended every 4-6 weeks. If a constant infusion is running, the port may not need to be flushed. Throw away any syringes in a disposal container that is meant for sharp items (sharps container). You can buy a sharps container from a pharmacy, or you can make one by using an empty hard plastic bottle with a cover. How long will my port stay implanted? The port can stay in for as long as your health care provider thinks it is needed. When it is time for the port to come out, a surgery will be done to remove it. The surgery will be similar to the procedure that was done to put the port in. Follow these instructions at home: Caring for your port and port site Flush your port as told by your health care provider. If you need an infusion over several days, follow instructions from your health care provider about how to take care of your port site. Make sure you: Change your   dressing as told by your health care provider. Wash your hands with soap and water for at least 20 seconds before and after you change your dressing. If soap and water are not available, use alcohol-based hand sanitizer. Place any used dressings or infusion bags into a plastic bag. Throw that bag in the trash. Keep the dressing that covers the needle clean and dry. Do not get it wet. Do not use scissors or sharp objects near the infusion tubing. Keep any external tubes clamped, unless they are being used. Check your port site every day for signs of infection. Check for: Redness, swelling, or  pain. Fluid or blood. Warmth. Pus or a bad smell. Protect the skin around the port site. Avoid wearing bra straps that rub or irritate the site. Protect the skin around your port from seat belts. Place a soft pad over your chest if needed. Bathe or shower as told by your health care provider. The site may get wet as long as you are not actively receiving an infusion. General instructions  Return to your normal activities as told by your health care provider. Ask your health care provider what activities are safe for you. Carry a medical alert card or wear a medical alert bracelet at all times. This will let health care providers know that you have an implanted port in case of an emergency. Where to find more information American Cancer Society: www.cancer.org American Society of Clinical Oncology: www.cancer.net Contact a health care provider if: You have a fever or chills. You have redness, swelling, or pain at the port site. You have fluid or blood coming from your port site. Your incision feels warm to the touch. You have pus or a bad smell coming from the port site. Summary Implanted ports are usually placed in the chest for long-term IV access. Follow instructions from your health care provider about flushing the port and changing bandages (dressings). Take care of the area around your port by avoiding clothing that puts pressure on the area, and by watching for signs of infection. Protect the skin around your port from seat belts. Place a soft pad over your chest if needed. Contact a health care provider if you have a fever or you have redness, swelling, pain, fluid, or a bad smell at the port site. This information is not intended to replace advice given to you by your health care provider. Make sure you discuss any questions you have with your health care provider. Document Revised: 09/18/2020 Document Reviewed: 09/18/2020 Elsevier Patient Education  2024 Elsevier  Inc.  Pegfilgrastim Injection What is this medication? PEGFILGRASTIM (PEG fil gra stim) lowers the risk of infection in people who are receiving chemotherapy. It works by Systems analyst make more white blood cells, which protects your body from infection. It may also be used to help people who have been exposed to high doses of radiation. This medicine may be used for other purposes; ask your health care provider or pharmacist if you have questions. COMMON BRAND NAME(S): Cherly Hensen, Neulasta, Nyvepria, Stimufend, UDENYCA, UDENYCA ONBODY, Ziextenzo What should I tell my care team before I take this medication? They need to know if you have any of these conditions: Kidney disease Latex allergy Ongoing radiation therapy Sickle cell disease Skin reactions to acrylic adhesives (On-Body Injector only) An unusual or allergic reaction to pegfilgrastim, filgrastim, other medications, foods, dyes, or preservatives Pregnant or trying to get pregnant Breast-feeding How should I use this medication? This medication is for injection  under the skin. If you get this medication at home, you will be taught how to prepare and give the pre-filled syringe or how to use the On-body Injector. Refer to the patient Instructions for Use for detailed instructions. Use exactly as directed. Tell your care team immediately if you suspect that the On-body Injector may not have performed as intended or if you suspect the use of the On-body Injector resulted in a missed or partial dose. It is important that you put your used needles and syringes in a special sharps container. Do not put them in a trash can. If you do not have a sharps container, call your pharmacist or care team to get one. Talk to your care team about the use of this medication in children. While this medication may be prescribed for selected conditions, precautions do apply. Overdosage: If you think you have taken too much of this medicine contact  a poison control center or emergency room at once. NOTE: This medicine is only for you. Do not share this medicine with others. What if I miss a dose? It is important not to miss your dose. Call your care team if you miss your dose. If you miss a dose due to an On-body Injector failure or leakage, a new dose should be administered as soon as possible using a single prefilled syringe for manual use. What may interact with this medication? Interactions have not been studied. This list may not describe all possible interactions. Give your health care provider a list of all the medicines, herbs, non-prescription drugs, or dietary supplements you use. Also tell them if you smoke, drink alcohol, or use illegal drugs. Some items may interact with your medicine. What should I watch for while using this medication? Your condition will be monitored carefully while you are receiving this medication. You may need blood work done while you are taking this medication. Talk to your care team about your risk of cancer. You may be more at risk for certain types of cancer if you take this medication. If you are going to need a MRI, CT scan, or other procedure, tell your care team that you are using this medication (On-Body Injector only). What side effects may I notice from receiving this medication? Side effects that you should report to your care team as soon as possible: Allergic reactions--skin rash, itching, hives, swelling of the face, lips, tongue, or throat Capillary leak syndrome--stomach or muscle pain, unusual weakness or fatigue, feeling faint or lightheaded, decrease in the amount of urine, swelling of the ankles, hands, or feet, trouble breathing High white blood cell level--fever, fatigue, trouble breathing, night sweats, change in vision, weight loss Inflammation of the aorta--fever, fatigue, back, chest, or stomach pain, severe headache Kidney injury (glomerulonephritis)--decrease in the amount of  urine, red or dark brown urine, foamy or bubbly urine, swelling of the ankles, hands, or feet Shortness of breath or trouble breathing Spleen injury--pain in upper left stomach or shoulder Unusual bruising or bleeding Side effects that usually do not require medical attention (report to your care team if they continue or are bothersome): Bone pain Pain in the hands or feet This list may not describe all possible side effects. Call your doctor for medical advice about side effects. You may report side effects to FDA at 1-800-FDA-1088. Where should I keep my medication? Keep out of the reach of children. If you are using this medication at home, you will be instructed on how to store it. Throw away  any unused medication after the expiration date on the label. NOTE: This sheet is a summary. It may not cover all possible information. If you have questions about this medicine, talk to your doctor, pharmacist, or health care provider.  2024 Elsevier/Gold Standard (2021-02-15 00:00:00)

## 2022-12-17 ENCOUNTER — Other Ambulatory Visit: Payer: Self-pay | Admitting: Oncology

## 2022-12-17 DIAGNOSIS — C186 Malignant neoplasm of descending colon: Secondary | ICD-10-CM

## 2022-12-23 ENCOUNTER — Inpatient Hospital Stay: Payer: BC Managed Care – PPO

## 2022-12-23 ENCOUNTER — Inpatient Hospital Stay: Payer: BC Managed Care – PPO | Admitting: Nurse Practitioner

## 2022-12-24 ENCOUNTER — Inpatient Hospital Stay: Payer: BC Managed Care – PPO

## 2022-12-24 ENCOUNTER — Other Ambulatory Visit (HOSPITAL_BASED_OUTPATIENT_CLINIC_OR_DEPARTMENT_OTHER): Payer: Self-pay

## 2022-12-24 ENCOUNTER — Encounter: Payer: Self-pay | Admitting: Oncology

## 2022-12-24 ENCOUNTER — Encounter: Payer: Self-pay | Admitting: Nurse Practitioner

## 2022-12-24 ENCOUNTER — Inpatient Hospital Stay: Payer: BC Managed Care – PPO | Admitting: Nurse Practitioner

## 2022-12-24 VITALS — BP 135/86 | HR 90 | Temp 98.1°F | Resp 18 | Ht 72.0 in | Wt 212.0 lb

## 2022-12-24 VITALS — BP 132/82 | HR 89 | Resp 18

## 2022-12-24 DIAGNOSIS — Z5112 Encounter for antineoplastic immunotherapy: Secondary | ICD-10-CM | POA: Diagnosis not present

## 2022-12-24 DIAGNOSIS — C7951 Secondary malignant neoplasm of bone: Secondary | ICD-10-CM | POA: Diagnosis not present

## 2022-12-24 DIAGNOSIS — Z5111 Encounter for antineoplastic chemotherapy: Secondary | ICD-10-CM | POA: Diagnosis not present

## 2022-12-24 DIAGNOSIS — C186 Malignant neoplasm of descending colon: Secondary | ICD-10-CM

## 2022-12-24 DIAGNOSIS — Z5189 Encounter for other specified aftercare: Secondary | ICD-10-CM | POA: Diagnosis not present

## 2022-12-24 DIAGNOSIS — C61 Malignant neoplasm of prostate: Secondary | ICD-10-CM | POA: Diagnosis not present

## 2022-12-24 LAB — CBC WITH DIFFERENTIAL (CANCER CENTER ONLY)
Abs Immature Granulocytes: 0.7 10*3/uL — ABNORMAL HIGH (ref 0.00–0.07)
Band Neutrophils: 17 %
Basophils Absolute: 0.1 10*3/uL (ref 0.0–0.1)
Basophils Relative: 1 %
Eosinophils Absolute: 0.7 10*3/uL — ABNORMAL HIGH (ref 0.0–0.5)
Eosinophils Relative: 6 %
HCT: 37.4 % — ABNORMAL LOW (ref 39.0–52.0)
Hemoglobin: 12.7 g/dL — ABNORMAL LOW (ref 13.0–17.0)
Lymphocytes Relative: 9 %
Lymphs Abs: 1.1 10*3/uL (ref 0.7–4.0)
MCH: 34 pg (ref 26.0–34.0)
MCHC: 34 g/dL (ref 30.0–36.0)
MCV: 100.3 fL — ABNORMAL HIGH (ref 80.0–100.0)
Metamyelocytes Relative: 3 %
Monocytes Absolute: 0.7 10*3/uL (ref 0.1–1.0)
Monocytes Relative: 6 %
Myelocytes: 3 %
Neutro Abs: 8.8 10*3/uL — ABNORMAL HIGH (ref 1.7–7.7)
Neutrophils Relative %: 55 %
Platelet Count: 134 10*3/uL — ABNORMAL LOW (ref 150–400)
RBC: 3.73 MIL/uL — ABNORMAL LOW (ref 4.22–5.81)
RDW: 14.6 % (ref 11.5–15.5)
WBC Count: 12.2 10*3/uL — ABNORMAL HIGH (ref 4.0–10.5)
nRBC: 0.2 % (ref 0.0–0.2)

## 2022-12-24 LAB — CMP (CANCER CENTER ONLY)
ALT: 15 U/L (ref 0–44)
AST: 14 U/L — ABNORMAL LOW (ref 15–41)
Albumin: 3.7 g/dL (ref 3.5–5.0)
Alkaline Phosphatase: 195 U/L — ABNORMAL HIGH (ref 38–126)
Anion gap: 10 (ref 5–15)
BUN: 19 mg/dL (ref 6–20)
CO2: 25 mmol/L (ref 22–32)
Calcium: 8.9 mg/dL (ref 8.9–10.3)
Chloride: 98 mmol/L (ref 98–111)
Creatinine: 1.09 mg/dL (ref 0.61–1.24)
GFR, Estimated: >60 mL/min (ref >60)
Glucose, Bld: 331 mg/dL — ABNORMAL HIGH (ref 70–99)
Potassium: 4.4 mmol/L (ref 3.5–5.1)
Sodium: 133 mmol/L — ABNORMAL LOW (ref 135–145)
Total Bilirubin: 0.8 mg/dL (ref 0.3–1.2)
Total Protein: 6.7 g/dL (ref 6.5–8.1)

## 2022-12-24 LAB — CEA (ACCESS): CEA (CHCC): 3.91 ng/mL (ref 0.00–5.00)

## 2022-12-24 LAB — TOTAL PROTEIN, URINE DIPSTICK: Protein, ur: 100 mg/dL — AB

## 2022-12-24 MED ORDER — SODIUM CHLORIDE 0.9 % IV SOLN
2400.0000 mg/m2 | INTRAVENOUS | Status: DC
  Administered 2022-12-24: 5000 mg via INTRAVENOUS
  Filled 2022-12-24: qty 100

## 2022-12-24 MED ORDER — NYSTATIN 100000 UNIT/ML MT SUSP
5.0000 mL | Freq: Four times a day (QID) | OROMUCOSAL | 1 refills | Status: DC | PRN
  Filled 2022-12-24: qty 240, 12d supply, fill #0

## 2022-12-24 MED ORDER — SODIUM CHLORIDE 0.9 % IV SOLN: Freq: Once | INTRAVENOUS | Status: AC

## 2022-12-24 MED ORDER — FLUOROURACIL CHEMO INJECTION 2.5 GM/50ML
400.0000 mg/m2 | Freq: Once | INTRAVENOUS | Status: AC
  Administered 2022-12-24: 900 mg via INTRAVENOUS
  Filled 2022-12-24: qty 18

## 2022-12-24 MED ORDER — SODIUM CHLORIDE 0.9 % IV SOLN
140.0000 mg/m2 | Freq: Once | INTRAVENOUS | Status: AC
  Administered 2022-12-24: 300 mg via INTRAVENOUS
  Filled 2022-12-24: qty 15

## 2022-12-24 MED ORDER — SODIUM CHLORIDE 0.9 % IV SOLN
10.0000 mg | Freq: Once | INTRAVENOUS | Status: AC
  Administered 2022-12-24: 10 mg via INTRAVENOUS
  Filled 2022-12-24: qty 10

## 2022-12-24 MED ORDER — PALONOSETRON HCL INJECTION 0.25 MG/5ML
0.2500 mg | Freq: Once | INTRAVENOUS | Status: AC
  Administered 2022-12-24: 0.25 mg via INTRAVENOUS
  Filled 2022-12-24: qty 5

## 2022-12-24 MED ORDER — SODIUM CHLORIDE 0.9 % IV SOLN
400.0000 mg/m2 | Freq: Once | INTRAVENOUS | Status: AC
  Administered 2022-12-24: 892 mg via INTRAVENOUS
  Filled 2022-12-24: qty 44.6

## 2022-12-24 MED ORDER — ATROPINE SULFATE 1 MG/ML IV SOLN
0.5000 mg | Freq: Once | INTRAVENOUS | Status: AC | PRN
  Administered 2022-12-24: 0.5 mg via INTRAVENOUS
  Filled 2022-12-24: qty 1

## 2022-12-24 MED ORDER — SODIUM CHLORIDE 0.9 % IV SOLN
5.0000 mg/kg | Freq: Once | INTRAVENOUS | Status: AC
  Administered 2022-12-24: 500 mg via INTRAVENOUS
  Filled 2022-12-24: qty 16

## 2022-12-24 NOTE — Progress Notes (Signed)
[facility] OFFICE PROGRESS NOTE   Diagnosis: Colon cancer  INTERVAL HISTORY:   [name] returns as scheduled.  He completed another cycle of FOLFIRI/Avastin 12/10/2022.  He had mild nausea for a couple days after treatment.  Home medications effective.  He reports a single small ulcer left lower inner gumline.  No significant discomfort associated with this.  No diarrhea.  He continues to note bleeding from the cystic lesion at the left lower pelvic region.  Main complaint is progressive fatigue.  Objective:  Vital signs in last 24 hours:  Blood pressure 135/86, pulse 90, temperature 98.1 F (36.7 C), temperature source Temporal, resp. rate 18, height 6' (1.829 m), weight 212 lb (96.2 kg), SpO2 99%.    HEENT: No thrush or ulcers. Resp: Lungs clear bilaterally. Cardio: Regular rate and rhythm. GI: No hepatosplenomegaly.  Suprapubic cystic lesion with serosanguineous drainage, no surrounding erythema. Vascular: No leg edema. Skin: Palms without erythema. Port-A-Cath without erythema.  Lab Results:  Lab Results  Component Value Date   WBC 12.2 (H) 12/24/2022   HGB 12.7 (L) 12/24/2022   HCT 37.4 (L) 12/24/2022   MCV 100.3 (H) 12/24/2022   PLT 134 (L) 12/24/2022   NEUTROABS 8.8 (H) 12/24/2022    Imaging:  No results found.  Medications: I have reviewed the patient's current medications.  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, status post a left colectomy 03/04/2019 Mass felt to be in the transverse colon on a colonoscopy 01/07/2019 with a biopsy confirming poorly differentiated adenocarcinoma with signet ring cell features, mismatch repair protein expression normal CT abdomen/pelvis 01/08/2019 12.3 cm indeterminate splenic mass, present in 2015 on a lumbar MRI-felt to be benign, small subpleural and pleural-based nodules at the lung bases,  status post left nephrectomy CT chest 01/20/2019-small bilateral pulmonary nodules with rounded parenchymal nodules in the right lower lobe, indeterminate splenic mass CT chest 03/18/2019-multiple subcentimeter pulmonary nodules again seen bilaterally, greatest in the lower lobes, without significant change.  No new or enlarging pulmonary nodules or masses.  Partially visualized large heterogeneous splenic mass with no significant change.  Plan for follow-up chest CT at a 42-month interval. Cycle 1 FOLFOX 03/21/2019 Cycle 2 FOLFOX 04/04/2019 Cycle 3 FOLFOX 04/18/2019 (oxaliplatin dose reduced secondary to thrombocytopenia) Cycle 4 FOLFOX 05/02/2019 (oxaliplatin held secondary to thrombocytopenia) Cycle 5 FOLFOX 05/16/2019 Cycle 6 FOLFOX 05/30/2019 (oxaliplatin held secondary to thrombocytopenia) CT chest 06/08/2019-stable scattered small solid pulmonary nodules  Cycle 7 FOLFOX 06/13/2019 Cycle 8 FOLFOX 06/27/2019 Cycle 9 FOLFOX 07/11/2019 Cycle 10 FOLFOX 07/25/2019 (oxaliplatin held due to thrombocytopenia) Cycle 11 FOLFOX 08/08/2019 Cycle 12 FOLFOX 08/22/2019 CTs 01/09/2020-no evidence of recurrent disease, stable lung nodules favored to represent subpleural lymph nodes-dedicated follow-up not recommended CTs 01/11/2021-no evidence of recurrent disease CTs 01/24/2022-no evidence of recurrent disease CT lumbar spine, abdomen/pelvis 04/11/2022-numerous mixed lytic and sclerotic lesions scattered throughout the visualized axial and appendicular skeleton including several areas in the lumbar spine.   04/21/2022 CT biopsy sclerotic lesion right anterior iliac bone-consistent with metastatic colorectal adenocarcinoma, moderate to poorly differentiated.  Foundation 1-microsatellite stable, tumor mutation burden 4, K-ras G12S PET scan 05/09/2022-widespread hypermetabolic mixed faintly lytic and sclerotic osseous metastases throughout the axial and proximal appendicular skeleton; associated mild pathologic compression  fracture at L3; no hypermetabolic extraosseous metastatic disease. Cycle 1 FOLFIRI/bevacizumab 05/26/2022 Cycle 2 FOLFIRI/bevacizumab 06/09/2022 Cycle 3 FOLFIRI 06/24/2022, bevacizumab 06/26/2022 Cycle 4 FOLFIRI 07/08/2022, bevacizumab held due to proteinuria Cycle 5 FOLFIRI 07/22/2022,  bevacizumab held due to proteinuria CTs 07/31/2022-potentially areas of sclerosis represent interval healing; new lytic lesion at T12, multiple new areas of punched-out sclerosis throughout the visualized osseous structures.  Index lesion in the left iliac wing measures 1.3 x 2.8 cm.  Healing of previously metabolically occult metastatic disease on 05/09/2022 is a possibility. Cycle 6 FOLFIRI 08/04/2022, irinotecan dose reduced due to thrombocytopenia, bevacizumab held pending 24-hour urine result Cycle 7 FOLFIRI 08/18/2022, bevacizumab held secondary to proteinuria, G-CSF held Cycle 8 FOLFIRI 09/01/2022, bevacizumab held, G-CSF Cycle 9 FOLFIRI 09/16/2022, bevacizumab held, G-CSF CTs 09/29/2022-enlargement of a hypodense lesion posterior left lobe of the liver, similar diffuse mixed lytic and sclerotic osseous metastatic disease, multiple new small nodules medial right lung base, multiple other small bilateral pulmonary nodules unchanged. Cycle 10 FOLFIRI plus Avastin 10/08/2022 Cycle 11 FOLFIRI 10/27/2022, Avastin held Cycle 12 FOLFIRI/Avastin 11/11/2022 Cycle 13 FOLFIRI/Avastin 11/25/2022 CTs 12/08/2022-stable lesion posterior left lobe of the liver.  Unchanged diffuse lytic and sclerotic osseous metastatic disease.  Stable small solid pulmonary nodules.  Stable splenic lesions.  Slightly increased bladder wall thickening. 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  Left nephrectomy at age 56 12.  Port-A-Cath placement, Dr. Michaell Cowing, 03/16/2019 13.  Thrombocytopenia secondary to chemotherapy-oxaliplatin dose reduced beginning with cycle 3 FOLFOX, improved 14.  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  Right groin soft tissue infection May 2023-status postsurgical debridement 18.  Left leg DVT-common femoral, femoral, popliteal, posterior tibial, and peroneal veins 19.  02/04/2022-MRI cervical spine-1.7 cm T1 lesion-indeterminate; bone scan 03/14/2022-focal intense activity at approximate T1 level felt to correspond to the lesion on MRI, additional foci of activity involving the lower thoracic and lumbar spine, right iliac bone and pubic symphysis.  CT lumbar spine, abdomen/pelvis 04/11/2022-numerous mixed lytic and sclerotic lesions scattered throughout the visualized axial and appendicular skeleton including several areas in the lumbar spine.  04/21/2022 CT biopsy sclerotic lesion right anterior iliac bone-consistent with metastatic colorectal adenocarcinoma, moderate to poorly differentiated 20.  Inflamed sebaceous cyst 08/18/2022-doxycycline  Disposition: [name] appears stable.  He has completed 14 cycles of FOLFIRI/Avastin.  He is tolerating the chemotherapy without significant acute toxicity.  Plan to proceed with cycle 15 today as scheduled.  We will follow-up on the CEA from today.  CBC and chemistry panel reviewed.  Labs adequate to proceed as above.  We discussed the elevated blood sugar.  He is on multiple diabetes  medications.  He will try to avoid concentrated sweets.  He will monitor blood sugars at home and let us know about persistent high readings.  Stable proteinuria.  He will complete a 24-hour urine prior to next office visit.  He will return for follow-up and treatment in 2 weeks.  He will contact the office in the interim as outlined above or with any other problems.    Lonna Cobb ANP/GNP-BC   12/24/2022  9:51 AM

## 2022-12-24 NOTE — Progress Notes (Signed)
Patient seen by Lonna Cobb NP today  Vitals are within treatment parameters:Yes   Labs are within treatment parameters: No (Please specify and give further instructions.) protein ur 100. It's ok to proceed with tx. Patient will do a 24 hour urine.  Treatment plan has been signed: Yes   Per physician team, Patient is ready for treatment and there are NO modifications to the treatment plan.

## 2022-12-24 NOTE — Patient Instructions (Addendum)
[facility]   Discharge Instructions: Thank you for choosing [facility] to provide your oncology and hematology care.   If you have a lab appointment with the Cancer Center, please go directly to the Cancer Center and check in at the registration area.   Wear comfortable clothing and clothing appropriate for easy access to any Portacath or PICC line.   We strive to give you quality time with your provider. You may need to reschedule your appointment if you arrive late (15 or more minutes).  Arriving late affects you and other patients whose appointments are after yours.  Also, if you miss three or more appointments without notifying the office, you may be dismissed from the clinic at the provider's discretion.      For prescription refill requests, have your pharmacy contact our office and allow 72 hours for refills to be completed.    Today you received the following chemotherapy and/or immunotherapy agents Bevacizumab-awwb (MVASI), Irinotecan (CAMPTOSAR), Leucovorin & Flourouracil (ADRUCIL).      To help prevent nausea and vomiting after your treatment, we encourage you to take your nausea medication as directed.  BELOW ARE SYMPTOMS THAT SHOULD BE REPORTED IMMEDIATELY: *FEVER GREATER THAN 100.4 F (38 C) OR HIGHER *CHILLS OR SWEATING *NAUSEA AND VOMITING THAT IS NOT CONTROLLED WITH YOUR NAUSEA MEDICATION *UNUSUAL SHORTNESS OF BREATH *UNUSUAL BRUISING OR BLEEDING *URINARY PROBLEMS (pain or burning when urinating, or frequent urination) *BOWEL PROBLEMS (unusual diarrhea, constipation, pain near the anus) TENDERNESS IN MOUTH AND THROAT WITH OR WITHOUT PRESENCE OF ULCERS (sore throat, sores in mouth, or a toothache) UNUSUAL RASH, SWELLING OR PAIN  UNUSUAL VAGINAL DISCHARGE OR ITCHING   Items with * indicate a potential emergency and should be followed up as soon as possible or go to the Emergency Department if any problems should  occur.  Please show the CHEMOTHERAPY ALERT CARD or IMMUNOTHERAPY ALERT CARD at check-in to the Emergency Department and triage nurse.  Should you have questions after your visit or need to cancel or reschedule your appointment, please contact [facility]  Dept: [phone]  and follow the prompts.  Office hours are 8:00 a.m. to 4:30 p.m. Monday - Friday. Please note that voicemails left after 4:00 p.m. may not be returned until the following business day.  We are closed weekends and major holidays. You have access to a nurse at all times for urgent questions. Please call the main number to the clinic Dept: [phone] and follow the prompts.   For any non-urgent questions, you may also contact your provider using MyChart. We now offer e-Visits for anyone 71 and older to request care online for non-urgent symptoms. For details visit [email or web address].   Also download the MyChart app! Go to the app store, search "MyChart", open the app, select [place], and log in with your MyChart username and password.  Bevacizumab Injection What is this medication? BEVACIZUMAB (be va SIZ yoo mab) treats some types of cancer. It works by blocking a protein that causes cancer cells to grow and multiply. This helps to slow or stop the spread of cancer cells. It is a monoclonal antibody. This medicine may be used for other purposes; ask your health care provider or pharmacist if you have questions. COMMON BRAND NAME(S): Alymsys, Avastin, MVASI, Omer Jack What should I tell my care team before I take this medication? They need to know if you have any of these conditions: Blood clots Coughing up  blood Having or recent surgery Heart failure High blood pressure History of a connection between 2 or more body parts that do not usually connect (fistula) History of a tear in your stomach or intestines Protein in your urine An unusual or allergic reaction to bevacizumab,  other medications, foods, dyes, or preservatives Pregnant or trying to get pregnant Breast-feeding How should I use this medication? This medication is injected into a vein. It is given by your care team in a hospital or clinic setting. Talk to your care team the use of this medication in children. Special care may be needed. Overdosage: If you think you have taken too much of this medicine contact a poison control center or emergency room at once. NOTE: This medicine is only for you. Do not share this medicine with others. What if I miss a dose? Keep appointments for follow-up doses. It is important not to miss your dose. Call your care team if you are unable to keep an appointment. What may interact with this medication? Interactions are not expected. This list may not describe all possible interactions. Give your health care provider a list of all the medicines, herbs, non-prescription drugs, or dietary supplements you use. Also tell them if you smoke, drink alcohol, or use illegal drugs. Some items may interact with your medicine. What should I watch for while using this medication? Your condition will be monitored carefully while you are receiving this medication. You may need blood work while taking this medication. This medication may make you feel generally unwell. This is not uncommon as chemotherapy can affect healthy cells as well as cancer cells. Report any side effects. Continue your course of treatment even though you feel ill unless your care team tells you to stop. This medication may increase your risk to bruise or bleed. Call your care team if you notice any unusual bleeding. Before having surgery, talk to your care team to make sure it is ok. This medication can increase the risk of poor healing of your surgical site or wound. You will need to stop this medication for 28 days before surgery. After surgery, wait at least 28 days before restarting this medication. Make sure the  surgical site or wound is healed enough before restarting this medication. Talk to your care team if questions. Talk to your care team if you may be pregnant. Serious birth defects can occur if you take this medication during pregnancy and for 6 months after the last dose. Contraception is recommended while taking this medication and for 6 months after the last dose. Your care team can help you find the option that works for you. Do not breastfeed while taking this medication and for 6 months after the last dose. This medication can cause infertility. Talk to your care team if you are concerned about your fertility. What side effects may I notice from receiving this medication? Side effects that you should report to your care team as soon as possible: Allergic reactions--skin rash, itching, hives, swelling of the face, lips, tongue, or throat Bleeding--bloody or black, tar-like stools, vomiting blood or brown material that looks like coffee grounds, red or dark brown urine, small red or purple spots on skin, unusual bruising or bleeding Blood clot--pain, swelling, or warmth in the leg, shortness of breath, chest pain Heart attack--pain or tightness in the chest, shoulders, arms, or jaw, nausea, shortness of breath, cold or clammy skin, feeling faint or lightheaded Heart failure--shortness of breath, swelling of the ankles, feet, or hands,  sudden weight gain, unusual weakness or fatigue Increase in blood pressure Infection--fever, chills, cough, sore throat, wounds that don't heal, pain or trouble when passing urine, general feeling of discomfort or being unwell Infusion reactions--chest pain, shortness of breath or trouble breathing, feeling faint or lightheaded Kidney injury--decrease in the amount of urine, swelling of the ankles, hands, or feet Stomach pain that is severe, does not go away, or gets worse Stroke--sudden numbness or weakness of the face, arm, or leg, trouble speaking, confusion,  trouble walking, loss of balance or coordination, dizziness, severe headache, change in vision Sudden and severe headache, confusion, change in vision, seizures, which may be signs of posterior reversible encephalopathy syndrome (PRES) Side effects that usually do not require medical attention (report to your care team if they continue or are bothersome): Back pain Change in taste Diarrhea Dry skin Increased tears Nosebleed This list may not describe all possible side effects. Call your doctor for medical advice about side effects. You may report side effects to FDA at 1-800-FDA-1088. Where should I keep my medication? This medication is given in a hospital or clinic. It will not be stored at home. NOTE: This sheet is a summary. It may not cover all possible information. If you have questions about this medicine, talk to your doctor, pharmacist, or health care provider.  2024 Elsevier/Gold Standard (2021-08-02 00:00:00)   Irinotecan Injection What is this medication? IRINOTECAN (ir in oh TEE kan) treats some types of cancer. It works by slowing down the growth of cancer cells. This medicine may be used for other purposes; ask your health care provider or pharmacist if you have questions. COMMON BRAND NAME(S): Camptosar What should I tell my care team before I take this medication? They need to know if you have any of these conditions: Dehydration Diarrhea Infection, especially a viral infection, such as chickenpox, cold sores, herpes Liver disease Low blood cell levels (white cells, red cells, and platelets) Low levels of electrolytes, such as calcium, magnesium, or potassium in your blood Recent or ongoing radiation An unusual or allergic reaction to irinotecan, other medications, foods, dyes, or preservatives If you or your partner are pregnant or trying to get pregnant Breast-feeding How should I use this medication? This medication is injected into a vein. It is given by your  care team in a hospital or clinic setting. Talk to your care team about the use of this medication in children. Special care may be needed. Overdosage: If you think you have taken too much of this medicine contact a poison control center or emergency room at once. NOTE: This medicine is only for you. Do not share this medicine with others. What if I miss a dose? Keep appointments for follow-up doses. It is important not to miss your dose. Call your care team if you are unable to keep an appointment. What may interact with this medication? Do not take this medication with any of the following: Cobicistat Itraconazole This medication may also interact with the following: Certain antibiotics, such as clarithromycin, rifampin, rifabutin Certain antivirals for HIV or AIDS Certain medications for fungal infections, such as ketoconazole, posaconazole, voriconazole Certain medications for seizures, such as carbamazepine, phenobarbital, phenytoin Gemfibrozil Nefazodone St. John's wort This list may not describe all possible interactions. Give your health care provider a list of all the medicines, herbs, non-prescription drugs, or dietary supplements you use. Also tell them if you smoke, drink alcohol, or use illegal drugs. Some items may interact with your medicine. What should I  watch for while using this medication? Your condition will be monitored carefully while you are receiving this medication. You may need blood work while taking this medication. This medication may make you feel generally unwell. This is not uncommon as chemotherapy can affect healthy cells as well as cancer cells. Report any side effects. Continue your course of treatment even though you feel ill unless your care team tells you to stop. This medication can cause serious side effects. To reduce the risk, your care team may give you other medications to take before receiving this one. Be sure to follow the directions from your  care team. This medication may affect your coordination, reaction time, or judgement. Do not drive or operate machinery until you know how this medication affects you. Sit up or stand slowly to reduce the risk of dizzy or fainting spells. Drinking alcohol with this medication can increase the risk of these side effects. This medication may increase your risk of getting an infection. Call your care team for advice if you get a fever, chills, sore throat, or other symptoms of a cold or flu. Do not treat yourself. Try to avoid being around people who are sick. Avoid taking medications that contain aspirin, acetaminophen, ibuprofen, naproxen, or ketoprofen unless instructed by your care team. These medications may hide a fever. This medication may increase your risk to bruise or bleed. Call your care team if you notice any unusual bleeding. Be careful brushing or flossing your teeth or using a toothpick because you may get an infection or bleed more easily. If you have any dental work done, tell your dentist you are receiving this medication. Talk to your care team if you or your partner are pregnant or think either of you might be pregnant. This medication can cause serious birth defects if taken during pregnancy and for 6 months after the last dose. You will need a negative pregnancy test before starting this medication. Contraception is recommended while taking this medication and for 6 months after the last dose. Your care team can help you find the option that works for you. Do not father a child while taking this medication and for 3 months after the last dose. Use a condom for contraception during this time period. Do not breastfeed while taking this medication and for 7 days after the last dose. This medication may cause infertility. Talk to your care team if you are concerned about your fertility. What side effects may I notice from receiving this medication? Side effects that you should report to  your care team as soon as possible: Allergic reactions--skin rash, itching, hives, swelling of the face, lips, tongue, or throat Dry cough, shortness of breath or trouble breathing Increased saliva or tears, increased sweating, stomach cramping, diarrhea, small pupils, unusual weakness or fatigue, slow heartbeat Infection--fever, chills, cough, sore throat, wounds that don't heal, pain or trouble when passing urine, general feeling of discomfort or being unwell Kidney injury--decrease in the amount of urine, swelling of the ankles, hands, or feet Low red blood cell level--unusual weakness or fatigue, dizziness, headache, trouble breathing Severe or prolonged diarrhea Unusual bruising or bleeding Side effects that usually do not require medical attention (report to your care team if they continue or are bothersome): Constipation Diarrhea Hair loss Loss of appetite Nausea Stomach pain This list may not describe all possible side effects. Call your doctor for medical advice about side effects. You may report side effects to FDA at 1-800-FDA-1088. Where should I keep my  medication? This medication is given in a hospital or clinic. It will not be stored at home. NOTE: This sheet is a summary. It may not cover all possible information. If you have questions about this medicine, talk to your doctor, pharmacist, or health care provider.  2024 Elsevier/Gold Standard (2021-07-29 00:00:00)   Leucovorin Injection What is this medication? LEUCOVORIN (loo koe VOR in) prevents side effects from certain medications, such as methotrexate. It works by increasing folate levels. This helps protect healthy cells in your body. It may also be used to treat anemia caused by low levels of folate. It can also be used with fluorouracil, a type of chemotherapy, to treat colorectal cancer. It works by increasing the effects of fluorouracil in the body. This medicine may be used for other purposes; ask your health care  provider or pharmacist if you have questions. What should I tell my care team before I take this medication? They need to know if you have any of these conditions: Anemia from low levels of vitamin B12 in the blood An unusual or allergic reaction to leucovorin, folic acid, other medications, foods, dyes, or preservatives Pregnant or trying to get pregnant Breastfeeding How should I use this medication? This medication is injected into a vein or a muscle. It is given by your care team in a hospital or clinic setting. Talk to your care team about the use of this medication in children. Special care may be needed. Overdosage: If you think you have taken too much of this medicine contact a poison control center or emergency room at once. NOTE: This medicine is only for you. Do not share this medicine with others. What if I miss a dose? Keep appointments for follow-up doses. It is important not to miss your dose. Call your care team if you are unable to keep an appointment. What may interact with this medication? Capecitabine Fluorouracil Phenobarbital Phenytoin Primidone Trimethoprim;sulfamethoxazole This list may not describe all possible interactions. Give your health care provider a list of all the medicines, herbs, non-prescription drugs, or dietary supplements you use. Also tell them if you smoke, drink alcohol, or use illegal drugs. Some items may interact with your medicine. What should I watch for while using this medication? Your condition will be monitored carefully while you are receiving this medication. This medication may increase the side effects of 5-fluorouracil. Tell your care team if you have diarrhea or mouth sores that do not get better or that get worse. What side effects may I notice from receiving this medication? Side effects that you should report to your care team as soon as possible: Allergic reactions--skin rash, itching, hives, swelling of the face, lips, tongue,  or throat This list may not describe all possible side effects. Call your doctor for medical advice about side effects. You may report side effects to FDA at 1-800-FDA-1088. Where should I keep my medication? This medication is given in a hospital or clinic. It will not be stored at home. NOTE: This sheet is a summary. It may not cover all possible information. If you have questions about this medicine, talk to your doctor, pharmacist, or health care provider.  2024 Elsevier/Gold Standard (2021-08-20 00:00:00)   Fluorouracil Injection What is this medication? FLUOROURACIL (flure oh YOOR a sil) treats some types of cancer. It works by slowing down the growth of cancer cells. This medicine may be used for other purposes; ask your health care provider or pharmacist if you have questions. COMMON BRAND NAME(S): Adrucil What  should I tell my care team before I take this medication? They need to know if you have any of these conditions: Blood disorders Dihydropyrimidine dehydrogenase (DPD) deficiency Infection, such as chickenpox, cold sores, herpes Kidney disease Liver disease Poor nutrition Recent or ongoing radiation therapy An unusual or allergic reaction to fluorouracil, other medications, foods, dyes, or preservatives If you or your partner are pregnant or trying to get pregnant Breast-feeding How should I use this medication? This medication is injected into a vein. It is administered by your care team in a hospital or clinic setting. Talk to your care team about the use of this medication in children. Special care may be needed. Overdosage: If you think you have taken too much of this medicine contact a poison control center or emergency room at once. NOTE: This medicine is only for you. Do not share this medicine with others. What if I miss a dose? Keep appointments for follow-up doses. It is important not to miss your dose. Call your care team if you are unable to keep an  appointment. What may interact with this medication? Do not take this medication with any of the following: Live virus vaccines This medication may also interact with the following: Medications that treat or prevent blood clots, such as warfarin, enoxaparin, dalteparin This list may not describe all possible interactions. Give your health care provider a list of all the medicines, herbs, non-prescription drugs, or dietary supplements you use. Also tell them if you smoke, drink alcohol, or use illegal drugs. Some items may interact with your medicine. What should I watch for while using this medication? Your condition will be monitored carefully while you are receiving this medication. This medication may make you feel generally unwell. This is not uncommon as chemotherapy can affect healthy cells as well as cancer cells. Report any side effects. Continue your course of treatment even though you feel ill unless your care team tells you to stop. In some cases, you may be given additional medications to help with side effects. Follow all directions for their use. This medication may increase your risk of getting an infection. Call your care team for advice if you get a fever, chills, sore throat, or other symptoms of a cold or flu. Do not treat yourself. Try to avoid being around people who are sick. This medication may increase your risk to bruise or bleed. Call your care team if you notice any unusual bleeding. Be careful brushing or flossing your teeth or using a toothpick because you may get an infection or bleed more easily. If you have any dental work done, tell your dentist you are receiving this medication. Avoid taking medications that contain aspirin, acetaminophen, ibuprofen, naproxen, or ketoprofen unless instructed by your care team. These medications may hide a fever. Do not treat diarrhea with over the counter products. Contact your care team if you have diarrhea that lasts more than 2  days or if it is severe and watery. This medication can make you more sensitive to the sun. Keep out of the sun. If you cannot avoid being in the sun, wear protective clothing and sunscreen. Do not use sun lamps, tanning beds, or tanning booths. Talk to your care team if you or your partner wish to become pregnant or think you might be pregnant. This medication can cause serious birth defects if taken during pregnancy and for 3 months after the last dose. A reliable form of contraception is recommended while taking this medication and for  3 months after the last dose. Talk to your care team about effective forms of contraception. Do not father a child while taking this medication and for 3 months after the last dose. Use a condom while having sex during this time period. Do not breastfeed while taking this medication. This medication may cause infertility. Talk to your care team if you are concerned about your fertility. What side effects may I notice from receiving this medication? Side effects that you should report to your care team as soon as possible: Allergic reactions--skin rash, itching, hives, swelling of the face, lips, tongue, or throat Heart attack--pain or tightness in the chest, shoulders, arms, or jaw, nausea, shortness of breath, cold or clammy skin, feeling faint or lightheaded Heart failure--shortness of breath, swelling of the ankles, feet, or hands, sudden weight gain, unusual weakness or fatigue Heart rhythm changes--fast or irregular heartbeat, dizziness, feeling faint or lightheaded, chest pain, trouble breathing High ammonia level--unusual weakness or fatigue, confusion, loss of appetite, nausea, vomiting, seizures Infection--fever, chills, cough, sore throat, wounds that don't heal, pain or trouble when passing urine, general feeling of discomfort or being unwell Low red blood cell level--unusual weakness or fatigue, dizziness, headache, trouble breathing Pain, tingling, or  numbness in the hands or feet, muscle weakness, change in vision, confusion or trouble speaking, loss of balance or coordination, trouble walking, seizures Redness, swelling, and blistering of the skin over hands and feet Severe or prolonged diarrhea Unusual bruising or bleeding Side effects that usually do not require medical attention (report to your care team if they continue or are bothersome): Dry skin Headache Increased tears Nausea Pain, redness, or swelling with sores inside the mouth or throat Sensitivity to light Vomiting This list may not describe all possible side effects. Call your doctor for medical advice about side effects. You may report side effects to FDA at 1-800-FDA-1088. Where should I keep my medication? This medication is given in a hospital or clinic. It will not be stored at home. NOTE: This sheet is a summary. It may not cover all possible information. If you have questions about this medicine, talk to your doctor, pharmacist, or health care provider.  2024 [employer]vier/Gold Standard (2021-07-23 00:00:00)   The chemotherapy medication bag should finish at 46 hours, 96 hours, or 7 days. For example, if your pump is scheduled for 46 hours and it was put on at 4:00 p.m., it should finish at 2:00 p.m. the day it is scheduled to come off regardless of your appointment time.     Estimated time to finish at 12:00 p.m. on Friday 12/26/2022.   If the display on your pump reads "Low Volume" and it is beeping, take the batteries out of the pump and come to the cancer center for it to be taken off.   If the pump alarms go off prior to the pump reading "Low Volume" then call [phone] and someone can assist you.  If the plunger comes out and the chemotherapy medication is leaking out, please use your home chemo spill kit to clean up the spill. Do NOT use paper towels or other household products.  If you have problems or questions regarding your pump, please call either  [phone] (24 hours a day) or the cancer center Monday-Friday 8:00 a.m.- 4:30 p.m. at the clinic number and we will assist you. If you are unable to get assistance, then go to the nearest Emergency Department and ask the staff to contact the IV team for assistance.

## 2022-12-25 ENCOUNTER — Other Ambulatory Visit: Payer: Self-pay

## 2022-12-25 DIAGNOSIS — C186 Malignant neoplasm of descending colon: Secondary | ICD-10-CM | POA: Diagnosis not present

## 2022-12-26 ENCOUNTER — Inpatient Hospital Stay: Payer: BC Managed Care – PPO

## 2022-12-26 VITALS — BP 123/87 | HR 77 | Temp 97.8°F | Resp 18

## 2022-12-26 DIAGNOSIS — C61 Malignant neoplasm of prostate: Secondary | ICD-10-CM | POA: Diagnosis not present

## 2022-12-26 DIAGNOSIS — Z5111 Encounter for antineoplastic chemotherapy: Secondary | ICD-10-CM | POA: Diagnosis not present

## 2022-12-26 DIAGNOSIS — C186 Malignant neoplasm of descending colon: Secondary | ICD-10-CM | POA: Diagnosis not present

## 2022-12-26 DIAGNOSIS — C7951 Secondary malignant neoplasm of bone: Secondary | ICD-10-CM | POA: Diagnosis not present

## 2022-12-26 DIAGNOSIS — Z5112 Encounter for antineoplastic immunotherapy: Secondary | ICD-10-CM | POA: Diagnosis not present

## 2022-12-26 DIAGNOSIS — Z5189 Encounter for other specified aftercare: Secondary | ICD-10-CM | POA: Diagnosis not present

## 2022-12-26 MED ORDER — HEPARIN SOD (PORK) LOCK FLUSH 100 UNIT/ML IV SOLN
500.0000 [IU] | Freq: Once | INTRAVENOUS | Status: AC | PRN
  Administered 2022-12-26: 500 [IU]

## 2022-12-26 MED ORDER — PEGFILGRASTIM-CBQV 6 MG/0.6ML ~~LOC~~ SOSY
6.0000 mg | PREFILLED_SYRINGE | Freq: Once | SUBCUTANEOUS | Status: AC
  Administered 2022-12-26: 6 mg via SUBCUTANEOUS
  Filled 2022-12-26: qty 0.6

## 2022-12-26 MED ORDER — SODIUM CHLORIDE 0.9% FLUSH
10.0000 mL | INTRAVENOUS | Status: DC | PRN
  Administered 2022-12-26: 10 mL

## 2022-12-26 NOTE — Patient Instructions (Addendum)

## 2022-12-31 ENCOUNTER — Other Ambulatory Visit: Payer: Self-pay | Admitting: *Deleted

## 2022-12-31 DIAGNOSIS — Z9889 Other specified postprocedural states: Secondary | ICD-10-CM | POA: Insufficient documentation

## 2022-12-31 DIAGNOSIS — C186 Malignant neoplasm of descending colon: Secondary | ICD-10-CM | POA: Diagnosis not present

## 2022-12-31 DIAGNOSIS — R0609 Other forms of dyspnea: Secondary | ICD-10-CM | POA: Insufficient documentation

## 2022-12-31 DIAGNOSIS — I10 Essential (primary) hypertension: Secondary | ICD-10-CM | POA: Diagnosis not present

## 2022-12-31 DIAGNOSIS — I480 Paroxysmal atrial fibrillation: Secondary | ICD-10-CM | POA: Diagnosis not present

## 2022-12-31 DIAGNOSIS — Z5111 Encounter for antineoplastic chemotherapy: Secondary | ICD-10-CM | POA: Insufficient documentation

## 2022-12-31 DIAGNOSIS — C787 Secondary malignant neoplasm of liver and intrahepatic bile duct: Secondary | ICD-10-CM | POA: Diagnosis not present

## 2022-12-31 DIAGNOSIS — Z5189 Encounter for other specified aftercare: Secondary | ICD-10-CM | POA: Insufficient documentation

## 2022-12-31 DIAGNOSIS — Z5112 Encounter for antineoplastic immunotherapy: Secondary | ICD-10-CM | POA: Insufficient documentation

## 2022-12-31 LAB — PROTEIN, URINE, 24 HOUR
Collection Interval-UPROT: 24 h
Protein, 24H Urine: 710 mg/d — ABNORMAL HIGH (ref 50–100)
Protein, Urine: 33 mg/dL
Urine Total Volume-UPROT: 2150 mL

## 2023-01-04 ENCOUNTER — Other Ambulatory Visit: Payer: Self-pay | Admitting: Oncology

## 2023-01-04 DIAGNOSIS — C186 Malignant neoplasm of descending colon: Secondary | ICD-10-CM

## 2023-01-06 MED FILL — Dexamethasone Sodium Phosphate Inj 100 MG/10ML: INTRAMUSCULAR | Qty: 1 | Status: AC

## 2023-01-07 ENCOUNTER — Inpatient Hospital Stay: Payer: BC Managed Care – PPO | Admitting: Nurse Practitioner

## 2023-01-07 ENCOUNTER — Inpatient Hospital Stay: Payer: BC Managed Care – PPO

## 2023-01-07 ENCOUNTER — Inpatient Hospital Stay: Payer: BC Managed Care – PPO | Attending: Oncology

## 2023-01-07 ENCOUNTER — Encounter: Payer: Self-pay | Admitting: Nurse Practitioner

## 2023-01-07 VITALS — BP 133/92 | HR 75 | Resp 18

## 2023-01-07 DIAGNOSIS — Z9889 Other specified postprocedural states: Secondary | ICD-10-CM | POA: Diagnosis not present

## 2023-01-07 DIAGNOSIS — C787 Secondary malignant neoplasm of liver and intrahepatic bile duct: Secondary | ICD-10-CM | POA: Diagnosis not present

## 2023-01-07 DIAGNOSIS — I480 Paroxysmal atrial fibrillation: Secondary | ICD-10-CM | POA: Diagnosis not present

## 2023-01-07 DIAGNOSIS — Z5189 Encounter for other specified aftercare: Secondary | ICD-10-CM | POA: Diagnosis not present

## 2023-01-07 DIAGNOSIS — C186 Malignant neoplasm of descending colon: Secondary | ICD-10-CM

## 2023-01-07 DIAGNOSIS — Z5112 Encounter for antineoplastic immunotherapy: Secondary | ICD-10-CM | POA: Diagnosis not present

## 2023-01-07 DIAGNOSIS — R0609 Other forms of dyspnea: Secondary | ICD-10-CM | POA: Diagnosis not present

## 2023-01-07 DIAGNOSIS — I10 Essential (primary) hypertension: Secondary | ICD-10-CM | POA: Diagnosis not present

## 2023-01-07 DIAGNOSIS — Z5111 Encounter for antineoplastic chemotherapy: Secondary | ICD-10-CM | POA: Diagnosis not present

## 2023-01-07 LAB — CEA (ACCESS): CEA (CHCC): 8.1 ng/mL — ABNORMAL HIGH (ref 0.00–5.00)

## 2023-01-07 LAB — CBC WITH DIFFERENTIAL (CANCER CENTER ONLY)
Abs Immature Granulocytes: 1.39 10*3/uL — ABNORMAL HIGH (ref 0.00–0.07)
Basophils Absolute: 0.1 10*3/uL (ref 0.0–0.1)
Basophils Relative: 1 %
Eosinophils Absolute: 0.4 10*3/uL (ref 0.0–0.5)
Eosinophils Relative: 4 %
HCT: 36.4 % — ABNORMAL LOW (ref 39.0–52.0)
Hemoglobin: 12.1 g/dL — ABNORMAL LOW (ref 13.0–17.0)
Immature Granulocytes: 13 %
Lymphocytes Relative: 9 %
Lymphs Abs: 1 10*3/uL (ref 0.7–4.0)
MCH: 33.4 pg (ref 26.0–34.0)
MCHC: 33.2 g/dL (ref 30.0–36.0)
MCV: 100.6 fL — ABNORMAL HIGH (ref 80.0–100.0)
Monocytes Absolute: 1.2 10*3/uL — ABNORMAL HIGH (ref 0.1–1.0)
Monocytes Relative: 11 %
Neutro Abs: 7 10*3/uL (ref 1.7–7.7)
Neutrophils Relative %: 62 %
Platelet Count: 132 10*3/uL — ABNORMAL LOW (ref 150–400)
RBC: 3.62 MIL/uL — ABNORMAL LOW (ref 4.22–5.81)
RDW: 14.6 % (ref 11.5–15.5)
WBC Count: 11.1 10*3/uL — ABNORMAL HIGH (ref 4.0–10.5)
nRBC: 0.3 % — ABNORMAL HIGH (ref 0.0–0.2)

## 2023-01-07 LAB — CMP (CANCER CENTER ONLY)
ALT: 12 U/L (ref 0–44)
AST: 16 U/L (ref 15–41)
Albumin: 3.5 g/dL (ref 3.5–5.0)
Alkaline Phosphatase: 222 U/L — ABNORMAL HIGH (ref 38–126)
Anion gap: 9 (ref 5–15)
BUN: 18 mg/dL (ref 6–20)
CO2: 25 mmol/L (ref 22–32)
Calcium: 8.6 mg/dL — ABNORMAL LOW (ref 8.9–10.3)
Chloride: 100 mmol/L (ref 98–111)
Creatinine: 1.04 mg/dL (ref 0.61–1.24)
GFR, Estimated: >60 mL/min (ref >60)
Glucose, Bld: 221 mg/dL — ABNORMAL HIGH (ref 70–99)
Potassium: 4.3 mmol/L (ref 3.5–5.1)
Sodium: 134 mmol/L — ABNORMAL LOW (ref 135–145)
Total Bilirubin: 0.7 mg/dL (ref 0.3–1.2)
Total Protein: 6.4 g/dL — ABNORMAL LOW (ref 6.5–8.1)

## 2023-01-07 LAB — TOTAL PROTEIN, URINE DIPSTICK: Protein, ur: 100 mg/dL — AB

## 2023-01-07 MED ORDER — ATROPINE SULFATE 1 MG/ML IV SOLN
0.5000 mg | Freq: Once | INTRAVENOUS | Status: AC | PRN
  Administered 2023-01-07: 0.5 mg via INTRAVENOUS
  Filled 2023-01-07: qty 1

## 2023-01-07 MED ORDER — SODIUM CHLORIDE 0.9 % IV SOLN
400.0000 mg/m2 | Freq: Once | INTRAVENOUS | Status: AC
  Administered 2023-01-07: 892 mg via INTRAVENOUS
  Filled 2023-01-07 (×2): qty 44.6

## 2023-01-07 MED ORDER — PALONOSETRON HCL INJECTION 0.25 MG/5ML
0.2500 mg | Freq: Once | INTRAVENOUS | Status: AC
  Administered 2023-01-07: 0.25 mg via INTRAVENOUS
  Filled 2023-01-07: qty 5

## 2023-01-07 MED ORDER — SODIUM CHLORIDE 0.9 % IV SOLN
10.0000 mg | Freq: Once | INTRAVENOUS | Status: AC
  Administered 2023-01-07: 10 mg via INTRAVENOUS
  Filled 2023-01-07: qty 10

## 2023-01-07 MED ORDER — SODIUM CHLORIDE 0.9 % IV SOLN
2400.0000 mg/m2 | INTRAVENOUS | Status: DC
  Administered 2023-01-07: 5000 mg via INTRAVENOUS
  Filled 2023-01-07: qty 100

## 2023-01-07 MED ORDER — SODIUM CHLORIDE 0.9 % IV SOLN: Freq: Once | INTRAVENOUS | Status: AC

## 2023-01-07 MED ORDER — SODIUM CHLORIDE 0.9 % IV SOLN
140.0000 mg/m2 | Freq: Once | INTRAVENOUS | Status: AC
  Administered 2023-01-07: 300 mg via INTRAVENOUS
  Filled 2023-01-07: qty 15

## 2023-01-07 MED ORDER — SODIUM CHLORIDE 0.9 % IV SOLN
5.0000 mg/kg | Freq: Once | INTRAVENOUS | Status: AC
  Administered 2023-01-07: 500 mg via INTRAVENOUS
  Filled 2023-01-07: qty 16

## 2023-01-07 MED ORDER — FLUOROURACIL CHEMO INJECTION 2.5 GM/50ML
400.0000 mg/m2 | Freq: Once | INTRAVENOUS | Status: AC
  Administered 2023-01-07: 900 mg via INTRAVENOUS
  Filled 2023-01-07: qty 18

## 2023-01-07 NOTE — Progress Notes (Signed)
[facility] OFFICE PROGRESS NOTE   Diagnosis: Colon cancer  INTERVAL HISTORY:   [name] returns as scheduled.  He completed another cycle of FOLFIRI/Avastin 12/24/2022.  He tends to have mild nausea for few days after each treatment.  No mouth sores.  A mouth sore he had previously has resolved.  No diarrhea.  Blood with nose blowing.  Intermittent pain at the left buttock, radicular at times.  The pain occurs with sitting.  No weakness or numbness.  No bowel or bladder dysfunction.  Objective:  Vital signs in last 24 hours:  Blood pressure 135/81, pulse 78, temperature 98.2 F (36.8 C), temperature source Temporal, resp. rate 18, height 6' (1.829 m), weight 213 lb 6.4 oz (96.8 kg), SpO2 100%.    HEENT: No thrush or ulcers. Resp: Lungs clear bilaterally. Cardio: Regular rate and rhythm. GI: No hepatosplenomegaly. Vascular: No leg edema. Neuro: Lower extremity motor strength 5/5. Skin: Suprapubic cystic lesion with serosanguineous drainage. Port-A-Cath without erythema.  Lab Results:  Lab Results  Component Value Date   WBC 11.1 (H) 01/07/2023   HGB 12.1 (L) 01/07/2023   HCT 36.4 (L) 01/07/2023   MCV 100.6 (H) 01/07/2023   PLT 132 (L) 01/07/2023   NEUTROABS PENDING 01/07/2023    Imaging:  No results found.  Medications: I have reviewed the patient's current medications.  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, status post a left colectomy 03/04/2019 Mass felt to be in the transverse colon on a colonoscopy 01/07/2019 with a biopsy confirming poorly differentiated adenocarcinoma with signet ring cell features, mismatch repair protein expression normal CT abdomen/pelvis 01/08/2019 12.3 cm indeterminate splenic mass, present in 2015 on a lumbar MRI-felt to be benign, small subpleural and pleural-based nodules at the lung bases, status post  left nephrectomy CT chest 01/20/2019-small bilateral pulmonary nodules with rounded parenchymal nodules in the right lower lobe, indeterminate splenic mass CT chest 03/18/2019-multiple subcentimeter pulmonary nodules again seen bilaterally, greatest in the lower lobes, without significant change.  No new or enlarging pulmonary nodules or masses.  Partially visualized large heterogeneous splenic mass with no significant change.  Plan for follow-up chest CT at a 40-month interval. Cycle 1 FOLFOX 03/21/2019 Cycle 2 FOLFOX 04/04/2019 Cycle 3 FOLFOX 04/18/2019 (oxaliplatin dose reduced secondary to thrombocytopenia) Cycle 4 FOLFOX 05/02/2019 (oxaliplatin held secondary to thrombocytopenia) Cycle 5 FOLFOX 05/16/2019 Cycle 6 FOLFOX 05/30/2019 (oxaliplatin held secondary to thrombocytopenia) CT chest 06/08/2019-stable scattered small solid pulmonary nodules  Cycle 7 FOLFOX 06/13/2019 Cycle 8 FOLFOX 06/27/2019 Cycle 9 FOLFOX 07/11/2019 Cycle 10 FOLFOX 07/25/2019 (oxaliplatin held due to thrombocytopenia) Cycle 11 FOLFOX 08/08/2019 Cycle 12 FOLFOX 08/22/2019 CTs 01/09/2020-no evidence of recurrent disease, stable lung nodules favored to represent subpleural lymph nodes-dedicated follow-up not recommended CTs 01/11/2021-no evidence of recurrent disease CTs 01/24/2022-no evidence of recurrent disease CT lumbar spine, abdomen/pelvis 04/11/2022-numerous mixed lytic and sclerotic lesions scattered throughout the visualized axial and appendicular skeleton including several areas in the lumbar spine.   04/21/2022 CT biopsy sclerotic lesion right anterior iliac bone-consistent with metastatic colorectal adenocarcinoma, moderate to poorly differentiated.  Foundation 1-microsatellite stable, tumor mutation burden 4, K-ras G12S PET scan 05/09/2022-widespread hypermetabolic mixed faintly lytic and sclerotic osseous metastases throughout the axial and proximal appendicular skeleton; associated mild pathologic compression fracture at L3;  no hypermetabolic extraosseous metastatic disease. Cycle 1 FOLFIRI/bevacizumab 05/26/2022 Cycle 2 FOLFIRI/bevacizumab 06/09/2022 Cycle 3 FOLFIRI 06/24/2022, bevacizumab 06/26/2022 Cycle 4 FOLFIRI 07/08/2022, bevacizumab held due to proteinuria Cycle  5 FOLFIRI 07/22/2022, bevacizumab held due to proteinuria CTs 07/31/2022-potentially areas of sclerosis represent interval healing; new lytic lesion at T12, multiple new areas of punched-out sclerosis throughout the visualized osseous structures.  Index lesion in the left iliac wing measures 1.3 x 2.8 cm.  Healing of previously metabolically occult metastatic disease on 05/09/2022 is a possibility. Cycle 6 FOLFIRI 08/04/2022, irinotecan dose reduced due to thrombocytopenia, bevacizumab held pending 24-hour urine result Cycle 7 FOLFIRI 08/18/2022, bevacizumab held secondary to proteinuria, G-CSF held Cycle 8 FOLFIRI 09/01/2022, bevacizumab held, G-CSF Cycle 9 FOLFIRI 09/16/2022, bevacizumab held, G-CSF CTs 09/29/2022-enlargement of a hypodense lesion posterior left lobe of the liver, similar diffuse mixed lytic and sclerotic osseous metastatic disease, multiple new small nodules medial right lung base, multiple other small bilateral pulmonary nodules unchanged. Cycle 10 FOLFIRI plus Avastin 10/08/2022 Cycle 11 FOLFIRI 10/27/2022, Avastin held Cycle 12 FOLFIRI/Avastin 11/11/2022 Cycle 13 FOLFIRI/Avastin 11/25/2022 CTs 12/08/2022-stable lesion posterior left lobe of the liver.  Unchanged diffuse lytic and sclerotic osseous metastatic disease.  Stable small solid pulmonary nodules.  Stable splenic lesions.  Slightly increased bladder wall thickening. 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  Left nephrectomy at age 26 12.  Port-A-Cath placement, Dr. Michaell Cowing, 03/16/2019 13.  Thrombocytopenia secondary to chemotherapy-oxaliplatin dose reduced beginning with cycle 3 FOLFOX, improved 14.  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  Right groin soft tissue infection May 2023-status postsurgical debridement 18.  Left leg DVT-common femoral, femoral, popliteal, posterior tibial, and peroneal veins 19.  02/04/2022-MRI cervical spine-1.7 cm T1 lesion-indeterminate; bone scan 03/14/2022-focal intense activity at approximate T1 level felt to correspond to the lesion on MRI, additional foci of activity involving the lower thoracic and lumbar spine, right iliac bone and pubic symphysis.  CT lumbar spine, abdomen/pelvis 04/11/2022-numerous mixed lytic and sclerotic lesions scattered throughout the visualized axial and appendicular skeleton including several areas in the lumbar spine.  04/21/2022 CT biopsy sclerotic lesion right anterior iliac bone-consistent with metastatic colorectal adenocarcinoma, moderate to poorly differentiated 20.  Inflamed sebaceous cyst 08/18/2022-doxycycline  Disposition: Mr. Queenan appears stable.  He has completed 15 cycles of FOLFIRI/Avastin.  Overall tolerating treatment well.  Most recent CEA improved.  Plan to proceed with cycle 16 today as scheduled.  CBC and chemistry panel reviewed.  Labs adequate to proceed as above.  We reviewed the 24-hour urine protein result from  last week which overall is stable.  He has intermittent pain at the left buttock with a radicular component.  Monitor for now.  He understands to contact the office with worrisome symptoms including increased pain, leg weakness/numbness, bowel/bladder dysfunction.  He will return for follow-up and treatment in 2 weeks.  Overall plan is for restaging CTs after cycle 17.    Lonna Cobb ANP/GNP-BC   01/07/2023  8:45 AM

## 2023-01-07 NOTE — Patient Instructions (Signed)
[place] [facility]   Discharge Instructions: Thank you for choosing [facility] to provide your oncology and hematology care.   If you have a lab appointment with the Cancer Center, please go directly to the Cancer Center and check in at the registration area.   Wear comfortable clothing and clothing appropriate for easy access to any Portacath or PICC line.   We strive to give you quality time with your provider. You may need to reschedule your appointment if you arrive late (15 or more minutes).  Arriving late affects you and other patients whose appointments are after yours.  Also, if you miss three or more appointments without notifying the office, you may be dismissed from the clinic at the provider's discretion.      For prescription refill requests, have your pharmacy contact our office and allow 72 hours for refills to be completed.    Today you received the following chemotherapy and/or immunotherapy agents Bevacizumab-awwb (MVASI), Irinotecan (CAMPTOSAR), Leucovorin & Flourouracil (ADRUCIL).      To help prevent nausea and vomiting after your treatment, we encourage you to take your nausea medication as directed.  BELOW ARE SYMPTOMS THAT SHOULD BE REPORTED IMMEDIATELY: *FEVER GREATER THAN 100.4 F (38 C) OR HIGHER *CHILLS OR SWEATING *NAUSEA AND VOMITING THAT IS NOT CONTROLLED WITH YOUR NAUSEA MEDICATION *UNUSUAL SHORTNESS OF BREATH *UNUSUAL BRUISING OR BLEEDING *URINARY PROBLEMS (pain or burning when urinating, or frequent urination) *BOWEL PROBLEMS (unusual diarrhea, constipation, pain near the anus) TENDERNESS IN MOUTH AND THROAT WITH OR WITHOUT PRESENCE OF ULCERS (sore throat, sores in mouth, or a toothache) UNUSUAL RASH, SWELLING OR PAIN  UNUSUAL VAGINAL DISCHARGE OR ITCHING   Items with * indicate a potential emergency and should be followed up as soon as possible or go to the Emergency Department if any problems should  occur.  Please show the CHEMOTHERAPY ALERT CARD or IMMUNOTHERAPY ALERT CARD at check-in to the Emergency Department and triage nurse.  Should you have questions after your visit or need to cancel or reschedule your appointment, please contact [place] [facility]  Dept: [phone]  and follow the prompts.  Office hours are 8:00 a.m. to 4:30 p.m. Monday - Friday. Please note that voicemails left after 4:00 p.m. may not be returned until the following business day.  We are closed weekends and major holidays. You have access to a nurse at all times for urgent questions. Please call the main number to the clinic Dept: [phone] and follow the prompts.   For any non-urgent questions, you may also contact your provider using MyChart. We now offer e-Visits for anyone 49 and older to request care online for non-urgent symptoms. For details visit [email or web address].   Also download the MyChart app! Go to the app store, search "MyChart", open the app, select [facility], and log in with your MyChart username and password.  Bevacizumab Injection What is this medication? BEVACIZUMAB (be va SIZ yoo mab) treats some types of cancer. It works by blocking a protein that causes cancer cells to grow and multiply. This helps to slow or stop the spread of cancer cells. It is a monoclonal antibody. This medicine may be used for other purposes; ask your health care provider or pharmacist if you have questions. COMMON BRAND NAME(S): Alymsys, Avastin, MVASI, Omer Jack What should I tell my care team before I take this medication? They need to know if you have any of these conditions: Blood clots Coughing up  blood Having or recent surgery Heart failure High blood pressure History of a connection between 2 or more body parts that do not usually connect (fistula) History of a tear in your stomach or intestines Protein in your urine An unusual or allergic reaction to bevacizumab,  other medications, foods, dyes, or preservatives Pregnant or trying to get pregnant Breast-feeding How should I use this medication? This medication is injected into a vein. It is given by your care team in a hospital or clinic setting. Talk to your care team the use of this medication in children. Special care may be needed. Overdosage: If you think you have taken too much of this medicine contact a poison control center or emergency room at once. NOTE: This medicine is only for you. Do not share this medicine with others. What if I miss a dose? Keep appointments for follow-up doses. It is important not to miss your dose. Call your care team if you are unable to keep an appointment. What may interact with this medication? Interactions are not expected. This list may not describe all possible interactions. Give your health care provider a list of all the medicines, herbs, non-prescription drugs, or dietary supplements you use. Also tell them if you smoke, drink alcohol, or use illegal drugs. Some items may interact with your medicine. What should I watch for while using this medication? Your condition will be monitored carefully while you are receiving this medication. You may need blood work while taking this medication. This medication may make you feel generally unwell. This is not uncommon as chemotherapy can affect healthy cells as well as cancer cells. Report any side effects. Continue your course of treatment even though you feel ill unless your care team tells you to stop. This medication may increase your risk to bruise or bleed. Call your care team if you notice any unusual bleeding. Before having surgery, talk to your care team to make sure it is ok. This medication can increase the risk of poor healing of your surgical site or wound. You will need to stop this medication for 28 days before surgery. After surgery, wait at least 28 days before restarting this medication. Make sure the  surgical site or wound is healed enough before restarting this medication. Talk to your care team if questions. Talk to your care team if you may be pregnant. Serious birth defects can occur if you take this medication during pregnancy and for 6 months after the last dose. Contraception is recommended while taking this medication and for 6 months after the last dose. Your care team can help you find the option that works for you. Do not breastfeed while taking this medication and for 6 months after the last dose. This medication can cause infertility. Talk to your care team if you are concerned about your fertility. What side effects may I notice from receiving this medication? Side effects that you should report to your care team as soon as possible: Allergic reactions--skin rash, itching, hives, swelling of the face, lips, tongue, or throat Bleeding--bloody or black, tar-like stools, vomiting blood or brown material that looks like coffee grounds, red or dark brown urine, small red or purple spots on skin, unusual bruising or bleeding Blood clot--pain, swelling, or warmth in the leg, shortness of breath, chest pain Heart attack--pain or tightness in the chest, shoulders, arms, or jaw, nausea, shortness of breath, cold or clammy skin, feeling faint or lightheaded Heart failure--shortness of breath, swelling of the ankles, feet, or hands,  sudden weight gain, unusual weakness or fatigue Increase in blood pressure Infection--fever, chills, cough, sore throat, wounds that don't heal, pain or trouble when passing urine, general feeling of discomfort or being unwell Infusion reactions--chest pain, shortness of breath or trouble breathing, feeling faint or lightheaded Kidney injury--decrease in the amount of urine, swelling of the ankles, hands, or feet Stomach pain that is severe, does not go away, or gets worse Stroke--sudden numbness or weakness of the face, arm, or leg, trouble speaking, confusion,  trouble walking, loss of balance or coordination, dizziness, severe headache, change in vision Sudden and severe headache, confusion, change in vision, seizures, which may be signs of posterior reversible encephalopathy syndrome (PRES) Side effects that usually do not require medical attention (report to your care team if they continue or are bothersome): Back pain Change in taste Diarrhea Dry skin Increased tears Nosebleed This list may not describe all possible side effects. Call your doctor for medical advice about side effects. You may report side effects to FDA at 1-800-FDA-1088. Where should I keep my medication? This medication is given in a hospital or clinic. It will not be stored at home. NOTE: This sheet is a summary. It may not cover all possible information. If you have questions about this medicine, talk to your doctor, pharmacist, or health care provider.  2024 Elsevier/Gold Standard (2021-08-02 00:00:00)   Irinotecan Injection What is this medication? IRINOTECAN (ir in oh TEE kan) treats some types of cancer. It works by slowing down the growth of cancer cells. This medicine may be used for other purposes; ask your health care provider or pharmacist if you have questions. COMMON BRAND NAME(S): Camptosar What should I tell my care team before I take this medication? They need to know if you have any of these conditions: Dehydration Diarrhea Infection, especially a viral infection, such as chickenpox, cold sores, herpes Liver disease Low blood cell levels (white cells, red cells, and platelets) Low levels of electrolytes, such as calcium, magnesium, or potassium in your blood Recent or ongoing radiation An unusual or allergic reaction to irinotecan, other medications, foods, dyes, or preservatives If you or your partner are pregnant or trying to get pregnant Breast-feeding How should I use this medication? This medication is injected into a vein. It is given by your  care team in a hospital or clinic setting. Talk to your care team about the use of this medication in children. Special care may be needed. Overdosage: If you think you have taken too much of this medicine contact a poison control center or emergency room at once. NOTE: This medicine is only for you. Do not share this medicine with others. What if I miss a dose? Keep appointments for follow-up doses. It is important not to miss your dose. Call your care team if you are unable to keep an appointment. What may interact with this medication? Do not take this medication with any of the following: Cobicistat Itraconazole This medication may also interact with the following: Certain antibiotics, such as clarithromycin, rifampin, rifabutin Certain antivirals for HIV or AIDS Certain medications for fungal infections, such as ketoconazole, posaconazole, voriconazole Certain medications for seizures, such as carbamazepine, phenobarbital, phenytoin Gemfibrozil Nefazodone St. John's wort This list may not describe all possible interactions. Give your health care provider a list of all the medicines, herbs, non-prescription drugs, or dietary supplements you use. Also tell them if you smoke, drink alcohol, or use illegal drugs. Some items may interact with your medicine. What should I  watch for while using this medication? Your condition will be monitored carefully while you are receiving this medication. You may need blood work while taking this medication. This medication may make you feel generally unwell. This is not uncommon as chemotherapy can affect healthy cells as well as cancer cells. Report any side effects. Continue your course of treatment even though you feel ill unless your care team tells you to stop. This medication can cause serious side effects. To reduce the risk, your care team may give you other medications to take before receiving this one. Be sure to follow the directions from your  care team. This medication may affect your coordination, reaction time, or judgement. Do not drive or operate machinery until you know how this medication affects you. Sit up or stand slowly to reduce the risk of dizzy or fainting spells. Drinking alcohol with this medication can increase the risk of these side effects. This medication may increase your risk of getting an infection. Call your care team for advice if you get a fever, chills, sore throat, or other symptoms of a cold or flu. Do not treat yourself. Try to avoid being around people who are sick. Avoid taking medications that contain aspirin, acetaminophen, ibuprofen, naproxen, or ketoprofen unless instructed by your care team. These medications may hide a fever. This medication may increase your risk to bruise or bleed. Call your care team if you notice any unusual bleeding. Be careful brushing or flossing your teeth or using a toothpick because you may get an infection or bleed more easily. If you have any dental work done, tell your dentist you are receiving this medication. Talk to your care team if you or your partner are pregnant or think either of you might be pregnant. This medication can cause serious birth defects if taken during pregnancy and for 6 months after the last dose. You will need a negative pregnancy test before starting this medication. Contraception is recommended while taking this medication and for 6 months after the last dose. Your care team can help you find the option that works for you. Do not father a child while taking this medication and for 3 months after the last dose. Use a condom for contraception during this time period. Do not breastfeed while taking this medication and for 7 days after the last dose. This medication may cause infertility. Talk to your care team if you are concerned about your fertility. What side effects may I notice from receiving this medication? Side effects that you should report to  your care team as soon as possible: Allergic reactions--skin rash, itching, hives, swelling of the face, lips, tongue, or throat Dry cough, shortness of breath or trouble breathing Increased saliva or tears, increased sweating, stomach cramping, diarrhea, small pupils, unusual weakness or fatigue, slow heartbeat Infection--fever, chills, cough, sore throat, wounds that don't heal, pain or trouble when passing urine, general feeling of discomfort or being unwell Kidney injury--decrease in the amount of urine, swelling of the ankles, hands, or feet Low red blood cell level--unusual weakness or fatigue, dizziness, headache, trouble breathing Severe or prolonged diarrhea Unusual bruising or bleeding Side effects that usually do not require medical attention (report to your care team if they continue or are bothersome): Constipation Diarrhea Hair loss Loss of appetite Nausea Stomach pain This list may not describe all possible side effects. Call your doctor for medical advice about side effects. You may report side effects to FDA at 1-800-FDA-1088. Where should I keep my  medication? This medication is given in a hospital or clinic. It will not be stored at home. NOTE: This sheet is a summary. It may not cover all possible information. If you have questions about this medicine, talk to your doctor, pharmacist, or health care provider.  2024 Elsevier/Gold Standard (2021-07-29 00:00:00)  Leucovorin Injection What is this medication? LEUCOVORIN (loo koe VOR in) prevents side effects from certain medications, such as methotrexate. It works by increasing folate levels. This helps protect healthy cells in your body. It may also be used to treat anemia caused by low levels of folate. It can also be used with fluorouracil, a type of chemotherapy, to treat colorectal cancer. It works by increasing the effects of fluorouracil in the body. This medicine may be used for other purposes; ask your health care  provider or pharmacist if you have questions. What should I tell my care team before I take this medication? They need to know if you have any of these conditions: Anemia from low levels of vitamin B12 in the blood An unusual or allergic reaction to leucovorin, folic acid, other medications, foods, dyes, or preservatives Pregnant or trying to get pregnant Breastfeeding How should I use this medication? This medication is injected into a vein or a muscle. It is given by your care team in a hospital or clinic setting. Talk to your care team about the use of this medication in children. Special care may be needed. Overdosage: If you think you have taken too much of this medicine contact a poison control center or emergency room at once. NOTE: This medicine is only for you. Do not share this medicine with others. What if I miss a dose? Keep appointments for follow-up doses. It is important not to miss your dose. Call your care team if you are unable to keep an appointment. What may interact with this medication? Capecitabine Fluorouracil Phenobarbital Phenytoin Primidone Trimethoprim;sulfamethoxazole This list may not describe all possible interactions. Give your health care provider a list of all the medicines, herbs, non-prescription drugs, or dietary supplements you use. Also tell them if you smoke, drink alcohol, or use illegal drugs. Some items may interact with your medicine. What should I watch for while using this medication? Your condition will be monitored carefully while you are receiving this medication. This medication may increase the side effects of 5-fluorouracil. Tell your care team if you have diarrhea or mouth sores that do not get better or that get worse. What side effects may I notice from receiving this medication? Side effects that you should report to your care team as soon as possible: Allergic reactions--skin rash, itching, hives, swelling of the face, lips, tongue,  or throat This list may not describe all possible side effects. Call your doctor for medical advice about side effects. You may report side effects to FDA at 1-800-FDA-1088. Where should I keep my medication? This medication is given in a hospital or clinic. It will not be stored at home. NOTE: This sheet is a summary. It may not cover all possible information. If you have questions about this medicine, talk to your doctor, pharmacist, or health care provider.  2024 Elsevier/Gold Standard (2021-08-20 00:00:00)   Fluorouracil Injection What is this medication? FLUOROURACIL (flure oh YOOR a sil) treats some types of cancer. It works by slowing down the growth of cancer cells. This medicine may be used for other purposes; ask your health care provider or pharmacist if you have questions. COMMON BRAND NAME(S): Adrucil What should  I tell my care team before I take this medication? They need to know if you have any of these conditions: Blood disorders Dihydropyrimidine dehydrogenase (DPD) deficiency Infection, such as chickenpox, cold sores, herpes Kidney disease Liver disease Poor nutrition Recent or ongoing radiation therapy An unusual or allergic reaction to fluorouracil, other medications, foods, dyes, or preservatives If you or your partner are pregnant or trying to get pregnant Breast-feeding How should I use this medication? This medication is injected into a vein. It is administered by your care team in a hospital or clinic setting. Talk to your care team about the use of this medication in children. Special care may be needed. Overdosage: If you think you have taken too much of this medicine contact a poison control center or emergency room at once. NOTE: This medicine is only for you. Do not share this medicine with others. What if I miss a dose? Keep appointments for follow-up doses. It is important not to miss your dose. Call your care team if you are unable to keep an  appointment. What may interact with this medication? Do not take this medication with any of the following: Live virus vaccines This medication may also interact with the following: Medications that treat or prevent blood clots, such as warfarin, enoxaparin, dalteparin This list may not describe all possible interactions. Give your health care provider a list of all the medicines, herbs, non-prescription drugs, or dietary supplements you use. Also tell them if you smoke, drink alcohol, or use illegal drugs. Some items may interact with your medicine. What should I watch for while using this medication? Your condition will be monitored carefully while you are receiving this medication. This medication may make you feel generally unwell. This is not uncommon as chemotherapy can affect healthy cells as well as cancer cells. Report any side effects. Continue your course of treatment even though you feel ill unless your care team tells you to stop. In some cases, you may be given additional medications to help with side effects. Follow all directions for their use. This medication may increase your risk of getting an infection. Call your care team for advice if you get a fever, chills, sore throat, or other symptoms of a cold or flu. Do not treat yourself. Try to avoid being around people who are sick. This medication may increase your risk to bruise or bleed. Call your care team if you notice any unusual bleeding. Be careful brushing or flossing your teeth or using a toothpick because you may get an infection or bleed more easily. If you have any dental work done, tell your dentist you are receiving this medication. Avoid taking medications that contain aspirin, acetaminophen, ibuprofen, naproxen, or ketoprofen unless instructed by your care team. These medications may hide a fever. Do not treat diarrhea with over the counter products. Contact your care team if you have diarrhea that lasts more than 2  days or if it is severe and watery. This medication can make you more sensitive to the sun. Keep out of the sun. If you cannot avoid being in the sun, wear protective clothing and sunscreen. Do not use sun lamps, tanning beds, or tanning booths. Talk to your care team if you or your partner wish to become pregnant or think you might be pregnant. This medication can cause serious birth defects if taken during pregnancy and for 3 months after the last dose. A reliable form of contraception is recommended while taking this medication and for 3  months after the last dose. Talk to your care team about effective forms of contraception. Do not father a child while taking this medication and for 3 months after the last dose. Use a condom while having sex during this time period. Do not breastfeed while taking this medication. This medication may cause infertility. Talk to your care team if you are concerned about your fertility. What side effects may I notice from receiving this medication? Side effects that you should report to your care team as soon as possible: Allergic reactions--skin rash, itching, hives, swelling of the face, lips, tongue, or throat Heart attack--pain or tightness in the chest, shoulders, arms, or jaw, nausea, shortness of breath, cold or clammy skin, feeling faint or lightheaded Heart failure--shortness of breath, swelling of the ankles, feet, or hands, sudden weight gain, unusual weakness or fatigue Heart rhythm changes--fast or irregular heartbeat, dizziness, feeling faint or lightheaded, chest pain, trouble breathing High ammonia level--unusual weakness or fatigue, confusion, loss of appetite, nausea, vomiting, seizures Infection--fever, chills, cough, sore throat, wounds that don't heal, pain or trouble when passing urine, general feeling of discomfort or being unwell Low red blood cell level--unusual weakness or fatigue, dizziness, headache, trouble breathing Pain, tingling, or  numbness in the hands or feet, muscle weakness, change in vision, confusion or trouble speaking, loss of balance or coordination, trouble walking, seizures Redness, swelling, and blistering of the skin over hands and feet Severe or prolonged diarrhea Unusual bruising or bleeding Side effects that usually do not require medical attention (report to your care team if they continue or are bothersome): Dry skin Headache Increased tears Nausea Pain, redness, or swelling with sores inside the mouth or throat Sensitivity to light Vomiting This list may not describe all possible side effects. Call your doctor for medical advice about side effects. You may report side effects to FDA at 1-800-FDA-1088. Where should I keep my medication? This medication is given in a hospital or clinic. It will not be stored at home. NOTE: This sheet is a summary. It may not cover all possible information. If you have questions about this medicine, talk to your doctor, pharmacist, or health care provider.  2024 Elsevier/Gold Standard (2021-07-23 00:00:00)   The chemotherapy medication bag should finish at 46 hours, 96 hours, or 7 days. For example, if your pump is scheduled for 46 hours and it was put on at 4:00 p.m., it should finish at 2:00 p.m. the day it is scheduled to come off regardless of your appointment time.     Estimated time to finish at 11:00 a.m. on Friday 01/09/2023.   If the display on your pump reads "Low Volume" and it is beeping, take the batteries out of the pump and come to the cancer center for it to be taken off.   If the pump alarms go off prior to the pump reading "Low Volume" then call [phone] and someone can assist you.  If the plunger comes out and the chemotherapy medication is leaking out, please use your home chemo spill kit to clean up the spill. Do NOT use paper towels or other household products.  If you have problems or questions regarding your pump, please call either  [phone] (24 hours a day) or the cancer center Monday-Friday 8:00 a.m.- 4:30 p.m. at the clinic number and we will assist you. If you are unable to get assistance, then go to the nearest Emergency Department and ask the staff to contact the IV team for assistance.

## 2023-01-07 NOTE — Progress Notes (Signed)
Patient seen by Lonna Cobb NP today  Vitals are within treatment parameters:Yes   Labs are within treatment parameters: Yes   Treatment plan has been signed: Yes   Per physician team, Patient is ready for treatment and there are NO modifications to the treatment plan.

## 2023-01-09 ENCOUNTER — Inpatient Hospital Stay: Payer: BC Managed Care – PPO

## 2023-01-09 VITALS — BP 137/91 | HR 74 | Temp 97.5°F | Resp 18

## 2023-01-09 DIAGNOSIS — C787 Secondary malignant neoplasm of liver and intrahepatic bile duct: Secondary | ICD-10-CM | POA: Diagnosis not present

## 2023-01-09 DIAGNOSIS — I480 Paroxysmal atrial fibrillation: Secondary | ICD-10-CM | POA: Diagnosis not present

## 2023-01-09 DIAGNOSIS — Z9889 Other specified postprocedural states: Secondary | ICD-10-CM | POA: Diagnosis not present

## 2023-01-09 DIAGNOSIS — R0609 Other forms of dyspnea: Secondary | ICD-10-CM | POA: Diagnosis not present

## 2023-01-09 DIAGNOSIS — Z5112 Encounter for antineoplastic immunotherapy: Secondary | ICD-10-CM | POA: Diagnosis not present

## 2023-01-09 DIAGNOSIS — Z5189 Encounter for other specified aftercare: Secondary | ICD-10-CM | POA: Diagnosis not present

## 2023-01-09 DIAGNOSIS — C186 Malignant neoplasm of descending colon: Secondary | ICD-10-CM | POA: Diagnosis not present

## 2023-01-09 DIAGNOSIS — Z5111 Encounter for antineoplastic chemotherapy: Secondary | ICD-10-CM | POA: Diagnosis not present

## 2023-01-09 DIAGNOSIS — I10 Essential (primary) hypertension: Secondary | ICD-10-CM | POA: Diagnosis not present

## 2023-01-09 MED ORDER — SODIUM CHLORIDE 0.9% FLUSH
10.0000 mL | INTRAVENOUS | Status: DC | PRN
  Administered 2023-01-09: 10 mL

## 2023-01-09 MED ORDER — PEGFILGRASTIM-CBQV 6 MG/0.6ML ~~LOC~~ SOSY
6.0000 mg | PREFILLED_SYRINGE | Freq: Once | SUBCUTANEOUS | Status: AC
  Administered 2023-01-09: 6 mg via SUBCUTANEOUS
  Filled 2023-01-09: qty 0.6

## 2023-01-09 MED ORDER — HEPARIN SOD (PORK) LOCK FLUSH 100 UNIT/ML IV SOLN
500.0000 [IU] | Freq: Once | INTRAVENOUS | Status: AC | PRN
  Administered 2023-01-09: 500 [IU]

## 2023-01-15 DIAGNOSIS — C61 Malignant neoplasm of prostate: Secondary | ICD-10-CM | POA: Diagnosis not present

## 2023-01-18 ENCOUNTER — Other Ambulatory Visit: Payer: Self-pay | Admitting: Oncology

## 2023-01-18 DIAGNOSIS — C186 Malignant neoplasm of descending colon: Secondary | ICD-10-CM

## 2023-01-21 ENCOUNTER — Encounter: Payer: Self-pay | Admitting: *Deleted

## 2023-01-21 ENCOUNTER — Inpatient Hospital Stay: Payer: BC Managed Care – PPO

## 2023-01-21 ENCOUNTER — Encounter: Payer: Self-pay | Admitting: Oncology

## 2023-01-21 ENCOUNTER — Inpatient Hospital Stay: Payer: BC Managed Care – PPO | Admitting: Oncology

## 2023-01-21 DIAGNOSIS — I480 Paroxysmal atrial fibrillation: Secondary | ICD-10-CM | POA: Diagnosis not present

## 2023-01-21 DIAGNOSIS — C186 Malignant neoplasm of descending colon: Secondary | ICD-10-CM

## 2023-01-21 DIAGNOSIS — Z9889 Other specified postprocedural states: Secondary | ICD-10-CM | POA: Diagnosis not present

## 2023-01-21 DIAGNOSIS — C787 Secondary malignant neoplasm of liver and intrahepatic bile duct: Secondary | ICD-10-CM | POA: Diagnosis not present

## 2023-01-21 DIAGNOSIS — Z5111 Encounter for antineoplastic chemotherapy: Secondary | ICD-10-CM | POA: Diagnosis not present

## 2023-01-21 DIAGNOSIS — Z5189 Encounter for other specified aftercare: Secondary | ICD-10-CM | POA: Diagnosis not present

## 2023-01-21 DIAGNOSIS — R0609 Other forms of dyspnea: Secondary | ICD-10-CM | POA: Diagnosis not present

## 2023-01-21 DIAGNOSIS — I10 Essential (primary) hypertension: Secondary | ICD-10-CM | POA: Diagnosis not present

## 2023-01-21 DIAGNOSIS — Z5112 Encounter for antineoplastic immunotherapy: Secondary | ICD-10-CM | POA: Diagnosis not present

## 2023-01-21 LAB — CMP (CANCER CENTER ONLY)
ALT: 13 U/L (ref 0–44)
AST: 15 U/L (ref 15–41)
Albumin: 3.9 g/dL (ref 3.5–5.0)
Alkaline Phosphatase: 242 U/L — ABNORMAL HIGH (ref 38–126)
Anion gap: 9 (ref 5–15)
BUN: 19 mg/dL (ref 6–20)
CO2: 25 mmol/L (ref 22–32)
Calcium: 9.1 mg/dL (ref 8.9–10.3)
Chloride: 98 mmol/L (ref 98–111)
Creatinine: 1.16 mg/dL (ref 0.61–1.24)
GFR, Estimated: >60 mL/min (ref >60)
Glucose, Bld: 234 mg/dL — ABNORMAL HIGH (ref 70–99)
Potassium: 4.6 mmol/L (ref 3.5–5.1)
Sodium: 132 mmol/L — ABNORMAL LOW (ref 135–145)
Total Bilirubin: 0.8 mg/dL (ref 0.3–1.2)
Total Protein: 7 g/dL (ref 6.5–8.1)

## 2023-01-21 LAB — CBC WITH DIFFERENTIAL (CANCER CENTER ONLY)
Abs Immature Granulocytes: 1.21 10*3/uL — ABNORMAL HIGH (ref 0.00–0.07)
Basophils Absolute: 0.1 10*3/uL (ref 0.0–0.1)
Basophils Relative: 1 %
Eosinophils Absolute: 0.5 10*3/uL (ref 0.0–0.5)
Eosinophils Relative: 3 %
HCT: 37.9 % — ABNORMAL LOW (ref 39.0–52.0)
Hemoglobin: 12.7 g/dL — ABNORMAL LOW (ref 13.0–17.0)
Immature Granulocytes: 8 %
Lymphocytes Relative: 8 %
Lymphs Abs: 1.2 10*3/uL (ref 0.7–4.0)
MCH: 33.9 pg (ref 26.0–34.0)
MCHC: 33.5 g/dL (ref 30.0–36.0)
MCV: 101.1 fL — ABNORMAL HIGH (ref 80.0–100.0)
Monocytes Absolute: 1.7 10*3/uL — ABNORMAL HIGH (ref 0.1–1.0)
Monocytes Relative: 11 %
Neutro Abs: 10.8 10*3/uL — ABNORMAL HIGH (ref 1.7–7.7)
Neutrophils Relative %: 69 %
Platelet Count: 150 10*3/uL (ref 150–400)
RBC: 3.75 MIL/uL — ABNORMAL LOW (ref 4.22–5.81)
RDW: 15.2 % (ref 11.5–15.5)
WBC Count: 15.3 10*3/uL — ABNORMAL HIGH (ref 4.0–10.5)
nRBC: 0.1 % (ref 0.0–0.2)

## 2023-01-21 LAB — CEA (ACCESS): CEA (CHCC): 9.34 ng/mL — ABNORMAL HIGH (ref 0.00–5.00)

## 2023-01-21 LAB — TOTAL PROTEIN, URINE DIPSTICK: Protein, ur: 100 mg/dL — AB

## 2023-01-21 MED ORDER — ATROPINE SULFATE 1 MG/ML IV SOLN
0.5000 mg | Freq: Once | INTRAVENOUS | Status: AC | PRN
  Administered 2023-01-21: 0.5 mg via INTRAVENOUS
  Filled 2023-01-21: qty 1

## 2023-01-21 MED ORDER — PALONOSETRON HCL INJECTION 0.25 MG/5ML
0.2500 mg | Freq: Once | INTRAVENOUS | Status: AC
  Administered 2023-01-21: 0.25 mg via INTRAVENOUS
  Filled 2023-01-21: qty 5

## 2023-01-21 MED ORDER — SODIUM CHLORIDE 0.9 % IV SOLN
400.0000 mg/m2 | Freq: Once | INTRAVENOUS | Status: AC
  Administered 2023-01-21: 872 mg via INTRAVENOUS
  Filled 2023-01-21: qty 43.6

## 2023-01-21 MED ORDER — FLUOROURACIL CHEMO INJECTION 2.5 GM/50ML
400.0000 mg/m2 | Freq: Once | INTRAVENOUS | Status: AC
Start: 2023-01-21 — End: 2023-01-21
  Administered 2023-01-21: 850 mg via INTRAVENOUS
  Filled 2023-01-21: qty 17

## 2023-01-21 MED ORDER — SODIUM CHLORIDE 0.9 % IV SOLN
140.0000 mg/m2 | Freq: Once | INTRAVENOUS | Status: AC
  Administered 2023-01-21: 300 mg via INTRAVENOUS
  Filled 2023-01-21: qty 15

## 2023-01-21 MED ORDER — SODIUM CHLORIDE 0.9 % IV SOLN: Freq: Once | INTRAVENOUS | Status: AC

## 2023-01-21 MED ORDER — SODIUM CHLORIDE 0.9 % IV SOLN
2400.0000 mg/m2 | INTRAVENOUS | Status: DC
  Administered 2023-01-21: 5000 mg via INTRAVENOUS
  Filled 2023-01-21: qty 100

## 2023-01-21 MED ORDER — DEXAMETHASONE SODIUM PHOSPHATE 10 MG/ML IJ SOLN
10.0000 mg | Freq: Once | INTRAMUSCULAR | Status: AC
  Administered 2023-01-21: 10 mg via INTRAVENOUS
  Filled 2023-01-21: qty 1

## 2023-01-21 NOTE — Progress Notes (Signed)
Patient seen by Dr. Thornton Papas today  Vitals are within treatment parameters:Yes   Labs are within treatment parameters: Yes   Treatment plan has been signed: Yes   Per physician team, Patient is ready for treatment. Please note the following modifications:Avastin being held this cycle. Also, not getting Zometa at this time.

## 2023-01-21 NOTE — Progress Notes (Signed)
[facility] OFFICE PROGRESS NOTE   Diagnosis: Colon cancer  INTERVAL HISTORY:   [name] completed another cycle of FOLFIRI/bevacizumab on 01/07/2023.  No nausea/vomiting.  He has developed increased pain at the left buttocks when sitting.  The pain radiates into the upper left leg.  The pain is relieved with ibuprofen and tramadol.  The left gum lesion has resolved.  He reports malaise. He has noted increased drainage from the suprapubic cyst.  Objective:  Vital signs in last 24 hours:  Blood pressure 125/82, pulse 93, temperature 98.2 F (36.8 C), resp. rate 18, height 6' (1.829 m), weight 206 lb 11.2 oz (93.8 kg), SpO2 100%.    HEENT: No thrush or ulcers Resp: Lungs clear bilaterally Cardio: Regular rate and rhythm GI: No hepatosplenomegaly, no mass, nontender Vascular: No leg edema Neuro: Foot strength is intact with dorsi flexion bilaterally Skin: 2-3 cm area of induration with bloody drainage at the left suprapubic area Musculoskeletal: No tenderness at the sacrum or ischium  Portacath/PICC-without erythema  Lab Results:  Lab Results  Component Value Date   WBC 15.3 (H) 01/21/2023   HGB 12.7 (L) 01/21/2023   HCT 37.9 (L) 01/21/2023   MCV 101.1 (H) 01/21/2023   PLT 150 01/21/2023   NEUTROABS 10.8 (H) 01/21/2023    CMP  Lab Results  Component Value Date   NA 132 (L) 01/21/2023   K 4.6 01/21/2023   CL 98 01/21/2023   CO2 25 01/21/2023   GLUCOSE 234 (H) 01/21/2023   BUN 19 01/21/2023   CREATININE 1.16 01/21/2023   CALCIUM 9.1 01/21/2023   PROT 7.0 01/21/2023   ALBUMIN 3.9 01/21/2023   AST 15 01/21/2023   ALT 13 01/21/2023   ALKPHOS 242 (H) 01/21/2023   BILITOT 0.8 01/21/2023   GFRNONAA >60 01/21/2023   GFRAA 115 02/08/2020    Lab Results  Component Value Date   CEA1 2.00 07/13/2020   CEA 9.34 (H) 01/21/2023    Lab Results  Component Value Date   INR 1.1 08/13/2021   LABPROT 14.2 08/13/2021    Imaging:  No results  found.  Medications: I have reviewed the patient's current medications.   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, status post a left colectomy 03/04/2019 Mass felt to be in the transverse colon on a colonoscopy 01/07/2019 with a biopsy confirming poorly differentiated adenocarcinoma with signet ring cell features, mismatch repair protein expression normal CT abdomen/pelvis 01/08/2019 12.3 cm indeterminate splenic mass, present in 2015 on a lumbar MRI-felt to be benign, small subpleural and pleural-based nodules at the lung bases, status post left nephrectomy CT chest 01/20/2019-small bilateral pulmonary nodules with rounded parenchymal nodules in the right lower lobe, indeterminate splenic mass CT chest 03/18/2019-multiple subcentimeter pulmonary nodules again seen bilaterally, greatest in the lower lobes, without significant change.  No new or enlarging pulmonary nodules or masses.  Partially visualized large heterogeneous splenic mass with no significant change.  Plan for follow-up chest CT at a 50-month interval. Cycle 1 FOLFOX 03/21/2019 Cycle 2 FOLFOX 04/04/2019 Cycle 3 FOLFOX 04/18/2019 (oxaliplatin dose reduced secondary to thrombocytopenia) Cycle 4 FOLFOX 05/02/2019 (oxaliplatin held secondary to thrombocytopenia) Cycle 5 FOLFOX 05/16/2019 Cycle 6 FOLFOX 05/30/2019 (oxaliplatin held secondary to thrombocytopenia) CT chest 06/08/2019-stable scattered small solid pulmonary nodules  Cycle 7 FOLFOX 06/13/2019 Cycle 8 FOLFOX 06/27/2019 Cycle 9 FOLFOX 07/11/2019 Cycle 10 FOLFOX 07/25/2019 (oxaliplatin held due to thrombocytopenia) Cycle 11 FOLFOX 08/08/2019 Cycle 12 FOLFOX  08/22/2019 CTs 01/09/2020-no evidence of recurrent disease, stable lung nodules favored to represent subpleural lymph nodes-dedicated follow-up not recommended CTs 01/11/2021-no evidence of recurrent disease CTs  01/24/2022-no evidence of recurrent disease CT lumbar spine, abdomen/pelvis 04/11/2022-numerous mixed lytic and sclerotic lesions scattered throughout the visualized axial and appendicular skeleton including several areas in the lumbar spine.   04/21/2022 CT biopsy sclerotic lesion right anterior iliac bone-consistent with metastatic colorectal adenocarcinoma, moderate to poorly differentiated.  Foundation 1-microsatellite stable, tumor mutation burden 4, K-ras G12S PET scan 05/09/2022-widespread hypermetabolic mixed faintly lytic and sclerotic osseous metastases throughout the axial and proximal appendicular skeleton; associated mild pathologic compression fracture at L3; no hypermetabolic extraosseous metastatic disease. Cycle 1 FOLFIRI/bevacizumab 05/26/2022 Cycle 2 FOLFIRI/bevacizumab 06/09/2022 Cycle 3 FOLFIRI 06/24/2022, bevacizumab 06/26/2022 Cycle 4 FOLFIRI 07/08/2022, bevacizumab held due to proteinuria Cycle 5 FOLFIRI 07/22/2022, bevacizumab held due to proteinuria CTs 07/31/2022-potentially areas of sclerosis represent interval healing; new lytic lesion at T12, multiple new areas of punched-out sclerosis throughout the visualized osseous structures.  Index lesion in the left iliac wing measures 1.3 x 2.8 cm.  Healing of previously metabolically occult metastatic disease on 05/09/2022 is a possibility. Cycle 6 FOLFIRI 08/04/2022, irinotecan dose reduced due to thrombocytopenia, bevacizumab held pending 24-hour urine result Cycle 7 FOLFIRI 08/18/2022, bevacizumab held secondary to proteinuria, G-CSF held Cycle 8 FOLFIRI 09/01/2022, bevacizumab held, G-CSF Cycle 9 FOLFIRI 09/16/2022, bevacizumab held, G-CSF CTs 09/29/2022-enlargement of a hypodense lesion posterior left lobe of the liver, similar diffuse mixed lytic and sclerotic osseous metastatic disease, multiple new small nodules medial right lung base, multiple other small bilateral pulmonary nodules unchanged. Cycle 10 FOLFIRI plus Avastin 10/08/2022 Cycle 11  FOLFIRI 10/27/2022, Avastin held Cycle 12 FOLFIRI/Avastin 11/11/2022 Cycle 13 FOLFIRI/Avastin 11/25/2022 CTs 12/08/2022-stable lesion posterior left lobe of the liver.  Unchanged diffuse lytic and sclerotic osseous metastatic disease.  Stable small solid pulmonary nodules.  Stable splenic lesions.  Slightly increased bladder wall thickening. Cycle 14 FOLFIRI/Avastin 12/10/2022 Cycle 15 FOLFIRI/Avastin 12/24/2022 12/31/2022 24-hour urine protein 710 mg Cycle 16 FOLFIRI/Avastin 01/07/2023 Cycle 17 FOLFIRI 01/21/2023, Avastin held secondary to persistent proteinuria, slight increase in creatinine, and drainage from the suprapubic lesion   Multiple colon polyps-ascending colon polyps on the colonoscopy 01/07/2019-not removed Colonoscopy 01/03/2020-multiple polyps removed, tubular adenomas, inflammatory and hyperplastic polyps Wilms tumor at age 61, 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  Left nephrectomy at age 22 12.  Port-A-Cath placement, Dr. Michaell Cowing, 03/16/2019 13.  Thrombocytopenia secondary to chemotherapy-oxaliplatin dose reduced beginning with cycle 3 FOLFOX, improved 14.  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  Right groin soft tissue infection May 2023-status postsurgical debridement 18.  Left leg DVT-common femoral, femoral, popliteal, posterior tibial, and peroneal veins 19.  02/04/2022-MRI cervical spine-1.7 cm T1 lesion-indeterminate; bone scan 03/14/2022-focal intense activity at approximate T1 level felt to correspond to the lesion on MRI, additional foci of  activity involving the lower thoracic and lumbar spine, right iliac bone and pubic symphysis.  CT lumbar spine, abdomen/pelvis 04/11/2022-numerous mixed lytic and sclerotic lesions scattered throughout the visualized axial and appendicular skeleton including several areas in the lumbar spine.  04/21/2022 CT biopsy sclerotic lesion right anterior iliac bone-consistent with metastatic  colorectal adenocarcinoma, moderate to poorly differentiated 20.  Inflamed sebaceous cyst 08/18/2022-doxycycline    Disposition: Mr. Cirrincione has metastatic colon cancer.  He appears to be tolerating FOLFIRI well.  He will complete another cycle today.  I will hold the bevacizumab secondary to the persistent proteinuria, slight increase in the creatinine, and increased bloody drainage from the cystic suprapubic lesion.  We reviewed the recent CT images.  He appears to have multiple metastases at the lumbar spine/sacrum and pelvis.  I suspect his pain is related to spine/pelvic metastases and potentially nerve compression.  He will continue tramadol as needed for pain.  I encouraged him to use ibuprofen sparingly with the 1 kidney. The CEA is slightly higher.  He will be scheduled for restaging CTs next week.  He will return for an office visit 02/03/2023.  I will refer him to the GI oncology team at [facility] to establish as a patient for clinical trials.  Thornton Papas, MD  01/21/2023  11:01 AM

## 2023-01-21 NOTE — Progress Notes (Signed)
Referral faxed to [facility] - Drs Maryruth Hancock and Mettu for potential clinical trials at [phone]

## 2023-01-21 NOTE — Patient Instructions (Signed)

## 2023-01-22 ENCOUNTER — Encounter: Payer: Self-pay | Admitting: *Deleted

## 2023-01-22 DIAGNOSIS — C61 Malignant neoplasm of prostate: Secondary | ICD-10-CM | POA: Diagnosis not present

## 2023-01-23 ENCOUNTER — Inpatient Hospital Stay: Payer: BC Managed Care – PPO

## 2023-01-23 VITALS — BP 131/80 | HR 89 | Temp 98.0°F | Resp 18

## 2023-01-23 DIAGNOSIS — R0609 Other forms of dyspnea: Secondary | ICD-10-CM | POA: Diagnosis not present

## 2023-01-23 DIAGNOSIS — Z5111 Encounter for antineoplastic chemotherapy: Secondary | ICD-10-CM | POA: Diagnosis not present

## 2023-01-23 DIAGNOSIS — Z5112 Encounter for antineoplastic immunotherapy: Secondary | ICD-10-CM | POA: Diagnosis not present

## 2023-01-23 DIAGNOSIS — I480 Paroxysmal atrial fibrillation: Secondary | ICD-10-CM | POA: Diagnosis not present

## 2023-01-23 DIAGNOSIS — Z5189 Encounter for other specified aftercare: Secondary | ICD-10-CM | POA: Diagnosis not present

## 2023-01-23 DIAGNOSIS — C787 Secondary malignant neoplasm of liver and intrahepatic bile duct: Secondary | ICD-10-CM | POA: Diagnosis not present

## 2023-01-23 DIAGNOSIS — C186 Malignant neoplasm of descending colon: Secondary | ICD-10-CM

## 2023-01-23 DIAGNOSIS — Z9889 Other specified postprocedural states: Secondary | ICD-10-CM | POA: Diagnosis not present

## 2023-01-23 DIAGNOSIS — I10 Essential (primary) hypertension: Secondary | ICD-10-CM | POA: Diagnosis not present

## 2023-01-23 MED ORDER — HEPARIN SOD (PORK) LOCK FLUSH 100 UNIT/ML IV SOLN
500.0000 [IU] | Freq: Once | INTRAVENOUS | Status: AC | PRN
  Administered 2023-01-23: 500 [IU]

## 2023-01-23 MED ORDER — PEGFILGRASTIM-CBQV 6 MG/0.6ML ~~LOC~~ SOSY
6.0000 mg | PREFILLED_SYRINGE | Freq: Once | SUBCUTANEOUS | Status: AC
Start: 2023-01-23 — End: 2023-01-23
  Administered 2023-01-23: 6 mg via SUBCUTANEOUS
  Filled 2023-01-23: qty 0.6

## 2023-01-23 MED ORDER — SODIUM CHLORIDE 0.9% FLUSH
10.0000 mL | INTRAVENOUS | Status: DC | PRN
  Administered 2023-01-23: 10 mL

## 2023-01-23 NOTE — Patient Instructions (Signed)

## 2023-01-25 NOTE — Progress Notes (Unsigned)
Cardiology Office Note:    Date:  01/26/2023   ID:  [name], DOB 08-13-1965, MRN [id number]  PCP:  David Rua, MD  Cardiologist:  David Muss, MD  Electrophysiologist:  None   Referring MD: David Rua, MD   Chief Complaint  Patient presents with   s/p MV repair    History of Present Illness:    [name] is a 57 y.o. 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  Previously followed with Dr. Eldridge [name], last seen 03/2022.  TEE 01/2020 showed severe MR with flail posterior leaflet.  Cath showed normal coronary arteries.  He underwent mitral valve repair 05/09/2020.  Echocardiogram 03/2022 showed EF 60 to 65%, septal hypokinesis, grade 1 diastolic dysfunction, normal RV function, status post mitral valve repair with mean mitral valve gradient 7 mmHg.  Since last clinic visit, he reports that he has been having dyspnea with minimal exertion.  States that he has been very exhausted on chemotherapy.  Denies any chest pain.  Reports no bleeding on Pradaxa.   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   History of hiatal hernia    Hyperlipidemia    Hypertension    Diagnostic exercise tolerance test assessment:04/30/2010 : comments normal -no evidence os ischemia by ST analysis   lipoma     Lipoma 01/26/2019   Mitral regurgitation    Echo 12/2018: EF 60-65, myxomatous mitral valve, severe mitral regurgitation, partial flail leaflet of posterior mitral valve, myxomatous tricuspid valve with trivial TR, ascending aorta mildly dilated (39 mm)   Pneumonia    as child   PONV (postoperative nausea and vomiting)    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   Complex valvuloplasty including artificial Gore-tex neochord placement x6 with 28 mm Sorin Memo 4D ring annuloplasty via right mini thoracotomy approach   Solitary kidney    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    Past Surgical History:  Procedure Laterality Date   ABDOMINAL AORTOGRAM N/A 02/10/2020   Procedure: ABDOMINAL AORTOGRAM;  Surgeon: Corky Crafts, MD;  Location: [facility] INVASIVE CV LAB;  Service: Cardiovascular;  Laterality: N/A;   APPLICATION OF WOUND VAC  08/15/2021   Procedure: APPLICATION OF WOUND VAC;  Surgeon: Emelia Loron, MD;  Location: [facility] OR;  Service: General;;   BIOPSY  01/07/2019   Procedure: BIOPSY;  Surgeon: Charlott Rakes, MD;  Location: [facility] ENDOSCOPY;  Service: Endoscopy;;   BIOPSY  01/03/2020   Procedure: BIOPSY;  Surgeon: Charlott Rakes, MD;  Location: [facility] ENDOSCOPY;  Service: Endoscopy;;   COLON SURGERY  03/04/2019   left colon segmental resection   COLONOSCOPY WITH PROPOFOL N/A 01/07/2019   Procedure: COLONOSCOPY WITH PROPOFOL;  Surgeon: Charlott Rakes, MD;  Location: [facility] ENDOSCOPY;  Service: Endoscopy;  Laterality: N/A;   COLONOSCOPY WITH PROPOFOL N/A 01/03/2020   Procedure: COLONOSCOPY WITH PROPOFOL;  Surgeon: Charlott Rakes, MD;  Location: [facility] ENDOSCOPY;  Service: Endoscopy;  Laterality: N/A;   INCISION AND DRAINAGE OF WOUND Right 08/13/2021   Procedure: IRRIGATION AND DEBRIDEMENT WOUND RIGHT GROIN;  Surgeon: Axel Filler, MD;  Location[facility] OR;  Service: General;  Laterality: Right;   INGUINAL HERNIA REPAIR Left 04/23/2016   Procedure:  LAPAROSCOPIC  INGUINAL REPAIR;  Surgeon: Berna Bue, MD;  Location: [facility] OR;  Service: General;  Laterality: Left;   IR IMAGING GUIDED PORT INSERTION  05/08/2022   IRRIGATION AND DEBRIDEMENT ABSCESS Right 08/15/2021   Procedure: IRRIGATION AND DEBRIDEMENT OF GROIN;  Surgeon: Emelia Loron, MD;  Location: [facility] OR;  Service: General;  Laterality: Right;   KNEE SURGERY     arthroscopic left knee   LIPOMA EXCISION  2015   x20 [facility]   MITRAL VALVE REPAIR Right 05/09/2020   Procedure: MINIMALLY INVASIVE MITRAL VALVE REPAIR (MVR) USING LIVANOVA MEMO 4D MITRAL RING;  Surgeon: Purcell Nails, MD;  Location: [facility] OR;  Service: Open Heart Surgery;  Laterality: Right;   POLYPECTOMY  01/03/2020   Procedure: POLYPECTOMY;  Surgeon: Charlott Rakes, MD;  Location: [facility] ENDOSCOPY;  Service: Endoscopy;;   PORT-A-CATH REMOVAL     PORTACATH PLACEMENT N/A 03/16/2019   Procedure: INSERTION PORT-A-CATH;  Surgeon: Karie Soda, MD;  Location: [facility] ORS;  Service: General;  Laterality: N/A;   RIGHT/LEFT HEART CATH AND CORONARY ANGIOGRAPHY N/A 02/10/2020   Procedure: RIGHT/LEFT HEART CATH AND CORONARY ANGIOGRAPHY;  Surgeon: Corky Crafts, MD;  Location: [facility] INVASIVE CV LAB;  Service: Cardiovascular;  Laterality: N/A;   SUBMUCOSAL INJECTION  01/07/2019   Procedure: SUBMUCOSAL INJECTION;  Surgeon: Charlott Rakes, MD;  Location: [facility] ENDOSCOPY;  Service: Endoscopy;;   TEE WITHOUT CARDIOVERSION N/A 02/10/2020   Procedure: TRANSESOPHAGEAL ECHOCARDIOGRAM (TEE);  Surgeon: Elease Hashimoto Deloris Ping, MD;  Location: [facility] ENDOSCOPY;  Service: Cardiovascular;  Laterality: N/A;  CATH AFTER TEE   TEE WITHOUT CARDIOVERSION N/A 05/09/2020   Procedure: TRANSESOPHAGEAL ECHOCARDIOGRAM (TEE);  Surgeon: Purcell Nails, MD;  Location: [facility] OR;  Service: Open Heart Surgery;  Laterality: N/A;   THYROIDECTOMY, PARTIAL  01/05/2009   Right Thyroid Lobectomy   TONSILLECTOMY     TOTAL NEPHRECTOMY Left 1970   Wilms Tumor left kidney excised as an  infant    Current Medications: Current Meds  Medication Sig   acetaminophen (TYLENOL) 500 MG tablet Take 1 tablet (500 mg total) by mouth daily as needed for mild pain.   albuterol (PROVENTIL HFA;VENTOLIN HFA) 108 (90 BASE) MCG/ACT inhaler Inhale 1-2 puffs into the lungs every 6 (six) hours as needed for wheezing or shortness of breath.   amoxicillin (AMOXIL) 500 MG capsule    aspirin EC 81 MG EC tablet Take 1 tablet (81 mg total) by mouth daily. Swallow whole.   atorvastatin (LIPITOR) 10 MG tablet Take 1 tablet (10 mg total) by mouth daily.   calcium carbonate (TUMS EX) 750 MG chewable tablet Chew 1,500 mg by mouth daily as needed for heartburn.   cetirizine (ZYRTEC) 10 MG tablet Take 10 mg by mouth at bedtime.   dabigatran (PRADAXA) 150 MG CAPS capsule Take 150 mg by mouth 2 (two) times daily.   fluticasone (FLONASE) 50 MCG/ACT nasal spray Place 1 spray into both nostrils daily as needed for allergies or rhinitis.   ibuprofen (ADVIL) 200 MG tablet Take 200 mg by mouth  daily as needed for mild pain.   insulin glargine, 1 Unit Dial, (TOUJEO SOLOSTAR) 300 UNIT/ML Solostar Pen Inject 28 Units into the skin daily.   lidocaine-prilocaine (EMLA) cream Apply to port site approximately 1 hour prior to use   lisinopril (ZESTRIL) 10 MG tablet Take 1 tablet (10 mg total) by mouth daily.   loratadine (CLARITIN) 10 MG tablet Take 10 mg by mouth daily as needed for allergies.   metFORMIN (GLUCOPHAGE) 1000 MG tablet Take 1 tablet (1,000 mg total) by mouth 2 (two) times daily.   metoprolol succinate (TOPROL-XL) 25 MG 24 hr tablet Take 25 mg by mouth daily.   mometasone (ASMANEX) 220 MCG/INH inhaler Inhale 1 puff into the lungs daily.   Omega-3 Fatty Acids (FISH OIL PO) Take 1,000 mg by mouth 2 (two) times daily.   ONE TOUCH ULTRA TEST test strip 1 each by Other route as needed (GLUCOSE).    pioglitazone (ACTOS) 30 MG tablet Take 30 mg by mouth daily.   prochlorperazine (COMPAZINE) 10 MG tablet Take 1  tablet (10 mg total) by mouth every 6 (six) hours as needed for nausea or vomiting.   repaglinide (PRANDIN) 2 MG tablet Take 2 mg by mouth 2 (two) times daily.   traMADol (ULTRAM) 50 MG tablet Take 1 tablet (50 mg total) by mouth every 6 (six) hours as needed.     Allergies:   Empagliflozin   Social History   Socioeconomic History   Marital status: Married    Spouse name: Not on file   Number of children: Not on file   Years of education: Not on file   Highest education level: Not on file  Occupational History   Occupation: executive  Tobacco Use   Smoking status: Never   Smokeless tobacco: Never  Vaping Use   Vaping status: Never Used  Substance and Sexual Activity   Alcohol use: Yes    Alcohol/week: 1.0 standard drink of alcohol    Types: 1 Glasses of wine per week    Comment: occasional   Drug use: No   Sexual activity: Yes  Other Topics Concern   Not on file  Social History Narrative   Never smoked    Alcohol yes, rare ,1-2 per week   No recreational drugs   Occupation-  Paramedic and other executive courses   Marital status - married     Children 1 boy         Social Determinants of Corporate investment banker Strain: Not on file  Food Insecurity: Not on file  Transportation Needs: Not on file  Physical Activity: Not on file  Stress: Not on file  Social Connections: Not on file     Family History: The patient's family history includes Cancer in his mother; Colon cancer in an other family member; Colon cancer (age of onset: 69) in his maternal aunt; Colon cancer (age of onset: 63) in an other family member; Congestive Heart Failure in his maternal grandfather, maternal grandmother, and paternal uncle; Healthy in his sister; Heart attack in his maternal grandfather and paternal grandfather; Heart disease in his father; Hernia in his father; Hypotension in his mother; Stroke in his paternal grandmother.  ROS:   Please see the history of  present illness.     All other systems reviewed and are negative.  EKGs/Labs/Other Studies Reviewed:    The following studies were reviewed today:   EKG:   01/26/2023: Normal sinus rhythm, rate 88  Recent Labs: 04/18/2022: NT-Pro BNP 291 01/21/2023: ALT 13; BUN 19; Creatinine 1.16; Hemoglobin 12.7; Platelet Count 150; Potassium 4.6; Sodium 132  Recent Lipid Panel    Component Value Date/Time   CHOL 116 03/05/2014 0630   TRIG 122 03/05/2014 0630   HDL 21 (L) 03/05/2014 0630   CHOLHDL 5.5 03/05/2014 0630   VLDL 24 03/05/2014 0630   LDLCALC 71 03/05/2014 0630    Physical Exam:    VS:  BP 110/78 (BP Location: Right Arm, Patient Position: Sitting, Cuff Size: Normal)   Pulse 88   Ht 6' (1.829 m)   Wt 208 lb (94.3 kg)   SpO2 97%   BMI 28.21 kg/m     Wt Readings from Last 3 Encounters:  01/26/23 208 lb (94.3 kg)  01/21/23 206 lb 11.2 oz (93.8 kg)  01/07/23 213 lb 6.4 oz (96.8 kg)     GEN:  Well nourished, well developed in no acute distress HEENT: Normal NECK: No JVD; No carotid bruits LYMPHATICS: No lymphadenopathy CARDIAC: RRR, no murmurs, rubs, gallops RESPIRATORY:  Clear to auscultation without rales, wheezing or rhonchi  ABDOMEN: Soft, non-tender, non-distended MUSCULOSKELETAL:  No edema; No deformity  SKIN: Warm and dry NEUROLOGIC:  Alert and oriented x 3 PSYCHIATRIC:  Normal affect   ASSESSMENT:    1. DOE (dyspnea on exertion)   2. S/P MVR (mitral valve repair)   3. Essential hypertension   4. PAF (paroxysmal atrial fibrillation) (HCC)    PLAN:    Status post mitral valve repair: Flail posterior mitral valve leaflet with severe MR on TEE 01/2020.  Underwent mitral valve repair 05/2020.  Echocardiogram 03/2022 showed EF 60 to 65%, septal hypokinesis, grade 1 diastolic dysfunction, normal RV function, status post mitral valve repair with mean mitral valve gradient 7 mmHg.  DOE: Reporting dyspnea with minimal exertion.  Has been on chemotherapy for colon  cancer.  Will update echocardiogram  Atrial fibrillation: Noted after mitral valve repair.  Had been on amiodarone but was discontinued.  He monitors for recurrence with Apple Watch, has not noted any A-fib.  He is on Pradaxa for DVT  DVT: On Pradaxa  Hypertension: On lisinopril 10 mg daily, Toprol XL 25 mg daily.  Appears controlled  T2DM: On insulin.  A1c 8.6% on 10/28/2022  Hyperlipidemia: On atorvastatin 10 mg daily.  LDL 87 on 10/28/2022  RTC in 6 months  Medication Adjustments/Labs and Tests Ordered: Current medicines are reviewed at length with the patient today.  Concerns regarding medicines are outlined above.  Orders Placed This Encounter  Procedures   EKG 12-Lead   ECHOCARDIOGRAM COMPLETE   No orders of the defined types were placed in this encounter.   Patient Instructions  Medication Instructions:  Continue same medications *If you need a refill on your cardiac medications before your next appointment, please call your pharmacy*   Lab Work: None ordered   Testing/Procedures: Schedule Echo first available    Follow-Up: At [facility], you and your health needs are our priority.  As part of our continuing mission to provide you with exceptional heart care, we have created designated Provider Care Teams.  These Care Teams include your primary Cardiologist (physician) and Advanced Practice Providers (APPs -  Physician Assistants and Nurse Practitioners) who all work together to provide you with the care you need, when you need it.  We recommend signing up for the patient portal called "MyChart".  Sign up information is provided on this After Visit Summary.  MyChart is used  to connect with patients for Virtual Visits (Telemedicine).  Patients are able to view lab/test results, encounter notes, upcoming appointments, etc.  Non-urgent messages can be sent to your provider as well.   To learn more about what you can do with MyChart, go to  [email or web address].    Your next appointment:  6 months    Call in Jan to schedule April appointment     Provider:  Dr.Nariah Morgano     Signed, Little Ishikawa, MD  01/26/2023 1:11 PM     [facility]

## 2023-01-26 ENCOUNTER — Other Ambulatory Visit: Payer: Self-pay | Admitting: *Deleted

## 2023-01-26 ENCOUNTER — Inpatient Hospital Stay: Payer: BC Managed Care – PPO

## 2023-01-26 ENCOUNTER — Other Ambulatory Visit (HOSPITAL_BASED_OUTPATIENT_CLINIC_OR_DEPARTMENT_OTHER): Payer: Self-pay

## 2023-01-26 ENCOUNTER — Encounter: Payer: Self-pay | Admitting: Cardiology

## 2023-01-26 ENCOUNTER — Ambulatory Visit (HOSPITAL_BASED_OUTPATIENT_CLINIC_OR_DEPARTMENT_OTHER)
Admission: RE | Admit: 2023-01-26 | Discharge: 2023-01-26 | Disposition: A | Discharge status: Home / Self Care | Payer: BC Managed Care – PPO | Source: Ambulatory Visit | Attending: Oncology | Admitting: Oncology

## 2023-01-26 ENCOUNTER — Ambulatory Visit: Payer: BC Managed Care – PPO | Admitting: Cardiology

## 2023-01-26 VITALS — BP 110/78 | HR 88 | Ht 72.0 in | Wt 208.0 lb

## 2023-01-26 DIAGNOSIS — I480 Paroxysmal atrial fibrillation: Secondary | ICD-10-CM | POA: Insufficient documentation

## 2023-01-26 DIAGNOSIS — Z5189 Encounter for other specified aftercare: Secondary | ICD-10-CM | POA: Diagnosis not present

## 2023-01-26 DIAGNOSIS — Z5111 Encounter for antineoplastic chemotherapy: Secondary | ICD-10-CM | POA: Diagnosis not present

## 2023-01-26 DIAGNOSIS — Z9889 Other specified postprocedural states: Secondary | ICD-10-CM | POA: Insufficient documentation

## 2023-01-26 DIAGNOSIS — R161 Splenomegaly, not elsewhere classified: Secondary | ICD-10-CM | POA: Diagnosis not present

## 2023-01-26 DIAGNOSIS — R0609 Other forms of dyspnea: Secondary | ICD-10-CM

## 2023-01-26 DIAGNOSIS — I700 Atherosclerosis of aorta: Secondary | ICD-10-CM | POA: Diagnosis not present

## 2023-01-26 DIAGNOSIS — I10 Essential (primary) hypertension: Secondary | ICD-10-CM | POA: Insufficient documentation

## 2023-01-26 DIAGNOSIS — C189 Malignant neoplasm of colon, unspecified: Secondary | ICD-10-CM | POA: Diagnosis not present

## 2023-01-26 DIAGNOSIS — C186 Malignant neoplasm of descending colon: Secondary | ICD-10-CM

## 2023-01-26 DIAGNOSIS — Z5112 Encounter for antineoplastic immunotherapy: Secondary | ICD-10-CM | POA: Diagnosis not present

## 2023-01-26 DIAGNOSIS — K769 Liver disease, unspecified: Secondary | ICD-10-CM | POA: Diagnosis not present

## 2023-01-26 DIAGNOSIS — C787 Secondary malignant neoplasm of liver and intrahepatic bile duct: Secondary | ICD-10-CM | POA: Diagnosis not present

## 2023-01-26 MED ORDER — HEPARIN SOD (PORK) LOCK FLUSH 100 UNIT/ML IV SOLN
500.0000 [IU] | Freq: Once | INTRAVENOUS | Status: AC
  Administered 2023-01-26: 500 [IU] via INTRAVENOUS

## 2023-01-26 MED ORDER — IOHEXOL 300 MG/ML  SOLN
100.0000 mL | Freq: Once | INTRAMUSCULAR | Status: AC | PRN
  Administered 2023-01-26: 100 mL via INTRAVENOUS

## 2023-01-26 MED ORDER — LIDOCAINE-PRILOCAINE 2.5-2.5 % EX CREA
TOPICAL_CREAM | CUTANEOUS | 3 refills | Status: DC
  Filled 2023-01-26: qty 30, 30d supply, fill #0

## 2023-01-26 NOTE — Patient Instructions (Signed)
Medication Instructions:  Continue same medications *If you need a refill on your cardiac medications before your next appointment, please call your pharmacy*   Lab Work: None ordered   Testing/Procedures: Schedule Echo first available    Follow-Up: At [facility], you and your health needs are our priority.  As part of our continuing mission to provide you with exceptional heart care, we have created designated Provider Care Teams.  These Care Teams include your primary Cardiologist (physician) and Advanced Practice Providers (APPs -  Physician Assistants and Nurse Practitioners) who all work together to provide you with the care you need, when you need it.  We recommend signing up for the patient portal called "MyChart".  Sign up information is provided on this After Visit Summary.  MyChart is used to connect with patients for Virtual Visits (Telemedicine).  Patients are able to view lab/test results, encounter notes, upcoming appointments, etc.  Non-urgent messages can be sent to your provider as well.   To learn more about what you can do with MyChart, go to [email or web address].    Your next appointment:  6 months    Call in Jan to schedule April appointment     Provider:  Dr.Schumann

## 2023-01-26 NOTE — Patient Instructions (Signed)

## 2023-01-27 ENCOUNTER — Other Ambulatory Visit: Payer: Self-pay

## 2023-01-29 DIAGNOSIS — C189 Malignant neoplasm of colon, unspecified: Secondary | ICD-10-CM | POA: Diagnosis not present

## 2023-01-29 DIAGNOSIS — D125 Benign neoplasm of sigmoid colon: Secondary | ICD-10-CM | POA: Diagnosis not present

## 2023-01-29 DIAGNOSIS — D123 Benign neoplasm of transverse colon: Secondary | ICD-10-CM | POA: Diagnosis not present

## 2023-01-29 DIAGNOSIS — C184 Malignant neoplasm of transverse colon: Secondary | ICD-10-CM | POA: Diagnosis not present

## 2023-02-03 ENCOUNTER — Inpatient Hospital Stay: Payer: BC Managed Care – PPO

## 2023-02-03 ENCOUNTER — Inpatient Hospital Stay: Payer: BC Managed Care – PPO | Attending: Oncology

## 2023-02-03 ENCOUNTER — Encounter: Payer: Self-pay | Admitting: Nurse Practitioner

## 2023-02-03 ENCOUNTER — Telehealth: Payer: Self-pay

## 2023-02-03 ENCOUNTER — Inpatient Hospital Stay: Payer: BC Managed Care – PPO | Admitting: Nurse Practitioner

## 2023-02-03 VITALS — BP 126/80 | HR 87 | Resp 18

## 2023-02-03 VITALS — BP 113/73 | HR 94 | Temp 98.1°F | Resp 18 | Ht 72.0 in | Wt 206.6 lb

## 2023-02-03 DIAGNOSIS — Z5111 Encounter for antineoplastic chemotherapy: Secondary | ICD-10-CM | POA: Diagnosis not present

## 2023-02-03 DIAGNOSIS — C7951 Secondary malignant neoplasm of bone: Secondary | ICD-10-CM | POA: Diagnosis not present

## 2023-02-03 DIAGNOSIS — Z23 Encounter for immunization: Secondary | ICD-10-CM | POA: Insufficient documentation

## 2023-02-03 DIAGNOSIS — C186 Malignant neoplasm of descending colon: Secondary | ICD-10-CM | POA: Insufficient documentation

## 2023-02-03 DIAGNOSIS — Z5189 Encounter for other specified aftercare: Secondary | ICD-10-CM | POA: Diagnosis not present

## 2023-02-03 DIAGNOSIS — R809 Proteinuria, unspecified: Secondary | ICD-10-CM | POA: Diagnosis not present

## 2023-02-03 LAB — CBC WITH DIFFERENTIAL (CANCER CENTER ONLY)
Abs Immature Granulocytes: 0.3 10*3/uL — ABNORMAL HIGH (ref 0.00–0.07)
Band Neutrophils: 6 %
Basophils Absolute: 0 10*3/uL (ref 0.0–0.1)
Basophils Relative: 0 %
Eosinophils Absolute: 0.7 10*3/uL — ABNORMAL HIGH (ref 0.0–0.5)
Eosinophils Relative: 5 %
HCT: 36.2 % — ABNORMAL LOW (ref 39.0–52.0)
Hemoglobin: 12.3 g/dL — ABNORMAL LOW (ref 13.0–17.0)
Lymphocytes Relative: 16 %
Lymphs Abs: 2.4 10*3/uL (ref 0.7–4.0)
MCH: 34.2 pg — ABNORMAL HIGH (ref 26.0–34.0)
MCHC: 34 g/dL (ref 30.0–36.0)
MCV: 100.6 fL — ABNORMAL HIGH (ref 80.0–100.0)
Metamyelocytes Relative: 2 %
Monocytes Absolute: 0.9 10*3/uL (ref 0.1–1.0)
Monocytes Relative: 6 %
Neutro Abs: 10.4 10*3/uL — ABNORMAL HIGH (ref 1.7–7.7)
Neutrophils Relative %: 65 %
Platelet Count: 140 10*3/uL — ABNORMAL LOW (ref 150–400)
RBC: 3.6 MIL/uL — ABNORMAL LOW (ref 4.22–5.81)
RDW: 15.4 % (ref 11.5–15.5)
WBC Count: 14.7 10*3/uL — ABNORMAL HIGH (ref 4.0–10.5)
nRBC: 0.3 % — ABNORMAL HIGH (ref 0.0–0.2)

## 2023-02-03 LAB — CMP (CANCER CENTER ONLY)
ALT: 12 U/L (ref 0–44)
AST: 14 U/L — ABNORMAL LOW (ref 15–41)
Albumin: 3.6 g/dL (ref 3.5–5.0)
Alkaline Phosphatase: 256 U/L — ABNORMAL HIGH (ref 38–126)
Anion gap: 10 (ref 5–15)
BUN: 15 mg/dL (ref 6–20)
CO2: 24 mmol/L (ref 22–32)
Calcium: 9.1 mg/dL (ref 8.9–10.3)
Chloride: 100 mmol/L (ref 98–111)
Creatinine: 1.05 mg/dL (ref 0.61–1.24)
GFR, Estimated: >60 mL/min (ref >60)
Glucose, Bld: 198 mg/dL — ABNORMAL HIGH (ref 70–99)
Potassium: 4.3 mmol/L (ref 3.5–5.1)
Sodium: 134 mmol/L — ABNORMAL LOW (ref 135–145)
Total Bilirubin: 0.8 mg/dL (ref <1.2)
Total Protein: 6.5 g/dL (ref 6.5–8.1)

## 2023-02-03 LAB — TOTAL PROTEIN, URINE DIPSTICK: Protein, ur: 100 mg/dL — AB

## 2023-02-03 LAB — CEA (ACCESS): CEA (CHCC): 12.69 ng/mL — ABNORMAL HIGH (ref 0.00–5.00)

## 2023-02-03 MED ORDER — SODIUM CHLORIDE 0.9 % IV SOLN
140.0000 mg/m2 | Freq: Once | INTRAVENOUS | Status: AC
  Administered 2023-02-03: 300 mg via INTRAVENOUS
  Filled 2023-02-03: qty 15

## 2023-02-03 MED ORDER — PALONOSETRON HCL INJECTION 0.25 MG/5ML
0.2500 mg | Freq: Once | INTRAVENOUS | Status: AC
  Administered 2023-02-03: 0.25 mg via INTRAVENOUS
  Filled 2023-02-03: qty 5

## 2023-02-03 MED ORDER — SODIUM CHLORIDE 0.9 % IV SOLN: Freq: Once | INTRAVENOUS | Status: AC

## 2023-02-03 MED ORDER — FLUOROURACIL CHEMO INJECTION 2.5 GM/50ML
400.0000 mg/m2 | Freq: Once | INTRAVENOUS | Status: AC
  Administered 2023-02-03: 850 mg via INTRAVENOUS
  Filled 2023-02-03: qty 17

## 2023-02-03 MED ORDER — ATROPINE SULFATE 1 MG/ML IV SOLN
0.5000 mg | Freq: Once | INTRAVENOUS | Status: AC | PRN
  Administered 2023-02-03: 0.5 mg via INTRAVENOUS
  Filled 2023-02-03: qty 1

## 2023-02-03 MED ORDER — DEXAMETHASONE SODIUM PHOSPHATE 10 MG/ML IJ SOLN
10.0000 mg | Freq: Once | INTRAMUSCULAR | Status: AC
  Administered 2023-02-03: 10 mg via INTRAVENOUS
  Filled 2023-02-03: qty 1

## 2023-02-03 MED ORDER — LEUCOVORIN CALCIUM INJECTION 350 MG
400.0000 mg/m2 | Freq: Once | INTRAMUSCULAR | Status: AC
  Administered 2023-02-03: 872 mg via INTRAVENOUS
  Filled 2023-02-03: qty 43.6

## 2023-02-03 MED ORDER — SODIUM CHLORIDE 0.9 % IV SOLN
2400.0000 mg/m2 | INTRAVENOUS | Status: DC
  Administered 2023-02-03: 5000 mg via INTRAVENOUS
  Filled 2023-02-03: qty 100

## 2023-02-03 NOTE — Progress Notes (Signed)
[name] OFFICE PROGRESS NOTE   Diagnosis: Colon cancer  INTERVAL HISTORY:   [name] returns as scheduled.  He completed another cycle of FOLFIRI 01/21/2023.  Avastin was held due to proteinuria, increasing creatinine and drainage from the suprapubic lesion.  Suprapubic lesion continues to have bloody drainage.  Overall the left back/leg pain is worse.  He thinks this is due to walking more.  The pain intermittently radiates down the leg.  No numbness or weakness.  No bowel or bladder dysfunction.  He denies nausea/vomiting.  No mouth sores.  No diarrhea.  Objective:  Vital signs in last 24 hours:  Blood pressure 113/73, pulse 94, temperature 98.1 F (36.7 C), temperature source Temporal, resp. rate 18, height 6' (1.829 m), weight 206 lb 9.6 oz (93.7 kg), SpO2 100%.    HEENT: No thrush or ulcers. Resp: Lungs clear bilaterally. Cardio: Regular rate and rhythm. GI: No hepatosplenomegaly. Vascular: No leg edema. Skin: 2 to 3 cm irregular area of induration with bloody drainage left suprapubic region. Port-A-Cath without erythema.  Lab Results:  Lab Results  Component Value Date   WBC 14.7 (H) 02/03/2023   HGB 12.3 (L) 02/03/2023   HCT 36.2 (L) 02/03/2023   MCV 100.6 (H) 02/03/2023   PLT 140 (L) 02/03/2023   NEUTROABS PENDING 02/03/2023    Imaging:  No results found.  Medications: I have reviewed the patient's current medications.  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, status post a left colectomy 03/04/2019 Mass felt to be in the transverse colon on a colonoscopy 01/07/2019 with a biopsy confirming poorly differentiated adenocarcinoma with signet ring cell features, mismatch repair protein expression normal CT abdomen/pelvis 01/08/2019 12.3 cm indeterminate splenic mass, present in 2015 on a lumbar MRI-felt to be benign, small  subpleural and pleural-based nodules at the lung bases, status post left nephrectomy CT chest 01/20/2019-small bilateral pulmonary nodules with rounded parenchymal nodules in the right lower lobe, indeterminate splenic mass CT chest 03/18/2019-multiple subcentimeter pulmonary nodules again seen bilaterally, greatest in the lower lobes, without significant change.  No new or enlarging pulmonary nodules or masses.  Partially visualized large heterogeneous splenic mass with no significant change.  Plan for follow-up chest CT at a 54-month interval. Cycle 1 FOLFOX 03/21/2019 Cycle 2 FOLFOX 04/04/2019 Cycle 3 FOLFOX 04/18/2019 (oxaliplatin dose reduced secondary to thrombocytopenia) Cycle 4 FOLFOX 05/02/2019 (oxaliplatin held secondary to thrombocytopenia) Cycle 5 FOLFOX 05/16/2019 Cycle 6 FOLFOX 05/30/2019 (oxaliplatin held secondary to thrombocytopenia) CT chest 06/08/2019-stable scattered small solid pulmonary nodules  Cycle 7 FOLFOX 06/13/2019 Cycle 8 FOLFOX 06/27/2019 Cycle 9 FOLFOX 07/11/2019 Cycle 10 FOLFOX 07/25/2019 (oxaliplatin held due to thrombocytopenia) Cycle 11 FOLFOX 08/08/2019 Cycle 12 FOLFOX 08/22/2019 CTs 01/09/2020-no evidence of recurrent disease, stable lung nodules favored to represent subpleural lymph nodes-dedicated follow-up not recommended CTs 01/11/2021-no evidence of recurrent disease CTs 01/24/2022-no evidence of recurrent disease CT lumbar spine, abdomen/pelvis 04/11/2022-numerous mixed lytic and sclerotic lesions scattered throughout the visualized axial and appendicular skeleton including several areas in the lumbar spine.   04/21/2022 CT biopsy sclerotic lesion right anterior iliac bone-consistent with metastatic colorectal adenocarcinoma, moderate to poorly differentiated.  Foundation 1-microsatellite stable, tumor mutation burden 4, K-ras G12S PET scan 05/09/2022-widespread hypermetabolic mixed faintly lytic and sclerotic osseous metastases throughout the axial and proximal  appendicular skeleton; associated mild pathologic compression fracture at L3; no hypermetabolic extraosseous metastatic disease. Cycle 1 FOLFIRI/bevacizumab 05/26/2022 Cycle 2 FOLFIRI/bevacizumab 06/09/2022 Cycle 3 FOLFIRI 06/24/2022, bevacizumab  06/26/2022 Cycle 4 FOLFIRI 07/08/2022, bevacizumab held due to proteinuria Cycle 5 FOLFIRI 07/22/2022, bevacizumab held due to proteinuria CTs 07/31/2022-potentially areas of sclerosis represent interval healing; new lytic lesion at T12, multiple new areas of punched-out sclerosis throughout the visualized osseous structures.  Index lesion in the left iliac wing measures 1.3 x 2.8 cm.  Healing of previously metabolically occult metastatic disease on 05/09/2022 is a possibility. Cycle 6 FOLFIRI 08/04/2022, irinotecan dose reduced due to thrombocytopenia, bevacizumab held pending 24-hour urine result Cycle 7 FOLFIRI 08/18/2022, bevacizumab held secondary to proteinuria, G-CSF held Cycle 8 FOLFIRI 09/01/2022, bevacizumab held, G-CSF Cycle 9 FOLFIRI 09/16/2022, bevacizumab held, G-CSF CTs 09/29/2022-enlargement of a hypodense lesion posterior left lobe of the liver, similar diffuse mixed lytic and sclerotic osseous metastatic disease, multiple new small nodules medial right lung base, multiple other small bilateral pulmonary nodules unchanged. Cycle 10 FOLFIRI plus Avastin 10/08/2022 Cycle 11 FOLFIRI 10/27/2022, Avastin held Cycle 12 FOLFIRI/Avastin 11/11/2022 Cycle 13 FOLFIRI/Avastin 11/25/2022 CTs 12/08/2022-stable lesion posterior left lobe of the liver.  Unchanged diffuse lytic and sclerotic osseous metastatic disease.  Stable small solid pulmonary nodules.  Stable splenic lesions.  Slightly increased bladder wall thickening. Cycle 14 FOLFIRI/Avastin 12/10/2022 Cycle 15 FOLFIRI/Avastin 12/24/2022 12/31/2022 24-hour urine protein 710 mg Cycle 16 FOLFIRI/Avastin 01/07/2023 Cycle 17 FOLFIRI 01/21/2023, Avastin held secondary to persistent proteinuria, slight increase in creatinine,  and drainage from the suprapubic lesion CTs 01/26/2023-unchanged widespread sclerotic osseous metastatic disease, unchanged hypodense liver lesions, occasional small bilateral lung nodules unchanged. 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  Left nephrectomy at age 5 12.  Port-A-Cath placement, Dr. Michaell Cowing, 03/16/2019 13.  Thrombocytopenia secondary to chemotherapy-oxaliplatin dose reduced beginning with cycle 3 FOLFOX, improved 14.  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  Right groin soft tissue infection May 2023-status postsurgical debridement 18.  Left leg DVT-common femoral, femoral, popliteal, posterior tibial, and peroneal veins 19.  02/04/2022-MRI cervical spine-1.7 cm T1 lesion-indeterminate; bone scan 03/14/2022-focal intense activity at approximate T1 level felt to correspond to the lesion on MRI, additional foci of activity involving the lower thoracic and lumbar spine, right iliac bone and pubic symphysis.  CT lumbar spine, abdomen/pelvis 04/11/2022-numerous mixed lytic and sclerotic lesions scattered throughout the visualized axial and appendicular skeleton including several areas in the lumbar spine.  04/21/2022 CT biopsy sclerotic lesion right anterior iliac bone-consistent with metastatic colorectal  adenocarcinoma, moderate to poorly differentiated 20.  Inflamed sebaceous cyst 08/18/2022-doxycycline  Disposition: [name] appears unchanged.  He has completed 17 cycles of FOLFIRI.  Avastin has been held intermittently, most recently held 2 weeks ago due to persistent proteinuria, slight increase in creatinine and drainage from the suprapubic lesion.  Recent CT scans show stable disease.  Results/images reviewed with Mr. Dettmann at today's visit.  He agrees with the recommendation to continue FOLFIRI, treatment today.  Avastin will remain on hold due to persistent bloody drainage from the suprapubic lesion.  We are contacting his surgeon's office for further evaluation of the  lesion.  CBC and chemistry panel reviewed.  Labs adequate to proceed as above.  He has persistent intermittent left low back/leg pain.  Continue to monitor for now.  He understands to contact the office with worsening pain, onset of numbness/tingling or bowel/bladder dysfunction.  He will return for follow-up and treatment in 2 weeks.  We are available to see him sooner if needed.  Patient seen with [name].  Lonna Cobb ANP/GNP-BC   02/03/2023  10:11 AM  This was a shared visit with Lonna Cobb.  Mr. Orrego was interviewed and examined.  We reviewed the restaging CT findings and images with him.  The CTs revealed no evidence of disease progression.  The CEA is slightly higher.  The bleeding cystic lesion at the suprapubic area is chronic and most likely benign.  We will refer him to surgery to consider an I&D and biopsy of this lesion.  He has diffuse sclerotic bone metastases.  Back pain is likely related to the bone lesions.  He will continue tramadol as needed for pain.  He is scheduled to see Dr. Johny Blamer this week.  He may be a candidate for a clinical trial when there is clear evidence of disease progression.  The plan is to continue FOLFIRI for now.  We held bevacizumab secondary to the draining lesion in the  suprapubic area.  I was present for greater than 50% of today's visit.  I performed medical decision making.  Mancel Bale, MD

## 2023-02-03 NOTE — Telephone Encounter (Signed)
Patient is schedule at [facility] surgery on 02/09/2023 at [phone]

## 2023-02-03 NOTE — Patient Instructions (Addendum)
[facility] - A DEPT OF [facility]   Discharge Instructions: Thank you for choosing [facility] to provide your oncology and hematology care.   If you have a lab appointment with the Cancer Center, please go directly to the Cancer Center and check in at the registration area.   Wear comfortable clothing and clothing appropriate for easy access to any Portacath or PICC line.   We strive to give you quality time with your provider. You may need to reschedule your appointment if you arrive late (15 or more minutes).  Arriving late affects you and other patients whose appointments are after yours.  Also, if you miss three or more appointments without notifying the office, you may be dismissed from the clinic at the provider's discretion.      For prescription refill requests, have your pharmacy contact our office and allow 72 hours for refills to be completed.    Today you received the following chemotherapy and/or immunotherapy agents Irinotecan (CAMPTOSAR), Leucovorin & Flourouracil (ADRUCIL).      To help prevent nausea and vomiting after your treatment, we encourage you to take your nausea medication as directed.  BELOW ARE SYMPTOMS THAT SHOULD BE REPORTED IMMEDIATELY: *FEVER GREATER THAN 100.4 F (38 C) OR HIGHER *CHILLS OR SWEATING *NAUSEA AND VOMITING THAT IS NOT CONTROLLED WITH YOUR NAUSEA MEDICATION *UNUSUAL SHORTNESS OF BREATH *UNUSUAL BRUISING OR BLEEDING *URINARY PROBLEMS (pain or burning when urinating, or frequent urination) *BOWEL PROBLEMS (unusual diarrhea, constipation, pain near the anus) TENDERNESS IN MOUTH AND THROAT WITH OR WITHOUT PRESENCE OF ULCERS (sore throat, sores in mouth, or a toothache) UNUSUAL RASH, SWELLING OR PAIN  UNUSUAL VAGINAL DISCHARGE OR ITCHING   Items with * indicate a potential emergency and should be followed up as soon as possible or go to the Emergency Department if any problems should  occur.  Please show the CHEMOTHERAPY ALERT CARD or IMMUNOTHERAPY ALERT CARD at check-in to the Emergency Department and triage nurse.  Should you have questions after your visit or need to cancel or reschedule your appointment, please contact [facility] - [facility]  Dept: [phone]  and follow the prompts.  Office hours are 8:00 a.m. to 4:30 p.m. Monday - Friday. Please note that voicemails left after 4:00 p.m. may not be returned until the following business day.  We are closed weekends and major holidays. You have access to a nurse at all times for urgent questions. Please call the main number to the clinic Dept: [phone] and follow the prompts.   For any non-urgent questions, you may also contact your provider using MyChart. We now offer e-Visits for anyone 47 and older to request care online for non-urgent symptoms. For details visit [email or web address].   Also download the MyChart app! Go to the app store, search "MyChart", open the app, select , and log in with your MyChart username and password.  Irinotecan Injection What is this medication? IRINOTECAN (ir in oh TEE kan) treats some types of cancer. It works by slowing down the growth of cancer cells. This medicine may be used for other purposes; ask your health care provider or pharmacist if you have questions. COMMON BRAND NAME(S): Camptosar What should I tell my care team before I take this medication? They need to know if you have any of these conditions: Dehydration Diarrhea Infection, especially a viral infection, such as chickenpox, cold sores, herpes Liver disease Low blood  cell levels (white cells, red cells, and platelets) Low levels of electrolytes, such as calcium, magnesium, or potassium in your blood Recent or ongoing radiation An unusual or allergic reaction to irinotecan, other medications, foods, dyes, or preservatives If you or your partner are  pregnant or trying to get pregnant Breast-feeding How should I use this medication? This medication is injected into a vein. It is given by your care team in a hospital or clinic setting. Talk to your care team about the use of this medication in children. Special care may be needed. Overdosage: If you think you have taken too much of this medicine contact a poison control center or emergency room at once. NOTE: This medicine is only for you. Do not share this medicine with others. What if I miss a dose? Keep appointments for follow-up doses. It is important not to miss your dose. Call your care team if you are unable to keep an appointment. What may interact with this medication? Do not take this medication with any of the following: Cobicistat Itraconazole This medication may also interact with the following: Certain antibiotics, such as clarithromycin, rifampin, rifabutin Certain antivirals for HIV or AIDS Certain medications for fungal infections, such as ketoconazole, posaconazole, voriconazole Certain medications for seizures, such as carbamazepine, phenobarbital, phenytoin Gemfibrozil Nefazodone St. John's wort This list may not describe all possible interactions. Give your health care provider a list of all the medicines, herbs, non-prescription drugs, or dietary supplements you use. Also tell them if you smoke, drink alcohol, or use illegal drugs. Some items may interact with your medicine. What should I watch for while using this medication? Your condition will be monitored carefully while you are receiving this medication. You may need blood work while taking this medication. This medication may make you feel generally unwell. This is not uncommon as chemotherapy can affect healthy cells as well as cancer cells. Report any side effects. Continue your course of treatment even though you feel ill unless your care team tells you to stop. This medication can cause serious side  effects. To reduce the risk, your care team may give you other medications to take before receiving this one. Be sure to follow the directions from your care team. This medication may affect your coordination, reaction time, or judgement. Do not drive or operate machinery until you know how this medication affects you. Sit up or stand slowly to reduce the risk of dizzy or fainting spells. Drinking alcohol with this medication can increase the risk of these side effects. This medication may increase your risk of getting an infection. Call your care team for advice if you get a fever, chills, sore throat, or other symptoms of a cold or flu. Do not treat yourself. Try to avoid being around people who are sick. Avoid taking medications that contain aspirin, acetaminophen, ibuprofen, naproxen, or ketoprofen unless instructed by your care team. These medications may hide a fever. This medication may increase your risk to bruise or bleed. Call your care team if you notice any unusual bleeding. Be careful brushing or flossing your teeth or using a toothpick because you may get an infection or bleed more easily. If you have any dental work done, tell your dentist you are receiving this medication. Talk to your care team if you or your partner are pregnant or think either of you might be pregnant. This medication can cause serious birth defects if taken during pregnancy and for 6 months after the last dose. You will need  a negative pregnancy test before starting this medication. Contraception is recommended while taking this medication and for 6 months after the last dose. Your care team can help you find the option that works for you. Do not father a child while taking this medication and for 3 months after the last dose. Use a condom for contraception during this time period. Do not breastfeed while taking this medication and for 7 days after the last dose. This medication may cause infertility. Talk to your care  team if you are concerned about your fertility. What side effects may I notice from receiving this medication? Side effects that you should report to your care team as soon as possible: Allergic reactions--skin rash, itching, hives, swelling of the face, lips, tongue, or throat Dry cough, shortness of breath or trouble breathing Increased saliva or tears, increased sweating, stomach cramping, diarrhea, small pupils, unusual weakness or fatigue, slow heartbeat Infection--fever, chills, cough, sore throat, wounds that don't heal, pain or trouble when passing urine, general feeling of discomfort or being unwell Kidney injury--decrease in the amount of urine, swelling of the ankles, hands, or feet Low red blood cell level--unusual weakness or fatigue, dizziness, headache, trouble breathing Severe or prolonged diarrhea Unusual bruising or bleeding Side effects that usually do not require medical attention (report to your care team if they continue or are bothersome): Constipation Diarrhea Hair loss Loss of appetite Nausea Stomach pain This list may not describe all possible side effects. Call your doctor for medical advice about side effects. You may report side effects to FDA at 1-800-FDA-1088. Where should I keep my medication? This medication is given in a hospital or clinic. It will not be stored at home. NOTE: This sheet is a summary. It may not cover all possible information. If you have questions about this medicine, talk to your doctor, pharmacist, or health care provider.  2024 Elsevier/Gold Standard (2021-07-29 00:00:00)   Leucovorin Injection What is this medication? LEUCOVORIN (loo koe VOR in) prevents side effects from certain medications, such as methotrexate. It works by increasing folate levels. This helps protect healthy cells in your body. It may also be used to treat anemia caused by low levels of folate. It can also be used with fluorouracil, a type of chemotherapy, to  treat colorectal cancer. It works by increasing the effects of fluorouracil in the body. This medicine may be used for other purposes; ask your health care provider or pharmacist if you have questions. What should I tell my care team before I take this medication? They need to know if you have any of these conditions: Anemia from low levels of vitamin B12 in the blood An unusual or allergic reaction to leucovorin, folic acid, other medications, foods, dyes, or preservatives Pregnant or trying to get pregnant Breastfeeding How should I use this medication? This medication is injected into a vein or a muscle. It is given by your care team in a hospital or clinic setting. Talk to your care team about the use of this medication in children. Special care may be needed. Overdosage: If you think you have taken too much of this medicine contact a poison control center or emergency room at once. NOTE: This medicine is only for you. Do not share this medicine with others. What if I miss a dose? Keep appointments for follow-up doses. It is important not to miss your dose. Call your care team if you are unable to keep an appointment. What may interact with this medication? Capecitabine Fluorouracil  Phenobarbital Phenytoin Primidone Trimethoprim;sulfamethoxazole This list may not describe all possible interactions. Give your health care provider a list of all the medicines, herbs, non-prescription drugs, or dietary supplements you use. Also tell them if you smoke, drink alcohol, or use illegal drugs. Some items may interact with your medicine. What should I watch for while using this medication? Your condition will be monitored carefully while you are receiving this medication. This medication may increase the side effects of 5-fluorouracil. Tell your care team if you have diarrhea or mouth sores that do not get better or that get worse. What side effects may I notice from receiving this  medication? Side effects that you should report to your care team as soon as possible: Allergic reactions--skin rash, itching, hives, swelling of the face, lips, tongue, or throat This list may not describe all possible side effects. Call your doctor for medical advice about side effects. You may report side effects to FDA at 1-800-FDA-1088. Where should I keep my medication? This medication is given in a hospital or clinic. It will not be stored at home. NOTE: This sheet is a summary. It may not cover all possible information. If you have questions about this medicine, talk to your doctor, pharmacist, or health care provider.  2024 Elsevier/Gold Standard (2021-08-20 00:00:00)   Fluorouracil Injection What is this medication? FLUOROURACIL (flure oh YOOR a sil) treats some types of cancer. It works by slowing down the growth of cancer cells. This medicine may be used for other purposes; ask your health care provider or pharmacist if you have questions. COMMON BRAND NAME(S): Adrucil What should I tell my care team before I take this medication? They need to know if you have any of these conditions: Blood disorders Dihydropyrimidine dehydrogenase (DPD) deficiency Infection, such as chickenpox, cold sores, herpes Kidney disease Liver disease Poor nutrition Recent or ongoing radiation therapy An unusual or allergic reaction to fluorouracil, other medications, foods, dyes, or preservatives If you or your partner are pregnant or trying to get pregnant Breast-feeding How should I use this medication? This medication is injected into a vein. It is administered by your care team in a hospital or clinic setting. Talk to your care team about the use of this medication in children. Special care may be needed. Overdosage: If you think you have taken too much of this medicine contact a poison control center or emergency room at once. NOTE: This medicine is only for you. Do not share this medicine  with others. What if I miss a dose? Keep appointments for follow-up doses. It is important not to miss your dose. Call your care team if you are unable to keep an appointment. What may interact with this medication? Do not take this medication with any of the following: Live virus vaccines This medication may also interact with the following: Medications that treat or prevent blood clots, such as warfarin, enoxaparin, dalteparin This list may not describe all possible interactions. Give your health care provider a list of all the medicines, herbs, non-prescription drugs, or dietary supplements you use. Also tell them if you smoke, drink alcohol, or use illegal drugs. Some items may interact with your medicine. What should I watch for while using this medication? Your condition will be monitored carefully while you are receiving this medication. This medication may make you feel generally unwell. This is not uncommon as chemotherapy can affect healthy cells as well as cancer cells. Report any side effects. Continue your course of treatment even though you  feel ill unless your care team tells you to stop. In some cases, you may be given additional medications to help with side effects. Follow all directions for their use. This medication may increase your risk of getting an infection. Call your care team for advice if you get a fever, chills, sore throat, or other symptoms of a cold or flu. Do not treat yourself. Try to avoid being around people who are sick. This medication may increase your risk to bruise or bleed. Call your care team if you notice any unusual bleeding. Be careful brushing or flossing your teeth or using a toothpick because you may get an infection or bleed more easily. If you have any dental work done, tell your dentist you are receiving this medication. Avoid taking medications that contain aspirin, acetaminophen, ibuprofen, naproxen, or ketoprofen unless instructed by your care  team. These medications may hide a fever. Do not treat diarrhea with over the counter products. Contact your care team if you have diarrhea that lasts more than 2 days or if it is severe and watery. This medication can make you more sensitive to the sun. Keep out of the sun. If you cannot avoid being in the sun, wear protective clothing and sunscreen. Do not use sun lamps, tanning beds, or tanning booths. Talk to your care team if you or your partner wish to become pregnant or think you might be pregnant. This medication can cause serious birth defects if taken during pregnancy and for 3 months after the last dose. A reliable form of contraception is recommended while taking this medication and for 3 months after the last dose. Talk to your care team about effective forms of contraception. Do not father a child while taking this medication and for 3 months after the last dose. Use a condom while having sex during this time period. Do not breastfeed while taking this medication. This medication may cause infertility. Talk to your care team if you are concerned about your fertility. What side effects may I notice from receiving this medication? Side effects that you should report to your care team as soon as possible: Allergic reactions--skin rash, itching, hives, swelling of the face, lips, tongue, or throat Heart attack--pain or tightness in the chest, shoulders, arms, or jaw, nausea, shortness of breath, cold or clammy skin, feeling faint or lightheaded Heart failure--shortness of breath, swelling of the ankles, feet, or hands, sudden weight gain, unusual weakness or fatigue Heart rhythm changes--fast or irregular heartbeat, dizziness, feeling faint or lightheaded, chest pain, trouble breathing High ammonia level--unusual weakness or fatigue, confusion, loss of appetite, nausea, vomiting, seizures Infection--fever, chills, cough, sore throat, wounds that don't heal, pain or trouble when passing urine,  general feeling of discomfort or being unwell Low red blood cell level--unusual weakness or fatigue, dizziness, headache, trouble breathing Pain, tingling, or numbness in the hands or feet, muscle weakness, change in vision, confusion or trouble speaking, loss of balance or coordination, trouble walking, seizures Redness, swelling, and blistering of the skin over hands and feet Severe or prolonged diarrhea Unusual bruising or bleeding Side effects that usually do not require medical attention (report to your care team if they continue or are bothersome): Dry skin Headache Increased tears Nausea Pain, redness, or swelling with sores inside the mouth or throat Sensitivity to light Vomiting This list may not describe all possible side effects. Call your doctor for medical advice about side effects. You may report side effects to FDA at 1-800-FDA-1088. Where should I keep  my medication? This medication is given in a hospital or clinic. It will not be stored at home. NOTE: This sheet is a summary. It may not cover all possible information. If you have questions about this medicine, talk to your doctor, pharmacist, or health care provider.  2024 Elsevier/Gold Standard (2021-07-23 00:00:00)   The chemotherapy medication bag should finish at 46 hours, 96 hours, or 7 days. For example, if your pump is scheduled for 46 hours and it was put on at 4:00 p.m., it should finish at 2:00 p.m. the day it is scheduled to come off regardless of your appointment time.     Estimated time to finish at 12:00 pm on Thursday 02/05/2023.   If the display on your pump reads "Low Volume" and it is beeping, take the batteries out of the pump and come to the cancer center for it to be taken off.   If the pump alarms go off prior to the pump reading "Low Volume" then call [phone] and someone can assist you.  If the plunger comes out and the chemotherapy medication is leaking out, please use your home chemo  spill kit to clean up the spill. Do NOT use paper towels or other household products.  If you have problems or questions regarding your pump, please call either [phone] (24 hours a day) or the cancer center Monday-Friday 8:00 a.m.- 4:30 p.m. at the clinic number and we will assist you. If you are unable to get assistance, then go to the nearest Emergency Department and ask the staff to contact the IV team for assistance.

## 2023-02-03 NOTE — Progress Notes (Signed)
Patient seen by Lonna Cobb NP today  Vitals are within treatment parameters:Yes   Labs are within treatment parameters: No (Please specify and give further instructions.) urine protein 100   Treatment plan has been signed: Yes   Per physician team, Patient is ready for treatment. Please note the following modifications: No Avastin

## 2023-02-04 ENCOUNTER — Ambulatory Visit: Payer: BC Managed Care – PPO | Admitting: Nurse Practitioner

## 2023-02-04 ENCOUNTER — Other Ambulatory Visit: Payer: BC Managed Care – PPO

## 2023-02-04 ENCOUNTER — Ambulatory Visit: Payer: BC Managed Care – PPO

## 2023-02-04 DIAGNOSIS — C19 Malignant neoplasm of rectosigmoid junction: Secondary | ICD-10-CM | POA: Diagnosis not present

## 2023-02-05 ENCOUNTER — Inpatient Hospital Stay: Payer: BC Managed Care – PPO

## 2023-02-05 VITALS — BP 130/83 | HR 89 | Temp 98.2°F | Resp 18

## 2023-02-05 DIAGNOSIS — Z5189 Encounter for other specified aftercare: Secondary | ICD-10-CM | POA: Diagnosis not present

## 2023-02-05 DIAGNOSIS — C186 Malignant neoplasm of descending colon: Secondary | ICD-10-CM | POA: Diagnosis not present

## 2023-02-05 DIAGNOSIS — Z5111 Encounter for antineoplastic chemotherapy: Secondary | ICD-10-CM | POA: Diagnosis not present

## 2023-02-05 DIAGNOSIS — R809 Proteinuria, unspecified: Secondary | ICD-10-CM | POA: Diagnosis not present

## 2023-02-05 DIAGNOSIS — C7951 Secondary malignant neoplasm of bone: Secondary | ICD-10-CM | POA: Diagnosis not present

## 2023-02-05 DIAGNOSIS — Z23 Encounter for immunization: Secondary | ICD-10-CM | POA: Diagnosis not present

## 2023-02-05 MED ORDER — SODIUM CHLORIDE 0.9% FLUSH
10.0000 mL | INTRAVENOUS | Status: DC | PRN
Start: 2023-02-05 — End: 2023-02-05
  Administered 2023-02-05: 10 mL

## 2023-02-05 MED ORDER — HEPARIN SOD (PORK) LOCK FLUSH 100 UNIT/ML IV SOLN
500.0000 [IU] | Freq: Once | INTRAVENOUS | Status: AC | PRN
  Administered 2023-02-05: 500 [IU]

## 2023-02-05 MED ORDER — PEGFILGRASTIM INJECTION 6 MG/0.6ML ~~LOC~~
6.0000 mg | PREFILLED_SYRINGE | Freq: Once | SUBCUTANEOUS | Status: AC
Start: 2023-02-05 — End: 2023-02-05
  Administered 2023-02-05: 6 mg via SUBCUTANEOUS
  Filled 2023-02-05: qty 0.6

## 2023-02-05 NOTE — Patient Instructions (Signed)
Heparin Injection What is this medication? HEPARIN INJECTION (HEP a rin) prevents and treats blood clots. It belongs to a group of medications called blood thinners. This medicine may be used for other purposes; ask your health care provider or pharmacist if you have questions. COMMON BRAND NAME(S): Hep-Lock, Hep-Lock U/P, Hepflush-10, Monoject Prefill Advanced Heparin Lock Flush, SASH Normal Saline and Heparin What should I tell my care team before I take this medication? They need to know if you have any of these conditions: Bleeding disorders, such as hemophilia or low blood platelets Bowel disease or diverticulitis Endocarditis High blood pressure Liver disease Recent surgery Stomach ulcers An unusual or allergic reaction to heparin, other medications, foods, dyes, or preservatives Pregnant, trying to get pregnant, or recently pregnant Breastfeeding How should I use this medication? This medication is injected into a vein or under the skin. It is usually given by your care team in a hospital or clinic setting. If you get this medication at home, you will be taught how to prepare and give it. For your therapy to work as well as possible, take each dose exactly as prescribed on the prescription label. Do not skip doses. Skipping doses or stopping this medication can increase your risk of a blood clot. Keep taking this medication unless your care team tells you to stop. It is important that you put your used needles and syringes in a special sharps container. Do not put them in a trash can. If you do not have a sharps container, call your pharmacist or care team to get one. Talk to your care team about the use of this medication in children. While this medication may be prescribed for children for selected conditions, precautions do apply. Overdosage: If you think you have taken too much of this medicine contact a poison control center or emergency room at once. NOTE: This medicine is only  for you. Do not share this medicine with others. What if I miss a dose? If you miss a dose, take it as soon as you can. If it is almost time for your next dose, take only that dose. Do not take double or extra doses. What may interact with this medication? Do not take this medication with any of the following: Aspirin and aspirin-like medications Mifepristone Medications that treat or prevent blood clots, such as warfarin, enoxaparin, dalteparin Palifermin Protamine This medication may also interact with the following: Dextran Digoxin Hydroxychloroquine Medications for treating colds or allergies Nicotine NSAIDs, medications for pain and inflammation, such as ibuprofen or naproxen Phenylbutazone Tetracycline antibiotics This list may not describe all possible interactions. Give your health care provider a list of all the medicines, herbs, non-prescription drugs, or dietary supplements you use. Also tell them if you smoke, drink alcohol, or use illegal drugs. Some items may interact with your medicine. What should I watch for while using this medication? Visit your care team for regular checks on your progress. You may need blood work while taking this medication. Your condition will be monitored carefully while you are receiving this medication. Wear a medical ID bracelet or chain, and carry a card that describes your disease and details of your medication and dosage times. Notify your care team at once if you have cold, blue hands or feet. If you are going to need surgery or other procedure, tell your care team that you are using this medication. Avoid sports and activities that might cause injury while you are using this medication. Severe falls or injuries can  cause unseen bleeding. Be careful when using sharp tools or knives. Consider using an Neurosurgeon. Take special care brushing or flossing your teeth. Report any injuries, bruising, or red spots on the skin to your care  team. Using this medication for a long time may weaken your bones. The risk of bone fractures may be increased. Talk to your care team about your bone health. You should make sure that you get enough calcium and vitamin D while you are taking this medication. Discuss the foods you eat and the vitamins you take with your care team. Wear a medical ID bracelet or chain. Carry a card that describes your condition. List the medications and doses you take on the card. What side effects may I notice from receiving this medication? Side effects that you should report to your care team as soon as possible: Allergic reactions--skin rash, itching, hives, swelling of the face, lips, tongue, or throat Bleeding--bloody or black, tar-like stools, vomiting blood or brown material that looks like coffee grounds, red or dark brown urine, small red or purple spots on skin, unusual bruising or bleeding Blood clot--pain, swelling, or warmth in the leg, shortness of breath, chest pain Side effects that usually do not require medical attention (report to your care team if they continue or are bothersome): Pain, redness, or irritation at injection site This list may not describe all possible side effects. Call your doctor for medical advice about side effects. You may report side effects to FDA at 1-800-FDA-1088. Where should I keep my medication? Keep out of the reach of children and pets. Store unopened vials at room temperature between 15 and 30 degrees C (59 and 86 degrees F). Do not freeze. Do not use if solution is discolored or particulate matter is present. To get rid of medications that are no longer needed or have expired: Take the medication to a medication take-back program. Check with your pharmacy or law enforcement to find a location. If you cannot return the medication, ask your pharmacist or care team how to get rid of this medication safely. NOTE: This sheet is a summary. It may not cover all possible  information. If you have questions about this medicine, talk to your doctor, pharmacist, or health care provider.  2024 Elsevier/Gold Standard (2021-09-05 00:00:00)   Pegfilgrastim Injection What is this medication? PEGFILGRASTIM (PEG fil gra stim) lowers the risk of infection in people who are receiving chemotherapy. It works by Systems analyst make more white blood cells, which protects your body from infection. It may also be used to help people who have been exposed to high doses of radiation. This medicine may be used for other purposes; ask your health care provider or pharmacist if you have questions. COMMON BRAND NAME(S): Cherly Hensen, Neulasta, Nyvepria, Stimufend, UDENYCA, UDENYCA ONBODY, Ziextenzo What should I tell my care team before I take this medication? They need to know if you have any of these conditions: Kidney disease Latex allergy Ongoing radiation therapy Sickle cell disease Skin reactions to acrylic adhesives (On-Body Injector only) An unusual or allergic reaction to pegfilgrastim, filgrastim, other medications, foods, dyes, or preservatives Pregnant or trying to get pregnant Breast-feeding How should I use this medication? This medication is for injection under the skin. If you get this medication at home, you will be taught how to prepare and give the pre-filled syringe or how to use the On-body Injector. Refer to the patient Instructions for Use for detailed instructions. Use exactly as directed. Tell your  care team immediately if you suspect that the On-body Injector may not have performed as intended or if you suspect the use of the On-body Injector resulted in a missed or partial dose. It is important that you put your used needles and syringes in a special sharps container. Do not put them in a trash can. If you do not have a sharps container, call your pharmacist or care team to get one. Talk to your care team about the use of this medication in  children. While this medication may be prescribed for selected conditions, precautions do apply. Overdosage: If you think you have taken too much of this medicine contact a poison control center or emergency room at once. NOTE: This medicine is only for you. Do not share this medicine with others. What if I miss a dose? It is important not to miss your dose. Call your care team if you miss your dose. If you miss a dose due to an On-body Injector failure or leakage, a new dose should be administered as soon as possible using a single prefilled syringe for manual use. What may interact with this medication? Interactions have not been studied. This list may not describe all possible interactions. Give your health care provider a list of all the medicines, herbs, non-prescription drugs, or dietary supplements you use. Also tell them if you smoke, drink alcohol, or use illegal drugs. Some items may interact with your medicine. What should I watch for while using this medication? Your condition will be monitored carefully while you are receiving this medication. You may need blood work done while you are taking this medication. Talk to your care team about your risk of cancer. You may be more at risk for certain types of cancer if you take this medication. If you are going to need a MRI, CT scan, or other procedure, tell your care team that you are using this medication (On-Body Injector only). What side effects may I notice from receiving this medication? Side effects that you should report to your care team as soon as possible: Allergic reactions--skin rash, itching, hives, swelling of the face, lips, tongue, or throat Capillary leak syndrome--stomach or muscle pain, unusual weakness or fatigue, feeling faint or lightheaded, decrease in the amount of urine, swelling of the ankles, hands, or feet, trouble breathing High white blood cell level--fever, fatigue, trouble breathing, night sweats, change in  vision, weight loss Inflammation of the aorta--fever, fatigue, back, chest, or stomach pain, severe headache Kidney injury (glomerulonephritis)--decrease in the amount of urine, red or dark brown urine, foamy or bubbly urine, swelling of the ankles, hands, or feet Shortness of breath or trouble breathing Spleen injury--pain in upper left stomach or shoulder Unusual bruising or bleeding Side effects that usually do not require medical attention (report to your care team if they continue or are bothersome): Bone pain Pain in the hands or feet This list may not describe all possible side effects. Call your doctor for medical advice about side effects. You may report side effects to FDA at 1-800-FDA-1088. Where should I keep my medication? Keep out of the reach of children. If you are using this medication at home, you will be instructed on how to store it. Throw away any unused medication after the expiration date on the label. NOTE: This sheet is a summary. It may not cover all possible information. If you have questions about this medicine, talk to your doctor, pharmacist, or health care provider.  2024 Elsevier/Gold Standard (2021-02-15 00:00:00)

## 2023-02-09 ENCOUNTER — Telehealth (HOSPITAL_BASED_OUTPATIENT_CLINIC_OR_DEPARTMENT_OTHER): Payer: Self-pay | Admitting: *Deleted

## 2023-02-09 ENCOUNTER — Ambulatory Visit: Payer: Self-pay | Admitting: General Surgery

## 2023-02-09 DIAGNOSIS — L723 Sebaceous cyst: Secondary | ICD-10-CM | POA: Diagnosis not present

## 2023-02-09 NOTE — Telephone Encounter (Signed)
   Pre-operative Risk Assessment    Patient Name: [name]  DOB: Jul 19, 1965 MRN: [id number]      Request for Surgical Clearance    Procedure:   DEBRIDEMENT OF SEBACEOUS CYST  Date of Surgery:  Clearance TBD                                 Surgeon:  Romie Levee, MD Surgeon's Group or Practice Name:  [facility] Phone number:  [phone] Fax number:  [phone]   Type of Clearance Requested:   - Medical  - Pharmacy:  Hold Aspirin NOT IINDICATED   Type of Anesthesia:  MAC   Additional requests/questions:    [name]   02/09/2023, 10:55 AM

## 2023-02-09 NOTE — H&P (Signed)
Expand All Collapse All    REFERRING PHYSICIAN:  Rana Snare, NP   PROVIDER:  Elenora Gamma, MD   MRN: [id number] DOB: 12-Sep-1965 DATE OF ENCOUNTER: 02/09/2023   Subjective    Chief Complaint: new cancer (Sebaceous cyst )       History of Present Illness: [name] is a 57 y.o. male who is seen today as an office consultation at the request of Lonna Cobb for evaluation of new cancer (Sebaceous cyst ) .  57 year old male who was diagnosed with metastatic adenocarcinoma.  He underwent a left colectomy in December 2020 by Dr Michaell Cowing.  CT at that time showed pulmonary nodules and indeterminate splenic mass.  He has been on chemotherapy since then.  He has developed a chronically infected and draining cystic lesion in the suprapubic area.  This has been treated with antibiotics in the past but has not resolved.  He is here today for further evaluation.  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     Review of Systems: A complete review of systems was obtained from the patient.  I have reviewed this information and discussed as appropriate with the patient.  See HPI as well for other ROS.     Medical History: Past Medical History      Past Medical History:  Diagnosis Date   Arthritis     Asthma, unspecified asthma severity, unspecified whether complicated, unspecified whether persistent (HHS-HCC)     Chronic kidney disease     Colon cancer metastasized to bone (CMS/HHS-HCC)     Colon polyps     Diabetes mellitus without complication (CMS/HHS-HCC)     DVT (deep venous thrombosis) (CMS/HHS-HCC)     Heart valve disease     History of cancer     History of prostate cancer     Hurthle cell adenoma 1972   Hyperlipidemia          Problem List     Patient Active Problem List  Diagnosis   History of Wilms' tumor   Solitary kidney, acquired   S/P MVR (mitral valve repair)   Port-A-Cath in place   Cancer of  descending colon (CMS/HHS-HCC)   Discitis of lumbar region   History of colon cancer   Type 2 diabetes mellitus (CMS/HHS-HCC)   Necrotizing soft tissue infection   Mitral regurgitation   Idiopathic scoliosis and kyphoscoliosis   Family history of colon cancer   Malignant neoplasm of colorectal area with metastasis (CMS/HHS-HCC)        Past Surgical History       Past Surgical History:  Procedure Laterality Date   NEPHRECTOMY Left 1972   knree repair Left 1989   THYROIDECTOMY W/LIMITED NECK DISSECTION   2010   REPAIR MITRAL VALVE   2021   colonectomy       HERNIA REPAIR            Allergies       Allergies  Allergen Reactions   Cat Dander Anaphylaxis   Empagliflozin Other (See Comments)      Recurrent UTI        Medications Ordered Prior to Encounter        Current Outpatient Medications on File Prior to Visit  Medication Sig Dispense Refill   acetaminophen (TYLENOL) 500 MG tablet Take 500 mg by mouth every 8 (eight) hours as needed for Pain or Fever       albuterol  MDI, PROVENTIL, VENTOLIN, PROAIR, HFA 90 mcg/actuation inhaler Inhale 1-2 Inhalations into the lungs every 6 (six) hours as needed       ASMANEX TWISTHALER 220 mcg/ actuation (30) inhaler         aspirin 81 MG chewable tablet Take by mouth       aspirin 81 MG EC tablet Take 81 mg by mouth once daily       atorvastatin (LIPITOR) 10 MG tablet Take 10 mg by mouth at bedtime       calcium carbonate 300 mg (750 mg) chewable tablet Take 300 mg of elemental by mouth once daily       cetirizine (ZYRTEC) 10 MG tablet Take 10 mg by mouth at bedtime       CONCENTRATED insulin glargine (TOUJEO SOLOSTAR U-300 INSULIN) pen injector (concentration 300 units/mL) Inject 33 Units subcutaneously once daily Takes in the morning       dabigatran (PRADAXA) 150 mg capsule Take 150 mg by mouth 2 (two) times daily       ibuprofen (MOTRIN) 200 MG tablet Take 200 mg by mouth every 8 (eight) hours as needed for Pain        lidocaine-prilocaine (EMLA) cream Apply 30 g topically once       lisinopriL (ZESTRIL) 10 MG tablet Take 10 mg by mouth every morning       lisinopriL (ZESTRIL) 2.5 MG tablet 1 tablet       loratadine (CLARITIN) 10 mg tablet Take 10 mg by mouth once daily as needed       metFORMIN (GLUCOPHAGE) 1000 MG tablet Take 1 tablet by mouth 2 (two) times daily with meals       metoprolol succinate (TOPROL-XL) 25 MG XL tablet Take 25 mg by mouth once daily       mometasone (ASMANEX TWISTHALER) 220 mcg/ actuation (60) inhaler Inhale 1 Puff into the lungs once daily       omega-3 fatty acids/fish oil (FISH OIL) 340-1,000 mg capsule Take 1 capsule by mouth 2 (two) times daily       pioglitazone (ACTOS) 30 MG tablet 1 tablet       prochlorperazine (COMPAZINE) 10 MG tablet Take 10 mg by mouth every 6 (six) hours as needed for Nausea or Vomiting       repaglinide (PRANDIN) 0.5 MG tablet Take 0.5 mg by mouth 2 (two) times daily before meals       repaglinide (PRANDIN) 2 MG tablet 1 tablet       TOUJEO SOLOSTAR U-300 INSULIN pen injector (concentration 300 units/mL) 26 units       traMADoL (ULTRAM) 50 mg tablet Take 50 mg by mouth every 6 (six) hours as needed        No current facility-administered medications on file prior to visit.        Family History       Family History  Problem Relation Age of Onset   Coronary Artery Disease (Blocked arteries around heart) Father     Hyperlipidemia (Elevated cholesterol) Sister     Thyroid cancer Maternal Aunt          Tobacco Use History  Social History       Tobacco Use  Smoking Status Never  Smokeless Tobacco Never        Social History  Social History        Socioeconomic History   Marital status: Married  Tobacco Use   Smoking status: Never   Smokeless tobacco: Never  Vaping Use   Vaping status: Never Used  Substance and Sexual Activity   Alcohol use: Not Currently   Drug use: Never   Sexual activity: Defer        Objective:          Vitals:    02/09/23 1002  BP: 118/80  Pulse: 108  Temp: 36.7 C (98 F)  SpO2: 98%  PainSc: 0-No pain      Exam Gen: NAD Abd: soft There is a 3 cm area of induration and fluctuance draining a bloody purulent discharge in the left groin just above the base of the penis.     Labs, Imaging and Diagnostic Testing:     Assessment and Plan:  Diagnoses and all orders for this visit:   Sebaceous cyst     Patient appears to have a chronically draining sebaceous cyst/wound in his left groin.  I recommended debridement in the operating room with plans for healing by secondary intention.  We discussed that this could be a prolonged healing due to his chemotherapy.  Patient is comfortable with this plan would like to proceed when possible.  I recommended that we do the surgery in between his chemotherapy appointments that occur every other week.   Vanita Panda, MD Colon and Rectal Surgery [facility]

## 2023-02-12 ENCOUNTER — Other Ambulatory Visit: Payer: Self-pay

## 2023-02-13 NOTE — Telephone Encounter (Signed)
He has echo scheduled for next week, if unremarkable no further workup recommended prior to his procedure

## 2023-02-13 NOTE — Telephone Encounter (Signed)
Patient with diagnosis of afib on Pradaxa for anticoagulation.    Procedure:  DEBRIDEMENT OF SEBACEOUS CYST  Date of procedure: TBD   CHA2DS2-VASc Score = 4   This indicates a 4.8% annual risk of stroke. 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    **This guidance is not considered finalized until pre-operative APP has relayed final recommendations.**

## 2023-02-15 ENCOUNTER — Other Ambulatory Visit: Payer: Self-pay | Admitting: Oncology

## 2023-02-15 DIAGNOSIS — C186 Malignant neoplasm of descending colon: Secondary | ICD-10-CM

## 2023-02-17 ENCOUNTER — Inpatient Hospital Stay: Payer: BC Managed Care – PPO

## 2023-02-17 ENCOUNTER — Inpatient Hospital Stay (HOSPITAL_BASED_OUTPATIENT_CLINIC_OR_DEPARTMENT_OTHER): Payer: BC Managed Care – PPO | Admitting: Oncology

## 2023-02-17 VITALS — BP 132/84 | HR 77 | Temp 98.1°F | Resp 18 | Ht 72.0 in | Wt 206.1 lb

## 2023-02-17 VITALS — BP 125/84 | HR 81 | Temp 97.3°F | Resp 18

## 2023-02-17 DIAGNOSIS — R809 Proteinuria, unspecified: Secondary | ICD-10-CM | POA: Diagnosis not present

## 2023-02-17 DIAGNOSIS — C7951 Secondary malignant neoplasm of bone: Secondary | ICD-10-CM | POA: Diagnosis not present

## 2023-02-17 DIAGNOSIS — C186 Malignant neoplasm of descending colon: Secondary | ICD-10-CM

## 2023-02-17 DIAGNOSIS — Z23 Encounter for immunization: Secondary | ICD-10-CM | POA: Diagnosis not present

## 2023-02-17 DIAGNOSIS — Z5189 Encounter for other specified aftercare: Secondary | ICD-10-CM | POA: Diagnosis not present

## 2023-02-17 DIAGNOSIS — Z5111 Encounter for antineoplastic chemotherapy: Secondary | ICD-10-CM | POA: Diagnosis not present

## 2023-02-17 LAB — CBC WITH DIFFERENTIAL (CANCER CENTER ONLY)
Abs Immature Granulocytes: 2.55 10*3/uL — ABNORMAL HIGH (ref 0.00–0.07)
Basophils Absolute: 0.1 10*3/uL (ref 0.0–0.1)
Basophils Relative: 1 %
Eosinophils Absolute: 0.7 10*3/uL — ABNORMAL HIGH (ref 0.0–0.5)
Eosinophils Relative: 5 %
HCT: 35.3 % — ABNORMAL LOW (ref 39.0–52.0)
Hemoglobin: 11.7 g/dL — ABNORMAL LOW (ref 13.0–17.0)
Immature Granulocytes: 17 %
Lymphocytes Relative: 8 %
Lymphs Abs: 1.2 10*3/uL (ref 0.7–4.0)
MCH: 33.5 pg (ref 26.0–34.0)
MCHC: 33.1 g/dL (ref 30.0–36.0)
MCV: 101.1 fL — ABNORMAL HIGH (ref 80.0–100.0)
Monocytes Absolute: 1.6 10*3/uL — ABNORMAL HIGH (ref 0.1–1.0)
Monocytes Relative: 11 %
Neutro Abs: 8.4 10*3/uL — ABNORMAL HIGH (ref 1.7–7.7)
Neutrophils Relative %: 58 %
Platelet Count: 148 10*3/uL — ABNORMAL LOW (ref 150–400)
RBC: 3.49 MIL/uL — ABNORMAL LOW (ref 4.22–5.81)
RDW: 15.8 % — ABNORMAL HIGH (ref 11.5–15.5)
WBC Count: 14.7 10*3/uL — ABNORMAL HIGH (ref 4.0–10.5)
nRBC: 0.5 % — ABNORMAL HIGH (ref 0.0–0.2)

## 2023-02-17 LAB — CMP (CANCER CENTER ONLY)
ALT: 11 U/L (ref 0–44)
AST: 18 U/L (ref 15–41)
Albumin: 3.5 g/dL (ref 3.5–5.0)
Alkaline Phosphatase: 303 U/L — ABNORMAL HIGH (ref 38–126)
Anion gap: 9 (ref 5–15)
BUN: 20 mg/dL (ref 6–20)
CO2: 25 mmol/L (ref 22–32)
Calcium: 9.3 mg/dL (ref 8.9–10.3)
Chloride: 100 mmol/L (ref 98–111)
Creatinine: 0.99 mg/dL (ref 0.61–1.24)
GFR, Estimated: >60 mL/min (ref >60)
Glucose, Bld: 198 mg/dL — ABNORMAL HIGH (ref 70–99)
Potassium: 4.7 mmol/L (ref 3.5–5.1)
Sodium: 134 mmol/L — ABNORMAL LOW (ref 135–145)
Total Bilirubin: 0.6 mg/dL (ref <1.2)
Total Protein: 6.5 g/dL (ref 6.5–8.1)

## 2023-02-17 LAB — TOTAL PROTEIN, URINE DIPSTICK: Protein, ur: 100 mg/dL — AB

## 2023-02-17 LAB — CEA (ACCESS): CEA (CHCC): 11.37 ng/mL — ABNORMAL HIGH (ref 0.00–5.00)

## 2023-02-17 MED ORDER — FLUOROURACIL CHEMO INJECTION 2.5 GM/50ML
400.0000 mg/m2 | Freq: Once | INTRAVENOUS | Status: AC
Start: 2023-02-17 — End: 2023-02-17
  Administered 2023-02-17: 850 mg via INTRAVENOUS
  Filled 2023-02-17: qty 17

## 2023-02-17 MED ORDER — INFLUENZA VIRUS VACC SPLIT PF (FLUZONE) 0.5 ML IM SUSY
0.5000 mL | PREFILLED_SYRINGE | Freq: Once | INTRAMUSCULAR | Status: AC
  Administered 2023-02-17: 0.5 mL via INTRAMUSCULAR
  Filled 2023-02-17: qty 0.5

## 2023-02-17 MED ORDER — FLUOROURACIL CHEMO INJECTION 5 GM/100ML
2400.0000 mg/m2 | INTRAVENOUS | Status: DC
  Administered 2023-02-17: 5000 mg via INTRAVENOUS
  Filled 2023-02-17: qty 100

## 2023-02-17 MED ORDER — DEXAMETHASONE SODIUM PHOSPHATE 10 MG/ML IJ SOLN
10.0000 mg | Freq: Once | INTRAMUSCULAR | Status: AC
  Administered 2023-02-17: 10 mg via INTRAVENOUS
  Filled 2023-02-17: qty 1

## 2023-02-17 MED ORDER — PALONOSETRON HCL INJECTION 0.25 MG/5ML
0.2500 mg | Freq: Once | INTRAVENOUS | Status: AC
  Administered 2023-02-17: 0.25 mg via INTRAVENOUS
  Filled 2023-02-17: qty 5

## 2023-02-17 MED ORDER — SODIUM CHLORIDE 0.9 % IV SOLN: Freq: Once | INTRAVENOUS | Status: AC

## 2023-02-17 MED ORDER — SODIUM CHLORIDE 0.9 % IV SOLN
140.0000 mg/m2 | Freq: Once | INTRAVENOUS | Status: AC
  Administered 2023-02-17: 300 mg via INTRAVENOUS
  Filled 2023-02-17: qty 15

## 2023-02-17 MED ORDER — SODIUM CHLORIDE 0.9 % IV SOLN
400.0000 mg/m2 | Freq: Once | INTRAVENOUS | Status: AC
  Administered 2023-02-17: 872 mg via INTRAVENOUS
  Filled 2023-02-17: qty 43.6

## 2023-02-17 MED ORDER — ATROPINE SULFATE 1 MG/ML IV SOLN
0.5000 mg | Freq: Once | INTRAVENOUS | Status: AC | PRN
  Administered 2023-02-17: 0.5 mg via INTRAVENOUS
  Filled 2023-02-17: qty 1

## 2023-02-17 NOTE — Progress Notes (Signed)
Patient seen by Dr. Thornton Papas today  Vitals are within treatment parameters:Yes   Labs are within treatment parameters: No (Please specify and give further instructions.)  Urine protein 100--OK to proceed  Treatment plan has been signed: Yes   Per physician team, Patient is ready for treatment and there are NO modifications to the treatment plan.

## 2023-02-17 NOTE — Patient Instructions (Signed)
[facility] - A DEPT OF [facility]  Discharge Instructions: Thank you for choosing [facility] to provide your oncology and hematology care.   If you have a lab appointment with the Cancer Center, please go directly to the Cancer Center and check in at the registration area.   Wear comfortable clothing and clothing appropriate for easy access to any Portacath or PICC line.   We strive to give you quality time with your provider. You may need to reschedule your appointment if you arrive late (15 or more minutes).  Arriving late affects you and other patients whose appointments are after yours.  Also, if you miss three or more appointments without notifying the office, you may be dismissed from the clinic at the provider's discretion.      For prescription refill requests, have your pharmacy contact our office and allow 72 hours for refills to be completed.    Today you received the following chemotherapy and/or immunotherapy agents Bevacizumab-awwb, Irinotecan, Leucovorin and Fluorouracil.      To help prevent nausea and vomiting after your treatment, we encourage you to take your nausea medication as directed.  BELOW ARE SYMPTOMS THAT SHOULD BE REPORTED IMMEDIATELY: *FEVER GREATER THAN 100.4 F (38 C) OR HIGHER *CHILLS OR SWEATING *NAUSEA AND VOMITING THAT IS NOT CONTROLLED WITH YOUR NAUSEA MEDICATION *UNUSUAL SHORTNESS OF BREATH *UNUSUAL BRUISING OR BLEEDING *URINARY PROBLEMS (pain or burning when urinating, or frequent urination) *BOWEL PROBLEMS (unusual diarrhea, constipation, pain near the anus) TENDERNESS IN MOUTH AND THROAT WITH OR WITHOUT PRESENCE OF ULCERS (sore throat, sores in mouth, or a toothache) UNUSUAL RASH, SWELLING OR PAIN  UNUSUAL VAGINAL DISCHARGE OR ITCHING   Items with * indicate a potential emergency and should be followed up as soon as possible or go to the Emergency Department if any problems should occur.  Please  show the CHEMOTHERAPY ALERT CARD or IMMUNOTHERAPY ALERT CARD at check-in to the Emergency Department and triage nurse.  Should you have questions after your visit or need to cancel or reschedule your appointment, please contact [facility] - A DEPT OF [facility]  Dept: [phone]  and follow the prompts.  Office hours are 8:00 a.m. to 4:30 p.m. Monday - Friday. Please note that voicemails left after 4:00 p.m. may not be returned until the following business day.  We are closed weekends and major holidays. You have access to a nurse at all times for urgent questions. Please call the main number to the clinic Dept: [phone] and follow the prompts.   For any non-urgent questions, you may also contact your provider using MyChart. We now offer e-Visits for anyone 46 and older to request care online for non-urgent symptoms. For details visit [email or web address].   Also download the MyChart app! Go to the app store, search "MyChart", open the app, select [facility], and log in with your MyChart username and password.

## 2023-02-17 NOTE — Progress Notes (Signed)
[facility] OFFICE PROGRESS NOTE   Diagnosis: Colon cancer  INTERVAL HISTORY:   [name] completed another cycle of FOLFIRI on 02/03/2023.  No nausea/vomiting, mouth sores, or diarrhea.  He continues to have pain in the lower back/pelvis and left leg.  The pain is mild at present. He saw Dr. Johny Blamer 02/04/2023.  She recommends continuing FOLFIRI.  She discussed the possible need for treatment of prostate cancer so that he could qualify for a clinical trial. Dr. Maisie Fus plans to perform surgery on the suprapubic cyst. Objective:  Vital signs in last 24 hours:  Blood pressure 132/84, pulse 77, temperature 98.1 F (36.7 C), temperature source Temporal, resp. rate 18, height 6' (1.829 m), weight 206 lb 1.6 oz (93.5 kg), SpO2 100%.    HEENT: No thrush or ulcers Resp: Lungs clear bilaterally Cardio: Regular rate and rhythm GI: No hepatosplenomegaly Vascular: No leg edema  Skin: 1-2 cm area of cutaneous induration in the suprapubic area to the left of midline, no drainage at present  Portacath/PICC-without erythema  Lab Results:  Lab Results  Component Value Date   WBC 14.7 (H) 02/17/2023   HGB 11.7 (L) 02/17/2023   HCT 35.3 (L) 02/17/2023   MCV 101.1 (H) 02/17/2023   PLT 148 (L) 02/17/2023   NEUTROABS 8.4 (H) 02/17/2023    CMP  Lab Results  Component Value Date   NA 134 (L) 02/17/2023   K 4.7 02/17/2023   CL 100 02/17/2023   CO2 25 02/17/2023   GLUCOSE 198 (H) 02/17/2023   BUN 20 02/17/2023   CREATININE 0.99 02/17/2023   CALCIUM 9.3 02/17/2023   PROT 6.5 02/17/2023   ALBUMIN 3.5 02/17/2023   AST 18 02/17/2023   ALT 11 02/17/2023   ALKPHOS 303 (H) 02/17/2023   BILITOT 0.6 02/17/2023   GFRNONAA >60 02/17/2023   GFRAA 115 02/08/2020    Lab Results  Component Value Date   CEA1 2.00 07/13/2020   CEA 11.37 (H) 02/17/2023    Medications: I have reviewed the patient's current medications.   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, status post a left colectomy 03/04/2019 Mass felt to be in the transverse colon on a colonoscopy 01/07/2019 with a biopsy confirming poorly differentiated adenocarcinoma with signet ring cell features, mismatch repair protein expression normal CT abdomen/pelvis 01/08/2019 12.3 cm indeterminate splenic mass, present in 2015 on a lumbar MRI-felt to be benign, small subpleural and pleural-based nodules at the lung bases, status post left nephrectomy CT chest 01/20/2019-small bilateral pulmonary nodules with rounded parenchymal nodules in the right lower lobe, indeterminate splenic mass CT chest 03/18/2019-multiple subcentimeter pulmonary nodules again seen bilaterally, greatest in the lower lobes, without significant change.  No new or enlarging pulmonary nodules or masses.  Partially visualized large heterogeneous splenic mass with no significant change.  Plan for follow-up chest CT at a 93-month interval. Cycle 1 FOLFOX 03/21/2019 Cycle 2 FOLFOX 04/04/2019 Cycle 3 FOLFOX 04/18/2019 (oxaliplatin dose reduced secondary to thrombocytopenia) Cycle 4 FOLFOX 05/02/2019 (oxaliplatin held secondary to thrombocytopenia) Cycle 5 FOLFOX 05/16/2019 Cycle 6 FOLFOX 05/30/2019 (oxaliplatin held secondary to thrombocytopenia) CT chest 06/08/2019-stable scattered small solid pulmonary nodules  Cycle 7 FOLFOX 06/13/2019 Cycle 8 FOLFOX 06/27/2019 Cycle 9 FOLFOX 07/11/2019 Cycle 10 FOLFOX 07/25/2019 (oxaliplatin held due to thrombocytopenia) Cycle 11 FOLFOX 08/08/2019 Cycle 12 FOLFOX 08/22/2019 CTs 01/09/2020-no evidence of recurrent disease, stable lung nodules favored to represent subpleural lymph nodes-dedicated follow-up not recommended CTs 01/11/2021-no  evidence of recurrent disease CTs 01/24/2022-no evidence of recurrent disease CT lumbar spine, abdomen/pelvis 04/11/2022-numerous mixed lytic and sclerotic lesions scattered  throughout the visualized axial and appendicular skeleton including several areas in the lumbar spine.   04/21/2022 CT biopsy sclerotic lesion right anterior iliac bone-consistent with metastatic colorectal adenocarcinoma, moderate to poorly differentiated.  Foundation 1-microsatellite stable, tumor mutation burden 4, K-ras G12S PET scan 05/09/2022-widespread hypermetabolic mixed faintly lytic and sclerotic osseous metastases throughout the axial and proximal appendicular skeleton; associated mild pathologic compression fracture at L3; no hypermetabolic extraosseous metastatic disease. Cycle 1 FOLFIRI/bevacizumab 05/26/2022 Cycle 2 FOLFIRI/bevacizumab 06/09/2022 Cycle 3 FOLFIRI 06/24/2022, bevacizumab 06/26/2022 Cycle 4 FOLFIRI 07/08/2022, bevacizumab held due to proteinuria Cycle 5 FOLFIRI 07/22/2022, bevacizumab held due to proteinuria CTs 07/31/2022-potentially areas of sclerosis represent interval healing; new lytic lesion at T12, multiple new areas of punched-out sclerosis throughout the visualized osseous structures.  Index lesion in the left iliac wing measures 1.3 x 2.8 cm.  Healing of previously metabolically occult metastatic disease on 05/09/2022 is a possibility. Cycle 6 FOLFIRI 08/04/2022, irinotecan dose reduced due to thrombocytopenia, bevacizumab held pending 24-hour urine result Cycle 7 FOLFIRI 08/18/2022, bevacizumab held secondary to proteinuria, G-CSF held Cycle 8 FOLFIRI 09/01/2022, bevacizumab held, G-CSF Cycle 9 FOLFIRI 09/16/2022, bevacizumab held, G-CSF CTs 09/29/2022-enlargement of a hypodense lesion posterior left lobe of the liver, similar diffuse mixed lytic and sclerotic osseous metastatic disease, multiple new small nodules medial right lung base, multiple other small bilateral pulmonary nodules unchanged. Cycle 10 FOLFIRI plus Avastin 10/08/2022 Cycle 11 FOLFIRI 10/27/2022, Avastin held Cycle 12 FOLFIRI/Avastin 11/11/2022 Cycle 13 FOLFIRI/Avastin 11/25/2022 CTs 12/08/2022-stable lesion  posterior left lobe of the liver.  Unchanged diffuse lytic and sclerotic osseous metastatic disease.  Stable small solid pulmonary nodules.  Stable splenic lesions.  Slightly increased bladder wall thickening. Cycle 14 FOLFIRI/Avastin 12/10/2022 Cycle 15 FOLFIRI/Avastin 12/24/2022 12/31/2022 24-hour urine protein 710 mg Cycle 16 FOLFIRI/Avastin 01/07/2023 Cycle 17 FOLFIRI 01/21/2023, Avastin held secondary to persistent proteinuria, slight increase in creatinine, and drainage from the suprapubic lesion CTs 01/26/2023-unchanged widespread sclerotic osseous metastatic disease, unchanged hypodense liver lesions, occasional small bilateral lung nodules unchanged. 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  Left nephrectomy at age 85 12.  Port-A-Cath placement, Dr. Michaell Cowing, 03/16/2019 13.  Thrombocytopenia secondary to chemotherapy-oxaliplatin dose reduced beginning with cycle 3 FOLFOX, improved 14.  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  Right groin soft tissue infection May 2023-status postsurgical debridement 18.  Left leg DVT-common femoral, femoral, popliteal, posterior tibial, and peroneal veins 19.  02/04/2022-MRI cervical spine-1.7 cm T1 lesion-indeterminate; bone scan  03/14/2022-focal intense activity at approximate T1 level felt to correspond to the lesion on MRI, additional foci of activity involving the lower thoracic and lumbar spine, right iliac bone and pubic symphysis.  CT lumbar spine, abdomen/pelvis 04/11/2022-numerous mixed lytic and sclerotic lesions scattered throughout the visualized axial and appendicular skeleton including several areas in the lumbar spine.  04/21/2022 CT biopsy  sclerotic lesion right anterior iliac bone-consistent with metastatic colorectal adenocarcinoma, moderate to poorly differentiated 20.  Inflamed sebaceous cyst 08/18/2022-doxycycline    Disposition: Mr. Mattiello appears stable.  The CEA is slightly lower today.  He will complete another cycle of FOLFIRI today.  He will return for an office visit in FOLFIRI in 2 weeks.  Bevacizumab will remain on hold due to the plan for surgical drainage of the suprapubic cyst.  We discussed future treatment options including repeat treatment with FOLFOX and clinical trials.  He may consider treatment of the prostate cancer so that he could qualify for clinical trial, but I do not recommend this at present.  I will ask Dr. Berneice Heinrich for treatment options.  David Papas, MD  02/17/2023  10:03 AM

## 2023-02-17 NOTE — Telephone Encounter (Signed)
Requesting office sent duplicate request inquiring if pt has been cleared yet. I d/w the pre op APP today Bernadene Person, NP who states Dr. Dianah Field ordered an echo for the pt, which is scheduled for 02/20/23. Once echo has been completed and MD has reviewed. Our office will provide clearance once completed.   I also so in the chart that it looks like surgery has a date now of 03/19/23. However when the request was faxed to our office it was stated as TBD for surgery date.   I will fax notes to surgeon's office as FYI.

## 2023-02-17 NOTE — Patient Instructions (Signed)

## 2023-02-18 ENCOUNTER — Encounter: Payer: Self-pay | Admitting: Oncology

## 2023-02-18 ENCOUNTER — Inpatient Hospital Stay: Payer: BC Managed Care – PPO

## 2023-02-18 NOTE — Progress Notes (Signed)
Patient seen today to trouble shoot his portable home pump infusion. Patient reported that about 7 pm last night, the pump started to beep "high pressure occlusion". After trying to find and fix any possible kinks to no avail, he called the infusystem help line and the agent was unable to resolve issue and advised patient to come into the clinic.   This writer turned on pump and restarted infusion at 0815, at this time, reserve volume on pump was 222.1 ml and volume given was 27.90. pump was observed infusing without any beeps or alarms till 0828 with a reserve volume of 220.5 ml. 1.6 ml was infused without any occlusion or concerns.   Pump clamps were taped back up and patient discharged. He knows to report back to the clinic with any concerns.   With 40 hours of infusion time left on pump, patient's new pump stop time was agreed for 12 noon on Friday 02/20/2023 after his other appointments. Patient knows to turn pump off if infusion is completed before this time.

## 2023-02-18 NOTE — Patient Instructions (Signed)

## 2023-02-18 NOTE — Telephone Encounter (Signed)
Telephone call  

## 2023-02-19 ENCOUNTER — Other Ambulatory Visit: Payer: Self-pay | Admitting: *Deleted

## 2023-02-19 ENCOUNTER — Telehealth: Payer: Self-pay | Admitting: *Deleted

## 2023-02-19 ENCOUNTER — Inpatient Hospital Stay: Payer: BC Managed Care – PPO

## 2023-02-19 ENCOUNTER — Encounter: Payer: Self-pay | Admitting: Oncology

## 2023-02-19 ENCOUNTER — Inpatient Hospital Stay: Payer: BC Managed Care – PPO | Admitting: Oncology

## 2023-02-19 VITALS — BP 129/87 | HR 66 | Temp 97.9°F | Resp 18 | Wt 207.0 lb

## 2023-02-19 VITALS — BP 129/78 | HR 88 | Temp 97.9°F | Resp 18 | Wt 207.0 lb

## 2023-02-19 DIAGNOSIS — C7951 Secondary malignant neoplasm of bone: Secondary | ICD-10-CM | POA: Diagnosis not present

## 2023-02-19 DIAGNOSIS — Z23 Encounter for immunization: Secondary | ICD-10-CM | POA: Diagnosis not present

## 2023-02-19 DIAGNOSIS — Z5189 Encounter for other specified aftercare: Secondary | ICD-10-CM | POA: Diagnosis not present

## 2023-02-19 DIAGNOSIS — C186 Malignant neoplasm of descending colon: Secondary | ICD-10-CM

## 2023-02-19 DIAGNOSIS — Z5111 Encounter for antineoplastic chemotherapy: Secondary | ICD-10-CM | POA: Diagnosis not present

## 2023-02-19 DIAGNOSIS — R809 Proteinuria, unspecified: Secondary | ICD-10-CM | POA: Diagnosis not present

## 2023-02-19 LAB — CMP (CANCER CENTER ONLY)
ALT: 12 U/L (ref 0–44)
AST: 16 U/L (ref 15–41)
Albumin: 3.4 g/dL — ABNORMAL LOW (ref 3.5–5.0)
Alkaline Phosphatase: 274 U/L — ABNORMAL HIGH (ref 38–126)
Anion gap: 12 (ref 5–15)
BUN: 26 mg/dL — ABNORMAL HIGH (ref 6–20)
CO2: 25 mmol/L (ref 22–32)
Calcium: 8.7 mg/dL — ABNORMAL LOW (ref 8.9–10.3)
Chloride: 99 mmol/L (ref 98–111)
Creatinine: 0.84 mg/dL (ref 0.61–1.24)
GFR, Estimated: >60 mL/min (ref >60)
Glucose, Bld: 249 mg/dL — ABNORMAL HIGH (ref 70–99)
Potassium: 4.3 mmol/L (ref 3.5–5.1)
Sodium: 136 mmol/L (ref 135–145)
Total Bilirubin: 0.7 mg/dL (ref <1.2)
Total Protein: 6.1 g/dL — ABNORMAL LOW (ref 6.5–8.1)

## 2023-02-19 MED ORDER — PEGFILGRASTIM INJECTION 6 MG/0.6ML ~~LOC~~
6.0000 mg | PREFILLED_SYRINGE | Freq: Once | SUBCUTANEOUS | Status: AC
  Administered 2023-02-19: 6 mg via SUBCUTANEOUS
  Filled 2023-02-19: qty 0.6

## 2023-02-19 MED ORDER — SODIUM CHLORIDE 0.9 % IV SOLN
INTRAVENOUS | Status: AC
Start: 2023-02-19 — End: 2023-02-19

## 2023-02-19 MED ORDER — SODIUM CHLORIDE 0.9% FLUSH
10.0000 mL | INTRAVENOUS | Status: DC | PRN
  Administered 2023-02-19: 10 mL

## 2023-02-19 MED ORDER — HEPARIN SOD (PORK) LOCK FLUSH 100 UNIT/ML IV SOLN
500.0000 [IU] | Freq: Once | INTRAVENOUS | Status: AC | PRN
  Administered 2023-02-19: 500 [IU]

## 2023-02-19 NOTE — Progress Notes (Signed)
Patient reported to infusion room for IVF.  Patient was able to walk on own power but was very unstable.  Patient reported feeling dizzy since after breakfast this am.  Orthostatic VS obtained and reported to Lonna Cobb, NP and Dr. Thornton Papas.  Patient take to collab side via wheelchair to be assessed by Lonna Cobb, NP and Dr. Thornton Papas.  Patient also had issues with fluorouracil pump beeping an occlusion error overnight.  Patient stated he turned pump off this am.  Pump had administered approx 74mL of of chemo.  [name] and Lonna Cobb, NP made aware.  Verbal order received to remove pump and to discard remainder of chemo.    Patient returned to infusion suite after seeing physician.  Patient was able to eat some saltine crackers and drink some gingerale without any further issues.  VSS and patient was able to ambulate around infusion room without dizziness or assistance.  [name] made aware and ok given to discharge patient home.  Patient instructed to continue oral hydration at home and to contact office if dizziness returned.  Patient verbalized understanding.

## 2023-02-19 NOTE — Telephone Encounter (Signed)
Called to report not feeling well today. Has been dizzy all morning. Ate light breakfast and has vomited x 2. Just took antiemetic. BP at home was 135/83. Having hiccup (more than his norm). His pump has been beeping "occlusion upstream" and he stopped his pump as well.  Per [name]: Bring him in for orthostatic vitals and 1 liter NS and will assess if he needs to be seen. Patient agrees to come, but is calling his wife to drive him, saying he does not feel comfortable driving.

## 2023-02-19 NOTE — Progress Notes (Signed)
[facility] OFFICE PROGRESS NOTE   Diagnosis: Colon cancer  INTERVAL HISTORY:   [name] began another cycle of FOLFIRI 02/17/2023.  He reports the 5-FU pump malfunctioned on the evening of 02/17/2023.  He was seen at the Cancer center yesterday and the pump was restarted.  He reports feeling well until after eating breakfast this morning.  He ate breakfast (cereal and V8 juice) and then while getting ready for work he noted the acute onset of nausea and dizziness.  He vomited.  He felt better when lying down, but when he got back up he again felt dizzy and vomited.  His wife drove him to the cancer center. No visual change, diarrhea, hearing loss, ear pain, or focal neurologic deficits.  No vertigo.  He continues to feel "dizzy "when ambulating to the chemotherapy room.  He now feels better.  He took Compazine at home.  Objective:  Vital signs in last 24 hours:  Blood pressure 129/78, pulse 88, temperature 97.9 F (36.6 C), temperature source Oral, resp. rate 18, weight 207 lb (93.9 kg), SpO2 100%.    HEENT: Face is symmetric Resp: Lungs clear bilaterally Cardio: Regular rate and rhythm GI: No hepatosplenomegaly Vascular: No leg edema Neuro: Alert and oriented.  The extraocular movements appear intact.  No nystagmus.  The motor exam appears intact in the upper lower extremities bilaterally.  Finger-nose testing is normal.  He was able to ambulate in the hall with a steady gait.    Portacath/PICC-without erythema  Lab Results:  Lab Results  Component Value Date   WBC 14.7 (H) 02/17/2023   HGB 11.7 (L) 02/17/2023   HCT 35.3 (L) 02/17/2023   MCV 101.1 (H) 02/17/2023   PLT 148 (L) 02/17/2023   NEUTROABS 8.4 (H) 02/17/2023    CMP  Lab Results  Component Value Date   NA 136 02/19/2023   K 4.3 02/19/2023   CL 99 02/19/2023   CO2 25 02/19/2023   GLUCOSE 249 (H) 02/19/2023   BUN 26 (H) 02/19/2023   CREATININE 0.84 02/19/2023   CALCIUM 8.7 (L) 02/19/2023    PROT 6.1 (L) 02/19/2023   ALBUMIN 3.4 (L) 02/19/2023   AST 16 02/19/2023   ALT 12 02/19/2023   ALKPHOS 274 (H) 02/19/2023   BILITOT 0.7 02/19/2023   GFRNONAA >60 02/19/2023   GFRAA 115 02/08/2020    Lab Results  Component Value Date   CEA1 2.00 07/13/2020   CEA 11.37 (H) 02/17/2023      Medications: I have reviewed the patient's current medications.   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, status post a left colectomy 03/04/2019 Mass felt to be in the transverse colon on a colonoscopy 01/07/2019 with a biopsy confirming poorly differentiated adenocarcinoma with signet ring cell features, mismatch repair protein expression normal CT abdomen/pelvis 01/08/2019 12.3 cm indeterminate splenic mass, present in 2015 on a lumbar MRI-felt to be benign, small subpleural and pleural-based nodules at the lung bases, status post left nephrectomy CT chest 01/20/2019-small bilateral pulmonary nodules with rounded parenchymal nodules in the right lower lobe, indeterminate splenic mass CT chest 03/18/2019-multiple subcentimeter pulmonary nodules again seen bilaterally, greatest in the lower lobes, without significant change.  No new or enlarging pulmonary nodules or masses.  Partially visualized large heterogeneous splenic mass with no significant change.  Plan for follow-up chest CT at a 50-month interval. Cycle 1 FOLFOX 03/21/2019 Cycle 2 FOLFOX 04/04/2019 Cycle 3 FOLFOX 04/18/2019 (  oxaliplatin dose reduced secondary to thrombocytopenia) Cycle 4 FOLFOX 05/02/2019 (oxaliplatin held secondary to thrombocytopenia) Cycle 5 FOLFOX 05/16/2019 Cycle 6 FOLFOX 05/30/2019 (oxaliplatin held secondary to thrombocytopenia) CT chest 06/08/2019-stable scattered small solid pulmonary nodules  Cycle 7 FOLFOX 06/13/2019 Cycle 8 FOLFOX 06/27/2019 Cycle 9 FOLFOX 07/11/2019 Cycle 10 FOLFOX 07/25/2019  (oxaliplatin held due to thrombocytopenia) Cycle 11 FOLFOX 08/08/2019 Cycle 12 FOLFOX 08/22/2019 CTs 01/09/2020-no evidence of recurrent disease, stable lung nodules favored to represent subpleural lymph nodes-dedicated follow-up not recommended CTs 01/11/2021-no evidence of recurrent disease CTs 01/24/2022-no evidence of recurrent disease CT lumbar spine, abdomen/pelvis 04/11/2022-numerous mixed lytic and sclerotic lesions scattered throughout the visualized axial and appendicular skeleton including several areas in the lumbar spine.   04/21/2022 CT biopsy sclerotic lesion right anterior iliac bone-consistent with metastatic colorectal adenocarcinoma, moderate to poorly differentiated.  Foundation 1-microsatellite stable, tumor mutation burden 4, K-ras G12S PET scan 05/09/2022-widespread hypermetabolic mixed faintly lytic and sclerotic osseous metastases throughout the axial and proximal appendicular skeleton; associated mild pathologic compression fracture at L3; no hypermetabolic extraosseous metastatic disease. Cycle 1 FOLFIRI/bevacizumab 05/26/2022 Cycle 2 FOLFIRI/bevacizumab 06/09/2022 Cycle 3 FOLFIRI 06/24/2022, bevacizumab 06/26/2022 Cycle 4 FOLFIRI 07/08/2022, bevacizumab held due to proteinuria Cycle 5 FOLFIRI 07/22/2022, bevacizumab held due to proteinuria CTs 07/31/2022-potentially areas of sclerosis represent interval healing; new lytic lesion at T12, multiple new areas of punched-out sclerosis throughout the visualized osseous structures.  Index lesion in the left iliac wing measures 1.3 x 2.8 cm.  Healing of previously metabolically occult metastatic disease on 05/09/2022 is a possibility. Cycle 6 FOLFIRI 08/04/2022, irinotecan dose reduced due to thrombocytopenia, bevacizumab held pending 24-hour urine result Cycle 7 FOLFIRI 08/18/2022, bevacizumab held secondary to proteinuria, G-CSF held Cycle 8 FOLFIRI 09/01/2022, bevacizumab held, G-CSF Cycle 9 FOLFIRI 09/16/2022, bevacizumab held, G-CSF CTs  09/29/2022-enlargement of a hypodense lesion posterior left lobe of the liver, similar diffuse mixed lytic and sclerotic osseous metastatic disease, multiple new small nodules medial right lung base, multiple other small bilateral pulmonary nodules unchanged. Cycle 10 FOLFIRI plus Avastin 10/08/2022 Cycle 11 FOLFIRI 10/27/2022, Avastin held Cycle 12 FOLFIRI/Avastin 11/11/2022 Cycle 13 FOLFIRI/Avastin 11/25/2022 CTs 12/08/2022-stable lesion posterior left lobe of the liver.  Unchanged diffuse lytic and sclerotic osseous metastatic disease.  Stable small solid pulmonary nodules.  Stable splenic lesions.  Slightly increased bladder wall thickening. Cycle 14 FOLFIRI/Avastin 12/10/2022 Cycle 15 FOLFIRI/Avastin 12/24/2022 12/31/2022 24-hour urine protein 710 mg Cycle 16 FOLFIRI/Avastin 01/07/2023 Cycle 17 FOLFIRI 01/21/2023, Avastin held secondary to persistent proteinuria, slight increase in creatinine, and drainage from the suprapubic lesion CTs 01/26/2023-unchanged widespread sclerotic osseous metastatic disease, unchanged hypodense liver lesions, occasional small bilateral lung nodules unchanged. 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  Left nephrectomy at age 59 12.  Port-A-Cath placement, Dr. Michaell Cowing, 03/16/2019 13.  Thrombocytopenia secondary to chemotherapy-oxaliplatin dose reduced beginning with cycle 3 FOLFOX, improved 14.  Oxaliplatin neuropathy, mild loss of  vibratory sense on exam 06/27/2019, 08/08/2019 15.  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  Right groin soft tissue infection May 2023-status postsurgical  debridement 18.  Left leg DVT-common femoral, femoral, popliteal, posterior tibial, and peroneal veins 19.  02/04/2022-MRI cervical spine-1.7 cm T1 lesion-indeterminate; bone scan 03/14/2022-focal intense activity at approximate T1 level felt to correspond to the lesion on MRI, additional foci of activity involving the lower thoracic and lumbar spine, right iliac bone and pubic symphysis.  CT lumbar spine, abdomen/pelvis 04/11/2022-numerous mixed lytic and sclerotic lesions scattered throughout the visualized axial and appendicular skeleton including several areas in the lumbar spine.  04/21/2022 CT biopsy sclerotic lesion right anterior iliac bone-consistent with metastatic colorectal adenocarcinoma, moderate to poorly differentiated 20.  Inflamed sebaceous cyst 08/18/2022-doxycycline    Disposition: [name] developed acute onset nausea/vomiting and dizziness this morning.  It is possible his symptoms are related to a delayed effect of chemotherapy, but unlikely.  He has been treated with the same chemotherapy regimen for multiple cycles without these symptoms.  He does not have symptoms of an acute infectious illness such as otitis or a sinus infection.  I have a low clinical suspicion for a CVA or CNS metastases.  Bevacizumab has been on hold for her than a month.  It is possible he had an acute and self-limited episode of vertigo.  He received intravenous fluids at the cancer center today.  Dizziness resolved.  He was able to ambulate without difficulty and take fluids by mouth.  He will be discharged to home.  He will call for recurrent symptoms.  [name] will follow-up as scheduled.  Thornton Papas, MD  02/19/2023  2:06 PM

## 2023-02-20 ENCOUNTER — Inpatient Hospital Stay: Payer: BC Managed Care – PPO

## 2023-02-20 ENCOUNTER — Ambulatory Visit (HOSPITAL_COMMUNITY): Payer: BC Managed Care – PPO | Attending: Cardiology

## 2023-02-20 DIAGNOSIS — Z9889 Other specified postprocedural states: Secondary | ICD-10-CM | POA: Diagnosis not present

## 2023-02-20 DIAGNOSIS — I10 Essential (primary) hypertension: Secondary | ICD-10-CM | POA: Diagnosis not present

## 2023-02-20 LAB — ECHOCARDIOGRAM COMPLETE
Area-P 1/2: 2.36 cm2
MV VTI: 1.12 cm2
S' Lateral: 2.9 cm

## 2023-02-24 DIAGNOSIS — C186 Malignant neoplasm of descending colon: Secondary | ICD-10-CM | POA: Diagnosis not present

## 2023-02-27 ENCOUNTER — Other Ambulatory Visit: Payer: Self-pay | Admitting: Oncology

## 2023-03-02 NOTE — Telephone Encounter (Signed)
   Primary Cardiologist: Little Ishikawa, MD  Chart reviewed as part of pre-operative protocol coverage. Given past medical history and time since last visit, based on ACC/AHA guidelines, [name] would be at acceptable risk for the planned procedure without further cardiovascular testing.   Patient was advised that if he develops new symptoms prior to surgery to contact our office to arrange a follow-up appointment.  He verbalized understanding.  Per office protocol, patient can hold Pradaxa for 2 days prior to procedure   I will route this recommendation to the requesting party via Epic fax function and remove from pre-op pool.  Please call with questions.  Levi Aland, NP-C 03/02/2023, 8:34 AM [address], [address] Office [phone] Fax [phone]

## 2023-03-03 ENCOUNTER — Inpatient Hospital Stay: Payer: BC Managed Care – PPO | Attending: Oncology

## 2023-03-03 ENCOUNTER — Inpatient Hospital Stay: Payer: BC Managed Care – PPO

## 2023-03-03 ENCOUNTER — Other Ambulatory Visit (HOSPITAL_BASED_OUTPATIENT_CLINIC_OR_DEPARTMENT_OTHER): Payer: Self-pay

## 2023-03-03 ENCOUNTER — Encounter: Payer: Self-pay | Admitting: Nurse Practitioner

## 2023-03-03 ENCOUNTER — Inpatient Hospital Stay: Payer: BC Managed Care – PPO | Admitting: Nurse Practitioner

## 2023-03-03 VITALS — BP 133/84 | HR 88 | Temp 98.2°F | Resp 18 | Ht 72.0 in | Wt 204.4 lb

## 2023-03-03 VITALS — BP 133/80 | HR 82 | Resp 18

## 2023-03-03 DIAGNOSIS — C189 Malignant neoplasm of colon, unspecified: Secondary | ICD-10-CM

## 2023-03-03 DIAGNOSIS — C787 Secondary malignant neoplasm of liver and intrahepatic bile duct: Secondary | ICD-10-CM | POA: Insufficient documentation

## 2023-03-03 DIAGNOSIS — C186 Malignant neoplasm of descending colon: Secondary | ICD-10-CM

## 2023-03-03 DIAGNOSIS — C7801 Secondary malignant neoplasm of right lung: Secondary | ICD-10-CM | POA: Insufficient documentation

## 2023-03-03 DIAGNOSIS — C7951 Secondary malignant neoplasm of bone: Secondary | ICD-10-CM | POA: Insufficient documentation

## 2023-03-03 DIAGNOSIS — Z5189 Encounter for other specified aftercare: Secondary | ICD-10-CM | POA: Insufficient documentation

## 2023-03-03 DIAGNOSIS — C184 Malignant neoplasm of transverse colon: Secondary | ICD-10-CM | POA: Diagnosis not present

## 2023-03-03 DIAGNOSIS — Z5111 Encounter for antineoplastic chemotherapy: Secondary | ICD-10-CM | POA: Insufficient documentation

## 2023-03-03 LAB — CBC WITH DIFFERENTIAL (CANCER CENTER ONLY)
Abs Immature Granulocytes: 2.1 10*3/uL — ABNORMAL HIGH (ref 0.00–0.07)
Basophils Absolute: 0.1 10*3/uL (ref 0.0–0.1)
Basophils Relative: 1 %
Eosinophils Absolute: 0.5 10*3/uL (ref 0.0–0.5)
Eosinophils Relative: 3 %
HCT: 36.5 % — ABNORMAL LOW (ref 39.0–52.0)
Hemoglobin: 11.9 g/dL — ABNORMAL LOW (ref 13.0–17.0)
Immature Granulocytes: 11 %
Lymphocytes Relative: 6 %
Lymphs Abs: 1.1 10*3/uL (ref 0.7–4.0)
MCH: 33.2 pg (ref 26.0–34.0)
MCHC: 32.6 g/dL (ref 30.0–36.0)
MCV: 102 fL — ABNORMAL HIGH (ref 80.0–100.0)
Monocytes Absolute: 1.3 10*3/uL — ABNORMAL HIGH (ref 0.1–1.0)
Monocytes Relative: 7 %
Neutro Abs: 13.6 10*3/uL — ABNORMAL HIGH (ref 1.7–7.7)
Neutrophils Relative %: 72 %
Platelet Count: 145 10*3/uL — ABNORMAL LOW (ref 150–400)
RBC: 3.58 MIL/uL — ABNORMAL LOW (ref 4.22–5.81)
RDW: 15.9 % — ABNORMAL HIGH (ref 11.5–15.5)
WBC Count: 18.7 10*3/uL — ABNORMAL HIGH (ref 4.0–10.5)
nRBC: 0.1 % (ref 0.0–0.2)

## 2023-03-03 LAB — CMP (CANCER CENTER ONLY)
ALT: 12 U/L (ref 0–44)
AST: 16 U/L (ref 15–41)
Albumin: 3.6 g/dL (ref 3.5–5.0)
Alkaline Phosphatase: 456 U/L — ABNORMAL HIGH (ref 38–126)
Anion gap: 11 (ref 5–15)
BUN: 16 mg/dL (ref 6–20)
CO2: 24 mmol/L (ref 22–32)
Calcium: 9 mg/dL (ref 8.9–10.3)
Chloride: 98 mmol/L (ref 98–111)
Creatinine: 0.98 mg/dL (ref 0.61–1.24)
GFR, Estimated: >60 mL/min (ref >60)
Glucose, Bld: 411 mg/dL — ABNORMAL HIGH (ref 70–99)
Potassium: 4.4 mmol/L (ref 3.5–5.1)
Sodium: 133 mmol/L — ABNORMAL LOW (ref 135–145)
Total Bilirubin: 0.7 mg/dL (ref <1.2)
Total Protein: 6.8 g/dL (ref 6.5–8.1)

## 2023-03-03 LAB — CEA (ACCESS): CEA (CHCC): 15.41 ng/mL — ABNORMAL HIGH (ref 0.00–5.00)

## 2023-03-03 LAB — TOTAL PROTEIN, URINE DIPSTICK: Protein, ur: 100 mg/dL — AB

## 2023-03-03 MED ORDER — ATROPINE SULFATE 1 MG/ML IV SOLN
0.5000 mg | Freq: Once | INTRAVENOUS | Status: AC | PRN
  Administered 2023-03-03: 0.5 mg via INTRAVENOUS
  Filled 2023-03-03: qty 1

## 2023-03-03 MED ORDER — DOXYCYCLINE HYCLATE 100 MG PO TABS
100.0000 mg | ORAL_TABLET | Freq: Two times a day (BID) | ORAL | 0 refills | Status: AC
  Filled 2023-03-03: qty 14, 7d supply, fill #0

## 2023-03-03 MED ORDER — SODIUM CHLORIDE 0.9 % IV SOLN
140.0000 mg/m2 | Freq: Once | INTRAVENOUS | Status: AC
  Administered 2023-03-03: 300 mg via INTRAVENOUS
  Filled 2023-03-03: qty 15

## 2023-03-03 MED ORDER — SODIUM CHLORIDE 0.9 % IV SOLN
400.0000 mg/m2 | Freq: Once | INTRAVENOUS | Status: AC
  Administered 2023-03-03: 872 mg via INTRAVENOUS
  Filled 2023-03-03: qty 43.6

## 2023-03-03 MED ORDER — SODIUM CHLORIDE 0.9 % IV SOLN: Freq: Once | INTRAVENOUS | Status: AC

## 2023-03-03 MED ORDER — SODIUM CHLORIDE 0.9 % IV SOLN
2400.0000 mg/m2 | INTRAVENOUS | Status: DC
  Administered 2023-03-03: 5000 mg via INTRAVENOUS
  Filled 2023-03-03: qty 100

## 2023-03-03 MED ORDER — FLUOROURACIL CHEMO INJECTION 2.5 GM/50ML
400.0000 mg/m2 | Freq: Once | INTRAVENOUS | Status: AC
  Administered 2023-03-03: 850 mg via INTRAVENOUS
  Filled 2023-03-03: qty 17

## 2023-03-03 MED ORDER — PALONOSETRON HCL INJECTION 0.25 MG/5ML
0.2500 mg | Freq: Once | INTRAVENOUS | Status: AC
  Administered 2023-03-03: 0.25 mg via INTRAVENOUS
  Filled 2023-03-03: qty 5

## 2023-03-03 MED ORDER — DEXAMETHASONE SODIUM PHOSPHATE 10 MG/ML IJ SOLN
10.0000 mg | Freq: Once | INTRAMUSCULAR | Status: AC
  Administered 2023-03-03: 10 mg via INTRAVENOUS
  Filled 2023-03-03: qty 1

## 2023-03-03 NOTE — Progress Notes (Signed)
[facility] OFFICE PROGRESS NOTE   Diagnosis: Colon cancer  INTERVAL HISTORY:   [name] returns as scheduled.  He completed an cycle of FOLFIRI 02/17/2023.  He was seen in the office for an unscheduled visit for evaluation of dizziness on 02/19/2023.  He received IV fluids and the dizziness resolved.  He denies further dizziness.  No vomiting.  No mouth sores.  He had a single loose stool following the most recent chemotherapy.  No unusual headaches.  No diplopia.  He reports a mild increase in generalized achy pain.  He takes 1 Aleve a day.  He is scheduled for debridement of the sebaceous cyst located at the base of the penis on 03/19/2023.  He has noted a new area of drainage.  Objective:  Vital signs in last 24 hours:  Blood pressure 133/84, pulse 88, temperature 98.2 F (36.8 C), temperature source Temporal, resp. rate 18, height 6' (1.829 m), weight 204 lb 6.4 oz (92.7 kg), SpO2 100%.    HEENT: No thrush or ulcers. Resp: Lungs clear bilaterally. Cardio: Regular rate and rhythm. GI: No hepatosplenomegaly. Vascular: No leg edema. Neuro: Alert and oriented. Skin: Palms dry appearing.  Approximate 2 cm area of cutaneous induration left suprapubic region, no drainage at present.  Surrounding skin discoloration. Port-A-Cath without erythema.  Lab Results:  Lab Results  Component Value Date   WBC 18.7 (H) 03/03/2023   HGB 11.9 (L) 03/03/2023   HCT 36.5 (L) 03/03/2023   MCV 102.0 (H) 03/03/2023   PLT 145 (L) 03/03/2023   NEUTROABS PENDING 03/03/2023    Imaging:  No results found.  Medications: I have reviewed the patient's current medications.  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, status post a left colectomy 03/04/2019 Mass felt to be in the transverse colon on a colonoscopy 01/07/2019 with a biopsy confirming poorly differentiated  adenocarcinoma with signet ring cell features, mismatch repair protein expression normal CT abdomen/pelvis 01/08/2019 12.3 cm indeterminate splenic mass, present in 2015 on a lumbar MRI-felt to be benign, small subpleural and pleural-based nodules at the lung bases, status post left nephrectomy CT chest 01/20/2019-small bilateral pulmonary nodules with rounded parenchymal nodules in the right lower lobe, indeterminate splenic mass CT chest 03/18/2019-multiple subcentimeter pulmonary nodules again seen bilaterally, greatest in the lower lobes, without significant change.  No new or enlarging pulmonary nodules or masses.  Partially visualized large heterogeneous splenic mass with no significant change.  Plan for follow-up chest CT at a 68-month interval. Cycle 1 FOLFOX 03/21/2019 Cycle 2 FOLFOX 04/04/2019 Cycle 3 FOLFOX 04/18/2019 (oxaliplatin dose reduced secondary to thrombocytopenia) Cycle 4 FOLFOX 05/02/2019 (oxaliplatin held secondary to thrombocytopenia) Cycle 5 FOLFOX 05/16/2019 Cycle 6 FOLFOX 05/30/2019 (oxaliplatin held secondary to thrombocytopenia) CT chest 06/08/2019-stable scattered small solid pulmonary nodules  Cycle 7 FOLFOX 06/13/2019 Cycle 8 FOLFOX 06/27/2019 Cycle 9 FOLFOX 07/11/2019 Cycle 10 FOLFOX 07/25/2019 (oxaliplatin held due to thrombocytopenia) Cycle 11 FOLFOX 08/08/2019 Cycle 12 FOLFOX 08/22/2019 CTs 01/09/2020-no evidence of recurrent disease, stable lung nodules favored to represent subpleural lymph nodes-dedicated follow-up not recommended CTs 01/11/2021-no evidence of recurrent disease CTs 01/24/2022-no evidence of recurrent disease CT lumbar spine, abdomen/pelvis 04/11/2022-numerous mixed lytic and sclerotic lesions scattered throughout the visualized axial and appendicular skeleton including several areas in the lumbar spine.   04/21/2022 CT biopsy sclerotic lesion right anterior iliac bone-consistent with metastatic colorectal adenocarcinoma, moderate to poorly differentiated.   Foundation 1-microsatellite stable, tumor mutation burden 4, K-ras  G12S PET scan 05/09/2022-widespread hypermetabolic mixed faintly lytic and sclerotic osseous metastases throughout the axial and proximal appendicular skeleton; associated mild pathologic compression fracture at L3; no hypermetabolic extraosseous metastatic disease. Cycle 1 FOLFIRI/bevacizumab 05/26/2022 Cycle 2 FOLFIRI/bevacizumab 06/09/2022 Cycle 3 FOLFIRI 06/24/2022, bevacizumab 06/26/2022 Cycle 4 FOLFIRI 07/08/2022, bevacizumab held due to proteinuria Cycle 5 FOLFIRI 07/22/2022, bevacizumab held due to proteinuria CTs 07/31/2022-potentially areas of sclerosis represent interval healing; new lytic lesion at T12, multiple new areas of punched-out sclerosis throughout the visualized osseous structures.  Index lesion in the left iliac wing measures 1.3 x 2.8 cm.  Healing of previously metabolically occult metastatic disease on 05/09/2022 is a possibility. Cycle 6 FOLFIRI 08/04/2022, irinotecan dose reduced due to thrombocytopenia, bevacizumab held pending 24-hour urine result Cycle 7 FOLFIRI 08/18/2022, bevacizumab held secondary to proteinuria, G-CSF held Cycle 8 FOLFIRI 09/01/2022, bevacizumab held, G-CSF Cycle 9 FOLFIRI 09/16/2022, bevacizumab held, G-CSF CTs 09/29/2022-enlargement of a hypodense lesion posterior left lobe of the liver, similar diffuse mixed lytic and sclerotic osseous metastatic disease, multiple new small nodules medial right lung base, multiple other small bilateral pulmonary nodules unchanged. Cycle 10 FOLFIRI plus Avastin 10/08/2022 Cycle 11 FOLFIRI 10/27/2022, Avastin held Cycle 12 FOLFIRI/Avastin 11/11/2022 Cycle 13 FOLFIRI/Avastin 11/25/2022 CTs 12/08/2022-stable lesion posterior left lobe of the liver.  Unchanged diffuse lytic and sclerotic osseous metastatic disease.  Stable small solid pulmonary nodules.  Stable splenic lesions.  Slightly increased bladder wall thickening. Cycle 14 FOLFIRI/Avastin 12/10/2022 Cycle 15  FOLFIRI/Avastin 12/24/2022 12/31/2022 24-hour urine protein 710 mg Cycle 16 FOLFIRI/Avastin 01/07/2023 Cycle 17 FOLFIRI 01/21/2023, Avastin held secondary to persistent proteinuria, slight increase in creatinine, and drainage from the suprapubic lesion CTs 01/26/2023-unchanged widespread sclerotic osseous metastatic disease, unchanged hypodense liver lesions, occasional small bilateral lung nodules unchanged. 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  Left nephrectomy at age 57 12.  Port-A-Cath placement, Dr. Michaell Cowing, 03/16/2019 13.  Thrombocytopenia secondary to chemotherapy-oxaliplatin dose reduced beginning with cycle 3 FOLFOX, improved 14.  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  Right groin soft tissue infection May 2023-status postsurgical debridement 18.  Left leg DVT-common femoral, femoral, popliteal, posterior tibial, and peroneal veins 19.  02/04/2022-MRI cervical spine-1.7 cm T1 lesion-indeterminate; bone scan 03/14/2022-focal intense activity at approximate T1 level felt to correspond to the lesion on MRI, additional foci of activity involving the lower thoracic and lumbar spine, right iliac bone and pubic symphysis.  CT  lumbar spine, abdomen/pelvis 04/11/2022-numerous mixed lytic and sclerotic lesions scattered throughout the visualized axial and appendicular skeleton including several areas in the lumbar spine.  04/21/2022 CT biopsy sclerotic lesion right anterior iliac bone-consistent with metastatic colorectal adenocarcinoma, moderate to poorly differentiated 20.  Inflamed sebaceous cyst 08/18/2022-doxycycline    Disposition: [name] appears stable.  He completed cycle 9 FOLFIRI 2 weeks ago.  He developed dizziness on the day of pump discontinuation.  He received IV fluids and the dizziness resolved.  He  has had no further similar episodes.  Plan to proceed with cycle 10 FOLFIRI today as scheduled.  Continue to hold Avastin.  CBC and chemistry panel reviewed.  Labs adequate to proceed as above.  We discussed the markedly elevated blood glucose of 411.  He will avoid concentrated sweets and monitor his blood sugar closely over the next several days.  We will check a CBG when he returns for pump discontinuation in 2 days.  He will return for follow-up and treatment in 2 weeks.  We are available to see him sooner if needed.   Lonna Cobb ANP/GNP-BC   03/03/2023  11:32 AM

## 2023-03-03 NOTE — Progress Notes (Signed)
Patient seen by Lonna Cobb NP today  Vitals are within treatment parameters:Yes   Labs are within treatment parameters: No (Please specify and give further instructions.) protein 100  Treatment plan has been signed: Yes   Per physician team, Patient is ready for treatment. Please note the following modifications:no avastin and the patient needs his CBG check when his pump come off.

## 2023-03-03 NOTE — Patient Instructions (Signed)
[facility]   Discharge Instructions: Thank you for choosing [name] to provide your oncology and hematology care.   If you have a lab appointment with the [name], please go directly to the [name] and check in at the registration area.   Wear comfortable clothing and clothing appropriate for easy access to any Portacath or PICC line.   We strive to give you quality time with your provider. You may need to reschedule your appointment if you arrive late (15 or more minutes).  Arriving late affects you and other patients whose appointments are after yours.  Also, if you miss three or more appointments without notifying the office, you may be dismissed from the clinic at the provider's discretion.      For prescription refill requests, have your pharmacy contact our office and allow 72 hours for refills to be completed.    Today you received the following chemotherapy and/or immunotherapy agents Irinotecan (CAMPTOSAR), Leucovorin & Flourouracil (ADRUCIL).      To help prevent nausea and vomiting after your treatment, we encourage you to take your nausea medication as directed.  BELOW ARE SYMPTOMS THAT SHOULD BE REPORTED IMMEDIATELY: *FEVER GREATER THAN 100.4 F (38 C) OR HIGHER *CHILLS OR SWEATING *NAUSEA AND VOMITING THAT IS NOT CONTROLLED WITH YOUR NAUSEA MEDICATION *UNUSUAL SHORTNESS OF BREATH *UNUSUAL BRUISING OR BLEEDING *URINARY PROBLEMS (pain or burning when urinating, or frequent urination) *BOWEL PROBLEMS (unusual diarrhea, constipation, pain near the anus) TENDERNESS IN MOUTH AND THROAT WITH OR WITHOUT PRESENCE OF ULCERS (sore throat, sores in mouth, or a toothache) UNUSUAL RASH, SWELLING OR PAIN  UNUSUAL VAGINAL DISCHARGE OR ITCHING   Items with * indicate a potential emergency and should be followed up as soon as possible or go to the Emergency Department if any problems should  occur.  Please show the CHEMOTHERAPY ALERT CARD or IMMUNOTHERAPY ALERT CARD at check-in to the Emergency Department and triage nurse.  Should you have questions after your visit or need to cancel or reschedule your appointment, please contact [facility]  Dept: [phone]  and follow the prompts.  Office hours are 8:00 a.m. to 4:30 p.m. Monday - Friday. Please note that voicemails left after 4:00 p.m. may not be returned until the following business day.  We are closed weekends and major holidays. You have access to a nurse at all times for urgent questions. Please call the main number to the clinic Dept: [phone] and follow the prompts.   For any non-urgent questions, you may also contact your provider using MyChart. We now offer e-Visits for anyone 74 and older to request care online for non-urgent symptoms. For details visit [email or web address].   Also download the MyChart app! Go to the app store, search "MyChart", open the app, select [facility], and log in with your MyChart username and password.  Irinotecan Injection What is this medication? IRINOTECAN (ir in oh TEE kan) treats some types of cancer. It works by slowing down the growth of cancer cells. This medicine may be used for other purposes; ask your health care provider or pharmacist if you have questions. COMMON BRAND NAME(S): Camptosar What should I tell my care team before I take this medication? They need to know if you have any of these conditions: Dehydration Diarrhea Infection, especially a viral infection, such as chickenpox, cold sores, herpes Liver disease Low blood  cell levels (white cells, red cells, and platelets) Low levels of electrolytes, such as calcium, magnesium, or potassium in your blood Recent or ongoing radiation An unusual or allergic reaction to irinotecan, other medications, foods, dyes, or preservatives If you or your partner are  pregnant or trying to get pregnant Breast-feeding How should I use this medication? This medication is injected into a vein. It is given by your care team in a hospital or clinic setting. Talk to your care team about the use of this medication in children. Special care may be needed. Overdosage: If you think you have taken too much of this medicine contact a poison control [name] or emergency room at once. NOTE: This medicine is only for you. Do not share this medicine with others. What if I miss a dose? Keep appointments for follow-up doses. It is important not to miss your dose. Call your care team if you are unable to keep an appointment. What may interact with this medication? Do not take this medication with any of the following: Cobicistat Itraconazole This medication may also interact with the following: Certain antibiotics, such as clarithromycin, rifampin, rifabutin Certain antivirals for HIV or AIDS Certain medications for fungal infections, such as ketoconazole, posaconazole, voriconazole Certain medications for seizures, such as carbamazepine, phenobarbital, phenytoin Gemfibrozil Nefazodone St. John's wort This list may not describe all possible interactions. Give your health care provider a list of all the medicines, herbs, non-prescription drugs, or dietary supplements you use. Also tell them if you smoke, drink alcohol, or use illegal drugs. Some items may interact with your medicine. What should I watch for while using this medication? Your condition will be monitored carefully while you are receiving this medication. You may need blood work while taking this medication. This medication may make you feel generally unwell. This is not uncommon as chemotherapy can affect healthy cells as well as cancer cells. Report any side effects. Continue your course of treatment even though you feel ill unless your care team tells you to stop. This medication can cause serious side  effects. To reduce the risk, your care team may give you other medications to take before receiving this one. Be sure to follow the directions from your care team. This medication may affect your coordination, reaction time, or judgement. Do not drive or operate machinery until you know how this medication affects you. Sit up or stand slowly to reduce the risk of dizzy or fainting spells. Drinking alcohol with this medication can increase the risk of these side effects. This medication may increase your risk of getting an infection. Call your care team for advice if you get a fever, chills, sore throat, or other symptoms of a cold or flu. Do not treat yourself. Try to avoid being around people who are sick. Avoid taking medications that contain aspirin, acetaminophen, ibuprofen, naproxen, or ketoprofen unless instructed by your care team. These medications may hide a fever. This medication may increase your risk to bruise or bleed. Call your care team if you notice any unusual bleeding. Be careful brushing or flossing your teeth or using a toothpick because you may get an infection or bleed more easily. If you have any dental work done, tell your dentist you are receiving this medication. Talk to your care team if you or your partner are pregnant or think either of you might be pregnant. This medication can cause serious birth defects if taken during pregnancy and for 6 months after the last dose. You will need  a negative pregnancy test before starting this medication. Contraception is recommended while taking this medication and for 6 months after the last dose. Your care team can help you find the option that works for you. Do not father a child while taking this medication and for 3 months after the last dose. Use a condom for contraception during this time period. Do not breastfeed while taking this medication and for 7 days after the last dose. This medication may cause infertility. Talk to your care  team if you are concerned about your fertility. What side effects may I notice from receiving this medication? Side effects that you should report to your care team as soon as possible: Allergic reactions--skin rash, itching, hives, swelling of the face, lips, tongue, or throat Dry cough, shortness of breath or trouble breathing Increased saliva or tears, increased sweating, stomach cramping, diarrhea, small pupils, unusual weakness or fatigue, slow heartbeat Infection--fever, chills, cough, sore throat, wounds that don't heal, pain or trouble when passing urine, general feeling of discomfort or being unwell Kidney injury--decrease in the amount of urine, swelling of the ankles, hands, or feet Low red blood cell level--unusual weakness or fatigue, dizziness, headache, trouble breathing Severe or prolonged diarrhea Unusual bruising or bleeding Side effects that usually do not require medical attention (report to your care team if they continue or are bothersome): Constipation Diarrhea Hair loss Loss of appetite Nausea Stomach pain This list may not describe all possible side effects. Call your doctor for medical advice about side effects. You may report side effects to FDA at 1-800-FDA-1088. Where should I keep my medication? This medication is given in a hospital or clinic. It will not be stored at home. NOTE: This sheet is a summary. It may not cover all possible information. If you have questions about this medicine, talk to your doctor, pharmacist, or health care provider.  2024 Elsevier/Gold Standard (2021-07-29 00:00:00)   Leucovorin Injection What is this medication? LEUCOVORIN (loo koe VOR in) prevents side effects from certain medications, such as methotrexate. It works by increasing folate levels. This helps protect healthy cells in your body. It may also be used to treat anemia caused by low levels of folate. It can also be used with fluorouracil, a type of chemotherapy, to  treat colorectal cancer. It works by increasing the effects of fluorouracil in the body. This medicine may be used for other purposes; ask your health care provider or pharmacist if you have questions. What should I tell my care team before I take this medication? They need to know if you have any of these conditions: Anemia from low levels of vitamin B12 in the blood An unusual or allergic reaction to leucovorin, folic acid, other medications, foods, dyes, or preservatives Pregnant or trying to get pregnant Breastfeeding How should I use this medication? This medication is injected into a vein or a muscle. It is given by your care team in a hospital or clinic setting. Talk to your care team about the use of this medication in children. Special care may be needed. Overdosage: If you think you have taken too much of this medicine contact a poison control [name] or emergency room at once. NOTE: This medicine is only for you. Do not share this medicine with others. What if I miss a dose? Keep appointments for follow-up doses. It is important not to miss your dose. Call your care team if you are unable to keep an appointment. What may interact with this medication? Capecitabine Fluorouracil  Phenobarbital Phenytoin Primidone Trimethoprim;sulfamethoxazole This list may not describe all possible interactions. Give your health care provider a list of all the medicines, herbs, non-prescription drugs, or dietary supplements you use. Also tell them if you smoke, drink alcohol, or use illegal drugs. Some items may interact with your medicine. What should I watch for while using this medication? Your condition will be monitored carefully while you are receiving this medication. This medication may increase the side effects of 5-fluorouracil. Tell your care team if you have diarrhea or mouth sores that do not get better or that get worse. What side effects may I notice from receiving this  medication? Side effects that you should report to your care team as soon as possible: Allergic reactions--skin rash, itching, hives, swelling of the face, lips, tongue, or throat This list may not describe all possible side effects. Call your doctor for medical advice about side effects. You may report side effects to FDA at 1-800-FDA-1088. Where should I keep my medication? This medication is given in a hospital or clinic. It will not be stored at home. NOTE: This sheet is a summary. It may not cover all possible information. If you have questions about this medicine, talk to your doctor, pharmacist, or health care provider.  2024 Elsevier/Gold Standard (2021-08-20 00:00:00)   Fluorouracil Injection What is this medication? FLUOROURACIL (flure oh YOOR a sil) treats some types of cancer. It works by slowing down the growth of cancer cells. This medicine may be used for other purposes; ask your health care provider or pharmacist if you have questions. COMMON BRAND NAME(S): Adrucil What should I tell my care team before I take this medication? They need to know if you have any of these conditions: Blood disorders Dihydropyrimidine dehydrogenase (DPD) deficiency Infection, such as chickenpox, cold sores, herpes Kidney disease Liver disease Poor nutrition Recent or ongoing radiation therapy An unusual or allergic reaction to fluorouracil, other medications, foods, dyes, or preservatives If you or your partner are pregnant or trying to get pregnant Breast-feeding How should I use this medication? This medication is injected into a vein. It is administered by your care team in a hospital or clinic setting. Talk to your care team about the use of this medication in children. Special care may be needed. Overdosage: If you think you have taken too much of this medicine contact a poison control [name] or emergency room at once. NOTE: This medicine is only for you. Do not share this medicine  with others. What if I miss a dose? Keep appointments for follow-up doses. It is important not to miss your dose. Call your care team if you are unable to keep an appointment. What may interact with this medication? Do not take this medication with any of the following: Live virus vaccines This medication may also interact with the following: Medications that treat or prevent blood clots, such as warfarin, enoxaparin, dalteparin This list may not describe all possible interactions. Give your health care provider a list of all the medicines, herbs, non-prescription drugs, or dietary supplements you use. Also tell them if you smoke, drink alcohol, or use illegal drugs. Some items may interact with your medicine. What should I watch for while using this medication? Your condition will be monitored carefully while you are receiving this medication. This medication may make you feel generally unwell. This is not uncommon as chemotherapy can affect healthy cells as well as cancer cells. Report any side effects. Continue your course of treatment even though you  feel ill unless your care team tells you to stop. In some cases, you may be given additional medications to help with side effects. Follow all directions for their use. This medication may increase your risk of getting an infection. Call your care team for advice if you get a fever, chills, sore throat, or other symptoms of a cold or flu. Do not treat yourself. Try to avoid being around people who are sick. This medication may increase your risk to bruise or bleed. Call your care team if you notice any unusual bleeding. Be careful brushing or flossing your teeth or using a toothpick because you may get an infection or bleed more easily. If you have any dental work done, tell your dentist you are receiving this medication. Avoid taking medications that contain aspirin, acetaminophen, ibuprofen, naproxen, or ketoprofen unless instructed by your care  team. These medications may hide a fever. Do not treat diarrhea with over the counter products. Contact your care team if you have diarrhea that lasts more than 2 days or if it is severe and watery. This medication can make you more sensitive to the sun. Keep out of the sun. If you cannot avoid being in the sun, wear protective clothing and sunscreen. Do not use sun lamps, tanning beds, or tanning booths. Talk to your care team if you or your partner wish to become pregnant or think you might be pregnant. This medication can cause serious birth defects if taken during pregnancy and for 3 months after the last dose. A reliable form of contraception is recommended while taking this medication and for 3 months after the last dose. Talk to your care team about effective forms of contraception. Do not father a child while taking this medication and for 3 months after the last dose. Use a condom while having sex during this time period. Do not breastfeed while taking this medication. This medication may cause infertility. Talk to your care team if you are concerned about your fertility. What side effects may I notice from receiving this medication? Side effects that you should report to your care team as soon as possible: Allergic reactions--skin rash, itching, hives, swelling of the face, lips, tongue, or throat Heart attack--pain or tightness in the chest, shoulders, arms, or jaw, nausea, shortness of breath, cold or clammy skin, feeling faint or lightheaded Heart failure--shortness of breath, swelling of the ankles, feet, or hands, sudden weight gain, unusual weakness or fatigue Heart rhythm changes--fast or irregular heartbeat, dizziness, feeling faint or lightheaded, chest pain, trouble breathing High ammonia level--unusual weakness or fatigue, confusion, loss of appetite, nausea, vomiting, seizures Infection--fever, chills, cough, sore throat, wounds that don't heal, pain or trouble when passing urine,  general feeling of discomfort or being unwell Low red blood cell level--unusual weakness or fatigue, dizziness, headache, trouble breathing Pain, tingling, or numbness in the hands or feet, muscle weakness, change in vision, confusion or trouble speaking, loss of balance or coordination, trouble walking, seizures Redness, swelling, and blistering of the skin over hands and feet Severe or prolonged diarrhea Unusual bruising or bleeding Side effects that usually do not require medical attention (report to your care team if they continue or are bothersome): Dry skin Headache Increased tears Nausea Pain, redness, or swelling with sores inside the mouth or throat Sensitivity to light Vomiting This list may not describe all possible side effects. Call your doctor for medical advice about side effects. You may report side effects to FDA at 1-800-FDA-1088. Where should I keep  my medication? This medication is given in a hospital or clinic. It will not be stored at home. NOTE: This sheet is a summary. It may not cover all possible information. If you have questions about this medicine, talk to your doctor, pharmacist, or health care provider.  2024 Elsevier/Gold Standard (2021-07-23 00:00:00)   The chemotherapy medication bag should finish at 46 hours, 96 hours, or 7 days. For example, if your pump is scheduled for 46 hours and it was put on at 4:00 p.m., it should finish at 2:00 p.m. the day it is scheduled to come off regardless of your appointment time.     Estimated time to finish at 1:30 p.m. on Thursday 03/05/2023.   If the display on your pump reads "Low Volume" and it is beeping, take the batteries out of the pump and come to the [name] for it to be taken off.   If the pump alarms go off prior to the pump reading "Low Volume" then call [phone] and someone can assist you.  If the plunger comes out and the chemotherapy medication is leaking out, please use your home chemo  spill kit to clean up the spill. Do NOT use paper towels or other household products.  If you have problems or questions regarding your pump, please call either [phone] (24 hours a day) or the [name] Monday-Friday 8:00 a.m.- 4:30 p.m. at the clinic number and we will assist you. If you are unable to get assistance, then go to the nearest Emergency Department and ask the staff to contact the IV team for assistance.

## 2023-03-04 ENCOUNTER — Other Ambulatory Visit: Payer: Self-pay

## 2023-03-05 ENCOUNTER — Inpatient Hospital Stay: Payer: BC Managed Care – PPO

## 2023-03-05 VITALS — BP 122/83 | HR 85 | Temp 98.2°F | Resp 185

## 2023-03-05 DIAGNOSIS — Z5189 Encounter for other specified aftercare: Secondary | ICD-10-CM | POA: Diagnosis not present

## 2023-03-05 DIAGNOSIS — C184 Malignant neoplasm of transverse colon: Secondary | ICD-10-CM | POA: Diagnosis not present

## 2023-03-05 DIAGNOSIS — C186 Malignant neoplasm of descending colon: Secondary | ICD-10-CM

## 2023-03-05 DIAGNOSIS — C787 Secondary malignant neoplasm of liver and intrahepatic bile duct: Secondary | ICD-10-CM | POA: Diagnosis not present

## 2023-03-05 DIAGNOSIS — C7801 Secondary malignant neoplasm of right lung: Secondary | ICD-10-CM | POA: Diagnosis not present

## 2023-03-05 DIAGNOSIS — C7951 Secondary malignant neoplasm of bone: Secondary | ICD-10-CM | POA: Diagnosis not present

## 2023-03-05 DIAGNOSIS — Z5111 Encounter for antineoplastic chemotherapy: Secondary | ICD-10-CM | POA: Diagnosis not present

## 2023-03-05 MED ORDER — PEGFILGRASTIM INJECTION 6 MG/0.6ML ~~LOC~~
6.0000 mg | PREFILLED_SYRINGE | Freq: Once | SUBCUTANEOUS | Status: AC
  Administered 2023-03-05: 6 mg via SUBCUTANEOUS
  Filled 2023-03-05: qty 0.6

## 2023-03-05 MED ORDER — SODIUM CHLORIDE 0.9% FLUSH
10.0000 mL | INTRAVENOUS | Status: DC | PRN
  Administered 2023-03-05: 10 mL

## 2023-03-05 MED ORDER — HEPARIN SOD (PORK) LOCK FLUSH 100 UNIT/ML IV SOLN
500.0000 [IU] | Freq: Once | INTRAVENOUS | Status: AC | PRN
  Administered 2023-03-05: 500 [IU]

## 2023-03-05 NOTE — Patient Instructions (Signed)

## 2023-03-05 NOTE — Progress Notes (Signed)
Patient's blood sugar checked per physician order today.  BG was [phone] and Lonna Cobb, NP made aware.  Per Lonna Cobb, NP please have patient "contact PCP for recommendations regarding sliding scale insulin".  Patient notified of Lonna Cobb, NP response and verbalized understanding.

## 2023-03-08 ENCOUNTER — Other Ambulatory Visit: Payer: Self-pay | Admitting: Oncology

## 2023-03-08 ENCOUNTER — Other Ambulatory Visit: Payer: Self-pay | Admitting: Nurse Practitioner

## 2023-03-08 DIAGNOSIS — C186 Malignant neoplasm of descending colon: Secondary | ICD-10-CM

## 2023-03-08 DIAGNOSIS — C189 Malignant neoplasm of colon, unspecified: Secondary | ICD-10-CM

## 2023-03-09 ENCOUNTER — Other Ambulatory Visit: Payer: Self-pay | Admitting: Oncology

## 2023-03-10 ENCOUNTER — Encounter: Payer: Self-pay | Admitting: Oncology

## 2023-03-10 MED ORDER — TRAMADOL HCL 50 MG PO TABS: 50.0000 mg | ORAL_TABLET | Freq: Four times a day (QID) | ORAL | 0 refills | Status: DC | PRN

## 2023-03-15 ENCOUNTER — Other Ambulatory Visit: Payer: Self-pay | Admitting: Oncology

## 2023-03-15 DIAGNOSIS — C186 Malignant neoplasm of descending colon: Secondary | ICD-10-CM

## 2023-03-16 ENCOUNTER — Inpatient Hospital Stay: Payer: BC Managed Care – PPO | Admitting: Nurse Practitioner

## 2023-03-16 ENCOUNTER — Inpatient Hospital Stay: Payer: BC Managed Care – PPO

## 2023-03-16 ENCOUNTER — Encounter (HOSPITAL_BASED_OUTPATIENT_CLINIC_OR_DEPARTMENT_OTHER): Payer: Self-pay | Admitting: General Surgery

## 2023-03-16 ENCOUNTER — Encounter: Payer: Self-pay | Admitting: Nurse Practitioner

## 2023-03-16 VITALS — BP 123/83 | HR 87 | Temp 98.1°F | Resp 18 | Ht 72.0 in | Wt 204.0 lb

## 2023-03-16 DIAGNOSIS — C7951 Secondary malignant neoplasm of bone: Secondary | ICD-10-CM

## 2023-03-16 DIAGNOSIS — C186 Malignant neoplasm of descending colon: Secondary | ICD-10-CM

## 2023-03-16 DIAGNOSIS — C184 Malignant neoplasm of transverse colon: Secondary | ICD-10-CM | POA: Diagnosis not present

## 2023-03-16 DIAGNOSIS — Z5189 Encounter for other specified aftercare: Secondary | ICD-10-CM | POA: Diagnosis not present

## 2023-03-16 DIAGNOSIS — C189 Malignant neoplasm of colon, unspecified: Secondary | ICD-10-CM | POA: Diagnosis not present

## 2023-03-16 DIAGNOSIS — Z5111 Encounter for antineoplastic chemotherapy: Secondary | ICD-10-CM | POA: Diagnosis not present

## 2023-03-16 DIAGNOSIS — C7801 Secondary malignant neoplasm of right lung: Secondary | ICD-10-CM | POA: Diagnosis not present

## 2023-03-16 DIAGNOSIS — C787 Secondary malignant neoplasm of liver and intrahepatic bile duct: Secondary | ICD-10-CM | POA: Diagnosis not present

## 2023-03-16 LAB — CBC WITH DIFFERENTIAL (CANCER CENTER ONLY)
Abs Immature Granulocytes: 1.48 10*3/uL — ABNORMAL HIGH (ref 0.00–0.07)
Basophils Absolute: 0.1 10*3/uL (ref 0.0–0.1)
Basophils Relative: 1 %
Eosinophils Absolute: 0.8 10*3/uL — ABNORMAL HIGH (ref 0.0–0.5)
Eosinophils Relative: 6 %
HCT: 34.7 % — ABNORMAL LOW (ref 39.0–52.0)
Hemoglobin: 11.3 g/dL — ABNORMAL LOW (ref 13.0–17.0)
Immature Granulocytes: 11 %
Lymphocytes Relative: 6 %
Lymphs Abs: 0.8 10*3/uL (ref 0.7–4.0)
MCH: 33.1 pg (ref 26.0–34.0)
MCHC: 32.6 g/dL (ref 30.0–36.0)
MCV: 101.8 fL — ABNORMAL HIGH (ref 80.0–100.0)
Monocytes Absolute: 1.3 10*3/uL — ABNORMAL HIGH (ref 0.1–1.0)
Monocytes Relative: 10 %
Neutro Abs: 8.5 10*3/uL — ABNORMAL HIGH (ref 1.7–7.7)
Neutrophils Relative %: 66 %
Platelet Count: 120 10*3/uL — ABNORMAL LOW (ref 150–400)
RBC: 3.41 MIL/uL — ABNORMAL LOW (ref 4.22–5.81)
RDW: 16.1 % — ABNORMAL HIGH (ref 11.5–15.5)
WBC Count: 13.1 10*3/uL — ABNORMAL HIGH (ref 4.0–10.5)
nRBC: 0.4 % — ABNORMAL HIGH (ref 0.0–0.2)

## 2023-03-16 LAB — CMP (CANCER CENTER ONLY)
ALT: 9 U/L (ref 0–44)
AST: 17 U/L (ref 15–41)
Albumin: 3.3 g/dL — ABNORMAL LOW (ref 3.5–5.0)
Alkaline Phosphatase: 395 U/L — ABNORMAL HIGH (ref 38–126)
Anion gap: 7 (ref 5–15)
BUN: 15 mg/dL (ref 6–20)
CO2: 26 mmol/L (ref 22–32)
Calcium: 8.3 mg/dL — ABNORMAL LOW (ref 8.9–10.3)
Chloride: 100 mmol/L (ref 98–111)
Creatinine: 0.96 mg/dL (ref 0.61–1.24)
GFR, Estimated: >60 mL/min (ref >60)
Glucose, Bld: 304 mg/dL — ABNORMAL HIGH (ref 70–99)
Potassium: 4.4 mmol/L (ref 3.5–5.1)
Sodium: 133 mmol/L — ABNORMAL LOW (ref 135–145)
Total Bilirubin: 0.6 mg/dL (ref <1.2)
Total Protein: 6.1 g/dL — ABNORMAL LOW (ref 6.5–8.1)

## 2023-03-16 LAB — TOTAL PROTEIN, URINE DIPSTICK: Protein, ur: 30 mg/dL — AB

## 2023-03-16 LAB — CEA (ACCESS): CEA (CHCC): 12.98 ng/mL — ABNORMAL HIGH (ref 0.00–5.00)

## 2023-03-16 MED ORDER — SODIUM CHLORIDE 0.9 % IV SOLN: Freq: Once | INTRAVENOUS | Status: AC

## 2023-03-16 MED ORDER — DEXAMETHASONE SODIUM PHOSPHATE 10 MG/ML IJ SOLN
10.0000 mg | Freq: Once | INTRAMUSCULAR | Status: AC
  Administered 2023-03-16: 10 mg via INTRAVENOUS
  Filled 2023-03-16: qty 1

## 2023-03-16 MED ORDER — PALONOSETRON HCL INJECTION 0.25 MG/5ML
0.2500 mg | Freq: Once | INTRAVENOUS | Status: AC
  Administered 2023-03-16: 0.25 mg via INTRAVENOUS
  Filled 2023-03-16: qty 5

## 2023-03-16 MED ORDER — HYDROCODONE-ACETAMINOPHEN 5-325 MG PO TABS: 1.0000 | ORAL_TABLET | Freq: Four times a day (QID) | ORAL | 0 refills | Status: DC | PRN

## 2023-03-16 MED ORDER — SODIUM CHLORIDE 0.9 % IV SOLN
140.0000 mg/m2 | Freq: Once | INTRAVENOUS | Status: AC
  Administered 2023-03-16: 300 mg via INTRAVENOUS
  Filled 2023-03-16: qty 15

## 2023-03-16 MED ORDER — ATROPINE SULFATE 1 MG/ML IV SOLN
0.5000 mg | Freq: Once | INTRAVENOUS | Status: AC | PRN
  Administered 2023-03-16: 0.5 mg via INTRAVENOUS
  Filled 2023-03-16: qty 1

## 2023-03-16 MED ORDER — FLUOROURACIL CHEMO INJECTION 2.5 GM/50ML
400.0000 mg/m2 | Freq: Once | INTRAVENOUS | Status: AC
  Administered 2023-03-16: 850 mg via INTRAVENOUS
  Filled 2023-03-16: qty 17

## 2023-03-16 MED ORDER — SODIUM CHLORIDE 0.9 % IV SOLN
400.0000 mg/m2 | Freq: Once | INTRAVENOUS | Status: DC
  Filled 2023-03-16: qty 43.6

## 2023-03-16 MED ORDER — SODIUM CHLORIDE 0.9 % IV SOLN
2400.0000 mg/m2 | INTRAVENOUS | Status: DC
  Administered 2023-03-16: 5000 mg via INTRAVENOUS
  Filled 2023-03-16: qty 100

## 2023-03-16 MED ORDER — SODIUM CHLORIDE 0.9 % IV SOLN
400.0000 mg/m2 | Freq: Once | INTRAVENOUS | Status: AC
  Administered 2023-03-16: 872 mg via INTRAVENOUS
  Filled 2023-03-16: qty 43.6

## 2023-03-16 NOTE — Progress Notes (Addendum)
Patient seen by Lonna Cobb NP today  Vitals are within treatment parameters:Yes   Labs are within treatment parameters: Yes Protein urine 30.  Treatment plan has been signed: Yes   Per physician team, Patient is ready for treatment and there are NO modifications to the treatment plan. continue to hold Avastin

## 2023-03-16 NOTE — Patient Instructions (Signed)
[facility]   Discharge Instructions: Thank you for choosing [facility] to provide your oncology and hematology care.   If you have a lab appointment with the Cancer Center, please go directly to the Cancer Center and check in at the registration area.   Wear comfortable clothing and clothing appropriate for easy access to any Portacath or PICC line.   We strive to give you quality time with your provider. You may need to reschedule your appointment if you arrive late (15 or more minutes).  Arriving late affects you and other patients whose appointments are after yours.  Also, if you miss three or more appointments without notifying the office, you may be dismissed from the clinic at the provider's discretion.      For prescription refill requests, have your pharmacy contact our office and allow 72 hours for refills to be completed.    Today you received the following chemotherapy and/or immunotherapy agents Irinotecan (CAMPTOSAR), Leucovorin & Flourouracil (ADRUCIL).      To help prevent nausea and vomiting after your treatment, we encourage you to take your nausea medication as directed.  BELOW ARE SYMPTOMS THAT SHOULD BE REPORTED IMMEDIATELY: *FEVER GREATER THAN 100.4 F (38 C) OR HIGHER *CHILLS OR SWEATING *NAUSEA AND VOMITING THAT IS NOT CONTROLLED WITH YOUR NAUSEA MEDICATION *UNUSUAL SHORTNESS OF BREATH *UNUSUAL BRUISING OR BLEEDING *URINARY PROBLEMS (pain or burning when urinating, or frequent urination) *BOWEL PROBLEMS (unusual diarrhea, constipation, pain near the anus) TENDERNESS IN MOUTH AND THROAT WITH OR WITHOUT PRESENCE OF ULCERS (sore throat, sores in mouth, or a toothache) UNUSUAL RASH, SWELLING OR PAIN  UNUSUAL VAGINAL DISCHARGE OR ITCHING   Items with * indicate a potential emergency and should be followed up as soon as possible or go to the Emergency Department if any problems should  occur.  Please show the CHEMOTHERAPY ALERT CARD or IMMUNOTHERAPY ALERT CARD at check-in to the Emergency Department and triage nurse.  Should you have questions after your visit or need to cancel or reschedule your appointment, please contact [facility]  Dept: [phone]  and follow the prompts.  Office hours are 8:00 a.m. to 4:30 p.m. Monday - Friday. Please note that voicemails left after 4:00 p.m. may not be returned until the following business day.  We are closed weekends and major holidays. You have access to a nurse at all times for urgent questions. Please call the main number to the clinic Dept: [phone] and follow the prompts.   For any non-urgent questions, you may also contact your provider using MyChart. We now offer e-Visits for anyone 23 and older to request care online for non-urgent symptoms. For details visit [email or web address].   Also download the MyChart app! Go to the app store, search "MyChart", open the app, select [place], and log in with your MyChart username and password.  Irinotecan Injection What is this medication? IRINOTECAN (ir in oh TEE kan) treats some types of cancer. It works by slowing down the growth of cancer cells. This medicine may be used for other purposes; ask your health care provider or pharmacist if you have questions. COMMON BRAND NAME(S): Camptosar What should I tell my care team before I take this medication? They need to know if you have any of these conditions: Dehydration Diarrhea Infection, especially a viral infection, such as chickenpox, cold sores, herpes Liver disease Low blood  cell levels (white cells, red cells, and platelets) Low levels of electrolytes, such as calcium, magnesium, or potassium in your blood Recent or ongoing radiation An unusual or allergic reaction to irinotecan, other medications, foods, dyes, or preservatives If you or your partner are  pregnant or trying to get pregnant Breast-feeding How should I use this medication? This medication is injected into a vein. It is given by your care team in a hospital or clinic setting. Talk to your care team about the use of this medication in children. Special care may be needed. Overdosage: If you think you have taken too much of this medicine contact a poison control center or emergency room at once. NOTE: This medicine is only for you. Do not share this medicine with others. What if I miss a dose? Keep appointments for follow-up doses. It is important not to miss your dose. Call your care team if you are unable to keep an appointment. What may interact with this medication? Do not take this medication with any of the following: Cobicistat Itraconazole This medication may also interact with the following: Certain antibiotics, such as clarithromycin, rifampin, rifabutin Certain antivirals for HIV or AIDS Certain medications for fungal infections, such as ketoconazole, posaconazole, voriconazole Certain medications for seizures, such as carbamazepine, phenobarbital, phenytoin Gemfibrozil Nefazodone St. John's wort This list may not describe all possible interactions. Give your health care provider a list of all the medicines, herbs, non-prescription drugs, or dietary supplements you use. Also tell them if you smoke, drink alcohol, or use illegal drugs. Some items may interact with your medicine. What should I watch for while using this medication? Your condition will be monitored carefully while you are receiving this medication. You may need blood work while taking this medication. This medication may make you feel generally unwell. This is not uncommon as chemotherapy can affect healthy cells as well as cancer cells. Report any side effects. Continue your course of treatment even though you feel ill unless your care team tells you to stop. This medication can cause serious side  effects. To reduce the risk, your care team may give you other medications to take before receiving this one. Be sure to follow the directions from your care team. This medication may affect your coordination, reaction time, or judgement. Do not drive or operate machinery until you know how this medication affects you. Sit up or stand slowly to reduce the risk of dizzy or fainting spells. Drinking alcohol with this medication can increase the risk of these side effects. This medication may increase your risk of getting an infection. Call your care team for advice if you get a fever, chills, sore throat, or other symptoms of a cold or flu. Do not treat yourself. Try to avoid being around people who are sick. Avoid taking medications that contain aspirin, acetaminophen, ibuprofen, naproxen, or ketoprofen unless instructed by your care team. These medications may hide a fever. This medication may increase your risk to bruise or bleed. Call your care team if you notice any unusual bleeding. Be careful brushing or flossing your teeth or using a toothpick because you may get an infection or bleed more easily. If you have any dental work done, tell your dentist you are receiving this medication. Talk to your care team if you or your partner are pregnant or think either of you might be pregnant. This medication can cause serious birth defects if taken during pregnancy and for 6 months after the last dose. You will need  a negative pregnancy test before starting this medication. Contraception is recommended while taking this medication and for 6 months after the last dose. Your care team can help you find the option that works for you. Do not father a child while taking this medication and for 3 months after the last dose. Use a condom for contraception during this time period. Do not breastfeed while taking this medication and for 7 days after the last dose. This medication may cause infertility. Talk to your care  team if you are concerned about your fertility. What side effects may I notice from receiving this medication? Side effects that you should report to your care team as soon as possible: Allergic reactions--skin rash, itching, hives, swelling of the face, lips, tongue, or throat Dry cough, shortness of breath or trouble breathing Increased saliva or tears, increased sweating, stomach cramping, diarrhea, small pupils, unusual weakness or fatigue, slow heartbeat Infection--fever, chills, cough, sore throat, wounds that don't heal, pain or trouble when passing urine, general feeling of discomfort or being unwell Kidney injury--decrease in the amount of urine, swelling of the ankles, hands, or feet Low red blood cell level--unusual weakness or fatigue, dizziness, headache, trouble breathing Severe or prolonged diarrhea Unusual bruising or bleeding Side effects that usually do not require medical attention (report to your care team if they continue or are bothersome): Constipation Diarrhea Hair loss Loss of appetite Nausea Stomach pain This list may not describe all possible side effects. Call your doctor for medical advice about side effects. You may report side effects to FDA at 1-800-FDA-1088. Where should I keep my medication? This medication is given in a hospital or clinic. It will not be stored at home. NOTE: This sheet is a summary. It may not cover all possible information. If you have questions about this medicine, talk to your doctor, pharmacist, or health care provider.  2024 Elsevier/Gold Standard (2021-07-29 00:00:00)   Leucovorin Injection What is this medication? LEUCOVORIN (loo koe VOR in) prevents side effects from certain medications, such as methotrexate. It works by increasing folate levels. This helps protect healthy cells in your body. It may also be used to treat anemia caused by low levels of folate. It can also be used with fluorouracil, a type of chemotherapy, to  treat colorectal cancer. It works by increasing the effects of fluorouracil in the body. This medicine may be used for other purposes; ask your health care provider or pharmacist if you have questions. What should I tell my care team before I take this medication? They need to know if you have any of these conditions: Anemia from low levels of vitamin B12 in the blood An unusual or allergic reaction to leucovorin, folic acid, other medications, foods, dyes, or preservatives Pregnant or trying to get pregnant Breastfeeding How should I use this medication? This medication is injected into a vein or a muscle. It is given by your care team in a hospital or clinic setting. Talk to your care team about the use of this medication in children. Special care may be needed. Overdosage: If you think you have taken too much of this medicine contact a poison control center or emergency room at once. NOTE: This medicine is only for you. Do not share this medicine with others. What if I miss a dose? Keep appointments for follow-up doses. It is important not to miss your dose. Call your care team if you are unable to keep an appointment. What may interact with this medication? Capecitabine Fluorouracil  Phenobarbital Phenytoin Primidone Trimethoprim;sulfamethoxazole This list may not describe all possible interactions. Give your health care provider a list of all the medicines, herbs, non-prescription drugs, or dietary supplements you use. Also tell them if you smoke, drink alcohol, or use illegal drugs. Some items may interact with your medicine. What should I watch for while using this medication? Your condition will be monitored carefully while you are receiving this medication. This medication may increase the side effects of 5-fluorouracil. Tell your care team if you have diarrhea or mouth sores that do not get better or that get worse. What side effects may I notice from receiving this  medication? Side effects that you should report to your care team as soon as possible: Allergic reactions--skin rash, itching, hives, swelling of the face, lips, tongue, or throat This list may not describe all possible side effects. Call your doctor for medical advice about side effects. You may report side effects to FDA at 1-800-FDA-1088. Where should I keep my medication? This medication is given in a hospital or clinic. It will not be stored at home. NOTE: This sheet is a summary. It may not cover all possible information. If you have questions about this medicine, talk to your doctor, pharmacist, or health care provider.  2024 Elsevier/Gold Standard (2021-08-20 00:00:00)   Fluorouracil Injection What is this medication? FLUOROURACIL (flure oh YOOR a sil) treats some types of cancer. It works by slowing down the growth of cancer cells. This medicine may be used for other purposes; ask your health care provider or pharmacist if you have questions. COMMON BRAND NAME(S): Adrucil What should I tell my care team before I take this medication? They need to know if you have any of these conditions: Blood disorders Dihydropyrimidine dehydrogenase (DPD) deficiency Infection, such as chickenpox, cold sores, herpes Kidney disease Liver disease Poor nutrition Recent or ongoing radiation therapy An unusual or allergic reaction to fluorouracil, other medications, foods, dyes, or preservatives If you or your partner are pregnant or trying to get pregnant Breast-feeding How should I use this medication? This medication is injected into a vein. It is administered by your care team in a hospital or clinic setting. Talk to your care team about the use of this medication in children. Special care may be needed. Overdosage: If you think you have taken too much of this medicine contact a poison control center or emergency room at once. NOTE: This medicine is only for you. Do not share this medicine  with others. What if I miss a dose? Keep appointments for follow-up doses. It is important not to miss your dose. Call your care team if you are unable to keep an appointment. What may interact with this medication? Do not take this medication with any of the following: Live virus vaccines This medication may also interact with the following: Medications that treat or prevent blood clots, such as warfarin, enoxaparin, dalteparin This list may not describe all possible interactions. Give your health care provider a list of all the medicines, herbs, non-prescription drugs, or dietary supplements you use. Also tell them if you smoke, drink alcohol, or use illegal drugs. Some items may interact with your medicine. What should I watch for while using this medication? Your condition will be monitored carefully while you are receiving this medication. This medication may make you feel generally unwell. This is not uncommon as chemotherapy can affect healthy cells as well as cancer cells. Report any side effects. Continue your course of treatment even though you  feel ill unless your care team tells you to stop. In some cases, you may be given additional medications to help with side effects. Follow all directions for their use. This medication may increase your risk of getting an infection. Call your care team for advice if you get a fever, chills, sore throat, or other symptoms of a cold or flu. Do not treat yourself. Try to avoid being around people who are sick. This medication may increase your risk to bruise or bleed. Call your care team if you notice any unusual bleeding. Be careful brushing or flossing your teeth or using a toothpick because you may get an infection or bleed more easily. If you have any dental work done, tell your dentist you are receiving this medication. Avoid taking medications that contain aspirin, acetaminophen, ibuprofen, naproxen, or ketoprofen unless instructed by your care  team. These medications may hide a fever. Do not treat diarrhea with over the counter products. Contact your care team if you have diarrhea that lasts more than 2 days or if it is severe and watery. This medication can make you more sensitive to the sun. Keep out of the sun. If you cannot avoid being in the sun, wear protective clothing and sunscreen. Do not use sun lamps, tanning beds, or tanning booths. Talk to your care team if you or your partner wish to become pregnant or think you might be pregnant. This medication can cause serious birth defects if taken during pregnancy and for 3 months after the last dose. A reliable form of contraception is recommended while taking this medication and for 3 months after the last dose. Talk to your care team about effective forms of contraception. Do not father a child while taking this medication and for 3 months after the last dose. Use a condom while having sex during this time period. Do not breastfeed while taking this medication. This medication may cause infertility. Talk to your care team if you are concerned about your fertility. What side effects may I notice from receiving this medication? Side effects that you should report to your care team as soon as possible: Allergic reactions--skin rash, itching, hives, swelling of the face, lips, tongue, or throat Heart attack--pain or tightness in the chest, shoulders, arms, or jaw, nausea, shortness of breath, cold or clammy skin, feeling faint or lightheaded Heart failure--shortness of breath, swelling of the ankles, feet, or hands, sudden weight gain, unusual weakness or fatigue Heart rhythm changes--fast or irregular heartbeat, dizziness, feeling faint or lightheaded, chest pain, trouble breathing High ammonia level--unusual weakness or fatigue, confusion, loss of appetite, nausea, vomiting, seizures Infection--fever, chills, cough, sore throat, wounds that don't heal, pain or trouble when passing urine,  general feeling of discomfort or being unwell Low red blood cell level--unusual weakness or fatigue, dizziness, headache, trouble breathing Pain, tingling, or numbness in the hands or feet, muscle weakness, change in vision, confusion or trouble speaking, loss of balance or coordination, trouble walking, seizures Redness, swelling, and blistering of the skin over hands and feet Severe or prolonged diarrhea Unusual bruising or bleeding Side effects that usually do not require medical attention (report to your care team if they continue or are bothersome): Dry skin Headache Increased tears Nausea Pain, redness, or swelling with sores inside the mouth or throat Sensitivity to light Vomiting This list may not describe all possible side effects. Call your doctor for medical advice about side effects. You may report side effects to FDA at 1-800-FDA-1088. Where should I keep  my medication? This medication is given in a hospital or clinic. It will not be stored at home. NOTE: This sheet is a summary. It may not cover all possible information. If you have questions about this medicine, talk to your doctor, pharmacist, or health care provider.  2024 Elsevier/Gold Standard (2021-07-23 00:00:00)   The chemotherapy medication bag should finish at 46 hours, 96 hours, or 7 days. For example, if your pump is scheduled for 46 hours and it was put on at 4:00 p.m., it should finish at 2:00 p.m. the day it is scheduled to come off regardless of your appointment time.     Estimated time to finish at 1:30 p.m. on Wednesday 03/18/2023.   If the display on your pump reads "Low Volume" and it is beeping, take the batteries out of the pump and come to the cancer center for it to be taken off.   If the pump alarms go off prior to the pump reading "Low Volume" then call [phone] and someone can assist you.  If the plunger comes out and the chemotherapy medication is leaking out, please use your home chemo  spill kit to clean up the spill. Do NOT use paper towels or other household products.  If you have problems or questions regarding your pump, please call either [phone] (24 hours a day) or the cancer center Monday-Friday 8:00 a.m.- 4:30 p.m. at the clinic number and we will assist you. If you are unable to get assistance, then go to the nearest Emergency Department and ask the staff to contact the IV team for assistance.

## 2023-03-16 NOTE — Patient Instructions (Signed)

## 2023-03-16 NOTE — Progress Notes (Signed)
Spoke w/ via phone for pre-op interview--- [facility] needs dos---- CBG and ISTAT per anesthesia        Lab results------Current EKG in Epic dated 01/26/23. COVID test -----patient states asymptomatic no test needed Arrive at ------- NPO after MN NO Solid Food.  Med rec completed Medications to take morning of surgery -----Metoprolol and Norco PRN Diabetic medication ----- NONE AM of surgery Patient instructed no nail polish to be worn day of surgery Patient instructed to bring photo id and insurance card day of surgery Patient aware to have Driver (ride ) / caregiver    for 24 hours after surgery - wife Aleck Weatherby Patient Special Instructions ----- Pre-Op special Instructions ----- Patient verbalized understanding of instructions that were given at this phone interview. Patient denies chest pain, sob, fever, cough at the interview.   Cardiac clearance on chart and in Epic dated 03/02/23. By Eligha Bridegroom, NP. Instructions to hold Pradaxa 2 days prior to procedure,pt verbalized instructions.

## 2023-03-16 NOTE — Progress Notes (Signed)
[facility] OFFICE PROGRESS NOTE   Diagnosis: Colon cancer  INTERVAL HISTORY:   [name] returns as scheduled.  He completed another cycle of FOLFIRI 03/03/2023.  Avastin is on hold.  He had a single episode of nausea/vomiting over a week ago.  No mouth sores.  No diarrhea.  For the past 2 to 3 days he has noted worsening pain mainly involving the low back and left leg but also notes shoulder and knee pain.  No leg weakness or numbness.  No bowel or bladder dysfunction.  Tramadol has not been effective.  He notes less drainage and overall improvement in the area of cutaneous induration at the left suprapubic region.  Objective:  Vital signs in last 24 hours:  Blood pressure 123/83, pulse 87, temperature 98.1 F (36.7 C), temperature source Temporal, resp. rate 18, height 6' (1.829 m), weight 204 lb (92.5 kg), SpO2 100%.    HEENT: No thrush or ulcers. Resp: Lungs clear bilaterally. Cardio: Regular rate and rhythm. GI: No hepatosplenomegaly. Vascular: No leg edema. Neuro: Lower extremity motor strength 5/5. Skin: Small area of cutaneous induration left suprapubic region, no drainage. The cath without erythema.  Lab Results:  Lab Results  Component Value Date   WBC 13.1 (H) 03/16/2023   HGB 11.3 (L) 03/16/2023   HCT 34.7 (L) 03/16/2023   MCV 101.8 (H) 03/16/2023   PLT 120 (L) 03/16/2023   NEUTROABS PENDING 03/16/2023    Imaging:  No results found.  Medications: I have reviewed the patient's current medications.  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, status post a left colectomy 03/04/2019 Mass felt to be in the transverse colon on a colonoscopy 01/07/2019 with a biopsy confirming poorly differentiated adenocarcinoma with signet ring cell features, mismatch repair protein expression normal CT abdomen/pelvis 01/08/2019 12.3 cm indeterminate  splenic mass, present in 2015 on a lumbar MRI-felt to be benign, small subpleural and pleural-based nodules at the lung bases, status post left nephrectomy CT chest 01/20/2019-small bilateral pulmonary nodules with rounded parenchymal nodules in the right lower lobe, indeterminate splenic mass CT chest 03/18/2019-multiple subcentimeter pulmonary nodules again seen bilaterally, greatest in the lower lobes, without significant change.  No new or enlarging pulmonary nodules or masses.  Partially visualized large heterogeneous splenic mass with no significant change.  Plan for follow-up chest CT at a 15-month interval. Cycle 1 FOLFOX 03/21/2019 Cycle 2 FOLFOX 04/04/2019 Cycle 3 FOLFOX 04/18/2019 (oxaliplatin dose reduced secondary to thrombocytopenia) Cycle 4 FOLFOX 05/02/2019 (oxaliplatin held secondary to thrombocytopenia) Cycle 5 FOLFOX 05/16/2019 Cycle 6 FOLFOX 05/30/2019 (oxaliplatin held secondary to thrombocytopenia) CT chest 06/08/2019-stable scattered small solid pulmonary nodules  Cycle 7 FOLFOX 06/13/2019 Cycle 8 FOLFOX 06/27/2019 Cycle 9 FOLFOX 07/11/2019 Cycle 10 FOLFOX 07/25/2019 (oxaliplatin held due to thrombocytopenia) Cycle 11 FOLFOX 08/08/2019 Cycle 12 FOLFOX 08/22/2019 CTs 01/09/2020-no evidence of recurrent disease, stable lung nodules favored to represent subpleural lymph nodes-dedicated follow-up not recommended CTs 01/11/2021-no evidence of recurrent disease CTs 01/24/2022-no evidence of recurrent disease CT lumbar spine, abdomen/pelvis 04/11/2022-numerous mixed lytic and sclerotic lesions scattered throughout the visualized axial and appendicular skeleton including several areas in the lumbar spine.   04/21/2022 CT biopsy sclerotic lesion right anterior iliac bone-consistent with metastatic colorectal adenocarcinoma, moderate to poorly differentiated.  Foundation 1-microsatellite stable, tumor mutation burden 4, K-ras G12S PET scan 05/09/2022-widespread hypermetabolic mixed faintly lytic and  sclerotic osseous metastases throughout the axial and proximal appendicular skeleton; associated mild pathologic compression fracture  at L3; no hypermetabolic extraosseous metastatic disease. Cycle 1 FOLFIRI/bevacizumab 05/26/2022 Cycle 2 FOLFIRI/bevacizumab 06/09/2022 Cycle 3 FOLFIRI 06/24/2022, bevacizumab 06/26/2022 Cycle 4 FOLFIRI 07/08/2022, bevacizumab held due to proteinuria Cycle 5 FOLFIRI 07/22/2022, bevacizumab held due to proteinuria CTs 07/31/2022-potentially areas of sclerosis represent interval healing; new lytic lesion at T12, multiple new areas of punched-out sclerosis throughout the visualized osseous structures.  Index lesion in the left iliac wing measures 1.3 x 2.8 cm.  Healing of previously metabolically occult metastatic disease on 05/09/2022 is a possibility. Cycle 6 FOLFIRI 08/04/2022, irinotecan dose reduced due to thrombocytopenia, bevacizumab held pending 24-hour urine result Cycle 7 FOLFIRI 08/18/2022, bevacizumab held secondary to proteinuria, G-CSF held Cycle 8 FOLFIRI 09/01/2022, bevacizumab held, G-CSF Cycle 9 FOLFIRI 09/16/2022, bevacizumab held, G-CSF CTs 09/29/2022-enlargement of a hypodense lesion posterior left lobe of the liver, similar diffuse mixed lytic and sclerotic osseous metastatic disease, multiple new small nodules medial right lung base, multiple other small bilateral pulmonary nodules unchanged. Cycle 10 FOLFIRI plus Avastin 10/08/2022 Cycle 11 FOLFIRI 10/27/2022, Avastin held Cycle 12 FOLFIRI/Avastin 11/11/2022 Cycle 13 FOLFIRI/Avastin 11/25/2022 CTs 12/08/2022-stable lesion posterior left lobe of the liver.  Unchanged diffuse lytic and sclerotic osseous metastatic disease.  Stable small solid pulmonary nodules.  Stable splenic lesions.  Slightly increased bladder wall thickening. Cycle 14 FOLFIRI/Avastin 12/10/2022 Cycle 15 FOLFIRI/Avastin 12/24/2022 12/31/2022 24-hour urine protein 710 mg Cycle 16 FOLFIRI/Avastin 01/07/2023 Cycle 17 FOLFIRI 01/21/2023, Avastin held  secondary to persistent proteinuria, slight increase in creatinine, and drainage from the suprapubic lesion CTs 01/26/2023-unchanged widespread sclerotic osseous metastatic disease, unchanged hypodense liver lesions, occasional small bilateral lung nodules unchanged. 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  Left nephrectomy at age 63 12.  Port-A-Cath placement, Dr. Michaell Cowing, 03/16/2019 13.  Thrombocytopenia secondary to chemotherapy-oxaliplatin dose reduced beginning with cycle 3 FOLFOX, improved 14.  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  Right groin soft tissue infection May 2023-status postsurgical debridement 18.  Left leg DVT-common femoral, femoral, popliteal, posterior tibial, and peroneal veins 19.  02/04/2022-MRI cervical spine-1.7 cm T1 lesion-indeterminate; bone scan 03/14/2022-focal intense activity at approximate T1 level felt to correspond to the lesion on MRI, additional foci of activity involving the lower thoracic and lumbar spine, right iliac bone and pubic symphysis.  CT lumbar spine, abdomen/pelvis 04/11/2022-numerous mixed lytic and sclerotic lesions scattered throughout  the visualized axial and appendicular skeleton including several areas in the lumbar spine.  04/21/2022 CT biopsy sclerotic lesion right anterior iliac bone-consistent with metastatic colorectal adenocarcinoma, moderate to poorly differentiated 20.  Inflamed sebaceous cyst 08/18/2022-doxycycline    Disposition: [name] appears stable.  He has completed 20 cycles of FOLFIRI.  Avastin has been on hold.  Plan to proceed with cycle 21 FOLFIRI today as scheduled, continue to hold Avastin.  Restaging CTs prior to next office visit.  He is having pain in multiple areas, mainly joints.  New prescription sent to his pharmacy for hydrocodone.  He will return for follow-up and treatment in 3 weeks rather than 2 due to a planned vacation.    Lonna Cobb ANP/GNP-BC   03/16/2023  10:19 AM

## 2023-03-18 ENCOUNTER — Inpatient Hospital Stay: Payer: BC Managed Care – PPO

## 2023-03-18 VITALS — BP 137/81 | HR 80 | Temp 98.0°F | Resp 18

## 2023-03-18 DIAGNOSIS — C787 Secondary malignant neoplasm of liver and intrahepatic bile duct: Secondary | ICD-10-CM | POA: Diagnosis not present

## 2023-03-18 DIAGNOSIS — C186 Malignant neoplasm of descending colon: Secondary | ICD-10-CM

## 2023-03-18 DIAGNOSIS — C184 Malignant neoplasm of transverse colon: Secondary | ICD-10-CM | POA: Diagnosis not present

## 2023-03-18 DIAGNOSIS — C7801 Secondary malignant neoplasm of right lung: Secondary | ICD-10-CM | POA: Diagnosis not present

## 2023-03-18 DIAGNOSIS — Z5111 Encounter for antineoplastic chemotherapy: Secondary | ICD-10-CM | POA: Diagnosis not present

## 2023-03-18 DIAGNOSIS — Z5189 Encounter for other specified aftercare: Secondary | ICD-10-CM | POA: Diagnosis not present

## 2023-03-18 DIAGNOSIS — C7951 Secondary malignant neoplasm of bone: Secondary | ICD-10-CM | POA: Diagnosis not present

## 2023-03-18 MED ORDER — HEPARIN SOD (PORK) LOCK FLUSH 100 UNIT/ML IV SOLN
500.0000 [IU] | Freq: Once | INTRAVENOUS | Status: AC | PRN
  Administered 2023-03-18: 500 [IU]

## 2023-03-18 MED ORDER — SODIUM CHLORIDE 0.9% FLUSH
10.0000 mL | INTRAVENOUS | Status: DC | PRN
  Administered 2023-03-18: 10 mL

## 2023-03-18 MED ORDER — PEGFILGRASTIM INJECTION 6 MG/0.6ML ~~LOC~~
6.0000 mg | PREFILLED_SYRINGE | Freq: Once | SUBCUTANEOUS | Status: AC
Start: 2023-03-18 — End: 2023-03-18
  Administered 2023-03-18: 6 mg via SUBCUTANEOUS
  Filled 2023-03-18: qty 0.6

## 2023-03-18 NOTE — Patient Instructions (Signed)

## 2023-03-18 NOTE — H&P (Signed)
REFERRING PHYSICIAN:  Rana Snare, NP   PROVIDER:  Elenora Gamma, MD   MRN: [id number] DOB: 11-Jul-1965   Subjective    Chief Complaint: new cancer (Sebaceous cyst )       History of Present Illness: [name] is a 57 y.o. male who is seen today as an office consultation at the request of Lonna Cobb for evaluation of new cancer (Sebaceous cyst ) .  57 year old male who was diagnosed with metastatic adenocarcinoma.  He underwent a left colectomy in December 2020 by Dr Michaell Cowing.  CT at that time showed pulmonary nodules and indeterminate splenic mass.  He has been on chemotherapy since then.  He has developed a chronically infected and draining cystic lesion in the suprapubic area.  This has been treated with antibiotics in the past but has not resolved.  He is here today for further evaluation.  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     Review of Systems: A complete review of systems was obtained from the patient.  I have reviewed this information and discussed as appropriate with the patient.  See HPI as well for other ROS.     Medical History: Past Medical History         Past Medical History:  Diagnosis Date   Arthritis     Asthma, unspecified asthma severity, unspecified whether complicated, unspecified whether persistent (HHS-HCC)     Chronic kidney disease     Colon cancer metastasized to bone (CMS/HHS-HCC)     Colon polyps     Diabetes mellitus without complication (CMS/HHS-HCC)     DVT (deep venous thrombosis) (CMS/HHS-HCC)     Heart valve disease     History of cancer     History of prostate cancer     Hurthle cell adenoma 1972   Hyperlipidemia          Problem List       Patient Active Problem List  Diagnosis   History of Wilms' tumor   Solitary kidney, acquired   S/P MVR (mitral valve repair)   Port-A-Cath in place   Cancer of descending colon (CMS/HHS-HCC)   Discitis of lumbar  region   History of colon cancer   Type 2 diabetes mellitus (CMS/HHS-HCC)   Necrotizing soft tissue infection   Mitral regurgitation   Idiopathic scoliosis and kyphoscoliosis   Family history of colon cancer   Malignant neoplasm of colorectal area with metastasis (CMS/HHS-HCC)        Past Surgical History           Past Surgical History:  Procedure Laterality Date   NEPHRECTOMY Left 1972   knree repair Left 1989   THYROIDECTOMY W/LIMITED NECK DISSECTION   2010   REPAIR MITRAL VALVE   2021   colonectomy       HERNIA REPAIR            Allergies           Allergies  Allergen Reactions   Cat Dander Anaphylaxis   Empagliflozin Other (See Comments)      Recurrent UTI        Medications Ordered Prior to Encounter             Current Outpatient Medications on File Prior to Visit  Medication Sig Dispense Refill   acetaminophen (TYLENOL) 500 MG tablet Take 500 mg by mouth every 8 (eight) hours as needed for Pain  or Fever       albuterol MDI, PROVENTIL, VENTOLIN, PROAIR, HFA 90 mcg/actuation inhaler Inhale 1-2 Inhalations into the lungs every 6 (six) hours as needed       ASMANEX TWISTHALER 220 mcg/ actuation (30) inhaler         aspirin 81 MG chewable tablet Take by mouth       aspirin 81 MG EC tablet Take 81 mg by mouth once daily       atorvastatin (LIPITOR) 10 MG tablet Take 10 mg by mouth at bedtime       calcium carbonate 300 mg (750 mg) chewable tablet Take 300 mg of elemental by mouth once daily       cetirizine (ZYRTEC) 10 MG tablet Take 10 mg by mouth at bedtime       CONCENTRATED insulin glargine (TOUJEO SOLOSTAR U-300 INSULIN) pen injector (concentration 300 units/mL) Inject 33 Units subcutaneously once daily Takes in the morning       dabigatran (PRADAXA) 150 mg capsule Take 150 mg by mouth 2 (two) times daily       ibuprofen (MOTRIN) 200 MG tablet Take 200 mg by mouth every 8 (eight) hours as needed for Pain       lidocaine-prilocaine (EMLA) cream Apply 30 g  topically once       lisinopriL (ZESTRIL) 10 MG tablet Take 10 mg by mouth every morning       lisinopriL (ZESTRIL) 2.5 MG tablet 1 tablet       loratadine (CLARITIN) 10 mg tablet Take 10 mg by mouth once daily as needed       metFORMIN (GLUCOPHAGE) 1000 MG tablet Take 1 tablet by mouth 2 (two) times daily with meals       metoprolol succinate (TOPROL-XL) 25 MG XL tablet Take 25 mg by mouth once daily       mometasone (ASMANEX TWISTHALER) 220 mcg/ actuation (60) inhaler Inhale 1 Puff into the lungs once daily       omega-3 fatty acids/fish oil (FISH OIL) 340-1,000 mg capsule Take 1 capsule by mouth 2 (two) times daily       pioglitazone (ACTOS) 30 MG tablet 1 tablet       prochlorperazine (COMPAZINE) 10 MG tablet Take 10 mg by mouth every 6 (six) hours as needed for Nausea or Vomiting       repaglinide (PRANDIN) 0.5 MG tablet Take 0.5 mg by mouth 2 (two) times daily before meals       repaglinide (PRANDIN) 2 MG tablet 1 tablet       TOUJEO SOLOSTAR U-300 INSULIN pen injector (concentration 300 units/mL) 26 units       traMADoL (ULTRAM) 50 mg tablet Take 50 mg by mouth every 6 (six) hours as needed        No current facility-administered medications on file prior to visit.        Family History           Family History  Problem Relation Age of Onset   Coronary Artery Disease (Blocked arteries around heart) Father     Hyperlipidemia (Elevated cholesterol) Sister     Thyroid cancer Maternal Aunt          Tobacco Use History  Social History         Tobacco Use  Smoking Status Never  Smokeless Tobacco Never        Social History  Social History           Socioeconomic History  Marital status: Married  Tobacco Use   Smoking status: Never   Smokeless tobacco: Never  Vaping Use   Vaping status: Never Used  Substance and Sexual Activity   Alcohol use: Not Currently   Drug use: Never   Sexual activity: Defer        Objective:      Vitals:   03/19/23 0700  BP:  116/83  Pulse: 94  Resp: 17  Temp: 98.4 F (36.9 C)  SpO2: 98%      Exam Gen: NAD CV: RRR Pulm: CTA Abd: soft There is a 3 cm area of induration and fluctuance draining a bloody purulent discharge in the left groin just above the base of the penis.     Labs, Imaging and Diagnostic Testing:     Assessment and Plan:  Diagnoses and all orders for this visit:   Sebaceous cyst     Patient appears to have a chronically draining sebaceous cyst/wound in his left groin.  I recommended debridement in the operating room with plans for healing by secondary intention.  We discussed that this could be a prolonged healing due to his chemotherapy.  Patient is comfortable with this plan would like to proceed when possible.  I recommended that we do the surgery in between his chemotherapy appointments that occur every other week.   Vanita Panda, MD Colon and Rectal Surgery [facility]

## 2023-03-18 NOTE — Anesthesia Preprocedure Evaluation (Signed)
Anesthesia Evaluation  Patient identified by MRN, date of birth, ID band Patient awake    Reviewed: Allergy & Precautions, NPO status , Patient's Chart, lab work & pertinent test results  History of Anesthesia Complications (+) PONV  Airway Mallampati: II  TM Distance: >3 FB Neck ROM: Full    Dental  (+) Dental Advisory Given   Pulmonary asthma , COPD,  COPD inhaler   breath sounds clear to auscultation       Cardiovascular hypertension, Pt. on medications and Pt. on home beta blockers (-) angina  Rhythm:Regular Rate:Normal  01/2023 ECHO: EF 60 to 65%. 1. The LV has normal function, no regional wall motion abnormalities. Grade I diastolic  dysfunction (impaired relaxation).   3. RVF is normal. The right ventricular size is normal.   4. The mitral valve has been repaired/replaced. No evidence of mitral valve regurgitation. Mild mitral stenosis. There is a 28 mm Sorin Memo annuloplasty ring present in the mitral position.   5. The aortic valve is tricuspid. Aortic valve regurgitation is not visualized. No aortic stenosis is present.     Neuro/Psych negative neurological ROS     GI/Hepatic Neg liver ROS,GERD  Controlled,,Colon cancer: chemo   Endo/Other  diabetes (glu 236), Insulin Dependent, Oral Hypoglycemic Agents    Renal/GU Renal InsufficiencyRenal disease   H/o prostate cancer    Musculoskeletal   Abdominal   Peds  Hematology Hb 11.3, plt 120k Pradaxa: last dose 03/14/2023   Anesthesia Other Findings   Reproductive/Obstetrics                             Anesthesia Physical Anesthesia Plan  ASA: 3  Anesthesia Plan: MAC   Post-op Pain Management: Tylenol PO (pre-op)*   Induction:   PONV Risk Score and Plan: 2 and Treatment may vary due to age or medical condition  Airway Management Planned: Natural Airway and Simple Face Mask  Additional Equipment: None  Intra-op Plan:    Post-operative Plan:   Informed Consent: I have reviewed the patients History and Physical, chart, labs and discussed the procedure including the risks, benefits and alternatives for the proposed anesthesia with the patient or authorized representative who has indicated his/her understanding and acceptance.     Dental advisory given  Plan Discussed with: CRNA and Surgeon  Anesthesia Plan Comments:         Anesthesia Quick Evaluation

## 2023-03-19 ENCOUNTER — Encounter (HOSPITAL_BASED_OUTPATIENT_CLINIC_OR_DEPARTMENT_OTHER)
Admission: RE | Disposition: A | Discharge status: Home / Self Care | Payer: Self-pay | Source: Home / Self Care | Attending: General Surgery

## 2023-03-19 ENCOUNTER — Other Ambulatory Visit: Payer: Self-pay

## 2023-03-19 ENCOUNTER — Ambulatory Visit (HOSPITAL_BASED_OUTPATIENT_CLINIC_OR_DEPARTMENT_OTHER)
Admission: RE | Admit: 2023-03-19 | Discharge: 2023-03-19 | Disposition: A | Discharge status: Home / Self Care | Payer: BC Managed Care – PPO | Attending: General Surgery | Admitting: General Surgery

## 2023-03-19 ENCOUNTER — Ambulatory Visit (HOSPITAL_BASED_OUTPATIENT_CLINIC_OR_DEPARTMENT_OTHER): Payer: Self-pay | Admitting: Anesthesiology

## 2023-03-19 ENCOUNTER — Encounter (HOSPITAL_BASED_OUTPATIENT_CLINIC_OR_DEPARTMENT_OTHER): Payer: Self-pay | Admitting: General Surgery

## 2023-03-19 DIAGNOSIS — Z952 Presence of prosthetic heart valve: Secondary | ICD-10-CM | POA: Diagnosis not present

## 2023-03-19 DIAGNOSIS — Z794 Long term (current) use of insulin: Secondary | ICD-10-CM | POA: Diagnosis not present

## 2023-03-19 DIAGNOSIS — Z7901 Long term (current) use of anticoagulants: Secondary | ICD-10-CM | POA: Diagnosis not present

## 2023-03-19 DIAGNOSIS — Z8546 Personal history of malignant neoplasm of prostate: Secondary | ICD-10-CM | POA: Insufficient documentation

## 2023-03-19 DIAGNOSIS — E119 Type 2 diabetes mellitus without complications: Secondary | ICD-10-CM | POA: Diagnosis not present

## 2023-03-19 DIAGNOSIS — Z79899 Other long term (current) drug therapy: Secondary | ICD-10-CM | POA: Insufficient documentation

## 2023-03-19 DIAGNOSIS — E1122 Type 2 diabetes mellitus with diabetic chronic kidney disease: Secondary | ICD-10-CM | POA: Diagnosis not present

## 2023-03-19 DIAGNOSIS — Z7984 Long term (current) use of oral hypoglycemic drugs: Secondary | ICD-10-CM | POA: Insufficient documentation

## 2023-03-19 DIAGNOSIS — L732 Hidradenitis suppurativa: Secondary | ICD-10-CM | POA: Diagnosis not present

## 2023-03-19 DIAGNOSIS — L089 Local infection of the skin and subcutaneous tissue, unspecified: Secondary | ICD-10-CM | POA: Diagnosis not present

## 2023-03-19 DIAGNOSIS — I050 Rheumatic mitral stenosis: Secondary | ICD-10-CM | POA: Diagnosis not present

## 2023-03-19 DIAGNOSIS — Z9049 Acquired absence of other specified parts of digestive tract: Secondary | ICD-10-CM | POA: Insufficient documentation

## 2023-03-19 DIAGNOSIS — N189 Chronic kidney disease, unspecified: Secondary | ICD-10-CM | POA: Insufficient documentation

## 2023-03-19 DIAGNOSIS — C785 Secondary malignant neoplasm of large intestine and rectum: Secondary | ICD-10-CM | POA: Insufficient documentation

## 2023-03-19 DIAGNOSIS — I129 Hypertensive chronic kidney disease with stage 1 through stage 4 chronic kidney disease, or unspecified chronic kidney disease: Secondary | ICD-10-CM | POA: Diagnosis not present

## 2023-03-19 DIAGNOSIS — L723 Sebaceous cyst: Secondary | ICD-10-CM | POA: Diagnosis not present

## 2023-03-19 DIAGNOSIS — J449 Chronic obstructive pulmonary disease, unspecified: Secondary | ICD-10-CM | POA: Diagnosis not present

## 2023-03-19 DIAGNOSIS — I10 Essential (primary) hypertension: Secondary | ICD-10-CM | POA: Diagnosis not present

## 2023-03-19 DIAGNOSIS — L929 Granulomatous disorder of the skin and subcutaneous tissue, unspecified: Secondary | ICD-10-CM | POA: Diagnosis not present

## 2023-03-19 DIAGNOSIS — M7989 Other specified soft tissue disorders: Secondary | ICD-10-CM | POA: Diagnosis not present

## 2023-03-19 DIAGNOSIS — L988 Other specified disorders of the skin and subcutaneous tissue: Secondary | ICD-10-CM | POA: Diagnosis not present

## 2023-03-19 DIAGNOSIS — J4489 Other specified chronic obstructive pulmonary disease: Secondary | ICD-10-CM | POA: Insufficient documentation

## 2023-03-19 HISTORY — PX: IRRIGATION AND DEBRIDEMENT SEBACEOUS CYST: SHX5255

## 2023-03-19 LAB — POCT I-STAT, CHEM 8
BUN: 20 mg/dL (ref 6–20)
Calcium, Ion: 1.04 mmol/L — ABNORMAL LOW (ref 1.15–1.40)
Chloride: 101 mmol/L (ref 98–111)
Creatinine, Ser: 1 mg/dL (ref 0.61–1.24)
Glucose, Bld: 236 mg/dL — ABNORMAL HIGH (ref 70–99)
HCT: 38 % — ABNORMAL LOW (ref 39.0–52.0)
Hemoglobin: 12.9 g/dL — ABNORMAL LOW (ref 13.0–17.0)
Potassium: 4.8 mmol/L (ref 3.5–5.1)
Sodium: 135 mmol/L (ref 135–145)
TCO2: 22 mmol/L (ref 22–32)

## 2023-03-19 LAB — GLUCOSE, CAPILLARY: Glucose-Capillary: 173 mg/dL — ABNORMAL HIGH (ref 70–99)

## 2023-03-19 SURGERY — IRRIGATION AND DEBRIDEMENT SEBACEOUS CYST
Anesthesia: Monitor Anesthesia Care | Site: Abdomen

## 2023-03-19 MED ORDER — ACETAMINOPHEN 500 MG PO TABS
1000.0000 mg | ORAL_TABLET | ORAL | Status: AC
  Administered 2023-03-19: 1000 mg via ORAL

## 2023-03-19 MED ORDER — PHENYLEPHRINE 80 MCG/ML (10ML) SYRINGE FOR IV PUSH (FOR BLOOD PRESSURE SUPPORT)
PREFILLED_SYRINGE | INTRAVENOUS | Status: DC | PRN
  Administered 2023-03-19: 80 ug via INTRAVENOUS

## 2023-03-19 MED ORDER — LIDOCAINE 2% (20 MG/ML) 5 ML SYRINGE
INTRAMUSCULAR | Status: DC | PRN
  Administered 2023-03-19: 40 mg via INTRAVENOUS

## 2023-03-19 MED ORDER — OXYCODONE HCL 5 MG/5ML PO SOLN: 5.0000 mg | Freq: Once | ORAL | Status: DC | PRN

## 2023-03-19 MED ORDER — CEFAZOLIN SODIUM-DEXTROSE 2-4 GM/100ML-% IV SOLN
2.0000 g | INTRAVENOUS | Status: AC
  Administered 2023-03-19: 2 g via INTRAVENOUS

## 2023-03-19 MED ORDER — MIDAZOLAM HCL 2 MG/2ML IJ SOLN
INTRAMUSCULAR | Status: DC | PRN
  Administered 2023-03-19: 2 mg via INTRAVENOUS

## 2023-03-19 MED ORDER — SODIUM CHLORIDE 0.9 % IV SOLN: INTRAVENOUS | Status: DC

## 2023-03-19 MED ORDER — BUPIVACAINE-EPINEPHRINE (PF) 0.25% -1:200000 IJ SOLN
INTRAMUSCULAR | Status: DC | PRN
  Administered 2023-03-19: 27 mL

## 2023-03-19 MED ORDER — PROPOFOL 500 MG/50ML IV EMUL
INTRAVENOUS | Status: DC | PRN
  Administered 2023-03-19: 180 ug/kg/min via INTRAVENOUS

## 2023-03-19 MED ORDER — PROPOFOL 10 MG/ML IV BOLUS
INTRAVENOUS | Status: AC
  Filled 2023-03-19: qty 20

## 2023-03-19 MED ORDER — ACETAMINOPHEN 500 MG PO TABS: 1000.0000 mg | ORAL_TABLET | Freq: Once | ORAL | Status: DC

## 2023-03-19 MED ORDER — FENTANYL CITRATE (PF) 100 MCG/2ML IJ SOLN: 25.0000 ug | INTRAMUSCULAR | Status: DC | PRN

## 2023-03-19 MED ORDER — MIDAZOLAM HCL 2 MG/2ML IJ SOLN
INTRAMUSCULAR | Status: AC
  Filled 2023-03-19: qty 2

## 2023-03-19 MED ORDER — 0.9 % SODIUM CHLORIDE (POUR BTL) OPTIME
TOPICAL | Status: DC | PRN
  Administered 2023-03-19: 500 mL

## 2023-03-19 MED ORDER — MEPERIDINE HCL 25 MG/ML IJ SOLN: 6.2500 mg | INTRAMUSCULAR | Status: DC | PRN

## 2023-03-19 MED ORDER — CEFAZOLIN SODIUM-DEXTROSE 2-4 GM/100ML-% IV SOLN
INTRAVENOUS | Status: AC
  Filled 2023-03-19: qty 100

## 2023-03-19 MED ORDER — INSULIN REGULAR HUMAN 100 UNIT/ML IJ SOLN
5.0000 [IU] | Freq: Once | INTRAMUSCULAR | Status: AC
  Administered 2023-03-19: 5 [IU] via SUBCUTANEOUS
  Filled 2023-03-19: qty 3

## 2023-03-19 MED ORDER — SODIUM CHLORIDE 0.9% FLUSH: 3.0000 mL | Freq: Two times a day (BID) | INTRAVENOUS | Status: DC

## 2023-03-19 MED ORDER — PROPOFOL 10 MG/ML IV BOLUS
INTRAVENOUS | Status: DC | PRN
  Administered 2023-03-19: 20 mg via INTRAVENOUS

## 2023-03-19 MED ORDER — BUPIVACAINE-EPINEPHRINE 0.5% -1:200000 IJ SOLN: INTRAMUSCULAR | Status: DC | PRN

## 2023-03-19 MED ORDER — ONDANSETRON HCL 4 MG/2ML IJ SOLN
INTRAMUSCULAR | Status: DC | PRN
  Administered 2023-03-19: 4 mg via INTRAVENOUS

## 2023-03-19 MED ORDER — FENTANYL CITRATE (PF) 100 MCG/2ML IJ SOLN
INTRAMUSCULAR | Status: AC
  Filled 2023-03-19: qty 2

## 2023-03-19 MED ORDER — INSULIN ASPART 100 UNIT/ML IJ SOLN
INTRAMUSCULAR | Status: AC
  Filled 2023-03-19: qty 1

## 2023-03-19 MED ORDER — MIDAZOLAM HCL 2 MG/2ML IJ SOLN
0.5000 mg | Freq: Once | INTRAMUSCULAR | Status: DC | PRN
Start: 2023-03-19 — End: 2023-03-19

## 2023-03-19 MED ORDER — ACETAMINOPHEN 500 MG PO TABS
ORAL_TABLET | ORAL | Status: AC
  Filled 2023-03-19: qty 2

## 2023-03-19 MED ORDER — OXYCODONE HCL 5 MG PO TABS: 5.0000 mg | ORAL_TABLET | Freq: Once | ORAL | Status: DC | PRN

## 2023-03-19 MED ORDER — FENTANYL CITRATE (PF) 250 MCG/5ML IJ SOLN
INTRAMUSCULAR | Status: DC | PRN
  Administered 2023-03-19: 50 ug via INTRAVENOUS
  Administered 2023-03-19 (×2): 25 ug via INTRAVENOUS

## 2023-03-19 SURGICAL SUPPLY — 24 items
BLADE SURG 15 STRL LF DISP TIS (BLADE) ×1 IMPLANT
CHLORAPREP W/TINT 26 (MISCELLANEOUS) ×1 IMPLANT
COVER BACK TABLE 60X90IN (DRAPES) ×1 IMPLANT
COVER MAYO STAND STRL (DRAPES) ×1 IMPLANT
DERMABOND ADVANCED .7 DNX12 (GAUZE/BANDAGES/DRESSINGS) IMPLANT
DRAPE LAPAROTOMY 100X72 PEDS (DRAPES) ×1 IMPLANT
DRAPE UTILITY XL STRL (DRAPES) ×1 IMPLANT
ELECT REM PT RETURN 9FT ADLT (ELECTROSURGICAL) ×1 IMPLANT
ELECTRODE REM PT RTRN 9FT ADLT (ELECTROSURGICAL) ×1 IMPLANT
GLOVE BIO SURGEON STRL SZ 6.5 (GLOVE) ×1 IMPLANT
GLOVE BIOGEL PI IND STRL 6 (GLOVE) IMPLANT
GLOVE INDICATOR 6.5 STRL GRN (GLOVE) ×1 IMPLANT
GOWN STRL REUS W/TWL LRG LVL3 (GOWN DISPOSABLE) IMPLANT
GOWN STRL REUS W/TWL XL LVL3 (GOWN DISPOSABLE) ×1 IMPLANT
KIT BASIN OR (CUSTOM PROCEDURE TRAY) ×1 IMPLANT
KIT TURNOVER CYSTO (KITS) ×1 IMPLANT
MANIFOLD NEPTUNE II (INSTRUMENTS) ×1 IMPLANT
NDL HYPO 22X1.5 SAFETY MO (MISCELLANEOUS) ×1 IMPLANT
NEEDLE HYPO 22X1.5 SAFETY MO (MISCELLANEOUS) ×1 IMPLANT
NS IRRIG 500ML POUR BTL (IV SOLUTION) IMPLANT
PAD ARMBOARD 7.5X6 YLW CONV (MISCELLANEOUS) ×1 IMPLANT
SUT VIC AB 3-0 SH 18 (SUTURE) IMPLANT
SUT VIC AB 4-0 PS2 18 (SUTURE) IMPLANT
SYR CONTROL 10ML LL (SYRINGE) ×1 IMPLANT

## 2023-03-19 NOTE — Anesthesia Postprocedure Evaluation (Signed)
Anesthesia Post Note  Patient: [name]  Procedure(s) Performed: DEBRIDEMENT OF CHRONIC INFECTION (Abdomen)     Patient location during evaluation: Phase II Anesthesia Type: MAC Level of consciousness: awake and alert, patient cooperative and oriented Pain management: pain level controlled Vital Signs Assessment: post-procedure vital signs reviewed and stable Respiratory status: spontaneous breathing, nonlabored ventilation and respiratory function stable Cardiovascular status: blood pressure returned to baseline and stable Postop Assessment: no apparent nausea or vomiting and able to ambulate Anesthetic complications: no   No notable events documented.  Last Vitals:  Vitals:   03/19/23 0810 03/19/23 0845  BP: (!) 88/56 106/76  Pulse: 82 82  Resp: 16 16  Temp: 36.8 C 36.9 C  SpO2: 97% 99%    Last Pain:  Vitals:   03/19/23 0845  TempSrc:   PainSc: 0-No pain                 [name],E. Jatara Huettner

## 2023-03-19 NOTE — Discharge Instructions (Addendum)
Do NOT TAKE TYLENOL UNTIL AFTER 1pm today  GENERAL SURGERY: POST OP INSTRUCTIONS  DIET: Follow a light bland diet the first 24 hours after arrival home, such as soup, liquids, crackers, etc.  Be sure to include lots of fluids daily.  Avoid fast food or heavy meals as your are more likely to get nauseated.   Take your usually prescribed home medications unless otherwise directed. PAIN CONTROL: Pain is best controlled by a usual combination of three different methods TOGETHER: Ice/Heat Over the counter pain medication Prescription pain medication Most patients will experience some swelling and bruising around the incisions.  Ice packs or heating pads (30-60 minutes up to 6 times a day) will help. Use ice for the first few days to help decrease swelling and bruising, then switch to heat to help relax tight/sore spots and speed recovery.  Some people prefer to use ice alone, heat alone, alternating between ice & heat.  Experiment to what works for you.  Swelling and bruising can take several weeks to resolve.   It is helpful to take an over-the-counter pain medication regularly for the first few weeks.  Choose one of the following that works best for you: Naproxen (Aleve, etc)  Two 220mg  tabs twice a day Ibuprofen (Advil, etc) Three 200mg  tabs four times a day (every meal & bedtime) A  prescription for pain medication (such as Percocet, oxycodone, hydrocodone, etc) should be given to you upon discharge.  Take your pain medication as prescribed.  If you are having problems/concerns with the prescription medicine (does not control pain, nausea, vomiting, rash, itching, etc), please call us [phone] to see if we need to switch you to a different pain medicine that will work better for you and/or control your side effect better. If you need a refill on your pain medication, please contact your pharmacy.  They will contact our office to request authorization. Prescriptions will not be filled after 5  pm or on week-ends. Avoid getting constipated.  Between the surgery and the pain medications, it is common to experience some constipation.  Increasing fluid intake and taking a fiber supplement (such as Metamucil, Citrucel, FiberCon, MiraLax, etc) 1-2 times a day regularly will usually help prevent this problem from occurring.  A mild laxative (prune juice, Milk of Magnesia, MiraLax, etc) should be taken according to package directions if there are no bowel movements after 48 hours.   Wash / shower every day.  You may shower over the dressings as they are waterproof.  Continue to shower over incision(s) after the dressing is off. You may leave the incision open to air.  Your skin glue will fall off in 7-10 days.  You may replace a dressing/Band-Aid to cover the incision for comfort if you wish.   ACTIVITIES as tolerated:   You may resume regular (light) daily activities beginning the next day--such as daily self-care, walking, climbing stairs--gradually increasing activities as tolerated.  If you can walk 30 minutes without difficulty, it is safe to try more intense activity such as jogging, treadmill, bicycling, low-impact aerobics, swimming, etc. Save the most intensive and strenuous activity for last such as sit-ups, heavy lifting, contact sports, etc  Refrain from any heavy lifting or straining until you are off narcotics for pain control.   DO NOT PUSH THROUGH PAIN.  Let pain be your guide: If it hurts to do something, don't do it.  Pain is your body warning you to avoid that activity for another week until the pain  goes down. You may drive when you are no longer taking prescription pain medication, you can comfortably wear a seatbelt, and you can safely maneuver your car and apply brakes. You may have sexual intercourse when it is comfortable.  FOLLOW UP in our office Please call [facility] at [phone] to set up an appointment to see your surgeon in the office for a follow-up appointment  approximately 2-3 weeks after your surgery. Make sure that you call for this appointment the day you arrive home to insure a convenient appointment time. 9. IF YOU HAVE DISABILITY OR FAMILY LEAVE FORMS, BRING THEM TO THE OFFICE FOR PROCESSING.  DO NOT GIVE THEM TO YOUR DOCTOR.   WHEN TO CALL us [phone]: Poor pain control Reactions / problems with new medications (rash/itching, nausea, etc)  Fever over 101.5 F (38.5 C) Worsening swelling or bruising Continued bleeding from incision. Increased pain, redness, or drainage from the incision   The clinic staff is available to answer your questions during regular business hours (8:30am-5pm).  Please don't hesitate to call and ask to speak to one of our nurses for clinical concerns.   If you have a medical emergency, go to the nearest emergency room or call 911.  A surgeon from [facility] is always on call at the [facility], [place] [address], [place], [place]  [address] ? MAIN: [phone] ? TOLL FREE: [phone] ?  FAX [phone] [email or web address]   Post Anesthesia Home Care Instructions  Activity: Get plenty of rest for the remainder of the day. A responsible adult should stay with you for 24 hours following the procedure.  For the next 24 hours, DO NOT: -Drive a car -Advertising copywriter -Drink alcoholic beverages -Take any medication unless instructed by your physician -Make any legal decisions or sign important papers.  Meals: Start with liquid foods such as gelatin or soup. Progress to regular foods as tolerated. Avoid greasy, spicy, heavy foods. If nausea and/or vomiting occur, drink only clear liquids until the nausea and/or vomiting subsides. Call your physician if vomiting continues.  Special Instructions/Symptoms: Your throat may feel dry or sore from the anesthesia or the breathing tube placed in your throat during surgery. If this causes  discomfort, gargle with warm salt water. The discomfort should disappear within 24 hours.

## 2023-03-19 NOTE — Transfer of Care (Signed)
Immediate Anesthesia Transfer of Care Note  Patient: [name]  Procedure(s) Performed: DEBRIDEMENT OF CHRONIC INFECTION (Abdomen)  Patient Location: PACU  Anesthesia Type:MAC  Level of Consciousness: awake, alert , and oriented  Airway & Oxygen Therapy: Patient Spontanous Breathing  Post-op Assessment: Report given to RN and Post -op Vital signs reviewed and stable  Post vital signs: Reviewed and stable  Last Vitals:  Vitals Value Taken Time  BP    Temp    Pulse 82 03/19/23 0810  Resp    SpO2 97 % 03/19/23 0810  Vitals shown include unfiled device data.  Last Pain:  Vitals:   03/19/23 0700  TempSrc: Oral         Complications: No notable events documented.

## 2023-03-19 NOTE — Op Note (Signed)
03/19/2023  8:06 AM  PATIENT:  [name]  57 y.o. male  Patient Care Team: Joycelyn Rua, MD as PCP - General (Family Medicine) Little Ishikawa, MD as PCP - Cardiology (Cardiology) Corky Crafts, MD as Consulting Physician (Interventional Cardiology) Karie Soda, MD as Consulting Physician (Colon and Rectal Surgery) Charlott Rakes, MD as Consulting Physician (Gastroenterology) Ladene Artist, MD as Consulting Physician (Oncology)  PRE-OPERATIVE DIAGNOSIS:  INFECTED SEBACEOUS CYST  POST-OPERATIVE DIAGNOSIS:  HYDRADENITIS  PROCEDURE:  EXCISION OF CHRONICALLY INFECTED TISSUE    Surgeon(s): Romie Levee, MD  ASSISTANT: none   ANESTHESIA:   local and MAC  EBL:  Total I/O In: 300 [I.V.:300] Out: 1 [Blood:1]  DRAINS: none   SPECIMEN:  Source of Specimen:  infected suprapubic tissue  DISPOSITION OF SPECIMEN:  PATHOLOGY  COUNTS:  YES  PLAN OF CARE: Discharge to home after PACU  PATIENT DISPOSITION:  PACU - hemodynamically stable.  INDICATION: 57 y.o. M on active chemotherapy with a chronic suprapubic infection that has recurred many times despite adequate antibiotic treatment.   OR FINDINGS: tissue inflammation pattern more consistent with hydradenitis   DESCRIPTION: the patient was identified in the preoperative holding area and taken to the OR where they were laid supine on the operating room table.  MAC anesthesia was induced without difficulty. SCDs were also noted to be in place prior to the initiation of anesthesia.  The patient was then prepped and draped in the usual sterile fashion.   A surgical timeout was performed indicating the correct patient, procedure, positioning and need for preoperative antibiotics.   I began by making an incision over the area of inflammation in the patient's suprapubic region just above the base of the penis.  An elliptical incision was made.  Dissection was carried down through the subcutaneous tissues  on the superior side using sharp dissection.  I then noticed that the granulation tissue tracked underneath the skin flap of the inferior side.  I removed some added skin in this area down to the level of the base of the penis.  I then enlarge my incision to continue the ellipse.  I removed all of the chronically inflamed granulation tissue using sharp dissection.  Hemostasis was then achieved using electrocautery.  Dissection was carried down to the level of the subcutaneous fat.  The wound was then irrigated with Betadine and closed using interrupted 3-0 Vicryl sutures and a 4-0 Vicryl subcuticular suture for the skin.  Dermabond was applied.  The patient was then awakened from anesthesia and sent to the postanesthesia care unit in stable condition.  All counts were correct per operating room staff.  Vanita Panda, MD  Colorectal and General Surgery [facility]

## 2023-03-20 ENCOUNTER — Encounter (HOSPITAL_BASED_OUTPATIENT_CLINIC_OR_DEPARTMENT_OTHER): Payer: Self-pay | Admitting: General Surgery

## 2023-03-20 LAB — SURGICAL PATHOLOGY

## 2023-03-26 DIAGNOSIS — C186 Malignant neoplasm of descending colon: Secondary | ICD-10-CM | POA: Diagnosis not present

## 2023-04-04 ENCOUNTER — Other Ambulatory Visit: Payer: Self-pay | Admitting: Oncology

## 2023-04-04 ENCOUNTER — Ambulatory Visit (HOSPITAL_BASED_OUTPATIENT_CLINIC_OR_DEPARTMENT_OTHER)
Admission: RE | Admit: 2023-04-04 | Discharge: 2023-04-04 | Disposition: A | Discharge status: Home / Self Care | Payer: BC Managed Care – PPO | Source: Ambulatory Visit | Attending: Nurse Practitioner | Admitting: Nurse Practitioner

## 2023-04-04 DIAGNOSIS — C186 Malignant neoplasm of descending colon: Secondary | ICD-10-CM

## 2023-04-04 DIAGNOSIS — K8020 Calculus of gallbladder without cholecystitis without obstruction: Secondary | ICD-10-CM | POA: Diagnosis not present

## 2023-04-04 DIAGNOSIS — K769 Liver disease, unspecified: Secondary | ICD-10-CM | POA: Diagnosis not present

## 2023-04-04 DIAGNOSIS — C189 Malignant neoplasm of colon, unspecified: Secondary | ICD-10-CM | POA: Insufficient documentation

## 2023-04-04 DIAGNOSIS — C7951 Secondary malignant neoplasm of bone: Secondary | ICD-10-CM | POA: Insufficient documentation

## 2023-04-04 MED ORDER — IOHEXOL 300 MG/ML  SOLN
100.0000 mL | Freq: Once | INTRAMUSCULAR | Status: AC | PRN
  Administered 2023-04-04: 100 mL via INTRAVENOUS

## 2023-04-07 ENCOUNTER — Ambulatory Visit: Payer: BC Managed Care – PPO

## 2023-04-07 ENCOUNTER — Inpatient Hospital Stay: Payer: BC Managed Care – PPO

## 2023-04-07 ENCOUNTER — Inpatient Hospital Stay: Payer: BC Managed Care – PPO | Attending: Oncology

## 2023-04-07 ENCOUNTER — Inpatient Hospital Stay (HOSPITAL_BASED_OUTPATIENT_CLINIC_OR_DEPARTMENT_OTHER): Payer: BC Managed Care – PPO | Admitting: Oncology

## 2023-04-07 VITALS — BP 136/85 | HR 92 | Temp 98.2°F | Resp 18 | Ht 72.0 in | Wt 199.5 lb

## 2023-04-07 VITALS — BP 145/91 | HR 84

## 2023-04-07 DIAGNOSIS — Z5189 Encounter for other specified aftercare: Secondary | ICD-10-CM | POA: Diagnosis not present

## 2023-04-07 DIAGNOSIS — C186 Malignant neoplasm of descending colon: Secondary | ICD-10-CM | POA: Diagnosis not present

## 2023-04-07 DIAGNOSIS — C7801 Secondary malignant neoplasm of right lung: Secondary | ICD-10-CM | POA: Diagnosis not present

## 2023-04-07 DIAGNOSIS — C787 Secondary malignant neoplasm of liver and intrahepatic bile duct: Secondary | ICD-10-CM | POA: Diagnosis not present

## 2023-04-07 DIAGNOSIS — Z5111 Encounter for antineoplastic chemotherapy: Secondary | ICD-10-CM | POA: Insufficient documentation

## 2023-04-07 DIAGNOSIS — C7951 Secondary malignant neoplasm of bone: Secondary | ICD-10-CM | POA: Insufficient documentation

## 2023-04-07 LAB — CBC WITH DIFFERENTIAL (CANCER CENTER ONLY)
Abs Immature Granulocytes: 1 10*3/uL — ABNORMAL HIGH (ref 0.00–0.07)
Basophils Absolute: 0.1 10*3/uL (ref 0.0–0.1)
Basophils Relative: 1 %
Eosinophils Absolute: 0.7 10*3/uL — ABNORMAL HIGH (ref 0.0–0.5)
Eosinophils Relative: 4 %
HCT: 34.6 % — ABNORMAL LOW (ref 39.0–52.0)
Hemoglobin: 11.5 g/dL — ABNORMAL LOW (ref 13.0–17.0)
Immature Granulocytes: 7 %
Lymphocytes Relative: 6 %
Lymphs Abs: 0.9 10*3/uL (ref 0.7–4.0)
MCH: 33.1 pg (ref 26.0–34.0)
MCHC: 33.2 g/dL (ref 30.0–36.0)
MCV: 99.7 fL (ref 80.0–100.0)
Monocytes Absolute: 1.3 10*3/uL — ABNORMAL HIGH (ref 0.1–1.0)
Monocytes Relative: 8 %
Neutro Abs: 11.4 10*3/uL — ABNORMAL HIGH (ref 1.7–7.7)
Neutrophils Relative %: 74 %
Platelet Count: 149 10*3/uL — ABNORMAL LOW (ref 150–400)
RBC: 3.47 MIL/uL — ABNORMAL LOW (ref 4.22–5.81)
RDW: 15.9 % — ABNORMAL HIGH (ref 11.5–15.5)
WBC Count: 15.4 10*3/uL — ABNORMAL HIGH (ref 4.0–10.5)
nRBC: 0 % (ref 0.0–0.2)

## 2023-04-07 LAB — CMP (CANCER CENTER ONLY)
ALT: 9 U/L (ref 0–44)
AST: 17 U/L (ref 15–41)
Albumin: 3.7 g/dL (ref 3.5–5.0)
Alkaline Phosphatase: 563 U/L — ABNORMAL HIGH (ref 38–126)
Anion gap: 9 (ref 5–15)
BUN: 21 mg/dL — ABNORMAL HIGH (ref 6–20)
CO2: 24 mmol/L (ref 22–32)
Calcium: 8.7 mg/dL — ABNORMAL LOW (ref 8.9–10.3)
Chloride: 97 mmol/L — ABNORMAL LOW (ref 98–111)
Creatinine: 0.87 mg/dL (ref 0.61–1.24)
GFR, Estimated: >60 mL/min (ref >60)
Glucose, Bld: 420 mg/dL — ABNORMAL HIGH (ref 70–99)
Potassium: 4.9 mmol/L (ref 3.5–5.1)
Sodium: 130 mmol/L — ABNORMAL LOW (ref 135–145)
Total Bilirubin: 0.6 mg/dL (ref 0.0–1.2)
Total Protein: 6.9 g/dL (ref 6.5–8.1)

## 2023-04-07 LAB — TOTAL PROTEIN, URINE DIPSTICK: Protein, ur: 100 mg/dL — AB

## 2023-04-07 MED ORDER — FLUOROURACIL CHEMO INJECTION 5 GM/100ML
2400.0000 mg/m2 | INTRAVENOUS | Status: DC
  Administered 2023-04-07: 5000 mg via INTRAVENOUS
  Filled 2023-04-07: qty 100

## 2023-04-07 MED ORDER — ATROPINE SULFATE 1 MG/ML IV SOLN
0.5000 mg | Freq: Once | INTRAVENOUS | Status: AC | PRN
  Administered 2023-04-07: 0.5 mg via INTRAVENOUS
  Filled 2023-04-07: qty 1

## 2023-04-07 MED ORDER — SODIUM CHLORIDE 0.9 % IV SOLN
140.0000 mg/m2 | Freq: Once | INTRAVENOUS | Status: AC
  Administered 2023-04-07: 300 mg via INTRAVENOUS
  Filled 2023-04-07: qty 15

## 2023-04-07 MED ORDER — DEXAMETHASONE SODIUM PHOSPHATE 10 MG/ML IJ SOLN
10.0000 mg | Freq: Once | INTRAMUSCULAR | Status: AC
  Administered 2023-04-07: 10 mg via INTRAVENOUS
  Filled 2023-04-07: qty 1

## 2023-04-07 MED ORDER — SODIUM CHLORIDE 0.9 % IV SOLN: Freq: Once | INTRAVENOUS | Status: AC

## 2023-04-07 MED ORDER — PALONOSETRON HCL INJECTION 0.25 MG/5ML
0.2500 mg | Freq: Once | INTRAVENOUS | Status: AC
  Administered 2023-04-07: 0.25 mg via INTRAVENOUS
  Filled 2023-04-07: qty 5

## 2023-04-07 MED ORDER — SODIUM CHLORIDE 0.9 % IV SOLN
400.0000 mg/m2 | Freq: Once | INTRAVENOUS | Status: AC
  Administered 2023-04-07: 872 mg via INTRAVENOUS
  Filled 2023-04-07: qty 43.6

## 2023-04-07 MED ORDER — FLUOROURACIL CHEMO INJECTION 2.5 GM/50ML
400.0000 mg/m2 | Freq: Once | INTRAVENOUS | Status: AC
  Administered 2023-04-07: 850 mg via INTRAVENOUS
  Filled 2023-04-07: qty 17

## 2023-04-07 NOTE — Progress Notes (Signed)
 [name] OFFICE PROGRESS NOTE   Diagnosis: Colon cancer  INTERVAL HISTORY:   [name] completed another cycle FOLFIRI on 03/16/2023.  No nausea/vomiting, mouth sores, or diarrhea.  He underwent resection of an area of hidradenitis by [name] 03/19/2023.  He reports the wound has healed.  He has increased pain, chiefly at the lower back.  He takes hydrocodone , tramadol , and ibuprofen as needed.  He is working.  Objective:  Vital signs in last 24 hours:  Blood pressure 136/85, pulse 92, temperature 98.2 F (36.8 C), temperature source Temporal, resp. rate 18, height 6' (1.829 m), weight 199 lb 8 oz (90.5 kg), SpO2 100%.    HEENT: No thrush or ulcers Resp: Lungs clear bilaterally Cardio: Regular rate and rhythm GI: No hepatomegaly, healed surgical incision at the left suprapubic region/groin Vascular: No leg edema Musculoskeletal: No spine tenderness  Portacath/PICC-without erythema  Lab Results:  Lab Results  Component Value Date   WBC 15.4 (H) 04/07/2023   HGB 11.5 (L) 04/07/2023   HCT 34.6 (L) 04/07/2023   MCV 99.7 04/07/2023   PLT 149 (L) 04/07/2023   NEUTROABS 11.4 (H) 04/07/2023    CMP  Lab Results  Component Value Date   NA 130 (L) 04/07/2023   K 4.9 04/07/2023   CL 97 (L) 04/07/2023   CO2 24 04/07/2023   GLUCOSE 420 (H) 04/07/2023   BUN 21 (H) 04/07/2023   CREATININE 0.87 04/07/2023   CALCIUM  8.7 (L) 04/07/2023   PROT 6.9 04/07/2023   ALBUMIN  3.7 04/07/2023   AST 17 04/07/2023   ALT 9 04/07/2023   ALKPHOS 563 (H) 04/07/2023   BILITOT 0.6 04/07/2023   GFRNONAA >60 04/07/2023   GFRAA 115 02/08/2020    Lab Results  Component Value Date   CEA1 2.00 07/13/2020   CEA 12.98 (H) 03/16/2023    Lab Results  Component Value Date   INR 1.1 08/13/2021   LABPROT 14.2 08/13/2021    Imaging:  CT CHEST ABDOMEN PELVIS W CONTRAST Result Date: 04/06/2023 CLINICAL DATA:  Metastatic colon cancer restaging * Tracking Code: BO * EXAM: CT  CHEST, ABDOMEN, AND PELVIS WITH CONTRAST TECHNIQUE: Multidetector CT imaging of the chest, abdomen and pelvis was performed following the standard protocol during bolus administration of intravenous contrast. RADIATION DOSE REDUCTION: This exam was performed according to the departmental dose-optimization program which includes automated exposure control, adjustment of the mA and/or kV according to patient size and/or use of iterative reconstruction technique. CONTRAST:  OMNIPAQUE  IOHEXOL  300 MG/ML SOLN additional oral enteric contrast COMPARISON:  01/26/2023 FINDINGS: CT CHEST FINDINGS Cardiovascular: Right chest port catheter. Aortic atherosclerosis. Normal heart size. Mitral prosthesis. No pericardial effusion. Mediastinum/Nodes: No enlarged mediastinal, hilar, or axillary lymph nodes. Thyroid  gland, trachea, and esophagus demonstrate no significant findings. Lungs/Pleura: Diffuse bilateral bronchial wall thickening. Unchanged segmental bronchiectasis in the peripheral right upper lobe (series 4, image 55). Multiple small bilateral pulmonary nodules in the dependent lung bases unchanged, largest again 0.5 cm in the right lower lobe (series 4, image 73). No pleural effusion or pneumothorax. Musculoskeletal: No chest wall abnormality. No acute osseous findings. CT ABDOMEN PELVIS FINDINGS Hepatobiliary: Atrophy of the left lobe of the liver. Unchanged hypodense lesion of the superior left lobe of the liver, hepatic segment IVA, abutting the vena cava and diaphragm measuring 3.6 x 2.1 cm (series 2, image 49). Unchanged very subtle hypodense lesion of the central liver adjacent to the gallbladder fossa measuring 2.1 x 1.8 cm (series 2, image 57). Small  gallstone. No gallbladder wall thickening or biliary ductal dilatation. Pancreas: Unremarkable. No pancreatic ductal dilatation or surrounding inflammatory changes. Spleen: Spleen is again enlarged by numerous lesions of varying attenuation, large, somewhat  ill-defined lesion centrally unchanged at 12.0 x 9.5 cm (series 2, image 58) Adrenals/Urinary Tract: Adrenal glands are unremarkable. Status post left nephrectomy. Right kidney is normal, without renal calculi, solid lesion, or hydronephrosis. Unchanged mild bladder wall thickening. Stomach/Bowel: Stomach is within normal limits. Appendix appears normal. No evidence of bowel wall thickening, distention, or inflammatory changes. Status post sigmoid colon resection and reanastomosis. Vascular/Lymphatic: Aortic atherosclerosis. No enlarged abdominal or pelvic lymph nodes. Reproductive: Prostatomegaly. Other: No abdominal wall hernia or abnormality. No ascites. Musculoskeletal: No acute osseous findings. Slightly increased sclerosis of widespread osseous metastatic disease involving the included axial and proximal appendicular skeleton, for example with new sclerotic lesions of the sternum and manubrium (series 6, image 74) and increasingly confluent sclerotic metastases throughout the pelvis. IMPRESSION: 1. Slightly increased sclerosis of widespread osseous metastatic disease involving the included axial and proximal appendicular skeleton, for example with new sclerotic lesions of the sternum and manubrium and increasingly confluent sclerotic metastases throughout the pelvis, findings most likely reflecting sclerotic treatment response rather than progression of disease. 2. Unchanged hypodense lesions liver lesions. Attention on follow-up. 3. 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 Attention on follow-up. 4. Unchanged small bilateral pulmonary nodules most likely infectious or inflammatory. Attention on follow-up. 5. Status post sigmoid colon resection and reanastomosis. 6. Status post left nephrectomy. 7. Cholelithiasis Aortic Atherosclerosis (ICD10-I70.0).  Electronically Signed   By: Marolyn JONETTA Jaksch M.D.   On: 04/06/2023 10:03    Medications: I have reviewed the patient's current medications.   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, status post a left colectomy 03/04/2019 Mass felt to be in the transverse colon on a colonoscopy 01/07/2019 with a biopsy confirming poorly differentiated adenocarcinoma with signet ring cell features, mismatch repair protein expression normal CT abdomen/pelvis 01/08/2019 12.3 cm indeterminate splenic mass, present in 2015 on a lumbar MRI-felt to be benign, small subpleural and pleural-based nodules at the lung bases, status post left nephrectomy CT chest 01/20/2019-small bilateral pulmonary nodules with rounded parenchymal nodules in the right lower lobe, indeterminate splenic mass CT chest 03/18/2019-multiple subcentimeter pulmonary nodules again seen bilaterally, greatest in the lower lobes, without significant change.  No new or enlarging pulmonary nodules or masses.  Partially visualized large heterogeneous splenic mass with no significant change.  Plan for follow-up chest CT at a 62-month interval. Cycle 1 FOLFOX 03/21/2019 Cycle 2 FOLFOX 04/04/2019 Cycle 3 FOLFOX 04/18/2019 (oxaliplatin  dose reduced secondary to thrombocytopenia) Cycle 4 FOLFOX 05/02/2019 (oxaliplatin  held secondary to thrombocytopenia) Cycle 5 FOLFOX 05/16/2019 Cycle 6 FOLFOX 05/30/2019 (oxaliplatin  held secondary to thrombocytopenia) CT chest 06/08/2019-stable scattered small solid pulmonary nodules  Cycle 7 FOLFOX 06/13/2019 Cycle 8 FOLFOX 06/27/2019 Cycle 9 FOLFOX 07/11/2019 Cycle 10 FOLFOX 07/25/2019 (oxaliplatin  held due to thrombocytopenia) Cycle 11 FOLFOX 08/08/2019 Cycle 12 FOLFOX 08/22/2019 CTs 01/09/2020-no evidence of recurrent disease, stable lung nodules favored to represent subpleural lymph nodes-dedicated follow-up not  recommended CTs 01/11/2021-no evidence of recurrent disease CTs 01/24/2022-no evidence of recurrent disease CT lumbar spine, abdomen/pelvis 04/11/2022-numerous mixed lytic and sclerotic lesions scattered throughout the visualized axial and appendicular skeleton including several areas in the lumbar spine.  04/21/2022 CT biopsy sclerotic lesion right anterior iliac bone-consistent with metastatic colorectal adenocarcinoma, moderate to poorly differentiated.  Foundation 1-microsatellite stable, tumor mutation burden 4, K-ras G12S PET scan 05/09/2022-widespread hypermetabolic mixed faintly lytic and sclerotic osseous metastases throughout the axial and proximal appendicular skeleton; associated mild pathologic compression fracture at L3; no hypermetabolic extraosseous metastatic disease. Cycle 1 FOLFIRI/bevacizumab  05/26/2022 Cycle 2 FOLFIRI/bevacizumab  06/09/2022 Cycle 3 FOLFIRI 06/24/2022, bevacizumab  06/26/2022 Cycle 4 FOLFIRI 07/08/2022, bevacizumab  held due to proteinuria Cycle 5 FOLFIRI 07/22/2022, bevacizumab  held due to proteinuria CTs 07/31/2022-potentially areas of sclerosis represent interval healing; new lytic lesion at T12, multiple new areas of punched-out sclerosis throughout the visualized osseous structures.  Index lesion in the left iliac wing measures 1.3 x 2.8 cm.  Healing of previously metabolically occult metastatic disease on 05/09/2022 is a possibility. Cycle 6 FOLFIRI 08/04/2022, irinotecan  dose reduced due to thrombocytopenia, bevacizumab  held pending 24-hour urine result Cycle 7 FOLFIRI 08/18/2022, bevacizumab  held secondary to proteinuria, G-CSF held Cycle 8 FOLFIRI 09/01/2022, bevacizumab  held, G-CSF Cycle 9 FOLFIRI 09/16/2022, bevacizumab  held, G-CSF CTs 09/29/2022-enlargement of a hypodense lesion posterior left lobe of the liver, similar diffuse mixed lytic and sclerotic osseous metastatic disease, multiple new small nodules medial right lung base, multiple other small bilateral pulmonary  nodules unchanged. Cycle 10 FOLFIRI plus Avastin  10/08/2022 Cycle 11 FOLFIRI 10/27/2022, Avastin  held Cycle 12 FOLFIRI/Avastin  11/11/2022 Cycle 13 FOLFIRI/Avastin  11/25/2022 CTs 12/08/2022-stable lesion posterior left lobe of the liver.  Unchanged diffuse lytic and sclerotic osseous metastatic disease.  Stable small solid pulmonary nodules.  Stable splenic lesions.  Slightly increased bladder wall thickening. Cycle 14 FOLFIRI/Avastin  12/10/2022 Cycle 15 FOLFIRI/Avastin  12/24/2022 12/31/2022 24-hour urine protein 710 mg Cycle 16 FOLFIRI/Avastin  01/07/2023 Cycle 17 FOLFIRI 01/21/2023, Avastin  held secondary to persistent proteinuria, slight increase in creatinine, and drainage from the suprapubic lesion CTs 01/26/2023-unchanged widespread sclerotic osseous metastatic disease, unchanged hypodense liver lesions, occasional small bilateral lung nodules unchanged. Cycle 18 FOLFIRI 02/03/2023, Avastin  held Cycle 19 FOLFIRI 02/17/2023, Avastin  held Cycle 20 FOLFIRI 03/03/2023, Avastin  held Cycle 21 FOLFIRI 03/16/2023, Avastin  held CTs 04/04/2023: Right increased sclerosis of osseous metastatic disease with new sclerotic lesions of the sternum/manubrium and increase sclerotic metastases throughout the pelvis, unchanged hypodense liver lesions Cycle 22 FOLFIRI 04/07/2023, Avastin  held   Multiple colon polyps-ascending colon polyps on the colonoscopy 01/07/2019-not removed Colonoscopy 01/03/2020-multiple polyps removed, tubular adenomas, inflammatory and hyperplastic polyps Wilms tumor at age 38, status post chemotherapy/radiation and a nephrectomy in [place]  Hurthle cell adenomas, 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  Left nephrectomy at age 30 12.  Port-A-Cath placement, Dr. Sheldon, 03/16/2019 13.  Thrombocytopenia secondary to  chemotherapy-oxaliplatin  dose reduced beginning with cycle 3 FOLFOX, improved 14.  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  Right groin soft tissue infection May 2023-status postsurgical debridement 18.  Left leg DVT-common femoral, femoral, popliteal, posterior tibial, and peroneal veins 19.  02/04/2022-MRI cervical spine-1.7 cm T1 lesion-indeterminate; bone scan 03/14/2022-focal intense activity at approximate T1 level felt to correspond to the lesion on MRI, additional foci of activity involving the lower thoracic and lumbar spine, right iliac bone and pubic symphysis.  CT lumbar spine, abdomen/pelvis 04/11/2022-numerous mixed lytic and sclerotic lesions scattered throughout the visualized axial and appendicular skeleton including several areas in  the lumbar spine.  04/21/2022 CT biopsy sclerotic lesion right anterior iliac bone-consistent with metastatic colorectal adenocarcinoma, moderate to poorly differentiated 20.  Inflamed sebaceous cyst 08/18/2022-doxycycline  Surgical resection of area of hidradenitis of 19 2024      Disposition: [name] has metastatic colon cancer.  He is being treated with FOLFIRI.  Avastin  has been on hold secondary to a draining groin lesion.  The lesion has not been resected.  The plan is to resume Avastin  when he returns in 2 weeks.  He will also resume Zometa  for prophylaxis in the setting of multiple bone metastases.  He has increased pain.  I reviewed the CT findings and images with [name] and [name].  There are increased sclerotic lesions which could indicate healing, but I am concerned the CT findings represent disease progression based on David pain and the elevated alkaline phosphatase.  The bone lesions do not appear to be related to prostate cancer as he has early stage  untreated prostate cancer and biopsy of iliac lesion revealed metastatic colon cancer.  The CEA has been stable over the past few months.  He will continue FOLFIRI.  He will continue the current pain regimen.  He will let me know if the pain is not relieved with David current medications.  He will return for an office visit in FOLFIRI in 2 weeks.  I will contact [name] to see whether he will qualify for clinical trial.  David Hof, MD  04/07/2023  10:27 AM

## 2023-04-07 NOTE — Patient Instructions (Addendum)
 [facility] A DEPT OF [facility]  [facility]   The chemotherapy medication bag should finish at 46 hours, 96 hours, or 7 days. For example, if your pump is scheduled for 46 hours and it was put on at 4:00 p.m., it should finish at 2:00 p.m. the day it is scheduled to come off regardless of your appointment time.     Estimated time to finish at 12:00 THURSDAY, [name], 9, 2024.   If the display on your pump reads Low Volume and it is beeping, take the batteries out of the pump and come to the cancer center for it to be taken off.   If the pump alarms go off prior to the pump reading Low Volume then call [phone] and someone can assist you.  If the plunger comes out and the chemotherapy medication is leaking out, please use your home chemo spill kit to clean up the spill. Do NOT use paper towels or other household products.  If you have problems or questions regarding your pump, please call either [phone] (24 hours a day) or the cancer center Monday-Friday 8:00 a.m.- 4:30 p.m. at the clinic number and we will assist you. If you are unable to get assistance, then go to the nearest Emergency Department and ask the staff to contact the IV team for assistance.  Discharge Instructions: Thank you for choosing [facility] to provide your oncology and hematology care.   If you have a lab appointment with the Cancer Center, please go directly to the Cancer Center and check in at the registration area.   Wear comfortable clothing and clothing appropriate for easy access to any Portacath or PICC line.   We strive to give you quality time with your provider. You may need to reschedule your appointment if you arrive late (15 or more minutes).  Arriving late affects you and other patients whose appointments are after yours.  Also, if you miss three or more appointments without notifying the office, you may be dismissed from the clinic at the provider's  discretion.      For prescription refill requests, have your pharmacy contact our office and allow 72 hours for refills to be completed.    Today you received the following chemotherapy and/or immunotherapy agents IRINOTECAN , LEUCOVORIN /FLUOROURACIL        To help prevent nausea and vomiting after your treatment, we encourage you to take your nausea medication as directed.  BELOW ARE SYMPTOMS THAT SHOULD BE REPORTED IMMEDIATELY: *FEVER GREATER THAN 100.4 F (38 C) OR HIGHER *CHILLS OR SWEATING *NAUSEA AND VOMITING THAT IS NOT CONTROLLED WITH YOUR NAUSEA MEDICATION *UNUSUAL SHORTNESS OF BREATH *UNUSUAL BRUISING OR BLEEDING *URINARY PROBLEMS (pain or burning when urinating, or frequent urination) *BOWEL PROBLEMS (unusual diarrhea, constipation, pain near the anus) TENDERNESS IN MOUTH AND THROAT WITH OR WITHOUT PRESENCE OF ULCERS (sore throat, sores in mouth, or a toothache) UNUSUAL RASH, SWELLING OR PAIN  UNUSUAL VAGINAL DISCHARGE OR ITCHING   Items with * indicate a potential emergency and should be followed up as soon as possible or go to the Emergency Department if any problems should occur.  Please show the CHEMOTHERAPY ALERT CARD or IMMUNOTHERAPY ALERT CARD at check-in to the Emergency Department and triage nurse.  Should you have questions after your visit or need to cancel or reschedule your appointment, please contact [facility] - A DEPT OF [facility]  Dept: [phone]  and follow the prompts.  Office  hours are 8:00 a.m. to 4:30 p.m. Monday - Friday. Please note that voicemails left after 4:00 p.m. may not be returned until the following business day.  We are closed weekends and major holidays. You have access to a nurse at all times for urgent questions. Please call the main number to the clinic Dept: [phone] and follow the prompts.   For any non-urgent questions, you may also contact your provider using MyChart. We now offer e-Visits for  anyone 82 and older to request care online for non-urgent symptoms. For details visit [email or web address].   Also download the MyChart app! Go to the app store, search MyChart, open the app, select [facility], and log in with your MyChart username and password.  Irinotecan  Injection What is this medication? IRINOTECAN  (ir in oh TEE kan) treats some types of cancer. It works by slowing down the growth of cancer cells. This medicine may be used for other purposes; ask your health care provider or pharmacist if you have questions. COMMON BRAND NAME(S): Camptosar  What should I tell my care team before I take this medication? They need to know if you have any of these conditions: Dehydration Diarrhea Infection, especially a viral infection, such as chickenpox, cold sores, herpes Liver disease Low blood cell levels (white cells, red cells, and platelets) Low levels of electrolytes, such as calcium , magnesium , or potassium in your blood Recent or ongoing radiation An unusual or allergic reaction to irinotecan , other medications, foods, dyes, or preservatives If you or your partner are pregnant or trying to get pregnant Breast-feeding How should I use this medication? This medication is injected into a vein. It is given by your care team in a hospital or clinic setting. Talk to your care team about the use of this medication in children. Special care may be needed. Overdosage: If you think you have taken too much of this medicine contact a poison control center or emergency room at once. NOTE: This medicine is only for you. Do not share this medicine with others. What if I miss a dose? Keep appointments for follow-up doses. It is important not to miss your dose. Call your care team if you are unable to keep an appointment. What may interact with this medication? Do not take this medication with any of the following: Cobicistat Itraconazole This medication may also interact with the  following: Certain antibiotics, such as clarithromycin, rifampin, rifabutin Certain antivirals for HIV or AIDS Certain medications for fungal infections, such as ketoconazole, posaconazole, voriconazole Certain medications for seizures, such as carbamazepine, phenobarbital, phenytoin  Gemfibrozil Nefazodone St. John's wort This list may not describe all possible interactions. Give your health care provider a list of all the medicines, herbs, non-prescription drugs, or dietary supplements you use. Also tell them if you smoke, drink alcohol, or use illegal drugs. Some items may interact with your medicine. What should I watch for while using this medication? Your condition will be monitored carefully while you are receiving this medication. You may need blood work while taking this medication. This medication may make you feel generally unwell. This is not uncommon as chemotherapy can affect healthy cells as well as cancer cells. Report any side effects. Continue your course of treatment even though you feel ill unless your care team tells you to stop. This medication can cause serious side effects. To reduce the risk, your care team may give you other medications to take before receiving this one. Be sure to follow the directions from your care team. This medication  may affect your coordination, reaction time, or judgement. Do not drive or operate machinery until you know how this medication affects you. Sit up or stand slowly to reduce the risk of dizzy or fainting spells. Drinking alcohol with this medication can increase the risk of these side effects. This medication may increase your risk of getting an infection. Call your care team for advice if you get a fever, chills, sore throat, or other symptoms of a cold or flu. Do not treat yourself. Try to avoid being around people who are sick. Avoid taking medications that contain aspirin , acetaminophen , ibuprofen, naproxen , or ketoprofen unless  instructed by your care team. These medications may hide a fever. This medication may increase your risk to bruise or bleed. Call your care team if you notice any unusual bleeding. Be careful brushing or flossing your teeth or using a toothpick because you may get an infection or bleed more easily. If you have any dental work done, tell your dentist you are receiving this medication. Talk to your care team if you or your partner are pregnant or think either of you might be pregnant. This medication can cause serious birth defects if taken during pregnancy and for 6 months after the last dose. You will need a negative pregnancy test before starting this medication. Contraception is recommended while taking this medication and for 6 months after the last dose. Your care team can help you find the option that works for you. Do not father a child while taking this medication and for 3 months after the last dose. Use a condom for contraception during this time period. Do not breastfeed while taking this medication and for 7 days after the last dose. This medication may cause infertility. Talk to your care team if you are concerned about your fertility. What side effects may I notice from receiving this medication? Side effects that you should report to your care team as soon as possible: Allergic reactions--skin rash, itching, hives, swelling of the face, lips, tongue, or throat Dry cough, shortness of breath or trouble breathing Increased saliva or tears, increased sweating, stomach cramping, diarrhea, small pupils, unusual weakness or fatigue, slow heartbeat Infection--fever, chills, cough, sore throat, wounds that don't heal, pain or trouble when passing urine, general feeling of discomfort or being unwell Kidney injury--decrease in the amount of urine, swelling of the ankles, hands, or feet Low red blood cell level--unusual weakness or fatigue, dizziness, headache, trouble breathing Severe or prolonged  diarrhea Unusual bruising or bleeding Side effects that usually do not require medical attention (report to your care team if they continue or are bothersome): Constipation Diarrhea Hair loss Loss of appetite Nausea Stomach pain This list may not describe all possible side effects. Call your doctor for medical advice about side effects. You may report side effects to FDA at 1-800-FDA-1088. Where should I keep my medication? This medication is given in a hospital or clinic. It will not be stored at home. NOTE: This sheet is a summary. It may not cover all possible information. If you have questions about this medicine, talk to your doctor, pharmacist, or health care provider.  2024 Elsevier/Gold Standard (2021-07-29 00:00:00)  Leucovorin  Injection What is this medication? LEUCOVORIN  (loo koe VOR in) prevents side effects from certain medications, such as methotrexate. It works by increasing folate levels. This helps protect healthy cells in your body. It may also be used to treat anemia caused by low levels of folate. It can also be used with fluorouracil , a  type of chemotherapy, to treat colorectal cancer. It works by increasing the effects of fluorouracil  in the body. This medicine may be used for other purposes; ask your health care provider or pharmacist if you have questions. What should I tell my care team before I take this medication? They need to know if you have any of these conditions: Anemia from low levels of vitamin B12 in the blood An unusual or allergic reaction to leucovorin , folic acid, other medications, foods, dyes, or preservatives Pregnant or trying to get pregnant Breastfeeding How should I use this medication? This medication is injected into a vein or a muscle. It is given by your care team in a hospital or clinic setting. Talk to your care team about the use of this medication in children. Special care may be needed. Overdosage: If you think you have taken too  much of this medicine contact a poison control center or emergency room at once. NOTE: This medicine is only for you. Do not share this medicine with others. What if I miss a dose? Keep appointments for follow-up doses. It is important not to miss your dose. Call your care team if you are unable to keep an appointment. What may interact with this medication? Capecitabine Fluorouracil  Phenobarbital Phenytoin  Primidone Trimethoprim;sulfamethoxazole This list may not describe all possible interactions. Give your health care provider a list of all the medicines, herbs, non-prescription drugs, or dietary supplements you use. Also tell them if you smoke, drink alcohol, or use illegal drugs. Some items may interact with your medicine. What should I watch for while using this medication? Your condition will be monitored carefully while you are receiving this medication. This medication may increase the side effects of 5-fluorouracil . Tell your care team if you have diarrhea or mouth sores that do not get better or that get worse. What side effects may I notice from receiving this medication? Side effects that you should report to your care team as soon as possible: Allergic reactions--skin rash, itching, hives, swelling of the face, lips, tongue, or throat This list may not describe all possible side effects. Call your doctor for medical advice about side effects. You may report side effects to FDA at 1-800-FDA-1088. Where should I keep my medication? This medication is given in a hospital or clinic. It will not be stored at home. NOTE: This sheet is a summary. It may not cover all possible information. If you have questions about this medicine, talk to your doctor, pharmacist, or health care provider.  2024 Elsevier/Gold Standard (2021-08-20 00:00:00)  Fluorouracil  Injection What is this medication? FLUOROURACIL  (flure oh YOOR a sil) treats some types of cancer. It works by slowing down the  growth of cancer cells. This medicine may be used for other purposes; ask your health care provider or pharmacist if you have questions. COMMON BRAND NAME(S): Adrucil  What should I tell my care team before I take this medication? They need to know if you have any of these conditions: Blood disorders Dihydropyrimidine dehydrogenase (DPD) deficiency Infection, such as chickenpox, cold sores, herpes Kidney disease Liver disease Poor nutrition Recent or ongoing radiation therapy An unusual or allergic reaction to fluorouracil , other medications, foods, dyes, or preservatives If you or your partner are pregnant or trying to get pregnant Breast-feeding How should I use this medication? This medication is injected into a vein. It is administered by your care team in a hospital or clinic setting. Talk to your care team about the use of this medication in children.  Special care may be needed. Overdosage: If you think you have taken too much of this medicine contact a poison control center or emergency room at once. NOTE: This medicine is only for you. Do not share this medicine with others. What if I miss a dose? Keep appointments for follow-up doses. It is important not to miss your dose. Call your care team if you are unable to keep an appointment. What may interact with this medication? Do not take this medication with any of the following: Live virus vaccines This medication may also interact with the following: Medications that treat or prevent blood clots, such as warfarin, enoxaparin , dalteparin This list may not describe all possible interactions. Give your health care provider a list of all the medicines, herbs, non-prescription drugs, or dietary supplements you use. Also tell them if you smoke, drink alcohol, or use illegal drugs. Some items may interact with your medicine. What should I watch for while using this medication? Your condition will be monitored carefully while you are  receiving this medication. This medication may make you feel generally unwell. This is not uncommon as chemotherapy can affect healthy cells as well as cancer cells. Report any side effects. Continue your course of treatment even though you feel ill unless your care team tells you to stop. In some cases, you may be given additional medications to help with side effects. Follow all directions for their use. This medication may increase your risk of getting an infection. Call your care team for advice if you get a fever, chills, sore throat, or other symptoms of a cold or flu. Do not treat yourself. Try to avoid being around people who are sick. This medication may increase your risk to bruise or bleed. Call your care team if you notice any unusual bleeding. Be careful brushing or flossing your teeth or using a toothpick because you may get an infection or bleed more easily. If you have any dental work done, tell your dentist you are receiving this medication. Avoid taking medications that contain aspirin , acetaminophen , ibuprofen, naproxen , or ketoprofen unless instructed by your care team. These medications may hide a fever. Do not treat diarrhea with over the counter products. Contact your care team if you have diarrhea that lasts more than 2 days or if it is severe and watery. This medication can make you more sensitive to the sun. Keep out of the sun. If you cannot avoid being in the sun, wear protective clothing and sunscreen. Do not use sun lamps, tanning beds, or tanning booths. Talk to your care team if you or your partner wish to become pregnant or think you might be pregnant. This medication can cause serious birth defects if taken during pregnancy and for 3 months after the last dose. A reliable form of contraception is recommended while taking this medication and for 3 months after the last dose. Talk to your care team about effective forms of contraception. Do not father a child while taking  this medication and for 3 months after the last dose. Use a condom while having sex during this time period. Do not breastfeed while taking this medication. This medication may cause infertility. Talk to your care team if you are concerned about your fertility. What side effects may I notice from receiving this medication? Side effects that you should report to your care team as soon as possible: Allergic reactions--skin rash, itching, hives, swelling of the face, lips, tongue, or throat Heart attack--pain or tightness in the chest,  shoulders, arms, or jaw, nausea, shortness of breath, cold or clammy skin, feeling faint or lightheaded Heart failure--shortness of breath, swelling of the ankles, feet, or hands, sudden weight gain, unusual weakness or fatigue Heart rhythm changes--fast or irregular heartbeat, dizziness, feeling faint or lightheaded, chest pain, trouble breathing High ammonia level--unusual weakness or fatigue, confusion, loss of appetite, nausea, vomiting, seizures Infection--fever, chills, cough, sore throat, wounds that don't heal, pain or trouble when passing urine, general feeling of discomfort or being unwell Low red blood cell level--unusual weakness or fatigue, dizziness, headache, trouble breathing Pain, tingling, or numbness in the hands or feet, muscle weakness, change in vision, confusion or trouble speaking, loss of balance or coordination, trouble walking, seizures Redness, swelling, and blistering of the skin over hands and feet Severe or prolonged diarrhea Unusual bruising or bleeding Side effects that usually do not require medical attention (report to your care team if they continue or are bothersome): Dry skin Headache Increased tears Nausea Pain, redness, or swelling with sores inside the mouth or throat Sensitivity to light Vomiting This list may not describe all possible side effects. Call your doctor for medical advice about side effects. You may report  side effects to FDA at 1-800-FDA-1088. Where should I keep my medication? This medication is given in a hospital or clinic. It will not be stored at home. NOTE: This sheet is a summary. It may not cover all possible information. If you have questions about this medicine, talk to your doctor, pharmacist, or health care provider.  2024 Elsevier/Gold Standard (2021-07-23 00:00:00)

## 2023-04-07 NOTE — Progress Notes (Signed)
 Scheduling msg left per [name] to add Zometa to pump d/c on 01/09

## 2023-04-07 NOTE — Progress Notes (Signed)
 Patient seen by Dr. Arley Hof today  Vitals are within treatment parameters:Yes   Labs are within treatment parameters: No (Please specify and give further instructions.)  Glucose is 420, okay to tx per Dr. Hof.  Treatment plan has been signed: Yes   Per physician team, Patient is ready for treatment and there are NO modifications to the treatment plan.

## 2023-04-08 ENCOUNTER — Other Ambulatory Visit: Payer: Self-pay

## 2023-04-09 ENCOUNTER — Inpatient Hospital Stay: Payer: BC Managed Care – PPO

## 2023-04-09 VITALS — BP 127/81 | HR 89 | Temp 98.6°F | Resp 18

## 2023-04-09 DIAGNOSIS — Z5189 Encounter for other specified aftercare: Secondary | ICD-10-CM | POA: Diagnosis not present

## 2023-04-09 DIAGNOSIS — Z95828 Presence of other vascular implants and grafts: Secondary | ICD-10-CM

## 2023-04-09 DIAGNOSIS — C7951 Secondary malignant neoplasm of bone: Secondary | ICD-10-CM | POA: Diagnosis not present

## 2023-04-09 DIAGNOSIS — Z5111 Encounter for antineoplastic chemotherapy: Secondary | ICD-10-CM | POA: Diagnosis not present

## 2023-04-09 DIAGNOSIS — C787 Secondary malignant neoplasm of liver and intrahepatic bile duct: Secondary | ICD-10-CM | POA: Diagnosis not present

## 2023-04-09 DIAGNOSIS — C7801 Secondary malignant neoplasm of right lung: Secondary | ICD-10-CM | POA: Diagnosis not present

## 2023-04-09 DIAGNOSIS — C186 Malignant neoplasm of descending colon: Secondary | ICD-10-CM | POA: Diagnosis not present

## 2023-04-09 MED ORDER — SODIUM CHLORIDE 0.9 % IV SOLN: Freq: Once | INTRAVENOUS | Status: AC

## 2023-04-09 MED ORDER — HEPARIN SOD (PORK) LOCK FLUSH 100 UNIT/ML IV SOLN
500.0000 [IU] | Freq: Once | INTRAVENOUS | Status: AC | PRN
  Administered 2023-04-09: 500 [IU]

## 2023-04-09 MED ORDER — PEGFILGRASTIM INJECTION 6 MG/0.6ML ~~LOC~~
6.0000 mg | PREFILLED_SYRINGE | Freq: Once | SUBCUTANEOUS | Status: AC
  Administered 2023-04-09: 6 mg via SUBCUTANEOUS
  Filled 2023-04-09: qty 0.6

## 2023-04-09 MED ORDER — ZOLEDRONIC ACID 4 MG/100ML IV SOLN
4.0000 mg | Freq: Once | INTRAVENOUS | Status: AC
Start: 2023-04-09 — End: 2023-04-09
  Administered 2023-04-09: 4 mg via INTRAVENOUS
  Filled 2023-04-09: qty 100

## 2023-04-09 MED ORDER — SODIUM CHLORIDE 0.9% FLUSH
10.0000 mL | INTRAVENOUS | Status: DC | PRN
  Administered 2023-04-09: 10 mL

## 2023-04-09 NOTE — Patient Instructions (Signed)

## 2023-04-19 ENCOUNTER — Other Ambulatory Visit: Payer: Self-pay | Admitting: Oncology

## 2023-04-22 ENCOUNTER — Inpatient Hospital Stay: Payer: BC Managed Care – PPO

## 2023-04-22 ENCOUNTER — Other Ambulatory Visit (HOSPITAL_BASED_OUTPATIENT_CLINIC_OR_DEPARTMENT_OTHER): Payer: Self-pay

## 2023-04-22 ENCOUNTER — Encounter: Payer: Self-pay | Admitting: Nurse Practitioner

## 2023-04-22 ENCOUNTER — Inpatient Hospital Stay: Payer: BC Managed Care – PPO | Admitting: Nurse Practitioner

## 2023-04-22 VITALS — BP 153/90 | HR 74 | Resp 18

## 2023-04-22 VITALS — BP 139/86 | HR 98 | Temp 98.1°F | Resp 18 | Ht 72.0 in | Wt 197.5 lb

## 2023-04-22 DIAGNOSIS — Z5189 Encounter for other specified aftercare: Secondary | ICD-10-CM | POA: Diagnosis not present

## 2023-04-22 DIAGNOSIS — C186 Malignant neoplasm of descending colon: Secondary | ICD-10-CM

## 2023-04-22 DIAGNOSIS — C189 Malignant neoplasm of colon, unspecified: Secondary | ICD-10-CM

## 2023-04-22 DIAGNOSIS — C7951 Secondary malignant neoplasm of bone: Secondary | ICD-10-CM | POA: Diagnosis not present

## 2023-04-22 DIAGNOSIS — C787 Secondary malignant neoplasm of liver and intrahepatic bile duct: Secondary | ICD-10-CM | POA: Diagnosis not present

## 2023-04-22 DIAGNOSIS — Z5111 Encounter for antineoplastic chemotherapy: Secondary | ICD-10-CM | POA: Diagnosis not present

## 2023-04-22 DIAGNOSIS — C7801 Secondary malignant neoplasm of right lung: Secondary | ICD-10-CM | POA: Diagnosis not present

## 2023-04-22 LAB — CBC WITH DIFFERENTIAL (CANCER CENTER ONLY)
Abs Immature Granulocytes: 0.9 10*3/uL — ABNORMAL HIGH (ref 0.00–0.07)
Band Neutrophils: 9 %
Basophils Absolute: 0 10*3/uL (ref 0.0–0.1)
Basophils Relative: 0 %
Eosinophils Absolute: 0.8 10*3/uL — ABNORMAL HIGH (ref 0.0–0.5)
Eosinophils Relative: 5 %
HCT: 33.5 % — ABNORMAL LOW (ref 39.0–52.0)
Hemoglobin: 11 g/dL — ABNORMAL LOW (ref 13.0–17.0)
Lymphocytes Relative: 8 %
Lymphs Abs: 1.2 10*3/uL (ref 0.7–4.0)
MCH: 32.8 pg (ref 26.0–34.0)
MCHC: 32.8 g/dL (ref 30.0–36.0)
MCV: 100 fL (ref 80.0–100.0)
Metamyelocytes Relative: 1 %
Monocytes Absolute: 0.5 10*3/uL (ref 0.1–1.0)
Monocytes Relative: 3 %
Myelocytes: 5 %
Neutro Abs: 12 10*3/uL — ABNORMAL HIGH (ref 1.7–7.7)
Neutrophils Relative %: 69 %
Platelet Count: 94 10*3/uL — ABNORMAL LOW (ref 150–400)
RBC: 3.35 MIL/uL — ABNORMAL LOW (ref 4.22–5.81)
RDW: 16.6 % — ABNORMAL HIGH (ref 11.5–15.5)
WBC Count: 15.4 10*3/uL — ABNORMAL HIGH (ref 4.0–10.5)
nRBC: 0.3 % — ABNORMAL HIGH (ref 0.0–0.2)

## 2023-04-22 LAB — CMP (CANCER CENTER ONLY)
ALT: 9 U/L (ref 0–44)
AST: 25 U/L (ref 15–41)
Albumin: 3.6 g/dL (ref 3.5–5.0)
Alkaline Phosphatase: 1177 U/L — ABNORMAL HIGH (ref 38–126)
Anion gap: 6 (ref 5–15)
BUN: 13 mg/dL (ref 6–20)
CO2: 27 mmol/L (ref 22–32)
Calcium: 8.3 mg/dL — ABNORMAL LOW (ref 8.9–10.3)
Chloride: 100 mmol/L (ref 98–111)
Creatinine: 0.81 mg/dL (ref 0.61–1.24)
GFR, Estimated: >60 mL/min (ref >60)
Glucose, Bld: 302 mg/dL — ABNORMAL HIGH (ref 70–99)
Potassium: 4.5 mmol/L (ref 3.5–5.1)
Sodium: 133 mmol/L — ABNORMAL LOW (ref 135–145)
Total Bilirubin: 0.7 mg/dL (ref 0.0–1.2)
Total Protein: 6.3 g/dL — ABNORMAL LOW (ref 6.5–8.1)

## 2023-04-22 LAB — CEA (ACCESS): CEA (CHCC): 21.11 ng/mL — ABNORMAL HIGH (ref 0.00–5.00)

## 2023-04-22 LAB — TOTAL PROTEIN, URINE DIPSTICK: Protein, ur: 100 mg/dL — AB

## 2023-04-22 MED ORDER — SODIUM CHLORIDE 0.9 % IV SOLN
140.0000 mg/m2 | Freq: Once | INTRAVENOUS | Status: AC
  Administered 2023-04-22: 300 mg via INTRAVENOUS
  Filled 2023-04-22: qty 15

## 2023-04-22 MED ORDER — PALONOSETRON HCL INJECTION 0.25 MG/5ML
0.2500 mg | Freq: Once | INTRAVENOUS | Status: AC
  Administered 2023-04-22: 0.25 mg via INTRAVENOUS
  Filled 2023-04-22: qty 5

## 2023-04-22 MED ORDER — SODIUM CHLORIDE 0.9 % IV SOLN
2400.0000 mg/m2 | INTRAVENOUS | Status: DC
  Administered 2023-04-22: 5000 mg via INTRAVENOUS
  Filled 2023-04-22: qty 100

## 2023-04-22 MED ORDER — OXYCODONE HCL 5 MG PO TABS
5.0000 mg | ORAL_TABLET | Freq: Four times a day (QID) | ORAL | 0 refills | Status: DC | PRN
  Filled 2023-04-22: qty 30, 4d supply, fill #0

## 2023-04-22 MED ORDER — FLUOROURACIL CHEMO INJECTION 2.5 GM/50ML
400.0000 mg/m2 | Freq: Once | INTRAVENOUS | Status: AC
  Administered 2023-04-22: 850 mg via INTRAVENOUS
  Filled 2023-04-22: qty 17

## 2023-04-22 MED ORDER — SODIUM CHLORIDE 0.9 % IV SOLN: Freq: Once | INTRAVENOUS | Status: AC

## 2023-04-22 MED ORDER — DEXAMETHASONE SODIUM PHOSPHATE 10 MG/ML IJ SOLN
10.0000 mg | Freq: Once | INTRAMUSCULAR | Status: AC
  Administered 2023-04-22: 10 mg via INTRAVENOUS
  Filled 2023-04-22: qty 1

## 2023-04-22 MED ORDER — SODIUM CHLORIDE 0.9 % IV SOLN
5.0000 mg/kg | Freq: Once | INTRAVENOUS | Status: AC
  Administered 2023-04-22: 450 mg via INTRAVENOUS
  Filled 2023-04-22: qty 16

## 2023-04-22 MED ORDER — ATROPINE SULFATE 1 MG/ML IV SOLN
0.5000 mg | Freq: Once | INTRAVENOUS | Status: AC | PRN
  Administered 2023-04-22: 0.5 mg via INTRAVENOUS
  Filled 2023-04-22: qty 1

## 2023-04-22 MED ORDER — SODIUM CHLORIDE 0.9 % IV SOLN
400.0000 mg/m2 | Freq: Once | INTRAVENOUS | Status: AC
  Administered 2023-04-22: 852 mg via INTRAVENOUS
  Filled 2023-04-22: qty 42.6

## 2023-04-22 NOTE — Progress Notes (Signed)
Patient seen by Lonna Cobb NP today  Vitals are within treatment parameters:Yes   Labs are within treatment parameters: No (Please specify and give further instructions.) alkaline Phosphatase 1,177 protein urine 100  Treatment plan has been signed: No   Per physician team, Patient is ready for treatment. Please note the following modifications:

## 2023-04-22 NOTE — Patient Instructions (Signed)
[facility]   Discharge Instructions: Thank you for choosing [facility] to provide your oncology and hematology care.   If you have a lab appointment with the Cancer Center, please go directly to the Cancer Center and check in at the registration area.   Wear comfortable clothing and clothing appropriate for easy access to any Portacath or PICC line.   We strive to give you quality time with your provider. You may need to reschedule your appointment if you arrive late (15 or more minutes).  Arriving late affects you and other patients whose appointments are after yours.  Also, if you miss three or more appointments without notifying the office, you may be dismissed from the clinic at the provider's discretion.      For prescription refill requests, have your pharmacy contact our office and allow 72 hours for refills to be completed.    Today you received the following chemotherapy and/or immunotherapy agents Bevacizumab-awwb (MVASI), Irinotecan (CAMPTOSAR), Leucovorin & Flourouracil (ADRUCIL).      To help prevent nausea and vomiting after your treatment, we encourage you to take your nausea medication as directed.  BELOW ARE SYMPTOMS THAT SHOULD BE REPORTED IMMEDIATELY: *FEVER GREATER THAN 100.4 F (38 C) OR HIGHER *CHILLS OR SWEATING *NAUSEA AND VOMITING THAT IS NOT CONTROLLED WITH YOUR NAUSEA MEDICATION *UNUSUAL SHORTNESS OF BREATH *UNUSUAL BRUISING OR BLEEDING *URINARY PROBLEMS (pain or burning when urinating, or frequent urination) *BOWEL PROBLEMS (unusual diarrhea, constipation, pain near the anus) TENDERNESS IN MOUTH AND THROAT WITH OR WITHOUT PRESENCE OF ULCERS (sore throat, sores in mouth, or a toothache) UNUSUAL RASH, SWELLING OR PAIN  UNUSUAL VAGINAL DISCHARGE OR ITCHING   Items with * indicate a potential emergency and should be followed up as soon as possible or go to the Emergency Department if any  problems should occur.  Please show the CHEMOTHERAPY ALERT CARD or IMMUNOTHERAPY ALERT CARD at check-in to the Emergency Department and triage nurse.  Should you have questions after your visit or need to cancel or reschedule your appointment, please contact [facility]  Dept: [phone]  and follow the prompts.  Office hours are 8:00 a.m. to 4:30 p.m. Monday - Friday. Please note that voicemails left after 4:00 p.m. may not be returned until the following business day.  We are closed weekends and major holidays. You have access to a nurse at all times for urgent questions. Please call the main number to the clinic Dept: [phone] and follow the prompts.   For any non-urgent questions, you may also contact your provider using MyChart. We now offer e-Visits for anyone 75 and older to request care online for non-urgent symptoms. For details visit [email or web address].   Also download the MyChart app! Go to the app store, search "MyChart", open the app, select , and log in with your MyChart username and password.  Bevacizumab Injection What is this medication? BEVACIZUMAB (be va SIZ yoo mab) treats some types of cancer. It works by blocking a protein that causes cancer cells to grow and multiply. This helps to slow or stop the spread of cancer cells. It is a monoclonal antibody. This medicine may be used for other purposes; ask your health care provider or pharmacist if you have questions. COMMON BRAND NAME(S): Alymsys, Avastin, MVASI, Ros[place] Lao What should I tell my care team before I take this medication? They need  to know if you have any of these conditions: Blood clots Coughing up blood Having or recent surgery Heart failure High blood pressure History of a connection between 2 or more body parts that do not usually connect (fistula) History of a tear in your stomach or intestines Protein in your urine An  unusual or allergic reaction to bevacizumab, other medications, foods, dyes, or preservatives Pregnant or trying to get pregnant Breast-feeding How should I use this medication? This medication is injected into a vein. It is given by your care team in a hospital or clinic setting. Talk to your care team the use of this medication in children. Special care may be needed. Overdosage: If you think you have taken too much of this medicine contact a poison control center or emergency room at once. NOTE: This medicine is only for you. Do not share this medicine with others. What if I miss a dose? Keep appointments for follow-up doses. It is important not to miss your dose. Call your care team if you are unable to keep an appointment. What may interact with this medication? Interactions are not expected. This list may not describe all possible interactions. Give your health care provider a list of all the medicines, herbs, non-prescription drugs, or dietary supplements you use. Also tell them if you smoke, drink alcohol, or use illegal drugs. Some items may interact with your medicine. What should I watch for while using this medication? Your condition will be monitored carefully while you are receiving this medication. You may need blood work while taking this medication. This medication may make you feel generally unwell. This is not uncommon as chemotherapy can affect healthy cells as well as cancer cells. Report any side effects. Continue your course of treatment even though you feel ill unless your care team tells you to stop. This medication may increase your risk to bruise or bleed. Call your care team if you notice any unusual bleeding. Before having surgery, talk to your care team to make sure it is ok. This medication can increase the risk of poor healing of your surgical site or wound. You will need to stop this medication for 28 days before surgery. After surgery, wait at least 28 days before  restarting this medication. Make sure the surgical site or wound is healed enough before restarting this medication. Talk to your care team if questions. Talk to your care team if you may be pregnant. Serious birth defects can occur if you take this medication during pregnancy and for 6 months after the last dose. Contraception is recommended while taking this medication and for 6 months after the last dose. Your care team can help you find the option that works for you. Do not breastfeed while taking this medication and for 6 months after the last dose. This medication can cause infertility. Talk to your care team if you are concerned about your fertility. What side effects may I notice from receiving this medication? Side effects that you should report to your care team as soon as possible: Allergic reactions--skin rash, itching, hives, swelling of the face, lips, tongue, or throat Bleeding--bloody or black, tar-like stools, vomiting blood or brown material that looks like coffee grounds, red or dark brown urine, small red or purple spots on skin, unusual bruising or bleeding Blood clot--pain, swelling, or warmth in the leg, shortness of breath, chest pain Heart attack--pain or tightness in the chest, shoulders, arms, or jaw, nausea, shortness of breath, cold or clammy skin, feeling faint  or lightheaded Heart failure--shortness of breath, swelling of the ankles, feet, or hands, sudden weight gain, unusual weakness or fatigue Increase in blood pressure Infection--fever, chills, cough, sore throat, wounds that don't heal, pain or trouble when passing urine, general feeling of discomfort or being unwell Infusion reactions--chest pain, shortness of breath or trouble breathing, feeling faint or lightheaded Kidney injury--decrease in the amount of urine, swelling of the ankles, hands, or feet Stomach pain that is severe, does not go away, or gets worse Stroke--sudden numbness or weakness of the face,  arm, or leg, trouble speaking, confusion, trouble walking, loss of balance or coordination, dizziness, severe headache, change in vision Sudden and severe headache, confusion, change in vision, seizures, which may be signs of posterior reversible encephalopathy syndrome (PRES) Side effects that usually do not require medical attention (report to your care team if they continue or are bothersome): Back pain Change in taste Diarrhea Dry skin Increased tears Nosebleed This list may not describe all possible side effects. Call your doctor for medical advice about side effects. You may report side effects to FDA at 1-800-FDA-1088. Where should I keep my medication? This medication is given in a hospital or clinic. It will not be stored at home. NOTE: This sheet is a summary. It may not cover all possible information. If you have questions about this medicine, talk to your doctor, pharmacist, or health care provider.  2024 Elsevier/Gold Standard (2021-08-02 00:00:00)  Irinotecan Injection What is this medication? IRINOTECAN (ir in oh TEE kan) treats some types of cancer. It works by slowing down the growth of cancer cells. This medicine may be used for other purposes; ask your health care provider or pharmacist if you have questions. COMMON BRAND NAME(S): Camptosar What should I tell my care team before I take this medication? They need to know if you have any of these conditions: Dehydration Diarrhea Infection, especially a viral infection, such as chickenpox, cold sores, herpes Liver disease Low blood cell levels (white cells, red cells, and platelets) Low levels of electrolytes, such as calcium, magnesium, or potassium in your blood Recent or ongoing radiation An unusual or allergic reaction to irinotecan, other medications, foods, dyes, or preservatives If you or your partner are pregnant or trying to get pregnant Breast-feeding How should I use this medication? This medication is  injected into a vein. It is given by your care team in a hospital or clinic setting. Talk to your care team about the use of this medication in children. Special care may be needed. Overdosage: If you think you have taken too much of this medicine contact a poison control center or emergency room at once. NOTE: This medicine is only for you. Do not share this medicine with others. What if I miss a dose? Keep appointments for follow-up doses. It is important not to miss your dose. Call your care team if you are unable to keep an appointment. What may interact with this medication? Do not take this medication with any of the following: Cobicistat Itraconazole This medication may also interact with the following: Certain antibiotics, such as clarithromycin, rifampin, rifabutin Certain antivirals for HIV or AIDS Certain medications for fungal infections, such as ketoconazole, posaconazole, voriconazole Certain medications for seizures, such as carbamazepine, phenobarbital, phenytoin Gemfibrozil Nefazodone St. John's wort This list may not describe all possible interactions. Give your health care provider a list of all the medicines, herbs, non-prescription drugs, or dietary supplements you use. Also tell them if you smoke, drink alcohol, or use  illegal drugs. Some items may interact with your medicine. What should I watch for while using this medication? Your condition will be monitored carefully while you are receiving this medication. You may need blood work while taking this medication. This medication may make you feel generally unwell. This is not uncommon as chemotherapy can affect healthy cells as well as cancer cells. Report any side effects. Continue your course of treatment even though you feel ill unless your care team tells you to stop. This medication can cause serious side effects. To reduce the risk, your care team may give you other medications to take before receiving this one. Be  sure to follow the directions from your care team. This medication may affect your coordination, reaction time, or judgement. Do not drive or operate machinery until you know how this medication affects you. Sit up or stand slowly to reduce the risk of dizzy or fainting spells. Drinking alcohol with this medication can increase the risk of these side effects. This medication may increase your risk of getting an infection. Call your care team for advice if you get a fever, chills, sore throat, or other symptoms of a cold or flu. Do not treat yourself. Try to avoid being around people who are sick. Avoid taking medications that contain aspirin, acetaminophen, ibuprofen, naproxen, or ketoprofen unless instructed by your care team. These medications may hide a fever. This medication may increase your risk to bruise or bleed. Call your care team if you notice any unusual bleeding. Be careful brushing or flossing your teeth or using a toothpick because you may get an infection or bleed more easily. If you have any dental work done, tell your dentist you are receiving this medication. Talk to your care team if you or your partner are pregnant or think either of you might be pregnant. This medication can cause serious birth defects if taken during pregnancy and for 6 months after the last dose. You will need a negative pregnancy test before starting this medication. Contraception is recommended while taking this medication and for 6 months after the last dose. Your care team can help you find the option that works for you. Do not father a child while taking this medication and for 3 months after the last dose. Use a condom for contraception during this time period. Do not breastfeed while taking this medication and for 7 days after the last dose. This medication may cause infertility. Talk to your care team if you are concerned about your fertility. What side effects may I notice from receiving this  medication? Side effects that you should report to your care team as soon as possible: Allergic reactions--skin rash, itching, hives, swelling of the face, lips, tongue, or throat Dry cough, shortness of breath or trouble breathing Increased saliva or tears, increased sweating, stomach cramping, diarrhea, small pupils, unusual weakness or fatigue, slow heartbeat Infection--fever, chills, cough, sore throat, wounds that don't heal, pain or trouble when passing urine, general feeling of discomfort or being unwell Kidney injury--decrease in the amount of urine, swelling of the ankles, hands, or feet Low red blood cell level--unusual weakness or fatigue, dizziness, headache, trouble breathing Severe or prolonged diarrhea Unusual bruising or bleeding Side effects that usually do not require medical attention (report to your care team if they continue or are bothersome): Constipation Diarrhea Hair loss Loss of appetite Nausea Stomach pain This list may not describe all possible side effects. Call your doctor for medical advice about side effects. You may  report side effects to FDA at 1-800-FDA-1088. Where should I keep my medication? This medication is given in a hospital or clinic. It will not be stored at home. NOTE: This sheet is a summary. It may not cover all possible information. If you have questions about this medicine, talk to your doctor, pharmacist, or health care provider.  2024 Elsevier/Gold Standard (2021-07-29 00:00:00)  Leucovorin Injection What is this medication? LEUCOVORIN (loo koe VOR in) prevents side effects from certain medications, such as methotrexate. It works by increasing folate levels. This helps protect healthy cells in your body. It may also be used to treat anemia caused by low levels of folate. It can also be used with fluorouracil, a type of chemotherapy, to treat colorectal cancer. It works by increasing the effects of fluorouracil in the body. This medicine  may be used for other purposes; ask your health care provider or pharmacist if you have questions. What should I tell my care team before I take this medication? They need to know if you have any of these conditions: Anemia from low levels of vitamin B12 in the blood An unusual or allergic reaction to leucovorin, folic acid, other medications, foods, dyes, or preservatives Pregnant or trying to get pregnant Breastfeeding How should I use this medication? This medication is injected into a vein or a muscle. It is given by your care team in a hospital or clinic setting. Talk to your care team about the use of this medication in children. Special care may be needed. Overdosage: If you think you have taken too much of this medicine contact a poison control center or emergency room at once. NOTE: This medicine is only for you. Do not share this medicine with others. What if I miss a dose? Keep appointments for follow-up doses. It is important not to miss your dose. Call your care team if you are unable to keep an appointment. What may interact with this medication? Capecitabine Fluorouracil Phenobarbital Phenytoin Primidone Trimethoprim;sulfamethoxazole This list may not describe all possible interactions. Give your health care provider a list of all the medicines, herbs, non-prescription drugs, or dietary supplements you use. Also tell them if you smoke, drink alcohol, or use illegal drugs. Some items may interact with your medicine. What should I watch for while using this medication? Your condition will be monitored carefully while you are receiving this medication. This medication may increase the side effects of 5-fluorouracil. Tell your care team if you have diarrhea or mouth sores that do not get better or that get worse. What side effects may I notice from receiving this medication? Side effects that you should report to your care team as soon as possible: Allergic reactions--skin rash,  itching, hives, swelling of the face, lips, tongue, or throat This list may not describe all possible side effects. Call your doctor for medical advice about side effects. You may report side effects to FDA at 1-800-FDA-1088. Where should I keep my medication? This medication is given in a hospital or clinic. It will not be stored at home. NOTE: This sheet is a summary. It may not cover all possible information. If you have questions about this medicine, talk to your doctor, pharmacist, or health care provider.  2024 Elsevier/Gold Standard (2021-08-20 00:00:00)  Fluorouracil Injection What is this medication? FLUOROURACIL (flure oh YOOR a sil) treats some types of cancer. It works by slowing down the growth of cancer cells. This medicine may be used for other purposes; ask your health care provider or  pharmacist if you have questions. COMMON BRAND NAME(S): Adrucil What should I tell my care team before I take this medication? They need to know if you have any of these conditions: Blood disorders Dihydropyrimidine dehydrogenase (DPD) deficiency Infection, such as chickenpox, cold sores, herpes Kidney disease Liver disease Poor nutrition Recent or ongoing radiation therapy An unusual or allergic reaction to fluorouracil, other medications, foods, dyes, or preservatives If you or your partner are pregnant or trying to get pregnant Breast-feeding How should I use this medication? This medication is injected into a vein. It is administered by your care team in a hospital or clinic setting. Talk to your care team about the use of this medication in children. Special care may be needed. Overdosage: If you think you have taken too much of this medicine contact a poison control center or emergency room at once. NOTE: This medicine is only for you. Do not share this medicine with others. What if I miss a dose? Keep appointments for follow-up doses. It is important not to miss your dose. Call  your care team if you are unable to keep an appointment. What may interact with this medication? Do not take this medication with any of the following: Live virus vaccines This medication may also interact with the following: Medications that treat or prevent blood clots, such as warfarin, enoxaparin, dalteparin This list may not describe all possible interactions. Give your health care provider a list of all the medicines, herbs, non-prescription drugs, or dietary supplements you use. Also tell them if you smoke, drink alcohol, or use illegal drugs. Some items may interact with your medicine. What should I watch for while using this medication? Your condition will be monitored carefully while you are receiving this medication. This medication may make you feel generally unwell. This is not uncommon as chemotherapy can affect healthy cells as well as cancer cells. Report any side effects. Continue your course of treatment even though you feel ill unless your care team tells you to stop. In some cases, you may be given additional medications to help with side effects. Follow all directions for their use. This medication may increase your risk of getting an infection. Call your care team for advice if you get a fever, chills, sore throat, or other symptoms of a cold or flu. Do not treat yourself. Try to avoid being around people who are sick. This medication may increase your risk to bruise or bleed. Call your care team if you notice any unusual bleeding. Be careful brushing or flossing your teeth or using a toothpick because you may get an infection or bleed more easily. If you have any dental work done, tell your dentist you are receiving this medication. Avoid taking medications that contain aspirin, acetaminophen, ibuprofen, naproxen, or ketoprofen unless instructed by your care team. These medications may hide a fever. Do not treat diarrhea with over the counter products. Contact your care team if  you have diarrhea that lasts more than 2 days or if it is severe and watery. This medication can make you more sensitive to the sun. Keep out of the sun. If you cannot avoid being in the sun, wear protective clothing and sunscreen. Do not use sun lamps, tanning beds, or tanning booths. Talk to your care team if you or your partner wish to become pregnant or think you might be pregnant. This medication can cause serious birth defects if taken during pregnancy and for 3 months after the last dose. A reliable form  of contraception is recommended while taking this medication and for 3 months after the last dose. Talk to your care team about effective forms of contraception. Do not father a child while taking this medication and for 3 months after the last dose. Use a condom while having sex during this time period. Do not breastfeed while taking this medication. This medication may cause infertility. Talk to your care team if you are concerned about your fertility. What side effects may I notice from receiving this medication? Side effects that you should report to your care team as soon as possible: Allergic reactions--skin rash, itching, hives, swelling of the face, lips, tongue, or throat Heart attack--pain or tightness in the chest, shoulders, arms, or jaw, nausea, shortness of breath, cold or clammy skin, feeling faint or lightheaded Heart failure--shortness of breath, swelling of the ankles, feet, or hands, sudden weight gain, unusual weakness or fatigue Heart rhythm changes--fast or irregular heartbeat, dizziness, feeling faint or lightheaded, chest pain, trouble breathing High ammonia level--unusual weakness or fatigue, confusion, loss of appetite, nausea, vomiting, seizures Infection--fever, chills, cough, sore throat, wounds that don't heal, pain or trouble when passing urine, general feeling of discomfort or being unwell Low red blood cell level--unusual weakness or fatigue, dizziness, headache,  trouble breathing Pain, tingling, or numbness in the hands or feet, muscle weakness, change in vision, confusion or trouble speaking, loss of balance or coordination, trouble walking, seizures Redness, swelling, and blistering of the skin over hands and feet Severe or prolonged diarrhea Unusual bruising or bleeding Side effects that usually do not require medical attention (report to your care team if they continue or are bothersome): Dry skin Headache Increased tears Nausea Pain, redness, or swelling with sores inside the mouth or throat Sensitivity to light Vomiting This list may not describe all possible side effects. Call your doctor for medical advice about side effects. You may report side effects to FDA at 1-800-FDA-1088. Where should I keep my medication? This medication is given in a hospital or clinic. It will not be stored at home. NOTE: This sheet is a summary. It may not cover all possible information. If you have questions about this medicine, talk to your doctor, pharmacist, or health care provider.  2024 Elsevier/Gold Standard (2021-07-23 00:00:00)  The chemotherapy medication bag should finish at 46 hours, 96 hours, or 7 days. For example, if your pump is scheduled for 46 hours and it was put on at 4:00 p.m., it should finish at 2:00 p.m. the day it is scheduled to come off regardless of your appointment time.     Estimated time to finish at 12:15 p.m. on Friday 04/24/2023.   If the display on your pump reads "Low Volume" and it is beeping, take the batteries out of the pump and come to the cancer center for it to be taken off.   If the pump alarms go off prior to the pump reading "Low Volume" then call [phone] and someone can assist you.  If the plunger comes out and the chemotherapy medication is leaking out, please use your home chemo spill kit to clean up the spill. Do NOT use paper towels or other household products.  If you have problems or questions  regarding your pump, please call either [phone] (24 hours a day) or the cancer center Monday-Friday 8:00 a.m.- 4:30 p.m. at the clinic number and we will assist you. If you are unable to get assistance, then go to the nearest Emergency Department and ask the staff  to contact the IV team for assistance.

## 2023-04-22 NOTE — Progress Notes (Signed)
[facility] OFFICE PROGRESS NOTE   Diagnosis: Colon cancer  INTERVAL HISTORY:   [name] returns as scheduled.  He completed a cycle of FOLFIRI 04/07/2023.  Avastin was held due to a recent surgical procedure.  Back pain improved a few days following chemotherapy and has now recurred.  The pain is not relieved with tramadol or hydrocodone.  He had less nausea than with previous cycles.  No mouth sores.  No diarrhea.  No hand or foot pain or redness.  Appetite described as "okay".  Objective:  Vital signs in last 24 hours:  Blood pressure 139/86, pulse 98, temperature 98.1 F (36.7 C), temperature source Tympanic, resp. rate 18, height 6' (1.829 m), weight 197 lb 8 oz (89.6 kg), SpO2 100%.    HEENT: No thrush or ulcers. Resp: Lungs clear bilaterally. Cardio: Regular rate and rhythm. GI: No hepatosplenomegaly. Vascular: No leg edema. Musculoskeletal: Nontender mid to low back. Skin: Healed surgical incision left suprapubic region. Port-A-Cath without erythema.  Lab Results:  Lab Results  Component Value Date   WBC 15.4 (H) 04/22/2023   HGB 11.0 (L) 04/22/2023   HCT 33.5 (L) 04/22/2023   MCV 100.0 04/22/2023   PLT 94 (L) 04/22/2023   NEUTROABS PENDING 04/22/2023    Imaging:  No results found.  Medications: I have reviewed the patient's current medications.  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, status post a left colectomy 03/04/2019 Mass felt to be in the transverse colon on a colonoscopy 01/07/2019 with a biopsy confirming poorly differentiated adenocarcinoma with signet ring cell features, mismatch repair protein expression normal CT abdomen/pelvis 01/08/2019 12.3 cm indeterminate splenic mass, present in 2015 on a lumbar MRI-felt to be benign, small subpleural and pleural-based nodules at the lung bases, status post left nephrectomy CT  chest 01/20/2019-small bilateral pulmonary nodules with rounded parenchymal nodules in the right lower lobe, indeterminate splenic mass CT chest 03/18/2019-multiple subcentimeter pulmonary nodules again seen bilaterally, greatest in the lower lobes, without significant change.  No new or enlarging pulmonary nodules or masses.  Partially visualized large heterogeneous splenic mass with no significant change.  Plan for follow-up chest CT at a 94-month interval. Cycle 1 FOLFOX 03/21/2019 Cycle 2 FOLFOX 04/04/2019 Cycle 3 FOLFOX 04/18/2019 (oxaliplatin dose reduced secondary to thrombocytopenia) Cycle 4 FOLFOX 05/02/2019 (oxaliplatin held secondary to thrombocytopenia) Cycle 5 FOLFOX 05/16/2019 Cycle 6 FOLFOX 05/30/2019 (oxaliplatin held secondary to thrombocytopenia) CT chest 06/08/2019-stable scattered small solid pulmonary nodules  Cycle 7 FOLFOX 06/13/2019 Cycle 8 FOLFOX 06/27/2019 Cycle 9 FOLFOX 07/11/2019 Cycle 10 FOLFOX 07/25/2019 (oxaliplatin held due to thrombocytopenia) Cycle 11 FOLFOX 08/08/2019 Cycle 12 FOLFOX 08/22/2019 CTs 01/09/2020-no evidence of recurrent disease, stable lung nodules favored to represent subpleural lymph nodes-dedicated follow-up not recommended CTs 01/11/2021-no evidence of recurrent disease CTs 01/24/2022-no evidence of recurrent disease CT lumbar spine, abdomen/pelvis 04/11/2022-numerous mixed lytic and sclerotic lesions scattered throughout the visualized axial and appendicular skeleton including several areas in the lumbar spine.   04/21/2022 CT biopsy sclerotic lesion right anterior iliac bone-consistent with metastatic colorectal adenocarcinoma, moderate to poorly differentiated.  Foundation 1-microsatellite stable, tumor mutation burden 4, K-ras G12S PET scan 05/09/2022-widespread hypermetabolic mixed faintly lytic and sclerotic osseous metastases throughout the axial and proximal appendicular skeleton; associated mild pathologic compression fracture at L3; no hypermetabolic  extraosseous metastatic disease. Cycle 1 FOLFIRI/bevacizumab 05/26/2022 Cycle 2 FOLFIRI/bevacizumab 06/09/2022 Cycle 3 FOLFIRI 06/24/2022, bevacizumab 06/26/2022 Cycle 4 FOLFIRI 07/08/2022, bevacizumab held due to proteinuria Cycle  5 FOLFIRI 07/22/2022, bevacizumab held due to proteinuria CTs 07/31/2022-potentially areas of sclerosis represent interval healing; new lytic lesion at T12, multiple new areas of punched-out sclerosis throughout the visualized osseous structures.  Index lesion in the left iliac wing measures 1.3 x 2.8 cm.  Healing of previously metabolically occult metastatic disease on 05/09/2022 is a possibility. Cycle 6 FOLFIRI 08/04/2022, irinotecan dose reduced due to thrombocytopenia, bevacizumab held pending 24-hour urine result Cycle 7 FOLFIRI 08/18/2022, bevacizumab held secondary to proteinuria, G-CSF held Cycle 8 FOLFIRI 09/01/2022, bevacizumab held, G-CSF Cycle 9 FOLFIRI 09/16/2022, bevacizumab held, G-CSF CTs 09/29/2022-enlargement of a hypodense lesion posterior left lobe of the liver, similar diffuse mixed lytic and sclerotic osseous metastatic disease, multiple new small nodules medial right lung base, multiple other small bilateral pulmonary nodules unchanged. Cycle 10 FOLFIRI plus Avastin 10/08/2022 Cycle 11 FOLFIRI 10/27/2022, Avastin held Cycle 12 FOLFIRI/Avastin 11/11/2022 Cycle 13 FOLFIRI/Avastin 11/25/2022 CTs 12/08/2022-stable lesion posterior left lobe of the liver.  Unchanged diffuse lytic and sclerotic osseous metastatic disease.  Stable small solid pulmonary nodules.  Stable splenic lesions.  Slightly increased bladder wall thickening. Cycle 14 FOLFIRI/Avastin 12/10/2022 Cycle 15 FOLFIRI/Avastin 12/24/2022 12/31/2022 24-hour urine protein 710 mg Cycle 16 FOLFIRI/Avastin 01/07/2023 Cycle 17 FOLFIRI 01/21/2023, Avastin held secondary to persistent proteinuria, slight increase in creatinine, and drainage from the suprapubic lesion CTs 01/26/2023-unchanged widespread sclerotic osseous  metastatic disease, unchanged hypodense liver lesions, occasional small bilateral lung nodules unchanged. Cycle 18 FOLFIRI 02/03/2023, Avastin held Cycle 19 FOLFIRI 02/17/2023, Avastin held Cycle 20 FOLFIRI 03/03/2023, Avastin held Cycle 21 FOLFIRI 03/16/2023, Avastin held CTs 04/04/2023: Right increased sclerosis of osseous metastatic disease with new sclerotic lesions of the sternum/manubrium and increase sclerotic metastases throughout the pelvis, unchanged hypodense liver lesions Cycle 22 FOLFIRI 04/07/2023, Avastin held Cycle 23 FOLFIRI/Avastin 04/22/2023   Multiple colon polyps-ascending colon polyps on the colonoscopy 01/07/2019-not removed Colonoscopy 01/03/2020-multiple polyps removed, tubular adenomas, inflammatory and hyperplastic polyps Wilms tumor at age 47, 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  Left nephrectomy at age 35 12.  Port-A-Cath placement, Dr. Michaell Cowing, 03/16/2019 13.  Thrombocytopenia secondary to chemotherapy-oxaliplatin dose reduced beginning with cycle 3 FOLFOX, improved 14.  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  Right groin soft tissue infection May 2023-status postsurgical debridement 18.  Left leg DVT-common femoral, femoral, popliteal, posterior tibial, and peroneal veins 19.  02/04/2022-MRI cervical spine-1.7 cm T1 lesion-indeterminate; bone scan 03/14/2022-focal intense activity at approximate T1 level felt to correspond to the lesion on MRI, additional foci of activity involving the lower thoracic and lumbar spine, right iliac bone  and pubic symphysis.  CT lumbar spine, abdomen/pelvis 04/11/2022-numerous mixed lytic and sclerotic lesions scattered throughout the visualized axial and appendicular skeleton including several areas in the lumbar spine.  04/21/2022 CT biopsy sclerotic lesion right anterior iliac bone-consistent with metastatic colorectal adenocarcinoma, moderate to poorly differentiated 20.  Inflamed sebaceous cyst 08/18/2022-doxycycline Surgical resection of area of "hidradenitis " 03/19/2023    Disposition: [name] appears stable.  He completed another cycle of FOLFIRI 04/07/2023.  He is tolerating the chemotherapy well in terms of side effects.  Plan to proceed with another cycle of FOLFIRI, resume Avastin today as scheduled.  We reviewed the CBC and chemistry panel from today.  Labs adequate to proceed as above.  He has mild thrombocytopenia.  He understands to contact the office with bleeding.  He has increased back pain.  He understands the concern we have for disease progression despite recent CT findings.  He feels he is deriving some benefit from treatment and would like to proceed as above.  We discussed a referral to radiation oncology to see if he is a candidate for a course of palliative radiation to the area of back pain (on exam seems to be mid to lower thoracic).  He would like to go ahead with this.  We placed the referral today.  Prescription for oxycodone sent to his pharmacy.  He understands he should not drive while taking pain medication.  We discussed a referral for a second opinion.  He would like to proceed with this.  We will place a referral today.  He will return for follow-up and treatment in 2 weeks.  We are available to see him sooner if needed.  Patient seen with [name].  Lonna Cobb ANP/GNP-BC   04/22/2023  9:53 AM  This was a shared visit with Lonna Cobb.  [name] was interviewed and examined.  He has increased pain, most likely secondary to progressive metastatic disease  involving the bones.  We discussed treatment options with [name] and his wife.  We reviewed recent CT images.  He appears to have extensive bone metastases.  The worst  pain appears to be localized to the mid and lower thoracic spine.  Will make a referral to radiation oncology to consider palliative radiation.  We adjusted the narcotic pain regimen.  Systemic treatment options are limited.  He will not qualify for clinical trial at [facility] due to the prostate cancer diagnosis.  Discussed referral for another medical oncology opinion.  He would like to continue FOLFIRI.  Avastin will be presented.  The chemotherapy was dose adjusted weight loss.  I was present for greater than 50% of today's visit.  I performed Medical Decision Making.  Mancel Bale, MD

## 2023-04-22 NOTE — Progress Notes (Signed)
Per Misty Stanley, NP; okay to treat with Urine Protein of 100

## 2023-04-23 ENCOUNTER — Other Ambulatory Visit: Payer: Self-pay

## 2023-04-23 NOTE — Progress Notes (Signed)
Histology and Location of Primary Cancer: Colon Cancer metastasized  to bone  Sites of Visceral and Bony Metastatic Disease: Mid-Lower Thoracic Spine  Location(s) of Symptomatic Metastases: Mid-Lower Thoracic Spine  Past/Anticipated chemotherapy by medical oncology, if any:  04/23/2023 Lonna Cobb, NP Dr. Thornton Papas   Pain on a scale of 0-10 is:  4/10 lower back radiates to bilateral hips.   If Spine Met(s), symptoms, if any, include: Bowel/Bladder retention or incontinence (please describe): No Numbness or weakness in extremities (please describe): General weakness all over no numbness. Current Decadron regimen, if applicable: No  Ambulatory status? Walker? Wheelchair?: Independent  SAFETY ISSUES: Prior radiation? Yes, (early 35's Wilm's tumor) Pacemaker/ICD? No Possible current pregnancy? Male Is the patient on methotrexate? No  Current Complaints / other details:

## 2023-04-24 ENCOUNTER — Inpatient Hospital Stay: Payer: BC Managed Care – PPO

## 2023-04-24 VITALS — BP 148/90 | HR 83 | Temp 99.0°F | Resp 18

## 2023-04-24 DIAGNOSIS — C787 Secondary malignant neoplasm of liver and intrahepatic bile duct: Secondary | ICD-10-CM | POA: Diagnosis not present

## 2023-04-24 DIAGNOSIS — C186 Malignant neoplasm of descending colon: Secondary | ICD-10-CM

## 2023-04-24 DIAGNOSIS — Z5189 Encounter for other specified aftercare: Secondary | ICD-10-CM | POA: Diagnosis not present

## 2023-04-24 DIAGNOSIS — C7951 Secondary malignant neoplasm of bone: Secondary | ICD-10-CM | POA: Diagnosis not present

## 2023-04-24 DIAGNOSIS — Z5111 Encounter for antineoplastic chemotherapy: Secondary | ICD-10-CM | POA: Diagnosis not present

## 2023-04-24 DIAGNOSIS — C7801 Secondary malignant neoplasm of right lung: Secondary | ICD-10-CM | POA: Diagnosis not present

## 2023-04-24 MED ORDER — SODIUM CHLORIDE 0.9% FLUSH
10.0000 mL | INTRAVENOUS | Status: DC | PRN
Start: 2023-04-24 — End: 2023-04-24
  Administered 2023-04-24: 10 mL

## 2023-04-24 MED ORDER — PEGFILGRASTIM-CBQV 6 MG/0.6ML ~~LOC~~ SOSY
6.0000 mg | PREFILLED_SYRINGE | Freq: Once | SUBCUTANEOUS | Status: AC
Start: 2023-04-24 — End: 2023-04-24
  Administered 2023-04-24: 6 mg via SUBCUTANEOUS
  Filled 2023-04-24: qty 0.6

## 2023-04-24 MED ORDER — HEPARIN SOD (PORK) LOCK FLUSH 100 UNIT/ML IV SOLN
500.0000 [IU] | Freq: Once | INTRAVENOUS | Status: AC | PRN
  Administered 2023-04-24: 500 [IU]

## 2023-04-24 NOTE — Patient Instructions (Signed)

## 2023-04-26 DIAGNOSIS — C186 Malignant neoplasm of descending colon: Secondary | ICD-10-CM | POA: Diagnosis not present

## 2023-04-27 ENCOUNTER — Encounter: Payer: Self-pay | Admitting: Urology

## 2023-04-27 DIAGNOSIS — C7951 Secondary malignant neoplasm of bone: Secondary | ICD-10-CM | POA: Insufficient documentation

## 2023-04-27 NOTE — Progress Notes (Signed)
Radiation Oncology         (336) [phone] ________________________________  Initial outpatient Consultation - Conducted via MyChart due to current COVID-19 concerns for limiting patient exposure  Name: [name] MRN: [id number]  Date of Service: 04/28/2023 DOB: 1965/08/15  QI:[id number], Jeannett Senior, MD  Ladene Artist, MD   REFERRING PHYSICIAN: Ladene Artist, MD  DIAGNOSIS: There were no encounter diagnoses.  No diagnosis found.  HISTORY OF PRESENT ILLNESS: VYNCENT [name] is a 58 y.o. male seen at the request of [name] for a history of metastatic colon cancer. He was initially diagnosed by routine screening colonoscopy on 01/07/19 under Dr. Bosie Clos. He proceeded to left colon resection on 03/04/19 under Dr. Michaell Cowing. Pathology showed a stage [id number] invasive adenocarcinoma with extracellular mucin and signet ring cells, with invasion through serosa and positive radial margin. Additionally, 8/26 lymph nodes were positive. He was subsequently treated with FOLFOX under [name], starting 03/21/19, and he completed 12 cycles in 07/2019. His surveillance CT scans in 12/2019 through 12/2021 were negative for recurrent disease. However, he was found to have numerous new osseous lesions on CT lumbar spine and abdomen/pelvis in 03/2022. Biopsy obtained shortly thereafter of a right anterior iliac bone lesion confirmed metastatic colorectal adenocarcinoma. A restaging PET scan on 05/09/22 showed widespread osseous metastatic disease and no hypermetabolic extraosseous metastatic disease. He was started on FOLFIRI with bevacizumab on 05/26/22, which he continues on.  At recent visit with Lonna Cobb, NP in oncology, he reported increased back pain.***  PREVIOUS RADIATION THERAPY: Yes  ~1972 for Wilms tumor (in [place])  PAST MEDICAL HISTORY:  Past Medical History:  Diagnosis Date   Allergic rhinitis    Asthma    animal dander & seasonal asthma   Colon cancer (HCC) 01/26/2019   Diabetes mellitus  without complication (HCC)    dx 2010  type 2   Discitis of lumbosacral region    first started with this ..and rolled onto the endocarditis    stayed a month   Endocarditis of mitral valve    11/2013 from back strep infection   Family history of colon cancer    Family history of thyroid cancer    H/O scoliosis    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   History of hiatal hernia    Hyperlipidemia    Hypertension    Diagnostic exercise tolerance test assessment:04/30/2010 : comments normal -no evidence os ischemia by ST analysis   lipoma    Lipoma 01/26/2019   Mitral regurgitation    Echo 12/2018: EF 60-65, myxomatous mitral valve, severe mitral regurgitation, partial flail leaflet of posterior mitral valve, myxomatous tricuspid valve with trivial TR, ascending aorta mildly dilated (39 mm)   Pneumonia    as child   PONV (postoperative nausea and vomiting)    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   Complex valvuloplasty including artificial Gore-tex neochord placement x6 with 28 mm Sorin Memo 4D ring annuloplasty via right mini thoracotomy approach   Solitary kidney    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      PAST SURGICAL HISTORY: Past Surgical History:  Procedure Laterality Date   ABDOMINAL AORTOGRAM N/A 02/10/2020   Procedure: ABDOMINAL AORTOGRAM;  Surgeon: Corky Crafts, MD;  Location: [facility] INVASIVE CV LAB;  Service: Cardiovascular;  Laterality: N/A;   APPLICATION OF WOUND VAC  08/15/2021   Procedure: APPLICATION OF WOUND VAC;  Surgeon: Emelia Loron, MD;  Location: [facility] OR;  Service: General;;  BIOPSY  01/07/2019   Procedure: BIOPSY;  Surgeon: Charlott Rakes, MD;  Location: [facility] ENDOSCOPY;  Service: Endoscopy;;   BIOPSY  01/03/2020   Procedure: BIOPSY;  Surgeon: Charlott Rakes, MD;  Location: [facility];  Service: Endoscopy;;   COLON SURGERY  03/04/2019   left colon segmental resection   COLONOSCOPY WITH PROPOFOL N/A  01/07/2019   Procedure: COLONOSCOPY WITH PROPOFOL;  Surgeon: Charlott Rakes, MD;  Location: [facility];  Service: Endoscopy;  Laterality: N/A;   COLONOSCOPY WITH PROPOFOL N/A 01/03/2020   Procedure: COLONOSCOPY WITH PROPOFOL;  Surgeon: Charlott Rakes, MD;  Location: [facility];  Service: Endoscopy;  Laterality: N/A;   INCISION AND DRAINAGE OF WOUND Right 08/13/2021   Procedure: IRRIGATION AND DEBRIDEMENT WOUND RIGHT GROIN;  Surgeon: Axel Filler, MD;  Location: [facility] OR;  Service: General;  Laterality: Right;   INGUINAL HERNIA REPAIR Left 04/23/2016   Procedure: LAPAROSCOPIC  INGUINAL REPAIR;  Surgeon: Berna Bue, MD;  Location: [facility] OR;  Service: General;  Laterality: Left;   IR IMAGING GUIDED PORT INSERTION  05/08/2022   IRRIGATION AND DEBRIDEMENT ABSCESS Right 08/15/2021   Procedure: IRRIGATION AND DEBRIDEMENT OF GROIN;  Surgeon: Emelia Loron, MD;  Location: [facility] OR;  Service: General;  Laterality: Right;   IRRIGATION AND DEBRIDEMENT SEBACEOUS CYST N/A 03/19/2023   Procedure: DEBRIDEMENT OF CHRONIC INFECTION;  Surgeon: Romie Levee, MD;  Location: [facility];  Service: General;  Laterality: N/A;   KNEE SURGERY     arthroscopic left knee   LIPOMA EXCISION  2015   x20 [facility]   MITRAL VALVE REPAIR Right 05/09/2020   Procedure: MINIMALLY INVASIVE MITRAL VALVE REPAIR (MVR) USING LIVANOVA MEMO 4D MITRAL RING;  Surgeon: Purcell Nails, MD;  Location: [facility] OR;  Service: Open Heart Surgery;  Laterality: Right;   POLYPECTOMY  01/03/2020   Procedure: POLYPECTOMY;  Surgeon: Charlott Rakes, MD;  Location: [facility];  Service: Endoscopy;;   PORT-A-CATH REMOVAL     PORTACATH PLACEMENT N/A 03/16/2019   Procedure: INSERTION PORT-A-CATH;  Surgeon: Karie Soda, MD;  Location: [facility] ORS;  Service: General;  Laterality: N/A;   RIGHT/LEFT HEART CATH AND CORONARY ANGIOGRAPHY N/A 02/10/2020   Procedure: RIGHT/LEFT HEART CATH AND CORONARY ANGIOGRAPHY;  Surgeon: Corky Crafts, MD;  Location: [facility] INVASIVE CV LAB;  Service: Cardiovascular;  Laterality: N/A;   SUBMUCOSAL INJECTION  01/07/2019   Procedure: SUBMUCOSAL INJECTION;  Surgeon: Charlott Rakes, MD;  Location: [facility];  Service: Endoscopy;;   TEE WITHOUT CARDIOVERSION N/A 02/10/2020   Procedure: TRANSESOPHAGEAL ECHOCARDIOGRAM (TEE);  Surgeon: Elease Hashimoto Deloris Ping, MD;  Location: [facility] ENDOSCOPY;  Service: Cardiovascular;  Laterality: N/A;  CATH AFTER TEE   TEE WITHOUT CARDIOVERSION N/A 05/09/2020   Procedure: TRANSESOPHAGEAL ECHOCARDIOGRAM (TEE);  Surgeon: Purcell Nails, MD;  Location: [facility] OR;  Service: Open Heart Surgery;  Laterality: N/A;   THYROIDECTOMY, PARTIAL  01/05/2009   Right Thyroid Lobectomy   TONSILLECTOMY     TOTAL NEPHRECTOMY Left 1970   Wilms Tumor left kidney excised as an infant    FAMILY HISTORY:  Family History  Problem Relation Age of Onset   Heart disease Father    Hernia Father    Healthy Sister    Stroke Paternal Grandmother    Cancer Mother        THYROID   Hypotension Mother    Heart attack Maternal Grandfather    Congestive Heart Failure Maternal Grandfather    Heart attack Paternal Grandfather    Colon cancer Maternal Aunt 56  Congestive Heart Failure Paternal Uncle    Congestive Heart Failure Maternal Grandmother    Colon cancer Other 58       MGMs brother   Colon cancer Other        MGFs brother    SOCIAL HISTORY:  Social History   Socioeconomic History   Marital status: Married    Spouse name: Not on file   Number of children: Not on file   Years of education: Not on file   Highest education level: Not on file  Occupational History   Occupation: executive  Tobacco Use   Smoking status: Never   Smokeless tobacco: Never  Vaping Use   Vaping status: Never Used  Substance and Sexual Activity   Alcohol use: Yes    Alcohol/week: 1.0 standard drink of alcohol    Types: 1 Glasses of wine per week    Comment: occasional   Drug use: No   Sexual  activity: Yes  Other Topics Concern   Not on file  Social History Narrative   Never smoked    Alcohol yes, rare ,1-2 per week   No recreational drugs   Occupation-  Paramedic and other executive courses   Marital status - married     Children 1 boy         Social Drivers of Corporate investment banker Strain: Not on file  Food Insecurity: Not on file  Transportation Needs: Not on file  Physical Activity: Not on file  Stress: Not on file  Social Connections: Not on file  Intimate Partner Violence: Not on file    ALLERGIES: Cat dander and Empagliflozin  MEDICATIONS:  Current Outpatient Medications  Medication Sig Dispense Refill   acetaminophen (TYLENOL) 500 MG tablet Take 1 tablet (500 mg total) by mouth daily as needed for mild pain. 30 tablet 0   albuterol (PROVENTIL HFA;VENTOLIN HFA) 108 (90 BASE) MCG/ACT inhaler Inhale 1-2 puffs into the lungs every 6 (six) hours as needed for wheezing or shortness of breath.     aspirin EC 81 MG EC tablet Take 1 tablet (81 mg total) by mouth daily. Swallow whole. 30 tablet 11   atorvastatin (LIPITOR) 10 MG tablet Take 1 tablet (10 mg total) by mouth daily. 30 tablet 1   calcium carbonate (TUMS EX) 750 MG chewable tablet Chew 1,500 mg by mouth daily as needed for heartburn.     cetirizine (ZYRTEC) 10 MG tablet Take 10 mg by mouth at bedtime. (Patient not taking: Reported on 04/22/2023)     dabigatran (PRADAXA) 150 MG CAPS capsule Take 150 mg by mouth 2 (two) times daily.     fluticasone (FLONASE) 50 MCG/ACT nasal spray Place 1 spray into both nostrils daily as needed for allergies or rhinitis. (Patient not taking: Reported on 04/07/2023)     HYDROcodone-acetaminophen (NORCO) 5-325 MG tablet Take 1-2 tablets by mouth every 6 (six) hours as needed for moderate pain (pain score 4-6). Do not drive while taking 60 tablet 0   ibuprofen (ADVIL) 200 MG tablet Take 200 mg by mouth daily as needed for mild pain.     insulin  glargine, 1 Unit Dial, (TOUJEO SOLOSTAR) 300 UNIT/ML Solostar Pen Inject 28 Units into the skin daily.     lidocaine-prilocaine (EMLA) cream Apply 1 Application topically as needed.     lisinopril (ZESTRIL) 10 MG tablet Take 1 tablet (10 mg total) by mouth daily. 90 tablet 3   loratadine (CLARITIN) 10 MG tablet  Take 10 mg by mouth daily as needed for allergies.     metFORMIN (GLUCOPHAGE) 1000 MG tablet Take 1 tablet (1,000 mg total) by mouth 2 (two) times daily.     methocarbamol (ROBAXIN) 500 MG tablet Take 1 tablet (500 mg total) by mouth 2 (two) times daily. (Patient not taking: Reported on 04/22/2023) 20 tablet 0   metoprolol succinate (TOPROL-XL) 25 MG 24 hr tablet Take 25 mg by mouth daily.     mometasone (ASMANEX) 220 MCG/INH inhaler Inhale 1 puff into the lungs daily.     Omega-3 Fatty Acids (FISH OIL PO) Take 1,000 mg by mouth 2 (two) times daily.     ONE TOUCH ULTRA TEST test strip 1 each by Other route as needed (GLUCOSE).      oxyCODONE (OXY IR/ROXICODONE) 5 MG immediate release tablet Take 1-2 tablets (5-10 mg total) by mouth every 6 (six) hours as needed for severe pain (pain score 7-10). For pain not relieved with hydrocodone 30 tablet 0   pioglitazone (ACTOS) 30 MG tablet Take 30 mg by mouth daily.     prochlorperazine (COMPAZINE) 10 MG tablet TAKE 1 TABLET BY MOUTH EVERY 6 HOURS AS NEEDED FOR NAUSEA OR VOMITING. 30 tablet 3   repaglinide (PRANDIN) 2 MG tablet Take 2 mg by mouth 2 (two) times daily.     traMADol (ULTRAM) 50 MG tablet Take 1 tablet (50 mg total) by mouth every 6 (six) hours as needed. 30 tablet 0   No current facility-administered medications for this encounter.    REVIEW OF SYSTEMS:  On review of systems, the patient reports that he is doing well overall. He denies any chest pain, shortness of breath, cough, fevers, chills, night sweats, unintended weight changes. He denies any bowel or bladder disturbances, and denies abdominal pain, nausea or vomiting. He denies  any new musculoskeletal or joint aches or pains.*** A complete review of systems is obtained and is otherwise negative.    PHYSICAL EXAM:  Wt Readings from Last 3 Encounters:  04/22/23 197 lb 8 oz (89.6 kg)  04/07/23 199 lb 8 oz (90.5 kg)  03/19/23 198 lb 4.8 oz (89.9 kg)   Temp Readings from Last 3 Encounters:  04/24/23 99 F (37.2 C) (Oral)  04/22/23 98.1 F (36.7 C) (Tympanic)  04/09/23 98.6 F (37 C) (Oral)   BP Readings from Last 3 Encounters:  04/24/23 (!) 148/90  04/22/23 (!) 153/90  04/22/23 139/86   Pulse Readings from Last 3 Encounters:  04/24/23 83  04/22/23 74  04/22/23 98    /10  In general this is a well appearing *** man in no acute distress. He's alert and oriented x4 and appropriate throughout the examination. Cardiopulmonary assessment is negative for acute distress and he exhibits normal effort.     KPS = ***  100 - Normal; no complaints; no evidence of disease. 90   - Able to carry on normal activity; minor signs or symptoms of disease. 80   - Normal activity with effort; some signs or symptoms of disease. 65   - Cares for self; unable to carry on normal activity or to do active work. 60   - Requires occasional assistance, but is able to care for most of his personal needs. 50   - Requires considerable assistance and frequent medical care. 40   - Disabled; requires special care and assistance. 30   - Severely disabled; hospital admission is indicated although death not imminent. 20   - Very sick; hospital  admission necessary; active supportive treatment necessary. 10   - Moribund; fatal processes progressing rapidly. 0     - Dead  Karnofsky DA, Abelmann WH, Craver LS and [facility] [phone]) The use of the nitrogen mustards in the palliative treatment of carcinoma: with particular reference to bronchogenic carcinoma Cancer 1 [id number]  LABORATORY DATA:  Lab Results  Component Value Date   WBC 15.4 (H) 04/22/2023   HGB 11.0 (L) 04/22/2023   HCT 33.5  (L) 04/22/2023   MCV 100.0 04/22/2023   PLT 94 (L) 04/22/2023   Lab Results  Component Value Date   NA 133 (L) 04/22/2023   K 4.5 04/22/2023   CL 100 04/22/2023   CO2 27 04/22/2023   Lab Results  Component Value Date   ALT 9 04/22/2023   AST 25 04/22/2023   ALKPHOS 1,177 (H) 04/22/2023   BILITOT 0.7 04/22/2023     RADIOGRAPHY: CT CHEST ABDOMEN PELVIS W CONTRAST Result Date: 04/06/2023 CLINICAL DATA:  Metastatic colon cancer restaging * Tracking Code: BO * EXAM: CT CHEST, ABDOMEN, AND PELVIS WITH CONTRAST TECHNIQUE: Multidetector CT imaging of the chest, abdomen and pelvis was performed following the standard protocol during bolus administration of intravenous contrast. RADIATION DOSE REDUCTION: This exam was performed according to the departmental dose-optimization program which includes automated exposure control, adjustment of the mA and/or kV according to patient size and/or use of iterative reconstruction technique. CONTRAST:  OMNIPAQUE IOHEXOL 300 MG/ML SOLN additional oral enteric contrast COMPARISON:  01/26/2023 FINDINGS: CT CHEST FINDINGS Cardiovascular: Right chest port catheter. Aortic atherosclerosis. Normal heart size. Mitral prosthesis. No pericardial effusion. Mediastinum/Nodes: No enlarged mediastinal, hilar, or axillary lymph nodes. Thyroid gland, trachea, and esophagus demonstrate no significant findings. Lungs/Pleura: Diffuse bilateral bronchial wall thickening. Unchanged segmental bronchiectasis in the peripheral right upper lobe (series 4, image 55). Multiple small bilateral pulmonary nodules in the dependent lung bases unchanged, largest again 0.5 cm in the right lower lobe (series 4, image 73). No pleural effusion or pneumothorax. Musculoskeletal: No chest wall abnormality. No acute osseous findings. CT ABDOMEN PELVIS FINDINGS Hepatobiliary: Atrophy of the left lobe of the liver. Unchanged hypodense lesion of the superior left lobe of the liver, hepatic segment IVA,  abutting the vena cava and diaphragm measuring 3.6 x 2.1 cm (series 2, image 49). Unchanged very subtle hypodense lesion of the central liver adjacent to the gallbladder fossa measuring 2.1 x 1.8 cm (series 2, image 57). Small gallstone. No gallbladder wall thickening or biliary ductal dilatation. Pancreas: Unremarkable. No pancreatic ductal dilatation or surrounding inflammatory changes. Spleen: Spleen is again enlarged by numerous lesions of varying attenuation, large, somewhat ill-defined lesion centrally unchanged at 12.0 x 9.5 cm (series 2, image 58) Adrenals/Urinary Tract: Adrenal glands are unremarkable. Status post left nephrectomy. Right kidney is normal, without renal calculi, solid lesion, or hydronephrosis. Unchanged mild bladder wall thickening. Stomach/Bowel: Stomach is within normal limits. Appendix appears normal. No evidence of bowel wall thickening, distention, or inflammatory changes. Status post sigmoid colon resection and reanastomosis. Vascular/Lymphatic: Aortic atherosclerosis. No enlarged abdominal or pelvic lymph nodes. Reproductive: Prostatomegaly. Other: No abdominal wall hernia or abnormality. No ascites. Musculoskeletal: No acute osseous findings. Slightly increased sclerosis of widespread osseous metastatic disease involving the included axial and proximal appendicular skeleton, for example with new sclerotic lesions of the sternum and manubrium (series 6, image 74) and increasingly confluent sclerotic metastases throughout the pelvis. IMPRESSION: 1. Slightly increased sclerosis of widespread osseous metastatic disease involving the included axial and proximal appendicular skeleton, for example  with new sclerotic lesions of the sternum and manubrium and increasingly confluent sclerotic metastases throughout the pelvis, findings most likely reflecting sclerotic treatment response rather than progression of disease. 2. Unchanged hypodense lesions liver lesions. Attention on follow-up. 3.  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 Attention on follow-up. 4. Unchanged small bilateral pulmonary nodules most likely infectious or inflammatory. Attention on follow-up. 5. Status post sigmoid colon resection and reanastomosis. 6. Status post left nephrectomy. 7. Cholelithiasis Aortic Atherosclerosis (ICD10-I70.0). Electronically Signed   By: Jearld Lesch M.D.   On: 04/06/2023 10:03      IMPRESSION/PLAN: This visit was conducted via MyChart to spare the patient unnecessary potential exposure in the healthcare setting during the current COVID-19 pandemic. 1. 58 y.o. man with painful osseous metastatic disease to mid back from metastatic colon cancer***  Today, we talked to the patient and family about the findings and workup thus far. We discussed the natural history of colon cancer and general treatment, highlighting the role of radiotherapy in the management of painful osseous metastasis. We discussed the available radiation techniques, and focused on the details and logistics of delivery. We reviewed the anticipated acute and late sequelae associated with radiation in this setting. The patient was encouraged to ask questions that were answered to his satisfaction.  At the end of our conversation, ***    Given current concerns for patient exposure during the COVID-19 pandemic, this encounter was conducted via {video-enabled WebEx visit.} {telephone. The patient was notified in advance and was offered a WebEX meeting to allow for face to face communication but unfortunately reported that he did not have the appropriate resources/technology to support such a visit and instead preferred to proceed with telephone consult.} The patient has given verbal consent for this type of encounter. The time spent during this encounter was ***  minutes. The attendants for this meeting include Margaretmary Dys MD, Ashlyn Bruning PA-C, patient [name] {and ***.} During the encounter, Margaretmary Dys MD and Marcello Fennel PA-C were located at [facility] Radiation Oncology Department.  Patient SUREN PAYNE {and *** were} was located at home.     Marguarite Arbour, PA-C    Margaretmary Dys, MD  [facility]  Radiation Oncology Direct Dial: [phone]  Fax: [phone] [email or web address]  Skype  [email or web address]   This document serves as a record of services personally performed by Margaretmary Dys, MD and Marcello Fennel, PA-C. It was created on their behalf by Mickie Bail, a trained medical scribe. The creation of this record is based on the scribe's personal observations and the provider's statements to them. This document has been checked and approved by the attending provider.

## 2023-04-28 ENCOUNTER — Ambulatory Visit
Admission: RE | Admit: 2023-04-28 | Discharge: 2023-04-28 | Disposition: A | Discharge status: Home / Self Care | Payer: BC Managed Care – PPO | Source: Ambulatory Visit | Attending: Radiation Oncology | Admitting: Radiation Oncology

## 2023-04-28 ENCOUNTER — Encounter: Payer: Self-pay | Admitting: Radiation Oncology

## 2023-04-28 ENCOUNTER — Other Ambulatory Visit: Payer: Self-pay

## 2023-04-28 DIAGNOSIS — C186 Malignant neoplasm of descending colon: Secondary | ICD-10-CM

## 2023-04-28 DIAGNOSIS — C7951 Secondary malignant neoplasm of bone: Secondary | ICD-10-CM | POA: Diagnosis not present

## 2023-04-28 DIAGNOSIS — C189 Malignant neoplasm of colon, unspecified: Secondary | ICD-10-CM

## 2023-04-29 ENCOUNTER — Other Ambulatory Visit: Payer: Self-pay

## 2023-04-29 ENCOUNTER — Ambulatory Visit
Admission: RE | Admit: 2023-04-29 | Discharge: 2023-04-29 | Disposition: A | Discharge status: Home / Self Care | Payer: BC Managed Care – PPO | Source: Ambulatory Visit | Attending: Radiation Oncology | Admitting: Radiation Oncology

## 2023-04-29 DIAGNOSIS — C7951 Secondary malignant neoplasm of bone: Secondary | ICD-10-CM | POA: Diagnosis not present

## 2023-04-29 DIAGNOSIS — C189 Malignant neoplasm of colon, unspecified: Secondary | ICD-10-CM | POA: Diagnosis not present

## 2023-04-29 DIAGNOSIS — C186 Malignant neoplasm of descending colon: Secondary | ICD-10-CM | POA: Diagnosis not present

## 2023-04-29 NOTE — Progress Notes (Signed)
  Radiation Oncology         (336) [phone] ________________________________  Name: [name] MRN: [id number]  Date: 04/29/2023  DOB: 1965-07-27  SIMULATION AND TREATMENT PLANNING NOTE    ICD-10-CM   1. 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   NARRATIVE:  The patient was brought to the CT Simulation planning suite.  Identity was confirmed.  All relevant records and images related to the planned course of therapy were reviewed.  The patient freely provided informed written consent to proceed with treatment after reviewing the details related to the planned course of therapy. The consent form was witnessed and verified by the simulation staff.  Then, the patient was set-up in a stable reproducible  supine position for radiation therapy.  CT images were obtained.  Surface markings were placed.  The CT images were loaded into the planning software.  Then the target and avoidance structures were contoured.  Treatment planning then occurred.  The radiation prescription was entered and confirmed.  Then, I designed and supervised the construction of multiple medically necessary complex treatment devices consisting of positioner and MLC apertures to cover the treated area.  I have requested : 3D Simulation  I have requested a DVH of the following structures: kidneys, bowel, spinal cord and target.  PLAN:  The patient will receive 30 Gy in 10 fractions.  ________________________________  Artist Pais Kathrynn Running, M.D.

## 2023-04-30 DIAGNOSIS — I10 Essential (primary) hypertension: Secondary | ICD-10-CM | POA: Diagnosis not present

## 2023-04-30 DIAGNOSIS — E7800 Pure hypercholesterolemia, unspecified: Secondary | ICD-10-CM | POA: Diagnosis not present

## 2023-04-30 DIAGNOSIS — E1169 Type 2 diabetes mellitus with other specified complication: Secondary | ICD-10-CM | POA: Diagnosis not present

## 2023-04-30 DIAGNOSIS — J45909 Unspecified asthma, uncomplicated: Secondary | ICD-10-CM | POA: Diagnosis not present

## 2023-05-01 DIAGNOSIS — C186 Malignant neoplasm of descending colon: Secondary | ICD-10-CM | POA: Diagnosis not present

## 2023-05-01 DIAGNOSIS — C7951 Secondary malignant neoplasm of bone: Secondary | ICD-10-CM | POA: Diagnosis not present

## 2023-05-01 DIAGNOSIS — C189 Malignant neoplasm of colon, unspecified: Secondary | ICD-10-CM | POA: Diagnosis not present

## 2023-05-03 ENCOUNTER — Other Ambulatory Visit: Payer: Self-pay | Admitting: Oncology

## 2023-05-03 DIAGNOSIS — C186 Malignant neoplasm of descending colon: Secondary | ICD-10-CM

## 2023-05-04 ENCOUNTER — Ambulatory Visit
Admission: RE | Admit: 2023-05-04 | Discharge: 2023-05-04 | Disposition: A | Discharge status: Home / Self Care | Payer: BC Managed Care – PPO | Source: Ambulatory Visit | Attending: Radiation Oncology | Admitting: Radiation Oncology

## 2023-05-04 ENCOUNTER — Other Ambulatory Visit: Payer: Self-pay

## 2023-05-04 DIAGNOSIS — C189 Malignant neoplasm of colon, unspecified: Secondary | ICD-10-CM | POA: Insufficient documentation

## 2023-05-04 DIAGNOSIS — C7951 Secondary malignant neoplasm of bone: Secondary | ICD-10-CM | POA: Diagnosis not present

## 2023-05-04 DIAGNOSIS — C186 Malignant neoplasm of descending colon: Secondary | ICD-10-CM | POA: Diagnosis not present

## 2023-05-04 LAB — RAD ONC ARIA SESSION SUMMARY
Course Elapsed Days: 0
Plan Fractions Treated to Date: 1
Plan Fractions Treated to Date: 1
Plan Prescribed Dose Per Fraction: 3 Gy
Plan Prescribed Dose Per Fraction: 3 Gy
Plan Total Fractions Prescribed: 10
Plan Total Fractions Prescribed: 10
Plan Total Prescribed Dose: 30 Gy
Plan Total Prescribed Dose: 30 Gy
Reference Point Dosage Given to Date: 3 Gy
Reference Point Dosage Given to Date: 3 Gy
Reference Point Session Dosage Given: 3 Gy
Reference Point Session Dosage Given: 3 Gy
Session Number: 1

## 2023-05-05 ENCOUNTER — Ambulatory Visit
Admission: RE | Admit: 2023-05-05 | Discharge: 2023-05-05 | Disposition: A | Discharge status: Home / Self Care | Payer: BC Managed Care – PPO | Source: Ambulatory Visit | Attending: Radiation Oncology

## 2023-05-05 ENCOUNTER — Other Ambulatory Visit: Payer: Self-pay

## 2023-05-05 DIAGNOSIS — C186 Malignant neoplasm of descending colon: Secondary | ICD-10-CM | POA: Diagnosis not present

## 2023-05-05 DIAGNOSIS — C7951 Secondary malignant neoplasm of bone: Secondary | ICD-10-CM | POA: Diagnosis not present

## 2023-05-05 DIAGNOSIS — Z510 Encounter for antineoplastic radiation therapy: Secondary | ICD-10-CM | POA: Diagnosis not present

## 2023-05-05 DIAGNOSIS — C189 Malignant neoplasm of colon, unspecified: Secondary | ICD-10-CM | POA: Diagnosis not present

## 2023-05-05 LAB — RAD ONC ARIA SESSION SUMMARY
Course Elapsed Days: 1
Plan Fractions Treated to Date: 2
Plan Fractions Treated to Date: 2
Plan Prescribed Dose Per Fraction: 3 Gy
Plan Prescribed Dose Per Fraction: 3 Gy
Plan Total Fractions Prescribed: 10
Plan Total Fractions Prescribed: 10
Plan Total Prescribed Dose: 30 Gy
Plan Total Prescribed Dose: 30 Gy
Reference Point Dosage Given to Date: 6 Gy
Reference Point Dosage Given to Date: 6 Gy
Reference Point Session Dosage Given: 3 Gy
Reference Point Session Dosage Given: 3 Gy
Session Number: 2

## 2023-05-06 ENCOUNTER — Ambulatory Visit
Admission: RE | Admit: 2023-05-06 | Discharge: 2023-05-06 | Disposition: A | Discharge status: Home / Self Care | Payer: BC Managed Care – PPO | Source: Ambulatory Visit | Attending: Radiation Oncology

## 2023-05-06 ENCOUNTER — Other Ambulatory Visit: Payer: Self-pay | Admitting: *Deleted

## 2023-05-06 ENCOUNTER — Inpatient Hospital Stay: Payer: BC Managed Care – PPO

## 2023-05-06 ENCOUNTER — Other Ambulatory Visit: Payer: Self-pay

## 2023-05-06 ENCOUNTER — Inpatient Hospital Stay: Payer: BC Managed Care – PPO | Admitting: Oncology

## 2023-05-06 VITALS — BP 130/82 | HR 96 | Temp 98.1°F | Resp 18 | Ht 72.0 in | Wt 194.3 lb

## 2023-05-06 DIAGNOSIS — Z5111 Encounter for antineoplastic chemotherapy: Secondary | ICD-10-CM | POA: Insufficient documentation

## 2023-05-06 DIAGNOSIS — C7801 Secondary malignant neoplasm of right lung: Secondary | ICD-10-CM | POA: Insufficient documentation

## 2023-05-06 DIAGNOSIS — C7951 Secondary malignant neoplasm of bone: Secondary | ICD-10-CM | POA: Insufficient documentation

## 2023-05-06 DIAGNOSIS — C61 Malignant neoplasm of prostate: Secondary | ICD-10-CM | POA: Insufficient documentation

## 2023-05-06 DIAGNOSIS — C186 Malignant neoplasm of descending colon: Secondary | ICD-10-CM

## 2023-05-06 DIAGNOSIS — Z5189 Encounter for other specified aftercare: Secondary | ICD-10-CM | POA: Insufficient documentation

## 2023-05-06 DIAGNOSIS — C189 Malignant neoplasm of colon, unspecified: Secondary | ICD-10-CM

## 2023-05-06 DIAGNOSIS — Z510 Encounter for antineoplastic radiation therapy: Secondary | ICD-10-CM | POA: Diagnosis not present

## 2023-05-06 DIAGNOSIS — Z5112 Encounter for antineoplastic immunotherapy: Secondary | ICD-10-CM | POA: Insufficient documentation

## 2023-05-06 DIAGNOSIS — C787 Secondary malignant neoplasm of liver and intrahepatic bile duct: Secondary | ICD-10-CM | POA: Insufficient documentation

## 2023-05-06 LAB — CBC WITH DIFFERENTIAL (CANCER CENTER ONLY)
Abs Immature Granulocytes: 0.6 10*3/uL — ABNORMAL HIGH (ref 0.00–0.07)
Band Neutrophils: 5 %
Basophils Absolute: 0 10*3/uL (ref 0.0–0.1)
Basophils Relative: 0 %
Eosinophils Absolute: 0.1 10*3/uL (ref 0.0–0.5)
Eosinophils Relative: 1 %
HCT: 32.8 % — ABNORMAL LOW (ref 39.0–52.0)
Hemoglobin: 10.7 g/dL — ABNORMAL LOW (ref 13.0–17.0)
Lymphocytes Relative: 9 %
Lymphs Abs: 1.3 10*3/uL (ref 0.7–4.0)
MCH: 32.6 pg (ref 26.0–34.0)
MCHC: 32.6 g/dL (ref 30.0–36.0)
MCV: 100 fL (ref 80.0–100.0)
Monocytes Absolute: 0.9 10*3/uL (ref 0.1–1.0)
Monocytes Relative: 6 %
Myelocytes: 4 %
Neutro Abs: 11.8 10*3/uL — ABNORMAL HIGH (ref 1.7–7.7)
Neutrophils Relative %: 75 %
Platelet Count: 160 10*3/uL (ref 150–400)
RBC: 3.28 MIL/uL — ABNORMAL LOW (ref 4.22–5.81)
RDW: 16.8 % — ABNORMAL HIGH (ref 11.5–15.5)
Smear Review: NORMAL
WBC Count: 14.8 10*3/uL — ABNORMAL HIGH (ref 4.0–10.5)
nRBC: 0.3 % — ABNORMAL HIGH (ref 0.0–0.2)

## 2023-05-06 LAB — CMP (CANCER CENTER ONLY)
ALT: 7 U/L (ref 0–44)
AST: 21 U/L (ref 15–41)
Albumin: 3.3 g/dL — ABNORMAL LOW (ref 3.5–5.0)
Alkaline Phosphatase: 887 U/L — ABNORMAL HIGH (ref 38–126)
Anion gap: 7 (ref 5–15)
BUN: 16 mg/dL (ref 6–20)
CO2: 25 mmol/L (ref 22–32)
Calcium: 8.5 mg/dL — ABNORMAL LOW (ref 8.9–10.3)
Chloride: 100 mmol/L (ref 98–111)
Creatinine: 0.69 mg/dL (ref 0.61–1.24)
GFR, Estimated: >60 mL/min (ref >60)
Glucose, Bld: 207 mg/dL — ABNORMAL HIGH (ref 70–99)
Potassium: 4.5 mmol/L (ref 3.5–5.1)
Sodium: 132 mmol/L — ABNORMAL LOW (ref 135–145)
Total Bilirubin: 0.5 mg/dL (ref 0.0–1.2)
Total Protein: 6.5 g/dL (ref 6.5–8.1)

## 2023-05-06 LAB — RAD ONC ARIA SESSION SUMMARY
Course Elapsed Days: 2
Plan Fractions Treated to Date: 3
Plan Fractions Treated to Date: 3
Plan Prescribed Dose Per Fraction: 3 Gy
Plan Prescribed Dose Per Fraction: 3 Gy
Plan Total Fractions Prescribed: 10
Plan Total Fractions Prescribed: 10
Plan Total Prescribed Dose: 30 Gy
Plan Total Prescribed Dose: 30 Gy
Reference Point Dosage Given to Date: 9 Gy
Reference Point Dosage Given to Date: 9 Gy
Reference Point Session Dosage Given: 3 Gy
Reference Point Session Dosage Given: 3 Gy
Session Number: 3

## 2023-05-06 LAB — TOTAL PROTEIN, URINE DIPSTICK: Protein, ur: 100 mg/dL — AB

## 2023-05-06 LAB — CEA (ACCESS): CEA (CHCC): 19.12 ng/mL — ABNORMAL HIGH (ref 0.00–5.00)

## 2023-05-06 MED ORDER — FLUOROURACIL CHEMO INJECTION 2.5 GM/50ML
400.0000 mg/m2 | Freq: Once | INTRAVENOUS | Status: AC
  Administered 2023-05-06: 850 mg via INTRAVENOUS
  Filled 2023-05-06: qty 17

## 2023-05-06 MED ORDER — DEXAMETHASONE SODIUM PHOSPHATE 10 MG/ML IJ SOLN
5.0000 mg | Freq: Once | INTRAMUSCULAR | Status: AC
  Administered 2023-05-06: 5 mg via INTRAVENOUS
  Filled 2023-05-06: qty 1

## 2023-05-06 MED ORDER — TRAMADOL HCL 50 MG PO TABS: 50.0000 mg | ORAL_TABLET | Freq: Four times a day (QID) | ORAL | 0 refills | Status: DC | PRN

## 2023-05-06 MED ORDER — SODIUM CHLORIDE 0.9 % IV SOLN
Freq: Once | INTRAVENOUS | Status: AC
Start: 2023-05-06 — End: 2023-05-06

## 2023-05-06 MED ORDER — PALONOSETRON HCL INJECTION 0.25 MG/5ML
0.2500 mg | Freq: Once | INTRAVENOUS | Status: AC
  Administered 2023-05-06: 0.25 mg via INTRAVENOUS
  Filled 2023-05-06: qty 5

## 2023-05-06 MED ORDER — ATROPINE SULFATE 1 MG/ML IV SOLN
0.5000 mg | Freq: Once | INTRAVENOUS | Status: AC | PRN
  Administered 2023-05-06: 0.5 mg via INTRAVENOUS
  Filled 2023-05-06: qty 1

## 2023-05-06 MED ORDER — SODIUM CHLORIDE 0.9 % IV SOLN
5.0000 mg/kg | Freq: Once | INTRAVENOUS | Status: AC
  Administered 2023-05-06: 450 mg via INTRAVENOUS
  Filled 2023-05-06: qty 16

## 2023-05-06 MED ORDER — SODIUM CHLORIDE 0.9 % IV SOLN
2400.0000 mg/m2 | INTRAVENOUS | Status: DC
  Administered 2023-05-06: 5000 mg via INTRAVENOUS
  Filled 2023-05-06: qty 100

## 2023-05-06 MED ORDER — DEXAMETHASONE 4 MG PO TABS: 4.0000 mg | ORAL_TABLET | Freq: Every day | ORAL | 0 refills | Status: DC

## 2023-05-06 MED ORDER — SODIUM CHLORIDE 0.9 % IV SOLN
400.0000 mg/m2 | Freq: Once | INTRAVENOUS | Status: AC
  Administered 2023-05-06: 852 mg via INTRAVENOUS
  Filled 2023-05-06: qty 42.6

## 2023-05-06 MED ORDER — HYDROCODONE-ACETAMINOPHEN 5-325 MG PO TABS: 1.0000 | ORAL_TABLET | Freq: Four times a day (QID) | ORAL | 0 refills | Status: DC | PRN

## 2023-05-06 MED ORDER — SODIUM CHLORIDE 0.9 % IV SOLN
140.0000 mg/m2 | Freq: Once | INTRAVENOUS | Status: AC
  Administered 2023-05-06: 300 mg via INTRAVENOUS
  Filled 2023-05-06: qty 15

## 2023-05-06 NOTE — Patient Instructions (Signed)
 [facility]   Discharge Instructions: Thank you for choosing [facility] to provide your oncology and hematology care.   If you have a lab appointment with the Cancer Center, please go directly to the Cancer Center and check in at the registration area.   Wear comfortable clothing and clothing appropriate for easy access to any Portacath or PICC line.   We strive to give you quality time with your provider. You may need to reschedule your appointment if you arrive late (15 or more minutes).  Arriving late affects you and other patients whose appointments are after yours.  Also, if you miss three or more appointments without notifying the office, you may be dismissed from the clinic at the provider's discretion.      For prescription refill requests, have your pharmacy contact our office and allow 72 hours for refills to be completed.    Today you received the following chemotherapy and/or immunotherapy agents Bevacizumab-awwb (MVASI), Irinotecan (CAMPTOSAR), Leucovorin & Flourouracil (ADRUCIL).      To help prevent nausea and vomiting after your treatment, we encourage you to take your nausea medication as directed.  BELOW ARE SYMPTOMS THAT SHOULD BE REPORTED IMMEDIATELY: *FEVER GREATER THAN 100.4 F (38 C) OR HIGHER *CHILLS OR SWEATING *NAUSEA AND VOMITING THAT IS NOT CONTROLLED WITH YOUR NAUSEA MEDICATION *UNUSUAL SHORTNESS OF BREATH *UNUSUAL BRUISING OR BLEEDING *URINARY PROBLEMS (pain or burning when urinating, or frequent urination) *BOWEL PROBLEMS (unusual diarrhea, constipation, pain near the anus) TENDERNESS IN MOUTH AND THROAT WITH OR WITHOUT PRESENCE OF ULCERS (sore throat, sores in mouth, or a toothache) UNUSUAL RASH, SWELLING OR PAIN  UNUSUAL VAGINAL DISCHARGE OR ITCHING   Items with * indicate a potential emergency and should be followed up as soon as possible or go to the Emergency Department if any  problems should occur.  Please show the CHEMOTHERAPY ALERT CARD or IMMUNOTHERAPY ALERT CARD at check-in to the Emergency Department and triage nurse.  Should you have questions after your visit or need to cancel or reschedule your appointment, please contact [facility]  Dept: [phone]  and follow the prompts.  Office hours are 8:00 a.m. to 4:30 p.m. Monday - Friday. Please note that voicemails left after 4:00 p.m. may not be returned until the following business day.  We are closed weekends and major holidays. You have access to a nurse at all times for urgent questions. Please call the main number to the clinic Dept: [phone] and follow the prompts.   For any non-urgent questions, you may also contact your provider using MyChart. We now offer e-Visits for anyone 75 and older to request care online for non-urgent symptoms. For details visit [email or web address].   Also download the MyChart app! Go to the app store, search "MyChart", open the app, select , and log in with your MyChart username and password.  Bevacizumab Injection What is this medication? BEVACIZUMAB (be va SIZ yoo mab) treats some types of cancer. It works by blocking a protein that causes cancer cells to grow and multiply. This helps to slow or stop the spread of cancer cells. It is a monoclonal antibody. This medicine may be used for other purposes; ask your health care provider or pharmacist if you have questions. COMMON BRAND NAME(S): Alymsys, Avastin, MVASI, Ros[place] Lao What should I tell my care team before I take this medication? They need  to know if you have any of these conditions: Blood clots Coughing up blood Having or recent surgery Heart failure High blood pressure History of a connection between 2 or more body parts that do not usually connect (fistula) History of a tear in your stomach or intestines Protein in your urine An  unusual or allergic reaction to bevacizumab, other medications, foods, dyes, or preservatives Pregnant or trying to get pregnant Breast-feeding How should I use this medication? This medication is injected into a vein. It is given by your care team in a hospital or clinic setting. Talk to your care team the use of this medication in children. Special care may be needed. Overdosage: If you think you have taken too much of this medicine contact a poison control center or emergency room at once. NOTE: This medicine is only for you. Do not share this medicine with others. What if I miss a dose? Keep appointments for follow-up doses. It is important not to miss your dose. Call your care team if you are unable to keep an appointment. What may interact with this medication? Interactions are not expected. This list may not describe all possible interactions. Give your health care provider a list of all the medicines, herbs, non-prescription drugs, or dietary supplements you use. Also tell them if you smoke, drink alcohol, or use illegal drugs. Some items may interact with your medicine. What should I watch for while using this medication? Your condition will be monitored carefully while you are receiving this medication. You may need blood work while taking this medication. This medication may make you feel generally unwell. This is not uncommon as chemotherapy can affect healthy cells as well as cancer cells. Report any side effects. Continue your course of treatment even though you feel ill unless your care team tells you to stop. This medication may increase your risk to bruise or bleed. Call your care team if you notice any unusual bleeding. Before having surgery, talk to your care team to make sure it is ok. This medication can increase the risk of poor healing of your surgical site or wound. You will need to stop this medication for 28 days before surgery. After surgery, wait at least 28 days before  restarting this medication. Make sure the surgical site or wound is healed enough before restarting this medication. Talk to your care team if questions. Talk to your care team if you may be pregnant. Serious birth defects can occur if you take this medication during pregnancy and for 6 months after the last dose. Contraception is recommended while taking this medication and for 6 months after the last dose. Your care team can help you find the option that works for you. Do not breastfeed while taking this medication and for 6 months after the last dose. This medication can cause infertility. Talk to your care team if you are concerned about your fertility. What side effects may I notice from receiving this medication? Side effects that you should report to your care team as soon as possible: Allergic reactions--skin rash, itching, hives, swelling of the face, lips, tongue, or throat Bleeding--bloody or black, tar-like stools, vomiting blood or brown material that looks like coffee grounds, red or dark brown urine, small red or purple spots on skin, unusual bruising or bleeding Blood clot--pain, swelling, or warmth in the leg, shortness of breath, chest pain Heart attack--pain or tightness in the chest, shoulders, arms, or jaw, nausea, shortness of breath, cold or clammy skin, feeling faint  or lightheaded Heart failure--shortness of breath, swelling of the ankles, feet, or hands, sudden weight gain, unusual weakness or fatigue Increase in blood pressure Infection--fever, chills, cough, sore throat, wounds that don't heal, pain or trouble when passing urine, general feeling of discomfort or being unwell Infusion reactions--chest pain, shortness of breath or trouble breathing, feeling faint or lightheaded Kidney injury--decrease in the amount of urine, swelling of the ankles, hands, or feet Stomach pain that is severe, does not go away, or gets worse Stroke--sudden numbness or weakness of the face,  arm, or leg, trouble speaking, confusion, trouble walking, loss of balance or coordination, dizziness, severe headache, change in vision Sudden and severe headache, confusion, change in vision, seizures, which may be signs of posterior reversible encephalopathy syndrome (PRES) Side effects that usually do not require medical attention (report to your care team if they continue or are bothersome): Back pain Change in taste Diarrhea Dry skin Increased tears Nosebleed This list may not describe all possible side effects. Call your doctor for medical advice about side effects. You may report side effects to FDA at 1-800-FDA-1088. Where should I keep my medication? This medication is given in a hospital or clinic. It will not be stored at home. NOTE: This sheet is a summary. It may not cover all possible information. If you have questions about this medicine, talk to your doctor, pharmacist, or health care provider.  2024 Elsevier/Gold Standard (2021-08-02 00:00:00)  Irinotecan Injection What is this medication? IRINOTECAN (ir in oh TEE kan) treats some types of cancer. It works by slowing down the growth of cancer cells. This medicine may be used for other purposes; ask your health care provider or pharmacist if you have questions. COMMON BRAND NAME(S): Camptosar What should I tell my care team before I take this medication? They need to know if you have any of these conditions: Dehydration Diarrhea Infection, especially a viral infection, such as chickenpox, cold sores, herpes Liver disease Low blood cell levels (white cells, red cells, and platelets) Low levels of electrolytes, such as calcium, magnesium, or potassium in your blood Recent or ongoing radiation An unusual or allergic reaction to irinotecan, other medications, foods, dyes, or preservatives If you or your partner are pregnant or trying to get pregnant Breast-feeding How should I use this medication? This medication is  injected into a vein. It is given by your care team in a hospital or clinic setting. Talk to your care team about the use of this medication in children. Special care may be needed. Overdosage: If you think you have taken too much of this medicine contact a poison control center or emergency room at once. NOTE: This medicine is only for you. Do not share this medicine with others. What if I miss a dose? Keep appointments for follow-up doses. It is important not to miss your dose. Call your care team if you are unable to keep an appointment. What may interact with this medication? Do not take this medication with any of the following: Cobicistat Itraconazole This medication may also interact with the following: Certain antibiotics, such as clarithromycin, rifampin, rifabutin Certain antivirals for HIV or AIDS Certain medications for fungal infections, such as ketoconazole, posaconazole, voriconazole Certain medications for seizures, such as carbamazepine, phenobarbital, phenytoin Gemfibrozil Nefazodone St. John's wort This list may not describe all possible interactions. Give your health care provider a list of all the medicines, herbs, non-prescription drugs, or dietary supplements you use. Also tell them if you smoke, drink alcohol, or use  illegal drugs. Some items may interact with your medicine. What should I watch for while using this medication? Your condition will be monitored carefully while you are receiving this medication. You may need blood work while taking this medication. This medication may make you feel generally unwell. This is not uncommon as chemotherapy can affect healthy cells as well as cancer cells. Report any side effects. Continue your course of treatment even though you feel ill unless your care team tells you to stop. This medication can cause serious side effects. To reduce the risk, your care team may give you other medications to take before receiving this one. Be  sure to follow the directions from your care team. This medication may affect your coordination, reaction time, or judgement. Do not drive or operate machinery until you know how this medication affects you. Sit up or stand slowly to reduce the risk of dizzy or fainting spells. Drinking alcohol with this medication can increase the risk of these side effects. This medication may increase your risk of getting an infection. Call your care team for advice if you get a fever, chills, sore throat, or other symptoms of a cold or flu. Do not treat yourself. Try to avoid being around people who are sick. Avoid taking medications that contain aspirin, acetaminophen, ibuprofen, naproxen, or ketoprofen unless instructed by your care team. These medications may hide a fever. This medication may increase your risk to bruise or bleed. Call your care team if you notice any unusual bleeding. Be careful brushing or flossing your teeth or using a toothpick because you may get an infection or bleed more easily. If you have any dental work done, tell your dentist you are receiving this medication. Talk to your care team if you or your partner are pregnant or think either of you might be pregnant. This medication can cause serious birth defects if taken during pregnancy and for 6 months after the last dose. You will need a negative pregnancy test before starting this medication. Contraception is recommended while taking this medication and for 6 months after the last dose. Your care team can help you find the option that works for you. Do not father a child while taking this medication and for 3 months after the last dose. Use a condom for contraception during this time period. Do not breastfeed while taking this medication and for 7 days after the last dose. This medication may cause infertility. Talk to your care team if you are concerned about your fertility. What side effects may I notice from receiving this  medication? Side effects that you should report to your care team as soon as possible: Allergic reactions--skin rash, itching, hives, swelling of the face, lips, tongue, or throat Dry cough, shortness of breath or trouble breathing Increased saliva or tears, increased sweating, stomach cramping, diarrhea, small pupils, unusual weakness or fatigue, slow heartbeat Infection--fever, chills, cough, sore throat, wounds that don't heal, pain or trouble when passing urine, general feeling of discomfort or being unwell Kidney injury--decrease in the amount of urine, swelling of the ankles, hands, or feet Low red blood cell level--unusual weakness or fatigue, dizziness, headache, trouble breathing Severe or prolonged diarrhea Unusual bruising or bleeding Side effects that usually do not require medical attention (report to your care team if they continue or are bothersome): Constipation Diarrhea Hair loss Loss of appetite Nausea Stomach pain This list may not describe all possible side effects. Call your doctor for medical advice about side effects. You may  report side effects to FDA at 1-800-FDA-1088. Where should I keep my medication? This medication is given in a hospital or clinic. It will not be stored at home. NOTE: This sheet is a summary. It may not cover all possible information. If you have questions about this medicine, talk to your doctor, pharmacist, or health care provider.  2024 Elsevier/Gold Standard (2021-07-29 00:00:00)  Leucovorin Injection What is this medication? LEUCOVORIN (loo koe VOR in) prevents side effects from certain medications, such as methotrexate. It works by increasing folate levels. This helps protect healthy cells in your body. It may also be used to treat anemia caused by low levels of folate. It can also be used with fluorouracil, a type of chemotherapy, to treat colorectal cancer. It works by increasing the effects of fluorouracil in the body. This medicine  may be used for other purposes; ask your health care provider or pharmacist if you have questions. What should I tell my care team before I take this medication? They need to know if you have any of these conditions: Anemia from low levels of vitamin B12 in the blood An unusual or allergic reaction to leucovorin, folic acid, other medications, foods, dyes, or preservatives Pregnant or trying to get pregnant Breastfeeding How should I use this medication? This medication is injected into a vein or a muscle. It is given by your care team in a hospital or clinic setting. Talk to your care team about the use of this medication in children. Special care may be needed. Overdosage: If you think you have taken too much of this medicine contact a poison control center or emergency room at once. NOTE: This medicine is only for you. Do not share this medicine with others. What if I miss a dose? Keep appointments for follow-up doses. It is important not to miss your dose. Call your care team if you are unable to keep an appointment. What may interact with this medication? Capecitabine Fluorouracil Phenobarbital Phenytoin Primidone Trimethoprim;sulfamethoxazole This list may not describe all possible interactions. Give your health care provider a list of all the medicines, herbs, non-prescription drugs, or dietary supplements you use. Also tell them if you smoke, drink alcohol, or use illegal drugs. Some items may interact with your medicine. What should I watch for while using this medication? Your condition will be monitored carefully while you are receiving this medication. This medication may increase the side effects of 5-fluorouracil. Tell your care team if you have diarrhea or mouth sores that do not get better or that get worse. What side effects may I notice from receiving this medication? Side effects that you should report to your care team as soon as possible: Allergic reactions--skin rash,  itching, hives, swelling of the face, lips, tongue, or throat This list may not describe all possible side effects. Call your doctor for medical advice about side effects. You may report side effects to FDA at 1-800-FDA-1088. Where should I keep my medication? This medication is given in a hospital or clinic. It will not be stored at home. NOTE: This sheet is a summary. It may not cover all possible information. If you have questions about this medicine, talk to your doctor, pharmacist, or health care provider.  2024 Elsevier/Gold Standard (2021-08-20 00:00:00)  Fluorouracil Injection What is this medication? FLUOROURACIL (flure oh YOOR a sil) treats some types of cancer. It works by slowing down the growth of cancer cells. This medicine may be used for other purposes; ask your health care provider or  pharmacist if you have questions. COMMON BRAND NAME(S): Adrucil What should I tell my care team before I take this medication? They need to know if you have any of these conditions: Blood disorders Dihydropyrimidine dehydrogenase (DPD) deficiency Infection, such as chickenpox, cold sores, herpes Kidney disease Liver disease Poor nutrition Recent or ongoing radiation therapy An unusual or allergic reaction to fluorouracil, other medications, foods, dyes, or preservatives If you or your partner are pregnant or trying to get pregnant Breast-feeding How should I use this medication? This medication is injected into a vein. It is administered by your care team in a hospital or clinic setting. Talk to your care team about the use of this medication in children. Special care may be needed. Overdosage: If you think you have taken too much of this medicine contact a poison control center or emergency room at once. NOTE: This medicine is only for you. Do not share this medicine with others. What if I miss a dose? Keep appointments for follow-up doses. It is important not to miss your dose. Call  your care team if you are unable to keep an appointment. What may interact with this medication? Do not take this medication with any of the following: Live virus vaccines This medication may also interact with the following: Medications that treat or prevent blood clots, such as warfarin, enoxaparin, dalteparin This list may not describe all possible interactions. Give your health care provider a list of all the medicines, herbs, non-prescription drugs, or dietary supplements you use. Also tell them if you smoke, drink alcohol, or use illegal drugs. Some items may interact with your medicine. What should I watch for while using this medication? Your condition will be monitored carefully while you are receiving this medication. This medication may make you feel generally unwell. This is not uncommon as chemotherapy can affect healthy cells as well as cancer cells. Report any side effects. Continue your course of treatment even though you feel ill unless your care team tells you to stop. In some cases, you may be given additional medications to help with side effects. Follow all directions for their use. This medication may increase your risk of getting an infection. Call your care team for advice if you get a fever, chills, sore throat, or other symptoms of a cold or flu. Do not treat yourself. Try to avoid being around people who are sick. This medication may increase your risk to bruise or bleed. Call your care team if you notice any unusual bleeding. Be careful brushing or flossing your teeth or using a toothpick because you may get an infection or bleed more easily. If you have any dental work done, tell your dentist you are receiving this medication. Avoid taking medications that contain aspirin, acetaminophen, ibuprofen, naproxen, or ketoprofen unless instructed by your care team. These medications may hide a fever. Do not treat diarrhea with over the counter products. Contact your care team if  you have diarrhea that lasts more than 2 days or if it is severe and watery. This medication can make you more sensitive to the sun. Keep out of the sun. If you cannot avoid being in the sun, wear protective clothing and sunscreen. Do not use sun lamps, tanning beds, or tanning booths. Talk to your care team if you or your partner wish to become pregnant or think you might be pregnant. This medication can cause serious birth defects if taken during pregnancy and for 3 months after the last dose. A reliable form  of contraception is recommended while taking this medication and for 3 months after the last dose. Talk to your care team about effective forms of contraception. Do not father a child while taking this medication and for 3 months after the last dose. Use a condom while having sex during this time period. Do not breastfeed while taking this medication. This medication may cause infertility. Talk to your care team if you are concerned about your fertility. What side effects may I notice from receiving this medication? Side effects that you should report to your care team as soon as possible: Allergic reactions--skin rash, itching, hives, swelling of the face, lips, tongue, or throat Heart attack--pain or tightness in the chest, shoulders, arms, or jaw, nausea, shortness of breath, cold or clammy skin, feeling faint or lightheaded Heart failure--shortness of breath, swelling of the ankles, feet, or hands, sudden weight gain, unusual weakness or fatigue Heart rhythm changes--fast or irregular heartbeat, dizziness, feeling faint or lightheaded, chest pain, trouble breathing High ammonia level--unusual weakness or fatigue, confusion, loss of appetite, nausea, vomiting, seizures Infection--fever, chills, cough, sore throat, wounds that don't heal, pain or trouble when passing urine, general feeling of discomfort or being unwell Low red blood cell level--unusual weakness or fatigue, dizziness, headache,  trouble breathing Pain, tingling, or numbness in the hands or feet, muscle weakness, change in vision, confusion or trouble speaking, loss of balance or coordination, trouble walking, seizures Redness, swelling, and blistering of the skin over hands and feet Severe or prolonged diarrhea Unusual bruising or bleeding Side effects that usually do not require medical attention (report to your care team if they continue or are bothersome): Dry skin Headache Increased tears Nausea Pain, redness, or swelling with sores inside the mouth or throat Sensitivity to light Vomiting This list may not describe all possible side effects. Call your doctor for medical advice about side effects. You may report side effects to FDA at 1-800-FDA-1088. Where should I keep my medication? This medication is given in a hospital or clinic. It will not be stored at home. NOTE: This sheet is a summary. It may not cover all possible information. If you have questions about this medicine, talk to your doctor, pharmacist, or health care provider.  2024 Elsevier/Gold Standard (2021-07-23 00:00:00)  The chemotherapy medication bag should finish at 46 hours, 96 hours, or 7 days. For example, if your pump is scheduled for 46 hours and it was put on at 4:00 p.m., it should finish at 2:00 p.m. the day it is scheduled to come off regardless of your appointment time.     Estimated time to finish at 12:15 p.m. on Friday 04/24/2023.   If the display on your pump reads "Low Volume" and it is beeping, take the batteries out of the pump and come to the cancer center for it to be taken off.   If the pump alarms go off prior to the pump reading "Low Volume" then call [phone] and someone can assist you.  If the plunger comes out and the chemotherapy medication is leaking out, please use your home chemo spill kit to clean up the spill. Do NOT use paper towels or other household products.  If you have problems or questions  regarding your pump, please call either [phone] (24 hours a day) or the cancer center Monday-Friday 8:00 a.m.- 4:30 p.m. at the clinic number and we will assist you. If you are unable to get assistance, then go to the nearest Emergency Department and ask the staff  to contact the IV team for assistance.

## 2023-05-06 NOTE — Patient Instructions (Signed)

## 2023-05-06 NOTE — Progress Notes (Signed)
 Patient seen by Dr. Arley Hof today  Vitals are within treatment parameters:Yes   Labs are within treatment parameters: Yes  OK to proceed with urine protein 100  Treatment plan has been signed: Yes   Per physician team, Patient is ready for treatment and there are NO modifications to the treatment plan.

## 2023-05-06 NOTE — Progress Notes (Signed)
 [facility] OFFICE PROGRESS NOTE   Diagnosis: Colon cancer  INTERVAL HISTORY:   [name] returns as scheduled.  He began palliative radiation to the spine and left shoulder 05/04/2023.  He continues to have pain at the lower back and hips.  He takes tramadol , hydrocodone , and oxycodone  as needed.  He varies the pain medication based on his level of pain.  No nausea/vomiting or diarrhea.  He is working.  He reports marked improvement in pain after receiving Decadron  on the day of chemotherapy.  Objective:  Vital signs in last 24 hours:  Blood pressure 130/82, pulse 96, temperature 98.1 F (36.7 C), temperature source Temporal, resp. rate 18, height 6' (1.829 m), weight 194 lb 4.8 oz (88.1 kg), SpO2 100%.    HEENT: No thrush or ulcers Resp: Lungs clear bilaterally Cardio: Regular rate and rhythm GI: No hepatosplenomegaly, nontender, healed surgical site in the left groin Vascular: No leg edema    Portacath/PICC-without erythema  Lab Results:  Lab Results  Component Value Date   WBC 14.8 (H) 05/06/2023   HGB 10.7 (L) 05/06/2023   HCT 32.8 (L) 05/06/2023   MCV 100.0 05/06/2023   PLT 160 05/06/2023   NEUTROABS 11.8 (H) 05/06/2023    CMP  Lab Results  Component Value Date   NA 132 (L) 05/06/2023   K 4.5 05/06/2023   CL 100 05/06/2023   CO2 25 05/06/2023   GLUCOSE 207 (H) 05/06/2023   BUN 16 05/06/2023   CREATININE 0.69 05/06/2023   CALCIUM  8.5 (L) 05/06/2023   PROT 6.5 05/06/2023   ALBUMIN  3.3 (L) 05/06/2023   AST 21 05/06/2023   ALT 7 05/06/2023   ALKPHOS 887 (H) 05/06/2023   BILITOT 0.5 05/06/2023   GFRNONAA >60 05/06/2023   GFRAA 115 02/08/2020    Lab Results  Component Value Date   CEA1 2.00 07/13/2020   CEA 19.12 (H) 05/06/2023     Medications: I have reviewed the patient's current medications.   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, status post a left colectomy 03/04/2019 Mass felt to be in the transverse colon on a colonoscopy 01/07/2019 with a biopsy confirming poorly differentiated adenocarcinoma with signet ring cell features, mismatch repair protein expression normal CT abdomen/pelvis 01/08/2019 12.3 cm indeterminate splenic mass, present in 2015 on a lumbar MRI-felt to be benign, small subpleural and pleural-based nodules at the lung bases, status post left nephrectomy CT chest 01/20/2019-small bilateral pulmonary nodules with rounded parenchymal nodules in the right lower lobe, indeterminate splenic mass CT chest 03/18/2019-multiple subcentimeter pulmonary nodules again seen bilaterally, greatest in the lower lobes, without significant change.  No new or enlarging pulmonary nodules or masses.  Partially visualized large heterogeneous splenic mass with no significant change.  Plan for follow-up chest CT at a 75-month interval. Cycle 1 FOLFOX 03/21/2019 Cycle 2 FOLFOX 04/04/2019 Cycle 3 FOLFOX 04/18/2019 (oxaliplatin  dose reduced secondary to thrombocytopenia) Cycle 4 FOLFOX 05/02/2019 (oxaliplatin  held secondary to thrombocytopenia) Cycle 5 FOLFOX 05/16/2019 Cycle 6 FOLFOX 05/30/2019 (oxaliplatin  held secondary to thrombocytopenia) CT chest 06/08/2019-stable scattered small solid pulmonary nodules  Cycle 7 FOLFOX 06/13/2019 Cycle 8 FOLFOX 06/27/2019 Cycle 9 FOLFOX 07/11/2019 Cycle 10 FOLFOX 07/25/2019 (oxaliplatin  held due to thrombocytopenia) Cycle 11 FOLFOX 08/08/2019 Cycle 12 FOLFOX 08/22/2019 CTs 01/09/2020-no evidence of recurrent disease, stable lung nodules favored to represent subpleural lymph nodes-dedicated follow-up not recommended CTs 01/11/2021-no evidence of recurrent disease CTs 01/24/2022-no evidence of recurrent disease CT  lumbar spine, abdomen/pelvis 04/11/2022-numerous mixed lytic and sclerotic lesions scattered throughout the visualized axial and appendicular skeleton including several areas in  the lumbar spine.   04/21/2022 CT biopsy sclerotic lesion right anterior iliac bone-consistent with metastatic colorectal adenocarcinoma, moderate to poorly differentiated.  Foundation 1-microsatellite stable, tumor mutation burden 4, K-ras G12S PET scan 05/09/2022-widespread hypermetabolic mixed faintly lytic and sclerotic osseous metastases throughout the axial and proximal appendicular skeleton; associated mild pathologic compression fracture at L3; no hypermetabolic extraosseous metastatic disease. Cycle 1 FOLFIRI/bevacizumab  05/26/2022 Cycle 2 FOLFIRI/bevacizumab  06/09/2022 Cycle 3 FOLFIRI 06/24/2022, bevacizumab  06/26/2022 Cycle 4 FOLFIRI 07/08/2022, bevacizumab  held due to proteinuria Cycle 5 FOLFIRI 07/22/2022, bevacizumab  held due to proteinuria CTs 07/31/2022-potentially areas of sclerosis represent interval healing; new lytic lesion at T12, multiple new areas of punched-out sclerosis throughout the visualized osseous structures.  Index lesion in the left iliac wing measures 1.3 x 2.8 cm.  Healing of previously metabolically occult metastatic disease on 05/09/2022 is a possibility. Cycle 6 FOLFIRI 08/04/2022, irinotecan  dose reduced due to thrombocytopenia, bevacizumab  held pending 24-hour urine result Cycle 7 FOLFIRI 08/18/2022, bevacizumab  held secondary to proteinuria, G-CSF held Cycle 8 FOLFIRI 09/01/2022, bevacizumab  held, G-CSF Cycle 9 FOLFIRI 09/16/2022, bevacizumab  held, G-CSF CTs 09/29/2022-enlargement of a hypodense lesion posterior left lobe of the liver, similar diffuse mixed lytic and sclerotic osseous metastatic disease, multiple new small nodules medial right lung base, multiple other small bilateral pulmonary nodules unchanged. Cycle 10 FOLFIRI plus Avastin  10/08/2022 Cycle 11 FOLFIRI 10/27/2022, Avastin  held Cycle 12 FOLFIRI/Avastin  11/11/2022 Cycle 13 FOLFIRI/Avastin  11/25/2022 CTs 12/08/2022-stable lesion posterior left lobe of the liver.  Unchanged diffuse lytic and sclerotic osseous metastatic  disease.  Stable small solid pulmonary nodules.  Stable splenic lesions.  Slightly increased bladder wall thickening. Cycle 14 FOLFIRI/Avastin  12/10/2022 Cycle 15 FOLFIRI/Avastin  12/24/2022 12/31/2022 24-hour urine protein 710 mg Cycle 16 FOLFIRI/Avastin  01/07/2023 Cycle 17 FOLFIRI 01/21/2023, Avastin  held secondary to persistent proteinuria, slight increase in creatinine, and drainage from the suprapubic lesion CTs 01/26/2023-unchanged widespread sclerotic osseous metastatic disease, unchanged hypodense liver lesions, occasional small bilateral lung nodules unchanged. Cycle 18 FOLFIRI 02/03/2023, Avastin  held Cycle 19 FOLFIRI 02/17/2023, Avastin  held Cycle 20 FOLFIRI 03/03/2023, Avastin  held Cycle 21 FOLFIRI 03/16/2023, Avastin  held CTs 04/04/2023: Right increased sclerosis of osseous metastatic disease with new sclerotic lesions of the sternum/manubrium and increase sclerotic metastases throughout the pelvis, unchanged hypodense liver lesions Cycle 22 FOLFIRI 04/07/2023, Avastin  held Cycle 23 FOLFIRI/Avastin  04/22/2023 Cycle 24 FOLFIRI/Avastin  05/06/2023   Multiple colon polyps-ascending colon polyps on the colonoscopy 01/07/2019-not removed Colonoscopy 01/03/2020-multiple polyps removed, tubular adenomas, inflammatory and hyperplastic polyps Wilms tumor at age 83, status post chemotherapy/radiation and a nephrectomy in [place]  Hurthle cell adenomas, 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  Left nephrectomy at age 85 12.  Port-A-Cath placement, Dr. Sheldon, 03/16/2019 13.  Thrombocytopenia secondary to chemotherapy-oxaliplatin  dose reduced beginning with cycle 3 FOLFOX, improved 14.  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  Prostate cancer  04/04/2021-Gleason 6 adenocarcinoma involving 10% of 1 core biopsy 17.  Right groin soft tissue infection May 2023-status postsurgical debridement 18.  Left leg DVT-common femoral, femoral, popliteal, posterior tibial, and peroneal veins 19.  02/04/2022-MRI cervical spine-1.7 cm T1 lesion-indeterminate; bone scan 03/14/2022-focal intense activity at approximate T1 level felt to correspond to the lesion on MRI, additional foci of activity involving  the lower thoracic and lumbar spine, right iliac bone and pubic symphysis.  CT lumbar spine, abdomen/pelvis 04/11/2022-numerous mixed lytic and sclerotic lesions scattered throughout the visualized axial and appendicular skeleton including several areas in the lumbar spine.  04/21/2022 CT biopsy sclerotic lesion right anterior iliac bone-consistent with metastatic colorectal adenocarcinoma, moderate to poorly differentiated 20.  Inflamed sebaceous cyst 08/18/2022-doxycycline  Surgical resection of area of hidradenitis  03/19/2023      Disposition: David. [name] appears stable.  He is completing a course of palliative radiation to the spine and left shoulder.  The CA 19-9 is slightly lower today.  He continues to tolerate the FOLFIRI/bevacizumab  well.  The plan is to proceed with FOLFIRI/bevacizumab .  He will complete the course of palliative radiation over the next week.  He will begin a trial of daily Decadron  for pain.  He will discontinue ibuprofen while on Decadron .  He understands Decadron  may result in elevation of the blood sugar.  He will call us  if the blood sugar is consistently over 300.  We will ask Dr. Nanci to provide a sliding insulin  scale as needed.  David. [name] would like another opinion for management of the colon cancer.  Dr. Donato confirms he will not be a clinical trial candidate due to the untreated prostate cancer.  We will make a referral to [facility].  He will return for an office visit and chemotherapy in 2 weeks.    David Hof,  MD  05/06/2023  11:10 AM

## 2023-05-07 ENCOUNTER — Other Ambulatory Visit: Payer: Self-pay

## 2023-05-07 ENCOUNTER — Encounter: Payer: Self-pay | Admitting: Radiation Oncology

## 2023-05-07 ENCOUNTER — Ambulatory Visit
Admission: RE | Admit: 2023-05-07 | Discharge: 2023-05-07 | Disposition: A | Discharge status: Home / Self Care | Payer: BC Managed Care – PPO | Source: Ambulatory Visit | Attending: Radiation Oncology | Admitting: Radiation Oncology

## 2023-05-07 DIAGNOSIS — C7951 Secondary malignant neoplasm of bone: Secondary | ICD-10-CM | POA: Diagnosis not present

## 2023-05-07 DIAGNOSIS — C189 Malignant neoplasm of colon, unspecified: Secondary | ICD-10-CM | POA: Diagnosis not present

## 2023-05-07 DIAGNOSIS — Z510 Encounter for antineoplastic radiation therapy: Secondary | ICD-10-CM | POA: Diagnosis not present

## 2023-05-07 DIAGNOSIS — C186 Malignant neoplasm of descending colon: Secondary | ICD-10-CM | POA: Diagnosis not present

## 2023-05-07 LAB — RAD ONC ARIA SESSION SUMMARY
Course Elapsed Days: 3
Plan Fractions Treated to Date: 4
Plan Fractions Treated to Date: 4
Plan Prescribed Dose Per Fraction: 3 Gy
Plan Prescribed Dose Per Fraction: 3 Gy
Plan Total Fractions Prescribed: 10
Plan Total Fractions Prescribed: 10
Plan Total Prescribed Dose: 30 Gy
Plan Total Prescribed Dose: 30 Gy
Reference Point Dosage Given to Date: 12 Gy
Reference Point Dosage Given to Date: 12 Gy
Reference Point Session Dosage Given: 3 Gy
Reference Point Session Dosage Given: 3 Gy
Session Number: 4

## 2023-05-07 NOTE — Progress Notes (Signed)
  Radiation Oncology         [phone]) [phone] ________________________________  Name: [name] MRN: [id number]  Date: 05/07/2023  DOB: 07-25-1965  Chart Note:  This patient is currently receiving palliative radiation to the spine and left shoulder to alleviate pain related to metastatic disease.  He does have a PET avid metastasis in the proximal left humerus which is currently receiving radiation.  He has received 4 fractions of treatment out of a planned course of 10 thus far.  In conversation today, the patient noted that he actually has more pain in his right shoulder than his left shoulder and there was some miscommunication regarding which shoulder was causing him pain.  In reviewing his previous PET scan, the patient appears to have joint space inflammation and hypermetabolism in the right shoulder suggestive of bursitis type changes.  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  He submits today that he does not really have significant left shoulder pain.  As a result, we will discontinue his left shoulder radiotherapy after the current level of 4 fractions to 12 Gy while continuing to treat his spine.    ________________________________  Donnice FELIX Patrcia, M.D.

## 2023-05-08 ENCOUNTER — Other Ambulatory Visit: Payer: Self-pay

## 2023-05-08 ENCOUNTER — Inpatient Hospital Stay: Payer: BC Managed Care – PPO

## 2023-05-08 ENCOUNTER — Ambulatory Visit
Admission: RE | Admit: 2023-05-08 | Discharge: 2023-05-08 | Disposition: A | Discharge status: Home / Self Care | Payer: BC Managed Care – PPO | Source: Ambulatory Visit | Attending: Radiation Oncology

## 2023-05-08 ENCOUNTER — Ambulatory Visit
Admission: RE | Admit: 2023-05-08 | Discharge: 2023-05-08 | Disposition: A | Discharge status: Home / Self Care | Payer: BC Managed Care – PPO | Source: Ambulatory Visit | Attending: Radiation Oncology | Admitting: Radiation Oncology

## 2023-05-08 VITALS — BP 151/83 | HR 89 | Temp 98.7°F | Resp 18

## 2023-05-08 DIAGNOSIS — C7951 Secondary malignant neoplasm of bone: Secondary | ICD-10-CM | POA: Diagnosis not present

## 2023-05-08 DIAGNOSIS — C186 Malignant neoplasm of descending colon: Secondary | ICD-10-CM | POA: Diagnosis not present

## 2023-05-08 DIAGNOSIS — Z510 Encounter for antineoplastic radiation therapy: Secondary | ICD-10-CM | POA: Diagnosis not present

## 2023-05-08 DIAGNOSIS — C189 Malignant neoplasm of colon, unspecified: Secondary | ICD-10-CM | POA: Diagnosis not present

## 2023-05-08 DIAGNOSIS — Z95828 Presence of other vascular implants and grafts: Secondary | ICD-10-CM

## 2023-05-08 LAB — RAD ONC ARIA SESSION SUMMARY
Course Elapsed Days: 4
Plan Fractions Treated to Date: 5
Plan Prescribed Dose Per Fraction: 3 Gy
Plan Total Fractions Prescribed: 10
Plan Total Prescribed Dose: 30 Gy
Reference Point Dosage Given to Date: 15 Gy
Reference Point Session Dosage Given: 3 Gy
Session Number: 5

## 2023-05-08 MED ORDER — PEGFILGRASTIM-CBQV 6 MG/0.6ML ~~LOC~~ SOSY
6.0000 mg | PREFILLED_SYRINGE | Freq: Once | SUBCUTANEOUS | Status: AC
  Administered 2023-05-08: 6 mg via SUBCUTANEOUS
  Filled 2023-05-08: qty 0.6

## 2023-05-08 MED ORDER — HEPARIN SOD (PORK) LOCK FLUSH 100 UNIT/ML IV SOLN
500.0000 [IU] | Freq: Once | INTRAVENOUS | Status: AC | PRN
  Administered 2023-05-08: 500 [IU]

## 2023-05-08 MED ORDER — SODIUM CHLORIDE 0.9% FLUSH
10.0000 mL | INTRAVENOUS | Status: DC | PRN
Start: 2023-05-08 — End: 2023-05-08
  Administered 2023-05-08: 10 mL

## 2023-05-08 NOTE — Progress Notes (Signed)
 PATIENT NAVIGATOR PROGRESS NOTE  Name: [name] Date: 05/08/2023 MRN: [id number]  DOB: [birth year]   Reason for visit:  Referral to [facility]  Comments:  Called and referred Mr Drumwright to either Dr Mayo or Dr Bernardine per Dr Cloretta and faxed records to [phone] and have requested all scans since Oct 2020 be uploaded to [facility].  Intake nurse indicated possible appt early March. They will call Mr Keefe for scheduling    Time spent counseling/coordinating care: 45-60 minutes

## 2023-05-11 ENCOUNTER — Other Ambulatory Visit: Payer: Self-pay

## 2023-05-11 ENCOUNTER — Ambulatory Visit
Admission: RE | Admit: 2023-05-11 | Discharge: 2023-05-11 | Disposition: A | Discharge status: Home / Self Care | Payer: BC Managed Care – PPO | Source: Ambulatory Visit | Attending: Radiation Oncology

## 2023-05-11 DIAGNOSIS — C189 Malignant neoplasm of colon, unspecified: Secondary | ICD-10-CM | POA: Diagnosis not present

## 2023-05-11 DIAGNOSIS — C186 Malignant neoplasm of descending colon: Secondary | ICD-10-CM | POA: Diagnosis not present

## 2023-05-11 DIAGNOSIS — Z510 Encounter for antineoplastic radiation therapy: Secondary | ICD-10-CM | POA: Diagnosis not present

## 2023-05-11 DIAGNOSIS — C7951 Secondary malignant neoplasm of bone: Secondary | ICD-10-CM | POA: Diagnosis not present

## 2023-05-11 LAB — RAD ONC ARIA SESSION SUMMARY
Course Elapsed Days: 7
Plan Fractions Treated to Date: 6
Plan Prescribed Dose Per Fraction: 3 Gy
Plan Total Fractions Prescribed: 10
Plan Total Prescribed Dose: 30 Gy
Reference Point Dosage Given to Date: 18 Gy
Reference Point Session Dosage Given: 3 Gy
Session Number: 6

## 2023-05-12 ENCOUNTER — Ambulatory Visit
Admission: RE | Admit: 2023-05-12 | Discharge: 2023-05-12 | Disposition: A | Discharge status: Home / Self Care | Payer: BC Managed Care – PPO | Source: Ambulatory Visit | Attending: Radiation Oncology | Admitting: Radiation Oncology

## 2023-05-12 ENCOUNTER — Other Ambulatory Visit: Payer: Self-pay

## 2023-05-12 DIAGNOSIS — Z510 Encounter for antineoplastic radiation therapy: Secondary | ICD-10-CM | POA: Diagnosis not present

## 2023-05-12 DIAGNOSIS — C7951 Secondary malignant neoplasm of bone: Secondary | ICD-10-CM | POA: Diagnosis not present

## 2023-05-12 DIAGNOSIS — C186 Malignant neoplasm of descending colon: Secondary | ICD-10-CM | POA: Diagnosis not present

## 2023-05-12 DIAGNOSIS — C189 Malignant neoplasm of colon, unspecified: Secondary | ICD-10-CM | POA: Diagnosis not present

## 2023-05-12 LAB — RAD ONC ARIA SESSION SUMMARY
Course Elapsed Days: 8
Plan Fractions Treated to Date: 7
Plan Prescribed Dose Per Fraction: 3 Gy
Plan Total Fractions Prescribed: 10
Plan Total Prescribed Dose: 30 Gy
Reference Point Dosage Given to Date: 21 Gy
Reference Point Session Dosage Given: 3 Gy
Session Number: 7

## 2023-05-13 ENCOUNTER — Ambulatory Visit
Admission: RE | Admit: 2023-05-13 | Discharge: 2023-05-13 | Disposition: A | Discharge status: Home / Self Care | Payer: BC Managed Care – PPO | Source: Ambulatory Visit | Attending: Radiation Oncology | Admitting: Radiation Oncology

## 2023-05-13 ENCOUNTER — Other Ambulatory Visit: Payer: Self-pay

## 2023-05-13 DIAGNOSIS — Z510 Encounter for antineoplastic radiation therapy: Secondary | ICD-10-CM | POA: Diagnosis not present

## 2023-05-13 DIAGNOSIS — C7951 Secondary malignant neoplasm of bone: Secondary | ICD-10-CM | POA: Diagnosis not present

## 2023-05-13 DIAGNOSIS — C186 Malignant neoplasm of descending colon: Secondary | ICD-10-CM | POA: Diagnosis not present

## 2023-05-13 DIAGNOSIS — C189 Malignant neoplasm of colon, unspecified: Secondary | ICD-10-CM | POA: Diagnosis not present

## 2023-05-13 LAB — RAD ONC ARIA SESSION SUMMARY
Course Elapsed Days: 9
Plan Fractions Treated to Date: 8
Plan Prescribed Dose Per Fraction: 3 Gy
Plan Total Fractions Prescribed: 10
Plan Total Prescribed Dose: 30 Gy
Reference Point Dosage Given to Date: 24 Gy
Reference Point Session Dosage Given: 3 Gy
Session Number: 8

## 2023-05-14 ENCOUNTER — Other Ambulatory Visit: Payer: Self-pay

## 2023-05-14 ENCOUNTER — Ambulatory Visit
Admission: RE | Admit: 2023-05-14 | Discharge: 2023-05-14 | Disposition: A | Discharge status: Home / Self Care | Payer: BC Managed Care – PPO | Source: Ambulatory Visit | Attending: Radiation Oncology | Admitting: Radiation Oncology

## 2023-05-14 DIAGNOSIS — C189 Malignant neoplasm of colon, unspecified: Secondary | ICD-10-CM | POA: Diagnosis not present

## 2023-05-14 DIAGNOSIS — C7951 Secondary malignant neoplasm of bone: Secondary | ICD-10-CM | POA: Diagnosis not present

## 2023-05-14 DIAGNOSIS — Z510 Encounter for antineoplastic radiation therapy: Secondary | ICD-10-CM | POA: Diagnosis not present

## 2023-05-14 DIAGNOSIS — C186 Malignant neoplasm of descending colon: Secondary | ICD-10-CM | POA: Diagnosis not present

## 2023-05-14 LAB — RAD ONC ARIA SESSION SUMMARY
Course Elapsed Days: 10
Plan Fractions Treated to Date: 9
Plan Prescribed Dose Per Fraction: 3 Gy
Plan Total Fractions Prescribed: 10
Plan Total Prescribed Dose: 30 Gy
Reference Point Dosage Given to Date: 27 Gy
Reference Point Session Dosage Given: 3 Gy
Session Number: 9

## 2023-05-15 ENCOUNTER — Ambulatory Visit
Admission: RE | Admit: 2023-05-15 | Discharge: 2023-05-15 | Disposition: A | Discharge status: Home / Self Care | Payer: BC Managed Care – PPO | Source: Ambulatory Visit | Attending: Radiation Oncology | Admitting: Radiation Oncology

## 2023-05-15 ENCOUNTER — Other Ambulatory Visit: Payer: Self-pay

## 2023-05-15 DIAGNOSIS — C186 Malignant neoplasm of descending colon: Secondary | ICD-10-CM | POA: Diagnosis not present

## 2023-05-15 DIAGNOSIS — C7951 Secondary malignant neoplasm of bone: Secondary | ICD-10-CM | POA: Diagnosis not present

## 2023-05-15 DIAGNOSIS — C189 Malignant neoplasm of colon, unspecified: Secondary | ICD-10-CM

## 2023-05-15 DIAGNOSIS — Z510 Encounter for antineoplastic radiation therapy: Secondary | ICD-10-CM | POA: Diagnosis not present

## 2023-05-15 LAB — RAD ONC ARIA SESSION SUMMARY
Course Elapsed Days: 11
Plan Fractions Treated to Date: 10
Plan Prescribed Dose Per Fraction: 3 Gy
Plan Total Fractions Prescribed: 10
Plan Total Prescribed Dose: 30 Gy
Reference Point Dosage Given to Date: 30 Gy
Reference Point Session Dosage Given: 3 Gy
Session Number: 10

## 2023-05-16 ENCOUNTER — Other Ambulatory Visit: Payer: Self-pay | Admitting: Oncology

## 2023-05-18 NOTE — Radiation Completion Notes (Addendum)
  Radiation Oncology         [phone]) [phone] ________________________________  Name: [name] MRN: [id number]  Date: 05/15/2023  DOB: [birth year]  Referring Physician: Thornton Papas, M.D. Date of Service: 2023-05-18 Radiation Oncologist: [name], M.D.  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   Intent: Curative     ==========DELIVERED PLANS==========  First Treatment Date: 2023-05-04 Last Treatment Date: 2023-05-15   Plan Name: Spine_T/L Site: Lumbar Spine Technique: 3D Mode: Photon Dose Per Fraction: 3 Gy Prescribed Dose (Delivered / Prescribed): 30 Gy / 30 Gy Prescribed Fxs (Delivered / Prescribed): 10 / 10   Plan Name: Chest_L_Shld_ Site: Scapula, Left Technique: 3D Mode: Photon Dose Per Fraction: 3 Gy Prescribed Dose (Delivered / Prescribed): 12 Gy / 30 Gy Prescribed Fxs (Delivered / Prescribed): 4 / 10     ==========ON TREATMENT VISIT DATES========== 2023-05-08, 2023-05-15   See weekly On Treatment Notes in Epic for details in the Media tab (listed as Progress notes on the On Treatment Visit Dates listed above).  The patient will receive a call in about one month from the radiation oncology department. He will continue follow up with his medical oncologist, [name], as well.  ------------------------------------------------   David Dys, MD [facility]  Radiation Oncology Direct Dial: [phone]  Fax: [phone] [email or web address]  Skype  LinkedIn

## 2023-05-20 ENCOUNTER — Inpatient Hospital Stay (HOSPITAL_BASED_OUTPATIENT_CLINIC_OR_DEPARTMENT_OTHER): Payer: BC Managed Care – PPO | Admitting: Nurse Practitioner

## 2023-05-20 ENCOUNTER — Encounter: Payer: Self-pay | Admitting: Nurse Practitioner

## 2023-05-20 ENCOUNTER — Inpatient Hospital Stay: Payer: BC Managed Care – PPO

## 2023-05-20 ENCOUNTER — Other Ambulatory Visit: Payer: Self-pay

## 2023-05-20 ENCOUNTER — Ambulatory Visit (HOSPITAL_BASED_OUTPATIENT_CLINIC_OR_DEPARTMENT_OTHER)
Admission: RE | Admit: 2023-05-20 | Discharge: 2023-05-20 | Disposition: A | Discharge status: Home / Self Care | Payer: BC Managed Care – PPO | Source: Ambulatory Visit | Attending: Nurse Practitioner | Admitting: Nurse Practitioner

## 2023-05-20 ENCOUNTER — Other Ambulatory Visit: Payer: Self-pay | Admitting: Nurse Practitioner

## 2023-05-20 ENCOUNTER — Other Ambulatory Visit: Payer: BC Managed Care – PPO

## 2023-05-20 ENCOUNTER — Ambulatory Visit: Payer: BC Managed Care – PPO | Admitting: Nurse Practitioner

## 2023-05-20 ENCOUNTER — Inpatient Hospital Stay: Payer: BC Managed Care – PPO | Admitting: Nutrition

## 2023-05-20 ENCOUNTER — Ambulatory Visit: Payer: BC Managed Care – PPO

## 2023-05-20 VITALS — BP 135/89 | HR 95 | Temp 98.2°F | Resp 18 | Ht 72.0 in | Wt 187.9 lb

## 2023-05-20 VITALS — BP 136/78 | HR 87 | Resp 18

## 2023-05-20 DIAGNOSIS — C189 Malignant neoplasm of colon, unspecified: Secondary | ICD-10-CM

## 2023-05-20 DIAGNOSIS — E860 Dehydration: Secondary | ICD-10-CM

## 2023-05-20 DIAGNOSIS — M419 Scoliosis, unspecified: Secondary | ICD-10-CM | POA: Diagnosis not present

## 2023-05-20 DIAGNOSIS — C7951 Secondary malignant neoplasm of bone: Secondary | ICD-10-CM | POA: Diagnosis not present

## 2023-05-20 DIAGNOSIS — C186 Malignant neoplasm of descending colon: Secondary | ICD-10-CM

## 2023-05-20 DIAGNOSIS — Z95828 Presence of other vascular implants and grafts: Secondary | ICD-10-CM

## 2023-05-20 LAB — CBC WITH DIFFERENTIAL (CANCER CENTER ONLY)
Abs Immature Granulocytes: 0.92 10*3/uL — ABNORMAL HIGH (ref 0.00–0.07)
Basophils Absolute: 0 10*3/uL (ref 0.0–0.1)
Basophils Relative: 0 %
Eosinophils Absolute: 0.2 10*3/uL (ref 0.0–0.5)
Eosinophils Relative: 2 %
HCT: 32.3 % — ABNORMAL LOW (ref 39.0–52.0)
Hemoglobin: 10.6 g/dL — ABNORMAL LOW (ref 13.0–17.0)
Immature Granulocytes: 10 %
Lymphocytes Relative: 6 %
Lymphs Abs: 0.6 10*3/uL — ABNORMAL LOW (ref 0.7–4.0)
MCH: 32.5 pg (ref 26.0–34.0)
MCHC: 32.8 g/dL (ref 30.0–36.0)
MCV: 99.1 fL (ref 80.0–100.0)
Monocytes Absolute: 1.1 10*3/uL — ABNORMAL HIGH (ref 0.1–1.0)
Monocytes Relative: 12 %
Neutro Abs: 6.8 10*3/uL (ref 1.7–7.7)
Neutrophils Relative %: 70 %
Platelet Count: 51 10*3/uL — ABNORMAL LOW (ref 150–400)
RBC: 3.26 MIL/uL — ABNORMAL LOW (ref 4.22–5.81)
RDW: 17.3 % — ABNORMAL HIGH (ref 11.5–15.5)
WBC Count: 9.6 10*3/uL (ref 4.0–10.5)
nRBC: 0.3 % — ABNORMAL HIGH (ref 0.0–0.2)

## 2023-05-20 LAB — CMP (CANCER CENTER ONLY)
ALT: 7 U/L (ref 0–44)
AST: 18 U/L (ref 15–41)
Albumin: 3.4 g/dL — ABNORMAL LOW (ref 3.5–5.0)
Alkaline Phosphatase: 1132 U/L — ABNORMAL HIGH (ref 38–126)
Anion gap: 11 (ref 5–15)
BUN: 22 mg/dL — ABNORMAL HIGH (ref 6–20)
CO2: 24 mmol/L (ref 22–32)
Calcium: 8.2 mg/dL — ABNORMAL LOW (ref 8.9–10.3)
Chloride: 97 mmol/L — ABNORMAL LOW (ref 98–111)
Creatinine: 0.88 mg/dL (ref 0.61–1.24)
GFR, Estimated: >60 mL/min (ref >60)
Glucose, Bld: 305 mg/dL — ABNORMAL HIGH (ref 70–99)
Potassium: 3.9 mmol/L (ref 3.5–5.1)
Sodium: 132 mmol/L — ABNORMAL LOW (ref 135–145)
Total Bilirubin: 0.6 mg/dL (ref 0.0–1.2)
Total Protein: 6.4 g/dL — ABNORMAL LOW (ref 6.5–8.1)

## 2023-05-20 LAB — TOTAL PROTEIN, URINE DIPSTICK: Protein, ur: 100 mg/dL — AB

## 2023-05-20 LAB — CEA (ACCESS): CEA (CHCC): 38.61 ng/mL — ABNORMAL HIGH (ref 0.00–5.00)

## 2023-05-20 MED ORDER — HEPARIN SOD (PORK) LOCK FLUSH 100 UNIT/ML IV SOLN
500.0000 [IU] | Freq: Once | INTRAVENOUS | Status: AC | PRN
  Administered 2023-05-20: 500 [IU]

## 2023-05-20 MED ORDER — SODIUM CHLORIDE 0.9 % IV SOLN: INTRAVENOUS | Status: DC

## 2023-05-20 MED ORDER — MORPHINE SULFATE (PF) 2 MG/ML IV SOLN
2.0000 mg | Freq: Once | INTRAVENOUS | Status: AC
  Administered 2023-05-20: 2 mg via INTRAVENOUS
  Filled 2023-05-20: qty 1

## 2023-05-20 MED ORDER — SODIUM CHLORIDE 0.9 % IV SOLN
INTRAVENOUS | Status: AC
Start: 2023-05-20 — End: 2023-05-20

## 2023-05-20 MED ORDER — SODIUM CHLORIDE 0.9% FLUSH
10.0000 mL | INTRAVENOUS | Status: DC | PRN
  Administered 2023-05-20: 10 mL

## 2023-05-20 NOTE — Progress Notes (Unsigned)
[facility] OFFICE PROGRESS NOTE   Diagnosis: Colon cancer  INTERVAL HISTORY:   Mr. Degregorio returns as scheduled.  He completed another cycle of FOLFIRI/bevacizumab 05/06/2023.  He completed palliative radiation to the thoracic spine 05/15/2023.  Radiation to the left shoulder was discontinued after 4 fractions due to no significant left shoulder pain.  He reports the mid back pain is better.  He continues to have low back pain, worse over the past week.  He takes hydrocodone and occasional oxycodone.  He took hydrocodone this morning but has noted no improvement in the pain.  He had more nausea after the last chemotherapy.  Appetite is poor.  He has lost some weight.  No mouth sores.  No diarrhea.  He reports recent nosebleeds.  Objective:  Vital signs in last 24 hours:  Blood pressure 135/89, pulse 95, temperature 98.2 F (36.8 C), temperature source Temporal, resp. rate 18, height 6' (1.829 m), weight 187 lb 14.4 oz (85.2 kg), SpO2 100%.    HEENT: No thrush or ulcers. Resp: Lungs clear bilaterally. Cardio: Regular rate and rhythm. GI: Abdomen soft and nontender.  No hepatosplenomegaly. Vascular: No leg edema. Neuro: Lower extremity motor strength 5/5. Port-A-Cath without erythema.  Lab Results:  Lab Results  Component Value Date   WBC 9.6 05/20/2023   HGB 10.6 (L) 05/20/2023   HCT 32.3 (L) 05/20/2023   MCV 99.1 05/20/2023   PLT 51 (L) 05/20/2023   NEUTROABS 6.8 05/20/2023    Imaging:  No results found.  Medications: I have reviewed the patient's current medications.  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, status post a left colectomy 03/04/2019 Mass felt to be in the transverse colon on a colonoscopy 01/07/2019 with a biopsy confirming poorly differentiated adenocarcinoma with signet ring cell features, mismatch repair  protein expression normal CT abdomen/pelvis 01/08/2019 12.3 cm indeterminate splenic mass, present in 2015 on a lumbar MRI-felt to be benign, small subpleural and pleural-based nodules at the lung bases, status post left nephrectomy CT chest 01/20/2019-small bilateral pulmonary nodules with rounded parenchymal nodules in the right lower lobe, indeterminate splenic mass CT chest 03/18/2019-multiple subcentimeter pulmonary nodules again seen bilaterally, greatest in the lower lobes, without significant change.  No new or enlarging pulmonary nodules or masses.  Partially visualized large heterogeneous splenic mass with no significant change.  Plan for follow-up chest CT at a 44-month interval. Cycle 1 FOLFOX 03/21/2019 Cycle 2 FOLFOX 04/04/2019 Cycle 3 FOLFOX 04/18/2019 (oxaliplatin dose reduced secondary to thrombocytopenia) Cycle 4 FOLFOX 05/02/2019 (oxaliplatin held secondary to thrombocytopenia) Cycle 5 FOLFOX 05/16/2019 Cycle 6 FOLFOX 05/30/2019 (oxaliplatin held secondary to thrombocytopenia) CT chest 06/08/2019-stable scattered small solid pulmonary nodules  Cycle 7 FOLFOX 06/13/2019 Cycle 8 FOLFOX 06/27/2019 Cycle 9 FOLFOX 07/11/2019 Cycle 10 FOLFOX 07/25/2019 (oxaliplatin held due to thrombocytopenia) Cycle 11 FOLFOX 08/08/2019 Cycle 12 FOLFOX 08/22/2019 CTs 01/09/2020-no evidence of recurrent disease, stable lung nodules favored to represent subpleural lymph nodes-dedicated follow-up not recommended CTs 01/11/2021-no evidence of recurrent disease CTs 01/24/2022-no evidence of recurrent disease CT lumbar spine, abdomen/pelvis 04/11/2022-numerous mixed lytic and sclerotic lesions scattered throughout the visualized axial and appendicular skeleton including several areas in the lumbar spine.   04/21/2022 CT biopsy sclerotic lesion right anterior iliac bone-consistent with metastatic colorectal adenocarcinoma, moderate to poorly differentiated.  Foundation 1-microsatellite stable, tumor mutation burden 4,  K-ras G12S PET scan 05/09/2022-widespread hypermetabolic mixed faintly lytic and sclerotic osseous metastases throughout the axial  and proximal appendicular skeleton; associated mild pathologic compression fracture at L3; no hypermetabolic extraosseous metastatic disease. Cycle 1 FOLFIRI/bevacizumab 05/26/2022 Cycle 2 FOLFIRI/bevacizumab 06/09/2022 Cycle 3 FOLFIRI 06/24/2022, bevacizumab 06/26/2022 Cycle 4 FOLFIRI 07/08/2022, bevacizumab held due to proteinuria Cycle 5 FOLFIRI 07/22/2022, bevacizumab held due to proteinuria CTs 07/31/2022-potentially areas of sclerosis represent interval healing; new lytic lesion at T12, multiple new areas of punched-out sclerosis throughout the visualized osseous structures.  Index lesion in the left iliac wing measures 1.3 x 2.8 cm.  Healing of previously metabolically occult metastatic disease on 05/09/2022 is a possibility. Cycle 6 FOLFIRI 08/04/2022, irinotecan dose reduced due to thrombocytopenia, bevacizumab held pending 24-hour urine result Cycle 7 FOLFIRI 08/18/2022, bevacizumab held secondary to proteinuria, G-CSF held Cycle 8 FOLFIRI 09/01/2022, bevacizumab held, G-CSF Cycle 9 FOLFIRI 09/16/2022, bevacizumab held, G-CSF CTs 09/29/2022-enlargement of a hypodense lesion posterior left lobe of the liver, similar diffuse mixed lytic and sclerotic osseous metastatic disease, multiple new small nodules medial right lung base, multiple other small bilateral pulmonary nodules unchanged. Cycle 10 FOLFIRI plus Avastin 10/08/2022 Cycle 11 FOLFIRI 10/27/2022, Avastin held Cycle 12 FOLFIRI/Avastin 11/11/2022 Cycle 13 FOLFIRI/Avastin 11/25/2022 CTs 12/08/2022-stable lesion posterior left lobe of the liver.  Unchanged diffuse lytic and sclerotic osseous metastatic disease.  Stable small solid pulmonary nodules.  Stable splenic lesions.  Slightly increased bladder wall thickening. Cycle 14 FOLFIRI/Avastin 12/10/2022 Cycle 15 FOLFIRI/Avastin 12/24/2022 12/31/2022 24-hour urine protein 710 mg Cycle  16 FOLFIRI/Avastin 01/07/2023 Cycle 17 FOLFIRI 01/21/2023, Avastin held secondary to persistent proteinuria, slight increase in creatinine, and drainage from the suprapubic lesion CTs 01/26/2023-unchanged widespread sclerotic osseous metastatic disease, unchanged hypodense liver lesions, occasional small bilateral lung nodules unchanged. Cycle 18 FOLFIRI 02/03/2023, Avastin held Cycle 19 FOLFIRI 02/17/2023, Avastin held Cycle 20 FOLFIRI 03/03/2023, Avastin held Cycle 21 FOLFIRI 03/16/2023, Avastin held CTs 04/04/2023: Right increased sclerosis of osseous metastatic disease with new sclerotic lesions of the sternum/manubrium and increase sclerotic metastases throughout the pelvis, unchanged hypodense liver lesions Cycle 22 FOLFIRI 04/07/2023, Avastin held Cycle 23 FOLFIRI/Avastin 04/22/2023 Cycle 24 FOLFIRI/Avastin 05/06/2023   Multiple colon polyps-ascending colon polyps on the colonoscopy 01/07/2019-not removed Colonoscopy 01/03/2020-multiple polyps removed, tubular adenomas, inflammatory and hyperplastic polyps Wilms tumor at age 89, 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  Left nephrectomy at age 56 12.  Port-A-Cath placement, Dr. Michaell Cowing, 03/16/2019 13.  Thrombocytopenia secondary to chemotherapy-oxaliplatin dose reduced beginning with cycle 3 FOLFOX, improved 14.  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  Right groin soft tissue infection May 2023-status postsurgical debridement 18.  Left leg DVT-common femoral, femoral, popliteal, posterior tibial, and peroneal  veins 19.  02/04/2022-MRI cervical spine-1.7 cm T1 lesion-indeterminate; bone scan 03/14/2022-focal intense activity at approximate T1 level felt to correspond to the lesion on MRI, additional foci of activity involving the lower thoracic and lumbar spine, right iliac bone and pubic symphysis.  CT lumbar spine, abdomen/pelvis 04/11/2022-numerous mixed lytic and sclerotic lesions scattered throughout the visualized axial and appendicular skeleton including several areas in the lumbar spine.  04/21/2022 CT biopsy sclerotic lesion right anterior iliac bone-consistent with metastatic colorectal adenocarcinoma, moderate to poorly differentiated 20.  Inflamed sebaceous cyst 08/18/2022-doxycycline Surgical resection of area of "hidradenitis "  03/19/2023   Disposition: Mr. Rodeheaver appears stable.  He is on active treatment with FOLFIRI/bevacizumab.  He recently completed a course of palliative radiation to the thoracic spine.  CBC from today shows moderate thrombocytopenia.  This is likely due to a combination of chemotherapy and radiation therapy.  We are holding today's treatment.  He understands to contact the office with bleeding.  He reports increased low back pain over the past week.  He took hydrocodone earlier this morning with no significant improvement.  We are giving him a dose of morphine 2 mg IV in the office today and will adjust his home pain medication regimen based on the effectiveness of the morphine.  We are contacting radiation oncology to confirm the previous treatment site.  Plan for x-ray lumbar spine today.  He will return for a follow-up appointment in 1 week.  We are available to see him sooner if needed.  Patient seen with [name].   Lonna Cobb ANP/GNP-BC   05/20/2023  9:29 AM  This was a shared visit with Lonna Cobb.  Mr. Macomber completed radiation to the thoracolumbar spine.  He continues to have severe pain in the lower back.  We adjusted the narcotic pain regimen today.   Systemic therapy will be placed on hold due to thrombocytopenia.  His clinical status has declined.  He is scheduled for consultation at [facility] in 2 weeks.  I was present for greater than 50% of today's visit.  I performed medical decision making.  Mancel Bale, MD

## 2023-05-20 NOTE — Progress Notes (Signed)
58 yo male diagnosed with Colon cancer S/P colectomy in Dec 2020. He receives FOLFIRI and Bevacizumab. S/P palliative radiation to spine.  PMH includes Wilms tumor, DM, L nephrectomy, Prostate cancer.  Medications include Tums, Decadron, Metformin, Fish oil, Actos, Compazine.  Labs include Na 132, Glucose 305, Alb 3.4 and BUN 22.  Height: 6 ft. Weight: 187 pounds 14.4 oz. Patient weighed 206 pounds 1.6 oz Feb 17, 2023. BMI: 25.48.  Positive for 9% unintentional weight loss in 3 months, concerning. His appetite is poor and he has increased nausea. No vomiting. No mouth sores. No diarrhea. Drinks 3 ONS daily. (Muscle milk, Atkins shakes, yogurt smoothie) Pain is not well controlled. He does not want to take too much pain medicine and he is still working. He does not sleep well.   Nutrition Diagnosis: Unintended weight loss related to cancer and associated treatments as evidenced by 9% loss over 3 months.  Intervention: Educated on small, frequent meals and snacks with adequate calories and protein for weight maintenance.  Reviewed strategies for adding protein and calories throughout the day. Educated on hydration.  Tips on improving nausea. Take medications as prescribed by MD. Nutrition fact sheets and contact information given.  Monitoring,Evaluation, Goals: Increase calories and protein to minimize further wt loss.  Next Visit:To be scheduled with treatment as needed.

## 2023-05-20 NOTE — Patient Instructions (Addendum)

## 2023-05-21 ENCOUNTER — Telehealth: Payer: Self-pay

## 2023-05-21 ENCOUNTER — Encounter: Payer: Self-pay | Admitting: Oncology

## 2023-05-21 NOTE — Telephone Encounter (Signed)
I spoke with the patient regarding his pain management, and he reported his pain level as 7. He has a prescription for Norco, which has not provided adequate relief. According to Misty Stanley, the patient should take 1 or 2 tablets of oxycodone immediate-release (IR) and contact us again if he does not experience relief from the oxycodone. The patient has acknowledged and understood this guidance.

## 2023-05-21 NOTE — Telephone Encounter (Signed)
-----   Message from Lonna Cobb sent at 05/20/2023  2:53 PM EST ----- Please call him 05/21/2023 to check on pain control.

## 2023-05-22 ENCOUNTER — Inpatient Hospital Stay: Payer: BC Managed Care – PPO

## 2023-05-22 ENCOUNTER — Other Ambulatory Visit: Payer: Self-pay | Admitting: Nurse Practitioner

## 2023-05-22 ENCOUNTER — Other Ambulatory Visit: Payer: BC Managed Care – PPO

## 2023-05-22 ENCOUNTER — Other Ambulatory Visit (HOSPITAL_BASED_OUTPATIENT_CLINIC_OR_DEPARTMENT_OTHER): Payer: Self-pay

## 2023-05-22 DIAGNOSIS — C189 Malignant neoplasm of colon, unspecified: Secondary | ICD-10-CM

## 2023-05-22 LAB — CBC WITH DIFFERENTIAL (CANCER CENTER ONLY)
Abs Immature Granulocytes: 1.17 10*3/uL — ABNORMAL HIGH (ref 0.00–0.07)
Basophils Absolute: 0.1 10*3/uL (ref 0.0–0.1)
Basophils Relative: 0 %
Eosinophils Absolute: 0.2 10*3/uL (ref 0.0–0.5)
Eosinophils Relative: 2 %
HCT: 30 % — ABNORMAL LOW (ref 39.0–52.0)
Hemoglobin: 10.1 g/dL — ABNORMAL LOW (ref 13.0–17.0)
Immature Granulocytes: 10 %
Lymphocytes Relative: 5 %
Lymphs Abs: 0.6 10*3/uL — ABNORMAL LOW (ref 0.7–4.0)
MCH: 33.2 pg (ref 26.0–34.0)
MCHC: 33.7 g/dL (ref 30.0–36.0)
MCV: 98.7 fL (ref 80.0–100.0)
Monocytes Absolute: 0.9 10*3/uL (ref 0.1–1.0)
Monocytes Relative: 8 %
Neutro Abs: 8.4 10*3/uL — ABNORMAL HIGH (ref 1.7–7.7)
Neutrophils Relative %: 75 %
Platelet Count: 53 10*3/uL — ABNORMAL LOW (ref 150–400)
RBC: 3.04 MIL/uL — ABNORMAL LOW (ref 4.22–5.81)
RDW: 17.9 % — ABNORMAL HIGH (ref 11.5–15.5)
WBC Count: 11.3 10*3/uL — ABNORMAL HIGH (ref 4.0–10.5)
nRBC: 0.4 % — ABNORMAL HIGH (ref 0.0–0.2)

## 2023-05-22 MED ORDER — HEPARIN SOD (PORK) LOCK FLUSH 100 UNIT/ML IV SOLN
500.0000 [IU] | INTRAVENOUS | Status: AC | PRN
  Administered 2023-05-22: 500 [IU]

## 2023-05-22 MED ORDER — OXYCODONE HCL 5 MG PO TABS
5.0000 mg | ORAL_TABLET | Freq: Four times a day (QID) | ORAL | 0 refills | Status: DC | PRN
  Filled 2023-05-22: qty 60, 8d supply, fill #0

## 2023-05-22 MED ORDER — SODIUM CHLORIDE 0.9% FLUSH
10.0000 mL | INTRAVENOUS | Status: AC | PRN
  Administered 2023-05-22: 10 mL

## 2023-05-22 MED ORDER — SODIUM CHLORIDE 0.9 % IV SOLN
INTRAVENOUS | Status: AC
Start: 2023-05-22 — End: 2023-05-22

## 2023-05-24 ENCOUNTER — Other Ambulatory Visit: Payer: Self-pay

## 2023-05-24 ENCOUNTER — Emergency Department (HOSPITAL_COMMUNITY): Payer: BC Managed Care – PPO

## 2023-05-24 ENCOUNTER — Inpatient Hospital Stay (HOSPITAL_COMMUNITY)
Admission: EM | Admit: 2023-05-24 | Discharge: 2023-05-28 | DRG: 091 | Disposition: A | Discharge status: Home / Self Care | Payer: BC Managed Care – PPO | Attending: Internal Medicine | Admitting: Internal Medicine

## 2023-05-24 DIAGNOSIS — R531 Weakness: Secondary | ICD-10-CM

## 2023-05-24 DIAGNOSIS — R29898 Other symptoms and signs involving the musculoskeletal system: Secondary | ICD-10-CM | POA: Diagnosis not present

## 2023-05-24 DIAGNOSIS — D6959 Other secondary thrombocytopenia: Secondary | ICD-10-CM | POA: Diagnosis not present

## 2023-05-24 DIAGNOSIS — Z8249 Family history of ischemic heart disease and other diseases of the circulatory system: Secondary | ICD-10-CM | POA: Diagnosis not present

## 2023-05-24 DIAGNOSIS — E785 Hyperlipidemia, unspecified: Secondary | ICD-10-CM | POA: Diagnosis not present

## 2023-05-24 DIAGNOSIS — R262 Difficulty in walking, not elsewhere classified: Secondary | ICD-10-CM | POA: Diagnosis present

## 2023-05-24 DIAGNOSIS — J45909 Unspecified asthma, uncomplicated: Secondary | ICD-10-CM | POA: Diagnosis present

## 2023-05-24 DIAGNOSIS — E871 Hypo-osmolality and hyponatremia: Secondary | ICD-10-CM | POA: Diagnosis not present

## 2023-05-24 DIAGNOSIS — Z7984 Long term (current) use of oral hypoglycemic drugs: Secondary | ICD-10-CM

## 2023-05-24 DIAGNOSIS — M47812 Spondylosis without myelopathy or radiculopathy, cervical region: Secondary | ICD-10-CM | POA: Diagnosis not present

## 2023-05-24 DIAGNOSIS — R64 Cachexia: Secondary | ICD-10-CM | POA: Diagnosis not present

## 2023-05-24 DIAGNOSIS — Z7902 Long term (current) use of antithrombotics/antiplatelets: Secondary | ICD-10-CM | POA: Diagnosis not present

## 2023-05-24 DIAGNOSIS — E43 Unspecified severe protein-calorie malnutrition: Secondary | ICD-10-CM | POA: Diagnosis not present

## 2023-05-24 DIAGNOSIS — G620 Drug-induced polyneuropathy: Secondary | ICD-10-CM | POA: Diagnosis present

## 2023-05-24 DIAGNOSIS — M47816 Spondylosis without myelopathy or radiculopathy, lumbar region: Secondary | ICD-10-CM | POA: Diagnosis not present

## 2023-05-24 DIAGNOSIS — C186 Malignant neoplasm of descending colon: Secondary | ICD-10-CM | POA: Diagnosis present

## 2023-05-24 DIAGNOSIS — Z905 Acquired absence of kidney: Secondary | ICD-10-CM

## 2023-05-24 DIAGNOSIS — Z9049 Acquired absence of other specified parts of digestive tract: Secondary | ICD-10-CM

## 2023-05-24 DIAGNOSIS — R918 Other nonspecific abnormal finding of lung field: Secondary | ICD-10-CM | POA: Diagnosis present

## 2023-05-24 DIAGNOSIS — Z86718 Personal history of other venous thrombosis and embolism: Secondary | ICD-10-CM

## 2023-05-24 DIAGNOSIS — Z794 Long term (current) use of insulin: Secondary | ICD-10-CM | POA: Diagnosis not present

## 2023-05-24 DIAGNOSIS — D539 Nutritional anemia, unspecified: Secondary | ICD-10-CM | POA: Diagnosis present

## 2023-05-24 DIAGNOSIS — Z888 Allergy status to other drugs, medicaments and biological substances status: Secondary | ICD-10-CM

## 2023-05-24 DIAGNOSIS — Z7982 Long term (current) use of aspirin: Secondary | ICD-10-CM | POA: Diagnosis not present

## 2023-05-24 DIAGNOSIS — Z95828 Presence of other vascular implants and grafts: Secondary | ICD-10-CM | POA: Diagnosis not present

## 2023-05-24 DIAGNOSIS — Z79899 Other long term (current) drug therapy: Secondary | ICD-10-CM

## 2023-05-24 DIAGNOSIS — T380X5A Adverse effect of glucocorticoids and synthetic analogues, initial encounter: Secondary | ICD-10-CM | POA: Diagnosis present

## 2023-05-24 DIAGNOSIS — M4802 Spinal stenosis, cervical region: Secondary | ICD-10-CM | POA: Diagnosis not present

## 2023-05-24 DIAGNOSIS — Z87440 Personal history of urinary (tract) infections: Secondary | ICD-10-CM

## 2023-05-24 DIAGNOSIS — E119 Type 2 diabetes mellitus without complications: Secondary | ICD-10-CM

## 2023-05-24 DIAGNOSIS — R400 Somnolence: Secondary | ICD-10-CM | POA: Diagnosis not present

## 2023-05-24 DIAGNOSIS — M47814 Spondylosis without myelopathy or radiculopathy, thoracic region: Secondary | ICD-10-CM | POA: Diagnosis not present

## 2023-05-24 DIAGNOSIS — Z9221 Personal history of antineoplastic chemotherapy: Secondary | ICD-10-CM

## 2023-05-24 DIAGNOSIS — Z1152 Encounter for screening for COVID-19: Secondary | ICD-10-CM | POA: Diagnosis not present

## 2023-05-24 DIAGNOSIS — C189 Malignant neoplasm of colon, unspecified: Secondary | ICD-10-CM | POA: Diagnosis not present

## 2023-05-24 DIAGNOSIS — E1165 Type 2 diabetes mellitus with hyperglycemia: Secondary | ICD-10-CM | POA: Diagnosis not present

## 2023-05-24 DIAGNOSIS — Z800 Family history of malignant neoplasm of digestive organs: Secondary | ICD-10-CM

## 2023-05-24 DIAGNOSIS — K769 Liver disease, unspecified: Secondary | ICD-10-CM | POA: Diagnosis not present

## 2023-05-24 DIAGNOSIS — Z923 Personal history of irradiation: Secondary | ICD-10-CM

## 2023-05-24 DIAGNOSIS — I6782 Cerebral ischemia: Secondary | ICD-10-CM | POA: Diagnosis not present

## 2023-05-24 DIAGNOSIS — C7951 Secondary malignant neoplasm of bone: Secondary | ICD-10-CM | POA: Diagnosis not present

## 2023-05-24 DIAGNOSIS — C801 Malignant (primary) neoplasm, unspecified: Secondary | ICD-10-CM | POA: Diagnosis not present

## 2023-05-24 DIAGNOSIS — R809 Proteinuria, unspecified: Secondary | ICD-10-CM | POA: Diagnosis not present

## 2023-05-24 DIAGNOSIS — Z6826 Body mass index (BMI) 26.0-26.9, adult: Secondary | ICD-10-CM

## 2023-05-24 DIAGNOSIS — M519 Unspecified thoracic, thoracolumbar and lumbosacral intervertebral disc disorder: Secondary | ICD-10-CM | POA: Diagnosis present

## 2023-05-24 DIAGNOSIS — I10 Essential (primary) hypertension: Secondary | ICD-10-CM | POA: Diagnosis not present

## 2023-05-24 DIAGNOSIS — Z8546 Personal history of malignant neoplasm of prostate: Secondary | ICD-10-CM

## 2023-05-24 DIAGNOSIS — M549 Dorsalgia, unspecified: Secondary | ICD-10-CM | POA: Diagnosis present

## 2023-05-24 DIAGNOSIS — M48061 Spinal stenosis, lumbar region without neurogenic claudication: Secondary | ICD-10-CM | POA: Diagnosis present

## 2023-05-24 DIAGNOSIS — G720 Drug-induced myopathy: Secondary | ICD-10-CM | POA: Diagnosis not present

## 2023-05-24 DIAGNOSIS — M545 Low back pain, unspecified: Secondary | ICD-10-CM | POA: Diagnosis not present

## 2023-05-24 DIAGNOSIS — Z823 Family history of stroke: Secondary | ICD-10-CM

## 2023-05-24 DIAGNOSIS — T451X5A Adverse effect of antineoplastic and immunosuppressive drugs, initial encounter: Secondary | ICD-10-CM | POA: Diagnosis present

## 2023-05-24 DIAGNOSIS — M419 Scoliosis, unspecified: Secondary | ICD-10-CM | POA: Diagnosis present

## 2023-05-24 DIAGNOSIS — Z85038 Personal history of other malignant neoplasm of large intestine: Secondary | ICD-10-CM | POA: Diagnosis not present

## 2023-05-24 DIAGNOSIS — G893 Neoplasm related pain (acute) (chronic): Secondary | ICD-10-CM | POA: Diagnosis not present

## 2023-05-24 DIAGNOSIS — Z85528 Personal history of other malignant neoplasm of kidney: Secondary | ICD-10-CM

## 2023-05-24 DIAGNOSIS — R7401 Elevation of levels of liver transaminase levels: Secondary | ICD-10-CM | POA: Diagnosis present

## 2023-05-24 LAB — CBC WITH DIFFERENTIAL/PLATELET
Abs Immature Granulocytes: 0.91 10*3/uL — ABNORMAL HIGH (ref 0.00–0.07)
Basophils Absolute: 0 10*3/uL (ref 0.0–0.1)
Basophils Relative: 0 %
Eosinophils Absolute: 0.5 10*3/uL (ref 0.0–0.5)
Eosinophils Relative: 4 %
HCT: 28.9 % — ABNORMAL LOW (ref 39.0–52.0)
Hemoglobin: 9.1 g/dL — ABNORMAL LOW (ref 13.0–17.0)
Immature Granulocytes: 8 %
Lymphocytes Relative: 4 %
Lymphs Abs: 0.5 10*3/uL — ABNORMAL LOW (ref 0.7–4.0)
MCH: 32.5 pg (ref 26.0–34.0)
MCHC: 31.5 g/dL (ref 30.0–36.0)
MCV: 103.2 fL — ABNORMAL HIGH (ref 80.0–100.0)
Monocytes Absolute: 1 10*3/uL (ref 0.1–1.0)
Monocytes Relative: 9 %
Neutro Abs: 8.8 10*3/uL — ABNORMAL HIGH (ref 1.7–7.7)
Neutrophils Relative %: 75 %
Platelets: UNDETERMINED 10*3/uL (ref 150–400)
RBC: 2.8 MIL/uL — ABNORMAL LOW (ref 4.22–5.81)
RDW: 18.3 % — ABNORMAL HIGH (ref 11.5–15.5)
WBC: 11.7 10*3/uL — ABNORMAL HIGH (ref 4.0–10.5)
nRBC: 0.2 % (ref 0.0–0.2)

## 2023-05-24 LAB — TROPONIN I (HIGH SENSITIVITY): Troponin I (High Sensitivity): 6 ng/L (ref <18)

## 2023-05-24 LAB — COMPREHENSIVE METABOLIC PANEL
ALT: 9 U/L (ref 0–44)
AST: 17 U/L (ref 15–41)
Albumin: 2.2 g/dL — ABNORMAL LOW (ref 3.5–5.0)
Alkaline Phosphatase: 681 U/L — ABNORMAL HIGH (ref 38–126)
Anion gap: 9 (ref 5–15)
BUN: 25 mg/dL — ABNORMAL HIGH (ref 6–20)
CO2: 19 mmol/L — ABNORMAL LOW (ref 22–32)
Calcium: 7.1 mg/dL — ABNORMAL LOW (ref 8.9–10.3)
Chloride: 104 mmol/L (ref 98–111)
Creatinine, Ser: 0.48 mg/dL — ABNORMAL LOW (ref 0.61–1.24)
GFR, Estimated: >60 mL/min (ref >60)
Glucose, Bld: 177 mg/dL — ABNORMAL HIGH (ref 70–99)
Potassium: 4 mmol/L (ref 3.5–5.1)
Sodium: 132 mmol/L — ABNORMAL LOW (ref 135–145)
Total Bilirubin: 0.5 mg/dL (ref 0.0–1.2)
Total Protein: 5 g/dL — ABNORMAL LOW (ref 6.5–8.1)

## 2023-05-24 LAB — CBG MONITORING, ED: Glucose-Capillary: 155 mg/dL — ABNORMAL HIGH (ref 70–99)

## 2023-05-24 LAB — RESP PANEL BY RT-PCR (RSV, FLU A&B, COVID)  RVPGX2
Influenza A by PCR: NEGATIVE
Influenza B by PCR: NEGATIVE
Resp Syncytial Virus by PCR: NEGATIVE
SARS Coronavirus 2 by RT PCR: NEGATIVE

## 2023-05-24 LAB — MAGNESIUM: Magnesium: 1.4 mg/dL — ABNORMAL LOW (ref 1.7–2.4)

## 2023-05-24 MED ORDER — ONDANSETRON HCL 4 MG/2ML IJ SOLN
4.0000 mg | Freq: Four times a day (QID) | INTRAMUSCULAR | Status: DC | PRN
  Administered 2023-05-24 – 2023-05-25 (×2): 4 mg via INTRAVENOUS
  Filled 2023-05-24 (×2): qty 2

## 2023-05-24 MED ORDER — INSULIN ASPART 100 UNIT/ML IJ SOLN
0.0000 [IU] | Freq: Three times a day (TID) | INTRAMUSCULAR | Status: DC
  Administered 2023-05-25: 4 [IU] via SUBCUTANEOUS
  Administered 2023-05-25: 3 [IU] via SUBCUTANEOUS
  Administered 2023-05-25: 11 [IU] via SUBCUTANEOUS
  Administered 2023-05-26: 4 [IU] via SUBCUTANEOUS
  Administered 2023-05-26: 11 [IU] via SUBCUTANEOUS
  Administered 2023-05-26: 4 [IU] via SUBCUTANEOUS
  Administered 2023-05-27 (×2): 11 [IU] via SUBCUTANEOUS
  Administered 2023-05-27: 4 [IU] via SUBCUTANEOUS
  Administered 2023-05-28: 3 [IU] via SUBCUTANEOUS
  Filled 2023-05-24: qty 0.2

## 2023-05-24 MED ORDER — HYDROMORPHONE HCL 1 MG/ML IJ SOLN
1.0000 mg | INTRAMUSCULAR | Status: DC | PRN
  Administered 2023-05-24 – 2023-05-28 (×11): 1 mg via INTRAVENOUS
  Filled 2023-05-24 (×11): qty 1

## 2023-05-24 MED ORDER — LORAZEPAM 2 MG/ML IJ SOLN
1.0000 mg | INTRAMUSCULAR | Status: AC | PRN
  Administered 2023-05-24: 1 mg via INTRAVENOUS
  Filled 2023-05-24: qty 1

## 2023-05-24 MED ORDER — FENTANYL CITRATE PF 50 MCG/ML IJ SOSY
50.0000 ug | PREFILLED_SYRINGE | Freq: Once | INTRAMUSCULAR | Status: AC
  Administered 2023-05-24: 50 ug via INTRAVENOUS
  Filled 2023-05-24: qty 1

## 2023-05-24 MED ORDER — LACTATED RINGERS IV SOLN: INTRAVENOUS | Status: AC

## 2023-05-24 MED ORDER — MAGNESIUM SULFATE 2 GM/50ML IV SOLN
2.0000 g | Freq: Once | INTRAVENOUS | Status: AC
  Administered 2023-05-24: 2 g via INTRAVENOUS
  Filled 2023-05-24: qty 50

## 2023-05-24 MED ORDER — ENOXAPARIN SODIUM 40 MG/0.4ML IJ SOSY
40.0000 mg | PREFILLED_SYRINGE | INTRAMUSCULAR | Status: DC
  Administered 2023-05-25: 40 mg via SUBCUTANEOUS
  Filled 2023-05-24: qty 0.4

## 2023-05-24 MED ORDER — LACTATED RINGERS IV BOLUS
1000.0000 mL | Freq: Once | INTRAVENOUS | Status: AC
  Administered 2023-05-24: 1000 mL via INTRAVENOUS

## 2023-05-24 MED ORDER — ONDANSETRON HCL 4 MG PO TABS: 4.0000 mg | ORAL_TABLET | Freq: Four times a day (QID) | ORAL | Status: DC | PRN

## 2023-05-24 MED ORDER — DEXAMETHASONE 4 MG PO TABS
4.0000 mg | ORAL_TABLET | Freq: Every day | ORAL | Status: DC
Start: 2023-05-25 — End: 2023-05-25
  Administered 2023-05-25: 4 mg via ORAL
  Filled 2023-05-24: qty 1

## 2023-05-24 MED ORDER — INSULIN ASPART 100 UNIT/ML IJ SOLN
0.0000 [IU] | Freq: Every day | INTRAMUSCULAR | Status: DC
  Administered 2023-05-25 – 2023-05-26 (×2): 2 [IU] via SUBCUTANEOUS
  Administered 2023-05-27: 3 [IU] via SUBCUTANEOUS
  Filled 2023-05-24: qty 0.05

## 2023-05-24 MED ORDER — GADOBUTROL 1 MMOL/ML IV SOLN
8.0000 mL | Freq: Once | INTRAVENOUS | Status: AC | PRN
Start: 2023-05-24 — End: 2023-05-24
  Administered 2023-05-24: 8 mL via INTRAVENOUS

## 2023-05-24 NOTE — ED Notes (Signed)
 Pt. CBG 155, RN made aware.

## 2023-05-24 NOTE — ED Provider Notes (Signed)
 [place] EMERGENCY DEPARTMENT AT [facility] Provider Note   CSN: [id number] Arrival date & time: 05/24/23  1715     History  Chief Complaint  Patient presents with   Weakness    [name] is a 58 y.o. male.   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  The patient had recently undergone palliative radiation therapy of the spine in the setting of mets to the spine.  He follows outpatient with [name] and was last seen in clinic on 2/19.  Over the past week he has had persistent ambulatory dysfunction and bilateral lower extremity weakness to the point where he is now having difficulty ambulating and raising both of his legs.  He endorses worsening back pain.  He denies any urinary or fecal incontinence or saddle anesthesia, no recent falls or trauma.  He endorses generalized weakness and fatigue.  Denies any upper extremity weakness or facial droop.  Home Medications Prior to Admission medications   Medication Sig Start Date End Date Taking? Authorizing Provider  acetaminophen (TYLENOL) 500 MG tablet Take 1 tablet (500 mg total) by mouth daily as needed for mild pain. 12/29/21  Yes Redwine, Madison A, PA-C  aspirin EC 81 MG EC tablet Take 1 tablet (81 mg total) by mouth daily. Swallow whole. 05/15/20  Yes Roddenberry, Cecille Amsterdam, PA-C  atorvastatin (LIPITOR) 10 MG tablet Take 1 tablet (10 mg total) by mouth daily. 03/15/14  Yes Angiulli, Mcarthur Rossetti, PA-C  calcium carbonate (TUMS EX) 750 MG chewable tablet Chew 1,500 mg by mouth daily as needed for heartburn.   Yes [provider]  dabigatran (PRADAXA) 150 MG CAPS capsule Take 150 mg by mouth 2 (two) times daily.   Yes [provider]  dexamethasone  (DECADRON) 4 MG tablet Take 1 tablet (4 mg total) by mouth daily. Start on 05/15/2023 05/06/23  Yes Ladene Artist, MD  HYDROcodone-acetaminophen (NORCO) 5-325 MG tablet Take 1-2 tablets by mouth every 6 (six) hours as needed for moderate pain (pain score 4-6). Do not drive while taking 4/0/98  Yes Ladene Artist, MD  ibuprofen (ADVIL) 200 MG tablet Take 200 mg by mouth daily as needed for mild pain.   Yes [provider]  insulin glargine, 1 Unit Dial, (TOUJEO SOLOSTAR) 300 UNIT/ML Solostar Pen Inject 41 Units into the skin daily. 04/05/21  Yes [provider]  lidocaine-prilocaine (EMLA) cream Apply 1 Application topically as needed.   Yes [provider]  lisinopril (ZESTRIL) 5 MG tablet Take 5 mg by mouth daily. 05/14/23  Yes [provider]  loratadine (CLARITIN) 10 MG tablet Take 10 mg by mouth daily as needed for allergies.   Yes [provider]  metFORMIN (GLUCOPHAGE) 1000 MG tablet Take 1 tablet (1,000 mg total) by mouth 2 (two) times daily. 02/12/20  Yes Corky Crafts, MD  metoprolol succinate (TOPROL-XL) 25 MG 24 hr tablet Take 25 mg by mouth daily.   Yes [provider]  mometasone (ASMANEX) 220 MCG/INH inhaler Inhale 1 puff into the lungs daily.   Yes [provider]  Omega-3 Fatty Acids (FISH OIL PO) Take 1,000 mg by mouth 2 (two) times daily.   Yes [provider]  oxyCODONE (OXY IR/ROXICODONE) 5 MG immediate release tablet Take 1-2 tablets (5-10  mg total) by mouth every 6 (six) hours as needed for severe pain (pain score 7-10). For pain not relieved with hydrocodone 05/22/23  Yes Rana Snare, NP  pioglitazone (ACTOS) 30 MG tablet Take 30 mg by mouth daily.   Yes [provider]  repaglinide (PRANDIN) 2 MG tablet Take 2 mg by mouth 2 (two) times daily. 05/16/20  Yes [provider]  traMADol (ULTRAM) 50 MG tablet Take 1 tablet (50 mg total) by mouth every 6 (six) hours as needed for moderate  pain (pain score 4-6). 05/06/23  Yes Ladene Artist, MD  albuterol (PROVENTIL HFA;VENTOLIN HFA) 108 (90 BASE) MCG/ACT inhaler Inhale 1-2 puffs into the lungs every 6 (six) hours as needed for wheezing or shortness of breath. Patient not taking: Reported on 05/06/2023    [provider]  cetirizine (ZYRTEC) 10 MG tablet Take 10 mg by mouth at bedtime. Patient not taking: Reported on 04/07/2023    [provider]  fluticasone (FLONASE) 50 MCG/ACT nasal spray Place 1 spray into both nostrils daily as needed for allergies or rhinitis. Patient not taking: Reported on 04/07/2023    [provider]  lisinopril (ZESTRIL) 10 MG tablet Take 1 tablet (10 mg total) by mouth daily. 04/04/22   Corky Crafts, MD  methocarbamol (ROBAXIN) 500 MG tablet Take 1 tablet (500 mg total) by mouth 2 (two) times daily. Patient not taking: Reported on 04/07/2023 12/29/21   Redwine, Madison A, PA-C  ONE TOUCH ULTRA TEST test strip 1 each by Other route as needed (GLUCOSE).  05/06/15   [provider]  prochlorperazine (COMPAZINE) 10 MG tablet TAKE 1 TABLET BY MOUTH EVERY 6 HOURS AS NEEDED FOR NAUSEA OR VOMITING. 03/09/23   Ladene Artist, MD      Allergies    Cat dander and Empagliflozin    Review of Systems   Review of Systems  Neurological:  Positive for weakness.  All other systems reviewed and are negative.   Physical Exam Updated Vital Signs BP (!) 140/86   Pulse 92   Temp 97.9 F (36.6 C)   Resp 18   Ht 5\' 11"  (1.803 m)   Wt 84.8 kg   SpO2 95%   BMI 26.08 kg/m  Physical Exam Vitals and nursing note reviewed.  Constitutional:      General: He is not in acute distress.    Appearance: He is well-developed.  HENT:     Head: Normocephalic and atraumatic.  Eyes:     Conjunctiva/sclera: Conjunctivae normal.     Pupils: Pupils are equal, round, and reactive to light.  Cardiovascular:     Rate and Rhythm: Normal rate and regular rhythm.  Pulmonary:     Effort:  Pulmonary effort is normal. No respiratory distress.     Breath sounds: Normal breath sounds.  Chest:     Comments: Port a cath in place along the right chest wall Abdominal:     General: There is no distension.     Palpations: Abdomen is soft.     Tenderness: There is no abdominal tenderness. There is no guarding.  Musculoskeletal:        General: No swelling, deformity or signs of injury.     Cervical back: Neck supple.  Skin:    General: Skin is warm and dry.     Capillary Refill: Capillary refill takes less than 2 seconds.     Findings: No lesion or rash.  Neurological:     General: No focal  deficit present.     Mental Status: He is alert. Mental status is at baseline.     Comments: No cranial nerve deficit, 5 out of 5 strength in the bilateral upper extremities, no sensory deficit, 3-5 strength in the bilateral lower extremities.  Psychiatric:        Mood and Affect: Mood normal.     ED Results / Procedures / Treatments   Labs (all labs ordered are listed, but only abnormal results are displayed) Labs Reviewed  CBC WITH DIFFERENTIAL/PLATELET - Abnormal; Notable for the following components:      Result Value   WBC 11.7 (*)    RBC 2.80 (*)    Hemoglobin 9.1 (*)    HCT 28.9 (*)    MCV 103.2 (*)    RDW 18.3 (*)    Neutro Abs 8.8 (*)    Lymphs Abs 0.5 (*)    Abs Immature Granulocytes 0.91 (*)    All other components within normal limits  COMPREHENSIVE METABOLIC PANEL - Abnormal; Notable for the following components:   Sodium 132 (*)    CO2 19 (*)    Glucose, Bld 177 (*)    BUN 25 (*)    Creatinine, Ser 0.48 (*)    Calcium 7.1 (*)    Total Protein 5.0 (*)    Albumin 2.2 (*)    Alkaline Phosphatase 681 (*)    All other components within normal limits  MAGNESIUM - Abnormal; Notable for the following components:   Magnesium 1.4 (*)    All other components within normal limits  CREATININE, SERUM - Abnormal; Notable for the following components:   Creatinine, Ser  0.57 (*)    All other components within normal limits  COMPREHENSIVE METABOLIC PANEL - Abnormal; Notable for the following components:   Sodium 132 (*)    Glucose, Bld 123 (*)    BUN 21 (*)    Calcium 8.1 (*)    Total Protein 6.1 (*)    Albumin 2.4 (*)    Alkaline Phosphatase 794 (*)    All other components within normal limits  CBC - Abnormal; Notable for the following components:   RBC 2.88 (*)    Hemoglobin 9.4 (*)    HCT 29.7 (*)    MCV 103.1 (*)    RDW 18.6 (*)    Platelets 72 (*)    All other components within normal limits  CBG MONITORING, ED - Abnormal; Notable for the following components:   Glucose-Capillary 155 (*)    All other components within normal limits  CBG MONITORING, ED - Abnormal; Notable for the following components:   Glucose-Capillary 139 (*)    All other components within normal limits  RESP PANEL BY RT-PCR (RSV, FLU A&B, COVID)  RVPGX2  HEMOGLOBIN A1C  HIV ANTIBODY (ROUTINE TESTING W REFLEX)  TROPONIN I (HIGH SENSITIVITY)    EKG EKG Interpretation Date/Time:  Sunday May 24 2023 17:38:56 EST Ventricular Rate:  99 PR Interval:  151 QRS Duration:  93 QT Interval:  317 QTC Calculation: 407 R Axis:   106  Text Interpretation: Sinus rhythm Right axis deviation Baseline wander in lead(s) II III aVF V4 Confirmed by Ernie Avena ([id number]) on 05/24/2023 7:29:44 PM  Radiology MR Lumbar Spine W Wo Contrast Result Date: 05/24/2023 CLINICAL DATA:  Metastatic disease evaluation EXAM: MRI THORACIC AND LUMBAR SPINE WITHOUT AND WITH CONTRAST TECHNIQUE: Multiplanar and multiecho pulse sequences of the thoracic and lumbar spine were obtained without and with intravenous contrast. CONTRAST:  8mL  GADAVIST GADOBUTROL 1 MMOL/ML IV SOLN COMPARISON:  None Available. FINDINGS: MRI THORACIC SPINE FINDINGS Alignment:  Levoscoliosis.  No static subluxation. Vertebrae: There is diffuse osseous metastatic disease affecting all of the thoracic levels. There is no acute  compression fracture. Cord:  Normal signal and morphology. Paraspinal and other soft tissues: Limited visualization due to motion. Disc levels: There is no spinal canal stenosis. No abnormal epidural soft tissue. MRI LUMBAR SPINE FINDINGS Segmentation:  Standard. Alignment: S shaped scoliosis with right apex at L1 and left apex at L5. Vertebrae: Diffuse osseous metastatic disease throughout the lumbar spine and sacrum. There is mild multilevel height loss but no acute fracture. Conus medullaris: Extends to the L1 level and appears normal. Paraspinal and other soft tissues: Incompletely visualized left abdominal mass measuring at least 11.6 cm. Disc levels: L1-L2: Normal disc space and facet joints. No spinal canal stenosis. No neural foraminal stenosis. L2-L3: Small disc bulge. No spinal canal stenosis. No neural foraminal stenosis. L3-L4: Small disc bulge. No spinal canal stenosis. Mild bilateral neural foraminal stenosis. L4-L5: Small right asymmetric disc bulge and mild facet arthrosis. No spinal canal stenosis. Worsened severe right and moderate left neural foraminal stenosis. L5-S1: Normal disc space and facet joints. No spinal canal stenosis. No neural foraminal stenosis. Visualized sacrum: Normal. IMPRESSION: 1. Diffuse osseous metastatic disease throughout the thoracic and lumbar spine and sacrum. No acute fracture or spinal canal stenosis. 2. Incompletely visualized left abdominal mass measuring at least 11.6 cm. 3. Worsened severe right and moderate left L4-L5 neural foraminal stenosis due to combination of disc bulge and facet arthrosis. Electronically Signed   By: Deatra Robinson M.D.   On: 05/24/2023 21:51   MR THORACIC SPINE W WO CONTRAST Result Date: 05/24/2023 CLINICAL DATA:  Metastatic disease evaluation EXAM: MRI THORACIC AND LUMBAR SPINE WITHOUT AND WITH CONTRAST TECHNIQUE: Multiplanar and multiecho pulse sequences of the thoracic and lumbar spine were obtained without and with intravenous  contrast. CONTRAST:  8mL GADAVIST GADOBUTROL 1 MMOL/ML IV SOLN COMPARISON:  None Available. FINDINGS: MRI THORACIC SPINE FINDINGS Alignment:  Levoscoliosis.  No static subluxation. Vertebrae: There is diffuse osseous metastatic disease affecting all of the thoracic levels. There is no acute compression fracture. Cord:  Normal signal and morphology. Paraspinal and other soft tissues: Limited visualization due to motion. Disc levels: There is no spinal canal stenosis. No abnormal epidural soft tissue. MRI LUMBAR SPINE FINDINGS Segmentation:  Standard. Alignment: S shaped scoliosis with right apex at L1 and left apex at L5. Vertebrae: Diffuse osseous metastatic disease throughout the lumbar spine and sacrum. There is mild multilevel height loss but no acute fracture. Conus medullaris: Extends to the L1 level and appears normal. Paraspinal and other soft tissues: Incompletely visualized left abdominal mass measuring at least 11.6 cm. Disc levels: L1-L2: Normal disc space and facet joints. No spinal canal stenosis. No neural foraminal stenosis. L2-L3: Small disc bulge. No spinal canal stenosis. No neural foraminal stenosis. L3-L4: Small disc bulge. No spinal canal stenosis. Mild bilateral neural foraminal stenosis. L4-L5: Small right asymmetric disc bulge and mild facet arthrosis. No spinal canal stenosis. Worsened severe right and moderate left neural foraminal stenosis. L5-S1: Normal disc space and facet joints. No spinal canal stenosis. No neural foraminal stenosis. Visualized sacrum: Normal. IMPRESSION: 1. Diffuse osseous metastatic disease throughout the thoracic and lumbar spine and sacrum. No acute fracture or spinal canal stenosis. 2. Incompletely visualized left abdominal mass measuring at least 11.6 cm. 3. Worsened severe right and moderate left L4-L5 neural foraminal stenosis  due to combination of disc bulge and facet arthrosis. Electronically Signed   By: Deatra Robinson M.D.   On: 05/24/2023 21:51     Procedures Procedures    Medications Ordered in ED Medications  dexamethasone (DECADRON) tablet 4 mg (has no administration in time range)  insulin aspart (novoLOG) injection 0-20 Units (3 Units Subcutaneous Given 05/25/23 0837)  insulin aspart (novoLOG) injection 0-5 Units (0 Units Subcutaneous Not Given 05/25/23 0047)  enoxaparin (LOVENOX) injection 40 mg (has no administration in time range)  lactated ringers infusion ( Intravenous New Bag/Given 05/25/23 0112)  HYDROmorphone (DILAUDID) injection 1 mg (1 mg Intravenous Given 05/25/23 0806)  ondansetron (ZOFRAN) tablet 4 mg ( Oral See Alternative 05/24/23 2331)    Or  ondansetron (ZOFRAN) injection 4 mg (4 mg Intravenous Given 05/24/23 2331)  LORazepam (ATIVAN) injection 1 mg (1 mg Intravenous Given 05/24/23 1956)  fentaNYL (SUBLIMAZE) injection 50 mcg (50 mcg Intravenous Given 05/24/23 1939)  lactated ringers bolus 1,000 mL (0 mLs Intravenous Stopped 05/24/23 2311)  magnesium sulfate IVPB 2 g 50 mL (0 g Intravenous Stopped 05/24/23 2311)  gadobutrol (GADAVIST) 1 MMOL/ML injection 8 mL (8 mLs Intravenous Contrast Given 05/24/23 2135)  fentaNYL (SUBLIMAZE) injection 50 mcg (50 mcg Intravenous Given 05/24/23 2200)    ED Course/ Medical Decision Making/ A&P Clinical Course as of 05/25/23 1610  Wynelle Link May 24, 2023  2042 Magnesium(!): 1.4 [JL]    Clinical Course User Index [JL] Ernie Avena, MD                                 Medical Decision Making Amount and/or Complexity of Data Reviewed Labs: ordered. Decision-making details documented in ED Course. Radiology: ordered.  Risk Prescription drug management. Decision regarding hospitalization.    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  The  patient had recently undergone palliative radiation therapy of the spine in the setting of mets to the spine.  He follows outpatient with [name] and was last seen in clinic on 2/19.  Over the past week he has had persistent ambulatory dysfunction and bilateral lower extremity weakness to the point where he is now having difficulty ambulating and raising both of his legs.  He endorses worsening back pain.  He denies any urinary or fecal incontinence or saddle anesthesia, no recent falls or trauma.  He endorses generalized weakness and fatigue.  Denies any upper extremity weakness or facial droop.  On arrival, the patient was afebrile, temperature 99.6, tachycardic heart rate 106, BP 141/96, saturating 100% on room air.  Physical exam revealed bilateral lower extremity weakness 3 out of 5 strength noted bilaterally.  Patient having worsening back pain.  In the setting of metastatic disease to the spine, concern for worsening tumor burden causing myelopathy in the bilateral lower extremity weakness.  Symptoms are ongoing for the last 5 to 7 days.  Will obtain MRI imaging of the lumbar and thoracic spine to further evaluate.  Additionally given the patient's profuse weakness, considered electrolyte abnormality, dehydration.  The patient's port was accessed and screening labs were obtained.  The patient was administered IV fluid bolus in addition IV fentanyl for pain control.  Labs: WBC 11.7, Hgb 9.1, CMP without AKI, corrected Ca close to normal  MR Thoracic  and Lumbar Spine:  IMPRESSION:  1. Diffuse osseous metastatic disease throughout the thoracic and  lumbar spine and sacrum. No acute fracture or spinal canal stenosis.  2. Incompletely visualized left abdominal mass measuring at least  11.6 cm.  3. Worsened severe right and moderate left L4-L5 neural foraminal  stenosis due to combination of disc bulge and facet arthrosis.    Pt informed of findings. No cauda equina syndrome. Pt follows with  [name] of oncology. Given his worsening pain and functional limitations with difficulty ambulating, medicine consulted for admission, Dr. Mikeal Hawthorne accepting.   Final Clinical Impression(s) / ED Diagnoses Final diagnoses:  Cancer, metastatic to bone (HCC)  Weakness of both lower extremities    Rx / DC Orders ED Discharge Orders     None         Ernie Avena, MD 05/25/23 [phone]

## 2023-05-24 NOTE — H&P (Signed)
 History and Physical    Patient: [name] UJW:[id number] DOB: [birth year] DOA: 05/24/2023 DOS: the patient was seen and examined on 05/24/2023 PCP: David Rua, MD  Patient coming from: Home  Chief Complaint:  Chief Complaint  Patient presents with   Weakness   HPI: [name] is a 58 y.o. male with medical history significant of metastatic colon cancer to the bones, type 2 diabetes, history of discitis of lumbar region, essential hypertension who presents to the ER with severe low back pain and unable to walk on his feet.  Patient has been seen oncologist on chemotherapy with the last infusion on February 21 2 days ago.  Patient is followed by Dr. [name] coming today to the ER with inability to walk and severe low back pain.  Patient also has increased fatigue.  He did have radiation 2 weeks ago also and since then he has been having increased pain in the shoulders back spine and the hips.  Patient has been on Compazine for nausea which he took at home.  He is on multiple pain medications including oxycodone hydrocodone and tramadol but none has relieve his pain.  In the ER attempt to walk the patient failed.  Further imaging studies showed metastatic disease to the lumbar spine but has an L4-L5 severe stenosis with disc bulge.  Patient has been admitted for further evaluation and treatment.  No evidence of cauda equina syndrome.  Review of Systems: As mentioned in the history of present illness. All other systems reviewed and are negative. 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   History of hiatal hernia    Hyperlipidemia    Hypertension    Diagnostic exercise tolerance test assessment:04/30/2010 : comments normal -no evidence os ischemia by ST analysis   lipoma    Lipoma 01/26/2019   Mitral regurgitation    Echo 12/2018: EF 60-65, myxomatous mitral valve, severe mitral regurgitation, partial flail leaflet of posterior mitral valve, myxomatous tricuspid valve with trivial TR, ascending aorta mildly dilated (39 mm)   Pneumonia    as child   PONV (postoperative nausea and vomiting)    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   Complex valvuloplasty including artificial Gore-tex neochord placement x6 with 28 mm Sorin Memo 4D ring annuloplasty via right mini thoracotomy approach   Solitary kidney    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   Past Surgical History:  Procedure Laterality Date   ABDOMINAL AORTOGRAM N/A 02/10/2020   Procedure: ABDOMINAL AORTOGRAM;  Surgeon: Corky Crafts, MD;  Location[facility] INVASIVE CV LAB;  Service: Cardiovascular;  Laterality: N/A;   APPLICATION OF WOUND VAC  08/15/2021   Procedure: APPLICATION OF WOUND VAC;  Surgeon: Emelia Loron, MD;  Location: [facility] OR;  Service: General;;   BIOPSY  01/07/2019   Procedure: BIOPSY;  Surgeon: Charlott Rakes, MD;  Location: [facility] ENDOSCOPY;  Service: Endoscopy;;   BIOPSY  01/03/2020   Procedure: BIOPSY;  Surgeon: Charlott Rakes, MD;  Location: [facility] ENDOSCOPY;  Service: Endoscopy;;   COLON SURGERY  03/04/2019   left colon segmental resection   COLONOSCOPY WITH PROPOFOL N/A 01/07/2019   Procedure: COLONOSCOPY WITH PROPOFOL;  Surgeon: Charlott Rakes, MD;  Location: [facility] ENDOSCOPY;  Service: Endoscopy;  Laterality: N/A;   COLONOSCOPY WITH PROPOFOL N/A 01/03/2020   Procedure: COLONOSCOPY WITH PROPOFOL;  Surgeon: Charlott Rakes, MD;  Location: [facility] ENDOS[facility];  Service: Endoscopy;   Laterality: N/A;   INCISION AND DRAINAGE OF WOUND Right 08/13/2021   Procedure: IRRIGATION AND DEBRIDEMENT WOUND RIGHT GROIN;  Surgeon: Axel Filler, MD;  Location: [facility] OR;  Service: General;  Laterality: Right;   INGUINAL HERNIA REPAIR Left 04/23/2016   Procedure: LAPAROSCOPIC  INGUINAL REPAIR;  Surgeon: Berna Bue, MD;  Location: [facility] OR;  Service: General;  Laterality: Left;   IR IMAGING GUIDED PORT INSERTION  05/08/2022   IRRIGATION AND DEBRIDEMENT ABSCESS Right 08/15/2021   Procedure: IRRIGATION AND DEBRIDEMENT OF GROIN;  Surgeon: Emelia Loron, MD;  Location: [facility] OR;  Service: General;  Laterality: Right;   IRRIGATION AND DEBRIDEMENT SEBACEOUS CYST N/A 03/19/2023   Procedure: DEBRIDEMENT OF CHRONIC INFECTION;  Surgeon: Romie Levee, MD;  Location: [facility];  Service: General;  Laterality: N/A;   KNEE SURGERY     arthroscopic left knee   LIPOMA EXCISION  2015   x20 [facility]   MITRAL VALVE REPAIR Right 05/09/2020   Procedure: MINIMALLY INVASIVE MITRAL VALVE REPAIR (MVR) USING LIVANOVA MEMO 4D MITRAL RING;  Surgeon: Purcell Nails, MD;  Location: [facility] OR;  Service: Open Heart Surgery;  Laterality: Right;   POLYPECTOMY  01/03/2020   Procedure: POLYPECTOMY;  Surgeon: Charlott Rakes, MD;  Location: [facility]OS[facility];  Service: Endoscopy;;   PORT-A-CATH REMOVAL     PORTACATH PLACEMENT N/A 03/16/2019   Procedure: INSERTION PORT-A-CATH;  Surgeon: Karie Soda, MD;  Location: [facility] ORS;  Service: General;  Laterality: N/A;   RIGHT/LEFT HEART CATH AND CORONARY ANGIOGRAPHY N/A 02/10/2020   Procedure: RIGHT/LEFT HEART CATH AND CORONARY ANGIOGRAPHY;  Surgeon: Corky Crafts, MD;  Location: [facility] INVASIVE CV LAB;  Service: Cardiovascular;  Laterality: N/A;   SUBMUCOSAL INJECTION  01/07/2019   Procedure: SUBMUCOSAL INJECTION;  Surgeon: Charlott Rakes, MD;  Location: [facility] ENDOSCOPY;  Service: Endoscopy;;   TEE WITHOUT CARDIOVERSION N/A 02/10/2020   Procedure: TRANSESOPHAGEAL  ECHOCARDIOGRAM (TEE);  Surgeon: Elease Hashimoto Deloris Ping, MD;  Location: [facility] ENDOSCOPY;  Service: Cardiovascular;  Laterality: N/A;  CATH AFTER TEE   TEE WITHOUT CARDIOVERSION N/A 05/09/2020   Procedure: TRANSESOPHAGEAL ECHOCARDIOGRAM (TEE);  Surgeon: Purcell Nails, MD;  Location: [facility] OR;  Service: Open Heart Surgery;  Laterality: N/A;   THYROIDECTOMY, PARTIAL  01/05/2009   Right Thyroid Lobectomy   TONSILLECTOMY     TOTAL NEPHRECTOMY Left 1970   Wilms Tumor left kidney excised as an infant   Social History:  reports that he has never smoked. He has never used smokeless tobacco. He reports that he does not currently use alcohol after a past usage of about 1.0 standard drink of alcohol per week. He reports that he does not use drugs.  Allergies  Allergen Reactions   Cat Dander Anaphylaxis   Empagliflozin Other (See Comments)    Recurrent UTI    Family History  Problem Relation Age of Onset   Heart disease Father    Hernia Father    Healthy Sister    Stroke Paternal Grandmother  Cancer Mother        THYROID   Hypotension Mother    Heart attack Maternal Grandfather    Congestive Heart Failure Maternal Grandfather    Heart attack Paternal Grandfather    Colon cancer Maternal Aunt 79   Congestive Heart Failure Paternal Uncle    Congestive Heart Failure Maternal Grandmother    Colon cancer Other 47       MGMs brother   Colon cancer Other        MGFs brother    Prior to Admission medications   Medication Sig Start Date End Date Taking? Authorizing Provider  acetaminophen (TYLENOL) 500 MG tablet Take 1 tablet (500 mg total) by mouth daily as needed for mild pain. 12/29/21   Redwine, Madison A, PA-C  albuterol (PROVENTIL HFA;VENTOLIN HFA) 108 (90 BASE) MCG/ACT inhaler Inhale 1-2 puffs into the lungs every 6 (six) hours as needed for wheezing or shortness of breath. Patient not taking: Reported on 05/06/2023    [provider]  aspirin EC 81 MG EC tablet Take 1 tablet (81 mg total)  by mouth daily. Swallow whole. 05/15/20   Leary Roca, PA-C  atorvastatin (LIPITOR) 10 MG tablet Take 1 tablet (10 mg total) by mouth daily. 03/15/14   Angiulli, Mcarthur Rossetti, PA-C  calcium carbonate (TUMS EX) 750 MG chewable tablet Chew 1,500 mg by mouth daily as needed for heartburn.    [provider]  cetirizine (ZYRTEC) 10 MG tablet Take 10 mg by mouth at bedtime. Patient not taking: Reported on 05/20/2023    [provider]  dabigatran (PRADAXA) 150 MG CAPS capsule Take 150 mg by mouth 2 (two) times daily.    [provider]  dexamethasone (DECADRON) 4 MG tablet Take 1 tablet (4 mg total) by mouth daily. Start on 05/15/2023 05/06/23   Ladene Artist, MD  fluticasone [facility]) 50 MCG/ACT nasal spray Place 1 spray into both nostrils daily as needed for allergies or rhinitis. Patient not taking: Reported on 04/07/2023    [provider]  HYDROcodone-acetaminophen (NORCO) 5-325 MG tablet Take 1-2 tablets by mouth every 6 (six) hours as needed for moderate pain (pain score 4-6). Do not drive while taking 04/05/08   Ladene Artist, MD  ibuprofen (ADVIL) 200 MG tablet Take 200 mg by mouth daily as needed for mild pain.    [provider]  insulin glargine, 1 Unit Dial, (TOUJEO SOLOSTAR) 300 UNIT/ML Solostar Pen Inject 28 Units into the skin daily. 04/05/21   [provider]  lidocaine-prilocaine (EMLA) cream Apply 1 Application topically as needed.    [provider]  lisinopril (ZESTRIL) 10 MG tablet Take 1 tablet (10 mg total) by mouth daily. 04/04/22   Corky Crafts, MD  loratadine (CLARITIN) 10 MG tablet Take 10 mg by mouth daily as needed for allergies.    [provider]  metFORMIN (GLUCOPHAGE) 1000 MG tablet Take 1 tablet (1,000 mg total) by mouth 2 (two) times daily. 02/12/20   Corky Crafts, MD  methocarbamol (ROBAXIN) 500 MG tablet Take 1 tablet (500 mg total) by mouth 2 (two) times daily. Patient not taking:  Reported on 05/20/2023 12/29/21   Redwine, Madison A, PA-C  metoprolol succinate (TOPROL-XL) 25 MG 24 hr tablet Take 25 mg by mouth daily.    [provider]  mometasone (ASMANEX) 220 MCG/INH inhaler Inhale 1 puff into the lungs daily.    [provider]  Omega-3 Fatty Acids (FISH OIL PO) Take 1,000 mg  by mouth 2 (two) times daily.    [provider]  ONE TOUCH ULTRA TEST test strip 1 each by Other route as needed (GLUCOSE).  05/06/15   [provider]  oxyCODONE (OXY IR/ROXICODONE) 5 MG immediate release tablet Take 1-2 tablets (5-10 mg total) by mouth every 6 (six) hours as needed for severe pain (pain score 7-10). For pain not relieved with hydrocodone 05/22/23   Rana Snare, NP  pioglitazone (ACTOS) 30 MG tablet Take 30 mg by mouth daily.    [provider]  prochlorperazine (COMPAZINE) 10 MG tablet TAKE 1 TABLET BY MOUTH EVERY 6 HOURS AS NEEDED FOR NAUSEA OR VOMITING. 03/09/23   Ladene Artist, MD  repaglinide (PRANDIN) 2 MG tablet Take 2 mg by mouth 2 (two) times daily. 05/16/20   [provider]  traMADol (ULTRAM) 50 MG tablet Take 1 tablet (50 mg total) by mouth every 6 (six) hours as needed for moderate pain (pain score 4-6). 05/06/23   Ladene Artist, MD    Physical Exam: Vitals:   05/24/23 1734 05/24/23 1945 05/24/23 2249  BP: (!) 141/96 (!) 144/90 126/75  Pulse: (!) 106 85 84  Resp: 18 18 18   Temp: 99.6 F (37.6 C)  99.1 F (37.3 C)  TempSrc: Oral  Oral  SpO2: 100% 96% 96%  Weight: 84.8 kg    Height: 5\' 11"  (1.803 m)     Constitutional: Acutely ill looking with mild distress due to pain Eyes: PERRL, lids and conjunctivae normal ENMT: Mucous membranes are moist. Posterior pharynx clear of any exudate or lesions.Normal dentition.  Neck: normal, supple, no masses, no thyromegaly Respiratory: clear to auscultation bilaterally, no wheezing, no crackles. Normal respiratory effort. No accessory muscle use.  Cardiovascular: Sinus  tachycardia, no murmurs / rubs / gallops. No extremity edema. 2+ pedal pulses. No carotid bruits.  Abdomen: no tenderness, no masses palpated. No hepatosplenomegaly. Bowel sounds positive.  Musculoskeletal: Decreased range of motion bilaterally with severe pain,, no joint swelling or tenderness, Skin: no rashes, lesions, ulcers. No induration Neurologic: CN 2-12 grossly intact. Sensation intact, DTR normal. Strength 5/5 in all 4.  Psychiatric: Normal judgment and insight. Alert and oriented x 3. Normal mood  Data Reviewed:  Temperature 99.6, heart rate 106, blood pressure 144/90, white count 11.7, hemoglobin 9.1, platelets clumped.  Sodium 132, CO2 19 BUN 25 creatinine 0.48 and calcium 7.1.  Glucose 177, magnesium 1.4 alkaline phosphatase 681 albumin 2.2.  Acute viral screen is negative for flu, RSV and COVID-19.  MRI of the lumbar spine showed diffuse osseous metastatic disease throughout the thoracic and lumbar spine and sacrum.  No acute fracture or spinal canal stenosis.  There is incomplete visualized left abdominal mass measuring about 11.6 cm.  There is worsening severe right and moderate left L4-L5 neuroforaminal stenosis due to combination of disc bulge and facet arthrosis.  Assessment and Plan:  #1 bilateral lower extremity weakness: Probably due to the pain.  Patient has metastatic disease which may have affected his lower extremity with the lumbar disc disease and lesions.  Will admit the patient for pain control, PT and OT.  #2 intractable lower back pain: Secondary to metastatic disease.  Continue IV with oral pain medications.  Dexamethasone orally.  Oncology consult.  #3 metastatic colon cancer: Patient on chemotherapy and radiation therapy.  Oncology consult  #4 diabetes type 2: Initiate sliding scale insulin.  Also long-acting insulin.  Patient's blood sugar is bound to be uncontrolled due to steroids.  #5  hyperlipidemia: Continue with statin  #6 essential hypertension:  Confirm on resume home regimen  #7 hyponatremia: Sodium was 132.  Continue monitoring.  Hydrate and monitor sodium level.  #8 increased LFTs: Most likely secondary to colon cancer with mets.  Continue to monitor LFTs  #9 moderate severe malnutrition: Nutrition consult.   Advance Care Planning:   Code Status: Full Code   Consults: Oncology, Dr [name]  Family Communication: No family at bedside  Severity of Illness: The appropriate patient status for this patient is INPATIENT. Inpatient status is judged to be reasonable and necessary in order to provide the required intensity of service to ensure the patient's safety. The patient's presenting symptoms, physical exam findings, and initial radiographic and laboratory data in the context of their chronic comorbidities is felt to place them at high risk for further clinical deterioration. Furthermore, it is not anticipated that the patient will be medically stable for discharge from the hospital within 2 midnights of admission.   * I certify that at the point of admission it is my clinical judgment that the patient will require inpatient hospital care spanning beyond 2 midnights from the point of admission due to high intensity of service, high risk for further deterioration and high frequency of surveillance required.*  AuthorLonia Blood, MD 05/24/2023 10:52 PM  For on call review [email or web address].

## 2023-05-24 NOTE — ED Triage Notes (Signed)
 Patient to ED by POV with c/o increased pain, weakness and fatigue. Pt is a caner patient who had chemo and radiation 2 weeks ago since treatment he has had increased pain in shoulders, back, spine and hips. He also c/o nausea but took a compazine 10mg  at home. Has Oxycodone, Hydrocodone and tramadol at home for pain.

## 2023-05-25 ENCOUNTER — Encounter (HOSPITAL_COMMUNITY): Payer: Self-pay | Admitting: Internal Medicine

## 2023-05-25 ENCOUNTER — Telehealth: Payer: Self-pay | Admitting: *Deleted

## 2023-05-25 ENCOUNTER — Inpatient Hospital Stay (HOSPITAL_COMMUNITY): Payer: BC Managed Care – PPO

## 2023-05-25 DIAGNOSIS — R29898 Other symptoms and signs involving the musculoskeletal system: Secondary | ICD-10-CM | POA: Diagnosis not present

## 2023-05-25 DIAGNOSIS — C186 Malignant neoplasm of descending colon: Secondary | ICD-10-CM | POA: Diagnosis not present

## 2023-05-25 LAB — COMPREHENSIVE METABOLIC PANEL
ALT: 9 U/L (ref 0–44)
AST: 21 U/L (ref 15–41)
Albumin: 2.4 g/dL — ABNORMAL LOW (ref 3.5–5.0)
Alkaline Phosphatase: 794 U/L — ABNORMAL HIGH (ref 38–126)
Anion gap: 9 (ref 5–15)
BUN: 21 mg/dL — ABNORMAL HIGH (ref 6–20)
CO2: 25 mmol/L (ref 22–32)
Calcium: 8.1 mg/dL — ABNORMAL LOW (ref 8.9–10.3)
Chloride: 98 mmol/L (ref 98–111)
Creatinine, Ser: 0.67 mg/dL (ref 0.61–1.24)
GFR, Estimated: >60 mL/min (ref >60)
Glucose, Bld: 123 mg/dL — ABNORMAL HIGH (ref 70–99)
Potassium: 4.5 mmol/L (ref 3.5–5.1)
Sodium: 132 mmol/L — ABNORMAL LOW (ref 135–145)
Total Bilirubin: 0.6 mg/dL (ref 0.0–1.2)
Total Protein: 6.1 g/dL — ABNORMAL LOW (ref 6.5–8.1)

## 2023-05-25 LAB — CBC
HCT: 29.7 % — ABNORMAL LOW (ref 39.0–52.0)
Hemoglobin: 9.4 g/dL — ABNORMAL LOW (ref 13.0–17.0)
MCH: 32.6 pg (ref 26.0–34.0)
MCHC: 31.6 g/dL (ref 30.0–36.0)
MCV: 103.1 fL — ABNORMAL HIGH (ref 80.0–100.0)
Platelets: 72 10*3/uL — ABNORMAL LOW (ref 150–400)
RBC: 2.88 MIL/uL — ABNORMAL LOW (ref 4.22–5.81)
RDW: 18.6 % — ABNORMAL HIGH (ref 11.5–15.5)
WBC: 9.6 10*3/uL (ref 4.0–10.5)
nRBC: 0.2 % (ref 0.0–0.2)

## 2023-05-25 LAB — GLUCOSE, CAPILLARY
Glucose-Capillary: 173 mg/dL — ABNORMAL HIGH (ref 70–99)
Glucose-Capillary: 226 mg/dL — ABNORMAL HIGH (ref 70–99)
Glucose-Capillary: 244 mg/dL — ABNORMAL HIGH (ref 70–99)

## 2023-05-25 LAB — CBG MONITORING, ED
Glucose-Capillary: 139 mg/dL — ABNORMAL HIGH (ref 70–99)
Glucose-Capillary: 258 mg/dL — ABNORMAL HIGH (ref 70–99)
Glucose-Capillary: 279 mg/dL — ABNORMAL HIGH (ref 70–99)

## 2023-05-25 LAB — HIV ANTIBODY (ROUTINE TESTING W REFLEX): HIV Screen 4th Generation wRfx: NONREACTIVE

## 2023-05-25 LAB — HEMOGLOBIN A1C
Hgb A1c MFr Bld: 9.5 % — ABNORMAL HIGH (ref 4.8–5.6)
Mean Plasma Glucose: 225.95 mg/dL

## 2023-05-25 LAB — CREATININE, SERUM
Creatinine, Ser: 0.57 mg/dL — ABNORMAL LOW (ref 0.61–1.24)
GFR, Estimated: >60 mL/min (ref >60)

## 2023-05-25 LAB — CK: Total CK: 188 U/L (ref 49–397)

## 2023-05-25 MED ORDER — INSULIN GLARGINE (1 UNIT DIAL) 300 UNIT/ML ~~LOC~~ SOPN: 20.0000 [IU] | PEN_INJECTOR | Freq: Every day | SUBCUTANEOUS | Status: DC

## 2023-05-25 MED ORDER — CALCIUM CARBONATE ANTACID 500 MG PO CHEW
1.0000 | CHEWABLE_TABLET | Freq: Every day | ORAL | Status: DC | PRN
  Administered 2023-05-25 – 2023-05-27 (×3): 200 mg via ORAL
  Filled 2023-05-25 (×3): qty 1

## 2023-05-25 MED ORDER — HYDROMORPHONE HCL 2 MG PO TABS
4.0000 mg | ORAL_TABLET | ORAL | Status: DC | PRN
  Administered 2023-05-25 – 2023-05-28 (×14): 8 mg via ORAL
  Filled 2023-05-25 (×14): qty 4

## 2023-05-25 MED ORDER — HYDROMORPHONE HCL 2 MG PO TABS
4.0000 mg | ORAL_TABLET | ORAL | Status: DC | PRN
Start: 2023-05-25 — End: 2023-05-25

## 2023-05-25 MED ORDER — GADOBUTROL 1 MMOL/ML IV SOLN
8.0000 mL | Freq: Once | INTRAVENOUS | Status: AC | PRN
  Administered 2023-05-25: 8 mL via INTRAVENOUS

## 2023-05-25 MED ORDER — DEXAMETHASONE 4 MG PO TABS
4.0000 mg | ORAL_TABLET | Freq: Every day | ORAL | Status: DC
Start: 2023-05-26 — End: 2023-05-28
  Administered 2023-05-26 – 2023-05-28 (×3): 4 mg via ORAL
  Filled 2023-05-25 (×3): qty 1

## 2023-05-25 MED ORDER — LISINOPRIL 5 MG PO TABS
5.0000 mg | ORAL_TABLET | Freq: Every day | ORAL | Status: DC
  Administered 2023-05-25 – 2023-05-28 (×4): 5 mg via ORAL
  Filled 2023-05-25 (×4): qty 1

## 2023-05-25 MED ORDER — INSULIN GLARGINE-YFGN 100 UNIT/ML ~~LOC~~ SOLN: 20.0000 [IU] | Freq: Every day | SUBCUTANEOUS | Status: DC

## 2023-05-25 MED ORDER — ACETAMINOPHEN 500 MG PO TABS: 500.0000 mg | ORAL_TABLET | Freq: Every day | ORAL | Status: DC | PRN

## 2023-05-25 MED ORDER — CALCIUM CARBONATE ANTACID 750 MG PO CHEW: 1500.0000 mg | CHEWABLE_TABLET | Freq: Every day | ORAL | Status: DC | PRN

## 2023-05-25 MED ORDER — INSULIN GLARGINE-YFGN 100 UNIT/ML ~~LOC~~ SOLN
20.0000 [IU] | Freq: Every day | SUBCUTANEOUS | Status: DC
  Administered 2023-05-25 – 2023-05-27 (×3): 20 [IU] via SUBCUTANEOUS
  Filled 2023-05-25 (×4): qty 0.2

## 2023-05-25 MED ORDER — LORAZEPAM 0.5 MG PO TABS
0.5000 mg | ORAL_TABLET | Freq: Once | ORAL | Status: AC | PRN
  Administered 2023-05-25: 0.5 mg via ORAL
  Filled 2023-05-25: qty 1

## 2023-05-25 MED ORDER — ALBUTEROL SULFATE (2.5 MG/3ML) 0.083% IN NEBU
2.5000 mg | INHALATION_SOLUTION | Freq: Four times a day (QID) | RESPIRATORY_TRACT | Status: DC | PRN
  Administered 2023-05-27: 2.5 mg via RESPIRATORY_TRACT
  Filled 2023-05-25: qty 3

## 2023-05-25 MED ORDER — DABIGATRAN ETEXILATE MESYLATE 75 MG PO CAPS
150.0000 mg | ORAL_CAPSULE | Freq: Two times a day (BID) | ORAL | Status: DC
  Administered 2023-05-25 – 2023-05-26 (×2): 150 mg via ORAL
  Filled 2023-05-25 (×3): qty 2
  Filled 2023-05-25: qty 1
  Filled 2023-05-25: qty 2

## 2023-05-25 MED ORDER — LISINOPRIL 10 MG PO TABS: 10.0000 mg | ORAL_TABLET | Freq: Every day | ORAL | Status: DC

## 2023-05-25 MED ORDER — DEXAMETHASONE 4 MG PO TABS: 4.0000 mg | ORAL_TABLET | Freq: Three times a day (TID) | ORAL | Status: DC

## 2023-05-25 MED ORDER — FENTANYL CITRATE PF 50 MCG/ML IJ SOSY: 50.0000 ug | PREFILLED_SYRINGE | Freq: Once | INTRAMUSCULAR | Status: DC

## 2023-05-25 MED ORDER — METOPROLOL SUCCINATE ER 25 MG PO TB24
25.0000 mg | ORAL_TABLET | Freq: Every day | ORAL | Status: DC
  Administered 2023-05-25 – 2023-05-28 (×4): 25 mg via ORAL
  Filled 2023-05-25 (×4): qty 1

## 2023-05-25 MED ORDER — ALBUTEROL SULFATE HFA 108 (90 BASE) MCG/ACT IN AERS: 2.0000 | INHALATION_SPRAY | Freq: Four times a day (QID) | RESPIRATORY_TRACT | Status: DC | PRN

## 2023-05-25 NOTE — Progress Notes (Addendum)
 PROGRESS NOTE  [name]  WUJ:[id number] DOB: 11/24/1965 DOA: 05/24/2023 PCP: Joycelyn Rua, MD  Consultants  Brief Narrative: 58 y.o. 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  Patient has been seen oncologist on chemotherapy with the last infusion on February 21 2 days ago.  Patient is followed by Dr. [name] coming today to the ER with inability to walk and severe low back pain.  Patient also has increased fatigue.  He did have radiation 2 weeks ago also and since then he has been having increased pain in the shoulders back spine and the hips.  Patient has been on Compazine for nausea which he took at home.  He is on multiple pain medications including oxycodone hydrocodone and tramadol but none has relieve his pain.  In the ER attempt to walk the patient failed.  Further imaging studies showed metastatic disease to the lumbar spine but has an L4-L5 severe stenosis with disc bulge.  Patient has been admitted for further evaluation and treatment.  No evidence of cauda equina syndrome.    Assessment & Plan: bilateral lower extremity weakness:  - Working diagnosis is secondary to pain.  MRI with lumbar disc dz and lesions  - concern of course for brain mets in light of sudden onset over past few days.   - MRI brain w/wo contrast ordered.  Cannot be obtained until tomorrow (24 hours after today's contrast) - Appreciate Dr. Kalman Drape expertise and knowledge of this patient - Neurology also consulted due to rapidity of his symptoms.    intractable lower back pain:  Secondary to metastatic disease.  Continue IV with oral pain medications.   - APAP plus oral and IV diluadid - Dexamethasone as per oncology    metastatic colon cancer:  Patient on chemotherapy and radiation therapy.  Oncology consult    diabetes type 2:  Initiate sliding scale insulin.  Also long-acting insulin.   - long-acting held on admit, CBGs uptrending - restarting lantus at half home dose, low threshold to increase to full home dose pending CBGs here.   - Carb mod diet   hyperlipidemia:  Continue with statin   essential hypertension:  - home regimen held until med rec done - BPs have been creeping upwards - restarting home regimen  Hx/o PE: - for which he takes dabigatran - restarting home AC   hyponatremia:  Sodium was 132.  Continue monitoring.  Hydrate and monitor sodium level. In range since August 2024 not cause of sudden LE weakness   increased LFTs:  - elevated alk phos most liikely secondary to colon cancer with mets.  Continue to monitor LFTs   moderate severe malnutrition:  Nutrition consult.    Macrocytic anemia: - longstanding as well, although Hgb 9.4 a little lower than has been - no history/evidence of bleed - Onc on board.  Will follow     DVT prophylaxis:   dabigatran (PRADAXA) capsule 150 mg  Code Status:   Code Status: Full Code Level of care: Med-Surg Status is: Inpatient Dispo: pending workup for weakness   Consults called: Oncology, neurology   Subjective: Patient feels "ok" on my exam.  Reports he can rally and talk with staff, then energy gives out afterwards.  Painful when moving legs even when not standing.   Objective: Vitals:   05/25/23  0730 05/25/23 0945 05/25/23 1230 05/25/23 1500  BP:  (!) 148/83 (!) 152/80   Pulse:  93 92   Resp:  18 18   Temp: 97.9 F (36.6 C)   97.8 F (36.6 C)  TempSrc:      SpO2:  95% 95%   Weight:      Height:        Intake/Output Summary (Last 24 hours) at 05/25/2023 1619 Last data filed at 05/25/2023 1601 Gross per 24 hour  Intake 575.56 ml  Output 800 ml  Net -224.44 ml   Filed Weights   05/24/23 1734  Weight: 84.8 kg   Body mass index is 26.08 kg/m.  Gen: 58 y.o. male in no apparent distress.  IN pain with  movement  Pulm: Non-labored breathing.  Clear to auscultation bilaterally.  CV: Regular rate and rhythm. No murmur, rub, or gallop. No JVD GI: Abdomen soft, non-tender, non-distended Ext: Warm, no deformities,  Skin: No rashes, lesions  Neuro: Alert and oriented. Strength 3/5 BL LE's appears to be limited by pain though difficult to discern pain vs weakness.  Sensation intact BL  Psych: Calm  Judgement and insight appear normal. Mood & affect appropriate.     I have personally reviewed the following labs and images: CBC: Recent Labs  Lab 05/20/23 0855 05/22/23 0828 05/24/23 1943 05/25/23 0541  WBC 9.6 11.3* 11.7* 9.6  NEUTROABS 6.8 8.4* 8.8*  --   HGB 10.6* 10.1* 9.1* 9.4*  HCT 32.3* 30.0* 28.9* 29.7*  MCV 99.1 98.7 103.2* 103.1*  PLT 51* 53* PLATELET CLUMPS NOTED ON SMEAR, UNABLE TO ESTIMATE 72*   BMP &GFR Recent Labs  Lab 05/20/23 0855 05/24/23 1912 05/25/23 0115 05/25/23 0541  NA 132* 132*  --  132*  K 3.9 4.0  --  4.5  CL 97* 104  --  98  CO2 24 19*  --  25  GLUCOSE 305* 177*  --  123*  BUN 22* 25*  --  21*  CREATININE 0.88 0.48* 0.57* 0.67  CALCIUM 8.2* 7.1*  --  8.1*  MG  --  1.4*  --   --    Estimated Creatinine Clearance: 111.1 mL/min (by C-G formula based on SCr of 0.67 mg/dL). Liver & Pancreas: Recent Labs  Lab 05/20/23 0855 05/24/23 1912 05/25/23 0541  AST 18 17 21   ALT 7 9 9   ALKPHOS 1,132* 681* 794*  BILITOT 0.6 0.5 0.6  PROT 6.4* 5.0* 6.1*  ALBUMIN 3.4* 2.2* 2.4*   No results for input(s): "LIPASE", "AMYLASE" in the last 168 hours. No results for input(s): "AMMONIA" in the last 168 hours. Diabetic: Recent Labs    05/25/23 0540  HGBA1C 9.5*   Recent Labs  Lab 05/24/23 2355 05/25/23 0804 05/25/23 1330 05/25/23 1513  GLUCAP 155* 139* 258* 279*   Cardiac Enzymes: No results for input(s): "CKTOTAL", "CKMB", "CKMBINDEX", "TROPONINI" in the last 168 hours. No results for input(s): "PROBNP" in the last 8760 hours. Coagulation  Profile: No results for input(s): "INR", "PROTIME" in the last 168 hours. Thyroid Function Tests: No results for input(s): "TSH", "T4TOTAL", "FREET4", "T3FREE", "THYROIDAB" in the last 72 hours. Lipid Profile: No results for input(s): "CHOL", "HDL", "LDLCALC", "TRIG", "CHOLHDL", "LDLDIRECT" in the last 72 hours. Anemia Panel: No results for input(s): "VITAMINB12", "FOLATE", "FERRITIN", "TIBC", "IRON", "RETICCTPCT" in the last 72 hours. Urine analysis:    Component Value Date/Time   COLORURINE YELLOW 07/22/2022 0822   APPEARANCEUR CLEAR 07/22/2022 0822   LABSPEC 1.026 07/22/2022 1610  PHURINE 6.0 07/22/2022 0822   GLUCOSEU NEGATIVE 07/22/2022 0822   HGBUR NEGATIVE 07/22/2022 0822   BILIRUBINUR NEGATIVE 07/22/2022 0822   KETONESUR NEGATIVE 07/22/2022 0822   PROTEINUR 100 (A) 05/20/2023 0855   UROBILINOGEN 0.2 08/17/2014 1831   NITRITE NEGATIVE 07/22/2022 0822   LEUKOCYTESUR NEGATIVE 07/22/2022 0822   Sepsis Labs: Invalid input(s): "PROCALCITONIN", "LACTICIDVEN"  Microbiology: Recent Results (from the past 240 hours)  Resp panel by RT-PCR (RSV, Flu A&B, Covid) Anterior Nasal Swab     Status: None   Collection Time: 05/24/23  5:44 PM   Specimen: Anterior Nasal Swab  Result Value Ref Range Status   SARS Coronavirus 2 by RT PCR NEGATIVE NEGATIVE Final    Comment: (NOTE) SARS-CoV-2 target nucleic acids are NOT DETECTED.  The SARS-CoV-2 RNA is generally detectable in upper respiratory specimens during the acute phase of infection. The lowest concentration of SARS-CoV-2 viral copies this assay can detect is 138 copies/mL. A negative result does not preclude SARS-Cov-2 infection and should not be used as the sole basis for treatment or other patient management decisions. A negative result may occur with  improper specimen collection/handling, submission of specimen other than nasopharyngeal swab, presence of viral mutation(s) within the areas targeted by this assay, and inadequate  number of viral copies(<138 copies/mL). A negative result must be combined with clinical observations, patient history, and epidemiological information. The expected result is Negative.  Fact Sheet for Patients:  BloggerCourse.com  Fact Sheet for Healthcare Providers:  SeriousBroker.it  This test is no t yet approved or cleared by the [place] FDA and  has been authorized for detection and/or diagnosis of SARS-CoV-2 by FDA under an Emergency Use Authorization (EUA). This EUA will remain  in effect (meaning this test can be used) for the duration of the COVID-19 declaration under Section 564(b)(1) of the Act, 21 U.S.C.section 360bbb-3(b)(1), unless the authorization is terminated  or revoked sooner.       Influenza A by PCR NEGATIVE NEGATIVE Final   Influenza B by PCR NEGATIVE NEGATIVE Final    Comment: (NOTE) The Xpert Xpress SARS-CoV-2/FLU/RSV plus assay is intended as an aid in the diagnosis of influenza from Nasopharyngeal swab specimens and should not be used as a sole basis for treatment. Nasal washings and aspirates are unacceptable for Xpert Xpress SARS-CoV-2/FLU/RSV testing.  Fact Sheet for Patients: BloggerCourse.com  Fact Sheet for Healthcare Providers: SeriousBroker.it  This test is not yet approved or cleared by the [place] FDA and has been authorized for detection and/or diagnosis of SARS-CoV-2 by FDA under an Emergency Use Authorization (EUA). This EUA will remain in effect (meaning this test can be used) for the duration of the COVID-19 declaration under Section 564(b)(1) of the Act, 21 U.S.C. section 360bbb-3(b)(1), unless the authorization is terminated or revoked.     Resp Syncytial Virus by PCR NEGATIVE NEGATIVE Final    Comment: (NOTE) Fact Sheet for Patients: BloggerCourse.com  Fact Sheet for Healthcare  Providers: SeriousBroker.it  This test is not yet approved or cleared by the [place] FDA and has been authorized for detection and/or diagnosis of SARS-CoV-2 by FDA under an Emergency Use Authorization (EUA). This EUA will remain in effect (meaning this test can be used) for the duration of the COVID-19 declaration under Section 564(b)(1) of the Act, 21 U.S.C. section 360bbb-3(b)(1), unless the authorization is terminated or revoked.  Performed at [facility], [address], [place], [place] [address]     Radiology Studies: MR Lumbar Spine W  Wo Contrast Result Date: 05/24/2023 CLINICAL DATA:  Metastatic disease evaluation EXAM: MRI THORACIC AND LUMBAR SPINE WITHOUT AND WITH CONTRAST TECHNIQUE: Multiplanar and multiecho pulse sequences of the thoracic and lumbar spine were obtained without and with intravenous contrast. CONTRAST:  8mL GADAVIST GADOBUTROL 1 MMOL/ML IV SOLN COMPARISON:  None Available. FINDINGS: MRI THORACIC SPINE FINDINGS Alignment:  Levoscoliosis.  No static subluxation. Vertebrae: There is diffuse osseous metastatic disease affecting all of the thoracic levels. There is no acute compression fracture. Cord:  Normal signal and morphology. Paraspinal and other soft tissues: Limited visualization due to motion. Disc levels: There is no spinal canal stenosis. No abnormal epidural soft tissue. MRI LUMBAR SPINE FINDINGS Segmentation:  Standard. Alignment: S shaped scoliosis with right apex at L1 and left apex at L5. Vertebrae: Diffuse osseous metastatic disease throughout the lumbar spine and sacrum. There is mild multilevel height loss but no acute fracture. Conus medullaris: Extends to the L1 level and appears normal. Paraspinal and other soft tissues: Incompletely visualized left abdominal mass measuring at least 11.6 cm. Disc levels: L1-L2: Normal disc space and facet joints. No spinal canal stenosis. No neural foraminal stenosis.  L2-L3: Small disc bulge. No spinal canal stenosis. No neural foraminal stenosis. L3-L4: Small disc bulge. No spinal canal stenosis. Mild bilateral neural foraminal stenosis. L4-L5: Small right asymmetric disc bulge and mild facet arthrosis. No spinal canal stenosis. Worsened severe right and moderate left neural foraminal stenosis. L5-S1: Normal disc space and facet joints. No spinal canal stenosis. No neural foraminal stenosis. Visualized sacrum: Normal. IMPRESSION: 1. Diffuse osseous metastatic disease throughout the thoracic and lumbar spine and sacrum. No acute fracture or spinal canal stenosis. 2. Incompletely visualized left abdominal mass measuring at least 11.6 cm. 3. Worsened severe right and moderate left L4-L5 neural foraminal stenosis due to combination of disc bulge and facet arthrosis. Electronically Signed   By: Deatra Robinson M.D.   On: 05/24/2023 21:51   MR THORACIC SPINE W WO CONTRAST Result Date: 05/24/2023 CLINICAL DATA:  Metastatic disease evaluation EXAM: MRI THORACIC AND LUMBAR SPINE WITHOUT AND WITH CONTRAST TECHNIQUE: Multiplanar and multiecho pulse sequences of the thoracic and lumbar spine were obtained without and with intravenous contrast. CONTRAST:  8mL GADAVIST GADOBUTROL 1 MMOL/ML IV SOLN COMPARISON:  None Available. FINDINGS: MRI THORACIC SPINE FINDINGS Alignment:  Levoscoliosis.  No static subluxation. Vertebrae: There is diffuse osseous metastatic disease affecting all of the thoracic levels. There is no acute compression fracture. Cord:  Normal signal and morphology. Paraspinal and other soft tissues: Limited visualization due to motion. Disc levels: There is no spinal canal stenosis. No abnormal epidural soft tissue. MRI LUMBAR SPINE FINDINGS Segmentation:  Standard. Alignment: S shaped scoliosis with right apex at L1 and left apex at L5. Vertebrae: Diffuse osseous metastatic disease throughout the lumbar spine and sacrum. There is mild multilevel height loss but no acute  fracture. Conus medullaris: Extends to the L1 level and appears normal. Paraspinal and other soft tissues: Incompletely visualized left abdominal mass measuring at least 11.6 cm. Disc levels: L1-L2: Normal disc space and facet joints. No spinal canal stenosis. No neural foraminal stenosis. L2-L3: Small disc bulge. No spinal canal stenosis. No neural foraminal stenosis. L3-L4: Small disc bulge. No spinal canal stenosis. Mild bilateral neural foraminal stenosis. L4-L5: Small right asymmetric disc bulge and mild facet arthrosis. No spinal canal stenosis. Worsened severe right and moderate left neural foraminal stenosis. L5-S1: Normal disc space and facet joints. No spinal canal stenosis. No neural foraminal stenosis. Visualized sacrum: Normal.  IMPRESSION: 1. Diffuse osseous metastatic disease throughout the thoracic and lumbar spine and sacrum. No acute fracture or spinal canal stenosis. 2. Incompletely visualized left abdominal mass measuring at least 11.6 cm. 3. Worsened severe right and moderate left L4-L5 neural foraminal stenosis due to combination of disc bulge and facet arthrosis. Electronically Signed   By: Deatra Robinson M.D.   On: 05/24/2023 21:51    Scheduled Meds:  dabigatran  150 mg Oral Q12H   dexamethasone  4 mg Oral Q8H   insulin aspart  0-20 Units Subcutaneous TID WC   insulin aspart  0-5 Units Subcutaneous QHS   insulin glargine (1 Unit Dial)  20 Units Subcutaneous Daily   lisinopril  10 mg Oral Daily   lisinopril  5 mg Oral Daily   metoprolol succinate  25 mg Oral Daily   Continuous Infusions:  lactated ringers 40 mL/hr at 05/25/23 1601     LOS: 1 day   55 minutes with more than 50% spent in reviewing records, counseling patient/family and coordinating care.  Tobey Grim, MD [facility] [email or web address] 05/25/2023, 4:19 PM

## 2023-05-25 NOTE — Progress Notes (Signed)
 IP PROGRESS NOTE  Subjective:   [name] presented to the emergency room yesterday with increased lower back pain.  He reports leg weakness and difficulty ambulating for the past few days.  No difficulty with bowel or bladder control.  He reports diffuse bone pain including pain in the arms, neck, back, and legs.  Objective: Vital signs in last 24 hours: Blood pressure (!) 140/86, pulse 92, temperature 97.9 F (36.6 C), resp. rate 18, height 5\' 11"  (1.803 m), weight 187 lb (84.8 kg), SpO2 95%.  Intake/Output from previous day: 02/23 0701 - 02/24 0700 In: -  Out: 800 [Urine:800]  Physical Exam:  HEENT: No thrush or ulcers Lungs: Clear bilaterally Cardiac: Regular rate and rhythm Abdomen: Nontender, no hepatosplenomegaly Extremities: No leg edema Neurologic: Alert and oriented, the motor exam appears intact in the arms/hands bilaterally.  Decreased strength at the left greater than right leg with hip flexion/knee extension, 3-4/5 strength with flexion at the left hip  Portacath/PICC-without erythema  Lab Results: Recent Labs    05/24/23 1943 05/25/23 0541  WBC 11.7* 9.6  HGB 9.1* 9.4*  HCT 28.9* 29.7*  PLT PLATELET CLUMPS NOTED ON SMEAR, UNABLE TO ESTIMATE 72*    BMET Recent Labs    05/24/23 1912 05/25/23 0115 05/25/23 0541  NA 132*  --  132*  K 4.0  --  4.5  CL 104  --  98  CO2 19*  --  25  GLUCOSE 177*  --  123*  BUN 25*  --  21*  CREATININE 0.48* 0.57* 0.67  CALCIUM 7.1*  --  8.1*    Lab Results  Component Value Date   CEA1 2.00 07/13/2020   CEA 38.61 (H) 05/20/2023    Studies/Results: MR Lumbar Spine W Wo Contrast Result Date: 05/24/2023 CLINICAL DATA:  Metastatic disease evaluation EXAM: MRI THORACIC AND LUMBAR SPINE WITHOUT AND WITH CONTRAST TECHNIQUE: Multiplanar and multiecho pulse sequences of the thoracic and lumbar spine were obtained without and with intravenous contrast. CONTRAST:  8mL GADAVIST GADOBUTROL 1 MMOL/ML IV SOLN COMPARISON:  None  Available. FINDINGS: MRI THORACIC SPINE FINDINGS Alignment:  Levoscoliosis.  No static subluxation. Vertebrae: There is diffuse osseous metastatic disease affecting all of the thoracic levels. There is no acute compression fracture. Cord:  Normal signal and morphology. Paraspinal and other soft tissues: Limited visualization due to motion. Disc levels: There is no spinal canal stenosis. No abnormal epidural soft tissue. MRI LUMBAR SPINE FINDINGS Segmentation:  Standard. Alignment: S shaped scoliosis with right apex at L1 and left apex at L5. Vertebrae: Diffuse osseous metastatic disease throughout the lumbar spine and sacrum. There is mild multilevel height loss but no acute fracture. Conus medullaris: Extends to the L1 level and appears normal. Paraspinal and other soft tissues: Incompletely visualized left abdominal mass measuring at least 11.6 cm. Disc levels: L1-L2: Normal disc space and facet joints. No spinal canal stenosis. No neural foraminal stenosis. L2-L3: Small disc bulge. No spinal canal stenosis. No neural foraminal stenosis. L3-L4: Small disc bulge. No spinal canal stenosis. Mild bilateral neural foraminal stenosis. L4-L5: Small right asymmetric disc bulge and mild facet arthrosis. No spinal canal stenosis. Worsened severe right and moderate left neural foraminal stenosis. L5-S1: Normal disc space and facet joints. No spinal canal stenosis. No neural foraminal stenosis. Visualized sacrum: Normal. IMPRESSION: 1. Diffuse osseous metastatic disease throughout the thoracic and lumbar spine and sacrum. No acute fracture or spinal canal stenosis. 2. Incompletely visualized left abdominal mass measuring at least 11.6 cm. 3. Worsened severe  right and moderate left L4-L5 neural foraminal stenosis due to combination of disc bulge and facet arthrosis. Electronically Signed   By: Deatra Robinson M.D.   On: 05/24/2023 21:51   MR THORACIC SPINE W WO CONTRAST Result Date: 05/24/2023 CLINICAL DATA:  Metastatic  disease evaluation EXAM: MRI THORACIC AND LUMBAR SPINE WITHOUT AND WITH CONTRAST TECHNIQUE: Multiplanar and multiecho pulse sequences of the thoracic and lumbar spine were obtained without and with intravenous contrast. CONTRAST:  8mL GADAVIST GADOBUTROL 1 MMOL/ML IV SOLN COMPARISON:  None Available. FINDINGS: MRI THORACIC SPINE FINDINGS Alignment:  Levoscoliosis.  No static subluxation. Vertebrae: There is diffuse osseous metastatic disease affecting all of the thoracic levels. There is no acute compression fracture. Cord:  Normal signal and morphology. Paraspinal and other soft tissues: Limited visualization due to motion. Disc levels: There is no spinal canal stenosis. No abnormal epidural soft tissue. MRI LUMBAR SPINE FINDINGS Segmentation:  Standard. Alignment: S shaped scoliosis with right apex at L1 and left apex at L5. Vertebrae: Diffuse osseous metastatic disease throughout the lumbar spine and sacrum. There is mild multilevel height loss but no acute fracture. Conus medullaris: Extends to the L1 level and appears normal. Paraspinal and other soft tissues: Incompletely visualized left abdominal mass measuring at least 11.6 cm. Disc levels: L1-L2: Normal disc space and facet joints. No spinal canal stenosis. No neural foraminal stenosis. L2-L3: Small disc bulge. No spinal canal stenosis. No neural foraminal stenosis. L3-L4: Small disc bulge. No spinal canal stenosis. Mild bilateral neural foraminal stenosis. L4-L5: Small right asymmetric disc bulge and mild facet arthrosis. No spinal canal stenosis. Worsened severe right and moderate left neural foraminal stenosis. L5-S1: Normal disc space and facet joints. No spinal canal stenosis. No neural foraminal stenosis. Visualized sacrum: Normal. IMPRESSION: 1. Diffuse osseous metastatic disease throughout the thoracic and lumbar spine and sacrum. No acute fracture or spinal canal stenosis. 2. Incompletely visualized left abdominal mass measuring at least 11.6 cm.  3. Worsened severe right and moderate left L4-L5 neural foraminal stenosis due to combination of disc bulge and facet arthrosis. Electronically Signed   By: Deatra Robinson M.D.   On: 05/24/2023 21:51    Medications: I have reviewed the patient's current medications.  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, status post a left colectomy 03/04/2019 Mass felt to be in the transverse colon on a colonoscopy 01/07/2019 with a biopsy confirming poorly differentiated adenocarcinoma with signet ring cell features, mismatch repair protein expression normal CT abdomen/pelvis 01/08/2019 12.3 cm indeterminate splenic mass, present in 2015 on a lumbar MRI-felt to be benign, small subpleural and pleural-based nodules at the lung bases, status post left nephrectomy CT chest 01/20/2019-small bilateral pulmonary nodules with rounded parenchymal nodules in the right lower lobe, indeterminate splenic mass CT chest 03/18/2019-multiple subcentimeter pulmonary nodules again seen bilaterally, greatest in the lower lobes, without significant change.  No new or enlarging pulmonary nodules or masses.  Partially visualized large heterogeneous splenic mass with no significant change.  Plan for follow-up chest CT at a 73-month interval. Cycle 1 FOLFOX 03/21/2019 Cycle 2 FOLFOX 04/04/2019 Cycle 3 FOLFOX 04/18/2019 (oxaliplatin dose reduced secondary to thrombocytopenia) Cycle 4 FOLFOX 05/02/2019 (oxaliplatin held secondary to thrombocytopenia) Cycle 5 FOLFOX 05/16/2019 Cycle 6 FOLFOX 05/30/2019 (oxaliplatin held secondary to thrombocytopenia) CT chest 06/08/2019-stable scattered small solid pulmonary nodules  Cycle 7 FOLFOX 06/13/2019 Cycle 8 FOLFOX 06/27/2019 Cycle 9 FOLFOX 07/11/2019 Cycle 10 FOLFOX 07/25/2019 (oxaliplatin held due to thrombocytopenia) Cycle  11 FOLFOX 08/08/2019 Cycle 12 FOLFOX 08/22/2019 CTs 01/09/2020-no  evidence of recurrent disease, stable lung nodules favored to represent subpleural lymph nodes-dedicated follow-up not recommended CTs 01/11/2021-no evidence of recurrent disease CTs 01/24/2022-no evidence of recurrent disease CT lumbar spine, abdomen/pelvis 04/11/2022-numerous mixed lytic and sclerotic lesions scattered throughout the visualized axial and appendicular skeleton including several areas in the lumbar spine.   04/21/2022 CT biopsy sclerotic lesion right anterior iliac bone-consistent with metastatic colorectal adenocarcinoma, moderate to poorly differentiated.  Foundation 1-microsatellite stable, tumor mutation burden 4, K-ras G12S PET scan 05/09/2022-widespread hypermetabolic mixed faintly lytic and sclerotic osseous metastases throughout the axial and proximal appendicular skeleton; associated mild pathologic compression fracture at L3; no hypermetabolic extraosseous metastatic disease. Cycle 1 FOLFIRI/bevacizumab 05/26/2022 Cycle 2 FOLFIRI/bevacizumab 06/09/2022 Cycle 3 FOLFIRI 06/24/2022, bevacizumab 06/26/2022 Cycle 4 FOLFIRI 07/08/2022, bevacizumab held due to proteinuria Cycle 5 FOLFIRI 07/22/2022, bevacizumab held due to proteinuria CTs 07/31/2022-potentially areas of sclerosis represent interval healing; new lytic lesion at T12, multiple new areas of punched-out sclerosis throughout the visualized osseous structures.  Index lesion in the left iliac wing measures 1.3 x 2.8 cm.  Healing of previously metabolically occult metastatic disease on 05/09/2022 is a possibility. Cycle 6 FOLFIRI 08/04/2022, irinotecan dose reduced due to thrombocytopenia, bevacizumab held pending 24-hour urine result Cycle 7 FOLFIRI 08/18/2022, bevacizumab held secondary to proteinuria, G-CSF held Cycle 8 FOLFIRI 09/01/2022, bevacizumab held, G-CSF Cycle 9 FOLFIRI 09/16/2022, bevacizumab held, G-CSF CTs 09/29/2022-enlargement of a hypodense lesion posterior left lobe of the liver, similar diffuse mixed lytic and sclerotic  osseous metastatic disease, multiple new small nodules medial right lung base, multiple other small bilateral pulmonary nodules unchanged. Cycle 10 FOLFIRI plus Avastin 10/08/2022 Cycle 11 FOLFIRI 10/27/2022, Avastin held Cycle 12 FOLFIRI/Avastin 11/11/2022 Cycle 13 FOLFIRI/Avastin 11/25/2022 CTs 12/08/2022-stable lesion posterior left lobe of the liver.  Unchanged diffuse lytic and sclerotic osseous metastatic disease.  Stable small solid pulmonary nodules.  Stable splenic lesions.  Slightly increased bladder wall thickening. Cycle 14 FOLFIRI/Avastin 12/10/2022 Cycle 15 FOLFIRI/Avastin 12/24/2022 12/31/2022 24-hour urine protein 710 mg Cycle 16 FOLFIRI/Avastin 01/07/2023 Cycle 17 FOLFIRI 01/21/2023, Avastin held secondary to persistent proteinuria, slight increase in creatinine, and drainage from the suprapubic lesion CTs 01/26/2023-unchanged widespread sclerotic osseous metastatic disease, unchanged hypodense liver lesions, occasional small bilateral lung nodules unchanged. Cycle 18 FOLFIRI 02/03/2023, Avastin held Cycle 19 FOLFIRI 02/17/2023, Avastin held Cycle 20 FOLFIRI 03/03/2023, Avastin held Cycle 21 FOLFIRI 03/16/2023, Avastin held CTs 04/04/2023: Right increased sclerosis of osseous metastatic disease with new sclerotic lesions of the sternum/manubrium and increase sclerotic metastases throughout the pelvis, unchanged hypodense liver lesions Cycle 22 FOLFIRI 04/07/2023, Avastin held Cycle 23 FOLFIRI/Avastin 04/22/2023 Cycle 24 FOLFIRI/Avastin 05/06/2023   Multiple colon polyps-ascending colon polyps on the colonoscopy 01/07/2019-not removed Colonoscopy 01/03/2020-multiple polyps removed, tubular adenomas, inflammatory and hyperplastic polyps Wilms tumor at age 55, status post chemotherapy/radiation and a nephrectomy in [place] Hurthle cell adenomas, 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  Left nephrectomy at age 47 12.  Port-A-Cath placement, Dr. Michaell Cowing, 03/16/2019 13.  Thrombocytopenia secondary to chemotherapy-oxaliplatin dose reduced beginning with cycle 3 FOLFOX, improved 14.  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  Right groin soft tissue infection May 2023-status postsurgical debridement 18.  Left leg DVT-common  femoral, femoral, popliteal, posterior tibial, and peroneal veins 19.  02/04/2022-MRI cervical spine-1.7 cm T1 lesion-indeterminate; bone scan 03/14/2022-focal intense activity at approximate T1 level felt to correspond to the lesion on MRI, additional foci of activity involving the lower thoracic and lumbar spine, right iliac bone and pubic symphysis.  CT lumbar spine, abdomen/pelvis 04/11/2022-numerous mixed lytic and sclerotic lesions scattered throughout the visualized axial and appendicular skeleton including several areas in the lumbar spine.  04/21/2022 CT biopsy sclerotic lesion right anterior iliac bone-consistent with metastatic colorectal adenocarcinoma, moderate to poorly differentiated 20.  Inflamed sebaceous cyst 08/18/2022-doxycycline Surgical resection of area of "hidradenitis " 03/19/2023 21.  Admission 05/24/2023 with increased back pain and new onset leg weakness MRI thoracic and lumbar spine 05/24/2023: Fuhs osseous metastatic disease, no acute compression fracture, normal cord signal but no spinal canal stenosis, no abnormal epidural soft tissue     [name] has metastatic colon cancer with extensive bone metastases.  He is admitted with increased pain and new onset leg weakness.  An MRI of the thoracic and lumbar spine does not reveal evidence of a cord compression syndrome.  I will increase the Decadron to see if  this will help with the pain and potentially leg weakness.  Treatment for colon cancer is currently on hold.  He has thrombocytopenia secondary to chemotherapy and metastatic tumor involving the bone marrow.  The platelet count is improved today and platelet clumps were noted on the blood smear yesterday.  [name] is scheduled for a consultation at [facility] next week.  He will not be able to make the trip to [place] unless his clinical status improves.  Recommendations: Trial of Decadron for pain and leg weakness PT/OT evaluation Narcotic analgesics as needed for pain, I will add oral Dilaudid Oncology will follow him in the hospital and outpatient follow-up will be scheduled at the Cancer center   LOS: 1 day   Thornton Papas, MD   05/25/2023, 9:56 AM

## 2023-05-25 NOTE — Plan of Care (Signed)
 Patient is alert and oriented X4, able to walk from stretcher to the bed without assistance. Patient is accompanied by wife at the bedside. MD Gwendolyn Grant notified of patient's request for regular diet, skin assessment completed, diet changed to regular and will continue to monitor.   Problem: Education: Goal: Ability to describe self-care measures that may prevent or decrease complications (Diabetes Survival Skills Education) will improve Outcome: Progressing Goal: Individualized Educational Video(s) Outcome: Progressing   Problem: Coping: Goal: Ability to adjust to condition or change in health will improve Outcome: Progressing   Problem: Fluid Volume: Goal: Ability to maintain a balanced intake and output will improve Outcome: Progressing   Problem: Health Behavior/Discharge Planning: Goal: Ability to identify and utilize available resources and services will improve Outcome: Progressing Goal: Ability to manage health-related needs will improve Outcome: Progressing   Problem: Metabolic: Goal: Ability to maintain appropriate glucose levels will improve Outcome: Progressing   Problem: Nutritional: Goal: Maintenance of adequate nutrition will improve Outcome: Progressing Goal: Progress toward achieving an optimal weight will improve Outcome: Progressing   Problem: Skin Integrity: Goal: Risk for impaired skin integrity will decrease Outcome: Progressing   Problem: Tissue Perfusion: Goal: Adequacy of tissue perfusion will improve Outcome: Progressing   Problem: Education: Goal: Knowledge of General Education information will improve Description: Including pain rating scale, medication(s)/side effects and non-pharmacologic comfort measures Outcome: Progressing   Problem: Health Behavior/Discharge Planning: Goal: Ability to manage health-related needs will improve Outcome: Progressing   Problem: Clinical Measurements: Goal: Ability to maintain clinical measurements within  normal limits will improve Outcome: Progressing Goal: Will remain free from infection Outcome: Progressing Goal: Diagnostic test results will improve Outcome: Progressing Goal: Respiratory complications will improve Outcome: Progressing Goal: Cardiovascular complication will be avoided Outcome: Progressing   Problem: Activity: Goal: Risk for activity intolerance will decrease Outcome: Progressing   Problem: Nutrition: Goal: Adequate nutrition will be maintained Outcome: Progressing   Problem: Coping: Goal: Level of anxiety will decrease Outcome: Progressing   Problem: Elimination: Goal: Will not experience complications related to bowel motility Outcome: Progressing Goal: Will not experience complications related to urinary retention Outcome: Progressing   Problem: Pain Managment: Goal: General experience of comfort will improve and/or be controlled Outcome: Progressing   Problem: Safety: Goal: Ability to remain free from injury will improve Outcome: Progressing   Problem: Skin Integrity: Goal: Risk for impaired skin integrity will decrease Outcome: Progressing

## 2023-05-25 NOTE — Consult Note (Signed)
 NEUROLOGY CONSULT NOTE   Date of service: May 25, 2023 Patient Name: [name] MRN:  [id number] DOB:  1965/05/18 Chief Complaint: "Increasing difficulty with ambulation", history provided by patient, wife of 21 years at bedside, and chart review Requesting Provider: Tobey Grim, MD  History of Present Illness  [name] is a 58 y.o. 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  On Friday he became concerned about his stability and began to use a walker although he does not feel like there was any significant traumatic worsening that particular day; this was more preventative.  He particularly has noted difficulty rising from his bed.  Has not noticed any specific symptoms in the upper extremities, other than feeling like his handwriting has been gradually getting sloppier over the last few months.  He has felt shocklike sensations in the bilateral legs radiating down into the thighs that appears to be triggered by certain movements/activity.  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  Initially reported that he does not feel like his numbness has been significantly worse but then later and endorse some worsening tingling up into his thighs.  He has also been having some pain in his upper back that is new, described as a cramping/shooting pain across the upper shoulder blades, which has been most noticeable over the last 2 weeks.  Denies headache or any other focal neurological symptoms  He notes he races cars as a hobby and typically goes to the gym several times a week but has not been able to go to the gym  for the last few months due to his weakness  Additionally he has been receiving steroids so with his chemotherapy and notes that he had significant pain relief with the use of steroids and therefore his steroid dose was increased recently to try to address pain/fatigue   ROS  Comprehensive ROS performed and pertinent positives documented in HPI    Past History   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   History of hiatal hernia    Hyperlipidemia    Hypertension    Diagnostic exercise tolerance test assessment:04/30/2010 : comments normal -no evidence os ischemia by ST analysis   lipoma    Lipoma 01/26/2019   Mitral regurgitation    Echo 12/2018: EF 60-65, myxomatous mitral valve, severe mitral regurgitation, partial flail leaflet of posterior mitral valve, myxomatous tricuspid valve with trivial TR, ascending aorta mildly dilated (39 mm)   Pneumonia    as child   PONV (postoperative nausea and vomiting)    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   Complex valvuloplasty including  artificial Gore-tex neochord placement x6 with 28 mm Sorin Memo 4D ring annuloplasty via right mini thoracotomy approach   Solitary kidney    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    Past Surgical History:  Procedure Laterality Date   ABDOMINAL AORTOGRAM N/A 02/10/2020   Procedure: ABDOMINAL AORTOGRAM;  Surgeon: Corky Crafts, MD;  Location: [facility] INVASIVE CV LAB;  Service: Cardiovascular;   Laterality: N/A;   APPLICATION OF WOUND VAC  08/15/2021   Procedure: APPLICATION OF WOUND VAC;  Surgeon: Emelia Loron, MD;  Location: [facility] OR;  Service: General;;   BIOPSY  01/07/2019   Procedure: BIOPSY;  Surgeon: Charlott Rakes, MD;  Location: [facility] ENDOSCOPY;  Service: Endoscopy;;   BIOPSY  01/03/2020   Procedure: BIOPSY;  Surgeon: Charlott Rakes, MD;  Location: [facility]OS[facility];  Service: Endoscopy;;   COLON SURGERY  03/04/2019   left colon segmental resection   COLONOSCOPY WITH PROPOFOL N/A 01/07/2019   Procedure: COLONOSCOPY WITH PROPOFOL;  Surgeon: Charlott Rakes, MD;  Location: [facility]OSCOPY;  Service: Endoscopy;  Laterality: N/A;   COLONOSCOPY WITH PROPOFOL N/A 01/03/2020   Procedure: COLONOSCOPY WITH PROPOFOL;  Surgeon: Charlott Rakes, MD;  Location: [facility];  Service: Endoscopy;  Laterality: N/A;   INCISION AND DRAINAGE OF WOUND Right 08/13/2021   Procedure: IRRIGATION AND DEBRIDEMENT WOUND RIGHT GROIN;  Surgeon: Axel Filler, MD;  Location: [facility] OR;  Service: General;  Laterality: Right;   INGUINAL HERNIA REPAIR Left 04/23/2016   Procedure: LAPAROSCOPIC  INGUINAL REPAIR;  Surgeon: Berna Bue, MD;  Location: [facility] OR;  Service: General;  Laterality: Left;   IR IMAGING GUIDED PORT INSERTION  05/08/2022   IRRIGATION AND DEBRIDEMENT ABSCESS Right 08/15/2021   Procedure: IRRIGATION AND DEBRIDEMENT OF GROIN;  Surgeon: Emelia Loron, MD;  Location: [facility] OR;  Service: General;  Laterality: Right;   IRRIGATION AND DEBRIDEMENT SEBACEOUS CYST N/A 03/19/2023   Procedure: DEBRIDEMENT OF CHRONIC INFECTION;  Surgeon: Romie Levee, MD;  Location: [facility];  Service: General;  Laterality: N/A;   KNEE SURGERY     arthroscopic left knee   LIPOMA EXCISION  2015   x20 [facility]   MITRAL VALVE REPAIR Right 05/09/2020   Procedure: MINIMALLY INVASIVE MITRAL VALVE REPAIR (MVR) USING LIVANOVA MEMO 4D MITRAL RING;  Surgeon: Purcell Nails, MD;  Location: [facility] OR;  Service:  Open Heart Surgery;  Laterality: Right;   POLYPECTOMY  01/03/2020   Procedure: POLYPECTOMY;  Surgeon: Charlott Rakes, MD;  Location: [facility]OS[facility];  Service: Endoscopy;;   PORT-A-CATH REMOVAL     PORTACATH PLACEMENT N/A 03/16/2019   Procedure: INSERTION PORT-A-CATH;  Surgeon: Karie Soda, MD;  Location: [facility] ORS;  Service: General;  Laterality: N/A;   RIGHT/LEFT HEART CATH AND CORONARY ANGIOGRAPHY N/A 02/10/2020   Procedure: RIGHT/LEFT HEART CATH AND CORONARY ANGIOGRAPHY;  Surgeon: Corky Crafts, MD;  Location: [facility] INVASIVE CV LAB;  Service: Cardiovascular;  Laterality: N/A;   SUBMUCOSAL INJECTION  01/07/2019   Procedure: SUBMUCOSAL INJECTION;  Surgeon: Charlott Rakes, MD;  Location: [facility]COPY;  Service: Endoscopy;;   TEE WITHOUT CARDIOVERSION N/A 02/10/2020   Procedure: TRANSESOPHAGEAL ECHOCARDIOGRAM (TEE);  Surgeon: Elease Hashimoto Deloris Ping, MD;  Location: [facility] ENDOSCOPY;  Service: Cardiovascular;  Laterality: N/A;  CATH AFTER TEE   TEE WITHOUT CARDIOVERSION N/A 05/09/2020   Procedure: TRANSESOPHAGEAL ECHOCARDIOGRAM (TEE);  Surgeon: Purcell Nails, MD;  Location: [facility] OR;  Service: Open Heart Surgery;  Laterality: N/A;   THYROIDECTOMY, PARTIAL  01/05/2009   Right Thyroid  Lobectomy   TONSILLECTOMY     TOTAL NEPHRECTOMY Left 1970   Wilms Tumor left kidney excised as an infant    Family History: Family History  Problem Relation Age of Onset   Heart disease Father    Hernia Father    Healthy Sister    Stroke Paternal Grandmother    Cancer Mother        THYROID   Hypotension Mother    Heart attack Maternal Grandfather    Congestive Heart Failure Maternal Grandfather    Heart attack Paternal Grandfather    Colon cancer Maternal Aunt 79   Congestive Heart Failure Paternal Uncle    Congestive Heart Failure Maternal Grandmother    Colon cancer Other 74       MGMs brother   Colon cancer Other        MGFs brother    Social History  reports that he has never smoked. He has never  used smokeless tobacco. He reports that he does not currently use alcohol after a past usage of about 1.0 standard drink of alcohol per week. He reports that he does not use drugs.  Allergies  Allergen Reactions   Cat Dander Anaphylaxis   Empagliflozin Other (See Comments)    Recurrent UTI    Medications   Current Facility-Administered Medications:    acetaminophen (TYLENOL) tablet 500 mg, 500 mg, Oral, Daily PRN, Tobey Grim, MD   albuterol (PROVENTIL) (2.5 MG/3ML) 0.083% nebulizer solution 2.5 mg, 2.5 mg, Nebulization, Q6H PRN, Tobey Grim, MD   calcium carbonate (TUMS - dosed in mg elemental calcium) chewable tablet 200 mg of elemental calcium, 1 tablet, Oral, Daily PRN, Tobey Grim, MD   dabigatran (PRADAXA) capsule 150 mg, 150 mg, Oral, Q12H, Tobey Grim, MD   dexamethasone (DECADRON) tablet 4 mg, 4 mg, Oral, Q8H, Sherrill, Leighton Roach, MD   HYDROmorphone (DILAUDID) injection 1 mg, 1 mg, Intravenous, Q2H PRN, Earlie Lou L, MD, 1 mg at 05/25/23 1337   HYDROmorphone (DILAUDID) tablet 4-8 mg, 4-8 mg, Oral, Q4H PRN, Ladene Artist, MD, 8 mg at 05/25/23 1550   insulin aspart (novoLOG) injection 0-20 Units, 0-20 Units, Subcutaneous, TID WC, Rometta Emery, MD, 11 Units at 05/25/23 1518   insulin aspart (novoLOG) injection 0-5 Units, 0-5 Units, Subcutaneous, QHS, Garba, Mohammad L, MD   insulin glargine-yfgn (SEMGLEE) injection 20 Units, 20 Units, Subcutaneous, QHS, Tobey Grim, MD   lactated ringers infusion, , Intravenous, Continuous, Rometta Emery, MD, Last Rate: 40 mL/hr at 05/25/23 1601, Infusion Verify at 05/25/23 1601   lisinopril (ZESTRIL) tablet 5 mg, 5 mg, Oral, Daily, Tobey Grim, MD   LORazepam (ATIVAN) tablet 0.5 mg, 0.5 mg, Oral, Once PRN, Tobey Grim, MD   metoprolol succinate (TOPROL-XL) 24 hr tablet 25 mg, 25 mg, Oral, Daily, Tobey Grim, MD   ondansetron [facility]) tablet 4 mg, 4 mg, Oral, Q6H PRN **OR** ondansetron  (ZOFRAN) injection 4 mg, 4 mg, Intravenous, Q6H PRN, Earlie Lou L, MD, 4 mg at 05/25/23 1550  Current Outpatient Medications  Medication Instructions   acetaminophen (TYLENOL) 500 mg, Oral, Daily PRN   albuterol (PROVENTIL HFA;VENTOLIN HFA) 108 (90 BASE) MCG/ACT inhaler 1-2 puffs, Every 6 hours PRN   aspirin EC 81 mg, Oral, Daily, Swallow whole.   atorvastatin (LIPITOR) 10 mg, Oral, Daily   calcium carbonate (TUMS EX) 1,500 mg, Oral, Daily PRN   cetirizine (ZYRTEC) 10 mg, Daily at bedtime   dabigatran (PRADAXA) 150 mg, Oral,  2 times daily   dexamethasone (DECADRON) 4 mg, Oral, Daily, Start on 05/15/2023   fluticasone (FLONASE) 50 MCG/ACT nasal spray 1 spray, Daily PRN   HYDROcodone-acetaminophen (NORCO) 5-325 MG tablet 1-2 tablets, Oral, Every 6 hours PRN, Do not drive while taking   ibuprofen (ADVIL) 200 mg, Oral, Daily PRN   lidocaine-prilocaine (EMLA) cream 1 Application, Topical, As needed   lisinopril (ZESTRIL) 10 mg, Oral, Daily   lisinopril (ZESTRIL) 5 mg, Oral, Daily   loratadine (CLARITIN) 10 mg, Oral, Daily PRN   metFORMIN (GLUCOPHAGE) 1,000 mg, Oral, 2 times daily   methocarbamol (ROBAXIN) 500 mg, Oral, 2 times daily   metoprolol succinate (TOPROL-XL) 25 mg, Oral, Daily   mometasone (ASMANEX) 220 MCG/INH inhaler 1 puff, Inhalation, Daily   Omega-3 Fatty Acids (FISH OIL PO) 1,000 mg, Oral, 2 times daily   ONE TOUCH ULTRA TEST test strip 1 each, Other, As needed   oxyCODONE (OXY IR/ROXICODONE) 5-10 mg, Oral, Every 6 hours PRN, For pain not relieved with hydrocodone   pioglitazone (ACTOS) 30 mg, Oral, Daily   prochlorperazine (COMPAZINE) 10 mg, Oral, Every 6 hours PRN   repaglinide (PRANDIN) 2 mg, Oral, 2 times daily   Toujeo SoloStar 41 Units, Subcutaneous, Daily   traMADol (ULTRAM) 50 mg, Oral, Every 6 hours PRN     Vitals   Vitals:   05/25/23 0730 05/25/23 0945 05/25/23 1230 05/25/23 1500  BP:  (!) 148/83 (!) 152/80   Pulse:  93 92   Resp:  18 18   Temp: 97.9  F (36.6 C)   97.8 F (36.6 C)  TempSrc:      SpO2:  95% 95%   Weight:      Height:        Body mass index is 26.08 kg/m.  Physical Exam   Constitutional: Appears well-developed and well-nourished.  Psych: Affect appropriate to situation, pleasant and cooperative Eyes: No scleral injection.  HENT: No OP obstruction.  Head: Normocephalic.  Cardiovascular: Normal rate and regular rhythm.  Respiratory: Effort normal, non-labored breathing.  GI: Soft.  No distension. There is no tenderness.  Skin: Multiple lipomas throughout.  Some blackheads and other chronic rashes on his back  Neurologic Examination   Neuro: Mental Status: Patient is awake, alert, oriented to person, place, month, year, and situation and day of the week but not exact date. Patient is able to give a clear and coherent history. No signs of aphasia or neglect Cranial Nerves: II: Visual Fields are full. Pupils are equal, round, and reactive to light.   III,IV, VI: EOMI with chronic right eye mild ptosis (confirmed chronic by patient and family at bedside) V: Facial sensation is symmetric to light touch VII: Facial movement is symmetric.  VIII: hearing is intact to voice X: Uvula elevates symmetrically XI: Shoulder shrug is symmetric. XII: tongue is midline without atrophy or fasciculations.  Motor: 5/5 strength was present in all four extremities, other than 4/5 right biceps flexion, 4-/5 bilateral hip flexion, and 4/5 left knee extension with some crepitus noted in the left knee and patient appearing to wince with pain although denied significant pain Bulk exam is somewhat challenging in the setting of his significant widespread lipomas, does appear to have some mild muscle wasting in the hands bilaterally, slightly worse on the right Sensory: Sensation is symmetric to light touch and temperature in the arms and legs. On testing for spinal level on the back he reports gradually increasing intensity of  sharpness on the back, possibly as  sharper transition near C8 or C7 dermatome Length dependent loss of pinprick in the bilateral legs Reports no length dependent change in temperature in the arms/legs but does note that temperature sensation is stronger in the face compared to the extremities Vibration sensation is approximately 11 seconds in the right distal pinky (mildly diminished) Deep Tendon Reflexes: 1+ and symmetric biceps and brachial radialis (only able to elicit with distraction).  2+ and symmetric patellar and Achilles (when patient relaxes) Cerebellar: FNF and HKS are intact bilaterally (within limits of weakness in the lower extremities), other than mild end reach tremor in the left upper extremity    Labs/Imaging/Neurodiagnostic studies   CBC:  Recent Labs  Lab 05/24/2023 0828 05/24/23 1943 05/25/23 0541  WBC 11.3* 11.7* 9.6  NEUTROABS 8.4* 8.8*  --   HGB 10.1* 9.1* 9.4*  HCT 30.0* 28.9* 29.7*  MCV 98.7 103.2* 103.1*  PLT 53* PLATELET CLUMPS NOTED ON SMEAR, UNABLE TO ESTIMATE 72*   Basic Metabolic Panel:  Lab Results  Component Value Date   NA 132 (L) 05/25/2023   K 4.5 05/25/2023   CO2 25 05/25/2023   GLUCOSE 123 (H) 05/25/2023   BUN 21 (H) 05/25/2023   CREATININE 0.67 05/25/2023   CALCIUM 8.1 (L) 05/25/2023   GFRNONAA >60 05/25/2023   GFRAA 115 02/08/2020   Lipid Panel:  Lab Results  Component Value Date   LDLCALC 71 03/05/2014   HgbA1c:  Lab Results  Component Value Date   HGBA1C 9.5 (H) 05/25/2023   INR  Lab Results  Component Value Date   INR 1.1 08/13/2021   APTT  Lab Results  Component Value Date   APTT 27 08/13/2021    MRI Lumbar and Thoracis spine (Personally reviewed): 1. Diffuse osseous metastatic disease throughout the thoracic and lumbar spine and sacrum. No acute fracture or spinal canal stenosis. 2. Incompletely visualized left abdominal mass measuring at least 11.6 cm. 3. Worsened severe right and moderate left L4-L5 neural  foraminal stenosis due to combination of disc bulge and facet arthrosis.  ASSESSMENT   [name] is a 58 y.o. male with known metastatic colon cancer presenting with gradually progressive weakness over several months to the point that he has started using a walker for safety.  Examination is notable for proximal muscle weakness in the bilateral hip flexors.  Certainly his L4/L5 neuro foraminal stenosis may be contributing to this.  While he denies significant pain during examination he winces as if he is in significant pain when he does describe sciatic type pain  I am concerned that steroid myopathy may be playing a role here and therefore will reduce his steroids back to his most recent home dose of 4 mg daily rather than 4 mg every 8 hours. Additionally keep a therapy induced myopathy has been described particularly with 5-FU and IRI constituents of the FOLFIRI regimen he has recently been on (Chemotherapy-Induced Myopathy: The Dark Side of the Cachexia Sphere; Berdine Addison Campelj, et al -- [email or web address])  His upper back symptoms and mild left upper extremity weakness is also concerning for potential C-spine involvement.  Although overall the pattern of weakness is not consistent with myelopathy he does have increased reflexes in the lower extremities compared to the upper extremities, an unexpected finding in the setting of his neuropathy.  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 myopathy  RECOMMENDATIONS  - Taper steroids if possible, for now  have reduced from q8hr (which appears to be an increase) back to recent home dose of 4 mg daily - Continue to hold atorvastatin for now - Check CK (reassuring 188), Aldolase to assess for muscle injury - TSH, HIV, hepatitis panel to check for other treatable causes of myopathy - Check MRI C-spine with and without  - Neurology will follow along, appreciate comanagement by medicine and oncology team ______________________________________________________________________   Brooke Dare MD-PhD [facility] [phone] Available 7 AM to 7 PM, outside these hours please contact Neurologist on call listed on AMION   Discussed with Dr. Gwendolyn Grant via secure chat and phone

## 2023-05-25 NOTE — Telephone Encounter (Signed)
 Mrs. Reinders called requesting to speak with [name] regarding his condition. He is still in [facility] emergency in a lot of pain and is considering taking him home due to her perceived lack of care in ED.

## 2023-05-26 DIAGNOSIS — R29898 Other symptoms and signs involving the musculoskeletal system: Secondary | ICD-10-CM | POA: Diagnosis not present

## 2023-05-26 LAB — VITAMIN B12: Vitamin B-12: 1939 pg/mL — ABNORMAL HIGH (ref 180–914)

## 2023-05-26 LAB — HEPATITIS PANEL, ACUTE
HCV Ab: NONREACTIVE
Hep A IgM: NONREACTIVE
Hep B C IgM: NONREACTIVE
Hepatitis B Surface Ag: NONREACTIVE

## 2023-05-26 LAB — CBC
HCT: 26.9 % — ABNORMAL LOW (ref 39.0–52.0)
Hemoglobin: 8.8 g/dL — ABNORMAL LOW (ref 13.0–17.0)
MCH: 33.2 pg (ref 26.0–34.0)
MCHC: 32.7 g/dL (ref 30.0–36.0)
MCV: 101.5 fL — ABNORMAL HIGH (ref 80.0–100.0)
Platelets: 79 10*3/uL — ABNORMAL LOW (ref 150–400)
RBC: 2.65 MIL/uL — ABNORMAL LOW (ref 4.22–5.81)
RDW: 18.5 % — ABNORMAL HIGH (ref 11.5–15.5)
WBC: 9.6 10*3/uL (ref 4.0–10.5)
nRBC: 0 % (ref 0.0–0.2)

## 2023-05-26 LAB — COMPREHENSIVE METABOLIC PANEL
ALT: 9 U/L (ref 0–44)
AST: 18 U/L (ref 15–41)
Albumin: 2.4 g/dL — ABNORMAL LOW (ref 3.5–5.0)
Alkaline Phosphatase: 695 U/L — ABNORMAL HIGH (ref 38–126)
Anion gap: 10 (ref 5–15)
BUN: 18 mg/dL (ref 6–20)
CO2: 24 mmol/L (ref 22–32)
Calcium: 8.3 mg/dL — ABNORMAL LOW (ref 8.9–10.3)
Chloride: 99 mmol/L (ref 98–111)
Creatinine, Ser: 0.57 mg/dL — ABNORMAL LOW (ref 0.61–1.24)
GFR, Estimated: >60 mL/min (ref >60)
Glucose, Bld: 186 mg/dL — ABNORMAL HIGH (ref 70–99)
Potassium: 4.8 mmol/L (ref 3.5–5.1)
Sodium: 133 mmol/L — ABNORMAL LOW (ref 135–145)
Total Bilirubin: 0.8 mg/dL (ref 0.0–1.2)
Total Protein: 6 g/dL — ABNORMAL LOW (ref 6.5–8.1)

## 2023-05-26 LAB — GLUCOSE, CAPILLARY
Glucose-Capillary: 173 mg/dL — ABNORMAL HIGH (ref 70–99)
Glucose-Capillary: 197 mg/dL — ABNORMAL HIGH (ref 70–99)
Glucose-Capillary: 278 mg/dL — ABNORMAL HIGH (ref 70–99)
Glucose-Capillary: 250 mg/dL — ABNORMAL HIGH (ref 70–99)

## 2023-05-26 LAB — HIV ANTIBODY (ROUTINE TESTING W REFLEX): HIV Screen 4th Generation wRfx: NONREACTIVE

## 2023-05-26 LAB — TSH: TSH: 0.638 u[IU]/mL (ref 0.350–4.500)

## 2023-05-26 MED ORDER — HEPARIN (PORCINE) 25000 UT/250ML-% IV SOLN
1600.0000 [IU]/h | INTRAVENOUS | Status: DC
  Administered 2023-05-27: 1400 [IU]/h via INTRAVENOUS
  Filled 2023-05-26: qty 250

## 2023-05-26 MED ORDER — BUDESONIDE 0.25 MG/2ML IN SUSP
0.2500 mg | Freq: Two times a day (BID) | RESPIRATORY_TRACT | Status: DC
  Administered 2023-05-26 – 2023-05-28 (×5): 0.25 mg via RESPIRATORY_TRACT
  Filled 2023-05-26 (×5): qty 2

## 2023-05-26 NOTE — Progress Notes (Signed)
 OT Cancellation Note  Patient Details Name: [name] MRN: [id number] DOB: 03/05/1966   Cancelled Treatment:    Reason Eval/Treat Not Completed: OT screened, no needs identified, will sign off Patient reported he had already had OT prior and knew strategies and did not need review. OT to sign off at this time.  Rosalio Loud, MS Acute Rehabilitation Department Office# [phone]  05/26/2023, 3:12 PM

## 2023-05-26 NOTE — Progress Notes (Signed)
 IP PROGRESS NOTE  Subjective:   [name] reports improvement in pain with Dilaudid.  He continues to have leg weakness, but he is able to ambulate.  His wife was at the bedside when I saw him this morning.  Objective: Vital signs in last 24 hours: Blood pressure 128/88, pulse 82, temperature 98.1 F (36.7 C), temperature source Oral, resp. rate 18, height 5\' 11"  (1.803 m), weight 187 lb (84.8 kg), SpO2 97%.  Intake/Output from previous day: 02/24 0701 - 02/25 0700 In: 575.6 [I.V.:575.6] Out: -   Physical Exam:  HEENT: No thrush or ulcers  Abdomen: Nontender, no hepatosplenomegaly Extremities: No leg edema Neurologic: Alert and oriented, the motor exam appears intact in the arms/hands bilaterally.  Decreased strength at the left greater than right leg with hip flexion/knee extension, 3-4/5 strength with flexion at the left hip, 4+/5 strength with plantarflexion of the left foot  Portacath/PICC-without erythema  Lab Results: Recent Labs    05/25/23 0541 05/26/23 0450  WBC 9.6 9.6  HGB 9.4* 8.8*  HCT 29.7* 26.9*  PLT 72* 79*    BMET Recent Labs    05/25/23 0541 05/26/23 0450  NA 132* 133*  K 4.5 4.8  CL 98 99  CO2 25 24  GLUCOSE 123* 186*  BUN 21* 18  CREATININE 0.67 0.57*  CALCIUM 8.1* 8.3*    Lab Results  Component Value Date   CEA1 2.00 07/13/2020   CEA 38.61 (H) 05/20/2023    Studies/Results: MR CERVICAL SPINE W WO CONTRAST Result Date: 05/26/2023 CLINICAL DATA:  Initial evaluation for metastatic disease evaluation, myelopathy. EXAM: MRI CERVICAL SPINE WITHOUT AND WITH CONTRAST TECHNIQUE: Multiplanar and multiecho pulse sequences of the cervical spine, to include the craniocervical junction and cervicothoracic junction, were obtained without and with intravenous contrast. CONTRAST:  8mL GADAVIST GADOBUTROL 1 MMOL/ML IV SOLN COMPARISON:  Prior study from 02/04/2022 FINDINGS: Alignment: Straightening with mild reversal of the normal cervical lordosis. No  significant listhesis. Vertebrae: Diffusely abnormal and heterogeneous appearance of the visualized bone marrow, consistent with diffuse osseous metastatic disease. There is involvement of essentially all levels including the skull base. Small amount of extraosseous/epidural extension noted within the right ventral epidural space at T1 (series 6, image 7). No other significant extra osseous or epidural extension of tumor. No pathologic compression fracture. Cord: Normal signal and morphology. Posterior Fossa, vertebral arteries, paraspinal tissues: Unremarkable. Disc levels: C2-C3: Negative interspace. Mild left-sided facet hypertrophy. No spinal stenosis. Foramina remain patent. C3-C4: Small left paracentral disc protrusion mildly flattens and indents the left ventral thecal sac. Superimposed bilateral uncovertebral spurring with mild left-sided facet hypertrophy. No significant spinal stenosis. Mild left C4 foraminal narrowing. Right neural foramen remains patent. C4-C5: Degenerative intervertebral disc space narrowing with diffuse disc bulge and bilateral uncovertebral spurring. No spinal stenosis. Mild left C5 foraminal stenosis. Right neural foramina remains patent. C5-C6: Mild disc bulge with bilateral uncovertebral spurring. Mild left-sided facet spurring. No significant spinal stenosis. Mild left C6 foraminal narrowing. Right neural foramina remains patent. C6-C7: Degenerative disc space narrowing with diffuse disc bulge and bilateral uncovertebral spurring, slightly asymmetric to the left. No significant spinal stenosis. Foramina remain patent. C7-T1: Degenerate intervertebral disc space narrowing with diffuse disc bulge and bilateral uncovertebral spurring. No significant spinal stenosis. Foramina remain patent. IMPRESSION: 1. Diffuse osseous metastatic disease involving the cervical spine. Small amount of extraosseous/epidural extension of tumor within the right ventral epidural space at T1 without  stenosis. No other significant extra osseous or epidural extension of tumor. No pathologic  compression fracture. 2. Underlying mild multilevel cervical spondylosis without significant spinal stenosis. Mild left C4 through C6 foraminal narrowing. Electronically Signed   By: Rise Mu M.D.   On: 05/26/2023 06:42   MR BRAIN W WO CONTRAST Result Date: 05/26/2023 CLINICAL DATA:  Initial evaluation for metastatic disease evaluation. EXAM: MRI HEAD WITHOUT AND WITH CONTRAST TECHNIQUE: Multiplanar, multiecho pulse sequences of the brain and surrounding structures were obtained without and with intravenous contrast. CONTRAST:  8mL GADAVIST GADOBUTROL 1 MMOL/ML IV SOLN COMPARISON:  Comparison made with prior MRI from 03/01/2014. FINDINGS: Brain: Cerebral volume within normal limits. Patchy T2/FLAIR hyperintensity involving the supratentorial cerebral white matter, most consistent with chronic small vessel ischemic disease, mild in nature. No evidence for acute or subacute infarct. No acute intracranial hemorrhage. Few scattered chronic micro hemorrhages noted, most prominent of which present at the left lentiform nucleus. Punctate focus of enhancement noted at the left parieto-occipital junction (series 16, image 101). Additional punctate focus of enhancement noted within the left parietal lobe superiorly (series 16, image 116). Findings are nonspecific. No other mass lesion or abnormal enhancement. No other evidence for intra-axial metastatic disease. No mass effect or midline shift. No hydrocephalus or extra-axial fluid collection. Pituitary gland suprasellar region within normal limits. Vascular: Major intracranial vascular flow voids are maintained. Skull and upper cervical spine: Craniocervical junction within normal limits. Heterogeneous signal intensity within the visualized bone marrow with a few probable marrow replacing lesions about the skull base and calvarium, consistent with osseous metastatic  disease. No visible extra osseous extension of tumor. No scalp soft tissue abnormality. Sinuses/Orbits: Globes and orbital soft tissues within normal limits. Paranasal sinuses are largely clear. Trace left mastoid effusion noted, of doubtful significance. Other: None. IMPRESSION: 1. Two punctate foci of enhancement involving the left parieto-occipital region as above. While these findings are nonspecific, tiny metastatic lesions are suspected given patient history. A short interval follow-up MRI could be performed for further evaluation and to evaluate for interval change as warranted. 2. Heterogeneous bone marrow signal intensity with a few marrow replacing lesions about the skull base and calvarium, consistent with osseous metastatic disease. 3. No other acute intracranial abnormality. 4. Punctate underlying mild chronic microvascular ischemic disease. Electronically Signed   By: Rise Mu M.D.   On: 05/26/2023 06:32   MR Lumbar Spine W Wo Contrast Result Date: 05/24/2023 CLINICAL DATA:  Metastatic disease evaluation EXAM: MRI THORACIC AND LUMBAR SPINE WITHOUT AND WITH CONTRAST TECHNIQUE: Multiplanar and multiecho pulse sequences of the thoracic and lumbar spine were obtained without and with intravenous contrast. CONTRAST:  8mL GADAVIST GADOBUTROL 1 MMOL/ML IV SOLN COMPARISON:  None Available. FINDINGS: MRI THORACIC SPINE FINDINGS Alignment:  Levoscoliosis.  No static subluxation. Vertebrae: There is diffuse osseous metastatic disease affecting all of the thoracic levels. There is no acute compression fracture. Cord:  Normal signal and morphology. Paraspinal and other soft tissues: Limited visualization due to motion. Disc levels: There is no spinal canal stenosis. No abnormal epidural soft tissue. MRI LUMBAR SPINE FINDINGS Segmentation:  Standard. Alignment: S shaped scoliosis with right apex at L1 and left apex at L5. Vertebrae: Diffuse osseous metastatic disease throughout the lumbar spine and  sacrum. There is mild multilevel height loss but no acute fracture. Conus medullaris: Extends to the L1 level and appears normal. Paraspinal and other soft tissues: Incompletely visualized left abdominal mass measuring at least 11.6 cm. Disc levels: L1-L2: Normal disc space and facet joints. No spinal canal stenosis. No neural foraminal stenosis. L2-L3: Small disc  bulge. No spinal canal stenosis. No neural foraminal stenosis. L3-L4: Small disc bulge. No spinal canal stenosis. Mild bilateral neural foraminal stenosis. L4-L5: Small right asymmetric disc bulge and mild facet arthrosis. No spinal canal stenosis. Worsened severe right and moderate left neural foraminal stenosis. L5-S1: Normal disc space and facet joints. No spinal canal stenosis. No neural foraminal stenosis. Visualized sacrum: Normal. IMPRESSION: 1. Diffuse osseous metastatic disease throughout the thoracic and lumbar spine and sacrum. No acute fracture or spinal canal stenosis. 2. Incompletely visualized left abdominal mass measuring at least 11.6 cm. 3. Worsened severe right and moderate left L4-L5 neural foraminal stenosis due to combination of disc bulge and facet arthrosis. Electronically Signed   By: Deatra Robinson M.D.   On: 05/24/2023 21:51   MR THORACIC SPINE W WO CONTRAST Result Date: 05/24/2023 CLINICAL DATA:  Metastatic disease evaluation EXAM: MRI THORACIC AND LUMBAR SPINE WITHOUT AND WITH CONTRAST TECHNIQUE: Multiplanar and multiecho pulse sequences of the thoracic and lumbar spine were obtained without and with intravenous contrast. CONTRAST:  8mL GADAVIST GADOBUTROL 1 MMOL/ML IV SOLN COMPARISON:  None Available. FINDINGS: MRI THORACIC SPINE FINDINGS Alignment:  Levoscoliosis.  No static subluxation. Vertebrae: There is diffuse osseous metastatic disease affecting all of the thoracic levels. There is no acute compression fracture. Cord:  Normal signal and morphology. Paraspinal and other soft tissues: Limited visualization due to  motion. Disc levels: There is no spinal canal stenosis. No abnormal epidural soft tissue. MRI LUMBAR SPINE FINDINGS Segmentation:  Standard. Alignment: S shaped scoliosis with right apex at L1 and left apex at L5. Vertebrae: Diffuse osseous metastatic disease throughout the lumbar spine and sacrum. There is mild multilevel height loss but no acute fracture. Conus medullaris: Extends to the L1 level and appears normal. Paraspinal and other soft tissues: Incompletely visualized left abdominal mass measuring at least 11.6 cm. Disc levels: L1-L2: Normal disc space and facet joints. No spinal canal stenosis. No neural foraminal stenosis. L2-L3: Small disc bulge. No spinal canal stenosis. No neural foraminal stenosis. L3-L4: Small disc bulge. No spinal canal stenosis. Mild bilateral neural foraminal stenosis. L4-L5: Small right asymmetric disc bulge and mild facet arthrosis. No spinal canal stenosis. Worsened severe right and moderate left neural foraminal stenosis. L5-S1: Normal disc space and facet joints. No spinal canal stenosis. No neural foraminal stenosis. Visualized sacrum: Normal. IMPRESSION: 1. Diffuse osseous metastatic disease throughout the thoracic and lumbar spine and sacrum. No acute fracture or spinal canal stenosis. 2. Incompletely visualized left abdominal mass measuring at least 11.6 cm. 3. Worsened severe right and moderate left L4-L5 neural foraminal stenosis due to combination of disc bulge and facet arthrosis. Electronically Signed   By: Deatra Robinson M.D.   On: 05/24/2023 21:51    Medications: I have reviewed the patient's current medications.  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, status post a left colectomy 03/04/2019 Mass felt to be in the transverse colon on a colonoscopy 01/07/2019 with a biopsy confirming poorly differentiated adenocarcinoma with signet ring cell  features, mismatch repair protein expression normal CT abdomen/pelvis 01/08/2019 12.3 cm indeterminate splenic mass, present in 2015 on a lumbar MRI-felt to be benign, small subpleural and pleural-based nodules at the lung bases, status post left nephrectomy CT chest 01/20/2019-small bilateral pulmonary nodules with rounded parenchymal nodules in the right lower lobe, indeterminate splenic mass CT chest 03/18/2019-multiple subcentimeter pulmonary nodules again seen bilaterally, greatest in the lower lobes, without significant change.  No new  or enlarging pulmonary nodules or masses.  Partially visualized large heterogeneous splenic mass with no significant change.  Plan for follow-up chest CT at a 76-month interval. Cycle 1 FOLFOX 03/21/2019 Cycle 2 FOLFOX 04/04/2019 Cycle 3 FOLFOX 04/18/2019 (oxaliplatin dose reduced secondary to thrombocytopenia) Cycle 4 FOLFOX 05/02/2019 (oxaliplatin held secondary to thrombocytopenia) Cycle 5 FOLFOX 05/16/2019 Cycle 6 FOLFOX 05/30/2019 (oxaliplatin held secondary to thrombocytopenia) CT chest 06/08/2019-stable scattered small solid pulmonary nodules  Cycle 7 FOLFOX 06/13/2019 Cycle 8 FOLFOX 06/27/2019 Cycle 9 FOLFOX 07/11/2019 Cycle 10 FOLFOX 07/25/2019 (oxaliplatin held due to thrombocytopenia) Cycle 11 FOLFOX 08/08/2019 Cycle 12 FOLFOX 08/22/2019 CTs 01/09/2020-no evidence of recurrent disease, stable lung nodules favored to represent subpleural lymph nodes-dedicated follow-up not recommended CTs 01/11/2021-no evidence of recurrent disease CTs 01/24/2022-no evidence of recurrent disease CT lumbar spine, abdomen/pelvis 04/11/2022-numerous mixed lytic and sclerotic lesions scattered throughout the visualized axial and appendicular skeleton including several areas in the lumbar spine.   04/21/2022 CT biopsy sclerotic lesion right anterior iliac bone-consistent with metastatic colorectal adenocarcinoma, moderate to poorly differentiated.  Foundation 1-microsatellite stable,  tumor mutation burden 4, K-ras G12S PET scan 05/09/2022-widespread hypermetabolic mixed faintly lytic and sclerotic osseous metastases throughout the axial and proximal appendicular skeleton; associated mild pathologic compression fracture at L3; no hypermetabolic extraosseous metastatic disease. Cycle 1 FOLFIRI/bevacizumab 05/26/2022 Cycle 2 FOLFIRI/bevacizumab 06/09/2022 Cycle 3 FOLFIRI 06/24/2022, bevacizumab 06/26/2022 Cycle 4 FOLFIRI 07/08/2022, bevacizumab held due to proteinuria Cycle 5 FOLFIRI 07/22/2022, bevacizumab held due to proteinuria CTs 07/31/2022-potentially areas of sclerosis represent interval healing; new lytic lesion at T12, multiple new areas of punched-out sclerosis throughout the visualized osseous structures.  Index lesion in the left iliac wing measures 1.3 x 2.8 cm.  Healing of previously metabolically occult metastatic disease on 05/09/2022 is a possibility. Cycle 6 FOLFIRI 08/04/2022, irinotecan dose reduced due to thrombocytopenia, bevacizumab held pending 24-hour urine result Cycle 7 FOLFIRI 08/18/2022, bevacizumab held secondary to proteinuria, G-CSF held Cycle 8 FOLFIRI 09/01/2022, bevacizumab held, G-CSF Cycle 9 FOLFIRI 09/16/2022, bevacizumab held, G-CSF CTs 09/29/2022-enlargement of a hypodense lesion posterior left lobe of the liver, similar diffuse mixed lytic and sclerotic osseous metastatic disease, multiple new small nodules medial right lung base, multiple other small bilateral pulmonary nodules unchanged. Cycle 10 FOLFIRI plus Avastin 10/08/2022 Cycle 11 FOLFIRI 10/27/2022, Avastin held Cycle 12 FOLFIRI/Avastin 11/11/2022 Cycle 13 FOLFIRI/Avastin 11/25/2022 CTs 12/08/2022-stable lesion posterior left lobe of the liver.  Unchanged diffuse lytic and sclerotic osseous metastatic disease.  Stable small solid pulmonary nodules.  Stable splenic lesions.  Slightly increased bladder wall thickening. Cycle 14 FOLFIRI/Avastin 12/10/2022 Cycle 15 FOLFIRI/Avastin 12/24/2022 12/31/2022 24-hour  urine protein 710 mg Cycle 16 FOLFIRI/Avastin 01/07/2023 Cycle 17 FOLFIRI 01/21/2023, Avastin held secondary to persistent proteinuria, slight increase in creatinine, and drainage from the suprapubic lesion CTs 01/26/2023-unchanged widespread sclerotic osseous metastatic disease, unchanged hypodense liver lesions, occasional small bilateral lung nodules unchanged. Cycle 18 FOLFIRI 02/03/2023, Avastin held Cycle 19 FOLFIRI 02/17/2023, Avastin held Cycle 20 FOLFIRI 03/03/2023, Avastin held Cycle 21 FOLFIRI 03/16/2023, Avastin held CTs 04/04/2023: Right increased sclerosis of osseous metastatic disease with new sclerotic lesions of the sternum/manubrium and increase sclerotic metastases throughout the pelvis, unchanged hypodense liver lesions Cycle 22 FOLFIRI 04/07/2023, Avastin held Cycle 23 FOLFIRI/Avastin 04/22/2023 Cycle 24 FOLFIRI/Avastin 05/06/2023   Multiple colon polyps-ascending colon polyps on the colonoscopy 01/07/2019-not removed Colonoscopy 01/03/2020-multiple polyps removed, tubular adenomas, inflammatory and hyperplastic polyps Wilms tumor at age 3, 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  Left nephrectomy at age 65 12.  Port-A-Cath placement, Dr. Michaell Cowing, 03/16/2019 13.  Thrombocytopenia secondary to chemotherapy-oxaliplatin dose reduced beginning with cycle 3 FOLFOX, improved 14.  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  Right groin soft tissue infection May 2023-status postsurgical debridement 18.  Left leg DVT-common femoral, femoral, popliteal, posterior  tibial, and peroneal veins 19.  02/04/2022-MRI cervical spine-1.7 cm T1 lesion-indeterminate; bone scan 03/14/2022-focal intense activity at approximate T1 level felt to correspond to the lesion on MRI, additional foci of activity involving the lower thoracic and lumbar spine, right iliac bone and pubic symphysis.  CT lumbar spine, abdomen/pelvis 04/11/2022-numerous mixed lytic and sclerotic lesions scattered throughout the visualized axial and appendicular skeleton including several areas in the lumbar spine.  04/21/2022 CT biopsy sclerotic lesion right anterior iliac bone-consistent with metastatic colorectal adenocarcinoma, moderate to poorly differentiated 20.  Inflamed sebaceous cyst 08/18/2022-doxycycline Surgical resection of area of "hidradenitis " 03/19/2023 21.  Admission 05/24/2023 with increased back pain and new onset leg weakness MRI thoracic and lumbar spine 05/24/2023: Fuhs osseous metastatic disease, no acute compression fracture, normal cord signal but no spinal canal stenosis, no abnormal epidural soft tissue MRI cervical spine 05/25/2023: Diffuse osseous metastatic disease, small amount of extraosseous/epidural tumor at the right ventral epidural space at T1 without stenosis, no pathologic compression fracture MRI brain 05/25/2023: 2 punctate foci of enhancement in the left parieto-occipital region-tiny metastases suspected, osseous metastatic disease     Mr. Ottaviano has metastatic colon cancer with extensive bone metastases.  He is admitted with increased pain and new onset leg weakness.  MRIs of the spine and brain reveal no clear explanation for the neurologic symptoms.  Pain is secondary to extensive bone metastases.  I doubt the leg weakness is related to steroids (he started Decadron 05/06/2023), but steroid or chemotherapy induced myopathy are possible.  The asymmetry of his symptoms also argues against treatment related myopathy.  I will contact Dr. Kathrynn Running to see whether radiation to the  lower lumbar spine could be beneficial given the distribution of his symptoms.  I appreciate the consultation from neurology.  Mr. Cowdrey is scheduled for a consultation at [facility] next week.  He will not be able to make the trip to [place] unless his clinical status improves.  Recommendations: Continue Dilaudid for pain PT/OT evaluation Out of bed and ambulate as tolerated I will contact Dr. Kathrynn Running to discuss the indication for additional radiation   LOS: 2 days   Thornton Papas, MD   05/26/2023, 1:29 PM

## 2023-05-26 NOTE — Progress Notes (Signed)
 PHARMACY - ANTICOAGULATION CONSULT NOTE  Pharmacy Consult for heparin Indication: history DVT  Allergies  Allergen Reactions   Cat Dander Anaphylaxis   Empagliflozin Other (See Comments)    Recurrent UTI    Patient Measurements: Height: 5\' 11"  (180.3 cm) Weight: 84.8 kg (187 lb) IBW/kg (Calculated) : 75.3 Heparin Dosing Weight: 84.8 kg  Vital Signs: Temp: 98.1 F (36.7 C) (02/25 1321) Temp Source: Oral (02/25 1321) BP: 128/88 (02/25 1321) Pulse Rate: 82 (02/25 1321)  Labs: Recent Labs    05/24/23 1912 05/24/23 1912 05/24/23 1943 05/25/23 0115 05/25/23 0541 05/25/23 1833 05/26/23 0450  HGB  --    < > 9.1*  --  9.4*  --  8.8*  HCT  --   --  28.9*  --  29.7*  --  26.9*  PLT  --   --  PLATELET CLUMPS NOTED ON SMEAR, UNABLE TO ESTIMATE  --  72*  --  79*  CREATININE 0.48*  --   --  0.57* 0.67  --  0.57*  CKTOTAL  --   --   --   --   --  188  --   TROPONINIHS 6  --   --   --   --   --   --    < > = values in this interval not displayed.    Estimated Creatinine Clearance: 111.1 mL/min (A) (by C-G formula based on SCr of 0.57 mg/dL (L)).   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   History of hiatal hernia    Hyperlipidemia    Hypertension    Diagnostic exercise tolerance test assessment:04/30/2010 : comments normal -no evidence os ischemia by ST analysis   lipoma    Lipoma 01/26/2019   Mitral regurgitation    Echo 12/2018: EF 60-65, myxomatous mitral valve, severe mitral regurgitation,  partial flail leaflet of posterior mitral valve, myxomatous tricuspid valve with trivial TR, ascending aorta mildly dilated (39 mm)   Pneumonia    as child   PONV (postoperative nausea and vomiting)    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   Complex valvuloplasty including artificial Gore-tex neochord placement x6 with 28 mm Sorin Memo 4D ring annuloplasty via right mini thoracotomy approach   Solitary kidney    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     Assessment: 57 year old male with history of DVT on Pradaxa PTA, which was continued on admission. Pending possible muscle biopsy. Pharmacy consulted for heparin management. Discussed with [facility] - given relatively high risk with active cancer, will switch to heparin infusion rather than VTE prophylaxis.  Hgb and plt low but stable. Last dose of Pradaxa 150 mg 2/25 at 1200.  Goal of Therapy:  Heparin level 0.3-0.7 units/ml Monitor platelets by anticoagulation protocol: Yes   Plan:  -Start heparin infusion 12 hours after last dose of Pradaxa -  to start 2/26 at 0000 -Heparin infusion at 1400 units/hr -Check 6 hour heparin level following start of infusion -Daily CBC -Heparin to be held prior to procedure at discretion of provider - please contact pharmacy or place orders for heparin stop time as indicated  Pricilla Riffle, PharmD, BCPS Clinical Pharmacist 05/26/2023 6:11 PM

## 2023-05-26 NOTE — Evaluation (Signed)
 Physical Therapy Evaluation Patient Details Name: [name] MRN: [id number] DOB: [birth year] Today's Date: 05/26/2023  History of Present Illness  Pt presents with increasing lower back pain and weakness. 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 Pt reported back pain a 3/5 and was able to ambulate in the hall with a RW supervision and accept minimal challenges to his balance. Pt would benefit from acute skilled PT to focus on balance, activity tolerance and progressing gait without an assistive device. Pt will be safe to d/c home with family when medically stable and does need follow- up therapy at this time.        If plan is discharge home, recommend the following: Help with stairs or ramp for entrance;Assist for transportation;Assistance with cooking/housework   Can travel by private vehicle        Equipment Recommendations None recommended by PT  Recommendations for Other Services       Functional Status Assessment Patient has had a recent decline in their functional status and demonstrates the ability to make significant improvements in function in a reasonable and predictable amount of time.     Precautions / Restrictions Precautions Precautions: Fall Restrictions Weight Bearing Restrictions Per Provider Order: No      Mobility  Bed Mobility Overal bed mobility: Modified Independent               Patient Response: Cooperative  Transfers Overall transfer level: Modified independent Equipment used: Rolling walker (2 wheels)                     Ambulation/Gait Ambulation/Gait assistance: Supervision Gait Distance (Feet): 200 Feet Assistive device: Rolling walker (2 wheels) Gait Pattern/deviations: Step-through pattern, Decreased stride length Gait velocity: decreased     General Gait Details: No LOB, increased fatigue, decreased velocity, able to tolerate mild balance challanges  Stairs            Wheelchair Mobility     Tilt Bed Tilt Bed Patient Response: Cooperative  Modified Rankin (Stroke Patients Only)       Balance Overall balance assessment: Mild deficits observed, not formally tested                                           Pertinent Vitals/Pain Pain Assessment Pain Assessment: 0-10 Pain Score: 3  Pain Location: back Pain Descriptors / Indicators: Discomfort Pain Intervention(s): Limited activity within patient's tolerance, Monitored during session    Home Living Family/patient expects to be discharged to:: Private residence Living Arrangements: Spouse/significant other Available Help at Discharge: Family Type of Home: House Home Access: Stairs to enter Entrance Stairs-Rails: None Entrance Stairs-Number of Steps: 2   Home Layout: One level Home Equipment: Agricultural consultant (2 wheels);Cane - quad      Prior Function Prior Level of Function : Independent/Modified Independent;Working/employed;Driving             Mobility Comments: was going to the gym       Extremity/Trunk Assessment   Upper Extremity Assessment Upper Extremity Assessment: Defer to OT evaluation  Lower Extremity Assessment Lower Extremity Assessment: LLE deficits/detail;RLE deficits/detail RLE Deficits / Details: hip flexion 4/5, knee flexion, ext 4+/5 ankle-WNL RLE Sensation: history of peripheral neuropathy LLE Deficits / Details: hip flexion 4/5, kenn flex/ext 4+/5 ankle WNL LLE Sensation: history of peripheral neuropathy    Cervical / Trunk Assessment Cervical / Trunk Assessment:  Other exceptions Cervical / Trunk Exceptions: scoliosis  Communication   Communication Communication: No apparent difficulties    Cognition Arousal: Alert Behavior During Therapy: WFL for tasks assessed/performed   PT - Cognitive impairments: No apparent impairments                         Following commands: Intact       Cueing Cueing Techniques: Verbal cues     General Comments      Exercises     Assessment/Plan    PT Assessment Patient needs continued PT services  PT Problem List Decreased strength;Decreased activity tolerance;Decreased balance;Pain;Decreased mobility;Impaired sensation       PT Treatment Interventions DME instruction;Gait training;Stair training;Patient/family education;Functional mobility training;Therapeutic activities;Therapeutic exercise;Balance training    PT Goals (Current goals can be found in the Care Plan section)  Acute Rehab PT Goals Patient Stated Goal: To return to work and the gym PT Goal Formulation: With patient Time For Goal Achievement: 06/16/23 Potential to Achieve Goals: Good    Frequency Min 1X/week     Co-evaluation               AM-PAC PT "6 Clicks" Mobility  Outcome Measure Help needed turning from your back to your side while in a flat bed without using bedrails?: None Help needed moving from lying on your back to sitting on the side of a flat bed without using bedrails?: None Help needed moving to and from a bed to a chair (including a wheelchair)?: None Help needed standing up from a chair using your arms (e.g., wheelchair or bedside chair)?: None Help needed to walk in hospital room?: A Little Help needed climbing 3-5 steps with a railing? : A Little 6 Click Score: 22    End of Session   Activity Tolerance: Patient tolerated treatment well Patient left: in bed;with call bell/phone within reach Nurse Communication: Mobility status PT Visit Diagnosis: Muscle weakness (generalized)  (M62.81);Pain;Difficulty in walking, not elsewhere classified (R26.2) Pain - part of body:  (back)    Time: 1610-9604 PT Time Calculation (min) (ACUTE ONLY): 25 min   Charges:   PT Evaluation $PT Eval Low Complexity: 1 Low PT Treatments $Gait Training: 8-22 mins PT General Charges $$ ACUTE PT VISIT: 1 Visit         Greggory Stallion 05/26/2023, 12:48 PM

## 2023-05-26 NOTE — Progress Notes (Signed)
 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 Neurology consulted as well and concern for neuropathy vs myopathy. Patient has been on FOOLFIRI/AVASTIN (last dose 05/06/23), but has also been on steroids to aid in pain management. 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 Patient is also on pradaxa for previous DVT, he has been on this while admitted. Concern for risk of bleeding and poor healing. After further discussion with neurology at bedside will hold off on muscle biopsy for now and likely plan outpatient EMG testing. If that is inconclusive or muscle biopsy desired after further workup then outpatient referral to [facility] office is certainly an option. If anything changes more acutely and patient requires re-evaluation for muscle biopsy while inpatient, please reconsult acute care surgical service.   Juliet Rude, [facility] Surgery 05/26/2023, 2:37 PM Please see Amion for pager number during day hours 7:00am-4:30pm   NO CHARGE

## 2023-05-26 NOTE — Plan of Care (Signed)
 Problem: Education: Goal: Ability to describe self-care measures that may prevent or decrease complications (Diabetes Survival Skills Education) will improve 05/26/2023 0422 by Elbert Ewings, RN Outcome: Not Progressing 05/26/2023 0422 by Elbert Ewings, RN Outcome: Not Progressing Goal: Individualized Educational Video(s) 05/26/2023 0422 by Elbert Ewings, RN Outcome: Not Progressing 05/26/2023 0422 by Elbert Ewings, RN Outcome: Not Progressing   Problem: Coping: Goal: Ability to adjust to condition or change in health will improve 05/26/2023 0422 by Elbert Ewings, RN Outcome: Not Progressing 05/26/2023 0422 by Elbert Ewings, RN Outcome: Not Progressing   Problem: Fluid Volume: Goal: Ability to maintain a balanced intake and output will improve 05/26/2023 0422 by Elbert Ewings, RN Outcome: Not Progressing 05/26/2023 0422 by Elbert Ewings, RN Outcome: Not Progressing   Problem: Health Behavior/Discharge Planning: Goal: Ability to identify and utilize available resources and services will improve 05/26/2023 0422 by Elbert Ewings, RN Outcome: Not Progressing 05/26/2023 0422 by Elbert Ewings, RN Outcome: Not Progressing Goal: Ability to manage health-related needs will improve 05/26/2023 0422 by Elbert Ewings, RN Outcome: Not Progressing 05/26/2023 0422 by Elbert Ewings, RN Outcome: Not Progressing   Problem: Metabolic: Goal: Ability to maintain appropriate glucose levels will improve 05/26/2023 0422 by Elbert Ewings, RN Outcome: Not Progressing 05/26/2023 0422 by Elbert Ewings, RN Outcome: Not Progressing   Problem: Nutritional: Goal: Maintenance of adequate nutrition will improve 05/26/2023 0422 by Elbert Ewings, RN Outcome: Not Progressing 05/26/2023 0422 by Elbert Ewings, RN Outcome: Not Progressing Goal: Progress toward achieving an optimal weight will improve 05/26/2023 0422 by Elbert Ewings, RN Outcome: Not  Progressing 05/26/2023 0422 by Elbert Ewings, RN Outcome: Not Progressing   Problem: Skin Integrity: Goal: Risk for impaired skin integrity will decrease 05/26/2023 0422 by Elbert Ewings, RN Outcome: Not Progressing 05/26/2023 0422 by Elbert Ewings, RN Outcome: Not Progressing   Problem: Tissue Perfusion: Goal: Adequacy of tissue perfusion will improve 05/26/2023 0422 by Elbert Ewings, RN Outcome: Not Progressing 05/26/2023 0422 by Elbert Ewings, RN Outcome: Not Progressing   Problem: Education: Goal: Knowledge of General Education information will improve Description: Including pain rating scale, medication(s)/side effects and non-pharmacologic comfort measures 05/26/2023 0422 by Elbert Ewings, RN Outcome: Not Progressing 05/26/2023 0422 by Elbert Ewings, RN Outcome: Not Progressing   Problem: Health Behavior/Discharge Planning: Goal: Ability to manage health-related needs will improve 05/26/2023 0422 by Elbert Ewings, RN Outcome: Not Progressing 05/26/2023 0422 by Elbert Ewings, RN Outcome: Not Progressing   Problem: Clinical Measurements: Goal: Ability to maintain clinical measurements within normal limits will improve 05/26/2023 0422 by Elbert Ewings, RN Outcome: Not Progressing 05/26/2023 0422 by Elbert Ewings, RN Outcome: Not Progressing Goal: Will remain free from infection 05/26/2023 0422 by Elbert Ewings, RN Outcome: Not Progressing 05/26/2023 0422 by Elbert Ewings, RN Outcome: Not Progressing Goal: Diagnostic test results will improve 05/26/2023 0422 by Elbert Ewings, RN Outcome: Not Progressing 05/26/2023 0422 by Elbert Ewings, RN Outcome: Not Progressing Goal: Respiratory complications will improve 05/26/2023 0422 by Elbert Ewings, RN Outcome: Not Progressing 05/26/2023 0422 by Elbert Ewings, RN Outcome: Not Progressing Goal: Cardiovascular complication will be avoided 05/26/2023 0422 by Elbert Ewings, RN Outcome:  Not Progressing 05/26/2023 0422 by Elbert Ewings, RN Outcome: Not Progressing   Problem: Activity: Goal: Risk for activity intolerance will decrease 05/26/2023 0422 by Elbert Ewings, RN Outcome: Not Progressing 05/26/2023  0422 by Elbert Ewings, RN Outcome: Not Progressing   Problem: Nutrition: Goal: Adequate nutrition will be maintained 05/26/2023 0422 by Elbert Ewings, RN Outcome: Not Progressing 05/26/2023 0422 by Elbert Ewings, RN Outcome: Not Progressing   Problem: Coping: Goal: Level of anxiety will decrease 05/26/2023 0422 by Elbert Ewings, RN Outcome: Not Progressing 05/26/2023 0422 by Elbert Ewings, RN Outcome: Not Progressing   Problem: Elimination: Goal: Will not experience complications related to bowel motility Outcome: Not Progressing Goal: Will not experience complications related to urinary retention Outcome: Not Progressing   Problem: Pain Managment: Goal: General experience of comfort will improve and/or be controlled Outcome: Not Progressing   Problem: Safety: Goal: Ability to remain free from injury will improve Outcome: Not Progressing   Problem: Skin Integrity: Goal: Risk for impaired skin integrity will decrease Outcome: Not Progressing

## 2023-05-26 NOTE — Progress Notes (Addendum)
 PROGRESS NOTE  [name]  ZOX:[id number] DOB: 1965-11-15 DOA: 05/24/2023 PCP: Joycelyn Rua, MD  Consultants  Brief Narrative: 58 y.o. 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  Patient has been seen oncologist on chemotherapy with the last infusion on February 21 2 days ago.  Patient is followed by Dr. [name] coming today to the ER with inability to walk and severe low back pain.  Patient also has increased fatigue.  Admitted for weakness.  Oncology is seen.  Neurology consulted due to weakness and there was concern for proximal weakness leading to myopathy.  Because of this MRI of brain and cervical spine were ordered.  Question of mets to brain based on MRI.  Also cancer noted throughout cervical vertebrae.  Neurology requested muscle biopsy for better diagnosis of proximal weakness and therefore surgery consulted today 2/25.   Assessment & Plan: bilateral lower extremity weakness:  -Persist. -Greatly appreciate neurology and oncology input. -Seems to be more proximal weakness. -Question of steroid-induced myopathy?  We have consulted surgery for possible muscle biopsy -Neurology also recommending outpatient EMG plus LP.  Holding patient's [facility] - Appreciate surgery input for possible muscle bx  intractable lower back pain:  Secondary to metastatic disease.  Continue IV with oral pain medications.   - APAP plus oral and IV diluadid - Dexamethasone as per oncology  -Continue current pain regimen.  Patient reports today that his pain is fairly well under control.   metastatic colon cancer:  Patient on chemotherapy and radiation therapy.  Oncology consult   diabetes type 2:  Initiate sliding scale insulin.  Also long-acting insulin.   - long-acting held on admit, CBGs uptrending - restarting  lantus at half home dose, low threshold to increase to full home dose pending CBGs here.   - Carb mod diet   hyperlipidemia:  Continue with statin   essential hypertension:  - home regimen held until med rec done - BPs have been creeping upwards - restarting home regimen  Hx/o PE: -Holding this for muscle biopsy/LP. -Will start heparin per pharmacy - VTE Sept/2023, less than 6 months ago.  High risk, thus heparin drip -- but would appreciate oncology input as to whether he needs to be on heparin drip or can just daily heparin TID.    hyponatremia:  Sodium was 133 today.  Continue monitoring.  Hydrate and monitor sodium level. In range since August 2024 not cause of sudden LE weakness   increased LFTs:  - elevated alk phos most liikely secondary to colon cancer with bony mets.  Continue to monitor LFTs   moderate severe malnutrition:  Nutrition consult.    Macrocytic anemia: - longstanding as well, although Hgb 9.4 a little lower than has been - no history/evidence of bleed - Onc on board.  Will follow  Thrombocytopenia: -Platelets have come up somewhat today. -Will continue to trend.  Secondary to cancer/chemotherapy.     DVT prophylaxis:   dabigatran (PRADAXA) capsule 150 mg  Code Status:   Code Status: Full Code Level of care: Med-Surg Status is: Inpatient Dispo: pending workup for weakness   Consults called: Oncology, neurology   Subjective: Patient feels "ok" on my exam.  Reports he can rally and talk with staff, then energy gives out afterwards.  Painful when moving legs even when not standing.  Objective: Vitals:   05/25/23 2131 05/26/23 0324 05/26/23 0642 05/26/23 1321  BP: (!) 139/90 (!) 141/82 138/78 128/88  Pulse: 93 78 87 82  Resp: 20 20 20 18   Temp: 98 F (36.7 C) 98.2 F (36.8 C) 98.7 F (37.1 C) 98.1 F (36.7 C)  TempSrc: Oral Oral Oral Oral  SpO2: 97% 98% 98% 97%  Weight:      Height:        Intake/Output Summary (Last 24 hours) at  05/26/2023 1734 Last data filed at 05/26/2023 1348 Gross per 24 hour  Intake 480 ml  Output --  Net 480 ml   Filed Weights   05/24/23 1734  Weight: 84.8 kg   Body mass index is 26.08 kg/m.  Gen: 58 y.o. male in no apparent distress.  IN pain with movement  Pulm: Non-labored breathing.  Clear to auscultation bilaterally.  CV: Regular rate and rhythm. No murmur, rub, or gallop. No JVD GI: Abdomen soft, non-tender, non-distended Ext: Warm, no deformities,  Skin: No rashes, lesions  Neuro: Alert and oriented. Strength 3/5 BL LE's appears to be limited by pain though difficult to discern pain vs weakness.  Sensation intact BL  Psych: Calm  Judgement and insight appear normal. Mood & affect appropriate.     I have personally reviewed the following labs and images: CBC: Recent Labs  Lab 05/20/23 0855 05/22/23 0828 05/24/23 1943 05/25/23 0541 05/26/23 0450  WBC 9.6 11.3* 11.7* 9.6 9.6  NEUTROABS 6.8 8.4* 8.8*  --   --   HGB 10.6* 10.1* 9.1* 9.4* 8.8*  HCT 32.3* 30.0* 28.9* 29.7* 26.9*  MCV 99.1 98.7 103.2* 103.1* 101.5*  PLT 51* 53* PLATELET CLUMPS NOTED ON SMEAR, UNABLE TO ESTIMATE 72* 79*   BMP &GFR Recent Labs  Lab 05/20/23 0855 05/24/23 1912 05/25/23 0115 05/25/23 0541 05/26/23 0450  NA 132* 132*  --  132* 133*  K 3.9 4.0  --  4.5 4.8  CL 97* 104  --  98 99  CO2 24 19*  --  25 24  GLUCOSE 305* 177*  --  123* 186*  BUN 22* 25*  --  21* 18  CREATININE 0.88 0.48* 0.57* 0.67 0.57*  CALCIUM 8.2* 7.1*  --  8.1* 8.3*  MG  --  1.4*  --   --   --    Estimated Creatinine Clearance: 111.1 mL/min (A) (by C-G formula based on SCr of 0.57 mg/dL (L)). Liver & Pancreas: Recent Labs  Lab 05/20/23 0855 05/24/23 1912 05/25/23 0541 05/26/23 0450  AST 18 17 21 18   ALT 7 9 9 9   ALKPHOS 1,132* 681* 794* 695*  BILITOT 0.6 0.5 0.6 0.8  PROT 6.4* 5.0* 6.1* 6.0*  ALBUMIN 3.4* 2.2* 2.4* 2.4*   No results for input(s): "LIPASE", "AMYLASE" in the last 168 hours. No results for  input(s): "AMMONIA" in the last 168 hours. Diabetic: Recent Labs    05/25/23 0540  HGBA1C 9.5*   Recent Labs  Lab 05/25/23 2127 05/25/23 2247 05/26/23 0725 05/26/23 1127 05/26/23 1602  GLUCAP 226* 244* 173* 197* 278*   Cardiac Enzymes: Recent Labs  Lab 05/25/23 1833  CKTOTAL 188   No results for input(s): "PROBNP" in the last 8760 hours. Coagulation Profile: No results for input(s): "INR", "PROTIME" in the last 168 hours. Thyroid Function Tests: Recent Labs    05/26/23 0450  TSH 0.638   Lipid Profile: No results for input(s): "CHOL", "HDL", "LDLCALC", "TRIG", "CHOLHDL", "LDLDIRECT" in the last 72 hours. Anemia Panel: No  results for input(s): "VITAMINB12", "FOLATE", "FERRITIN", "TIBC", "IRON", "RETICCTPCT" in the last 72 hours. Urine analysis:    Component Value Date/Time   COLORURINE YELLOW 07/22/2022 0822   APPEARANCEUR CLEAR 07/22/2022 0822   LABSPEC 1.026 07/22/2022 0822   PHURINE 6.0 07/22/2022 0822   GLUCOSEU NEGATIVE 07/22/2022 0822   HGBUR NEGATIVE 07/22/2022 0822   BILIRUBINUR NEGATIVE 07/22/2022 0822   KETONESUR NEGATIVE 07/22/2022 0822   PROTEINUR 100 (A) 05/20/2023 0855   UROBILINOGEN 0.2 08/17/2014 1831   NITRITE NEGATIVE 07/22/2022 0822   LEUKOCYTESUR NEGATIVE 07/22/2022 0822   Sepsis Labs: Invalid input(s): "PROCALCITONIN", "LACTICIDVEN"  Microbiology: Recent Results (from the past 240 hours)  Resp panel by RT-PCR (RSV, Flu A&B, Covid) Anterior Nasal Swab     Status: None   Collection Time: 05/24/23  5:44 PM   Specimen: Anterior Nasal Swab  Result Value Ref Range Status   SARS Coronavirus 2 by RT PCR NEGATIVE NEGATIVE Final    Comment: (NOTE) SARS-CoV-2 target nucleic acids are NOT DETECTED.  The SARS-CoV-2 RNA is generally detectable in upper respiratory specimens during the acute phase of infection. The lowest concentration of SARS-CoV-2 viral copies this assay can detect is 138 copies/mL. A negative result does not preclude  SARS-Cov-2 infection and should not be used as the sole basis for treatment or other patient management decisions. A negative result may occur with  improper specimen collection/handling, submission of specimen other than nasopharyngeal swab, presence of viral mutation(s) within the areas targeted by this assay, and inadequate number of viral copies(<138 copies/mL). A negative result must be combined with clinical observations, patient history, and epidemiological information. The expected result is Negative.  Fact Sheet for Patients:  BloggerCourse.com  Fact Sheet for Healthcare Providers:  SeriousBroker.it  This test is no t yet approved or cleared by the [place] FDA and  has been authorized for detection and/or diagnosis of SARS-CoV-2 by FDA under an Emergency Use Authorization (EUA). This EUA will remain  in effect (meaning this test can be used) for the duration of the COVID-19 declaration under Section 564(b)(1) of the Act, 21 U.S.C.section 360bbb-3(b)(1), unless the authorization is terminated  or revoked sooner.       Influenza A by PCR NEGATIVE NEGATIVE Final   Influenza B by PCR NEGATIVE NEGATIVE Final    Comment: (NOTE) The Xpert Xpress SARS-CoV-2/FLU/RSV plus assay is intended as an aid in the diagnosis of influenza from Nasopharyngeal swab specimens and should not be used as a sole basis for treatment. Nasal washings and aspirates are unacceptable for Xpert Xpress SARS-CoV-2/FLU/RSV testing.  Fact Sheet for Patients: BloggerCourse.com  Fact Sheet for Healthcare Providers: SeriousBroker.it  This test is not yet approved or cleared by the [place] FDA and has been authorized for detection and/or diagnosis of SARS-CoV-2 by FDA under an Emergency Use Authorization (EUA). This EUA will remain in effect (meaning this test can be used) for the duration of  the COVID-19 declaration under Section 564(b)(1) of the Act, 21 U.S.C. section 360bbb-3(b)(1), unless the authorization is terminated or revoked.     Resp Syncytial Virus by PCR NEGATIVE NEGATIVE Final    Comment: (NOTE) Fact Sheet for Patients: BloggerCourse.com  Fact Sheet for Healthcare Providers: SeriousBroker.it  This test is not yet approved or cleared by the [place] FDA and has been authorized for detection and/or diagnosis of SARS-CoV-2 by FDA under an Emergency Use Authorization (EUA). This EUA will remain in effect (meaning this test can be used) for the duration of the COVID-19  declaration under Section 564(b)(1) of the Act, 21 U.S.C. section 360bbb-3(b)(1), unless the authorization is terminated or revoked.  Performed at [facility], [address], [place], [place] [address]     Radiology Studies: MR CERVICAL SPINE W WO CONTRAST Result Date: 05/26/2023 CLINICAL DATA:  Initial evaluation for metastatic disease evaluation, myelopathy. EXAM: MRI CERVICAL SPINE WITHOUT AND WITH CONTRAST TECHNIQUE: Multiplanar and multiecho pulse sequences of the cervical spine, to include the craniocervical junction and cervicothoracic junction, were obtained without and with intravenous contrast. CONTRAST:  8mL GADAVIST GADOBUTROL 1 MMOL/ML IV SOLN COMPARISON:  Prior study from 02/04/2022 FINDINGS: Alignment: Straightening with mild reversal of the normal cervical lordosis. No significant listhesis. Vertebrae: Diffusely abnormal and heterogeneous appearance of the visualized bone marrow, consistent with diffuse osseous metastatic disease. There is involvement of essentially all levels including the skull base. Small amount of extraosseous/epidural extension noted within the right ventral epidural space at T1 (series 6, image 7). No other significant extra osseous or epidural extension of tumor. No pathologic compression  fracture. Cord: Normal signal and morphology. Posterior Fossa, vertebral arteries, paraspinal tissues: Unremarkable. Disc levels: C2-C3: Negative interspace. Mild left-sided facet hypertrophy. No spinal stenosis. Foramina remain patent. C3-C4: Small left paracentral disc protrusion mildly flattens and indents the left ventral thecal sac. Superimposed bilateral uncovertebral spurring with mild left-sided facet hypertrophy. No significant spinal stenosis. Mild left C4 foraminal narrowing. Right neural foramen remains patent. C4-C5: Degenerative intervertebral disc space narrowing with diffuse disc bulge and bilateral uncovertebral spurring. No spinal stenosis. Mild left C5 foraminal stenosis. Right neural foramina remains patent. C5-C6: Mild disc bulge with bilateral uncovertebral spurring. Mild left-sided facet spurring. No significant spinal stenosis. Mild left C6 foraminal narrowing. Right neural foramina remains patent. C6-C7: Degenerative disc space narrowing with diffuse disc bulge and bilateral uncovertebral spurring, slightly asymmetric to the left. No significant spinal stenosis. Foramina remain patent. C7-T1: Degenerate intervertebral disc space narrowing with diffuse disc bulge and bilateral uncovertebral spurring. No significant spinal stenosis. Foramina remain patent. IMPRESSION: 1. Diffuse osseous metastatic disease involving the cervical spine. Small amount of extraosseous/epidural extension of tumor within the right ventral epidural space at T1 without stenosis. No other significant extra osseous or epidural extension of tumor. No pathologic compression fracture. 2. Underlying mild multilevel cervical spondylosis without significant spinal stenosis. Mild left C4 through C6 foraminal narrowing. Electronically Signed   By: Rise Mu M.D.   On: 05/26/2023 06:42   MR BRAIN W WO CONTRAST Result Date: 05/26/2023 CLINICAL DATA:  Initial evaluation for metastatic disease evaluation. EXAM: MRI  HEAD WITHOUT AND WITH CONTRAST TECHNIQUE: Multiplanar, multiecho pulse sequences of the brain and surrounding structures were obtained without and with intravenous contrast. CONTRAST:  8mL GADAVIST GADOBUTROL 1 MMOL/ML IV SOLN COMPARISON:  Comparison made with prior MRI from 03/01/2014. FINDINGS: Brain: Cerebral volume within normal limits. Patchy T2/FLAIR hyperintensity involving the supratentorial cerebral white matter, most consistent with chronic small vessel ischemic disease, mild in nature. No evidence for acute or subacute infarct. No acute intracranial hemorrhage. Few scattered chronic micro hemorrhages noted, most prominent of which present at the left lentiform nucleus. Punctate focus of enhancement noted at the left parieto-occipital junction (series 16, image 101). Additional punctate focus of enhancement noted within the left parietal lobe superiorly (series 16, image 116). Findings are nonspecific. No other mass lesion or abnormal enhancement. No other evidence for intra-axial metastatic disease. No mass effect or midline shift. No hydrocephalus or extra-axial fluid collection. Pituitary gland suprasellar region within normal limits. Vascular:  Major intracranial vascular flow voids are maintained. Skull and upper cervical spine: Craniocervical junction within normal limits. Heterogeneous signal intensity within the visualized bone marrow with a few probable marrow replacing lesions about the skull base and calvarium, consistent with osseous metastatic disease. No visible extra osseous extension of tumor. No scalp soft tissue abnormality. Sinuses/Orbits: Globes and orbital soft tissues within normal limits. Paranasal sinuses are largely clear. Trace left mastoid effusion noted, of doubtful significance. Other: None. IMPRESSION: 1. Two punctate foci of enhancement involving the left parieto-occipital region as above. While these findings are nonspecific, tiny metastatic lesions are suspected given  patient history. A short interval follow-up MRI could be performed for further evaluation and to evaluate for interval change as warranted. 2. Heterogeneous bone marrow signal intensity with a few marrow replacing lesions about the skull base and calvarium, consistent with osseous metastatic disease. 3. No other acute intracranial abnormality. 4. Punctate underlying mild chronic microvascular ischemic disease. Electronically Signed   By: Rise Mu M.D.   On: 05/26/2023 06:32    Scheduled Meds:  budesonide (PULMICORT) nebulizer solution  0.25 mg Nebulization BID   dabigatran  150 mg Oral Q12H   dexamethasone  4 mg Oral Daily   fentaNYL (SUBLIMAZE) injection  50 mcg Intravenous Once   insulin aspart  0-20 Units Subcutaneous TID WC   insulin aspart  0-5 Units Subcutaneous QHS   insulin glargine-yfgn  20 Units Subcutaneous QHS   lisinopril  5 mg Oral Daily   metoprolol succinate  25 mg Oral Daily   Continuous Infusions:     LOS: 2 days   55 minutes with more than 50% spent in reviewing records, counseling patient/family and coordinating care.  Tobey Grim, MD [facility] [email or web address] 05/26/2023, 5:34 PM

## 2023-05-26 NOTE — Progress Notes (Signed)
 NEUROLOGY CONSULT FOLLOW UP NOTE   Date of service: May 26, 2023 Patient Name: [name] MRN:  [id number] DOB:  May 08, 1965  Interval Hx/subjective   Patient has remained hemodynamically stable and afebrile overnight.  He reports that he was able to ambulate to the bathroom without a walker but that he still feels unsteady on his feet and usually needs a walker for stability and safety.  MRI brain revealed 2 punctate foci of enhancement in left parieto-occipital region, and MRI cervical spine did reveal diffuse osseous mets metastatic disease with extraosseous/epidural extension of tumor at T1.  Patient states that he is planning to fly to [facility] for consultation regarding his medical conditions soon.  Vitals   Vitals:   05/25/23 1832 05/25/23 2131 05/26/23 0324 05/26/23 0642  BP: 126/88 (!) 139/90 (!) 141/82 138/78  Pulse: 80 93 78 87  Resp: 20 20 20 20   Temp: 97.8 F (36.6 C) 98 F (36.7 C) 98.2 F (36.8 C) 98.7 F (37.1 C)  TempSrc: Oral Oral Oral Oral  SpO2: 99% 97% 98% 98%  Weight:      Height:         Body mass index is 26.08 kg/m.  Physical Exam   Constitutional: Appears well-developed and well-nourished.  Psych: Affect appropriate to situation.  Eyes: No scleral injection.  HENT: No OP obstrucion.  Head: Normocephalic.  Respiratory: Effort normal, non-labored breathing.  Skin: WDI.   Neurologic Examination    NEURO:  Mental Status: Alert and oriented to person place time and situation, able to give clear and coherent history of present illness Speech/Language: speech is without dysarthria or aphasia.    Cranial Nerves:  II: PERRL. Visual fields full.  III, IV, VI: EOMI. Eyelids elevate symmetrically.  V: Sensation is intact to light touch and symmetrical to face.  VII: Smile is symmetrical.  VIII: hearing intact to voice. IX, X: Phonation is normal.  VZ:DGLOVFIE shrug 5/5. XII: tongue is midline without fasciculations. Motor: 5/5 strength to  bilateral upper extremities, proximal and distal, 4/5 strength to right lower extremity at hip flexion, 4-/5 strength to left lower extremity at hip flexion  Tone: is normal and bulk is normal Sensation- Intact to light touch bilaterally with some diminished sensation over the left and calf in a circumferential distribution Coordination: FTN intact bilaterally DTRs: 2+ in bicep, 3+ knees with contralateral spread bilaterally. Absent at ankles.  Toes are downgoing bilaterally.    Medications  Current Facility-Administered Medications:    acetaminophen (TYLENOL) tablet 500 mg, 500 mg, Oral, Daily PRN, Tobey Grim, MD   albuterol (PROVENTIL) (2.5 MG/3ML) 0.083% nebulizer solution 2.5 mg, 2.5 mg, Nebulization, Q6H PRN, Tobey Grim, MD   budesonide (PULMICORT) nebulizer solution 0.25 mg, 0.25 mg, Nebulization, BID, Tobey Grim, MD, 0.25 mg at 05/26/23 3329   calcium carbonate (TUMS - dosed in mg elemental calcium) chewable tablet 200 mg of elemental calcium, 1 tablet, Oral, Daily PRN, Tobey Grim, MD, 200 mg of elemental calcium at 05/25/23 2110   dabigatran (PRADAXA) capsule 150 mg, 150 mg, Oral, Q12H, Tobey Grim, MD, 150 mg at 05/25/23 2249   dexamethasone (DECADRON) tablet 4 mg, 4 mg, Oral, Daily, Bhagat, Srishti L, MD, 4 mg at 05/26/23 0806   fentaNYL (SUBLIMAZE) injection 50 mcg, 50 mcg, Intravenous, Once, Kyere, Belinda K, NP   HYDROmorphone (DILAUDID) injection 1 mg, 1 mg, Intravenous, Q2H PRN, Earlie Lou L, MD, 1 mg at 05/25/23 2110   HYDROmorphone (DILAUDID) tablet 4-8 mg,  4-8 mg, Oral, Q4H PRN, Ladene Artist, MD, 8 mg at 05/26/23 0858   insulin aspart (novoLOG) injection 0-20 Units, 0-20 Units, Subcutaneous, TID WC, Earlie Lou L, MD, 4 Units at 05/26/23 0805   insulin aspart (novoLOG) injection 0-5 Units, 0-5 Units, Subcutaneous, QHS, Earlie Lou L, MD, 2 Units at 05/25/23 2300   insulin glargine-yfgn (SEMGLEE) injection 20 Units, 20 Units,  Subcutaneous, QHS, Tobey Grim, MD, 20 Units at 05/25/23 2248   lisinopril (ZESTRIL) tablet 5 mg, 5 mg, Oral, Daily, Tobey Grim, MD, 5 mg at 05/26/23 4098   metoprolol succinate (TOPROL-XL) 24 hr tablet 25 mg, 25 mg, Oral, Daily, Tobey Grim, MD, 25 mg at 05/26/23 0806   ondansetron (ZOFRAN) tablet 4 mg, 4 mg, Oral, Q6H PRN **OR** ondansetron (ZOFRAN) injection 4 mg, 4 mg, Intravenous, Q6H PRN, Earlie Lou L, MD, 4 mg at 05/25/23 1550  Labs and Diagnostic Imaging   CBC:  Recent Labs  Lab 05/22/23 0828 05/22/23 0828 05/24/23 1943 05/25/23 0541 05/26/23 0450  WBC 11.3*   < > 11.7* 9.6 9.6  NEUTROABS 8.4*  --  8.8*  --   --   HGB 10.1*   < > 9.1* 9.4* 8.8*  HCT 30.0*  --  28.9* 29.7* 26.9*  MCV 98.7  --  103.2* 103.1* 101.5*  PLT 53*   < > PLATELET CLUMPS NOTED ON SMEAR, UNABLE TO ESTIMATE 72* 79*   < > = values in this interval not displayed.    Basic Metabolic Panel:  Lab Results  Component Value Date   NA 133 (L) 05/26/2023   K 4.8 05/26/2023   CO2 24 05/26/2023   GLUCOSE 186 (H) 05/26/2023   BUN 18 05/26/2023   CREATININE 0.57 (L) 05/26/2023   CALCIUM 8.3 (L) 05/26/2023   GFRNONAA >60 05/26/2023   GFRAA 115 02/08/2020   Lipid Panel:  Lab Results  Component Value Date   LDLCALC 71 03/05/2014   HgbA1c:  Lab Results  Component Value Date   HGBA1C 9.5 (H) 05/25/2023   Urine Drug Screen: No results found for: "LABOPIA", "COCAINSCRNUR", "LABBENZ", "AMPHETMU", "THCU", "LABBARB"  Alcohol Level No results found for: "ETH" INR  Lab Results  Component Value Date   INR 1.1 08/13/2021   APTT  Lab Results  Component Value Date   APTT 27 08/13/2021  TSH 0.638 HIV pending Hepatitis panel pending Aldolase pending CK 188  MRI Brain with and without contrast (Personally reviewed): 2 punctate foci of enhancement involving left parietal region, possibly tiny metastatic lesions, underlying mild chronic microvascular ischemic disease  MRI cervical  spine: Diffuse osseous metastatic disease involving the cervical spine, small amount of extraosseous/epidural extension of tumor within ventral epidural space at T1 without stenosis, mild left C4-C3 6 foraminal narrowing  MRI thoracic and lumbar spine: Diffuse osseous metastatic disease throughout the thoracic and lumbar spine and sacrum, no acute fracture or spinal canal stenosis, worsened severe right and moderate left L4-L5 neuroforaminal stenosis due to combination of disc bulge and facet arthrosis  Assessment   AMIL BOUWMAN is a 58 y.o. 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  He has noted particular difficulty changing positions getting up from his bed and states that he has had to use a walker lately for safety  and stability.  He states that up until last week, he was able to work, sometimes from home and sometimes in the office but that he has had difficulty doing that over the past week.  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  MRI of the cervical spine did reveal a mild extension of tumor within ventral epidural space at T1 which correlates with patient's symptoms of shoulder pain.  Chemotherapy and steroid-induced myopathy is definitely on the differential, and findings on MRI of the cervical spine and brain do not explain patient's symptoms.   Recommendations  - Continue home dose of dexamethasone at 4 mg daily, avoid increase if possible - Continue to hold atorvastatin - PT/OT - LP for cells, protein, glucose, autoimmune panel, cytology - He will need outpatient EMG - Neurology will be available to advise and  follow-up testing. ______________________________________________________________________   Signed, Cortney Harland Dingwall, NP [facility]   I have seen the patient reviewed the above note.  He has hip flexion weakness bilaterally, left more than right.  He is slightly brisk at the knees bilaterally with absent ankle reflexes.  He is not brisk in the upper extremities.  He has downgoing toes.  With the degree of gait dysfunction that he is having from his progressive weakness, I do think further workup would be prudent.  LP to assess for carcinomatous meningitis I think would be one consideration.  EMG to to assess whether the foraminal stenosis is playing a significant role would be another consideration.  With intradural spread, I do think that carcinomatous meningitis may need to be considered as an etiology as this can present with lower extremity weakness.  Paraneoplastic myelopathy is also consideration given the brisk knee reflexes.  I would favor checking a paraneoplastic panel, and also getting a lumbar puncture for cells, protein, glucose, autoimmune panel, cytology.  I did consider recommending muscle biopsy, however especially given the increased risks associated with chemotherapy, and my suspicion that this is unlikely to significantly alter management at least in the short-term, I think is reasonable to hold off on that for now.  Neurology will be available to advise and follow-up testing.  Ritta Slot, MD [facility]   If 7pm- 7am, please page neurology on call as listed in [facility]ION.

## 2023-05-27 ENCOUNTER — Inpatient Hospital Stay (HOSPITAL_COMMUNITY): Payer: BC Managed Care – PPO

## 2023-05-27 DIAGNOSIS — R29898 Other symptoms and signs involving the musculoskeletal system: Secondary | ICD-10-CM | POA: Diagnosis not present

## 2023-05-27 LAB — CSF CELL COUNT WITH DIFFERENTIAL
RBC Count, CSF: 300 /mm3 — ABNORMAL HIGH
RBC Count, CSF: 8 /mm3 — ABNORMAL HIGH
Tube #: 1
Tube #: 4
WBC, CSF: 0 /mm3 (ref 0–5)
WBC, CSF: 1 /mm3 (ref 0–5)

## 2023-05-27 LAB — CBC
HCT: 29.3 % — ABNORMAL LOW (ref 39.0–52.0)
Hemoglobin: 9.4 g/dL — ABNORMAL LOW (ref 13.0–17.0)
MCH: 32.6 pg (ref 26.0–34.0)
MCHC: 32.1 g/dL (ref 30.0–36.0)
MCV: 101.7 fL — ABNORMAL HIGH (ref 80.0–100.0)
Platelets: 94 10*3/uL — ABNORMAL LOW (ref 150–400)
RBC: 2.88 MIL/uL — ABNORMAL LOW (ref 4.22–5.81)
RDW: 18.3 % — ABNORMAL HIGH (ref 11.5–15.5)
WBC: 8.9 10*3/uL (ref 4.0–10.5)
nRBC: 0.3 % — ABNORMAL HIGH (ref 0.0–0.2)

## 2023-05-27 LAB — MENINGITIS/ENCEPHALITIS PANEL (CSF)

## 2023-05-27 LAB — COMPREHENSIVE METABOLIC PANEL
ALT: 11 U/L (ref 0–44)
AST: 16 U/L (ref 15–41)
Albumin: 2.4 g/dL — ABNORMAL LOW (ref 3.5–5.0)
Alkaline Phosphatase: 665 U/L — ABNORMAL HIGH (ref 38–126)
Anion gap: 10 (ref 5–15)
BUN: 23 mg/dL — ABNORMAL HIGH (ref 6–20)
CO2: 25 mmol/L (ref 22–32)
Calcium: 8.2 mg/dL — ABNORMAL LOW (ref 8.9–10.3)
Chloride: 97 mmol/L — ABNORMAL LOW (ref 98–111)
Creatinine, Ser: 0.72 mg/dL (ref 0.61–1.24)
GFR, Estimated: >60 mL/min (ref >60)
Glucose, Bld: 208 mg/dL — ABNORMAL HIGH (ref 70–99)
Potassium: 4.9 mmol/L (ref 3.5–5.1)
Sodium: 132 mmol/L — ABNORMAL LOW (ref 135–145)
Total Bilirubin: 0.6 mg/dL (ref 0.0–1.2)
Total Protein: 6.2 g/dL — ABNORMAL LOW (ref 6.5–8.1)

## 2023-05-27 LAB — GLUCOSE, CAPILLARY
Glucose-Capillary: 198 mg/dL — ABNORMAL HIGH (ref 70–99)
Glucose-Capillary: 256 mg/dL — ABNORMAL HIGH (ref 70–99)
Glucose-Capillary: 252 mg/dL — ABNORMAL HIGH (ref 70–99)
Glucose-Capillary: 275 mg/dL — ABNORMAL HIGH (ref 70–99)

## 2023-05-27 LAB — PROTIME-INR
INR: 1.2 (ref 0.8–1.2)
Prothrombin Time: 15.7 s — ABNORMAL HIGH (ref 11.4–15.2)

## 2023-05-27 LAB — PROTEIN AND GLUCOSE, CSF
Glucose, CSF: 141 mg/dL — ABNORMAL HIGH (ref 40–70)
Total  Protein, CSF: 64 mg/dL — ABNORMAL HIGH (ref 15–45)

## 2023-05-27 LAB — HEPARIN LEVEL (UNFRACTIONATED): Heparin Unfractionated: 0.12 [IU]/mL — ABNORMAL LOW (ref 0.30–0.70)

## 2023-05-27 LAB — ALDOLASE: Aldolase: 7.8 U/L (ref 3.3–10.3)

## 2023-05-27 MED ORDER — SODIUM CHLORIDE 0.9 % IV SOLN: INTRAVENOUS | Status: DC

## 2023-05-27 MED ORDER — HEPARIN (PORCINE) 25000 UT/250ML-% IV SOLN
1800.0000 [IU]/h | INTRAVENOUS | Status: DC
  Administered 2023-05-27: 1600 [IU]/h via INTRAVENOUS
  Filled 2023-05-27: qty 250

## 2023-05-27 MED ORDER — HEPARIN BOLUS VIA INFUSION
2000.0000 [IU] | INTRAVENOUS | Status: AC
  Administered 2023-05-27: 2000 [IU] via INTRAVENOUS
  Filled 2023-05-27: qty 2000

## 2023-05-27 NOTE — Progress Notes (Signed)
   05/27/23 1204  TOC Brief Assessment  Insurance and Status Reviewed  Patient has primary care physician Yes  Home environment has been reviewed [address]   [place] [id number]  Prior level of function: independent  Social Drivers of Health Review SDOH reviewed no interventions necessary  Readmission risk has been reviewed Yes  Transition of care needs no transition of care needs at this time

## 2023-05-27 NOTE — Progress Notes (Signed)
 IP PROGRESS NOTE  Subjective:   Mr. Edgell reports improved pain control with Dilaudid.  He continues to have leg weakness.  He has right upper arm pain and weakness at the right bicep.  He is ambulating.  Objective: Vital signs in last 24 hours: Blood pressure (!) 130/91, pulse 83, temperature 98.3 F (36.8 C), temperature source Oral, resp. rate 16, height 5\' 11"  (1.803 m), weight 187 lb (84.8 kg), SpO2 96%.  Intake/Output from previous day: 02/25 0701 - 02/26 0700 In: 525.7 [P.O.:480; I.V.:45.7] Out: -   Physical Exam:  HEENT: No thrush or ulcers  Abdomen: Nontender, no hepatosplenomegaly Extremities: No leg edema Neurologic: Alert and oriented, 4+/5 strength of the right bicep, decreased strength at the left greater than right leg with hip flexion/knee extension, 3-4/5 strength with flexion at the left hip, 4+/5 strength with plantarflexion of the left foot  Portacath/PICC-without erythema  Lab Results: Recent Labs    05/26/23 0450 05/27/23 0612  WBC 9.6 8.9  HGB 8.8* 9.4*  HCT 26.9* 29.3*  PLT 79* 94*    BMET Recent Labs    05/26/23 0450 05/27/23 0612  NA 133* 132*  K 4.8 4.9  CL 99 97*  CO2 24 25  GLUCOSE 186* 208*  BUN 18 23*  CREATININE 0.57* 0.72  CALCIUM 8.3* 8.2*    Lab Results  Component Value Date   CEA1 2.00 07/13/2020   CEA 38.61 (H) 05/20/2023    Studies/Results: MR CERVICAL SPINE W WO CONTRAST Result Date: 05/26/2023 CLINICAL DATA:  Initial evaluation for metastatic disease evaluation, myelopathy. EXAM: MRI CERVICAL SPINE WITHOUT AND WITH CONTRAST TECHNIQUE: Multiplanar and multiecho pulse sequences of the cervical spine, to include the craniocervical junction and cervicothoracic junction, were obtained without and with intravenous contrast. CONTRAST:  8mL GADAVIST GADOBUTROL 1 MMOL/ML IV SOLN COMPARISON:  Prior study from 02/04/2022 FINDINGS: Alignment: Straightening with mild reversal of the normal cervical lordosis. No significant  listhesis. Vertebrae: Diffusely abnormal and heterogeneous appearance of the visualized bone marrow, consistent with diffuse osseous metastatic disease. There is involvement of essentially all levels including the skull base. Small amount of extraosseous/epidural extension noted within the right ventral epidural space at T1 (series 6, image 7). No other significant extra osseous or epidural extension of tumor. No pathologic compression fracture. Cord: Normal signal and morphology. Posterior Fossa, vertebral arteries, paraspinal tissues: Unremarkable. Disc levels: C2-C3: Negative interspace. Mild left-sided facet hypertrophy. No spinal stenosis. Foramina remain patent. C3-C4: Small left paracentral disc protrusion mildly flattens and indents the left ventral thecal sac. Superimposed bilateral uncovertebral spurring with mild left-sided facet hypertrophy. No significant spinal stenosis. Mild left C4 foraminal narrowing. Right neural foramen remains patent. C4-C5: Degenerative intervertebral disc space narrowing with diffuse disc bulge and bilateral uncovertebral spurring. No spinal stenosis. Mild left C5 foraminal stenosis. Right neural foramina remains patent. C5-C6: Mild disc bulge with bilateral uncovertebral spurring. Mild left-sided facet spurring. No significant spinal stenosis. Mild left C6 foraminal narrowing. Right neural foramina remains patent. C6-C7: Degenerative disc space narrowing with diffuse disc bulge and bilateral uncovertebral spurring, slightly asymmetric to the left. No significant spinal stenosis. Foramina remain patent. C7-T1: Degenerate intervertebral disc space narrowing with diffuse disc bulge and bilateral uncovertebral spurring. No significant spinal stenosis. Foramina remain patent. IMPRESSION: 1. Diffuse osseous metastatic disease involving the cervical spine. Small amount of extraosseous/epidural extension of tumor within the right ventral epidural space at T1 without stenosis. No other  significant extra osseous or epidural extension of tumor. No pathologic compression fracture. 2. Underlying  mild multilevel cervical spondylosis without significant spinal stenosis. Mild left C4 through C6 foraminal narrowing. Electronically Signed   By: Rise Mu M.D.   On: 05/26/2023 06:42   MR BRAIN W WO CONTRAST Result Date: 05/26/2023 CLINICAL DATA:  Initial evaluation for metastatic disease evaluation. EXAM: MRI HEAD WITHOUT AND WITH CONTRAST TECHNIQUE: Multiplanar, multiecho pulse sequences of the brain and surrounding structures were obtained without and with intravenous contrast. CONTRAST:  8mL GADAVIST GADOBUTROL 1 MMOL/ML IV SOLN COMPARISON:  Comparison made with prior MRI from 03/01/2014. FINDINGS: Brain: Cerebral volume within normal limits. Patchy T2/FLAIR hyperintensity involving the supratentorial cerebral white matter, most consistent with chronic small vessel ischemic disease, mild in nature. No evidence for acute or subacute infarct. No acute intracranial hemorrhage. Few scattered chronic micro hemorrhages noted, most prominent of which present at the left lentiform nucleus. Punctate focus of enhancement noted at the left parieto-occipital junction (series 16, image 101). Additional punctate focus of enhancement noted within the left parietal lobe superiorly (series 16, image 116). Findings are nonspecific. No other mass lesion or abnormal enhancement. No other evidence for intra-axial metastatic disease. No mass effect or midline shift. No hydrocephalus or extra-axial fluid collection. Pituitary gland suprasellar region within normal limits. Vascular: Major intracranial vascular flow voids are maintained. Skull and upper cervical spine: Craniocervical junction within normal limits. Heterogeneous signal intensity within the visualized bone marrow with a few probable marrow replacing lesions about the skull base and calvarium, consistent with osseous metastatic disease. No visible  extra osseous extension of tumor. No scalp soft tissue abnormality. Sinuses/Orbits: Globes and orbital soft tissues within normal limits. Paranasal sinuses are largely clear. Trace left mastoid effusion noted, of doubtful significance. Other: None. IMPRESSION: 1. Two punctate foci of enhancement involving the left parieto-occipital region as above. While these findings are nonspecific, tiny metastatic lesions are suspected given patient history. A short interval follow-up MRI could be performed for further evaluation and to evaluate for interval change as warranted. 2. Heterogeneous bone marrow signal intensity with a few marrow replacing lesions about the skull base and calvarium, consistent with osseous metastatic disease. 3. No other acute intracranial abnormality. 4. Punctate underlying mild chronic microvascular ischemic disease. Electronically Signed   By: Rise Mu M.D.   On: 05/26/2023 06:32    Medications: I have reviewed the patient's current medications.  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, status post a left colectomy 03/04/2019 Mass felt to be in the transverse colon on a colonoscopy 01/07/2019 with a biopsy confirming poorly differentiated adenocarcinoma with signet ring cell features, mismatch repair protein expression normal CT abdomen/pelvis 01/08/2019 12.3 cm indeterminate splenic mass, present in 2015 on a lumbar MRI-felt to be benign, small subpleural and pleural-based nodules at the lung bases, status post left nephrectomy CT chest 01/20/2019-small bilateral pulmonary nodules with rounded parenchymal nodules in the right lower lobe, indeterminate splenic mass CT chest 03/18/2019-multiple subcentimeter pulmonary nodules again seen bilaterally, greatest in the lower lobes, without significant change.  No new or enlarging pulmonary nodules or masses.   Partially visualized large heterogeneous splenic mass with no significant change.  Plan for follow-up chest CT at a 67-month interval. Cycle 1 FOLFOX 03/21/2019 Cycle 2 FOLFOX 04/04/2019 Cycle 3 FOLFOX 04/18/2019 (oxaliplatin dose reduced secondary to thrombocytopenia) Cycle 4 FOLFOX 05/02/2019 (oxaliplatin held secondary to thrombocytopenia) Cycle 5 FOLFOX 05/16/2019 Cycle 6 FOLFOX 05/30/2019 (oxaliplatin held secondary to thrombocytopenia) CT chest 06/08/2019-stable scattered small solid pulmonary nodules  Cycle 7 FOLFOX 06/13/2019 Cycle 8 FOLFOX 06/27/2019 Cycle 9 FOLFOX 07/11/2019 Cycle 10 FOLFOX 07/25/2019 (oxaliplatin held due to thrombocytopenia) Cycle 11 FOLFOX 08/08/2019 Cycle 12 FOLFOX 08/22/2019 CTs 01/09/2020-no evidence of recurrent disease, stable lung nodules favored to represent subpleural lymph nodes-dedicated follow-up not recommended CTs 01/11/2021-no evidence of recurrent disease CTs 01/24/2022-no evidence of recurrent disease CT lumbar spine, abdomen/pelvis 04/11/2022-numerous mixed lytic and sclerotic lesions scattered throughout the visualized axial and appendicular skeleton including several areas in the lumbar spine.   04/21/2022 CT biopsy sclerotic lesion right anterior iliac bone-consistent with metastatic colorectal adenocarcinoma, moderate to poorly differentiated.  Foundation 1-microsatellite stable, tumor mutation burden 4, K-ras G12S PET scan 05/09/2022-widespread hypermetabolic mixed faintly lytic and sclerotic osseous metastases throughout the axial and proximal appendicular skeleton; associated mild pathologic compression fracture at L3; no hypermetabolic extraosseous metastatic disease. Cycle 1 FOLFIRI/bevacizumab 05/26/2022 Cycle 2 FOLFIRI/bevacizumab 06/09/2022 Cycle 3 FOLFIRI 06/24/2022, bevacizumab 06/26/2022 Cycle 4 FOLFIRI 07/08/2022, bevacizumab held due to proteinuria Cycle 5 FOLFIRI 07/22/2022, bevacizumab held due to proteinuria CTs 07/31/2022-potentially areas of sclerosis  represent interval healing; new lytic lesion at T12, multiple new areas of punched-out sclerosis throughout the visualized osseous structures.  Index lesion in the left iliac wing measures 1.3 x 2.8 cm.  Healing of previously metabolically occult metastatic disease on 05/09/2022 is a possibility. Cycle 6 FOLFIRI 08/04/2022, irinotecan dose reduced due to thrombocytopenia, bevacizumab held pending 24-hour urine result Cycle 7 FOLFIRI 08/18/2022, bevacizumab held secondary to proteinuria, G-CSF held Cycle 8 FOLFIRI 09/01/2022, bevacizumab held, G-CSF Cycle 9 FOLFIRI 09/16/2022, bevacizumab held, G-CSF CTs 09/29/2022-enlargement of a hypodense lesion posterior left lobe of the liver, similar diffuse mixed lytic and sclerotic osseous metastatic disease, multiple new small nodules medial right lung base, multiple other small bilateral pulmonary nodules unchanged. Cycle 10 FOLFIRI plus Avastin 10/08/2022 Cycle 11 FOLFIRI 10/27/2022, Avastin held Cycle 12 FOLFIRI/Avastin 11/11/2022 Cycle 13 FOLFIRI/Avastin 11/25/2022 CTs 12/08/2022-stable lesion posterior left lobe of the liver.  Unchanged diffuse lytic and sclerotic osseous metastatic disease.  Stable small solid pulmonary nodules.  Stable splenic lesions.  Slightly increased bladder wall thickening. Cycle 14 FOLFIRI/Avastin 12/10/2022 Cycle 15 FOLFIRI/Avastin 12/24/2022 12/31/2022 24-hour urine protein 710 mg Cycle 16 FOLFIRI/Avastin 01/07/2023 Cycle 17 FOLFIRI 01/21/2023, Avastin held secondary to persistent proteinuria, slight increase in creatinine, and drainage from the suprapubic lesion CTs 01/26/2023-unchanged widespread sclerotic osseous metastatic disease, unchanged hypodense liver lesions, occasional small bilateral lung nodules unchanged. Cycle 18 FOLFIRI 02/03/2023, Avastin held Cycle 19 FOLFIRI 02/17/2023, Avastin held Cycle 20 FOLFIRI 03/03/2023, Avastin held Cycle 21 FOLFIRI 03/16/2023, Avastin held CTs 04/04/2023: Right increased sclerosis of osseous  metastatic disease with new sclerotic lesions of the sternum/manubrium and increase sclerotic metastases throughout the pelvis, unchanged hypodense liver lesions Cycle 22 FOLFIRI 04/07/2023, Avastin held Cycle 23 FOLFIRI/Avastin 04/22/2023 Cycle 24 FOLFIRI/Avastin 05/06/2023   Multiple colon polyps-ascending colon polyps on the colonoscopy 01/07/2019-not removed Colonoscopy 01/03/2020-multiple polyps removed, tubular adenomas, inflammatory and hyperplastic polyps Wilms tumor at age 59, 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  Left nephrectomy at age 69 12.  Port-A-Cath placement, Dr. Michaell Cowing, 03/16/2019 13.  Thrombocytopenia secondary to chemotherapy-oxaliplatin dose reduced beginning with cycle 3 FOLFOX, improved 14.  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  Right groin soft tissue infection May 2023-status postsurgical debridement 18.  Left leg DVT-common femoral, femoral, popliteal, posterior tibial, and peroneal veins 19.  02/04/2022-MRI cervical spine-1.7 cm T1 lesion-indeterminate; bone scan 03/14/2022-focal intense activity at approximate T1 level felt to correspond to the lesion on MRI, additional foci of activity involving the lower thoracic and lumbar spine, right iliac bone and pubic symphysis.  CT lumbar spine, abdomen/pelvis 04/11/2022-numerous mixed lytic and sclerotic lesions scattered throughout the visualized axial and appendicular skeleton including several areas in the lumbar spine.  04/21/2022 CT biopsy sclerotic lesion right anterior iliac bone-consistent  with metastatic colorectal adenocarcinoma, moderate to poorly differentiated 20.  Inflamed sebaceous cyst 08/18/2022-doxycycline Surgical resection of area of "hidradenitis " 03/19/2023 21.  Admission 05/24/2023 with increased back pain and new onset leg weakness MRI thoracic and lumbar spine 05/24/2023: Fuhs osseous metastatic disease, no acute compression fracture, normal cord signal but no spinal canal stenosis, no abnormal epidural soft tissue MRI cervical spine 05/25/2023: Diffuse osseous metastatic disease, small amount of extraosseous/epidural tumor at the right ventral epidural space at T1 without stenosis, no pathologic compression fracture MRI brain 05/25/2023: 2 punctate foci of enhancement in the left parieto-occipital region-tiny metastases suspected, osseous metastatic disease     [name] has metastatic colon cancer with extensive bone metastases.  He is admitted with increased pain and new onset leg weakness.  MRIs of the spine and brain reveal no clear explanation for the neurologic symptoms.  Pain is secondary to extensive bone metastases.  I doubt the leg weakness is related to steroids (he started Decadron 05/06/2023), but steroid or chemotherapy induced myopathy are possible.  The asymmetry of his symptoms also argues against treatment related myopathy.  I appreciate the thorough evaluation by neurology.  I agree with a lumbar puncture to look for evidence of carcinomatous meningitis.   [name] is scheduled for a consultation at [facility] next week.  He hopes to attend this appointment.  Recommendations: Continue Dilaudid for pain Out of bed, ambulate as tolerated Dr. Kathrynn Running will see him today Hold Pradaxa, LP when off of Pradaxa   LOS: 3 days   Thornton Papas, MD   05/27/2023, 8:06 AM

## 2023-05-27 NOTE — Progress Notes (Addendum)
 PHARMACY - ANTICOAGULATION CONSULT NOTE  Pharmacy Consult for heparin Indication: history DVT  Allergies  Allergen Reactions   Cat Dander Anaphylaxis   Empagliflozin Other (See Comments)    Recurrent UTI    Patient Measurements: Height: 5\' 11"  (180.3 cm) Weight: 84.8 kg (187 lb) IBW/kg (Calculated) : 75.3 Heparin Dosing Weight: 84.8 kg  Vital Signs: Temp: 98.3 F (36.8 C) (02/26 0547) Temp Source: Oral (02/26 0547) BP: 130/91 (02/26 0547) Pulse Rate: 83 (02/26 0547)  Labs: Recent Labs    05/24/23 1912 05/24/23 1943 05/25/23 0541 05/25/23 1833 05/26/23 0450 05/27/23 0612 05/27/23 1055  HGB  --    < > 9.4*  --  8.8* 9.4*  --   HCT  --    < > 29.7*  --  26.9* 29.3*  --   PLT  --    < > 72*  --  79* 94*  --   LABPROT  --   --   --   --   --   --  15.7*  INR  --   --   --   --   --   --  1.2  HEPARINUNFRC  --   --   --   --   --  0.12*  --   CREATININE 0.48*   < > 0.67  --  0.57* 0.72  --   CKTOTAL  --   --   --  188  --   --   --   TROPONINIHS 6  --   --   --   --   --   --    < > = values in this interval not displayed.    Estimated Creatinine Clearance: 111.1 mL/min (by C-G formula based on SCr of 0.72 mg/dL).   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   History of hiatal hernia    Hyperlipidemia    Hypertension    Diagnostic exercise tolerance test assessment:04/30/2010 : comments normal -no evidence os ischemia by ST analysis   lipoma    Lipoma 01/26/2019   Mitral  regurgitation    Echo 12/2018: EF 60-65, myxomatous mitral valve, severe mitral regurgitation, partial flail leaflet of posterior mitral valve, myxomatous tricuspid valve with trivial TR, ascending aorta mildly dilated (39 mm)   Pneumonia    as child   PONV (postoperative nausea and vomiting)    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 Pending possible muscle biopsy. Pharmacy consulted for heparin management. Discussed with [facility] - given relatively high risk  with active cancer, will switch to heparin infusion rather than VTE prophylaxis.  Hgb and plt low but stable. Last dose of Pradaxa 150 mg 2/25 at 1200.  Today, 05/27/23 06:12 heparin level sub-therapeutic at 0.12 with heparin infusing at 1400 units/hr No bleeding or complications of therapy per nurse  Goal of Therapy:  Heparin level 0.3-0.7 units/ml Monitor platelets by anticoagulation protocol: Yes   Plan:  -2000 units IV heparin bolus via infusion then increase IV Heparin infusion to 1600 units/hr -Check 6 hour heparin level following rate increase -Daily CBC -Heparin to be held prior to procedure at discretion of provider - please contact pharmacy or place orders for heparin stop time as indicated  Lynden Ang, PharmD, BCPS Clinical Pharmacist 05/27/2023 1:35 PM  Addendum: Heparin stopped at 1152 this morning for procedure.   May be resumed in 6 hours following LP per Radiology  Plan: Resume IV heparin at 1600 units/hour at 2045 6 hour Heparin level 2/27 0300 Monitor daily heparin level, CBC, signs/symptoms of bleeding  Thank you for allowing pharmacy to be a  part of this patient's care.  Selinda Eon, PharmD, BCPS Clinical Pharmacist [facility] 05/27/2023 3:17 PM

## 2023-05-27 NOTE — Procedures (Signed)
 PROCEDURE SUMMARY:  Successful fluoroscopic guided lumbar puncture L3-4 level via 20 gauge needle ; 13 cc clear,colorless CSF obtained and sent for preordered labs No immediate complications.  Pt tolerated well.   EBL = none  Please see full dictation in imaging section of Epic for procedure details.

## 2023-05-27 NOTE — Progress Notes (Addendum)
 Patient is back from lumbar puncture. Heparin gtt to resume in 6 hours at 2045 if stable per Dr. Milford Cage; additional orders under nursing care. Next heparin level lab draw scheduled for tomorrow at 0300am.

## 2023-05-27 NOTE — Progress Notes (Signed)
 Offered supplies for pt to have a bath. Pt stated "I actually got washed up last night, but thank you".  Documented pt refusal in chart.

## 2023-05-27 NOTE — Plan of Care (Signed)

## 2023-05-27 NOTE — Progress Notes (Signed)
 PROGRESS NOTE  [name]  ZOX:[id number] DOB: 10/09/1965 DOA: 05/24/2023 PCP: Joycelyn Rua, MD  Consultants  Brief Narrative: 58 y.o. 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  Patient has been seen oncologist on chemotherapy with the last infusion on February 21. Patient is followed by Dr. [name]. Neurology consulted due to weakness and there was concern for proximal weakness leading to myopathy.  Because of this MRI of brain and cervical spine were ordered.  Question of mets to brain based on MRI.  Also cancer noted throughout cervical vertebrae.  Neurology requested LP by IR which was done on 05/27/23.    Today, patient reported being in pain compared to yesterday.  Reports a constant 7/10 pain.  Notes pain is chronic but appears to be worse with weakness worse on the left lower extremity.   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, neurology holding off muscle biopsy, recommending LP IR LP done on 2/26, labs sent out MRI brain, cervical, thoracic, lumbar spine with diffuse osseous metastatic disease Monitor closely  Intractable lower back pain Secondary to metastatic disease Continue IV with oral pain medications Dexamethasone as per oncology    Metastatic colon cancer Patient on chemotherapy and radiation therapy Oncology on board   Diabetes mellitus type 2, uncontrolled with hyperglycemia Likely worsened 2/2 steroids SSI, Semglee, Accu-Cheks, hypoglycemic protocol   Hyperlipidemia Continue with statin   Hypertension Continue lisinopril, Toprol  Hx/o PE Continue heparin and stop as per IR orders Plan to switch back to Pradaxa  Hyponatremia Daily BMP   Transaminitis Elevated alk phos most liikely secondary to  colon cancer with bony mets   Moderate severe malnutrition Nutrition consult.    Macrocytic anemia Thrombocytopenia Secondary to cancer/chemotherapy     DVT prophylaxis: Heparin drip   Code Status:   Code Status: Full Code Level of care: Med-Surg Status is: Inpatient Dispo: pending workup for weakness   Consults called: Oncology, neurology     Objective: Vitals:   05/26/23 2120 05/27/23 0547 05/27/23 0942 05/27/23 1340  BP: (!) 135/90 (!) 130/91  (!) 122/92  Pulse: 75 83  75  Resp: 18 16  18   Temp: 98.2 F (36.8 C) 98.3 F (36.8 C)  98.5 F (36.9 C)  TempSrc: Oral Oral  Oral  SpO2: 98% 96% 96% 97%  Weight:      Height:        Intake/Output Summary (Last 24 hours) at 05/27/2023 1721 Last data filed at 05/27/2023 1618 Gross per 24 hour  Intake 215.85 ml  Output --  Net 215.85 ml   Filed Weights   05/24/23 1734  Weight: 84.8 kg   Body mass index is 26.08 kg/m.   General: NAD  Cardiovascular: S1, S2 present Respiratory: CTAB Abdomen: Soft, nontender, nondistended, bowel sounds present Musculoskeletal: No bilateral pedal edema noted, sensation intact, strength limited by pain Skin: Normal Psychiatry: Normal mood     I have personally reviewed the following labs and images: CBC: Recent Labs  Lab 05/22/23 0828 05/24/23 1943 05/25/23 0541 05/26/23 0450 05/27/23 0612  WBC 11.3* 11.7* 9.6 9.6 8.9  NEUTROABS 8.4* 8.8*  --   --   --   HGB 10.1* 9.1* 9.4* 8.8* 9.4*  HCT 30.0* 28.9* 29.7* 26.9* 29.3*  MCV 98.7 103.2* 103.1* 101.5*  101.7*  PLT 53* PLATELET CLUMPS NOTED ON SMEAR, UNABLE TO ESTIMATE 72* 79* 94*   BMP &GFR Recent Labs  Lab 05/24/23 1912 05/25/23 0115 05/25/23 0541 05/26/23 0450 05/27/23 0612  NA 132*  --  132* 133* 132*  K 4.0  --  4.5 4.8 4.9  CL 104  --  98 99 97*  CO2 19*  --  25 24 25   GLUCOSE 177*  --  123* 186* 208*  BUN 25*  --  21* 18 23*  CREATININE 0.48* 0.57* 0.67 0.57* 0.72  CALCIUM 7.1*  --  8.1* 8.3* 8.2*  MG  1.4*  --   --   --   --    Estimated Creatinine Clearance: 111.1 mL/min (by C-G formula based on SCr of 0.72 mg/dL). Liver & Pancreas: Recent Labs  Lab 05/24/23 1912 05/25/23 0541 05/26/23 0450 05/27/23 0612  AST 17 21 18 16   ALT 9 9 9 11   ALKPHOS 681* 794* 695* 665*  BILITOT 0.5 0.6 0.8 0.6  PROT 5.0* 6.1* 6.0* 6.2*  ALBUMIN 2.2* 2.4* 2.4* 2.4*   No results for input(s): "LIPASE", "AMYLASE" in the last 168 hours. No results for input(s): "AMMONIA" in the last 168 hours. Diabetic: Recent Labs    05/25/23 0540  HGBA1C 9.5*   Recent Labs  Lab 05/26/23 1602 05/26/23 2121 05/27/23 0810 05/27/23 1147 05/27/23 1659  GLUCAP 278* 250* 198* 256* 252*   Cardiac Enzymes: Recent Labs  Lab 05/25/23 1833  CKTOTAL 188   No results for input(s): "PROBNP" in the last 8760 hours. Coagulation Profile: Recent Labs  Lab 05/27/23 1055  INR 1.2   Thyroid Function Tests: Recent Labs    05/26/23 0450  TSH 0.638   Lipid Profile: No results for input(s): "CHOL", "HDL", "LDLCALC", "TRIG", "CHOLHDL", "LDLDIRECT" in the last 72 hours. Anemia Panel: Recent Labs    05/26/23 1645  VITAMINB12 1,939*   Urine analysis:    Component Value Date/Time   COLORURINE YELLOW 07/22/2022 0822   APPEARANCEUR CLEAR 07/22/2022 0822   LABSPEC 1.026 07/22/2022 0822   PHURINE 6.0 07/22/2022 0822   GLUCOSEU NEGATIVE 07/22/2022 0822   HGBUR NEGATIVE 07/22/2022 0822   BILIRUBINUR NEGATIVE 07/22/2022 0822   KETONESUR NEGATIVE 07/22/2022 0822   PROTEINUR 100 (A) 05/20/2023 0855   UROBILINOGEN 0.2 08/17/2014 1831   NITRITE NEGATIVE 07/22/2022 0822   LEUKOCYTESUR NEGATIVE 07/22/2022 0822   Sepsis Labs: Invalid input(s): "PROCALCITONIN", "LACTICIDVEN"  Microbiology: Recent Results (from the past 240 hours)  Resp panel by RT-PCR (RSV, Flu A&B, Covid) Anterior Nasal Swab     Status: None   Collection Time: 05/24/23  5:44 PM   Specimen: Anterior Nasal Swab  Result Value Ref Range Status   SARS  Coronavirus 2 by RT PCR NEGATIVE NEGATIVE Final    Comment: (NOTE) SARS-CoV-2 target nucleic acids are NOT DETECTED.  The SARS-CoV-2 RNA is generally detectable in upper respiratory specimens during the acute phase of infection. The lowest concentration of SARS-CoV-2 viral copies this assay can detect is 138 copies/mL. A negative result does not preclude SARS-Cov-2 infection and should not be used as the sole basis for treatment or other patient management decisions. A negative result may occur with  improper specimen collection/handling, submission of specimen other than nasopharyngeal swab, presence of viral mutation(s) within the areas targeted by this assay, and inadequate number of viral copies(<138 copies/mL). A negative result must be combined with clinical observations, patient history, and epidemiological information. The expected result is Negative.  Fact Sheet for  Patients:  BloggerCourse.com  Fact Sheet for Healthcare Providers:  SeriousBroker.it  This test is no t yet approved or cleared by the [place] FDA and  has been authorized for detection and/or diagnosis of SARS-CoV-2 by FDA under an Emergency Use Authorization (EUA). This EUA will remain  in effect (meaning this test can be used) for the duration of the COVID-19 declaration under Section 564(b)(1) of the Act, 21 U.S.C.section 360bbb-3(b)(1), unless the authorization is terminated  or revoked sooner.       Influenza A by PCR NEGATIVE NEGATIVE Final   Influenza B by PCR NEGATIVE NEGATIVE Final    Comment: (NOTE) The Xpert Xpress SARS-CoV-2/FLU/RSV plus assay is intended as an aid in the diagnosis of influenza from Nasopharyngeal swab specimens and should not be used as a sole basis for treatment. Nasal washings and aspirates are unacceptable for Xpert Xpress SARS-CoV-2/FLU/RSV testing.  Fact Sheet for  Patients: BloggerCourse.com  Fact Sheet for Healthcare Providers: SeriousBroker.it  This test is not yet approved or cleared by the [place] FDA and has been authorized for detection and/or diagnosis of SARS-CoV-2 by FDA under an Emergency Use Authorization (EUA). This EUA will remain in effect (meaning this test can be used) for the duration of the COVID-19 declaration under Section 564(b)(1) of the Act, 21 U.S.C. section 360bbb-3(b)(1), unless the authorization is terminated or revoked.     Resp Syncytial Virus by PCR NEGATIVE NEGATIVE Final    Comment: (NOTE) Fact Sheet for Patients: BloggerCourse.com  Fact Sheet for Healthcare Providers: SeriousBroker.it  This test is not yet approved or cleared by the [place] FDA and has been authorized for detection and/or diagnosis of SARS-CoV-2 by FDA under an Emergency Use Authorization (EUA). This EUA will remain in effect (meaning this test can be used) for the duration of the COVID-19 declaration under Section 564(b)(1) of the Act, 21 U.S.C. section 360bbb-3(b)(1), unless the authorization is terminated or revoked.  Performed at [facility], [address], [place], [place] [address]     Radiology Studies: DG FL GUIDED LUMBAR PUNCTURE Result Date: 05/27/2023 CLINICAL DATA:  Patient with history of metastatic colon cancer, back pain with lower extremity weakness/ gait dysfunction; lumbar puncture requested to assess for possible carcinomatous meningitis EXAM: LUMBAR PUNCTURE UNDER FLUOROSCOPY PROCEDURE: An appropriate skin entry site was determined fluoroscopically. Operator donned sterile gloves and mask. Skin site was marked, then prepped with Betadine, draped in usual sterile fashion, and infiltrated locally with 1% lidocaine. A 20 gauge spinal needle advanced into the thecal sac at L 3-4 from an  interlaminar approach. Clear colorless CSF spontaneously returned, with no significant elevated opening pressure; 13 ml CSF were collected and divided among 4 sterile vials for the requested laboratory studies. The needle was then removed. The patient tolerated the procedure well and there were no complications. FLUOROSCOPY: Radiation Exposure Index (as provided by the fluoroscopic device): 6.9 mGy Kerma IMPRESSION: Successful diagnostic lumbar puncture under fluoroscopy. Clear CSF sample returned, submitted for analysis as requested. This exam was performed by Artemio Aly and was supervised and interpreted by Dr. Roanna Banning. Electronically Signed   By: Roanna Banning M.D.   On: 05/27/2023 16:22    Scheduled Meds:  budesonide (PULMICORT) nebulizer solution  0.25 mg Nebulization BID   dexamethasone  4 mg Oral Daily   fentaNYL (SUBLIMAZE) injection  50 mcg Intravenous Once   insulin aspart  0-20 Units Subcutaneous TID WC   insulin aspart  0-5 Units Subcutaneous QHS   insulin  glargine-yfgn  20 Units Subcutaneous QHS   lisinopril  5 mg Oral Daily   metoprolol succinate  25 mg Oral Daily   Continuous Infusions:  sodium chloride 75 mL/hr at 05/27/23 1618   heparin        LOS: 3 days     Briant Cedar, MD [facility] [email or web address] 05/27/2023, 5:21 PM

## 2023-05-27 NOTE — Plan of Care (Signed)
 [facility] Test ID MAS2 Myelopathy, Autoimmune/Paraneoplastic Evaluation, Serum ordered ([email or web address])  CSF orders placed for cell counts in tubes 1 and 4, cytology, protein, glucose, meningitis/encephalitis panel and oligoclonal bands placed as well as [facility] Test ID ENC2 Encephalopathy, Autoimmune/Paraneoplastic Evaluation, Spinal Fluid ([email or web address]) also ordered  [facility] Test ID AIAES Axonal Neuropathy, Autoimmune/Paraneoplastic Evaluation, Serum could be considered if EMG/NCS shows axonal neuropathy in the future and if other workup is unrevealing (significant overlap with MAS2 panel but some unique markers here)   Brooke Dare MD-PhD [facility] [phone] Available 7 AM to 7 PM, outside these hours please contact Neurologist on call listed on AMION

## 2023-05-28 ENCOUNTER — Inpatient Hospital Stay: Payer: BC Managed Care – PPO | Admitting: Nurse Practitioner

## 2023-05-28 ENCOUNTER — Inpatient Hospital Stay: Payer: BC Managed Care – PPO

## 2023-05-28 ENCOUNTER — Other Ambulatory Visit (HOSPITAL_COMMUNITY): Payer: Self-pay

## 2023-05-28 DIAGNOSIS — R29898 Other symptoms and signs involving the musculoskeletal system: Secondary | ICD-10-CM | POA: Diagnosis not present

## 2023-05-28 LAB — CBC
HCT: 27.9 % — ABNORMAL LOW (ref 39.0–52.0)
Hemoglobin: 9 g/dL — ABNORMAL LOW (ref 13.0–17.0)
MCH: 32.4 pg (ref 26.0–34.0)
MCHC: 32.3 g/dL (ref 30.0–36.0)
MCV: 100.4 fL — ABNORMAL HIGH (ref 80.0–100.0)
Platelets: 97 10*3/uL — ABNORMAL LOW (ref 150–400)
RBC: 2.78 MIL/uL — ABNORMAL LOW (ref 4.22–5.81)
RDW: 18 % — ABNORMAL HIGH (ref 11.5–15.5)
WBC: 7.7 10*3/uL (ref 4.0–10.5)
nRBC: 0.4 % — ABNORMAL HIGH (ref 0.0–0.2)

## 2023-05-28 LAB — BASIC METABOLIC PANEL
Anion gap: 8 (ref 5–15)
BUN: 20 mg/dL (ref 6–20)
CO2: 23 mmol/L (ref 22–32)
Calcium: 8 mg/dL — ABNORMAL LOW (ref 8.9–10.3)
Chloride: 101 mmol/L (ref 98–111)
Creatinine, Ser: 0.67 mg/dL (ref 0.61–1.24)
GFR, Estimated: >60 mL/min (ref >60)
Glucose, Bld: 196 mg/dL — ABNORMAL HIGH (ref 70–99)
Potassium: 4.5 mmol/L (ref 3.5–5.1)
Sodium: 132 mmol/L — ABNORMAL LOW (ref 135–145)

## 2023-05-28 LAB — DIFFERENTIAL
Abs Immature Granulocytes: 0.33 10*3/uL — ABNORMAL HIGH (ref 0.00–0.07)
Basophils Absolute: 0 10*3/uL (ref 0.0–0.1)
Basophils Relative: 0 %
Eosinophils Absolute: 0 10*3/uL (ref 0.0–0.5)
Eosinophils Relative: 0 %
Immature Granulocytes: 4 %
Lymphocytes Relative: 7 %
Lymphs Abs: 0.5 10*3/uL — ABNORMAL LOW (ref 0.7–4.0)
Monocytes Absolute: 1.1 10*3/uL — ABNORMAL HIGH (ref 0.1–1.0)
Monocytes Relative: 15 %
Neutro Abs: 5.6 10*3/uL (ref 1.7–7.7)
Neutrophils Relative %: 74 %

## 2023-05-28 LAB — TECHNOLOGIST SMEAR REVIEW

## 2023-05-28 LAB — GLUCOSE, CAPILLARY: Glucose-Capillary: 132 mg/dL — ABNORMAL HIGH (ref 70–99)

## 2023-05-28 LAB — HEPARIN LEVEL (UNFRACTIONATED): Heparin Unfractionated: 0.16 [IU]/mL — ABNORMAL LOW (ref 0.30–0.70)

## 2023-05-28 MED ORDER — MORPHINE SULFATE ER 15 MG PO TBCR
30.0000 mg | EXTENDED_RELEASE_TABLET | Freq: Two times a day (BID) | ORAL | Status: DC
  Administered 2023-05-28: 30 mg via ORAL
  Filled 2023-05-28: qty 2

## 2023-05-28 MED ORDER — DABIGATRAN ETEXILATE MESYLATE 75 MG PO CAPS
150.0000 mg | ORAL_CAPSULE | Freq: Two times a day (BID) | ORAL | Status: DC
  Administered 2023-05-28: 150 mg via ORAL
  Filled 2023-05-28: qty 1
  Filled 2023-05-28: qty 2

## 2023-05-28 MED ORDER — POLYETHYLENE GLYCOL 3350 17 GM/SCOOP PO POWD
17.0000 g | Freq: Every day | ORAL | 0 refills | Status: DC
  Filled 2023-05-28: qty 238, 14d supply, fill #0

## 2023-05-28 MED ORDER — SENNOSIDES-DOCUSATE SODIUM 8.6-50 MG PO TABS
1.0000 | ORAL_TABLET | Freq: Every day | ORAL | 0 refills | Status: DC
  Filled 2023-05-28: qty 30, 30d supply, fill #0

## 2023-05-28 MED ORDER — HEPARIN SOD (PORK) LOCK FLUSH 100 UNIT/ML IV SOLN
500.0000 [IU] | Freq: Once | INTRAVENOUS | Status: AC
  Administered 2023-05-28: 500 [IU] via INTRAVENOUS
  Filled 2023-05-28: qty 5

## 2023-05-28 MED ORDER — NALOXONE HCL 4 MG/0.1ML NA LIQD
NASAL | 0 refills | Status: DC
  Filled 2023-05-28: qty 2, 30d supply, fill #0

## 2023-05-28 MED ORDER — MORPHINE SULFATE ER 30 MG PO TBCR
30.0000 mg | EXTENDED_RELEASE_TABLET | Freq: Two times a day (BID) | ORAL | 0 refills | Status: DC
Start: 2023-05-28 — End: 2023-06-10
  Filled 2023-05-28: qty 28, 14d supply, fill #0

## 2023-05-28 MED ORDER — HYDROMORPHONE HCL 4 MG PO TABS
4.0000 mg | ORAL_TABLET | Freq: Four times a day (QID) | ORAL | 0 refills | Status: DC | PRN
  Filled 2023-05-28: qty 30, 4d supply, fill #0

## 2023-05-28 NOTE — Plan of Care (Addendum)
 Patient is alert and oriented X4, all discharge information went over with patient and wife at the bedside. Chest port deaccesed and Heparin locked. Patient awaiting meds to beds delivery. Wife to transport patient home.  Problem: Education: Goal: Ability to describe self-care measures that may prevent or decrease complications (Diabetes Survival Skills Education) will improve Outcome: Adequate for Discharge Goal: Individualized Educational Video(s) Outcome: Adequate for Discharge   Problem: Coping: Goal: Ability to adjust to condition or change in health will improve Outcome: Adequate for Discharge   Problem: Fluid Volume: Goal: Ability to maintain a balanced intake and output will improve Outcome: Adequate for Discharge   Problem: Health Behavior/Discharge Planning: Goal: Ability to identify and utilize available resources and services will improve Outcome: Adequate for Discharge Goal: Ability to manage health-related needs will improve Outcome: Adequate for Discharge   Problem: Metabolic: Goal: Ability to maintain appropriate glucose levels will improve Outcome: Adequate for Discharge   Problem: Nutritional: Goal: Maintenance of adequate nutrition will improve Outcome: Adequate for Discharge Goal: Progress toward achieving an optimal weight will improve Outcome: Adequate for Discharge   Problem: Skin Integrity: Goal: Risk for impaired skin integrity will decrease Outcome: Adequate for Discharge   Problem: Tissue Perfusion: Goal: Adequacy of tissue perfusion will improve Outcome: Adequate for Discharge   Problem: Education: Goal: Knowledge of General Education information will improve Description: Including pain rating scale, medication(s)/side effects and non-pharmacologic comfort measures Outcome: Adequate for Discharge   Problem: Health Behavior/Discharge Planning: Goal: Ability to manage health-related needs will improve Outcome: Adequate for Discharge    Problem: Clinical Measurements: Goal: Ability to maintain clinical measurements within normal limits will improve Outcome: Adequate for Discharge Goal: Will remain free from infection Outcome: Adequate for Discharge Goal: Diagnostic test results will improve Outcome: Adequate for Discharge Goal: Respiratory complications will improve Outcome: Adequate for Discharge Goal: Cardiovascular complication will be avoided Outcome: Adequate for Discharge   Problem: Activity: Goal: Risk for activity intolerance will decrease Outcome: Adequate for Discharge   Problem: Nutrition: Goal: Adequate nutrition will be maintained Outcome: Adequate for Discharge   Problem: Coping: Goal: Level of anxiety will decrease Outcome: Adequate for Discharge   Problem: Elimination: Goal: Will not experience complications related to bowel motility Outcome: Adequate for Discharge Goal: Will not experience complications related to urinary retention Outcome: Adequate for Discharge   Problem: Pain Managment: Goal: General experience of comfort will improve and/or be controlled Outcome: Adequate for Discharge   Problem: Safety: Goal: Ability to remain free from injury will improve Outcome: Adequate for Discharge   Problem: Skin Integrity: Goal: Risk for impaired skin integrity will decrease Outcome: Adequate for Discharge

## 2023-05-28 NOTE — Discharge Summary (Signed)
 Physician Discharge Summary   Patient: [name] MRN: [id number] DOB: 11-19-1965  Admit date:     05/24/2023  Discharge date: 05/28/23  Discharge Physician: Briant Cedar   PCP: Joycelyn Rua, MD   Recommendations at discharge:   Follow-up with PCP Follow-up with medical/rad oncology as scheduled Ambulatory referral set up for neurology  Discharge Diagnoses: Principal Problem:   Weakness of both lower extremities Active Problems:   Type 2 diabetes mellitus (HCC)   HYPERTENSION, BENIGN   Intractable back pain   Hyperlipidemia   Cancer of descending colon s/p robotic left hemicolectomy 03/04/2019   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  Patient has been seen oncologist on chemotherapy with the last infusion on February 21. Patient is followed by Dr. [name]. Neurology consulted due to weakness and there was concern for proximal weakness leading to myopathy.  Because of this MRI of brain and cervical spine were ordered.  Question of mets to brain based on MRI.  Also cancer noted throughout cervical vertebrae.  Neurology requested LP by IR which was done on 05/27/23.    Today, patient still with generalized pain, improving with current pain regimen.  Denies any new complaints, able to ambulate the hallway without any weakness noted.  Eager to be discharged.  Advised close follow-up with PCP, medical/radiation oncology as scheduled.  Follow-up with neurology.  Advised patient on using narcotics as needed to avoid oversedation.  Narcan and strict bowel regimen prescribed.   Assessment and Plan: Bilateral lower extremity weakness Improved IR  LP done on 2/26, labs sent out, currently unremarkable, neurology will follow-up pending results MRI brain, cervical, thoracic, lumbar spine with diffuse osseous metastatic disease   Intractable lower back pain Secondary to metastatic disease Discharge on pain regimen with p.o. Dilaudid and MS Contin as per oncology Dexamethasone as per oncology    Metastatic colon cancer Patient on chemotherapy and radiation therapy Outpatient follow-up   Diabetes mellitus type 2, uncontrolled with hyperglycemia Likely worsened 2/2 steroids Continue home regimen   Hyperlipidemia Continue with statin   Hypertension Continue lisinopril, Toprol   Hx/o PE Continue home Pradaxa   Hyponatremia Stable   Transaminitis Elevated alk phos most liikely secondary to colon cancer with bony mets       Pain control - [place] Controlled Substance Reporting System database was reviewed. and patient was instructed, not to drive, operate heavy machinery, perform activities at heights, swimming or participation in water activities or provide baby-sitting services while on Pain, Sleep and Anxiety Medications; until their outpatient Physician has advised to do so again. Also recommended to not to take more than prescribed Pain, Sleep and Anxiety Medications.    Consultants: Oncology, IR, neurology Procedures performed: LP on 05/27/2023 Disposition: Home Diet recommendation:  Carb modified diet    DISCHARGE MEDICATION: Allergies as of 05/28/2023       Reactions   Cat Dander Anaphylaxis   Empagliflozin Other (See Comments)   Recurrent UTI        Medication List     STOP taking these medications    cetirizine 10 MG tablet Commonly known as: ZYRTEC   fluticasone  50 MCG/ACT nasal spray Commonly known as: FLONASE   HYDROcodone-acetaminophen 5-325 MG tablet Commonly known as: Norco   methocarbamol 500 MG tablet Commonly known as: ROBAXIN   oxyCODONE 5 MG immediate release  tablet Commonly known as: Oxy IR/ROXICODONE   traMADol 50 MG tablet Commonly known as: ULTRAM       TAKE these medications    acetaminophen 500 MG tablet Commonly known as: TYLENOL Take 1 tablet (500 mg total) by mouth daily as needed for mild pain.   albuterol 108 (90 Base) MCG/ACT inhaler Commonly known as: VENTOLIN HFA Inhale 1-2 puffs into the lungs every 6 (six) hours as needed for wheezing or shortness of breath.   aspirin EC 81 MG tablet Take 1 tablet (81 mg total) by mouth daily. Swallow whole.   atorvastatin 10 MG tablet Commonly known as: LIPITOR Take 1 tablet (10 mg total) by mouth daily.   calcium carbonate 750 MG chewable tablet Commonly known as: TUMS EX Chew 1,500 mg by mouth daily as needed for heartburn.   dabigatran 150 MG Caps capsule Commonly known as: PRADAXA Take 150 mg by mouth 2 (two) times daily.   dexamethasone 4 MG tablet Commonly known as: DECADRON Take 1 tablet (4 mg total) by mouth daily. Start on 05/15/2023   FISH OIL PO Take 1,000 mg by mouth 2 (two) times daily.   HYDROmorphone 4 MG tablet Commonly known as: DILAUDID Take 1-2 tablets (4-8 mg total) by mouth every 6 (six) hours as needed for up to 7 days for severe pain (pain score 7-10).   ibuprofen 200 MG tablet Commonly known as: ADVIL Take 200 mg by mouth daily as needed for mild pain.   lidocaine-prilocaine cream Commonly known as: EMLA Apply 1 Application topically as needed.   lisinopril 5 MG tablet Commonly known as: ZESTRIL Take 5 mg by mouth daily. What changed: Another medication with the same name was removed. Continue taking this medication, and follow the directions you see here.   loratadine 10 MG tablet Commonly known as: CLARITIN Take 10 mg by mouth daily as needed for allergies.   metFORMIN 1000 MG tablet Commonly known as: GLUCOPHAGE Take 1 tablet (1,000 mg total) by mouth 2 (two) times daily.   metoprolol succinate 25 MG 24 hr tablet Commonly known  as: TOPROL-XL Take 25 mg by mouth daily.   mometasone 220 MCG/INH inhaler Commonly known as: ASMANEX Inhale 1 puff into the lungs daily.   morphine 30 MG 12 hr tablet Commonly known as: MS CONTIN Take 1 tablet (30 mg total) by mouth every 12 (twelve) hours for 14 days.   naloxone 4 MG/0.1ML Liqd nasal spray kit Commonly known as: NARCAN For use if Opoid overdose is suspected   ONE TOUCH ULTRA TEST test strip Generic drug: glucose blood 1 each by Other route as needed (GLUCOSE).   pioglitazone 30 MG tablet Commonly known as: ACTOS Take 30 mg by mouth daily.   polyethylene glycol 17 g packet Commonly known as: MiraLax Take 17 g by mouth daily.   prochlorperazine 10 MG tablet Commonly known as: COMPAZINE TAKE 1 TABLET BY MOUTH EVERY 6 HOURS AS NEEDED FOR NAUSEA OR VOMITING.   repaglinide 2 MG tablet Commonly known as: PRANDIN Take 2 mg by mouth 2 (two) times daily.   senna-docusate 8.6-50 MG tablet Commonly known as: Senokot-S Take 1 tablet by mouth at bedtime.   Toujeo SoloStar 300 UNIT/ML Solostar Pen Generic drug: insulin glargine (1 Unit Dial) Inject 41 Units into the  skin daily.        Follow-up Information     Joycelyn Rua, MD. Schedule an appointment as soon as possible for a visit in 1 week(s).   Specialty: Family Medicine Contact information: [address] [place] [address] [phone]                Discharge Exam: Ceasar Mons Weights   05/24/23 1734  Weight: 84.8 kg   General: NAD  Cardiovascular: S1, S2 present Respiratory: CTAB Abdomen: Soft, nontender, nondistended, bowel sounds present Musculoskeletal: No bilateral pedal edema noted Skin: Normal Psychiatry: Normal mood   Condition at discharge: stable  The results of significant diagnostics from this hospitalization (including imaging, microbiology, ancillary and laboratory) are listed below for reference.   Imaging Studies: DG FL GUIDED LUMBAR PUNCTURE Result  Date: 05/27/2023 CLINICAL DATA:  Patient with history of metastatic colon cancer, back pain with lower extremity weakness/ gait dysfunction; lumbar puncture requested to assess for possible carcinomatous meningitis EXAM: LUMBAR PUNCTURE UNDER FLUOROSCOPY PROCEDURE: An appropriate skin entry site was determined fluoroscopically. Operator donned sterile gloves and mask. Skin site was marked, then prepped with Betadine, draped in usual sterile fashion, and infiltrated locally with 1% lidocaine. A 20 gauge spinal needle advanced into the thecal sac at L 3-4 from an interlaminar approach. Clear colorless CSF spontaneously returned, with no significant elevated opening pressure; 13 ml CSF were collected and divided among 4 sterile vials for the requested laboratory studies. The needle was then removed. The patient tolerated the procedure well and there were no complications. FLUOROSCOPY: Radiation Exposure Index (as provided by the fluoroscopic device): 6.9 mGy Kerma IMPRESSION: Successful diagnostic lumbar puncture under fluoroscopy. Clear CSF sample returned, submitted for analysis as requested. This exam was performed by Artemio Aly and was supervised and interpreted by Dr. Roanna Banning. Electronically Signed   By: Roanna Banning M.D.   On: 05/27/2023 16:22   MR CERVICAL SPINE W WO CONTRAST Result Date: 05/26/2023 CLINICAL DATA:  Initial evaluation for metastatic disease evaluation, myelopathy. EXAM: MRI CERVICAL SPINE WITHOUT AND WITH CONTRAST TECHNIQUE: Multiplanar and multiecho pulse sequences of the cervical spine, to include the craniocervical junction and cervicothoracic junction, were obtained without and with intravenous contrast. CONTRAST:  8mL GADAVIST GADOBUTROL 1 MMOL/ML IV SOLN COMPARISON:  Prior study from 02/04/2022 FINDINGS: Alignment: Straightening with mild reversal of the normal cervical lordosis. No significant listhesis. Vertebrae: Diffusely abnormal and heterogeneous appearance of the  visualized bone marrow, consistent with diffuse osseous metastatic disease. There is involvement of essentially all levels including the skull base. Small amount of extraosseous/epidural extension noted within the right ventral epidural space at T1 (series 6, image 7). No other significant extra osseous or epidural extension of tumor. No pathologic compression fracture. Cord: Normal signal and morphology. Posterior Fossa, vertebral arteries, paraspinal tissues: Unremarkable. Disc levels: C2-C3: Negative interspace. Mild left-sided facet hypertrophy. No spinal stenosis. Foramina remain patent. C3-C4: Small left paracentral disc protrusion mildly flattens and indents the left ventral thecal sac. Superimposed bilateral uncovertebral spurring with mild left-sided facet hypertrophy. No significant spinal stenosis. Mild left C4 foraminal narrowing. Right neural foramen remains patent. C4-C5: Degenerative intervertebral disc space narrowing with diffuse disc bulge and bilateral uncovertebral spurring. No spinal stenosis. Mild left C5 foraminal stenosis. Right neural foramina remains patent. C5-C6: Mild disc bulge with bilateral uncovertebral spurring. Mild left-sided facet spurring. No significant spinal stenosis. Mild left C6 foraminal narrowing. Right neural foramina remains patent. C6-C7: Degenerative disc space narrowing with diffuse disc bulge and  bilateral uncovertebral spurring, slightly asymmetric to the left. No significant spinal stenosis. Foramina remain patent. C7-T1: Degenerate intervertebral disc space narrowing with diffuse disc bulge and bilateral uncovertebral spurring. No significant spinal stenosis. Foramina remain patent. IMPRESSION: 1. Diffuse osseous metastatic disease involving the cervical spine. Small amount of extraosseous/epidural extension of tumor within the right ventral epidural space at T1 without stenosis. No other significant extra osseous or epidural extension of tumor. No pathologic  compression fracture. 2. Underlying mild multilevel cervical spondylosis without significant spinal stenosis. Mild left C4 through C6 foraminal narrowing. Electronically Signed   By: Rise Mu M.D.   On: 05/26/2023 06:42   MR BRAIN W WO CONTRAST Result Date: 05/26/2023 CLINICAL DATA:  Initial evaluation for metastatic disease evaluation. EXAM: MRI HEAD WITHOUT AND WITH CONTRAST TECHNIQUE: Multiplanar, multiecho pulse sequences of the brain and surrounding structures were obtained without and with intravenous contrast. CONTRAST:  8mL GADAVIST GADOBUTROL 1 MMOL/ML IV SOLN COMPARISON:  Comparison made with prior MRI from 03/01/2014. FINDINGS: Brain: Cerebral volume within normal limits. Patchy T2/FLAIR hyperintensity involving the supratentorial cerebral white matter, most consistent with chronic small vessel ischemic disease, mild in nature. No evidence for acute or subacute infarct. No acute intracranial hemorrhage. Few scattered chronic micro hemorrhages noted, most prominent of which present at the left lentiform nucleus. Punctate focus of enhancement noted at the left parieto-occipital junction (series 16, image 101). Additional punctate focus of enhancement noted within the left parietal lobe superiorly (series 16, image 116). Findings are nonspecific. No other mass lesion or abnormal enhancement. No other evidence for intra-axial metastatic disease. No mass effect or midline shift. No hydrocephalus or extra-axial fluid collection. Pituitary gland suprasellar region within normal limits. Vascular: Major intracranial vascular flow voids are maintained. Skull and upper cervical spine: Craniocervical junction within normal limits. Heterogeneous signal intensity within the visualized bone marrow with a few probable marrow replacing lesions about the skull base and calvarium, consistent with osseous metastatic disease. No visible extra osseous extension of tumor. No scalp soft tissue abnormality.  Sinuses/Orbits: Globes and orbital soft tissues within normal limits. Paranasal sinuses are largely clear. Trace left mastoid effusion noted, of doubtful significance. Other: None. IMPRESSION: 1. Two punctate foci of enhancement involving the left parieto-occipital region as above. While these findings are nonspecific, tiny metastatic lesions are suspected given patient history. A short interval follow-up MRI could be performed for further evaluation and to evaluate for interval change as warranted. 2. Heterogeneous bone marrow signal intensity with a few marrow replacing lesions about the skull base and calvarium, consistent with osseous metastatic disease. 3. No other acute intracranial abnormality. 4. Punctate underlying mild chronic microvascular ischemic disease. Electronically Signed   By: Rise Mu M.D.   On: 05/26/2023 06:32   MR Lumbar Spine W Wo Contrast Result Date: 05/24/2023 CLINICAL DATA:  Metastatic disease evaluation EXAM: MRI THORACIC AND LUMBAR SPINE WITHOUT AND WITH CONTRAST TECHNIQUE: Multiplanar and multiecho pulse sequences of the thoracic and lumbar spine were obtained without and with intravenous contrast. CONTRAST:  8mL GADAVIST GADOBUTROL 1 MMOL/ML IV SOLN COMPARISON:  None Available. FINDINGS: MRI THORACIC SPINE FINDINGS Alignment:  Levoscoliosis.  No static subluxation. Vertebrae: There is diffuse osseous metastatic disease affecting all of the thoracic levels. There is no acute compression fracture. Cord:  Normal signal and morphology. Paraspinal and other soft tissues: Limited visualization due to motion. Disc levels: There is no spinal canal stenosis. No abnormal epidural soft tissue. MRI LUMBAR SPINE FINDINGS Segmentation:  Standard. Alignment: S shaped scoliosis with  right apex at L1 and left apex at L5. Vertebrae: Diffuse osseous metastatic disease throughout the lumbar spine and sacrum. There is mild multilevel height loss but no acute fracture. Conus medullaris:  Extends to the L1 level and appears normal. Paraspinal and other soft tissues: Incompletely visualized left abdominal mass measuring at least 11.6 cm. Disc levels: L1-L2: Normal disc space and facet joints. No spinal canal stenosis. No neural foraminal stenosis. L2-L3: Small disc bulge. No spinal canal stenosis. No neural foraminal stenosis. L3-L4: Small disc bulge. No spinal canal stenosis. Mild bilateral neural foraminal stenosis. L4-L5: Small right asymmetric disc bulge and mild facet arthrosis. No spinal canal stenosis. Worsened severe right and moderate left neural foraminal stenosis. L5-S1: Normal disc space and facet joints. No spinal canal stenosis. No neural foraminal stenosis. Visualized sacrum: Normal. IMPRESSION: 1. Diffuse osseous metastatic disease throughout the thoracic and lumbar spine and sacrum. No acute fracture or spinal canal stenosis. 2. Incompletely visualized left abdominal mass measuring at least 11.6 cm. 3. Worsened severe right and moderate left L4-L5 neural foraminal stenosis due to combination of disc bulge and facet arthrosis. Electronically Signed   By: Deatra Robinson M.D.   On: 05/24/2023 21:51   MR THORACIC SPINE W WO CONTRAST Result Date: 05/24/2023 CLINICAL DATA:  Metastatic disease evaluation EXAM: MRI THORACIC AND LUMBAR SPINE WITHOUT AND WITH CONTRAST TECHNIQUE: Multiplanar and multiecho pulse sequences of the thoracic and lumbar spine were obtained without and with intravenous contrast. CONTRAST:  8mL GADAVIST GADOBUTROL 1 MMOL/ML IV SOLN COMPARISON:  None Available. FINDINGS: MRI THORACIC SPINE FINDINGS Alignment:  Levoscoliosis.  No static subluxation. Vertebrae: There is diffuse osseous metastatic disease affecting all of the thoracic levels. There is no acute compression fracture. Cord:  Normal signal and morphology. Paraspinal and other soft tissues: Limited visualization due to motion. Disc levels: There is no spinal canal stenosis. No abnormal epidural soft tissue.  MRI LUMBAR SPINE FINDINGS Segmentation:  Standard. Alignment: S shaped scoliosis with right apex at L1 and left apex at L5. Vertebrae: Diffuse osseous metastatic disease throughout the lumbar spine and sacrum. There is mild multilevel height loss but no acute fracture. Conus medullaris: Extends to the L1 level and appears normal. Paraspinal and other soft tissues: Incompletely visualized left abdominal mass measuring at least 11.6 cm. Disc levels: L1-L2: Normal disc space and facet joints. No spinal canal stenosis. No neural foraminal stenosis. L2-L3: Small disc bulge. No spinal canal stenosis. No neural foraminal stenosis. L3-L4: Small disc bulge. No spinal canal stenosis. Mild bilateral neural foraminal stenosis. L4-L5: Small right asymmetric disc bulge and mild facet arthrosis. No spinal canal stenosis. Worsened severe right and moderate left neural foraminal stenosis. L5-S1: Normal disc space and facet joints. No spinal canal stenosis. No neural foraminal stenosis. Visualized sacrum: Normal. IMPRESSION: 1. Diffuse osseous metastatic disease throughout the thoracic and lumbar spine and sacrum. No acute fracture or spinal canal stenosis. 2. Incompletely visualized left abdominal mass measuring at least 11.6 cm. 3. Worsened severe right and moderate left L4-L5 neural foraminal stenosis due to combination of disc bulge and facet arthrosis. Electronically Signed   By: Deatra Robinson M.D.   On: 05/24/2023 21:51   DG Lumbar Spine Complete Result Date: 05/20/2023 CLINICAL DATA:  Metastatic colon cancer with bone metastases. Increased lower back pain over the past week. Recently completed radiation therapy to T11-L4. EXAM: LUMBAR SPINE - COMPLETE 4+ VIEW COMPARISON:  Lumbar spine radiographs 04/11/2022. FINDINGS: Five views of the lumbar spine. Conventional numbering is assumed with 5 non-rib-bearing,  lumbar type vertebral bodies. Unchanged multilevel compression deformities from T11 through L4. Increased sclerosis of  the these vertebral bodies as well as L5 in the sacrum. Unchanged dextroscoliotic curvature of the upper lumbar spine and retrolisthesis of L2 on L3. IMPRESSION: Unchanged multilevel compression deformities from T11 through L4. Increased sclerosis of the these vertebral bodies as well as L5 and the sacrum, likely treatment related. Electronically Signed   By: Orvan Falconer M.D.   On: 05/20/2023 13:44    Microbiology: Results for orders placed or performed during the hospital encounter of 05/24/23  Resp panel by RT-PCR (RSV, Flu A&B, Covid) Anterior Nasal Swab     Status: None   Collection Time: 05/24/23  5:44 PM   Specimen: Anterior Nasal Swab  Result Value Ref Range Status   SARS Coronavirus 2 by RT PCR NEGATIVE NEGATIVE Final    Comment: (NOTE) SARS-CoV-2 target nucleic acids are NOT DETECTED.  The SARS-CoV-2 RNA is generally detectable in upper respiratory specimens during the acute phase of infection. The lowest concentration of SARS-CoV-2 viral copies this assay can detect is 138 copies/mL. A negative result does not preclude SARS-Cov-2 infection and should not be used as the sole basis for treatment or other patient management decisions. A negative result may occur with  improper specimen collection/handling, submission of specimen other than nasopharyngeal swab, presence of viral mutation(s) within the areas targeted by this assay, and inadequate number of viral copies(<138 copies/mL). A negative result must be combined with clinical observations, patient history, and epidemiological information. The expected result is Negative.  Fact Sheet for Patients:  BloggerCourse.com  Fact Sheet for Healthcare Providers:  SeriousBroker.it  This test is no t yet approved or cleared by the [place] FDA and  has been authorized for detection and/or diagnosis of SARS-CoV-2 by FDA under an Emergency Use Authorization (EUA). This EUA  will remain  in effect (meaning this test can be used) for the duration of the COVID-19 declaration under Section 564(b)(1) of the Act, 21 U.S.C.section 360bbb-3(b)(1), unless the authorization is terminated  or revoked sooner.       Influenza A by PCR NEGATIVE NEGATIVE Final   Influenza B by PCR NEGATIVE NEGATIVE Final    Comment: (NOTE) The Xpert Xpress SARS-CoV-2/FLU/RSV plus assay is intended as an aid in the diagnosis of influenza from Nasopharyngeal swab specimens and should not be used as a sole basis for treatment. Nasal washings and aspirates are unacceptable for Xpert Xpress SARS-CoV-2/FLU/RSV testing.  Fact Sheet for Patients: BloggerCourse.com  Fact Sheet for Healthcare Providers: SeriousBroker.it  This test is not yet approved or cleared by the [place] FDA and has been authorized for detection and/or diagnosis of SARS-CoV-2 by FDA under an Emergency Use Authorization (EUA). This EUA will remain in effect (meaning this test can be used) for the duration of the COVID-19 declaration under Section 564(b)(1) of the Act, 21 U.S.C. section 360bbb-3(b)(1), unless the authorization is terminated or revoked.     Resp Syncytial Virus by PCR NEGATIVE NEGATIVE Final    Comment: (NOTE) Fact Sheet for Patients: BloggerCourse.com  Fact Sheet for Healthcare Providers: SeriousBroker.it  This test is not yet approved or cleared by the [place] FDA and has been authorized for detection and/or diagnosis of SARS-CoV-2 by FDA under an Emergency Use Authorization (EUA). This EUA will remain in effect (meaning this test can be used) for the duration of the COVID-19 declaration under Section 564(b)(1) of the Act, 21 U.S.C. section 360bbb-3(b)(1), unless the authorization is terminated  or revoked.  Performed at [facility], [address] [place], [place] [address]     Labs: CBC: Recent Labs  Lab 05/22/23 [phone] 05/24/23 1943 05/25/23 0541 05/26/23 0450 05/27/23 0612 05/28/23 0259  WBC 11.3* 11.7* 9.6 9.6 8.9 7.7  NEUTROABS 8.4* 8.8*  --   --   --  5.6  HGB 10.1* 9.1* 9.4* 8.8* 9.4* 9.0*  HCT 30.0* 28.9* 29.7* 26.9* 29.3* 27.9*  MCV 98.7 103.2* 103.1* 101.5* 101.7* 100.4*  PLT 53* PLATELET CLUMPS NOTED ON SMEAR, UNABLE TO ESTIMATE 72* 79* 94* 97*   Basic Metabolic Panel: Recent Labs  Lab 05/24/23 1912 05/25/23 0115 05/25/23 0541 05/26/23 0450 05/27/23 0612 05/28/23 0259  NA 132*  --  132* 133* 132* 132*  K 4.0  --  4.5 4.8 4.9 4.5  CL 104  --  98 99 97* 101  CO2 19*  --  25 24 25 23   GLUCOSE 177*  --  123* 186* 208* 196*  BUN 25*  --  21* 18 23* 20  CREATININE 0.48* 0.57* 0.67 0.57* 0.72 0.67  CALCIUM 7.1*  --  8.1* 8.3* 8.2* 8.0*  MG 1.4*  --   --   --   --   --    Liver Function Tests: Recent Labs  Lab 05/24/23 1912 05/25/23 0541 05/26/23 0450 05/27/23 0612  AST 17 21 18 16   ALT 9 9 9 11   ALKPHOS 681* 794* 695* 665*  BILITOT 0.5 0.6 0.8 0.6  PROT 5.0* 6.1* 6.0* 6.2*  ALBUMIN 2.2* 2.4* 2.4* 2.4*   CBG: Recent Labs  Lab 05/27/23 0810 05/27/23 1147 05/27/23 1659 05/27/23 2139 05/28/23 0734  GLUCAP 198* 256* 252* 275* 132*    Discharge time spent: greater than 30 minutes.  Signed: Briant Cedar, MD [facility] 05/28/2023

## 2023-05-28 NOTE — Progress Notes (Addendum)
 Discharge medications delivered from [facility] outpatient pharmacy by this RN

## 2023-05-28 NOTE — Progress Notes (Signed)
 PHARMACY - ANTICOAGULATION CONSULT NOTE  Pharmacy Consult for heparin Indication: history DVT  Allergies  Allergen Reactions   Cat Dander Anaphylaxis   Empagliflozin Other (See Comments)    Recurrent UTI    Patient Measurements: Height: 5\' 11"  (180.3 cm) Weight: 84.8 kg (187 lb) IBW/kg (Calculated) : 75.3 Heparin Dosing Weight: 84.8 kg  Vital Signs: Temp: 98.3 F (36.8 C) (02/26 2134) Temp Source: Oral (02/26 2134) BP: 121/89 (02/26 2134) Pulse Rate: 88 (02/26 2134)  Labs: Recent Labs    05/25/23 1833 05/26/23 0450 05/27/23 0612 05/27/23 1055 05/28/23 0259  HGB  --  8.8* 9.4*  --  9.0*  HCT  --  26.9* 29.3*  --  27.9*  PLT  --  79* 94*  --  97*  LABPROT  --   --   --  15.7*  --   INR  --   --   --  1.2  --   HEPARINUNFRC  --   --  0.12*  --  0.16*  CREATININE  --  0.57* 0.72  --  0.67  CKTOTAL 188  --   --   --   --     Estimated Creatinine Clearance: 111.1 mL/min (by C-G formula based on SCr of 0.67 mg/dL).   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   History of hiatal hernia    Hyperlipidemia    Hypertension    Diagnostic exercise tolerance test assessment:04/30/2010 : comments normal -no evidence os ischemia by ST analysis   lipoma    Lipoma 01/26/2019   Mitral regurgitation    Echo 12/2018: EF 60-65, myxomatous mitral valve, severe mitral regurgitation, partial flail leaflet of posterior mitral valve, myxomatous tricuspid valve with trivial TR, ascending aorta mildly dilated  (39 mm)   Pneumonia    as child   PONV (postoperative nausea and vomiting)    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 Pending possible muscle biopsy. Pharmacy consulted for heparin management. Discussed with [facility] - given relatively high risk with active cancer, will switch to heparin infusion rather than VTE prophylaxis.  Hgb and plt low but stable. Last dose of Pradaxa 150 mg 2/25 at 1200.  Today, 05/28/23 Heparin level = 0.16 (subtherapeutic) with heparin gtt @ 1600 units/hr Hgb = 9, Pltc 97k No bleeding or complications of therapy per nurse  Goal of Therapy:  Heparin level 0.3-0.7 units/ml Monitor platelets by anticoagulation protocol: Yes   Plan:  -Increase IV Heparin infusion to 1800 units/hr -Check 6 hour heparin level following rate  increase -Daily CBC   [name], Joselyn Glassman, PharmD Clinical Pharmacist 05/28/2023 3:40 AM

## 2023-05-28 NOTE — Progress Notes (Addendum)
 IP PROGRESS NOTE  Subjective:   [name] reports increased pain during the night.  The pain improves with Dilaudid.  He develops drowsiness with Dilaudid.  He feels the left leg is stronger and the right leg is weaker today.  He can ambulate.  He underwent a lumbar puncture yesterday.  He tolerated procedure well.  Objective: Vital signs in last 24 hours: Blood pressure (!) 138/90, pulse 87, temperature 98.8 F (37.1 C), temperature source Oral, resp. rate 18, height 5\' 11"  (1.803 m), weight 187 lb (84.8 kg), SpO2 97%.  Intake/Output from previous day: 02/26 0701 - 02/27 0700 In: 1169.9 [I.V.:1169.9] Out: -   Physical Exam:  HEENT: No thrush or ulcers  Abdomen: Nontender, no hepatosplenomegaly Extremities: No leg edema Neurologic: Alert and oriented, 4+/5 strength of the right bicep, decreased strength at the left greater than right leg with hip flexion/knee extension, 3-4/5 strength with flexion at the left hip, 4/5 strength with flexion at the right hip  Portacath/PICC-without erythema  Lab Results: Recent Labs    05/27/23 0612 05/28/23 0259  WBC 8.9 7.7  HGB 9.4* 9.0*  HCT 29.3* 27.9*  PLT 94* 97*    BMET Recent Labs    05/27/23 0612 05/28/23 0259  NA 132* 132*  K 4.9 4.5  CL 97* 101  CO2 25 23  GLUCOSE 208* 196*  BUN 23* 20  CREATININE 0.72 0.67  CALCIUM 8.2* 8.0*    Lab Results  Component Value Date   CEA1 2.00 07/13/2020   CEA 38.61 (H) 05/20/2023    Studies/Results: DG FL GUIDED LUMBAR PUNCTURE Result Date: 05/27/2023 CLINICAL DATA:  Patient with history of metastatic colon cancer, back pain with lower extremity weakness/ gait dysfunction; lumbar puncture requested to assess for possible carcinomatous meningitis EXAM: LUMBAR PUNCTURE UNDER FLUOROSCOPY PROCEDURE: An appropriate skin entry site was determined fluoroscopically. Operator donned sterile gloves and mask. Skin site was marked, then prepped with Betadine, draped in usual sterile fashion,  and infiltrated locally with 1% lidocaine. A 20 gauge spinal needle advanced into the thecal sac at L 3-4 from an interlaminar approach. Clear colorless CSF spontaneously returned, with no significant elevated opening pressure; 13 ml CSF were collected and divided among 4 sterile vials for the requested laboratory studies. The needle was then removed. The patient tolerated the procedure well and there were no complications. FLUOROSCOPY: Radiation Exposure Index (as provided by the fluoroscopic device): 6.9 mGy Kerma IMPRESSION: Successful diagnostic lumbar puncture under fluoroscopy. Clear CSF sample returned, submitted for analysis as requested. This exam was performed by Artemio Aly and was supervised and interpreted by Dr. Roanna Banning. Electronically Signed   By: Roanna Banning M.D.   On: 05/27/2023 16:22    Medications: I have reviewed the patient's current medications.  Assessment/Plan: Adenocarcinoma of the descending colon, stage IIIC (F6O1H), moderately differentiated adenocarcinoma with mucinous and signet cell features, status post a left colectomy 03/04/2019 Mass felt to be in the transverse colon on a colonoscopy 01/07/2019 with a biopsy confirming poorly differentiated adenocarcinoma with signet ring cell features, mismatch repair protein expression normal CT abdomen/pelvis 01/08/2019 12.3 cm indeterminate splenic mass, present in 2015 on a lumbar MRI-felt to be benign, small subpleural and pleural-based nodules at the lung bases, status post left nephrectomy CT chest 01/20/2019-small bilateral pulmonary nodules with rounded parenchymal nodules in the right lower lobe, indeterminate splenic mass CT chest 03/18/2019-multiple subcentimeter pulmonary nodules again seen bilaterally, greatest in the lower lobes, without significant change.  No new or enlarging pulmonary nodules  or masses.  Partially visualized large heterogeneous splenic mass with no significant change.  Plan for follow-up chest  CT at a 81-month interval. Cycle 1 FOLFOX 03/21/2019 Cycle 2 FOLFOX 04/04/2019 Cycle 3 FOLFOX 04/18/2019 (oxaliplatin dose reduced secondary to thrombocytopenia) Cycle 4 FOLFOX 05/02/2019 (oxaliplatin held secondary to thrombocytopenia) Cycle 5 FOLFOX 05/16/2019 Cycle 6 FOLFOX 05/30/2019 (oxaliplatin held secondary to thrombocytopenia) CT chest 06/08/2019-stable scattered small solid pulmonary nodules  Cycle 7 FOLFOX 06/13/2019 Cycle 8 FOLFOX 06/27/2019 Cycle 9 FOLFOX 07/11/2019 Cycle 10 FOLFOX 07/25/2019 (oxaliplatin held due to thrombocytopenia) Cycle 11 FOLFOX 08/08/2019 Cycle 12 FOLFOX 08/22/2019 CTs 01/09/2020-no evidence of recurrent disease, stable lung nodules favored to represent subpleural lymph nodes-dedicated follow-up not recommended CTs 01/11/2021-no evidence of recurrent disease CTs 01/24/2022-no evidence of recurrent disease CT lumbar spine, abdomen/pelvis 04/11/2022-numerous mixed lytic and sclerotic lesions scattered throughout the visualized axial and appendicular skeleton including several areas in the lumbar spine.   04/21/2022 CT biopsy sclerotic lesion right anterior iliac bone-consistent with metastatic colorectal adenocarcinoma, moderate to poorly differentiated.  Foundation 1-microsatellite stable, tumor mutation burden 4, K-ras G12S PET scan 05/09/2022-widespread hypermetabolic mixed faintly lytic and sclerotic osseous metastases throughout the axial and proximal appendicular skeleton; associated mild pathologic compression fracture at L3; no hypermetabolic extraosseous metastatic disease. Cycle 1 FOLFIRI/bevacizumab 05/26/2022 Cycle 2 FOLFIRI/bevacizumab 06/09/2022 Cycle 3 FOLFIRI 06/24/2022, bevacizumab 06/26/2022 Cycle 4 FOLFIRI 07/08/2022, bevacizumab held due to proteinuria Cycle 5 FOLFIRI 07/22/2022, bevacizumab held due to proteinuria CTs 07/31/2022-potentially areas of sclerosis represent interval healing; new lytic lesion at T12, multiple new areas of punched-out sclerosis throughout  the visualized osseous structures.  Index lesion in the left iliac wing measures 1.3 x 2.8 cm.  Healing of previously metabolically occult metastatic disease on 05/09/2022 is a possibility. Cycle 6 FOLFIRI 08/04/2022, irinotecan dose reduced due to thrombocytopenia, bevacizumab held pending 24-hour urine result Cycle 7 FOLFIRI 08/18/2022, bevacizumab held secondary to proteinuria, G-CSF held Cycle 8 FOLFIRI 09/01/2022, bevacizumab held, G-CSF Cycle 9 FOLFIRI 09/16/2022, bevacizumab held, G-CSF CTs 09/29/2022-enlargement of a hypodense lesion posterior left lobe of the liver, similar diffuse mixed lytic and sclerotic osseous metastatic disease, multiple new small nodules medial right lung base, multiple other small bilateral pulmonary nodules unchanged. Cycle 10 FOLFIRI plus Avastin 10/08/2022 Cycle 11 FOLFIRI 10/27/2022, Avastin held Cycle 12 FOLFIRI/Avastin 11/11/2022 Cycle 13 FOLFIRI/Avastin 11/25/2022 CTs 12/08/2022-stable lesion posterior left lobe of the liver.  Unchanged diffuse lytic and sclerotic osseous metastatic disease.  Stable small solid pulmonary nodules.  Stable splenic lesions.  Slightly increased bladder wall thickening. Cycle 14 FOLFIRI/Avastin 12/10/2022 Cycle 15 FOLFIRI/Avastin 12/24/2022 12/31/2022 24-hour urine protein 710 mg Cycle 16 FOLFIRI/Avastin 01/07/2023 Cycle 17 FOLFIRI 01/21/2023, Avastin held secondary to persistent proteinuria, slight increase in creatinine, and drainage from the suprapubic lesion CTs 01/26/2023-unchanged widespread sclerotic osseous metastatic disease, unchanged hypodense liver lesions, occasional small bilateral lung nodules unchanged. Cycle 18 FOLFIRI 02/03/2023, Avastin held Cycle 19 FOLFIRI 02/17/2023, Avastin held Cycle 20 FOLFIRI 03/03/2023, Avastin held Cycle 21 FOLFIRI 03/16/2023, Avastin held CTs 04/04/2023: Right increased sclerosis of osseous metastatic disease with new sclerotic lesions of the sternum/manubrium and increase sclerotic metastases throughout  the pelvis, unchanged hypodense liver lesions Cycle 22 FOLFIRI 04/07/2023, Avastin held Cycle 23 FOLFIRI/Avastin 04/22/2023 Cycle 24 FOLFIRI/Avastin 05/06/2023   Multiple colon polyps-ascending colon polyps on the colonoscopy 01/07/2019-not removed Colonoscopy 01/03/2020-multiple polyps removed, tubular adenomas, inflammatory and hyperplastic polyps Wilms tumor at age 64, status post chemotherapy/radiation and a nephrectomy in [place] Hurthle cell adenomas, 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  Left nephrectomy at age 66 12.  Port-A-Cath placement, Dr. Michaell Cowing, 03/16/2019 13.  Thrombocytopenia secondary to chemotherapy-oxaliplatin dose reduced beginning with cycle 3 FOLFOX, improved 14.  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  Right groin soft tissue infection May 2023-status postsurgical debridement 18.  Left leg DVT-common femoral, femoral, popliteal, posterior tibial, and peroneal veins 19.  02/04/2022-MRI cervical spine-1.7 cm T1 lesion-indeterminate; bone scan 03/14/2022-focal intense activity at approximate T1 level felt to correspond to the lesion on MRI, additional foci of activity involving the lower thoracic and lumbar spine, right iliac bone and pubic symphysis.  CT lumbar spine, abdomen/pelvis 04/11/2022-numerous mixed lytic and sclerotic lesions scattered throughout the visualized axial and appendicular skeleton including several areas in the lumbar spine.  04/21/2022 CT biopsy sclerotic lesion right anterior iliac bone-consistent with metastatic colorectal adenocarcinoma, moderate to poorly differentiated 20.  Inflamed sebaceous cyst  08/18/2022-doxycycline Surgical resection of area of "hidradenitis " 03/19/2023 21.  Admission 05/24/2023 with increased back pain and new onset leg weakness MRI thoracic and lumbar spine 05/24/2023: Fuhs osseous metastatic disease, no acute compression fracture, normal cord signal but no spinal canal stenosis, no abnormal epidural soft tissue MRI cervical spine 05/25/2023: Diffuse osseous metastatic disease, small amount of extraosseous/epidural tumor at the right ventral epidural space at T1 without stenosis, no pathologic compression fracture MRI brain 05/25/2023: 2 punctate foci of enhancement in the left parieto-occipital region-tiny metastases suspected, osseous metastatic disease     [name] has metastatic colon cancer with extensive bone metastases.  He is admitted with increased pain and new onset leg weakness.  MRIs of the spine and brain reveal no clear explanation for the neurologic symptoms.  Pain is secondary to extensive bone metastases.  I doubt the leg weakness is related to steroids (he started Decadron 05/06/2023), but steroid or chemotherapy induced myopathy are possible.  The asymmetry of his symptoms also argues against treatment related myopathy.  I appreciate the thorough evaluation by neurology.  He underwent a diagnostic lumbar puncture yesterday.  We will follow-up on the CSF cytology.  Has persistent anemia/thrombocytopenia.  The hematologic findings are secondary to chemotherapy, radiation, and tumor involving the bone marrow.  The platelet count and hemoglobin are stable today.  [name] is scheduled for a consultation at [facility] next week.  He hopes to attend this appointment.  He is requiring frequent dosing of oral Dilaudid.  I will add MS Contin.  He would like to go home today if he is ambulatory and the pain is under control.  Dr. Kathrynn Running is considering a course of palliative radiation to the lower lumbar spine. Recommendations: Continue Dilaudid,  add MS Contin Out of bed, ambulate as tolerated Discontinue heparin, resume Pradaxa if okay with interventional radiology Outpatient follow-up with Dr. Kathrynn Running Outpatient follow-up will be scheduled at the Cancer center   LOS: 4 days   Thornton Papas, MD   05/28/2023, 8:16 AM

## 2023-05-28 NOTE — Progress Notes (Signed)
 Physical Therapy Treatment Patient Details Name: [name] MRN: [id number] DOB: 1966/03/08 Today's Date: 05/28/2023   History of Present Illness Pt presents with increasing lower back pain and weakness. Neurology consulted due to weakness and there was concern for proximal weakness leading to myopathy.  Because of this MRI of brain and cervical spine were ordered.  Question of mets to brain based on MRI.  Also cancer noted throughout cervical vertebrae.  Neurology requested LP by IR which was done on 05/27/23.  PMHx of adenocarcinoma with known metastases to the bones s/p chemotherapy (complicated by occiput and induced neuropathy), Wilm's tumor of the left kidney s/p left nephrectomy at approximately age [age], scoliosis, hypertension, hyperlipidemia, type 2 diabetes, endocarditis (2015), multiple lipomas    PT Comments  Pt ambulated in hallway and reports likely discharge home today.  Pt provided with gait belt for safety with steps entering home however pt feels spouse will be able to assist.  Pt also has RW at home.  Pt had no further concerns or questions about mobility upon d/c.     If plan is discharge home, recommend the following: Help with stairs or ramp for entrance;Assist for transportation;Assistance with cooking/housework   Can travel by private vehicle        Equipment Recommendations  None recommended by PT    Recommendations for Other Services       Precautions / Restrictions Precautions Precautions: Fall     Mobility  Bed Mobility Overal bed mobility: Needs Assistance Bed Mobility: Supine to Sit, Sit to Supine     Supine to sit: Supervision Sit to supine: Supervision   General bed mobility comments: provided cues for log roll technique however pt prefers his own method that is close to this with knees flexed and using hands around knees to self assist; attempted gait belt for L LE back to bed (he reports weaker more painful left LE)    Transfers Overall  transfer level: Modified independent                 General transfer comment: pt reports ambulating to/from bathroom this morning without assist    Ambulation/Gait Ambulation/Gait assistance: Supervision Gait Distance (Feet): 380 Feet Assistive device: Rolling walker (2 wheels) Gait Pattern/deviations: Step-through pattern, Decreased stride length Gait velocity: decreased     General Gait Details: pt heavily reliant on UEs due to LE weakness, pt preferred longer distance however encouraged shorter more frequent distances at home for safety and endurance   Stairs             Wheelchair Mobility     Tilt Bed    Modified Rankin (Stroke Patients Only)       Balance                                            Communication Communication Communication: No apparent difficulties  Cognition Arousal: Alert Behavior During Therapy: WFL for tasks assessed/performed   PT - Cognitive impairments: No apparent impairments                         Following commands: Intact      Cueing Cueing Techniques: Verbal cues  Exercises      General Comments        Pertinent Vitals/Pain Pain Assessment Pain Assessment: 0-10 Pain Score: 5  Pain Location: back  with bed mobility Pain Descriptors / Indicators: Discomfort Pain Intervention(s): Repositioned, Monitored during session    Home Living                          Prior Function            PT Goals (current goals can now be found in the care plan section) Progress towards PT goals: Progressing toward goals    Frequency    Min 1X/week      PT Plan      Co-evaluation              AM-PAC PT "6 Clicks" Mobility   Outcome Measure  Help needed turning from your back to your side while in a flat bed without using bedrails?: None Help needed moving from lying on your back to sitting on the side of a flat bed without using bedrails?: None Help needed moving  to and from a bed to a chair (including a wheelchair)?: None Help needed standing up from a chair using your arms (e.g., wheelchair or bedside chair)?: None Help needed to walk in hospital room?: A Little Help needed climbing 3-5 steps with a railing? : A Little 6 Click Score: 22    End of Session Equipment Utilized During Treatment: Gait belt Activity Tolerance: Patient tolerated treatment well Patient left: in bed;with call bell/phone within reach   PT Visit Diagnosis: Muscle weakness (generalized) (M62.81);Difficulty in walking, not elsewhere classified (R26.2)     Time: 8657-8469 PT Time Calculation (min) (ACUTE ONLY): 17 min  Charges:    $Gait Training: 8-22 mins PT General Charges $$ ACUTE PT VISIT: 1 Visit                    Paulino Door, DPT Physical Therapist Acute Rehabilitation Services Office: [phone]    [name] 05/28/2023, 12:48 PM

## 2023-05-28 NOTE — Progress Notes (Signed)
 LP results reviewed.  The elevated protein is of uncertain significance, and I am not certain I would make any changes based on this alone.  There is usually a pleocytosis with carcinomatous meningitis, but cytology is still pending.  If cytology is negative, then I feel like the next steps would be following up autoimmune panels and EMG as an outpatient.  I would not have any further acute recommendations on an inpatient basis.  Please call with further questions or concerns.  Ritta Slot, MD [facility]   If 7pm- 7am, please page neurology on call as listed in AMION.

## 2023-05-29 ENCOUNTER — Telehealth: Payer: Self-pay | Admitting: *Deleted

## 2023-05-29 LAB — CYTOLOGY - NON PAP

## 2023-05-29 NOTE — Telephone Encounter (Signed)
 Called [name] to f/u on status since hospital D/C on Thursday. He reports his pain is fairly under control, bowels moving, he is eating. Blood sugars are higher, but he is working on it. Able to ambulate OK. Unsure about going to [place]. Will need to catch flight Sunday and not sure yet if he can manage the airport very well.

## 2023-06-01 ENCOUNTER — Encounter: Payer: Self-pay | Admitting: Neurology

## 2023-06-02 ENCOUNTER — Other Ambulatory Visit: Payer: Self-pay | Admitting: Nurse Practitioner

## 2023-06-02 ENCOUNTER — Telehealth: Payer: Self-pay | Admitting: *Deleted

## 2023-06-02 ENCOUNTER — Other Ambulatory Visit: Payer: Self-pay

## 2023-06-02 DIAGNOSIS — C7951 Secondary malignant neoplasm of bone: Secondary | ICD-10-CM

## 2023-06-02 DIAGNOSIS — C61 Malignant neoplasm of prostate: Secondary | ICD-10-CM

## 2023-06-02 NOTE — Telephone Encounter (Signed)
 Per [name]: Hold dexamethsone, ASA and Pradaxa for now. F/U tomorrow as scheduled w/labs. Go to ER if he develops near syncope episodes, shortness of breath or spills large amount of blood in toilet. Left above information on his voice mail and sent via MyChart as well.

## 2023-06-02 NOTE — Telephone Encounter (Signed)
 [name] reports that beginning 3/3 each stool he has had shows dark, maroon colored blood that he notes is more than just a small amount. He does not see any clots. Admits to feeling lightheaded and weak on occasion, but denies shortness of breath. Denies any heartburn or epigastric/abdominal pain. Takes Dexamethasone 4 mg daily, ASA 81 mg daily and Pradaxa 150 mg bid (for DVT 1.5 years ago)--he did hold these meds today. Confirmed that he did make the trip to [place] yesterday.

## 2023-06-03 ENCOUNTER — Inpatient Hospital Stay: Payer: BC Managed Care – PPO

## 2023-06-03 ENCOUNTER — Encounter (HOSPITAL_COMMUNITY): Payer: Self-pay

## 2023-06-03 ENCOUNTER — Other Ambulatory Visit: Payer: Self-pay

## 2023-06-03 ENCOUNTER — Other Ambulatory Visit (HOSPITAL_BASED_OUTPATIENT_CLINIC_OR_DEPARTMENT_OTHER): Payer: Self-pay

## 2023-06-03 ENCOUNTER — Telehealth: Payer: Self-pay | Admitting: Nurse Practitioner

## 2023-06-03 ENCOUNTER — Telehealth: Payer: Self-pay | Admitting: Family Medicine

## 2023-06-03 ENCOUNTER — Encounter: Payer: Self-pay | Admitting: Nurse Practitioner

## 2023-06-03 ENCOUNTER — Inpatient Hospital Stay (HOSPITAL_COMMUNITY)
Admission: AD | Admit: 2023-06-03 | Discharge: 2023-06-06 | DRG: 603 | Disposition: A | Discharge status: Home / Self Care | Attending: Family Medicine | Admitting: Family Medicine

## 2023-06-03 ENCOUNTER — Inpatient Hospital Stay: Payer: BC Managed Care – PPO | Attending: Oncology

## 2023-06-03 ENCOUNTER — Inpatient Hospital Stay: Payer: BC Managed Care – PPO | Admitting: Nurse Practitioner

## 2023-06-03 VITALS — BP 129/84 | HR 110 | Temp 98.1°F | Resp 18 | Ht 71.0 in | Wt 182.5 lb

## 2023-06-03 DIAGNOSIS — E8809 Other disorders of plasma-protein metabolism, not elsewhere classified: Secondary | ICD-10-CM | POA: Diagnosis present

## 2023-06-03 DIAGNOSIS — Z86718 Personal history of other venous thrombosis and embolism: Secondary | ICD-10-CM | POA: Diagnosis not present

## 2023-06-03 DIAGNOSIS — R6889 Other general symptoms and signs: Secondary | ICD-10-CM | POA: Diagnosis not present

## 2023-06-03 DIAGNOSIS — Z9049 Acquired absence of other specified parts of digestive tract: Secondary | ICD-10-CM | POA: Diagnosis not present

## 2023-06-03 DIAGNOSIS — L0889 Other specified local infections of the skin and subcutaneous tissue: Principal | ICD-10-CM | POA: Diagnosis present

## 2023-06-03 DIAGNOSIS — L089 Local infection of the skin and subcutaneous tissue, unspecified: Secondary | ICD-10-CM | POA: Diagnosis present

## 2023-06-03 DIAGNOSIS — D638 Anemia in other chronic diseases classified elsewhere: Secondary | ICD-10-CM | POA: Diagnosis present

## 2023-06-03 DIAGNOSIS — C787 Secondary malignant neoplasm of liver and intrahepatic bile duct: Secondary | ICD-10-CM | POA: Insufficient documentation

## 2023-06-03 DIAGNOSIS — Z7982 Long term (current) use of aspirin: Secondary | ICD-10-CM | POA: Diagnosis not present

## 2023-06-03 DIAGNOSIS — Z7984 Long term (current) use of oral hypoglycemic drugs: Secondary | ICD-10-CM

## 2023-06-03 DIAGNOSIS — C189 Malignant neoplasm of colon, unspecified: Secondary | ICD-10-CM

## 2023-06-03 DIAGNOSIS — C7951 Secondary malignant neoplasm of bone: Secondary | ICD-10-CM | POA: Insufficient documentation

## 2023-06-03 DIAGNOSIS — D696 Thrombocytopenia, unspecified: Secondary | ICD-10-CM | POA: Diagnosis not present

## 2023-06-03 DIAGNOSIS — E871 Hypo-osmolality and hyponatremia: Secondary | ICD-10-CM | POA: Diagnosis not present

## 2023-06-03 DIAGNOSIS — Z85038 Personal history of other malignant neoplasm of large intestine: Secondary | ICD-10-CM

## 2023-06-03 DIAGNOSIS — Z794 Long term (current) use of insulin: Secondary | ICD-10-CM | POA: Diagnosis not present

## 2023-06-03 DIAGNOSIS — D6489 Other specified anemias: Secondary | ICD-10-CM | POA: Diagnosis not present

## 2023-06-03 DIAGNOSIS — I10 Essential (primary) hypertension: Secondary | ICD-10-CM | POA: Diagnosis not present

## 2023-06-03 DIAGNOSIS — Z9221 Personal history of antineoplastic chemotherapy: Secondary | ICD-10-CM

## 2023-06-03 DIAGNOSIS — Z5112 Encounter for antineoplastic immunotherapy: Secondary | ICD-10-CM | POA: Insufficient documentation

## 2023-06-03 DIAGNOSIS — Z8546 Personal history of malignant neoplasm of prostate: Secondary | ICD-10-CM

## 2023-06-03 DIAGNOSIS — R531 Weakness: Secondary | ICD-10-CM | POA: Insufficient documentation

## 2023-06-03 DIAGNOSIS — K8020 Calculus of gallbladder without cholecystitis without obstruction: Secondary | ICD-10-CM | POA: Diagnosis not present

## 2023-06-03 DIAGNOSIS — C184 Malignant neoplasm of transverse colon: Secondary | ICD-10-CM | POA: Insufficient documentation

## 2023-06-03 DIAGNOSIS — D6959 Other secondary thrombocytopenia: Secondary | ICD-10-CM | POA: Diagnosis present

## 2023-06-03 DIAGNOSIS — C186 Malignant neoplasm of descending colon: Secondary | ICD-10-CM

## 2023-06-03 DIAGNOSIS — E785 Hyperlipidemia, unspecified: Secondary | ICD-10-CM | POA: Diagnosis present

## 2023-06-03 DIAGNOSIS — L0231 Cutaneous abscess of buttock: Principal | ICD-10-CM | POA: Diagnosis present

## 2023-06-03 DIAGNOSIS — Z85528 Personal history of other malignant neoplasm of kidney: Secondary | ICD-10-CM

## 2023-06-03 DIAGNOSIS — D7389 Other diseases of spleen: Secondary | ICD-10-CM | POA: Diagnosis not present

## 2023-06-03 DIAGNOSIS — Z6825 Body mass index (BMI) 25.0-25.9, adult: Secondary | ICD-10-CM | POA: Diagnosis not present

## 2023-06-03 DIAGNOSIS — Z8249 Family history of ischemic heart disease and other diseases of the circulatory system: Secondary | ICD-10-CM

## 2023-06-03 DIAGNOSIS — C61 Malignant neoplasm of prostate: Secondary | ICD-10-CM

## 2023-06-03 DIAGNOSIS — R627 Adult failure to thrive: Secondary | ICD-10-CM | POA: Diagnosis not present

## 2023-06-03 DIAGNOSIS — Z7902 Long term (current) use of antithrombotics/antiplatelets: Secondary | ICD-10-CM | POA: Diagnosis not present

## 2023-06-03 DIAGNOSIS — Z905 Acquired absence of kidney: Secondary | ICD-10-CM

## 2023-06-03 DIAGNOSIS — D630 Anemia in neoplastic disease: Secondary | ICD-10-CM | POA: Diagnosis not present

## 2023-06-03 DIAGNOSIS — J4530 Mild persistent asthma, uncomplicated: Secondary | ICD-10-CM | POA: Diagnosis not present

## 2023-06-03 DIAGNOSIS — G893 Neoplasm related pain (acute) (chronic): Secondary | ICD-10-CM | POA: Diagnosis not present

## 2023-06-03 DIAGNOSIS — E890 Postprocedural hypothyroidism: Secondary | ICD-10-CM | POA: Diagnosis not present

## 2023-06-03 DIAGNOSIS — Z923 Personal history of irradiation: Secondary | ICD-10-CM

## 2023-06-03 DIAGNOSIS — Z888 Allergy status to other drugs, medicaments and biological substances status: Secondary | ICD-10-CM

## 2023-06-03 DIAGNOSIS — E119 Type 2 diabetes mellitus without complications: Secondary | ICD-10-CM | POA: Diagnosis not present

## 2023-06-03 DIAGNOSIS — E782 Mixed hyperlipidemia: Secondary | ICD-10-CM

## 2023-06-03 DIAGNOSIS — Z5111 Encounter for antineoplastic chemotherapy: Secondary | ICD-10-CM | POA: Insufficient documentation

## 2023-06-03 DIAGNOSIS — Z800 Family history of malignant neoplasm of digestive organs: Secondary | ICD-10-CM

## 2023-06-03 DIAGNOSIS — I340 Nonrheumatic mitral (valve) insufficiency: Secondary | ICD-10-CM | POA: Diagnosis not present

## 2023-06-03 DIAGNOSIS — Z823 Family history of stroke: Secondary | ICD-10-CM

## 2023-06-03 DIAGNOSIS — C7801 Secondary malignant neoplasm of right lung: Secondary | ICD-10-CM | POA: Insufficient documentation

## 2023-06-03 DIAGNOSIS — Z79899 Other long term (current) drug therapy: Secondary | ICD-10-CM

## 2023-06-03 LAB — CBC WITH DIFFERENTIAL (CANCER CENTER ONLY)
Abs Immature Granulocytes: 0.21 10*3/uL — ABNORMAL HIGH (ref 0.00–0.07)
Basophils Absolute: 0 10*3/uL (ref 0.0–0.1)
Basophils Relative: 0 %
Eosinophils Absolute: 0.1 10*3/uL (ref 0.0–0.5)
Eosinophils Relative: 1 %
HCT: 28.4 % — ABNORMAL LOW (ref 39.0–52.0)
Hemoglobin: 9.4 g/dL — ABNORMAL LOW (ref 13.0–17.0)
Immature Granulocytes: 2 %
Lymphocytes Relative: 5 %
Lymphs Abs: 0.5 10*3/uL — ABNORMAL LOW (ref 0.7–4.0)
MCH: 32.6 pg (ref 26.0–34.0)
MCHC: 33.1 g/dL (ref 30.0–36.0)
MCV: 98.6 fL (ref 80.0–100.0)
Monocytes Absolute: 1 10*3/uL (ref 0.1–1.0)
Monocytes Relative: 10 %
Neutro Abs: 8.1 10*3/uL — ABNORMAL HIGH (ref 1.7–7.7)
Neutrophils Relative %: 82 %
Platelet Count: 123 10*3/uL — ABNORMAL LOW (ref 150–400)
RBC: 2.88 MIL/uL — ABNORMAL LOW (ref 4.22–5.81)
RDW: 18.2 % — ABNORMAL HIGH (ref 11.5–15.5)
WBC Count: 10 10*3/uL (ref 4.0–10.5)
nRBC: 0.5 % — ABNORMAL HIGH (ref 0.0–0.2)

## 2023-06-03 LAB — CMP (CANCER CENTER ONLY)
ALT: 9 U/L (ref 0–44)
AST: 22 U/L (ref 15–41)
Albumin: 3.3 g/dL — ABNORMAL LOW (ref 3.5–5.0)
Alkaline Phosphatase: 933 U/L — ABNORMAL HIGH (ref 38–126)
Anion gap: 11 (ref 5–15)
BUN: 26 mg/dL — ABNORMAL HIGH (ref 6–20)
CO2: 24 mmol/L (ref 22–32)
Calcium: 8.4 mg/dL — ABNORMAL LOW (ref 8.9–10.3)
Chloride: 94 mmol/L — ABNORMAL LOW (ref 98–111)
Creatinine: 0.71 mg/dL (ref 0.61–1.24)
GFR, Estimated: >60 mL/min (ref >60)
Glucose, Bld: 204 mg/dL — ABNORMAL HIGH (ref 70–99)
Potassium: 4.5 mmol/L (ref 3.5–5.1)
Sodium: 129 mmol/L — ABNORMAL LOW (ref 135–145)
Total Bilirubin: 0.6 mg/dL (ref 0.0–1.2)
Total Protein: 6.5 g/dL (ref 6.5–8.1)

## 2023-06-03 LAB — SAMPLE TO BLOOD BANK

## 2023-06-03 LAB — TOTAL PROTEIN, URINE DIPSTICK: Protein, ur: 30 mg/dL — AB

## 2023-06-03 LAB — C-REACTIVE PROTEIN: CRP: 15.4 mg/dL — ABNORMAL HIGH (ref <1.0)

## 2023-06-03 LAB — CEA (ACCESS): CEA (CHCC): 35.31 ng/mL — ABNORMAL HIGH (ref 0.00–5.00)

## 2023-06-03 LAB — FERRITIN: Ferritin: 2855 ng/mL — ABNORMAL HIGH (ref 24–336)

## 2023-06-03 MED ORDER — ONDANSETRON HCL 4 MG/2ML IJ SOLN
4.0000 mg | Freq: Four times a day (QID) | INTRAMUSCULAR | Status: DC | PRN
  Administered 2023-06-05 – 2023-06-06 (×2): 4 mg via INTRAVENOUS
  Filled 2023-06-03 (×2): qty 2

## 2023-06-03 MED ORDER — MELATONIN 3 MG PO TABS
3.0000 mg | ORAL_TABLET | Freq: Every evening | ORAL | Status: DC | PRN
  Administered 2023-06-05: 3 mg via ORAL
  Filled 2023-06-03: qty 1

## 2023-06-03 MED ORDER — LACTATED RINGERS IV SOLN: INTRAVENOUS | Status: AC

## 2023-06-03 MED ORDER — VANCOMYCIN HCL 1.5 G IV SOLR
1500.0000 mg | Freq: Two times a day (BID) | INTRAVENOUS | Status: DC
  Filled 2023-06-03: qty 30

## 2023-06-03 MED ORDER — LACTATED RINGERS IV BOLUS
1000.0000 mL | Freq: Once | INTRAVENOUS | Status: AC
  Administered 2023-06-04: 1000 mL via INTRAVENOUS

## 2023-06-03 MED ORDER — MORPHINE SULFATE (PF) 2 MG/ML IV SOLN
2.0000 mg | Freq: Once | INTRAVENOUS | Status: AC
  Administered 2023-06-03: 2 mg via INTRAVENOUS
  Filled 2023-06-03: qty 1

## 2023-06-03 MED ORDER — METRONIDAZOLE 500 MG/100ML IV SOLN
500.0000 mg | Freq: Two times a day (BID) | INTRAVENOUS | Status: DC
  Administered 2023-06-04 – 2023-06-06 (×6): 500 mg via INTRAVENOUS
  Filled 2023-06-03 (×6): qty 100

## 2023-06-03 MED ORDER — AMOXICILLIN-POT CLAVULANATE 500-125 MG PO TABS
1.0000 | ORAL_TABLET | Freq: Three times a day (TID) | ORAL | 0 refills | Status: AC
  Filled 2023-06-03: qty 21, 7d supply, fill #0

## 2023-06-03 MED ORDER — ACETAMINOPHEN 325 MG PO TABS: 650.0000 mg | ORAL_TABLET | Freq: Four times a day (QID) | ORAL | Status: DC | PRN

## 2023-06-03 MED ORDER — HYDROMORPHONE HCL 4 MG PO TABS: 4.0000 mg | ORAL_TABLET | Freq: Four times a day (QID) | ORAL | Status: DC | PRN

## 2023-06-03 MED ORDER — ACETAMINOPHEN 650 MG RE SUPP: 650.0000 mg | Freq: Four times a day (QID) | RECTAL | Status: DC | PRN

## 2023-06-03 MED ORDER — SODIUM CHLORIDE 0.9 % IV SOLN
INTRAVENOUS | Status: AC
Start: 2023-06-03 — End: 2023-06-03

## 2023-06-03 MED ORDER — MORPHINE SULFATE ER 15 MG PO TBCR
30.0000 mg | EXTENDED_RELEASE_TABLET | Freq: Two times a day (BID) | ORAL | Status: DC
  Administered 2023-06-04 – 2023-06-06 (×5): 30 mg via ORAL
  Filled 2023-06-03 (×5): qty 2

## 2023-06-03 MED ORDER — PIPERACILLIN-TAZOBACTAM 3.375 G IVPB 30 MIN
3.3750 g | Freq: Once | INTRAVENOUS | Status: AC
  Administered 2023-06-03: 3.375 g via INTRAVENOUS
  Filled 2023-06-03: qty 50

## 2023-06-03 MED ORDER — SODIUM CHLORIDE 0.9 % IV SOLN
1.0000 g | INTRAVENOUS | Status: DC
  Administered 2023-06-04 – 2023-06-06 (×3): 1 g via INTRAVENOUS
  Filled 2023-06-03 (×3): qty 10

## 2023-06-03 MED ORDER — VANCOMYCIN HCL 1750 MG/350ML IV SOLN
1750.0000 mg | Freq: Once | INTRAVENOUS | Status: AC
  Administered 2023-06-03: 1750 mg via INTRAVENOUS
  Filled 2023-06-03: qty 350

## 2023-06-03 NOTE — H&P (Signed)
 History and Physical      [name] LOV:[id number] DOB: 11-21-1965 DOA: 06/03/2023; DOS: 06/03/2023  PCP: Joycelyn Rua, MD *** Patient coming from: home ***  I have personally briefly reviewed patient's old medical records in [facility]  Chief Complaint: ***  HPI: [name] is a 58 y.o. male with medical history significant for *** who is admitted to [facility] on 06/03/2023 with *** after presenting from home*** to [facility] ED complaining of ***.   ***        ***  ED Course:  Vital signs in the ED were notable for the following: ***  Labs were notable for the following: ***  Per my interpretation, EKG in ED demonstrated the following:  ***  Imaging in the ED, per corresponding formal radiology read, was notable for the following: ***  While in the ED, the following were administered: ***  Subsequently, the patient was admitted  ***  ***red   Review of Systems: As per HPI otherwise 10 point review of systems negative.   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   History of hiatal hernia    Hyperlipidemia    Hypertension    Diagnostic exercise tolerance test assessment:04/30/2010 : comments normal -no evidence os ischemia by ST analysis   lipoma    Lipoma 01/26/2019   Mitral regurgitation    Echo 12/2018: EF 60-65, myxomatous mitral valve, severe mitral regurgitation, partial flail leaflet of posterior mitral valve, myxomatous tricuspid valve with trivial TR, ascending aorta  mildly dilated (39 mm)   Pneumonia    as child   PONV (postoperative nausea and vomiting)    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   Complex valvuloplasty including artificial Gore-tex neochord placement x6 with 28 mm Sorin Memo 4D ring annuloplasty via right mini thoracotomy approach   Solitary kidney    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    Past Surgical History:  Procedure Laterality Date   ABDOMINAL AORTOGRAM N/A 02/10/2020   Procedure: ABDOMINAL AORTOGRAM;  Surgeon: Corky Crafts, MD;  Location: [facility] INVASIVE CV LAB;  Service: Cardiovascular;  Laterality: N/A;   APPLICATION OF WOUND VAC  08/15/2021   Procedure: APPLICATION OF WOUND VAC;  Surgeon: Emelia Loron, MD;  Location: [facility] OR;  Service: General;;   BIOPSY  01/07/2019   Procedure: BIOPSY;  Surgeon: Charlott Rakes, MD;  Location: [facility] ENDOSCOPY;  Service: Endoscopy;;   BIOPSY  01/03/2020   Procedure: BIOPSY;  Surgeon: Charlott Rakes, MD;  Location: [facility] ENDOSCOPY;  Service: Endoscopy;;   COLON SURGERY  03/04/2019   left colon segmental resection   COLONOSCOPY WITH PROPOFOL N/A 01/07/2019   Procedure: COLONOSCOPY WITH PROPOFOL;  Surgeon: Charlott Rakes, MD;  Location: [facility] ENDOSCOPY;  Service: Endoscopy;  Laterality: N/A;   COLONOSCOPY WITH PROPOFOL N/A 01/03/2020   Procedure: COLONOSCOPY WITH PROPOFOL;  Surgeon: Charlott Rakes,  MD;  Location: [facility] ENDOSCOPY;  Service: Endoscopy;  Laterality: N/A;   INCISION AND DRAINAGE OF WOUND Right 08/13/2021   Procedure: IRRIGATION AND DEBRIDEMENT WOUND RIGHT GROIN;  Surgeon: Axel Filler, MD;  Location: [facility] OR;  Service: General;  Laterality: Right;   INGUINAL HERNIA REPAIR Left 04/23/2016   Procedure: LAPAROSCOPIC  INGUINAL REPAIR;  Surgeon: Berna Bue, MD;  Location: [facility] OR;  Service: General;  Laterality: Left;   IR IMAGING GUIDED PORT INSERTION  05/08/2022   IRRIGATION AND DEBRIDEMENT ABSCESS Right 08/15/2021   Procedure:  IRRIGATION AND DEBRIDEMENT OF GROIN;  Surgeon: Emelia Loron, MD;  Location: [facility] OR;  Service: General;  Laterality: Right;   IRRIGATION AND DEBRIDEMENT SEBACEOUS CYST N/A 03/19/2023   Procedure: DEBRIDEMENT OF CHRONIC INFECTION;  Surgeon: Romie Levee, MD;  Location: [facility] ;  Service: General;  Laterality: N/A;   KNEE SURGERY     arthroscopic left knee   LIPOMA EXCISION  2015   x20 [facility]   MITRAL VALVE REPAIR Right 05/09/2020   Procedure: MINIMALLY INVASIVE MITRAL VALVE REPAIR (MVR) USING LIVANOVA MEMO 4D MITRAL RING;  Surgeon: Purcell Nails, MD;  Location: [facility] OR;  Service: Open Heart Surgery;  Laterality: Right;   POLYPECTOMY  01/03/2020   Procedure: POLYPECTOMY;  Surgeon: Charlott Rakes, MD;  Location: [facility] ENDOSCOPY;  Service: Endoscopy;;   PORT-A-CATH REMOVAL     PORTACATH PLACEMENT N/A 03/16/2019   Procedure: INSERTION PORT-A-CATH;  Surgeon: Karie Soda, MD;  Location: [facility] ORS;  Service: General;  Laterality: N/A;   RIGHT/LEFT HEART CATH AND CORONARY ANGIOGRAPHY N/A 02/10/2020   Procedure: RIGHT/LEFT HEART CATH AND CORONARY ANGIOGRAPHY;  Surgeon: Corky Crafts, MD;  Location: [facility] INVASIVE CV LAB;  Service: Cardiovascular;  Laterality: N/A;   SUBMUCOSAL INJECTION  01/07/2019   Procedure: SUBMUCOSAL INJECTION;  Surgeon: Charlott Rakes, MD;  Location: [facility] ENDOSCOPY;  Service: Endoscopy;;   TEE WITHOUT CARDIOVERSION N/A 02/10/2020   Procedure: TRANSESOPHAGEAL ECHOCARDIOGRAM (TEE);  Surgeon: Elease Hashimoto Deloris Ping, MD;  Location: [facility] ENDOSCOPY;  Service: Cardiovascular;  Laterality: N/A;  CATH AFTER TEE   TEE WITHOUT CARDIOVERSION N/A 05/09/2020   Procedure: TRANSESOPHAGEAL ECHOCARDIOGRAM (TEE);  Surgeon: Purcell Nails, MD;  Location: [facility] OR;  Service: Open Heart Surgery;  Laterality: N/A;   THYROIDECTOMY, PARTIAL  01/05/2009   Right Thyroid Lobectomy   TONSILLECTOMY     TOTAL NEPHRECTOMY Left 1970   Wilms Tumor left kidney excised as an infant     Social History:  reports that he has never smoked. He has never used smokeless tobacco. He reports that he does not currently use alcohol after a past usage of about 1.0 standard drink of alcohol per week. He reports that he does not use drugs.   Allergies  Allergen Reactions   Cat Dander Anaphylaxis   Empagliflozin Other (See Comments)    Recurrent UTI    Family History  Problem Relation Age of Onset   Heart disease Father    Hernia Father    Healthy Sister    Stroke Paternal Grandmother    Cancer Mother        THYROID   Hypotension Mother    Heart attack Maternal Grandfather    Congestive Heart Failure Maternal Grandfather    Heart attack Paternal Grandfather    Colon cancer Maternal Aunt 79   Congestive Heart Failure Paternal Uncle    Congestive Heart Failure Maternal Grandmother    Colon cancer Other 59       MGMs brother  Colon cancer Other        MGFs brother    Family history reviewed and not pertinent ***   Prior to Admission medications   Medication Sig Start Date End Date Taking? Authorizing Provider  acetaminophen (TYLENOL) 500 MG tablet Take 1 tablet (500 mg total) by mouth daily as needed for mild pain. 12/29/21   Redwine, Madison A, PA-C  albuterol (PROVENTIL HFA;VENTOLIN HFA) 108 (90 BASE) MCG/ACT inhaler Inhale 1-2 puffs into the lungs every 6 (six) hours as needed for wheezing or shortness of breath. Patient not taking: Reported on 05/06/2023    [provider]  amoxicillin-clavulanate (AUGMENTIN) 500-125 MG tablet Take 1 tablet by mouth 3 (three) times daily for 7 days. 06/03/23 06/10/23  Rana Snare, NP  aspirin EC 81 MG EC tablet Take 1 tablet (81 mg total) by mouth daily. Swallow whole. 05/15/20   Leary Roca, PA-C  atorvastatin (LIPITOR) 10 MG tablet Take 1 tablet (10 mg total) by mouth daily. 03/15/14   Angiulli, Mcarthur Rossetti, PA-C  calcium carbonate (TUMS EX) 750 MG chewable tablet Chew 1,500 mg by mouth daily as needed for  heartburn.    [provider]  dabigatran (PRADAXA) 150 MG CAPS capsule Take 150 mg by mouth 2 (two) times daily.    [provider]  dexamethasone (DECADRON) 4 MG tablet Take 1 tablet (4 mg total) by mouth daily. Start on 05/15/2023 05/06/23   Ladene Artist, MD  HYDROmorphone (DILAUDID) 4 MG tablet Take 1-2 tablets (4-8 mg total) by mouth every 6 (six) hours as needed for up to 7 days for severe pain (pain score 7-10). 05/28/23 06/04/23  Briant Cedar, MD  ibuprofen (ADVIL) 200 MG tablet Take 200 mg by mouth daily as needed for mild pain.    [provider]  insulin glargine, 1 Unit Dial, (TOUJEO SOLOSTAR) 300 UNIT/ML Solostar Pen Inject 41 Units into the skin daily. 04/05/21   [provider]  lidocaine-prilocaine (EMLA) cream Apply 1 Application topically as needed.    [provider]  lisinopril (ZESTRIL) 5 MG tablet Take 5 mg by mouth daily. 05/14/23   [provider]  loratadine (CLARITIN) 10 MG tablet Take 10 mg by mouth daily as needed for allergies.    [provider]  metFORMIN (GLUCOPHAGE) 1000 MG tablet Take 1 tablet (1,000 mg total) by mouth 2 (two) times daily. 02/12/20   Corky Crafts, MD  metoprolol succinate (TOPROL-XL) 25 MG 24 hr tablet Take 25 mg by mouth daily.    [provider]  mometasone (ASMANEX) 220 MCG/INH inhaler Inhale 1 puff into the lungs daily.    [provider]  morphine (MS CONTIN) 30 MG 12 hr tablet Take 1 tablet (30 mg total) by mouth every 12 (twelve) hours for 14 days. 05/28/23 06/11/23  Briant Cedar, MD  naloxone [facility]) nasal spray 4 mg/0.1 mL For use if Opoid overdose is suspected 05/28/23   Briant Cedar, MD  Omega-3 Fatty Acids (FISH OIL PO) Take 1,000 mg by mouth 2 (two) times daily.    [provider]  ONE TOUCH ULTRA TEST test strip 1 each by Other route as needed (GLUCOSE).  05/06/15   [provider]  pioglitazone (ACTOS) 30 MG tablet  Take 30 mg by mouth daily.    [provider]  polyethylene glycol powder (GLYCOLAX/MIRALAX) 17 GM/SCOOP powder Dissolve 17 g in liquid and take by mouth daily. 05/28/23   Briant Cedar, MD  prochlorperazine (COMPAZINE) 10 MG tablet TAKE 1 TABLET BY MOUTH EVERY 6 HOURS AS NEEDED FOR NAUSEA OR VOMITING. 03/09/23   Ladene Artist, MD  repaglinide (PRANDIN) 2 MG tablet Take 2 mg by mouth 2 (two) times daily. 05/16/20   [provider]  senna-docusate (SENOKOT-S) 8.6-50 MG tablet Take 1 tablet by mouth at bedtime. 05/28/23   Briant Cedar, MD     Objective    Physical Exam: Vitals:   06/03/23 2143  BP: 113/87  Pulse: (!) 102  Resp: 16  Temp: 98.5 F (36.9 C)  TempSrc: Oral  SpO2: 98%    General: appears to be stated age; alert, oriented Skin: warm, dry, no rash Head:  AT/Ecru Mouth:  Oral mucosa membranes appear moist, normal dentition Neck: supple; trachea midline Heart:  RRR; did not appreciate any M/R/G Lungs: CTAB, did not appreciate any wheezes, rales, or rhonchi Abdomen: + BS; soft, ND, NT Vascular: 2+ pedal pulses b/l; 2+ radial pulses b/l Extremities: no peripheral edema, no muscle wasting Neuro: strength and sensation intact in upper and lower extremities b/l    *** Neuro: 5/5 strength of the proximal and distal flexors and extensors of the upper and lower extremities bilaterally; sensation intact in upper and lower extremities b/l; cranial nerves II through XII grossly intact; no pronator drift; no evidence suggestive of slurred speech, dysarthria, or facial droop; Normal muscle tone. No tremors. *** Neuro: In the setting of the patient's current mental status and associated inability to follow instructions, unable to perform full neurologic exam at this time.  As such, assessment of strength, sensation, and cranial nerves is limited at this time. Patient noted to spontaneously move all 4 extremities. No tremors.  ***    Labs on  Admission: I have personally reviewed following labs and imaging studies  CBC: Recent Labs  Lab 05/28/23 0259 06/03/23 0847  WBC 7.7 10.0  NEUTROABS 5.6 8.1*  HGB 9.0* 9.4*  HCT 27.9* 28.4*  MCV 100.4* 98.6  PLT 97* 123*   Basic Metabolic Panel: Recent Labs  Lab 05/28/23 0259 06/03/23 0847  NA 132* 129*  K 4.5 4.5  CL 101 94*  CO2 23 24  GLUCOSE 196* 204*  BUN 20 26*  CREATININE 0.67 0.71  CALCIUM 8.0* 8.4*   GFR: Estimated Creatinine Clearance: 111.1 mL/min (by C-G formula based on SCr of 0.71 mg/dL). Liver Function Tests: Recent Labs  Lab 06/03/23 0847  AST 22  ALT 9  ALKPHOS 933*  BILITOT 0.6  PROT 6.5  ALBUMIN 3.3*   No results for input(s): "LIPASE", "AMYLASE" in the last 168 hours. No results for input(s): "AMMONIA" in the last 168 hours. Coagulation Profile: No results for input(s): "INR", "PROTIME" in the last 168 hours. Cardiac Enzymes: No results for input(s): "CKTOTAL", "CKMB", "CKMBINDEX", "TROPONINI" in the last 168 hours. BNP (last 3 results) No results for input(s): "PROBNP" in the last 8760 hours. HbA1C: No results for input(s): "HGBA1C" in the last 72 hours. CBG: Recent Labs  Lab 05/28/23 0734  GLUCAP 132*   Lipid Profile: No results for input(s): "CHOL", "HDL", "LDLCALC", "TRIG", "CHOLHDL", "LDLDIRECT" in the last 72 hours. Thyroid Function Tests: No results for input(s): "TSH", "T4TOTAL", "FREET4", "T3FREE", "THYROIDAB" in the last 72 hours. Anemia Panel: Recent Labs    06/03/23 0847  FERRITIN 2,855*   Urine analysis:    Component Value Date/Time   COLORURINE YELLOW 07/22/2022 0822   APPEARANCEUR CLEAR 07/22/2022 0822   LABSPEC 1.026 07/22/2022 0822   PHURINE 6.0  07/22/2022 0822   GLUCOSEU NEGATIVE 07/22/2022 0822   HGBUR NEGATIVE 07/22/2022 0822   BILIRUBINUR NEGATIVE 07/22/2022 0822   KETONESUR NEGATIVE 07/22/2022 0822   PROTEINUR 30 (A) 06/03/2023 0847   UROBILINOGEN 0.2 08/17/2014 1831   NITRITE NEGATIVE 07/22/2022  0822   LEUKOCYTESUR NEGATIVE 07/22/2022 1308    Radiological Exams on Admission: No results found.    Assessment/Plan    Principal Problem:   Gluteal abscess  ***              ***                ***               ***               ***               ***              ***               ***               ***               ***               ***               ***               ***              ***     DVT prophylaxis: SCD's ***  Code Status: Full code*** Family Communication: none*** Disposition Plan: Per Rounding Team Consults called: none***;  Admission status: ***    I SPENT GREATER THAN 75 *** MINUTES IN CLINICAL CARE TIME/MEDICAL DECISION-MAKING IN COMPLETING THIS ADMISSION.     Chaney Born Saad Buhl DO [facility] From 7PM - 7AM   06/03/2023, 10:07 PM   ***

## 2023-06-03 NOTE — Patient Instructions (Signed)
 Fluids Given Through an IV (IV Infusion Therapy): What to Expect IV infusion therapy is a treatment to deliver a fluid, called an infusion, into a vein. You may have IV infusion to get: Fluids. Medicines. Nutrition. Chemotherapy. This is medicines to stop or slow down cancer cells. Blood or blood products. Dye that is given before an MRI or a CT scan. This is called contrast dye. Tell a health care provider about: Any allergies you have. This includes allergies to anesthesia or dyes. All medicines you take. These include vitamins, herbs, eye drops, and creams. Any bleeding problems you have. Any surgeries you've had, including if you've had lymph nodes taken out of your armpit or if you have a arteriovenous fistula for dialysis. Any medical problems you have. Whether you're pregnant or may be pregnant. Whether you've used IV drugs. What are the risks? Your health care provider will talk with you about risks. These may include: Pain, bruising, or bleeding. Infection. The IV leaking or moving out of place. Damage to blood vessels or nerves. Allergic reactions to medicines or dyes. A blood clot. An air bubble in the vein, also called an air embolism. What happens before the procedure? Eat and drink only as you've been told. Ask about changing or stopping: Any medicines you take. Any vitamins, herbs, or supplements you take. What happens during the procedure?     Placing the catheter Your skin at the IV site will be washed with fluid that kills germs. This will help prevent infection. IV infusion therapy starts with a procedure to place a soft tube called a catheter into a vein. An IV tube will be attached to the catheter to let the infusion flow into your blood. Your catheter may be placed: Into a vein that is usually in the bend of the elbow, forearm, or back of the hand. This is called a peripheral IV catheter. This may need to be put into a vein each time you get an  infusion. Into a vein near your elbow. This is called a midline catheter or a peripherally inserted central catheter (PICC). These types of catheters may stay in place for weeks or months at a time so you can receive repeated infusions through it. Into a vein near your neck that leads to your heart. This is called a non-tunneled catheter. This is only used for short amounts of time because it can cause infection. Through the skin of your chest and into a large vein that leads to your heart. This is called a tunneled catheter. This may stay in your body for months or years. Into an implanted port. An implanted port is a device that is surgically inserted under the skin of the chest to provide long-term IV access. The catheter will connect the port to a large vein in the chest or upper arm. A port may be kept in place for many months or years. Each time you have an infusion, a needle will be inserted through your skin to connect the catheter to the port. Doing the infusion To start the infusion, your provider will: Attach the IV tubing to your catheter. Use a tape or a bandage to hold the IV in place against your skin. An IV pump may be used to control the flow of the IV infusion. During the infusion, your provider will check the area to make sure: There is no bleeding, swelling, or pain. Your IV infusion is flowing correctly. After the infusion, your provider will: Take off the bandage  or tape. Disconnect the tubing from the catheter. Remove the catheter, if you have a peripheral IV. Apply pressure over the IV insertion site to stop bleeding, then cover the area with a bandage. If you have an implanted port, PICC, non-tunneled, or tunneled catheter, your catheter may remain in place. This depends on how many times you will need treatment, your medical condition, and what type of catheter you have. These steps may vary. Ask what you can expect. What can I expect after the procedure? You may be  watched closely until you leave. This includes checking your pain level, blood pressure, heart rate, and breathing rate. Your provider will check to make sure there are no signs of infection. Follow these instructions at home: Take your medicines only as told. Change or take off your bandage as told by your provider. Ask what things are safe for you to do at home. Ask when you can go back to work or school. Do not take baths, swim, or use a hot tub until you're told it's OK. Ask if you can shower. Check your IV insertion site every day for signs of infection. Check for: Redness, swelling, or pain. Fluid or blood. If fluid or blood drains from your IV site, use your hands to press down firmly on the area for a minute or two. Doing this should stop the bleeding. Warmth. Pus or a bad smell. Contact a health care provider if: You have signs of infection around your IV site. You have fluid or blood coming from your IV site that does not stop after you put pressure to the site. You have a rash or blisters. You have itchy, red, swollen areas of skin called hives. Get help right away if: You have a fever or chills. You have chest pain. You have trouble breathing. This information is not intended to replace advice given to you by your health care provider. Make sure you discuss any questions you have with your health care provider. Document Revised: 09/09/2022 Document Reviewed: 09/09/2022 Elsevier Patient Education  2024 [employer]Morphine Injection What is this medication? MORPHINE (MOR feen) treats severe pain. It is prescribed when other pain medications have not worked or cannot be tolerated. It works by blocking pain signals in the brain. It belongs to a group of medications called opioids. This medicine may be used for other purposes; ask your health care provider or pharmacist if you have questions. COMMON BRAND NAME(S): Astramorph PF, Duramorph, Duramorph PF, Infumorph, MITIGO What  should I tell my care team before I take this medication? They need to know if you have any of these conditions: Bleeding disorder Brain tumor Frequently drink alcohol Head injury Heart disease Low adrenal gland function Lung or breathing disease, such as asthma Seizures Stomach or intestine problems History of substance use disorder Take medications that treat or prevent blood clots Taken an MAOI, such as Marplan, Nardil, or Parnate in the last 14 days Trouble passing urine An unusual or allergic reaction to morphine, other medications, foods, dyes, or preservatives Pregnant or trying to get pregnant Breastfeeding How should I use this medication? This medication is injected into a muscle, vein, or under the skin. It is usually given by your care team in a hospital or clinic setting. It may also be given at home. If you get this medication at home, you will be taught how to prepare and give it. Use exactly as directed. Take it as directed on the prescription label. Do not take it more  often than directed. There may be unused or extra doses after you finish your treatment. Talk to your care team if you have questions about your dose. Always look at your medication before using it. Do not use the injection if its color is darker than pale yellow or if it is discolored in any other way. Do not use this medication if it is cloudy, thickened, colored, or has solid particles in it. It is important that you put your used needles and syringes in a special sharps container. Do not put them in a trash can. If you do not have a sharps container, call your pharmacist or care team to get one. Talk to your care team about the use of this medication in children. Special care may be needed. Overdosage: If you think you have taken too much of this medicine contact a poison control center or emergency room at once. NOTE: This medicine is only for you. Do not share this medicine with others. What if I miss a  dose? If you miss a dose, take it as soon as you can. If it is almost time for your next dose, take only that dose. Do not take double or extra doses. What may interact with this medication? Do not take this medication with any of the following: Linezolid MAOIs, such as Marplan, Nardil, and Parnate Methylene blue Samidorphan This medication may interact with the following: Alcohol Antihistamines for allergy, cough, and cold Atropine Certain medications for anxiety or sleep Certain medications for bladder problems, such as oxybutynin, tolterodine Certain medications for depression, such as amitriptyline, fluoxetine, sertraline, mirtazapine, trazodone Certain medications for migraine headache, such as almotriptan, eletriptan, frovatriptan, naratriptan, rizatriptan, sumatriptan, zolmitriptan Certain medications for nausea or vomiting, such as dolasetron, granisetron, ondansetron, palonosetron Certain medications for Parkinson disease, such as benztropine, trihexyphenidyl Certain medications for seizures, such as phenobarbital, primidone Certain medications for stomach problems, such as dicyclomine, hyoscyamine Certain medications for travel sickness, such as scopolamine Clopidogrel Diuretics General anesthetics, such as halothane, isoflurane, methoxyflurane, propofol Ipratropium Medications that relax muscles Other opioid medications for pain or cough Phenothiazines, such as chlorpromazine, mesoridazine, prochlorperazine, thioridazine Prasugrel Ticagrelor This list may not describe all possible interactions. Give your health care provider a list of all the medicines, herbs, non-prescription drugs, or dietary supplements you use. Also tell them if you smoke, drink alcohol, or use illegal drugs. Some items may interact with your medicine. What should I watch for while using this medication? Tell your care team if your pain does not go away, if it gets worse, or if you have new or a  different type of pain. You may develop tolerance to this medication. Tolerance means that you will need a higher dose of the medication for pain relief. Tolerance is normal and is expected if you take this medication for a long time. Taking this medication with other substances that cause drowsiness, such as alcohol, benzodiazepines, or other opioids can cause serious side effects. Give your care team a list of all medications you use. They will tell you how much medication to take. Do not take more medication than directed. Call emergency services if you have problems breathing or staying awake. Children may be at higher risk for side effects. Stop giving this medication and call emergency services right away if your child has slow or noisy breathing, has confusion, is unusually sleepy, or not able to wake up. Long term use of this medication may cause your brain and body to depend on it. This  can happen even when used as directed by your care team. You and your care team will work together to determine how long you will need to take this medication. If your care team wants you to stop this medication, the dose will be slowly lowered over time to reduce the risk of side effects. Naloxone is an emergency medication used for an opioid overdose. An overdose can happen if you take too much of an opioid. It can also happen if an opioid is taken with some other medications or substances such as alcohol. Know the symptoms of an overdose, such as trouble breathing, unusually tired or sleepy, or not being able to respond or wake up. Make sure to tell caregivers and close contacts where your naloxone is stored. Make sure they know how to use it. After naloxone is given, the person giving it must call emergency services. Naloxone is a temporary treatment. Repeat doses may be needed. This medication may affect your coordination, reaction time, or judgment. Do not drive or operate machinery until you know how this  medication affects you. Sit up or stand slowly to reduce the risk of dizzy or fainting spells. Drinking alcohol with this medication can increase the risk of these side effects. This medication will cause constipation. If you do not have a bowel movement for 3 days, call your care team. Your mouth may get dry. Chewing sugarless gum or sucking hard candy and drinking plenty of water may help. Contact your care team if the problem does not go away or is severe. Talk to your care team if you may be pregnant. Prolonged use of this medication during pregnancy can cause temporary withdrawal in a newborn. Talk to your care team before breastfeeding. Changes to your treatment plan may be needed. If you breastfeed while taking this medication, seek medical care right away if you notice the child has slow or noisy breathing, is unusually sleepy or not able to wake up, or is limp. Long-term use of this medication may cause infertility. Talk to your care team if you are concerned about your fertility. What side effects may I notice from receiving this medication? Side effects that you should report to your care team as soon as possible: Allergic reactions--skin rash, itching, hives, swelling of the face, lips, tongue, or throat CNS depression--slow or shallow breathing, shortness of breath, feeling faint, dizziness, confusion, difficulty staying awake Low adrenal gland function--nausea, vomiting, loss of appetite, unusual weakness or fatigue, dizziness Low blood pressure--dizziness, feeling faint or lightheaded, blurry vision Side effects that usually do not require medical attention (report to your care team if they continue or are bothersome): Constipation Dizziness Drowsiness Dry mouth Headache Nausea Vomiting This list may not describe all possible side effects. Call your doctor for medical advice about side effects. You may report side effects to FDA at 1-800-FDA-1088. Where should I keep my  medication? Keep out of the reach of children and pets. This medication can be abused. Keep it in a safe place to protect it from theft. Do not share this medication with anyone. Selling or giving away this medication is dangerous and is against the law. If you are using this medication at home, you will be instructed on how to store this medication. Throw away any unused medication after the expiration date on the label. Discard unused medication and used packaging carefully. Children and pets can be harmed if they find used or lost packages. NOTE: This sheet is a summary. It may not cover  all possible information. If you have questions about this medicine, talk to your doctor, pharmacist, or health care provider.  2024 Elsevier/Gold Standard (2022-04-17 00:00:00)

## 2023-06-03 NOTE — Telephone Encounter (Signed)
 Direct Admission Request from Dr. [facility]  58 yo with hx metastatic colon cancer, T2DM, discitis who was recently discharged after being admitted for bilateral lower extremity weakness and back pain.  Had extensive workup including MRI brain, C, T, and L spine as well as LP.  Was discharged with pain management and plan for outpatient follow up with oncology, radiation oncology and neurology (if cytology negative, for autoimmune panels and EMG).  He was seen in clinic today by oncology.  Had cysts noted in suprapubic region and upper gluteal fold.  He'd expressed pus earlier in the day.  He was reportedly ill appearing in clinic, concern for skin/soft tissue infection.  Received zosyn and IVF bolus.  Admission requested for perineal/gluteal abscess.  Vitals in clinic notable for tachycardia 110 (BP 129/84, temp 98.1, RR 18).  Labs notable for mild hyponatremia, hyperglycemia, mild hypocalcemia, elevated alkaline phosphatase, elevated ferritin, elevated CRP.  WBC is wnl.  Chronic anemia and thrombocytopenia. Plan to admit to med tele at [facility] (if issues with getting bed - long expected wait time, encouraged oncology to let patient know he should go to ED).

## 2023-06-03 NOTE — Progress Notes (Signed)
 [facility] OFFICE PROGRESS NOTE   Diagnosis: Colon cancer  INTERVAL HISTORY:   [name] returns as scheduled.  He feels very weak.  For the last few days he has noted maroon-colored stool with bowel movements.  Stool has been looser.  He feels "exhausted".  He is nauseated.  No vomiting.  He has pain in multiple locations.  He is taking MS Contin every 12 hours, Dilaudid 1-2 times a day.  He has persistent leg weakness left greater than right.  He has dyspnea on exertion.  No fever.  He reports "cysts" at the left suprapubic region and upper gluteal fold have recurred.  He expressed "pus" from the lesion at the left suprapubic region earlier this morning.  Objective:  Vital signs in last 24 hours:  Blood pressure 129/84, pulse (!) 110, temperature 98.1 F (36.7 C), temperature source Temporal, resp. rate 18, height 5\' 11"  (1.803 m), weight 182 lb 8 oz (82.8 kg), SpO2 100%.   Ill-appearing.  No acute distress. HEENT: No thrush or ulcers. Resp: Lungs clear bilaterally. Cardio: Regular rate and rhythm. GI: No hepatosplenomegaly.  Rectal exam unremarkable, stool brown in color. Vascular: No leg edema. Neuro: Moves all extremities.  Leg weakness proximally left greater than right. Skin: Diaphoretic.  The previous surgical scar at the left suprapubic region is indurated, bloody drainage expressed.  There is induration, tenderness at the low sacrum extending to the upper gluteal fold, 2 openings in this area.  Brown foul-smelling drainage expressed from the lower opening.  Lab Results:  Lab Results  Component Value Date   WBC 10.0 06/03/2023   HGB 9.4 (L) 06/03/2023   HCT 28.4 (L) 06/03/2023   MCV 98.6 06/03/2023   PLT 123 (L) 06/03/2023   NEUTROABS 8.1 (H) 06/03/2023    Imaging:  No results found.  Medications: I have reviewed the patient's current medications.  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, status post a left colectomy 03/04/2019 Mass felt to be in the transverse colon on a colonoscopy 01/07/2019 with a biopsy confirming poorly differentiated adenocarcinoma with signet ring cell features, mismatch repair protein expression normal CT abdomen/pelvis 01/08/2019 12.3 cm indeterminate splenic mass, present in 2015 on a lumbar MRI-felt to be benign, small subpleural and pleural-based nodules at the lung bases, status post left nephrectomy CT chest 01/20/2019-small bilateral pulmonary nodules with rounded parenchymal nodules in the right lower lobe, indeterminate splenic mass CT chest 03/18/2019-multiple subcentimeter pulmonary nodules again seen bilaterally, greatest in the lower lobes, without significant change.  No new or enlarging pulmonary nodules or masses.  Partially visualized large heterogeneous splenic mass with no significant change.  Plan for follow-up chest CT at a 45-month interval. Cycle 1 FOLFOX 03/21/2019 Cycle 2 FOLFOX 04/04/2019 Cycle 3 FOLFOX 04/18/2019 (oxaliplatin dose reduced secondary to thrombocytopenia) Cycle 4 FOLFOX 05/02/2019 (oxaliplatin held secondary to thrombocytopenia) Cycle 5 FOLFOX 05/16/2019 Cycle 6 FOLFOX 05/30/2019 (oxaliplatin held secondary to thrombocytopenia) CT chest 06/08/2019-stable scattered small solid pulmonary nodules  Cycle 7 FOLFOX 06/13/2019 Cycle 8 FOLFOX 06/27/2019 Cycle 9 FOLFOX 07/11/2019 Cycle 10 FOLFOX 07/25/2019 (oxaliplatin held due to thrombocytopenia) Cycle 11 FOLFOX 08/08/2019 Cycle 12 FOLFOX 08/22/2019 CTs 01/09/2020-no evidence of recurrent disease, stable lung nodules favored to represent subpleural lymph nodes-dedicated follow-up not recommended CTs 01/11/2021-no evidence of recurrent disease CTs 01/24/2022-no evidence of recurrent disease CT lumbar spine, abdomen/pelvis 04/11/2022-numerous mixed lytic and sclerotic lesions scattered throughout the visualized axial and  appendicular  skeleton including several areas in the lumbar spine.   04/21/2022 CT biopsy sclerotic lesion right anterior iliac bone-consistent with metastatic colorectal adenocarcinoma, moderate to poorly differentiated.  Foundation 1-microsatellite stable, tumor mutation burden 4, K-ras G12S PET scan 05/09/2022-widespread hypermetabolic mixed faintly lytic and sclerotic osseous metastases throughout the axial and proximal appendicular skeleton; associated mild pathologic compression fracture at L3; no hypermetabolic extraosseous metastatic disease. Cycle 1 FOLFIRI/bevacizumab 05/26/2022 Cycle 2 FOLFIRI/bevacizumab 06/09/2022 Cycle 3 FOLFIRI 06/24/2022, bevacizumab 06/26/2022 Cycle 4 FOLFIRI 07/08/2022, bevacizumab held due to proteinuria Cycle 5 FOLFIRI 07/22/2022, bevacizumab held due to proteinuria CTs 07/31/2022-potentially areas of sclerosis represent interval healing; new lytic lesion at T12, multiple new areas of punched-out sclerosis throughout the visualized osseous structures.  Index lesion in the left iliac wing measures 1.3 x 2.8 cm.  Healing of previously metabolically occult metastatic disease on 05/09/2022 is a possibility. Cycle 6 FOLFIRI 08/04/2022, irinotecan dose reduced due to thrombocytopenia, bevacizumab held pending 24-hour urine result Cycle 7 FOLFIRI 08/18/2022, bevacizumab held secondary to proteinuria, G-CSF held Cycle 8 FOLFIRI 09/01/2022, bevacizumab held, G-CSF Cycle 9 FOLFIRI 09/16/2022, bevacizumab held, G-CSF CTs 09/29/2022-enlargement of a hypodense lesion posterior left lobe of the liver, similar diffuse mixed lytic and sclerotic osseous metastatic disease, multiple new small nodules medial right lung base, multiple other small bilateral pulmonary nodules unchanged. Cycle 10 FOLFIRI plus Avastin 10/08/2022 Cycle 11 FOLFIRI 10/27/2022, Avastin held Cycle 12 FOLFIRI/Avastin 11/11/2022 Cycle 13 FOLFIRI/Avastin 11/25/2022 CTs 12/08/2022-stable lesion posterior left lobe of the liver.  Unchanged  diffuse lytic and sclerotic osseous metastatic disease.  Stable small solid pulmonary nodules.  Stable splenic lesions.  Slightly increased bladder wall thickening. Cycle 14 FOLFIRI/Avastin 12/10/2022 Cycle 15 FOLFIRI/Avastin 12/24/2022 12/31/2022 24-hour urine protein 710 mg Cycle 16 FOLFIRI/Avastin 01/07/2023 Cycle 17 FOLFIRI 01/21/2023, Avastin held secondary to persistent proteinuria, slight increase in creatinine, and drainage from the suprapubic lesion CTs 01/26/2023-unchanged widespread sclerotic osseous metastatic disease, unchanged hypodense liver lesions, occasional small bilateral lung nodules unchanged. Cycle 18 FOLFIRI 02/03/2023, Avastin held Cycle 19 FOLFIRI 02/17/2023, Avastin held Cycle 20 FOLFIRI 03/03/2023, Avastin held Cycle 21 FOLFIRI 03/16/2023, Avastin held CTs 04/04/2023: Right increased sclerosis of osseous metastatic disease with new sclerotic lesions of the sternum/manubrium and increase sclerotic metastases throughout the pelvis, unchanged hypodense liver lesions Cycle 22 FOLFIRI 04/07/2023, Avastin held Cycle 23 FOLFIRI/Avastin 04/22/2023 Cycle 24 FOLFIRI/Avastin 05/06/2023   Multiple colon polyps-ascending colon polyps on the colonoscopy 01/07/2019-not removed Colonoscopy 01/03/2020-multiple polyps removed, tubular adenomas, inflammatory and hyperplastic polyps Wilms tumor at age 84, 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  Left nephrectomy at age 68 12.  Port-A-Cath placement, Dr. Michaell Cowing, 03/16/2019 13.  Thrombocytopenia secondary to chemotherapy-oxaliplatin dose reduced beginning with cycle 3 FOLFOX, improved 14.  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  Minimally invasive  mitral valve repair 05/09/2020 16.  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  Right groin soft tissue infection May 2023-status postsurgical debridement 18.  Left leg DVT-common femoral, femoral, popliteal, posterior tibial, and peroneal veins 19.  02/04/2022-MRI cervical spine-1.7 cm T1 lesion-indeterminate; bone scan 03/14/2022-focal intense activity at approximate T1 level felt to correspond to the lesion on MRI, additional foci of activity involving the lower thoracic and lumbar spine, right iliac bone and pubic symphysis.  CT lumbar spine, abdomen/pelvis  04/11/2022-numerous mixed lytic and sclerotic lesions scattered throughout the visualized axial and appendicular skeleton including several areas in the lumbar spine.  04/21/2022 CT biopsy sclerotic lesion right anterior iliac bone-consistent with metastatic colorectal adenocarcinoma, moderate to poorly differentiated 20.  Inflamed sebaceous cyst 08/18/2022-doxycycline Surgical resection of area of "hidradenitis " 03/19/2023 21.  Admission 05/24/2023 with increased back pain and new onset leg weakness MRI thoracic and lumbar spine 05/24/2023: Fuhs osseous metastatic disease, no acute compression fracture, normal cord signal but no spinal canal stenosis, no abnormal epidural soft tissue MRI cervical spine 05/25/2023: Diffuse osseous metastatic disease, small amount of extraosseous/epidural tumor at the right ventral epidural space at T1 without stenosis, no pathologic compression fracture MRI brain 05/25/2023: 2 punctate foci of enhancement in the left parieto-occipital region-tiny metastases suspected, osseous metastatic disease Lumbar puncture 05/27/2023-cytology negative for malignant cells    Disposition: [name] has metastatic colon cancer with extensive metastatic disease involving bone.  He was recently seen at [facility].  Retreatment with FOLFOX recommended.  We reviewed potential side effects associated  with oxaliplatin including increased chance of an allergic reaction when retreated with oxaliplatin, the various forms of neuropathy.  We discussed the nausea associated with chemotherapy.  We discussed potential side effects associated with 5-fluorouracil including mouth sores, diarrhea, hand-foot syndrome, skin rash, increased sensitivity to sun, skin hyperpigmentation.  He agrees with this plan.  Start date to be determined.  He is ill-appearing in the office today.  The cystic type lesions at the left pubic region and sacrum/upper gluteal fold may be infected.  We have recommended hospitalization for further evaluation.  He is in agreement.  He will receive a dose of Zosyn in the office today while we are waiting on a bed assignment.  Patient seen with [name].    Lonna Cobb ANP/GNP-BC   06/03/2023  9:53 AM  This was a shared visit with Lonna Cobb.  [name] was interviewed and examined.  His clinical status continues to decline.  He was seen at [facility] on 06/01/2023.  Dr. Roselyn Bering contacted me and recommends FOLFOX chemotherapy.  [name] may be a clinical trial candidate if the PSA is stable.  He presents today with generalized weakness.  There is foul-smelling drainage from a suprapubic cyst and at the upper gluteal fold.  A rectal exam reveals no mass and brown stool.  There is foul-smelling bloody drainage from the upper perineum and gluteal fold.  I recommend hospital admission for surgical consultation of the apparent perineal/gluteal abscess.  He will begin antibiotics and IV fluids at the cancer center while waiting on hospital admission.  The plan is to consider FOLFOX chemotherapy depending on his clinical status over the next few days.  I will check on him 06/04/2023 and outpatient follow-up will be scheduled at the Cancer center.  I was present for greater than 50% of today's visit.  I performed medical decision making.  Mancel Bale, MD

## 2023-06-03 NOTE — Progress Notes (Signed)
 Pharmacy Antibiotic Note  [name] is a 58 y.o. male admitted on 06/03/2023 with perineal/gluteal abscess. .  Pharmacy has been consulted for vancomycin dosing.  Plan: Vancomycin 1750mg  IV x 1 then 1500mg  q12h (AUC 520.8, Scr 0.8) Follow renal function and clinical course     Temp (24hrs), Avg:98.3 F (36.8 C), Min:98.1 F (36.7 C), Max:98.5 F (36.9 C)  Recent Labs  Lab 05/28/23 0259 06/03/23 0847  WBC 7.7 10.0  CREATININE 0.67 0.71    Estimated Creatinine Clearance: 111.1 mL/min (by C-G formula based on SCr of 0.71 mg/dL).    Allergies  Allergen Reactions   Cat Dander Anaphylaxis   Empagliflozin Other (See Comments)    Recurrent UTI    Antimicrobials this admission: 3/5 vanc >> 3/5 CTX >> 3/5 flagyl >>  Dose adjustments this admission:   Microbiology results:   Thank you for allowing pharmacy to be a part of this patient's care.  Arley Phenix RPh 06/03/2023, 10:25 PM

## 2023-06-03 NOTE — Telephone Encounter (Signed)
 Pt called to inform us that she received her medication today 06/03/23 instead of 06/02/23 Pt stated she was informed by Misty Stanley T NP if she didn't get her medication she should call  to change her appointments. Pt was informed there will be no changes to her appointments

## 2023-06-04 ENCOUNTER — Encounter (HOSPITAL_COMMUNITY): Payer: Self-pay | Admitting: Internal Medicine

## 2023-06-04 ENCOUNTER — Inpatient Hospital Stay (HOSPITAL_COMMUNITY)

## 2023-06-04 DIAGNOSIS — D696 Thrombocytopenia, unspecified: Secondary | ICD-10-CM | POA: Diagnosis present

## 2023-06-04 DIAGNOSIS — J4530 Mild persistent asthma, uncomplicated: Secondary | ICD-10-CM | POA: Diagnosis present

## 2023-06-04 DIAGNOSIS — D638 Anemia in other chronic diseases classified elsewhere: Secondary | ICD-10-CM | POA: Diagnosis present

## 2023-06-04 DIAGNOSIS — R531 Weakness: Secondary | ICD-10-CM

## 2023-06-04 DIAGNOSIS — L0231 Cutaneous abscess of buttock: Secondary | ICD-10-CM | POA: Diagnosis not present

## 2023-06-04 DIAGNOSIS — Z86718 Personal history of other venous thrombosis and embolism: Secondary | ICD-10-CM

## 2023-06-04 LAB — CBC WITH DIFFERENTIAL/PLATELET
Abs Immature Granulocytes: 0.31 10*3/uL — ABNORMAL HIGH (ref 0.00–0.07)
Basophils Absolute: 0 10*3/uL (ref 0.0–0.1)
Basophils Relative: 0 %
Eosinophils Absolute: 0.1 10*3/uL (ref 0.0–0.5)
Eosinophils Relative: 2 %
HCT: 25.3 % — ABNORMAL LOW (ref 39.0–52.0)
Hemoglobin: 7.8 g/dL — ABNORMAL LOW (ref 13.0–17.0)
Immature Granulocytes: 5 %
Lymphocytes Relative: 5 %
Lymphs Abs: 0.4 10*3/uL — ABNORMAL LOW (ref 0.7–4.0)
MCH: 32 pg (ref 26.0–34.0)
MCHC: 30.8 g/dL (ref 30.0–36.0)
MCV: 103.7 fL — ABNORMAL HIGH (ref 80.0–100.0)
Monocytes Absolute: 0.9 10*3/uL (ref 0.1–1.0)
Monocytes Relative: 13 %
Neutro Abs: 5.2 10*3/uL (ref 1.7–7.7)
Neutrophils Relative %: 75 %
Platelets: 105 10*3/uL — ABNORMAL LOW (ref 150–400)
RBC: 2.44 MIL/uL — ABNORMAL LOW (ref 4.22–5.81)
RDW: 18.6 % — ABNORMAL HIGH (ref 11.5–15.5)
WBC: 7 10*3/uL (ref 4.0–10.5)
nRBC: 0.4 % — ABNORMAL HIGH (ref 0.0–0.2)

## 2023-06-04 LAB — GLUCOSE, CAPILLARY
Glucose-Capillary: 92 mg/dL (ref 70–99)
Glucose-Capillary: 128 mg/dL — ABNORMAL HIGH (ref 70–99)
Glucose-Capillary: 103 mg/dL — ABNORMAL HIGH (ref 70–99)
Glucose-Capillary: 222 mg/dL — ABNORMAL HIGH (ref 70–99)
Glucose-Capillary: 224 mg/dL — ABNORMAL HIGH (ref 70–99)

## 2023-06-04 LAB — OLIGOCLONAL BANDS, CSF + SERM

## 2023-06-04 LAB — COMPREHENSIVE METABOLIC PANEL
ALT: 11 U/L (ref 0–44)
AST: 21 U/L (ref 15–41)
Albumin: 2.1 g/dL — ABNORMAL LOW (ref 3.5–5.0)
Alkaline Phosphatase: 626 U/L — ABNORMAL HIGH (ref 38–126)
Anion gap: 11 (ref 5–15)
BUN: 20 mg/dL (ref 6–20)
CO2: 21 mmol/L — ABNORMAL LOW (ref 22–32)
Calcium: 7.6 mg/dL — ABNORMAL LOW (ref 8.9–10.3)
Chloride: 96 mmol/L — ABNORMAL LOW (ref 98–111)
Creatinine, Ser: 0.57 mg/dL — ABNORMAL LOW (ref 0.61–1.24)
GFR, Estimated: >60 mL/min (ref >60)
Glucose, Bld: 107 mg/dL — ABNORMAL HIGH (ref 70–99)
Potassium: 3.9 mmol/L (ref 3.5–5.1)
Sodium: 128 mmol/L — ABNORMAL LOW (ref 135–145)
Total Bilirubin: 0.7 mg/dL (ref 0.0–1.2)
Total Protein: 5.5 g/dL — ABNORMAL LOW (ref 6.5–8.1)

## 2023-06-04 LAB — PROSTATE-SPECIFIC AG, SERUM (LABCORP): Prostate Specific Ag, Serum: 7.4 ng/mL — ABNORMAL HIGH (ref 0.0–4.0)

## 2023-06-04 LAB — MAGNESIUM: Magnesium: 1.5 mg/dL — ABNORMAL LOW (ref 1.7–2.4)

## 2023-06-04 MED ORDER — ALBUTEROL SULFATE (2.5 MG/3ML) 0.083% IN NEBU: 2.5000 mg | INHALATION_SOLUTION | RESPIRATORY_TRACT | Status: DC | PRN

## 2023-06-04 MED ORDER — INSULIN ASPART 100 UNIT/ML IJ SOLN
0.0000 [IU] | Freq: Three times a day (TID) | INTRAMUSCULAR | Status: DC
  Administered 2023-06-04: 3 [IU] via SUBCUTANEOUS
  Administered 2023-06-05 (×2): 2 [IU] via SUBCUTANEOUS
  Administered 2023-06-06: 1 [IU] via SUBCUTANEOUS

## 2023-06-04 MED ORDER — INSULIN GLARGINE 100 UNIT/ML ~~LOC~~ SOLN
20.0000 [IU] | Freq: Every morning | SUBCUTANEOUS | Status: DC
  Administered 2023-06-04 – 2023-06-06 (×3): 20 [IU] via SUBCUTANEOUS
  Filled 2023-06-04 (×3): qty 0.2

## 2023-06-04 MED ORDER — IPRATROPIUM-ALBUTEROL 0.5-2.5 (3) MG/3ML IN SOLN: 3.0000 mL | RESPIRATORY_TRACT | Status: DC | PRN

## 2023-06-04 MED ORDER — SENNOSIDES-DOCUSATE SODIUM 8.6-50 MG PO TABS
1.0000 | ORAL_TABLET | Freq: Every day | ORAL | Status: DC
  Administered 2023-06-04 – 2023-06-05 (×2): 1 via ORAL
  Filled 2023-06-04 (×2): qty 1

## 2023-06-04 MED ORDER — METOPROLOL TARTRATE 5 MG/5ML IV SOLN: 5.0000 mg | INTRAVENOUS | Status: DC | PRN

## 2023-06-04 MED ORDER — ENSURE ENLIVE PO LIQD
237.0000 mL | Freq: Two times a day (BID) | ORAL | Status: DC
Start: 2023-06-04 — End: 2023-06-06
  Administered 2023-06-04 – 2023-06-06 (×4): 237 mL via ORAL

## 2023-06-04 MED ORDER — ATORVASTATIN CALCIUM 10 MG PO TABS
10.0000 mg | ORAL_TABLET | Freq: Every day | ORAL | Status: DC
  Administered 2023-06-04 – 2023-06-06 (×3): 10 mg via ORAL
  Filled 2023-06-04 (×3): qty 1

## 2023-06-04 MED ORDER — METOPROLOL SUCCINATE ER 25 MG PO TB24
25.0000 mg | ORAL_TABLET | Freq: Every day | ORAL | Status: DC
  Administered 2023-06-04 – 2023-06-06 (×3): 25 mg via ORAL
  Filled 2023-06-04 (×3): qty 1

## 2023-06-04 MED ORDER — GUAIFENESIN 100 MG/5ML PO LIQD: 5.0000 mL | ORAL | Status: DC | PRN

## 2023-06-04 MED ORDER — VANCOMYCIN HCL 1500 MG/300ML IV SOLN
1500.0000 mg | Freq: Two times a day (BID) | INTRAVENOUS | Status: DC
  Administered 2023-06-04 – 2023-06-06 (×5): 1500 mg via INTRAVENOUS
  Filled 2023-06-04 (×5): qty 300

## 2023-06-04 MED ORDER — IOHEXOL 300 MG/ML  SOLN
100.0000 mL | Freq: Once | INTRAMUSCULAR | Status: AC | PRN
  Administered 2023-06-04: 100 mL via INTRAVENOUS

## 2023-06-04 MED ORDER — HYDROMORPHONE HCL 4 MG PO TABS
4.0000 mg | ORAL_TABLET | Freq: Four times a day (QID) | ORAL | Status: DC | PRN
  Administered 2023-06-04: 4 mg via ORAL
  Filled 2023-06-04: qty 1

## 2023-06-04 MED ORDER — HYDROMORPHONE HCL 2 MG PO TABS
2.0000 mg | ORAL_TABLET | ORAL | Status: DC | PRN
  Administered 2023-06-04: 2 mg via ORAL
  Filled 2023-06-04: qty 1

## 2023-06-04 MED ORDER — HYDROMORPHONE HCL 1 MG/ML IJ SOLN
1.0000 mg | INTRAMUSCULAR | Status: DC | PRN
  Administered 2023-06-04 – 2023-06-06 (×12): 1 mg via INTRAVENOUS
  Filled 2023-06-04 (×12): qty 1

## 2023-06-04 MED ORDER — HYDROMORPHONE HCL 4 MG PO TABS
4.0000 mg | ORAL_TABLET | ORAL | Status: DC | PRN
Start: 2023-06-04 — End: 2023-06-06
  Administered 2023-06-04: 4 mg via ORAL
  Administered 2023-06-04: 8 mg via ORAL
  Administered 2023-06-05 (×4): 4 mg via ORAL
  Administered 2023-06-06: 8 mg via ORAL
  Administered 2023-06-06 (×2): 4 mg via ORAL
  Filled 2023-06-04: qty 1
  Filled 2023-06-04: qty 2
  Filled 2023-06-04 (×3): qty 1
  Filled 2023-06-04: qty 2
  Filled 2023-06-04: qty 1
  Filled 2023-06-04: qty 2
  Filled 2023-06-04: qty 1

## 2023-06-04 MED ORDER — TRAZODONE HCL 50 MG PO TABS
50.0000 mg | ORAL_TABLET | Freq: Every evening | ORAL | Status: DC | PRN
  Administered 2023-06-05: 50 mg via ORAL
  Filled 2023-06-04: qty 1

## 2023-06-04 MED ORDER — DABIGATRAN ETEXILATE MESYLATE 150 MG PO CAPS
150.0000 mg | ORAL_CAPSULE | Freq: Two times a day (BID) | ORAL | Status: DC
  Administered 2023-06-04 – 2023-06-05 (×2): 150 mg via ORAL
  Filled 2023-06-04 (×5): qty 1

## 2023-06-04 MED ORDER — MAGNESIUM SULFATE 4 GM/100ML IV SOLN
4.0000 g | Freq: Once | INTRAVENOUS | Status: AC
  Administered 2023-06-04: 4 g via INTRAVENOUS
  Filled 2023-06-04: qty 100

## 2023-06-04 MED ORDER — ADULT MULTIVITAMIN W/MINERALS CH
1.0000 | ORAL_TABLET | Freq: Every day | ORAL | Status: DC
  Administered 2023-06-04 – 2023-06-06 (×3): 1 via ORAL
  Filled 2023-06-04 (×3): qty 1

## 2023-06-04 MED ORDER — HYDRALAZINE HCL 20 MG/ML IJ SOLN: 10.0000 mg | INTRAMUSCULAR | Status: DC | PRN

## 2023-06-04 MED ORDER — BUDESONIDE 0.5 MG/2ML IN SUSP
0.5000 mg | Freq: Two times a day (BID) | RESPIRATORY_TRACT | Status: DC
  Administered 2023-06-04 – 2023-06-06 (×5): 0.5 mg via RESPIRATORY_TRACT
  Filled 2023-06-04 (×5): qty 2

## 2023-06-04 MED ORDER — HYDROMORPHONE HCL 1 MG/ML IJ SOLN
0.5000 mg | INTRAMUSCULAR | Status: DC | PRN
  Administered 2023-06-04 (×5): 0.5 mg via INTRAVENOUS
  Filled 2023-06-04 (×5): qty 0.5

## 2023-06-04 NOTE — Evaluation (Signed)
 Physical Therapy Evaluation Patient Details Name: [name] MRN: [id number] DOB: 07/09/1965 Today's Date: 06/04/2023  History of Present Illness  Pt is a 58 y.o. male presenting as a direct admission from oncology clinic with abscess involving the upper gluteal fold after presenting from home to oncology clinic. PMH significant for type 2 diabetes mellitus, metastatic colon cancer, chronic anemia associated baseline hemoglobin 9-12, chronic thrombocytopenia, hypertension, hyperlipidemia, history of DVT and chronic hyponatremia.   Clinical Impression  Pt is a 58 y.o. male with above HPI resulting in the deficits listed below (see PT Problem List). Pt typically modified independent with use of RW- intermittent assist required from wife when increased pain. Pt required up to MOD A for bed mobility with assist from spouse present in room from elevated bed height to simulate home environment. Pt performed sit to stand and step pivot to recliner with CGA. Pt politely deferred ambulation this date due to increased pain.  Pt will benefit from skilled PT to maximize functional mobility and increase independence.          If plan is discharge home, recommend the following: Help with stairs or ramp for entrance;Assist for transportation;Assistance with cooking/housework;A little help with walking and/or transfers;A little help with bathing/dressing/bathroom   Can travel by private vehicle        Equipment Recommendations None recommended by PT  Recommendations for Other Services       Functional Status Assessment Patient has had a recent decline in their functional status and demonstrates the ability to make significant improvements in function in a reasonable and predictable amount of time.     Precautions / Restrictions Precautions Precautions: Fall Restrictions Weight Bearing Restrictions Per Provider Order: No      Mobility  Bed Mobility Overal bed mobility: Needs Assistance Bed  Mobility: Supine to Sit     Supine to sit: Mod assist, Used rails     General bed mobility comments: HOB slightly elevated. Wife showing PT how she assists pt at home with B LEs to EOB and HHA to bring trunk to upright. Discussed use of log roll technique for comfort and ease/efficency which pt is familiar with.    Transfers Overall transfer level: Needs assistance Equipment used: Rolling walker (2 wheels) Transfers: Sit to/from Stand, Bed to chair/wheelchair/BSC Sit to Stand: Contact guard assist   Step pivot transfers: Contact guard assist       General transfer comment: Elevated bed height to simulate home environment. Pt usually uses step stool to get into bed. Pt able to take side seps over to recliner with CGA. Pt polietly deferred ambulation this date due to pain.    Ambulation/Gait                  Stairs            Wheelchair Mobility     Tilt Bed    Modified Rankin (Stroke Patients Only)       Balance Overall balance assessment: Needs assistance Sitting-balance support: Feet supported Sitting balance-Leahy Scale: Good     Standing balance support: Bilateral upper extremity supported, During functional activity Standing balance-Leahy Scale: Poor Standing balance comment: use of RW                             Pertinent Vitals/Pain Pain Assessment Pain Assessment: 0-10 Pain Score: 9  Pain Location: back, hips, and between shoulder blades Pain Descriptors / Indicators: Sharp, Aching (little  N/T in L LE) Pain Intervention(s): Limited activity within patient's tolerance, Monitored during session, Repositioned, Premedicated before session    Home Living Family/patient expects to be discharged to:: Private residence Living Arrangements: Spouse/significant other;Children (15 Y.Alvie [place]) Available Help at Discharge: Family;Friend(s) Type of Home: House Home Access: Stairs to enter Entrance Stairs-Rails: None Entrance Stairs-Number  of Steps: 2   Home Layout: Two level;Able to live on main level with bedroom/bathroom Home Equipment: Rolling Walker (2 wheels);BSC/3in1;Shower seat Additional Comments: wife works 4 minutes from home    Prior Function Prior Level of Function : Independent/Modified Independent;Working/employed             Mobility Comments: no falls. RW at all times. Assist for bed mobility. ADLs Comments: working on getting transportation set up for doctors appoinments (wife gone during the day).     Extremity/Trunk Assessment   Upper Extremity Assessment Upper Extremity Assessment: Defer to OT evaluation    Lower Extremity Assessment Lower Extremity Assessment: Generalized weakness RLE Deficits / Details: unable to formally assess due to pain RLE Sensation: history of peripheral neuropathy LLE Deficits / Details: unable to formally assess due to pain LLE Sensation: history of peripheral neuropathy    Cervical / Trunk Assessment Cervical / Trunk Assessment: Other exceptions Cervical / Trunk Exceptions: scoliosis  Communication   Communication Communication: No apparent difficulties    Cognition Arousal: Alert Behavior During Therapy: WFL for tasks assessed/performed   PT - Cognitive impairments: No apparent impairments                         Following commands: Intact       Cueing       General Comments      Exercises     Assessment/Plan    PT Assessment Patient needs continued PT services  PT Problem List Decreased strength;Decreased activity tolerance;Decreased balance;Pain;Decreased mobility;Impaired sensation       PT Treatment Interventions DME instruction;Gait training;Stair training;Patient/family education;Functional mobility training;Therapeutic activities;Therapeutic exercise;Balance training    PT Goals (Current goals can be found in the Care Plan section)  Acute Rehab PT Goals Patient Stated Goal: have less pain and be able to move better PT  Goal Formulation: With patient/family Time For Goal Achievement: 06/18/23 Potential to Achieve Goals: Fair    Frequency Min 3X/week     Co-evaluation               AM-PAC PT "6 Clicks" Mobility  Outcome Measure Help needed turning from your back to your side while in a flat bed without using bedrails?: A Little Help needed moving from lying on your back to sitting on the side of a flat bed without using bedrails?: A Lot Help needed moving to and from a bed to a chair (including a wheelchair)?: A Little Help needed standing up from a chair using your arms (e.g., wheelchair or bedside chair)?: A Little Help needed to walk in hospital room?: A Little Help needed climbing 3-5 steps with a railing? : A Lot 6 Click Score: 16    End of Session   Activity Tolerance: Patient limited by pain Patient left: in chair;with call bell/phone within reach;with chair alarm set;with family/visitor present Nurse Communication: Mobility status PT Visit Diagnosis: Muscle weakness (generalized) (M62.81);Difficulty in walking, not elsewhere classified (R26.2);Pain Pain - Right/Left:  (bilaterally) Pain - part of body:  (back, hips, between shoulder blades)    Time: 7253-6644 PT Time Calculation (min) (ACUTE ONLY): 14 min  Charges:   PT Evaluation $PT Eval Low Complexity: 1 Low   PT General Charges $$ ACUTE PT VISIT: 1 Visit         Lyman Speller PT, DPT  Acute Rehabilitation Services  Office [phone]  06/04/2023, 1:51 PM

## 2023-06-04 NOTE — Hospital Course (Addendum)
 Brief Narrative:  58 year old with history of DM2, metastatic colon cancer, chronic anemia, thrombocytopenia, HTN, HLD, DVT on chronic Pradaxa, asthma admitted from oncology office for concerns of abscess involving gluteal fold.  Upon admission started on IV antibiotics.   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  - Purulent drainage of this area.  Previously has had pilonidal cyst requiring surgical drainage.  Continue broad-spectrum IV antibiotics. Will obtain CT A/P for eval for deeper infection. Briefly discussed with Gen Surgery. Depending on the findings we can decide on I&D  Generalized weakness - PT/OT  Diabetes mellitus type 2 - Continue long-acting insulin, sliding scale and Accu-Chek.  Adjust as necessary - A1c 05/20/2018 5-9.5  Hypomagnesemia - Repletion  History of metastatic colon cancer - Patient has been having significant diffuse pain due to metastatic disease.  Discussed with Dr [name] today who plans on giving outpatient chemotherapy once recovered from this acute infection.  Anemia of chronic disease Thrombocytopenia - Hemoglobin around baseline of 10.  Today 7.8, likely delusional  Essential hypertension - Toprol-XL.  IV as needed  Hyperlipidemia - Statin  Prior history of DVT - On Pradaxa  Asthma - Appears to be stable.  Continue home bronchodilators   DVT prophylaxis: Pradaxa    Code Status: Full Code Family Communication:   Status is: Inpatient Remains inpatient appropriate because: Pending further eval based on CT    Subjective: Drainage from the gluteal area has  subsided but family tells me its intermittent.    Examination:  General exam: Appears calm and comfortable  Respiratory system: Clear to auscultation. Respiratory effort normal. Cardiovascular system: S1 & S2 heard, RRR. No JVD, murmurs, rubs, gallops or clicks. No pedal edema. Gastrointestinal system: Abdomen is nondistended, soft and nontender. No organomegaly or masses felt. Normal bowel sounds heard. Central nervous system: Alert and oriented. No focal neurological deficits. Extremities: Symmetric 5 x 5 power. Skin: drainage site in the pics noted below  Psychiatry: Judgement and insight appear normal. Mood & affect appropriate.

## 2023-06-04 NOTE — Progress Notes (Signed)
 Patient given night dose of Pradaxa. Afterwards, wife states it was supposed to be held but patient states it was resumed today. Previous documentation by RN states held per provider. Reviewed notes from today and there is not mention of holding Pradaxa. Charge RN made aware. Will advise oncoming day shift RN to verify with provider if Pradaxa should be held and place it on hold on the [facility].

## 2023-06-04 NOTE — Plan of Care (Signed)

## 2023-06-04 NOTE — Progress Notes (Signed)
 PROGRESS NOTE    [name]  ZOX:[id number] DOB: Mar 07, 1966 DOA: 06/03/2023 PCP: Joycelyn Rua, MD    Brief Narrative:  58 year old with history of DM2, metastatic colon cancer, chronic anemia, thrombocytopenia, HTN, HLD, DVT on chronic Pradaxa, asthma admitted from oncology office for concerns of abscess involving gluteal fold.  Upon admission started on IV antibiotics.   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  - Purulent drainage of this area.  Previously has had pilonidal cyst requiring surgical drainage.  Continue broad-spectrum IV antibiotics. Will obtain CT A/P for eval for deeper infection. Briefly discussed with Gen Surgery. Depending on the findings we can decide on I&D  Generalized weakness - PT/OT  Diabetes mellitus type 2 - Continue long-acting insulin, sliding scale and Accu-Chek.  Adjust as necessary - A1c 05/20/2018 5-9.5  Hypomagnesemia - Repletion  History of metastatic colon cancer - Patient has been having significant diffuse pain due to metastatic disease.  Discussed with Dr [name] today who plans on giving outpatient chemotherapy once recovered from this acute infection.  Anemia of chronic disease Thrombocytopenia - Hemoglobin around baseline of 10.  Today 7.8, likely delusional  Essential hypertension - Toprol-XL.  IV as needed  Hyperlipidemia - Statin  Prior history of DVT - On Pradaxa  Asthma - Appears to be stable.  Continue home bronchodilators   DVT prophylaxis: Pradaxa    Code Status: Full Code Family Communication:   Status is: Inpatient Remains  inpatient appropriate because: Pending further eval based on CT    Subjective: Drainage from the gluteal area has subsided but family tells me its intermittent.    Examination:  General exam: Appears calm and comfortable  Respiratory system: Clear to auscultation. Respiratory effort normal. Cardiovascular system: S1 & S2 heard, RRR. No JVD, murmurs, rubs, gallops or clicks. No pedal edema. Gastrointestinal system: Abdomen is nondistended, soft and nontender. No organomegaly or masses felt. Normal bowel sounds heard. Central nervous system: Alert and oriented. No focal neurological deficits. Extremities: Symmetric 5 x 5 power. Skin: drainage site in the pics noted below  Psychiatry: Judgement and insight appear normal. Mood & affect appropriate.                    Diet Orders (From admission, onward)     Start     Ordered   06/03/23 2200  Diet regular Room service appropriate? Yes; Fluid consistency: Thin  Diet effective now       Question Answer Comment  Room service appropriate? Yes   Fluid consistency: Thin      06/03/23 2200            Objective: Vitals:   06/03/23 2143 06/04/23 0556 06/04/23 0915 06/04/23 0939  BP: 113/87 138/88 (!) 134/91   Pulse: (!) 102 (!) 105    Resp: 16 18 18    Temp: 98.5 F (36.9 C) 99.3 F (37.4 C) 98.9 F (37.2 C)   TempSrc: Oral Oral Oral   SpO2: 98% 97% 97% 95%    Intake/Output Summary (Last 24 hours) at 06/04/2023 1111 Last data filed at 06/04/2023 0900 Gross per  24 hour  Intake 480 ml  Output 300 ml  Net 180 ml   There were no vitals filed for this visit.  Scheduled Meds:  atorvastatin  10 mg Oral Daily   budesonide  0.5 mg Nebulization BID   dabigatran  150 mg Oral BID   insulin aspart  0-9 Units Subcutaneous TID WC   insulin glargine  20 Units Subcutaneous q morning   metoprolol succinate  25 mg Oral Daily   morphine  30 mg Oral Q12H   senna-docusate  1 tablet Oral QHS   Continuous Infusions:   cefTRIAXone (ROCEPHIN)  IV 1 g (06/04/23 0130)   lactated ringers 125 mL/hr at 06/03/23 2321   magnesium sulfate bolus IVPB 4 g (06/04/23 1006)   metronidazole 500 mg (06/04/23 0205)   vancomycin      Nutritional status     There is no height or weight on file to calculate BMI.  Data Reviewed:   CBC: Recent Labs  Lab 06/03/23 0847 06/04/23 0500  WBC 10.0 7.0  NEUTROABS 8.1* 5.2  HGB 9.4* 7.8*  HCT 28.4* 25.3*  MCV 98.6 103.7*  PLT 123* 105*   Basic Metabolic Panel: Recent Labs  Lab 06/03/23 0847 06/04/23 0500  NA 129* 128*  K 4.5 3.9  CL 94* 96*  CO2 24 21*  GLUCOSE 204* 107*  BUN 26* 20  CREATININE 0.71 0.57*  CALCIUM 8.4* 7.6*  MG  --  1.5*   GFR: Estimated Creatinine Clearance: 111.1 mL/min (A) (by C-G formula based on SCr of 0.57 mg/dL (L)). Liver Function Tests: Recent Labs  Lab 06/03/23 0847 06/04/23 0500  AST 22 21  ALT 9 11  ALKPHOS 933* 626*  BILITOT 0.6 0.7  PROT 6.5 5.5*  ALBUMIN 3.3* 2.1*   No results for input(s): "LIPASE", "AMYLASE" in the last 168 hours. No results for input(s): "AMMONIA" in the last 168 hours. Coagulation Profile: No results for input(s): "INR", "PROTIME" in the last 168 hours. Cardiac Enzymes: No results for input(s): "CKTOTAL", "CKMB", "CKMBINDEX", "TROPONINI" in the last 168 hours. BNP (last 3 results) No results for input(s): "PROBNP" in the last 8760 hours. HbA1C: No results for input(s): "HGBA1C" in the last 72 hours. CBG: Recent Labs  Lab 06/04/23 0747 06/04/23 0950  GLUCAP 92 128*   Lipid Profile: No results for input(s): "CHOL", "HDL", "LDLCALC", "TRIG", "CHOLHDL", "LDLDIRECT" in the last 72 hours. Thyroid Function Tests: No results for input(s): "TSH", "T4TOTAL", "FREET4", "T3FREE", "THYROIDAB" in the last 72 hours. Anemia Panel: Recent Labs    06/03/23 0847  FERRITIN 2,855*   Sepsis Labs: No results for input(s): "PROCALCITON", "LATICACIDVEN" in the last 168 hours.  No results found for  this or any previous visit (from the past 240 hours).       Radiology Studies: No results found.         LOS: 1 day   Time spent= 35 mins    Miguel Rota, MD [facility]  If 7PM-7AM, please contact night-coverage  06/04/2023, 11:11 AM

## 2023-06-04 NOTE — Progress Notes (Signed)
 IP PROGRESS NOTE  Subjective:   Mr. Raether continues to go week.  He reports persistent leg weakness and lower back/pelvic pain.  The pain is partially relieved with the current narcotic regimen.  MS Contin has helped.  Objective: Vital signs in last 24 hours: Blood pressure (!) 134/91, pulse (!) 105, temperature 98.9 F (37.2 C), temperature source Oral, resp. rate 18, SpO2 95%.  Intake/Output from previous day: No intake/output data recorded.  Physical Exam:  HEENT: No thrush Lungs: Clear bilaterally Cardiac: Regular rate and rhythm Abdomen: Nontender, no hepatosplenomegaly Extremities: No leg edema Neurologic: Alert and oriented, decreased strength with flexion at the hip on the left greater than right Skin: Indurated cyst at the suprapubic area without drainage, erythema and tenderness at the upper gluteal fold-tenderness over the area of induration.  I cannot express drainage today.  Portacath/PICC-without erythema  Lab Results: Recent Labs    06/03/23 0847 06/04/23 0500  WBC 10.0 7.0  HGB 9.4* 7.8*  HCT 28.4* 25.3*  PLT 123* 105*    BMET Recent Labs    06/03/23 0847 06/04/23 0500  NA 129* 128*  K 4.5 3.9  CL 94* 96*  CO2 24 21*  GLUCOSE 204* 107*  BUN 26* 20  CREATININE 0.71 0.57*  CALCIUM 8.4* 7.6*    Lab Results  Component Value Date   CEA1 2.00 07/13/2020   CEA 35.31 (H) 06/03/2023     Medications: I have reviewed the patient's current medications.  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, status post a left colectomy 03/04/2019 Mass felt to be in the transverse colon on a colonoscopy 01/07/2019 with a biopsy confirming poorly differentiated adenocarcinoma with signet ring cell features, mismatch repair protein expression normal CT abdomen/pelvis 01/08/2019 12.3 cm indeterminate splenic mass, present in 2015 on a lumbar MRI-felt to  be benign, small subpleural and pleural-based nodules at the lung bases, status post left nephrectomy CT chest 01/20/2019-small bilateral pulmonary nodules with rounded parenchymal nodules in the right lower lobe, indeterminate splenic mass CT chest 03/18/2019-multiple subcentimeter pulmonary nodules again seen bilaterally, greatest in the lower lobes, without significant change.  No new or enlarging pulmonary nodules or masses.  Partially visualized large heterogeneous splenic mass with no significant change.  Plan for follow-up chest CT at a 83-month interval. Cycle 1 FOLFOX 03/21/2019 Cycle 2 FOLFOX 04/04/2019 Cycle 3 FOLFOX 04/18/2019 (oxaliplatin dose reduced secondary to thrombocytopenia) Cycle 4 FOLFOX 05/02/2019 (oxaliplatin held secondary to thrombocytopenia) Cycle 5 FOLFOX 05/16/2019 Cycle 6 FOLFOX 05/30/2019 (oxaliplatin held secondary to thrombocytopenia) CT chest 06/08/2019-stable scattered small solid pulmonary nodules  Cycle 7 FOLFOX 06/13/2019 Cycle 8 FOLFOX 06/27/2019 Cycle 9 FOLFOX 07/11/2019 Cycle 10 FOLFOX 07/25/2019 (oxaliplatin held due to thrombocytopenia) Cycle 11 FOLFOX 08/08/2019 Cycle 12 FOLFOX 08/22/2019 CTs 01/09/2020-no evidence of recurrent disease, stable lung nodules favored to represent subpleural lymph nodes-dedicated follow-up not recommended CTs 01/11/2021-no evidence of recurrent disease CTs 01/24/2022-no evidence of recurrent disease CT lumbar spine, abdomen/pelvis 04/11/2022-numerous mixed lytic and sclerotic lesions scattered throughout the visualized axial and appendicular skeleton including several areas in the lumbar spine.   04/21/2022 CT biopsy sclerotic lesion right anterior iliac bone-consistent with metastatic colorectal adenocarcinoma, moderate to poorly differentiated.  Foundation 1-microsatellite stable, tumor mutation burden 4, K-ras G12S PET scan 05/09/2022-widespread hypermetabolic mixed faintly lytic and sclerotic osseous metastases throughout the axial and  proximal appendicular skeleton; associated mild pathologic compression fracture at L3; no hypermetabolic extraosseous metastatic disease. Cycle 1 FOLFIRI/bevacizumab 05/26/2022  Cycle 2 FOLFIRI/bevacizumab 06/09/2022 Cycle 3 FOLFIRI 06/24/2022, bevacizumab 06/26/2022 Cycle 4 FOLFIRI 07/08/2022, bevacizumab held due to proteinuria Cycle 5 FOLFIRI 07/22/2022, bevacizumab held due to proteinuria CTs 07/31/2022-potentially areas of sclerosis represent interval healing; new lytic lesion at T12, multiple new areas of punched-out sclerosis throughout the visualized osseous structures.  Index lesion in the left iliac wing measures 1.3 x 2.8 cm.  Healing of previously metabolically occult metastatic disease on 05/09/2022 is a possibility. Cycle 6 FOLFIRI 08/04/2022, irinotecan dose reduced due to thrombocytopenia, bevacizumab held pending 24-hour urine result Cycle 7 FOLFIRI 08/18/2022, bevacizumab held secondary to proteinuria, G-CSF held Cycle 8 FOLFIRI 09/01/2022, bevacizumab held, G-CSF Cycle 9 FOLFIRI 09/16/2022, bevacizumab held, G-CSF CTs 09/29/2022-enlargement of a hypodense lesion posterior left lobe of the liver, similar diffuse mixed lytic and sclerotic osseous metastatic disease, multiple new small nodules medial right lung base, multiple other small bilateral pulmonary nodules unchanged. Cycle 10 FOLFIRI plus Avastin 10/08/2022 Cycle 11 FOLFIRI 10/27/2022, Avastin held Cycle 12 FOLFIRI/Avastin 11/11/2022 Cycle 13 FOLFIRI/Avastin 11/25/2022 CTs 12/08/2022-stable lesion posterior left lobe of the liver.  Unchanged diffuse lytic and sclerotic osseous metastatic disease.  Stable small solid pulmonary nodules.  Stable splenic lesions.  Slightly increased bladder wall thickening. Cycle 14 FOLFIRI/Avastin 12/10/2022 Cycle 15 FOLFIRI/Avastin 12/24/2022 12/31/2022 24-hour urine protein 710 mg Cycle 16 FOLFIRI/Avastin 01/07/2023 Cycle 17 FOLFIRI 01/21/2023, Avastin held secondary to persistent proteinuria, slight increase in  creatinine, and drainage from the suprapubic lesion CTs 01/26/2023-unchanged widespread sclerotic osseous metastatic disease, unchanged hypodense liver lesions, occasional small bilateral lung nodules unchanged. Cycle 18 FOLFIRI 02/03/2023, Avastin held Cycle 19 FOLFIRI 02/17/2023, Avastin held Cycle 20 FOLFIRI 03/03/2023, Avastin held Cycle 21 FOLFIRI 03/16/2023, Avastin held CTs 04/04/2023: Right increased sclerosis of osseous metastatic disease with new sclerotic lesions of the sternum/manubrium and increase sclerotic metastases throughout the pelvis, unchanged hypodense liver lesions Cycle 22 FOLFIRI 04/07/2023, Avastin held Cycle 23 FOLFIRI/Avastin 04/22/2023 Cycle 24 FOLFIRI/Avastin 05/06/2023   Multiple colon polyps-ascending colon polyps on the colonoscopy 01/07/2019-not removed Colonoscopy 01/03/2020-multiple polyps removed, tubular adenomas, inflammatory and hyperplastic polyps Wilms tumor at age 67, 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  Left nephrectomy at age 79 12.  Port-A-Cath placement, Dr. Michaell Cowing, 03/16/2019 13.  Thrombocytopenia secondary to chemotherapy-oxaliplatin dose reduced beginning with cycle 3 FOLFOX, improved 14.  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  Right groin soft tissue infection May 2023-status postsurgical debridement 18.  Left leg DVT 12/13/2021-common femoral, femoral, popliteal, posterior tibial, and peroneal veins 19.  02/04/2022-MRI cervical spine-1.7 cm T1 lesion-indeterminate; bone scan 03/14/2022-focal intense activity at  approximate T1 level felt to correspond to the lesion on MRI, additional foci of activity involving the lower thoracic and lumbar spine, right iliac bone and pubic symphysis.  CT lumbar spine, abdomen/pelvis 04/11/2022-numerous mixed lytic and sclerotic lesions scattered throughout the visualized axial and appendicular skeleton including several areas in the lumbar spine.  04/21/2022 CT biopsy sclerotic lesion right anterior iliac bone-consistent with metastatic colorectal adenocarcinoma, moderate to poorly differentiated 20.  Inflamed sebaceous cyst 08/18/2022-doxycycline Surgical resection of area of "hidradenitis " 03/19/2023 21.  Admission 05/24/2023 with increased back pain and new onset leg weakness MRI thoracic and lumbar  spine 05/24/2023: Fuhs osseous metastatic disease, no acute compression fracture, normal cord signal but no spinal canal stenosis, no abnormal epidural soft tissue MRI cervical spine 05/25/2023: Diffuse osseous metastatic disease, small amount of extraosseous/epidural tumor at the right ventral epidural space at T1 without stenosis, no pathologic compression fracture MRI brain 05/25/2023: 2 punctate foci of enhancement in the left parieto-occipital region-tiny metastases suspected, osseous metastatic disease Lumbar puncture 05/27/2023-cytology negative for malignant cells 22.  Admission 06/03/2023 with clinical evidence of a gluteal abscess and failure to thrive  Mr. Haack has metastatic colon cancer.  He is symptomatic with severe pain secondary to extensive bone metastases.  The plan is to begin salvage chemotherapy with FOLFOX when he is discharged from the hospital.  He was seen at [facility] this week.  He may qualify for a CAR-T clinical trial.  He has persistent leg weakness of unclear etiology.  No evidence of cord or nerve root compression was found during the hospital admission last month.  A CSF cytology 05/27/2023 returned negative for malignancy.   He is now admitted with  induration and drainage at the upper gluteal fold.  I suspect he has an abscess.  He has anemia secondary to chronic disease, extensive bone metastases, chemotherapy, and bleeding.  Recommendations: 1.  Antibiotics per the medical service 2.  Consult surgery to evaluate the apparent gluteal abscess 3.  Continue narcotic analgesics pain, resume home hydromorphone dose 4.  Pradaxa for the left lower extremity DVT 5.  Transfuse packed red blood cells as needed for symptomatic anemia 6.  Please call oncology as needed-outpatient follow-up will be scheduled at the Cancer center for an office visit and to begin FOLFOX chemotherapy    LOS: 1 day   Thornton Papas, MD   06/04/2023, 1:30 PM

## 2023-06-05 DIAGNOSIS — L0231 Cutaneous abscess of buttock: Secondary | ICD-10-CM | POA: Diagnosis not present

## 2023-06-05 LAB — COMPREHENSIVE METABOLIC PANEL
ALT: 12 U/L (ref 0–44)
AST: 22 U/L (ref 15–41)
Albumin: 1.8 g/dL — ABNORMAL LOW (ref 3.5–5.0)
Alkaline Phosphatase: 572 U/L — ABNORMAL HIGH (ref 38–126)
Anion gap: 9 (ref 5–15)
BUN: 13 mg/dL (ref 6–20)
CO2: 23 mmol/L (ref 22–32)
Calcium: 7.3 mg/dL — ABNORMAL LOW (ref 8.9–10.3)
Chloride: 97 mmol/L — ABNORMAL LOW (ref 98–111)
Creatinine, Ser: 0.56 mg/dL — ABNORMAL LOW (ref 0.61–1.24)
GFR, Estimated: >60 mL/min (ref >60)
Glucose, Bld: 85 mg/dL (ref 70–99)
Potassium: 4.1 mmol/L (ref 3.5–5.1)
Sodium: 129 mmol/L — ABNORMAL LOW (ref 135–145)
Total Bilirubin: 0.5 mg/dL (ref 0.0–1.2)
Total Protein: 5.3 g/dL — ABNORMAL LOW (ref 6.5–8.1)

## 2023-06-05 LAB — CBC
HCT: 24.3 % — ABNORMAL LOW (ref 39.0–52.0)
Hemoglobin: 7.6 g/dL — ABNORMAL LOW (ref 13.0–17.0)
MCH: 32.1 pg (ref 26.0–34.0)
MCHC: 31.3 g/dL (ref 30.0–36.0)
MCV: 102.5 fL — ABNORMAL HIGH (ref 80.0–100.0)
Platelets: 98 10*3/uL — ABNORMAL LOW (ref 150–400)
RBC: 2.37 MIL/uL — ABNORMAL LOW (ref 4.22–5.81)
RDW: 18.3 % — ABNORMAL HIGH (ref 11.5–15.5)
WBC: 6.9 10*3/uL (ref 4.0–10.5)
nRBC: 0.3 % — ABNORMAL HIGH (ref 0.0–0.2)

## 2023-06-05 LAB — PHOSPHORUS: Phosphorus: 3.4 mg/dL (ref 2.5–4.6)

## 2023-06-05 LAB — GLUCOSE, CAPILLARY
Glucose-Capillary: 128 mg/dL — ABNORMAL HIGH (ref 70–99)
Glucose-Capillary: 148 mg/dL — ABNORMAL HIGH (ref 70–99)
Glucose-Capillary: 187 mg/dL — ABNORMAL HIGH (ref 70–99)
Glucose-Capillary: 235 mg/dL — ABNORMAL HIGH (ref 70–99)
Glucose-Capillary: 71 mg/dL (ref 70–99)

## 2023-06-05 LAB — MAGNESIUM: Magnesium: 2 mg/dL (ref 1.7–2.4)

## 2023-06-05 MED ORDER — CALCIUM CARBONATE ANTACID 500 MG PO CHEW: 800.0000 mg | CHEWABLE_TABLET | Freq: Four times a day (QID) | ORAL | Status: DC | PRN

## 2023-06-05 MED ORDER — FAMOTIDINE 20 MG PO TABS
20.0000 mg | ORAL_TABLET | Freq: Two times a day (BID) | ORAL | Status: DC
Start: 2023-06-05 — End: 2023-06-06
  Administered 2023-06-05 – 2023-06-06 (×2): 20 mg via ORAL
  Filled 2023-06-05 (×2): qty 1

## 2023-06-05 MED ORDER — SODIUM CHLORIDE 0.9% FLUSH: 10.0000 mL | INTRAVENOUS | Status: DC | PRN

## 2023-06-05 MED ORDER — CHLORHEXIDINE GLUCONATE CLOTH 2 % EX PADS: 6.0000 | MEDICATED_PAD | Freq: Every day | CUTANEOUS | Status: DC

## 2023-06-05 MED ORDER — SODIUM CHLORIDE 0.9% FLUSH
10.0000 mL | Freq: Two times a day (BID) | INTRAVENOUS | Status: DC
  Administered 2023-06-06: 10 mL

## 2023-06-05 NOTE — Plan of Care (Signed)

## 2023-06-05 NOTE — Progress Notes (Signed)
 PROGRESS NOTE    [name]  WUJ:[id number] DOB: 07-10-1965 DOA: 06/03/2023 PCP: Joycelyn Rua, MD   Brief Narrative:  This 58 year old with PMH significant for DM2, metastatic colon cancer, chronic anemia, thrombocytopenia, HTN, HLD, DVT on chronic Pradaxa, asthma admitted from oncology office for concerns of abscess involving gluteal fold. Upon admission started on IV antibiotics. General surgery consulted.  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   Previously has had pilonidal cyst requiring surgical drainage.   Continue broad-spectrum IV antibiotics.  CT A/P probable splenic hematoma, status post left nephrectomy, grossly stable diffuse osseous metastatic Dr. Nelson Chimes discussed with Gen Surgery. CT doesn't show any abscess.   Generalized weakness: PT/OT evaluation.   Diabetes mellitus type 2 Continue long-acting insulin, sliding scale and Accu-Chek.   Adjust as necessary  Hypomagnesemia: Replaced. Continue to monitor.   History of metastatic colon cancer: Patient has been having significant diffuse pain due to metastatic disease.   Discussed with Dr [name] who plans on giving outpatient chemotherapy once recovered from this acute infection.   Anemia of chronic disease: Thrombocytopenia: Hemoglobin around baseline of 10.  Today 7.6, likely delusional. Monitor H/H , May require blood transfusion if Hb drops further.   Essential hypertension Continue Toprol-XL.  IV as needed   Hyperlipidemia: Continue Statin.   Prior history of  DVT: Continue  Pradaxa.   Asthma: Appears to be stable.  Continue home bronchodilators   DVT prophylaxis: Pradaxa Code Status: Full code Family Communication: No family at bed side. Disposition Plan:    Status is: Inpatient Remains inpatient appropriate because: Admitted for gluteal Abscess.   Consultants:  General surgery  Procedures:  Antimicrobials:  Anti-infectives (From admission, onward)    Start     Dose/Rate Route Frequency Ordered Stop   06/04/23 1200  Vancomycin (VANCOCIN) 1,500 mg in sodium chloride 0.9 % 500 mL IVPB  Status:  Discontinued        1,500 mg 250 mL/hr over 120 Minutes Intravenous Every 12 hours 06/03/23 2326 06/04/23 0917   06/04/23 1200  vancomycin (VANCOREADY) IVPB 1500 mg/300 mL        1,500 mg 150 mL/hr over 120 Minutes Intravenous Every 12 hours 06/04/23 0917     06/03/23 2315  vancomycin (VANCOREADY) IVPB 1750 mg/350 mL        1,750 mg 175 mL/hr over 120 Minutes Intravenous  Once 06/03/23 2218 06/04/23 0122   06/03/23 2300  cefTRIAXone (ROCEPHIN) 1 g in sodium chloride 0.9 % 100 mL IVPB        1 g 200 mL/hr over 30 Minutes Intravenous Every 24 hours 06/03/23 2206     06/03/23 2300  metroNIDAZOLE (FLAGYL) IVPB 500 mg        500 mg 100 mL/hr over 60 Minutes Intravenous Every 12 hours 06/03/23 2206        Subjective: Patient was seen and examined at bedside.  Overnight events noted. Patient has a CT abdomen and pelvis which did not show any gluteal abscess. Patient is starting chemotherapy next week.  Objective: Vitals:   06/04/23  2009 06/05/23 0918 06/05/23 0957 06/05/23 1317  BP: 129/81  (!) 145/85 139/83  Pulse: 92  93 92  Resp: 18   20  Temp: 99.4 F (37.4 C)   98.6 F (37 C)  TempSrc: Oral   Oral  SpO2: 94% 99%  95%    Intake/Output Summary (Last 24 hours) at 06/05/2023 1420 Last data filed at 06/05/2023 0552 Gross per 24 hour  Intake 1727.8 ml  Output 1400 ml  Net 327.8 ml   There were no vitals filed for this  visit.  Examination:  General exam: Appears calm and comfortable, Not in any acute distress.  Respiratory system: CTA Bilaterally. Respiratory effort normal. RR 15 Cardiovascular system: S1 & S2 heard, RRR. No JVD, murmurs, rubs, gallops or clicks. No pedal edema. Gastrointestinal system: Abdomen is non distended, soft and non tender.  Normal bowel sounds heard. Central nervous system: Alert and oriented x 3. No focal neurological deficits. Extremities: No edema, No cyanosis, No clubbing Skin: No rashes, lesions or ulcers Psychiatry: Judgement and insight appear normal. Mood & affect appropriate.     Data Reviewed: I have personally reviewed following labs and imaging studies  CBC: Recent Labs  Lab 06/03/23 0847 06/04/23 0500 06/05/23 0519  WBC 10.0 7.0 6.9  NEUTROABS 8.1* 5.2  --   HGB 9.4* 7.8* 7.6*  HCT 28.4* 25.3* 24.3*  MCV 98.6 103.7* 102.5*  PLT 123* 105* 98*   Basic Metabolic Panel: Recent Labs  Lab 06/03/23 0847 06/04/23 0500 06/05/23 0519  NA 129* 128* 129*  K 4.5 3.9 4.1  CL 94* 96* 97*  CO2 24 21* 23  GLUCOSE 204* 107* 85  BUN 26* 20 13  CREATININE 0.71 0.57* 0.56*  CALCIUM 8.4* 7.6* 7.3*  MG  --  1.5* 2.0  PHOS  --   --  3.4   GFR: Estimated Creatinine Clearance: 111.1 mL/min (A) (by C-G formula based on SCr of 0.56 mg/dL (L)). Liver Function Tests: Recent Labs  Lab 06/03/23 0847 06/04/23 0500 06/05/23 0519  AST 22 21 22   ALT 9 11 12   ALKPHOS 933* 626* 572*  BILITOT 0.6 0.7 0.5  PROT 6.5 5.5* 5.3*  ALBUMIN 3.3* 2.1* 1.8*   No results for input(s): "LIPASE", "AMYLASE" in the last 168 hours. No results for input(s): "AMMONIA" in the last 168 hours. Coagulation Profile: No results for input(s): "INR", "PROTIME" in the last 168 hours. Cardiac Enzymes: No results for input(s): "CKTOTAL", "CKMB", "CKMBINDEX", "TROPONINI" in the last 168 hours. BNP (last 3 results) No results for input(s): "PROBNP" in the last 8760 hours. HbA1C: No results  for input(s): "HGBA1C" in the last 72 hours. CBG: Recent Labs  Lab 06/04/23 1554 06/04/23 2006 06/05/23 0011 06/05/23 0752 06/05/23 1143  GLUCAP 222* 224* 128* 71 187*   Lipid Profile: No results for input(s): "CHOL", "HDL", "LDLCALC", "TRIG", "CHOLHDL", "LDLDIRECT" in the last 72 hours. Thyroid Function Tests: No results for input(s): "TSH", "T4TOTAL", "FREET4", "T3FREE", "THYROIDAB" in the last 72 hours. Anemia Panel: Recent Labs    06/03/23 0847  FERRITIN 2,855*   Sepsis Labs: No results for input(s): "PROCALCITON", "LATICACIDVEN" in the last 168 hours.  No results found for this or any previous visit (from the past 240 hours).  Radiology Studies: CT ABDOMEN PELVIS W CONTRAST Result Date: 06/04/2023 CLINICAL DATA:  Acute generalized abdominal pain. History of metastatic colon cancer. EXAM: CT ABDOMEN AND PELVIS WITH CONTRAST TECHNIQUE: Multidetector CT imaging of the abdomen and pelvis was performed using the standard protocol following  bolus administration of intravenous contrast. RADIATION DOSE REDUCTION: This exam was performed according to the departmental dose-optimization program which includes automated exposure control, adjustment of the mA and/or kV according to patient size and/or use of iterative reconstruction technique. CONTRAST:  OMNIPAQUE IOHEXOL 300 MG/ML  SOLN COMPARISON:  April 04, 2023.  May 09, 2022. FINDINGS: Lower chest: No acute abnormality. Hepatobiliary: Small gallstone. No biliary dilatation. Liver is unremarkable. Pancreas: Unremarkable. No pancreatic ductal dilatation or surrounding inflammatory changes. Spleen: 11 x 9.1 cm complex lesion seen in the spleen as described on prior exam which may represent hamartoma as noted on prior studies. Adrenals/Urinary Tract: Adrenal glands are unremarkable. Status post left nephrectomy. No hydronephrosis or renal obstruction. Urinary bladder is unremarkable. Stomach/Bowel: Stomach is unremarkable. There is no  evidence of bowel obstruction or inflammation. Appendix appears normal. Vascular/Lymphatic: Aortic atherosclerosis. No enlarged abdominal or pelvic lymph nodes. Reproductive: Prostate is unremarkable. Other: No ascites or hernia is noted. Musculoskeletal: Grossly stable sclerotic densities noted throughout skeleton consistent with osseous metastases. IMPRESSION: Small gallstone. 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 Grossly stable diffuse osseous sclerotic densities consistent with osseous metastases. Aortic Atherosclerosis (ICD10-I70.0). Electronically Signed   By: Lupita Raider M.D.   On: 06/04/2023 19:14    Scheduled Meds:  atorvastatin  10 mg Oral Daily   budesonide  0.5 mg Nebulization BID   dabigatran  150 mg Oral BID   feeding supplement  237 mL Oral BID BM   insulin aspart  0-9 Units Subcutaneous TID WC   insulin glargine  20 Units Subcutaneous q morning   metoprolol succinate  25 mg Oral Daily   morphine  30 mg Oral Q12H   multivitamin with minerals  1 tablet Oral Daily   senna-docusate  1 tablet Oral QHS   Continuous Infusions:  cefTRIAXone (ROCEPHIN)  IV 1 g (06/05/23 0327)   lactated ringers 125 mL/hr at 06/05/23 0956   metronidazole 500 mg (06/05/23 1103)   vancomycin 1,500 mg (06/05/23 1306)     LOS: 2 days    Time spent: 50 mins.    [name], MD [facility]   If 7PM-7AM, please contact night-coverage

## 2023-06-05 NOTE — Evaluation (Signed)
 Occupational Therapy Evaluation Patient Details Name: [name] MRN: [id number] DOB: 04/13/1965 Today's Date: 06/05/2023   History of Present Illness   Pt is a 58 y.o. male presenting as a direct admission from oncology clinic with abscess involving the upper gluteal fold after presenting from home to oncology clinic. PMH significant for type 2 diabetes mellitus, metastatic colon cancer, chronic anemia associated baseline hemoglobin 9-12, chronic thrombocytopenia, hypertension, hyperlipidemia, history of DVT and chronic hyponatremia.     Clinical Impressions Patient admitted for the diagnosis above.  PTA he lives at home with his spouse and 16 yo son.  Spouse is not home during the day, son is at school, so he has to be Mod I with ADL, toileting and in home mobility.  Currently he is needing up to Mod A for lower body ADL, up to Min A for basic in room mobility/toileting.  Deficits include poor activity tolerance, weakness and poor dynamic balance.  Patient is unsteady to his L leg.  OT will continue to follow to address deficits, and Patient will benefit from continued inpatient follow up therapy, <3 hours/day.  Patient is hoping to increase his status here, but is open to ST Rehab if needed.        If plan is discharge home, recommend the following:   Assist for transportation;Assistance with cooking/housework;A lot of help with bathing/dressing/bathroom;A little help with walking and/or transfers     Functional Status Assessment   Patient has had a recent decline in their functional status and demonstrates the ability to make significant improvements in function in a reasonable and predictable amount of time.     Equipment Recommendations   None recommended by OT     Recommendations for Other Services         Precautions/Restrictions   Precautions Precautions: Fall Recall of Precautions/Restrictions: Intact Restrictions Weight Bearing Restrictions Per Provider  Order: No     Mobility Bed Mobility   Bed Mobility: Supine to Sit     Supine to sit: Min assist, HOB elevated          Transfers Overall transfer level: Needs assistance   Transfers: Sit to/from Stand, Bed to chair/wheelchair/BSC Sit to Stand: Min assist     Step pivot transfers: Min assist            Balance Overall balance assessment: Needs assistance Sitting-balance support: Feet supported Sitting balance-Leahy Scale: Good     Standing balance support: Reliant on assistive device for balance Standing balance-Leahy Scale: Poor                             ADL either performed or assessed with clinical judgement   ADL Overall ADL's : Needs assistance/impaired Eating/Feeding: Set up;Sitting   Grooming: Wash/dry hands;Wash/dry face;Contact guard assist;Standing   Upper Body Bathing: Set up;Sitting   Lower Body Bathing: Minimal assistance;Sit to/from stand Lower Body Bathing Details (indicate cue type and reason): difficulty lifting LLE Upper Body Dressing : Set up;Sitting   Lower Body Dressing: Moderate assistance;Sit to/from stand   Toilet Transfer: Minimal assistance;Regular Toilet;Rolling walker (2 wheels);Ambulation                   Vision Patient Visual Report: No change from baseline       Perception Perception: Not tested       Praxis Praxis: Not tested       Pertinent Vitals/Pain Pain Assessment Pain Assessment: Faces Faces Pain Scale:  Hurts even more Pain Location: bottom, L leg Pain Descriptors / Indicators: Guarding, Grimacing, Sore Pain Intervention(s): Monitored during session, Premedicated before session     Extremity/Trunk Assessment Upper Extremity Assessment Upper Extremity Assessment: Generalized weakness   Lower Extremity Assessment Lower Extremity Assessment: Defer to PT evaluation       Communication Communication Communication: No apparent difficulties   Cognition Arousal: Alert Behavior  During Therapy: WFL for tasks assessed/performed                                 Following commands: Intact       Cueing  General Comments   Cueing Techniques: Verbal cues   VSS on RA   Exercises     Shoulder Instructions      Home Living Family/patient expects to be discharged to:: Private residence Living Arrangements: Spouse/significant other;Children Available Help at Discharge: Family;Friend(s) Type of Home: House Home Access: Stairs to enter [employer] of Steps: 2 Entrance Stairs-Rails: None Home Layout: Two level;Able to live on main level with bedroom/bathroom     Bathroom Shower/Tub: Producer, television/film/video: Standard Bathroom Accessibility: Yes How Accessible: Accessible via walker Home Equipment: Rolling Walker (2 wheels);BSC/3in1;Shower seat          Prior Functioning/Environment Prior Level of Function : Independent/Modified Independent;Working/employed             Mobility Comments: no falls. RW at all times. Assist for bed mobility. ADLs Comments: working on getting transportation set up for doctors appoinments (wife gone during the day).  Ind with ADL, Mod I with meals.    OT Problem List: Decreased strength;Decreased activity tolerance;Impaired balance (sitting and/or standing);Pain   OT Treatment/Interventions: Self-care/ADL training;Therapeutic activities;Therapeutic exercise;Balance training;DME and/or AE instruction;Patient/family education;Energy conservation      OT Goals(Current goals can be found in the care plan section)   Acute Rehab OT Goals Patient Stated Goal: Get stronger and be safer OT Goal Formulation: With patient Time For Goal Achievement: 06/19/23 Potential to Achieve Goals: Good ADL Goals Pt Will Perform Grooming: with modified independence;standing Pt Will Perform Lower Body Bathing: with modified independence;sit to/from stand Pt Will Perform Lower Body Dressing: with modified  independence;sit to/from stand Pt Will Transfer to Toilet: with modified independence;ambulating;regular height toilet Pt/caregiver will Perform Home Exercise Program: Increased strength;Both right and left upper extremity;With theraband;With written HEP provided   OT Frequency:  Min 1X/week    Co-evaluation              AM-PAC OT "6 Clicks" Daily Activity     Outcome Measure Help from another person eating meals?: None Help from another person taking care of personal grooming?: A Little Help from another person toileting, which includes using toliet, bedpan, or urinal?: A Little Help from another person bathing (including washing, rinsing, drying)?: A Lot Help from another person to put on and taking off regular upper body clothing?: A Little Help from another person to put on and taking off regular lower body clothing?: A Lot 6 Click Score: 17   End of Session Equipment Utilized During Treatment: Gait belt;Rolling walker (2 wheels) Nurse Communication: Mobility status  Activity Tolerance: Patient tolerated treatment well Patient left: in chair;with call bell/phone within reach  OT Visit Diagnosis: Unsteadiness on feet (R26.81);Muscle weakness (generalized) (M62.81)                Time: 1010-1035 OT Time Calculation (min): 25 min Charges:  OT General Charges $OT Visit: 1 Visit OT Evaluation $OT Eval Moderate Complexity: 1 Mod OT Treatments $Self Care/Home Management : 8-22 mins  06/05/2023  RP, OTR/L  Acute Rehabilitation Services  Office:  [phone]   Suzanna Obey 06/05/2023, 10:43 AM

## 2023-06-05 NOTE — Progress Notes (Signed)
 Initial Nutrition Assessment  INTERVENTION:   -Ensure Plus High Protein po BID, each supplement provides 350 kcal and 20 grams of protein.   -Multivitamin with minerals daily while admitted  NUTRITION DIAGNOSIS:   Increased nutrient needs related to cancer and cancer related treatments as evidenced by estimated needs.  GOAL:   Patient will meet greater than or equal to 90% of their needs  MONITOR:   PO intake, Supplement acceptance  REASON FOR ASSESSMENT:   Malnutrition Screening Tool, Other (Comment)    ASSESSMENT:   58 year old with history of DM2, metastatic colon cancer, chronic anemia, thrombocytopenia, HTN, HLD, DVT on chronic Pradaxa, asthma admitted from oncology office for concerns of abscess involving gluteal fold.  Patient in room, sitting in chair. Reports he was eating well this morning but had an instance of vomiting after taking a walk. He received some nausea medication. Pt reports he was eating PTA and was trying to drink 2-3 protein shakes daily (Muscle Milk or Atkins). He is willing to drink Ensure here for additional kcals and protein. MVI while admitted. Once resumes treatment to hold off on supplements per MD recommendations.  Pt denies any issues with swallowing or chewing. Reports foods having a metallic taste for the past month.   Per weight records, pt has lost 21 lbs since 03/03/23 (10% wt loss x 3 months, significant for time frame).  Medications: Multivitamin with minerals daily, Senokot, Lactated ringers, Zofran  Labs reviewed: CBGs: 71-224 Low Na   NUTRITION - FOCUSED PHYSICAL EXAM:  Flowsheet Row Most Recent Value  Orbital Region Mild depletion  Upper Arm Region No depletion  Thoracic and Lumbar Region No depletion  Buccal Region No depletion  Temple Region Mild depletion  Clavicle Bone Region No depletion  Clavicle and Acromion Bone Region No depletion  Scapular Bone Region No depletion  Dorsal Hand No depletion  Patellar Region No  depletion  Anterior Thigh Region No depletion  Posterior Calf Region No depletion  Edema (RD Assessment) None  Hair Reviewed  Eyes Reviewed  Mouth Reviewed  Skin Reviewed  Nails Reviewed       Diet Order:   Diet Order             Diet regular Room service appropriate? Yes; Fluid consistency: Thin  Diet effective now                   EDUCATION NEEDS:   Education needs have been addressed  Skin:  Skin Assessment: Reviewed RN Assessment  Last BM:  3/5  Height:   Ht Readings from Last 1 Encounters:  06/03/23 5\' 11"  (1.803 m)    Weight:   Wt Readings from Last 1 Encounters:  06/03/23 82.8 kg    BMI:  25.4 kg/m^2  Estimated Nutritional Needs:   Kcal:  2200-2400  Protein:  110-125g  Fluid:  2L/day   Tilda Franco, MS, RD, LDN Inpatient Clinical Dietitian Contact via Secure chat

## 2023-06-06 DIAGNOSIS — L0231 Cutaneous abscess of buttock: Secondary | ICD-10-CM | POA: Diagnosis not present

## 2023-06-06 LAB — CBC
HCT: 23.3 % — ABNORMAL LOW (ref 39.0–52.0)
Hemoglobin: 7.5 g/dL — ABNORMAL LOW (ref 13.0–17.0)
MCH: 32.5 pg (ref 26.0–34.0)
MCHC: 32.2 g/dL (ref 30.0–36.0)
MCV: 100.9 fL — ABNORMAL HIGH (ref 80.0–100.0)
Platelets: 101 10*3/uL — ABNORMAL LOW (ref 150–400)
RBC: 2.31 MIL/uL — ABNORMAL LOW (ref 4.22–5.81)
RDW: 18.3 % — ABNORMAL HIGH (ref 11.5–15.5)
WBC: 6.2 10*3/uL (ref 4.0–10.5)
nRBC: 0.5 % — ABNORMAL HIGH (ref 0.0–0.2)

## 2023-06-06 LAB — COMPREHENSIVE METABOLIC PANEL
ALT: 14 U/L (ref 0–44)
AST: 24 U/L (ref 15–41)
Albumin: 1.7 g/dL — ABNORMAL LOW (ref 3.5–5.0)
Alkaline Phosphatase: 498 U/L — ABNORMAL HIGH (ref 38–126)
Anion gap: 7 (ref 5–15)
BUN: 13 mg/dL (ref 6–20)
CO2: 24 mmol/L (ref 22–32)
Calcium: 7.4 mg/dL — ABNORMAL LOW (ref 8.9–10.3)
Chloride: 98 mmol/L (ref 98–111)
Creatinine, Ser: 0.59 mg/dL — ABNORMAL LOW (ref 0.61–1.24)
GFR, Estimated: >60 mL/min (ref >60)
Glucose, Bld: 127 mg/dL — ABNORMAL HIGH (ref 70–99)
Potassium: 4.4 mmol/L (ref 3.5–5.1)
Sodium: 129 mmol/L — ABNORMAL LOW (ref 135–145)
Total Bilirubin: 0.5 mg/dL (ref 0.0–1.2)
Total Protein: 5.1 g/dL — ABNORMAL LOW (ref 6.5–8.1)

## 2023-06-06 LAB — GLUCOSE, CAPILLARY
Glucose-Capillary: 103 mg/dL — ABNORMAL HIGH (ref 70–99)
Glucose-Capillary: 124 mg/dL — ABNORMAL HIGH (ref 70–99)

## 2023-06-06 LAB — PHOSPHORUS: Phosphorus: 3.6 mg/dL (ref 2.5–4.6)

## 2023-06-06 LAB — MAGNESIUM: Magnesium: 1.7 mg/dL (ref 1.7–2.4)

## 2023-06-06 MED ORDER — TRAZODONE HCL 50 MG PO TABS: 50.0000 mg | ORAL_TABLET | Freq: Every evening | ORAL | 0 refills | Status: DC | PRN

## 2023-06-06 MED ORDER — HEPARIN SOD (PORK) LOCK FLUSH 100 UNIT/ML IV SOLN
500.0000 [IU] | Freq: Once | INTRAVENOUS | Status: AC
  Administered 2023-06-06: 500 [IU] via INTRAVENOUS
  Filled 2023-06-06: qty 5

## 2023-06-06 NOTE — Discharge Instructions (Signed)
 Advised to follow-up with primary care physician in 1 week. Advised to take Augmentin twice a day for 7 days.   Advised to follow-up with oncologist [name] as scheduled.

## 2023-06-06 NOTE — TOC Transition Note (Signed)
 Transition of Care [facility]) - Discharge Note   Patient Details  Name: [name] MRN: [id number] Date of Birth: 01/13/1966  [facility]) CM/SW Contact:  Beckie Busing, RN Phone Number:[phone]  06/06/2023, 3:02 PM   Clinical Narrative:    Patient with d/c orders. No TOC needs noted.    Final next level of care: Home/Self Care Barriers to Discharge: No Barriers Identified   Patient Goals and CMS Choice     Choice offered to / list presented to : NA      Discharge Placement                       Discharge Plan and Services Additional resources added to the After Visit Summary for                  DME Arranged: N/A DME Agency: NA       HH Arranged: NA HH Agency: NA        Social Drivers of Health (SDOH) Interventions SDOH Screenings   Food Insecurity: No Food Insecurity (06/03/2023)  Housing: Low Risk  (06/03/2023)  Transportation Needs: No Transportation Needs (06/03/2023)  Utilities: Not At Risk (06/03/2023)  Alcohol Screen: Low Risk  (04/28/2023)  Depression (PHQ2-9): Low Risk  (04/28/2023)  Social Connections: Socially Integrated (06/03/2023)  Tobacco Use: Low Risk  (06/04/2023)     Readmission Risk Interventions    05/27/2023   12:04 PM  Readmission Risk Prevention Plan  Transportation Screening Complete  PCP or Specialist Appt within 5-7 Days Complete  Home Care Screening Complete  Medication Review (RN CM) Complete

## 2023-06-06 NOTE — Progress Notes (Signed)
 Pharmacy Antibiotic Note  [name] is a 58 y.o. male admitted on 06/03/2023 with perineal/gluteal infection.  Pharmacy has been consulted for vancomycin dosing.  Plan: Continue Vancomycin 1500mg  IV q12h (SCr rounded 0.8, est AUC 520) Measure Vanc levels as needed.  Goal AUC = 400 - 550  Follow up renal function, culture results, and clinical course.      Temp (24hrs), Avg:98.9 F (37.2 C), Min:98.6 F (37 C), Max:99.1 F (37.3 C)  Recent Labs  Lab 06/03/23 0847 06/04/23 0500 06/05/23 0519 06/06/23 0606  WBC 10.0 7.0 6.9 6.2  CREATININE 0.71 0.57* 0.56* 0.59*    Estimated Creatinine Clearance: 111.1 mL/min (A) (by C-G formula based on SCr of 0.59 mg/dL (L)).    Allergies  Allergen Reactions   Cat Dander Anaphylaxis   Empagliflozin Other (See Comments)    Recurrent UTI    Antimicrobials this admission: 3/5 ceftriaxone >> 3/5 metronidazole >> 3/5 vancomycin >>   Dose adjustments this admission:  Microbiology results: none  Thank you for allowing pharmacy to be a part of this patient's care.  Lynann Beaver PharmD, BCPS [facility] main pharmacy [phone] 06/06/2023 10:42 AM

## 2023-06-06 NOTE — Plan of Care (Signed)
  Problem: Education: Goal: Knowledge of General Education information will improve Description: Including pain rating scale, medication(s)/side effects and non-pharmacologic comfort measures Outcome: Progressing   Problem: Clinical Measurements: Goal: Will remain free from infection Outcome: Progressing Goal: Respiratory complications will improve Outcome: Progressing Goal: Cardiovascular complication will be avoided Outcome: Progressing   Problem: Elimination: Goal: Will not experience complications related to bowel motility Outcome: Progressing Goal: Will not experience complications related to urinary retention Outcome: Progressing   Problem: Safety: Goal: Ability to remain free from injury will improve Outcome: Progressing   Problem: Skin Integrity: Goal: Risk for impaired skin integrity will decrease Outcome: Progressing   Problem: Education: Goal: Ability to describe self-care measures that may prevent or decrease complications (Diabetes Survival Skills Education) will improve Outcome: Progressing   Problem: Fluid Volume: Goal: Ability to maintain a balanced intake and output will improve Outcome: Progressing   Problem: Skin Integrity: Goal: Risk for impaired skin integrity will decrease Outcome: Progressing

## 2023-06-06 NOTE — Plan of Care (Signed)
  Problem: Education: Goal: Knowledge of General Education information will improve Description: Including pain rating scale, medication(s)/side effects and non-pharmacologic comfort measures Outcome: Progressing   Problem: Health Behavior/Discharge Planning: Goal: Ability to manage health-related needs will improve Outcome: Progressing   Problem: Clinical Measurements: Goal: Ability to maintain clinical measurements within normal limits will improve Outcome: Progressing Goal: Will remain free from infection Outcome: Progressing Goal: Respiratory complications will improve Outcome: Progressing Goal: Cardiovascular complication will be avoided Outcome: Progressing   Problem: Activity: Goal: Risk for activity intolerance will decrease Outcome: Progressing   Problem: Nutrition: Goal: Adequate nutrition will be maintained Outcome: Progressing   Problem: Coping: Goal: Level of anxiety will decrease Outcome: Progressing   Problem: Elimination: Goal: Will not experience complications related to bowel motility Outcome: Progressing Goal: Will not experience complications related to urinary retention Outcome: Progressing   Problem: Pain Managment: Goal: General experience of comfort will improve and/or be controlled Outcome: Progressing   Problem: Safety: Goal: Ability to remain free from injury will improve Outcome: Progressing   Problem: Skin Integrity: Goal: Risk for impaired skin integrity will decrease Outcome: Progressing   Problem: Education: Goal: Ability to describe self-care measures that may prevent or decrease complications (Diabetes Survival Skills Education) will improve Outcome: Progressing   Problem: Coping: Goal: Ability to adjust to condition or change in health will improve Outcome: Progressing   Problem: Fluid Volume: Goal: Ability to maintain a balanced intake and output will improve Outcome: Progressing   Problem: Health Behavior/Discharge  Planning: Goal: Ability to identify and utilize available resources and services will improve Outcome: Progressing Goal: Ability to manage health-related needs will improve Outcome: Progressing   Problem: Skin Integrity: Goal: Risk for impaired skin integrity will decrease Outcome: Progressing   Problem: Tissue Perfusion: Goal: Adequacy of tissue perfusion will improve Outcome: Progressing

## 2023-06-06 NOTE — Discharge Summary (Signed)
 Physician Discharge Summary  [name] HYQ:[id number] DOB: 01-01-1966 DOA: 06/03/2023  PCP: Joycelyn Rua, MD  Admit date: 06/03/2023  Discharge date: 06/06/2023  Admitted From: Home  Disposition:  Home  Recommendations for Outpatient Follow-up:  Follow up with PCP in 1-2 weeks.. Please obtain BMP/CBC in one week Advised to take Augmentin twice a day for 7 days.   Advised to follow-up with oncologist [name] as scheduled.  Home Health:None Equipment/Devices:None  Discharge Condition: Stable CODE STATUS:Full code Diet recommendation: Heart Healthy   Brief Summary / Hospital Course: This 58 year old Male with PMH significant for DM2, metastatic colon cancer, chronic anemia, thrombocytopenia, HTN, HLD, DVT on chronic Pradaxa, asthma admitted from oncology office for concerns of abscess involving gluteal fold. Upon admission started on IV antibiotics.  Patient was also continued on the pain medications.  General surgery was consulted curbside.  CT abdomen and pelvis was ordered which does not show any abscess.  Patient reports pain has improved. There is no indication for surgery consultation.  Patient feels better and wants to be discharged.  Patient is being discharged home on Augmentin.  Advised to follow-up with Dr. Shirline Frees for colon cancer.  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   Previously has had pilonidal cyst requiring surgical drainage.   Continue broad-spectrum IV antibiotics.  CT A/P probable splenic hematoma, status post left  nephrectomy, grossly stable diffuse osseous metastatic Dr. Nelson Chimes discussed with Gen Surgery. CT doesn't show any abscess. Since there is no abscess. Patient being discharged home on Augmentin for 7 days.   Generalized weakness: PT/OT evaluation > No PT needs.   Diabetes mellitus type 2 Continue long-acting insulin, sliding scale and Accu-Chek.   Adjust as necessary   Hypomagnesemia: Replaced. Continue to monitor.   History of metastatic colon cancer: Patient has been having significant diffuse pain due to metastatic disease.   Discussed with Dr [name] who plans on giving outpatient chemotherapy once recovered from this acute infection.   Anemia of chronic disease: Thrombocytopenia: Hemoglobin around baseline of 10.  Today 7.6, likely delusional. Monitor H/H , May require blood transfusion if Hb drops further.   Essential hypertension Continue Toprol-XL.    Hyperlipidemia: Continue Statin.   Prior history of DVT: Continue  Pradaxa.   Asthma: Appears to be stable.  Continue home bronchodilators  Discharge Instructions  Discharge Instructions     Call MD for:  difficulty breathing, headache or visual disturbances   Complete by: As directed    Call MD for:  persistant dizziness or light-headedness   Complete by: As directed    Call MD for:  persistant nausea and vomiting   Complete by: As directed    Diet - low sodium heart healthy   Complete by: As directed    Diet Carb Modified   Complete by: As directed    Discharge instructions   Complete by: As directed    Advised to follow-up with primary care physician in 1 week. Advised to take Augmentin twice a day for 7 days.   Advised to follow-up with oncologist [name]  as scheduled.   Increase activity slowly   Complete by: As directed       Allergies as of 06/06/2023       Reactions   Cat Dander Anaphylaxis   Empagliflozin Other (See Comments)   Recurrent UTI        Medication List     STOP taking  these medications    HYDROmorphone 4 MG tablet Commonly known as: DILAUDID       TAKE these medications    acetaminophen 500 MG tablet Commonly known as: TYLENOL Take 1 tablet (500 mg total) by mouth daily as needed for mild pain.   albuterol 108 (90 Base) MCG/ACT inhaler Commonly known as: VENTOLIN HFA Inhale 1-2 puffs into the lungs every 6 (six) hours as needed for wheezing or shortness of breath.   amoxicillin-clavulanate 500-125 MG tablet Commonly known as: Augmentin Take 1 tablet by mouth 3 (three) times daily for 7 days.   aspirin EC 81 MG tablet Take 1 tablet (81 mg total) by mouth daily. Swallow whole.   atorvastatin 10 MG tablet Commonly known as: LIPITOR Take 1 tablet (10 mg total) by mouth daily.   calcium carbonate 750 MG chewable tablet Commonly known as: TUMS EX Chew 1,500 mg by mouth daily as needed for heartburn.   dabigatran 150 MG Caps capsule Commonly known as: PRADAXA Take 150 mg by mouth 2 (two) times daily.   dexamethasone 4 MG tablet Commonly known as: DECADRON Take 1 tablet (4 mg total) by mouth daily. Start on 05/15/2023   FISH OIL PO Take 1,000 mg by mouth 2 (two) times daily.   ibuprofen 200 MG tablet Commonly known as: ADVIL Take 200 mg by mouth daily as needed for mild pain.   lidocaine-prilocaine cream Commonly known as: EMLA Apply 1 Application topically as needed (port irritation).   lisinopril 5 MG tablet Commonly known as: ZESTRIL Take 5 mg by mouth daily.   loratadine 10 MG tablet Commonly known as: CLARITIN Take 10 mg by mouth daily as needed for allergies.   metFORMIN 1000 MG tablet Commonly known as: GLUCOPHAGE Take 1 tablet (1,000 mg total) by mouth 2 (two) times daily.   metoprolol succinate 25 MG 24 hr tablet Commonly known as: TOPROL-XL Take 25 mg by mouth daily.   mometasone 220 MCG/INH inhaler Commonly known as: ASMANEX Inhale 1 puff into the lungs daily.   morphine 30 MG 12 hr tablet Commonly known  as: MS CONTIN Take 1 tablet (30 mg total) by mouth every 12 (twelve) hours for 14 days.   naloxone 4 MG/0.1ML Liqd nasal spray kit Commonly known as: NARCAN For use if Opoid overdose is suspected   ONE TOUCH ULTRA TEST test strip Generic drug: glucose blood 1 each by Other route as needed (GLUCOSE).   pioglitazone 30 MG tablet Commonly known as: ACTOS Take 30 mg by mouth daily.   polyethylene glycol powder 17 GM/SCOOP powder Commonly known as: GLYCOLAX/MIRALAX Dissolve 17 g in liquid and take by mouth daily.   prochlorperazine 10 MG tablet Commonly known as: COMPAZINE TAKE 1 TABLET BY MOUTH EVERY 6 HOURS AS NEEDED FOR NAUSEA OR VOMITING. What changed: reasons to take this   repaglinide 2 MG tablet Commonly known as: PRANDIN Take 2 mg by mouth 2 (two) times daily.   Stool Softener/Laxative 50-8.6 MG tablet Generic drug: senna-docusate Take 1 tablet by mouth at bedtime.   Toujeo SoloStar 300 UNIT/ML Solostar Pen Generic drug: insulin glargine (1 Unit Dial) Inject 41 Units into  the skin daily.   traZODone 50 MG tablet Commonly known as: DESYREL Take 1 tablet (50 mg total) by mouth at bedtime as needed for sleep.        Follow-up Information     Joycelyn Rua, MD Follow up in 1 week(s).   Specialty: Family Medicine Contact information: [address] [place] [address] [phone]         Ladene Artist, MD Follow up in 1 week(s).   Specialty: Oncology Contact information: [address] [place] [address] [phone]                Allergies  Allergen Reactions   Cat Dander Anaphylaxis   Empagliflozin Other (See Comments)    Recurrent UTI    Consultations: None   Procedures/Studies: CT ABDOMEN PELVIS W CONTRAST Result Date: 06/04/2023 CLINICAL DATA:  Acute generalized abdominal pain. History of metastatic colon cancer. EXAM: CT ABDOMEN AND PELVIS WITH CONTRAST TECHNIQUE: Multidetector CT imaging of the abdomen  and pelvis was performed using the standard protocol following bolus administration of intravenous contrast. RADIATION DOSE REDUCTION: This exam was performed according to the departmental dose-optimization program which includes automated exposure control, adjustment of the mA and/or kV according to patient size and/or use of iterative reconstruction technique. CONTRAST:  OMNIPAQUE IOHEXOL 300 MG/ML  SOLN COMPARISON:  April 04, 2023.  May 09, 2022. FINDINGS: Lower chest: No acute abnormality. Hepatobiliary: Small gallstone. No biliary dilatation. Liver is unremarkable. Pancreas: Unremarkable. No pancreatic ductal dilatation or surrounding inflammatory changes. Spleen: 11 x 9.1 cm complex lesion seen in the spleen as described on prior exam which may represent hamartoma as noted on prior studies. Adrenals/Urinary Tract: Adrenal glands are unremarkable. Status post left nephrectomy. No hydronephrosis or renal obstruction. Urinary bladder is unremarkable. Stomach/Bowel: Stomach is unremarkable. There is no evidence of bowel obstruction or inflammation. Appendix appears normal. Vascular/Lymphatic: Aortic atherosclerosis. No enlarged abdominal or pelvic lymph nodes. Reproductive: Prostate is unremarkable. Other: No ascites or hernia is noted. Musculoskeletal: Grossly stable sclerotic densities noted throughout skeleton consistent with osseous metastases. IMPRESSION: Small gallstone. 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 Grossly stable diffuse osseous sclerotic densities consistent with osseous metastases. Aortic Atherosclerosis (ICD10-I70.0). Electronically Signed   By: Lupita Raider M.D.   On: 06/04/2023 19:14   DG FL GUIDED LUMBAR PUNCTURE Result Date: 05/27/2023 CLINICAL DATA:  Patient with history of metastatic colon cancer, back pain with lower extremity weakness/ gait dysfunction; lumbar puncture requested to assess for possible carcinomatous meningitis EXAM: LUMBAR PUNCTURE  UNDER FLUOROSCOPY PROCEDURE: An appropriate skin entry site was determined fluoroscopically. Operator donned sterile gloves and mask. Skin site was marked, then prepped with Betadine, draped in usual sterile fashion, and infiltrated locally with 1% lidocaine. A 20 gauge spinal needle advanced into the thecal sac at L 3-4 from an interlaminar approach. Clear colorless CSF spontaneously returned, with no significant elevated opening pressure; 13 ml CSF were collected and divided among 4 sterile vials for the requested laboratory studies. The needle was then removed. The patient tolerated the procedure well and there were no complications. FLUOROSCOPY: Radiation Exposure Index (as provided by the fluoroscopic device): 6.9 mGy Kerma IMPRESSION: Successful diagnostic lumbar puncture under fluoroscopy. Clear CSF sample returned, submitted for analysis as requested. This exam was performed by Artemio Aly and was supervised and interpreted by Dr. Roanna Banning. Electronically Signed   By: Roanna Banning M.D.   On: 05/27/2023 16:22   MR CERVICAL SPINE W WO CONTRAST Result  Date: 05/26/2023 CLINICAL DATA:  Initial evaluation for metastatic disease evaluation, myelopathy. EXAM: MRI CERVICAL SPINE WITHOUT AND WITH CONTRAST TECHNIQUE: Multiplanar and multiecho pulse sequences of the cervical spine, to include the craniocervical junction and cervicothoracic junction, were obtained without and with intravenous contrast. CONTRAST:  8mL GADAVIST GADOBUTROL 1 MMOL/ML IV SOLN COMPARISON:  Prior study from 02/04/2022 FINDINGS: Alignment: Straightening with mild reversal of the normal cervical lordosis. No significant listhesis. Vertebrae: Diffusely abnormal and heterogeneous appearance of the visualized bone marrow, consistent with diffuse osseous metastatic disease. There is involvement of essentially all levels including the skull base. Small amount of extraosseous/epidural extension noted within the right ventral epidural space  at T1 (series 6, image 7). No other significant extra osseous or epidural extension of tumor. No pathologic compression fracture. Cord: Normal signal and morphology. Posterior Fossa, vertebral arteries, paraspinal tissues: Unremarkable. Disc levels: C2-C3: Negative interspace. Mild left-sided facet hypertrophy. No spinal stenosis. Foramina remain patent. C3-C4: Small left paracentral disc protrusion mildly flattens and indents the left ventral thecal sac. Superimposed bilateral uncovertebral spurring with mild left-sided facet hypertrophy. No significant spinal stenosis. Mild left C4 foraminal narrowing. Right neural foramen remains patent. C4-C5: Degenerative intervertebral disc space narrowing with diffuse disc bulge and bilateral uncovertebral spurring. No spinal stenosis. Mild left C5 foraminal stenosis. Right neural foramina remains patent. C5-C6: Mild disc bulge with bilateral uncovertebral spurring. Mild left-sided facet spurring. No significant spinal stenosis. Mild left C6 foraminal narrowing. Right neural foramina remains patent. C6-C7: Degenerative disc space narrowing with diffuse disc bulge and bilateral uncovertebral spurring, slightly asymmetric to the left. No significant spinal stenosis. Foramina remain patent. C7-T1: Degenerate intervertebral disc space narrowing with diffuse disc bulge and bilateral uncovertebral spurring. No significant spinal stenosis. Foramina remain patent. IMPRESSION: 1. Diffuse osseous metastatic disease involving the cervical spine. Small amount of extraosseous/epidural extension of tumor within the right ventral epidural space at T1 without stenosis. No other significant extra osseous or epidural extension of tumor. No pathologic compression fracture. 2. Underlying mild multilevel cervical spondylosis without significant spinal stenosis. Mild left C4 through C6 foraminal narrowing. Electronically Signed   By: Rise Mu M.D.   On: 05/26/2023 06:42   MR BRAIN W  WO CONTRAST Result Date: 05/26/2023 CLINICAL DATA:  Initial evaluation for metastatic disease evaluation. EXAM: MRI HEAD WITHOUT AND WITH CONTRAST TECHNIQUE: Multiplanar, multiecho pulse sequences of the brain and surrounding structures were obtained without and with intravenous contrast. CONTRAST:  8mL GADAVIST GADOBUTROL 1 MMOL/ML IV SOLN COMPARISON:  Comparison made with prior MRI from 03/01/2014. FINDINGS: Brain: Cerebral volume within normal limits. Patchy T2/FLAIR hyperintensity involving the supratentorial cerebral white matter, most consistent with chronic small vessel ischemic disease, mild in nature. No evidence for acute or subacute infarct. No acute intracranial hemorrhage. Few scattered chronic micro hemorrhages noted, most prominent of which present at the left lentiform nucleus. Punctate focus of enhancement noted at the left parieto-occipital junction (series 16, image 101). Additional punctate focus of enhancement noted within the left parietal lobe superiorly (series 16, image 116). Findings are nonspecific. No other mass lesion or abnormal enhancement. No other evidence for intra-axial metastatic disease. No mass effect or midline shift. No hydrocephalus or extra-axial fluid collection. Pituitary gland suprasellar region within normal limits. Vascular: Major intracranial vascular flow voids are maintained. Skull and upper cervical spine: Craniocervical junction within normal limits. Heterogeneous signal intensity within the visualized bone marrow with a few probable marrow replacing lesions about the skull base and calvarium, consistent with osseous metastatic disease. No visible  extra osseous extension of tumor. No scalp soft tissue abnormality. Sinuses/Orbits: Globes and orbital soft tissues within normal limits. Paranasal sinuses are largely clear. Trace left mastoid effusion noted, of doubtful significance. Other: None. IMPRESSION: 1. Two punctate foci of enhancement involving the left  parieto-occipital region as above. While these findings are nonspecific, tiny metastatic lesions are suspected given patient history. A short interval follow-up MRI could be performed for further evaluation and to evaluate for interval change as warranted. 2. Heterogeneous bone marrow signal intensity with a few marrow replacing lesions about the skull base and calvarium, consistent with osseous metastatic disease. 3. No other acute intracranial abnormality. 4. Punctate underlying mild chronic microvascular ischemic disease. Electronically Signed   By: Rise Mu M.D.   On: 05/26/2023 06:32   MR Lumbar Spine W Wo Contrast Result Date: 05/24/2023 CLINICAL DATA:  Metastatic disease evaluation EXAM: MRI THORACIC AND LUMBAR SPINE WITHOUT AND WITH CONTRAST TECHNIQUE: Multiplanar and multiecho pulse sequences of the thoracic and lumbar spine were obtained without and with intravenous contrast. CONTRAST:  8mL GADAVIST GADOBUTROL 1 MMOL/ML IV SOLN COMPARISON:  None Available. FINDINGS: MRI THORACIC SPINE FINDINGS Alignment:  Levoscoliosis.  No static subluxation. Vertebrae: There is diffuse osseous metastatic disease affecting all of the thoracic levels. There is no acute compression fracture. Cord:  Normal signal and morphology. Paraspinal and other soft tissues: Limited visualization due to motion. Disc levels: There is no spinal canal stenosis. No abnormal epidural soft tissue. MRI LUMBAR SPINE FINDINGS Segmentation:  Standard. Alignment: S shaped scoliosis with right apex at L1 and left apex at L5. Vertebrae: Diffuse osseous metastatic disease throughout the lumbar spine and sacrum. There is mild multilevel height loss but no acute fracture. Conus medullaris: Extends to the L1 level and appears normal. Paraspinal and other soft tissues: Incompletely visualized left abdominal mass measuring at least 11.6 cm. Disc levels: L1-L2: Normal disc space and facet joints. No spinal canal stenosis. No neural foraminal  stenosis. L2-L3: Small disc bulge. No spinal canal stenosis. No neural foraminal stenosis. L3-L4: Small disc bulge. No spinal canal stenosis. Mild bilateral neural foraminal stenosis. L4-L5: Small right asymmetric disc bulge and mild facet arthrosis. No spinal canal stenosis. Worsened severe right and moderate left neural foraminal stenosis. L5-S1: Normal disc space and facet joints. No spinal canal stenosis. No neural foraminal stenosis. Visualized sacrum: Normal. IMPRESSION: 1. Diffuse osseous metastatic disease throughout the thoracic and lumbar spine and sacrum. No acute fracture or spinal canal stenosis. 2. Incompletely visualized left abdominal mass measuring at least 11.6 cm. 3. Worsened severe right and moderate left L4-L5 neural foraminal stenosis due to combination of disc bulge and facet arthrosis. Electronically Signed   By: Deatra Robinson M.D.   On: 05/24/2023 21:51   MR THORACIC SPINE W WO CONTRAST Result Date: 05/24/2023 CLINICAL DATA:  Metastatic disease evaluation EXAM: MRI THORACIC AND LUMBAR SPINE WITHOUT AND WITH CONTRAST TECHNIQUE: Multiplanar and multiecho pulse sequences of the thoracic and lumbar spine were obtained without and with intravenous contrast. CONTRAST:  8mL GADAVIST GADOBUTROL 1 MMOL/ML IV SOLN COMPARISON:  None Available. FINDINGS: MRI THORACIC SPINE FINDINGS Alignment:  Levoscoliosis.  No static subluxation. Vertebrae: There is diffuse osseous metastatic disease affecting all of the thoracic levels. There is no acute compression fracture. Cord:  Normal signal and morphology. Paraspinal and other soft tissues: Limited visualization due to motion. Disc levels: There is no spinal canal stenosis. No abnormal epidural soft tissue. MRI LUMBAR SPINE FINDINGS Segmentation:  Standard. Alignment: S shaped scoliosis with right  apex at L1 and left apex at L5. Vertebrae: Diffuse osseous metastatic disease throughout the lumbar spine and sacrum. There is mild multilevel height loss but no  acute fracture. Conus medullaris: Extends to the L1 level and appears normal. Paraspinal and other soft tissues: Incompletely visualized left abdominal mass measuring at least 11.6 cm. Disc levels: L1-L2: Normal disc space and facet joints. No spinal canal stenosis. No neural foraminal stenosis. L2-L3: Small disc bulge. No spinal canal stenosis. No neural foraminal stenosis. L3-L4: Small disc bulge. No spinal canal stenosis. Mild bilateral neural foraminal stenosis. L4-L5: Small right asymmetric disc bulge and mild facet arthrosis. No spinal canal stenosis. Worsened severe right and moderate left neural foraminal stenosis. L5-S1: Normal disc space and facet joints. No spinal canal stenosis. No neural foraminal stenosis. Visualized sacrum: Normal. IMPRESSION: 1. Diffuse osseous metastatic disease throughout the thoracic and lumbar spine and sacrum. No acute fracture or spinal canal stenosis. 2. Incompletely visualized left abdominal mass measuring at least 11.6 cm. 3. Worsened severe right and moderate left L4-L5 neural foraminal stenosis due to combination of disc bulge and facet arthrosis. Electronically Signed   By: Deatra Robinson M.D.   On: 05/24/2023 21:51   DG Lumbar Spine Complete Result Date: 05/20/2023 CLINICAL DATA:  Metastatic colon cancer with bone metastases. Increased lower back pain over the past week. Recently completed radiation therapy to T11-L4. EXAM: LUMBAR SPINE - COMPLETE 4+ VIEW COMPARISON:  Lumbar spine radiographs 04/11/2022. FINDINGS: Five views of the lumbar spine. Conventional numbering is assumed with 5 non-rib-bearing, lumbar type vertebral bodies. Unchanged multilevel compression deformities from T11 through L4. Increased sclerosis of the these vertebral bodies as well as L5 in the sacrum. Unchanged dextroscoliotic curvature of the upper lumbar spine and retrolisthesis of L2 on L3. IMPRESSION: Unchanged multilevel compression deformities from T11 through L4. Increased sclerosis of the  these vertebral bodies as well as L5 and the sacrum, likely treatment related. Electronically Signed   By: Orvan Falconer M.D.   On: 05/20/2023 13:44     Subjective: Patient seen and examined at bedside.  Overnight events noted.   Patient report doing much better.  He wants to be discharged.  Discharge Exam: Vitals:   06/06/23 0835 06/06/23 0916  BP:  133/81  Pulse:  87  Resp:    Temp:    SpO2: 96%    Vitals:   06/05/23 1317 06/05/23 2037 06/06/23 0835 06/06/23 0916  BP: 139/83 139/82  133/81  Pulse: 92 (!) 107  87  Resp: 20 18    Temp: 98.6 F (37 C) 99.1 F (37.3 C)    TempSrc: Oral Oral    SpO2: 95% 94% 96%     General: Pt is alert, awake, not in acute distress Cardiovascular: RRR, S1/S2 +, no rubs, no gallops Respiratory: CTA bilaterally, no wheezing, no rhonchi Abdominal: Soft, NT, ND, bowel sounds + Extremities: no edema, no cyanosis    The results of significant diagnostics from this hospitalization (including imaging, microbiology, ancillary and laboratory) are listed below for reference.     Microbiology: No results found for this or any previous visit (from the past 240 hours).   Labs: BNP (last 3 results) No results for input(s): "BNP" in the last 8760 hours. Basic Metabolic Panel: Recent Labs  Lab 06/03/23 0847 06/04/23 0500 06/05/23 0519 06/06/23 0606  NA 129* 128* 129* 129*  K 4.5 3.9 4.1 4.4  CL 94* 96* 97* 98  CO2 24 21* 23 24  GLUCOSE 204* 107* 85 127*  BUN 26* 20 13 13   CREATININE 0.71 0.57* 0.56* 0.59*  CALCIUM 8.4* 7.6* 7.3* 7.4*  MG  --  1.5* 2.0 1.7  PHOS  --   --  3.4 3.6   Liver Function Tests: Recent Labs  Lab 06/03/23 0847 06/04/23 0500 06/05/23 0519 06/06/23 0606  AST 22 21 22 24   ALT 9 11 12 14   ALKPHOS 933* 626* 572* 498*  BILITOT 0.6 0.7 0.5 0.5  PROT 6.5 5.5* 5.3* 5.1*  ALBUMIN 3.3* 2.1* 1.8* 1.7*   No results for input(s): "LIPASE", "AMYLASE" in the last 168 hours. No results for input(s): "AMMONIA" in the  last 168 hours. CBC: Recent Labs  Lab 06/03/23 0847 06/04/23 0500 06/05/23 0519 06/06/23 0606  WBC 10.0 7.0 6.9 6.2  NEUTROABS 8.1* 5.2  --   --   HGB 9.4* 7.8* 7.6* 7.5*  HCT 28.4* 25.3* 24.3* 23.3*  MCV 98.6 103.7* 102.5* 100.9*  PLT 123* 105* 98* 101*   Cardiac Enzymes: No results for input(s): "CKTOTAL", "CKMB", "CKMBINDEX", "TROPONINI" in the last 168 hours. BNP: Invalid input(s): "POCBNP" CBG: Recent Labs  Lab 06/05/23 1143 06/05/23 1658 06/05/23 2114 06/06/23 0727 06/06/23 1128  GLUCAP 187* 148* 235* 103* 124*   D-Dimer No results for input(s): "DDIMER" in the last 72 hours. Hgb A1c No results for input(s): "HGBA1C" in the last 72 hours. Lipid Profile No results for input(s): "CHOL", "HDL", "LDLCALC", "TRIG", "CHOLHDL", "LDLDIRECT" in the last 72 hours. Thyroid function studies No results for input(s): "TSH", "T4TOTAL", "T3FREE", "THYROIDAB" in the last 72 hours.  Invalid input(s): "FREET3" Anemia work up No results for input(s): "VITAMINB12", "FOLATE", "FERRITIN", "TIBC", "IRON", "RETICCTPCT" in the last 72 hours. Urinalysis    Component Value Date/Time   COLORURINE YELLOW 07/22/2022 0822   APPEARANCEUR CLEAR 07/22/2022 0822   LABSPEC 1.026 07/22/2022 0822   PHURINE 6.0 07/22/2022 0822   GLUCOSEU NEGATIVE 07/22/2022 0822   HGBUR NEGATIVE 07/22/2022 0822   BILIRUBINUR NEGATIVE 07/22/2022 0822   KETONESUR NEGATIVE 07/22/2022 0822   PROTEINUR 30 (A) 06/03/2023 0847   UROBILINOGEN 0.2 08/17/2014 1831   NITRITE NEGATIVE 07/22/2022 0822   LEUKOCYTESUR NEGATIVE 07/22/2022 0822   Sepsis Labs Recent Labs  Lab 06/03/23 0847 06/04/23 0500 06/05/23 0519 06/06/23 0606  WBC 10.0 7.0 6.9 6.2   Microbiology No results found for this or any previous visit (from the past 240 hours).   Time coordinating discharge: Over 30 minutes  SIGNED:   [name], MD  [facility] 06/06/2023, 1:22 PM Pager   If 7PM-7AM, please contact  night-coverage

## 2023-06-07 ENCOUNTER — Other Ambulatory Visit: Payer: Self-pay | Admitting: Oncology

## 2023-06-07 DIAGNOSIS — C186 Malignant neoplasm of descending colon: Secondary | ICD-10-CM

## 2023-06-07 NOTE — Progress Notes (Signed)
 DISCONTINUE ON PATHWAY REGIMEN - Colorectal     A cycle is every 14 days:     Bevacizumab-xxxx      Irinotecan      Leucovorin      Fluorouracil      Fluorouracil   **Always confirm dose/schedule in your pharmacy ordering system**  PRIOR TREATMENT: [id number]: FOLFIRI + Bevacizumab q14 Days  START ON PATHWAY REGIMEN - Colorectal     A cycle is every 14 days:     Bevacizumab-xxxx      Oxaliplatin      Leucovorin      Fluorouracil      Fluorouracil   **Always confirm dose/schedule in your pharmacy ordering system**  Patient Characteristics: Distant Metastases, Nonsurgical Candidate, Non-KRAS G12C, 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

## 2023-06-08 ENCOUNTER — Telehealth: Payer: Self-pay | Admitting: *Deleted

## 2023-06-08 NOTE — Progress Notes (Signed)
 Initial neurology clinic note  Reason for Evaluation: Consultation requested by Rejeana Brock,* for an opinion regarding weakness. My final recommendations will be communicated back to the requesting physician by way of shared medical record or letter to requesting physician via [place] mail.  HPI: This is [name], a 58 y.o. 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 The patient is accompanied by sister and wife.  Patient has metastatic adenocarcinoma of colon to bones and is having a lot of pain in spine, hips, femurs, and shoulders. He is on a lot of pain medication for this. He is not very functional during the day. He can use a walker and get around his house, but this is about all he can do. He is very exhausted after this little activity. He endorses muscle aches, particularly in his back. Patient was getting IV steroids with chemo. He had been getting this for about 1 year. Steroids helped with pain. He stopped steroids during recent hospitalization.  Patient was admitted in 05/2023 for lower back pain and leg weakness over several months. Certain movements will cause shocklike sensations down his legs as well. Steroids has helped back pain. Neurology was consulted in the hospital and was concerned for steroid or chemotherapy induced myopathy due to proximal muscle weakness. MRIs of the neuroaxis were recommended. MRI brain was concerning for possible metastatic lesions. MRI cervical spine showed diffuse osseous metastatic disease involving the cervical spine with possible epidural extension of tumor at T1 without stenosis. Similarly, there was diffuse osseous metastatic disease throughout the thoracic and lumbar spine and sacrum and worsened severe right  and moderate left L4-5 neuroforaminal stenosis. LP was also obtained. It showed mildly elevated protein (64). Paraneoplastic panel sent to [facility] showed a mildly elevated GAD65 ab. Patient also had an infection of left pubic region and sacrum - got IV antibiotics during admission from 06/02/33-06/06/23.  Since hospitalization, patient feels a little bit stronger and more mobile.  Patient's primary concern today is energy levels and ability to mobilize. Patient has not done PT since 2015.  He has a history of neuropathy due to chemotherapy. This is mild per patient (tingling in toes and fingers). It does not bother him, but he has switched back to the chemo recently (FOLFOX).  Cancer history per oncology note from 06/03/23: Adenocarcinoma of the descending colon, stage IIIC (T4N2A), moderately differentiated adenocarcinoma with mucinous and signet cell features, status post a left colectomy 03/04/2019 Mass felt to be in the transverse colon on a colonoscopy 01/07/2019 with a biopsy confirming poorly differentiated adenocarcinoma with signet ring cell features, mismatch repair protein expression normal CT abdomen/pelvis 01/08/2019 12.3 cm indeterminate splenic mass, present in 2015 on a lumbar MRI-felt to be benign, small subpleural and pleural-based nodules at the lung bases, status post left nephrectomy CT chest 01/20/2019-small bilateral pulmonary nodules with rounded parenchymal nodules in the right lower lobe, indeterminate splenic mass CT chest 03/18/2019-multiple subcentimeter pulmonary nodules again seen bilaterally, greatest in the lower lobes, without significant change.  No new or enlarging pulmonary nodules or masses.  Partially visualized large heterogeneous splenic mass with no significant change.  Plan for follow-up chest CT at a 65-month interval. Cycle 1 FOLFOX 03/21/2019 Cycle 2 FOLFOX 04/04/2019 Cycle 3 FOLFOX 04/18/2019 (oxaliplatin dose reduced secondary to thrombocytopenia) Cycle  4 FOLFOX  05/02/2019 (oxaliplatin held secondary to thrombocytopenia) Cycle 5 FOLFOX 05/16/2019 Cycle 6 FOLFOX 05/30/2019 (oxaliplatin held secondary to thrombocytopenia) CT chest 06/08/2019-stable scattered small solid pulmonary nodules  Cycle 7 FOLFOX 06/13/2019 Cycle 8 FOLFOX 06/27/2019 Cycle 9 FOLFOX 07/11/2019 Cycle 10 FOLFOX 07/25/2019 (oxaliplatin held due to thrombocytopenia) Cycle 11 FOLFOX 08/08/2019 Cycle 12 FOLFOX 08/22/2019 CTs 01/09/2020-no evidence of recurrent disease, stable lung nodules favored to represent subpleural lymph nodes-dedicated follow-up not recommended CTs 01/11/2021-no evidence of recurrent disease CTs 01/24/2022-no evidence of recurrent disease CT lumbar spine, abdomen/pelvis 04/11/2022-numerous mixed lytic and sclerotic lesions scattered throughout the visualized axial and appendicular skeleton including several areas in the lumbar spine.   04/21/2022 CT biopsy sclerotic lesion right anterior iliac bone-consistent with metastatic colorectal adenocarcinoma, moderate to poorly differentiated.  Foundation 1-microsatellite stable, tumor mutation burden 4, K-ras G12S PET scan 05/09/2022-widespread hypermetabolic mixed faintly lytic and sclerotic osseous metastases throughout the axial and proximal appendicular skeleton; associated mild pathologic compression fracture at L3; no hypermetabolic extraosseous metastatic disease. Cycle 1 FOLFIRI/bevacizumab 05/26/2022 Cycle 2 FOLFIRI/bevacizumab 06/09/2022 Cycle 3 FOLFIRI 06/24/2022, bevacizumab 06/26/2022 Cycle 4 FOLFIRI 07/08/2022, bevacizumab held due to proteinuria Cycle 5 FOLFIRI 07/22/2022, bevacizumab held due to proteinuria CTs 07/31/2022-potentially areas of sclerosis represent interval healing; new lytic lesion at T12, multiple new areas of punched-out sclerosis throughout the visualized osseous structures.  Index lesion in the left iliac wing measures 1.3 x 2.8 cm.  Healing of previously metabolically occult metastatic disease on 05/09/2022 is a  possibility. Cycle 6 FOLFIRI 08/04/2022, irinotecan dose reduced due to thrombocytopenia, bevacizumab held pending 24-hour urine result Cycle 7 FOLFIRI 08/18/2022, bevacizumab held secondary to proteinuria, G-CSF held Cycle 8 FOLFIRI 09/01/2022, bevacizumab held, G-CSF Cycle 9 FOLFIRI 09/16/2022, bevacizumab held, G-CSF CTs 09/29/2022-enlargement of a hypodense lesion posterior left lobe of the liver, similar diffuse mixed lytic and sclerotic osseous metastatic disease, multiple new small nodules medial right lung base, multiple other small bilateral pulmonary nodules unchanged. Cycle 10 FOLFIRI plus Avastin 10/08/2022 Cycle 11 FOLFIRI 10/27/2022, Avastin held Cycle 12 FOLFIRI/Avastin 11/11/2022 Cycle 13 FOLFIRI/Avastin 11/25/2022 CTs 12/08/2022-stable lesion posterior left lobe of the liver.  Unchanged diffuse lytic and sclerotic osseous metastatic disease.  Stable small solid pulmonary nodules.  Stable splenic lesions.  Slightly increased bladder wall thickening. Cycle 14 FOLFIRI/Avastin 12/10/2022 Cycle 15 FOLFIRI/Avastin 12/24/2022 12/31/2022 24-hour urine protein 710 mg Cycle 16 FOLFIRI/Avastin 01/07/2023 Cycle 17 FOLFIRI 01/21/2023, Avastin held secondary to persistent proteinuria, slight increase in creatinine, and drainage from the suprapubic lesion CTs 01/26/2023-unchanged widespread sclerotic osseous metastatic disease, unchanged hypodense liver lesions, occasional small bilateral lung nodules unchanged. Cycle 18 FOLFIRI 02/03/2023, Avastin held Cycle 19 FOLFIRI 02/17/2023, Avastin held Cycle 20 FOLFIRI 03/03/2023, Avastin held Cycle 21 FOLFIRI 03/16/2023, Avastin held CTs 04/04/2023: Right increased sclerosis of osseous metastatic disease with new sclerotic lesions of the sternum/manubrium and increase sclerotic metastases throughout the pelvis, unchanged hypodense liver lesions Cycle 22 FOLFIRI 04/07/2023, Avastin held Cycle 23 FOLFIRI/Avastin 04/22/2023 Cycle 24 FOLFIRI/Avastin 05/06/2023   Multiple  colon polyps-ascending colon polyps on the colonoscopy 01/07/2019-not removed Colonoscopy 01/03/2020-multiple polyps removed, tubular adenomas, inflammatory and hyperplastic polyps Wilms tumor at age 83, 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  Left nephrectomy at age 24 12.  Port-A-Cath placement, Dr. Michaell Cowing, 03/16/2019 13.  Thrombocytopenia secondary to chemotherapy-oxaliplatin dose reduced beginning with cycle 3  FOLFOX, improved 14.  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  Right groin soft tissue infection May 2023-status postsurgical debridement 18.  Left leg DVT-common femoral, femoral, popliteal, posterior tibial, and peroneal veins 19.  02/04/2022-MRI cervical spine-1.7 cm T1 lesion-indeterminate; bone scan 03/14/2022-focal intense activity at approximate T1 level felt to correspond to the lesion on MRI, additional foci of activity involving the lower thoracic and lumbar spine, right iliac bone and pubic symphysis.  CT lumbar spine, abdomen/pelvis 04/11/2022-numerous mixed lytic and sclerotic lesions scattered throughout the visualized axial and appendicular skeleton including several areas in the lumbar spine.  04/21/2022 CT biopsy sclerotic lesion right anterior iliac bone-consistent with metastatic colorectal adenocarcinoma, moderate to poorly differentiated 20.  Inflamed sebaceous cyst 08/18/2022-doxycycline Surgical resection of area of "hidradenitis " 03/19/2023 21.  Admission 05/24/2023 with increased back pain and new onset leg weakness MRI thoracic and  lumbar spine 05/24/2023: Fuhs osseous metastatic disease, no acute compression fracture, normal cord signal but no spinal canal stenosis, no abnormal epidural soft tissue MRI cervical spine 05/25/2023: Diffuse osseous metastatic disease, small amount of extraosseous/epidural tumor at the right ventral epidural space at T1 without stenosis, no pathologic compression fracture MRI brain 05/25/2023: 2 punctate foci of enhancement in the left parieto-occipital region-tiny metastases suspected, osseous metastatic disease Lumbar puncture 05/27/2023-cytology negative for malignant cells   The patient denies symptoms suggestive of oculobulbar weakness including diplopia, ptosis, dysphagia, poor saliva control, dysarthria/dysphonia, impaired mastication, facial weakness/droop.  There are no neuromuscular respiratory weakness symptoms, particularly orthopnea>dyspnea.   The patient does not report symptoms referable to autonomic dysfunction including impaired sweating, heat or cold intolerance, excessive mucosal dryness, gastroparetic early satiety, postprandial abdominal bloating, constipation, bowel or bladder dyscontrol, or syncope/presyncope/orthostatic intolerance.  He has lost about 15 lbs this year. Per wife, this is related to being in the hospital a lot and deconditioning.  Of note, patient takes 10 mg of Lipitor daily.  EtOH use: No  Restrictive diet? No Family history of neuropathy/myopathy/neurologic disease? No   MEDICATIONS:  Outpatient Encounter Medications as of 06/17/2023  Medication Sig Note   acetaminophen (TYLENOL) 500 MG tablet Take 1 tablet (500 mg total) by mouth daily as needed for mild pain.    albuterol (PROVENTIL HFA;VENTOLIN HFA) 108 (90 BASE) MCG/ACT inhaler Inhale 1-2 puffs into the lungs every 6 (six) hours as needed for wheezing or shortness of breath.    aspirin EC 81 MG EC tablet Take 1 tablet (81 mg total) by mouth daily. Swallow whole.    atorvastatin (LIPITOR) 10 MG  tablet Take 1 tablet (10 mg total) by mouth daily.    calcium carbonate (TUMS EX) 750 MG chewable tablet Chew 1,500 mg by mouth daily as needed for heartburn.    dabigatran (PRADAXA) 150 MG CAPS capsule Take 150 mg by mouth 2 (two) times daily.    dexamethasone (DECADRON) 4 MG tablet Take 1 tablet (4 mg total) by mouth daily. Start on 05/15/2023    HYDROmorphone (DILAUDID) 4 MG tablet Take 1 tablet (4 mg total) by mouth every 4 (four) hours as needed for severe pain (pain score 7-10).    ibuprofen (ADVIL) 200 MG tablet Take 200 mg by mouth daily as needed for mild pain.    insulin glargine, 1 Unit Dial, (TOUJEO SOLOSTAR) 300 UNIT/ML Solostar Pen Inject 41 Units into the skin daily.  lidocaine-prilocaine (EMLA) cream Apply 1 Application topically as needed (port irritation).    lisinopril (ZESTRIL) 5 MG tablet Take 5 mg by mouth daily.    loratadine (CLARITIN) 10 MG tablet Take 10 mg by mouth daily as needed for allergies.    metFORMIN (GLUCOPHAGE) 1000 MG tablet Take 1 tablet (1,000 mg total) by mouth 2 (two) times daily.    metoprolol succinate (TOPROL-XL) 25 MG 24 hr tablet Take 25 mg by mouth daily.    mometasone (ASMANEX) 220 MCG/INH inhaler Inhale 1 puff into the lungs daily.    morphine (MS CONTIN) 30 MG 12 hr tablet Take 1 tablet (30 mg total) by mouth every 12 (twelve) hours.    naloxone (NARCAN) nasal spray 4 mg/0.1 mL For use if Opoid overdose is suspected (Patient taking differently: Place 1 spray into the nose once. For use if Opoid overdose is suspected) 06/04/2023: Hasn't used   Omega-3 Fatty Acids (FISH OIL PO) Take 1,000 mg by mouth 2 (two) times daily.    ONE TOUCH ULTRA TEST test strip 1 each by Other route as needed (GLUCOSE).     pioglitazone (ACTOS) 30 MG tablet Take 30 mg by mouth daily.    polyethylene glycol powder (GLYCOLAX/MIRALAX) 17 GM/SCOOP powder Dissolve 17 g in liquid and take by mouth daily.    prochlorperazine (COMPAZINE) 10 MG tablet Take 1 tablet (10 mg total) by  mouth every 6 (six) hours as needed.    repaglinide (PRANDIN) 2 MG tablet Take 2 mg by mouth 2 (two) times daily.    senna-docusate (SENOKOT-S) 8.6-50 MG tablet Take 1 tablet by mouth at bedtime. 06/04/2023: unknown   [EXPIRED] amoxicillin-clavulanate (AUGMENTIN) 500-125 MG tablet Take 1 tablet by mouth 3 (three) times daily for 7 days.    traZODone (DESYREL) 50 MG tablet Take 1 tablet (50 mg total) by mouth at bedtime as needed for sleep. (Patient not taking: Reported on 06/17/2023)    [DISCONTINUED] morphine (MS CONTIN) 30 MG 12 hr tablet Take 1 tablet (30 mg total) by mouth every 12 (twelve) hours for 14 days.    [DISCONTINUED] prochlorperazine (COMPAZINE) 10 MG tablet TAKE 1 TABLET BY MOUTH EVERY 6 HOURS AS NEEDED FOR NAUSEA OR VOMITING. (Patient taking differently: Take 10 mg by mouth every 6 (six) hours as needed for vomiting or nausea.)    No facility-administered encounter medications on file as of 06/17/2023.    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   History of hiatal hernia    Hyperlipidemia    Hypertension    Diagnostic exercise tolerance test assessment:04/30/2010 : comments normal -no evidence os ischemia by ST analysis   lipoma    Lipoma 01/26/2019   Mitral regurgitation    Echo 12/2018: EF 60-65, myxomatous mitral valve, severe mitral regurgitation, partial flail leaflet of posterior mitral valve, myxomatous tricuspid valve with trivial TR, ascending aorta mildly dilated (39 mm)  Pneumonia     as child   PONV (postoperative nausea and vomiting)    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   Complex valvuloplasty including artificial Gore-tex neochord placement x6 with 28 mm Sorin Memo 4D ring annuloplasty via right mini thoracotomy approach   Solitary kidney    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    PAST SURGICAL HISTORY: Past Surgical History:  Procedure Laterality Date   ABDOMINAL AORTOGRAM N/A 02/10/2020   Procedure: ABDOMINAL AORTOGRAM;  Surgeon: Corky Crafts, MD;  Location: [facility] INVASIVE CV LAB;  Service: Cardiovascular;  Laterality: N/A;   APPLICATION OF WOUND VAC  08/15/2021   Procedure: APPLICATION OF WOUND VAC;  Surgeon: Emelia Loron, MD;  Location: [facility] OR;  Service: General;;   BIOPSY  01/07/2019   Procedure: BIOPSY;  Surgeon: Charlott Rakes, MD;  Location: [facility] ENDOSCOPY;  Service: Endoscopy;;   BIOPSY  01/03/2020   Procedure: BIOPSY;  Surgeon: Charlott Rakes, MD;  Location: [facility] ENDOSCOPY;  Service: Endoscopy;;   COLON SURGERY  03/04/2019   left colon segmental resection   COLONOSCOPY WITH PROPOFOL N/A 01/07/2019   Procedure: COLONOSCOPY WITH PROPOFOL;  Surgeon: Charlott Rakes, MD;  Location: [facility] ENDOSCOPY;  Service: Endoscopy;  Laterality: N/A;   COLONOSCOPY WITH PROPOFOL N/A 01/03/2020   Procedure: COLONOSCOPY WITH PROPOFOL;  Surgeon: Charlott Rakes, MD;  Location: [facility] ENDOSCOPY;  Service: Endoscopy;  Laterality: N/A;   INCISION AND DRAINAGE OF WOUND Right 08/13/2021   Procedure: IRRIGATION AND DEBRIDEMENT WOUND RIGHT GROIN;  Surgeon: Axel Filler, MD;  Location: [facility] OR;  Service: General;  Laterality: Right;   INGUINAL HERNIA REPAIR Left 04/23/2016   Procedure: LAPAROSCOPIC  INGUINAL REPAIR;  Surgeon: Berna Bue, MD;  Location: [facility] OR;  Service: General;  Laterality: Left;   IR IMAGING GUIDED PORT INSERTION  05/08/2022   IRRIGATION AND DEBRIDEMENT ABSCESS Right 08/15/2021   Procedure: IRRIGATION  AND DEBRIDEMENT OF GROIN;  Surgeon: Emelia Loron, MD;  Location: [facility] OR;  Service: General;  Laterality: Right;   IRRIGATION AND DEBRIDEMENT SEBACEOUS CYST N/A 03/19/2023   Procedure: DEBRIDEMENT OF CHRONIC INFECTION;  Surgeon: Romie Levee, MD;  Location: [facility];  Service: General;  Laterality: N/A;   KNEE SURGERY     arthroscopic left knee   LIPOMA EXCISION  2015   x20 [facility]   MITRAL VALVE REPAIR Right 05/09/2020   Procedure: MINIMALLY INVASIVE MITRAL VALVE REPAIR (MVR) USING LIVANOVA MEMO 4D MITRAL RING;  Surgeon: Purcell Nails, MD;  Location: [facility] OR;  Service: Open Heart Surgery;  Laterality: Right;   POLYPECTOMY  01/03/2020   Procedure: POLYPECTOMY;  Surgeon: Charlott Rakes, MD;  Location: [facility] ENDOSCOPY;  Service: Endoscopy;;   PORT-A-CATH REMOVAL     PORTACATH PLACEMENT N/A 03/16/2019   Procedure: INSERTION PORT-A-CATH;  Surgeon: Karie Soda, MD;  Location: [facility] ORS;  Service: General;  Laterality: N/A;   RIGHT/LEFT HEART CATH AND CORONARY ANGIOGRAPHY N/A 02/10/2020   Procedure: RIGHT/LEFT HEART CATH AND CORONARY ANGIOGRAPHY;  Surgeon: Corky Crafts, MD;  Location: [facility] INVASIVE CV LAB;  Service: Cardiovascular;  Laterality: N/A;   SUBMUCOSAL INJECTION  01/07/2019   Procedure: SUBMUCOSAL INJECTION;  Surgeon: Charlott Rakes, MD;  Location: [facility] ENDOSCOPY;  Service: Endoscopy;;   TEE WITHOUT CARDIOVERSION N/A 02/10/2020   Procedure: TRANSESOPHAGEAL ECHOCARDIOGRAM (TEE);  Surgeon: Elease Hashimoto Deloris Ping, MD;  Location: [facility] ENDOSCOPY;  Service: Cardiovascular;  Laterality: N/A;  CATH AFTER TEE   TEE WITHOUT CARDIOVERSION N/A 05/09/2020   Procedure:  TRANSESOPHAGEAL ECHOCARDIOGRAM (TEE);  Surgeon: Purcell Nails, MD;  Location: [facility] OR;  Service: Open Heart Surgery;  Laterality: N/A;   THYROIDECTOMY, PARTIAL  01/05/2009   Right Thyroid Lobectomy   TONSILLECTOMY     TOTAL NEPHRECTOMY Left 1970   Wilms Tumor left kidney excised as an infant     ALLERGIES: Allergies  Allergen Reactions   Cat Dander Anaphylaxis   Empagliflozin Other (See Comments)    Recurrent UTI    FAMILY HISTORY: Family History  Problem Relation Age of Onset   Heart disease Father    Hernia Father    Healthy Sister    Stroke Paternal Grandmother    Cancer Mother        THYROID   Hypotension Mother    Heart attack Maternal Grandfather    Congestive Heart Failure Maternal Grandfather    Heart attack Paternal Grandfather    Colon cancer Maternal Aunt 79   Congestive Heart Failure Paternal Uncle    Congestive Heart Failure Maternal Grandmother    Colon cancer Other 4       MGMs brother   Colon cancer Other        MGFs brother    SOCIAL HISTORY: Social History   Tobacco Use   Smoking status: Never   Smokeless tobacco: Never  Vaping Use   Vaping status: Never Used  Substance Use Topics   Alcohol use: Not Currently    Alcohol/week: 1.0 standard drink of alcohol    Types: 1 Glasses of wine per week    Comment: occasional   Drug use: No   Social History   Social History Narrative   Never smoked Alcohol yes, rare ,1-2 per [employer] 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 Right   Are you currently employed ?    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   Who lives with you? Wife and son   What type of home do you live in: 1 story or 2 story? two   Caffiene occ.      OBJECTIVE: PHYSICAL EXAM: BP 102/64   Pulse (!) 108   Ht 5\' 11"  (1.803 m)   Wt 180 lb (81.6 kg)   SpO2 99%   BMI 25.10 kg/m   General: General appearance: Awake and alert. Pale. No distress. Cooperative with exam.  Skin: No obvious rash or jaundice. HEENT: Atraumatic. Anicteric. Lungs: Dyspnea on mild exertion Extremities: No edema. No obvious deformity.  Psych: Affect  appropriate.  Neurological: Mental Status: Alert. Speech fluent. No pseudobulbar affect Cranial Nerves: CNII: No RAPD. Visual fields grossly intact. CNIII, IV, VI: PERRL. No nystagmus. EOMI. CN V: Facial sensation intact bilaterally to fine touch. CN VII: Facial muscles symmetric and strong. No ptosis at rest. CN VIII: Hearing grossly intact bilaterally. CN IX: No hypophonia. CN X: Palate elevates symmetrically. CN XI: Full strength shoulder shrug bilaterally. CN XII: Tongue protrusion full and midline. No atrophy or fasciculations. No significant dysarthria Motor: Tone is normal. No significant atrophy.  Individual muscle group testing (MRC grade out of 5):  Movement     Neck flexion 5    Neck extension 5     Right Left   Shoulder abduction 5 5   Elbow flexion 5 5   Elbow extension 5 5   Finger abduction - FDI 5 5   Finger  abduction - ADM 5 5   Finger extension 5 5   Finger distal flexion - 2/3 5 5    Finger distal flexion - 4/5 5 5    Thumb flexion - FPL 5 5   Thumb abduction - APB 5 5    Hip flexion 4 4-   Hip extension 5 5   Hip adduction 5 5   Hip abduction 5 5   Knee extension 5 5-   Knee flexion 5 5-   Dorsiflexion 5 5   Plantarflexion 5 5   Inversion 5 5   Eversion 5 5     Reflexes:  Right Left   Bicep 2+ 2+   Tricep 2+ 2+   BrRad 2+ 2+   Knee 2+ 2+   Ankle 0 0    Pathological Reflexes: Babinski: mute response bilaterally Hoffman: absent bilaterally Troemner: absent bilaterally Sensation: Pinprick: Diminished in bilateral lower extremities to the ankle, otherwise intact Vibration: Diminished in lower extremities to knees (L > R) Coordination: Intact finger-to- nose-finger bilaterally. Mild intention tremor. Romberg negative. Gait: Unable to rise from chair with arms crossed unassisted. Wide based, unsteady gait.  Lab and Test Review: Internal labs: 06/06/23: CMP significant for Na 129, glucose 127, albumin 1.7 CBC significant for Hb 7.5, MCV  100.9, plts 101  06/03/23: Ferritin 2855 CRP 15/4 CEA elevated to 35/31  CSF (05/27/23): Cytology - no malignant cells seen 8 R, 0 W, 64 P, 141 G Oligoclonal bands negative Meningitis/encephalitis panel negative (crypto, CMV, enterovirus, E Coli, H flu, HSV1, 2, 6, listeria, neisseria, strep agalactiae, strep pneumo, VZV) [facility] myelopathy panel: GAD65 mildly elevated to 0.08 but otherwise negative   05/26/23: B12: 1939 Hep panel non-reactive HIV non-reactive TSH wnl  05/25/23: CK: 188 Aldolase 7.8 HbA1c: 9.5   Imaging/Procedures: MRI thoracic and lumbar spine w/wo contrast (05/24/23): FINDINGS: MRI THORACIC SPINE FINDINGS   Alignment:  Levoscoliosis.  No static subluxation.   Vertebrae: There is diffuse osseous metastatic disease affecting all of the thoracic levels. There is no acute compression fracture.   Cord:  Normal signal and morphology.   Paraspinal and other soft tissues: Limited visualization due to motion.   Disc levels:   There is no spinal canal stenosis. No abnormal epidural soft tissue.   MRI LUMBAR SPINE FINDINGS   Segmentation:  Standard.   Alignment: S shaped scoliosis with right apex at L1 and left apex at L5.   Vertebrae: Diffuse osseous metastatic disease throughout the lumbar spine and sacrum. There is mild multilevel height loss but no acute fracture.   Conus medullaris: Extends to the L1 level and appears normal.   Paraspinal and other soft tissues: Incompletely visualized left abdominal mass measuring at least 11.6 cm.   Disc levels:   L1-L2: Normal disc space and facet joints. No spinal canal stenosis. No neural foraminal stenosis.   L2-L3: Small disc bulge. No spinal canal stenosis. No neural foraminal stenosis.   L3-L4: Small disc bulge. No spinal canal stenosis. Mild bilateral neural foraminal stenosis.   L4-L5: Small right asymmetric disc bulge and mild facet arthrosis. No spinal canal stenosis. Worsened severe right and  moderate left neural foraminal stenosis.   L5-S1: Normal disc space and facet joints. No spinal canal stenosis. No neural foraminal stenosis.   Visualized sacrum: Normal.   IMPRESSION: 1. Diffuse osseous metastatic disease throughout the thoracic and lumbar spine and sacrum. No acute fracture or spinal canal stenosis. 2. Incompletely visualized left abdominal mass measuring at least 11.6 cm. 3. Worsened  severe right and moderate left L4-L5 neural foraminal stenosis due to combination of disc bulge and facet arthrosis.  MRI brain w/wo contrast (05/25/23): FINDINGS: Brain: Cerebral volume within normal limits. Patchy T2/FLAIR hyperintensity involving the supratentorial cerebral white matter, most consistent with chronic small vessel ischemic disease, mild in nature. No evidence for acute or subacute infarct. No acute intracranial hemorrhage. Few scattered chronic micro hemorrhages noted, most prominent of which present at the left lentiform nucleus.   Punctate focus of enhancement noted at the left parieto-occipital junction (series 16, image 101). Additional punctate focus of enhancement noted within the left parietal lobe superiorly (series 16, image 116). Findings are nonspecific. No other mass lesion or abnormal enhancement. No other evidence for intra-axial metastatic disease. No mass effect or midline shift. No hydrocephalus or extra-axial fluid collection. Pituitary gland suprasellar region within normal limits.   Vascular: Major intracranial vascular flow voids are maintained.   Skull and upper cervical spine: Craniocervical junction within normal limits. Heterogeneous signal intensity within the visualized bone marrow with a few probable marrow replacing lesions about the skull base and calvarium, consistent with osseous metastatic disease. No visible extra osseous extension of tumor. No scalp soft tissue abnormality.   Sinuses/Orbits: Globes and orbital soft tissues  within normal limits. Paranasal sinuses are largely clear. Trace left mastoid effusion noted, of doubtful significance.   Other: None.   IMPRESSION: 1. Two punctate foci of enhancement involving the left parieto-occipital region as above. While these findings are nonspecific, tiny metastatic lesions are suspected given patient history. A short interval follow-up MRI could be performed for further evaluation and to evaluate for interval change as warranted. 2. Heterogeneous bone marrow signal intensity with a few marrow replacing lesions about the skull base and calvarium, consistent with osseous metastatic disease. 3. No other acute intracranial abnormality. 4. Punctate underlying mild chronic microvascular ischemic disease.  MRI cervical spine w/wo contrast (05/25/23): FINDINGS: Alignment: Straightening with mild reversal of the normal cervical lordosis. No significant listhesis.   Vertebrae: Diffusely abnormal and heterogeneous appearance of the visualized bone marrow, consistent with diffuse osseous metastatic disease. There is involvement of essentially all levels including the skull base. Small amount of extraosseous/epidural extension noted within the right ventral epidural space at T1 (series 6, image 7). No other significant extra osseous or epidural extension of tumor. No pathologic compression fracture.   Cord: Normal signal and morphology.   Posterior Fossa, vertebral arteries, paraspinal tissues: Unremarkable.   Disc levels:   C2-C3: Negative interspace. Mild left-sided facet hypertrophy. No spinal stenosis. Foramina remain patent.   C3-C4: Small left paracentral disc protrusion mildly flattens and indents the left ventral thecal sac. Superimposed bilateral uncovertebral spurring with mild left-sided facet hypertrophy. No significant spinal stenosis. Mild left C4 foraminal narrowing. Right neural foramen remains patent.   C4-C5: Degenerative intervertebral  disc space narrowing with diffuse disc bulge and bilateral uncovertebral spurring. No spinal stenosis. Mild left C5 foraminal stenosis. Right neural foramina remains patent.   C5-C6: Mild disc bulge with bilateral uncovertebral spurring. Mild left-sided facet spurring. No significant spinal stenosis. Mild left C6 foraminal narrowing. Right neural foramina remains patent.   C6-C7: Degenerative disc space narrowing with diffuse disc bulge and bilateral uncovertebral spurring, slightly asymmetric to the left. No significant spinal stenosis. Foramina remain patent.   C7-T1: Degenerate intervertebral disc space narrowing with diffuse disc bulge and bilateral uncovertebral spurring. No significant spinal stenosis. Foramina remain patent.   IMPRESSION: 1. Diffuse osseous metastatic disease involving the cervical spine. Small amount  of extraosseous/epidural extension of tumor within the right ventral epidural space at T1 without stenosis. No other significant extra osseous or epidural extension of tumor. No pathologic compression fracture. 2. Underlying mild multilevel cervical spondylosis without significant spinal stenosis. Mild left C4 through C6 foraminal narrowing.  ASSESSMENT: [name] is a 58 y.o. male who presents for evaluation of weakness and poor energy/fatigue. 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 His neurological examination is pertinent for proximal weakness of lower extremities and distal symmetric sensory loss. MRI of neuroaxis showed diffuse metastatic disease involving the spine. HbA1c was elevated to 9.5, but other lab work, including CSF and CK were unremarkable. Patient's symptoms are likely multifactorial with contributions from known spine disease prior to cancer, metastatic cancer to spine, neuropathy (chemo and  DM as risk factors), and deconditioning. While steroid myopathy is possible, I do not see clear evidence of a generalized myopathy on examination today. The thigh flexion weakness could be due to deconditioning. I will work up and treat as below.  PLAN: -Blood work: vit D, B1 -Physical therapy for weakness -Discussed EMG, but given the potential low yield, patient deferred (steroid myopathy does not typically show up on EMG) -If PT does not help, could consider NSGY or pain management referral to discuss options with spine. I will reach out to [name] about this possibility and get thoughts from oncology.  -Return to clinic in 3 months  The impression above as well as the plan as outlined below were extensively discussed with the patient (in the company of wife and sister) who voiced understanding. All questions were answered to their satisfaction.  The patient was counseled on pertinent fall precautions per the printed material provided today, and as noted under the "Patient Instructions" section below.  When available, results of the above investigations and possible further recommendations will be communicated to the patient via telephone/MyChart. Patient to call office if not contacted after expected testing turnaround time.   Total time spent reviewing records, interview, history/exam, documentation, and coordination of care on day of encounter:  75 min   Thank you for allowing me to participate in patient's care.  If I can answer any additional questions, I would be pleased to do so.  Jacquelyne Balint, MD   CC: Joycelyn Rua, MD [address] [place] [address]  CC: Referring provider: Rejeana Brock, MD [address] [place],  [place] [address]

## 2023-06-08 NOTE — Telephone Encounter (Signed)
 Called to report 2 day history of pitting edema in feet up to above ankles. No redness or pain. Is taking his Pradaxa and ASA. [name] made aware-could be to all the IV fluids he has received. Would like to see him tomorrow or Wednesday before his treatment on 3/13. He agrees to 3/12 2:15 pm.

## 2023-06-09 ENCOUNTER — Other Ambulatory Visit: Payer: Self-pay | Admitting: Oncology

## 2023-06-09 DIAGNOSIS — C186 Malignant neoplasm of descending colon: Secondary | ICD-10-CM

## 2023-06-10 ENCOUNTER — Other Ambulatory Visit (HOSPITAL_BASED_OUTPATIENT_CLINIC_OR_DEPARTMENT_OTHER): Payer: Self-pay

## 2023-06-10 ENCOUNTER — Encounter: Payer: Self-pay | Admitting: Nurse Practitioner

## 2023-06-10 ENCOUNTER — Inpatient Hospital Stay

## 2023-06-10 ENCOUNTER — Inpatient Hospital Stay: Admitting: Nurse Practitioner

## 2023-06-10 VITALS — BP 130/82 | HR 108 | Temp 98.1°F | Resp 18 | Ht 71.0 in | Wt 187.3 lb

## 2023-06-10 DIAGNOSIS — C186 Malignant neoplasm of descending colon: Secondary | ICD-10-CM

## 2023-06-10 DIAGNOSIS — Z95828 Presence of other vascular implants and grafts: Secondary | ICD-10-CM

## 2023-06-10 DIAGNOSIS — C184 Malignant neoplasm of transverse colon: Secondary | ICD-10-CM | POA: Diagnosis not present

## 2023-06-10 DIAGNOSIS — Z5111 Encounter for antineoplastic chemotherapy: Secondary | ICD-10-CM | POA: Diagnosis not present

## 2023-06-10 DIAGNOSIS — C61 Malignant neoplasm of prostate: Secondary | ICD-10-CM | POA: Diagnosis not present

## 2023-06-10 DIAGNOSIS — C189 Malignant neoplasm of colon, unspecified: Secondary | ICD-10-CM

## 2023-06-10 DIAGNOSIS — R531 Weakness: Secondary | ICD-10-CM | POA: Diagnosis not present

## 2023-06-10 DIAGNOSIS — C787 Secondary malignant neoplasm of liver and intrahepatic bile duct: Secondary | ICD-10-CM | POA: Diagnosis not present

## 2023-06-10 DIAGNOSIS — Z5112 Encounter for antineoplastic immunotherapy: Secondary | ICD-10-CM | POA: Diagnosis not present

## 2023-06-10 DIAGNOSIS — C7951 Secondary malignant neoplasm of bone: Secondary | ICD-10-CM

## 2023-06-10 DIAGNOSIS — C7801 Secondary malignant neoplasm of right lung: Secondary | ICD-10-CM | POA: Diagnosis not present

## 2023-06-10 DIAGNOSIS — D6959 Other secondary thrombocytopenia: Secondary | ICD-10-CM | POA: Diagnosis not present

## 2023-06-10 DIAGNOSIS — L0231 Cutaneous abscess of buttock: Secondary | ICD-10-CM | POA: Diagnosis not present

## 2023-06-10 LAB — CBC WITH DIFFERENTIAL (CANCER CENTER ONLY)
Abs Immature Granulocytes: 0.54 10*3/uL — ABNORMAL HIGH (ref 0.00–0.07)
Basophils Absolute: 0 10*3/uL (ref 0.0–0.1)
Basophils Relative: 1 %
Eosinophils Absolute: 0.2 10*3/uL (ref 0.0–0.5)
Eosinophils Relative: 3 %
HCT: 22.7 % — ABNORMAL LOW (ref 39.0–52.0)
Hemoglobin: 7.4 g/dL — ABNORMAL LOW (ref 13.0–17.0)
Immature Granulocytes: 8 %
Lymphocytes Relative: 7 %
Lymphs Abs: 0.5 10*3/uL — ABNORMAL LOW (ref 0.7–4.0)
MCH: 32.3 pg (ref 26.0–34.0)
MCHC: 32.6 g/dL (ref 30.0–36.0)
MCV: 99.1 fL (ref 80.0–100.0)
Monocytes Absolute: 0.6 10*3/uL (ref 0.1–1.0)
Monocytes Relative: 10 %
Neutro Abs: 4.6 10*3/uL (ref 1.7–7.7)
Neutrophils Relative %: 71 %
Platelet Count: 112 10*3/uL — ABNORMAL LOW (ref 150–400)
RBC: 2.29 MIL/uL — ABNORMAL LOW (ref 4.22–5.81)
RDW: 19.1 % — ABNORMAL HIGH (ref 11.5–15.5)
WBC Count: 6.5 10*3/uL (ref 4.0–10.5)
nRBC: 2.6 % — ABNORMAL HIGH (ref 0.0–0.2)

## 2023-06-10 LAB — CMP (CANCER CENTER ONLY)
ALT: 12 U/L (ref 0–44)
AST: 22 U/L (ref 15–41)
Albumin: 2.6 g/dL — ABNORMAL LOW (ref 3.5–5.0)
Alkaline Phosphatase: 463 U/L — ABNORMAL HIGH (ref 38–126)
Anion gap: 7 (ref 5–15)
BUN: 18 mg/dL (ref 6–20)
CO2: 27 mmol/L (ref 22–32)
Calcium: 7.5 mg/dL — ABNORMAL LOW (ref 8.9–10.3)
Chloride: 98 mmol/L (ref 98–111)
Creatinine: 0.53 mg/dL — ABNORMAL LOW (ref 0.61–1.24)
GFR, Estimated: >60 mL/min (ref >60)
Glucose, Bld: 220 mg/dL — ABNORMAL HIGH (ref 70–99)
Potassium: 4.5 mmol/L (ref 3.5–5.1)
Sodium: 132 mmol/L — ABNORMAL LOW (ref 135–145)
Total Bilirubin: 0.4 mg/dL (ref 0.0–1.2)
Total Protein: 5.2 g/dL — ABNORMAL LOW (ref 6.5–8.1)

## 2023-06-10 LAB — CEA (ACCESS): CEA (CHCC): 21.44 ng/mL — ABNORMAL HIGH (ref 0.00–5.00)

## 2023-06-10 LAB — MISC LABCORP TEST (SEND OUT)

## 2023-06-10 LAB — TOTAL PROTEIN, URINE DIPSTICK: Protein, ur: 30 mg/dL — AB

## 2023-06-10 MED ORDER — PROCHLORPERAZINE MALEATE 10 MG PO TABS
10.0000 mg | ORAL_TABLET | Freq: Four times a day (QID) | ORAL | 3 refills | Status: DC | PRN
  Filled 2023-06-10: qty 30, 8d supply, fill #0

## 2023-06-10 MED ORDER — HEPARIN SOD (PORK) LOCK FLUSH 100 UNIT/ML IV SOLN
500.0000 [IU] | Freq: Once | INTRAVENOUS | Status: AC
Start: 2023-06-10 — End: 2023-06-10
  Administered 2023-06-10: 500 [IU] via INTRAVENOUS

## 2023-06-10 MED ORDER — MORPHINE SULFATE ER 30 MG PO TBCR
30.0000 mg | EXTENDED_RELEASE_TABLET | Freq: Two times a day (BID) | ORAL | 0 refills | Status: DC
  Filled 2023-06-10: qty 60, 30d supply, fill #0

## 2023-06-10 MED ORDER — SODIUM CHLORIDE 0.9% FLUSH
10.0000 mL | INTRAVENOUS | Status: DC | PRN
Start: 2023-06-10 — End: 2023-06-10
  Administered 2023-06-10: 10 mL via INTRAVENOUS

## 2023-06-10 MED ORDER — HYDROMORPHONE HCL 4 MG PO TABS
4.0000 mg | ORAL_TABLET | ORAL | 0 refills | Status: DC | PRN
  Filled 2023-06-10: qty 60, 10d supply, fill #0

## 2023-06-10 NOTE — Progress Notes (Signed)
 [facility] OFFICE PROGRESS NOTE   Diagnosis: Colon cancer  INTERVAL HISTORY:   Mr. Rentz returns for first follow-up since hospital discharge 06/06/2023.  He is scheduled to begin FOLFOX chemotherapy tomorrow.  Skin lesions are no longer draining.  He denies fever.  He continues Augmentin.  He reports good fluid intake.  Overall appetite is poor.  He continues to have pain in multiple areas.  He is taking MS Contin every 12 hours and hydrocodone as needed.  He needs refills on both.  He has noted swelling at both lower legs.  Objective:  Vital signs in last 24 hours:  Blood pressure 130/82, pulse (!) 108, temperature 98.1 F (36.7 C), temperature source Oral, resp. rate 18, height 5\' 11"  (1.803 m), weight 187 lb 4.8 oz (85 kg), SpO2 100%.    HEENT: No thrush or ulcers.  Mucous membranes appear moist. Resp: Lungs clear bilaterally. Cardio: Regular rate and rhythm. GI: Abdomen is soft.  Tender at the left upper quadrant.  No hepatosplenomegaly. Vascular: Pitting edema lower leg bilaterally. Skin: Healing cyst at the left suprapubic region is without erythema and drainage.  Persistent induration at the upper gluteal fold, tender over the area of induration.  No drainage.  Mild erythema extending to the right buttock.   Port-A-Cath without erythema.  Lab Results:  Lab Results  Component Value Date   WBC 6.2 06/06/2023   HGB 7.5 (L) 06/06/2023   HCT 23.3 (L) 06/06/2023   MCV 100.9 (H) 06/06/2023   PLT 101 (L) 06/06/2023   NEUTROABS 5.2 06/04/2023    Imaging:  No results found.  Medications: I have reviewed the patient's current medications.  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, status post a left colectomy 03/04/2019 Mass felt to be in the transverse colon on a colonoscopy 01/07/2019 with a biopsy confirming poorly differentiated adenocarcinoma  with signet ring cell features, mismatch repair protein expression normal CT abdomen/pelvis 01/08/2019 12.3 cm indeterminate splenic mass, present in 2015 on a lumbar MRI-felt to be benign, small subpleural and pleural-based nodules at the lung bases, status post left nephrectomy CT chest 01/20/2019-small bilateral pulmonary nodules with rounded parenchymal nodules in the right lower lobe, indeterminate splenic mass CT chest 03/18/2019-multiple subcentimeter pulmonary nodules again seen bilaterally, greatest in the lower lobes, without significant change.  No new or enlarging pulmonary nodules or masses.  Partially visualized large heterogeneous splenic mass with no significant change.  Plan for follow-up chest CT at a 8-month interval. Cycle 1 FOLFOX 03/21/2019 Cycle 2 FOLFOX 04/04/2019 Cycle 3 FOLFOX 04/18/2019 (oxaliplatin dose reduced secondary to thrombocytopenia) Cycle 4 FOLFOX 05/02/2019 (oxaliplatin held secondary to thrombocytopenia) Cycle 5 FOLFOX 05/16/2019 Cycle 6 FOLFOX 05/30/2019 (oxaliplatin held secondary to thrombocytopenia) CT chest 06/08/2019-stable scattered small solid pulmonary nodules  Cycle 7 FOLFOX 06/13/2019 Cycle 8 FOLFOX 06/27/2019 Cycle 9 FOLFOX 07/11/2019 Cycle 10 FOLFOX 07/25/2019 (oxaliplatin held due to thrombocytopenia) Cycle 11 FOLFOX 08/08/2019 Cycle 12 FOLFOX 08/22/2019 CTs 01/09/2020-no evidence of recurrent disease, stable lung nodules favored to represent subpleural lymph nodes-dedicated follow-up not recommended CTs 01/11/2021-no evidence of recurrent disease CTs 01/24/2022-no evidence of recurrent disease CT lumbar spine, abdomen/pelvis 04/11/2022-numerous mixed lytic and sclerotic lesions scattered throughout the visualized axial and appendicular skeleton including several areas in the lumbar spine.   04/21/2022 CT biopsy sclerotic lesion right anterior iliac bone-consistent with metastatic colorectal adenocarcinoma, moderate to poorly differentiated.  Foundation  1-microsatellite stable, tumor mutation burden 4, K-ras G12S  PET scan 05/09/2022-widespread hypermetabolic mixed faintly lytic and sclerotic osseous metastases throughout the axial and proximal appendicular skeleton; associated mild pathologic compression fracture at L3; no hypermetabolic extraosseous metastatic disease. Cycle 1 FOLFIRI/bevacizumab 05/26/2022 Cycle 2 FOLFIRI/bevacizumab 06/09/2022 Cycle 3 FOLFIRI 06/24/2022, bevacizumab 06/26/2022 Cycle 4 FOLFIRI 07/08/2022, bevacizumab held due to proteinuria Cycle 5 FOLFIRI 07/22/2022, bevacizumab held due to proteinuria CTs 07/31/2022-potentially areas of sclerosis represent interval healing; new lytic lesion at T12, multiple new areas of punched-out sclerosis throughout the visualized osseous structures.  Index lesion in the left iliac wing measures 1.3 x 2.8 cm.  Healing of previously metabolically occult metastatic disease on 05/09/2022 is a possibility. Cycle 6 FOLFIRI 08/04/2022, irinotecan dose reduced due to thrombocytopenia, bevacizumab held pending 24-hour urine result Cycle 7 FOLFIRI 08/18/2022, bevacizumab held secondary to proteinuria, G-CSF held Cycle 8 FOLFIRI 09/01/2022, bevacizumab held, G-CSF Cycle 9 FOLFIRI 09/16/2022, bevacizumab held, G-CSF CTs 09/29/2022-enlargement of a hypodense lesion posterior left lobe of the liver, similar diffuse mixed lytic and sclerotic osseous metastatic disease, multiple new small nodules medial right lung base, multiple other small bilateral pulmonary nodules unchanged. Cycle 10 FOLFIRI plus Avastin 10/08/2022 Cycle 11 FOLFIRI 10/27/2022, Avastin held Cycle 12 FOLFIRI/Avastin 11/11/2022 Cycle 13 FOLFIRI/Avastin 11/25/2022 CTs 12/08/2022-stable lesion posterior left lobe of the liver.  Unchanged diffuse lytic and sclerotic osseous metastatic disease.  Stable small solid pulmonary nodules.  Stable splenic lesions.  Slightly increased bladder wall thickening. Cycle 14 FOLFIRI/Avastin 12/10/2022 Cycle 15 FOLFIRI/Avastin  12/24/2022 12/31/2022 24-hour urine protein 710 mg Cycle 16 FOLFIRI/Avastin 01/07/2023 Cycle 17 FOLFIRI 01/21/2023, Avastin held secondary to persistent proteinuria, slight increase in creatinine, and drainage from the suprapubic lesion CTs 01/26/2023-unchanged widespread sclerotic osseous metastatic disease, unchanged hypodense liver lesions, occasional small bilateral lung nodules unchanged. Cycle 18 FOLFIRI 02/03/2023, Avastin held Cycle 19 FOLFIRI 02/17/2023, Avastin held Cycle 20 FOLFIRI 03/03/2023, Avastin held Cycle 21 FOLFIRI 03/16/2023, Avastin held CTs 04/04/2023: Right increased sclerosis of osseous metastatic disease with new sclerotic lesions of the sternum/manubrium and increase sclerotic metastases throughout the pelvis, unchanged hypodense liver lesions Cycle 22 FOLFIRI 04/07/2023, Avastin held Cycle 23 FOLFIRI/Avastin 04/22/2023 Cycle 24 FOLFIRI/Avastin 05/06/2023 CT abdomen/pelvis 06/04/2023-grossly stable diffuse osseous sclerotic densities.  Liver is unremarkable. 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  Left nephrectomy at age 78 12.  Port-A-Cath placement, Dr. Michaell Cowing, 03/16/2019 13.  Thrombocytopenia secondary to chemotherapy-oxaliplatin dose reduced beginning with cycle 3 FOLFOX, improved 14.  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  Right groin soft tissue infection May 2023-status postsurgical debridement 18.  Left leg DVT 12/13/2021-common femoral, femoral, popliteal, posterior tibial, and peroneal veins 19.  02/04/2022-MRI cervical spine-1.7 cm T1 lesion-indeterminate; bone scan 03/14/2022-focal intense activity at approximate T1 level felt to correspond to the lesion on MRI, additional foci of activity involving the lower thoracic and lumbar spine, right iliac bone and pubic symphysis.  CT lumbar spine, abdomen/pelvis 04/11/2022-numerous mixed lytic and sclerotic lesions scattered throughout the visualized axial and appendicular skeleton including several areas in the lumbar  spine.  04/21/2022 CT biopsy sclerotic lesion right anterior iliac bone-consistent with metastatic colorectal adenocarcinoma, moderate to poorly differentiated 20.  Inflamed sebaceous cyst 08/18/2022-doxycycline Surgical resection of area of "hidradenitis " 03/19/2023 21.  Admission 05/24/2023 with increased back pain and new onset leg weakness MRI thoracic and lumbar spine 05/24/2023: Fuhs osseous metastatic disease, no acute compression fracture, normal cord signal but no spinal canal stenosis, no abnormal epidural soft tissue MRI cervical spine 05/25/2023: Diffuse osseous metastatic disease, small amount of extraosseous/epidural tumor at the right ventral epidural space at T1 without stenosis, no pathologic compression fracture MRI brain 05/25/2023: 2 punctate foci of enhancement in the left parieto-occipital region-tiny metastases suspected, osseous metastatic disease Lumbar puncture 05/27/2023-cytology negative for malignant cells 22.  Admission 06/03/2023 with clinical evidence of a gluteal abscess and failure to thrive, CT 06/04/2023 without a drainable fluid collection    Disposition: Mr. Ayyad appears stable.  He is scheduled to begin FOLFOX plus Avastin tomorrow.  We again reviewed potential toxicities.  He agrees to proceed.  The  leg edema is likely due to hypoalbuminemia.  He will elevate the legs and wear compression stockings.  The previously noted areas of induration and drainage at the left suprapubic and upper gluteal fold regions have improved.  He will complete the course of Augmentin.  He will return for follow-up and cycle 2 FOLFOX/Avastin in 2 weeks.  Patient seen with [name].  Lonna Cobb ANP/GNP-BC   06/10/2023  2:50 PM This was a shared visit with Lonna Cobb.  Mr. Crooker was interviewed and examined.  He was admitted with evidence of a sacral/perineal abscess.  A CT did not reveal a drainable collection.  His clinical status has improved with antibiotics.  Drainage from the gluteal region has resolved.  The plan is to proceed with FOLFOX/bevacizumab tomorrow.  We again reviewed the potential for an allergic reaction to oxaliplatin.  He will return for an office visit and chemotherapy in 2 weeks.  We will transfuse packed red blood cells if the hemoglobin falls further.  I was present for greater than 50% of today's visit.  I performed medical decision making.  Mancel Bale, MD

## 2023-06-10 NOTE — Patient Instructions (Signed)

## 2023-06-11 ENCOUNTER — Inpatient Hospital Stay

## 2023-06-11 ENCOUNTER — Other Ambulatory Visit: Payer: Self-pay

## 2023-06-11 ENCOUNTER — Other Ambulatory Visit: Payer: Self-pay | Admitting: Nurse Practitioner

## 2023-06-11 VITALS — BP 136/88 | HR 97 | Temp 98.6°F | Resp 18

## 2023-06-11 DIAGNOSIS — C7951 Secondary malignant neoplasm of bone: Secondary | ICD-10-CM | POA: Diagnosis not present

## 2023-06-11 DIAGNOSIS — R531 Weakness: Secondary | ICD-10-CM | POA: Diagnosis not present

## 2023-06-11 DIAGNOSIS — C61 Malignant neoplasm of prostate: Secondary | ICD-10-CM | POA: Diagnosis not present

## 2023-06-11 DIAGNOSIS — C186 Malignant neoplasm of descending colon: Secondary | ICD-10-CM

## 2023-06-11 DIAGNOSIS — Z5111 Encounter for antineoplastic chemotherapy: Secondary | ICD-10-CM | POA: Diagnosis not present

## 2023-06-11 DIAGNOSIS — D6959 Other secondary thrombocytopenia: Secondary | ICD-10-CM | POA: Diagnosis not present

## 2023-06-11 DIAGNOSIS — C184 Malignant neoplasm of transverse colon: Secondary | ICD-10-CM | POA: Diagnosis not present

## 2023-06-11 DIAGNOSIS — Z5112 Encounter for antineoplastic immunotherapy: Secondary | ICD-10-CM | POA: Diagnosis not present

## 2023-06-11 DIAGNOSIS — C7801 Secondary malignant neoplasm of right lung: Secondary | ICD-10-CM | POA: Diagnosis not present

## 2023-06-11 DIAGNOSIS — C787 Secondary malignant neoplasm of liver and intrahepatic bile duct: Secondary | ICD-10-CM | POA: Diagnosis not present

## 2023-06-11 DIAGNOSIS — L0231 Cutaneous abscess of buttock: Secondary | ICD-10-CM | POA: Diagnosis not present

## 2023-06-11 DIAGNOSIS — C189 Malignant neoplasm of colon, unspecified: Secondary | ICD-10-CM

## 2023-06-11 MED ORDER — DIPHENHYDRAMINE HCL 25 MG PO CAPS
50.0000 mg | ORAL_CAPSULE | Freq: Once | ORAL | Status: AC
  Administered 2023-06-11: 50 mg via ORAL
  Filled 2023-06-11: qty 2

## 2023-06-11 MED ORDER — SODIUM CHLORIDE 0.9 % IV SOLN
2400.0000 mg/m2 | INTRAVENOUS | Status: DC
  Administered 2023-06-11: 5000 mg via INTRAVENOUS
  Filled 2023-06-11: qty 100

## 2023-06-11 MED ORDER — PALONOSETRON HCL INJECTION 0.25 MG/5ML
0.2500 mg | Freq: Once | INTRAVENOUS | Status: AC
  Administered 2023-06-11: 0.25 mg via INTRAVENOUS
  Filled 2023-06-11: qty 5

## 2023-06-11 MED ORDER — FAMOTIDINE IN NACL 20-0.9 MG/50ML-% IV SOLN
20.0000 mg | Freq: Once | INTRAVENOUS | Status: AC
  Administered 2023-06-11: 20 mg via INTRAVENOUS
  Filled 2023-06-11: qty 50

## 2023-06-11 MED ORDER — DEXAMETHASONE SODIUM PHOSPHATE 10 MG/ML IJ SOLN
10.0000 mg | Freq: Once | INTRAMUSCULAR | Status: AC
  Administered 2023-06-11: 10 mg via INTRAVENOUS
  Filled 2023-06-11: qty 1

## 2023-06-11 MED ORDER — DEXTROSE 5 % IV SOLN: INTRAVENOUS | Status: DC

## 2023-06-11 MED ORDER — FLUOROURACIL CHEMO INJECTION 2.5 GM/50ML
400.0000 mg/m2 | Freq: Once | INTRAVENOUS | Status: AC
  Administered 2023-06-11: 800 mg via INTRAVENOUS
  Filled 2023-06-11: qty 16

## 2023-06-11 MED ORDER — SODIUM CHLORIDE 0.9 % IV SOLN
5.0000 mg/kg | Freq: Once | INTRAVENOUS | Status: AC
  Administered 2023-06-11: 400 mg via INTRAVENOUS
  Filled 2023-06-11: qty 16

## 2023-06-11 MED ORDER — OXALIPLATIN CHEMO INJECTION 100 MG/20ML
65.0000 mg/m2 | Freq: Once | INTRAVENOUS | Status: AC
  Administered 2023-06-11: 135 mg via INTRAVENOUS
  Filled 2023-06-11: qty 20

## 2023-06-11 MED ORDER — DEXTROSE 5 % IV SOLN
400.0000 mg/m2 | Freq: Once | INTRAVENOUS | Status: AC
  Administered 2023-06-11: 816 mg via INTRAVENOUS
  Filled 2023-06-11: qty 40.8

## 2023-06-11 MED ORDER — SODIUM CHLORIDE 0.9 % IV SOLN: INTRAVENOUS | Status: DC

## 2023-06-11 NOTE — Patient Instructions (Signed)
 [facility]  Discharge Instructions: Thank you for choosing [facility] to provide your oncology and hematology care.   If you have a lab appointment with the Cancer Center, please go directly to the Cancer Center and check in at the registration area.   Wear comfortable clothing and clothing appropriate for easy access to any Portacath or PICC line.   We strive to give you quality time with your provider. You may need to reschedule your appointment if you arrive late (15 or more minutes).  Arriving late affects you and other patients whose appointments are after yours.  Also, if you miss three or more appointments without notifying the office, you may be dismissed from the clinic at the provider's discretion.      For prescription refill requests, have your pharmacy contact our office and allow 72 hours for refills to be completed.    Today you received the following chemotherapy and/or immunotherapy agents: Bevacizumab-awwb, Oxaliplatin, Leucovorin and Fluorouracil      To help prevent nausea and vomiting after your treatment, we encourage you to take your nausea medication as directed.  BELOW ARE SYMPTOMS THAT SHOULD BE REPORTED IMMEDIATELY: *FEVER GREATER THAN 100.4 F (38 C) OR HIGHER *CHILLS OR SWEATING *NAUSEA AND VOMITING THAT IS NOT CONTROLLED WITH YOUR NAUSEA MEDICATION *UNUSUAL SHORTNESS OF BREATH *UNUSUAL BRUISING OR BLEEDING *URINARY PROBLEMS (pain or burning when urinating, or frequent urination) *BOWEL PROBLEMS (unusual diarrhea, constipation, pain near the anus) TENDERNESS IN MOUTH AND THROAT WITH OR WITHOUT PRESENCE OF ULCERS (sore throat, sores in mouth, or a toothache) UNUSUAL RASH, SWELLING OR PAIN  UNUSUAL VAGINAL DISCHARGE OR ITCHING   Items with * indicate a potential emergency and should be followed up as soon as possible or go to the Emergency Department if any problems should occur.  Please  show the CHEMOTHERAPY ALERT CARD or IMMUNOTHERAPY ALERT CARD at check-in to the Emergency Department and triage nurse.  Should you have questions after your visit or need to cancel or reschedule your appointment, please contact [facility]  Dept: [phone]  and follow the prompts.  Office hours are 8:00 a.m. to 4:30 p.m. Monday - Friday. Please note that voicemails left after 4:00 p.m. may not be returned until the following business day.  We are closed weekends and major holidays. You have access to a nurse at all times for urgent questions. Please call the main number to the clinic Dept: [phone] and follow the prompts.   For any non-urgent questions, you may also contact your provider using MyChart. We now offer e-Visits for anyone 19 and older to request care online for non-urgent symptoms. For details visit [email or web address].   Also download the MyChart app! Go to the app store, search "MyChart", open the app, select [facility], and log in with your MyChart username and password.

## 2023-06-13 ENCOUNTER — Inpatient Hospital Stay: Attending: Radiation Oncology

## 2023-06-13 VITALS — BP 140/85 | HR 99 | Temp 97.7°F | Resp 16

## 2023-06-13 DIAGNOSIS — Z85038 Personal history of other malignant neoplasm of large intestine: Secondary | ICD-10-CM | POA: Diagnosis not present

## 2023-06-13 DIAGNOSIS — C7951 Secondary malignant neoplasm of bone: Secondary | ICD-10-CM | POA: Insufficient documentation

## 2023-06-13 DIAGNOSIS — C186 Malignant neoplasm of descending colon: Secondary | ICD-10-CM

## 2023-06-13 DIAGNOSIS — Z452 Encounter for adjustment and management of vascular access device: Secondary | ICD-10-CM | POA: Insufficient documentation

## 2023-06-13 MED ORDER — SODIUM CHLORIDE 0.9% FLUSH
10.0000 mL | INTRAVENOUS | Status: DC | PRN
  Administered 2023-06-13: 10 mL

## 2023-06-13 MED ORDER — HEPARIN SOD (PORK) LOCK FLUSH 100 UNIT/ML IV SOLN
500.0000 [IU] | Freq: Once | INTRAVENOUS | Status: AC | PRN
  Administered 2023-06-13: 500 [IU]

## 2023-06-15 ENCOUNTER — Encounter: Payer: Self-pay | Admitting: Oncology

## 2023-06-16 ENCOUNTER — Ambulatory Visit
Admission: RE | Admit: 2023-06-16 | Discharge: 2023-06-16 | Disposition: A | Discharge status: Home / Self Care | Payer: BC Managed Care – PPO | Source: Ambulatory Visit | Attending: Nurse Practitioner | Admitting: Nurse Practitioner

## 2023-06-16 ENCOUNTER — Telehealth: Payer: Self-pay

## 2023-06-16 NOTE — Telephone Encounter (Signed)
 Notified the pt to ask his  of last day of worked, and also how he would like his copy of his disabilty forms sent to him. Pt request them to be emailed.

## 2023-06-16 NOTE — Progress Notes (Signed)
  Radiation Oncology         [phone]) [phone] ________________________________  Name: [name] MRN: [id number]  Date of Service: 06/16/2023  DOB: 07-29-1965  Post Treatment Telephone Note  Diagnosis:  C79.51 Secondary malignant neoplasm of bone (as documented in provider EOT note)  The patient was available for call today.  The patient did not note fatigue during radiation. The patient did not note skin changes in the field of radiation during therapy. The patient has noticed improvement in pain in the area(s) treated with radiation. The patient is not taking dexamethasone. The patient does not have symptoms of  weakness or loss of control of the extremities. The patient does not have symptoms of headache. The patient does not have symptoms of seizure or uncontrolled movement. The patient does not have symptoms of changes in vision. The patient does not have changes in speech. The patient does not have confusion.   The patient is scheduled for ongoing care with [name] in medical oncology. The patient was encouraged to call if he develops concerns or questions regarding radiation.   This concludes the interaction.  Ruel Favors, LPN

## 2023-06-17 ENCOUNTER — Other Ambulatory Visit

## 2023-06-17 ENCOUNTER — Encounter: Payer: Self-pay | Admitting: Neurology

## 2023-06-17 ENCOUNTER — Ambulatory Visit: Admitting: Neurology

## 2023-06-17 VITALS — BP 102/64 | HR 108 | Ht 71.0 in | Wt 180.0 lb

## 2023-06-17 DIAGNOSIS — C189 Malignant neoplasm of colon, unspecified: Secondary | ICD-10-CM

## 2023-06-17 DIAGNOSIS — R530 Neoplastic (malignant) related fatigue: Secondary | ICD-10-CM | POA: Diagnosis not present

## 2023-06-17 DIAGNOSIS — M5416 Radiculopathy, lumbar region: Secondary | ICD-10-CM

## 2023-06-17 DIAGNOSIS — R29898 Other symptoms and signs involving the musculoskeletal system: Secondary | ICD-10-CM

## 2023-06-17 DIAGNOSIS — C7951 Secondary malignant neoplasm of bone: Secondary | ICD-10-CM | POA: Diagnosis not present

## 2023-06-17 NOTE — Patient Instructions (Addendum)
 I saw you today for weakness and poor energy. 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 I would like to do the following to help: -Blood work today. I will be in touch when I have your results. -Physical therapy to help your weakness and conditioning  I will also reach out to [name] to discuss my thoughts and any other possible interventions that could help.  I will see you back in 3 months to check your progress. Please let me know if you have any questions or concerns in the meantime.   The physicians and staff at [facility] are committed to providing excellent care. You may receive a survey requesting feedback about your experience at our office. We strive to receive "very good" responses to the survey questions. If you feel that your experience would prevent you from giving the office a "very good " response, please contact our office to try to remedy the situation. We may be reached at [phone]. Thank you for taking the time out of your busy day to complete the survey.  Jacquelyne Balint, MD [facility]  Preventing Falls at [facility] are common, often dreaded events in the lives of older people. Aside from the obvious injuries and even death that may result, fall can cause wide-ranging consequences including loss of independence, mental decline, decreased activity and mobility. Younger people are also at risk of falling, especially those with chronic illnesses and fatigue.  Ways to reduce risk for falling Examine diet and medications. Warm foods and alcohol dilate blood vessels, which can lead to dizziness when standing. Sleep aids, antidepressants and pain medications can also increase the likelihood of a fall.  Get a vision exam. Poor vision, cataracts and glaucoma increase the chances of falling.  Check foot gear. Shoes should fit snugly and have a sturdy, nonskid sole and a broad, low  heel  Participate in a physician-approved exercise program to build and maintain muscle strength and improve balance and coordination. Programs that use ankle weights or stretch bands are excellent for muscle-strengthening. Water aerobics programs and low-impact Tai Chi programs have also been shown to improve balance and coordination.  Increase vitamin D intake. Vitamin D improves muscle strength and increases the amount of calcium the body is able to absorb and deposit in bones.  How to prevent falls from common hazards Floors - Remove all loose wires, cords, and throw rugs. Minimize clutter. Make sure rugs are anchored and smooth. Keep furniture in its usual place.  Chairs -- Use chairs with straight backs, armrests and firm seats. Add firm cushions to existing pieces to add height.  Bathroom - Install grab bars and non-skid tape in the tub or shower. Use a bathtub transfer bench or a shower chair with a back support Use an elevated toilet seat and/or safety rails to assist standing from a low surface. Do not use towel racks or bathroom tissue holders to help you stand.  Lighting - Make sure halls, stairways, and entrances are well-lit. Install a night light in your bathroom or hallway. Make sure there is a light switch at the top and bottom of the staircase. Turn lights on if you get up in the middle of the night. Make sure lamps or light switches are within reach of the bed if you have to get up during the night.  Kitchen - Install non-skid rubber mats near the sink and stove. Clean spills immediately.  Store frequently used utensils, pots, pans between waist and eye level. This helps prevent reaching and bending. Sit when getting things out of lower cupboards.  Living room/ Bedrooms - Place furniture with wide spaces in between, giving enough room to move around. Establish a route through the living room that gives you something to hold onto as you walk.  Stairs - Make sure treads, rails, and  rugs are secure. Install a rail on both sides of the stairs. If stairs are a threat, it might be helpful to arrange most of your activities on the lower level to reduce the number of times you must climb the stairs.  Entrances and doorways - Install metal handles on the walls adjacent to the doorknobs of all doors to make it more secure as you travel through the doorway.  Tips for maintaining balance Keep at least one hand free at all times. Try using a backpack or fanny pack to hold things rather than carrying them in your hands. Never carry objects in both hands when walking as this interferes with keeping your balance.  Attempt to swing both arms from front to back while walking. This might require a conscious effort if Parkinson's disease has diminished your movement. It will, however, help you to maintain balance and posture, and reduce fatigue.  Consciously lift your feet off of the ground when walking. Shuffling and dragging of the feet is a common culprit in losing your balance.  When trying to navigate turns, use a "U" technique of facing forward and making a wide turn, rather than pivoting sharply.  Try to stand with your feet shoulder-length apart. When your feet are close together for any length of time, you increase your risk of losing your balance and falling.  Do one thing at a time. Don't try to walk and accomplish another task, such as reading or looking around. The decrease in your automatic reflexes complicates motor function, so the less distraction, the better.  Do not wear rubber or gripping soled shoes, they might "catch" on the floor and cause tripping.  Move slowly when changing positions. Use deliberate, concentrated movements and, if needed, use a grab bar or walking aid. Count 15 seconds between each movement. For example, when rising from a seated position, wait 15 seconds after standing to begin walking.  If balance is a continuous problem, you might want to  consider a walking aid such as a cane, walking stick, or walker. Once you've mastered walking with help, you might be ready to try it on your own again.

## 2023-06-17 NOTE — Addendum Note (Signed)
 Addended by: Lenise Herald on: 06/17/2023 01:20 PM   Modules accepted: Orders

## 2023-06-18 ENCOUNTER — Telehealth: Payer: Self-pay

## 2023-06-18 ENCOUNTER — Other Ambulatory Visit: Payer: Self-pay

## 2023-06-18 NOTE — Telephone Encounter (Signed)
 Notified the pt of completed Disability forms.They were faxed and confirmation to confirmed. Also let the pt know his copied was mailed as requested.

## 2023-06-21 ENCOUNTER — Other Ambulatory Visit: Payer: Self-pay | Admitting: Oncology

## 2023-06-21 DIAGNOSIS — C186 Malignant neoplasm of descending colon: Secondary | ICD-10-CM

## 2023-06-22 LAB — VITAMIN D 25 HYDROXY (VIT D DEFICIENCY, FRACTURES): Vit D, 25-Hydroxy: 33 ng/mL (ref 30–100)

## 2023-06-22 LAB — MISC LABCORP TEST (SEND OUT): Labcorp test code: 9986

## 2023-06-22 LAB — VITAMIN B1: Vitamin B1 (Thiamine): 9 nmol/L (ref 8–30)

## 2023-06-23 ENCOUNTER — Encounter: Payer: Self-pay | Admitting: Neurology

## 2023-06-23 ENCOUNTER — Telehealth: Payer: Self-pay | Admitting: *Deleted

## 2023-06-23 ENCOUNTER — Other Ambulatory Visit: Payer: Self-pay | Admitting: *Deleted

## 2023-06-23 ENCOUNTER — Inpatient Hospital Stay

## 2023-06-23 VITALS — BP 120/73 | HR 105

## 2023-06-23 DIAGNOSIS — C7801 Secondary malignant neoplasm of right lung: Secondary | ICD-10-CM | POA: Diagnosis not present

## 2023-06-23 DIAGNOSIS — C186 Malignant neoplasm of descending colon: Secondary | ICD-10-CM

## 2023-06-23 DIAGNOSIS — Z5111 Encounter for antineoplastic chemotherapy: Secondary | ICD-10-CM | POA: Diagnosis not present

## 2023-06-23 DIAGNOSIS — C189 Malignant neoplasm of colon, unspecified: Secondary | ICD-10-CM

## 2023-06-23 DIAGNOSIS — Z5112 Encounter for antineoplastic immunotherapy: Secondary | ICD-10-CM | POA: Diagnosis not present

## 2023-06-23 DIAGNOSIS — D649 Anemia, unspecified: Secondary | ICD-10-CM

## 2023-06-23 DIAGNOSIS — C787 Secondary malignant neoplasm of liver and intrahepatic bile duct: Secondary | ICD-10-CM | POA: Diagnosis not present

## 2023-06-23 DIAGNOSIS — C7951 Secondary malignant neoplasm of bone: Secondary | ICD-10-CM | POA: Diagnosis not present

## 2023-06-23 DIAGNOSIS — C184 Malignant neoplasm of transverse colon: Secondary | ICD-10-CM | POA: Diagnosis not present

## 2023-06-23 DIAGNOSIS — C61 Malignant neoplasm of prostate: Secondary | ICD-10-CM | POA: Diagnosis not present

## 2023-06-23 DIAGNOSIS — D6959 Other secondary thrombocytopenia: Secondary | ICD-10-CM | POA: Diagnosis not present

## 2023-06-23 DIAGNOSIS — R531 Weakness: Secondary | ICD-10-CM | POA: Diagnosis not present

## 2023-06-23 DIAGNOSIS — L0231 Cutaneous abscess of buttock: Secondary | ICD-10-CM | POA: Diagnosis not present

## 2023-06-23 DIAGNOSIS — Z95828 Presence of other vascular implants and grafts: Secondary | ICD-10-CM

## 2023-06-23 LAB — CMP (CANCER CENTER ONLY)
ALT: 11 U/L (ref 0–44)
AST: 29 U/L (ref 15–41)
Albumin: 3.2 g/dL — ABNORMAL LOW (ref 3.5–5.0)
Alkaline Phosphatase: 483 U/L — ABNORMAL HIGH (ref 38–126)
Anion gap: 10 (ref 5–15)
BUN: 21 mg/dL — ABNORMAL HIGH (ref 6–20)
CO2: 23 mmol/L (ref 22–32)
Calcium: 7.9 mg/dL — ABNORMAL LOW (ref 8.9–10.3)
Chloride: 95 mmol/L — ABNORMAL LOW (ref 98–111)
Creatinine: 0.66 mg/dL (ref 0.61–1.24)
GFR, Estimated: >60 mL/min (ref >60)
Glucose, Bld: 203 mg/dL — ABNORMAL HIGH (ref 70–99)
Potassium: 5.1 mmol/L (ref 3.5–5.1)
Sodium: 128 mmol/L — ABNORMAL LOW (ref 135–145)
Total Bilirubin: 0.7 mg/dL (ref 0.0–1.2)
Total Protein: 5.8 g/dL — ABNORMAL LOW (ref 6.5–8.1)

## 2023-06-23 LAB — CBC WITH DIFFERENTIAL (CANCER CENTER ONLY)
Abs Immature Granulocytes: 0.16 10*3/uL — ABNORMAL HIGH (ref 0.00–0.07)
Basophils Absolute: 0 10*3/uL (ref 0.0–0.1)
Basophils Relative: 0 %
Eosinophils Absolute: 0.1 10*3/uL (ref 0.0–0.5)
Eosinophils Relative: 2 %
HCT: 20.1 % — ABNORMAL LOW (ref 39.0–52.0)
Hemoglobin: 6.2 g/dL — CL (ref 13.0–17.0)
Immature Granulocytes: 3 %
Lymphocytes Relative: 18 %
Lymphs Abs: 1.1 10*3/uL (ref 0.7–4.0)
MCH: 32.1 pg (ref 26.0–34.0)
MCHC: 30.8 g/dL (ref 30.0–36.0)
MCV: 104.1 fL — ABNORMAL HIGH (ref 80.0–100.0)
Monocytes Absolute: 0.7 10*3/uL (ref 0.1–1.0)
Monocytes Relative: 12 %
Neutro Abs: 3.9 10*3/uL (ref 1.7–7.7)
Neutrophils Relative %: 65 %
Platelet Count: 70 10*3/uL — ABNORMAL LOW (ref 150–400)
RBC: 1.93 MIL/uL — ABNORMAL LOW (ref 4.22–5.81)
RDW: 23.6 % — ABNORMAL HIGH (ref 11.5–15.5)
WBC Count: 6 10*3/uL (ref 4.0–10.5)
nRBC: 6.5 % — ABNORMAL HIGH (ref 0.0–0.2)

## 2023-06-23 LAB — CEA (ACCESS): CEA (CHCC): 37.68 ng/mL — ABNORMAL HIGH (ref 0.00–5.00)

## 2023-06-23 LAB — PREPARE RBC (CROSSMATCH)

## 2023-06-23 LAB — ABO/RH: ABO/RH(D): A POS

## 2023-06-23 LAB — SAMPLE TO BLOOD BANK

## 2023-06-23 MED ORDER — SODIUM CHLORIDE 0.9% FLUSH
10.0000 mL | INTRAVENOUS | Status: DC | PRN
  Administered 2023-06-23: 10 mL

## 2023-06-23 MED ORDER — HEPARIN SOD (PORK) LOCK FLUSH 100 UNIT/ML IV SOLN
500.0000 [IU] | Freq: Once | INTRAVENOUS | Status: AC | PRN
  Administered 2023-06-23: 500 [IU]

## 2023-06-23 NOTE — Telephone Encounter (Signed)
 Mrs. Acton called reporting patient has extreme weakness and fatigue with trouble walking from bed to bathroom without feeling exhausted and SOB w/exertion as well. BP has been lower than norm at ~ 110/3, and reports pulse is >100. Glucose this am was 91 (lower than usual), but back to 170 now.  Fluid intake is ~ 48 oz/day and he has no nausea. Asking if he would benefit from home infusions of fluids and vitamin B12? Suggested she wait to discuss all this at the appointment on 3/27, but we can have his 3/27 labs collected today to determine if he needs transfusion this week, since last Hgb was low. She agrees to this. Scheduler notified to call wife to set this up.

## 2023-06-23 NOTE — Progress Notes (Signed)
 Notified Mrs. Bogdan that Hgb is 6.2 and he has been set up for 2 units blood here tomorrow at 10 am. Reminded her to have him keep his blue arm band on. Blood orders placed and ticket faxed. Notified [facility] lab to send his blood bank samples stat today to [facility]. [facility] called and will p/u at 0900 and deliver here by 10:00 per TJ.

## 2023-06-24 ENCOUNTER — Inpatient Hospital Stay

## 2023-06-24 ENCOUNTER — Other Ambulatory Visit: Payer: Self-pay | Admitting: Nurse Practitioner

## 2023-06-24 VITALS — BP 125/78 | HR 95 | Temp 97.8°F | Resp 18

## 2023-06-24 DIAGNOSIS — L0231 Cutaneous abscess of buttock: Secondary | ICD-10-CM | POA: Diagnosis not present

## 2023-06-24 DIAGNOSIS — C184 Malignant neoplasm of transverse colon: Secondary | ICD-10-CM | POA: Diagnosis not present

## 2023-06-24 DIAGNOSIS — D649 Anemia, unspecified: Secondary | ICD-10-CM

## 2023-06-24 DIAGNOSIS — C61 Malignant neoplasm of prostate: Secondary | ICD-10-CM | POA: Diagnosis not present

## 2023-06-24 DIAGNOSIS — C7801 Secondary malignant neoplasm of right lung: Secondary | ICD-10-CM | POA: Diagnosis not present

## 2023-06-24 DIAGNOSIS — R531 Weakness: Secondary | ICD-10-CM | POA: Diagnosis not present

## 2023-06-24 DIAGNOSIS — C787 Secondary malignant neoplasm of liver and intrahepatic bile duct: Secondary | ICD-10-CM | POA: Diagnosis not present

## 2023-06-24 DIAGNOSIS — C189 Malignant neoplasm of colon, unspecified: Secondary | ICD-10-CM

## 2023-06-24 DIAGNOSIS — Z5112 Encounter for antineoplastic immunotherapy: Secondary | ICD-10-CM | POA: Diagnosis not present

## 2023-06-24 DIAGNOSIS — C7951 Secondary malignant neoplasm of bone: Secondary | ICD-10-CM | POA: Diagnosis not present

## 2023-06-24 DIAGNOSIS — D6959 Other secondary thrombocytopenia: Secondary | ICD-10-CM | POA: Diagnosis not present

## 2023-06-24 DIAGNOSIS — Z95828 Presence of other vascular implants and grafts: Secondary | ICD-10-CM

## 2023-06-24 DIAGNOSIS — Z5111 Encounter for antineoplastic chemotherapy: Secondary | ICD-10-CM | POA: Diagnosis not present

## 2023-06-24 MED ORDER — SODIUM CHLORIDE 0.9% FLUSH
10.0000 mL | INTRAVENOUS | Status: DC | PRN
  Administered 2023-06-24: 10 mL via INTRAVENOUS

## 2023-06-24 MED ORDER — HEPARIN SOD (PORK) LOCK FLUSH 100 UNIT/ML IV SOLN
500.0000 [IU] | Freq: Once | INTRAVENOUS | Status: AC
  Administered 2023-06-24: 500 [IU] via INTRAVENOUS

## 2023-06-24 MED ORDER — SODIUM CHLORIDE 0.9% IV SOLUTION
250.0000 mL | INTRAVENOUS | Status: DC
Start: 2023-06-24 — End: 2023-06-24
  Administered 2023-06-24: 250 mL via INTRAVENOUS

## 2023-06-24 NOTE — Progress Notes (Deleted)
 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  The Multi-Gene Panel offered by [facility] includes sequencing and/or deletion duplication testing of the following 87 genes: AIP, ALK, APC, ATM, AXIN2,BAP1,  BARD1, BLM, BMPR1A, BRCA1, BRCA2, BRIP1, CASR, CDC73, CDH1, CDK4, CDKN1B, CDKN1C, CDKN2A (p14ARF), CDKN2A (p16INK4a), CEBPA, CHEK2, CTNNA1, CTR9, DICER1, DIS3L2, EGFR (c.2369C>T, p.Thr790Met variant only), EPCAM (Deletion/duplication testing only), FH, FLCN, GATA2, GPC3, GREM1 (Promoter region deletion/duplication testing only), HOXB13 (c.251G>A, p.Gly84Glu), HRAS, KIT, MAX, MEN1, MET, MITF (c.952G>A, p.Glu318Lys variant only), MLH1, MSH2, MSH3, MSH6, MUTYH, NBN, NF1, NF2, NTHL1, PALB2, PDGFRA, PHOX2B, PMS2, POLD1, POLE, POT1, PRKAR1A, PTCH1, PTEN, RAD50, RAD51C, RAD51D, RB1, RECQL4, REST, RET, RNF43, RUNX1, SDHAF2, SDHA (sequence changes only), SDHB, SDHC, SDHD, SMAD4, SMARCA4, SMARCB1, SMARCE1, STK11, SUFU, TERC, TERT, TMEM127, TP53, TSC1, TSC2, VHL, WRN and WT1.  The report date is 02/08/2019.   03/10/2019 Cancer Staging   Staging form: Colon and Rectum, AJCC 8th Edition - Pathologic stage from 03/10/2019: pT4, pN2a, cM0 - Signed by Ladene Artist, MD on 03/10/2019   03/21/2019 - 08/24/2019 Chemotherapy   Patient is on Treatment Plan : COLORECTAL FOLFOX  q14d x 6 months     05/26/2022 - 05/08/2023 Chemotherapy   Patient is on Treatment Plan : COLORECTAL FOLFIRI + Bevacizumab q14d     06/11/2023 -  Chemotherapy   Patient is on Treatment Plan : COLORECTAL FOLFOX + Bevacizumab q14d        CURRENT THERAPY:   INTERVAL HISTORY   ROS   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   History of hiatal hernia    Hyperlipidemia    Hypertension    Diagnostic exercise tolerance test assessment:04/30/2010 : comments normal -no evidence os ischemia by ST analysis   lipoma    Lipoma 01/26/2019   Mitral regurgitation    Echo 12/2018: EF 60-65, myxomatous mitral valve, severe mitral regurgitation, partial flail leaflet of posterior mitral valve, myxomatous tricuspid valve with trivial TR, ascending aorta mildly dilated (39 mm)   Pneumonia    as child   PONV (postoperative nausea and vomiting)    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   Complex valvuloplasty including artificial Gore-tex neochord placement x6 with 28 mm Sorin Memo 4D ring annuloplasty via right mini thoracotomy approach   Solitary kidney    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     Past Surgical History:  Procedure Laterality Date   ABDOMINAL AORTOGRAM N/A 02/10/2020   Procedure: ABDOMINAL AORTOGRAM;  Surgeon: Corky Crafts, MD;  Location: [facility] INVASIVE CV LAB;  Service: Cardiovascular;  Laterality: N/A;   APPLICATION OF WOUND VAC  08/15/2021   Procedure: APPLICATION OF  WOUND VAC;  Surgeon: Emelia Loron, MD;  Location: [facility] OR;  Service: General;;   BIOPSY  01/07/2019   Procedure: BIOPSY;  Surgeon: Charlott Rakes, MD;  Location: [facility]OSCOPY;  Service: Endoscopy;;   BIOPSY  01/03/2020   Procedure: BIOPSY;  Surgeon: Charlott Rakes, MD;  Location: [facility] ENDOSCOPY;  Service: Endoscopy;;   COLON SURGERY  03/04/2019   left colon segmental resection   COLONOSCOPY WITH PROPOFOL N/A 01/07/2019   Procedure: COLONOSCOPY WITH PROPOFOL;  Surgeon: Charlott Rakes, MD;  Location: [facility]OSCOPY;  Service: Endoscopy;  Laterality: N/A;   COLONOSCOPY WITH PROPOFOL N/A 01/03/2020   Procedure: COLONOSCOPY WITH PROPOFOL;  Surgeon: Charlott Rakes, MD;  Location: [facility];  Service: Endoscopy;  Laterality: N/A;   INCISION AND DRAINAGE OF WOUND Right 08/13/2021   Procedure: IRRIGATION AND DEBRIDEMENT WOUND RIGHT GROIN;  Surgeon: Axel Filler, MD;  Location[facility] OR;  Service: General;  Laterality: Right;   INGUINAL HERNIA REPAIR Left 04/23/2016   Procedure: LAPAROSCOPIC  INGUINAL REPAIR;  Surgeon: Berna Bue, MD;  Location: [facility] OR;  Service: General;  Laterality: Left;   IR IMAGING GUIDED PORT INSERTION  05/08/2022   IRRIGATION AND DEBRIDEMENT ABSCESS Right 08/15/2021   Procedure: IRRIGATION AND DEBRIDEMENT OF GROIN;  Surgeon: Emelia Loron, MD;  Location: [facility] OR;  Service: General;  Laterality: Right;   IRRIGATION AND DEBRIDEMENT SEBACEOUS CYST N/A 03/19/2023   Procedure: DEBRIDEMENT OF CHRONIC INFECTION;  Surgeon: Romie Levee, MD;  Location: [facility];  Service: General;  Laterality: N/A;   KNEE SURGERY     arthroscopic left knee   LIPOMA EXCISION  2015   x20 [facility]   MITRAL VALVE REPAIR Right 05/09/2020   Procedure: MINIMALLY INVASIVE MITRAL VALVE REPAIR (MVR) USING LIVANOVA MEMO 4D MITRAL RING;  Surgeon: Purcell Nails, MD;  Location: [facility] OR;  Service: Open Heart Surgery;  Laterality: Right;   POLYPECTOMY  01/03/2020   Procedure:  POLYPECTOMY;  Surgeon: Charlott Rakes, MD;  Location: [facility] ENDOSCOPY;  Service: Endoscopy;;   PORT-A-CATH REMOVAL     PORTACATH PLACEMENT N/A 03/16/2019   Procedure: INSERTION PORT-A-CATH;  Surgeon: Karie Soda, MD;  Location: [facility] ORS;  Service: General;  Laterality: N/A;   RIGHT/LEFT HEART CATH AND CORONARY ANGIOGRAPHY N/A 02/10/2020   Procedure: RIGHT/LEFT HEART CATH AND CORONARY ANGIOGRAPHY;  Surgeon: Corky Crafts, MD;  Location: [facility] INVASIVE CV LAB;  Service: Cardiovascular;  Laterality: N/A;   SUBMUCOSAL INJECTION  01/07/2019   Procedure: SUBMUCOSAL INJECTION;  Surgeon: Charlott Rakes, MD;  Location: [facility] ENDOSCOPY;  Service: Endoscopy;;   TEE WITHOUT CARDIOVERSION N/A 02/10/2020   Procedure: TRANSESOPHAGEAL ECHOCARDIOGRAM (TEE);  Surgeon: Elease Hashimoto Deloris Ping, MD;  Location: [facility] ENDOSCOPY;  Service: Cardiovascular;  Laterality: N/A;  CATH AFTER TEE   TEE WITHOUT CARDIOVERSION N/A 05/09/2020   Procedure: TRANSESOPHAGEAL ECHOCARDIOGRAM (TEE);  Surgeon: Purcell Nails, MD;  Location: [facility] OR;  Service: Open Heart Surgery;  Laterality: N/A;   THYROIDECTOMY, PARTIAL  01/05/2009   Right Thyroid Lobectomy   TONSILLECTOMY     TOTAL NEPHRECTOMY Left 1970   Wilms Tumor left kidney excised as an infant     Outpatient Encounter Medications as of 06/25/2023  Medication Sig Note   acetaminophen (TYLENOL) 500 MG tablet Take 1 tablet (500 mg total) by mouth daily as needed for mild pain.    albuterol (PROVENTIL HFA;VENTOLIN HFA) 108 (90 BASE) MCG/ACT inhaler Inhale 1-2 puffs into the lungs every 6 (six) hours as needed for wheezing or shortness of breath.    aspirin EC 81 MG EC tablet Take 1 tablet (81 mg total) by mouth daily. Swallow whole.    atorvastatin (LIPITOR) 10 MG tablet Take 1 tablet (10 mg total) by mouth daily.    calcium carbonate (TUMS EX) 750 MG chewable tablet Chew 1,500 mg by mouth daily as needed for heartburn.    dabigatran (PRADAXA) 150 MG CAPS capsule Take 150 mg by mouth 2  (two) times daily.    dexamethasone (DECADRON) 4 MG tablet Take 1 tablet (4 mg total) by mouth daily. Start on 05/15/2023    HYDROmorphone (DILAUDID) 4 MG tablet Take 1 tablet (4 mg total) by mouth every 4 (four) hours as needed for severe pain (pain score 7-10).    ibuprofen (ADVIL) 200 MG tablet Take 200 mg by mouth daily as needed for mild pain.    insulin glargine, 1 Unit Dial, (TOUJEO SOLOSTAR) 300 UNIT/ML Solostar Pen Inject 41 Units into the skin daily.    lidocaine-prilocaine (EMLA) cream Apply 1 Application topically as needed (port irritation).    lisinopril (ZESTRIL) 5 MG tablet Take 5 mg by mouth daily.    loratadine (CLARITIN) 10 MG tablet Take 10 mg by mouth daily as needed for allergies.    metFORMIN (GLUCOPHAGE) 1000 MG tablet Take 1 tablet (1,000 mg total) by mouth 2 (two) times daily.    metoprolol succinate (TOPROL-XL) 25 MG 24 hr tablet Take 25 mg by mouth daily.    mometasone (ASMANEX) 220 MCG/INH inhaler Inhale 1 puff into the lungs daily.    morphine (MS CONTIN) 30 MG 12 hr tablet Take 1 tablet (30 mg total) by mouth every 12 (twelve) hours.    naloxone (NARCAN) nasal spray 4 mg/0.1 mL For use if Opoid overdose is suspected (Patient taking differently: Place 1 spray into the nose once. For use if Opoid overdose is suspected) 06/04/2023: Hasn't used   Omega-3 Fatty Acids (FISH OIL PO) Take 1,000 mg by mouth 2 (two) times daily.    ONE TOUCH ULTRA TEST test strip 1 each by Other route as needed (GLUCOSE).     pioglitazone (ACTOS) 30 MG tablet Take 30 mg by mouth daily.    polyethylene glycol powder (GLYCOLAX/MIRALAX) 17 GM/SCOOP powder Dissolve 17 g in liquid and take by mouth daily.    prochlorperazine (COMPAZINE) 10 MG tablet Take 1 tablet (10 mg total) by mouth every 6 (six) hours as needed.    repaglinide (PRANDIN) 2 MG tablet Take 2 mg by mouth 2 (two) times daily.    senna-docusate (SENOKOT-S) 8.6-50 MG tablet Take 1 tablet by mouth at bedtime. 06/04/2023: unknown    traZODone (DESYREL) 50 MG tablet Take 1 tablet (50 mg total) by mouth at bedtime as needed for sleep. (Patient not taking: Reported on 06/17/2023)    Facility-Administered Encounter Medications as of 06/25/2023  Medication   0.9 %  sodium chloride infusion (Manually program via Guardrails IV Fluids)     There were no vitals filed for this visit. There is no height or weight on file to calculate BMI.   PHYSICAL EXAM GENERAL:alert, no distress and comfortable SKIN: no rash  EYES: sclera clear NECK: without mass LYMPH:  no  palpable cervical or supraclavicular lymphadenopathy  LUNGS: clear with normal breathing effort HEART: regular rate & rhythm, no lower extremity edema ABDOMEN: abdomen soft, non-tender and normal bowel sounds NEURO: alert & oriented x 3 with fluent speech, no focal motor/sensory deficits Breast exam:  PAC without erythema    CBC    Component Value Date/Time   WBC 6.0 06/23/2023 1558   WBC 6.2 06/06/2023 0606   RBC 1.93 (L) 06/23/2023 1558   HGB 6.2 (LL) 06/23/2023 1558   HGB 13.9 02/08/2020 1128   HCT 20.1 (L) 06/23/2023 1558   HCT 40.9 02/08/2020 1128   PLT 70 (L) 06/23/2023 1558   PLT 114 (L) 02/08/2020 1128   MCV 104.1 (H) 06/23/2023 1558   MCV 97 02/08/2020 1128   MCH 32.1 06/23/2023 1558   MCHC 30.8 06/23/2023 1558   RDW 23.6 (H) 06/23/2023 1558   RDW 13.5 02/08/2020 1128   LYMPHSABS 1.1 06/23/2023 1558   MONOABS 0.7 06/23/2023 1558   EOSABS 0.1 06/23/2023 1558   BASOSABS 0.0 06/23/2023 1558     CMP     Component Value Date/Time   NA 128 (L) 06/23/2023 1558   NA 139 04/18/2022 1047   K 5.1 06/23/2023 1558   CL 95 (L) 06/23/2023 1558   CO2 23 06/23/2023 1558   GLUCOSE 203 (H) 06/23/2023 1558   BUN 21 (H) 06/23/2023 1558   BUN 18 04/18/2022 1047   CREATININE 0.66 06/23/2023 1558   CREATININE 1.07 01/04/2015 0815   CALCIUM 7.9 (L) 06/23/2023 1558   PROT 5.8 (L) 06/23/2023 1558   ALBUMIN 3.2 (L) 06/23/2023 1558   AST 29 06/23/2023 1558    ALT 11 06/23/2023 1558   ALKPHOS 483 (H) 06/23/2023 1558   BILITOT 0.7 06/23/2023 1558   GFRNONAA >60 06/23/2023 1558   GFRAA 115 02/08/2020 1128   GFRAA >60 08/22/2019 1118     ASSESSMENT & PLAN:  PLAN:  No orders of the defined types were placed in this encounter.     All questions were answered. The patient knows to call the clinic with any problems, questions or concerns. No barriers to learning were detected. I spent *** counseling the patient face to face. The total time spent in the appointment was *** and more than 50% was on counseling, review of test results, and coordination of care.   Santiago Glad, NP-C @DATE @

## 2023-06-24 NOTE — Patient Instructions (Signed)

## 2023-06-25 ENCOUNTER — Inpatient Hospital Stay: Admitting: Nurse Practitioner

## 2023-06-25 ENCOUNTER — Inpatient Hospital Stay

## 2023-06-25 LAB — TYPE AND SCREEN
ABO/RH(D): A POS
Antibody Screen: NEGATIVE
Unit division: 0
Unit division: 0

## 2023-06-25 LAB — BPAM RBC
Blood Product Expiration Date: 202504202359
Blood Product Unit Number: 202504202359
ISSUE DATE / TIME: 202503260853
PRODUCT CODE: 202503260853
PRODUCT CODE: 202504202359
Unit Type and Rh: 6200
Unit Type and Rh: 202504202359
Unit Type and Rh: 6200
Unit Type and Rh: 6200

## 2023-06-26 ENCOUNTER — Other Ambulatory Visit: Payer: Self-pay | Admitting: Oncology

## 2023-06-26 DIAGNOSIS — C189 Malignant neoplasm of colon, unspecified: Secondary | ICD-10-CM

## 2023-06-27 ENCOUNTER — Inpatient Hospital Stay

## 2023-06-28 ENCOUNTER — Inpatient Hospital Stay (HOSPITAL_COMMUNITY)
Admission: EM | Admit: 2023-06-28 | Discharge: 2023-07-11 | DRG: 393 | Disposition: A | Discharge status: Hospice | Attending: Internal Medicine | Admitting: Internal Medicine

## 2023-06-28 ENCOUNTER — Encounter (HOSPITAL_COMMUNITY): Payer: Self-pay

## 2023-06-28 ENCOUNTER — Emergency Department (HOSPITAL_COMMUNITY)

## 2023-06-28 ENCOUNTER — Other Ambulatory Visit: Payer: Self-pay

## 2023-06-28 DIAGNOSIS — E8809 Other disorders of plasma-protein metabolism, not elsewhere classified: Secondary | ICD-10-CM | POA: Diagnosis present

## 2023-06-28 DIAGNOSIS — D62 Acute posthemorrhagic anemia: Secondary | ICD-10-CM | POA: Diagnosis not present

## 2023-06-28 DIAGNOSIS — R7401 Elevation of levels of liver transaminase levels: Secondary | ICD-10-CM | POA: Diagnosis present

## 2023-06-28 DIAGNOSIS — M549 Dorsalgia, unspecified: Secondary | ICD-10-CM

## 2023-06-28 DIAGNOSIS — Z9221 Personal history of antineoplastic chemotherapy: Secondary | ICD-10-CM | POA: Diagnosis not present

## 2023-06-28 DIAGNOSIS — R945 Abnormal results of liver function studies: Secondary | ICD-10-CM | POA: Diagnosis not present

## 2023-06-28 DIAGNOSIS — I340 Nonrheumatic mitral (valve) insufficiency: Secondary | ICD-10-CM | POA: Diagnosis present

## 2023-06-28 DIAGNOSIS — Z823 Family history of stroke: Secondary | ICD-10-CM

## 2023-06-28 DIAGNOSIS — Z800 Family history of malignant neoplasm of digestive organs: Secondary | ICD-10-CM

## 2023-06-28 DIAGNOSIS — Z9109 Other allergy status, other than to drugs and biological substances: Secondary | ICD-10-CM

## 2023-06-28 DIAGNOSIS — R000 Tachycardia, unspecified: Secondary | ICD-10-CM | POA: Diagnosis present

## 2023-06-28 DIAGNOSIS — H918X2 Other specified hearing loss, left ear: Secondary | ICD-10-CM | POA: Diagnosis present

## 2023-06-28 DIAGNOSIS — D65 Disseminated intravascular coagulation [defibrination syndrome]: Secondary | ICD-10-CM | POA: Diagnosis not present

## 2023-06-28 DIAGNOSIS — I10 Essential (primary) hypertension: Secondary | ICD-10-CM | POA: Diagnosis not present

## 2023-06-28 DIAGNOSIS — K922 Gastrointestinal hemorrhage, unspecified: Secondary | ICD-10-CM | POA: Diagnosis not present

## 2023-06-28 DIAGNOSIS — E785 Hyperlipidemia, unspecified: Secondary | ICD-10-CM | POA: Diagnosis not present

## 2023-06-28 DIAGNOSIS — Z9049 Acquired absence of other specified parts of digestive tract: Secondary | ICD-10-CM

## 2023-06-28 DIAGNOSIS — D7589 Other specified diseases of blood and blood-forming organs: Secondary | ICD-10-CM | POA: Diagnosis present

## 2023-06-28 DIAGNOSIS — Z794 Long term (current) use of insulin: Secondary | ICD-10-CM | POA: Diagnosis not present

## 2023-06-28 DIAGNOSIS — K633 Ulcer of intestine: Principal | ICD-10-CM | POA: Diagnosis present

## 2023-06-28 DIAGNOSIS — E871 Hypo-osmolality and hyponatremia: Secondary | ICD-10-CM | POA: Diagnosis present

## 2023-06-28 DIAGNOSIS — Z87892 Personal history of anaphylaxis: Secondary | ICD-10-CM

## 2023-06-28 DIAGNOSIS — J45909 Unspecified asthma, uncomplicated: Secondary | ICD-10-CM | POA: Diagnosis present

## 2023-06-28 DIAGNOSIS — K625 Hemorrhage of anus and rectum: Secondary | ICD-10-CM | POA: Diagnosis not present

## 2023-06-28 DIAGNOSIS — R54 Age-related physical debility: Secondary | ICD-10-CM | POA: Diagnosis present

## 2023-06-28 DIAGNOSIS — Z7984 Long term (current) use of oral hypoglycemic drugs: Secondary | ICD-10-CM

## 2023-06-28 DIAGNOSIS — K2100 Gastro-esophageal reflux disease with esophagitis, without bleeding: Secondary | ICD-10-CM | POA: Diagnosis not present

## 2023-06-28 DIAGNOSIS — H1132 Conjunctival hemorrhage, left eye: Secondary | ICD-10-CM | POA: Diagnosis present

## 2023-06-28 DIAGNOSIS — Z79899 Other long term (current) drug therapy: Secondary | ICD-10-CM

## 2023-06-28 DIAGNOSIS — Z86718 Personal history of other venous thrombosis and embolism: Secondary | ICD-10-CM | POA: Diagnosis not present

## 2023-06-28 DIAGNOSIS — Z85528 Personal history of other malignant neoplasm of kidney: Secondary | ICD-10-CM

## 2023-06-28 DIAGNOSIS — E1165 Type 2 diabetes mellitus with hyperglycemia: Secondary | ICD-10-CM | POA: Diagnosis not present

## 2023-06-28 DIAGNOSIS — C189 Malignant neoplasm of colon, unspecified: Secondary | ICD-10-CM | POA: Diagnosis present

## 2023-06-28 DIAGNOSIS — Z7952 Long term (current) use of systemic steroids: Secondary | ICD-10-CM

## 2023-06-28 DIAGNOSIS — J45998 Other asthma: Secondary | ICD-10-CM | POA: Diagnosis not present

## 2023-06-28 DIAGNOSIS — Z7189 Other specified counseling: Secondary | ICD-10-CM | POA: Diagnosis not present

## 2023-06-28 DIAGNOSIS — Z66 Do not resuscitate: Secondary | ICD-10-CM | POA: Diagnosis not present

## 2023-06-28 DIAGNOSIS — G893 Neoplasm related pain (acute) (chronic): Secondary | ICD-10-CM | POA: Diagnosis present

## 2023-06-28 DIAGNOSIS — R0682 Tachypnea, not elsewhere classified: Secondary | ICD-10-CM | POA: Diagnosis not present

## 2023-06-28 DIAGNOSIS — Z905 Acquired absence of kidney: Secondary | ICD-10-CM

## 2023-06-28 DIAGNOSIS — R112 Nausea with vomiting, unspecified: Secondary | ICD-10-CM | POA: Diagnosis not present

## 2023-06-28 DIAGNOSIS — C7931 Secondary malignant neoplasm of brain: Secondary | ICD-10-CM | POA: Diagnosis not present

## 2023-06-28 DIAGNOSIS — K3189 Other diseases of stomach and duodenum: Secondary | ICD-10-CM | POA: Diagnosis present

## 2023-06-28 DIAGNOSIS — K5900 Constipation, unspecified: Secondary | ICD-10-CM

## 2023-06-28 DIAGNOSIS — K8020 Calculus of gallbladder without cholecystitis without obstruction: Secondary | ICD-10-CM | POA: Diagnosis not present

## 2023-06-28 DIAGNOSIS — I2510 Atherosclerotic heart disease of native coronary artery without angina pectoris: Secondary | ICD-10-CM | POA: Diagnosis present

## 2023-06-28 DIAGNOSIS — C186 Malignant neoplasm of descending colon: Secondary | ICD-10-CM | POA: Diagnosis not present

## 2023-06-28 DIAGNOSIS — Z7401 Bed confinement status: Secondary | ICD-10-CM

## 2023-06-28 DIAGNOSIS — K828 Other specified diseases of gallbladder: Secondary | ICD-10-CM | POA: Diagnosis present

## 2023-06-28 DIAGNOSIS — R58 Hemorrhage, not elsewhere classified: Secondary | ICD-10-CM | POA: Diagnosis not present

## 2023-06-28 DIAGNOSIS — K921 Melena: Secondary | ICD-10-CM | POA: Diagnosis not present

## 2023-06-28 DIAGNOSIS — Z79891 Long term (current) use of opiate analgesic: Secondary | ICD-10-CM

## 2023-06-28 DIAGNOSIS — T451X5A Adverse effect of antineoplastic and immunosuppressive drugs, initial encounter: Secondary | ICD-10-CM | POA: Diagnosis present

## 2023-06-28 DIAGNOSIS — Z8249 Family history of ischemic heart disease and other diseases of the circulatory system: Secondary | ICD-10-CM

## 2023-06-28 DIAGNOSIS — K838 Other specified diseases of biliary tract: Secondary | ICD-10-CM | POA: Diagnosis not present

## 2023-06-28 DIAGNOSIS — T39395A Adverse effect of other nonsteroidal anti-inflammatory drugs [NSAID], initial encounter: Secondary | ICD-10-CM | POA: Diagnosis present

## 2023-06-28 DIAGNOSIS — Z7902 Long term (current) use of antithrombotics/antiplatelets: Secondary | ICD-10-CM

## 2023-06-28 DIAGNOSIS — R739 Hyperglycemia, unspecified: Secondary | ICD-10-CM | POA: Diagnosis not present

## 2023-06-28 DIAGNOSIS — Z87440 Personal history of urinary (tract) infections: Secondary | ICD-10-CM

## 2023-06-28 DIAGNOSIS — Z8701 Personal history of pneumonia (recurrent): Secondary | ICD-10-CM

## 2023-06-28 DIAGNOSIS — C7951 Secondary malignant neoplasm of bone: Secondary | ICD-10-CM | POA: Diagnosis present

## 2023-06-28 DIAGNOSIS — R531 Weakness: Secondary | ICD-10-CM

## 2023-06-28 DIAGNOSIS — K810 Acute cholecystitis: Secondary | ICD-10-CM | POA: Diagnosis not present

## 2023-06-28 DIAGNOSIS — E119 Type 2 diabetes mellitus without complications: Secondary | ICD-10-CM | POA: Diagnosis not present

## 2023-06-28 DIAGNOSIS — Z7901 Long term (current) use of anticoagulants: Secondary | ICD-10-CM

## 2023-06-28 DIAGNOSIS — Z515 Encounter for palliative care: Secondary | ICD-10-CM

## 2023-06-28 DIAGNOSIS — Z7982 Long term (current) use of aspirin: Secondary | ICD-10-CM

## 2023-06-28 DIAGNOSIS — Z888 Allergy status to other drugs, medicaments and biological substances status: Secondary | ICD-10-CM

## 2023-06-28 LAB — COMPREHENSIVE METABOLIC PANEL WITH GFR
ALT: 18 U/L (ref 0–44)
AST: 41 U/L (ref 15–41)
Albumin: 2 g/dL — ABNORMAL LOW (ref 3.5–5.0)
Alkaline Phosphatase: 676 U/L — ABNORMAL HIGH (ref 38–126)
Anion gap: 10 (ref 5–15)
BUN: 24 mg/dL — ABNORMAL HIGH (ref 6–20)
CO2: 20 mmol/L — ABNORMAL LOW (ref 22–32)
Calcium: 7.6 mg/dL — ABNORMAL LOW (ref 8.9–10.3)
Chloride: 99 mmol/L (ref 98–111)
Creatinine, Ser: 0.62 mg/dL (ref 0.61–1.24)
GFR, Estimated: >60 mL/min (ref >60)
Glucose, Bld: 251 mg/dL — ABNORMAL HIGH (ref 70–99)
Potassium: 5 mmol/L (ref 3.5–5.1)
Sodium: 129 mmol/L — ABNORMAL LOW (ref 135–145)
Total Bilirubin: 0.9 mg/dL (ref 0.0–1.2)
Total Protein: 5.5 g/dL — ABNORMAL LOW (ref 6.5–8.1)

## 2023-06-28 LAB — CBC WITH DIFFERENTIAL/PLATELET
Abs Immature Granulocytes: 0.31 10*3/uL — ABNORMAL HIGH (ref 0.00–0.07)
Basophils Absolute: 0 10*3/uL (ref 0.0–0.1)
Basophils Relative: 0 %
Eosinophils Absolute: 0.1 10*3/uL (ref 0.0–0.5)
Eosinophils Relative: 2 %
HCT: 21 % — ABNORMAL LOW (ref 39.0–52.0)
Hemoglobin: 6.5 g/dL — CL (ref 13.0–17.0)
Immature Granulocytes: 7 %
Lymphocytes Relative: 14 %
Lymphs Abs: 0.6 10*3/uL — ABNORMAL LOW (ref 0.7–4.0)
MCH: 31.3 pg (ref 26.0–34.0)
MCHC: 31 g/dL (ref 30.0–36.0)
MCV: 101 fL — ABNORMAL HIGH (ref 80.0–100.0)
Monocytes Absolute: 1 10*3/uL (ref 0.1–1.0)
Monocytes Relative: 22 %
Neutro Abs: 2.6 10*3/uL (ref 1.7–7.7)
Neutrophils Relative %: 55 %
Platelets: 74 10*3/uL — ABNORMAL LOW (ref 150–400)
RBC: 2.08 MIL/uL — ABNORMAL LOW (ref 4.22–5.81)
RDW: 22 % — ABNORMAL HIGH (ref 11.5–15.5)
WBC: 4.7 10*3/uL (ref 4.0–10.5)
nRBC: 7.7 % — ABNORMAL HIGH (ref 0.0–0.2)

## 2023-06-28 LAB — GLUCOSE, CAPILLARY
Glucose-Capillary: 151 mg/dL — ABNORMAL HIGH (ref 70–99)
Glucose-Capillary: 111 mg/dL — ABNORMAL HIGH (ref 70–99)

## 2023-06-28 LAB — I-STAT CHEM 8, ED
BUN: 21 mg/dL — ABNORMAL HIGH (ref 6–20)
Calcium, Ion: 1.03 mmol/L — ABNORMAL LOW (ref 1.15–1.40)
Chloride: 99 mmol/L (ref 98–111)
Creatinine, Ser: 0.6 mg/dL — ABNORMAL LOW (ref 0.61–1.24)
Glucose, Bld: 242 mg/dL — ABNORMAL HIGH (ref 70–99)
HCT: 19 % — ABNORMAL LOW (ref 39.0–52.0)
Hemoglobin: 6.5 g/dL — CL (ref 13.0–17.0)
Potassium: 4.8 mmol/L (ref 3.5–5.1)
Sodium: 130 mmol/L — ABNORMAL LOW (ref 135–145)
TCO2: 18 mmol/L — ABNORMAL LOW (ref 22–32)

## 2023-06-28 LAB — PREPARE RBC (CROSSMATCH)

## 2023-06-28 LAB — CBG MONITORING, ED: Glucose-Capillary: 169 mg/dL — ABNORMAL HIGH (ref 70–99)

## 2023-06-28 MED ORDER — METOPROLOL SUCCINATE ER 25 MG PO TB24
25.0000 mg | ORAL_TABLET | Freq: Every day | ORAL | Status: DC
  Administered 2023-06-28 – 2023-07-11 (×14): 25 mg via ORAL
  Filled 2023-06-28 (×14): qty 1

## 2023-06-28 MED ORDER — ACETAMINOPHEN 650 MG RE SUPP: 650.0000 mg | Freq: Four times a day (QID) | RECTAL | Status: DC | PRN

## 2023-06-28 MED ORDER — ONDANSETRON HCL 4 MG/2ML IJ SOLN
4.0000 mg | Freq: Four times a day (QID) | INTRAMUSCULAR | Status: DC | PRN
  Administered 2023-06-30 – 2023-07-07 (×3): 4 mg via INTRAVENOUS
  Filled 2023-06-28 (×2): qty 2

## 2023-06-28 MED ORDER — SODIUM CHLORIDE 0.9 % IV BOLUS
2000.0000 mL | Freq: Once | INTRAVENOUS | Status: AC
  Administered 2023-06-28: 2000 mL via INTRAVENOUS

## 2023-06-28 MED ORDER — SODIUM CHLORIDE 0.9% IV SOLUTION: Freq: Once | INTRAVENOUS | Status: DC

## 2023-06-28 MED ORDER — ACETAMINOPHEN 325 MG PO TABS: 650.0000 mg | ORAL_TABLET | Freq: Four times a day (QID) | ORAL | Status: DC | PRN

## 2023-06-28 MED ORDER — SODIUM CHLORIDE 0.9% FLUSH
10.0000 mL | INTRAVENOUS | Status: DC | PRN
  Administered 2023-06-30: 10 mL

## 2023-06-28 MED ORDER — SODIUM CHLORIDE 0.9 % IV SOLN: INTRAVENOUS | Status: DC

## 2023-06-28 MED ORDER — IOHEXOL 350 MG/ML SOLN
100.0000 mL | Freq: Once | INTRAVENOUS | Status: AC | PRN
  Administered 2023-06-28: 80 mL via INTRAVENOUS

## 2023-06-28 MED ORDER — HYDROMORPHONE HCL 1 MG/ML IJ SOLN
0.5000 mg | INTRAMUSCULAR | Status: DC | PRN
  Administered 2023-06-28 – 2023-07-02 (×21): 1 mg via INTRAVENOUS
  Filled 2023-06-28 (×21): qty 1

## 2023-06-28 MED ORDER — INSULIN GLARGINE-YFGN 100 UNIT/ML ~~LOC~~ SOLN
20.0000 [IU] | Freq: Every day | SUBCUTANEOUS | Status: DC
  Administered 2023-06-29 – 2023-07-05 (×7): 20 [IU] via SUBCUTANEOUS
  Filled 2023-06-28 (×7): qty 0.2

## 2023-06-28 MED ORDER — CHLORHEXIDINE GLUCONATE CLOTH 2 % EX PADS
6.0000 | MEDICATED_PAD | Freq: Every day | CUTANEOUS | Status: DC
Start: 2023-06-28 — End: 2023-07-11
  Administered 2023-06-28 – 2023-07-11 (×12): 6 via TOPICAL

## 2023-06-28 MED ORDER — ATORVASTATIN CALCIUM 10 MG PO TABS
10.0000 mg | ORAL_TABLET | Freq: Every day | ORAL | Status: DC
  Administered 2023-06-29 – 2023-07-02 (×4): 10 mg via ORAL
  Filled 2023-06-28 (×5): qty 1

## 2023-06-28 MED ORDER — MORPHINE SULFATE ER 15 MG PO TBCR
30.0000 mg | EXTENDED_RELEASE_TABLET | Freq: Two times a day (BID) | ORAL | Status: DC
  Administered 2023-06-28 – 2023-06-29 (×3): 30 mg via ORAL
  Filled 2023-06-28 (×3): qty 2

## 2023-06-28 MED ORDER — ONDANSETRON HCL 4 MG PO TABS: 4.0000 mg | ORAL_TABLET | Freq: Four times a day (QID) | ORAL | Status: DC | PRN

## 2023-06-28 MED ORDER — MORPHINE SULFATE (PF) 4 MG/ML IV SOLN
8.0000 mg | Freq: Once | INTRAVENOUS | Status: AC
  Administered 2023-06-28: 8 mg via INTRAVENOUS
  Filled 2023-06-28: qty 2

## 2023-06-28 MED ORDER — HYDROMORPHONE HCL 2 MG PO TABS
4.0000 mg | ORAL_TABLET | ORAL | Status: DC | PRN
  Administered 2023-06-29: 4 mg via ORAL
  Filled 2023-06-28: qty 2

## 2023-06-28 MED ORDER — ALBUTEROL SULFATE (2.5 MG/3ML) 0.083% IN NEBU: 2.5000 mg | INHALATION_SOLUTION | RESPIRATORY_TRACT | Status: DC | PRN

## 2023-06-28 MED ORDER — INSULIN ASPART 100 UNIT/ML IJ SOLN
0.0000 [IU] | INTRAMUSCULAR | Status: DC
  Administered 2023-06-28 (×2): 3 [IU] via SUBCUTANEOUS
  Administered 2023-06-29 – 2023-06-30 (×2): 2 [IU] via SUBCUTANEOUS
  Filled 2023-06-28: qty 0.15

## 2023-06-28 NOTE — H&P (Signed)
 History and Physical  [name] ZOX:[id number] DOB: Jun 20, 1965 DOA: 06/28/2023  PCP: Joycelyn Rua, MD   Chief Complaint: Bright red blood per rectum  HPI: [name] is a 58 y.o. male with medical history significant for colon cancer on FOLFOX/Avastin under the care of [name], insulin-dependent type 2 diabetes, hypertension, hyperlipidemia who is also on Pradaxa due to history of DVT who is being admitted to the hospital with bright red blood per rectum and acute blood loss anemia.  He was recently discharged for gluteal fold cellulitis, recovered well and has been doing fine.  Last took his Pradaxa last night about 10 PM, this morning noticed bright red blood per rectum, had multiple bowel movements that were bright red.  Denies any nausea, vomiting, abdominal pain or other concerns.  On evaluation the emergency department, he has recurrent anemia.  ER provider has consulted his gastroenterologist Dr. Bosie Clos, patient is receiving 2 unit blood transfusion.  Hospitalist admission was requested.  Review of Systems: Please see HPI for pertinent positives and negatives. A complete 10 system review of systems are otherwise negative.  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   History of hiatal hernia    Hyperlipidemia    Hypertension    Diagnostic exercise tolerance test assessment:04/30/2010 : comments normal -no evidence os ischemia by ST analysis   lipoma    Lipoma 01/26/2019   Mitral regurgitation    Echo  12/2018: EF 60-65, myxomatous mitral valve, severe mitral regurgitation, partial flail leaflet of posterior mitral valve, myxomatous tricuspid valve with trivial TR, ascending aorta mildly dilated (39 mm)   Pneumonia    as child   PONV (postoperative nausea and vomiting)    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   Complex valvuloplasty including artificial Gore-tex neochord placement x6 with 28 mm Sorin Memo 4D ring annuloplasty via right mini thoracotomy approach   Solitary kidney    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   Past Surgical History:  Procedure Laterality Date   ABDOMINAL AORTOGRAM N/A 02/10/2020   Procedure: ABDOMINAL AORTOGRAM;  Surgeon: Corky Crafts, MD;  Location: [facility] INVASIVE CV LAB;  Service: Cardiovascular;  Laterality: N/A;   APPLICATION OF WOUND VAC  08/15/2021   Procedure: APPLICATION OF WOUND VAC;  Surgeon: Emelia Loron, MD;  Location: [facility] OR;  Service: General;;   BIOPSY  01/07/2019   Procedure: BIOPSY;  Surgeon: Charlott Rakes, MD;  Location: [facility] ENDOSCOPY;  Service: Endoscopy;;   BIOPSY  01/03/2020   Procedure: BIOPSY;  Surgeon: Charlott Rakes, MD;  Location: [facility] ENDOSCOPY;  Service: Endoscopy;;   COLON SURGERY  03/04/2019   left colon segmental resection   COLONOSCOPY WITH PROPOFOL N/A 01/07/2019   Procedure: COLONOSCOPY WITH PROPOFOL;  Surgeon: Charlott Rakes, MD;  Location: [facility] ENDOSCOPY;  Service: Endoscopy;  Laterality: N/A;   COLONOSCOPY WITH PROPOFOL N/A 01/03/2020   Procedure:  COLONOSCOPY WITH PROPOFOL;  Surgeon: Charlott Rakes, MD;  Location: [facility] ENDOSCOPY;  Service: Endoscopy;  Laterality: N/A;   INCISION AND DRAINAGE OF WOUND Right 08/13/2021   Procedure: IRRIGATION AND DEBRIDEMENT WOUND RIGHT GROIN;  Surgeon: Axel Filler, MD;  Location: [facility] OR;  Service: General;  Laterality: Right;   INGUINAL HERNIA REPAIR Left 04/23/2016   Procedure: LAPAROSCOPIC  INGUINAL REPAIR;  Surgeon: Berna Bue, MD;  Location: [facility] OR;  Service: General;  Laterality: Left;   IR IMAGING GUIDED PORT INSERTION  05/08/2022   IRRIGATION AND DEBRIDEMENT ABSCESS Right 08/15/2021   Procedure: IRRIGATION AND DEBRIDEMENT OF GROIN;  Surgeon: Emelia Loron, MD;  Location: [facility] OR;  Service: General;  Laterality: Right;   IRRIGATION AND DEBRIDEMENT SEBACEOUS CYST N/A 03/19/2023   Procedure: DEBRIDEMENT OF CHRONIC INFECTION;  Surgeon: Romie Levee, MD;  Location: [facility];  Service: General;  Laterality: N/A;   KNEE SURGERY     arthroscopic left knee   LIPOMA EXCISION  2015   x20 [facility]   MITRAL VALVE REPAIR Right 05/09/2020   Procedure: MINIMALLY INVASIVE MITRAL VALVE REPAIR (MVR) USING LIVANOVA MEMO 4D MITRAL RING;  Surgeon: Purcell Nails, MD;  Location: [facility] OR;  Service: Open Heart Surgery;  Laterality: Right;   POLYPECTOMY  01/03/2020   Procedure: POLYPECTOMY;  Surgeon: Charlott Rakes, MD;  Location: [facility] ENDOSCOPY;  Service: Endoscopy;;   PORT-A-CATH REMOVAL     PORTACATH PLACEMENT N/A 03/16/2019   Procedure: INSERTION PORT-A-CATH;  Surgeon: Karie Soda, MD;  Location: [facility] ORS;  Service: General;  Laterality: N/A;   RIGHT/LEFT HEART CATH AND CORONARY ANGIOGRAPHY N/A 02/10/2020   Procedure: RIGHT/LEFT HEART CATH AND CORONARY ANGIOGRAPHY;  Surgeon: Corky Crafts, MD;  Location: [facility] INVASIVE CV LAB;  Service: Cardiovascular;  Laterality: N/A;   SUBMUCOSAL INJECTION  01/07/2019   Procedure: SUBMUCOSAL INJECTION;  Surgeon: Charlott Rakes, MD;  Location: [facility] ENDOSCOPY;  Service: Endoscopy;;   TEE WITHOUT CARDIOVERSION N/A 02/10/2020   Procedure: TRANSESOPHAGEAL ECHOCARDIOGRAM (TEE);  Surgeon: Elease Hashimoto Deloris Ping, MD;  Location: [facility] ENDOSCOPY;  Service: Cardiovascular;  Laterality: N/A;  CATH AFTER TEE   TEE WITHOUT CARDIOVERSION N/A 05/09/2020   Procedure: TRANSESOPHAGEAL ECHOCARDIOGRAM (TEE);  Surgeon: Purcell Nails, MD;  Location: [facility] OR;  Service: Open Heart Surgery;   Laterality: N/A;   THYROIDECTOMY, PARTIAL  01/05/2009   Right Thyroid Lobectomy   TONSILLECTOMY     TOTAL NEPHRECTOMY Left 1970   Wilms Tumor left kidney excised as an infant   Social History:  reports that he has never smoked. He has never used smokeless tobacco. He reports that he does not drink alcohol and does not use drugs.  Allergies  Allergen Reactions   Cat Dander Anaphylaxis   Empagliflozin Other (See Comments)    Recurrent UTI    Family History  Problem Relation Age of Onset   Heart disease Father    Hernia Father    Healthy Sister    Stroke Paternal Grandmother    Cancer Mother        THYROID   Hypotension Mother    Heart attack Maternal Grandfather    Congestive Heart Failure Maternal Grandfather    Heart attack Paternal Grandfather    Colon cancer Maternal Aunt 79   Congestive Heart Failure Paternal Uncle    Congestive Heart Failure Maternal Grandmother    Colon cancer Other 5       MGMs brother   Colon cancer Other        MGFs  brother     Prior to Admission medications   Medication Sig Start Date End Date Taking? Authorizing Provider  acetaminophen (TYLENOL) 500 MG tablet Take 1 tablet (500 mg total) by mouth daily as needed for mild pain. 12/29/21   Redwine, Madison A, PA-C  albuterol (PROVENTIL HFA;VENTOLIN HFA) 108 (90 BASE) MCG/ACT inhaler Inhale 1-2 puffs into the lungs every 6 (six) hours as needed for wheezing or shortness of breath.    [provider]  aspirin EC 81 MG EC tablet Take 1 tablet (81 mg total) by mouth daily. Swallow whole. 05/15/20   Leary Roca, PA-C  atorvastatin (LIPITOR) 10 MG tablet Take 1 tablet (10 mg total) by mouth daily. 03/15/14   Angiulli, Mcarthur Rossetti, PA-C  calcium carbonate (TUMS EX) 750 MG chewable tablet Chew 1,500 mg by mouth daily as needed for heartburn.    [provider]  dabigatran (PRADAXA) 150 MG CAPS capsule Take 150 mg by mouth 2 (two) times daily.    [provider]   dexamethasone (DECADRON) 4 MG tablet Take 1 tablet (4 mg total) by mouth daily. Start on 05/15/2023 05/06/23   Ladene Artist, MD  HYDROmorphone (DILAUDID) 4 MG tablet Take 1 tablet (4 mg total) by mouth every 4 (four) hours as needed for severe pain (pain score 7-10). 06/10/23   Rana Snare, NP  ibuprofen (ADVIL) 200 MG tablet Take 200 mg by mouth daily as needed for mild pain.    [provider]  insulin glargine, 1 Unit Dial, (TOUJEO SOLOSTAR) 300 UNIT/ML Solostar Pen Inject 41 Units into the skin daily. 04/05/21   [provider]  lidocaine-prilocaine (EMLA) cream Apply 1 Application topically as needed (port irritation).    [provider]  lisinopril (ZESTRIL) 5 MG tablet Take 5 mg by mouth daily. 05/14/23   [provider]  loratadine (CLARITIN) 10 MG tablet Take 10 mg by mouth daily as needed for allergies.    [provider]  metFORMIN (GLUCOPHAGE) 1000 MG tablet Take 1 tablet (1,000 mg total) by mouth 2 (two) times daily. 02/12/20   Corky Crafts, MD  metoprolol succinate (TOPROL-XL) 25 MG 24 hr tablet Take 25 mg by mouth daily.    [provider]  mometasone (ASMANEX) 220 MCG/INH inhaler Inhale 1 puff into the lungs daily.    [provider]  morphine (MS CONTIN) 30 MG 12 hr tablet Take 1 tablet (30 mg total) by mouth every 12 (twelve) hours. 06/10/23   Rana Snare, NP  naloxone [facility]) nasal spray 4 mg/0.1 mL For use if Opoid overdose is suspected Patient taking differently: Place 1 spray into the nose once. For use if Opoid overdose is suspected 05/28/23   Briant Cedar, MD  Omega-3 Fatty Acids (FISH OIL PO) Take 1,000 mg by mouth 2 (two) times daily.    [provider]  ONE TOUCH ULTRA TEST test strip 1 each by Other route as needed (GLUCOSE).  05/06/15   [provider]  pioglitazone (ACTOS) 30 MG tablet Take 30 mg by mouth daily.    [provider]  polyethylene glycol powder  (GLYCOLAX/MIRALAX) 17 GM/SCOOP powder Dissolve 17 g in liquid and take by mouth daily. 05/28/23   Briant Cedar, MD  prochlorperazine (COMPAZINE) 10 MG tablet Take 1 tablet (10 mg total) by mouth every 6 (six) hours as needed. 06/10/23   Rana Snare, NP  repaglinide (PRANDIN) 2 MG tablet Take 2 mg by mouth 2 (two)  times daily. 05/16/20   [provider]  senna-docusate (SENOKOT-S) 8.6-50 MG tablet Take 1 tablet by mouth at bedtime. 05/28/23   Briant Cedar, MD  traZODone (DESYREL) 50 MG tablet Take 1 tablet (50 mg total) by mouth at bedtime as needed for sleep. Patient not taking: Reported on 06/17/2023 06/06/23 07/06/23  [name], MD    Physical Exam: BP 124/76   Pulse (!) 110   Temp 97.9 F (36.6 C)   Resp 19   Ht 5\' 11"  (1.803 m)   Wt 81.6 kg   SpO2 100%   BMI 25.10 kg/m  General:  Alert, oriented, calm, in no acute distress, his wife is at the bedside.  He looks slightly pale, but comfortable.  Blood infusing through his right anterior chest port. Cardiovascular: RRR, no murmurs or rubs, no peripheral edema  Respiratory: clear to auscultation bilaterally, no wheezes, no crackles  Abdomen: soft, nontender, nondistended, normal bowel tones heard  Skin: dry, no rashes  Musculoskeletal: no joint effusions, normal range of motion  Psychiatric: appropriate affect, normal speech  Neurologic: extraocular muscles intact, clear speech, moving all extremities with intact sensorium         Labs on Admission:  Basic Metabolic Panel: Recent Labs  Lab 06/23/23 1558 06/28/23 1000 06/28/23 1008  NA 128* 129* 130*  K 5.1 5.0 4.8  CL 95* 99 99  CO2 23 20*  --   GLUCOSE 203* 251* 242*  BUN 21* 24* 21*  CREATININE 0.66 0.62 0.60*  CALCIUM 7.9* 7.6*  --    Liver Function Tests: Recent Labs  Lab 06/23/23 1558 06/28/23 1000  AST 29 41  ALT 11 18  ALKPHOS 483* 676*  BILITOT 0.7 0.9  PROT 5.8* 5.5*  ALBUMIN 3.2* 2.0*   No results for input(s): "LIPASE",  "AMYLASE" in the last 168 hours. No results for input(s): "AMMONIA" in the last 168 hours. CBC: Recent Labs  Lab 06/23/23 1558 06/28/23 1000 06/28/23 1008  WBC 6.0 4.7  --   NEUTROABS 3.9 2.6  --   HGB 6.2* 6.5* 6.5*  HCT 20.1* 21.0* 19.0*  MCV 104.1* 101.0*  --   PLT 70* 74*  --    Cardiac Enzymes: No results for input(s): "CKTOTAL", "CKMB", "CKMBINDEX", "TROPONINI" in the last 168 hours. BNP (last 3 results) No results for input(s): "BNP" in the last 8760 hours.  ProBNP (last 3 results) No results for input(s): "PROBNP" in the last 8760 hours.  CBG: No results for input(s): "GLUCAP" in the last 168 hours.  Radiological Exams on Admission: CT ANGIO GI BLEED Result Date: 06/28/2023 CLINICAL DATA:  Lower GI bleeding. Mesenteric ischemia, acute. Metastatic colon cancer EXAM: CTA ABDOMEN AND PELVIS WITHOUT AND WITH CONTRAST TECHNIQUE: Multidetector CT imaging of the abdomen and pelvis was performed using the standard protocol during bolus administration of intravenous contrast. Multiplanar reconstructed images and MIPs were obtained and reviewed to evaluate the vascular anatomy. RADIATION DOSE REDUCTION: This exam was performed according to the departmental dose-optimization program which includes automated exposure control, adjustment of the mA and/or kV according to patient size and/or use of iterative reconstruction technique. CONTRAST:  80mL OMNIPAQUE IOHEXOL 350 MG/ML SOLN COMPARISON:  06/04/2023 FINDINGS: VASCULAR Aorta: Normal caliber aorta without aneurysm, dissection, vasculitis or significant stenosis. Mild atherosclerosis. Celiac: Patent without evidence of aneurysm, dissection, vasculitis or significant stenosis. SMA: Patent without evidence of aneurysm, dissection, vasculitis or significant stenosis. Renals: The right renal artery is patent without evidence of aneurysm, dissection, vasculitis, fibromuscular dysplasia or  significant stenosis. Patient has had a left nephrectomy.  IMA: The origin and proximal aspect of the inferior mesenteric artery is not identified. This does appear to be reconstituted by branches of the superior mesenteric artery. Inflow: Patent without evidence of aneurysm, dissection, vasculitis or significant stenosis. Proximal Outflow: Bilateral common femoral and visualized portions of the superficial and profunda femoral arteries are patent without evidence of aneurysm, dissection, vasculitis or significant stenosis. Veins: Major venous structures are patent. Review of the MIP images confirms the above findings. NON-VASCULAR Lower chest: Small layering left pleural effusion. Hepatobiliary: 1.1 cm hypoattenuating lesion in the periphery of the right lower lobe, not definitively seen on the prior study (series 18, image 29. Previously described subtly hypoattenuating lesions within the liver at the gallbladder fossa and IVC are difficult to characterize but do not appear appreciably changed. Atrophy of the left hepatic lobe. Right hepatic lobe measures 22 cm in length. Small stone within the gallbladder. No biliary dilatation. Pancreas: Grossly unremarkable. Spleen: Grossly unchanged appearance of the spleen with multiple heterogeneously areas of hypoattenuation including dominant lesion measuring approximately 11.2 x 8.5 cm. 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 Adrenals/Urinary Tract: Thickened appearance of the adrenal glands. Prior left nephrectomy. Unremarkable right kidney. No stone or hydronephrosis. Unremarkable urinary bladder. Stomach/Bowel: Tiny hiatal hernia. Stomach otherwise within normal limits. No abnormally dilated loops of bowel. Postsurgical changes from prior sigmoid resection and reanastomosis abnormal colonic wall thickening in the region of the distal sigmoid colon (series 18, image 66). There is also focal colonic wall thickening just distal to the anastomotic site (series 18, image 57). Normal appendix in the  right lower quadrant (series 18, image 53). No evidence of abnormal contrast accumulation within the bowel on multiphasic imaging to localize site of active gastrointestinal bleeding. Lymphatic: Mildly prominent left external iliac chain lymph node measuring 9 mm (series 18, image 59), unchanged. No new or enlarging abdominopelvic lymph nodes. Reproductive: Prostatomegaly. Other: No ascites or pneumoperitoneum. Musculoskeletal: Extensive, widespread osseous metastatic disease, without evidence of significant progression compared to the prior study. Scoliotic spinal curvature. No pathologic fractures are seen. IMPRESSION: 1. The origin and proximal aspect of the inferior mesenteric artery is not identified. This does appear to be reconstituted by branches of the superior mesenteric artery. Otherwise, no significant vascular abnormality. 2. No evidence of abnormal contrast accumulation within the bowel to localize site of active gastrointestinal bleeding. 3. Postsurgical changes from prior sigmoid resection and reanastomosis. Abnormal colonic wall thickening in the region of the distal sigmoid colon and just distal to the anastomotic site. Findings may represent sites of focal colitis, however recurrent malignancy is not excluded. Follow-up colonoscopy should be considered following the resolution of patient's acute symptoms. 4. New 1.1 cm hypoattenuating lesion in the periphery of the right hepatic lobe, not definitively seen on the prior study. This is concerning for a new site of metastatic disease. 5. Extensive, widespread osseous metastatic disease, without evidence of significant progression compared to the prior study. 6. Small layering left pleural effusion. 7. Cholelithiasis. 8. Aortic atherosclerosis. Electronically Signed   By: Duanne Guess D.O.   On: 06/28/2023 11:18   Assessment/Plan [name] is a 58 y.o. male with medical history significant for colon cancer on FOLFOX/Avastin under the  care of [name], insulin-dependent type 2 diabetes, hypertension, hyperlipidemia who is also on Pradaxa due to history of DVT who is being admitted to the hospital with bright red blood per rectum and acute blood loss anemia.  Acute blood loss anemia-in the setting of bright red blood per rectum, possible AVM versus internal hemorrhoid versus diverticular bleed versus other.  He is hemodynamically stable, only minimally tachycardic. -Inpatient admission to progressive -Monitor hemoglobin every 8 hours -Avoid blood thinners, last received Pradaxa 10 PM 3/29.  Currently he is hemodynamically stable with normal blood pressure and no clear indication for reversal. -Note CT scan as above without evidence of active bleeding -Transfuse 2 units PRBC, and monitor hemoglobin every 8 hours as above -ER provider has consulted GI Dr. Bosie Clos -Will keep on a clear liquid diet in the meantime  Insulin-dependent type 2 diabetes -Resume his home basal insulin at half dose, with moderate dose sliding scale  Metastatic colon cancer-under the care of [name], currently receiving FOLFOX/Avastin -[name] added to inpatient treatment team -Continue home MS Contin, and as needed Dilaudid p.o.  Hyperlipidemia-Lipitor  Hypertension-will continue patient's home Toprol-XL, which he has not received today and is likely contributing to his tachycardia  DVT prophylaxis: SCDs only due to GI bleed    Code Status: Full Code  Consults called: Gastroenterology Dr. Bosie Clos  Admission status: The appropriate patient status for this patient is INPATIENT. Inpatient status is judged to be reasonable and necessary in order to provide the required intensity of service to ensure the patient's safety. The patient's presenting symptoms, physical exam findings, and initial radiographic and laboratory data in the context of their chronic comorbidities is felt to place them at high risk for further clinical  deterioration. Furthermore, it is not anticipated that the patient will be medically stable for discharge from the hospital within 2 midnights of admission.    I certify that at the point of admission it is my clinical judgment that the patient will require inpatient hospital care spanning beyond 2 midnights from the point of admission due to high intensity of service, high risk for further deterioration and high frequency of surveillance required  Time spent: 56 minutes  Ringo Sherod Sharlette Dense MD [facility] Pager [phone]  If 7PM-7AM, please contact night-coverage [email or web address] Password [facility]  06/28/2023, 12:29 PM

## 2023-06-28 NOTE — Plan of Care (Signed)

## 2023-06-28 NOTE — H&P (View-Only) (Signed)
 Referring Provider: Dr. Freida Busman Primary Care Physician:  Joycelyn Rua, MD Primary Gastroenterologist:  Dr. Bosie Clos  Reason for Consultation:  GI Bleed  HPI: [name] is a 58 y.o. 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 Patient had the acute onset of rectal bleeding late last night with black loose stools followed by maroon colored blood that filled the toilet bowl (cell phone pics shown by wife). Last episode occurred at 830 AM at home. Felt very dizzy and lightheaded. Small amount of blood seen on bed pad by nursing. Denies associated abdominal pain, N/V. Tachycardic and hypertensive on presentation. 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 Hgb 6.2 on 3/25 and given 2 U PRBCs as outpt. Hgb 6.5 on admit today. (7.4 on 06/10/23). CT angiogram NEGATIVE for active bleeding but showed abnormal colonic wall thickening distal to colocolonic anastomosis in the distal sigmoid colon. Transverse colon mass diagnosed on screening colon in 2020 with subsequent left hemicolectomy. Last colonoscopy in 12/2019 where 6 adenomas were removed and showed internal hemorrhoids and repeat planned for 2023 but pt reports a repeat was not recommended by oncology. Takes 6-8 Advil per day for years for bone pain. Denies alcohol. Nurse and wife in room.  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   History of hiatal hernia    Hyperlipidemia    Hypertension    Diagnostic exercise tolerance test assessment:04/30/2010 : comments normal -no evidence os ischemia by ST analysis   lipoma    Lipoma 01/26/2019   Mitral regurgitation    Echo 12/2018: EF 60-65, myxomatous mitral valve, severe mitral regurgitation, partial flail leaflet of posterior mitral valve, myxomatous tricuspid valve with trivial TR, ascending aorta mildly dilated (39 mm)   Pneumonia    as child   PONV (postoperative nausea and vomiting)    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   Complex valvuloplasty including artificial Gore-tex neochord placement x6 with 28 mm Sorin Memo 4D ring annuloplasty via right mini thoracotomy approach   Solitary kidney    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    Past Surgical History:  Procedure Laterality Date   ABDOMINAL AORTOGRAM N/A 02/10/2020   Procedure: ABDOMINAL AORTOGRAM;  Surgeon: Corky Crafts, MD;  [facility] INVASIVE CV LAB;  Service: Cardiovascular;  Laterality: N/A;   APPLICATION OF WOUND VAC  08/15/2021   Procedure: APPLICATION OF WOUND VAC;  Surgeon: Emelia Loron, MD;  Location: [facility] OR;  Service: General;;   BIOPSY  01/07/2019   Procedure: BIOPSY;  Surgeon: Charlott Rakes, MD;  Location: [facility] ENDOSCOPY;  Service: Endoscopy;;   BIOPSY  01/03/2020   Procedure: BIOPSY;  Surgeon: Charlott Rakes, MD;  Location: [facility] ENDOSCOPY;  Service: Endoscopy;;  COLON SURGERY  03/04/2019   left colon segmental resection   COLONOSCOPY WITH PROPOFOL N/A 01/07/2019   Procedure: COLONOSCOPY WITH PROPOFOL;  Surgeon: Charlott Rakes, MD;  Location: [facility] ENDOSCOPY;  Service: Endoscopy;  Laterality: N/A;   COLONOSCOPY WITH PROPOFOL N/A 01/03/2020   Procedure: COLONOSCOPY WITH PROPOFOL;  Surgeon: Charlott Rakes, MD;  Location: [facility] ENDOSCOPY;  Service: Endoscopy;  Laterality: N/A;   INCISION AND DRAINAGE OF WOUND Right  08/13/2021   Procedure: IRRIGATION AND DEBRIDEMENT WOUND RIGHT GROIN;  Surgeon: Axel Filler, MD;  Location: [facility] OR;  Service: General;  Laterality: Right;   INGUINAL HERNIA REPAIR Left 04/23/2016   Procedure: LAPAROSCOPIC  INGUINAL REPAIR;  Surgeon: Berna Bue, MD;  Location: [facility] OR;  Service: General;  Laterality: Left;   IR IMAGING GUIDED PORT INSERTION  05/08/2022   IRRIGATION AND DEBRIDEMENT ABSCESS Right 08/15/2021   Procedure: IRRIGATION AND DEBRIDEMENT OF GROIN;  Surgeon: Emelia Loron, MD;  Location: [facility] OR;  Service: General;  Laterality: Right;   IRRIGATION AND DEBRIDEMENT SEBACEOUS CYST N/A 03/19/2023   Procedure: DEBRIDEMENT OF CHRONIC INFECTION;  Surgeon: Romie Levee, MD;  Location: [facility];  Service: General;  Laterality: N/A;   KNEE SURGERY     arthroscopic left knee   LIPOMA EXCISION  2015   x20 [facility]   MITRAL VALVE REPAIR Right 05/09/2020   Procedure: MINIMALLY INVASIVE MITRAL VALVE REPAIR (MVR) USING LIVANOVA MEMO 4D MITRAL RING;  Surgeon: Purcell Nails, MD;  Location: [facility] OR;  Service: Open Heart Surgery;  Laterality: Right;   POLYPECTOMY  01/03/2020   Procedure: POLYPECTOMY;  Surgeon: Charlott Rakes, MD;  Location: [facility] ENDOSCOPY;  Service: Endoscopy;;   PORT-A-CATH REMOVAL     PORTACATH PLACEMENT N/A 03/16/2019   Procedure: INSERTION PORT-A-CATH;  Surgeon: Karie Soda, MD;  Location: [facility] ORS;  Service: General;  Laterality: N/A;   RIGHT/LEFT HEART CATH AND CORONARY ANGIOGRAPHY N/A 02/10/2020   Procedure: RIGHT/LEFT HEART CATH AND CORONARY ANGIOGRAPHY;  Surgeon: Corky Crafts, MD;  Location: [facility] INVASIVE CV LAB;  Service: Cardiovascular;  Laterality: N/A;   SUBMUCOSAL INJECTION  01/07/2019   Procedure: SUBMUCOSAL INJECTION;  Surgeon: Charlott Rakes, MD;  Location: [facility] ENDOSCOPY;  Service: Endoscopy;;   TEE WITHOUT CARDIOVERSION N/A 02/10/2020   Procedure: TRANSESOPHAGEAL ECHOCARDIOGRAM (TEE);  Surgeon: Elease Hashimoto Deloris Ping, MD;   Location: [facility] ENDOSCOPY;  Service: Cardiovascular;  Laterality: N/A;  CATH AFTER TEE   TEE WITHOUT CARDIOVERSION N/A 05/09/2020   Procedure: TRANSESOPHAGEAL ECHOCARDIOGRAM (TEE);  Surgeon: Purcell Nails, MD;  Location: [facility] OR;  Service: Open Heart Surgery;  Laterality: N/A;   THYROIDECTOMY, PARTIAL  01/05/2009   Right Thyroid Lobectomy   TONSILLECTOMY     TOTAL NEPHRECTOMY Left 1970   Wilms Tumor left kidney excised as an infant    Prior to Admission medications   Medication Sig Start Date End Date Taking? Authorizing Provider  acetaminophen (TYLENOL) 500 MG tablet Take 1 tablet (500 mg total) by mouth daily as needed for mild pain. 12/29/21   Redwine, Madison A, PA-C  albuterol (PROVENTIL HFA;VENTOLIN HFA) 108 (90 BASE) MCG/ACT inhaler Inhale 1-2 puffs into the lungs every 6 (six) hours as needed for wheezing or shortness of breath.    [provider]  aspirin EC 81 MG EC tablet Take 1 tablet (81 mg total) by mouth daily. Swallow whole. 05/15/20   Leary Roca, PA-C  atorvastatin (LIPITOR) 10 MG tablet Take 1 tablet (10 mg total) by mouth daily. 03/15/14   Angiulli, Mcarthur Rossetti,  PA-C  calcium carbonate (TUMS EX) 750 MG chewable tablet Chew 1,500 mg by mouth daily as needed for heartburn.    [provider]  dabigatran (PRADAXA) 150 MG CAPS capsule Take 150 mg by mouth 2 (two) times daily.    [provider]  dexamethasone (DECADRON) 4 MG tablet Take 1 tablet (4 mg total) by mouth daily. Start on 05/15/2023 05/06/23   Ladene Artist, MD  HYDROmorphone (DILAUDID) 4 MG tablet Take 1 tablet (4 mg total) by mouth every 4 (four) hours as needed for severe pain (pain score 7-10). 06/10/23   Rana Snare, NP  ibuprofen (ADVIL) 200 MG tablet Take 200 mg by mouth daily as needed for mild pain.    [provider]  insulin glargine, 1 Unit Dial, (TOUJEO SOLOSTAR) 300 UNIT/ML Solostar Pen Inject 41 Units into the skin daily. 04/05/21   [provider]   lidocaine-prilocaine (EMLA) cream Apply 1 Application topically as needed (port irritation).    [provider]  lisinopril (ZESTRIL) 5 MG tablet Take 5 mg by mouth daily. 05/14/23   [provider]  loratadine (CLARITIN) 10 MG tablet Take 10 mg by mouth daily as needed for allergies.    [provider]  metFORMIN (GLUCOPHAGE) 1000 MG tablet Take 1 tablet (1,000 mg total) by mouth 2 (two) times daily. 02/12/20   Corky Crafts, MD  metoprolol succinate (TOPROL-XL) 25 MG 24 hr tablet Take 25 mg by mouth daily.    [provider]  mometasone (ASMANEX) 220 MCG/INH inhaler Inhale 1 puff into the lungs daily.    [provider]  morphine (MS CONTIN) 30 MG 12 hr tablet Take 1 tablet (30 mg total) by mouth every 12 (twelve) hours. 06/10/23   Rana Snare, NP  naloxone [facility]) nasal spray 4 mg/0.1 mL For use if Opoid overdose is suspected Patient taking differently: Place 1 spray into the nose once. For use if Opoid overdose is suspected 05/28/23   Briant Cedar, MD  Omega-3 Fatty Acids (FISH OIL PO) Take 1,000 mg by mouth 2 (two) times daily.    [provider]  ONE TOUCH ULTRA TEST test strip 1 each by Other route as needed (GLUCOSE).  05/06/15   [provider]  pioglitazone (ACTOS) 30 MG tablet Take 30 mg by mouth daily.    [provider]  polyethylene glycol powder (GLYCOLAX/MIRALAX) 17 GM/SCOOP powder Dissolve 17 g in liquid and take by mouth daily. 05/28/23   Briant Cedar, MD  prochlorperazine (COMPAZINE) 10 MG tablet Take 1 tablet (10 mg total) by mouth every 6 (six) hours as needed. 06/10/23   Rana Snare, NP  repaglinide (PRANDIN) 2 MG tablet Take 2 mg by mouth 2 (two) times daily. 05/16/20   [provider]  senna-docusate (SENOKOT-S) 8.6-50 MG tablet Take 1 tablet by mouth at bedtime. 05/28/23   Briant Cedar, MD  traZODone (DESYREL) 50 MG tablet Take 1 tablet (50 mg total) by mouth at  bedtime as needed for sleep. Patient not taking: Reported on 06/17/2023 06/06/23 07/06/23  [name], MD    Scheduled Meds:  sodium chloride   Intravenous Once   atorvastatin  10 mg Oral Daily   insulin aspart  0-15 Units Subcutaneous Q4H   [START ON 06/29/2023] insulin glargine-yfgn  20 Units Subcutaneous Daily   metoprolol succinate  25 mg Oral Daily   morphine  30 mg Oral Q12H   Continuous Infusions:  sodium chloride 125 mL/hr  at 06/28/23 1015   PRN Meds:.acetaminophen **OR** acetaminophen, albuterol, HYDROmorphone (DILAUDID) injection, HYDROmorphone, ondansetron **OR** ondansetron (ZOFRAN) IV  Allergies as of 06/28/2023 - Review Complete 06/28/2023  Allergen Reaction Noted   Cat dander Anaphylaxis 02/04/2023   Empagliflozin Other (See Comments) 05/17/2020    Family History  Problem Relation Age of Onset   Heart disease Father    Hernia Father    Healthy Sister    Stroke Paternal Grandmother    Cancer Mother        THYROID   Hypotension Mother    Heart attack Maternal Grandfather    Congestive Heart Failure Maternal Grandfather    Heart attack Paternal Grandfather    Colon cancer Maternal Aunt 79   Congestive Heart Failure Paternal Uncle    Congestive Heart Failure Maternal Grandmother    Colon cancer Other 40       MGMs brother   Colon cancer Other        MGFs brother    Social History   Socioeconomic History   Marital status: Married    Spouse name: Not on file   Number of children: Not on file   Years of education: Not on file   Highest education level: Not on file  Occupational History   Occupation: executive  Tobacco Use   Smoking status: Never   Smokeless tobacco: Never  Vaping Use   Vaping status: Never Used  Substance and Sexual Activity   Alcohol use: Never    Alcohol/week: 1.0 standard drink of alcohol    Types: 1 Glasses of wine per week    Comment: occasional   Drug use: Never   Sexual activity: Not Currently  Other Topics Concern    Not on file  Social History Narrative   Never smoked Alcohol yes, rare ,1-2 per [employer] 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 Right   Are you currently employed ?    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   Who lives with you? Wife and son   What type of home do you live in: 1 story or 2 story? two   Caffiene occ.    Social Drivers of Corporate investment banker Strain: Not on file  Food Insecurity: No Food Insecurity (06/03/2023)   Hunger Vital Sign    Worried About Running Out of Food in the Last Year: Never true    Ran Out of Food in the Last Year: Never true  Transportation Needs: No Transportation Needs (06/03/2023)   PRAPARE - Administrator, Civil Service (Medical): No    Lack of Transportation (Non-Medical): No  Physical Activity: Not on file  Stress: Not on file  Social Connections: Socially Integrated (06/03/2023)   Social Connection and Isolation Panel [NHANES]    Frequency of Communication with Friends and Family: More than three times a week    Frequency of Social Gatherings with Friends and Family: Once a week    Attends Religious Services: More than 4 times per year    Active Member of [employer] or Organizations: Yes    Attends Banker Meetings: 1 to 4 times per year    Marital Status: Married  Catering manager Violence: Not At Risk (06/03/2023)   Humiliation, Afraid, Rape, and Kick questionnaire    Fear of Current or Ex-Partner: No  Emotionally Abused: No    Physically Abused: No    Sexually Abused: No    Review of Systems: All negative except as stated above in HPI.  Physical Exam: Vital signs: Vitals:   06/28/23 1300 06/28/23 1347  BP:  129/86  Pulse: (!) 111 (!) 103  Resp: 14 17  Temp:  98.1 F (36.7 C)  SpO2: 100% 100%     General:  Pale, lethargic, mild acute  distress, well-nourished, pleasant  Head: normocephalic, atraumatic Eyes: anicteric sclera ENT: oropharynx clear Neck: supple, nontender Lungs:  Clear throughout to auscultation.   No wheezes, crackles, or rhonchi. No acute distress. Heart:  Regular rate and rhythm; no murmurs, clicks, rubs,  or gallops. Abdomen: diffuse tenderness with guarding, soft, nondistended, +BS  Rectal:  Deferred Ext: no edema  GI:  Lab Results: Recent Labs    06/28/23 1000 06/28/23 1008  WBC 4.7  --   HGB 6.5* 6.5*  HCT 21.0* 19.0*  PLT 74*  --    BMET Recent Labs    06/28/23 1000 06/28/23 1008  NA 129* 130*  K 5.0 4.8  CL 99 99  CO2 20*  --   GLUCOSE 251* 242*  BUN 24* 21*  CREATININE 0.62 0.60*  CALCIUM 7.6*  --    LFT Recent Labs    06/28/23 1000  PROT 5.5*  ALBUMIN 2.0*  AST 41  ALT 18  ALKPHOS 676*  BILITOT 0.9   PT/INR No results for input(s): "LABPROT", "INR" in the last 72 hours.   Studies/Results:   Impression/Plan: GI bleeding in the setting of Pradaxa (last dose 3/29 at 10pm) and metastatic colon cancer to bone (last colonoscopy 2021). CT angiogram NEG for active GI bleeding but shows abnormal colonic wall thickening distal to anastomosis in distal sigmoid colon. Stools were black initially and then looked maroon. Abdominal pain on exam. No diverticulosis seen on last colonoscopy. Recurrence of colonic malignancy possible as is ischemic colitis. Supportive care. Will need an updated colonoscopy (or unprepped flex sig to assess the anastomosis) and possibly an EGD in the next 1-2 days. Hold Pradaxa. Clear liquid diet. NPO p MN in case EGD and unprepped flex sig needed tomorrow. Supportive care. Dr. Dulce Sellar to re-evaluate tomorrow.   LOS: 0 days   Shirley Friar  06/28/2023, 2:08 PM  Questions please call [phone]

## 2023-06-28 NOTE — ED Notes (Addendum)
 I made MD Freida Busman, and the RN aware of critical IStat Chem 8 results

## 2023-06-28 NOTE — ED Triage Notes (Signed)
 BIB EMS chief complain GI bleed, pt stated they has bouts of lower GI bleed, reports dizziness and weakness standing up   Hx Colon cancer, Bone cancer  Hr 118 Bp 130/60  Current med Pradaxa

## 2023-06-28 NOTE — ED Provider Notes (Signed)
 [name] Provider Note   CSN: [id number] Arrival date & time: 06/28/23  4098     History  No chief complaint on file.   [name] is a 58 y.o. male.  58 year old male with history of metastatic colon cancer to the bone on Pradaxa who presents with lower GI bleeding since today.  Patient has had a copious amount of melanotic stools.  Denies any prior history of this.  He has had no abdominal pain.  He has not been vomiting.  No fever or chills.  Gets dizzy when he tries to stand up.  Called EMS and I spoke to them when he arrived.  Patient given IV fluids and transported here       Home Medications Prior to Admission medications   Medication Sig Start Date End Date Taking? Authorizing Provider  acetaminophen (TYLENOL) 500 MG tablet Take 1 tablet (500 mg total) by mouth daily as needed for mild pain. 12/29/21   Redwine, Madison A, PA-C  albuterol (PROVENTIL HFA;VENTOLIN HFA) 108 (90 BASE) MCG/ACT inhaler Inhale 1-2 puffs into the lungs every 6 (six) hours as needed for wheezing or shortness of breath.    [provider]  aspirin EC 81 MG EC tablet Take 1 tablet (81 mg total) by mouth daily. Swallow whole. 05/15/20   Leary Roca, PA-C  atorvastatin (LIPITOR) 10 MG tablet Take 1 tablet (10 mg total) by mouth daily. 03/15/14   Angiulli, Mcarthur Rossetti, PA-C  calcium carbonate (TUMS EX) 750 MG chewable tablet Chew 1,500 mg by mouth daily as needed for heartburn.    [provider]  dabigatran (PRADAXA) 150 MG CAPS capsule Take 150 mg by mouth 2 (two) times daily.    [provider]  dexamethasone (DECADRON) 4 MG tablet Take 1 tablet (4 mg total) by mouth daily. Start on 05/15/2023 05/06/23   Ladene Artist, MD  HYDROmorphone (DILAUDID) 4 MG tablet Take 1 tablet (4 mg total) by mouth every 4 (four) hours as needed for severe pain (pain score 7-10). 06/10/23   Rana Snare, NP  ibuprofen (ADVIL) 200 MG tablet  Take 200 mg by mouth daily as needed for mild pain.    [provider]  insulin glargine, 1 Unit Dial, (TOUJEO SOLOSTAR) 300 UNIT/ML Solostar Pen Inject 41 Units into the skin daily. 04/05/21   [provider]  lidocaine-prilocaine (EMLA) cream Apply 1 Application topically as needed (port irritation).    [provider]  lisinopril (ZESTRIL) 5 MG tablet Take 5 mg by mouth daily. 05/14/23   [provider]  loratadine (CLARITIN) 10 MG tablet Take 10 mg by mouth daily as needed for allergies.    [provider]  metFORMIN (GLUCOPHAGE) 1000 MG tablet Take 1 tablet (1,000 mg total) by mouth 2 (two) times daily. 02/12/20   Corky Crafts, MD  metoprolol succinate (TOPROL-XL) 25 MG 24 hr tablet Take 25 mg by mouth daily.    [provider]  mometasone (ASMANEX) 220 MCG/INH inhaler Inhale 1 puff into the lungs daily.    [provider]  morphine (MS CONTIN) 30 MG 12 hr tablet Take 1 tablet (30 mg total) by mouth every 12 (twelve) hours. 06/10/23   Rana Snare, NP  naloxone [facility]) nasal spray 4 mg/0.1 mL For use if Opoid overdose is suspected Patient taking differently: Place 1 spray into the nose once. For use if Opoid overdose is suspected 05/28/23   Andreas Newport  J, MD  Omega-3 Fatty Acids (FISH OIL PO) Take 1,000 mg by mouth 2 (two) times daily.    [provider]  ONE TOUCH ULTRA TEST test strip 1 each by Other route as needed (GLUCOSE).  05/06/15   [provider]  pioglitazone (ACTOS) 30 MG tablet Take 30 mg by mouth daily.    [provider]  polyethylene glycol powder (GLYCOLAX/MIRALAX) 17 GM/SCOOP powder Dissolve 17 g in liquid and take by mouth daily. 05/28/23   Briant Cedar, MD  prochlorperazine (COMPAZINE) 10 MG tablet Take 1 tablet (10 mg total) by mouth every 6 (six) hours as needed. 06/10/23   Rana Snare, NP  repaglinide (PRANDIN) 2 MG tablet Take 2 mg by mouth 2 (two) times daily.  05/16/20   [provider]  senna-docusate (SENOKOT-S) 8.6-50 MG tablet Take 1 tablet by mouth at bedtime. 05/28/23   Briant Cedar, MD  traZODone (DESYREL) 50 MG tablet Take 1 tablet (50 mg total) by mouth at bedtime as needed for sleep. Patient not taking: Reported on 06/17/2023 06/06/23 07/06/23  [name], MD      Allergies    Cat dander and Empagliflozin    Review of Systems   Review of Systems  All other systems reviewed and are negative.   Physical Exam Updated Vital Signs There were no vitals taken for this visit. Physical Exam Vitals and nursing note reviewed. Exam conducted with a chaperone present.  Constitutional:      General: He is not in acute distress.    Appearance: Normal appearance. He is well-developed. He is not toxic-appearing.  HENT:     Head: Normocephalic and atraumatic.  Eyes:     General: Lids are normal.     Conjunctiva/sclera: Conjunctivae normal.     Pupils: Pupils are equal, round, and reactive to light.  Neck:     Thyroid: No thyroid mass.     Trachea: No tracheal deviation.  Cardiovascular:     Rate and Rhythm: Normal rate and regular rhythm.     Heart sounds: Normal heart sounds. No murmur heard.    No gallop.  Pulmonary:     Effort: Pulmonary effort is normal. No respiratory distress.     Breath sounds: Normal breath sounds. No stridor. No decreased breath sounds, wheezing, rhonchi or rales.  Abdominal:     General: There is no distension.     Palpations: Abdomen is soft.     Tenderness: There is no abdominal tenderness. There is no rebound.  Genitourinary:    Comments: Gross blood noted per rectum Musculoskeletal:        General: No tenderness. Normal range of motion.     Cervical back: Normal range of motion and neck supple.  Skin:    General: Skin is warm and dry.     Findings: No abrasion or rash.  Neurological:     Mental Status: He is alert and oriented to person, place, and time. Mental status is at baseline.      GCS: GCS eye subscore is 4. GCS verbal subscore is 5. GCS motor subscore is 6.     Cranial Nerves: No cranial nerve deficit.     Sensory: No sensory deficit.     Motor: Motor function is intact.  Psychiatric:        Attention and Perception: Attention normal.        Speech: Speech normal.        Behavior: Behavior normal.  ED Results / Procedures / Treatments   Labs (all labs ordered are listed, but only abnormal results are displayed) Labs Reviewed  CBC WITH DIFFERENTIAL/PLATELET  COMPREHENSIVE METABOLIC PANEL WITH GFR  I-STAT CHEM 8, ED  TYPE AND SCREEN    EKG None  Radiology No results found.  Procedures Procedures    Medications Ordered in ED Medications  sodium chloride 0.9 % bolus 2,000 mL (has no administration in time range)  0.9 %  sodium chloride infusion (has no administration in time range)  morphine (PF) 4 MG/ML injection 8 mg (has no administration in time range)    ED Course/ Medical Decision Making/ A&P                                 Medical Decision Making Amount and/or Complexity of Data Reviewed Labs: ordered. Radiology: ordered.  Risk Prescription drug management.   Patient given IV fluids and pain medication here.  Patient with lower GI bleeding on Pradaxa.  Consider reversing it but patient has remained hemodynamically stable.  Patient's bedside hemoglobin came back at 6.5.  2 units of packed red cells ordered.  CT angio done to look for vascular cause of his bleeding and that did not show any acute findings related to that.  Secure chat message sent to Dr. Loreta Ave on-call for GI.  Will admit to the hospitalist team.  Wife at bedside and informed of plan and is agreeable  CRITICAL CARE Performed by: Toy Baker Total critical care time: 45 minutes Critical care time was exclusive of separately billable procedures and treating other patients. Critical care was necessary to treat or prevent imminent or life-threatening  deterioration. Critical care was time spent personally by me on the following activities: development of treatment plan with patient and/or surrogate as well as nursing, discussions with consultants, evaluation of patient's response to treatment, examination of patient, obtaining history from patient or surrogate, ordering and performing treatments and interventions, ordering and review of laboratory studies, ordering and review of radiographic studies, pulse oximetry and re-evaluation of patient's condition.         Final Clinical Impression(s) / ED Diagnoses Final diagnoses:  None    Rx / DC Orders ED Discharge Orders     None         Lorre Nick, MD 06/28/23 1155

## 2023-06-28 NOTE — Consult Note (Signed)
 WOC Nurse Consult Note: Reason for Consult: wife requests wound care to buttocks  Wound type: full thickness r/t contact dermatitis  Pressure Injury POA: NA  Measurement: see nursing flow sheet  Wound bed: 5 scattered full thickness wounds noted to bilateral buttocks largely pink and moist  Drainage (amount, consistency, odor)  per flowsheet  Periwound: intact  Dressing procedure/placement/frequency: Cleanse buttocks (intact skin and ulcerations) with Vashe wound cleanser [facility] [place] [phone]), do not rinse and allow to air dry.  Apply Xeroform gauze to wound beds daily and cover with silicone foam or ABD pad and tape.   POC discussed with bedside nurse. WOC team will not follow. Re-consult if further needs arise.   Thank you,    Priscella Mann MSN, RN-BC, [employer] [phone]

## 2023-06-28 NOTE — Consult Note (Addendum)
 Referring Provider: Dr. Freida Busman Primary Care Physician:  Joycelyn Rua, MD Primary Gastroenterologist:  Dr. Bosie Clos  Reason for Consultation:  GI Bleed  HPI: [name] is a 58 y.o. 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 Patient had the acute onset of rectal bleeding late last night with black loose stools followed by maroon colored blood that filled the toilet bowl (cell phone pics shown by wife). Last episode occurred at 830 AM at home. Felt very dizzy and lightheaded. Small amount of blood seen on bed pad by nursing. Denies associated abdominal pain, N/V. Tachycardic and hypertensive on presentation. 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 Hgb 6.2 on 3/25 and given 2 U PRBCs as outpt. Hgb 6.5 on admit today. (7.4 on 06/10/23). CT angiogram NEGATIVE for active bleeding but showed abnormal colonic wall thickening distal to colocolonic anastomosis in the distal sigmoid colon. Transverse colon mass diagnosed on screening colon in 2020 with subsequent left hemicolectomy. Last colonoscopy in 12/2019 where 6 adenomas were removed and showed internal hemorrhoids and repeat planned for 2023 but pt reports a repeat was not recommended by oncology. Takes 6-8 Advil per day for years for bone pain. Denies alcohol. Nurse and wife in room.  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   History of hiatal hernia    Hyperlipidemia    Hypertension    Diagnostic exercise tolerance test assessment:04/30/2010 : comments normal -no evidence os ischemia by ST analysis   lipoma    Lipoma 01/26/2019   Mitral regurgitation    Echo 12/2018: EF 60-65, myxomatous mitral valve, severe mitral regurgitation, partial flail leaflet of posterior mitral valve, myxomatous tricuspid valve with trivial TR, ascending aorta mildly dilated (39 mm)   Pneumonia    as child   PONV (postoperative nausea and vomiting)    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   Complex valvuloplasty including artificial Gore-tex neochord placement x6 with 28 mm Sorin Memo 4D ring annuloplasty via right mini thoracotomy approach   Solitary kidney    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    Past Surgical History:  Procedure Laterality Date   ABDOMINAL AORTOGRAM N/A 02/10/2020   Procedure: ABDOMINAL AORTOGRAM;  Surgeon: Corky Crafts, MD;  [facility] INVASIVE CV LAB;  Service: Cardiovascular;  Laterality: N/A;   APPLICATION OF WOUND VAC  08/15/2021   Procedure: APPLICATION OF WOUND VAC;  Surgeon: Emelia Loron, MD;  Location: [facility] OR;  Service: General;;   BIOPSY  01/07/2019   Procedure: BIOPSY;  Surgeon: Charlott Rakes, MD;  Location: [facility] ENDOSCOPY;  Service: Endoscopy;;   BIOPSY  01/03/2020   Procedure: BIOPSY;  Surgeon: Charlott Rakes, MD;  Location: [facility] ENDOSCOPY;  Service: Endoscopy;;  COLON SURGERY  03/04/2019   left colon segmental resection   COLONOSCOPY WITH PROPOFOL N/A 01/07/2019   Procedure: COLONOSCOPY WITH PROPOFOL;  Surgeon: Charlott Rakes, MD;  Location: [facility] ENDOSCOPY;  Service: Endoscopy;  Laterality: N/A;   COLONOSCOPY WITH PROPOFOL N/A 01/03/2020   Procedure: COLONOSCOPY WITH PROPOFOL;  Surgeon: Charlott Rakes, MD;  Location: [facility] ENDOSCOPY;  Service: Endoscopy;  Laterality: N/A;   INCISION AND DRAINAGE OF WOUND Right  08/13/2021   Procedure: IRRIGATION AND DEBRIDEMENT WOUND RIGHT GROIN;  Surgeon: Axel Filler, MD;  Location: [facility] OR;  Service: General;  Laterality: Right;   INGUINAL HERNIA REPAIR Left 04/23/2016   Procedure: LAPAROSCOPIC  INGUINAL REPAIR;  Surgeon: Berna Bue, MD;  Location: [facility] OR;  Service: General;  Laterality: Left;   IR IMAGING GUIDED PORT INSERTION  05/08/2022   IRRIGATION AND DEBRIDEMENT ABSCESS Right 08/15/2021   Procedure: IRRIGATION AND DEBRIDEMENT OF GROIN;  Surgeon: Emelia Loron, MD;  Location[facility] OR;  Service: General;  Laterality: Right;   IRRIGATION AND DEBRIDEMENT SEBACEOUS CYST N/A 03/19/2023   Procedure: DEBRIDEMENT OF CHRONIC INFECTION;  Surgeon: Romie Levee, MD;  Location: [facility];  Service: General;  Laterality: N/A;   KNEE SURGERY     arthroscopic left knee   LIPOMA EXCISION  2015   x20 [facility]   MITRAL VALVE REPAIR Right 05/09/2020   Procedure: MINIMALLY INVASIVE MITRAL VALVE REPAIR (MVR) USING LIVANOVA MEMO 4D MITRAL RING;  Surgeon: Purcell Nails, MD;  Location: [facility] OR;  Service: Open Heart Surgery;  Laterality: Right;   POLYPECTOMY  01/03/2020   Procedure: POLYPECTOMY;  Surgeon: Charlott Rakes, MD;  Location: [facility]OSCOPY;  Service: Endoscopy;;   PORT-A-CATH REMOVAL     PORTACATH PLACEMENT N/A 03/16/2019   Procedure: INSERTION PORT-A-CATH;  Surgeon: Karie Soda, MD;  Location: [facility] ORS;  Service: General;  Laterality: N/A;   RIGHT/LEFT HEART CATH AND CORONARY ANGIOGRAPHY N/A 02/10/2020   Procedure: RIGHT/LEFT HEART CATH AND CORONARY ANGIOGRAPHY;  Surgeon: Corky Crafts, MD;  Location: [facility] INVASIVE CV LAB;  Service: Cardiovascular;  Laterality: N/A;   SUBMUCOSAL INJECTION  01/07/2019   Procedure: SUBMUCOSAL INJECTION;  Surgeon: Charlott Rakes, MD;  Location: [facility] ENDOSCOPY;  Service: Endoscopy;;   TEE WITHOUT CARDIOVERSION N/A 02/10/2020   Procedure: TRANSESOPHAGEAL ECHOCARDIOGRAM (TEE);  Surgeon: Elease Hashimoto Deloris Ping, MD;   Location: [facility] ENDOSCOPY;  Service: Cardiovascular;  Laterality: N/A;  CATH AFTER TEE   TEE WITHOUT CARDIOVERSION N/A 05/09/2020   Procedure: TRANSESOPHAGEAL ECHOCARDIOGRAM (TEE);  Surgeon: Purcell Nails, MD;  Location: [facility] OR;  Service: Open Heart Surgery;  Laterality: N/A;   THYROIDECTOMY, PARTIAL  01/05/2009   Right Thyroid Lobectomy   TONSILLECTOMY     TOTAL NEPHRECTOMY Left 1970   Wilms Tumor left kidney excised as an infant    Prior to Admission medications   Medication Sig Start Date End Date Taking? Authorizing Provider  acetaminophen (TYLENOL) 500 MG tablet Take 1 tablet (500 mg total) by mouth daily as needed for mild pain. 12/29/21   Redwine, Madison A, PA-C  albuterol (PROVENTIL HFA;VENTOLIN HFA) 108 (90 BASE) MCG/ACT inhaler Inhale 1-2 puffs into the lungs every 6 (six) hours as needed for wheezing or shortness of breath.    [provider]  aspirin EC 81 MG EC tablet Take 1 tablet (81 mg total) by mouth daily. Swallow whole. 05/15/20   Leary Roca, PA-C  atorvastatin (LIPITOR) 10 MG tablet Take 1 tablet (10 mg total) by mouth daily. 03/15/14   Angiulli, Mcarthur Rossetti,  PA-C  calcium carbonate (TUMS EX) 750 MG chewable tablet Chew 1,500 mg by mouth daily as needed for heartburn.    [provider]  dabigatran (PRADAXA) 150 MG CAPS capsule Take 150 mg by mouth 2 (two) times daily.    [provider]  dexamethasone (DECADRON) 4 MG tablet Take 1 tablet (4 mg total) by mouth daily. Start on 05/15/2023 05/06/23   Ladene Artist, MD  HYDROmorphone (DILAUDID) 4 MG tablet Take 1 tablet (4 mg total) by mouth every 4 (four) hours as needed for severe pain (pain score 7-10). 06/10/23   Rana Snare, NP  ibuprofen (ADVIL) 200 MG tablet Take 200 mg by mouth daily as needed for mild pain.    [provider]  insulin glargine, 1 Unit Dial, (TOUJEO SOLOSTAR) 300 UNIT/ML Solostar Pen Inject 41 Units into the skin daily. 04/05/21   [provider]   lidocaine-prilocaine (EMLA) cream Apply 1 Application topically as needed (port irritation).    [provider]  lisinopril (ZESTRIL) 5 MG tablet Take 5 mg by mouth daily. 05/14/23   [provider]  loratadine (CLARITIN) 10 MG tablet Take 10 mg by mouth daily as needed for allergies.    [provider]  metFORMIN (GLUCOPHAGE) 1000 MG tablet Take 1 tablet (1,000 mg total) by mouth 2 (two) times daily. 02/12/20   Corky Crafts, MD  metoprolol succinate (TOPROL-XL) 25 MG 24 hr tablet Take 25 mg by mouth daily.    [provider]  mometasone (ASMANEX) 220 MCG/INH inhaler Inhale 1 puff into the lungs daily.    [provider]  morphine (MS CONTIN) 30 MG 12 hr tablet Take 1 tablet (30 mg total) by mouth every 12 (twelve) hours. 06/10/23   Rana Snare, NP  naloxone [facility]) nasal spray 4 mg/0.1 mL For use if Opoid overdose is suspected Patient taking differently: Place 1 spray into the nose once. For use if Opoid overdose is suspected 05/28/23   Briant Cedar, MD  Omega-3 Fatty Acids (FISH OIL PO) Take 1,000 mg by mouth 2 (two) times daily.    [provider]  ONE TOUCH ULTRA TEST test strip 1 each by Other route as needed (GLUCOSE).  05/06/15   [provider]  pioglitazone (ACTOS) 30 MG tablet Take 30 mg by mouth daily.    [provider]  polyethylene glycol powder (GLYCOLAX/MIRALAX) 17 GM/SCOOP powder Dissolve 17 g in liquid and take by mouth daily. 05/28/23   Briant Cedar, MD  prochlorperazine (COMPAZINE) 10 MG tablet Take 1 tablet (10 mg total) by mouth every 6 (six) hours as needed. 06/10/23   Rana Snare, NP  repaglinide (PRANDIN) 2 MG tablet Take 2 mg by mouth 2 (two) times daily. 05/16/20   [provider]  senna-docusate (SENOKOT-S) 8.6-50 MG tablet Take 1 tablet by mouth at bedtime. 05/28/23   Briant Cedar, MD  traZODone (DESYREL) 50 MG tablet Take 1 tablet (50 mg total) by mouth at  bedtime as needed for sleep. Patient not taking: Reported on 06/17/2023 06/06/23 07/06/23  [name], MD    Scheduled Meds:  sodium chloride   Intravenous Once   atorvastatin  10 mg Oral Daily   insulin aspart  0-15 Units Subcutaneous Q4H   [START ON 06/29/2023] insulin glargine-yfgn  20 Units Subcutaneous Daily   metoprolol succinate  25 mg Oral Daily   morphine  30 mg Oral Q12H   Continuous Infusions:  sodium chloride 125 mL/hr  at 06/28/23 1015   PRN Meds:.acetaminophen **OR** acetaminophen, albuterol, HYDROmorphone (DILAUDID) injection, HYDROmorphone, ondansetron **OR** ondansetron (ZOFRAN) IV  Allergies as of 06/28/2023 - Review Complete 06/28/2023  Allergen Reaction Noted   Cat dander Anaphylaxis 02/04/2023   Empagliflozin Other (See Comments) 05/17/2020    Family History  Problem Relation Age of Onset   Heart disease Father    Hernia Father    Healthy Sister    Stroke Paternal Grandmother    Cancer Mother        THYROID   Hypotension Mother    Heart attack Maternal Grandfather    Congestive Heart Failure Maternal Grandfather    Heart attack Paternal Grandfather    Colon cancer Maternal Aunt 79   Congestive Heart Failure Paternal Uncle    Congestive Heart Failure Maternal Grandmother    Colon cancer Other 40       MGMs brother   Colon cancer Other        MGFs brother    Social History   Socioeconomic History   Marital status: Married    Spouse name: Not on file   Number of children: Not on file   Years of education: Not on file   Highest education level: Not on file  Occupational History   Occupation: executive  Tobacco Use   Smoking status: Never   Smokeless tobacco: Never  Vaping Use   Vaping status: Never Used  Substance and Sexual Activity   Alcohol use: Never    Alcohol/week: 1.0 standard drink of alcohol    Types: 1 Glasses of wine per week    Comment: occasional   Drug use: Never   Sexual activity: Not Currently  Other Topics Concern    Not on file  Social History Narrative   Never smoked Alcohol yes, rare ,1-2 per [employer] 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 Right   Are you currently employed ?    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   Who lives with you? Wife and son   What type of home do you live in: 1 story or 2 story? two   Caffiene occ.    Social Drivers of Corporate investment banker Strain: Not on file  Food Insecurity: No Food Insecurity (06/03/2023)   Hunger Vital Sign    Worried About Running Out of Food in the Last Year: Never true    Ran Out of Food in the Last Year: Never true  Transportation Needs: No Transportation Needs (06/03/2023)   PRAPARE - Administrator, Civil Service (Medical): No    Lack of Transportation (Non-Medical): No  Physical Activity: Not on file  Stress: Not on file  Social Connections: Socially Integrated (06/03/2023)   Social Connection and Isolation Panel [NHANES]    Frequency of Communication with Friends and Family: More than three times a week    Frequency of Social Gatherings with Friends and Family: Once a week    Attends Religious Services: More than 4 times per year    Active Member of [employer] or Organizations: Yes    Attends Banker Meetings: 1 to 4 times per year    Marital Status: Married  Catering manager Violence: Not At Risk (06/03/2023)   Humiliation, Afraid, Rape, and Kick questionnaire    Fear of Current or Ex-Partner: No  Emotionally Abused: No    Physically Abused: No    Sexually Abused: No    Review of Systems: All negative except as stated above in HPI.  Physical Exam: Vital signs: Vitals:   06/28/23 1300 06/28/23 1347  BP:  129/86  Pulse: (!) 111 (!) 103  Resp: 14 17  Temp:  98.1 F (36.7 C)  SpO2: 100% 100%     General:  Pale, lethargic, mild acute  distress, well-nourished, pleasant  Head: normocephalic, atraumatic Eyes: anicteric sclera ENT: oropharynx clear Neck: supple, nontender Lungs:  Clear throughout to auscultation.   No wheezes, crackles, or rhonchi. No acute distress. Heart:  Regular rate and rhythm; no murmurs, clicks, rubs,  or gallops. Abdomen: diffuse tenderness with guarding, soft, nondistended, +BS  Rectal:  Deferred Ext: no edema  GI:  Lab Results: Recent Labs    06/28/23 1000 06/28/23 1008  WBC 4.7  --   HGB 6.5* 6.5*  HCT 21.0* 19.0*  PLT 74*  --    BMET Recent Labs    06/28/23 1000 06/28/23 1008  NA 129* 130*  K 5.0 4.8  CL 99 99  CO2 20*  --   GLUCOSE 251* 242*  BUN 24* 21*  CREATININE 0.62 0.60*  CALCIUM 7.6*  --    LFT Recent Labs    06/28/23 1000  PROT 5.5*  ALBUMIN 2.0*  AST 41  ALT 18  ALKPHOS 676*  BILITOT 0.9   PT/INR No results for input(s): "LABPROT", "INR" in the last 72 hours.   Studies/Results:   Impression/Plan: GI bleeding in the setting of Pradaxa (last dose 3/29 at 10pm) and metastatic colon cancer to bone (last colonoscopy 2021). CT angiogram NEG for active GI bleeding but shows abnormal colonic wall thickening distal to anastomosis in distal sigmoid colon. Stools were black initially and then looked maroon. Abdominal pain on exam. No diverticulosis seen on last colonoscopy. Recurrence of colonic malignancy possible as is ischemic colitis. Supportive care. Will need an updated colonoscopy (or unprepped flex sig to assess the anastomosis) and possibly an EGD in the next 1-2 days. Hold Pradaxa. Clear liquid diet. NPO p MN in case EGD and unprepped flex sig needed tomorrow. Supportive care. Dr. Dulce Sellar to re-evaluate tomorrow.   LOS: 0 days   Shirley Friar  06/28/2023, 2:08 PM  Questions please call [phone]

## 2023-06-29 ENCOUNTER — Inpatient Hospital Stay

## 2023-06-29 ENCOUNTER — Inpatient Hospital Stay: Admitting: Nurse Practitioner

## 2023-06-29 DIAGNOSIS — K922 Gastrointestinal hemorrhage, unspecified: Secondary | ICD-10-CM | POA: Diagnosis not present

## 2023-06-29 LAB — GLUCOSE, CAPILLARY
Glucose-Capillary: 123 mg/dL — ABNORMAL HIGH (ref 70–99)
Glucose-Capillary: 139 mg/dL — ABNORMAL HIGH (ref 70–99)
Glucose-Capillary: 141 mg/dL — ABNORMAL HIGH (ref 70–99)
Glucose-Capillary: 150 mg/dL — ABNORMAL HIGH (ref 70–99)
Glucose-Capillary: 152 mg/dL — ABNORMAL HIGH (ref 70–99)
Glucose-Capillary: 164 mg/dL — ABNORMAL HIGH (ref 70–99)
Glucose-Capillary: 164 mg/dL — ABNORMAL HIGH (ref 70–99)

## 2023-06-29 LAB — HEMOGLOBIN AND HEMATOCRIT, BLOOD
HCT: 22.4 % — ABNORMAL LOW (ref 39.0–52.0)
HCT: 21.7 % — ABNORMAL LOW (ref 39.0–52.0)
HCT: 23.5 % — ABNORMAL LOW (ref 39.0–52.0)
Hemoglobin: 7.2 g/dL — ABNORMAL LOW (ref 13.0–17.0)
Hemoglobin: 6.7 g/dL — CL (ref 13.0–17.0)
Hemoglobin: 7.5 g/dL — ABNORMAL LOW (ref 13.0–17.0)

## 2023-06-29 LAB — PREPARE RBC (CROSSMATCH)

## 2023-06-29 MED ORDER — METOPROLOL TARTRATE 5 MG/5ML IV SOLN: 5.0000 mg | INTRAVENOUS | Status: DC | PRN

## 2023-06-29 MED ORDER — SODIUM CHLORIDE 0.9% IV SOLUTION: Freq: Once | INTRAVENOUS | Status: AC

## 2023-06-29 MED ORDER — MORPHINE SULFATE ER 15 MG PO TBCR
60.0000 mg | EXTENDED_RELEASE_TABLET | Freq: Two times a day (BID) | ORAL | Status: DC
  Administered 2023-06-29 – 2023-07-05 (×13): 60 mg via ORAL
  Filled 2023-06-29 (×13): qty 4

## 2023-06-29 MED ORDER — HYDRALAZINE HCL 20 MG/ML IJ SOLN: 10.0000 mg | INTRAMUSCULAR | Status: DC | PRN

## 2023-06-29 MED ORDER — PEG 3350-KCL-NA BICARB-NACL 420 G PO SOLR
4000.0000 mL | Freq: Once | ORAL | Status: AC
  Administered 2023-06-29: 4000 mL via ORAL

## 2023-06-29 MED ORDER — SENNOSIDES-DOCUSATE SODIUM 8.6-50 MG PO TABS
1.0000 | ORAL_TABLET | Freq: Every evening | ORAL | Status: DC | PRN
  Administered 2023-07-02: 1 via ORAL
  Filled 2023-06-29: qty 1

## 2023-06-29 MED ORDER — BUDESONIDE 0.25 MG/2ML IN SUSP
0.2500 mg | Freq: Two times a day (BID) | RESPIRATORY_TRACT | Status: DC
  Administered 2023-06-29 – 2023-07-11 (×24): 0.25 mg via RESPIRATORY_TRACT
  Filled 2023-06-29 (×24): qty 2

## 2023-06-29 MED ORDER — LORATADINE 10 MG PO TABS
10.0000 mg | ORAL_TABLET | Freq: Every day | ORAL | Status: DC | PRN
  Administered 2023-06-29 – 2023-07-02 (×2): 10 mg via ORAL
  Filled 2023-06-29 (×2): qty 1

## 2023-06-29 MED ORDER — HYDROXYZINE HCL 10 MG PO TABS
10.0000 mg | ORAL_TABLET | Freq: Once | ORAL | Status: AC | PRN
  Administered 2023-06-29: 10 mg via ORAL
  Filled 2023-06-29: qty 1

## 2023-06-29 MED ORDER — GUAIFENESIN 100 MG/5ML PO LIQD: 5.0000 mL | ORAL | Status: DC | PRN

## 2023-06-29 MED ORDER — PANTOPRAZOLE SODIUM 40 MG IV SOLR
40.0000 mg | Freq: Two times a day (BID) | INTRAVENOUS | Status: DC
  Administered 2023-06-29 – 2023-07-11 (×25): 40 mg via INTRAVENOUS
  Filled 2023-06-29 (×25): qty 10

## 2023-06-29 MED ORDER — SODIUM CHLORIDE 0.9 % IV SOLN: INTRAVENOUS | Status: AC

## 2023-06-29 NOTE — Progress Notes (Addendum)
 NAPOLEON MONACELLI   DOB:Oct 22, 1965   WU#:[id number]      ASSESSMENT & PLAN:  Adenocarcinoma of colon, metastatic - Moderate to poorly differentiated - Diagnosed 01/07/2019 - Status post left colectomy 03/04/2019.  Status post FOLFOX chemotherapy from 2022 2021 x 12 cycles, completed 08/22/2019. - Subsequently started on chemotherapy regimen FOLFIRI/bevacizumab on 05/26/2022.  Completed cycle 24 on 05/06/2023. - Started on cycle 1 FOLFOX/Avastin on 06/11/2023 - Medical oncology/[name] following.  Last seen in outpatient oncology office on 06/10/2023  GI bleed, per rectum Acute blood loss anemia Acute on chronic anemia - Low hemoglobin likely related to lower GI bleed - Hemoglobin low 7.2 today - Awaiting endoscopy and colonoscopy, currently n.p.o. - Transfuse PRBC for Hgb <7.0 - Continue to monitor CBC with differential closely  Severe pain - Secondary to extensive bone mets - Continue judicious pain management  Diabetes Hypertension - Continue monitoring blood sugar levels and insulin per sliding scale - Continue antihypertensives as ordered - Monitor closely    Code Status Full No CODE BLUE per patient  Subjective:  Patient seen awake and alert laying in bed.  Reports that he had another bloody bowel movement.  Patient's wife is at bedside.  Reports he is n.p.o. for endoscopy and colonoscopy today.  Requesting inhalers and Claritin used at home, request submitted to primary.  No other acute distress is noted.  Objective:  Vitals:   06/29/23 0201 06/29/23 1015  BP: (!) 144/81 131/83  Pulse: (!) 105 (!) 104  Resp: 16 18  Temp: 98.3 F (36.8 C)   SpO2: 95%      Intake/Output Summary (Last 24 hours) at 06/29/2023 1120 Last data filed at 06/29/2023 0600 Gross per 24 hour  Intake 2917.67 ml  Output --  Net 2917.67 ml     REVIEW OF SYSTEMS:   Constitutional: Denies fevers, chills or abnormal night sweats Eyes: Denies blurriness of vision, double vision or watery  eyes Ears, nose, mouth, throat, and face: Denies mucositis or sore throat Respiratory: Denies cough, dyspnea or wheezes Cardiovascular: Denies palpitation, chest discomfort or lower extremity swelling Gastrointestinal: + Bloody bowel movements Skin: Denies abnormal skin rashes Lymphatics: Denies new lymphadenopathy or easy bruising Neurological: Denies numbness, tingling or new weaknesses Behavioral/Psych: Mood is stable, no new changes  All other systems were reviewed with the patient and are negative.  PHYSICAL EXAMINATION: ECOG PERFORMANCE STATUS: 2 - Symptomatic, <50% confined to bed  Vitals:   06/29/23 0201 06/29/23 1015  BP: (!) 144/81 131/83  Pulse: (!) 105 (!) 104  Resp: 16 18  Temp: 98.3 F (36.8 C)   SpO2: 95%    Filed Weights   06/28/23 0929  Weight: 180 lb (81.6 kg)    GENERAL: alert, no distress and comfortable SKIN: skin color, texture, turgor are normal, no rashes or significant lesions EYES: normal, conjunctiva are pink and non-injected, sclera clear OROPHARYNX: no exudate, no erythema and lips, buccal mucosa, and tongue normal  NECK: supple, thyroid normal size, non-tender, without nodularity LYMPH: no palpable lymphadenopathy in the cervical, axillary or inguinal LUNGS: clear to auscultation and percussion with normal breathing effort HEART: regular rate & rhythm and no murmurs and no lower extremity edema ABDOMEN: abdomen soft, non-tender and normal bowel sounds MUSCULOSKELETAL: no cyanosis of digits and no clubbing  PSYCH: alert & oriented x 3 with fluent speech NEURO: no focal motor/sensory deficits   All questions were answered. The patient knows to call the clinic with any problems, questions or concerns.   The  total time spent in the appointment was 40 minutes encounter with patient including review of chart and various tests results, discussions about plan of care and coordination of care plan  [name], NP 06/29/2023 11:20 AM    Labs  Reviewed:  Lab Results  Component Value Date   WBC 4.7 06/28/2023   HGB 7.2 (L) 06/29/2023   HCT 22.4 (L) 06/29/2023   MCV 101.0 (H) 06/28/2023   PLT 74 (L) 06/28/2023   Recent Labs    06/10/23 1555 06/23/23 1558 06/28/23 1000 06/28/23 1008  NA 132* 128* 129* 130*  K 4.5 5.1 5.0 4.8  CL 98 95* 99 99  CO2 27 23 20*  --   GLUCOSE 220* 203* 251* 242*  BUN 18 21* 24* 21*  CREATININE 0.53* 0.66 0.62 0.60*  CALCIUM 7.5* 7.9* 7.6*  --   GFRNONAA >60 >60 >60  --   PROT 5.2* 5.8* 5.5*  --   ALBUMIN 2.6* 3.2* 2.0*  --   AST 22 29 41  --   ALT 12 11 18   --   ALKPHOS 463* 483* 676*  --   BILITOT 0.4 0.7 0.9  --     Studies Reviewed:  CT ANGIO GI BLEED Result Date: 06/28/2023 CLINICAL DATA:  Lower GI bleeding. Mesenteric ischemia, acute. Metastatic colon cancer EXAM: CTA ABDOMEN AND PELVIS WITHOUT AND WITH CONTRAST TECHNIQUE: Multidetector CT imaging of the abdomen and pelvis was performed using the standard protocol during bolus administration of intravenous contrast. Multiplanar reconstructed images and MIPs were obtained and reviewed to evaluate the vascular anatomy. RADIATION DOSE REDUCTION: This exam was performed according to the departmental dose-optimization program which includes automated exposure control, adjustment of the mA and/or kV according to patient size and/or use of iterative reconstruction technique. CONTRAST:  80mL OMNIPAQUE IOHEXOL 350 MG/ML SOLN COMPARISON:  06/04/2023 FINDINGS: VASCULAR Aorta: Normal caliber aorta without aneurysm, dissection, vasculitis or significant stenosis. Mild atherosclerosis. Celiac: Patent without evidence of aneurysm, dissection, vasculitis or significant stenosis. SMA: Patent without evidence of aneurysm, dissection, vasculitis or significant stenosis. Renals: The right renal artery is patent without evidence of aneurysm, dissection, vasculitis, fibromuscular dysplasia or significant stenosis. Patient has had a left nephrectomy. IMA: The  origin and proximal aspect of the inferior mesenteric artery is not identified. This does appear to be reconstituted by branches of the superior mesenteric artery. Inflow: Patent without evidence of aneurysm, dissection, vasculitis or significant stenosis. Proximal Outflow: Bilateral common femoral and visualized portions of the superficial and profunda femoral arteries are patent without evidence of aneurysm, dissection, vasculitis or significant stenosis. Veins: Major venous structures are patent. Review of the MIP images confirms the above findings. NON-VASCULAR Lower chest: Small layering left pleural effusion. Hepatobiliary: 1.1 cm hypoattenuating lesion in the periphery of the right lower lobe, not definitively seen on the prior study (series 18, image 29. Previously described subtly hypoattenuating lesions within the liver at the gallbladder fossa and IVC are difficult to characterize but do not appear appreciably changed. Atrophy of the left hepatic lobe. Right hepatic lobe measures 22 cm in length. Small stone within the gallbladder. No biliary dilatation. Pancreas: Grossly unremarkable. Spleen: Grossly unchanged appearance of the spleen with multiple heterogeneously areas of hypoattenuation including dominant lesion measuring approximately 11.2 x 8.5 cm. 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 Adrenals/Urinary Tract: Thickened appearance of the adrenal glands. Prior left nephrectomy. Unremarkable right kidney. No stone or hydronephrosis. Unremarkable urinary bladder. Stomach/Bowel: Tiny hiatal hernia. Stomach otherwise within normal  limits. No abnormally dilated loops of bowel. Postsurgical changes from prior sigmoid resection and reanastomosis abnormal colonic wall thickening in the region of the distal sigmoid colon (series 18, image 66). There is also focal colonic wall thickening just distal to the anastomotic site (series 18, image 57). Normal appendix in the right lower  quadrant (series 18, image 53). No evidence of abnormal contrast accumulation within the bowel on multiphasic imaging to localize site of active gastrointestinal bleeding. Lymphatic: Mildly prominent left external iliac chain lymph node measuring 9 mm (series 18, image 59), unchanged. No new or enlarging abdominopelvic lymph nodes. Reproductive: Prostatomegaly. Other: No ascites or pneumoperitoneum. Musculoskeletal: Extensive, widespread osseous metastatic disease, without evidence of significant progression compared to the prior study. Scoliotic spinal curvature. No pathologic fractures are seen. IMPRESSION: 1. The origin and proximal aspect of the inferior mesenteric artery is not identified. This does appear to be reconstituted by branches of the superior mesenteric artery. Otherwise, no significant vascular abnormality. 2. No evidence of abnormal contrast accumulation within the bowel to localize site of active gastrointestinal bleeding. 3. Postsurgical changes from prior sigmoid resection and reanastomosis. Abnormal colonic wall thickening in the region of the distal sigmoid colon and just distal to the anastomotic site. Findings may represent sites of focal colitis, however recurrent malignancy is not excluded. Follow-up colonoscopy should be considered following the resolution of patient's acute symptoms. 4. New 1.1 cm hypoattenuating lesion in the periphery of the right hepatic lobe, not definitively seen on the prior study. This is concerning for a new site of metastatic disease. 5. Extensive, widespread osseous metastatic disease, without evidence of significant progression compared to the prior study. 6. Small layering left pleural effusion. 7. Cholelithiasis. 8. Aortic atherosclerosis. Electronically Signed   By: Duanne Guess D.O.   On: 06/28/2023 11:18   CT ABDOMEN PELVIS W CONTRAST Result Date: 06/04/2023 CLINICAL DATA:  Acute generalized abdominal pain. History of metastatic colon cancer. EXAM:  CT ABDOMEN AND PELVIS WITH CONTRAST TECHNIQUE: Multidetector CT imaging of the abdomen and pelvis was performed using the standard protocol following bolus administration of intravenous contrast. RADIATION DOSE REDUCTION: This exam was performed according to the departmental dose-optimization program which includes automated exposure control, adjustment of the mA and/or kV according to patient size and/or use of iterative reconstruction technique. CONTRAST:  OMNIPAQUE IOHEXOL 300 MG/ML  SOLN COMPARISON:  April 04, 2023.  May 09, 2022. FINDINGS: Lower chest: No acute abnormality. Hepatobiliary: Small gallstone. No biliary dilatation. Liver is unremarkable. Pancreas: Unremarkable. No pancreatic ductal dilatation or surrounding inflammatory changes. Spleen: 11 x 9.1 cm complex lesion seen in the spleen as described on prior exam which may represent hamartoma as noted on prior studies. Adrenals/Urinary Tract: Adrenal glands are unremarkable. Status post left nephrectomy. No hydronephrosis or renal obstruction. Urinary bladder is unremarkable. Stomach/Bowel: Stomach is unremarkable. There is no evidence of bowel obstruction or inflammation. Appendix appears normal. Vascular/Lymphatic: Aortic atherosclerosis. No enlarged abdominal or pelvic lymph nodes. Reproductive: Prostate is unremarkable. Other: No ascites or hernia is noted. Musculoskeletal: Grossly stable sclerotic densities noted throughout skeleton consistent with osseous metastases. IMPRESSION: Small gallstone. 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 Grossly stable diffuse osseous sclerotic densities consistent with osseous metastases. Aortic Atherosclerosis (ICD10-I70.0). Electronically Signed   By: Lupita Raider M.D.   On: 06/04/2023 19:14   Mr. Groh was interviewed and examined.  He is well-known to me with a history of metastatic colon cancer, currently being treated with salvage FOLFOX/bevacizumab, last treated 06/11/2023.  Chemotherapy was held last week due to severe anemia.  He was transfused with 2 units of packed red blood cells on 06/24/2023.  He was admitted on 06/28/2023 with acute onset rectal bleeding.  The hemoglobin returned at 6.5.  He was transfused additional packed red blood cells on 06/28/2023.  He reports rectal bleeding again last night.  Mr. Gatley continues to have pain in the pelvis, controlled with the narcotic regimen.  He has bilateral leg weakness limiting his ability to ambulate.  He has been maintained on a coagulation therapy for treatment of a left lower extremity DVT diagnosed in September 2023.  Mr. Swiatek has advanced metastatic colon cancer.  He understands the poor prognosis.  We began a discussion regarding end-of-life issues including CPR/ACLS.  He is in agreement with a no CODE BLUE status, but he would like to discuss this with his wife prior to changing his CODE STATUS.  The bleeding may be related to recurrent tumor involving the GI tract or another etiology.  Recommendations: Hold anticoagulation Transfuse packed red blood cells for ongoing bleeding and symptomatic anemia Evaluation for source of blood loss per gastroenterology Continue discussion regarding goals of care and CODE STATUS with Mr. Stratmann and his wife Continue Dilaudid and morphine for pain PT evaluation Oncology will continue following him in the hospital and outpatient follow-up will be scheduled [facility]

## 2023-06-29 NOTE — Progress Notes (Signed)
 Subjective: Ongoing bleeding and Hgb drop. Mild lower abdominal pain.  Objective: Vital signs in last 24 hours: Temp:  [97.6 F (36.4 C)-100.3 F (37.9 C)] 97.6 F (36.4 C) (03/31 1216) Pulse Rate:  [94-110] 94 (03/31 1216) Resp:  [16-20] 18 (03/31 1216) BP: (125-160)/(80-88) 145/81 (03/31 1216) SpO2:  [95 %-100 %] 98 % (03/31 1216) Weight change:  Last BM Date : 06/28/23  PE: GEN:  Pale, slightly tachypneic-appearing at rest SKIN:  Pale ABD:  Soft, mild lower abdominal tenderness without peritonitis  Lab Results: CBC    Component Value Date/Time   WBC 4.7 06/28/2023 1000   RBC 2.08 (L) 06/28/2023 1000   HGB 7.2 (L) 06/29/2023 0346   HGB 6.2 (LL) 06/23/2023 1558   HGB 13.9 02/08/2020 1128   HCT 22.4 (L) 06/29/2023 0346   HCT 40.9 02/08/2020 1128   PLT 74 (L) 06/28/2023 1000   PLT 70 (L) 06/23/2023 1558   PLT 114 (L) 02/08/2020 1128   MCV 101.0 (H) 06/28/2023 1000   MCV 97 02/08/2020 1128   MCH 31.3 06/28/2023 1000   MCHC 31.0 06/28/2023 1000   RDW 22.0 (H) 06/28/2023 1000   RDW 13.5 02/08/2020 1128   LYMPHSABS 0.6 (L) 06/28/2023 1000   MONOABS 1.0 06/28/2023 1000   EOSABS 0.1 06/28/2023 1000   BASOSABS 0.0 06/28/2023 1000   Assessment:   Persistent hematochezia. Metastatic colon cancer, on chemotherapy, last dose a couple weeks ago. Acute blood loss anemia. Chronic anticoagulation, on hold.  Plan:   Plan endoscopy and colonoscopy tomorrow, assuming appropriate Hgb and Plt counts. Clear liquids, NPO after midnight. Risks (bleeding, infection, bowel perforation that could require surgery, sedation-related changes in cardiopulmonary systems), benefits (identification and possible treatment of source of symptoms, exclusion of certain causes of symptoms), and alternatives (watchful waiting, radiographic imaging studies, empiric medical treatment) of upper endoscopy (EGD) were explained to patient/family in detail and patient wishes to proceed.  Risks (bleeding,  infection, bowel perforation that could require surgery, sedation-related changes in cardiopulmonary systems), benefits (identification and possible treatment of source of symptoms, exclusion of certain causes of symptoms), and alternatives (watchful waiting, radiographic imaging studies, empiric medical treatment) of colonoscopy were explained to patient/family in detail and patient wishes to proceed.  Anticoagulation, on hold. [facility] will follow.   [name] 06/29/2023, 1:54 PM   Cell [phone] If no answer or after 5 PM call [phone]

## 2023-06-29 NOTE — Hospital Course (Addendum)
 58 year old with history of colon cancer on FOLFOX, Avastin follows Dr [name], insulin-dependent DM2, HTN, HLD, DVT on Pradaxa admitted to the hospital for bright red blood per rectum/acute blood loss anemia.  Recently treated for gluteal fold cellulitis.  Last dose of Pradaxa was the night before his hospitalization.  Admission hemoglobin was noted to be 6.5 therefore PRBC transfusion was ordered.  CT angio was negative for active bleeding but showed colonic wall thickening distal to prior anastomosis.  Colonoscopy prep was overall inadequate stool was noted in sigmoid colon.  There was oozing distal descending colon blood vessel which was endoscopically treated.  EGD showed grade B esophagitis and congested duodenal mucosa but no other signs of bleeding.  For now GI recommended holding off on anticoagulation for at least 1 week. Overall remains hemodynamically stable. Due to complaints of decreased hearing on left ear discussed with oncology question if he has metastatic spots at base of skull-advised hospice based upon his overall functional status and metastatic disease AND patient does not feel ready yet. Patient was having ongoing worsening pain in his hip bone back and also decreasing hearing sensation After extensive discussion with oncology palliative care patient wife and patient agreed for hospice [facility] evaluating He has been approved for [employer]  Consultation: Gastroenterology [facility] Oncology Palliative Care   Procedures/testing: E4/1 : EGD/ COLONOSCOPY (Left)-CONTROL OF HEMORRHAGE, GI TRACT, ENDOSCOPIC, BY CLIPPING OR OVERSEWING SCLEROTHERAPY   Subjective: Seen and examined Having lots of pain Wife at bedside Having difficulty hearing Patient complains of ongoing pain, discussed with [name] recommending transition to Dilaudid drip  and ok for [facility] whenever they have a bed on oral antibiotics   Discharge Diagnoses:  Primary problem: Adenocarcinoma of  colon with metastasis-diagnosed 01/07/2019 s/p colectomy hx and multiple chemo treatment Severe cancer-related pain-diffusedly in bones Lower extremity weakness/debility from cancer Goals of care/CODE STATUS: Patient has pain due to extensive bone metastasis and has deconditioning and weakness. At home on 30 mg bid ms contin and po dilaudid 4mg  q4h prn, Having worsening pain despite being on  MS Contin 100 mg bid, po decadron, and IV/PO Dilaudid prn> His life expectancy 2wks-month per oncology and [facility] hospice recommended and TOC consulted. Extensively discussed goals of care and encourageD DNR/DNI  Dr Truett Perna extensively discussed about hospice at this point of time but patient ad his wife was not electing to enter hospice. After further discussion given that patient has uncontrolled pain despite being on high dose of MS Contin and oral IV opiates, he continues to worsen clinically, due to the extensive metastatic disease after further discussion with patient and wife subsequently became agreeable for DNR> [facility] consulted palliative care consulted and at this time looking into transfer to [facility] once they have bed Requesting Dilaudid drip for pain management- started and continue on it as [facility].  BRBPR-colorectal ABLA Acute on chronic anemia: B/L hB ~8.0, s/p 3 units of PRBC-and HB stable since. S/P EGD colonoscopy 4/1- prep was overall inadequate,oozing distal descending colon blood vessel which was endoscopically treated.  EGD showed grade B esophagitis and congested duodenal mucosa but no other signs of bleeding.  GI recommended to hold off anticoagulation at least 1 week and gi signed off Cont PPI, cont to hold aspirin. Recent Labs  Lab 07/06/23 0348 07/07/23 1151 07/08/23 0321 07/09/23 0259 07/10/23 0238  HGB 9.0* 8.9* 8.4* 8.8* 9.2*  HCT 28.7* 29.3* 27.2* 28.3* 29.5*   Acute on chronic thrombocytopenia: In the setting of bony metastasis  and bone marrow  infiltration. Discussed with Dr. Scherrie Curt  and not doing labs at this time-goal is for comfort and [facility] transfer. Recent Labs  Lab 07/06/23 0348 07/07/23 1151 07/08/23 0321 07/09/23 0259 07/10/23 0238  PLT 20* 20* 17* 15* 12*    Hyperbilirubinemia/Transaminitis Distended gallbladder with sludge and stones ? Acute cholecystitis: Transaminitis likely multifactorial mostly from bone metastasis - RUQ  US  showed distended gallbladder with sludge and stone, borderline wall thickening-surgery was consulted placed on IV antibiotics and HIDA scan ordered but given he is needing continuous narcotics along with scheduled MS Contin, HIDA scan will likely be false-extensively discussed with patient's oncologist, surgery-and given difficult situation at this time decided to continue antibiotics ,supportive care, pain management and HIDA scan discontinued per [facility]. he is a poor surgical candidate TB remains elevated alk phos trending up due to his metastasis ON Ceftriaxone and Flagyl> can change to oral antibiotics on transfer to [facility] Recent Labs  Lab 07/06/23 0348 07/07/23 0349 07/07/23 1151 07/08/23 0321 07/09/23 0259 07/10/23 0238  AST 119* 102*  --  106* 141* 136*  ALT 45* 42  --  41 44 42  ALKPHOS 648* 610*  --  835* 1,093* 1,449*  BILITOT 3.4* 3.2*  --  2.8* 3.2* 3.9*  PROT 5.0* 5.0*  --  4.6* 4.6* 4.4*  ALBUMIN 1.7* 1.6*  --  1.7* 1.7* 1.7*  PLT 20*  --  20* 17* 15* 12*   Diminished hearing sensation of the left/fuzziness/ringing since the left ear: Continue eardrops.  Having worsening of his diminished hearing bilaterally-discussed with Dr. 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 Having ringing sensation. MRI brain with and without contrast on 05/25/23 had 2 punctate foci of enhancement on the left parietal occipital region could  be metastasis,  IDDM type 2 with uncontrolled hyperglycemia: PTA on Plargine 41 u daily, metformin 1000 twice daily ON Semglee to 15 units and SSI WHILE HERE Recent Labs  Lab 07/10/23 0803 07/10/23 1157 07/10/23 1639 07/10/23 2027 07/11/23 0727  GLUCAP 289* 235* 281* 309* 149*    Hypocalcemia Hypomagnesemia Hyponatremia: Corrected calcium is stable due to hypoalbuminemia, magnesium replaced, sodium low   Hyperlipidemia Holding statin 2/2 transaminitis.  Essential hypertension BP stable on Toprol-XL 25 mg.Lisinopril on hold.  History of DVT Pradaxa remains on hold due to GI bleeding- on hold x 1 week- but given thrombocytopenia continue to hold for now

## 2023-06-29 NOTE — Progress Notes (Signed)
 I have reviewed and concur with this student's documentation.   Prim Morace [employer] trader, RN 06/29/2023 5:05 PM

## 2023-06-29 NOTE — Progress Notes (Signed)
 Transition of Care [facility]) - Inpatient Brief Assessment  Patient Details  Name: [name] MRN: [id number] Date of Birth: [birth year]  Transition of Care [facility]) CM/SW Contact:    Ewing Schlein, LCSW Phone Number: 06/29/2023, 10:35 AM  Clinical Narrative: Screening completed. No TOC needs identified at this time.  Transition of Care Asessment: Insurance and Status: Insurance coverage has been reviewed Patient has primary care physician: Yes Home environment has been reviewed: Resides in single family home with spouse Prior level of function:: Independent with ADLs at baseline Prior/Current Home Services: No current home services Social Drivers of Health Review: SDOH reviewed no interventions necessary Readmission risk has been reviewed: Yes Transition of care needs: no transition of care needs at this time   06/29/23 1035  Readmission Prevention Plan - High Risk  Transportation Screening Complete  Home Care Consult (High Risk) Complete  High Risk Social Work Consult for recovery care planning/counseling (includes patient and caregiver) Complete  High Risk Palliative Care Screening Not Applicable  Medication Review Complete

## 2023-06-29 NOTE — Progress Notes (Signed)
 PROGRESS NOTE    [name]  WJX:[id number] DOB: 10/14/1965 DOA: 06/28/2023 PCP: David Rua, MD    Brief Narrative:   58 year old with history of colon cancer on FOLFOX, Avastin follows Dr [name], insulin-dependent DM2, HTN, HLD, DVT on Pradaxa admitted to the hospital for bright red blood per rectum/acute blood loss anemia.  Recently treated for gluteal fold cellulitis.  Last dose of Pradaxa was the night before his hospitalization.  Admission hemoglobin was noted to be 6.5 therefore PRBC transfusion was ordered.  CT angio was negative for active bleeding but showed colonic wall thickening distal to prior anastomosis.  Plans for EGD and flex sig versus colonoscopy.  [facility] following.  Assessment & Plan:  Principal Problem:   Lower GI bleed   Bright red blood per rectum Acute blood loss anemia Acute on chronic anemia and thrombocytopenia - Unclear exact source as this could be diverticular versus malignancy versus ischemic versus AVM. -Baseline hemoglobin 8.0, admission hemoglobin 6.5, received 2 units of PRBC.  Hemoglobin 7.2 this morning and has had further bleeding therefore will order additional unit of PRBC. - Currently n.p.o. for plans for EGD and flex sig versus colonoscopy -I will go ahead and add PPI until we complete his evaluation  Colon cancer, metastatic - On outpatient FOLFOX and Avastin.  Follows with Dr. Truett [name].  Insulin-dependent diabetes mellitus type 2 - On Semglee, Accu-Cheks and sliding scale.  Will further adjust as necessary -Holding multiple p.o. medications -Hold aspirin  Hypocalcemia - Repletion  Hyperlipidemia - Lipitor  Essential hypertension - Toprol-XL.  IV as needed -Currently holding lisinopril  History of DVT - Currently Pradaxa on hold  DVT prophylaxis: SCDs Start: 06/28/23 1229    Code Status: Full Code Family Communication:   Status is: Inpatient Remains inpatient appropriate because: On going eval for GI bleed.      Subjective: Had 1 large bloody bowel movement again this morning.  Appears quite pale.  Spouse is present at bedside.   Examination:  General exam: Appears calm and comfortable, ill Respiratory system: Clear to auscultation. Respiratory effort normal. Cardiovascular system: S1 & S2 heard, RRR. No JVD, murmurs, rubs, gallops or clicks. No pedal edema. Gastrointestinal system: Abdomen is nondistended, soft and nontender. No organomegaly or masses felt. Normal bowel sounds heard. Central nervous system: Alert and oriented. No focal neurological deficits. Extremities: Symmetric 5 x 5 power. Skin: No rashes, lesions or ulcers Psychiatry: Judgement and insight appear normal. Mood & affect appropriate.                Diet Orders (From admission, onward)     Start     Ordered   06/29/23 0001  Diet NPO time specified Except for: Ice Chips, Sips with Meds  Diet effective midnight       Question Answer Comment  Except for Ice Chips   Except for Sips with Meds      06/28/23 1439            Objective: Vitals:   06/28/23 2137 06/29/23 0201 06/29/23 1015 06/29/23 1216  BP: (!) 160/86 (!) 144/81 131/83 (!) 145/81  Pulse: (!) 110 (!) 105 (!) 104 94  Resp: 16 16 18 18   Temp: 99 F (37.2 C) 98.3 F (36.8 C)  97.6 F (36.4 C)  TempSrc: Oral   Oral  SpO2: 97% 95%  98%  Weight:      Height:        Intake/Output Summary (Last 24 hours) at 06/29/2023 1221 Last  data filed at 06/29/2023 0600 Gross per 24 hour  Intake 2917.67 ml  Output --  Net 2917.67 ml   Filed Weights   06/28/23 0929  Weight: 81.6 kg    Scheduled Meds:  sodium chloride   Intravenous Once   sodium chloride   Intravenous Once   atorvastatin  10 mg Oral Daily   Chlorhexidine Gluconate Cloth  6 each Topical Daily   insulin aspart  0-15 Units Subcutaneous Q4H   insulin glargine-yfgn  20 Units Subcutaneous Daily   metoprolol succinate  25 mg Oral Daily   morphine  60 mg Oral Q12H   pantoprazole  (PROTONIX) IV  40 mg Intravenous Q12H   Continuous Infusions:  sodium chloride 75 mL/hr at 06/29/23 0813    Nutritional status     Body mass index is 25.1 kg/m.  Data Reviewed:   CBC: Recent Labs  Lab 06/23/23 1558 06/28/23 1000 06/28/23 1008 06/29/23 0346  WBC 6.0 4.7  --   --   NEUTROABS 3.9 2.6  --   --   HGB 6.2* 6.5* 6.5* 7.2*  HCT 20.1* 21.0* 19.0* 22.4*  MCV 104.1* 101.0*  --   --   PLT 70* 74*  --   --    Basic Metabolic Panel: Recent Labs  Lab 06/23/23 1558 06/28/23 1000 06/28/23 1008  NA 128* 129* 130*  K 5.1 5.0 4.8  CL 95* 99 99  CO2 23 20*  --   GLUCOSE 203* 251* 242*  BUN 21* 24* 21*  CREATININE 0.66 0.62 0.60*  CALCIUM 7.9* 7.6*  --    GFR: Estimated Creatinine Clearance: 109.8 mL/min (A) (by C-G formula based on SCr of 0.6 mg/dL (L)). Liver Function Tests: Recent Labs  Lab 06/23/23 1558 06/28/23 1000  AST 29 41  ALT 11 18  ALKPHOS 483* 676*  BILITOT 0.7 0.9  PROT 5.8* 5.5*  ALBUMIN 3.2* 2.0*   No results for input(s): "LIPASE", "AMYLASE" in the last 168 hours. No results for input(s): "AMMONIA" in the last 168 hours. Coagulation Profile: No results for input(s): "INR", "PROTIME" in the last 168 hours. Cardiac Enzymes: No results for input(s): "CKTOTAL", "CKMB", "CKMBINDEX", "TROPONINI" in the last 168 hours. BNP (last 3 results) No results for input(s): "PROBNP" in the last 8760 hours. HbA1C: No results for input(s): "HGBA1C" in the last 72 hours. CBG: Recent Labs  Lab 06/29/23 0013 06/29/23 0415 06/29/23 0728 06/29/23 0856 06/29/23 1217  GLUCAP 164* 139* 150* 164* 152*   Lipid Profile: No results for input(s): "CHOL", "HDL", "LDLCALC", "TRIG", "CHOLHDL", "LDLDIRECT" in the last 72 hours. Thyroid Function Tests: No results for input(s): "TSH", "T4TOTAL", "FREET4", "T3FREE", "THYROIDAB" in the last 72 hours. Anemia Panel: No results for input(s): "VITAMINB12", "FOLATE", "FERRITIN", "TIBC", "IRON", "RETICCTPCT" in the  last 72 hours. Sepsis Labs: No results for input(s): "PROCALCITON", "LATICACIDVEN" in the last 168 hours.  No results found for this or any previous visit (from the past 240 hours).       Radiology Studies: CT ANGIO GI BLEED Result Date: 06/28/2023 CLINICAL DATA:  Lower GI bleeding. Mesenteric ischemia, acute. Metastatic colon cancer EXAM: CTA ABDOMEN AND PELVIS WITHOUT AND WITH CONTRAST TECHNIQUE: Multidetector CT imaging of the abdomen and pelvis was performed using the standard protocol during bolus administration of intravenous contrast. Multiplanar reconstructed images and MIPs were obtained and reviewed to evaluate the vascular anatomy. RADIATION DOSE REDUCTION: This exam was performed according to the departmental dose-optimization program which includes automated exposure control, adjustment of the mA and/or  kV according to patient size and/or use of iterative reconstruction technique. CONTRAST:  80mL OMNIPAQUE IOHEXOL 350 MG/ML SOLN COMPARISON:  06/04/2023 FINDINGS: VASCULAR Aorta: Normal caliber aorta without aneurysm, dissection, vasculitis or significant stenosis. Mild atherosclerosis. Celiac: Patent without evidence of aneurysm, dissection, vasculitis or significant stenosis. SMA: Patent without evidence of aneurysm, dissection, vasculitis or significant stenosis. Renals: The right renal artery is patent without evidence of aneurysm, dissection, vasculitis, fibromuscular dysplasia or significant stenosis. Patient has had a left nephrectomy. IMA: The origin and proximal aspect of the inferior mesenteric artery is not identified. This does appear to be reconstituted by branches of the superior mesenteric artery. Inflow: Patent without evidence of aneurysm, dissection, vasculitis or significant stenosis. Proximal Outflow: Bilateral common femoral and visualized portions of the superficial and profunda femoral arteries are patent without evidence of aneurysm, dissection, vasculitis or  significant stenosis. Veins: Major venous structures are patent. Review of the MIP images confirms the above findings. NON-VASCULAR Lower chest: Small layering left pleural effusion. Hepatobiliary: 1.1 cm hypoattenuating lesion in the periphery of the right lower lobe, not definitively seen on the prior study (series 18, image 29. Previously described subtly hypoattenuating lesions within the liver at the gallbladder fossa and IVC are difficult to characterize but do not appear appreciably changed. Atrophy of the left hepatic lobe. Right hepatic lobe measures 22 cm in length. Small stone within the gallbladder. No biliary dilatation. Pancreas: Grossly unremarkable. Spleen: Grossly unchanged appearance of the spleen with multiple heterogeneously areas of hypoattenuation including dominant lesion measuring approximately 11.2 x 8.5 cm. 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 Adrenals/Urinary Tract: Thickened appearance of the adrenal glands. Prior left nephrectomy. Unremarkable right kidney. No stone or hydronephrosis. Unremarkable urinary bladder. Stomach/Bowel: Tiny hiatal hernia. Stomach otherwise within normal limits. No abnormally dilated loops of bowel. Postsurgical changes from prior sigmoid resection and reanastomosis abnormal colonic wall thickening in the region of the distal sigmoid colon (series 18, image 66). There is also focal colonic wall thickening just distal to the anastomotic site (series 18, image 57). Normal appendix in the right lower quadrant (series 18, image 53). No evidence of abnormal contrast accumulation within the bowel on multiphasic imaging to localize site of active gastrointestinal bleeding. Lymphatic: Mildly prominent left external iliac chain lymph node measuring 9 mm (series 18, image 59), unchanged. No new or enlarging abdominopelvic lymph nodes. Reproductive: Prostatomegaly. Other: No ascites or pneumoperitoneum. Musculoskeletal: Extensive,  widespread osseous metastatic disease, without evidence of significant progression compared to the prior study. Scoliotic spinal curvature. No pathologic fractures are seen. IMPRESSION: 1. The origin and proximal aspect of the inferior mesenteric artery is not identified. This does appear to be reconstituted by branches of the superior mesenteric artery. Otherwise, no significant vascular abnormality. 2. No evidence of abnormal contrast accumulation within the bowel to localize site of active gastrointestinal bleeding. 3. Postsurgical changes from prior sigmoid resection and reanastomosis. Abnormal colonic wall thickening in the region of the distal sigmoid colon and just distal to the anastomotic site. Findings may represent sites of focal colitis, however recurrent malignancy is not excluded. Follow-up colonoscopy should be considered following the resolution of patient's acute symptoms. 4. New 1.1 cm hypoattenuating lesion in the periphery of the right hepatic lobe, not definitively seen on the prior study. This is concerning for a new site of metastatic disease. 5. Extensive, widespread osseous metastatic disease, without evidence of significant progression compared to the prior study. 6. Small layering left pleural effusion. 7. Cholelithiasis. 8. Aortic atherosclerosis.  Electronically Signed   By: Duanne Guess D.O.   On: 06/28/2023 11:18           LOS: 1 day   Time spent= 35 mins    Miguel Rota, MD [facility]  If 7PM-7AM, please contact night-coverage  06/29/2023, 12:21 PM

## 2023-06-30 ENCOUNTER — Inpatient Hospital Stay (HOSPITAL_COMMUNITY): Payer: Self-pay | Admitting: Anesthesiology

## 2023-06-30 ENCOUNTER — Encounter (HOSPITAL_COMMUNITY)
Admission: EM | Disposition: A | Discharge status: Hospice | Payer: Self-pay | Source: Home / Self Care | Attending: Internal Medicine

## 2023-06-30 DIAGNOSIS — K922 Gastrointestinal hemorrhage, unspecified: Secondary | ICD-10-CM | POA: Diagnosis not present

## 2023-06-30 HISTORY — PX: SCLEROTHERAPY: SHX6841

## 2023-06-30 HISTORY — PX: COLONOSCOPY: SHX5424

## 2023-06-30 HISTORY — PX: ESOPHAGOGASTRODUODENOSCOPY: SHX5428

## 2023-06-30 HISTORY — PX: HEMOSTASIS CLIP PLACEMENT: SHX6857

## 2023-06-30 LAB — GLUCOSE, CAPILLARY
Glucose-Capillary: 103 mg/dL — ABNORMAL HIGH (ref 70–99)
Glucose-Capillary: 115 mg/dL — ABNORMAL HIGH (ref 70–99)
Glucose-Capillary: 121 mg/dL — ABNORMAL HIGH (ref 70–99)
Glucose-Capillary: 141 mg/dL — ABNORMAL HIGH (ref 70–99)
Glucose-Capillary: 149 mg/dL — ABNORMAL HIGH (ref 70–99)
Glucose-Capillary: 156 mg/dL — ABNORMAL HIGH (ref 70–99)

## 2023-06-30 LAB — CBC
HCT: 22.4 % — ABNORMAL LOW (ref 39.0–52.0)
Hemoglobin: 7.2 g/dL — ABNORMAL LOW (ref 13.0–17.0)
MCH: 31.7 pg (ref 26.0–34.0)
MCHC: 32.1 g/dL (ref 30.0–36.0)
MCV: 98.7 fL (ref 80.0–100.0)
Platelets: 54 10*3/uL — ABNORMAL LOW (ref 150–400)
RBC: 2.27 MIL/uL — ABNORMAL LOW (ref 4.22–5.81)
RDW: 20.3 % — ABNORMAL HIGH (ref 11.5–15.5)
WBC: 4.6 10*3/uL (ref 4.0–10.5)
nRBC: 7.8 % — ABNORMAL HIGH (ref 0.0–0.2)

## 2023-06-30 LAB — COMPREHENSIVE METABOLIC PANEL WITH GFR
ALT: 15 U/L (ref 0–44)
AST: 36 U/L (ref 15–41)
Albumin: 1.9 g/dL — ABNORMAL LOW (ref 3.5–5.0)
Alkaline Phosphatase: 507 U/L — ABNORMAL HIGH (ref 38–126)
Anion gap: 9 (ref 5–15)
BUN: 17 mg/dL (ref 6–20)
CO2: 18 mmol/L — ABNORMAL LOW (ref 22–32)
Calcium: 7.3 mg/dL — ABNORMAL LOW (ref 8.9–10.3)
Chloride: 105 mmol/L (ref 98–111)
Creatinine, Ser: 0.6 mg/dL — ABNORMAL LOW (ref 0.61–1.24)
GFR, Estimated: >60 mL/min (ref >60)
Glucose, Bld: 152 mg/dL — ABNORMAL HIGH (ref 70–99)
Potassium: 3.9 mmol/L (ref 3.5–5.1)
Sodium: 132 mmol/L — ABNORMAL LOW (ref 135–145)
Total Bilirubin: 2.1 mg/dL — ABNORMAL HIGH (ref 0.0–1.2)
Total Protein: 4.9 g/dL — ABNORMAL LOW (ref 6.5–8.1)

## 2023-06-30 LAB — HEMOGLOBIN AND HEMATOCRIT, BLOOD
HCT: 23.8 % — ABNORMAL LOW (ref 39.0–52.0)
Hemoglobin: 7.4 g/dL — ABNORMAL LOW (ref 13.0–17.0)

## 2023-06-30 LAB — PHOSPHORUS: Phosphorus: 3 mg/dL (ref 2.5–4.6)

## 2023-06-30 LAB — MAGNESIUM: Magnesium: 1.7 mg/dL (ref 1.7–2.4)

## 2023-06-30 SURGERY — EGD (ESOPHAGOGASTRODUODENOSCOPY)
Anesthesia: Monitor Anesthesia Care | Laterality: Left

## 2023-06-30 MED ORDER — DEXMEDETOMIDINE HCL IN NACL 80 MCG/20ML IV SOLN
INTRAVENOUS | Status: DC | PRN
  Administered 2023-06-30 (×2): 4 ug via INTRAVENOUS

## 2023-06-30 MED ORDER — SODIUM CHLORIDE (PF) 0.9 % IJ SOLN
PREFILLED_SYRINGE | INTRAMUSCULAR | Status: DC | PRN
  Administered 2023-06-30: 1 mL

## 2023-06-30 MED ORDER — PROPOFOL 1000 MG/100ML IV EMUL
INTRAVENOUS | Status: AC
  Filled 2023-06-30: qty 100

## 2023-06-30 MED ORDER — LIDOCAINE HCL (PF) 2 % IJ SOLN
INTRAMUSCULAR | Status: DC | PRN
Start: 2023-06-30 — End: 2023-06-30
  Administered 2023-06-30: 120 mg via INTRADERMAL

## 2023-06-30 MED ORDER — HYDROMORPHONE HCL 2 MG PO TABS
4.0000 mg | ORAL_TABLET | ORAL | Status: DC | PRN
  Administered 2023-06-30 – 2023-07-06 (×28): 8 mg via ORAL
  Administered 2023-07-06 (×2): 4 mg via ORAL
  Administered 2023-07-07 – 2023-07-08 (×8): 8 mg via ORAL
  Administered 2023-07-08: 6 mg via ORAL
  Administered 2023-07-08 – 2023-07-09 (×4): 8 mg via ORAL
  Filled 2023-06-30 (×11): qty 4
  Filled 2023-06-30: qty 3
  Filled 2023-06-30 (×18): qty 4
  Filled 2023-06-30: qty 2
  Filled 2023-06-30: qty 4
  Filled 2023-06-30: qty 2
  Filled 2023-06-30 (×6): qty 4
  Filled 2023-06-30: qty 2
  Filled 2023-06-30 (×6): qty 4

## 2023-06-30 MED ORDER — PROPOFOL 500 MG/50ML IV EMUL
INTRAVENOUS | Status: DC | PRN
  Administered 2023-06-30: 175 ug/kg/min via INTRAVENOUS
  Administered 2023-06-30: 50 mg via INTRAVENOUS

## 2023-06-30 MED ORDER — HYDROMORPHONE HCL 1 MG/ML IJ SOLN
INTRAMUSCULAR | Status: DC | PRN
Start: 2023-06-30 — End: 2023-06-30
  Administered 2023-06-30 (×2): .5 mg via INTRAVENOUS

## 2023-06-30 MED ORDER — INSULIN ASPART 100 UNIT/ML IJ SOLN
0.0000 [IU] | Freq: Three times a day (TID) | INTRAMUSCULAR | Status: DC
  Administered 2023-06-30 – 2023-07-01 (×3): 2 [IU] via SUBCUTANEOUS
  Administered 2023-07-01 – 2023-07-02 (×4): 3 [IU] via SUBCUTANEOUS
  Administered 2023-07-02: 2 [IU] via SUBCUTANEOUS
  Administered 2023-07-03 – 2023-07-04 (×5): 3 [IU] via SUBCUTANEOUS
  Administered 2023-07-04 – 2023-07-05 (×2): 5 [IU] via SUBCUTANEOUS
  Administered 2023-07-05 (×3): 3 [IU] via SUBCUTANEOUS
  Administered 2023-07-06 (×2): 5 [IU] via SUBCUTANEOUS
  Administered 2023-07-07: 3 [IU] via SUBCUTANEOUS
  Administered 2023-07-07: 11 [IU] via SUBCUTANEOUS
  Administered 2023-07-07: 8 [IU] via SUBCUTANEOUS
  Administered 2023-07-07 – 2023-07-08 (×2): 5 [IU] via SUBCUTANEOUS
  Administered 2023-07-08 (×2): 8 [IU] via SUBCUTANEOUS
  Administered 2023-07-08 – 2023-07-09 (×2): 5 [IU] via SUBCUTANEOUS
  Administered 2023-07-09: 11 [IU] via SUBCUTANEOUS
  Administered 2023-07-09: 3 [IU] via SUBCUTANEOUS
  Administered 2023-07-09 – 2023-07-10 (×2): 5 [IU] via SUBCUTANEOUS
  Administered 2023-07-10: 11 [IU] via SUBCUTANEOUS
  Administered 2023-07-10 (×2): 8 [IU] via SUBCUTANEOUS
  Administered 2023-07-11: 2 [IU] via SUBCUTANEOUS
  Administered 2023-07-11: 5 [IU] via SUBCUTANEOUS

## 2023-06-30 MED ORDER — HYDROMORPHONE HCL 2 MG/ML IJ SOLN
INTRAMUSCULAR | Status: AC
  Filled 2023-06-30: qty 1

## 2023-06-30 MED ORDER — PHENYLEPHRINE HCL (PRESSORS) 10 MG/ML IV SOLN
INTRAVENOUS | Status: DC | PRN
  Administered 2023-06-30 (×3): 80 ug via INTRAVENOUS

## 2023-06-30 NOTE — Anesthesia Preprocedure Evaluation (Addendum)
 Anesthesia Evaluation  Patient identified by MRN, date of birth, ID band Patient awake    Reviewed: Allergy & Precautions, NPO status , Patient's Chart, lab work & pertinent test results, reviewed documented beta blocker date and time   History of Anesthesia Complications (+) PONV and history of anesthetic complications  Airway Mallampati: IV  TM Distance: >3 FB Neck ROM: Full    Dental  (+) Teeth Intact, Dental Advisory Given   Pulmonary asthma (used inhalers this AM)  Snores at night, negative sleep study a few years ago  Productive cough in preop, coughing up yellow sputum per pt   Pulmonary exam normal breath sounds clear to auscultation       Cardiovascular hypertension (148/86 preop), Pt. on medications and Pt. 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 S/P MV repair with mean gradient 6.5 mmHg and no MR.   2. Left ventricular ejection fraction, by estimation, is 60 to 65%. The  left ventricle has normal function. The left ventricle has no regional  wall motion abnormalities. Left ventricular diastolic parameters are  consistent with Grade I diastolic  dysfunction (impaired relaxation).   3. Right ventricular systolic function is normal. The right ventricular  size is normal.   4. The mitral valve has been repaired/replaced. No evidence of mitral  valve regurgitation. Mild mitral stenosis. There is a 28 mm Sorin Memo  annuloplasty ring present in the mitral position.   5. The aortic valve is tricuspid. Aortic valve regurgitation is not  visualized. No aortic stenosis is present.   6. Aortic dilatation noted. There is mild dilatation of the ascending  aorta, measuring 40 mm.   7. The inferior vena cava is normal in size with greater than 50%  respiratory variability, suggesting  right atrial pressure of 3 mmHg.      Neuro/Psych negative neurological ROS  negative psych ROS   GI/Hepatic hiatal hernia,,,history of colon cancer on FOLFOX   Endo/Other  diabetes, Well Controlled, Type 2, Insulin Dependent  FS 156 preop   Renal/GU Renal diseaseSolitary Kidney  negative genitourinary   Musculoskeletal  (+) Arthritis , Osteoarthritis,    Abdominal   Peds  Hematology  (+) Blood dyscrasia, anemia Hb 7.2, plt 54    Anesthesia Other Findings All: Empagliflozin  Reproductive/Obstetrics negative OB ROS                             Anesthesia Physical Anesthesia Plan  ASA: 4  Anesthesia Plan: MAC   Post-op Pain Management:    Induction:   PONV Risk Score and Plan: Treatment may vary due to age or medical condition, Propofol infusion and TIVA  Airway Management Planned: Natural Airway and Nasal Cannula  Additional Equipment: None  Intra-op Plan:   Post-operative Plan:   Informed Consent: I have reviewed the patients History and Physical, chart, labs and discussed the procedure including the risks, benefits and alternatives for the proposed anesthesia with the patient or authorized representative who has indicated his/her understanding and acceptance.     Dental advisory given  Plan Discussed with: CRNA and Surgeon  Anesthesia Plan Comments: (EGD for Hematochezia melena & Anemia)       Anesthesia Quick Evaluation

## 2023-06-30 NOTE — Anesthesia Postprocedure Evaluation (Signed)
 Anesthesia Post Note  Patient: [name]  Procedure(s) Performed: EGD (ESOPHAGOGASTRODUODENOSCOPY) (Left) COLONOSCOPY (Left) CONTROL OF HEMORRHAGE, GI TRACT, ENDOSCOPIC, BY CLIPPING OR OVERSEWING SCLEROTHERAPY     Patient location during evaluation: PACU Anesthesia Type: MAC Level of consciousness: awake and alert Pain management: pain level controlled Vital Signs Assessment: post-procedure vital signs reviewed and stable Respiratory status: spontaneous breathing, nonlabored ventilation and respiratory function stable Cardiovascular status: blood pressure returned to baseline and stable Postop Assessment: no apparent nausea or vomiting Anesthetic complications: no   No notable events documented.  Last Vitals:  Vitals:   06/30/23 1420 06/30/23 1438  BP: 128/83 130/80  Pulse: 93 93  Resp: 17 16  Temp:  36.7 C  SpO2: 100% 95%    Last Pain:  Vitals:   06/30/23 1438  TempSrc: Oral  PainSc:                  [name]

## 2023-06-30 NOTE — Interval H&P Note (Signed)
 History and Physical Interval Note:  06/30/2023 12:51 PM  [name]  has presented today for surgery, with the diagnosis of hematochezia, melena, anemia.  The various methods of treatment have been discussed with the patient and family. After consideration of risks, benefits and other options for treatment, the patient has consented to  Procedure(s): EGD (ESOPHAGOGASTRODUODENOSCOPY) (Left) COLONOSCOPY (Left) as a surgical intervention.  The patient's history has been reviewed, patient examined, no change in status, stable for surgery.  I have reviewed the patient's chart and labs.  Questions were answered to the patient's satisfaction.     Freddy Jaksch

## 2023-06-30 NOTE — Transfer of Care (Signed)
 Immediate Anesthesia Transfer of Care Note  Patient: [name]  Procedure(s) Performed: Procedure(s): EGD (ESOPHAGOGASTRODUODENOSCOPY) (Left) COLONOSCOPY (Left) CONTROL OF HEMORRHAGE, GI TRACT, ENDOSCOPIC, BY CLIPPING OR OVERSEWING SCLEROTHERAPY  Patient Location: PACU  Anesthesia Type:MAC  Level of Consciousness: Patient easily awoken, comfortable, cooperative, following commands, responds to stimulation.   Airway & Oxygen Therapy: Patient spontaneously breathing, ventilating well, oxygen via simple oxygen mask.  Post-op Assessment: Report given to ENDO RN, vital signs reviewed and stable, moving all extremities.   Post vital signs: Reviewed and stable.  Complications: No apparent anesthesia complications  Vitals Value Taken Time  BP 91/46 06/30/23 1347  Temp    Pulse 78 06/30/23 1347  Resp 17 06/30/23 1347  SpO2 100 % 06/30/23 1347  Vitals shown include unfiled device data.  Last Pain:  Vitals:   06/30/23 1240  TempSrc: Temporal  PainSc: 9          Complications: No notable events documented.

## 2023-06-30 NOTE — Op Note (Signed)
 [facility] Patient Name: [name] Procedure Date: 06/30/2023 MRN: [id number] Attending MD: Willis Modena , MD, [id number] Date of Birth: [birth year] CSN: [id number] Age: 58 Admit Type: Inpatient Procedure:                Upper GI endoscopy Indications:              Hematochezia, Melena Providers:                Willis Modena, MD, Marge Duncans, RN, Rozetta Nunnery, Technician Referring MD:             [facility] Medicines:                Monitored Anesthesia Care Complications:            No immediate complications. Estimated Blood Loss:     Estimated blood loss: none. Procedure:                Pre-Anesthesia Assessment:                           - Prior to the procedure, a History and Physical                            was performed, and patient medications and                            allergies were reviewed. The patient's tolerance of                            previous anesthesia was also reviewed. The risks                            and benefits of the procedure and the sedation                            options and risks were discussed with the patient.                            All questions were answered, and informed consent                            was obtained. Prior Anticoagulants: The patient has                            taken no anticoagulant or antiplatelet agents. ASA                            Grade Assessment: IV - A patient with severe                            systemic disease that is a constant threat to life.  After reviewing the risks and benefits, the patient                            was deemed in satisfactory condition to undergo the                            procedure.                           After obtaining informed consent, the endoscope was                            passed under direct vision. Throughout the                            procedure, the  patient's blood pressure, pulse, and                            oxygen saturations were monitored continuously. The                            [id number]-[id number] ([id number]) Olympus endoscope was introduced                            through the mouth, and advanced to the second part                            of duodenum. The upper GI endoscopy was                            accomplished without difficulty. The patient                            tolerated the procedure well. Scope In: Scope Out: Findings:      LA Grade B (one or more mucosal breaks greater than 5 mm, not extending       between the tops of two mucosal folds) esophagitis with no bleeding was       found.      The exam of the esophagus was otherwise normal.      The entire examined stomach was normal.      Diffuse moderately congested mucosa without active bleeding and with no       stigmata of bleeding was found in the first portion of the duodenum and       in the second portion of the duodenum.      A single 6 mm mucosal nodule with a localized distribution was found in       the duodenal bulb.      The exam of the duodenum was otherwise normal.      No old or fresh blood was seen to the extent of our examination. Impression:               - LA Grade B reflux esophagitis with no bleeding.                           -  Normal stomach.                           - Congested duodenal mucosa.                           - Mucosal nodule found in the duodenum.                           - No specimens collected. Moderate Sedation:      Not Applicable - Patient had care per Anesthesia. Recommendation:           - PPI.                           - Perform a colonoscopy today. Procedure Code(s):        --- Professional ---                           [phone], Esophagogastroduodenoscopy, flexible,                            transoral; diagnostic, including collection of                            specimen(s) by brushing or washing, when  performed                            (separate procedure) Diagnosis Code(s):        --- Professional ---                           K21.00, Gastro-esophageal reflux disease with                            esophagitis, without bleeding                           K31.89, Other diseases of stomach and duodenum                           K92.1, Melena (includes Hematochezia) CPT copyright 2022 [employer]. All rights reserved. The codes documented in this report are preliminary and upon coder review may  be revised to meet current compliance requirements. Willis Modena, MD 06/30/2023 1:55:44 PM This report has been signed electronically. Number of Addenda: 0

## 2023-06-30 NOTE — Anesthesia Procedure Notes (Signed)
 Procedure Name: MAC Date/Time: 06/30/2023 12:59 PM  Performed by: Ludwig Lean, CRNAPre-anesthesia Checklist: Patient identified, Emergency Drugs available, Suction available and Patient being monitored Patient Re-evaluated:Patient Re-evaluated prior to induction Oxygen Delivery Method: Simple face mask Placement Confirmation: positive ETCO2 and breath sounds checked- equal and bilateral

## 2023-06-30 NOTE — Op Note (Signed)
 [facility] Patient Name: [name] Procedure Date: 06/30/2023 MRN: [id number] Attending MD: Willis Modena , MD, [id number] Date of Birth: 01-17-1966 CSN: [id number] Age: 58 Admit Type: Inpatient Procedure:                Colonoscopy Indications:              Hematochezia Providers:                Willis Modena, MD, Marge Duncans, RN, Rozetta Nunnery, Technician Referring MD:             [facility] Medicines:                Monitored Anesthesia Care Complications:            No immediate complications. Estimated Blood Loss:     Estimated blood loss: none. Procedure:                Pre-Anesthesia Assessment:                           - Prior to the procedure, a History and Physical                            was performed, and patient medications and                            allergies were reviewed. The patient's tolerance of                            previous anesthesia was also reviewed. The risks                            and benefits of the procedure and the sedation                            options and risks were discussed with the patient.                            All questions were answered, and informed consent                            was obtained. Prior Anticoagulants: The patient has                            taken no anticoagulant or antiplatelet agents. ASA                            Grade Assessment: IV - A patient with severe                            systemic disease that is a constant threat to life.  After reviewing the risks and benefits, the patient                            was deemed in satisfactory condition to undergo the                            procedure.                           - Prior to the procedure, a History and Physical                            was performed, and patient medications and                            allergies were reviewed. The patient's  tolerance of                            previous anesthesia was also reviewed. The risks                            and benefits of the procedure and the sedation                            options and risks were discussed with the patient.                            All questions were answered, and informed consent                            was obtained. Prior Anticoagulants: The patient has                            taken Pradaxa (dabigatran), last dose was 3 days                            prior to procedure. ASA Grade Assessment: IV - A                            patient with severe systemic disease that is a                            constant threat to life. After reviewing the risks                            and benefits, the patient was deemed in                            satisfactory condition to undergo the procedure.                           After obtaining informed consent, the colonoscope  was passed under direct vision. Throughout the                            procedure, the patient's blood pressure, pulse, and                            oxygen saturations were monitored continuously. The                            [id number]-HQ190L ([id number] Olympus colonoscope was                            introduced through the anus and advanced to the the                            splenic flexure for evaluation. This was the                            intended extent. The rectum was photographed. The                            colonoscopy was performed without difficulty. The                            patient tolerated the procedure well. The quality                            of the bowel preparation was inadequate. Scope In: 1:17:49 PM Scope Out: 1:36:39 PM Total Procedure Duration: 0 hours 18 minutes 50 seconds  Findings:      The perianal and digital rectal examinations were normal.      A large amount of stool was found in the sigmoid colon, in the        descending colon and at the splenic flexure, making visualization       difficult. Elected not to complete colonoscopy given prep quality and       likely having found bleeding site.      A continuous area of bleeding ulcerated mucosa with stigmata of recent       bleeding was present in the distal descending colon. Visible vessel with       oozing seen. Area around ulcer was successfully injected with 1 mL of a       0.1 mg/mL solution of epinephrine for hemostasis. For hemostasis, one       hemostatic clip was successfully placed. There was no bleeding at the       end of the procedure.      The exam was otherwise without abnormality. Impression:               - Preparation of the colon was inadequate.                           - Stool in the sigmoid colon, in the descending                            colon and at the splenic flexure.                           -  Mucosal ulceration in the distal descending                            colon. Visible vessel with oozing, endoscopically                            treated as above.                           - The examination was otherwise normal. Moderate Sedation:      None Recommendation:           - Return patient to hospital ward for ongoing care.                           - Clear liquid diet today.                           - Continue present medications.                           - Hold anticoagulation for at least another week,                            if clinically feasible.                           [facility] will follow. Procedure Code(s):        --- Professional ---                           [id number], 52, Colonoscopy, flexible; with control of                            bleeding, any method Diagnosis Code(s):        --- Professional ---                           K63.3, Ulcer of intestine                           K92.1, Melena (includes Hematochezia) CPT copyright 2022 [employer]. All rights reserved. The codes  documented in this report are preliminary and upon coder review may  be revised to meet current compliance requirements. Willis Modena, MD 06/30/2023 2:01:39 PM This report has been signed electronically. Number of Addenda: 0

## 2023-06-30 NOTE — Progress Notes (Signed)
 PROGRESS NOTE    [name]  WUJ:[id number] DOB: 1966-03-10 DOA: 06/28/2023 PCP: Joycelyn Rua, MD    Brief Narrative:   58 year old with history of colon cancer on FOLFOX, Avastin follows Dr [name], insulin-dependent DM2, HTN, HLD, DVT on Pradaxa admitted to the hospital for bright red blood per rectum/acute blood loss anemia.  Recently treated for gluteal fold cellulitis.  Last dose of Pradaxa was the night before his hospitalization.  Admission hemoglobin was noted to be 6.5 therefore PRBC transfusion was ordered.  CT angio was negative for active bleeding but showed colonic wall thickening distal to prior anastomosis.  Plans for EGD and C scope today.  Assessment & Plan:  Principal Problem:   Lower GI bleed   Bright red blood per rectum Acute blood loss anemia Acute on chronic anemia and thrombocytopenia - Unclear exact source as this could be diverticular versus malignancy versus ischemic versus AVM. -Baseline hemoglobin 8.0, admission hemoglobin 6.5, received 2 units of PRBC.  Hemoglobin 7.2 this morning and has had further bleeding therefore will order additional unit of PRBC. - EKG Colon today -PPI BID  Colon cancer, metastatic Anemia and thrombocytopenia, bone marrow suppression from chemo - On outpatient FOLFOX and Avastin.  Follows with [name].  Insulin-dependent diabetes mellitus type 2 - On Semglee, Accu-Cheks and sliding scale.  Will further adjust as necessary -Holding multiple p.o. medications -Hold aspirin  Hypocalcemia - Repletion  Hyperlipidemia - Lipitor  Essential hypertension - Toprol-XL.  IV as needed -Currently holding lisinopril  History of DVT - Currently Pradaxa on hold  DVT prophylaxis: SCDs Start: 06/28/23 1229    Code Status: Full Code Family Communication:   Status is: Inpatient Remains inpatient appropriate because: On going eval for GI bleed.     Subjective:  Still having frequent maroon and bright red  stools Awaiting colonoscopy/endoscopy today  Examination:  General exam: Appears calm and comfortable, ill Respiratory system: Clear to auscultation. Respiratory effort normal. Cardiovascular system: S1 & S2 heard, RRR. No JVD, murmurs, rubs, gallops or clicks. No pedal edema. Gastrointestinal system: Abdomen is nondistended, soft and nontender. No organomegaly or masses felt. Normal bowel sounds heard. Central nervous system: Alert and oriented. No focal neurological deficits. Extremities: Symmetric 5 x 5 power. Skin: No rashes, lesions or ulcers Psychiatry: Judgement and insight appear normal. Mood & affect appropriate.                Diet Orders (From admission, onward)     Start     Ordered   06/30/23 1000  Diet NPO time specified  Diet effective midnight        06/29/23 1401            Objective: Vitals:   06/29/23 2045 06/30/23 0435 06/30/23 0806 06/30/23 1209  BP:  (!) 143/89  133/87  Pulse: (!) 102 (!) 107  (!) 106  Resp:  17  16  Temp:  97.9 F (36.6 C)  98.7 F (37.1 C)  TempSrc:      SpO2:  97% 98% 98%  Weight:      Height:        Intake/Output Summary (Last 24 hours) at 06/30/2023 1233 Last data filed at 06/30/2023 1200 Gross per 24 hour  Intake 3620.14 ml  Output 1300 ml  Net 2320.14 ml   Filed Weights   06/28/23 0929  Weight: 81.6 kg    Scheduled Meds:  [MAR Hold] sodium chloride   Intravenous Once   [MAR Hold] atorvastatin  10  mg Oral Daily   [MAR Hold] budesonide  0.25 mg Nebulization BID   [MAR Hold] Chlorhexidine Gluconate Cloth  6 each Topical Daily   [MAR Hold] insulin aspart  0-15 Units Subcutaneous Q4H   [MAR Hold] insulin glargine-yfgn  20 Units Subcutaneous Daily   [MAR Hold] metoprolol succinate  25 mg Oral Daily   [MAR Hold] morphine  60 mg Oral Q12H   [MAR Hold] pantoprazole (PROTONIX) IV  40 mg Intravenous Q12H   Continuous Infusions:  sodium chloride 75 mL/hr at 06/29/23 2112   sodium chloride      Nutritional  status     Body mass index is 25.1 kg/m.  Data Reviewed:   CBC: Recent Labs  Lab 06/23/23 1558 06/23/23 1558 06/28/23 1000 06/28/23 1008 06/29/23 0346 06/29/23 1544 06/29/23 2328 06/30/23 0455  WBC 6.0  --  4.7  --   --   --   --  4.6  NEUTROABS 3.9  --  2.6  --   --   --   --   --   HGB 6.2*   < > 6.5* 6.5* 7.2* 6.7* 7.5* 7.2*  HCT 20.1*  --  21.0* 19.0* 22.4* 21.7* 23.5* 22.4*  MCV 104.1*  --  101.0*  --   --   --   --  98.7  PLT 70*  --  74*  --   --   --   --  54*   < > = values in this interval not displayed.   Basic Metabolic Panel: Recent Labs  Lab 06/23/23 1558 06/28/23 1000 06/28/23 1008 06/30/23 0455  NA 128* 129* 130* 132*  K 5.1 5.0 4.8 3.9  CL 95* 99 99 105  CO2 23 20*  --  18*  GLUCOSE 203* 251* 242* 152*  BUN 21* 24* 21* 17  CREATININE 0.66 0.62 0.60* 0.60*  CALCIUM 7.9* 7.6*  --  7.3*  MG  --   --   --  1.7  PHOS  --   --   --  3.0   GFR: Estimated Creatinine Clearance: 109.8 mL/min (A) (by C-G formula based on SCr of 0.6 mg/dL (L)). Liver Function Tests: Recent Labs  Lab 06/23/23 1558 06/28/23 1000 06/30/23 0455  AST 29 41 36  ALT 11 18 15   ALKPHOS 483* 676* 507*  BILITOT 0.7 0.9 2.1*  PROT 5.8* 5.5* 4.9*  ALBUMIN 3.2* 2.0* 1.9*   No results for input(s): "LIPASE", "AMYLASE" in the last 168 hours. No results for input(s): "AMMONIA" in the last 168 hours. Coagulation Profile: No results for input(s): "INR", "PROTIME" in the last 168 hours. Cardiac Enzymes: No results for input(s): "CKTOTAL", "CKMB", "CKMBINDEX", "TROPONINI" in the last 168 hours. BNP (last 3 results) No results for input(s): "PROBNP" in the last 8760 hours. HbA1C: No results for input(s): "HGBA1C" in the last 72 hours. CBG: Recent Labs  Lab 06/29/23 2047 06/30/23 0019 06/30/23 0334 06/30/23 0726 06/30/23 1150  GLUCAP 123* 115* 141* 149* 156*   Lipid Profile: No results for input(s): "CHOL", "HDL", "LDLCALC", "TRIG", "CHOLHDL", "LDLDIRECT" in the last 72  hours. Thyroid Function Tests: No results for input(s): "TSH", "T4TOTAL", "FREET4", "T3FREE", "THYROIDAB" in the last 72 hours. Anemia Panel: No results for input(s): "VITAMINB12", "FOLATE", "FERRITIN", "TIBC", "IRON", "RETICCTPCT" in the last 72 hours. Sepsis Labs: No results for input(s): "PROCALCITON", "LATICACIDVEN" in the last 168 hours.  No results found for this or any previous visit (from the past 240 hours).       Radiology  Studies: No results found.         LOS: 2 days   Time spent= 35 mins    Miguel Rota, MD [facility]  If 7PM-7AM, please contact night-coverage  06/30/2023, 12:33 PM

## 2023-06-30 NOTE — Progress Notes (Addendum)
 HENRIQUE PAREKH   DOB:1965/04/15   WU#:[id number]      ASSESSMENT & PLAN:  Adenocarcinoma of colon, metastatic - Moderate to poorly differentiated - Diagnosed 01/07/2019 - Status post left colectomy 03/04/2019.  Status post FOLFOX chemotherapy from 2022 2021 x 12 cycles, completed 08/22/2019. - Subsequently started on chemotherapy regimen FOLFIRI/bevacizumab on 05/26/2022.  Completed cycle 24 on 05/06/2023. - Started on cycle 1 FOLFOX/Avastin on 06/11/2023 - Medical oncology/[name] following.  Last seen in outpatient oncology office on 06/10/2023 at which time chemotherapy was held due to severe anemia. - Continue discussion regarding goals of care and CODE STATUS with Mr. Tinnon and his wife    Acute rectal bleeding Acute blood loss anemia Acute on chronic anemia - Low hemoglobin likely related to lower GI bleed - Hemoglobin low 7.2 today.  Status post PRBC transfusion for hemoglobin 6.7. - Awaiting endoscopy and colonoscopy, currently n.p.o. - Transfuse PRBC for Hgb <7.0 - Continue to monitor CBC with differential closely   Severe pain Bilateral lower extremity weakness - Well-controlled today with pain medication regimen - Secondary to extensive bone mets - Continue judicious pain management - PT evaluation   Diabetes Hypertension - Continue monitoring blood sugar levels and insulin per sliding scale - Continue antihypertensives as ordered - Monitor closely      Code Status Full  Subjective:  Patient seen awake alert and oriented x 3 laying in bed.  Continues to have bloody bowel movements.  Endoscopy and colonoscopy therapy procedures were not done yesterday, however scheduled for today therefore patient remains n.p.o.  Reports he feels a little anxious waiting for procedures.  No other acute complaints offered.  Objective:  Vitals:   06/30/23 0435 06/30/23 0806  BP: (!) 143/89   Pulse: (!) 107   Resp: 17   Temp: 97.9 F (36.6 C)   SpO2: 97% 98%      Intake/Output Summary (Last 24 hours) at 06/30/2023 1120 Last data filed at 06/30/2023 1054 Gross per 24 hour  Intake 3620.14 ml  Output 1300 ml  Net 2320.14 ml     REVIEW OF SYSTEMS:   Constitutional: + Bilateral lower extremity weakness, denies fevers, chills or abnormal night sweats Eyes: Denies blurriness of vision, double vision or watery eyes Ears, nose, mouth, throat, and face: Denies mucositis or sore throat Respiratory: Denies cough, dyspnea or wheezes Cardiovascular: Denies palpitation, chest discomfort or lower extremity swelling Gastrointestinal: + Rectal bleeding Skin: Denies abnormal skin rashes Lymphatics: Denies new lymphadenopathy or easy bruising Neurological: Denies numbness, tingling or new weaknesses Behavioral/Psych: Mood is stable, no new changes  All other systems were reviewed with the patient and are negative.  PHYSICAL EXAMINATION: ECOG PERFORMANCE STATUS: 2 - Symptomatic, <50% confined to bed  Vitals:   06/30/23 0435 06/30/23 0806  BP: (!) 143/89   Pulse: (!) 107   Resp: 17   Temp: 97.9 F (36.6 C)   SpO2: 97% 98%   Filed Weights   06/28/23 0929  Weight: 180 lb (81.6 kg)    GENERAL: alert, + mild distress and comfortable SKIN: + Pale skin color, texture, turgor are normal, no rashes or significant lesions EYES: normal, conjunctiva are pink and non-injected, sclera clear OROPHARYNX: no exudate, no erythema and lips, buccal mucosa, and tongue normal  NECK: supple, thyroid normal size, non-tender, without nodularity LYMPH: no palpable lymphadenopathy in the cervical, axillary or inguinal LUNGS: clear to auscultation and percussion with normal breathing effort HEART: regular rate & rhythm and no murmurs and no lower extremity  edema ABDOMEN: abdomen soft, non-tender and normal bowel sounds MUSCULOSKELETAL: no cyanosis of digits and no clubbing, tender at the right anterior iliac, left anterior costal margin, and right shoulder PSYCH: alert &  oriented x 3 with fluent speech NEURO: no focal motor/sensory deficits   All questions were answered. The patient knows to call the clinic with any problems, questions or concerns.   The total time spent in the appointment was 30 minutes encounter with patient including review of chart and various tests results, discussions about plan of care and coordination of care plan  [name], NP 06/30/2023 11:20 AM    Labs Reviewed:  Lab Results  Component Value Date   WBC 4.6 06/30/2023   HGB 7.2 (L) 06/30/2023   HCT 22.4 (L) 06/30/2023   MCV 98.7 06/30/2023   PLT 54 (L) 06/30/2023   Blood smear: The platelets appear decreased in number.  No platelet clumps.  The polychromasia is increased.  Few teardrops and nucleated red cells.  There are a few myelocytes.  The white cell morphology is otherwise unremarkable  Recent Labs    06/23/23 1558 06/28/23 1000 06/28/23 1008 06/30/23 0455  NA 128* 129* 130* 132*  K 5.1 5.0 4.8 3.9  CL 95* 99 99 105  CO2 23 20*  --  18*  GLUCOSE 203* 251* 242* 152*  BUN 21* 24* 21* 17  CREATININE 0.66 0.62 0.60* 0.60*  CALCIUM 7.9* 7.6*  --  7.3*  GFRNONAA >60 >60  --  >60  PROT 5.8* 5.5*  --  4.9*  ALBUMIN 3.2* 2.0*  --  1.9*  AST 29 41  --  36  ALT 11 18  --  15  ALKPHOS 483* 676*  --  507*  BILITOT 0.7 0.9  --  2.1*    Studies Reviewed:  CT ANGIO GI BLEED Result Date: 06/28/2023 CLINICAL DATA:  Lower GI bleeding. Mesenteric ischemia, acute. Metastatic colon cancer EXAM: CTA ABDOMEN AND PELVIS WITHOUT AND WITH CONTRAST TECHNIQUE: Multidetector CT imaging of the abdomen and pelvis was performed using the standard protocol during bolus administration of intravenous contrast. Multiplanar reconstructed images and MIPs were obtained and reviewed to evaluate the vascular anatomy. RADIATION DOSE REDUCTION: This exam was performed according to the departmental dose-optimization program which includes automated exposure control, adjustment of the mA and/or  kV according to patient size and/or use of iterative reconstruction technique. CONTRAST:  80mL OMNIPAQUE IOHEXOL 350 MG/ML SOLN COMPARISON:  06/04/2023 FINDINGS: VASCULAR Aorta: Normal caliber aorta without aneurysm, dissection, vasculitis or significant stenosis. Mild atherosclerosis. Celiac: Patent without evidence of aneurysm, dissection, vasculitis or significant stenosis. SMA: Patent without evidence of aneurysm, dissection, vasculitis or significant stenosis. Renals: The right renal artery is patent without evidence of aneurysm, dissection, vasculitis, fibromuscular dysplasia or significant stenosis. Patient has had a left nephrectomy. IMA: The origin and proximal aspect of the inferior mesenteric artery is not identified. This does appear to be reconstituted by branches of the superior mesenteric artery. Inflow: Patent without evidence of aneurysm, dissection, vasculitis or significant stenosis. Proximal Outflow: Bilateral common femoral and visualized portions of the superficial and profunda femoral arteries are patent without evidence of aneurysm, dissection, vasculitis or significant stenosis. Veins: Major venous structures are patent. Review of the MIP images confirms the above findings. NON-VASCULAR Lower chest: Small layering left pleural effusion. Hepatobiliary: 1.1 cm hypoattenuating lesion in the periphery of the right lower lobe, not definitively seen on the prior study (series 18, image 29. Previously described subtly hypoattenuating lesions within  the liver at the gallbladder fossa and IVC are difficult to characterize but do not appear appreciably changed. Atrophy of the left hepatic lobe. Right hepatic lobe measures 22 cm in length. Small stone within the gallbladder. No biliary dilatation. Pancreas: Grossly unremarkable. Spleen: Grossly unchanged appearance of the spleen with multiple heterogeneously areas of hypoattenuation including dominant lesion measuring approximately 11.2 x 8.5 cm. This  has been previously characterized as a probable benign hamartoma. Adrenals/Urinary Tract: Thickened appearance of the adrenal glands. Prior left nephrectomy. Unremarkable right kidney. No stone or hydronephrosis. Unremarkable urinary bladder. Stomach/Bowel: Tiny hiatal hernia. Stomach otherwise within normal limits. No abnormally dilated loops of bowel. Postsurgical changes from prior sigmoid resection and reanastomosis abnormal colonic wall thickening in the region of the distal sigmoid colon (series 18, image 66). There is also focal colonic wall thickening just distal to the anastomotic site (series 18, image 57). Normal appendix in the right lower quadrant (series 18, image 53). No evidence of abnormal contrast accumulation within the bowel on multiphasic imaging to localize site of active gastrointestinal bleeding. Lymphatic: Mildly prominent left external iliac chain lymph node measuring 9 mm (series 18, image 59), unchanged. No new or enlarging abdominopelvic lymph nodes. Reproductive: Prostatomegaly. Other: No ascites or pneumoperitoneum. Musculoskeletal: Extensive, widespread osseous metastatic disease, without evidence of significant progression compared to the prior study. Scoliotic spinal curvature. No pathologic fractures are seen. IMPRESSION: 1. The origin and proximal aspect of the inferior mesenteric artery is not identified. This does appear to be reconstituted by branches of the superior mesenteric artery. Otherwise, no significant vascular abnormality. 2. No evidence of abnormal contrast accumulation within the bowel to localize site of active gastrointestinal bleeding. 3. Postsurgical changes from prior sigmoid resection and reanastomosis. Abnormal colonic wall thickening in the region of the distal sigmoid colon and just distal to the anastomotic site. Findings may represent sites of focal colitis, however recurrent malignancy is not excluded. Follow-up colonoscopy should be considered following  the resolution of patient's acute symptoms. 4. New 1.1 cm hypoattenuating lesion in the periphery of the right hepatic lobe, not definitively seen on the prior study. This is concerning for a new site of metastatic disease. 5. Extensive, widespread osseous metastatic disease, without evidence of significant progression compared to the prior study. 6. Small layering left pleural effusion. 7. Cholelithiasis. 8. Aortic atherosclerosis. Electronically Signed   By: Duanne Guess D.O.   On: 06/28/2023 11:18   CT ABDOMEN PELVIS W CONTRAST Result Date: 06/04/2023 CLINICAL DATA:  Acute generalized abdominal pain. History of metastatic colon cancer. EXAM: CT ABDOMEN AND PELVIS WITH CONTRAST TECHNIQUE: Multidetector CT imaging of the abdomen and pelvis was performed using the standard protocol following bolus administration of intravenous contrast. RADIATION DOSE REDUCTION: This exam was performed according to the departmental dose-optimization program which includes automated exposure control, adjustment of the mA and/or kV according to patient size and/or use of iterative reconstruction technique. CONTRAST:  OMNIPAQUE IOHEXOL 300 MG/ML  SOLN COMPARISON:  April 04, 2023.  May 09, 2022. FINDINGS: Lower chest: No acute abnormality. Hepatobiliary: Small gallstone. No biliary dilatation. Liver is unremarkable. Pancreas: Unremarkable. No pancreatic ductal dilatation or surrounding inflammatory changes. Spleen: 11 x 9.1 cm complex lesion seen in the spleen as described on prior exam which may represent hamartoma as noted on prior studies. Adrenals/Urinary Tract: Adrenal glands are unremarkable. Status post left nephrectomy. No hydronephrosis or renal obstruction. Urinary bladder is unremarkable. Stomach/Bowel: Stomach is unremarkable. There is no evidence of bowel obstruction or inflammation. Appendix appears  normal. Vascular/Lymphatic: Aortic atherosclerosis. No enlarged abdominal or pelvic lymph nodes.  Reproductive: Prostate is unremarkable. Other: No ascites or hernia is noted. Musculoskeletal: Grossly stable sclerotic densities noted throughout skeleton consistent with osseous metastases. IMPRESSION: Small gallstone. 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 Grossly stable diffuse osseous sclerotic densities consistent with osseous metastases. Aortic Atherosclerosis (ICD10-I70.0). Electronically Signed   By: Lupita Raider M.D.   On: 06/04/2023 19:14  Mr. Vecchio was interviewed and examined.  He continues to have rectal bleeding.  He is scheduled for endoscopic evaluation today. He reports persistent pain in the back, pelvis, and right shoulder.  The pain is relieved partially with the current narcotic regimen.  MS Contin was increased yesterday.  I will increase the oral Dilaudid dose.  We discussed CPR and ACLS again today.  He would like to be placed on a no CODE BLUE status, but reports his wife not ready to make this change.  He understands the poor prognosis.  He requested I contact his wife.  I attempted to contact her at approximately 7 AM and again at 1:30 PM and she did not answer.  He has persistent thrombocytopenia.  I reviewed the peripheral blood smear today.  I do not see platelet clumping.  Thrombocytopenia is likely related to metastatic tumor involving the bone marrow and chemotherapy.  Recommendations: Continue Red cell transfusion support Evaluation of GI bleeding per gastroenterology Narcotic analgesics-I increased the breakthrough Dilaudid dose Continue discussions with Mr. Mccarry and his wife regarding CODE STATUS, I will attempt to contact his wife again today Oncology will continue following him in the hospital and outpatient follow-up will be scheduled at the Cancer center  Addendum: I spoke to Ms. Withey at approximately 3:30 PM.  We discussed his current status and treatment plan.  We discussed CPR/ACLS.  I told her Mr. Parco indicated he wishes to be  placed on a CODE BLUE status.  She does not agree with this and would like to have further discussion with Mr. Heffern prior to further CODE STATUS decisions.

## 2023-07-01 ENCOUNTER — Encounter

## 2023-07-01 ENCOUNTER — Encounter (HOSPITAL_COMMUNITY): Payer: Self-pay | Admitting: Gastroenterology

## 2023-07-01 DIAGNOSIS — K922 Gastrointestinal hemorrhage, unspecified: Secondary | ICD-10-CM | POA: Diagnosis not present

## 2023-07-01 LAB — COMPREHENSIVE METABOLIC PANEL WITH GFR
ALT: 15 U/L (ref 0–44)
AST: 44 U/L — ABNORMAL HIGH (ref 15–41)
Albumin: 1.9 g/dL — ABNORMAL LOW (ref 3.5–5.0)
Alkaline Phosphatase: 465 U/L — ABNORMAL HIGH (ref 38–126)
Anion gap: 8 (ref 5–15)
BUN: 16 mg/dL (ref 6–20)
CO2: 20 mmol/L — ABNORMAL LOW (ref 22–32)
Calcium: 7.6 mg/dL — ABNORMAL LOW (ref 8.9–10.3)
Chloride: 105 mmol/L (ref 98–111)
Creatinine, Ser: 0.62 mg/dL (ref 0.61–1.24)
GFR, Estimated: >60 mL/min (ref >60)
Glucose, Bld: 80 mg/dL (ref 70–99)
Potassium: 4 mmol/L (ref 3.5–5.1)
Sodium: 133 mmol/L — ABNORMAL LOW (ref 135–145)
Total Bilirubin: 2.7 mg/dL — ABNORMAL HIGH (ref 0.0–1.2)
Total Protein: 5.2 g/dL — ABNORMAL LOW (ref 6.5–8.1)

## 2023-07-01 LAB — GLUCOSE, CAPILLARY
Glucose-Capillary: 140 mg/dL — ABNORMAL HIGH (ref 70–99)
Glucose-Capillary: 175 mg/dL — ABNORMAL HIGH (ref 70–99)
Glucose-Capillary: 145 mg/dL — ABNORMAL HIGH (ref 70–99)
Glucose-Capillary: 118 mg/dL — ABNORMAL HIGH (ref 70–99)

## 2023-07-01 LAB — CBC
HCT: 23.8 % — ABNORMAL LOW (ref 39.0–52.0)
Hemoglobin: 7.4 g/dL — ABNORMAL LOW (ref 13.0–17.0)
MCH: 31.2 pg (ref 26.0–34.0)
MCHC: 31.1 g/dL (ref 30.0–36.0)
MCV: 100.4 fL — ABNORMAL HIGH (ref 80.0–100.0)
Platelets: 49 10*3/uL — ABNORMAL LOW (ref 150–400)
RBC: 2.37 MIL/uL — ABNORMAL LOW (ref 4.22–5.81)
RDW: 20.6 % — ABNORMAL HIGH (ref 11.5–15.5)
WBC: 5.1 10*3/uL (ref 4.0–10.5)
nRBC: 9.1 % — ABNORMAL HIGH (ref 0.0–0.2)

## 2023-07-01 LAB — HEMOGLOBIN AND HEMATOCRIT, BLOOD
HCT: 26.7 % — ABNORMAL LOW (ref 39.0–52.0)
Hemoglobin: 8.2 g/dL — ABNORMAL LOW (ref 13.0–17.0)

## 2023-07-01 LAB — MAGNESIUM: Magnesium: 1.8 mg/dL (ref 1.7–2.4)

## 2023-07-01 LAB — PREPARE RBC (CROSSMATCH)

## 2023-07-01 MED ORDER — SODIUM CHLORIDE 0.9% IV SOLUTION: Freq: Once | INTRAVENOUS | Status: AC

## 2023-07-01 NOTE — Progress Notes (Signed)
 Subjective: No further bleeding. Back pain ongoing.  Objective: Vital signs in last 24 hours: Temp:  [98 F (36.7 C)-99.7 F (37.6 C)] 99.7 F (37.6 C) (04/02 0900) Pulse Rate:  [79-115] 105 (04/02 0900) Resp:  [16-20] 20 (04/02 0900) BP: (89-156)/(51-87) 149/87 (04/02 0900) SpO2:  [92 %-100 %] 92 % (04/02 0900) Weight change:  Last BM Date : 06/29/23 (per pt)  PE: GEN:  NAD SKIN:  Pale NEURO:  No encephalopathy ABD:  Soft, non-tender  Lab Results: CBC    Component Value Date/Time   WBC 5.1 07/01/2023 0450   RBC 2.37 (L) 07/01/2023 0450   HGB 7.4 (L) 07/01/2023 0450   HGB 6.2 (LL) 06/23/2023 1558   HGB 13.9 02/08/2020 1128   HCT 23.8 (L) 07/01/2023 0450   HCT 40.9 02/08/2020 1128   PLT 49 (L) 07/01/2023 0450   PLT 70 (L) 06/23/2023 1558   PLT 114 (L) 02/08/2020 1128   MCV 100.4 (H) 07/01/2023 0450   MCV 97 02/08/2020 1128   MCH 31.2 07/01/2023 0450   MCHC 31.1 07/01/2023 0450   RDW 20.6 (H) 07/01/2023 0450   RDW 13.5 02/08/2020 1128   LYMPHSABS 0.6 (L) 06/28/2023 1000   MONOABS 1.0 06/28/2023 1000   EOSABS 0.1 06/28/2023 1000   BASOSABS 0.0 06/28/2023 1000  CMP     Component Value Date/Time   NA 133 (L) 07/01/2023 0450   NA 139 04/18/2022 1047   K 4.0 07/01/2023 0450   CL 105 07/01/2023 0450   CO2 20 (L) 07/01/2023 0450   GLUCOSE 80 07/01/2023 0450   BUN 16 07/01/2023 0450   BUN 18 04/18/2022 1047   CREATININE 0.62 07/01/2023 0450   CREATININE 0.66 06/23/2023 1558   CREATININE 1.07 01/04/2015 0815   CALCIUM 7.6 (L) 07/01/2023 0450   PROT 5.2 (L) 07/01/2023 0450   ALBUMIN 1.9 (L) 07/01/2023 0450   AST 44 (H) 07/01/2023 0450   AST 29 06/23/2023 1558   ALT 15 07/01/2023 0450   ALT 11 06/23/2023 1558   ALKPHOS 465 (H) 07/01/2023 0450   BILITOT 2.7 (H) 07/01/2023 0450   BILITOT 0.7 06/23/2023 1558   EGFR 85 04/18/2022 1047   GFRNONAA >60 07/01/2023 0450   GFRNONAA >60 06/23/2023 1558   Assessment:   Metastatic colon cancer (to  bone). Hematochezia.  Resolved.  Likely from colonic ulcer with visible vessel, treated endoscopically.  Etiology of ulcer could be ischemic or NSAID-mediated.  Endoscopic appearance not typical of malignancy. Acute blood loss anemia.  Plan:   Full liquid diet, advance as tolerated. I would be hesitant to use NSAIDs for pain control given my concern they could have caused his colonic ulcers.  If NSAIDs absolutely required I would favor COX2-inh (e.g., celecoxib) although there still remains risk (albeit lower) of GI tract ulceration with these agents as well. CBC, transfuse as needed. [facility] will follow.   [name] 07/01/2023, 10:43 AM   Cell [phone] If no answer or after 5 PM call [phone]

## 2023-07-01 NOTE — Consult Note (Signed)
 [facility] Liaison Consult Note   07/01/2023  [name] [birth year] [id number]  Insurance: [employer] [place] [employer]  Primary Care Provider: Joycelyn Rua, MD, is with Deboraha Sprang at [facility], this provider is listed for the transition of care follow up appointments  and [facility] Team for post hospital follow up calls   [facility] Liaison screened the patient remotely at [facility].    The patient was screened for 30 day readmission hospitalization with noted high risk score for unplanned readmission risk 3 hospital admissions in 6 months.  The patient was assessed for potential [facility] Coordination service needs for post hospital transition for care coordination. Review of patient's electronic medical record reveals patient is followed closely by the [facility] Oncology team.   Plan: [facility] Liaison will continue to follow progress and disposition to assess for post hospital community care coordination/management needs.  Referral request for community care coordination: Anticipate outreach by [facility] Transition Team.   [facility], [facility] does not replace or interfere with any arrangements made by the Inpatient Transition of Care team.   For questions contact:   Charlesetta Shanks, RN, BSN, CCM [place]  [facility], [facility] Liaison Direct Dial: [phone] or secure chat Email: [email or web address]

## 2023-07-01 NOTE — Progress Notes (Signed)
 PROGRESS NOTE    SHON INDELICATO  ZOX:[id number] DOB: 1965/05/30 DOA: 06/28/2023 PCP: Joycelyn Rua, MD    Brief Narrative:   58 year old with history of colon cancer on FOLFOX, Avastin follows Dr [name], insulin-dependent DM2, HTN, HLD, DVT on Pradaxa admitted to the hospital for bright red blood per rectum/acute blood loss anemia.  Recently treated for gluteal fold cellulitis.  Last dose of Pradaxa was the night before his hospitalization.  Admission hemoglobin was noted to be 6.5 therefore PRBC transfusion was ordered.  CT angio was negative for active bleeding but showed colonic wall thickening distal to prior anastomosis.  Colonoscopy prep was overall inadequate stool was noted in sigmoid colon.  There was oozing distal descending colon blood vessel which was endoscopically treated.  EGD showed grade B esophagitis and congested duodenal mucosa but no other signs of bleeding.  For now GI recommended holding off on anticoagulation for at least 1 week.  Assessment & Plan:  Principal Problem:   Lower GI bleed   Bright red blood per rectum, colorectal Acute blood loss anemia Acute on chronic anemia and thrombocytopenia -Baseline hemoglobin 8.0, due to rapid decline in his hemoglobin and multiple bright red blood per rectum, patient received total 3 units of PRBC.  Currently on PPI twice daily -Colonoscopy prep was overall inadequate stool was noted in sigmoid colon.  There was oozing distal descending colon blood vessel which was endoscopically treated.  EGD showed grade B esophagitis and congested duodenal mucosa but no other signs of bleeding.  For now GI recommended holding off on anticoagulation for at least 1 week.  Colon cancer, metastatic Anemia and thrombocytopenia, bone marrow suppression from chemo - On outpatient FOLFOX and Avastin.  Follows with [name].  Insulin-dependent diabetes mellitus type 2 - On Semglee, Accu-Cheks and sliding scale.  Will further adjust as  necessary -Holding multiple p.o. medications -Hold aspirin  Hypocalcemia - Repletion  Hyperlipidemia - Lipitor  Essential hypertension - Toprol-XL.  IV as needed -Currently holding lisinopril  History of DVT - Currently Pradaxa on hold, to be held at least for 1 week  Extensively discussed goals of care and encourage DNR/DNI after discussing his case with [name].  Patient would like to be DNR/DNI but would like to further discuss with his wife later today  DVT prophylaxis: SCDs Start: 06/28/23 1229    Code Status: Full Code Family Communication:   Status is: Inpatient Remains inpatient appropriate because: Ongoing monitoring of hemoglobin, GI bleed and slowly advancing diet.  Will remain in the hospital at least next 24 hours    Subjective:  Seen at bedside, overall feeling weak.  Agreeable for 1 more unit of blood.  No further bleeding overnight  Examination:  General exam: Appears calm and comfortable, pale Respiratory system: Clear to auscultation. Respiratory effort normal. Cardiovascular system: S1 & S2 heard, RRR. No JVD, murmurs, rubs, gallops or clicks. No pedal edema. Gastrointestinal system: Abdomen is nondistended, soft and nontender. No organomegaly or masses felt. Normal bowel sounds heard. Central nervous system: Alert and oriented. No focal neurological deficits. Extremities: Symmetric 5 x 5 power. Skin: No rashes, lesions or ulcers Psychiatry: Judgement and insight appear normal. Mood & affect appropriate.                Diet Orders (From admission, onward)     Start     Ordered   07/01/23 0941  DIET DYS 3 Room service appropriate? Yes; Fluid consistency: Thin  Diet effective now  Question Answer Comment  Room service appropriate? Yes   Fluid consistency: Thin      07/01/23 0941            Objective: Vitals:   07/01/23 0247 07/01/23 0538 07/01/23 0810 07/01/23 0900  BP: (!) 156/84 (!) 148/79  (!) 149/87  Pulse:  (!)  115  (!) 105  Resp: 18 18  20   Temp: 99 F (37.2 C) 98.1 F (36.7 C)  99.7 F (37.6 C)  TempSrc:  Oral  Oral  SpO2: 96%  92% 92%  Weight:      Height:        Intake/Output Summary (Last 24 hours) at 07/01/2023 1104 Last data filed at 07/01/2023 0942 Gross per 24 hour  Intake 1260 ml  Output 700 ml  Net 560 ml   Filed Weights   06/28/23 0929  Weight: 81.6 kg    Scheduled Meds:  sodium chloride   Intravenous Once   sodium chloride   Intravenous Once   atorvastatin  10 mg Oral Daily   budesonide  0.25 mg Nebulization BID   Chlorhexidine Gluconate Cloth  6 each Topical Daily   insulin aspart  0-15 Units Subcutaneous TID WC & HS   insulin glargine-yfgn  20 Units Subcutaneous Daily   metoprolol succinate  25 mg Oral Daily   morphine  60 mg Oral Q12H   pantoprazole (PROTONIX) IV  40 mg Intravenous Q12H   Continuous Infusions:  sodium chloride 75 mL/hr at 06/30/23 1256    Nutritional status     Body mass index is 25.1 kg/m.  Data Reviewed:   CBC: Recent Labs  Lab 06/28/23 1000 06/28/23 1008 06/29/23 1544 06/29/23 2328 06/30/23 0455 06/30/23 1640 07/01/23 0450  WBC 4.7  --   --   --  4.6  --  5.1  NEUTROABS 2.6  --   --   --   --   --   --   HGB 6.5*   < > 6.7* 7.5* 7.2* 7.4* 7.4*  HCT 21.0*   < > 21.7* 23.5* 22.4* 23.8* 23.8*  MCV 101.0*  --   --   --  98.7  --  100.4*  PLT 74*  --   --   --  54*  --  49*   < > = values in this interval not displayed.   Basic Metabolic Panel: Recent Labs  Lab 06/28/23 1000 06/28/23 1008 06/30/23 0455 07/01/23 0450  NA 129* 130* 132* 133*  K 5.0 4.8 3.9 4.0  CL 99 99 105 105  CO2 20*  --  18* 20*  GLUCOSE 251* 242* 152* 80  BUN 24* 21* 17 16  CREATININE 0.62 0.60* 0.60* 0.62  CALCIUM 7.6*  --  7.3* 7.6*  MG  --   --  1.7 1.8  PHOS  --   --  3.0  --    GFR: Estimated Creatinine Clearance: 109.8 mL/min (by C-G formula based on SCr of 0.62 mg/dL). Liver Function Tests: Recent Labs  Lab 06/28/23 1000  06/30/23 0455 07/01/23 0450  AST 41 36 44*  ALT 18 15 15   ALKPHOS 676* 507* 465*  BILITOT 0.9 2.1* 2.7*  PROT 5.5* 4.9* 5.2*  ALBUMIN 2.0* 1.9* 1.9*   No results for input(s): "LIPASE", "AMYLASE" in the last 168 hours. No results for input(s): "AMMONIA" in the last 168 hours. Coagulation Profile: No results for input(s): "INR", "PROTIME" in the last 168 hours. Cardiac Enzymes: No results for input(s): "CKTOTAL", "CKMB", "CKMBINDEX", "  TROPONINI" in the last 168 hours. BNP (last 3 results) No results for input(s): "PROBNP" in the last 8760 hours. HbA1C: No results for input(s): "HGBA1C" in the last 72 hours. CBG: Recent Labs  Lab 06/30/23 0726 06/30/23 1150 06/30/23 1648 06/30/23 2122 07/01/23 0815  GLUCAP 149* 156* 103* 121* 140*   Lipid Profile: No results for input(s): "CHOL", "HDL", "LDLCALC", "TRIG", "CHOLHDL", "LDLDIRECT" in the last 72 hours. Thyroid Function Tests: No results for input(s): "TSH", "T4TOTAL", "FREET4", "T3FREE", "THYROIDAB" in the last 72 hours. Anemia Panel: No results for input(s): "VITAMINB12", "FOLATE", "FERRITIN", "TIBC", "IRON", "RETICCTPCT" in the last 72 hours. Sepsis Labs: No results for input(s): "PROCALCITON", "LATICACIDVEN" in the last 168 hours.  No results found for this or any previous visit (from the past 240 hours).       Radiology Studies: No results found.         LOS: 3 days   Time spent= 35 mins    Miguel Rota, MD [facility]  If 7PM-7AM, please contact night-coverage  07/01/2023, 11:04 AM

## 2023-07-01 NOTE — Plan of Care (Signed)

## 2023-07-01 NOTE — Progress Notes (Addendum)
 [name]   DOB:1966/02/17   VH#:[id number]      ASSESSMENT & PLAN:  Adenocarcinoma of colon, metastatic - Moderate to poorly differentiated - Diagnosed 01/07/2019 - Status post left colectomy 03/04/2019.  Status post FOLFOX chemotherapy from 2022 2021 x 12 cycles, completed 08/22/2019. - Subsequently started on chemotherapy regimen FOLFIRI/bevacizumab on 05/26/2022.  Completed cycle 24 on 05/06/2023. - Started on cycle 1 FOLFOX/Avastin on 06/11/2023 - Medical oncology/Dr. Truett [name] following.  Last seen in outpatient oncology office on 06/10/2023 at which time chemotherapy was held due to severe anemia. - CODE STATUS discussed with patient who states that his wife wants him to continue being a full code although patient himself does not want that.  States he is going along with the decision of his wife to remain full code.      Acute rectal bleeding Acute blood loss anemia Acute on chronic anemia - Low hemoglobin likely related to lower GI bleed.  Patient reports no further rectal bleeding since colonoscopy done 06/30/2023. - Hemoglobin low 7.4 today.  Patient is currently receiving a unit of PRBC, tolerating well.   - Endoscopy and colonoscopy done 06/30/2023.  Colonoscopy prep was inadequate.  Mucosal ulceration in the distal descending colon, visible oozing blood vessel endoscopically treated.  EGD showed esophagitis with no other signs of bleeding.  GI recommended holding off on anticoagulation for at least 1 week. - Transfuse PRBC for Hgb <7.0 - Continue to monitor CBC with differential closely   Severe pain Bilateral lower extremity weakness - Pain is well-controlled this time  - Secondary to extensive bone mets - Continue judicious pain management - PT evaluation   Diabetes Hypertension - Continue monitoring blood sugar levels and insulin per sliding scale - Continue antihypertensives as ordered - Monitor closely        Code Status Full  Subjective:  Patient seen awake  alert and oriented x 3.  Currently receiving PRBC transfusion.  He is status post endoscopy and colonoscopy which were done yesterday.  Patient reports he has had no further rectal bleeding since procedures.  No other acute complaints offered.  Objective:  Vitals:   07/01/23 0810 07/01/23 0900  BP:  (!) 149/87  Pulse:  (!) 105  Resp:  20  Temp:  99.7 F (37.6 C)  SpO2: 92% 92%     Intake/Output Summary (Last 24 hours) at 07/01/2023 1054 Last data filed at 07/01/2023 8413 Gross per 24 hour  Intake 1260 ml  Output 700 ml  Net 560 ml     REVIEW OF SYSTEMS:   Constitutional: Denies fevers, chills or abnormal night sweats Eyes: Denies blurriness of vision, double vision or watery eyes Ears, nose, mouth, throat, and face: Denies mucositis or sore throat Respiratory: Denies cough, dyspnea or wheezes Cardiovascular: Denies palpitation, chest discomfort or lower extremity swelling Gastrointestinal: + Rectal bleeding improving Skin: Denies abnormal skin rashes Lymphatics: Denies new lymphadenopathy or easy bruising Neurological: Denies numbness, tingling or new weaknesses Behavioral/Psych: Mood is stable, no new changes  All other systems were reviewed with the patient and are negative.  PHYSICAL EXAMINATION: ECOG PERFORMANCE STATUS: 2 - Symptomatic, <50% confined to bed  Vitals:   07/01/23 0810 07/01/23 0900  BP:  (!) 149/87  Pulse:  (!) 105  Resp:  20  Temp:  99.7 F (37.6 C)  SpO2: 92% 92%   Filed Weights   06/28/23 0929  Weight: 180 lb (81.6 kg)    GENERAL: alert, no distress and comfortable SKIN: skin color, texture,  turgor are normal, no rashes or significant lesions EYES: normal, conjunctiva are pink and non-injected, sclera clear OROPHARYNX: no exudate, no erythema and lips, buccal mucosa, and tongue normal  NECK: supple, thyroid normal size, non-tender, without nodularity LYMPH: no palpable lymphadenopathy in the cervical, axillary or inguinal LUNGS: Bilateral  inspiratory/expiratory wheeze, no respiratory distress HEART: regular rate & rhythm and no murmurs and no lower extremity edema ABDOMEN: abdomen soft, non-tender and normal bowel sounds MUSCULOSKELETAL: no cyanosis of digits and no clubbing, tender at the left low anterior ribs, pain with motion at the legs PSYCH: alert & oriented x 3 with fluent speech NEURO: no focal motor/sensory deficits   All questions were answered. The patient knows to call the clinic with any problems, questions or concerns.   The total time spent in the appointment was 40 minutes encounter with patient including review of chart and various tests results, discussions about plan of care and coordination of care plan  David Bills, NP 07/01/2023 10:54 AM    Labs Reviewed:  Lab Results  Component Value Date   WBC 5.1 07/01/2023   HGB 7.4 (L) 07/01/2023   HCT 23.8 (L) 07/01/2023   MCV 100.4 (H) 07/01/2023   PLT 49 (L) 07/01/2023   Recent Labs    06/28/23 1000 06/28/23 1008 06/30/23 0455 07/01/23 0450  NA 129* 130* 132* 133*  K 5.0 4.8 3.9 4.0  CL 99 99 105 105  CO2 20*  --  18* 20*  GLUCOSE 251* 242* 152* 80  BUN 24* 21* 17 16  CREATININE 0.62 0.60* 0.60* 0.62  CALCIUM 7.6*  --  7.3* 7.6*  GFRNONAA >60  --  >60 >60  PROT 5.5*  --  4.9* 5.2*  ALBUMIN 2.0*  --  1.9* 1.9*  AST 41  --  36 44*  ALT 18  --  15 15  ALKPHOS 676*  --  507* 465*  BILITOT 0.9  --  2.1* 2.7*    Studies Reviewed:  CT ANGIO GI BLEED Result Date: 06/28/2023 CLINICAL DATA:  Lower GI bleeding. Mesenteric ischemia, acute. Metastatic colon cancer EXAM: CTA ABDOMEN AND PELVIS WITHOUT AND WITH CONTRAST TECHNIQUE: Multidetector CT imaging of the abdomen and pelvis was performed using the standard protocol during bolus administration of intravenous contrast. Multiplanar reconstructed images and MIPs were obtained and reviewed to evaluate the vascular anatomy. RADIATION DOSE REDUCTION: This exam was performed according to the  departmental dose-optimization program which includes automated exposure control, adjustment of the mA and/or kV according to patient size and/or use of iterative reconstruction technique. CONTRAST:  80mL OMNIPAQUE IOHEXOL 350 MG/ML SOLN COMPARISON:  06/04/2023 FINDINGS: VASCULAR Aorta: Normal caliber aorta without aneurysm, dissection, vasculitis or significant stenosis. Mild atherosclerosis. Celiac: Patent without evidence of aneurysm, dissection, vasculitis or significant stenosis. SMA: Patent without evidence of aneurysm, dissection, vasculitis or significant stenosis. Renals: The right renal artery is patent without evidence of aneurysm, dissection, vasculitis, fibromuscular dysplasia or significant stenosis. Patient has had a left nephrectomy. IMA: The origin and proximal aspect of the inferior mesenteric artery is not identified. This does appear to be reconstituted by branches of the superior mesenteric artery. Inflow: Patent without evidence of aneurysm, dissection, vasculitis or significant stenosis. Proximal Outflow: Bilateral common femoral and visualized portions of the superficial and profunda femoral arteries are patent without evidence of aneurysm, dissection, vasculitis or significant stenosis. Veins: Major venous structures are patent. Review of the MIP images confirms the above findings. NON-VASCULAR Lower chest: Small layering left pleural effusion. Hepatobiliary:  1.1 cm hypoattenuating lesion in the periphery of the right lower lobe, not definitively seen on the prior study (series 18, image 29. Previously described subtly hypoattenuating lesions within the liver at the gallbladder fossa and IVC are difficult to characterize but do not appear appreciably changed. Atrophy of the left hepatic lobe. Right hepatic lobe measures 22 cm in length. Small stone within the gallbladder. No biliary dilatation. Pancreas: Grossly unremarkable. Spleen: Grossly unchanged appearance of the spleen with multiple  heterogeneously areas of hypoattenuation including dominant lesion measuring approximately 11.2 x 8.5 cm. 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 Adrenals/Urinary Tract: Thickened appearance of the adrenal glands. Prior left nephrectomy. Unremarkable right kidney. No stone or hydronephrosis. Unremarkable urinary bladder. Stomach/Bowel: Tiny hiatal hernia. Stomach otherwise within normal limits. No abnormally dilated loops of bowel. Postsurgical changes from prior sigmoid resection and reanastomosis abnormal colonic wall thickening in the region of the distal sigmoid colon (series 18, image 66). There is also focal colonic wall thickening just distal to the anastomotic site (series 18, image 57). Normal appendix in the right lower quadrant (series 18, image 53). No evidence of abnormal contrast accumulation within the bowel on multiphasic imaging to localize site of active gastrointestinal bleeding. Lymphatic: Mildly prominent left external iliac chain lymph node measuring 9 mm (series 18, image 59), unchanged. No new or enlarging abdominopelvic lymph nodes. Reproductive: Prostatomegaly. Other: No ascites or pneumoperitoneum. Musculoskeletal: Extensive, widespread osseous metastatic disease, without evidence of significant progression compared to the prior study. Scoliotic spinal curvature. No pathologic fractures are seen. IMPRESSION: 1. The origin and proximal aspect of the inferior mesenteric artery is not identified. This does appear to be reconstituted by branches of the superior mesenteric artery. Otherwise, no significant vascular abnormality. 2. No evidence of abnormal contrast accumulation within the bowel to localize site of active gastrointestinal bleeding. 3. Postsurgical changes from prior sigmoid resection and reanastomosis. Abnormal colonic wall thickening in the region of the distal sigmoid colon and just distal to the anastomotic site. Findings may represent sites of  focal colitis, however recurrent malignancy is not excluded. Follow-up colonoscopy should be considered following the resolution of patient's acute symptoms. 4. New 1.1 cm hypoattenuating lesion in the periphery of the right hepatic lobe, not definitively seen on the prior study. This is concerning for a new site of metastatic disease. 5. Extensive, widespread osseous metastatic disease, without evidence of significant progression compared to the prior study. 6. Small layering left pleural effusion. 7. Cholelithiasis. 8. Aortic atherosclerosis. Electronically Signed   By: Duanne Guess D.O.   On: 06/28/2023 11:18   CT ABDOMEN PELVIS W CONTRAST Result Date: 06/04/2023 CLINICAL DATA:  Acute generalized abdominal pain. History of metastatic colon cancer. EXAM: CT ABDOMEN AND PELVIS WITH CONTRAST TECHNIQUE: Multidetector CT imaging of the abdomen and pelvis was performed using the standard protocol following bolus administration of intravenous contrast. RADIATION DOSE REDUCTION: This exam was performed according to the departmental dose-optimization program which includes automated exposure control, adjustment of the mA and/or kV according to patient size and/or use of iterative reconstruction technique. CONTRAST:  OMNIPAQUE IOHEXOL 300 MG/ML  SOLN COMPARISON:  April 04, 2023.  May 09, 2022. FINDINGS: Lower chest: No acute abnormality. Hepatobiliary: Small gallstone. No biliary dilatation. Liver is unremarkable. Pancreas: Unremarkable. No pancreatic ductal dilatation or surrounding inflammatory changes. Spleen: 11 x 9.1 cm complex lesion seen in the spleen as described on prior exam which may represent hamartoma as noted on prior studies. Adrenals/Urinary Tract: Adrenal glands are  unremarkable. Status post left nephrectomy. No hydronephrosis or renal obstruction. Urinary bladder is unremarkable. Stomach/Bowel: Stomach is unremarkable. There is no evidence of bowel obstruction or inflammation. Appendix  appears normal. Vascular/Lymphatic: Aortic atherosclerosis. No enlarged abdominal or pelvic lymph nodes. Reproductive: Prostate is unremarkable. Other: No ascites or hernia is noted. Musculoskeletal: Grossly stable sclerotic densities noted throughout skeleton consistent with osseous metastases. IMPRESSION: Small gallstone. 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 Grossly stable diffuse osseous sclerotic densities consistent with osseous metastases. Aortic Atherosclerosis (ICD10-I70.0). Electronically Signed   By: Lupita Raider M.D.   On: 06/04/2023 19:14   Mr. Meissner was interviewed and examined.  He reports no further rectal bleeding after the colonoscopy yesterday.  He was found to have ulceration in the colon with a bleeding vessel, treated with epinephrine and a clip.  It is unclear whether the colon ulceration is related to malignancy. Mr. Cohick continues to have severe pain at multiple bone sites.  This limits his mobility.  He is taking MS Contin and Dilaudid.  Dr. Dulce Sellar recommends not beginning a nonsteroidal anti-inflammatory agent due to ulcer risk.  MS Contin was increased a few days ago.  We increase the MS Contin if his pain is not improved tomorrow.  I discussed disposition plans for Mr. Staggs.  We made a PT/OT referral today.  Hopefully he can get home within the next few days if the eating remains resolved.  He indicated he would like to continue treatment of the cancer.  He will be scheduled for cycle 2 FOLFOX next week.  Bevacizumab will be held.  Mr. Noe relates wheezing to "allergies ".  The bilirubin has been elevated for the past 2 days.  The etiology is unclear.  He could have intramedullary hemolysis.   He agrees to a no CODE BLUE status, but he has not yet discussed this with his wife.  He would like to have discussion with his wife prior to changing his CODE STATUS.  Recommendations: Continue MS Contin and Dilaudid for pain Check fractionated bilirubin  07/02/2023 Transfuse packed red blood cells for symptomatic anemia CBC 07/02/2023 Continue discussions regarding CODE STATUS with Mr. Pauwels and his wife Outpatient follow-up at the cancer center for an office visit and FOLFOX during the week of 07/06/2023

## 2023-07-01 NOTE — Evaluation (Signed)
 Physical Therapy Evaluation Patient Details Name: [name] MRN: [id number] DOB: May 29, 1965 Today's Date: 07/01/2023  History of Present Illness  58 yo male admitted with LGIB, anemia, weakness. Hx of met colon Ca, DM, DVT, discitis, endocarditis, chronc hyponatremia  Clinical Impression  On eval, pt required Mod A for bed mobility. He was able to sit EOB for ~4-5 minutes with CGA. Pt c/o dizziness and requested to return to bed. Pt presents with general weakness, decreased activity tolerance, and impaired gait/balance. Mod-severe pain with activity. Pt was shaky with exertion. Wife arrived at end of session. Wife also mentioned that pt has had an inpatient rehab stay before. Unsure at this time if he could tolerate intensive rehab. Recommend HHPT+ Home Health Aide vs SNF, depending on progress-pt needs to be able to stand and transfer/ambulate a few steps in order to return home per wife. Recommend daily OOB to recliner with nursing and/or mobility team in addition to PT/OT sessions to increase mobility.        If plan is discharge home, recommend the following: A lot of help with walking and/or transfers;A lot of help with bathing/dressing/bathroom;Assistance with cooking/housework;Assist for transportation;Help with stairs or ramp for entrance   Can travel by private vehicle        Equipment Recommendations None recommended by PT  Recommendations for Other Services  OT consult    Functional Status Assessment Patient has had a recent decline in their functional status and demonstrates the ability to make significant improvements in function in a reasonable and predictable amount of time.     Precautions / Restrictions Precautions Precautions: Fall Precaution/Restrictions Comments: pre-medicate? Restrictions Weight Bearing Restrictions Per Provider Order: No      Mobility  Bed Mobility Overal bed mobility: Needs Assistance Bed Mobility: Supine to Sit, Sit to Sidelying      Supine to sit: Mod assist, HOB elevated, Used rails   Sit to sidelying: Mod assist, Used rails, HOB elevated General bed mobility comments: Assist for trunk and bil LEs. Utilized bedp to aid with scooting, positioning at EOB. Increased time. Pt sat EOB for ~ 4-5 minutes with CGA. C/o some dizziness and requested to return to bed.    Transfers                   General transfer comment: NT on today    Ambulation/Gait                  Stairs            Wheelchair Mobility     Tilt Bed    Modified Rankin (Stroke Patients Only)       Balance Overall balance assessment: Needs assistance         Standing balance support: Bilateral upper extremity supported, During functional activity, Reliant on assistive device for balance Standing balance-Leahy Scale: Poor                               Pertinent Vitals/Pain Pain Assessment Pain Assessment: 0-10 Pain Score: 8  Pain Location: R shoulder, back, hips, LEs Pain Descriptors / Indicators: Grimacing, Guarding, Sharp, Aching Pain Intervention(s): Limited activity within patient's tolerance, Monitored during session, Repositioned    Home Living Family/patient expects to be discharged to:: Private residence Living Arrangements: Spouse/significant other Available Help at Discharge: Family;Friend(s);Available PRN/intermittently Type of Home: House Home Access: Stairs to enter Entrance Stairs-Rails: Right Entrance Stairs-Number of Steps: 2 Alternate  Level Stairs-Number of Steps: 1 flight Home Layout: Two level;Able to live on main level with bedroom/bathroom Home Equipment: Rolling Walker (2 wheels);BSC/3in1;Shower seat Additional Comments: wife works 4 minutes from home    Prior Function Prior Level of Function : Needs assist             Mobility Comments: no falls. RW at all times. Assist for bed mobility. ADLs Comments: requires assistance     Extremity/Trunk Assessment   Upper  Extremity Assessment Upper Extremity Assessment: Defer to OT evaluation    Lower Extremity Assessment Lower Extremity Assessment: Generalized weakness (shaky. strength at least 3/5 except for hips ~2/5-limited by pain)    Cervical / Trunk Assessment Cervical / Trunk Assessment: Normal  Communication   Communication Communication: No apparent difficulties    Cognition Arousal: Alert Behavior During Therapy: WFL for tasks assessed/performed   PT - Cognitive impairments: No apparent impairments                         Following commands: Intact       Cueing Cueing Techniques: Verbal cues     General Comments      Exercises Total Joint Exercises Ankle Circles/Pumps: AROM, Both, 5 reps Quad Sets: AROM, Both, 5 reps   Assessment/Plan    PT Assessment Patient needs continued PT services  PT Problem List Decreased strength;Decreased range of motion;Decreased activity tolerance;Decreased balance;Decreased mobility;Decreased knowledge of use of DME;Pain       PT Treatment Interventions DME instruction;Gait training;Functional mobility training;Therapeutic activities;Therapeutic exercise;Patient/family education;Balance training    PT Goals (Current goals can be found in the Care Plan section)  Acute Rehab PT Goals Patient Stated Goal: less pain. get stronger and go home PT Goal Formulation: With patient/family Time For Goal Achievement: 07/15/23 Potential to Achieve Goals: Fair    Frequency Min 2X/week     Co-evaluation               AM-PAC PT "6 Clicks" Mobility  Outcome Measure Help needed turning from your back to your side while in a flat bed without using bedrails?: A Lot Help needed moving from lying on your back to sitting on the side of a flat bed without using bedrails?: A Lot Help needed moving to and from a bed to a chair (including a wheelchair)?: A Lot Help needed standing up from a chair using your arms (e.g., wheelchair or bedside  chair)?: A Lot Help needed to walk in hospital room?: A Lot Help needed climbing 3-5 steps with a railing? : Total 6 Click Score: 11    End of Session   Activity Tolerance: Patient tolerated treatment well;Patient limited by pain Patient left: in bed;with call bell/phone within reach;with bed alarm set;with family/visitor present   PT Visit Diagnosis: Pain;Muscle weakness (generalized) (M62.81);Difficulty in walking, not elsewhere classified (R26.2)    Time: 1610-9604 PT Time Calculation (min) (ACUTE ONLY): 34 min   Charges:   PT Evaluation $PT Eval Low Complexity: 1 Low PT Treatments $Therapeutic Activity: 8-22 mins PT General Charges $$ ACUTE PT VISIT: 1 Visit            Faye Ramsay, PT Acute Rehabilitation  Office: [phone]

## 2023-07-02 DIAGNOSIS — K922 Gastrointestinal hemorrhage, unspecified: Secondary | ICD-10-CM | POA: Diagnosis not present

## 2023-07-02 LAB — TYPE AND SCREEN
ABO/RH(D): A POS
Antibody Screen: NEGATIVE
Unit division: 0
Unit division: 0
Unit division: 0
Unit division: 0

## 2023-07-02 LAB — BPAM RBC
Blood Product Expiration Date: 202504222359
Blood Product Expiration Date: 202504072359
Blood Product Expiration Date: 202504272359
Blood Product Expiration Date: 202504272359
ISSUE DATE / TIME: 202504021108
ISSUE DATE / TIME: 202503301211
ISSUE DATE / TIME: 202503301547
ISSUE DATE / TIME: 202503311535
Unit Type and Rh: 6200
Unit Type and Rh: 6200
Unit Type and Rh: 6200
Unit Type and Rh: 6200

## 2023-07-02 LAB — COMPREHENSIVE METABOLIC PANEL WITH GFR
ALT: 21 U/L (ref 0–44)
AST: 63 U/L — ABNORMAL HIGH (ref 15–41)
Albumin: 1.6 g/dL — ABNORMAL LOW (ref 3.5–5.0)
Alkaline Phosphatase: 465 U/L — ABNORMAL HIGH (ref 38–126)
Anion gap: 7 (ref 5–15)
BUN: 14 mg/dL (ref 6–20)
CO2: 21 mmol/L — ABNORMAL LOW (ref 22–32)
Calcium: 7.4 mg/dL — ABNORMAL LOW (ref 8.9–10.3)
Chloride: 98 mmol/L (ref 98–111)
Creatinine, Ser: 0.53 mg/dL — ABNORMAL LOW (ref 0.61–1.24)
GFR, Estimated: >60 mL/min (ref >60)
Glucose, Bld: 118 mg/dL — ABNORMAL HIGH (ref 70–99)
Potassium: 4.1 mmol/L (ref 3.5–5.1)
Sodium: 126 mmol/L — ABNORMAL LOW (ref 135–145)
Total Bilirubin: 4.9 mg/dL — ABNORMAL HIGH (ref 0.0–1.2)
Total Protein: 4.9 g/dL — ABNORMAL LOW (ref 6.5–8.1)

## 2023-07-02 LAB — CBC
HCT: 27.8 % — ABNORMAL LOW (ref 39.0–52.0)
Hemoglobin: 8.6 g/dL — ABNORMAL LOW (ref 13.0–17.0)
MCH: 30.8 pg (ref 26.0–34.0)
MCHC: 30.9 g/dL (ref 30.0–36.0)
MCV: 99.6 fL (ref 80.0–100.0)
Platelets: 39 10*3/uL — ABNORMAL LOW (ref 150–400)
RBC: 2.79 MIL/uL — ABNORMAL LOW (ref 4.22–5.81)
RDW: 19.3 % — ABNORMAL HIGH (ref 11.5–15.5)
WBC: 5.8 10*3/uL (ref 4.0–10.5)
nRBC: 8.1 % — ABNORMAL HIGH (ref 0.0–0.2)

## 2023-07-02 LAB — HEMOGLOBIN AND HEMATOCRIT, BLOOD
HCT: 27.3 % — ABNORMAL LOW (ref 39.0–52.0)
Hemoglobin: 8.5 g/dL — ABNORMAL LOW (ref 13.0–17.0)

## 2023-07-02 LAB — GLUCOSE, CAPILLARY
Glucose-Capillary: 129 mg/dL — ABNORMAL HIGH (ref 70–99)
Glucose-Capillary: 175 mg/dL — ABNORMAL HIGH (ref 70–99)
Glucose-Capillary: 159 mg/dL — ABNORMAL HIGH (ref 70–99)
Glucose-Capillary: 186 mg/dL — ABNORMAL HIGH (ref 70–99)

## 2023-07-02 LAB — PROTIME-INR
INR: 1.3 — ABNORMAL HIGH (ref 0.8–1.2)
Prothrombin Time: 16.2 s — ABNORMAL HIGH (ref 11.4–15.2)

## 2023-07-02 LAB — RETICULOCYTES
Immature Retic Fract: 35.7 % — ABNORMAL HIGH (ref 2.3–15.9)
RBC.: 2.76 MIL/uL — ABNORMAL LOW (ref 4.22–5.81)
Retic Count, Absolute: 130.5 10*3/uL (ref 19.0–186.0)
Retic Ct Pct: 4.7 % — ABNORMAL HIGH (ref 0.4–3.1)

## 2023-07-02 LAB — FIBRINOGEN: Fibrinogen: 579 mg/dL — ABNORMAL HIGH (ref 210–475)

## 2023-07-02 LAB — APTT: aPTT: 34 s (ref 24–36)

## 2023-07-02 LAB — BILIRUBIN, DIRECT: Bilirubin, Direct: 2.9 mg/dL — ABNORMAL HIGH (ref 0.0–0.2)

## 2023-07-02 LAB — MAGNESIUM: Magnesium: 1.8 mg/dL (ref 1.7–2.4)

## 2023-07-02 MED ORDER — LORATADINE 10 MG PO TABS
10.0000 mg | ORAL_TABLET | Freq: Every day | ORAL | Status: DC
Start: 2023-07-02 — End: 2023-07-11
  Administered 2023-07-03 – 2023-07-11 (×9): 10 mg via ORAL
  Filled 2023-07-02 (×9): qty 1

## 2023-07-02 NOTE — Progress Notes (Signed)
 PROGRESS NOTE [name]  MWN:[id number] DOB: December 31, 1965 DOA: 06/28/2023 PCP: Joycelyn Rua, MD  Brief Narrative/Hospital Course: 58 year old with history of colon cancer on FOLFOX, Avastin follows Dr [name], insulin-dependent DM2, HTN, HLD, DVT on Pradaxa admitted to the hospital for bright red blood per rectum/acute blood loss anemia.  Recently treated for gluteal fold cellulitis.  Last dose of Pradaxa was the night before his hospitalization.  Admission hemoglobin was noted to be 6.5 therefore PRBC transfusion was ordered.  CT angio was negative for active bleeding but showed colonic wall thickening distal to prior anastomosis.  Colonoscopy prep was overall inadequate stool was noted in sigmoid colon.  There was oozing distal descending colon blood vessel which was endoscopically treated.  EGD showed grade B esophagitis and congested duodenal mucosa but no other signs of bleeding.  For now GI recommended holding off on anticoagulation for at least 1 week. Overall remains hemodynamically stable  Consultation: Gastroenterology Oncology  Procedures/testing: E4/1 : EGD/ COLONOSCOPY (Left)-CONTROL OF HEMORRHAGE, GI TRACT, ENDOSCOPIC, BY CLIPPING OR OVERSEWING SCLEROTHERAPY   Subjective: Patient seen and examined this morning He is resting comfortably No complaints Trying to have bowel movements complains of cramps requesting stool softener Overnight afebrile heart rate in 100-110, BP stable.  Labs with hyponatremia 133>126,  CBC with hemoglobin of 8.6 g plt donw 54>49>39   Assessment & Plan:  BRBPR-colorectal ABLA Acute on chronic anemia Acute on chronic thrombocytopenia: B/L hB ~8.0, due to rapid decline in his hemoglobin and multiple brbpr received total 3 units of PRBC. S/P EGD colonoscopy 4/1- prep was overall inadequate,oozing distal descending colon blood vessel which was endoscopically treated. EGD showed grade B esophagitis and congested duodenal mucosa but no other signs  of bleeding. GI recommended to hold off anticoagulation at least 1 week.  Aspirin also on hold. Continue PPI twice daily, trend and transfuse to keep hemoglobin above 7 g and trending up, Add gentle stool softener and monitor bowel movement Recent Labs  Lab 06/30/23 0455 06/30/23 1640 07/01/23 0450 07/01/23 1641 07/02/23 0330  HGB 7.2* 7.4* 7.4* 8.2* 8.6*  HCT 22.4* 23.8* 23.8* 26.7* 27.8*    Colon cancer, metastatic Anemia and thrombocytopenia, bone marrow suppression from chemo On  FOLFOX and Avastin as Op. Follows with [name].  Severe cancer-related pain Lower extremity weakness: Patient has pain due to extensive bone metastasis and has deconditioning and weakness.  Continue pain management p.o./IV narcotics, ms contin nonnarcotics PTOT  Insulin-dependent diabetes mellitus type 2 with uncontrolled hyperglycemia: At home on insulin glargine 41 u daily, metformin 1000 twice daily.  Blood sugar fairly controlled on Semglee 20 units, SSI holding p.o. meds.  Recent Labs  Lab 07/01/23 0815 07/01/23 1138 07/01/23 1645 07/01/23 2108 07/02/23 0734  GLUCAP 140* 175* 145* 118* 129*    Hypocalcemia 7.4 BUT W/ low albumin.Monitor.  Hyponatremia: Sodium slightly down continue to monitor as outpatient.  Not on diuretics  Hyperlipidemia Cont Lipitor  Essential hypertension Bo stable cont Toprol-XL.  Lisinopril on hold  History of DVT Pradaxa remains on hold due to GI bleeding x 1 week. Resume as OP.  Goals of care/CODE STATUS: Extensively discussed goals of care and encourage DNR/DNI after discussing his case with [name] by Dr Amin-Per oncology remains full code for now    DVT prophylaxis: SCDs Start: 06/28/23 1229 Code Status:   Code Status: Full Code Family Communication: plan of care discussed with patient at bedside. Patient status is: Remains hospitalized because of severity of illness Level of care: Med-Surg  Dispo: The patient is from: home             Anticipated disposition: SNF in 1 day if stable  Objective: Vitals last 24 hrs: Vitals:   07/01/23 1440 07/01/23 2001 07/02/23 0452 07/02/23 0915  BP: (!) 147/82 122/87 (!) 134/93   Pulse: (!) 105 (!) 103 (!) 110   Resp: 20 19 19    Temp: 98.6 F (37 C) 99.8 F (37.7 C) 98.5 F (36.9 C)   TempSrc: Oral Oral    SpO2: 94% 93% (!) 88% 92%  Weight:      Height:       Weight change:   Physical Examination: General exam: alert awake, older than stated age HEENT:Oral mucosa moist, Ear/Nose WNL grossly Respiratory system: Bilaterally diminished BS, no use of accessory muscle Cardiovascular system: S1 & S2 +. Gastrointestinal system: Abdomen soft, NT,ND,BS+ Nervous System: Alert, awake,following commands. Extremities: LE edema neg, moving arms, warm legs Skin: No rashes,warm. MSK: Normal muscle bulk/tone.   Medications reviewed:  Scheduled Meds:  sodium chloride   Intravenous Once   atorvastatin  10 mg Oral Daily   budesonide  0.25 mg Nebulization BID   Chlorhexidine Gluconate Cloth  6 each Topical Daily   insulin aspart  0-15 Units Subcutaneous TID WC & HS   insulin glargine-yfgn  20 Units Subcutaneous Daily   metoprolol succinate  25 mg Oral Daily   morphine  60 mg Oral Q12H   pantoprazole (PROTONIX) IV  40 mg Intravenous Q12H  Continuous Infusions:   Diet Order             DIET DYS 3 Room service appropriate? Yes; Fluid consistency: Thin  Diet effective now                  Intake/Output Summary (Last 24 hours) at 07/02/2023 1129 Last data filed at 07/02/2023 1043 Gross per 24 hour  Intake 315 ml  Output 200 ml  Net 115 ml   Net IO Since Admission: 6,227.81 mL [07/02/23 1129]  Wt Readings from Last 3 Encounters:  06/28/23 81.6 kg  06/17/23 81.6 kg  06/10/23 85 kg     Unresulted Labs (From admission, onward)     Start     Ordered   07/02/23 0951  Protime-INR  Once,   R       Question:  Specimen collection method  Answer:  IV Team=IV Team collect    07/02/23 0950   07/02/23 0951  APTT  Once,   R       Question:  Specimen collection method  Answer:  IV Team=IV Team collect   07/02/23 0950   07/02/23 0950  Fibrinogen  Once,   R       Question:  Specimen collection method  Answer:  IV Team=IV Team collect   07/02/23 0950   06/30/23 0500  CBC  Daily,   R     Question:  Specimen collection method  Answer:  IV Team=IV Team collect   06/29/23 0808   06/30/23 0500  Comprehensive metabolic panel with GFR  Daily,   R     Question:  Specimen collection method  Answer:  IV Team=IV Team collect   06/29/23 0808   06/30/23 0500  Magnesium  Daily,   R     Question:  Specimen collection method  Answer:  IV Team=IV Team collect   06/29/23 0808   06/29/23 0808  Hemoglobin and hematocrit, blood  Now then every 12 hours,   R (  with TIMED occurrences)     Question:  Specimen collection method  Answer:  IV Team=IV Team collect   06/29/23 0808          Data Reviewed: I have personally reviewed following labs and imaging studies ( see epic result tab) CBC: Recent Labs  Lab 06/28/23 1000 06/28/23 1008 06/30/23 0455 06/30/23 1640 07/01/23 0450 07/01/23 1641 07/02/23 0330  WBC 4.7  --  4.6  --  5.1  --  5.8  NEUTROABS 2.6  --   --   --   --   --   --   HGB 6.5*   < > 7.2* 7.4* 7.4* 8.2* 8.6*  HCT 21.0*   < > 22.4* 23.8* 23.8* 26.7* 27.8*  MCV 101.0*  --  98.7  --  100.4*  --  99.6  PLT 74*  --  54*  --  49*  --  39*   < > = values in this interval not displayed.   CMP: Recent Labs  Lab 06/28/23 1000 06/28/23 1008 06/30/23 0455 07/01/23 0450 07/02/23 0330  NA 129* 130* 132* 133* 126*  K 5.0 4.8 3.9 4.0 4.1  CL 99 99 105 105 98  CO2 20*  --  18* 20* 21*  GLUCOSE 251* 242* 152* 80 118*  BUN 24* 21* 17 16 14   CREATININE 0.62 0.60* 0.60* 0.62 0.53*  CALCIUM 7.6*  --  7.3* 7.6* 7.4*  MG  --   --  1.7 1.8 1.8  PHOS  --   --  3.0  --   --    GFR: Estimated Creatinine Clearance: 109.8 mL/min (A) (by C-G formula based on SCr of 0.53 mg/dL  (L)). Recent Labs  Lab 06/28/23 1000 06/30/23 0455 07/01/23 0450 07/02/23 0330  AST 41 36 44* 63*  ALT 18 15 15 21   ALKPHOS 676* 507* 465* 465*  BILITOT 0.9 2.1* 2.7* 4.9*  PROT 5.5* 4.9* 5.2* 4.9*  ALBUMIN 2.0* 1.9* 1.9* 1.6*   Recent Labs  Lab 07/01/23 0815 07/01/23 1138 07/01/23 1645 07/01/23 2108 07/02/23 0734  GLUCAP 140* 175* 145* 118* 129*    Antimicrobials/Microbiology: Anti-infectives (From admission, onward)    None         Component Value Date/Time   SDES ABSCESS 08/13/2021 1939   SPECREQUEST RIGHT GROIN ABSCESS 08/13/2021 1939   CULT  08/13/2021 1939    FEW ARCANOBACTERIUM HAEMOLYTICUM Standardized susceptibility testing for this organism is not available. MIXED ANAEROBIC FLORA PRESENT.  CALL LAB IF FURTHER IID REQUIRED.    REPTSTATUS 08/18/2021 FINAL 08/13/2021 1939     Radiology Studies: No results found.   LOS: 4 days   Total time spent in review of labs and imaging, patient evaluation, formulation of plan, documentation and communication with patient/family: 35 minutes  Lanae Boast, MD [facility] 07/02/2023, 11:29 AM

## 2023-07-02 NOTE — Progress Notes (Signed)
 Subjective: No further bleeding.  Objective: Vital signs in last 24 hours: Temp:  [98.5 F (36.9 C)-99.8 F (37.7 C)] 98.5 F (36.9 C) (04/03 0452) Pulse Rate:  [103-110] 110 (04/03 0452) Resp:  [19-20] 19 (04/03 0452) BP: (122-147)/(82-93) 134/93 (04/03 0452) SpO2:  [88 %-94 %] 92 % (04/03 0915) Weight change:  Last BM Date : 06/29/23 (per pt)  PE: GEN:  NAD, pale ABD:  Soft, non-tender, no peritonitis  Lab Results: CBC    Component Value Date/Time   WBC 5.8 07/02/2023 0330   RBC 2.79 (L) 07/02/2023 0330   RBC 2.76 (L) 07/02/2023 0330   HGB 8.6 (L) 07/02/2023 0330   HGB 6.2 (LL) 06/23/2023 1558   HGB 13.9 02/08/2020 1128   HCT 27.8 (L) 07/02/2023 0330   HCT 40.9 02/08/2020 1128   PLT 39 (L) 07/02/2023 0330   PLT 70 (L) 06/23/2023 1558   PLT 114 (L) 02/08/2020 1128   MCV 99.6 07/02/2023 0330   MCV 97 02/08/2020 1128   MCH 30.8 07/02/2023 0330   MCHC 30.9 07/02/2023 0330   RDW 19.3 (H) 07/02/2023 0330   RDW 13.5 02/08/2020 1128   LYMPHSABS 0.6 (L) 06/28/2023 1000   MONOABS 1.0 06/28/2023 1000   EOSABS 0.1 06/28/2023 1000   BASOSABS 0.0 06/28/2023 1000  CMP     Component Value Date/Time   NA 126 (L) 07/02/2023 0330   NA 139 04/18/2022 1047   K 4.1 07/02/2023 0330   CL 98 07/02/2023 0330   CO2 21 (L) 07/02/2023 0330   GLUCOSE 118 (H) 07/02/2023 0330   BUN 14 07/02/2023 0330   BUN 18 04/18/2022 1047   CREATININE 0.53 (L) 07/02/2023 0330   CREATININE 0.66 06/23/2023 1558   CREATININE 1.07 01/04/2015 0815   CALCIUM 7.4 (L) 07/02/2023 0330   PROT 4.9 (L) 07/02/2023 0330   ALBUMIN 1.6 (L) 07/02/2023 0330   AST 63 (H) 07/02/2023 0330   AST 29 06/23/2023 1558   ALT 21 07/02/2023 0330   ALT 11 06/23/2023 1558   ALKPHOS 465 (H) 07/02/2023 0330   BILITOT 4.9 (H) 07/02/2023 0330   BILITOT 0.7 06/23/2023 1558   EGFR 85 04/18/2022 1047   GFRNONAA >60 07/02/2023 0330   GFRNONAA >60 06/23/2023 1558    Assessment:  Metastatic colon cancer (to  bone). Hematochezia.  Resolved.  Likely from colonic ulcer with visible vessel, treated endoscopically.  Etiology of ulcer could be ischemic or NSAID-mediated.  Endoscopic appearance not typical of malignancy. Acute blood loss anemia.  Plan:   Diet as tolerated. Hold off on NSAIDs if at all clinically feasible. Follow CBC. [facility] will sign-off; please call with questions; thank you for the consultation.   [name] 07/02/2023, 1:06 PM   Cell [phone] If no answer or after 5 PM call [phone]

## 2023-07-02 NOTE — Plan of Care (Signed)

## 2023-07-02 NOTE — Progress Notes (Addendum)
 [name]   DOB:11-Oct-1965   ZO#:[id number]      ASSESSMENT & PLAN:  Adenocarcinoma of colon, metastatic - Moderate to poorly differentiated - Diagnosed 01/07/2019 - Status post left colectomy 03/04/2019.  Status post FOLFOX chemotherapy from 2022 2021 x 12 cycles, completed 08/22/2019. - Subsequently started on chemotherapy regimen FOLFIRI/bevacizumab on 05/26/2022.  Completed cycle 24 on 05/06/2023. - Started on cycle 1 FOLFOX/Avastin on 06/11/2023 - Will be scheduled for cycle 2 FOLFOX next week.  Bevacizumab will be held. - Medical oncology/[name] following.  Last seen in outpatient oncology office on 06/10/2023 at which time chemotherapy was held due to severe anemia. - CODE STATUS discussed with patient and patient and wife has made decision to remain full code.     Acute rectal bleeding Acute blood loss anemia Acute on chronic anemia - Low hemoglobin likely related to lower GI bleed.  Patient reports no further rectal bleeding since colonoscopy done 06/30/2023. - Hemoglobin improving posttransfusion given yesterday, 8.6 today.     - Endoscopy and colonoscopy done 06/30/2023.  Colonoscopy prep was inadequate.  Mucosal ulceration in the distal descending colon, visible oozing blood vessel endoscopically treated.  EGD showed esophagitis with no other signs of bleeding.  GI recommended holding off on anticoagulation for at least 1 week. - Transfuse PRBC for Hgb <7.0 - Continue to monitor CBC with differential closely   Severe pain Bilateral lower extremity weakness - Improving however remains with bilateral lower extremity weakness - Secondary to extensive bone mets - Continue judicious pain management - PT evaluation   Diabetes Hypertension - Continue monitoring blood sugar levels and insulin per sliding scale - Continue antihypertensives as ordered - Monitor closely   Hyperbilirubinemia - Unclear etiology.  May be intramural medullary hemolysis or DIC.  Ordered fibrinogen  and PT/PTT, levels pending    Code Status Full  Subjective:  Patient seen awake alert and oriented x 3 laying supine in bed.  Reports no more rectal bleeding.  States that he is moving a little more in the bed although he feels like he is "forcing" himself to move.  Denies new acute complaints.  Objective:  Vitals:   07/02/23 0452 07/02/23 0915  BP: (!) 134/93   Pulse: (!) 110   Resp: 19   Temp: 98.5 F (36.9 C)   SpO2: (!) 88% 92%     Intake/Output Summary (Last 24 hours) at 07/02/2023 1201 Last data filed at 07/02/2023 1043 Gross per 24 hour  Intake 315 ml  Output 200 ml  Net 115 ml     REVIEW OF SYSTEMS:   Constitutional: + Bilateral lower extremity weakness, denies fevers, chills or abnormal night sweats Eyes: Denies blurriness of vision, double vision or watery eyes Ears, nose, mouth, throat, and face: Denies mucositis or sore throat Respiratory: Denies cough, dyspnea or wheezes Cardiovascular: Denies palpitation, chest discomfort or lower extremity swelling Gastrointestinal:  Denies nausea, heartburn or change in bowel habits Skin: Denies abnormal skin rashes Lymphatics: Denies new lymphadenopathy or easy bruising Neurological: Denies numbness, tingling or new weaknesses Behavioral/Psych: Mood is stable, no new changes  All other systems were reviewed with the patient and are negative.  PHYSICAL EXAMINATION: ECOG PERFORMANCE STATUS: 3 - Symptomatic, >50% confined to bed  Vitals:   07/02/23 0452 07/02/23 0915  BP: (!) 134/93   Pulse: (!) 110   Resp: 19   Temp: 98.5 F (36.9 C)   SpO2: (!) 88% 92%   Filed Weights   06/28/23 0929  Weight: 180  lb (81.6 kg)    GENERAL: alert, no distress and comfortable SKIN: skin color, texture, turgor are normal, no rashes or significant lesions EYES: normal, conjunctiva are pink and non-injected, sclera clear OROPHARYNX: no exudate, no erythema and lips, buccal mucosa, and tongue normal  NECK: supple, thyroid normal  size, non-tender, without nodularity LYMPH: no palpable lymphadenopathy in the cervical, axillary or inguinal LUNGS: clear to auscultation and percussion with normal breathing effort HEART: regular rate & rhythm and no murmurs and no lower extremity edema ABDOMEN: abdomen soft, non-tender and normal bowel sounds MUSCULOSKELETAL: no cyanosis of digits and no clubbing  PSYCH: alert & oriented x 3 with fluent speech NEURO: no focal motor/sensory deficits   All questions were answered. The patient knows to call the clinic with any problems, questions or concerns.   The total time spent in the appointment was 40 minutes encounter with patient including review of chart and various tests results, discussions about plan of care and coordination of care plan  [name], NP 07/02/2023 12:01 PM    Labs Reviewed:  Lab Results  Component Value Date   WBC 5.8 07/02/2023   HGB 8.6 (L) 07/02/2023   HCT 27.8 (L) 07/02/2023   MCV 99.6 07/02/2023   PLT 39 (L) 07/02/2023   Recent Labs    06/30/23 0455 07/01/23 0450 07/02/23 0330  NA 132* 133* 126*  K 3.9 4.0 4.1  CL 105 105 98  CO2 18* 20* 21*  GLUCOSE 152* 80 118*  BUN 17 16 14   CREATININE 0.60* 0.62 0.53*  CALCIUM 7.3* 7.6* 7.4*  GFRNONAA >60 >60 >60  PROT 4.9* 5.2* 4.9*  ALBUMIN 1.9* 1.9* 1.6*  AST 36 44* 63*  ALT 15 15 21   ALKPHOS 507* 465* 465*  BILITOT 2.1* 2.7* 4.9*    Studies Reviewed:  CT ANGIO GI BLEED Result Date: 06/28/2023 CLINICAL DATA:  Lower GI bleeding. Mesenteric ischemia, acute. Metastatic colon cancer EXAM: CTA ABDOMEN AND PELVIS WITHOUT AND WITH CONTRAST TECHNIQUE: Multidetector CT imaging of the abdomen and pelvis was performed using the standard protocol during bolus administration of intravenous contrast. Multiplanar reconstructed images and MIPs were obtained and reviewed to evaluate the vascular anatomy. RADIATION DOSE REDUCTION: This exam was performed according to the departmental dose-optimization program  which includes automated exposure control, adjustment of the mA and/or kV according to patient size and/or use of iterative reconstruction technique. CONTRAST:  80mL OMNIPAQUE IOHEXOL 350 MG/ML SOLN COMPARISON:  06/04/2023 FINDINGS: VASCULAR Aorta: Normal caliber aorta without aneurysm, dissection, vasculitis or significant stenosis. Mild atherosclerosis. Celiac: Patent without evidence of aneurysm, dissection, vasculitis or significant stenosis. SMA: Patent without evidence of aneurysm, dissection, vasculitis or significant stenosis. Renals: The right renal artery is patent without evidence of aneurysm, dissection, vasculitis, fibromuscular dysplasia or significant stenosis. Patient has had a left nephrectomy. IMA: The origin and proximal aspect of the inferior mesenteric artery is not identified. This does appear to be reconstituted by branches of the superior mesenteric artery. Inflow: Patent without evidence of aneurysm, dissection, vasculitis or significant stenosis. Proximal Outflow: Bilateral common femoral and visualized portions of the superficial and profunda femoral arteries are patent without evidence of aneurysm, dissection, vasculitis or significant stenosis. Veins: Major venous structures are patent. Review of the MIP images confirms the above findings. NON-VASCULAR Lower chest: Small layering left pleural effusion. Hepatobiliary: 1.1 cm hypoattenuating lesion in the periphery of the right lower lobe, not definitively seen on the prior study (series 18, image 29. Previously described subtly hypoattenuating lesions within  the liver at the gallbladder fossa and IVC are difficult to characterize but do not appear appreciably changed. Atrophy of the left hepatic lobe. Right hepatic lobe measures 22 cm in length. Small stone within the gallbladder. No biliary dilatation. Pancreas: Grossly unremarkable. Spleen: Grossly unchanged appearance of the spleen with multiple heterogeneously areas of hypoattenuation  including dominant lesion measuring approximately 11.2 x 8.5 cm. 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 Adrenals/Urinary Tract: Thickened appearance of the adrenal glands. Prior left nephrectomy. Unremarkable right kidney. No stone or hydronephrosis. Unremarkable urinary bladder. Stomach/Bowel: Tiny hiatal hernia. Stomach otherwise within normal limits. No abnormally dilated loops of bowel. Postsurgical changes from prior sigmoid resection and reanastomosis abnormal colonic wall thickening in the region of the distal sigmoid colon (series 18, image 66). There is also focal colonic wall thickening just distal to the anastomotic site (series 18, image 57). Normal appendix in the right lower quadrant (series 18, image 53). No evidence of abnormal contrast accumulation within the bowel on multiphasic imaging to localize site of active gastrointestinal bleeding. Lymphatic: Mildly prominent left external iliac chain lymph node measuring 9 mm (series 18, image 59), unchanged. No new or enlarging abdominopelvic lymph nodes. Reproductive: Prostatomegaly. Other: No ascites or pneumoperitoneum. Musculoskeletal: Extensive, widespread osseous metastatic disease, without evidence of significant progression compared to the prior study. Scoliotic spinal curvature. No pathologic fractures are seen. IMPRESSION: 1. The origin and proximal aspect of the inferior mesenteric artery is not identified. This does appear to be reconstituted by branches of the superior mesenteric artery. Otherwise, no significant vascular abnormality. 2. No evidence of abnormal contrast accumulation within the bowel to localize site of active gastrointestinal bleeding. 3. Postsurgical changes from prior sigmoid resection and reanastomosis. Abnormal colonic wall thickening in the region of the distal sigmoid colon and just distal to the anastomotic site. Findings may represent sites of focal colitis, however recurrent malignancy  is not excluded. Follow-up colonoscopy should be considered following the resolution of patient's acute symptoms. 4. New 1.1 cm hypoattenuating lesion in the periphery of the right hepatic lobe, not definitively seen on the prior study. This is concerning for a new site of metastatic disease. 5. Extensive, widespread osseous metastatic disease, without evidence of significant progression compared to the prior study. 6. Small layering left pleural effusion. 7. Cholelithiasis. 8. Aortic atherosclerosis. Electronically Signed   By: Duanne Guess D.O.   On: 06/28/2023 11:18   CT ABDOMEN PELVIS W CONTRAST Result Date: 06/04/2023 CLINICAL DATA:  Acute generalized abdominal pain. History of metastatic colon cancer. EXAM: CT ABDOMEN AND PELVIS WITH CONTRAST TECHNIQUE: Multidetector CT imaging of the abdomen and pelvis was performed using the standard protocol following bolus administration of intravenous contrast. RADIATION DOSE REDUCTION: This exam was performed according to the departmental dose-optimization program which includes automated exposure control, adjustment of the mA and/or kV according to patient size and/or use of iterative reconstruction technique. CONTRAST:  OMNIPAQUE IOHEXOL 300 MG/ML  SOLN COMPARISON:  April 04, 2023.  May 09, 2022. FINDINGS: Lower chest: No acute abnormality. Hepatobiliary: Small gallstone. No biliary dilatation. Liver is unremarkable. Pancreas: Unremarkable. No pancreatic ductal dilatation or surrounding inflammatory changes. Spleen: 11 x 9.1 cm complex lesion seen in the spleen as described on prior exam which may represent hamartoma as noted on prior studies. Adrenals/Urinary Tract: Adrenal glands are unremarkable. Status post left nephrectomy. No hydronephrosis or renal obstruction. Urinary bladder is unremarkable. Stomach/Bowel: Stomach is unremarkable. There is no evidence of bowel obstruction or inflammation. Appendix appears  normal. Vascular/Lymphatic: Aortic  atherosclerosis. No enlarged abdominal or pelvic lymph nodes. Reproductive: Prostate is unremarkable. Other: No ascites or hernia is noted. Musculoskeletal: Grossly stable sclerotic densities noted throughout skeleton consistent with osseous metastases. IMPRESSION: Small gallstone. 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 Grossly stable diffuse osseous sclerotic densities consistent with osseous metastases. Aortic Atherosclerosis (ICD10-I70.0). Electronically Signed   By: Lupita Raider M.D.   On: 06/04/2023 19:14  [name] was interviewed and examined.  He appears unchanged.  He reports stable pain.  No bleeding.  The bilirubin is more elevated today.  The etiology of the hyperbilirubinemia is unclear.  This could be related to the recent bleeding/transfusions, intramedullary hemolysis, or DIC.  The alkaline phosphatase has been chronically elevated.  He does not have a history of liver metastases or biliary tract obstruction.  I discussed CODE STATUS at length with David. Parisi again this morning.  He and his wife have discussed CODE STATUS.  He would like to remain on a full CODE STATUS.  He will not be a candidate for further chemotherapy unless his performance status improves.  The platelet count is lower during this hospital admission, potentially related to chemotherapy or progressive metastatic disease involving the bone marrow.  The hemoglobin is higher after a unit of packed red blood cells yesterday.  He is mounting a reticulocytosis. Recommendations:   1.  Check PT/PTT and fibrinogen  2.  Check fractionated bilirubin  3.  PT/OT  4.  Continue discussions with David. Catanzaro and his wife regarding CODE STATUS and goals of care

## 2023-07-02 NOTE — Progress Notes (Signed)
 Physical Therapy Treatment Patient Details Name: [name] MRN: [id number] DOB: 11/03/1965 Today's Date: 07/02/2023   History of Present Illness 58 yo male admitted with LGIB, anemia, weakness. Hx of met colon Ca, DM, DVT, discitis, endocarditis, chronc hyponatremia    PT Comments  Pt in bed when PT arrives, agreeable to session goal of sitting in recliner. Pt requests problem solving for pain at night as he is able to sleep x3-4 hours but wakes up stiff. Pt has decreased tolerance to AAROM described as bone pain and tightness in BLE. Pt educated on positional efficiencies such as raising feet of bed to flex the knee slightly prior to completing heel slide, pillow repositioning under legs, frequent, low volume exercises, changes in position while awake every 2 hours, even if slight. Pain guide of 2 point inc on pain scale to progress ROM while mitigating risk for inflammatory response. Pt is able to come to sit EOB at mod A 2 for trunk and LE assist. Maintains balance EOB. Bed height raised, STS, takes some steps forward and chair is brought behind for pt to rest. Pt completes mod A x1 STS from recliner. Handout provided for HEPs.     If plan is discharge home, recommend the following: A lot of help with walking and/or transfers;A lot of help with bathing/dressing/bathroom;Assistance with cooking/housework;Assist for transportation;Help with stairs or ramp for entrance   Can travel by private vehicle     No  Equipment Recommendations  None recommended by PT    Recommendations for Other Services       Precautions / Restrictions Precautions Precautions: Fall Precaution/Restrictions Comments: pre-medicate? Restrictions Weight Bearing Restrictions Per Provider Order: No     Mobility  Bed Mobility Overal bed mobility: Needs Assistance Bed Mobility: Supine to Sit     Supine to sit: Mod assist, HOB elevated, Used rails, +2 for physical assistance     General bed mobility  comments: Increased pain in BLEs and dec tolerance to ROM in knee flexion and hip flexion    Transfers Overall transfer level: Needs assistance Equipment used: Rolling walker (2 wheels) Transfers: Sit to/from Stand, Bed to chair/wheelchair/BSC Sit to Stand: Mod assist   Step pivot transfers: Mod assist, +2 physical assistance       General transfer comment: knee flexion with STS, pt cued to hip hinge more with knee ext to optimize knee stability    Ambulation/Gait                   Stairs             Wheelchair Mobility     Tilt Bed    Modified Rankin (Stroke Patients Only)       Balance Overall balance assessment: Needs assistance Sitting-balance support: No upper extremity supported, Feet supported Sitting balance-Leahy Scale: Fair     Standing balance support: Bilateral upper extremity supported, During functional activity, Reliant on assistive device for balance Standing balance-Leahy Scale: Poor                              Communication Communication Communication: No apparent difficulties  Cognition Arousal: Alert Behavior During Therapy: WFL for tasks assessed/performed   PT - Cognitive impairments: No apparent impairments                         Following commands: Intact      Cueing Cueing Techniques: Verbal cues  Exercises Total Joint Exercises Long Arc Quad:  (8) General Exercises - Lower Extremity Long Arc Quad: AROM, Both, Other reps (comment) (8) Heel Slides: AAROM, 5 reps, Supine    General Comments General comments (skin integrity, edema, etc.): inc pain, dec tolerance to positioning, problem solving with pt      Pertinent Vitals/Pain Pain Assessment Pain Assessment: Faces Faces Pain Scale: Hurts worst Pain Location: R shoulder, back, hips, LEs Pain Descriptors / Indicators: Grimacing, Guarding, Sharp, Aching, Stabbing, Tightness, Shooting Pain Intervention(s): Limited activity within patient's  tolerance, Monitored during session, Repositioned    Home Living                          Prior Function            PT Goals (current goals can now be found in the care plan section) Acute Rehab PT Goals Patient Stated Goal: less pain. get stronger and go home PT Goal Formulation: With patient/family Time For Goal Achievement: 07/15/23 Potential to Achieve Goals: Fair Progress towards PT goals: Progressing toward goals    Frequency    Min 2X/week      PT Plan      Co-evaluation              AM-PAC PT "6 Clicks" Mobility   Outcome Measure  Help needed turning from your back to your side while in a flat bed without using bedrails?: A Lot Help needed moving from lying on your back to sitting on the side of a flat bed without using bedrails?: A Lot Help needed moving to and from a bed to a chair (including a wheelchair)?: A Lot Help needed standing up from a chair using your arms (e.g., wheelchair or bedside chair)?: A Lot Help needed to walk in hospital room?: A Lot Help needed climbing 3-5 steps with a railing? : Total 6 Click Score: 11    End of Session Equipment Utilized During Treatment: Gait belt Activity Tolerance: Patient tolerated treatment well;Patient limited by pain Patient left: with call bell/phone within reach;with family/visitor present;in chair;with chair alarm set Nurse Communication: Mobility status PT Visit Diagnosis: Pain;Muscle weakness (generalized) (M62.81);Difficulty in walking, not elsewhere classified (R26.2) Pain - Right/Left: Left Pain - part of body: Shoulder;Hip;Knee;Leg;Ankle and joints of foot     Time: 9563-8756 PT Time Calculation (min) (ACUTE ONLY): 38 min  Charges:    $Therapeutic Exercise: 8-22 mins $Therapeutic Activity: 23-37 mins PT General Charges $$ ACUTE PT VISIT: 1 Visit                     [name], PT Acute Rehabilitation Services Office: [phone] 07/02/2023    [name] 07/02/2023,  4:28 PM

## 2023-07-03 ENCOUNTER — Inpatient Hospital Stay (HOSPITAL_COMMUNITY)

## 2023-07-03 DIAGNOSIS — K922 Gastrointestinal hemorrhage, unspecified: Secondary | ICD-10-CM | POA: Diagnosis not present

## 2023-07-03 LAB — COMPREHENSIVE METABOLIC PANEL WITH GFR
ALT: 32 U/L (ref 0–44)
AST: 105 U/L — ABNORMAL HIGH (ref 15–41)
Albumin: 1.5 g/dL — ABNORMAL LOW (ref 3.5–5.0)
Alkaline Phosphatase: 484 U/L — ABNORMAL HIGH (ref 38–126)
Anion gap: 8 (ref 5–15)
BUN: 17 mg/dL (ref 6–20)
CO2: 23 mmol/L (ref 22–32)
Calcium: 7.5 mg/dL — ABNORMAL LOW (ref 8.9–10.3)
Chloride: 97 mmol/L — ABNORMAL LOW (ref 98–111)
Creatinine, Ser: 0.41 mg/dL — ABNORMAL LOW (ref 0.61–1.24)
GFR, Estimated: >60 mL/min (ref >60)
Glucose, Bld: 214 mg/dL — ABNORMAL HIGH (ref 70–99)
Potassium: 4.1 mmol/L (ref 3.5–5.1)
Sodium: 128 mmol/L — ABNORMAL LOW (ref 135–145)
Total Bilirubin: 5.7 mg/dL — ABNORMAL HIGH (ref 0.0–1.2)
Total Protein: 4.8 g/dL — ABNORMAL LOW (ref 6.5–8.1)

## 2023-07-03 LAB — CBC
HCT: 25.9 % — ABNORMAL LOW (ref 39.0–52.0)
Hemoglobin: 8 g/dL — ABNORMAL LOW (ref 13.0–17.0)
MCH: 30.9 pg (ref 26.0–34.0)
MCHC: 30.9 g/dL (ref 30.0–36.0)
MCV: 100 fL (ref 80.0–100.0)
Platelets: 30 10*3/uL — ABNORMAL LOW (ref 150–400)
RBC: 2.59 MIL/uL — ABNORMAL LOW (ref 4.22–5.81)
RDW: 19.1 % — ABNORMAL HIGH (ref 11.5–15.5)
WBC: 6.2 10*3/uL (ref 4.0–10.5)
nRBC: 7.1 % — ABNORMAL HIGH (ref 0.0–0.2)

## 2023-07-03 LAB — BILIRUBIN, FRACTIONATED(TOT/DIR/INDIR)
Bilirubin, Direct: 3.4 mg/dL — ABNORMAL HIGH (ref 0.0–0.2)
Indirect Bilirubin: 2 mg/dL — ABNORMAL HIGH (ref 0.3–0.9)
Total Bilirubin: 5.4 mg/dL — ABNORMAL HIGH (ref 0.0–1.2)

## 2023-07-03 LAB — GLUCOSE, CAPILLARY
Glucose-Capillary: 166 mg/dL — ABNORMAL HIGH (ref 70–99)
Glucose-Capillary: 196 mg/dL — ABNORMAL HIGH (ref 70–99)
Glucose-Capillary: 173 mg/dL — ABNORMAL HIGH (ref 70–99)
Glucose-Capillary: 168 mg/dL — ABNORMAL HIGH (ref 70–99)

## 2023-07-03 LAB — MAGNESIUM: Magnesium: 1.8 mg/dL (ref 1.7–2.4)

## 2023-07-03 MED ORDER — CIPROFLOXACIN-HYDROCORTISONE 0.2-1 % OT SUSP
3.0000 [drp] | Freq: Two times a day (BID) | OTIC | Status: DC
  Administered 2023-07-03 – 2023-07-10 (×12): 3 [drp] via OTIC
  Filled 2023-07-03: qty 10

## 2023-07-03 MED ORDER — CARBAMIDE PEROXIDE 6.5 % OT SOLN
5.0000 [drp] | Freq: Two times a day (BID) | OTIC | Status: DC
  Administered 2023-07-03 – 2023-07-10 (×13): 5 [drp] via OTIC
  Filled 2023-07-03: qty 15

## 2023-07-03 NOTE — Plan of Care (Signed)

## 2023-07-03 NOTE — Progress Notes (Addendum)
   Inpatient Rehab Admissions Coordinator :  Per therapy recommendations, patient was screened for CIR candidacy by Ottie Glazier RN MSN.  At this time patient appears to be a potential candidate for CIR. I will place a rehab consult per protocol for full assessment. Please call me with any questions. Patient previously at [facility] 2015 and request another CIR admit. Noted oncology recommendations for possible home with hospice.   Ottie Glazier RN MSN Admissions Coordinator [phone]

## 2023-07-03 NOTE — TOC Progression Note (Signed)
 Transition of Care [facility]) - Progression Note    Patient Details  Name: [name] MRN: [id number] Date of Birth: Dec 07, 1965  Transition [facility]) CM/SW Contact  Larrie Kass, LCSW Phone Number: 07/03/2023, 12:05 PM  Clinical Narrative:    CSW met with the pt at bedside and the pt's spouse via phone to discuss recommendations for SNF placement. Pt and spouse inquired about inpatient rehab at [facility]. They mentioned that the pt was admitted to CIR in the past and would like to try that again. CSW suggested the pt to discuss this with PT, and CSW will reach out to the CIR coordinators to see if they can review the pt. Pt stated that if CIR is not an option, they are willing to proceed with SNF placement. TOC to follow.     Expected Discharge Plan:  (TBD) Barriers to Discharge: Continued Medical Work up  Expected Discharge Plan and Services                                               Social Determinants of Health (SDOH) Interventions SDOH Screenings   Food Insecurity: No Food Insecurity (06/28/2023)  Housing: Low Risk  (06/28/2023)  Transportation Needs: No Transportation Needs (06/28/2023)  Utilities: Not At Risk (06/28/2023)  Alcohol Screen: Low Risk  (04/28/2023)  Depression (PHQ2-9): Low Risk  (04/28/2023)  Social Connections: Socially Integrated (06/03/2023)  Tobacco Use: Low Risk  (06/28/2023)    Readmission Risk Interventions    06/29/2023   10:35 AM 05/27/2023   12:04 PM  Readmission Risk Prevention Plan  Transportation Screening Complete Complete  PCP or Specialist Appt within 5-7 Days  Complete  Home Care Screening  Complete  Medication Review (RN CM)  Complete  HRI or Home Care Consult Complete   Social Work Consult for Recovery Care Planning/Counseling Complete   Palliative Care Screening Not Applicable   Medication Review Oceanographer) Complete

## 2023-07-03 NOTE — Plan of Care (Signed)

## 2023-07-03 NOTE — Progress Notes (Signed)
 PROGRESS NOTE [name]  ZOX:[id number] DOB: 03-11-1966 DOA: 06/28/2023 PCP: Joycelyn Rua, MD  Brief Narrative/Hospital Course: 58 year old with history of colon cancer on FOLFOX, Avastin follows Dr [name], insulin-dependent DM2, HTN, HLD, DVT on Pradaxa admitted to the hospital for bright red blood per rectum/acute blood loss anemia.  Recently treated for gluteal fold cellulitis.  Last dose of Pradaxa was the night before his hospitalization.  Admission hemoglobin was noted to be 6.5 therefore PRBC transfusion was ordered.  CT angio was negative for active bleeding but showed colonic wall thickening distal to prior anastomosis.  Colonoscopy prep was overall inadequate stool was noted in sigmoid colon.  There was oozing distal descending colon blood vessel which was endoscopically treated.  EGD showed grade B esophagitis and congested duodenal mucosa but no other signs of bleeding.  For now GI recommended holding off on anticoagulation for at least 1 week. Overall remains hemodynamically stable  Consultation: Gastroenterology Oncology  Procedures/testing: E4/1 : EGD/ COLONOSCOPY (Left)-CONTROL OF HEMORRHAGE, GI TRACT, ENDOSCOPIC, BY CLIPPING OR OVERSEWING SCLEROTHERAPY   Subjective: Patient seen and examined this morning Is comfortably resting. Generalized weakness mostly in lower legs. Complains of diminished hearing/ringing sensation of the left ear Overnight afebrile, BP stable on RA Labs- sodium up at 128, ast 63>105, TB 4.9> 5.7, alt normal. Alp 465>484. CBC with hemoglobin of 8.6 > 8g plt donw 54>49>39>30.   Assessment & Plan:  BRBPR-colorectal ABLA Acute on chronic anemia Acute on chronic thrombocytopenia: B/L hB ~8.0, due to rapid decline in his hemoglobin and multiple brbpr received total 3 units of PRBC. S/P EGD colonoscopy 4/1- prep was overall inadequate,oozing distal descending colon blood vessel which was endoscopically treated. EGD showed grade B esophagitis and  congested duodenal mucosa but no other signs of bleeding. GI recommended to hold off anticoagulation at least 1 week.  Aspirin also on hold. Cont PPI BID, hb in 8 gm drifting.  Transfuse if < 7 g.  GI signed off. Cont gentle stool softener Recent Labs  Lab 06/30/23 0455 06/30/23 1640 07/01/23 0450 07/01/23 1641 07/02/23 0330  HGB 7.2* 7.4* 7.4* 8.2* 8.6*  HCT 22.4* 23.8* 23.8* 26.7* 27.8*   Diminished hearing sensation of the left/fuzziness/ringing since the left ear: Patient reports symptoms started during the night-unknown time.  He has no double vision no focal weakness numbness tingling or any other logical signs.  He is able to hear on the left ear but this appears somewhat diminished.  Will add eardrops,Nursing reports patient has been confused since getting IV Dilaudid and has been discontinued. MRI brain with and without contrast on 05/25/23 has 2 punctate foci of enhancement on the left parietal occipital region could be metastasis, no acute finding.  Will discuss with hematology if we need to scan him again. Discussed with Dr. Daine Floras he could have mets-he discussed option of hospice with the patient see above.  Colon cancer, metastatic Anemia and thrombocytopenia, bone marrow suppression from chemo On  FOLFOX and Avastin as Op. Follows with [name]. Discussed with her oncologist this morning suspect bilirubin elevation due to hemolysis from extensive bone marrow involvement with tumor, could have metastatic disease at the base of the skull affecting hearing, I discussed with the patient extensively about hospice at this point of time but is electing to enter hospice.  Continue to engage in GOC.  Oncology will follow-up  Transaminitis: Question from patient's metastatic disease.Check  US-RUQ given T. bili is trending up.?  CAD hemolysis or DIC oncology during workup with fibrinogen,  PT/PTT-INR slightly at 1.3, fibrinogen of 579, APTT normal.  Recent Labs  Lab  06/28/23 1000 06/30/23 0455 07/01/23 0450 07/02/23 0330 07/02/23 1211 07/03/23 0356  AST 41 36 44* 63*  --  105*  ALT 18 15 15 21   --  32  ALKPHOS 676* 507* 465* 465*  --  484*  BILITOT 0.9 2.1* 2.7* 4.9*  --  5.7*  BILIDIR  --   --   --  2.9*  --   --   PROT 5.5* 4.9* 5.2* 4.9*  --  4.8*  ALBUMIN 2.0* 1.9* 1.9* 1.6*  --  1.5*  INR  --   --   --   --  1.3*  --   PLT 74* 54* 49* 39*  --  30*   Severe cancer-related pain Lower extremity weakness: Patient has pain due to extensive bone metastasis and has deconditioning and weakness.  Continue pain management p.o./IV narcotics, ms contin nonnarcotics PTOT  Insulin-dependent diabetes mellitus type 2 with uncontrolled hyperglycemia: At home on insulin glargine 41 u daily, metformin 1000 twice daily and on hold. Blood sugar remains well-controlled on Semglee 20 units, SSI. Recent Labs  Lab 07/01/23 0815 07/01/23 1138 07/01/23 1645 07/01/23 2108 07/02/23 0734  GLUCAP 140* 175* 145* 118* 129*    Hypocalcemia 7.5-but s albumin low at 1.5   Hyponatremia: Sodium trending up.  Monitor  Hyperlipidemia Cont Lipitor  Essential hypertension Bo stable cont Toprol-XL.  Lisinopril on hold  History of DVT Pradaxa remains on hold due to GI bleeding- to hold x 1 week. Resume as OP.  Goals of care/CODE STATUS: Extensively discussed goals of care and encourage DNR/DNI after discussing his case with [name] by Dr Amin-Per oncology remains full code for now    DVT prophylaxis: SCDs Start: 06/28/23 1229 Code Status:   Code Status: Full Code Family Communication: plan of care discussed with patient at bedside. Patient status is: Remains hospitalized because of severity of illness Level of care: Med-Surg   Dispo: The patient is from: home            Anticipated disposition: SNF 1-2 days  Objective: Vitals last 24 hrs: Vitals:   07/02/23 1354 07/03/23 0541 07/03/23 0853 07/03/23 1217  BP: 130/83 (!) 158/87  134/71  Pulse: (!)  105 (!) 105    Resp: 18 18  18   Temp: 98.1 F (36.7 C) 99 F (37.2 C)  98.1 F (36.7 C)  TempSrc: Oral Oral  Oral  SpO2: 96% 94% 96% 95%  Weight:      Height:       Weight change:   Physical Examination: General exam: alert awake, oriented  HEENT:Oral mucosa moist, Ear/Nose WNL grossly Respiratory system: Bilaterally clear BS,no use of accessory muscle Cardiovascular system: S1 & S2 +, No JVD. Gastrointestinal system: Abdomen soft,NT,ND, BS+ Nervous System: Alert, awake, moving all extremities,and following commands. Extremities: LE edema neg,distal peripheral pulses palpable and warm.  Skin: No rashes,no icterus. MSK: Normal muscle bulk,tone, power   Medications reviewed:  Scheduled Meds:  sodium chloride   Intravenous Once   budesonide  0.25 mg Nebulization BID   carbamide peroxide  5 drop Left EAR BID   Chlorhexidine Gluconate Cloth  6 each Topical Daily   ciprofloxacin-hydrocortisone  3 drop Left EAR BID   insulin aspart  0-15 Units Subcutaneous TID WC & HS   insulin glargine-yfgn  20 Units Subcutaneous Daily   loratadine  10 mg Oral Daily   metoprolol succinate  25 mg  Oral Daily   morphine  60 mg Oral Q12H   pantoprazole (PROTONIX) IV  40 mg Intravenous Q12H  Continuous Infusions:   Diet Order             DIET DYS 3 Room service appropriate? Yes; Fluid consistency: Thin  Diet effective now                  Intake/Output Summary (Last 24 hours) at 07/03/2023 1316 Last data filed at 07/03/2023 0900 Gross per 24 hour  Intake 720 ml  Output 400 ml  Net 320 ml   Net IO Since Admission: 6,907.81 mL [07/03/23 1316]  Wt Readings from Last 3 Encounters:  06/28/23 81.6 kg  06/17/23 81.6 kg  06/10/23 85 kg     Unresulted Labs (From admission, onward)     Start     Ordered   07/03/23 1011  Bilirubin, fractionated(tot/dir/indir)  Once,   R       Question:  Specimen collection method  Answer:  IV Team=IV Team collect   07/03/23 1010   06/30/23 0500  CBC   Daily,   R     Question:  Specimen collection method  Answer:  IV Team=IV Team collect   06/29/23 0808   06/30/23 0500  Comprehensive metabolic panel with GFR  Daily,   R     Question:  Specimen collection method  Answer:  IV Team=IV Team collect   06/29/23 0808   06/30/23 0500  Magnesium  Daily,   R     Question:  Specimen collection method  Answer:  IV Team=IV Team collect   06/29/23 1610          Data Reviewed: I have personally reviewed following labs and imaging studies ( see epic result tab) CBC: Recent Labs  Lab 06/28/23 1000 06/28/23 1008 06/30/23 0455 06/30/23 1640 07/01/23 0450 07/01/23 1641 07/02/23 0330 07/02/23 1211 07/03/23 0356  WBC 4.7  --  4.6  --  5.1  --  5.8  --  6.2  NEUTROABS 2.6  --   --   --   --   --   --   --   --   HGB 6.5*   < > 7.2*   < > 7.4* 8.2* 8.6* 8.5* 8.0*  HCT 21.0*   < > 22.4*   < > 23.8* 26.7* 27.8* 27.3* 25.9*  MCV 101.0*  --  98.7  --  100.4*  --  99.6  --  100.0  PLT 74*  --  54*  --  49*  --  39*  --  30*   < > = values in this interval not displayed.   CMP: Recent Labs  Lab 06/28/23 1000 06/28/23 1008 06/30/23 0455 07/01/23 0450 07/02/23 0330 07/03/23 0356  NA 129* 130* 132* 133* 126* 128*  K 5.0 4.8 3.9 4.0 4.1 4.1  CL 99 99 105 105 98 97*  CO2 20*  --  18* 20* 21* 23  GLUCOSE 251* 242* 152* 80 118* 214*  BUN 24* 21* 17 16 14 17   CREATININE 0.62 0.60* 0.60* 0.62 0.53* 0.41*  CALCIUM 7.6*  --  7.3* 7.6* 7.4* 7.5*  MG  --   --  1.7 1.8 1.8 1.8  PHOS  --   --  3.0  --   --   --    GFR: Estimated Creatinine Clearance: 109.8 mL/min (A) (by C-G formula based on SCr of 0.41 mg/dL (L)). Recent Labs  Lab 06/28/23 1000 06/30/23  8119 07/01/23 0450 07/02/23 0330 07/03/23 0356  AST 41 36 44* 63* 105*  ALT 18 15 15 21  32  ALKPHOS 676* 507* 465* 465* 484*  BILITOT 0.9 2.1* 2.7* 4.9* 5.7*  PROT 5.5* 4.9* 5.2* 4.9* 4.8*  ALBUMIN 2.0* 1.9* 1.9* 1.6* 1.5*   Recent Labs  Lab 07/02/23 1137 07/02/23 1622 07/02/23 2049  07/03/23 0803 07/03/23 1145  GLUCAP 175* 159* 186* 166* 196*    Antimicrobials/Microbiology: Anti-infectives (From admission, onward)    None         Component Value Date/Time   SDES ABSCESS 08/13/2021 1939   SPECREQUEST RIGHT GROIN ABSCESS 08/13/2021 1939   CULT  08/13/2021 1939    FEW ARCANOBACTERIUM HAEMOLYTICUM Standardized susceptibility testing for this organism is not available. MIXED ANAEROBIC FLORA PRESENT.  CALL LAB IF FURTHER IID REQUIRED.    REPTSTATUS 08/18/2021 FINAL 08/13/2021 1939     Radiology Studies: No results found.   LOS: 5 days   Total time spent in review of labs and imaging, patient evaluation, formulation of plan, documentation and communication with patient/family: 35 minutes  Lanae Boast, MD [facility] 07/03/2023, 1:16 PM

## 2023-07-03 NOTE — Progress Notes (Addendum)
 [name]   DOB:[birth year]   WU#:[id number]      ASSESSMENT & PLAN:  Adenocarcinoma of colon, metastatic - Moderate to poorly differentiated - Diagnosed 01/07/2019 - Status post left colectomy 03/04/2019.  Status post FOLFOX chemotherapy from 2022 2021 x 12 cycles, completed 08/22/2019. - Subsequently started on chemotherapy regimen FOLFIRI/bevacizumab on 05/26/2022.  Completed cycle 24 on 05/06/2023. - Started on cycle 1 FOLFOX/Avastin on 06/11/2023 - Consideration for C2 FOLFOX without Bevacizumab previously planned, however due to rising bili may need to hold.   - CODE STATUS discussed with patient and patient and wife has made decision to remain full code. - Medical oncology/[name] following.  Last seen in outpatient oncology office on 06/10/2023 at which time chemotherapy was held due to severe anemia.   Acute rectal bleeding Acute blood loss anemia Acute on chronic anemia - Low hemoglobin likely related to lower GI bleed.  Patient reports no further rectal bleeding since colonoscopy done 06/30/2023. - Hemoglobin decreasing, 8.0 today      - Endoscopy and colonoscopy done 06/30/2023.  Colonoscopy prep was inadequate.  Mucosal ulceration in the distal descending colon, visible oozing blood vessel endoscopically treated.  EGD showed esophagitis with no other signs of bleeding.  GI recommended holding off on anticoagulation for at least 1 week. - Transfuse PRBC for Hgb <7.0 - Continue to monitor CBC with differential closely   Severe pain Bilateral lower extremity weakness - Improving however remains with bilateral lower extremity weakness - Secondary to extensive bone mets - Continue judicious pain management - PT/OT eval   Diabetes Hypertension - Continue monitoring blood sugar levels and insulin per sliding scale - Continue antihypertensives as ordered - Monitor closely   Hyperbilirubinemia Transaminitis - Unclear etiology.   -Bilirubin and LFTs continue to rise 5.7  today. - Unclear etiology. Ordered fractionated bili, results pending. - Increasing jaundice and scleral icterus. - May be intramedullary hemolysis or DIC.          Code Status Full  Subjective:  Patient seen awake and alert in bed reports moderate to severe abdominal and lower extremity pain.  States he feels he is not getting any better.  Increasing scleral icterus and jaundice skin color is noted.  No shortness of breath is noted.  No other acute complaints  Objective:  Vitals:   07/03/23 0541 07/03/23 0853  BP: (!) 158/87   Pulse: (!) 105   Resp: 18   Temp: 99 F (37.2 C)   SpO2: 94% 96%     Intake/Output Summary (Last 24 hours) at 07/03/2023 1004 Last data filed at 07/03/2023 0900 Gross per 24 hour  Intake 720 ml  Output 600 ml  Net 120 ml     REVIEW OF SYSTEMS:   Constitutional: + Bilateral lower extremity weakness, denies fevers, chills or abnormal night sweats Eyes: Denies blurriness of vision, double vision or watery eyes Ears, nose, mouth, throat, and face: Denies mucositis or sore throat Respiratory: Denies cough, dyspnea or wheezes Cardiovascular: Denies palpitation, chest discomfort or lower extremity swelling Gastrointestinal:  Denies nausea, heartburn or change in bowel habits Skin: Denies abnormal skin rashes Lymphatics: Denies new lymphadenopathy or easy bruising Neurological: Denies numbness, tingling or new weaknesses Behavioral/Psych: Mood is stable, no new changes  All other systems were reviewed with the patient and are negative.  PHYSICAL EXAMINATION: ECOG PERFORMANCE STATUS: 3 - Symptomatic, >50% confined to bed  Vitals:   07/03/23 0541 07/03/23 0853  BP: (!) 158/87   Pulse: (!) 105  Resp: 18   Temp: 99 F (37.2 C)   SpO2: 94% 96%   Filed Weights   06/28/23 0929  Weight: 180 lb (81.6 kg)    GENERAL: alert, no distress and comfortable SKIN: + Jaundiced skin color, texture, turgor are normal, no rashes or significant lesions EYES:  + Scleral icterus OROPHARYNX: no exudate, no erythema and lips, buccal mucosa, and tongue normal  NECK: supple, thyroid normal size, non-tender, without nodularity LYMPH: no palpable lymphadenopathy in the cervical, axillary or inguinal LUNGS: clear to auscultation and percussion with normal breathing effort HEART: regular rate & rhythm and no murmurs and no lower extremity edema ABDOMEN: abdomen soft, non-tender and normal bowel sounds MUSCULOSKELETAL: no cyanosis of digits and no clubbing  PSYCH: alert & oriented x 3 with fluent speech NEURO: no focal motor/sensory deficits   All questions were answered. The patient knows to call the clinic with any problems, questions or concerns.   The total time spent in the appointment was 40 minutes encounter with patient including review of chart and various tests results, discussions about plan of care and coordination of care plan  David Bills, NP 07/03/2023 10:04 AM    Labs Reviewed:  Lab Results  Component Value Date   WBC 6.2 07/03/2023   HGB 8.0 (L) 07/03/2023   HCT 25.9 (L) 07/03/2023   MCV 100.0 07/03/2023   PLT 30 (L) 07/03/2023   Recent Labs    07/01/23 0450 07/02/23 0330 07/03/23 0356  NA 133* 126* 128*  K 4.0 4.1 4.1  CL 105 98 97*  CO2 20* 21* 23  GLUCOSE 80 118* 214*  BUN 16 14 17   CREATININE 0.62 0.53* 0.41*  CALCIUM 7.6* 7.4* 7.5*  GFRNONAA >60 >60 >60  PROT 5.2* 4.9* 4.8*  ALBUMIN 1.9* 1.6* 1.5*  AST 44* 63* 105*  ALT 15 21 32  ALKPHOS 465* 465* 484*  BILITOT 2.7* 4.9* 5.7*  BILIDIR  --  2.9*  --     Studies Reviewed:  CT ANGIO GI BLEED Result Date: 06/28/2023 CLINICAL DATA:  Lower GI bleeding. Mesenteric ischemia, acute. Metastatic colon cancer EXAM: CTA ABDOMEN AND PELVIS WITHOUT AND WITH CONTRAST TECHNIQUE: Multidetector CT imaging of the abdomen and pelvis was performed using the standard protocol during bolus administration of intravenous contrast. Multiplanar reconstructed images and MIPs were  obtained and reviewed to evaluate the vascular anatomy. RADIATION DOSE REDUCTION: This exam was performed according to the departmental dose-optimization program which includes automated exposure control, adjustment of the mA and/or kV according to patient size and/or use of iterative reconstruction technique. CONTRAST:  80mL OMNIPAQUE IOHEXOL 350 MG/ML SOLN COMPARISON:  06/04/2023 FINDINGS: VASCULAR Aorta: Normal caliber aorta without aneurysm, dissection, vasculitis or significant stenosis. Mild atherosclerosis. Celiac: Patent without evidence of aneurysm, dissection, vasculitis or significant stenosis. SMA: Patent without evidence of aneurysm, dissection, vasculitis or significant stenosis. Renals: The right renal artery is patent without evidence of aneurysm, dissection, vasculitis, fibromuscular dysplasia or significant stenosis. Patient has had a left nephrectomy. IMA: The origin and proximal aspect of the inferior mesenteric artery is not identified. This does appear to be reconstituted by branches of the superior mesenteric artery. Inflow: Patent without evidence of aneurysm, dissection, vasculitis or significant stenosis. Proximal Outflow: Bilateral common femoral and visualized portions of the superficial and profunda femoral arteries are patent without evidence of aneurysm, dissection, vasculitis or significant stenosis. Veins: Major venous structures are patent. Review of the MIP images confirms the above findings. NON-VASCULAR Lower chest: Small layering left pleural  effusion. Hepatobiliary: 1.1 cm hypoattenuating lesion in the periphery of the right lower lobe, not definitively seen on the prior study (series 18, image 29. Previously described subtly hypoattenuating lesions within the liver at the gallbladder fossa and IVC are difficult to characterize but do not appear appreciably changed. Atrophy of the left hepatic lobe. Right hepatic lobe measures 22 cm in length. Small stone within the  gallbladder. No biliary dilatation. Pancreas: Grossly unremarkable. Spleen: Grossly unchanged appearance of the spleen with multiple heterogeneously areas of hypoattenuation including dominant lesion measuring approximately 11.2 x 8.5 cm. 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 Adrenals/Urinary Tract: Thickened appearance of the adrenal glands. Prior left nephrectomy. Unremarkable right kidney. No stone or hydronephrosis. Unremarkable urinary bladder. Stomach/Bowel: Tiny hiatal hernia. Stomach otherwise within normal limits. No abnormally dilated loops of bowel. Postsurgical changes from prior sigmoid resection and reanastomosis abnormal colonic wall thickening in the region of the distal sigmoid colon (series 18, image 66). There is also focal colonic wall thickening just distal to the anastomotic site (series 18, image 57). Normal appendix in the right lower quadrant (series 18, image 53). No evidence of abnormal contrast accumulation within the bowel on multiphasic imaging to localize site of active gastrointestinal bleeding. Lymphatic: Mildly prominent left external iliac chain lymph node measuring 9 mm (series 18, image 59), unchanged. No new or enlarging abdominopelvic lymph nodes. Reproductive: Prostatomegaly. Other: No ascites or pneumoperitoneum. Musculoskeletal: Extensive, widespread osseous metastatic disease, without evidence of significant progression compared to the prior study. Scoliotic spinal curvature. No pathologic fractures are seen. IMPRESSION: 1. The origin and proximal aspect of the inferior mesenteric artery is not identified. This does appear to be reconstituted by branches of the superior mesenteric artery. Otherwise, no significant vascular abnormality. 2. No evidence of abnormal contrast accumulation within the bowel to localize site of active gastrointestinal bleeding. 3. Postsurgical changes from prior sigmoid resection and reanastomosis. Abnormal colonic  wall thickening in the region of the distal sigmoid colon and just distal to the anastomotic site. Findings may represent sites of focal colitis, however recurrent malignancy is not excluded. Follow-up colonoscopy should be considered following the resolution of patient's acute symptoms. 4. New 1.1 cm hypoattenuating lesion in the periphery of the right hepatic lobe, not definitively seen on the prior study. This is concerning for a new site of metastatic disease. 5. Extensive, widespread osseous metastatic disease, without evidence of significant progression compared to the prior study. 6. Small layering left pleural effusion. 7. Cholelithiasis. 8. Aortic atherosclerosis. Electronically Signed   By: Duanne Guess D.O.   On: 06/28/2023 11:18   CT ABDOMEN PELVIS W CONTRAST Result Date: 06/04/2023 CLINICAL DATA:  Acute generalized abdominal pain. History of metastatic colon cancer. EXAM: CT ABDOMEN AND PELVIS WITH CONTRAST TECHNIQUE: Multidetector CT imaging of the abdomen and pelvis was performed using the standard protocol following bolus administration of intravenous contrast. RADIATION DOSE REDUCTION: This exam was performed according to the departmental dose-optimization program which includes automated exposure control, adjustment of the mA and/or kV according to patient size and/or use of iterative reconstruction technique. CONTRAST:  OMNIPAQUE IOHEXOL 300 MG/ML  SOLN COMPARISON:  April 04, 2023.  May 09, 2022. FINDINGS: Lower chest: No acute abnormality. Hepatobiliary: Small gallstone. No biliary dilatation. Liver is unremarkable. Pancreas: Unremarkable. No pancreatic ductal dilatation or surrounding inflammatory changes. Spleen: 11 x 9.1 cm complex lesion seen in the spleen as described on prior exam which may represent hamartoma as noted on prior studies. Adrenals/Urinary Tract: Adrenal  glands are unremarkable. Status post left nephrectomy. No hydronephrosis or renal obstruction. Urinary  bladder is unremarkable. Stomach/Bowel: Stomach is unremarkable. There is no evidence of bowel obstruction or inflammation. Appendix appears normal. Vascular/Lymphatic: Aortic atherosclerosis. No enlarged abdominal or pelvic lymph nodes. Reproductive: Prostate is unremarkable. Other: No ascites or hernia is noted. Musculoskeletal: Grossly stable sclerotic densities noted throughout skeleton consistent with osseous metastases. IMPRESSION: Small gallstone. 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 Grossly stable diffuse osseous sclerotic densities consistent with osseous metastases. Aortic Atherosclerosis (ICD10-I70.0). Electronically Signed   By: Lupita Raider M.D.   On: 06/04/2023 19:14   Mr. Mcentee was interviewed and examined.  I saw him at approximately 7:15 AM and again at approximately 3:30 PM.  His wife was at the bedside when I saw him this afternoon.  He continues to have pain.  The pain is partially relieved with MS Contin and Dilaudid.  IV Dilaudid causes somnolence. He reports no further bleeding.  He has persistent anemia/thrombocytopenia.  The bilirubin is more elevated including indirect hyperbilirubinemia.  There is no clear explanation for the hyperbilirubinemia, but in the context of extensive metastatic disease involving the bones, and elevated AST, and the peripheral blood smear findings from earlier this week; I think it is likely he has intramedullary hemolysis +/- low-grade DIC accounting for the hyperbilirubinemia and thrombocytopenia.  Mr. Fitzhenry has a poor prognosis.  He is not a candidate for further chemotherapy at present.  I do not feel he is a candidate for inpatient rehab.  I will recommend home with hospice care if his condition has not improved over the next few days.  Recommendations: Continue MS Contin and Dilaudid, add low-dose Decadron (4 mg daily) if pain persists/progresses, understanding the potential bleeding risk with Decadron Transfuse packed  red blood cells for symptomatic anemia and platelets for bleeding or a count of less than 10,000 Skilled nursing facility placement versus home with hospice Please call oncology as needed over the weekend, I will check on him before 07/06/2023 if he remains in the hospital

## 2023-07-03 NOTE — Evaluation (Signed)
 Occupational Therapy Evaluation Patient Details Name: [name] MRN: [id number] DOB: Jul 27, 1965 Today's Date: 07/03/2023   History of Present Illness   [name] is a 58 yr old male admitted to the hospital 06-28-23 with rectal bleeding and was found to have GI bleed with acute blood loss anemia. PMH: colon CA, DM II, HTN, HLD, DVT, recent gluteal fold cellulitis     Clinical Impressions The pt is currently presenting significantly below his baseline level of functioning for self-care management, as he is limited by the below listed deficits (see OT problem list); prior to ~2 weeks ago, he was modified independent to independent with ADLs and ambulation. He also reports feelings of of acute R UE weakness, as well as general deconditioning, compromised endurance/fatigue with activity, generalized pain which is more pronounced in his legs, and chronic partial numbness & tingling of his toes. As such, he requires assist for tasks, including dressing, sit to stand using a RW, and toileting. During the session today, he required assist to stand then to take lateral steps along the EOB using a RW. He expressed a desire to receive continued therapy services, in order to help him get stronger & to make maximal functional gains. Overall, he appears very motivated to get better & he also has good family support. As such, OT believes he would benefit from intensive inpatient follow-up therapy, >3 hours/day.      If plan is discharge home, recommend the following:   A lot of help with bathing/dressing/bathroom;Assist for transportation;Assistance with cooking/housework;Help with stairs or ramp for entrance;A lot of help with walking and/or transfers     Functional Status Assessment   Patient has had a recent decline in their functional status and demonstrates the ability to make significant improvements in function in a reasonable and predictable amount of time.     Equipment  Recommendations   None recommended by OT     Recommendations for Other Services         Precautions/Restrictions   Precautions Precautions: Fall Restrictions Weight Bearing Restrictions Per Provider Order: No     Mobility Bed Mobility Overal bed mobility: Needs Assistance Bed Mobility: Supine to Sit, Sit to Supine, Rolling Rolling: Mod assist   Supine to sit: Used rails, HOB elevated, Mod assist Sit to supine: Mod assist        Transfers Overall transfer level: Needs assistance Equipment used: Rolling walker (2 wheels) Transfers: Sit to/from Stand Sit to Stand: From elevated surface, Min assist           General transfer comment: Pt performed 2 sit to stand transfers from the EOB using a RW. Once in standing, he performed lateral steps along the EOB, requiring min assist for balance and cues for walker advancement      Balance       Sitting balance - Comments: static sitting-good. dynamic sitting-fair     Standing balance-Leahy Scale: Poor              ADL either performed or assessed with clinical judgement   ADL Overall ADL's : Needs assistance/impaired Eating/Feeding: Sitting;Set up   Grooming: Set up;Sitting Grooming Details (indicate cue type and reason): simulated seated EOB         Upper Body Dressing : Minimal assistance;Sitting   Lower Body Dressing: Maximal assistance;Sitting/lateral leans       Toileting- Clothing Manipulation and Hygiene: Maximal assistance Toileting - Clothing Manipulation Details (indicate cue type and reason): at bedside commode level, based on clinical  judgement             Vision   Additional Comments: He correctly read the time depicted on the wall clock.     Perception             Pertinent Vitals/Pain Pain Assessment Pain Assessment: 0-10 Pain Score: 9  Pain Location: "bones, spine, pelvis, and femur" Pain Intervention(s): Limited activity within patient's tolerance, Monitored during  session, Repositioned, Other (comment) (Pt's nurse reported giving pain medication ~1 hour before therapy session)     Extremity/Trunk Assessment Upper Extremity Assessment Upper Extremity Assessment: Right hand dominant;Generalized weakness (B UE AROM WFL, though pt reports feelings of recent onset of R UE weakness. AROM for R shoulder flexion was ~3/4 normal AROM. B UE biceps, triceps, and grip strength grossly 4-/5)   Lower Extremity Assessment Lower Extremity Assessment: Generalized weakness       Communication Communication Communication: No apparent difficulties   Cognition Arousal: Alert Behavior During Therapy: WFL for tasks assessed/performed               OT - Cognition Comments: Oriented x4, friendly, able to follow 1-2 step commands without difficulty                 Following commands: Intact                  Home Living Family/patient expects to be discharged to:: Private residence Living Arrangements: Spouse/significant other;Children (spouse and teenage son) Available Help at Discharge: Family Type of Home: House Home Access: Stairs to enter Secretary/administrator of Steps: 2   Home Layout: Two level;Able to live on main level with bedroom/bathroom Alternate Level Stairs-Number of Steps: his bedroom and a full bathroom are on the main level of the home   Bathroom Shower/Tub: Walk-in shower;Tub/shower unit         Home Equipment: Agricultural consultant (2 wheels);BSC/3in1;Shower seat;Cane - single point          Prior Functioning/Environment Prior Level of Function : Independent/Modified Independent;Driving             Mobility Comments: Prior to ~2 weeks ago, he used a RW for ambulation only when outside the home. Over the past couple weeks, he has been using a RW all the time for ambulation & his overall mobility has been limited. ADLs Comments: He has required assistance from his spouse for bathing, dressing, and toileting over the  past ~2 weeks. Prior to that, he was modified independent to independent with ADLs.    OT Problem List: Decreased strength;Decreased activity tolerance;Impaired balance (sitting and/or standing);Impaired sensation;Pain   OT Treatment/Interventions: Self-care/ADL training;Therapeutic exercise;Energy conservation;DME and/or AE instruction;Balance training;Patient/family education;Therapeutic activities      OT Goals(Current goals can be found in the care plan section)   Acute Rehab OT Goals Patient Stated Goal: to get stronger and to achieve maximal functional independence OT Goal Formulation: With patient Time For Goal Achievement: 07/17/23 Potential to Achieve Goals: Good ADL Goals Pt Will Perform Grooming: with contact guard assist;standing Pt Will Perform Lower Body Dressing: with contact guard assist;with adaptive equipment;sitting/lateral leans;sit to/from stand Pt Will Transfer to Toilet: with contact guard assist;ambulating;grab bars Pt Will Perform Toileting - Clothing Manipulation and hygiene: with contact guard assist;sit to/from stand   OT Frequency:  Min 2X/week       AM-PAC OT "6 Clicks" Daily Activity     Outcome Measure Help from another person eating meals?: None Help from another person taking care of personal  grooming?: A Little Help from another person toileting, which includes using toliet, bedpan, or urinal?: A Lot Help from another person bathing (including washing, rinsing, drying)?: A Lot Help from another person to put on and taking off regular upper body clothing?: A Little Help from another person to put on and taking off regular lower body clothing?: A Lot 6 Click Score: 16   End of Session Equipment Utilized During Treatment: Gait belt;Rolling walker (2 wheels) Nurse Communication: Patient requests pain meds  Activity Tolerance: Patient limited by pain Patient left: in bed;with call bell/phone within reach;with bed alarm set;with family/visitor  present  OT Visit Diagnosis: Unsteadiness on feet (R26.81);Muscle weakness (generalized) (M62.81);Pain;Other abnormalities of gait and mobility (R26.89)                Time: 1610-9604 OT Time Calculation (min): 34 min Charges:  OT General Charges $OT Visit: 1 Visit OT Evaluation $OT Eval Moderate Complexity: 1 Mod OT Treatments $Therapeutic Activity: 8-22 mins    Reuben Likes, OTR/L 07/03/2023, 1:37 PM

## 2023-07-03 NOTE — Progress Notes (Signed)
 PT Cancellation Note  Patient Details Name: [name] MRN: [id number] DOB: 1965-09-22   Cancelled Treatment:    Reason Eval/Treat Not Completed: Other (comment). Pt due for pain medication in the hour and going for [place]. Will continue to check back as schedule permits.    Domenick Bookbinder PT, DPT 07/03/23, 2:37 PM

## 2023-07-04 DIAGNOSIS — K922 Gastrointestinal hemorrhage, unspecified: Secondary | ICD-10-CM | POA: Diagnosis not present

## 2023-07-04 LAB — COMPREHENSIVE METABOLIC PANEL WITH GFR
ALT: 40 U/L (ref 0–44)
AST: 115 U/L — ABNORMAL HIGH (ref 15–41)
Albumin: 1.6 g/dL — ABNORMAL LOW (ref 3.5–5.0)
Alkaline Phosphatase: 534 U/L — ABNORMAL HIGH (ref 38–126)
Anion gap: 11 (ref 5–15)
BUN: 17 mg/dL (ref 6–20)
CO2: 22 mmol/L (ref 22–32)
Calcium: 7.7 mg/dL — ABNORMAL LOW (ref 8.9–10.3)
Chloride: 98 mmol/L (ref 98–111)
Creatinine, Ser: 0.48 mg/dL — ABNORMAL LOW (ref 0.61–1.24)
GFR, Estimated: >60 mL/min (ref >60)
Glucose, Bld: 128 mg/dL — ABNORMAL HIGH (ref 70–99)
Potassium: 4.1 mmol/L (ref 3.5–5.1)
Sodium: 131 mmol/L — ABNORMAL LOW (ref 135–145)
Total Bilirubin: 4.8 mg/dL — ABNORMAL HIGH (ref 0.0–1.2)
Total Protein: 5 g/dL — ABNORMAL LOW (ref 6.5–8.1)

## 2023-07-04 LAB — GLUCOSE, CAPILLARY
Glucose-Capillary: 124 mg/dL — ABNORMAL HIGH (ref 70–99)
Glucose-Capillary: 175 mg/dL — ABNORMAL HIGH (ref 70–99)
Glucose-Capillary: 216 mg/dL — ABNORMAL HIGH (ref 70–99)
Glucose-Capillary: 158 mg/dL — ABNORMAL HIGH (ref 70–99)

## 2023-07-04 LAB — CBC
HCT: 27 % — ABNORMAL LOW (ref 39.0–52.0)
Hemoglobin: 8.4 g/dL — ABNORMAL LOW (ref 13.0–17.0)
MCH: 31 pg (ref 26.0–34.0)
MCHC: 31.1 g/dL (ref 30.0–36.0)
MCV: 99.6 fL (ref 80.0–100.0)
Platelets: 25 10*3/uL — CL (ref 150–400)
RBC: 2.71 MIL/uL — ABNORMAL LOW (ref 4.22–5.81)
RDW: 19 % — ABNORMAL HIGH (ref 11.5–15.5)
WBC: 8 10*3/uL (ref 4.0–10.5)
nRBC: 9.9 % — ABNORMAL HIGH (ref 0.0–0.2)

## 2023-07-04 LAB — MAGNESIUM: Magnesium: 1.7 mg/dL (ref 1.7–2.4)

## 2023-07-04 MED ORDER — METRONIDAZOLE 500 MG/100ML IV SOLN
500.0000 mg | Freq: Two times a day (BID) | INTRAVENOUS | Status: DC
  Administered 2023-07-04 – 2023-07-11 (×15): 500 mg via INTRAVENOUS
  Filled 2023-07-04 (×15): qty 100

## 2023-07-04 MED ORDER — SENNOSIDES-DOCUSATE SODIUM 8.6-50 MG PO TABS
1.0000 | ORAL_TABLET | Freq: Two times a day (BID) | ORAL | Status: DC
  Administered 2023-07-04 – 2023-07-11 (×12): 1 via ORAL
  Filled 2023-07-04 (×14): qty 1

## 2023-07-04 MED ORDER — SODIUM CHLORIDE 0.9 % IV SOLN
2.0000 g | INTRAVENOUS | Status: AC
  Administered 2023-07-04 – 2023-07-08 (×5): 2 g via INTRAVENOUS
  Filled 2023-07-04 (×4): qty 20

## 2023-07-04 MED ORDER — LACTATED RINGERS IV SOLN: INTRAVENOUS | Status: AC

## 2023-07-04 NOTE — PMR Pre-admission (Shared)
 PMR Admission Coordinator Pre-Admission Assessment  Patient: [name] is an 58 y.o., male MRN: [id number] DOB: January 19, 1966 Height: 5\' 11"  (180.3 cm) Weight: 81.6 kg  Insurance Information HMO:     PPO: yes     PCP:      IPA:      80/20:      OTHER:  PRIMARY: BCBS COMM PPO   ; [employer] [place]   Policy#: [id number]      Subscriber: patient CM Name: via fax approval  from Angie    Phone#: [phone]     Fax [phone] Pre-Cert#: [id number] approved 4/10 until 4/16      Employer: [employer] Benefits:  Phone #: [phone]     Name: 4/9 Eff. Date: 11/30/2019     Deduct: $700      Out of Pocket Max: $3500      Life Max: none CIR: 80%      SNF: 80% 100 days per year Outpatient: $45 co pay per visit     Co-Pay: 60 visits combined Home Health: 80%      Co-Pay: 120 visits combined DME: 80%     Co-Pay: 20% Providers: in-network SECONDARY:       Policy#:      Phone#:   Artist:       Phone#:   The Data processing manager" for patients in Inpatient Rehabilitation Facilities with attached "Privacy Act Statement-Health Care Records" was provided and verbally reviewed with: Patient and Family  Emergency Contact Information Contact Information     Name Relation Home Work Mobile   [name],[name] Spouse [phone]  [phone]      Other Contacts   None on File    Current Medical History  Patient Admitting Diagnosis: GI Bleed/debility  History of Present Illness: Pt is a 58 year old male with medical hx significant for: metastatic colon CA (dx 12/2018) to the bone, DM II, HTN, hyperlipidemia, h/o DVT. Pt presented to [facility] on 06/28/23 d/t copious amount of melanotic stools.   Pt given IV fluids and pain medication. Hgb was 6.5; given 2 units PRBC. CTA negative for acute findings but showed abnormal colonic wall thickening distal to anastomosis in distal sigmoid colon. Pt recently discharged for gluteal fold cellulitis. GI consulted.  Pt underwent EGD and colonoscopy on 06/30/23 by Dr. Dulce Sellar. EGD showed esophagitis with no other signs of bleeding. Colonoscopy showed mucosal ulceration in distal descending colon, visible oozing blood vessel endoscopically treated. Oncology noted pt has persistent thrombocytopenia. Pt transfused 1 unit PRBC on 07/01/23. Pt experienced hyperbilirubinemia with unknown etiology. Pt reported diminished ability to hear out of the left ear on 4/4/. Pt with increasing jaundice and scleral icterus. US-RUQ on 4/4 showed distended gallbladder with sludge and stone borderline wall thickening. Surgery consulted for possible cholecystitis. Placed on IV antibiotics. Felt poor surgical candidate for cholecystectomy or drain for profoundly thrombocytopenic.  Oncologist discussed with patient and family possible hospice care. Remains Full code and aggressive medical management.   Patient's medical record from [facility] has been reviewed by the rehabilitation admission coordinator and physician.  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   History of hiatal hernia    Hyperlipidemia    Hypertension    Diagnostic exercise tolerance test assessment:04/30/2010 : comments normal -no evidence os ischemia by ST analysis   lipoma    Lipoma 01/26/2019   Mitral regurgitation    Echo 12/2018: EF 60-65, myxomatous mitral valve, severe mitral regurgitation, partial flail leaflet of posterior mitral valve, myxomatous tricuspid valve with  trivial TR, ascending aorta mildly dilated (39 mm)   Pneumonia    as child   PONV (postoperative nausea and vomiting)    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 Yes  Family History   family history includes Cancer in his mother; Colon cancer in an other family member; Colon cancer (age of onset: 52) in his maternal aunt; Colon cancer (age of onset: 80) in an other family member; Congestive Heart Failure in his maternal grandfather, maternal grandmother, and paternal uncle; Healthy in his sister; Heart attack in his maternal grandfather and paternal grandfather; Heart disease in his father; Hernia in his father; Hypotension in his mother; Stroke in his paternal grandmother.  Current Medications  Current Facility-Administered Medications:    0.9 %  sodium chloride infusion (Manually program via Guardrails IV Fluids), , Intravenous, Once, Willis Modena, MD   acetaminophen (TYLENOL) tablet 650 mg, 650 mg, Oral, Q6H PRN **OR** acetaminophen (TYLENOL) suppository 650 mg, 650 mg, Rectal, Q6H PRN, Willis Modena, MD   albuterol (PROVENTIL) (2.5 MG/3ML) 0.083% nebulizer solution 2.5 mg, 2.5 mg, Nebulization, Q2H PRN, Willis Modena, MD   budesonide (PULMICORT) nebulizer solution 0.25 mg, 0.25 mg, Nebulization, BID, Willis Modena, MD, 0.25 mg at 07/08/23 1010   carbamide peroxide (DEBROX) 6.5 % OTIC (EAR) solution 5 drop, 5 drop, Left EAR, BID, Kc, Ramesh, MD, 5 drop at 07/07/23 2140   Chlorhexidine Gluconate Cloth 2 % PADS 6 each, 6 each, Topical, Daily, Willis Modena, MD, 6 each at 07/08/23 1242   ciprofloxacin-hydrocortisone (CIPRO HC OTIC) 0.2-1 % OTIC (EAR)  suspension 3 drop, 3 drop, Left EAR, BID, Kc, Ramesh, MD, 3 drop at 07/07/23 2140   dexamethasone (DECADRON) tablet 4 mg, 4 mg, Oral, Daily, Rouson, Dietrich Pates, NP, 4 mg at 07/08/23 9147   guaiFENesin (ROBITUSSIN) 100 MG/5ML liquid 5 mL, 5 mL, Oral, Q4H PRN, Willis Modena, MD   hydrALAZINE (APRESOLINE) injection 10 mg, 10 mg, Intravenous, Q4H PRN, Willis Modena, MD   HYDROmorphone (DILAUDID) injection 0.25 mg, 0.25 mg, Intravenous, Q4H PRN, Kc, Ramesh, MD, 0.25 mg at 07/06/23 2143   HYDROmorphone (DILAUDID) tablet 4-8 mg, 4-8 mg, Oral, Q4H PRN, Willis Modena, MD, 8 mg at 07/08/23 1155   insulin aspart (novoLOG) injection 0-15 Units, 0-15 Units, Subcutaneous, TID WC & HS, Amin, Ankit C, MD, 8 Units at 07/08/23 1156   insulin glargine-yfgn (SEMGLEE) injection 10 Units, 10 Units, Subcutaneous, Daily, Kc, Ramesh, MD, 10 Units at 07/08/23 0927   loratadine (CLARITIN) tablet 10 mg, 10 mg, Oral, Daily,  Ladene Artist, MD, 10 mg at 07/08/23 4098   metoprolol succinate (TOPROL-XL) 24 hr tablet 25 mg, 25 mg, Oral, Daily, Willis Modena, MD, 25 mg at 07/08/23 1191   metoprolol tartrate (LOPRESSOR) injection 5 mg, 5 mg, Intravenous, Q4H PRN, Willis Modena, MD   metroNIDAZOLE (FLAGYL) IVPB 500 mg, 500 mg, Intravenous, Q12H, Kc, Ramesh, MD, Last Rate: 100 mL/hr at 07/08/23 0931, 500 mg at 07/08/23 0931   morphine (MS CONTIN) 12 hr tablet 100 mg, 100 mg, Oral, Q12H, Rouson, Dietrich Pates, NP, 100 mg at 07/08/23 0925   ondansetron (ZOFRAN) tablet 4 mg, 4 mg, Oral, Q6H PRN **OR** ondansetron (ZOFRAN) injection 4 mg, 4 mg, Intravenous, Q6H PRN, Willis Modena, MD, 4 mg at 07/07/23 0901   pantoprazole (PROTONIX) injection 40 mg, 40 mg, Intravenous, Q12H, Willis Modena, MD, 40 mg at 07/08/23 4782   polyethylene glycol (MIRALAX / GLYCOLAX) packet 17 g, 17 g, Oral, Daily PRN, Rouson, Dietrich Pates, NP   senna-docusate (Senokot-S) tablet 1 tablet, 1 tablet, Oral, BID, Kc, Ramesh, MD, 1 tablet at 07/08/23 9562   sodium  chloride flush (NS) 0.9 % injection 10-40 mL, 10-40 mL, Intracatheter, PRN, Willis Modena, MD, 10 mL at 06/30/23 1639  Patients Current Diet:  Diet Order             Diet regular Room service appropriate? Yes; Fluid consistency: Thin  Diet effective now                  Precautions / Restrictions Precautions Precautions: Fall Precaution/Restrictions Comments: pre-medicate? Restrictions Weight Bearing Restrictions Per Provider Order: No   Has the patient had 2 or more falls or a fall with injury in the past year? No  Prior Activity Level Community (5-7x/wk): prior to 2 weeks before admit, only used RW outside of the home, drove, worked until 3/25. Has required asisst of wife for 2 weeks with ADLS  Prior Functional Level Self Care: Did the patient need help bathing, dressing, using the toilet or eating? Needed some help  Indoor Mobility: Did the patient need assistance with walking from room to room (with or without device)? Independent  Stairs: Did the patient need assistance with internal or external stairs (with or without device)? Independent  Functional Cognition: Did the patient need help planning regular tasks such as shopping or remembering to take medications? Needed some help  Patient Information Are you of Hispanic, Latino/a,or Spanish origin?: A. No, not of Hispanic, Latino/a, or Spanish origin What is your race?: A. White Do you need or want an interpreter to communicate with a doctor or health care staff?: 0. No  Patient's Response To:  Health Literacy and Transportation Is the patient able to respond to health literacy and transportation needs?: Yes Health Literacy - How often do you need to have someone help you when you read instructions, pamphlets, or other written material from your doctor or pharmacy?: Never In the past 12 months, has lack of transportation kept you from medical appointments or from getting medications?: No In the past 12 months, has  lack of transportation kept you from meetings, work, or from getting things needed for daily living?: No  Home Assistive Devices / Equipment Home Equipment: Agricultural consultant (2 wheels), BSC/3in1, Information systems manager, Medical laboratory scientific officer - single point  Prior Device Use: Indicate devices/aids used by the patient prior to current illness, exacerbation or injury? Walker  Current Functional Level Cognition  Orientation Level: Oriented X4    Extremity Assessment (includes Sensation/Coordination)  Upper Extremity Assessment:  Generalized weakness, Right hand dominant  Lower Extremity Assessment: Generalized weakness    ADLs  Overall ADL's : Needs assistance/impaired Eating/Feeding: Sitting, Set up Grooming: Wash/dry face, Oral care, Sitting, Bed level, Set up Grooming Details (indicate cue type and reason): fpr EOB activity unilateral use of rail for stability and offloading pain to spine and hips Upper Body Dressing : Minimal assistance, Sitting Lower Body Dressing: Maximal assistance, Sitting/lateral leans Toileting- Clothing Manipulation and Hygiene: Maximal assistance Toileting - Clothing Manipulation Details (indicate cue type and reason): at bedside commode level, based on clinical judgement General ADL Comments: educated on breathing and BORG Scale for RPE qualification with EOB sitting for oral care 18/20 RPE and bed level face washing 6 RPE    Mobility  Overal bed mobility: Needs Assistance Bed Mobility: Sidelying to Sit Rolling: Mod assist Sidelying to sit: HOB elevated, Used rails, Min assist Supine to sit: Used rails, HOB elevated, Mod assist Sit to supine: Mod assist Sit to sidelying: Mod assist, Used rails, HOB elevated General bed mobility comments: Increased time. Cues provided. Small amount of assist provided.    Transfers  Overall transfer level: Needs assistance Equipment used: Rolling walker (2 wheels) Transfers: Sit to/from Stand Sit to Stand: Min assist, +2 safety/equipment, +2  physical assistance, From elevated surface Bed to/from chair/wheelchair/BSC transfer type:: Step pivot Step pivot transfers: Min assist, +2 physical assistance, +2 safety/equipment General transfer comment: Cues for safety, technique, hand placement, pursed lip breathing. Assist to power up, stabilize, control descent. Increased time. x 2 sit to stands during session.    Ambulation / Gait / Stairs / Wheelchair Mobility  Ambulation/Gait Ambulation/Gait assistance: Min assist, +2 safety/equipment Gait Distance (Feet): 20 Feet (x2) Assistive device: Rolling walker (2 wheels) Gait Pattern/deviations: Step-through pattern, Decreased stride length, Trunk flexed General Gait Details: Assist to stabilize pt throughout distance and for close follow with recliner. Seated reat break taken as needed. Pt tolerated distance well.    Posture / Balance Dynamic Sitting Balance Sitting balance - Comments: static sitting-good. dynamic sitting-fair Balance Overall balance assessment: Needs assistance Sitting-balance support: Single extremity supported, Feet supported Sitting balance-Leahy Scale: Fair Sitting balance - Comments: static sitting-good. dynamic sitting-fair Standing balance support: Bilateral upper extremity supported, During functional activity, Reliant on assistive device for balance Standing balance-Leahy Scale: Poor    Special needs/care consideration Non pressure wound to buttocks bilaterally 06/28/23 Port o cath access    Previous Home Environment  Living Arrangements: Spouse/significant other, Children (spouse and teenage son)  Lives With: Spouse, Son Available Help at Discharge: Family, Available 24 hours/day (wife is a Runner, broadcasting/film/video and can take FMLA , family and hired help is available as needed) Type of Home: House Home Layout: Two level, Able to live on main level with bedroom/bathroom Alternate Level Stairs-Number of Steps: his bedroom and a full bathroom are on the main level of the  home Home Access: Stairs to enter Entrance Stairs-Rails: Right Entrance Stairs-Number of Steps: 2 Bathroom Shower/Tub: Psychologist, counselling, Engineer, manufacturing systems: Pharmacist, community: Yes Home Care Services: No Additional Comments: wife works 4 minutes from home  Discharge Living Setting Plans for Discharge Living Setting: Patient's home, House, Lives with (comment) (wife and children) Type of Home at Discharge: House Discharge Home Layout: Two level, Able to live on main level with bedroom/bathroom Discharge Home Access: Stairs to enter Entrance Stairs-Rails: Right Entrance Stairs-Number of Steps: 2 Discharge Bathroom Shower/Tub: Tub/shower unit, Walk-in shower Discharge Bathroom Toilet: Standard Discharge Bathroom Accessibility: Yes How Accessible: Accessible via walker  Does the patient have any problems obtaining your medications?: No  Social/Family/Support Systems Patient Roles: Spouse, Parent (employee for [employer]) Contact Information: wife, [place] Hire Anticipated Caregiver: wife, family, can hire or take fmla Anticipated Industrial/product designer Information: see contacts Ability/Limitations of Caregiver: wife is a Research officer, political party Availability: 24/7 Discharge Plan Discussed with Primary Caregiver: Yes Is Caregiver In Agreement with Plan?: No Does Caregiver/Family have Issues with Lodging/Transportation while Pt is in Rehab?: No  Goals Patient/Family Goal for Rehab: supervision with PT and OT Expected length of stay: ELOS 10 to 12 days Additional Information: Chemo is on hold Pt/Family Agrees to Admission and willing to participate: Yes Program Orientation Provided & Reviewed with Pt/Caregiver Including Roles  & Responsibilities: Yes  Decrease burden of Care through IP rehab admission: NA  Possible need for SNF placement upon discharge: Not anticipated  Patient Condition: I have reviewed medical records from [facility], spoken with CSW, and  patient and spouse. I discussed via phone for inpatient rehabilitation assessment.  Patient will benefit from ongoing PT and OT, can actively participate in 3 hours of therapy a day 5 days of the week, and can make measurable gains during the admission.  Patient will also benefit from the coordinated team approach during an Inpatient Acute Rehabilitation admission.  The patient will receive intensive therapy as well as Rehabilitation physician, nursing, social worker, and care management interventions.  Due to bowel management, safety, skin/wound care, disease management, medication administration, pain management, and patient education the patient requires 24 hour a day rehabilitation nursing.  The patient is currently *** with mobility and basic ADLs.  Discharge setting and therapy post discharge at home with home health is anticipated.  Patient has agreed to participate in the Acute Inpatient Rehabilitation Program and will admit {Time; today/tomorrow:10263}.  Preadmission Screen Completed By:  Ottie Glazier RN MSN, 07/08/23 at 1552 ______________________________________________________________________   Discussed status with Dr. Marland Kitchen on *** at *** and received approval for admission today.  Admission Coordinator:  Ottie Glazier RN MSN, time Marland KitchenDorna Bloom ***   Assessment/Plan: Diagnosis: *** Does the need for close, 24 hr/day Medical supervision in concert with the patient's rehab needs make it unreasonable for this patient to be served in a less intensive setting? {yes_no_potentially:3041433} Co-Morbidities requiring supervision/potential complications: *** Due to {due HY:8657846}, does the patient require 24 hr/day rehab nursing? {yes_no_potentially:3041433} Does the patient require coordinated care of a physician, rehab nurse, PT, OT, and SLP to address physical and functional deficits in the context of the above medical diagnosis(es)? {yes_no_potentially:3041433} Addressing deficits in the  following areas: {deficits:3041436} Can the patient actively participate in an intensive therapy program of at least 3 hrs of therapy 5 days a week? {yes_no_potentially:3041433} The potential for patient to make measurable gains while on inpatient rehab is {potential:3041437} Anticipated functional outcomes upon discharge from inpatient rehab: {functional outcomes:304600100} PT, {functional outcomes:304600100} OT, {functional outcomes:304600100} SLP Estimated rehab length of stay to reach the above functional goals is: *** Anticipated discharge destination: {anticipated dc setting:21604} 10. Overall Rehab/Functional Prognosis: {potential:3041437}   MD Signature: ***

## 2023-07-04 NOTE — TOC Progression Note (Signed)
 Transition of Care [facility]) - Progression Note   Patient Details  Name: CRIT OBREMSKI MRN: [id number] Date of Birth: [birth year]  [facility]) CM/SW Contact  Ewing Schlein, LCSW Phone Number: 07/04/2023, 12:57 PM  Clinical Narrative: CSW spoke with Leotis Shames with CIR regarding the referral for inpatient rehab. CIR to follow patient.  Expected Discharge Plan:  (TBD) Barriers to Discharge: Continued Medical Work up  Social Determinants of Health (SDOH) Interventions SDOH Screenings   Food Insecurity: No Food Insecurity (06/28/2023)  Housing: Low Risk  (06/28/2023)  Transportation Needs: No Transportation Needs (06/28/2023)  Utilities: Not At Risk (06/28/2023)  Alcohol Screen: Low Risk  (04/28/2023)  Depression (PHQ2-9): Low Risk  (04/28/2023)  Social Connections: Socially Integrated (06/03/2023)  Tobacco Use: Low Risk  (06/28/2023)   Readmission Risk Interventions    06/29/2023   10:35 AM 05/27/2023   12:04 PM  Readmission Risk Prevention Plan  Transportation Screening Complete Complete  PCP or Specialist Appt within 5-7 Days  Complete  Home Care Screening  Complete  Medication Review (RN CM)  Complete  HRI or Home Care Consult Complete   Social Work Consult for Recovery Care Planning/Counseling Complete   Palliative Care Screening Not Applicable   Medication Review Oceanographer) Complete

## 2023-07-04 NOTE — Progress Notes (Signed)
 Inpatient Rehab Admissions:  Inpatient Rehab Consult received.  I spoke with pt's wife Belenda Cruise on the telephone for rehabilitation assessment and to discuss goals and expectations of an inpatient rehab admission.  Discussed average length of stay, insurance authorization requirement and discharge home after completion of CIR. She acknowledged understanding. She is supportive of pt pursuing CIR. She confirmed that she and a hired caregiver will be able to provide support for pt. Note oncology is monitoring medical improvement for next few days to determine discharge recommendations. Will continue to follow.   Signed: Wolfgang Phoenix, MS, CCC-SLP Admissions Coordinator [phone]

## 2023-07-04 NOTE — Plan of Care (Signed)
  Problem: Coping: Goal: Ability to adjust to condition or change in health will improve 07/04/2023 0549 by Tammi Klippel, RN Outcome: Progressing 07/04/2023 0548 by Tammi Klippel, RN Outcome: Progressing   Problem: Fluid Volume: Goal: Ability to maintain a balanced intake and output will improve Outcome: Progressing   Problem: Nutritional: Goal: Maintenance of adequate nutrition will improve Outcome: Progressing   Problem: Skin Integrity: Goal: Risk for impaired skin integrity will decrease Outcome: Progressing

## 2023-07-04 NOTE — Progress Notes (Signed)
 PROGRESS NOTE JARRYD GRATZ  WGN:[id number] DOB: [birth year] DOA: 06/28/2023 PCP: Joycelyn Rua, MD  Brief Narrative/Hospital Course: 58 year old with history of colon cancer on FOLFOX, Avastin follows Dr [name], insulin-dependent DM2, HTN, HLD, DVT on Pradaxa admitted to the hospital for bright red blood per rectum/acute blood loss anemia.  Recently treated for gluteal fold cellulitis.  Last dose of Pradaxa was the night before his hospitalization.  Admission hemoglobin was noted to be 6.5 therefore PRBC transfusion was ordered.  CT angio was negative for active bleeding but showed colonic wall thickening distal to prior anastomosis.  Colonoscopy prep was overall inadequate stool was noted in sigmoid colon.  There was oozing distal descending colon blood vessel which was endoscopically treated.  EGD showed grade B esophagitis and congested duodenal mucosa but no other signs of bleeding.  For now GI recommended holding off on anticoagulation for at least 1 week. Overall remains hemodynamically stable. Due to complaints of decreased hearing on left ear discussed with oncology question if he has metastatic spots at base of skull-advised hospice based upon his overall functional status and metastatic disease AND patient does not feel ready yet.  Consultation: Gastroenterology Oncology  Procedures/testing: E4/1 : EGD/ COLONOSCOPY (Left)-CONTROL OF HEMORRHAGE, GI TRACT, ENDOSCOPIC, BY CLIPPING OR OVERSEWING SCLEROTHERAPY   Subjective: Was seen examined, wife at bedside in am Overnight afebrile BP stable on room air Labs reviewed to be slightly down 4.8 ALT normal AST at 11 CBC with hemoglobin of 8.6 > 8g> 8.4 plt down 54>49>39>30>25.  Assessment & Plan:  BRBPR-colorectal ABLA Acute on chronic anemia Acute on chronic thrombocytopenia: B/L hB ~8.0, due to rapid decline in his hemoglobin and multiple brbpr received total 3 units of PRBC. S/P EGD colonoscopy 4/1- prep was overall  inadequate,oozing distal descending colon blood vessel which was endoscopically treated. EGD showed grade B esophagitis and congested duodenal mucosa but no other signs of bleeding. GI recommended to hold off anticoagulation at least 1 week and gi signed off Cont PPI BID, cont to hold aspirin. Hemoglobin holding stable > 8, monitor and transfuse if less than 7. Recent Labs  Lab 06/30/23 0455 06/30/23 1640 07/01/23 0450 07/01/23 1641 07/02/23 0330  HGB 7.2* 7.4* 7.4* 8.2* 8.6*  HCT 22.4* 23.8* 23.8* 26.7* 27.8*   Diminished hearing sensation of the left/fuzziness/ringing since the left ear: Patient reports symptoms started during the night-unknown time.  He has no double vision no focal weakness numbness tingling or any other logical signs.  He is able to hear on the left ear but this appears somewhat diminished.  Will add eardrops,Nursing reports patient has been confused since getting IV Dilaudid and has been discontinued. MRI brain with and without contrast on 05/25/23 has 2 punctate foci of enhancement on the left parietal occipital region could be metastasis, no acute finding.  Will discuss with hematology if we need to scan him again. Discussed with Dr. Eston Esters he could have mets-he discussed option of hospice with the patient see above.  Colon cancer, metastatic Anemia and thrombocytopenia, bone marrow suppression from chemo and metastasis On  FOLFOX and Avastin as Op. Follows with [name]. Discussed with her oncologist he suspects bilirubin elevation due to hemolysis from extensive bone marrow involvement with tumor, could have metastatic disease at the base of the skull affecting hearing,  Dr Truett Perna extensively discussed about hospice at this point of time but patient is not electing to enter hospice.Continue to engage in GOC.  Oncology will follow-up. He was to go home and not  SNF  Transaminitis Distended gallbladder with sludge and stones: -Transaminitis with  elevated AST and T. Bili- GOT [place] abd-shows distended gallbladder with sludge and stone borderline wall thickening.  Although he does not have any leukocytosis or fever, he is tender in abdomen generalized-after discussion with patient's wife wants to pursue further - start empiric antibiotics, obtain HIDA scan.  Discussed with surgery- Dr Donell Beers- who agrees with above plan.   Recent Labs  Lab 06/28/23 1000 06/30/23 0455 07/01/23 0450 07/02/23 0330 07/02/23 1211 07/03/23 0356  AST 41 36 44* 63*  --  105*  ALT 18 15 15 21   --  32  ALKPHOS 676* 507* 465* 465*  --  484*  BILITOT 0.9 2.1* 2.7* 4.9*  --  5.7*  BILIDIR  --   --   --  2.9*  --   --   PROT 5.5* 4.9* 5.2* 4.9*  --  4.8*  ALBUMIN 2.0* 1.9* 1.9* 1.6*  --  1.5*  INR  --   --   --   --  1.3*  --   PLT 74* 54* 49* 39*  --  30*   Severe cancer-related pain Lower extremity weakness: Patient has pain due to extensive bone metastasis and has deconditioning and weakness.  Continue pain management p.o./IV narcotics, ms contin nonnarcotics PTOT  Insulin-dependent diabetes mellitus type 2 with uncontrolled hyperglycemia: At home on insulin glargine 41 u daily, metformin 1000 twice daily and on hold. Well-controlled on Semglee 20 units, SSI. Recent Labs  Lab 07/01/23 0815 07/01/23 1138 07/01/23 1645 07/01/23 2108 07/02/23 0734  GLUCAP 140* 175* 145* 118* 129*    Hypocalcemia 7.5-but albumin low at 1.5   Hyponatremia: Sodium trending up.  Monitor  Hyperlipidemia Cont Lipitor  Essential hypertension Bo stable cont Toprol-XL.  Lisinopril on hold  History of DVT Pradaxa remains on hold due to GI bleeding- to hold x 1 week. Resume as OP.  Goals of care/CODE STATUS: Extensively discussed goals of care and encourage DNR/DNI after discussing his case with [name] by Dr Amin-Per oncology remains full code for now. Dr Truett Perna extensively discussed about hospice at this point of time but patient is not electing to enter  hospice.Continue to engage in GOC.     DVT prophylaxis: SCDs Start: 06/28/23 1229 Code Status:   Code Status: Full Code Family Communication: plan of care discussed with patient and wife  at bedside. Patient status is: Remains hospitalized because of severity of illness Level of care: Med-Surg   Dispo: The patient is from: Home            Anticipated disposition: TBD  Objective: Vitals last 24 hrs: Vitals:   07/03/23 2059 07/03/23 2110 07/04/23 0620 07/04/23 0633  BP: (!) 131/92   (!) 144/89  Pulse: (!) 106 (!) 104 (!) 101 (!) 110  Resp: 20 20 20 16   Temp: 98.1 F (36.7 C)   97.9 F (36.6 C)  TempSrc: Oral   Oral  SpO2: 95% 95% 94% 97%  Weight:      Height:       Weight change:   Physical Examination: General exam: alert awake, ill-appearing HEENT:Oral mucosa moist, Ear/Nose WNL grossly Respiratory system: Bilaterally clear BS,no use of accessory muscle Cardiovascular system: S1 & S2 +, No JVD. Gastrointestinal system: Abdomen soft, distended, tender diffusely , BS+ Nervous System: Alert, awake, moving all extremities,and following commands. Extremities: LE edema neg,distal peripheral pulses palpable and warm.  Skin: No rashes,+ icterus. MSK: Normal muscle bulk,tone, power  Medications reviewed:  Scheduled Meds:  sodium chloride   Intravenous Once   budesonide  0.25 mg Nebulization BID   carbamide peroxide  5 drop Left EAR BID   Chlorhexidine Gluconate Cloth  6 each Topical Daily   ciprofloxacin-hydrocortisone  3 drop Left EAR BID   insulin aspart  0-15 Units Subcutaneous TID WC & HS   insulin glargine-yfgn  20 Units Subcutaneous Daily   loratadine  10 mg Oral Daily   metoprolol succinate  25 mg Oral Daily   morphine  60 mg Oral Q12H   pantoprazole (PROTONIX) IV  40 mg Intravenous Q12H   senna-docusate  1 tablet Oral BID  Continuous Infusions:  cefTRIAXone (ROCEPHIN)  IV     lactated ringers     metronidazole       Diet Order             DIET DYS 3 Room  service appropriate? Yes; Fluid consistency: Thin  Diet effective now                  Intake/Output Summary (Last 24 hours) at 07/04/2023 1143 Last data filed at 07/03/2023 2105 Gross per 24 hour  Intake --  Output 275 ml  Net -275 ml   Net IO Since Admission: 6,632.81 mL [07/04/23 1143]  Wt Readings from Last 3 Encounters:  06/28/23 81.6 kg  06/17/23 81.6 kg  06/10/23 85 kg     Unresulted Labs (From admission, onward)     Start     Ordered   07/05/23 0500  Comprehensive metabolic panel with GFR  Daily,   R     Question:  Specimen collection method  Answer:  IV Team=IV Team collect   07/04/23 1028   06/30/23 0500  CBC  Daily,   R     Question:  Specimen collection method  Answer:  IV Team=IV Team collect   06/29/23 0808   06/30/23 0500  Magnesium  Daily,   R     Question:  Specimen collection method  Answer:  IV Team=IV Team collect   06/29/23 1610          Data Reviewed: I have personally reviewed following labs and imaging studies ( see epic result tab) CBC: Recent Labs  Lab 06/28/23 1000 06/28/23 1008 06/30/23 0455 06/30/23 1640 07/01/23 0450 07/01/23 1641 07/02/23 0330 07/02/23 1211 07/03/23 0356 07/04/23 0425  WBC 4.7  --  4.6  --  5.1  --  5.8  --  6.2 8.0  NEUTROABS 2.6  --   --   --   --   --   --   --   --   --   HGB 6.5*   < > 7.2*   < > 7.4* 8.2* 8.6* 8.5* 8.0* 8.4*  HCT 21.0*   < > 22.4*   < > 23.8* 26.7* 27.8* 27.3* 25.9* 27.0*  MCV 101.0*  --  98.7  --  100.4*  --  99.6  --  100.0 99.6  PLT 74*  --  54*  --  49*  --  39*  --  30* 25*   < > = values in this interval not displayed.   CMP: Recent Labs  Lab 06/30/23 0455 07/01/23 0450 07/02/23 0330 07/03/23 0356 07/04/23 0425  NA 132* 133* 126* 128* 131*  K 3.9 4.0 4.1 4.1 4.1  CL 105 105 98 97* 98  CO2 18* 20* 21* 23 22  GLUCOSE 152* 80 118* 214* 128*  BUN 17  16 14 17 17   CREATININE 0.60* 0.62 0.53* 0.41* 0.48*  CALCIUM 7.3* 7.6* 7.4* 7.5* 7.7*  MG 1.7 1.8 1.8 1.8 1.7  PHOS 3.0  --    --   --   --    GFR: Estimated Creatinine Clearance: 109.8 mL/min (A) (by C-G formula based on SCr of 0.48 mg/dL (L)). Recent Labs  Lab 06/30/23 0455 07/01/23 0450 07/02/23 0330 07/03/23 0356 07/03/23 1244 07/04/23 0425  AST 36 44* 63* 105*  --  115*  ALT 15 15 21  32  --  40  ALKPHOS 507* 465* 465* 484*  --  534*  BILITOT 2.1* 2.7* 4.9* 5.7* 5.4* 4.8*  PROT 4.9* 5.2* 4.9* 4.8*  --  5.0*  ALBUMIN 1.9* 1.9* 1.6* 1.5*  --  1.6*   Recent Labs  Lab 07/03/23 1145 07/03/23 1633 07/03/23 2101 07/04/23 0722 07/04/23 1121  GLUCAP 196* 173* 168* 124* 175*    Antimicrobials/Microbiology: Anti-infectives (From admission, onward)    Start     Dose/Rate Route Frequency Ordered Stop   07/04/23 1100  cefTRIAXone (ROCEPHIN) 2 g in sodium chloride 0.9 % 100 mL IVPB        2 g 200 mL/hr over 30 Minutes Intravenous Every 24 hours 07/04/23 1027 07/09/23 1059   07/04/23 1100  metroNIDAZOLE (FLAGYL) IVPB 500 mg        500 mg 100 mL/hr over 60 Minutes Intravenous Every 12 hours 07/04/23 1027           Component Value Date/Time   SDES ABSCESS 08/13/2021 1939   SPECREQUEST RIGHT GROIN ABSCESS 08/13/2021 1939   CULT  08/13/2021 1939    FEW ARCANOBACTERIUM HAEMOLYTICUM Standardized susceptibility testing for this organism is not available. MIXED ANAEROBIC FLORA PRESENT.  CALL LAB IF FURTHER IID REQUIRED.    REPTSTATUS 08/18/2021 FINAL 08/13/2021 1939     Radiology Studies: US ABDOMEN LIMITED RUQ (LIVER/GB) Result Date: 07/03/2023 CLINICAL DATA:  Transaminitis EXAM: ULTRASOUND ABDOMEN LIMITED RIGHT UPPER QUADRANT COMPARISON:  CT 06/28/2023 and older FINDINGS: Gallbladder: Distended gallbladder with sludge and stones. Borderline wall thickening of 3 mm. No sonographic Murphy's sign reported. Trace adjacent fluid suggested. Common bile duct: Diameter: 3 mm Liver: No focal lesion identified. Within normal limits in parenchymal echogenicity. Portal vein is patent on color Doppler imaging with  normal direction of blood flow towards the liver. Other: Right pleural effusion. IMPRESSION: Distended gallbladder with sludge and stones. Borderline wall thickening. Question trace adjacent fluid. No ductal dilatation. There is further concern of acute cholecystitis a HIDA scan could be considered. Right pleural effusion. Electronically Signed   By: Karen Kays M.D.   On: 07/03/2023 16:02     LOS: 6 days   Total time spent in review of labs and imaging, patient evaluation, formulation of plan, documentation and communication with patient/family: 35 minutes  Lanae Boast, MD [facility] 07/04/2023, 11:43 AM

## 2023-07-04 NOTE — Progress Notes (Signed)
 Bed alarm malfunction.  Trenden Hazelrigg, RN offered to switch out bed.  [name] declined at this time.  Continue to monitor.

## 2023-07-05 DIAGNOSIS — K922 Gastrointestinal hemorrhage, unspecified: Secondary | ICD-10-CM | POA: Diagnosis not present

## 2023-07-05 LAB — CBC
HCT: 28.3 % — ABNORMAL LOW (ref 39.0–52.0)
Hemoglobin: 8.6 g/dL — ABNORMAL LOW (ref 13.0–17.0)
MCH: 30.4 pg (ref 26.0–34.0)
MCHC: 30.4 g/dL (ref 30.0–36.0)
MCV: 100 fL (ref 80.0–100.0)
Platelets: 21 10*3/uL — CL (ref 150–400)
RBC: 2.83 MIL/uL — ABNORMAL LOW (ref 4.22–5.81)
RDW: 19.5 % — ABNORMAL HIGH (ref 11.5–15.5)
WBC: 9.2 10*3/uL (ref 4.0–10.5)
nRBC: 10.4 % — ABNORMAL HIGH (ref 0.0–0.2)

## 2023-07-05 LAB — COMPREHENSIVE METABOLIC PANEL WITH GFR
ALT: 42 U/L (ref 0–44)
AST: 113 U/L — ABNORMAL HIGH (ref 15–41)
Albumin: 1.5 g/dL — ABNORMAL LOW (ref 3.5–5.0)
Alkaline Phosphatase: 571 U/L — ABNORMAL HIGH (ref 38–126)
Anion gap: 10 (ref 5–15)
BUN: 16 mg/dL (ref 6–20)
CO2: 23 mmol/L (ref 22–32)
Calcium: 7.4 mg/dL — ABNORMAL LOW (ref 8.9–10.3)
Chloride: 98 mmol/L (ref 98–111)
Creatinine, Ser: 0.43 mg/dL — ABNORMAL LOW (ref 0.61–1.24)
GFR, Estimated: >60 mL/min (ref >60)
Glucose, Bld: 126 mg/dL — ABNORMAL HIGH (ref 70–99)
Potassium: 4 mmol/L (ref 3.5–5.1)
Sodium: 131 mmol/L — ABNORMAL LOW (ref 135–145)
Total Bilirubin: 3.9 mg/dL — ABNORMAL HIGH (ref 0.0–1.2)
Total Protein: 4.9 g/dL — ABNORMAL LOW (ref 6.5–8.1)

## 2023-07-05 LAB — GLUCOSE, CAPILLARY
Glucose-Capillary: 165 mg/dL — ABNORMAL HIGH (ref 70–99)
Glucose-Capillary: 233 mg/dL — ABNORMAL HIGH (ref 70–99)
Glucose-Capillary: 174 mg/dL — ABNORMAL HIGH (ref 70–99)
Glucose-Capillary: 151 mg/dL — ABNORMAL HIGH (ref 70–99)

## 2023-07-05 LAB — MAGNESIUM: Magnesium: 1.6 mg/dL — ABNORMAL LOW (ref 1.7–2.4)

## 2023-07-05 MED ORDER — INSULIN GLARGINE-YFGN 100 UNIT/ML ~~LOC~~ SOLN
10.0000 [IU] | Freq: Every day | SUBCUTANEOUS | Status: DC
  Administered 2023-07-06 – 2023-07-08 (×3): 10 [IU] via SUBCUTANEOUS
  Filled 2023-07-05 (×4): qty 0.1

## 2023-07-05 MED ORDER — HYDROMORPHONE HCL 1 MG/ML IJ SOLN
0.2500 mg | INTRAMUSCULAR | Status: DC | PRN
  Administered 2023-07-05 – 2023-07-06 (×6): 0.25 mg via INTRAVENOUS
  Filled 2023-07-05 (×8): qty 0.5

## 2023-07-05 NOTE — Progress Notes (Addendum)
 Physical Therapy Treatment Patient Details Name: [name] MRN: [id number] DOB: 08-22-1965 Today's Date: 07/05/2023   History of Present Illness 58 yo male admitted with LGIB, anemia, weakness. Hx of met colon Ca, DM, DVT, discitis, endocarditis, chronc hyponatremia    PT Comments  Pt and family agreeable to therapy session. Pt continues to have significant pain all over. With time and encouragement, able to progress activity on today. Min-Mod A +2 today for bed mobility, standing, and pivot transfer with RW. All mobility is painful and effortful for pt. Per chart review, AIR on case (pt/family preference)-updated PT recommendation to reflect this. Will continue to follow and progress activity as pt is able tolerate.   If plan is discharge home, recommend the following: A lot of help with walking and/or transfers;A lot of help with bathing/dressing/bathroom;Assistance with cooking/housework;Assist for transportation;Help with stairs or ramp for entrance   Can travel by private vehicle     No  Equipment Recommendations  None recommended by PT    Recommendations for Other Services       Precautions / Restrictions Precautions Precautions: Fall Precaution/Restrictions Comments: pre-medicate? Restrictions Weight Bearing Restrictions Per Provider Order: No     Mobility  Bed Mobility Overal bed mobility: Needs Assistance Bed Mobility: Rolling, Sidelying to Sit Rolling: Mod assist Sidelying to sit: Mod assist, +2 for physical assistance, +2 for safety/equipment, HOB elevated, Used rails       General bed mobility comments: Increased time. Cues provided. Assist for trunk and bil LEs.    Transfers Overall transfer level: Needs assistance Equipment used: Rolling walker (2 wheels) Transfers: Sit to/from Stand Sit to Stand: Min assist, +2 physical assistance, +2 safety/equipment, From elevated surface   Step pivot transfers: Min assist, +2 physical assistance, +2  safety/equipment       General transfer comment: Cues for safety, technique, hand placement, pursed lip breathing. Assist to power up, stabilize, control descent. Increased time. Task is effortful for pt. Required some encouragement to progress activity beyond standing at bedside.    Ambulation/Gait                   Stairs             Wheelchair Mobility     Tilt Bed    Modified Rankin (Stroke Patients Only)       Balance Overall balance assessment: Needs assistance         Standing balance support: Bilateral upper extremity supported, During functional activity, Reliant on assistive device for balance Standing balance-Leahy Scale: Poor                              Communication Communication Communication: No apparent difficulties  Cognition Arousal: Alert Behavior During Therapy: WFL for tasks assessed/performed, Anxious   PT - Cognitive impairments: No apparent impairments                         Following commands: Intact      Cueing Cueing Techniques: Verbal cues  Exercises      General Comments        Pertinent Vitals/Pain Pain Assessment Pain Assessment: Faces Faces Pain Scale: Hurts whole lot Pain Location: "bones, spine, pelvis, and femur" Pain Descriptors / Indicators: Grimacing, Guarding, Sharp, Aching, Stabbing, Tightness, Shooting Pain Intervention(s): Limited activity within patient's tolerance, Monitored during session    Home Living  Prior Function            PT Goals (current goals can now be found in the care plan section) Progress towards PT goals: Progressing toward goals    Frequency    Min 2X/week      PT Plan      Co-evaluation              AM-PAC PT "6 Clicks" Mobility   Outcome Measure  Help needed turning from your back to your side while in a flat bed without using bedrails?: A Lot Help needed moving from lying on your back to  sitting on the side of a flat bed without using bedrails?: A Lot Help needed moving to and from a bed to a chair (including a wheelchair)?: A Lot Help needed standing up from a chair using your arms (e.g., wheelchair or bedside chair)?: A Lot Help needed to walk in hospital room?: A Lot Help needed climbing 3-5 steps with a railing? : Total 6 Click Score: 11    End of Session Equipment Utilized During Treatment: Gait belt Activity Tolerance: Patient tolerated treatment well;Patient limited by pain Patient left: with call bell/phone within reach;with family/visitor present;in chair;with chair alarm set Nurse Communication: Mobility status PT Visit Diagnosis: Pain;Muscle weakness (generalized) (M62.81);Difficulty in walking, not elsewhere classified (R26.2) Pain - part of body: Shoulder;Hip;Knee;Leg;Ankle and joints of foot     Time: 1024-1040 PT Time Calculation (min) (ACUTE ONLY): 16 min  Charges:    $Therapeutic Activity: 8-22 mins PT General Charges $$ ACUTE PT VISIT: 1 Visit                        Faye Ramsay, PT Acute Rehabilitation  Office: [phone]

## 2023-07-05 NOTE — Progress Notes (Addendum)
 Wound care completed per Wound Care Order.  Patient participated in care and tolerated treated well.

## 2023-07-05 NOTE — Plan of Care (Signed)
   Problem: Coping: Goal: Ability to adjust to condition or change in health will improve Outcome: Progressing   Problem: Health Behavior/Discharge Planning: Goal: Ability to manage health-related needs will improve Outcome: Progressing   Problem: Metabolic: Goal: Ability to maintain appropriate glucose levels will improve Outcome: Progressing   Problem: Nutritional: Goal: Maintenance of adequate nutrition will improve Outcome: Progressing

## 2023-07-05 NOTE — Consult Note (Signed)
 Reason for Consult:possible cholecytitis Referring Physician: Dr. Lanae Boast  [name] is an 58 y.o. male.  HPI: This is a 58 year old gentleman who is known to CCS status post multiple surgeries his medical history is significant for metastatic colon cancer who is currently on chemotherapy.  He was admitted to the hospital on 3/30 with acute GI bleed.  He was not having any abdominal pain and other than an elevated alkaline phosphatase, his bilirubin, AST, and ALT were normal.  He has had upper and lower endoscopies as well as CT angiogram.  He has required transfusions of packed red blood cells and his hemoglobin is stabilized.  During the week, he has had a significant increase in his liver function tests with a bilirubin as high as 5.7 which is now down to 3.9.  He has also developed right upper quadrant abdominal pain.  Initially the rising bilirubin was felt to be secondary to chemotherapy.  He underwent an ultrasound yesterday showing a distended gallbladder with sludge and stones with trace pericholecystic fluid and some borderline gallbladder wall thickening.  He has had no previous gallbladder issues.  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   History of hiatal hernia    Hyperlipidemia    Hypertension    Diagnostic exercise tolerance test assessment:04/30/2010 : comments normal -no evidence os ischemia by ST analysis   lipoma    Lipoma 01/26/2019   Mitral regurgitation    Echo 12/2018: EF 60-65, myxomatous mitral  valve, severe mitral regurgitation, partial flail leaflet of posterior mitral valve, myxomatous tricuspid valve with trivial TR, ascending aorta mildly dilated (39 mm)   Pneumonia    as child   PONV (postoperative nausea and vomiting)    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   Complex valvuloplasty including artificial Gore-tex neochord placement x6 with 28 mm Sorin Memo 4D ring annuloplasty via right mini thoracotomy approach   Solitary kidney    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    Past Surgical History:  Procedure Laterality Date   ABDOMINAL AORTOGRAM N/A 02/10/2020   Procedure: ABDOMINAL AORTOGRAM;  Surgeon: Corky Crafts, MD;  Location: [facility] INVASIVE CV LAB;  Service: Cardiovascular;  Laterality: N/A;   APPLICATION OF WOUND VAC  08/15/2021   Procedure: APPLICATION OF WOUND VAC;  Surgeon: Emelia Loron, MD;  Location: [facility] OR;  Service: General;;   BIOPSY  01/07/2019   Procedure: BIOPSY;  Surgeon: Charlott Rakes, MD;  Location: [facility]OS[facility];  Service: Endoscopy;;   BIOPSY  01/03/2020   Procedure: BIOPSY;  Surgeon: Charlott Rakes, MD;  Location: [facility];  Service: Endoscopy;;   COLON SURGERY  03/04/2019   left colon segmental resection   COLONOSCOPY Left 06/30/2023   Procedure: COLONOSCOPY;  Surgeon: Willis Modena, MD;  Location: [facility];  Service: Gastroenterology;  Laterality: Left;   COLONOSCOPY WITH PROPOFOL N/A 01/07/2019   Procedure:  COLONOSCOPY WITH PROPOFOL;  Surgeon: Charlott Rakes, MD;  Location: [facility];  Service: Endoscopy;  Laterality: N/A;   COLONOSCOPY WITH PROPOFOL N/A 01/03/2020   Procedure: COLONOSCOPY WITH PROPOFOL;  Surgeon: Charlott Rakes, MD;  Location: [facility];  Service: Endoscopy;  Laterality: N/A;   ESOPHAGOGASTRODUODENOSCOPY Left 06/30/2023   Procedure: EGD (ESOPHAGOGASTRODUODENOSCOPY);  Surgeon: Willis Modena, MD;  Location: [facility];  Service: Gastroenterology;   Laterality: Left;   HEMOSTASIS CLIP PLACEMENT  06/30/2023   Procedure: CONTROL OF HEMORRHAGE, GI TRACT, ENDOSCOPIC, BY CLIPPING OR OVERSEWING;  Surgeon: Willis Modena, MD;  Location: [facility];  Service: Gastroenterology;;   INCISION AND DRAINAGE OF WOUND Right 08/13/2021   Procedure: IRRIGATION AND DEBRIDEMENT WOUND RIGHT GROIN;  Surgeon: Axel Filler, MD;  Location: [facility] OR;  Service: General;  Laterality: Right;   INGUINAL HERNIA REPAIR Left 04/23/2016   Procedure: LAPAROSCOPIC  INGUINAL REPAIR;  Surgeon: Berna Bue, MD;  Location: [facility] OR;  Service: General;  Laterality: Left;   IR IMAGING GUIDED PORT INSERTION  05/08/2022   IRRIGATION AND DEBRIDEMENT ABSCESS Right 08/15/2021   Procedure: IRRIGATION AND DEBRIDEMENT OF GROIN;  Surgeon: Emelia Loron, MD;  Location: [facility] OR;  Service: General;  Laterality: Right;   IRRIGATION AND DEBRIDEMENT SEBACEOUS CYST N/A 03/19/2023   Procedure: DEBRIDEMENT OF CHRONIC INFECTION;  Surgeon: Romie Levee, MD;  Location: [facility];  Service: General;  Laterality: N/A;   KNEE SURGERY     arthroscopic left knee   LIPOMA EXCISION  2015   x20 [facility]   MITRAL VALVE REPAIR Right 05/09/2020   Procedure: MINIMALLY INVASIVE MITRAL VALVE REPAIR (MVR) USING LIVANOVA MEMO 4D MITRAL RING;  Surgeon: Purcell Nails, MD;  Location: [facility] OR;  Service: Open Heart Surgery;  Laterality: Right;   POLYPECTOMY  01/03/2020   Procedure: POLYPECTOMY;  Surgeon: Charlott Rakes, MD;  Location: [facility];  Service: Endoscopy;;   PORT-A-CATH REMOVAL     PORTACATH PLACEMENT N/A 03/16/2019   Procedure: INSERTION PORT-A-CATH;  Surgeon: Karie Soda, MD;  Location: [facility] ORS;  Service: General;  Laterality: N/A;   RIGHT/LEFT HEART CATH AND CORONARY ANGIOGRAPHY N/A 02/10/2020   Procedure: RIGHT/LEFT HEART CATH AND CORONARY ANGIOGRAPHY;  Surgeon: Corky Crafts, MD;  Location[facility] INVASIVE CV LAB;  Service: Cardiovascular;  Laterality: N/A;    SCLEROTHERAPY  06/30/2023   Procedure: SCLEROTHERAPY;  Surgeon: Willis Modena, MD;  Location: [facility];  Service: Gastroenterology;;   SUBMUCOSAL INJECTION  01/07/2019   Procedure: SUBMUCOSAL INJECTION;  Surgeon: Charlott Rakes, MD;  Location: [facility];  Service: Endoscopy;;   TEE WITHOUT CARDIOVERSION N/A 02/10/2020   Procedure: TRANSESOPHAGEAL ECHOCARDIOGRAM (TEE);  Surgeon: Elease Hashimoto Deloris Ping, MD;  Location: [facility] ENDOSCOPY;  Service: Cardiovascular;  Laterality: N/A;  CATH AFTER TEE   TEE WITHOUT CARDIOVERSION N/A 05/09/2020   Procedure: TRANSESOPHAGEAL ECHOCARDIOGRAM (TEE);  Surgeon: Purcell Nails, MD;  Location: [facility] OR;  Service: Open Heart Surgery;  Laterality: N/A;   THYROIDECTOMY, PARTIAL  01/05/2009   Right Thyroid Lobectomy   TONSILLECTOMY     TOTAL NEPHRECTOMY Left 1970   Wilms Tumor left kidney excised as an infant    Family History  Problem Relation Age of Onset   Heart disease Father    Hernia Father    Healthy Sister    Stroke Paternal Grandmother    Cancer Mother        THYROID   Hypotension Mother    Heart attack Maternal Grandfather    Congestive Heart Failure Maternal Grandfather  Heart attack Paternal Grandfather    Colon cancer Maternal Aunt 79   Congestive Heart Failure Paternal Uncle    Congestive Heart Failure Maternal Grandmother    Colon cancer Other 66       MGMs brother   Colon cancer Other        MGFs brother    Social History:  reports that he has never smoked. He has never used smokeless tobacco. He reports that he does not drink alcohol and does not use drugs.  Allergies:  Allergies  Allergen Reactions   Cat Dander Anaphylaxis   Empagliflozin Other (See Comments)    Recurrent UTI    Medications: I have reviewed the patient's current medications.  Results for orders placed or performed during the hospital encounter of 06/28/23 (from the past 48 hours)  Glucose, capillary     Status: Abnormal   Collection Time: 07/03/23 11:45 AM   Result Value Ref Range   Glucose-Capillary 196 (H) 70 - 99 mg/dL    Comment: Glucose reference range applies only to samples taken after fasting for at least 8 hours.  Bilirubin, fractionated(tot/dir/indir)     Status: Abnormal   Collection Time: 07/03/23 12:44 PM  Result Value Ref Range   Total Bilirubin 5.4 (H) 0.0 - 1.2 mg/dL   Bilirubin, Direct 3.4 (H) 0.0 - 0.2 mg/dL   Indirect Bilirubin 2.0 (H) 0.3 - 0.9 mg/dL    Comment: Performed at [facility], [address], [place], [place] [address]  Glucose, capillary     Status: Abnormal   Collection Time: 07/03/23  4:33 PM  Result Value Ref Range   Glucose-Capillary 173 (H) 70 - 99 mg/dL    Comment: Glucose reference range applies only to samples taken after fasting for at least 8 hours.  Glucose, capillary     Status: Abnormal   Collection Time: 07/03/23  9:01 PM  Result Value Ref Range   Glucose-Capillary 168 (H) 70 - 99 mg/dL    Comment: Glucose reference range applies only to samples taken after fasting for at least 8 hours.  CBC     Status: Abnormal   Collection Time: 07/04/23  4:25 AM  Result Value Ref Range   WBC 8.0 4.0 - 10.5 K/uL   RBC 2.71 (L) 4.22 - 5.81 MIL/uL   Hemoglobin 8.4 (L) 13.0 - 17.0 g/dL   HCT 46.9 (L) 62.9 - 52.8 %   MCV 99.6 80.0 - 100.0 fL   MCH 31.0 26.0 - 34.0 pg   MCHC 31.1 30.0 - 36.0 g/dL   RDW 41.3 (H) 24.4 - 01.0 %   Platelets 25 (LL) 150 - 400 K/uL    Comment: SPECIMEN CHECKED FOR CLOTS Immature Platelet Fraction may be clinically indicated, consider ordering this additional test [id number] REPEATED TO VERIFY THIS CRITICAL RESULT HAS VERIFIED AND BEEN CALLED TO [place] BY PATRICIA LUZOLO ON 04 05 2025 AT 0524, AND HAS BEEN READ BACK. CRITICAL RESULT VERIFIED    nRBC 9.9 (H) 0.0 - 0.2 %    Comment: Performed at [facility], [address], [place], [place] [address]  Comprehensive metabolic panel with GFR     Status: Abnormal   Collection Time:  07/04/23  4:25 AM  Result Value Ref Range   Sodium 131 (L) 135 - 145 mmol/L   Potassium 4.1 3.5 - 5.1 mmol/L   Chloride 98 98 - 111 mmol/L   CO2 22 22 - 32 mmol/L   Glucose, Bld 128 (H) 70 -  99 mg/dL    Comment: Glucose reference range applies only to samples taken after fasting for at least 8 hours.   BUN 17 6 - 20 mg/dL   Creatinine, Ser 8.29 (L) 0.61 - 1.24 mg/dL   Calcium 7.7 (L) 8.9 - 10.3 mg/dL   Total Protein 5.0 (L) 6.5 - 8.1 g/dL   Albumin 1.6 (L) 3.5 - 5.0 g/dL   AST 562 (H) 15 - 41 U/L   ALT 40 0 - 44 U/L   Alkaline Phosphatase 534 (H) 38 - 126 U/L   Total Bilirubin 4.8 (H) 0.0 - 1.2 mg/dL   GFR, Estimated >13 >08 mL/min    Comment: (NOTE) Calculated using the CKD-EPI Creatinine Equation (2021)    Anion gap 11 5 - 15    Comment: Performed at [facility], [address], [place], [place] [address]  Magnesium     Status: None   Collection Time: 07/04/23  4:25 AM  Result Value Ref Range   Magnesium 1.7 1.7 - 2.4 mg/dL    Comment: Performed at [facility], [address] [place], [place] [address]  Glucose, capillary     Status: Abnormal   Collection Time: 07/04/23  7:22 AM  Result Value Ref Range   Glucose-Capillary 124 (H) 70 - 99 mg/dL    Comment: Glucose reference range applies only to samples taken after fasting for at least 8 hours.  Glucose, capillary     Status: Abnormal   Collection Time: 07/04/23 11:21 AM  Result Value Ref Range   Glucose-Capillary 175 (H) 70 - 99 mg/dL    Comment: Glucose reference range applies only to samples taken after fasting for at least 8 hours.  Glucose, capillary     Status: Abnormal   Collection Time: 07/04/23  5:13 PM  Result Value Ref Range   Glucose-Capillary 216 (H) 70 - 99 mg/dL    Comment: Glucose reference range applies only to samples taken after fasting for at least 8 hours.  Glucose, capillary     Status: Abnormal   Collection Time: 07/04/23  8:24 PM  Result Value Ref Range    Glucose-Capillary 158 (H) 70 - 99 mg/dL    Comment: Glucose reference range applies only to samples taken after fasting for at least 8 hours.  CBC     Status: Abnormal   Collection Time: 07/05/23  3:48 AM  Result Value Ref Range   WBC 9.2 4.0 - 10.5 K/uL   RBC 2.83 (L) 4.22 - 5.81 MIL/uL   Hemoglobin 8.6 (L) 13.0 - 17.0 g/dL   HCT 52.8 (L) 41.3 - 24.4 %   MCV 100.0 80.0 - 100.0 fL   MCH 30.4 26.0 - 34.0 pg   MCHC 30.4 30.0 - 36.0 g/dL   RDW 01.0 (H) 27.2 - 53.6 %   Platelets 21 (LL) 150 - 400 K/uL    Comment: Immature Platelet Fraction may be clinically indicated, consider ordering this additional test [id number] CRITICAL VALUE NOTED.  VALUE IS CONSISTENT WITH PREVIOUSLY REPORTED AND CALLED VALUE. REPEATED TO VERIFY    nRBC 10.4 (H) 0.0 - 0.2 %    Comment: Performed at [facility], [address], [place], [place] [address]  Magnesium     Status: Abnormal   Collection Time: 07/05/23  3:48 AM  Result Value Ref Range   Magnesium 1.6 (L) 1.7 - 2.4 mg/dL    Comment: Performed at [facility], [address] [place], [place] [address]  Comprehensive metabolic panel with GFR     Status: Abnormal   Collection Time: 07/05/23  3:48 AM  Result Value Ref Range   Sodium 131 (L) 135 - 145 mmol/L   Potassium 4.0 3.5 - 5.1 mmol/L   Chloride 98 98 - 111 mmol/L   CO2 23 22 - 32 mmol/L   Glucose, Bld 126 (H) 70 - 99 mg/dL    Comment: Glucose reference range applies only to samples taken after fasting for at least 8 hours.   BUN 16 6 - 20 mg/dL   Creatinine, Ser 1.19 (L) 0.61 - 1.24 mg/dL   Calcium 7.4 (L) 8.9 - 10.3 mg/dL   Total Protein 4.9 (L) 6.5 - 8.1 g/dL   Albumin 1.5 (L) 3.5 - 5.0 g/dL   AST 147 (H) 15 - 41 U/L   ALT 42 0 - 44 U/L   Alkaline Phosphatase 571 (H) 38 - 126 U/L   Total Bilirubin 3.9 (H) 0.0 - 1.2 mg/dL   GFR, Estimated >82 >95 mL/min    Comment: (NOTE) Calculated using the CKD-EPI Creatinine Equation (2021)    Anion gap 10 5 -  15    Comment: Performed at [facility], [address], [place], [place] [address]  Glucose, capillary     Status: Abnormal   Collection Time: 07/05/23  7:41 AM  Result Value Ref Range   Glucose-Capillary 165 (H) 70 - 99 mg/dL    Comment: Glucose reference range applies only to samples taken after fasting for at least 8 hours.    US ABDOMEN LIMITED RUQ (LIVER/GB) Result Date: 07/03/2023 CLINICAL DATA:  Transaminitis EXAM: ULTRASOUND ABDOMEN LIMITED RIGHT UPPER QUADRANT COMPARISON:  CT 06/28/2023 and older FINDINGS: Gallbladder: Distended gallbladder with sludge and stones. Borderline wall thickening of 3 mm. No sonographic Murphy's sign reported. Trace adjacent fluid suggested. Common bile duct: Diameter: 3 mm Liver: No focal lesion identified. Within normal limits in parenchymal echogenicity. Portal vein is patent on color Doppler imaging with normal direction of blood flow towards the liver. Other: Right pleural effusion. IMPRESSION: Distended gallbladder with sludge and stones. Borderline wall thickening. Question trace adjacent fluid. No ductal dilatation. There is further concern of acute cholecystitis a HIDA scan could be considered. Right pleural effusion. Electronically Signed   By: Karen Kays M.D.   On: 07/03/2023 16:02    Review of Systems  All other systems reviewed and are negative.  Blood pressure (!) 141/91, pulse (!) 101, temperature 98.4 F (36.9 C), temperature source Oral, resp. rate 18, height 5\' 11"  (1.803 m), weight 81.6 kg, SpO2 93%. Physical Exam Constitutional:      General: He is not in acute distress.    Appearance: He is diaphoretic.  HENT:     Head: Normocephalic and atraumatic.  Eyes:     General: Scleral icterus present.  Cardiovascular:     Rate and Rhythm: Regular rhythm. Tachycardia present.  Pulmonary:     Effort: Pulmonary effort is normal. No respiratory distress.  Abdominal:     Comments: His abdomen is a little distended but  there is tenderness and guarding in the right upper quadrant  Skin:    General: Skin is warm.     Coloration: Skin is jaundiced.  Neurological:     General: No focal deficit present.     Mental Status: He is alert.  Psychiatric:        Mood and Affect: Mood is anxious.        Behavior: Behavior normal.  Assessment/Plan: Elevated liver function tests and findings on ultrasound of possible cholecystitis with gallstones  I am worried he may have developed cholecystitis during this admission and he may have a Mirizzi's syndrome going on.  A HIDA scan has been ordered but unfortunately may not be able to be done today.  I will make him n.p.o. at midnight.  If he does have cholecystitis then he would likely need a percutaneous cholecystostomy tube as he is a moderately poor surgical candidate for cholecystectomy.  He is also profoundly thrombocytopenic.  For now, IV antibiotics will be continued.  We will follow closely with you  Complex medical decision making  Abigail Miyamoto 07/05/2023, 9:07 AM

## 2023-07-05 NOTE — Progress Notes (Addendum)
 PROGRESS NOTE [name]  ZOX:[id number] DOB: Apr 06, 1965 DOA: 06/28/2023 PCP: Joycelyn Rua, MD  Brief Narrative/Hospital Course: 58 year old with history of colon cancer on FOLFOX, Avastin follows Dr [name], insulin-dependent DM2, HTN, HLD, DVT on Pradaxa admitted to the hospital for bright red blood per rectum/acute blood loss anemia.  Recently treated for gluteal fold cellulitis.  Last dose of Pradaxa was the night before his hospitalization.  Admission hemoglobin was noted to be 6.5 therefore PRBC transfusion was ordered.  CT angio was negative for active bleeding but showed colonic wall thickening distal to prior anastomosis.  Colonoscopy prep was overall inadequate stool was noted in sigmoid colon.  There was oozing distal descending colon blood vessel which was endoscopically treated.  EGD showed grade B esophagitis and congested duodenal mucosa but no other signs of bleeding.  For now GI recommended holding off on anticoagulation for at least 1 week. Overall remains hemodynamically stable. Due to complaints of decreased hearing on left ear discussed with oncology question if he has metastatic spots at base of skull-advised hospice based upon his overall functional status and metastatic disease AND patient does not feel ready yet.  Consultation: Gastroenterology Oncology  Procedures/testing: E4/1 : EGD/ COLONOSCOPY (Left)-CONTROL OF HEMORRHAGE, GI TRACT, ENDOSCOPIC, BY CLIPPING OR OVERSEWING SCLEROTHERAPY   Subjective: Seen and examined this morning Wife at the bedside Patient at the bedside complains of ongoing pain more on the right upper quadrant today Overnight mild tachycardia BP stable on room air afebrile Labs reviewedALT normal AST 115>169m TB slightly down. CBC with hemoglobin of 8.6 > 8g> 8.4> 8.6, plt down 54>49>39>30>25>21k. Last BM 4/5  Assessment & Plan:  BRBPR-colorectal ABLA Acute on chronic anemia Acute on chronic thrombocytopenia: B/L hB ~8.0, due to  rapid decline in his hemoglobin and multiple brbpr received total 3 units of PRBC. S/P EGD colonoscopy 4/1- prep was overall inadequate,oozing distal descending colon blood vessel which was endoscopically treated.  EGD showed grade B esophagitis and congested duodenal mucosa but no other signs of bleeding. GI recommended to hold off anticoagulation at least 1 week and gi signed off Cont PPI BID, cont to hold aspirin. Hemoglobin holding stable > 8, continue to monitor.  Recent Labs  Lab 07/02/23 0330 07/02/23 1211 07/03/23 0356 07/04/23 0425 07/05/23 0348  HGB 8.6* 8.5* 8.0* 8.4* 8.6*  HCT 27.8* 27.3* 25.9* 27.0* 28.3*   Colon cancer, metastatic Anemia and thrombocytopenia, bone marrow suppression from chemo and metastasis On  FOLFOX and Avastin as Op. Follows with [name]. Discussed with  oncologist he suspects bilirubin elevation due to hemolysis from extensive bone marrow involvement with tumor, could have metastatic disease ?  Biliary disease. Dr Truett Perna extensively discussed about hospice at this point of time but patient is not electing to enter hospice. Continue to engage in GOC.  Oncology will follow-up. He was to go home and not SNF.  Transaminitis Distended gallbladder with sludge and stones Possible cholecystitis: -Transaminitis with elevated AST and T. Bili- GOT [place] abd-shows distended gallbladder with sludge and stone borderline wall thickening.  Although he does not have any leukocytosis or fever, he is tender in abdomen generalized-after discussion with patient's wife wants to pursue further-surgery was consulted-I discussed the plan of care with Dr. Donell Beers 4/5 and agreed with plan for empiric antibiotics, HIDA scan. Input appreciated from Dr. Magnus Ivan this morning continue empiric ceftriaxone Flagyl. PO diet per ccs Per CCS he is moderately poor surgical candidate for cholecystectomy, profoundly thrombocytopenic-may need cholecystostomy tube if he has positive HIDA  scan.  ALT normal AST remains elevated T. bili slightly down as below.  He remains hemodynamically stable Recent Labs  Lab 07/01/23 0450 07/02/23 0330 07/02/23 1211 07/03/23 0356 07/03/23 1244 07/04/23 0425 07/05/23 0348  AST 44* 63*  --  105*  --  115* 113*  ALT 15 21  --  32  --  40 42  ALKPHOS 465* 465*  --  484*  --  534* 571*  BILITOT 2.7* 4.9*  --  5.7* 5.4* 4.8* 3.9*  BILIDIR  --  2.9*  --   --  3.4*  --   --   IBILI  --   --   --   --  2.0*  --   --   PROT 5.2* 4.9*  --  4.8*  --  5.0* 4.9*  ALBUMIN 1.9* 1.6*  --  1.5*  --  1.6* 1.5*  INR  --   --  1.3*  --   --   --   --   PLT 49* 39*  --  30*  --  25* 21*   Diminished hearing sensation of the left/fuzziness/ringing since the left ear: Patient reports symptoms started during the night-unknown time.  He has no double vision no focal weakness numbness tingling or any other logical signs.  He is able to hear on the left ear but this appears somewhat diminished.  Will add eardrops,Nursing reports patient has been confused since getting IV Dilaudid and has been discontinued. MRI brain with and without contrast on 05/25/23 has 2 punctate foci of enhancement on the left parietal occipital region could be metastasis, no acute finding.  Will discuss with hematology if we need to scan him again. Discussed with Dr. Eston Esters he could have mets-he discussed option of hospice with the patient see above.  Patient reports hearing improving on current eardrops   Severe cancer-related pain Lower extremity weakness: Patient has pain due to extensive bone metastasis and has deconditioning and weakness. Continue pain management : MS Contin twice daily,  prn p.o./IV narcotics PTOT OOB as able  IDDMtype 2 with uncontrolled hyperglycemia: At home on insulin glargine 41 u daily, metformin 1000 twice daily and on hold. Well-controlled on Semglee 20 units, SSI and since keeping NPO will decrease Semglee to 10 units. Recent Labs  Lab  07/04/23 1121 07/04/23 1713 07/04/23 2024 07/05/23 0741 07/05/23 1140  GLUCAP 175* 216* 158* 165* 233*    Hypocalcemia 7.4-but albumin low at 1.5-corrected calcium 9.7  Hyponatremia: Sodium trending up.  Monitor Recent Labs  Lab 07/01/23 0450 07/02/23 0330 07/03/23 0356 07/04/23 0425 07/05/23 0348  NA 133* 126* 128* 131* 131*    Hyperlipidemia Hold Lipitor  Essential hypertension BP stable on Toprol-XL 25 mg- add holding parameters. Lisinopril on hold  History of DVT Pradaxa remains on hold due to GI bleeding- to hold x 1 week.Resume as OP.  Goals of care/CODE STATUS: Extensively discussed goals of care and encourage DNR/DNI after discussing his case with [name] by Dr Amin-Per oncology remains full code for now. Dr Truett Perna extensively discussed about hospice at this point of time but patient is not electing to enter hospice.Continue to engage in GOC.   Patient having ongoing GI issues at this time concern for acute cholecystitis, appears to be not a ideal candidate for cholecystectomy, pending further workup Patient remains high risk for decompensation, and readmission  DVT prophylaxis: SCDs Start: 06/28/23 1229 Code Status:   Code Status: Full Code Family Communication: plan of care discussed with  patient  and his wife at bedside. Patient status is: Remains hospitalized because of severity of illness Level of care: Med-Surg   Dispo: The patient is from: Home            Anticipated disposition: TBD  Objective: Vitals last 24 hrs: Vitals:   07/04/23 2200 07/05/23 0437 07/05/23 0604 07/05/23 0912  BP: (!) 140/85 (!) 141/91  139/89  Pulse: (!) 108 (!) 110 (!) 101 (!) 107  Resp:  16 18   Temp: 98 F (36.7 C) 98.4 F (36.9 C)    TempSrc:  Oral    SpO2: 97% 93% 93%   Weight:      Height:       Weight change:   Physical Examination: General exam: alert awake, oriented, sick looking, older than stated age HEENT:Oral mucosa moist, Ear/Nose WNL  grossly Respiratory system: Bilaterally clear BS,no use of accessory muscle Cardiovascular system: S1 & S2 +, No JVD. Gastrointestinal system: Abdomen soft, mildly distended, diffuse tenderness more on right upper quadrant  BS+ Nervous System: Alert, awake, moving all extremities,and following commands. Extremities: LE edema trace, distal peripheral pulses palpable and warm.  Skin: No rashes,no icterus. MSK: Normal muscle bulk,tone, power   Medications reviewed:  Scheduled Meds:  sodium chloride   Intravenous Once   budesonide  0.25 mg Nebulization BID   carbamide peroxide  5 drop Left EAR BID   Chlorhexidine Gluconate Cloth  6 each Topical Daily   ciprofloxacin-hydrocortisone  3 drop Left EAR BID   insulin aspart  0-15 Units Subcutaneous TID WC & HS   [START ON 07/06/2023] insulin glargine-yfgn  10 Units Subcutaneous Daily   loratadine  10 mg Oral Daily   metoprolol succinate  25 mg Oral Daily   morphine  60 mg Oral Q12H   pantoprazole (PROTONIX) IV  40 mg Intravenous Q12H   senna-docusate  1 tablet Oral BID  Continuous Infusions:  cefTRIAXone (ROCEPHIN)  IV 2 g (07/04/23 1541)   metronidazole Stopped (07/05/23 0023)     Diet Order             Diet NPO time specified  Diet effective midnight           DIET DYS 3 Room service appropriate? Yes; Fluid consistency: Thin  Diet effective now                  Intake/Output Summary (Last 24 hours) at 07/05/2023 1156 Last data filed at 07/05/2023 0600 Gross per 24 hour  Intake 1085.96 ml  Output 600 ml  Net 485.96 ml   Net IO Since Admission: 6,968.77 mL [07/05/23 1156]  Wt Readings from Last 3 Encounters:  06/28/23 81.6 kg  06/17/23 81.6 kg  06/10/23 85 kg     Unresulted Labs (From admission, onward)     Start     Ordered   07/05/23 0500  Comprehensive metabolic panel with GFR  Daily,   R     Question:  Specimen collection method  Answer:  IV Team=IV Team collect   07/04/23 1028   06/30/23 0500  CBC  Daily,   R      Question:  Specimen collection method  Answer:  IV Team=IV Team collect   06/29/23 1610   06/30/23 0500  Magnesium  Daily,   R     Question:  Specimen collection method  Answer:  IV Team=IV Team collect   06/29/23 9604          Data Reviewed: I have personally  reviewed following labs and imaging studies ( see epic result tab) CBC: Recent Labs  Lab 07/01/23 0450 07/01/23 1641 07/02/23 0330 07/02/23 1211 07/03/23 0356 07/04/23 0425 07/05/23 0348  WBC 5.1  --  5.8  --  6.2 8.0 9.2  HGB 7.4*   < > 8.6* 8.5* 8.0* 8.4* 8.6*  HCT 23.8*   < > 27.8* 27.3* 25.9* 27.0* 28.3*  MCV 100.4*  --  99.6  --  100.0 99.6 100.0  PLT 49*  --  39*  --  30* 25* 21*   < > = values in this interval not displayed.   CMP: Recent Labs  Lab 06/30/23 0455 07/01/23 0450 07/02/23 0330 07/03/23 0356 07/04/23 0425 07/05/23 0348  NA 132* 133* 126* 128* 131* 131*  K 3.9 4.0 4.1 4.1 4.1 4.0  CL 105 105 98 97* 98 98  CO2 18* 20* 21* 23 22 23   GLUCOSE 152* 80 118* 214* 128* 126*  BUN 17 16 14 17 17 16   CREATININE 0.60* 0.62 0.53* 0.41* 0.48* 0.43*  CALCIUM 7.3* 7.6* 7.4* 7.5* 7.7* 7.4*  MG 1.7 1.8 1.8 1.8 1.7 1.6*  PHOS 3.0  --   --   --   --   --    GFR: Estimated Creatinine Clearance: 109.8 mL/min (A) (by C-G formula based on SCr of 0.43 mg/dL (L)). Recent Labs  Lab 07/01/23 0450 07/02/23 0330 07/03/23 0356 07/03/23 1244 07/04/23 0425 07/05/23 0348  AST 44* 63* 105*  --  115* 113*  ALT 15 21 32  --  40 42  ALKPHOS 465* 465* 484*  --  534* 571*  BILITOT 2.7* 4.9* 5.7* 5.4* 4.8* 3.9*  PROT 5.2* 4.9* 4.8*  --  5.0* 4.9*  ALBUMIN 1.9* 1.6* 1.5*  --  1.6* 1.5*   Recent Labs  Lab 07/04/23 1121 07/04/23 1713 07/04/23 2024 07/05/23 0741 07/05/23 1140  GLUCAP 175* 216* 158* 165* 233*    Antimicrobials/Microbiology: Anti-infectives (From admission, onward)    Start     Dose/Rate Route Frequency Ordered Stop   07/04/23 1100  cefTRIAXone (ROCEPHIN) 2 g in sodium chloride 0.9 % 100 mL  IVPB        2 g 200 mL/hr over 30 Minutes Intravenous Every 24 hours 07/04/23 1027 07/09/23 1059   07/04/23 1100  metroNIDAZOLE (FLAGYL) IVPB 500 mg        500 mg 100 mL/hr over 60 Minutes Intravenous Every 12 hours 07/04/23 1027           Component Value Date/Time   SDES ABSCESS 08/13/2021 1939   SPECREQUEST RIGHT GROIN ABSCESS 08/13/2021 1939   CULT  08/13/2021 1939    FEW ARCANOBACTERIUM HAEMOLYTICUM Standardized susceptibility testing for this organism is not available. MIXED ANAEROBIC FLORA PRESENT.  CALL LAB IF FURTHER IID REQUIRED.    REPTSTATUS 08/18/2021 FINAL 08/13/2021 1939     Radiology Studies: US ABDOMEN LIMITED RUQ (LIVER/GB) Result Date: 07/03/2023 CLINICAL DATA:  Transaminitis EXAM: ULTRASOUND ABDOMEN LIMITED RIGHT UPPER QUADRANT COMPARISON:  CT 06/28/2023 and older FINDINGS: Gallbladder: Distended gallbladder with sludge and stones. Borderline wall thickening of 3 mm. No sonographic Murphy's sign reported. Trace adjacent fluid suggested. Common bile duct: Diameter: 3 mm Liver: No focal lesion identified. Within normal limits in parenchymal echogenicity. Portal vein is patent on color Doppler imaging with normal direction of blood flow towards the liver. Other: Right pleural effusion. IMPRESSION: Distended gallbladder with sludge and stones. Borderline wall thickening. Question trace adjacent fluid. No ductal dilatation. There is further concern  of acute cholecystitis a HIDA scan could be considered. Right pleural effusion. Electronically Signed   By: Karen Kays M.D.   On: 07/03/2023 16:02     LOS: 7 days   Total time spent in review of labs and imaging, patient evaluation, formulation of plan, documentation and communication with patient/family: 50 minutes  Lanae Boast, MD [facility] 07/05/2023, 11:56 AM

## 2023-07-06 DIAGNOSIS — K922 Gastrointestinal hemorrhage, unspecified: Secondary | ICD-10-CM | POA: Diagnosis not present

## 2023-07-06 LAB — COMPREHENSIVE METABOLIC PANEL WITH GFR
ALT: 45 U/L — ABNORMAL HIGH (ref 0–44)
AST: 119 U/L — ABNORMAL HIGH (ref 15–41)
Albumin: 1.7 g/dL — ABNORMAL LOW (ref 3.5–5.0)
Alkaline Phosphatase: 648 U/L — ABNORMAL HIGH (ref 38–126)
Anion gap: 10 (ref 5–15)
BUN: 18 mg/dL (ref 6–20)
CO2: 22 mmol/L (ref 22–32)
Calcium: 7.4 mg/dL — ABNORMAL LOW (ref 8.9–10.3)
Chloride: 98 mmol/L (ref 98–111)
Creatinine, Ser: 0.51 mg/dL — ABNORMAL LOW (ref 0.61–1.24)
GFR, Estimated: >60 mL/min (ref >60)
Glucose, Bld: 95 mg/dL (ref 70–99)
Potassium: 3.8 mmol/L (ref 3.5–5.1)
Sodium: 130 mmol/L — ABNORMAL LOW (ref 135–145)
Total Bilirubin: 3.4 mg/dL — ABNORMAL HIGH (ref 0.0–1.2)
Total Protein: 5 g/dL — ABNORMAL LOW (ref 6.5–8.1)

## 2023-07-06 LAB — CBC
HCT: 28.7 % — ABNORMAL LOW (ref 39.0–52.0)
Hemoglobin: 9 g/dL — ABNORMAL LOW (ref 13.0–17.0)
MCH: 31.3 pg (ref 26.0–34.0)
MCHC: 31.4 g/dL (ref 30.0–36.0)
MCV: 99.7 fL (ref 80.0–100.0)
Platelets: 20 10*3/uL — CL (ref 150–400)
RBC: 2.88 MIL/uL — ABNORMAL LOW (ref 4.22–5.81)
RDW: 19.9 % — ABNORMAL HIGH (ref 11.5–15.5)
WBC: 10.5 10*3/uL (ref 4.0–10.5)
nRBC: 10.5 % — ABNORMAL HIGH (ref 0.0–0.2)

## 2023-07-06 LAB — GLUCOSE, CAPILLARY
Glucose-Capillary: 103 mg/dL — ABNORMAL HIGH (ref 70–99)
Glucose-Capillary: 110 mg/dL — ABNORMAL HIGH (ref 70–99)
Glucose-Capillary: 223 mg/dL — ABNORMAL HIGH (ref 70–99)
Glucose-Capillary: 217 mg/dL — ABNORMAL HIGH (ref 70–99)

## 2023-07-06 LAB — MAGNESIUM: Magnesium: 1.6 mg/dL — ABNORMAL LOW (ref 1.7–2.4)

## 2023-07-06 MED ORDER — MORPHINE SULFATE ER 100 MG PO TBCR: 100.0000 mg | EXTENDED_RELEASE_TABLET | Freq: Two times a day (BID) | ORAL | Status: DC

## 2023-07-06 MED ORDER — DEXAMETHASONE 4 MG PO TABS
4.0000 mg | ORAL_TABLET | Freq: Every day | ORAL | Status: DC
  Administered 2023-07-06 – 2023-07-11 (×6): 4 mg via ORAL
  Filled 2023-07-06 (×6): qty 1

## 2023-07-06 MED ORDER — KETOROLAC TROMETHAMINE 30 MG/ML IJ SOLN
30.0000 mg | Freq: Once | INTRAMUSCULAR | Status: AC
  Administered 2023-07-06: 30 mg via INTRAVENOUS
  Filled 2023-07-06: qty 1

## 2023-07-06 MED ORDER — MORPHINE SULFATE ER 100 MG PO TBCR
100.0000 mg | EXTENDED_RELEASE_TABLET | Freq: Two times a day (BID) | ORAL | Status: DC
  Administered 2023-07-06 – 2023-07-11 (×11): 100 mg via ORAL
  Filled 2023-07-06 (×13): qty 1

## 2023-07-06 MED ORDER — MAGNESIUM SULFATE 2 GM/50ML IV SOLN
2.0000 g | Freq: Once | INTRAVENOUS | Status: AC
Start: 2023-07-06 — End: 2023-07-06
  Administered 2023-07-06: 2 g via INTRAVENOUS
  Filled 2023-07-06: qty 50

## 2023-07-06 NOTE — Progress Notes (Signed)
 Inpatient Rehab Admissions Coordinator:  Pt is not medically ready for CIR. Will continue to follow.   Wolfgang Phoenix, MS, CCC-SLP Admissions Coordinator [phone]

## 2023-07-06 NOTE — Progress Notes (Signed)
 6 Days Post-Op  Subjective: Patient complaining of terrible bony pain in his hips/back/shoulders.  He has some abdominal pain, but states this is generally overshadowed by his bony pain.  Takes chronic MS contin and this is being adjusted due to worsening pain.  No N/V.  Pain in abd is great on right side, but is noted to be diffuse.  ROS: See above, otherwise other systems negative  Objective: Vital signs in last 24 hours: Temp:  [99.1 F (37.3 C)-99.4 F (37.4 C)] 99.1 F (37.3 C) (04/07 0325) Pulse Rate:  [104-110] 110 (04/07 0325) Resp:  [16-18] 18 (04/07 0325) BP: (145-155)/(90-98) 155/95 (04/07 0325) SpO2:  [94 %-97 %] 95 % (04/07 0917) Last BM Date : 07/04/23  Intake/Output from previous day: 04/06 0701 - 04/07 0700 In: 450 [P.O.:240; IV Piggyback:210] Out: 250 [Urine:250] Intake/Output this shift: No intake/output data recorded.  PE: Gen: mild discomfort laying in bed Lungs: effort seems a bit increased, but patient states this is his norm Abd: soft, mild diffuse tenderness, but greatest on right side, +BS  Lab Results:  Recent Labs    07/05/23 0348 07/06/23 0348  WBC 9.2 10.5  HGB 8.6* 9.0*  HCT 28.3* 28.7*  PLT 21* 20*   BMET Recent Labs    07/05/23 0348 07/06/23 0348  NA 131* 130*  K 4.0 3.8  CL 98 98  CO2 23 22  GLUCOSE 126* 95  BUN 16 18  CREATININE 0.43* 0.51*  CALCIUM 7.4* 7.4*   PT/INR No results for input(s): "LABPROT", "INR" in the last 72 hours. CMP     Component Value Date/Time   NA 130 (L) 07/06/2023 0348   NA 139 04/18/2022 1047   K 3.8 07/06/2023 0348   CL 98 07/06/2023 0348   CO2 22 07/06/2023 0348   GLUCOSE 95 07/06/2023 0348   BUN 18 07/06/2023 0348   BUN 18 04/18/2022 1047   CREATININE 0.51 (L) 07/06/2023 0348   CREATININE 0.66 06/23/2023 1558   CREATININE 1.07 01/04/2015 0815   CALCIUM 7.4 (L) 07/06/2023 0348   PROT 5.0 (L) 07/06/2023 0348   ALBUMIN 1.7 (L) 07/06/2023 0348   AST 119 (H) 07/06/2023 0348   AST  29 06/23/2023 1558   ALT 45 (H) 07/06/2023 0348   ALT 11 06/23/2023 1558   ALKPHOS 648 (H) 07/06/2023 0348   BILITOT 3.4 (H) 07/06/2023 0348   BILITOT 0.7 06/23/2023 1558   GFRNONAA >60 07/06/2023 0348   GFRNONAA >60 06/23/2023 1558   GFRAA 115 02/08/2020 1128   GFRAA >60 08/22/2019 1118   Lipase     Component Value Date/Time   LIPASE 33 08/13/2021 1235       Studies/Results: No results found.  Anti-infectives: Anti-infectives (From admission, onward)    Start     Dose/Rate Route Frequency Ordered Stop   07/04/23 1100  cefTRIAXone (ROCEPHIN) 2 g in sodium chloride 0.9 % 100 mL IVPB        2 g 200 mL/hr over 30 Minutes Intravenous Every 24 hours 07/04/23 1027 07/09/23 1059   07/04/23 1100  metroNIDAZOLE (FLAGYL) IVPB 500 mg        500 mg 100 mL/hr over 60 Minutes Intravenous Every 12 hours 07/04/23 1027          Assessment/Plan Metastatic colon cancer with bony mets, cholelithiasis, possible cholecystitis -some concern over the weekend for cholecystitis given some rise in his LFTs and abdominal pain, worse on right side - alk phos can been chronically elevated due  to his bony mets, but TB and AST/ALT elevation new over the last several days. -[place] with stones, distention, and wall thickening of only 3mm.  No ductal dilatation, etc.  Equivocal for cholecystitis.  HIDA ordered, but on chronic pain meds which puts him at risk for a false positive result. -if his HIDA were positive we would recommend a perc chole drain, which also seems high risk given his plts are only 20K and this is a transhepatic approach generally.   -this patient has been thoroughly discussed this morning between myself, [name], Dr. Jonathon Bellows, and Dr. Amil Amen with radiology. -I think best approach right now is to hold on HIDA scan and treat with abx therapy for possible cholecystitis.  [name] has discussed that the patient is a candidate for hospice and has a poor prognosis, but the patient and wife  have been hesitant to this.  Given this and his other issues at listed above, we continue with conservative treatment.  D/w Dr. Derrell Lolling as well. -may have CLD from our standpoint and continue Rocephin/flagyl for now.  FEN - CLD VTE - on hold due to anemia, thrombocytopenia ID - Rocephin/Flagyl  ABL anemia Thrombocytopenia HTN HLD DM  I reviewed Consultant oncology notes, hospitalist notes, last 24 h vitals and pain scores, last 48 h intake and output, last 24 h labs and trends, and last 24 h imaging results.   LOS: 8 days    Letha Cape , [facility] 07/06/2023, 10:13 AM Please see Amion for pager number during day hours 7:00am-4:30pm or 7:00am -11:30am on weekends

## 2023-07-06 NOTE — Progress Notes (Addendum)
 [name]   DOB:12/28/1965   YN#:[id number]      ASSESSMENT & PLAN:  Adenocarcinoma of colon, metastatic - Moderate to poorly differentiated - Diagnosed 01/07/2019 - Status post left colectomy 03/04/2019.  Status post FOLFOX chemotherapy from 2022 2021 x 12 cycles, completed 08/22/2019. - Subsequently started on chemotherapy regimen FOLFIRI/bevacizumab on 05/26/2022.  Completed cycle 24 on 05/06/2023. - Started on cycle 1 FOLFOX/Avastin on 06/11/2023 - Consideration for C2 FOLFOX without Bevacizumab previously planned, however due to rising bili may need to hold.   - CODE STATUS discussed with patient and patient and wife has made decision to remain full code.  Continue goals of care discussion with patient and family. - Medical oncology/[name] following.  Seen outpatient oncology 06/10/2023, chemotherapy held due to severe anemia.   Acute on chronic thrombocytopenia - decreasing platelets - Recommend transfusion of platelets for counts <20 K or <50 K with active bleeding - Hold off anticoagulation - continue monitoring cbc with diff  Acute rectal bleeding Acute blood loss anemia Acute on chronic anemia - Low hemoglobin likely related to lower GI bleed.  Patient reports no further rectal bleeding since colonoscopy done 06/30/2023. - Hemoglobin improving 9.0 today - Endoscopy and colonoscopy done 06/30/2023.  Colonoscopy prep was inadequate.  Mucosal ulceration in the distal descending colon, visible oozing blood vessel endoscopically treated.  EGD showed esophagitis with no other signs of bleeding.  GI recommended holding off on anticoagulation for at least 1 week. - Transfuse PRBC for Hgb <7.0 - Continue to monitor CBC with differential closely  Hyperbilirubinemia Transaminitis Possible cholecystitis - Unclear etiology.  Jaundice and scleral icterus improving.  -Bilirubin and LFTs now decreasing 3.4 today - Previously scheduled for HIDA scan.  On hold for now. - Continue  antibiotics with Rocephin and Flagyl as ordered. - Monitor closely  Severe pain Bilateral lower extremity weakness - remains c/o persistent bone pain in hips, shoulders and bilateral lower extremities with weakness  - Secondary to extensive bone mets - Continue judicious pain management.  Increased MS contin to 100 mg bid and continue dilaudid for breakthrough pain. Added decadron 4 mg po daily.   - PT/OT eval   Diabetes Hypertension - Continue monitoring blood sugar levels and insulin per sliding scale - Continue antihypertensives as ordered - Monitor closely        Code Status Full  Subjective:  Patient seen awake alert and oriented x 3 laying supine in bed.  Reports that he has increasing pain in his hips shoulders and both legs which is worse now than the abdominal right-sided pain.  No bleeding.  Objective:  Vitals:   07/06/23 0917 07/06/23 1010  BP:  (!) 153/95  Pulse:  (!) 102  Resp:  16  Temp:  98.6 F (37 C)  SpO2: 95% 96%     Intake/Output Summary (Last 24 hours) at 07/06/2023 1031 Last data filed at 07/06/2023 0300 Gross per 24 hour  Intake 450 ml  Output 250 ml  Net 200 ml     REVIEW OF SYSTEMS:   Constitutional: + Generalized pain hips shoulders bilateral lower extremities, denies fevers, chills or abnormal night sweats Eyes: Denies blurriness of vision, double vision or watery eyes Ears, nose, mouth, throat, and face: Denies mucositis or sore throat Respiratory: Denies cough, dyspnea or wheezes Cardiovascular: Denies palpitation, chest discomfort or lower extremity swelling Gastrointestinal: + Abdominal pain right worse than left Skin: Denies abnormal skin rashes Lymphatics: Denies new lymphadenopathy or easy bruising Neurological: Denies numbness, tingling  or new weaknesses Behavioral/Psych: Mood is stable, no new changes  All other systems were reviewed with the patient and are negative.  PHYSICAL EXAMINATION: ECOG PERFORMANCE STATUS: 4 -  Bedbound  Vitals:   07/06/23 0917 07/06/23 1010  BP:  (!) 153/95  Pulse:  (!) 102  Resp:  16  Temp:  98.6 F (37 C)  SpO2: 95% 96%   Filed Weights   06/28/23 0929  Weight: 180 lb (81.6 kg)    GENERAL: alert, no distress and comfortable SKIN: skin color, texture, turgor are normal, no rashes or significant lesions EYES: normal, conjunctiva are pink and non-injected, sclera clear OROPHARYNX: no exudate, no erythema and lips, buccal mucosa, and tongue normal  NECK: supple, thyroid normal size, non-tender, without nodularity LYMPH: no palpable lymphadenopathy in the cervical, axillary or inguinal LUNGS: clear to auscultation and percussion with normal breathing effort HEART: regular rate & rhythm and no murmurs and no lower extremity edema ABDOMEN: abdomen soft, non-tender and normal bowel sounds MUSCULOSKELETAL: no cyanosis of digits and no clubbing  PSYCH: alert & oriented x 3 with fluent speech NEURO: no focal motor/sensory deficits   All questions were answered. The patient knows to call the clinic with any problems, questions or concerns.   The total time spent in the appointment was 40 minutes encounter with patient including review of chart and various tests results, discussions about plan of care and coordination of care plan  David Bills, NP 07/06/2023 10:31 AM    Labs Reviewed:  Lab Results  Component Value Date   WBC 10.5 07/06/2023   HGB 9.0 (L) 07/06/2023   HCT 28.7 (L) 07/06/2023   MCV 99.7 07/06/2023   PLT 20 (LL) 07/06/2023   Recent Labs    07/02/23 0330 07/03/23 0356 07/03/23 1244 07/04/23 0425 07/05/23 0348 07/06/23 0348  NA 126*   < >  --  131* 131* 130*  K 4.1   < >  --  4.1 4.0 3.8  CL 98   < >  --  98 98 98  CO2 21*   < >  --  22 23 22   GLUCOSE 118*   < >  --  128* 126* 95  BUN 14   < >  --  17 16 18   CREATININE 0.53*   < >  --  0.48* 0.43* 0.51*  CALCIUM 7.4*   < >  --  7.7* 7.4* 7.4*  GFRNONAA >60   < >  --  >60 >60 >60  PROT 4.9*    < >  --  5.0* 4.9* 5.0*  ALBUMIN 1.6*   < >  --  1.6* 1.5* 1.7*  AST 63*   < >  --  115* 113* 119*  ALT 21   < >  --  40 42 45*  ALKPHOS 465*   < >  --  534* 571* 648*  BILITOT 4.9*   < > 5.4* 4.8* 3.9* 3.4*  BILIDIR 2.9*  --  3.4*  --   --   --   IBILI  --   --  2.0*  --   --   --    < > = values in this interval not displayed.    Studies Reviewed:  US ABDOMEN LIMITED RUQ (LIVER/GB) Result Date: 07/03/2023 CLINICAL DATA:  Transaminitis EXAM: ULTRASOUND ABDOMEN LIMITED RIGHT UPPER QUADRANT COMPARISON:  CT 06/28/2023 and older FINDINGS: Gallbladder: Distended gallbladder with sludge and stones. Borderline wall thickening of 3 mm. No sonographic Murphy's  sign reported. Trace adjacent fluid suggested. Common bile duct: Diameter: 3 mm Liver: No focal lesion identified. Within normal limits in parenchymal echogenicity. Portal vein is patent on color Doppler imaging with normal direction of blood flow towards the liver. Other: Right pleural effusion. IMPRESSION: Distended gallbladder with sludge and stones. Borderline wall thickening. Question trace adjacent fluid. No ductal dilatation. There is further concern of acute cholecystitis a HIDA scan could be considered. Right pleural effusion. Electronically Signed   By: Karen Kays M.D.   On: 07/03/2023 16:02   CT ANGIO GI BLEED Result Date: 06/28/2023 CLINICAL DATA:  Lower GI bleeding. Mesenteric ischemia, acute. Metastatic colon cancer EXAM: CTA ABDOMEN AND PELVIS WITHOUT AND WITH CONTRAST TECHNIQUE: Multidetector CT imaging of the abdomen and pelvis was performed using the standard protocol during bolus administration of intravenous contrast. Multiplanar reconstructed images and MIPs were obtained and reviewed to evaluate the vascular anatomy. RADIATION DOSE REDUCTION: This exam was performed according to the departmental dose-optimization program which includes automated exposure control, adjustment of the mA and/or kV according to patient size and/or use  of iterative reconstruction technique. CONTRAST:  80mL OMNIPAQUE IOHEXOL 350 MG/ML SOLN COMPARISON:  06/04/2023 FINDINGS: VASCULAR Aorta: Normal caliber aorta without aneurysm, dissection, vasculitis or significant stenosis. Mild atherosclerosis. Celiac: Patent without evidence of aneurysm, dissection, vasculitis or significant stenosis. SMA: Patent without evidence of aneurysm, dissection, vasculitis or significant stenosis. Renals: The right renal artery is patent without evidence of aneurysm, dissection, vasculitis, fibromuscular dysplasia or significant stenosis. Patient has had a left nephrectomy. IMA: The origin and proximal aspect of the inferior mesenteric artery is not identified. This does appear to be reconstituted by branches of the superior mesenteric artery. Inflow: Patent without evidence of aneurysm, dissection, vasculitis or significant stenosis. Proximal Outflow: Bilateral common femoral and visualized portions of the superficial and profunda femoral arteries are patent without evidence of aneurysm, dissection, vasculitis or significant stenosis. Veins: Major venous structures are patent. Review of the MIP images confirms the above findings. NON-VASCULAR Lower chest: Small layering left pleural effusion. Hepatobiliary: 1.1 cm hypoattenuating lesion in the periphery of the right lower lobe, not definitively seen on the prior study (series 18, image 29. Previously described subtly hypoattenuating lesions within the liver at the gallbladder fossa and IVC are difficult to characterize but do not appear appreciably changed. Atrophy of the left hepatic lobe. Right hepatic lobe measures 22 cm in length. Small stone within the gallbladder. No biliary dilatation. Pancreas: Grossly unremarkable. Spleen: Grossly unchanged appearance of the spleen with multiple heterogeneously areas of hypoattenuation including dominant lesion measuring approximately 11.2 x 8.5 cm. 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 Adrenals/Urinary Tract: Thickened appearance of the adrenal glands. Prior left nephrectomy. Unremarkable right kidney. No stone or hydronephrosis. Unremarkable urinary bladder. Stomach/Bowel: Tiny hiatal hernia. Stomach otherwise within normal limits. No abnormally dilated loops of bowel. Postsurgical changes from prior sigmoid resection and reanastomosis abnormal colonic wall thickening in the region of the distal sigmoid colon (series 18, image 66). There is also focal colonic wall thickening just distal to the anastomotic site (series 18, image 57). Normal appendix in the right lower quadrant (series 18, image 53). No evidence of abnormal contrast accumulation within the bowel on multiphasic imaging to localize site of active gastrointestinal bleeding. Lymphatic: Mildly prominent left external iliac chain lymph node measuring 9 mm (series 18, image 59), unchanged. No new or enlarging abdominopelvic lymph nodes. Reproductive: Prostatomegaly. Other: No ascites or pneumoperitoneum. Musculoskeletal: Extensive, widespread osseous metastatic disease,  without evidence of significant progression compared to the prior study. Scoliotic spinal curvature. No pathologic fractures are seen. IMPRESSION: 1. The origin and proximal aspect of the inferior mesenteric artery is not identified. This does appear to be reconstituted by branches of the superior mesenteric artery. Otherwise, no significant vascular abnormality. 2. No evidence of abnormal contrast accumulation within the bowel to localize site of active gastrointestinal bleeding. 3. Postsurgical changes from prior sigmoid resection and reanastomosis. Abnormal colonic wall thickening in the region of the distal sigmoid colon and just distal to the anastomotic site. Findings may represent sites of focal colitis, however recurrent malignancy is not excluded. Follow-up colonoscopy should be considered following the resolution of patient's acute  symptoms. 4. New 1.1 cm hypoattenuating lesion in the periphery of the right hepatic lobe, not definitively seen on the prior study. This is concerning for a new site of metastatic disease. 5. Extensive, widespread osseous metastatic disease, without evidence of significant progression compared to the prior study. 6. Small layering left pleural effusion. 7. Cholelithiasis. 8. Aortic atherosclerosis. Electronically Signed   By: Duanne Guess D.O.   On: 06/28/2023 11:18   Mr. Helwig was interviewed and examined.  He continues to have diffuse bone pain.  He requests an adjustment in the pain regimen.  No bleeding.  He developed abdominal pain over the weekend and an ultrasound revealed a distended gallbladder with sludge and stones and borderline wall thickening.  The surgical service was consulted and feels he may have cholecystitis.  He has mild abdominal pain and right upper quadrant tenderness on exam today.  The chief pain continues to be at the right shoulder/arm and pelvis.  The bilirubin remains elevated, but has improved over the past few days.  The hemoglobin is stable with nucleated red cells noted.  The platelet count remains low.  I suspect the hyperbilirubinemia anemia and thrombocytopenia are at least in part related to bone marrow involvement with metastatic colon cancer.  He likely has a degree of intramedullary hemolysis.  It is possible the elevated bilirubin is also partially related to biliary disease.  The alkaline phosphatase has been chronically elevated and is likely due to metastatic disease involving the bones.  Mr. Cocuzza does not appear to be a candidate for inpatient rehabilitation.  He does not appear to be a candidate for further chemotherapy unless his performance status and thrombocytopenia improved.  Recommendations: Increase MS Contin to 100 mg twice daily, and Decadron Continue evaluation/management of possible cholecystitis per the surgical service Continue physical  therapy I will continue discussions with Mr. Rubin and his wife regarding the indication for hospice care.

## 2023-07-06 NOTE — Progress Notes (Addendum)
 Occupational Therapy Treatment Patient Details Name: [name] MRN: [id number] DOB: 1965/05/02 Today's Date: 07/06/2023   History of present illness 58 yo male admitted with LGIB, anemia, weakness. Hx of met colon Ca, DM, DVT, discitis, endocarditis, chronc hyponatremia   OT comments  Patient seen for skilled OT session this pm. Patient had friend bedside for encouragement during therapy and patient open to all activity presented despite pain and activity tolerance deficits for bed and EOB simple self care, mobility and sitting balance retraining. Patient requested activity/therex he could perform between visits- OT instructed on ankle pumps, foam grasp trainer and encouraged flutter breathing device all performed as stated below. Nursing for wound care after OT session thus rested back in bed at end of session. BORG Scale of RPE education provided with pt rating EOB activity 18-20/20 RPE this day. Patient motivated this session despite significant pain and effort for all tasks. Patient will benefit from ongoing Acute care hospital OT to progress functional level and increase potential for intensive inpatient follow-up therapy, >3 hours/day.        If plan is discharge home, recommend the following:  A lot of help with bathing/dressing/bathroom;Assist for transportation;Assistance with cooking/housework;Help with stairs or ramp for entrance;A lot of help with walking and/or transfers   Equipment Recommendations  None recommended by OT       Precautions / Restrictions Precautions Precautions: Fall Restrictions Weight Bearing Restrictions Per Provider Order: No       Mobility Bed Mobility Overal bed mobility: Needs Assistance Bed Mobility: Sidelying to Sit, Supine to Sit, Sit to Supine Rolling: Mod assist Sidelying to sit: Mod assist, +2 for physical assistance, +2 for safety/equipment, HOB elevated, Used rails Supine to sit: Used rails, HOB elevated, Mod assist Sit to supine:  Mod assist Sit to sidelying: Mod assist, Used rails, HOB elevated General bed mobility comments: Increased time. Cues provided. Assist for trunk and B LEs. Educated on log roll for pain management    Transfers Overall transfer level:  (OT encouraged OOB to chair but nursing completing wound care post visit)                       Balance Overall balance assessment: Needs assistance Sitting-balance support: Single extremity supported, Feet supported Sitting balance-Leahy Scale: Fair                                     ADL either performed or assessed with clinical judgement   ADL Overall ADL's : Needs assistance/impaired     Grooming: Wash/dry face;Oral care;Sitting;Bed level;Set up Grooming Details (indicate cue type and reason): fpr EOB activity unilateral use of rail for stability and offloading pain to spine and hips                               General ADL Comments: educated on breathing and BORG Scale for RPE qualification with EOB sitting for oral care 18/20 RPE and bed level face washing 6 RPE    Extremity/Trunk Assessment Upper Extremity Assessment Upper Extremity Assessment: Generalized weakness;Right hand dominant   Lower Extremity Assessment Lower Extremity Assessment: Generalized weakness                 Communication Communication Communication: No apparent difficulties   Cognition Arousal: Alert Behavior During Therapy: WFL for tasks assessed/performed, Anxious  OT - Cognition Comments: mild delayed processing when mobilized and in pain                 Following commands: Intact        Cueing   Cueing Techniques: Verbal cues  Exercises Total Joint Exercises Ankle Circles/Pumps: 10 reps    Shoulder Instructions       General Comments worked with pt in sitting on flutter valve, foam grasp ball squeezes, pinch, isometric chest presses 10 reps x 3 sets    Pertinent Vitals/ Pain        Pain Assessment Pain Assessment: 0-10 Pain Score: 7  Pain Location: spine, all 4 extremities Pain Descriptors / Indicators: Grimacing, Guarding, Sharp, Aching, Stabbing, Tightness, Shooting Pain Intervention(s): Limited activity within patient's tolerance, Monitored during session, Premedicated before session, Repositioned, Relaxation, Utilized relaxation techniques   Frequency  Min 2X/week        Progress Toward Goals  OT Goals(current goals can now be found in the care plan section)  Progress towards OT goals: Progressing toward goals  Acute Rehab OT Goals Patient Stated Goal: to keep progressing OT Goal Formulation: With patient Time For Goal Achievement: 07/17/23 Potential to Achieve Goals: Good ADL Goals Pt Will Perform Grooming: with contact guard assist;standing Pt Will Perform Lower Body Dressing: with contact guard assist;with adaptive equipment;sitting/lateral leans;sit to/from stand Pt Will Transfer to Toilet: with contact guard assist;ambulating;grab bars Pt Will Perform Toileting - Clothing Manipulation and hygiene: with contact guard assist;sit to/from stand  Plan         AM-PAC OT "6 Clicks" Daily Activity     Outcome Measure   Help from another person eating meals?: None Help from another person taking care of personal grooming?: A Little Help from another person toileting, which includes using toliet, bedpan, or urinal?: A Lot Help from another person bathing (including washing, rinsing, drying)?: A Lot Help from another person to put on and taking off regular upper body clothing?: A Little Help from another person to put on and taking off regular lower body clothing?: A Lot 6 Click Score: 16    End of Session Equipment Utilized During Treatment: Gait belt  OT Visit Diagnosis: Unsteadiness on feet (R26.81);Muscle weakness (generalized) (M62.81);Pain;Other abnormalities of gait and mobility (R26.89)   Activity Tolerance Patient limited by  fatigue;Patient limited by pain   Patient Left in bed;with call bell/phone within reach;with bed alarm set;with family/visitor present   Nurse Communication Mobility status;Patient requests pain meds        Time: 1610-9604 OT Time Calculation (min): 32 min  Charges: OT General Charges $OT Visit: 1 Visit OT Treatments $Self Care/Home Management : 8-22 mins $Therapeutic Activity: 8-22 mins  Gerardo Caiazzo OT/L Acute Rehabilitation Department  [phone]  07/06/2023, 4:32 PM

## 2023-07-06 NOTE — Progress Notes (Signed)
 PROGRESS NOTE [name]  ZOX:[id number] DOB: [birth year] DOA: 06/28/2023 PCP: Joycelyn Rua, MD  Brief Narrative/Hospital Course: 58 year old with history of colon cancer on FOLFOX, Avastin follows Dr [name], insulin-dependent DM2, HTN, HLD, DVT on Pradaxa admitted to the hospital for bright red blood per rectum/acute blood loss anemia.  Recently treated for gluteal fold cellulitis.  Last dose of Pradaxa was the night before his hospitalization.  Admission hemoglobin was noted to be 6.5 therefore PRBC transfusion was ordered.  CT angio was negative for active bleeding but showed colonic wall thickening distal to prior anastomosis.  Colonoscopy prep was overall inadequate stool was noted in sigmoid colon.  There was oozing distal descending colon blood vessel which was endoscopically treated.  EGD showed grade B esophagitis and congested duodenal mucosa but no other signs of bleeding.  For now GI recommended holding off on anticoagulation for at least 1 week. Overall remains hemodynamically stable. Due to complaints of decreased hearing on left ear discussed with oncology question if he has metastatic spots at base of skull-advised hospice based upon his overall functional status and metastatic disease AND patient does not feel ready yet.  Consultation: Gastroenterology [facility]  Procedures/testing: E4/1 : EGD/ COLONOSCOPY (Left)-CONTROL OF HEMORRHAGE, GI TRACT, ENDOSCOPIC, BY CLIPPING OR OVERSEWING SCLEROTHERAPY   Subjective: Seen and examined this morning Alert awake Overnight mild tachycardia BP remains stable.  Patient having increased pain 10/10 in the shoulder hip and back more than his abdomen. Labs showed blood sugar 103 hyponatremia 130 stable renal function mag 1.6 Labs reviewedALT normal AST 115>113 >119, TB slightly down 3.4. CBC with hemoglobin of 8.6 > 8g> 8.4> 8.6>9g, plt down 54>49>39>30>25>21k>20k. Last BM 4/5  Assessment & Plan:  BRBPR-colorectal ABLA Acute  on chronic anemia Acute on chronic thrombocytopenia: B/L hB ~8.0, due to rapid decline in his hemoglobin and multiple brbpr received total 3 units of PRBC-and HB stable since S/P EGD colonoscopy 4/1- prep was overall inadequate,oozing distal descending colon blood vessel which was endoscopically treated.  EGD showed grade B esophagitis and congested duodenal mucosa but no other signs of bleeding. GI recommended to hold off anticoagulation at least 1 week and gi signed off Cont PPI BID, cont to hold aspirin. Recent Labs  Lab 07/02/23 1211 07/03/23 0356 07/04/23 0425 07/05/23 0348 07/06/23 0348  HGB 8.5* 8.0* 8.4* 8.6* 9.0*  HCT 27.3* 25.9* 27.0* 28.3* 28.7*   Colon cancer, metastatic Anemia and thrombocytopenia, bone marrow suppression from chemo and metastasis Severe cancer-related pain Lower extremity weakness/debility from cancer: Patient has pain due to extensive bone metastasis and has deconditioning and weakness. He is on MS Contin twice daily,  prn p.o./IV narcotics He was on  FOLFOX and Avastin as outpatient  and is followed by [name] and appreciate inputs Again extensively discussed very difficult situation; patient has severe pain in the setting of his cancer with mets- suspected cholecystitis or elevation of TB due to hemolysis and bone marrow involvement.  HIDA scan ordered but unable to be done due to patient being on narcotics and not able to come off. Dr Truett Perna extensively discussed about hospice at this point of time but patient is not electing to enter hospice. Continue to engage in GOC.  Oncology will follow-up. He was to go home and not SNF.  Transaminitis Distended gallbladder with sludge and stones Possible ?acute cholecystitis: Labs with transaminitis, initially concerned about hemolysis with bone marrow involvement of metastatic disease, s/p IA RUQ that showed distended gallbladder with sludge and stone borderline wall  thickening-surgery was consulted  placed on IV antibiotics and HIDA scan ordered to rule out acute cholecystitis-patient is needing continuous narcotics along with scheduled MS Contin, HIDA scan will likely be false-extensively discussed with patient's oncologist, surgery-and also with the patient given difficult situation> plan to manage with IV antibiotics pain control given his pain is severe, HIDA scan discontinued per [facility]. Per [facility] he is moderately poor surgical candidate for cholecystectomy, is profoundly thrombocytopenic-and also not a ideal candidate for cholecystostomy drain if HIDA were positive. Continue to trend LFTs Recent Labs  Lab 07/02/23 0330 07/02/23 1211 07/03/23 0356 07/03/23 1244 07/04/23 0425 07/05/23 0348 07/06/23 0348  AST 63*  --  105*  --  115* 113* 119*  ALT 21  --  32  --  40 42 45*  ALKPHOS 465*  --  484*  --  534* 571* 648*  BILITOT 4.9*  --  5.7* 5.4* 4.8* 3.9* 3.4*  BILIDIR 2.9*  --   --  3.4*  --   --   --   IBILI  --   --   --  2.0*  --   --   --   PROT 4.9*  --  4.8*  --  5.0* 4.9* 5.0*  ALBUMIN 1.6*  --  1.5*  --  1.6* 1.5* 1.7*  INR  --  1.3*  --   --   --   --   --   PLT 39*  --  30*  --  25* 21* 20*   Diminished hearing sensation of the left/fuzziness/ringing since the left ear: Patient was placed in eardrops reports hearing is improving, there is question about if he has metastatic disease involving base of skull MRI brain with and without contrast on 05/25/23 has 2 punctate foci of enhancement on the left parietal occipital region could be metastasis, no acute finding.   Discussed with oncology continue symptomatic management-[name] discussed with the patient as he is a candidate for hospice, but patient and wife reluctant for hospice for now.Patient reports he wants to try medical management as well as pain management   "SOME OF BOTH WORLD"  IDDMtype 2 with uncontrolled hyperglycemia: At home on insulin glargine 41 u daily, metformin 1000 twice daily and on hold.  With PAD  status  NPO-insulin dose decreased from 20 to 10 units and SSI. Recent Labs  Lab 07/05/23 1140 07/05/23 1702 07/05/23 2019 07/06/23 0722 07/06/23 1140  GLUCAP 233* 174* 151* 103* 110*    Hypocalcemia 7.4-but albumin low at 1.5-corrected calcium in 9  Hypomagnesemia: Replaced IV.  Hyponatremia: Sodium trending up.  Monitor Recent Labs  Lab 07/02/23 0330 07/03/23 0356 07/04/23 0425 07/05/23 0348 07/06/23 0348  NA 126* 128* 131* 131* 130*    Hyperlipidemia Hold Lipitor  Essential hypertension BP stable on Toprol-XL 25 mg- cont w/ holding parameters. Lisinopril on hold  History of DVT Pradaxa remains on hold due to GI bleeding- to hold x 1 week .Resume as OP or when ok w/ oncology.  Goals of care/CODE STATUS: Extensively discussed goals of care and encourage DNR/DNI after discussing his case with [name] Per oncology remains full code for now. Dr Truett Perna extensively discussed about hospice at this point of time but patient is not electing to enter hospice.Continue to engage in GOC Patient having ongoing GI issues at this time concern for acute cholecystitis, appears to be not a ideal candidate for cholecystectomy, pending further workup See discussion above .  patient remains high risk for decompensation,  and readmission  DVT prophylaxis: SCDs Start: 06/28/23 1229 Code Status:   Code Status: Full Code Family Communication: plan of care discussed with patient Patient status is: Remains hospitalized because of severity of illness Level of care: Med-Surg   Dispo: The patient is from: Home            Anticipated disposition: TBD  Objective: Vitals last 24 hrs: Vitals:   07/06/23 0915 07/06/23 0917 07/06/23 1010 07/06/23 1147  BP:   (!) 153/95 (!) 153/95  Pulse:   (!) 102 (!) 102  Resp:   16   Temp:   98.6 F (37 C)   TempSrc:   Oral   SpO2: 95% 95% 96%   Weight:      Height:       Weight change:   Physical Examination: General exam: alert awake,  ill-appearing,older than stated age HEENT:Oral mucosa moist, Ear/Nose WNL grossly Respiratory system: Bilaterally clear BS,no use of accessory muscle Cardiovascular system: S1 & S2 +, No JVD. Gastrointestinal system: Abdomen soft, diffuse tenderness,ND, BS+ Nervous System: Alert, awake, moving all extremities,and following commands. Extremities: LE edema neg,distal peripheral pulses palpable and warm.  Skin: No rashes,no icterus. MSK: Normal muscle bulk,tone, power   Medications reviewed:  Scheduled Meds:  sodium chloride   Intravenous Once   budesonide  0.25 mg Nebulization BID   carbamide peroxide  5 drop Left EAR BID   Chlorhexidine Gluconate Cloth  6 each Topical Daily   ciprofloxacin-hydrocortisone  3 drop Left EAR BID   dexamethasone  4 mg Oral Daily   insulin aspart  0-15 Units Subcutaneous TID WC & HS   insulin glargine-yfgn  10 Units Subcutaneous Daily   loratadine  10 mg Oral Daily   metoprolol succinate  25 mg Oral Daily   morphine  100 mg Oral Q12H   pantoprazole (PROTONIX) IV  40 mg Intravenous Q12H   senna-docusate  1 tablet Oral BID  Continuous Infusions:  cefTRIAXone (ROCEPHIN)  IV 2 g (07/05/23 1611)   metronidazole 500 mg (07/06/23 1159)     Diet Order             Diet clear liquid Fluid consistency: Thin  Diet effective now                  Intake/Output Summary (Last 24 hours) at 07/06/2023 1254 Last data filed at 07/06/2023 0300 Gross per 24 hour  Intake 450 ml  Output 250 ml  Net 200 ml   Net IO Since Admission: 7,168.77 mL [07/06/23 1254]  Wt Readings from Last 3 Encounters:  06/28/23 81.6 kg  06/17/23 81.6 kg  06/10/23 85 kg     Unresulted Labs (From admission, onward)     Start     Ordered   07/05/23 0500  Comprehensive metabolic panel with GFR  Daily,   R     Question:  Specimen collection method  Answer:  IV Team=IV Team collect   07/04/23 1028          Data Reviewed: I have personally reviewed following labs and imaging studies  ( see epic result tab) CBC: Recent Labs  Lab 07/02/23 0330 07/02/23 1211 07/03/23 0356 07/04/23 0425 07/05/23 0348 07/06/23 0348  WBC 5.8  --  6.2 8.0 9.2 10.5  HGB 8.6* 8.5* 8.0* 8.4* 8.6* 9.0*  HCT 27.8* 27.3* 25.9* 27.0* 28.3* 28.7*  MCV 99.6  --  100.0 99.6 100.0 99.7  PLT 39*  --  30* 25* 21* 20*   CMP:  Recent Labs  Lab 06/30/23 0455 07/01/23 0450 07/02/23 0330 07/03/23 0356 07/04/23 0425 07/05/23 0348 07/06/23 0348  NA 132*   < > 126* 128* 131* 131* 130*  K 3.9   < > 4.1 4.1 4.1 4.0 3.8  CL 105   < > 98 97* 98 98 98  CO2 18*   < > 21* 23 22 23 22   GLUCOSE 152*   < > 118* 214* 128* 126* 95  BUN 17   < > 14 17 17 16 18   CREATININE 0.60*   < > 0.53* 0.41* 0.48* 0.43* 0.51*  CALCIUM 7.3*   < > 7.4* 7.5* 7.7* 7.4* 7.4*  MG 1.7   < > 1.8 1.8 1.7 1.6* 1.6*  PHOS 3.0  --   --   --   --   --   --    < > = values in this interval not displayed.   GFR: Estimated Creatinine Clearance: 109.8 mL/min (A) (by C-G formula based on SCr of 0.51 mg/dL (L)). Recent Labs  Lab 07/02/23 0330 07/03/23 0356 07/03/23 1244 07/04/23 0425 07/05/23 0348 07/06/23 0348  AST 63* 105*  --  115* 113* 119*  ALT 21 32  --  40 42 45*  ALKPHOS 465* 484*  --  534* 571* 648*  BILITOT 4.9* 5.7* 5.4* 4.8* 3.9* 3.4*  PROT 4.9* 4.8*  --  5.0* 4.9* 5.0*  ALBUMIN 1.6* 1.5*  --  1.6* 1.5* 1.7*   Recent Labs  Lab 07/05/23 1140 07/05/23 1702 07/05/23 2019 07/06/23 0722 07/06/23 1140  GLUCAP 233* 174* 151* 103* 110*    Antimicrobials/Microbiology: Anti-infectives (From admission, onward)    Start     Dose/Rate Route Frequency Ordered Stop   07/04/23 1100  cefTRIAXone (ROCEPHIN) 2 g in sodium chloride 0.9 % 100 mL IVPB        2 g 200 mL/hr over 30 Minutes Intravenous Every 24 hours 07/04/23 1027 07/09/23 1059   07/04/23 1100  metroNIDAZOLE (FLAGYL) IVPB 500 mg        500 mg 100 mL/hr over 60 Minutes Intravenous Every 12 hours 07/04/23 1027           Component Value Date/Time   SDES  ABSCESS 08/13/2021 1939   SPECREQUEST RIGHT GROIN ABSCESS 08/13/2021 1939   CULT  08/13/2021 1939    FEW ARCANOBACTERIUM HAEMOLYTICUM Standardized susceptibility testing for this organism is not available. MIXED ANAEROBIC FLORA PRESENT.  CALL LAB IF FURTHER IID REQUIRED.    REPTSTATUS 08/18/2021 FINAL 08/13/2021 1939     Radiology Studies: No results found.    LOS: 8 days   Total time spent in review of labs and imaging, patient evaluation, formulation of plan, documentation and communication with patient/family: 50 minutes  Lanae Boast, MD [facility] 07/06/2023, 12:54 PM

## 2023-07-07 DIAGNOSIS — K922 Gastrointestinal hemorrhage, unspecified: Secondary | ICD-10-CM | POA: Diagnosis not present

## 2023-07-07 LAB — CBC WITH DIFFERENTIAL/PLATELET
Abs Immature Granulocytes: 2.19 10*3/uL — ABNORMAL HIGH (ref 0.00–0.07)
Basophils Absolute: 0.1 10*3/uL (ref 0.0–0.1)
Basophils Relative: 1 %
Eosinophils Absolute: 0.1 10*3/uL (ref 0.0–0.5)
Eosinophils Relative: 1 %
HCT: 29.3 % — ABNORMAL LOW (ref 39.0–52.0)
Hemoglobin: 8.9 g/dL — ABNORMAL LOW (ref 13.0–17.0)
Immature Granulocytes: 17 %
Lymphocytes Relative: 6 %
Lymphs Abs: 0.7 10*3/uL (ref 0.7–4.0)
MCH: 31.2 pg (ref 26.0–34.0)
MCHC: 30.4 g/dL (ref 30.0–36.0)
MCV: 102.8 fL — ABNORMAL HIGH (ref 80.0–100.0)
Monocytes Absolute: 0.9 10*3/uL (ref 0.1–1.0)
Monocytes Relative: 7 %
Neutro Abs: 9.2 10*3/uL — ABNORMAL HIGH (ref 1.7–7.7)
Neutrophils Relative %: 68 %
Platelets: 20 10*3/uL — CL (ref 150–400)
RBC: 2.85 MIL/uL — ABNORMAL LOW (ref 4.22–5.81)
RDW: 20.5 % — ABNORMAL HIGH (ref 11.5–15.5)
Smear Review: DECREASED
WBC: 13.2 10*3/uL — ABNORMAL HIGH (ref 4.0–10.5)
nRBC: 6.3 % — ABNORMAL HIGH (ref 0.0–0.2)

## 2023-07-07 LAB — COMPREHENSIVE METABOLIC PANEL WITH GFR
ALT: 42 U/L (ref 0–44)
AST: 102 U/L — ABNORMAL HIGH (ref 15–41)
Albumin: 1.6 g/dL — ABNORMAL LOW (ref 3.5–5.0)
Alkaline Phosphatase: 610 U/L — ABNORMAL HIGH (ref 38–126)
Anion gap: 8 (ref 5–15)
BUN: 21 mg/dL — ABNORMAL HIGH (ref 6–20)
CO2: 23 mmol/L (ref 22–32)
Calcium: 7.2 mg/dL — ABNORMAL LOW (ref 8.9–10.3)
Chloride: 99 mmol/L (ref 98–111)
Creatinine, Ser: 0.7 mg/dL (ref 0.61–1.24)
GFR, Estimated: >60 mL/min (ref >60)
Glucose, Bld: 197 mg/dL — ABNORMAL HIGH (ref 70–99)
Potassium: 4 mmol/L (ref 3.5–5.1)
Sodium: 130 mmol/L — ABNORMAL LOW (ref 135–145)
Total Bilirubin: 3.2 mg/dL — ABNORMAL HIGH (ref 0.0–1.2)
Total Protein: 5 g/dL — ABNORMAL LOW (ref 6.5–8.1)

## 2023-07-07 LAB — GLUCOSE, CAPILLARY
Glucose-Capillary: 206 mg/dL — ABNORMAL HIGH (ref 70–99)
Glucose-Capillary: 300 mg/dL — ABNORMAL HIGH (ref 70–99)
Glucose-Capillary: 199 mg/dL — ABNORMAL HIGH (ref 70–99)
Glucose-Capillary: 297 mg/dL — ABNORMAL HIGH (ref 70–99)
Glucose-Capillary: 312 mg/dL — ABNORMAL HIGH (ref 70–99)

## 2023-07-07 MED ORDER — POLYETHYLENE GLYCOL 3350 17 G PO PACK
17.0000 g | PACK | Freq: Every day | ORAL | Status: DC | PRN
Start: 2023-07-07 — End: 2023-07-11

## 2023-07-07 NOTE — Progress Notes (Signed)
  Inpatient Rehabilitation Admissions Coordinator   I spoke with his wife by phone. She is aware of all discussions concerning his overall medical plans. She and her husband wish to continue to be aggressive in his care. I will discuss case with Rehab MD in our am huddle and follow up with acute team to clarify his candidacy for possible Cir. If felt to be a candidate, I would then begin Auth with his [employer].  Ottie Glazier, RN, MSN Rehab Admissions Coordinator [phone] 07/07/2023 6:11 PM

## 2023-07-07 NOTE — Progress Notes (Signed)
 PROGRESS NOTE [name]  ZOX:[id number] DOB: [birth year] DOA: 06/28/2023 PCP: Joycelyn Rua, MD  Brief Narrative/Hospital Course: 58 year old with history of colon cancer on FOLFOX, Avastin follows Dr [name], insulin-dependent DM2, HTN, HLD, DVT on Pradaxa admitted to the hospital for bright red blood per rectum/acute blood loss anemia.  Recently treated for gluteal fold cellulitis.  Last dose of Pradaxa was the night before his hospitalization.  Admission hemoglobin was noted to be 6.5 therefore PRBC transfusion was ordered.  CT angio was negative for active bleeding but showed colonic wall thickening distal to prior anastomosis.  Colonoscopy prep was overall inadequate stool was noted in sigmoid colon.  There was oozing distal descending colon blood vessel which was endoscopically treated.  EGD showed grade B esophagitis and congested duodenal mucosa but no other signs of bleeding.  For now GI recommended holding off on anticoagulation for at least 1 week. Overall remains hemodynamically stable. Due to complaints of decreased hearing on left ear discussed with oncology question if he has metastatic spots at base of skull-advised hospice based upon his overall functional status and metastatic disease AND patient does not feel ready yet.  Consultation: Gastroenterology [facility]  Procedures/testing: E4/1 : EGD/ COLONOSCOPY (Left)-CONTROL OF HEMORRHAGE, GI TRACT, ENDOSCOPIC, BY CLIPPING OR OVERSEWING SCLEROTHERAPY   Subjective: Seen and examined After adjusting meds yesterday he feels much better has minimal abdominal pain Able to get up at the bedside chair now Overnight afebrile BP stable Labs reviewed this morning mild hyponatremia alk phos in 610 AST slightly better 190 QV further down 3.2 Last BM 4/5  Assessment & Plan:  BRBPR-colorectal ABLA Acute on chronic anemia Acute on chronic thrombocytopenia: B/L hB ~8.0, due to rapid decline in his hemoglobin and multiple brbpr  received total 3 units of PRBC-and HB stable since S/P EGD colonoscopy 4/1- prep was overall inadequate,oozing distal descending colon blood vessel which was endoscopically treated.  EGD showed grade B esophagitis and congested duodenal mucosa but no other signs of bleeding.  GI recommended to hold off anticoagulation at least 1 week and gi signed off Cont PPI BID, cont to hold aspirin. Recent Labs  Lab 07/02/23 1211 07/03/23 0356 07/04/23 0425 07/05/23 0348 07/06/23 0348  HGB 8.5* 8.0* 8.4* 8.6* 9.0*  HCT 27.3* 25.9* 27.0* 28.3* 28.7*   Colon cancer, metastatic Anemia and thrombocytopenia, bone marrow suppression from chemo and metastasis Severe cancer-related pain Lower extremity weakness/debility from cancer: Patient has pain due to extensive bone metastasis and has deconditioning and weakness. He is on MS Contin twice daily,  prn p.o./IV narcotics He was on  FOLFOX and Avastin as outpatient  [name] following closely.   Again extensively discussed very difficult situation; patient has severe pain in the setting of his cancer with mets- suspected cholecystitis or elevation of TB due to hemolysis and bone marrow involvement.  HIDA scan ordered but unable to be done due to patient being on narcotics and not able to come off-discussed extensively with patient along with GI and oncology -at this time plan is for antibiotics, no more testing, pain actually much better with current pain regimen.   Continue to engage in GOC.  Transaminitis Distended gallbladder with sludge and stones Possible ?acute cholecystitis: Labs with transaminitis, initially concerned about hemolysis with bone marrow involvement of metastatic disease, s/p IA RUQ that showed distended gallbladder with sludge and stone borderline wall thickening-surgery was consulted placed on IV antibiotics and HIDA scan ordered to rule out acute cholecystitis-patient is needing continuous narcotics along with  scheduled MS Contin,  HIDA scan will likely be false-extensively discussed with patient's oncologist, surgery-and also with the patient given difficult situation> plan to manage with antibiotics pain control given his pain is severe, HIDA scan discontinued per [facility]. Per [facility] he is moderately poor surgical candidate for cholecystectomy, is profoundly thrombocytopenic-and also not a ideal candidate for cholecystostomy drain if HIDA were positive.  Continue to trend LFTs. Pain much improved today. Recent Labs  Lab 07/02/23 0330 07/02/23 1211 07/03/23 0356 07/03/23 1244 07/04/23 0425 07/05/23 0348 07/06/23 0348 07/07/23 0349  AST 63*  --  105*  --  115* 113* 119* 102*  ALT 21  --  32  --  40 42 45* 42  ALKPHOS 465*  --  484*  --  534* 571* 648* 610*  BILITOT 4.9*  --  5.7* 5.4* 4.8* 3.9* 3.4* 3.2*  BILIDIR 2.9*  --   --  3.4*  --   --   --   --   IBILI  --   --   --  2.0*  --   --   --   --   PROT 4.9*  --  4.8*  --  5.0* 4.9* 5.0* 5.0*  ALBUMIN 1.6*  --  1.5*  --  1.6* 1.5* 1.7* 1.6*  INR  --  1.3*  --   --   --   --   --   --   PLT 39*  --  30*  --  25* 21* 20*  --    Diminished hearing sensation of the left/fuzziness/ringing since the left ear: Patient was placed in eardrops reports hearing is improving, there is question about if he has metastatic disease involving base of skull MRI brain with and without contrast on 05/25/23 has 2 punctate foci of enhancement on the left parietal occipital region could be metastasis, no acute finding.  Discussed with oncology continue symptomatic management-[name] discussed with the patient as he is a candidate for hospice, but patient and wife reluctant for hospice for now.Patient reports he wants to try medical management as well as pain management   "SOME OF BOTH WORLD"  IDDMtype 2 with uncontrolled hyperglycemia: At home on insulin glargine 41 u daily, metformin 1000 twice daily and on hold.  Resume insulin at 20 units, continue SSI Recent Labs  Lab 07/06/23 0722  07/06/23 1140 07/06/23 1632 07/06/23 2019 07/07/23 0732  GLUCAP 103* 110* 223* 217* 206*    Hypocalcemia Corrected calcium is stable due to hypoalbuminemia  Hypomagnesemia: Replaced   Hyponatremia: Sodium trending up.  Monitor Recent Labs  Lab 07/03/23 0356 07/04/23 0425 07/05/23 0348 07/06/23 0348 07/07/23 0349  NA 128* 131* 131* 130* 130*    Hyperlipidemia Hold Lipitor  Essential hypertension BP stable on Toprol-XL 25 mg- cont w/ holding parameters. Lisinopril on hold  History of DVT Pradaxa remains on hold due to GI bleeding- to hold x 1 week .Resume as OP or when ok w/ oncology.  Goals of care/CODE STATUS: Extensively discussed goals of care and encourage DNR/DNI after discussing his case with [name] Per oncology remains full code for now. Dr Truett Perna extensively discussed about hospice at this point of time but patient is not electing to enter hospice.Continue to engage in GOC Patient having ongoing GI issues at this time concern for acute cholecystitis, appears to be not a ideal candidate for cholecystectomy, pending further workup See discussion above .  patient remains high risk for decompensation, and readmission Plan is for discharge to  [facility] once bed available in 1-2 days  DVT prophylaxis: SCDs Start: 06/28/23 1229 Code Status:   Code Status: Full Code Family Communication: plan of care discussed with patient Patient status is: Remains hospitalized because of severity of illness Level of care: Med-Surg   Dispo: The patient is from: Home            Anticipated disposition: CIR 1-2 days  Objective: Vitals last 24 hrs: Vitals:   07/06/23 2056 07/07/23 0532 07/07/23 0911 07/07/23 0959  BP:  (!) 158/102 (!) 158/102   Pulse:  (!) 105 (!) 105   Resp:  18    Temp:  98 F (36.7 C)    TempSrc:      SpO2: 97% 96%  98%  Weight:      Height:       Weight change:   Physical Examination: General exam: alert awake,  HEENT:Oral mucosa moist, Ear/Nose  WNL grossly Respiratory system: Bilaterally clear BS,no use of accessory muscle Cardiovascular system: S1 & S2 +, No JVD. Gastrointestinal system: Abdomen soft, mild diffuse tenderness,ND, BS+ Nervous System: Alert, awake, moving all extremities,and following commands. Extremities: LE edema neg,distal peripheral pulses palpable and warm.  Skin: No rashes,no icterus. MSK: Normal muscle bulk,tone, power   Medications reviewed:  Scheduled Meds:  sodium chloride   Intravenous Once   budesonide  0.25 mg Nebulization BID   carbamide peroxide  5 drop Left EAR BID   Chlorhexidine Gluconate Cloth  6 each Topical Daily   ciprofloxacin-hydrocortisone  3 drop Left EAR BID   dexamethasone  4 mg Oral Daily   insulin aspart  0-15 Units Subcutaneous TID WC & HS   insulin glargine-yfgn  10 Units Subcutaneous Daily   loratadine  10 mg Oral Daily   metoprolol succinate  25 mg Oral Daily   morphine  100 mg Oral Q12H   pantoprazole (PROTONIX) IV  40 mg Intravenous Q12H   senna-docusate  1 tablet Oral BID  Continuous Infusions:  cefTRIAXone (ROCEPHIN)  IV 2 g (07/06/23 1332)   metronidazole 500 mg (07/07/23 0936)     Diet Order             DIET DYS 3 Room service appropriate? Yes; Fluid consistency: Thin  Diet effective now                  Intake/Output Summary (Last 24 hours) at 07/07/2023 1125 Last data filed at 07/07/2023 0639 Gross per 24 hour  Intake 440 ml  Output 900 ml  Net -460 ml   Net IO Since Admission: 6,708.77 mL [07/07/23 1125]  Wt Readings from Last 3 Encounters:  06/28/23 81.6 kg  06/17/23 81.6 kg  06/10/23 85 kg     Unresulted Labs (From admission, onward)     Start     Ordered   07/07/23 1006  CBC with Differential/Platelet  Daily,   R     Question:  Specimen collection method  Answer:  IV Team=IV Team collect   07/07/23 1005   07/05/23 0500  Comprehensive metabolic panel with GFR  Daily,   R     Question:  Specimen collection method  Answer:  IV Team=IV Team  collect   07/04/23 1028          Data Reviewed: I have personally reviewed following labs and imaging studies ( see epic result tab) CBC: Recent Labs  Lab 07/02/23 0330 07/02/23 1211 07/03/23 0356 07/04/23 0425 07/05/23 0348 07/06/23 0348  WBC 5.8  --  6.2  8.0 9.2 10.5  HGB 8.6* 8.5* 8.0* 8.4* 8.6* 9.0*  HCT 27.8* 27.3* 25.9* 27.0* 28.3* 28.7*  MCV 99.6  --  100.0 99.6 100.0 99.7  PLT 39*  --  30* 25* 21* 20*   CMP: Recent Labs  Lab 07/02/23 0330 07/03/23 0356 07/04/23 0425 07/05/23 0348 07/06/23 0348 07/07/23 0349  NA 126* 128* 131* 131* 130* 130*  K 4.1 4.1 4.1 4.0 3.8 4.0  CL 98 97* 98 98 98 99  CO2 21* 23 22 23 22 23   GLUCOSE 118* 214* 128* 126* 95 197*  BUN 14 17 17 16 18  21*  CREATININE 0.53* 0.41* 0.48* 0.43* 0.51* 0.70  CALCIUM 7.4* 7.5* 7.7* 7.4* 7.4* 7.2*  MG 1.8 1.8 1.7 1.6* 1.6*  --    GFR: Estimated Creatinine Clearance: 109.8 mL/min (by C-G formula based on SCr of 0.7 mg/dL). Recent Labs  Lab 07/03/23 0356 07/03/23 1244 07/04/23 0425 07/05/23 0348 07/06/23 0348 07/07/23 0349  AST 105*  --  115* 113* 119* 102*  ALT 32  --  40 42 45* 42  ALKPHOS 484*  --  534* 571* 648* 610*  BILITOT 5.7* 5.4* 4.8* 3.9* 3.4* 3.2*  PROT 4.8*  --  5.0* 4.9* 5.0* 5.0*  ALBUMIN 1.5*  --  1.6* 1.5* 1.7* 1.6*   Recent Labs  Lab 07/06/23 0722 07/06/23 1140 07/06/23 1632 07/06/23 2019 07/07/23 0732  GLUCAP 103* 110* 223* 217* 206*    Antimicrobials/Microbiology: Anti-infectives (From admission, onward)    Start     Dose/Rate Route Frequency Ordered Stop   07/04/23 1100  cefTRIAXone (ROCEPHIN) 2 g in sodium chloride 0.9 % 100 mL IVPB        2 g 200 mL/hr over 30 Minutes Intravenous Every 24 hours 07/04/23 1027 07/09/23 1059   07/04/23 1100  metroNIDAZOLE (FLAGYL) IVPB 500 mg        500 mg 100 mL/hr over 60 Minutes Intravenous Every 12 hours 07/04/23 1027           Component Value Date/Time   SDES ABSCESS 08/13/2021 1939   SPECREQUEST RIGHT GROIN  ABSCESS 08/13/2021 1939   CULT  08/13/2021 1939    FEW ARCANOBACTERIUM HAEMOLYTICUM Standardized susceptibility testing for this organism is not available. MIXED ANAEROBIC FLORA PRESENT.  CALL LAB IF FURTHER IID REQUIRED.    REPTSTATUS 08/18/2021 FINAL 08/13/2021 1939     Radiology Studies: No results found.    LOS: 9 days   Total time spent in review of labs and imaging, patient evaluation, formulation of plan, documentation and communication with patient/family: 35 minutes  Lanae Boast, MD [facility] 07/07/2023, 11:25 AM

## 2023-07-07 NOTE — Progress Notes (Signed)
 7 Days Post-Op  Subjective: Pain is overall improved today from his bony pain after adjustments by oncology yesterday.  Says his abdominal pain is minimal and very trivial compared to other pain.  Tolerated CLD yesterday with no N/V  ROS: See above, otherwise other systems negative  Objective: Vital signs in last 24 hours: Temp:  [97.8 F (36.6 C)-98.6 F (37 C)] 98 F (36.7 C) (04/08 0532) Pulse Rate:  [99-105] 105 (04/08 0911) Resp:  [16-20] 18 (04/08 0532) BP: (117-158)/(78-102) 158/102 (04/08 0911) SpO2:  [90 %-97 %] 96 % (04/08 0532) Last BM Date : 07/05/23  Intake/Output from previous day: 04/07 0701 - 04/08 0700 In: 440 [P.O.:240; IV Piggyback:200] Out: 300 [Urine:300] Intake/Output this shift: No intake/output data recorded.  PE: Gen: comfort sitting up in chair Lungs: nonlabored today Abd: soft, ND, less pain today and overall very minimal to palpation  Lab Results:  Recent Labs    07/05/23 0348 07/06/23 0348  WBC 9.2 10.5  HGB 8.6* 9.0*  HCT 28.3* 28.7*  PLT 21* 20*   BMET Recent Labs    07/06/23 0348 07/07/23 0349  NA 130* 130*  K 3.8 4.0  CL 98 99  CO2 22 23  GLUCOSE 95 197*  BUN 18 21*  CREATININE 0.51* 0.70  CALCIUM 7.4* 7.2*   PT/INR No results for input(s): "LABPROT", "INR" in the last 72 hours. CMP     Component Value Date/Time   NA 130 (L) 07/07/2023 0349   NA 139 04/18/2022 1047   K 4.0 07/07/2023 0349   CL 99 07/07/2023 0349   CO2 23 07/07/2023 0349   GLUCOSE 197 (H) 07/07/2023 0349   BUN 21 (H) 07/07/2023 0349   BUN 18 04/18/2022 1047   CREATININE 0.70 07/07/2023 0349   CREATININE 0.66 06/23/2023 1558   CREATININE 1.07 01/04/2015 0815   CALCIUM 7.2 (L) 07/07/2023 0349   PROT 5.0 (L) 07/07/2023 0349   ALBUMIN 1.6 (L) 07/07/2023 0349   AST 102 (H) 07/07/2023 0349   AST 29 06/23/2023 1558   ALT 42 07/07/2023 0349   ALT 11 06/23/2023 1558   ALKPHOS 610 (H) 07/07/2023 0349   BILITOT 3.2 (H) 07/07/2023 0349   BILITOT  0.7 06/23/2023 1558   GFRNONAA >60 07/07/2023 0349   GFRNONAA >60 06/23/2023 1558   GFRAA 115 02/08/2020 1128   GFRAA >60 08/22/2019 1118   Lipase     Component Value Date/Time   LIPASE 33 08/13/2021 1235       Studies/Results: No results found.  Anti-infectives: Anti-infectives (From admission, onward)    Start     Dose/Rate Route Frequency Ordered Stop   07/04/23 1100  cefTRIAXone (ROCEPHIN) 2 g in sodium chloride 0.9 % 100 mL IVPB        2 g 200 mL/hr over 30 Minutes Intravenous Every 24 hours 07/04/23 1027 07/09/23 1059   07/04/23 1100  metroNIDAZOLE (FLAGYL) IVPB 500 mg        500 mg 100 mL/hr over 60 Minutes Intravenous Every 12 hours 07/04/23 1027          Assessment/Plan Metastatic colon cancer with bony mets, cholelithiasis, possible cholecystitis -some concern over the weekend for cholecystitis given some rise in his LFTs and abdominal pain, worse on right side - alk phos can been chronically elevated due to his bony mets, but TB and AST/ALT elevation new over the last several days. -[place] with stones, distention, and wall thickening of only 3mm.  No ductal dilatation, etc.  Equivocal for cholecystitis.  HIDA was ordered, but on chronic pain meds which puts him at risk for a false positive result so this was cancelled -given his current situation as well as low plts, etc. Risk of intervention whether surgery or perc chole drain when cholecystitis hasn't been confirmed is quite high. -his symptoms are improving with abx therapy and he is overall feeling well, AF, and WBC was normal yesterday. -adv to D3 diet as that was what he was on originally.  If he tolerates this he could adv to a regular diet.  He doesn't have dysphagia.  I was told the D3 was his way of eating softer foods for comfort.   -would treat for 7-10 days of abx therapy. -no surgical plans for this patient.  We will be available as needed.  FEN - D3, may adv as he tolerates VTE - on hold due to  anemia, thrombocytopenia ID - Rocephin/Flagyl, would treat with IV/oral for a total of 7-10 days  ABL anemia Thrombocytopenia HTN HLD DM  I reviewed Consultant oncology notes, hospitalist notes, last 24 h vitals and pain scores, last 48 h intake and output, last 24 h labs and trends, and last 24 h imaging results.   LOS: 9 days    Letha Cape , [facility] Surgery 07/07/2023, 9:27 AM Please see Amion for pager number during day hours 7:00am-4:30pm or 7:00am -11:30am on weekends

## 2023-07-07 NOTE — Progress Notes (Addendum)
 [name]   DOB:May 07, 1965   VF#:[id number]      ASSESSMENT & PLAN:  Adenocarcinoma of colon, metastatic - Moderate to poorly differentiated - Diagnosed 01/07/2019 - Status post left colectomy 03/04/2019.  Status post FOLFOX chemotherapy from 2022 2021 x 12 cycles, completed 08/22/2019. - Subsequently started on chemotherapy regimen FOLFIRI/bevacizumab on 05/26/2022.  Completed cycle 24 on 05/06/2023. - Started on cycle 1 FOLFOX/Avastin on 06/11/2023 - Consideration for C2 FOLFOX without Bevacizumab previously planned, however due to rising bili may need to hold.   - CODE STATUS discussed with patient and patient and wife has made decision to remain full code.  [name] will continue discussions with Mr. Cieslinski and his wife regarding the indication for hospice care.    Acute on chronic thrombocytopenia -Platelets low 20K on 4/7, repeat CBC with differential pending - Recommend transfusion of platelets for counts <20 K or <50 K with active bleeding - GI recommended holding off on anticoagulation for at least 1 week.  - Continue to hold anticoagulation due to low platelets.  - Continue to monitor CBC with differential and transfuse as needed   Acute rectal bleeding, resolved Acute blood loss anemia Acute on chronic anemia - Low hemoglobin likely related to lower GI bleed.  Patient reports no further rectal bleeding since colonoscopy done 06/30/2023. - Hemoglobin stable 9.0 on 4/7  - Endoscopy and colonoscopy done 06/30/2023.  Colonoscopy prep was inadequate.  Mucosal ulceration in the distal descending colon, visible oozing blood vessel endoscopically treated.  EGD showed esophagitis with no other signs of bleeding. - Transfuse PRBC for Hgb <7.0 - Continue to monitor CBC with differential closely   Hyperbilirubinemia Transaminitis Possible cholecystitis - Unclear etiology.  Jaundice and scleral icterus improving.  - Bilirubin and LFTs continue to decrease.   - Previously scheduled for  HIDA scan.  On hold for now. - Continue antibiotics with Rocephin as ordered, status post Flagyl. - Monitor closely   Severe pain Bilateral lower extremity weakness - Improving per patient.  Complained of bone pain in hips, shoulders and bilateral lower extremities with weakness  - Secondary to extensive bone mets - Continue judicious pain management as ordered with daily steroids.   - PT/OT eval   Diabetes Hypertension - Continue monitoring blood sugar levels and insulin per sliding scale - Continue antihypertensives as ordered - Monitor closely        Code Status Full  Subjective:  Patient seen awake alert and oriented x 3 laying supine in bed.  Reports that he feels "a lot better" since yesterday.  Indeed patient demonstrated lifting bilateral knees up in bed.  Very happy that he can do that since yesterday and feels he is on his way to ambulating again.  Denies active bleeding.  Denies abdominal pain at this time.  Reports that he did have a bowel movement earlier.  Objective:  Vitals:   07/07/23 0911 07/07/23 0959  BP: (!) 158/102   Pulse: (!) 105   Resp:    Temp:    SpO2:  98%     Intake/Output Summary (Last 24 hours) at 07/07/2023 1133 Last data filed at 07/07/2023 8416 Gross per 24 hour  Intake 440 ml  Output 900 ml  Net -460 ml     REVIEW OF SYSTEMS:   Constitutional: +Bilateral lower extremity weakness, +bone pain improving, denies fevers, chills or abnormal night sweats Eyes: Denies blurriness of vision, double vision or watery eyes Ears, nose, mouth, throat, and face: Denies mucositis or sore  throat Respiratory: Denies cough, dyspnea or wheezes Cardiovascular: Denies palpitation, chest discomfort or lower extremity swelling Gastrointestinal:  Denies nausea, heartburn or change in bowel habits Skin: Denies abnormal skin rashes Lymphatics: Denies new lymphadenopathy or easy bruising Neurological: Denies numbness, tingling or new  weaknesses Behavioral/Psych: Mood is stable, no new changes  All other systems were reviewed with the patient and are negative.  PHYSICAL EXAMINATION: ECOG PERFORMANCE STATUS: 3 - Symptomatic, >50% confined to bed  Vitals:   07/07/23 0911 07/07/23 0959  BP: (!) 158/102   Pulse: (!) 105   Resp:    Temp:    SpO2:  98%   Filed Weights   06/28/23 0929  Weight: 180 lb (81.6 kg)    GENERAL: alert, no distress and comfortable SKIN: + Pale skin color, texture, turgor are normal, no rashes or significant lesions EYES: normal, conjunctiva are pink and non-injected, sclera clear OROPHARYNX: no exudate, no erythema and lips, buccal mucosa, and tongue normal  NECK: supple, thyroid normal size, non-tender, without nodularity LYMPH: no palpable lymphadenopathy in the cervical, axillary or inguinal LUNGS: clear to auscultation and percussion with normal breathing effort HEART: regular rate & rhythm and no murmurs and no lower extremity edema ABDOMEN: abdomen soft, non-tender and normal bowel sounds MUSCULOSKELETAL: no cyanosis of digits and no clubbing  PSYCH: alert & oriented x 3 with fluent speech NEURO: no focal motor/sensory deficits   All questions were answered. The patient knows to call the clinic with any problems, questions or concerns.   The total time spent in the appointment was 40 minutes encounter with patient including review of chart and various tests results, discussions about plan of care and coordination of care plan  [name], NP 07/07/2023 11:33 AM    Labs Reviewed:  Lab Results  Component Value Date   WBC 10.5 07/06/2023   HGB 9.0 (L) 07/06/2023   HCT 28.7 (L) 07/06/2023   MCV 99.7 07/06/2023   PLT 20 (LL) 07/06/2023   Recent Labs    07/02/23 0330 07/03/23 0356 07/03/23 1244 07/04/23 0425 07/05/23 0348 07/06/23 0348 07/07/23 0349  NA 126*   < >  --    < > 131* 130* 130*  K 4.1   < >  --    < > 4.0 3.8 4.0  CL 98   < >  --    < > 98 98 99  CO2  21*   < >  --    < > 23 22 23   GLUCOSE 118*   < >  --    < > 126* 95 197*  BUN 14   < >  --    < > 16 18 21*  CREATININE 0.53*   < >  --    < > 0.43* 0.51* 0.70  CALCIUM 7.4*   < >  --    < > 7.4* 7.4* 7.2*  GFRNONAA >60   < >  --    < > >60 >60 >60  PROT 4.9*   < >  --    < > 4.9* 5.0* 5.0*  ALBUMIN 1.6*   < >  --    < > 1.5* 1.7* 1.6*  AST 63*   < >  --    < > 113* 119* 102*  ALT 21   < >  --    < > 42 45* 42  ALKPHOS 465*   < >  --    < > 571* 648* 610*  BILITOT 4.9*   < > 5.4*   < > 3.9* 3.4* 3.2*  BILIDIR 2.9*  --  3.4*  --   --   --   --   IBILI  --   --  2.0*  --   --   --   --    < > = values in this interval not displayed.    Studies Reviewed:  US ABDOMEN LIMITED RUQ (LIVER/GB) Result Date: 07/03/2023 CLINICAL DATA:  Transaminitis EXAM: ULTRASOUND ABDOMEN LIMITED RIGHT UPPER QUADRANT COMPARISON:  CT 06/28/2023 and older FINDINGS: Gallbladder: Distended gallbladder with sludge and stones. Borderline wall thickening of 3 mm. No sonographic Murphy's sign reported. Trace adjacent fluid suggested. Common bile duct: Diameter: 3 mm Liver: No focal lesion identified. Within normal limits in parenchymal echogenicity. Portal vein is patent on color Doppler imaging with normal direction of blood flow towards the liver. Other: Right pleural effusion. IMPRESSION: Distended gallbladder with sludge and stones. Borderline wall thickening. Question trace adjacent fluid. No ductal dilatation. There is further concern of acute cholecystitis a HIDA scan could be considered. Right pleural effusion. Electronically Signed   By: Karen Kays M.D.   On: 07/03/2023 16:02   CT ANGIO GI BLEED Result Date: 06/28/2023 CLINICAL DATA:  Lower GI bleeding. Mesenteric ischemia, acute. Metastatic colon cancer EXAM: CTA ABDOMEN AND PELVIS WITHOUT AND WITH CONTRAST TECHNIQUE: Multidetector CT imaging of the abdomen and pelvis was performed using the standard protocol during bolus administration of intravenous contrast.  Multiplanar reconstructed images and MIPs were obtained and reviewed to evaluate the vascular anatomy. RADIATION DOSE REDUCTION: This exam was performed according to the departmental dose-optimization program which includes automated exposure control, adjustment of the mA and/or kV according to patient size and/or use of iterative reconstruction technique. CONTRAST:  80mL OMNIPAQUE IOHEXOL 350 MG/ML SOLN COMPARISON:  06/04/2023 FINDINGS: VASCULAR Aorta: Normal caliber aorta without aneurysm, dissection, vasculitis or significant stenosis. Mild atherosclerosis. Celiac: Patent without evidence of aneurysm, dissection, vasculitis or significant stenosis. SMA: Patent without evidence of aneurysm, dissection, vasculitis or significant stenosis. Renals: The right renal artery is patent without evidence of aneurysm, dissection, vasculitis, fibromuscular dysplasia or significant stenosis. Patient has had a left nephrectomy. IMA: The origin and proximal aspect of the inferior mesenteric artery is not identified. This does appear to be reconstituted by branches of the superior mesenteric artery. Inflow: Patent without evidence of aneurysm, dissection, vasculitis or significant stenosis. Proximal Outflow: Bilateral common femoral and visualized portions of the superficial and profunda femoral arteries are patent without evidence of aneurysm, dissection, vasculitis or significant stenosis. Veins: Major venous structures are patent. Review of the MIP images confirms the above findings. NON-VASCULAR Lower chest: Small layering left pleural effusion. Hepatobiliary: 1.1 cm hypoattenuating lesion in the periphery of the right lower lobe, not definitively seen on the prior study (series 18, image 29. Previously described subtly hypoattenuating lesions within the liver at the gallbladder fossa and IVC are difficult to characterize but do not appear appreciably changed. Atrophy of the left hepatic lobe. Right hepatic lobe measures 22  cm in length. Small stone within the gallbladder. No biliary dilatation. Pancreas: Grossly unremarkable. Spleen: Grossly unchanged appearance of the spleen with multiple heterogeneously areas of hypoattenuation including dominant lesion measuring approximately 11.2 x 8.5 cm. 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 Adrenals/Urinary Tract: Thickened appearance of the adrenal glands. Prior left nephrectomy. Unremarkable right kidney. No stone or hydronephrosis. Unremarkable urinary bladder. Stomach/Bowel: Tiny hiatal hernia. Stomach otherwise within normal limits. No  abnormally dilated loops of bowel. Postsurgical changes from prior sigmoid resection and reanastomosis abnormal colonic wall thickening in the region of the distal sigmoid colon (series 18, image 66). There is also focal colonic wall thickening just distal to the anastomotic site (series 18, image 57). Normal appendix in the right lower quadrant (series 18, image 53). No evidence of abnormal contrast accumulation within the bowel on multiphasic imaging to localize site of active gastrointestinal bleeding. Lymphatic: Mildly prominent left external iliac chain lymph node measuring 9 mm (series 18, image 59), unchanged. No new or enlarging abdominopelvic lymph nodes. Reproductive: Prostatomegaly. Other: No ascites or pneumoperitoneum. Musculoskeletal: Extensive, widespread osseous metastatic disease, without evidence of significant progression compared to the prior study. Scoliotic spinal curvature. No pathologic fractures are seen. IMPRESSION: 1. The origin and proximal aspect of the inferior mesenteric artery is not identified. This does appear to be reconstituted by branches of the superior mesenteric artery. Otherwise, no significant vascular abnormality. 2. No evidence of abnormal contrast accumulation within the bowel to localize site of active gastrointestinal bleeding. 3. Postsurgical changes from prior sigmoid resection  and reanastomosis. Abnormal colonic wall thickening in the region of the distal sigmoid colon and just distal to the anastomotic site. Findings may represent sites of focal colitis, however recurrent malignancy is not excluded. Follow-up colonoscopy should be considered following the resolution of patient's acute symptoms. 4. New 1.1 cm hypoattenuating lesion in the periphery of the right hepatic lobe, not definitively seen on the prior study. This is concerning for a new site of metastatic disease. 5. Extensive, widespread osseous metastatic disease, without evidence of significant progression compared to the prior study. 6. Small layering left pleural effusion. 7. Cholelithiasis. 8. Aortic atherosclerosis. Electronically Signed   By: Duanne Guess D.O.   On: 06/28/2023 11:18  Mr. Pettet was interviewed and examined.  He reports significant pain improvement beginning yesterday.  The improved pain may be related to the increased dose of MS Contin, addition of Decadron, or antibiotic therapy.  The bilirubin remains elevated, but has improved over the past few days.  The hyperbilirubinemia could be related to gallbladder disease or hemolysis.  The hemoglobin and platelet count are stable today.  I think it is likely the thrombocytopenia secondary to metastatic disease involving the bone marrow complicated by chemotherapy, but it is possible cholecystitis is contributing.  I discussed his current status and disposition plans with Mr. Ridgley and his wife.  He understands the poor prognosis.  He would like to continue working on physical therapy and attempt to return home and potentially resume chemotherapy.  I explained the risk of severe thrombocytopenia and bleeding if he were to receive chemotherapy at present.  Recommendations: Continue MS Contin, Dilaudid, and Decadron Continue antibiotics as recommended by the surgical service Continue PT/OT, out of bed as tolerated Continue discussions regarding  disposition plan-he would like to consider an inpatient rehabilitation stay, but it is unclear whether he is a candidate Consider cycle 2 FOLFOX as an inpatient or outpatient depending on his clinical status and platelet count

## 2023-07-07 NOTE — Inpatient Diabetes Management (Signed)
 Inpatient Diabetes Program Recommendations  AACE/ADA: New Consensus Statement on Inpatient Glycemic Control (2015)  Target Ranges:  Prepandial:   less than 140 mg/dL      Peak postprandial:   less than 180 mg/dL (1-2 hours)      Critically ill patients:  140 - 180 mg/dL   Lab Results  Component Value Date   GLUCAP 206 (H) 07/07/2023   HGBA1C 9.5 (H) 05/25/2023    Review of Glycemic Control  Latest Reference Range & Units 07/06/23 07:22 07/06/23 11:40 07/06/23 16:32 07/06/23 20:19 07/07/23 07:32  Glucose-Capillary 70 - 99 mg/dL 161 (H) 096 (H) 045 (H) 217 (H) 206 (H)   Diabetes history: DM 2 Outpatient Diabetes medications: Toujeo 41 units Daily, metformin 1000 mg bid, Actos 30 mg Daily, Prandin 2 mg bid with meals Current orders for Inpatient glycemic control:  Semglee 10 units Daily Novolog 0-15 units tid and hs  Decadron 4 mg Daily started on 4/7  Inpatient Diabetes Program Recommendations:    -   Start Novolog 2 units tid meal coverage while on steroids  Thanks,  Christena Deem RN, MSN, BC-ADM Inpatient Diabetes Coordinator Team Pager [phone] (8a-5p)

## 2023-07-07 NOTE — Progress Notes (Signed)
 Physical Therapy Treatment Patient Details Name: [name] MRN: [id number] DOB: 1965-06-14 Today's Date: 07/07/2023   History of Present Illness 58 yo male admitted with LGIB, anemia, weakness. Hx of met colon Ca, DM, DVT, discitis, endocarditis, chronc hyponatremia    PT Comments  Pt agreeable to working with therapy. Pain rated 6/10-pt reports pain level is good today. He was highly motivated to ambulate. Tolerated ambulation well. Improved activity tolerance on today. Continued to encourage pt to perform APs, quad sets, and glute sets as tolerated. OT following for UE exercises as tolerated-pt using squeeze ball. Will continue to progress activity as tolerated.     If plan is discharge home, recommend the following: A lot of help with bathing/dressing/bathroom;Assistance with cooking/housework;Assist for transportation;Help with stairs or ramp for entrance;A little help with walking and/or transfers   Can travel by private vehicle        Equipment Recommendations  None recommended by PT    Recommendations for Other Services OT consult     Precautions / Restrictions Precautions Precautions: Fall Precaution/Restrictions Comments: pre-medicate? Restrictions Weight Bearing Restrictions Per Provider Order: No     Mobility  Bed Mobility Overal bed mobility: Needs Assistance Bed Mobility: Sidelying to Sit   Sidelying to sit: HOB elevated, Used rails, Min assist       General bed mobility comments: Increased time. Cues provided. Small amount of assist provided.    Transfers Overall transfer level: Needs assistance Equipment used: Rolling walker (2 wheels) Transfers: Sit to/from Stand Sit to Stand: Min assist, +2 safety/equipment, +2 physical assistance, From elevated surface           General transfer comment: Cues for safety, technique, hand placement, pursed lip breathing. Assist to power up, stabilize, control descent. Increased time. x 2 sit to stands during  session.    Ambulation/Gait Ambulation/Gait assistance: Min assist, +2 safety/equipment Gait Distance (Feet): 20 Feet (x2) Assistive device: Rolling walker (2 wheels) Gait Pattern/deviations: Step-through pattern, Decreased stride length, Trunk flexed       General Gait Details: Assist to stabilize pt throughout distance and for close follow with recliner. Seated reat break taken as needed. Pt tolerated distance well.   Stairs             Wheelchair Mobility     Tilt Bed    Modified Rankin (Stroke Patients Only)       Balance Overall balance assessment: Needs assistance         Standing balance support: Bilateral upper extremity supported, During functional activity, Reliant on assistive device for balance Standing balance-Leahy Scale: Poor                              Communication Communication Communication: No apparent difficulties  Cognition Arousal: Alert Behavior During Therapy: WFL for tasks assessed/performed   PT - Cognitive impairments: No apparent impairments                         Following commands: Intact      Cueing Cueing Techniques: Verbal cues  Exercises      General Comments        Pertinent Vitals/Pain Pain Assessment Pain Assessment: 0-10 Pain Score: 6  Pain Location: spine, all 4 extremities Pain Descriptors / Indicators: Grimacing, Guarding, Aching, Tightness Pain Intervention(s): Monitored during session, Limited activity within patient's tolerance, Repositioned    Home Living  Prior Function            PT Goals (current goals can now be found in the care plan section) Progress towards PT goals: Progressing toward goals    Frequency    Min 2X/week      PT Plan      Co-evaluation              AM-PAC PT "6 Clicks" Mobility   Outcome Measure  Help needed turning from your back to your side while in a flat bed without using bedrails?: A  Little Help needed moving from lying on your back to sitting on the side of a flat bed without using bedrails?: A Little Help needed moving to and from a bed to a chair (including a wheelchair)?: A Little Help needed standing up from a chair using your arms (e.g., wheelchair or bedside chair)?: A Little Help needed to walk in hospital room?: A Lot Help needed climbing 3-5 steps with a railing? : Total 6 Click Score: 15    End of Session Equipment Utilized During Treatment: Gait belt Activity Tolerance: Patient tolerated treatment well Patient left: in chair;with call bell/phone within reach   PT Visit Diagnosis: Pain;Muscle weakness (generalized) (M62.81);Difficulty in walking, not elsewhere classified (R26.2) Pain - part of body: Shoulder;Hip;Knee;Leg;Ankle and joints of foot     Time: 1130-1145 PT Time Calculation (min) (ACUTE ONLY): 15 min  Charges:    $Gait Training: 8-22 mins PT General Charges $$ ACUTE PT VISIT: 1 Visit                        Faye Ramsay, PT Acute Rehabilitation  Office: [phone]

## 2023-07-08 ENCOUNTER — Inpatient Hospital Stay: Admitting: Nurse Practitioner

## 2023-07-08 ENCOUNTER — Inpatient Hospital Stay

## 2023-07-08 ENCOUNTER — Other Ambulatory Visit (HOSPITAL_COMMUNITY): Payer: Self-pay

## 2023-07-08 DIAGNOSIS — K922 Gastrointestinal hemorrhage, unspecified: Secondary | ICD-10-CM | POA: Diagnosis not present

## 2023-07-08 LAB — GLUCOSE, CAPILLARY
Glucose-Capillary: 145 mg/dL — ABNORMAL HIGH (ref 70–99)
Glucose-Capillary: 265 mg/dL — ABNORMAL HIGH (ref 70–99)
Glucose-Capillary: 214 mg/dL — ABNORMAL HIGH (ref 70–99)
Glucose-Capillary: 212 mg/dL — ABNORMAL HIGH (ref 70–99)

## 2023-07-08 LAB — COMPREHENSIVE METABOLIC PANEL WITH GFR
ALT: 41 U/L (ref 0–44)
AST: 106 U/L — ABNORMAL HIGH (ref 15–41)
Albumin: 1.7 g/dL — ABNORMAL LOW (ref 3.5–5.0)
Alkaline Phosphatase: 835 U/L — ABNORMAL HIGH (ref 38–126)
Anion gap: 9 (ref 5–15)
BUN: 22 mg/dL — ABNORMAL HIGH (ref 6–20)
CO2: 22 mmol/L (ref 22–32)
Calcium: 7.4 mg/dL — ABNORMAL LOW (ref 8.9–10.3)
Chloride: 96 mmol/L — ABNORMAL LOW (ref 98–111)
Creatinine, Ser: 0.6 mg/dL — ABNORMAL LOW (ref 0.61–1.24)
GFR, Estimated: >60 mL/min (ref >60)
Glucose, Bld: 164 mg/dL — ABNORMAL HIGH (ref 70–99)
Potassium: 4.2 mmol/L (ref 3.5–5.1)
Sodium: 127 mmol/L — ABNORMAL LOW (ref 135–145)
Total Bilirubin: 2.8 mg/dL — ABNORMAL HIGH (ref 0.0–1.2)
Total Protein: 4.6 g/dL — ABNORMAL LOW (ref 6.5–8.1)

## 2023-07-08 LAB — CBC WITH DIFFERENTIAL/PLATELET
Abs Immature Granulocytes: 1.6 10*3/uL — ABNORMAL HIGH (ref 0.00–0.07)
Band Neutrophils: 9 %
Basophils Absolute: 0.1 10*3/uL (ref 0.0–0.1)
Basophils Relative: 1 %
Eosinophils Absolute: 0 10*3/uL (ref 0.0–0.5)
Eosinophils Relative: 0 %
HCT: 27.2 % — ABNORMAL LOW (ref 39.0–52.0)
Hemoglobin: 8.4 g/dL — ABNORMAL LOW (ref 13.0–17.0)
Lymphocytes Relative: 1 %
Lymphs Abs: 0.1 10*3/uL — ABNORMAL LOW (ref 0.7–4.0)
MCH: 31.7 pg (ref 26.0–34.0)
MCHC: 30.9 g/dL (ref 30.0–36.0)
MCV: 102.6 fL — ABNORMAL HIGH (ref 80.0–100.0)
Metamyelocytes Relative: 6 %
Monocytes Absolute: 0.7 10*3/uL (ref 0.1–1.0)
Monocytes Relative: 5 %
Myelocytes: 6 %
Neutro Abs: 10.7 10*3/uL — ABNORMAL HIGH (ref 1.7–7.7)
Neutrophils Relative %: 72 %
Platelets: 17 10*3/uL — CL (ref 150–400)
RBC: 2.65 MIL/uL — ABNORMAL LOW (ref 4.22–5.81)
RDW: 20.6 % — ABNORMAL HIGH (ref 11.5–15.5)
WBC: 13.2 10*3/uL — ABNORMAL HIGH (ref 4.0–10.5)
nRBC: 15 /100{WBCs} — ABNORMAL HIGH
nRBC: 8.1 % — ABNORMAL HIGH (ref 0.0–0.2)

## 2023-07-08 NOTE — Progress Notes (Signed)
 Inpatient Rehabilitation Admissions Coordinator   I have received insurance approval for [facility] admit. I spoke with wife by phone at his bedside and she is aware and in agreement. I will alert acute team and TOC of possible admit for Thursday.  Ottie Glazier, RN, MSN Rehab Admissions Coordinator [phone] 07/08/2023 4:45 PM

## 2023-07-08 NOTE — Plan of Care (Signed)

## 2023-07-08 NOTE — Progress Notes (Signed)
  Inpatient Rehabilitation Admissions Coordinator   Discussed  case with Dr Carlis Abbott. I will begin insurance Auth with [employer] for possible [facility] admit pending insurance approval.  Ottie Glazier, RN, MSN Rehab Admissions Coordinator [phone] 07/08/2023 10:05 AM

## 2023-07-08 NOTE — Inpatient Diabetes Management (Signed)
 Inpatient Diabetes Program Recommendations  AACE/ADA: New Consensus Statement on Inpatient Glycemic Control (2015)  Target Ranges:  Prepandial:   less than 140 mg/dL      Peak postprandial:   less than 180 mg/dL (1-2 hours)      Critically ill patients:  140 - 180 mg/dL   Lab Results  Component Value Date   GLUCAP 265 (H) 07/08/2023   HGBA1C 9.5 (H) 05/25/2023    Review of Glycemic Control  Diabetes history: DM2 Outpatient Diabetes medications: Toujeo 41 units Daily, metformin 1000 mg bid, Actos 30 mg Daily, Prandin 2 mg bid with meals  Current orders for Inpatient glycemic control: Semglee 10 daily, Novolog 0-15 TID with meals and 0-5 HS  On Decadron 4 mg daily Post-prandials elevated   Inpatient Diabetes Program Recommendations:    Consider adding Novolog 2 units TID with meals if eating > 50%  Will continue to follow.  Thank you. Ailene Ards, RD, LDN, CDCES Inpatient Diabetes Coordinator [phone]

## 2023-07-08 NOTE — Progress Notes (Addendum)
 [name]   DOB:1966/02/07   RJ#:[id number]      ASSESSMENT & PLAN:  Adenocarcinoma of colon, metastatic - Moderate to poorly differentiated - Diagnosed 01/07/2019 - Status post left colectomy 03/04/2019.  Status post FOLFOX chemotherapy from 2022 2021 x 12 cycles, completed 08/22/2019. - Subsequently started on chemotherapy regimen FOLFIRI/bevacizumab on 05/26/2022.  Completed cycle 24 on 05/06/2023. - Started on cycle 1 FOLFOX/Avastin on 06/11/2023 - Consideration for C2 FOLFOX without Bevacizumab previously planned, however due to rising bili may need to hold.   - CODE STATUS discussed with patient and patient and wife has made decision to remain full code.  [name] will continue discussions with Mr. Goncalves and his wife regarding the indication for hospice care.    Severe pain Bilateral lower extremity weakness - Significant improvement of hip and shoulders bone pain and improvement in bil LE weakness per patient.  - Secondary to extensive bone mets - Continue MS Contin, Dilaudid and Decadron as ordered.  - Continue PT/OT and out of bed as tolerated.    Acute on chronic thrombocytopenia - Platelets remain low 17k.  No transfusion recommended.  - Recommend transfusion of platelets for counts <10 K or <50 K with active bleeding - Noted with erythema/conjunctival hemorrhage left eye.  Hold platelet transfusion at this time per [name].  Monitor closely. - GI recommended holding off on anticoagulation for at least 1 week.  - Continue to hold anticoagulation due to low platelets.  - Continue to monitor CBC with differential and transfuse as needed   Acute rectal bleeding, resolved Acute blood loss anemia Acute on chronic anemia - Low hemoglobin likely related to lower GI bleed.  Patient reports no further rectal bleeding since colonoscopy done 06/30/2023. - Hemoglobin 8.4 - Endoscopy and colonoscopy done 06/30/2023.  Colonoscopy prep was inadequate.  Mucosal ulceration in the  distal descending colon, visible oozing blood vessel endoscopically treated.  EGD showed esophagitis with no other signs of bleeding. - Transfuse PRBC for Hgb <7.0 - Continue to monitor CBC with differential closely   Hyperbilirubinemia Transaminitis Possible cholecystitis - Unclear etiology.  Jaundice and scleral icterus improving.  - Bili and LFTs decreasing well.     - Previously scheduled for HIDA scan.  On hold for now. - Continue antibiotics with Rocephin as ordered, status post Flagyl. - Monitor closely   Diabetes Hypertension - Continue monitoring blood sugar levels and insulin per sliding scale - Continue antihypertensives as ordered - Monitor closely      Code Status Full  Subjective:  Patient seen awake alert and oriented x 3 sitting up in chair at bedside.  Reports slightly more pain today than he had yesterday, however had just ambulated with assist from bed to chair. Noted left eye redness, no pain. Patient's parents at bedside. No other complaints offered.   Objective:  Vitals:   07/07/23 2037 07/08/23 0509  BP:  (!) 146/96  Pulse:  100  Resp:  16  Temp:  97.9 F (36.6 C)  SpO2: 97% 99%     Intake/Output Summary (Last 24 hours) at 07/08/2023 1019 Last data filed at 07/08/2023 0700 Gross per 24 hour  Intake 120 ml  Output 500 ml  Net -380 ml     REVIEW OF SYSTEMS:   Constitutional: Denies fevers, chills or abnormal night sweats Eyes: Denies blurriness of vision, double vision or watery eyes Ears, nose, mouth, throat, and face: Denies mucositis or sore throat Respiratory: Denies cough, dyspnea or wheezes Cardiovascular: Denies palpitation,  chest discomfort or lower extremity swelling Gastrointestinal:  +abdominal pain Skin: Denies abnormal skin rashes Lymphatics: Denies new lymphadenopathy or easy bruising Neurological: Denies numbness, tingling or new weaknesses Behavioral/Psych: Mood is stable, no new changes  All other systems were reviewed with  the patient and are negative.  PHYSICAL EXAMINATION: ECOG PERFORMANCE STATUS: 3 - Symptomatic, >50% confined to bed  Vitals:   07/07/23 2037 07/08/23 0509  BP:  (!) 146/96  Pulse:  100  Resp:  16  Temp:  97.9 F (36.6 C)  SpO2: 97% 99%   Filed Weights   06/28/23 0929  Weight: 180 lb (81.6 kg)    GENERAL: alert, no distress and comfortable SKIN: skin color, texture, turgor are normal, no rashes or significant lesions, no ecchymoses or petechiae EYES: +left eye conjunctival hemorrhage +scleral icterus improving OROPHARYNX: no exudate, no erythema and lips, buccal mucosa, and tongue normal  NECK: supple, thyroid normal size, non-tender, without nodularity LYMPH: no palpable lymphadenopathy in the cervical, axillary or inguinal LUNGS: clear to auscultation and percussion with normal breathing effort HEART: regular rate & rhythm and no murmurs  ABDOMEN: abdomen soft, non-tender and normal bowel sounds MUSCULOSKELETAL: no cyanosis of digits and no clubbing  PSYCH: alert & oriented x 3 with fluent speech NEURO: no focal motor/sensory deficits, moves all extremities to command Vascular: Pitting edema at the foot bilaterally   All questions were answered. The patient knows to call the clinic with any problems, questions or concerns.   The total time spent in the appointment was 40 minutes encounter with patient including review of chart and various tests results, discussions about plan of care and coordination of care plan  [name], NP 07/08/2023 10:19 AM    Labs Reviewed:  Lab Results  Component Value Date   WBC 13.2 (H) 07/08/2023   HGB 8.4 (L) 07/08/2023   HCT 27.2 (L) 07/08/2023   MCV 102.6 (H) 07/08/2023   PLT 17 (LL) 07/08/2023  Blood smear 07/08/2023: The platelets are markedly decreased, no platelet clumps.  The polychromasia is increased.  Numerous nucleated red cells.  Ovalocytes, teardrops, and a few helmets/schistocytes.  There are a few myelocytes and  metamyelocytes Recent Labs    07/02/23 0330 07/03/23 0356 07/03/23 1244 07/04/23 0425 07/06/23 0348 07/07/23 0349 07/08/23 0321  NA 126*   < >  --    < > 130* 130* 127*  K 4.1   < >  --    < > 3.8 4.0 4.2  CL 98   < >  --    < > 98 99 96*  CO2 21*   < >  --    < > 22 23 22   GLUCOSE 118*   < >  --    < > 95 197* 164*  BUN 14   < >  --    < > 18 21* 22*  CREATININE 0.53*   < >  --    < > 0.51* 0.70 0.60*  CALCIUM 7.4*   < >  --    < > 7.4* 7.2* 7.4*  GFRNONAA >60   < >  --    < > >60 >60 >60  PROT 4.9*   < >  --    < > 5.0* 5.0* 4.6*  ALBUMIN 1.6*   < >  --    < > 1.7* 1.6* 1.7*  AST 63*   < >  --    < > 119* 102* 106*  ALT 21   < >  --    < >  45* 42 41  ALKPHOS 465*   < >  --    < > 648* 610* 835*  BILITOT 4.9*   < > 5.4*   < > 3.4* 3.2* 2.8*  BILIDIR 2.9*  --  3.4*  --   --   --   --   IBILI  --   --  2.0*  --   --   --   --    < > = values in this interval not displayed.    Studies Reviewed:  US ABDOMEN LIMITED RUQ (LIVER/GB) Result Date: 07/03/2023 CLINICAL DATA:  Transaminitis EXAM: ULTRASOUND ABDOMEN LIMITED RIGHT UPPER QUADRANT COMPARISON:  CT 06/28/2023 and older FINDINGS: Gallbladder: Distended gallbladder with sludge and stones. Borderline wall thickening of 3 mm. No sonographic Murphy's sign reported. Trace adjacent fluid suggested. Common bile duct: Diameter: 3 mm Liver: No focal lesion identified. Within normal limits in parenchymal echogenicity. Portal vein is patent on color Doppler imaging with normal direction of blood flow towards the liver. Other: Right pleural effusion. IMPRESSION: Distended gallbladder with sludge and stones. Borderline wall thickening. Question trace adjacent fluid. No ductal dilatation. There is further concern of acute cholecystitis a HIDA scan could be considered. Right pleural effusion. Electronically Signed   By: Karen Kays M.D.   On: 07/03/2023 16:02   CT ANGIO GI BLEED Result Date: 06/28/2023 CLINICAL DATA:  Lower GI bleeding. Mesenteric  ischemia, acute. Metastatic colon cancer EXAM: CTA ABDOMEN AND PELVIS WITHOUT AND WITH CONTRAST TECHNIQUE: Multidetector CT imaging of the abdomen and pelvis was performed using the standard protocol during bolus administration of intravenous contrast. Multiplanar reconstructed images and MIPs were obtained and reviewed to evaluate the vascular anatomy. RADIATION DOSE REDUCTION: This exam was performed according to the departmental dose-optimization program which includes automated exposure control, adjustment of the mA and/or kV according to patient size and/or use of iterative reconstruction technique. CONTRAST:  80mL OMNIPAQUE IOHEXOL 350 MG/ML SOLN COMPARISON:  06/04/2023 FINDINGS: VASCULAR Aorta: Normal caliber aorta without aneurysm, dissection, vasculitis or significant stenosis. Mild atherosclerosis. Celiac: Patent without evidence of aneurysm, dissection, vasculitis or significant stenosis. SMA: Patent without evidence of aneurysm, dissection, vasculitis or significant stenosis. Renals: The right renal artery is patent without evidence of aneurysm, dissection, vasculitis, fibromuscular dysplasia or significant stenosis. Patient has had a left nephrectomy. IMA: The origin and proximal aspect of the inferior mesenteric artery is not identified. This does appear to be reconstituted by branches of the superior mesenteric artery. Inflow: Patent without evidence of aneurysm, dissection, vasculitis or significant stenosis. Proximal Outflow: Bilateral common femoral and visualized portions of the superficial and profunda femoral arteries are patent without evidence of aneurysm, dissection, vasculitis or significant stenosis. Veins: Major venous structures are patent. Review of the MIP images confirms the above findings. NON-VASCULAR Lower chest: Small layering left pleural effusion. Hepatobiliary: 1.1 cm hypoattenuating lesion in the periphery of the right lower lobe, not definitively seen on the prior study  (series 18, image 29. Previously described subtly hypoattenuating lesions within the liver at the gallbladder fossa and IVC are difficult to characterize but do not appear appreciably changed. Atrophy of the left hepatic lobe. Right hepatic lobe measures 22 cm in length. Small stone within the gallbladder. No biliary dilatation. Pancreas: Grossly unremarkable. Spleen: Grossly unchanged appearance of the spleen with multiple heterogeneously areas of hypoattenuation including dominant lesion measuring approximately 11.2 x 8.5 cm. 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 Adrenals/Urinary Tract: Thickened appearance of the adrenal glands. Prior left  nephrectomy. Unremarkable right kidney. No stone or hydronephrosis. Unremarkable urinary bladder. Stomach/Bowel: Tiny hiatal hernia. Stomach otherwise within normal limits. No abnormally dilated loops of bowel. Postsurgical changes from prior sigmoid resection and reanastomosis abnormal colonic wall thickening in the region of the distal sigmoid colon (series 18, image 66). There is also focal colonic wall thickening just distal to the anastomotic site (series 18, image 57). Normal appendix in the right lower quadrant (series 18, image 53). No evidence of abnormal contrast accumulation within the bowel on multiphasic imaging to localize site of active gastrointestinal bleeding. Lymphatic: Mildly prominent left external iliac chain lymph node measuring 9 mm (series 18, image 59), unchanged. No new or enlarging abdominopelvic lymph nodes. Reproductive: Prostatomegaly. Other: No ascites or pneumoperitoneum. Musculoskeletal: Extensive, widespread osseous metastatic disease, without evidence of significant progression compared to the prior study. Scoliotic spinal curvature. No pathologic fractures are seen. IMPRESSION: 1. The origin and proximal aspect of the inferior mesenteric artery is not identified. This does appear to be reconstituted by  branches of the superior mesenteric artery. Otherwise, no significant vascular abnormality. 2. No evidence of abnormal contrast accumulation within the bowel to localize site of active gastrointestinal bleeding. 3. Postsurgical changes from prior sigmoid resection and reanastomosis. Abnormal colonic wall thickening in the region of the distal sigmoid colon and just distal to the anastomotic site. Findings may represent sites of focal colitis, however recurrent malignancy is not excluded. Follow-up colonoscopy should be considered following the resolution of patient's acute symptoms. 4. New 1.1 cm hypoattenuating lesion in the periphery of the right hepatic lobe, not definitively seen on the prior study. This is concerning for a new site of metastatic disease. 5. Extensive, widespread osseous metastatic disease, without evidence of significant progression compared to the prior study. 6. Small layering left pleural effusion. 7. Cholelithiasis. 8. Aortic atherosclerosis. Electronically Signed   By: Duanne Guess D.O.   On: 06/28/2023 11:18   Mr. Erven was interviewed and examined.  He reports persistent improvement in pain.  He was able to ambulate yesterday.  He had a brown bowel movement.  No bleeding.  Mr. Wiedeman would like to continue physical therapy with goals to become ambulatory and return home.  He hopes to be admitted to the inpatient rehabilitation unit.  He understands he will not be able to receive chemotherapy on this unit.  He has persistent severe thrombocytopenia.  The hemoglobin and platelet count have not changed significantly over the past few days.  The bilirubin is lower.  I continue to feel the thrombocytopenia is most likely related to metastatic colon cancer involving the bone marrow.  I will review the peripheral blood smear again today.  Recommendations: Continue MS Contin/Dilaudid/Decadron for pain Transfuse platelets for a count of less than 10,000 or bleeding Continue PT/OT,  evaluate for inpatient rehabilitation stay Consider additional salvage chemotherapy if his clinical status improves

## 2023-07-08 NOTE — Progress Notes (Signed)
 PROGRESS NOTE [name]  QMV:[id number] DOB: [birth year] DOA: 06/28/2023 PCP: Joycelyn Rua, MD  Brief Narrative/Hospital Course: 58 year old with history of colon cancer on FOLFOX, Avastin follows Dr [name], insulin-dependent DM2, HTN, HLD, DVT on Pradaxa admitted to the hospital for bright red blood per rectum/acute blood loss anemia.  Recently treated for gluteal fold cellulitis.  Last dose of Pradaxa was the night before his hospitalization.  Admission hemoglobin was noted to be 6.5 therefore PRBC transfusion was ordered.  CT angio was negative for active bleeding but showed colonic wall thickening distal to prior anastomosis.  Colonoscopy prep was overall inadequate stool was noted in sigmoid colon.  There was oozing distal descending colon blood vessel which was endoscopically treated.  EGD showed grade B esophagitis and congested duodenal mucosa but no other signs of bleeding.  For now GI recommended holding off on anticoagulation for at least 1 week. Overall remains hemodynamically stable. Due to complaints of decreased hearing on left ear discussed with oncology question if he has metastatic spots at base of skull-advised hospice based upon his overall functional status and metastatic disease AND patient does not feel ready yet.  Consultation: Gastroenterology [facility] Oncology  Procedures/testing: E4/1 : EGD/ COLONOSCOPY (Left)-CONTROL OF HEMORRHAGE, GI TRACT, ENDOSCOPIC, BY CLIPPING OR OVERSEWING SCLEROTHERAPY   Subjective: Seen and examined Resting comfortably in the bedside chair Overnight afebrile BP stable on room air  Labs with sodium slightly down 127 stable renal function LFTs remains elevated although TB continues to improve  CBC with mild leukocytosis chronic anemia platelet further dropped to 17 K Last BM 4/7 Minimal abdominal pain mostly gas pain Pain improved after meds adjusted by oncology  Assessment & Plan:  BRBPR-colorectal ABLA Acute on chronic anemia B/L  hB ~8.0, due to rapid decline in his hemoglobin and multiple brbpr received total 3 units of PRBC-and HB stable since S/P EGD colonoscopy 4/1- prep was overall inadequate,oozing distal descending colon blood vessel which was endoscopically treated.  EGD showed grade B esophagitis and congested duodenal mucosa but no other signs of bleeding.  GI recommended to hold off anticoagulation at least 1 week and gi signed off Cont PPI BID, cont to hold aspirin. Recent Labs  Lab 07/04/23 0425 07/05/23 0348 07/06/23 0348 07/07/23 1151 07/08/23 0321  HGB 8.4* 8.6* 9.0* 8.9* 8.4*  HCT 27.0* 28.3* 28.7* 29.3* 27.2*   Colon cancer, metastatic Anemia and thrombocytopenia, bone marrow suppression from chemo and metastasis Severe cancer-related pain Lower extremity weakness/debility from cancer: Patient has pain due to extensive bone metastasis and has deconditioning and weakness. He is on MS Contin twice daily,  prn p.o./IV narcotics He was on  FOLFOX and Avastin as outpatient  [name] following closely.   Again extensively discussed very difficult situation; patient has severe pain in the setting of his cancer with mets- suspected cholecystitis or elevation of TB due to hemolysis and bone marrow involvement.  HIDA scan ordered but unable to be done due to patient being on narcotics and not able to come off-discussed extensively with patient along with GI and oncology -at this time plan is for antibiotics, no more testing, pain actually much better with current pain regimen.   Continue to engage in GOC.  Acute on chronic thrombocytopenia: Continue to hold anticoagulation monitor CBC and transfuse if < 10k per onco Recent Labs  Lab 07/04/23 0425 07/05/23 0348 07/06/23 0348 07/07/23 1151 07/08/23 0321  PLT 25* 21* 20* 20* 17*    Hyperbilirubinemia/Transaminitis Distended gallbladder with sludge and stones Possible ?  acute cholecystitis: Labs with transaminitis, initially concerned about  hemolysis with bone marrow involvement of metastatic disease, s/p IA RUQ that showed distended gallbladder with sludge and stone borderline wall thickening-surgery was consulted placed on IV antibiotics and HIDA scan ordered to rule out acute cholecystitis-patient is needing continuous narcotics along with scheduled MS Contin, HIDA scan will likely be false-extensively discussed with patient's oncologist, surgery-and also with the patient given difficult situation AND at this time decided to continue antibiotics supportive care pain management HIDA scan discontinued per [facility].Per [facility] he is moderately poor surgical candidate for cholecystectomy, is profoundly thrombocytopenic-and also not a ideal candidate for cholecystostomy drain if HIDA were positive.  TB continues to improve Recent Labs  Lab 07/02/23 0330 07/02/23 1211 07/03/23 0356 07/03/23 1244 07/04/23 0425 07/05/23 0348 07/06/23 0348 07/07/23 0349 07/07/23 1151 07/08/23 0321  AST 63*  --    < >  --  115* 113* 119* 102*  --  106*  ALT 21  --    < >  --  40 42 45* 42  --  41  ALKPHOS 465*  --    < >  --  534* 571* 648* 610*  --  835*  BILITOT 4.9*  --    < > 5.4* 4.8* 3.9* 3.4* 3.2*  --  2.8*  BILIDIR 2.9*  --   --  3.4*  --   --   --   --   --   --   IBILI  --   --   --  2.0*  --   --   --   --   --   --   PROT 4.9*  --    < >  --  5.0* 4.9* 5.0* 5.0*  --  4.6*  ALBUMIN 1.6*  --    < >  --  1.6* 1.5* 1.7* 1.6*  --  1.7*  INR  --  1.3*  --   --   --   --   --   --   --   --   PLT 39*  --    < >  --  25* 21* 20*  --  20* 17*   < > = values in this interval not displayed.   Diminished hearing sensation of the left/fuzziness/ringing since the left ear: Patient was placed in eardrops reports hearing is improving, there is question about if he has metastatic disease involving base of skull MRI brain with and without contrast on 05/25/23 has 2 punctate foci of enhancement on the left parietal occipital region could be metastasis, no acute  finding.  Discussed with oncology continue symptomatic management-[name] discussed with the patient as he is a candidate for hospice, but patient and wife reluctant for hospice for now.Patient reports he wants to try medical management as well as pain management   "SOME OF BOTH WORLD"  IDDMtype 2 with uncontrolled hyperglycemia: At home on insulin glargine 41 u daily, metformin 1000 twice daily and on hold.  Resume insulin at 20 units and on ssi Recent Labs  Lab 07/07/23 1133 07/07/23 1637 07/07/23 2025 07/07/23 2131 07/08/23 0730  GLUCAP 300* 199* 297* 312* 145*    Hypocalcemia Corrected calcium is stable due to hypoalbuminemia  Hypomagnesemia: Replaced   Hyponatremia: Sodium fluctuating monitor encourage oral intake  Recent Labs  Lab 07/04/23 0425 07/05/23 0348 07/06/23 0348 07/07/23 0349 07/08/23 0321  NA 131* 131* 130* 130* 127*    Hyperlipidemia Cont to hold Lipitor  Essential  hypertension BP stable on Toprol-XL 25 mg- cont w/ holding parameters. Lisinopril on hold  History of DVT Pradaxa remains on hold due to GI bleeding- to hold x 1 week or longer given thrombocytopenia. Resume as OP or when ok w/ oncology.  Goals of care/CODE STATUS: Extensively discussed goals of care and encourage DNR/DNI after discussing his case with [name] Per oncology remains full code for now. Dr Truett Perna extensively discussed about hospice at this point of time but patient is not electing to enter hospice.Continue to engage in GOC Patient having ongoing GI issues at this time concern for acute cholecystitis, appears to be not a ideal candidate for cholecystectomy, pending further workup See discussion above .  patient remains high risk for decompensation, and readmission Plan is for discharge to CIR once bed available in 1-2 days  DVT prophylaxis: SCDs Start: 06/28/23 1229 Code Status:   Code Status: Full Code Family Communication: plan of care discussed with  patient Patient status is: Remains hospitalized because of severity of illness Level of care: Med-Surg   Dispo: The patient is from: Home w/ wife and kids.            Anticipated disposition: CIR  once available   Objective: Vitals last 24 hrs: Vitals:   07/07/23 1309 07/07/23 2025 07/07/23 2037 07/08/23 0509  BP: 134/85 (!) 145/93  (!) 146/96  Pulse: 97 95  100  Resp: 20 16  16   Temp: 98.6 F (37 C) (!) 97.5 F (36.4 C)  97.9 F (36.6 C)  TempSrc: Oral Oral  Oral  SpO2: 95% 97% 97% 99%  Weight:      Height:       Weight change:   Physical Examination: General exam: alert awake, appears weak frail debilitated  HEENT:Oral mucosa moist, Ear/Nose WNL grossly Respiratory system: Bilaterally clear BS,no use of accessory muscle Cardiovascular system: S1 & S2 +, No JVD. Gastrointestinal system: Abdomen soft, mild tenderness diffuse ,ND, BS+ Nervous System: Alert, awake, moving all extremities,and following commands. Extremities: LE edema neg,distal peripheral pulses palpable and warm.  Skin: No rashes,no icterus. MSK: Normal muscle bulk,tone, power   Medications reviewed:  Scheduled Meds:  sodium chloride   Intravenous Once   budesonide  0.25 mg Nebulization BID   carbamide peroxide  5 drop Left EAR BID   Chlorhexidine Gluconate Cloth  6 each Topical Daily   ciprofloxacin-hydrocortisone  3 drop Left EAR BID   dexamethasone  4 mg Oral Daily   insulin aspart  0-15 Units Subcutaneous TID WC & HS   insulin glargine-yfgn  10 Units Subcutaneous Daily   loratadine  10 mg Oral Daily   metoprolol succinate  25 mg Oral Daily   morphine  100 mg Oral Q12H   pantoprazole (PROTONIX) IV  40 mg Intravenous Q12H   senna-docusate  1 tablet Oral BID  Continuous Infusions:  cefTRIAXone (ROCEPHIN)  IV 2 g (07/07/23 1209)   metronidazole 500 mg (07/08/23 0931)     Diet Order             Diet regular Room service appropriate? Yes; Fluid consistency: Thin  Diet effective now                   Intake/Output Summary (Last 24 hours) at 07/08/2023 1022 Last data filed at 07/08/2023 0700 Gross per 24 hour  Intake 120 ml  Output 500 ml  Net -380 ml   Net IO Since Admission: 6,328.77 mL [07/08/23 1022]  Wt Readings from Last  3 Encounters:  06/28/23 81.6 kg  06/17/23 81.6 kg  06/10/23 85 kg     Unresulted Labs (From admission, onward)     Start     Ordered   07/07/23 1006  CBC with Differential/Platelet  Daily,   R     Question:  Specimen collection method  Answer:  IV Team=IV Team collect   07/07/23 1005   07/05/23 0500  Comprehensive metabolic panel with GFR  Daily,   R     Question:  Specimen collection method  Answer:  IV Team=IV Team collect   07/04/23 1028          Data Reviewed: I have personally reviewed following labs and imaging studies ( see epic result tab) CBC: Recent Labs  Lab 07/04/23 0425 07/05/23 0348 07/06/23 0348 07/07/23 1151 07/08/23 0321  WBC 8.0 9.2 10.5 13.2* 13.2*  NEUTROABS  --   --   --  9.2* 10.7*  HGB 8.4* 8.6* 9.0* 8.9* 8.4*  HCT 27.0* 28.3* 28.7* 29.3* 27.2*  MCV 99.6 100.0 99.7 102.8* 102.6*  PLT 25* 21* 20* 20* 17*   CMP: Recent Labs  Lab 07/02/23 0330 07/03/23 0356 07/04/23 0425 07/05/23 0348 07/06/23 0348 07/07/23 0349 07/08/23 0321  NA 126* 128* 131* 131* 130* 130* 127*  K 4.1 4.1 4.1 4.0 3.8 4.0 4.2  CL 98 97* 98 98 98 99 96*  CO2 21* 23 22 23 22 23 22   GLUCOSE 118* 214* 128* 126* 95 197* 164*  BUN 14 17 17 16 18  21* 22*  CREATININE 0.53* 0.41* 0.48* 0.43* 0.51* 0.70 0.60*  CALCIUM 7.4* 7.5* 7.7* 7.4* 7.4* 7.2* 7.4*  MG 1.8 1.8 1.7 1.6* 1.6*  --   --    GFR: Estimated Creatinine Clearance: 109.8 mL/min (A) (by C-G formula based on SCr of 0.6 mg/dL (L)). Recent Labs  Lab 07/04/23 0425 07/05/23 0348 07/06/23 0348 07/07/23 0349 07/08/23 0321  AST 115* 113* 119* 102* 106*  ALT 40 42 45* 42 41  ALKPHOS 534* 571* 648* 610* 835*  BILITOT 4.8* 3.9* 3.4* 3.2* 2.8*  PROT 5.0* 4.9* 5.0* 5.0* 4.6*   ALBUMIN 1.6* 1.5* 1.7* 1.6* 1.7*   Recent Labs  Lab 07/07/23 1133 07/07/23 1637 07/07/23 2025 07/07/23 2131 07/08/23 0730  GLUCAP 300* 199* 297* 312* 145*    Antimicrobials/Microbiology: Anti-infectives (From admission, onward)    Start     Dose/Rate Route Frequency Ordered Stop   07/04/23 1100  cefTRIAXone (ROCEPHIN) 2 g in sodium chloride 0.9 % 100 mL IVPB        2 g 200 mL/hr over 30 Minutes Intravenous Every 24 hours 07/04/23 1027 07/09/23 1059   07/04/23 1100  metroNIDAZOLE (FLAGYL) IVPB 500 mg        500 mg 100 mL/hr over 60 Minutes Intravenous Every 12 hours 07/04/23 1027           Component Value Date/Time   SDES ABSCESS 08/13/2021 1939   SPECREQUEST RIGHT GROIN ABSCESS 08/13/2021 1939   CULT  08/13/2021 1939    FEW ARCANOBACTERIUM HAEMOLYTICUM Standardized susceptibility testing for this organism is not available. MIXED ANAEROBIC FLORA PRESENT.  CALL LAB IF FURTHER IID REQUIRED.    REPTSTATUS 08/18/2021 FINAL 08/13/2021 1939     Radiology Studies: No results found.    LOS: 10 days   Total time spent in review of labs and imaging, patient evaluation, formulation of plan, documentation and communication with patient/family: 35 minutes  Lanae Boast, MD [facility] 07/08/2023, 10:22 AM

## 2023-07-08 NOTE — Progress Notes (Signed)
 OT Cancellation Note  Patient Details Name: [name] MRN: [id number] DOB: 1965/11/06   Cancelled Treatment:    Reason Eval/Treat Not Completed: Patient gently declined. He stated he was currently on a call.   Reuben Likes, OTR/L 07/08/2023, 4:58 PM

## 2023-07-09 ENCOUNTER — Other Ambulatory Visit

## 2023-07-09 ENCOUNTER — Ambulatory Visit

## 2023-07-09 ENCOUNTER — Ambulatory Visit: Admitting: Oncology

## 2023-07-09 DIAGNOSIS — K922 Gastrointestinal hemorrhage, unspecified: Secondary | ICD-10-CM | POA: Diagnosis not present

## 2023-07-09 DIAGNOSIS — Z7189 Other specified counseling: Secondary | ICD-10-CM

## 2023-07-09 DIAGNOSIS — C186 Malignant neoplasm of descending colon: Secondary | ICD-10-CM

## 2023-07-09 DIAGNOSIS — C7951 Secondary malignant neoplasm of bone: Secondary | ICD-10-CM

## 2023-07-09 DIAGNOSIS — Z515 Encounter for palliative care: Secondary | ICD-10-CM

## 2023-07-09 LAB — CBC WITH DIFFERENTIAL/PLATELET
Abs Immature Granulocytes: 0.8 10*3/uL — ABNORMAL HIGH (ref 0.00–0.07)
Basophils Absolute: 0 10*3/uL (ref 0.0–0.1)
Basophils Relative: 0 %
Eosinophils Absolute: 0.1 10*3/uL (ref 0.0–0.5)
Eosinophils Relative: 1 %
HCT: 28.3 % — ABNORMAL LOW (ref 39.0–52.0)
Hemoglobin: 8.8 g/dL — ABNORMAL LOW (ref 13.0–17.0)
Lymphocytes Relative: 3 %
Lymphs Abs: 0.4 10*3/uL — ABNORMAL LOW (ref 0.7–4.0)
MCH: 31.4 pg (ref 26.0–34.0)
MCHC: 31.1 g/dL (ref 30.0–36.0)
MCV: 101.1 fL — ABNORMAL HIGH (ref 80.0–100.0)
Metamyelocytes Relative: 1 %
Monocytes Absolute: 0.3 10*3/uL (ref 0.1–1.0)
Monocytes Relative: 2 %
Myelocytes: 5 %
Neutro Abs: 11.9 10*3/uL — ABNORMAL HIGH (ref 1.7–7.7)
Neutrophils Relative %: 88 %
Platelets: 15 10*3/uL — CL (ref 150–400)
RBC: 2.8 MIL/uL — ABNORMAL LOW (ref 4.22–5.81)
RDW: 21.2 % — ABNORMAL HIGH (ref 11.5–15.5)
WBC: 13.5 10*3/uL — ABNORMAL HIGH (ref 4.0–10.5)
nRBC: 13 % — ABNORMAL HIGH (ref 0.0–0.2)

## 2023-07-09 LAB — COMPREHENSIVE METABOLIC PANEL WITH GFR
ALT: 44 U/L (ref 0–44)
AST: 141 U/L — ABNORMAL HIGH (ref 15–41)
Albumin: 1.7 g/dL — ABNORMAL LOW (ref 3.5–5.0)
Alkaline Phosphatase: 1093 U/L — ABNORMAL HIGH (ref 38–126)
Anion gap: 7 (ref 5–15)
BUN: 22 mg/dL — ABNORMAL HIGH (ref 6–20)
CO2: 24 mmol/L (ref 22–32)
Calcium: 7.5 mg/dL — ABNORMAL LOW (ref 8.9–10.3)
Chloride: 99 mmol/L (ref 98–111)
Creatinine, Ser: 0.58 mg/dL — ABNORMAL LOW (ref 0.61–1.24)
GFR, Estimated: >60 mL/min (ref >60)
Glucose, Bld: 156 mg/dL — ABNORMAL HIGH (ref 70–99)
Potassium: 4.3 mmol/L (ref 3.5–5.1)
Sodium: 130 mmol/L — ABNORMAL LOW (ref 135–145)
Total Bilirubin: 3.2 mg/dL — ABNORMAL HIGH (ref 0.0–1.2)
Total Protein: 4.6 g/dL — ABNORMAL LOW (ref 6.5–8.1)

## 2023-07-09 LAB — GLUCOSE, CAPILLARY
Glucose-Capillary: 176 mg/dL — ABNORMAL HIGH (ref 70–99)
Glucose-Capillary: 222 mg/dL — ABNORMAL HIGH (ref 70–99)
Glucose-Capillary: 330 mg/dL — ABNORMAL HIGH (ref 70–99)
Glucose-Capillary: 231 mg/dL — ABNORMAL HIGH (ref 70–99)

## 2023-07-09 MED ORDER — HYDROMORPHONE HCL 1 MG/ML IJ SOLN
0.5000 mg | INTRAMUSCULAR | Status: DC | PRN
  Administered 2023-07-09: 1 mg via INTRAVENOUS
  Filled 2023-07-09: qty 1

## 2023-07-09 MED ORDER — SODIUM CHLORIDE 0.9 % IV SOLN
2.0000 g | INTRAVENOUS | Status: DC
  Administered 2023-07-09 – 2023-07-11 (×3): 2 g via INTRAVENOUS
  Filled 2023-07-09 (×3): qty 20

## 2023-07-09 MED ORDER — INSULIN GLARGINE-YFGN 100 UNIT/ML ~~LOC~~ SOLN
20.0000 [IU] | Freq: Every day | SUBCUTANEOUS | Status: DC
  Filled 2023-07-09: qty 0.2

## 2023-07-09 MED ORDER — DEXAMETHASONE SODIUM PHOSPHATE 4 MG/ML IJ SOLN
4.0000 mg | Freq: Once | INTRAMUSCULAR | Status: AC
  Administered 2023-07-09: 4 mg via INTRAVENOUS
  Filled 2023-07-09: qty 1

## 2023-07-09 MED ORDER — SIMETHICONE 40 MG/0.6ML PO SUSP
40.0000 mg | Freq: Four times a day (QID) | ORAL | Status: DC | PRN
Start: 2023-07-09 — End: 2023-07-11
  Administered 2023-07-09: 40 mg via ORAL
  Filled 2023-07-09 (×3): qty 0.6

## 2023-07-09 MED ORDER — HYDROMORPHONE HCL 1 MG/ML IJ SOLN
0.5000 mg | INTRAMUSCULAR | Status: DC | PRN
Start: 2023-07-09 — End: 2023-07-11
  Administered 2023-07-09 (×3): 1 mg via INTRAVENOUS
  Filled 2023-07-09 (×3): qty 1

## 2023-07-09 MED ORDER — INSULIN GLARGINE-YFGN 100 UNIT/ML ~~LOC~~ SOLN
10.0000 [IU] | Freq: Every day | SUBCUTANEOUS | Status: DC
  Administered 2023-07-09 – 2023-07-11 (×3): 10 [IU] via SUBCUTANEOUS
  Filled 2023-07-09 (×3): qty 0.1

## 2023-07-09 MED ORDER — HYDROMORPHONE HCL 2 MG PO TABS
4.0000 mg | ORAL_TABLET | ORAL | Status: DC | PRN
  Administered 2023-07-09 – 2023-07-11 (×11): 8 mg via ORAL
  Filled 2023-07-09 (×11): qty 4

## 2023-07-09 NOTE — Addendum Note (Signed)
 Encounter addended by: Marcello Fennel, PA-C on: 07/09/2023 12:08 PM  Actions taken: Clinical Note Signed, Visit diagnoses modified

## 2023-07-09 NOTE — Progress Notes (Signed)
 PT Cancellation Note  Patient Details Name: [name] MRN: [id number] DOB: Dec 16, 1965   Cancelled Treatment:    Reason Eval/Treat Not Completed: Pain limiting ability to participate (family member stated pt is in too much pain to tolerate PT today. She requested we check back tomorrow.)   Tamala Ser PT 07/09/2023  Acute Rehabilitation Services  Office [phone]

## 2023-07-09 NOTE — Progress Notes (Signed)
 Inpatient Rehabilitation Admissions Coordinator   Noted goals of care discussions with Dr Truett Perna. We will sign off.  Ottie Glazier, RN, MSN Rehab Admissions Coordinator [phone] 07/09/2023 3:43 PM

## 2023-07-09 NOTE — TOC Transition Note (Signed)
 Transition of Care [facility]) - Discharge Note   Patient Details  Name: [name] MRN: [id number] Date of Birth: [birth year]  [facility]) CM/SW Contact:  Larrie Kass, LCSW Phone Number: 07/09/2023, 8:39 AM   Clinical Narrative:     Pt has been approved for CIR , will transfer when medically stable. TOC will sign off.    Final next level of care:  (CIR) Barriers to Discharge: Continued Medical Work up   Patient Goals and CMS Choice            Discharge Placement                    Patient and family notified of of transfer: 07/09/23  Discharge Plan and Services Additional resources added to the After Visit Summary for                                       Social Drivers of Health (SDOH) Interventions SDOH Screenings   Food Insecurity: No Food Insecurity (06/28/2023)  Housing: Low Risk  (06/28/2023)  Transportation Needs: No Transportation Needs (06/28/2023)  Utilities: Not At Risk (06/28/2023)  Alcohol Screen: Low Risk  (04/28/2023)  Depression (PHQ2-9): Low Risk  (04/28/2023)  Social Connections: Socially Integrated (06/03/2023)  Tobacco Use: Low Risk  (06/28/2023)     Readmission Risk Interventions    06/29/2023   10:35 AM 05/27/2023   12:04 PM  Readmission Risk Prevention Plan  Transportation Screening Complete Complete  PCP or Specialist Appt within 5-7 Days  Complete  Home Care Screening  Complete  Medication Review (RN CM)  Complete  HRI or Home Care Consult Complete   Social Work Consult for Recovery Care Planning/Counseling Complete   Palliative Care Screening Not Applicable   Medication Review Oceanographer) Complete

## 2023-07-09 NOTE — Progress Notes (Addendum)
 [name]   DOB:11-04-1965   WU#:[id number]      ASSESSMENT & PLAN:  Adenocarcinoma of colon, metastatic - Moderate to poorly differentiated - Diagnosed 01/07/2019 - Status post left colectomy 03/04/2019.  Status post FOLFOX chemotherapy from 2022 2021 x 12 cycles, completed 08/22/2019. - Subsequently started on chemotherapy regimen FOLFIRI/bevacizumab on 05/26/2022.  Completed cycle 24 on 05/06/2023. - Started on cycle 1 FOLFOX/Avastin on 06/11/2023 - Consideration for C2 FOLFOX without Bevacizumab previously planned, however due to rising bili may need to hold.   - CODE STATUS discussed with patient and patient and wife has made decision to remain full code.   - [name] will continue discussions with [name] and his wife regarding the indication for hospice care.     Severe pain Bilateral lower extremity weakness - Today patient is complaining of increased bone pain especially in his back and shoulders.  Ongoing bilateral lower extremity weakness although he is able to move lower extremities a little more.    - Secondary to extensive bone mets - Continue MS Contin, Dilaudid and Decadron as ordered.  Recommend palliative evaluation for pain control. - Continue PT/OT and out of bed as tolerated.    Acute on chronic thrombocytopenia - Platelets remain low 15k.  No transfusion recommended.  - Recommend transfusion of platelets for counts <10 K or <50 K with active bleeding - Noted with erythema/conjunctival hemorrhage left eye, worsening today.  Hold platelet transfusion at this time per [name].  Monitor closely. - GI recommended holding off on anticoagulation for at least 1 week.  - Continue to hold anticoagulation due to low platelets.  - Continue to monitor CBC with differential and transfuse as needed   Acute rectal bleeding, resolved Acute blood loss anemia Acute on chronic anemia - Low hemoglobin likely related to lower GI bleed.  Patient reports no further rectal  bleeding since colonoscopy done 06/30/2023. - Hemoglobin 8.8 today - Endoscopy and colonoscopy done 06/30/2023.  Colonoscopy prep was inadequate.  Mucosal ulceration in the distal descending colon, visible oozing blood vessel endoscopically treated.  EGD showed esophagitis with no other signs of bleeding. - Transfuse PRBC for Hgb <7.0 - Continue to monitor CBC with differential closely   Hyperbilirubinemia Transaminitis Possible cholecystitis - Unclear etiology.  Jaundice and scleral icterus   - Bili with slight increase to 3.2    - Previously scheduled for HIDA scan.  On hold for now. - Continue antibiotics with Rocephin as ordered, status post Flagyl. - Monitor closely   Diabetes Hypertension - Continue monitoring blood sugar levels and insulin per sliding scale - Continue antihypertensives as ordered - Monitor closely        Code Status Full  Subjective:  Patient seen awake alert and oriented x 3 laying supine in bed.  Multiple family members at bedside.  Patient reports bone pain is much worse today than it was yesterday.  No other acute complaints offered.  Objective:  Vitals:   07/09/23 0951 07/09/23 1000  BP:  (!) 161/93  Pulse:  (!) 118  Resp:  20  Temp:  98.8 F (37.1 C)  SpO2: 95% 96%     Intake/Output Summary (Last 24 hours) at 07/09/2023 1100 Last data filed at 07/09/2023 0551 Gross per 24 hour  Intake --  Output 300 ml  Net -300 ml     PHYSICAL EXAMINATION: ECOG PERFORMANCE STATUS: 3 - Symptomatic, >50% confined to bed  Vitals:   07/09/23 0951 07/09/23 1000  BP:  Marland Kitchen)  161/93  Pulse:  (!) 118  Resp:  20  Temp:  98.8 F (37.1 C)  SpO2: 95% 96%   Filed Weights   06/28/23 0929  Weight: 180 lb (81.6 kg)    GENERAL: alert, + moderate distress complains of bone pain  SKIN: skin color, texture, turgor are normal, no rashes or significant lesions EYES: + Left eye conjunctival hemorrhage OROPHARYNX: no exudate, no erythema and lips, buccal mucosa,  and tongue normal  NECK: supple, thyroid normal size, non-tender, without nodularity LYMPH: no palpable lymphadenopathy in the cervical, axillary or inguinal LUNGS: clear to auscultation and percussion with normal breathing effort HEART: regular rate & rhythm and no murmurs and no lower extremity edema ABDOMEN: abdomen soft, non-tender and normal bowel sounds MUSCULOSKELETAL: no cyanosis of digits and no clubbing  PSYCH: alert & oriented x 3 with fluent speech NEURO: no focal motor/sensory deficits   All questions were answered. The patient knows to call the clinic with any problems, questions or concerns.   The total time spent in the appointment was 40 minutes encounter with patient including review of chart and various tests results, discussions about plan of care and coordination of care plan  David Bills, NP 07/09/2023 11:00 AM    Labs Reviewed:  Lab Results  Component Value Date   WBC 13.5 (H) 07/09/2023   HGB 8.8 (L) 07/09/2023   HCT 28.3 (L) 07/09/2023   MCV 101.1 (H) 07/09/2023   PLT 15 (LL) 07/09/2023   Recent Labs    07/02/23 0330 07/03/23 0356 07/03/23 1244 07/04/23 0425 07/07/23 0349 07/08/23 0321 07/09/23 0259  NA 126*   < >  --    < > 130* 127* 130*  K 4.1   < >  --    < > 4.0 4.2 4.3  CL 98   < >  --    < > 99 96* 99  CO2 21*   < >  --    < > 23 22 24   GLUCOSE 118*   < >  --    < > 197* 164* 156*  BUN 14   < >  --    < > 21* 22* 22*  CREATININE 0.53*   < >  --    < > 0.70 0.60* 0.58*  CALCIUM 7.4*   < >  --    < > 7.2* 7.4* 7.5*  GFRNONAA >60   < >  --    < > >60 >60 >60  PROT 4.9*   < >  --    < > 5.0* 4.6* 4.6*  ALBUMIN 1.6*   < >  --    < > 1.6* 1.7* 1.7*  AST 63*   < >  --    < > 102* 106* 141*  ALT 21   < >  --    < > 42 41 44  ALKPHOS 465*   < >  --    < > 610* 835* 1,093*  BILITOT 4.9*   < > 5.4*   < > 3.2* 2.8* 3.2*  BILIDIR 2.9*  --  3.4*  --   --   --   --   IBILI  --   --  2.0*  --   --   --   --    < > = values in this interval not  displayed.    Studies Reviewed:  US ABDOMEN LIMITED RUQ (LIVER/GB) Result Date: 07/03/2023 CLINICAL DATA:  Transaminitis EXAM: ULTRASOUND  ABDOMEN LIMITED RIGHT UPPER QUADRANT COMPARISON:  CT 06/28/2023 and older FINDINGS: Gallbladder: Distended gallbladder with sludge and stones. Borderline wall thickening of 3 mm. No sonographic Murphy's sign reported. Trace adjacent fluid suggested. Common bile duct: Diameter: 3 mm Liver: No focal lesion identified. Within normal limits in parenchymal echogenicity. Portal vein is patent on color Doppler imaging with normal direction of blood flow towards the liver. Other: Right pleural effusion. IMPRESSION: Distended gallbladder with sludge and stones. Borderline wall thickening. Question trace adjacent fluid. No ductal dilatation. There is further concern of acute cholecystitis a HIDA scan could be considered. Right pleural effusion. Electronically Signed   By: Karen Kays M.D.   On: 07/03/2023 16:02   CT ANGIO GI BLEED Result Date: 06/28/2023 CLINICAL DATA:  Lower GI bleeding. Mesenteric ischemia, acute. Metastatic colon cancer EXAM: CTA ABDOMEN AND PELVIS WITHOUT AND WITH CONTRAST TECHNIQUE: Multidetector CT imaging of the abdomen and pelvis was performed using the standard protocol during bolus administration of intravenous contrast. Multiplanar reconstructed images and MIPs were obtained and reviewed to evaluate the vascular anatomy. RADIATION DOSE REDUCTION: This exam was performed according to the departmental dose-optimization program which includes automated exposure control, adjustment of the mA and/or kV according to patient size and/or use of iterative reconstruction technique. CONTRAST:  80mL OMNIPAQUE IOHEXOL 350 MG/ML SOLN COMPARISON:  06/04/2023 FINDINGS: VASCULAR Aorta: Normal caliber aorta without aneurysm, dissection, vasculitis or significant stenosis. Mild atherosclerosis. Celiac: Patent without evidence of aneurysm, dissection, vasculitis or  significant stenosis. SMA: Patent without evidence of aneurysm, dissection, vasculitis or significant stenosis. Renals: The right renal artery is patent without evidence of aneurysm, dissection, vasculitis, fibromuscular dysplasia or significant stenosis. Patient has had a left nephrectomy. IMA: The origin and proximal aspect of the inferior mesenteric artery is not identified. This does appear to be reconstituted by branches of the superior mesenteric artery. Inflow: Patent without evidence of aneurysm, dissection, vasculitis or significant stenosis. Proximal Outflow: Bilateral common femoral and visualized portions of the superficial and profunda femoral arteries are patent without evidence of aneurysm, dissection, vasculitis or significant stenosis. Veins: Major venous structures are patent. Review of the MIP images confirms the above findings. NON-VASCULAR Lower chest: Small layering left pleural effusion. Hepatobiliary: 1.1 cm hypoattenuating lesion in the periphery of the right lower lobe, not definitively seen on the prior study (series 18, image 29. Previously described subtly hypoattenuating lesions within the liver at the gallbladder fossa and IVC are difficult to characterize but do not appear appreciably changed. Atrophy of the left hepatic lobe. Right hepatic lobe measures 22 cm in length. Small stone within the gallbladder. No biliary dilatation. Pancreas: Grossly unremarkable. Spleen: Grossly unchanged appearance of the spleen with multiple heterogeneously areas of hypoattenuation including dominant lesion measuring approximately 11.2 x 8.5 cm. 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 Adrenals/Urinary Tract: Thickened appearance of the adrenal glands. Prior left nephrectomy. Unremarkable right kidney. No stone or hydronephrosis. Unremarkable urinary bladder. Stomach/Bowel: Tiny hiatal hernia. Stomach otherwise within normal limits. No abnormally dilated loops of bowel.  Postsurgical changes from prior sigmoid resection and reanastomosis abnormal colonic wall thickening in the region of the distal sigmoid colon (series 18, image 66). There is also focal colonic wall thickening just distal to the anastomotic site (series 18, image 57). Normal appendix in the right lower quadrant (series 18, image 53). No evidence of abnormal contrast accumulation within the bowel on multiphasic imaging to localize site of active gastrointestinal bleeding. Lymphatic: Mildly prominent left external iliac chain lymph node  measuring 9 mm (series 18, image 59), unchanged. No new or enlarging abdominopelvic lymph nodes. Reproductive: Prostatomegaly. Other: No ascites or pneumoperitoneum. Musculoskeletal: Extensive, widespread osseous metastatic disease, without evidence of significant progression compared to the prior study. Scoliotic spinal curvature. No pathologic fractures are seen. IMPRESSION: 1. The origin and proximal aspect of the inferior mesenteric artery is not identified. This does appear to be reconstituted by branches of the superior mesenteric artery. Otherwise, no significant vascular abnormality. 2. No evidence of abnormal contrast accumulation within the bowel to localize site of active gastrointestinal bleeding. 3. Postsurgical changes from prior sigmoid resection and reanastomosis. Abnormal colonic wall thickening in the region of the distal sigmoid colon and just distal to the anastomotic site. Findings may represent sites of focal colitis, however recurrent malignancy is not excluded. Follow-up colonoscopy should be considered following the resolution of patient's acute symptoms. 4. New 1.1 cm hypoattenuating lesion in the periphery of the right hepatic lobe, not definitively seen on the prior study. This is concerning for a new site of metastatic disease. 5. Extensive, widespread osseous metastatic disease, without evidence of significant progression compared to the prior study. 6.  Small layering left pleural effusion. 7. Cholelithiasis. 8. Aortic atherosclerosis. Electronically Signed   By: Duanne Guess D.O.   On: 06/28/2023 11:18  Mr. Matulich was interviewed and examined.  He reports diffuse increased pain today.  The pain has not been relieved with oral Dilaudid. He has persistent anemia and severe thrombocytopenia.  No bleeding other than a left conjunctival hemorrhage.  I discussed the prognosis and treatment options with Mr. Shepherd.  He understands systemic treatment options for the cancer are very limited, especially with the severe thrombocytopenia.  The thrombocytopenia is very likely related to bone marrow involvement by metastatic colon cancer.  I reviewed the peripheral blood smear yesterday.  We we discussed the plan for an inpatient rehab stay.  He does not appear to be a candidate for rehab given the severe pain and thrombocytopenia.  We discussed hospice care.  I explained his survival could be limited 2 weeks or a few months.  I discussed the situation with Ms. Kerney at approximately 1:45 PM.  She understands the poor prognosis and expressed her desire to increase his comfort and get him home.  I recommend hospice care.  She is in agreement.  She is concerned the abdominal pain may be related to cholecystitis.  She would like the medical team to consider resuming antibiotics.  Recommendations: Palliative care consult for pain management Refer to [facility] for home hospice

## 2023-07-09 NOTE — H&P (Incomplete)
 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 [name].  Per chart review patient lives with spouse.  Two-level home bed and bath main level with 2 steps to entry.  Independent prior to admission with occasional rolling walker.  Wife works during the day.  Patient with recent admission 06/03/2023 - 06/06/2023 for gluteal fold abscess/cellulitis and was discharged to home on Augmentin.  Presented to [facility] 06/28/2023 with acute onset of bright red blood per rectum.  Denied any nausea vomiting or abdominal pain.  Admission hemoglobin 6.5 and he was transfused 2 units of packed red blood cells as well as remaining chemistry showing a sodium of 129 CO2 20 glucose 251 alkaline phosphatase 676.  Patient does endorse taking 6-8 Advil per day for years for bone pain.  Gastroenterology services Dr. Bosie Clos consulted with CT angiogram negative for active bleeding but showed abnormal colonic wall thickening distal to colocolonic anastomosis in the distal sigmoid colon.  His Pradaxa was placed on hold.  Underwent colonoscopy 06/30/2023 per Dr. Dulce Sellar that showed stool in the sigmoid colon, in the descending colon and at the splenic flexure.  Mucosal ulceration in the distal descending colon.  Visible vessel with oozing, endoscopically treated.  EGD showed grade B  esophagitis and congested duodenal mucosa but no other signs of bleeding.  His Pradaxa remains on hold at this time for approximately 1 week or longer given thrombocytopenia discussed possibly resume his outpatient or when cleared by oncology.Marland Kitchen    Hospital course patient continues to be followed by oncology services and patient currently remains full code and extensive discussions have been held with patient and family in regards to his cancer with mets as well as hospice has been offered to patient currently declined.  Hyperbilirubinemia/transaminitis initially concerned about hemolysis with bone marrow involvement of metastatic disease, status post IA RUQ that showed distended gallbladder with sludge and stones borderline wall thickening surgery was consulted placed on IV antibiotics and HIDA scan ordered due to rule out acute cholecystitis patient needing continuous narcotics along with scheduled MS Contin, HIDA scan will likely be false extensively discussed with patient oncologist, surgery and also with the patient given difficult situation and at this time decided to continue antibiotics (intravenous Rocephin completed and currently remains on Flagyl) supportive care management HIDA scan discontinued per [facility] as it was felt patient poor surgical candidate for cholecystectomy as well as with thrombocytopenia also not an ideal candidate for cholecystostomy drain..  Patient did experience some diminished hearing sensation of the left ear/fuzziness/ringing with recent MRI of the brain with and without contrast showing 2 punctate foci of enhancement on the left parietal occipital region possibly metastasis no acute findings.  Discussed with oncology continue symptomatic management.  Therapy evaluations completed due to patient's decreased functional mobility was admitted for a comprehensive rehab program  Review of Systems  Constitutional:  Negative for chills and fever.  HENT:  Negative for hearing loss.    Eyes:  Negative for blurred vision and double vision.  Respiratory:  Negative for cough, shortness of breath and wheezing.   Cardiovascular:  Positive for leg swelling. Negative for chest pain and palpitations.  Gastrointestinal:  Positive for blood in stool and melena. Negative for heartburn and vomiting.  Genitourinary:  Negative for dysuria, flank pain and hematuria.  Musculoskeletal:  Positive for back pain, joint pain and myalgias.  Skin:  Negative for rash.  Neurological:  Positive for weakness.  All other systems reviewed and are negative.  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   History of hiatal hernia    Hyperlipidemia    Hypertension    Diagnostic exercise tolerance test assessment:04/30/2010 : comments normal -no evidence os ischemia by ST analysis   lipoma    Lipoma 01/26/2019   Mitral regurgitation    Echo 12/2018: EF 60-65, myxomatous mitral valve, severe mitral regurgitation, partial flail leaflet of posterior mitral valve, myxomatous tricuspid valve with trivial TR, ascending aorta mildly dilated (39 mm)   Pneumonia    as child   PONV (postoperative nausea and vomiting)    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   Complex valvuloplasty including artificial Gore-tex neochord placement x6 with 28 mm Sorin Memo 4D ring annuloplasty via right mini thoracotomy approach   Solitary kidney    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   Past Surgical History:  Procedure  Laterality Date   ABDOMINAL AORTOGRAM N/A 02/10/2020   Procedure: ABDOMINAL AORTOGRAM;  Surgeon: Corky Crafts, MD;  Location: [facility] INVASIVE CV LAB;  Service: Cardiovascular;  Laterality: N/A;   APPLICATION OF WOUND VAC  08/15/2021   Procedure: APPLICATION OF WOUND VAC;  Surgeon: Emelia Loron, MD;  Location: [facility] OR;  Service: General;;   BIOPSY  01/07/2019   Procedure: BIOPSY;  Surgeon: Charlott Rakes, MD;  Location: [facility] ENDOSCOPY;  Service: Endoscopy;;   BIOPSY  01/03/2020   Procedure: BIOPSY;  Surgeon: Charlott Rakes, MD;  Location: [facility] ENDOS[facility];  Service: Endoscopy;;   COLON SURGERY  03/04/2019   left colon segmental resection   COLONOSCOPY Left 06/30/2023   Procedure: COLONOSCOPY;  Surgeon: Willis Modena, MD;  Location: [facility] ENDOSCOPY;  Service: Gastroenterology;  Laterality: Left;   COLONOSCOPY WITH PROPOFOL N/A 01/07/2019   Procedure: COLONOSCOPY WITH PROPOFOL;  Surgeon: Charlott Rakes, MD;  Location: [facility] ENDOSCOPY;  Service: Endoscopy;  Laterality: N/A;   COLONOSCOPY WITH PROPOFOL N/A 01/03/2020   Procedure: COLONOSCOPY WITH PROPOFOL;  Surgeon: Charlott Rakes, MD;  Location: [facility]OS[facility];  Service: Endoscopy;  Laterality: N/A;   ESOPHAGOGASTRODUODENOSCOPY Left 06/30/2023   Procedure: EGD (ESOPHAGOGASTRODUODENOSCOPY);  Surgeon: Willis Modena, MD;  Location: [facility]COPY;  Service: Gastroenterology;  Laterality: Left;   HEMOSTASIS CLIP PLACEMENT  06/30/2023   Procedure: CONTROL OF HEMORRHAGE, GI TRACT, ENDOSCOPIC, BY CLIPPING OR OVERSEWING;  Surgeon: Willis Modena, MD;  Location: [facility];  Service: Gastroenterology;;   INCISION  AND DRAINAGE OF WOUND Right 08/13/2021   Procedure: IRRIGATION AND DEBRIDEMENT WOUND RIGHT GROIN;  Surgeon: Axel Filler, MD;  Location: [facility] OR;  Service: General;  Laterality: Right;   INGUINAL HERNIA REPAIR Left 04/23/2016   Procedure: LAPAROSCOPIC  INGUINAL REPAIR;  Surgeon: Berna Bue, MD;  Location: [facility] OR;  Service: General;   Laterality: Left;   IR IMAGING GUIDED PORT INSERTION  05/08/2022   IRRIGATION AND DEBRIDEMENT ABSCESS Right 08/15/2021   Procedure: IRRIGATION AND DEBRIDEMENT OF GROIN;  Surgeon: Emelia Loron, MD;  Location: [facility] OR;  Service: General;  Laterality: Right;   IRRIGATION AND DEBRIDEMENT SEBACEOUS CYST N/A 03/19/2023   Procedure: DEBRIDEMENT OF CHRONIC INFECTION;  Surgeon: Romie Levee, MD;  Location: [facility];  Service: General;  Laterality: N/A;   KNEE SURGERY     arthroscopic left knee   LIPOMA EXCISION  2015   x20 [facility]   MITRAL VALVE REPAIR Right 05/09/2020   Procedure: MINIMALLY INVASIVE MITRAL VALVE REPAIR (MVR) USING LIVANOVA MEMO 4D MITRAL RING;  Surgeon: Purcell Nails, MD;  Location: [facility] OR;  Service: Open Heart Surgery;  Laterality: Right;   POLYPECTOMY  01/03/2020   Procedure: POLYPECTOMY;  Surgeon: Charlott Rakes, MD;  Location: [facility] ENDOS[facility];  Service: Endoscopy;;   PORT-A-CATH REMOVAL     PORTACATH PLACEMENT N/A 03/16/2019   Procedure: INSERTION PORT-A-CATH;  Surgeon: Karie Soda, MD;  Location: [facility] ORS;  Service: General;  Laterality: N/A;   RIGHT/LEFT HEART CATH AND CORONARY ANGIOGRAPHY N/A 02/10/2020   Procedure: RIGHT/LEFT HEART CATH AND CORONARY ANGIOGRAPHY;  Surgeon: Corky Crafts, MD;  Location: [facility] INVASIVE CV LAB;  Service: Cardiovascular;  Laterality: N/A;   SCLEROTHERAPY  06/30/2023   Procedure: SCLEROTHERAPY;  Surgeon: Willis Modena, MD;  Location: [facility]OSCOPY;  Service: Gastroenterology;;   SUBMUCOSAL INJECTION  01/07/2019   Procedure: SUBMUCOSAL INJECTION;  Surgeon: Charlott Rakes, MD;  Location: [facility];  Service: Endoscopy;;   TEE WITHOUT CARDIOVERSION N/A 02/10/2020   Procedure: TRANSESOPHAGEAL ECHOCARDIOGRAM (TEE);  Surgeon: Elease Hashimoto Deloris Ping, MD;  Location: [facility] ENDOSCOPY;  Service: Cardiovascular;  Laterality: N/A;  CATH AFTER TEE   TEE WITHOUT CARDIOVERSION N/A 05/09/2020   Procedure: TRANSESOPHAGEAL ECHOCARDIOGRAM (TEE);   Surgeon: Purcell Nails, MD;  Location: [facility] OR;  Service: Open Heart Surgery;  Laterality: N/A;   THYROIDECTOMY, PARTIAL  01/05/2009   Right Thyroid Lobectomy   TONSILLECTOMY     TOTAL NEPHRECTOMY Left 1970   Wilms Tumor left kidney excised as an infant   Family History  Problem Relation Age of Onset   Heart disease Father    Hernia Father    Healthy Sister    Stroke Paternal Grandmother    Cancer Mother        THYROID   Hypotension Mother    Heart attack Maternal Grandfather    Congestive Heart Failure Maternal Grandfather    Heart attack Paternal Grandfather    Colon cancer Maternal Aunt 79   Congestive Heart Failure Paternal Uncle    Congestive Heart Failure Maternal Grandmother    Colon cancer Other 55       MGMs brother   Colon cancer Other        MGFs brother   Social History:  reports that he has never smoked. He has never used smokeless tobacco. He reports that he does not drink alcohol and does not use drugs. Allergies:  Allergies  Allergen Reactions   Cat Dander Anaphylaxis   Empagliflozin Other (See Comments)    Recurrent  UTI   Medications Prior to Admission  Medication Sig Dispense Refill   acetaminophen (TYLENOL) 500 MG tablet Take 1 tablet (500 mg total) by mouth daily as needed for mild pain. 30 tablet 0   albuterol (PROVENTIL HFA;VENTOLIN HFA) 108 (90 BASE) MCG/ACT inhaler Inhale 1-2 puffs into the lungs every 6 (six) hours as needed for wheezing or shortness of breath.     aspirin EC 81 MG EC tablet Take 1 tablet (81 mg total) by mouth daily. Swallow whole. 30 tablet 11   atorvastatin (LIPITOR) 10 MG tablet Take 1 tablet (10 mg total) by mouth daily. 30 tablet 1   calcium carbonate (TUMS EX) 750 MG chewable tablet Chew 1,500 mg by mouth daily as needed for heartburn.     dabigatran (PRADAXA) 150 MG CAPS capsule Take 150 mg by mouth 2 (two) times daily.     HYDROmorphone (DILAUDID) 4 MG tablet Take 1 tablet (4 mg total) by mouth every 4 (four) hours as  needed for severe pain (pain score 7-10). 60 tablet 0   ibuprofen (ADVIL) 200 MG tablet Take 200 mg by mouth daily as needed for mild pain.     insulin glargine, 1 Unit Dial, (TOUJEO SOLOSTAR) 300 UNIT/ML Solostar Pen Inject 41 Units into the skin daily.     lidocaine-prilocaine (EMLA) cream Apply 1 Application topically as needed (port irritation).     lisinopril (ZESTRIL) 5 MG tablet Take 5 mg by mouth daily.     loratadine (CLARITIN) 10 MG tablet Take 10 mg by mouth daily as needed for allergies.     metFORMIN (GLUCOPHAGE) 1000 MG tablet Take 1 tablet (1,000 mg total) by mouth 2 (two) times daily.     metoprolol succinate (TOPROL-XL) 25 MG 24 hr tablet Take 25 mg by mouth daily.     mometasone (ASMANEX) 220 MCG/INH inhaler Inhale 1 puff into the lungs daily.     morphine (MS CONTIN) 30 MG 12 hr tablet Take 1 tablet (30 mg total) by mouth every 12 (twelve) hours. 60 tablet 0   naloxone (NARCAN) nasal spray 4 mg/0.1 mL For use if Opoid overdose is suspected 2 each 0   Omega-3 Fatty Acids (FISH OIL PO) Take 1,000 mg by mouth 2 (two) times daily.     pioglitazone (ACTOS) 30 MG tablet Take 30 mg by mouth daily.     prochlorperazine (COMPAZINE) 10 MG tablet Take 1 tablet (10 mg total) by mouth every 6 (six) hours as needed. 30 tablet 3   repaglinide (PRANDIN) 2 MG tablet Take 2 mg by mouth 2 (two) times daily.     dexamethasone (DECADRON) 4 MG tablet Take 1 tablet (4 mg total) by mouth daily. Start on 05/15/2023 (Patient not taking: Reported on 06/28/2023) 90 tablet 0   ONE TOUCH ULTRA TEST test strip 1 each by Other route as needed (GLUCOSE).      polyethylene glycol powder (GLYCOLAX/MIRALAX) 17 GM/SCOOP powder Dissolve 17 g in liquid and take by mouth daily. (Patient not taking: Reported on 06/28/2023) 238 g 0   senna-docusate (SENOKOT-S) 8.6-50 MG tablet Take 1 tablet by mouth at bedtime. (Patient not taking: Reported on 06/28/2023) 30 tablet 0   traZODone (DESYREL) 50 MG tablet Take 1 tablet (50 mg  total) by mouth at bedtime as needed for sleep. (Patient not taking: Reported on 06/17/2023) 30 tablet 0      Home: Home Living Family/patient expects to be discharged to:: Private residence Living Arrangements: Spouse/significant other, Children (spouse and teenage son)  Available Help at Discharge: Family, Available 24 hours/day (wife is a Runner, broadcasting/film/video and can take FMLA , family and hired help is available as needed) Type of Home: House Home Access: Stairs to enter Secretary/administrator of Steps: 2 Entrance Stairs-Rails: Right Home Layout: Two level, Able to live on main level with bedroom/bathroom Alternate Level Stairs-Number of Steps: his bedroom and a full bathroom are on the main level of the home Bathroom Shower/Tub: Psychologist, counselling, Engineer, manufacturing systems: Standard Bathroom Accessibility: Yes Home Equipment: Agricultural consultant (2 wheels), BSC/3in1, Information systems manager, Medical laboratory scientific officer - single point Additional Comments: wife works 4 minutes from home  Lives With: Spouse, Son   Functional History: Prior Function Prior Level of Function : Independent/Modified Independent, Driving Mobility Comments: Prior to ~2 weeks ago, he used a RW for ambulation only when outside the home. Over the past couple weeks, he has been using a RW all the time for ambulation & his overall mobility has been limited. ADLs Comments: He has required assistance from his spouse for bathing, dressing, and toileting over the past ~2 weeks. Prior to that, he was modified independent to independent with ADLs.  Functional Status:  Mobility: Bed Mobility Overal bed mobility: Needs Assistance Bed Mobility: Sidelying to Sit Rolling: Mod assist Sidelying to sit: HOB elevated, Used rails, Min assist Supine to sit: Used rails, HOB elevated, Mod assist Sit to supine: Mod assist Sit to sidelying: Mod assist, Used rails, HOB elevated General bed mobility comments: Increased time. Cues provided. Small amount of assist  provided. Transfers Overall transfer level: Needs assistance Equipment used: Rolling walker (2 wheels) Transfers: Sit to/from Stand Sit to Stand: Min assist, +2 safety/equipment, +2 physical assistance, From elevated surface Bed to/from chair/wheelchair/BSC transfer type:: Step pivot Step pivot transfers: Min assist, +2 physical assistance, +2 safety/equipment General transfer comment: Cues for safety, technique, hand placement, pursed lip breathing. Assist to power up, stabilize, control descent. Increased time. x 2 sit to stands during session. Ambulation/Gait Ambulation/Gait assistance: Min assist, +2 safety/equipment Gait Distance (Feet): 20 Feet (x2) Assistive device: Rolling walker (2 wheels) Gait Pattern/deviations: Step-through pattern, Decreased stride length, Trunk flexed General Gait Details: Assist to stabilize pt throughout distance and for close follow with recliner. Seated reat break taken as needed. Pt tolerated distance well.    ADL: ADL Overall ADL's : Needs assistance/impaired Eating/Feeding: Sitting, Set up Grooming: Wash/dry face, Oral care, Sitting, Bed level, Set up Grooming Details (indicate cue type and reason): fpr EOB activity unilateral use of rail for stability and offloading pain to spine and hips Upper Body Dressing : Minimal assistance, Sitting Lower Body Dressing: Maximal assistance, Sitting/lateral leans Toileting- Clothing Manipulation and Hygiene: Maximal assistance Toileting - Clothing Manipulation Details (indicate cue type and reason): at bedside commode level, based on clinical judgement General ADL Comments: educated on breathing and BORG Scale for RPE qualification with EOB sitting for oral care 18/20 RPE and bed level face washing 6 RPE  Cognition: Cognition Orientation Level: Oriented X4 Cognition Arousal: Alert Behavior During Therapy: WFL for tasks assessed/performed  Physical Exam: Blood pressure (!) 154/98, pulse 91, temperature  98.5 F (36.9 C), temperature source Oral, resp. rate 18, height 5\' 11"  (1.803 m), weight 81.6 kg, SpO2 97%. Physical Exam Neurological:     Comments: Patient is alert sitting up in bed currently in no acute distress.  Oriented x 3     Results for orders placed or performed during the hospital encounter of 06/28/23 (from the past 48 hours)  Glucose, capillary  Status: Abnormal   Collection Time: 07/07/23  7:32 AM  Result Value Ref Range   Glucose-Capillary 206 (H) 70 - 99 mg/dL    Comment: Glucose reference range applies only to samples taken after fasting for at least 8 hours.  Glucose, capillary     Status: Abnormal   Collection Time: 07/07/23 11:33 AM  Result Value Ref Range   Glucose-Capillary 300 (H) 70 - 99 mg/dL    Comment: Glucose reference range applies only to samples taken after fasting for at least 8 hours.  CBC with Differential/Platelet     Status: Abnormal   Collection Time: 07/07/23 11:51 AM  Result Value Ref Range   WBC 13.2 (H) 4.0 - 10.5 K/uL   RBC 2.85 (L) 4.22 - 5.81 MIL/uL   Hemoglobin 8.9 (L) 13.0 - 17.0 g/dL   HCT 20.2 (L) 54.2 - 70.6 %   MCV 102.8 (H) 80.0 - 100.0 fL   MCH 31.2 26.0 - 34.0 pg   MCHC 30.4 30.0 - 36.0 g/dL   RDW 23.7 (H) 62.8 - 31.5 %   Platelets 20 (LL) 150 - 400 K/uL    Comment: SPECIMEN CHECKED FOR CLOTS CRITICAL VALUE NOTED.  VALUE IS CONSISTENT WITH PREVIOUSLY REPORTED AND CALLED VALUE. REPEATED TO VERIFY PLATELET COUNT CONFIRMED BY SMEAR    nRBC 6.3 (H) 0.0 - 0.2 %   Neutrophils Relative % 68 %   Neutro Abs 9.2 (H) 1.7 - 7.7 K/uL   Lymphocytes Relative 6 %   Lymphs Abs 0.7 0.7 - 4.0 K/uL   Monocytes Relative 7 %   Monocytes Absolute 0.9 0.1 - 1.0 K/uL   Eosinophils Relative 1 %   Eosinophils Absolute 0.1 0.0 - 0.5 K/uL   Basophils Relative 1 %   Basophils Absolute 0.1 0.0 - 0.1 K/uL   Smear Review PLATELETS APPEAR DECREASED    Immature Granulocytes 17 %   Abs Immature Granulocytes 2.19 (H) 0.00 - 0.07 K/uL    Schistocytes PRESENT     Comment: RARE   Tear Drop Cells PRESENT    Burr Cells PRESENT    Polychromasia PRESENT     Comment: Performed at [facility], [address] [place], [place] [address]  Glucose, capillary     Status: Abnormal   Collection Time: 07/07/23  4:37 PM  Result Value Ref Range   Glucose-Capillary 199 (H) 70 - 99 mg/dL    Comment: Glucose reference range applies only to samples taken after fasting for at least 8 hours.  Glucose, capillary     Status: Abnormal   Collection Time: 07/07/23  8:25 PM  Result Value Ref Range   Glucose-Capillary 297 (H) 70 - 99 mg/dL    Comment: Glucose reference range applies only to samples taken after fasting for at least 8 hours.  Glucose, capillary     Status: Abnormal   Collection Time: 07/07/23  9:31 PM  Result Value Ref Range   Glucose-Capillary 312 (H) 70 - 99 mg/dL    Comment: Glucose reference range applies only to samples taken after fasting for at least 8 hours.  Comprehensive metabolic panel with GFR     Status: Abnormal   Collection Time: 07/08/23  3:21 AM  Result Value Ref Range   Sodium 127 (L) 135 - 145 mmol/L   Potassium 4.2 3.5 - 5.1 mmol/L   Chloride 96 (L) 98 - 111 mmol/L   CO2 22 22 - 32 mmol/L   Glucose, Bld 164 (H) 70 - 99 mg/dL  Comment: Glucose reference range applies only to samples taken after fasting for at least 8 hours.   BUN 22 (H) 6 - 20 mg/dL   Creatinine, Ser 1.61 (L) 0.61 - 1.24 mg/dL   Calcium 7.4 (L) 8.9 - 10.3 mg/dL   Total Protein 4.6 (L) 6.5 - 8.1 g/dL   Albumin 1.7 (L) 3.5 - 5.0 g/dL   AST 096 (H) 15 - 41 U/L   ALT 41 0 - 44 U/L   Alkaline Phosphatase 835 (H) 38 - 126 U/L   Total Bilirubin 2.8 (H) 0.0 - 1.2 mg/dL   GFR, Estimated >04 >54 mL/min    Comment: (NOTE) Calculated using the CKD-EPI Creatinine Equation (2021)    Anion gap 9 5 - 15    Comment: Performed at [facility], [address], [place], [place] [address]  CBC with  Differential/Platelet     Status: Abnormal   Collection Time: 07/08/23  3:21 AM  Result Value Ref Range   WBC 13.2 (H) 4.0 - 10.5 K/uL   RBC 2.65 (L) 4.22 - 5.81 MIL/uL   Hemoglobin 8.4 (L) 13.0 - 17.0 g/dL   HCT 91.4 (L) 78.2 - 95.6 %   MCV 102.6 (H) 80.0 - 100.0 fL   MCH 31.7 26.0 - 34.0 pg   MCHC 30.9 30.0 - 36.0 g/dL   RDW 21.3 (H) 08.6 - 57.8 %   Platelets 17 (LL) 150 - 400 K/uL    Comment: Immature Platelet Fraction may be clinically indicated, consider ordering this additional test [id number] CONSISTENT WITH PREVIOUS RESULT CRITICAL VALUE NOTED.  VALUE IS CONSISTENT WITH PREVIOUSLY REPORTED AND CALLED VALUE.    nRBC 8.1 (H) 0.0 - 0.2 %   Neutrophils Relative % 72 %   Neutro Abs 10.7 (H) 1.7 - 7.7 K/uL   Band Neutrophils 9 %   Lymphocytes Relative 1 %   Lymphs Abs 0.1 (L) 0.7 - 4.0 K/uL   Monocytes Relative 5 %   Monocytes Absolute 0.7 0.1 - 1.0 K/uL   Eosinophils Relative 0 %   Eosinophils Absolute 0.0 0.0 - 0.5 K/uL   Basophils Relative 1 %   Basophils Absolute 0.1 0.0 - 0.1 K/uL   Smear Review MORPHOLOGY UNREMARKABLE     Comment: PLATELET COUNT CONFIRMED BY SMEAR   nRBC 15 (H) 0 /100 WBC   Metamyelocytes Relative 6 %   Myelocytes 6 %   Abs Immature Granulocytes 1.60 (H) 0.00 - 0.07 K/uL   Schistocytes PRESENT    Burr Cells PRESENT    Polychromasia PRESENT     Comment: Performed at [facility], [address], [place], [place] [address]  Glucose, capillary     Status: Abnormal   Collection Time: 07/08/23  7:30 AM  Result Value Ref Range   Glucose-Capillary 145 (H) 70 - 99 mg/dL    Comment: Glucose reference range applies only to samples taken after fasting for at least 8 hours.  Glucose, capillary     Status: Abnormal   Collection Time: 07/08/23 11:27 AM  Result Value Ref Range   Glucose-Capillary 265 (H) 70 - 99 mg/dL    Comment: Glucose reference range applies only to samples taken after fasting for at least 8 hours.  Glucose, capillary      Status: Abnormal   Collection Time: 07/08/23  5:48 PM  Result Value Ref Range   Glucose-Capillary 214 (H) 70 - 99 mg/dL    Comment: Glucose reference range applies only to samples taken after fasting  for at least 8 hours.  Glucose, capillary     Status: Abnormal   Collection Time: 07/08/23  8:33 PM  Result Value Ref Range   Glucose-Capillary 212 (H) 70 - 99 mg/dL    Comment: Glucose reference range applies only to samples taken after fasting for at least 8 hours.  Comprehensive metabolic panel with GFR     Status: Abnormal   Collection Time: 07/09/23  2:59 AM  Result Value Ref Range   Sodium 130 (L) 135 - 145 mmol/L   Potassium 4.3 3.5 - 5.1 mmol/L   Chloride 99 98 - 111 mmol/L   CO2 24 22 - 32 mmol/L   Glucose, Bld 156 (H) 70 - 99 mg/dL    Comment: Glucose reference range applies only to samples taken after fasting for at least 8 hours.   BUN 22 (H) 6 - 20 mg/dL   Creatinine, Ser 0.86 (L) 0.61 - 1.24 mg/dL   Calcium 7.5 (L) 8.9 - 10.3 mg/dL   Total Protein 4.6 (L) 6.5 - 8.1 g/dL   Albumin 1.7 (L) 3.5 - 5.0 g/dL   AST 578 (H) 15 - 41 U/L   ALT 44 0 - 44 U/L   Alkaline Phosphatase 1,093 (H) 38 - 126 U/L   Total Bilirubin 3.2 (H) 0.0 - 1.2 mg/dL   GFR, Estimated >46 >96 mL/min    Comment: (NOTE) Calculated using the CKD-EPI Creatinine Equation (2021)    Anion gap 7 5 - 15    Comment: Performed at [facility], [address], [place], [place] [address]  CBC with Differential/Platelet     Status: Abnormal (Preliminary result)   Collection Time: 07/09/23  2:59 AM  Result Value Ref Range   WBC 13.5 (H) 4.0 - 10.5 K/uL   RBC 2.80 (L) 4.22 - 5.81 MIL/uL   Hemoglobin 8.8 (L) 13.0 - 17.0 g/dL   HCT 41.3 (L) 24.4 - 01.0 %   MCV 101.1 (H) 80.0 - 100.0 fL   MCH 31.4 26.0 - 34.0 pg   MCHC 31.1 30.0 - 36.0 g/dL   RDW 27.2 (H) 53.6 - 64.4 %   Platelets 15 (LL) 150 - 400 K/uL    Comment: Immature Platelet Fraction may be clinically indicated, consider ordering  this additional test [id number] CRITICAL VALUE NOTED.  VALUE IS CONSISTENT WITH PREVIOUSLY REPORTED AND CALLED VALUE. REPEATED TO VERIFY    nRBC 13.0 (H) 0.0 - 0.2 %    Comment: Performed at [facility], [address], [place], [place] [address]   Neutrophils Relative % PENDING %   Neutro Abs PENDING 1.7 - 7.7 K/uL   Band Neutrophils PENDING %   Lymphocytes Relative PENDING %   Lymphs Abs PENDING 0.7 - 4.0 K/uL   Monocytes Relative PENDING %   Monocytes Absolute PENDING 0.1 - 1.0 K/uL   Eosinophils Relative PENDING %   Eosinophils Absolute PENDING 0.0 - 0.5 K/uL   Basophils Relative PENDING %   Basophils Absolute PENDING 0.0 - 0.1 K/uL   WBC Morphology PENDING    RBC Morphology PENDING    Smear Review PENDING    Other PENDING %   nRBC PENDING 0 /100 WBC   Metamyelocytes Relative PENDING %   Myelocytes PENDING %   Promyelocytes Relative PENDING %   Blasts PENDING %   Immature Granulocytes PENDING %   Abs Immature Granulocytes PENDING 0.00 - 0.07 K/uL   No results found.    Blood pressure (!) 154/98, pulse 91,  temperature 98.5 F (36.9 C), temperature source Oral, resp. rate 18, height 5\' 11"  (1.803 m), weight 81.6 kg, SpO2 97%.  Medical Problem List and Plan: 1. Functional deficits secondary to debility related to GI bleed/metastatic colon cancer.  Followed by oncology services.  Await plan for possible hospice..  Chemotherapy currently on hold.  -patient may *** shower  -ELOS/Goals: *** 2.  Antithrombotics: -DVT/anticoagulation:  Mechanical: Antiembolism stockings, thigh (TED hose) Bilateral lower extremities.  Pradaxa for history of DVT currently on hold due to GI bleed followed by oncology services on when and if to resume  -antiplatelet therapy: N/A 3. Pain Management/chronic pain.  MS Contin 100 mg every 12 hours, Dilaudid 4-8 mg every 4 hours as needed 4. Mood/Behavior/Sleep: Provide emotional support.  -antipsychotic agents: N/A 5.  Neuropsych/cognition: This patient is capable of making decisions on his own behalf. 6. Skin/Wound Care: Routine skin checks 7. Fluids/Electrolytes/Nutrition: Routine in and outs with follow-up chemistries 8.  Chronic anemia/thrombocytopenia, bone marrow suppression from chemo metastasis.  Follow-up per oncology services 9.  Hyperbilirubinemia/transaminitis distended gallbladder with sludge and stones suspect acute cholecystitis.  Not a surgical candidate.  Continue antibiotic therapy with Rocephin completed and currently remains on Flagyl. 10.  Diabetes mellitus.  Semglee 10 units daily. 11.  Diminished hearing sensation in the left ear.  Most recent MRI of the brain with and without contrast 05/25/2023 showed 2 punctate foci of enhancement on the left parietal occipital region possibly representing metastasis with no acute findings.  Continue symptomatic management 12.  Chronic hyponatremia.  Follow-up chemistries 13.  Hypertension.  Toprol-XL 25 mg daily.  Monitor with increased mobility Charlton Amor, PA-C 07/09/2023

## 2023-07-09 NOTE — Progress Notes (Incomplete Revision)
 PROGRESS NOTE [name]  ZOX:[id number] DOB: [birth year] DOA: 06/28/2023 PCP: Joycelyn Rua, MD  Brief Narrative/Hospital Course: 58 year old with history of colon cancer on FOLFOX, Avastin follows Dr [name], insulin-dependent DM2, HTN, HLD, DVT on Pradaxa admitted to the hospital for bright red blood per rectum/acute blood loss anemia.  Recently treated for gluteal fold cellulitis.  Last dose of Pradaxa was the night before his hospitalization.  Admission hemoglobin was noted to be 6.5 therefore PRBC transfusion was ordered.  CT angio was negative for active bleeding but showed colonic wall thickening distal to prior anastomosis.  Colonoscopy prep was overall inadequate stool was noted in sigmoid colon.  There was oozing distal descending colon blood vessel which was endoscopically treated.  EGD showed grade B esophagitis and congested duodenal mucosa but no other signs of bleeding.  For now GI recommended holding off on anticoagulation for at least 1 week. Overall remains hemodynamically stable. Due to complaints of decreased hearing on left ear discussed with oncology question if he has metastatic spots at base of skull-advised hospice based upon his overall functional status and metastatic disease AND patient does not feel ready yet.  Consultation: Gastroenterology [facility] Oncology  Procedures/testing: E4/1 : EGD/ COLONOSCOPY (Left)-CONTROL OF HEMORRHAGE, GI TRACT, ENDOSCOPIC, BY CLIPPING OR OVERSEWING SCLEROTHERAPY   Subjective: Seen and examined Overnight patient afebrile BP stable Labs reviewed-sodium at 113, mild leukocytosis hemoglobin stable. Plt further down to 15k Patient having lots of pain in his hip and bones and having more decreased hearing  today Rehab placement has been canceled  Assessment & Plan:  BRBPR-colorectal ABLA Acute on chronic anemia B/L hB ~8.0, due to rapid decline in his hemoglobin and multiple brbpr received total 3 units of PRBC-and HB stable  since S/P EGD colonoscopy 4/1- prep was overall inadequate,oozing distal descending colon blood vessel which was endoscopically treated.  EGD showed grade B esophagitis and congested duodenal mucosa but no other signs of bleeding.  GI recommended to hold off anticoagulation at least 1 week and gi signed off Cont PPI BID, cont to hold aspirin.  Colon cancer, metastatic Anemia and thrombocytopenia, bone marrow suppression from chemo and metastasis Severe cancer-related pain-diffusedly in bones Lower extremity weakness/debility from cancer: Patient has pain due to extensive bone metastasis and has deconditioning and weakness. Having worsening pain this morning-continue home MS Contin twice daily, Decadron, IV narcotics frequency increased Palliative care has been consulted Discussed with [name] who again has recommended hospice services at this time. Unfortunately he remains full code. His life expectancy 2wks-month per oncology and [facility] hospice recommended and TOC consulted. Continue antibiotics for now.  Acute on chronic thrombocytopenia: Plt at 15k. Continue to hold anticoagulation monitor CBC and transfuse if < 10k as per oncology   Hyperbilirubinemia/Transaminitis Distended gallbladder with sludge and stones Transaminitis likely multifactorial mostly from bone metastasis - RUQ  US showed distended gallbladder with sludge and stone, borderline wall thickening-surgery was consulted placed on IV antibiotics and HIDA scan ordered but given he is needing continuous narcotics along with scheduled MS Contin, HIDA scan will likely be false-extensively discussed with patient's oncologist, surgery-and given difficult situation at this time decided to continue antibiotics-Rocephin/Flagyl hIDA scan discontinued per [facility]. Moderately poor surgical candidate for cholecystectomy, is profoundly thrombocytopenic-and also not a ideal candidate for cholecystostomy drain if HIDA were positive.  LFTs  were slightly downtrending but today TB up at 3.2 -likely indicating metastatic disease -discussed with oncology today.   Diminished hearing sensation b/l ,initially on left/fuzziness/ringing sensation: Continue eardrops.  Having worsening of his diminished hearing bilaterally on 4/10 and on discussion oncology likely from his metastatic disease.  He is moving all extremities well nonfocal. Having some ringing sensation. MRI brain with and without contrast on 05/25/23 had 2 punctate foci of enhancement on the left parietal occipital region could be metastasis.   IDDM type 2 with uncontrolled hyperglycemia: At home on insulin glargine 41 u daily, metformin 1000 twice daily and on hold.  Cont semglee at 10 units and ssi   Hypocalcemia Corrected calcium is stable due to hypoalbuminemia  Hypomagnesemia: Replaced   Hyponatremia: Mildly low stable  in 130s.   Hyperlipidemia Cont to hold Lipitor 2/2 transaminitis.  Essential hypertension BP controlled on Toprol-XL 25 mg- cont w/ holding parameters. Lisinopril on hold.  History of DVT Pradaxa remains on hold due to GI bleeding- to hold x 1 week- but given thrombocytopenia continue to hold for now  Goals of care/CODE STATUS: Extensively discussed goals of care and encourage DNR/DNI after discussing his case with [name] Unfortunately he remains full code for now. Dr Truett Perna extensively discussed about hospice at this point of time but patient ad his wife not electing to enter hospice. Today patient seems to be inclined towards keeping him comfortable-I have encouraged him to discuss with his wife. Very difficult situation and patient having significant bone pain/diffuse pain.  He is not a candidate for CIR He is a candidate for hospice Palliative care has been consulted ASAP [place] visiting as well patient remains high risk for decompensation, and readmission  DVT prophylaxis: SCDs Start: 06/28/23 1229 Code Status:   Code Status:  Full Code Family Communication: plan of care discussed with patient and his family at bedside, wife on the way to the hospital. Patient status is: Remains hospitalized because of severity of illness Level of care: Med-Surg   Dispo: The patient is from: Home w/ wife and kids.            Anticipated disposition: CIR  once available   Objective: Vitals last 24 hrs: Vitals:   07/08/23 2032 07/09/23 0519 07/09/23 0951 07/09/23 1000  BP: (!) 154/98 (!) 161/94  (!) 161/93  Pulse: 91 (!) 103  (!) 118  Resp: 18 20  20   Temp: 98.5 F (36.9 C) 98.2 F (36.8 C)  98.8 F (37.1 C)  TempSrc: Oral Oral  Oral  SpO2: 97% 96% 95% 96%  Weight:      Height:       Weight change:   Physical Examination: General exam: alert awake, anxious and in pain HEENT:Oral mucosa moist, Ear/Nose WNL grossly Respiratory system: Bilaterally clear BS,no use of accessory muscle Cardiovascular system: S1 & S2 +, No JVD. Gastrointestinal system: Abdomen soft, mild diffuse tenderness,ND, BS+ Nervous System: Alert, awake, decreased hearing in both ears, moving all extremities,and following commands. Extremities: LE edema neg,distal peripheral pulses palpable and warm.  Skin: No rashes, ++ icterus. MSK: Normal muscle bulk,tone, power   Medications reviewed:  Scheduled Meds:  sodium chloride   Intravenous Once   budesonide  0.25 mg Nebulization BID   carbamide peroxide  5 drop Left EAR BID   Chlorhexidine Gluconate Cloth  6 each Topical Daily   ciprofloxacin-hydrocortisone  3 drop Left EAR BID   dexamethasone  4 mg Oral Daily   insulin aspart  0-15 Units Subcutaneous TID WC & HS   insulin glargine-yfgn  10 Units Subcutaneous Daily   loratadine  10 mg Oral Daily   metoprolol succinate  25 mg Oral  Daily   morphine  100 mg Oral Q12H   pantoprazole (PROTONIX) IV  40 mg Intravenous Q12H   senna-docusate  1 tablet Oral BID  Continuous Infusions:  metronidazole 500 mg (07/09/23 1008)     Diet Order              Diet regular Room service appropriate? Yes; Fluid consistency: Thin  Diet effective now                  Intake/Output Summary (Last 24 hours) at 07/09/2023 1132 Last data filed at 07/09/2023 1100 Gross per 24 hour  Intake 237 ml  Output 300 ml  Net -63 ml   Net IO Since Admission: 6,265.77 mL [07/09/23 1132]  Wt Readings from Last 3 Encounters:  06/28/23 81.6 kg  06/17/23 81.6 kg  06/10/23 85 kg     Unresulted Labs (From admission, onward)     Start     Ordered   07/07/23 1006  CBC with Differential/Platelet  Daily,   R     Question:  Specimen collection method  Answer:  IV Team=IV Team collect   07/07/23 1005   Signed and Held  Comprehensive metabolic panel  Tomorrow morning,   R       Question:  Specimen collection method  Answer:  IV Team=IV Team collect   Signed and Held   Signed and Held  CBC with Differential/Platelet  Tomorrow morning,   R       Question:  Specimen collection method  Answer:  IV Team=IV Team collect   Signed and Held          Data Reviewed: I have personally reviewed following labs and imaging studies ( see epic result tab) CBC: Recent Labs  Lab 07/05/23 0348 07/06/23 0348 07/07/23 1151 07/08/23 0321 07/09/23 0259  WBC 9.2 10.5 13.2* 13.2* 13.5*  NEUTROABS  --   --  9.2* 10.7* 11.9*  HGB 8.6* 9.0* 8.9* 8.4* 8.8*  HCT 28.3* 28.7* 29.3* 27.2* 28.3*  MCV 100.0 99.7 102.8* 102.6* 101.1*  PLT 21* 20* 20* 17* 15*   CMP: Recent Labs  Lab 07/03/23 0356 07/04/23 0425 07/05/23 0348 07/06/23 0348 07/07/23 0349 07/08/23 0321 07/09/23 0259  NA 128* 131* 131* 130* 130* 127* 130*  K 4.1 4.1 4.0 3.8 4.0 4.2 4.3  CL 97* 98 98 98 99 96* 99  CO2 23 22 23 22 23 22 24   GLUCOSE 214* 128* 126* 95 197* 164* 156*  BUN 17 17 16 18  21* 22* 22*  CREATININE 0.41* 0.48* 0.43* 0.51* 0.70 0.60* 0.58*  CALCIUM 7.5* 7.7* 7.4* 7.4* 7.2* 7.4* 7.5*  MG 1.8 1.7 1.6* 1.6*  --   --   --    GFR: Estimated Creatinine Clearance: 109.8 mL/min (A) (by C-G formula  based on SCr of 0.58 mg/dL (L)). Recent Labs  Lab 07/05/23 0348 07/06/23 0348 07/07/23 0349 07/08/23 0321 07/09/23 0259  AST 113* 119* 102* 106* 141*  ALT 42 45* 42 41 44  ALKPHOS 571* 648* 610* 835* 1,093*  BILITOT 3.9* 3.4* 3.2* 2.8* 3.2*  PROT 4.9* 5.0* 5.0* 4.6* 4.6*  ALBUMIN 1.5* 1.7* 1.6* 1.7* 1.7*   Recent Labs  Lab 07/08/23 0730 07/08/23 1127 07/08/23 1748 07/08/23 2033 07/09/23 0806  GLUCAP 145* 265* 214* 212* 176*    Antimicrobials/Microbiology: Anti-infectives (From admission, onward)    Start     Dose/Rate Route Frequency Ordered Stop   07/04/23 1100  cefTRIAXone (ROCEPHIN) 2 g in sodium chloride 0.9 %  100 mL IVPB        2 g 200 mL/hr over 30 Minutes Intravenous Every 24 hours 07/04/23 1027 07/08/23 1338   07/04/23 1100  metroNIDAZOLE (FLAGYL) IVPB 500 mg        500 mg 100 mL/hr over 60 Minutes Intravenous Every 12 hours 07/04/23 1027           Component Value Date/Time   SDES ABSCESS 08/13/2021 1939   SPECREQUEST RIGHT GROIN ABSCESS 08/13/2021 1939   CULT  08/13/2021 1939    FEW ARCANOBACTERIUM HAEMOLYTICUM Standardized susceptibility testing for this organism is not available. MIXED ANAEROBIC FLORA PRESENT.  CALL LAB IF FURTHER IID REQUIRED.    REPTSTATUS 08/18/2021 FINAL 08/13/2021 1939     Radiology Studies: No results found.    LOS: 11 days   Total time spent in review of labs and imaging, patient evaluation, formulation of plan, documentation and communication with patient/family:  50 minutes  Lanae Boast, MD [facility] 07/09/2023, 11:32 AM

## 2023-07-09 NOTE — Plan of Care (Signed)

## 2023-07-09 NOTE — Progress Notes (Signed)
 Inpatient Rehabilitation Admissions Coordinator   Per discussion with Dr Truett Perna and Dr Dayna Barker, I will hold CIR admit today pending further discussions with patient and wife for goals of care.  Ottie Glazier, RN, MSN Rehab Admissions Coordinator [phone] 07/09/2023 8:57 AM

## 2023-07-09 NOTE — TOC Progression Note (Signed)
 Transition of Care [facility]) - Progression Note    Patient Details  Name: [name] MRN: [id number] Date of Birth: 03/16/1966  Transition of [facility]) CM/SW Contact  Larrie Kass, LCSW Phone Number: 07/09/2023, 2:46 PM  Clinical Narrative:    CSW received a consult for a referral to [facility] for home hospice services. CSW spoke with the pt's spouse to confirm their choice. Spouse inquired about the services that hospice would provide. CSW stated that she would need to send the referral for the Liaison to discuss the services. Pt's spouse expressed concerns about the pt going home with hospice and has declined the services. She stated that she would like to speak with palliative care before making a decision. Per chart review, palliative care has been consulted to discuss GOC. TOC to follow.  Expected Discharge Plan:  (TBD) Barriers to Discharge: Continued Medical Work up  Expected Discharge Plan and Services                                               Social Determinants of Health (SDOH) Interventions SDOH Screenings   Food Insecurity: No Food Insecurity (06/28/2023)  Housing: Low Risk  (06/28/2023)  Transportation Needs: No Transportation Needs (06/28/2023)  Utilities: Not At Risk (06/28/2023)  Alcohol Screen: Low Risk  (04/28/2023)  Depression (PHQ2-9): Low Risk  (04/28/2023)  Social Connections: Socially Integrated (06/03/2023)  Tobacco Use: Low Risk  (06/28/2023)    Readmission Risk Interventions    06/29/2023   10:35 AM 05/27/2023   12:04 PM  Readmission Risk Prevention Plan  Transportation Screening Complete Complete  PCP or Specialist Appt within 5-7 Days  Complete  Home Care Screening  Complete  Medication Review (RN CM)  Complete  HRI or Home Care Consult Complete   Social Work Consult for Recovery Care Planning/Counseling Complete   Palliative Care Screening Not Applicable   Medication Review Oceanographer) Complete

## 2023-07-09 NOTE — Progress Notes (Signed)
 PROGRESS NOTE [name]  ZOX:[id number] DOB: 02-03-1966 DOA: 06/28/2023 PCP: Joycelyn Rua, MD  Brief Narrative/Hospital Course: 58 year old with history of colon cancer on FOLFOX, Avastin follows Dr [name], insulin-dependent DM2, HTN, HLD, DVT on Pradaxa admitted to the hospital for bright red blood per rectum/acute blood loss anemia.  Recently treated for gluteal fold cellulitis.  Last dose of Pradaxa was the night before his hospitalization.  Admission hemoglobin was noted to be 6.5 therefore PRBC transfusion was ordered.  CT angio was negative for active bleeding but showed colonic wall thickening distal to prior anastomosis.  Colonoscopy prep was overall inadequate stool was noted in sigmoid colon.  There was oozing distal descending colon blood vessel which was endoscopically treated.  EGD showed grade B esophagitis and congested duodenal mucosa but no other signs of bleeding.  For now GI recommended holding off on anticoagulation for at least 1 week. Overall remains hemodynamically stable. Due to complaints of decreased hearing on left ear discussed with oncology question if he has metastatic spots at base of skull-advised hospice based upon his overall functional status and metastatic disease AND patient does not feel ready yet.  Consultation: Gastroenterology [facility]  Procedures/testing: E4/1 : EGD/ COLONOSCOPY (Left)-CONTROL OF HEMORRHAGE, GI TRACT, ENDOSCOPIC, BY CLIPPING OR OVERSEWING SCLEROTHERAPY   Subjective: Seen and examined Overnight patient afebrile BP stable Labs reviewed-sodium at 113, mild leukocytosis hemoglobin stable. Plt further down to 15k Patient having lots of pain in his hip and bones and having more decreased hearing  today Rehab placement has been canceled  Assessment & Plan:  BRBPR-colorectal ABLA Acute on chronic anemia B/L hB ~8.0, due to rapid decline in his hemoglobin and multiple brbpr received total 3 units of PRBC-and HB stable  since S/P EGD colonoscopy 4/1- prep was overall inadequate,oozing distal descending colon blood vessel which was endoscopically treated.  EGD showed grade B esophagitis and congested duodenal mucosa but no other signs of bleeding.  GI recommended to hold off anticoagulation at least 1 week and gi signed off Cont PPI BID, cont to hold aspirin.  Colon cancer, metastatic Anemia and thrombocytopenia, bone marrow suppression from chemo and metastasis Severe cancer-related pain-diffusedly in bones Lower extremity weakness/debility from cancer: Patient has pain due to extensive bone metastasis and has deconditioning and weakness. Having worsening pain this morning-continue home MS Contin twice daily, Decadron, IV narcotics frequency increased Palliative care has been consulted Discussed with [name] who again has recommended hospice services at this time. Unfortunately he remains full code. His life expectancy 2wks-month per oncology and [facility] hospice recommended and TOC consulted. Continue antibiotics for now.  Acute on chronic thrombocytopenia: Plt at 15k. Continue to hold anticoagulation monitor CBC and transfuse if < 10k as per oncology   Hyperbilirubinemia/Transaminitis Distended gallbladder with sludge and stones Transaminitis likely multifactorial mostly from bone metastasis - RUQ  US showed distended gallbladder with sludge and stone, borderline wall thickening-surgery was consulted placed on IV antibiotics and HIDA scan ordered but given he is needing continuous narcotics along with scheduled MS Contin, HIDA scan will likely be false-extensively discussed with patient's oncologist, surgery-and given difficult situation at this time decided to continue antibiotics-Rocephin/Flagyl hIDA scan discontinued per [facility]. Moderately poor surgical candidate for cholecystectomy, is profoundly thrombocytopenic-and also not a ideal candidate for cholecystostomy drain if HIDA were positive.  LFTs  were slightly downtrending but today TB up at 3.2 -likely indicating metastatic disease -discussed with oncology today.   Diminished hearing sensation b/l ,initially on left/fuzziness/ringing sensation: Continue eardrops.  Having worsening of his diminished hearing bilaterally on 4/10 and on discussion oncology likely from his metastatic disease.  He is moving all extremities well nonfocal. Having some ringing sensation. MRI brain with and without contrast on 05/25/23 had 2 punctate foci of enhancement on the left parietal occipital region could be metastasis.   IDDM type 2 with uncontrolled hyperglycemia: At home on insulin glargine 41 u daily, metformin 1000 twice daily and on hold.  Cont semglee at 10 units and ssi   Hypocalcemia Corrected calcium is stable due to hypoalbuminemia  Hypomagnesemia: Replaced   Hyponatremia: Mildly low stable  in 130s.   Hyperlipidemia Cont to hold Lipitor 2/2 transaminitis.  Essential hypertension BP controlled on Toprol-XL 25 mg- cont w/ holding parameters. Lisinopril on hold.  History of DVT Pradaxa remains on hold due to GI bleeding- to hold x 1 week- but given thrombocytopenia continue to hold for now  Goals of care/CODE STATUS: Extensively discussed goals of care and encourage DNR/DNI after discussing his case with [name] Unfortunately he remains full code for now. Dr Truett Perna extensively discussed about hospice at this point of time but patient ad his wife not electing to enter hospice. Today patient seems to be inclined towards keeping him comfortable-I have encouraged him to discuss with his wife. Very difficult situation and patient having significant bone pain/diffuse pain.  He is not a candidate for CIR He is a candidate for hospice Palliative care has been consulted ASAP [facility] visiting as well patient remains high risk for decompensation, and readmission  DVT prophylaxis: SCDs Start: 06/28/23 1229 Code Status:   Code Status:  Full Code Family Communication: plan of care discussed with patient and his family at bedside, wife on the way to the hospital. Patient status is: Remains hospitalized because of severity of illness Level of care: Med-Surg   Dispo: The patient is from: Home w/ wife and kids.            Anticipated disposition: CIR  once available   Objective: Vitals last 24 hrs: Vitals:   07/08/23 2032 07/09/23 0519 07/09/23 0951 07/09/23 1000  BP: (!) 154/98 (!) 161/94  (!) 161/93  Pulse: 91 (!) 103  (!) 118  Resp: 18 20  20   Temp: 98.5 F (36.9 C) 98.2 F (36.8 C)  98.8 F (37.1 C)  TempSrc: Oral Oral  Oral  SpO2: 97% 96% 95% 96%  Weight:      Height:       Weight change:   Physical Examination: General exam: alert awake, anxious and in pain HEENT:Oral mucosa moist, Ear/Nose WNL grossly Respiratory system: Bilaterally clear BS,no use of accessory muscle Cardiovascular system: S1 & S2 +, No JVD. Gastrointestinal system: Abdomen soft, mild diffuse tenderness,ND, BS+ Nervous System: Alert, awake, decreased hearing in both ears, moving all extremities,and following commands. Extremities: LE edema neg,distal peripheral pulses palpable and warm.  Skin: No rashes, ++ icterus. MSK: Normal muscle bulk,tone, power   Medications reviewed:  Scheduled Meds:  sodium chloride   Intravenous Once   budesonide  0.25 mg Nebulization BID   carbamide peroxide  5 drop Left EAR BID   Chlorhexidine Gluconate Cloth  6 each Topical Daily   ciprofloxacin-hydrocortisone  3 drop Left EAR BID   dexamethasone  4 mg Oral Daily   insulin aspart  0-15 Units Subcutaneous TID WC & HS   insulin glargine-yfgn  10 Units Subcutaneous Daily   loratadine  10 mg Oral Daily   metoprolol succinate  25 mg Oral  Daily   morphine  100 mg Oral Q12H   pantoprazole (PROTONIX) IV  40 mg Intravenous Q12H   senna-docusate  1 tablet Oral BID  Continuous Infusions:  metronidazole 500 mg (07/09/23 1008)     Diet Order              Diet regular Room service appropriate? Yes; Fluid consistency: Thin  Diet effective now                  Intake/Output Summary (Last 24 hours) at 07/09/2023 1132 Last data filed at 07/09/2023 1100 Gross per 24 hour  Intake 237 ml  Output 300 ml  Net -63 ml   Net IO Since Admission: 6,265.77 mL [07/09/23 1132]  Wt Readings from Last 3 Encounters:  06/28/23 81.6 kg  06/17/23 81.6 kg  06/10/23 85 kg     Unresulted Labs (From admission, onward)     Start     Ordered   07/07/23 1006  CBC with Differential/Platelet  Daily,   R     Question:  Specimen collection method  Answer:  IV Team=IV Team collect   07/07/23 1005   Signed and Held  Comprehensive metabolic panel  Tomorrow morning,   R       Question:  Specimen collection method  Answer:  IV Team=IV Team collect   Signed and Held   Signed and Held  CBC with Differential/Platelet  Tomorrow morning,   R       Question:  Specimen collection method  Answer:  IV Team=IV Team collect   Signed and Held          Data Reviewed: I have personally reviewed following labs and imaging studies ( see epic result tab) CBC: Recent Labs  Lab 07/05/23 0348 07/06/23 0348 07/07/23 1151 07/08/23 0321 07/09/23 0259  WBC 9.2 10.5 13.2* 13.2* 13.5*  NEUTROABS  --   --  9.2* 10.7* 11.9*  HGB 8.6* 9.0* 8.9* 8.4* 8.8*  HCT 28.3* 28.7* 29.3* 27.2* 28.3*  MCV 100.0 99.7 102.8* 102.6* 101.1*  PLT 21* 20* 20* 17* 15*   CMP: Recent Labs  Lab 07/03/23 0356 07/04/23 0425 07/05/23 0348 07/06/23 0348 07/07/23 0349 07/08/23 0321 07/09/23 0259  NA 128* 131* 131* 130* 130* 127* 130*  K 4.1 4.1 4.0 3.8 4.0 4.2 4.3  CL 97* 98 98 98 99 96* 99  CO2 23 22 23 22 23 22 24   GLUCOSE 214* 128* 126* 95 197* 164* 156*  BUN 17 17 16 18  21* 22* 22*  CREATININE 0.41* 0.48* 0.43* 0.51* 0.70 0.60* 0.58*  CALCIUM 7.5* 7.7* 7.4* 7.4* 7.2* 7.4* 7.5*  MG 1.8 1.7 1.6* 1.6*  --   --   --    GFR: Estimated Creatinine Clearance: 109.8 mL/min (A) (by C-G formula  based on SCr of 0.58 mg/dL (L)). Recent Labs  Lab 07/05/23 0348 07/06/23 0348 07/07/23 0349 07/08/23 0321 07/09/23 0259  AST 113* 119* 102* 106* 141*  ALT 42 45* 42 41 44  ALKPHOS 571* 648* 610* 835* 1,093*  BILITOT 3.9* 3.4* 3.2* 2.8* 3.2*  PROT 4.9* 5.0* 5.0* 4.6* 4.6*  ALBUMIN 1.5* 1.7* 1.6* 1.7* 1.7*   Recent Labs  Lab 07/08/23 0730 07/08/23 1127 07/08/23 1748 07/08/23 2033 07/09/23 0806  GLUCAP 145* 265* 214* 212* 176*    Antimicrobials/Microbiology: Anti-infectives (From admission, onward)    Start     Dose/Rate Route Frequency Ordered Stop   07/04/23 1100  cefTRIAXone (ROCEPHIN) 2 g in sodium chloride 0.9 %  100 mL IVPB        2 g 200 mL/hr over 30 Minutes Intravenous Every 24 hours 07/04/23 1027 07/08/23 1338   07/04/23 1100  metroNIDAZOLE (FLAGYL) IVPB 500 mg        500 mg 100 mL/hr over 60 Minutes Intravenous Every 12 hours 07/04/23 1027           Component Value Date/Time   SDES ABSCESS 08/13/2021 1939   SPECREQUEST RIGHT GROIN ABSCESS 08/13/2021 1939   CULT  08/13/2021 1939    FEW ARCANOBACTERIUM HAEMOLYTICUM Standardized susceptibility testing for this organism is not available. MIXED ANAEROBIC FLORA PRESENT.  CALL LAB IF FURTHER IID REQUIRED.    REPTSTATUS 08/18/2021 FINAL 08/13/2021 1939     Radiology Studies: No results found.    LOS: 11 days   Total time spent in review of labs and imaging, patient evaluation, formulation of plan, documentation and communication with patient/family:  50 minutes  Lanae Boast, MD [facility] 07/09/2023, 11:32 AM

## 2023-07-09 NOTE — Plan of Care (Signed)
  Problem: Education: Goal: Ability to describe self-care measures that may prevent or decrease complications (Diabetes Survival Skills Education) will improve Outcome: Progressing Goal: Individualized Educational Video(s) Outcome: Progressing   Problem: Coping: Goal: Ability to adjust to condition or change in health will improve Outcome: Progressing   Problem: Fluid Volume: Goal: Ability to maintain a balanced intake and output will improve Outcome: Progressing   Problem: Health Behavior/Discharge Planning: Goal: Ability to identify and utilize available resources and services will improve Outcome: Progressing Goal: Ability to manage health-related needs will improve Outcome: Progressing   Problem: Metabolic: Goal: Ability to maintain appropriate glucose levels will improve Outcome: Progressing   Problem: Nutritional: Goal: Maintenance of adequate nutrition will improve Outcome: Progressing

## 2023-07-09 NOTE — Consult Note (Signed)
 Palliative Care Consult Note                                  Date: 07/09/2023   Patient Name: [name]  DOB: 1965-07-13  MRN: [id number]  Age / Sex: 58 y.o., male  PCP: Joycelyn Rua, MD Referring Physician: Lanae Boast, MD  Reason for Consultation: Establishing goals of care, "uncontrolled pain"  HPI/Patient Profile: 58 y.o. 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  Hemoglobin on admission was 6.5 and PRBC transfusion was ordered.  CT angio was negative for active bleeding but showed colonic wall thickening distal to prior anastomosis. Colonoscopy and EGD done 06/30/23. Colonoscopy prep was inadequate. Mucosal ulceration in the distal descending colon, visible oozing blood vessel endoscopically treated. EGD showed esophagitis with no other signs of bleeding.  Hospitalization complicated by hyperbilirubinemia, transaminitis, and possible cholecystitis.  Palliative Medicine has been consulted for goals of care and "uncontrolled pain".   Subjective:   Extensive chart review has been completed including labs, vital signs, imaging, progress/consult notes, orders, medications and available advance directive documents.   I met with patient and his wife/[name] at bedside to discuss diagnosis, prognosis, GOC, disposition, and options. Patient is sleeping through most of my visit. He also has decreased hearing (per wife this is a new issue).   I introduced Palliative Medicine as specialized medical care for people living with serious illness. It focuses on providing relief from the symptoms and stress of a serious illness.   We discussed patient's current illness and what it means in the larger context of his ongoing co-morbidities. Current clinical status was reviewed.   Created  space and opportunity for patient and family to express thoughts and feelings regarding current medical situation. Values and goals of care important to patient and family were attempted to be elicited.  Belenda Cruise reports that Nick's pain regimen was working quite well up until today. He is now complaining of sharp pain that "shoots across his abdomen". She has also noticed abdominal distention/bloating. There is concern this is related to gallbladder infection.   Belenda Cruise expresses her main goal is for Weston Brass not to be in pain. She also does not want him to spend his remaining days in the hospital.   Belenda Cruise understands that additional chemotherapy is on hold for now due to low platelets. She reports they are considering home hospice services, but want to ensure there is a solid plan in place to manage his pain. She is open to discussing further with a hospice liaison tomorrow.   We also discussed code status today. I explained that DNR status does not change the medical plan and it only comes into effect in the event a person has arrested (died).  Discussed that it is a protective measure to prevent trauma/pain to a person in their last moments of life. I also explained the difference between pre-arrest interventions and limited interventions (do not intubate). Belenda Cruise states that DNR with limited interventions is most consistent with Nick's previously discussed wishes.   Questions and concerns addressed. PMT contact card given. Plan for follow-up tomorrow.    Review of Systems  Gastrointestinal:  Positive for abdominal distention and abdominal pain.    Objective:   Primary Diagnoses: Present on Admission: . Lower GI bleed  Physical Exam Vitals reviewed.  Constitutional:      General: He is sleeping. He is not in acute distress.    Appearance: He is ill-appearing.  Pulmonary:     Effort: Pulmonary effort is normal.  Neurological:     Mental Status: He is easily aroused.      Palliative Assessment/Data: PPS 30%     Assessment & Plan:   SUMMARY OF RECOMMENDATIONS   Code status changed to DNR/DNI Dexamethasone 4 mg IV x 1 dose Continue MS Contin 100 mg every 12 hours (if needed, can consider increasing to 100 mg every 8 hours) Continue prn IV and PO dilaudid PMT will follow-up tomorrow  Primary Decision Maker: Patient with support from his wife/[name]   Prognosis:  < 6 months  Discharge Planning:  To Be Determined    Thank you for allowing [place] to participate in the care of STIRLING ORTON   Time Total: 90 minutes  Detailed review of medical records (labs, imaging, vital signs), medically appropriate exam, discussed with treatment team, counseling and education to patient, family, & staff, documenting clinical information, medication management, coordination of care.   Signed by: Sherlean Foot, NP Palliative Medicine Team  Team Phone # [phone]  For individual providers, please see AMION

## 2023-07-10 DIAGNOSIS — M549 Dorsalgia, unspecified: Secondary | ICD-10-CM

## 2023-07-10 DIAGNOSIS — K922 Gastrointestinal hemorrhage, unspecified: Secondary | ICD-10-CM | POA: Diagnosis not present

## 2023-07-10 DIAGNOSIS — C189 Malignant neoplasm of colon, unspecified: Secondary | ICD-10-CM

## 2023-07-10 LAB — CBC WITH DIFFERENTIAL/PLATELET
Abs Immature Granulocytes: 1.9 10*3/uL — ABNORMAL HIGH (ref 0.00–0.07)
Basophils Absolute: 0.1 10*3/uL (ref 0.0–0.1)
Basophils Relative: 1 %
Eosinophils Absolute: 0 10*3/uL (ref 0.0–0.5)
Eosinophils Relative: 0 %
HCT: 29.5 % — ABNORMAL LOW (ref 39.0–52.0)
Hemoglobin: 9.2 g/dL — ABNORMAL LOW (ref 13.0–17.0)
Immature Granulocytes: 14 %
Lymphocytes Relative: 6 %
Lymphs Abs: 0.9 10*3/uL (ref 0.7–4.0)
MCH: 32.2 pg (ref 26.0–34.0)
MCHC: 31.2 g/dL (ref 30.0–36.0)
MCV: 103.1 fL — ABNORMAL HIGH (ref 80.0–100.0)
Monocytes Absolute: 1 10*3/uL (ref 0.1–1.0)
Monocytes Relative: 7 %
Neutro Abs: 10.2 10*3/uL — ABNORMAL HIGH (ref 1.7–7.7)
Neutrophils Relative %: 72 %
Platelets: 12 10*3/uL — CL (ref 150–400)
RBC: 2.86 MIL/uL — ABNORMAL LOW (ref 4.22–5.81)
RDW: 22.2 % — ABNORMAL HIGH (ref 11.5–15.5)
Smear Review: NORMAL
WBC: 14 10*3/uL — ABNORMAL HIGH (ref 4.0–10.5)
nRBC: 12 % — ABNORMAL HIGH (ref 0.0–0.2)

## 2023-07-10 LAB — COMPREHENSIVE METABOLIC PANEL WITH GFR
ALT: 42 U/L (ref 0–44)
AST: 136 U/L — ABNORMAL HIGH (ref 15–41)
Albumin: 1.7 g/dL — ABNORMAL LOW (ref 3.5–5.0)
Alkaline Phosphatase: 1449 U/L — ABNORMAL HIGH (ref 38–126)
Anion gap: 7 (ref 5–15)
BUN: 20 mg/dL (ref 6–20)
CO2: 24 mmol/L (ref 22–32)
Calcium: 7.3 mg/dL — ABNORMAL LOW (ref 8.9–10.3)
Chloride: 100 mmol/L (ref 98–111)
Creatinine, Ser: 0.48 mg/dL — ABNORMAL LOW (ref 0.61–1.24)
GFR, Estimated: >60 mL/min (ref >60)
Glucose, Bld: 252 mg/dL — ABNORMAL HIGH (ref 70–99)
Potassium: 4.5 mmol/L (ref 3.5–5.1)
Sodium: 131 mmol/L — ABNORMAL LOW (ref 135–145)
Total Bilirubin: 3.9 mg/dL — ABNORMAL HIGH (ref 0.0–1.2)
Total Protein: 4.4 g/dL — ABNORMAL LOW (ref 6.5–8.1)

## 2023-07-10 LAB — GLUCOSE, CAPILLARY
Glucose-Capillary: 289 mg/dL — ABNORMAL HIGH (ref 70–99)
Glucose-Capillary: 235 mg/dL — ABNORMAL HIGH (ref 70–99)
Glucose-Capillary: 281 mg/dL — ABNORMAL HIGH (ref 70–99)
Glucose-Capillary: 309 mg/dL — ABNORMAL HIGH (ref 70–99)

## 2023-07-10 NOTE — Progress Notes (Addendum)
 [name]   DOB:1965/07/31   ZO#:[id number]      ASSESSMENT & PLAN:  Adenocarcinoma of colon, metastatic - Moderate to poorly differentiated - Diagnosed 01/07/2019 - Status post left colectomy 03/04/2019.  Status post FOLFOX chemotherapy from 2022 2021 x 12 cycles, completed 08/22/2019. - Subsequently started on chemotherapy regimen FOLFIRI/bevacizumab on 05/26/2022.  Completed cycle 24 on 05/06/2023. - Started on cycle 1 FOLFOX/Avastin on 06/11/2023 - Consideration for C2 FOLFOX without Bevacizumab previously planned, however due to elevated bili, low platelets, and patient's poor condition, will hold at this time.     - CODE STATUS discussed with patient and patient and wife, now transitioned to DNR-Limited.   Also discussed inpatient Rehab stay, however does not appear to be a candidate at this time.  Recommend hospice, referral made to home hospice.  - Oncology/[name] following closely.     Severe pain Bilateral lower extremity weakness - Ongoing bilateral lower extremity weakness and severe pain in extremities and shoulders.      - Secondary to extensive bone mets - Continue MS Contin, Dilaudid and Decadron as ordered.   - Evaluated by Palliative, much appreciated.   - Family considering home hospice; meeting with hospice liaison today.  - Continue PT/OT and out of bed as tolerated.    Acute on chronic thrombocytopenia - Platelets low 12k. No transfusion recommended. Hold platelet transfusion except for active bleeding.  - Recommend transfusion of platelets for counts <10 K or <50 K with active bleeding - Noted with erythema/conjunctival hemorrhage left eye - Continue to hold anticoagulation due to low platelets.  - Continue to monitor CBC with differential and transfuse as needed   Acute rectal bleeding, resolved Acute blood loss anemia Acute on chronic anemia - Low hemoglobin likely related to lower GI bleed.  Patient reports no further rectal bleeding since colonoscopy  done 06/30/2023. - Hemoglobin with slight improvement 9.2  - Endoscopy and colonoscopy done 06/30/2023.  Colonoscopy prep was inadequate.  Mucosal ulceration in the distal descending colon, visible oozing blood vessel endoscopically treated.  EGD showed esophagitis with no other signs of bleeding. - Transfuse PRBC for Hgb <7.0 - Continue to monitor CBC with differential closely   Hyperbilirubinemia Transaminitis Possible cholecystitis - Unclear etiology.  Jaundice and scleral icterus   - Bili with slight increase to 3.2    - Previously scheduled for HIDA scan.  On hold for now. - Continue antibiotics with Rocephin as ordered, status post Flagyl. 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  Wife and parents at bedside.  Wife states whenever he awakens, gives pain score as 10.  However she is aware that pain meds are being given at scheduled times.  Also now with complete loss of hearing, now writing on a pad to communicate.    Objective:  Vitals:   07/10/23 0412 07/10/23 0847  BP: (!) 150/95   Pulse: 97   Resp: 20   Temp: 97.8 F (36.6 C)   SpO2: 97% 95%     Intake/Output Summary (Last 24 hours) at 07/10/2023 1001 Last data filed at 07/10/2023 0413 Gross per 24 hour  Intake 1367 ml  Output 1200  ml  Net 167 ml     PHYSICAL EXAMINATION: ECOG PERFORMANCE STATUS: 4 - Bedbound  Vitals:   07/10/23 0412 07/10/23 0847  BP: (!) 150/95   Pulse: 97   Resp: 20   Temp: 97.8 F (36.6 C)   SpO2: 97% 95%   Filed Weights   06/28/23 0929  Weight: 180 lb (81.6 kg)    GENERAL: alert, no distress and comfortable SKIN: skin  color, texture, turgor are normal, no rashes or significant lesions EYES: normal, conjunctiva are pink and non-injected, sclera clear OROPHARYNX: no exudate, no erythema and lips, buccal mucosa, and tongue normal  NECK: supple, thyroid normal size, non-tender, without nodularity LYMPH: no palpable lymphadenopathy in the cervical, axillary or inguinal LUNGS: clear to auscultation and percussion with normal breathing effort HEART: regular rate & rhythm and no murmurs and no lower extremity edema ABDOMEN: abdomen soft, non-tender and normal bowel sounds MUSCULOSKELETAL: no cyanosis of digits and no clubbing  PSYCH: alert & oriented x 3 with fluent speech NEURO: no focal motor/sensory deficits   All questions were answered. The patient knows to call the clinic with any problems, questions or concerns.   The total time spent in the appointment was 40 minutes encounter with patient including review of chart and various tests results, discussions about plan of care and coordination of care plan  David Bills, NP 07/10/2023 10:01 AM    Labs Reviewed:  Lab Results  Component Value Date   WBC 14.0 (H) 07/10/2023   HGB 9.2 (L) 07/10/2023   HCT 29.5 (L) 07/10/2023   MCV 103.1 (H) 07/10/2023   PLT 12 (LL) 07/10/2023   Recent Labs    07/02/23 0330 07/03/23 0356 07/03/23 1244 07/04/23 0425 07/08/23 0321 07/09/23 0259 07/10/23 0238  NA 126*   < >  --    < > 127* 130* 131*  K 4.1   < >  --    < > 4.2 4.3 4.5  CL 98   < >  --    < > 96* 99 100  CO2 21*   < >  --    < > 22 24 24   GLUCOSE 118*   < >  --    < > 164* 156* 252*  BUN 14   < >  --    < > 22* 22* 20  CREATININE 0.53*   < >  --    < > 0.60* 0.58* 0.48*  CALCIUM 7.4*   < >  --    < > 7.4* 7.5* 7.3*  GFRNONAA >60   < >  --    < > >60 >60 >60  PROT 4.9*   < >  --    < > 4.6* 4.6* 4.4*  ALBUMIN 1.6*   < >  --    < > 1.7* 1.7* 1.7*  AST 63*   < >  --    < > 106* 141* 136*  ALT 21   < >  --    < > 41 44 42  ALKPHOS 465*   < >  --     < > 835* 1,093* 1,449*  BILITOT 4.9*   < > 5.4*   < > 2.8* 3.2* 3.9*  BILIDIR 2.9*  --  3.4*  --   --   --   --   IBILI  --   --  2.0*  --   --   --   --    < > =  values in this interval not displayed.    Studies Reviewed:  US ABDOMEN LIMITED RUQ (LIVER/GB) Result Date: 07/03/2023 CLINICAL DATA:  Transaminitis EXAM: ULTRASOUND ABDOMEN LIMITED RIGHT UPPER QUADRANT COMPARISON:  CT 06/28/2023 and older FINDINGS: Gallbladder: Distended gallbladder with sludge and stones. Borderline wall thickening of 3 mm. No sonographic Murphy's sign reported. Trace adjacent fluid suggested. Common bile duct: Diameter: 3 mm Liver: No focal lesion identified. Within normal limits in parenchymal echogenicity. Portal vein is patent on color Doppler imaging with normal direction of blood flow towards the liver. Other: Right pleural effusion. IMPRESSION: Distended gallbladder with sludge and stones. Borderline wall thickening. Question trace adjacent fluid. No ductal dilatation. There is further concern of acute cholecystitis a HIDA scan could be considered. Right pleural effusion. Electronically Signed   By: Karen Kays M.D.   On: 07/03/2023 16:02   CT ANGIO GI BLEED Result Date: 06/28/2023 CLINICAL DATA:  Lower GI bleeding. Mesenteric ischemia, acute. Metastatic colon cancer EXAM: CTA ABDOMEN AND PELVIS WITHOUT AND WITH CONTRAST TECHNIQUE: Multidetector CT imaging of the abdomen and pelvis was performed using the standard protocol during bolus administration of intravenous contrast. Multiplanar reconstructed images and MIPs were obtained and reviewed to evaluate the vascular anatomy. RADIATION DOSE REDUCTION: This exam was performed according to the departmental dose-optimization program which includes automated exposure control, adjustment of the mA and/or kV according to patient size and/or use of iterative reconstruction technique. CONTRAST:  80mL OMNIPAQUE IOHEXOL 350 MG/ML SOLN COMPARISON:  06/04/2023 FINDINGS: VASCULAR  Aorta: Normal caliber aorta without aneurysm, dissection, vasculitis or significant stenosis. Mild atherosclerosis. Celiac: Patent without evidence of aneurysm, dissection, vasculitis or significant stenosis. SMA: Patent without evidence of aneurysm, dissection, vasculitis or significant stenosis. Renals: The right renal artery is patent without evidence of aneurysm, dissection, vasculitis, fibromuscular dysplasia or significant stenosis. Patient has had a left nephrectomy. IMA: The origin and proximal aspect of the inferior mesenteric artery is not identified. This does appear to be reconstituted by branches of the superior mesenteric artery. Inflow: Patent without evidence of aneurysm, dissection, vasculitis or significant stenosis. Proximal Outflow: Bilateral common femoral and visualized portions of the superficial and profunda femoral arteries are patent without evidence of aneurysm, dissection, vasculitis or significant stenosis. Veins: Major venous structures are patent. Review of the MIP images confirms the above findings. NON-VASCULAR Lower chest: Small layering left pleural effusion. Hepatobiliary: 1.1 cm hypoattenuating lesion in the periphery of the right lower lobe, not definitively seen on the prior study (series 18, image 29. Previously described subtly hypoattenuating lesions within the liver at the gallbladder fossa and IVC are difficult to characterize but do not appear appreciably changed. Atrophy of the left hepatic lobe. Right hepatic lobe measures 22 cm in length. Small stone within the gallbladder. No biliary dilatation. Pancreas: Grossly unremarkable. Spleen: Grossly unchanged appearance of the spleen with multiple heterogeneously areas of hypoattenuation including dominant lesion measuring approximately 11.2 x 8.5 cm. 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 Adrenals/Urinary Tract: Thickened appearance of the adrenal glands. Prior left nephrectomy. Unremarkable  right kidney. No stone or hydronephrosis. Unremarkable urinary bladder. Stomach/Bowel: Tiny hiatal hernia. Stomach otherwise within normal limits. No abnormally dilated loops of bowel. Postsurgical changes from prior sigmoid resection and reanastomosis abnormal colonic wall thickening in the region of the distal sigmoid colon (series 18, image 66). There is also focal colonic wall thickening just distal to the anastomotic site (series 18, image 57). Normal appendix in the right lower quadrant (series 18, image 53). No evidence  of abnormal contrast accumulation within the bowel on multiphasic imaging to localize site of active gastrointestinal bleeding. Lymphatic: Mildly prominent left external iliac chain lymph node measuring 9 mm (series 18, image 59), unchanged. No new or enlarging abdominopelvic lymph nodes. Reproductive: Prostatomegaly. Other: No ascites or pneumoperitoneum. Musculoskeletal: Extensive, widespread osseous metastatic disease, without evidence of significant progression compared to the prior study. Scoliotic spinal curvature. No pathologic fractures are seen. IMPRESSION: 1. The origin and proximal aspect of the inferior mesenteric artery is not identified. This does appear to be reconstituted by branches of the superior mesenteric artery. Otherwise, no significant vascular abnormality. 2. No evidence of abnormal contrast accumulation within the bowel to localize site of active gastrointestinal bleeding. 3. Postsurgical changes from prior sigmoid resection and reanastomosis. Abnormal colonic wall thickening in the region of the distal sigmoid colon and just distal to the anastomotic site. Findings may represent sites of focal colitis, however recurrent malignancy is not excluded. Follow-up colonoscopy should be considered following the resolution of patient's acute symptoms. 4. New 1.1 cm hypoattenuating lesion in the periphery of the right hepatic lobe, not definitively seen on the prior study.  This is concerning for a new site of metastatic disease. 5. Extensive, widespread osseous metastatic disease, without evidence of significant progression compared to the prior study. 6. Small layering left pleural effusion. 7. Cholelithiasis. 8. Aortic atherosclerosis. Electronically Signed   By: Duanne Guess D.O.   On: 06/28/2023 11:18  Mr. Fann was interviewed and examined.  His wife was at the bedside when I saw him at approximately 7 AM this morning.  His pain is under better control with the current narcotic regimen.  He has bilateral hearing loss.  This has progressed over the past several days.  No bleeding.  I discussed the poor prognosis with Mr. Bradly and his wife again today.  He is desirous to return home if arrangements can be made for home hospice and additional care in the home.  He is also a candidate for residential hospice.  I do not recommend platelet transfusion support unless he has bleeding, or for temporary palliation if the platelet count returns at less than 10,000.  I continue to feel the severe thrombocytopenia is due to bone marrow involvement with tumor.  He has developed chin numbness and bilateral hearing loss.  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  Ms. Balthazor asked about pain control if it becomes difficult for him to take oral narcotics.  He has a Port-A-Cath in place.  He can be converted to a morphine or Dilaudid drip if needed.  Sublingual morphine can be used for breakthrough pain.  Recommendations: Continue MS Contin, Dilaudid, and Decadron for pain Home with hospice versus residential hospice Transfuse platelets for bleeding Consider neurology evaluation of hearing loss if patient/family desire

## 2023-07-10 NOTE — Plan of Care (Signed)

## 2023-07-10 NOTE — TOC Progression Note (Signed)
 Transition of Care [facility]) - Progression Note    Patient Details  Name: [name] MRN: [id number] Date of Birth: 1966-01-03  Transition of Care [facility]) CM/SW Contact  Larrie Kass, LCSW Phone Number: 07/10/2023, 11:12 AM  Clinical Narrative:     CSW received message pt's family has decied to move forward with discussing hospice services. CSW spoke with pt's wife Belenda Cruise to confirm referral to be made. CSW sent referral to [facility] hospital liaison to discuss hospice services. TOC to follow.  Expected Discharge Plan:  (TBD) Barriers to Discharge: Continued Medical Work up  Expected Discharge Plan and Services                                               Social Determinants of Health (SDOH) Interventions SDOH Screenings   Food Insecurity: No Food Insecurity (06/28/2023)  Housing: Low Risk  (06/28/2023)  Transportation Needs: No Transportation Needs (06/28/2023)  Utilities: Not At Risk (06/28/2023)  Alcohol Screen: Low Risk  (04/28/2023)  Depression (PHQ2-9): Low Risk  (04/28/2023)  Social Connections: Socially Integrated (06/03/2023)  Tobacco Use: Low Risk  (06/28/2023)    Readmission Risk Interventions    06/29/2023   10:35 AM 05/27/2023   12:04 PM  Readmission Risk Prevention Plan  Transportation Screening Complete Complete  PCP or Specialist Appt within 5-7 Days  Complete  Home Care Screening  Complete  Medication Review (RN CM)  Complete  HRI or Home Care Consult Complete   Social Work Consult for Recovery Care Planning/Counseling Complete   Palliative Care Screening Not Applicable   Medication Review Oceanographer) Complete

## 2023-07-10 NOTE — Progress Notes (Signed)
 PT Cancellation Note  Patient Details Name: [name] MRN: [id number] DOB: [birth year]   Cancelled Treatment:     Pt and wife awaiting meeting with Hospice Liaison today and leaning towards Home Hospice. Currently pt's platelet levels (12) are too low for skilled PT/mobility. Will hold session today and re-attempt next available date/time per POC and tolerance.    [name] 07/10/2023, 11:57 AM

## 2023-07-10 NOTE — Progress Notes (Signed)
 PROGRESS NOTE KEYNAN HEFFERN  ZOX:[id number] DOB: November 13, 1965 DOA: 06/28/2023 PCP: Joycelyn Rua, MD  Brief Narrative/Hospital Course:  58 year old with history of colon cancer on FOLFOX, Avastin follows Dr [name], insulin-dependent DM2, HTN, HLD, DVT on Pradaxa admitted to the hospital for bright red blood per rectum/acute blood loss anemia.  Recently treated for gluteal fold cellulitis.  Last dose of Pradaxa was the night before his hospitalization.  Admission hemoglobin was noted to be 6.5 therefore PRBC transfusion was ordered.  CT angio was negative for active bleeding but showed colonic wall thickening distal to prior anastomosis.  Colonoscopy prep was overall inadequate stool was noted in sigmoid colon.  There was oozing distal descending colon blood vessel which was endoscopically treated.  EGD showed grade B esophagitis and congested duodenal mucosa but no other signs of bleeding.  For now GI recommended holding off on anticoagulation for at least 1 week. Overall remains hemodynamically stable. Due to complaints of decreased hearing on left ear discussed with oncology question if he has metastatic spots at base of skull-advised hospice based upon his overall functional status and metastatic disease AND patient does not feel ready yet.  Consultation: Gastroenterology [facility] Oncology  Procedures/testing: E4/1 : EGD/ COLONOSCOPY (Left)-CONTROL OF HEMORRHAGE, GI TRACT, ENDOSCOPIC, BY CLIPPING OR OVERSEWING SCLEROTHERAPY   Subjective: Seen examined He is sleeping able to wake up. Wife and parents at bedside Abdomen is less distended. Overnight afebrile BP stable on room air  Labs reviewed this morning T. bili trending up 3.9, leukocytosis 14 platelet further down to 12k. Continues to need Dilaudid 0.5-1 mg iv last dose yesterday 5 PM, on oral Dilaudid received early this morning  at 4, on scheduled MS Contin 100 twice daily  Assessment & Plan:  Adenocarcinoma of colon with  metastasis-diagnosed 01/07/2019 s/p colectomy hx and multiple chemo treatment Severe cancer-related pain-diffusedly in bones Lower extremity weakness/debility from cancer: Patient has pain due to extensive bone metastasis and has deconditioning and weakness. At home on 30 mg bid mos contin and po dilaudid 4mg  q4h prn, Having worsening pain  despite being on  MS Contin 100 mg bid, po decadron, and IV/PO Dilaudid prn Palliative care has been consulted> [name] recommended hospice services at this time. Family changed to DNR limited 4/10, toc sw consulted regarding hospice services Continue to manage pain as per palliative care input appreciated, discussed this morning His life expectancy 2wks-month per oncology and [facility] hospice recommended and TOC consulted.  BRBPR-colorectal ABLA Acute on chronic anemia B/L hB ~8.0, s/p 3 units of PRBC-and HB stable since. S/P EGD colonoscopy 4/1- prep was overall inadequate,oozing distal descending colon blood vessel which was endoscopically treated.  EGD showed grade B esophagitis and congested duodenal mucosa but no other signs of bleeding.  GI recommended to hold off anticoagulation at least 1 week and gi signed off Cont PPI BID, cont to hold aspirin. Recent Labs  Lab 07/06/23 0348 07/07/23 1151 07/08/23 0321 07/09/23 0259 07/10/23 0238  HGB 9.0* 8.9* 8.4* 8.8* 9.2*  HCT 28.7* 29.3* 27.2* 28.3* 29.5*   Acute on chronic thrombocytopenia: In the setting of bony metastasis and bone marrow infiltration Continue to hold anticoagulation monitor CBC and transfuse if < 10k as per oncology Recent Labs  Lab 07/06/23 0348 07/07/23 1151 07/08/23 0321 07/09/23 0259 07/10/23 0238  PLT 20* 20* 17* 15* 12*    Hyperbilirubinemia/Transaminitis Distended gallbladder with sludge and stones ? Acute cholecystitis: Transaminitis likely multifactorial mostly from bone metastasis - RUQ  US showed distended gallbladder with  sludge and stone,  borderline wall thickening-surgery was consulted placed on IV antibiotics and HIDA scan ordered but given he is needing continuous narcotics along with scheduled MS Contin, HIDA scan will likely be false-extensively discussed with patient's oncologist, surgery-and given difficult situation at this time decided to continue antibiotics ,supportive care, pain management and HIDA scan discontinued per [facility]. Per [facility] he is moderately poor surgical candidate for cholecystectomy, is profoundly thrombocytopenic-and also not a ideal candidate for cholecystostomy drain if HIDA were positive.  Now LFTs are trending up, Continue on ceftriaxone and Flagyl as supportive care until hospice decision. Recent Labs  Lab 07/03/23 1244 07/04/23 0425 07/06/23 0348 07/07/23 0349 07/07/23 1151 07/08/23 0321 07/09/23 0259 07/10/23 0238  AST  --    < > 119* 102*  --  106* 141* 136*  ALT  --    < > 45* 42  --  41 44 42  ALKPHOS  --    < > 648* 610*  --  835* 1,093* 1,449*  BILITOT 5.4*   < > 3.4* 3.2*  --  2.8* 3.2* 3.9*  BILIDIR 3.4*  --   --   --   --   --   --   --   IBILI 2.0*  --   --   --   --   --   --   --   PROT  --    < > 5.0* 5.0*  --  4.6* 4.6* 4.4*  ALBUMIN  --    < > 1.7* 1.6*  --  1.7* 1.7* 1.7*  PLT  --    < > 20*  --  20* 17* 15* 12*   < > = values in this interval not displayed.   Diminished hearing sensation of the left/fuzziness/ringing since the left ear: Continue eardrops.  Having worsening of his diminished hearing bilaterally today and on discussion oncology likely from his metastatic disease.  He is moving all extremities well nonfocal. Having some ringing sensation. MRI brain with and without contrast on 05/25/23 had 2 punctate foci of enhancement on the left parietal occipital region could be metastasis,  IDDM type 2 with uncontrolled hyperglycemia: PTA on Plargine 41 u daily, metformin 1000 twice daily -increase Semglee to 15 units and continue SSI today Recent Labs  Lab 07/08/23 2033  07/09/23 0806 07/09/23 1135 07/09/23 1627 07/09/23 2206  GLUCAP 212* 176* 222* 330* 231*    Hypocalcemia Hypomagnesemia Hyponatremia: Corrected calcium is stable due to hypoalbuminemia, magnesium replaced, sodium low, monitor   Hyperlipidemia Holding statin 2/2 transaminitis.  Essential hypertension BP controlled on Toprol-XL 25 mg.Lisinopril on hold.  History of DVT Pradaxa remains on hold due to GI bleeding- on hold x 1 week- but given thrombocytopenia continue to hold for now  Goals of care/CODE STATUS: Extensively discussed goals of care and encourage DNR/DNI after discussing his case with [name] Unfortunately he remains full code for now. Dr Truett Perna extensively discussed about hospice at this point of time but patient ad his wife not electing to enter hospice. Today patient seems to be inclined towards keeping him comfortable-I have encouraged him to discuss with his wife. Very difficult situation and patient having significant bone pain/diffuse pain.  He is not a candidate for CIR He is a candidate for hospice Palliative care has been consulted> changed to DNR limited and hospice has been consulted patient remains high risk for decompensation, and readmission     DVT prophylaxis: SCDs Start: 06/28/23 1229 Code Status:  Code Status: Limited: Do not attempt resuscitation (DNR) -DNR-LIMITED -Do Not Intubate/DNI  Family Communication: plan of care discussed with patient and his family at bedside, wife updated at bedside along with parents  Patient status is: Remains hospitalized because of severity of illness Level of care: Med-Surg  Dispo: The patient is from: Home w/ wife and kids.            Anticipated disposition: Anticipating hospice placement.  Objective: Vitals last 24 hrs: Vitals:   07/09/23 2006 07/09/23 2018 07/10/23 0412 07/10/23 0847  BP: (!) 153/100  (!) 150/95   Pulse: 93 93 97   Resp: 18 20 20    Temp: 98.7 F (37.1 C)  97.8 F (36.6 C)    TempSrc: Oral  Oral   SpO2: 99% 98% 97% 95%  Weight:      Height:       Weight change:   Physical Examination: General exam: Sleepy lethargic able to wake up  HEENT:Oral mucosa moist, Ear/Nose WNL grossly Respiratory system: Bilaterally clear BS,no use of accessory muscle Cardiovascular system: S1 & S2 +, No JVD. Gastrointestinal system: Abdomen soft,NT,ND, BS+ Nervous System: Alert, awake, moving all extremities,and following commands. Extremities: LE edema +,distal peripheral pulses palpable and warm.  Skin: No rashes,+icterus. MSK: Normal muscle bulk,tone, power   Medications reviewed:  Scheduled Meds:  sodium chloride   Intravenous Once   budesonide  0.25 mg Nebulization BID   carbamide peroxide  5 drop Left EAR BID   Chlorhexidine Gluconate Cloth  6 each Topical Daily   ciprofloxacin-hydrocortisone  3 drop Left EAR BID   dexamethasone  4 mg Oral Daily   insulin aspart  0-15 Units Subcutaneous TID WC & HS   insulin glargine-yfgn  10 Units Subcutaneous Daily   loratadine  10 mg Oral Daily   metoprolol succinate  25 mg Oral Daily   morphine  100 mg Oral Q12H   pantoprazole (PROTONIX) IV  40 mg Intravenous Q12H   senna-docusate  1 tablet Oral BID  Continuous Infusions:  cefTRIAXone (ROCEPHIN)  IV 2 g (07/09/23 1454)   metronidazole 500 mg (07/10/23 0829)     Diet Order             Diet regular Room service appropriate? Yes; Fluid consistency: Thin  Diet effective now                  Intake/Output Summary (Last 24 hours) at 07/10/2023 1252 Last data filed at 07/10/2023 1231 Gross per 24 hour  Intake 1292 ml  Output 750 ml  Net 542 ml   Net IO Since Admission: 6,597.77 mL [07/10/23 1252]  Wt Readings from Last 3 Encounters:  06/28/23 81.6 kg  06/17/23 81.6 kg  06/10/23 85 kg     Unresulted Labs (From admission, onward)     Start     Ordered   07/10/23 0500  Comprehensive metabolic panel  Daily,   R     Question:  Specimen collection method  Answer:   IV Team=IV Team collect   07/09/23 1157   07/07/23 1006  CBC with Differential/Platelet  Daily,   R     Question:  Specimen collection method  Answer:  IV Team=IV Team collect   07/07/23 1005   Signed and Held  Comprehensive metabolic panel  Tomorrow morning,   R       Question:  Specimen collection method  Answer:  IV Team=IV Team collect   Signed and Held   Signed and Held  CBC with Differential/Platelet  Tomorrow morning,   R       Question:  Specimen collection method  Answer:  IV Team=IV Team collect   Signed and Held          Data Reviewed: I have personally reviewed following labs and imaging studies ( see epic result tab) CBC: Recent Labs  Lab 07/06/23 0348 07/07/23 1151 07/08/23 0321 07/09/23 0259 07/10/23 0238  WBC 10.5 13.2* 13.2* 13.5* 14.0*  NEUTROABS  --  9.2* 10.7* 11.9* 10.2*  HGB 9.0* 8.9* 8.4* 8.8* 9.2*  HCT 28.7* 29.3* 27.2* 28.3* 29.5*  MCV 99.7 102.8* 102.6* 101.1* 103.1*  PLT 20* 20* 17* 15* 12*   CMP: Recent Labs  Lab 07/04/23 0425 07/05/23 0348 07/06/23 0348 07/07/23 0349 07/08/23 0321 07/09/23 0259 07/10/23 0238  NA 131* 131* 130* 130* 127* 130* 131*  K 4.1 4.0 3.8 4.0 4.2 4.3 4.5  CL 98 98 98 99 96* 99 100  CO2 22 23 22 23 22 24 24   GLUCOSE 128* 126* 95 197* 164* 156* 252*  BUN 17 16 18  21* 22* 22* 20  CREATININE 0.48* 0.43* 0.51* 0.70 0.60* 0.58* 0.48*  CALCIUM 7.7* 7.4* 7.4* 7.2* 7.4* 7.5* 7.3*  MG 1.7 1.6* 1.6*  --   --   --   --    GFR: Estimated Creatinine Clearance: 109.8 mL/min (A) (by C-G formula based on SCr of 0.48 mg/dL (L)). Recent Labs  Lab 07/06/23 0348 07/07/23 0349 07/08/23 0321 07/09/23 0259 07/10/23 0238  AST 119* 102* 106* 141* 136*  ALT 45* 42 41 44 42  ALKPHOS 648* 610* 835* 1,093* 1,449*  BILITOT 3.4* 3.2* 2.8* 3.2* 3.9*  PROT 5.0* 5.0* 4.6* 4.6* 4.4*  ALBUMIN 1.7* 1.6* 1.7* 1.7* 1.7*   Recent Labs  Lab 07/09/23 1135 07/09/23 1627 07/09/23 2206 07/10/23 0803 07/10/23 1157  GLUCAP 222* 330* 231*  289* 235*    Antimicrobials/Microbiology: Anti-infectives (From admission, onward)    Start     Dose/Rate Route Frequency Ordered Stop   07/09/23 1500  cefTRIAXone (ROCEPHIN) 2 g in sodium chloride 0.9 % 100 mL IVPB        2 g 200 mL/hr over 30 Minutes Intravenous Every 24 hours 07/09/23 1401 07/13/23 1459   07/04/23 1100  cefTRIAXone (ROCEPHIN) 2 g in sodium chloride 0.9 % 100 mL IVPB        2 g 200 mL/hr over 30 Minutes Intravenous Every 24 hours 07/04/23 1027 07/08/23 1338   07/04/23 1100  metroNIDAZOLE (FLAGYL) IVPB 500 mg        500 mg 100 mL/hr over 60 Minutes Intravenous Every 12 hours 07/04/23 1027           Component Value Date/Time   SDES ABSCESS 08/13/2021 1939   SPECREQUEST RIGHT GROIN ABSCESS 08/13/2021 1939   CULT  08/13/2021 1939    FEW ARCANOBACTERIUM HAEMOLYTICUM Standardized susceptibility testing for this organism is not available. MIXED ANAEROBIC FLORA PRESENT.  CALL LAB IF FURTHER IID REQUIRED.    REPTSTATUS 08/18/2021 FINAL 08/13/2021 1939     Radiology Studies: No results found.    LOS: 12 days   Total time spent in review of labs and imaging, patient evaluation, formulation of plan, documentation and communication with patient/family:  50 minutes  Lanae Boast, MD [facility] 07/10/2023, 12:52 PM

## 2023-07-10 NOTE — Progress Notes (Signed)
 OT Cancellation Note  Patient Details Name: [name] MRN: [id number] DOB: 1965/07/20   Cancelled Treatment:    Reason Eval/Treat Not Completed: Medical issues which prohibited therapy Hospice discussions ongoing and platelets low this day as per chart review. OT will follow up when able to participate medically as schedule allows.   [name] OT/L Acute Rehabilitation Department  [phone]  07/10/2023, 2:47 PM

## 2023-07-11 ENCOUNTER — Encounter (HOSPITAL_COMMUNITY): Payer: Self-pay | Admitting: Internal Medicine

## 2023-07-11 DIAGNOSIS — R531 Weakness: Secondary | ICD-10-CM

## 2023-07-11 DIAGNOSIS — K922 Gastrointestinal hemorrhage, unspecified: Secondary | ICD-10-CM | POA: Diagnosis not present

## 2023-07-11 LAB — GLUCOSE, CAPILLARY
Glucose-Capillary: 149 mg/dL — ABNORMAL HIGH (ref 70–99)
Glucose-Capillary: 247 mg/dL — ABNORMAL HIGH (ref 70–99)

## 2023-07-11 MED ORDER — MORPHINE SULFATE ER 100 MG PO TBCR: 100.0000 mg | EXTENDED_RELEASE_TABLET | Freq: Two times a day (BID) | ORAL | Status: DC

## 2023-07-11 MED ORDER — HYDROMORPHONE BOLUS VIA INFUSION
0.2500 mg | INTRAVENOUS | Status: DC | PRN
  Administered 2023-07-11 (×4): 0.25 mg via INTRAVENOUS

## 2023-07-11 MED ORDER — INSULIN GLARGINE-YFGN 100 UNIT/ML ~~LOC~~ SOLN: 10.0000 [IU] | Freq: Every day | SUBCUTANEOUS | Status: DC

## 2023-07-11 MED ORDER — PANTOPRAZOLE SODIUM 40 MG PO TBEC: 40.0000 mg | DELAYED_RELEASE_TABLET | Freq: Every day | ORAL | Status: DC

## 2023-07-11 MED ORDER — CIPROFLOXACIN-HYDROCORTISONE 0.2-1 % OT SUSP: 3.0000 [drp] | Freq: Two times a day (BID) | OTIC | Status: DC

## 2023-07-11 MED ORDER — CARBAMIDE PEROXIDE 6.5 % OT SOLN: 5.0000 [drp] | Freq: Two times a day (BID) | OTIC | Status: DC

## 2023-07-11 MED ORDER — ONDANSETRON HCL 4 MG PO TABS: 4.0000 mg | ORAL_TABLET | Freq: Four times a day (QID) | ORAL | Status: DC | PRN

## 2023-07-11 MED ORDER — METRONIDAZOLE 500 MG PO TABS: 500.0000 mg | ORAL_TABLET | Freq: Three times a day (TID) | ORAL | Status: AC

## 2023-07-11 MED ORDER — HYDROMORPHONE HCL-NACL 50-0.9 MG/50ML-% IV SOLN
0.5000 mg/h | INTRAVENOUS | Status: DC
Start: 2023-07-11 — End: 2023-07-17

## 2023-07-11 MED ORDER — CIPROFLOXACIN HCL 500 MG PO TABS
500.0000 mg | ORAL_TABLET | Freq: Two times a day (BID) | ORAL | Status: AC
Start: 2023-07-11 — End: 2023-07-16

## 2023-07-11 MED ORDER — HYDROMORPHONE HCL-NACL 50-0.9 MG/50ML-% IV SOLN
0.5000 mg/h | INTRAVENOUS | Status: DC
Start: 2023-07-11 — End: 2023-07-11
  Administered 2023-07-11: 0.5 mg/h via INTRAVENOUS
  Filled 2023-07-11: qty 50

## 2023-07-11 NOTE — Progress Notes (Signed)
 KEZIAH DROTAR   DOB:28-Mar-1966   HY#:[id number]      ASSESSMENT & PLAN:  Adenocarcinoma of colon, metastatic - Moderate to poorly differentiated - Diagnosed 01/07/2019 - Status post left colectomy 03/04/2019.  Status post FOLFOX chemotherapy from 2022 2021 x 12 cycles, completed 08/22/2019. - Subsequently started on chemotherapy regimen FOLFIRI/bevacizumab on 05/26/2022.  Completed cycle 24 on 05/06/2023. - Started on cycle 1 FOLFOX/Avastin on 06/11/2023 - Consideration for C2 FOLFOX without Bevacizumab previously planned, however due to elevated bili, low platelets, and patient's poor condition, will hold at this time.     - CODE STATUS discussed with patient and patient and wife, now transitioned to DNR-Limited.  - Plan is for transition to residential hospice  severe pain Bilateral lower extremity weakness - Ongoing bilateral lower extremity weakness and severe pain in extremities and shoulders.      - Secondary to extensive bone mets - Discussion with patient and wife today, he prefers transition to a Dilaudid or morphine drip/bolus Acute on chronic thrombocytopenia - Platelets severely decreased, likely secondary to bone marrow involvement with metastatic colon cancer   Acute rectal bleeding, resolved Acute blood loss anemia Acute on chronic anemia  Hyperbilirubinemia Transaminitis Possible cholecystitis - Hyperbilirubinemia secondary to gallbladder disease versus intra medullary hemolysis  Acute Hearing Loss -sudden hearing loss and chin numbness -likely due to tumor involvement at the base of the skull/cranial nerves -supportive care  Diabetes Hypertension - Continue monitoring blood sugar levels and insulin per sliding scale - Continue antihypertensives as ordered - Monitor closely      Code Status DNR-Limited  Subjective:  Mr. Pasha continues to have hearing loss.  He reports diffuse pain including the abdomen and multiple bone sites.  His wife is at the bedside  this morning. Objective:  Vitals:   07/11/23 0430 07/11/23 0903  BP: (!) 161/96   Pulse: (!) 104   Resp: 20   Temp: 98.6 F (37 C)   SpO2: 97% 96%     Intake/Output Summary (Last 24 hours) at 07/11/2023 1035 Last data filed at 07/11/2023 0900 Gross per 24 hour  Intake 770.03 ml  Output 375 ml  Net 395.03 ml     PHYSICAL EXAMINATION: ECOG PERFORMANCE STATUS: 4 - Bedbound  Vitals:   07/11/23 0430 07/11/23 0903  BP: (!) 161/96   Pulse: (!) 104   Resp: 20   Temp: 98.6 F (37 C)   SpO2: 97% 96%   Filed Weights   06/28/23 0929  Weight: 180 lb (81.6 kg)   HEENT: The mouth is dry, no thrush Abdomen: Mild diffuse tenderness, no mass, no hepatosplenomegaly Neurologic: Alert, follows commands, communicates with a writing board   Thornton Papas, MD 07/11/2023 10:35 AM    Labs Reviewed:  Lab Results  Component Value Date   WBC 14.0 (H) 07/10/2023   HGB 9.2 (L) 07/10/2023   HCT 29.5 (L) 07/10/2023   MCV 103.1 (H) 07/10/2023   PLT 12 (LL) 07/10/2023   Recent Labs    07/02/23 0330 07/03/23 0356 07/03/23 1244 07/04/23 0425 07/08/23 0321 07/09/23 0259 07/10/23 0238  NA 126*   < >  --    < > 127* 130* 131*  K 4.1   < >  --    < > 4.2 4.3 4.5  CL 98   < >  --    < > 96* 99 100  CO2 21*   < >  --    < > 22 24 24   GLUCOSE 118*   < >  --    < >  164* 156* 252*  BUN 14   < >  --    < > 22* 22* 20  CREATININE 0.53*   < >  --    < > 0.60* 0.58* 0.48*  CALCIUM 7.4*   < >  --    < > 7.4* 7.5* 7.3*  GFRNONAA >60   < >  --    < > >60 >60 >60  PROT 4.9*   < >  --    < > 4.6* 4.6* 4.4*  ALBUMIN 1.6*   < >  --    < > 1.7* 1.7* 1.7*  AST 63*   < >  --    < > 106* 141* 136*  ALT 21   < >  --    < > 41 44 42  ALKPHOS 465*   < >  --    < > 835* 1,093* 1,449*  BILITOT 4.9*   < > 5.4*   < > 2.8* 3.2* 3.9*  BILIDIR 2.9*  --  3.4*  --   --   --   --   IBILI  --   --  2.0*  --   --   --   --    < > = values in this interval not displayed.    Studies Reviewed:  US  ABDOMEN LIMITED  RUQ (LIVER/GB) Result Date: 07/03/2023 CLINICAL DATA:  Transaminitis EXAM: ULTRASOUND ABDOMEN LIMITED RIGHT UPPER QUADRANT COMPARISON:  CT 06/28/2023 and older FINDINGS: Gallbladder: Distended gallbladder with sludge and stones. Borderline wall thickening of 3 mm. No sonographic Murphy's sign reported. Trace adjacent fluid suggested. Common bile duct: Diameter: 3 mm Liver: No focal lesion identified. Within normal limits in parenchymal echogenicity. Portal vein is patent on color Doppler imaging with normal direction of blood flow towards the liver. Other: Right pleural effusion. IMPRESSION: Distended gallbladder with sludge and stones. Borderline wall thickening. Question trace adjacent fluid. No ductal dilatation. There is further concern of acute cholecystitis a HIDA scan could be considered. Right pleural effusion. Electronically Signed   By: Adrianna Horde M.D.   On: 07/03/2023 16:02   CT ANGIO GI BLEED Result Date: 06/28/2023 CLINICAL DATA:  Lower GI bleeding. Mesenteric ischemia, acute. Metastatic colon cancer EXAM: CTA ABDOMEN AND PELVIS WITHOUT AND WITH CONTRAST TECHNIQUE: Multidetector CT imaging of the abdomen and pelvis was performed using the standard protocol during bolus administration of intravenous contrast. Multiplanar reconstructed images and MIPs were obtained and reviewed to evaluate the vascular anatomy. RADIATION DOSE REDUCTION: This exam was performed according to the departmental dose-optimization program which includes automated exposure control, adjustment of the mA and/or kV according to patient size and/or use of iterative reconstruction technique. CONTRAST:  80mL OMNIPAQUE IOHEXOL 350 MG/ML SOLN COMPARISON:  06/04/2023 FINDINGS: VASCULAR Aorta: Normal caliber aorta without aneurysm, dissection, vasculitis or significant stenosis. Mild atherosclerosis. Celiac: Patent without evidence of aneurysm, dissection, vasculitis or significant stenosis. SMA: Patent without evidence of aneurysm,  dissection, vasculitis or significant stenosis. Renals: The right renal artery is patent without evidence of aneurysm, dissection, vasculitis, fibromuscular dysplasia or significant stenosis. Patient has had a left nephrectomy. IMA: The origin and proximal aspect of the inferior mesenteric artery is not identified. This does appear to be reconstituted by branches of the superior mesenteric artery. Inflow: Patent without evidence of aneurysm, dissection, vasculitis or significant stenosis. Proximal Outflow: Bilateral common femoral and visualized portions of the superficial and profunda femoral arteries are patent without evidence of aneurysm, dissection, vasculitis or significant stenosis. Veins: Major  venous structures are patent. Review of the MIP images confirms the above findings. NON-VASCULAR Lower chest: Small layering left pleural effusion. Hepatobiliary: 1.1 cm hypoattenuating lesion in the periphery of the right lower lobe, not definitively seen on the prior study (series 18, image 29. Previously described subtly hypoattenuating lesions within the liver at the gallbladder fossa and IVC are difficult to characterize but do not appear appreciably changed. Atrophy of the left hepatic lobe. Right hepatic lobe measures 22 cm in length. Small stone within the gallbladder. No biliary dilatation. Pancreas: Grossly unremarkable. Spleen: Grossly unchanged appearance of the spleen with multiple heterogeneously areas of hypoattenuation including dominant lesion measuring approximately 11.2 x 8.5 cm. 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 Adrenals/Urinary Tract: Thickened appearance of the adrenal glands. Prior left nephrectomy. Unremarkable right kidney. No stone or hydronephrosis. Unremarkable urinary bladder. Stomach/Bowel: Tiny hiatal hernia. Stomach otherwise within normal limits. No abnormally dilated loops of bowel. Postsurgical changes from prior sigmoid resection and  reanastomosis abnormal colonic wall thickening in the region of the distal sigmoid colon (series 18, image 66). There is also focal colonic wall thickening just distal to the anastomotic site (series 18, image 57). Normal appendix in the right lower quadrant (series 18, image 53). No evidence of abnormal contrast accumulation within the bowel on multiphasic imaging to localize site of active gastrointestinal bleeding. Lymphatic: Mildly prominent left external iliac chain lymph node measuring 9 mm (series 18, image 59), unchanged. No new or enlarging abdominopelvic lymph nodes. Reproductive: Prostatomegaly. Other: No ascites or pneumoperitoneum. Musculoskeletal: Extensive, widespread osseous metastatic disease, without evidence of significant progression compared to the prior study. Scoliotic spinal curvature. No pathologic fractures are seen. IMPRESSION: 1. The origin and proximal aspect of the inferior mesenteric artery is not identified. This does appear to be reconstituted by branches of the superior mesenteric artery. Otherwise, no significant vascular abnormality. 2. No evidence of abnormal contrast accumulation within the bowel to localize site of active gastrointestinal bleeding. 3. Postsurgical changes from prior sigmoid resection and reanastomosis. Abnormal colonic wall thickening in the region of the distal sigmoid colon and just distal to the anastomotic site. Findings may represent sites of focal colitis, however recurrent malignancy is not excluded. Follow-up colonoscopy should be considered following the resolution of patient's acute symptoms. 4. New 1.1 cm hypoattenuating lesion in the periphery of the right hepatic lobe, not definitively seen on the prior study. This is concerning for a new site of metastatic disease. 5. Extensive, widespread osseous metastatic disease, without evidence of significant progression compared to the prior study. 6. Small layering left pleural effusion. 7. Cholelithiasis.  8. Aortic atherosclerosis. Electronically Signed   By: Leverne Reading D.O.   On: 06/28/2023 11:18  Mr. Holloway appears unchanged.  He continues to have severe pain secondary to metastatic disease involving the bones.  There may be a component of abdominal pain related to cholecystitis.  He requests a change in the narcotic regimen.  We discussed increasing the MS Contin versus switching to infusional narcotics.  He prefers an IV drip/bolus.  Mr. Paule and his family have decided to transition to hospice care.  They prefer transfer to [facility].  He appears to be a candidate for [facility].  Recommendations: Consult palliative care for conversion to a Dilaudid or morphine infusion/bolus [facility] consult to consider transfer to [facility] I will check on him tomorrow and I will follow him at [facility]

## 2023-07-11 NOTE — Progress Notes (Signed)
 PROGRESS NOTE PACEY ALTIZER  ZOX:[id number] DOB: 1965/11/11 DOA: 06/28/2023 PCP: Joycelyn Rua, MD  Brief Narrative/Hospital Course:  58 year old with history of colon cancer on FOLFOX, Avastin follows Dr [name], insulin-dependent DM2, HTN, HLD, DVT on Pradaxa admitted to the hospital for bright red blood per rectum/acute blood loss anemia.  Recently treated for gluteal fold cellulitis.  Last dose of Pradaxa was the night before his hospitalization.  Admission hemoglobin was noted to be 6.5 therefore PRBC transfusion was ordered.  CT angio was negative for active bleeding but showed colonic wall thickening distal to prior anastomosis.  Colonoscopy prep was overall inadequate stool was noted in sigmoid colon.  There was oozing distal descending colon blood vessel which was endoscopically treated.  EGD showed grade B esophagitis and congested duodenal mucosa but no other signs of bleeding.  For now GI recommended holding off on anticoagulation for at least 1 week. Overall remains hemodynamically stable. Due to complaints of decreased hearing on left ear discussed with oncology question if he has metastatic spots at base of skull-advised hospice based upon his overall functional status and metastatic disease AND patient does not feel ready yet. Patient was having ongoing worsening pain in his hip bone back and also decreasing hearing sensation After extensive discussion with oncology palliative care patient wife and patient agreed for hospice [facility] evaluating  Consultation: Gastroenterology [facility] Oncology  Procedures/testing: E4/1 : EGD/ COLONOSCOPY (Left)-CONTROL OF HEMORRHAGE, GI TRACT, ENDOSCOPIC, BY CLIPPING OR OVERSEWING SCLEROTHERAPY   Subjective: Seen and examined Having lots of pain Wife at bedside Having difficulty hearing Patient complains of ongoing pain, discussed with [name] recommending transition to Dilaudid drip  and ok for [facility] whenever they have a bed on  oral antibiotics   Assessment & Plan:  Adenocarcinoma of colon with metastasis-diagnosed 01/07/2019 s/p colectomy hx and multiple chemo treatment Severe cancer-related pain-diffusedly in bones Lower extremity weakness/debility from cancer Goals of care/CODE STATUS: Patient has pain due to extensive bone metastasis and has deconditioning and weakness. At home on 30 mg bid ms contin and po dilaudid 4mg  q4h prn, Having worsening pain despite being on  MS Contin 100 mg bid, po decadron, and IV/PO Dilaudid prn> His life expectancy 2wks-month per oncology and [facility] hospice recommended and TOC consulted. Extensively discussed goals of care and encourageD DNR/DNI  Dr Truett Perna extensively discussed about hospice at this point of time but patient ad his wife was not electing to enter hospice. After further discussion given that patient has uncontrolled pain despite being on high dose of MS Contin and oral IV opiates, he continues to worsen clinically, due to the extensive metastatic disease after further discussion with patient and wife subsequently became agreeable for DNR> TOC consulted palliative care consulted and at this time looking into transfer to [facility] once they have bed Requesting Dilaudid drip for pain management  BRBPR-colorectal ABLA Acute on chronic anemia: B/L hB ~8.0, s/p 3 units of PRBC-and HB stable since. S/P EGD colonoscopy 4/1- prep was overall inadequate,oozing distal descending colon blood vessel which was endoscopically treated.  EGD showed grade B esophagitis and congested duodenal mucosa but no other signs of bleeding.  GI recommended to hold off anticoagulation at least 1 week and gi signed off Cont PPI BID, cont to hold aspirin. Recent Labs  Lab 07/06/23 0348 07/07/23 1151 07/08/23 0321 07/09/23 0259 07/10/23 0238  HGB 9.0* 8.9* 8.4* 8.8* 9.2*  HCT 28.7* 29.3* 27.2* 28.3* 29.5*   Acute on chronic thrombocytopenia: In the setting of  bony metastasis  and bone marrow infiltration. Discussed with Dr. Scherrie Curt  and not doing labs at this time-goal is for comfort and [facility] transfer. Recent Labs  Lab 07/06/23 0348 07/07/23 1151 07/08/23 0321 07/09/23 0259 07/10/23 0238  PLT 20* 20* 17* 15* 12*    Hyperbilirubinemia/Transaminitis Distended gallbladder with sludge and stones ? Acute cholecystitis: Transaminitis likely multifactorial mostly from bone metastasis - RUQ  US  showed distended gallbladder with sludge and stone, borderline wall thickening-surgery was consulted placed on IV antibiotics and HIDA scan ordered but given he is needing continuous narcotics along with scheduled MS Contin, HIDA scan will likely be false-extensively discussed with patient's oncologist, surgery-and given difficult situation at this time decided to continue antibiotics ,supportive care, pain management and HIDA scan discontinued per [facility]. he is a poor surgical candidate TB remains elevated alk phos trending up due to his metastasis ON Ceftriaxone and Flagyl> can change to oral antibiotics on transfer to [facility] Recent Labs  Lab 07/06/23 0348 07/07/23 0349 07/07/23 1151 07/08/23 0321 07/09/23 0259 07/10/23 0238  AST 119* 102*  --  106* 141* 136*  ALT 45* 42  --  41 44 42  ALKPHOS 648* 610*  --  835* 1,093* 1,449*  BILITOT 3.4* 3.2*  --  2.8* 3.2* 3.9*  PROT 5.0* 5.0*  --  4.6* 4.6* 4.4*  ALBUMIN 1.7* 1.6*  --  1.7* 1.7* 1.7*  PLT 20*  --  20* 17* 15* 12*   Diminished hearing sensation of the left/fuzziness/ringing since the left ear: Continue eardrops.  Having worsening of his diminished hearing bilaterally-discussed with Dr. 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 Having ringing sensation. MRI brain with and without contrast on 05/25/23 had 2 punctate foci of enhancement on the left parietal  occipital region could be metastasis,  IDDM type 2 with uncontrolled hyperglycemia: PTA on Plargine 41 u daily, metformin 1000 twice daily ON Semglee to 15 units and SSI WHILE HERE Recent Labs  Lab 07/10/23 0803 07/10/23 1157 07/10/23 1639 07/10/23 2027 07/11/23 0727  GLUCAP 289* 235* 281* 309* 149*    Hypocalcemia Hypomagnesemia Hyponatremia: Corrected calcium is stable due to hypoalbuminemia, magnesium replaced, sodium low   Hyperlipidemia Holding statin 2/2 transaminitis.  Essential hypertension BP stable on Toprol-XL 25 mg.Lisinopril on hold.  History of DVT Pradaxa remains on hold due to GI bleeding- on hold x 1 week- but given thrombocytopenia continue to hold for now  DVT prophylaxis: SCDs Start: 06/28/23 1229 Code Status:   Code Status: Do not attempt resuscitation (DNR) - Comfort care Family Communication: plan of care discussed with patient and his wife  Patient status is: Remains hospitalized because of severity of illness Level of care: Med-Surg  Dispo: The patient is from: Home w/ wife and kids.            Anticipated disposition: Anticipating hospice placement.  Objective: Vitals last 24 hrs: Vitals:   07/10/23 2031 07/10/23 2204 07/11/23 0430 07/11/23 0903  BP:  (!) 162/98 (!) 161/96   Pulse: 97 97 (!) 104   Resp: 20  20   Temp:   98.6 F (37 C)   TempSrc:   Oral   SpO2: 97%  97% 96%  Weight:      Height:      Weight change:   Physical Examination: General exam: alert awake, ill appearing HEENT:Oral  mucosa moist, Ear/Nose WNL grossly Respiratory system: Bilaterally clear BS,no use of accessory muscle Cardiovascular system: S1 & S2 +,No JVD. Gastrointestinal system: Abdomen soft tender abdomen,BS+ Nervous System: Alert, awake, moving all extremities,and following commands. Extremities: LE edema neg,distal peripheral pulses palpable and warm.  Skin: No rashes,no icterus. MSK: Normal muscle bulk,tone, power   Medications reviewed:  Scheduled  Meds:  sodium chloride   Intravenous Once   budesonide  0.25 mg Nebulization BID   carbamide peroxide  5 drop Left EAR BID   Chlorhexidine Gluconate Cloth  6 each Topical Daily   ciprofloxacin-hydrocortisone  3 drop Left EAR BID   dexamethasone  4 mg Oral Daily   insulin aspart  0-15 Units Subcutaneous TID WC & HS   insulin glargine-yfgn  10 Units Subcutaneous Daily   loratadine  10 mg Oral Daily   metoprolol succinate  25 mg Oral Daily   morphine  100 mg Oral Q12H   pantoprazole (PROTONIX) IV  40 mg Intravenous Q12H   senna-docusate  1 tablet Oral BID  Continuous Infusions:  cefTRIAXone (ROCEPHIN)  IV Stopped (07/10/23 1635)   HYDROmorphone 0.5 mg/hr (07/11/23 1153)   metronidazole 500 mg (07/11/23 1000)    Diet Order             Diet regular Room service appropriate? Yes; Fluid consistency: Thin  Diet effective now                  Intake/Output Summary (Last 24 hours) at 07/11/2023 1210 Last data filed at 07/11/2023 1610 Gross per 24 hour  Intake 870.03 ml  Output 375 ml  Net 495.03 ml   Net IO Since Admission: 6,802.8 mL [07/11/23 1210]  Wt Readings from Last 3 Encounters:  06/28/23 81.6 kg  06/17/23 81.6 kg  06/10/23 85 kg     Unresulted Labs (From admission, onward)     Start     Ordered   Signed and Held  Comprehensive metabolic panel  Tomorrow morning,   R       Question:  Specimen collection method  Answer:  IV Team=IV Team collect   Signed and Held   Signed and Held  CBC with Differential/Platelet  Tomorrow morning,   R       Question:  Specimen collection method  Answer:  IV Team=IV Team collect   Signed and Held          Data Reviewed: I have personally reviewed following labs and imaging studies ( see epic result tab) CBC: Recent Labs  Lab 07/06/23 0348 07/07/23 1151 07/08/23 0321 07/09/23 0259 07/10/23 0238  WBC 10.5 13.2* 13.2* 13.5* 14.0*  NEUTROABS  --  9.2* 10.7* 11.9* 10.2*  HGB 9.0* 8.9* 8.4* 8.8* 9.2*  HCT 28.7* 29.3* 27.2*  28.3* 29.5*  MCV 99.7 102.8* 102.6* 101.1* 103.1*  PLT 20* 20* 17* 15* 12*   CMP: Recent Labs  Lab 07/05/23 0348 07/06/23 0348 07/07/23 0349 07/08/23 0321 07/09/23 0259 07/10/23 0238  NA 131* 130* 130* 127* 130* 131*  K 4.0 3.8 4.0 4.2 4.3 4.5  CL 98 98 99 96* 99 100  CO2 23 22 23 22 24 24   GLUCOSE 126* 95 197* 164* 156* 252*  BUN 16 18 21* 22* 22* 20  CREATININE 0.43* 0.51* 0.70 0.60* 0.58* 0.48*  CALCIUM 7.4* 7.4* 7.2* 7.4* 7.5* 7.3*  MG 1.6* 1.6*  --   --   --   --    GFR: Estimated Creatinine Clearance: 109.8 mL/min (A) (by C-G  formula based on SCr of 0.48 mg/dL (L)). Recent Labs  Lab 07/06/23 0348 07/07/23 0349 07/08/23 0321 07/09/23 0259 07/10/23 0238  AST 119* 102* 106* 141* 136*  ALT 45* 42 41 44 42  ALKPHOS 648* 610* 835* 1,093* 1,449*  BILITOT 3.4* 3.2* 2.8* 3.2* 3.9*  PROT 5.0* 5.0* 4.6* 4.6* 4.4*  ALBUMIN 1.7* 1.6* 1.7* 1.7* 1.7*   Recent Labs  Lab 07/10/23 0803 07/10/23 1157 07/10/23 1639 07/10/23 2027 07/11/23 0727  GLUCAP 289* 235* 281* 309* 149*    Antimicrobials/Microbiology: Anti-infectives (From admission, onward)    Start     Dose/Rate Route Frequency Ordered Stop   07/09/23 1500  cefTRIAXone (ROCEPHIN) 2 g in sodium chloride 0.9 % 100 mL IVPB        2 g 200 mL/hr over 30 Minutes Intravenous Every 24 hours 07/09/23 1401 07/13/23 1459   07/04/23 1100  cefTRIAXone (ROCEPHIN) 2 g in sodium chloride 0.9 % 100 mL IVPB        2 g 200 mL/hr over 30 Minutes Intravenous Every 24 hours 07/04/23 1027 07/08/23 1338   07/04/23 1100  metroNIDAZOLE (FLAGYL) IVPB 500 mg        500 mg 100 mL/hr over 60 Minutes Intravenous Every 12 hours 07/04/23 1027           Component Value Date/Time   SDES ABSCESS 08/13/2021 1939   SPECREQUEST RIGHT GROIN ABSCESS 08/13/2021 1939   CULT  08/13/2021 1939    FEW ARCANOBACTERIUM HAEMOLYTICUM Standardized susceptibility testing for this organism is not available. MIXED ANAEROBIC FLORA PRESENT.  CALL LAB IF  FURTHER IID REQUIRED.    REPTSTATUS 08/18/2021 FINAL 08/13/2021 1939  Radiology Studies: No results found.  LOS: 13 days   Total time spent in review of labs and imaging, patient evaluation, formulation of plan, documentation and communication with patient/family:  50 minutes  Lesa Rape, MD [facility] 07/11/2023, 12:10 PM

## 2023-07-11 NOTE — Progress Notes (Signed)
 Maryan Smalling Room 1436  [facility] Liaison Note  Referral received from Palliative for family interest in [facility].   Met with patient and family in room to explain services and hospice philosophy and all questions answered.   [facility] is able to accept patient this afternoon once consents are complete.   RN staff, you may call report at any time to [facility] @ [phone], room is assigned when report is called.   Please leave IV intact and send completed DNR with patient.   Updated attending and [facility] manager via [employer].  Thank you for the opportunity to participate in this patient's care   Jacqlyn Matas, BSN, RN Hospice Nurse Liaison [phone]

## 2023-07-11 NOTE — Discharge Summary (Signed)
 Physician Discharge Summary  [name] YTK:[id number] DOB: [birth year] DOA: 06/28/2023  PCP: Joycelyn Rua, MD  Admit date: 06/28/2023 Discharge date: 07/11/2023 Recommendations for Outpatient Follow-up:  Follow up [facility] today  Discharge Dispo: [facility] Discharge Condition: Stable Code Status:   Code Status: Do not attempt resuscitation (DNR) - Comfort care Diet recommendation:  Diet Order             Diet regular Room service appropriate? Yes; Fluid consistency: Thin  Diet effective now                    Brief/Interim Summary: 58 year old with history of colon cancer on FOLFOX, Avastin follows Dr [name], insulin-dependent DM2, HTN, HLD, DVT on Pradaxa admitted to the hospital for bright red blood per rectum/acute blood loss anemia.  Recently treated for gluteal fold cellulitis.  Last dose of Pradaxa was the night before his hospitalization.  Admission hemoglobin was noted to be 6.5 therefore PRBC transfusion was ordered.  CT angio was negative for active bleeding but showed colonic wall thickening distal to prior anastomosis.  Colonoscopy prep was overall inadequate stool was noted in sigmoid colon.  There was oozing distal descending colon blood vessel which was endoscopically treated.  EGD showed grade B esophagitis and congested duodenal mucosa but no other signs of bleeding.  For now GI recommended holding off on anticoagulation for at least 1 week. Overall remains hemodynamically stable. Due to complaints of decreased hearing on left ear discussed with oncology question if he has metastatic spots at base of skull-advised hospice based upon his overall functional status and metastatic disease AND patient does not feel ready yet. Patient was having ongoing worsening pain in his hip bone back and also decreasing hearing sensation After extensive discussion with oncology palliative care patient wife and patient agreed for hospice [facility] evaluating He has been  approved for [facility]  Consultation: Gastroenterology [facility] Oncology Palliative Care   Procedures/testing: E4/1 : EGD/ COLONOSCOPY (Left)-CONTROL OF HEMORRHAGE, GI TRACT, ENDOSCOPIC, BY CLIPPING OR OVERSEWING SCLEROTHERAPY   Subjective: Seen and examined Having lots of pain Wife at bedside Having difficulty hearing Patient complains of ongoing pain, discussed with [name] recommending transition to Dilaudid drip  and ok for [facility] whenever they have a bed on oral antibiotics   Discharge Diagnoses:  Primary problem: Adenocarcinoma of colon with metastasis-diagnosed 01/07/2019 s/p colectomy hx and multiple chemo treatment Severe cancer-related pain-diffusedly in bones Lower extremity weakness/debility from cancer Goals of care/CODE STATUS: Patient has pain due to extensive bone metastasis and has deconditioning and weakness. At home on 30 mg bid ms contin and po dilaudid 4mg  q4h prn, Having worsening pain despite being on  MS Contin 100 mg bid, po decadron, and IV/PO Dilaudid prn> His life expectancy 2wks-month per oncology and [facility] hospice recommended and TOC consulted. Extensively discussed goals of care and encourageD DNR/DNI  Dr Truett Perna extensively discussed about hospice at this point of time but patient ad his wife was not electing to enter hospice. After further discussion given that patient has uncontrolled pain despite being on high dose of MS Contin and oral IV opiates, he continues to worsen clinically, due to the extensive metastatic disease after further discussion with patient and wife subsequently became agreeable for DNR> [facility] consulted palliative care consulted and at this time looking into transfer to [facility] once they have bed Requesting Dilaudid drip for pain management- started and continue on it as [facility].  BRBPR-colorectal ABLA Acute on  chronic anemia: B/L hB ~8.0, s/p 3 units of PRBC-and HB stable since. S/P EGD colonoscopy  4/1- prep was overall inadequate,oozing distal descending colon blood vessel which was endoscopically treated.  EGD showed grade B esophagitis and congested duodenal mucosa but no other signs of bleeding.  GI recommended to hold off anticoagulation at least 1 week and gi signed off Cont PPI, cont to hold aspirin. Recent Labs  Lab 07/06/23 0348 07/07/23 1151 07/08/23 0321 07/09/23 0259 07/10/23 0238  HGB 9.0* 8.9* 8.4* 8.8* 9.2*  HCT 28.7* 29.3* 27.2* 28.3* 29.5*   Acute on chronic thrombocytopenia: In the setting of bony metastasis and bone marrow infiltration. Discussed with Dr. Scherrie Curt  and not doing labs at this time-goal is for comfort and [facility] transfer. Recent Labs  Lab 07/06/23 0348 07/07/23 1151 07/08/23 0321 07/09/23 0259 07/10/23 0238  PLT 20* 20* 17* 15* 12*    Hyperbilirubinemia/Transaminitis Distended gallbladder with sludge and stones ? Acute cholecystitis: Transaminitis likely multifactorial mostly from bone metastasis - RUQ  US  showed distended gallbladder with sludge and stone, borderline wall thickening-surgery was consulted placed on IV antibiotics and HIDA scan ordered but given he is needing continuous narcotics along with scheduled MS Contin, HIDA scan will likely be false-extensively discussed with patient's oncologist, surgery-and given difficult situation at this time decided to continue antibiotics ,supportive care, pain management and HIDA scan discontinued per [facility]. he is a poor surgical candidate TB remains elevated alk phos trending up due to his metastasis ON Ceftriaxone and Flagyl> can change to oral antibiotics on transfer to [facility] Recent Labs  Lab 07/06/23 0348 07/07/23 0349 07/07/23 1151 07/08/23 0321 07/09/23 0259 07/10/23 0238  AST 119* 102*  --  106* 141* 136*  ALT 45* 42  --  41 44 42  ALKPHOS 648* 610*  --  835* 1,093* 1,449*  BILITOT 3.4* 3.2*  --  2.8* 3.2* 3.9*  PROT 5.0* 5.0*  --  4.6* 4.6* 4.4*  ALBUMIN 1.7* 1.6*   --  1.7* 1.7* 1.7*  PLT 20*  --  20* 17* 15* 12*   Diminished hearing sensation of the left/fuzziness/ringing since the left ear: Continue eardrops.  Having worsening of his diminished hearing bilaterally-discussed with Dr. 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 Having ringing sensation. MRI brain with and without contrast on 05/25/23 had 2 punctate foci of enhancement on the left parietal occipital region could be metastasis,  IDDM type 2 with uncontrolled hyperglycemia: PTA on Plargine 41 u daily, metformin 1000 twice daily ON Semglee to 15 units and SSI WHILE HERE Recent Labs  Lab 07/10/23 0803 07/10/23 1157 07/10/23 1639 07/10/23 2027 07/11/23 0727  GLUCAP 289* 235* 281* 309* 149*    Hypocalcemia Hypomagnesemia Hyponatremia: Corrected calcium is stable due to hypoalbuminemia, magnesium replaced, sodium low   Hyperlipidemia Holding statin 2/2 transaminitis.  Essential hypertension BP stable on Toprol-XL 25 mg.Lisinopril on hold.  History of DVT Pradaxa remains on hold due to GI bleeding- on hold x 1 week- but given thrombocytopenia continue to hold for now  Discharge Exam: Vitals:   07/11/23 0430 07/11/23 0903  BP: (!) 161/96   Pulse: (!) 104   Resp: 20   Temp: 98.6 F (37 C)   SpO2: 97% 96%   General: Pt is alert, awake, not in acute distress Cardiovascular: RRR, S1/S2 +, no rubs, no gallops  Respiratory: CTA bilaterally, no wheezing, no rhonchi Abdominal: Soft, NT, ND, bowel sounds + Extremities: no edema, no cyanosis  Discharge Instructions  Discharge Instructions     Discharge wound care:   Complete by: As directed    Cleanse buttocks (intact skin and ulcerations) with Vashe wound cleanser Timm Foot [phone]), do not rinse and allow to air dry.  Apply Xeroform gauze to wound beds daily and cover with silicone foam  or ABD pad and tape.   Increase activity slowly   Complete by: As directed       Allergies as of 07/11/2023       Reactions   Cat Dander Anaphylaxis   Empagliflozin Other (See Comments)   Recurrent UTI        Medication List     STOP taking these medications    aspirin EC 81 MG tablet   atorvastatin 10 MG tablet Commonly known as: LIPITOR   dabigatran 150 MG Caps capsule Commonly known as: PRADAXA   HYDROmorphone 4 MG tablet Commonly known as: DILAUDID   ibuprofen 200 MG tablet Commonly known as: ADVIL   lisinopril 5 MG tablet Commonly known as: ZESTRIL   pioglitazone 30 MG tablet Commonly known as: ACTOS   repaglinide 2 MG tablet Commonly known as: PRANDIN   Toujeo SoloStar 300 UNIT/ML Solostar Pen Generic drug: insulin glargine (1 Unit Dial) Replaced by: insulin glargine-yfgn 100 UNIT/ML injection   traZODone 50 MG tablet Commonly known as: DESYREL       TAKE these medications    acetaminophen 500 MG tablet Commonly known as: TYLENOL Take 1 tablet (500 mg total) by mouth daily as needed for mild pain.   albuterol 108 (90 Base) MCG/ACT inhaler Commonly known as: VENTOLIN HFA Inhale 1-2 puffs into the lungs every 6 (six) hours as needed for wheezing or shortness of breath.   calcium carbonate 750 MG chewable tablet Commonly known as: TUMS EX Chew 1,500 mg by mouth daily as needed for heartburn.   carbamide peroxide 6.5 % OTIC solution Commonly known as: DEBROX Place 5 drops into the left ear 2 (two) times daily.   ciprofloxacin 500 MG tablet Commonly known as: Cipro Take 1 tablet (500 mg total) by mouth 2 (two) times daily for 5 days.   ciprofloxacin-hydrocortisone OTIC suspension Commonly known as: CIPRO HC OTIC Place 3 drops into the left ear 2 (two) times daily.   dexamethasone 4 MG tablet Commonly known as: DECADRON Take 1 tablet (4 mg total) by mouth daily. Start on 05/15/2023   FISH OIL PO Take 1,000 mg by mouth 2 (two) times  daily.   HYDROmorphone HCl-NaCl 50MG /50ML Soln Commonly known as: DILAUDID Inject 0.5-4 mg/hr into the vein continuous.   insulin glargine-yfgn 100 UNIT/ML injection Commonly known as: SEMGLEE Inject 0.1 mLs (10 Units total) into the skin daily. Start taking on: July 12, 2023 Replaces: Toujeo SoloStar 300 UNIT/ML Solostar Pen   lidocaine-prilocaine cream Commonly known as: EMLA Apply 1 Application topically as needed (port irritation).   loratadine 10 MG tablet Commonly known as: CLARITIN Take 10 mg by mouth daily as needed for allergies.   metFORMIN 1000 MG tablet Commonly known as: GLUCOPHAGE Take 1 tablet (1,000 mg total) by mouth 2 (two) times daily.   metoprolol succinate 25 MG 24 hr tablet Commonly known as: TOPROL-XL Take 25 mg by mouth daily.   metroNIDAZOLE 500 MG tablet Commonly known as: Flagyl Take 1 tablet (500 mg total) by mouth 3 (three) times daily for 5 days.  mometasone 220 MCG/INH inhaler Commonly known as: ASMANEX Inhale 1 puff into the lungs daily.   morphine 100 MG 12 hr tablet Commonly known as: MS CONTIN Take 1 tablet (100 mg total) by mouth every 12 (twelve) hours. What changed:  medication strength how much to take   naloxone 4 MG/0.1ML Liqd nasal spray kit Commonly known as: NARCAN For use if Opoid overdose is suspected   ondansetron 4 MG tablet Commonly known as: ZOFRAN Take 1 tablet (4 mg total) by mouth every 6 (six) hours as needed for nausea.   ONE TOUCH ULTRA TEST test strip Generic drug: glucose blood 1 each by Other route as needed (GLUCOSE).   pantoprazole 40 MG tablet Commonly known as: Protonix Take 1 tablet (40 mg total) by mouth daily.   polyethylene glycol powder 17 GM/SCOOP powder Commonly known as: GLYCOLAX/MIRALAX Dissolve 17 g in liquid and take by mouth daily.   prochlorperazine 10 MG tablet Commonly known as: COMPAZINE Take 1 tablet (10 mg total) by mouth every 6 (six) hours as needed.   Stool  Softener/Laxative 50-8.6 MG tablet Generic drug: senna-docusate Take 1 tablet by mouth at bedtime.               Discharge Care Instructions  (From admission, onward)           Start     Ordered   07/11/23 0000  Discharge wound care:       Comments: Cleanse buttocks (intact skin and ulcerations) with Vashe wound cleanser Timm Foot [phone]), do not rinse and allow to air dry.  Apply Xeroform gauze to wound beds daily and cover with silicone foam or ABD pad and tape.   07/11/23 1705            Allergies  Allergen Reactions   Cat Dander Anaphylaxis   Empagliflozin Other (See Comments)    Recurrent UTI    The results of significant diagnostics from this hospitalization (including imaging, microbiology, ancillary and laboratory) are listed below for reference.    Microbiology: No results found for this or any previous visit (from the past 240 hours).  Procedures/Studies: US  ABDOMEN LIMITED RUQ (LIVER/GB) Result Date: 07/03/2023 CLINICAL DATA:  Transaminitis EXAM: ULTRASOUND ABDOMEN LIMITED RIGHT UPPER QUADRANT COMPARISON:  CT 06/28/2023 and older FINDINGS: Gallbladder: Distended gallbladder with sludge and stones. Borderline wall thickening of 3 mm. No sonographic Murphy's sign reported. Trace adjacent fluid suggested. Common bile duct: Diameter: 3 mm Liver: No focal lesion identified. Within normal limits in parenchymal echogenicity. Portal vein is patent on color Doppler imaging with normal direction of blood flow towards the liver. Other: Right pleural effusion. IMPRESSION: Distended gallbladder with sludge and stones. Borderline wall thickening. Question trace adjacent fluid. No ductal dilatation. There is further concern of acute cholecystitis a HIDA scan could be considered. Right pleural effusion. Electronically Signed   By: Adrianna Horde M.D.   On: 07/03/2023 16:02   CT ANGIO GI BLEED Result Date: 06/28/2023 CLINICAL DATA:  Lower GI bleeding. Mesenteric ischemia, acute.  Metastatic colon cancer EXAM: CTA ABDOMEN AND PELVIS WITHOUT AND WITH CONTRAST TECHNIQUE: Multidetector CT imaging of the abdomen and pelvis was performed using the standard protocol during bolus administration of intravenous contrast. Multiplanar reconstructed images and MIPs were obtained and reviewed to evaluate the vascular anatomy. RADIATION DOSE REDUCTION: This exam was performed according to the departmental dose-optimization program which includes automated exposure control, adjustment of the mA and/or kV according to patient size and/or use of iterative reconstruction technique. CONTRAST:  80mL OMNIPAQUE IOHEXOL 350 MG/ML SOLN COMPARISON:  06/04/2023 FINDINGS: VASCULAR Aorta: Normal caliber aorta without aneurysm, dissection, vasculitis or significant stenosis. Mild atherosclerosis. Celiac: Patent without evidence of aneurysm, dissection, vasculitis or significant stenosis. SMA: Patent without evidence of aneurysm, dissection, vasculitis or significant stenosis. Renals: The right renal artery is patent without evidence of aneurysm, dissection, vasculitis, fibromuscular dysplasia or significant stenosis. Patient has had a left nephrectomy. IMA: The origin and proximal aspect of the inferior mesenteric artery is not identified. This does appear to be reconstituted by branches of the superior mesenteric artery. Inflow: Patent without evidence of aneurysm, dissection, vasculitis or significant stenosis. Proximal Outflow: Bilateral common femoral and visualized portions of the superficial and profunda femoral arteries are patent without evidence of aneurysm, dissection, vasculitis or significant stenosis. Veins: Major venous structures are patent. Review of the MIP images confirms the above findings. NON-VASCULAR Lower chest: Small layering left pleural effusion. Hepatobiliary: 1.1 cm hypoattenuating lesion in the periphery of the right lower lobe, not definitively seen on the prior study (series 18, image 29.  Previously described subtly hypoattenuating lesions within the liver at the gallbladder fossa and IVC are difficult to characterize but do not appear appreciably changed. Atrophy of the left hepatic lobe. Right hepatic lobe measures 22 cm in length. Small stone within the gallbladder. No biliary dilatation. Pancreas: Grossly unremarkable. Spleen: Grossly unchanged appearance of the spleen with multiple heterogeneously areas of hypoattenuation including dominant lesion measuring approximately 11.2 x 8.5 cm. 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 Adrenals/Urinary Tract: Thickened appearance of the adrenal glands. Prior left nephrectomy. Unremarkable right kidney. No stone or hydronephrosis. Unremarkable urinary bladder. Stomach/Bowel: Tiny hiatal hernia. Stomach otherwise within normal limits. No abnormally dilated loops of bowel. Postsurgical changes from prior sigmoid resection and reanastomosis abnormal colonic wall thickening in the region of the distal sigmoid colon (series 18, image 66). There is also focal colonic wall thickening just distal to the anastomotic site (series 18, image 57). Normal appendix in the right lower quadrant (series 18, image 53). No evidence of abnormal contrast accumulation within the bowel on multiphasic imaging to localize site of active gastrointestinal bleeding. Lymphatic: Mildly prominent left external iliac chain lymph node measuring 9 mm (series 18, image 59), unchanged. No new or enlarging abdominopelvic lymph nodes. Reproductive: Prostatomegaly. Other: No ascites or pneumoperitoneum. Musculoskeletal: Extensive, widespread osseous metastatic disease, without evidence of significant progression compared to the prior study. Scoliotic spinal curvature. No pathologic fractures are seen. IMPRESSION: 1. The origin and proximal aspect of the inferior mesenteric artery is not identified. This does appear to be reconstituted by branches of the superior  mesenteric artery. Otherwise, no significant vascular abnormality. 2. No evidence of abnormal contrast accumulation within the bowel to localize site of active gastrointestinal bleeding. 3. Postsurgical changes from prior sigmoid resection and reanastomosis. Abnormal colonic wall thickening in the region of the distal sigmoid colon and just distal to the anastomotic site. Findings may represent sites of focal colitis, however recurrent malignancy is not excluded. Follow-up colonoscopy should be considered following the resolution of patient's acute symptoms. 4. New 1.1 cm hypoattenuating lesion in the periphery of the right hepatic lobe, not definitively seen on the prior study. This is concerning for a new site of metastatic disease. 5. Extensive, widespread osseous metastatic disease, without evidence of significant progression compared to the prior study. 6. Small layering left pleural effusion. 7. Cholelithiasis. 8. Aortic atherosclerosis. Electronically Signed   By: Leverne Reading D.O.   On: 06/28/2023  11:18    Labs: BNP (last 3 results) No results for input(s): "BNP" in the last 8760 hours. Basic Metabolic Panel: Recent Labs  Lab 07/05/23 0348 07/06/23 0348 07/07/23 0349 07/08/23 0321 07/09/23 0259 07/10/23 0238  NA 131* 130* 130* 127* 130* 131*  K 4.0 3.8 4.0 4.2 4.3 4.5  CL 98 98 99 96* 99 100  CO2 23 22 23 22 24 24   GLUCOSE 126* 95 197* 164* 156* 252*  BUN 16 18 21* 22* 22* 20  CREATININE 0.43* 0.51* 0.70 0.60* 0.58* 0.48*  CALCIUM 7.4* 7.4* 7.2* 7.4* 7.5* 7.3*  MG 1.6* 1.6*  --   --   --   --    Liver Function Tests: Recent Labs  Lab 07/06/23 0348 07/07/23 0349 07/08/23 0321 07/09/23 0259 07/10/23 0238  AST 119* 102* 106* 141* 136*  ALT 45* 42 41 44 42  ALKPHOS 648* 610* 835* 1,093* 1,449*  BILITOT 3.4* 3.2* 2.8* 3.2* 3.9*  PROT 5.0* 5.0* 4.6* 4.6* 4.4*  ALBUMIN 1.7* 1.6* 1.7* 1.7* 1.7*   No results for input(s): "LIPASE", "AMYLASE" in the last 168 hours. No  results for input(s): "AMMONIA" in the last 168 hours. CBC: Recent Labs  Lab 07/06/23 0348 07/07/23 1151 07/08/23 0321 07/09/23 0259 07/10/23 0238  WBC 10.5 13.2* 13.2* 13.5* 14.0*  NEUTROABS  --  9.2* 10.7* 11.9* 10.2*  HGB 9.0* 8.9* 8.4* 8.8* 9.2*  HCT 28.7* 29.3* 27.2* 28.3* 29.5*  MCV 99.7 102.8* 102.6* 101.1* 103.1*  PLT 20* 20* 17* 15* 12*  CBG: Recent Labs  Lab 07/10/23 1157 07/10/23 1639 07/10/23 2027 07/11/23 0727 07/11/23 1218  GLUCAP 235* 281* 309* 149* 247*   Urinalysis    Component Value Date/Time   COLORURINE YELLOW 07/22/2022 0822   APPEARANCEUR CLEAR 07/22/2022 0822   LABSPEC 1.026 07/22/2022 0822   PHURINE 6.0 07/22/2022 0822   GLUCOSEU NEGATIVE 07/22/2022 0822   HGBUR NEGATIVE 07/22/2022 0822   BILIRUBINUR NEGATIVE 07/22/2022 0822   KETONESUR NEGATIVE 07/22/2022 0822   PROTEINUR 30 (A) 06/10/2023 1555   UROBILINOGEN 0.2 08/17/2014 1831   NITRITE NEGATIVE 07/22/2022 0822   LEUKOCYTESUR NEGATIVE 07/22/2022 0822   Sepsis Labs Recent Labs  Lab 07/07/23 1151 07/08/23 0321 07/09/23 0259 07/10/23 0238  WBC 13.2* 13.2* 13.5* 14.0*   Microbiology No results found for this or any previous visit (from the past 240 hours).  Time coordinating discharge: 35 minutes  SIGNED: Lesa Rape, MD  [facility] 07/11/2023, 5:06 PM  If 7PM-7AM, please contact night-coverage [email or web address]

## 2023-07-11 NOTE — Plan of Care (Signed)
  Problem: Coping: Goal: Ability to adjust to condition or change in health will improve Outcome: Progressing   Problem: Fluid Volume: Goal: Ability to maintain a balanced intake and output will improve Outcome: Progressing   Problem: Education: Goal: Knowledge of General Education information will improve Description: Including pain rating scale, medication(s)/side effects and non-pharmacologic comfort measures Outcome: Progressing   Problem: Coping: Goal: Level of anxiety will decrease Outcome: Progressing

## 2023-07-11 NOTE — Plan of Care (Signed)
 Problem: Education: Goal: Ability to describe self-care measures that may prevent or decrease complications (Diabetes Survival Skills Education) will improve 07/11/2023 1823 by Coletta Davidson, RN Outcome: Adequate for Discharge 07/11/2023 1120 by Coletta Davidson, RN Outcome: Progressing Goal: Individualized Educational Video(s) 07/11/2023 1823 by Coletta Davidson, RN Outcome: Adequate for Discharge 07/11/2023 1120 by Coletta Davidson, RN Outcome: Progressing   Problem: Coping: Goal: Ability to adjust to condition or change in health will improve 07/11/2023 1823 by Coletta Davidson, RN Outcome: Adequate for Discharge 07/11/2023 1120 by Coletta Davidson, RN Outcome: Progressing   Problem: Fluid Volume: Goal: Ability to maintain a balanced intake and output will improve 07/11/2023 1823 by Coletta Davidson, RN Outcome: Adequate for Discharge 07/11/2023 1120 by Coletta Davidson, RN Outcome: Progressing   Problem: Health Behavior/Discharge Planning: Goal: Ability to identify and utilize available resources and services will improve 07/11/2023 1823 by Coletta Davidson, RN Outcome: Adequate for Discharge 07/11/2023 1120 by Coletta Davidson, RN Outcome: Progressing Goal: Ability to manage health-related needs will improve 07/11/2023 1823 by Coletta Davidson, RN Outcome: Adequate for Discharge 07/11/2023 1120 by Coletta Davidson, RN Outcome: Progressing   Problem: Metabolic: Goal: Ability to maintain appropriate glucose levels will improve 07/11/2023 1823 by Coletta Davidson, RN Outcome: Adequate for Discharge 07/11/2023 1120 by Coletta Davidson, RN Outcome: Progressing   Problem: Nutritional: Goal: Maintenance of adequate nutrition will improve 07/11/2023 1823 by Coletta Davidson, RN Outcome: Adequate for Discharge 07/11/2023 1120 by Coletta Davidson, RN Outcome: Progressing Goal: Progress toward achieving an optimal  weight will improve 07/11/2023 1823 by Coletta Davidson, RN Outcome: Adequate for Discharge 07/11/2023 1120 by Coletta Davidson, RN Outcome: Progressing   Problem: Skin Integrity: Goal: Risk for impaired skin integrity will decrease 07/11/2023 1823 by Coletta Davidson, RN Outcome: Adequate for Discharge 07/11/2023 1120 by Coletta Davidson, RN Outcome: Progressing   Problem: Tissue Perfusion: Goal: Adequacy of tissue perfusion will improve 07/11/2023 1823 by Coletta Davidson, RN Outcome: Adequate for Discharge 07/11/2023 1120 by Coletta Davidson, RN Outcome: Progressing   Problem: Education: Goal: Knowledge of General Education information will improve Description: Including pain rating scale, medication(s)/side effects and non-pharmacologic comfort measures 07/11/2023 1823 by Coletta Davidson, RN Outcome: Adequate for Discharge 07/11/2023 1120 by Coletta Davidson, RN Outcome: Progressing   Problem: Health Behavior/Discharge Planning: Goal: Ability to manage health-related needs will improve 07/11/2023 1823 by Coletta Davidson, RN Outcome: Adequate for Discharge 07/11/2023 1120 by Coletta Davidson, RN Outcome: Progressing   Problem: Clinical Measurements: Goal: Ability to maintain clinical measurements within normal limits will improve 07/11/2023 1823 by Coletta Davidson, RN Outcome: Adequate for Discharge 07/11/2023 1120 by Coletta Davidson, RN Outcome: Progressing Goal: Will remain free from infection 07/11/2023 1823 by Coletta Davidson, RN Outcome: Adequate for Discharge 07/11/2023 1120 by Coletta Davidson, RN Outcome: Progressing Goal: Diagnostic test results will improve 07/11/2023 1823 by Coletta Davidson, RN Outcome: Adequate for Discharge 07/11/2023 1120 by Coletta Davidson, RN Outcome: Progressing Goal: Respiratory complications will improve 07/11/2023 1823 by Coletta Davidson, RN Outcome: Adequate for  Discharge 07/11/2023 1120 by Coletta Davidson, RN Outcome: Progressing Goal: Cardiovascular complication will be avoided 07/11/2023 1823 by Coletta Davidson, RN Outcome: Adequate for Discharge 07/11/2023 1120 by Coletta Davidson, RN Outcome: Progressing   Problem: Activity: Goal: Risk for activity intolerance will decrease 07/11/2023 1823 by Coletta Davidson, RN Outcome: Adequate for Discharge  07/11/2023 1120 by Coletta Davidson, RN Outcome: Progressing   Problem: Nutrition: Goal: Adequate nutrition will be maintained 07/11/2023 1823 by Coletta Davidson, RN Outcome: Adequate for Discharge 07/11/2023 1120 by Coletta Davidson, RN Outcome: Progressing   Problem: Coping: Goal: Level of anxiety will decrease 07/11/2023 1823 by Coletta Davidson, RN Outcome: Adequate for Discharge 07/11/2023 1120 by Coletta Davidson, RN Outcome: Progressing   Problem: Elimination: Goal: Will not experience complications related to bowel motility 07/11/2023 1823 by Coletta Davidson, RN Outcome: Adequate for Discharge 07/11/2023 1120 by Coletta Davidson, RN Outcome: Progressing Goal: Will not experience complications related to urinary retention 07/11/2023 1823 by Coletta Davidson, RN Outcome: Adequate for Discharge 07/11/2023 1120 by Coletta Davidson, RN Outcome: Progressing   Problem: Pain Managment: Goal: General experience of comfort will improve and/or be controlled 07/11/2023 1823 by Coletta Davidson, RN Outcome: Adequate for Discharge 07/11/2023 1120 by Coletta Davidson, RN Outcome: Progressing   Problem: Safety: Goal: Ability to remain free from injury will improve 07/11/2023 1823 by Coletta Davidson, RN Outcome: Adequate for Discharge 07/11/2023 1120 by Coletta Davidson, RN Outcome: Progressing   Problem: Skin Integrity: Goal: Risk for impaired skin integrity will decrease 07/11/2023 1823 by Coletta Davidson, RN Outcome:  Adequate for Discharge 07/11/2023 1120 by Coletta Davidson, RN Outcome: Progressing

## 2023-07-11 NOTE — Plan of Care (Signed)
 Problem: Education: Goal: Ability to describe self-care measures that may prevent or decrease complications (Diabetes Survival Skills Education) will improve 07/11/2023 1823 by Coletta Davidson, RN Outcome: Adequate for Discharge 07/11/2023 1823 by Coletta Davidson, RN Outcome: Adequate for Discharge 07/11/2023 1120 by Coletta Davidson, RN Outcome: Progressing Goal: Individualized Educational Video(s) 07/11/2023 1823 by Coletta Davidson, RN Outcome: Adequate for Discharge 07/11/2023 1823 by Coletta Davidson, RN Outcome: Adequate for Discharge 07/11/2023 1120 by Coletta Davidson, RN Outcome: Progressing   Problem: Coping: Goal: Ability to adjust to condition or change in health will improve 07/11/2023 1823 by Coletta Davidson, RN Outcome: Adequate for Discharge 07/11/2023 1823 by Coletta Davidson, RN Outcome: Adequate for Discharge 07/11/2023 1120 by Coletta Davidson, RN Outcome: Progressing   Problem: Fluid Volume: Goal: Ability to maintain a balanced intake and output will improve 07/11/2023 1823 by Coletta Davidson, RN Outcome: Adequate for Discharge 07/11/2023 1823 by Coletta Davidson, RN Outcome: Adequate for Discharge 07/11/2023 1120 by Coletta Davidson, RN Outcome: Progressing   Problem: Health Behavior/Discharge Planning: Goal: Ability to identify and utilize available resources and services will improve 07/11/2023 1823 by Coletta Davidson, RN Outcome: Adequate for Discharge 07/11/2023 1823 by Coletta Davidson, RN Outcome: Adequate for Discharge 07/11/2023 1120 by Coletta Davidson, RN Outcome: Progressing Goal: Ability to manage health-related needs will improve 07/11/2023 1823 by Coletta Davidson, RN Outcome: Adequate for Discharge 07/11/2023 1823 by Coletta Davidson, RN Outcome: Adequate for Discharge 07/11/2023 1120 by Coletta Davidson, RN Outcome: Progressing   Problem: Metabolic: Goal: Ability to  maintain appropriate glucose levels will improve 07/11/2023 1823 by Coletta Davidson, RN Outcome: Adequate for Discharge 07/11/2023 1823 by Coletta Davidson, RN Outcome: Adequate for Discharge 07/11/2023 1120 by Coletta Davidson, RN Outcome: Progressing   Problem: Nutritional: Goal: Maintenance of adequate nutrition will improve 07/11/2023 1823 by Coletta Davidson, RN Outcome: Adequate for Discharge 07/11/2023 1823 by Coletta Davidson, RN Outcome: Adequate for Discharge 07/11/2023 1120 by Coletta Davidson, RN Outcome: Progressing Goal: Progress toward achieving an optimal weight will improve 07/11/2023 1823 by Coletta Davidson, RN Outcome: Adequate for Discharge 07/11/2023 1823 by Coletta Davidson, RN Outcome: Adequate for Discharge 07/11/2023 1120 by Coletta Davidson, RN Outcome: Progressing   Problem: Skin Integrity: Goal: Risk for impaired skin integrity will decrease 07/11/2023 1823 by Coletta Davidson, RN Outcome: Adequate for Discharge 07/11/2023 1823 by Coletta Davidson, RN Outcome: Adequate for Discharge 07/11/2023 1120 by Coletta Davidson, RN Outcome: Progressing   Problem: Tissue Perfusion: Goal: Adequacy of tissue perfusion will improve 07/11/2023 1823 by Coletta Davidson, RN Outcome: Adequate for Discharge 07/11/2023 1823 by Coletta Davidson, RN Outcome: Adequate for Discharge 07/11/2023 1120 by Coletta Davidson, RN Outcome: Progressing   Problem: Education: Goal: Knowledge of General Education information will improve Description: Including pain rating scale, medication(s)/side effects and non-pharmacologic comfort measures 07/11/2023 1823 by Coletta Davidson, RN Outcome: Adequate for Discharge 07/11/2023 1823 by Coletta Davidson, RN Outcome: Adequate for Discharge 07/11/2023 1120 by Coletta Davidson, RN Outcome: Progressing   Problem: Health Behavior/Discharge Planning: Goal: Ability to manage  health-related needs will improve 07/11/2023 1823 by Coletta Davidson, RN Outcome: Adequate for Discharge 07/11/2023 1823 by Coletta Davidson, RN Outcome: Adequate for Discharge 07/11/2023 1120 by Coletta Davidson, RN Outcome: Progressing   Problem: Clinical Measurements: Goal: Ability to maintain clinical measurements within normal limits will improve 07/11/2023 1823  by Coletta Davidson, RN Outcome: Adequate for Discharge 07/11/2023 1823 by Coletta Davidson, RN Outcome: Adequate for Discharge 07/11/2023 1120 by Coletta Davidson, RN Outcome: Progressing Goal: Will remain free from infection 07/11/2023 1823 by Coletta Davidson, RN Outcome: Adequate for Discharge 07/11/2023 1823 by Coletta Davidson, RN Outcome: Adequate for Discharge 07/11/2023 1120 by Coletta Davidson, RN Outcome: Progressing Goal: Diagnostic test results will improve 07/11/2023 1823 by Coletta Davidson, RN Outcome: Adequate for Discharge 07/11/2023 1823 by Coletta Davidson, RN Outcome: Adequate for Discharge 07/11/2023 1120 by Coletta Davidson, RN Outcome: Progressing Goal: Respiratory complications will improve 07/11/2023 1823 by Coletta Davidson, RN Outcome: Adequate for Discharge 07/11/2023 1823 by Coletta Davidson, RN Outcome: Adequate for Discharge 07/11/2023 1120 by Coletta Davidson, RN Outcome: Progressing Goal: Cardiovascular complication will be avoided 07/11/2023 1823 by Coletta Davidson, RN Outcome: Adequate for Discharge 07/11/2023 1823 by Coletta Davidson, RN Outcome: Adequate for Discharge 07/11/2023 1120 by Coletta Davidson, RN Outcome: Progressing   Problem: Activity: Goal: Risk for activity intolerance will decrease 07/11/2023 1823 by Coletta Davidson, RN Outcome: Adequate for Discharge 07/11/2023 1823 by Coletta Davidson, RN Outcome: Adequate for Discharge 07/11/2023 1120 by Coletta Davidson, RN Outcome: Progressing    Problem: Nutrition: Goal: Adequate nutrition will be maintained 07/11/2023 1823 by Coletta Davidson, RN Outcome: Adequate for Discharge 07/11/2023 1823 by Coletta Davidson, RN Outcome: Adequate for Discharge 07/11/2023 1120 by Coletta Davidson, RN Outcome: Progressing   Problem: Coping: Goal: Level of anxiety will decrease 07/11/2023 1823 by Coletta Davidson, RN Outcome: Adequate for Discharge 07/11/2023 1823 by Coletta Davidson, RN Outcome: Adequate for Discharge 07/11/2023 1120 by Coletta Davidson, RN Outcome: Progressing   Problem: Elimination: Goal: Will not experience complications related to bowel motility 07/11/2023 1823 by Coletta Davidson, RN Outcome: Adequate for Discharge 07/11/2023 1823 by Coletta Davidson, RN Outcome: Adequate for Discharge 07/11/2023 1120 by Coletta Davidson, RN Outcome: Progressing Goal: Will not experience complications related to urinary retention 07/11/2023 1823 by Coletta Davidson, RN Outcome: Adequate for Discharge 07/11/2023 1823 by Coletta Davidson, RN Outcome: Adequate for Discharge 07/11/2023 1120 by Coletta Davidson, RN Outcome: Progressing   Problem: Pain Managment: Goal: General experience of comfort will improve and/or be controlled 07/11/2023 1823 by Coletta Davidson, RN Outcome: Adequate for Discharge 07/11/2023 1823 by Coletta Davidson, RN Outcome: Adequate for Discharge 07/11/2023 1120 by Coletta Davidson, RN Outcome: Progressing   Problem: Safety: Goal: Ability to remain free from injury will improve 07/11/2023 1823 by Coletta Davidson, RN Outcome: Adequate for Discharge 07/11/2023 1823 by Coletta Davidson, RN Outcome: Adequate for Discharge 07/11/2023 1120 by Coletta Davidson, RN Outcome: Progressing   Problem: Skin Integrity: Goal: Risk for impaired skin integrity will decrease 07/11/2023 1823 by Coletta Davidson, RN Outcome: Adequate for  Discharge 07/11/2023 1823 by Coletta Davidson, RN Outcome: Adequate for Discharge 07/11/2023 1120 by Coletta Davidson, RN Outcome: Progressing

## 2023-07-11 NOTE — Plan of Care (Addendum)
 Communicating with patient via whiteboard in room.   Problem: Education: Goal: Ability to describe self-care measures that may prevent or decrease complications (Diabetes Survival Skills Education) will improve Outcome: Progressing Goal: Individualized Educational Video(s) Outcome: Progressing   Problem: Coping: Goal: Ability to adjust to condition or change in health will improve Outcome: Progressing   Problem: Fluid Volume: Goal: Ability to maintain a balanced intake and output will improve Outcome: Progressing   Problem: Health Behavior/Discharge Planning: Goal: Ability to identify and utilize available resources and services will improve Outcome: Progressing Goal: Ability to manage health-related needs will improve Outcome: Progressing   Problem: Metabolic: Goal: Ability to maintain appropriate glucose levels will improve Outcome: Progressing   Problem: Nutritional: Goal: Maintenance of adequate nutrition will improve Outcome: Progressing Goal: Progress toward achieving an optimal weight will improve Outcome: Progressing   Problem: Skin Integrity: Goal: Risk for impaired skin integrity will decrease Outcome: Progressing   Problem: Tissue Perfusion: Goal: Adequacy of tissue perfusion will improve Outcome: Progressing   Problem: Education: Goal: Knowledge of General Education information will improve Description: Including pain rating scale, medication(s)/side effects and non-pharmacologic comfort measures Outcome: Progressing   Problem: Health Behavior/Discharge Planning: Goal: Ability to manage health-related needs will improve Outcome: Progressing   Problem: Clinical Measurements: Goal: Ability to maintain clinical measurements within normal limits will improve Outcome: Progressing Goal: Will remain free from infection Outcome: Progressing Goal: Diagnostic test results will improve Outcome: Progressing Goal: Respiratory complications will improve Outcome:  Progressing Goal: Cardiovascular complication will be avoided Outcome: Progressing   Problem: Activity: Goal: Risk for activity intolerance will decrease Outcome: Progressing   Problem: Nutrition: Goal: Adequate nutrition will be maintained Outcome: Progressing   Problem: Coping: Goal: Level of anxiety will decrease Outcome: Progressing   Problem: Elimination: Goal: Will not experience complications related to bowel motility Outcome: Progressing Goal: Will not experience complications related to urinary retention Outcome: Progressing   Problem: Pain Managment: Goal: General experience of comfort will improve and/or be controlled Outcome: Progressing   Problem: Safety: Goal: Ability to remain free from injury will improve Outcome: Progressing   Problem: Skin Integrity: Goal: Risk for impaired skin integrity will decrease Outcome: Progressing

## 2023-07-11 NOTE — Progress Notes (Signed)
 Palliative Medicine Progress Note   Patient Name: [name]       Date: 07/11/2023 DOB: [birth year]  Age: 58 y.o. MRN#: [id number] Attending Physician: Lesa Rape, MD Primary Care Physician: Wyn Heater, MD Admit Date: 06/28/2023  Reason for Consultation/Follow-up: {Reason for Consult:23484}  HPI/Patient Profile: 58 y.o. 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  Hemoglobin on admission was 6.5 and PRBC transfusion was ordered.  CT angio was negative for active bleeding but showed colonic wall thickening distal to prior anastomosis. Colonoscopy and EGD done 06/30/23. Colonoscopy prep was inadequate. Mucosal ulceration in the distal descending colon, visible oozing blood vessel endoscopically treated. EGD showed esophagitis with no other signs of bleeding.  Hospitalization complicated by hyperbilirubinemia, transaminitis, and possible cholecystitis.   Palliative Medicine has been consulted for goals of care and "uncontrolled pain".  Subjective: Chart reviewed and update received from RN.   Due to patient's hearing loss, family is communicating with patient using a white board.   I spoke with wife/[name] regarding scope of care and potential transfer to inpatient hospice. She expresses preference for [facility] and has already spoke with their liaison, Melissa.   Objective:  Physical Exam Vitals reviewed.  Constitutional:      General: He is not in acute distress.    Appearance: He is ill-appearing.                LBM: Last BM Date : 07/07/23     Palliative Assessment/Data: ***     Palliative Medicine  Assessment & Plan   Assessment: Principal Problem:   Lower GI bleed    Recommendations/Plan: Continue current supportive interventions Continue MS Contin 100 mg every 12 hours (if needed, can consider increasing to 100 mg every 8 hours) Continue prn IV and PO dilaudid PMT will follow-up tomorrow  Goals of Care and Additional Recommendations: Limitations on Scope of Treatment: {Recommended Scope and Preferences:21019}  Code Status:   Prognosis:  {Palliative Care Prognosis:23504}  Discharge Planning: {Palliative dispostion:23505}  Care plan was discussed with ***  Thank you for allowing the Palliative Medicine Team to assist in the care of this patient.   ***   Shiann Kam B  Giulio Bertino, NP   Please contact Palliative Medicine Team phone at [phone] for questions and concerns.  For individual providers, please see AMION.

## 2023-07-11 NOTE — Progress Notes (Signed)
 Hydromorphone drip stopped, patient transferred to hospice [facility]). Wasted 29 mls from IV bag with Abby, Consulting civil engineer.

## 2023-07-12 NOTE — Progress Notes (Signed)
 Palliative Medicine Progress Note   Patient Name: [name]       Date: 07/12/2023 DOB: Nov 08, 1965  Age: 58 y.o. MRN#: [id number] Attending Physician: No att. providers found Primary Care Physician: Wyn Heater, MD Admit Date: 06/28/2023   HPI/Patient Profile: 58 y.o. 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  Hemoglobin on admission was 6.5 and PRBC transfusion was ordered.  CT angio was negative for active bleeding but showed colonic wall thickening distal to prior anastomosis. Colonoscopy and EGD done 06/30/23. Colonoscopy prep was inadequate. Mucosal ulceration in the distal descending colon, visible oozing blood vessel endoscopically treated. EGD showed esophagitis with no other signs of bleeding.  Hospitalization complicated by hyperbilirubinemia, transaminitis, and possible cholecystitis.   Palliative Medicine has been consulted for goals of care and "uncontrolled pain".   Subjective: Chart reviewed. Update received from RN. Patient assessed at bedside. He is reporting his pain is not well-controlled, and is agreeable to starting a continuous opioid infusion.   I was later notified that patient has been approved for [facility] and there is a bed available today. They are not able to have a dilaudid infusion delivered until Monday; plan until then is to administer IV push dilaudid every 2 hours.   Discussed with RN regarding administering a bolus dose of dilaudid prior to transport.   I spoke with with wife/[name] by phone. Questions/concerns were addressed. Emotional support provided.   Additional time spent  coordinating care and discussing plan of care with attending MD, [facility] team, RN and hospice liaison.    Objective:  Physical Exam Vitals reviewed.  Constitutional:      General: He is awake. He is not in acute distress.    Appearance: He is ill-appearing.  HENT:     Right Ear: Decreased hearing noted.     Left Ear: Decreased hearing noted.  Pulmonary:     Effort: Pulmonary effort is normal.  Neurological:     Mental Status: He is oriented to person, place, and time.              Palliative Medicine Assessment & Plan   Assessment: Principal Problem:   Lower GI bleed  Recommendations/Plan: Comfort-focused care Gold DNR form signed and placed on chart Start dilaudid infusion, initiate at 0.5 mg/hr and titrate by 0.5 mg/hr to maximum dose of 4 mg/hr  Dilaudid bolus 0.25-1 mg every 30 min PRN to alleviate signs and symptoms of distress  Continue MS Contin 100 mg every 12 hours Pending discharge to [facility] today   Code Status: DNR - Limited   Prognosis:  < 2 weeks  Discharge Planning: Hospice facility   Thank you for allowing the Palliative Medicine Team to assist in the care of this patient.   Time: 65 minutes   Wynetta Heckle, NP   Please contact Palliative Medicine Team phone at [phone] for questions and concerns.  For individual providers, please see AMION.

## 2023-07-13 ENCOUNTER — Ambulatory Visit: Admitting: Oncology

## 2023-07-13 ENCOUNTER — Ambulatory Visit

## 2023-07-13 ENCOUNTER — Other Ambulatory Visit

## 2023-07-15 ENCOUNTER — Telehealth: Payer: Self-pay

## 2023-07-15 ENCOUNTER — Encounter: Admitting: Nutrition

## 2023-07-15 ENCOUNTER — Encounter

## 2023-07-15 NOTE — Telephone Encounter (Signed)
 Maria Haematologist from [facility] called to state that this patient was pronounced deceased on 07/21/2023 at 9:45 pm. Will make Dr. Scherrie Curt aware.

## 2023-07-30 DEATH — deceased

## 2023-09-16 ENCOUNTER — Ambulatory Visit: Admitting: Neurology
